# Patient Record
Sex: Male | Born: 1945
Health system: Southern US, Community
[De-identification: ages and names within clinical notes are randomized; demographics above are authoritative.]

## PROBLEM LIST (undated history)

## (undated) DIAGNOSIS — K5790 Diverticulosis of intestine, part unspecified, without perforation or abscess without bleeding: Secondary | ICD-10-CM

## (undated) DIAGNOSIS — I10 Essential (primary) hypertension: Secondary | ICD-10-CM

## (undated) DIAGNOSIS — Z9489 Other transplanted organ and tissue status: Secondary | ICD-10-CM

## (undated) DIAGNOSIS — I4510 Unspecified right bundle-branch block: Secondary | ICD-10-CM

## (undated) DIAGNOSIS — N182 Chronic kidney disease, stage 2 (mild): Secondary | ICD-10-CM

## (undated) DIAGNOSIS — T8609 Other complications of bone marrow transplant: Secondary | ICD-10-CM

## (undated) DIAGNOSIS — R768 Other specified abnormal immunological findings in serum: Secondary | ICD-10-CM

## (undated) DIAGNOSIS — E785 Hyperlipidemia, unspecified: Secondary | ICD-10-CM

## (undated) DIAGNOSIS — G729 Myopathy, unspecified: Secondary | ICD-10-CM

## (undated) DIAGNOSIS — E099 Drug or chemical induced diabetes mellitus without complications: Secondary | ICD-10-CM

## (undated) DIAGNOSIS — G252 Other specified forms of tremor: Secondary | ICD-10-CM

## (undated) DIAGNOSIS — M25552 Pain in left hip: Secondary | ICD-10-CM

## (undated) DIAGNOSIS — E669 Obesity, unspecified: Secondary | ICD-10-CM

## (undated) DIAGNOSIS — D89811 Chronic graft-versus-host disease: Secondary | ICD-10-CM

## (undated) DIAGNOSIS — K449 Diaphragmatic hernia without obstruction or gangrene: Secondary | ICD-10-CM

## (undated) DIAGNOSIS — C9110 Chronic lymphocytic leukemia of B-cell type not having achieved remission: Secondary | ICD-10-CM

## (undated) DIAGNOSIS — D61818 Other pancytopenia: Secondary | ICD-10-CM

## (undated) DIAGNOSIS — R197 Diarrhea, unspecified: Secondary | ICD-10-CM

## (undated) DIAGNOSIS — R894 Abnormal immunological findings in specimens from other organs, systems and tissues: Secondary | ICD-10-CM

## (undated) DIAGNOSIS — R21 Rash and other nonspecific skin eruption: Secondary | ICD-10-CM

## (undated) DIAGNOSIS — T380X5A Adverse effect of glucocorticoids and synthetic analogues, initial encounter: Secondary | ICD-10-CM

## (undated) DIAGNOSIS — I4891 Unspecified atrial fibrillation: Secondary | ICD-10-CM

## (undated) HISTORY — DX: Chronic lymphocytic leukemia of B-cell type not having achieved remission: C91.10

## (undated) HISTORY — PX: TONSILLECTOMY AND ADENOIDECTOMY: SUR1326

## (undated) HISTORY — DX: Essential (primary) hypertension: I10

## (undated) HISTORY — DX: Hyperlipidemia, unspecified: E78.5

## (undated) HISTORY — DX: Other pancytopenia: D61.818

## (undated) HISTORY — DX: Unspecified atrial fibrillation: I48.91

## (undated) HISTORY — DX: Diaphragmatic hernia without obstruction or gangrene: K44.9

## (undated) HISTORY — DX: Abnormal immunological findings in specimens from other organs, systems and tissues: R89.4

## (undated) HISTORY — DX: Other specified forms of tremor: G25.2

## (undated) HISTORY — DX: Adverse effect of glucocorticoids and synthetic analogues, initial encounter: T38.0X5A

## (undated) HISTORY — DX: Pain in left hip: M25.552

## (undated) HISTORY — DX: Other specified abnormal immunological findings in serum: R76.8

## (undated) HISTORY — PX: BONE MARROW TRANSPLANT: SHX200

## (undated) HISTORY — DX: Drug or chemical induced diabetes mellitus without complications: E09.9

## (undated) HISTORY — DX: Hypomagnesemia: E83.42

## (undated) HISTORY — DX: Chronic kidney disease, stage 2 (mild): N18.2

## (undated) HISTORY — DX: Myopathy, unspecified: G72.9

## (undated) HISTORY — DX: Diverticulosis of intestine, part unspecified, without perforation or abscess without bleeding: K57.90

## (undated) HISTORY — DX: Unspecified right bundle-branch block: I45.10

## (undated) HISTORY — DX: Other transplanted organ and tissue status: Z94.89

## (undated) HISTORY — DX: Obesity, unspecified: E66.9

---

## 2006-10-29 ENCOUNTER — Ambulatory Visit: Payer: Self-pay | Admitting: Oncology

## 2006-11-27 ENCOUNTER — Encounter: Payer: Self-pay | Admitting: Oncology

## 2006-11-27 ENCOUNTER — Other Ambulatory Visit: Admission: RE | Admit: 2006-11-27 | Discharge: 2006-11-27 | Payer: Self-pay | Admitting: Oncology

## 2006-11-27 LAB — CBC WITH DIFFERENTIAL/PLATELET
EOS%: 1.6 % (ref 0.0–7.0)
LYMPH%: 12.8 % — ABNORMAL LOW (ref 14.0–48.0)
MCH: 30.1 pg (ref 28.0–33.4)
MCHC: 35 g/dL (ref 32.0–35.9)
MCV: 86.1 fL (ref 81.6–98.0)
MONO%: 12.7 % (ref 0.0–13.0)
Platelets: 117 10*3/uL — ABNORMAL LOW (ref 145–400)
RBC: 4.57 10*6/uL (ref 4.20–5.71)
RDW: 11.8 % (ref 11.2–14.6)

## 2006-11-27 LAB — MORPHOLOGY: PLT EST: DECREASED

## 2006-12-02 LAB — COMPREHENSIVE METABOLIC PANEL
AST: 16 U/L (ref 0–37)
Alkaline Phosphatase: 69 U/L (ref 39–117)
BUN: 16 mg/dL (ref 6–23)
Creatinine, Ser: 0.78 mg/dL (ref 0.40–1.50)

## 2006-12-02 LAB — LACTATE DEHYDROGENASE: LDH: 167 U/L (ref 94–250)

## 2006-12-02 LAB — BETA 2 MICROGLOBULIN, SERUM: Beta-2 Microglobulin: 1.9 mg/L — ABNORMAL HIGH (ref 1.01–1.73)

## 2006-12-02 LAB — IMMUNOFIXATION ELECTROPHORESIS
IgA: 44 mg/dL — ABNORMAL LOW (ref 68–378)
IgM, Serum: 11 mg/dL — ABNORMAL LOW (ref 60–263)

## 2007-03-26 ENCOUNTER — Ambulatory Visit: Payer: Self-pay | Admitting: Oncology

## 2007-03-31 LAB — CBC WITH DIFFERENTIAL/PLATELET
Eosinophils Absolute: 0.1 10*3/uL (ref 0.0–0.5)
MONO#: 0.5 10*3/uL (ref 0.1–0.9)
MONO%: 10 % (ref 0.0–13.0)
NEUT#: 3.4 10*3/uL (ref 1.5–6.5)
RBC: 4.48 10*6/uL (ref 4.20–5.71)
RDW: 11.4 % (ref 11.2–14.6)
WBC: 4.7 10*3/uL (ref 4.0–10.0)
lymph#: 0.8 10*3/uL — ABNORMAL LOW (ref 0.9–3.3)

## 2007-11-14 ENCOUNTER — Ambulatory Visit: Payer: Self-pay | Admitting: Oncology

## 2007-11-17 LAB — CBC WITH DIFFERENTIAL/PLATELET
Eosinophils Absolute: 0.1 10*3/uL (ref 0.0–0.5)
HCT: 39.6 % (ref 38.7–49.9)
LYMPH%: 18.4 % (ref 14.0–48.0)
MCV: 85.7 fL (ref 81.6–98.0)
MONO#: 0.5 10*3/uL (ref 0.1–0.9)
MONO%: 9.3 % (ref 0.0–13.0)
NEUT#: 3.8 10*3/uL (ref 1.5–6.5)
NEUT%: 70.5 % (ref 40.0–75.0)
Platelets: 135 10*3/uL — ABNORMAL LOW (ref 145–400)
WBC: 5.4 10*3/uL (ref 4.0–10.0)

## 2007-11-23 LAB — LACTATE DEHYDROGENASE: LDH: 117 U/L (ref 94–250)

## 2007-11-23 LAB — COMPREHENSIVE METABOLIC PANEL
BUN: 19 mg/dL (ref 6–23)
CO2: 23 mEq/L (ref 19–32)
Creatinine, Ser: 0.89 mg/dL (ref 0.40–1.50)
Glucose, Bld: 99 mg/dL (ref 70–99)
Total Bilirubin: 0.7 mg/dL (ref 0.3–1.2)
Total Protein: 6.4 g/dL (ref 6.0–8.3)

## 2007-11-23 LAB — IMMUNOFIXATION ELECTROPHORESIS
IgA: 53 mg/dL — ABNORMAL LOW (ref 68–378)
IgG (Immunoglobin G), Serum: 584 mg/dL — ABNORMAL LOW (ref 694–1618)
IgM, Serum: 79 mg/dL (ref 60–263)
Total Protein, Serum Electrophoresis: 6.4 g/dL (ref 6.0–8.3)

## 2008-05-05 ENCOUNTER — Ambulatory Visit: Payer: Self-pay | Admitting: Oncology

## 2008-11-13 ENCOUNTER — Ambulatory Visit: Payer: Self-pay | Admitting: Oncology

## 2008-11-15 LAB — CBC WITH DIFFERENTIAL/PLATELET
BASO%: 0.4 % (ref 0.0–2.0)
Eosinophils Absolute: 0.1 10*3/uL (ref 0.0–0.5)
MCHC: 34.8 g/dL (ref 32.0–35.9)
MONO#: 0.5 10*3/uL (ref 0.1–0.9)
NEUT#: 3.4 10*3/uL (ref 1.5–6.5)
Platelets: 117 10*3/uL — ABNORMAL LOW (ref 145–400)
RBC: 4.57 10*6/uL (ref 4.20–5.71)
RDW: 13.3 % (ref 11.2–14.6)
WBC: 6.2 10*3/uL (ref 4.0–10.0)
lymph#: 2.2 10*3/uL (ref 0.9–3.3)

## 2008-11-15 LAB — MORPHOLOGY: PLT EST: DECREASED

## 2008-11-17 LAB — URIC ACID: Uric Acid, Serum: 6.2 mg/dL (ref 4.0–7.8)

## 2008-11-17 LAB — COMPREHENSIVE METABOLIC PANEL
ALT: 16 U/L (ref 0–53)
BUN: 20 mg/dL (ref 6–23)
CO2: 25 mEq/L (ref 19–32)
Creatinine, Ser: 0.95 mg/dL (ref 0.40–1.50)
Glucose, Bld: 106 mg/dL — ABNORMAL HIGH (ref 70–99)
Total Bilirubin: 0.6 mg/dL (ref 0.3–1.2)

## 2008-11-17 LAB — LACTATE DEHYDROGENASE: LDH: 119 U/L (ref 94–250)

## 2008-11-17 LAB — IGG, IGA, IGM
IgA: 36 mg/dL — ABNORMAL LOW (ref 68–378)
IgG (Immunoglobin G), Serum: 530 mg/dL — ABNORMAL LOW (ref 694–1618)
IgM, Serum: 97 mg/dL (ref 60–263)

## 2009-09-18 ENCOUNTER — Ambulatory Visit: Payer: Self-pay

## 2009-09-18 ENCOUNTER — Encounter: Payer: Self-pay | Admitting: Cardiology

## 2009-09-18 ENCOUNTER — Encounter (INDEPENDENT_AMBULATORY_CARE_PROVIDER_SITE_OTHER): Payer: Self-pay | Admitting: Internal Medicine

## 2009-10-15 ENCOUNTER — Ambulatory Visit: Payer: Self-pay | Admitting: Oncology

## 2009-10-17 LAB — CBC WITH DIFFERENTIAL/PLATELET
EOS%: 0 % (ref 0.0–7.0)
Eosinophils Absolute: 0 10*3/uL (ref 0.0–0.5)
MCH: 31.6 pg (ref 27.2–33.4)
MCV: 96.5 fL (ref 79.3–98.0)
MONO%: 2.2 % (ref 0.0–14.0)
NEUT#: 4.7 10*3/uL (ref 1.5–6.5)
RBC: 4.02 10*6/uL — ABNORMAL LOW (ref 4.20–5.82)
RDW: 15.9 % — ABNORMAL HIGH (ref 11.0–14.6)

## 2009-10-17 LAB — MORPHOLOGY

## 2009-10-18 LAB — COMPREHENSIVE METABOLIC PANEL
ALT: 17 U/L (ref 0–53)
AST: 15 U/L (ref 0–37)
Alkaline Phosphatase: 66 U/L (ref 39–117)
Calcium: 8.8 mg/dL (ref 8.4–10.5)
Chloride: 106 mEq/L (ref 96–112)
Creatinine, Ser: 0.98 mg/dL (ref 0.40–1.50)

## 2009-10-18 LAB — IGG, IGA, IGM
IgA: 28 mg/dL — ABNORMAL LOW (ref 68–378)
IgG (Immunoglobin G), Serum: 383 mg/dL — ABNORMAL LOW (ref 694–1618)
IgM, Serum: 26 mg/dL — ABNORMAL LOW (ref 60–263)

## 2009-11-01 LAB — CBC WITH DIFFERENTIAL/PLATELET
Basophils Absolute: 0.3 10*3/uL — ABNORMAL HIGH (ref 0.0–0.1)
Eosinophils Absolute: 0.1 10*3/uL (ref 0.0–0.5)
HCT: 40.1 % (ref 38.4–49.9)
HGB: 13.2 g/dL (ref 13.0–17.1)
MCH: 31.1 pg (ref 27.2–33.4)
NEUT#: 3.5 10*3/uL (ref 1.5–6.5)
NEUT%: 2.8 % — ABNORMAL LOW (ref 39.0–75.0)
RDW: 14.8 % — ABNORMAL HIGH (ref 11.0–14.6)
lymph#: 116.4 10*3/uL — ABNORMAL HIGH (ref 0.9–3.3)

## 2009-11-08 LAB — CBC WITH DIFFERENTIAL/PLATELET
Basophils Absolute: 0 10*3/uL (ref 0.0–0.1)
EOS%: 0.7 % (ref 0.0–7.0)
HCT: 39.5 % (ref 38.4–49.9)
HGB: 13.4 g/dL (ref 13.0–17.1)
LYMPH%: 72.1 % — ABNORMAL HIGH (ref 14.0–49.0)
MCH: 31.1 pg (ref 27.2–33.4)
MCV: 91.6 fL (ref 79.3–98.0)
MONO%: 4 % (ref 0.0–14.0)
NEUT%: 23.1 % — ABNORMAL LOW (ref 39.0–75.0)
Platelets: 102 10*3/uL — ABNORMAL LOW (ref 140–400)
lymph#: 9.8 10*3/uL — ABNORMAL HIGH (ref 0.9–3.3)

## 2009-11-08 LAB — COMPREHENSIVE METABOLIC PANEL
Albumin: 4.2 g/dL (ref 3.5–5.2)
Alkaline Phosphatase: 79 U/L (ref 39–117)
BUN: 19 mg/dL (ref 6–23)
Creatinine, Ser: 0.87 mg/dL (ref 0.40–1.50)
Glucose, Bld: 83 mg/dL (ref 70–99)
Potassium: 3.9 mEq/L (ref 3.5–5.3)

## 2009-11-20 ENCOUNTER — Ambulatory Visit: Payer: Self-pay | Admitting: Oncology

## 2009-11-22 LAB — CBC WITH DIFFERENTIAL/PLATELET
BASO%: 0.1 % (ref 0.0–2.0)
EOS%: 0.6 % (ref 0.0–7.0)
LYMPH%: 65.1 % — ABNORMAL HIGH (ref 14.0–49.0)
MCH: 31.4 pg (ref 27.2–33.4)
MCHC: 34.4 g/dL (ref 32.0–36.0)
MONO#: 0.6 10*3/uL (ref 0.1–0.9)
Platelets: 104 10*3/uL — ABNORMAL LOW (ref 140–400)
RBC: 4.37 10*6/uL (ref 4.20–5.82)
WBC: 11 10*3/uL — ABNORMAL HIGH (ref 4.0–10.3)
nRBC: 0 % (ref 0–0)

## 2009-11-29 LAB — CBC WITH DIFFERENTIAL/PLATELET
BASO%: 0.2 % (ref 0.0–2.0)
Basophils Absolute: 0 10*3/uL (ref 0.0–0.1)
EOS%: 0.9 % (ref 0.0–7.0)
HGB: 13.3 g/dL (ref 13.0–17.1)
MCH: 31.7 pg (ref 27.2–33.4)
MCHC: 34.1 g/dL (ref 32.0–36.0)
MCV: 93.1 fL (ref 79.3–98.0)
MONO%: 8.3 % (ref 0.0–14.0)
RBC: 4.19 10*6/uL — ABNORMAL LOW (ref 4.20–5.82)
RDW: 14 % (ref 11.0–14.6)
lymph#: 5.5 10*3/uL — ABNORMAL HIGH (ref 0.9–3.3)

## 2009-11-29 LAB — TECHNOLOGIST REVIEW

## 2009-12-06 LAB — CBC WITH DIFFERENTIAL/PLATELET
BASO%: 0.3 % (ref 0.0–2.0)
Eosinophils Absolute: 0.1 10*3/uL (ref 0.0–0.5)
LYMPH%: 52.7 % — ABNORMAL HIGH (ref 14.0–49.0)
MCHC: 34.4 g/dL (ref 32.0–36.0)
MONO#: 0.5 10*3/uL (ref 0.1–0.9)
NEUT#: 3.2 10*3/uL (ref 1.5–6.5)
RBC: 4.28 10*6/uL (ref 4.20–5.82)
RDW: 14.1 % (ref 11.0–14.6)
WBC: 7.9 10*3/uL (ref 4.0–10.3)
lymph#: 4.2 10*3/uL — ABNORMAL HIGH (ref 0.9–3.3)
nRBC: 0 % (ref 0–0)

## 2009-12-18 ENCOUNTER — Ambulatory Visit: Payer: Self-pay | Admitting: Oncology

## 2009-12-20 LAB — CBC WITH DIFFERENTIAL/PLATELET
BASO%: 0.2 % (ref 0.0–2.0)
Basophils Absolute: 0 10*3/uL (ref 0.0–0.1)
EOS%: 0.9 % (ref 0.0–7.0)
HCT: 39.3 % (ref 38.4–49.9)
HGB: 13.3 g/dL (ref 13.0–17.1)
MCH: 31.1 pg (ref 27.2–33.4)
MONO#: 0.6 10*3/uL (ref 0.1–0.9)
NEUT%: 41.4 % (ref 39.0–75.0)
RDW: 13.5 % (ref 11.0–14.6)
WBC: 6.5 10*3/uL (ref 4.0–10.3)
lymph#: 3.1 10*3/uL (ref 0.9–3.3)

## 2009-12-28 LAB — MORPHOLOGY
PLT EST: DECREASED
RBC Comments: NORMAL

## 2009-12-28 LAB — COMPREHENSIVE METABOLIC PANEL
ALT: 15 U/L (ref 0–53)
AST: 14 U/L (ref 0–37)
Albumin: 4.2 g/dL (ref 3.5–5.2)
Alkaline Phosphatase: 57 U/L (ref 39–117)
BUN: 19 mg/dL (ref 6–23)
CO2: 19 mEq/L (ref 19–32)
Calcium: 8.9 mg/dL (ref 8.4–10.5)
Chloride: 107 mEq/L (ref 96–112)
Creatinine, Ser: 0.76 mg/dL (ref 0.40–1.50)
Glucose, Bld: 94 mg/dL (ref 70–99)
Potassium: 3.9 mEq/L (ref 3.5–5.3)
Sodium: 142 mEq/L (ref 135–145)
Total Bilirubin: 0.7 mg/dL (ref 0.3–1.2)
Total Protein: 5.8 g/dL — ABNORMAL LOW (ref 6.0–8.3)

## 2009-12-28 LAB — CBC WITH DIFFERENTIAL/PLATELET
BASO%: 0.3 % (ref 0.0–2.0)
Basophils Absolute: 0 10*3/uL (ref 0.0–0.1)
EOS%: 1.1 % (ref 0.0–7.0)
Eosinophils Absolute: 0.1 10*3/uL (ref 0.0–0.5)
HCT: 39.6 % (ref 38.4–49.9)
HGB: 13.6 g/dL (ref 13.0–17.1)
LYMPH%: 48.3 % (ref 14.0–49.0)
MCH: 31.3 pg (ref 27.2–33.4)
MCHC: 34.3 g/dL (ref 32.0–36.0)
MCV: 91 fL (ref 79.3–98.0)
MONO#: 0.8 10*3/uL (ref 0.1–0.9)
MONO%: 12.5 % (ref 0.0–14.0)
NEUT#: 2.4 10*3/uL (ref 1.5–6.5)
NEUT%: 37.8 % — ABNORMAL LOW (ref 39.0–75.0)
Platelets: 100 10*3/uL — ABNORMAL LOW (ref 140–400)
RBC: 4.35 10*6/uL (ref 4.20–5.82)
RDW: 13 % (ref 11.0–14.6)
WBC: 6.3 10*3/uL (ref 4.0–10.3)
lymph#: 3.1 10*3/uL (ref 0.9–3.3)

## 2009-12-28 LAB — URIC ACID: Uric Acid, Serum: 6.7 mg/dL (ref 4.0–7.8)

## 2009-12-28 LAB — LACTATE DEHYDROGENASE: LDH: 126 U/L (ref 94–250)

## 2009-12-28 LAB — CHCC SMEAR

## 2010-01-04 LAB — CBC WITH DIFFERENTIAL/PLATELET
BASO%: 0.3 % (ref 0.0–2.0)
Basophils Absolute: 0 10*3/uL (ref 0.0–0.1)
EOS%: 1.2 % (ref 0.0–7.0)
Eosinophils Absolute: 0.1 10*3/uL (ref 0.0–0.5)
HCT: 39.3 % (ref 38.4–49.9)
HGB: 13.9 g/dL (ref 13.0–17.1)
LYMPH%: 41.2 % (ref 14.0–49.0)
MCH: 32.7 pg (ref 27.2–33.4)
MCHC: 35.4 g/dL (ref 32.0–36.0)
MCV: 92.5 fL (ref 79.3–98.0)
MONO#: 0.7 10*3/uL (ref 0.1–0.9)
MONO%: 11.5 % (ref 0.0–14.0)
NEUT#: 3 10*3/uL (ref 1.5–6.5)
NEUT%: 45.8 % (ref 39.0–75.0)
Platelets: 103 10*3/uL — ABNORMAL LOW (ref 140–400)
RBC: 4.25 10*6/uL (ref 4.20–5.82)
RDW: 13.1 % (ref 11.0–14.6)
WBC: 6.5 10*3/uL (ref 4.0–10.3)
lymph#: 2.7 10*3/uL (ref 0.9–3.3)

## 2010-02-07 ENCOUNTER — Ambulatory Visit: Payer: Self-pay | Admitting: Oncology

## 2010-02-11 LAB — CBC WITH DIFFERENTIAL/PLATELET
BASO%: 0.1 % (ref 0.0–2.0)
EOS%: 0.8 % (ref 0.0–7.0)
HCT: 38.5 % (ref 38.4–49.9)
MCH: 31.6 pg (ref 27.2–33.4)
MCHC: 34.4 g/dL (ref 32.0–36.0)
MONO#: 0.7 10*3/uL (ref 0.1–0.9)
NEUT%: 43.1 % (ref 39.0–75.0)
RBC: 4.19 10*6/uL — ABNORMAL LOW (ref 4.20–5.82)
RDW: 13.3 % (ref 11.0–14.6)
WBC: 5.6 10*3/uL (ref 4.0–10.3)
lymph#: 2.5 10*3/uL (ref 0.9–3.3)

## 2010-02-11 LAB — MORPHOLOGY: RBC Comments: NORMAL

## 2010-02-13 LAB — COMPREHENSIVE METABOLIC PANEL
Albumin: 4.4 g/dL (ref 3.5–5.2)
Alkaline Phosphatase: 55 U/L (ref 39–117)
BUN: 21 mg/dL (ref 6–23)
Glucose, Bld: 90 mg/dL (ref 70–99)
Total Bilirubin: 0.6 mg/dL (ref 0.3–1.2)

## 2010-02-13 LAB — IMMUNOFIXATION ELECTROPHORESIS
IgA: 32 mg/dL — ABNORMAL LOW (ref 68–378)
IgM, Serum: 17 mg/dL — ABNORMAL LOW (ref 60–263)
Total Protein, Serum Electrophoresis: 5.9 g/dL — ABNORMAL LOW (ref 6.0–8.3)

## 2010-02-13 LAB — LACTATE DEHYDROGENASE: LDH: 104 U/L (ref 94–250)

## 2010-02-13 LAB — URIC ACID: Uric Acid, Serum: 6 mg/dL (ref 4.0–7.8)

## 2010-05-09 ENCOUNTER — Ambulatory Visit: Payer: Self-pay | Admitting: Oncology

## 2010-05-10 LAB — COMPREHENSIVE METABOLIC PANEL
ALT: 22 U/L (ref 0–53)
AST: 24 U/L (ref 0–37)
Alkaline Phosphatase: 59 U/L (ref 39–117)
BUN: 16 mg/dL (ref 6–23)
Calcium: 8.9 mg/dL (ref 8.4–10.5)
Chloride: 110 mEq/L (ref 96–112)
Creatinine, Ser: 0.84 mg/dL (ref 0.40–1.50)

## 2010-05-10 LAB — CBC WITH DIFFERENTIAL/PLATELET
BASO%: 0.1 % (ref 0.0–2.0)
Eosinophils Absolute: 0.1 10*3/uL (ref 0.0–0.5)
MCHC: 34.6 g/dL (ref 32.0–36.0)
MONO#: 0.6 10*3/uL (ref 0.1–0.9)
MONO%: 2.9 % (ref 0.0–14.0)
NEUT#: 3.2 10*3/uL (ref 1.5–6.5)
RBC: 4.63 10*6/uL (ref 4.20–5.82)
RDW: 14.2 % (ref 11.0–14.6)
WBC: 20.4 10*3/uL — ABNORMAL HIGH (ref 4.0–10.3)
nRBC: 0 % (ref 0–0)

## 2010-05-10 LAB — TECHNOLOGIST REVIEW

## 2010-05-14 LAB — IMMUNOFIXATION ELECTROPHORESIS
IgA: 245 mg/dL (ref 68–378)
IgG (Immunoglobin G), Serum: 881 mg/dL (ref 694–1618)
IgM, Serum: 65 mg/dL (ref 60–263)
Total Protein, Serum Electrophoresis: 6 g/dL (ref 6.0–8.3)

## 2010-06-27 ENCOUNTER — Ambulatory Visit: Payer: Self-pay | Admitting: Oncology

## 2010-07-01 LAB — COMPREHENSIVE METABOLIC PANEL
Albumin: 4.5 g/dL (ref 3.5–5.2)
Alkaline Phosphatase: 58 U/L (ref 39–117)
BUN: 23 mg/dL (ref 6–23)
CO2: 22 mEq/L (ref 19–32)
Glucose, Bld: 137 mg/dL — ABNORMAL HIGH (ref 70–99)
Potassium: 3.8 mEq/L (ref 3.5–5.3)
Sodium: 141 mEq/L (ref 135–145)
Total Bilirubin: 0.7 mg/dL (ref 0.3–1.2)
Total Protein: 6.3 g/dL (ref 6.0–8.3)

## 2010-07-01 LAB — CBC WITH DIFFERENTIAL/PLATELET
Basophils Absolute: 0.1 10*3/uL (ref 0.0–0.1)
EOS%: 0.2 % (ref 0.0–7.0)
Eosinophils Absolute: 0.1 10*3/uL (ref 0.0–0.5)
HCT: 40.5 % (ref 38.4–49.9)
HGB: 13.7 g/dL (ref 13.0–17.1)
MCH: 30.9 pg (ref 27.2–33.4)
MCV: 91.4 fL (ref 79.3–98.0)
MONO%: 1.7 % (ref 0.0–14.0)
NEUT#: 3.8 10*3/uL (ref 1.5–6.5)
NEUT%: 6.2 % — ABNORMAL LOW (ref 39.0–75.0)

## 2010-07-01 LAB — LACTATE DEHYDROGENASE: LDH: 107 U/L (ref 94–250)

## 2010-07-01 LAB — MORPHOLOGY

## 2010-07-01 LAB — URIC ACID: Uric Acid, Serum: 5.8 mg/dL (ref 4.0–7.8)

## 2010-07-11 LAB — CBC WITH DIFFERENTIAL/PLATELET
BASO%: 0.1 % (ref 0.0–2.0)
EOS%: 0.2 % (ref 0.0–7.0)
Eosinophils Absolute: 0.1 10*3/uL (ref 0.0–0.5)
MCH: 30.8 pg (ref 27.2–33.4)
MCHC: 33.1 g/dL (ref 32.0–36.0)
MCV: 93.2 fL (ref 79.3–98.0)
MONO%: 1.7 % (ref 0.0–14.0)
NEUT#: 4.1 10*3/uL (ref 1.5–6.5)
RBC: 4.38 10*6/uL (ref 4.20–5.82)
RDW: 14.9 % — ABNORMAL HIGH (ref 11.0–14.6)

## 2010-07-11 LAB — TECHNOLOGIST REVIEW

## 2010-07-18 LAB — CBC WITH DIFFERENTIAL/PLATELET
BASO%: 0.1 % (ref 0.0–2.0)
EOS%: 0.3 % (ref 0.0–7.0)
MCHC: 34 g/dL (ref 32.0–36.0)
MONO#: 0.7 10*3/uL (ref 0.1–0.9)
RBC: 4.34 10*6/uL (ref 4.20–5.82)
RDW: 14.7 % — ABNORMAL HIGH (ref 11.0–14.6)
WBC: 47 10*3/uL — ABNORMAL HIGH (ref 4.0–10.3)
lymph#: 42.8 10*3/uL — ABNORMAL HIGH (ref 0.9–3.3)
nRBC: 0 % (ref 0–0)

## 2010-07-23 LAB — CBC WITH DIFFERENTIAL/PLATELET
Basophils Absolute: 0 10*3/uL (ref 0.0–0.1)
EOS%: 0.5 % (ref 0.0–7.0)
LYMPH%: 85.4 % — ABNORMAL HIGH (ref 14.0–49.0)
MCH: 32.1 pg (ref 27.2–33.4)
MCV: 92.5 fL (ref 79.3–98.0)
MONO%: 0.9 % (ref 0.0–14.0)
Platelets: 110 10*3/uL — ABNORMAL LOW (ref 140–400)
RBC: 4.21 10*6/uL (ref 4.20–5.82)
RDW: 15.7 % — ABNORMAL HIGH (ref 11.0–14.6)

## 2010-07-25 LAB — CBC WITH DIFFERENTIAL/PLATELET
BASO%: 0.1 % (ref 0.0–2.0)
EOS%: 0.4 % (ref 0.0–7.0)
HCT: 40.6 % (ref 38.4–49.9)
LYMPH%: 88.3 % — ABNORMAL HIGH (ref 14.0–49.0)
MCH: 31.6 pg (ref 27.2–33.4)
MCHC: 34.2 g/dL (ref 32.0–36.0)
NEUT%: 9 % — ABNORMAL LOW (ref 39.0–75.0)
Platelets: 92 10*3/uL — ABNORMAL LOW (ref 140–400)

## 2010-07-29 ENCOUNTER — Ambulatory Visit: Payer: Self-pay | Admitting: Oncology

## 2010-07-31 LAB — COMPREHENSIVE METABOLIC PANEL
ALT: 19 U/L (ref 0–53)
AST: 16 U/L (ref 0–37)
Calcium: 9.3 mg/dL (ref 8.4–10.5)
Chloride: 107 mEq/L (ref 96–112)
Creatinine, Ser: 0.94 mg/dL (ref 0.40–1.50)
Potassium: 3.9 mEq/L (ref 3.5–5.3)

## 2010-07-31 LAB — CBC WITH DIFFERENTIAL/PLATELET
BASO%: 0.4 % (ref 0.0–2.0)
EOS%: 0.6 % (ref 0.0–7.0)
MCH: 32.1 pg (ref 27.2–33.4)
MCHC: 34.4 g/dL (ref 32.0–36.0)
RDW: 15.2 % — ABNORMAL HIGH (ref 11.0–14.6)
lymph#: 12.1 10*3/uL — ABNORMAL HIGH (ref 0.9–3.3)

## 2010-08-08 LAB — CBC WITH DIFFERENTIAL/PLATELET
BASO%: 0.1 % (ref 0.0–2.0)
EOS%: 0.5 % (ref 0.0–7.0)
HCT: 39.7 % (ref 38.4–49.9)
LYMPH%: 79.2 % — ABNORMAL HIGH (ref 14.0–49.0)
MCH: 31.5 pg (ref 27.2–33.4)
MCHC: 34 g/dL (ref 32.0–36.0)
MCV: 92.5 fL (ref 79.3–98.0)
MONO%: 4.6 % (ref 0.0–14.0)
NEUT%: 15.6 % — ABNORMAL LOW (ref 39.0–75.0)
lymph#: 7.4 10*3/uL — ABNORMAL HIGH (ref 0.9–3.3)

## 2010-08-15 LAB — CBC WITH DIFFERENTIAL/PLATELET
BASO%: 0.1 % (ref 0.0–2.0)
EOS%: 0.4 % (ref 0.0–7.0)
HGB: 13.7 g/dL (ref 13.0–17.1)
MCH: 31.9 pg (ref 27.2–33.4)
MCHC: 34.6 g/dL (ref 32.0–36.0)
MONO%: 2.9 % (ref 0.0–14.0)
RBC: 4.3 10*6/uL (ref 4.20–5.82)
RDW: 14.9 % — ABNORMAL HIGH (ref 11.0–14.6)
lymph#: 8 10*3/uL — ABNORMAL HIGH (ref 0.9–3.3)
nRBC: 0 % (ref 0–0)

## 2010-08-22 LAB — CBC WITH DIFFERENTIAL/PLATELET
Basophils Absolute: 0 10*3/uL (ref 0.0–0.1)
Eosinophils Absolute: 0.1 10*3/uL (ref 0.0–0.5)
HCT: 39.4 % (ref 38.4–49.9)
HGB: 13.5 g/dL (ref 13.0–17.1)
MCV: 93.1 fL (ref 79.3–98.0)
MONO%: 5.4 % (ref 0.0–14.0)
NEUT#: 1.5 10*3/uL (ref 1.5–6.5)
NEUT%: 18.2 % — ABNORMAL LOW (ref 39.0–75.0)
RDW: 14.9 % — ABNORMAL HIGH (ref 11.0–14.6)
lymph#: 6.2 10*3/uL — ABNORMAL HIGH (ref 0.9–3.3)

## 2010-08-28 ENCOUNTER — Ambulatory Visit: Payer: Self-pay | Admitting: Oncology

## 2010-08-29 LAB — CBC WITH DIFFERENTIAL/PLATELET
BASO%: 0.3 % (ref 0.0–2.0)
EOS%: 0.7 % (ref 0.0–7.0)
HCT: 38.9 % (ref 38.4–49.9)
LYMPH%: 76 % — ABNORMAL HIGH (ref 14.0–49.0)
MCH: 32.2 pg (ref 27.2–33.4)
MCHC: 34.4 g/dL (ref 32.0–36.0)
MCV: 93.5 fL (ref 79.3–98.0)
MONO%: 5.8 % (ref 0.0–14.0)
NEUT%: 17.2 % — ABNORMAL LOW (ref 39.0–75.0)
Platelets: 80 10*3/uL — ABNORMAL LOW (ref 140–400)

## 2010-09-05 LAB — CBC WITH DIFFERENTIAL/PLATELET
Basophils Absolute: 0 10*3/uL (ref 0.0–0.1)
EOS%: 0.8 % (ref 0.0–7.0)
HGB: 12.8 g/dL — ABNORMAL LOW (ref 13.0–17.1)
MCH: 32.4 pg (ref 27.2–33.4)
MCV: 94.7 fL (ref 79.3–98.0)
MONO%: 7.1 % (ref 0.0–14.0)
RBC: 3.96 10*6/uL — ABNORMAL LOW (ref 4.20–5.82)
RDW: 15.2 % — ABNORMAL HIGH (ref 11.0–14.6)

## 2010-09-05 LAB — COMPREHENSIVE METABOLIC PANEL
AST: 17 U/L (ref 0–37)
Albumin: 4.2 g/dL (ref 3.5–5.2)
Alkaline Phosphatase: 54 U/L (ref 39–117)
BUN: 19 mg/dL (ref 6–23)
Creatinine, Ser: 0.87 mg/dL (ref 0.40–1.50)
Potassium: 3.9 mEq/L (ref 3.5–5.3)

## 2010-09-19 LAB — CBC WITH DIFFERENTIAL/PLATELET
BASO%: 0.1 % (ref 0.0–2.0)
EOS%: 1 % (ref 0.0–7.0)
HCT: 36.5 % — ABNORMAL LOW (ref 38.4–49.9)
LYMPH%: 64.1 % — ABNORMAL HIGH (ref 14.0–49.0)
MCH: 32.6 pg (ref 27.2–33.4)
MCHC: 35.3 g/dL (ref 32.0–36.0)
MCV: 92.2 fL (ref 79.3–98.0)
NEUT%: 23.8 % — ABNORMAL LOW (ref 39.0–75.0)
Platelets: 69 10*3/uL — ABNORMAL LOW (ref 140–400)

## 2010-09-19 LAB — MORPHOLOGY

## 2010-10-08 ENCOUNTER — Ambulatory Visit: Payer: Self-pay | Admitting: Oncology

## 2010-10-10 LAB — CBC WITH DIFFERENTIAL/PLATELET
BASO%: 0.5 % (ref 0.0–2.0)
Basophils Absolute: 0 10*3/uL (ref 0.0–0.1)
EOS%: 1.2 % (ref 0.0–7.0)
Eosinophils Absolute: 0.1 10*3/uL (ref 0.0–0.5)
HCT: 36.8 % — ABNORMAL LOW (ref 38.4–49.9)
HGB: 12.8 g/dL — ABNORMAL LOW (ref 13.0–17.1)
LYMPH%: 53.8 % — ABNORMAL HIGH (ref 14.0–49.0)
MCH: 33.2 pg (ref 27.2–33.4)
MCHC: 34.7 g/dL (ref 32.0–36.0)
MCV: 95.7 fL (ref 79.3–98.0)
MONO#: 0.5 10*3/uL (ref 0.1–0.9)
MONO%: 9.4 % (ref 0.0–14.0)
NEUT#: 1.9 10*3/uL (ref 1.5–6.5)
NEUT%: 35.1 % — ABNORMAL LOW (ref 39.0–75.0)
Platelets: 89 10*3/uL — ABNORMAL LOW (ref 140–400)
RBC: 3.85 10*6/uL — ABNORMAL LOW (ref 4.20–5.82)
RDW: 14.1 % (ref 11.0–14.6)
WBC: 5.5 10*3/uL (ref 4.0–10.3)
lymph#: 2.9 10*3/uL (ref 0.9–3.3)

## 2010-10-29 LAB — CBC WITH DIFFERENTIAL/PLATELET
BASO%: 0.3 % (ref 0.0–2.0)
EOS%: 1 % (ref 0.0–7.0)
LYMPH%: 53.3 % — ABNORMAL HIGH (ref 14.0–49.0)
MCH: 33.3 pg (ref 27.2–33.4)
MCHC: 34.8 g/dL (ref 32.0–36.0)
MCV: 95.6 fL (ref 79.3–98.0)
MONO#: 0.7 10*3/uL (ref 0.1–0.9)
MONO%: 10.5 % (ref 0.0–14.0)
NEUT%: 34.9 % — ABNORMAL LOW (ref 39.0–75.0)
Platelets: 91 10*3/uL — ABNORMAL LOW (ref 140–400)
RBC: 3.9 10*6/uL — ABNORMAL LOW (ref 4.20–5.82)
WBC: 6.3 10*3/uL (ref 4.0–10.3)

## 2010-11-26 ENCOUNTER — Ambulatory Visit: Payer: Self-pay | Admitting: Oncology

## 2010-11-28 LAB — CBC WITH DIFFERENTIAL/PLATELET
Basophils Absolute: 0 10*3/uL (ref 0.0–0.1)
EOS%: 1.2 % (ref 0.0–7.0)
HGB: 13.3 g/dL (ref 13.0–17.1)
LYMPH%: 57.9 % — ABNORMAL HIGH (ref 14.0–49.0)
MCH: 31.7 pg (ref 27.2–33.4)
MCV: 90.2 fL (ref 79.3–98.0)
MONO%: 8.8 % (ref 0.0–14.0)
Platelets: 82 10*3/uL — ABNORMAL LOW (ref 140–400)
RDW: 12.7 % (ref 11.0–14.6)

## 2010-12-26 ENCOUNTER — Ambulatory Visit (HOSPITAL_BASED_OUTPATIENT_CLINIC_OR_DEPARTMENT_OTHER): Payer: BC Managed Care – PPO | Admitting: Oncology

## 2010-12-26 LAB — CBC WITH DIFFERENTIAL/PLATELET
BASO%: 0.3 % (ref 0.0–2.0)
Basophils Absolute: 0 10*3/uL (ref 0.0–0.1)
EOS%: 0.8 % (ref 0.0–7.0)
Eosinophils Absolute: 0.1 10*3/uL (ref 0.0–0.5)
HCT: 39.7 % (ref 38.4–49.9)
HGB: 14 g/dL (ref 13.0–17.1)
LYMPH%: 54.6 % — ABNORMAL HIGH (ref 14.0–49.0)
MCH: 31.6 pg (ref 27.2–33.4)
MCHC: 35.3 g/dL (ref 32.0–36.0)
MCV: 89.6 fL (ref 79.3–98.0)
MONO#: 0.7 10*3/uL (ref 0.1–0.9)
MONO%: 8.9 % (ref 0.0–14.0)
NEUT#: 2.8 10*3/uL (ref 1.5–6.5)
NEUT%: 35.4 % — ABNORMAL LOW (ref 39.0–75.0)
Platelets: 85 10*3/uL — ABNORMAL LOW (ref 140–400)
RBC: 4.43 10*6/uL (ref 4.20–5.82)
RDW: 12.8 % (ref 11.0–14.6)
WBC: 7.8 10*3/uL (ref 4.0–10.3)
lymph#: 4.3 10*3/uL — ABNORMAL HIGH (ref 0.9–3.3)
nRBC: 0 % (ref 0–0)

## 2010-12-26 LAB — TECHNOLOGIST REVIEW

## 2011-01-23 ENCOUNTER — Encounter: Payer: BC Managed Care – PPO | Admitting: Oncology

## 2011-01-23 DIAGNOSIS — C9110 Chronic lymphocytic leukemia of B-cell type not having achieved remission: Secondary | ICD-10-CM

## 2011-01-23 DIAGNOSIS — C8599 Non-Hodgkin lymphoma, unspecified, extranodal and solid organ sites: Secondary | ICD-10-CM

## 2011-01-23 LAB — CBC WITH DIFFERENTIAL/PLATELET
Eosinophils Absolute: 0.1 10*3/uL (ref 0.0–0.5)
HCT: 39.4 % (ref 38.4–49.9)
LYMPH%: 51 % — ABNORMAL HIGH (ref 14.0–49.0)
MCV: 92.5 fL (ref 79.3–98.0)
MONO#: 0.6 10*3/uL (ref 0.1–0.9)
NEUT#: 4.7 10*3/uL (ref 1.5–6.5)
NEUT%: 42.9 % (ref 39.0–75.0)
Platelets: 110 10*3/uL — ABNORMAL LOW (ref 140–400)
WBC: 10.9 10*3/uL — ABNORMAL HIGH (ref 4.0–10.3)

## 2011-02-20 ENCOUNTER — Encounter (HOSPITAL_BASED_OUTPATIENT_CLINIC_OR_DEPARTMENT_OTHER): Payer: BC Managed Care – PPO | Admitting: Oncology

## 2011-02-20 ENCOUNTER — Other Ambulatory Visit: Payer: Self-pay | Admitting: Oncology

## 2011-02-20 DIAGNOSIS — Z5112 Encounter for antineoplastic immunotherapy: Secondary | ICD-10-CM

## 2011-02-20 DIAGNOSIS — C9110 Chronic lymphocytic leukemia of B-cell type not having achieved remission: Secondary | ICD-10-CM

## 2011-02-20 DIAGNOSIS — C8599 Non-Hodgkin lymphoma, unspecified, extranodal and solid organ sites: Secondary | ICD-10-CM

## 2011-02-20 LAB — CBC WITH DIFFERENTIAL/PLATELET
BASO%: 0.3 % (ref 0.0–2.0)
EOS%: 0.7 % (ref 0.0–7.0)
MCH: 31.5 pg (ref 27.2–33.4)
MCHC: 35 g/dL (ref 32.0–36.0)
MONO#: 0.8 10*3/uL (ref 0.1–0.9)
RBC: 4.28 10*6/uL (ref 4.20–5.82)
RDW: 14.1 % (ref 11.0–14.6)
WBC: 10.4 10*3/uL — ABNORMAL HIGH (ref 4.0–10.3)
lymph#: 5.9 10*3/uL — ABNORMAL HIGH (ref 0.9–3.3)
nRBC: 0 % (ref 0–0)

## 2011-03-19 ENCOUNTER — Encounter (HOSPITAL_BASED_OUTPATIENT_CLINIC_OR_DEPARTMENT_OTHER): Payer: BC Managed Care – PPO | Admitting: Oncology

## 2011-03-19 ENCOUNTER — Other Ambulatory Visit: Payer: Self-pay | Admitting: Oncology

## 2011-03-19 DIAGNOSIS — C9110 Chronic lymphocytic leukemia of B-cell type not having achieved remission: Secondary | ICD-10-CM

## 2011-03-19 DIAGNOSIS — C8599 Non-Hodgkin lymphoma, unspecified, extranodal and solid organ sites: Secondary | ICD-10-CM

## 2011-03-19 LAB — CBC WITH DIFFERENTIAL/PLATELET
BASO%: 0.1 % (ref 0.0–2.0)
Eosinophils Absolute: 0.1 10*3/uL (ref 0.0–0.5)
LYMPH%: 59.9 % — ABNORMAL HIGH (ref 14.0–49.0)
MCHC: 35.3 g/dL (ref 32.0–36.0)
MONO#: 0.5 10*3/uL (ref 0.1–0.9)
MONO%: 6.4 % (ref 0.0–14.0)
NEUT#: 2.5 10*3/uL (ref 1.5–6.5)
Platelets: 100 10*3/uL — ABNORMAL LOW (ref 140–400)
RBC: 4.22 10*6/uL (ref 4.20–5.82)
RDW: 13.7 % (ref 11.0–14.6)
WBC: 7.7 10*3/uL (ref 4.0–10.3)

## 2011-04-17 ENCOUNTER — Other Ambulatory Visit: Payer: Self-pay | Admitting: Oncology

## 2011-04-17 ENCOUNTER — Encounter (HOSPITAL_BASED_OUTPATIENT_CLINIC_OR_DEPARTMENT_OTHER): Payer: BC Managed Care – PPO | Admitting: Oncology

## 2011-04-17 DIAGNOSIS — C8599 Non-Hodgkin lymphoma, unspecified, extranodal and solid organ sites: Secondary | ICD-10-CM

## 2011-04-17 DIAGNOSIS — C9110 Chronic lymphocytic leukemia of B-cell type not having achieved remission: Secondary | ICD-10-CM

## 2011-04-17 DIAGNOSIS — Z5112 Encounter for antineoplastic immunotherapy: Secondary | ICD-10-CM

## 2011-04-17 LAB — CBC WITH DIFFERENTIAL/PLATELET
BASO%: 0.2 % (ref 0.0–2.0)
Eosinophils Absolute: 0.1 10*3/uL (ref 0.0–0.5)
HCT: 40 % (ref 38.4–49.9)
MCHC: 34.5 g/dL (ref 32.0–36.0)
MONO#: 0.6 10*3/uL (ref 0.1–0.9)
NEUT#: 2.7 10*3/uL (ref 1.5–6.5)
NEUT%: 21.3 % — ABNORMAL LOW (ref 39.0–75.0)
RBC: 4.42 10*6/uL (ref 4.20–5.82)
WBC: 12.4 10*3/uL — ABNORMAL HIGH (ref 4.0–10.3)
lymph#: 9.1 10*3/uL — ABNORMAL HIGH (ref 0.9–3.3)

## 2011-05-22 ENCOUNTER — Other Ambulatory Visit: Payer: Self-pay | Admitting: Oncology

## 2011-05-22 ENCOUNTER — Encounter (HOSPITAL_BASED_OUTPATIENT_CLINIC_OR_DEPARTMENT_OTHER): Payer: BC Managed Care – PPO | Admitting: Oncology

## 2011-05-22 DIAGNOSIS — Z5112 Encounter for antineoplastic immunotherapy: Secondary | ICD-10-CM

## 2011-05-22 DIAGNOSIS — C8599 Non-Hodgkin lymphoma, unspecified, extranodal and solid organ sites: Secondary | ICD-10-CM

## 2011-05-22 DIAGNOSIS — C9110 Chronic lymphocytic leukemia of B-cell type not having achieved remission: Secondary | ICD-10-CM

## 2011-05-22 LAB — CHCC SMEAR

## 2011-05-22 LAB — CBC WITH DIFFERENTIAL/PLATELET
BASO%: 0.2 % (ref 0.0–2.0)
EOS%: 0.5 % (ref 0.0–7.0)
MCH: 32.7 pg (ref 27.2–33.4)
MCV: 95.3 fL (ref 79.3–98.0)
MONO%: 5.4 % (ref 0.0–14.0)
RBC: 4.05 10*6/uL — ABNORMAL LOW (ref 4.20–5.82)
RDW: 15.8 % — ABNORMAL HIGH (ref 11.0–14.6)

## 2011-05-22 LAB — MORPHOLOGY: PLT EST: DECREASED

## 2011-06-12 ENCOUNTER — Encounter (HOSPITAL_BASED_OUTPATIENT_CLINIC_OR_DEPARTMENT_OTHER): Payer: BC Managed Care – PPO | Admitting: Oncology

## 2011-06-12 ENCOUNTER — Other Ambulatory Visit: Payer: Self-pay | Admitting: Oncology

## 2011-06-12 DIAGNOSIS — Z5112 Encounter for antineoplastic immunotherapy: Secondary | ICD-10-CM

## 2011-06-12 DIAGNOSIS — C8599 Non-Hodgkin lymphoma, unspecified, extranodal and solid organ sites: Secondary | ICD-10-CM

## 2011-06-12 DIAGNOSIS — C9110 Chronic lymphocytic leukemia of B-cell type not having achieved remission: Secondary | ICD-10-CM

## 2011-06-12 DIAGNOSIS — Z5111 Encounter for antineoplastic chemotherapy: Secondary | ICD-10-CM

## 2011-06-12 LAB — CBC WITH DIFFERENTIAL/PLATELET
BASO%: 0.3 % (ref 0.0–2.0)
Basophils Absolute: 0 10*3/uL (ref 0.0–0.1)
EOS%: 1 % (ref 0.0–7.0)
HCT: 37.9 % — ABNORMAL LOW (ref 38.4–49.9)
HGB: 13.3 g/dL (ref 13.0–17.1)
LYMPH%: 75.4 % — ABNORMAL HIGH (ref 14.0–49.0)
MCH: 33.2 pg (ref 27.2–33.4)
MCHC: 35.3 g/dL (ref 32.0–36.0)
NEUT%: 20 % — ABNORMAL LOW (ref 39.0–75.0)
Platelets: 91 10*3/uL — ABNORMAL LOW (ref 140–400)

## 2011-06-12 LAB — MORPHOLOGY: PLT EST: DECREASED

## 2011-06-13 LAB — COMPREHENSIVE METABOLIC PANEL
ALT: 15 U/L (ref 0–53)
Albumin: 4.5 g/dL (ref 3.5–5.2)
Alkaline Phosphatase: 56 U/L (ref 39–117)
CO2: 25 mEq/L (ref 19–32)
Glucose, Bld: 87 mg/dL (ref 70–99)
Potassium: 4 mEq/L (ref 3.5–5.3)
Sodium: 140 mEq/L (ref 135–145)
Total Bilirubin: 0.7 mg/dL (ref 0.3–1.2)
Total Protein: 5.8 g/dL — ABNORMAL LOW (ref 6.0–8.3)

## 2011-06-13 LAB — IGG, IGA, IGM: IgM, Serum: 6 mg/dL — ABNORMAL LOW (ref 41–251)

## 2011-07-22 ENCOUNTER — Other Ambulatory Visit: Payer: Self-pay | Admitting: Oncology

## 2011-07-22 ENCOUNTER — Encounter (HOSPITAL_BASED_OUTPATIENT_CLINIC_OR_DEPARTMENT_OTHER): Payer: BC Managed Care – PPO | Admitting: Oncology

## 2011-07-22 DIAGNOSIS — C9110 Chronic lymphocytic leukemia of B-cell type not having achieved remission: Secondary | ICD-10-CM

## 2011-07-22 DIAGNOSIS — C8599 Non-Hodgkin lymphoma, unspecified, extranodal and solid organ sites: Secondary | ICD-10-CM

## 2011-07-22 DIAGNOSIS — Z5112 Encounter for antineoplastic immunotherapy: Secondary | ICD-10-CM

## 2011-07-22 LAB — CBC WITH DIFFERENTIAL/PLATELET
Basophils Absolute: 0 10*3/uL (ref 0.0–0.1)
EOS%: 0.6 % (ref 0.0–7.0)
Eosinophils Absolute: 0.1 10*3/uL (ref 0.0–0.5)
HCT: 38.8 % (ref 38.4–49.9)
HGB: 13.7 g/dL (ref 13.0–17.1)
MCH: 33.9 pg — ABNORMAL HIGH (ref 27.2–33.4)
NEUT#: 4 10*3/uL (ref 1.5–6.5)
NEUT%: 23.9 % — ABNORMAL LOW (ref 39.0–75.0)
RDW: 16 % — ABNORMAL HIGH (ref 11.0–14.6)
lymph#: 12 10*3/uL — ABNORMAL HIGH (ref 0.9–3.3)

## 2011-07-22 LAB — MORPHOLOGY
PLT EST: DECREASED
White Cell Comments: 1

## 2011-08-19 ENCOUNTER — Other Ambulatory Visit: Payer: Self-pay | Admitting: Oncology

## 2011-08-19 ENCOUNTER — Encounter (HOSPITAL_BASED_OUTPATIENT_CLINIC_OR_DEPARTMENT_OTHER): Payer: BC Managed Care – PPO | Admitting: Oncology

## 2011-08-19 DIAGNOSIS — Z5112 Encounter for antineoplastic immunotherapy: Secondary | ICD-10-CM

## 2011-08-19 DIAGNOSIS — C9110 Chronic lymphocytic leukemia of B-cell type not having achieved remission: Secondary | ICD-10-CM

## 2011-08-19 DIAGNOSIS — C8599 Non-Hodgkin lymphoma, unspecified, extranodal and solid organ sites: Secondary | ICD-10-CM

## 2011-08-19 LAB — CBC WITH DIFFERENTIAL/PLATELET
Eosinophils Absolute: 0.1 10*3/uL (ref 0.0–0.5)
LYMPH%: 84.2 % — ABNORMAL HIGH (ref 14.0–49.0)
MCH: 32.3 pg (ref 27.2–33.4)
MCV: 94.3 fL (ref 79.3–98.0)
MONO%: 2.8 % (ref 0.0–14.0)
NEUT#: 3.4 10*3/uL (ref 1.5–6.5)
Platelets: 96 10*3/uL — ABNORMAL LOW (ref 140–400)
RBC: 4.36 10*6/uL (ref 4.20–5.82)
nRBC: 0 % (ref 0–0)

## 2011-08-19 LAB — MORPHOLOGY: PLT EST: DECREASED

## 2011-08-20 LAB — COMPREHENSIVE METABOLIC PANEL
AST: 21 U/L (ref 0–37)
Albumin: 4.5 g/dL (ref 3.5–5.2)
Alkaline Phosphatase: 57 U/L (ref 39–117)
Potassium: 4.2 mEq/L (ref 3.5–5.3)
Sodium: 141 mEq/L (ref 135–145)
Total Bilirubin: 0.8 mg/dL (ref 0.3–1.2)
Total Protein: 6.1 g/dL (ref 6.0–8.3)

## 2011-08-20 LAB — LACTATE DEHYDROGENASE: LDH: 147 U/L (ref 94–250)

## 2011-08-20 LAB — IGG, IGA, IGM: IgA: 21 mg/dL — ABNORMAL LOW (ref 68–379)

## 2011-09-18 ENCOUNTER — Other Ambulatory Visit: Payer: Self-pay | Admitting: Oncology

## 2011-09-18 ENCOUNTER — Encounter (HOSPITAL_BASED_OUTPATIENT_CLINIC_OR_DEPARTMENT_OTHER): Payer: BC Managed Care – PPO | Admitting: Oncology

## 2011-09-18 DIAGNOSIS — Z5111 Encounter for antineoplastic chemotherapy: Secondary | ICD-10-CM

## 2011-09-18 DIAGNOSIS — C9110 Chronic lymphocytic leukemia of B-cell type not having achieved remission: Secondary | ICD-10-CM

## 2011-09-18 DIAGNOSIS — Z5112 Encounter for antineoplastic immunotherapy: Secondary | ICD-10-CM

## 2011-09-18 DIAGNOSIS — C8599 Non-Hodgkin lymphoma, unspecified, extranodal and solid organ sites: Secondary | ICD-10-CM

## 2011-09-18 LAB — CBC WITH DIFFERENTIAL/PLATELET
Basophils Absolute: 0 10*3/uL (ref 0.0–0.1)
EOS%: 0.8 % (ref 0.0–7.0)
HCT: 37.3 % — ABNORMAL LOW (ref 38.4–49.9)
HGB: 13.1 g/dL (ref 13.0–17.1)
MCH: 32.3 pg (ref 27.2–33.4)
MCHC: 35.1 g/dL (ref 32.0–36.0)
MCV: 92.1 fL (ref 79.3–98.0)
MONO%: 4.1 % (ref 0.0–14.0)
NEUT%: 16 % — ABNORMAL LOW (ref 39.0–75.0)

## 2011-09-18 LAB — MORPHOLOGY: PLT EST: DECREASED

## 2011-10-14 ENCOUNTER — Encounter (HOSPITAL_BASED_OUTPATIENT_CLINIC_OR_DEPARTMENT_OTHER): Payer: BC Managed Care – PPO | Admitting: Oncology

## 2011-10-14 ENCOUNTER — Other Ambulatory Visit: Payer: Self-pay | Admitting: Oncology

## 2011-10-14 DIAGNOSIS — C9110 Chronic lymphocytic leukemia of B-cell type not having achieved remission: Secondary | ICD-10-CM

## 2011-10-14 DIAGNOSIS — Z23 Encounter for immunization: Secondary | ICD-10-CM

## 2011-10-14 DIAGNOSIS — Z5112 Encounter for antineoplastic immunotherapy: Secondary | ICD-10-CM

## 2011-10-14 DIAGNOSIS — C8599 Non-Hodgkin lymphoma, unspecified, extranodal and solid organ sites: Secondary | ICD-10-CM

## 2011-10-14 LAB — CBC WITH DIFFERENTIAL/PLATELET
BASO%: 0.1 % (ref 0.0–2.0)
Basophils Absolute: 0 10e3/uL (ref 0.0–0.1)
EOS%: 0.4 % (ref 0.0–7.0)
Eosinophils Absolute: 0.1 10e3/uL (ref 0.0–0.5)
HCT: 39.6 % (ref 38.4–49.9)
HGB: 13.7 g/dL (ref 13.0–17.1)
LYMPH%: 85.6 % — ABNORMAL HIGH (ref 14.0–49.0)
MCH: 32.7 pg (ref 27.2–33.4)
MCHC: 34.6 g/dL (ref 32.0–36.0)
MCV: 94.5 fL (ref 79.3–98.0)
MONO#: 0.6 10e3/uL (ref 0.1–0.9)
MONO%: 2 % (ref 0.0–14.0)
NEUT#: 3.5 10e3/uL (ref 1.5–6.5)
NEUT%: 11.9 % — ABNORMAL LOW (ref 39.0–75.0)
Platelets: 101 10e3/uL — ABNORMAL LOW (ref 140–400)
RBC: 4.19 10e6/uL — ABNORMAL LOW (ref 4.20–5.82)
RDW: 15.2 % — ABNORMAL HIGH (ref 11.0–14.6)
WBC: 29.3 10e3/uL — ABNORMAL HIGH (ref 4.0–10.3)
lymph#: 25.1 10e3/uL — ABNORMAL HIGH (ref 0.9–3.3)

## 2011-10-14 LAB — MORPHOLOGY: PLT EST: DECREASED

## 2011-10-14 LAB — CHCC SMEAR

## 2011-11-04 ENCOUNTER — Other Ambulatory Visit: Payer: Self-pay | Admitting: *Deleted

## 2011-11-04 ENCOUNTER — Telehealth: Payer: Self-pay | Admitting: *Deleted

## 2011-11-04 DIAGNOSIS — Z949 Transplanted organ and tissue status, unspecified: Secondary | ICD-10-CM

## 2011-11-05 ENCOUNTER — Other Ambulatory Visit: Payer: Self-pay | Admitting: *Deleted

## 2011-11-05 NOTE — Telephone Encounter (Signed)
Appt scheduled per [facility] request.

## 2011-11-06 ENCOUNTER — Ambulatory Visit (HOSPITAL_BASED_OUTPATIENT_CLINIC_OR_DEPARTMENT_OTHER): Payer: BC Managed Care – PPO

## 2011-11-06 ENCOUNTER — Other Ambulatory Visit: Payer: Self-pay | Admitting: Oncology

## 2011-11-06 DIAGNOSIS — C9110 Chronic lymphocytic leukemia of B-cell type not having achieved remission: Secondary | ICD-10-CM

## 2011-11-06 DIAGNOSIS — C8599 Non-Hodgkin lymphoma, unspecified, extranodal and solid organ sites: Secondary | ICD-10-CM

## 2011-11-06 DIAGNOSIS — C9421 Acute megakaryoblastic leukemia, in remission: Secondary | ICD-10-CM

## 2011-11-06 LAB — CBC WITH DIFFERENTIAL/PLATELET
Basophils Absolute: 0 10*3/uL (ref 0.0–0.1)
Eosinophils Absolute: 0.1 10*3/uL (ref 0.0–0.5)
HCT: 38.2 % — ABNORMAL LOW (ref 38.4–49.9)
HGB: 13.3 g/dL (ref 13.0–17.1)
MCH: 33.8 pg — ABNORMAL HIGH (ref 27.2–33.4)
MCV: 96.9 fL (ref 79.3–98.0)
MONO%: 3.9 % (ref 0.0–14.0)
NEUT#: 2.6 10*3/uL (ref 1.5–6.5)
NEUT%: 17.1 % — ABNORMAL LOW (ref 39.0–75.0)
RDW: 16.5 % — ABNORMAL HIGH (ref 11.0–14.6)
lymph#: 11.9 10*3/uL — ABNORMAL HIGH (ref 0.9–3.3)

## 2011-11-06 LAB — MORPHOLOGY

## 2011-11-11 ENCOUNTER — Other Ambulatory Visit: Payer: BC Managed Care – PPO | Admitting: Lab

## 2011-12-09 ENCOUNTER — Other Ambulatory Visit: Payer: Self-pay | Admitting: Oncology

## 2011-12-09 ENCOUNTER — Telehealth: Payer: Self-pay | Admitting: Oncology

## 2011-12-09 ENCOUNTER — Other Ambulatory Visit (HOSPITAL_BASED_OUTPATIENT_CLINIC_OR_DEPARTMENT_OTHER): Payer: BC Managed Care – PPO | Admitting: Lab

## 2011-12-09 ENCOUNTER — Ambulatory Visit (HOSPITAL_BASED_OUTPATIENT_CLINIC_OR_DEPARTMENT_OTHER): Payer: BC Managed Care – PPO | Admitting: Oncology

## 2011-12-09 VITALS — BP 128/73 | HR 61 | Temp 98.6°F | Ht 67.5 in | Wt 280.5 lb

## 2011-12-09 DIAGNOSIS — C9110 Chronic lymphocytic leukemia of B-cell type not having achieved remission: Secondary | ICD-10-CM

## 2011-12-09 DIAGNOSIS — Z09 Encounter for follow-up examination after completed treatment for conditions other than malignant neoplasm: Secondary | ICD-10-CM

## 2011-12-09 DIAGNOSIS — C8599 Non-Hodgkin lymphoma, unspecified, extranodal and solid organ sites: Secondary | ICD-10-CM

## 2011-12-09 DIAGNOSIS — Z5112 Encounter for antineoplastic immunotherapy: Secondary | ICD-10-CM

## 2011-12-09 LAB — CBC WITH DIFFERENTIAL/PLATELET
BASO%: 0.6 % (ref 0.0–2.0)
EOS%: 0.3 % (ref 0.0–7.0)
MCH: 33.7 pg — ABNORMAL HIGH (ref 27.2–33.4)
MCHC: 34.3 g/dL (ref 32.0–36.0)
MONO#: 1 10*3/uL — ABNORMAL HIGH (ref 0.1–0.9)
NEUT%: 11 % — ABNORMAL LOW (ref 39.0–75.0)
RBC: 4.02 10*6/uL — ABNORMAL LOW (ref 4.20–5.82)
RDW: 16.9 % — ABNORMAL HIGH (ref 11.0–14.6)
WBC: 35.5 10*3/uL — ABNORMAL HIGH (ref 4.0–10.3)
lymph#: 30.3 10*3/uL — ABNORMAL HIGH (ref 0.9–3.3)

## 2011-12-09 LAB — CHCC SMEAR

## 2011-12-09 LAB — MORPHOLOGY

## 2011-12-09 NOTE — Telephone Encounter (Signed)
[facility] pt appt for jan-feb2013

## 2011-12-09 NOTE — Telephone Encounter (Signed)
called pts home s/w wife and asked that he pick up scheduled for jan-feb2013 on tomorrow

## 2011-12-09 NOTE — Progress Notes (Signed)
ID: Timothy [name]   Interval History:   Teary returns today with his wife [name] for followup of his chronic lymphoid leukemia. He has been evaluated at the [facility] (the [facility]) and they have identified 3 matched unrelated donors, who are being further tested. His brother actually was not a Microbiologist. Romario is "ready to go" as soon as possible give him to go ahead. He is hoping he can get started in January or February for insurance reasons.  ROS:  He is having no "B." symptoms, specifically no weight loss, unexplained fatigue, drenching sweats, fevers, rash, bleeding, or other symptoms suggestive of worsening or transforming CLL. He has noted a bump in his left neck which he wanted me to feel. He bruises easily. Otherwise a detailed review of systems was noncontributory.   Medications: I have reviewed the patient's current medications.  Current Outpatient Prescriptions  Medication Sig Dispense Refill  . amLODipine (NORVASC) 5 MG tablet Take 5 mg by mouth daily.        Marland Kitchen atorvastatin (LIPITOR) 10 MG tablet Take 10 mg by mouth daily.        . bisoprolol-hydrochlorothiazide (ZIAC) 10-6.25 MG per tablet       . sertraline (ZOLOFT) 50 MG tablet       . trandolapril (MAVIK) 4 MG tablet Take 4 mg by mouth daily.           Objective:  Filed Vitals:   65/18/12 1016  BP: 128/73  Pulse: 61  Temp: 98.6 F (37 C)   Body mass index is 43.28 kg/(m^2).  Physical Exam:   Sclerae unicteric  Oropharynx clear  There is a 1-1/2 cm soft movable lymph node in the left anterior of next triangle. I do not palpate any other axillary inguinal cervical or supraclavicular adenopathy.  Lungs clear -- no rales or rhonchi  Heart regular rate and rhythm  Abdomen benign  MSK no focal spinal tenderness, no peripheral edema  Neuro nonfocal    Lab Results:  CMP    Chemistry      Component Value Date/Time   NA 141 08/19/2011 0856   NA 141 08/19/2011 0856   K 4.2 08/19/2011 0856   K 4.2 08/19/2011 0856   CL 106 08/19/2011 0856   CL 106 08/19/2011 0856   CO2 21 08/19/2011 0856   CO2 21 08/19/2011 0856   BUN 18 08/19/2011 0856   BUN 18 08/19/2011 0856   CREATININE 0.91 08/19/2011 0856   CREATININE 0.91 08/19/2011 0856      Component Value Date/Time   CALCIUM 8.9 08/19/2011 0856   CALCIUM 8.9 08/19/2011 0856   ALKPHOS 57 08/19/2011 0856   ALKPHOS 57 08/19/2011 0856   AST 21 08/19/2011 0856   AST 21 08/19/2011 0856   ALT 16 08/19/2011 0856   ALT 16 08/19/2011 0856   BILITOT 0.8 08/19/2011 0856   BILITOT 0.8 08/19/2011 0856       CBC Lab Results  Component Value Date   WBC 35.5* 12/09/2011   HGB 13.6 12/09/2011   HCT 39.5 12/09/2011   MCV 98.2* 12/09/2011   PLT 105* 12/09/2011   NEUTROABS 3.9 12/09/2011    Studies/Results:  No new results found.  Assessment: 65 year old [place] man with a history of chronic lymphoid leukemia/well-differentiated lymphocytic lymphoma initially diagnosed in 2000, not requiring treatment until 2006, when he received fludarabine/cyclophosphamide/rituximab x5, completed May of 2007. She has received a rituximab x8 starting in October of 2010, and then [employer]  and ofatumumab July of 2011, completed September of 2011, since which time he has been on maintenance ofatumumab every 2 months.   Plan: I have strongly encouraged him to exercise on a regular basis. Currently he is walking 3 times a week. He she really do that at least 5 times a week and at least one hour time. He is also working on diet. He tells me he is ready to go to [facility] whenever "they give me the call". For insurance reasons he would prefer this would be January or February. I will contact the [place] group to let him know that he is ready to go as soon as they are.  Otherwise she will return here in one month for labs and in 2 months for his next cycle of ofatumumab. If he does go to [place] he will let us know. I gave him my email today to  facilitate communication.  Raynaldo Falco C 12/09/2011

## 2011-12-10 ENCOUNTER — Ambulatory Visit (HOSPITAL_BASED_OUTPATIENT_CLINIC_OR_DEPARTMENT_OTHER): Payer: BC Managed Care – PPO

## 2011-12-10 VITALS — BP 139/62 | HR 67 | Temp 97.9°F

## 2011-12-10 DIAGNOSIS — C8599 Non-Hodgkin lymphoma, unspecified, extranodal and solid organ sites: Secondary | ICD-10-CM

## 2011-12-10 DIAGNOSIS — C9110 Chronic lymphocytic leukemia of B-cell type not having achieved remission: Secondary | ICD-10-CM

## 2011-12-10 DIAGNOSIS — Z5112 Encounter for antineoplastic immunotherapy: Secondary | ICD-10-CM

## 2011-12-10 MED ORDER — DIPHENHYDRAMINE HCL 50 MG/ML IJ SOLN
50.0000 mg | Freq: Once | INTRAMUSCULAR | Status: AC | PRN
  Administered 2011-12-10: 50 mg via INTRAVENOUS

## 2011-12-10 MED ORDER — FAMOTIDINE IN NACL 20-0.9 MG/50ML-% IV SOLN
20.0000 mg | Freq: Once | INTRAVENOUS | Status: AC
  Administered 2011-12-10: 20 mg via INTRAVENOUS

## 2011-12-10 MED ORDER — SODIUM CHLORIDE 0.9 % IV SOLN
2000.0000 mg | Freq: Once | INTRAVENOUS | Status: AC
  Administered 2011-12-10: 2000 mg via INTRAVENOUS
  Filled 2011-12-10: qty 100

## 2011-12-10 MED ORDER — ACETAMINOPHEN 500 MG PO TABS
1000.0000 mg | ORAL_TABLET | Freq: Once | ORAL | Status: AC
  Administered 2011-12-10: 1000 mg via ORAL

## 2011-12-10 MED ORDER — LORAZEPAM 2 MG/ML IJ SOLN
0.5000 mg | Freq: Once | INTRAMUSCULAR | Status: AC
  Administered 2011-12-10: 0.5 mg via INTRAVENOUS

## 2011-12-10 MED ORDER — METHYLPREDNISOLONE SODIUM SUCC 125 MG IJ SOLR
125.0000 mg | Freq: Once | INTRAMUSCULAR | Status: AC
  Administered 2011-12-10: 125 mg via INTRAVENOUS

## 2011-12-10 MED ORDER — SODIUM CHLORIDE 0.9 % IV SOLN
Freq: Once | INTRAVENOUS | Status: AC
  Administered 2011-12-10: 09:00:00 via INTRAVENOUS

## 2011-12-10 NOTE — Progress Notes (Signed)
At 0945, Arzerra infusion increased to flow at 25 mls per hour for 12.5 mls.

## 2011-12-10 NOTE — Progress Notes (Signed)
At 1330, VSS,denies distress, Infusion  Increased to flow at 400 mls per hour for 200 mls.

## 2011-12-10 NOTE — Progress Notes (Signed)
At 1115, pt c/o slight itching at mouth area, pink rash noted around mouth, slight itching noted on arms.  Infusion staooed at once, NS line hung at 200 mls per hour. Dr. Darnelle Catalan  Informed. Orders given, Diphenhydramine 25 mg given IV per orders.

## 2011-12-10 NOTE — Progress Notes (Signed)
At 1200, VSS,denies distress.  No rash or itching noted.  Arzerra  Started at 100 mls per hour for 50 mls. Pt instructed to inform nurse at once if having, itching, difficulty breathing, chest pain, or sudden chills.

## 2011-12-10 NOTE — Progress Notes (Signed)
At 1045, VSS, denies distress,  arzerra increased ro flow at 100 mls per hour for 50 mls.

## 2011-12-10 NOTE — Progress Notes (Signed)
Denies distress, infusing without complications. Skin is warm, dry, no itching noted.

## 2011-12-10 NOTE — Progress Notes (Signed)
VSS, denies distress, infusion increased to flow at 50 ml per hour for 25 mls.

## 2011-12-10 NOTE — Progress Notes (Signed)
States decrease in  Itching, mouth area is lighter is colouring.  VSS, no difficulty breathing stated or noted.

## 2011-12-10 NOTE — Progress Notes (Signed)
Arzerra started at 12 ml per hour for 6 mls.Pt instructed to  Inform nurse at once if having difficulty breathing,  Rashes,  Chest pain, or sudden chills.

## 2011-12-10 NOTE — Patient Instructions (Signed)
12/10/11 1548-Pt discharged ambulatory with next appointment confirmed.  Pt aware to call with any questions or concerns.

## 2011-12-10 NOTE — Progress Notes (Signed)
At 1230, VSS,denies distress infusion increased to flow at 200 ml per hour for 100 mls. Denies itching or rashes

## 2011-12-10 NOTE — Progress Notes (Signed)
At 1130, no itching stated, mouth area is flesh toned, no difficulty breathing or chest pain noted.

## 2011-12-10 NOTE — Progress Notes (Signed)
At 1400, VSS,denies distress, infsuing at 350 mls per hour for  One hour,  Denies itching, no redness or rash noted.

## 2011-12-10 NOTE — Progress Notes (Signed)
At 1300, VSS, denies distress. Infusion increased to flow at 300 mls per hour for 150 mls. Denies itching, or distress.

## 2012-01-05 ENCOUNTER — Other Ambulatory Visit: Payer: Self-pay | Admitting: Oncology

## 2012-01-05 NOTE — Clinical Documentation Improvement (Signed)
The pt. Sees Fortino Sic at [facility]; Rudell Cobb is the coordinator [phone]

## 2012-01-08 ENCOUNTER — Other Ambulatory Visit (HOSPITAL_BASED_OUTPATIENT_CLINIC_OR_DEPARTMENT_OTHER): Payer: BC Managed Care – PPO | Admitting: Lab

## 2012-01-08 DIAGNOSIS — Z5112 Encounter for antineoplastic immunotherapy: Secondary | ICD-10-CM

## 2012-01-08 DIAGNOSIS — C9110 Chronic lymphocytic leukemia of B-cell type not having achieved remission: Secondary | ICD-10-CM

## 2012-01-08 DIAGNOSIS — C8599 Non-Hodgkin lymphoma, unspecified, extranodal and solid organ sites: Secondary | ICD-10-CM

## 2012-01-08 LAB — MORPHOLOGY: PLT EST: DECREASED

## 2012-01-08 LAB — CHCC SMEAR

## 2012-01-08 LAB — CBC WITH DIFFERENTIAL/PLATELET
Basophils Absolute: 0.1 10*3/uL (ref 0.0–0.1)
EOS%: 0.4 % (ref 0.0–7.0)
MCH: 33.7 pg — ABNORMAL HIGH (ref 27.2–33.4)
MCHC: 34.2 g/dL (ref 32.0–36.0)
MCV: 98.6 fL — ABNORMAL HIGH (ref 79.3–98.0)
MONO%: 3.9 % (ref 0.0–14.0)
RBC: 3.95 10*6/uL — ABNORMAL LOW (ref 4.20–5.82)
RDW: 17.4 % — ABNORMAL HIGH (ref 11.0–14.6)

## 2012-02-09 ENCOUNTER — Ambulatory Visit: Payer: BC Managed Care – PPO | Admitting: Physician Assistant

## 2012-02-09 ENCOUNTER — Other Ambulatory Visit: Payer: BC Managed Care – PPO | Admitting: Lab

## 2012-02-10 ENCOUNTER — Ambulatory Visit: Payer: BC Managed Care – PPO

## 2012-02-12 ENCOUNTER — Other Ambulatory Visit: Payer: BC Managed Care – PPO | Admitting: Lab

## 2012-02-12 ENCOUNTER — Ambulatory Visit: Payer: BC Managed Care – PPO | Admitting: Physician Assistant

## 2012-06-04 ENCOUNTER — Telehealth: Payer: Self-pay | Admitting: Medical Oncology

## 2012-06-04 NOTE — Telephone Encounter (Addendum)
Received call from patient " I am going to need to cancel my upcoming appointments with Dr. Darnelle Catalan, I am still in [place] and have had some set backs after my bone marrow transplant."  Spoke with patients wife and she stated " Timothy [name] has had some setback after his transplant with fatigue and nausea and the doctors here want to monitor him a little longer.  We just wanted to cancel our upcoming appointment." Instructed wife to call office to reschedule appointment when they knew a more definitive discharge date.  Spouse expressed understanding.  Appointments canceled in computer.

## 2012-06-10 ENCOUNTER — Ambulatory Visit: Payer: BC Managed Care – PPO | Admitting: Oncology

## 2012-06-10 ENCOUNTER — Other Ambulatory Visit: Payer: BC Managed Care – PPO | Admitting: Lab

## 2012-07-01 ENCOUNTER — Telehealth: Payer: Self-pay

## 2012-07-01 NOTE — Telephone Encounter (Signed)
Received message from Wynona Canes, NP at [facility].  She would like to speak with Dr. Darnelle Catalan, as pt is planned to be discharged next week, and she wants to speak with him to coordinate pt's care and other specifics.  Her number is [phone], and pager is [phone].  Note given to MD for review.

## 2012-07-03 ENCOUNTER — Other Ambulatory Visit: Payer: Self-pay | Admitting: Oncology

## 2012-07-03 NOTE — Progress Notes (Signed)
I discussed series situation with Wynona Canes, a nurse practitioner for the transplant team at "[facility]", we are care he is constantly on day #131. She tells me that he did well for the first 8 days, then had CM of the reactivation and some graft-versus-host disease. Repeat endoscopy more recently showed mild graft versus host and then he had a second reactivation of CMV. He is currently on a half a milligram per kilogram steroid taper and continues on ganciclovir, which however has caused and his white cell count to go down. They're hesitant to use foster net because of reduced kidney function.  They are hoping to release series of the week of July 15, and if so we will need to do her close followup here. They will call me on the 15th to update me.

## 2012-07-05 ENCOUNTER — Other Ambulatory Visit: Payer: Self-pay | Admitting: *Deleted

## 2012-07-05 ENCOUNTER — Other Ambulatory Visit: Payer: Self-pay | Admitting: Oncology

## 2012-07-05 ENCOUNTER — Telehealth: Payer: Self-pay | Admitting: *Deleted

## 2012-07-05 DIAGNOSIS — C9110 Chronic lymphocytic leukemia of B-cell type not having achieved remission: Secondary | ICD-10-CM

## 2012-07-05 NOTE — Progress Notes (Signed)
Call received from [facility] with [facility].  Pt will be leaving in am for return to [facility] by 11am-  Pt will need to resume GCSF upon arrival. Dennie Bible states pt is taking the dose daily until counts recover.  CMV by PCR is quantitative and will be drawn today- and Dennie Bible will call results regarding if pt needs to continue Gamcyclovir.  Contact information for Dennie Bible is phone number- [phone].

## 2012-07-06 ENCOUNTER — Other Ambulatory Visit: Payer: Self-pay | Admitting: Oncology

## 2012-07-06 ENCOUNTER — Telehealth: Payer: Self-pay | Admitting: Oncology

## 2012-07-06 ENCOUNTER — Telehealth: Payer: Self-pay | Admitting: *Deleted

## 2012-07-06 ENCOUNTER — Other Ambulatory Visit (HOSPITAL_BASED_OUTPATIENT_CLINIC_OR_DEPARTMENT_OTHER): Payer: BC Managed Care – PPO | Admitting: Lab

## 2012-07-06 ENCOUNTER — Ambulatory Visit (HOSPITAL_BASED_OUTPATIENT_CLINIC_OR_DEPARTMENT_OTHER): Payer: BC Managed Care – PPO

## 2012-07-06 VITALS — BP 148/79 | HR 86 | Temp 97.6°F

## 2012-07-06 DIAGNOSIS — C9110 Chronic lymphocytic leukemia of B-cell type not having achieved remission: Secondary | ICD-10-CM

## 2012-07-06 LAB — CBC WITH DIFFERENTIAL/PLATELET
Basophils Absolute: 0 10*3/uL (ref 0.0–0.1)
Eosinophils Absolute: 0 10*3/uL (ref 0.0–0.5)
HGB: 9.8 g/dL — ABNORMAL LOW (ref 13.0–17.1)
MCV: 91.8 fL (ref 79.3–98.0)
MONO%: 23 % — ABNORMAL HIGH (ref 0.0–14.0)
NEUT#: 2.8 10*3/uL (ref 1.5–6.5)
RDW: 15.7 % — ABNORMAL HIGH (ref 11.0–14.6)
lymph#: 0.4 10*3/uL — ABNORMAL LOW (ref 0.9–3.3)

## 2012-07-06 LAB — TECHNOLOGIST REVIEW

## 2012-07-06 MED ORDER — FILGRASTIM 480 MCG/0.8ML IJ SOLN
480.0000 ug | Freq: Once | INTRAMUSCULAR | Status: AC
  Administered 2012-07-06: 480 ug via SUBCUTANEOUS
  Filled 2012-07-06: qty 0.8

## 2012-07-06 NOTE — Telephone Encounter (Signed)
S/w the pt and he is aware to pick up his July 2013 appt calendar and the appt to see dr [place] in aug which was his 1st available appt

## 2012-07-06 NOTE — Telephone Encounter (Signed)
Pt returned today from [place] post autologous BMTX.  Note contact for coordination of care per [facility]  [facility]) is :  Merchant navy officer Phone [phone] Fax - [phone]

## 2012-07-06 NOTE — Telephone Encounter (Signed)
per nurse notes she has all ready gave patient

## 2012-07-07 ENCOUNTER — Ambulatory Visit: Payer: BC Managed Care – PPO

## 2012-07-07 ENCOUNTER — Ambulatory Visit (HOSPITAL_BASED_OUTPATIENT_CLINIC_OR_DEPARTMENT_OTHER): Payer: BC Managed Care – PPO

## 2012-07-07 ENCOUNTER — Other Ambulatory Visit (HOSPITAL_BASED_OUTPATIENT_CLINIC_OR_DEPARTMENT_OTHER): Payer: BC Managed Care – PPO | Admitting: Lab

## 2012-07-07 VITALS — BP 138/78 | HR 88 | Temp 97.6°F

## 2012-07-07 DIAGNOSIS — C9110 Chronic lymphocytic leukemia of B-cell type not having achieved remission: Secondary | ICD-10-CM

## 2012-07-07 LAB — MORPHOLOGY

## 2012-07-07 LAB — CBC WITH DIFFERENTIAL/PLATELET
Basophils Absolute: 0 10*3/uL (ref 0.0–0.1)
Eosinophils Absolute: 0 10*3/uL (ref 0.0–0.5)
HCT: 28.4 % — ABNORMAL LOW (ref 38.4–49.9)
HGB: 9.5 g/dL — ABNORMAL LOW (ref 13.0–17.1)
MONO#: 0.4 10*3/uL (ref 0.1–0.9)
NEUT#: 5 10*3/uL (ref 1.5–6.5)
NEUT%: 86.7 % — ABNORMAL HIGH (ref 39.0–75.0)
WBC: 5.7 10*3/uL (ref 4.0–10.3)
lymph#: 0.3 10*3/uL — ABNORMAL LOW (ref 0.9–3.3)

## 2012-07-07 MED ORDER — FILGRASTIM 480 MCG/0.8ML IJ SOLN
480.0000 ug | Freq: Once | INTRAMUSCULAR | Status: AC
  Administered 2012-07-07: 480 ug via SUBCUTANEOUS

## 2012-07-07 NOTE — Telephone Encounter (Signed)
No entry 

## 2012-07-08 ENCOUNTER — Ambulatory Visit (HOSPITAL_BASED_OUTPATIENT_CLINIC_OR_DEPARTMENT_OTHER): Payer: BC Managed Care – PPO | Admitting: Oncology

## 2012-07-08 ENCOUNTER — Ambulatory Visit: Payer: BC Managed Care – PPO

## 2012-07-08 ENCOUNTER — Ambulatory Visit: Payer: BC Managed Care – PPO | Admitting: Oncology

## 2012-07-08 ENCOUNTER — Other Ambulatory Visit: Payer: Self-pay | Admitting: *Deleted

## 2012-07-08 ENCOUNTER — Other Ambulatory Visit (HOSPITAL_BASED_OUTPATIENT_CLINIC_OR_DEPARTMENT_OTHER): Payer: BC Managed Care – PPO

## 2012-07-08 VITALS — BP 156/84 | HR 80 | Temp 97.8°F | Ht 67.5 in | Wt 213.4 lb

## 2012-07-08 DIAGNOSIS — N182 Chronic kidney disease, stage 2 (mild): Secondary | ICD-10-CM

## 2012-07-08 DIAGNOSIS — I4510 Unspecified right bundle-branch block: Secondary | ICD-10-CM

## 2012-07-08 DIAGNOSIS — G729 Myopathy, unspecified: Secondary | ICD-10-CM

## 2012-07-08 DIAGNOSIS — E785 Hyperlipidemia, unspecified: Secondary | ICD-10-CM

## 2012-07-08 DIAGNOSIS — I10 Essential (primary) hypertension: Secondary | ICD-10-CM

## 2012-07-08 DIAGNOSIS — D89811 Chronic graft-versus-host disease: Secondary | ICD-10-CM

## 2012-07-08 DIAGNOSIS — C9110 Chronic lymphocytic leukemia of B-cell type not having achieved remission: Secondary | ICD-10-CM

## 2012-07-08 DIAGNOSIS — E099 Drug or chemical induced diabetes mellitus without complications: Secondary | ICD-10-CM

## 2012-07-08 DIAGNOSIS — R894 Abnormal immunological findings in specimens from other organs, systems and tissues: Secondary | ICD-10-CM

## 2012-07-08 DIAGNOSIS — E669 Obesity, unspecified: Secondary | ICD-10-CM

## 2012-07-08 DIAGNOSIS — G722 Myopathy due to other toxic agents: Secondary | ICD-10-CM

## 2012-07-08 DIAGNOSIS — K5790 Diverticulosis of intestine, part unspecified, without perforation or abscess without bleeding: Secondary | ICD-10-CM

## 2012-07-08 DIAGNOSIS — K449 Diaphragmatic hernia without obstruction or gangrene: Secondary | ICD-10-CM

## 2012-07-08 DIAGNOSIS — I4891 Unspecified atrial fibrillation: Secondary | ICD-10-CM

## 2012-07-08 DIAGNOSIS — D61818 Other pancytopenia: Secondary | ICD-10-CM

## 2012-07-08 DIAGNOSIS — G252 Other specified forms of tremor: Secondary | ICD-10-CM

## 2012-07-08 DIAGNOSIS — Z9489 Other transplanted organ and tissue status: Secondary | ICD-10-CM

## 2012-07-08 DIAGNOSIS — R768 Other specified abnormal immunological findings in serum: Secondary | ICD-10-CM

## 2012-07-08 LAB — MORPHOLOGY

## 2012-07-08 LAB — CBC WITH DIFFERENTIAL/PLATELET
BASO%: 0.7 % (ref 0.0–2.0)
EOS%: 0.3 % (ref 0.0–7.0)
HCT: 30.9 % — ABNORMAL LOW (ref 38.4–49.9)
LYMPH%: 4.1 % — ABNORMAL LOW (ref 14.0–49.0)
MCH: 31.8 pg (ref 27.2–33.4)
MCHC: 33.1 g/dL (ref 32.0–36.0)
MCV: 96 fL (ref 79.3–98.0)
MONO%: 8 % (ref 0.0–14.0)
NEUT%: 86.9 % — ABNORMAL HIGH (ref 39.0–75.0)
Platelets: 76 10*3/uL — ABNORMAL LOW (ref 140–400)

## 2012-07-08 MED ORDER — BECLOMETHASONE DIPROPIONATE POWD: Status: DC

## 2012-07-08 MED ORDER — IMMUNE GLOBULIN (HUMAN) 10 GM/200ML IV SOLN: 400.0000 mg/kg | Freq: Once | INTRAVENOUS | Status: DC

## 2012-07-09 ENCOUNTER — Ambulatory Visit: Payer: BC Managed Care – PPO

## 2012-07-09 ENCOUNTER — Other Ambulatory Visit: Payer: BC Managed Care – PPO | Admitting: Lab

## 2012-07-09 ENCOUNTER — Other Ambulatory Visit: Payer: Self-pay | Admitting: *Deleted

## 2012-07-09 ENCOUNTER — Telehealth: Payer: Self-pay | Admitting: *Deleted

## 2012-07-09 ENCOUNTER — Ambulatory Visit (HOSPITAL_BASED_OUTPATIENT_CLINIC_OR_DEPARTMENT_OTHER): Payer: BC Managed Care – PPO

## 2012-07-09 VITALS — BP 143/80 | HR 78 | Temp 98.4°F

## 2012-07-09 DIAGNOSIS — C9110 Chronic lymphocytic leukemia of B-cell type not having achieved remission: Secondary | ICD-10-CM

## 2012-07-09 MED ORDER — HEPARIN SOD (PORK) LOCK FLUSH 100 UNIT/ML IV SOLN
250.0000 [IU] | Freq: Once | INTRAVENOUS | Status: DC | PRN
  Filled 2012-07-09: qty 5

## 2012-07-09 MED ORDER — DIPHENHYDRAMINE HCL 25 MG PO TABS
25.0000 mg | ORAL_TABLET | Freq: Once | ORAL | Status: AC
  Administered 2012-07-09: 25 mg via ORAL
  Filled 2012-07-09: qty 1

## 2012-07-09 MED ORDER — SODIUM CHLORIDE 0.9 % IV SOLN
Freq: Once | INTRAVENOUS | Status: AC
  Administered 2012-07-09: 11:00:00 via INTRAVENOUS

## 2012-07-09 MED ORDER — ACETAMINOPHEN 325 MG PO TABS
650.0000 mg | ORAL_TABLET | Freq: Four times a day (QID) | ORAL | Status: DC | PRN
  Administered 2012-07-09: 650 mg via ORAL

## 2012-07-09 MED ORDER — SODIUM CHLORIDE 0.9 % IJ SOLN
3.0000 mL | Freq: Once | INTRAMUSCULAR | Status: DC | PRN
  Filled 2012-07-09: qty 10

## 2012-07-09 MED ORDER — IMMUNE GLOBULIN (HUMAN) 20 GM/200ML IV SOLN
40.0000 g | Freq: Once | INTRAVENOUS | Status: AC
  Administered 2012-07-09: 40 g via INTRAVENOUS
  Filled 2012-07-09: qty 400

## 2012-07-09 MED ORDER — LORAZEPAM 2 MG/ML IJ SOLN
0.5000 mg | Freq: Once | INTRAMUSCULAR | Status: AC
  Administered 2012-07-09: 0.5 mg via INTRAVENOUS

## 2012-07-09 NOTE — Telephone Encounter (Signed)
Per pt request[facility] [phone]. To inquire about beclomethasone mixed with corn oil

## 2012-07-09 NOTE — Patient Instructions (Addendum)
[facility] Discharge Instructions for Patients Receiving Chemotherapy  Today you received the following chemotherapy agents IVIG  To help prevent nausea and vomiting after your treatment, we encourage you to take your nausea medication and take it as often as prescribed    If you develop nausea and vomiting that is not controlled by your nausea medication, call the clinic. If it is after clinic hours your family physician or the after hours number for the clinic or go to the Emergency Department.   BELOW ARE SYMPTOMS THAT SHOULD BE REPORTED IMMEDIATELY:  *FEVER GREATER THAN 100.5 F  *CHILLS WITH OR WITHOUT FEVER  NAUSEA AND VOMITING THAT IS NOT CONTROLLED WITH YOUR NAUSEA MEDICATION  *UNUSUAL SHORTNESS OF BREATH  *UNUSUAL BRUISING OR BLEEDING  TENDERNESS IN MOUTH AND THROAT WITH OR WITHOUT PRESENCE OF ULCERS  *URINARY PROBLEMS  *BOWEL PROBLEMS  UNUSUAL RASH Items with * indicate a potential emergency and should be followed up as soon as possible.  One of the nurses will contact you 24 hours after your treatment. Please let the nurse know about any problems that you may have experienced. Feel free to call the clinic you have any questions or concerns. The clinic phone number is [phone].   I have been informed and understand all the instructions given to me. I know to contact the clinic, my physician, or go to the Emergency Department if any problems should occur. I do not have any questions at this time, but understand that I may call the clinic during office hours   should I have any questions or need assistance in obtaining follow up care.    __________________________________________  _____________  __________ Signature of Patient or Authorized Representative            Date                   Time    __________________________________________ Nurse's Signature    

## 2012-07-09 NOTE — Progress Notes (Signed)
maxed pt out at 179ml/hr, tolerating well.  dmr

## 2012-07-10 ENCOUNTER — Ambulatory Visit: Payer: BC Managed Care – PPO

## 2012-07-12 ENCOUNTER — Telehealth: Payer: Self-pay | Admitting: Oncology

## 2012-07-12 ENCOUNTER — Ambulatory Visit: Payer: BC Managed Care – PPO

## 2012-07-12 ENCOUNTER — Encounter: Payer: Self-pay | Admitting: Oncology

## 2012-07-12 ENCOUNTER — Other Ambulatory Visit: Payer: BC Managed Care – PPO | Admitting: Lab

## 2012-07-12 DIAGNOSIS — K5790 Diverticulosis of intestine, part unspecified, without perforation or abscess without bleeding: Secondary | ICD-10-CM | POA: Insufficient documentation

## 2012-07-12 DIAGNOSIS — K449 Diaphragmatic hernia without obstruction or gangrene: Secondary | ICD-10-CM | POA: Insufficient documentation

## 2012-07-12 DIAGNOSIS — E669 Obesity, unspecified: Secondary | ICD-10-CM | POA: Insufficient documentation

## 2012-07-12 DIAGNOSIS — D89811 Chronic graft-versus-host disease: Secondary | ICD-10-CM | POA: Insufficient documentation

## 2012-07-12 DIAGNOSIS — N182 Chronic kidney disease, stage 2 (mild): Secondary | ICD-10-CM | POA: Insufficient documentation

## 2012-07-12 DIAGNOSIS — I4510 Unspecified right bundle-branch block: Secondary | ICD-10-CM | POA: Insufficient documentation

## 2012-07-12 DIAGNOSIS — R894 Abnormal immunological findings in specimens from other organs, systems and tissues: Secondary | ICD-10-CM | POA: Insufficient documentation

## 2012-07-12 DIAGNOSIS — R768 Other specified abnormal immunological findings in serum: Secondary | ICD-10-CM | POA: Insufficient documentation

## 2012-07-12 DIAGNOSIS — D61818 Other pancytopenia: Secondary | ICD-10-CM | POA: Insufficient documentation

## 2012-07-12 DIAGNOSIS — G252 Other specified forms of tremor: Secondary | ICD-10-CM | POA: Insufficient documentation

## 2012-07-12 DIAGNOSIS — I4891 Unspecified atrial fibrillation: Secondary | ICD-10-CM | POA: Insufficient documentation

## 2012-07-12 DIAGNOSIS — Z9489 Other transplanted organ and tissue status: Secondary | ICD-10-CM

## 2012-07-12 DIAGNOSIS — G729 Myopathy, unspecified: Secondary | ICD-10-CM | POA: Insufficient documentation

## 2012-07-12 DIAGNOSIS — E785 Hyperlipidemia, unspecified: Secondary | ICD-10-CM | POA: Insufficient documentation

## 2012-07-12 DIAGNOSIS — I10 Essential (primary) hypertension: Secondary | ICD-10-CM | POA: Insufficient documentation

## 2012-07-12 HISTORY — DX: Other transplanted organ and tissue status: Z94.89

## 2012-07-12 NOTE — Progress Notes (Signed)
ID: Timothy [name]   DOB: 1946-01-02  MR#: [id number]  WUJ#:[id number]  HISTORY OF PRESENT ILLNESS: We have very complete records from Dr. Sydnee Levans in [place], and in summary:  The patient was initially diagnosed in August 2000, with a white cell count of 23,600, but normal hemoglobin and platelets, and no significant symptomatology. Over the next several years his white cell count drifted up, and he eventually developed some symptoms of night sweats in particular, leading to treatment with fludarabine, Cytoxan and rituxan for five cycles given between December 2006 and May 2007.  We have CT scans from June 2006, November 2006 and April 2007, and comparing the November 2006 and April 2007 scans, there was near complete response. He had subsequent therapy in [place] as detailed below, but with decreased response, leading to allogeneic stem-cell transplant at the [facility] 02/24/2012.  INTERVAL HISTORY: Timothy [name] returns with his wife Timothy [name] for followup, upon his return from [place]. It was "a rough ride" over there, with multiple post transplant problems including a brief episode of atrial fibrillation, CMV serial conversion x2, significant steroid myopathy, and acute graft-versus-host disease involving chiefly the skin and gut. 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 He feels very fatigued, and walking up steps or down is hard. He does "okay" on a flat surface and is able to walk up to 40 minutes at a time, with a couple of brief stops. He has occasional problems with nausea, controlled with Ativan, and or occasional diarrhea. This is not constant. The stools are generally soft, will form, and occur once or twice a day. He swallows without pain although he has some mouth irritation, which he controls with rinses.  What bothers her most is a tremor in his hands and the difficulty walking. He bruises easily, has a hoarse voice, and occasionally notes bilateral ankle swelling. A detailed review of systems was otherwise noncontributory  PAST MEDICAL HISTORY: Past Medical History  Diagnosis Date  . 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 Diverticular disease   . Hyperlipidemia   . Obesity   . Hypertension   . Hiatal hernia   . CMV (cytomegalovirus) antibody positive     pre-transplant, with seroconversion x2 pst-transplant  . Right bundle branch block     pre-transplant  . CKD (chronic kidney disease) stage 2, GFR 60-89 ml/min   . Pancytopenia   . Steroid-induced diabetes   . Atrial fibrillation     post-transplant  . Myopathy   . Fine tremor     likely secondary to tacrolimus    PAST SURGICAL HISTORY: Past Surgical History  Procedure Date  . Tonsillectomy and adenoidectomy     FAMILY HISTORY No family history on file. The patient's father died from complications of chronic lymphocytic leukemia at the age of 39.  It had been diagnosed seven years before when he was 70.  The patient is enrolled in a familial chronic lymphocytic leukemia study out of the [facility].  The patient's mother is 70, alive, unfortunately suffering with dementia, and he has a brother, 66, who is otherwise in fair health.   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  His wife of >  40 years, Timothy [name], is a homemaker.  Their daughter, Timothy [name], lives in [place].  She also is a Futures trader.  The patient has an 56 year old grandson and an 72-year-old granddaughter, and that is really the main reason is moved to this area.  He is a International aid/development worker.     ADVANCED DIRECTIVES:  HEALTH MAINTENANCE: History  Substance Use Topics  . Smoking status: Not on file  .  Smokeless tobacco: Not on file  . Alcohol Use: Not on file     Colonoscopy:  PAP:  Bone density:  Lipid panel:  No Known Allergies  Current Outpatient Prescriptions  Medication Sig Dispense Refill  . acyclovir (ZOVIRAX) 800 MG tablet       . Alum & Mag Hydroxide-Simeth (MAGIC MOUTHWASH W/LIDOCAINE) SOLN Take 5 mLs by mouth 4 (four) times daily as needed.      Marland Kitchen amLODipine (NORVASC) 5 MG tablet Take 5 mg by mouth daily.        Marland Kitchen atorvastatin (LIPITOR) 10 MG tablet Take 10 mg by mouth daily.        . bisoprolol-hydrochlorothiazide (ZIAC) 10-6.25 MG per tablet       . budesonide (ENTOCORT EC) 3 MG 24 hr capsule       . fluconazole (DIFLUCAN) 200 MG tablet       . labetalol (NORMODYNE) 200 MG tablet       . levofloxacin (LEVAQUIN) 750 MG tablet       . lisinopril (PRINIVIL,ZESTRIL) 10 MG tablet       . LORazepam (ATIVAN) 0.5 MG tablet       . mycophenolate (CELLCEPT) 250 MG capsule       . predniSONE (DELTASONE) 20 MG tablet       . PRILOSEC OTC 20 MG tablet       . prochlorperazine (COMPAZINE) 10 MG tablet       . sertraline (ZOLOFT) 50 MG tablet       . SF 1.1 % GEL dental gel       . sulfamethoxazole-trimethoprim (BACTRIM DS) 800-160 MG per tablet       . tacrolimus (PROGRAF) 1 MG capsule       . trandolapril (MAVIK) 4 MG tablet Take 4 mg by mouth daily.        . ursodiol (ACTIGALL) 300 MG capsule       . Beclomethasone Dipropionate POWD In corn oil emulsion to equal 1mg  per ml  On tapered dose Take with food  100 g  0    OBJECTIVE: Middle-aged white male who appears fatigued Filed Vitals:   07/08/12 1104  BP: 156/84  Pulse: 80  Temp: 97.8 F (36.6 C)     Body mass index is 32.93 kg/(m^2).    ECOG FS: 2 Sclerae unicteric Oropharynx clear No cervical or supraclavicular adenopathy Lungs no rales or rhonchi, good excursion bilaterally Heart regular rate and rhythm, no murmur appreciated Abd soft, nontender, positive bowel sounds, no organomegaly MSK no focal spinal  tenderness, minimal bilateral ankle edema Neuro: nonfocal, appropriate affect, alert and oriented x3 Skin: No obvious rash  LAB RESULTS: Lab Results  Component Value Date   WBC 10.5* 07/08/2012   NEUTROABS 9.1* 07/08/2012   HGB 10.2* 07/08/2012   HCT 30.9* 07/08/2012   MCV 96.0 07/08/2012   PLT 76* 07/08/2012      Chemistry      Component Value Date/Time   NA 141 08/19/2011 0856   NA 141 08/19/2011 0856   K 4.2 08/19/2011 0856  K 4.2 08/19/2011 0856   CL 106 08/19/2011 0856   CL 106 08/19/2011 0856   CO2 21 08/19/2011 0856   CO2 21 08/19/2011 0856   BUN 18 08/19/2011 0856   BUN 18 08/19/2011 0856   CREATININE 0.91 08/19/2011 0856   CREATININE 0.91 08/19/2011 0856      Component Value Date/Time   CALCIUM 8.9 08/19/2011 0856   CALCIUM 8.9 08/19/2011 0856   ALKPHOS 57 08/19/2011 0856   ALKPHOS 57 08/19/2011 0856   AST 21 08/19/2011 0856   AST 21 08/19/2011 0856   ALT 16 08/19/2011 0856   ALT 16 08/19/2011 0856   BILITOT 0.8 08/19/2011 0856   BILITOT 0.8 08/19/2011 0856       No results found for this basename: LABCA2    No components found with this basename: LABCA125    No results found for this basename: INR:1;PROTIME:1 in the last 168 hours  Urinalysis No results found for this basename: colorurine,  appearanceur,  labspec,  phurine,  glucoseu,  hgbur,  bilirubinur,  ketonesur,  proteinur,  urobilinogen,  nitrite,  leukocytesur    STUDIES: Review of outside studies obtained in [place] included a CT of the head and sinuses with and without contrast 05/24/2012 showing normal brain and clear paranasal sinuses CT of the neck on 05/21/2012 showed no cervical lymphadenopathy or mass with unremarkable submandibular and parotid glands. The thyroid was normal. CT of the chest on the same day showed significant decrease in the large lymph nodes previously noted, with a left internal mammary lymph node measuring 9 mm and a left axillary lymph node at 10 mm. Largest pretracheal lymph node  measured 10 mm. Small hypoattenuating liver lesions were unchanged as compared to prior and his spleen size was stable as well. Bone density may 20 02/11/2012 showed normal T score is brain MRI 03/07/2012 was normal  ASSESSMENT: 66 y.o. 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  His treatment history is as follows  (1) fludarabine/cyclophosphamide/rituximab x5 completed May 2007.  (2) rituximab for 8 doses October 2010, with partial response  (3) Leustatin and ofatumumab weekly x8 July to September 2011 followed by maintenance ofatumumab maintenance ofatumumab every 2 months, with initial response but rising counts September 2012  (4) status-post unrelated donor stem-cell transplant 02/24/2012 at [facility]     (a) conditioning regimen consisted of fludarabine + TBI at 200 cGy, followed by rituximab x27     (b) CMV reactivation x2 (patient CMV positive, donor negative), s/p ganciclovir     (c) GVHD: involving gut and skin, treated with steroids, tacrolimus and MMF     (d) atrial fibrillation: resolved on amiodarone     (e) steroid-induced myopathy: improving   PLAN: ongoing issues include  (1) chronic GVHD prophylaxis: on steroid taper, MMF (CellCept), tacrolimus (Prograf), ursodiol and budesonide--- check tacrolimus levels weekly until stable, then broaden to monthly  (2) CMV reactivation: PCR weekly through October, then reassess  (3) hypogammaglobulinemia: received IgG at 400 mg/kg 07/09/2012; follow levels monthly  (4) immunocompromise: continue prophylaxis with zovirax, diflucan, levofloxacin and Bactrim  (5)  CGVHD assessment weekly initially, then at broadening intervals, with CBC, CMET, maggnesium weekly, IgG and fasting lipids monthly    Avory Rahimi C     07/12/2012

## 2012-07-12 NOTE — Telephone Encounter (Signed)
S/w the pt's wife and they are aware of the July,aug appts and to pick up a schedule

## 2012-07-13 ENCOUNTER — Other Ambulatory Visit: Payer: BC Managed Care – PPO | Admitting: Lab

## 2012-07-13 ENCOUNTER — Telehealth: Payer: Self-pay | Admitting: Emergency Medicine

## 2012-07-13 ENCOUNTER — Ambulatory Visit: Payer: BC Managed Care – PPO

## 2012-07-13 DIAGNOSIS — C9110 Chronic lymphocytic leukemia of B-cell type not having achieved remission: Secondary | ICD-10-CM

## 2012-07-13 MED ORDER — LISINOPRIL 10 MG PO TABS: 10.0000 mg | ORAL_TABLET | Freq: Every day | ORAL | Status: DC

## 2012-07-13 MED ORDER — URSODIOL 300 MG PO CAPS: 600.0000 mg | ORAL_CAPSULE | Freq: Two times a day (BID) | ORAL | Status: DC

## 2012-07-13 NOTE — Telephone Encounter (Signed)
Messaged received from patient with questions regarding hydration fluids and several med refills. Returned call and spoke with patient's wife. Informed her per Dr Darnelle Catalan that he can stop the IV hydration now that he is no longer on Gancyclovir. Per spouse his oral intake is about 2 liters per day. Instructed Mrs Couper to follow up with physicians in [place] to confirm that he can stop the IV fluids. Will also call in refills for patient for his Ursodiol and Lisinopril. Patient aware to return here tomorrow for labs.

## 2012-07-14 ENCOUNTER — Other Ambulatory Visit: Payer: BC Managed Care – PPO | Admitting: Lab

## 2012-07-14 ENCOUNTER — Other Ambulatory Visit (HOSPITAL_BASED_OUTPATIENT_CLINIC_OR_DEPARTMENT_OTHER): Payer: BC Managed Care – PPO | Admitting: Lab

## 2012-07-14 ENCOUNTER — Ambulatory Visit: Payer: BC Managed Care – PPO

## 2012-07-14 ENCOUNTER — Telehealth: Payer: Self-pay | Admitting: Oncology

## 2012-07-14 DIAGNOSIS — C9110 Chronic lymphocytic leukemia of B-cell type not having achieved remission: Secondary | ICD-10-CM

## 2012-07-14 DIAGNOSIS — Z9489 Other transplanted organ and tissue status: Secondary | ICD-10-CM

## 2012-07-14 LAB — CBC WITH DIFFERENTIAL/PLATELET
Basophils Absolute: 0 10*3/uL (ref 0.0–0.1)
HCT: 30.8 % — ABNORMAL LOW (ref 38.4–49.9)
HGB: 10.4 g/dL — ABNORMAL LOW (ref 13.0–17.1)
LYMPH%: 4.7 % — ABNORMAL LOW (ref 14.0–49.0)
MCH: 32.3 pg (ref 27.2–33.4)
MCHC: 33.7 g/dL (ref 32.0–36.0)
MONO#: 0.7 10*3/uL (ref 0.1–0.9)
NEUT%: 87.1 % — ABNORMAL HIGH (ref 39.0–75.0)
Platelets: 77 10*3/uL — ABNORMAL LOW (ref 140–400)
WBC: 9 10*3/uL (ref 4.0–10.3)
lymph#: 0.4 10*3/uL — ABNORMAL LOW (ref 0.9–3.3)

## 2012-07-14 NOTE — Telephone Encounter (Signed)
gve the pt his July,aug 2013 appt calendar

## 2012-07-15 ENCOUNTER — Ambulatory Visit: Payer: BC Managed Care – PPO

## 2012-07-15 ENCOUNTER — Telehealth: Payer: Self-pay | Admitting: Oncology

## 2012-07-15 ENCOUNTER — Other Ambulatory Visit: Payer: BC Managed Care – PPO | Admitting: Lab

## 2012-07-15 NOTE — Telephone Encounter (Signed)
Per GM due to a 10:30 am meeting on 8/2 pt's appt moved from 10 am to 9:30 am. S/w pt he is aware.

## 2012-07-16 ENCOUNTER — Encounter: Payer: Self-pay | Admitting: *Deleted

## 2012-07-16 ENCOUNTER — Encounter: Payer: Self-pay | Admitting: Physician Assistant

## 2012-07-16 ENCOUNTER — Other Ambulatory Visit: Payer: BC Managed Care – PPO | Admitting: Lab

## 2012-07-16 ENCOUNTER — Ambulatory Visit (HOSPITAL_BASED_OUTPATIENT_CLINIC_OR_DEPARTMENT_OTHER): Payer: BC Managed Care – PPO | Admitting: Physician Assistant

## 2012-07-16 ENCOUNTER — Ambulatory Visit (HOSPITAL_BASED_OUTPATIENT_CLINIC_OR_DEPARTMENT_OTHER): Payer: BC Managed Care – PPO

## 2012-07-16 ENCOUNTER — Ambulatory Visit: Payer: BC Managed Care – PPO

## 2012-07-16 ENCOUNTER — Telehealth: Payer: Self-pay | Admitting: Oncology

## 2012-07-16 VITALS — BP 120/73 | HR 89 | Temp 98.3°F | Ht 67.5 in | Wt 206.7 lb

## 2012-07-16 DIAGNOSIS — C9110 Chronic lymphocytic leukemia of B-cell type not having achieved remission: Secondary | ICD-10-CM

## 2012-07-16 DIAGNOSIS — Z9489 Other transplanted organ and tissue status: Secondary | ICD-10-CM

## 2012-07-16 DIAGNOSIS — D89811 Chronic graft-versus-host disease: Secondary | ICD-10-CM

## 2012-07-16 MED ORDER — DEXTROSE 5 % IV SOLN
1000.0000 mg | Freq: Once | INTRAVENOUS | Status: AC
  Administered 2012-07-16: 1000 mg via INTRAVENOUS
  Filled 2012-07-16: qty 2

## 2012-07-16 NOTE — Patient Instructions (Signed)
Hypomagnesemia Magnesium is a common ion (mineral) in the body which is needed for metabolism. It is about how the body handles food and other chemical reactions necessary for life. Only about 2% of the magnesium in our body is found in the blood. When this is low, it is called hypomagnesemia. The blood will measure only a tiny amount of the magnesium in our body. When it is low in our blood, it does not mean that the whole body supply is low. The normal serum concentration ranges from 1.8-2.5 mEq/L. When the level gets to be less than 1.0 mEq/L, a number of problems begin to happen.  CAUSES   Receiving intravenous fluids without magnesium replacement.   Loss of magnesium from the bowel by naso-gastric suction.   Loss of magnesium from nausea and vomiting or severe diarrhea. Any of the inflammatory bowel conditions can cause this.   Abuse of alcohol often leads to low serum magnesium.   An inherited form of magnesium loss happens when the kidneys lose magnesium. This is called familial or primary hypomagnesemia.   Some medications such as diuretics also cause the loss of magnesium.  SYMPTOMS  These following problems are worse if the changes in magnesium levels come on suddenly.  Tremor.   Confusion.   Muscle weakness.   Over-sensitive to sights and sounds.   Sensitive reflexes.   Depression.   Muscular fibrillations.   Over-reactivity of the nerves.   Irritability.   Psychosis.   Spasms of the hand muscles.   Tetany (where the muscles go into uncontrollable spasms).  DIAGNOSIS  This condition can be diagnosed by blood tests. TREATMENT   In emergency, magnesium can be given intravenously (by vein).   If the condition is less worrisome, it can be corrected by diet. High levels of magnesium are found in green leafy vegetables, peas, beans and nuts among other things. It can also be given through medications by mouth.   If it is being caused by medications, changes can  be made.   If alcohol is a problem, help is available if there are difficulties giving it up.  Document Released: 09/03/2005 Document Revised: 11/27/2011 Document Reviewed: 07/28/2008 Exit[employer] Patient Information 2012 [employer]. 

## 2012-07-16 NOTE — Progress Notes (Signed)
Due to need to access hickman for IV therap xray dated 04/05/2012 per [facility] at  [place] in [facility] states : "An intravenous catheter extends to the right atrium ".

## 2012-07-16 NOTE — Telephone Encounter (Signed)
Pt to pick up a revised aug 2013 appt calendar

## 2012-07-16 NOTE — Progress Notes (Signed)
ID: Timothy [name]   DOB: 06-29-1946  MR#: [id number]  WGN#:[id number]  HISTORY OF PRESENT ILLNESS: We have very complete records from Dr. Sydnee Levans in [place], and in summary:  The patient was initially diagnosed in August 2000, with a white cell count of 23,600, but normal hemoglobin and platelets, and no significant symptomatology. Over the next several years his white cell count drifted up, and he eventually developed some symptoms of night sweats in particular, leading to treatment with fludarabine, Cytoxan and rituxan for five cycles given between December 2006 and May 2007.  We have CT scans from June 2006, November 2006 and April 2007, and comparing the November 2006 and April 2007 scans, there was near complete response. He had subsequent therapy in [place] as detailed below, but with decreased response, leading to allogeneic stem-cell transplant at the [facility] 02/24/2012.  INTERVAL HISTORY: Timothy [name] returns with his wife [name] for followup, status post allogenic stem cell transplant in March. He is seeing her on weekly basis, along with weekly labs.  Interval history is unremarkable, and Timothy [name] is still fatigued, but is feeling more like himself. He is seeing gradual improvement on a weekly basis. He has no new complaints.   REVIEW OF SYSTEMS: He denies fever, bleeding, or rash. Of course he does have alopecia and some mild nail dyscrasia which has not changed. His mouth is sensitive, but he has no current ulcerations. This is not affecting his ability to eat or drink. His voice is still a little hoarse.  He has occasional nausea, but no emesis. He denies diarrhea, but occasionally has some loosely formed stools. No blood in the stool. No abdominal pain. He gets short of breath with exertion, but is able to walk on a flat surface without problems. He is using "trecking poles" to assist with ambulation. His hands still tremble which he finds bothersome.    A detailed  review of systems is otherwise stable and noncontributory. Please also see the Chronic Graft Versus Host Disease Assessment and Scoring Form  separately scanned into [place]'s medical records.     PAST MEDICAL HISTORY: Past Medical History  Diagnosis Date  . 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 Diverticular disease   . Hyperlipidemia   . Obesity   . Hypertension   . Hiatal hernia   . CMV (cytomegalovirus) antibody positive     pre-transplant, with seroconversion x2 pst-transplant  . Right bundle branch block     pre-transplant  . CKD (chronic kidney disease) stage 2, GFR 60-89 ml/min   . Pancytopenia   . Steroid-induced diabetes   . Atrial fibrillation     post-transplant  . Myopathy   . Fine tremor     likely secondary to tacrolimus    PAST SURGICAL HISTORY: Past Surgical History  Procedure Date  . Tonsillectomy and adenoidectomy     FAMILY HISTORY History reviewed. No pertinent family history. The patient's father died from complications of chronic lymphocytic leukemia at the age of 29.  It had been diagnosed seven years before when he was 80.  The patient is enrolled in a familial chronic lymphocytic leukemia study out of the [employer].  The patient's mother is 52, alive, unfortunately suffering with dementia, and he has a brother, 14, who is otherwise in fair health.   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  His wife of >40 years, [name], is a homemaker.  Their daughter, Timothy [name], lives in [place].  She also is a Futures trader.  The patient has an 3 year old grandson and an 39-year-old granddaughter, and that is really the main reason is moved to this area.  He is a International aid/development worker.     ADVANCED DIRECTIVES:  HEALTH MAINTENANCE: History  Substance Use Topics  . Smoking status: Never Smoker   .  Smokeless tobacco: Never Used  . Alcohol Use:      Colonoscopy:  PAP:  Bone density:  Lipid panel:  No Known Allergies  Current Outpatient Prescriptions  Medication Sig Dispense Refill  . acyclovir (ZOVIRAX) 800 MG tablet       . Alum & Mag Hydroxide-Simeth (MAGIC MOUTHWASH W/LIDOCAINE) SOLN Take 5 mLs by mouth 4 (four) times daily as needed.      Marland Kitchen amLODipine (NORVASC) 5 MG tablet Take 5 mg by mouth daily.        Marland Kitchen atorvastatin (LIPITOR) 10 MG tablet Take 10 mg by mouth daily.        . Beclomethasone Dipropionate POWD In corn oil emulsion to equal 1mg  per ml  On tapered dose Take with food  100 g  0  . bisoprolol-hydrochlorothiazide (ZIAC) 10-6.25 MG per tablet       . budesonide (ENTOCORT EC) 3 MG 24 hr capsule       . fluconazole (DIFLUCAN) 200 MG tablet       . labetalol (NORMODYNE) 200 MG tablet       . levofloxacin (LEVAQUIN) 750 MG tablet       . lisinopril (PRINIVIL,ZESTRIL) 10 MG tablet Take 1 tablet (10 mg total) by mouth daily.  90 tablet  11  . LORazepam (ATIVAN) 0.5 MG tablet       . mycophenolate (CELLCEPT) 250 MG capsule       . predniSONE (DELTASONE) 20 MG tablet       . PRILOSEC OTC 20 MG tablet       . prochlorperazine (COMPAZINE) 10 MG tablet       . sertraline (ZOLOFT) 50 MG tablet       . SF 1.1 % GEL dental gel       . sulfamethoxazole-trimethoprim (BACTRIM DS) 800-160 MG per tablet       . tacrolimus (PROGRAF) 1 MG capsule       . trandolapril (MAVIK) 4 MG tablet Take 4 mg by mouth daily.        . ursodiol (ACTIGALL) 300 MG capsule Take 2 capsules (600 mg total) by mouth 2 (two) times daily.  360 capsule  12   Current Facility-Administered Medications  Medication Dose Route Frequency Provider Last Rate Last Dose  . magnesium sulfate 1,000 mg in dextrose 5 % 50 mL IVPB  1,000 mg Intravenous Once Catalina Gravel, PA 52 mL/hr at 07/16/12 1204 1,000 mg at 07/16/12 1204    OBJECTIVE: Middle-aged white male who appears fatigued Filed Vitals:   07/16/12 1016    BP: 120/73  Pulse: 89  Temp: 98.3 F (36.8 C)     Body mass index is 31.90 kg/(m^2).    ECOG FS: 2 Sclerae anicteric, conjunctiva pink. Oropharynx benign.  No cervical or supraclavicular adenopathy Lungs  clear to auscultation bilaterally with no rales or rhonchi, good excursion bilaterally Heart regular rate and rhythm, no murmur appreciated Abd soft, nontender, positive bowel sounds, no organomegaly MSK no focal spinal tenderness, minimal bilateral ankle edema, nonpitting and equal bilaterally. No  significant limits in ROM. Neuro: nonfocal, appropriate affect, alert and oriented x3 Skin: Benign with no obvious rash  LAB RESULTS: Lab Results  Component Value Date   WBC 9.0 07/14/2012   NEUTROABS 7.9* 07/14/2012   HGB 10.4* 07/14/2012   HCT 30.8* 07/14/2012   MCV 95.7 07/14/2012   PLT 77* 07/14/2012      Chemistry      Component Value Date/Time   NA 139 07/14/2012 1036   K 4.1 07/14/2012 1036   CL 106 07/14/2012 1036   CO2 22 07/14/2012 1036   BUN 31* 07/14/2012 1036   CREATININE 1.32 07/14/2012 1036      Component Value Date/Time   CALCIUM 8.8 07/14/2012 1036   ALKPHOS 52 07/14/2012 1036   AST 30 07/14/2012 1036   ALT 22 07/14/2012 1036   BILITOT 0.3 07/14/2012 1036     Magnesium was low at 1.3 on 07/14/2012.  Lipid Panel     Component Value Date/Time   CHOL 209* 07/14/2012 1036   TRIG 606* 07/14/2012 1036   HDL 27* 07/14/2012 1036   CHOLHDL 7.7 07/14/2012 1036   VLDL NOT CALC 07/14/2012 1036   LDLCALC ** 07/14/2012 1036   Tacrolimus Level  Was normal at 8.7 on 07/14/2012  Also on 07/14/2012:  IgG = 743;   IgA = 7;   IgM = <5  STUDIES: Review of outside studies obtained in [place] included a CT of the head and sinuses with and without contrast 05/24/2012 showing normal brain and clear paranasal sinuses CT of the neck on 05/21/2012 showed no cervical lymphadenopathy or mass with unremarkable submandibular and parotid glands. The thyroid was normal. CT of the chest on the same day  showed significant decrease in the large lymph nodes previously noted, with a left internal mammary lymph node measuring 9 mm and a left axillary lymph node at 10 mm. Largest pretracheal lymph node measured 10 mm. Small hypoattenuating liver lesions were unchanged as compared to prior and his spleen size was stable as well. Bone density may 20 02/11/2012 showed normal T score is brain MRI 03/07/2012 was normal  ASSESSMENT: 66 y.o. [place] man with a history of well-differentiated lymphocytic lymphoma/ chronic lymphoid leukemia initially diagnosed in 2000, not requiring intervention until 2006; with multiple chromosomal abnormalities including 17 (not at the site of p53 mutation).  His treatment history is as follows  (1) fludarabine/cyclophosphamide/rituximab x5 completed May 2007.  (2) rituximab for 8 doses October 2010, with partial response  (3) Leustatin and ofatumumab weekly x8 July to September 2011 followed by maintenance ofatumumab maintenance ofatumumab every 2 months, with initial response but rising counts September 2012  (4) status-post unrelated donor stem-cell transplant 02/24/2012 at the [facility]     (a) conditioning regimen consisted of fludarabine + TBI at 200 cGy, followed by rituximab x27     (b) CMV reactivation x2 (patient CMV positive, donor negative), s/p ganciclovir     (c) GVHD: involving gut and skin, treated with steroids, tacrolimus and MMF     (d) atrial fibrillation: resolved on amiodarone     (e) steroid-induced myopathy: improving   PLAN: ongoing issues include  (1) chronic GVHD prophylaxis: on steroid taper, MMF (CellCept), tacrolimus (Prograf), ursodiol and budesonide--- check tacrolimus levels weekly until stable, then broaden to monthly  (2) CMV reactivation: PCR monthly through October, then reassess  (3) hypogammaglobulinemia: received IgG at 400 mg/kg 07/09/2012; follow levels monthly  (4) immunocompromise: continue prophylaxis with zovirax,  diflucan, levofloxacin and Bactrim  (5)  CGVHD  assessment weekly initially, then at broadening intervals, with CBC, CMET, maggnesium weekly, IgG, CMV PCR,  and fasting lipids monthly.  Estevon was also found to have a low magnesium this past week at 1.3. He'll receive 1 g of magnesium IV today. Ed[place] is scheduled to return for repeat labs next week on July 31, and will see Dr. Darnelle Catalan on Friday, August 2. They'll call meanwhile with any changes or problems.     Derenda Giddings    07/16/2012

## 2012-07-16 NOTE — Progress Notes (Signed)
Timothy [name] flushed with 250units heparin and 10cc Normal Saline in distal, and 250units heparin and 10ml normal saline in proximal lumens of DL Timothy [name] Catheter.  Positive blood return.  New caps also placed.  SLJ

## 2012-07-17 ENCOUNTER — Other Ambulatory Visit: Payer: Self-pay | Admitting: Oncology

## 2012-07-17 ENCOUNTER — Ambulatory Visit: Payer: BC Managed Care – PPO

## 2012-07-17 DIAGNOSIS — C9110 Chronic lymphocytic leukemia of B-cell type not having achieved remission: Secondary | ICD-10-CM

## 2012-07-17 DIAGNOSIS — IMO0002 Reserved for concepts with insufficient information to code with codable children: Secondary | ICD-10-CM

## 2012-07-19 ENCOUNTER — Telehealth: Payer: Self-pay | Admitting: Oncology

## 2012-07-19 ENCOUNTER — Other Ambulatory Visit: Payer: BC Managed Care – PPO | Admitting: Lab

## 2012-07-19 ENCOUNTER — Ambulatory Visit: Payer: BC Managed Care – PPO

## 2012-07-19 LAB — COMPREHENSIVE METABOLIC PANEL
BUN: 31 mg/dL — ABNORMAL HIGH (ref 6–23)
CO2: 22 mEq/L (ref 19–32)
Calcium: 8.8 mg/dL (ref 8.4–10.5)
Chloride: 106 mEq/L (ref 96–112)
Creatinine, Ser: 1.32 mg/dL (ref 0.50–1.35)
Total Bilirubin: 0.3 mg/dL (ref 0.3–1.2)

## 2012-07-19 LAB — TACROLIMUS LEVEL: Tacrolimus Lvl: 8.7 ng/mL (ref 5.0–20.0)

## 2012-07-19 LAB — LIPID PANEL
Cholesterol: 209 mg/dL — ABNORMAL HIGH (ref 0–200)
HDL: 27 mg/dL — ABNORMAL LOW (ref >39)
Total CHOL/HDL Ratio: 7.7 Ratio
Triglycerides: 606 mg/dL — ABNORMAL HIGH (ref <150)

## 2012-07-19 LAB — IGG, IGA, IGM: IgG (Immunoglobin G), Serum: 743 mg/dL (ref 650–1600)

## 2012-07-19 LAB — MAGNESIUM: Magnesium: 1.3 mg/dL — ABNORMAL LOW (ref 1.5–2.5)

## 2012-07-19 NOTE — Telephone Encounter (Signed)
Per GM moved 8/16 appt to 8/14 w/AB @ 3:45 pm. Per AB she cannot see pt @ 3:45 pm but can see him @ 2:45 pm (break). S/w pt today re new d/t for 8/14 and pt also made aware that he is to keep flush appt for 8/16.

## 2012-07-20 ENCOUNTER — Encounter (HOSPITAL_COMMUNITY): Payer: Self-pay | Admitting: Dentistry

## 2012-07-20 ENCOUNTER — Other Ambulatory Visit: Payer: BC Managed Care – PPO | Admitting: Lab

## 2012-07-20 ENCOUNTER — Ambulatory Visit (HOSPITAL_COMMUNITY): Payer: BC Managed Care – PPO | Admitting: Dentistry

## 2012-07-20 ENCOUNTER — Ambulatory Visit: Payer: BC Managed Care – PPO

## 2012-07-20 VITALS — BP 128/78 | HR 82

## 2012-07-20 DIAGNOSIS — C9110 Chronic lymphocytic leukemia of B-cell type not having achieved remission: Secondary | ICD-10-CM

## 2012-07-20 DIAGNOSIS — K036 Deposits [accretions] on teeth: Secondary | ICD-10-CM

## 2012-07-20 DIAGNOSIS — Z9481 Bone marrow transplant status: Secondary | ICD-10-CM

## 2012-07-20 DIAGNOSIS — K121 Other forms of stomatitis: Secondary | ICD-10-CM

## 2012-07-20 DIAGNOSIS — K0530 Chronic periodontitis, unspecified: Secondary | ICD-10-CM

## 2012-07-20 LAB — CMV (CYTOMEGALOVIRUS) DNA ULTRAQUANT, PCR

## 2012-07-20 NOTE — Progress Notes (Signed)
DENTAL CONSULTATION  Date of Consultation:  07/20/2012 Patient Name:   Timothy [name] Date of Birth:   Apr 01, 1946 Medical Record Number: [id number]  VITALS: BP 128/78  Pulse 82   CHIEF COMPLAINT: Patient referred for dental consultation after bone marrow transplantation.  HPI: Patient with history of chronic lymphoid leukemia since 2000.  Patient has had multiple cycles of chemotherapy since that time. Patient currently is status post allogenic stem cell transplantation on 02/24/2012 at the [facility] in [place], [place].  Patient is now seen by Dr. Jeanette Caprice in Medical Oncology.  Patient was referred by Dr. Darnelle Catalan reevaluation of oral condition and possible graft-versus-host disease. Patient was seen for a dental consultation prior to the stem cell transplant in [place]. Patient saw Dr. Ripley Fraise at that time. Patient was last seen by Dr. Elenore Paddy 06/30/2012 and was prescribed the dexamethasone elixir for possible graft-versus-host disease involving the right soft palate tissues.  Patient now presents for evaluation by Dental Medicine to determine if he will able to seen for his overall comprehensive dental needs.  Patient currently denies any acute toothache, swellings or abscesses.  Patient does note that he has an area of ulceration involving the right soft palate. Patient indicates that this has been present since early July of 2013 and was previously evaluated in [place], [place].  Patient previously had been seen by Dr. Italy Merrill and Dr. Reva Bores for dental care in [place], [place].  Patient usually sees the dentist on every 6 month basis. Patient indicates that he alternates between the general practitioner and the periodontist.    Patient Active Problem List  Diagnosis  . Leukemia, chronic lymphoid  . Transplant recipient  . 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 Diverticular disease  . Hyperlipidemia  . Obesity  .  Hypertension  . Hiatal hernia  . CMV (cytomegalovirus) antibody positive  . Right bundle branch block  . CKD (chronic kidney disease) stage 2, GFR 60-89 ml/min  . Pancytopenia  . Steroid-induced diabetes  . Atrial fibrillation  . Myopathy  . Fine tremor   PMH: Past Medical History  Diagnosis Date  . 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 Diverticular disease   . Hyperlipidemia   . Obesity   . Hypertension   . Hiatal hernia   . CMV (cytomegalovirus) antibody positive     pre-transplant, with seroconversion x2 pst-transplant  . Right bundle branch block     pre-transplant  . CKD (chronic kidney disease) stage 2, GFR 60-89 ml/min   . Pancytopenia   . Steroid-induced diabetes   . Atrial fibrillation     post-transplant  . Myopathy   . Fine tremor     likely secondary to tacrolimus    PSH: Past Surgical History  Procedure Date  . Tonsillectomy and adenoidectomy     ALLERGIES: No Known Allergies  MEDICATIONS: Current Outpatient Prescriptions  Medication Sig Dispense Refill  . acyclovir (ZOVIRAX) 800 MG tablet       . Alum & Mag Hydroxide-Simeth (MAGIC MOUTHWASH W/LIDOCAINE) SOLN Take 5 mLs by mouth 4 (four) times daily as needed.      Marland Kitchen amLODipine (NORVASC) 5 MG tablet Take 5 mg by mouth daily.        . Beclomethasone Dipropionate POWD In corn oil emulsion to equal 1mg  per ml  On tapered dose Take with food  100 g  0  . budesonide (ENTOCORT EC) 3 MG 24 hr capsule       .  Calcium Citrate (CITRACAL PO) Take 1,200 mg by mouth 2 (two) times daily.      . cholecalciferol (VITAMIN D) 1000 UNITS tablet Take 1,000 Units by mouth daily.      Marland Kitchen dexamethasone 0.5 MG/5ML elixir Take by mouth as directed. Rinse with 5-10 mL for 4 to 5 minutes 4-6 times daily. Use as a swish and spit rinse.      . fluconazole (DIFLUCAN) 200 MG tablet       . labetalol (NORMODYNE) 200 MG tablet       . levofloxacin (LEVAQUIN) 750 MG tablet       . lisinopril  (PRINIVIL,ZESTRIL) 10 MG tablet Take 1 tablet (10 mg total) by mouth daily.  90 tablet  11  . LORazepam (ATIVAN) 0.5 MG tablet       . Multiple Vitamin (MULTIVITAMIN) tablet Take 1 tablet by mouth daily.      Marland Kitchen omega-3 acid ethyl esters (LOVAZA) 1 G capsule Take 1 g by mouth 2 (two) times daily.      . predniSONE (DELTASONE) 20 MG tablet       . PRILOSEC OTC 20 MG tablet       . sertraline (ZOLOFT) 50 MG tablet       . SF 1.1 % GEL dental gel       . sulfamethoxazole-trimethoprim (BACTRIM DS) 800-160 MG per tablet       . tacrolimus (PROGRAF) 1 MG capsule       . ursodiol (ACTIGALL) 300 MG capsule Take 2 capsules (600 mg total) by mouth 2 (two) times daily.  360 capsule  12  . atorvastatin (LIPITOR) 10 MG tablet Take 10 mg by mouth daily.        . bisoprolol-hydrochlorothiazide (ZIAC) 10-6.25 MG per tablet       . mycophenolate (CELLCEPT) 250 MG capsule       . prochlorperazine (COMPAZINE) 10 MG tablet       . trandolapril (MAVIK) 4 MG tablet Take 4 mg by mouth daily.          LABS: Lab Results  Component Value Date   WBC 9.0 07/14/2012   HGB 10.4* 07/14/2012   HCT 30.8* 07/14/2012   MCV 95.7 07/14/2012   PLT 77* 07/14/2012      Component Value Date/Time   NA 139 07/14/2012 1036   K 4.1 07/14/2012 1036   CL 106 07/14/2012 1036   CO2 22 07/14/2012 1036   GLUCOSE 113* 07/14/2012 1036   BUN 31* 07/14/2012 1036   CREATININE 1.32 07/14/2012 1036   CALCIUM 8.8 07/14/2012 1036   No results found for this basename: INR, PROTIME   No results found for this basename: PTT    SOCIAL HISTORY: History   Social History  . Marital Status: Single    Spouse Name: N/A    Number of Children: N/A  . Years of Education: N/A   Occupational History  . Not on file.   Social History Main Topics  . Smoking status: Never Smoker   . Smokeless tobacco: Never Used  . Alcohol Use:   . Drug Use: No  . Sexually Active:    Other Topics Concern  . 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 Patient currently to his part-time at [employer]. The patient is married to Elsie, and has been married for 40 years. 1 daughter [name] who lives in [place].  FAMILY HISTORY: The patient's father died from complications of chronic lymphocytic leukemia at 50. It had been diagnosed 7 years before when he was 70. Patient is enrolled in the familial chronic lymphocytic  leukemia study of the [employer]. The patient's mother is 49, alive, and unfortunately suffering from dementia. The patient has a brother who is 38 and is otherwise healthy.   DENTAL EXAMINATION:  GENERAL: Patient is a well-developed, well-nourished male in no acute distress. INTRAORAL EXAM: The patient has no evidence of abscess formation. The patient has a 5 mm x 5 mm ulceration involving the right soft palate. Patient has bilateral, multilobular mandibular lingual tori. DENTITION:  The patient clinically appears to be missing tooth numbers 1, 3, 5, 13, 16, 17, and 32. Most missing teeth have been replaced with crown and bridge therapy. PERIODONTAL: Patient with a history of chronic periodontitis.  Patient has minimal plaque accumulations noted.  There is no obvious tooth mobility noted. Periodontal charting was not performed today per bone marrow transplant protocol. DENTAL CARIES/SUBOPTIMAL RESTORATIONS: There are no obvious dental caries noted. Incisal chip of tooth #23 was noted. Patient ideally needs a full series of dental radiographs to rule out other incipient dental caries. ENDODONTIC: Patient currently denies acute pulpitis symptoms. CROWN AND BRIDGE: Patient has multiple crown and bridge restorations. Fractured porcelain on the pontic of tooth #13 was noted but appears to be functional. PROSTHODONTIC: No history of partial dentures. OCCLUSION: The occlusion appears to be stable at this time.Marland Kitchen  RADIOGRAPHIC INTERPRETATION: No radiographs were obtained today.  Dental radiographs were obtained in [place], [place].   ASSESSMENTS: 1. Ulceration involving the soft tissue of the right soft palate. This measures 5 mm x 5 mm. Patient currently is using dexamethasone elixir in a swish and spit manner of 4-5 times daily per directions of previous dentist in [place], [place]. 2. Chronic periodontitis 3. Minimal plaque accumulations 4. Fractured porcelain on the pontic of tooth #13. 5. Need for dental expertise in treating patients with bone marrow transplantation. Most likely will be at [facility] in [place]. I will assist patient and Dr. Darnelle Catalan in getting patient referred to [facility] for comprehensive dental care.   PLAN/RECOMMENDATIONS: 1. I discussed the need for referral to a dentist with expertise in treating patients that have undergone bone marrow transplantation.  I will attempt to contact [facility] and assist in referral as indicated.  2. Discussion of findings with medical team as indicated.  Charlynne Pander, DDS

## 2012-07-21 ENCOUNTER — Ambulatory Visit: Payer: BC Managed Care – PPO

## 2012-07-21 ENCOUNTER — Other Ambulatory Visit (HOSPITAL_BASED_OUTPATIENT_CLINIC_OR_DEPARTMENT_OTHER): Payer: BC Managed Care – PPO

## 2012-07-21 ENCOUNTER — Other Ambulatory Visit: Payer: BC Managed Care – PPO | Admitting: Lab

## 2012-07-21 DIAGNOSIS — D89811 Chronic graft-versus-host disease: Secondary | ICD-10-CM

## 2012-07-21 DIAGNOSIS — Z9489 Other transplanted organ and tissue status: Secondary | ICD-10-CM

## 2012-07-21 DIAGNOSIS — C9110 Chronic lymphocytic leukemia of B-cell type not having achieved remission: Secondary | ICD-10-CM

## 2012-07-21 LAB — CBC WITH DIFFERENTIAL/PLATELET
Eosinophils Absolute: 0 10*3/uL (ref 0.0–0.5)
LYMPH%: 2.7 % — ABNORMAL LOW (ref 14.0–49.0)
MCV: 97.1 fL (ref 79.3–98.0)
MONO%: 7.1 % (ref 0.0–14.0)
NEUT#: 9.7 10*3/uL — ABNORMAL HIGH (ref 1.5–6.5)
Platelets: 94 10*3/uL — ABNORMAL LOW (ref 140–400)
RBC: 3.03 10*6/uL — ABNORMAL LOW (ref 4.20–5.82)

## 2012-07-22 ENCOUNTER — Other Ambulatory Visit: Payer: Self-pay | Admitting: *Deleted

## 2012-07-22 ENCOUNTER — Ambulatory Visit: Payer: BC Managed Care – PPO

## 2012-07-23 ENCOUNTER — Other Ambulatory Visit: Payer: Self-pay | Admitting: Emergency Medicine

## 2012-07-23 ENCOUNTER — Ambulatory Visit: Payer: BC Managed Care – PPO

## 2012-07-23 ENCOUNTER — Other Ambulatory Visit: Payer: Self-pay | Admitting: Oncology

## 2012-07-23 ENCOUNTER — Ambulatory Visit (HOSPITAL_BASED_OUTPATIENT_CLINIC_OR_DEPARTMENT_OTHER): Payer: BC Managed Care – PPO | Admitting: Oncology

## 2012-07-23 ENCOUNTER — Telehealth: Payer: Self-pay | Admitting: Emergency Medicine

## 2012-07-23 ENCOUNTER — Encounter: Payer: Self-pay | Admitting: Medical Oncology

## 2012-07-23 ENCOUNTER — Ambulatory Visit (HOSPITAL_BASED_OUTPATIENT_CLINIC_OR_DEPARTMENT_OTHER): Payer: BC Managed Care – PPO

## 2012-07-23 ENCOUNTER — Telehealth: Payer: Self-pay | Admitting: Oncology

## 2012-07-23 VITALS — BP 124/77 | HR 82 | Temp 97.7°F | Resp 20 | Ht 67.5 in | Wt 205.2 lb

## 2012-07-23 DIAGNOSIS — C9110 Chronic lymphocytic leukemia of B-cell type not having achieved remission: Secondary | ICD-10-CM

## 2012-07-23 DIAGNOSIS — Z9489 Other transplanted organ and tissue status: Secondary | ICD-10-CM

## 2012-07-23 DIAGNOSIS — D89811 Chronic graft-versus-host disease: Secondary | ICD-10-CM

## 2012-07-23 DIAGNOSIS — R768 Other specified abnormal immunological findings in serum: Secondary | ICD-10-CM

## 2012-07-23 DIAGNOSIS — R894 Abnormal immunological findings in specimens from other organs, systems and tissues: Secondary | ICD-10-CM

## 2012-07-23 DIAGNOSIS — D801 Nonfamilial hypogammaglobulinemia: Secondary | ICD-10-CM

## 2012-07-23 LAB — COMPREHENSIVE METABOLIC PANEL
Alkaline Phosphatase: 49 U/L (ref 39–117)
BUN: 44 mg/dL — ABNORMAL HIGH (ref 6–23)
Glucose, Bld: 137 mg/dL — ABNORMAL HIGH (ref 70–99)
Sodium: 138 mEq/L (ref 135–145)
Total Bilirubin: 0.3 mg/dL (ref 0.3–1.2)

## 2012-07-23 LAB — CMV (CYTOMEGALOVIRUS) DNA ULTRAQUANT, PCR

## 2012-07-23 LAB — TACROLIMUS LEVEL: Tacrolimus Lvl: 3.9 ng/mL — ABNORMAL LOW (ref 5.0–20.0)

## 2012-07-23 MED ORDER — BECLOMETHASONE DIPROPIONATE POWD: Status: DC

## 2012-07-23 MED ORDER — LABETALOL HCL 200 MG PO TABS: 200.0000 mg | ORAL_TABLET | Freq: Two times a day (BID) | ORAL | Status: DC

## 2012-07-23 MED ORDER — BUDESONIDE 3 MG PO CP24: 3.0000 mg | ORAL_CAPSULE | ORAL | Status: DC

## 2012-07-23 MED ORDER — LEVOFLOXACIN 750 MG PO TABS: 750.0000 mg | ORAL_TABLET | Freq: Every day | ORAL | Status: DC

## 2012-07-23 MED ORDER — FLUCONAZOLE 200 MG PO TABS: 200.0000 mg | ORAL_TABLET | Freq: Every day | ORAL | Status: DC

## 2012-07-23 MED ORDER — LORAZEPAM 0.5 MG PO TABS: 0.5000 mg | ORAL_TABLET | Freq: Four times a day (QID) | ORAL | Status: DC | PRN

## 2012-07-23 MED ORDER — MAGNESIUM SULFATE 50 % IJ SOLN
1000.0000 mg | Freq: Once | INTRAVENOUS | Status: DC
  Administered 2012-07-23: 1000 mg via INTRAVENOUS
  Filled 2012-07-23: qty 2

## 2012-07-23 MED ORDER — SODIUM CHLORIDE 0.9 % IV SOLN
INTRAVENOUS | Status: AC
  Administered 2012-07-23: 17:00:00 via INTRAVENOUS

## 2012-07-23 MED ORDER — TACROLIMUS 1 MG PO CAPS: 5.0000 mg | ORAL_CAPSULE | Freq: Two times a day (BID) | ORAL | Status: DC

## 2012-07-23 NOTE — Telephone Encounter (Signed)
Added infusion appts only for today and the next 3 fridays. Pt aware and will get schedule when he comes back today.

## 2012-07-23 NOTE — Progress Notes (Signed)
ID: [name]   DOB: [birth year]  MR#: [id number]  ION#:[id number]  HISTORY OF PRESENT ILLNESS: We have very complete records from Dr. Sydnee Levans in [place], and in summary:  The patient was initially diagnosed in August 2000, with a white cell count of 23,600, but normal hemoglobin and platelets, and no significant symptomatology. Over the next several years his white cell count drifted up, and he eventually developed some symptoms of night sweats in particular, leading to treatment with fludarabine, Cytoxan and rituxan for five cycles given between December 2006 and May 2007.  We have CT scans from June 2006, November 2006 and April 2007, and comparing the November 2006 and April 2007 scans, there was near complete response. He had subsequent therapy in [place] as detailed below, but with decreased response, leading to allogeneic stem-cell transplant at the [facility] 02/24/2012.  INTERVAL HISTORY: Donal returns with his wife [name] for followup, status post allogenic stem cell transplant in March 2013. We are seeing him weekly, with labwork every Wednesday, and of course he knows to call for any intervening problems that may develop.  REVIEW OF SYSTEMS: Overall he is doing remarkably well. He is moderately fatigued, but continues to work at increasing his functional status. He has difficulty walking up stairs. He feels a little dizzy sometimes. The biggest problem he is having is his mouth, which doesn't feel right. There is no pain, no odynophagia, no difficulty swallowing, but it "doesn't feel right" despite using steroid washes 5 times a day. There has been no fever, no rash, no bleeding, bowel movements are normal (2 normal once yesterday), and bladder function is likewise normal. There has been no cough, phlegm production, pleurisy, or hemoptysis. He continues to have a fine tremor. A detailed review of systems today was otherwise noncontributory.  PAST MEDICAL  HISTORY: Past Medical History  Diagnosis Date  . 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 Diverticular disease   . Hyperlipidemia   . Obesity   . Hypertension   . Hiatal hernia   . CMV (cytomegalovirus) antibody positive     pre-transplant, with seroconversion x2 pst-transplant  . Right bundle branch block     pre-transplant  . CKD (chronic kidney disease) stage 2, GFR 60-89 ml/min   . Pancytopenia   . Steroid-induced diabetes   . Atrial fibrillation     post-transplant  . Myopathy   . Fine tremor     likely secondary to tacrolimus    PAST SURGICAL HISTORY: Past Surgical History  Procedure Date  . Tonsillectomy and adenoidectomy     FAMILY HISTORY Family History  Problem Relation Age of Onset  . Cancer Father    The patient's father died from complications of chronic lymphocytic leukemia at the age of 73.  It had been diagnosed seven years before when he was 3.  The patient is enrolled in a familial chronic lymphocytic leukemia study out of the [facility].  The patient's mother is 11, alive, unfortunately suffering with dementia, and he has a brother, 46, who is otherwise in fair health.   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  His wife of >40 years, [name], is a homemaker.  Their daughter, [name], lives in [place].  She also is a Futures trader.  The patient has an 4 year old grandson and an 58-year-old granddaughter, and that is really  the main reason is moved to this area.  He is a International aid/development worker.     ADVANCED DIRECTIVES:  HEALTH MAINTENANCE: History  Substance Use Topics  . Smoking status: Never Smoker   . Smokeless tobacco: Never Used  . Alcohol Use:      Colonoscopy:  PAP:  Bone density:  Lipid panel:  No Known Allergies  Current Outpatient Prescriptions  Medication Sig Dispense  Refill  . acyclovir (ZOVIRAX) 800 MG tablet       . Alum & Mag Hydroxide-Simeth (MAGIC MOUTHWASH W/LIDOCAINE) SOLN Take 5 mLs by mouth 4 (four) times daily as needed.      Marland Kitchen amLODipine (NORVASC) 5 MG tablet Take 5 mg by mouth daily.        Marland Kitchen atorvastatin (LIPITOR) 10 MG tablet Take 10 mg by mouth daily.        . Beclomethasone Dipropionate POWD In corn oil emulsion to equal 1mg  per ml  On tapered dose Take with food  200 g  2  . bisoprolol-hydrochlorothiazide (ZIAC) 10-6.25 MG per tablet       . budesonide (ENTOCORT EC) 3 MG 24 hr capsule Take 1 capsule (3 mg total) by mouth every morning.  60 capsule  12  . Calcium Citrate (CITRACAL PO) Take 1,200 mg by mouth 2 (two) times daily.      . cholecalciferol (VITAMIN D) 1000 UNITS tablet Take 1,000 Units by mouth daily.      Marland Kitchen dexamethasone 0.5 MG/5ML elixir Take by mouth as directed. Rinse with 5-10 mL for 4 to 5 minutes 4-6 times daily. Use as a swish and spit rinse.      . fluconazole (DIFLUCAN) 200 MG tablet Take 1 tablet (200 mg total) by mouth daily.  30 tablet  6  . labetalol (NORMODYNE) 200 MG tablet Take 1 tablet (200 mg total) by mouth 2 (two) times daily.  60 tablet  6  . levofloxacin (LEVAQUIN) 750 MG tablet Take 1 tablet (750 mg total) by mouth daily.  30 tablet  12  . lisinopril (PRINIVIL,ZESTRIL) 10 MG tablet Take 1 tablet (10 mg total) by mouth daily.  90 tablet  11  . LORazepam (ATIVAN) 0.5 MG tablet Take 1 tablet (0.5 mg total) by mouth every 6 (six) hours as needed for anxiety.  120 tablet  2  . Multiple Vitamin (MULTIVITAMIN) tablet Take 1 tablet by mouth daily.      . mycophenolate (CELLCEPT) 250 MG capsule       . omega-3 acid ethyl esters (LOVAZA) 1 G capsule Take 1 g by mouth 2 (two) times daily.      . predniSONE (DELTASONE) 20 MG tablet       . PRILOSEC OTC 20 MG tablet       . prochlorperazine (COMPAZINE) 10 MG tablet       . sertraline (ZOLOFT) 50 MG tablet       . SF 1.1 % GEL dental gel       .  sulfamethoxazole-trimethoprim (BACTRIM DS) 800-160 MG per tablet       . tacrolimus (PROGRAF) 1 MG capsule Take 5 capsules (5 mg total) by mouth 2 (two) times daily.  60 capsule  3  . trandolapril (MAVIK) 4 MG tablet Take 4 mg by mouth daily.        . ursodiol (ACTIGALL) 300 MG capsule Take 2 capsules (600 mg total) by mouth 2 (two) times daily.  360 capsule  12  . DISCONTD: Beclomethasone Dipropionate POWD In corn oil  emulsion to equal 1mg  per ml  On tapered dose Take with food  100 g  0  . DISCONTD: labetalol (NORMODYNE) 200 MG tablet        Current Facility-Administered Medications  Medication Dose Route Frequency Provider Last Rate Last Dose  . magnesium sulfate 1,000 mg in dextrose 5 % 50 mL IVPB  1,000 mg Intravenous Once Lowella Dell, MD        OBJECTIVE: Middle-aged white male in no acute distress Filed Vitals:   07/23/12 0952  BP: 124/77  Pulse: 82  Temp: 97.7 F (36.5 C)  Resp: 20     Body mass index is 31.66 kg/(m^2).    ECOG FS: 2 Sclerae anicteric.Marland Kitchen Oropharynx shows no thrush, no ulcers, no lesions, no erythema.  No cervical or supraclavicular adenopathy Lungs  clear to auscultation bilaterally, good excursion bilaterally Heart regular rate and rhythm, no murmur appreciated Abd soft, nontender, positive bowel sounds, no organomegaly MSK no focal spinal tenderness, minimal bilateral ankle edema,  No significant limits in ROM. Neuro: nonfocal, appropriate affect, alert and oriented x3; strength is 5 over 5 upper limbs, 5 minus over 5 lower limbs, including dorsiflexion. Skin: Benign with no rash  LAB RESULTS: Lab Results  Component Value Date   WBC 10.8* 07/21/2012   NEUTROABS 9.7* 07/21/2012   HGB 10.0* 07/21/2012   HCT 29.4* 07/21/2012   MCV 97.1 07/21/2012   PLT 94* 07/21/2012      Chemistry      Component Value Date/Time   NA 138 07/21/2012 1020   K 4.4 07/21/2012 1020   CL 106 07/21/2012 1020   CO2 19 07/21/2012 1020   BUN 44* 07/21/2012 1020   CREATININE  1.63* 07/21/2012 1020      Component Value Date/Time   CALCIUM 8.9 07/21/2012 1020   ALKPHOS 49 07/21/2012 1020   AST 22 07/21/2012 1020   ALT 17 07/21/2012 1020   BILITOT 0.3 07/21/2012 1020       Lipid Panel     Component Value Date/Time   CHOL 209* 07/14/2012 1036   TRIG 606* 07/14/2012 1036   HDL 27* 07/14/2012 1036   CHOLHDL 7.7 07/14/2012 1036   VLDL NOT CALC 07/14/2012 1036   LDLCALC ** 07/14/2012 1036   Tacrolimus Level  Was 3.9 on July 31 (trough level).  Magnesium was 1.1 on July 31 CMV DNA on 07/14/2012:  <200  On 07/14/2012:  IgG = 743;   IgA = 7;   IgM = <5  STUDIES: Review of outside studies obtained in [place] included a CT of the head and sinuses with and without contrast 05/24/2012 showing normal brain and clear paranasal sinuses CT of the neck on 05/21/2012 showed no cervical lymphadenopathy or mass with unremarkable submandibular and parotid glands. The thyroid was normal. CT of the chest on the same day showed significant decrease in the large lymph nodes previously noted, with a left internal mammary lymph node measuring 9 mm and a left axillary lymph node at 10 mm. Largest pretracheal lymph node measured 10 mm. Small hypoattenuating liver lesions were unchanged as compared to prior and his spleen size was stable as well. Bone density may 20 02/11/2012 showed normal T score is brain MRI 03/07/2012 was normal  No new results found.   ASSESSMENT: 66 y.o. [place] man with a history of well-differentiated lymphocytic lymphoma/ chronic lymphoid leukemia initially diagnosed in 2000, not requiring intervention until 2006; with multiple chromosomal abnormalities including 17 (not at the site of p53 mutation).  His treatment history is as follows  (1) fludarabine/cyclophosphamide/rituximab x5 completed May 2007.  (2) rituximab for 8 doses October 2010, with partial response  (3) Leustatin and ofatumumab weekly x8 July to September 2011 followed by maintenance ofatumumab  maintenance ofatumumab every 2 months, with initial response but rising counts September 2012  (4) status-post unrelated donor stem-cell transplant 02/24/2012 at the [facility]     (a) conditioning regimen consisted of fludarabine + TBI at 200 cGy, followed by rituximab x27     (b) CMV reactivation x2 (patient CMV positive, donor negative), s/p ganciclovir treatment     (c) GVHD: involving gut and skin, treated with steroids, tacrolimus and MMF     (d) atrial fibrillation: resolved on amiodarone     (e) steroid-induced myopathy: improving     (f) hypomagnesemia     (g) hypogammaglobulinemia: s/p IVIG 07/09/2012     (h) elevated triglycerides (606 on 07/14/2012)   PLAN: ongoing issues include  (1) chronic GVHD prophylaxis: on steroid taper, MMF (CellCept), tacrolimus (Prograf), ursodiol and budesonide--- check tacrolimus levels weekly until stable, then broaden to monthly  (2) CMV reactivation: PCR monthly through October, then reassess  (3) hypogammaglobulinemia: received IgG at 400 mg/kg 07/09/2012; follow levels monthly  (4) immunocompromise: continue prophylaxis with zovirax, diflucan, levofloxacin and Bactrim  (5)  CGVHD assessment weekly initially, then at broadening intervals, with CBC, CMET, maggnesium weekly, IgG, CMV PCR,  and fasting lipids monthly.  Today we renewed all  his medications and provided  his wife [name] with all the material for her to continue to care for his Hickman catheter at home. He will receive intravenous magnesium today. We are referring him to Dr. Reva Bores for further evaluation of his oral graft-versus-host disease, which is grade 1 at this point. Otherwise we completed the chronic graft-versus-host disease assessment form, which is separately scanned. He will return to see [name] August 9, with repeat labs 2 days before. He knows to call for any other problems that may develop before that visit.   Isabellamarie Randa C    07/23/2012

## 2012-07-23 NOTE — Progress Notes (Signed)
I spoke to wife who was in the infusion room with pt . She said that she spoke to Dr Darnelle Catalan this am and told him Roni had some 1 mg tablets but he needed more 0.5 mg tablets, #60  so he could take 1.5 mg po bid as he was instructed per Dr Darnelle Catalan. I called this into the pharmacy .

## 2012-07-23 NOTE — Progress Notes (Signed)
Communicated with desk nurse concerning PO Magnesium tablet.

## 2012-07-23 NOTE — Telephone Encounter (Signed)
Per patient's request, called [facility] for refill of Beclomethasone Dipropionate.

## 2012-07-23 NOTE — Telephone Encounter (Signed)
What is proper dose and amount fo tacrolimus. Changed from 1 mg to 0.5 mg but still says "5 mg bid , and only #60-"call [facility] [phone]

## 2012-07-23 NOTE — Patient Instructions (Signed)
[facility] Discharge Instructions for Patients Receiving Chemotherapy  Today you received the following Magnesium Sulfate and Normal Saline. If you develop nausea and vomiting that is not controlled by your nausea medication, call the clinic. If it is after clinic hours your family physician or the after hours number for the clinic or go to the Emergency Department.   BELOW ARE SYMPTOMS THAT SHOULD BE REPORTED IMMEDIATELY:  *FEVER GREATER THAN 100.5 F  *CHILLS WITH OR WITHOUT FEVER  NAUSEA AND VOMITING THAT IS NOT CONTROLLED WITH YOUR NAUSEA MEDICATION  *UNUSUAL SHORTNESS OF BREATH  *UNUSUAL BRUISING OR BLEEDING  TENDERNESS IN MOUTH AND THROAT WITH OR WITHOUT PRESENCE OF ULCERS  *URINARY PROBLEMS  *BOWEL PROBLEMS  UNUSUAL RASH Items with * indicate a potential emergency and should be followed up as soon as possible.  One of the nurses will contact you 24 hours after your treatment. Please let the nurse know about any problems that you may have experienced. Feel free to call the clinic you have any questions or concerns. The clinic phone number is [phone].   I have been informed and understand all the instructions given to me. I know to contact the clinic, my physician, or go to the Emergency Department if any problems should occur. I do not have any questions at this time, but understand that I may call the clinic during office hours   should I have any questions or need assistance in obtaining follow up care.    __________________________________________  _____________  __________ Signature of Patient or Authorized Representative            Date                   Time    __________________________________________ Nurse's Signature    

## 2012-07-23 NOTE — Telephone Encounter (Signed)
Long term follow up at [facility] number is [phone]; fax number is [phone].  Dr Remus Blake fax number is [phone]

## 2012-07-26 ENCOUNTER — Telehealth: Payer: Self-pay | Admitting: Oncology

## 2012-07-26 ENCOUNTER — Encounter: Payer: Self-pay | Admitting: Physician Assistant

## 2012-07-26 ENCOUNTER — Ambulatory Visit (HOSPITAL_BASED_OUTPATIENT_CLINIC_OR_DEPARTMENT_OTHER): Payer: BC Managed Care – PPO

## 2012-07-26 ENCOUNTER — Ambulatory Visit: Payer: BC Managed Care – PPO

## 2012-07-26 ENCOUNTER — Ambulatory Visit (HOSPITAL_BASED_OUTPATIENT_CLINIC_OR_DEPARTMENT_OTHER): Payer: BC Managed Care – PPO | Admitting: Physician Assistant

## 2012-07-26 ENCOUNTER — Encounter: Payer: Self-pay | Admitting: *Deleted

## 2012-07-26 VITALS — BP 109/64 | HR 87 | Temp 98.1°F | Resp 20 | Ht 67.5 in | Wt 208.1 lb

## 2012-07-26 DIAGNOSIS — R5381 Other malaise: Secondary | ICD-10-CM

## 2012-07-26 DIAGNOSIS — R42 Dizziness and giddiness: Secondary | ICD-10-CM

## 2012-07-26 DIAGNOSIS — Z9489 Other transplanted organ and tissue status: Secondary | ICD-10-CM

## 2012-07-26 DIAGNOSIS — C9110 Chronic lymphocytic leukemia of B-cell type not having achieved remission: Secondary | ICD-10-CM

## 2012-07-26 DIAGNOSIS — D89811 Chronic graft-versus-host disease: Secondary | ICD-10-CM

## 2012-07-26 DIAGNOSIS — R5383 Other fatigue: Secondary | ICD-10-CM

## 2012-07-26 DIAGNOSIS — D89813 Graft-versus-host disease, unspecified: Secondary | ICD-10-CM

## 2012-07-26 LAB — CBC WITH DIFFERENTIAL/PLATELET
EOS%: 0.3 % (ref 0.0–7.0)
MCH: 32.8 pg (ref 27.2–33.4)
MCV: 96.1 fL (ref 79.3–98.0)
MONO%: 8.4 % (ref 0.0–14.0)
NEUT#: 9 10*3/uL — ABNORMAL HIGH (ref 1.5–6.5)
RBC: 3.08 10*6/uL — ABNORMAL LOW (ref 4.20–5.82)
RDW: 17.6 % — ABNORMAL HIGH (ref 11.0–14.6)

## 2012-07-26 LAB — COMPREHENSIVE METABOLIC PANEL
AST: 26 U/L (ref 0–37)
Albumin: 3.3 g/dL — ABNORMAL LOW (ref 3.5–5.2)
Alkaline Phosphatase: 66 U/L (ref 39–117)
Potassium: 4.1 mEq/L (ref 3.5–5.3)
Sodium: 135 mEq/L (ref 135–145)
Total Protein: 5.6 g/dL — ABNORMAL LOW (ref 6.0–8.3)

## 2012-07-26 MED ORDER — MAGNESIUM SULFATE 50 % IJ SOLN
Freq: Once | INTRAMUSCULAR | Status: AC
  Administered 2012-07-26: 13:00:00 via INTRAVENOUS
  Filled 2012-07-26: qty 1000

## 2012-07-26 NOTE — Progress Notes (Signed)
ID: Timothy [name]   DOB: August 31, 1946  MR#: [id number]  WUJ#:[id number]  HISTORY OF PRESENT ILLNESS: We have very complete records from Dr. Sydnee Levans in [place], and in summary:  The patient was initially diagnosed in August 2000, with a white cell count of 23,600, but normal hemoglobin and platelets, and no significant symptomatology. Over the next several years his white cell count drifted up, and he eventually developed some symptoms of night sweats in particular, leading to treatment with fludarabine, Cytoxan and rituxan for five cycles given between December 2006 and May 2007.  We have CT scans from June 2006, November 2006 and April 2007, and comparing the November 2006 and April 2007 scans, there was near complete response. He had subsequent therapy in [place] as detailed below, but with decreased response, leading to allogeneic stem-cell transplant at the [facility] 02/24/2012.  INTERVAL HISTORY: Timothy [name] returns with his wife [name] for followup in between scheduled followup visits. Recall that he is status post allogenic stem cell transplant in [place] in March 2013, and we continue to see him weekly. He has dropped by the office today with some complaints of weakness, fatigue, and some mild dizziness. He fell over the weekend, and although he denies any injuries, he is being very cautious with ambulation. He denies any pain. He's had no abnormal bleeding, and denies any fevers or chills.  REVIEW OF SYSTEMS: Tear he continues to have some mild mucositis.  This is probably causing a decrease in his intake, especially with fluids. He denies any significant nausea and is having regular bowel movements. No dysuria or hematuria. He denies any cough phlegm production or shortness of breath. No hemoptysis. No chest pain or pressure. No peripheral swelling. He continues to have a tremor in the upper extremities which is stable.  A detailed review of systems is otherwise stable and  noncontributory.   PAST MEDICAL HISTORY: Past Medical History  Diagnosis Date  . 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 Diverticular disease   . Hyperlipidemia   . Obesity   . Hypertension   . Hiatal hernia   . CMV (cytomegalovirus) antibody positive     pre-transplant, with seroconversion x2 pst-transplant  . Right bundle branch block     pre-transplant  . CKD (chronic kidney disease) stage 2, GFR 60-89 ml/min   . Pancytopenia   . Steroid-induced diabetes   . Atrial fibrillation     post-transplant  . Myopathy   . Fine tremor     likely secondary to tacrolimus    PAST SURGICAL HISTORY: Past Surgical History  Procedure Date  . Tonsillectomy and adenoidectomy     FAMILY HISTORY Family History  Problem Relation Age of Onset  . Cancer Father    The patient's father died from complications of chronic lymphocytic leukemia at the age of 77.  It had been diagnosed seven years before when he was 21.  The patient is enrolled in a familial chronic lymphocytic leukemia study out of the [facility].  The patient's mother is 69, alive, unfortunately suffering with dementia, and he has a brother, 62, who is otherwise in fair health.   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  His wife of >40 years, [name], is a homemaker.  Their daughter, [name], lives in [place].  She also is a Futures trader.  The patient has  an 5 year old grandson and an 44-year-old granddaughter, and that is really the main reason is moved to this area.  He is a International aid/development worker.     ADVANCED DIRECTIVES:  HEALTH MAINTENANCE: History  Substance Use Topics  . Smoking status: Never Smoker   . Smokeless tobacco: Never Used  . Alcohol Use:      Colonoscopy:  PAP:  Bone density:  Lipid panel:  No Known Allergies  Current Outpatient  Prescriptions  Medication Sig Dispense Refill  . acyclovir (ZOVIRAX) 800 MG tablet       . Alum & Mag Hydroxide-Simeth (MAGIC MOUTHWASH W/LIDOCAINE) SOLN Take 5 mLs by mouth 4 (four) times daily as needed.      Marland Kitchen amLODipine (NORVASC) 5 MG tablet Take 5 mg by mouth daily.        Marland Kitchen atorvastatin (LIPITOR) 10 MG tablet Take 10 mg by mouth daily.        . Beclomethasone Dipropionate POWD In corn oil emulsion to equal 1mg  per ml  On tapered dose Take with food  200 g  2  . bisoprolol-hydrochlorothiazide (ZIAC) 10-6.25 MG per tablet       . budesonide (ENTOCORT EC) 3 MG 24 hr capsule Take 1 capsule (3 mg total) by mouth every morning.  60 capsule  12  . Calcium Citrate (CITRACAL PO) Take 1,200 mg by mouth 2 (two) times daily.      . cholecalciferol (VITAMIN D) 1000 UNITS tablet Take 1,000 Units by mouth daily.      Marland Kitchen dexamethasone 0.5 MG/5ML elixir Take by mouth as directed. Rinse with 5-10 mL for 4 to 5 minutes 4-6 times daily. Use as a swish and spit rinse.      . fluconazole (DIFLUCAN) 200 MG tablet Take 1 tablet (200 mg total) by mouth daily.  30 tablet  6  . labetalol (NORMODYNE) 200 MG tablet Take 1 tablet (200 mg total) by mouth 2 (two) times daily.  60 tablet  6  . levofloxacin (LEVAQUIN) 750 MG tablet Take 1 tablet (750 mg total) by mouth daily.  30 tablet  12  . lisinopril (PRINIVIL,ZESTRIL) 10 MG tablet Take 1 tablet (10 mg total) by mouth daily.  90 tablet  11  . LORazepam (ATIVAN) 0.5 MG tablet Take 1 tablet (0.5 mg total) by mouth every 6 (six) hours as needed for anxiety.  120 tablet  2  . Multiple Vitamin (MULTIVITAMIN) tablet Take 1 tablet by mouth daily.      . mycophenolate (CELLCEPT) 250 MG capsule       . omega-3 acid ethyl esters (LOVAZA) 1 G capsule Take 1 g by mouth 2 (two) times daily.      . predniSONE (DELTASONE) 20 MG tablet       . PRILOSEC OTC 20 MG tablet       . prochlorperazine (COMPAZINE) 10 MG tablet       . sertraline (ZOLOFT) 50 MG tablet       . SF 1.1 % GEL  dental gel       . sulfamethoxazole-trimethoprim (BACTRIM DS) 800-160 MG per tablet       . tacrolimus (PROGRAF) 1 MG capsule Take 5 capsules (5 mg total) by mouth 2 (two) times daily.  60 capsule  3  . trandolapril (MAVIK) 4 MG tablet Take 4 mg by mouth daily.        . ursodiol (ACTIGALL) 300 MG capsule Take 2 capsules (600 mg total) by mouth 2 (two) times daily.  360 capsule  12   Current Facility-Administered Medications  Medication Dose Route Frequency Provider Last Rate Last Dose  . sodium chloride 0.9 % 1,000 mL with magnesium sulfate 1 g infusion   Intravenous Once Catalina Gravel, PA        OBJECTIVE: Middle-aged white male who appears weak but in in no acute distress Filed Vitals:   07/26/12 1213  BP: 109/64  Pulse: 87  Temp: 98.1 F (36.7 C)  Resp: 20     Body mass index is 32.11 kg/(m^2).    ECOG FS: 2 Sclerae anicteric.Marland Kitchen Oropharynx shows no thrush, no ulcers, no lesions, no erythema.  No cervical or supraclavicular adenopathy Lungs  clear to auscultation bilaterally, good excursion bilaterally Heart regular rate and rhythm, no murmur appreciated Abd soft, nontender, positive bowel sounds And 8 while it will and will MSK no focal spinal tenderness, minimal bilateral ankle edema,  No significant limits in ROM. Neuro: nonfocal, appropriate affect, alert and oriented x3; Skin: Benign with no rash  LAB RESULTS: Lab Results  Component Value Date   WBC 11.1* 07/26/2012   NEUTROABS 9.0* 07/26/2012   HGB 10.1* 07/26/2012   HCT 29.6* 07/26/2012   MCV 96.1 07/26/2012   PLT 113* 07/26/2012      Chemistry      Component Value Date/Time   NA 135 07/26/2012 1231   K 4.1 07/26/2012 1231   CL 103 07/26/2012 1231   CO2 21 07/26/2012 1231   BUN 40* 07/26/2012 1231   CREATININE 1.21 07/26/2012 1231      Component Value Date/Time   CALCIUM 8.9 07/26/2012 1231   ALKPHOS 66 07/26/2012 1231   AST 26 07/26/2012 1231   ALT 21 07/26/2012 1231   BILITOT 0.2* 07/26/2012 1231     A magnesium level was drawn today,  with results pending.  Lipid Panel     Component Value Date/Time   CHOL 209* 07/14/2012 1036   TRIG 606* 07/14/2012 1036   HDL 27* 07/14/2012 1036   CHOLHDL 7.7 07/14/2012 1036   VLDL NOT CALC 07/14/2012 1036   LDLCALC ** 07/14/2012 1036   Tacrolimus Level  Was 3.9 on July 31 (trough level).  Magnesium was 1.1 on July 31 CMV DNA on 07/14/2012:  <200  On 07/14/2012:  IgG = 743;   IgA = 7;   IgM = <5  STUDIES: Review of outside studies obtained in [place] included a CT of the head and sinuses with and without contrast 05/24/2012 showing normal brain and clear paranasal sinuses CT of the neck on 05/21/2012 showed no cervical lymphadenopathy or mass with unremarkable submandibular and parotid glands. The thyroid was normal. CT of the chest on the same day showed significant decrease in the large lymph nodes previously noted, with a left internal mammary lymph node measuring 9 mm and a left axillary lymph node at 10 mm. Largest pretracheal lymph node measured 10 mm. Small hypoattenuating liver lesions were unchanged as compared to prior and his spleen size was stable as well. Bone density may 20 02/11/2012 showed normal T score is brain MRI 03/07/2012 was normal  No new results found.   ASSESSMENT: 66 y.o. [place] man with a history of well-differentiated lymphocytic lymphoma/ chronic lymphoid leukemia initially diagnosed in 2000, not requiring intervention until 2006; with multiple chromosomal abnormalities including 17 (not at the site of p53 mutation).  His treatment history is as follows  (1) fludarabine/cyclophosphamide/rituximab x5 completed May 2007.  (2) rituximab for 8 doses October 2010, with partial response  (3)  Leustatin and ofatumumab weekly x8 July to September 2011 followed by maintenance ofatumumab maintenance ofatumumab every 2 months, with initial response but rising counts September 2012  (4) status-post unrelated donor stem-cell transplant 02/24/2012 at the [facility]      (a) conditioning regimen consisted of fludarabine + TBI at 200 cGy, followed by rituximab x27     (b) CMV reactivation x2 (patient CMV positive, donor negative), s/p ganciclovir treatment     (c) GVHD: involving gut and skin, treated with steroids, tacrolimus and MMF     (d) atrial fibrillation: resolved on amiodarone     (e) steroid-induced myopathy: improving     (f) hypomagnesemia     (g) hypogammaglobulinemia: s/p IVIG 07/09/2012     (h) elevated triglycerides (606 on 07/14/2012)   PLAN: ongoing issues include  (1) chronic GVHD prophylaxis: on steroid taper, MMF (CellCept), tacrolimus (Prograf), ursodiol and budesonide--- check tacrolimus levels weekly until stable, then broaden to monthly  (2) CMV reactivation: PCR monthly through October, then reassess  (3) hypogammaglobulinemia: received IgG at 400 mg/kg 07/09/2012; follow levels monthly  (4) immunocompromise: continue prophylaxis with zovirax, diflucan, levofloxacin and Bactrim  (5)  CGVHD assessment weekly initially, then at broadening intervals, with CBC, CMET, maggnesium weekly, IgG, CMV PCR,  and fasting lipids monthly.  This case was reviewed with Dr. Welton Flakes today in Dr. Darrall Dears absence. Martino will proceed for supportive IV fluids, along with 1 g of IV magnesium sulfate today. We will plan on doing the same on Wednesday and Friday of this week, and we'll check his labs with each visit. He is scheduled to see me again on Friday, August 9, but he and [name] both know to call with any continuing issues.   Dr. Darnelle Catalan has referred him to Dr. Reva Bores for further evaluation of his oral graft-versus-host disease, which is grade 1 at this point.  that appointment has not yet been made.   Naveen Clardy    07/26/2012

## 2012-07-26 NOTE — Progress Notes (Unsigned)
PER AB, STAT LABS HAVE BEEN DRAWN FROM PT CENTRAL LINE. FLUSHED PER PROTOCOL

## 2012-07-26 NOTE — Patient Instructions (Addendum)
[facility] Discharge Instructions for Patients Receiving Chemotherapy  Today you received the following chemotherapy agents Magnesium Sulfate and 0.9% Normal Saline  To help prevent nausea and vomiting after your treatment, we encourage you to take your nausea medication Begin taking it at 7 pm and take it as often as prescribed for the next 24 to 72 hours.   If you develop nausea and vomiting that is not controlled by your nausea medication, call the clinic. If it is after clinic hours your family physician or the after hours number for the clinic or go to the Emergency Department.   BELOW ARE SYMPTOMS THAT SHOULD BE REPORTED IMMEDIATELY:  *FEVER GREATER THAN 100.5 F  *CHILLS WITH OR WITHOUT FEVER  NAUSEA AND VOMITING THAT IS NOT CONTROLLED WITH YOUR NAUSEA MEDICATION  *UNUSUAL SHORTNESS OF BREATH  *UNUSUAL BRUISING OR BLEEDING  TENDERNESS IN MOUTH AND THROAT WITH OR WITHOUT PRESENCE OF ULCERS  *URINARY PROBLEMS  *BOWEL PROBLEMS  UNUSUAL RASH Items with * indicate a potential emergency and should be followed up as soon as possible.  One of the nurses will contact you 24 hours after your treatment. Please let the nurse know about any problems that you may have experienced. Feel free to call the clinic you have any questions or concerns. The clinic phone number is [phone].   I have been informed and understand all the instructions given to me. I know to contact the clinic, my physician, or go to the Emergency Department if any problems should occur. I do not have any questions at this time, but understand that I may call the clinic during office hours   should I have any questions or need assistance in obtaining follow up care.    __________________________________________  _____________  __________ Signature of Patient or Authorized Representative            Date                   Time    __________________________________________ Nurse's  Signature

## 2012-07-26 NOTE — Telephone Encounter (Signed)
gv pt appt schedule for August.  °

## 2012-07-27 ENCOUNTER — Other Ambulatory Visit: Payer: Self-pay | Admitting: Physician Assistant

## 2012-07-27 DIAGNOSIS — D89811 Chronic graft-versus-host disease: Secondary | ICD-10-CM

## 2012-07-27 DIAGNOSIS — Z9489 Other transplanted organ and tissue status: Secondary | ICD-10-CM

## 2012-07-27 DIAGNOSIS — C9110 Chronic lymphocytic leukemia of B-cell type not having achieved remission: Secondary | ICD-10-CM

## 2012-07-28 ENCOUNTER — Other Ambulatory Visit (HOSPITAL_BASED_OUTPATIENT_CLINIC_OR_DEPARTMENT_OTHER): Payer: BC Managed Care – PPO

## 2012-07-28 ENCOUNTER — Ambulatory Visit (HOSPITAL_BASED_OUTPATIENT_CLINIC_OR_DEPARTMENT_OTHER): Payer: BC Managed Care – PPO

## 2012-07-28 DIAGNOSIS — C9110 Chronic lymphocytic leukemia of B-cell type not having achieved remission: Secondary | ICD-10-CM

## 2012-07-28 DIAGNOSIS — D89811 Chronic graft-versus-host disease: Secondary | ICD-10-CM

## 2012-07-28 DIAGNOSIS — Z9489 Other transplanted organ and tissue status: Secondary | ICD-10-CM

## 2012-07-28 LAB — COMPREHENSIVE METABOLIC PANEL
ALT: 18 U/L (ref 0–53)
AST: 27 U/L (ref 0–37)
CO2: 20 mEq/L (ref 19–32)
Calcium: 9.2 mg/dL (ref 8.4–10.5)
Chloride: 104 mEq/L (ref 96–112)
Creatinine, Ser: 1.29 mg/dL (ref 0.50–1.35)
Sodium: 136 mEq/L (ref 135–145)
Total Bilirubin: 0.2 mg/dL — ABNORMAL LOW (ref 0.3–1.2)
Total Protein: 5.8 g/dL — ABNORMAL LOW (ref 6.0–8.3)

## 2012-07-28 LAB — CBC WITH DIFFERENTIAL/PLATELET
BASO%: 0.3 % (ref 0.0–2.0)
Eosinophils Absolute: 0 10*3/uL (ref 0.0–0.5)
MCHC: 34.6 g/dL (ref 32.0–36.0)
MONO#: 0.8 10*3/uL (ref 0.1–0.9)
NEUT#: 9.8 10*3/uL — ABNORMAL HIGH (ref 1.5–6.5)
RBC: 3.03 10*6/uL — ABNORMAL LOW (ref 4.20–5.82)
RDW: 17 % — ABNORMAL HIGH (ref 11.0–14.6)
WBC: 12.3 10*3/uL — ABNORMAL HIGH (ref 4.0–10.3)
nRBC: 0 % (ref 0–0)

## 2012-07-28 LAB — MAGNESIUM: Magnesium: 1.4 mg/dL — ABNORMAL LOW (ref 1.5–2.5)

## 2012-07-28 MED ORDER — SODIUM CHLORIDE 0.9 % IV SOLN
Freq: Once | INTRAVENOUS | Status: AC
  Administered 2012-07-28: 16:00:00 via INTRAVENOUS
  Filled 2012-07-28: qty 1000

## 2012-07-29 LAB — IGG, IGA, IGM
IgA: 6 mg/dL — ABNORMAL LOW (ref 68–379)
IgM, Serum: <5 mg/dL — ABNORMAL LOW (ref 41–251)

## 2012-07-30 ENCOUNTER — Ambulatory Visit: Payer: BC Managed Care – PPO | Admitting: Lab

## 2012-07-30 ENCOUNTER — Telehealth: Payer: Self-pay | Admitting: *Deleted

## 2012-07-30 ENCOUNTER — Ambulatory Visit (HOSPITAL_BASED_OUTPATIENT_CLINIC_OR_DEPARTMENT_OTHER): Payer: BC Managed Care – PPO

## 2012-07-30 ENCOUNTER — Telehealth: Payer: Self-pay | Admitting: Oncology

## 2012-07-30 ENCOUNTER — Encounter: Payer: Self-pay | Admitting: Physician Assistant

## 2012-07-30 ENCOUNTER — Ambulatory Visit (HOSPITAL_BASED_OUTPATIENT_CLINIC_OR_DEPARTMENT_OTHER): Payer: BC Managed Care – PPO | Admitting: Physician Assistant

## 2012-07-30 VITALS — BP 103/64 | HR 84 | Temp 98.2°F | Resp 20 | Ht 67.5 in | Wt 210.0 lb

## 2012-07-30 DIAGNOSIS — C9110 Chronic lymphocytic leukemia of B-cell type not having achieved remission: Secondary | ICD-10-CM

## 2012-07-30 DIAGNOSIS — Z9489 Other transplanted organ and tissue status: Secondary | ICD-10-CM

## 2012-07-30 DIAGNOSIS — D89811 Chronic graft-versus-host disease: Secondary | ICD-10-CM

## 2012-07-30 DIAGNOSIS — D801 Nonfamilial hypogammaglobulinemia: Secondary | ICD-10-CM

## 2012-07-30 LAB — CBC WITH DIFFERENTIAL/PLATELET
Basophils Absolute: 0 10*3/uL (ref 0.0–0.1)
EOS%: 0.3 % (ref 0.0–7.0)
HGB: 9.9 g/dL — ABNORMAL LOW (ref 13.0–17.1)
MCH: 33.1 pg (ref 27.2–33.4)
NEUT#: 8.9 10*3/uL — ABNORMAL HIGH (ref 1.5–6.5)
RDW: 18 % — ABNORMAL HIGH (ref 11.0–14.6)
WBC: 10.7 10*3/uL — ABNORMAL HIGH (ref 4.0–10.3)
lymph#: 1 10*3/uL (ref 0.9–3.3)

## 2012-07-30 LAB — MAGNESIUM: Magnesium: 1.4 mg/dL — ABNORMAL LOW (ref 1.5–2.5)

## 2012-07-30 MED ORDER — DEXAMETHASONE 0.5 MG/5ML PO ELIX: 1.0000 mg | ORAL_SOLUTION | Freq: Four times a day (QID) | ORAL | Status: DC

## 2012-07-30 MED ORDER — SODIUM CHLORIDE 0.9 % IV SOLN
INTRAVENOUS | Status: AC
  Administered 2012-07-30: 11:00:00 via INTRAVENOUS
  Filled 2012-07-30: qty 1000

## 2012-07-30 NOTE — Progress Notes (Signed)
ID: Timothy [name]   DOB: Aug 03, 1946  MR#: [id number]  WUJ#:[id number]  HISTORY OF PRESENT ILLNESS: We have very complete records from Dr. Sydnee [name] in [place], and in summary:  The patient was initially diagnosed in August 2000, with a white cell count of 23,600, but normal hemoglobin and platelets, and no significant symptomatology. Over the next several years his white cell count drifted up, and he eventually developed some symptoms of night sweats in particular, leading to treatment with fludarabine, Cytoxan and rituxan for five cycles given between December 2006 and May 2007.  We have CT scans from June 2006, November 2006 and April 2007, and comparing the November 2006 and April 2007 scans, there was near complete response. He had subsequent therapy in [place] as detailed below, but with decreased response, leading to allogeneic stem-cell transplant at the [facility] 02/24/2012.  INTERVAL HISTORY: Timothy [name] returns with his wife Timothy [name] for followup, status post allogenic stem cell transplant in March 2013. We are seeing him weekly, with labwork every Wednesday.  He has also been receiving supportive IV fluids with IV magnesium this week, Monday, Wednesday, and again today. He is feeling much better since he was seeing her on Monday, August 5. Timothy [name] agrees and thinks he has "better color" today.   REVIEW OF SYSTEMS: Overall he  continues to do remarkably well. He is moderately fatigued, but continues to work at increasing his functional status. He has difficulty walking up stairs.He denies any falls this week. He has some discomfort orally. This is slightly improved.  There is no pain, no odynophagia, no difficulty swallowing, but it "doesn't feel right".  He continues to utilize a dexamethasone elixir several times daily, which she needs a refill today.  There has been no fever, no rash, and no bleeding  Bowel movements are normal and bladder function is likewise normal. There  has been no cough, phlegm production, pleurisy, or hemoptysis.He continues to have shortness of breath with exertion which is stable.  He continues to have a fine tremor, but actually feels that this has improved slightly.  . A detailed review of systems today was otherwise noncontributory.  PAST MEDICAL HISTORY: Past Medical History  Diagnosis Date  . 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 Diverticular disease   . Hyperlipidemia   . Obesity   . Hypertension   . Hiatal hernia   . CMV (cytomegalovirus) antibody positive     pre-transplant, with seroconversion x2 pst-transplant  . Right bundle branch block     pre-transplant  . CKD (chronic kidney disease) stage 2, GFR 60-89 ml/min   . Pancytopenia   . Steroid-induced diabetes   . Atrial fibrillation     post-transplant  . Myopathy   . Fine tremor     likely secondary to tacrolimus    PAST SURGICAL HISTORY: Past Surgical History  Procedure Date  . Tonsillectomy and adenoidectomy     FAMILY HISTORY Family History  Problem Relation Age of Onset  . Cancer Father    The patient's father died from complications of chronic lymphocytic leukemia at the age of 55.  It had been diagnosed seven years before when he was 36.  The patient is enrolled in a familial chronic lymphocytic leukemia study out of the [employer] [facility] [employer].  The patient's mother is 67, alive, unfortunately suffering with dementia, and he has a brother, 20, who is otherwise in fair health.   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  His wife of >40 years, Timothy [name], is a homemaker.  Their daughter, Timothy [name], lives in [place].  She also is a Futures trader.  The patient has an 43 year old grandson and an 34-year-old granddaughter, and that is really the main reason is moved to this area.  He is a International aid/development worker.        ADVANCED DIRECTIVES:  HEALTH MAINTENANCE: History  Substance Use Topics  . Smoking status: Never Smoker   . Smokeless tobacco: Never Used  . Alcohol Use:      Colonoscopy:  PAP:  Bone density:  Lipid panel:  No Known Allergies  Current Outpatient Prescriptions  Medication Sig Dispense Refill  . acyclovir (ZOVIRAX) 800 MG tablet       . Alum & Mag Hydroxide-Simeth (MAGIC MOUTHWASH W/LIDOCAINE) SOLN Take 5 mLs by mouth 4 (four) times daily as needed.      Marland Kitchen amLODipine (NORVASC) 5 MG tablet Take 5 mg by mouth daily.        Marland Kitchen atorvastatin (LIPITOR) 10 MG tablet Take 10 mg by mouth daily.        . Beclomethasone Dipropionate POWD In corn oil emulsion to equal 1mg  per ml  On tapered dose Take with food  200 g  2  . bisoprolol-hydrochlorothiazide (ZIAC) 10-6.25 MG per tablet       . budesonide (ENTOCORT EC) 3 MG 24 hr capsule Take 1 capsule (3 mg total) by mouth every morning.  60 capsule  12  . Calcium Citrate (CITRACAL PO) Take 1,200 mg by mouth 2 (two) times daily.      . cholecalciferol (VITAMIN D) 1000 UNITS tablet Take 1,000 Units by mouth daily.      Marland Kitchen dexamethasone 0.5 MG/5ML elixir Take 10 mLs (1 mg total) by mouth 4 (four) times daily. Rinse with 5-10 mL for 4 to 5 minutes 4-6 times daily. Use as a swish and spit rinse.  480 mL  0  . fluconazole (DIFLUCAN) 200 MG tablet Take 1 tablet (200 mg total) by mouth daily.  30 tablet  6  . labetalol (NORMODYNE) 200 MG tablet Take 1 tablet (200 mg total) by mouth 2 (two) times daily.  60 tablet  6  . levofloxacin (LEVAQUIN) 750 MG tablet Take 1 tablet (750 mg total) by mouth daily.  30 tablet  12  . lisinopril (PRINIVIL,ZESTRIL) 10 MG tablet Take 1 tablet (10 mg total) by mouth daily.  90 tablet  11  . LORazepam (ATIVAN) 0.5 MG tablet Take 1 tablet (0.5 mg total) by mouth every 6 (six) hours as needed for anxiety.  120 tablet  2  . Multiple Vitamin (MULTIVITAMIN) tablet Take 1 tablet by mouth daily.      . mycophenolate (CELLCEPT)  250 MG capsule       . omega-3 acid ethyl esters (LOVAZA) 1 G capsule Take 1 g by mouth 2 (two) times daily.      . predniSONE (DELTASONE) 20 MG tablet       . PRILOSEC OTC 20 MG tablet       . prochlorperazine (COMPAZINE) 10 MG tablet       . sertraline (ZOLOFT) 50 MG tablet       . SF 1.1 % GEL dental gel       . sulfamethoxazole-trimethoprim (BACTRIM DS) 800-160 MG per tablet       . tacrolimus (PROGRAF) 1 MG capsule Take 5  capsules (5 mg total) by mouth 2 (two) times daily.  60 capsule  3  . trandolapril (MAVIK) 4 MG tablet Take 4 mg by mouth daily.        . ursodiol (ACTIGALL) 300 MG capsule Take 2 capsules (600 mg total) by mouth 2 (two) times daily.  360 capsule  12    OBJECTIVE: Middle-aged white male in no acute distress Filed Vitals:   07/30/12 0938  BP: 103/64  Pulse: 84  Temp: 98.2 F (36.8 C)  Resp: 20     Body mass index is 32.41 kg/(m^2).    ECOG FS: 2 Sclerae anicteric.Marland Kitchen Oropharynx shows no thrush, no ulcers, no lesions, no erythema.  No cervical or supraclavicular adenopathy Lungs  clear to auscultation bilaterally, good excursion bilaterally Heart regular rate and rhythm, no murmur appreciated Abd soft, nontender, positive bowel sounds, no organomegaly MSK no focal spinal tenderness, minimal nonpitting pedal edema, equal bilaterally,  No significant limits in ROM. Neuro: nonfocal, appropriate affect, alert and oriented x3; strength is 5 over 5 upper limbs, 5 minus over 5 lower limbs, including dorsiflexion. Skin: Benign with no rash  LAB RESULTS: Lab Results  Component Value Date   WBC 12.3* 07/28/2012   NEUTROABS 9.8* 07/28/2012   HGB 9.7* 07/28/2012   HCT 28.0* 07/28/2012   MCV 92.4 07/28/2012   PLT 114* 07/28/2012      Chemistry      Component Value Date/Time   NA 136 07/28/2012 1522   K 4.6 07/28/2012 1522   CL 104 07/28/2012 1522   CO2 20 07/28/2012 1522   BUN 40* 07/28/2012 1522   CREATININE 1.29 07/28/2012 1522      Component Value Date/Time   CALCIUM 9.2  07/28/2012 1522   ALKPHOS 58 07/28/2012 1522   AST 27 07/28/2012 1522   ALT 18 07/28/2012 1522   BILITOT 0.2* 07/28/2012 1522       Lipid Panel     Component Value Date/Time   CHOL 209* 07/14/2012 1036   TRIG 606* 07/14/2012 1036   HDL 27* 07/14/2012 1036   CHOLHDL 7.7 07/14/2012 1036   VLDL NOT CALC 07/14/2012 1036   LDLCALC ** 07/14/2012 1036   Tacrolimus Level  Was 9.7 on 07/28/12 (trough level).  Magnesium was 1.2 on 07/26/2012, and 1.4 on 07/28/2012. CMV DNA on 07/14/2012:  <200  On 07/28/2012:  IgG = 474;   IgA = 6;   IgM = <5  STUDIES: Review of outside studies obtained in [place] included a CT of the head and sinuses with and without contrast 05/24/2012 showing normal brain and clear paranasal sinuses CT of the neck on 05/21/2012 showed no cervical lymphadenopathy or mass with unremarkable submandibular and parotid glands. The thyroid was normal. CT of the chest on the same day showed significant decrease in the large lymph nodes previously noted, with a left internal mammary lymph node measuring 9 mm and a left axillary lymph node at 10 mm. Largest pretracheal lymph node measured 10 mm. Small hypoattenuating liver lesions were unchanged as compared to prior and his spleen size was stable as well. Bone density may 20 02/11/2012 showed normal T score is brain MRI 03/07/2012 was normal  No new results found.   ASSESSMENT: 66 y.o. [place] man with a history of well-differentiated lymphocytic lymphoma/ chronic lymphoid leukemia initially diagnosed in 2000, not requiring intervention until 2006; with multiple chromosomal abnormalities including 17 (not at the site of p53 mutation).  His treatment history is as follows  (1) fludarabine/cyclophosphamide/rituximab  x5 completed May 2007.  (2) rituximab for 8 doses October 2010, with partial response  (3) Leustatin and ofatumumab weekly x8 July to September 2011 followed by maintenance ofatumumab maintenance ofatumumab every 2 months, with  initial response but rising counts September 2012  (4) status-post unrelated donor stem-cell transplant 02/24/2012 at [facility]     (a) conditioning regimen consisted of fludarabine + TBI at 200 cGy, followed by rituximab x27     (b) CMV reactivation x2 (patient CMV positive, donor negative), s/p ganciclovir treatment     (c) GVHD: involving gut and skin, treated with steroids, tacrolimus and MMF     (d) atrial fibrillation: resolved on amiodarone     (e) steroid-induced myopathy: improving     (f) hypomagnesemia     (g) hypogammaglobulinemia: s/p IVIG 07/09/2012     (h) elevated triglycerides (606 on 07/14/2012)   PLAN: ongoing issues include  (1) chronic GVHD prophylaxis: on steroid taper, MMF (CellCept), tacrolimus (Prograf), ursodiol and budesonide--- check tacrolimus levels weekly until stable, then broaden to monthly  (2) CMV reactivation: PCR monthly through October, then reassess  (3) hypogammaglobulinemia: received IgG at 400 mg/kg 07/09/2012; follow levels monthly  (4) immunocompromise: continue prophylaxis with zovirax, diflucan, levofloxacin and Bactrim  (5)  CGVHD assessment weekly initially, then at broadening intervals, with CBC, CMET, maggnesium weekly, IgG, CMV PCR,  and fasting lipids monthly.  Andre will proceed to treatment today for supportive IV fluids along with intravenous magnesium. We are rechecking his magnesium level today, and if necessary will add another infusion for Monday, August 12. I also completed the chronic graft-versus-host disease assessment form today, and this has been separately scanned.   Otherwise, he is scheduled to return next week on August 14 for repeat labs and physical exam. We will plan on proceeding with additional IV fluids and magnesium next Friday, August 16.  Aurther Loft and [place] both voice their understanding and agreement with our plan, and as always know to call with any changes or problems.  I will mention that we have  contacted the clinic in [place] to see what particular parameters we should follow with regards to the patient's hypogammaglobulinemia,and determine at what point we should proceed with IV IgG.   Austynn Pridmore    07/30/2012

## 2012-07-30 NOTE — Patient Instructions (Addendum)
[facility] Discharge Instructions for Patients Receiving Chemotherapy  Today you received the following Magnesium Sulfate and Normal Saline. If you develop nausea and vomiting that is not controlled by your nausea medication, call the clinic. If it is after clinic hours your family physician or the after hours number for the clinic or go to the Emergency Department.   BELOW ARE SYMPTOMS THAT SHOULD BE REPORTED IMMEDIATELY:  *FEVER GREATER THAN 100.5 F  *CHILLS WITH OR WITHOUT FEVER  NAUSEA AND VOMITING THAT IS NOT CONTROLLED WITH YOUR NAUSEA MEDICATION  *UNUSUAL SHORTNESS OF BREATH  *UNUSUAL BRUISING OR BLEEDING  TENDERNESS IN MOUTH AND THROAT WITH OR WITHOUT PRESENCE OF ULCERS  *URINARY PROBLEMS  *BOWEL PROBLEMS  UNUSUAL RASH Items with * indicate a potential emergency and should be followed up as soon as possible.  One of the nurses will contact you 24 hours after your treatment. Please let the nurse know about any problems that you may have experienced. Feel free to call the clinic you have any questions or concerns. The clinic phone number is [phone].   I have been informed and understand all the instructions given to me. I know to contact the clinic, my physician, or go to the Emergency Department if any problems should occur. I do not have any questions at this time, but understand that I may call the clinic during office hours   should I have any questions or need assistance in obtaining follow up care.    __________________________________________  _____________  __________ Signature of Patient or Authorized Representative            Date                   Time    __________________________________________ Nurse's Signature

## 2012-07-30 NOTE — Telephone Encounter (Signed)
Per report received today of CMV level at 875 ( drawn on 7/31)  increased from < 200 per lab on 7/24.  This RN called above to [facility]-and left message on VM per above.

## 2012-07-30 NOTE — Telephone Encounter (Signed)
gve the pt his aug 2013 appt calendar. They are aware that the chemo appts will be added accordingly and they will get another calendar when they come in for the next appt

## 2012-08-02 ENCOUNTER — Other Ambulatory Visit: Payer: Self-pay | Admitting: Oncology

## 2012-08-03 ENCOUNTER — Telehealth: Payer: Self-pay | Admitting: *Deleted

## 2012-08-03 NOTE — Telephone Encounter (Signed)
Pt called to this RN to state he fell last night - he is notably sore today and is inquiring " can someone see me tomorrow when I come in for labs"  Pt is already scheduled for appt with AB/PA.  Called and discussed with pt with verification of appt time.  Hannah states he is able to ambulate but " is sharp pains when I move around ". He is using oxycodone he has in the home with benefit.  No other needs at this time.

## 2012-08-04 ENCOUNTER — Ambulatory Visit (HOSPITAL_BASED_OUTPATIENT_CLINIC_OR_DEPARTMENT_OTHER): Payer: BC Managed Care – PPO | Admitting: Physician Assistant

## 2012-08-04 ENCOUNTER — Ambulatory Visit (HOSPITAL_BASED_OUTPATIENT_CLINIC_OR_DEPARTMENT_OTHER): Payer: BC Managed Care – PPO

## 2012-08-04 ENCOUNTER — Encounter: Payer: Self-pay | Admitting: Physician Assistant

## 2012-08-04 ENCOUNTER — Other Ambulatory Visit (HOSPITAL_BASED_OUTPATIENT_CLINIC_OR_DEPARTMENT_OTHER): Payer: BC Managed Care – PPO | Admitting: Lab

## 2012-08-04 ENCOUNTER — Ambulatory Visit (HOSPITAL_COMMUNITY)
Admission: RE | Admit: 2012-08-04 | Discharge: 2012-08-04 | Disposition: A | Discharge status: Home / Self Care | Payer: BC Managed Care – PPO | Source: Ambulatory Visit | Attending: Physician Assistant | Admitting: Physician Assistant

## 2012-08-04 ENCOUNTER — Telehealth: Payer: Self-pay | Admitting: *Deleted

## 2012-08-04 ENCOUNTER — Other Ambulatory Visit: Payer: Self-pay | Admitting: Oncology

## 2012-08-04 ENCOUNTER — Telehealth: Payer: Self-pay | Admitting: Emergency Medicine

## 2012-08-04 VITALS — BP 88/59 | HR 76 | Temp 97.6°F | Resp 20 | Ht 67.5 in | Wt 208.6 lb

## 2012-08-04 DIAGNOSIS — C9110 Chronic lymphocytic leukemia of B-cell type not having achieved remission: Secondary | ICD-10-CM

## 2012-08-04 DIAGNOSIS — I959 Hypotension, unspecified: Secondary | ICD-10-CM

## 2012-08-04 DIAGNOSIS — R079 Chest pain, unspecified: Secondary | ICD-10-CM | POA: Insufficient documentation

## 2012-08-04 DIAGNOSIS — E860 Dehydration: Secondary | ICD-10-CM

## 2012-08-04 DIAGNOSIS — D89811 Chronic graft-versus-host disease: Secondary | ICD-10-CM

## 2012-08-04 DIAGNOSIS — Z9489 Other transplanted organ and tissue status: Secondary | ICD-10-CM

## 2012-08-04 LAB — CBC WITH DIFFERENTIAL/PLATELET
Basophils Absolute: 0 10*3/uL (ref 0.0–0.1)
Eosinophils Absolute: 0 10*3/uL (ref 0.0–0.5)
HGB: 11 g/dL — ABNORMAL LOW (ref 13.0–17.1)
MCV: 97.4 fL (ref 79.3–98.0)
MONO%: 5.2 % (ref 0.0–14.0)
NEUT#: 10 10*3/uL — ABNORMAL HIGH (ref 1.5–6.5)
RDW: 17.9 % — ABNORMAL HIGH (ref 11.0–14.6)
lymph#: 1.8 10*3/uL (ref 0.9–3.3)

## 2012-08-04 LAB — COMPREHENSIVE METABOLIC PANEL
Albumin: 3.3 g/dL — ABNORMAL LOW (ref 3.5–5.2)
BUN: 32 mg/dL — ABNORMAL HIGH (ref 6–23)
Calcium: 9 mg/dL (ref 8.4–10.5)
Chloride: 99 mEq/L (ref 96–112)
Glucose, Bld: 113 mg/dL — ABNORMAL HIGH (ref 70–99)
Potassium: 4.7 mEq/L (ref 3.5–5.3)

## 2012-08-04 LAB — MAGNESIUM: Magnesium: 1.2 mg/dL — ABNORMAL LOW (ref 1.5–2.5)

## 2012-08-04 MED ORDER — OXYCODONE-ACETAMINOPHEN 5-325 MG PO TABS
2.0000 | ORAL_TABLET | Freq: Once | ORAL | Status: AC
  Administered 2012-08-04: 2 via ORAL

## 2012-08-04 MED ORDER — SODIUM CHLORIDE 0.9 % IV SOLN
INTRAVENOUS | Status: DC
  Administered 2012-08-04: 16:00:00 via INTRAVENOUS

## 2012-08-04 MED ORDER — OXYCODONE HCL 5 MG PO TABS: ORAL_TABLET | ORAL | Status: DC

## 2012-08-04 NOTE — Progress Notes (Signed)
ID: Timothy [name]   DOB: 03/06/1946  MR#: [id number]  WUJ#:[id number]  HISTORY OF PRESENT ILLNESS: We have very complete records from Dr. Sydnee Levans in [place], and in summary:  The patient was initially diagnosed in August 2000, with a white cell count of 23,600, but normal hemoglobin and platelets, and no significant symptomatology. Over the next several years his white cell count drifted up, and he eventually developed some symptoms of night sweats in particular, leading to treatment with fludarabine, Cytoxan and rituxan for five cycles given between December 2006 and May 2007.  We have CT scans from June 2006, November 2006 and April 2007, and comparing the November 2006 and April 2007 scans, there was near complete response. He had subsequent therapy in [place] as detailed below, but with decreased response, leading to allogeneic stem-cell transplant at the [facility] 02/24/2012.  INTERVAL HISTORY: Timothy [name] returns with his wife Timothy [name] for followup, status post allogenic stem cell transplant in March 2013. We are seeing him weekly, with labwork every Wednesday.   Interim history is remarkable for Timothy [name] falling out of bed 2 nights ago. He had actually gotten out of bed and was in the process of lying back down when he fell, hitting his left mid back on the corner of the night stand. The area has a small abrasion and is tender to touch. He tells me that since that time he has had a "stabbing" pain in that left back/flank, averaging a 9 or 9-1/2 on a scale of 1-10 with movement. He had oxycodone on hand at home which Timothy [name] has been taking effectively for the pain. He requests a refill today.   REVIEW OF SYSTEMS: Overall  Timothy [name] feels more fatigued today. His functional status is decreased to do to the pain with movement. He still has one ulcer in his throat that "doesn't feel right", but this is not affecting his eating or drinking. He's had no nausea and denies change in bowel  habits. He's had no dysuria or hematuria, no change in urinary habits. No cough or phlegm production. No hemoptysis. He has shortness of breath with exertion which is not new. He denies chest pain or palpitations. No abnormal headaches, dizziness, or change in vision. Peripheral edema in his lower chest remedies is stable.   A detailed review of systems today was otherwise stable and noncontributory.  PAST MEDICAL HISTORY: Past Medical History  Diagnosis Date  . 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 Diverticular disease   . Hyperlipidemia   . Obesity   . Hypertension   . Hiatal hernia   . CMV (cytomegalovirus) antibody positive     pre-transplant, with seroconversion x2 pst-transplant  . Right bundle branch block     pre-transplant  . CKD (chronic kidney disease) stage 2, GFR 60-89 ml/min   . Pancytopenia   . Steroid-induced diabetes   . Atrial fibrillation     post-transplant  . Myopathy   . 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 Leukemia, chronic lymphoid   . 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 Tonsillectomy and adenoidectomy     FAMILY HISTORY Family History  Problem Relation Age of Onset  . Cancer Father    The patient's father died from complications of chronic lymphocytic leukemia at the age of 51.  It had been diagnosed seven years before when he was 15.  The patient  is enrolled in a familial chronic lymphocytic leukemia study out of the [facility].  The patient's mother is 68, alive, unfortunately suffering with dementia, and he has a brother, 56, who is otherwise in fair health.   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  His wife of >40 years, Timothy [name], is a homemaker.  Their daughter, [name], lives in  [place].  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  The patient has an 70 year old grandson and an 63-year-old granddaughter, and that is really the main reason is moved to this area.  He is a International aid/development worker.     ADVANCED DIRECTIVES:  HEALTH MAINTENANCE: History  Substance Use Topics  . Smoking status: Never Smoker   . Smokeless tobacco: Never Used  . Alcohol Use:      Colonoscopy:  PAP:  Bone density:  Lipid panel:  No Known Allergies  Current Outpatient Prescriptions  Medication Sig Dispense Refill  . acyclovir (ZOVIRAX) 800 MG tablet       . Alum & Mag Hydroxide-Simeth (MAGIC MOUTHWASH W/LIDOCAINE) SOLN Take 5 mLs by mouth 4 (four) times daily as needed.      Marland Kitchen atorvastatin (LIPITOR) 10 MG tablet Take 10 mg by mouth daily.        . Beclomethasone Dipropionate POWD In corn oil emulsion to equal 1mg  per ml  On tapered dose Take with food  200 g  2  . bisoprolol-hydrochlorothiazide (ZIAC) 10-6.25 MG per tablet       . Calcium Citrate (CITRACAL PO) Take 1,200 mg by mouth 2 (two) times daily.      . cholecalciferol (VITAMIN D) 1000 UNITS tablet Take 1,000 Units by mouth daily.      Marland Kitchen dexamethasone 0.5 MG/5ML elixir Take 10 mLs (1 mg total) by mouth 4 (four) times daily. Rinse with 5-10 mL for 4 to 5 minutes 4-6 times daily. Use as a swish and spit rinse.  480 mL  0  . labetalol (NORMODYNE) 200 MG tablet Take 1 tablet (200 mg total) by mouth 2 (two) times daily.  60 tablet  6  . levofloxacin (LEVAQUIN) 750 MG tablet Take 1 tablet (750 mg total) by mouth daily.  30 tablet  12  . lisinopril (PRINIVIL,ZESTRIL) 10 MG tablet Take 1 tablet (10 mg total) by mouth daily.  90 tablet  11  . LORazepam (ATIVAN) 0.5 MG tablet Take 1 tablet (0.5 mg total) by mouth every 6 (six) hours as needed for anxiety.  120 tablet  2  . Multiple Vitamin (MULTIVITAMIN) tablet Take 1 tablet by mouth daily.      . mycophenolate (CELLCEPT) 250 MG capsule       . omega-3 acid ethyl esters (LOVAZA) 1 G capsule Take 1 g by mouth 2  (two) times daily.      Marland Kitchen PRILOSEC OTC 20 MG tablet       . prochlorperazine (COMPAZINE) 10 MG tablet       . sertraline (ZOLOFT) 50 MG tablet       . SF 1.1 % GEL dental gel       . sulfamethoxazole-trimethoprim (BACTRIM DS) 800-160 MG per tablet       . tacrolimus (PROGRAF) 1 MG capsule Take 5 capsules (5 mg total) by mouth 2 (two) times daily.  60 capsule  3  . ursodiol (ACTIGALL) 300 MG capsule Take 2 capsules (600 mg total) by mouth  2 (two) times daily.  360 capsule  12  . amLODipine (NORVASC) 5 MG tablet Take 5 mg by mouth daily.        . budesonide (ENTOCORT EC) 3 MG 24 hr capsule Take 1 capsule (3 mg total) by mouth every morning.  60 capsule  12  . fluconazole (DIFLUCAN) 200 MG tablet Take 1 tablet (200 mg total) by mouth daily.  30 tablet  6  . oxyCODONE (ROXICODONE) 5 MG immediate release tablet 1-2 tab PO q 6 Hr PRN pain  30 tablet  0  . predniSONE (DELTASONE) 20 MG tablet       . trandolapril (MAVIK) 4 MG tablet Take 4 mg by mouth daily.         Current Facility-Administered Medications  Medication Dose Route Frequency Provider Last Rate Last Dose  . 0.9 %  sodium chloride infusion   Intravenous Continuous Catalina Gravel, PA 1,000 mL/hr at 08/04/12 1610    . oxyCODONE-acetaminophen (PERCOCET/ROXICET) 5-325 MG per tablet 2 tablet  2 tablet Oral Once Catalina Gravel, PA   2 tablet at 08/04/12 1610    OBJECTIVE: Middle-aged white male who appears weak and tired but in no acute distress Filed Vitals:   08/04/12 1502  BP: 88/59  Pulse: 76  Temp: 97.6 F (36.4 C)  Resp: 20     Body mass index is 32.19 kg/(m^2).     ECOG FS: 2 Filed Weights   08/04/12 1502  Weight: 208 lb 9.6 oz (94.62 kg)    Sclerae anicteric. Oropharynx shows no thrush, and only one stable lesion in the right posterior pharynx. No cervical or supraclavicular adenopathy Lungs  clear to auscultation bilaterally, good excursion bilaterally Heart regular rate and rhythm, no murmur appreciated Abd soft, nontender,  positive bowel sounds, no organomegaly MSK no focal spinal tenderness, minimal nonpitting pedal edema, equal bilaterally,  No significant limits in ROM, although movement in general is somewhat limited by patient's discomfort. Neuro: nonfocal, appropriate affect, alert and oriented x3; strength is 5 over 5 upper limbs, 5 minus over 5 lower limbs, including dorsiflexion. Skin: Benign with no rash  LAB RESULTS: Lab Results  Component Value Date   WBC 12.6* 08/04/2012   NEUTROABS 10.0* 08/04/2012   HGB 11.0* 08/04/2012   HCT 32.5* 08/04/2012   MCV 97.4 08/04/2012   PLT 103* 08/04/2012      Chemistry      Component Value Date/Time   NA 133* 08/04/2012 1445   K 4.7 08/04/2012 1445   CL 99 08/04/2012 1445   CO2 22 08/04/2012 1445   BUN 32* 08/04/2012 1445   CREATININE 1.55* 08/04/2012 1445      Component Value Date/Time   CALCIUM 9.0 08/04/2012 1445   ALKPHOS 55 08/04/2012 1445   AST 25 08/04/2012 1445   ALT 21 08/04/2012 1445   BILITOT 0.3 08/04/2012 1445       Lipid Panel     Component Value Date/Time   CHOL 209* 07/14/2012 1036   TRIG 606* 07/14/2012 1036   HDL 27* 07/14/2012 1036   CHOLHDL 7.7 07/14/2012 1036   VLDL NOT CALC 07/14/2012 1036   LDLCALC ** 07/14/2012 1036   Tacrolimus Level  Was 9.7 on 07/28/12 (trough level).  Today's result (08/04/12) is pending.  Magnesium was 1.2 on 07/26/2012, and 1.4 on 07/30/2012., and is being repeated today (08/04/2012).  CMV DNA on 07/14/2012:  <200, then was elevated at 875 on 07/21/12.  This has been redrawn today (08/04/2012) with the  results pending.  On 07/28/2012:  IgG = 474;   IgA = 6;   IgM = <5  STUDIES: Review of outside studies obtained in [place] included a CT of the head and sinuses with and without contrast 05/24/2012 showing normal brain and clear paranasal sinuses CT of the neck on 05/21/2012 showed no cervical lymphadenopathy or mass with unremarkable submandibular and parotid glands. The thyroid was normal. CT of the chest on the same  day showed significant decrease in the large lymph nodes previously noted, with a left internal mammary lymph node measuring 9 mm and a left axillary lymph node at 10 mm. Largest pretracheal lymph node measured 10 mm. Small hypoattenuating liver lesions were unchanged as compared to prior and his spleen size was stable as well. Bone density may 20 02/11/2012 showed normal T score is brain MRI 03/07/2012 was normal  No new results found.   ASSESSMENT: 66 y.o. [place] man with a history of well-differentiated lymphocytic lymphoma/ chronic lymphoid leukemia initially diagnosed in 2000, not requiring intervention until 2006; with multiple chromosomal abnormalities including 17 (not at the site of p53 mutation).  His treatment history is as follows  (1) fludarabine/cyclophosphamide/rituximab x5 completed May 2007.  (2) rituximab for 8 doses October 2010, with partial response  (3) Leustatin and ofatumumab weekly x8 July to September 2011 followed by maintenance ofatumumab maintenance ofatumumab every 2 months, with initial response but rising counts September 2012  (4) status-post unrelated donor stem-cell transplant 02/24/2012 at the [facility]     (a) conditioning regimen consisted of fludarabine + TBI at 200 cGy, followed by rituximab x27     (b) CMV reactivation x2 (patient CMV positive, donor negative), s/p ganciclovir treatment     (c) GVHD: involving gut and skin, treated with steroids, tacrolimus and MMF     (d) atrial fibrillation: resolved on amiodarone     (e) steroid-induced myopathy: improving     (f) hypomagnesemia     (g) hypogammaglobulinemia: s/p IVIG 07/09/2012     (h) elevated triglycerides (606 on 07/14/2012)   PLAN: ongoing issues include  (1) chronic GVHD prophylaxis: on steroid taper, MMF (CellCept), tacrolimus (Prograf), ursodiol and budesonide--- check tacrolimus levels weekly until stable, then broaden to monthly  (2) CMV reactivation: PCR monthly through  October, then reassess  (3) hypogammaglobulinemia: received IgG at 400 mg/kg 07/09/2012; follow levels monthly  (4) immunocompromise: continue prophylaxis with zovirax, diflucan, levofloxacin and Bactrim  (5)  CGVHD assessment weekly initially, then at broadening intervals, with CBC, CMET, maggnesium weekly, IgG, CMV PCR,  and fasting lipids monthly.  Dr. Darnelle Catalan has reviewed this case today. Per his suggestion, Timothy [name] will receive supportive IV fluids today for mild dehydration with hypotension. We will also send teary for a chest x-ray today to evaluate for rib fracture and possible pneumothorax in light of his recent fall and subsequent pain.    I have refilled his oxycodone, 5 mg, 1-2 tablets every 6 hours as needed for pain. He is taking this sparingly.  Timothy [name] will return on Friday, August 16, for labs, supportive IV fluids, and IV magnesium. If his magnesium is still low, we will likely begin giving him magnesium IV 3 times weekly: Mondays, Wednesdays, and Fridays. Timothy [name] will be seeing Dr. Darnelle Catalan for followup next week on August 23.   I also completed the chronic graft-versus-host disease assessment form today, and this has been separately scanned.   Per written orders from a [facility], we will also be requesting a bone marrow biopsy, blood draw,  and imaging studies be done first week of September, with all results forwarded to their office.   Aurther Loft and [place] both voice their understanding and agreement with our plan, and as always know to call with any changes or problems.  Timothy [name]    08/04/2012

## 2012-08-04 NOTE — Telephone Encounter (Signed)
Gave patient instructions on going over to radiology department to get his chest x-ray done after the treatment

## 2012-08-04 NOTE — Telephone Encounter (Signed)
Thayer Ohm from the [facility] called in regards to follow up on ordering patient's CT, bone marrow and labs.   Studies are in the process of being ordered.  Contact number for Thayer Ohm is [phone]  Results can be faxed to [phone]

## 2012-08-04 NOTE — Progress Notes (Signed)
We re-sent a CMV-PCR probe today but as it may not be back for several days and we had a bump in his last titer we are starting valgancyclovir at 900 mg po BID x 1 wee, then daily as per protocol. If today's repeat returns low we will hold off on this treatment.  I called the pt w esults of his CXR which show no fracture or PTX.  GM

## 2012-08-04 NOTE — Telephone Encounter (Signed)
Gave patient instruction on going over the get his x-ray done in radiology department after treatment

## 2012-08-05 ENCOUNTER — Telehealth: Payer: Self-pay | Admitting: *Deleted

## 2012-08-05 LAB — TACROLIMUS LEVEL: Tacrolimus Lvl: 9 ng/mL (ref 5.0–20.0)

## 2012-08-05 NOTE — Telephone Encounter (Signed)
Per staff message and POF I have scheduled appts.  JMW  

## 2012-08-05 NOTE — Telephone Encounter (Signed)
Emailed michelle so she can set up patient's treatment

## 2012-08-05 NOTE — Telephone Encounter (Signed)
made patient appointment for the scans on 08-25-2012 per central scheduling books spoke the [place] in the radiololgy so that contact the patient needing a bone marrow orders requeting it be done the IR

## 2012-08-06 ENCOUNTER — Ambulatory Visit (HOSPITAL_BASED_OUTPATIENT_CLINIC_OR_DEPARTMENT_OTHER): Payer: BC Managed Care – PPO

## 2012-08-06 ENCOUNTER — Ambulatory Visit: Payer: BC Managed Care – PPO | Admitting: Oncology

## 2012-08-06 ENCOUNTER — Telehealth: Payer: Self-pay | Admitting: *Deleted

## 2012-08-06 ENCOUNTER — Other Ambulatory Visit (HOSPITAL_BASED_OUTPATIENT_CLINIC_OR_DEPARTMENT_OTHER): Payer: BC Managed Care – PPO | Admitting: Lab

## 2012-08-06 VITALS — BP 124/71 | HR 75 | Temp 97.0°F | Resp 20

## 2012-08-06 DIAGNOSIS — C9110 Chronic lymphocytic leukemia of B-cell type not having achieved remission: Secondary | ICD-10-CM

## 2012-08-06 DIAGNOSIS — Z9489 Other transplanted organ and tissue status: Secondary | ICD-10-CM

## 2012-08-06 DIAGNOSIS — D89811 Chronic graft-versus-host disease: Secondary | ICD-10-CM

## 2012-08-06 LAB — CBC WITH DIFFERENTIAL/PLATELET
Basophils Absolute: 0 10*3/uL (ref 0.0–0.1)
Eosinophils Absolute: 0 10*3/uL (ref 0.0–0.5)
HCT: 29.6 % — ABNORMAL LOW (ref 38.4–49.9)
HGB: 10.1 g/dL — ABNORMAL LOW (ref 13.0–17.1)
LYMPH%: 16.6 % (ref 14.0–49.0)
MCH: 32.9 pg (ref 27.2–33.4)
MCV: 96.4 fL (ref 79.3–98.0)
MONO%: 4.9 % (ref 0.0–14.0)
NEUT#: 7.1 10*3/uL — ABNORMAL HIGH (ref 1.5–6.5)
NEUT%: 77.9 % — ABNORMAL HIGH (ref 39.0–75.0)
Platelets: 83 10*3/uL — ABNORMAL LOW (ref 140–400)

## 2012-08-06 LAB — COMPREHENSIVE METABOLIC PANEL
Albumin: 3.1 g/dL — ABNORMAL LOW (ref 3.5–5.2)
Alkaline Phosphatase: 47 U/L (ref 39–117)
BUN: 35 mg/dL — ABNORMAL HIGH (ref 6–23)
Creatinine, Ser: 1.55 mg/dL — ABNORMAL HIGH (ref 0.50–1.35)
Glucose, Bld: 82 mg/dL (ref 70–99)
Potassium: 4.4 mEq/L (ref 3.5–5.3)
Total Bilirubin: 0.3 mg/dL (ref 0.3–1.2)

## 2012-08-06 MED ORDER — SODIUM CHLORIDE 0.9 % IV SOLN
INTRAVENOUS | Status: DC
  Administered 2012-08-06: 15:00:00 via INTRAVENOUS
  Filled 2012-08-06: qty 1000

## 2012-08-06 NOTE — Telephone Encounter (Signed)
Made patient appointment for Dr.Mackler on 08-10-2012 at 11:00am

## 2012-08-06 NOTE — Patient Instructions (Addendum)
[facility] Discharge Instructions for Patients Receiving Chemotherapy  Today you received the following Magnesium Sulfate and Normal Saline  To help prevent nausea and vomiting after your treatment, we encourage you to take your nausea medication  Begin taking it at 7 pm and take it as often as prescribed for the next 24 to 72 hours.   If you develop nausea and vomiting that is not controlled by your nausea medication, call the clinic. If it is after clinic hours your family physician or the after hours number for the clinic or go to the Emergency Department.   BELOW ARE SYMPTOMS THAT SHOULD BE REPORTED IMMEDIATELY:  *FEVER GREATER THAN 100.5 F  *CHILLS WITH OR WITHOUT FEVER  NAUSEA AND VOMITING THAT IS NOT CONTROLLED WITH YOUR NAUSEA MEDICATION  *UNUSUAL SHORTNESS OF BREATH  *UNUSUAL BRUISING OR BLEEDING  TENDERNESS IN MOUTH AND THROAT WITH OR WITHOUT PRESENCE OF ULCERS  *URINARY PROBLEMS  *BOWEL PROBLEMS  UNUSUAL RASH Items with * indicate a potential emergency and should be followed up as soon as possible.  One of the nurses will contact you 24 hours after your treatment. Please let the nurse know about any problems that you may have experienced. Feel free to call the clinic you have any questions or concerns. The clinic phone number is [phone].   I have been informed and understand all the instructions given to me. I know to contact the clinic, my physician, or go to the Emergency Department if any problems should occur. I do not have any questions at this time, but understand that I may call the clinic during office hours   should I have any questions or need assistance in obtaining follow up care.    __________________________________________  _____________  __________ Signature of Patient or Authorized Representative            Date                   Time    __________________________________________ Nurse's Signature

## 2012-08-08 LAB — LIPID PANEL
Cholesterol: 209 mg/dL — ABNORMAL HIGH (ref 0–200)
Triglycerides: 480 mg/dL — ABNORMAL HIGH (ref <150)

## 2012-08-08 LAB — IGG, IGA, IGM
IgG (Immunoglobin G), Serum: 393 mg/dL — ABNORMAL LOW (ref 650–1600)
IgM, Serum: <5 mg/dL — ABNORMAL LOW (ref 41–251)

## 2012-08-08 LAB — TACROLIMUS LEVEL: Tacrolimus Lvl: 6.9 ng/mL (ref 5.0–20.0)

## 2012-08-09 ENCOUNTER — Ambulatory Visit (HOSPITAL_BASED_OUTPATIENT_CLINIC_OR_DEPARTMENT_OTHER): Payer: BC Managed Care – PPO

## 2012-08-09 ENCOUNTER — Other Ambulatory Visit: Payer: Self-pay | Admitting: Emergency Medicine

## 2012-08-09 ENCOUNTER — Other Ambulatory Visit (HOSPITAL_BASED_OUTPATIENT_CLINIC_OR_DEPARTMENT_OTHER): Payer: BC Managed Care – PPO | Admitting: Lab

## 2012-08-09 VITALS — BP 123/69 | HR 74 | Temp 98.0°F | Resp 20

## 2012-08-09 DIAGNOSIS — Z9489 Other transplanted organ and tissue status: Secondary | ICD-10-CM

## 2012-08-09 DIAGNOSIS — C9110 Chronic lymphocytic leukemia of B-cell type not having achieved remission: Secondary | ICD-10-CM

## 2012-08-09 DIAGNOSIS — D89811 Chronic graft-versus-host disease: Secondary | ICD-10-CM

## 2012-08-09 LAB — CBC WITH DIFFERENTIAL/PLATELET
Basophils Absolute: 0 10*3/uL (ref 0.0–0.1)
Eosinophils Absolute: 0 10*3/uL (ref 0.0–0.5)
HCT: 29 % — ABNORMAL LOW (ref 38.4–49.9)
LYMPH%: 20.3 % (ref 14.0–49.0)
MCV: 92.4 fL (ref 79.3–98.0)
MONO#: 0.3 10*3/uL (ref 0.1–0.9)
MONO%: 4.6 % (ref 0.0–14.0)
NEUT#: 5.4 10*3/uL (ref 1.5–6.5)
NEUT%: 74.4 % (ref 39.0–75.0)
Platelets: 77 10*3/uL — ABNORMAL LOW (ref 140–400)
RBC: 3.14 10*6/uL — ABNORMAL LOW (ref 4.20–5.82)
WBC: 7.2 10*3/uL (ref 4.0–10.3)
nRBC: 0 % (ref 0–0)

## 2012-08-09 MED ORDER — SODIUM CHLORIDE 0.9 % IV SOLN
Freq: Once | INTRAVENOUS | Status: AC
  Administered 2012-08-09: 15:00:00 via INTRAVENOUS
  Filled 2012-08-09: qty 1000

## 2012-08-09 NOTE — Patient Instructions (Addendum)
[facility] Discharge Instructions for Patients Receiving Chemotherapy  Today you received the following 1000 mg of Magnesium Sulfate and Normal Saline.If you develop nausea and vomiting that is not controlled by your nausea medication,  call the clinic. If it is after clinic hours your family physician or the after hours number for the clinic or go to the Emergency Department.   BELOW ARE SYMPTOMS THAT SHOULD BE REPORTED IMMEDIATELY:  *FEVER GREATER THAN 100.5 F  *CHILLS WITH OR WITHOUT FEVER  NAUSEA AND VOMITING THAT IS NOT CONTROLLED WITH YOUR NAUSEA MEDICATION  *UNUSUAL SHORTNESS OF BREATH  *UNUSUAL BRUISING OR BLEEDING  TENDERNESS IN MOUTH AND THROAT WITH OR WITHOUT PRESENCE OF ULCERS  *URINARY PROBLEMS  *BOWEL PROBLEMS  UNUSUAL RASH Items with * indicate a potential emergency and should be followed up as soon as possible.  One of the nurses will contact you 24 hours after your treatment. Please let the nurse know about any problems that you may have experienced. Feel free to call the clinic you have any questions or concerns. The clinic phone number is [phone].   I have been informed and understand all the instructions given to me. I know to contact the clinic, my physician, or go to the Emergency Department if any problems should occur. I do not have any questions at this time, but understand that I may call the clinic during office hours   should I have any questions or need assistance in obtaining follow up care.    __________________________________________  _____________  __________ Signature of Patient or Authorized Representative            Date                   Time    __________________________________________ Nurse's Signature

## 2012-08-10 ENCOUNTER — Other Ambulatory Visit: Payer: Self-pay | Admitting: Oncology

## 2012-08-10 ENCOUNTER — Encounter: Payer: Self-pay | Admitting: Cardiology

## 2012-08-10 ENCOUNTER — Ambulatory Visit (INDEPENDENT_AMBULATORY_CARE_PROVIDER_SITE_OTHER): Payer: BC Managed Care – PPO | Admitting: Cardiology

## 2012-08-10 ENCOUNTER — Telehealth: Payer: Self-pay | Admitting: *Deleted

## 2012-08-10 ENCOUNTER — Other Ambulatory Visit: Payer: Self-pay | Admitting: *Deleted

## 2012-08-10 VITALS — BP 100/62 | HR 75 | Ht 68.0 in | Wt 215.8 lb

## 2012-08-10 DIAGNOSIS — C9110 Chronic lymphocytic leukemia of B-cell type not having achieved remission: Secondary | ICD-10-CM

## 2012-08-10 DIAGNOSIS — C9590 Leukemia, unspecified not having achieved remission: Secondary | ICD-10-CM

## 2012-08-10 DIAGNOSIS — I10 Essential (primary) hypertension: Secondary | ICD-10-CM

## 2012-08-10 DIAGNOSIS — I4891 Unspecified atrial fibrillation: Secondary | ICD-10-CM

## 2012-08-10 DIAGNOSIS — I4510 Unspecified right bundle-branch block: Secondary | ICD-10-CM

## 2012-08-10 MED ORDER — IMMUNE GLOBULIN (HUMAN) 10 GM/200ML IV SOLN
400.0000 mg/kg | Freq: Once | INTRAVENOUS | Status: DC
  Filled 2012-08-10: qty 800

## 2012-08-10 NOTE — Telephone Encounter (Signed)
Per orders from 08-10-2012 sent michelle email to set up patient for 08-11-2012 adding on ivig on 8/21

## 2012-08-10 NOTE — Telephone Encounter (Signed)
spoke with the patient's wife and she confirmed over the phone the new time for 08-11-2012 but the same day just a different time

## 2012-08-10 NOTE — Progress Notes (Signed)
[name] Date of Birth: 1946/11/07 Medical Record #[id number]  History of Present Illness: Mr. Mentzer is seen at the request of Dr. Darnelle Catalan for evaluation of atrial fibrillation. He is a very pleasant 66 year old white male with a history of chronic lymphoid leukemia. This was initially diagnosed in 2000. He was treated with chemotherapy in 2007 and again in 2010. In March of this year he underwent a stem cell transplant in [place]. His hospital stay was complicated by high fevers. During this time he developed atrial fibrillation and was placed on amiodarone. This was subsequently discontinued. To the patient's knowledge he has had no recurrence of any arrhythmia since then. He was also significant hypertensive and his blood pressure medication was adjusted. On followup today he denies any significant cardiac complaints. He denies any palpitations, dizziness, chest pain, or shortness of breath. He did have an echocardiogram here in 2010 which was normal. He does have a chronic right bundle branch block. He apparently had an echocardiogram during his hospital stay in March but I do not have those results. He reports a stress test in the remote past which was normal. He has no history of coronary disease or congestive heart failure.  Current Outpatient Prescriptions on File Prior to Visit  Medication Sig Dispense Refill  . Alum & Mag Hydroxide-Simeth (MAGIC MOUTHWASH W/LIDOCAINE) SOLN Take 5 mLs by mouth 4 (four) times daily as needed.      Marland Kitchen amLODipine (NORVASC) 5 MG tablet Take 5 mg by mouth daily.        . Calcium Citrate (CITRACAL PO) Take 1,200 mg by mouth 2 (two) times daily.      . cholecalciferol (VITAMIN D) 1000 UNITS tablet Take 1,000 Units by mouth daily.      Marland Kitchen dexamethasone 0.5 MG/5ML elixir Take 10 mLs (1 mg total) by mouth 4 (four) times daily. Rinse with 5-10 mL for 4 to 5 minutes 4-6 times daily. Use as a swish and spit rinse.  480 mL  0  . levofloxacin (LEVAQUIN)  750 MG tablet Take 1 tablet (750 mg total) by mouth daily.  30 tablet  12  . lisinopril (PRINIVIL,ZESTRIL) 10 MG tablet Take 1 tablet (10 mg total) by mouth daily.  90 tablet  11  . LORazepam (ATIVAN) 0.5 MG tablet Take 1 tablet (0.5 mg total) by mouth every 6 (six) hours as needed for anxiety.  120 tablet  2  . Multiple Vitamin (MULTIVITAMIN) tablet Take 1 tablet by mouth daily.      Marland Kitchen omega-3 acid ethyl esters (LOVAZA) 1 G capsule Take 1 g by mouth 2 (two) times daily.      Marland Kitchen oxyCODONE (ROXICODONE) 5 MG immediate release tablet 1-2 tab PO q 6 Hr PRN pain  30 tablet  0  . PRILOSEC OTC 20 MG tablet       . sertraline (ZOLOFT) 50 MG tablet       . SF 1.1 % GEL dental gel       . sulfamethoxazole-trimethoprim (BACTRIM DS) 800-160 MG per tablet       . ursodiol (ACTIGALL) 300 MG capsule Take 2 capsules (600 mg total) by mouth 2 (two) times daily.  360 capsule  12  . DISCONTD: Beclomethasone Dipropionate POWD In corn oil emulsion to equal 1mg  per ml  On tapered dose Take with food  200 g  2  . DISCONTD: labetalol (NORMODYNE) 200 MG tablet Take 1 tablet (200 mg total) by mouth 2 (two) times daily.  60  tablet  6   No current facility-administered medications on file prior to visit.    Allergies  Allergen Reactions  . Benadryl (Diphenhydramine Hcl)     "Restless leg syndrome"    Past Medical History  Diagnosis Date  . 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 Diverticular disease   . Hyperlipidemia   . Obesity   . Hypertension   . Hiatal hernia   . CMV (cytomegalovirus) antibody positive     pre-transplant, with seroconversion x2 pst-transplant  . Right bundle branch block     pre-transplant  . CKD (chronic kidney disease) stage 2, GFR 60-89 ml/min   . Pancytopenia   . Steroid-induced diabetes   . Atrial fibrillation     post-transplant  . Myopathy   . 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 Leukemia, chronic lymphoid   . Chronic  graft-versus-host disease     Past Surgical History  Procedure Date  . Tonsillectomy and adenoidectomy   . Bone marrow transplant     History  Smoking status  . Never Smoker   Smokeless tobacco  . Never Used    History  Alcohol Use     Family History  Problem Relation Age of Onset  . Cancer Father     Review of Systems: The review of systems is positive for a small ulcer on his palate. He still has generalized weakness. All other systems were reviewed and are negative.  Physical Exam: BP 100/62  Pulse 75  Ht 5\' 8"  (1.727 m)  Wt 97.886 kg (215 lb 12.8 oz)  BMI 32.81 kg/m2 He is a pleasant white male in no acute distress. He is normocephalic, atraumatic. Pupils are equal round and reactive. Sclera are anicteric. Oropharynx is clear. Neck is supple without adenopathy, JVD, bruits, or thyromegaly. Lungs are clear. Cardiac exam reveals a regular rate and rhythm without gallop, murmur, or click. Abdomen is obese, soft, nontender. Extremities are without pitting edema. He has slight edema on the left. Pedal pulses are palpable. Skin is warm and dry. He does have a central catheter in the right subclavian area. He is alert and oriented x3. Cranial nerves II through XII are intact. LABORATORY DATA: ECG demonstrates normal sinus rhythm with a rate of 75 beats per minute. He has a right bundle branch block. T waves are inverted in leads 3 and aVF.  Assessment / Plan: 1. History of atrial fibrillation. This was in the setting of significant stressors including high fever post bone marrow transplant. His arrhythmia resolved. We will obtain a copy of his cardiac evaluation at that time including his echocardiogram to confirm that his heart is structurally okay. If so, then I think it is unlikely that he will have recurrence of atrial fibrillation and I'll be happy to see him on an as-needed basis. He does not require antiarrhythmic drug therapy or anticoagulation at this time.  2. Chronic  lymphoid leukemia status post previous chemotherapy, total body radiation, and subsequent bone marrow stem cell transplant.  3. Hypertension. Blood pressure is very well controlled today. If his blood pressure should become low less than 100 systolic I would consider reducing his antihypertensive therapy which currently includes amlodipine, trandolapril, and labetalol.  4. Chronic right bundle branch block.

## 2012-08-10 NOTE — Patient Instructions (Signed)
Continue your current medication  If your blood pressure drops below 100 systolic we may need to reduce your medication  I will see you as needed for any cardiac problems  We will request your records from [place].

## 2012-08-11 ENCOUNTER — Other Ambulatory Visit: Payer: Self-pay | Admitting: Oncology

## 2012-08-11 ENCOUNTER — Ambulatory Visit (HOSPITAL_BASED_OUTPATIENT_CLINIC_OR_DEPARTMENT_OTHER): Payer: BC Managed Care – PPO

## 2012-08-11 ENCOUNTER — Telehealth: Payer: Self-pay | Admitting: Oncology

## 2012-08-11 ENCOUNTER — Other Ambulatory Visit: Payer: Self-pay | Admitting: Emergency Medicine

## 2012-08-11 ENCOUNTER — Telehealth: Payer: Self-pay

## 2012-08-11 ENCOUNTER — Other Ambulatory Visit (HOSPITAL_BASED_OUTPATIENT_CLINIC_OR_DEPARTMENT_OTHER): Payer: BC Managed Care – PPO | Admitting: Lab

## 2012-08-11 VITALS — BP 140/65 | HR 76 | Temp 98.2°F | Resp 18

## 2012-08-11 DIAGNOSIS — C9110 Chronic lymphocytic leukemia of B-cell type not having achieved remission: Secondary | ICD-10-CM

## 2012-08-11 DIAGNOSIS — Z9489 Other transplanted organ and tissue status: Secondary | ICD-10-CM

## 2012-08-11 DIAGNOSIS — B259 Cytomegaloviral disease, unspecified: Secondary | ICD-10-CM

## 2012-08-11 DIAGNOSIS — D89811 Chronic graft-versus-host disease: Secondary | ICD-10-CM

## 2012-08-11 DIAGNOSIS — Z5111 Encounter for antineoplastic chemotherapy: Secondary | ICD-10-CM

## 2012-08-11 LAB — CBC WITH DIFFERENTIAL/PLATELET
BASO%: 0.7 % (ref 0.0–2.0)
EOS%: 0.5 % (ref 0.0–7.0)
HCT: 30.8 % — ABNORMAL LOW (ref 38.4–49.9)
MCH: 32.9 pg (ref 27.2–33.4)
MCHC: 33.8 g/dL (ref 32.0–36.0)
MONO%: 3.4 % (ref 0.0–14.0)
NEUT%: 72 % (ref 39.0–75.0)
RDW: 17.5 % — ABNORMAL HIGH (ref 11.0–14.6)
lymph#: 1.5 10*3/uL (ref 0.9–3.3)

## 2012-08-11 MED ORDER — HEPARIN SOD (PORK) LOCK FLUSH 100 UNIT/ML IV SOLN
500.0000 [IU] | Freq: Once | INTRAVENOUS | Status: AC | PRN
  Administered 2012-08-11: 250 [IU]
  Filled 2012-08-11: qty 5

## 2012-08-11 MED ORDER — ACETAMINOPHEN 325 MG PO TABS
650.0000 mg | ORAL_TABLET | Freq: Once | ORAL | Status: AC
  Administered 2012-08-11: 650 mg via ORAL

## 2012-08-11 MED ORDER — SODIUM CHLORIDE 0.9 % IJ SOLN
10.0000 mL | INTRAMUSCULAR | Status: DC | PRN
  Administered 2012-08-11: 10 mL
  Filled 2012-08-11: qty 10

## 2012-08-11 MED ORDER — IMMUNE GLOBULIN (HUMAN) 10 GM/200ML IV SOLN
400.0000 mg/kg | Freq: Once | INTRAVENOUS | Status: AC
  Administered 2012-08-11: 40 g via INTRAVENOUS
  Filled 2012-08-11: qty 800

## 2012-08-11 MED ORDER — DIPHENHYDRAMINE HCL 25 MG PO CAPS
25.0000 mg | ORAL_CAPSULE | Freq: Once | ORAL | Status: AC
  Administered 2012-08-11: 25 mg via ORAL

## 2012-08-11 MED ORDER — LORAZEPAM 2 MG/ML IJ SOLN
0.5000 mg | Freq: Once | INTRAMUSCULAR | Status: AC
  Administered 2012-08-11: 0.5 mg via INTRAVENOUS

## 2012-08-11 MED ORDER — SODIUM CHLORIDE 0.9 % IV SOLN
INTRAVENOUS | Status: AC
  Administered 2012-08-11: 10:00:00 via INTRAVENOUS
  Filled 2012-08-11: qty 1000

## 2012-08-11 MED ORDER — SODIUM CHLORIDE 0.9 % IJ SOLN
10.0000 mL | INTRAMUSCULAR | Status: AC | PRN
  Administered 2012-08-11: 10 mL via INTRAVENOUS
  Filled 2012-08-11: qty 10

## 2012-08-11 MED ORDER — HEPARIN SOD (PORK) LOCK FLUSH 100 UNIT/ML IV SOLN
500.0000 [IU] | Freq: Once | INTRAVENOUS | Status: AC
  Administered 2012-08-11: 250 [IU] via INTRAVENOUS
  Filled 2012-08-11: qty 5

## 2012-08-11 NOTE — Telephone Encounter (Signed)
S/w valerie from the lymphedema clinic and the pt is schedule for an appt for physical therapy on 08/18/2012@1 :45pm. Per the office they will call the pt with the appt

## 2012-08-11 NOTE — Telephone Encounter (Signed)
Patient called spoke to wife she gave me fax # [phone] to [facility] in [place].Records will be requested echo,stress test all cardiac records.

## 2012-08-11 NOTE — Patient Instructions (Signed)
[facility] Discharge Instructions for Patients Receiving IVIG Magnesium. To help prevent nausea and vomiting after your treatment, we encourage you to take your nausea medication as per Dr. Darnelle Catalan. If you develop nausea and vomiting that is not controlled by your nausea medication, call the clinic. If it is after clinic hours your family physician or the after hours number for the clinic or go to the Emergency Department.   BELOW ARE SYMPTOMS THAT SHOULD BE REPORTED IMMEDIATELY:  *FEVER GREATER THAN 100.5 F  *CHILLS WITH OR WITHOUT FEVER  NAUSEA AND VOMITING THAT IS NOT CONTROLLED WITH YOUR NAUSEA MEDICATION  *UNUSUAL SHORTNESS OF BREATH  *UNUSUAL BRUISING OR BLEEDING  TENDERNESS IN MOUTH AND THROAT WITH OR WITHOUT PRESENCE OF ULCERS  *URINARY PROBLEMS  *BOWEL PROBLEMS  UNUSUAL RASH Items with * indicate a potential emergency and should be followed up as soon as possible.  . Feel free to call the clinic you have any questions or concerns. The clinic phone number is [phone].   I have been informed and understand all the instructions given to me. I know to contact the clinic, my physician, or go to the Emergency Department if any problems should occur. I do not have any questions at this time, but understand that I may call the clinic during office hours   should I have any questions or need assistance in obtaining follow up care.    __________________________________________  _____________  __________ Signature of Patient or Authorized Representative            Date                   Time    __________________________________________ Nurse's Signature

## 2012-08-12 ENCOUNTER — Ambulatory Visit: Payer: BC Managed Care – PPO | Attending: Oncology | Admitting: Physical Therapy

## 2012-08-12 DIAGNOSIS — R5381 Other malaise: Secondary | ICD-10-CM | POA: Insufficient documentation

## 2012-08-12 DIAGNOSIS — IMO0001 Reserved for inherently not codable concepts without codable children: Secondary | ICD-10-CM | POA: Insufficient documentation

## 2012-08-12 DIAGNOSIS — R262 Difficulty in walking, not elsewhere classified: Secondary | ICD-10-CM | POA: Insufficient documentation

## 2012-08-12 LAB — COMPREHENSIVE METABOLIC PANEL
ALT: 17 U/L (ref 0–53)
AST: 18 U/L (ref 0–37)
Alkaline Phosphatase: 52 U/L (ref 39–117)
Calcium: 8.6 mg/dL (ref 8.4–10.5)
Chloride: 109 mEq/L (ref 96–112)
Creatinine, Ser: 1.56 mg/dL — ABNORMAL HIGH (ref 0.50–1.35)

## 2012-08-13 ENCOUNTER — Other Ambulatory Visit (HOSPITAL_BASED_OUTPATIENT_CLINIC_OR_DEPARTMENT_OTHER): Payer: BC Managed Care – PPO | Admitting: Lab

## 2012-08-13 ENCOUNTER — Other Ambulatory Visit: Payer: Self-pay | Admitting: Oncology

## 2012-08-13 ENCOUNTER — Telehealth: Payer: Self-pay | Admitting: Oncology

## 2012-08-13 ENCOUNTER — Ambulatory Visit (HOSPITAL_BASED_OUTPATIENT_CLINIC_OR_DEPARTMENT_OTHER): Payer: BC Managed Care – PPO | Admitting: Oncology

## 2012-08-13 ENCOUNTER — Ambulatory Visit (HOSPITAL_BASED_OUTPATIENT_CLINIC_OR_DEPARTMENT_OTHER): Payer: BC Managed Care – PPO

## 2012-08-13 VITALS — BP 120/71 | HR 89 | Temp 97.8°F | Resp 20 | Ht 68.0 in | Wt 213.2 lb

## 2012-08-13 DIAGNOSIS — C9110 Chronic lymphocytic leukemia of B-cell type not having achieved remission: Secondary | ICD-10-CM

## 2012-08-13 DIAGNOSIS — Z9489 Other transplanted organ and tissue status: Secondary | ICD-10-CM

## 2012-08-13 DIAGNOSIS — D89811 Chronic graft-versus-host disease: Secondary | ICD-10-CM

## 2012-08-13 DIAGNOSIS — B259 Cytomegaloviral disease, unspecified: Secondary | ICD-10-CM

## 2012-08-13 DIAGNOSIS — R894 Abnormal immunological findings in specimens from other organs, systems and tissues: Secondary | ICD-10-CM

## 2012-08-13 DIAGNOSIS — C8599 Non-Hodgkin lymphoma, unspecified, extranodal and solid organ sites: Secondary | ICD-10-CM

## 2012-08-13 DIAGNOSIS — R768 Other specified abnormal immunological findings in serum: Secondary | ICD-10-CM

## 2012-08-13 DIAGNOSIS — R7689 Other specified abnormal immunological findings in serum: Secondary | ICD-10-CM

## 2012-08-13 LAB — CBC WITH DIFFERENTIAL/PLATELET
BASO%: 0.2 % (ref 0.0–2.0)
Eosinophils Absolute: 0 10*3/uL (ref 0.0–0.5)
HCT: 28.9 % — ABNORMAL LOW (ref 38.4–49.9)
LYMPH%: 34.1 % (ref 14.0–49.0)
MCHC: 34.3 g/dL (ref 32.0–36.0)
MONO#: 0.1 10*3/uL (ref 0.1–0.9)
NEUT#: 2.9 10*3/uL (ref 1.5–6.5)
NEUT%: 62.5 % (ref 39.0–75.0)
Platelets: 82 10*3/uL — ABNORMAL LOW (ref 140–400)
RBC: 3.12 10*6/uL — ABNORMAL LOW (ref 4.20–5.82)
WBC: 4.6 10*3/uL (ref 4.0–10.3)
lymph#: 1.6 10*3/uL (ref 0.9–3.3)
nRBC: 0 % (ref 0–0)

## 2012-08-13 MED ORDER — SODIUM CHLORIDE 0.9 % IV SOLN
Freq: Once | INTRAVENOUS | Status: AC
  Administered 2012-08-13: 13:00:00 via INTRAVENOUS
  Filled 2012-08-13: qty 1000

## 2012-08-13 NOTE — Telephone Encounter (Signed)
S/w the pt and he is aware that he will be called with the bone marrow biopsy appt

## 2012-08-13 NOTE — Progress Notes (Signed)
ID: Timothy [name]   DOB: 1946-10-12  MR#: [id number]  YQM#:[id number]  HISTORY OF PRESENT ILLNESS: We have very complete records from Dr. Sydnee Levans in [place], and in summary:  The patient was initially diagnosed in August 2000, with a white cell count of 23,600, but normal hemoglobin and platelets, and no significant symptomatology. Over the next several years his white cell count drifted up, and he eventually developed some symptoms of night sweats in particular, leading to treatment with fludarabine, Cytoxan and rituxan for five cycles given between December 2006 and May 2007.  We have CT scans from June 2006, November 2006 and April 2007, and comparing the November 2006 and April 2007 scans, there was near complete response. He had subsequent therapy in [place] as detailed below, but with decreased response, leading to allogeneic stem-cell transplant at the [facility] 02/24/2012.  INTERVAL HISTORY: Timothy [name] returns with his wife Timothy [name] and his daughter Timothy [name] for followup, status post allogenic stem cell transplant for CLL in March 2013.   REVIEW OF SYSTEMS: He is doing "better" but is getting a little frustrated that he has not yet as strong as he could wish. He admits that he is not working out as regularly as she should, and he has just started with formal physical therapy. He has had no fevers no bleeding and no rash. Bowel movements are normal, once her utmost twice daily, will form, not hard, and normal in color. He has no problems with bladder emptying or dysuria, no hematuria or other urinary concerns. His mouth is not better or worse. It is slightly uncomfortable. He feels the dexamethasone rinses do help. He has developed mild nausea with the oral ganciclovir, but Compazine takes care of that without causing him any side effects he is aware of. He continues to have balance problems and continues to use hiking [place] for balance. He also continues to have pain in his  right ribs. We have evaluated this, which is clearly secondary to mild trauma, and the pain is not pleuritic, not associated with breathing, and really no different than last week, unless it slightly better. A detailed review of systems was otherwise noncontributory.  PAST MEDICAL HISTORY: Past Medical History  Diagnosis Date  . 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 Diverticular disease   . Hyperlipidemia   . Obesity   . Hypertension   . Hiatal hernia   . CMV (cytomegalovirus) antibody positive     pre-transplant, with seroconversion x2 pst-transplant  . Right bundle branch block     pre-transplant  . CKD (chronic kidney disease) stage 2, GFR 60-89 ml/min   . Pancytopenia   . Steroid-induced diabetes   . Atrial fibrillation     post-transplant  . Myopathy   . 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 Leukemia, chronic lymphoid   . 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 Tonsillectomy and adenoidectomy   . Bone marrow transplant     FAMILY HISTORY Family History  Problem Relation Age of Onset  . Cancer Father    The patient's father died from complications of chronic lymphocytic leukemia at the age of 33.  It had been diagnosed seven years before when he was 35.  The patient is enrolled in a familial chronic lymphocytic leukemia study out of the [employer] [facility] [employer].  The patient's mother is 53, alive, unfortunately suffering with dementia, and  he has a brother, 47, who is otherwise in fair health.   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  His wife of >40 years, Timothy [name], is a homemaker.  Their daughter, Timothy [name], lives in [place].  She also is a Futures trader.  The patient has an 40 year old grandson and an 41-year-old granddaughter, and  that is really the main reason is moved to this area.  He is a International aid/development worker.     ADVANCED DIRECTIVES:  HEALTH MAINTENANCE: History  Substance Use Topics  . Smoking status: Never Smoker   . Smokeless tobacco: Never Used  . Alcohol Use:      Colonoscopy:  PAP:  Bone density:  Lipid panel:  Allergies  Allergen Reactions  . Benadryl (Diphenhydramine Hcl)     "Restless leg syndrome"    Current Outpatient Prescriptions  Medication Sig Dispense Refill  . Alum & Mag Hydroxide-Simeth (MAGIC MOUTHWASH W/LIDOCAINE) SOLN Take 5 mLs by mouth 4 (four) times daily as needed.      Marland Kitchen amLODipine (NORVASC) 5 MG tablet Take 5 mg by mouth daily.        . Beclomethasone Dipropionate POWD In corn oil emulsion to equal 2mg  per ml  On tapered dose Take with food      . budesonide (ENTOCORT EC) 3 MG 24 hr capsule       . Calcium Citrate (CITRACAL PO) Take 1,200 mg by mouth 2 (two) times daily.      . cholecalciferol (VITAMIN D) 1000 UNITS tablet Take 1,000 Units by mouth daily.      Marland Kitchen dexamethasone 0.5 MG/5ML elixir Take 10 mLs (1 mg total) by mouth 4 (four) times daily. Rinse with 5-10 mL for 4 to 5 minutes 4-6 times daily. Use as a swish and spit rinse.  480 mL  0  . labetalol (NORMODYNE) 200 MG tablet Take 200 mg by mouth 4 (four) times daily.      Marland Kitchen levofloxacin (LEVAQUIN) 750 MG tablet Take 1 tablet (750 mg total) by mouth daily.  30 tablet  12  . lisinopril (PRINIVIL,ZESTRIL) 10 MG tablet Take 1 tablet (10 mg total) by mouth daily.  90 tablet  11  . LORazepam (ATIVAN) 0.5 MG tablet Take 1 tablet (0.5 mg total) by mouth every 6 (six) hours as needed for anxiety.  120 tablet  2  . Multiple Vitamin (MULTIVITAMIN) tablet Take 1 tablet by mouth daily.      . NON FORMULARY Heparin 100 units daily and saline 10 mg daily      . omega-3 acid ethyl esters (LOVAZA) 1 G capsule Take 1 g by mouth 2 (two) times daily.      Marland Kitchen oxyCODONE (ROXICODONE) 5 MG immediate release tablet 1-2 tab PO q 6 Hr PRN pain  30  tablet  0  . PRILOSEC OTC 20 MG tablet       . sertraline (ZOLOFT) 50 MG tablet       . SF 1.1 % GEL dental gel       . sulfamethoxazole-trimethoprim (BACTRIM DS) 800-160 MG per tablet       . tacrolimus (PROGRAF) 1 MG capsule Take 5 mg by mouth 2 (two) times daily. 1.5mg       . ursodiol (ACTIGALL) 300 MG capsule Take 2 capsules (600 mg total) by mouth 2 (two) times daily.  360 capsule  12  . VALCYTE 450 MG tablet  Take 450 mg by mouth 2 (two) times daily.        No current facility-administered medications for this visit.   Facility-Administered Medications Ordered in Other Visits  Medication Dose Route Frequency Provider Last Rate Last Dose  . sodium chloride 0.9 % injection 10 mL  10 mL Intracatheter PRN Lowella Dell, MD   10 mL at 08/11/12 1605  . sodium chloride 0.9 % injection 10 mL  10 mL Intravenous PRN Lowella Dell, MD   10 mL at 08/11/12 1606    OBJECTIVE: Middle-aged white male  in no acute distress Filed Vitals:   08/13/12 1038  BP: 120/71  Pulse: 89  Temp: 97.8 F (36.6 C)  Resp: 20     Body mass index is 32.42 kg/(m^2).     ECOG FS: 2 Filed Weights   08/13/12 1038  Weight: 213 lb 3.2 oz (96.707 kg)    Sclerae anicteric. Oropharynx shows no thrush, and I do not see any ulceration or other lesions. No cervical or supraclavicular adenopathy Lungs  clear to auscultation bilaterally, fair excursion bilaterally Heart regular rate and rhythm, no murmur appreciated Abd soft, nontender, positive bowel sounds, no organomegaly MSK no focal spinal tenderness, minimal nonpitting ankle edema, equal bilaterally,  No significant limits in ROM, although movement in general is somewhat limited by patient's discomfort. Neuro: nonfocal, appropriate affect, alert and oriented x3; strength is 5 minus over 5 upper limbs, 5 minus over 5 lower limbs, including dorsiflexion. Skin: no rash  LAB RESULTS: Lab Results  Component Value Date   WBC 4.6 08/13/2012   NEUTROABS 2.9  08/13/2012   HGB 9.9* 08/13/2012   HCT 28.9* 08/13/2012   MCV 92.6 08/13/2012   PLT 82* 08/13/2012      Chemistry      Component Value Date/Time   NA 138 08/11/2012 0917   K 4.5 08/11/2012 0917   CL 109 08/11/2012 0917   CO2 21 08/11/2012 0917   BUN 33* 08/11/2012 0917   CREATININE 1.56* 08/11/2012 0917      Component Value Date/Time   CALCIUM 8.6 08/11/2012 0917   ALKPHOS 52 08/11/2012 0917   AST 18 08/11/2012 0917   ALT 17 08/11/2012 0917   BILITOT 0.3 08/11/2012 0917       Lipid Panel     Component Value Date/Time   CHOL 209* 08/06/2012 1510   TRIG 480* 08/06/2012 1510   HDL 23* 08/06/2012 1510   CHOLHDL 9.1 08/06/2012 1510   VLDL NOT CALC 08/06/2012 1510   LDLCALC ** 08/06/2012 1510   Tacrolimus Level  Was 9.7 on 07/28/12 (trough level).  Both on 8/16 and 8/21 level was 6.9 Magnesium was 1.2 on 07/26/2012, and 1.4 on 07/30/2012., up to 1.5 on 8/21,   CMV DNA on 07/14/2012:  <200, then was elevated at 875 on 07/21/12, then >23K 8/14, with oral gancyclovir started 8/16; results from 8/21 pending  On 07/28/2012:  IgG = 474;   IgA = 6;   IgM = <5; received IVIG 8/16  STUDIES: Review of outside studies obtained in [place] included a CT of the head and sinuses with and without contrast 05/24/2012 showing normal brain and clear paranasal sinuses CT of the neck on 05/21/2012 showed no cervical lymphadenopathy or mass with unremarkable submandibular and parotid glands. The thyroid was normal. CT of the chest on the same day showed significant decrease in the large lymph nodes previously noted, with a left internal mammary lymph node measuring 9 mm and a  left axillary lymph node at 10 mm. Largest pretracheal lymph node measured 10 mm. Small hypoattenuating liver lesions were unchanged as compared to prior and his spleen size was stable as well. Bone density may 20 02/11/2012 showed normal T score is brain MRI 03/07/2012 was normal  Dg Chest 2 View  08/04/2012  *RADIOLOGY REPORT*  Clinical Data: Fall  2 days ago with left-sided pain  CHEST - 2 VIEW  Comparison: None.  Findings: Exam demonstrates a segment of catheter tubing overlying the right chest and abdomen which appears to represent a right IJ central venous catheter with tip in the region of the right atrium. Lungs are hypoinflated with mild bibasilar opacification left worse than right which may represent a combination of a small amount of pleural fluid with atelectasis although I could not exclude infection.  There is no evidence of pneumothorax. Cardiomediastinal silhouette is within normal.  There is no definite left rib fracture.  There is mild loss of height of several lower thoracic vertebral bodies.  There are degenerative changes of the spine.  IMPRESSION: Hypoinflated lungs with mild bibasilar opacification which may represent a small amount of pleural fluid with atelectasis, although cannot exclude infection.  Mild loss of vertebral body height of several lower thoracic vertebral bodies.  Right-sided central venous catheter with tip in the region of the right atrium.  Original Report Authenticated By: Elba Barman, M.D.     ASSESSMENT: 66 y.o. [place] man with a history of well-differentiated lymphocytic lymphoma/ chronic lymphoid leukemia initially diagnosed in 2000, not requiring intervention until 2006; with multiple chromosomal abnormalities.  His treatment history is as follows  (1) fludarabine/cyclophosphamide/rituximab x5 completed May 2007.  (2) rituximab for 8 doses October 2010, with partial response  (3) Leustatin and ofatumumab weekly x8 July to September 2011 followed by maintenance ofatumumab maintenance ofatumumab every 2 months, with initial response but rising counts September 2012  (4) status-post unrelated donor stem-cell transplant 02/24/2012 at [facility]     (a) conditioning regimen consisted of fludarabine + TBI at 200 cGy, followed by rituximab x27;      (b) CMV reactivation x2 (patient CMV  positive, donor negative), s/p ganciclovir treatment; 3d reactivation August 2013     (c) GVHD: involving gut and skin, treated with steroids, tacrolimus and MMF     (d) atrial fibrillation: resolved on amiodarone     (e) steroid-induced myopathy: improving     (f) hypomagnesemia     (g) hypogammaglobulinemia: s/p IVIG 07/09/2012     (h) elevated triglycerides (606 on 07/14/2012)   PLAN: ongoing issues include  (1) chronic GVHD prophylaxis: currently has tapered off prednisone and MMF (CellCept); continues on tacrolimus (Prograf), ursodiol and budesonide--- we are checking tacrolimus levels weekly until stable, then will broaden to monthly  (2) CMV reactivation: started on oral gancyclovir 08/16, repeat PCR pending;  (3) hypogammaglobulinemia: received IgG at 400 mg/kg 07/09/2012; continuing replacement 3 times/week with levels weekly  (4) immunocompromise: continue prophylaxis with zovirax, diflucan, levofloxacin and Bactrim  (5)  CGVHD assessment weekly initially, then at broadening intervals, with CBC, CMET, maggnesium weekly, IgG, CMV PCR,  and fasting lipids monthly.  (6) due for restaging studies including CT scand of the chest/ abdomen and pelvis and bone marrow biopsy first week in September  Overall Nael is showing slow but steady improvement. He is tapering off his immunosuppressants successfully without so far exhibiting progressive graft-versus-host disease. I have encouraged him to exercise daily and now that he is going to formal  rehabilitation I think that will be easier for him to do. We will continue to check his CM the BCR weekly for now, and he will drop his ganciclovir dose to 1000 mg twice daily starting tomorrow. He knows to call for any problems that may develop before the next visit  .MAGRINAT,GUSTAV C    08/13/2012

## 2012-08-13 NOTE — Patient Instructions (Signed)
Hypomagnesemia Magnesium is a common ion (mineral) in the body which is needed for metabolism. It is about how the body handles food and other chemical reactions necessary for life. Only about 2% of the magnesium in our body is found in the blood. When this is low, it is called hypomagnesemia. The blood will measure only a tiny amount of the magnesium in our body. When it is low in our blood, it does not mean that the whole body supply is low. The normal serum concentration ranges from 1.8-2.5 mEq/L. When the level gets to be less than 1.0 mEq/L, a number of problems begin to happen.  CAUSES   Receiving intravenous fluids without magnesium replacement.   Loss of magnesium from the bowel by naso-gastric suction.   Loss of magnesium from nausea and vomiting or severe diarrhea. Any of the inflammatory bowel conditions can cause this.   Abuse of alcohol often leads to low serum magnesium.   An inherited form of magnesium loss happens when the kidneys lose magnesium. This is called familial or primary hypomagnesemia.   Some medications such as diuretics also cause the loss of magnesium.  SYMPTOMS  These following problems are worse if the changes in magnesium levels come on suddenly.  Tremor.   Confusion.   Muscle weakness.   Over-sensitive to sights and sounds.   Sensitive reflexes.   Depression.   Muscular fibrillations.   Over-reactivity of the nerves.   Irritability.   Psychosis.   Spasms of the hand muscles.   Tetany (where the muscles go into uncontrollable spasms).  DIAGNOSIS  This condition can be diagnosed by blood tests. TREATMENT   In emergency, magnesium can be given intravenously (by vein).   If the condition is less worrisome, it can be corrected by diet. High levels of magnesium are found in green leafy vegetables, peas, beans and nuts among other things. It can also be given through medications by mouth.   If it is being caused by medications, changes can  be made.   If alcohol is a problem, help is available if there are difficulties giving it up.  Document Released: 09/03/2005 Document Revised: 11/27/2011 Document Reviewed: 07/28/2008 [facility] Patient Information 2012 [place], [place].

## 2012-08-16 ENCOUNTER — Ambulatory Visit (HOSPITAL_BASED_OUTPATIENT_CLINIC_OR_DEPARTMENT_OTHER): Payer: BC Managed Care – PPO

## 2012-08-16 ENCOUNTER — Other Ambulatory Visit: Payer: Self-pay | Admitting: Oncology

## 2012-08-16 ENCOUNTER — Other Ambulatory Visit: Payer: Self-pay | Admitting: *Deleted

## 2012-08-16 ENCOUNTER — Telehealth: Payer: Self-pay | Admitting: Medical Oncology

## 2012-08-16 ENCOUNTER — Encounter: Payer: Self-pay | Admitting: Oncology

## 2012-08-16 VITALS — BP 120/62 | HR 76 | Temp 97.4°F | Resp 16

## 2012-08-16 DIAGNOSIS — D89811 Chronic graft-versus-host disease: Secondary | ICD-10-CM

## 2012-08-16 DIAGNOSIS — C9110 Chronic lymphocytic leukemia of B-cell type not having achieved remission: Secondary | ICD-10-CM

## 2012-08-16 DIAGNOSIS — Z9489 Other transplanted organ and tissue status: Secondary | ICD-10-CM

## 2012-08-16 MED ORDER — SODIUM CHLORIDE 0.9 % IV SOLN
INTRAVENOUS | Status: DC
  Administered 2012-08-16: 13:00:00 via INTRAVENOUS
  Filled 2012-08-16: qty 1000

## 2012-08-16 MED ORDER — SODIUM CHLORIDE 0.9 % IJ SOLN
10.0000 mL | INTRAMUSCULAR | Status: DC | PRN
  Administered 2012-08-16: 10 mL via INTRAVENOUS
  Filled 2012-08-16: qty 10

## 2012-08-16 MED ORDER — HEPARIN SOD (PORK) LOCK FLUSH 100 UNIT/ML IV SOLN
500.0000 [IU] | Freq: Once | INTRAVENOUS | Status: AC
  Administered 2012-08-16: 250 [IU] via INTRAVENOUS
  Filled 2012-08-16: qty 5

## 2012-08-16 NOTE — Telephone Encounter (Signed)
Order review 

## 2012-08-16 NOTE — Patient Instructions (Signed)
Hypomagnesemia Magnesium is a common ion (mineral) in the body which is needed for metabolism. It is about how the body handles food and other chemical reactions necessary for life. Only about 2% of the magnesium in our body is found in the blood. When this is low, it is called hypomagnesemia. The blood will measure only a tiny amount of the magnesium in our body. When it is low in our blood, it does not mean that the whole body supply is low. The normal serum concentration ranges from 1.8-2.5 mEq/L. When the level gets to be less than 1.0 mEq/L, a number of problems begin to happen.  CAUSES   Receiving intravenous fluids without magnesium replacement.   Loss of magnesium from the bowel by naso-gastric suction.   Loss of magnesium from nausea and vomiting or severe diarrhea. Any of the inflammatory bowel conditions can cause this.   Abuse of alcohol often leads to low serum magnesium.   An inherited form of magnesium loss happens when the kidneys lose magnesium. This is called familial or primary hypomagnesemia.   Some medications such as diuretics also cause the loss of magnesium.  SYMPTOMS  These following problems are worse if the changes in magnesium levels come on suddenly.  Tremor.   Confusion.   Muscle weakness.   Over-sensitive to sights and sounds.   Sensitive reflexes.   Depression.   Muscular fibrillations.   Over-reactivity of the nerves.   Irritability.   Psychosis.   Spasms of the hand muscles.   Tetany (where the muscles go into uncontrollable spasms).  DIAGNOSIS  This condition can be diagnosed by blood tests. TREATMENT   In emergency, magnesium can be given intravenously (by vein).   If the condition is less worrisome, it can be corrected by diet. High levels of magnesium are found in green leafy vegetables, peas, beans and nuts among other things. It can also be given through medications by mouth.   If it is being caused by medications, changes can  be made.   If alcohol is a problem, help is available if there are difficulties giving it up.  Document Released: 09/03/2005 Document Revised: 11/27/2011 Document Reviewed: 07/28/2008 Exit[employer] Patient Information 2012 [employer]. 

## 2012-08-17 ENCOUNTER — Encounter: Payer: Self-pay | Admitting: Physical Therapy

## 2012-08-18 ENCOUNTER — Ambulatory Visit (HOSPITAL_BASED_OUTPATIENT_CLINIC_OR_DEPARTMENT_OTHER): Payer: BC Managed Care – PPO

## 2012-08-18 ENCOUNTER — Other Ambulatory Visit (HOSPITAL_BASED_OUTPATIENT_CLINIC_OR_DEPARTMENT_OTHER): Payer: BC Managed Care – PPO | Admitting: Lab

## 2012-08-18 VITALS — BP 129/85 | HR 76 | Temp 98.7°F | Resp 18

## 2012-08-18 DIAGNOSIS — D89811 Chronic graft-versus-host disease: Secondary | ICD-10-CM

## 2012-08-18 DIAGNOSIS — Z9489 Other transplanted organ and tissue status: Secondary | ICD-10-CM

## 2012-08-18 DIAGNOSIS — B259 Cytomegaloviral disease, unspecified: Secondary | ICD-10-CM

## 2012-08-18 DIAGNOSIS — C9110 Chronic lymphocytic leukemia of B-cell type not having achieved remission: Secondary | ICD-10-CM

## 2012-08-18 LAB — LIPID PANEL
LDL Cholesterol: 114 mg/dL — ABNORMAL HIGH (ref 0–99)
Total CHOL/HDL Ratio: 10 Ratio
VLDL: 76 mg/dL — ABNORMAL HIGH (ref 0–40)

## 2012-08-18 LAB — CBC WITH DIFFERENTIAL/PLATELET
BASO%: 1.1 % (ref 0.0–2.0)
HCT: 30.6 % — ABNORMAL LOW (ref 38.4–49.9)
LYMPH%: 25.3 % (ref 14.0–49.0)
MCH: 33.1 pg (ref 27.2–33.4)
MCHC: 34.6 g/dL (ref 32.0–36.0)
MCV: 95.8 fL (ref 79.3–98.0)
MONO%: 3.4 % (ref 0.0–14.0)
NEUT%: 68.5 % (ref 39.0–75.0)
Platelets: 116 10*3/uL — ABNORMAL LOW (ref 140–400)
RBC: 3.2 10*6/uL — ABNORMAL LOW (ref 4.20–5.82)

## 2012-08-18 LAB — IGG, IGA, IGM: IgG (Immunoglobin G), Serum: 663 mg/dL (ref 650–1600)

## 2012-08-18 MED ORDER — SODIUM CHLORIDE 0.9 % IV SOLN
INTRAVENOUS | Status: DC
  Administered 2012-08-18: 16:00:00 via INTRAVENOUS
  Filled 2012-08-18: qty 1000

## 2012-08-18 NOTE — Patient Instructions (Addendum)
[facility] Discharge Instructions for Patients Receiving Chemotherapy  Today you received the following Magnesium Sulfate  To help prevent nausea and vomiting after your treatment, we encourage you to take your nausea medication Begin taking it at 7 pm and take it as often as prescribed for the next 24 to 72 hours.   BELOW ARE SYMPTOMS THAT SHOULD BE REPORTED IMMEDIATELY:  *FEVER GREATER THAN 100.5 F  *CHILLS WITH OR WITHOUT FEVER  NAUSEA AND VOMITING THAT IS NOT CONTROLLED WITH YOUR NAUSEA MEDICATION  *UNUSUAL SHORTNESS OF BREATH  *UNUSUAL BRUISING OR BLEEDING  TENDERNESS IN MOUTH AND THROAT WITH OR WITHOUT PRESENCE OF ULCERS  *URINARY PROBLEMS  *BOWEL PROBLEMS  UNUSUAL RASH Items with * indicate a potential emergency and should be followed up as soon as possible.  One of the nurses will contact you 24 hours after your treatment. Please let the nurse know about any problems that you may have experienced. Feel free to call the clinic you have any questions or concerns. The clinic phone number is [phone].   I have been informed and understand all the instructions given to me. I know to contact the clinic, my physician, or go to the Emergency Department if any problems should occur. I do not have any questions at this time, but understand that I may call the clinic during office hours   should I have any questions or need assistance in obtaining follow up care.    __________________________________________  _____________  __________ Signature of Patient or Authorized Representative            Date                   Time    __________________________________________ Nurse's Signature

## 2012-08-19 ENCOUNTER — Telehealth: Payer: Self-pay | Admitting: *Deleted

## 2012-08-19 ENCOUNTER — Encounter: Payer: Self-pay | Admitting: Physical Therapy

## 2012-08-19 ENCOUNTER — Encounter: Payer: Self-pay | Admitting: Physician Assistant

## 2012-08-19 ENCOUNTER — Other Ambulatory Visit (HOSPITAL_BASED_OUTPATIENT_CLINIC_OR_DEPARTMENT_OTHER): Payer: BC Managed Care – PPO | Admitting: Lab

## 2012-08-19 ENCOUNTER — Ambulatory Visit (HOSPITAL_BASED_OUTPATIENT_CLINIC_OR_DEPARTMENT_OTHER): Payer: BC Managed Care – PPO | Admitting: Physician Assistant

## 2012-08-19 ENCOUNTER — Ambulatory Visit (HOSPITAL_BASED_OUTPATIENT_CLINIC_OR_DEPARTMENT_OTHER): Payer: BC Managed Care – PPO

## 2012-08-19 VITALS — BP 115/72 | HR 86 | Temp 98.5°F | Resp 20 | Ht 68.0 in | Wt 210.1 lb

## 2012-08-19 DIAGNOSIS — D61818 Other pancytopenia: Secondary | ICD-10-CM

## 2012-08-19 DIAGNOSIS — D89811 Chronic graft-versus-host disease: Secondary | ICD-10-CM

## 2012-08-19 DIAGNOSIS — C9110 Chronic lymphocytic leukemia of B-cell type not having achieved remission: Secondary | ICD-10-CM

## 2012-08-19 DIAGNOSIS — Z9489 Other transplanted organ and tissue status: Secondary | ICD-10-CM

## 2012-08-19 DIAGNOSIS — R894 Abnormal immunological findings in specimens from other organs, systems and tissues: Secondary | ICD-10-CM

## 2012-08-19 DIAGNOSIS — R768 Other specified abnormal immunological findings in serum: Secondary | ICD-10-CM

## 2012-08-19 DIAGNOSIS — D801 Nonfamilial hypogammaglobulinemia: Secondary | ICD-10-CM

## 2012-08-19 LAB — COMPREHENSIVE METABOLIC PANEL (CC13)
AST: 17 U/L (ref 5–34)
Albumin: 3 g/dL — ABNORMAL LOW (ref 3.5–5.0)
Alkaline Phosphatase: 90 U/L (ref 40–150)
BUN: 18 mg/dL (ref 7.0–26.0)
Chloride: 108 mEq/L — ABNORMAL HIGH (ref 98–107)
Glucose: 85 mg/dl (ref 70–99)
Potassium: 4.2 mEq/L (ref 3.5–5.1)
Sodium: 136 mEq/L (ref 136–145)
Total Protein: 5.2 g/dL — ABNORMAL LOW (ref 6.4–8.3)

## 2012-08-19 LAB — CBC WITH DIFFERENTIAL/PLATELET
BASO%: 0.6 % (ref 0.0–2.0)
Basophils Absolute: 0 10*3/uL (ref 0.0–0.1)
EOS%: 2.3 % (ref 0.0–7.0)
Eosinophils Absolute: 0 10*3/uL (ref 0.0–0.5)
HCT: 29.2 % — ABNORMAL LOW (ref 38.4–49.9)
HGB: 10.1 g/dL — ABNORMAL LOW (ref 13.0–17.1)
LYMPH%: 30.7 % (ref 14.0–49.0)
MCH: 31.9 pg (ref 27.2–33.4)
MCHC: 34.6 g/dL (ref 32.0–36.0)
MCV: 92.1 fL (ref 79.3–98.0)
MONO#: 0.1 10*3/uL (ref 0.1–0.9)
MONO%: 6.3 % (ref 0.0–14.0)
NEUT#: 1.1 10*3/uL — ABNORMAL LOW (ref 1.5–6.5)
NEUT%: 60.1 % (ref 39.0–75.0)
Platelets: 91 10*3/uL — ABNORMAL LOW (ref 140–400)
RBC: 3.17 10*6/uL — ABNORMAL LOW (ref 4.20–5.82)
RDW: 16.4 % — ABNORMAL HIGH (ref 11.0–14.6)
WBC: 1.8 10*3/uL — ABNORMAL LOW (ref 4.0–10.3)
lymph#: 0.5 10*3/uL — ABNORMAL LOW (ref 0.9–3.3)

## 2012-08-19 LAB — CORRECTED CALCIUM (CC13): Calcium, Corrected: 9.4 mg/dL (ref 8.4–10.4)

## 2012-08-19 MED ORDER — SODIUM CHLORIDE 0.9 % IV SOLN
INTRAVENOUS | Status: DC
  Administered 2012-08-19: 16:00:00 via INTRAVENOUS
  Filled 2012-08-19: qty 1000

## 2012-08-19 NOTE — Patient Instructions (Addendum)
[facility] Discharge Instructions for Patients Receiving Chemotherapy  Today you received the following Magnesium Sulfate.  To help prevent nausea and vomiting after your treatment, we encourage you to take your nausea medication as prescribed.     If you develop nausea and vomiting that is not controlled by your nausea medication, call the clinic. If it is after clinic hours your family physician or the after hours number for the clinic or go to the Emergency Department.   BELOW ARE SYMPTOMS THAT SHOULD BE REPORTED IMMEDIATELY:  *FEVER GREATER THAN 100.5 F  *CHILLS WITH OR WITHOUT FEVER  NAUSEA AND VOMITING THAT IS NOT CONTROLLED WITH YOUR NAUSEA MEDICATION  *UNUSUAL SHORTNESS OF BREATH  *UNUSUAL BRUISING OR BLEEDING  TENDERNESS IN MOUTH AND THROAT WITH OR WITHOUT PRESENCE OF ULCERS  *URINARY PROBLEMS  *BOWEL PROBLEMS  UNUSUAL RASH Items with * indicate a potential emergency and should be followed up as soon as possible.  One of the nurses will contact you 24 hours after your treatment. Please let the nurse know about any problems that you may have experienced. Feel free to call the clinic you have any questions or concerns. The clinic phone number is [phone].   I have been informed and understand all the instructions given to me. I know to contact the clinic, my physician, or go to the Emergency Department if any problems should occur. I do not have any questions at this time, but understand that I may call the clinic during office hours   should I have any questions or need assistance in obtaining follow up care.    __________________________________________  _____________  __________ Signature of Patient or Authorized Representative            Date                   Time    __________________________________________ Nurse's Signature

## 2012-08-19 NOTE — Progress Notes (Signed)
ID: [name]   DOB: 02-12-1946  MR#: [id number]  WUJ#:[id number]  HISTORY OF PRESENT ILLNESS: We have very complete records from Dr. Sydnee Levans in [place], and in summary:  The patient was initially diagnosed in August 2000, with a white cell count of 23,600, but normal hemoglobin and platelets, and no significant symptomatology. Over the next several years his white cell count drifted up, and he eventually developed some symptoms of night sweats in particular, leading to treatment with fludarabine, Cytoxan and rituxan for five cycles given between December 2006 and May 2007.  We have CT scans from June 2006, November 2006 and April 2007, and comparing the November 2006 and April 2007 scans, there was near complete response. He had subsequent therapy in [place] as detailed below, but with decreased response, leading to allogeneic stem-cell transplant at the [facility] 02/24/2012.  INTERVAL HISTORY: Rodolphe returns with his wife [name]  for followup, status post allogenic stem cell transplant for CLL in March 2013. Interval history is generally unremarkable. He continues to receive supportive IV fluids and IV magnesium in our office 3 times weekly, due for both today. He also continues on ganciclovir which she is tolerating reasonably well. We are beginning to see, however, some decrease in his counts, including his neutrophils. Fortunately, Zackrey denies any fevers, chills, or night sweats.  REVIEW OF SYSTEMS: Corydon is still weak and tired, although he is very slowly gaining some of his energy, and has been able to walk a little more this past week. He is initiating physical therapy as well which will be very helpful. He notes no skin changes and denies any signs of abnormal bleeding. He has a small mouth ulcer, he tells me, that is stable. He is noted no change in vision and no dry eye symptoms. He is having some occasional nausea and takes his antinausea medications  appropriately. Fortunately he has had no emesis and denies any change in bowel habits. Specifically, he has had neither diarrhea nor constipation. She's had no change in urinary habits. He has shortness of breath with exertion and notes decreased stamina. He continues to have some pain in his right ribs which is very slowly improving. He denies any abnormal headaches or dizziness. He's had no additional myalgias or arthralgias to speak of. He continues to have a small amount of swelling in the feet and ankles which is stable.  A detailed review of systems is otherwise stable and noncontributory.   PAST MEDICAL HISTORY: Past Medical History  Diagnosis Date  . 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 Diverticular disease   . Hyperlipidemia   . Obesity   . Hypertension   . Hiatal hernia   . CMV (cytomegalovirus) antibody positive     pre-transplant, with seroconversion x2 pst-transplant  . Right bundle branch block     pre-transplant  . CKD (chronic kidney disease) stage 2, GFR 60-89 ml/min   . Pancytopenia   . Steroid-induced diabetes   . Atrial fibrillation     post-transplant  . Myopathy   . 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 Leukemia, chronic lymphoid   . 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 Tonsillectomy and adenoidectomy   . Bone marrow transplant     FAMILY HISTORY Family History  Problem Relation Age of Onset  . Cancer Father    The patient's father died from complications  of chronic lymphocytic leukemia at the age of 51.  It had been diagnosed seven years before when he was 39.  The patient is enrolled in a familial chronic lymphocytic leukemia study out of the [facility].  The patient's mother is 64, alive, unfortunately suffering with dementia, and he has a brother, 35, who is otherwise in fair health.   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  His wife of >40 years, [name], is a homemaker.  Their daughter, [name], lives in [place].  She also is a Futures trader.  The patient has an 57 year old grandson and an 78-year-old granddaughter, and that is really the main reason is moved to this area.  He is a International aid/development worker.     ADVANCED DIRECTIVES:  HEALTH MAINTENANCE: History  Substance Use Topics  . Smoking status: Never Smoker   . Smokeless tobacco: Never Used  . Alcohol Use:      Colonoscopy:  PAP:  Bone density:  Lipid panel:  Allergies  Allergen Reactions  . Benadryl (Diphenhydramine Hcl)     "Restless leg syndrome"    Current Outpatient Prescriptions  Medication Sig Dispense Refill  . Alum & Mag Hydroxide-Simeth (MAGIC MOUTHWASH W/LIDOCAINE) SOLN Take 5 mLs by mouth 4 (four) times daily as needed.      Marland Kitchen amLODipine (NORVASC) 5 MG tablet Take 5 mg by mouth daily.        . Beclomethasone Dipropionate POWD In corn oil emulsion to equal 2mg  per ml  On tapered dose Take with food      . budesonide (ENTOCORT EC) 3 MG 24 hr capsule       . Calcium Citrate (CITRACAL PO) Take 1,200 mg by mouth 2 (two) times daily.      . cholecalciferol (VITAMIN D) 1000 UNITS tablet Take 1,000 Units by mouth daily.      Marland Kitchen dexamethasone 0.5 MG/5ML elixir Take 10 mLs (1 mg total) by mouth 4 (four) times daily. Rinse with 5-10 mL for 4 to 5 minutes 4-6 times daily. Use as a swish and spit rinse.  480 mL  0  . GANCICLOVIR PO Take 1,000 mg by mouth 2 (two) times daily.      Marland Kitchen labetalol (NORMODYNE) 200 MG tablet Take 200 mg by mouth 4 (four) times daily.      Marland Kitchen levofloxacin (LEVAQUIN) 750 MG tablet Take 1 tablet (750 mg total) by mouth daily.  30 tablet  12  . lisinopril (PRINIVIL,ZESTRIL) 10 MG tablet Take 1 tablet (10 mg total) by mouth daily.  90 tablet  11  . LORazepam (ATIVAN) 0.5 MG tablet Take 1 tablet (0.5  mg total) by mouth every 6 (six) hours as needed for anxiety.  120 tablet  2  . Multiple Vitamin (MULTIVITAMIN) tablet Take 1 tablet by mouth daily.      . NON FORMULARY Heparin 100 units daily and saline 10 mg daily      . omega-3 acid ethyl esters (LOVAZA) 1 G capsule Take 1 g by mouth 2 (two) times daily.      . ondansetron (ZOFRAN-ODT) 8 MG disintegrating tablet       . oxyCODONE (ROXICODONE) 5 MG immediate release tablet 1-2 tab PO q 6 Hr PRN pain  30 tablet  0  . PRILOSEC OTC 20 MG tablet       . sertraline (ZOLOFT) 50 MG tablet       .  SF 1.1 % GEL dental gel       . sulfamethoxazole-trimethoprim (BACTRIM DS) 800-160 MG per tablet       . tacrolimus (PROGRAF) 1 MG capsule Take 5 mg by mouth 2 (two) times daily. 1.5mg       . ursodiol (ACTIGALL) 300 MG capsule Take 2 capsules (600 mg total) by mouth 2 (two) times daily.  360 capsule  12  . VALCYTE 450 MG tablet Take 450 mg by mouth 2 (two) times daily.        No current facility-administered medications for this visit.   Facility-Administered Medications Ordered in Other Visits  Medication Dose Route Frequency Provider Last Rate Last Dose  . sodium chloride 0.9 % 1,000 mL with magnesium sulfate 1 g infusion   Intravenous Continuous Catalina Gravel, PA 500 mL/hr at 08/19/12 1537    . sodium chloride 0.9 % injection 10 mL  10 mL Intracatheter PRN Lowella Dell, MD   10 mL at 08/11/12 1605  . sodium chloride 0.9 % injection 10 mL  10 mL Intravenous PRN Lowella Dell, MD   10 mL at 08/11/12 1606  . DISCONTD: sodium chloride 0.9 % 1,000 mL with magnesium sulfate 1 g infusion   Intravenous Continuous Shuree Brossart Allegra Grana, PA        OBJECTIVE: Middle-aged white male  in no acute distress Filed Vitals:   08/19/12 1436  BP: 115/72  Pulse: 86  Temp: 98.5 F (36.9 C)  Resp: 20     Body mass index is 31.95 kg/(m^2).     ECOG FS: 1 Filed Weights   08/19/12 1436  Weight: 210 lb 1.6 oz (95.301 kg)    Sclerae anicteric. Oropharynx shows no  thrush, and no other lesions are visible No cervical or supraclavicular adenopathy Lungs  clear to auscultation bilaterally, fair excursion bilaterally Heart regular rate and rhythm, Abd soft, nontender, positive bowel sounds, no organomegaly MSK no focal spinal tenderness, minimal nonpitting ankle edema, equal bilaterally,  No significant limits in ROM, Neuro: nonfocal, appropriate affect, alert and oriented x3; strength is 5 minus over 5 upper limbs, 5 minus over 5 lower limbs, including dorsiflexion. Slight tremor in upper extremities. Skin: no rash  LAB RESULTS: Lab Results  Component Value Date   WBC 1.8* 08/19/2012   NEUTROABS 1.1* 08/19/2012   HGB 10.1* 08/19/2012   HCT 29.2* 08/19/2012   MCV 92.1 08/19/2012   PLT 91* 08/19/2012      Chemistry      Component Value Date/Time   NA 136 08/19/2012 1420   NA 138 08/11/2012 0917   K 4.2 08/19/2012 1420   K 4.5 08/11/2012 0917   CL 108* 08/19/2012 1420   CL 109 08/11/2012 0917   CO2 20* 08/19/2012 1420   CO2 21 08/11/2012 0917   BUN 18.0 08/19/2012 1420   BUN 33* 08/11/2012 0917   CREATININE 1.3 08/19/2012 1420   CREATININE 1.56* 08/11/2012 0917      Component Value Date/Time   CALCIUM 8.6 08/11/2012 0917   ALKPHOS 90 08/19/2012 1420   ALKPHOS 52 08/11/2012 0917   AST 17 08/19/2012 1420   AST 18 08/11/2012 0917   ALT 11 08/19/2012 1420   ALT 17 08/11/2012 0917   BILITOT 0.50 08/19/2012 1420   BILITOT 0.3 08/11/2012 0917       Lipid Panel     Component Value Date/Time   CHOL 211* 08/18/2012 1429   TRIG 379* 08/18/2012 1429   HDL 21* 08/18/2012 1429  CHOLHDL 10.0 08/18/2012 1429   VLDL 76* 08/18/2012 1429   LDLCALC 114* 08/18/2012 1429   Tacrolimus Level  Was 9.7 on 07/28/12 (trough level).  Both on 8/16 and 8/21 level was 6.9 Magnesium was 1.7 on 08/18/2012, up from 1.4 on 08/09/2012.   CMV DNA on 07/14/2012:  <200, then was elevated at 875 on 07/21/12, then >23K 8/14, with oral gancyclovir started 8/16; results from 8/21 pending  On  08/18/2012:  IgG = 663;   IgA < 6;   IgM  <5; received IVIG 8/16  STUDIES: Review of outside studies obtained in [place] included a CT of the head and sinuses with and without contrast 05/24/2012 showing normal brain and clear paranasal sinuses CT of the neck on 05/21/2012 showed no cervical lymphadenopathy or mass with unremarkable submandibular and parotid glands. The thyroid was normal. CT of the chest on the same day showed significant decrease in the large lymph nodes previously noted, with a left internal mammary lymph node measuring 9 mm and a left axillary lymph node at 10 mm. Largest pretracheal lymph node measured 10 mm. Small hypoattenuating liver lesions were unchanged as compared to prior and his spleen size was stable as well. Bone density may 20 02/11/2012 showed normal T score is brain MRI 03/07/2012 was normal     ASSESSMENT: 66 y.o. [place] man with a history of well-differentiated lymphocytic lymphoma/ chronic lymphoid leukemia initially diagnosed in 2000, not requiring intervention until 2006; with multiple chromosomal abnormalities.  His treatment history is as follows  (1) fludarabine/cyclophosphamide/rituximab x5 completed May 2007.  (2) rituximab for 8 doses October 2010, with partial response  (3) Leustatin and ofatumumab weekly x8 July to September 2011 followed by maintenance ofatumumab maintenance ofatumumab every 2 months, with initial response but rising counts September 2012  (4) status-post unrelated donor stem-cell transplant 02/24/2012 at the [facility]     (a) conditioning regimen consisted of fludarabine + TBI at 200 cGy, followed by rituximab x27;      (b) CMV reactivation x2 (patient CMV positive, donor negative), s/p ganciclovir treatment; 3d reactivation August 2013     (c) GVHD: involving gut and skin, treated with steroids, tacrolimus and MMF     (d) atrial fibrillation: resolved on amiodarone     (e) steroid-induced myopathy: improving     (f)  hypomagnesemia     (g) hypogammaglobulinemia: s/p IVIG 07/09/2012     (h) elevated triglycerides (606 on 07/14/2012)   PLAN: ongoing issues include  (1) chronic GVHD prophylaxis: currently has tapered off prednisone and MMF (CellCept); continues on tacrolimus (Prograf), ursodiol and budesonide--- we are checking tacrolimus levels weekly until stable, then will broaden to monthly  (2) CMV reactivation: started on oral gancyclovir 08/16, repeat PCR pending;  (3) hypogammaglobulinemia: received IgG at 663 mg/kg 08/18/2012;   (4)   Hypomagnesemia:  Continuing with IV replacement 3 times weekly  (5) immunocompromise: continue prophylaxis with zovirax, diflucan, levofloxacin and Bactrim;  Consider Neupogen if ANC < or = 1.0  (6)  CGVHD assessment weekly initially, then at broadening intervals, with CBC, CMET, maggnesium weekly, IgG, CMV PCR,  and fasting lipids monthly.  (7) due for restaging studies including CT scand of the chest/ abdomen and pelvis and bone marrow biopsy first week in September  Overall Jassiah appears to be stable, and is slowly improving.  She'll continue ongoing cycle of your 1000 mg by mouth twice a day. We will continue to have him come in 3 times weekly for supportive IV fluids with  IV magnesium replacement. We're also following his neutrophil count very closely. Per protocol, we will consider initiating Neupogen injections if and when his ANC is less than or equal to 1.0. Accordingly, he will have his CBC repeated tomorrow before the holiday weekend. He is coming in on Saturday, August 31, for IV fluids and magnesium. He'll return on Tuesday, September 3, for labs and IV infusion as well.  He is already scheduled for his restaging studies next week, and I will see him for reevaluation on Friday, September 6. Aurther Loft and [place] both voice their understanding and agreement with our plan, and of course they know to call with any changes or problems.    Zollie Scale     08/19/2012

## 2012-08-19 NOTE — Telephone Encounter (Signed)
Labs only 8/30; lab and 2 Hr infusion 9/3, 9/9, 9/13, 9/16, and 9/18; Add labs at 11:15 on 9/4 (prior to CT); Add lab and AB with infusion on 9/6; lab, AB and 2 hr infusion 9/11; lab, GM (in AM per GM) and 2 hr infusion 9/20  Sent michelle email to get the patient's treatment set up

## 2012-08-20 ENCOUNTER — Encounter: Payer: Self-pay | Admitting: *Deleted

## 2012-08-20 ENCOUNTER — Encounter (HOSPITAL_COMMUNITY): Payer: Self-pay | Admitting: Pharmacy Technician

## 2012-08-20 ENCOUNTER — Other Ambulatory Visit (HOSPITAL_BASED_OUTPATIENT_CLINIC_OR_DEPARTMENT_OTHER): Payer: BC Managed Care – PPO | Admitting: Lab

## 2012-08-20 ENCOUNTER — Telehealth: Payer: Self-pay | Admitting: *Deleted

## 2012-08-20 DIAGNOSIS — Z9489 Other transplanted organ and tissue status: Secondary | ICD-10-CM

## 2012-08-20 DIAGNOSIS — D89811 Chronic graft-versus-host disease: Secondary | ICD-10-CM

## 2012-08-20 DIAGNOSIS — C9110 Chronic lymphocytic leukemia of B-cell type not having achieved remission: Secondary | ICD-10-CM

## 2012-08-20 LAB — CBC WITH DIFFERENTIAL/PLATELET
Basophils Absolute: 0 10*3/uL (ref 0.0–0.1)
HCT: 29.7 % — ABNORMAL LOW (ref 38.4–49.9)
HGB: 10.3 g/dL — ABNORMAL LOW (ref 13.0–17.1)
MONO#: 0.2 10*3/uL (ref 0.1–0.9)
NEUT%: 50.7 % (ref 39.0–75.0)
Platelets: 100 10*3/uL — ABNORMAL LOW (ref 140–400)
WBC: 1.8 10*3/uL — ABNORMAL LOW (ref 4.0–10.3)
lymph#: 0.6 10*3/uL — ABNORMAL LOW (ref 0.9–3.3)

## 2012-08-20 LAB — LIPID PANEL
LDL Cholesterol: 142 mg/dL — ABNORMAL HIGH (ref 0–99)
Triglycerides: 301 mg/dL — ABNORMAL HIGH (ref <150)

## 2012-08-20 NOTE — Progress Notes (Unsigned)
Received call from pt requesting a prior auth # so that he may have zofran refilled. Per pt pharmacy, a prior auth # is needed for dispensing. Call left for ebony and elizabeth for assistance

## 2012-08-20 NOTE — Telephone Encounter (Signed)
Per staff message and POF I have scheduled appts.  JMW  

## 2012-08-21 ENCOUNTER — Ambulatory Visit (HOSPITAL_BASED_OUTPATIENT_CLINIC_OR_DEPARTMENT_OTHER): Payer: BC Managed Care – PPO

## 2012-08-21 ENCOUNTER — Other Ambulatory Visit: Payer: Self-pay

## 2012-08-21 VITALS — BP 135/79 | HR 81 | Temp 96.2°F | Resp 20

## 2012-08-21 DIAGNOSIS — C9110 Chronic lymphocytic leukemia of B-cell type not having achieved remission: Secondary | ICD-10-CM

## 2012-08-21 DIAGNOSIS — Z9489 Other transplanted organ and tissue status: Secondary | ICD-10-CM

## 2012-08-21 DIAGNOSIS — D89811 Chronic graft-versus-host disease: Secondary | ICD-10-CM

## 2012-08-21 MED ORDER — SODIUM CHLORIDE 0.9 % IV SOLN
INTRAVENOUS | Status: DC
  Administered 2012-08-21: 12:00:00 via INTRAVENOUS

## 2012-08-21 MED ORDER — SODIUM CHLORIDE 0.9 % IV SOLN: INTRAVENOUS | Status: DC

## 2012-08-21 MED ORDER — FILGRASTIM 480 MCG/0.8ML IJ SOLN
480.0000 ug | Freq: Once | INTRAMUSCULAR | Status: AC
  Administered 2012-08-21: 480 ug via SUBCUTANEOUS
  Filled 2012-08-21: qty 0.8

## 2012-08-21 NOTE — Progress Notes (Signed)
Spoke with Nurse at the [facility] in [place]. About Pt. Receiving Neupogen vs Neulasta.  There is no contraindication to the neulasta, however, they prefer the neupogen.  Pt. Is ok to receive neupogen 480 mcg  Today since ANC 0.9 on 08-20-12.  Wife especially concerned that the anc would be much lower by visit 08-24-12.  Reviewed information with Dr. Cyndie Chime on call.  He gave order for neupogen 480 mcg for today.  Pt. Knows to call if temp 100.5 or greater.

## 2012-08-24 ENCOUNTER — Encounter (HOSPITAL_COMMUNITY): Payer: Self-pay | Admitting: *Deleted

## 2012-08-24 ENCOUNTER — Other Ambulatory Visit: Payer: Self-pay | Admitting: *Deleted

## 2012-08-24 ENCOUNTER — Inpatient Hospital Stay (HOSPITAL_COMMUNITY)
Admission: AD | Admit: 2012-08-24 | Discharge: 2012-08-26 | DRG: 566 | Disposition: A | Discharge status: Home / Self Care | Payer: BC Managed Care – PPO | Source: Ambulatory Visit | Attending: Oncology | Admitting: Oncology

## 2012-08-24 ENCOUNTER — Other Ambulatory Visit: Payer: Self-pay | Admitting: Radiology

## 2012-08-24 ENCOUNTER — Other Ambulatory Visit (HOSPITAL_BASED_OUTPATIENT_CLINIC_OR_DEPARTMENT_OTHER): Payer: BC Managed Care – PPO

## 2012-08-24 ENCOUNTER — Encounter: Payer: Self-pay | Admitting: Oncology

## 2012-08-24 ENCOUNTER — Encounter: Payer: Self-pay | Admitting: *Deleted

## 2012-08-24 DIAGNOSIS — G252 Other specified forms of tremor: Secondary | ICD-10-CM

## 2012-08-24 DIAGNOSIS — E860 Dehydration: Principal | ICD-10-CM

## 2012-08-24 DIAGNOSIS — D61818 Other pancytopenia: Secondary | ICD-10-CM | POA: Diagnosis present

## 2012-08-24 DIAGNOSIS — G729 Myopathy, unspecified: Secondary | ICD-10-CM

## 2012-08-24 DIAGNOSIS — I129 Hypertensive chronic kidney disease with stage 1 through stage 4 chronic kidney disease, or unspecified chronic kidney disease: Secondary | ICD-10-CM | POA: Diagnosis present

## 2012-08-24 DIAGNOSIS — T380X5A Adverse effect of glucocorticoids and synthetic analogues, initial encounter: Secondary | ICD-10-CM | POA: Diagnosis present

## 2012-08-24 DIAGNOSIS — Z9489 Other transplanted organ and tissue status: Secondary | ICD-10-CM

## 2012-08-24 DIAGNOSIS — E46 Unspecified protein-calorie malnutrition: Secondary | ICD-10-CM | POA: Diagnosis present

## 2012-08-24 DIAGNOSIS — B259 Cytomegaloviral disease, unspecified: Secondary | ICD-10-CM | POA: Diagnosis present

## 2012-08-24 DIAGNOSIS — K449 Diaphragmatic hernia without obstruction or gangrene: Secondary | ICD-10-CM

## 2012-08-24 DIAGNOSIS — R768 Other specified abnormal immunological findings in serum: Secondary | ICD-10-CM

## 2012-08-24 DIAGNOSIS — Z6831 Body mass index (BMI) 31.0-31.9, adult: Secondary | ICD-10-CM

## 2012-08-24 DIAGNOSIS — G722 Myopathy due to other toxic agents: Secondary | ICD-10-CM | POA: Diagnosis present

## 2012-08-24 DIAGNOSIS — C9110 Chronic lymphocytic leukemia of B-cell type not having achieved remission: Secondary | ICD-10-CM | POA: Diagnosis present

## 2012-08-24 DIAGNOSIS — I4891 Unspecified atrial fibrillation: Secondary | ICD-10-CM | POA: Diagnosis present

## 2012-08-24 DIAGNOSIS — E785 Hyperlipidemia, unspecified: Secondary | ICD-10-CM | POA: Diagnosis present

## 2012-08-24 DIAGNOSIS — R894 Abnormal immunological findings in specimens from other organs, systems and tissues: Secondary | ICD-10-CM

## 2012-08-24 DIAGNOSIS — E139 Other specified diabetes mellitus without complications: Secondary | ICD-10-CM | POA: Diagnosis present

## 2012-08-24 DIAGNOSIS — E099 Drug or chemical induced diabetes mellitus without complications: Secondary | ICD-10-CM

## 2012-08-24 DIAGNOSIS — D89811 Chronic graft-versus-host disease: Secondary | ICD-10-CM

## 2012-08-24 DIAGNOSIS — E669 Obesity, unspecified: Secondary | ICD-10-CM | POA: Diagnosis present

## 2012-08-24 DIAGNOSIS — R112 Nausea with vomiting, unspecified: Secondary | ICD-10-CM

## 2012-08-24 DIAGNOSIS — Y849 Medical procedure, unspecified as the cause of abnormal reaction of the patient, or of later complication, without mention of misadventure at the time of the procedure: Secondary | ICD-10-CM | POA: Diagnosis present

## 2012-08-24 DIAGNOSIS — K5790 Diverticulosis of intestine, part unspecified, without perforation or abscess without bleeding: Secondary | ICD-10-CM

## 2012-08-24 DIAGNOSIS — R197 Diarrhea, unspecified: Secondary | ICD-10-CM

## 2012-08-24 DIAGNOSIS — I4510 Unspecified right bundle-branch block: Secondary | ICD-10-CM

## 2012-08-24 DIAGNOSIS — R5381 Other malaise: Secondary | ICD-10-CM | POA: Diagnosis present

## 2012-08-24 DIAGNOSIS — D899 Disorder involving the immune mechanism, unspecified: Secondary | ICD-10-CM | POA: Diagnosis present

## 2012-08-24 DIAGNOSIS — I10 Essential (primary) hypertension: Secondary | ICD-10-CM

## 2012-08-24 DIAGNOSIS — N182 Chronic kidney disease, stage 2 (mild): Secondary | ICD-10-CM

## 2012-08-24 DIAGNOSIS — T8600 Unspecified complication of bone marrow transplant: Secondary | ICD-10-CM | POA: Diagnosis present

## 2012-08-24 DIAGNOSIS — D849 Immunodeficiency, unspecified: Secondary | ICD-10-CM

## 2012-08-24 DIAGNOSIS — D709 Neutropenia, unspecified: Secondary | ICD-10-CM

## 2012-08-24 LAB — COMPREHENSIVE METABOLIC PANEL WITH GFR
ALT: 7 U/L (ref 0–53)
AST: 14 U/L (ref 0–37)
Albumin: 3 g/dL — ABNORMAL LOW (ref 3.5–5.2)
Alkaline Phosphatase: 92 U/L (ref 39–117)
BUN: 14 mg/dL (ref 6–23)
CO2: 21 meq/L (ref 19–32)
Calcium: 8.6 mg/dL (ref 8.4–10.5)
Chloride: 104 meq/L (ref 96–112)
Creatinine, Ser: 1.33 mg/dL (ref 0.50–1.35)
GFR calc Af Amer: 63 mL/min — ABNORMAL LOW
GFR calc non Af Amer: 54 mL/min — ABNORMAL LOW
Glucose, Bld: 86 mg/dL (ref 70–99)
Potassium: 3.5 meq/L (ref 3.5–5.1)
Sodium: 137 meq/L (ref 135–145)
Total Bilirubin: 0.2 mg/dL — ABNORMAL LOW (ref 0.3–1.2)
Total Protein: 5.1 g/dL — ABNORMAL LOW (ref 6.0–8.3)

## 2012-08-24 LAB — CBC
HCT: 30 % — ABNORMAL LOW (ref 39.0–52.0)
MCHC: 34 g/dL (ref 30.0–36.0)
MCV: 92.6 fL (ref 78.0–100.0)
Platelets: 148 10*3/uL — ABNORMAL LOW (ref 150–400)
RDW: 16 % — ABNORMAL HIGH (ref 11.5–15.5)
WBC: 2 10*3/uL — ABNORMAL LOW (ref 4.0–10.5)

## 2012-08-24 LAB — CBC WITH DIFFERENTIAL/PLATELET
Basophils Absolute: 0.1 10*3/uL (ref 0.0–0.1)
Eosinophils Absolute: 0 10*3/uL (ref 0.0–0.5)
HCT: 35.4 % — ABNORMAL LOW (ref 38.4–49.9)
HGB: 12.1 g/dL — ABNORMAL LOW (ref 13.0–17.1)
NEUT#: 1 10*3/uL — ABNORMAL LOW (ref 1.5–6.5)
NEUT%: 34.8 % — ABNORMAL LOW (ref 39.0–75.0)
RDW: 16.6 % — ABNORMAL HIGH (ref 11.0–14.6)
lymph#: 1.5 10*3/uL (ref 0.9–3.3)

## 2012-08-24 LAB — MAGNESIUM: Magnesium: 1.4 mg/dL — ABNORMAL LOW (ref 1.5–2.5)

## 2012-08-24 MED ORDER — SODIUM CHLORIDE 0.9 % IV SOLN
INTRAVENOUS | Status: DC
  Administered 2012-08-24 – 2012-08-25 (×4): via INTRAVENOUS
  Filled 2012-08-24 (×11): qty 1000

## 2012-08-24 MED ORDER — SULFAMETHOXAZOLE-TMP DS 800-160 MG PO TABS
1.0000 | ORAL_TABLET | ORAL | Status: DC
  Administered 2012-08-24: 1 via ORAL
  Filled 2012-08-24: qty 1

## 2012-08-24 MED ORDER — SODIUM FLUORIDE 1.1 % DT GEL: 1.0000 "application " | Freq: Every day | DENTAL | Status: DC

## 2012-08-24 MED ORDER — TACROLIMUS 1 MG PO CAPS
1.5000 mg | ORAL_CAPSULE | Freq: Two times a day (BID) | ORAL | Status: DC
  Administered 2012-08-24 – 2012-08-26 (×4): 1.5 mg via ORAL
  Filled 2012-08-24 (×5): qty 1

## 2012-08-24 MED ORDER — BIOTENE DRY MOUTH MT LIQD
15.0000 mL | Freq: Two times a day (BID) | OROMUCOSAL | Status: DC
  Administered 2012-08-25 – 2012-08-26 (×3): 15 mL via OROMUCOSAL

## 2012-08-24 MED ORDER — PROCHLORPERAZINE MALEATE 10 MG PO TABS
10.0000 mg | ORAL_TABLET | Freq: Four times a day (QID) | ORAL | Status: DC | PRN
  Administered 2012-08-25: 10 mg via ORAL
  Filled 2012-08-24: qty 1

## 2012-08-24 MED ORDER — DEXAMETHASONE 0.5 MG/5ML PO ELIX: 1.0000 mg | ORAL_SOLUTION | Freq: Every day | ORAL | Status: DC

## 2012-08-24 MED ORDER — SERTRALINE HCL 100 MG PO TABS
100.0000 mg | ORAL_TABLET | ORAL | Status: DC
  Administered 2012-08-25: 100 mg via ORAL
  Filled 2012-08-24: qty 1

## 2012-08-24 MED ORDER — VITAMIN D3 25 MCG (1000 UNIT) PO TABS
1000.0000 [IU] | ORAL_TABLET | Freq: Every day | ORAL | Status: DC
  Administered 2012-08-25 – 2012-08-26 (×2): 1000 [IU] via ORAL
  Filled 2012-08-24 (×2): qty 1

## 2012-08-24 MED ORDER — SODIUM CHLORIDE 0.9 % IV SOLN: INTRAVENOUS | Status: DC

## 2012-08-24 MED ORDER — ENOXAPARIN SODIUM 40 MG/0.4ML ~~LOC~~ SOLN
40.0000 mg | SUBCUTANEOUS | Status: DC
  Administered 2012-08-25: 40 mg via SUBCUTANEOUS
  Filled 2012-08-24 (×3): qty 0.4

## 2012-08-24 MED ORDER — LORATADINE 10 MG PO TABS
10.0000 mg | ORAL_TABLET | Freq: Every day | ORAL | Status: DC
  Administered 2012-08-26: 10 mg via ORAL
  Filled 2012-08-24 (×2): qty 1

## 2012-08-24 MED ORDER — OMEGA-3-ACID ETHYL ESTERS 1 G PO CAPS
1.0000 g | ORAL_CAPSULE | Freq: Every day | ORAL | Status: DC
  Administered 2012-08-25 – 2012-08-26 (×2): 1 g via ORAL
  Filled 2012-08-24 (×2): qty 1

## 2012-08-24 MED ORDER — FILGRASTIM 480 MCG/0.8ML IJ SOLN
480.0000 ug | Freq: Once | INTRAMUSCULAR | Status: DC
  Filled 2012-08-24: qty 0.8

## 2012-08-24 MED ORDER — MAGNESIUM SULFATE IN D5W 10-5 MG/ML-% IV SOLN
1.0000 g | Freq: Once | INTRAVENOUS | Status: AC
  Administered 2012-08-24: 1 g via INTRAVENOUS
  Filled 2012-08-24: qty 100

## 2012-08-24 MED ORDER — FISH OIL 300 MG PO CAPS: 600.0000 mg | ORAL_CAPSULE | Freq: Every day | ORAL | Status: DC

## 2012-08-24 MED ORDER — MAGNESIUM SULFATE 50 % IJ SOLN: 1.0000 g | Freq: Once | INTRAVENOUS | Status: DC

## 2012-08-24 MED ORDER — MAGIC MOUTHWASH W/LIDOCAINE
5.0000 mL | Freq: Four times a day (QID) | ORAL | Status: DC | PRN
  Filled 2012-08-24: qty 5

## 2012-08-24 MED ORDER — VALGANCICLOVIR HCL 450 MG PO TABS
450.0000 mg | ORAL_TABLET | Freq: Two times a day (BID) | ORAL | Status: DC
  Administered 2012-08-24 – 2012-08-26 (×4): 450 mg via ORAL
  Filled 2012-08-24 (×5): qty 1

## 2012-08-24 MED ORDER — ONDANSETRON 8 MG PO TBDP
8.0000 mg | ORAL_TABLET | Freq: Two times a day (BID) | ORAL | Status: DC
  Administered 2012-08-24 – 2012-08-26 (×4): 8 mg via ORAL
  Filled 2012-08-24 (×5): qty 1

## 2012-08-24 MED ORDER — DEXAMETHASONE 1 MG/ML PO CONC
1.0000 mg | Freq: Every day | ORAL | Status: DC
  Administered 2012-08-25: 1 mg via ORAL
  Filled 2012-08-24 (×2): qty 1

## 2012-08-24 MED ORDER — CENTRUM SILVER ADULT 50+ PO TABS: 1.0000 | ORAL_TABLET | Freq: Every day | ORAL | Status: DC

## 2012-08-24 MED ORDER — LORAZEPAM 0.5 MG PO TABS
0.5000 mg | ORAL_TABLET | Freq: Four times a day (QID) | ORAL | Status: DC | PRN
  Administered 2012-08-25 (×2): 0.5 mg via ORAL
  Filled 2012-08-24 (×2): qty 1

## 2012-08-24 MED ORDER — ADULT MULTIVITAMIN W/MINERALS CH
1.0000 | ORAL_TABLET | Freq: Every day | ORAL | Status: DC
  Administered 2012-08-25 – 2012-08-26 (×2): 1 via ORAL
  Filled 2012-08-24 (×2): qty 1

## 2012-08-24 MED ORDER — OXYCODONE HCL 5 MG PO TABS
5.0000 mg | ORAL_TABLET | Freq: Four times a day (QID) | ORAL | Status: DC | PRN
  Administered 2012-08-24 – 2012-08-26 (×3): 5 mg via ORAL
  Filled 2012-08-24 (×3): qty 1

## 2012-08-24 MED ORDER — OXYCODONE HCL 5 MG PO CAPS: 5.0000 mg | ORAL_CAPSULE | Freq: Four times a day (QID) | ORAL | Status: DC | PRN

## 2012-08-24 MED ORDER — CHOLESTYRAMINE 4 G PO PACK
4.0000 g | PACK | ORAL | Status: DC
  Filled 2012-08-24 (×3): qty 1

## 2012-08-24 MED ORDER — CHLORHEXIDINE GLUCONATE 0.12 % MT SOLN
15.0000 mL | Freq: Two times a day (BID) | OROMUCOSAL | Status: DC
  Administered 2012-08-25 – 2012-08-26 (×3): 15 mL via OROMUCOSAL
  Filled 2012-08-24 (×6): qty 15

## 2012-08-24 MED ORDER — URSODIOL 300 MG PO CAPS
600.0000 mg | ORAL_CAPSULE | Freq: Two times a day (BID) | ORAL | Status: DC
  Administered 2012-08-24 – 2012-08-26 (×4): 600 mg via ORAL
  Filled 2012-08-24 (×6): qty 2

## 2012-08-24 MED ORDER — SERTRALINE HCL 50 MG PO TABS
50.0000 mg | ORAL_TABLET | ORAL | Status: DC
  Administered 2012-08-26: 50 mg via ORAL
  Filled 2012-08-24: qty 1

## 2012-08-24 MED ORDER — LEVOFLOXACIN 750 MG PO TABS
750.0000 mg | ORAL_TABLET | Freq: Every day | ORAL | Status: DC
  Administered 2012-08-24 – 2012-08-26 (×3): 750 mg via ORAL
  Filled 2012-08-24 (×3): qty 1

## 2012-08-24 NOTE — H&P (Signed)
ID: Timothy [name]   DOB: 1946/07/09  MR#: [id number]  CSN#:[id number]  PCP: Kari Baars, MD GYN:  SU:  OTHER MD:   HISTORY OF PRESENT ILLNESS: We have very complete records from Dr. Sydnee Levans in [place], and in summary: The patient was initially diagnosed in August 2000, with a white cell count of 23,600, but normal hemoglobin and platelets, and no significant symptomatology. Over the next several years his white cell count drifted up, and he eventually developed some symptoms of night sweats in particular, leading to treatment with fludarabine, Cytoxan and rituxan for five cycles given between December 2006 and May 2007. We have CT scans from June 2006, November 2006 and April 2007, and comparing the November 2006 and April 2007 scans, there was near complete response. He had subsequent therapy in [place] as detailed below, but with decreased response, leading to allogeneic stem-cell transplant at the [facility] 02/24/2012.  INTERVAL HISTORY: Timothy [name] came to the office today after 2 days with  watery diarrhea. He had an uncontrolled bowel movement in the office, requiring him to be changed to scrub's.  REVIEW OF SYSTEMS: He has had nausea and vomiting for 3 or 4 days. He has not been able to eat anything in that time because everything he 2 says makes him a one to vomit. He has had no unusual headaches, visual changes, or dizziness, although he says her balance has been poor. He denies cough, phlegm production, or pleurisy. There has been no dysuria or hematuria. He is not aware of fever. He has not had any mouth sores that he is aware of. A detailed review of systems today was otherwise noncontributory.  PAST MEDICAL HISTORY: Past Medical History  Diagnosis Date  . 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 Diverticular disease   . Hyperlipidemia   . Obesity   . Hypertension   . Hiatal hernia   . CMV (cytomegalovirus) antibody  positive     pre-transplant, with seroconversion x2 pst-transplant  . Right bundle branch block     pre-transplant  . CKD (chronic kidney disease) stage 2, GFR 60-89 ml/min   . Pancytopenia   . Steroid-induced diabetes   . Atrial fibrillation     post-transplant  . Myopathy   . 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 Leukemia, chronic lymphoid   . 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 Tonsillectomy and adenoidectomy   . Bone marrow transplant     FAMILY HISTORY Family History  Problem Relation Age of Onset  . Cancer Father   The patient's father died from complications of chronic lymphocytic leukemia at the age of 41. It had been diagnosed seven years before when he was 57. The patient is enrolled in a familial chronic lymphocytic leukemia study out of the [employer]. The patient's mother is 30, alive, unfortunately suffering with dementia, and he has a brother, 35, who is otherwise in fair health.   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 His wife of >40 years, [name], is a homemaker. Their daughter, [name], lives in [place]. She also is a Futures trader. The patient has an 74 year old grandson and an 38-year-old granddaughter, and that is really the main reason is moved to this area. He is a International aid/development worker.  ADVANCED DIRECTIVES:  HEALTH MAINTENANCE: History  Substance Use Topics  . Smoking status: Never Smoker   . Smokeless tobacco: Never Used  . Alcohol Use:      Colonoscopy:  PAP:  Bone density:  Lipid panel:  Allergies  Allergen Reactions  . Benadryl (Diphenhydramine Hcl)     "Restless leg syndrome"    Current Facility-Administered Medications  Medication Dose Route Frequency Provider Last Rate Last Dose  . 0.9 %  sodium chloride infusion    Intravenous Continuous Brayton El, PA       Facility-Administered Medications Ordered in Other Encounters  Medication Dose Route Frequency Provider Last Rate Last Dose  . filgrastim (NEUPOGEN) injection 480 mcg  480 mcg Subcutaneous Once Lowella Dell, MD      . sodium chloride 0.9 % injection 10 mL  10 mL Intracatheter PRN Lowella Dell, MD   10 mL at 08/11/12 1605  . sodium chloride 0.9 % injection 10 mL  10 mL Intravenous PRN Lowella Dell, MD   10 mL at 08/11/12 1606    OBJECTIVE: Filed Vitals:   08/24/12 1750  BP: 143/60  Pulse: 70  Temp: 98.2 F (36.8 C)  Resp: 20       ECOG FS: 2  Sclerae unicteric Oropharynx clear No cervical or supraclavicular adenopathy Lungs no rales or rhonchi Heart regular rate and rhythm Abd benign, specifically no masses, +BS, NT to palpation MSK no focal spinal tenderness, no peripheral edema Neuro: nonfocal  LAB RESULTS: Lab Results  Component Value Date   WBC 2.9* 08/24/2012   NEUTROABS 1.0* 08/24/2012   HGB 12.1* 08/24/2012   HCT 35.4* 08/24/2012   MCV 95.4 08/24/2012   PLT 219 08/24/2012    @LASTCHEMISTRY @  No results found for this basename: LABCA2    No components found with this basename: LABCA125    No results found for this basename: INR:1;PROTIME:1 in the last 168 hours  Urinalysis No results found for this basename: colorurine, appearanceur, labspec, phurine, glucoseu, hgbur, bilirubinur, ketonesur, proteinur, urobilinogen, nitrite, leukocytesur    STUDIES: Dg Chest 2 View  08/04/2012  *RADIOLOGY REPORT*  Clinical Data: Fall 2 days ago with left-sided pain  CHEST - 2 VIEW  Comparison: None.  Findings: Exam demonstrates a segment of catheter tubing overlying the right chest and abdomen which appears to represent a right IJ central venous catheter with tip in the region of the right atrium. Lungs are hypoinflated with mild bibasilar opacification left worse than right which may represent a combination of a small  amount of pleural fluid with atelectasis although I could not exclude infection.  There is no evidence of pneumothorax. Cardiomediastinal silhouette is within normal.  There is no definite left rib fracture.  There is mild loss of height of several lower thoracic vertebral bodies.  There are degenerative changes of the spine.  IMPRESSION: Hypoinflated lungs with mild bibasilar opacification which may represent a small amount of pleural fluid with atelectasis, although cannot exclude infection.  Mild loss of vertebral body height of several lower thoracic vertebral bodies.  Right-sided central venous catheter with tip in the region of the right atrium.  Original Report Authenticated By: Elba Barman, M.D.    ASSESSMENT: 66 y.o. s/p allogeneic transplant for chronic lymphoid leukemia, admitted with dehydration secondary to diarrhea, nausea and vomiting, in the setting of CMV reactivation; his treatment history being as follows:  (1) fludarabine/cyclophosphamide/rituximab x5 completed May 2007.  (2) rituximab for 8 doses October 2010, with partial response  (  3) Leustatin and ofatumumab weekly x8 July to September 2011 followed by maintenance ofatumumab maintenance ofatumumab every 2 months, with initial response but rising counts September 2012  (4) status-post unrelated donor stem-cell transplant 02/24/2012 at the [facility]  (a) conditioning regimen consisted of fludarabine + TBI at 200 cGy, followed by rituximab x27;  (b) CMV reactivation x2 (patient CMV positive, donor negative), s/p ganciclovir treatment; 3d reactivation August 2013  (c) GVHD: involving gut and skin, treated with steroids, tacrolimus and MMF  (d) atrial fibrillation: resolved on amiodarone  (e) steroid-induced myopathy: improving  (f) hypomagnesemia  (g) hypogammaglobulinemia: s/p IVIG 07/09/2012  (h) elevated triglycerides (606 on 07/14/2012)   PLAN: Carries diarrhea, nausea and vomiting, may all be related to his ganciclovir  treatment for CMV reactivation. Alternatively this could be due to graft-versus-host disease. It could also be unrelated to the above, and possibly due to C. difficile colitis or a similar cause. The plan is to hydrate and evaluate further, while continuing his treatment, which should include treatment of his neutropenia with G-CSF, treatment of the CMV reactivation with ganciclovir as above, and of course of treatment of the nausea, vomiting, and dehydration. I am hopeful we will see a marked improvement within the next 48 hours as well as have some answers. The patient is a full code at this point.   MAGRINAT,GUSTAV C    08/24/2012

## 2012-08-24 NOTE — Progress Notes (Signed)
RECEIVED A FAX FROM [facility] CONCERNING A PRIOR AUTHORIZATION FOR ONDANSETRON ODT. THIS REQUEST WAS PLACED IN THE MANAGED CARE BIN.

## 2012-08-24 NOTE — Progress Notes (Signed)
Called [facility] @ [phone], per rep ondansetron ODT 8mg  12 tabs does not require precert.  Patient picked up 12 08/20/12.

## 2012-08-25 ENCOUNTER — Other Ambulatory Visit (HOSPITAL_COMMUNITY): Payer: Self-pay

## 2012-08-25 ENCOUNTER — Other Ambulatory Visit: Payer: Self-pay | Admitting: Lab

## 2012-08-25 ENCOUNTER — Ambulatory Visit: Payer: Self-pay

## 2012-08-25 DIAGNOSIS — B259 Cytomegaloviral disease, unspecified: Secondary | ICD-10-CM

## 2012-08-25 DIAGNOSIS — R110 Nausea: Secondary | ICD-10-CM

## 2012-08-25 LAB — COMPREHENSIVE METABOLIC PANEL
ALT: 7 U/L (ref 0–53)
AST: 15 U/L (ref 0–37)
Albumin: 2.5 g/dL — ABNORMAL LOW (ref 3.5–5.2)
Alkaline Phosphatase: 84 U/L (ref 39–117)
Calcium: 8.1 mg/dL — ABNORMAL LOW (ref 8.4–10.5)
Glucose, Bld: 77 mg/dL (ref 70–99)
Potassium: 3.5 mEq/L (ref 3.5–5.1)
Sodium: 138 mEq/L (ref 135–145)
Total Protein: 4.4 g/dL — ABNORMAL LOW (ref 6.0–8.3)

## 2012-08-25 LAB — CBC
Hemoglobin: 9.6 g/dL — ABNORMAL LOW (ref 13.0–17.0)
MCH: 31.6 pg (ref 26.0–34.0)
MCHC: 34 g/dL (ref 30.0–36.0)
Platelets: 160 10*3/uL (ref 150–400)

## 2012-08-25 LAB — IGG, IGA, IGM
IgA: <6 mg/dL — ABNORMAL LOW (ref 68–379)
IgM, Serum: <5 mg/dL — ABNORMAL LOW (ref 41–251)

## 2012-08-25 MED ORDER — FILGRASTIM 480 MCG/1.6ML IJ SOLN: 480.0000 ug | Freq: Once | INTRAVENOUS | Status: DC

## 2012-08-25 MED ORDER — PROCHLORPERAZINE MALEATE 5 MG PO TABS
5.0000 mg | ORAL_TABLET | Freq: Four times a day (QID) | ORAL | Status: DC
  Administered 2012-08-25 – 2012-08-26 (×4): 5 mg via ORAL
  Filled 2012-08-25 (×7): qty 1

## 2012-08-25 MED ORDER — FILGRASTIM 480 MCG/1.6ML IJ SOLN
480.0000 ug | Freq: Every day | INTRAMUSCULAR | Status: DC
  Administered 2012-08-25: 480 ug via SUBCUTANEOUS
  Filled 2012-08-25 (×2): qty 1.6

## 2012-08-25 NOTE — Progress Notes (Signed)
INITIAL ADULT NUTRITION ASSESSMENT Date: 08/25/2012   Time: 12:29 PM Reason for Assessment: Nutrition risk   INTERVENTION: Encouraged pt to select bland foods on regular menu and gradually increase intake. Will monitor.   Pt meets criteria for severe PCM of acute on chronic illness AEB <50% estimated energy intake for the past week with 23.6% weight loss in the past 7 months per pt report. Pt also c/o decreased muscle strength and states he has been getting physical therapy PTA to help him with this.   ASSESSMENT: Male 66 y.o.  Dx: Dehydration  Food/Nutrition Related Hx: Pt with chronic lymphoid leukemia s/p allogenic transplant and various chemotherapy treatments. Pt reports poor intake for the past week r/t nausea/vomiting/diarrhea. Pt states at home he was eating chicken noodle soup, eggs, fruit, and toast. Pt reports he was trying to stay hydrated but it wasn't working. Pt reports having dry heaves this morning, however no vomiting, and no diarrhea since admission. Pt reports not being on any nutritional supplements PTA and states that other than this past week he was eating a regular diet, 3 meals/day. Pt reports weighing 275 pounds in February of this year but contributes his weight loss to be r/t being hospitalized for over a month during which time he was on TNA. Pt reports his lowest weight since then was 198 pounds, however pt up to 210 pounds. Pt states when he was getting CA treatment in [place] he was meeting with an RD, however denies seeing an RD in [place].   Hx: Past Medical History  Diagnosis Date  . 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 Diverticular disease   . Hyperlipidemia   . Obesity   . Hypertension   . Hiatal hernia   . CMV (cytomegalovirus) antibody positive     pre-transplant, with seroconversion x2 pst-transplant  . Right bundle branch block     pre-transplant  . CKD (chronic kidney disease) stage 2, GFR 60-89 ml/min     . Pancytopenia   . Steroid-induced diabetes   . Atrial fibrillation     post-transplant  . Myopathy   . 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 Leukemia, chronic lymphoid   . Chronic graft-versus-host disease    Related Meds:  Scheduled Meds:   . antiseptic oral rinse  15 mL Mouth Rinse q12n4p  . chlorhexidine  15 mL Mouth Rinse BID  . cholecalciferol  1,000 Units Oral Daily  . dexamethasone  1 mg Oral Daily  . enoxaparin (LOVENOX) injection  40 mg Subcutaneous Q24H  . filgrastim (NEUPOGEN)  SQ  480 mcg Subcutaneous q1800  . levofloxacin  750 mg Oral Daily  . loratadine  10 mg Oral Daily  . magnesium sulfate 1 - 4 g bolus IVPB  1 g Intravenous Once  . multivitamin with minerals  1 tablet Oral Daily  . omega-3 acid ethyl esters  1 g Oral Daily  . ondansetron  8 mg Oral BID  . prochlorperazine  5 mg Oral QID  . sertraline  100 mg Oral QODAY  . sertraline  50 mg Oral QODAY  . sulfamethoxazole-trimethoprim  1 tablet Oral Custom  . tacrolimus  1.5 mg Oral BID  . ursodiol  600 mg Oral BID  . valGANciclovir  450 mg Oral BID  . DISCONTD: CENTRUM SILVER ADULT 50+  1 tablet Oral Daily  . DISCONTD: cholestyramine  4 g Oral Custom  . DISCONTD: dexamethasone  1 mg Oral Daily  .  DISCONTD: filgrastim (NEUPOGEN) IV  480 mcg Intravenous ONCE-1800  . DISCONTD: Fish Oil  600 mg Oral Daily  . DISCONTD: magnesium sulfate 1 g IVPB  1 g Intravenous Once  . DISCONTD: sodium fluoride  1 application dental QHS   Continuous Infusions:   . 0.9 % sodium chloride with kcl 125 mL/hr at 08/25/12 0502  . DISCONTD: sodium chloride     PRN Meds:.LORazepam, magic mouthwash w/lidocaine, oxyCODONE, DISCONTD: oxycodone, DISCONTD: prochlorperazine  Ht: 5\' 8"  (172.7 cm)  Wt: 210 lb (95.255 kg)  Ideal Wt: 154 lb % Ideal Wt: 136  Usual Wt: 275 lb in February 2013 per pt report % Usual Wt: 76  Body mass index is 31.93 kg/(m^2). Class I obesity   Labs:  CMP     Component Value Date/Time    NA 138 08/25/2012 0600   NA 136 08/19/2012 1420   K 3.5 08/25/2012 0600   K 4.2 08/19/2012 1420   CL 107 08/25/2012 0600   CL 108* 08/19/2012 1420   CO2 20 08/25/2012 0600   CO2 20* 08/19/2012 1420   GLUCOSE 77 08/25/2012 0600   GLUCOSE 85 08/19/2012 1420   BUN 12 08/25/2012 0600   BUN 18.0 08/19/2012 1420   CREATININE 1.20 08/25/2012 0600   CREATININE 1.3 08/19/2012 1420   CALCIUM 8.1* 08/25/2012 0600   PROT 4.4* 08/25/2012 0600   PROT 5.2* 08/19/2012 1420   ALBUMIN 2.5* 08/25/2012 0600   ALBUMIN 3.0* 08/19/2012 1420   AST 15 08/25/2012 0600   AST 17 08/19/2012 1420   ALT 7 08/25/2012 0600   ALT 11 08/19/2012 1420   ALKPHOS 84 08/25/2012 0600   ALKPHOS 90 08/19/2012 1420   BILITOT 0.2* 08/25/2012 0600   BILITOT 0.50 08/19/2012 1420   GFRNONAA 61* 08/25/2012 0600   GFRAA 71* 08/25/2012 0600    Intake/Output Summary (Last 24 hours) at 08/25/12 1235 Last data filed at 08/25/12 0825  Gross per 24 hour  Intake      0 ml  Output    650 ml  Net   -650 ml   Last BM - 9/3   Diet Order: General   IVF:    0.9 % sodium chloride with kcl Last Rate: 125 mL/hr at 08/25/12 0502  DISCONTD: sodium chloride     Estimated Nutritional Needs:   Kcal:1750-2100 Protein:85-105g Fluid:1.7-2.1L  NUTRITION DIAGNOSIS: -Inadequate oral intake (NI-2.1).  Status: Ongoing  RELATED TO: nausea/dry heaves  AS EVIDENCE BY: pt statement  MONITORING/EVALUATION(Goals): 1. Resolution of nausea/dry heaves 2. Pt to consume >75% of meals.   EDUCATION NEEDS: -Education needs addressed - provided education and handout on high calorie/protein nutrition therapy and gave pt [facility] RD contact information.    Dietitian #: [phone]  DOCUMENTATION CODES Per approved criteria  -Severe malnutrition in the context of acute illness or injury -Obesity Unspecified    Marshall Cork 08/25/2012, 12:29 PM

## 2012-08-25 NOTE — Progress Notes (Signed)
Timothy [name]   DOB:1946-02-14   JY#:[id number]   YQM#:[id number]  Subjective: Teary feels better this morning. He pretty much stating that all night however. He used a urinal. He has had no bowel movements at 3 PM on September 4. On the other hand he had some dry heaves this morning around 6. He had breakfast, clear liquids, without event. He feels his mouth is better. Otherwise a detailed review of systems today was noncontributory  Objective: Middle-aged white man examined in bed. Filed Vitals:   08/25/12 0601  BP: 129/78  Pulse: 89  Temp: 98.2 F (36.8 C)  Resp: 18    Body mass index is 31.93 kg/(m^2).  Intake/Output Summary (Last 24 hours) at 08/25/12 0917 Last data filed at 08/25/12 0825  Gross per 24 hour  Intake      0 ml  Output    650 ml  Net   -650 ml     Sclerae unicteric  Oropharynx clear  No peripheral adenopathy  Lungs clear -- no rales or rhonchi  Heart regular rate and rhythm  Abdomen benign  MSK no focal spinal tenderness, no peripheral edema  Neuro nonfocal  Skin: No rash or suspicious finding  CBG (last 3)  No results found for this basename: GLUCAP:3 in the last 72 hours   Labs:  Lab Results  Component Value Date   WBC 1.8* 08/25/2012   HGB 9.6* 08/25/2012   HCT 28.2* 08/25/2012   MCV 92.8 08/25/2012   PLT 160 08/25/2012   NEUTROABS 1.0* 08/24/2012    Urine Studies No results found for this basename: UACOL:2,UAPR:2,USPG:2,UPH:2,UTP:2,UGL:2,UKET:2,UBIL:2,UHGB:2,UNIT:2,UROB:2,ULEU:2,UEPI:2,UWBC:2,URBC:2,UBAC:2,CAST:2,CRYS:2,UCOM:2,BILUA:2 in the last 72 hours  Basic Metabolic Panel:  Lab 08/25/12 5284 08/24/12 1830 08/19/12 1420 08/18/12 1429  NA 138 137 136 --  K 3.5 3.5 -- --  CL 107 104 108* --  CO2 20 21 20* --  GLUCOSE 77 86 85 --  BUN 12 14 18.0 --  CREATININE 1.20 1.33 1.3 --  CALCIUM 8.1* 8.6 -- --  MG -- 1.4* -- 1.7  PHOS -- -- -- --   GFR Estimated Creatinine Clearance: 67.8 ml/min (by C-G formula based on Cr of 1.2). Liver Function  Tests:  Lab 08/25/12 0600 08/24/12 1830 08/19/12 1420  AST 15 14 17   ALT 7 7 11   ALKPHOS 84 92 90  BILITOT 0.2* 0.2* 0.50  PROT 4.4* 5.1* 5.2*  ALBUMIN 2.5* 3.0* 3.0*   No results found for this basename: LIPASE:5,AMYLASE:5 in the last 168 hours No results found for this basename: AMMONIA:5 in the last 168 hours Coagulation profile No results found for this basename: INR:5,PROTIME:5 in the last 168 hours  CBC:  Lab 08/25/12 0600 08/24/12 1830 08/24/12 1433 08/20/12 1340 08/19/12 1420  WBC 1.8* 2.0* 2.9* 1.8* 1.8*  NEUTROABS -- -- 1.0* 0.9* 1.1*  HGB 9.6* 10.2* 12.1* 10.3* 10.1*  HCT 28.2* 30.0* 35.4* 29.7* 29.2*  MCV 92.8 92.6 95.4 91.7 92.1  PLT 160 148* 219 100* 91*   Cardiac Enzymes: No results found for this basename: CKTOTAL:5,CKMB:5,CKMBINDEX:5,TROPONINI:5 in the last 168 hours BNP: No components found with this basename: POCBNP:5 CBG: No results found for this basename: GLUCAP:5 in the last 168 hours D-Dimer No results found for this basename: DDIMER:2 in the last 72 hours Hgb A1c No results found for this basename: HGBA1C:2 in the last 72 hours Lipid Profile No results found for this basename: CHOL:2,HDL:2,LDLCALC:2,TRIG:2,CHOLHDL:2,LDLDIRECT:2 in the last 72 hours Thyroid function studies No results found for this basename: TSH,T4TOTAL,FREET3,T3FREE,THYROIDAB in the last 72  hours Anemia work up No results found for this basename: VITAMINB12:2,FOLATE:2,FERRITIN:2,TIBC:2,IRON:2,RETICCTPCT:2 in the last 72 hours Microbiology No results found for this or any previous visit (from the past 240 hour(s)).    Studies:  No results found.  Assessment: 66 y.o. [place] man s/p allogeneic transplant for chronic lymphoid leukemia, admitted with dehydration secondary to diarrhea, nausea and vomiting, in the setting of CMV reactivation; his treatment history being as follows:  (1) fludarabine/cyclophosphamide/rituximab x5 completed May 2007.  (2) rituximab for 8 doses  October 2010, with partial response  (3) Leustatin and ofatumumab weekly x8 July to September 2011 followed by maintenance ofatumumab maintenance ofatumumab every 2 months, with initial response but rising counts September 2012  (4) status-post unrelated donor stem-cell transplant 02/24/2012 [facility]  (a) conditioning regimen consisted of fludarabine + TBI at 200 cGy, followed by rituximab x27;  (b) CMV reactivation x2 (patient CMV positive, donor negative), s/p ganciclovir treatment; 3d reactivation August 2013  (c) GVHD: involving gut and skin, treated with steroids, tacrolimus and MMF  (d) atrial fibrillation: resolved on amiodarone  (e) steroid-induced myopathy: improving  (f) hypomagnesemia  (g) hypogammaglobulinemia: s/p IVIG 07/09/2012  (h) elevated triglycerides (606 on 07/14/2012)    Plan: I am starting G-CSF today as per protocol. We have been unable to get any stool for PCR, and if he does not produce of stool in the next 24 hours we will discontinue that order. I am holding his blood pressure medications given that he was dehydrated. They may need to be resumed tomorrow. I am advancing his diet. I have encouraged him to get out of bed. If he is able to eat and drink well of and ambulate some, I will let him go tomorrow.    Cassia Fein C 08/25/2012

## 2012-08-25 NOTE — Care Management Note (Unsigned)
    Page 1 of 1   08/25/2012     12:32:15 PM   CARE MANAGEMENT NOTE 08/25/2012  Patient:  Timothy [name], Timothy [name]   Account Number:  [id number]  Date Initiated:  08/25/2012  Documentation initiated by:  Lanier Clam  Subjective/Objective Assessment:   ADMITTED W/N/V/D.AV:[id number] LYNPHOID LEUKEMIA.S/P CELL TRANSPLANT-[facility].     Action/Plan:   FROM HOME W/SPOUSE   Anticipated DC Date:  08/30/2012   Anticipated DC Plan:  HOME/SELF CARE      DC Planning Services  CM consult      Choice offered to / List presented to:             Status of service:  In process, will continue to follow Medicare Important Message given?   (If response is "NO", the following Medicare IM given date fields will be blank) Date Medicare IM given:   Date Additional Medicare IM given:    Discharge Disposition:    Per UR Regulation:  Reviewed for med. necessity/level of care/duration of stay  If discussed at Long Length of Stay Meetings, dates discussed:    Comments:  08/25/12 [facility] RN,BSN NCM [phone]

## 2012-08-26 ENCOUNTER — Ambulatory Visit (HOSPITAL_COMMUNITY): Admission: RE | Admit: 2012-08-26 | Payer: BC Managed Care – PPO | Source: Ambulatory Visit | Admitting: Oncology

## 2012-08-26 ENCOUNTER — Inpatient Hospital Stay (HOSPITAL_COMMUNITY): Admission: RE | Admit: 2012-08-26 | Payer: Self-pay | Source: Ambulatory Visit

## 2012-08-26 DIAGNOSIS — T865 Complications of stem cell transplant: Secondary | ICD-10-CM

## 2012-08-26 DIAGNOSIS — R5381 Other malaise: Secondary | ICD-10-CM

## 2012-08-26 LAB — COMPREHENSIVE METABOLIC PANEL
AST: 18 U/L (ref 0–37)
Albumin: 2.5 g/dL — ABNORMAL LOW (ref 3.5–5.2)
Chloride: 107 mEq/L (ref 96–112)
Creatinine, Ser: 1.03 mg/dL (ref 0.50–1.35)
Total Bilirubin: 0.2 mg/dL — ABNORMAL LOW (ref 0.3–1.2)

## 2012-08-26 LAB — CBC WITH DIFFERENTIAL/PLATELET
Basophils Absolute: 0 10*3/uL (ref 0.0–0.1)
Basophils Relative: 1 % (ref 0–1)
HCT: 28.7 % — ABNORMAL LOW (ref 39.0–52.0)
MCHC: 33.8 g/dL (ref 30.0–36.0)
Monocytes Absolute: 0.5 10*3/uL (ref 0.1–1.0)
Neutro Abs: 1.8 10*3/uL (ref 1.7–7.7)
Neutrophils Relative %: 57 % (ref 43–77)
Platelets: 157 10*3/uL (ref 150–400)
RDW: 15.8 % — ABNORMAL HIGH (ref 11.5–15.5)

## 2012-08-26 LAB — PROTIME-INR
INR: 1.17 (ref 0.00–1.49)
Prothrombin Time: 15.1 seconds (ref 11.6–15.2)

## 2012-08-26 LAB — APTT: aPTT: 40 seconds — ABNORMAL HIGH (ref 24–37)

## 2012-08-26 MED ORDER — BIOTENE DRY MOUTH MT LIQD: 15.0000 mL | Freq: Two times a day (BID) | OROMUCOSAL | Status: DC

## 2012-08-26 MED ORDER — LISINOPRIL 10 MG PO TABS
10.0000 mg | ORAL_TABLET | Freq: Every day | ORAL | Status: DC
  Filled 2012-08-26: qty 1

## 2012-08-26 MED ORDER — MAGIC MOUTHWASH W/LIDOCAINE: 5.0000 mL | Freq: Four times a day (QID) | ORAL | Status: DC | PRN

## 2012-08-26 MED ORDER — CHLORHEXIDINE GLUCONATE 0.12 % MT SOLN: 15.0000 mL | Freq: Two times a day (BID) | OROMUCOSAL | Status: DC

## 2012-08-26 NOTE — Progress Notes (Signed)
Discharge summary sent to payer through [facility]  

## 2012-08-26 NOTE — Discharge Summary (Signed)
Physician Discharge Summary  Patient ID: Timothy [name] MRN: [id number] DOB/AGE: Oct 16, 1946 66 y.o.  Admit date: 08/24/2012 Discharge date: 08/26/2012  Primary Care Physician:  Kari Baars, MD   Discharge Diagnoses:  Dehydration, uncontrolled nausea and vomiting, diarrhea, immunocompromised host, pancytopenia, hypomagnesemia, severe deconditioning, malnutrition  Present on Admission:  **None**  Discharge Medications:  Medication List  As of 08/26/2012  1:54 PM   STOP taking these medications         amLODipine 5 MG tablet      labetalol 200 MG tablet      NON FORMULARY      PRIVIGEN 40 GM/400ML Soln         TAKE these medications         antiseptic oral rinse Liqd   15 mLs by Mouth Rinse route 2 times daily at 12 noon and 4 pm.      Beclomethasone Dipropionate Powd   Take 2 mg by mouth 2 (two) times daily. In corn oil emulsion to equal 2mg  per ml   On tapered dose  Take with food      CENTRUM SILVER ADULT 50+ Tabs   Take 1 tablet by mouth daily.      chlorhexidine 0.12 % solution   Commonly known as: PERIDEX   Use as directed 15 mLs in the mouth or throat 2 (two) times daily.      cholecalciferol 1000 UNITS tablet   Commonly known as: VITAMIN D   Take 1,000 Units by mouth daily.      CITRACAL PO   Take 1,200 mg by mouth 2 (two) times daily.      dexamethasone 0.5 MG/5ML elixir   Take 1 mg by mouth daily. Rinse mouth 4-6 times daily and spit out      dextrose 5 % SOLN 50 mL with magnesium sulfate 50 % SOLN 1 g   Inject 1 g into the vein once. Gets this at [facility]      Fish Oil 300 MG Caps   Take 600 mg by mouth daily.      levofloxacin 750 MG tablet   Commonly known as: LEVAQUIN   Take 750 mg by mouth daily.      lisinopril 10 MG tablet   Commonly known as: PRINIVIL,ZESTRIL   Take 10 mg by mouth at bedtime.      loratadine 10 MG tablet   Commonly known as: CLARITIN   Take 10 mg by mouth daily.      LORazepam 0.5 MG tablet   Commonly  known as: ATIVAN   Take 0.5 mg by mouth every 6 (six) hours as needed. anxiety      magic mouthwash w/lidocaine Soln   Take 5 mLs by mouth 4 (four) times daily as needed (as needed for mouth discomfort).      magic mouthwash w/lidocaine Soln   Take 5 mLs by mouth 4 (four) times daily as needed. Mouth sores      NEUPOGEN 480 MCG/1.6ML injection   Generic drug: filgrastim   Inject 480 mcg into the skin once.      omeprazole 20 MG capsule   Commonly known as: PRILOSEC   Take 20 mg by mouth daily.      ondansetron 8 MG disintegrating tablet   Commonly known as: ZOFRAN-ODT   Take 8 mg by mouth 2 (two) times daily.      oxycodone 5 MG capsule   Commonly known as: OXY-IR   Take 5 mg by mouth every  6 (six) hours as needed. Pain      prochlorperazine 10 MG tablet   Commonly known as: COMPAZINE   Take 10 mg by mouth every 6 (six) hours as needed. Nausea      sertraline 50 MG tablet   Commonly known as: ZOLOFT   Take 50-100 mg by mouth daily. Takes 50 mg one day and 100 mg the next day      sulfamethoxazole-trimethoprim 800-160 MG per tablet   Commonly known as: BACTRIM DS   Take 1 tablet by mouth 2 (two) times daily.      tacrolimus 1 MG capsule   Commonly known as: PROGRAF   Take 1.5 mg by mouth 2 (two) times daily. Takes with a 0.5 mg capsule      tacrolimus 0.5 MG capsule   Commonly known as: PROGRAF   Take 1.5 mg by mouth 2 (two) times daily. Takes with 1 mg capsule      ursodiol 300 MG capsule   Commonly known as: ACTIGALL   Take 600 mg by mouth 2 (two) times daily.      valGANciclovir 450 MG tablet   Commonly known as: VALCYTE   Take 450 mg by mouth 2 (two) times daily.             Disposition and Follow-up: seebus in office 08/27/2012  Significant Diagnostic Studies:  Dg Chest 2 View  08/04/2012  *RADIOLOGY REPORT*  Clinical Data: Fall 2 days ago with left-sided pain  CHEST - 2 VIEW  Comparison: None.  Findings: Exam demonstrates a segment of catheter  tubing overlying the right chest and abdomen which appears to represent a right IJ central venous catheter with tip in the region of the right atrium. Lungs are hypoinflated with mild bibasilar opacification left worse than right which may represent a combination of a small amount of pleural fluid with atelectasis although I could not exclude infection.  There is no evidence of pneumothorax. Cardiomediastinal silhouette is within normal.  There is no definite left rib fracture.  There is mild loss of height of several lower thoracic vertebral bodies.  There are degenerative changes of the spine.  IMPRESSION: Hypoinflated lungs with mild bibasilar opacification which may represent a small amount of pleural fluid with atelectasis, although cannot exclude infection.  Mild loss of vertebral body height of several lower thoracic vertebral bodies.  Right-sided central venous catheter with tip in the region of the right atrium.  Original Report Authenticated By: Elba Barman, M.D.    Discharge Laboratory Values: Basic Metabolic Panel:  Lab 08/26/12 1610 08/25/12 0600 08/24/12 1830 08/19/12 1420  NA 136 138 137 136  K 3.7 3.5 -- --  CL 107 107 104 108*  CO2 21 20 21  20*  GLUCOSE 73 77 86 85  BUN 8 12 14  18.0  CREATININE 1.03 1.20 1.33 1.3  CALCIUM 8.2* 8.1* 8.6 --  MG 1.3* -- 1.4* --  PHOS -- -- -- --   GFR Estimated Creatinine Clearance: 79 ml/min (by C-G formula based on Cr of 1.03). Liver Function Tests:  Lab 08/26/12 0540 08/25/12 0600 08/24/12 1830 08/19/12 1420  AST 18 15 14 17   ALT 7 7 7 11   ALKPHOS 84 84 92 90  BILITOT 0.2* 0.2* 0.2* 0.50  PROT 4.4* 4.4* 5.1* 5.2*  ALBUMIN 2.5* 2.5* 3.0* 3.0*   No results found for this basename: LIPASE:5,AMYLASE:5 in the last 168 hours No results found for this basename: AMMONIA:5 in the last 168 hours Coagulation  profile  Lab 08/26/12 0540  INR 1.17  PROTIME --    CBC:  Lab 08/26/12 0540 08/25/12 0600 08/24/12 1830 08/24/12 1433 08/20/12  1340  WBC 3.1* 1.8* 2.0* 2.9* 1.8*  NEUTROABS 1.8 -- -- 1.0* 0.9*  HGB 9.7* 9.6* 10.2* 12.1* 10.3*  HCT 28.7* 28.2* 30.0* 35.4* 29.7*  MCV 93.2 92.8 92.6 95.4 91.7  PLT 157 160 148* 219 100*   Cardiac Enzymes: No results found for this basename: CKTOTAL:5,CKMB:5,CKMBINDEX:5,TROPONINI:5 in the last 168 hours BNP: No components found with this basename: POCBNP:5 CBG: No results found for this basename: GLUCAP:5 in the last 168 hours D-Dimer No results found for this basename: DDIMER:2 in the last 72 hours Hgb A1c No results found for this basename: HGBA1C:2 in the last 72 hours Lipid Profile No results found for this basename: CHOL:2,HDL:2,LDLCALC:2,TRIG:2,CHOLHDL:2,LDLDIRECT:2 in the last 72 hours Thyroid function studies No results found for this basename: TSH,T4TOTAL,FREET3,T3FREE,THYROIDAB in the last 72 hours Anemia work up No results found for this basename: VITAMINB12:2,FOLATE:2,FERRITIN:2,TIBC:2,IRON:2,RETICCTPCT:2 in the last 72 hours Microbiology No results found for this or any previous visit (from the past 240 hour(s)).   Brief H and P: For complete details please refer to admission H and P, but in brief, patient was admitted with dehydration, nausea and vomiting, and diarrhea.  Physical Exam at Discharge: BP 142/76  Pulse 82  Temp 97.8 F (36.6 C) (Oral)  Resp 20  Ht 5\' 8"  (1.727 m)  Wt 210 lb (95.255 kg)  BMI 31.93 kg/m2  SpO2 97% RUE:AVWU improved, able to ambulate some in halls; taking po's w/o nausea/vomiting Cardiovascular:RRR, no murmur appreciated Respiratory:no crackles or wheezes Gastrointestinal:soft, nontender, +BS Extremities:no edema    Hospital Course:  Active Problems:  Diarrhea  Dehydration  Nausea and vomiting  Immunocompromised The patient was hydrated aggressively and started on GI contact precautions. However he did not have any further BMs after admission, so PCR for c difficile never drawn. With hydration his functional status  improved and his creatinine clearance normalized. He received GCSF with a good initial WBC response. Compazine was added to control the nausea and at the time of discharge patient is able to take limited pos well. He remains sevrely deconditioned and we have discussed an exercise program for him. 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 A bone marrow biopsy scheduled for 9/5 had to be rescheduled for next week.   Diet:  regular  Activity:  As tolerated, OOB as much as possible  Condition at Discharge:   Much improved  Signed: Dr. Ruthann Cancer [phone]  08/26/2012, 1:54 PM

## 2012-08-27 ENCOUNTER — Ambulatory Visit (HOSPITAL_BASED_OUTPATIENT_CLINIC_OR_DEPARTMENT_OTHER): Payer: BC Managed Care – PPO | Admitting: Physician Assistant

## 2012-08-27 ENCOUNTER — Encounter: Payer: Self-pay | Admitting: Physician Assistant

## 2012-08-27 ENCOUNTER — Ambulatory Visit (HOSPITAL_BASED_OUTPATIENT_CLINIC_OR_DEPARTMENT_OTHER): Payer: BC Managed Care – PPO

## 2012-08-27 ENCOUNTER — Other Ambulatory Visit (HOSPITAL_BASED_OUTPATIENT_CLINIC_OR_DEPARTMENT_OTHER): Payer: BC Managed Care – PPO

## 2012-08-27 ENCOUNTER — Telehealth: Payer: Self-pay | Admitting: *Deleted

## 2012-08-27 ENCOUNTER — Other Ambulatory Visit: Payer: Self-pay | Admitting: Physician Assistant

## 2012-08-27 ENCOUNTER — Ambulatory Visit: Payer: BC Managed Care – PPO

## 2012-08-27 VITALS — BP 148/69 | HR 105 | Temp 98.3°F | Resp 20 | Ht 68.0 in | Wt 208.0 lb

## 2012-08-27 DIAGNOSIS — C9110 Chronic lymphocytic leukemia of B-cell type not having achieved remission: Secondary | ICD-10-CM

## 2012-08-27 DIAGNOSIS — E860 Dehydration: Secondary | ICD-10-CM

## 2012-08-27 DIAGNOSIS — R768 Other specified abnormal immunological findings in serum: Secondary | ICD-10-CM

## 2012-08-27 DIAGNOSIS — D89811 Chronic graft-versus-host disease: Secondary | ICD-10-CM

## 2012-08-27 DIAGNOSIS — R894 Abnormal immunological findings in specimens from other organs, systems and tissues: Secondary | ICD-10-CM

## 2012-08-27 DIAGNOSIS — Z9489 Other transplanted organ and tissue status: Secondary | ICD-10-CM

## 2012-08-27 DIAGNOSIS — D801 Nonfamilial hypogammaglobulinemia: Secondary | ICD-10-CM

## 2012-08-27 DIAGNOSIS — B259 Cytomegaloviral disease, unspecified: Secondary | ICD-10-CM

## 2012-08-27 DIAGNOSIS — R7689 Other specified abnormal immunological findings in serum: Secondary | ICD-10-CM

## 2012-08-27 LAB — CBC WITH DIFFERENTIAL/PLATELET
BASO%: 0.9 % (ref 0.0–2.0)
EOS%: 0.9 % (ref 0.0–7.0)
HCT: 36.8 % — ABNORMAL LOW (ref 38.4–49.9)
MCHC: 33.7 g/dL (ref 32.0–36.0)
MONO#: 1.3 10*3/uL — ABNORMAL HIGH (ref 0.1–0.9)
MONO%: 29.7 % — ABNORMAL HIGH (ref 0.0–14.0)
NEUT#: 1.6 10*3/uL (ref 1.5–6.5)
Platelets: 191 10*3/uL (ref 140–400)
WBC: 4.3 10*3/uL (ref 4.0–10.3)

## 2012-08-27 LAB — COMPREHENSIVE METABOLIC PANEL (CC13)
ALT: 9 U/L (ref 0–55)
AST: 26 U/L (ref 5–34)
Albumin: 3.1 g/dL — ABNORMAL LOW (ref 3.5–5.0)
Alkaline Phosphatase: 119 U/L (ref 40–150)
Glucose: 74 mg/dl (ref 70–99)
Potassium: 3.6 mEq/L (ref 3.5–5.1)
Sodium: 136 mEq/L (ref 136–145)
Total Bilirubin: 0.5 mg/dL (ref 0.20–1.20)
Total Protein: 5.3 g/dL — ABNORMAL LOW (ref 6.4–8.3)

## 2012-08-27 LAB — MAGNESIUM (CC13): Magnesium: 1.5 mg/dl (ref 1.5–2.5)

## 2012-08-27 MED ORDER — FILGRASTIM 480 MCG/0.8ML IJ SOLN
480.0000 ug | Freq: Once | INTRAMUSCULAR | Status: AC
  Administered 2012-08-27: 480 ug via SUBCUTANEOUS
  Filled 2012-08-27: qty 0.8

## 2012-08-27 MED ORDER — SODIUM CHLORIDE 0.9 % IV SOLN
Freq: Once | INTRAVENOUS | Status: AC
  Administered 2012-08-27: 14:00:00 via INTRAVENOUS
  Filled 2012-08-27: qty 1000

## 2012-08-27 MED ORDER — MAGNESIUM SULFATE 50 % IJ SOLN: 1.0000 g | Freq: Once | INTRAVENOUS | Status: DC

## 2012-08-27 MED ORDER — SODIUM CHLORIDE 0.9 % IV SOLN: Freq: Once | INTRAVENOUS | Status: DC

## 2012-08-27 NOTE — Patient Instructions (Addendum)
[facility] Discharge Instructions for Patients Receiving Chemotherapy  Today you received the following Magnesium Sulfate and Neupogen  To help prevent nausea and vomiting after your treatment, we encourage you to take your nausea medication Begin taking it at 7 pm and take it as often as prescribed for the next 24 to 72 hours.   If you develop nausea and vomiting that is not controlled by your nausea medication, call the clinic. If it is after clinic hours your family physician or the after hours number for the clinic or go to the Emergency Department.   BELOW ARE SYMPTOMS THAT SHOULD BE REPORTED IMMEDIATELY:  *FEVER GREATER THAN 100.5 F  *CHILLS WITH OR WITHOUT FEVER  NAUSEA AND VOMITING THAT IS NOT CONTROLLED WITH YOUR NAUSEA MEDICATION  *UNUSUAL SHORTNESS OF BREATH  *UNUSUAL BRUISING OR BLEEDING  TENDERNESS IN MOUTH AND THROAT WITH OR WITHOUT PRESENCE OF ULCERS  *URINARY PROBLEMS  *BOWEL PROBLEMS  UNUSUAL RASH Items with * indicate a potential emergency and should be followed up as soon as possible.  One of the nurses will contact you 24 hours after your treatment. Please let the nurse know about any problems that you may have experienced. Feel free to call the clinic you have any questions or concerns. The clinic phone number is [phone].   I have been informed and understand all the instructions given to me. I know to contact the clinic, my physician, or go to the Emergency Department if any problems should occur. I do not have any questions at this time, but understand that I may call the clinic during office hours   should I have any questions or need assistance in obtaining follow up care.    __________________________________________  _____________  __________ Signature of Patient or Authorized Representative            Date                   Time    __________________________________________ Nurse's Signature

## 2012-08-27 NOTE — Progress Notes (Signed)
Informed pt per Zollie Scale, PA, that CMV results from last week went down from over 3,000 on 08/11/12 to 1,071 on 08/18/12, and it was sent again today, to be resulted next week.  He verbalizes understanding.

## 2012-08-27 NOTE — Addendum Note (Signed)
Addended by: Neita Goodnight on: 08/27/2012 01:21 PM   Modules accepted: Orders

## 2012-08-27 NOTE — Progress Notes (Signed)
ID: Timothy [name]   DOB: 12/13/1946  MR#: [id number]  LKG#:[id number]  HISTORY OF PRESENT ILLNESS: We have very complete records from Dr. Sydnee [name] in [place], and in summary:  The patient was initially diagnosed in August 2000, with a white cell count of 23,600, but normal hemoglobin and platelets, and no significant symptomatology. Over the next several years his white cell count drifted up, and he eventually developed some symptoms of night sweats in particular, leading to treatment with fludarabine, Cytoxan and rituxan for five cycles given between December 2006 and May 2007.  We have CT scans from June 2006, November 2006 and April 2007, and comparing the November 2006 and April 2007 scans, there was near complete response. He had subsequent therapy in [place] as detailed below, but with decreased response, leading to allogeneic stem-cell transplant at the [facility] 02/24/2012.  INTERVAL HISTORY: Timothy [name] returns with his wife Timothy [name]  for followup, status post allogenic stem cell transplant for CLL in March 2013. Interval history is notable for recent hospitalization from 08/24/2012 2 08/26/2012 for dehydration, uncontrolled nausea and vomiting, diarrhea, pancytopenia, and severe deconditioning. Timothy [name] returns today for reassessment, repeat labs, and is also scheduled for supportive IV fluids with IV magnesium supplementation. He is also due for his next Neupogen injection with an ANC. of 1.6 today.  Over half of our 50 minute appointment today was spent counseling Timothy [name] regarding his treatment plan, reviewing his medications, and coordinating care.  REVIEW OF SYSTEMS: Tell is still very weak and tired.  He is trying to start a daily walking program to regain his stamina. He continues on ganciclovir which "makes him feel bad" and causes nausea. He had one brief episode of emesis this morning. His diarrhea, however, has resolved. He has not had a bowel movement since his  discharge from the hospital yesterday. He denies any abdominal pain, and has been passing gas. He is eating very little, but trying to keep himself hydrated. He admits that this is very difficult and that he is probably not taking in enough fluids. Fortunately, he has no mouth ulcers or oral sensitivity of current time. He is noted no change in his urinary habits, and notes that his urine is "light yellow". He continues to have shortness of breath with exertion, but denies chest pain or palpitations. No abnormal headaches or dizziness.  He has some occasional lower back pain which is mild. The pain in his ribs has almost resolved. He denies any additional myalgias, arthralgias, or bony pain. He has only mild swelling in his feet and ankles which is stable.   A detailed review of systems is otherwise stable and noncontributory.   PAST MEDICAL HISTORY: Past Medical History  Diagnosis Date  . 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 Diverticular disease   . Hyperlipidemia   . Obesity   . Hypertension   . Hiatal hernia   . CMV (cytomegalovirus) antibody positive     pre-transplant, with seroconversion x2 pst-transplant  . Right bundle branch block     pre-transplant  . CKD (chronic kidney disease) stage 2, GFR 60-89 ml/min   . Pancytopenia   . Steroid-induced diabetes   . Atrial fibrillation     post-transplant  . Myopathy   . 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 Leukemia, chronic lymphoid   . 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 Tonsillectomy and  adenoidectomy   . Bone marrow transplant     FAMILY HISTORY Family History  Problem Relation Age of Onset  . Cancer Father    The patient's father died from complications of chronic lymphocytic leukemia at the age of 24.  It had been diagnosed seven years before when he was 80.  The patient is enrolled in a familial chronic  lymphocytic leukemia study out of the [facility].  The patient's mother is 41, alive, unfortunately suffering with dementia, and he has a brother, 74, who is otherwise in fair health.   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  His wife of >40 years, Timothy [name], is a homemaker.  Their daughter, Timothy [name], lives in [place].  She also is a Futures trader.  The patient has an 37 year old grandson and an 93-year-old granddaughter, and that is really the main reason is moved to this area.  He is a International aid/development worker.     ADVANCED DIRECTIVES:  HEALTH MAINTENANCE: History  Substance Use Topics  . Smoking status: Never Smoker   . Smokeless tobacco: Never Used  . Alcohol Use: No     Colonoscopy:  PAP:  Bone density:  Lipid panel:  Allergies  Allergen Reactions  . Benadryl (Diphenhydramine Hcl)     "Restless leg syndrome"    Current Outpatient Prescriptions  Medication Sig Dispense Refill  . Alum & Mag Hydroxide-Simeth (MAGIC MOUTHWASH W/LIDOCAINE) SOLN Take 5 mLs by mouth 4 (four) times daily as needed. Mouth sores      . Alum & Mag Hydroxide-Simeth (MAGIC MOUTHWASH W/LIDOCAINE) SOLN Take 5 mLs by mouth 4 (four) times daily as needed (as needed for mouth discomfort).  100 mL  6  . antiseptic oral rinse (BIOTENE) LIQD 15 mLs by Mouth Rinse route 2 times daily at 12 noon and 4 pm.  237 mL  6  . Beclomethasone Dipropionate POWD Take 2 mg by mouth 2 (two) times daily. In corn oil emulsion to equal 2mg  per ml  On tapered dose Take with food      . Calcium Citrate (CITRACAL PO) Take 1,200 mg by mouth 2 (two) times daily.      . chlorhexidine (PERIDEX) 0.12 % solution Use as directed 15 mLs in the mouth or throat 2 (two) times daily.  120 mL  6  . cholecalciferol (VITAMIN D) 1000 UNITS tablet Take 1,000 Units by mouth daily.      Marland Kitchen dexamethasone 0.5 MG/5ML elixir Take 1  mg by mouth daily. Rinse mouth 4-6 times daily and spit out      . dextrose 5 % SOLN 50 mL with magnesium sulfate 50 % SOLN 1 g Inject 1 g into the vein once. Gets [facility]      . filgrastim (NEUPOGEN) 480 MCG/1.6ML injection Inject 480 mcg into the skin once.      Marland Kitchen levofloxacin (LEVAQUIN) 750 MG tablet Take 750 mg by mouth daily.      Marland Kitchen lisinopril (PRINIVIL,ZESTRIL) 10 MG tablet Take 10 mg by mouth at bedtime.      Marland Kitchen loratadine (CLARITIN) 10 MG tablet Take 10 mg by mouth daily.      Marland Kitchen LORazepam (ATIVAN) 0.5 MG tablet Take 0.5 mg by mouth every 6 (six) hours as needed. anxiety      . Multiple Vitamins-Minerals (CENTRUM SILVER ADULT 50+) TABS Take 1 tablet by mouth daily.      Marland Kitchen  Omega-3 Fatty Acids (FISH OIL) 300 MG CAPS Take 600 mg by mouth daily.      Marland Kitchen omeprazole (PRILOSEC) 20 MG capsule Take 20 mg by mouth daily.      . ondansetron (ZOFRAN-ODT) 8 MG disintegrating tablet Take 8 mg by mouth 2 (two) times daily.       Marland Kitchen oxycodone (OXY-IR) 5 MG capsule Take 5 mg by mouth every 6 (six) hours as needed. Pain      . prochlorperazine (COMPAZINE) 10 MG tablet Take 10 mg by mouth every 6 (six) hours as needed. Nausea      . sertraline (ZOLOFT) 50 MG tablet Take 50-100 mg by mouth daily. Takes 50 mg one day and 100 mg the next day      . sulfamethoxazole-trimethoprim (BACTRIM DS) 800-160 MG per tablet Take 1 tablet by mouth 2 (two) times daily.       . tacrolimus (PROGRAF) 0.5 MG capsule Take 1.5 mg by mouth 2 (two) times daily. Takes with 1 mg capsule      . tacrolimus (PROGRAF) 1 MG capsule Take 1.5 mg by mouth 2 (two) times daily. Takes with a 0.5 mg capsule      . ursodiol (ACTIGALL) 300 MG capsule Take 600 mg by mouth 2 (two) times daily.      . valGANciclovir (VALCYTE) 450 MG tablet Take 450 mg by mouth 2 (two) times daily.       No current facility-administered medications for this visit.   Facility-Administered Medications Ordered in Other Visits  Medication Dose Route Frequency Provider  Last Rate Last Dose  . filgrastim (NEUPOGEN) injection 480 mcg  480 mcg Subcutaneous Once Lowella Dell, MD      . sodium chloride 0.9 % injection 10 mL  10 mL Intracatheter PRN Lowella Dell, MD   10 mL at 08/11/12 1605  . sodium chloride 0.9 % injection 10 mL  10 mL Intravenous PRN Lowella Dell, MD   10 mL at 08/11/12 1606  . DISCONTD: antiseptic oral rinse (BIOTENE) solution 15 mL  15 mL Mouth Rinse q12n4p Lowella Dell, MD   15 mL at 08/26/12 1230  . DISCONTD: chlorhexidine (PERIDEX) 0.12 % solution 15 mL  15 mL Mouth Rinse BID Lowella Dell, MD   15 mL at 08/26/12 0830  . DISCONTD: cholecalciferol (VITAMIN D) tablet 1,000 Units  1,000 Units Oral Daily Lowella Dell, MD   1,000 Units at 08/26/12 1200  . DISCONTD: dexamethasone (DECADRON) 1 MG/ML solution 1 mg  1 mg Oral Daily Lowella Dell, MD   1 mg at 08/25/12 0939  . DISCONTD: filgrastim (NEUPOGEN) injection 480 mcg  480 mcg Subcutaneous q1800 Lowella Dell, MD   480 mcg at 08/25/12 1836  . DISCONTD: levofloxacin (LEVAQUIN) tablet 750 mg  750 mg Oral Daily Lowella Dell, MD   750 mg at 08/26/12 1200  . DISCONTD: lisinopril (PRINIVIL,ZESTRIL) tablet 10 mg  10 mg Oral Daily Lowella Dell, MD      . DISCONTD: loratadine (CLARITIN) tablet 10 mg  10 mg Oral Daily Lowella Dell, MD   10 mg at 08/26/12 1200  . DISCONTD: LORazepam (ATIVAN) tablet 0.5 mg  0.5 mg Oral Q6H PRN Lowella Dell, MD   0.5 mg at 08/25/12 1439  . DISCONTD: magic mouthwash w/lidocaine  5 mL Oral QID PRN Lowella Dell, MD      . DISCONTD: multivitamin with minerals tablet 1 tablet  1 tablet Oral Daily  Lowella Dell, MD   1 tablet at 08/26/12 1200  . DISCONTD: omega-3 acid ethyl esters (LOVAZA) capsule 1 g  1 g Oral Daily Lowella Dell, MD   1 g at 08/26/12 1200  . DISCONTD: ondansetron (ZOFRAN-ODT) disintegrating tablet 8 mg  8 mg Oral BID Lowella Dell, MD   8 mg at 08/26/12 1200  . DISCONTD: oxyCODONE (Oxy  IR/ROXICODONE) immediate release tablet 5 mg  5 mg Oral Q6H PRN Lowella Dell, MD   5 mg at 08/26/12 0537  . DISCONTD: prochlorperazine (COMPAZINE) tablet 5 mg  5 mg Oral QID Lowella Dell, MD   5 mg at 08/26/12 1200  . DISCONTD: sertraline (ZOLOFT) tablet 100 mg  100 mg Oral QODAY Lowella Dell, MD   100 mg at 08/25/12 0939  . DISCONTD: sertraline (ZOLOFT) tablet 50 mg  50 mg Oral QODAY Lowella Dell, MD   50 mg at 08/26/12 1200  . DISCONTD: sodium chloride 0.9 % 1,000 mL with potassium chloride 10 mEq infusion   Intravenous Continuous Lowella Dell, MD 125 mL/hr at 08/25/12 2326    . DISCONTD: sulfamethoxazole-trimethoprim (BACTRIM DS) 800-160 MG per tablet 1 tablet  1 tablet Oral Custom Lowella Dell, MD   1 tablet at 08/24/12 2152  . DISCONTD: tacrolimus (PROGRAF) capsule 1.5 mg  1.5 mg Oral BID Lowella Dell, MD   1.5 mg at 08/26/12 1200  . DISCONTD: ursodiol (ACTIGALL) capsule 600 mg  600 mg Oral BID Lowella Dell, MD   600 mg at 08/26/12 1200  . DISCONTD: valGANciclovir (VALCYTE) 450 MG tablet TABS 450 mg  450 mg Oral BID Lowella Dell, MD   450 mg at 08/26/12 1200    OBJECTIVE: Middle-aged white male who appears tired but in no acute distress Filed Vitals:   08/27/12 1122  BP: 148/69  Pulse: 105  Temp: 98.3 F (36.8 C)  Resp: 20     Body mass index is 31.63 kg/(m^2).     ECOG FS: 2 Filed Weights   08/27/12 1122  Weight: 208 lb (94.348 kg)   Sclerae unicteric  Oropharynx clear No cervical or supraclavicular adenopathy  Lungs clear to auscultation,  no rales or rhonchi  Heart regular rate and rhythm  Abd benign, specifically +BS, nontender to palpation  MSK  no peripheral edema  Neuro: nonfocal, alert and oriented x 3    LAB RESULTS: Lab Results  Component Value Date   WBC 4.3 08/27/2012   NEUTROABS 1.6 08/27/2012   HGB 12.4* 08/27/2012   HCT 36.8* 08/27/2012   MCV 92.0 08/27/2012   PLT 191 08/27/2012      Chemistry      Component Value  Date/Time   NA 136 08/26/2012 0540   NA 136 08/19/2012 1420   K 3.7 08/26/2012 0540   K 4.2 08/19/2012 1420   CL 107 08/26/2012 0540   CL 108* 08/19/2012 1420   CO2 21 08/26/2012 0540   CO2 20* 08/19/2012 1420   BUN 8 08/26/2012 0540   BUN 18.0 08/19/2012 1420   CREATININE 1.03 08/26/2012 0540   CREATININE 1.3 08/19/2012 1420      Component Value Date/Time   CALCIUM 8.2* 08/26/2012 0540   ALKPHOS 84 08/26/2012 0540   ALKPHOS 90 08/19/2012 1420   AST 18 08/26/2012 0540   AST 17 08/19/2012 1420   ALT 7 08/26/2012 0540   ALT 11 08/19/2012 1420   BILITOT 0.2* 08/26/2012 0540   BILITOT 0.50  08/19/2012 1420       Lipid Panel     Component Value Date/Time   CHOL 225* 08/20/2012 1341   TRIG 301* 08/20/2012 1341   HDL 23* 08/20/2012 1341   CHOLHDL 9.8 08/20/2012 1341   VLDL 60* 08/20/2012 1341   LDLCALC 142* 08/20/2012 1341   Tacrolimus Level  Was 9.7 on 07/28/12 (trough level).  Both on 8/16 and 8/21 level was 6.9  Magnesium was 1.3 on 08/26/2012.  CMV DNA on 07/14/2012:  <200, then was elevated at 875 on 07/21/12, then >23K 8/14, with oral gancyclovir started 8/16; results from 8/21 showed a decrease to 3528.  On 08/24/2012:  IgG = 752;   IgA < 6;   IgM  <5; received IVIG 8/16  STUDIES: Review of outside studies obtained in [place] included a CT of the head and sinuses with and without contrast 05/24/2012 showing normal brain and clear paranasal sinuses CT of the neck on 05/21/2012 showed no cervical lymphadenopathy or mass with unremarkable submandibular and parotid glands. The thyroid was normal. CT of the chest on the same day showed significant decrease in the large lymph nodes previously noted, with a left internal mammary lymph node measuring 9 mm and a left axillary lymph node at 10 mm. Largest pretracheal lymph node measured 10 mm. Small hypoattenuating liver lesions were unchanged as compared to prior and his spleen size was stable as well. Bone density may 20 02/11/2012 showed normal T score is brain MRI  03/07/2012 was normal     ASSESSMENT: 66 y.o. [place] man with a history of well-differentiated lymphocytic lymphoma/ chronic lymphoid leukemia initially diagnosed in 2000, not requiring intervention until 2006; with multiple chromosomal abnormalities.  His treatment history is as follows:  (1) fludarabine/cyclophosphamide/rituximab x5 completed May 2007.  (2) rituximab for 8 doses October 2010, with partial response  (3) Leustatin and ofatumumab weekly x8 July to September 2011 followed by maintenance ofatumumab maintenance ofatumumab every 2 months, with initial response but rising counts September 2012  (4) status-post unrelated donor stem-cell transplant 02/24/2012 at [facility]  (a) conditioning regimen consisted of fludarabine + TBI at 200 cGy, followed by rituximab x27;  (b) CMV reactivation x2 (patient CMV positive, donor negative), s/p ganciclovir treatment; 3d reactivation August 2013  (c) GVHD: involving gut and skin, treated with steroids, tacrolimus and MMF  (d) atrial fibrillation: resolved on amiodarone  (e) steroid-induced myopathy: improving  (f) hypomagnesemia  (g) hypogammaglobulinemia: s/p IVIG 07/09/2012  (h) elevated triglycerides (606 on 07/14/2012)   PLAN: ongoing issues include  (1) chronic GVHD prophylaxis: currently has tapered off prednisone and MMF (CellCept); continues on tacrolimus (Prograf), ursodiol and budesonide--- we are checking tacrolimus levels weekly until stable, then will broaden to monthly  (2) CMV reactivation: started on oral gancyclovir 08/16, repeat PCR weekly;  (3) hypogammaglobulinemia: received IgG at 663 mg/kg 08/18/2012;   (4)   Hypomagnesemia:  Continuing with IV replacement 3 times weekly  (5) immunocompromise: continue prophylaxis with zovirax, diflucan, levofloxacin and Bactrim;  Receives Neupogen, 480 mcg daily,  if ANC < or = 1.0, continuing until ANC > 5.  (6)  CGVHD assessment weekly initially, then at broadening  intervals, with CBC, CMET, magnesium weekly, IgG, CMV PCR,  and fasting lipids monthly.  (7) due for restaging studies including CT of the chest/ abdomen and pelvis and bone marrow biopsy, delayed due to hospitalization on 9/4 and 9/5 and being rescheduled for next week.  (8) Dehydration:  Receives supportive IV fluids every Monday, Wednesday, Friday  and Saturday  This case was reviewed with Dr. Darnelle Catalan today who also spoke extensively with the patient and his wife. We will continue with our current plan. We will proceed with daily Neupogen today and tomorrow, and we'll recheck the CBC on Monday, September 9. We will stop the Neupogen, as noted above, once ANC is greater than 5.  We are rechecking CMV PCR today. Merik is anxious to stop taking the ganciclovir. He feels is making him nauseous, and Dr. Darnelle Catalan have suggested Compazine, 5 mg 3 times daily with meals. We are calling in the Compazine, and are also refilling his Levaquin, ganciclovir, and tacrolimus.  I will see [place] for followup next week on 09/01/2012. They know to call meanwhile if any changes or problems.   Zollie Scale    08/27/2012

## 2012-08-27 NOTE — Telephone Encounter (Signed)
Return to lab today before infusion; Reschedule CT's to 09/02/12 (Missed appt on 9/4 due to hospitalization); 1 hr IV fluids on 9/14  Ct scans 09-02-2012 arrival 7:15 am

## 2012-08-27 NOTE — Addendum Note (Signed)
Addended by: Catalina Gravel on: 08/27/2012 01:20 PM   Modules accepted: Orders

## 2012-08-28 ENCOUNTER — Ambulatory Visit (HOSPITAL_BASED_OUTPATIENT_CLINIC_OR_DEPARTMENT_OTHER): Payer: BC Managed Care – PPO

## 2012-08-28 VITALS — BP 144/86 | HR 97 | Temp 97.3°F | Resp 18

## 2012-08-28 DIAGNOSIS — E860 Dehydration: Secondary | ICD-10-CM

## 2012-08-28 DIAGNOSIS — D709 Neutropenia, unspecified: Secondary | ICD-10-CM

## 2012-08-28 LAB — IGG, IGA, IGM: IgG (Immunoglobin G), Serum: 655 mg/dL (ref 650–1600)

## 2012-08-28 MED ORDER — ALTEPLASE 2 MG IJ SOLR
2.0000 mg | Freq: Once | INTRAMUSCULAR | Status: DC | PRN
  Filled 2012-08-28: qty 2

## 2012-08-28 MED ORDER — SODIUM CHLORIDE 0.9 % IV SOLN
INTRAVENOUS | Status: AC
  Administered 2012-08-28: 1000 mL via INTRAVENOUS

## 2012-08-28 MED ORDER — FILGRASTIM 480 MCG/0.8ML IJ SOLN
480.0000 ug | Freq: Once | INTRAMUSCULAR | Status: AC
  Administered 2012-08-28: 480 ug via SUBCUTANEOUS

## 2012-08-28 MED ORDER — SODIUM CHLORIDE 0.9 % IV SOLN: INTRAVENOUS | Status: AC

## 2012-08-30 ENCOUNTER — Other Ambulatory Visit: Payer: Self-pay | Admitting: Radiology

## 2012-08-30 ENCOUNTER — Other Ambulatory Visit: Payer: Self-pay | Admitting: Physician Assistant

## 2012-08-30 ENCOUNTER — Other Ambulatory Visit (HOSPITAL_BASED_OUTPATIENT_CLINIC_OR_DEPARTMENT_OTHER): Payer: BC Managed Care – PPO

## 2012-08-30 ENCOUNTER — Other Ambulatory Visit: Payer: Self-pay | Admitting: *Deleted

## 2012-08-30 ENCOUNTER — Ambulatory Visit (HOSPITAL_BASED_OUTPATIENT_CLINIC_OR_DEPARTMENT_OTHER): Payer: BC Managed Care – PPO

## 2012-08-30 VITALS — BP 152/89 | HR 97 | Temp 98.2°F | Resp 20

## 2012-08-30 DIAGNOSIS — C9110 Chronic lymphocytic leukemia of B-cell type not having achieved remission: Secondary | ICD-10-CM

## 2012-08-30 DIAGNOSIS — Z9489 Other transplanted organ and tissue status: Secondary | ICD-10-CM

## 2012-08-30 DIAGNOSIS — C8599 Non-Hodgkin lymphoma, unspecified, extranodal and solid organ sites: Secondary | ICD-10-CM

## 2012-08-30 DIAGNOSIS — E860 Dehydration: Secondary | ICD-10-CM

## 2012-08-30 DIAGNOSIS — D89811 Chronic graft-versus-host disease: Secondary | ICD-10-CM

## 2012-08-30 LAB — CK: Total CK: 26 U/L (ref 7–232)

## 2012-08-30 LAB — CBC WITH DIFFERENTIAL/PLATELET
BASO%: 0.5 % (ref 0.0–2.0)
EOS%: 0.2 % (ref 0.0–7.0)
MCH: 31.6 pg (ref 27.2–33.4)
MCHC: 34.9 g/dL (ref 32.0–36.0)
RBC: 3.96 10*6/uL — ABNORMAL LOW (ref 4.20–5.82)
RDW: 16 % — ABNORMAL HIGH (ref 11.0–14.6)
lymph#: 2.3 10*3/uL (ref 0.9–3.3)

## 2012-08-30 MED ORDER — SODIUM CHLORIDE 0.9 % IV SOLN
Freq: Once | INTRAVENOUS | Status: AC
  Administered 2012-08-30: 17:00:00 via INTRAVENOUS
  Filled 2012-08-30: qty 100

## 2012-08-30 MED ORDER — SODIUM CHLORIDE 0.9 % IV SOLN
INTRAVENOUS | Status: DC
  Filled 2012-08-30: qty 1000

## 2012-08-30 MED ORDER — SODIUM CHLORIDE 0.9 % IV SOLN
1000.0000 mL | Freq: Once | INTRAVENOUS | Status: AC
  Administered 2012-08-30: 1000 mL via INTRAVENOUS

## 2012-08-30 NOTE — Patient Instructions (Addendum)
[facility] Discharge Instructions for Patients Receiving IV FLUIDS with magnesium   To help prevent nausea and vomiting after your treatment, we encourage you to take your nausea medication as directed  If you develop nausea and vomiting that is not controlled by your nausea medication, call the clinic. If it is after clinic hours your family physician or the after hours number for the clinic or go to the Emergency Department.   BELOW ARE SYMPTOMS THAT SHOULD BE REPORTED IMMEDIATELY:  *FEVER GREATER THAN 100.5 F  *CHILLS WITH OR WITHOUT FEVER  NAUSEA AND VOMITING THAT IS NOT CONTROLLED WITH YOUR NAUSEA MEDICATION  *UNUSUAL SHORTNESS OF BREATH  *UNUSUAL BRUISING OR BLEEDING  TENDERNESS IN MOUTH AND THROAT WITH OR WITHOUT PRESENCE OF ULCERS  *URINARY PROBLEMS  *BOWEL PROBLEMS  UNUSUAL RASH Items with * indicate a potential emergency and should be followed up as soon as possible.  One of the nurses will contact you 24 hours after your treatment. Please let the nurse know about any problems that you may have experienced. Feel free to call the clinic you have any questions or concerns. The clinic phone number is [phone].   I have been informed and understand all the instructions given to me. I know to contact the clinic, my physician, or go to the Emergency Department if any problems should occur. I do not have any questions at this time, but understand that I may call the clinic during office hours   should I have any questions or need assistance in obtaining follow up care.    __________________________________________  _____________  __________ Signature of Patient or Authorized Representative            Date                   Time    __________________________________________ Nurse's Signature

## 2012-08-31 ENCOUNTER — Other Ambulatory Visit: Payer: Self-pay | Admitting: *Deleted

## 2012-08-31 ENCOUNTER — Other Ambulatory Visit: Payer: Self-pay | Admitting: Radiology

## 2012-09-01 ENCOUNTER — Other Ambulatory Visit: Payer: Self-pay | Admitting: *Deleted

## 2012-09-01 ENCOUNTER — Encounter: Payer: Self-pay | Admitting: Physician Assistant

## 2012-09-01 ENCOUNTER — Other Ambulatory Visit (HOSPITAL_BASED_OUTPATIENT_CLINIC_OR_DEPARTMENT_OTHER): Payer: BC Managed Care – PPO

## 2012-09-01 ENCOUNTER — Ambulatory Visit (HOSPITAL_BASED_OUTPATIENT_CLINIC_OR_DEPARTMENT_OTHER): Payer: BC Managed Care – PPO | Admitting: Physician Assistant

## 2012-09-01 ENCOUNTER — Ambulatory Visit (HOSPITAL_COMMUNITY)
Admit: 2012-09-01 | Discharge: 2012-09-01 | Disposition: A | Discharge status: Home / Self Care | Payer: BC Managed Care – PPO | Attending: Oncology | Admitting: Oncology

## 2012-09-01 ENCOUNTER — Encounter (HOSPITAL_COMMUNITY): Payer: Self-pay

## 2012-09-01 ENCOUNTER — Ambulatory Visit: Payer: Self-pay

## 2012-09-01 VITALS — BP 157/88 | HR 92 | Temp 98.3°F | Resp 20 | Ht 68.0 in | Wt 206.1 lb

## 2012-09-01 DIAGNOSIS — D696 Thrombocytopenia, unspecified: Secondary | ICD-10-CM | POA: Insufficient documentation

## 2012-09-01 DIAGNOSIS — B259 Cytomegaloviral disease, unspecified: Secondary | ICD-10-CM

## 2012-09-01 DIAGNOSIS — C9110 Chronic lymphocytic leukemia of B-cell type not having achieved remission: Secondary | ICD-10-CM | POA: Insufficient documentation

## 2012-09-01 DIAGNOSIS — E860 Dehydration: Secondary | ICD-10-CM

## 2012-09-01 DIAGNOSIS — D89811 Chronic graft-versus-host disease: Secondary | ICD-10-CM

## 2012-09-01 DIAGNOSIS — T865 Complications of stem cell transplant: Secondary | ICD-10-CM

## 2012-09-01 DIAGNOSIS — D649 Anemia, unspecified: Secondary | ICD-10-CM | POA: Insufficient documentation

## 2012-09-01 DIAGNOSIS — Z9489 Other transplanted organ and tissue status: Secondary | ICD-10-CM

## 2012-09-01 DIAGNOSIS — R894 Abnormal immunological findings in specimens from other organs, systems and tissues: Secondary | ICD-10-CM

## 2012-09-01 DIAGNOSIS — R768 Other specified abnormal immunological findings in serum: Secondary | ICD-10-CM

## 2012-09-01 DIAGNOSIS — C9421 Acute megakaryoblastic leukemia, in remission: Secondary | ICD-10-CM

## 2012-09-01 LAB — COMPREHENSIVE METABOLIC PANEL (CC13)
Albumin: 2.8 g/dL — ABNORMAL LOW (ref 3.5–5.0)
Alkaline Phosphatase: 128 U/L (ref 40–150)
BUN: 12 mg/dL (ref 7.0–26.0)
Calcium: 8 mg/dL — ABNORMAL LOW (ref 8.4–10.4)
Glucose: 92 mg/dl (ref 70–99)
Potassium: 3.4 mEq/L — ABNORMAL LOW (ref 3.5–5.1)

## 2012-09-01 LAB — CBC
HCT: 30.3 % — ABNORMAL LOW (ref 39.0–52.0)
Hemoglobin: 10.4 g/dL — ABNORMAL LOW (ref 13.0–17.0)
MCHC: 34.3 g/dL (ref 30.0–36.0)

## 2012-09-01 LAB — IGG, IGA, IGM: IgG (Immunoglobin G), Serum: 534 mg/dL — ABNORMAL LOW (ref 650–1600)

## 2012-09-01 LAB — MAGNESIUM (CC13): Magnesium: 1.9 mg/dl (ref 1.5–2.5)

## 2012-09-01 MED ORDER — MIDAZOLAM HCL 5 MG/5ML IJ SOLN
INTRAMUSCULAR | Status: AC | PRN
  Administered 2012-09-01: 2 mg via INTRAVENOUS

## 2012-09-01 MED ORDER — MIDAZOLAM HCL 2 MG/2ML IJ SOLN
INTRAMUSCULAR | Status: AC
  Filled 2012-09-01: qty 4

## 2012-09-01 MED ORDER — FENTANYL CITRATE 0.05 MG/ML IJ SOLN
INTRAMUSCULAR | Status: AC | PRN
  Administered 2012-09-01: 100 ug via INTRAVENOUS

## 2012-09-01 MED ORDER — FENTANYL CITRATE 0.05 MG/ML IJ SOLN
INTRAMUSCULAR | Status: AC
  Filled 2012-09-01: qty 4

## 2012-09-01 MED ORDER — ONDANSETRON 8 MG PO TBDP: 8.0000 mg | ORAL_TABLET | Freq: Two times a day (BID) | ORAL | Status: DC

## 2012-09-01 MED ORDER — SODIUM CHLORIDE 0.9 % IV SOLN
INTRAVENOUS | Status: DC
  Administered 2012-09-01: 09:00:00 via INTRAVENOUS

## 2012-09-01 MED ORDER — HYDROCODONE-ACETAMINOPHEN 5-325 MG PO TABS
1.0000 | ORAL_TABLET | ORAL | Status: DC | PRN
  Filled 2012-09-01: qty 2

## 2012-09-01 MED ORDER — SODIUM CHLORIDE 0.9 % IV SOLN
Freq: Once | INTRAVENOUS | Status: AC
  Administered 2012-09-01: 12:00:00 via INTRAVENOUS
  Filled 2012-09-01: qty 1000

## 2012-09-01 NOTE — Progress Notes (Signed)
ID: Timothy [name]   DOB: December 25, 1945  MR#: [id number]  WUJ#:[id number]  HISTORY OF PRESENT ILLNESS: We have very complete records from Dr. Sydnee Levans in [place], and in summary:  The patient was initially diagnosed in August 2000, with a white cell count of 23,600, but normal hemoglobin and platelets, and no significant symptomatology. Over the next several years his white cell count drifted up, and he eventually developed some symptoms of night sweats in particular, leading to treatment with fludarabine, Cytoxan and rituxan for five cycles given between December 2006 and May 2007.  We have CT scans from June 2006, November 2006 and April 2007, and comparing the November 2006 and April 2007 scans, there was near complete response. He had subsequent therapy in [place] as detailed below, but with decreased response, leading to allogeneic stem-cell transplant at the [facility] 02/24/2012.  INTERVAL HISTORY: Timothy [name] returns with his wife Timothy [name]  for followup, status post allogenic stem cell transplant for CLL in March 2013. Interval history is notable for bone marrow biopsy earlier today. He tolerated the procedure well. He was fortunate that they were able to give him supportive IV fluids and IV magnesium while he was in  Short Stay, so that will shorten his visit in our clinic today.  Timothy [name] received his last injection of Neupogen on Saturday, September 7. By Monday, September 9, his neutrophils had increased to 10.8. He has had no fevers, chills, or night sweats.   REVIEW OF SYSTEMS: Timothy [name] is still very weak and tired, although he feels a little stronger today than he did last week.Marland Kitchen  He is trying to start a daily walking program to regain his stamina, and several family members have agreed to come over and walk with him. He continues on ganciclovir which "makes him feel bad" and this is really his biggest complaint today. The ganciclovir causes nausea and decreased appetite. He  develops a dry heaves a couple of times daily. Zofran helps, and he requests a refill today. His diarrhea has resolved. He had a normal bowel movement yesterday.  He denies any abdominal pain.  Fortunately, he has no mouth ulcers or oral sensitivity of current time. He is noted no change in his urinary habits. He continues to have shortness of breath with exertion, but denies chest pain or palpitations. No abnormal headaches or dizziness.  He has some occasional lower back pain which is mild. He denies any additional myalgias, arthralgias, or bony pain. He has only mild swelling in his feet and ankles which is stable.  Timothy [name] brought to my attention a darkened area of skin on your ease lower back and hips. She tells me this was present several months ago and [place], and was evaluated at that time. It has not changed or worsened. She does want to make sure it is documented that he has not fallen or developed any new bruising.  A detailed review of systems is otherwise stable and noncontributory.   PAST MEDICAL HISTORY: Past Medical History  Diagnosis Date  . 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 Diverticular disease   . Hyperlipidemia   . Obesity   . Hypertension   . Hiatal hernia   . CMV (cytomegalovirus) antibody positive     pre-transplant, with seroconversion x2 pst-transplant  . Right bundle branch block     pre-transplant  . CKD (chronic kidney disease) stage 2, GFR 60-89 ml/min   . Pancytopenia   . Steroid-induced diabetes   .  Atrial fibrillation     post-transplant  . Myopathy   . 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 Leukemia, chronic lymphoid   . 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 Tonsillectomy and adenoidectomy   . Bone marrow transplant     FAMILY HISTORY Family History  Problem Relation Age of Onset  . Cancer Father    The patient's father died from  complications of chronic lymphocytic leukemia at the age of 35.  It had been diagnosed seven years before when he was 67.  The patient is enrolled in a familial chronic lymphocytic leukemia study out of the [facility].  The patient's mother is 40, alive, unfortunately suffering with dementia, and he has a brother, 93, who is otherwise in fair health.   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  His wife of >40 years, Timothy [name], is a homemaker.  Their daughter, Timothy [name], lives in [place].  She also is a Futures trader.  The patient has an 40 year old grandson and an 28-year-old granddaughter, and that is really the main reason is moved to this area.  He is a International aid/development worker.     ADVANCED DIRECTIVES:  HEALTH MAINTENANCE: History  Substance Use Topics  . Smoking status: Never Smoker   . Smokeless tobacco: Never Used  . Alcohol Use: No     Colonoscopy:  PAP:  Bone density:  Lipid panel:  Allergies  Allergen Reactions  . Benadryl (Diphenhydramine Hcl)     "Restless leg syndrome"    Current Outpatient Prescriptions  Medication Sig Dispense Refill  . Alum & Mag Hydroxide-Simeth (MAGIC MOUTHWASH W/LIDOCAINE) SOLN Take 5 mLs by mouth 4 (four) times daily as needed. Mouth sores      . antiseptic oral rinse (BIOTENE) LIQD 15 mLs by Mouth Rinse route 2 times daily at 12 noon and 4 pm.  237 mL  6  . Beclomethasone Dipropionate POWD Take 2 mg by mouth 2 (two) times daily. In corn oil emulsion to equal 2mg  per ml  On tapered dose Take with food      . Calcium Citrate (CITRACAL PO) Take 1,200 mg by mouth 2 (two) times daily.      . chlorhexidine (PERIDEX) 0.12 % solution Use as directed 15 mLs in the mouth or throat 2 (two) times daily.  120 mL  6  . cholecalciferol (VITAMIN D) 1000 UNITS tablet Take 1,000 Units by mouth daily.      Marland Kitchen dexamethasone 0.5 MG/5ML elixir  Take 1 mg by mouth daily. Rinse mouth 4-6 times daily and spit out      . dextrose 5 % SOLN 50 mL with magnesium sulfate 50 % SOLN 1 g Inject 1 g into the vein once. Gets this at [facility]      . filgrastim (NEUPOGEN) 480 MCG/1.6ML injection Inject 480 mcg into the skin once.      . labetalol (NORMODYNE) 200 MG tablet Take 200 mg by mouth 2 (two) times daily.      Marland Kitchen levofloxacin (LEVAQUIN) 750 MG tablet Take 750 mg by mouth daily.      Marland Kitchen lisinopril (PRINIVIL,ZESTRIL) 10 MG tablet Take 10 mg by mouth at bedtime.      Marland Kitchen LORazepam (ATIVAN) 0.5 MG tablet Take 0.5 mg by mouth every 6 (six) hours as needed.  anxiety      . Multiple Vitamins-Minerals (CENTRUM SILVER ADULT 50+) TABS Take 1 tablet by mouth daily.      . Omega-3 Fatty Acids (FISH OIL) 300 MG CAPS Take 600 mg by mouth daily.      Marland Kitchen omeprazole (PRILOSEC) 20 MG capsule Take 20 mg by mouth daily.      . ondansetron (ZOFRAN-ODT) 8 MG disintegrating tablet Take 1 tablet (8 mg total) by mouth 2 (two) times daily.  30 tablet  1  . oxycodone (OXY-IR) 5 MG capsule Take 5 mg by mouth every 6 (six) hours as needed. Pain      . prochlorperazine (COMPAZINE) 10 MG tablet Take 10 mg by mouth every 6 (six) hours as needed. Nausea      . sertraline (ZOLOFT) 50 MG tablet Take 50-100 mg by mouth daily. Takes 50 mg one day and 100 mg the next day      . sulfamethoxazole-trimethoprim (BACTRIM DS) 800-160 MG per tablet Take 1 tablet by mouth 2 (two) times daily.       . tacrolimus (PROGRAF) 0.5 MG capsule Take 1.5 mg by mouth 2 (two) times daily. Takes with 1 mg capsule      . tacrolimus (PROGRAF) 1 MG capsule Take 1.5 mg by mouth 2 (two) times daily. Takes with a 0.5 mg capsule      . ursodiol (ACTIGALL) 300 MG capsule Take 600 mg by mouth 2 (two) times daily.      . valGANciclovir (VALCYTE) 450 MG tablet Take 450 mg by mouth 2 (two) times daily.       No current facility-administered medications for this visit.   Facility-Administered Medications Ordered in Other  Visits  Medication Dose Route Frequency Provider Last Rate Last Dose  . 0.9 %  sodium chloride infusion   Intravenous Continuous D Jeananne Rama, PA 20 mL/hr at 09/01/12 0850    . fentaNYL (SUBLIMAZE) 0.05 MG/ML injection           . fentaNYL (SUBLIMAZE) injection   Intravenous PRN Casimiro Needle T. Shick, MD   100 mcg at 09/01/12 0929  . filgrastim (NEUPOGEN) injection 480 mcg  480 mcg Subcutaneous Once Lowella Dell, MD      . HYDROcodone-acetaminophen (NORCO/VICODIN) 5-325 MG per tablet 1-2 tablet  1-2 tablet Oral Q4H PRN Inocente Salles. Shick, MD      . midazolam (VERSED) 2 MG/2ML injection           . midazolam (VERSED) 5 MG/5ML injection   Intravenous PRN Michael T. Shick, MD   2 mg at 09/01/12 0928  . sodium chloride 0.9 % 1,000 mL with magnesium sulfate 1 g infusion   Intravenous Once Catalina Gravel, PA 500 mL/hr at 09/01/12 1208    . sodium chloride 0.9 % injection 10 mL  10 mL Intracatheter PRN Lowella Dell, MD   10 mL at 08/11/12 1605  . sodium chloride 0.9 % injection 10 mL  10 mL Intravenous PRN Lowella Dell, MD   10 mL at 08/11/12 1606    OBJECTIVE: Middle-aged white male who appears tired but in no acute distress Filed Vitals:   09/01/12 1435  BP: 157/88  Pulse: 92  Temp: 98.3 F (36.8 C)  Resp: 20     Body mass index is 31.34 kg/(m^2).     ECOG FS: 2 Filed Weights   09/01/12 1435  Weight: 206 lb 1.6 oz (93.486 kg)   Sclerae unicteric  Oropharynx clear No cervical or supraclavicular  adenopathy  Lungs clear to auscultation,  no rales or rhonchi  Heart regular rate and rhythm  Abd benign, specifically +BS, soft, nontender to palpation  MSK  no peripheral edema  Neuro: nonfocal, alert and oriented x 3 Skin:  There are symmetrical darkened patches of skin on teres lower back, spreading across his lower back to both the right and the left equally. This almost appears like ecchymoses, but perhaps not quite as dark.    LAB RESULTS: Lab Results  Component Value Date    WBC 6.0 09/01/2012   NEUTROABS 10.8* 08/30/2012   HGB 10.4* 09/01/2012   HCT 30.3* 09/01/2012   MCV 90.7 09/01/2012   PLT 124* 09/01/2012      Chemistry      Component Value Date/Time   NA 136 08/27/2012 1145   NA 136 08/26/2012 0540   K 3.6 08/27/2012 1145   K 3.7 08/26/2012 0540   CL 107 08/27/2012 1145   CL 107 08/26/2012 0540   CO2 16* 08/27/2012 1145   CO2 21 08/26/2012 0540   BUN 8.0 08/27/2012 1145   BUN 8 08/26/2012 0540   CREATININE 1.0 08/27/2012 1145   CREATININE 1.03 08/26/2012 0540      Component Value Date/Time   CALCIUM 8.6 08/27/2012 1145   CALCIUM 8.2* 08/26/2012 0540   ALKPHOS 119 08/27/2012 1145   ALKPHOS 84 08/26/2012 0540   AST 26 08/27/2012 1145   AST 18 08/26/2012 0540   ALT 9 08/27/2012 1145   ALT 7 08/26/2012 0540   BILITOT 0.50 08/27/2012 1145   BILITOT 0.2* 08/26/2012 0540     CBC 09/01/12:   WBC = 6.0; Hgb 10.4; Hct = 30.3; Plt = 124000  Lipid Panel     Component Value Date/Time   CHOL 225* 08/20/2012 1341   TRIG 301* 08/20/2012 1341   HDL 23* 08/20/2012 1341   CHOLHDL 9.8 08/20/2012 1341   VLDL 60* 08/20/2012 1341   LDLCALC 142* 08/20/2012 1341   Tacrolimus Level  Was 9.7 on 07/28/12 (trough level).  Both on 8/16 and 8/21 level was 6.9  Magnesium was 1.6 on 08/30/2012.  CMV DNA on 07/14/2012:  <200, then was elevated at 875 on 07/21/12, then >23K 8/14, with oral gancyclovir started 8/16; results from 8/21 showed a decrease to 3528.  On 08/27/2012:  IgG = 655;   IgA < 22;   IgM  <17; received IVIG 8/16  STUDIES: Review of outside studies obtained in [place] included a CT of the head and sinuses with and without contrast 05/24/2012 showing normal brain and clear paranasal sinuses CT of the neck on 05/21/2012 showed no cervical lymphadenopathy or mass with unremarkable submandibular and parotid glands. The thyroid was normal. CT of the chest on the same day showed significant decrease in the large lymph nodes previously noted, with a left internal mammary lymph node measuring 9 mm and a left  axillary lymph node at 10 mm. Largest pretracheal lymph node measured 10 mm. Small hypoattenuating liver lesions were unchanged as compared to prior and his spleen size was stable as well. Bone density may 20 02/11/2012 showed normal T score is brain MRI 03/07/2012 was normal     ASSESSMENT: 66 y.o. [place] man with a history of well-differentiated lymphocytic lymphoma/ chronic lymphoid leukemia initially diagnosed in 2000, not requiring intervention until 2006; with multiple chromosomal abnormalities.  His treatment history is as follows:  (1) fludarabine/cyclophosphamide/rituximab x5 completed May 2007.  (2) rituximab for 8 doses October 2010, with partial response  (  3) Leustatin and ofatumumab weekly x8 July to September 2011 followed by maintenance ofatumumab maintenance ofatumumab every 2 months, with initial response but rising counts September 2012  (4) status-post unrelated donor stem-cell transplant [facility]/24/2012 at [facility]  (a) conditioning regimen consisted of fludarabine + TBI at 200 cGy, followed by rituximab x27;  (b) CMV reactivation x2 (patient CMV positive, donor negative), s/p ganciclovir treatment; 3d reactivation August 2013  (c) GVHD: involving gut and skin, treated with steroids, tacrolimus and MMF  (d) atrial fibrillation: resolved on amiodarone  (e) steroid-induced myopathy: improving  (f) hypomagnesemia  (g) hypogammaglobulinemia: s/p IVIG 07/09/2012  (h) elevated triglycerides (606 on 07/14/2012)   PLAN: ongoing issues include  (1) chronic GVHD prophylaxis: currently has tapered off prednisone and MMF (CellCept); continues on tacrolimus (Prograf), ursodiol and budesonide--- we are checking tacrolimus levels weekly until stable, then will broaden to monthly  (2) CMV reactivation: started on oral gancyclovir 08/16, repeat PCR weekly;  (3) hypogammaglobulinemia: received IgG at 663 mg/kg 08/18/2012;   (4)   Hypomagnesemia:  Continuing with IV replacement 3  times weekly  (5) immunocompromise: continue prophylaxis with zovirax, diflucan, levofloxacin and Bactrim;  Receives Neupogen, 480 mcg daily,  if ANC < or = 1.0, continuing until ANC > 5.  (6)  CGVHD assessment weekly initially, then at broadening intervals, with CBC, CMET, magnesium weekly, IgG, CMV PCR,  and fasting lipids monthly.  (7) due for restaging studies including CT of the chest/ abdomen and pelvis and bone marrow biopsy,   (8) Dehydration:  Receives supportive IV fluids every Monday, Wednesday, Friday and Saturday  Timothy [name] does appear a little stronger today. He will continue to try increasing his activity to strengthen his legs and regain some strength and stamina. We will continue with supportive IV fluids on Mondays, Wednesdays, Fridays, and Saturdays. He will also receive IV magnesium on Mondays, Wednesdays, and Fridays. He'll return next week to followup with Dr. Darnelle Catalan on September 20. We will continue to follow his CMV weekly on Wednesdays, and Dr. Darnelle Catalan will discuss how long after a negative reading he will need to continue the ganciclovir.  We have also contacted the clinic in [place] to request a tapering schedule for the tacrolimus. They are faxing that to [name], and we will contact Timothy [name] with this information.   As always, they know to call with any changes or problems.   Timothy [name]    09/01/2012

## 2012-09-01 NOTE — H&P (Signed)
Timothy [name] is an 66 y.o. male.   Chief Complaint: CLL s/p transplant. Patient presents today for BM needle biopsy for staging evaluation.   HPI: see oncology note below :  Lowella Dell, MD Physician Signed Oncology H&P 08/24/2012 5:50 PM  Related encounter: Admission (Discharged) from 08/24/2012 in [facility]  ID: [name]   DOB: May 27, 1946  MR#: [id number]  ION#:[id number]  PCP: Kari Baars, MD GYN:   SU:   OTHER MD:   HISTORY OF PRESENT ILLNESS: We have very complete records from Dr. Sydnee Levans in [place], and in summary: The patient was initially diagnosed in August 2000, with a white cell count of 23,600, but normal hemoglobin and platelets, and no significant symptomatology. Over the next several years his white cell count drifted up, and he eventually developed some symptoms of night sweats in particular, leading to treatment with fludarabine, Cytoxan and rituxan for five cycles given between December 2006 and May 2007. We have CT scans from June 2006, November 2006 and April 2007, and comparing the November 2006 and April 2007 scans, there was near complete response. He had subsequent therapy in [place] as detailed below, but with decreased response, leading to allogeneic stem-cell transplant at the [facility] 02/24/2012.  INTERVAL HISTORY: Timothy [name] came to the office today after 2 days with  watery diarrhea. He had an uncontrolled bowel movement in the office, requiring him to be changed to scrub's.  REVIEW OF SYSTEMS: He has had nausea and vomiting for 3 or 4 days. He has not been able to eat anything in that time because everything he 2 says makes him a one to vomit. He has had no unusual headaches, visual changes, or dizziness, although he says her balance has been poor. He denies cough, phlegm production, or pleurisy. There has been no dysuria or hematuria. He is not aware of fever. He has not had any mouth  sores that he is aware of. A detailed review of systems today was otherwise noncontributory.  PAST MEDICAL HISTORY: Past Medical History   Diagnosis  Date   .  Transplant recipient  07/12/2012   .  Chronic graft-versus-host disease     .  Diverticular disease     .  Hyperlipidemia     .  Obesity     .  Hypertension     .  Hiatal hernia     .  CMV (cytomegalovirus) antibody positive         pre-transplant, with seroconversion x2 pst-transplant   .  Right bundle branch block         pre-transplant   .  CKD (chronic kidney disease) stage 2, GFR 60-89 ml/min     .  Pancytopenia     .  Steroid-induced diabetes     .  Atrial fibrillation         post-transplant   .  Myopathy     .  Fine tremor         likely secondary to tacrolimus   .  Leukemia, chronic lymphoid     .  Chronic graft-versus-host disease       PAST SURGICAL HISTORY: Past Surgical History   Procedure  Date   .  Tonsillectomy and adenoidectomy     .  Bone marrow transplant       FAMILY HISTORY Family History   Problem  Relation  Age of Onset   .  Cancer  Father  The patient's father died from complications of chronic lymphocytic leukemia at the age of 52. It had been diagnosed seven years before when he was 53. The patient is enrolled in a familial chronic lymphocytic leukemia study out of the [employer]. The patient's mother is 67, alive, unfortunately suffering with dementia, and he has a brother, 66, who is otherwise in fair health.   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 His wife of >40 years, [name], is a homemaker. Their daughter, [name], lives in [place]. She also is a Futures trader. The patient has an 91 year old grandson and an 81-year-old granddaughter, and that is really the main reason is moved to this area. He is a International aid/development worker.                             ADVANCED DIRECTIVES:  HEALTH MAINTENANCE: History   Substance Use Topics   .  Smoking status:  Never Smoker    .  Smokeless tobacco:  Never Used   .  Alcohol Use:                   Colonoscopy:             PAP:             Bone density:             Lipid panel:    Allergies   Allergen  Reactions   .  Benadryl (Diphenhydramine Hcl)         "Restless leg syndrome"       Current Facility-Administered Medications   Medication  Dose  Route  Frequency  Provider  Last Rate  Last Dose   .  0.9 %  sodium chloride infusion     Intravenous  Continuous  Brayton El, PA            Facility-Administered Medications Ordered in Other Encounters   Medication  Dose  Route  Frequency  Provider  Last Rate  Last Dose   .  filgrastim (NEUPOGEN) injection 480 mcg   480 mcg  Subcutaneous  Once  Lowella Dell, MD         .  sodium chloride 0.9 % injection 10 mL   10 mL  Intracatheter  PRN  Lowella Dell, MD     10 mL at 08/11/12 1605   .  sodium chloride 0.9 % injection 10 mL   10 mL  Intravenous  PRN  Lowella Dell, MD     10 mL at 08/11/12 1606     OBJECTIVE: Filed Vitals:     08/24/12 1750   BP:  143/60   Pulse:  70   Temp:  98.2 F (36.8 C)   Resp:  20        ECOG FS: 2  Sclerae unicteric Oropharynx clear No cervical or supraclavicular adenopathy Lungs no rales or rhonchi Heart regular rate and rhythm Abd benign, specifically no masses, +BS, NT to palpation MSK no focal spinal tenderness, no peripheral edema Neuro: nonfocal  LAB RESULTS: Lab Results   Component  Value  Date     WBC  2.9*  08/24/2012     NEUTROABS  1.0*  08/24/2012     HGB  12.1*  08/24/2012     HCT  35.4*  08/24/2012  MCV  95.4  08/24/2012     PLT  219  08/24/2012     @LASTCHEMISTRY @    No results found for this basename: LABCA2       No components found with this basename: LABCA125     No results found for this basename: INR:1;PROTIME:1 in the last 168 hours  Urinalysis No results found for  this basename: colorurine, appearanceur, labspec, phurine, glucoseu, hgbur, bilirubinur, ketonesur, proteinur, urobilinogen, nitrite, leukocytesur     STUDIES: Dg Chest 2 View  08/04/2012  *RADIOLOGY REPORT*  Clinical Data: Fall 2 days ago with left-sided pain  CHEST - 2 VIEW  Comparison: None.  Findings: Exam demonstrates a segment of catheter tubing overlying the right chest and abdomen which appears to represent a right IJ central venous catheter with tip in the region of the right atrium. Lungs are hypoinflated with mild bibasilar opacification left worse than right which may represent a combination of a small amount of pleural fluid with atelectasis although I could not exclude infection.  There is no evidence of pneumothorax. Cardiomediastinal silhouette is within normal.  There is no definite left rib fracture.  There is mild loss of height of several lower thoracic vertebral bodies.  There are degenerative changes of the spine.  IMPRESSION: Hypoinflated lungs with mild bibasilar opacification which may represent a small amount of pleural fluid with atelectasis, although cannot exclude infection.  Mild loss of vertebral body height of several lower thoracic vertebral bodies.  Right-sided central venous catheter with tip in the region of the right atrium.  Original Report Authenticated By: Elba Barman, M.D.     ASSESSMENT: 66 y.o. s/p allogeneic transplant for chronic lymphoid leukemia, admitted with dehydration secondary to diarrhea, nausea and vomiting, in the setting of CMV reactivation; his treatment history being as follows:  (1) fludarabine/cyclophosphamide/rituximab x5 completed May 2007.   (2) rituximab for 8 doses October 2010, with partial response   (3) Leustatin and ofatumumab weekly x8 July to September 2011 followed by maintenance ofatumumab maintenance ofatumumab every 2 months, with initial response but rising counts September 2012   (4) status-post unrelated donor stem-cell  transplant 02/24/2012 at the [facility]  (a) conditioning regimen consisted of fludarabine + TBI at 200 cGy, followed by rituximab x27;   (b) CMV reactivation x2 (patient CMV positive, donor negative), s/p ganciclovir treatment; 3d reactivation August 2013   (c) GVHD: involving gut and skin, treated with steroids, tacrolimus and MMF   (d) atrial fibrillation: resolved on amiodarone   (e) steroid-induced myopathy: improving   (f) hypomagnesemia   (g) hypogammaglobulinemia: s/p IVIG 07/09/2012   (h) elevated triglycerides (606 on 07/14/2012)    PLAN: Carries diarrhea, nausea and vomiting, may all be related to his ganciclovir treatment for CMV reactivation. Alternatively this could be due to graft-versus-host disease. It could also be unrelated to the above, and possibly due to C. difficile colitis or a similar cause. The plan is to hydrate and evaluate further, while continuing his treatment, which should include treatment of his neutropenia with G-CSF, treatment of the CMV reactivation with ganciclovir as above, and of course of treatment of the nausea, vomiting, and dehydration. I am hopeful we will see a marked improvement within the next 48 hours as well as have some answers. The patient is a full code at this point.   MAGRINAT,GUSTAV C    08/24/2012      09/01/12 examination :   Past Medical History  Diagnosis Date  . 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 Diverticular disease   . Hyperlipidemia   . Obesity   . Hypertension   . Hiatal hernia   . CMV (cytomegalovirus) antibody positive     pre-transplant, with seroconversion x2 pst-transplant  . Right bundle branch block     pre-transplant  . CKD (chronic kidney disease) stage 2, GFR 60-89 ml/min   . Pancytopenia   . Steroid-induced diabetes   . Atrial fibrillation     post-transplant  . Myopathy   . 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 Leukemia, chronic lymphoid   . Chronic  graft-versus-host disease     Past Surgical History  Procedure Date  . Tonsillectomy and adenoidectomy   . Bone marrow transplant     Family History  Problem Relation Age of Onset  . Cancer Father    Social History:  reports that he has never smoked. He has never used smokeless tobacco. He reports that he does not drink alcohol or use illicit drugs.  Allergies:  Allergies  Allergen Reactions  . Benadryl (Diphenhydramine Hcl)     "Restless leg syndrome"     Medication List     As of 09/01/2012  8:34 AM    ASK your doctor about these medications         antiseptic oral rinse Liqd   15 mLs by Mouth Rinse route 2 times daily at 12 noon and 4 pm.      Beclomethasone Dipropionate Powd   Take 2 mg by mouth 2 (two) times daily. In corn oil emulsion to equal 2mg  per ml   On tapered dose  Take with food      CENTRUM SILVER ADULT 50+ Tabs   Take 1 tablet by mouth daily.      chlorhexidine 0.12 % solution   Commonly known as: PERIDEX   Use as directed 15 mLs in the mouth or throat 2 (two) times daily.      cholecalciferol 1000 UNITS tablet   Commonly known as: VITAMIN D   Take 1,000 Units by mouth daily.      CITRACAL PO   Take 1,200 mg by mouth 2 (two) times daily.      dexamethasone 0.5 MG/5ML elixir   Take 1 mg by mouth daily. Rinse mouth 4-6 times daily and spit out      dextrose 5 % SOLN 50 mL with magnesium sulfate 50 % SOLN 1 g   Inject 1 g into the vein once. Gets [facility] at [facility]      Fish Oil 300 MG Caps   Take 600 mg by mouth daily.      levofloxacin 750 MG tablet   Commonly known as: LEVAQUIN   Take 750 mg by mouth daily.      lisinopril 10 MG tablet   Commonly known as: PRINIVIL,ZESTRIL   Take 10 mg by mouth at bedtime.      loratadine 10 MG tablet   Commonly known as: CLARITIN   Take 10 mg by mouth daily.      LORazepam 0.5 MG tablet   Commonly known as: ATIVAN   Take 0.5 mg by mouth every 6 (six) hours as needed. anxiety      magic mouthwash  w/lidocaine Soln   Take 5 mLs by mouth 4 (four) times daily as needed (as needed for mouth discomfort).      magic mouthwash w/lidocaine Soln   Take 5 mLs by mouth 4 (four) times daily  as needed. Mouth sores      NEUPOGEN 480 MCG/1.6ML injection   Generic drug: filgrastim   Inject 480 mcg into the skin once.      omeprazole 20 MG capsule   Commonly known as: PRILOSEC   Take 20 mg by mouth daily.      ondansetron 8 MG disintegrating tablet   Commonly known as: ZOFRAN-ODT   Take 8 mg by mouth 2 (two) times daily.      oxycodone 5 MG capsule   Commonly known as: OXY-IR   Take 5 mg by mouth every 6 (six) hours as needed. Pain      prochlorperazine 10 MG tablet   Commonly known as: COMPAZINE   Take 10 mg by mouth every 6 (six) hours as needed. Nausea      sertraline 50 MG tablet   Commonly known as: ZOLOFT   Take 50-100 mg by mouth daily. Takes 50 mg one day and 100 mg the next day      sulfamethoxazole-trimethoprim 800-160 MG per tablet   Commonly known as: BACTRIM DS   Take 1 tablet by mouth 2 (two) times daily.      tacrolimus 1 MG capsule   Commonly known as: PROGRAF   Take 1.5 mg by mouth 2 (two) times daily. Takes with a 0.5 mg capsule      tacrolimus 0.5 MG capsule   Commonly known as: PROGRAF   Take 1.5 mg by mouth 2 (two) times daily. Takes with 1 mg capsule      ursodiol 300 MG capsule   Commonly known as: ACTIGALL   Take 600 mg by mouth 2 (two) times daily.      valGANciclovir 450 MG tablet   Commonly known as: VALCYTE   Take 450 mg by mouth 2 (two) times daily.          Results for orders placed in visit on 08/30/12 (from the past 48 hour(s))  CBC WITH DIFFERENTIAL     Status: Abnormal   Collection Time   08/30/12  2:33 PM      Component Value Range Comment   WBC 15.0 (*) 4.0 - 10.3 10e3/uL    NEUT# 10.8 (*) 1.5 - 6.5 10e3/uL    HGB 12.5 (*) 13.0 - 17.1 g/dL    HCT 62.9 (*) 52.8 - 49.9 %    Platelets 161  140 - 400 10e3/uL    MCV 90.4  79.3 - 98.0  fL    MCH 31.6  27.2 - 33.4 pg    MCHC 34.9  32.0 - 36.0 g/dL    RBC 4.13 (*) 2.44 - 5.82 10e6/uL    RDW 16.0 (*) 11.0 - 14.6 %    lymph# 2.3  0.9 - 3.3 10e3/uL    MONO# 1.9 (*) 0.1 - 0.9 10e3/uL    Eosinophils Absolute 0.0  0.0 - 0.5 10e3/uL    Basophils Absolute 0.1  0.0 - 0.1 10e3/uL    NEUT% 72.0  39.0 - 75.0 %    LYMPH% 15.0  14.0 - 49.0 %    MONO% 12.3  0.0 - 14.0 %    EOS% 0.2  0.0 - 7.0 %    BASO% 0.5  0.0 - 2.0 %   MAGNESIUM (CC13)     Status: Normal   Collection Time   08/30/12  2:33 PM      Component Value Range Comment   Magnesium 1.6  1.5 - 2.5 mg/dl   CK  Status: Normal   Collection Time   08/30/12  2:33 PM      Component Value Range Comment   Total CK 26  7 - 232 U/L     Results for INDIE, NICKERSON (MRN [id number]) as of 09/01/2012 08:33  Ref. Range 08/26/2012 05:40  Prothrombin Time Latest Range: 11.6-15.2 seconds 15.1  INR Latest Range: 0.00-1.49  1.17  aPTT Latest Range: 24-37 seconds 40 (H)    Review of Systems  Constitutional: Positive for malaise/fatigue. Negative for fever, chills and weight loss.  Respiratory: Negative for cough, hemoptysis, sputum production and shortness of breath.   Cardiovascular: Positive for leg swelling. Negative for chest pain, palpitations and orthopnea.  Gastrointestinal: Positive for nausea, vomiting and diarrhea. Negative for blood in stool.  Genitourinary: Negative.   Skin: Negative.   Neurological: Positive for dizziness and tremors. Negative for seizures and loss of consciousness.  Endo/Heme/Allergies: Bruises/bleeds easily.    Blood pressure 132/79, pulse 79, temperature 98.2 F (36.8 C), temperature source Oral, resp. rate 20, SpO2 94.00%. Physical Exam   Assessment/Plan Patient presents today for BM needle core biopsy. Procedure details and potential complications including but not limited to infection, bleeding, inadequate sampling and complications with moderate sedation reviewed with the patient's apparent  understanding. Written consent obtained.   Timothy [name] D 09/01/2012, 8:30 AM

## 2012-09-01 NOTE — Progress Notes (Signed)
Spoke with Audree Bane PA in trying to coordinate his appt time and IV infusion time so that pt would not be here all day.  Received new orders to infuse 1L NS with 1 gm Magnesium over 2 hours while here in short stay.

## 2012-09-01 NOTE — Progress Notes (Signed)
Pt back to short stay. Pt A & O x4.  He is drinking and eating crackers and tolerating well.  His wife is at his bedside.  Pt has no complaints.

## 2012-09-01 NOTE — Procedures (Signed)
Success RT ILIAC BM ASP AND CORE BX NO COMP Stable Full report in PACS

## 2012-09-01 NOTE — Telephone Encounter (Signed)
This RN called to [facility] to inquire about tapering of tacrolimus. Request will be given to transplant RN and tapering schedule will be faxed to this RN.  Informed AB/PA

## 2012-09-02 ENCOUNTER — Telehealth: Payer: Self-pay | Admitting: *Deleted

## 2012-09-02 ENCOUNTER — Ambulatory Visit (HOSPITAL_COMMUNITY)
Admission: RE | Admit: 2012-09-02 | Discharge: 2012-09-02 | Disposition: A | Discharge status: Home / Self Care | Payer: BC Managed Care – PPO | Source: Ambulatory Visit | Attending: Physician Assistant | Admitting: Physician Assistant

## 2012-09-02 DIAGNOSIS — Z9489 Other transplanted organ and tissue status: Secondary | ICD-10-CM

## 2012-09-02 DIAGNOSIS — R599 Enlarged lymph nodes, unspecified: Secondary | ICD-10-CM | POA: Insufficient documentation

## 2012-09-02 DIAGNOSIS — I517 Cardiomegaly: Secondary | ICD-10-CM | POA: Insufficient documentation

## 2012-09-02 DIAGNOSIS — M899 Disorder of bone, unspecified: Secondary | ICD-10-CM | POA: Insufficient documentation

## 2012-09-02 DIAGNOSIS — M949 Disorder of cartilage, unspecified: Secondary | ICD-10-CM | POA: Insufficient documentation

## 2012-09-02 DIAGNOSIS — R161 Splenomegaly, not elsewhere classified: Secondary | ICD-10-CM | POA: Insufficient documentation

## 2012-09-02 DIAGNOSIS — K4020 Bilateral inguinal hernia, without obstruction or gangrene, not specified as recurrent: Secondary | ICD-10-CM | POA: Insufficient documentation

## 2012-09-02 DIAGNOSIS — C9110 Chronic lymphocytic leukemia of B-cell type not having achieved remission: Secondary | ICD-10-CM | POA: Insufficient documentation

## 2012-09-02 DIAGNOSIS — I2510 Atherosclerotic heart disease of native coronary artery without angina pectoris: Secondary | ICD-10-CM | POA: Insufficient documentation

## 2012-09-02 DIAGNOSIS — I319 Disease of pericardium, unspecified: Secondary | ICD-10-CM | POA: Insufficient documentation

## 2012-09-02 DIAGNOSIS — D89811 Chronic graft-versus-host disease: Secondary | ICD-10-CM

## 2012-09-02 DIAGNOSIS — J90 Pleural effusion, not elsewhere classified: Secondary | ICD-10-CM | POA: Insufficient documentation

## 2012-09-02 MED ORDER — IOHEXOL 300 MG/ML  SOLN
125.0000 mL | Freq: Once | INTRAMUSCULAR | Status: AC | PRN
  Administered 2012-09-02: 125 mL via INTRAVENOUS

## 2012-09-02 NOTE — Telephone Encounter (Signed)
Phone number for [facility] for transplant concerns:  Phone [phone] Fax [phone]  Contact name is Kem Parkinson

## 2012-09-02 NOTE — Telephone Encounter (Signed)
This RN spoke with Timothy Parkinson RN with transplant team in [place]. Additional assessment information given per her reqquest.  Timothy [name] states pt's information will be given to the attending for review and taper schedule for tacrolimus and then faxed to [name] tomorrow afternoon.  Taper will start Monday 9/16.  This RN called the above to [facility] for her information as well as to request a copy of tapering schedule.

## 2012-09-03 ENCOUNTER — Ambulatory Visit (HOSPITAL_BASED_OUTPATIENT_CLINIC_OR_DEPARTMENT_OTHER): Payer: BC Managed Care – PPO

## 2012-09-03 ENCOUNTER — Other Ambulatory Visit (HOSPITAL_BASED_OUTPATIENT_CLINIC_OR_DEPARTMENT_OTHER): Payer: BC Managed Care – PPO

## 2012-09-03 VITALS — BP 155/73 | HR 73 | Temp 98.4°F | Resp 20

## 2012-09-03 DIAGNOSIS — Z9489 Other transplanted organ and tissue status: Secondary | ICD-10-CM

## 2012-09-03 DIAGNOSIS — D89811 Chronic graft-versus-host disease: Secondary | ICD-10-CM

## 2012-09-03 DIAGNOSIS — C9110 Chronic lymphocytic leukemia of B-cell type not having achieved remission: Secondary | ICD-10-CM

## 2012-09-03 DIAGNOSIS — E860 Dehydration: Secondary | ICD-10-CM

## 2012-09-03 LAB — CBC WITH DIFFERENTIAL/PLATELET
BASO%: 0.8 % (ref 0.0–2.0)
LYMPH%: 18 % (ref 14.0–49.0)
MCH: 31.2 pg (ref 27.2–33.4)
MCHC: 34.1 g/dL (ref 32.0–36.0)
MCV: 91.6 fL (ref 79.3–98.0)
MONO%: 18.2 % — ABNORMAL HIGH (ref 0.0–14.0)
Platelets: 113 10*3/uL — ABNORMAL LOW (ref 140–400)
RBC: 3.46 10*6/uL — ABNORMAL LOW (ref 4.20–5.82)
nRBC: 0 % (ref 0–0)

## 2012-09-03 MED ORDER — SODIUM CHLORIDE 0.9 % IV SOLN
Freq: Once | INTRAVENOUS | Status: AC
  Administered 2012-09-03: 15:00:00 via INTRAVENOUS
  Filled 2012-09-03: qty 1000

## 2012-09-04 ENCOUNTER — Ambulatory Visit (HOSPITAL_BASED_OUTPATIENT_CLINIC_OR_DEPARTMENT_OTHER): Payer: BC Managed Care – PPO

## 2012-09-04 VITALS — BP 162/89 | HR 68 | Temp 98.4°F | Resp 16

## 2012-09-04 DIAGNOSIS — C9110 Chronic lymphocytic leukemia of B-cell type not having achieved remission: Secondary | ICD-10-CM

## 2012-09-04 DIAGNOSIS — E860 Dehydration: Secondary | ICD-10-CM

## 2012-09-04 MED ORDER — SODIUM CHLORIDE 0.9 % IV SOLN
INTRAVENOUS | Status: AC
  Administered 2012-09-04: 09:00:00 via INTRAVENOUS

## 2012-09-04 NOTE — Patient Instructions (Signed)
Pt refused AVS.

## 2012-09-04 NOTE — Progress Notes (Signed)
Saw in Amy Berry's progress note that pt is receiving hydration M,W,F, and S. Released ivf from supportive therapy, I believe this was released from wrong place in chart. Realized this after finished charting.

## 2012-09-06 ENCOUNTER — Encounter: Payer: Self-pay | Admitting: Oncology

## 2012-09-06 ENCOUNTER — Ambulatory Visit (HOSPITAL_BASED_OUTPATIENT_CLINIC_OR_DEPARTMENT_OTHER): Payer: BC Managed Care – PPO

## 2012-09-06 ENCOUNTER — Other Ambulatory Visit (HOSPITAL_BASED_OUTPATIENT_CLINIC_OR_DEPARTMENT_OTHER): Payer: BC Managed Care – PPO

## 2012-09-06 VITALS — BP 151/84 | HR 84 | Temp 98.5°F | Resp 20

## 2012-09-06 DIAGNOSIS — C9110 Chronic lymphocytic leukemia of B-cell type not having achieved remission: Secondary | ICD-10-CM

## 2012-09-06 DIAGNOSIS — R110 Nausea: Secondary | ICD-10-CM

## 2012-09-06 DIAGNOSIS — Z9489 Other transplanted organ and tissue status: Secondary | ICD-10-CM

## 2012-09-06 DIAGNOSIS — D89811 Chronic graft-versus-host disease: Secondary | ICD-10-CM

## 2012-09-06 DIAGNOSIS — E860 Dehydration: Secondary | ICD-10-CM

## 2012-09-06 LAB — CBC WITH DIFFERENTIAL/PLATELET
Basophils Absolute: 0 10*3/uL (ref 0.0–0.1)
EOS%: 0.4 % (ref 0.0–7.0)
LYMPH%: 26 % (ref 14.0–49.0)
MCH: 32.4 pg (ref 27.2–33.4)
MCV: 95 fL (ref 79.3–98.0)
MONO%: 13.7 % (ref 0.0–14.0)
RBC: 3.47 10*6/uL — ABNORMAL LOW (ref 4.20–5.82)
RDW: 16 % — ABNORMAL HIGH (ref 11.0–14.6)

## 2012-09-06 LAB — MAGNESIUM (CC13): Magnesium: 1.7 mg/dl (ref 1.5–2.5)

## 2012-09-06 MED ORDER — SODIUM CHLORIDE 0.9 % IV SOLN
4.0000 mg | Freq: Once | INTRAVENOUS | Status: AC
  Administered 2012-09-06: 4 mg via INTRAVENOUS
  Filled 2012-09-06: qty 2

## 2012-09-06 MED ORDER — SODIUM CHLORIDE 0.9 % IV SOLN
INTRAVENOUS | Status: AC
  Administered 2012-09-06: 15:00:00 via INTRAVENOUS
  Filled 2012-09-06: qty 1000

## 2012-09-06 NOTE — Progress Notes (Signed)
[employer], [id number], approved ondansetron 8mg  30 tabs from 08/07/12-09/06/13.

## 2012-09-08 ENCOUNTER — Ambulatory Visit (HOSPITAL_BASED_OUTPATIENT_CLINIC_OR_DEPARTMENT_OTHER): Payer: BC Managed Care – PPO

## 2012-09-08 ENCOUNTER — Other Ambulatory Visit (HOSPITAL_BASED_OUTPATIENT_CLINIC_OR_DEPARTMENT_OTHER): Payer: BC Managed Care – PPO

## 2012-09-08 VITALS — BP 146/88 | HR 92 | Temp 98.2°F | Resp 20

## 2012-09-08 DIAGNOSIS — E860 Dehydration: Secondary | ICD-10-CM

## 2012-09-08 DIAGNOSIS — D89811 Chronic graft-versus-host disease: Secondary | ICD-10-CM

## 2012-09-08 DIAGNOSIS — C9421 Acute megakaryoblastic leukemia, in remission: Secondary | ICD-10-CM

## 2012-09-08 DIAGNOSIS — C9110 Chronic lymphocytic leukemia of B-cell type not having achieved remission: Secondary | ICD-10-CM

## 2012-09-08 DIAGNOSIS — B259 Cytomegaloviral disease, unspecified: Secondary | ICD-10-CM

## 2012-09-08 DIAGNOSIS — Z9489 Other transplanted organ and tissue status: Secondary | ICD-10-CM

## 2012-09-08 LAB — LACTATE DEHYDROGENASE (CC13): LDH: 256 U/L — ABNORMAL HIGH (ref 125–220)

## 2012-09-08 LAB — CBC WITH DIFFERENTIAL/PLATELET
Eosinophils Absolute: 0 10*3/uL (ref 0.0–0.5)
HCT: 34.2 % — ABNORMAL LOW (ref 38.4–49.9)
HGB: 11.4 g/dL — ABNORMAL LOW (ref 13.0–17.1)
LYMPH%: 21.9 % (ref 14.0–49.0)
MONO#: 0.5 10*3/uL (ref 0.1–0.9)
NEUT#: 2.7 10*3/uL (ref 1.5–6.5)
NEUT%: 65.2 % (ref 39.0–75.0)
Platelets: 162 10*3/uL (ref 140–400)
WBC: 4.2 10*3/uL (ref 4.0–10.3)
lymph#: 0.9 10*3/uL (ref 0.9–3.3)

## 2012-09-08 LAB — COMPREHENSIVE METABOLIC PANEL (CC13)
CO2: 23 mEq/L (ref 22–29)
Calcium: 8.7 mg/dL (ref 8.4–10.4)
Chloride: 104 mEq/L (ref 98–107)
Creatinine: 1.1 mg/dL (ref 0.7–1.3)
Glucose: 86 mg/dl (ref 70–99)
Total Bilirubin: 0.6 mg/dL (ref 0.20–1.20)
Total Protein: 5.2 g/dL — ABNORMAL LOW (ref 6.4–8.3)

## 2012-09-08 LAB — IGG, IGA, IGM: IgM, Serum: <5 mg/dL — ABNORMAL LOW (ref 41–251)

## 2012-09-08 LAB — MAGNESIUM (CC13): Magnesium: 1.6 mg/dl (ref 1.5–2.5)

## 2012-09-08 MED ORDER — SODIUM CHLORIDE 0.9 % IV SOLN: Freq: Once | INTRAVENOUS | Status: DC

## 2012-09-08 MED ORDER — SODIUM CHLORIDE 0.9 % IV SOLN
INTRAVENOUS | Status: AC
  Administered 2012-09-08: 15:00:00 via INTRAVENOUS

## 2012-09-08 MED ORDER — ACETAMINOPHEN 500 MG PO TABS
1000.0000 mg | ORAL_TABLET | Freq: Once | ORAL | Status: DC
  Administered 2012-09-08: 1000 mg via ORAL

## 2012-09-08 MED ORDER — SODIUM CHLORIDE 0.9 % IV SOLN: 2000.0000 mg | Freq: Once | INTRAVENOUS | Status: DC

## 2012-09-08 MED ORDER — METHYLPREDNISOLONE SODIUM SUCC 125 MG IJ SOLR
125.0000 mg | Freq: Once | INTRAMUSCULAR | Status: DC
  Administered 2012-09-08: 125 mg via INTRAVENOUS

## 2012-09-08 MED ORDER — SODIUM CHLORIDE 0.9 % IV SOLN
INTRAVENOUS | Status: AC
  Administered 2012-09-08: 15:00:00 via INTRAVENOUS
  Filled 2012-09-08: qty 1000

## 2012-09-08 MED ORDER — HEPARIN SOD (PORK) LOCK FLUSH 100 UNIT/ML IV SOLN
500.0000 [IU] | Freq: Once | INTRAVENOUS | Status: DC | PRN
  Filled 2012-09-08: qty 5

## 2012-09-08 MED ORDER — SODIUM CHLORIDE 0.9 % IJ SOLN
10.0000 mL | INTRAMUSCULAR | Status: DC | PRN
  Filled 2012-09-08: qty 10

## 2012-09-08 NOTE — Patient Instructions (Signed)
Patient aware of next appointment; discharged home with no complaints. 

## 2012-09-10 ENCOUNTER — Telehealth: Payer: Self-pay | Admitting: Medical Oncology

## 2012-09-10 ENCOUNTER — Telehealth: Payer: Self-pay | Admitting: *Deleted

## 2012-09-10 ENCOUNTER — Other Ambulatory Visit: Payer: Self-pay | Admitting: Medical Oncology

## 2012-09-10 ENCOUNTER — Other Ambulatory Visit: Payer: Self-pay | Admitting: Oncology

## 2012-09-10 ENCOUNTER — Other Ambulatory Visit (HOSPITAL_BASED_OUTPATIENT_CLINIC_OR_DEPARTMENT_OTHER): Payer: BC Managed Care – PPO

## 2012-09-10 ENCOUNTER — Ambulatory Visit (HOSPITAL_BASED_OUTPATIENT_CLINIC_OR_DEPARTMENT_OTHER): Payer: BC Managed Care – PPO | Admitting: Oncology

## 2012-09-10 ENCOUNTER — Ambulatory Visit (HOSPITAL_BASED_OUTPATIENT_CLINIC_OR_DEPARTMENT_OTHER): Payer: BC Managed Care – PPO

## 2012-09-10 VITALS — BP 117/74 | HR 84 | Temp 98.5°F | Resp 20 | Ht 68.0 in | Wt 200.3 lb

## 2012-09-10 DIAGNOSIS — Z9489 Other transplanted organ and tissue status: Secondary | ICD-10-CM

## 2012-09-10 DIAGNOSIS — C9110 Chronic lymphocytic leukemia of B-cell type not having achieved remission: Secondary | ICD-10-CM

## 2012-09-10 DIAGNOSIS — R894 Abnormal immunological findings in specimens from other organs, systems and tissues: Secondary | ICD-10-CM

## 2012-09-10 DIAGNOSIS — R768 Other specified abnormal immunological findings in serum: Secondary | ICD-10-CM

## 2012-09-10 DIAGNOSIS — D89811 Chronic graft-versus-host disease: Secondary | ICD-10-CM

## 2012-09-10 DIAGNOSIS — E2749 Other adrenocortical insufficiency: Secondary | ICD-10-CM

## 2012-09-10 DIAGNOSIS — C9421 Acute megakaryoblastic leukemia, in remission: Secondary | ICD-10-CM

## 2012-09-10 DIAGNOSIS — C9590 Leukemia, unspecified not having achieved remission: Secondary | ICD-10-CM

## 2012-09-10 LAB — COMPREHENSIVE METABOLIC PANEL (CC13)
ALT: 7 U/L (ref 0–55)
AST: 20 U/L (ref 5–34)
Alkaline Phosphatase: 95 U/L (ref 40–150)
Chloride: 108 mEq/L — ABNORMAL HIGH (ref 98–107)
Creatinine: 1 mg/dL (ref 0.7–1.3)
Total Bilirubin: 0.6 mg/dL (ref 0.20–1.20)

## 2012-09-10 LAB — CBC WITH DIFFERENTIAL/PLATELET
EOS%: 0.6 % (ref 0.0–7.0)
Eosinophils Absolute: 0 10*3/uL (ref 0.0–0.5)
MCV: 93.5 fL (ref 79.3–98.0)
MONO%: 15.2 % — ABNORMAL HIGH (ref 0.0–14.0)
NEUT#: 2.1 10*3/uL (ref 1.5–6.5)
RBC: 3.4 10*6/uL — ABNORMAL LOW (ref 4.20–5.82)
RDW: 15.9 % — ABNORMAL HIGH (ref 11.0–14.6)
nRBC: 0 % (ref 0–0)

## 2012-09-10 LAB — MAGNESIUM (CC13): Magnesium: 2 mg/dl (ref 1.5–2.5)

## 2012-09-10 MED ORDER — SODIUM CHLORIDE 0.9 % IV SOLN: Freq: Once | INTRAVENOUS | Status: DC

## 2012-09-10 MED ORDER — PREDNISONE 5 MG PO TABS: 5.0000 mg | ORAL_TABLET | Freq: Two times a day (BID) | ORAL | Status: DC

## 2012-09-10 MED ORDER — SODIUM CHLORIDE 0.9 % IV SOLN
Freq: Once | INTRAVENOUS | Status: AC
  Administered 2012-09-10: 11:00:00 via INTRAVENOUS
  Filled 2012-09-10: qty 1000

## 2012-09-10 NOTE — Patient Instructions (Signed)
Hypomagnesemia Magnesium is a common ion (mineral) in the body which is needed for metabolism. It is about how the body handles food and other chemical reactions necessary for life. Only about 2% of the magnesium in our body is found in the blood. When this is low, it is called hypomagnesemia. The blood will measure only a tiny amount of the magnesium in our body. When it is low in our blood, it does not mean that the whole body supply is low. The normal serum concentration ranges from 1.8-2.5 mEq/L. When the level gets to be less than 1.0 mEq/L, a number of problems begin to happen.  CAUSES   Receiving intravenous fluids without magnesium replacement.   Loss of magnesium from the bowel by naso-gastric suction.   Loss of magnesium from nausea and vomiting or severe diarrhea. Any of the inflammatory bowel conditions can cause this.   Abuse of alcohol often leads to low serum magnesium.   An inherited form of magnesium loss happens when the kidneys lose magnesium. This is called familial or primary hypomagnesemia.   Some medications such as diuretics also cause the loss of magnesium.  SYMPTOMS  These following problems are worse if the changes in magnesium levels come on suddenly.  Tremor.   Confusion.   Muscle weakness.   Over-sensitive to sights and sounds.   Sensitive reflexes.   Depression.   Muscular fibrillations.   Over-reactivity of the nerves.   Irritability.   Psychosis.   Spasms of the hand muscles.   Tetany (where the muscles go into uncontrollable spasms).  DIAGNOSIS  This condition can be diagnosed by blood tests. TREATMENT   In emergency, magnesium can be given intravenously (by vein).   If the condition is less worrisome, it can be corrected by diet. High levels of magnesium are found in green leafy vegetables, peas, beans and nuts among other things. It can also be given through medications by mouth.   If it is being caused by medications, changes can  be made.   If alcohol is a problem, help is available if there are difficulties giving it up.  Document Released: 09/03/2005 Document Revised: 11/27/2011 Document Reviewed: 07/28/2008 Exit[employer] Patient Information 2012 [employer]. 

## 2012-09-10 NOTE — Telephone Encounter (Signed)
fluids every mon/weds/fri fro next 3 weeks (2 hours); labs every weds x6; see berry or magrinat every weds xc6   Sent michelle email to set up patient's fluids  Please print out calendar and give to the patient in the treatment room

## 2012-09-10 NOTE — Telephone Encounter (Signed)
Long Term care coordinators from [facility] called about previous conversation with Vikki Ports, RN regarding CMV labs.    Spoke with Kem Parkinson regarding matter.  Gave results from last noted CMV levels, 9/6 and 8/28.  Per Darel Hong, patient to continue weekly CMV levels until 3 negative results.  After 3 negative results, patient can have level checked every other week.  Darel Hong requesting when patient stopped taking his gancyclovir, advised her that I am not aware of the date he stopped but medication was discontinued in system today, 9/20.  Darel Hong also requested that most recent CBC with differential be faxed for records, will also fax last two resulted CMV levels to [phone].

## 2012-09-10 NOTE — Telephone Encounter (Signed)
Per staff message and POF I have scheduled aappts. JMW  

## 2012-09-11 NOTE — Progress Notes (Signed)
ID: [name]   DOB: 08-07-1946  MR#: [id number]  WUJ#:[id number]  HISTORY OF PRESENT ILLNESS: We have very complete records from Dr. Sydnee Levans in [place], and in summary:  The patient was initially diagnosed in August 2000, with a white cell count of 23,600, but normal hemoglobin and platelets, and no significant symptomatology. Over the next several years his white cell count drifted up, and he eventually developed some symptoms of night sweats in particular, leading to treatment with fludarabine, Cytoxan and rituxan for five cycles given between December 2006 and May 2007.  We have CT scans from June 2006, November 2006 and April 2007, and comparing the November 2006 and April 2007 scans, there was near complete response. He had subsequent therapy in [place] as detailed below, but with decreased response, leading to allogeneic stem-cell transplant at the [facility] 02/24/2012.  INTERVAL HISTORY: Rickardo returns with his wife [name]  for followup, status post allogenic stem cell transplant for CLL in March 2013. Interval history is notable for his having received an injection of Solu-Medrol by mistake (this was an old order I which had not been canceled). The results of this mistake though was an immediate improvement in his functional status, Abely to exercise, appetite, and general sense of well-being. A second important interval fact is that he is now CMV negative by PCR, so we can finally stop his ganciclovir medication.  REVIEW OF SYSTEMS: He remains weak, but less so as noted. He has had no nausea or vomiting. Bowel movements are normal. He is not having any mouth sores or sore throat at present. He is walking around the house, where he has a "path" and he does that about 8 times a day. For the last 2 days he has also done some squats. He bruises easily. There have been no intercurrent fevers, drenching sweats, unusual headaches, worsening dizziness, worsening tremor,  rash, bleeding, cough, phlegm production, pleurisy, or change in bladder habits. A detailed review of systems was otherwise stable.  PAST MEDICAL HISTORY: Past Medical History  Diagnosis Date  . 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 Diverticular disease   . Hyperlipidemia   . Obesity   . Hypertension   . Hiatal hernia   . CMV (cytomegalovirus) antibody positive     pre-transplant, with seroconversion x2 pst-transplant  . Right bundle branch block     pre-transplant  . CKD (chronic kidney disease) stage 2, GFR 60-89 ml/min   . Pancytopenia   . Steroid-induced diabetes   . Atrial fibrillation     post-transplant  . Myopathy   . 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 Leukemia, chronic lymphoid   . 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 Tonsillectomy and adenoidectomy   . Bone marrow transplant     FAMILY HISTORY Family History  Problem Relation Age of Onset  . Cancer Father    The patient's father died from complications of chronic lymphocytic leukemia at the age of 84.  It had been diagnosed seven years before when he was 2.  The patient is enrolled in a familial chronic lymphocytic leukemia study out of the [facility].  The patient's mother is 53, alive, unfortunately suffering with dementia, and he has a brother, 28, who is otherwise in fair health.   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  His wife of >40 years, [name], is a homemaker.  Their daughter, [name], lives in [place].  She also is a Futures trader.  The patient has an 1 year old grandson and an 17-year-old granddaughter, and that is really the main reason he moved to this area.  He is a International aid/development worker.     ADVANCED DIRECTIVES:  HEALTH MAINTENANCE: History    Substance Use Topics  . Smoking status: Never Smoker   . Smokeless tobacco: Never Used  . Alcohol Use: No     Colonoscopy:  PAP:  Bone density:  Lipid panel:  Allergies  Allergen Reactions  . Benadryl (Diphenhydramine Hcl)     "Restless leg syndrome"    Current Outpatient Prescriptions  Medication Sig Dispense Refill  . Alum & Mag Hydroxide-Simeth (MAGIC MOUTHWASH W/LIDOCAINE) SOLN Take 5 mLs by mouth 4 (four) times daily as needed. Mouth sores      . antiseptic oral rinse (BIOTENE) LIQD 15 mLs by Mouth Rinse route 2 times daily at 12 noon and 4 pm.  237 mL  6  . Calcium Citrate (CITRACAL PO) Take 1,200 mg by mouth 2 (two) times daily.      . cholecalciferol (VITAMIN D) 1000 UNITS tablet Take 1,000 Units by mouth daily.      Marland Kitchen dexamethasone 0.5 MG/5ML elixir Take 1 mg by mouth daily. Rinse mouth 4-6 times daily and spit out      . dextrose 5 % SOLN 50 mL with magnesium sulfate 50 % SOLN 1 g Inject 1 g into the vein 3 (three) times a week. Gets this at [facility]      . filgrastim (NEUPOGEN) 480 MCG/1.6ML injection Inject 480 mcg into the skin 3 (three) times a week.       . labetalol (NORMODYNE) 200 MG tablet Take 200 mg by mouth 2 (two) times daily.      Marland Kitchen levofloxacin (LEVAQUIN) 750 MG tablet Take 750 mg by mouth daily.      Marland Kitchen lisinopril (PRINIVIL,ZESTRIL) 10 MG tablet Take 10 mg by mouth at bedtime.      Marland Kitchen LORazepam (ATIVAN) 0.5 MG tablet Take 0.5 mg by mouth every 6 (six) hours as needed. anxiety      . Multiple Vitamins-Minerals (CENTRUM SILVER ADULT 50+) TABS Take 1 tablet by mouth daily.      . Omega-3 Fatty Acids (FISH OIL) 300 MG CAPS Take 600 mg by mouth daily.      Marland Kitchen omeprazole (PRILOSEC) 20 MG capsule Take 20 mg by mouth daily.      . ondansetron (ZOFRAN-ODT) 8 MG disintegrating tablet Take 1 tablet (8 mg total) by mouth 2 (two) times daily.  30 tablet  1  . oxycodone (OXY-IR) 5 MG capsule Take 5 mg by mouth every 6 (six) hours as needed. Pain      . prochlorperazine  (COMPAZINE) 10 MG tablet Take 10 mg by mouth every 6 (six) hours as needed. Nausea      . sertraline (ZOLOFT) 50 MG tablet Take 50-100 mg by mouth daily. Takes 50 mg one day and 100 mg the next day      . sulfamethoxazole-trimethoprim (BACTRIM DS) 800-160 MG per tablet Take 1 tablet by mouth 2 (two) times daily.       . tacrolimus (PROGRAF) 0.5 MG capsule Take 1 mg by mouth every morning. Takes with 1 mg capsule      . tacrolimus (PROGRAF) 1 MG capsule Take  1.5 mg by mouth at bedtime. Takes with a 0.5 mg capsule      . ursodiol (ACTIGALL) 300 MG capsule Take 600 mg by mouth 2 (two) times daily.      . predniSONE (DELTASONE) 5 MG tablet Take 1 tablet (5 mg total) by mouth 2 (two) times daily. Take 2 tablets (10 mg) with breakfast, one tablet (5 mg) with supper  90 tablet  3   No current facility-administered medications for this visit.   Facility-Administered Medications Ordered in Other Visits  Medication Dose Route Frequency Provider Last Rate Last Dose  . sodium chloride 0.9 % injection 10 mL  10 mL Intravenous PRN Lowella Dell, MD   10 mL at 08/11/12 1606    OBJECTIVE: Middle-aged white male who appears tired  Filed Vitals:   09/10/12 0940  BP: 117/74  Pulse: 84  Temp: 98.5 F (36.9 C)  Resp: 20     Body mass index is 30.46 kg/(m^2).    Filed Weights   09/10/12 0940  Weight: 200 lb 4.8 oz (90.855 kg)   ECOG FS: 2  Sclerae unicteric  Oropharynx no lesions or thrush No cervical or supraclavicular adenopathy  Lungs clear to auscultation,  no rales or rhonchi  Heart regular rate and rhythm, no murmur appreciated Abd benign, specifically +BS, soft, nontender to palpation  MSK  no peripheral edema  Neuro: nonfocal, alert and oriented x 3, more positive affect noted Skin:  No evident rash    LAB RESULTS:  CMV negative (09/08/2012)  Lab Results  Component Value Date   WBC 3.5* 09/10/2012   NEUTROABS 2.1 09/10/2012   HGB 10.6* 09/10/2012   HCT 31.8* 09/10/2012   MCV  93.5 09/10/2012   PLT 139* 09/10/2012      Chemistry      Component Value Date/Time   NA 139 09/10/2012 0929   NA 136 08/26/2012 0540   K 3.8 09/10/2012 0929   K 3.7 08/26/2012 0540   CL 108* 09/10/2012 0929   CL 107 08/26/2012 0540   CO2 21* 09/10/2012 0929   CO2 21 08/26/2012 0540   BUN 13.0 09/10/2012 0929   BUN 8 08/26/2012 0540   CREATININE 1.0 09/10/2012 0929   CREATININE 1.03 08/26/2012 0540      Component Value Date/Time   CALCIUM 9.1 09/10/2012 0929   CALCIUM 8.2* 08/26/2012 0540   ALKPHOS 95 09/10/2012 0929   ALKPHOS 84 08/26/2012 0540   AST 20 09/10/2012 0929   AST 18 08/26/2012 0540   ALT 7 09/10/2012 0929   ALT 7 08/26/2012 0540   BILITOT 0.60 09/10/2012 0929   BILITOT 0.2* 08/26/2012 0540      Lipid Panel     Component Value Date/Time   CHOL 225* 08/20/2012 1341   TRIG 301* 08/20/2012 1341   HDL 23* 08/20/2012 1341   CHOLHDL 9.8 08/20/2012 1341   VLDL 60* 08/20/2012 1341   LDLCALC 142* 08/20/2012 1341   Results for MIKIO, VERHAEGHE (MRN [id number]) as of 09/11/2012 12:51  Ref. Range 07/21/2012 10:20 07/28/2012 15:22 08/04/2012 14:45 08/06/2012 15:10 08/11/2012 09:17  Tacrolimus Lvl Latest Range: 5.0-20.0 ng/mL 3.9 (L) 9.7 9.0 6.9 6.9   Magnesium was 1.6 on 08/30/2012.   STUDIES: Ct Soft Tissue Neck W Contrast  09/02/2012  *RADIOLOGY REPORT*  Clinical Data: Restaging of leukemia.  Status post ALIF genic transplant.  CT NECK WITH CONTRAST  Technique:  Multidetector CT imaging of the neck was performed with intravenous contrast.  Contrast: OMNIPAQUE IOHEXOL 300  MG/ML  SOLN  Comparison: None.  Findings: Limited imaging of the brain is unremarkable.  No focal mucosal or submucosal lesions are present.  The vocal cords are midline and symmetric.  The thyroid is unremarkable. The soft tissues are within normal limits.  A right IJ catheter is in place.  No significant cervical adenopathy is present.  A left pleural effusion is present.  The lung apices are otherwise clear.  Bone windows demonstrate  multilevel cervical spondylosis, most significant at C3-4, worse on the right.  IMPRESSION:  1.  No significant cervical adenopathy. 2.  Left pleural effusion. 3.  Moderate cervical spondylosis at C3-4.   Original Report Authenticated By: Jamesetta Orleans. MATTERN, M.D.    Ct Chest W Contrast  09/02/2012  *RADIOLOGY REPORT*  Clinical Data:  Restaging of chronic lymphocytic leukemia.  Status post bone marrow transplant 03/13.  Bone marrow biopsy 09/01/2012.  CT CHEST, ABDOMEN AND PELVIS WITH CONTRAST  Technique: Contiguous axial images of the chest abdomen and pelvis were obtained after IV contrast administration.  Contrast: 125 ml Omnipaque-300  Comparison: Plain film chest of 08/04/2012.  CT CHEST  Findings: Lung windows demonstrate patchy bibasilar atelectasis. Concurrent ground-glass opacity at the right lung base is favored also represent atelectasis.  Soft tissue windows demonstrate no supraclavicular adenopathy.  A right-sided Port-A-Cath which terminates at the high right atrium. No axillary adenopathy.  Mild cardiomegaly.  Small pericardial effusion. No central pulmonary embolism, on this non-dedicated study.  Coronary artery atherosclerosis.  Small left and trace right pleural effusion. No mediastinal or hilar adenopathy.  Fractures of the 9th through 11th posterior left ribs.  Callus deposition more medially at the ninth through twelfth ribs is also likely related to remote trauma.  Mild T12 compression deformity without canal compromise. Borderline loss of vertebral body height at T8-T9.  IMPRESSION:  1.  Left-sided rib fractures which are nonacute and favored to be post-traumatic. 2.  Mild T12 compression deformity without significant canal compromise. 3.  No adenopathy within the chest. 4.  Small left greater than right pleural effusions with small pericardial effusion. 5.  Bibasilar atelectasis; patchy ground-glass opacity the right lung base is also favored to represent atelectasis.  Atypical  infection is felt less likely.  CT ABDOMEN AND PELVIS  Findings:  Scattered too small to characterize liver lesions which are likely cysts.  Splenomegaly, with the spleen measuring 13.2 cm cranial caudal and 14.1x 9.8 cm transverse.  Normal stomach.  The gallbladder is borderline distended, without stone or specific evidence of acute cholecystitis.  No biliary ductal dilatation.  Normal adrenal glands and left kidney.  Too small to characterize right renal lesions.  They are increased number of small retroperitoneal nodes. Not pathologic by size criteria.  Under distended sigmoid and descending colon. Scattered colonic diverticula.  Normal terminal ileum. Normal small bowel without abdominal ascites.  Bilateral fat containing inguinal hernias.  A right external iliac node measures 1.0 cm on image 114. A left external iliac node measures 1.1 cm on image 107  Normal urinary bladder and prostate.  Trace cul-de-sac fluid; image 110.  A lucent lesion within the right iliac measures 1.9 cm on image 99.  Partial fusion of the left sacroiliac joint, likely degenerative.  IMPRESSION:  1.  Mild pelvic adenopathy.  Increased number of small abdominal retroperitoneal nodes. 2.  Small volume cul-de-sac fluid, nonspecific. 3.  Splenomegaly.  This is nonspecific in the setting of lymphoma/leukemia.  Splenic involvement cannot be excluded. 4.  Right iliac wing lucent  lesion.  When correlated with yesterday's biopsy, this is likely the site of sampling.   Original Report Authenticated By: Consuello Bossier, M.D.    Ct Abdomen Pelvis W Contrast  09/02/2012  *RADIOLOGY REPORT*  Clinical Data:  Restaging of chronic lymphocytic leukemia.  Status post bone marrow transplant 03/13.  Bone marrow biopsy 09/01/2012.  CT CHEST, ABDOMEN AND PELVIS WITH CONTRAST  Technique: Contiguous axial images of the chest abdomen and pelvis were obtained after IV contrast administration.  Contrast: 125 ml Omnipaque-300  Comparison: Plain film chest of  08/04/2012.  CT CHEST  Findings: Lung windows demonstrate patchy bibasilar atelectasis. Concurrent ground-glass opacity at the right lung base is favored also represent atelectasis.  Soft tissue windows demonstrate no supraclavicular adenopathy.  A right-sided Port-A-Cath which terminates at the high right atrium. No axillary adenopathy.  Mild cardiomegaly.  Small pericardial effusion. No central pulmonary embolism, on this non-dedicated study.  Coronary artery atherosclerosis.  Small left and trace right pleural effusion. No mediastinal or hilar adenopathy.  Fractures of the 9th through 11th posterior left ribs.  Callus deposition more medially at the ninth through twelfth ribs is also likely related to remote trauma.  Mild T12 compression deformity without canal compromise. Borderline loss of vertebral body height at T8-T9.  IMPRESSION:  1.  Left-sided rib fractures which are nonacute and favored to be post-traumatic. 2.  Mild T12 compression deformity without significant canal compromise. 3.  No adenopathy within the chest. 4.  Small left greater than right pleural effusions with small pericardial effusion. 5.  Bibasilar atelectasis; patchy ground-glass opacity the right lung base is also favored to represent atelectasis.  Atypical infection is felt less likely.  CT ABDOMEN AND PELVIS  Findings:  Scattered too small to characterize liver lesions which are likely cysts.  Splenomegaly, with the spleen measuring 13.2 cm cranial caudal and 14.1x 9.8 cm transverse.  Normal stomach.  The gallbladder is borderline distended, without stone or specific evidence of acute cholecystitis.  No biliary ductal dilatation.  Normal adrenal glands and left kidney.  Too small to characterize right renal lesions.  They are increased number of small retroperitoneal nodes. Not pathologic by size criteria.  Under distended sigmoid and descending colon. Scattered colonic diverticula.  Normal terminal ileum. Normal small bowel without  abdominal ascites.  Bilateral fat containing inguinal hernias.  A right external iliac node measures 1.0 cm on image 114. A left external iliac node measures 1.1 cm on image 107  Normal urinary bladder and prostate.  Trace cul-de-sac fluid; image 110.  A lucent lesion within the right iliac measures 1.9 cm on image 99.  Partial fusion of the left sacroiliac joint, likely degenerative.  IMPRESSION:  1.  Mild pelvic adenopathy.  Increased number of small abdominal retroperitoneal nodes. 2.  Small volume cul-de-sac fluid, nonspecific. 3.  Splenomegaly.  This is nonspecific in the setting of lymphoma/leukemia.  Splenic involvement cannot be excluded. 4.  Right iliac wing lucent lesion.  When correlated with yesterday's biopsy, this is likely the site of sampling.   Original Report Authenticated By: Consuello Bossier, M.D.    Ct Biopsy  09/01/2012  *RADIOLOGY REPORT*  Clinical Data:  CLL  CT GUIDED RIGHT ILIAC BONE MARROW ASPIRATION AND CORE BIOPSY  Date:  09/01/2012 09:00:00  Radiologist:  M. Ruel Favors, M.D.  Medications:  2 mg Versed, 100 mcg Fentanyl  Guidance:  CT  Sedation time:  15 minutes  Contrast volume:  None.  Complications:  No immediate  PROCEDURE/FINDINGS:  Informed consent was obtained from the patient following explanation of the procedure, risks, benefits and alternatives. The patient understands, agrees and consents for the procedure. All questions were addressed.  A time out was performed.  The patient was positioned prone and noncontrast localization CT was performed of the pelvis to demonstrate the iliac marrow spaces.  Maximal barrier sterile technique utilized including caps, mask, sterile gowns, sterile gloves, large sterile drape, hand hygiene, and betadine prep.  Under sterile conditions and local anesthesia, an 11 gauge coaxial bone biopsy needle was advanced into the right iliac marrow space. Needle position was confirmed with CT imaging. Initially, bone marrow aspiration was performed.  Next, the 11 gauge outer cannula was utilized to obtain a right iliac bone marrow core biopsy. Needle was removed. Hemostasis was obtained with compression. The patient tolerated the procedure well. Samples were prepared with the cytotechnologist. No immediate complications.  IMPRESSION: CT guided right iliac bone marrow aspiration and core biopsy.   Original Report Authenticated By: Judie Petit. Ruel Favors, M.D.    PATHOLOGY: Patient Name: STEN, DEMATTEO Accession #: [id number] DOB: [birth year] Age: 75 Gender: M Client Name [facility] Collected Date: 09/01/2012 Received Date: 09/01/2012 Physician:Michael Ruel Favors, MD Chart #: MRN # : [id number] Physician cc: Ruthann Cancer, MD Race:W Visit #: [id number].[id number] BONE MARROW REPORT FINAL DIAGNOSIS Diagnosis Bone Marrow, Aspirate,Biopsy, and Clot, right iliac - HYPERCELLULAR BONE MARROW FOR AGE WITH TRILINEAGE HEMATOPOIESIS. - SEE COMMENT. PERIPHERAL BLOOD: - NORMOCYTIC-NORMOCHROMIC ANEMIA. - LYMPHOPENIA - THROMBOCYTOPENIA. Diagnosis Note The bone marrow is generally hypercellular for age with trilineage hematopoiesis and relative abundance of granulocytic cells likely related to the history of growth factor therapy. This is associated with subtle dyspoietic changes essentially involving all cell lines to variable extent. No morphologic increase in blastic cells is identified. I suspect that the latter changes are related to previous therapy and or bone marrow transplant but correlation with cytogenetic studies is recommended. The lymphoid component is generally inconspicuous in the marrow and mostly consists of occasional very small lymphoid aggregates mostly composed of small lymphocytes. Flow cytometric analysis failed to show any B-cell component. Hence, there is no immunophenotypic evidence of a B-cell lymphoproliferative process in this material. Clinical correlation is recommended. (BNS:eps 09/02/12) Guerry Bruin MD Pathologist,  Electronic Signature (Case signed 09/02/2012)  Patient Name: ERIQUE, KASER Accession #: [id number] DOB: [birth year] Age: 72 Gender: M Client Name [facility] Collected Date: 09/01/2012 Received Date: 09/01/2012 Physician:Michael Ruel Favors, MD Chart #: MRN # : [id number] Physician cc: Race:W Visit #: [id number].[email or web address] FLOW CYTOMETRY REPORT INTERPRETATION Interpretation Bone Marrow Flow Cytometry - PREDOMINANCE OF T-CELLS WITH NONSPECIFIC REVERSAL OF CD4:CD8 RATIO - NO ABNORMAL B-CELL POPULATION IDENTIFIED Diagnosis Comment: B-cells are essentially absent and hence there is no evidence of a B-cell lymphoproliferative process Guerry Bruin MD Pathologist, Electronic Signature (Case signed 09/02/2012)   ASSESSMENT: 66 y.o. [place] man with a history of well-differentiated lymphocytic lymphoma/ chronic lymphoid leukemia initially diagnosed in 2000, not requiring intervention until 2006; with multiple chromosomal abnormalities.  His treatment history is as follows:  (1) fludarabine/cyclophosphamide/rituximab x5 completed May 2007.  (2) rituximab for 8 doses October 2010, with partial response  (3) Leustatin and ofatumumab weekly x8 July to September 2011 followed by maintenance ofatumumab maintenance ofatumumab every 2 months, with initial response but rising counts September 2012  (4) status-post unrelated donor stem-cell transplant 02/24/2012 at the [facility]  (a) conditioning regimen consisted of fludarabine + TBI at 200 cGy, followed by rituximab x27;  (b) CMV reactivation x2 (patient  CMV positive, donor negative), s/p ganciclovir treatment; 3d reactivation August 2013, s/p gancyclovir, with negative PCR mid-September 2013 (c) GVHD: involving gut and skin, treated with steroids, tacrolimus and MMF  (d) atrial fibrillation: resolved on amiodarone  (e) steroid-induced myopathy: improving  (f) hypomagnesemia  (g) hypogammaglobulinemia: s/p IVIG 07/09/2012  (h) elevated  triglycerides (606 on 07/14/2012)  (i) adrenal insufficiency (5) restaging studies September 2013 detailed above including CT scans, flow cytometry, and bone marrow biopsy, showed no evidence of residual chronic lymphoid leukemia.  PLAN: Christion's response to the unintended Solu-Medrol dose he received strongly suggests that he is having problems recovering because of adrenal insufficiency. We discussed steroid pros and cons in detail today, but I think a slower steroid taper would be useful to him, so we are starting him on prednisone 10 mg in the morning and 5 mg in the evening and see how he does over the next week or two. We will likely go to 7-1/2 and 2-1/2 mg after 2 weeks and then perhaps to 5 and 2-1/2 after another 2 weeks.   In the meantime, we are decreasing the tacrolimus as suggested by [facility], his current dose being 1 mg in the morning and 1 point 5 in the evening. On October 16 we will go to 1.0 mg twice a day, on November 15 a half a milligram twice a day, and on December 15 and have a milligram in the morning only. This assuming of course there is no exacerbation of his graft-versus-host disease.  I went over Kendrew's medications in detail today with him and [place]. We are of course discontinue the ganciclovir. We are continuing the 3 times a week fluids with magnesium and 3 times a week Neupogen, until his neutrophil count a reach a 7500. Other ongoing issues include  (1) chronic GVHD prophylaxis: On a lower dose of tacrolimus, and now low-dose prednisone as well  (2) CMV reactivation: resolved; we will continue to monitor her CMV weekly  (3) hypogammaglobulinemia: received IgG at 663 mg/kg 08/18/2012; follow  (4)   Hypomagnesemia:  Continuing with IV replacement 3 times weekly; we will try to go to twice a week and then once a week at beginning in October  (5) immunocompromise: continue prophylaxis with zovirax, diflucan, levofloxacin and Bactrim;  Receives Neupogen, 480 mcg 3  times a week,  continuing until the ANC is 7.5, as noted above  (6)  CGVHD assessment weekly initially, then at broadening intervals,    As always, they know to call with any changes or problems.   Marland KitchenMAGRINAT,Faye Strohman C    09/11/2012

## 2012-09-13 ENCOUNTER — Other Ambulatory Visit: Payer: Self-pay | Admitting: Physician Assistant

## 2012-09-13 ENCOUNTER — Other Ambulatory Visit: Payer: Self-pay | Admitting: Lab

## 2012-09-13 ENCOUNTER — Ambulatory Visit (HOSPITAL_BASED_OUTPATIENT_CLINIC_OR_DEPARTMENT_OTHER): Payer: BC Managed Care – PPO

## 2012-09-13 ENCOUNTER — Other Ambulatory Visit: Payer: Self-pay | Admitting: *Deleted

## 2012-09-13 ENCOUNTER — Telehealth: Payer: Self-pay | Admitting: *Deleted

## 2012-09-13 VITALS — BP 155/89 | HR 97 | Temp 97.5°F | Resp 20

## 2012-09-13 DIAGNOSIS — C9590 Leukemia, unspecified not having achieved remission: Secondary | ICD-10-CM

## 2012-09-13 DIAGNOSIS — C9421 Acute megakaryoblastic leukemia, in remission: Secondary | ICD-10-CM

## 2012-09-13 DIAGNOSIS — Z9489 Other transplanted organ and tissue status: Secondary | ICD-10-CM

## 2012-09-13 DIAGNOSIS — C9110 Chronic lymphocytic leukemia of B-cell type not having achieved remission: Secondary | ICD-10-CM

## 2012-09-13 DIAGNOSIS — D89811 Chronic graft-versus-host disease: Secondary | ICD-10-CM

## 2012-09-13 DIAGNOSIS — E860 Dehydration: Secondary | ICD-10-CM

## 2012-09-13 LAB — CBC WITH DIFFERENTIAL/PLATELET
BASO%: 0.9 % (ref 0.0–2.0)
EOS%: 0.8 % (ref 0.0–7.0)
HCT: 30 % — ABNORMAL LOW (ref 38.4–49.9)
MCH: 32.6 pg (ref 27.2–33.4)
MCHC: 33.9 g/dL (ref 32.0–36.0)
MONO#: 0.4 10*3/uL (ref 0.1–0.9)
NEUT%: 59.2 % (ref 39.0–75.0)
RBC: 3.12 10*6/uL — ABNORMAL LOW (ref 4.20–5.82)
RDW: 16.9 % — ABNORMAL HIGH (ref 11.0–14.6)
WBC: 2.7 10*3/uL — ABNORMAL LOW (ref 4.0–10.3)
lymph#: 0.7 10*3/uL — ABNORMAL LOW (ref 0.9–3.3)

## 2012-09-13 LAB — MAGNESIUM (CC13): Magnesium: 1.7 mg/dl (ref 1.5–2.5)

## 2012-09-13 MED ORDER — SODIUM CHLORIDE 0.9 % IJ SOLN
10.0000 mL | INTRAMUSCULAR | Status: DC | PRN
  Administered 2012-09-13: 10 mL via INTRAVENOUS
  Filled 2012-09-13: qty 10

## 2012-09-13 MED ORDER — SODIUM CHLORIDE 0.9 % IV SOLN
INTRAVENOUS | Status: AC
  Administered 2012-09-13: 16:00:00 via INTRAVENOUS
  Filled 2012-09-13: qty 1000

## 2012-09-13 MED ORDER — HEPARIN SOD (PORK) LOCK FLUSH 100 UNIT/ML IV SOLN
500.0000 [IU] | Freq: Once | INTRAVENOUS | Status: AC
  Administered 2012-09-13: 500 [IU] via INTRAVENOUS
  Filled 2012-09-13: qty 5

## 2012-09-13 MED ORDER — SODIUM CHLORIDE 0.9 % IV SOLN
INTRAVENOUS | Status: DC
  Administered 2012-09-13: 16:00:00 via INTRAVENOUS

## 2012-09-13 NOTE — Patient Instructions (Signed)
Patient aware of next appointment; discharged home with no complaints. 

## 2012-09-13 NOTE — Telephone Encounter (Signed)
This RN left message with [name] at the [facility] regarding low reading for CMV with last check on 09/08/2012 and parimeters regarding use of Gangcyclovir per their policy.  [name] will forward inquiry to attending and return call to this RN.  Note pt is scheduled for IV magnesium on 09/15/2012.

## 2012-09-13 NOTE — Telephone Encounter (Signed)
Please add labs with all IV infusions (Mon, Wed, and Fri) through 10/01/12  Patient aware of all appointments new times same dates

## 2012-09-15 ENCOUNTER — Other Ambulatory Visit (HOSPITAL_BASED_OUTPATIENT_CLINIC_OR_DEPARTMENT_OTHER): Payer: BC Managed Care – PPO

## 2012-09-15 ENCOUNTER — Other Ambulatory Visit: Payer: Self-pay | Admitting: *Deleted

## 2012-09-15 ENCOUNTER — Ambulatory Visit (HOSPITAL_BASED_OUTPATIENT_CLINIC_OR_DEPARTMENT_OTHER): Payer: BC Managed Care – PPO

## 2012-09-15 ENCOUNTER — Ambulatory Visit (HOSPITAL_BASED_OUTPATIENT_CLINIC_OR_DEPARTMENT_OTHER): Payer: BC Managed Care – PPO | Admitting: Physician Assistant

## 2012-09-15 ENCOUNTER — Encounter: Payer: Self-pay | Admitting: Physician Assistant

## 2012-09-15 VITALS — BP 136/78 | HR 81 | Temp 98.3°F | Resp 20 | Ht 68.0 in | Wt 198.5 lb

## 2012-09-15 DIAGNOSIS — D89811 Chronic graft-versus-host disease: Secondary | ICD-10-CM

## 2012-09-15 DIAGNOSIS — C9110 Chronic lymphocytic leukemia of B-cell type not having achieved remission: Secondary | ICD-10-CM

## 2012-09-15 DIAGNOSIS — B259 Cytomegaloviral disease, unspecified: Secondary | ICD-10-CM

## 2012-09-15 DIAGNOSIS — R768 Other specified abnormal immunological findings in serum: Secondary | ICD-10-CM

## 2012-09-15 DIAGNOSIS — Z9489 Other transplanted organ and tissue status: Secondary | ICD-10-CM

## 2012-09-15 DIAGNOSIS — D849 Immunodeficiency, unspecified: Secondary | ICD-10-CM

## 2012-09-15 DIAGNOSIS — D801 Nonfamilial hypogammaglobulinemia: Secondary | ICD-10-CM

## 2012-09-15 DIAGNOSIS — E860 Dehydration: Secondary | ICD-10-CM

## 2012-09-15 DIAGNOSIS — R894 Abnormal immunological findings in specimens from other organs, systems and tissues: Secondary | ICD-10-CM

## 2012-09-15 DIAGNOSIS — C9590 Leukemia, unspecified not having achieved remission: Secondary | ICD-10-CM

## 2012-09-15 LAB — LACTATE DEHYDROGENASE (CC13): LDH: 179 U/L (ref 125–220)

## 2012-09-15 LAB — COMPREHENSIVE METABOLIC PANEL (CC13)
ALT: 10 U/L (ref 0–55)
AST: 19 U/L (ref 5–34)
CO2: 23 mEq/L (ref 22–29)
Calcium: 8.9 mg/dL (ref 8.4–10.4)
Chloride: 106 mEq/L (ref 98–107)
Creatinine: 1.1 mg/dL (ref 0.7–1.3)
Sodium: 137 mEq/L (ref 136–145)
Total Protein: 5.1 g/dL — ABNORMAL LOW (ref 6.4–8.3)

## 2012-09-15 LAB — CBC WITH DIFFERENTIAL/PLATELET
Basophils Absolute: 0 10*3/uL (ref 0.0–0.1)
EOS%: 0.3 % (ref 0.0–7.0)
HCT: 30.5 % — ABNORMAL LOW (ref 38.4–49.9)
HGB: 10.4 g/dL — ABNORMAL LOW (ref 13.0–17.1)
LYMPH%: 24.1 % (ref 14.0–49.0)
MCH: 33.3 pg (ref 27.2–33.4)
MCV: 97 fL (ref 79.3–98.0)
MONO%: 10.5 % (ref 0.0–14.0)
NEUT%: 64.2 % (ref 39.0–75.0)
Platelets: 165 10*3/uL (ref 140–400)

## 2012-09-15 LAB — MAGNESIUM (CC13): Magnesium: 2 mg/dl (ref 1.5–2.5)

## 2012-09-15 MED ORDER — SODIUM CHLORIDE 0.9 % IV SOLN
INTRAVENOUS | Status: AC
  Administered 2012-09-15: 17:00:00 via INTRAVENOUS
  Filled 2012-09-15: qty 1000

## 2012-09-15 MED ORDER — FILGRASTIM 480 MCG/0.8ML IJ SOLN
480.0000 ug | Freq: Once | INTRAMUSCULAR | Status: AC
  Administered 2012-09-15: 480 ug via SUBCUTANEOUS
  Filled 2012-09-15: qty 0.8

## 2012-09-15 MED ORDER — SODIUM CHLORIDE FLUSH 0.9 % IV SOLN: INTRAVENOUS | Status: DC

## 2012-09-15 MED ORDER — HEPARIN SOD (PORK) LOCK FLUSH 100 UNIT/ML IV SOLN: INTRAVENOUS | Status: DC

## 2012-09-15 MED ORDER — OXYCODONE HCL 5 MG PO CAPS: 5.0000 mg | ORAL_CAPSULE | Freq: Four times a day (QID) | ORAL | Status: DC | PRN

## 2012-09-15 NOTE — Progress Notes (Signed)
ID: [name]   DOB: 1946/06/04  MR#: [id number]  WUJ#:[id number]  HISTORY OF PRESENT ILLNESS: We have very complete records from Dr. Sydnee Levans in [place], and in summary:  The patient was initially diagnosed in August 2000, with a white cell count of 23,600, but normal hemoglobin and platelets, and no significant symptomatology. Over the next several years his white cell count drifted up, and he eventually developed some symptoms of night sweats in particular, leading to treatment with fludarabine, Cytoxan and rituxan for five cycles given between December 2006 and May 2007.  We have CT scans from June 2006, November 2006 and April 2007, and comparing the November 2006 and April 2007 scans, there was near complete response. He had subsequent therapy in [place] as detailed below, but with decreased response, leading to allogeneic stem-cell transplant at the [facility] 02/24/2012.  INTERVAL HISTORY: Rohith returns with his wife [name]  for followup, status post allogenic stem cell transplant for CLL in March 2013.   Since his last appointment here, he started on prednisone as prescribed by Dr. Darnelle Catalan, currently taking 10 mg in the morning and 5 in the afternoon. This does seem to help his energy, but also makes him feel a little jittery and causes some restless legs. His appetite has improved. He's had no additional problems with nausea or emesis since discontinuing the ganciclovir. He continues, however, to have some problems with constipation, and has not had a bowel movement for a few days. He is passing gas, and denies any significant abdominal pain.   Interval history is also remarkable for receiving the results of Nishawn's CMV test drawn on 09/08/12 which was in fact positive once again. We have reviewed this with the clinic in [place], and he will need to reinitiate ganciclovir accordingly.  REVIEW OF SYSTEMS: He remains weak, but less so as noted. He is exercising  regularly, walking on the days he does not come to the cancer Center. He's had no fevers, drenching night sweats, or chills.  He is not having any mouth sores or sore throat at present. He bruises easily, but denies any abnormal bleeding.  He denies unusual headaches, worsening dizziness, worsening tremor, cough, phlegm production, pleurisy, or change in bladder habits.  He has some chronic back pain for which he utilizes oxycodone appropriately and affectively, and he requests a refill on that medication today.  A detailed review of systems was otherwise stable.  PAST MEDICAL HISTORY: Past Medical History  Diagnosis Date  . 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 Diverticular disease   . Hyperlipidemia   . Obesity   . Hypertension   . Hiatal hernia   . CMV (cytomegalovirus) antibody positive     pre-transplant, with seroconversion x2 pst-transplant  . Right bundle branch block     pre-transplant  . CKD (chronic kidney disease) stage 2, GFR 60-89 ml/min   . Pancytopenia   . Steroid-induced diabetes   . Atrial fibrillation     post-transplant  . Myopathy   . 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 Leukemia, chronic lymphoid   . 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 Tonsillectomy and adenoidectomy   . Bone marrow transplant     FAMILY HISTORY Family History  Problem Relation Age of Onset  . Cancer Father    The patient's father died from complications of chronic lymphocytic leukemia at  the age of 34.  It had been diagnosed seven years before when he was 70.  The patient is enrolled in a familial chronic lymphocytic leukemia study out of the [facility].  The patient's mother is 20, alive, unfortunately suffering with dementia, and he has a brother, 74, who is otherwise in fair health.   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  His wife of >40 years, [name], is a homemaker.  Their daughter, [name], lives in [place].  She also is a Futures trader.  The patient has an 10 year old grandson and an 48-year-old granddaughter, and that is really the main reason he moved to this area.  He is a International aid/development worker.     ADVANCED DIRECTIVES:  HEALTH MAINTENANCE: History  Substance Use Topics  . Smoking status: Never Smoker   . Smokeless tobacco: Never Used  . Alcohol Use: No     Colonoscopy:  PAP:  Bone density:  Lipid panel:  Allergies  Allergen Reactions  . Benadryl (Diphenhydramine Hcl)     "Restless leg syndrome"    Current Outpatient Prescriptions  Medication Sig Dispense Refill  . chlorhexidine (PERIDEX) 0.12 % solution       . VALCYTE 450 MG tablet       . Alum & Mag Hydroxide-Simeth (MAGIC MOUTHWASH W/LIDOCAINE) SOLN Take 5 mLs by mouth 4 (four) times daily as needed. Mouth sores      . antiseptic oral rinse (BIOTENE) LIQD 15 mLs by Mouth Rinse route 2 times daily at 12 noon and 4 pm.  237 mL  6  . Calcium Citrate (CITRACAL PO) Take 1,200 mg by mouth 2 (two) times daily.      . cholecalciferol (VITAMIN D) 1000 UNITS tablet Take 1,000 Units by mouth daily.      Marland Kitchen dexamethasone 0.5 MG/5ML elixir Take 1 mg by mouth daily. Rinse mouth 4-6 times daily and spit out      . dextrose 5 % SOLN 50 mL with magnesium sulfate 50 % SOLN 1 g Inject 1 g into the vein 3 (three) times a week. Gets [facility] at [facility]      . filgrastim (NEUPOGEN) 480 MCG/1.6ML injection Inject 480 mcg into the skin 3 (three) times a week.       . labetalol (NORMODYNE) 200 MG tablet Take 200 mg by mouth 2 (two) times daily.      Marland Kitchen levofloxacin (LEVAQUIN) 750 MG tablet Take 750 mg by mouth daily.      Marland Kitchen lisinopril (PRINIVIL,ZESTRIL) 10 MG tablet Take 10 mg by mouth at bedtime.      Marland Kitchen LORazepam (ATIVAN) 0.5 MG tablet Take 0.5 mg by mouth every 6 (six)  hours as needed. anxiety      . Multiple Vitamins-Minerals (CENTRUM SILVER ADULT 50+) TABS Take 1 tablet by mouth daily.      . Omega-3 Fatty Acids (FISH OIL) 300 MG CAPS Take 600 mg by mouth daily.      Marland Kitchen omeprazole (PRILOSEC) 20 MG capsule Take 20 mg by mouth daily.      . ondansetron (ZOFRAN-ODT) 8 MG disintegrating tablet Take 1 tablet (8 mg total) by mouth 2 (two) times daily.  30 tablet  1  . oxycodone (OXY-IR) 5 MG capsule Take 1 capsule (5 mg total) by mouth every 6 (six) hours as needed. Pain  30 capsule  0  . predniSONE (DELTASONE) 5  MG tablet Take 1 tablet (5 mg total) by mouth 2 (two) times daily. Take 2 tablets (10 mg) with breakfast, one tablet (5 mg) with supper  90 tablet  3  . prochlorperazine (COMPAZINE) 10 MG tablet Take 10 mg by mouth every 6 (six) hours as needed. Nausea      . sertraline (ZOLOFT) 50 MG tablet Take 50-100 mg by mouth daily. Takes 50 mg one day and 100 mg the next day      . sulfamethoxazole-trimethoprim (BACTRIM DS) 800-160 MG per tablet Take 1 tablet by mouth 2 (two) times daily.       . tacrolimus (PROGRAF) 0.5 MG capsule Take 1 mg by mouth every morning. Takes with 1 mg capsule      . tacrolimus (PROGRAF) 1 MG capsule Take 1.5 mg by mouth at bedtime. Takes with a 0.5 mg capsule      . ursodiol (ACTIGALL) 300 MG capsule Take 600 mg by mouth 2 (two) times daily.       No current facility-administered medications for this visit.   Facility-Administered Medications Ordered in Other Visits  Medication Dose Route Frequency Provider Last Rate Last Dose  . sodium chloride 0.9 % 1,000 mL with magnesium sulfate 1 g infusion   Intravenous Continuous Katheren Jimmerson G Sair Faulcon, PA      . sodium chloride 0.9 % injection 10 mL  10 mL Intravenous PRN Lowella Dell, MD   10 mL at 08/11/12 1606    OBJECTIVE: Middle-aged white male who appears tired  Filed Vitals:   09/15/12 1545  BP: 136/78  Pulse: 81  Temp: 98.3 F (36.8 C)  Resp: 20     Body mass index is 30.18 kg/(m^2).     Filed Weights   09/15/12 1545  Weight: 198 lb 8 oz (90.039 kg)   ECOG FS: 2  Sclerae unicteric  Oropharynx no lesions or thrush No cervical or supraclavicular adenopathy  Lungs clear to auscultation,  no rales or rhonchi  Heart regular rate and rhythm, no murmur appreciated Abd benign, specifically +BS, soft, nontender to palpation  MSK  1+ pitting edema bilaterally in the lower extremities, equal bilaterally. No upper extremity edema noted Neuro: nonfocal, alert and oriented x 3,  Skin:  No evident rash    LAB RESULTS:  CMV negative (09/08/2012)  Lab Results  Component Value Date   WBC 2.8* 09/15/2012   NEUTROABS 1.8 09/15/2012   HGB 10.4* 09/15/2012   HCT 30.5* 09/15/2012   MCV 97.0 09/15/2012   PLT 165 09/15/2012      Chemistry      Component Value Date/Time   NA 137 09/15/2012 1514   NA 136 08/26/2012 0540   K 4.0 09/15/2012 1514   K 3.7 08/26/2012 0540   CL 106 09/15/2012 1514   CL 107 08/26/2012 0540   CO2 23 09/15/2012 1514   CO2 21 08/26/2012 0540   BUN 13.0 09/15/2012 1514   BUN 8 08/26/2012 0540   CREATININE 1.1 09/15/2012 1514   CREATININE 1.03 08/26/2012 0540      Component Value Date/Time   CALCIUM 8.9 09/15/2012 1514   CALCIUM 8.2* 08/26/2012 0540   ALKPHOS 80 09/15/2012 1514   ALKPHOS 84 08/26/2012 0540   AST 19 09/15/2012 1514   AST 18 08/26/2012 0540   ALT 10 09/15/2012 1514   ALT 7 08/26/2012 0540   BILITOT 0.40 09/15/2012 1514   BILITOT 0.2* 08/26/2012 0540      Lipid Panel     Component  Value Date/Time   CHOL 225* 08/20/2012 1341   TRIG 301* 08/20/2012 1341   HDL 23* 08/20/2012 1341   CHOLHDL 9.8 08/20/2012 1341   VLDL 60* 08/20/2012 1341   LDLCALC 142* 08/20/2012 1341   Results for MACDONALD, RIGOR (MRN [id number]) as of 09/11/2012 12:51  Ref. Range 07/21/2012 10:20 07/28/2012 15:22 08/04/2012 14:45 08/06/2012 15:10 08/11/2012 09:17  Tacrolimus Lvl Latest Range: 5.0-20.0 ng/mL 3.9 (L) 9.7 9.0 6.9 6.9   Magnesium was 1.7 on 09/13/2012.   STUDIES: Ct Soft Tissue Neck  W Contrast  09/02/2012  *RADIOLOGY REPORT*  Clinical Data: Restaging of leukemia.  Status post ALIF genic transplant.  CT NECK WITH CONTRAST  Technique:  Multidetector CT imaging of the neck was performed with intravenous contrast.  Contrast: OMNIPAQUE IOHEXOL 300 MG/ML  SOLN  Comparison: None.  Findings: Limited imaging of the brain is unremarkable.  No focal mucosal or submucosal lesions are present.  The vocal cords are midline and symmetric.  The thyroid is unremarkable. The soft tissues are within normal limits.  A right IJ catheter is in place.  No significant cervical adenopathy is present.  A left pleural effusion is present.  The lung apices are otherwise clear.  Bone windows demonstrate multilevel cervical spondylosis, most significant at C3-4, worse on the right.  IMPRESSION:  1.  No significant cervical adenopathy. 2.  Left pleural effusion. 3.  Moderate cervical spondylosis at C3-4.   Original Report Authenticated By: Jamesetta Orleans. MATTERN, M.D.    Ct Chest W Contrast  09/02/2012  *RADIOLOGY REPORT*  Clinical Data:  Restaging of chronic lymphocytic leukemia.  Status post bone marrow transplant 03/13.  Bone marrow biopsy 09/01/2012.  CT CHEST, ABDOMEN AND PELVIS WITH CONTRAST  Technique: Contiguous axial images of the chest abdomen and pelvis were obtained after IV contrast administration.  Contrast: 125 ml Omnipaque-300  Comparison: Plain film chest of 08/04/2012.  CT CHEST  Findings: Lung windows demonstrate patchy bibasilar atelectasis. Concurrent ground-glass opacity at the right lung base is favored also represent atelectasis.  Soft tissue windows demonstrate no supraclavicular adenopathy.  A right-sided Port-A-Cath which terminates at the high right atrium. No axillary adenopathy.  Mild cardiomegaly.  Small pericardial effusion. No central pulmonary embolism, on this non-dedicated study.  Coronary artery atherosclerosis.  Small left and trace right pleural effusion. No mediastinal or  hilar adenopathy.  Fractures of the 9th through 11th posterior left ribs.  Callus deposition more medially at the ninth through twelfth ribs is also likely related to remote trauma.  Mild T12 compression deformity without canal compromise. Borderline loss of vertebral body height at T8-T9.  IMPRESSION:  1.  Left-sided rib fractures which are nonacute and favored to be post-traumatic. 2.  Mild T12 compression deformity without significant canal compromise. 3.  No adenopathy within the chest. 4.  Small left greater than right pleural effusions with small pericardial effusion. 5.  Bibasilar atelectasis; patchy ground-glass opacity the right lung base is also favored to represent atelectasis.  Atypical infection is felt less likely.  CT ABDOMEN AND PELVIS  Findings:  Scattered too small to characterize liver lesions which are likely cysts.  Splenomegaly, with the spleen measuring 13.2 cm cranial caudal and 14.1x 9.8 cm transverse.  Normal stomach.  The gallbladder is borderline distended, without stone or specific evidence of acute cholecystitis.  No biliary ductal dilatation.  Normal adrenal glands and left kidney.  Too small to characterize right renal lesions.  They are increased number of small retroperitoneal nodes. Not pathologic by  size criteria.  Under distended sigmoid and descending colon. Scattered colonic diverticula.  Normal terminal ileum. Normal small bowel without abdominal ascites.  Bilateral fat containing inguinal hernias.  A right external iliac node measures 1.0 cm on image 114. A left external iliac node measures 1.1 cm on image 107  Normal urinary bladder and prostate.  Trace cul-de-sac fluid; image 110.  A lucent lesion within the right iliac measures 1.9 cm on image 99.  Partial fusion of the left sacroiliac joint, likely degenerative.  IMPRESSION:  1.  Mild pelvic adenopathy.  Increased number of small abdominal retroperitoneal nodes. 2.  Small volume cul-de-sac fluid, nonspecific. 3.   Splenomegaly.  This is nonspecific in the setting of lymphoma/leukemia.  Splenic involvement cannot be excluded. 4.  Right iliac wing lucent lesion.  When correlated with yesterday's biopsy, this is likely the site of sampling.   Original Report Authenticated By: Consuello Bossier, M.D.    Ct Abdomen Pelvis W Contrast  09/02/2012  *RADIOLOGY REPORT*  Clinical Data:  Restaging of chronic lymphocytic leukemia.  Status post bone marrow transplant 03/13.  Bone marrow biopsy 09/01/2012.  CT CHEST, ABDOMEN AND PELVIS WITH CONTRAST  Technique: Contiguous axial images of the chest abdomen and pelvis were obtained after IV contrast administration.  Contrast: 125 ml Omnipaque-300  Comparison: Plain film chest of 08/04/2012.  CT CHEST  Findings: Lung windows demonstrate patchy bibasilar atelectasis. Concurrent ground-glass opacity at the right lung base is favored also represent atelectasis.  Soft tissue windows demonstrate no supraclavicular adenopathy.  A right-sided Port-A-Cath which terminates at the high right atrium. No axillary adenopathy.  Mild cardiomegaly.  Small pericardial effusion. No central pulmonary embolism, on this non-dedicated study.  Coronary artery atherosclerosis.  Small left and trace right pleural effusion. No mediastinal or hilar adenopathy.  Fractures of the 9th through 11th posterior left ribs.  Callus deposition more medially at the ninth through twelfth ribs is also likely related to remote trauma.  Mild T12 compression deformity without canal compromise. Borderline loss of vertebral body height at T8-T9.  IMPRESSION:  1.  Left-sided rib fractures which are nonacute and favored to be post-traumatic. 2.  Mild T12 compression deformity without significant canal compromise. 3.  No adenopathy within the chest. 4.  Small left greater than right pleural effusions with small pericardial effusion. 5.  Bibasilar atelectasis; patchy ground-glass opacity the right lung base is also favored to represent  atelectasis.  Atypical infection is felt less likely.  CT ABDOMEN AND PELVIS  Findings:  Scattered too small to characterize liver lesions which are likely cysts.  Splenomegaly, with the spleen measuring 13.2 cm cranial caudal and 14.1x 9.8 cm transverse.  Normal stomach.  The gallbladder is borderline distended, without stone or specific evidence of acute cholecystitis.  No biliary ductal dilatation.  Normal adrenal glands and left kidney.  Too small to characterize right renal lesions.  They are increased number of small retroperitoneal nodes. Not pathologic by size criteria.  Under distended sigmoid and descending colon. Scattered colonic diverticula.  Normal terminal ileum. Normal small bowel without abdominal ascites.  Bilateral fat containing inguinal hernias.  A right external iliac node measures 1.0 cm on image 114. A left external iliac node measures 1.1 cm on image 107  Normal urinary bladder and prostate.  Trace cul-de-sac fluid; image 110.  A lucent lesion within the right iliac measures 1.9 cm on image 99.  Partial fusion of the left sacroiliac joint, likely degenerative.  IMPRESSION:  1.  Mild pelvic  adenopathy.  Increased number of small abdominal retroperitoneal nodes. 2.  Small volume cul-de-sac fluid, nonspecific. 3.  Splenomegaly.  This is nonspecific in the setting of lymphoma/leukemia.  Splenic involvement cannot be excluded. 4.  Right iliac wing lucent lesion.  When correlated with yesterday's biopsy, this is likely the site of sampling.   Original Report Authenticated By: Consuello Bossier, M.D.    Ct Biopsy  09/01/2012  *RADIOLOGY REPORT*  Clinical Data:  CLL  CT GUIDED RIGHT ILIAC BONE MARROW ASPIRATION AND CORE BIOPSY  Date:  09/01/2012 09:00:00  Radiologist:  M. Ruel Favors, M.D.  Medications:  2 mg Versed, 100 mcg Fentanyl  Guidance:  CT  Sedation time:  15 minutes  Contrast volume:  None.  Complications:  No immediate  PROCEDURE/FINDINGS:  Informed consent was obtained from the patient  following explanation of the procedure, risks, benefits and alternatives. The patient understands, agrees and consents for the procedure. All questions were addressed.  A time out was performed.  The patient was positioned prone and noncontrast localization CT was performed of the pelvis to demonstrate the iliac marrow spaces.  Maximal barrier sterile technique utilized including caps, mask, sterile gowns, sterile gloves, large sterile drape, hand hygiene, and betadine prep.  Under sterile conditions and local anesthesia, an 11 gauge coaxial bone biopsy needle was advanced into the right iliac marrow space. Needle position was confirmed with CT imaging. Initially, bone marrow aspiration was performed. Next, the 11 gauge outer cannula was utilized to obtain a right iliac bone marrow core biopsy. Needle was removed. Hemostasis was obtained with compression. The patient tolerated the procedure well. Samples were prepared with the cytotechnologist. No immediate complications.  IMPRESSION: CT guided right iliac bone marrow aspiration and core biopsy.   Original Report Authenticated By: Judie Petit. Ruel Favors, M.D.    PATHOLOGY: Patient Name: KAZDEN, MULVENNA Accession #: [id number] DOB: [birth year] Age: 43 Gender: M Client Name [facility] Collected Date: 09/01/2012 Received Date: 09/01/2012 Physician:Michael Ruel Favors, MD Chart #: MRN # : [id number] Physician cc: Ruthann Cancer, MD Race:W Visit #: [id number].[id number] BONE MARROW REPORT FINAL DIAGNOSIS Diagnosis Bone Marrow, Aspirate,Biopsy, and Clot, right iliac - HYPERCELLULAR BONE MARROW FOR AGE WITH TRILINEAGE HEMATOPOIESIS. - SEE COMMENT. PERIPHERAL BLOOD: - NORMOCYTIC-NORMOCHROMIC ANEMIA. - LYMPHOPENIA - THROMBOCYTOPENIA. Diagnosis Note The bone marrow is generally hypercellular for age with trilineage hematopoiesis and relative abundance of granulocytic cells likely related to the history of growth factor therapy. This is associated with subtle  dyspoietic changes essentially involving all cell lines to variable extent. No morphologic increase in blastic cells is identified. I suspect that the latter changes are related to previous therapy and or bone marrow transplant but correlation with cytogenetic studies is recommended. The lymphoid component is generally inconspicuous in the marrow and mostly consists of occasional very small lymphoid aggregates mostly composed of small lymphocytes. Flow cytometric analysis failed to show any B-cell component. Hence, there is no immunophenotypic evidence of a B-cell lymphoproliferative process in this material. Clinical correlation is recommended. (BNS:eps 09/02/12) Guerry Bruin MD Pathologist, Electronic Signature (Case signed 09/02/2012)  Patient Name: BABLU, PHILIP Accession #: [id number] DOB: 09-06-1946 Age: 54 Gender: M Client Name [facility] Collected Date: 09/01/2012 Received Date: 09/01/2012 Physician:Michael Ruel Favors, MD Chart #: MRN # : [id number] Physician cc: Race:W Visit #: [id number].[email or web address] FLOW CYTOMETRY REPORT INTERPRETATION Interpretation Bone Marrow Flow Cytometry - PREDOMINANCE OF T-CELLS WITH NONSPECIFIC REVERSAL OF CD4:CD8 RATIO - NO ABNORMAL B-CELL POPULATION IDENTIFIED Diagnosis Comment: B-cells are essentially absent and hence  there is no evidence of a B-cell lymphoproliferative process Guerry Bruin MD Pathologist, Electronic Signature (Case signed 09/02/2012)   ASSESSMENT: 66 y.o. [place] man with a history of well-differentiated lymphocytic lymphoma/ chronic lymphoid leukemia initially diagnosed in 2000, not requiring intervention until 2006; with multiple chromosomal abnormalities.  His treatment history is as follows:  (1) fludarabine/cyclophosphamide/rituximab x5 completed May 2007.  (2) rituximab for 8 doses October 2010, with partial response  (3) Leustatin and ofatumumab weekly x8 July to September 2011 followed by maintenance ofatumumab  maintenance ofatumumab every 2 months, with initial response but rising counts September 2012  (4) status-post unrelated donor stem-cell transplant 02/24/2012 at [facility]  (a) conditioning regimen consisted of fludarabine + TBI at 200 cGy, followed by rituximab x27;  (b) CMV reactivation x2 (patient CMV positive, donor negative), s/p ganciclovir treatment; 3d reactivation August 2013, s/p gancyclovir, with negative PCR mid-September 2013 (c) GVHD: involving gut and skin, treated with steroids, tacrolimus and MMF  (d) atrial fibrillation: resolved on amiodarone  (e) steroid-induced myopathy: improving  (f) hypomagnesemia  (g) hypogammaglobulinemia: s/p IVIG 07/09/2012  (h) elevated triglycerides (606 on 07/14/2012)  (i) adrenal insufficiency (5) restaging studies September 2013 detailed above including CT scans, flow cytometry, and bone marrow biopsy, showed no evidence of residual chronic lymphoid leukemia.  PLAN: The only changes we are making to Kamonte's plan today is the reinitiation of ganciclovir which he will take twice dailyFor 7 days, then decreased to once daily for at least another 14 days. Of course in the meanwhile, we will continue to check his CMV PCR every Wednesday.   Astyn will continue on the prednisone, 10 mg in the morning and 5 mg in the evening for the next week. At that point, we will likely begin a very slow taper.  We will likely go to 7-1/2 and 2-1/2 mg after 2 weeks and then perhaps to 5 and 2-1/2 after another 2 weeks. He will try to use some lorazepam as needed for the jittery feelings and the restless legs.   In the meantime, we are decreasing the tacrolimus as suggested by [facility], his current dose being 1 mg in the morning and 1 point 5 in the evening. On October 16 we will go to 1.0 mg twice a day, on November 15 a half a milligram twice a day, and on December 15 and have a milligram in the morning only. This assuming of course there is no exacerbation of his  graft-versus-host disease.  I went over Tres's medications in detail today with him and [place]. We are of course discontinue the ganciclovir. We are continuing the 3 times a week fluids with magnesium and 3 times a week Neupogen, until his neutrophil count a reach a 7500. Other ongoing issues include  (1) chronic GVHD prophylaxis: On a lower dose of tacrolimus, and now low-dose prednisone as well  (2) CMV reactivation: resumed ganciclovir as noted above ; we will continue to monitor her CMV weekly  (3) hypogammaglobulinemia: received IgG at 663 mg/kg 08/18/2012; follow with weekly labs   (4)   Hypomagnesemia:  Continuing with IV replacement 3 times weekly; we will try to go to twice a week and then once a week at beginning in October  (5) immunocompromise: continue prophylaxis with zovirax, diflucan, levofloxacin and Bactrim;  Receives Neupogen, 480 mcg 3 times a week,  continuing until the ANC is 7.5, as noted above  (6)  CGVHD assessment weekly initially, then at broadening intervals,    As always, they  know to call with any changes or problems.   Zollie Scale    09/15/2012

## 2012-09-15 NOTE — Telephone Encounter (Signed)
No new orders and per AB nothing changing. Moved 10/1 lb to 9/30 per relative and gv her new schedule for September thru November.

## 2012-09-15 NOTE — Patient Instructions (Addendum)
[facility] Discharge Instructions for Patients Receiving Chemotherapy  Today you received the following IV fluids with 1 gram Magnesium Sulfate and Neupogen.  To help prevent nausea and vomiting after your treatment, we encourage you to take your nausea medication as prescribed.   If you develop nausea and vomiting that is not controlled by your nausea medication, call the clinic. If it is after clinic hours your family physician or the after hours number for the clinic or go to the Emergency Department.   BELOW ARE SYMPTOMS THAT SHOULD BE REPORTED IMMEDIATELY:  *FEVER GREATER THAN 100.5 F  *CHILLS WITH OR WITHOUT FEVER  NAUSEA AND VOMITING THAT IS NOT CONTROLLED WITH YOUR NAUSEA MEDICATION  *UNUSUAL SHORTNESS OF BREATH  *UNUSUAL BRUISING OR BLEEDING  TENDERNESS IN MOUTH AND THROAT WITH OR WITHOUT PRESENCE OF ULCERS  *URINARY PROBLEMS  *BOWEL PROBLEMS  UNUSUAL RASH Items with * indicate a potential emergency and should be followed up as soon as possible.  One of the nurses will contact you 24 hours after your treatment. Please let the nurse know about any problems that you may have experienced. Feel free to call the clinic you have any questions or concerns. The clinic phone number is [phone].   I have been informed and understand all the instructions given to me. I know to contact the clinic, my physician, or go to the Emergency Department if any problems should occur. I do not have any questions at this time, but understand that I may call the clinic during office hours   should I have any questions or need assistance in obtaining follow up care.    __________________________________________  _____________  __________ Signature of Patient or Authorized Representative            Date                   Time    __________________________________________ Nurse's Signature

## 2012-09-16 ENCOUNTER — Other Ambulatory Visit: Payer: Self-pay | Admitting: Oncology

## 2012-09-17 ENCOUNTER — Other Ambulatory Visit (HOSPITAL_BASED_OUTPATIENT_CLINIC_OR_DEPARTMENT_OTHER): Payer: BC Managed Care – PPO

## 2012-09-17 ENCOUNTER — Ambulatory Visit (HOSPITAL_BASED_OUTPATIENT_CLINIC_OR_DEPARTMENT_OTHER): Payer: BC Managed Care – PPO

## 2012-09-17 VITALS — BP 136/28 | HR 82 | Temp 97.3°F

## 2012-09-17 DIAGNOSIS — C9110 Chronic lymphocytic leukemia of B-cell type not having achieved remission: Secondary | ICD-10-CM

## 2012-09-17 DIAGNOSIS — C9590 Leukemia, unspecified not having achieved remission: Secondary | ICD-10-CM

## 2012-09-17 DIAGNOSIS — D89811 Chronic graft-versus-host disease: Secondary | ICD-10-CM

## 2012-09-17 DIAGNOSIS — Z9489 Other transplanted organ and tissue status: Secondary | ICD-10-CM

## 2012-09-17 DIAGNOSIS — T865 Complications of stem cell transplant: Secondary | ICD-10-CM

## 2012-09-17 DIAGNOSIS — D709 Neutropenia, unspecified: Secondary | ICD-10-CM

## 2012-09-17 DIAGNOSIS — E860 Dehydration: Secondary | ICD-10-CM

## 2012-09-17 DIAGNOSIS — D849 Immunodeficiency, unspecified: Secondary | ICD-10-CM

## 2012-09-17 DIAGNOSIS — B259 Cytomegaloviral disease, unspecified: Secondary | ICD-10-CM

## 2012-09-17 LAB — CBC WITH DIFFERENTIAL/PLATELET
Basophils Absolute: 0.1 10*3/uL (ref 0.0–0.1)
EOS%: 0.4 % (ref 0.0–7.0)
Eosinophils Absolute: 0 10*3/uL (ref 0.0–0.5)
HCT: 33.2 % — ABNORMAL LOW (ref 38.4–49.9)
HGB: 11 g/dL — ABNORMAL LOW (ref 13.0–17.1)
MONO#: 0.5 10*3/uL (ref 0.1–0.9)
NEUT#: 6.2 10*3/uL (ref 1.5–6.5)
NEUT%: 80.2 % — ABNORMAL HIGH (ref 39.0–75.0)
RDW: 17.3 % — ABNORMAL HIGH (ref 11.0–14.6)
WBC: 7.7 10*3/uL (ref 4.0–10.3)
lymph#: 0.9 10*3/uL (ref 0.9–3.3)

## 2012-09-17 MED ORDER — SODIUM CHLORIDE 0.9 % IV SOLN
INTRAVENOUS | Status: AC
  Administered 2012-09-17: 15:00:00 via INTRAVENOUS
  Filled 2012-09-17: qty 1000

## 2012-09-17 MED ORDER — FILGRASTIM 480 MCG/0.8ML IJ SOLN
480.0000 ug | Freq: Once | INTRAMUSCULAR | Status: AC
  Administered 2012-09-17: 480 ug via SUBCUTANEOUS
  Filled 2012-09-17: qty 0.8

## 2012-09-17 NOTE — Progress Notes (Signed)
Dressing change on hickman cath rt chest, using sterile tech, with biopatch, and new caps.  Site without redness, drainage or warmth.  dmr

## 2012-09-17 NOTE — Patient Instructions (Addendum)
[facility] Discharge Instructions for Patients Receiving Chemotherapy  Today you received the following chemotherapy agents IV fluids with 1 gram Magnesium and Neupogen.  To help prevent nausea and vomiting after your treatment, we encourage you to take your nausea medication as prescribed.   If you develop nausea and vomiting that is not controlled by your nausea medication, call the clinic. If it is after clinic hours your family physician or the after hours number for the clinic or go to the Emergency Department.   BELOW ARE SYMPTOMS THAT SHOULD BE REPORTED IMMEDIATELY:  *FEVER GREATER THAN 100.5 F  *CHILLS WITH OR WITHOUT FEVER  NAUSEA AND VOMITING THAT IS NOT CONTROLLED WITH YOUR NAUSEA MEDICATION  *UNUSUAL SHORTNESS OF BREATH  *UNUSUAL BRUISING OR BLEEDING  TENDERNESS IN MOUTH AND THROAT WITH OR WITHOUT PRESENCE OF ULCERS  *URINARY PROBLEMS  *BOWEL PROBLEMS  UNUSUAL RASH Items with * indicate a potential emergency and should be followed up as soon as possible.  One of the nurses will contact you 24 hours after your treatment. Please let the nurse know about any problems that you may have experienced. Feel free to call the clinic you have any questions or concerns. The clinic phone number is [phone].   I have been informed and understand all the instructions given to me. I know to contact the clinic, my physician, or go to the Emergency Department if any problems should occur. I do not have any questions at this time, but understand that I may call the clinic during office hours   should I have any questions or need assistance in obtaining follow up care.    __________________________________________  _____________  __________ Signature of Patient or Authorized Representative            Date                   Time    __________________________________________ Nurse's Signature

## 2012-09-20 ENCOUNTER — Ambulatory Visit (HOSPITAL_BASED_OUTPATIENT_CLINIC_OR_DEPARTMENT_OTHER): Payer: BC Managed Care – PPO

## 2012-09-20 ENCOUNTER — Other Ambulatory Visit: Payer: Self-pay | Admitting: Oncology

## 2012-09-20 VITALS — BP 135/80 | HR 76 | Temp 98.6°F | Resp 20

## 2012-09-20 DIAGNOSIS — C8599 Non-Hodgkin lymphoma, unspecified, extranodal and solid organ sites: Secondary | ICD-10-CM

## 2012-09-20 DIAGNOSIS — C9110 Chronic lymphocytic leukemia of B-cell type not having achieved remission: Secondary | ICD-10-CM

## 2012-09-20 DIAGNOSIS — E291 Testicular hypofunction: Secondary | ICD-10-CM

## 2012-09-20 DIAGNOSIS — D89811 Chronic graft-versus-host disease: Secondary | ICD-10-CM

## 2012-09-20 DIAGNOSIS — Z9489 Other transplanted organ and tissue status: Secondary | ICD-10-CM

## 2012-09-20 DIAGNOSIS — C9590 Leukemia, unspecified not having achieved remission: Secondary | ICD-10-CM

## 2012-09-20 DIAGNOSIS — D849 Immunodeficiency, unspecified: Secondary | ICD-10-CM

## 2012-09-20 DIAGNOSIS — E860 Dehydration: Secondary | ICD-10-CM

## 2012-09-20 DIAGNOSIS — D709 Neutropenia, unspecified: Secondary | ICD-10-CM

## 2012-09-20 LAB — CBC WITH DIFFERENTIAL/PLATELET
Basophils Absolute: 0 10*3/uL (ref 0.0–0.1)
Eosinophils Absolute: 0 10*3/uL (ref 0.0–0.5)
HCT: 32.8 % — ABNORMAL LOW (ref 38.4–49.9)
HGB: 10.9 g/dL — ABNORMAL LOW (ref 13.0–17.1)
MCV: 95.3 fL (ref 79.3–98.0)
MONO%: 13.2 % (ref 0.0–14.0)
NEUT#: 4.1 10*3/uL (ref 1.5–6.5)
NEUT%: 67 % (ref 39.0–75.0)
Platelets: 134 10*3/uL — ABNORMAL LOW (ref 140–400)
RDW: 16.8 % — ABNORMAL HIGH (ref 11.0–14.6)

## 2012-09-20 LAB — MAGNESIUM (CC13): Magnesium: 1.9 mg/dl (ref 1.5–2.5)

## 2012-09-20 MED ORDER — HEPARIN SOD (PORK) LOCK FLUSH 100 UNIT/ML IV SOLN
250.0000 [IU] | Freq: Once | INTRAVENOUS | Status: AC
  Administered 2012-09-20: 250 [IU] via INTRAVENOUS
  Filled 2012-09-20: qty 5

## 2012-09-20 MED ORDER — SODIUM CHLORIDE 0.9 % IV SOLN
Freq: Once | INTRAVENOUS | Status: AC
  Administered 2012-09-20: 16:00:00 via INTRAVENOUS
  Filled 2012-09-20: qty 1000

## 2012-09-20 MED ORDER — SODIUM CHLORIDE 0.9 % IJ SOLN
10.0000 mL | Freq: Once | INTRAMUSCULAR | Status: AC
  Administered 2012-09-20: 10 mL
  Filled 2012-09-20: qty 10

## 2012-09-20 MED ORDER — SODIUM CHLORIDE 0.9 % IJ SOLN
10.0000 mL | INTRAMUSCULAR | Status: DC | PRN
  Administered 2012-09-20: 10 mL via INTRAVENOUS
  Filled 2012-09-20: qty 10

## 2012-09-20 MED ORDER — HEPARIN SOD (PORK) LOCK FLUSH 100 UNIT/ML IV SOLN
500.0000 [IU] | Freq: Once | INTRAVENOUS | Status: AC
  Administered 2012-09-20: 500 [IU] via INTRAVENOUS
  Filled 2012-09-20: qty 5

## 2012-09-20 MED ORDER — FILGRASTIM 480 MCG/0.8ML IJ SOLN
480.0000 ug | Freq: Once | INTRAMUSCULAR | Status: AC
  Administered 2012-09-20: 480 ug via SUBCUTANEOUS
  Filled 2012-09-20: qty 0.8

## 2012-09-20 MED ORDER — SODIUM CHLORIDE 0.9 % IV SOLN
Freq: Once | INTRAVENOUS | Status: AC
  Administered 2012-09-20: 16:00:00 via INTRAVENOUS

## 2012-09-20 NOTE — Patient Instructions (Addendum)
[facility] Discharge Instructions for Patients Receiving Chemotherapy  Today you received the following chemotherapy agents :  IVF with magnesium,  Neupogen.  To help prevent nausea and vomiting after your treatment, we encourage you to take your nausea medication  As instructed by your physician, and take meds as needed for nausea.    If you develop nausea and vomiting that is not controlled by your nausea medication, call the clinic. If it is after clinic hours your family physician or the after hours number for the clinic or go to the Emergency Department.   BELOW ARE SYMPTOMS THAT SHOULD BE REPORTED IMMEDIATELY:  *FEVER GREATER THAN 100.5 F  *CHILLS WITH OR WITHOUT FEVER  NAUSEA AND VOMITING THAT IS NOT CONTROLLED WITH YOUR NAUSEA MEDICATION  *UNUSUAL SHORTNESS OF BREATH  *UNUSUAL BRUISING OR BLEEDING  TENDERNESS IN MOUTH AND THROAT WITH OR WITHOUT PRESENCE OF ULCERS  *URINARY PROBLEMS  *BOWEL PROBLEMS  UNUSUAL RASH Items with * indicate a potential emergency and should be followed up as soon as possible.  One of the nurses will contact you 24 hours after your treatment. Please let the nurse know about any problems that you may have experienced. Feel free to call the clinic you have any questions or concerns. The clinic phone number is [phone].   I have been informed and understand all the instructions given to me. I know to contact the clinic, my physician, or go to the Emergency Department if any problems should occur. I do not have any questions at this time, but understand that I may call the clinic during office hours   should I have any questions or need assistance in obtaining follow up care.    __________________________________________  _____________  __________ Signature of Patient or Authorized Representative            Date                   Time    __________________________________________ Nurse's Signature

## 2012-09-21 ENCOUNTER — Other Ambulatory Visit: Payer: Self-pay | Admitting: Lab

## 2012-09-21 ENCOUNTER — Other Ambulatory Visit: Payer: Self-pay | Admitting: Oncology

## 2012-09-22 ENCOUNTER — Ambulatory Visit (HOSPITAL_BASED_OUTPATIENT_CLINIC_OR_DEPARTMENT_OTHER): Payer: BC Managed Care – PPO

## 2012-09-22 ENCOUNTER — Telehealth: Payer: Self-pay | Admitting: *Deleted

## 2012-09-22 ENCOUNTER — Other Ambulatory Visit (HOSPITAL_BASED_OUTPATIENT_CLINIC_OR_DEPARTMENT_OTHER): Payer: BC Managed Care – PPO | Admitting: Lab

## 2012-09-22 ENCOUNTER — Encounter: Payer: Self-pay | Admitting: Physician Assistant

## 2012-09-22 ENCOUNTER — Ambulatory Visit (HOSPITAL_BASED_OUTPATIENT_CLINIC_OR_DEPARTMENT_OTHER): Payer: BC Managed Care – PPO | Admitting: Physician Assistant

## 2012-09-22 VITALS — BP 132/70 | HR 80 | Temp 98.4°F | Resp 20 | Ht 68.0 in | Wt 194.9 lb

## 2012-09-22 DIAGNOSIS — Z9489 Other transplanted organ and tissue status: Secondary | ICD-10-CM

## 2012-09-22 DIAGNOSIS — D849 Immunodeficiency, unspecified: Secondary | ICD-10-CM

## 2012-09-22 DIAGNOSIS — D89811 Chronic graft-versus-host disease: Secondary | ICD-10-CM

## 2012-09-22 DIAGNOSIS — C9110 Chronic lymphocytic leukemia of B-cell type not having achieved remission: Secondary | ICD-10-CM

## 2012-09-22 DIAGNOSIS — E860 Dehydration: Secondary | ICD-10-CM

## 2012-09-22 DIAGNOSIS — B259 Cytomegaloviral disease, unspecified: Secondary | ICD-10-CM

## 2012-09-22 DIAGNOSIS — C9590 Leukemia, unspecified not having achieved remission: Secondary | ICD-10-CM

## 2012-09-22 DIAGNOSIS — D709 Neutropenia, unspecified: Secondary | ICD-10-CM

## 2012-09-22 DIAGNOSIS — D801 Nonfamilial hypogammaglobulinemia: Secondary | ICD-10-CM

## 2012-09-22 LAB — CBC WITH DIFFERENTIAL/PLATELET
BASO%: 0.5 % (ref 0.0–2.0)
Eosinophils Absolute: 0.1 10*3/uL (ref 0.0–0.5)
LYMPH%: 20.6 % (ref 14.0–49.0)
MCHC: 33 g/dL (ref 32.0–36.0)
MONO#: 1 10*3/uL — ABNORMAL HIGH (ref 0.1–0.9)
NEUT#: 4 10*3/uL (ref 1.5–6.5)
Platelets: 105 10*3/uL — ABNORMAL LOW (ref 140–400)
RBC: 3.51 10*6/uL — ABNORMAL LOW (ref 4.20–5.82)
RDW: 16.6 % — ABNORMAL HIGH (ref 11.0–14.6)
WBC: 6.5 10*3/uL (ref 4.0–10.3)
nRBC: 0 % (ref 0–0)

## 2012-09-22 LAB — COMPREHENSIVE METABOLIC PANEL (CC13)
ALT: 8 U/L (ref 0–55)
Alkaline Phosphatase: 73 U/L (ref 40–150)
Glucose: 107 mg/dl — ABNORMAL HIGH (ref 70–99)
Sodium: 137 mEq/L (ref 136–145)
Total Bilirubin: 0.3 mg/dL (ref 0.20–1.20)
Total Protein: 4.8 g/dL — ABNORMAL LOW (ref 6.4–8.3)

## 2012-09-22 LAB — MAGNESIUM (CC13): Magnesium: 2 mg/dl (ref 1.5–2.5)

## 2012-09-22 MED ORDER — FILGRASTIM 480 MCG/0.8ML IJ SOLN
480.0000 ug | Freq: Once | INTRAMUSCULAR | Status: AC
  Administered 2012-09-22: 480 ug via SUBCUTANEOUS
  Filled 2012-09-22: qty 0.8

## 2012-09-22 MED ORDER — SULFAMETHOXAZOLE-TMP DS 800-160 MG PO TABS: 1.0000 | ORAL_TABLET | Freq: Every day | ORAL | Status: DC

## 2012-09-22 MED ORDER — ONDANSETRON 8 MG PO TBDP: 8.0000 mg | ORAL_TABLET | Freq: Two times a day (BID) | ORAL | Status: DC

## 2012-09-22 MED ORDER — SODIUM CHLORIDE 0.9 % IV SOLN
INTRAVENOUS | Status: AC
  Administered 2012-09-22: 13:00:00 via INTRAVENOUS
  Filled 2012-09-22: qty 1000

## 2012-09-22 NOTE — Telephone Encounter (Signed)
Per staff message and POF I have scheduled apapts. JMW  

## 2012-09-22 NOTE — Telephone Encounter (Signed)
ADD 2 Hr infusion after AB on 10/16; lab and 2 hr infusion on 10/14 and 10/18  Sent michelle email to set up patient treatment

## 2012-09-22 NOTE — Progress Notes (Signed)
ID: Timothy [name]   DOB: 09-12-1946  MR#: [id number]  WUJ#:[id number]  HISTORY OF PRESENT ILLNESS: We have very complete records from Dr. Sydnee Levans in [place], and in summary:  The patient was initially diagnosed in August 2000, with a white cell count of 23,600, but normal hemoglobin and platelets, and no significant symptomatology. Over the next several years his white cell count drifted up, and he eventually developed some symptoms of night sweats in particular, leading to treatment with fludarabine, Cytoxan and rituxan for five cycles given between December 2006 and May 2007.  We have CT scans from June 2006, November 2006 and April 2007, and comparing the November 2006 and April 2007 scans, there was near complete response. He had subsequent therapy in [place] as detailed below, but with decreased response, leading to allogeneic stem-cell transplant at the [facility] 02/24/2012.  INTERVAL HISTORY: Timothy [name] returns with his wife Timothy [name]  for followup, status post allogenic stem cell transplant for CLL in March 2013.   Interval history is remarkable for Timothy [name] having started back on his ganciclovir twice a day for the past 7 days. Fortunately, his  CMV DNA was negative one week ago, so he will be able to decrease that dose. It does make him nauseous, and he's had 2 episodes of emesis this past week. He finds ondansetron to be helpful. His appetite is fair.  Te and also continues on prednisone, currently taking 10 mg in the morning and 5 in the afternoon.  this has helped with his energy level, and overall he is feeling better. He is walking on a regular basis now with his family.   REVIEW OF SYSTEMS: He remains weak, but less so as noted above. He's had no fevers, drenching night sweats, or chills.  He is not having any mouth sores or sore throat at present. He bruises easily, but denies any abnormal bleeding.  He denies unusual headaches, worsening dizziness, worsening tremor,  cough, phlegm production, pleurisy, or  shortness of breath.   He has some chronic back pain for which he utilizes oxycodone appropriately and effectively. He tends to be slightly constipated, but manages to have very small bowel movements at least every other day. He may need to be a little more aggressive with stool softeners and/or laxatives.  A detailed review of systems was otherwise stable.  PAST MEDICAL HISTORY: Past Medical History  Diagnosis Date  . 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 Diverticular disease   . Hyperlipidemia   . Obesity   . Hypertension   . Hiatal hernia   . CMV (cytomegalovirus) antibody positive     pre-transplant, with seroconversion x2 pst-transplant  . Right bundle branch block     pre-transplant  . CKD (chronic kidney disease) stage 2, GFR 60-89 ml/min   . Pancytopenia   . Steroid-induced diabetes   . Atrial fibrillation     post-transplant  . Myopathy   . 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 Leukemia, chronic lymphoid   . 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 Tonsillectomy and adenoidectomy   . Bone marrow transplant     FAMILY HISTORY Family History  Problem Relation Age of Onset  . Cancer Father    The patient's father died from complications of chronic lymphocytic leukemia at the age of 67.  It had been diagnosed seven years before when he was 58.  The patient is enrolled in a familial chronic lymphocytic leukemia study out of the [facility].  The patient's mother is 68, alive, unfortunately suffering with dementia, and he has a brother, 55, who is otherwise in fair health.   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  His wife of >40 years, Timothy [name], is a homemaker.  Their daughter,  Timothy [name], lives in [place].  She also is a Futures trader.  The patient has an 84 year old grandson and an 32-year-old granddaughter, and that is really the main reason he moved to this area.  He is a International aid/development worker.     ADVANCED DIRECTIVES:  HEALTH MAINTENANCE: History  Substance Use Topics  . Smoking status: Never Smoker   . Smokeless tobacco: Never Used  . Alcohol Use: No     Colonoscopy:  PAP:  Bone density:  Lipid panel:  Allergies  Allergen Reactions  . Benadryl (Diphenhydramine Hcl)     "Restless leg syndrome"    Current Outpatient Prescriptions  Medication Sig Dispense Refill  . Alum & Mag Hydroxide-Simeth (MAGIC MOUTHWASH W/LIDOCAINE) SOLN Take 5 mLs by mouth 4 (four) times daily as needed. Mouth sores      . antiseptic oral rinse (BIOTENE) LIQD 15 mLs by Mouth Rinse route 2 times daily at 12 noon and 4 pm.  237 mL  6  . Calcium Citrate (CITRACAL PO) Take 1,200 mg by mouth 2 (two) times daily.      . chlorhexidine (PERIDEX) 0.12 % solution       . cholecalciferol (VITAMIN D) 1000 UNITS tablet Take 1,000 Units by mouth daily.      Marland Kitchen dexamethasone 0.5 MG/5ML elixir Take 1 mg by mouth daily. Rinse mouth 4-6 times daily and spit out      . dextrose 5 % SOLN 50 mL with magnesium sulfate 50 % SOLN 1 g Inject 1 g into the vein 3 (three) times a week. Gets this at [facility]      . filgrastim (NEUPOGEN) 480 MCG/1.6ML injection Inject 480 mcg into the skin 3 (three) times a week.       . heparin lock flush 100 UNIT/ML SOLN Use as directed for hickman catheter care  60 Syringe  3  . labetalol (NORMODYNE) 200 MG tablet Take 200 mg by mouth 2 (two) times daily.      Marland Kitchen levofloxacin (LEVAQUIN) 750 MG tablet Take 750 mg by mouth daily.      Marland Kitchen lisinopril (PRINIVIL,ZESTRIL) 10 MG tablet Take 10 mg by mouth at bedtime.      Marland Kitchen LORazepam (ATIVAN) 0.5 MG tablet Take 0.5 mg by mouth every 6 (six) hours as needed. anxiety      . Multiple Vitamins-Minerals (CENTRUM SILVER ADULT 50+) TABS Take 1 tablet by  mouth daily.      . Omega-3 Fatty Acids (FISH OIL) 300 MG CAPS Take 600 mg by mouth daily.      Marland Kitchen omeprazole (PRILOSEC) 20 MG capsule Take 20 mg by mouth daily.      . ondansetron (ZOFRAN-ODT) 8 MG disintegrating tablet Take 1 tablet (8 mg total) by mouth 2 (two) times daily.  30 tablet  1  . oxycodone (OXY-IR) 5 MG capsule Take 1 capsule (5 mg total) by mouth every 6 (six) hours as needed. Pain  30 capsule  0  . predniSONE (DELTASONE) 5 MG tablet Take 1 tablet (5 mg total)  by mouth 2 (two) times daily. Take 2 tablets (10 mg) with breakfast, one tablet (5 mg) with supper  90 tablet  3  . prochlorperazine (COMPAZINE) 10 MG tablet Take 10 mg by mouth every 6 (six) hours as needed. Nausea      . sertraline (ZOLOFT) 50 MG tablet Take 50-100 mg by mouth daily. Takes 50 mg one day and 100 mg the next day      . Sodium Chloride Flush 0.9 % SOLN Use as directed for hickman catheter flushes  60 Syringe  3  . sulfamethoxazole-trimethoprim (BACTRIM DS) 800-160 MG per tablet Take 1 tablet by mouth daily.  30 tablet  1  . tacrolimus (PROGRAF) 0.5 MG capsule Take 1 mg by mouth every morning. Takes with 1 mg capsule      . tacrolimus (PROGRAF) 1 MG capsule Take 1.5 mg by mouth at bedtime. Takes with a 0.5 mg capsule      . ursodiol (ACTIGALL) 300 MG capsule Take 600 mg by mouth 2 (two) times daily.      Marland Kitchen VALCYTE 450 MG tablet        No current facility-administered medications for this visit.   Facility-Administered Medications Ordered in Other Visits  Medication Dose Route Frequency Provider Last Rate Last Dose  . filgrastim (NEUPOGEN) injection 480 mcg  480 mcg Subcutaneous Once Dreyden Rohrman G Iosefa Weintraub, PA      . sodium chloride 0.9 % 1,000 mL with magnesium sulfate 1 g infusion   Intravenous Continuous Georgana Romain G Hamda Klutts, PA      . sodium chloride 0.9 % injection 10 mL  10 mL Intravenous PRN Lowella Dell, MD   10 mL at 08/11/12 1606    OBJECTIVE: Middle-aged white male who appears tired but in no acute  distress  Filed Vitals:   09/22/12 1116  BP: 132/70  Pulse: 80  Temp: 98.4 F (36.9 C)  Resp: 20     Body mass index is 29.63 kg/(m^2).    Filed Weights   09/22/12 1116  Weight: 194 lb 14.4 oz (88.406 kg)   ECOG FS: 2  Sclerae unicteric  Oropharynx clear with no lesions or thrush No cervical or supraclavicular adenopathy  Lungs clear to auscultation,  no rales or rhonchi  Heart regular rate and rhythm, no murmur appreciated Abd benign, specifically +BS, soft, nontender to palpation  MSK  1+ pitting edema bilaterally in the lower extremities, equal bilaterally. No upper extremity edema noted Neuro: nonfocal, alert and oriented x 3,  Skin:  No evident rash    LAB RESULTS:  CMV Negative   09/15/2012  Positive, <200  09/08/2012   Lab Results  Component Value Date   WBC 6.5 09/22/2012   NEUTROABS 4.0 09/22/2012   HGB 11.1* 09/22/2012   HCT 33.6* 09/22/2012   MCV 95.7 09/22/2012   PLT 105* 09/22/2012      Chemistry      Component Value Date/Time   NA 137 09/15/2012 1514   NA 136 08/26/2012 0540   K 4.0 09/15/2012 1514   K 3.7 08/26/2012 0540   CL 106 09/15/2012 1514   CL 107 08/26/2012 0540   CO2 23 09/15/2012 1514   CO2 21 08/26/2012 0540   BUN 13.0 09/15/2012 1514   BUN 8 08/26/2012 0540   CREATININE 1.1 09/15/2012 1514   CREATININE 1.03 08/26/2012 0540      Component Value Date/Time   CALCIUM 8.9 09/15/2012 1514   CALCIUM 8.2* 08/26/2012 0540   ALKPHOS 80 09/15/2012  1514   ALKPHOS 84 08/26/2012 0540   AST 19 09/15/2012 1514   AST 18 08/26/2012 0540   ALT 10 09/15/2012 1514   ALT 7 08/26/2012 0540   BILITOT 0.40 09/15/2012 1514   BILITOT 0.2* 08/26/2012 0540     IgG:  496 09/15/2012 IgA: 23 09/15/2012  IgM: 17 09/25/213  LDH: 179 09/15/2012   Lipid Panel     Component Value Date/Time   CHOL 225* 08/20/2012 1341   TRIG 301* 08/20/2012 1341   HDL 23* 08/20/2012 1341   CHOLHDL 9.8 08/20/2012 1341   VLDL 60* 08/20/2012 1341   LDLCALC 142* 08/20/2012 1341   Results for CHALMER, MACNAUGHTON (MRN [id number]) as of 09/11/2012 12:51  Ref. Range 07/21/2012 10:20 07/28/2012 15:22 08/04/2012 14:45 08/06/2012 15:10 08/11/2012 09:17  Tacrolimus Lvl Latest Range: 5.0-20.0 ng/mL 3.9 (L) 9.7 9.0 6.9 6.9   Magnesium was 1.9 on 09/20/2012.   STUDIES: Ct Soft Tissue Neck W Contrast  09/02/2012  *RADIOLOGY REPORT*  Clinical Data: Restaging of leukemia.  Status post ALIF genic transplant.  CT NECK WITH CONTRAST  Technique:  Multidetector CT imaging of the neck was performed with intravenous contrast.  Contrast: OMNIPAQUE IOHEXOL 300 MG/ML  SOLN  Comparison: None.  Findings: Limited imaging of the brain is unremarkable.  No focal mucosal or submucosal lesions are present.  The vocal cords are midline and symmetric.  The thyroid is unremarkable. The soft tissues are within normal limits.  A right IJ catheter is in place.  No significant cervical adenopathy is present.  A left pleural effusion is present.  The lung apices are otherwise clear.  Bone windows demonstrate multilevel cervical spondylosis, most significant at C3-4, worse on the right.  IMPRESSION:  1.  No significant cervical adenopathy. 2.  Left pleural effusion. 3.  Moderate cervical spondylosis at C3-4.   Original Report Authenticated By: Jamesetta Orleans. MATTERN, M.D.    Ct Chest W Contrast  09/02/2012  *RADIOLOGY REPORT*  Clinical Data:  Restaging of chronic lymphocytic leukemia.  Status post bone marrow transplant 03/13.  Bone marrow biopsy 09/01/2012.  CT CHEST, ABDOMEN AND PELVIS WITH CONTRAST  Technique: Contiguous axial images of the chest abdomen and pelvis were obtained after IV contrast administration.  Contrast: 125 ml Omnipaque-300  Comparison: Plain film chest of 08/04/2012.  CT CHEST  Findings: Lung windows demonstrate patchy bibasilar atelectasis. Concurrent ground-glass opacity at the right lung base is favored also represent atelectasis.  Soft tissue windows demonstrate no supraclavicular adenopathy.  A right-sided Port-A-Cath  which terminates at the high right atrium. No axillary adenopathy.  Mild cardiomegaly.  Small pericardial effusion. No central pulmonary embolism, on this non-dedicated study.  Coronary artery atherosclerosis.  Small left and trace right pleural effusion. No mediastinal or hilar adenopathy.  Fractures of the 9th through 11th posterior left ribs.  Callus deposition more medially at the ninth through twelfth ribs is also likely related to remote trauma.  Mild T12 compression deformity without canal compromise. Borderline loss of vertebral body height at T8-T9.  IMPRESSION:  1.  Left-sided rib fractures which are nonacute and favored to be post-traumatic. 2.  Mild T12 compression deformity without significant canal compromise. 3.  No adenopathy within the chest. 4.  Small left greater than right pleural effusions with small pericardial effusion. 5.  Bibasilar atelectasis; patchy ground-glass opacity the right lung base is also favored to represent atelectasis.  Atypical infection is felt less likely.  CT ABDOMEN AND PELVIS  Findings:  Scattered too small to  characterize liver lesions which are likely cysts.  Splenomegaly, with the spleen measuring 13.2 cm cranial caudal and 14.1x 9.8 cm transverse.  Normal stomach.  The gallbladder is borderline distended, without stone or specific evidence of acute cholecystitis.  No biliary ductal dilatation.  Normal adrenal glands and left kidney.  Too small to characterize right renal lesions.  They are increased number of small retroperitoneal nodes. Not pathologic by size criteria.  Under distended sigmoid and descending colon. Scattered colonic diverticula.  Normal terminal ileum. Normal small bowel without abdominal ascites.  Bilateral fat containing inguinal hernias.  A right external iliac node measures 1.0 cm on image 114. A left external iliac node measures 1.1 cm on image 107  Normal urinary bladder and prostate.  Trace cul-de-sac fluid; image 110.  A lucent lesion within  the right iliac measures 1.9 cm on image 99.  Partial fusion of the left sacroiliac joint, likely degenerative.  IMPRESSION:  1.  Mild pelvic adenopathy.  Increased number of small abdominal retroperitoneal nodes. 2.  Small volume cul-de-sac fluid, nonspecific. 3.  Splenomegaly.  This is nonspecific in the setting of lymphoma/leukemia.  Splenic involvement cannot be excluded. 4.  Right iliac wing lucent lesion.  When correlated with yesterday's biopsy, this is likely the site of sampling.   Original Report Authenticated By: Consuello Bossier, M.D.    Ct Abdomen Pelvis W Contrast  09/02/2012  *RADIOLOGY REPORT*  Clinical Data:  Restaging of chronic lymphocytic leukemia.  Status post bone marrow transplant 03/13.  Bone marrow biopsy 09/01/2012.  CT CHEST, ABDOMEN AND PELVIS WITH CONTRAST  Technique: Contiguous axial images of the chest abdomen and pelvis were obtained after IV contrast administration.  Contrast: 125 ml Omnipaque-300  Comparison: Plain film chest of 08/04/2012.  CT CHEST  Findings: Lung windows demonstrate patchy bibasilar atelectasis. Concurrent ground-glass opacity at the right lung base is favored also represent atelectasis.  Soft tissue windows demonstrate no supraclavicular adenopathy.  A right-sided Port-A-Cath which terminates at the high right atrium. No axillary adenopathy.  Mild cardiomegaly.  Small pericardial effusion. No central pulmonary embolism, on this non-dedicated study.  Coronary artery atherosclerosis.  Small left and trace right pleural effusion. No mediastinal or hilar adenopathy.  Fractures of the 9th through 11th posterior left ribs.  Callus deposition more medially at the ninth through twelfth ribs is also likely related to remote trauma.  Mild T12 compression deformity without canal compromise. Borderline loss of vertebral body height at T8-T9.  IMPRESSION:  1.  Left-sided rib fractures which are nonacute and favored to be post-traumatic. 2.  Mild T12 compression deformity  without significant canal compromise. 3.  No adenopathy within the chest. 4.  Small left greater than right pleural effusions with small pericardial effusion. 5.  Bibasilar atelectasis; patchy ground-glass opacity the right lung base is also favored to represent atelectasis.  Atypical infection is felt less likely.  CT ABDOMEN AND PELVIS  Findings:  Scattered too small to characterize liver lesions which are likely cysts.  Splenomegaly, with the spleen measuring 13.2 cm cranial caudal and 14.1x 9.8 cm transverse.  Normal stomach.  The gallbladder is borderline distended, without stone or specific evidence of acute cholecystitis.  No biliary ductal dilatation.  Normal adrenal glands and left kidney.  Too small to characterize right renal lesions.  They are increased number of small retroperitoneal nodes. Not pathologic by size criteria.  Under distended sigmoid and descending colon. Scattered colonic diverticula.  Normal terminal ileum. Normal small bowel without abdominal ascites.  Bilateral fat containing inguinal hernias.  A right external iliac node measures 1.0 cm on image 114. A left external iliac node measures 1.1 cm on image 107  Normal urinary bladder and prostate.  Trace cul-de-sac fluid; image 110.  A lucent lesion within the right iliac measures 1.9 cm on image 99.  Partial fusion of the left sacroiliac joint, likely degenerative.  IMPRESSION:  1.  Mild pelvic adenopathy.  Increased number of small abdominal retroperitoneal nodes. 2.  Small volume cul-de-sac fluid, nonspecific. 3.  Splenomegaly.  This is nonspecific in the setting of lymphoma/leukemia.  Splenic involvement cannot be excluded. 4.  Right iliac wing lucent lesion.  When correlated with yesterday's biopsy, this is likely the site of sampling.   Original Report Authenticated By: Consuello Bossier, M.D.    Ct Biopsy  09/01/2012  *RADIOLOGY REPORT*  Clinical Data:  CLL  CT GUIDED RIGHT ILIAC BONE MARROW ASPIRATION AND CORE BIOPSY  Date:   09/01/2012 09:00:00  Radiologist:  M. Ruel Favors, M.D.  Medications:  2 mg Versed, 100 mcg Fentanyl  Guidance:  CT  Sedation time:  15 minutes  Contrast volume:  None.  Complications:  No immediate  PROCEDURE/FINDINGS:  Informed consent was obtained from the patient following explanation of the procedure, risks, benefits and alternatives. The patient understands, agrees and consents for the procedure. All questions were addressed.  A time out was performed.  The patient was positioned prone and noncontrast localization CT was performed of the pelvis to demonstrate the iliac marrow spaces.  Maximal barrier sterile technique utilized including caps, mask, sterile gowns, sterile gloves, large sterile drape, hand hygiene, and betadine prep.  Under sterile conditions and local anesthesia, an 11 gauge coaxial bone biopsy needle was advanced into the right iliac marrow space. Needle position was confirmed with CT imaging. Initially, bone marrow aspiration was performed. Next, the 11 gauge outer cannula was utilized to obtain a right iliac bone marrow core biopsy. Needle was removed. Hemostasis was obtained with compression. The patient tolerated the procedure well. Samples were prepared with the cytotechnologist. No immediate complications.  IMPRESSION: CT guided right iliac bone marrow aspiration and core biopsy.   Original Report Authenticated By: Judie Petit. Ruel Favors, M.D.    PATHOLOGY: Patient Name: Timothy, [name] Accession #: [id number] DOB: 1946/03/20 Age: 39 Gender: M Client Name [facility] Collected Date: 09/01/2012 Received Date: 09/01/2012 Physician:Michael Ruel Favors, MD Chart #: MRN # : [id number] Physician cc: Ruthann Cancer, MD Race:W Visit #: [id number].[place] BONE MARROW REPORT FINAL DIAGNOSIS Diagnosis Bone Marrow, Aspirate,Biopsy, and Clot, right iliac - HYPERCELLULAR BONE MARROW FOR AGE WITH TRILINEAGE HEMATOPOIESIS. - SEE COMMENT. PERIPHERAL BLOOD: - NORMOCYTIC-NORMOCHROMIC  ANEMIA. - LYMPHOPENIA - THROMBOCYTOPENIA. Diagnosis Note The bone marrow is generally hypercellular for age with trilineage hematopoiesis and relative abundance of granulocytic cells likely related to the history of growth factor therapy. This is associated with subtle dyspoietic changes essentially involving all cell lines to variable extent. No morphologic increase in blastic cells is identified. I suspect that the latter changes are related to previous therapy and or bone marrow transplant but correlation with cytogenetic studies is recommended. The lymphoid component is generally inconspicuous in the marrow and mostly consists of occasional very small lymphoid aggregates mostly composed of small lymphocytes. Flow cytometric analysis failed to show any B-cell component. Hence, there is no immunophenotypic evidence of a B-cell lymphoproliferative process in this material. Clinical correlation is recommended. (BNS:eps 09/02/12) Guerry Bruin MD Pathologist, Electronic Signature (Case signed 09/02/2012)  Patient Name:  Timothy, [name] Accession #: [id number] DOB: [birth year] Age: 66 Gender: M Client Name [facility] Collected Date: 09/01/2012 Received Date: 09/01/2012 Physician:Michael Ruel Favors, MD Chart #: MRN # : [id number] Physician cc: Race:W Visit #: [id number].[place] FLOW CYTOMETRY REPORT INTERPRETATION Interpretation Bone Marrow Flow Cytometry - PREDOMINANCE OF T-CELLS WITH NONSPECIFIC REVERSAL OF CD4:CD8 RATIO - NO ABNORMAL B-CELL POPULATION IDENTIFIED Diagnosis Comment: B-cells are essentially absent and hence there is no evidence of a B-cell lymphoproliferative process Guerry Bruin MD Pathologist, Electronic Signature (Case signed 09/02/2012)   ASSESSMENT: 66 y.o. [place] man with a history of well-differentiated lymphocytic lymphoma/ chronic lymphoid leukemia initially diagnosed in 2000, not requiring intervention until 2006; with multiple chromosomal  abnormalities.  His treatment history is as follows:  (1) fludarabine/cyclophosphamide/rituximab x5 completed May 2007.  (2) rituximab for 8 doses October 2010, with partial response  (3) Leustatin and ofatumumab weekly x8 July to September 2011 followed by maintenance ofatumumab maintenance ofatumumab every 2 months, with initial response but rising counts September 2012  (4) status-post unrelated donor stem-cell transplant 02/24/2012 at [facility]  (a) conditioning regimen consisted of fludarabine + TBI at 200 cGy, followed by rituximab x27;  (b) CMV reactivation x2 (patient CMV positive, donor negative), s/p ganciclovir treatment; 3d reactivation August 2013, s/p gancyclovir, with negative PCR mid-September 2013 (c) GVHD: involving gut and skin, treated with steroids, tacrolimus and MMF  (d) atrial fibrillation: resolved on amiodarone  (e) steroid-induced myopathy: improving  (f) hypomagnesemia  (g) hypogammaglobulinemia: s/p IVIG 07/09/2012  (h) elevated triglycerides (606 on 07/14/2012)  (i) adrenal insufficiency (5) restaging studies September 2013 detailed above including CT scans, flow cytometry, and bone marrow biopsy, showed no evidence of residual chronic lymphoid leukemia.  PLAN: Lamarco will continue to receive supportive IV fluids with IV magnesium on Mondays, Wednesdays, and Fridays, at least until we discontinue his ganciclovir. We are hopeful that he will only be only ganciclovir total of 3 weeks. He has completed 7 days with twice a day dosing, and will decrease to once daily dosing now for at least 14 days. Of course in the meanwhile, we will continue to check his CMV PCR every Wednesday. He'll continue to receive Neupogen injections on Mondays, Wednesdays, and Fridays as well, until his ANC is 7.5 or above.  Abdikadir will continue on the prednisone, but we will start a very slow taper. He will continue taking 10 mg in the morning but we'll decrease his afternoon dose to 2 1/2  mg.   We will likely go to 7-1/2 and 2-1/2 mg after 2 weeks and then perhaps to 5 and 2-1/2 after another 2 weeks.   He is slowly tapering his dose of tacrolimus as suggested by [facility], his current dose being 1 mg in the morning and 1 point 5 in the evening. On October 16 we will go to 1.0 mg twice a day, on November 15 a half a milligram twice a day, and on December 15 half a milligram in the morning only. This assuming of course there is no exacerbation of his graft-versus-host disease.  Other ongoing issues include:  (1) chronic GVHD prophylaxis: On a lower dose of tacrolimus, and now low-dose prednisone as well  (2) CMV reactivation: resumed ganciclovir as noted above ; we will continue to monitor her CMV weekly  (3) hypogammaglobulinemia: received IgG at 663 mg/kg 08/18/2012; follow with weekly labs   (4)   Hypomagnesemia:  Continuing with IV replacement 3 times weekly; we will try to go to twice a week  and then once a week after stopping ganciclovir  (5) immunocompromise: continue prophylaxis with zovirax, diflucan, levofloxacin and Bactrim;  Receives Neupogen, 480 mcg 3 times a week,  continuing until the ANC is 7.5, as noted above  (6)  CGVHD assessment weekly initially, then at broadening intervals,    As always, they know to call with any changes or problems.   Zollie Scale    09/22/2012

## 2012-09-22 NOTE — Telephone Encounter (Signed)
ADD 2 Hr infusion after AB on 10/16; lab and 2 hr infusion on 10/14 and 10/18  Sent michelle email to patient treatment

## 2012-09-22 NOTE — Patient Instructions (Signed)
[facility] Discharge Instructions for Patients Receiving Chemotherapy  Today you received the following chemotherapy agents IV Fluids with Magnesium.   To help prevent nausea and vomiting after your treatment, we encourage you to take your nausea medication as prescribed.   If you develop nausea and vomiting that is not controlled by your nausea medication, call the clinic. If it is after clinic hours your family physician or the after hours number for the clinic or go to the Emergency Department.   BELOW ARE SYMPTOMS THAT SHOULD BE REPORTED IMMEDIATELY:  *FEVER GREATER THAN 100.5 F  *CHILLS WITH OR WITHOUT FEVER  NAUSEA AND VOMITING THAT IS NOT CONTROLLED WITH YOUR NAUSEA MEDICATION  *UNUSUAL SHORTNESS OF BREATH  *UNUSUAL BRUISING OR BLEEDING  TENDERNESS IN MOUTH AND THROAT WITH OR WITHOUT PRESENCE OF ULCERS  *URINARY PROBLEMS  *BOWEL PROBLEMS  UNUSUAL RASH Items with * indicate a potential emergency and should be followed up as soon as possible.  One of the nurses will contact you 24 hours after your treatment. Please let the nurse know about any problems that you may have experienced. Feel free to call the clinic you have any questions or concerns. The clinic phone number is [phone].

## 2012-09-23 LAB — IGG, IGA, IGM
IgA: <6 mg/dL — ABNORMAL LOW (ref 68–379)
IgG (Immunoglobin G), Serum: 406 mg/dL — ABNORMAL LOW (ref 650–1600)
IgM, Serum: <5 mg/dL — ABNORMAL LOW (ref 41–251)

## 2012-09-24 ENCOUNTER — Encounter: Payer: Self-pay | Admitting: Physician Assistant

## 2012-09-24 ENCOUNTER — Other Ambulatory Visit (HOSPITAL_BASED_OUTPATIENT_CLINIC_OR_DEPARTMENT_OTHER): Payer: BC Managed Care – PPO

## 2012-09-24 ENCOUNTER — Other Ambulatory Visit: Payer: Self-pay | Admitting: Physician Assistant

## 2012-09-24 ENCOUNTER — Other Ambulatory Visit: Payer: Self-pay | Admitting: *Deleted

## 2012-09-24 ENCOUNTER — Ambulatory Visit (HOSPITAL_BASED_OUTPATIENT_CLINIC_OR_DEPARTMENT_OTHER): Payer: BC Managed Care – PPO

## 2012-09-24 DIAGNOSIS — C9110 Chronic lymphocytic leukemia of B-cell type not having achieved remission: Secondary | ICD-10-CM

## 2012-09-24 DIAGNOSIS — D709 Neutropenia, unspecified: Secondary | ICD-10-CM

## 2012-09-24 DIAGNOSIS — C9590 Leukemia, unspecified not having achieved remission: Secondary | ICD-10-CM

## 2012-09-24 DIAGNOSIS — R768 Other specified abnormal immunological findings in serum: Secondary | ICD-10-CM

## 2012-09-24 DIAGNOSIS — D89811 Chronic graft-versus-host disease: Secondary | ICD-10-CM

## 2012-09-24 DIAGNOSIS — R894 Abnormal immunological findings in specimens from other organs, systems and tissues: Secondary | ICD-10-CM

## 2012-09-24 DIAGNOSIS — E860 Dehydration: Secondary | ICD-10-CM

## 2012-09-24 DIAGNOSIS — Z9489 Other transplanted organ and tissue status: Secondary | ICD-10-CM

## 2012-09-24 DIAGNOSIS — B259 Cytomegaloviral disease, unspecified: Secondary | ICD-10-CM

## 2012-09-24 DIAGNOSIS — D849 Immunodeficiency, unspecified: Secondary | ICD-10-CM

## 2012-09-24 LAB — CBC WITH DIFFERENTIAL/PLATELET
BASO%: 0.7 % (ref 0.0–2.0)
EOS%: 0.8 % (ref 0.0–7.0)
HGB: 10.8 g/dL — ABNORMAL LOW (ref 13.0–17.1)
MCH: 32.9 pg (ref 27.2–33.4)
MCHC: 33.6 g/dL (ref 32.0–36.0)
MONO#: 0.4 10*3/uL (ref 0.1–0.9)
RDW: 17.4 % — ABNORMAL HIGH (ref 11.0–14.6)
WBC: 5.4 10*3/uL (ref 4.0–10.3)
lymph#: 1.1 10*3/uL (ref 0.9–3.3)

## 2012-09-24 MED ORDER — FILGRASTIM 480 MCG/0.8ML IJ SOLN
480.0000 ug | Freq: Once | INTRAMUSCULAR | Status: AC
  Administered 2012-09-24: 480 ug via SUBCUTANEOUS
  Filled 2012-09-24: qty 0.8

## 2012-09-24 MED ORDER — SODIUM CHLORIDE 0.9 % IV SOLN
Freq: Once | INTRAVENOUS | Status: AC
  Administered 2012-09-24: 14:00:00 via INTRAVENOUS
  Filled 2012-09-24: qty 1000

## 2012-09-24 NOTE — Progress Notes (Signed)
Per instructions from [facility], patient may discontinue ursidiol at this time.  Patient was informed of this change.  Repeat labs are scheduled for next week, including a CMET with liver enzymes.  Zollie Scale, PA-C 09/24/2012

## 2012-09-27 ENCOUNTER — Other Ambulatory Visit (HOSPITAL_BASED_OUTPATIENT_CLINIC_OR_DEPARTMENT_OTHER): Payer: BC Managed Care – PPO

## 2012-09-27 ENCOUNTER — Ambulatory Visit (HOSPITAL_BASED_OUTPATIENT_CLINIC_OR_DEPARTMENT_OTHER): Payer: BC Managed Care – PPO

## 2012-09-27 VITALS — BP 141/86 | HR 73 | Temp 98.0°F

## 2012-09-27 DIAGNOSIS — C9590 Leukemia, unspecified not having achieved remission: Secondary | ICD-10-CM

## 2012-09-27 DIAGNOSIS — D89811 Chronic graft-versus-host disease: Secondary | ICD-10-CM

## 2012-09-27 DIAGNOSIS — C9110 Chronic lymphocytic leukemia of B-cell type not having achieved remission: Secondary | ICD-10-CM

## 2012-09-27 DIAGNOSIS — Z9489 Other transplanted organ and tissue status: Secondary | ICD-10-CM

## 2012-09-27 DIAGNOSIS — D709 Neutropenia, unspecified: Secondary | ICD-10-CM

## 2012-09-27 DIAGNOSIS — E860 Dehydration: Secondary | ICD-10-CM

## 2012-09-27 DIAGNOSIS — D849 Immunodeficiency, unspecified: Secondary | ICD-10-CM

## 2012-09-27 LAB — CBC WITH DIFFERENTIAL/PLATELET
BASO%: 0 % (ref 0.0–2.0)
Basophils Absolute: 0 10*3/uL (ref 0.0–0.1)
EOS%: 1.1 % (ref 0.0–7.0)
Eosinophils Absolute: 0 10*3/uL (ref 0.0–0.5)
HCT: 32.4 % — ABNORMAL LOW (ref 38.4–49.9)
HGB: 10.4 g/dL — ABNORMAL LOW (ref 13.0–17.1)
LYMPH%: 32 % (ref 14.0–49.0)
MCH: 30.5 pg (ref 27.2–33.4)
MCHC: 32.1 g/dL (ref 32.0–36.0)
MCV: 95 fL (ref 79.3–98.0)
MONO#: 0.2 10*3/uL (ref 0.1–0.9)
MONO%: 9.1 % (ref 0.0–14.0)
NEUT#: 1 10*3/uL — ABNORMAL LOW (ref 1.5–6.5)
NEUT%: 57.8 % (ref 39.0–75.0)
Platelets: 76 10*3/uL — ABNORMAL LOW (ref 140–400)
RBC: 3.41 10*6/uL — ABNORMAL LOW (ref 4.20–5.82)
RDW: 16.4 % — ABNORMAL HIGH (ref 11.0–14.6)
WBC: 1.8 10*3/uL — ABNORMAL LOW (ref 4.0–10.3)
lymph#: 0.6 10*3/uL — ABNORMAL LOW (ref 0.9–3.3)

## 2012-09-27 MED ORDER — HEPARIN SOD (PORK) LOCK FLUSH 100 UNIT/ML IV SOLN
500.0000 [IU] | Freq: Once | INTRAVENOUS | Status: AC
  Administered 2012-09-27: 250 [IU] via INTRAVENOUS
  Filled 2012-09-27: qty 5

## 2012-09-27 MED ORDER — SODIUM CHLORIDE 0.9 % IJ SOLN
10.0000 mL | INTRAMUSCULAR | Status: DC | PRN
  Administered 2012-09-27: 10 mL via INTRAVENOUS
  Filled 2012-09-27: qty 10

## 2012-09-27 MED ORDER — SODIUM CHLORIDE 0.9 % IV SOLN
Freq: Once | INTRAVENOUS | Status: AC
  Administered 2012-09-27: 16:00:00 via INTRAVENOUS
  Filled 2012-09-27: qty 1000

## 2012-09-27 MED ORDER — FILGRASTIM 480 MCG/0.8ML IJ SOLN
480.0000 ug | Freq: Once | INTRAMUSCULAR | Status: AC
  Administered 2012-09-27: 480 ug via SUBCUTANEOUS
  Filled 2012-09-27: qty 0.8

## 2012-09-27 MED ORDER — SODIUM CHLORIDE 0.9 % IV SOLN
INTRAVENOUS | Status: DC
  Administered 2012-09-27: 15:00:00 via INTRAVENOUS

## 2012-09-27 NOTE — Patient Instructions (Addendum)
Hypomagnesemia Magnesium is a common ion (mineral) in the body which is needed for metabolism. It is about how the body handles food and other chemical reactions necessary for life. Only about 2% of the magnesium in our body is found in the blood. When this is low, it is called hypomagnesemia. The blood will measure only a tiny amount of the magnesium in our body. When it is low in our blood, it does not mean that the whole body supply is low. The normal serum concentration ranges from 1.8-2.5 mEq/L. When the level gets to be less than 1.0 mEq/L, a number of problems begin to happen.  CAUSES   Receiving intravenous fluids without magnesium replacement.  Loss of magnesium from the bowel by naso-gastric suction.  Loss of magnesium from nausea and vomiting or severe diarrhea. Any of the inflammatory bowel conditions can cause this.  Abuse of alcohol often leads to low serum magnesium.  An inherited form of magnesium loss happens when the kidneys lose magnesium. This is called familial or primary hypomagnesemia.  Some medications such as diuretics also cause the loss of magnesium. SYMPTOMS  These following problems are worse if the changes in magnesium levels come on suddenly.  Tremor.  Confusion.  Muscle weakness.  Over-sensitive to sights and sounds.  Sensitive reflexes.  Depression.  Muscular fibrillations.  Over-reactivity of the nerves.  Irritability.  Psychosis.  Spasms of the hand muscles.  Tetany (where the muscles go into uncontrollable spasms). DIAGNOSIS  This condition can be diagnosed by blood tests. TREATMENT   In emergency, magnesium can be given intravenously (by vein).  If the condition is less worrisome, it can be corrected by diet. High levels of magnesium are found in green leafy vegetables, peas, beans and nuts among other things. It can also be given through medications by mouth.  If it is being caused by medications, changes can be made.  If  alcohol is a problem, help is available if there are difficulties giving it up. Document Released: 09/03/2005 Document Revised: 03/01/2012 Document Reviewed: 07/28/2008 Exit[employer] Patient Information 2013 [employer].  

## 2012-09-29 ENCOUNTER — Ambulatory Visit (HOSPITAL_BASED_OUTPATIENT_CLINIC_OR_DEPARTMENT_OTHER): Payer: BC Managed Care – PPO

## 2012-09-29 ENCOUNTER — Encounter: Payer: Self-pay | Admitting: Physician Assistant

## 2012-09-29 ENCOUNTER — Other Ambulatory Visit (HOSPITAL_BASED_OUTPATIENT_CLINIC_OR_DEPARTMENT_OTHER): Payer: BC Managed Care – PPO | Admitting: Lab

## 2012-09-29 ENCOUNTER — Telehealth: Payer: Self-pay | Admitting: Oncology

## 2012-09-29 ENCOUNTER — Telehealth: Payer: Self-pay | Admitting: *Deleted

## 2012-09-29 ENCOUNTER — Ambulatory Visit (HOSPITAL_BASED_OUTPATIENT_CLINIC_OR_DEPARTMENT_OTHER): Payer: BC Managed Care – PPO | Admitting: Physician Assistant

## 2012-09-29 VITALS — BP 153/76 | HR 81 | Temp 98.1°F | Resp 20 | Ht 68.0 in | Wt 191.4 lb

## 2012-09-29 DIAGNOSIS — D89811 Chronic graft-versus-host disease: Secondary | ICD-10-CM

## 2012-09-29 DIAGNOSIS — D801 Nonfamilial hypogammaglobulinemia: Secondary | ICD-10-CM

## 2012-09-29 DIAGNOSIS — Z9489 Other transplanted organ and tissue status: Secondary | ICD-10-CM

## 2012-09-29 DIAGNOSIS — C9110 Chronic lymphocytic leukemia of B-cell type not having achieved remission: Secondary | ICD-10-CM

## 2012-09-29 DIAGNOSIS — D849 Immunodeficiency, unspecified: Secondary | ICD-10-CM

## 2012-09-29 DIAGNOSIS — R894 Abnormal immunological findings in specimens from other organs, systems and tissues: Secondary | ICD-10-CM

## 2012-09-29 DIAGNOSIS — Z5189 Encounter for other specified aftercare: Secondary | ICD-10-CM

## 2012-09-29 DIAGNOSIS — R112 Nausea with vomiting, unspecified: Secondary | ICD-10-CM

## 2012-09-29 DIAGNOSIS — R768 Other specified abnormal immunological findings in serum: Secondary | ICD-10-CM

## 2012-09-29 DIAGNOSIS — E2749 Other adrenocortical insufficiency: Secondary | ICD-10-CM

## 2012-09-29 DIAGNOSIS — C9590 Leukemia, unspecified not having achieved remission: Secondary | ICD-10-CM

## 2012-09-29 DIAGNOSIS — E860 Dehydration: Secondary | ICD-10-CM

## 2012-09-29 DIAGNOSIS — D709 Neutropenia, unspecified: Secondary | ICD-10-CM

## 2012-09-29 LAB — CBC WITH DIFFERENTIAL/PLATELET
Basophils Absolute: 0 10*3/uL (ref 0.0–0.1)
EOS%: 1.1 % (ref 0.0–7.0)
Eosinophils Absolute: 0 10*3/uL (ref 0.0–0.5)
HGB: 10.5 g/dL — ABNORMAL LOW (ref 13.0–17.1)
LYMPH%: 29.7 % (ref 14.0–49.0)
MCH: 31 pg (ref 27.2–33.4)
MCV: 94.7 fL (ref 79.3–98.0)
MONO%: 9.9 % (ref 0.0–14.0)
NEUT#: 1.5 10*3/uL (ref 1.5–6.5)
NEUT%: 58.5 % (ref 39.0–75.0)
Platelets: 97 10*3/uL — ABNORMAL LOW (ref 140–400)

## 2012-09-29 LAB — LACTATE DEHYDROGENASE (CC13): LDH: 281 U/L — ABNORMAL HIGH (ref 125–220)

## 2012-09-29 LAB — COMPREHENSIVE METABOLIC PANEL (CC13)
BUN: 14 mg/dL (ref 7.0–26.0)
CO2: 21 mEq/L — ABNORMAL LOW (ref 22–29)
Creatinine: 0.9 mg/dL (ref 0.7–1.3)
Glucose: 85 mg/dl (ref 70–99)
Total Bilirubin: 0.4 mg/dL (ref 0.20–1.20)
Total Protein: 5 g/dL — ABNORMAL LOW (ref 6.4–8.3)

## 2012-09-29 MED ORDER — VALGANCICLOVIR HCL 450 MG PO TABS: 900.0000 mg | ORAL_TABLET | Freq: Every day | ORAL | Status: DC

## 2012-09-29 MED ORDER — ONDANSETRON 8 MG PO TBDP
8.0000 mg | ORAL_TABLET | Freq: Once | ORAL | Status: AC
  Administered 2012-09-29: 8 mg via ORAL
  Filled 2012-09-29: qty 1

## 2012-09-29 MED ORDER — SODIUM CHLORIDE 0.9 % IV SOLN
INTRAVENOUS | Status: AC
  Administered 2012-09-29: 12:00:00 via INTRAVENOUS
  Filled 2012-09-29: qty 1000

## 2012-09-29 MED ORDER — FILGRASTIM 480 MCG/0.8ML IJ SOLN
480.0000 ug | Freq: Once | INTRAMUSCULAR | Status: AC
  Administered 2012-09-29: 480 ug via SUBCUTANEOUS
  Filled 2012-09-29: qty 0.8

## 2012-09-29 MED ORDER — LEVOFLOXACIN 750 MG PO TABS: 750.0000 mg | ORAL_TABLET | Freq: Every day | ORAL | Status: DC

## 2012-09-29 NOTE — Telephone Encounter (Signed)
gve the pt his oct,nov 2013 appt calendar. Pt is aware that his ivig appt will be added this Friday. Sent michelle a staff message

## 2012-09-29 NOTE — Telephone Encounter (Signed)
Per staff message and POF I have scheduled appts.  Timothy [name]  

## 2012-09-29 NOTE — Progress Notes (Signed)
ID: Timothy [name]   DOB: [birth year]  MR#: [id number]  FAO#:[id number]  HISTORY OF PRESENT ILLNESS: We have very complete records from Dr. Sydnee Levans in [place], and in summary:  The patient was initially diagnosed in August 2000, with a white cell count of 23,600, but normal hemoglobin and platelets, and no significant symptomatology. Over the next several years his white cell count drifted up, and he eventually developed some symptoms of night sweats in particular, leading to treatment with fludarabine, Cytoxan and rituxan for five cycles given between December 2006 and May 2007.  We have CT scans from June 2006, November 2006 and April 2007, and comparing the November 2006 and April 2007 scans, there was near complete response. He had subsequent therapy in [place] as detailed below, but with decreased response, leading to allogeneic stem-cell transplant at the [facility] 02/24/2012.  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   Interval history is remarkable for Gianluca having decreased his ganciclovir to once daily for the past 7 days. Fortunately, his  CMV DNA has been negative for 2 weeks, and pending a third negative reading today, we hope to be able to discontinue the ganciclovir altogether after another week. It does make him nauseous, and he finds ondansetron to be helpful. His appetite is fair.  Kabir also continues on prednisone, currently taking 10 mg in the morning and 2.5 in the afternoon.  This continues to help with his energy level. He is still walking on a regular basis and feels steadier on his feet.  REVIEW OF SYSTEMS: He remains weak, but less so as noted above. He's had no fevers, drenching night sweats, or chills.  He is not having any mouth sores or sore throat at present. He bruises easily, but denies any abnormal bleeding.  He denies unusual headaches, worsening  dizziness, worsening tremor, cough, phlegm production, pleurisy, or  shortness of breath.   He has some chronic back pain for which he utilizes oxycodone appropriately and effectively. He tends to be slightly constipated, but manages to have very small bowel movements at least every other day.   A detailed review of systems was otherwise stable.  PAST MEDICAL HISTORY: Past Medical History  Diagnosis Date  . Transplant recipient 66/22/2013  . 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 Diverticular disease   . Hyperlipidemia   . Obesity   . Hypertension   . Hiatal hernia   . CMV (cytomegalovirus) antibody positive     pre-transplant, with seroconversion x2 pst-transplant  . Right bundle branch block     pre-transplant  . CKD (chronic kidney disease) stage 2, GFR 60-89 ml/min   . Pancytopenia   . Steroid-induced diabetes   . Atrial fibrillation     post-transplant  . Myopathy   . 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 Leukemia, chronic lymphoid   . 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 Tonsillectomy and adenoidectomy   . Bone marrow transplant     FAMILY HISTORY Family History  Problem Relation Age of Onset  . Cancer Father    The patient's father died from complications of chronic lymphocytic leukemia at the age of 98.  It had been diagnosed seven years before when he was 3.  The patient is enrolled in a familial chronic lymphocytic leukemia study out of the [address]  [facility].  The patient's mother is 28, alive, unfortunately suffering with dementia, and he has a brother, 56, who is otherwise in fair health.   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  His wife of >40 years, [name], is a homemaker.  Their daughter, [name], lives in [place].  She also is a Futures trader.   The patient has an 49 year old grandson and an 84-year-old granddaughter, and that is really the main reason he moved to this area.  He is a International aid/development worker.     ADVANCED DIRECTIVES:  HEALTH MAINTENANCE: History  Substance Use Topics  . Smoking status: Never Smoker   . Smokeless tobacco: Never Used  . Alcohol Use: No     Colonoscopy:  PAP:  Bone density:  Lipid panel:  Allergies  Allergen Reactions  . Benadryl (Diphenhydramine Hcl)     "Restless leg syndrome"    Current Outpatient Prescriptions  Medication Sig Dispense Refill  . Alum & Mag Hydroxide-Simeth (MAGIC MOUTHWASH W/LIDOCAINE) SOLN Take 5 mLs by mouth 4 (four) times daily as needed. Mouth sores      . antiseptic oral rinse (BIOTENE) LIQD 15 mLs by Mouth Rinse route 2 times daily at 12 noon and 4 pm.  237 mL  6  . Calcium Citrate (CITRACAL PO) Take 1,200 mg by mouth 2 (two) times daily.      . chlorhexidine (PERIDEX) 0.12 % solution       . cholecalciferol (VITAMIN D) 1000 UNITS tablet Take 1,000 Units by mouth daily.      Marland Kitchen dexamethasone 0.5 MG/5ML elixir Take 1 mg by mouth daily. Rinse mouth 4-6 times daily and spit out      . dextrose 5 % SOLN 50 mL with magnesium sulfate 50 % SOLN 1 g Inject 1 g into the vein 3 (three) times a week. Gets [facility] at [facility]      . filgrastim (NEUPOGEN) 480 MCG/1.6ML injection Inject 480 mcg into the skin 3 (three) times a week.       . heparin lock flush 100 UNIT/ML SOLN Use as directed for hickman catheter care  60 Syringe  3  . labetalol (NORMODYNE) 200 MG tablet Take 200 mg by mouth 2 (two) times daily.      Marland Kitchen levofloxacin (LEVAQUIN) 750 MG tablet Take 1 tablet (750 mg total) by mouth daily.  30 tablet  1  . lisinopril (PRINIVIL,ZESTRIL) 10 MG tablet Take 10 mg by mouth at bedtime.      Marland Kitchen LORazepam (ATIVAN) 0.5 MG tablet Take 0.5 mg by mouth every 6 (six) hours as needed. anxiety      . Multiple Vitamins-Minerals (CENTRUM SILVER ADULT 50+) TABS Take 1 tablet by mouth daily.      . Omega-3  Fatty Acids (FISH OIL) 300 MG CAPS Take 600 mg by mouth daily.      Marland Kitchen omeprazole (PRILOSEC) 20 MG capsule Take 20 mg by mouth daily.      . ondansetron (ZOFRAN-ODT) 8 MG disintegrating tablet Take 1 tablet (8 mg total) by mouth 2 (two) times daily.  30 tablet  1  . oxycodone (OXY-IR) 5 MG capsule Take 1 capsule (5 mg total) by mouth every 6 (six) hours as needed. Pain  30 capsule  0  . predniSONE (DELTASONE) 5 MG tablet Take 1 tablet (5 mg total) by mouth 2 (two) times daily. Take 2 tablets (10 mg)  with breakfast, one tablet (5 mg) with supper  90 tablet  3  . prochlorperazine (COMPAZINE) 10 MG tablet Take 10 mg by mouth every 6 (six) hours as needed. Nausea      . sertraline (ZOLOFT) 50 MG tablet Take 50-100 mg by mouth daily. Takes 50 mg one day and 100 mg the next day      . Sodium Chloride Flush 0.9 % SOLN Use as directed for hickman catheter flushes  60 Syringe  3  . sulfamethoxazole-trimethoprim (BACTRIM DS) 800-160 MG per tablet Take 1 tablet by mouth. Takes twice per week      . tacrolimus (PROGRAF) 0.5 MG capsule Take 1 mg by mouth every morning. Takes with 1 mg capsule      . tacrolimus (PROGRAF) 1 MG capsule Take 1.5 mg by mouth at bedtime. Takes with a 0.5 mg capsule      . valGANciclovir (VALCYTE) 450 MG tablet Take 2 tablets (900 mg total) by mouth daily.  60 tablet  0   Current Facility-Administered Medications  Medication Dose Route Frequency Provider Last Rate Last Dose  . ondansetron (ZOFRAN-ODT) disintegrating tablet 8 mg  8 mg Oral Once Catalina Gravel, PA   8 mg at 09/29/12 1245   Facility-Administered Medications Ordered in Other Visits  Medication Dose Route Frequency Provider Last Rate Last Dose  . filgrastim (NEUPOGEN) injection 480 mcg  480 mcg Subcutaneous Once Kalii Chesmore G Philippa Vessey, PA      . sodium chloride 0.9 % 1,000 mL with magnesium sulfate 1 g infusion   Intravenous Continuous Catalina Gravel, PA 500 mL/hr at 09/29/12 1224    . sodium chloride 0.9 % injection 10 mL  10 mL  Intravenous PRN Lowella Dell, MD   10 mL at 08/11/12 1606    OBJECTIVE: Middle-aged white male who appears tired but in no acute distress  Filed Vitals:   09/29/12 1047  BP: 153/76  Pulse: 81  Temp: 98.1 F (36.7 C)  Resp: 20     Body mass index is 29.10 kg/(m^2).    Filed Weights   09/29/12 1047  Weight: 191 lb 6.4 oz (86.818 kg)   ECOG FS: 2  Sclerae unicteric  Oropharynx clear with no lesions or thrush No cervical or supraclavicular adenopathy  Lungs clear to auscultation,  no rales or rhonchi  Heart regular rate and rhythm, no murmur appreciated Abd benign, specifically +BS, soft, nontender to palpation  MSK  1+ pitting edema bilaterally in the lower extremities, equal bilaterally. No upper extremity edema noted Neuro: nonfocal, alert and oriented x 3,  Skin:  No evident rash    LAB RESULTS:  CMV Negative  09/24/2012  Negative   09/15/2012  Positive, <200  09/08/2012   Lab Results  Component Value Date   WBC 2.6* 09/29/2012   NEUTROABS 1.5 09/29/2012   HGB 10.5* 09/29/2012   HCT 32.1* 09/29/2012   MCV 94.7 09/29/2012   PLT 97* 09/29/2012      Chemistry      Component Value Date/Time   NA 137 09/29/2012 1024   NA 136 08/26/2012 0540   K 3.8 09/29/2012 1024   K 3.7 08/26/2012 0540   CL 106 09/29/2012 1024   CL 107 08/26/2012 0540   CO2 21* 09/29/2012 1024   CO2 21 08/26/2012 0540   BUN 14.0 09/29/2012 1024   BUN 8 08/26/2012 0540   CREATININE 0.9 09/29/2012 1024   CREATININE 1.03 08/26/2012 0540      Component Value  Date/Time   CALCIUM 8.8 09/29/2012 1024   CALCIUM 8.2* 08/26/2012 0540   ALKPHOS 60 09/29/2012 1024   ALKPHOS 84 08/26/2012 0540   AST 20 09/29/2012 1024   AST 18 08/26/2012 0540   ALT 8 09/29/2012 1024   ALT 7 08/26/2012 0540   BILITOT 0.40 09/29/2012 1024   BILITOT 0.2* 08/26/2012 0540     IgG:  409 09/22/2012 IgA: <6 09/22/2012  IgM: <5 09/22/2012  LDH: 281 09/29/2012   Lipid Panel     Component Value Date/Time   CHOL 225* 08/20/2012 1341   TRIG  301* 08/20/2012 1341   HDL 23* 08/20/2012 1341   CHOLHDL 9.8 08/20/2012 1341   VLDL 60* 08/20/2012 1341   LDLCALC 142* 08/20/2012 1341   Results for DARTAVIOUS, HORNOR (MRN [id number]) as of 09/11/2012 12:51  Ref. Range 07/21/2012 10:20 07/28/2012 15:22 08/04/2012 14:45 08/06/2012 15:10 08/11/2012 09:17  Tacrolimus Lvl Latest Range: 5.0-20.0 ng/mL 3.9 (L) 9.7 9.0 6.9 6.9   Magnesium was 2.1 on 09/29/2012.   STUDIES:  No recent studies.   ASSESSMENT: 66 y.o. 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  His treatment history is as follows:  (1) fludarabine/cyclophosphamide/rituximab x5 completed May 2007.  (2) rituximab for 8 doses October 2010, with partial response  (3) Leustatin and ofatumumab weekly x8 July to September 2011 followed by maintenance ofatumumab maintenance ofatumumab every 2 months, with initial response but rising counts September 2012  (4) status-post unrelated donor stem-cell transplant 02/24/2012 at [facility]  (a) conditioning regimen consisted of fludarabine + TBI at 200 cGy, followed by rituximab x27;  (b) CMV reactivation x2 (patient CMV positive, donor negative), s/p ganciclovir treatment; 3d reactivation August 2013, s/p gancyclovir, with negative PCR mid-September 2013 (c) GVHD: involving gut and skin, treated with steroids, tacrolimus and MMF  (d) atrial fibrillation: resolved on amiodarone  (e) steroid-induced myopathy: improving  (f) hypomagnesemia  (g) hypogammaglobulinemia: s/p IVIG 07/09/2012  (h) elevated triglycerides (606 on 07/14/2012)  (i) adrenal insufficiency (5) restaging studies September 2013 detailed above including CT scans, flow cytometry, and bone marrow biopsy, showed no evidence of residual chronic lymphoid leukemia.  PLAN: [place] will continue to receive supportive IV fluids with IV  magnesium on Mondays, Wednesdays, and Fridays, at least until we discontinue his ganciclovir. We are hopeful that he will only be only ganciclovir total of 3 weeks. He has completed 7 days with twice a day dosing, 7 days with once a day dosing and will continue on the current dose for at least another 7 days. Of course in the meanwhile, we will continue to check his CMV PCR every Wednesday. He'll continue to receive Neupogen injections on Mondays, Wednesdays, and Fridays as well, until his ANC is 7.5 or above.  Treasure will continue on the prednisone, but we will start a very slow taper. He will continue taking 10 mg in the morning and 2 1/2 mg in the afternoon for another week.  We will likely go to 7-1/2 and 2-1/2 mg after 2 weeks and then perhaps to 5 and 2-1/2 after another 2 weeks.   He is slowly tapering his dose of tacrolimus as suggested by [facility], his current dose being 1 mg in the morning and 1 point 5 in the evening. On October 16 we will go to 1.0 mg twice a day, on November 15 a half a milligram twice a day, and on December  15 half a milligram in the morning only. This assuming of course there is no exacerbation of his graft-versus-host disease.  Other ongoing issues include:  (1) chronic GVHD prophylaxis: On a lower dose of tacrolimus, and now low-dose prednisone as well  (2) CMV reactivation: resumed ganciclovir as noted above ; we will continue to monitor her CMV weekly  (3) hypogammaglobulinemia: received IgG at 663 mg/kg 08/18/2012; being scheduled for another IVIG infusion later this week on 10/01/2012  (4)   Hypomagnesemia:  Continuing with IV replacement 3 times weekly; we will try to go to twice a week and then once a week after stopping ganciclovir  (5) immunocompromise: continue prophylaxis with zovirax, diflucan, levofloxacin and Bactrim;  Receives Neupogen, 480 mcg 3 times a week,  continuing until the ANC is 7.5, as noted above.  Levofloxacin has been refilled today.  (6)   CGVHD assessment weekly initially, then at broadening intervals,    As always, they know to call with any changes or problems.   Zollie Scale    09/29/2012

## 2012-10-01 ENCOUNTER — Ambulatory Visit: Payer: Self-pay

## 2012-10-01 ENCOUNTER — Ambulatory Visit (HOSPITAL_BASED_OUTPATIENT_CLINIC_OR_DEPARTMENT_OTHER): Payer: BC Managed Care – PPO

## 2012-10-01 ENCOUNTER — Other Ambulatory Visit (HOSPITAL_BASED_OUTPATIENT_CLINIC_OR_DEPARTMENT_OTHER): Payer: BC Managed Care – PPO | Admitting: Lab

## 2012-10-01 VITALS — BP 157/84 | HR 71 | Temp 98.0°F | Resp 20 | Wt 192.0 lb

## 2012-10-01 DIAGNOSIS — C9110 Chronic lymphocytic leukemia of B-cell type not having achieved remission: Secondary | ICD-10-CM

## 2012-10-01 DIAGNOSIS — Z9489 Other transplanted organ and tissue status: Secondary | ICD-10-CM

## 2012-10-01 DIAGNOSIS — Z5189 Encounter for other specified aftercare: Secondary | ICD-10-CM

## 2012-10-01 DIAGNOSIS — D89811 Chronic graft-versus-host disease: Secondary | ICD-10-CM

## 2012-10-01 DIAGNOSIS — D709 Neutropenia, unspecified: Secondary | ICD-10-CM

## 2012-10-01 DIAGNOSIS — E860 Dehydration: Secondary | ICD-10-CM

## 2012-10-01 DIAGNOSIS — C9590 Leukemia, unspecified not having achieved remission: Secondary | ICD-10-CM

## 2012-10-01 DIAGNOSIS — D849 Immunodeficiency, unspecified: Secondary | ICD-10-CM

## 2012-10-01 LAB — CBC WITH DIFFERENTIAL/PLATELET
BASO%: 0.8 % (ref 0.0–2.0)
Basophils Absolute: 0 10*3/uL (ref 0.0–0.1)
EOS%: 1.6 % (ref 0.0–7.0)
HGB: 10 g/dL — ABNORMAL LOW (ref 13.0–17.1)
MCH: 31.3 pg (ref 27.2–33.4)
MCHC: 32.9 g/dL (ref 32.0–36.0)
MCV: 95.3 fL (ref 79.3–98.0)
MONO%: 11.3 % (ref 0.0–14.0)
RDW: 16.2 % — ABNORMAL HIGH (ref 11.0–14.6)
lymph#: 0.8 10*3/uL — ABNORMAL LOW (ref 0.9–3.3)

## 2012-10-01 MED ORDER — SODIUM CHLORIDE 0.9 % IV SOLN
INTRAVENOUS | Status: DC
  Administered 2012-10-01: 17:00:00 via INTRAVENOUS
  Filled 2012-10-01: qty 1000

## 2012-10-01 MED ORDER — IMMUNE GLOBULIN (HUMAN) 5 GM/100ML IV SOLN
450.0000 mg/kg | Freq: Once | INTRAVENOUS | Status: AC
  Administered 2012-10-01: 40 g via INTRAVENOUS
  Filled 2012-10-01: qty 800

## 2012-10-01 MED ORDER — FILGRASTIM 480 MCG/0.8ML IJ SOLN
480.0000 ug | Freq: Once | INTRAMUSCULAR | Status: AC
  Administered 2012-10-01: 480 ug via SUBCUTANEOUS
  Filled 2012-10-01: qty 0.8

## 2012-10-01 MED ORDER — SODIUM CHLORIDE 0.9 % IV SOLN
INTRAVENOUS | Status: AC
  Filled 2012-10-01: qty 1000

## 2012-10-01 NOTE — Progress Notes (Signed)
1303 Octagam initiated at 1303 rate 0.6/ml/kg/hr = 52 ml/hr x 26 cc. Patient tolerated well. No complaints.  1333 Rate increased to 1.2 ml/kg/hr= 104 ml/hr x 52 cc. Patient tolerated well. No complaints.  1403 Rate increased to 2.4 ml/kg/hr = 209 ml/hr x 105 cc. Patient tolerated well. No complaints.  1433 Rate increased to 4.2 ml/kg/hr = 290 ml/hr x 145 cc. Patient tolerated well. No complaints.

## 2012-10-01 NOTE — Patient Instructions (Addendum)

## 2012-10-04 ENCOUNTER — Other Ambulatory Visit: Payer: Self-pay | Admitting: Oncology

## 2012-10-04 ENCOUNTER — Other Ambulatory Visit (HOSPITAL_BASED_OUTPATIENT_CLINIC_OR_DEPARTMENT_OTHER): Payer: BC Managed Care – PPO

## 2012-10-04 ENCOUNTER — Other Ambulatory Visit: Payer: Self-pay | Admitting: Physician Assistant

## 2012-10-04 ENCOUNTER — Ambulatory Visit (HOSPITAL_BASED_OUTPATIENT_CLINIC_OR_DEPARTMENT_OTHER): Payer: BC Managed Care – PPO

## 2012-10-04 VITALS — BP 138/77 | HR 78 | Temp 97.7°F | Resp 20

## 2012-10-04 DIAGNOSIS — D89811 Chronic graft-versus-host disease: Secondary | ICD-10-CM

## 2012-10-04 DIAGNOSIS — C9110 Chronic lymphocytic leukemia of B-cell type not having achieved remission: Secondary | ICD-10-CM

## 2012-10-04 DIAGNOSIS — R894 Abnormal immunological findings in specimens from other organs, systems and tissues: Secondary | ICD-10-CM

## 2012-10-04 DIAGNOSIS — Z9489 Other transplanted organ and tissue status: Secondary | ICD-10-CM

## 2012-10-04 DIAGNOSIS — D709 Neutropenia, unspecified: Secondary | ICD-10-CM

## 2012-10-04 DIAGNOSIS — E860 Dehydration: Secondary | ICD-10-CM

## 2012-10-04 DIAGNOSIS — R768 Other specified abnormal immunological findings in serum: Secondary | ICD-10-CM

## 2012-10-04 DIAGNOSIS — D849 Immunodeficiency, unspecified: Secondary | ICD-10-CM

## 2012-10-04 LAB — CBC WITH DIFFERENTIAL/PLATELET
BASO%: 1.7 % (ref 0.0–2.0)
EOS%: 1.7 % (ref 0.0–7.0)
HCT: 34.2 % — ABNORMAL LOW (ref 38.4–49.9)
LYMPH%: 43.1 % (ref 14.0–49.0)
MCH: 32.4 pg (ref 27.2–33.4)
MCHC: 32.9 g/dL (ref 32.0–36.0)
MCV: 98.4 fL — ABNORMAL HIGH (ref 79.3–98.0)
NEUT%: 35.5 % — ABNORMAL LOW (ref 39.0–75.0)
Platelets: 104 10*3/uL — ABNORMAL LOW (ref 140–400)

## 2012-10-04 MED ORDER — SODIUM CHLORIDE 0.9 % IV SOLN
INTRAVENOUS | Status: AC
  Administered 2012-10-04: 16:00:00 via INTRAVENOUS
  Filled 2012-10-04: qty 1000

## 2012-10-04 MED ORDER — FILGRASTIM 480 MCG/0.8ML IJ SOLN
480.0000 ug | Freq: Once | INTRAMUSCULAR | Status: AC
  Administered 2012-10-04: 480 ug via SUBCUTANEOUS
  Filled 2012-10-04: qty 0.8

## 2012-10-04 NOTE — Progress Notes (Signed)
Discharged to home with wife at about 72.

## 2012-10-04 NOTE — Patient Instructions (Signed)
[facility] Discharge Instructions for Patients Receiving Chemotherapy  Today you received the following chemotherapy agents no chemotherapy, IVF and neulasta.  To help prevent nausea and vomiting after your treatment, we encourage you to take your nausea medication compazine Begin taking it at anytime upon discharge and take it as often as prescribed for the next 72 hours.   If you develop nausea and vomiting that is not controlled by your nausea medication, call the clinic. If it is after clinic hours your family physician or the after hours number for the clinic or go to the Emergency Department.   BELOW ARE SYMPTOMS THAT SHOULD BE REPORTED IMMEDIATELY:  *FEVER GREATER THAN 100.5 F  *CHILLS WITH OR WITHOUT FEVER  NAUSEA AND VOMITING THAT IS NOT CONTROLLED WITH YOUR NAUSEA MEDICATION  *UNUSUAL SHORTNESS OF BREATH  *UNUSUAL BRUISING OR BLEEDING  TENDERNESS IN MOUTH AND THROAT WITH OR WITHOUT PRESENCE OF ULCERS  *URINARY PROBLEMS  *BOWEL PROBLEMS  UNUSUAL RASH Items with * indicate a potential emergency and should be followed up as soon as possible.  Please let the nurse know about any problems that you may have experienced. Feel free to call the clinic you have any questions or concerns. The clinic phone number is [phone].   I have been informed and understand all the instructions given to me. I know to contact the clinic, my physician, or go to the Emergency Department if any problems should occur. I do not have any questions at this time, but understand that I may call the clinic during office hours   should I have any questions or need assistance in obtaining follow up care.    __________________________________________  _____________  __________ Signature of Patient or Authorized Representative            Date                   Time    __________________________________________ Nurse's Signature

## 2012-10-04 NOTE — Progress Notes (Signed)
Patient here and accessed at 3:30 pm.  Awaiting fluids with magnesium as ordered by provider. Upon IV disconnect. Patient asked for labs.  AVS and lab reports printed and given to pt.  CMET and lactate dehydronase results pending.

## 2012-10-06 ENCOUNTER — Ambulatory Visit (HOSPITAL_BASED_OUTPATIENT_CLINIC_OR_DEPARTMENT_OTHER): Payer: BC Managed Care – PPO

## 2012-10-06 ENCOUNTER — Telehealth: Payer: Self-pay | Admitting: Oncology

## 2012-10-06 ENCOUNTER — Ambulatory Visit (HOSPITAL_BASED_OUTPATIENT_CLINIC_OR_DEPARTMENT_OTHER): Payer: BC Managed Care – PPO | Admitting: Physician Assistant

## 2012-10-06 ENCOUNTER — Encounter: Payer: Self-pay | Admitting: Physician Assistant

## 2012-10-06 ENCOUNTER — Telehealth: Payer: Self-pay | Admitting: *Deleted

## 2012-10-06 ENCOUNTER — Other Ambulatory Visit (HOSPITAL_BASED_OUTPATIENT_CLINIC_OR_DEPARTMENT_OTHER): Payer: BC Managed Care – PPO

## 2012-10-06 VITALS — BP 104/70 | HR 78 | Temp 98.6°F | Resp 20 | Ht 68.0 in | Wt 192.8 lb

## 2012-10-06 DIAGNOSIS — Z9489 Other transplanted organ and tissue status: Secondary | ICD-10-CM

## 2012-10-06 DIAGNOSIS — C9110 Chronic lymphocytic leukemia of B-cell type not having achieved remission: Secondary | ICD-10-CM

## 2012-10-06 DIAGNOSIS — R5381 Other malaise: Secondary | ICD-10-CM

## 2012-10-06 DIAGNOSIS — R7689 Other specified abnormal immunological findings in serum: Secondary | ICD-10-CM

## 2012-10-06 DIAGNOSIS — D61818 Other pancytopenia: Secondary | ICD-10-CM

## 2012-10-06 DIAGNOSIS — R894 Abnormal immunological findings in specimens from other organs, systems and tissues: Secondary | ICD-10-CM

## 2012-10-06 DIAGNOSIS — Z23 Encounter for immunization: Secondary | ICD-10-CM

## 2012-10-06 DIAGNOSIS — R768 Other specified abnormal immunological findings in serum: Secondary | ICD-10-CM

## 2012-10-06 DIAGNOSIS — D709 Neutropenia, unspecified: Secondary | ICD-10-CM

## 2012-10-06 DIAGNOSIS — D89811 Chronic graft-versus-host disease: Secondary | ICD-10-CM

## 2012-10-06 DIAGNOSIS — D849 Immunodeficiency, unspecified: Secondary | ICD-10-CM

## 2012-10-06 DIAGNOSIS — D801 Nonfamilial hypogammaglobulinemia: Secondary | ICD-10-CM

## 2012-10-06 DIAGNOSIS — C9421 Acute megakaryoblastic leukemia, in remission: Secondary | ICD-10-CM

## 2012-10-06 LAB — CBC WITH DIFFERENTIAL/PLATELET
Basophils Absolute: 0.1 10*3/uL (ref 0.0–0.1)
EOS%: 0.9 % (ref 0.0–7.0)
HGB: 11 g/dL — ABNORMAL LOW (ref 13.0–17.1)
MCH: 33 pg (ref 27.2–33.4)
MCV: 97.8 fL (ref 79.3–98.0)
MONO%: 10.4 % (ref 0.0–14.0)
RBC: 3.32 10*6/uL — ABNORMAL LOW (ref 4.20–5.82)
RDW: 16.8 % — ABNORMAL HIGH (ref 11.0–14.6)

## 2012-10-06 LAB — LACTATE DEHYDROGENASE (CC13): LDH: 284 U/L — ABNORMAL HIGH (ref 125–220)

## 2012-10-06 LAB — MAGNESIUM (CC13): Magnesium: 2 mg/dl (ref 1.5–2.5)

## 2012-10-06 LAB — COMPREHENSIVE METABOLIC PANEL (CC13)
ALT: 10 U/L (ref 0–55)
AST: 25 U/L (ref 5–34)
BUN: 15 mg/dL (ref 7.0–26.0)
Creatinine: 1 mg/dL (ref 0.7–1.3)
Total Bilirubin: 0.3 mg/dL (ref 0.20–1.20)

## 2012-10-06 MED ORDER — FILGRASTIM 480 MCG/0.8ML IJ SOLN
480.0000 ug | Freq: Once | INTRAMUSCULAR | Status: AC
  Administered 2012-10-06: 480 ug via SUBCUTANEOUS
  Filled 2012-10-06: qty 0.8

## 2012-10-06 MED ORDER — INFLUENZA VIRUS VACC SPLIT PF IM SUSP
0.5000 mL | Freq: Once | INTRAMUSCULAR | Status: AC
Start: 2012-10-06 — End: 2012-10-06
  Administered 2012-10-06: 0.5 mL via INTRAMUSCULAR
  Filled 2012-10-06: qty 0.5

## 2012-10-06 MED ORDER — SODIUM CHLORIDE 0.9 % IV SOLN
INTRAVENOUS | Status: AC
  Administered 2012-10-06: 16:00:00 via INTRAVENOUS
  Filled 2012-10-06: qty 1000

## 2012-10-06 NOTE — Telephone Encounter (Signed)
Per staff message and POF I have scheduled appts.  JMW  

## 2012-10-06 NOTE — Progress Notes (Signed)
ID: Timothy [name]   DOB: March 29, 1946  MR#: [id number]  QMV#:[id number]  HISTORY OF PRESENT ILLNESS: We have very complete records from Dr. Sydnee Levans in [place], and in summary:  The patient was initially diagnosed in August 2000, with a white cell count of 23,600, but normal hemoglobin and platelets, and no significant symptomatology. Over the next several years his white cell count drifted up, and he eventually developed some symptoms of night sweats in particular, leading to treatment with fludarabine, Cytoxan and rituxan for five cycles given between December 2006 and May 2007.  We have CT scans from June 2006, November 2006 and April 2007, and comparing the November 2006 and April 2007 scans, there was near complete response. He had subsequent therapy in [place] as detailed below, but with decreased response, leading to allogeneic stem-cell transplant at the [facility] 02/24/2012.  INTERVAL HISTORY: [name] returns with his wife [name]  for followup, status post allogenic stem cell transplant for CLL in March 2013.   Interval history is remarkable for [name] having completed a total of 21 days on ganciclovir, with 3 recent negative CMV readings, most recently on 09/29/2012. (Today's labs are still pending.) His nausea has resolved.   He is exercising on a regular basis, walking approximately 30 minutes daily. Overall, he is feeling much better. His energy level has improved. Rusell also continues on prednisone, currently taking 10 mg in the morning and 2.5 in the afternoon.     REVIEW OF SYSTEMS: He remains weak, but less so as noted above. He's had no fevers, drenching night sweats, or chills.  He is not having any mouth sores or sore throat at present. He bruises easily, but denies any abnormal bleeding.  He denies unusual headaches, worsening dizziness, worsening tremor, cough, phlegm production, pleurisy, or  shortness of breath.   He has some chronic back pain for which  he utilizes oxycodone appropriately and effectively. He tends to be slightly constipated, but manages to have very small bowel movements at least every other day.   A detailed review of systems was otherwise stable.  PAST MEDICAL HISTORY: Past Medical History  Diagnosis Date  . 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 Diverticular disease   . Hyperlipidemia   . Obesity   . Hypertension   . Hiatal hernia   . CMV (cytomegalovirus) antibody positive     pre-transplant, with seroconversion x2 pst-transplant  . Right bundle branch block     pre-transplant  . CKD (chronic kidney disease) stage 2, GFR 60-89 ml/min   . Pancytopenia   . Steroid-induced diabetes   . Atrial fibrillation     post-transplant  . Myopathy   . 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 Leukemia, chronic lymphoid   . 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 Tonsillectomy and adenoidectomy   . Bone marrow transplant     FAMILY HISTORY Family History  Problem Relation Age of Onset  . Cancer Father    The patient's father died from complications of chronic lymphocytic leukemia at the age of 30.  It had been diagnosed seven years before when he was 58.  The patient is enrolled in a familial chronic lymphocytic leukemia study out of the [employer] [facility] [employer].  The patient's mother is 15, alive, unfortunately suffering with dementia, and he has a brother, 59, who is otherwise in fair health.  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  His wife of >40 years, [name], is a homemaker.  Their daughter, [name], lives in [place].  She also is a Futures trader.  The patient has an 17 year old grandson and an 63-year-old granddaughter, and that is really the main reason he moved to this area.   He is a International aid/development worker.     ADVANCED DIRECTIVES:  HEALTH MAINTENANCE: History  Substance Use Topics  . Smoking status: Never Smoker   . Smokeless tobacco: Never Used  . Alcohol Use: No     Colonoscopy:  PAP:  Bone density:  Lipid panel:  Allergies  Allergen Reactions  . Benadryl (Diphenhydramine Hcl)     "Restless leg syndrome"    Current Outpatient Prescriptions  Medication Sig Dispense Refill  . Alum & Mag Hydroxide-Simeth (MAGIC MOUTHWASH W/LIDOCAINE) SOLN Take 5 mLs by mouth 4 (four) times daily as needed. Mouth sores      . antiseptic oral rinse (BIOTENE) LIQD 15 mLs by Mouth Rinse route 2 times daily at 12 noon and 4 pm.  237 mL  6  . Calcium Citrate (CITRACAL PO) Take 1,200 mg by mouth 2 (two) times daily.      . chlorhexidine (PERIDEX) 0.12 % solution       . cholecalciferol (VITAMIN D) 1000 UNITS tablet Take 1,000 Units by mouth daily.      Marland Kitchen dexamethasone 0.5 MG/5ML elixir Take 1 mg by mouth daily. Rinse mouth 4-6 times daily and spit out      . dextrose 5 % SOLN 50 mL with magnesium sulfate 50 % SOLN 1 g Inject 1 g into the vein 3 (three) times a week. Gets this at [name]      . filgrastim (NEUPOGEN) 480 MCG/1.6ML injection Inject 480 mcg into the skin 3 (three) times a week.       . heparin lock flush 100 UNIT/ML SOLN Use as directed for hickman catheter care  60 Syringe  3  . labetalol (NORMODYNE) 200 MG tablet Take 200 mg by mouth 2 (two) times daily.      Marland Kitchen levofloxacin (LEVAQUIN) 750 MG tablet Take 1 tablet (750 mg total) by mouth daily.  30 tablet  1  . lisinopril (PRINIVIL,ZESTRIL) 10 MG tablet Take 10 mg by mouth at bedtime.      Marland Kitchen LORazepam (ATIVAN) 0.5 MG tablet Take 0.5 mg by mouth every 6 (six) hours as needed. anxiety      . Multiple Vitamins-Minerals (CENTRUM SILVER ADULT 50+) TABS Take 1 tablet by mouth daily.      . Omega-3 Fatty Acids (FISH OIL) 300 MG CAPS Take 600 mg by mouth daily.      Marland Kitchen omeprazole (PRILOSEC) 20 MG capsule Take 20 mg by mouth daily.       . ondansetron (ZOFRAN-ODT) 8 MG disintegrating tablet Take 1 tablet (8 mg total) by mouth 2 (two) times daily.  30 tablet  1  . oxycodone (OXY-IR) 5 MG capsule Take 1 capsule (5 mg total) by mouth every 6 (six) hours as needed. Pain  30 capsule  0  . predniSONE (DELTASONE) 5 MG tablet Take 1 tablet (5 mg total) by mouth 2 (two) times daily. Take 2 tablets (10 mg) with breakfast, one tablet (5 mg) with supper  90 tablet  3  . prochlorperazine (COMPAZINE) 10 MG tablet Take 10 mg by mouth every 6 (  six) hours as needed. Nausea      . sertraline (ZOLOFT) 50 MG tablet Take 50-100 mg by mouth daily. Takes 50 mg one day and 100 mg the next day      . Sodium Chloride Flush 0.9 % SOLN Use as directed for hickman catheter flushes  60 Syringe  3  . sulfamethoxazole-trimethoprim (BACTRIM DS) 800-160 MG per tablet Take 1 tablet by mouth. Takes twice per week      . tacrolimus (PROGRAF) 1 MG capsule Take 1.5 mg by mouth at bedtime. Takes with a 0.5 mg capsule      . tacrolimus (PROGRAF) 0.5 MG capsule Take 1 mg by mouth every morning. Takes with 1 mg capsule      . valGANciclovir (VALCYTE) 450 MG tablet Take 900 mg by mouth daily.       Current Facility-Administered Medications  Medication Dose Route Frequency Provider Last Rate Last Dose  . influenza  inactive virus vaccine (FLUZONE/FLUARIX) injection 0.5 mL  0.5 mL Intramuscular Once Catalina Gravel, PA   0.5 mL at 10/06/12 1529   Facility-Administered Medications Ordered in Other Visits  Medication Dose Route Frequency Provider Last Rate Last Dose  . filgrastim (NEUPOGEN) injection 480 mcg  480 mcg Subcutaneous Once Tahjai Schetter G Kerston Landeck, PA      . sodium chloride 0.9 % 1,000 mL with magnesium sulfate 1 g infusion   Intravenous Continuous Catalina Gravel, PA 500 mL/hr at 10/06/12 1537    . sodium chloride 0.9 % injection 10 mL  10 mL Intravenous PRN Lowella Dell, MD   10 mL at 08/11/12 1606    OBJECTIVE: Middle-aged white male who appears tired but in no acute  distress  Filed Vitals:   10/06/12 1458  BP: 104/70  Pulse: 78  Temp: 98.6 F (37 C)  Resp: 20     Body mass index is 29.32 kg/(m^2).    Filed Weights   10/06/12 1458  Weight: 192 lb 12.8 oz (87.454 kg)   ECOG FS: 1  Sclerae unicteric  Oropharynx clear with no lesions or thrush No cervical or supraclavicular adenopathy  Lungs clear to auscultation,  no rales or rhonchi  Heart regular rate and rhythm, no murmur appreciated Abd benign, specifically +BS, soft, nontender to palpation  MSK  mild nonpitting edema bilaterally in the lower extremities, equal bilaterally. No upper extremity edema noted Neuro: nonfocal, alert and oriented x 3,  Skin:  No evident rash    LAB RESULTS:  CMV Negative  09/29/2012  Negative  09/24/2012  Negative   09/15/2012  Positive, <200  09/08/2012   Lab Results  Component Value Date   WBC 4.8 10/06/2012   NEUTROABS 3.2 10/06/2012   HGB 11.0* 10/06/2012   HCT 32.4* 10/06/2012   MCV 97.8 10/06/2012   PLT 119* 10/06/2012      Chemistry      Component Value Date/Time   NA 137 10/06/2012 1408   NA 136 08/26/2012 0540   K 3.9 10/06/2012 1408   K 3.7 08/26/2012 0540   CL 107 10/06/2012 1408   CL 107 08/26/2012 0540   CO2 24 10/06/2012 1408   CO2 21 08/26/2012 0540   BUN 15.0 10/06/2012 1408   BUN 8 08/26/2012 0540   CREATININE 1.0 10/06/2012 1408   CREATININE 1.03 08/26/2012 0540      Component Value Date/Time   CALCIUM 8.9 10/06/2012 1408   CALCIUM 8.2* 08/26/2012 0540   ALKPHOS 63 10/06/2012 1408   ALKPHOS 84 08/26/2012  0540   AST 25 10/06/2012 1408   AST 18 08/26/2012 0540   ALT 10 10/06/2012 1408   ALT 7 08/26/2012 0540   BILITOT 0.30 10/06/2012 1408   BILITOT 0.2* 08/26/2012 0540     IgG:  409 09/22/2012 IgA: <6 09/22/2012  IgM: <5 09/22/2012  LDH: 281 09/29/2012   Lipid Panel     Component Value Date/Time   CHOL 225* 08/20/2012 1341   TRIG 301* 08/20/2012 1341   HDL 23* 08/20/2012 1341   CHOLHDL 9.8 08/20/2012 1341   VLDL 60*  08/20/2012 1341   LDLCALC 142* 08/20/2012 1341   Results for MARCELLINO, FIDALGO (MRN [id number]) as of 09/11/2012 12:51  Ref. Range 07/21/2012 10:20 07/28/2012 15:22 08/04/2012 14:45 08/06/2012 15:10 08/11/2012 09:17  Tacrolimus Lvl Latest Range: 5.0-20.0 ng/mL 3.9 (L) 9.7 9.0 6.9 6.9   Magnesium was 2.2 on 10/04/2012.   STUDIES:  No recent studies.   ASSESSMENT: 66 y.o. [place] man with a history of well-differentiated lymphocytic lymphoma/ chronic lymphoid leukemia initially diagnosed in 2000, not requiring intervention until 2006; with multiple chromosomal abnormalities.  His treatment history is as follows:  (1) fludarabine/cyclophosphamide/rituximab x5 completed May 2007.  (2) rituximab for 8 doses October 2010, with partial response  (3) Leustatin and ofatumumab weekly x8 July to September 2011 followed by maintenance ofatumumab maintenance ofatumumab every 2 months, with initial response but rising counts September 2012  (4) status-post unrelated donor stem[facility]cell transplant [facility]/24/2012 [facility]  (a) conditioning regimen consisted of fludarabine + TBI at 200 cGy, followed by rituximab x27;  (b) CMV reactivation x2 (patient CMV positive, donor negative), s/p ganciclovir treatment; 3d reactivation August 2013, s/p gancyclovir, with negative PCR mid-September 2013 (c) GVHD: involving gut and skin, treated with steroids, tacrolimus and MMF  (d) atrial fibrillation: resolved on amiodarone  (e) steroid-induced myopathy: improving  (f) hypomagnesemia  (g) hypogammaglobulinemia: s/p IVIG 07/09/2012  (h) elevated triglycerides (606 on 07/14/2012)  (i) adrenal insufficiency (5) restaging studies September 2013 detailed above including CT scans, flow cytometry, and bone marrow biopsy, showed no evidence of residual chronic lymphoid leukemia.  PLAN: Yechiel looks great today. We now have 3 negative  CMV readings, and as of today, he complete a total of 21 days on ganciclovir. Today will be his  last dose according to protocol.  He will continue to receive supportive IV fluids with IV magnesium twice weekly for the next couple of weeks. We will then reassess, and hopefully be able to discontinue the IV fluids/magnesium since he has completed his ganciclovir.  Of course in the meanwhile, we will continue to check his CMV PCR every Wednesday. He'll continue to receive Neupogen injections at each appointment here until his ANC is 7.5 or above.  [place] will continue on the prednisone, but we will start a very slow taper. He will continue taking 10 mg in the morning and 2 1/2 mg in the afternoon for another week.  We will likely go to 7-1/2 and 2-1/2 mg as of 10/11/2012 and then perhaps to 5 and 2-1/2 after another 2 weeks.   He is slowly tapering his dose of tacrolimus as suggested by [facility], and as of today, will go to 1.0 mg twice a day; on November 15 a half a milligram twice a day; and on December 15 half a milligram in the morning only. This assuming of course there is no exacerbation of his graft-versus-host disease.  Other ongoing issues include:  (1) chronic GVHD prophylaxis: On a lower dose of tacrolimus,  and now low-dose prednisone as well  (2) CMV reactivation: Completed 3 weeks of ganciclovir per protocol, now with 3 negative CMV readings, most recently on 09/29/2012.   (3) hypogammaglobulinemia: received IgG at 663 mg/kg 08/18/2012; status post IVIG infusion most recently on 10/01/2012  (4)   Hypomagnesemia:  Continuing with IV replacement 3 times weekly; we will try to go to twice a week and then once a week since stopping ganciclovir  (5) immunocompromise: continue prophylaxis with zovirax, diflucan, levofloxacin and Bactrim;  Receives Neupogen, 480 mcg 3 times a week,  continuing until the ANC is 7.5, as noted above.  Levofloxacin has been refilled today.  (6)  CGVHD assessment weekly initially, then at broadening intervals,    As always, they know to call with any changes  or problems.   Zollie Scale    10/06/2012

## 2012-10-06 NOTE — Telephone Encounter (Signed)
Pt's wife to pick up a schedule today before they leave the bldg for oct.

## 2012-10-07 ENCOUNTER — Telehealth: Payer: Self-pay | Admitting: *Deleted

## 2012-10-07 LAB — IGG, IGA, IGM
IgA: <6 mg/dL — ABNORMAL LOW (ref 68–379)
IgM, Serum: <5 mg/dL — ABNORMAL LOW (ref 41–251)

## 2012-10-07 NOTE — Progress Notes (Signed)
Note for 09/20/12. Magnesiusm programmed and infused over 2 hrs.

## 2012-10-07 NOTE — Telephone Encounter (Signed)
Per staff message and POF I have scheduled appts. JWM  

## 2012-10-08 ENCOUNTER — Ambulatory Visit (HOSPITAL_BASED_OUTPATIENT_CLINIC_OR_DEPARTMENT_OTHER): Payer: BC Managed Care – PPO

## 2012-10-08 ENCOUNTER — Other Ambulatory Visit (HOSPITAL_BASED_OUTPATIENT_CLINIC_OR_DEPARTMENT_OTHER): Payer: BC Managed Care – PPO | Admitting: Lab

## 2012-10-08 VITALS — BP 134/73 | HR 73 | Temp 98.4°F

## 2012-10-08 DIAGNOSIS — C9110 Chronic lymphocytic leukemia of B-cell type not having achieved remission: Secondary | ICD-10-CM

## 2012-10-08 DIAGNOSIS — E291 Testicular hypofunction: Secondary | ICD-10-CM

## 2012-10-08 DIAGNOSIS — D709 Neutropenia, unspecified: Secondary | ICD-10-CM

## 2012-10-08 DIAGNOSIS — Z9489 Other transplanted organ and tissue status: Secondary | ICD-10-CM

## 2012-10-08 DIAGNOSIS — D89811 Chronic graft-versus-host disease: Secondary | ICD-10-CM

## 2012-10-08 DIAGNOSIS — D849 Immunodeficiency, unspecified: Secondary | ICD-10-CM

## 2012-10-08 DIAGNOSIS — E860 Dehydration: Secondary | ICD-10-CM

## 2012-10-08 LAB — COMPREHENSIVE METABOLIC PANEL (CC13)
AST: 28 U/L (ref 5–34)
Albumin: 3.2 g/dL — ABNORMAL LOW (ref 3.5–5.0)
BUN: 14 mg/dL (ref 7.0–26.0)
CO2: 23 mEq/L (ref 22–29)
Calcium: 8.8 mg/dL (ref 8.4–10.4)
Chloride: 106 mEq/L (ref 98–107)
Glucose: 115 mg/dl — ABNORMAL HIGH (ref 70–99)
Potassium: 4.4 mEq/L (ref 3.5–5.1)

## 2012-10-08 LAB — CBC WITH DIFFERENTIAL/PLATELET
BASO%: 0.3 % (ref 0.0–2.0)
EOS%: 0.6 % (ref 0.0–7.0)
HGB: 10.9 g/dL — ABNORMAL LOW (ref 13.0–17.1)
MCH: 31.4 pg (ref 27.2–33.4)
MCV: 96.3 fL (ref 79.3–98.0)
MONO%: 6.3 % (ref 0.0–14.0)
RBC: 3.47 10*6/uL — ABNORMAL LOW (ref 4.20–5.82)
RDW: 16.3 % — ABNORMAL HIGH (ref 11.0–14.6)
lymph#: 1.1 10*3/uL (ref 0.9–3.3)
nRBC: 0 % (ref 0–0)

## 2012-10-08 LAB — MAGNESIUM (CC13): Magnesium: 1.8 mg/dl (ref 1.5–2.5)

## 2012-10-08 MED ORDER — SODIUM CHLORIDE 0.9 % IV SOLN
INTRAVENOUS | Status: AC
  Administered 2012-10-08: 16:00:00 via INTRAVENOUS
  Filled 2012-10-08: qty 1000

## 2012-10-08 MED ORDER — FILGRASTIM 480 MCG/0.8ML IJ SOLN
480.0000 ug | Freq: Once | INTRAMUSCULAR | Status: AC
  Administered 2012-10-08: 480 ug via SUBCUTANEOUS
  Filled 2012-10-08: qty 0.8

## 2012-10-08 NOTE — Patient Instructions (Addendum)
Hypomagnesemia Magnesium is a common ion (mineral) in the body which is needed for metabolism. It is about how the body handles food and other chemical reactions necessary for life. Only about 2% of the magnesium in our body is found in the blood. When this is low, it is called hypomagnesemia. The blood will measure only a tiny amount of the magnesium in our body. When it is low in our blood, it does not mean that the whole body supply is low. The normal serum concentration ranges from 1.8-2.5 mEq/L. When the level gets to be less than 1.0 mEq/L, a number of problems begin to happen.  CAUSES   Receiving intravenous fluids without magnesium replacement.  Loss of magnesium from the bowel by naso-gastric suction.  Loss of magnesium from nausea and vomiting or severe diarrhea. Any of the inflammatory bowel conditions can cause this.  Abuse of alcohol often leads to low serum magnesium.  An inherited form of magnesium loss happens when the kidneys lose magnesium. This is called familial or primary hypomagnesemia.  Some medications such as diuretics also cause the loss of magnesium. SYMPTOMS  These following problems are worse if the changes in magnesium levels come on suddenly.  Tremor.  Confusion.  Muscle weakness.  Over-sensitive to sights and sounds.  Sensitive reflexes.  Depression.  Muscular fibrillations.  Over-reactivity of the nerves.  Irritability.  Psychosis.  Spasms of the hand muscles.  Tetany (where the muscles go into uncontrollable spasms). DIAGNOSIS  This condition can be diagnosed by blood tests. TREATMENT   In emergency, magnesium can be given intravenously (by vein).  If the condition is less worrisome, it can be corrected by diet. High levels of magnesium are found in green leafy vegetables, peas, beans and nuts among other things. It can also be given through medications by mouth.  If it is being caused by medications, changes can be made.  If  alcohol is a problem, help is available if there are difficulties giving it up. Document Released: 09/03/2005 Document Revised: 03/01/2012 Document Reviewed: 07/28/2008 Exit[employer] Patient Information 2013 [employer].  

## 2012-10-11 ENCOUNTER — Other Ambulatory Visit (HOSPITAL_BASED_OUTPATIENT_CLINIC_OR_DEPARTMENT_OTHER): Payer: BC Managed Care – PPO | Admitting: Lab

## 2012-10-11 ENCOUNTER — Ambulatory Visit (HOSPITAL_BASED_OUTPATIENT_CLINIC_OR_DEPARTMENT_OTHER): Payer: BC Managed Care – PPO

## 2012-10-11 VITALS — BP 141/81 | HR 87 | Temp 97.6°F | Resp 20

## 2012-10-11 DIAGNOSIS — D709 Neutropenia, unspecified: Secondary | ICD-10-CM

## 2012-10-11 DIAGNOSIS — D61818 Other pancytopenia: Secondary | ICD-10-CM

## 2012-10-11 DIAGNOSIS — D89811 Chronic graft-versus-host disease: Secondary | ICD-10-CM

## 2012-10-11 DIAGNOSIS — C9110 Chronic lymphocytic leukemia of B-cell type not having achieved remission: Secondary | ICD-10-CM

## 2012-10-11 DIAGNOSIS — D849 Immunodeficiency, unspecified: Secondary | ICD-10-CM

## 2012-10-11 DIAGNOSIS — R768 Other specified abnormal immunological findings in serum: Secondary | ICD-10-CM

## 2012-10-11 DIAGNOSIS — Z9489 Other transplanted organ and tissue status: Secondary | ICD-10-CM

## 2012-10-11 DIAGNOSIS — E860 Dehydration: Secondary | ICD-10-CM

## 2012-10-11 DIAGNOSIS — R894 Abnormal immunological findings in specimens from other organs, systems and tissues: Secondary | ICD-10-CM

## 2012-10-11 LAB — COMPREHENSIVE METABOLIC PANEL (CC13)
ALT: 13 U/L (ref 0–55)
AST: 28 U/L (ref 5–34)
CO2: 21 mEq/L — ABNORMAL LOW (ref 22–29)
Calcium: 8.5 mg/dL (ref 8.4–10.4)
Chloride: 106 mEq/L (ref 98–107)
Creatinine: 0.8 mg/dL (ref 0.7–1.3)
Potassium: 4.1 mEq/L (ref 3.5–5.1)
Sodium: 137 mEq/L (ref 136–145)
Total Protein: 5.1 g/dL — ABNORMAL LOW (ref 6.4–8.3)

## 2012-10-11 LAB — CBC WITH DIFFERENTIAL/PLATELET
Eosinophils Absolute: 0.1 10*3/uL (ref 0.0–0.5)
MCV: 96.7 fL (ref 79.3–98.0)
MONO%: 19.4 % — ABNORMAL HIGH (ref 0.0–14.0)
NEUT#: 2.1 10*3/uL (ref 1.5–6.5)
RBC: 3.6 10*6/uL — ABNORMAL LOW (ref 4.20–5.82)
RDW: 15.9 % — ABNORMAL HIGH (ref 11.0–14.6)
WBC: 3.8 10*3/uL — ABNORMAL LOW (ref 4.0–10.3)
nRBC: 0 % (ref 0–0)

## 2012-10-11 MED ORDER — SODIUM CHLORIDE 0.9 % IV SOLN
INTRAVENOUS | Status: DC
  Administered 2012-10-11: 14:00:00 via INTRAVENOUS

## 2012-10-11 MED ORDER — SODIUM CHLORIDE 0.9 % IV SOLN
INTRAVENOUS | Status: AC
  Administered 2012-10-11: 14:00:00 via INTRAVENOUS
  Filled 2012-10-11: qty 1000

## 2012-10-11 MED ORDER — FILGRASTIM 480 MCG/0.8ML IJ SOLN
480.0000 ug | Freq: Once | INTRAMUSCULAR | Status: AC
  Administered 2012-10-11: 480 ug via SUBCUTANEOUS
  Filled 2012-10-11: qty 0.8

## 2012-10-11 NOTE — Patient Instructions (Signed)
Patient aware of next appointment; discharged home with no complaints. 

## 2012-10-13 ENCOUNTER — Ambulatory Visit: Payer: Self-pay | Admitting: Physician Assistant

## 2012-10-13 ENCOUNTER — Other Ambulatory Visit: Payer: Self-pay | Admitting: Lab

## 2012-10-14 ENCOUNTER — Other Ambulatory Visit (HOSPITAL_BASED_OUTPATIENT_CLINIC_OR_DEPARTMENT_OTHER): Payer: BC Managed Care – PPO | Admitting: Lab

## 2012-10-14 ENCOUNTER — Ambulatory Visit (HOSPITAL_BASED_OUTPATIENT_CLINIC_OR_DEPARTMENT_OTHER): Payer: BC Managed Care – PPO

## 2012-10-14 ENCOUNTER — Ambulatory Visit (HOSPITAL_BASED_OUTPATIENT_CLINIC_OR_DEPARTMENT_OTHER): Payer: BC Managed Care – PPO | Admitting: Oncology

## 2012-10-14 ENCOUNTER — Telehealth: Payer: Self-pay | Admitting: *Deleted

## 2012-10-14 VITALS — BP 147/82 | HR 83 | Temp 98.4°F | Resp 20 | Ht 68.0 in | Wt 189.1 lb

## 2012-10-14 DIAGNOSIS — Z9489 Other transplanted organ and tissue status: Secondary | ICD-10-CM

## 2012-10-14 DIAGNOSIS — D849 Immunodeficiency, unspecified: Secondary | ICD-10-CM

## 2012-10-14 DIAGNOSIS — D61818 Other pancytopenia: Secondary | ICD-10-CM

## 2012-10-14 DIAGNOSIS — R894 Abnormal immunological findings in specimens from other organs, systems and tissues: Secondary | ICD-10-CM

## 2012-10-14 DIAGNOSIS — D89811 Chronic graft-versus-host disease: Secondary | ICD-10-CM

## 2012-10-14 DIAGNOSIS — C8599 Non-Hodgkin lymphoma, unspecified, extranodal and solid organ sites: Secondary | ICD-10-CM

## 2012-10-14 DIAGNOSIS — C9110 Chronic lymphocytic leukemia of B-cell type not having achieved remission: Secondary | ICD-10-CM

## 2012-10-14 DIAGNOSIS — R7689 Other specified abnormal immunological findings in serum: Secondary | ICD-10-CM

## 2012-10-14 DIAGNOSIS — D801 Nonfamilial hypogammaglobulinemia: Secondary | ICD-10-CM

## 2012-10-14 DIAGNOSIS — R768 Other specified abnormal immunological findings in serum: Secondary | ICD-10-CM

## 2012-10-14 LAB — CBC WITH DIFFERENTIAL/PLATELET
BASO%: 0.9 % (ref 0.0–2.0)
Eosinophils Absolute: 0.1 10*3/uL (ref 0.0–0.5)
LYMPH%: 13.9 % — ABNORMAL LOW (ref 14.0–49.0)
MCHC: 33.9 g/dL (ref 32.0–36.0)
MONO#: 0.6 10*3/uL (ref 0.1–0.9)
NEUT#: 4.7 10*3/uL (ref 1.5–6.5)
RBC: 3.62 10*6/uL — ABNORMAL LOW (ref 4.20–5.82)
RDW: 16.4 % — ABNORMAL HIGH (ref 11.0–14.6)
WBC: 6.3 10*3/uL (ref 4.0–10.3)
lymph#: 0.9 10*3/uL (ref 0.9–3.3)

## 2012-10-14 LAB — COMPREHENSIVE METABOLIC PANEL (CC13)
ALT: 12 U/L (ref 0–55)
AST: 29 U/L (ref 5–34)
CO2: 25 mEq/L (ref 22–29)
Calcium: 9.3 mg/dL (ref 8.4–10.4)
Chloride: 103 mEq/L (ref 98–107)
Creatinine: 0.8 mg/dL (ref 0.7–1.3)
Potassium: 3.9 mEq/L (ref 3.5–5.1)
Sodium: 137 mEq/L (ref 136–145)
Total Protein: 5.8 g/dL — ABNORMAL LOW (ref 6.4–8.3)

## 2012-10-14 LAB — MAGNESIUM (CC13): Magnesium: 2 mg/dl (ref 1.5–2.5)

## 2012-10-14 MED ORDER — SODIUM CHLORIDE 0.9 % IV SOLN
Freq: Once | INTRAVENOUS | Status: AC
  Administered 2012-10-14: 17:00:00 via INTRAVENOUS
  Filled 2012-10-14: qty 500

## 2012-10-14 NOTE — Telephone Encounter (Signed)
Cancelled 10-18-2012 and 10-20-2012  Gave patient appointment for 10-21-2012 starting at 10:15am  Sent michelle email to set up treatment for 10-21-2012

## 2012-10-14 NOTE — Progress Notes (Signed)
ID: Timothy [name]   DOB: 04-24-1946  MR#: [id number]  WUJ#:[id number]  HISTORY OF PRESENT ILLNESS: We have very complete records from Dr. Sydnee Levans in [place], and in summary:  The patient was initially diagnosed in August 2000, with a white cell count of 23,600, but normal hemoglobin and platelets, and no significant symptomatology. Over the next several years his white cell count drifted up, and he eventually developed some symptoms of night sweats in particular, leading to treatment with fludarabine, Cytoxan and rituxan for five cycles given between December 2006 and May 2007.  We have CT scans from June 2006, November 2006 and April 2007, and comparing the November 2006 and April 2007 scans, there was near complete response. He had subsequent therapy in [place] as detailed below, but with decreased response, leading to allogeneic stem-cell transplant at the [facility] 02/24/2012.  INTERVAL HISTORY: Timothy [name] returns with his wife Timothy [name]  for followup, status post allogeneic stem cell transplant in [facility] March 2013.     REVIEW OF SYSTEMS: He's had "a good week". He has more energy, and he walks 30 minutes daily, with Timothy [name]. He is walking further with fewer stops than before. He thinks she is going between a half and three quarters of a mile at this point. They do walk outside. He is having no mouth sores that he is aware of, no difficulty swallowing, and is having daily normal bowel movements (small round balls, not too hard, but formed). He's had no pain, fever, sweats, or other systemic symptoms. There have been no unusual headaches, visual changes, neck stiffness, or vomiting. He did have minimal nausea once last week. Overall a side from easy bruising his review of systems today was benign  PAST MEDICAL HISTORY: Past Medical History  Diagnosis Date  . 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 Diverticular disease   . Hyperlipidemia   .  Obesity   . Hypertension   . Hiatal hernia   . CMV (cytomegalovirus) antibody positive     pre-transplant, with seroconversion x2 pst-transplant  . Right bundle branch block     pre-transplant  . CKD (chronic kidney disease) stage 2, GFR 60-89 ml/min   . Pancytopenia   . Steroid-induced diabetes   . Atrial fibrillation     post-transplant  . Myopathy   . 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 Leukemia, chronic lymphoid   . 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 Tonsillectomy and adenoidectomy   . Bone marrow transplant     FAMILY HISTORY Family History  Problem Relation Age of Onset  . Cancer Father    The patient's father died from complications of chronic lymphocytic leukemia at the age of 62.  It had been diagnosed seven years before when he was 56.  The patient is enrolled in a familial chronic lymphocytic leukemia study out of the [facility].  The patient's mother is 52, alive, unfortunately suffering with dementia, and he has a brother, 26, who is otherwise in fair health.   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  His wife of >40 years, Timothy [name], is a homemaker.  Their daughter, Timothy [name], lives in [place].  She also is a Futures trader.  The patient has an 21 year old grandson and an 69-year-old granddaughter, and  that is really the main reason he moved to this area.  He is a International aid/development worker.     ADVANCED DIRECTIVES:  HEALTH MAINTENANCE: History  Substance Use Topics  . Smoking status: Never Smoker   . Smokeless tobacco: Never Used  . Alcohol Use: No     Colonoscopy:  PAP:  Bone density:  Lipid panel:  Allergies  Allergen Reactions  . Benadryl (Diphenhydramine Hcl)     "Restless leg syndrome"    Current Outpatient Prescriptions  Medication Sig Dispense  Refill  . acyclovir (ZOVIRAX) 400 MG tablet Take 400 mg by mouth 2 (two) times daily.      . Calcium Citrate (CITRACAL PO) Take 1,200 mg by mouth 2 (two) times daily.      . chlorhexidine (PERIDEX) 0.12 % solution       . cholecalciferol (VITAMIN D) 1000 UNITS tablet Take 1,000 Units by mouth daily.      Marland Kitchen dextrose 5 % SOLN 50 mL with magnesium sulfate 50 % SOLN 1 g Inject 1 g into the vein 3 (three) times a week. Gets this at [facility]      . filgrastim (NEUPOGEN) 480 MCG/1.6ML injection Inject 480 mcg into the skin 3 (three) times a week.       . heparin lock flush 100 UNIT/ML SOLN Use as directed for hickman catheter care  60 Syringe  3  . labetalol (NORMODYNE) 200 MG tablet Take 200 mg by mouth 2 (two) times daily.      Marland Kitchen levofloxacin (LEVAQUIN) 750 MG tablet Take 1 tablet (750 mg total) by mouth daily.  30 tablet  1  . lisinopril (PRINIVIL,ZESTRIL) 10 MG tablet Take 10 mg by mouth at bedtime.      Marland Kitchen LORazepam (ATIVAN) 0.5 MG tablet Take 0.5 mg by mouth every 6 (six) hours as needed. anxiety      . Multiple Vitamins-Minerals (CENTRUM SILVER ADULT 50+) TABS Take 1 tablet by mouth daily.      . Omega-3 Fatty Acids (FISH OIL) 300 MG CAPS Take 600 mg by mouth daily.      Marland Kitchen omeprazole (PRILOSEC) 20 MG capsule Take 20 mg by mouth daily.      . ondansetron (ZOFRAN-ODT) 8 MG disintegrating tablet Take 1 tablet (8 mg total) by mouth 2 (two) times daily.  30 tablet  1  . oxycodone (OXY-IR) 5 MG capsule Take 1 capsule (5 mg total) by mouth every 6 (six) hours as needed. Pain  30 capsule  0  . predniSONE (DELTASONE) 5 MG tablet Take 1 tablet (5 mg total) by mouth 2 (two) times daily. Take 2 tablets (10 mg) with breakfast, one tablet (5 mg) with supper  90 tablet  3  . prochlorperazine (COMPAZINE) 10 MG tablet Take 10 mg by mouth every 6 (six) hours as needed. Nausea      . sertraline (ZOLOFT) 50 MG tablet Take 50-100 mg by mouth daily. Takes 50 mg one day and 100 mg the next day      . Sodium Chloride Flush  0.9 % SOLN Use as directed for hickman catheter flushes  60 Syringe  3  . sulfamethoxazole-trimethoprim (BACTRIM DS) 800-160 MG per tablet Take 1 tablet by mouth. Takes twice per week      . Alum & Mag Hydroxide-Simeth (MAGIC MOUTHWASH W/LIDOCAINE) SOLN Take 5 mLs by mouth 4 (four) times daily as needed. Mouth sores      . antiseptic oral rinse (BIOTENE) LIQD 15 mLs by Mouth Rinse route 2 times  daily at 12 noon and 4 pm.  237 mL  6  . tacrolimus (PROGRAF) 1 MG capsule Take 1 mg by mouth 2 (two) times daily. Takes with a 0.5 mg capsule       No current facility-administered medications for this visit.   Facility-Administered Medications Ordered in Other Visits  Medication Dose Route Frequency Provider Last Rate Last Dose  . sodium chloride 0.9 % injection 10 mL  10 mL Intravenous PRN Lowella Dell, MD   10 mL at 08/11/12 1606    OBJECTIVE: Middle-aged white male who appears more alert and moves more easily than the last time I saw him  Filed Vitals:   10/14/12 1535  BP: 147/82  Pulse: 83  Temp: 98.4 F (36.9 [name])  Resp: 20     Body mass index is 28.75 kg/(m^2).    Filed Weights   10/14/12 1535  Weight: 189 lb 1.6 oz (85.775 kg)   ECOG FS: 2  Sclerae unicteric  Oropharynx clear with no lesions or thrush No cervical or supraclavicular adenopathy  Lungs clear to auscultation,  no rales or rhonchi  Heart regular rate and rhythm, no murmur appreciated Abd benign, specifically +BS, soft, nontender to palpation  MSK  minimal bilateral ankle edema No upper extremity edema noted Neuro: nonfocal, alert and oriented x 3, positive affect, focuses well Skin:  No evident rash    LAB RESULTS:  CMV    Negative  10/06/2012  Negative  09/29/2012  Negative  09/24/2012  Negative   09/15/2012  Positive, <200  09/08/2012   Lab Results  Component Value Date   WBC 6.3 10/14/2012   NEUTROABS 4.7 10/14/2012   HGB 12.0* 10/14/2012   HCT 35.3* 10/14/2012   MCV 97.5 10/14/2012   PLT 154  10/14/2012      Chemistry      Component Value Date/Time   NA 137 10/14/2012 1520   NA 136 08/26/2012 0540   K 3.9 10/14/2012 1520   K 3.7 08/26/2012 0540   CL 103 10/14/2012 1520   CL 107 08/26/2012 0540   CO2 25 10/14/2012 1520   CO2 21 08/26/2012 0540   BUN 16.0 10/14/2012 1520   BUN 8 08/26/2012 0540   CREATININE 0.8 10/14/2012 1520   CREATININE 1.03 08/26/2012 0540      Component Value Date/Time   CALCIUM 9.3 10/14/2012 1520   CALCIUM 8.2* 08/26/2012 0540   ALKPHOS 65 10/14/2012 1520   ALKPHOS 84 08/26/2012 0540   AST 29 10/14/2012 1520   AST 18 08/26/2012 0540   ALT 12 10/14/2012 1520   ALT 7 08/26/2012 0540   BILITOT 0.40 10/14/2012 1520   BILITOT 0.2* 08/26/2012 0540     IgG:  740 10/06/2012   LDH: 331 10/14/2012   Lipid Panel     Component Value Date/Time   CHOL 225* 08/20/2012 1341   TRIG 301* 08/20/2012 1341   HDL 23* 08/20/2012 1341   CHOLHDL 9.8 08/20/2012 1341   VLDL 60* 08/20/2012 1341   LDLCALC 142* 08/20/2012 1341    Magnesium was 2.0 on 10/14/2012. Results for Timothy [name], Timothy [name] (MRN [id number]) as of 10/14/2012 17:26  Ref. Range 07/21/2012 10:20 07/28/2012 15:22 08/04/2012 14:45 08/06/2012 15:10 08/11/2012 09:17  Tacrolimus Lvl Latest Range: 5.0-20.0 ng/mL 3.9 (L) 9.7 9.0 6.9 6.9    STUDIES:  No recent studies.   ASSESSMENT: 66 y.o. [place] man with a history of well-differentiated lymphocytic lymphoma/ chronic lymphoid leukemia initially diagnosed in 2000, not requiring intervention until 2006; with  multiple chromosomal abnormalities.  His treatment history is as follows:  (1) fludarabine/cyclophosphamide/rituximab x5 completed May 2007.  (2) rituximab for 8 doses October 2010, with partial response  (3) Leustatin and ofatumumab weekly x8 July to September 2011 followed by maintenance ofatumumab maintenance ofatumumab every 2 months, with initial response but rising counts September 2012  (4) status-post unrelated donor stem-cell transplant 02/24/2012 at the [facility]   (a) conditioning regimen consisted of fludarabine + TBI at 200 cGy, followed by rituximab x27;  (b) CMV reactivation x3 (patient CMV positive, donor negative), s/p ganciclovir treatment; 3d reactivation August 2013, s/p gancyclovir, with negative PCR mid-September 2013; last gancyclovir dose 10/06/2012 ([name]) GVHD: involving gut and skin, treated with steroids, tacrolimus and MMF  (d) atrial fibrillation: resolved on amiodarone  (e) steroid-induced myopathy: improving  (f) hypomagnesemia: on continuing replacement (g) hypogammaglobulinemia: s/p IVIG most recently 10/01/2012 (h) elevated triglycerides (606 on 07/14/2012)  (i) adrenal insufficiency: on prednisone taper (j) pancytopenia; resolving; neupogen now PRN neutrophil count <1800  (5) restaging studies September 2013  including CT scans, flow cytometry, and bone marrow biopsy, showed no evidence of residual chronic lymphoid leukemia.  PLAN: Timothy [name] is doing well. His current dose of prednisone is 7.5 mg in the morning, and 2.5 mg in the evening. He only started this on October 21. I would continue on this dose level for another week, then go to 5 mg in the morning and 2.5 mg in the evening, and continued on that for one month. We would then reassess.  Is tacrolimus is being tapered as per [place]'s protocol. There is no evidence of graft-versus-host reactivation. His CMV remains negative, and we will continue to check that on a weekly basis.  We have been giving him intravenous fluids with magnesium twice a week, we're going to do that weekly starting this coming week. If his magnesium level is acceptable at that point we will continue weekly for another month before considering stopping.  We will replace his immunoglobulins as it is the level drops below 500. Meanwhile he will try to increase his exercise tolerance. Timothy [name] is beginning to think about the future, and making plans. This is very favorable. They know to call for any problems that  may develop before the next visit.   Marland KitchenMAGRINAT,Timothy [name]    10/14/2012

## 2012-10-14 NOTE — Patient Instructions (Signed)
Hypomagnesemia Magnesium is a common ion (mineral) in the body which is needed for metabolism. It is about how the body handles food and other chemical reactions necessary for life. Only about 2% of the magnesium in our body is found in the blood. When this is low, it is called hypomagnesemia. The blood will measure only a tiny amount of the magnesium in our body. When it is low in our blood, it does not mean that the whole body supply is low. The normal serum concentration ranges from 1.8-2.5 mEq/L. When the level gets to be less than 1.0 mEq/L, a number of problems begin to happen.  CAUSES   Receiving intravenous fluids without magnesium replacement.  Loss of magnesium from the bowel by naso-gastric suction.  Loss of magnesium from nausea and vomiting or severe diarrhea. Any of the inflammatory bowel conditions can cause this.  Abuse of alcohol often leads to low serum magnesium.  An inherited form of magnesium loss happens when the kidneys lose magnesium. This is called familial or primary hypomagnesemia.  Some medications such as diuretics also cause the loss of magnesium. SYMPTOMS  These following problems are worse if the changes in magnesium levels come on suddenly.  Tremor.  Confusion.  Muscle weakness.  Over-sensitive to sights and sounds.  Sensitive reflexes.  Depression.  Muscular fibrillations.  Over-reactivity of the nerves.  Irritability.  Psychosis.  Spasms of the hand muscles.  Tetany (where the muscles go into uncontrollable spasms). DIAGNOSIS  This condition can be diagnosed by blood tests. TREATMENT   In emergency, magnesium can be given intravenously (by vein).  If the condition is less worrisome, it can be corrected by diet. High levels of magnesium are found in green leafy vegetables, peas, beans and nuts among other things. It can also be given through medications by mouth.  If it is being caused by medications, changes can be made.  If  alcohol is a problem, help is available if there are difficulties giving it up. Document Released: 09/03/2005 Document Revised: 03/01/2012 Document Reviewed: 07/28/2008 [employer] Patient Information 2013 [employer].  

## 2012-10-15 ENCOUNTER — Telehealth: Payer: Self-pay | Admitting: *Deleted

## 2012-10-15 ENCOUNTER — Other Ambulatory Visit: Payer: Self-pay | Admitting: *Deleted

## 2012-10-15 DIAGNOSIS — C9110 Chronic lymphocytic leukemia of B-cell type not having achieved remission: Secondary | ICD-10-CM

## 2012-10-15 MED ORDER — LISINOPRIL 10 MG PO TABS: 10.0000 mg | ORAL_TABLET | Freq: Every day | ORAL | Status: DC

## 2012-10-15 MED ORDER — ACYCLOVIR 400 MG PO TABS: 400.0000 mg | ORAL_TABLET | Freq: Two times a day (BID) | ORAL | Status: DC

## 2012-10-15 NOTE — Telephone Encounter (Signed)
Per staff message and POF I have scheduled appt.  JMW  

## 2012-10-18 ENCOUNTER — Ambulatory Visit: Payer: Self-pay

## 2012-10-18 ENCOUNTER — Other Ambulatory Visit: Payer: Self-pay

## 2012-10-19 ENCOUNTER — Other Ambulatory Visit (HOSPITAL_BASED_OUTPATIENT_CLINIC_OR_DEPARTMENT_OTHER): Payer: BC Managed Care – PPO

## 2012-10-19 DIAGNOSIS — Z9489 Other transplanted organ and tissue status: Secondary | ICD-10-CM

## 2012-10-19 DIAGNOSIS — D61818 Other pancytopenia: Secondary | ICD-10-CM

## 2012-10-19 DIAGNOSIS — R768 Other specified abnormal immunological findings in serum: Secondary | ICD-10-CM

## 2012-10-19 DIAGNOSIS — D849 Immunodeficiency, unspecified: Secondary | ICD-10-CM

## 2012-10-19 DIAGNOSIS — C9110 Chronic lymphocytic leukemia of B-cell type not having achieved remission: Secondary | ICD-10-CM

## 2012-10-19 DIAGNOSIS — R894 Abnormal immunological findings in specimens from other organs, systems and tissues: Secondary | ICD-10-CM

## 2012-10-19 DIAGNOSIS — D89811 Chronic graft-versus-host disease: Secondary | ICD-10-CM

## 2012-10-19 LAB — CBC WITH DIFFERENTIAL/PLATELET
BASO%: 0.5 % (ref 0.0–2.0)
EOS%: 0.4 % (ref 0.0–7.0)
MCHC: 34.1 g/dL (ref 32.0–36.0)
MONO#: 0.5 10*3/uL (ref 0.1–0.9)
RBC: 3.61 10*6/uL — ABNORMAL LOW (ref 4.20–5.82)
RDW: 15.9 % — ABNORMAL HIGH (ref 11.0–14.6)
WBC: 5.8 10*3/uL (ref 4.0–10.3)
lymph#: 0.8 10*3/uL — ABNORMAL LOW (ref 0.9–3.3)

## 2012-10-19 LAB — MAGNESIUM (CC13): Magnesium: 2.3 mg/dl (ref 1.5–2.5)

## 2012-10-20 ENCOUNTER — Other Ambulatory Visit: Payer: Self-pay | Admitting: Lab

## 2012-10-20 ENCOUNTER — Ambulatory Visit: Payer: Self-pay

## 2012-10-20 ENCOUNTER — Ambulatory Visit: Payer: Self-pay | Admitting: Physician Assistant

## 2012-10-21 ENCOUNTER — Ambulatory Visit (HOSPITAL_BASED_OUTPATIENT_CLINIC_OR_DEPARTMENT_OTHER): Payer: BC Managed Care – PPO

## 2012-10-21 ENCOUNTER — Other Ambulatory Visit (HOSPITAL_BASED_OUTPATIENT_CLINIC_OR_DEPARTMENT_OTHER): Payer: BC Managed Care – PPO

## 2012-10-21 ENCOUNTER — Encounter: Payer: Self-pay | Admitting: Physician Assistant

## 2012-10-21 ENCOUNTER — Telehealth: Payer: Self-pay | Admitting: Oncology

## 2012-10-21 ENCOUNTER — Ambulatory Visit (HOSPITAL_BASED_OUTPATIENT_CLINIC_OR_DEPARTMENT_OTHER): Payer: BC Managed Care – PPO | Admitting: Physician Assistant

## 2012-10-21 VITALS — BP 109/69 | HR 82 | Temp 97.9°F | Resp 20 | Ht 68.0 in | Wt 185.4 lb

## 2012-10-21 DIAGNOSIS — R894 Abnormal immunological findings in specimens from other organs, systems and tissues: Secondary | ICD-10-CM

## 2012-10-21 DIAGNOSIS — E860 Dehydration: Secondary | ICD-10-CM

## 2012-10-21 DIAGNOSIS — R768 Other specified abnormal immunological findings in serum: Secondary | ICD-10-CM

## 2012-10-21 DIAGNOSIS — Z9489 Other transplanted organ and tissue status: Secondary | ICD-10-CM

## 2012-10-21 DIAGNOSIS — D89811 Chronic graft-versus-host disease: Secondary | ICD-10-CM

## 2012-10-21 DIAGNOSIS — D801 Nonfamilial hypogammaglobulinemia: Secondary | ICD-10-CM

## 2012-10-21 DIAGNOSIS — C9110 Chronic lymphocytic leukemia of B-cell type not having achieved remission: Secondary | ICD-10-CM

## 2012-10-21 DIAGNOSIS — R42 Dizziness and giddiness: Secondary | ICD-10-CM

## 2012-10-21 DIAGNOSIS — E291 Testicular hypofunction: Secondary | ICD-10-CM

## 2012-10-21 LAB — CBC WITH DIFFERENTIAL/PLATELET
BASO%: 0.8 % (ref 0.0–2.0)
Basophils Absolute: 0 10*3/uL (ref 0.0–0.1)
HCT: 34.7 % — ABNORMAL LOW (ref 38.4–49.9)
HGB: 11.7 g/dL — ABNORMAL LOW (ref 13.0–17.1)
MCHC: 33.7 g/dL (ref 32.0–36.0)
MONO#: 0.6 10*3/uL (ref 0.1–0.9)
NEUT%: 60.4 % (ref 39.0–75.0)
RDW: 15.4 % — ABNORMAL HIGH (ref 11.0–14.6)
WBC: 3.7 10*3/uL — ABNORMAL LOW (ref 4.0–10.3)
lymph#: 0.9 10*3/uL (ref 0.9–3.3)

## 2012-10-21 LAB — COMPREHENSIVE METABOLIC PANEL (CC13)
Alkaline Phosphatase: 65 U/L (ref 40–150)
BUN: 25 mg/dL (ref 7.0–26.0)
Creatinine: 0.9 mg/dL (ref 0.7–1.3)
Glucose: 110 mg/dl — ABNORMAL HIGH (ref 70–99)
Total Bilirubin: 0.33 mg/dL (ref 0.20–1.20)

## 2012-10-21 LAB — MAGNESIUM (CC13): Magnesium: 2 mg/dl (ref 1.5–2.5)

## 2012-10-21 MED ORDER — SODIUM CHLORIDE 0.9 % IV SOLN
Freq: Once | INTRAVENOUS | Status: AC
  Administered 2012-10-21: 12:00:00 via INTRAVENOUS
  Filled 2012-10-21: qty 1000

## 2012-10-21 MED ORDER — MECLIZINE HCL 25 MG PO TABS: ORAL_TABLET | ORAL | Status: DC

## 2012-10-21 NOTE — Patient Instructions (Signed)
Hypomagnesemia Magnesium is a common ion (mineral) in the body which is needed for metabolism. It is about how the body handles food and other chemical reactions necessary for life. Only about 2% of the magnesium in our body is found in the blood. When this is low, it is called hypomagnesemia. The blood will measure only a tiny amount of the magnesium in our body. When it is low in our blood, it does not mean that the whole body supply is low. The normal serum concentration ranges from 1.8-2.5 mEq/L. When the level gets to be less than 1.0 mEq/L, a number of problems begin to happen.  CAUSES   Receiving intravenous fluids without magnesium replacement.  Loss of magnesium from the bowel by naso-gastric suction.  Loss of magnesium from nausea and vomiting or severe diarrhea. Any of the inflammatory bowel conditions can cause this.  Abuse of alcohol often leads to low serum magnesium.  An inherited form of magnesium loss happens when the kidneys lose magnesium. This is called familial or primary hypomagnesemia.  Some medications such as diuretics also cause the loss of magnesium. SYMPTOMS  These following problems are worse if the changes in magnesium levels come on suddenly.  Tremor.  Confusion.  Muscle weakness.  Over-sensitive to sights and sounds.  Sensitive reflexes.  Depression.  Muscular fibrillations.  Over-reactivity of the nerves.  Irritability.  Psychosis.  Spasms of the hand muscles.  Tetany (where the muscles go into uncontrollable spasms). DIAGNOSIS  This condition can be diagnosed by blood tests. TREATMENT   In emergency, magnesium can be given intravenously (by vein).  If the condition is less worrisome, it can be corrected by diet. High levels of magnesium are found in green leafy vegetables, peas, beans and nuts among other things. It can also be given through medications by mouth.  If it is being caused by medications, changes can be made.  If  alcohol is a problem, help is available if there are difficulties giving it up. Document Released: 09/03/2005 Document Revised: 03/01/2012 Document Reviewed: 07/28/2008 [employer] Patient Information 2013 [employer].  

## 2012-10-21 NOTE — Progress Notes (Signed)
ID: [name]   DOB: Jan 25, 1946  MR#: [id number]  WGN#:[id number]  HISTORY OF PRESENT ILLNESS: We have very complete records from Dr. Sydnee Levans in [place], and in summary:  The patient was initially diagnosed in August 2000, with a white cell count of 23,600, but normal hemoglobin and platelets, and no significant symptomatology. Over the next several years his white cell count drifted up, and he eventually developed some symptoms of night sweats in particular, leading to treatment with fludarabine, Cytoxan and rituxan for five cycles given between December 2006 and May 2007.  We have CT scans from June 2006, November 2006 and April 2007, and comparing the November 2006 and April 2007 scans, there was near complete response. He had subsequent therapy in [place] as detailed below, but with decreased response, leading to allogeneic stem-cell transplant at the [facility] 02/24/2012.  INTERVAL HISTORY: Add returns with his wife [name]  for followup, status post allogeneic stem cell transplant in [facility] March 2013.     REVIEW OF SYSTEMS: Mort is feeling well. He is exercising more. He is walking daily, and is increasing his distance. His energy level has increased. He has had no recent fevers, chills, or night sweats. She's had no unexplained weight loss. He's had no recent problems with nausea or emesis and is having regular bowel movements. No cough, increased shortness of breath, or chest pain. No abnormal headaches. He has had some vertigo over the past week, which seems to take place when he changes position or turns his head to quickly. He tells me he has had this in the past. It is not associated with any other complaints. He denies any additional pain, and has had no myalgias, arthralgias, or bony pain.  A detailed review of systems is otherwise stable and noncontributory.   PAST MEDICAL HISTORY: Past Medical History  Diagnosis Date  . Transplant recipient  07/12/2012  . 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 Diverticular disease   . Hyperlipidemia   . Obesity   . Hypertension   . Hiatal hernia   . CMV (cytomegalovirus) antibody positive     pre-transplant, with seroconversion x2 pst-transplant  . Right bundle branch block     pre-transplant  . CKD (chronic kidney disease) stage 2, GFR 60-89 ml/min   . Pancytopenia   . Steroid-induced diabetes   . Atrial fibrillation     post-transplant  . Myopathy   . 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 Leukemia, chronic lymphoid   . 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 Tonsillectomy and adenoidectomy   . Bone marrow transplant     FAMILY HISTORY Family History  Problem Relation Age of Onset  . Cancer Father    The patient's father died from complications of chronic lymphocytic leukemia at the age of 47.  It had been diagnosed seven years before when he was 69.  The patient is enrolled in a familial chronic lymphocytic leukemia study out of the [facility].  The patient's mother is 69, alive, unfortunately suffering with dementia, and he has a brother, 44, who is otherwise in fair health.   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  His wife of >40 years, [name], is a homemaker.  Their daughter, [name], lives in [place].  She also is  a homemaker.  The patient has an 24 year old grandson and an 9-year-old granddaughter, and that is really the main reason he moved to this area.  He is a International aid/development worker.     ADVANCED DIRECTIVES:  HEALTH MAINTENANCE: History  Substance Use Topics  . Smoking status: Never Smoker   . Smokeless tobacco: Never Used  . Alcohol Use: No     Colonoscopy:  PAP:  Bone density:  Lipid panel:  Allergies  Allergen Reactions  . Benadryl (Diphenhydramine  Hcl)     "Restless leg syndrome"    Current Outpatient Prescriptions  Medication Sig Dispense Refill  . acyclovir (ZOVIRAX) 400 MG tablet Take 1 tablet (400 mg total) by mouth 2 (two) times daily.  60 tablet  2  . Alum & Mag Hydroxide-Simeth (MAGIC MOUTHWASH W/LIDOCAINE) SOLN Take 5 mLs by mouth 4 (four) times daily as needed. Mouth sores      . antiseptic oral rinse (BIOTENE) LIQD 15 mLs by Mouth Rinse route 2 times daily at 12 noon and 4 pm.  237 mL  6  . Calcium Citrate (CITRACAL PO) Take 1,200 mg by mouth 2 (two) times daily.      . chlorhexidine (PERIDEX) 0.12 % solution       . cholecalciferol (VITAMIN D) 1000 UNITS tablet Take 1,000 Units by mouth daily.      Marland Kitchen dextrose 5 % SOLN 50 mL with magnesium sulfate 50 % SOLN 1 g Inject 1 g into the vein 3 (three) times a week. Gets [facility] at [facility]      . filgrastim (NEUPOGEN) 480 MCG/1.6ML injection Inject 480 mcg into the skin 3 (three) times a week.       . heparin lock flush 100 UNIT/ML SOLN Use as directed for hickman catheter care  60 Syringe  3  . labetalol (NORMODYNE) 200 MG tablet Take 200 mg by mouth 2 (two) times daily.      Marland Kitchen levofloxacin (LEVAQUIN) 750 MG tablet Take 1 tablet (750 mg total) by mouth daily.  30 tablet  1  . lisinopril (PRINIVIL,ZESTRIL) 10 MG tablet Take 1 tablet (10 mg total) by mouth at bedtime.  30 tablet  2  . LORazepam (ATIVAN) 0.5 MG tablet Take 0.5 mg by mouth every 6 (six) hours as needed. anxiety      . meclizine (ANTIVERT) 25 MG tablet 1/2 - 1 tab PO BID PRN vertigo  30 tablet  0  . Multiple Vitamins-Minerals (CENTRUM SILVER ADULT 50+) TABS Take 1 tablet by mouth daily.      . Omega-3 Fatty Acids (FISH OIL) 300 MG CAPS Take 600 mg by mouth daily.      Marland Kitchen omeprazole (PRILOSEC) 20 MG capsule Take 20 mg by mouth daily.      . ondansetron (ZOFRAN-ODT) 8 MG disintegrating tablet Take 1 tablet (8 mg total) by mouth 2 (two) times daily.  30 tablet  1  . oxycodone (OXY-IR) 5 MG capsule Take 1 capsule (5 mg total) by  mouth every 6 (six) hours as needed. Pain  30 capsule  0  . predniSONE (DELTASONE) 5 MG tablet Take 1 tablet (5 mg total) by mouth 2 (two) times daily. Take 2 tablets (10 mg) with breakfast, one tablet (5 mg) with supper  90 tablet  3  . prochlorperazine (COMPAZINE) 10 MG tablet Take 10 mg by mouth every 6 (six) hours as needed. Nausea      . sertraline (ZOLOFT) 50 MG tablet Take 50-100 mg by mouth daily. Takes 50 mg  one day and 100 mg the next day      . Sodium Chloride Flush 0.9 % SOLN Use as directed for hickman catheter flushes  60 Syringe  3  . sulfamethoxazole-trimethoprim (BACTRIM DS) 800-160 MG per tablet Take 1 tablet by mouth. Takes twice per week      . tacrolimus (PROGRAF) 1 MG capsule Take 1 mg by mouth 2 (two) times daily. Takes with a 0.5 mg capsule       No current facility-administered medications for this visit.   Facility-Administered Medications Ordered in Other Visits  Medication Dose Route Frequency Provider Last Rate Last Dose  . sodium chloride 0.9 % 1,000 mL with magnesium sulfate 1 g infusion   Intravenous Once Lowella Dell, MD      . sodium chloride 0.9 % injection 10 mL  10 mL Intravenous PRN Lowella Dell, MD   10 mL at 08/11/12 1606    OBJECTIVE: Middle-aged white male who appears alert, comfortable, and in no acute distress Filed Vitals:   10/21/12 1056  BP: 109/69  Pulse: 82  Temp: 97.9 F (36.6 C)  Resp: 20     Body mass index is 28.19 kg/(m^2).    Filed Weights   10/21/12 1056  Weight: 185 lb 6.4 oz (84.097 kg)   ECOG FS: 1  Sclerae unicteric  Oropharynx clear with no lesions or thrush No cervical or supraclavicular adenopathy  Lungs clear to auscultation,  no rales or rhonchi  Heart regular rate and rhythm, no murmur appreciated Abd benign, specifically +BS, soft, nontender to palpation  MSK  minimal bilateral ankle edema, nonpitting. No upper extremity edema noted Neuro: nonfocal, alert and oriented x 3, positive affect,  Skin:  No  evident rash    LAB RESULTS:  CMV    Negative  10/14/2012  Negative  10/06/2012  Negative  09/29/2012  Negative  09/24/2012  Negative   09/15/2012  Positive, <200  09/08/2012   Lab Results  Component Value Date   WBC 3.7* 10/21/2012   NEUTROABS 2.3 10/21/2012   HGB 11.7* 10/21/2012   HCT 34.7* 10/21/2012   MCV 94.0 10/21/2012   PLT 167 10/21/2012      Chemistry      Component Value Date/Time   NA 136 10/21/2012 1041   NA 136 08/26/2012 0540   K 4.0 10/21/2012 1041   K 3.7 08/26/2012 0540   CL 106 10/21/2012 1041   CL 107 08/26/2012 0540   CO2 23 10/21/2012 1041   CO2 21 08/26/2012 0540   BUN 25.0 10/21/2012 1041   BUN 8 08/26/2012 0540   CREATININE 0.9 10/21/2012 1041   CREATININE 1.03 08/26/2012 0540      Component Value Date/Time   CALCIUM 9.0 10/21/2012 1041   CALCIUM 8.2* 08/26/2012 0540   ALKPHOS 65 10/21/2012 1041   ALKPHOS 84 08/26/2012 0540   AST 20 10/21/2012 1041   AST 18 08/26/2012 0540   ALT 13 10/21/2012 1041   ALT 7 08/26/2012 0540   BILITOT 0.33 10/21/2012 1041   BILITOT 0.2* 08/26/2012 0540     IgG:  740 10/06/2012   LDH: 331 10/14/2012   Lipid Panel     Component Value Date/Time   CHOL 225* 08/20/2012 1341   TRIG 301* 08/20/2012 1341   HDL 23* 08/20/2012 1341   CHOLHDL 9.8 08/20/2012 1341   VLDL 60* 08/20/2012 1341   LDLCALC 142* 08/20/2012 1341    Magnesium was 2.0 on 10/14/2012. Results for JUDD, MCCUBBIN (MRN [id number])  as of 10/14/2012 17:26  Ref. Range 07/21/2012 10:20 07/28/2012 15:22 08/04/2012 14:45 08/06/2012 15:10 08/11/2012 09:17  Tacrolimus Lvl Latest Range: 5.0-20.0 ng/mL 3.9 (L) 9.7 9.0 6.9 6.9    STUDIES:  No recent studies.   ASSESSMENT: 66 y.o. [place] man with a history of well-differentiated lymphocytic lymphoma/ chronic lymphoid leukemia initially diagnosed in 2000, not requiring intervention until 2006; with multiple chromosomal abnormalities.  His treatment history is as follows:  (1) fludarabine/cyclophosphamide/rituximab x5  completed May 2007.  (2) rituximab for 8 doses October 2010, with partial response  (3) Leustatin and ofatumumab weekly x8 July to September 2011 followed by maintenance ofatumumab maintenance ofatumumab every 2 months, with initial response but rising counts September 2012  (4) status-post unrelated donor stem-cell transplant 02/24/2012 at the [facility]  (a) conditioning regimen consisted of fludarabine + TBI at 200 cGy, followed by rituximab x27;  (b) CMV reactivation x3 (patient CMV positive, donor negative), s/p ganciclovir treatment; 3d reactivation August 2013, s/p gancyclovir, with negative PCR mid-September 2013; last gancyclovir dose 10/06/2012 (c) GVHD: involving gut and skin, treated with steroids, tacrolimus and MMF  (d) atrial fibrillation: resolved on amiodarone  (e) steroid-induced myopathy: improving  (f) hypomagnesemia: on continuing replacement (g) hypogammaglobulinemia: s/p IVIG most recently 10/01/2012 (h) elevated triglycerides (606 on 07/14/2012)  (i) adrenal insufficiency: on prednisone taper (j) pancytopenia; resolving; neupogen now PRN neutrophil count <1800  (5) restaging studies September 2013  including CT scans, flow cytometry, and bone marrow biopsy, showed no evidence of residual chronic lymphoid leukemia.  PLAN: Teigen continues to do very well. We are going to decrease his prednisone slightly to 5 mg in the morning and 2.5 mg in the evening, and will continue that for one month at which time we will reassess. His tacrolimus is being tapered as per [place]'s protocol. There is no evidence of graft-versus-host reactivation. His CMV remains negative, and we will continue to check that on a weekly basis.  He will receive supportive IV fluids with IV magnesium today and once weekly. If his magnesium level is acceptable today, we will continue this regimen weekly for another month for supportive measures.   We will replace his immunoglobulins if is IgG level drops  below 500.   I have prescribed meclizine to take up to twice daily for vertigo, but I have asked to contact us if this increases, is associated with headaches, or any other complaints. A[facility] [place] and [place] both voice understanding and agreement with this plan and will call with any changes or problems.  Zollie Scale    10/21/2012

## 2012-10-21 NOTE — Telephone Encounter (Signed)
Sent michelle a  Staff message to add the appt in on 10/27/2012

## 2012-10-22 ENCOUNTER — Other Ambulatory Visit: Payer: Self-pay | Admitting: Physician Assistant

## 2012-10-22 ENCOUNTER — Telehealth: Payer: Self-pay | Admitting: Oncology

## 2012-10-22 DIAGNOSIS — D849 Immunodeficiency, unspecified: Secondary | ICD-10-CM

## 2012-10-22 DIAGNOSIS — C9110 Chronic lymphocytic leukemia of B-cell type not having achieved remission: Secondary | ICD-10-CM

## 2012-10-22 DIAGNOSIS — Z9489 Other transplanted organ and tissue status: Secondary | ICD-10-CM

## 2012-10-22 DIAGNOSIS — D89811 Chronic graft-versus-host disease: Secondary | ICD-10-CM

## 2012-10-22 LAB — IGG, IGA, IGM
IgA: <6 mg/dL — ABNORMAL LOW (ref 68–379)
IgG (Immunoglobin G), Serum: 418 mg/dL — ABNORMAL LOW (ref 650–1600)
IgM, Serum: <5 mg/dL — ABNORMAL LOW (ref 41–251)

## 2012-10-22 NOTE — Telephone Encounter (Signed)
S/w the pt and he is aware of his nov appts °

## 2012-10-25 ENCOUNTER — Ambulatory Visit (HOSPITAL_BASED_OUTPATIENT_CLINIC_OR_DEPARTMENT_OTHER): Payer: BC Managed Care – PPO

## 2012-10-25 VITALS — BP 141/80 | HR 74 | Temp 98.2°F | Resp 20

## 2012-10-25 DIAGNOSIS — D849 Immunodeficiency, unspecified: Secondary | ICD-10-CM

## 2012-10-25 DIAGNOSIS — C9110 Chronic lymphocytic leukemia of B-cell type not having achieved remission: Secondary | ICD-10-CM

## 2012-10-25 DIAGNOSIS — Z9489 Other transplanted organ and tissue status: Secondary | ICD-10-CM

## 2012-10-25 DIAGNOSIS — D801 Nonfamilial hypogammaglobulinemia: Secondary | ICD-10-CM

## 2012-10-25 DIAGNOSIS — D89811 Chronic graft-versus-host disease: Secondary | ICD-10-CM

## 2012-10-25 MED ORDER — IMMUNE GLOBULIN (HUMAN) 10 GM/100ML IV SOLN
400.0000 mg/kg | INTRAVENOUS | Status: DC | PRN
  Filled 2012-10-25: qty 350

## 2012-10-25 MED ORDER — IMMUNE GLOBULIN (HUMAN) 5 GM/100ML IV SOLN
35.0000 g | INTRAVENOUS | Status: DC
  Administered 2012-10-25: 35 g via INTRAVENOUS
  Filled 2012-10-25: qty 700

## 2012-10-25 NOTE — Patient Instructions (Addendum)
[facility] Discharge Instructions for Patients Receiving Chemotherapy  Today you received the following chemotherapy agents Octagam.  BELOW ARE SYMPTOMS THAT SHOULD BE REPORTED IMMEDIATELY:  *FEVER GREATER THAN 100.5 F  *CHILLS WITH OR WITHOUT FEVER  NAUSEA AND VOMITING THAT IS NOT CONTROLLED WITH YOUR NAUSEA MEDICATION  *UNUSUAL SHORTNESS OF BREATH  *UNUSUAL BRUISING OR BLEEDING  TENDERNESS IN MOUTH AND THROAT WITH OR WITHOUT PRESENCE OF ULCERS  *URINARY PROBLEMS  *BOWEL PROBLEMS  UNUSUAL RASH Items with * indicate a potential emergency and should be followed up as soon as possible.  Feel free to call the clinic you have any questions or concerns. The clinic phone number is [phone].

## 2012-10-27 ENCOUNTER — Ambulatory Visit (HOSPITAL_BASED_OUTPATIENT_CLINIC_OR_DEPARTMENT_OTHER): Payer: BC Managed Care – PPO | Admitting: Physician Assistant

## 2012-10-27 ENCOUNTER — Other Ambulatory Visit (HOSPITAL_BASED_OUTPATIENT_CLINIC_OR_DEPARTMENT_OTHER): Payer: BC Managed Care – PPO | Admitting: Lab

## 2012-10-27 ENCOUNTER — Telehealth: Payer: Self-pay | Admitting: *Deleted

## 2012-10-27 ENCOUNTER — Encounter: Payer: Self-pay | Admitting: Physician Assistant

## 2012-10-27 ENCOUNTER — Ambulatory Visit (HOSPITAL_BASED_OUTPATIENT_CLINIC_OR_DEPARTMENT_OTHER): Payer: BC Managed Care – PPO

## 2012-10-27 VITALS — BP 125/73 | HR 89 | Temp 97.9°F | Resp 20 | Ht 68.0 in | Wt 185.0 lb

## 2012-10-27 DIAGNOSIS — D89811 Chronic graft-versus-host disease: Secondary | ICD-10-CM

## 2012-10-27 DIAGNOSIS — R768 Other specified abnormal immunological findings in serum: Secondary | ICD-10-CM

## 2012-10-27 DIAGNOSIS — C9110 Chronic lymphocytic leukemia of B-cell type not having achieved remission: Secondary | ICD-10-CM

## 2012-10-27 DIAGNOSIS — D849 Immunodeficiency, unspecified: Secondary | ICD-10-CM

## 2012-10-27 DIAGNOSIS — R894 Abnormal immunological findings in specimens from other organs, systems and tissues: Secondary | ICD-10-CM

## 2012-10-27 DIAGNOSIS — D709 Neutropenia, unspecified: Secondary | ICD-10-CM

## 2012-10-27 DIAGNOSIS — D801 Nonfamilial hypogammaglobulinemia: Secondary | ICD-10-CM

## 2012-10-27 DIAGNOSIS — E860 Dehydration: Secondary | ICD-10-CM

## 2012-10-27 DIAGNOSIS — Z9489 Other transplanted organ and tissue status: Secondary | ICD-10-CM

## 2012-10-27 DIAGNOSIS — D61818 Other pancytopenia: Secondary | ICD-10-CM

## 2012-10-27 LAB — COMPREHENSIVE METABOLIC PANEL (CC13)
ALT: 19 U/L (ref 0–55)
AST: 24 U/L (ref 5–34)
Albumin: 3.3 g/dL — ABNORMAL LOW (ref 3.5–5.0)
CO2: 25 mEq/L (ref 22–29)
Calcium: 9.1 mg/dL (ref 8.4–10.4)
Chloride: 107 mEq/L (ref 98–107)
Creatinine: 1 mg/dL (ref 0.7–1.3)
Potassium: 4 mEq/L (ref 3.5–5.1)
Total Protein: 5.8 g/dL — ABNORMAL LOW (ref 6.4–8.3)

## 2012-10-27 LAB — CBC WITH DIFFERENTIAL/PLATELET
BASO%: 0.8 % (ref 0.0–2.0)
Eosinophils Absolute: 0 10*3/uL (ref 0.0–0.5)
HCT: 36.2 % — ABNORMAL LOW (ref 38.4–49.9)
LYMPH%: 67.5 % — ABNORMAL HIGH (ref 14.0–49.0)
MCHC: 34 g/dL (ref 32.0–36.0)
MONO#: 0.8 10*3/uL (ref 0.1–0.9)
NEUT#: 0 10*3/uL — CL (ref 1.5–6.5)
Platelets: 140 10*3/uL (ref 140–400)
RBC: 3.89 10*6/uL — ABNORMAL LOW (ref 4.20–5.82)
WBC: 2.6 10*3/uL — ABNORMAL LOW (ref 4.0–10.3)
lymph#: 1.7 10*3/uL (ref 0.9–3.3)
nRBC: 0 % (ref 0–0)

## 2012-10-27 LAB — LACTATE DEHYDROGENASE (CC13): LDH: 136 U/L (ref 125–220)

## 2012-10-27 MED ORDER — ACETAMINOPHEN 325 MG PO TABS
650.0000 mg | ORAL_TABLET | Freq: Once | ORAL | Status: AC
Start: 2012-10-27 — End: 2012-10-27
  Administered 2012-10-27: 650 mg via ORAL

## 2012-10-27 MED ORDER — SODIUM CHLORIDE 0.9 % IV SOLN
INTRAVENOUS | Status: AC
  Administered 2012-10-27: 13:00:00 via INTRAVENOUS
  Filled 2012-10-27: qty 1000

## 2012-10-27 MED ORDER — PEGFILGRASTIM INJECTION 6 MG/0.6ML
6.0000 mg | Freq: Once | SUBCUTANEOUS | Status: AC
  Administered 2012-10-27: 6 mg via SUBCUTANEOUS
  Filled 2012-10-27: qty 0.6

## 2012-10-27 NOTE — Progress Notes (Signed)
ID: Timothy [name]   DOB: [birth year]  MR#: [id number]  WUJ#:[id number]  HISTORY OF PRESENT ILLNESS: We have very complete records from Dr. Sydnee Levans in [place], and in summary:  The patient was initially diagnosed in August 2000, with a white cell count of 23,600, but normal hemoglobin and platelets, and no significant symptomatology. Over the next several years his white cell count drifted up, and he eventually developed some symptoms of night sweats in particular, leading to treatment with fludarabine, Cytoxan and rituxan for five cycles given between December 2006 and May 2007.  We have CT scans from June 2006, November 2006 and April 2007, and comparing the November 2006 and April 2007 scans, there was near complete response. He had subsequent therapy in [place] as detailed below, but with decreased response, leading to allogeneic stem-cell transplant at the [facility] 02/24/2012.  INTERVAL HISTORY: Timothy [name] returns with his wife Timothy [name]  for followup of his CLL, status post allogeneic stem cell transplant in [place] March 2013.     REVIEW OF SYSTEMS: Timothy [name] is feeling well, although he has had increased fatigue this past Timothy [name]. He continues to walk daily. He is eating a little better and denies any significant nausea or emesis. He is having regular bowel movements. Timothy [name] denies any fevers, chills, or night sweats. He is breathing well with no increased shortness of breath, cough, or phlegm production, and denies any chest pain or palpitations. He continues to have some mild vertigo, treated with meclizine. He has had no abnormal headaches.  He denies pain, and has had no myalgias, arthralgias, or bony pain.  A detailed review of systems is otherwise stable and noncontributory.   PAST MEDICAL HISTORY: Past Medical History  Diagnosis Date  . 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 Diverticular disease   . Hyperlipidemia   . Obesity   .  Hypertension   . Hiatal hernia   . CMV (cytomegalovirus) antibody positive     pre-transplant, with seroconversion x2 pst-transplant  . Right bundle branch block     pre-transplant  . CKD (chronic kidney disease) stage 2, GFR 60-89 ml/min   . Pancytopenia   . Steroid-induced diabetes   . Atrial fibrillation     post-transplant  . Myopathy   . 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 Leukemia, chronic lymphoid   . 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 Tonsillectomy and adenoidectomy   . Bone marrow transplant     FAMILY HISTORY Family History  Problem Relation Age of Onset  . Cancer Father    The patient's father died from complications of chronic lymphocytic leukemia at the age of 76.  It had been diagnosed seven years before when he was 6.  The patient is enrolled in a familial chronic lymphocytic leukemia study out of the [facility].  The patient's mother is 102, alive, unfortunately suffering with dementia, and he has a brother, 13, who is otherwise in fair health.   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  His wife of >40 years, Timothy [name], is a homemaker.  Their daughter, Timothy [name], lives in [place].  She also is a Futures trader.  The patient has an 13 year old grandson and an 48-year-old granddaughter, and that is really the main reason he moved to  this area.  He is a International aid/development worker.     ADVANCED DIRECTIVES:  HEALTH MAINTENANCE: History  Substance Use Topics  . Smoking status: Never Smoker   . Smokeless tobacco: Never Used  . Alcohol Use: No     Colonoscopy:  PAP:  Bone density:  Lipid panel:  Allergies  Allergen Reactions  . Benadryl (Diphenhydramine Hcl)     "Restless leg syndrome"    Current Outpatient Prescriptions  Medication Sig Dispense Refill  .  acyclovir (ZOVIRAX) 400 MG tablet Take 1 tablet (400 mg total) by mouth 2 (two) times daily.  60 tablet  2  . Alum & Mag Hydroxide-Simeth (MAGIC MOUTHWASH W/LIDOCAINE) SOLN Take 5 mLs by mouth 4 (four) times daily as needed. Mouth sores      . Calcium Citrate (CITRACAL PO) Take 1,200 mg by mouth 2 (two) times daily.      . chlorhexidine (PERIDEX) 0.12 % solution       . cholecalciferol (VITAMIN D) 1000 UNITS tablet Take 1,000 Units by mouth daily.      Marland Kitchen dextrose 5 % SOLN 50 mL with magnesium sulfate 50 % SOLN 1 g Inject 1 g into the vein 3 (three) times a Timothy [name]. Gets this at [facility]      . filgrastim (NEUPOGEN) 480 MCG/1.6ML injection Inject 480 mcg into the skin 3 (three) times a Timothy [name].       . heparin lock flush 100 UNIT/ML SOLN Use as directed for hickman catheter care  60 Syringe  3  . labetalol (NORMODYNE) 200 MG tablet Take 200 mg by mouth 2 (two) times daily.      Marland Kitchen levofloxacin (LEVAQUIN) 750 MG tablet Take 1 tablet (750 mg total) by mouth daily.  30 tablet  1  . lisinopril (PRINIVIL,ZESTRIL) 10 MG tablet Take 1 tablet (10 mg total) by mouth at bedtime.  30 tablet  2  . LORazepam (ATIVAN) 0.5 MG tablet Take 0.5 mg by mouth every 6 (six) hours as needed. anxiety      . meclizine (ANTIVERT) 25 MG tablet 1/2 - 1 tab PO BID PRN vertigo  30 tablet  0  . Multiple Vitamins-Minerals (CENTRUM SILVER ADULT 50+) TABS Take 1 tablet by mouth daily.      . Omega-3 Fatty Acids (FISH OIL) 300 MG CAPS Take 600 mg by mouth daily.      Marland Kitchen omeprazole (PRILOSEC) 20 MG capsule Take 20 mg by mouth daily.      . ondansetron (ZOFRAN-ODT) 8 MG disintegrating tablet Take 1 tablet (8 mg total) by mouth 2 (two) times daily.  30 tablet  1  . oxycodone (OXY-IR) 5 MG capsule Take 1 capsule (5 mg total) by mouth every 6 (six) hours as needed. Pain  30 capsule  0  . predniSONE (DELTASONE) 5 MG tablet Take 5 mg by mouth 2 (two) times daily. Take 1 tablet (5mg ) with breakfast, one-half tablet (2.5 mg) with supper      .  prochlorperazine (COMPAZINE) 10 MG tablet Take 10 mg by mouth every 6 (six) hours as needed. Nausea      . sertraline (ZOLOFT) 50 MG tablet Take 50-100 mg by mouth daily. Takes 50 mg one day and 100 mg the next day      . Sodium Chloride Flush 0.9 % SOLN Use as directed for hickman catheter flushes  60 Syringe  3  . sulfamethoxazole-trimethoprim (BACTRIM DS) 800-160 MG per tablet Take 1 tablet by mouth. Takes twice per Timothy [name]      .  tacrolimus (PROGRAF) 1 MG capsule Take 1 mg by mouth 2 (two) times daily. Takes with a 0.5 mg capsule      . antiseptic oral rinse (BIOTENE) LIQD 15 mLs by Mouth Rinse route 2 times daily at 12 noon and 4 pm.  237 mL  6   No current facility-administered medications for this visit.   Facility-Administered Medications Ordered in Other Visits  Medication Dose Route Frequency Provider Last Rate Last Dose  . [COMPLETED] acetaminophen (TYLENOL) tablet 650 mg  650 mg Oral Once Catalina Gravel, PA   650 mg at 10/27/12 1236  . [COMPLETED] pegfilgrastim (NEULASTA) injection 6 mg  6 mg Subcutaneous Once Catalina Gravel, PA   6 mg at 10/27/12 1246  . [EXPIRED] sodium chloride 0.9 % 1,000 mL with magnesium sulfate 1 g infusion   Intravenous Continuous Artelia Game G Lamya Lausch, PA      . sodium chloride 0.9 % injection 10 mL  10 mL Intravenous PRN Lowella Dell, MD   10 mL at 08/11/12 1606    OBJECTIVE: Middle-aged white male who appears alert, comfortable, and in no acute distress Filed Vitals:   10/27/12 1133  BP: 125/73  Pulse: 89  Temp: 97.9 F (36.6 C)  Resp: 20     Body mass index is 28.13 kg/(m^2).    Filed Weights   10/27/12 1133  Weight: 185 lb (83.915 kg)   ECOG FS: 1  Sclerae unicteric  Oropharynx clear with no lesions or thrush No cervical or supraclavicular adenopathy  Lungs clear to auscultation,  no rales or rhonchi  Heart regular rate and rhythm, no murmur appreciated Abd benign, specifically +BS, soft, nontender to palpation  MSK  minimal bilateral ankle edema,  nonpitting. No upper extremity edema noted Neuro: nonfocal, alert and oriented x 3, positive affect,  Skin:  No evident rash    LAB RESULTS:  CMV    Negative  10/21/2012  Negative  10/14/2012  Negative  10/06/2012  Negative  09/29/2012  Negative  09/24/2012  Negative   09/15/2012  Positive, <200  09/08/2012   Lab Results  Component Value Date   WBC 2.6* 10/27/2012   NEUTROABS 0.0* 10/27/2012   HGB 12.3* 10/27/2012   HCT 36.2* 10/27/2012   MCV 93.1 10/27/2012   PLT 140 10/27/2012      Chemistry      Component Value Date/Time   NA 138 10/27/2012 1116   NA 136 08/26/2012 0540   K 4.0 10/27/2012 1116   K 3.7 08/26/2012 0540   CL 107 10/27/2012 1116   CL 107 08/26/2012 0540   CO2 25 10/27/2012 1116   CO2 21 08/26/2012 0540   BUN 15.0 10/27/2012 1116   BUN 8 08/26/2012 0540   CREATININE 1.0 10/27/2012 1116   CREATININE 1.03 08/26/2012 0540      Component Value Date/Time   CALCIUM 9.1 10/27/2012 1116   CALCIUM 8.2* 08/26/2012 0540   ALKPHOS 67 10/27/2012 1116   ALKPHOS 84 08/26/2012 0540   AST 24 10/27/2012 1116   AST 18 08/26/2012 0540   ALT 19 10/27/2012 1116   ALT 7 08/26/2012 0540   BILITOT 0.31 10/27/2012 1116   BILITOT 0.2* 08/26/2012 0540     IgG:  418 10/21/2012  LDH: 193 10/14/2012   Lipid Panel     Component Value Date/Time   CHOL 225* 08/20/2012 1341   TRIG 301* 08/20/2012 1341   HDL 23* 08/20/2012 1341   CHOLHDL 9.8 08/20/2012 1341   VLDL 60* 08/20/2012  1341   LDLCALC 142* 08/20/2012 1341    Magnesium was 2.0 on 10/21/2012.  Results for Timothy [name], Timothy [name] (MRN [id number]) as of 10/14/2012 17:26  Ref. Range 07/21/2012 10:20 07/28/2012 15:22 08/04/2012 14:45 08/06/2012 15:10 08/11/2012 09:17  Tacrolimus Lvl Latest Range: 5.0-20.0 ng/mL 3.9 (L) 9.7 9.0 6.9 6.9    STUDIES:  No recent studies.   ASSESSMENT: 66 y.o.  man with a history of well-differentiated lymphocytic lymphoma/ chronic lymphoid leukemia initially diagnosed in 2000, not requiring intervention until 2006; with  multiple chromosomal abnormalities.  His treatment history is as follows:  (1) fludarabine/cyclophosphamide/rituximab x5 completed May 2007.  (2) rituximab for 8 doses October 2010, with partial response  (3) Leustatin and ofatumumab weekly x8 July to September 2011 followed by maintenance ofatumumab maintenance ofatumumab every 2 months, with initial response but rising counts September 2012  (4) status-post unrelated donor stem-cell transplant 02/24/2012 at the [facility]  (a) conditioning regimen consisted of fludarabine + TBI at 200 cGy, followed by rituximab x27;  (b) CMV reactivation x3 (patient CMV positive, donor negative), s/p ganciclovir treatment; 3d reactivation August 2013, s/p gancyclovir, with negative PCR mid-September 2013; last gancyclovir dose 10/06/2012 (c) GVHD: involving gut and skin, treated with steroids, tacrolimus and MMF  (d) atrial fibrillation: resolved on amiodarone  (e) steroid-induced myopathy: improving  (f) hypomagnesemia: on continuing replacement (g) hypogammaglobulinemia: s/p IVIG most recently 10/01/2012 (h) elevated triglycerides (606 on 07/14/2012)  (i) adrenal insufficiency: on prednisone taper (j) pancytopenia; neupogen now PRN neutrophil count <1800  (5) restaging studies September 2013  including CT scans, flow cytometry, and bone marrow biopsy, showed no evidence of residual chronic lymphoid leukemia.  PLAN: Timothy [name] continues to feel well, despite his afebrile neutropenia. He is already on prophylactic antibiotics, including both Bactrim and Levaquin, and we'll continue his current regimen. I have reviewed his case with Dr. Graciela Husbands today in Dr. Darnelle Catalan absence, and she has recommended initiating Neulasta injections, 6 mg every 2 weeks. He will receive his first injection today as he is scheduled for supportive IV fluids and IV magnesium. We will recheck his CBC on Friday, then again on Monday. I plan on seeing Timothy [name] on November 14 for followup.  We will  continue to check Timothy [name]'s labs weekly. He'll continue to receive weekly IV fluids with IV magnesium through the end of November for supportive measures, and is scheduled to see Dr. Darnelle Catalan in early December to review his long-term plan for followup.  As in the past, we will replace his immunoglobulins if his IgG level drops below 500. She'll stay on his current dose of prednisone (5 mg in the morning and 2.5 mg in the evening) for 3 more weeks (through the end of November) at which time we will reassess. His tacrolimus will continue to be tapered per [place]'s protocol.  Aurther Loft and [place] both voice understanding and agreement with this plan and will call with any changes or problems.  Zollie Scale    10/27/2012

## 2012-10-27 NOTE — Telephone Encounter (Signed)
[name] email to setup patient's fluids for 11-04-2012 11-17-2012 11-10-2012  Made patient appointment for lab only appointments for 11-01-2012 and 11-24-2012

## 2012-10-27 NOTE — Patient Instructions (Addendum)
[facility] Discharge Instructions for Patients Receiving Chemotherapy  Today you received the following IVF with Magnesium, Neulasta and Tylenol  To help prevent nausea and vomiting after your treatment, we encourage you to take your nausea medication as prescribed.    If you develop nausea and vomiting that is not controlled by your nausea medication, call the clinic. If it is after clinic hours your family physician or the after hours number for the clinic or go to the Emergency Department.   BELOW ARE SYMPTOMS THAT SHOULD BE REPORTED IMMEDIATELY:  *FEVER GREATER THAN 100.5 F  *CHILLS WITH OR WITHOUT FEVER  NAUSEA AND VOMITING THAT IS NOT CONTROLLED WITH YOUR NAUSEA MEDICATION  *UNUSUAL SHORTNESS OF BREATH  *UNUSUAL BRUISING OR BLEEDING  TENDERNESS IN MOUTH AND THROAT WITH OR WITHOUT PRESENCE OF ULCERS  *URINARY PROBLEMS  *BOWEL PROBLEMS  UNUSUAL RASH Items with * indicate a potential emergency and should be followed up as soon as possible.  One of the nurses will contact you 24 hours after your treatment. Please let the nurse know about any problems that you may have experienced. Feel free to call the clinic you have any questions or concerns. The clinic phone number is [phone].   I have been informed and understand all the instructions given to me. I know to contact the clinic, my physician, or go to the Emergency Department if any problems should occur. I do not have any questions at this time, but understand that I may call the clinic during office hours   should I have any questions or need assistance in obtaining follow up care.    __________________________________________  _____________  __________ Signature of Patient or Authorized Representative            Date                   Time    __________________________________________ Nurse's Signature

## 2012-10-29 ENCOUNTER — Ambulatory Visit (HOSPITAL_BASED_OUTPATIENT_CLINIC_OR_DEPARTMENT_OTHER): Payer: BC Managed Care – PPO

## 2012-10-29 ENCOUNTER — Other Ambulatory Visit (HOSPITAL_BASED_OUTPATIENT_CLINIC_OR_DEPARTMENT_OTHER): Payer: BC Managed Care – PPO

## 2012-10-29 ENCOUNTER — Other Ambulatory Visit: Payer: Self-pay | Admitting: Oncology

## 2012-10-29 DIAGNOSIS — D61818 Other pancytopenia: Secondary | ICD-10-CM

## 2012-10-29 DIAGNOSIS — C9590 Leukemia, unspecified not having achieved remission: Secondary | ICD-10-CM

## 2012-10-29 DIAGNOSIS — D849 Immunodeficiency, unspecified: Secondary | ICD-10-CM

## 2012-10-29 DIAGNOSIS — C9110 Chronic lymphocytic leukemia of B-cell type not having achieved remission: Secondary | ICD-10-CM

## 2012-10-29 DIAGNOSIS — D89811 Chronic graft-versus-host disease: Secondary | ICD-10-CM

## 2012-10-29 DIAGNOSIS — C9421 Acute megakaryoblastic leukemia, in remission: Secondary | ICD-10-CM

## 2012-10-29 LAB — CBC WITH DIFFERENTIAL/PLATELET
Basophils Absolute: 0 10*3/uL (ref 0.0–0.1)
Eosinophils Absolute: 0 10*3/uL (ref 0.0–0.5)
HCT: 36.3 % — ABNORMAL LOW (ref 38.4–49.9)
HGB: 12.5 g/dL — ABNORMAL LOW (ref 13.0–17.1)
MCV: 95.4 fL (ref 79.3–98.0)
MONO%: 38.4 % — ABNORMAL HIGH (ref 0.0–14.0)
NEUT#: 0.2 10*3/uL — CL (ref 1.5–6.5)
NEUT%: 7 % — ABNORMAL LOW (ref 39.0–75.0)
RDW: 15.2 % — ABNORMAL HIGH (ref 11.0–14.6)
lymph#: 1.7 10*3/uL (ref 0.9–3.3)

## 2012-10-29 MED ORDER — SODIUM CHLORIDE 0.9 % IJ SOLN
10.0000 mL | INTRAMUSCULAR | Status: DC | PRN
  Administered 2012-10-29: 10 mL via INTRAVENOUS
  Filled 2012-10-29: qty 10

## 2012-10-29 MED ORDER — HEPARIN SOD (PORK) LOCK FLUSH 100 UNIT/ML IV SOLN
500.0000 [IU] | Freq: Once | INTRAVENOUS | Status: AC
  Administered 2012-10-29: 250 [IU] via INTRAVENOUS
  Filled 2012-10-29: qty 5

## 2012-10-29 MED ORDER — HEPARIN SOD (PORK) LOCK FLUSH 100 UNIT/ML IV SOLN
500.0000 [IU] | Freq: Once | INTRAVENOUS | Status: AC
Start: 2012-10-29 — End: 2012-10-29
  Administered 2012-10-29: 250 [IU] via INTRAVENOUS
  Filled 2012-10-29: qty 5

## 2012-10-29 NOTE — Patient Instructions (Addendum)
Call MD for problems and keep a check on BP

## 2012-10-29 NOTE — Progress Notes (Signed)
Patient arrived to flush room today for flush and dresisng change.  Right chest Hickman site unremarkable.  BP low today and wife will keep a check on it.  Mena Pauls RN for Dr. Darnelle Catalan talked with patient and wife in lobby and instructions given regarding BP meds.

## 2012-10-30 LAB — IGG, IGA, IGM
IgA: <6 mg/dL — ABNORMAL LOW (ref 68–379)
IgM, Serum: <5 mg/dL — ABNORMAL LOW (ref 41–251)

## 2012-11-01 ENCOUNTER — Other Ambulatory Visit (HOSPITAL_BASED_OUTPATIENT_CLINIC_OR_DEPARTMENT_OTHER): Payer: BC Managed Care – PPO

## 2012-11-01 DIAGNOSIS — D61818 Other pancytopenia: Secondary | ICD-10-CM

## 2012-11-01 DIAGNOSIS — Z9489 Other transplanted organ and tissue status: Secondary | ICD-10-CM

## 2012-11-01 DIAGNOSIS — C9110 Chronic lymphocytic leukemia of B-cell type not having achieved remission: Secondary | ICD-10-CM

## 2012-11-01 DIAGNOSIS — D89811 Chronic graft-versus-host disease: Secondary | ICD-10-CM

## 2012-11-01 DIAGNOSIS — D849 Immunodeficiency, unspecified: Secondary | ICD-10-CM

## 2012-11-01 LAB — CBC WITH DIFFERENTIAL/PLATELET
Basophils Absolute: 0 10*3/uL (ref 0.0–0.1)
Eosinophils Absolute: 0.1 10*3/uL (ref 0.0–0.5)
LYMPH%: 27.8 % (ref 14.0–49.0)
MCH: 31.1 pg (ref 27.2–33.4)
MCV: 91.8 fL (ref 79.3–98.0)
MONO%: 43.6 % — ABNORMAL HIGH (ref 0.0–14.0)
NEUT#: 2.1 10*3/uL (ref 1.5–6.5)
Platelets: 162 10*3/uL (ref 140–400)
RBC: 4.37 10*6/uL (ref 4.20–5.82)

## 2012-11-02 ENCOUNTER — Telehealth: Payer: Self-pay | Admitting: *Deleted

## 2012-11-02 ENCOUNTER — Other Ambulatory Visit: Payer: Self-pay | Admitting: *Deleted

## 2012-11-02 NOTE — Telephone Encounter (Signed)
Per POF I have scheduled appts for this patient.m Books closed for 11/14, desk RN [id number].  JMW

## 2012-11-04 ENCOUNTER — Other Ambulatory Visit (HOSPITAL_BASED_OUTPATIENT_CLINIC_OR_DEPARTMENT_OTHER): Payer: BC Managed Care – PPO

## 2012-11-04 ENCOUNTER — Ambulatory Visit (HOSPITAL_BASED_OUTPATIENT_CLINIC_OR_DEPARTMENT_OTHER): Payer: BC Managed Care – PPO | Admitting: Physician Assistant

## 2012-11-04 ENCOUNTER — Encounter: Payer: Self-pay | Admitting: Physician Assistant

## 2012-11-04 ENCOUNTER — Other Ambulatory Visit: Payer: Self-pay | Admitting: *Deleted

## 2012-11-04 VITALS — BP 150/83 | HR 92 | Temp 98.2°F | Resp 20 | Ht 68.0 in | Wt 180.5 lb

## 2012-11-04 DIAGNOSIS — C9110 Chronic lymphocytic leukemia of B-cell type not having achieved remission: Secondary | ICD-10-CM

## 2012-11-04 DIAGNOSIS — D849 Immunodeficiency, unspecified: Secondary | ICD-10-CM

## 2012-11-04 DIAGNOSIS — D89811 Chronic graft-versus-host disease: Secondary | ICD-10-CM

## 2012-11-04 DIAGNOSIS — R894 Abnormal immunological findings in specimens from other organs, systems and tissues: Secondary | ICD-10-CM

## 2012-11-04 DIAGNOSIS — Z9489 Other transplanted organ and tissue status: Secondary | ICD-10-CM

## 2012-11-04 DIAGNOSIS — D61818 Other pancytopenia: Secondary | ICD-10-CM

## 2012-11-04 DIAGNOSIS — R768 Other specified abnormal immunological findings in serum: Secondary | ICD-10-CM

## 2012-11-04 DIAGNOSIS — C9421 Acute megakaryoblastic leukemia, in remission: Secondary | ICD-10-CM

## 2012-11-04 LAB — CBC WITH DIFFERENTIAL/PLATELET
Basophils Absolute: 0.1 10*3/uL (ref 0.0–0.1)
EOS%: 0.4 % (ref 0.0–7.0)
HCT: 43.6 % (ref 38.4–49.9)
HGB: 14.7 g/dL (ref 13.0–17.1)
LYMPH%: 10.6 % — ABNORMAL LOW (ref 14.0–49.0)
MCH: 31.6 pg (ref 27.2–33.4)
MCV: 93.6 fL (ref 79.3–98.0)
MONO%: 12.6 % (ref 0.0–14.0)
NEUT%: 76.1 % — ABNORMAL HIGH (ref 39.0–75.0)

## 2012-11-04 LAB — COMPREHENSIVE METABOLIC PANEL (CC13)
ALT: 21 U/L (ref 0–55)
AST: 26 U/L (ref 5–34)
BUN: 16 mg/dL (ref 7.0–26.0)
Calcium: 9.2 mg/dL (ref 8.4–10.4)
Chloride: 101 mEq/L (ref 98–107)
Creatinine: 1.2 mg/dL (ref 0.7–1.3)
Total Bilirubin: 0.32 mg/dL (ref 0.20–1.20)

## 2012-11-04 LAB — IGG, IGA, IGM
IgA: <6 mg/dL — ABNORMAL LOW (ref 68–379)
IgM, Serum: <5 mg/dL — ABNORMAL LOW (ref 41–251)

## 2012-11-04 LAB — MAGNESIUM (CC13): Magnesium: 2 mg/dl (ref 1.5–2.5)

## 2012-11-04 MED ORDER — SODIUM CHLORIDE FLUSH 0.9 % IV SOLN: INTRAVENOUS | Status: DC

## 2012-11-04 MED ORDER — HEPARIN SOD (PORK) LOCK FLUSH 100 UNIT/ML IV SOLN: INTRAVENOUS | Status: DC

## 2012-11-04 NOTE — Progress Notes (Signed)
ID: Timothy [name]   DOB: 27-Jan-1946  MR#: [id number]  HQI#:[id number]  HISTORY OF PRESENT ILLNESS: We have very complete records from Dr. Sydnee Levans in [place], and in summary:  The patient was initially diagnosed in August 2000, with a white cell count of 23,600, but normal hemoglobin and platelets, and no significant symptomatology. Over the next several years his white cell count drifted up, and he eventually developed some symptoms of night sweats in particular, leading to treatment with fludarabine, Cytoxan and rituxan for five cycles given between December 2006 and May 2007.  We have CT scans from June 2006, November 2006 and April 2007, and comparing the November 2006 and April 2007 scans, there was near complete response. He had subsequent therapy in [place] as detailed below, but with decreased response, leading to allogeneic stem-cell transplant at the [facility] 02/24/2012.  INTERVAL HISTORY: Timothy [name] returns with his wife Timothy [name]  for followup of his CLL, status post allogeneic stem cell transplant in [place] March 2013.     REVIEW OF SYSTEMS: Timothy [name] is feeling fairly well, although he has had increased fatigue this past week. He continues to walk daily. He's had a little more nausea this past week and his appetite is reduced. He has lost another 5 pounds over the past week, for total of 10 pounds over the last 4 weeks. He denies abdominal pain. He is having regular bowel movements. Timothy [name] denies any fevers, chills, or night sweats. He had no bony pain associated with Neulasta injection last week. He is breathing well with no increased shortness of breath, cough, or phlegm production, and denies any chest pain or palpitations. He has had no abnormal headaches.  He denies pain, and has had no myalgias, arthralgias, or bony pain. He has a "red spot" on his abdomen and one on the back of his right leg he wanted me to look at today. He also has a "sore" on his scalp. These are  not painful and do not itch.  A detailed review of systems is otherwise stable and noncontributory.   PAST MEDICAL HISTORY: Past Medical History  Diagnosis Date  . 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 Diverticular disease   . Hyperlipidemia   . Obesity   . Hypertension   . Hiatal hernia   . CMV (cytomegalovirus) antibody positive     pre-transplant, with seroconversion x2 pst-transplant  . Right bundle branch block     pre-transplant  . CKD (chronic kidney disease) stage 2, GFR 60-89 ml/min   . Pancytopenia   . Steroid-induced diabetes   . Atrial fibrillation     post-transplant  . Myopathy   . 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 Leukemia, chronic lymphoid   . 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 Tonsillectomy and adenoidectomy   . Bone marrow transplant     FAMILY HISTORY Family History  Problem Relation Age of Onset  . Cancer Father    The patient's father died from complications of chronic lymphocytic leukemia at the age of 70.  It had been diagnosed seven years before when he was 65.  The patient is enrolled in a familial chronic lymphocytic leukemia study out of the [facility].  The patient's mother is 38, alive, unfortunately suffering with dementia, and he has a brother, 28, who is otherwise in fair health.   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  His wife of >40 years, Timothy [name], is a homemaker.  Their daughter, Timothy [name], lives in [place].  She also is a Futures trader.  The patient has an 38 year old grandson and an 58-year-old granddaughter, and that is really the main reason he moved to this area.  He is a International aid/development worker.     ADVANCED DIRECTIVES:  HEALTH MAINTENANCE: History  Substance Use Topics  . Smoking status:  Never Smoker   . Smokeless tobacco: Never Used  . Alcohol Use: No     Colonoscopy:  PAP:  Bone density:  Lipid panel:  Allergies  Allergen Reactions  . Benadryl (Diphenhydramine Hcl)     "Restless leg syndrome"    Current Outpatient Prescriptions  Medication Sig Dispense Refill  . acyclovir (ZOVIRAX) 400 MG tablet Take 1 tablet (400 mg total) by mouth 2 (two) times daily.  60 tablet  2  . Alum & Mag Hydroxide-Simeth (MAGIC MOUTHWASH W/LIDOCAINE) SOLN Take 5 mLs by mouth 4 (four) times daily as needed. Mouth sores      . antiseptic oral rinse (BIOTENE) LIQD 15 mLs by Mouth Rinse route 2 times daily at 12 noon and 4 pm.  237 mL  6  . Calcium Citrate (CITRACAL PO) Take 1,200 mg by mouth 2 (two) times daily.      . chlorhexidine (PERIDEX) 0.12 % solution       . cholecalciferol (VITAMIN D) 1000 UNITS tablet Take 1,000 Units by mouth daily.      Marland Kitchen dextrose 5 % SOLN 50 mL with magnesium sulfate 50 % SOLN 1 g Inject 1 g into the vein 3 (three) times a week. Gets this at [facility]      . filgrastim (NEUPOGEN) 480 MCG/1.6ML injection Inject 480 mcg into the skin 3 (three) times a week.       . heparin lock flush 100 UNIT/ML SOLN Use as directed for hickman catheter care  60 Syringe  3  . labetalol (NORMODYNE) 200 MG tablet Take 200 mg by mouth 2 (two) times daily.      Marland Kitchen levofloxacin (LEVAQUIN) 750 MG tablet Take 1 tablet (750 mg total) by mouth daily.  30 tablet  1  . lisinopril (PRINIVIL,ZESTRIL) 10 MG tablet Take 1 tablet (10 mg total) by mouth at bedtime.  30 tablet  2  . LORazepam (ATIVAN) 0.5 MG tablet Take 0.5 mg by mouth every 6 (six) hours as needed. anxiety      . meclizine (ANTIVERT) 25 MG tablet 1/2 - 1 tab PO BID PRN vertigo  30 tablet  0  . Multiple Vitamins-Minerals (CENTRUM SILVER ADULT 50+) TABS Take 1 tablet by mouth daily.      . Omega-3 Fatty Acids (FISH OIL) 300 MG CAPS Take 600 mg by mouth daily.      Marland Kitchen omeprazole (PRILOSEC) 20 MG capsule Take 20 mg by mouth daily.      .  ondansetron (ZOFRAN-ODT) 8 MG disintegrating tablet Take 1 tablet (8 mg total) by mouth 2 (two) times daily.  30 tablet  1  . oxycodone (OXY-IR) 5 MG capsule Take 1 capsule (5 mg total) by mouth every 6 (six) hours as needed. Pain  30 capsule  0  . predniSONE (DELTASONE) 5 MG tablet Take 5 mg by mouth 2 (two) times daily. Take 1 tablet (5mg ) with breakfast, one-half tablet (2.5 mg) with supper      .  prochlorperazine (COMPAZINE) 10 MG tablet Take 10 mg by mouth every 6 (six) hours as needed. Nausea      . sertraline (ZOLOFT) 50 MG tablet Take 50-100 mg by mouth daily. Takes 50 mg one day and 100 mg the next day      . Sodium Chloride Flush 0.9 % SOLN Use as directed for hickman catheter flushes  60 Syringe  3  . sulfamethoxazole-trimethoprim (BACTRIM DS) 800-160 MG per tablet Take 1 tablet by mouth. Takes twice per week      . tacrolimus (PROGRAF) 0.5 MG capsule TAKE 3 CAPSULES BY MOUTH 2 TIMES A DAY  180 capsule  0  . tacrolimus (PROGRAF) 1 MG capsule Take 1 mg by mouth 2 (two) times daily. Takes with a 0.5 mg capsule       No current facility-administered medications for this visit.   Facility-Administered Medications Ordered in Other Visits  Medication Dose Route Frequency Provider Last Rate Last Dose  . sodium chloride 0.9 % injection 10 mL  10 mL Intravenous PRN Lowella Dell, MD   10 mL at 08/11/12 1606    OBJECTIVE: Middle-aged white male who appears alert, comfortable, and in no acute distress Filed Vitals:   11/04/12 1333  BP: 150/83  Pulse: 92  Temp: 98.2 F (36.8 C)  Resp: 20     Body mass index is 27.44 kg/(m^2).    Filed Weights   11/04/12 1333  Weight: 180 lb 8 oz (81.874 kg)   ECOG FS: 1  Sclerae unicteric  Oropharynx clear with no lesions or thrush No cervical or supraclavicular adenopathy  Lungs clear to auscultation,  no rales or rhonchi  Heart regular rate and rhythm, no murmur appreciated Abd benign, specifically +BS, soft, nontender to palpation  MSK   minimal bilateral ankle edema, nonpitting. No upper extremity edema noted Neuro: nonfocal, alert and oriented x 3, positive affect,  Skin:  There is a small crusty lesion on the scalp, measuring less than 1 cm at its largest point. There are 2 flat erythematous areas, both small and less than 1.5 cm in diameter. The largest is on the lower left abdomen. There is a very small area on the back of the right thigh. These are not pustular or vesicular. The skin blanches. The areas are not painful to touch.    LAB RESULTS:  CMV    Negative  10/29/2012  Negative  10/21/2012  Negative  10/14/2012  Negative  10/06/2012  Negative  09/29/2012  Negative  09/24/2012  Negative   09/15/2012  Positive, <200  09/08/2012   Lab Results  Component Value Date   WBC 20.6* 11/04/2012   NEUTROABS 15.7* 11/04/2012   HGB 14.7 11/04/2012   HCT 43.6 11/04/2012   MCV 93.6 11/04/2012   PLT 154 11/04/2012      Chemistry      Component Value Date/Time   NA 133* 11/04/2012 1317   NA 136 08/26/2012 0540   K 3.9 11/04/2012 1317   K 3.7 08/26/2012 0540   CL 101 11/04/2012 1317   CL 107 08/26/2012 0540   CO2 25 11/04/2012 1317   CO2 21 08/26/2012 0540   BUN 16.0 11/04/2012 1317   BUN 8 08/26/2012 0540   CREATININE 1.2 11/04/2012 1317   CREATININE 1.03 08/26/2012 0540      Component Value Date/Time   CALCIUM 9.2 11/04/2012 1317   CALCIUM 8.2* 08/26/2012 0540   ALKPHOS 138 11/04/2012 1317   ALKPHOS 84 08/26/2012 0540   AST  26 11/04/2012 1317   AST 18 08/26/2012 0540   ALT 21 11/04/2012 1317   ALT 7 08/26/2012 0540   BILITOT 0.32 11/04/2012 1317   BILITOT 0.2* 08/26/2012 0540     IgG:  892 10/29/2012  LDH: 123 11/01/2012   Lipid Panel     Component Value Date/Time   CHOL 225* 08/20/2012 1341   TRIG 301* 08/20/2012 1341   HDL 23* 08/20/2012 1341   CHOLHDL 9.8 08/20/2012 1341   VLDL 60* 08/20/2012 1341   LDLCALC 142* 08/20/2012 1341    Magnesium was 2.0 on 11/04/2012.  Results for ASCHER, SCHROEPFER (MRN [id number])  as of 10/14/2012 17:26  Ref. Range 07/21/2012 10:20 07/28/2012 15:22 08/04/2012 14:45 08/06/2012 15:10 08/11/2012 09:17  Tacrolimus Lvl Latest Range: 5.0-20.0 ng/mL 3.9 (L) 9.7 9.0 6.9 6.9    STUDIES:  No recent studies.   ASSESSMENT: 66 y.o. Timothy [name] man with a history of well-differentiated lymphocytic lymphoma/ chronic lymphoid leukemia initially diagnosed in 2000, not requiring intervention until 2006; with multiple chromosomal abnormalities.  His treatment history is as follows:  (1) fludarabine/cyclophosphamide/rituximab x5 completed May 2007.  (2) rituximab for 8 doses October 2010, with partial response  (3) Leustatin and ofatumumab weekly x8 July to September 2011 followed by maintenance ofatumumab maintenance ofatumumab every 2 months, with initial response but rising counts September 2012  (4) status-post unrelated donor stem-cell [facility]/24/2012 at [facility]  (a) conditioning regimen consisted of fludarabine + TBI at 200 cGy, followed by rituximab x27;  (b) CMV reactivation x3 (patient CMV positive, donor negative), s/p ganciclovir treatment; 3d reactivation August 2013, s/p gancyclovir, with negative PCR mid-September 2013; last gancyclovir dose 10/06/2012 (c) GVHD: involving gut and skin, treated with steroids, tacrolimus and MMF  (d) atrial fibrillation: resolved on amiodarone  (e) steroid-induced myopathy: improving  (f) hypomagnesemia: on continuing IV replacement (g) hypogammaglobulinemia: s/p IVIG most recently 10/01/2012 (h) elevated triglycerides (606 on 07/14/2012)  (i) adrenal insufficiency: on prednisone taper (j) pancytopenia; Neulasta now PRN neutrophil count <1800  (5) restaging studies September 2013  including CT scans, flow cytometry, and bone marrow biopsy, showed no evidence of residual chronic lymphoid leukemia.  PLAN: Uziah continues to feel well, and we will continue with our current plan. He'll return tomorrow for supportive IV fluids along with  IV magnesium. He'll continue to receive weekly IV fluids with IV magnesium through the end of November for supportive measures, and is scheduled to see Dr. Darnelle Catalan in early December to review his long-term plan for followup.  As in the past, we will replace his immunoglobulins if his IgG level drops below 500.   He'll stay on his current dose of prednisone (5 mg in the morning and 2.5 mg in the evening) for 3 more weeks (through the end of November) at which time we will reassess. His tacrolimus will continue to be tapered per [place]'s protocol.  We are actually contacting your office to confirm how to taper past November 15.  Dairon will receive Neulasta injections on an every 2 week basis as needed. His next injection would be due next week on November 20, and I will see Tayshawn on the same day for followup.  Onaje will try a little topical hydrocortisone cream on the 2 small skin lesions on the abdomen and right leg, but will let me know if these do not improve. With regards to the skin lesion on his scalp, however, I have recommended he see a dermatologist and they will call and make that appointment on  there own.  Tarrell will continue taking his antinausea medications as needed. We talked about supplementing his diet with boost, Ensure, or instant breakfast drinks. He knows to "make his calories count" if he is only able to eat small meals.  Aurther Loft and [place] both voice understanding and agreement with this plan and will call with any changes or problems.  Zollie Scale    11/04/2012

## 2012-11-05 ENCOUNTER — Ambulatory Visit (HOSPITAL_BASED_OUTPATIENT_CLINIC_OR_DEPARTMENT_OTHER): Payer: BC Managed Care – PPO

## 2012-11-05 VITALS — BP 121/74 | HR 81 | Temp 98.5°F

## 2012-11-05 DIAGNOSIS — D89811 Chronic graft-versus-host disease: Secondary | ICD-10-CM

## 2012-11-05 DIAGNOSIS — C9110 Chronic lymphocytic leukemia of B-cell type not having achieved remission: Secondary | ICD-10-CM

## 2012-11-05 DIAGNOSIS — E860 Dehydration: Secondary | ICD-10-CM

## 2012-11-05 DIAGNOSIS — Z9489 Other transplanted organ and tissue status: Secondary | ICD-10-CM

## 2012-11-05 MED ORDER — SODIUM CHLORIDE 0.9 % IJ SOLN
10.0000 mL | INTRAMUSCULAR | Status: DC | PRN
  Administered 2012-11-05: 10 mL via INTRAVENOUS
  Filled 2012-11-05: qty 10

## 2012-11-05 MED ORDER — HEPARIN SOD (PORK) LOCK FLUSH 100 UNIT/ML IV SOLN
500.0000 [IU] | Freq: Once | INTRAVENOUS | Status: AC
  Administered 2012-11-05: 250 [IU] via INTRAVENOUS
  Filled 2012-11-05: qty 5

## 2012-11-05 MED ORDER — SODIUM CHLORIDE 0.9 % IV SOLN
INTRAVENOUS | Status: DC
  Administered 2012-11-05: 20 mL via INTRAVENOUS

## 2012-11-05 MED ORDER — SODIUM CHLORIDE 0.9 % IV SOLN
INTRAVENOUS | Status: AC
  Administered 2012-11-05: 15:00:00 via INTRAVENOUS
  Filled 2012-11-05: qty 1000

## 2012-11-05 MED ORDER — HEPARIN SOD (PORK) LOCK FLUSH 100 UNIT/ML IV SOLN
500.0000 [IU] | Freq: Once | INTRAVENOUS | Status: AC
  Administered 2012-11-05: 500 [IU] via INTRAVENOUS
  Filled 2012-11-05: qty 5

## 2012-11-05 NOTE — Patient Instructions (Addendum)
[facility] Discharge Instructions for Patients Receiving Magnesium  Today you received IV magnesium  To help prevent nausea and vomiting after your treatment, we encourage you to take your nausea medication as per Dr. Darnelle Catalan.  If you develop nausea and vomiting that is not controlled by your nausea medication, call the clinic. If it is after clinic hours your family physician or the after hours number for the clinic or go to the Emergency Department.   BELOW ARE SYMPTOMS THAT SHOULD BE REPORTED IMMEDIATELY:  *FEVER GREATER THAN 100.5 F  *CHILLS WITH OR WITHOUT FEVER  NAUSEA AND VOMITING THAT IS NOT CONTROLLED WITH YOUR NAUSEA MEDICATION  *UNUSUAL SHORTNESS OF BREATH  *UNUSUAL BRUISING OR BLEEDING  TENDERNESS IN MOUTH AND THROAT WITH OR WITHOUT PRESENCE OF ULCERS  *URINARY PROBLEMS  *BOWEL PROBLEMS  UNUSUAL RASH Items with * indicate a potential emergency and should be followed up as soon as possible.  . Feel free to call the clinic you have any questions or concerns. The clinic phone number is [phone].   I have been informed and understand all the instructions given to me. I know to contact the clinic, my physician, or go to the Emergency Department if any problems should occur. I do not have any questions at this time, but understand that I may call the clinic during office hours   should I have any questions or need assistance in obtaining follow up care.    __________________________________________  _____________  __________ Signature of Patient or Authorized Representative            Date                   Time    __________________________________________ Nurse's Signature

## 2012-11-08 ENCOUNTER — Telehealth: Payer: Self-pay | Admitting: *Deleted

## 2012-11-08 NOTE — Telephone Encounter (Signed)
[place] called to this RN to state onset of diarrhea occurring since Saturday.  Per inquiry he states not incontinent just " quick onset with urgency " -  consistancy is of " mashed potatoes " and is going approximately 2 X a day.  Note pt tapered dose of tacrolimus on Friday to 0.5mg  bid ( was on 1mg  bid ).  Per MD review recommended for Sahil to resume dose at 1mg  bid and to call with bowel update tomorrow.  Discussed above with Aurther Loft who verbalized understanding.

## 2012-11-09 ENCOUNTER — Other Ambulatory Visit: Payer: Self-pay | Admitting: *Deleted

## 2012-11-09 MED ORDER — DEXAMETHASONE 0.5 MG/5ML PO ELIX: ORAL_SOLUTION | ORAL | Status: DC

## 2012-11-10 ENCOUNTER — Ambulatory Visit (HOSPITAL_BASED_OUTPATIENT_CLINIC_OR_DEPARTMENT_OTHER): Payer: BC Managed Care – PPO

## 2012-11-10 ENCOUNTER — Ambulatory Visit (HOSPITAL_BASED_OUTPATIENT_CLINIC_OR_DEPARTMENT_OTHER): Payer: BC Managed Care – PPO | Admitting: Physician Assistant

## 2012-11-10 ENCOUNTER — Other Ambulatory Visit (HOSPITAL_BASED_OUTPATIENT_CLINIC_OR_DEPARTMENT_OTHER): Payer: BC Managed Care – PPO

## 2012-11-10 ENCOUNTER — Encounter: Payer: Self-pay | Admitting: Physician Assistant

## 2012-11-10 VITALS — BP 100/65 | HR 80 | Temp 98.2°F | Resp 20 | Ht 68.0 in | Wt 179.7 lb

## 2012-11-10 DIAGNOSIS — R894 Abnormal immunological findings in specimens from other organs, systems and tissues: Secondary | ICD-10-CM

## 2012-11-10 DIAGNOSIS — Z9489 Other transplanted organ and tissue status: Secondary | ICD-10-CM

## 2012-11-10 DIAGNOSIS — C9110 Chronic lymphocytic leukemia of B-cell type not having achieved remission: Secondary | ICD-10-CM

## 2012-11-10 DIAGNOSIS — D89811 Chronic graft-versus-host disease: Secondary | ICD-10-CM

## 2012-11-10 DIAGNOSIS — R5381 Other malaise: Secondary | ICD-10-CM

## 2012-11-10 DIAGNOSIS — E860 Dehydration: Secondary | ICD-10-CM

## 2012-11-10 DIAGNOSIS — R197 Diarrhea, unspecified: Secondary | ICD-10-CM

## 2012-11-10 DIAGNOSIS — R768 Other specified abnormal immunological findings in serum: Secondary | ICD-10-CM

## 2012-11-10 DIAGNOSIS — R112 Nausea with vomiting, unspecified: Secondary | ICD-10-CM

## 2012-11-10 DIAGNOSIS — D849 Immunodeficiency, unspecified: Secondary | ICD-10-CM

## 2012-11-10 DIAGNOSIS — E291 Testicular hypofunction: Secondary | ICD-10-CM

## 2012-11-10 DIAGNOSIS — C9421 Acute megakaryoblastic leukemia, in remission: Secondary | ICD-10-CM

## 2012-11-10 DIAGNOSIS — D801 Nonfamilial hypogammaglobulinemia: Secondary | ICD-10-CM

## 2012-11-10 DIAGNOSIS — D61818 Other pancytopenia: Secondary | ICD-10-CM

## 2012-11-10 LAB — COMPREHENSIVE METABOLIC PANEL (CC13)
ALT: 39 U/L (ref 0–55)
Alkaline Phosphatase: 67 U/L (ref 40–150)
CO2: 22 mEq/L (ref 22–29)
Potassium: 4.6 mEq/L (ref 3.5–5.1)
Sodium: 131 mEq/L — ABNORMAL LOW (ref 136–145)
Total Bilirubin: 0.35 mg/dL (ref 0.20–1.20)
Total Protein: 5.3 g/dL — ABNORMAL LOW (ref 6.4–8.3)

## 2012-11-10 LAB — CBC WITH DIFFERENTIAL/PLATELET
BASO%: 0.2 % (ref 0.0–2.0)
LYMPH%: 8.7 % — ABNORMAL LOW (ref 14.0–49.0)
MCHC: 33.5 g/dL (ref 32.0–36.0)
MONO#: 0.8 10*3/uL (ref 0.1–0.9)
RBC: 4.5 10*6/uL (ref 4.20–5.82)
WBC: 18.3 10*3/uL — ABNORMAL HIGH (ref 4.0–10.3)
lymph#: 1.6 10*3/uL (ref 0.9–3.3)

## 2012-11-10 LAB — MAGNESIUM (CC13): Magnesium: 2 mg/dl (ref 1.5–2.5)

## 2012-11-10 MED ORDER — SODIUM CHLORIDE 0.9 % IV SOLN
Freq: Once | INTRAVENOUS | Status: AC
  Administered 2012-11-10: 16:00:00 via INTRAVENOUS
  Filled 2012-11-10: qty 1000

## 2012-11-10 NOTE — Progress Notes (Signed)
ID: Timothy [name]   DOB: [birth year]  MR#: [id number]  OZD#:[id number]  HISTORY OF PRESENT ILLNESS: We have very complete records from Dr. Sydnee Levans in [place], and in summary:  The patient was initially diagnosed in August 2000, with a white cell count of 23,600, but normal hemoglobin and platelets, and no significant symptomatology. Over the next several years his white cell count drifted up, and he eventually developed some symptoms of night sweats in particular, leading to treatment with fludarabine, Cytoxan and rituxan for five cycles given between December 2006 and May 2007.  We have CT scans from June 2006, November 2006 and April 2007, and comparing the November 2006 and April 2007 scans, there was near complete response. He had subsequent therapy in [place] as detailed below, but with decreased response, leading to allogeneic stem-cell transplant at the [facility] 02/24/2012.  INTERVAL HISTORY: Jayseon returns with his wife [name]  for followup of his CLL, status post allogeneic stem cell transplant in [place] March 2013.     REVIEW OF SYSTEMS: Abdelrahman is not feeling as well this week as he has the past 2 weeks. He has had more fatigue. He has only walked twice this week, when previously he was walking on a daily basis. His current prednisone dose is 5 mg in the morning, 2.5 mg in the evening, and he has been on that dose for 2 weeks now. He has no appetite. He has lost another half pound since last week. He is having some nausea, but fortunately no emesis.  Bijon decreased his tacrolimus dose last week as instructed to 0.5 mg by mouth twice a day. After only one day at the reduced dose, he began having loose bowel movements. He went back to 1 mg of the tacrolimus twice daily, and the loose bowel movements resolved. His last bowel movement was Monday, 11/08/2012.  There's been no blood or mucus in the stool. His abdomen feels sore and "sensitive" but he denies any abdominal  cramping or pain. He has had no fevers, chills, or night sweats.  He is breathing well with no increased shortness of breath, cough, or phlegm production, and denies any chest pain or palpitations. He has had no abnormal headaches.  He denies pain, and has had no myalgias, arthralgias, or bony pain. He has been using some topical hydrocortisone cream on the "red spots" on his abdomen these have improved.   A detailed review of systems is otherwise stable and noncontributory.   PAST MEDICAL HISTORY: Past Medical History  Diagnosis Date  . 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 Diverticular disease   . Hyperlipidemia   . Obesity   . Hypertension   . Hiatal hernia   . CMV (cytomegalovirus) antibody positive     pre-transplant, with seroconversion x2 pst-transplant  . Right bundle branch block     pre-transplant  . CKD (chronic kidney disease) stage 2, GFR 60-89 ml/min   . Pancytopenia   . Steroid-induced diabetes   . Atrial fibrillation     post-transplant  . Myopathy   . 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 Leukemia, chronic lymphoid   . 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 Tonsillectomy and adenoidectomy   . Bone marrow transplant     FAMILY HISTORY Family History  Problem Relation Age of Onset  . Cancer Father    The patient's father  died from complications of chronic lymphocytic leukemia at the age of 52.  It had been diagnosed seven years before when he was 3.  The patient is enrolled in a familial chronic lymphocytic leukemia study out of the [facility].  The patient's mother is 17, alive, unfortunately suffering with dementia, and he has a brother, 34, who is otherwise in fair health.   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  His wife of >40 years, [name], is a homemaker.  Their daughter, [name], lives in [place].  She also is a Futures trader.  The patient has an 41 year old grandson and an 59-year-old granddaughter, and that is really the main reason he moved to this area.  He is a International aid/development worker.     ADVANCED DIRECTIVES:  HEALTH MAINTENANCE: History  Substance Use Topics  . Smoking status: Never Smoker   . Smokeless tobacco: Never Used  . Alcohol Use: No     Colonoscopy:  PAP:  Bone density:  Lipid panel:  Allergies  Allergen Reactions  . Benadryl (Diphenhydramine Hcl)     "Restless leg syndrome"    Current Outpatient Prescriptions  Medication Sig Dispense Refill  . acyclovir (ZOVIRAX) 400 MG tablet Take 1 tablet (400 mg total) by mouth 2 (two) times daily.  60 tablet  2  . Alum & Mag Hydroxide-Simeth (MAGIC MOUTHWASH W/LIDOCAINE) SOLN Take 5 mLs by mouth 4 (four) times daily as needed. Mouth sores      . antiseptic oral rinse (BIOTENE) LIQD 15 mLs by Mouth Rinse route 2 times daily at 12 noon and 4 pm.  237 mL  6  . Calcium Citrate (CITRACAL PO) Take 1,200 mg by mouth 2 (two) times daily.      . chlorhexidine (PERIDEX) 0.12 % solution       . cholecalciferol (VITAMIN D) 1000 UNITS tablet Take 1,000 Units by mouth daily.      Marland Kitchen dexamethasone 0.5 MG/5ML elixir 5ml swish and spit for mouth sores up to 5 times a day prn  237 mL  3  . dextrose 5 % SOLN 50 mL with magnesium sulfate 50 % SOLN 1 g Inject 1 g into the vein 3 (three) times a week. Gets [facility]      . filgrastim (NEUPOGEN) 480 MCG/1.6ML injection Inject 480 mcg into the skin 3 (three) times a week.       . heparin lock flush 100 UNIT/ML SOLN Use as directed for hickman catheter care  60 Syringe  3  . labetalol (NORMODYNE) 200 MG tablet Take 200 mg by mouth 2 (two) times daily.      Marland Kitchen levofloxacin (LEVAQUIN) 750 MG tablet Take 1 tablet (750 mg total) by mouth daily.  30 tablet  1  . lisinopril (PRINIVIL,ZESTRIL) 10 MG tablet Take 1  tablet (10 mg total) by mouth at bedtime.  30 tablet  2  . LORazepam (ATIVAN) 0.5 MG tablet Take 0.5 mg by mouth every 6 (six) hours as needed. anxiety      . meclizine (ANTIVERT) 25 MG tablet 1/2 - 1 tab PO BID PRN vertigo  30 tablet  0  . Multiple Vitamins-Minerals (CENTRUM SILVER ADULT 50+) TABS Take 1 tablet by mouth daily.      . Omega-3 Fatty Acids (FISH OIL) 300 MG CAPS Take 600 mg by mouth daily.      Marland Kitchen omeprazole (PRILOSEC) 20  MG capsule Take 20 mg by mouth daily.      . ondansetron (ZOFRAN-ODT) 8 MG disintegrating tablet Take 1 tablet (8 mg total) by mouth 2 (two) times daily.  30 tablet  1  . oxycodone (OXY-IR) 5 MG capsule Take 1 capsule (5 mg total) by mouth every 6 (six) hours as needed. Pain  30 capsule  0  . predniSONE (DELTASONE) 5 MG tablet Take 5 mg by mouth 2 (two) times daily. AS OF 11/10/2012 -- 10mg  in AM and 5mg  in PM      . prochlorperazine (COMPAZINE) 10 MG tablet Take 10 mg by mouth every 6 (six) hours as needed. Nausea      . sertraline (ZOLOFT) 50 MG tablet Take 50-100 mg by mouth daily. Takes 50 mg one day and 100 mg the next day      . Sodium Chloride Flush 0.9 % SOLN Use as directed for hickman catheter flushes  60 Syringe  3  . sulfamethoxazole-trimethoprim (BACTRIM DS) 800-160 MG per tablet Take 1 tablet by mouth. Takes twice per week      . tacrolimus (PROGRAF) 1 MG capsule Take 1 mg by mouth 2 (two) times daily. Takes with a 0.5 mg capsule       No current facility-administered medications for this visit.   Facility-Administered Medications Ordered in Other Visits  Medication Dose Route Frequency Provider Last Rate Last Dose  . sodium chloride 0.9 % injection 10 mL  10 mL Intravenous PRN Lowella Dell, MD   10 mL at 08/11/12 1606    OBJECTIVE: Middle-aged white male who appears fatigued but comfortable, and in no acute distress Filed Vitals:   11/10/12 1446  BP: 100/65  Pulse: 80  Temp: 98.2 F (36.8 C)  Resp: 20     Body mass index is 27.32  kg/(m^2).    Filed Weights   11/10/12 1446  Weight: 179 lb 11.2 oz (81.511 kg)   ECOG FS: 2  Sclerae unicteric  Oropharynx clear with no lesions or thrush No cervical or supraclavicular adenopathy  Lungs clear to auscultation,  no rales or rhonchi  Heart regular rate and rhythm, no murmur appreciated Abd  +BS but hypoactive, soft, slightly tender to palpation diffusely, no guarding or rebound  MSK  minimal bilateral ankle edema, nonpitting. No upper extremity edema noted Neuro: nonfocal, alert and oriented x 3, positive affect,    LAB RESULTS:  CMV   Negative  11/04/2012   Negative  10/29/2012  Negative  10/21/2012  Negative  10/14/2012  Negative  10/06/2012  Negative  09/29/2012  Negative  09/24/2012  Negative   09/15/2012  Positive, <200  09/08/2012   Lab Results  Component Value Date   WBC 18.3* 11/10/2012   NEUTROABS 15.9* 11/10/2012   HGB 14.1 11/10/2012   HCT 42.1 11/10/2012   MCV 93.6 11/10/2012   PLT 122* 11/10/2012      Chemistry      Component Value Date/Time   NA 133* 11/04/2012 1317   NA 136 08/26/2012 0540   K 3.9 11/04/2012 1317   K 3.7 08/26/2012 0540   CL 101 11/04/2012 1317   CL 107 08/26/2012 0540   CO2 25 11/04/2012 1317   CO2 21 08/26/2012 0540   BUN 16.0 11/04/2012 1317   BUN 8 08/26/2012 0540   CREATININE 1.2 11/04/2012 1317   CREATININE 1.03 08/26/2012 0540      Component Value Date/Time   CALCIUM 9.2 11/04/2012 1317   CALCIUM 8.2* 08/26/2012 0540  ALKPHOS 138 11/04/2012 1317   ALKPHOS 84 08/26/2012 0540   AST 26 11/04/2012 1317   AST 18 08/26/2012 0540   ALT 21 11/04/2012 1317   ALT 7 08/26/2012 0540   BILITOT 0.32 11/04/2012 1317   BILITOT 0.2* 08/26/2012 0540     IgG:  816 11/04/2012  LDH: 123 11/01/2012   Lipid Panel     Component Value Date/Time   CHOL 225* 08/20/2012 1341   TRIG 301* 08/20/2012 1341   HDL 23* 08/20/2012 1341   CHOLHDL 9.8 08/20/2012 1341   VLDL 60* 08/20/2012 1341   LDLCALC 142* 08/20/2012 1341    Magnesium was  2.0 on 11/04/2012.  Results for GEORGIO, GO (MRN [id number]) as of 10/14/2012 17:26  Ref. Range 07/21/2012 10:20 07/28/2012 15:22 08/04/2012 14:45 08/06/2012 15:10 08/11/2012 09:17  Tacrolimus Lvl Latest Range: 5.0-20.0 ng/mL 3.9 (L) 9.7 9.0 6.9 6.9    STUDIES:  No recent studies.   ASSESSMENT: 66 y.o. [place] man with a history of well-differentiated lymphocytic lymphoma/ chronic lymphoid leukemia initially diagnosed in 2000, not requiring intervention until 2006; with multiple chromosomal abnormalities.  His treatment history is as follows:  (1) fludarabine/cyclophosphamide/rituximab x5 completed May 2007.  (2) rituximab for 8 doses October 2010, with partial response  (3) Leustatin and ofatumumab weekly x8 July to September 2011 followed by maintenance ofatumumab maintenance ofatumumab every 2 months, with initial response but rising counts September 2012  (4) status-post unrelated donor stem-cell transplant 02/24/2012 at [facility]  (a) conditioning regimen consisted of fludarabine + TBI at 200 cGy, followed by rituximab x27;  (b) CMV reactivation x3 (patient CMV positive, donor negative), s/p ganciclovir treatment; 3d reactivation August 2013, s/p gancyclovir, with negative PCR mid-September 2013; last gancyclovir dose 10/06/2012 (c) GVHD: involving gut and skin, treated with steroids, tacrolimus and MMF  (d) atrial fibrillation: resolved on amiodarone  (e) steroid-induced myopathy: improving  (f) hypomagnesemia: on continuing IV replacement (g) hypogammaglobulinemia: s/p IVIG most recently 10/01/2012 (h) elevated triglycerides (606 on 07/14/2012)  (i) adrenal insufficiency: on prednisone taper (j) pancytopenia; Neulasta now PRN neutrophil count <1800  (5) restaging studies September 2013  including CT scans, flow cytometry, and bone marrow biopsy, showed no evidence of residual chronic lymphoid leukemia.  PLAN: Ashtan will receive his supportive IV fluids today along with IV  magnesium. We will hold his Neulasta injection today with an ANC of 15.9.  I have reviewed this case with Dr. Darnelle Catalan. He has recommended that Aurther Loft increase his prednisone dose back up to 10 mg in the morning, 5 mg in the evening for at least the next week. Boe is on a scheduled to come in for labs and IV fluids next Wednesday, November 27. We will add a followup appointment with Dr. Darnelle Catalan to see him in the infusion room, assess his condition, and consider a prednisone taper. In the meanwhile, he will continue on his current tacrolimus dose of 1 mg twice daily, and will not yet taper. Our plan had been to discontinue Jarry's supportive IV fluids/IV magnesium after his appointment on November 27, but we will reassess his condition next week and add IV infusion appointments into December if necessary. These have not yet been scheduled.  As in the past, we will replace his immunoglobulins if his IgG level drops below 500.   Kaj will let us know if he has not had a bowel movement but tomorrow afternoon. He will continue taking his antinausea medications as needed. We talked about supplementing his diet with boost, Ensure, or  instant breakfast drinks. He knows to "make his calories count" if he is only able to eat small meals, and we will continue to follow his weight closely.    Aurther Loft and Biehle both voice understanding and agreement with this plan and will call with any changes or problems.  Zollie Scale    11/10/2012

## 2012-11-11 LAB — IGG, IGA, IGM
IgG (Immunoglobin G), Serum: 617 mg/dL — ABNORMAL LOW (ref 650–1600)
IgM, Serum: <5 mg/dL — ABNORMAL LOW (ref 41–251)

## 2012-11-12 ENCOUNTER — Ambulatory Visit (HOSPITAL_BASED_OUTPATIENT_CLINIC_OR_DEPARTMENT_OTHER): Payer: BC Managed Care – PPO

## 2012-11-12 ENCOUNTER — Telehealth: Payer: Self-pay | Admitting: *Deleted

## 2012-11-12 ENCOUNTER — Other Ambulatory Visit: Payer: Self-pay | Admitting: Emergency Medicine

## 2012-11-12 VITALS — BP 107/68 | HR 81 | Temp 98.0°F | Resp 20

## 2012-11-12 DIAGNOSIS — C9110 Chronic lymphocytic leukemia of B-cell type not having achieved remission: Secondary | ICD-10-CM

## 2012-11-12 DIAGNOSIS — R197 Diarrhea, unspecified: Secondary | ICD-10-CM

## 2012-11-12 DIAGNOSIS — E860 Dehydration: Secondary | ICD-10-CM

## 2012-11-12 MED ORDER — SODIUM CHLORIDE 0.9 % IV SOLN
Freq: Once | INTRAVENOUS | Status: AC
  Administered 2012-11-12: 15:00:00 via INTRAVENOUS
  Filled 2012-11-12: qty 1000

## 2012-11-12 MED ORDER — DIPHENOXYLATE-ATROPINE 2.5-0.025 MG PO TABS: ORAL_TABLET | ORAL | Status: DC

## 2012-11-12 NOTE — Progress Notes (Signed)
Pt in office with wife. Worked in for IV Fluids and potassium, reports 6 loose stools in the past 24 hours. Afebrile.

## 2012-11-12 NOTE — Telephone Encounter (Addendum)
Pt. Called. He was having some constipation and took a dose of miralax on Wed. Evening.   Thursday evening around 6-7pm  he started with diarrhea and had it 3 times with the last being around 4am this am.  He describes it as being "horrific"- watery, uncontrollable and unpredictable.  He denies any blood with this, but has had mild cramping.  He is not sure if he should take a dose of lomotil.  He did see Zollie Scale on Wed. And continues on his tacrolimus 1mg  BID.  Will discuss with Dr. Darnelle Catalan. Per Dr. Darnelle Catalan: Have patient come in for IVF today - one liter of NS with 20 meq of K+.  It is ok to use lomotil, if needed.  Pt. Will come in at 2:30 today.

## 2012-11-12 NOTE — Telephone Encounter (Signed)
Patient here for IVF with K+ for diarrhea.  Pt. Questions if this might be related to the GVH disease.  Yes, per Dr, Darnelle Catalan.  Pt. Has had 2 more episodes, but they were not nearly as bad as before.  He can use immodium or lomit for diarrhea.  He has used immodium in the past with good results and he would prefer to try that. He does not want to get constipated again.  Since it is the weekend, we will call some Lomotil in for him at [facility] and then he can pick it up if he needs it.  He is very appreciative of our efforts.

## 2012-11-15 ENCOUNTER — Ambulatory Visit (HOSPITAL_BASED_OUTPATIENT_CLINIC_OR_DEPARTMENT_OTHER): Payer: BC Managed Care – PPO

## 2012-11-15 ENCOUNTER — Other Ambulatory Visit: Payer: Self-pay | Admitting: Physician Assistant

## 2012-11-15 VITALS — BP 100/56 | HR 73 | Temp 98.3°F | Resp 20

## 2012-11-15 DIAGNOSIS — E860 Dehydration: Secondary | ICD-10-CM

## 2012-11-15 MED ORDER — SODIUM CHLORIDE 0.9 % IV SOLN
1000.0000 mL | Freq: Once | INTRAVENOUS | Status: AC
Start: 2012-11-15 — End: 2012-11-15
  Administered 2012-11-15: 1000 mL via INTRAVENOUS

## 2012-11-15 NOTE — Patient Instructions (Addendum)
[facility] Discharge Instructions for Patients Receiving FLUIDS  Today you received IV  FLUIDS. To help prevent nausea and vomiting after your treatment, we encourage you to take your nausea medication AS DIRECTED   If you develop nausea and vomiting that is not controlled by your nausea medication, call the clinic. If it is after clinic hours your family physician or the after hours number for the clinic or go to the Emergency Department.   BELOW ARE SYMPTOMS THAT SHOULD BE REPORTED IMMEDIATELY:  *FEVER GREATER THAN 100.5 F  *CHILLS WITH OR WITHOUT FEVER  NAUSEA AND VOMITING THAT IS NOT CONTROLLED WITH YOUR NAUSEA MEDICATION  *UNUSUAL SHORTNESS OF BREATH  *UNUSUAL BRUISING OR BLEEDING  TENDERNESS IN MOUTH AND THROAT WITH OR WITHOUT PRESENCE OF ULCERS  *URINARY PROBLEMS  *BOWEL PROBLEMS  UNUSUAL RASH Items with * indicate a potential emergency and should be followed up as soon as possible.  One of the nurses will contact you 24 hours after your treatment. Please let the nurse know about any problems that you may have experienced. Feel free to call the clinic you have any questions or concerns. The clinic phone number is [phone].   I have been informed and understand all the instructions given to me. I know to contact the clinic, my physician, or go to the Emergency Department if any problems should occur. I do not have any questions at this time, but understand that I may call the clinic during office hours   should I have any questions or need assistance in obtaining follow up care.    __________________________________________  _____________  __________ Signature of Patient or Authorized Representative            Date                   Time    __________________________________________ Nurse's Signature

## 2012-11-16 ENCOUNTER — Inpatient Hospital Stay (HOSPITAL_COMMUNITY)
Admission: AD | Admit: 2012-11-16 | Discharge: 2012-12-03 | DRG: 574 | Disposition: A | Discharge status: Home / Self Care | Payer: BC Managed Care – PPO | Source: Ambulatory Visit | Attending: Oncology | Admitting: Oncology

## 2012-11-16 ENCOUNTER — Other Ambulatory Visit: Payer: Self-pay

## 2012-11-16 ENCOUNTER — Other Ambulatory Visit: Payer: Self-pay | Admitting: Gastroenterology

## 2012-11-16 ENCOUNTER — Other Ambulatory Visit: Payer: Self-pay | Admitting: Oncology

## 2012-11-16 ENCOUNTER — Inpatient Hospital Stay (HOSPITAL_COMMUNITY): Payer: BC Managed Care – PPO

## 2012-11-16 ENCOUNTER — Encounter (HOSPITAL_COMMUNITY): Payer: Self-pay | Admitting: General Practice

## 2012-11-16 ENCOUNTER — Ambulatory Visit (HOSPITAL_BASED_OUTPATIENT_CLINIC_OR_DEPARTMENT_OTHER): Payer: BC Managed Care – PPO

## 2012-11-16 DIAGNOSIS — C9110 Chronic lymphocytic leukemia of B-cell type not having achieved remission: Secondary | ICD-10-CM

## 2012-11-16 DIAGNOSIS — I4891 Unspecified atrial fibrillation: Secondary | ICD-10-CM | POA: Diagnosis present

## 2012-11-16 DIAGNOSIS — I129 Hypertensive chronic kidney disease with stage 1 through stage 4 chronic kidney disease, or unspecified chronic kidney disease: Secondary | ICD-10-CM | POA: Diagnosis present

## 2012-11-16 DIAGNOSIS — K208 Other esophagitis without bleeding: Secondary | ICD-10-CM | POA: Diagnosis present

## 2012-11-16 DIAGNOSIS — R112 Nausea with vomiting, unspecified: Secondary | ICD-10-CM

## 2012-11-16 DIAGNOSIS — K648 Other hemorrhoids: Secondary | ICD-10-CM | POA: Diagnosis present

## 2012-11-16 DIAGNOSIS — R110 Nausea: Secondary | ICD-10-CM

## 2012-11-16 DIAGNOSIS — R5383 Other fatigue: Secondary | ICD-10-CM

## 2012-11-16 DIAGNOSIS — D61818 Other pancytopenia: Secondary | ICD-10-CM | POA: Diagnosis present

## 2012-11-16 DIAGNOSIS — E860 Dehydration: Secondary | ICD-10-CM

## 2012-11-16 DIAGNOSIS — E291 Testicular hypofunction: Secondary | ICD-10-CM | POA: Diagnosis present

## 2012-11-16 DIAGNOSIS — R894 Abnormal immunological findings in specimens from other organs, systems and tissues: Secondary | ICD-10-CM

## 2012-11-16 DIAGNOSIS — R197 Diarrhea, unspecified: Secondary | ICD-10-CM

## 2012-11-16 DIAGNOSIS — I10 Essential (primary) hypertension: Secondary | ICD-10-CM

## 2012-11-16 DIAGNOSIS — T380X5A Adverse effect of glucocorticoids and synthetic analogues, initial encounter: Secondary | ICD-10-CM | POA: Diagnosis present

## 2012-11-16 DIAGNOSIS — G722 Myopathy due to other toxic agents: Secondary | ICD-10-CM | POA: Diagnosis present

## 2012-11-16 DIAGNOSIS — I4510 Unspecified right bundle-branch block: Secondary | ICD-10-CM

## 2012-11-16 DIAGNOSIS — Z9489 Other transplanted organ and tissue status: Secondary | ICD-10-CM

## 2012-11-16 DIAGNOSIS — K5790 Diverticulosis of intestine, part unspecified, without perforation or abscess without bleeding: Secondary | ICD-10-CM

## 2012-11-16 DIAGNOSIS — R5381 Other malaise: Secondary | ICD-10-CM

## 2012-11-16 DIAGNOSIS — Z79899 Other long term (current) drug therapy: Secondary | ICD-10-CM

## 2012-11-16 DIAGNOSIS — G729 Myopathy, unspecified: Secondary | ICD-10-CM

## 2012-11-16 DIAGNOSIS — E099 Drug or chemical induced diabetes mellitus without complications: Secondary | ICD-10-CM

## 2012-11-16 DIAGNOSIS — Z6828 Body mass index (BMI) 28.0-28.9, adult: Secondary | ICD-10-CM

## 2012-11-16 DIAGNOSIS — E669 Obesity, unspecified: Secondary | ICD-10-CM

## 2012-11-16 DIAGNOSIS — IMO0002 Reserved for concepts with insufficient information to code with codable children: Secondary | ICD-10-CM

## 2012-11-16 DIAGNOSIS — R768 Other specified abnormal immunological findings in serum: Secondary | ICD-10-CM

## 2012-11-16 DIAGNOSIS — Z856 Personal history of leukemia: Secondary | ICD-10-CM

## 2012-11-16 DIAGNOSIS — T8600 Unspecified complication of bone marrow transplant: Principal | ICD-10-CM | POA: Diagnosis present

## 2012-11-16 DIAGNOSIS — K449 Diaphragmatic hernia without obstruction or gangrene: Secondary | ICD-10-CM

## 2012-11-16 DIAGNOSIS — D849 Immunodeficiency, unspecified: Secondary | ICD-10-CM

## 2012-11-16 DIAGNOSIS — D89811 Chronic graft-versus-host disease: Secondary | ICD-10-CM | POA: Diagnosis present

## 2012-11-16 DIAGNOSIS — E43 Unspecified severe protein-calorie malnutrition: Secondary | ICD-10-CM | POA: Diagnosis present

## 2012-11-16 DIAGNOSIS — E2749 Other adrenocortical insufficiency: Secondary | ICD-10-CM | POA: Diagnosis present

## 2012-11-16 DIAGNOSIS — R42 Dizziness and giddiness: Secondary | ICD-10-CM

## 2012-11-16 DIAGNOSIS — Y830 Surgical operation with transplant of whole organ as the cause of abnormal reaction of the patient, or of later complication, without mention of misadventure at the time of the procedure: Secondary | ICD-10-CM | POA: Diagnosis present

## 2012-11-16 DIAGNOSIS — E785 Hyperlipidemia, unspecified: Secondary | ICD-10-CM

## 2012-11-16 DIAGNOSIS — G252 Other specified forms of tremor: Secondary | ICD-10-CM

## 2012-11-16 DIAGNOSIS — N182 Chronic kidney disease, stage 2 (mild): Secondary | ICD-10-CM | POA: Diagnosis present

## 2012-11-16 LAB — COMPREHENSIVE METABOLIC PANEL
ALT: 30 U/L (ref 0–53)
Albumin: 2.4 g/dL — ABNORMAL LOW (ref 3.5–5.2)
Alkaline Phosphatase: 44 U/L (ref 39–117)
BUN: 23 mg/dL (ref 6–23)
Calcium: 8.1 mg/dL — ABNORMAL LOW (ref 8.4–10.5)
GFR calc Af Amer: >90 mL/min (ref >90)
Glucose, Bld: 83 mg/dL (ref 70–99)
Potassium: 3.7 mEq/L (ref 3.5–5.1)
Sodium: 132 mEq/L — ABNORMAL LOW (ref 135–145)
Total Protein: 4.4 g/dL — ABNORMAL LOW (ref 6.0–8.3)

## 2012-11-16 LAB — URINALYSIS, ROUTINE W REFLEX MICROSCOPIC
Hgb urine dipstick: NEGATIVE
Leukocytes, UA: NEGATIVE
Nitrite: NEGATIVE
Protein, ur: NEGATIVE mg/dL
Specific Gravity, Urine: 1.025 (ref 1.005–1.030)
Urobilinogen, UA: 0.2 mg/dL (ref 0.0–1.0)

## 2012-11-16 LAB — CBC WITH DIFFERENTIAL/PLATELET
Eosinophils Absolute: 0 10*3/uL (ref 0.0–0.7)
Hemoglobin: 13.3 g/dL (ref 13.0–17.0)
Lymphocytes Relative: 22 % (ref 12–46)
Lymphs Abs: 1.7 10*3/uL (ref 0.7–4.0)
MCH: 30.7 pg (ref 26.0–34.0)
Monocytes Relative: 14 % — ABNORMAL HIGH (ref 3–12)
Neutrophils Relative %: 63 % (ref 43–77)
Platelets: 130 10*3/uL — ABNORMAL LOW (ref 150–400)
RBC: 4.33 MIL/uL (ref 4.22–5.81)
WBC: 7.6 10*3/uL (ref 4.0–10.5)

## 2012-11-16 LAB — SEDIMENTATION RATE: Sed Rate: 0 mm/hr (ref 0–16)

## 2012-11-16 LAB — PROTIME-INR
INR: 1.11 (ref 0.00–1.49)
Prothrombin Time: 14.2 seconds (ref 11.6–15.2)

## 2012-11-16 LAB — APTT: aPTT: 41 seconds — ABNORMAL HIGH (ref 24–37)

## 2012-11-16 LAB — MAGNESIUM: Magnesium: 1.5 mg/dL (ref 1.5–2.5)

## 2012-11-16 MED ORDER — SULFAMETHOXAZOLE-TMP DS 800-160 MG PO TABS: 1.0000 | ORAL_TABLET | Freq: Once | ORAL | Status: DC

## 2012-11-16 MED ORDER — ACETAMINOPHEN 650 MG RE SUPP: 650.0000 mg | Freq: Four times a day (QID) | RECTAL | Status: DC | PRN

## 2012-11-16 MED ORDER — PREDNISONE 10 MG PO TABS
15.0000 mg | ORAL_TABLET | Freq: Every day | ORAL | Status: DC
  Administered 2012-11-17: 15 mg via ORAL
  Filled 2012-11-16 (×3): qty 1

## 2012-11-16 MED ORDER — SODIUM CHLORIDE 0.9 % IV SOLN: INTRAVENOUS | Status: DC

## 2012-11-16 MED ORDER — PANTOPRAZOLE SODIUM 40 MG PO TBEC
40.0000 mg | DELAYED_RELEASE_TABLET | Freq: Every day | ORAL | Status: DC
  Administered 2012-11-16 – 2012-11-17 (×2): 40 mg via ORAL
  Filled 2012-11-16 (×3): qty 1

## 2012-11-16 MED ORDER — CENTRUM SILVER ADULT 50+ PO TABS: 1.0000 | ORAL_TABLET | Freq: Every day | ORAL | Status: DC

## 2012-11-16 MED ORDER — POTASSIUM CHLORIDE IN NACL 20-0.9 MEQ/L-% IV SOLN
INTRAVENOUS | Status: DC
  Administered 2012-11-16 – 2012-11-18 (×4): via INTRAVENOUS
  Filled 2012-11-16 (×5): qty 1000

## 2012-11-16 MED ORDER — MECLIZINE HCL 25 MG PO TABS
25.0000 mg | ORAL_TABLET | Freq: Two times a day (BID) | ORAL | Status: DC | PRN
  Filled 2012-11-16: qty 1

## 2012-11-16 MED ORDER — SODIUM CHLORIDE 0.9 % IV SOLN
1000.0000 mL | Freq: Once | INTRAVENOUS | Status: AC
  Administered 2012-11-16: 1000 mL via INTRAVENOUS

## 2012-11-16 MED ORDER — MAGNESIUM SULFATE IN D5W 10-5 MG/ML-% IV SOLN
1.0000 g | Freq: Once | INTRAVENOUS | Status: AC
  Administered 2012-11-16: 1 g via INTRAVENOUS
  Filled 2012-11-16: qty 100

## 2012-11-16 MED ORDER — PROCHLORPERAZINE MALEATE 10 MG PO TABS
10.0000 mg | ORAL_TABLET | Freq: Four times a day (QID) | ORAL | Status: DC | PRN
  Administered 2012-11-22: 5 mg via ORAL

## 2012-11-16 MED ORDER — OXYCODONE HCL 5 MG PO CAPS: 5.0000 mg | ORAL_CAPSULE | Freq: Four times a day (QID) | ORAL | Status: DC | PRN

## 2012-11-16 MED ORDER — LABETALOL HCL 200 MG PO TABS
200.0000 mg | ORAL_TABLET | Freq: Two times a day (BID) | ORAL | Status: DC
  Administered 2012-11-16 – 2012-12-03 (×33): 200 mg via ORAL
  Filled 2012-11-16 (×35): qty 1

## 2012-11-16 MED ORDER — ONDANSETRON HCL 4 MG/2ML IJ SOLN: 4.0000 mg | Freq: Three times a day (TID) | INTRAMUSCULAR | Status: DC

## 2012-11-16 MED ORDER — ENOXAPARIN SODIUM 40 MG/0.4ML ~~LOC~~ SOLN
40.0000 mg | SUBCUTANEOUS | Status: DC
  Administered 2012-11-16 – 2012-11-24 (×9): 40 mg via SUBCUTANEOUS
  Filled 2012-11-16 (×10): qty 0.4

## 2012-11-16 MED ORDER — ADULT MULTIVITAMIN W/MINERALS CH
1.0000 | ORAL_TABLET | Freq: Every day | ORAL | Status: DC
  Administered 2012-11-16 – 2012-12-03 (×18): 1 via ORAL
  Filled 2012-11-16 (×18): qty 1

## 2012-11-16 MED ORDER — SERTRALINE HCL 100 MG PO TABS
100.0000 mg | ORAL_TABLET | ORAL | Status: DC
  Administered 2012-11-17 – 2012-12-03 (×9): 100 mg via ORAL
  Filled 2012-11-16 (×9): qty 1

## 2012-11-16 MED ORDER — SERTRALINE HCL 50 MG PO TABS: 50.0000 mg | ORAL_TABLET | Freq: Every day | ORAL | Status: DC

## 2012-11-16 MED ORDER — TACROLIMUS 5 MG/ML IV SOLN
Freq: Two times a day (BID) | INTRAVENOUS | Status: DC
  Administered 2012-11-16 – 2012-11-18 (×4): via INTRAVENOUS
  Filled 2012-11-16 (×7): qty 250

## 2012-11-16 MED ORDER — SERTRALINE HCL 50 MG PO TABS
50.0000 mg | ORAL_TABLET | ORAL | Status: DC
  Administered 2012-11-16 – 2012-12-02 (×9): 50 mg via ORAL
  Filled 2012-11-16 (×9): qty 1

## 2012-11-16 MED ORDER — LISINOPRIL 10 MG PO TABS
10.0000 mg | ORAL_TABLET | Freq: Every day | ORAL | Status: DC
  Administered 2012-11-16 – 2012-12-02 (×17): 10 mg via ORAL
  Filled 2012-11-16 (×18): qty 1

## 2012-11-16 MED ORDER — LEVOFLOXACIN 500 MG PO TABS
500.0000 mg | ORAL_TABLET | Freq: Every day | ORAL | Status: DC
  Administered 2012-11-16 – 2012-11-17 (×2): 500 mg via ORAL
  Filled 2012-11-16 (×3): qty 1

## 2012-11-16 MED ORDER — DIPHENOXYLATE-ATROPINE 2.5-0.025 MG PO TABS
1.0000 | ORAL_TABLET | Freq: Four times a day (QID) | ORAL | Status: DC | PRN
  Administered 2012-11-22 – 2012-11-25 (×7): 1 via ORAL
  Administered 2012-11-25: 2 via ORAL
  Administered 2012-11-25: 1 via ORAL
  Administered 2012-11-27: 2 via ORAL
  Administered 2012-11-28 – 2012-11-29 (×2): 1 via ORAL
  Administered 2012-12-01: 2 via ORAL
  Administered 2012-12-02 – 2012-12-03 (×3): 1 via ORAL
  Filled 2012-11-16 (×15): qty 1
  Filled 2012-11-16: qty 2
  Filled 2012-11-16 (×2): qty 1

## 2012-11-16 MED ORDER — LORAZEPAM 0.5 MG PO TABS: 0.5000 mg | ORAL_TABLET | Freq: Four times a day (QID) | ORAL | Status: DC | PRN

## 2012-11-16 MED ORDER — ONDANSETRON 8 MG PO TBDP
8.0000 mg | ORAL_TABLET | Freq: Two times a day (BID) | ORAL | Status: DC
  Administered 2012-11-16 – 2012-11-17 (×3): 8 mg via ORAL
  Filled 2012-11-16 (×5): qty 1

## 2012-11-16 MED ORDER — SULFAMETHOXAZOLE-TMP DS 800-160 MG PO TABS
1.0000 | ORAL_TABLET | ORAL | Status: DC
  Administered 2012-11-16: 1 via ORAL
  Filled 2012-11-16: qty 1

## 2012-11-16 MED ORDER — TACROLIMUS 5 MG/ML IV SOLN: 0.0300 mg/kg/d | INTRAVENOUS | Status: DC

## 2012-11-16 MED ORDER — ACETAMINOPHEN 325 MG PO TABS
650.0000 mg | ORAL_TABLET | Freq: Four times a day (QID) | ORAL | Status: DC | PRN
  Administered 2012-11-16 – 2012-11-18 (×2): 650 mg via ORAL
  Filled 2012-11-16 (×2): qty 2

## 2012-11-16 MED ORDER — ACYCLOVIR 400 MG PO TABS
400.0000 mg | ORAL_TABLET | Freq: Two times a day (BID) | ORAL | Status: DC
  Administered 2012-11-16 – 2012-11-17 (×3): 400 mg via ORAL
  Filled 2012-11-16 (×5): qty 1

## 2012-11-16 MED ORDER — BIOTENE DRY MOUTH MT LIQD
15.0000 mL | Freq: Two times a day (BID) | OROMUCOSAL | Status: DC
  Administered 2012-11-16 – 2012-12-03 (×34): 15 mL via OROMUCOSAL

## 2012-11-16 MED ORDER — ZOLPIDEM TARTRATE 5 MG PO TABS: 5.0000 mg | ORAL_TABLET | Freq: Every evening | ORAL | Status: DC | PRN

## 2012-11-16 MED ORDER — OXYCODONE HCL 5 MG PO TABS: 5.0000 mg | ORAL_TABLET | Freq: Four times a day (QID) | ORAL | Status: DC | PRN

## 2012-11-16 MED ORDER — PREDNISONE 10 MG PO TABS: 10.0000 mg | ORAL_TABLET | Freq: Every day | ORAL | Status: DC

## 2012-11-16 MED ORDER — CHLORHEXIDINE GLUCONATE 0.12 % MT SOLN
10.0000 mL | Freq: Two times a day (BID) | OROMUCOSAL | Status: DC | PRN
  Administered 2012-11-17: 10 mL via OROMUCOSAL
  Filled 2012-11-16: qty 15

## 2012-11-16 MED ORDER — FLUCONAZOLE 100MG IVPB
100.0000 mg | INTRAVENOUS | Status: DC
  Administered 2012-11-16 – 2012-11-24 (×10): 100 mg via INTRAVENOUS
  Filled 2012-11-16 (×9): qty 50

## 2012-11-16 NOTE — H&P (Signed)
PCP: Kari Baars, MD OTHER MD: attn Dr Collier Salina, fax [phone]; Dr Cindra Eves  INTERVAL HISTORY: Timothy [name] was seen in the office November 15, 2012  for followup of his CLL, status post allogeneic stem cell transplant in [facility] March 2013. He reported continuing diarrhea, present for several days despite antimotility agents; rash; and malaise. We contacted the [facility] and their advice was to admit the patient, change his medications to intravenous, hydrate, proceeded to upper and lower endoscopy, evaluate for norovirus and rotavirus infection as well of course as for other possible causes of his diarrhea, and to grade his likely graft-versus-host disease to help determine the optimal course of treatment. The patient is being admitted to implement this plan.  HISTORY OF PRESENT ILLNESS: We have very complete records from Dr. Sydnee Levans in [place], and in summary: The patient was initially diagnosed in August 2000, with a white cell count of 23,600, but normal hemoglobin and platelets, and no significant symptomatology. Over the next several years his white cell count drifted up, and he eventually developed some symptoms of night sweats in particular, leading to treatment with fludarabine, Cytoxan and rituxan for five cycles given between December 2006 and May 2007.  We have CT scans from June 2006, November 2006 and April 2007, and comparing the November 2006 and April 2007 scans, there was near complete response. He had subsequent therapy in [place] as detailed below, but with decreased response, leading to allogeneic stem-cell transplant at the [facility] 02/24/2012.  REVIEW OF SYSTEMS: Daundre has had more fatigue. He has only walked twice this week, when previously he was walking on a daily basis. He has no appetite. He continues to lose weight. He is having some nausea, but fortunately no emesis. He tells me he had only 2 bowel movements  yesterday, but each was "about a quart", and all liquid. He has not noted any blood or mucus in the stool. His abdomen feels sore and "sensitive" but he denies any abdominal cramping or pain. He has had no fevers, chills, or night sweats. He is breathing well with no increased shortness of breath, cough, or phlegm production, and denies any chest pain or palpitations. He has had no abnormal headaches.  He denies pain, and has had no myalgias, arthralgias, or bony pain. He has been using some topical hydrocortisone cream on the "red spots" on his abdomen and these have improved.    PAST MEDICAL HISTORY: Past Medical History   Diagnosis  Date   .  Transplant recipient  07/12/2012   .  Chronic graft-versus-host disease     .  Diverticular disease     .  Hyperlipidemia     .  Obesity     .  Hypertension     .  Hiatal hernia     .  CMV (cytomegalovirus) antibody positive         pre-transplant, with seroconversion x2 pst-transplant   .  Right bundle branch block         pre-transplant   .  CKD (chronic kidney disease) stage 2, GFR 60-89 ml/min     .  Pancytopenia     .  Steroid-induced diabetes     .  Atrial fibrillation         post-transplant   .  Myopathy     .  Fine tremor         likely secondary to tacrolimus   .  Leukemia, chronic lymphoid     .  Chronic graft-versus-host disease        PAST SURGICAL HISTORY: Past Surgical History   Procedure  Date   .  Tonsillectomy and adenoidectomy     .  Bone marrow transplant        FAMILY HISTORY Family History   Problem  Relation  Age of Onset   .  Cancer  Father      The patient's father died from complications of chronic lymphocytic leukemia at the age of 39.  It had been diagnosed seven years before when he was 49.  The patient is enrolled in a familial chronic lymphocytic leukemia study out of the [facility].  The patient's mother is 69, alive, unfortunately suffering with dementia. He has a younger brother who  is otherwise in fair health.    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  His wife of >40 years, [name], is a homemaker.  Their daughter, [name], lives in [place].  She also is a Futures trader.  The patient has an 36 year old grandson and an 71-year-old granddaughter, and that is really the main reason he moved to this area.  He is a International aid/development worker.                        ADVANCED DIRECTIVES:   HEALTH MAINTENANCE: History   Substance Use Topics   .  Smoking status:  Never Smoker    .  Smokeless tobacco:  Never Used   .  Alcohol Use:  No                  Colonoscopy:             PAP:             Bone density:             Lipid panel:    Allergies   Allergen  Reactions   .  Benadryl (Diphenhydramine Hcl)         "Restless leg syndrome"       Current Outpatient Prescriptions   Medication  Sig  Dispense  Refill   .  acyclovir (ZOVIRAX) 400 MG tablet  Take 1 tablet (400 mg total) by mouth 2 (two) times daily.   60 tablet   2   .  Alum & Mag Hydroxide-Simeth (MAGIC MOUTHWASH W/LIDOCAINE) SOLN  Take 5 mLs by mouth 4 (four) times daily as needed. Mouth sores         .  antiseptic oral rinse (BIOTENE) LIQD  15 mLs by Mouth Rinse route 2 times daily at 12 noon and 4 pm.   237 mL   6   .  Calcium Citrate (CITRACAL PO)  Take 1,200 mg by mouth 2 (two) times daily.         .  chlorhexidine (PERIDEX) 0.12 % solution           .  cholecalciferol (VITAMIN D) 1000 UNITS tablet  Take 1,000 Units by mouth daily.         Marland Kitchen  dexamethasone 0.5 MG/5ML elixir  5ml swish and spit for mouth sores up to 5 times a day prn   237 mL   3   .  dextrose 5 % SOLN 50 mL with magnesium sulfate 50 % SOLN 1 g  Inject 1 g into the vein 3 (three) times a  week. Gets this at [facility]         .  filgrastim (NEUPOGEN) 480 MCG/1.6ML injection  Inject 480 mcg into the skin 3 (three) times a week.          .   heparin lock flush 100 UNIT/ML SOLN  Use as directed for hickman catheter care   60 Syringe   3   .  labetalol (NORMODYNE) 200 MG tablet  Take 200 mg by mouth 2 (two) times daily.         Marland Kitchen  levofloxacin (LEVAQUIN) 750 MG tablet  Take 1 tablet (750 mg total) by mouth daily.   30 tablet   1   .  lisinopril (PRINIVIL,ZESTRIL) 10 MG tablet  Take 1 tablet (10 mg total) by mouth at bedtime.   30 tablet   2   .  LORazepam (ATIVAN) 0.5 MG tablet  Take 0.5 mg by mouth every 6 (six) hours as needed. anxiety         .  meclizine (ANTIVERT) 25 MG tablet  1/2 - 1 tab PO BID PRN vertigo   30 tablet   0   .  Multiple Vitamins-Minerals (CENTRUM SILVER ADULT 50+) TABS  Take 1 tablet by mouth daily.         .  Omega-3 Fatty Acids (FISH OIL) 300 MG CAPS  Take 600 mg by mouth daily.         Marland Kitchen  omeprazole (PRILOSEC) 20 MG capsule  Take 20 mg by mouth daily.         .  ondansetron (ZOFRAN-ODT) 8 MG disintegrating tablet  Take 1 tablet (8 mg total) by mouth 2 (two) times daily.   30 tablet   1   .  oxycodone (OXY-IR) 5 MG capsule  Take 1 capsule (5 mg total) by mouth every 6 (six) hours as needed. Pain   30 capsule   0   .  predniSONE (DELTASONE) 5 MG tablet  Take 5 mg by mouth 2 (two) times daily. AS OF 11/10/2012 -- 10mg  in AM and 5mg  in PM         .  prochlorperazine (COMPAZINE) 10 MG tablet  Take 10 mg by mouth every 6 (six) hours as needed. Nausea         .  sertraline (ZOLOFT) 50 MG tablet  Take 50-100 mg by mouth daily. Takes 50 mg one day and 100 mg the next day         .  Sodium Chloride Flush 0.9 % SOLN  Use as directed for hickman catheter flushes   60 Syringe   3   .  sulfamethoxazole-trimethoprim (BACTRIM DS) 800-160 MG per tablet  Take 1 tablet by mouth. Takes twice per week         .  tacrolimus (PROGRAF) 1 MG capsule  Take 1 mg by mouth 2 (two) times daily. Takes with a 0.5 mg capsule           OBJECTIVE: Middle-aged white male who appears fatigued  Filed Vitals:     11/10/12 1446   BP:  100/65     Pulse:  80   Temp:  98.2 F (36.8 C)   Resp:  20       Body mass index is 27.32 kg/(m^2).      Filed Weights     11/10/12 1446   Weight:  179 lb 11.2 oz (81.511 kg)    ECOG FS: 2   Sclerae unicteric  Oropharynx showed no obvious sores or thrush No cervical or supraclavicular adenopathy   Lungs clear to auscultation,  no rales or rhonchi   Heart regular rate and rhythm, no murmur appreciated Abd  +BS, slightly tender to palpation diffusely, no guarding or rebound   MSK  minimal bilateral ankle edema, nonpitting.  Neuro: nonfocal, alert and oriented x 3 Skin: pending  LAB RESULTS:   CMV   Negative                       11/04/2012               Negative                      10/29/2012             Negative                      10/21/2012             Negative                      10/14/2012             Negative                      10/06/2012             Negative                      09/29/2012             Negative                      09/24/2012             Negative                      09/15/2012             Positive, <200             09/08/2012          Lab Results   Component  Value  Date     WBC  18.3*  11/10/2012     NEUTROABS  15.9*  11/10/2012     HGB  14.1  11/10/2012     HCT  42.1  11/10/2012     MCV  93.6  11/10/2012     PLT  122*  11/10/2012         Chemistry        Component  Value  Date/Time     NA  133*  11/04/2012 1317     NA  136  08/26/2012 0540     K  3.9  11/04/2012 1317     K  3.7  08/26/2012 0540     CL  101  11/04/2012 1317     CL  107  08/26/2012 0540     CO2  25  11/04/2012 1317     CO2  21  08/26/2012 0540     BUN  16.0  11/04/2012 1317     BUN  8  08/26/2012 0540     CREATININE  1.2  11/04/2012 1317     CREATININE  1.03  08/26/2012 0540         Component  Value  Date/Time  CALCIUM  9.2  11/04/2012 1317     CALCIUM  8.2*  08/26/2012 0540     ALKPHOS  138  11/04/2012 1317     ALKPHOS  84  08/26/2012 0540     AST  26  11/04/2012 1317      AST  18  08/26/2012 0540     ALT  21  11/04/2012 1317     ALT  7  08/26/2012 0540     BILITOT  0.32  11/04/2012 1317     BILITOT  0.2*  08/26/2012 0540          IgG:     816      11/04/2012   LDH:   123      11/01/2012     Lipid Panel      Component  Value  Date/Time     CHOL  225*  08/20/2012 1341     TRIG  301*  08/20/2012 1341     HDL  23*  08/20/2012 1341     CHOLHDL  9.8  08/20/2012 1341     VLDL  60*  08/20/2012 1341     LDLCALC  142*  08/20/2012 1341      Magnesium was 2.0 on 11/04/2012.   Results for RACHARD, ISIDRO (MRN [id number]) as of 10/14/2012 17:26   Ref. Range  07/21/2012 10:20  07/28/2012 15:22  08/04/2012 14:45  08/06/2012 15:10  08/11/2012 09:17   Tacrolimus Lvl  Latest Range: 5.0-20.0 ng/mL  3.9 (L)  9.7  9.0  6.9  6.9    STUDIES: No results found.  ASSESSMENT: 66 y.o. [place] man with a history of well-differentiated lymphocytic lymphoma/ chronic lymphoid leukemia initially diagnosed in 2000, not requiring intervention until 2006; with multiple chromosomal abnormalities.  His treatment history is as follows:   (1) fludarabine/cyclophosphamide/rituximab x5 completed May 2007.   (2) rituximab for 8 doses October 2010, with partial response   (3) Leustatin and ofatumumab weekly x8 July to September 2011 followed by maintenance ofatumumab maintenance ofatumumab every 2 months, with initial response but rising counts September 2012   (4) status-post unrelated donor stem-cell transplant 02/24/2012 at the [facility]   (a) conditioning regimen consisted of fludarabine + TBI at 200 cGy, followed by rituximab x27;   (b) CMV reactivation x3 (patient CMV positive, donor negative), s/p ganciclovir treatment; 3d reactivation August 2013, s/p gancyclovir, with negative PCR mid-September 2013; last gancyclovir dose 10/06/2012 (c) GVHD: involving gut and skin, treated with steroids, tacrolimus and MMF   (d) atrial fibrillation: resolved on amiodarone   (e) steroid-induced  myopathy: improving   (f) hypomagnesemia: on continuing IV replacement (g) hypogammaglobulinemia: s/p IVIG most recently 10/01/2012 (h) elevated triglycerides (606 on 07/14/2012)   (i) adrenal insufficiency: on prednisone taper (j) pancytopenia; Neulasta now PRN neutrophil count <1800   (5) restaging studies September 2013  including CT scans, flow cytometry, and bone marrow biopsy, showed no evidence of residual chronic lymphoid leukemia.  (6) now with a rash and severe diarrhea suggestive of worsening graft-versus-host disease.    PLAN: The current symptoms actually started when his tacrolimus level was decreased to below 1 mg twice a day. Even though we quickly resumed the prior dose and also opt his prednisone dose slightly I., there has been no improvement, in fact his overall symptoms have worsened. The plan is for aggressive hydration, microbiologic evaluation of the stool, and upper and lower endoscopy as well as thorough oral and skin exams to best grade the patient's graft-versus-host  situation, if that is what is going on, as we expect; keeping in close communication with [facility] so they may guide [name] regarding optimal treatment for Mr. Hoffer.

## 2012-11-17 ENCOUNTER — Encounter (HOSPITAL_COMMUNITY)
Admission: AD | Disposition: A | Discharge status: Home / Self Care | Payer: Self-pay | Source: Ambulatory Visit | Attending: Oncology

## 2012-11-17 ENCOUNTER — Ambulatory Visit: Payer: Self-pay

## 2012-11-17 ENCOUNTER — Other Ambulatory Visit: Payer: Self-pay | Admitting: Lab

## 2012-11-17 ENCOUNTER — Ambulatory Visit: Payer: Self-pay | Admitting: Oncology

## 2012-11-17 ENCOUNTER — Encounter (HOSPITAL_COMMUNITY): Payer: Self-pay | Admitting: *Deleted

## 2012-11-17 DIAGNOSIS — T8600 Unspecified complication of bone marrow transplant: Principal | ICD-10-CM

## 2012-11-17 HISTORY — PX: ESOPHAGOGASTRODUODENOSCOPY: SHX5428

## 2012-11-17 HISTORY — PX: FLEXIBLE SIGMOIDOSCOPY: SHX5431

## 2012-11-17 LAB — TACROLIMUS, BLOOD: Tacrolimus Lvl: <2 ng/mL — ABNORMAL LOW (ref 5.0–20.0)

## 2012-11-17 LAB — COMPREHENSIVE METABOLIC PANEL
AST: 25 U/L (ref 0–37)
BUN: 21 mg/dL (ref 6–23)
CO2: 16 mEq/L — ABNORMAL LOW (ref 19–32)
Calcium: 7.7 mg/dL — ABNORMAL LOW (ref 8.4–10.5)
Chloride: 105 mEq/L (ref 96–112)
Creatinine, Ser: 0.91 mg/dL (ref 0.50–1.35)
GFR calc Af Amer: >90 mL/min (ref >90)
GFR calc non Af Amer: 86 mL/min — ABNORMAL LOW (ref >90)
Total Bilirubin: 0.2 mg/dL — ABNORMAL LOW (ref 0.3–1.2)

## 2012-11-17 LAB — CBC WITH DIFFERENTIAL/PLATELET
Basophils Absolute: 0 10*3/uL (ref 0.0–0.1)
Eosinophils Relative: 1 % (ref 0–5)
HCT: 33.4 % — ABNORMAL LOW (ref 39.0–52.0)
Hemoglobin: 11.7 g/dL — ABNORMAL LOW (ref 13.0–17.0)
Lymphocytes Relative: 25 % (ref 12–46)
MCHC: 35 g/dL (ref 30.0–36.0)
MCV: 86.3 fL (ref 78.0–100.0)
Monocytes Absolute: 0.9 10*3/uL (ref 0.1–1.0)
Monocytes Relative: 15 % — ABNORMAL HIGH (ref 3–12)
Neutro Abs: 3.6 10*3/uL (ref 1.7–7.7)
RDW: 15.2 % (ref 11.5–15.5)
WBC: 6.1 10*3/uL (ref 4.0–10.5)

## 2012-11-17 LAB — MAGNESIUM: Magnesium: 1.7 mg/dL (ref 1.5–2.5)

## 2012-11-17 SURGERY — SIGMOIDOSCOPY, FLEXIBLE
Anesthesia: Moderate Sedation

## 2012-11-17 MED ORDER — MIDAZOLAM HCL 10 MG/2ML IJ SOLN
INTRAMUSCULAR | Status: DC | PRN
  Administered 2012-11-17 (×2): 2 mg via INTRAVENOUS
  Administered 2012-11-17: 1 mg via INTRAVENOUS

## 2012-11-17 MED ORDER — ENSURE PUDDING PO PUDG
1.0000 | Freq: Three times a day (TID) | ORAL | Status: DC
  Filled 2012-11-17 (×4): qty 1

## 2012-11-17 MED ORDER — FENTANYL CITRATE 0.05 MG/ML IJ SOLN
INTRAMUSCULAR | Status: DC | PRN
  Administered 2012-11-17 (×2): 25 ug via INTRAVENOUS

## 2012-11-17 MED ORDER — METHYLPREDNISOLONE SODIUM SUCC 125 MG IJ SOLR
80.0000 mg | Freq: Every day | INTRAMUSCULAR | Status: DC
  Administered 2012-11-17: 80 mg via INTRAVENOUS
  Filled 2012-11-17 (×2): qty 1.28

## 2012-11-17 MED ORDER — MIDAZOLAM HCL 10 MG/2ML IJ SOLN
INTRAMUSCULAR | Status: AC
  Filled 2012-11-17: qty 2

## 2012-11-17 MED ORDER — DEXTROSE 5 % IV SOLN
160.0000 mg | INTRAVENOUS | Status: DC
  Administered 2012-11-17: 160 mg via INTRAVENOUS
  Filled 2012-11-17 (×2): qty 10

## 2012-11-17 MED ORDER — BUTAMBEN-TETRACAINE-BENZOCAINE 2-2-14 % EX AERO
INHALATION_SPRAY | CUTANEOUS | Status: DC | PRN
  Administered 2012-11-17: 2 via TOPICAL

## 2012-11-17 MED ORDER — FENTANYL CITRATE 0.05 MG/ML IJ SOLN
INTRAMUSCULAR | Status: AC
  Filled 2012-11-17: qty 2

## 2012-11-17 NOTE — Op Note (Signed)
[facility] [address] [place], [address]   ENDOSCOPY PROCEDURE REPORT  PATIENT: Timothy [name], Timothy [name]  MR#: [id number] BIRTHDATE: Nov 05, 1946 , 66  yrs. old GENDER: Male  ENDOSCOPIST: Vida Rigger, MD REFERRED AO:[id number] Magrinat, M.D.  PROCEDURE DATE:  11/17/2012 PROCEDURE:   EGD w/ biopsy ASA CLASS:   Class II INDICATIONS:rule out graft-versus-host disease in a patient with significant weight loss.  MEDICATIONS: Fentanyl 50 mcg IV and Versed 5 mg IV  TOPICAL ANESTHETIC:  DESCRIPTION OF PROCEDURE:   After the risks benefits and alternatives of the procedure were thoroughly explained, informed consent was obtained.  The [id number]  endoscope was introduced through the mouth and advanced to the third portionor or more  likely fourth of the duodenum , limited by Without limitations.   The instrument was slowly withdrawn as the mucosa was fully examined.there was no significant finding with only minimal gastritis and antritis and minimal distal esophageal reflux although possibly some very short segment Barrett and biopsy of all areas were obtained and the patient tolerated the procedure well there was no obvious immediate complications        FINDINGS:1. Minimal distal esophagitis with questionable very short segment Barrett's status post biopsy 2. Minimal gastritis antritis status post biopsy 3. Otherwise within normal limits to probably the fourth portion of the duodenum status post duodenal and proximal and mid esophageal biopsies as well  COMPLICATIONS:none  ENDOSCOPIC IMPRESSION: above   RECOMMENDATIONS:await pathologywhich were ordered ASAP continue workup with a flexible sigmoidoscopy and biopsy and further workup and plans per oncology team   REPEAT EXAM: as needed   _______________________________ Vida Rigger, MD eSigned:  Vida Rigger, MD 11/17/2012 9:03 AM    EX:BMWUXL Magrinat, MD  PATIENT NAME:  Timothy [name], Timothy [name] MR#: [id number]

## 2012-11-17 NOTE — Op Note (Signed)
[facility] [address] [place], [address]   FLEXIBLE SIGMOIDOSCOPY PROCEDURE REPORT  PATIENT: Timothy [name], Timothy [name]  MR#: [id number] BIRTHDATE: Nov 16, 1946 , 66  yrs. old GENDER: Male ENDOSCOPIST: Vida Rigger, MD REFERRED BY: Ruthann Cancer, M.D. PROCEDURE DATE:  11/17/2012 PROCEDURE:   Sigmoidoscopy with biopsy ASA CLASS:   Class II INDICATIONS:unexplained diarrhea. and rule out graft-versus-host MEDICATIONS: None (none)   no additional  DESCRIPTION OF PROCEDURE:   After the risks benefits and alternatives of the procedure were thoroughly explained, informed consent was obtained.  revealed external hemorrhoids. The [id number] ([id number])  endoscope was introduced through the anus  and advanced to the sigmoid colon , limited by No adverse events experienced. The quality of the prep was poor .it was done unprepped  The instrument was then slowly withdrawn as the mucosa was fully examined.we were only able to advance about 30 cm but no mucosal abnormalities were seenanda few random biopsies were obtainedand once back in the rectum anal rectal pull-through and retroflexion did confirm some small hemorrhoids air was suctioned scope removed patient tolerated the procedure well there was no obvious immediate complication       COLON FINDINGS: The colonic mucosa appeared normal in the sigmoid colon and rectum.and a few random biopsies were obtained Retroflexed views revealed internal hemorrhoid.    The scope was then withdrawn from the patient and the procedure terminated.  COMPLICATIONS: There were no complications.  ENDOSCOPIC IMPRESSION:internal/external hemorrhoid otherwise within normal limits to 30 however with unprepped exam lesions could have been missed and random biopsies obtained  RECOMMENDATIONS:Will send biopsies ASAP and please let us know if we can be of any further assistance   REPEAT EXAM:    as needed   _______________________________ eSigned:   Vida Rigger, MD 11/17/2012 9:10 AM   ZH:[id number] Magrinat, MD

## 2012-11-17 NOTE — Progress Notes (Signed)
Timothy [name]   DOB:11/18/1946   ZO#:[id number]   JWJ#:[id number]  Subjective:Owain tolerated the upper and lower endoscopies this AM without event; he had 2 BMs so far today, at about 11 AM and 4 PM, both liquid, both about 2-3 cups per his estimate, but he was incontinent the first time and volume may have been higher; he was npo overnight, of course, but still had essentially no lunch; he is hoping to eat some tonight; he has been in bed all day, feeling tired, but otherwise "better," likely because of hydration; denied mouth pain, sore throat (beyond the irritation expected from the EGD), and no nausea or vomiting; he remains minimally itchy; no arthralgias, stiffness or swelling he has noted; rash persists; wife [name] in room.   Objective: middle aged white man examined in bed Filed Vitals:   11/17/12 1325  BP: 126/72  Pulse: 89  Temp: 98.2 F (36.8 C)  Resp: 20    Body mass index is 27.19 kg/(m^2).   Sclerae unicteric; eyelids slightly less irritated than yesterday  Oropharynx shows thrush; I don't see ulcerations  No peripheral adenopathy  Lungs clear -- no rales or rhonchi, fair excursion bilaterally  Heart regular rate and rhythm, no murmur appreciated  Abdomen soft, NT, +BS  MSK no peripheral edema; joints flexible  Neuro nonfocal, A&O x3  Skin: patchy nonconfluent nonpalpable erythematous rash; more focal palpable acneiform rash involving Right shoulder and adjacent chest wall  CBG (last 3)  Tacrolimus level on admission < 2.0 ng/ml   Labs:  Lab Results  Component Value Date   WBC 6.1 11/17/2012   HGB 11.7* 11/17/2012   HCT 33.4* 11/17/2012   MCV 86.3 11/17/2012   PLT 120* 11/17/2012   NEUTROABS 3.6 11/17/2012    eos 1%, monos 15%, ANC 3.6, ALC 1.5  Urine Studies No results found for this basename: UACOL:2,UAPR:2,USPG:2,UPH:2,UTP:2,UGL:2,UKET:2,UBIL:2,UHGB:2,UNIT:2,UROB:2,ULEU:2,UEPI:2,UWBC:2,URBC:2,UBAC:2,CAST:2,CRYS:2,UCOM:2,BILUA:2 in the last 72 hours  Basic  Metabolic Panel:  Lab 11/17/12 6213 11/16/12 1800  NA 131* 132*  K 3.9 3.7  CL 105 105  CO2 16* 16*  GLUCOSE 81 83  BUN 21 23  CREATININE 0.91 0.98  CALCIUM 7.7* 8.1*  MG 1.7 1.5  PHOS -- 3.3   GFR Estimated Creatinine Clearance: 77.3 ml/min (by C-G formula based on Cr of 0.91). Liver Function Tests:  Lab 11/17/12 0720 11/16/12 1800  AST 25 25  ALT 24 30  ALKPHOS 38* 44  BILITOT 0.2* 0.3  PROT 3.8* 4.4*  ALBUMIN 2.1* 2.4*   No results found for this basename: LIPASE:5,AMYLASE:5 in the last 168 hours No results found for this basename: AMMONIA:5 in the last 168 hours Coagulation profile  Lab 11/16/12 1800  INR 1.11  PROTIME --    CBC:  Lab 11/17/12 0720 11/16/12 1800  WBC 6.1 7.6  NEUTROABS 3.6 4.8  HGB 11.7* 13.3  HCT 33.4* 37.5*  MCV 86.3 86.6  PLT 120* 130*   Cardiac Enzymes: No results found for this basename: CKTOTAL:5,CKMB:5,CKMBINDEX:5,TROPONINI:5 in the last 168 hours BNP: No components found with this basename: POCBNP:5 CBG: No results found for this basename: GLUCAP:5 in the last 168 hours D-Dimer No results found for this basename: DDIMER:2 in the last 72 hours Hgb A1c No results found for this basename: HGBA1C:2 in the last 72 hours Lipid Profile No results found for this basename: CHOL:2,HDL:2,LDLCALC:2,TRIG:2,CHOLHDL:2,LDLDIRECT:2 in the last 72 hours Thyroid function studies No results found for this basename: TSH,T4TOTAL,FREET3,T3FREE,THYROIDAB in the last 72 hours Anemia work up No results found for this basename: VITAMINB12:2,FOLATE:2,FERRITIN:2,TIBC:2,IRON:2,RETICCTPCT:2  in the last 72 hours Microbiology No results found for this or any previous visit (from the past 240 hour(s)).    Studies:  X-ray Chest Pa And Lateral   11/16/2012  *RADIOLOGY REPORT*  Clinical Data: Shortness of breath and weakness  CHEST - 2 VIEW  Comparison: 08/04/2012  Findings: The heart and pulmonary vascularity are within normal limits.  The lungs are clear  bilaterally.  The previously seen bibasilar changes and effusions have resolved in the interval.  A right-sided central venous catheter is again noted.  IMPRESSION: No acute abnormality noted.   Original Report Authenticated By: Alcide Clever, M.D.     Assessment: 66 y.o. [place] man with a history of well-differentiated lymphocytic lymphoma/ chronic lymphoid leukemia initially diagnosed in 2000, not requiring intervention until 2006; with multiple chromosomal abnormalities. His treatment history is as follows:   (1) fludarabine/cyclophosphamide/rituximab x5 completed May 2007.  (2) rituximab for 8 doses October 2010, with partial response  (3) Leustatin and ofatumumab weekly x8 July to September 2011 followed by maintenance ofatumumab maintenance ofatumumab every 2 months, with initial response but rising counts September 2012  (4) status-post unrelated donor stem-cell transplant [facility]/24/2012 at [facility]  (a) conditioning regimen consisted of fludarabine + TBI at 200 cGy, followed by rituximab x27;  (b) CMV reactivation x3 (patient CMV positive, donor negative), s/p ganciclovir treatment; 3d reactivation August 2013, s/p gancyclovir, with negative PCR mid-September 2013; last gancyclovir dose 10/06/2012  (c) GVHD: involving gut and skin, treated with steroids, tacrolimus and MMF  (d) atrial fibrillation: resolved on amiodarone  (e) steroid-induced myopathy: improving until current crisis (f) hypomagnesemia: resolved with discontinuation of gancyclovir (g) hypogammaglobulinemia: s/p IVIG most recently 10/01/2012; IgG 617 on11/20/2013 (h) elevated triglycerides (606 on 07/14/2012)  (i) adrenal insufficiency: on prednisone  (j) pancytopenia; off growth factors (5) restaging studies September 2013 including CT scans, flow cytometry, and bone marrow biopsy, showed no evidence of residual chronic lymphoid leukemia.  (6) now with a rash, severe diarrhea and gastric/duodenal/colonic biopsied c/w  GVHD  Plan: the low tacrolimus level on admission argues for lack of absorption of tacrolimus and likely the prednisone as well; a possible scenario is: (a) tacrolimus taper as per protocol leading to (b) mild GVHD leading to (c) decreased absorption of immunosupressants, leading to (d) worsening GVHD, etc.  I have discussed the overall situation with [place] and expect to hear from them later tonight. Likely we will temporarily intensify his steroids and continue the tacrolimus IV. I will obtain trough and peak levels. Otherwise will encourage him to be OOBTC and ambulate as tolerated. Will start ensure TID in addition to his regular diet. Will quantitate all I/O (discussed with nursing). Hope for discharge early next week.   Keaston Pile C 11/17/2012

## 2012-11-17 NOTE — Consult Note (Signed)
Reason for Consult: Rule out graft-versus-host disease Referring Physician: Oncology  Timothy [name] is an 66 y.o. male.  HPI: Patient with a long and complicated history who has had multiple procedures including both endoscopy and colonoscopy without any previous problem who had a bone marrow transplant in [place] and has had multiple complaints including diarrhea and rash and weight loss worrisome for graft-versus-host disease and we are consulted for endoscopy and flexible sigmoidoscopy to confirm or rule out. His family history is negative from a GI standpoint and he has lost about 100 pounds since March and has no upper tract symptoms and no abdominal pain and tends to have some alternating diarrhea and constipation but lately he's been having more diarrhea but no other specific component  Past Medical History  Diagnosis Date  . 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 Diverticular disease   . Hyperlipidemia   . Obesity   . Hypertension   . Hiatal hernia   . CMV (cytomegalovirus) antibody positive     pre-transplant, with seroconversion x2 pst-transplant  . Right bundle branch block     pre-transplant  . CKD (chronic kidney disease) stage 2, GFR 60-89 ml/min   . Pancytopenia   . Steroid-induced diabetes   . Atrial fibrillation     post-transplant  . Myopathy   . 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 Leukemia, chronic lymphoid   . Chronic graft-versus-host disease     Past Surgical History  Procedure Date  . Tonsillectomy and adenoidectomy   . Bone marrow transplant     Family History  Problem Relation Age of Onset  . Cancer Father     Social History:  reports that he has never smoked. He has never used smokeless tobacco. He reports that he does not drink alcohol or use illicit drugs.  Allergies:  Allergies  Allergen Reactions  . Benadryl (Diphenhydramine Hcl)     "Restless leg syndrome"    Medications: I have  reviewed the patient's current medications.  Results for orders placed during the hospital encounter of 11/16/12 (from the past 48 hour(s))  COMPREHENSIVE METABOLIC PANEL     Status: Abnormal   Collection Time   11/16/12  6:00 PM      Component Value Range Comment   Sodium 132 (*) 135 - 145 mEq/L    Potassium 3.7  3.5 - 5.1 mEq/L    Chloride 105  96 - 112 mEq/L    CO2 16 (*) 19 - 32 mEq/L    Glucose, Bld 83  70 - 99 mg/dL    BUN 23  6 - 23 mg/dL    Creatinine, Ser 1.61  0.50 - 1.35 mg/dL    Calcium 8.1 (*) 8.4 - 10.5 mg/dL    Total Protein 4.4 (*) 6.0 - 8.3 g/dL    Albumin 2.4 (*) 3.5 - 5.2 g/dL    AST 25  0 - 37 U/L    ALT 30  0 - 53 U/L    Alkaline Phosphatase 44  39 - 117 U/L    Total Bilirubin 0.3  0.3 - 1.2 mg/dL    GFR calc non Af Amer 84 (*) >90 mL/min    GFR calc Af Amer >90  >90 mL/min   MAGNESIUM     Status: Normal   Collection Time   11/16/12  6:00 PM      Component Value Range Comment   Magnesium 1.5  1.5 - 2.5 mg/dL  PHOSPHORUS     Status: Normal   Collection Time   11/16/12  6:00 PM      Component Value Range Comment   Phosphorus 3.3  2.3 - 4.6 mg/dL   CBC WITH DIFFERENTIAL     Status: Abnormal   Collection Time   11/16/12  6:00 PM      Component Value Range Comment   WBC 7.6  4.0 - 10.5 K/uL    RBC 4.33  4.22 - 5.81 MIL/uL    Hemoglobin 13.3  13.0 - 17.0 g/dL    HCT 91.4 (*) 78.2 - 52.0 %    MCV 86.6  78.0 - 100.0 fL    MCH 30.7  26.0 - 34.0 pg    MCHC 35.5  30.0 - 36.0 g/dL    RDW 95.6  21.3 - 08.6 %    Platelets 130 (*) 150 - 400 K/uL    Neutrophils Relative 63  43 - 77 %    Neutro Abs 4.8  1.7 - 7.7 K/uL    Lymphocytes Relative 22  12 - 46 %    Lymphs Abs 1.7  0.7 - 4.0 K/uL    Monocytes Relative 14 (*) 3 - 12 %    Monocytes Absolute 1.1 (*) 0.1 - 1.0 K/uL    Eosinophils Relative 0  0 - 5 %    Eosinophils Absolute 0.0  0.0 - 0.7 K/uL    Basophils Relative 0  0 - 1 %    Basophils Absolute 0.0  0.0 - 0.1 K/uL   APTT     Status: Abnormal    Collection Time   11/16/12  6:00 PM      Component Value Range Comment   aPTT 41 (*) 24 - 37 seconds   PROTIME-INR     Status: Normal   Collection Time   11/16/12  6:00 PM      Component Value Range Comment   Prothrombin Time 14.2  11.6 - 15.2 seconds    INR 1.11  0.00 - 1.49   SEDIMENTATION RATE     Status: Normal   Collection Time   11/16/12  6:00 PM      Component Value Range Comment   Sed Rate 0  0 - 16 mm/hr   LACTATE DEHYDROGENASE     Status: Normal   Collection Time   11/16/12  6:00 PM      Component Value Range Comment   LDH 159  94 - 250 U/L   URINALYSIS, ROUTINE W REFLEX MICROSCOPIC     Status: Abnormal   Collection Time   11/16/12 10:43 PM      Component Value Range Comment   Color, Urine YELLOW  YELLOW    APPearance CLEAR  CLEAR    Specific Gravity, Urine 1.025  1.005 - 1.030    pH 5.5  5.0 - 8.0    Glucose, UA NEGATIVE  NEGATIVE mg/dL    Hgb urine dipstick NEGATIVE  NEGATIVE    Bilirubin Urine SMALL (*) NEGATIVE    Ketones, ur 15 (*) NEGATIVE mg/dL    Protein, ur NEGATIVE  NEGATIVE mg/dL    Urobilinogen, UA 0.2  0.0 - 1.0 mg/dL    Nitrite NEGATIVE  NEGATIVE    Leukocytes, UA NEGATIVE  NEGATIVE MICROSCOPIC NOT DONE ON URINES WITH NEGATIVE PROTEIN, BLOOD, LEUKOCYTES, NITRITE, OR GLUCOSE <1000 mg/dL.  CBC WITH DIFFERENTIAL     Status: Abnormal   Collection Time   11/17/12  7:20 AM  Component Value Range Comment   WBC 6.1  4.0 - 10.5 K/uL    RBC 3.87 (*) 4.22 - 5.81 MIL/uL    Hemoglobin 11.7 (*) 13.0 - 17.0 g/dL    HCT 40.9 (*) 81.1 - 52.0 %    MCV 86.3  78.0 - 100.0 fL    MCH 30.2  26.0 - 34.0 pg    MCHC 35.0  30.0 - 36.0 g/dL    RDW 91.4  78.2 - 95.6 %    Platelets 120 (*) 150 - 400 K/uL    Neutrophils Relative 59  43 - 77 %    Neutro Abs 3.6  1.7 - 7.7 K/uL    Lymphocytes Relative 25  12 - 46 %    Lymphs Abs 1.5  0.7 - 4.0 K/uL    Monocytes Relative 15 (*) 3 - 12 %    Monocytes Absolute 0.9  0.1 - 1.0 K/uL    Eosinophils Relative 1  0 - 5 %     Eosinophils Absolute 0.0  0.0 - 0.7 K/uL    Basophils Relative 0  0 - 1 %    Basophils Absolute 0.0  0.0 - 0.1 K/uL   COMPREHENSIVE METABOLIC PANEL     Status: Abnormal   Collection Time   11/17/12  7:20 AM      Component Value Range Comment   Sodium 131 (*) 135 - 145 mEq/L    Potassium 3.9  3.5 - 5.1 mEq/L    Chloride 105  96 - 112 mEq/L    CO2 16 (*) 19 - 32 mEq/L    Glucose, Bld 81  70 - 99 mg/dL    BUN 21  6 - 23 mg/dL    Creatinine, Ser 2.13  0.50 - 1.35 mg/dL    Calcium 7.7 (*) 8.4 - 10.5 mg/dL    Total Protein 3.8 (*) 6.0 - 8.3 g/dL    Albumin 2.1 (*) 3.5 - 5.2 g/dL    AST 25  0 - 37 U/L    ALT 24  0 - 53 U/L    Alkaline Phosphatase 38 (*) 39 - 117 U/L    Total Bilirubin 0.2 (*) 0.3 - 1.2 mg/dL    GFR calc non Af Amer 86 (*) >90 mL/min    GFR calc Af Amer >90  >90 mL/min   MAGNESIUM     Status: Normal   Collection Time   11/17/12  7:20 AM      Component Value Range Comment   Magnesium 1.7  1.5 - 2.5 mg/dL     X-ray Chest Pa And Lateral   11/16/2012  *RADIOLOGY REPORT*  Clinical Data: Shortness of breath and weakness  CHEST - 2 VIEW  Comparison: 08/04/2012  Findings: The heart and pulmonary vascularity are within normal limits.  The lungs are clear bilaterally.  The previously seen bibasilar changes and effusions have resolved in the interval.  A right-sided central venous catheter is again noted.  IMPRESSION: No acute abnormality noted.   Original Report Authenticated By: Alcide Clever, M.D.     ROS please see oncology note for detail Blood pressure 123/69, pulse 83, temperature 99 F (37.2 C), temperature source Oral, resp. rate 16, height 5\' 8"  (1.727 m), weight 81.103 kg (178 lb 12.8 oz), SpO2 94.00%. Physical Exam vital signs stable afebrile no acute distress lungs are clear heart regular rate and rhythm abdomen is soft nontender labs and chest x-ray reviewed  Assessment/Plan: Multiple medical problems worrisome for graft-versus-host disease main  GI complaint is  diarrhea Plan: The risks benefits methods of endoscopy and flexible sigmoidoscopy were discussed with both the patient and his case was discussed with his oncologist and will proceed this a.m. with further workup and plans pending those findings and biopsy results  Tiare Rohlman E 11/17/2012, 8:11 AM

## 2012-11-18 LAB — CBC WITH DIFFERENTIAL/PLATELET
Basophils Relative: 0 % (ref 0–1)
Eosinophils Absolute: 0 10*3/uL (ref 0.0–0.7)
Hemoglobin: 11.7 g/dL — ABNORMAL LOW (ref 13.0–17.0)
Lymphs Abs: 1 10*3/uL (ref 0.7–4.0)
MCH: 30.7 pg (ref 26.0–34.0)
MCHC: 35.3 g/dL (ref 30.0–36.0)
Monocytes Relative: 2 % — ABNORMAL LOW (ref 3–12)
Neutro Abs: 4.4 10*3/uL (ref 1.7–7.7)
Neutrophils Relative %: 81 % — ABNORMAL HIGH (ref 43–77)
Platelets: 129 10*3/uL — ABNORMAL LOW (ref 150–400)
RBC: 3.81 MIL/uL — ABNORMAL LOW (ref 4.22–5.81)

## 2012-11-18 LAB — COMPREHENSIVE METABOLIC PANEL
ALT: 22 U/L (ref 0–53)
AST: 22 U/L (ref 0–37)
Albumin: 1.9 g/dL — ABNORMAL LOW (ref 3.5–5.2)
Alkaline Phosphatase: 41 U/L (ref 39–117)
Calcium: 7.5 mg/dL — ABNORMAL LOW (ref 8.4–10.5)
Potassium: 5 mEq/L (ref 3.5–5.1)
Sodium: 131 mEq/L — ABNORMAL LOW (ref 135–145)
Total Protein: 3.7 g/dL — ABNORMAL LOW (ref 6.0–8.3)

## 2012-11-18 LAB — CLOSTRIDIUM DIFFICILE BY PCR: Toxigenic C. Difficile by PCR: NEGATIVE

## 2012-11-18 LAB — MAGNESIUM: Magnesium: 1.7 mg/dL (ref 1.5–2.5)

## 2012-11-18 MED ORDER — DEXTROSE 5 % IV SOLN
400.0000 mg | Freq: Two times a day (BID) | INTRAVENOUS | Status: DC
  Administered 2012-11-18 – 2012-11-24 (×14): 400 mg via INTRAVENOUS
  Filled 2012-11-18 (×15): qty 8

## 2012-11-18 MED ORDER — ONDANSETRON 8 MG/NS 50 ML IVPB
8.0000 mg | Freq: Four times a day (QID) | INTRAVENOUS | Status: DC | PRN
  Administered 2012-11-27 – 2012-12-01 (×4): 8 mg via INTRAVENOUS
  Filled 2012-11-18 (×6): qty 8

## 2012-11-18 MED ORDER — SODIUM CHLORIDE 0.9 % IV SOLN
INTRAVENOUS | Status: DC
  Administered 2012-11-18: 100 mL/h via INTRAVENOUS
  Administered 2012-11-18 – 2012-11-20 (×5): via INTRAVENOUS
  Administered 2012-11-22: 1000 mL via INTRAVENOUS
  Administered 2012-11-22 – 2012-11-23 (×4): via INTRAVENOUS
  Administered 2012-11-24: 1000 mL via INTRAVENOUS
  Administered 2012-11-24 – 2012-11-26 (×3): via INTRAVENOUS
  Administered 2012-11-28: 1000 mL via INTRAVENOUS
  Administered 2012-12-02: 15:00:00 via INTRAVENOUS

## 2012-11-18 MED ORDER — TACROLIMUS 5 MG/ML IV SOLN
Freq: Two times a day (BID) | INTRAVENOUS | Status: DC
  Administered 2012-11-18 – 2012-11-19 (×2): via INTRAVENOUS
  Filled 2012-11-18 (×3): qty 250

## 2012-11-18 MED ORDER — LEVOFLOXACIN IN D5W 500 MG/100ML IV SOLN
500.0000 mg | INTRAVENOUS | Status: DC
  Administered 2012-11-18 – 2012-11-24 (×7): 500 mg via INTRAVENOUS
  Filled 2012-11-18 (×8): qty 100

## 2012-11-18 MED ORDER — METHYLPREDNISOLONE SODIUM SUCC 125 MG IJ SOLR
60.0000 mg | Freq: Two times a day (BID) | INTRAMUSCULAR | Status: DC
  Administered 2012-11-18 – 2012-11-24 (×14): 60 mg via INTRAVENOUS
  Filled 2012-11-18 (×16): qty 0.96

## 2012-11-18 MED ORDER — SULFAMETHOXAZOLE-TRIMETHOPRIM 400-80 MG/5ML IV SOLN
160.0000 mg | INTRAVENOUS | Status: DC
  Administered 2012-11-18 – 2012-11-22 (×2): 160 mg via INTRAVENOUS
  Filled 2012-11-18 (×2): qty 10

## 2012-11-18 NOTE — Progress Notes (Signed)
Alfredo Spong   DOB:1946-08-08   ZO#:[id number]   [id number]#:[id number]  Subjective:Murvin .was started on solumedrol at 1 mg/kg last night; he had 3 watery BMs overnight; otherwise feels "a little better;" no mouth soreness or throat pain; no joint stiffness; reading [employer]; [name] not in room   Objective: middle aged white man examined in bed Filed Vitals:   11/18/12 0500  BP: 119/58  Pulse: 77  Temp: 98 F (36.7 C)  Resp: 18    Body mass index is 27.25 kg/(m^2). I > O 0.5L   Sclerae unicteric; eyelids slightly less irritated than yesterday; face less flushed  Oropharynx now nearly clear  No peripheral adenopathy  Lungs clear -- no rales or rhonchi, fair excursion bilaterally  Heart regular rate and rhythm, no murmur appreciated  Abdomen soft, NT, +BS  MSK no peripheral edema; joints flexible  Neuro nonfocal, A&O x3  Skin: the patchy nonconfluent nonpalpable erythematous rash described yesterday is largely faded; the more focal palpable acneiform rash involving Right shoulder and adjacent chest wall is flatter and dimmer, almost not palpable  Tacrolimus level on admission < 2.0 ng/ml; trough level yesterday pending   Labs:  Lab Results  Component Value Date   WBC 5.4 11/18/2012   HGB 11.7* 11/18/2012   HCT 33.1* 11/18/2012   MCV 86.9 11/18/2012   PLT 129* 11/18/2012   NEUTROABS 4.4 11/18/2012    eos 0% today  Urine Studies No results found for this basename: UACOL:2,UAPR:2,USPG:2,UPH:2,UTP:2,UGL:2,UKET:2,UBIL:2,UHGB:2,UNIT:2,UROB:2,ULEU:2,UEPI:2,UWBC:2,URBC:2,UBAC:2,CAST:2,CRYS:2,UCOM:2,BILUA:2 in the last 72 hours  Basic Metabolic Panel:  Lab 11/18/12 6213 11/17/12 0720 11/16/12 1800  NA 131* 131* 132*  K 5.0 3.9 --  CL 105 105 105  CO2 14* 16* 16*  GLUCOSE 110* 81 83  BUN 19 21 23   CREATININE 0.92 0.91 0.98  CALCIUM 7.5* 7.7* 8.1*  MG 1.7 1.7 1.5  PHOS -- -- 3.3   GFR Estimated Creatinine Clearance: 76.4 ml/min (by C-G formula based on Cr of 0.92). Liver  Function Tests:  Lab 11/18/12 0601 11/17/12 0720 11/16/12 1800  AST 22 25 25   ALT 22 24 30   ALKPHOS 41 38* 44  BILITOT 0.2* 0.2* 0.3  PROT 3.7* 3.8* 4.4*  ALBUMIN 1.9* 2.1* 2.4*   No results found for this basename: LIPASE:5,AMYLASE:5 in the last 168 hours No results found for this basename: AMMONIA:5 in the last 168 hours Coagulation profile  Lab 11/16/12 1800  INR 1.11  PROTIME --    CBC:  Lab 11/18/12 0601 11/17/12 0720 11/16/12 1800  WBC 5.4 6.1 7.6  NEUTROABS 4.4 3.6 4.8  HGB 11.7* 11.7* 13.3  HCT 33.1* 33.4* 37.5*  MCV 86.9 86.3 86.6  PLT 129* 120* 130*   Cardiac Enzymes: No results found for this basename: CKTOTAL:5,CKMB:5,CKMBINDEX:5,TROPONINI:5 in the last 168 hours BNP: No components found with this basename: POCBNP:5 CBG: No results found for this basename: GLUCAP:5 in the last 168 hours D-Dimer No results found for this basename: DDIMER:2 in the last 72 hours Hgb A1c No results found for this basename: HGBA1C:2 in the last 72 hours Lipid Profile No results found for this basename: CHOL:2,HDL:2,LDLCALC:2,TRIG:2,CHOLHDL:2,LDLDIRECT:2 in the last 72 hours Thyroid function studies No results found for this basename: TSH,T4TOTAL,FREET3,T3FREE,THYROIDAB in the last 72 hours Anemia work up No results found for this basename: VITAMINB12:2,FOLATE:2,FERRITIN:2,TIBC:2,IRON:2,RETICCTPCT:2 in the last 72 hours Microbiology Recent Results (from the past 240 hour(s))  CLOSTRIDIUM DIFFICILE BY PCR     Status: Normal   Collection Time   11/17/12  8:27 PM  Component Value Range Status Comment   C difficile by pcr NEGATIVE  NEGATIVE Final       Studies:  X-ray Chest Pa And Lateral   11/16/2012  *RADIOLOGY REPORT*  Clinical Data: Shortness of breath and weakness  CHEST - 2 VIEW  Comparison: 08/04/2012  Findings: The heart and pulmonary vascularity are within normal limits.  The lungs are clear bilaterally.  The previously seen bibasilar changes and effusions  have resolved in the interval.  A right-sided central venous catheter is again noted.  IMPRESSION: No acute abnormality noted.   Original Report Authenticated By: Alcide Clever, M.D.     Assessment: 66 y.o. 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  His treatment history is as follows:   (1) fludarabine/cyclophosphamide/rituximab x5 completed May 2007.  (2) rituximab for 8 doses October 2010, with partial response  (3) Leustatin and ofatumumab weekly x8 July to September 2011 followed by maintenance ofatumumab maintenance ofatumumab every 2 months, with initial response but rising counts September 2012  (4) status-post unrelated donor stem-cell transplant 02/24/2012 at the [facility]  (a) conditioning regimen consisted of fludarabine + TBI at 200 cGy, followed by rituximab x27;  (b) CMV reactivation x3 (patient CMV positive, donor negative), s/p ganciclovir treatment; 3d reactivation August 2013, s/p gancyclovir, with negative PCR mid-September 2013; last gancyclovir dose 10/06/2012  (c) GVHD: involving gut and skin, treated with steroids, tacrolimus and MMF  (d) atrial fibrillation: resolved on amiodarone  (e) steroid-induced myopathy: improving until current crisis (f) hypomagnesemia: resolved with discontinuation of gancyclovir (g) hypogammaglobulinemia: s/p IVIG most recently 10/01/2012; IgG 617 on11/20/2013 (h) elevated triglycerides (606 on 07/14/2012)  (i) adrenal insufficiency: on prednisone  (j) pancytopenia; off growth factors (5) restaging studies September 2013 including CT scans, flow cytometry, and bone marrow biopsy, showed no evidence of residual chronic lymphoid leukemia.  (6) now with a rash, severe diarrhea and gastric/duodenal/colonic biopsied c/w GVHD grade  2  Plan: the low tacrolimus level on admission argues for lack of absorption of tacrolimus and likely the prednisone as well; a possible scenario is: (a) tacrolimus taper as per protocol leading to (b) mild GVHD leading to (c) decreased absorption of immunosupressants, leading to (d) worsening GVHD, etc.  I reached Dr Irven Coe in [place] last night and she suggested starting solumedrol at 1 mg/kg/d. We have also changed the tacrolimus to IV, as well as the Septra. We can expect improvement over the next 48-72 hours. Once the diarrhea resolves we will advance diet, currently at clear liquids. Patient is better this AM as noted in exam above.  I do not anticipate major problems but if they develop the long-term-care line to [facility] is [phone], and they are available 24/7 to troubleshoot problems.  MAGRINAT,GUSTAV C 11/18/2012

## 2012-11-19 ENCOUNTER — Other Ambulatory Visit: Payer: Self-pay | Admitting: Physician Assistant

## 2012-11-19 LAB — CBC WITH DIFFERENTIAL/PLATELET
Eosinophils Absolute: 0 10*3/uL (ref 0.0–0.7)
Eosinophils Relative: 0 % (ref 0–5)
HCT: 31.9 % — ABNORMAL LOW (ref 39.0–52.0)
Hemoglobin: 11.3 g/dL — ABNORMAL LOW (ref 13.0–17.0)
Lymphs Abs: 0.9 10*3/uL (ref 0.7–4.0)
MCH: 30.1 pg (ref 26.0–34.0)
MCHC: 35.4 g/dL (ref 30.0–36.0)
MCV: 85.1 fL (ref 78.0–100.0)
Monocytes Absolute: 0.2 10*3/uL (ref 0.1–1.0)
Monocytes Relative: 3 % (ref 3–12)
Neutrophils Relative %: 81 % — ABNORMAL HIGH (ref 43–77)
RBC: 3.75 MIL/uL — ABNORMAL LOW (ref 4.22–5.81)

## 2012-11-19 LAB — COMPREHENSIVE METABOLIC PANEL
ALT: 19 U/L (ref 0–53)
BUN: 18 mg/dL (ref 6–23)
CO2: 16 mEq/L — ABNORMAL LOW (ref 19–32)
Calcium: 7.9 mg/dL — ABNORMAL LOW (ref 8.4–10.5)
GFR calc Af Amer: >90 mL/min (ref >90)
GFR calc non Af Amer: 86 mL/min — ABNORMAL LOW (ref >90)
Glucose, Bld: 127 mg/dL — ABNORMAL HIGH (ref 70–99)
Sodium: 131 mEq/L — ABNORMAL LOW (ref 135–145)

## 2012-11-19 MED ORDER — PROCHLORPERAZINE EDISYLATE 5 MG/ML IJ SOLN
5.0000 mg | Freq: Three times a day (TID) | INTRAMUSCULAR | Status: DC
  Administered 2012-11-19 – 2012-11-27 (×23): 5 mg via INTRAVENOUS
  Filled 2012-11-19 (×10): qty 1
  Filled 2012-11-19: qty 2
  Filled 2012-11-19 (×3): qty 1
  Filled 2012-11-19: qty 2
  Filled 2012-11-19: qty 1
  Filled 2012-11-19: qty 2
  Filled 2012-11-19 (×3): qty 1
  Filled 2012-11-19 (×2): qty 2
  Filled 2012-11-19 (×4): qty 1
  Filled 2012-11-19: qty 2
  Filled 2012-11-19 (×3): qty 1

## 2012-11-19 MED ORDER — TACROLIMUS 5 MG/ML IV SOLN
0.0090 mg/kg/d | INTRAVENOUS | Status: DC
  Administered 2012-11-19 – 2012-11-24 (×6): 0.0312 mg/h via INTRAVENOUS
  Filled 2012-11-19 (×9): qty 1

## 2012-11-19 NOTE — Progress Notes (Signed)
Timothy [name]   DOB:Jan 11, 1946   WU#:[id number]   [id number]#:[id number]  Subjective:Timothy [name] is feeling a bit better but still has had 2 watery BMs already this AM; unable to eat, vomited with clear liquid breakfast today; itching has resolved; walked around ward x2 yesterday; denies mouth pain or soreness, stiffness; rest of ROS stable; wife not in room   Objective: 66 year old white man examined in recliner Filed Vitals:   11/19/12 0520  BP: 106/46  Pulse: 74  Temp: 97.9 F (36.6 C)  Resp: 18    Body mass index is 27.86 kg/(m^2).    Sclerae unicteric; eyelids near normal; face not flushed  Oropharynx shows continuing white flecks c/w GVHD  No peripheral adenopathy  Lungs clear -- no rales or rhonchi, fair excursion bilaterally  Heart regular rate and rhythm, no murmur appreciated  Abdomen soft, NT, +BS  MSK minimal nonpitting bilateral ankle edema; joints flexible  Neuro nonfocal, A&O x3  Skin: the patchy nonconfluent nonpalpable erythematous rash described yesterday is essentially resolved;; the more focal palpable acneiform rash involving Right shoulder and adjacent chest wall is flatter and now slightly scaly  Labs:  Lab Results  Component Value Date   WBC 5.8 11/19/2012   HGB 11.3* 11/19/2012   HCT 31.9* 11/19/2012   MCV 85.1 11/19/2012   PLT 153 11/19/2012   NEUTROABS 4.7 11/19/2012    eos 0% again  Urine Studies No results found for this basename: UACOL:2,UAPR:2,USPG:2,UPH:2,UTP:2,UGL:2,UKET:2,UBIL:2,UHGB:2,UNIT:2,UROB:2,ULEU:2,UEPI:2,UWBC:2,URBC:2,UBAC:2,CAST:2,CRYS:2,UCOM:2,BILUA:2 in the last 72 hours  Basic Metabolic Panel:  Lab 11/19/12 8469 11/18/12 0601 11/17/12 0720 11/16/12 1800  NA 131* 131* 131* 132*  K 4.2 5.0 -- --  CL 106 105 105 105  CO2 16* 14* 16* 16*  GLUCOSE 127* 110* 81 83  BUN 18 19 21 23   CREATININE 0.91 0.92 0.91 0.98  CALCIUM 7.9* 7.5* 7.7* 8.1*  MG 1.8 1.7 1.7 1.5  PHOS -- -- -- 3.3   GFR Estimated Creatinine Clearance: 83.9 ml/min (by C-G  formula based on Cr of 0.91). Liver Function Tests:  Lab 11/19/12 0530 11/18/12 0601 11/17/12 0720 11/16/12 1800  AST 16 22 25 25   ALT 19 22 24 30   ALKPHOS 40 41 38* 44  BILITOT 0.2* 0.2* 0.2* 0.3  PROT 3.8* 3.7* 3.8* 4.4*  ALBUMIN 2.1* 1.9* 2.1* 2.4*   No results found for this basename: LIPASE:5,AMYLASE:5 in the last 168 hours No results found for this basename: AMMONIA:5 in the last 168 hours Coagulation profile  Lab 11/16/12 1800  INR 1.11  PROTIME --    CBC:  Lab 11/19/12 0530 11/18/12 0601 11/17/12 0720 11/16/12 1800  WBC 5.8 5.4 6.1 7.6  NEUTROABS 4.7 4.4 3.6 4.8  HGB 11.3* 11.7* 11.7* 13.3  HCT 31.9* 33.1* 33.4* 37.5*  MCV 85.1 86.9 86.3 86.6  PLT 153 129* 120* 130*   Cardiac Enzymes: No results found for this basename: CKTOTAL:5,CKMB:5,CKMBINDEX:5,TROPONINI:5 in the last 168 hours BNP: No components found with this basename: POCBNP:5 CBG: No results found for this basename: GLUCAP:5 in the last 168 hours D-Dimer No results found for this basename: DDIMER:2 in the last 72 hours Hgb A1c No results found for this basename: HGBA1C:2 in the last 72 hours Lipid Profile No results found for this basename: CHOL:2,HDL:2,LDLCALC:2,TRIG:2,CHOLHDL:2,LDLDIRECT:2 in the last 72 hours Thyroid function studies No results found for this basename: TSH,T4TOTAL,FREET3,T3FREE,THYROIDAB in the last 72 hours Anemia work up No results found for this basename: VITAMINB12:2,FOLATE:2,FERRITIN:2,TIBC:2,IRON:2,RETICCTPCT:2 in the last 72 hours Microbiology Recent Results (from the past 240 hour(s))  CLOSTRIDIUM DIFFICILE  BY PCR     Status: Normal   Collection Time   11/17/12  8:27 PM      Component Value Range Status Comment   C difficile by pcr NEGATIVE  NEGATIVE Final       Studies:  No results found.  Assessment: 66 y.o. [place] man with a history of well-differentiated lymphocytic lymphoma/ chronic lymphoid leukemia initially diagnosed in 2000, not requiring  intervention until 2006; with multiple chromosomal abnormalities; s/p allogeneic transplant March 2013 at "the [facility]", now admitted with GVHD.  His treatment history is as follows:   (1) fludarabine/cyclophosphamide/rituximab x5 completed May 2007.  (2) rituximab for 8 doses October 2010, with partial response  (3) Leustatin and ofatumumab weekly x8 July to September 2011 followed by maintenance ofatumumab maintenance ofatumumab every 2 months, with initial response but rising counts September 2012  (4) status-post unrelated donor stem-cell transplant 02/24/2012 at [facility]  (a) conditioning regimen consisted of fludarabine + TBI at 200 cGy, followed by rituximab x27;  (b) CMV reactivation x3 (patient CMV positive, donor negative), s/p ganciclovir treatment; 3d reactivation August 2013, s/p gancyclovir, with negative PCR mid-September 2013; last gancyclovir dose 10/06/2012  (c) GVHD: involving gut and skin, treated with steroids, tacrolimus and MMF  (d) atrial fibrillation: resolved on amiodarone  (e) steroid-induced myopathy: improving until current crisis (f) hypomagnesemia: resolved with discontinuation of gancyclovir (g) hypogammaglobulinemia: s/p IVIG most recently 10/01/2012; IgG 617 on11/20/2013 (h) elevated triglycerides (606 on 07/14/2012)  (i) adrenal insufficiency: on prednisone  (j) pancytopenia; off growth factors (5) restaging studies September 2013 including CT scans, flow cytometry, and bone marrow biopsy, showed no evidence of residual chronic lymphoid leukemia.  (6) now with a skin rash, mouth changes, severe diarrhea and gastric/duodenal/colonic biopsies 11/27/2013c/w GVHD grade 2  Plan: the low tacrolimus level on admission argues for lack of absorption of oral tacrolimus and likely the prednisone as well; a possible scenario is: (a) tacrolimus taper 3 weeks ago as per protocol leading to (b) mild GVHD leading to (c) decreased absorption of immunosupressants, leading to  (d) worsening GVHD, etc.  Jowan is improving (less rash, more ambulatory) but GVHD is still uncontrolled (mouth changes,watery diarrhea, nausea and vomiting). Will continue solumedrol as ordered but switch the tacrolimus to infusional. Will request level this evening and pharmacy can help [name] adjust the infusion to achieve a therapeutic level. All meds currently IV and continuing clear liquids until he is able to take po's well. Hopefully by Monday we may be able to start switching meds to oral with a view to discharge sometime next week. Full code.  Hedy Garro C 11/19/2012

## 2012-11-19 NOTE — Progress Notes (Signed)
MEDICATION RELATED CONSULT NOTE - INITIAL   Pharmacy Consult for tacrolimus Indication: immunosuppressant therapy s/p stem cell transplant   Allergies  Allergen Reactions  . Benadryl (Diphenhydramine Hcl)     "Restless leg syndrome"    Patient Measurements: Height: 5\' 8"  (172.7 cm) Weight: 183 lb 3.2 oz (83.099 kg) IBW/kg (Calculated) : 68.4  Adjusted Body Weight:   Vital Signs: Temp: 97.9 F (36.6 C) (11/29 0520) Temp src: Oral (11/29 0520) BP: 106/46 mmHg (11/29 0520) Pulse Rate: 74  (11/29 0520) Intake/Output from previous day: 11/28 0701 - 11/29 0700 In: 2969 [P.O.:120; I.V.:2489; IV Piggyback:360] Out: 800 [Urine:800] Intake/Output from this shift: Total I/O In: 806 [P.O.:240; I.V.:566] Out: 400 [Urine:400]  Labs:  [facility] 11/19/12 0530 11/18/12 0601 11/17/12 0720 11/16/12 1800  WBC 5.8 5.4 6.1 --  HGB 11.3* 11.7* 11.7* --  HCT 31.9* 33.1* 33.4* --  PLT 153 129* 120* --  APTT -- -- -- 41*  CREATININE 0.91 0.92 0.91 --  LABCREA -- -- -- --  CREATININE 0.91 0.92 0.91 --  CREAT24HRUR -- -- -- --  MG 1.8 1.7 1.7 --  PHOS -- -- -- 3.3  ALBUMIN 2.1* 1.9* 2.1* --  PROT 3.8* 3.7* 3.8* --  ALBUMIN 2.1* 1.9* 2.1* --  AST 16 22 25  --  ALT 19 22 24  --  ALKPHOS 40 41 38* --  BILITOT 0.2* 0.2* 0.2* --  BILIDIR -- -- -- --  IBILI -- -- -- --   Estimated Creatinine Clearance: 83.9 ml/min (by C-G formula based on Cr of 0.91).   Microbiology: Recent Results (from the past 720 hour(s))  CLOSTRIDIUM DIFFICILE BY PCR     Status: Normal   Collection Time   11/17/12  8:27 PM      Component Value Range Status Comment   C difficile by pcr NEGATIVE  NEGATIVE Final     Medical History: Past Medical History  Diagnosis Date  . 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 Diverticular disease   . Hyperlipidemia   . Obesity   . Hypertension   . Hiatal hernia   . CMV (cytomegalovirus) antibody positive     pre-transplant, with  seroconversion x2 pst-transplant  . Right bundle branch block     pre-transplant  . CKD (chronic kidney disease) stage 2, GFR 60-89 ml/min   . Pancytopenia   . Steroid-induced diabetes   . Atrial fibrillation     post-transplant  . Myopathy   . 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 Leukemia, chronic lymphoid   . Chronic graft-versus-host disease     Medications:  Scheduled:    . acyclovir  400 mg Intravenous Q12H  . antiseptic oral rinse  15 mL Mouth Rinse BID  . enoxaparin (LOVENOX) injection  40 mg Subcutaneous Q24H  . fluconazole (DIFLUCAN) IV  100 mg Intravenous Q24H  . labetalol  200 mg Oral BID  . levofloxacin (LEVAQUIN) IV  500 mg Intravenous Q24H  . lisinopril  10 mg Oral QHS  . methylPREDNISolone (SOLU-MEDROL) injection  60 mg Intravenous Q12H  . multivitamin with minerals  1 tablet Oral Daily  . prochlorperazine  5 mg Intravenous TID AC  . sertraline  100 mg Oral QODAY  . sertraline  50 mg Oral QODAY  . sulfamethoxazole-trimethoprim  160 mg Intravenous Custom  . [DISCONTINUED] dextrose 5 % 250 mL with tacrolimus (PROGRAF) 0.25 mg infusion   Intravenous Q12H  . [DISCONTINUED] sulfamethoxazole-trimethoprim  160 mg Intravenous  Q24H    Assessment: 4 YOM admitted for persistent diarrhea and concern for GVHD as patient s/p allogenic stem cell transplant in March 2013 for CLL (done in [place]).  Per Oncology, tacrolimus was being weaned off when patient developed symptoms of diarrhea, tacrolimus was re-initiated at 1mg  PO BID (his previous dose) but diarrhea continues as well as now has rash and malaise.  When initially present to [facility], tacrolimus level was undetectable thus concern about tacrolimus not being absorbed. He was initiated on tacrolimus at 0.25mg  q12h IV infused over 4hrs (this was per recommendations of physician in [place]).  Several levels have been drawn but remain in process. 11/29 am, Dr. Darnelle Catalan has changed from q12h dosing to continuous  infusion.  Original order entered would have given >4mg  of IV tacrolimus daily  Dr. Twanna Hy has asked pharmacy to assist with dosing  Levels: 11/26 at time of admission: <2 on oral tacrolimus 11/27 at 18:00 (trough): pending (concern about level being drawn from line tacrolimus had been infused) 11/27 at 21:00 (peak): pending (concern about level being drawn from line tacrolimus had been infused) 11/28 at 16:36: pending (should be peripheral stick)    Goal of Therapy:  Goal tacrolimus level = 5-15 ng/ml  Plan:  1. Following d/w Dr. Darnelle Catalan, started continuous infusion to provide 0.75mg /24h (small dose increase from previous intermittent IV dosing) 2. Await results of pending levels 3. Check EKG in am to monitor QTc  [name], [name] 11/19/2012,12:20 PM

## 2012-11-19 NOTE — Progress Notes (Signed)
INITIAL ADULT NUTRITION ASSESSMENT Date: 11/19/2012   Time: 11:43 AM Reason for Assessment: Nutrition risk   INTERVENTION: Diet advancement per MD. Pt may benefit from appetite stimulant once diarrhea resolves - discussed with oncologist. Will monitor.   Pt meets criteria for severe malnutrition of chronic illness AEB <75% estimated energy intake for at least the past month with 33.4% weight loss in the past 9 months with 12.8% of that weight loss in the past 2 months.   ASSESSMENT: Male 66 y.o.  Dx: Diarrhea  Food/Nutrition Related Hx: Pt with chronic lymphoid leukemia s/p allogenic transplant and various chemotherapy treatments. Pt known to RD from previous admission in September of this year. Pt's weight down 27 pounds unintentionally since then and down 92 pounds since February of this year. Pt reports no appetite for the past several weeks however pt tries to eat 2-3 meals/day of a balanced diet and drinks 1 Ensure/day. Pt reports food no longer appeals to him. Pt admitted for several days of diarrhea. Pt reports he was having nausea and had some vomiting after clear liquid breakfast this morning, however currently his nausea is resolved. Pt with diarrhea this morning.   EGD on 11/27 showed: 1. Minimal distal esophagitis with questionable very short  segment Barrett's status post biopsy  2. Minimal gastritis antritis status post biopsy  3. Otherwise within normal limits to probably the fourth portion of  the duodenum status post duodenal and proximal and mid esophageal  biopsies as well   Sigmoidoscopy on 11/27 showed: The colonic mucosa appeared normal in the sigmoid  colon and rectum and a few random biopsies were obtained  Retroflexed views revealed internal hemorrhoid.   Hx:  Past Medical History  Diagnosis Date  . 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 Diverticular disease   . Hyperlipidemia   . Obesity   . Hypertension   . Hiatal  hernia   . CMV (cytomegalovirus) antibody positive     pre-transplant, with seroconversion x2 pst-transplant  . Right bundle branch block     pre-transplant  . CKD (chronic kidney disease) stage 2, GFR 60-89 ml/min   . Pancytopenia   . Steroid-induced diabetes   . Atrial fibrillation     post-transplant  . Myopathy   . 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 Leukemia, chronic lymphoid   . Chronic graft-versus-host disease    Related Meds:  Scheduled Meds:   . acyclovir  400 mg Intravenous Q12H  . antiseptic oral rinse  15 mL Mouth Rinse BID  . enoxaparin (LOVENOX) injection  40 mg Subcutaneous Q24H  . fluconazole (DIFLUCAN) IV  100 mg Intravenous Q24H  . labetalol  200 mg Oral BID  . levofloxacin (LEVAQUIN) IV  500 mg Intravenous Q24H  . lisinopril  10 mg Oral QHS  . methylPREDNISolone (SOLU-MEDROL) injection  60 mg Intravenous Q12H  . multivitamin with minerals  1 tablet Oral Daily  . prochlorperazine  5 mg Intravenous TID AC  . sertraline  100 mg Oral QODAY  . sertraline  50 mg Oral QODAY  . sulfamethoxazole-trimethoprim  160 mg Intravenous Custom  . [DISCONTINUED] dextrose 5 % 250 mL with tacrolimus (PROGRAF) 0.25 mg infusion   Intravenous Q12H  . [DISCONTINUED] sulfamethoxazole-trimethoprim  160 mg Intravenous Q24H   Continuous Infusions:   . sodium chloride 100 mL/hr at 11/19/12 1043  . tacrolimus (PROGRAF) IV     PRN Meds:.acetaminophen, acetaminophen, chlorhexidine, diphenoxylate-atropine, LORazepam, meclizine, ondansetron (ZOFRAN) IV,  oxyCODONE, prochlorperazine, zolpidem  Ht: 5\' 8"  (172.7 cm)  Wt: 183 lb 3.2 oz (83.099 kg)  Ideal Wt: 154 lb % Ideal Wt: 119  Usual Wt: 275 lb in February 2013 per pt report  % Usual Wt: 66  Body mass index is 27.86 kg/(m^2).   Labs: CMP     Component Value Date/Time   NA 131* 11/19/2012 0530   NA 131* 11/10/2012 1410   K 4.2 11/19/2012 0530   K 4.6 11/10/2012 1410   CL 106 11/19/2012 0530   CL 104  11/10/2012 1410   CO2 16* 11/19/2012 0530   CO2 22 11/10/2012 1410   GLUCOSE 127* 11/19/2012 0530   GLUCOSE 93 11/10/2012 1410   BUN 18 11/19/2012 0530   BUN 20.0 11/10/2012 1410   CREATININE 0.91 11/19/2012 0530   CREATININE 1.1 11/10/2012 1410   CALCIUM 7.9* 11/19/2012 0530   CALCIUM 8.9 11/10/2012 1410   PROT 3.8* 11/19/2012 0530   PROT 5.3* 11/10/2012 1410   ALBUMIN 2.1* 11/19/2012 0530   ALBUMIN 3.0* 11/10/2012 1410   AST 16 11/19/2012 0530   AST 44* 11/10/2012 1410   ALT 19 11/19/2012 0530   ALT 39 11/10/2012 1410   ALKPHOS 40 11/19/2012 0530   ALKPHOS 67 11/10/2012 1410   BILITOT 0.2* 11/19/2012 0530   BILITOT 0.35 11/10/2012 1410   GFRNONAA 86* 11/19/2012 0530   GFRAA >90 11/19/2012 0530    Intake/Output Summary (Last 24 hours) at 11/19/12 1321 Last data filed at 11/19/12 1100  Gross per 24 hour  Intake   3675 ml  Output   1000 ml  Net   2675 ml   Last BM - 11/29  Diet Order: Clear Liquid    IVF:    sodium chloride Last Rate: 100 mL/hr at 11/19/12 1043  tacrolimus (PROGRAF) IV     Estimated Nutritional Needs:   Kcal: 1750-2100 Protein: 85-105g Fluid: 1.7-2.1L  NUTRITION DIAGNOSIS: -Altered GI function (NI-1.4).  Status: Ongoing  RELATED TO: diarrhea  AS EVIDENCE BY: H&P  MONITORING/EVALUATION(Goals): 1. Resolution of diarrhea 2. Advance diet as tolerated to regular diet  EDUCATION NEEDS: -No education needs identified at this time   Dietitian #: [phone]  DOCUMENTATION CODES Per approved criteria  -Severe malnutrition in the context of chronic illness    Marshall Cork 11/19/2012, 11:43 AM

## 2012-11-20 DIAGNOSIS — D89813 Graft-versus-host disease, unspecified: Secondary | ICD-10-CM

## 2012-11-20 DIAGNOSIS — R197 Diarrhea, unspecified: Secondary | ICD-10-CM

## 2012-11-20 DIAGNOSIS — C9110 Chronic lymphocytic leukemia of B-cell type not having achieved remission: Secondary | ICD-10-CM

## 2012-11-20 DIAGNOSIS — R110 Nausea: Secondary | ICD-10-CM

## 2012-11-20 LAB — COMPREHENSIVE METABOLIC PANEL
Alkaline Phosphatase: 38 U/L — ABNORMAL LOW (ref 39–117)
BUN: 17 mg/dL (ref 6–23)
GFR calc Af Amer: >90 mL/min (ref >90)
GFR calc non Af Amer: 87 mL/min — ABNORMAL LOW (ref >90)
Glucose, Bld: 122 mg/dL — ABNORMAL HIGH (ref 70–99)
Potassium: 4 mEq/L (ref 3.5–5.1)
Total Bilirubin: 0.2 mg/dL — ABNORMAL LOW (ref 0.3–1.2)
Total Protein: 3.6 g/dL — ABNORMAL LOW (ref 6.0–8.3)

## 2012-11-20 LAB — CBC WITH DIFFERENTIAL/PLATELET
Basophils Relative: 0 % (ref 0–1)
HCT: 31.3 % — ABNORMAL LOW (ref 39.0–52.0)
Hemoglobin: 11.1 g/dL — ABNORMAL LOW (ref 13.0–17.0)
MCH: 30.4 pg (ref 26.0–34.0)
MCHC: 35.5 g/dL (ref 30.0–36.0)
Monocytes Absolute: 0.2 10*3/uL (ref 0.1–1.0)
Monocytes Relative: 4 % (ref 3–12)
Neutro Abs: 4 10*3/uL (ref 1.7–7.7)

## 2012-11-20 MED ORDER — WITCH HAZEL-GLYCERIN EX PADS
MEDICATED_PAD | CUTANEOUS | Status: DC | PRN
  Administered 2012-11-20: 12:00:00 via TOPICAL
  Filled 2012-11-20: qty 100

## 2012-11-20 NOTE — Progress Notes (Signed)
Sitz bath and tucks pads given to patient. Sitz bath set up in the bathroom and patient educated on how to use it. This RN told patient to call for any assistance needed. Angelena Form, RN

## 2012-11-20 NOTE — Progress Notes (Signed)
Please note that from the endoscopic biopsies, The findings from the duodenal, stomach, esophageal and colorectal biopsies favor graft versus host disease and in this setting the severity is up to a Marsh Grade II.  Please call if further GI input needed.

## 2012-11-20 NOTE — Progress Notes (Signed)
  11/20/2012, 10:10 AM  Hospital day: 5 Acyclovir, diflucan, bactrim   Patient seen, on call for Dr Darnelle Catalan; spoke with RN also.  Subjective: Diarrhea seems unchanged, twice during night and once already this am, watery but not large amounts "completely unpredictable'. No bleeding, but has hemorrhoid. Now tolerating clear liquids without nausea and would like to go to full liquids. Not short of breath including when up to BR. No pain. Did sleep some. Mouth not sore but a little sensitive. Per patient, skin rash better today.  Objective: Vital signs in last 24 hours: Blood pressure 126/72, pulse 74, temperature 97.9 F (36.6 C), temperature source Oral, resp. rate 18, height 5\' 8"  (1.727 m), weight 185 lb 8 oz (84.142 kg), SpO2 95.00%. IV NS at 100  Intake/Output from previous day: 11/29 0701 - 11/30 0700 In: 3248 [P.O.:780; I.V.:2468] Out: 1025 [Urine:1025] Intake/Output this shift:    Physical exam: awake, alert, NAD in bed on RA, very pleasant. PERRL, not icteric. Oral mucosa moist, minimal whitish patches on lateral tongue not thrush. Lungs without wheezes or crackles. Hickman catheter right anterior chest infusing without difficulty, no erythema or tenderness at insertion. Heart RRR now. Abdomen active bowel sounds, soft, not tender. LE no pitting edema, cords, tenderness, feet warm. Moves in bed without assistance.Minimal patchy erythematous rash nonpalpable right anterior shoulder, not really appreciable now on face.  Lab Results:  Basename 11/20/12 0630 11/19/12 0530  WBC 4.9 5.8  HGB 11.1* 11.3*  HCT 31.3* 31.9*  PLT 149* 153   BMET  Basename 11/20/12 0630 11/19/12 0530  NA 133* 131*  K 4.0 4.2  CL 108 106  CO2 15* 16*  GLUCOSE 122* 127*  BUN 17 18  CREATININE 0.90 0.91  CALCIUM 7.9* 7.9*   Tacrolimus level pending, pharmacy following C diff negative 11-17-12  Studies/Results: EKG done, QTc 486, c/w 467 on 11-26 Path from GI biopsies  noted  Assessment/Plan: 1.WDLL/ CLL post allogenetic transplant March 2013, now with GVHD. Rash and nausea much better, diarrhea continuing. Continue tacrolimus continuous infusion per pharmacy, IV solumedrol and IV NS. Follow labs daily. Advance to full liquids. Appreciate GI assistance this admission. Not stable for DC yet. 2.Hickman catheter in since March 2013 3.hemorrhoid: begin sitz baths, prn Tucks, barrier cream prn. 4. Steroid myopathy, hx CMV reactivation, last IVIG 10-01-12, adrenal insufficiency   Mandee Pluta P

## 2012-11-21 DIAGNOSIS — D89811 Chronic graft-versus-host disease: Secondary | ICD-10-CM

## 2012-11-21 LAB — COMPREHENSIVE METABOLIC PANEL
AST: 14 U/L (ref 0–37)
Albumin: 2 g/dL — ABNORMAL LOW (ref 3.5–5.2)
Alkaline Phosphatase: 39 U/L (ref 39–117)
Chloride: 107 mEq/L (ref 96–112)
Potassium: 3.5 mEq/L (ref 3.5–5.1)
Sodium: 131 mEq/L — ABNORMAL LOW (ref 135–145)
Total Bilirubin: 0.3 mg/dL (ref 0.3–1.2)

## 2012-11-21 LAB — CBC WITH DIFFERENTIAL/PLATELET
Basophils Absolute: 0 10*3/uL (ref 0.0–0.1)
HCT: 32 % — ABNORMAL LOW (ref 39.0–52.0)
Lymphocytes Relative: 13 % (ref 12–46)
Neutro Abs: 4.9 10*3/uL (ref 1.7–7.7)
Neutrophils Relative %: 83 % — ABNORMAL HIGH (ref 43–77)
Platelets: 157 10*3/uL (ref 150–400)
RDW: 15.7 % — ABNORMAL HIGH (ref 11.5–15.5)
WBC: 5.9 10*3/uL (ref 4.0–10.5)

## 2012-11-21 LAB — MAGNESIUM: Magnesium: 1.6 mg/dL (ref 1.5–2.5)

## 2012-11-21 MED ORDER — LOPERAMIDE HCL 2 MG PO CAPS
2.0000 mg | ORAL_CAPSULE | ORAL | Status: DC | PRN
  Administered 2012-11-21 – 2012-11-26 (×11): 2 mg via ORAL
  Administered 2012-11-26 – 2012-11-27 (×2): 4 mg via ORAL
  Administered 2012-11-27 – 2012-12-03 (×17): 2 mg via ORAL
  Filled 2012-11-21: qty 2
  Filled 2012-11-21 (×2): qty 1
  Filled 2012-11-21: qty 2
  Filled 2012-11-21 (×2): qty 1
  Filled 2012-11-21 (×2): qty 2
  Filled 2012-11-21 (×5): qty 1
  Filled 2012-11-21: qty 2
  Filled 2012-11-21 (×6): qty 1
  Filled 2012-11-21: qty 2
  Filled 2012-11-21: qty 1
  Filled 2012-11-21: qty 2
  Filled 2012-11-21 (×2): qty 1
  Filled 2012-11-21: qty 2
  Filled 2012-11-21: qty 1
  Filled 2012-11-21: qty 2

## 2012-11-21 NOTE — Progress Notes (Signed)
SUBJECTIVE:  Patient reports that diarrhea continues.  He is eating well.  He is afebrile. He does not report pain.    MEDICATIONS:  Scheduled:   . acyclovir  400 mg Intravenous Q12H  . antiseptic oral rinse  15 mL Mouth Rinse BID  . enoxaparin (LOVENOX) injection  40 mg Subcutaneous Q24H  . fluconazole (DIFLUCAN) IV  100 mg Intravenous Q24H  . labetalol  200 mg Oral BID  . levofloxacin (LEVAQUIN) IV  500 mg Intravenous Q24H  . lisinopril  10 mg Oral QHS  . methylPREDNISolone (SOLU-MEDROL) injection  60 mg Intravenous Q12H  . multivitamin with minerals  1 tablet Oral Daily  . prochlorperazine  5 mg Intravenous TID AC  . sertraline  100 mg Oral QODAY  . sertraline  50 mg Oral QODAY  . sulfamethoxazole-trimethoprim  160 mg Intravenous Custom   Continuous:   . sodium chloride 100 mL/hr at 11/20/12 2258  . tacrolimus (PROGRAF) IV 0.0312 mg/hr (11/20/12 1821)   ZOX:[id number], acetaminophen, chlorhexidine, diphenoxylate-atropine, LORazepam, meclizine, ondansetron (ZOFRAN) IV, oxyCODONE, prochlorperazine, witch hazel-glycerin, zolpidem   PHYSICAL EXAM  Vital signs in last 24 hours: Temp:  [97.3 F (36.3 C)-97.5 F (36.4 C)] 97.3 F (36.3 C) (12/01 0536) Pulse Rate:  [69-75] 69  (12/01 1024) Resp:  [18] 18  (12/01 0536) BP: (136-153)/(64-88) 148/75 mmHg (12/01 1024) SpO2:  [96 %-98 %] 98 % (12/01 0536)  Intake/Output from previous day: 11/30 0701 - 12/01 0700 In: 3242.4 [P.O.:1140; I.V.:2102.4] Out: 425 [Urine:425] Intake/Output this shift: Total I/O In: 240 [P.O.:240] Out: -   General appearance: alert and cooperative Sclera - ecchmosis Resp: clear to auscultation bilaterally and normal percussion bilaterally Cardio: regular rate and rhythm, S1, S2 normal, no murmur, click, rub or gallop GI: soft, non-tender; bowel sounds normal; no masses,  no organomegaly Extremities: extremities normal, atraumatic, no cyanosis or edema   LABS:  CBC    Component Value  Date/Time   WBC 5.9 11/21/2012 0450   WBC 18.3* 11/10/2012 1410   RBC 3.73* 11/21/2012 0450   RBC 4.50 11/10/2012 1410   HGB 11.3* 11/21/2012 0450   HGB 14.1 11/10/2012 1410   HCT 32.0* 11/21/2012 0450   HCT 42.1 11/10/2012 1410   PLT 157 11/21/2012 0450   PLT 122* 11/10/2012 1410   MCV 85.8 11/21/2012 0450   MCV 93.6 11/10/2012 1410   MCH 30.3 11/21/2012 0450   MCH 31.4 11/10/2012 1410   MCHC 35.3 11/21/2012 0450   MCHC 33.5 11/10/2012 1410   RDW 15.7* 11/21/2012 0450   RDW 15.7* 11/10/2012 1410   LYMPHSABS 0.8 11/21/2012 0450   LYMPHSABS 1.6 11/10/2012 1410   MONOABS 0.3 11/21/2012 0450   MONOABS 0.8 11/10/2012 1410   EOSABS 0.0 11/21/2012 0450   EOSABS 0.0 11/10/2012 1410   BASOSABS 0.0 11/21/2012 0450   BASOSABS 0.0 11/10/2012 1410     Basename 11/21/12 0450 11/20/12 0630  NA 131* 133*  K 3.5 4.0  CL 107 108  CO2 16* 15*  GLUCOSE 122* 122*  BUN 16 17  CREATININE 0.86 0.90  CALCIUM 7.6* 7.9*     ASSESSMENT and PLAN: 1. CLL s/p allogeneic transplant complicated by graft versus host disease.  Continues with steroids and tacrolimus. Pharmacy assisting with maintaining therapeutic levels 2. Diarrhea.  Seen by GI.  Endoscopy biopsies in keeping with graft versus host disease.  Imoduim has worked for the patient in the past.  Pt will begin imodium. 3.  Increase ambulation with assistance.  Arlan Organ I., MD 11/21/2012

## 2012-11-21 NOTE — Progress Notes (Signed)
Tacrolimus Monitoring  66 yom with CLL s/p allogeneic transplant in [place] March 2013 now complicated by graft vs host disease.  Patient was being tapered down on tacrolimus prior to admission to dose of 1gm po q12h.  MD from [facility] recommended to administer tacrolimus 0.25 mg IV q12h (over 4 hr infusion) on admit. This was changed on 11/29 to a dose of 0.75 mg continuous infusion over 24 hours. A steady state tacrolimus level drawn on 11/28 came back 12/1 AM = 2.9.  Note this is the level prior to the dose increase.  Currently pending is a level from 12/1 AM (steady state on current regimen).  Pharmacy will f/u this level and adjust tacrolimus to maintain therapeutic goal of 5-15 ng/mL.   Thanks  Geoffry Paradise, PharmD, BCPS Pager: [phone] 1:23 PM Pharmacy #: [id number]

## 2012-11-21 NOTE — Progress Notes (Signed)
Pt still experiencing diarrhea, has had 6 episodes. He has received immodium x2. OOB ambulating in the hall x2 with wife,tol well. He circled the unit and Palliative twice. Appetite good. Urine has been mixed with stool.

## 2012-11-22 ENCOUNTER — Encounter (HOSPITAL_COMMUNITY): Payer: Self-pay

## 2012-11-22 ENCOUNTER — Encounter (HOSPITAL_COMMUNITY): Payer: Self-pay | Admitting: Gastroenterology

## 2012-11-22 LAB — CBC WITH DIFFERENTIAL/PLATELET
Basophils Absolute: 0 10*3/uL (ref 0.0–0.1)
Eosinophils Relative: 0 % (ref 0–5)
HCT: 31.5 % — ABNORMAL LOW (ref 39.0–52.0)
Hemoglobin: 11.2 g/dL — ABNORMAL LOW (ref 13.0–17.0)
Lymphocytes Relative: 19 % (ref 12–46)
MCHC: 35.6 g/dL (ref 30.0–36.0)
MCV: 85.4 fL (ref 78.0–100.0)
Monocytes Absolute: 0.4 10*3/uL (ref 0.1–1.0)
Monocytes Relative: 8 % (ref 3–12)
RDW: 15.7 % — ABNORMAL HIGH (ref 11.5–15.5)
WBC: 4.2 10*3/uL (ref 4.0–10.5)

## 2012-11-22 LAB — COMPREHENSIVE METABOLIC PANEL
Albumin: 1.9 g/dL — ABNORMAL LOW (ref 3.5–5.2)
Alkaline Phosphatase: 39 U/L (ref 39–117)
BUN: 17 mg/dL (ref 6–23)
Chloride: 108 mEq/L (ref 96–112)
Creatinine, Ser: 0.85 mg/dL (ref 0.50–1.35)
GFR calc Af Amer: >90 mL/min (ref >90)
GFR calc non Af Amer: 89 mL/min — ABNORMAL LOW (ref >90)
Glucose, Bld: 133 mg/dL — ABNORMAL HIGH (ref 70–99)
Total Bilirubin: 0.3 mg/dL (ref 0.3–1.2)

## 2012-11-22 NOTE — Progress Notes (Addendum)
Tacrolimus Monitoring  66 yom with CLL s/p allogeneic transplant in [place] March 2013 now complicated by graft vs host disease.  Patient was being tapered down on tacrolimus prior to admission to dose of 1gm po q12h.  MD from [facility] recommended to administer tacrolimus 0.25 mg IV q12h (over 4 hr infusion) on admit. This was changed on 11/29 to a dose of 0.75 mg continuous infusion over 24 hours. A steady state tacrolimus level drawn on 11/28 came back 12/1 AM = 2.9.  Note this is the level prior to the dose increase.  Patient on fluconazole - this will increase tacrolimus level, must be adjusted if fluconazole is d/ced.   Goal Level = 5-15 ng/mL  Today tacrolimus level = 13.3 Level appears to have been drawn correctly (lab draw from peripheral stick)  Plan: 1. Recommend continuing current dose of 0.75 mg continuous infusion 2. Would draw another level either Wednesday or Thursday to confirm 3. Monitor for signs/symptoms of toxicity 4. Defer to MD about the potential of patient going back on oral tacrolimus since patient may have been having issues absorbing (FYI equivalent oral dose would be 0.75 mg BID)   Thank you,  Tammy Sours, Pharm.D. Clinical Oncology Pharmacist  Pager # [phone]  11/22/2012 3:27 PM Pharmacy #: [id number]

## 2012-11-22 NOTE — Progress Notes (Signed)
Timothy [name]   DOB:19-Nov-1946   JX#:[id number]   OZH#:[id number]  Subjective: weekend was relatively uneventful; still has 7 BMs yesterday and still watery, but much less voluminous; only one BM (2:30 AM) past 12 hours; taking immodium with some success; diet advanced to full liquids and as of this AM to regular; no nausea or vomiting; mo mouth sores or sore throat; feels rash is better; ambulated x 2 yesterday with much less fatigue; no family in room   Objective: middle aged white man examined in bed (eating breakfast) Filed Vitals:   11/22/12 0515  BP: 141/64  Pulse: 70  Temp: 98.1 F (36.7 C)  Resp: 16    Body mass index is 29.13 kg/(m^2).    Sclerae unicteric; eyelids near normal; face not flushed  Oropharynx now clear  No peripheral adenopathy  Lungs clear -- no rales or rhonchi, fair excursion bilaterally  Heart regular rate and rhythm, no murmur appreciated  Abdomen soft, NT, +BS  MSK minimal nonpitting bilateral ankle edema; joints flexible  Neuro nonfocal, A&O x3  Skin: the patchy nonconfluent nonpalpable erythematous rash previously described yesterday has resolved;; the more focal palpable acneiform rash involving Right shoulder and adjacent chest wall is now not palpable and minimally noticeable, only a few faint splotches noted   Labs:   Tacrolimus level from 11/21/2012 still pending  Absolute eos 0, abs. Monos 0.4, abs lymphs 0.8  Lab Results  Component Value Date   WBC 4.2 11/22/2012   HGB 11.2* 11/22/2012   HCT 31.5* 11/22/2012   MCV 85.4 11/22/2012   PLT 149* 11/22/2012   NEUTROABS 3.0 11/22/2012    Urine Studies No results found for this basename: UACOL:2,UAPR:2,USPG:2,UPH:2,UTP:2,UGL:2,UKET:2,UBIL:2,UHGB:2,UNIT:2,UROB:2,ULEU:2,UEPI:2,UWBC:2,URBC:2,UBAC:2,CAST:2,CRYS:2,UCOM:2,BILUA:2 in the last 72 hours  Basic Metabolic Panel:  Lab 11/22/12 6295 11/21/12 0450 11/20/12 0630 11/19/12 0530 11/18/12 0601 11/16/12 1800  NA 132* 131* 133* 131* 131* --  K 3.6  3.5 -- -- -- --  CL 108 107 108 106 105 --  CO2 17* 16* 15* 16* 14* --  GLUCOSE 133* 122* 122* 127* 110* --  BUN 17 16 17 18 19  --  CREATININE 0.85 0.86 0.90 0.91 0.92 --  CALCIUM 7.6* 7.6* 7.9* 7.9* 7.5* --  MG 1.7 1.6 1.8 1.8 1.7 --  PHOS -- -- -- -- -- 3.3   GFR Estimated Creatinine Clearance: 91.7 ml/min (by C-G formula based on Cr of 0.85). Liver Function Tests:  Lab 11/22/12 0530 11/21/12 0450 11/20/12 0630 11/19/12 0530 11/18/12 0601  AST 14 14 14 16 22   ALT 17 16 18 19 22   ALKPHOS 39 39 38* 40 41  BILITOT 0.3 0.3 0.2* 0.2* 0.2*  PROT 3.3* 3.5* 3.6* 3.8* 3.7*  ALBUMIN 1.9* 2.0* 2.1* 2.1* 1.9*   No results found for this basename: LIPASE:5,AMYLASE:5 in the last 168 hours No results found for this basename: AMMONIA:5 in the last 168 hours Coagulation profile  Lab 11/16/12 1800  INR 1.11  PROTIME --    CBC:  Lab 11/22/12 0530 11/21/12 0450 11/20/12 0630 11/19/12 0530 11/18/12 0601  WBC 4.2 5.9 4.9 5.8 5.4  NEUTROABS 3.0 4.9 4.0 4.7 4.4  HGB 11.2* 11.3* 11.1* 11.3* 11.7*  HCT 31.5* 32.0* 31.3* 31.9* 33.1*  MCV 85.4 85.8 85.8 85.1 86.9  PLT 149* 157 149* 153 129*   Cardiac Enzymes: No results found for this basename: CKTOTAL:5,CKMB:5,CKMBINDEX:5,TROPONINI:5 in the last 168 hours BNP: No components found with this basename: POCBNP:5 CBG: No results found for this basename: GLUCAP:5 in the last 168 hours D-Dimer  No results found for this basename: DDIMER:2 in the last 72 hours Hgb A1c No results found for this basename: HGBA1C:2 in the last 72 hours Lipid Profile No results found for this basename: CHOL:2,HDL:2,LDLCALC:2,TRIG:2,CHOLHDL:2,LDLDIRECT:2 in the last 72 hours Thyroid function studies No results found for this basename: TSH,T4TOTAL,FREET3,T3FREE,THYROIDAB in the last 72 hours Anemia work up No results found for this basename: VITAMINB12:2,FOLATE:2,FERRITIN:2,TIBC:2,IRON:2,RETICCTPCT:2 in the last 72 hours Microbiology Recent Results (from the past  240 hour(s))  CLOSTRIDIUM DIFFICILE BY PCR     Status: Normal   Collection Time   11/17/12  8:27 PM      Component Value Range Status Comment   C difficile by pcr NEGATIVE  NEGATIVE Final       Studies:  No results found.  Assessment: 66 y.o. [place] man with a history of well-differentiated lymphocytic lymphoma/ chronic lymphoid leukemia initially diagnosed in 2000, not requiring intervention until 2006; with multiple chromosomal abnormalities; s/p allogeneic transplant March 2013 at "the [facility]", now admitted with GVHD.  His treatment history is as follows:   (1) fludarabine/cyclophosphamide/rituximab x5 completed May 2007.  (2) rituximab for 8 doses October 2010, with partial response  (3) Leustatin and ofatumumab weekly x8 July to September 2011 followed by maintenance ofatumumab maintenance ofatumumab every 2 months, with initial response but rising counts September 2012  (4) status-post unrelated donor stem-cell transplant 02/24/2012 at the [facility]  (a) conditioning regimen consisted of fludarabine + TBI at 200 cGy, followed by rituximab x27;  (b) CMV reactivation x3 (patient CMV positive, donor negative), s/p ganciclovir treatment; 3d reactivation August 2013, s/p gancyclovir, with negative PCR mid-September 2013; last gancyclovir dose 10/06/2012  (c) GVHD: involving gut and skin, treated with steroids, tacrolimus and MMF  (d) atrial fibrillation: resolved on amiodarone  (e) steroid-induced myopathy: improving until current crisis (f) hypomagnesemia: resolved with discontinuation of gancyclovir (g) hypogammaglobulinemia: s/p IVIG most recently 10/01/2012; IgG 617 on11/20/2013 (h) elevated triglycerides (606 on 07/14/2012)  (i) adrenal insufficiency: on prednisone  (j) pancytopenia; off growth factors (5) restaging studies September 2013 including CT scans, flow cytometry, and bone marrow biopsy, showed no evidence of residual chronic lymphoid leukemia.  (6) now with a skin  rash, mouth changes, severe diarrhea and gastric/duodenal/colonic biopsies 11/17/2012 c/w GVHD grade 2  Plan: he is improving though the GI system is still not functioning normally; tacrolimus level is pending; have advanced diet and encouraged use of imodium/ lomotil,also OOBTC and to ambulate; currently all significant meds IV; if GI issues resolve will start moving meds to po with a view to discharge home later this week; full code  MAGRINAT,GUSTAV C 11/22/2012

## 2012-11-23 LAB — CBC WITH DIFFERENTIAL/PLATELET
Basophils Absolute: 0 10*3/uL (ref 0.0–0.1)
Lymphocytes Relative: 15 % (ref 12–46)
Lymphs Abs: 0.8 10*3/uL (ref 0.7–4.0)
MCV: 85.4 fL (ref 78.0–100.0)
Neutro Abs: 4.4 10*3/uL (ref 1.7–7.7)
Neutrophils Relative %: 79 % — ABNORMAL HIGH (ref 43–77)
Platelets: 126 10*3/uL — ABNORMAL LOW (ref 150–400)
RBC: 3.78 MIL/uL — ABNORMAL LOW (ref 4.22–5.81)
RDW: 15.7 % — ABNORMAL HIGH (ref 11.5–15.5)
WBC: 5.6 10*3/uL (ref 4.0–10.5)

## 2012-11-23 LAB — COMPREHENSIVE METABOLIC PANEL
Alkaline Phosphatase: 37 U/L — ABNORMAL LOW (ref 39–117)
CO2: 17 mEq/L — ABNORMAL LOW (ref 19–32)
Calcium: 7.5 mg/dL — ABNORMAL LOW (ref 8.4–10.5)
Chloride: 110 mEq/L (ref 96–112)
Creatinine, Ser: 0.82 mg/dL (ref 0.50–1.35)
GFR calc Af Amer: >90 mL/min (ref >90)
Potassium: 3.7 mEq/L (ref 3.5–5.1)
Sodium: 135 mEq/L (ref 135–145)
Total Protein: 3.2 g/dL — ABNORMAL LOW (ref 6.0–8.3)

## 2012-11-23 LAB — MAGNESIUM: Magnesium: 1.7 mg/dL (ref 1.5–2.5)

## 2012-11-23 NOTE — Progress Notes (Signed)
Pt reports better control over bowels this shift. Pt reports no incontinent episodes and has not had a BM since 2200 on 12/2, after which he received 2mg  Imodium.

## 2012-11-23 NOTE — Progress Notes (Signed)
Patient has ambulated outside of the room in the hallways twice today. He has tolerated it well. Will continue to encourage ambulation and monitor patient. Angelena Form, RN

## 2012-11-23 NOTE — Progress Notes (Signed)
Timothy [name]   DOB:18-Feb-1946   ZO#:[id number]   JWJ#:[id number]  Subjective: .BMs down to 5 yesterday, more scant; he has been soiling, but yesterday had more control; because of the incontinence he is reluctant to walk the halls; no mouth soreness, N or V; ate a regular diet yesterday; no family in room   Objective: middle aged white man examined in bed  Filed Vitals:   11/23/12 0500  BP: 130/66  Pulse: 75  Temp: 97.9 F (36.6 [name])  Resp: 18    Body mass index is 29.13 kg/(m^2).    Sclerae unicteric; face slightly more flushed this AM  Oropharynx shows minimal whitish coating on tongue  No peripheral adenopathy  Lungs clear -- no rales or rhonchi, fair excursion bilaterally  Heart regular rate and rhythm, no murmur appreciated  Abdomen soft, NT, +BS  MSK minimal nonpitting bilateral ankle edema; joints flexible  Neuro nonfocal, A&O x3  Skin: the patchy nonconfluent nonpalpable erythematous rash previously described yesterday has resolved;; the more focal palpable acneiform rash involving Right shoulder and adjacent chest wall is now not palpable but still noticeable, perhaps a little more than yesterday  Labs:  Results for Timothy [name], Timothy [name] (MRN [id number]) as of 11/23/2012 08:26  Ref. Range 08/04/2012 14:45 08/06/2012 15:10 08/11/2012 09:17 11/16/2012 18:00 11/21/2012 04:50  Tacrolimus Lvl Latest Range: 5.0-20.0 ng/mL 9.0 6.9 6.9 <2.0 (L) 13.3     Absolute eos 0, abs. Monos 0.4, abs lymphs 0.8  Lab Results  Component Value Date   WBC 5.6 11/23/2012   HGB 11.4* 11/23/2012   HCT 32.3* 11/23/2012   MCV 85.4 11/23/2012   PLT 126* 11/23/2012   NEUTROABS 4.4 11/23/2012    Urine Studies No results found for this basename: UACOL:2,UAPR:2,USPG:2,UPH:2,UTP:2,UGL:2,UKET:2,UBIL:2,UHGB:2,UNIT:2,UROB:2,ULEU:2,UEPI:2,UWBC:2,URBC:2,UBAC:2,CAST:2,CRYS:2,UCOM:2,BILUA:2 in the last 72 hours  Basic Metabolic Panel:  Lab 11/23/12 8469 11/22/12 0530 11/21/12 0450 11/20/12 0630 11/19/12 0530 11/16/12 1800   NA 135 132* 131* 133* 131* --  K 3.7 3.6 -- -- -- --  CL 110 108 107 108 106 --  CO2 17* 17* 16* 15* 16* --  GLUCOSE 131* 133* 122* 122* 127* --  BUN 18 17 16 17 18  --  CREATININE 0.82 0.85 0.86 0.90 0.91 --  CALCIUM 7.5* 7.6* 7.6* 7.9* 7.9* --  MG 1.7 1.7 1.6 1.8 1.8 --  PHOS -- -- -- -- -- 3.3   GFR Estimated Creatinine Clearance: 95 ml/min (by [name]-G formula based on Cr of 0.82). Liver Function Tests:  Lab 11/23/12 0530 11/22/12 0530 11/21/12 0450 11/20/12 0630 11/19/12 0530  AST 18 14 14 14 16   ALT 19 17 16 18 19   ALKPHOS 37* 39 39 38* 40  BILITOT 0.3 0.3 0.3 0.2* 0.2*  PROT 3.2* 3.3* 3.5* 3.6* 3.8*  ALBUMIN 1.8* 1.9* 2.0* 2.1* 2.1*   No results found for this basename: LIPASE:5,AMYLASE:5 in the last 168 hours No results found for this basename: AMMONIA:5 in the last 168 hours Coagulation profile  Lab 11/16/12 1800  INR 1.11  PROTIME --    CBC:  Lab 11/23/12 0530 11/22/12 0530 11/21/12 0450 11/20/12 0630 11/19/12 0530  WBC 5.6 4.2 5.9 4.9 5.8  NEUTROABS 4.4 3.0 4.9 4.0 4.7  HGB 11.4* 11.2* 11.3* 11.1* 11.3*  HCT 32.3* 31.5* 32.0* 31.3* 31.9*  MCV 85.4 85.4 85.8 85.8 85.1  PLT 126* 149* 157 149* 153   Cardiac Enzymes: No results found for this basename: CKTOTAL:5,CKMB:5,CKMBINDEX:5,TROPONINI:5 in the last 168 hours BNP: No components found with this basename: POCBNP:5 CBG: No results found for  this basename: GLUCAP:5 in the last 168 hours D-Dimer No results found for this basename: DDIMER:2 in the last 72 hours Hgb A1c No results found for this basename: HGBA1C:2 in the last 72 hours Lipid Profile No results found for this basename: CHOL:2,HDL:2,LDLCALC:2,TRIG:2,CHOLHDL:2,LDLDIRECT:2 in the last 72 hours Thyroid function studies No results found for this basename: TSH,T4TOTAL,FREET3,T3FREE,THYROIDAB in the last 72 hours Anemia work up No results found for this basename: VITAMINB12:2,FOLATE:2,FERRITIN:2,TIBC:2,IRON:2,RETICCTPCT:2 in the last 72  hours Microbiology Recent Results (from the past 240 hour(s))  CLOSTRIDIUM DIFFICILE BY PCR     Status: Normal   Collection Time   11/17/12  8:27 PM      Component Value Range Status Comment   [name] difficile by pcr NEGATIVE  NEGATIVE Final       Studies:  No results found.  Assessment: 66 y.o. [place] man with a history of well-differentiated lymphocytic lymphoma/ chronic lymphoid leukemia initially diagnosed in 2000, not requiring intervention until 2006; with multiple chromosomal abnormalities; s/p allogeneic transplant March 2013 at "the [facility]", now admitted with GVHD.  His treatment history is as follows:   (1) fludarabine/cyclophosphamide/rituximab x5 completed May 2007.  (2) rituximab for 8 doses October 2010, with partial response  (3) Leustatin and ofatumumab weekly x8 July to September 2011 followed by maintenance ofatumumab maintenance ofatumumab every 2 months, with initial response but rising counts September 2012  (4) status-post unrelated donor stem-cell transplant 02/24/2012 at the [facility]  (a) conditioning regimen consisted of fludarabine + TBI at 200 cGy, followed by rituximab x27;  (b) CMV reactivation x3 (patient CMV positive, donor negative), s/p ganciclovir treatment; 3d reactivation August 2013, s/p gancyclovir, with negative PCR mid-September 2013; last gancyclovir dose 10/06/2012  ([name]) GVHD: involving gut and skin, treated with steroids, tacrolimus and MMF  (d) atrial fibrillation: resolved on amiodarone  (e) steroid-induced myopathy: improving until current crisis (f) hypomagnesemia: resolved with discontinuation of gancyclovir (g) hypogammaglobulinemia: s/p IVIG most recently 10/01/2012; IgG 617 on11/20/2013 (h) elevated triglycerides (606 on 07/14/2012)  (i) adrenal insufficiency: on prednisone  (j) pancytopenia; off growth factors (5) restaging studies September 2013 including CT scans, flow cytometry, and bone marrow biopsy, showed no evidence of residual  chronic lymphoid leukemia.  (6) now with a skin rash, mouth changes, severe diarrhea and gastric/duodenal/colonic biopsies 11/17/2012 [name]/w GVHD grade 2  Plan: he is improving though the GI system is still not functioning normally; tacrolimus level is therapeutic; have advanced diet and encouraged use of imodium/ lomotil,also OOBTC and to ambulate; currently all significant meds IV; I don't feel we can switch the immunosuppresants to po yet, perhaps can start tomorrow; full code  Timothy [name] 11/23/2012

## 2012-11-23 NOTE — Progress Notes (Signed)
Magnesium infusion on 09/20/12 should have end time stating infused over 2hrs,

## 2012-11-24 ENCOUNTER — Other Ambulatory Visit: Payer: Self-pay

## 2012-11-24 LAB — COMPREHENSIVE METABOLIC PANEL
ALT: 21 U/L (ref 0–53)
AST: 18 U/L (ref 0–37)
Albumin: 1.7 g/dL — ABNORMAL LOW (ref 3.5–5.2)
Alkaline Phosphatase: 38 U/L — ABNORMAL LOW (ref 39–117)
BUN: 20 mg/dL (ref 6–23)
Chloride: 110 mEq/L (ref 96–112)
Potassium: 3.8 mEq/L (ref 3.5–5.1)
Sodium: 134 mEq/L — ABNORMAL LOW (ref 135–145)
Total Bilirubin: 0.4 mg/dL (ref 0.3–1.2)

## 2012-11-24 LAB — CBC WITH DIFFERENTIAL/PLATELET
Basophils Absolute: 0 10*3/uL (ref 0.0–0.1)
HCT: 31.6 % — ABNORMAL LOW (ref 39.0–52.0)
Hemoglobin: 11.4 g/dL — ABNORMAL LOW (ref 13.0–17.0)
Lymphocytes Relative: 17 % (ref 12–46)
Monocytes Absolute: 0.4 10*3/uL (ref 0.1–1.0)
Neutro Abs: 3 10*3/uL (ref 1.7–7.7)
RDW: 15.5 % (ref 11.5–15.5)
WBC: 4.1 10*3/uL (ref 4.0–10.5)

## 2012-11-24 LAB — MAGNESIUM: Magnesium: 1.5 mg/dL (ref 1.5–2.5)

## 2012-11-24 NOTE — Progress Notes (Signed)
Edema noted in patient's BLE upon assessment. MD paged and fluids adjusted from NS @ 132mL/hr to 78mL/hr. Will continue to monitor patient. Angelena Form, RN

## 2012-11-25 LAB — COMPREHENSIVE METABOLIC PANEL
AST: 24 U/L (ref 0–37)
Albumin: 1.9 g/dL — ABNORMAL LOW (ref 3.5–5.2)
BUN: 21 mg/dL (ref 6–23)
Calcium: 7.6 mg/dL — ABNORMAL LOW (ref 8.4–10.5)
Creatinine, Ser: 0.79 mg/dL (ref 0.50–1.35)
GFR calc non Af Amer: >90 mL/min (ref >90)

## 2012-11-25 LAB — MAGNESIUM: Magnesium: 1.6 mg/dL (ref 1.5–2.5)

## 2012-11-25 LAB — CBC WITH DIFFERENTIAL/PLATELET
Basophils Absolute: 0 10*3/uL (ref 0.0–0.1)
Eosinophils Absolute: 0 10*3/uL (ref 0.0–0.7)
Eosinophils Relative: 0 % (ref 0–5)
MCH: 30.9 pg (ref 26.0–34.0)
MCHC: 36.5 g/dL — ABNORMAL HIGH (ref 30.0–36.0)
MCV: 84.7 fL (ref 78.0–100.0)
Monocytes Absolute: 0.4 10*3/uL (ref 0.1–1.0)
Platelets: 151 10*3/uL (ref 150–400)
RDW: 15.6 % — ABNORMAL HIGH (ref 11.5–15.5)

## 2012-11-25 MED ORDER — ENSURE PUDDING PO PUDG
1.0000 | Freq: Three times a day (TID) | ORAL | Status: DC
  Administered 2012-11-25: 1 via ORAL
  Filled 2012-11-25 (×3): qty 1

## 2012-11-25 MED ORDER — LEVOFLOXACIN 500 MG PO TABS
500.0000 mg | ORAL_TABLET | Freq: Every day | ORAL | Status: DC
  Administered 2012-11-25 – 2012-12-03 (×9): 500 mg via ORAL
  Filled 2012-11-25 (×9): qty 1

## 2012-11-25 MED ORDER — ENSURE COMPLETE PO LIQD
237.0000 mL | Freq: Every day | ORAL | Status: DC
  Administered 2012-11-25 – 2012-12-01 (×5): 237 mL via ORAL

## 2012-11-25 MED ORDER — FLUCONAZOLE 100 MG PO TABS
100.0000 mg | ORAL_TABLET | Freq: Every day | ORAL | Status: DC
  Administered 2012-11-25 – 2012-12-03 (×9): 100 mg via ORAL
  Filled 2012-11-25 (×9): qty 1

## 2012-11-25 MED ORDER — SULFAMETHOXAZOLE-TMP DS 800-160 MG PO TABS
1.0000 | ORAL_TABLET | Freq: Every day | ORAL | Status: DC
  Administered 2012-11-25 – 2012-12-03 (×9): 1 via ORAL
  Filled 2012-11-25 (×9): qty 1

## 2012-11-25 MED ORDER — ACYCLOVIR 200 MG PO CAPS
400.0000 mg | ORAL_CAPSULE | Freq: Two times a day (BID) | ORAL | Status: DC
  Administered 2012-11-25 – 2012-11-27 (×5): 400 mg via ORAL
  Filled 2012-11-25 (×7): qty 2

## 2012-11-25 MED ORDER — METHYLPREDNISOLONE SODIUM SUCC 40 MG IJ SOLR
40.0000 mg | Freq: Two times a day (BID) | INTRAMUSCULAR | Status: DC
  Administered 2012-11-25 (×2): 40 mg via INTRAVENOUS
  Filled 2012-11-25 (×5): qty 1

## 2012-11-25 NOTE — Progress Notes (Signed)
Timothy Timothy [name]   DOB:August 15, 1946   NW#:[id number]   MVH#:[id number]  Subjective: had 4 BMs yesterday (counting midnight to midnight); had one this AM about 2:30 AM, messy but scant; taking imodium/lomotil irregularly and we will try to rationalize that today; eating about 1/2 of the food he orders; thinks he is drinking about 1 quart/day; very weak, but ambulated x 3 yesterday; no mouth sores, no N/V, no itching, rest of ROS stable; no family in room  Objective: middle aged white man examined in bed  Filed Vitals:   11/25/12 0530  BP: 144/77  Pulse: 75  Temp: 98.1 F (36.7 Timothy [name])  Resp: 18    Body mass index is 30.14 kg/(m^2).    Sclerae unicteric; face not flushed today  Oropharynx shows tiny scattered white spots on tongue  No peripheral adenopathy  Lungs clear -- no rales or rhonchi, good excursion bilaterally  Heart regular rate and rhythm, no murmur appreciated  Abdomen soft, NT, +BS  MSK 1+bilateral ankle edema; joints flexible  Neuro nonfocal, A&O x3  Skin: the rash is almost completely resolved  Labs:  Results for Timothy Timothy [name], Timothy Timothy [name] (MRN [id number]) as of 11/23/2012 08:26  Ref. Range 08/04/2012 14:45 08/06/2012 15:10 08/11/2012 09:17 11/16/2012 18:00 11/21/2012 04:50  Tacrolimus Lvl Latest Range: 5.0-20.0 ng/mL 9.0 6.9 6.9 <2.0 (L) 13.3     Lab Results  Component Value Date   WBC 4.3 11/25/2012   HGB 11.9* 11/25/2012   HCT 32.6* 11/25/2012   MCV 84.7 11/25/2012   PLT 151 11/25/2012   NEUTROABS 3.2 11/25/2012    Urine Studies No results found for this basename: UACOL:2,UAPR:2,USPG:2,UPH:2,UTP:2,UGL:2,UKET:2,UBIL:2,UHGB:2,UNIT:2,UROB:2,ULEU:2,UEPI:2,UWBC:2,URBC:2,UBAC:2,CAST:2,CRYS:2,UCOM:2,BILUA:2 in the last 72 hours  Basic Metabolic Panel:  Lab 11/25/12 0272 11/24/12 0624 11/23/12 0530 11/22/12 0530 11/21/12 0450  NA 133* 134* 135 132* 131*  K 3.8 3.8 -- -- --  CL 108 110 110 108 107  CO2 17* 17* 17* 17* 16*  GLUCOSE 134* 130* 131* 133* 122*  BUN 21 20 18 17 16   CREATININE 0.79  0.82 0.82 0.85 0.86  CALCIUM 7.6* 7.6* 7.5* 7.6* 7.6*  MG 1.6 1.5 1.7 1.7 1.6  PHOS -- -- -- -- --   GFR Estimated Creatinine Clearance: 98.9 ml/min (by Timothy [name]-G formula based on Cr of 0.79). Liver Function Tests:  Lab 11/25/12 0450 11/24/12 0624 11/23/12 0530 11/22/12 0530 11/21/12 0450  AST 24 18 18 14 14   ALT 28 21 19 17 16   ALKPHOS 44 38* 37* 39 39  BILITOT 0.4 0.4 0.3 0.3 0.3  PROT 3.3* 3.0* 3.2* 3.3* 3.5*  ALBUMIN 1.9* 1.7* 1.8* 1.9* 2.0*   No results found for this basename: LIPASE:5,AMYLASE:5 in the last 168 hours No results found for this basename: AMMONIA:5 in the last 168 hours Coagulation profile No results found for this basename: INR:5,PROTIME:5 in the last 168 hours  CBC:  Lab 11/25/12 0450 11/24/12 0624 11/23/12 0530 11/22/12 0530 11/21/12 0450  WBC 4.3 4.1 5.6 4.2 5.9  NEUTROABS 3.2 3.0 4.4 3.0 4.9  HGB 11.9* 11.4* 11.4* 11.2* 11.3*  HCT 32.6* 31.6* 32.3* 31.5* 32.0*  MCV 84.7 84.7 85.4 85.4 85.8  PLT 151 146* 126* 149* 157   Cardiac Enzymes: No results found for this basename: CKTOTAL:5,CKMB:5,CKMBINDEX:5,TROPONINI:5 in the last 168 hours BNP: No components found with this basename: POCBNP:5 CBG: No results found for this basename: GLUCAP:5 in the last 168 hours D-Dimer No results found for this basename: DDIMER:2 in the last 72 hours Hgb A1c No results found for this basename: HGBA1C:2 in the last  72 hours Lipid Profile No results found for this basename: CHOL:2,HDL:2,LDLCALC:2,TRIG:2,CHOLHDL:2,LDLDIRECT:2 in the last 72 hours Thyroid function studies No results found for this basename: TSH,T4TOTAL,FREET3,T3FREE,THYROIDAB in the last 72 hours Anemia work up No results found for this basename: VITAMINB12:2,FOLATE:2,FERRITIN:2,TIBC:2,IRON:2,RETICCTPCT:2 in the last 72 hours Microbiology Recent Results (from the past 240 hour(s))  CLOSTRIDIUM DIFFICILE BY PCR     Status: Normal   Collection Time   11/17/12  8:27 PM      Component Value Range Status  Comment   Timothy [name] difficile by pcr NEGATIVE  NEGATIVE Final       Studies:  No results found.  Assessment: 66 y.o. Timothy Timothy [name] man with a history of well-differentiated lymphocytic lymphoma/ chronic lymphoid leukemia initially diagnosed in 2000, not requiring intervention until 2006; with multiple chromosomal abnormalities; s/p allogeneic transplant March 2013 at "the [facility]", now admitted with GVHD.  His treatment history is as follows:   (1) fludarabine/cyclophosphamide/rituximab x5 completed May 2007.  (2) rituximab for 8 doses October 2010, with partial response  (3) Leustatin and ofatumumab weekly x8 July to September 2011 followed by maintenance ofatumumab maintenance ofatumumab every 2 months, with initial response but rising counts September 2012  (4) status-post unrelated donor stem-cell transplant 02/24/2012 at the [facility]  (a) conditioning regimen consisted of fludarabine + TBI at 200 cGy, followed by rituximab x27;  (b) CMV reactivation x3 (patient CMV positive, donor negative), s/p ganciclovir treatment; 3d reactivation August 2013, s/p gancyclovir, with negative PCR mid-September 2013; last gancyclovir dose 10/06/2012  (Timothy [name]) GVHD: involving gut and skin, treated with steroids, tacrolimus and MMF  (d) atrial fibrillation: resolved on amiodarone  (e) steroid-induced myopathy: improving until current crisis (f) hypomagnesemia: resolved with discontinuation of gancyclovir (g) hypogammaglobulinemia: s/p IVIG most recently 10/01/2012; IgG 617 on11/20/2013 (h) elevated triglycerides (606 on 07/14/2012)  (i) adrenal insufficiency: on prednisone  (j) pancytopenia; off growth factors (5) restaging studies September 2013 including CT scans, flow cytometry, and bone marrow biopsy, showed no evidence of residual chronic lymphoid leukemia.  (6) now with a skin rash, mouth changes, severe diarrhea and gastric/duodenal/colonic biopsies 11/17/2012 Timothy [name]/w GVHD grade 2  Plan: moving VERY SLOWLY in the  right direction; I don't think his GI system is up to absorbing his immunosuppressants reliably yet, but I will switch his other maintenance meds to po; will supplement diet; have rationalized use of imodium/lomotil and will discuss w staff; will recheck tacrolimus level; am going to decrease IVF and also steroids. Encouraged OOBTC and to ambulate. Hopefully by next week we will be able to d/Timothy [name] to home, even if he has to go on a tacrolimus infusion. Full code  Timothy Timothy [name],Timothy Timothy [name] 11/25/2012

## 2012-11-25 NOTE — Progress Notes (Signed)
Nutrition Follow-up  Intervention: Ensure Complete daily - chocolate. Encouraged increased meal intake as diarrhea improves. Will monitor.   Diet Order:  Regular  - Pt with 4 bowel movements yesterday and 3 so far today. Pt reports they are still loose but are in smaller in volume. Pt reports he is consuming 50% of his meals. Pt interested in getting Ensure for additional nutrition - will order. Pt c/o some tightness in his legs from lower extremity swelling. Pt's weight up 26 pounds since admission.   Meds: Scheduled Meds:   . acyclovir  400 mg Oral BID  . antiseptic oral rinse  15 mL Mouth Rinse BID  . feeding supplement  1 Container Oral TID BM  . fluconazole  100 mg Oral Daily  . labetalol  200 mg Oral BID  . levofloxacin  500 mg Oral Daily  . lisinopril  10 mg Oral QHS  . methylPREDNISolone (SOLU-MEDROL) injection  40 mg Intravenous Q12H  . multivitamin with minerals  1 tablet Oral Daily  . prochlorperazine  5 mg Intravenous TID AC  . sertraline  100 mg Oral QODAY  . sertraline  50 mg Oral QODAY  . sulfamethoxazole-trimethoprim  1 tablet Oral Daily  . [DISCONTINUED] acyclovir  400 mg Intravenous Q12H  . [DISCONTINUED] enoxaparin (LOVENOX) injection  40 mg Subcutaneous Q24H  . [DISCONTINUED] fluconazole (DIFLUCAN) IV  100 mg Intravenous Q24H  . [DISCONTINUED] levofloxacin (LEVAQUIN) IV  500 mg Intravenous Q24H  . [DISCONTINUED] methylPREDNISolone (SOLU-MEDROL) injection  60 mg Intravenous Q12H  . [DISCONTINUED] sulfamethoxazole-trimethoprim  160 mg Intravenous Custom   Continuous Infusions:   . sodium chloride 15 mL/hr (11/25/12 0836)  . tacrolimus (PROGRAF) IV 0.0312 mg/hr (11/24/12 1705)   PRN Meds:.acetaminophen, acetaminophen, chlorhexidine, diphenoxylate-atropine, loperamide, ondansetron (ZOFRAN) IV, witch hazel-glycerin, zolpidem, [DISCONTINUED] LORazepam, [DISCONTINUED] meclizine, [DISCONTINUED] oxyCODONE, [DISCONTINUED] prochlorperazine   CMP     Component Value  Date/Time   NA 133* 11/25/2012 0450   NA 131* 11/10/2012 1410   K 3.8 11/25/2012 0450   K 4.6 11/10/2012 1410   CL 108 11/25/2012 0450   CL 104 11/10/2012 1410   CO2 17* 11/25/2012 0450   CO2 22 11/10/2012 1410   GLUCOSE 134* 11/25/2012 0450   GLUCOSE 93 11/10/2012 1410   BUN 21 11/25/2012 0450   BUN 20.0 11/10/2012 1410   CREATININE 0.79 11/25/2012 0450   CREATININE 1.1 11/10/2012 1410   CALCIUM 7.6* 11/25/2012 0450   CALCIUM 8.9 11/10/2012 1410   PROT 3.3* 11/25/2012 0450   PROT 5.3* 11/10/2012 1410   ALBUMIN 1.9* 11/25/2012 0450   ALBUMIN 3.0* 11/10/2012 1410   AST 24 11/25/2012 0450   AST 44* 11/10/2012 1410   ALT 28 11/25/2012 0450   ALT 39 11/10/2012 1410   ALKPHOS 44 11/25/2012 0450   ALKPHOS 67 11/10/2012 1410   BILITOT 0.4 11/25/2012 0450   BILITOT 0.35 11/10/2012 1410   GFRNONAA >90 11/25/2012 0450   GFRAA >90 11/25/2012 0450    CBG (last 3)  No results found for this basename: GLUCAP:3 in the last 72 hours   Intake/Output Summary (Last 24 hours) at 11/25/12 1100 Last data filed at 11/25/12 0605  Gross per 24 hour  Intake 2210.48 ml  Output    775 ml  Net 1435.48 ml   Last BM - 12/5  Weight Status:   11/29 183 lb 3.2 oz 12/5 198 lb 3.2 oz  Estimated needs:   1750-2100 calories 85-105g protein  Nutrition Dx:  Altered GI function - ongoing  Goal:  1. Resolution of diarrhea - not met 2. Advance diet as tolerated to regular diet - met  New goal: Pt to consume >75% of meals/supplements.   Monitor: Weights, labs, intake, diarrhea   Levon Hedger MS, RD, [place] [phone] Pager [phone] After Hours Pager

## 2012-11-26 ENCOUNTER — Ambulatory Visit: Payer: Self-pay | Admitting: Oncology

## 2012-11-26 DIAGNOSIS — C8599 Non-Hodgkin lymphoma, unspecified, extranodal and solid organ sites: Secondary | ICD-10-CM

## 2012-11-26 LAB — COMPREHENSIVE METABOLIC PANEL
ALT: 29 U/L (ref 0–53)
Alkaline Phosphatase: 40 U/L (ref 39–117)
BUN: 18 mg/dL (ref 6–23)
CO2: 19 mEq/L (ref 19–32)
Chloride: 107 mEq/L (ref 96–112)
GFR calc Af Amer: >90 mL/min (ref >90)
GFR calc non Af Amer: >90 mL/min (ref >90)
Glucose, Bld: 131 mg/dL — ABNORMAL HIGH (ref 70–99)
Potassium: 4.1 mEq/L (ref 3.5–5.1)
Sodium: 132 mEq/L — ABNORMAL LOW (ref 135–145)
Total Bilirubin: 0.3 mg/dL (ref 0.3–1.2)
Total Protein: 3 g/dL — ABNORMAL LOW (ref 6.0–8.3)

## 2012-11-26 LAB — CBC WITH DIFFERENTIAL/PLATELET
HCT: 29.9 % — ABNORMAL LOW (ref 39.0–52.0)
Hemoglobin: 10.8 g/dL — ABNORMAL LOW (ref 13.0–17.0)
Lymphocytes Relative: 26 % (ref 12–46)
Lymphs Abs: 0.7 10*3/uL (ref 0.7–4.0)
MCHC: 36.1 g/dL — ABNORMAL HIGH (ref 30.0–36.0)
Monocytes Absolute: 0.3 10*3/uL (ref 0.1–1.0)
Monocytes Relative: 9 % (ref 3–12)
Neutro Abs: 1.9 10*3/uL (ref 1.7–7.7)
Neutrophils Relative %: 65 % (ref 43–77)
RBC: 3.55 MIL/uL — ABNORMAL LOW (ref 4.22–5.81)

## 2012-11-26 MED ORDER — METHYLPREDNISOLONE SODIUM SUCC 40 MG IJ SOLR
20.0000 mg | Freq: Two times a day (BID) | INTRAMUSCULAR | Status: DC
  Administered 2012-11-26 – 2012-11-28 (×6): 20 mg via INTRAVENOUS
  Filled 2012-11-26: qty 0.5
  Filled 2012-11-26: qty 1
  Filled 2012-11-26 (×2): qty 0.5
  Filled 2012-11-26: qty 1
  Filled 2012-11-26 (×3): qty 0.5

## 2012-11-26 MED ORDER — BUDESONIDE 3 MG PO CP24
9.0000 mg | ORAL_CAPSULE | Freq: Every morning | ORAL | Status: DC
  Administered 2012-11-26 – 2012-12-03 (×8): 9 mg via ORAL
  Filled 2012-11-26 (×8): qty 3

## 2012-11-26 NOTE — Progress Notes (Signed)
Timothy [name]   DOB:[birth year]   ZO#:[id number]   JWJ#:[id number]  Subjective: "rough day" yesterday; had 6 BMs which he describes as small, but he is going in the toilet not the [facility]; urinating more, though still concentrated (by color); not drinking as much as he thought (so we upped his IVF back to 50 cc/hr from [facility]); ambulated only once; "messed" "only once--an improvement for me"; no h/a, nausea or vomiting, cough, SOB, pruritus or pain; no family in room   Objective: middle aged white man examined in bed  Filed Vitals:   11/26/12 0533  BP: 132/59  Pulse: 70  Temp: 97.8 F (36.6 C)  Resp: 20    Body mass index is 29.44 kg/(m^2).    Sclerae unicteric; face not flushed today  Oropharynx clear this AM  No peripheral adenopathy  Lungs clear -- no rales or rhonchi, good excursion bilaterally  Heart regular rate and rhythm, no murmur appreciated  Abdomen soft, NT, +BS  MSK minimal bilateral ankle edema;joints flexible; normal skin turgor  Neuro nonfocal, A&O x3, positive affect  Skin: rash resolved  Labs:  Results for Timothy [name], Timothy [name] (MRN [id number]) as of 11/23/2012 08:26  Ref. Range 08/04/2012 14:45 08/06/2012 15:10 08/11/2012 09:17 11/16/2012 18:00 11/21/2012 04:50  Tacrolimus Lvl Latest Range: 5.0-20.0 ng/mL 9.0 6.9 6.9 <2.0 (L) 13.3   Repeat tacrolimus level pending  Lab Results  Component Value Date   WBC 2.9* 11/26/2012   HGB 10.8* 11/26/2012   HCT 29.9* 11/26/2012   MCV 84.2 11/26/2012   PLT 113* 11/26/2012   NEUTROABS 1.9 11/26/2012    Urine Studies No results found for this basename: UACOL:2,UAPR:2,USPG:2,UPH:2,UTP:2,UGL:2,UKET:2,UBIL:2,UHGB:2,UNIT:2,UROB:2,ULEU:2,UEPI:2,UWBC:2,URBC:2,UBAC:2,CAST:2,CRYS:2,UCOM:2,BILUA:2 in the last 72 hours  Basic Metabolic Panel:  Lab 11/26/12 8469 11/25/12 0450 11/24/12 0624 11/23/12 0530 11/22/12 0530  NA 132* 133* 134* 135 132*  K 4.1 3.8 -- -- --  CL 107 108 110 110 108  CO2 19 17* 17* 17* 17*  GLUCOSE 131* 134* 130* 131* 133*  BUN  18 21 20 18 17   CREATININE 0.79 0.79 0.82 0.82 0.85  CALCIUM 7.6* 7.6* 7.6* 7.5* 7.6*  MG 1.6 1.6 1.5 1.7 1.7  PHOS -- -- -- -- --   GFR Estimated Creatinine Clearance: 97.9 ml/min (by C-G formula based on Cr of 0.79). Liver Function Tests:  Lab 11/26/12 0430 11/25/12 0450 11/24/12 0624 11/23/12 0530 11/22/12 0530  AST 21 24 18 18 14   ALT 29 28 21 19 17   ALKPHOS 40 44 38* 37* 39  BILITOT 0.3 0.4 0.4 0.3 0.3  PROT 3.0* 3.3* 3.0* 3.2* 3.3*  ALBUMIN 1.8* 1.9* 1.7* 1.8* 1.9*   No results found for this basename: LIPASE:5,AMYLASE:5 in the last 168 hours No results found for this basename: AMMONIA:5 in the last 168 hours Coagulation profile No results found for this basename: INR:5,PROTIME:5 in the last 168 hours  CBC:  Lab 11/26/12 0430 11/25/12 0450 11/24/12 0624 11/23/12 0530 11/22/12 0530  WBC 2.9* 4.3 4.1 5.6 4.2  NEUTROABS 1.9 3.2 3.0 4.4 3.0  HGB 10.8* 11.9* 11.4* 11.4* 11.2*  HCT 29.9* 32.6* 31.6* 32.3* 31.5*  MCV 84.2 84.7 84.7 85.4 85.4  PLT 113* 151 146* 126* 149*   Cardiac Enzymes: No results found for this basename: CKTOTAL:5,CKMB:5,CKMBINDEX:5,TROPONINI:5 in the last 168 hours BNP: No components found with this basename: POCBNP:5 CBG: No results found for this basename: GLUCAP:5 in the last 168 hours D-Dimer No results found for this basename: DDIMER:2 in the last 72 hours Hgb A1c No results found for this basename:  HGBA1C:2 in the last 72 hours Lipid Profile No results found for this basename: CHOL:2,HDL:2,LDLCALC:2,TRIG:2,CHOLHDL:2,LDLDIRECT:2 in the last 72 hours Thyroid function studies No results found for this basename: TSH,T4TOTAL,FREET3,T3FREE,THYROIDAB in the last 72 hours Anemia work up No results found for this basename: VITAMINB12:2,FOLATE:2,FERRITIN:2,TIBC:2,IRON:2,RETICCTPCT:2 in the last 72 hours Microbiology Recent Results (from the past 240 hour(s))  CLOSTRIDIUM DIFFICILE BY PCR     Status: Normal   Collection Time   11/17/12  8:27 PM       Component Value Range Status Comment   C difficile by pcr NEGATIVE  NEGATIVE Final       Studies:  No results found.  Assessment: 66 y.o. [place] man with a history of well-differentiated lymphocytic lymphoma/ chronic lymphoid leukemia initially diagnosed in 2000, not requiring intervention until 2006; with multiple chromosomal abnormalities; s/p allogeneic transplant March 2013 at "the [facility]", now admitted with GVHD.  His treatment history is as follows:   (1) fludarabine/cyclophosphamide/rituximab x5 completed May 2007.  (2) rituximab for 8 doses October 2010, with partial response  (3) Leustatin and ofatumumab weekly x8 July to September 2011 followed by maintenance ofatumumab maintenance ofatumumab every 2 months, with initial response but rising counts September 2012  (4) status-post unrelated donor stem-cell transplant 02/24/2012 at the [facility]  (a) conditioning regimen consisted of fludarabine + TBI at 200 cGy, followed by rituximab x27;  (b) CMV reactivation x3 (patient CMV positive, donor negative), s/p ganciclovir treatment; 3d reactivation August 2013, s/p gancyclovir, with negative PCR mid-September 2013; last gancyclovir dose 10/06/2012  (c) GVHD: involving gut and skin, treated with steroids, tacrolimus and MMF  (d) atrial fibrillation: resolved on amiodarone  (e) steroid-induced myopathy: improving until current crisis (f) hypomagnesemia: resolved with discontinuation of gancyclovir (g) hypogammaglobulinemia: s/p IVIG most recently 10/01/2012; IgG 617 on11/20/2013 (h) elevated triglycerides (606 on 07/14/2012)  (i) adrenal insufficiency: on prednisone  (j) pancytopenia; off growth factors (5) restaging studies September 2013 including CT scans, flow cytometry, and bone marrow biopsy, showed no evidence of residual chronic lymphoid leukemia.  (6) now with a skin rash, mouth changes, severe diarrhea and gastric/duodenal/colonic biopsies 11/17/2012 c/w GVHD grade  2  Plan: we switched most of his meds to oral yesterday (not the immunosuppressants) and so far he seems to be tolerating that though he did have a difficult day yesterday; I don't think C diff is the problem but will repeat that test. I am going to start him on endocort and drop his IV steroid dose to 20 mg BID--would continue on that dose over the weekend and reassess 12/09 Monday. I am encouraged that his HCO3 is slightly up--will watch for a trend. Discussed diet issues and encouraged him to use the [facility] so we can quantitate the stool volume. He needs to ambulate more. Possible discharge sometime next week. Full code  Timothy [name] C 11/26/2012

## 2012-11-27 DIAGNOSIS — R21 Rash and other nonspecific skin eruption: Secondary | ICD-10-CM

## 2012-11-27 LAB — COMPREHENSIVE METABOLIC PANEL
AST: 22 U/L (ref 0–37)
CO2: 19 mEq/L (ref 19–32)
Calcium: 7.5 mg/dL — ABNORMAL LOW (ref 8.4–10.5)
Creatinine, Ser: 0.8 mg/dL (ref 0.50–1.35)
GFR calc Af Amer: >90 mL/min (ref >90)
GFR calc non Af Amer: >90 mL/min (ref >90)
Glucose, Bld: 118 mg/dL — ABNORMAL HIGH (ref 70–99)
Total Protein: 3 g/dL — ABNORMAL LOW (ref 6.0–8.3)

## 2012-11-27 LAB — CBC WITH DIFFERENTIAL/PLATELET
Basophils Relative: 0 % (ref 0–1)
Eosinophils Absolute: 0 10*3/uL (ref 0.0–0.7)
Eosinophils Relative: 0 % (ref 0–5)
Lymphs Abs: 0.9 10*3/uL (ref 0.7–4.0)
MCH: 30.5 pg (ref 26.0–34.0)
MCHC: 36 g/dL (ref 30.0–36.0)
MCV: 84.7 fL (ref 78.0–100.0)
Platelets: 112 10*3/uL — ABNORMAL LOW (ref 150–400)
RBC: 3.54 MIL/uL — ABNORMAL LOW (ref 4.22–5.81)

## 2012-11-27 LAB — MAGNESIUM: Magnesium: 1.5 mg/dL (ref 1.5–2.5)

## 2012-11-27 MED ORDER — TACROLIMUS 5 MG/ML IV SOLN
0.0090 mg/kg/d | INTRAVENOUS | Status: DC
  Administered 2012-11-27 – 2012-11-30 (×4): via INTRAVENOUS
  Filled 2012-11-27 (×7): qty 1

## 2012-11-27 MED ORDER — PROCHLORPERAZINE EDISYLATE 5 MG/ML IJ SOLN
5.0000 mg | INTRAMUSCULAR | Status: DC | PRN
  Administered 2012-11-27 – 2012-11-28 (×2): 5 mg via INTRAVENOUS
  Administered 2012-11-28: 13:00:00 via INTRAVENOUS
  Administered 2012-11-30 – 2012-12-03 (×8): 5 mg via INTRAVENOUS
  Filled 2012-11-27 (×9): qty 2

## 2012-11-27 MED ORDER — ACYCLOVIR 400 MG PO TABS
400.0000 mg | ORAL_TABLET | Freq: Two times a day (BID) | ORAL | Status: DC
  Administered 2012-11-27 – 2012-12-03 (×12): 400 mg via ORAL
  Filled 2012-11-27 (×13): qty 1

## 2012-11-27 MED ORDER — PANTOPRAZOLE SODIUM 40 MG PO TBEC
40.0000 mg | DELAYED_RELEASE_TABLET | Freq: Every day | ORAL | Status: DC
  Administered 2012-11-27 – 2012-12-03 (×7): 40 mg via ORAL
  Filled 2012-11-27 (×7): qty 1

## 2012-11-27 NOTE — Progress Notes (Signed)
Worsening edema noted to pt's BLE at shift change. Elevated pt's legs on 2 pillows. Passed this on to dayshift RN to notify MD to possibly reduce rate of IVF.

## 2012-11-27 NOTE — Progress Notes (Signed)
Timothy [name]   DOB:03-06-1946   ZO#:[id number]   JWJ#:[id number]  Subjective: He reports improvement in the diarrhea.  Objective:  Filed Vitals:   11/27/12 0611  BP: 144/84  Pulse: 79  Temp: 98 F (36.7 C)  Resp: 20    Body mass index is 29.39 kg/(m^2).      Oropharynx -no thrush or ulcers             Lungs clear bilaterally  Heart regular rate and rhythm, no murmur appreciated  Abdomen soft, NT, no hepatomegaly  Vascular: Pitting edema at the low legs and feet bilaterally   Neuro nonfocal, A&O x3, positive affect             Right upper chest Hickman catheter site without evidence of infection   Labs:  Results for Timothy [name] (MRN [id number]) as of 11/23/2012 08:26  Ref. Range 08/04/2012 14:45 08/06/2012 15:10 08/11/2012 09:17 11/16/2012 18:00 11/21/2012 04:50  Tacrolimus Lvl Latest Range: 5.0-20.0 ng/mL 9.0 6.9 6.9 <2.0 (L) 13.3    Lab Results  Component Value Date   WBC 3.2* 11/27/2012   HGB 10.8* 11/27/2012   HCT 30.0* 11/27/2012   MCV 84.7 11/27/2012   PLT 112* 11/27/2012   NEUTROABS 1.9 11/27/2012    Urine Studies No results found for this basename: UACOL:2,UAPR:2,USPG:2,UPH:2,UTP:2,UGL:2,UKET:2,UBIL:2,UHGB:2,UNIT:2,UROB:2,ULEU:2,UEPI:2,UWBC:2,URBC:2,UBAC:2,CAST:2,CRYS:2,UCOM:2,BILUA:2 in the last 72 hours  Basic Metabolic Panel:  Lab 11/27/12 8469 11/26/12 0430 11/25/12 0450 11/24/12 0624 11/23/12 0530  NA 132* 132* 133* 134* 135  K 3.8 4.1 -- -- --  CL 106 107 108 110 110  CO2 19 19 17* 17* 17*  GLUCOSE 118* 131* 134* 130* 131*  BUN 16 18 21 20 18   CREATININE 0.80 0.79 0.79 0.82 0.82  CALCIUM 7.5* 7.6* 7.6* 7.6* 7.5*  MG 1.5 1.6 1.6 1.5 1.7  PHOS -- -- -- -- --   GFR Estimated Creatinine Clearance: 97.8 ml/min (by C-G formula based on Cr of 0.8). Liver Function Tests:  Lab 11/27/12 0500 11/26/12 0430 11/25/12 0450 11/24/12 0624 11/23/12 0530  AST 22 21 24 18 18   ALT 31 29 28 21 19   ALKPHOS 43 40 44 38* 37*  BILITOT 0.4 0.3 0.4 0.4 0.3  PROT 3.0* 3.0*  3.3* 3.0* 3.2*  ALBUMIN 1.7* 1.8* 1.9* 1.7* 1.8*   Microbiology Recent Results (from the past 240 hour(s))  CLOSTRIDIUM DIFFICILE BY PCR     Status: Normal   Collection Time   11/17/12  8:27 PM      Component Value Range Status Comment   C difficile by pcr NEGATIVE  NEGATIVE Final   CLOSTRIDIUM DIFFICILE BY PCR     Status: Normal   Collection Time   11/26/12 11:25 AM      Component Value Range Status Comment   C difficile by pcr NEGATIVE  NEGATIVE Final       Studies:  No results found.  Assessment: 66 y.o. [place] man with a history of well-differentiated lymphocytic lymphoma/ chronic lymphoid leukemia initially diagnosed in 2000, not requiring intervention until 2006; with multiple chromosomal abnormalities; s/p allogeneic transplant March 2013 at "[facility]", now admitted with GVHD.  His treatment history is as follows:   (1) fludarabine/cyclophosphamide/rituximab x5 completed May 2007.  (2) rituximab for 8 doses October 2010, with partial response  (3) Leustatin and ofatumumab weekly x8 July to September 2011 followed by maintenance ofatumumab maintenance ofatumumab every 2 months, with initial response but rising counts September 2012  (4) status-post unrelated donor stem-cell transplant 02/24/2012 at the [facility]  (a)  conditioning regimen consisted of fludarabine + TBI at 200 cGy, followed by rituximab x27;  (b) CMV reactivation x3 (patient CMV positive, donor negative), s/p ganciclovir treatment; 3d reactivation August 2013, s/p gancyclovir, with negative PCR mid-September 2013; last gancyclovir dose 10/06/2012  (c) GVHD: involving gut and skin, treated with steroids, tacrolimus and MMF  (d) atrial fibrillation: resolved on amiodarone  (e) steroid-induced myopathy: improving until current crisis (f) hypomagnesemia: resolved with discontinuation of gancyclovir (g) hypogammaglobulinemia: s/p IVIG most recently 10/01/2012; IgG 617 on11/20/2013 (h) elevated triglycerides (606  on 07/14/2012)  (i) adrenal insufficiency: on prednisone  (j) pancytopenia; off growth factors (5) restaging studies September 2013 including CT scans, flow cytometry, and bone marrow biopsy, showed no evidence of residual chronic lymphoid leukemia.  (6) now with a skin rash, mouth changes, severe diarrhea and gastric/duodenal/colonic biopsies 11/17/2012 c/w GVHD grade 2  He appears stable. The repeat C. difficile testing was negative. Plan to continue treatment of the diarrhea/GVH as planned by Dr. Darnelle Catalan.  Timothy [name], Timothy [name] 11/27/2012

## 2012-11-28 DIAGNOSIS — E8809 Other disorders of plasma-protein metabolism, not elsewhere classified: Secondary | ICD-10-CM

## 2012-11-28 DIAGNOSIS — R609 Edema, unspecified: Secondary | ICD-10-CM

## 2012-11-28 LAB — COMPREHENSIVE METABOLIC PANEL
ALT: 36 U/L (ref 0–53)
Alkaline Phosphatase: 44 U/L (ref 39–117)
CO2: 20 mEq/L (ref 19–32)
Chloride: 105 mEq/L (ref 96–112)
GFR calc Af Amer: >90 mL/min (ref >90)
GFR calc non Af Amer: >90 mL/min (ref >90)
Glucose, Bld: 103 mg/dL — ABNORMAL HIGH (ref 70–99)
Potassium: 3.9 mEq/L (ref 3.5–5.1)
Sodium: 133 mEq/L — ABNORMAL LOW (ref 135–145)
Total Bilirubin: 0.4 mg/dL (ref 0.3–1.2)
Total Protein: 3.1 g/dL — ABNORMAL LOW (ref 6.0–8.3)

## 2012-11-28 LAB — CBC WITH DIFFERENTIAL/PLATELET
Hemoglobin: 10.6 g/dL — ABNORMAL LOW (ref 13.0–17.0)
Lymphocytes Relative: 31 % (ref 12–46)
Lymphs Abs: 0.9 10*3/uL (ref 0.7–4.0)
Monocytes Relative: 12 % (ref 3–12)
Neutro Abs: 1.7 10*3/uL (ref 1.7–7.7)
Neutrophils Relative %: 57 % (ref 43–77)
RBC: 3.46 MIL/uL — ABNORMAL LOW (ref 4.22–5.81)

## 2012-11-28 LAB — TACROLIMUS LEVEL: Tacrolimus (FK506) - LabCorp: 5.6 ng/mL

## 2012-11-28 NOTE — Progress Notes (Signed)
Timothy [name]   DOB:[birth year]   ZO#:[id number]   JWJ#:[id number]  Subjective: No diarrhea during the night. No nausea. No complaint this morning.  Objective:  Filed Vitals:   11/28/12 0525  BP: 136/73  Pulse: 75  Temp: 98.2 F (36.8 C)  Resp: 16    Body mass index is 29.44 kg/(m^2).      Oropharynx -no thrush or ulcers             Lungs clear bilaterally  Heart regular rate and rhythm, no murmur appreciated  Abdomen soft, NT, no hepatomegaly  Vascular: Pitting edema at the low legs and feet bilaterally              Right upper chest Hickman catheter site without evidence of infection   Labs:  Results for ATWOOD, ADCOCK (MRN [id number]) as of 11/23/2012 08:26  Ref. Range 08/04/2012 14:45 08/06/2012 15:10 08/11/2012 09:17 11/16/2012 18:00 11/21/2012 04:50  Tacrolimus Lvl Latest Range: 5.0-20.0 ng/mL 9.0 6.9 6.9 <2.0 (L) 13.3    Lab Results  Component Value Date   WBC 3.0* 11/28/2012   HGB 10.6* 11/28/2012   HCT 29.1* 11/28/2012   MCV 84.1 11/28/2012   PLT 105* 11/28/2012   NEUTROABS 1.7 11/28/2012    Urine Studies No results found for this basename: UACOL:2,UAPR:2,USPG:2,UPH:2,UTP:2,UGL:2,UKET:2,UBIL:2,UHGB:2,UNIT:2,UROB:2,ULEU:2,UEPI:2,UWBC:2,URBC:2,UBAC:2,CAST:2,CRYS:2,UCOM:2,BILUA:2 in the last 72 hours  Basic Metabolic Panel:  Lab 11/28/12 8469 11/27/12 0500 11/26/12 0430 11/25/12 0450 11/24/12 0624  NA 133* 132* 132* 133* 134*  K 3.9 3.8 -- -- --  CL 105 106 107 108 110  CO2 20 19 19  17* 17*  GLUCOSE 103* 118* 131* 134* 130*  BUN 17 16 18 21 20   CREATININE 0.80 0.80 0.79 0.79 0.82  CALCIUM 7.7* 7.5* 7.6* 7.6* 7.6*  MG 1.6 1.5 1.6 1.6 1.5  PHOS -- -- -- -- --   GFR Estimated Creatinine Clearance: 97.9 ml/min (by C-G formula based on Cr of 0.8). Liver Function Tests:  Lab 11/28/12 0610 11/27/12 0500 11/26/12 0430 11/25/12 0450 11/24/12 0624  AST 25 22 21 24 18   ALT 36 31 29 28 21   ALKPHOS 44 43 40 44 38*  BILITOT 0.4 0.4 0.3 0.4 0.4  PROT 3.1* 3.0* 3.0* 3.3* 3.0*   ALBUMIN 1.8* 1.7* 1.8* 1.9* 1.7*   Microbiology Recent Results (from the past 240 hour(s))  CLOSTRIDIUM DIFFICILE BY PCR     Status: Normal   Collection Time   11/26/12 11:25 AM      Component Value Range Status Comment   C difficile by pcr NEGATIVE  NEGATIVE Final       Studies:  No results found.  Assessment: 66 y.o. [place] man with a history of well-differentiated lymphocytic lymphoma/ chronic lymphoid leukemia initially diagnosed in 2000, not requiring intervention until 2006; with multiple chromosomal abnormalities; s/p allogeneic transplant March 2013 at "the [facility]", now admitted with GVHD.  His treatment history is as follows:   (1) fludarabine/cyclophosphamide/rituximab x5 completed May 2007.  (2) rituximab for 8 doses October 2010, with partial response  (3) Leustatin and ofatumumab weekly x8 July to September 2011 followed by maintenance ofatumumab maintenance ofatumumab every 2 months, with initial response but rising counts September 2012  (4) status-post unrelated donor stem-cell transplant 02/24/2012 at the [facility]  (a) conditioning regimen consisted of fludarabine + TBI at 200 cGy, followed by rituximab x27;  (b) CMV reactivation x3 (patient CMV positive, donor negative), s/p ganciclovir treatment; 3d reactivation August 2013, s/p gancyclovir, with negative PCR mid-September 2013; last gancyclovir dose 10/06/2012  (c)  GVHD: involving gut and skin, treated with steroids, tacrolimus and MMF  (d) atrial fibrillation: resolved on amiodarone  (e) steroid-induced myopathy: improving until current crisis (f) hypomagnesemia: resolved with discontinuation of gancyclovir (g) hypogammaglobulinemia: s/p IVIG most recently 10/01/2012; IgG 617 on11/20/2013 (h) elevated triglycerides (606 on 07/14/2012)  (i) adrenal insufficiency: on prednisone  (j) pancytopenia; off growth factors (5) restaging studies September 2013 including CT scans, flow cytometry, and bone marrow biopsy,  showed no evidence of residual chronic lymphoid leukemia.  (6) now with a skin rash, mouth changes, severe diarrhea and gastric/duodenal/colonic biopsies 11/17/2012 c/w GVHD grade 2 (7) anasarca secondary to intravenous hydration and hypoalbuminemia-the IV fluids were decreased to a KVO rate on 11/27/2012  The diarrhea has improved. No nausea. The GVH appears to be improving. Continue steroids, tacrolimus, and antibiotics as prescribed by Dr. Darnelle Catalan.   Timothy [name], Timothy [name] 11/28/2012

## 2012-11-29 ENCOUNTER — Other Ambulatory Visit: Payer: Self-pay | Admitting: Oncology

## 2012-11-29 LAB — CBC WITH DIFFERENTIAL/PLATELET
Basophils Absolute: 0 10*3/uL (ref 0.0–0.1)
Basophils Relative: 0 % (ref 0–1)
HCT: 28.8 % — ABNORMAL LOW (ref 39.0–52.0)
MCHC: 36.1 g/dL — ABNORMAL HIGH (ref 30.0–36.0)
Monocytes Absolute: 0.5 10*3/uL (ref 0.1–1.0)
Neutro Abs: 2 10*3/uL (ref 1.7–7.7)
RDW: 15.6 % — ABNORMAL HIGH (ref 11.5–15.5)

## 2012-11-29 LAB — COMPREHENSIVE METABOLIC PANEL
Albumin: 1.7 g/dL — ABNORMAL LOW (ref 3.5–5.2)
Alkaline Phosphatase: 46 U/L (ref 39–117)
BUN: 17 mg/dL (ref 6–23)
Creatinine, Ser: 0.83 mg/dL (ref 0.50–1.35)
Potassium: 4 mEq/L (ref 3.5–5.1)
Total Protein: 3.1 g/dL — ABNORMAL LOW (ref 6.0–8.3)

## 2012-11-29 LAB — TACROLIMUS, BLOOD: Tacrolimus Lvl: 7.3 ng/mL (ref 5.0–20.0)

## 2012-11-29 NOTE — Progress Notes (Signed)
Timothy [name]   DOB:11/06/1946   ZO#:[id number]   JWJ#:[id number]  Subjective: slow improvement over weekend; only had 3 BMs yesterday, one after each main meal; they are now like "baby food;" no "accidents;" drinking more, about 16 oz overnight (he is keeping a litre bottle by bedside); only ambulated twice yesterday; no h/a, nausea or vomiting, cough, SOB, pruritus or pain; he was able to turn in all his grades for this semester, which is an achievement; no family in room   Objective: middle aged white man examined in bed  Filed Vitals:   11/29/12 0618  BP: 131/74  Pulse: 73  Temp: 98.1 F (36.7 C)  Resp: 20    Body mass index is 29.44 kg/(m^2).    Sclerae unicteric; face not flushed .  Oropharynx clear, not dry  No peripheral adenopathy  Lungs no rales or rhonchi, good excursion bilaterally  Heart regular rate and rhythm, no murmur appreciated  Abdomen soft, NT, +BS  MSK 2+ bilateral ankle edema; joints flexible; normal skin turgor  Neuro nonfocal, A&O x3, positive affect  Skin: rash resolved  Labs:  Tacrolimus level drawn 12/07 <6, but issues with infusion (discussed with pharmacy); will repeat level today  Lab Results  Component Value Date   WBC 3.5* 11/29/2012   HGB 10.4* 11/29/2012   HCT 28.8* 11/29/2012   MCV 84.2 11/29/2012   PLT 96* 11/29/2012   NEUTROABS 2.0 11/29/2012    Urine Studies No results found for this basename: UACOL:2,UAPR:2,USPG:2,UPH:2,UTP:2,UGL:2,UKET:2,UBIL:2,UHGB:2,UNIT:2,UROB:2,ULEU:2,UEPI:2,UWBC:2,URBC:2,UBAC:2,CAST:2,CRYS:2,UCOM:2,BILUA:2 in the last 72 hours  Basic Metabolic Panel:  Lab 11/29/12 6213 11/28/12 0610 11/27/12 0500 11/26/12 0430 11/25/12 0450  NA 131* 133* 132* 132* 133*  K 4.0 3.9 -- -- --  CL 104 105 106 107 108  CO2 22 20 19 19  17*  GLUCOSE 108* 103* 118* 131* 134*  BUN 17 17 16 18 21   CREATININE 0.83 0.80 0.80 0.79 0.79  CALCIUM 7.6* 7.7* 7.5* 7.6* 7.6*  MG 1.5 1.6 1.5 1.6 1.6  PHOS -- -- -- -- --   GFR Estimated  Creatinine Clearance: 94.4 ml/min (by C-G formula based on Cr of 0.83). Liver Function Tests:  Lab 11/29/12 0630 11/28/12 0610 11/27/12 0500 11/26/12 0430 11/25/12 0450  AST 21 25 22 21 24   ALT 34 36 31 29 28   ALKPHOS 46 44 43 40 44  BILITOT 0.3 0.4 0.4 0.3 0.4  PROT 3.1* 3.1* 3.0* 3.0* 3.3*  ALBUMIN 1.7* 1.8* 1.7* 1.8* 1.9*   No results found for this basename: LIPASE:5,AMYLASE:5 in the last 168 hours No results found for this basename: AMMONIA:5 in the last 168 hours Coagulation profile No results found for this basename: INR:5,PROTIME:5 in the last 168 hours  CBC:  Lab 11/29/12 0630 11/28/12 0610 11/27/12 0500 11/26/12 0430 11/25/12 0450  WBC 3.5* 3.0* 3.2* 2.9* 4.3  NEUTROABS 2.0 1.7 1.9 1.9 3.2  HGB 10.4* 10.6* 10.8* 10.8* 11.9*  HCT 28.8* 29.1* 30.0* 29.9* 32.6*  MCV 84.2 84.1 84.7 84.2 84.7  PLT 96* 105* 112* 113* 151   Cardiac Enzymes: No results found for this basename: CKTOTAL:5,CKMB:5,CKMBINDEX:5,TROPONINI:5 in the last 168 hours BNP: No components found with this basename: POCBNP:5 CBG: No results found for this basename: GLUCAP:5 in the last 168 hours D-Dimer No results found for this basename: DDIMER:2 in the last 72 hours Hgb A1c No results found for this basename: HGBA1C:2 in the last 72 hours Lipid Profile No results found for this basename: CHOL:2,HDL:2,LDLCALC:2,TRIG:2,CHOLHDL:2,LDLDIRECT:2 in the last 72 hours Thyroid function studies No results found  for this basename: TSH,T4TOTAL,FREET3,T3FREE,THYROIDAB in the last 72 hours Anemia work up No results found for this basename: VITAMINB12:2,FOLATE:2,FERRITIN:2,TIBC:2,IRON:2,RETICCTPCT:2 in the last 72 hours Microbiology Recent Results (from the past 240 hour(s))  CLOSTRIDIUM DIFFICILE BY PCR     Status: Normal   Collection Time   11/26/12 11:25 AM      Component Value Range Status Comment   C difficile by pcr NEGATIVE  NEGATIVE Final    Rotovirus negative   Studies:  No results  found.  Assessment: 66 y.o. [place] man with a history of well-differentiated lymphocytic lymphoma/ chronic lymphoid leukemia initially diagnosed in 2000, not requiring intervention until 2006; with multiple chromosomal abnormalities; s/p allogeneic transplant March 2013 at "the [facility]", now admitted with GVHD.  His treatment history is as follows:   (1) fludarabine/cyclophosphamide/rituximab x5 completed May 2007.  (2) rituximab for 8 doses October 2010, with partial response  (3) Leustatin and ofatumumab weekly x8 July to September 2011 followed by maintenance ofatumumab maintenance ofatumumab every 2 months, with initial response but rising counts September 2012  (4) status-post unrelated donor stem-cell transplant 02/24/2012 at the [facility]  (a) conditioning regimen consisted of fludarabine + TBI at 200 cGy, followed by rituximab x27;  (b) CMV reactivation x3 (patient CMV positive, donor negative), s/p ganciclovir treatment; 3d reactivation August 2013, s/p gancyclovir, with negative PCR mid-September 2013; last gancyclovir dose 10/06/2012  (c) GVHD: involving gut and skin, treated with steroids, tacrolimus and MMF  (d) atrial fibrillation: resolved on amiodarone  (e) steroid-induced myopathy: improving until current crisis (f) hypomagnesemia: resolved with discontinuation of gancyclovir (g) hypogammaglobulinemia: s/p IVIG most recently 10/01/2012; IgG 617 on11/20/2013 (h) elevated triglycerides (606 on 07/14/2012)  (i) adrenal insufficiency: on prednisone  (j) pancytopenia; off growth factors (5) restaging studies September 2013 including CT scans, flow cytometry, and bone marrow biopsy, showed no evidence of residual chronic lymphoid leukemia.  (6) now with a skin rash, mouth changes, severe diarrhea and gastric/duodenal/colonic biopsies 11/17/2012 c/w GVHD grade 2  Plan: [place] continues to slowly improve and I think we will be ale to get him home this week (he was hoping today); will  d/c IV steroids, continue budesonide po; will recheck tacrolimus level today, likely switch to po tomorrow; I have encouraged him to ambulate several times a day; will add TED hose and decrease IVF support. Full code  MAGRINAT,GUSTAV C 11/29/2012

## 2012-11-30 LAB — CBC WITH DIFFERENTIAL/PLATELET
HCT: 31.5 % — ABNORMAL LOW (ref 39.0–52.0)
Hemoglobin: 11.2 g/dL — ABNORMAL LOW (ref 13.0–17.0)
Lymphocytes Relative: 38 % (ref 12–46)
Monocytes Absolute: 0.4 10*3/uL (ref 0.1–1.0)
Monocytes Relative: 15 % — ABNORMAL HIGH (ref 3–12)
Neutro Abs: 1.4 10*3/uL — ABNORMAL LOW (ref 1.7–7.7)
RBC: 3.69 MIL/uL — ABNORMAL LOW (ref 4.22–5.81)
WBC: 3 10*3/uL — ABNORMAL LOW (ref 4.0–10.5)

## 2012-11-30 LAB — COMPREHENSIVE METABOLIC PANEL
ALT: 35 U/L (ref 0–53)
Albumin: 1.7 g/dL — ABNORMAL LOW (ref 3.5–5.2)
Alkaline Phosphatase: 48 U/L (ref 39–117)
BUN: 16 mg/dL (ref 6–23)
Chloride: 103 mEq/L (ref 96–112)
GFR calc Af Amer: >90 mL/min (ref >90)
Glucose, Bld: 87 mg/dL (ref 70–99)
Potassium: 3.7 mEq/L (ref 3.5–5.1)
Total Bilirubin: 0.4 mg/dL (ref 0.3–1.2)

## 2012-11-30 NOTE — Progress Notes (Signed)
OT Note:  Provided red theraputty for hand strengthening:  Pt did not need a written HEP.  [place], OTR/L [phone] 11/30/2012

## 2012-11-30 NOTE — Progress Notes (Signed)
Timothy [name]   DOB:10-10-1946   WU#:[id number]   GNF#:[id number]  Subjective: Timothy [name] tells me he had only 2 bowel movements yesterday. One was "messy". Both were "like baby food". He had one this morning between 2 and 3 AM. He only walked twice yesterday. Otherwise he has not noted any further changes in his mouth, joints, or skin. A detailed review of systems was otherwise stable. No family in room.  Objective: 66 year old white [name] examined in bed  Filed Vitals:   11/30/12 0500  BP: 123/67  Pulse: 79  Temp: 98.8 F (37.1 C)  Resp: 18    Body mass index is 29.44 kg/(m^2).    Sclerae unicteric; face looks splotchy today; eyelids minimally irritated  Oropharynx shows minimal white flecks  No peripheral adenopathy  Lungs no rales or rhonchi, good excursion bilaterally  Heart regular rate and rhythm, no murmur appreciated  Abdomen soft, NT, +BS  MSK 2+ bilateral ankle edema; TED hose in place; joints flexible; normal skin turgor  Neuro nonfocal, A&O x3, positive affect  Skin: rash resolved  Labs:  Tacrolimus level drawn 12/09 in the lower acceptable level  Lab Results  Component Value Date   WBC 3.0* 11/30/2012   HGB 11.2* 11/30/2012   HCT 31.5* 11/30/2012   MCV 85.4 11/30/2012   PLT 90* 11/30/2012   NEUTROABS 1.4* 11/30/2012    Urine Studies No results found for this basename: UACOL:2,UAPR:2,USPG:2,UPH:2,UTP:2,UGL:2,UKET:2,UBIL:2,UHGB:2,UNIT:2,UROB:2,ULEU:2,UEPI:2,UWBC:2,URBC:2,UBAC:2,CAST:2,CRYS:2,UCOM:2,BILUA:2 in the last 72 hours  Basic Metabolic Panel:  Lab 11/30/12 8469 11/29/12 0630 11/28/12 0610 11/27/12 0500 11/26/12 0430  NA 131* 131* 133* 132* 132*  K 3.7 4.0 -- -- --  CL 103 104 105 106 107  CO2 22 22 20 19 19   GLUCOSE 87 108* 103* 118* 131*  BUN 16 17 17 16 18   CREATININE 0.84 0.83 0.80 0.80 0.79  CALCIUM 7.6* 7.6* 7.7* 7.5* 7.6*  MG 1.4* 1.5 1.6 1.5 1.6  PHOS -- -- -- -- --   GFR Estimated Creatinine Clearance: 93.2 ml/min (by C-G formula based on Cr  of 0.84). Liver Function Tests:  Lab 11/30/12 0520 11/29/12 0630 11/28/12 0610 11/27/12 0500 11/26/12 0430  AST 25 21 25 22 21   ALT 35 34 36 31 29  ALKPHOS 48 46 44 43 40  BILITOT 0.4 0.3 0.4 0.4 0.3  PROT 3.0* 3.1* 3.1* 3.0* 3.0*  ALBUMIN 1.7* 1.7* 1.8* 1.7* 1.8*   No results found for this basename: LIPASE:5,AMYLASE:5 in the last 168 hours No results found for this basename: AMMONIA:5 in the last 168 hours Coagulation profile No results found for this basename: INR:5,PROTIME:5 in the last 168 hours  CBC:  Lab 11/30/12 0520 11/29/12 0630 11/28/12 0610 11/27/12 0500 11/26/12 0430  WBC 3.0* 3.5* 3.0* 3.2* 2.9*  NEUTROABS 1.4* 2.0 1.7 1.9 1.9  HGB 11.2* 10.4* 10.6* 10.8* 10.8*  HCT 31.5* 28.8* 29.1* 30.0* 29.9*  MCV 85.4 84.2 84.1 84.7 84.2  PLT 90* 96* 105* 112* 113*   Cardiac Enzymes: No results found for this basename: CKTOTAL:5,CKMB:5,CKMBINDEX:5,TROPONINI:5 in the last 168 hours BNP: No components found with this basename: POCBNP:5 CBG: No results found for this basename: GLUCAP:5 in the last 168 hours D-Dimer No results found for this basename: DDIMER:2 in the last 72 hours Hgb A1c No results found for this basename: HGBA1C:2 in the last 72 hours Lipid Profile No results found for this basename: CHOL:2,HDL:2,LDLCALC:2,TRIG:2,CHOLHDL:2,LDLDIRECT:2 in the last 72 hours Thyroid function studies No results found for this basename: TSH,T4TOTAL,FREET3,T3FREE,THYROIDAB in the last 72 hours Anemia work  up No results found for this basename: VITAMINB12:2,FOLATE:2,FERRITIN:2,TIBC:2,IRON:2,RETICCTPCT:2 in the last 72 hours Microbiology Recent Results (from the past 240 hour(s))  CLOSTRIDIUM DIFFICILE BY PCR     Status: Normal   Collection Time   11/26/12 11:25 AM      Component Value Range Status Comment   C difficile by pcr NEGATIVE  NEGATIVE Final    Rotovirus negative   Studies:  No results found.  Assessment: 66 y.o. Timothy [name] with a history of  well-differentiated lymphocytic lymphoma/ chronic lymphoid leukemia initially diagnosed in 2000, not requiring intervention until 2006; with multiple chromosomal abnormalities; s/p allogeneic transplant March 2013 at "the [facility]", now admitted with GVHD.  His treatment history is as follows:   (1) fludarabine/cyclophosphamide/rituximab x5 completed May 2007.  (2) rituximab for 8 doses October 2010, with partial response  (3) Leustatin and ofatumumab weekly x8 July to September 2011 followed by maintenance ofatumumab maintenance ofatumumab every 2 months, with initial response but rising counts September 2012  (4) status-post unrelated donor stem-cell transplant 02/24/2012 at the [facility]  (a) conditioning regimen consisted of fludarabine + TBI at 200 cGy, followed by rituximab x27;  (b) CMV reactivation x3 (patient CMV positive, donor negative), s/p ganciclovir treatment; 3d reactivation August 2013, s/p gancyclovir, with negative PCR mid-September 2013; last gancyclovir dose 10/06/2012  (c) GVHD: involving gut and skin, treated with steroids, tacrolimus and MMF  (d) atrial fibrillation: resolved on amiodarone  (e) steroid-induced myopathy: improving until current crisis (f) hypomagnesemia: resolved with discontinuation of gancyclovir (g) hypogammaglobulinemia: s/p IVIG most recently 10/01/2012; IgG 617 on11/20/2013 (h) elevated triglycerides (606 on 07/14/2012)  (i) adrenal insufficiency: on prednisone  (j) pancytopenia; off growth factors (5) restaging studies September 2013 including CT scans, flow cytometry, and bone marrow biopsy, showed no evidence of residual chronic lymphoid leukemia.  (6) now with a skin rash, mouth changes, severe diarrhea and gastric/duodenal/colonic biopsies 11/17/2012 c/w GVHD grade 2  Plan: Timothy [name] continues to slowly improve and I think we will be ale to get him home this week. I was hoping to switch his tacrolimus to oral today, that being the last medication to be  switched, but I really do not want to go backwards, and he is still not having anything like normal bowel movements. We're going to go with the current treatment 1 more day, and reassess tomorrow. I encouraged him to walk at least 6 times today. Full code  Takoda Janowiak C 11/30/2012

## 2012-11-30 NOTE — Evaluation (Signed)
Physical Therapy Evaluation Patient Details Name: Timothy [name] MRN: [id number] DOB: 1946-09-18 Today's Date: 11/30/2012 Time: 4098-1191 PT Time Calculation (min): 15 min  PT Assessment / Plan / Recommendation Clinical Impression  pT. WAS ADMITTED 11/26 with deconditioning, , h/o CLL. allogenic stem cell transplant. Pt. has been ambulating in halls and wants to increase distance. Pt. will benefit from PT while in acute care.     PT Assessment  Patient needs continued PT services    Follow Up Recommendations  No PT follow up    Does the patient have the potential to tolerate intense rehabilitation      Barriers to Discharge        Equipment Recommendations       Recommendations for Other Services     Frequency Min 3X/week    Precautions / Restrictions Precautions Precautions: Fall   Pertinent Vitals/Pain       Mobility  Bed Mobility Bed Mobility: Supine to Sit;Sit to Supine Right Sidelying to Sit: 4: Min guard;With rails;HOB flat (for legs onto bed.) Sit to Supine: 4: Min assist Details for Bed Mobility Assistance: assistance for legs onto bed. Transfers Transfers: Sit to Stand;Stand to Sit Sit to Stand: 4: Min guard;From bed Stand to Sit: 4: Min guard;To bed Ambulation/Gait Ambulation/Gait Assistance: 4: Min guard Ambulation Distance (Feet): 400 Feet Assistive device:  (IV pole.) Ambulation/Gait Assistance Details: pt requires some ue SUPPORT TODAY. DECLINED TO TRY rw AT THIS TIME. Gait Pattern: Step-through pattern    Shoulder Instructions     Exercises     PT Diagnosis: Difficulty walking;Generalized weakness  PT Problem List: Decreased strength;Decreased activity tolerance;Decreased mobility PT Treatment Interventions: DME instruction;Gait training;Stair training;Functional mobility training;Therapeutic activities;Therapeutic exercise;Patient/family education   PT Goals Acute Rehab PT Goals PT Goal Formulation: With patient/family Time For Goal  Achievement: 12/14/12 Potential to Achieve Goals: Good Pt will go Supine/Side to Sit: Independently PT Goal: Supine/Side to Sit - Progress: Goal set today Pt will go Sit to Supine/Side: Independently PT Goal: Sit to Supine/Side - Progress: Goal set today Pt will go Sit to Stand: with modified independence PT Goal: Sit to Stand - Progress: Goal set today Pt will go Stand to Sit: with modified independence PT Goal: Stand to Sit - Progress: Goal set today Pt will Ambulate: >150 feet;with supervision;with least restrictive assistive device PT Goal: Ambulate - Progress: Goal set today Pt will Go Up / Down Stairs: 6-9 stairs;Flight;with rail(s) PT Goal: Up/Down Stairs - Progress: Goal set today  Visit Information  Last PT Received On: 11/30/12 Assistance Needed: +1    Subjective Data  Subjective: i am ready to walk. Patient Stated Goal:  to return ome. baack to my walkig.   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 Left Lower Extremity Assessment LLE ROM/Strength/Tone: Deficits;WFL for tasks assessed LLE ROM/Strength/Tone Deficits: edema present. LLE Sensation: WFL - Light Touch Trunk Assessment Trunk Assessment: Normal   Balance    End of Session PT - End of Session Activity Tolerance: Patient tolerated treatment well Patient left: in bed;with call bell/phone within reach;with family/visitor present  GP     Rada Hay 11/30/2012, 2:49 PM  [phone]

## 2012-11-30 NOTE — Evaluation (Signed)
Occupational Therapy Evaluation Patient Details Name: Timothy [name] MRN: [id number] DOB: 12-17-1946 Today's Date: 11/30/2012 Time: 4098-1191 OT Time Calculation (min): 27 min  OT Assessment / Plan / Recommendation Clinical Impression  This 66 year old man with CLL, s/p allogenic transplant is admitted with GVHD.  he presents with deconditioning and decreased hand strength which impairs adls.  He is appropriate for skilled OT to reach a supervision to mod I level with adls.      OT Assessment  Patient needs continued OT Services    Follow Up Recommendations  Home health OT    Barriers to Discharge      Equipment Recommendations   (likely none by OT)    Recommendations for Other Services    Frequency  Min 2X/week    Precautions / Restrictions Precautions Precautions: Fall Restrictions Weight Bearing Restrictions: No   Pertinent Vitals/Pain No pain    ADL  Grooming: Performed;Set up;Teeth care Where Assessed - Grooming: Unsupported sitting Lower Body Bathing: Simulated;Min guard Where Assessed - Lower Body Bathing: Supported sit to stand Lower Body Dressing: Simulated;Min guard Where Assessed - Lower Body Dressing: Supported sit to Pharmacist, hospital: Performed;Min guard Statistician Method: Sit to Barista: Materials engineer and Hygiene: Simulated;Supervision/safety Where Assessed - Engineer, mining and Hygiene: Sit to stand from 3-in-1 or toilet Transfers/Ambulation Related to ADLs: ambulated to bathroom with plan to stand at sink for teeth. Pt received a phone call and was shaky standing while talking.  Performed grooming sitting.   ADL Comments: Pt had difficulty opening toothpaste:  has slight tremor in bil UES  educated on energy conservation and gave him handout    OT Diagnosis: Generalized weakness  OT Problem List: Decreased strength;Decreased activity tolerance;Impaired balance (sitting  and/or standing);Decreased coordination OT Treatment Interventions: Self-care/ADL training;Therapeutic exercise;DME and/or AE instruction;Therapeutic activities;Patient/family education;Energy conservation;Balance training   OT Goals Acute Rehab OT Goals OT Goal Formulation: With patient Time For Goal Achievement: 12/14/12 Potential to Achieve Goals: Good ADL Goals Pt Will Perform Grooming: with supervision;Standing at sink (x 2 tasks) ADL Goal: Grooming - Progress: Goal set today Pt Will Transfer to Toilet: with supervision;Ambulation;Regular height toilet (including toileting) ADL Goal: Toilet Transfer - Progress: Goal set today Pt Will Perform Tub/Shower Transfer: Shower transfer;with supervision;Ambulation;Shower seat with back ADL Goal: Web designer - Progress: Goal set today Miscellaneous OT Goals Miscellaneous OT Goal #1: Pt will be independent with theraputty hep OT Goal: Miscellaneous Goal #1 - Progress: Goal set today Miscellaneous OT Goal #2: Pt will verbalize 3 energy conservation technicques OT Goal: Miscellaneous Goal #2 - Progress: Goal set today  Visit Information  Last OT Received On: 11/30/12 Assistance Needed: +1    Subjective Data  Subjective: I took a shower yesterday Patient Stated Goal: none stated   Prior Functioning     Home Living Lives With: Spouse Type of Home: House Home Access: Stairs to enter [employer] of Steps: 2 Entrance Stairs-Rails: Right Home Layout: Two level;Bed/bath upstairs Bathroom Shower/Tub: Health visitor: Standard Home Adaptive Equipment: Shower chair with back;Other (comment) (uses hiking poles to walk with) Prior Function Level of Independence: Independent with assistive device(s) Comments: college professor Communication Communication: No difficulties Dominant Hand: Left         Vision/Perception     Cognition  Overall Cognitive Status: Appears within functional limits for  tasks assessed/performed Arousal/Alertness: Awake/alert Orientation Level: Oriented X4 / Intact Behavior During Session: [facility] for tasks performed  Extremity/Trunk Assessment Right Upper Extremity Assessment RUE ROM/Strength/Tone: Within functional levels (bil hands 4-/5 strength) Left Upper Extremity Assessment LUE ROM/Strength/Tone: Within functional levels (4-5 hand grip; 1st-3rd digits weaker)     Mobility Bed Mobility Bed Mobility: Rolling Right;Right Sidelying to Sit Rolling Right: 5: Supervision Right Sidelying to Sit: 4: Min assist;HOB flat (without rail) Transfers Transfers: Sit to Stand Sit to Stand: 4: Min guard     Shoulder Instructions     Exercise     Balance     End of Session OT - End of Session Activity Tolerance: Patient limited by fatigue Patient left: in chair;with call bell/phone within reach  GO     Eustace Hur 11/30/2012, 9:08 AM Marica Otter, OTR/L [phone] 11/30/2012

## 2012-12-01 DIAGNOSIS — R195 Other fecal abnormalities: Secondary | ICD-10-CM

## 2012-12-01 LAB — CBC WITH DIFFERENTIAL/PLATELET
Basophils Absolute: 0 10*3/uL (ref 0.0–0.1)
Basophils Relative: 0 % (ref 0–1)
Eosinophils Relative: 0 % (ref 0–5)
Lymphocytes Relative: 26 % (ref 12–46)
MCHC: 35.9 g/dL (ref 30.0–36.0)
MCV: 84.6 fL (ref 78.0–100.0)
Platelets: 95 10*3/uL — ABNORMAL LOW (ref 150–400)
RDW: 15.9 % — ABNORMAL HIGH (ref 11.5–15.5)
WBC: 4.2 10*3/uL (ref 4.0–10.5)

## 2012-12-01 LAB — COMPREHENSIVE METABOLIC PANEL
AST: 24 U/L (ref 0–37)
Albumin: 1.6 g/dL — ABNORMAL LOW (ref 3.5–5.2)
BUN: 15 mg/dL (ref 6–23)
Calcium: 7.3 mg/dL — ABNORMAL LOW (ref 8.4–10.5)
Chloride: 102 mEq/L (ref 96–112)
Creatinine, Ser: 0.87 mg/dL (ref 0.50–1.35)
Total Bilirubin: 0.4 mg/dL (ref 0.3–1.2)
Total Protein: 3.2 g/dL — ABNORMAL LOW (ref 6.0–8.3)

## 2012-12-01 LAB — MAGNESIUM: Magnesium: 1.3 mg/dL — ABNORMAL LOW (ref 1.5–2.5)

## 2012-12-01 MED ORDER — MAGNESIUM SULFATE IN D5W 10-5 MG/ML-% IV SOLN
1.0000 g | Freq: Once | INTRAVENOUS | Status: AC
  Administered 2012-12-01: 1 g via INTRAVENOUS
  Filled 2012-12-01: qty 100

## 2012-12-01 MED ORDER — TACROLIMUS 1 MG PO CAPS
1.5000 mg | ORAL_CAPSULE | Freq: Two times a day (BID) | ORAL | Status: DC
  Administered 2012-12-01 – 2012-12-03 (×5): 1.5 mg via ORAL
  Filled 2012-12-01 (×6): qty 1

## 2012-12-01 NOTE — Progress Notes (Signed)
Timothy [name]   DOB:1946/05/21   ZO#:[id number]   JWJ#:[id number]  Subjective: Strother had 3 BMs yesterday (counting midnight to midnight); he had none from 10 AM yesterday to bedtime; then he had a "messy" one at abut 2 AM this morning. The BMs are still loose, but no longer watery. He only ambulated in halls once yesterday--that was with PT; he felt tired, but not dizzy. No mouth problems, no joint issues, no rash except on face. Rest of ROS today negative. No family in room  Objective: middle aged white man examined in bed  Filed Vitals:   12/01/12 1304  BP: 105/52  Pulse: 76  Temp: 98.3 F (36.8 C)  Resp: 16    Body mass index is 29.44 kg/(m^2).   Face splotchy--a few mildly erythematous nonpalpable areas over forehead, cheeks  Sclerae unicteric; eyelids minimally irritated  Oropharynx clear  No peripheral adenopathy  Lungs no rales or rhonchi, good excursion bilaterally  Heart regular rate and rhythm, no murmur appreciated  Abdomen soft, NT, +BS  MSK 2+ bilateral ankle edema; joints flexible;   Neuro nonfocal, A&O x3, positive affect  Skin: rash resolved; normal skin turgor  Labs:  Tacrolimus level drawn 12/09 in the lower acceptable level  Lab Results  Component Value Date   WBC 4.2 12/01/2012   HGB 12.4* 12/01/2012   HCT 34.5* 12/01/2012   MCV 84.6 12/01/2012   PLT 95* 12/01/2012   NEUTROABS 2.5 12/01/2012    Urine Studies No results found for this basename: UACOL:2,UAPR:2,USPG:2,UPH:2,UTP:2,UGL:2,UKET:2,UBIL:2,UHGB:2,UNIT:2,UROB:2,ULEU:2,UEPI:2,UWBC:2,URBC:2,UBAC:2,CAST:2,CRYS:2,UCOM:2,BILUA:2 in the last 72 hours  Basic Metabolic Panel:  Lab 12/01/12 6213 11/30/12 0520 11/29/12 0630 11/28/12 0610 11/27/12 0500  NA 131* 131* 131* 133* 132*  K 3.6 3.7 -- -- --  CL 102 103 104 105 106  CO2 21 22 22 20 19   GLUCOSE 85 87 108* 103* 118*  BUN 15 16 17 17 16   CREATININE 0.87 0.84 0.83 0.80 0.80  CALCIUM 7.3* 7.6* 7.6* 7.7* 7.5*  MG 1.3* 1.4* 1.5 1.6 1.5  PHOS -- -- --  -- --   GFR Estimated Creatinine Clearance: 90 ml/min (by C-G formula based on Cr of 0.87). Liver Function Tests:  Lab 12/01/12 0535 11/30/12 0520 11/29/12 0630 11/28/12 0610 11/27/12 0500  AST 24 25 21 25 22   ALT 30 35 34 36 31  ALKPHOS 55 48 46 44 43  BILITOT 0.4 0.4 0.3 0.4 0.4  PROT 3.2* 3.0* 3.1* 3.1* 3.0*  ALBUMIN 1.6* 1.7* 1.7* 1.8* 1.7*   No results found for this basename: LIPASE:5,AMYLASE:5 in the last 168 hours No results found for this basename: AMMONIA:5 in the last 168 hours Coagulation profile No results found for this basename: INR:5,PROTIME:5 in the last 168 hours  CBC:  Lab 12/01/12 0535 11/30/12 0520 11/29/12 0630 11/28/12 0610 11/27/12 0500  WBC 4.2 3.0* 3.5* 3.0* 3.2*  NEUTROABS 2.5 1.4* 2.0 1.7 1.9  HGB 12.4* 11.2* 10.4* 10.6* 10.8*  HCT 34.5* 31.5* 28.8* 29.1* 30.0*  MCV 84.6 85.4 84.2 84.1 84.7  PLT 95* 90* 96* 105* 112*   Cardiac Enzymes: No results found for this basename: CKTOTAL:5,CKMB:5,CKMBINDEX:5,TROPONINI:5 in the last 168 hours BNP: No components found with this basename: POCBNP:5 CBG: No results found for this basename: GLUCAP:5 in the last 168 hours D-Dimer No results found for this basename: DDIMER:2 in the last 72 hours Hgb A1c No results found for this basename: HGBA1C:2 in the last 72 hours Lipid Profile No results found for this basename: CHOL:2,HDL:2,LDLCALC:2,TRIG:2,CHOLHDL:2,LDLDIRECT:2 in the last 72 hours Thyroid  function studies No results found for this basename: TSH,T4TOTAL,FREET3,T3FREE,THYROIDAB in the last 72 hours Anemia work up No results found for this basename: VITAMINB12:2,FOLATE:2,FERRITIN:2,TIBC:2,IRON:2,RETICCTPCT:2 in the last 72 hours Microbiology Recent Results (from the past 240 hour(s))  CLOSTRIDIUM DIFFICILE BY PCR     Status: Normal   Collection Time   11/26/12 11:25 AM      Component Value Range Status Comment   C difficile by pcr NEGATIVE  NEGATIVE Final    Rotovirus negative   Studies:  No  results found.  Assessment: 66 y.o. Timothy [name] man with a history of well-differentiated lymphocytic lymphoma/ chronic lymphoid leukemia initially diagnosed in 2000, not requiring intervention until 2006; with multiple chromosomal abnormalities; s/p allogeneic transplant March 2013 at "the [facility]", now admitted with GVHD.  His treatment history is as follows:   (1) fludarabine/cyclophosphamide/rituximab x5 completed May 2007.  (2) rituximab for 8 doses October 2010, with partial response  (3) Leustatin and ofatumumab weekly x8 July to September 2011 followed by maintenance ofatumumab maintenance ofatumumab every 2 months, with initial response but rising counts September 2012  (4) status-post unrelated donor stem-cell transplant 02/24/2012 at the [facility]  (a) conditioning regimen consisted of fludarabine + TBI at 200 cGy, followed by rituximab x27;  (b) CMV reactivation x3 (patient CMV positive, donor negative), s/p ganciclovir treatment; 3d reactivation August 2013, s/p gancyclovir, with negative PCR mid-September 2013; last gancyclovir dose 10/06/2012  (c) GVHD: involving gut and skin, treated with steroids, tacrolimus and MMF  (d) atrial fibrillation: resolved on amiodarone  (e) steroid-induced myopathy: improving until current crisis (f) hypomagnesemia: resolved with discontinuation of gancyclovir (g) hypogammaglobulinemia: s/p IVIG most recently 10/01/2012; IgG 617 on11/20/2013 (h) elevated triglycerides (606 on 07/14/2012)  (i) adrenal insufficiency: on prednisone  (j) pancytopenia; off growth factors (5) restaging studies September 2013 including CT scans, flow cytometry, and bone marrow biopsy, showed no evidence of residual chronic lymphoid leukemia.  (6) now with a skin rash, mouth changes, severe diarrhea and gastric/duodenal/colonic biopsies 11/17/2012 c/w GVHD grade 2  Plan: Jung is better as compared to a week or 10 days ago, but he still not well. I think at this point we can  change the tacrolimus to by mouth, this would be the last medication that we are changing over. Since likely is not absorbing things very well I am upping his dose to 1.5 mg twice daily (his taper had taken him down to 1.0 mg twice daily, but of course he had not been absorbing it at all prior to admission). We will recheck a tacrolimus level in Friday morning. I am going to continue the budesonide for now, but if there is any progression of the rash beyond the face we will go back to prednisone. I strongly encouraged him to ambulate and discussed SNF for rehabilitation, but he really would prefer to go home and tells me his wife what is going to make me do it" better than a physical therapist. Verlon Au tentatively thinking of discharge Friday. He will need close followup as an outpatient. Otherwise I am replenishing his magnesium today.  Cono Gebhard C 12/01/2012

## 2012-12-02 DIAGNOSIS — R29810 Facial weakness: Secondary | ICD-10-CM

## 2012-12-02 DIAGNOSIS — E46 Unspecified protein-calorie malnutrition: Secondary | ICD-10-CM

## 2012-12-02 LAB — CBC WITH DIFFERENTIAL/PLATELET
Eosinophils Absolute: 0 10*3/uL (ref 0.0–0.7)
Eosinophils Relative: 0 % (ref 0–5)
HCT: 28.7 % — ABNORMAL LOW (ref 39.0–52.0)
Hemoglobin: 10.4 g/dL — ABNORMAL LOW (ref 13.0–17.0)
Lymphocytes Relative: 29 % (ref 12–46)
Lymphs Abs: 0.7 10*3/uL (ref 0.7–4.0)
MCH: 30.6 pg (ref 26.0–34.0)
MCV: 84.4 fL (ref 78.0–100.0)
Monocytes Absolute: 0.3 10*3/uL (ref 0.1–1.0)
Monocytes Relative: 12 % (ref 3–12)
RBC: 3.4 MIL/uL — ABNORMAL LOW (ref 4.22–5.81)
WBC: 2.5 10*3/uL — ABNORMAL LOW (ref 4.0–10.5)

## 2012-12-02 LAB — COMPREHENSIVE METABOLIC PANEL
ALT: 23 U/L (ref 0–53)
AST: 19 U/L (ref 0–37)
CO2: 20 mEq/L (ref 19–32)
Calcium: 7.1 mg/dL — ABNORMAL LOW (ref 8.4–10.5)
Chloride: 104 mEq/L (ref 96–112)
Creatinine, Ser: 0.89 mg/dL (ref 0.50–1.35)
GFR calc Af Amer: >90 mL/min (ref >90)
GFR calc non Af Amer: 87 mL/min — ABNORMAL LOW (ref >90)
Glucose, Bld: 84 mg/dL (ref 70–99)
Total Bilirubin: 0.3 mg/dL (ref 0.3–1.2)

## 2012-12-02 LAB — IGG, IGA, IGM
IgA: <6 mg/dL — ABNORMAL LOW (ref 68–379)
IgM, Serum: <5 mg/dL — ABNORMAL LOW (ref 41–251)

## 2012-12-02 LAB — MAGNESIUM: Magnesium: 1.6 mg/dL (ref 1.5–2.5)

## 2012-12-02 MED ORDER — VITAL 1.5 CAL PO LIQD
237.0000 mL | Freq: Two times a day (BID) | ORAL | Status: DC
  Administered 2012-12-02 – 2012-12-03 (×2): 237 mL via ORAL
  Filled 2012-12-02 (×5): qty 237

## 2012-12-02 MED ORDER — ENSURE PUDDING PO PUDG
1.0000 | Freq: Three times a day (TID) | ORAL | Status: DC
  Administered 2012-12-02 – 2012-12-03 (×3): 1 via ORAL
  Filled 2012-12-02 (×5): qty 1

## 2012-12-02 MED ORDER — PREDNISONE 10 MG PO TABS
10.0000 mg | ORAL_TABLET | Freq: Every day | ORAL | Status: DC
  Administered 2012-12-02 – 2012-12-03 (×2): 10 mg via ORAL
  Filled 2012-12-02 (×5): qty 1

## 2012-12-02 NOTE — Progress Notes (Signed)
Timothy [name]   DOB:[birth year]   ZO#:[id number]   JWJ#:[id number]  Subjective: Timothy [name] "feels better." he walked x 3 yesterday. His facial splotching has resolved (w/o change in meds); he tells me he drank 24 oz; having 3 BMs/ 24 hr, loose, not watery; no nausea or vomiting; still very weak; no other problems and eager to get home; wife in room  Objective: middle aged white [name] examined in bed  Filed Vitals:   12/02/12 0522  BP: 100/56  Pulse: 79  Temp: 98.3 F (36.8 C)  Resp: 16    Body mass index is 28.13 kg/(m^2).   Face clear; no significant facial or eyelid irritation  Sclerae unicteric; eyelids minimally irritated  Oropharynx entirely clear  No peripheral adenopathy  Lungs no rales or rhonchi, good excursion bilaterally  Heart regular rate and rhythm, no murmur appreciated  Abdomen soft, NT, +BS  MSK 2+ bilateral ankle edema; joints flexible;   Neuro nonfocal, A&O x3, positive affect  Skin: rash resolved; normal skin turgor  Labs:  Tacrolimus level drawn 12/09 in the lower acceptable level  Lab Results  Component Value Date   WBC 2.5* 12/02/2012   HGB 10.4* 12/02/2012   HCT 28.7* 12/02/2012   MCV 84.4 12/02/2012   PLT 71* 12/02/2012   NEUTROABS 1.5* 12/02/2012    Urine Studies No results found for this basename: UACOL:2,UAPR:2,USPG:2,UPH:2,UTP:2,UGL:2,UKET:2,UBIL:2,UHGB:2,UNIT:2,UROB:2,ULEU:2,UEPI:2,UWBC:2,URBC:2,UBAC:2,CAST:2,CRYS:2,UCOM:2,BILUA:2 in the last 72 hours  Basic Metabolic Panel:  Lab 12/02/12 6213 12/01/12 0535 11/30/12 0520 11/29/12 0630 11/28/12 0610  NA 132* 131* 131* 131* 133*  K 3.5 3.6 -- -- --  CL 104 102 103 104 105  CO2 20 21 22 22 20   GLUCOSE 84 85 87 108* 103*  BUN 18 15 16 17 17   CREATININE 0.89 0.87 0.84 0.83 0.80  CALCIUM 7.1* 7.3* 7.6* 7.6* 7.7*  MG 1.6 1.3* 1.4* 1.5 1.6  PHOS -- -- -- -- --   GFR Estimated Creatinine Clearance: 86.1 ml/min (by C-G formula based on Cr of 0.89). Liver Function Tests:  Lab 12/02/12 0540 12/01/12  0535 11/30/12 0520 11/29/12 0630 11/28/12 0610  AST 19 24 25 21 25   ALT 23 30 35 34 36  ALKPHOS 47 55 48 46 44  BILITOT 0.3 0.4 0.4 0.3 0.4  PROT 2.8* 3.2* 3.0* 3.1* 3.1*  ALBUMIN 1.4* 1.6* 1.7* 1.7* 1.8*   No results found for this basename: LIPASE:5,AMYLASE:5 in the last 168 hours No results found for this basename: AMMONIA:5 in the last 168 hours Coagulation profile No results found for this basename: INR:5,PROTIME:5 in the last 168 hours  CBC:  Lab 12/02/12 0540 12/01/12 0535 11/30/12 0520 11/29/12 0630 11/28/12 0610  WBC 2.5* 4.2 3.0* 3.5* 3.0*  NEUTROABS 1.5* 2.5 1.4* 2.0 1.7  HGB 10.4* 12.4* 11.2* 10.4* 10.6*  HCT 28.7* 34.5* 31.5* 28.8* 29.1*  MCV 84.4 84.6 85.4 84.2 84.1  PLT 71* 95* 90* 96* 105*   Cardiac Enzymes: No results found for this basename: CKTOTAL:5,CKMB:5,CKMBINDEX:5,TROPONINI:5 in the last 168 hours BNP: No components found with this basename: POCBNP:5 CBG: No results found for this basename: GLUCAP:5 in the last 168 hours D-Dimer No results found for this basename: DDIMER:2 in the last 72 hours Hgb A1c No results found for this basename: HGBA1C:2 in the last 72 hours Lipid Profile No results found for this basename: CHOL:2,HDL:2,LDLCALC:2,TRIG:2,CHOLHDL:2,LDLDIRECT:2 in the last 72 hours Thyroid function studies No results found for this basename: TSH,T4TOTAL,FREET3,T3FREE,THYROIDAB in the last 72 hours Anemia work up No results found for this basename: VITAMINB12:2,FOLATE:2,FERRITIN:2,TIBC:2,IRON:2,RETICCTPCT:2 in the  last 72 hours Microbiology Recent Results (from the past 240 hour(s))  CLOSTRIDIUM DIFFICILE BY PCR     Status: Normal   Collection Time   11/26/12 11:25 AM      Component Value Range Status Comment   C difficile by pcr NEGATIVE  NEGATIVE Final    Rotovirus negative   Studies:  No results found.  Assessment: 66 y.o. Timothy [name] with a history of well-differentiated lymphocytic lymphoma/ chronic lymphoid leukemia initially  diagnosed in 2000, not requiring intervention until 2006; with multiple chromosomal abnormalities; s/p allogeneic transplant March 2013 at [facility]", now admitted with GVHD.  His treatment history is as follows:   (1) fludarabine/cyclophosphamide/rituximab x5 completed May 2007.  (2) rituximab for 8 doses October 2010, with partial response  (3) Leustatin and ofatumumab weekly x8 July to September 2011 followed by maintenance ofatumumab maintenance ofatumumab every 2 months, with initial response but rising counts September 2012  (4) status-post unrelated donor stem-cell transplant 02/24/2012 at the [facility]  (a) conditioning regimen consisted of fludarabine + TBI at 200 cGy, followed by rituximab x27;  (b) CMV reactivation x3 (patient CMV positive, donor negative), s/p ganciclovir treatment; 3d reactivation August 2013, s/p gancyclovir, with negative PCR mid-September 2013; last gancyclovir dose 10/06/2012  (c) GVHD: involving gut and skin, treated with steroids, tacrolimus and MMF  (d) atrial fibrillation: resolved on amiodarone  (e) steroid-induced myopathy: improving until current crisis (f) hypomagnesemia: resolved with discontinuation of gancyclovir (g) hypogammaglobulinemia: s/p IVIG most recently 10/01/2012; IgG 617 on11/20/2013 (h) elevated triglycerides (606 on 07/14/2012)  (i) adrenal insufficiency: on prednisone  (j) pancytopenia; off growth factors (5) restaging studies September 2013 including CT scans, flow cytometry, and bone marrow biopsy, showed no evidence of residual chronic lymphoid leukemia.  (6) now with a skin rash, mouth changes, severe diarrhea and gastric/duodenal/colonic biopsies 11/17/2012 c/w GVHD grade 2 (7) severe malnutrition (8) pancytopenia  Plan: Timothy [name] is clearly turning the corner; at the same time he remains severely malnourished and his counts are dropping. All his meds are now po. I will check IgG and testosterone today and tacrolimus level tomorrow.  Will add prednisone 10 mg po Q AM to regimen. Likely home tomorrow--will need daily IVF and labs twice weekly, BSC, and visits weekly to clinic. Will discuss w. Dr Collier Salina re. Whether we should consider TNA temporarily  MAGRINAT,GUSTAV C 12/02/2012

## 2012-12-02 NOTE — Progress Notes (Signed)
Nutrition Follow-up  Intervention:  - D/C Ensure Complete. - Ensure pudding TID - Vital 1.5, 1 can orally BID - Discussed nutrition therapy for diarrhea with pt and wife - explained sources of food that can thicken stool. Also discussion high protein/calorie nutrition therapy to promote weight gain. Provided handouts of this information.  - Will continue to monitor.   Diet Order: Regular, Ensure Complete daily  - Reviewed foods that pt has been ordering which include bland, low fiber choices. Pt reports his appetite is slightly better with pt consuming at least 50% of meals, noted recently this has improved to 60-95% of meals. Pt reports diarrhea has improved, only 3 stools yesterday and pt reports only 1 stool so far today. Pt's weight trending down from 198 pounds on 11/25/12, now 185 pounds, however still up 13 pounds since admission. Weight trend not reflective of nutritional status r/t changes in fluid and RLE and LLE edema. Pt reports he has not been drinking the Ensure because he doesn't like the taste. Noted MD had written possible plans for TPN, however unsure if this is necessary r/t pt eating better with improved diarrhea. TPN has increased risk of infections. Will order elemental oral nutrition supplement, Vital 1.5, to help with diarrhea.   Meds: Scheduled Meds:   . acyclovir  400 mg Oral BID  . antiseptic oral rinse  15 mL Mouth Rinse BID  . budesonide  9 mg Oral q morning - 10a  . feeding supplement  237 mL Oral Q1400  . fluconazole  100 mg Oral Daily  . labetalol  200 mg Oral BID  . levofloxacin  500 mg Oral Daily  . lisinopril  10 mg Oral QHS  . [COMPLETED] magnesium sulfate 1 - 4 g bolus IVPB  1 g Intravenous Once  . multivitamin with minerals  1 tablet Oral Daily  . pantoprazole  40 mg Oral Daily  . sertraline  100 mg Oral QODAY  . sertraline  50 mg Oral QODAY  . sulfamethoxazole-trimethoprim  1 tablet Oral Daily  . tacrolimus  1.5 mg Oral BID  . [DISCONTINUED]  tacrolimus (PROGRAF) IV  0.009 mg/kg/day Intravenous Q24H   Continuous Infusions:   . sodium chloride 20 mL/hr at 11/29/12 1900   PRN Meds:.acetaminophen, acetaminophen, chlorhexidine, diphenoxylate-atropine, loperamide, ondansetron (ZOFRAN) IV, prochlorperazine, witch hazel-glycerin, zolpidem   CMP     Component Value Date/Time   NA 132* 12/02/2012 0540   NA 131* 11/10/2012 1410   K 3.5 12/02/2012 0540   K 4.6 11/10/2012 1410   CL 104 12/02/2012 0540   CL 104 11/10/2012 1410   CO2 20 12/02/2012 0540   CO2 22 11/10/2012 1410   GLUCOSE 84 12/02/2012 0540   GLUCOSE 93 11/10/2012 1410   BUN 18 12/02/2012 0540   BUN 20.0 11/10/2012 1410   CREATININE 0.89 12/02/2012 0540   CREATININE 1.1 11/10/2012 1410   CALCIUM 7.1* 12/02/2012 0540   CALCIUM 8.9 11/10/2012 1410   PROT 2.8* 12/02/2012 0540   PROT 5.3* 11/10/2012 1410   ALBUMIN 1.4* 12/02/2012 0540   ALBUMIN 3.0* 11/10/2012 1410   AST 19 12/02/2012 0540   AST 44* 11/10/2012 1410   ALT 23 12/02/2012 0540   ALT 39 11/10/2012 1410   ALKPHOS 47 12/02/2012 0540   ALKPHOS 67 11/10/2012 1410   BILITOT 0.3 12/02/2012 0540   BILITOT 0.35 11/10/2012 1410   GFRNONAA 87* 12/02/2012 0540   GFRAA >90 12/02/2012 0540    CBG (last 3)  No results found for this  basename: GLUCAP:3 in the last 72 hours   Intake/Output Summary (Last 24 hours) at 12/02/12 0855 Last data filed at 12/02/12 0600  Gross per 24 hour  Intake    640 ml  Output      0 ml  Net    640 ml    Estimated needs:   1750-2100 calories 85-105g protein  Nutrition Dx: Altered GI function - improved   Goal:  1. Resolution of diarrhea - not met  2. Pt to consume >75% of meals/supplements - not met consistenly  Monitor: Weights, labs, intake, diarrhea   Levon Hedger MS, RD, LDN  [phone] Pager  [phone] After Hours Pager

## 2012-12-02 NOTE — Progress Notes (Signed)
OT Cancellation Note  Patient Details Name: Lark Runk MRN: [id number] DOB: 06-Nov-1946   Cancelled Treatment:    Reason Eval/Treat Not Completed: Other (comment) (Pt refused this am.)  Safari Cinque A OTR/L [phone] 12/02/2012, 3:32 PM

## 2012-12-03 ENCOUNTER — Encounter: Payer: Self-pay | Admitting: *Deleted

## 2012-12-03 LAB — COMPREHENSIVE METABOLIC PANEL
ALT: 19 U/L (ref 0–53)
Alkaline Phosphatase: 49 U/L (ref 39–117)
BUN: 19 mg/dL (ref 6–23)
Chloride: 102 mEq/L (ref 96–112)
GFR calc Af Amer: >90 mL/min (ref >90)
Glucose, Bld: 90 mg/dL (ref 70–99)
Potassium: 3.6 mEq/L (ref 3.5–5.1)
Sodium: 131 mEq/L — ABNORMAL LOW (ref 135–145)
Total Bilirubin: 0.2 mg/dL — ABNORMAL LOW (ref 0.3–1.2)

## 2012-12-03 LAB — CBC WITH DIFFERENTIAL/PLATELET
Basophils Relative: 0 % (ref 0–1)
Eosinophils Relative: 0 % (ref 0–5)
HCT: 28.3 % — ABNORMAL LOW (ref 39.0–52.0)
Hemoglobin: 10 g/dL — ABNORMAL LOW (ref 13.0–17.0)
Lymphs Abs: 1.5 10*3/uL (ref 0.7–4.0)
MCH: 29.9 pg (ref 26.0–34.0)
MCV: 84.5 fL (ref 78.0–100.0)
Monocytes Absolute: 0.3 10*3/uL (ref 0.1–1.0)
Neutro Abs: 0.9 10*3/uL — ABNORMAL LOW (ref 1.7–7.7)
RBC: 3.35 MIL/uL — ABNORMAL LOW (ref 4.22–5.81)

## 2012-12-03 LAB — TACROLIMUS, BLOOD: Tacrolimus Lvl: >60 ng/mL — ABNORMAL HIGH (ref 5.0–20.0)

## 2012-12-03 MED ORDER — PREDNISONE 10 MG PO TABS: 5.0000 mg | ORAL_TABLET | Freq: Every day | ORAL | Status: DC

## 2012-12-03 MED ORDER — VITAL 1.5 CAL PO LIQD: 237.0000 mL | Freq: Two times a day (BID) | ORAL | Status: DC

## 2012-12-03 MED ORDER — TESTOSTERONE 5 MG/24HR TD PT24: 1.0000 | MEDICATED_PATCH | Freq: Every day | TRANSDERMAL | Status: DC

## 2012-12-03 MED ORDER — IMMUNE GLOBULIN (HUMAN) 10 GM/100ML IV SOLN: 1.0000 g/kg | INTRAVENOUS | Status: DC

## 2012-12-03 MED ORDER — DIPHENOXYLATE-ATROPINE 2.5-0.025 MG PO TABS: ORAL_TABLET | ORAL | Status: DC

## 2012-12-03 MED ORDER — LOPERAMIDE HCL 2 MG PO CAPS: 2.0000 mg | ORAL_CAPSULE | ORAL | Status: DC | PRN

## 2012-12-03 MED ORDER — TESTOSTERONE 4 MG/24HR TD PT24: 1.0000 | MEDICATED_PATCH | Freq: Every day | TRANSDERMAL | Status: DC

## 2012-12-03 MED ORDER — OMEPRAZOLE 20 MG PO CPDR: 40.0000 mg | DELAYED_RELEASE_CAPSULE | Freq: Every day | ORAL | Status: DC

## 2012-12-03 MED ORDER — IMMUNE GLOBULIN (HUMAN) 5 GM/100ML IV SOLN
1.0000 g/kg | Freq: Every day | INTRAVENOUS | Status: DC
  Filled 2012-12-03: qty 100

## 2012-12-03 MED ORDER — HEPARIN SOD (PORK) LOCK FLUSH 100 UNIT/ML IV SOLN
INTRAVENOUS | Status: AC
  Filled 2012-12-03: qty 5

## 2012-12-03 MED ORDER — FLUCONAZOLE 100 MG PO TABS: 100.0000 mg | ORAL_TABLET | Freq: Every day | ORAL | Status: DC

## 2012-12-03 MED ORDER — LISINOPRIL 10 MG PO TABS: 5.0000 mg | ORAL_TABLET | Freq: Every day | ORAL | Status: DC

## 2012-12-03 MED ORDER — TACROLIMUS 1 MG PO CAPS: ORAL_CAPSULE | ORAL | Status: DC

## 2012-12-03 MED ORDER — ACYCLOVIR 400 MG PO TABS: 400.0000 mg | ORAL_TABLET | Freq: Two times a day (BID) | ORAL | Status: DC

## 2012-12-03 MED ORDER — ASPIRIN 81 MG PO CHEW: 81.0000 mg | CHEWABLE_TABLET | Freq: Every day | ORAL | Status: DC

## 2012-12-03 MED ORDER — TESTOSTERONE 4 MG/24HR TD PT24
1.0000 | MEDICATED_PATCH | Freq: Every day | TRANSDERMAL | Status: DC
  Administered 2012-12-03: 1 via TRANSDERMAL
  Filled 2012-12-03: qty 1

## 2012-12-03 MED ORDER — ASPIRIN 81 MG PO CHEW
81.0000 mg | CHEWABLE_TABLET | Freq: Every day | ORAL | Status: DC
  Administered 2012-12-03: 81 mg via ORAL
  Filled 2012-12-03: qty 1

## 2012-12-03 MED ORDER — IMMUNE GLOBULIN (HUMAN) 5 GM/100ML IV SOLN
1.0000 g/kg | Freq: Every day | INTRAVENOUS | Status: DC
  Administered 2012-12-03: 85 g via INTRAVENOUS
  Filled 2012-12-03: qty 100

## 2012-12-03 MED ORDER — SULFAMETHOXAZOLE-TMP DS 800-160 MG PO TABS: 1.0000 | ORAL_TABLET | ORAL | Status: DC

## 2012-12-03 MED ORDER — LEVOFLOXACIN 500 MG PO TABS: 500.0000 mg | ORAL_TABLET | Freq: Every day | ORAL | Status: DC

## 2012-12-03 MED ORDER — BUDESONIDE 3 MG PO CP24: 9.0000 mg | ORAL_CAPSULE | Freq: Every morning | ORAL | Status: DC

## 2012-12-03 MED ORDER — LABETALOL HCL 200 MG PO TABS: 100.0000 mg | ORAL_TABLET | Freq: Two times a day (BID) | ORAL | Status: DC

## 2012-12-03 NOTE — Progress Notes (Signed)
RECEIVED A FAX FROM [facility] CONCERNING A PRIOR AUTHORIZATION FOR ANDRODERM. THIS REQUEST WAS GIVEN TO ELIZABETH SUTTON,RN.

## 2012-12-03 NOTE — Discharge Summary (Signed)
Physician Discharge Summary  Patient ID: Timothy [name] MRN: [id number] DOB/AGE: 66-Oct-1947 66 y.o.  Admit date: 11/16/2012 Discharge date: 12/09/2012 Primary Care Physician:  Kari Baars, MD   Discharge Diagnoses:  Chronic lymphoid leukemia status post allogenic transplantation, chronic graft-versus-host disease, poorly controlled diarrhea, severe deconditioning, severe malnutrition, rash, anemia, hypomagnesemia, low white cell count, thrombocytopenia  Discharge Medications:    Medication List     As of 12/12/2012 11:22 AM    STOP taking these medications         dexamethasone 0.5 MG/5ML elixir      dextrose 5 % SOLN 50 mL with magnesium sulfate 50 % SOLN 1 g      heparin lock flush 100 UNIT/ML Soln      levofloxacin 750 MG tablet   Commonly known as: LEVAQUIN      meclizine 25 MG tablet   Commonly known as: ANTIVERT      NEUPOGEN 480 MCG/1.6ML injection   Generic drug: filgrastim      omeprazole 20 MG capsule   Commonly known as: PRILOSEC      Sodium Chloride Flush 0.9 % Soln      tacrolimus 1 MG capsule   Commonly known as: PROGRAF      TAKE these medications         acyclovir 400 MG tablet   Commonly known as: ZOVIRAX   Take 1 tablet (400 mg total) by mouth 2 (two) times daily.      antiseptic oral rinse Liqd   15 mLs by Mouth Rinse route 2 times daily at 12 noon and 4 pm.      aspirin 81 MG chewable tablet   Chew 1 tablet (81 mg total) by mouth daily.      budesonide 3 MG 24 hr capsule   Commonly known as: ENTOCORT EC   Take 3 capsules (9 mg total) by mouth every morning.      CENTRUM SILVER ADULT 50+ Tabs   Take 1 tablet by mouth every evening.      cholecalciferol 1000 UNITS tablet   Commonly known as: VITAMIN D   Take 1,000 Units by mouth every evening.      CITRACAL PO   Take 1,200 mg by mouth 2 (two) times daily.      diphenoxylate-atropine 2.5-0.025 MG per tablet   Commonly known as: LOMOTIL   Take 1 to 2 tablets as needed  for diarrhea, maximum 6 per day      feeding supplement (VITAL 1.5 CAL) Liqd   Take 237 mLs by mouth 2 (two) times daily.      Fish Oil 300 MG Caps   Take 600 mg by mouth every morning.      loperamide 2 MG capsule   Commonly known as: IMODIUM   Take 1 capsule (2 mg total) by mouth as needed for diarrhea or loose stools (Given with each loose stool not to exceed 4 tablets daily).      prochlorperazine 10 MG tablet   Commonly known as: COMPAZINE   Take 10 mg by mouth every 6 (six) hours as needed. Nausea      sertraline 50 MG tablet   Commonly known as: ZOLOFT   Take 50-100 mg by mouth daily. Takes 50 mg one day and 100 mg the next day      testosterone 4 MG/24HR Pt24 patch   Commonly known as: ANDRODERM   Place 1 patch onto the skin daily.  Disposition and Follow-up: Transferred to the rehabilitation service  Significant Diagnostic Studies:  X-ray Chest Pa And Lateral   12/05/2012  *RADIOLOGY REPORT*  Clinical Data: Decreased breath sounds right lung.  Bone marrow transplant patient.  History of CLL.  CHEST - 2 VIEW  Comparison: 11/16/2012  Findings: Right IJ central venous catheter is unchanged.  Lungs are adequately inflated with mild opacification of the left base compatible with a worsening small left effusion.  Cannot exclude associated atelectasis or infection in the left base. Cardiomediastinal silhouette is unchanged.  There is mild loss of height of a lower thoracic vertebrae without significant change.  IMPRESSION: Worsening opacification in the left base compatible a small left effusion as cannot exclude associated atelectasis / infection in the left base.  Right-sided central venous catheter unchanged.  Stable mild compression deformity of the mid to lower thoracic spine.   Original Report Authenticated By: Elberta Fortis, M.D.    X-ray Chest Pa And Lateral   11/16/2012  *RADIOLOGY REPORT*  Clinical Data: Shortness of breath and weakness  CHEST - 2 VIEW   Comparison: 08/04/2012  Findings: The heart and pulmonary vascularity are within normal limits.  The lungs are clear bilaterally.  The previously seen bibasilar changes and effusions have resolved in the interval.  A right-sided central venous catheter is again noted.  IMPRESSION: No acute abnormality noted.   Original Report Authenticated By: Alcide Clever, M.D.     Discharge Laboratory Values: Basic Metabolic Panel:  Lab 12/12/12 1610 12/11/12 9604 12/10/12 0535 12/09/12 0545 12/08/12 0550 12/07/12 0530 12/06/12 0635 12/05/12 1507  NA 133* 134* 134* 132* 130* -- -- --  K 3.7 3.9 -- -- -- -- -- --  CL 107 107 109 109 106 -- -- --  CO2 15* 17* 18* 19 19 -- -- --  GLUCOSE 75 70 82 77 80 -- -- --  BUN 22 19 18 19 22  -- -- --  CREATININE 1.21 1.18 1.04 1.03 1.04 -- -- --  CALCIUM 7.2* 7.2* 7.3* 6.7* 7.2* -- -- --  MG -- -- -- -- -- 1.6 1.5 1.6  PHOS -- -- -- -- -- 2.7 -- --   GFR Estimated Creatinine Clearance: 64.2 ml/min (by C-G formula based on Cr of 1.21). Liver Function Tests:  Lab 12/12/12 0700 12/11/12 0712 12/10/12 0535 12/09/12 0545 12/08/12 0550  AST 18 18 16 13 12   ALT 15 14 13 11 12   ALKPHOS 65 67 68 63 62  BILITOT 0.2* 0.2* 0.2* 0.2* 0.2*  PROT 3.2* 3.1* 3.3* 3.3* 3.5*  ALBUMIN 1.3* 1.3* 1.4* 1.3* 1.4*   No results found for this basename: LIPASE:5,AMYLASE:5 in the last 168 hours No results found for this basename: AMMONIA:5 in the last 168 hours Coagulation profile No results found for this basename: INR:5,PROTIME:5 in the last 168 hours  CBC:  Lab 12/12/12 0700 12/11/12 0712 12/10/12 0535 12/09/12 1641 12/09/12 0545 12/08/12 0550  WBC 4.5 4.8 5.2 4.6 4.4 --  NEUTROABS 1.0* 1.7 2.8 -- 2.9 5.8  HGB 10.3* 10.9* 10.6* 10.8* 9.9* --  HCT 28.8* 30.2* 29.5* 30.2* 28.0* --  MCV 84.0 84.1 84.0 84.1 84.1 --  PLT 68* 70* 66* 72* 86* --   Cardiac Enzymes: No results found for this basename: CKTOTAL:5,CKMB:5,CKMBINDEX:5,TROPONINI:5 in the last 168 hours BNP: No components  found with this basename: POCBNP:5 CBG: No results found for this basename: GLUCAP:5 in the last 168 hours D-Dimer No results found for this basename: DDIMER:2 in the last 72 hours Hgb  A1c No results found for this basename: HGBA1C:2 in the last 72 hours Lipid Profile No results found for this basename: CHOL:2,HDL:2,LDLCALC:2,TRIG:2,CHOLHDL:2,LDLDIRECT:2 in the last 72 hours Thyroid function studies No results found for this basename: TSH,T4TOTAL,FREET3,T3FREE,THYROIDAB in the last 72 hours Anemia work up No results found for this basename: VITAMINB12:2,FOLATE:2,FERRITIN:2,TIBC:2,IRON:2,RETICCTPCT:2 in the last 72 hours Microbiology Recent Results (from the past 240 hour(s))  CLOSTRIDIUM DIFFICILE BY PCR     Status: Normal   Collection Time   12/06/12  2:30 AM      Component Value Range Status Comment   C difficile by pcr NEGATIVE  NEGATIVE Final      Brief H and P: For complete details please refer to admission H and P, but in brief, the patient was admitted after a brief stay at home, where it was found his wife was unable to care for him. He requires 24 hour support at this time. See hospital course for further details  Physical Exam at Discharge: BP 117/58  Pulse 81  Temp 97.8 F (36.6 C) (Oral)  Resp 18  Ht 5\' 8"  (1.727 m)  Wt 190 lb 4.1 oz (86.3 kg)  BMI 28.93 kg/m2  SpO2 95% Gen: Middle-aged white male who appears weak Cardiovascular: Regular rate and rhythm, no murmur appreciated Respiratory: No rales or rhonchi, good excursion bilaterally Gastrointestinal abdomen soft, nontender, positive bowel sounds Extremities: 1+ bilateral lower extremity edema Neuro: Nonfocal, alert and oriented x3, positive affect    Hospital Course:  Active Problems:  * No active hospital problems. *  Chronic lymphoid leukemia status post allogenic transplantation, chronic graft-versus-host disease, poorly controlled diarrhea, severe deconditioning, severe malnutrition, rash, anemia,  hypomagnesemia, low white cell count, thrombocytopenia  Diet:  As tolerated  Activity:  As tolerated; needs significant rehabilitation  Condition at Discharge:   Improved  Signed: Dr. Ruthann Cancer [phone]  12/12/2012, 11:22 AM

## 2012-12-05 ENCOUNTER — Inpatient Hospital Stay (HOSPITAL_COMMUNITY): Payer: BC Managed Care – PPO

## 2012-12-05 ENCOUNTER — Encounter (HOSPITAL_COMMUNITY): Payer: Self-pay | Admitting: Emergency Medicine

## 2012-12-05 ENCOUNTER — Other Ambulatory Visit: Payer: Self-pay | Admitting: Oncology

## 2012-12-05 ENCOUNTER — Inpatient Hospital Stay (HOSPITAL_COMMUNITY)
Admission: EM | Admit: 2012-12-05 | Discharge: 2012-12-09 | DRG: 574 | Disposition: A | Discharge status: Rehabilitation Facility | Payer: BC Managed Care – PPO | Attending: Oncology | Admitting: Oncology

## 2012-12-05 DIAGNOSIS — N182 Chronic kidney disease, stage 2 (mild): Secondary | ICD-10-CM

## 2012-12-05 DIAGNOSIS — E860 Dehydration: Secondary | ICD-10-CM

## 2012-12-05 DIAGNOSIS — C9110 Chronic lymphocytic leukemia of B-cell type not having achieved remission: Secondary | ICD-10-CM

## 2012-12-05 DIAGNOSIS — E099 Drug or chemical induced diabetes mellitus without complications: Secondary | ICD-10-CM

## 2012-12-05 DIAGNOSIS — K449 Diaphragmatic hernia without obstruction or gangrene: Secondary | ICD-10-CM

## 2012-12-05 DIAGNOSIS — I129 Hypertensive chronic kidney disease with stage 1 through stage 4 chronic kidney disease, or unspecified chronic kidney disease: Secondary | ICD-10-CM | POA: Diagnosis present

## 2012-12-05 DIAGNOSIS — R112 Nausea with vomiting, unspecified: Secondary | ICD-10-CM

## 2012-12-05 DIAGNOSIS — G729 Myopathy, unspecified: Secondary | ICD-10-CM

## 2012-12-05 DIAGNOSIS — Z6828 Body mass index (BMI) 28.0-28.9, adult: Secondary | ICD-10-CM

## 2012-12-05 DIAGNOSIS — Z7982 Long term (current) use of aspirin: Secondary | ICD-10-CM

## 2012-12-05 DIAGNOSIS — Z9221 Personal history of antineoplastic chemotherapy: Secondary | ICD-10-CM

## 2012-12-05 DIAGNOSIS — Z923 Personal history of irradiation: Secondary | ICD-10-CM

## 2012-12-05 DIAGNOSIS — D849 Immunodeficiency, unspecified: Secondary | ICD-10-CM

## 2012-12-05 DIAGNOSIS — K5790 Diverticulosis of intestine, part unspecified, without perforation or abscess without bleeding: Secondary | ICD-10-CM

## 2012-12-05 DIAGNOSIS — D61818 Other pancytopenia: Secondary | ICD-10-CM

## 2012-12-05 DIAGNOSIS — E2749 Other adrenocortical insufficiency: Secondary | ICD-10-CM | POA: Diagnosis present

## 2012-12-05 DIAGNOSIS — Z888 Allergy status to other drugs, medicaments and biological substances status: Secondary | ICD-10-CM

## 2012-12-05 DIAGNOSIS — E782 Mixed hyperlipidemia: Secondary | ICD-10-CM | POA: Diagnosis present

## 2012-12-05 DIAGNOSIS — G722 Myopathy due to other toxic agents: Secondary | ICD-10-CM | POA: Diagnosis present

## 2012-12-05 DIAGNOSIS — E785 Hyperlipidemia, unspecified: Secondary | ICD-10-CM

## 2012-12-05 DIAGNOSIS — Z9489 Other transplanted organ and tissue status: Secondary | ICD-10-CM

## 2012-12-05 DIAGNOSIS — D89811 Chronic graft-versus-host disease: Secondary | ICD-10-CM

## 2012-12-05 DIAGNOSIS — I4891 Unspecified atrial fibrillation: Secondary | ICD-10-CM

## 2012-12-05 DIAGNOSIS — Y849 Medical procedure, unspecified as the cause of abnormal reaction of the patient, or of later complication, without mention of misadventure at the time of the procedure: Secondary | ICD-10-CM | POA: Diagnosis present

## 2012-12-05 DIAGNOSIS — R42 Dizziness and giddiness: Secondary | ICD-10-CM

## 2012-12-05 DIAGNOSIS — Y92009 Unspecified place in unspecified non-institutional (private) residence as the place of occurrence of the external cause: Secondary | ICD-10-CM

## 2012-12-05 DIAGNOSIS — T8609 Other complications of bone marrow transplant: Secondary | ICD-10-CM

## 2012-12-05 DIAGNOSIS — G252 Other specified forms of tremor: Secondary | ICD-10-CM

## 2012-12-05 DIAGNOSIS — R21 Rash and other nonspecific skin eruption: Secondary | ICD-10-CM

## 2012-12-05 DIAGNOSIS — I10 Essential (primary) hypertension: Secondary | ICD-10-CM

## 2012-12-05 DIAGNOSIS — R197 Diarrhea, unspecified: Secondary | ICD-10-CM

## 2012-12-05 DIAGNOSIS — Z7901 Long term (current) use of anticoagulants: Secondary | ICD-10-CM

## 2012-12-05 DIAGNOSIS — E46 Unspecified protein-calorie malnutrition: Secondary | ICD-10-CM

## 2012-12-05 DIAGNOSIS — D801 Nonfamilial hypogammaglobulinemia: Secondary | ICD-10-CM | POA: Diagnosis present

## 2012-12-05 DIAGNOSIS — T380X5A Adverse effect of glucocorticoids and synthetic analogues, initial encounter: Secondary | ICD-10-CM | POA: Diagnosis present

## 2012-12-05 DIAGNOSIS — R894 Abnormal immunological findings in specimens from other organs, systems and tissues: Secondary | ICD-10-CM

## 2012-12-05 DIAGNOSIS — T8600 Unspecified complication of bone marrow transplant: Principal | ICD-10-CM | POA: Diagnosis present

## 2012-12-05 DIAGNOSIS — D89812 Acute on chronic graft-versus-host disease: Secondary | ICD-10-CM | POA: Diagnosis present

## 2012-12-05 DIAGNOSIS — E43 Unspecified severe protein-calorie malnutrition: Secondary | ICD-10-CM | POA: Diagnosis present

## 2012-12-05 DIAGNOSIS — IMO0002 Reserved for concepts with insufficient information to code with codable children: Secondary | ICD-10-CM

## 2012-12-05 DIAGNOSIS — C9111 Chronic lymphocytic leukemia of B-cell type in remission: Secondary | ICD-10-CM | POA: Diagnosis present

## 2012-12-05 DIAGNOSIS — E669 Obesity, unspecified: Secondary | ICD-10-CM

## 2012-12-05 DIAGNOSIS — E291 Testicular hypofunction: Secondary | ICD-10-CM | POA: Diagnosis present

## 2012-12-05 DIAGNOSIS — I4510 Unspecified right bundle-branch block: Secondary | ICD-10-CM

## 2012-12-05 DIAGNOSIS — R768 Other specified abnormal immunological findings in serum: Secondary | ICD-10-CM

## 2012-12-05 HISTORY — DX: Rash and other nonspecific skin eruption: R21

## 2012-12-05 HISTORY — DX: Chronic graft-versus-host disease: D89.811

## 2012-12-05 HISTORY — DX: Chronic lymphocytic leukemia of B-cell type not having achieved remission: C91.10

## 2012-12-05 HISTORY — DX: Other complications of bone marrow transplant: T86.09

## 2012-12-05 HISTORY — DX: Diarrhea, unspecified: R19.7

## 2012-12-05 LAB — CBC WITH DIFFERENTIAL/PLATELET
Eosinophils Relative: 0 % (ref 0–5)
HCT: 30.8 % — ABNORMAL LOW (ref 39.0–52.0)
Hemoglobin: 11.1 g/dL — ABNORMAL LOW (ref 13.0–17.0)
Lymphocytes Relative: 22 % (ref 12–46)
Lymphs Abs: 0.7 10*3/uL (ref 0.7–4.0)
MCV: 83.7 fL (ref 78.0–100.0)
Monocytes Absolute: 0.6 10*3/uL (ref 0.1–1.0)
Monocytes Relative: 21 % — ABNORMAL HIGH (ref 3–12)
RBC: 3.68 MIL/uL — ABNORMAL LOW (ref 4.22–5.81)
WBC: 3.1 10*3/uL — ABNORMAL LOW (ref 4.0–10.5)

## 2012-12-05 LAB — URINALYSIS, ROUTINE W REFLEX MICROSCOPIC
Glucose, UA: 100 mg/dL — AB
Nitrite: NEGATIVE
Specific Gravity, Urine: 1.027 (ref 1.005–1.030)
pH: 5.5 (ref 5.0–8.0)

## 2012-12-05 LAB — COMPREHENSIVE METABOLIC PANEL
CO2: 20 mEq/L (ref 19–32)
Calcium: 7.3 mg/dL — ABNORMAL LOW (ref 8.4–10.5)
Chloride: 102 mEq/L (ref 96–112)
Creatinine, Ser: 0.87 mg/dL (ref 0.50–1.35)
GFR calc Af Amer: >90 mL/min (ref >90)
GFR calc non Af Amer: 88 mL/min — ABNORMAL LOW (ref >90)
Glucose, Bld: 82 mg/dL (ref 70–99)
Total Bilirubin: 0.3 mg/dL (ref 0.3–1.2)

## 2012-12-05 LAB — URINE MICROSCOPIC-ADD ON

## 2012-12-05 MED ORDER — BIOTENE DRY MOUTH MT LIQD
15.0000 mL | Freq: Two times a day (BID) | OROMUCOSAL | Status: DC
  Administered 2012-12-07 – 2012-12-08 (×3): 15 mL via OROMUCOSAL

## 2012-12-05 MED ORDER — TACROLIMUS 1 MG PO CAPS
1.5000 mg | ORAL_CAPSULE | Freq: Two times a day (BID) | ORAL | Status: DC
  Administered 2012-12-05 – 2012-12-09 (×8): 1.5 mg via ORAL
  Filled 2012-12-05 (×9): qty 1

## 2012-12-05 MED ORDER — SERTRALINE HCL 50 MG PO TABS
50.0000 mg | ORAL_TABLET | ORAL | Status: DC
  Administered 2012-12-06 – 2012-12-08 (×2): 50 mg via ORAL
  Filled 2012-12-05 (×2): qty 1

## 2012-12-05 MED ORDER — LACTATED RINGERS IV BOLUS (SEPSIS)
1000.0000 mL | Freq: Once | INTRAVENOUS | Status: AC
  Administered 2012-12-05: 1000 mL via INTRAVENOUS

## 2012-12-05 MED ORDER — DIPHENOXYLATE-ATROPINE 2.5-0.025 MG PO TABS
1.0000 | ORAL_TABLET | ORAL | Status: DC | PRN
  Administered 2012-12-06 – 2012-12-09 (×10): 1 via ORAL
  Filled 2012-12-05 (×11): qty 1

## 2012-12-05 MED ORDER — MECLIZINE HCL 25 MG PO TABS
25.0000 mg | ORAL_TABLET | Freq: Three times a day (TID) | ORAL | Status: DC | PRN
  Filled 2012-12-05: qty 1

## 2012-12-05 MED ORDER — METHYLPREDNISOLONE SODIUM SUCC 40 MG IJ SOLR
40.0000 mg | Freq: Two times a day (BID) | INTRAMUSCULAR | Status: DC
  Administered 2012-12-05 – 2012-12-07 (×4): 40 mg via INTRAVENOUS
  Filled 2012-12-05 (×5): qty 1

## 2012-12-05 MED ORDER — ACYCLOVIR 400 MG PO TABS
400.0000 mg | ORAL_TABLET | Freq: Two times a day (BID) | ORAL | Status: DC
  Administered 2012-12-05 – 2012-12-09 (×8): 400 mg via ORAL
  Filled 2012-12-05 (×9): qty 1

## 2012-12-05 MED ORDER — FLUCONAZOLE 100 MG PO TABS
100.0000 mg | ORAL_TABLET | Freq: Every morning | ORAL | Status: DC
  Administered 2012-12-06 – 2012-12-09 (×4): 100 mg via ORAL
  Filled 2012-12-05 (×4): qty 1

## 2012-12-05 MED ORDER — TESTOSTERONE 4 MG/24HR TD PT24: 1.0000 | MEDICATED_PATCH | Freq: Every day | TRANSDERMAL | Status: DC

## 2012-12-05 MED ORDER — TRAZODONE 25 MG HALF TABLET
25.0000 mg | ORAL_TABLET | Freq: Every evening | ORAL | Status: DC | PRN
  Filled 2012-12-05: qty 1

## 2012-12-05 MED ORDER — SULFAMETHOXAZOLE-TMP DS 800-160 MG PO TABS
1.0000 | ORAL_TABLET | ORAL | Status: DC
  Administered 2012-12-06 – 2012-12-09 (×2): 1 via ORAL
  Filled 2012-12-05 (×3): qty 1

## 2012-12-05 MED ORDER — SERTRALINE HCL 50 MG PO TABS: 50.0000 mg | ORAL_TABLET | Freq: Every day | ORAL | Status: DC

## 2012-12-05 MED ORDER — OXYCODONE HCL 5 MG PO TABS: 5.0000 mg | ORAL_TABLET | ORAL | Status: DC | PRN

## 2012-12-05 MED ORDER — ENOXAPARIN SODIUM 40 MG/0.4ML ~~LOC~~ SOLN
40.0000 mg | SUBCUTANEOUS | Status: DC
  Administered 2012-12-05 – 2012-12-08 (×4): 40 mg via SUBCUTANEOUS
  Filled 2012-12-05 (×5): qty 0.4

## 2012-12-05 MED ORDER — PANTOPRAZOLE SODIUM 40 MG PO TBEC
40.0000 mg | DELAYED_RELEASE_TABLET | Freq: Every day | ORAL | Status: DC
  Administered 2012-12-06 – 2012-12-09 (×4): 40 mg via ORAL
  Filled 2012-12-05 (×4): qty 1

## 2012-12-05 MED ORDER — ONDANSETRON 8 MG PO TBDP
8.0000 mg | ORAL_TABLET | Freq: Three times a day (TID) | ORAL | Status: DC | PRN
  Administered 2012-12-06: 8 mg via ORAL
  Filled 2012-12-05: qty 1

## 2012-12-05 MED ORDER — TESTOSTERONE 4 MG/24HR TD PT24
1.0000 | MEDICATED_PATCH | Freq: Every day | TRANSDERMAL | Status: DC
  Administered 2012-12-06 – 2012-12-08 (×3): 1 via TRANSDERMAL
  Filled 2012-12-05 (×6): qty 1

## 2012-12-05 MED ORDER — PROCHLORPERAZINE MALEATE 10 MG PO TABS
10.0000 mg | ORAL_TABLET | Freq: Four times a day (QID) | ORAL | Status: DC | PRN
  Administered 2012-12-05 – 2012-12-07 (×2): 10 mg via ORAL
  Filled 2012-12-05 (×3): qty 1

## 2012-12-05 MED ORDER — SERTRALINE HCL 100 MG PO TABS
100.0000 mg | ORAL_TABLET | ORAL | Status: DC
  Administered 2012-12-05 – 2012-12-09 (×3): 100 mg via ORAL
  Filled 2012-12-05 (×3): qty 1

## 2012-12-05 MED ORDER — ACETAMINOPHEN 650 MG RE SUPP: 650.0000 mg | Freq: Four times a day (QID) | RECTAL | Status: DC | PRN

## 2012-12-05 MED ORDER — ACETAMINOPHEN 325 MG PO TABS: 650.0000 mg | ORAL_TABLET | Freq: Four times a day (QID) | ORAL | Status: DC | PRN

## 2012-12-05 MED ORDER — SODIUM CHLORIDE 0.9 % IV SOLN
INTRAVENOUS | Status: DC
  Administered 2012-12-05 – 2012-12-09 (×6): via INTRAVENOUS
  Filled 2012-12-05 (×13): qty 1000

## 2012-12-05 NOTE — ED Notes (Signed)
Pt c/o diarrhea and generalized weakness x 3 weeks, pt states he was discharged from hospital on Friday and thought diarrhea was getting better but has had 5 BMs today.

## 2012-12-05 NOTE — ED Provider Notes (Signed)
History     CSN: [id number]  Arrival date & time 12/05/12  1318   First MD Initiated Contact with Patient 12/05/12 1413      Chief Complaint  Patient presents with  . Diarrhea    (Consider location/radiation/quality/duration/timing/severity/associated sxs/prior treatment) HPI Plez Belton is a 66 y.o. 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 Patient was discharged in the hospital for chronic diarrhea and took to get a hold of the graft-versus-host disease he went home on Friday. Patient deteriorated at home and is unable to walk unassisted, he is unable to get up out of his bed when he has diarrhea which is just as frequent, diarrhea is severe and is not controlled with Lomotil or Imodium AD. Patient denies any fevers or chills, chest pain, shortness of breath, abdominal pain, blood in the diarrhea,   3 weeks h/o diarrhea, was hospitalized for it, no appetite, 5 stools today, was in hosptial until Friday -   Chronic GVHD  Patient's oncologist is Dr. Darnelle Catalan  CLL BMT, March 7 in [place]  Plan: [place] is clearly turning the corner; at the same time he remains severely malnourished and his counts are dropping. All his meds are now po. I will check IgG and testosterone today and tacrolimus level tomorrow. Will add prednisone 10 mg po Q AM to regimen. Likely home tomorrow--will need daily IVF and labs twice weekly, BSC, and visits weekly to clinic. Will discuss w. Dr Collier Salina re. Whether we should consider TNA temporarily   Past Medical History  Diagnosis Date  . 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 Diverticular disease   . Hyperlipidemia   . Obesity   . Hypertension   . Hiatal hernia   . CMV (cytomegalovirus) antibody positive     pre-transplant, with seroconversion x2 pst-transplant  . Right bundle branch block      pre-transplant  . CKD (chronic kidney disease) stage 2, GFR 60-89 ml/min   . Pancytopenia   . Steroid-induced diabetes   . Atrial fibrillation     post-transplant  . Myopathy   . 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 Leukemia, chronic lymphoid   . Chronic graft-versus-host disease     Past Surgical History  Procedure Date  . Tonsillectomy and adenoidectomy   . Bone marrow transplant   . Flexible sigmoidoscopy 11/17/2012    Procedure: FLEXIBLE SIGMOIDOSCOPY;  Surgeon: Petra Kuba, MD;  Location: [facility];  Service: Endoscopy;  Laterality: N/A;  Dr Ewing Schlein states will be admitted to [facility]oom 1339 11/16/12  . Esophagogastroduodenoscopy 11/17/2012    Procedure: ESOPHAGOGASTRODUODENOSCOPY (EGD);  Surgeon: Petra Kuba, MD;  Location: [facility];  Service: Endoscopy;  Laterality: N/A;    Family History  Problem Relation Age of Onset  . Cancer Father     History  Substance Use Topics  . Smoking status: Never Smoker   . Smokeless tobacco: Never Used  . Alcohol Use: No      Review of Systems At least 10pt or greater review of systems completed and are negative except where specified in the HPI.  Allergies  Benadryl  Home Medications   Current Outpatient Rx  Name  Route  Sig  Dispense  Refill  . ACYCLOVIR 400 MG PO TABS   Oral   Take 1 tablet (400 mg total) by mouth 2 (two) times  daily.   60 tablet   2   . BIOTENE DRY MOUTH MT LIQD   Mouth Rinse   15 mLs by Mouth Rinse route 2 times daily at 12 noon and 4 pm.   237 mL   6   . ASPIRIN 81 MG PO CHEW   Oral   Chew 1 tablet (81 mg total) by mouth daily.   30 tablet   6   . BUDESONIDE ER 3 MG PO CP24   Oral   Take 3 capsules (9 mg total) by mouth every morning.   90 capsule   6   . CITRACAL PO   Oral   Take 1,200 mg by mouth 2 (two) times daily.         Marland Kitchen VITAMIN D 1000 UNITS PO TABS   Oral   Take 1,000 Units by mouth every evening.          Marland Kitchen DIPHENOXYLATE-ATROPINE 2.5-0.025 MG PO  TABS      Take 1 to 2 tablets as needed for diarrhea, maximum 6 per day   60 tablet   3   . FLUCONAZOLE 100 MG PO TABS   Oral   Take 100 mg by mouth every morning.         Marland Kitchen LABETALOL HCL 200 MG PO TABS   Oral   Take 100 mg by mouth 2 (two) times daily.          Marland Kitchen LEVOFLOXACIN 500 MG PO TABS   Oral   Take 500 mg by mouth every morning.         Marland Kitchen LISINOPRIL 10 MG PO TABS   Oral   Take 5 mg by mouth at bedtime.         Marland Kitchen LOPERAMIDE HCL 2 MG PO CAPS   Oral   Take 1 capsule (2 mg total) by mouth as needed for diarrhea or loose stools (Given with each loose stool not to exceed 4 tablets daily).   30 capsule   6   . CENTRUM SILVER ADULT 50+ PO TABS   Oral   Take 1 tablet by mouth every evening.          Marland Kitchen VITAL 1.5 CAL PO LIQD   Oral   Take 237 mLs by mouth 2 (two) times daily.   60 Can   6   . FISH OIL 300 MG PO CAPS   Oral   Take 600 mg by mouth every morning.          Marland Kitchen OMEPRAZOLE 20 MG PO CPDR   Oral   Take 40 mg by mouth every morning.         Marland Kitchen ONDANSETRON 8 MG PO TBDP   Oral   Take 8 mg by mouth 2 (two) times daily as needed. For nausea         . PREDNISONE 10 MG PO TABS   Oral   Take 5 mg by mouth daily with breakfast.         . PROCHLORPERAZINE MALEATE 10 MG PO TABS   Oral   Take 10 mg by mouth every 6 (six) hours as needed. Nausea         . SULFAMETHOXAZOLE-TMP DS 800-160 MG PO TABS   Oral   Take 1 tablet by mouth 2 (two) times a week. Mondays and Tuesdays, twice daily         . TACROLIMUS 0.5 MG PO CAPS   Oral   Take 1.5 mg by  mouth 2 (two) times daily.         . TESTOSTERONE 4 MG/24HR TD PT24   Transdermal   Place 1 patch onto the skin daily.   30 patch   3   . HEPARIN LOCK FLUSH 100 UNIT/ML IV SOLN   Intravenous   Inject 100 Units into the vein daily.         Marland Kitchen LEVOFLOXACIN 750 MG PO TABS   Oral   Take 750 mg by mouth daily.         Marland Kitchen MECLIZINE HCL 25 MG PO TABS   Oral   Take 25 mg by mouth Twice  daily as needed. For dizziness         . NORMAL SALINE FLUSH 0.9 % IV SOLN   Intravenous   Inject 1 Units into the vein daily. Through chest         . SERTRALINE HCL 50 MG PO TABS   Oral   Take 50-100 mg by mouth daily. Takes 50 mg one day and 100 mg the next day           BP 113/69  Pulse 91  Temp 98.6 F (37 C) (Oral)  Resp 18  SpO2 97%  Physical Exam  Nursing notes reviewed.  Electronic medical record reviewed. VITAL SIGNS:   Filed Vitals:   12/05/12 1322  BP: 113/69  Pulse: 91  Temp: 98.6 F (37 C)  TempSrc: Oral  Resp: 18  SpO2: 97%   CONSTITUTIONAL: Awake, oriented, appears ill and malnourished HENT: Atraumatic, normocephalic, oral mucosa pink and moist, airway patent. Nares patent without drainage. External ears normal. EYES: Conjunctiva clear, EOMI, PERRLA NECK: Trachea midline, non-tender, supple CARDIOVASCULAR: Normal heart rate, Normal rhythm,no rubs, S4 PULMONARY/CHEST: Clear to auscultation, no rhonchi, wheezes, or rales. Symmetrical breath sounds. Non-tender. ABDOMINAL: Non-distended, soft, non-tender - no rebound or guarding.  BS normal. NEUROLOGIC: Non-focal, moving all four extremities, no gross sensory or motor deficits. EXTREMITIES: No clubbing, cyanosis, or edema SKIN: Warm, Dry, No erythema. Scaly mildly erythematous tonsillar plaque like rash on face  ED Course  Procedures (including critical care time)  Labs Reviewed  CBC WITH DIFFERENTIAL - Abnormal; Notable for the following:    WBC 3.1 (*)     RBC 3.68 (*)     Hemoglobin 11.1 (*)     HCT 30.8 (*)     RDW 15.7 (*)     Platelets 112 (*)     Monocytes Relative 21 (*)     All other components within normal limits  COMPREHENSIVE METABOLIC PANEL - Abnormal; Notable for the following:    Sodium 129 (*)     Calcium 7.3 (*)     Total Protein 4.2 (*)     Albumin 1.3 (*)     GFR calc non Af Amer 88 (*)     All other components within normal limits  MAGNESIUM   No results  found.   1. Diarrhea   2. Dehydration   3. Transplant recipient   4. Chronic graft-versus-host disease   5. Malnutrition       MDM  Dheeraj Hail is a 66 y.o. male presenting with similar symptoms to when he was hospitalized with before, severe chronic diarrhea and inability to tolerate foods because they pass urine so rapidly. Patient's also questioning whether not his medication is actually being absorbed. Lomotil and Imodium right ear have not slowed his diarrhea down.   Patient's hematology counts actually look improved, sodium is a  bit low consistent with diarrhea as well as slightly low calcium at 7.3, albumin is 1.3 suggesting malnutrition.  Patient has a failure to thrive clinical impression, as well as failure of outpatient therapy.  Patient receiving some fluids now in the emergency department, vital signs are stable.  Discussed with Eunice Blase, she contacted Dr. Cyndie Chime was in the hospital this time and will stop and see the patient-admit the patient for further care.         Jones Skene, MD 12/06/12 [phone]

## 2012-12-05 NOTE — H&P (Signed)
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 initially diagnosed with chronic lymphocytic leukemia/well-differentiated lymphocytic lymphoma back in August of 2000. Initial early-stage not requiring any treatment until progressive elevation of his white count and development of constitutional symptoms in December of 2006. Initial chemotherapy with fludarabine, Cytoxan, Rituxan for 5 cycles between December 06 and May 07. Subsequent progression and treatment with sequential Rituxan,then will Leustatin plus ofatumumab, through September 2012. He subsequently underwent a matched unrelated donor bone marrow transplant at the [facility] [place] in [place] 02/24/2012. Conditioning regimen pretransplant with fludarabine plus total body radiation 200 cGy followed by Rituxan. Post transplant complicated by a reactivation CMV status without clinical infection treated with ganciclovir, acute renal failure requiring temporary dialysis, and transient atrial fibrillation previously treated with amiodarone and hypomagnesemia felt related to the ganciclovir. He is hypogammaglobulinemic and has been receiving intermittent monthly intravenous immunoglobulin.  Marland KitchenHe has developed chronic graft-versus-host disease affecting the skin and GI tract. He has been on immunosuppressive drugs including steroids, tacrolimus, and MMF . He has had recurrent bouts of severe diarrhea. He was just admitted here on November 26. He underwent upper endoscopy and colonoscopy. He had active gastritis and mild distal esophagitis. Random biopsies from both the colon and the stomach were consistent with grade 2 graft-versus-host disease. His immunosuppressive drugs were adjusted. He was sent home on prednisone 10 mg  daily and tacrolimus 1.5 mg twice a day.  His diarrhea subsided down to about 2 bowel movements daily until last night at 2 in the morning when he again began to have profuse diarrhea which has persisted all day and has been refractory to both Imodium and Lomotil. In view of his immunocompromised status and likely flareup of graft-versus-host disease, he is admitted at this time for parenteral fluids, antidiarrheals, and supervision of his immunosuppressive drugs.  Of interest is the fact that his father died of complications of chronic lymphocytic leukemia. Both he and his dad were studied for possible genetic link to their common disease. He has a brother age 51 and a daughter age 70 who are healthy.   Past Medical History  Diagnosis Date  . 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 Diverticular disease   . Hyperlipidemia   . Obesity   . Hypertension   . Hiatal hernia   . CMV (cytomegalovirus) antibody positive     pre-transplant, with seroconversion x2 pst-transplant  . Right bundle branch block     pre-transplant  . CKD (chronic kidney disease) stage 2, GFR 60-89 ml/min   . Pancytopenia   . Steroid-induced diabetes   . Atrial fibrillation     post-transplant  . Myopathy   . 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 Leukemia, chronic lymphoid   . 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 Diarrhea in adult patient 12/05/2012    Due to active GVHD  . CLL (chronic lymphocytic leukemia) 12/05/2012    Dx 07/1999; started Rx 12/06  AlloBMT 3/13  . Rash of face 12/05/2012    Due to GVHD   Current Facility-Administered Medications on File Prior to Encounter  Medication Dose Route Frequency Provider Last Rate Last Dose  . sodium chloride 0.9 % injection 10 mL  10 mL Intravenous PRN Timothy Dell, MD   10 mL at 08/11/12 1606   Current Outpatient Prescriptions on File Prior to  Encounter  Medication Sig Dispense Refill  . acyclovir (ZOVIRAX) 400 MG tablet Take 1 tablet (400 mg total) by mouth 2 (two) times daily.  60 tablet  2  . antiseptic oral rinse (BIOTENE) LIQD 15 mLs by Mouth Rinse route 2 times daily at 12 noon and 4 pm.  237 mL  6  . aspirin 81 MG chewable tablet Chew 1 tablet (81 mg total) by mouth daily.  30 tablet  6  . budesonide (ENTOCORT EC) 3 MG 24 hr capsule Take 3 capsules (9 mg total) by mouth every morning.  90 capsule  6  . Calcium Citrate (CITRACAL PO) Take 1,200 mg by mouth 2 (two) times daily.      . cholecalciferol (VITAMIN D) 1000 UNITS tablet Take 1,000 Units by mouth every evening.       . diphenoxylate-atropine (LOMOTIL) 2.5-0.025 MG per tablet Take 1 to 2 tablets as needed for diarrhea, maximum 6 per day  60 tablet  3  . labetalol (NORMODYNE) 200 MG tablet Take 100 mg by mouth 2 (two) times daily.       Marland Kitchen loperamide (IMODIUM) 2 MG capsule Take 1 capsule (2 mg total) by mouth as needed for diarrhea or loose stools (Given with each loose stool not to exceed 4 tablets daily).  30 capsule  6  . Multiple Vitamins-Minerals (CENTRUM SILVER ADULT 50+) TABS Take 1 tablet by mouth every evening.       . Nutritional Supplements (FEEDING SUPPLEMENT, VITAL 1.5 CAL,) LIQD Take 237 mLs by mouth 2 (two) times daily.  60 Can  6  . Omega-3 Fatty Acids (FISH OIL) 300 MG CAPS Take 600 mg by mouth every morning.       . prochlorperazine (COMPAZINE) 10 MG tablet Take 10 mg by mouth every 6 (six) hours as needed. Nausea      . sertraline (ZOLOFT) 50 MG tablet Take 50-100 mg by mouth daily. Takes 50 mg one day and 100 mg the next day      . testosterone (ANDRODERM) 4 MG/24HR PT24 patch Place 1 patch onto the skin daily.  30 patch  3   Allergies  Allergen Reactions  . Benadryl (Diphenhydramine Hcl)     "Restless leg syndrome"   Family History  Problem Relation Age of Onset  . Cancer Father    History   Social History  . Marital Status: Married    Spouse  Name: N/A    Number of Children: 1  . Years of Education: N/A   Occupational History  . PROFESSOR [employer]   Social History Main Topics  . Smoking status: Never Smoker   . Smokeless tobacco: Never Used  . Alcohol Use: No  . Drug Use: No  . Sexually Active:    Other Topics Concern  . 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 Patient currently to his part-time at [employer]. The patient is married to Los Ybanez, and has been married for 40 years. 1 daughter [name] who lives in [place].    ROS: Eyes: no double vision or blurred vision but he believes one of his eyes is weaker than the other he has had from some trouble focusing. Throat: Mild scratchy throat Neck: No stiff neck Resp:  No cough or dyspnea Cardio: No  chest pain or palpitations  GI: no nausea or vomiting, no abdominal pain or cramps. Diarrhea as noted above Extremities: Increasing leg edema over the last 2-3 weeks Lymph nodes: No swollen glands Neurologic:  No headache or change in vision; previous history of steroid myopathy Skin: . Frequent ecchymoses. Generalized rash due to GVHD has improved but persistent rash on his face Genitourinary: He denies urinary urgency, frequency, or hematuria   Vitals: Filed Vitals:   12/05/12 1322  BP: 113/69  Pulse: 91  Temp: 98.6 F (37 C)  Resp: 18   Wt Readings from Last 3 Encounters:  12/03/12 190 lb 4.1 oz (86.3 kg)  12/03/12 190 lb 4.1 oz (86.3 kg)  11/10/12 179 lb 11.2 oz (81.511 kg)     PHYSICAL EXAM: General appearance: Chronically ill-appearing Caucasian man Head: Normal normal Eyes: Normal Throat: No erythema or exudate Neck: Full range of motion Lymph Nodes: No adenopathy Resp: Decreased breath sounds one half the way up over the right hemithorax but resonant to percussion Breasts:  Cardio: Regular rhythm no murmur GI: Abdomen is soft, nontender, no mass, no  organomegaly Extremities: 2+ edema to the knees, no calf tenderness Vascular: Vascular catheter right subclavian position Neurologic: Mental status intact, cranial nerves grossly normal, PERRLA, motor strength 5 over 5, reflexes 1+ symmetric Skin: Skin is pale, thin, multiple ecchymoses most prominent dorsum left hand, erythematous patchy rash on his entire face from known GVHD   Labs:   [facility] 12/05/12 1507 12/03/12 0615  WBC 3.1* 2.7*  HGB 11.1* 10.0*  HCT 30.8* 28.3*  PLT 112* 88*    Basename 12/05/12 1507 12/03/12 0615  NA 129* 131*  K 3.6 3.6  CL 102 102  CO2 20 20  GLUCOSE 82 90  BUN 16 19  CREATININE 0.87 0.94  CALCIUM 7.3* 7.1*    Images Studies/Results:  No results found.   Problem List: Principal Problem:  *Chronic GVHD complicating bone marrow transplantation Active Problems:  Diarrhea in adult patient  CLL (chronic lymphocytic leukemia)  Rash of face   Assessment/Plan: Hydration and electrolyte  replacement I'm going to give him a short course of parenteral Solu-Medrol 40 mg every 12 hours. Continue his tacrolimus at current dose 1.5 mg twice daily and check a level. A profuse diarrhea persists, we may need to give the tacrolimus intravenously. This had to be done during the recent admission in November. He had recent endoscopy studies and I don't think that these need to be repeated. We establish that his diarrhea is likely due to graft versus host disease and not infectious. Continue his prophylactic antivirals, antifungals, and PCP prophylaxis. Currently not neutropenic so I will hold his Levaquin    Timothy [name],Timothy [name] M 12/05/2012, 6:10 PM

## 2012-12-06 DIAGNOSIS — D89813 Graft-versus-host disease, unspecified: Secondary | ICD-10-CM

## 2012-12-06 DIAGNOSIS — T8600 Unspecified complication of bone marrow transplant: Principal | ICD-10-CM

## 2012-12-06 DIAGNOSIS — D709 Neutropenia, unspecified: Secondary | ICD-10-CM

## 2012-12-06 LAB — CBC WITH DIFFERENTIAL/PLATELET
Basophils Relative: 0 % (ref 0–1)
Eosinophils Relative: 0 % (ref 0–5)
Hemoglobin: 10 g/dL — ABNORMAL LOW (ref 13.0–17.0)
Lymphs Abs: 0.6 10*3/uL — ABNORMAL LOW (ref 0.7–4.0)
MCH: 29.7 pg (ref 26.0–34.0)
MCV: 84.6 fL (ref 78.0–100.0)
Monocytes Absolute: 0.1 10*3/uL (ref 0.1–1.0)
Monocytes Relative: 5 % (ref 3–12)
Platelets: 97 10*3/uL — ABNORMAL LOW (ref 150–400)
RBC: 3.37 MIL/uL — ABNORMAL LOW (ref 4.22–5.81)
WBC: 1.6 10*3/uL — ABNORMAL LOW (ref 4.0–10.5)

## 2012-12-06 LAB — COMPREHENSIVE METABOLIC PANEL
AST: 16 U/L (ref 0–37)
Albumin: 1.2 g/dL — ABNORMAL LOW (ref 3.5–5.2)
BUN: 17 mg/dL (ref 6–23)
Calcium: 7.1 mg/dL — ABNORMAL LOW (ref 8.4–10.5)
Creatinine, Ser: 0.87 mg/dL (ref 0.50–1.35)
Total Bilirubin: 0.3 mg/dL (ref 0.3–1.2)
Total Protein: 3.8 g/dL — ABNORMAL LOW (ref 6.0–8.3)

## 2012-12-06 LAB — MAGNESIUM: Magnesium: 1.5 mg/dL (ref 1.5–2.5)

## 2012-12-06 MED ORDER — VITAL 1.5 CAL PO LIQD
237.0000 mL | Freq: Two times a day (BID) | ORAL | Status: DC
  Administered 2012-12-06 – 2012-12-09 (×5): 237 mL via ORAL
  Filled 2012-12-06 (×8): qty 237

## 2012-12-06 MED ORDER — ENSURE PUDDING PO PUDG
1.0000 | Freq: Three times a day (TID) | ORAL | Status: DC
  Administered 2012-12-06 – 2012-12-07 (×2): 1 via ORAL
  Filled 2012-12-06 (×4): qty 1

## 2012-12-06 MED ORDER — BUDESONIDE 3 MG PO CP24
9.0000 mg | ORAL_CAPSULE | Freq: Every day | ORAL | Status: DC
  Administered 2012-12-06 – 2012-12-09 (×4): 9 mg via ORAL
  Filled 2012-12-06 (×4): qty 3

## 2012-12-06 MED ORDER — LEVOFLOXACIN 500 MG PO TABS
500.0000 mg | ORAL_TABLET | Freq: Every day | ORAL | Status: DC
  Administered 2012-12-06 – 2012-12-09 (×4): 500 mg via ORAL
  Filled 2012-12-06 (×5): qty 1

## 2012-12-06 MED ORDER — FILGRASTIM 480 MCG/1.6ML IJ SOLN
480.0000 ug | Freq: Every day | INTRAMUSCULAR | Status: DC
  Administered 2012-12-06: 480 ug via SUBCUTANEOUS
  Filled 2012-12-06 (×2): qty 1.6

## 2012-12-06 NOTE — Progress Notes (Signed)
Chaplain visited with patient and his wife at bedside...both patient and his wife expressed appreciation for the visit. Visit cut short when nurse came to work with patient.  Will follow up Thursday if patient is still here.

## 2012-12-06 NOTE — Progress Notes (Signed)
Timothy [name]   DOB:[birth year]   ZO#:[id number]   JWJ#:[id number]  Subjective: Dennard went home Friday 12/03/2012 and found he could not climb the steps to the 2d floor. They set him up in the sofa downstairs. However, he continued to have diarrhea, some of which is more formed, and stool incontinence. On Sunday 12/05/2012 he had multiple "squirt" BMs and they felt it would not be possible for him to stay at home--his wife was overwhelmed (not just from the incontinence but the 2 AM BMs and the need for 24 hr care). He will need intensive Physical Therapy, likely for several weeks, before he can get home. This was discussed and he agrees; wife not in room  Objective: middle aged white man examined in bed  Filed Vitals:   12/06/12 0427  BP: 126/72  Pulse: 83  Temp: 98.4 F (36.9 C)  Resp: 24    Body mass index is 28.16 kg/(m^2).   Face clear; no significant facial or eyelid irritation  Sclerae unicteric; eyelids minimally irritated  Oropharynx entirely clear  No peripheral adenopathy  Lungs no rales or rhonchi, good excursion bilaterally  Heart regular rate and rhythm, no murmur appreciated  Abdomen soft, NT, +BS  MSK 2+ bilateral ankle edema; joints flexible;   Neuro nonfocal, A&O x3, positive affect  Skin: rash resolved; normal skin turgor  Labs:  Tacrolimus level pending  Lab Results  Component Value Date   WBC 1.6* 12/06/2012   HGB 10.0* 12/06/2012   HCT 28.5* 12/06/2012   MCV 84.6 12/06/2012   PLT 97* 12/06/2012   NEUTROABS 0.9* 12/06/2012    Urine Studies No results found for this basename: UACOL:2,UAPR:2,USPG:2,UPH:2,UTP:2,UGL:2,UKET:2,UBIL:2,UHGB:2,UNIT:2,UROB:2,ULEU:2,UEPI:2,UWBC:2,URBC:2,UBAC:2,CAST:2,CRYS:2,UCOM:2,BILUA:2 in the last 72 hours  Basic Metabolic Panel:  Lab 12/06/12 6213 12/05/12 1507 12/03/12 0615 12/02/12 0540 12/01/12 0535  NA 126* 129* 131* 132* 131*  K 4.2 3.6 -- -- --  CL 100 102 102 104 102  CO2 18* 20 20 20 21   GLUCOSE 99 82 90 84 85  BUN  17 16 19 18 15   CREATININE 0.87 0.87 0.94 0.89 0.87  CALCIUM 7.1* 7.3* 7.1* 7.1* 7.3*  MG 1.5 1.6 1.6 1.6 1.3*  PHOS -- -- -- -- --   GFR Estimated Creatinine Clearance: 88.1 ml/min (by C-G formula based on Cr of 0.87). Liver Function Tests:  Lab 12/06/12 0635 12/05/12 1507 12/03/12 0615 12/02/12 0540 12/01/12 0535  AST 16 16 15 19 24   ALT 14 16 19 23 30   ALKPHOS 52 56 49 47 55  BILITOT 0.3 0.3 0.2* 0.3 0.4  PROT 3.8* 4.2* 2.8* 2.8* 3.2*  ALBUMIN 1.2* 1.3* 1.3* 1.4* 1.6*   No results found for this basename: LIPASE:5,AMYLASE:5 in the last 168 hours No results found for this basename: AMMONIA:5 in the last 168 hours Coagulation profile No results found for this basename: INR:5,PROTIME:5 in the last 168 hours  CBC:  Lab 12/06/12 0635 12/05/12 1507 12/03/12 0615 12/02/12 0540 12/01/12 0535  WBC 1.6* 3.1* 2.7* 2.5* 4.2  NEUTROABS 0.9* 1.8 0.9* 1.5* 2.5  HGB 10.0* 11.1* 10.0* 10.4* 12.4*  HCT 28.5* 30.8* 28.3* 28.7* 34.5*  MCV 84.6 83.7 84.5 84.4 84.6  PLT 97* 112* 88* 71* 95*   Cardiac Enzymes: No results found for this basename: CKTOTAL:5,CKMB:5,CKMBINDEX:5,TROPONINI:5 in the last 168 hours BNP: No components found with this basename: POCBNP:5 CBG: No results found for this basename: GLUCAP:5 in the last 168 hours D-Dimer No results found for this basename: DDIMER:2 in the last 72 hours Hgb A1c No results  found for this basename: HGBA1C:2 in the last 72 hours Lipid Profile No results found for this basename: CHOL:2,HDL:2,LDLCALC:2,TRIG:2,CHOLHDL:2,LDLDIRECT:2 in the last 72 hours Thyroid function studies No results found for this basename: TSH,T4TOTAL,FREET3,T3FREE,THYROIDAB in the last 72 hours Anemia work up No results found for this basename: VITAMINB12:2,FOLATE:2,FERRITIN:2,TIBC:2,IRON:2,RETICCTPCT:2 in the last 72 hours Microbiology Recent Results (from the past 240 hour(s))  CLOSTRIDIUM DIFFICILE BY PCR     Status: Normal   Collection Time   11/26/12 11:25 AM       Component Value Range Status Comment   C difficile by pcr NEGATIVE  NEGATIVE Final    Rotovirus negative   Studies:  X-ray Chest Pa And Lateral   12/05/2012  *RADIOLOGY REPORT*  Clinical Data: Decreased breath sounds right lung.  Bone marrow transplant patient.  History of CLL.  CHEST - 2 VIEW  Comparison: 11/16/2012  Findings: Right IJ central venous catheter is unchanged.  Lungs are adequately inflated with mild opacification of the left base compatible with a worsening small left effusion.  Cannot exclude associated atelectasis or infection in the left base. Cardiomediastinal silhouette is unchanged.  There is mild loss of height of a lower thoracic vertebrae without significant change.  IMPRESSION: Worsening opacification in the left base compatible a small left effusion as cannot exclude associated atelectasis / infection in the left base.  Right-sided central venous catheter unchanged.  Stable mild compression deformity of the mid to lower thoracic spine.   Original Report Authenticated By: Elberta Fortis, M.D.     Assessment: 66 y.o. [place] man with a history of well-differentiated lymphocytic lymphoma/ chronic lymphoid leukemia initially diagnosed in 2000, not requiring intervention until 2006; with multiple chromosomal abnormalities; s/p allogeneic transplant March 2013 at "[facility]", now admitted with GVHD.  His treatment history is as follows:   (1) fludarabine/cyclophosphamide/rituximab x5 completed May 2007.  (2) rituximab for 8 doses October 2010, with partial response  (3) Leustatin and ofatumumab weekly x8 July to September 2011 followed by maintenance ofatumumab maintenance ofatumumab every 2 months, with initial response but rising counts September 2012  (4) status-post unrelated donor stem-cell transplant 02/24/2012 at the [facility]  (a) conditioning regimen consisted of fludarabine + TBI at 200 cGy, followed by rituximab x27;  (b) CMV reactivation x3 (patient CMV  positive, donor negative), s/p ganciclovir treatment; 3d reactivation August 2013, s/p gancyclovir, with negative PCR mid-September 2013; last gancyclovir dose 10/06/2012  (c) GVHD: involving gut and skin, treated with steroids, tacrolimus and MMF  (d) atrial fibrillation: resolved on amiodarone  (e) steroid-induced myopathy: improving until current crisis (f) hypomagnesemia: resolved with discontinuation of gancyclovir (g) hypogammaglobulinemia: s/p IVIG most recently 10/01/2012; IgG 617 on11/20/2013 (h) elevated triglycerides (606 on 07/14/2012)  (i) adrenal insufficiency: on prednisone  (j) pancytopenia; off growth factors (5) restaging studies September 2013 including CT scans, flow cytometry, and bone marrow biopsy, showed no evidence of residual chronic lymphoid leukemia.  (6) now with a skin rash, mouth changes, severe diarrhea and gastric/duodenal/colonic biopsies 11/17/2012 c/w GVHD grade 2 (7) severe malnutrition (8) pancytopenia  Plan: Pierson' s graft-versus-host disease is still active, and for some reason it worsened at home. It seems to now be getting back to where it was before his discharged 12/03/2012. However she is becoming neutropenic again, and his platelets are under 100,000. I do not think it going to be able for him to return home on a permanent basis until he is more ambulatory. I am hopeful adding the testosterone patch and giving the steroids as budesonide  will help in this regard. However he will need to go to a rehabilitation facility for I would anticipate several weeks. This was discussed with him today and with a Child psychotherapist. Anticipated date of discharge will be a week from now. In the meantime I will repeat a tacrolimus level, rule out C. difficile, and try to taper down his steroids to the level at the time of discharge. We will also add back for Neupogen.  Geniva Lohnes C 12/06/2012

## 2012-12-06 NOTE — Progress Notes (Signed)
INITIAL NUTRITION ASSESSMENT  Pt meets criteria for severe MALNUTRITION in the context of chronic illness as evidenced by <75% estimated energy intake in the past 2 months with 32.7% weight loss in the past 10 months.  DOCUMENTATION CODES Per approved criteria  -Severe malnutrition in the context of chronic illness   INTERVENTION: - Ensure pudding TID - Vital 1.5, 1 can orally BID - Encouraged continued excellent intake - Will monitor   NUTRITION DIAGNOSIS:  Altered GI function related to diarrhea as evidenced by pt statement.   Goal: 1. Resolution of diarrhea 2. Pt to consume >90% of meals/supplements  Monitor:  Weights, labs, intake, diarrhea  Reason for Assessment: Nutrition risk   66 y.o. male  Admitting Dx: Chronic GVHD complicating bone marrow transplantation  ASSESSMENT: Pt known to RD from previous admission last month. Pt with chronic lymphoid leukemia s/p allogenic transplant and various chemotherapy treatments. Pt's weight down 90 pounds since February of this year, probably more as pt with moderate pitting RLE and LLE edema. Pt had poor appetite prior to last admission however was eating 2-3 meals/day and was eating at least 50% of meals prior to discharge on 12/03/12. Pt reports eating minimally since discharge on Friday. He reports having 3 loose, "pudding-like" stools on Saturday, 6 on Sunday, and 2 so far today. Pt reports eating better today than during previous admission, 65% of lunch. Pt was started on Ensure pudding and cans of Vital 1.5 during previous admission which pt enjoyed and requested for this admission - will order.    Height: Ht Readings from Last 1 Encounters:  12/05/12 5\' 8"  (1.727 m)    Weight: Wt Readings from Last 1 Encounters:  12/05/12 185 lb 3 oz (84 kg)    Ideal Body Weight: 154 lb  % Ideal Body Weight: 120    Usual Body Weight: 275 lb in February 2013 per pt report  % Usual Body Weight: 67  BMI:  Body mass index is 28.16  kg/(m^2).  Estimated Nutritional Needs: Kcal: 1750-2100  Protein: 85-105g  Fluid: 1.7-2.1L  Skin: Moderate pitting RLE and LLE edema  Diet Order: General  EDUCATION NEEDS: -No education needs identified at this time   Intake/Output Summary (Last 24 hours) at 12/06/12 1647 Last data filed at 12/06/12 1300  Gross per 24 hour  Intake    720 ml  Output    151 ml  Net    569 ml    Last BM: 12/16 per pt   Labs:   Lab 12/06/12 0635 12/05/12 1507 12/03/12 0615  NA 126* 129* 131*  K 4.2 3.6 3.6  CL 100 102 102  CO2 18* 20 20  BUN 17 16 19   CREATININE 0.87 0.87 0.94  CALCIUM 7.1* 7.3* 7.1*  MG 1.5 1.6 1.6  PHOS -- -- --  GLUCOSE 99 82 90    CBG (last 3)  No results found for this basename: GLUCAP:3 in the last 72 hours  Scheduled Meds:   . acyclovir  400 mg Oral BID  . antiseptic oral rinse  15 mL Mouth Rinse q12n4p  . budesonide  9 mg Oral Daily  . enoxaparin (LOVENOX) injection  40 mg Subcutaneous Q24H  . feeding supplement  1 Container Oral TID BM  . feeding supplement (VITAL 1.5 CAL)  237 mL Oral BID  . filgrastim (NEUPOGEN)  SQ  480 mcg Subcutaneous q1800  . fluconazole  100 mg Oral q morning - 10a  . levofloxacin  500 mg Oral Daily  .  methylPREDNISolone (SOLU-MEDROL) injection  40 mg Intravenous Q12H  . pantoprazole  40 mg Oral Daily  . sertraline  100 mg Oral QODAY  . sertraline  50 mg Oral QODAY  . sulfamethoxazole-trimethoprim  1 tablet Oral 2 times weekly  . tacrolimus  1.5 mg Oral BID  . testosterone  1 patch Transdermal Daily    Continuous Infusions:   . 0.9 % sodium chloride with kcl 100 mL/hr at 12/06/12 9147    Past Medical History  Diagnosis Date  . 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 Diverticular disease   . Hyperlipidemia   . Obesity   . Hypertension   . Hiatal hernia   . CMV (cytomegalovirus) antibody positive     pre-transplant, with seroconversion x2 pst-transplant  . Right bundle branch  block     pre-transplant  . CKD (chronic kidney disease) stage 2, GFR 60-89 ml/min   . Pancytopenia   . Steroid-induced diabetes   . Atrial fibrillation     post-transplant  . Myopathy   . 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 Leukemia, chronic lymphoid   . 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 Diarrhea in adult patient 12/05/2012    Due to active GVHD  . CLL (chronic lymphocytic leukemia) 12/05/2012    Dx 07/1999; started Rx 12/06  AlloBMT 3/13  . Rash of face 12/05/2012    Due to GVHD    Past Surgical History  Procedure Date  . Tonsillectomy and adenoidectomy   . Bone marrow transplant   . Flexible sigmoidoscopy 11/17/2012    Procedure: FLEXIBLE SIGMOIDOSCOPY;  Surgeon: Petra Kuba, MD;  Location: [facility];  Service: Endoscopy;  Laterality: N/A;  Dr Ewing Schlein states will be admitted to rooom [id number] 11/16/12  . Esophagogastroduodenoscopy 11/17/2012    Procedure: ESOPHAGOGASTRODUODENOSCOPY (EGD);  Surgeon: Petra Kuba, MD;  Location: [facility];  Service: Endoscopy;  Laterality: N/A;      Levon Hedger MS, RD, LDN [phone] Pager [phone] After Hours Pager

## 2012-12-07 ENCOUNTER — Encounter: Payer: Self-pay | Admitting: *Deleted

## 2012-12-07 LAB — COMPREHENSIVE METABOLIC PANEL
AST: 15 U/L (ref 0–37)
Albumin: 1.3 g/dL — ABNORMAL LOW (ref 3.5–5.2)
Chloride: 103 mEq/L (ref 96–112)
Creatinine, Ser: 1.01 mg/dL (ref 0.50–1.35)
Total Bilirubin: 0.3 mg/dL (ref 0.3–1.2)
Total Protein: 3.7 g/dL — ABNORMAL LOW (ref 6.0–8.3)

## 2012-12-07 LAB — CBC WITH DIFFERENTIAL/PLATELET
Basophils Absolute: 0 10*3/uL (ref 0.0–0.1)
Basophils Relative: 0 % (ref 0–1)
Eosinophils Absolute: 0 10*3/uL (ref 0.0–0.7)
MCH: 30.1 pg (ref 26.0–34.0)
MCHC: 35.8 g/dL (ref 30.0–36.0)
Monocytes Absolute: 0.6 10*3/uL (ref 0.1–1.0)
Neutro Abs: 9 10*3/uL — ABNORMAL HIGH (ref 1.7–7.7)
Neutrophils Relative %: 86 % — ABNORMAL HIGH (ref 43–77)
RDW: 16 % — ABNORMAL HIGH (ref 11.5–15.5)

## 2012-12-07 LAB — PHOSPHORUS: Phosphorus: 2.7 mg/dL (ref 2.3–4.6)

## 2012-12-07 LAB — MAGNESIUM: Magnesium: 1.6 mg/dL (ref 1.5–2.5)

## 2012-12-07 LAB — TACROLIMUS, BLOOD: Tacrolimus Lvl: 45.5 ng/mL — ABNORMAL HIGH (ref 5.0–20.0)

## 2012-12-07 MED ORDER — VITAL 1.0 CAL PO LIQD: 237.0000 mL | Freq: Three times a day (TID) | ORAL | Status: DC

## 2012-12-07 MED ORDER — PREDNISONE 10 MG PO TABS
10.0000 mg | ORAL_TABLET | Freq: Every day | ORAL | Status: DC
  Administered 2012-12-08 – 2012-12-09 (×2): 10 mg via ORAL
  Filled 2012-12-07 (×4): qty 1

## 2012-12-07 MED ORDER — LOPERAMIDE HCL 2 MG PO CAPS
2.0000 mg | ORAL_CAPSULE | Freq: Two times a day (BID) | ORAL | Status: DC | PRN
  Filled 2012-12-07 (×2): qty 1

## 2012-12-07 NOTE — Consult Note (Signed)
Physical Medicine and Rehabilitation Consult Reason for Consult: deconditioning due to  [facility] involving GI tract and skin Referring Physician: Dr. Darnelle Catalan   HPI: Timothy [name] is a 66 y.o. male with CLL  s/p allogenic stem cell transplant, reactivation of CMV who has had  ongoing problems with diarrhea and weight loss. Recent work up consistent with graft v/s host disease as well as active gastritis and mild distal esophagitis. He was readmitted on 12/05/12 with recurrent diarrhea. He treated with hydration and short course of solu-medrol. OT evaluation done today and CIR recommended due to deconditioning.    Review of Systems  Gastrointestinal: Positive for diarrhea.  Musculoskeletal: Positive for back pain.   Past Medical History  Diagnosis Date  . 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 Diverticular disease   . Hyperlipidemia   . Obesity   . Hypertension   . Hiatal hernia   . CMV (cytomegalovirus) antibody positive     pre-transplant, with seroconversion x2 pst-transplant  . Right bundle branch block     pre-transplant  . CKD (chronic kidney disease) stage 2, GFR 60-89 ml/min   . Pancytopenia   . Steroid-induced diabetes   . Atrial fibrillation     post-transplant  . Myopathy   . 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 Leukemia, chronic lymphoid   . 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 Diarrhea in adult patient 12/05/2012    Due to active GVHD  . CLL (chronic lymphocytic leukemia) 12/05/2012    Dx 07/1999; started Rx 12/06  AlloBMT 3/13  . Rash of face 12/05/2012    Due to GVHD   Past Surgical History  Procedure Date  . Tonsillectomy and adenoidectomy   . Bone marrow transplant   . Flexible sigmoidoscopy 11/17/2012    Procedure: FLEXIBLE SIGMOIDOSCOPY;  Surgeon: Petra Kuba, MD;  Location: [facility] ENDOSCOPY;  Service: Endoscopy;  Laterality: N/A;  Dr Ewing Schlein states  will be admitted to rooom 1339 11/16/12  . Esophagogastroduodenoscopy 11/17/2012    Procedure: ESOPHAGOGASTRODUODENOSCOPY (EGD);  Surgeon: Petra Kuba, MD;  Location: [facility];  Service: Endoscopy;  Laterality: N/A;   Family History  Problem Relation Age of Onset  . Cancer Father    Social History:  reports that he has never smoked. He has never used smokeless tobacco. He reports that he does not drink alcohol or use illicit drugs.   Allergies  Allergen Reactions  . Benadryl (Diphenhydramine Hcl)     "Restless leg syndrome"   Medications Prior to Admission  Medication Sig Dispense Refill  . acyclovir (ZOVIRAX) 400 MG tablet Take 1 tablet (400 mg total) by mouth 2 (two) times daily.  60 tablet  2  . antiseptic oral rinse (BIOTENE) LIQD 15 mLs by Mouth Rinse route 2 times daily at 12 noon and 4 pm.  237 mL  6  . aspirin 81 MG chewable tablet Chew 1 tablet (81 mg total) by mouth daily.  30 tablet  6  . budesonide (ENTOCORT EC) 3 MG 24 hr capsule Take 3 capsules (9 mg total) by mouth every morning.  90 capsule  6  . Calcium Citrate (CITRACAL PO) Take 1,200 mg by mouth 2 (two) times daily.      . cholecalciferol (VITAMIN D) 1000 UNITS tablet Take 1,000 Units by mouth every evening.       . diphenoxylate-atropine (LOMOTIL) 2.5-0.025 MG per tablet Take 1 to 2  tablets as needed for diarrhea, maximum 6 per day  60 tablet  3  . fluconazole (DIFLUCAN) 100 MG tablet Take 100 mg by mouth every morning.      . Heparin Lock Flush (HEPARIN FLUSH, PORCINE,) 100 UNIT/ML injection Inject 3 mLs into the vein every evening. Through chest      . labetalol (NORMODYNE) 200 MG tablet Take 100 mg by mouth 2 (two) times daily.       Marland Kitchen lisinopril (PRINIVIL,ZESTRIL) 10 MG tablet Take 5 mg by mouth at bedtime.      Marland Kitchen loperamide (IMODIUM) 2 MG capsule Take 1 capsule (2 mg total) by mouth as needed for diarrhea or loose stools (Given with each loose stool not to exceed 4 tablets daily).  30 capsule  6  .  meclizine (ANTIVERT) 25 MG tablet Take 25 mg by mouth Twice daily as needed. For dizziness      . Multiple Vitamins-Minerals (CENTRUM SILVER ADULT 50+) TABS Take 1 tablet by mouth every evening.       Marland Kitchen NORMAL SALINE FLUSH 0.9 % injection Inject 7 mLs into the vein every evening. Through chest      . Nutritional Supplements (FEEDING SUPPLEMENT, VITAL 1.5 CAL,) LIQD Take 237 mLs by mouth 2 (two) times daily.  60 Can  6  . Omega-3 Fatty Acids (FISH OIL) 300 MG CAPS Take 600 mg by mouth every morning.       Marland Kitchen omeprazole (PRILOSEC) 20 MG capsule Take 40 mg by mouth every morning.      . ondansetron (ZOFRAN-ODT) 8 MG disintegrating tablet Take 8 mg by mouth 2 (two) times daily as needed. For nausea      . predniSONE (DELTASONE) 10 MG tablet Take 5 mg by mouth daily with breakfast.      . prochlorperazine (COMPAZINE) 10 MG tablet Take 10 mg by mouth every 6 (six) hours as needed. Nausea      . sertraline (ZOLOFT) 50 MG tablet Take 50-100 mg by mouth daily. Takes 50 mg one day and 100 mg the next day      . sulfamethoxazole-trimethoprim (BACTRIM DS) 800-160 MG per tablet Take 1 tablet by mouth 2 (two) times a week. Mondays and Tuesdays, twice daily      . tacrolimus (PROGRAF) 0.5 MG capsule Take 1.5 mg by mouth 2 (two) times daily.      Marland Kitchen testosterone (ANDRODERM) 4 MG/24HR PT24 patch Place 1 patch onto the skin daily.  30 patch  3    Home: Home Living Lives With: Spouse Available Help at Discharge: Family Type of Home: House Home Access: Stairs to enter Secretary/administrator of Steps: 2 Entrance Stairs-Rails: Right Home Layout: Two level;Bed/bath upstairs Alternate Level Stairs-Number of Steps: 10 Alternate Level Stairs-Rails: Right;Left Bathroom Shower/Tub: Health visitor: Standard Home Adaptive Equipment: Paediatric nurse with back  Functional History: Prior Function Able to Take Stairs?: Yes Driving: Yes Vocation: Full time employment Comments: Administrator, arts Functional  Status:  Mobility: Bed Mobility Bed Mobility: Supine to Sit Supine to Sit: 4: Min assist;With rails;HOB elevated Sit to Supine: 4: Min assist Transfers Sit to Stand: 4: Min guard;From bed;From chair/3-in-1 Stand to Sit: 4: Min guard Ambulation/Gait Ambulation/Gait Assistance: 4: Min assist Ambulation Distance (Feet): 300 Feet (100, 200) Assistive device: Rolling walker Ambulation/Gait Assistance Details: pt encouraged to step up in to RW and keep posture erect Gait Pattern: Step-through pattern General Gait Details: pt with LE edema but able to to ambulate with RW Stairs: No Wheelchair  Mobility Wheelchair Mobility: No  ADL: ADL Grooming: Set up;Wash/dry hands;Wash/dry face;Performed Where Assessed - Grooming: Unsupported sitting Upper Body Bathing: Performed;Set up Where Assessed - Upper Body Bathing: Unsupported sitting Lower Body Bathing: Performed;Minimal assistance Where Assessed - Lower Body Bathing: Supported sit to stand Upper Body Dressing: Performed;Minimal assistance Where Assessed - Upper Body Dressing: Unsupported sitting Lower Body Dressing: Performed;Maximal assistance Where Assessed - Lower Body Dressing: Unsupported sit to stand Toilet Transfer: Performed;Min guard Toilet Transfer Method: Stand pivot Acupuncturist: Bedside commode Transfers/Ambulation Related to ADLs: spt only ADL Comments: Pt has swelling in bil feet/legs.  Difficulty lifting them and donning socks  Cognition: Cognition Arousal/Alertness: Awake/alert Orientation Level: Oriented X4 Cognition Overall Cognitive Status: Appears within functional limits for tasks assessed/performed Arousal/Alertness: Awake/alert Orientation Level: Oriented X4 / Intact Behavior During Session: [facility] for tasks performed  Blood pressure 140/80, pulse 77, temperature 97.8 F (36.6 C), temperature source Oral, resp. rate 20, height 5\' 8"  (1.727 m), weight 84 kg (185 lb 3 oz), SpO2 94.00%. Physical  Exam  Constitutional: He is oriented to person, place, and time. He appears well-developed.  HENT:  Head: Normocephalic and atraumatic.  Right Ear: External ear normal.  Left Ear: External ear normal.  Eyes: Conjunctivae normal and EOM are normal. Pupils are equal, round, and reactive to light.  Neck: Normal range of motion.  Cardiovascular: Normal rate and regular rhythm.   Pulmonary/Chest: Effort normal and breath sounds normal.  Abdominal: Soft. He exhibits no distension.  Musculoskeletal: He exhibits edema.       3+ pitting edema bilateral LE, trace UE. Low back tender with proximal LE movement  Neurological: He is alert and oriented to person, place, and time.       Strength 4/5 UE. LE 2/5 proximally (pain is a factor). ADF and APF are 3/5. No sensory findings. DTR's are 1+.  Psychiatric: He has a normal mood and affect. His behavior is normal. Judgment and thought content normal.    Results for orders placed during the hospital encounter of 12/05/12 (from the past 24 hour(s))  CBC WITH DIFFERENTIAL     Status: Abnormal   Collection Time   12/08/12  5:50 AM      Component Value Range   WBC 7.3  4.0 - 10.5 K/uL   RBC 3.24 (*) 4.22 - 5.81 MIL/uL   Hemoglobin 9.8 (*) 13.0 - 17.0 g/dL   HCT 32.9 (*) 51.8 - 84.1 %   MCV 84.6  78.0 - 100.0 fL   MCH 30.2  26.0 - 34.0 pg   MCHC 35.8  30.0 - 36.0 g/dL   RDW 66.0 (*) 63.0 - 16.0 %   Platelets 104 (*) 150 - 400 K/uL   Neutrophils Relative 79 (*) 43 - 77 %   Lymphocytes Relative 14  12 - 46 %   Monocytes Relative 7  3 - 12 %   Eosinophils Relative 0  0 - 5 %   Basophils Relative 0  0 - 1 %   Neutro Abs 5.8  1.7 - 7.7 K/uL   Lymphs Abs 1.0  0.7 - 4.0 K/uL   Monocytes Absolute 0.5  0.1 - 1.0 K/uL   Eosinophils Absolute 0.0  0.0 - 0.7 K/uL   Basophils Absolute 0.0  0.0 - 0.1 K/uL   WBC Morphology INCREASED BANDS (>20% BANDS)    COMPREHENSIVE METABOLIC PANEL     Status: Abnormal   Collection Time   12/08/12  5:50 AM      Component  Value Range   Sodium 130 (*) 135 - 145 mEq/L   Potassium 4.5  3.5 - 5.1 mEq/L   Chloride 106  96 - 112 mEq/L   CO2 19  19 - 32 mEq/L   Glucose, Bld 80  70 - 99 mg/dL   BUN 22  6 - 23 mg/dL   Creatinine, Ser 1.19  0.50 - 1.35 mg/dL   Calcium 7.2 (*) 8.4 - 10.5 mg/dL   Total Protein 3.5 (*) 6.0 - 8.3 g/dL   Albumin 1.4 (*) 3.5 - 5.2 g/dL   AST 12  0 - 37 U/L   ALT 12  0 - 53 U/L   Alkaline Phosphatase 62  39 - 117 U/L   Total Bilirubin 0.2 (*) 0.3 - 1.2 mg/dL   GFR calc non Af Amer 73 (*) >90 mL/min   GFR calc Af Amer 84 (*) >90 mL/min   No results found.  Assessment/Plan: Diagnosis: Graft vs Host Disease, CLL recent diarrhea and FTT with subsequent deconditioning  1. Does the need for close, 24 hr/day medical supervision in concert with the patient's rehab needs make it unreasonable for this patient to be served in a less intensive setting? Yes 2. Co-Morbidities requiring supervision/potential complications: obesity, malnutrition, edema, cmv 3. Due to bladder management, bowel management, safety, skin/wound care, disease management, medication administration, pain management and patient education, does the patient require 24 hr/day rehab nursing? Yes 4. Does the patient require coordinated care of a physician, rehab nurse, PT (1-2 hrs/day, 5 days/week) and OT (1-2 hrs/day, 5 days/week) to address physical and functional deficits in the context of the above medical diagnosis(es)? Yes Addressing deficits in the following areas: balance, endurance, locomotion, strength, transferring, bowel/bladder control, bathing, dressing, feeding, grooming and toileting 5. Can the patient actively participate in an intensive therapy program of at least 3 hrs of therapy per day at least 5 days per week? Yes 6. The potential for patient to make measurable gains while on inpatient rehab is good 7. Anticipated functional outcomes upon discharge from inpatient rehab are supervision with PT, supervision to  minimal assist with OT, n/a with SLP. 8. Estimated rehab length of stay to reach the above functional goals is: 7-10 days  9. Does the patient have adequate social supports to accommodate these discharge functional goals? Yes 10. Anticipated D/C setting: Home 11. Anticipated post D/C treatments: HH therapy 12. Overall Rehab/Functional Prognosis: good  RECOMMENDATIONS: This patient's condition is appropriate for continued rehabilitative care in the following setting: CIR Patient has agreed to participate in recommended program. Yes Note that insurance prior authorization may be required for reimbursement for recommended care.  Comment:Pt has difficulties with transfers, self-care, stamina. Would benefit from the inpatient rehab setting to improve strength, safety, nutrition, and to manage ongoing medical issues. Rehab RN to follow up.   Ivory Broad, MD     12/08/2012

## 2012-12-07 NOTE — Progress Notes (Signed)
Rehab Admissions Coordinator Note:  Patient was screened by Trish Mage for appropriateness for an Inpatient Acute Rehab Consult.  At this time, I am deferring the screen.  A formal inpatient rehab consult has been ordered and is pending completion.  Trish Mage 12/07/2012, 4:11 PM  I can be reached at [phone].

## 2012-12-07 NOTE — Progress Notes (Signed)
Timothy [name]   DOB:November 24, 1946   ZO#:[id number]   JWJ#:[id number]  Subjective: Timothy [name] has had 2 BMs so far today, partly formed. He feels the testosterone is starting to help; he worked hard w [facility] today and is very encouraged about the possibility of 2-3 weeks of intense rehab; wife in room  Objective: middle aged white man examined in bed  Filed Vitals:   12/07/12 1505  BP: 145/82  Pulse: 90  Temp: 98 F (36.7 C)  Resp: 16    Body mass index is 28.16 kg/(m^2).   Face clear; no rash  Sclerae unicteric; eyelids minimally irritated  Oropharynx entirely clear  No peripheral adenopathy  Lungs no rales or rhonchi, good excursion bilaterally  Heart regular rate and rhythm, no murmur appreciated  Abdomen soft, NT, +BS  MSK 1+ bilateral ankle edema; joints flexible;   Neuro nonfocal, A&O x3, positive affect  Skin: rash resolved; normal skin turgor  Labs:  Tacrolimus level pending  Lab Results  Component Value Date   WBC 10.5 12/07/2012   HGB 10.0* 12/07/2012   HCT 27.9* 12/07/2012   MCV 84.0 12/07/2012   PLT 118* 12/07/2012   NEUTROABS 9.0* 12/07/2012    Urine Studies No results found for this basename: UACOL:2,UAPR:2,USPG:2,UPH:2,UTP:2,UGL:2,UKET:2,UBIL:2,UHGB:2,UNIT:2,UROB:2,ULEU:2,UEPI:2,UWBC:2,URBC:2,UBAC:2,CAST:2,CRYS:2,UCOM:2,BILUA:2 in the last 72 hours  Basic Metabolic Panel:  Lab 12/07/12 6213 12/06/12 0635 12/05/12 1507 12/03/12 0615 12/02/12 0540  NA 130* 126* 129* 131* 132*  K 4.6 4.2 -- -- --  CL 103 100 102 102 104  CO2 18* 18* 20 20 20   GLUCOSE 112* 99 82 90 84  BUN 23 17 16 19 18   CREATININE 1.01 0.87 0.87 0.94 0.89  CALCIUM 7.1* 7.1* 7.3* 7.1* 7.1*  MG 1.6 1.5 1.6 1.6 1.6  PHOS 2.7 -- -- -- --   GFR Estimated Creatinine Clearance: 75.9 ml/min (by C-G formula based on Cr of 1.01). Liver Function Tests:  Lab 12/07/12 0530 12/06/12 0635 12/05/12 1507 12/03/12 0615 12/02/12 0540  AST 15 16 16 15 19   ALT 13 14 16 19 23   ALKPHOS 57 52 56 49 47  BILITOT  0.3 0.3 0.3 0.2* 0.3  PROT 3.7* 3.8* 4.2* 2.8* 2.8*  ALBUMIN 1.3* 1.2* 1.3* 1.3* 1.4*   No results found for this basename: LIPASE:5,AMYLASE:5 in the last 168 hours No results found for this basename: AMMONIA:5 in the last 168 hours Coagulation profile No results found for this basename: INR:5,PROTIME:5 in the last 168 hours  CBC:  Lab 12/07/12 0530 12/06/12 0635 12/05/12 1507 12/03/12 0615 12/02/12 0540  WBC 10.5 1.6* 3.1* 2.7* 2.5*  NEUTROABS 9.0* 0.9* 1.8 0.9* 1.5*  HGB 10.0* 10.0* 11.1* 10.0* 10.4*  HCT 27.9* 28.5* 30.8* 28.3* 28.7*  MCV 84.0 84.6 83.7 84.5 84.4  PLT 118* 97* 112* 88* 71*   Cardiac Enzymes: No results found for this basename: CKTOTAL:5,CKMB:5,CKMBINDEX:5,TROPONINI:5 in the last 168 hours BNP: No components found with this basename: POCBNP:5 CBG: No results found for this basename: GLUCAP:5 in the last 168 hours D-Dimer No results found for this basename: DDIMER:2 in the last 72 hours Hgb A1c No results found for this basename: HGBA1C:2 in the last 72 hours Lipid Profile No results found for this basename: CHOL:2,HDL:2,LDLCALC:2,TRIG:2,CHOLHDL:2,LDLDIRECT:2 in the last 72 hours Thyroid function studies No results found for this basename: TSH,T4TOTAL,FREET3,T3FREE,THYROIDAB in the last 72 hours Anemia work up No results found for this basename: VITAMINB12:2,FOLATE:2,FERRITIN:2,TIBC:2,IRON:2,RETICCTPCT:2 in the last 72 hours Microbiology Recent Results (from the past 240 hour(s))  CLOSTRIDIUM DIFFICILE BY PCR  Status: Normal   Collection Time   12/06/12  2:30 AM      Component Value Range Status Comment   C difficile by pcr NEGATIVE  NEGATIVE Final    Rotovirus negative   Studies:  X-ray Chest Pa And Lateral   12/05/2012  *RADIOLOGY REPORT*  Clinical Data: Decreased breath sounds right lung.  Bone marrow transplant patient.  History of CLL.  CHEST - 2 VIEW  Comparison: 11/16/2012  Findings: Right IJ central venous catheter is unchanged.  Lungs are  adequately inflated with mild opacification of the left base compatible with a worsening small left effusion.  Cannot exclude associated atelectasis or infection in the left base. Cardiomediastinal silhouette is unchanged.  There is mild loss of height of a lower thoracic vertebrae without significant change.  IMPRESSION: Worsening opacification in the left base compatible a small left effusion as cannot exclude associated atelectasis / infection in the left base.  Right-sided central venous catheter unchanged.  Stable mild compression deformity of the mid to lower thoracic spine.   Original Report Authenticated By: Elberta Fortis, M.D.     Assessment: 66 y.o. [place] man with a history of well-differentiated lymphocytic lymphoma/ chronic lymphoid leukemia initially diagnosed in 2000, not requiring intervention until 2006; with multiple chromosomal abnormalities; s/p allogeneic transplant March 2013 at [facility]", now admitted with GVHD.  His treatment history is as follows:   (1) fludarabine/cyclophosphamide/rituximab x5 completed May 2007.  (2) rituximab for 8 doses October 2010, with partial response  (3) Leustatin and ofatumumab weekly x8 July to September 2011 followed by maintenance ofatumumab maintenance ofatumumab every 2 months, with initial response but rising counts September 2012  (4) status-post unrelated donor stem-cell transplant 02/24/2012 at [facility]  (a) conditioning regimen consisted of fludarabine + TBI at 200 cGy, followed by rituximab x27;  (b) CMV reactivation x3 (patient CMV positive, donor negative), s/p ganciclovir treatment; 3d reactivation August 2013, s/p gancyclovir, with negative PCR mid-September 2013; last gancyclovir dose 10/06/2012  (c) GVHD: involving gut and skin, treated with steroids, tacrolimus and MMF  (d) atrial fibrillation: resolved on amiodarone  (e) steroid-induced myopathy: improving until current crisis (f) hypomagnesemia: resolved with  discontinuation of gancyclovir (g) hypogammaglobulinemia: s/p IVIG most recently 10/01/2012; IgG 617 on11/20/2013 (h) elevated triglycerides (606 on 07/14/2012)  (i) adrenal insufficiency: on prednisone  (j) pancytopenia; off growth factors (5) restaging studies September 2013 including CT scans, flow cytometry, and bone marrow biopsy, showed no evidence of residual chronic lymphoid leukemia.  (6) now with a skin rash, mouth changes, severe diarrhea and gastric/duodenal/colonic biopsies 11/17/2012 c/w GVHD grade 2 (7) severe malnutrition (8) pancytopenia  Plan: Aurther Loft' s  diarrhe is C-diff negative. It is due to his graft-vs-host disease, which is now grade 1. I am changing all his meds to po. If he does get transferred to Rehab I will see him there every other day and review labs meds etc daily--that was reassuring to him. Remains full code  Jody Aguinaga C 12/07/2012

## 2012-12-07 NOTE — Evaluation (Signed)
Occupational Therapy Evaluation Patient Details Name: Timothy [name] MRN: [id number] DOB: [birth year] Today's Date: 12/07/2012 Time: 0827-0903 OT Time Calculation (min): 36 min  OT Assessment / Plan / Recommendation Clinical Impression  This 66 year old man was readmitted from home with diarrhea and weakness.  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  Pt is appropriate for skilled OT to increase strength and activity tolerance to reach a supervision level with adls.    OT Assessment  Patient needs continued OT Services    Follow Up Recommendations  CIR    Barriers to Discharge      Equipment Recommendations  3 in 1 bedside comode    Recommendations for Other Services Rehab consult  Frequency  Min 2X/week    Precautions / Restrictions Precautions Precautions: Fall Restrictions Weight Bearing Restrictions: No   Pertinent Vitals/Pain No c/o pain    ADL  Grooming: Set up;Wash/dry hands;Wash/dry face;Performed Where Assessed - Grooming: Unsupported sitting Upper Body Bathing: Performed;Set up Where Assessed - Upper Body Bathing: Unsupported sitting Lower Body Bathing: Performed;Minimal assistance Where Assessed - Lower Body Bathing: Supported sit to stand Upper Body Dressing: Performed;Minimal assistance Where Assessed - Upper Body Dressing: Unsupported sitting Lower Body Dressing: Performed;Maximal assistance Where Assessed - Lower Body Dressing: Unsupported sit to stand Toilet Transfer: Performed;Min guard Toilet Transfer Method: Surveyor, minerals: Materials engineer and Hygiene: Performed;Min guard Where Assessed - Engineer, mining and Hygiene: Sit to stand from 3-in-1 or toilet Transfers/Ambulation Related to ADLs: spt only ADL Comments: Pt has swelling in bil feet/legs.  Difficulty lifting them and donning socks    OT Diagnosis:  Generalized weakness  OT Problem List: Decreased strength;Decreased activity tolerance;Impaired balance (sitting and/or standing);Decreased coordination;Decreased knowledge of use of DME or AE;Increased edema OT Treatment Interventions: Self-care/ADL training;Therapeutic exercise;DME and/or AE instruction;Therapeutic activities;Patient/family education;Energy conservation;Balance training   OT Goals Acute Rehab OT Goals OT Goal Formulation: With patient Time For Goal Achievement: 12/21/12 Potential to Achieve Goals: Good ADL Goals Pt Will Perform Grooming: with supervision;Standing at sink ADL Goal: Grooming - Progress: Goal set today Pt Will Perform Lower Body Bathing: with supervision;Sit to stand from bed;with adaptive equipment ADL Goal: Lower Body Bathing - Progress: Goal set today Pt Will Perform Lower Body Dressing: with supervision;Sit to stand from chair;with adaptive equipment ADL Goal: Lower Body Dressing - Progress: Goal set today Pt Will Transfer to Toilet: with supervision;Ambulation;Regular height toilet ADL Goal: Toilet Transfer - Progress: Goal set today Pt Will Perform Tub/Shower Transfer: Shower transfer;with supervision;Ambulation;Shower seat with back ADL Goal: Web designer - Progress: Goal set today Miscellaneous OT Goals Miscellaneous OT Goal #1: Pt will be able to open all adl containers with set up OT Goal: Miscellaneous Goal #1 - Progress: Goal set today  Visit Information  Last OT Received On: 12/07/12    Subjective Data  Subjective: I've been having fun with the putty Patient Stated Goal: Rehab facility   Prior Functioning     Home Living Lives With: Spouse Available Help at Discharge: Family Type of Home: House Home Access: Stairs to enter Secretary/administrator of Steps: 2 Entrance Stairs-Rails: Right Home Layout: Two level;Bed/bath upstairs Alternate Level Stairs-Number of Steps: 10 Alternate Level Stairs-Rails: Right;Left Bathroom  Shower/Tub: Health visitor: Standard Home Adaptive Equipment: Shower chair with back Prior Function Level of Independence: Independent with assistive device(s) Able to Take Stairs?: Yes Driving: Yes 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 Communication Communication: No difficulties         Vision/Perception     Cognition  Overall Cognitive Status: Appears within functional limits for tasks assessed/performed Arousal/Alertness: Awake/alert Orientation Level: Oriented X4 / Intact Behavior During Session: [facility] for tasks performed    Extremity/Trunk Assessment Right Upper Extremity Assessment RUE ROM/Strength/Tone: Within functional levels (bil tremor; hand grip 4-/5 bil) RUE Coordination:  (pt reports using red theraputty given) Left Upper Extremity Assessment LUE ROM/Strength/Tone: Within functional levels     Mobility Bed Mobility Bed Mobility: Supine to Sit Supine to Sit: 4: Min assist;With rails;HOB elevated Sit to Supine: 4: Min assist Details for Bed Mobility Assistance: assistance for legs onto bed. Transfers Sit to Stand: 4: Min guard;From bed;From chair/3-in-1     Shoulder Instructions     Exercise     Balance     End of Session OT - End of Session Activity Tolerance: Patient limited by fatigue Patient left: in bed;with call bell/phone within reach  GO     [facility] 12/07/2012, 9:13 AM Marica Otter, OTR/L [phone] 12/07/2012

## 2012-12-07 NOTE — Progress Notes (Signed)
RECEIVED A FAX FROM [facility] CONCERNING A PRIOR AUTHORIZATION FOR ANDRODERM PATCH, THIS REQUEST WAS GIVEN TO ELIZABETH SUTTON IN MANAGED CARE.

## 2012-12-07 NOTE — Evaluation (Signed)
Physical Therapy Evaluation Patient Details Name: Timothy [name] MRN: [id number] DOB: 1946/04/30 Today's Date: 12/07/2012 Time: 1191-4782 PT Time Calculation (min): 30 min  PT Assessment / Plan / Recommendation Clinical Impression  Pt readmitted with diarrhea and inability to manage steps at home.  Pt will benefit from continued PT for LE strengthening and optimal functional independence    PT Assessment  Patient needs continued PT services    Follow Up Recommendations  CIR    Does the patient have the potential to tolerate intense rehabilitation    yes  Barriers to Discharge   bedroom on second floor    Equipment Recommendations  Rolling walker with 5" wheels    Recommendations for Other Services     Frequency Min 3X/week    Precautions / Restrictions     Pertinent Vitals/Pain No c/o pain      Mobility  Bed Mobility Bed Mobility: Supine to Sit Supine to Sit: 4: Min assist;With rails;HOB elevated Sit to Supine: 4: Min assist Details for Bed Mobility Assistance: assistance for legs onto bed. Transfers Sit to Stand: 4: Min guard;From bed;From chair/3-in-1 Stand to Sit: 4: Min guard Details for Transfer Assistance: verbal cues to push up with arms from bed Ambulation/Gait Ambulation/Gait Assistance: 4: Min assist Ambulation Distance (Feet): 300 Feet (100, 200) Assistive device: Rolling walker Ambulation/Gait Assistance Details: pt encouraged to step up in to RW and keep posture erect Gait Pattern: Step-through pattern General Gait Details: pt with LE edema but able to to ambulate with RW Stairs: No Wheelchair Mobility Wheelchair Mobility: No    Shoulder Instructions     Exercises General Exercises - Lower Extremity Ankle Circles/Pumps: AAROM;Both;5 reps;Supine Quad Sets: AROM;Both;5 reps;Supine Gluteal Sets: AROM;Both;5 reps;Standing Short Arc Quad: AROM;Both;10 reps;Supine Other Exercises Other Exercises: deep breathing Other Exercises: abd sets   PT  Diagnosis: Difficulty walking;Generalized weakness  PT Problem List: Decreased strength;Decreased activity tolerance;Decreased mobility PT Treatment Interventions: DME instruction;Gait training;Stair training;Functional mobility training;Therapeutic activities;Therapeutic exercise;Patient/family education   PT Goals Acute Rehab PT Goals PT Goal Formulation: With patient Time For Goal Achievement: 12/21/12 Potential to Achieve Goals: Good Pt will go Supine/Side to Sit: Independently PT Goal: Supine/Side to Sit - Progress: Goal set today Pt will go Sit to Supine/Side: Independently PT Goal: Sit to Supine/Side - Progress: Goal set today Pt will go Sit to Stand: with modified independence PT Goal: Sit to Stand - Progress: Goal set today Pt will go Stand to Sit: with modified independence PT Goal: Stand to Sit - Progress: Goal set today Pt will Ambulate: >150 feet;with modified independence;with least restrictive assistive device PT Goal: Ambulate - Progress: Goal set today Pt will Go Up / Down Stairs: 6-9 stairs;with rail(s);with supervision PT Goal: Up/Down Stairs - Progress: Goal set today  Visit Information  Last PT Received On: 12/07/12 Assistance Needed: +1    Subjective Data  Subjective: I want a rehab place that can manage all my problems Patient Stated Goal: to talk to his drs in [place] and also dr. Darnelle Catalan re: rehab centers at [facility] and [facility] re rehab for transplant pts   Prior Functioning  Home Living Lives With: Spouse Available Help at Discharge: Family Type of Home: House Home Access: Stairs to enter Secretary/administrator of Steps: 2 Entrance Stairs-Rails: Right Home Layout: Two level;Bed/bath upstairs Alternate Level Stairs-Number of Steps: 10 Alternate Level Stairs-Rails: Right;Left Bathroom Shower/Tub: Health visitor: Standard Home Adaptive Equipment: Shower chair with back Prior Function Level of Independence:  Independent with  assistive device(s) Able to Take Stairs?: Yes Driving: Yes Vocation: Full time employment Communication Communication: No difficulties    Cognition  Overall Cognitive Status: Appears within functional limits for tasks assessed/performed Arousal/Alertness: Awake/alert Orientation Level: Oriented X4 / Intact Behavior During Session: [facility] for tasks performed    Extremity/Trunk Assessment Right Upper Extremity Assessment RUE ROM/Strength/Tone: Within functional levels (bil tremor; hand grip 4-/5 bil) RUE Coordination:  (pt reports using red theraputty given) Left Upper Extremity Assessment LUE ROM/Strength/Tone: Within functional levels Right Lower Extremity Assessment RLE ROM/Strength/Tone: Deficits RLE ROM/Strength/Tone Deficits: pt with pitting edema in lower legs.  Diffuse muscle atophy Left Lower Extremity Assessment LLE ROM/Strength/Tone: Deficits LLE ROM/Strength/Tone Deficits: pitting edema in lower leg.  diffuse muscle atophy Trunk Assessment Trunk Assessment: Other exceptions Trunk Exceptions: diffuse muscle atrophy   Balance Balance Balance Assessed: Yes Static Sitting Balance Static Sitting - Balance Support: No upper extremity supported;Feet supported Static Sitting - Comment/# of Minutes: > 3 minutes,  Pt encouraged to have trunk extension Static Standing Balance Static Standing - Balance Support: Bilateral upper extremity supported Static Standing - Level of Assistance: 5: Stand by assistance Static Standing - Comment/# of Minutes: pt uses RW for support  End of Session PT - End of Session Equipment Utilized During Treatment: Gait belt Activity Tolerance: Patient tolerated treatment well Patient left: with call bell/phone within reach;in chair;Other (comment) (legs elevated)  GP     Bayard Hugger. [place], [place] [phone] 12/07/2012, 3:40 PM

## 2012-12-08 DIAGNOSIS — C9112 Chronic lymphocytic leukemia of B-cell type in relapse: Secondary | ICD-10-CM

## 2012-12-08 DIAGNOSIS — R5381 Other malaise: Secondary | ICD-10-CM

## 2012-12-08 LAB — CBC WITH DIFFERENTIAL/PLATELET
Basophils Relative: 0 % (ref 0–1)
Eosinophils Relative: 0 % (ref 0–5)
HCT: 27.4 % — ABNORMAL LOW (ref 39.0–52.0)
Hemoglobin: 9.8 g/dL — ABNORMAL LOW (ref 13.0–17.0)
Lymphocytes Relative: 14 % (ref 12–46)
MCHC: 35.8 g/dL (ref 30.0–36.0)
Monocytes Relative: 7 % (ref 3–12)
Neutro Abs: 5.8 10*3/uL (ref 1.7–7.7)
Neutrophils Relative %: 79 % — ABNORMAL HIGH (ref 43–77)
RBC: 3.24 MIL/uL — ABNORMAL LOW (ref 4.22–5.81)
WBC: 7.3 10*3/uL (ref 4.0–10.5)

## 2012-12-08 LAB — COMPREHENSIVE METABOLIC PANEL
ALT: 12 U/L (ref 0–53)
Alkaline Phosphatase: 62 U/L (ref 39–117)
BUN: 22 mg/dL (ref 6–23)
CO2: 19 mEq/L (ref 19–32)
Chloride: 106 mEq/L (ref 96–112)
GFR calc Af Amer: 84 mL/min — ABNORMAL LOW (ref >90)
Glucose, Bld: 80 mg/dL (ref 70–99)
Potassium: 4.5 mEq/L (ref 3.5–5.1)
Sodium: 130 mEq/L — ABNORMAL LOW (ref 135–145)
Total Bilirubin: 0.2 mg/dL — ABNORMAL LOW (ref 0.3–1.2)
Total Protein: 3.5 g/dL — ABNORMAL LOW (ref 6.0–8.3)

## 2012-12-08 LAB — TACROLIMUS, BLOOD: Tacrolimus Lvl: 7.5 ng/mL (ref 5.0–20.0)

## 2012-12-08 MED ORDER — SERTRALINE HCL 100 MG PO TABS: 100.0000 mg | ORAL_TABLET | ORAL | Status: DC

## 2012-12-08 MED ORDER — VITAL 1.5 CAL PO LIQD: 237.0000 mL | Freq: Two times a day (BID) | ORAL | Status: DC

## 2012-12-08 MED ORDER — ACETAMINOPHEN 325 MG PO TABS: 650.0000 mg | ORAL_TABLET | Freq: Four times a day (QID) | ORAL | Status: DC | PRN

## 2012-12-08 MED ORDER — ENOXAPARIN SODIUM 40 MG/0.4ML ~~LOC~~ SOLN: 40.0000 mg | SUBCUTANEOUS | Status: DC

## 2012-12-08 MED ORDER — PANTOPRAZOLE SODIUM 40 MG PO TBEC: 40.0000 mg | DELAYED_RELEASE_TABLET | Freq: Every day | ORAL | Status: DC

## 2012-12-08 NOTE — Progress Notes (Signed)
Met with pt at bedside to discuss CIR. Pt is very deconditioned and would benefit from inpatient rehab prior to returning home. He is eager to come to rehab. Pt has a supportive wife who can provide 24/7 assist as needed after d/c. Pt's insurance has approved his admission to CIR. Await notification from Dr Darnelle Catalan that pt is ready for d/c to CIR today.  For questions, please call [phone].

## 2012-12-08 NOTE — Progress Notes (Signed)
Dr. Arlice Colt in to see patient. Still w/loose stool. Unable to prepare for transfer to CIR due to scheduling difficulties. Plan for transfer in AM. [place] w/CIR notified by M. Pearson,CM.[place]ley Barefoot

## 2012-12-08 NOTE — Progress Notes (Signed)
Pt was approved for CIR and plan to transfer today.

## 2012-12-08 NOTE — Discharge Summary (Signed)
Physician Discharge Summary  Patient ID: Timothy [name] MRN: [id number] DOB/AGE: 66-Dec-1947 66 y.o.  Admit date: 12/05/2012 Discharge date: 12/08/2012  Primary Care Physician:  Kari Baars, MD   Discharge Diagnoses:  Malnutrition, deconditioning, rash, diarrhea due to GVHD, hypogonadism, adrenal insufficiency, pancytopenia (moderate), history CLL, likeley cured  Present on Admission:  . Chronic GVHD complicating bone marrow transplantation  Discharge Medications:    Medication List     As of 12/08/2012  4:04 PM    STOP taking these medications         levofloxacin 500 MG tablet   Commonly known as: LEVAQUIN      omeprazole 20 MG capsule   Commonly known as: PRILOSEC   Replaced by: pantoprazole 40 MG tablet      TAKE these medications         acetaminophen 325 MG tablet   Commonly known as: TYLENOL   Take 2 tablets (650 mg total) by mouth every 6 (six) hours as needed (or Fever >/= 101).      acyclovir 400 MG tablet   Commonly known as: ZOVIRAX   Take 1 tablet (400 mg total) by mouth 2 (two) times daily.      antiseptic oral rinse Liqd   15 mLs by Mouth Rinse route 2 times daily at 12 noon and 4 pm.      aspirin 81 MG chewable tablet   Chew 1 tablet (81 mg total) by mouth daily.      budesonide 3 MG 24 hr capsule   Commonly known as: ENTOCORT EC   Take 3 capsules (9 mg total) by mouth every morning.      CENTRUM SILVER ADULT 50+ Tabs   Take 1 tablet by mouth every evening.      cholecalciferol 1000 UNITS tablet   Commonly known as: VITAMIN D   Take 1,000 Units by mouth every evening.      CITRACAL PO   Take 1,200 mg by mouth 2 (two) times daily.      diphenoxylate-atropine 2.5-0.025 MG per tablet   Commonly known as: LOMOTIL   Take 1 to 2 tablets as needed for diarrhea, maximum 6 per day      enoxaparin 40 MG/0.4ML injection   Commonly known as: LOVENOX   Inject 0.4 mLs (40 mg total) into the skin daily.      feeding supplement (VITAL  1.5 CAL) Liqd   Take 237 mLs by mouth 2 (two) times daily.      feeding supplement (VITAL 1.5 CAL) Liqd   Take 237 mLs by mouth 2 (two) times daily.      Fish Oil 300 MG Caps   Take 600 mg by mouth every morning.      fluconazole 100 MG tablet   Commonly known as: DIFLUCAN   Take 100 mg by mouth every morning.      heparin flush (porcine) 100 UNIT/ML injection   Inject 3 mLs into the vein every evening. Through chest      labetalol 200 MG tablet   Commonly known as: NORMODYNE   Take 100 mg by mouth 2 (two) times daily.      lisinopril 10 MG tablet   Commonly known as: PRINIVIL,ZESTRIL   Take 5 mg by mouth at bedtime.      loperamide 2 MG capsule   Commonly known as: IMODIUM   Take 1 capsule (2 mg total) by mouth as needed for diarrhea or loose stools (Given with each loose stool not  to exceed 4 tablets daily).      meclizine 25 MG tablet   Commonly known as: ANTIVERT   Take 25 mg by mouth Twice daily as needed. For dizziness      pantoprazole 40 MG tablet   Commonly known as: PROTONIX   Take 1 tablet (40 mg total) by mouth daily.      predniSONE 10 MG tablet   Commonly known as: DELTASONE   Take 5 mg by mouth daily with breakfast.      prochlorperazine 10 MG tablet   Commonly known as: COMPAZINE   Take 10 mg by mouth every 6 (six) hours as needed. Nausea      sertraline 50 MG tablet   Commonly known as: ZOLOFT   Take 50-100 mg by mouth daily. Takes 50 mg one day and 100 mg the next day      sertraline 100 MG tablet   Commonly known as: ZOLOFT   Take 1 tablet (100 mg total) by mouth every other day.      sodium chloride 0.9 % injection   Inject 7 mLs into the vein every evening. Through chest      sulfamethoxazole-trimethoprim 800-160 MG per tablet   Commonly known as: BACTRIM DS   Take 1 tablet by mouth 2 (two) times a week. Mondays and Tuesdays, twice daily      tacrolimus 0.5 MG capsule   Commonly known as: PROGRAF   Take 1.5 mg by mouth 2 (two) times  daily.      testosterone 4 MG/24HR Pt24 patch   Commonly known as: ANDRODERM   Place 1 patch onto the skin daily.      ASK your doctor about these medications         ondansetron 8 MG disintegrating tablet   Commonly known as: ZOFRAN-ODT   Take 8 mg by mouth 2 (two) times daily as needed. For nausea         Disposition and Follow-up: to [facility]; I will follow QOD  Significant Diagnostic Studies:  X-ray Chest Pa And Lateral   12/05/2012  *RADIOLOGY REPORT*  Clinical Data: Decreased breath sounds right lung.  Bone marrow transplant patient.  History of CLL.  CHEST - 2 VIEW  Comparison: 11/16/2012  Findings: Right IJ central venous catheter is unchanged.  Lungs are adequately inflated with mild opacification of the left base compatible with a worsening small left effusion.  Cannot exclude associated atelectasis or infection in the left base. Cardiomediastinal silhouette is unchanged.  There is mild loss of height of a lower thoracic vertebrae without significant change.  IMPRESSION: Worsening opacification in the left base compatible a small left effusion as cannot exclude associated atelectasis / infection in the left base.  Right-sided central venous catheter unchanged.  Stable mild compression deformity of the mid to lower thoracic spine.   Original Report Authenticated By: Elberta Fortis, M.D.    X-ray Chest Pa And Lateral   11/16/2012  *RADIOLOGY REPORT*  Clinical Data: Shortness of breath and weakness  CHEST - 2 VIEW  Comparison: 08/04/2012  Findings: The heart and pulmonary vascularity are within normal limits.  The lungs are clear bilaterally.  The previously seen bibasilar changes and effusions have resolved in the interval.  A right-sided central venous catheter is again noted.  IMPRESSION: No acute abnormality noted.   Original Report Authenticated By: Alcide Clever, M.D.     Discharge Laboratory Values: Basic Metabolic Panel:  Lab 12/08/12 1610 12/07/12 0530 12/06/12 9604 12/05/12  1507  12/03/12 0615 12/02/12 0540  NA 130* 130* 126* 129* 131* --  K 4.5 4.6 -- -- -- --  CL 106 103 100 102 102 --  CO2 19 18* 18* 20 20 --  GLUCOSE 80 112* 99 82 90 --  BUN 22 23 17 16 19  --  CREATININE 1.04 1.01 0.87 0.87 0.94 --  CALCIUM 7.2* 7.1* 7.1* 7.3* 7.1* --  MG -- 1.6 1.5 1.6 1.6 1.6  PHOS -- 2.7 -- -- -- --   GFR Estimated Creatinine Clearance: 73.7 ml/min (by C-G formula based on Cr of 1.04). Liver Function Tests:  Lab 12/08/12 0550 12/07/12 0530 12/06/12 0635 12/05/12 1507 12/03/12 0615  AST 12 15 16 16 15   ALT 12 13 14 16 19   ALKPHOS 62 57 52 56 49  BILITOT 0.2* 0.3 0.3 0.3 0.2*  PROT 3.5* 3.7* 3.8* 4.2* 2.8*  ALBUMIN 1.4* 1.3* 1.2* 1.3* 1.3*   No results found for this basename: LIPASE:5,AMYLASE:5 in the last 168 hours No results found for this basename: AMMONIA:5 in the last 168 hours Coagulation profile No results found for this basename: INR:5,PROTIME:5 in the last 168 hours  CBC:  Lab 12/08/12 0550 12/07/12 0530 12/06/12 0635 12/05/12 1507 12/03/12 0615  WBC 7.3 10.5 1.6* 3.1* 2.7*  NEUTROABS 5.8 9.0* 0.9* 1.8 0.9*  HGB 9.8* 10.0* 10.0* 11.1* 10.0*  HCT 27.4* 27.9* 28.5* 30.8* 28.3*  MCV 84.6 84.0 84.6 83.7 84.5  PLT 104* 118* 97* 112* 88*   Cardiac Enzymes: No results found for this basename: CKTOTAL:5,CKMB:5,CKMBINDEX:5,TROPONINI:5 in the last 168 hours BNP: No components found with this basename: POCBNP:5 CBG: No results found for this basename: GLUCAP:5 in the last 168 hours D-Dimer No results found for this basename: DDIMER:2 in the last 72 hours Hgb A1c No results found for this basename: HGBA1C:2 in the last 72 hours Lipid Profile No results found for this basename: CHOL:2,HDL:2,LDLCALC:2,TRIG:2,CHOLHDL:2,LDLDIRECT:2 in the last 72 hours Thyroid function studies No results found for this basename: TSH,T4TOTAL,FREET3,T3FREE,THYROIDAB in the last 72 hours Anemia work up No results found for this basename:  VITAMINB12:2,FOLATE:2,FERRITIN:2,TIBC:2,IRON:2,RETICCTPCT:2 in the last 72 hours Microbiology Recent Results (from the past 240 hour(s))  CLOSTRIDIUM DIFFICILE BY PCR     Status: Normal   Collection Time   12/06/12  2:30 AM      Component Value Range Status Comment   C difficile by pcr NEGATIVE  NEGATIVE Final      Brief H and P: For complete details please refer to admission H and P, but in brief, patient was readmitted after 72 hours at home, when it became clear his wife could not adequately manage his needs, with exacerbation of his GVHD  Physical Exam at Discharge: BP 143/89  Pulse 84  Temp 97.8 F (36.6 C) (Oral)  Resp 20  Ht 5\' 8"  (1.727 m)  Wt 185 lb 3 oz (84 kg)  BMI 28.16 kg/m2  SpO2 96% Gen: middle aged white male examined in bed Cardiovascular:RRR, no murmur appreciated Respiratory: no rales or rhonchi Gastrointestinal: soft, NT, +BS Extremities:1+ bilateral ankle edema Neuro: nonfocal, A&O x3, positive affect    Hospital Course:  Principal Problem:  *Chronic GVHD complicating bone marrow transplantation Active Problems:  Diarrhea in adult patient  CLL (chronic lymphocytic leukemia)  Rash of face  66 y.o. [place] man with a history of well-differentiated lymphocytic lymphoma/ chronic lymphoid leukemia initially diagnosed in 2000, not requiring intervention until 2006; with multiple chromosomal abnormalities; s/p allogeneic transplant March 2013 at [facility]", now admitted with  GVHD. His treatment history is as follows:  (1) fludarabine/cyclophosphamide/rituximab x5 completed May 2007.  (2) rituximab for 8 doses October 2010, with partial response  (3) Leustatin and ofatumumab weekly x8 July to September 2011 followed by maintenance ofatumumab maintenance ofatumumab every 2 months, with initial response but rising counts September 2012  (4) status-post unrelated donor stem-cell transplant 02/24/2012 at the [facility]  (a) conditioning regimen consisted of  fludarabine + TBI at 200 cGy, followed by rituximab x27;  (b) CMV reactivation x3 (patient CMV positive, donor negative), s/p ganciclovir treatment; 3d reactivation August 2013, s/p gancyclovir, with negative PCR mid-September 2013; last gancyclovir dose 10/06/2012  (c) GVHD: involving gut and skin, treated with steroids, tacrolimus and MMF initially, MMF eventually d/c'd and tacrolimus tapered  (d) atrial fibrillation: resolved on amiodarone  (e) steroid-induced myopathy: improving until current crisis  (f) hypomagnesemia: resolved with discontinuation of gancyclovir  (g) hypogammaglobulinemia: s/p IVIG most recently 12/03/2012  (h) elevated triglycerides (606 on 07/14/2012)  (i) adrenal insufficiency: on prednisone  (j) pancytopenia; off growth factors  (5) restaging studies September 2013 including CT scans, flow cytometry, and bone marrow biopsy, showed no evidence of residual chronic lymphoid leukemia.  (6) now with a skin rash, mouth changes, severe diarrhea and gastric/duodenal/colonic biopsies 11/17/2012 c/w GVHD grade 2, now down to grade 1 (BMs better formed, 2-3/24h, no rash, no chronic skin changes)  (7) severe malnutrition  (8) pancytopenia--stable  (9) testosterone deficiency--being supplemented  (10) deconditioning: REHAB planned   Diet:  regular  Activity:  As tolerated  Condition at Discharge:   improved  Signed: Dr. Ruthann Cancer [phone]  12/08/2012, 4:04 PM

## 2012-12-08 NOTE — Progress Notes (Signed)
Timothy [name]   DOB:02/20/1946   NW#:[id number]   MVH#:[id number]  Subjective: One BM past 12 hrs, partly formed; no rash; no mouth problems; getting OOB more; very hopeful of transfer to [facility]  Objective: middle aged white man . Filed Vitals:   12/08/12 0615  BP: 140/80  Pulse: 77  Temp: 97.8 F (36.6 C)  Resp: 20    Body mass index is 28.16 kg/(m^2).   Face clear; no rash  Sclerae unicteric.  Oropharynx clear  No peripheral adenopathy  Lungs no rales or rhonchi, good excursion bilaterally  Heart regular rate and rhythm, no murmur appreciated  Abdomen soft, NT, +BS  MSK 1+ bilateral ankle edema; joints flexible;   Neuro nonfocal, A&O x3, positive affect  Skin: rash resolved; normal skin turgor  Labs:  Tacrolimus level pending  Lab Results  Component Value Date   WBC 7.3 12/08/2012   HGB 9.8* 12/08/2012   HCT 27.4* 12/08/2012   MCV 84.6 12/08/2012   PLT 104* 12/08/2012   NEUTROABS 5.8 12/08/2012    Urine Studies No results found for this basename: UACOL:2,UAPR:2,USPG:2,UPH:2,UTP:2,UGL:2,UKET:2,UBIL:2,UHGB:2,UNIT:2,UROB:2,ULEU:2,UEPI:2,UWBC:2,URBC:2,UBAC:2,CAST:2,CRYS:2,UCOM:2,BILUA:2 in the last 72 hours  Basic Metabolic Panel:  Lab 12/08/12 8413 12/07/12 0530 12/06/12 0635 12/05/12 1507 12/03/12 0615 12/02/12 0540  NA 130* 130* 126* 129* 131* --  K 4.5 4.6 -- -- -- --  CL 106 103 100 102 102 --  CO2 19 18* 18* 20 20 --  GLUCOSE 80 112* 99 82 90 --  BUN 22 23 17 16 19  --  CREATININE 1.04 1.01 0.87 0.87 0.94 --  CALCIUM 7.2* 7.1* 7.1* 7.3* 7.1* --  MG -- 1.6 1.5 1.6 1.6 1.6  PHOS -- 2.7 -- -- -- --   GFR Estimated Creatinine Clearance: 73.7 ml/min (by C-G formula based on Cr of 1.04). Liver Function Tests:  Lab 12/08/12 0550 12/07/12 0530 12/06/12 0635 12/05/12 1507 12/03/12 0615  AST 12 15 16 16 15   ALT 12 13 14 16 19   ALKPHOS 62 57 52 56 49  BILITOT 0.2* 0.3 0.3 0.3 0.2*  PROT 3.5* 3.7* 3.8* 4.2* 2.8*  ALBUMIN 1.4* 1.3* 1.2* 1.3* 1.3*   No results  found for this basename: LIPASE:5,AMYLASE:5 in the last 168 hours No results found for this basename: AMMONIA:5 in the last 168 hours Coagulation profile No results found for this basename: INR:5,PROTIME:5 in the last 168 hours  CBC:  Lab 12/08/12 0550 12/07/12 0530 12/06/12 0635 12/05/12 1507 12/03/12 0615  WBC 7.3 10.5 1.6* 3.1* 2.7*  NEUTROABS 5.8 9.0* 0.9* 1.8 0.9*  HGB 9.8* 10.0* 10.0* 11.1* 10.0*  HCT 27.4* 27.9* 28.5* 30.8* 28.3*  MCV 84.6 84.0 84.6 83.7 84.5  PLT 104* 118* 97* 112* 88*   Cardiac Enzymes: No results found for this basename: CKTOTAL:5,CKMB:5,CKMBINDEX:5,TROPONINI:5 in the last 168 hours BNP: No components found with this basename: POCBNP:5 CBG: No results found for this basename: GLUCAP:5 in the last 168 hours D-Dimer No results found for this basename: DDIMER:2 in the last 72 hours Hgb A1c No results found for this basename: HGBA1C:2 in the last 72 hours Lipid Profile No results found for this basename: CHOL:2,HDL:2,LDLCALC:2,TRIG:2,CHOLHDL:2,LDLDIRECT:2 in the last 72 hours Thyroid function studies No results found for this basename: TSH,T4TOTAL,FREET3,T3FREE,THYROIDAB in the last 72 hours Anemia work up No results found for this basename: VITAMINB12:2,FOLATE:2,FERRITIN:2,TIBC:2,IRON:2,RETICCTPCT:2 in the last 72 hours Microbiology Recent Results (from the past 240 hour(s))  CLOSTRIDIUM DIFFICILE BY PCR     Status: Normal   Collection Time   12/06/12  2:30 AM  Component Value Range Status Comment   C difficile by pcr NEGATIVE  NEGATIVE Final    Rotovirus negative   Studies:  No results found.  Assessment: 66 y.o. [place] man with a history of well-differentiated lymphocytic lymphoma/ chronic lymphoid leukemia initially diagnosed in 2000, not requiring intervention until 2006; with multiple chromosomal abnormalities; s/p allogeneic transplant March 2013 at "[facility]", now admitted with GVHD.  His treatment history is as follows:   (1)  fludarabine/cyclophosphamide/rituximab x5 completed May 2007.  (2) rituximab for 8 doses October 2010, with partial response  (3) Leustatin and ofatumumab weekly x8 July to September 2011 followed by maintenance ofatumumab maintenance ofatumumab every 2 months, with initial response but rising counts September 2012  (4) status-post unrelated donor stem-cell transplant 02/24/2012 [facility]  (a) conditioning regimen consisted of fludarabine + TBI at 200 cGy, followed by rituximab x27;  (b) CMV reactivation x3 (patient CMV positive, donor negative), s/p ganciclovir treatment; 3d reactivation August 2013, s/p gancyclovir, with negative PCR mid-September 2013; last gancyclovir dose 10/06/2012  (c) GVHD: involving gut and skin, treated with steroids, tacrolimus and MMF initially, MMF eventually d/c'd and tacrolimus tapered (d) atrial fibrillation: resolved on amiodarone  (e) steroid-induced myopathy: improving until current crisis (f) hypomagnesemia: resolved with discontinuation of gancyclovir (g) hypogammaglobulinemia: s/p IVIG most recently 12/03/2012 (h) elevated triglycerides (606 on 07/14/2012)  (i) adrenal insufficiency: on prednisone  (j) pancytopenia; off growth factors (5) restaging studies September 2013 including CT scans, flow cytometry, and bone marrow biopsy, showed no evidence of residual chronic lymphoid leukemia.  (6) now with a skin rash, mouth changes, severe diarrhea and gastric/duodenal/colonic biopsies 11/17/2012 c/w GVHD grade 2, now down to grade 1 (BMs better formed, 2-3/24h, no rash, no chronic skin changes) (7) severe malnutrition (8) pancytopenia--stable (9) testosterone deficiency--being supplemented (10) deconditioning: REHAB planned  Plan: Timothy [name] is improving. I think with 2-3 weeks of intensive rehab he will be able to return home; if he is transferred to Rehab I will continue to follow on a QOD basis (will review chart daily). If not cleared for rehab here will  consider d/c to outside rehab facility  Lyra Alaimo C 12/08/2012

## 2012-12-08 NOTE — Progress Notes (Signed)
Admitting patient to CIR today. Dr Darnelle Catalan reports pt is medically stable for d/c to CIR. Pt's RN and CM aware. For questions call [phone].

## 2012-12-08 NOTE — Progress Notes (Signed)
Physical Therapy Treatment Patient Details Name: Timothy [name] MRN: [id number] DOB: [birth year] Today's Date: 12/08/2012 Time: 1478-2956 PT Time Calculation (min): 17 min  PT Assessment / Plan / Recommendation Comments on Treatment Session  pt continues with LE weakness, edema and decrease in functional status. He will benefit from continued PT at [facility] with varied strenthening activites to increase his independence. Next treatment to focus on proximal with one legged stand, etc.  Practice step ups with 3 inch step    Follow Up Recommendations  CIR     Does the patient have the potential to tolerate intense rehabilitation     Barriers to Discharge        Equipment Recommendations  Rolling walker with 5" wheels    Recommendations for Other Services    Frequency Min 3X/week   Plan Discharge plan remains appropriate;Frequency remains appropriate    Precautions / Restrictions Precautions Precautions: Fall Restrictions Weight Bearing Restrictions: No   Pertinent Vitals/Pain Pt c/o back pain     Mobility  Bed Mobility Bed Mobility: Supine to Sit Supine to Sit: 4: Min assist;With rails;HOB elevated Transfers Sit to Stand: 4: Min guard;From bed;From chair/3-in-1 Stand to Sit: 4: Min guard Details for Transfer Assistance: verbal cues to push up with arms from bed Ambulation/Gait Ambulation/Gait Assistance: 4: Min assist Ambulation Distance (Feet): 5 Feet Assistive device: 2 person hand held assist Ambulation/Gait Assistance Details: Pt assisted to get to a 5 inch step placed in his room to assist with quad strengthening. Gait Pattern: Step-through pattern General Gait Details: pt needed more assist after practicing step.  Pt dyspneic with exertion Stairs: Yes Stairs Assistance: 1: +2 Total assist Stairs Assistance Details (indicate cue type and reason): +2 hand hold assist to practice up and down one step with 3 repetitions.  Pt had difficulty raising body weight up onto step   and was dyspneic with the exertion.  Stair Management Technique: Forwards Number of Stairs: 3  Wheelchair Mobility Wheelchair Mobility: No    Exercises Other Exercises Other Exercises: discussed eccentric quad activation with slow contolled stand to sit   PT Diagnosis:    PT Problem List:   PT Treatment Interventions:     PT Goals Acute Rehab PT Goals PT Goal Formulation: With patient Time For Goal Achievement: 12/21/12 Potential to Achieve Goals: Good Pt will go Supine/Side to Sit: Independently PT Goal: Supine/Side to Sit - Progress: Progressing toward goal Pt will go Sit to Supine/Side: Independently Pt will go Sit to Stand: with modified independence PT Goal: Sit to Stand - Progress: Progressing toward goal Pt will go Stand to Sit: with modified independence PT Goal: Stand to Sit - Progress: Progressing toward goal Pt will Ambulate: >150 feet;with modified independence;with least restrictive assistive device Pt will Go Up / Down Stairs: 6-9 stairs;with rail(s);with supervision PT Goal: Up/Down Stairs - Progress: Progressing toward goal  Visit Information  Last PT Received On: 12/08/12 Assistance Needed: +1    Subjective Data  Subjective:  "My back hurts from laying in bed so long" Patient Stated Goal: to go to CIR   Cognition  Overall Cognitive Status: Appears within functional limits for tasks assessed/performed Arousal/Alertness: Awake/alert Orientation Level: Oriented X4 / Intact Behavior During Session: [facility] for tasks performed    Balance     End of Session PT - End of Session Activity Tolerance: Other (comment) (pt needed to use the [facility] ) Patient left: with call bell/phone within reach;Other (comment) (on [facility]) Nurse Communication: Other (comment) (pt will need assist  shortly)   GP     Bayard Hugger. [place], [place] [phone] 12/08/2012, 11:06 AM

## 2012-12-08 NOTE — PMR Pre-admission (Signed)
PMR Admission Coordinator Pre-Admission Assessment  Patient: Timothy [name] is an 66 y.o., male MRN: [id number] DOB: [birth year] Height: 5\' 8"  (172.7 cm) Weight: 84 kg (185 lb 3 oz)             Insurance Information  PPO: yes     PRIMARY: [place] BCBS      Policy#:[id number]      Subscriber: yes CM Name: Marylu Lund      Phone#: [phone]     Fax#: [phone] Pre-Cert#: [id number]      Employer: [employer]  Benefits:  Phone #: [phone]     Name: Alric Quan. Date: 06/21/12     Deduct: $350      Out of Pocket Max: [phone] (met)   Life Max: none  Need authorization for all: CIR: $233 copay per admission, 80%/20%      SNF: 80%/20% Outpatient: 100 visits/sepatarate PT,OT,ST     Co-Pay: $52/none Home Health: no limits 80/20% DME: 80/20% Providers: in network SECONDARY: Medicare      Policy#: [id number]      Subscriber: self  Emergency Contact Information Contact Information    Name Relation Home Work Mobile   [place] Spouse [phone]  [phone]   Smith,Michelle Daughter [phone]  [phone]     Current Medical History  Patient Admitting Diagnosis: Graft vs Host Disease, CLL recent diarrhea and FTT with subsequent deconditioning   History of Present Illness:66 y.o. male with CLL s/p allogenic stem cell transplant, reactivation of CMV who has had ongoing problems with diarrhea and weight loss. Recent work up consistent with graft v/s host disease as well as active gastritis and mild distal esophagitis. He was readmitted on 12/05/12 with recurrent diarrhea. He treated with hydration and short course of solu-medrol.     Past Medical History  Past Medical History  Diagnosis Date  . 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 Diverticular disease   . Hyperlipidemia   . Obesity   . Hypertension   . Hiatal hernia   . CMV (cytomegalovirus) antibody positive     pre-transplant, with seroconversion x2 pst-transplant  . Right bundle branch block      pre-transplant  . CKD (chronic kidney disease) stage 2, GFR 60-89 ml/min   . Pancytopenia   . Steroid-induced diabetes   . Atrial fibrillation     post-transplant  . Myopathy   . 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 Leukemia, chronic lymphoid   . 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 Diarrhea in adult patient 12/05/2012    Due to active GVHD  . CLL (chronic lymphocytic leukemia) 12/05/2012    Dx 07/1999; started Rx 12/06  AlloBMT 3/13  . Rash of face 12/05/2012    Due to GVHD   Family History  family history includes Cancer in his father.  Prior Rehab/Hospitalizations: 12 days in CIR in [place] in Apr,'13 after bone marrow transplant  Current Medications  Current facility-administered medications:acetaminophen (TYLENOL) suppository 650 mg, 650 mg, Rectal, Q6H PRN, Levert Feinstein, MD;  acetaminophen (TYLENOL) tablet 650 mg, 650 mg, Oral, Q6H PRN, Levert Feinstein, MD;  acyclovir (ZOVIRAX) tablet 400 mg, 400 mg, Oral, BID, Levert Feinstein, MD, 400 mg at 12/08/12 1115;  antiseptic oral rinse (BIOTENE) solution 15 mL, 15 mL, Mouth Rinse, q12n4p, Levert Feinstein, MD, 15 mL at 12/07/12 1625 budesonide (ENTOCORT EC) 24 hr capsule 9 mg, 9 mg, Oral, Daily, Lowella Dell,  MD, 9 mg at 12/08/12 1115;  diphenoxylate-atropine (LOMOTIL) 2.5-0.025 MG per tablet 1 tablet, 1 tablet, Oral, Q2H PRN, Levert Feinstein, MD, 1 tablet at 12/08/12 0857;  enoxaparin (LOVENOX) injection 40 mg, 40 mg, Subcutaneous, Q24H, Levert Feinstein, MD, 40 mg at 12/07/12 2257 feeding supplement (VITAL 1.5 CAL) liquid 237 mL, 237 mL, Oral, BID, Lavena Bullion, RD, 237 mL at 12/08/12 1114;  fluconazole (DIFLUCAN) tablet 100 mg, 100 mg, Oral, q morning - 10a, Levert Feinstein, MD, 100 mg at 12/08/12 1115;  levofloxacin (LEVAQUIN) tablet 500 mg, 500 mg, Oral, Daily, Lowella Dell, MD, 500 mg at 12/08/12 1115;  loperamide  (IMODIUM) capsule 2 mg, 2 mg, Oral, BID PRN, Lowella Dell, MD meclizine (ANTIVERT) tablet 25 mg, 25 mg, Oral, TID PRN, Levert Feinstein, MD;  ondansetron (ZOFRAN-ODT) disintegrating tablet 8 mg, 8 mg, Oral, Q8H PRN, Levert Feinstein, MD, 8 mg at 12/06/12 1205;  pantoprazole (PROTONIX) EC tablet 40 mg, 40 mg, Oral, Daily, Levert Feinstein, MD, 40 mg at 12/08/12 1115;  predniSONE (DELTASONE) tablet 10 mg, 10 mg, Oral, Q breakfast, Lowella Dell, MD, 10 mg at 12/08/12 0848 prochlorperazine (COMPAZINE) tablet 10 mg, 10 mg, Oral, Q6H PRN, Levert Feinstein, MD, 10 mg at 12/07/12 1235;  sertraline (ZOLOFT) tablet 100 mg, 100 mg, Oral, QODAY, John-Adam Bonk, MD, 100 mg at 12/07/12 1135;  sertraline (ZOLOFT) tablet 50 mg, 50 mg, Oral, QODAY, John-Adam Bonk, MD, 50 mg at 12/08/12 1115 sodium chloride 0.9 % 1,000 mL with potassium chloride 30 mEq infusion, , Intravenous, Continuous, Lowella Dell, MD, Last Rate: 50 mL/hr at 12/08/12 1610;  sulfamethoxazole-trimethoprim (BACTRIM DS) 800-160 MG per tablet 1 tablet, 1 tablet, Oral, 2 times weekly, Levert Feinstein, MD, 1 tablet at 12/06/12 1022;  tacrolimus (PROGRAF) capsule 1.5 mg, 1.5 mg, Oral, BID, Levert Feinstein, MD, 1.5 mg at 12/08/12 1115 testosterone (ANDRODERM) 4 MG/24HR patch 1 patch, 1 patch, Transdermal, Daily, Lowella Dell, MD, 1 patch at 12/07/12 1628 Facility-Administered Medications Ordered in Other Encounters: sodium chloride 0.9 % injection 10 mL, 10 mL, Intravenous, PRN, Lowella Dell, MD, 10 mL at 08/11/12 1606  Patients Current Diet: General  Precautions / Restrictions Precautions Precautions: Fall Restrictions Weight Bearing Restrictions: No   Prior Activity Level   Home Assistive Devices / Equipment Home Assistive Devices/Equipment: Environmental consultant (specify type) Home Adaptive Equipment: Shower chair with back  Prior Functional Level Prior Function Level of Independence: Independent with assistive  device(s) Able to Take Stairs?: Yes Driving: Yes Vocation: Full time employment Comments: Administrator, arts  Current Functional Level Cognition  Arousal/Alertness: Awake/alert Overall Cognitive Status: Appears within functional limits for tasks assessed/performed Orientation Level: Oriented X4    Extremity Assessment (includes Sensation/Coordination)  RUE ROM/Strength/Tone: Within functional levels (bil tremor; hand grip 4-/5 bil) RUE Coordination:  (pt reports using red theraputty given)  RLE ROM/Strength/Tone: Deficits RLE ROM/Strength/Tone Deficits: pt with pitting edema in lower legs.  Diffuse muscle atophy    ADLs  Grooming: Set up;Wash/dry hands;Wash/dry face;Performed Where Assessed - Grooming: Unsupported sitting Upper Body Bathing: Performed;Set up Where Assessed - Upper Body Bathing: Unsupported sitting Lower Body Bathing: Performed;Minimal assistance Where Assessed - Lower Body Bathing: Supported sit to stand Upper Body Dressing: Performed;Minimal assistance Where Assessed - Upper Body Dressing: Unsupported sitting Lower Body Dressing: Performed;Maximal assistance Where Assessed - Lower Body Dressing: Unsupported sit to stand Toilet Transfer: Performed;Min guard Toilet Transfer Method: Surveyor, minerals: Materials engineer  and Hygiene: Performed;Min guard Where Assessed - Toileting Clothing Manipulation and Hygiene: Sit to stand from 3-in-1 or toilet Transfers/Ambulation Related to ADLs: spt only ADL Comments: Pt has swelling in bil feet/legs.  Difficulty lifting them and donning socks    Mobility  Bed Mobility: Supine to Sit Supine to Sit: 4: Min assist;With rails;HOB elevated Sit to Supine: 4: Min assist    Transfers  Sit to Stand: 4: Min guard;From bed;From chair/3-in-1 Stand to Sit: 4: Min guard    Ambulation / Gait / Stairs / Psychologist, prison and probation services  Ambulation/Gait Ambulation/Gait Assistance: 4: Min  Environmental consultant (Feet): 5 Feet Assistive device: 2 person hand held assist Ambulation/Gait Assistance Details: Pt assisted to get to a 5 inch step placed in his room to assist with quad strengthening. Gait Pattern: Step-through pattern General Gait Details: pt needed more assist after practicing step.  Pt dyspneic with exertion Stairs: Yes Stairs Assistance: 1: +2 Total assist Stairs Assistance Details (indicate cue type and reason): +2 hand hold assist to practice up and down one step with 3 repetitions.  Pt had difficulty raising body weight up onto step  and was dyspneic with the exertion.  Stair Management Technique: Forwards Number of Stairs: 3  Wheelchair Mobility Wheelchair Mobility: No    Posture / Games developer Sitting - Balance Support: No upper extremity supported;Feet supported Static Sitting - Comment/# of Minutes: > 3 minutes,  Pt encouraged to have trunk extension Static Standing Balance Static Standing - Balance Support: Bilateral upper extremity supported Static Standing - Level of Assistance: 5: Stand by assistance Static Standing - Comment/# of Minutes: pt uses RW for support    Special needs/care consideration BiPAP/CPAP  -N/A CPM -N/A Continuous Drip IV:  yes Dialysis - -N/A        Days -N/A Life Vest -N/A Oxygen -N/A Special Be -N/Ad -N/A Trach Size -N/A Wound Vac (area) -N/A     Skin: L elbow abrasions                               Bowel mgmt: having diarrhea, incont at times Bladder mgmt:-WNL Diabetic mgmt   N/A   Previous Home Environment Living Arrangements: Spouse/significant other Lives With: Spouse Available Help at Discharge: Family Type of Home: House Home Layout: Two level;Bed/bath upstairs Alternate Level Stairs-Rails: Right;Left Alternate Level Stairs-Number of Steps: 10 Home Access: Stairs to enter Entrance Stairs-Rails: Right Entrance Stairs-Number of Steps: 2 Bathroom Shower/Tub: Architectural technologist: Standard Home Care Services: No  Discharge Living Setting Plans for Discharge Living Setting: Patient's home Type of Home at Discharge: House Discharge Home Layout: Two level Alternate Level Stairs-Rails: Can reach both Alternate Level Stairs-Number of Steps: 10-14 Discharge Home Access: Stairs to enter Entrance Stairs-Rails: Left Entrance Stairs-Number of Steps: 3 Discharge Bathroom Shower/Tub: Walk-in shower Discharge Bathroom Toilet: Standard Do you have any problems obtaining your medications?: No  Social/Family/Support Systems Patient Roles: Spouse;Parent, Daughter lives in [place] Information: [phone] Anticipated Caregiver: Wife Anticipated Caregiver's Contact Information: Chayden Garrelts [phone] Ability/Limitations of Caregiver: min-mod A Caregiver Availability: 24/7 Discharge Plan Discussed with Primary Caregiver: Yes Is Caregiver In Agreement with Plan?: Yes Does Caregiver/Family have Issues with Lodging/Transportation while Pt is in Rehab?: No  Goals/Additional Needs Patient/Family Goal for Rehab: PT: S, OT min A-S Expected length of stay: 7-10 days Cultural Considerations: none Dietary Needs: regular Equipment Needs: TBD  Decrease burden of Care through IP rehab  admission: N/A  Possible need for SNF placement upon discharge: No  Patient Condition: This patient's condition remains as documented in the Consult dated 12/08/12, in which the Rehabilitation Physician determined and documented that the patient's condition is appropriate for intensive rehabilitative care in an inpatient rehabilitation facility.  Preadmission Screen Completed By:  Meryl Dare, 12/08/2012 12:38 PM ______________________________________________________________________   Discussed status with Dr. Wynn Banker on 12/09/12 at 8:20 AM and received telephone approval for admission today.  Admission Coordinator:  Meryl Dare, time  8:20 AM Dorna Bloom 12/09/12

## 2012-12-08 NOTE — Progress Notes (Signed)
Received call from pt's CM reporting that Dr Darnelle Catalan has decided to keep pt overnight on acute and D/C to CIR in AM.  For questions: [phone]

## 2012-12-09 ENCOUNTER — Encounter (HOSPITAL_COMMUNITY): Payer: Self-pay | Admitting: *Deleted

## 2012-12-09 ENCOUNTER — Inpatient Hospital Stay (HOSPITAL_COMMUNITY)
Admission: RE | Admit: 2012-12-09 | Discharge: 2012-12-29 | DRG: 462 | Disposition: A | Discharge status: Home Health | Payer: BC Managed Care – PPO | Source: Ambulatory Visit | Attending: Physical Medicine & Rehabilitation | Admitting: Physical Medicine & Rehabilitation

## 2012-12-09 DIAGNOSIS — C9110 Chronic lymphocytic leukemia of B-cell type not having achieved remission: Secondary | ICD-10-CM | POA: Diagnosis present

## 2012-12-09 DIAGNOSIS — K449 Diaphragmatic hernia without obstruction or gangrene: Secondary | ICD-10-CM

## 2012-12-09 DIAGNOSIS — I129 Hypertensive chronic kidney disease with stage 1 through stage 4 chronic kidney disease, or unspecified chronic kidney disease: Secondary | ICD-10-CM | POA: Diagnosis present

## 2012-12-09 DIAGNOSIS — T8600 Unspecified complication of bone marrow transplant: Secondary | ICD-10-CM | POA: Diagnosis present

## 2012-12-09 DIAGNOSIS — R42 Dizziness and giddiness: Secondary | ICD-10-CM

## 2012-12-09 DIAGNOSIS — T380X5A Adverse effect of glucocorticoids and synthetic analogues, initial encounter: Secondary | ICD-10-CM | POA: Diagnosis present

## 2012-12-09 DIAGNOSIS — R5381 Other malaise: Secondary | ICD-10-CM

## 2012-12-09 DIAGNOSIS — R894 Abnormal immunological findings in specimens from other organs, systems and tissues: Secondary | ICD-10-CM | POA: Diagnosis present

## 2012-12-09 DIAGNOSIS — I4891 Unspecified atrial fibrillation: Secondary | ICD-10-CM | POA: Diagnosis present

## 2012-12-09 DIAGNOSIS — M549 Dorsalgia, unspecified: Secondary | ICD-10-CM

## 2012-12-09 DIAGNOSIS — M810 Age-related osteoporosis without current pathological fracture: Secondary | ICD-10-CM | POA: Diagnosis present

## 2012-12-09 DIAGNOSIS — Y834 Other reconstructive surgery as the cause of abnormal reaction of the patient, or of later complication, without mention of misadventure at the time of the procedure: Secondary | ICD-10-CM | POA: Diagnosis present

## 2012-12-09 DIAGNOSIS — E669 Obesity, unspecified: Secondary | ICD-10-CM | POA: Diagnosis present

## 2012-12-09 DIAGNOSIS — D849 Immunodeficiency, unspecified: Secondary | ICD-10-CM

## 2012-12-09 DIAGNOSIS — R768 Other specified abnormal immunological findings in serum: Secondary | ICD-10-CM | POA: Diagnosis present

## 2012-12-09 DIAGNOSIS — N182 Chronic kidney disease, stage 2 (mild): Secondary | ICD-10-CM | POA: Diagnosis present

## 2012-12-09 DIAGNOSIS — G252 Other specified forms of tremor: Secondary | ICD-10-CM

## 2012-12-09 DIAGNOSIS — E139 Other specified diabetes mellitus without complications: Secondary | ICD-10-CM | POA: Diagnosis present

## 2012-12-09 DIAGNOSIS — G8929 Other chronic pain: Secondary | ICD-10-CM

## 2012-12-09 DIAGNOSIS — R197 Diarrhea, unspecified: Secondary | ICD-10-CM

## 2012-12-09 DIAGNOSIS — B259 Cytomegaloviral disease, unspecified: Secondary | ICD-10-CM | POA: Diagnosis present

## 2012-12-09 DIAGNOSIS — E860 Dehydration: Secondary | ICD-10-CM | POA: Diagnosis present

## 2012-12-09 DIAGNOSIS — K5730 Diverticulosis of large intestine without perforation or abscess without bleeding: Secondary | ICD-10-CM | POA: Diagnosis present

## 2012-12-09 DIAGNOSIS — E785 Hyperlipidemia, unspecified: Secondary | ICD-10-CM | POA: Diagnosis present

## 2012-12-09 DIAGNOSIS — T8609 Other complications of bone marrow transplant: Secondary | ICD-10-CM | POA: Diagnosis present

## 2012-12-09 DIAGNOSIS — R21 Rash and other nonspecific skin eruption: Secondary | ICD-10-CM

## 2012-12-09 DIAGNOSIS — R112 Nausea with vomiting, unspecified: Secondary | ICD-10-CM

## 2012-12-09 DIAGNOSIS — E46 Unspecified protein-calorie malnutrition: Secondary | ICD-10-CM

## 2012-12-09 DIAGNOSIS — G729 Myopathy, unspecified: Secondary | ICD-10-CM

## 2012-12-09 DIAGNOSIS — D89811 Chronic graft-versus-host disease: Secondary | ICD-10-CM | POA: Diagnosis present

## 2012-12-09 DIAGNOSIS — K5790 Diverticulosis of intestine, part unspecified, without perforation or abscess without bleeding: Secondary | ICD-10-CM

## 2012-12-09 DIAGNOSIS — R630 Anorexia: Secondary | ICD-10-CM | POA: Diagnosis present

## 2012-12-09 DIAGNOSIS — Z5189 Encounter for other specified aftercare: Principal | ICD-10-CM

## 2012-12-09 DIAGNOSIS — D61818 Other pancytopenia: Secondary | ICD-10-CM

## 2012-12-09 DIAGNOSIS — E43 Unspecified severe protein-calorie malnutrition: Secondary | ICD-10-CM | POA: Diagnosis present

## 2012-12-09 DIAGNOSIS — Z9489 Other transplanted organ and tissue status: Secondary | ICD-10-CM

## 2012-12-09 DIAGNOSIS — I10 Essential (primary) hypertension: Secondary | ICD-10-CM

## 2012-12-09 LAB — CBC WITH DIFFERENTIAL/PLATELET
Basophils Absolute: 0 10*3/uL (ref 0.0–0.1)
Basophils Relative: 1 % (ref 0–1)
Eosinophils Absolute: 0 10*3/uL (ref 0.0–0.7)
Hemoglobin: 9.9 g/dL — ABNORMAL LOW (ref 13.0–17.0)
MCH: 29.7 pg (ref 26.0–34.0)
MCHC: 35.4 g/dL (ref 30.0–36.0)
Monocytes Absolute: 0.3 10*3/uL (ref 0.1–1.0)
Monocytes Relative: 8 % (ref 3–12)
Neutro Abs: 2.9 10*3/uL (ref 1.7–7.7)
Neutrophils Relative %: 65 % (ref 43–77)
RDW: 16.3 % — ABNORMAL HIGH (ref 11.5–15.5)

## 2012-12-09 LAB — CBC
Hemoglobin: 10.8 g/dL — ABNORMAL LOW (ref 13.0–17.0)
RBC: 3.59 MIL/uL — ABNORMAL LOW (ref 4.22–5.81)
WBC: 4.6 10*3/uL (ref 4.0–10.5)

## 2012-12-09 LAB — COMPREHENSIVE METABOLIC PANEL
AST: 13 U/L (ref 0–37)
Albumin: 1.3 g/dL — ABNORMAL LOW (ref 3.5–5.2)
BUN: 19 mg/dL (ref 6–23)
Creatinine, Ser: 1.03 mg/dL (ref 0.50–1.35)
Potassium: 4.8 mEq/L (ref 3.5–5.1)
Total Protein: 3.3 g/dL — ABNORMAL LOW (ref 6.0–8.3)

## 2012-12-09 MED ORDER — ALUM & MAG HYDROXIDE-SIMETH 200-200-20 MG/5ML PO SUSP: 30.0000 mL | ORAL | Status: DC | PRN

## 2012-12-09 MED ORDER — GUAIFENESIN-DM 100-10 MG/5ML PO SYRP: 5.0000 mL | ORAL_SOLUTION | Freq: Four times a day (QID) | ORAL | Status: DC | PRN

## 2012-12-09 MED ORDER — ACYCLOVIR 400 MG PO TABS
400.0000 mg | ORAL_TABLET | Freq: Two times a day (BID) | ORAL | Status: DC
  Filled 2012-12-09 (×2): qty 1

## 2012-12-09 MED ORDER — TACROLIMUS 1 MG PO CAPS
1.5000 mg | ORAL_CAPSULE | Freq: Two times a day (BID) | ORAL | Status: DC
  Administered 2012-12-09 – 2012-12-15 (×12): 1.5 mg via ORAL
  Filled 2012-12-09 (×15): qty 1

## 2012-12-09 MED ORDER — VITAL 1.5 CAL PO LIQD
237.0000 mL | Freq: Two times a day (BID) | ORAL | Status: DC
  Filled 2012-12-09 (×2): qty 237

## 2012-12-09 MED ORDER — BUDESONIDE 3 MG PO CP24
9.0000 mg | ORAL_CAPSULE | Freq: Every day | ORAL | Status: DC
  Administered 2012-12-10 – 2012-12-29 (×20): 9 mg via ORAL
  Filled 2012-12-09 (×24): qty 3

## 2012-12-09 MED ORDER — PROCHLORPERAZINE MALEATE 10 MG PO TABS
10.0000 mg | ORAL_TABLET | Freq: Four times a day (QID) | ORAL | Status: DC | PRN
  Administered 2012-12-10: 10 mg via ORAL
  Filled 2012-12-09 (×5): qty 1

## 2012-12-09 MED ORDER — PANTOPRAZOLE SODIUM 40 MG PO TBEC
40.0000 mg | DELAYED_RELEASE_TABLET | Freq: Every day | ORAL | Status: DC
  Administered 2012-12-10 – 2012-12-29 (×20): 40 mg via ORAL
  Filled 2012-12-09 (×22): qty 1

## 2012-12-09 MED ORDER — MECLIZINE HCL 25 MG PO TABS
25.0000 mg | ORAL_TABLET | Freq: Three times a day (TID) | ORAL | Status: DC | PRN
  Filled 2012-12-09: qty 1

## 2012-12-09 MED ORDER — ONDANSETRON 8 MG PO TBDP
8.0000 mg | ORAL_TABLET | Freq: Three times a day (TID) | ORAL | Status: DC | PRN
  Administered 2012-12-12 – 2012-12-16 (×2): 8 mg via ORAL
  Filled 2012-12-09 (×5): qty 1

## 2012-12-09 MED ORDER — LABETALOL HCL 200 MG PO TABS
200.0000 mg | ORAL_TABLET | Freq: Two times a day (BID) | ORAL | Status: DC
  Administered 2012-12-09: 200 mg via ORAL
  Filled 2012-12-09 (×2): qty 1

## 2012-12-09 MED ORDER — NAPROXEN 500 MG PO TABS: ORAL_TABLET | ORAL | Status: DC

## 2012-12-09 MED ORDER — SERTRALINE HCL 100 MG PO TABS
100.0000 mg | ORAL_TABLET | ORAL | Status: DC
  Administered 2012-12-11 – 2012-12-29 (×10): 100 mg via ORAL
  Filled 2012-12-09 (×10): qty 1

## 2012-12-09 MED ORDER — SERTRALINE HCL 100 MG PO TABS: 100.0000 mg | ORAL_TABLET | ORAL | Status: DC

## 2012-12-09 MED ORDER — ACETAMINOPHEN 325 MG PO TABS: 325.0000 mg | ORAL_TABLET | ORAL | Status: DC | PRN

## 2012-12-09 MED ORDER — TESTOSTERONE 4 MG/24HR TD PT24
1.0000 | MEDICATED_PATCH | Freq: Every day | TRANSDERMAL | Status: DC
  Administered 2012-12-09 – 2012-12-28 (×20): 1 via TRANSDERMAL
  Filled 2012-12-09 (×21): qty 1

## 2012-12-09 MED ORDER — BIOTENE DRY MOUTH MT LIQD
15.0000 mL | Freq: Two times a day (BID) | OROMUCOSAL | Status: DC
  Administered 2012-12-11 – 2012-12-28 (×29): 15 mL via OROMUCOSAL

## 2012-12-09 MED ORDER — ACYCLOVIR 200 MG PO CAPS
400.0000 mg | ORAL_CAPSULE | Freq: Two times a day (BID) | ORAL | Status: DC
  Administered 2012-12-09 – 2012-12-25 (×32): 400 mg via ORAL
  Filled 2012-12-09 (×35): qty 2

## 2012-12-09 MED ORDER — LABETALOL HCL 100 MG PO TABS: 200.0000 mg | ORAL_TABLET | Freq: Two times a day (BID) | ORAL | Status: DC

## 2012-12-09 MED ORDER — ENOXAPARIN SODIUM 40 MG/0.4ML ~~LOC~~ SOLN
40.0000 mg | SUBCUTANEOUS | Status: DC
  Administered 2012-12-09 – 2012-12-28 (×20): 40 mg via SUBCUTANEOUS
  Filled 2012-12-09 (×21): qty 0.4

## 2012-12-09 MED ORDER — VITAL 1.5 CAL PO LIQD
237.0000 mL | Freq: Three times a day (TID) | ORAL | Status: DC
  Administered 2012-12-09 – 2012-12-29 (×68): 237 mL via ORAL
  Filled 2012-12-09 (×90): qty 237

## 2012-12-09 MED ORDER — NAPROXEN 250 MG PO TABS: ORAL_TABLET | ORAL | Status: DC

## 2012-12-09 MED ORDER — SULFAMETHOXAZOLE-TMP DS 800-160 MG PO TABS
1.0000 | ORAL_TABLET | ORAL | Status: DC
  Administered 2012-12-13 – 2012-12-27 (×5): 1 via ORAL
  Filled 2012-12-09 (×7): qty 1

## 2012-12-09 MED ORDER — SERTRALINE HCL 50 MG PO TABS
50.0000 mg | ORAL_TABLET | ORAL | Status: DC
  Administered 2012-12-10 – 2012-12-28 (×10): 50 mg via ORAL
  Filled 2012-12-09 (×10): qty 1

## 2012-12-09 MED ORDER — LABETALOL HCL 200 MG PO TABS: 200.0000 mg | ORAL_TABLET | Freq: Two times a day (BID) | ORAL | Status: DC

## 2012-12-09 MED ORDER — LOPERAMIDE HCL 2 MG PO CAPS
2.0000 mg | ORAL_CAPSULE | Freq: Two times a day (BID) | ORAL | Status: DC | PRN
  Administered 2012-12-11 – 2012-12-15 (×4): 2 mg via ORAL
  Filled 2012-12-09 (×7): qty 1

## 2012-12-09 MED ORDER — LABETALOL HCL 200 MG PO TABS
200.0000 mg | ORAL_TABLET | Freq: Two times a day (BID) | ORAL | Status: DC
  Administered 2012-12-09 – 2012-12-29 (×40): 200 mg via ORAL
  Filled 2012-12-09 (×43): qty 1

## 2012-12-09 MED ORDER — FLUCONAZOLE 100 MG PO TABS
100.0000 mg | ORAL_TABLET | Freq: Every morning | ORAL | Status: DC
  Administered 2012-12-10 – 2012-12-29 (×20): 100 mg via ORAL
  Filled 2012-12-09 (×20): qty 1

## 2012-12-09 MED ORDER — PREDNISONE 5 MG PO TABS
5.0000 mg | ORAL_TABLET | Freq: Every day | ORAL | Status: DC
  Administered 2012-12-10 – 2012-12-18 (×9): 5 mg via ORAL
  Filled 2012-12-09 (×10): qty 1

## 2012-12-09 MED ORDER — DIPHENOXYLATE-ATROPINE 2.5-0.025 MG PO TABS
1.0000 | ORAL_TABLET | ORAL | Status: DC | PRN
  Administered 2012-12-10 – 2012-12-22 (×25): 1 via ORAL
  Filled 2012-12-09 (×26): qty 1

## 2012-12-09 NOTE — H&P (Signed)
Physical Medicine and Rehabilitation Admission H&P  Timothy [name] is an 66 y.o. male.   HPI: Timothy [name] is a 67 y.o. male with CLL s/p allogenic stem cell transplant, reactivation of CMV who has had ongoing problems with diarrhea and weight loss. Recent work up consistent with graft v/s host disease as well as active gastritis and mild distal esophagitis. He was readmitted on 12/05/12 with recurrent diarrhea. He treated with hydration and short course of solu-medrol. OT evaluation done today and CIR recommended due to deconditioning.      Review of Systems  Constitutional: Positive for malaise/fatigue.  Gastrointestinal: Positive for nausea and diarrhea.  Musculoskeletal: Positive for back pain.       With movement  Neurological: Positive for weakness.  Endo/Heme/Allergies: Bruises/bleeds easily.  All other systems reviewed and are negative.   Past Medical History  Diagnosis Date  . 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 Diverticular disease   . Hyperlipidemia   . Obesity   . Hypertension   . Hiatal hernia   . CMV (cytomegalovirus) antibody positive     pre-transplant, with seroconversion x2 pst-transplant  . Right bundle branch block     pre-transplant  . CKD (chronic kidney disease) stage 2, GFR 60-89 ml/min   . Pancytopenia   . Steroid-induced diabetes   . Atrial fibrillation     post-transplant  . Myopathy   . 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 Leukemia, chronic lymphoid   . 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 Diarrhea in adult patient 12/05/2012    Due to active GVHD  . CLL (chronic lymphocytic leukemia) 12/05/2012    Dx 07/1999; started Rx 12/06  AlloBMT 3/13  . Rash of face 12/05/2012    Due to GVHD   Past Surgical History  Procedure Date  . Tonsillectomy and adenoidectomy   . Bone marrow transplant   . Flexible sigmoidoscopy 11/17/2012     Procedure: FLEXIBLE SIGMOIDOSCOPY;  Surgeon: Petra Kuba, MD;  Location: [facility];  Service: Endoscopy;  Laterality: N/A;  Dr Ewing Schlein states will be admitted to rooom 1339 11/16/12  . Esophagogastroduodenoscopy 11/17/2012    Procedure: ESOPHAGOGASTRODUODENOSCOPY (EGD);  Surgeon: Petra Kuba, MD;  Location: [facility];  Service: Endoscopy;  Laterality: N/A;   Family History  Problem Relation Age of Onset  . Cancer Father    Social History:  reports that he has never smoked. He has never used smokeless tobacco. He reports that he does not drink alcohol or use illicit drugs. Allergies:  Allergies  Allergen Reactions  . Benadryl (Diphenhydramine Hcl)     "Restless leg syndrome"   Medications Prior to Admission  Medication Sig Dispense Refill  . acetaminophen (TYLENOL) 325 MG tablet Take 2 tablets (650 mg total) by mouth every 6 (six) hours as needed (or Fever >/= 101).  60 tablet  12  . acyclovir (ZOVIRAX) 400 MG tablet Take 1 tablet (400 mg total) by mouth 2 (two) times daily.  60 tablet  2  . antiseptic oral rinse (BIOTENE) LIQD 15 mLs by Mouth Rinse route 2 times daily at 12 noon and 4 pm.  237 mL  6  . aspirin 81 MG chewable tablet Chew 1 tablet (81 mg total) by mouth daily.  30 tablet  6  . budesonide (ENTOCORT EC) 3 MG 24 hr capsule Take 3 capsules (9 mg total) by mouth every morning.  90  capsule  6  . Calcium Citrate (CITRACAL PO) Take 1,200 mg by mouth 2 (two) times daily.      . cholecalciferol (VITAMIN D) 1000 UNITS tablet Take 1,000 Units by mouth every evening.       . diphenoxylate-atropine (LOMOTIL) 2.5-0.025 MG per tablet Take 1 to 2 tablets as needed for diarrhea, maximum 6 per day  60 tablet  3  . enoxaparin (LOVENOX) 40 MG/0.4ML injection Inject 0.4 mLs (40 mg total) into the skin daily.  40 mL  30  . fluconazole (DIFLUCAN) 100 MG tablet Take 100 mg by mouth every morning.      . Heparin Lock Flush (HEPARIN FLUSH, PORCINE,) 100 UNIT/ML injection Inject 3 mLs into the  vein every evening. Through chest      . labetalol (NORMODYNE) 100 MG tablet Take 2 tablets (200 mg total) by mouth 2 (two) times daily.  60 tablet  12  . labetalol (NORMODYNE) 200 MG tablet Take 100 mg by mouth 2 (two) times daily.       Marland Kitchen labetalol (NORMODYNE) 200 MG tablet Take 1 tablet (200 mg total) by mouth 2 (two) times daily.  60 tablet  12  . lisinopril (PRINIVIL,ZESTRIL) 10 MG tablet Take 5 mg by mouth at bedtime.      Marland Kitchen loperamide (IMODIUM) 2 MG capsule Take 1 capsule (2 mg total) by mouth as needed for diarrhea or loose stools (Given with each loose stool not to exceed 4 tablets daily).  30 capsule  6  . meclizine (ANTIVERT) 25 MG tablet Take 25 mg by mouth Twice daily as needed. For dizziness      . Multiple Vitamins-Minerals (CENTRUM SILVER ADULT 50+) TABS Take 1 tablet by mouth every evening.       . naproxen (NAPROSYN) 500 MG tablet Once a day with breakfast, then Q6h PRN with food  30 tablet  12  . Nutritional Supplements (FEEDING SUPPLEMENT, VITAL 1.5 CAL,) LIQD Take 237 mLs by mouth 2 (two) times daily.  60 Can  6  . Nutritional Supplements (FEEDING SUPPLEMENT, VITAL 1.5 CAL,) LIQD Take 237 mLs by mouth 2 (two) times daily.  60 Can  12  . Omega-3 Fatty Acids (FISH OIL) 300 MG CAPS Take 600 mg by mouth every morning.       . ondansetron (ZOFRAN-ODT) 8 MG disintegrating tablet Take 8 mg by mouth 2 (two) times daily as needed. For nausea      . pantoprazole (PROTONIX) 40 MG tablet Take 1 tablet (40 mg total) by mouth daily.  30 tablet  12  . predniSONE (DELTASONE) 10 MG tablet Take 5 mg by mouth daily with breakfast.      . prochlorperazine (COMPAZINE) 10 MG tablet Take 10 mg by mouth every 6 (six) hours as needed. Nausea      . sertraline (ZOLOFT) 100 MG tablet Take 1 tablet (100 mg total) by mouth every other day.  15 tablet  12  . sertraline (ZOLOFT) 50 MG tablet Take 50-100 mg by mouth daily. Takes 50 mg one day and 100 mg the next day      . sulfamethoxazole-trimethoprim  (BACTRIM DS) 800-160 MG per tablet Take 1 tablet by mouth 2 (two) times a week. Mondays and Tuesdays, twice daily      . tacrolimus (PROGRAF) 0.5 MG capsule Take 1.5 mg by mouth 2 (two) times daily.      Marland Kitchen testosterone (ANDRODERM) 4 MG/24HR PT24 patch Place 1 patch onto the skin daily.  30 patch  3    Home:     Functional History:    Functional Status:  Mobility:          ADL:    Cognition: Cognition Orientation Level: Oriented X4     Blood pressure 126/85, pulse 69, temperature 98.2 F (36.8 C), temperature source Oral, resp. rate 20, height 5\' 7"  (1.702 m), weight 84.3 kg (185 lb 13.6 oz), SpO2 94.00%. Physical Exam  Nursing note and vitals reviewed. Constitutional: He is oriented to person, place, and time. He appears well-developed.       Chronically ill appearing  HENT:  Head: Normocephalic and atraumatic.  Right Ear: External ear normal.  Left Ear: External ear normal.  Nose: Nose normal.  Mouth/Throat: Oropharynx is clear and moist.  Eyes: Conjunctivae normal and EOM are normal. Pupils are equal, round, and reactive to light.  Neck: Normal range of motion. Neck supple.  Cardiovascular: Normal rate, regular rhythm, normal heart sounds and intact distal pulses.   Respiratory: Effort normal and breath sounds normal. No respiratory distress. He has no wheezes. He has no rales. He exhibits no tenderness.  GI: Soft. Bowel sounds are normal. He exhibits no distension. There is tenderness. There is no rebound and no guarding.  Genitourinary: Penis normal.  Musculoskeletal: Normal range of motion.       2+ edema bilateral pretibial 3+ bilateral feet  Neurological: He is alert and oriented to person, place, and time. He has normal reflexes. He displays no tremor. He exhibits normal muscle tone. Gait abnormal.       Motor strength is 4/5 in bilateral deltoid, biceps, triceps, grip 3 minus/5 in bilateral hip flexors knee extensors 4/5 bilateral ankle dorsiflexor plantar  flexor some pain in addition with proximal leg movements  Skin: Skin is warm and dry.  Psychiatric: He has a normal mood and affect. His behavior is normal. Judgment and thought content normal.    Results for orders placed during the hospital encounter of 12/05/12 (from the past 48 hour(s))  CBC WITH DIFFERENTIAL     Status: Abnormal   Collection Time   12/08/12  5:50 AM      Component Value Range Comment   WBC 7.3  4.0 - 10.5 K/uL    RBC 3.24 (*) 4.22 - 5.81 MIL/uL    Hemoglobin 9.8 (*) 13.0 - 17.0 g/dL    HCT 95.6 (*) 21.3 - 52.0 %    MCV 84.6  78.0 - 100.0 fL    MCH 30.2  26.0 - 34.0 pg    MCHC 35.8  30.0 - 36.0 g/dL    RDW 08.6 (*) 57.8 - 15.5 %    Platelets 104 (*) 150 - 400 K/uL CONSISTENT WITH PREVIOUS RESULT   Neutrophils Relative 79 (*) 43 - 77 %    Lymphocytes Relative 14  12 - 46 %    Monocytes Relative 7  3 - 12 %    Eosinophils Relative 0  0 - 5 %    Basophils Relative 0  0 - 1 %    Neutro Abs 5.8  1.7 - 7.7 K/uL    Lymphs Abs 1.0  0.7 - 4.0 K/uL    Monocytes Absolute 0.5  0.1 - 1.0 K/uL    Eosinophils Absolute 0.0  0.0 - 0.7 K/uL    Basophils Absolute 0.0  0.0 - 0.1 K/uL    WBC Morphology INCREASED BANDS (>20% BANDS)   TOXIC GRANULATION  COMPREHENSIVE METABOLIC PANEL     Status: Abnormal  Collection Time   12/08/12  5:50 AM      Component Value Range Comment   Sodium 130 (*) 135 - 145 mEq/L    Potassium 4.5  3.5 - 5.1 mEq/L    Chloride 106  96 - 112 mEq/L    CO2 19  19 - 32 mEq/L    Glucose, Bld 80  70 - 99 mg/dL    BUN 22  6 - 23 mg/dL    Creatinine, Ser 1.61  0.50 - 1.35 mg/dL    Calcium 7.2 (*) 8.4 - 10.5 mg/dL    Total Protein 3.5 (*) 6.0 - 8.3 g/dL    Albumin 1.4 (*) 3.5 - 5.2 g/dL    AST 12  0 - 37 U/L    ALT 12  0 - 53 U/L    Alkaline Phosphatase 62  39 - 117 U/L    Total Bilirubin 0.2 (*) 0.3 - 1.2 mg/dL    GFR calc non Af Amer 73 (*) >90 mL/min    GFR calc Af Amer 84 (*) >90 mL/min   TACROLIMUS, BLOOD     Status: Normal   Collection Time    12/08/12  9:50 AM      Component Value Range Comment   Tacrolimus Lvl 7.5  5.0 - 20.0 ng/mL   CBC WITH DIFFERENTIAL     Status: Abnormal   Collection Time   12/09/12  5:45 AM      Component Value Range Comment   WBC 4.4  4.0 - 10.5 K/uL    RBC 3.33 (*) 4.22 - 5.81 MIL/uL    Hemoglobin 9.9 (*) 13.0 - 17.0 g/dL    HCT 09.6 (*) 04.5 - 52.0 %    MCV 84.1  78.0 - 100.0 fL    MCH 29.7  26.0 - 34.0 pg    MCHC 35.4  30.0 - 36.0 g/dL    RDW 40.9 (*) 81.1 - 15.5 %    Platelets 86 (*) 150 - 400 K/uL CONSISTENT WITH PREVIOUS RESULT   Neutrophils Relative 65  43 - 77 %    Neutro Abs 2.9  1.7 - 7.7 K/uL    Lymphocytes Relative 27  12 - 46 %    Lymphs Abs 1.2  0.7 - 4.0 K/uL    Monocytes Relative 8  3 - 12 %    Monocytes Absolute 0.3  0.1 - 1.0 K/uL    Eosinophils Relative 0  0 - 5 %    Eosinophils Absolute 0.0  0.0 - 0.7 K/uL    Basophils Relative 1  0 - 1 %    Basophils Absolute 0.0  0.0 - 0.1 K/uL   COMPREHENSIVE METABOLIC PANEL     Status: Abnormal   Collection Time   12/09/12  5:45 AM      Component Value Range Comment   Sodium 132 (*) 135 - 145 mEq/L    Potassium 4.8  3.5 - 5.1 mEq/L    Chloride 109  96 - 112 mEq/L    CO2 19  19 - 32 mEq/L    Glucose, Bld 77  70 - 99 mg/dL    BUN 19  6 - 23 mg/dL    Creatinine, Ser 9.14  0.50 - 1.35 mg/dL    Calcium 6.7 (*) 8.4 - 10.5 mg/dL    Total Protein 3.3 (*) 6.0 - 8.3 g/dL    Albumin 1.3 (*) 3.5 - 5.2 g/dL    AST 13  0 - 37 U/L  ALT 11  0 - 53 U/L    Alkaline Phosphatase 63  39 - 117 U/L    Total Bilirubin 0.2 (*) 0.3 - 1.2 mg/dL    GFR calc non Af Amer 74 (*) >90 mL/min    GFR calc Af Amer 85 (*) >90 mL/min    No results found.  Assessment/Plan: 1. Functional deficits secondary  to Deconditioning, graft versus host reaction, CLL status post bone marrow transplant. 2. Patient admitted to receive collaborative, interdisciplinary care between the psychiatrist, rehab nursing staff, and therapy team. 3. Patient's level of medical  complexity and substantial therapy needs in context of that medical necessity cannot be provided at a lesser intensity of care. 4. Patient has experienced substantial functional loss form his/her baseline. Upon functional assessment at the time of the preadmission screening, patient was Min assist with a walker ambulating 300 feet, min to supervision upper body ADLs and Max assist lower body ADLs.  Upon functional evaluation today, patient was Same as above.  Judging by the patient's diagnosis, physical exam, and functional history, the patient has the potential for functional progress OR the patient as displayed the ability to make functional progress which will result in measurable gains while on inpatient rehab.  These gains will be of substantial and practical use upon discharge in facilitating mobility and self-care at the household level. 5. Psychiatrist will provide 24 hour management of medical needs as well as oversight of the therapy plan/treatment and provide guidance as appropriate regarding the interaction of the two. 6. 24 hour rehab nursing will assist in the management of Bowel program, bladder program, patient education, safety, pain management, skin integrity nursing interventions and help integrate therapy concepts, techniques,education, etc. 7. PT will assess and treat for:  .Pre-gait training, gait training, endurance, equipment, safety, strengthening, range of motion  Goals are Supervision to modified independent mobility. 8. OT will assess and treat for  ADLs, cognitive perceptual skills, safety, equipment, endurance, strengthening, range of motion.  Goals are: Modified independent upper body ADLs and supervision min assist lower body ADLs.  9.  10. Case Management and Social Worker will assess and treat for psychological issues and discharge planning. 11. Team conference will be held weekly to establish goals to assess progress and todetermine barriers to discharge. 12.  Patient will  receive at least 3 hours of therapy per day at least 5 days per week. 13. ELOS and Prognosis: 7 days prognosis for functional recovery is good    PT: ROM, Strengthening, Bed Modility, Transfers, Pre Gait Training, Investment banker, operational,  Equipment  OT: ROM, Strengthening, ADL's, Cognitive/Perceptual Training, Splinting, Equipment  Rehab RN: Skin Care, Wound Care, Bowel & Bladder Training  Case Management: Assess Home Environment, Assist with Discharge Planning, Arrange for Appropriate Follow up Care  SW: Assess Family/Social Support, Counsel patient & Family regarding Disability issues, Assist Discharge Planning    Medical Problem List and Plan: 1. DVT Prophylaxis/Anticoagulation: Pharmaceutical: Lovenox 2. Pain Management: Acetaminophen 3. Mood: Zoloft ego support  Patient Active Problem List  Diagnosis  . Leukemia, chronic lymphoid-Heme/Onc f/u  . Transplant recipient Heme/Onc f/u  . Chronic graft-versus-host disease Heme/Onc f/u  . Diverticular disease-monitor for Lower GI bleed        . Hypertension monitor with activity     .   .   . CKD (chronic kidney disease) stage 2, GFR 60-89 ml/min-monitor BMET  . Pancytopenia monitor counts  . Steroid-induced diabetes-check CBGs  . Atrial fibrillationControl rate  . Myopathy- may have more  weakness than expected with deconditioning  . Fine tremor medication related  .   Marland Kitchen Dehydration improved f/u BMET  . Nausea and vomiting-monitor I/O supplement IVF if needed  . Immunocompromised-monitor fever  . Chronic GVHD complicating bone marrow transplantationHeme/Onc f/u  . Diarrhea in adult patient-monitor fluids, antidiarrheals  . CLL (chronic lymphocytic leukemia)Heme/Onc f/u     . Physical deconditioning initiate rehab    Erick Colace 12/09/2012, 4:16 PM

## 2012-12-09 NOTE — Progress Notes (Signed)
Patient information reviewed and entered into eRehab system by Avonlea Sima, RN, CRRN, PPS Coordinator.  Information including medical coding and functional independence measure will be reviewed and updated through discharge.     Per nursing patient was given "Data Collection Information Summary for Patients in Inpatient Rehabilitation Facilities with attached "Privacy Act Statement-Health Care Records" upon admission.  

## 2012-12-09 NOTE — Progress Notes (Signed)
Pt was d/c'ed to CIR at 1315 via [facility], Room 1449. Receiving RN, Milly Jakob received report. Pt dressed and stable.

## 2012-12-09 NOTE — Plan of Care (Addendum)
Overall Plan of Care [facility]) Patient Details Name: Timothy [name] MRN: [id number] DOB: 1946-12-10  Diagnosis:  Deconditioning relate to CLL and recent bone marrow transplant  Primary Diagnosis:    Physical deconditioning Co-morbidities: GVHD, edema, malnutriion, CKD,cmv, myopathy  Functional Problem List  Patient demonstrates impairments in the following areas: Balance, Bladder, Bowel, Edema, Endurance, Medication Management, Motor, Nutrition, Pain, Safety, Skin Integrity and Vision  Basic ADL's: grooming, bathing, dressing and toileting   Transfers:  bed mobility, bed to chair, toilet, tub/shower and car Locomotion:  ambulation, wheelchair mobility and stairs  Additional Impairments:  Leisure Awareness  Anticipated Outcomes Item Anticipated Outcome  Eating/Swallowing    Basic self-care  Modified Independent-Supervision  Tolieting   Modified independent   Bowel/Bladder    Remain continent of bowel/bladder  Transfers  Shower: supervision. Toilet: Mod I; Mod I basic transfers, supervision car  Locomotion  Supervision with RW  Communication    Cognition    Pain  3 or less on scale 0-10  Safety/Judgment    Other     Therapy Plan: PT Intensity: Minimum of 1-2 x/day ,45 to 90 minutes PT Frequency: Total of 15 hours over 7 days;5 out of 7 days PT Duration Estimated Length of Stay: 2-2.5 weeks PT Treatment/Interventions: Ambulation/gait training;Balance/vestibular training;Discharge planning;Community reintegration;DME/adaptive equipment instruction;Functional mobility training;Neuromuscular re-education;Patient/family education;Skin care/wound management;Stair training;Therapeutic Activities;Therapeutic Exercise;UE/LE Strength taining/ROM;UE/LE Coordination activities;Wheelchair propulsion/positioning OT Intensity: Minimum of 1-2 x/day, 45 to 90 minutes OT Frequency: 5 out of 7 days OT Duration/Estimated Length of Stay: 2.5-3 weeks OT Treatment/Interventions:  Balance/vestibular training;Community reintegration;DME/adaptive equipment instruction;Discharge planning;Functional mobility training;Self Care/advanced ADL retraining;Therapeutic Exercise;Therapeutic Activities;Patient/family education;UE/LE Strength taining/ROM        Team Interventions: Item RN PT OT SLP SW TR Other  Self Care/Advanced ADL Retraining   x      Neuromuscular Re-Education  x       Therapeutic Activities  x x   x   UE/LE Strength Training/ROM  x x   x   UE/LE Coordination Activities  x x   x   Visual/Perceptual Remediation/Compensation         DME/Adaptive Equipment Instruction  x x   x   Therapeutic Exercise  x x   x   Balance/Vestibular Training  x x   x   Patient/Family Education x x x   x   Cognitive Remediation/Compensation         Functional Mobility Training  x x   x   Ambulation/Gait Training  x       Stair Training  x       Wheelchair Propulsion/Positioning  x       Functional Tourist information centre manager Reintegration      x   Dysphagia/Aspiration Landscape architect Facilitation         Bladder Management x        Bowel Management x        Disease Management/Prevention         Pain Management x        Medication Management x        Skin Care/Wound Management x x       Splinting/Orthotics         Discharge Planning  x x   x   Psychosocial Support      x  Team Discharge Planning: Destination:  Home Projected Follow-up:  PT, OT and Home Health Projected Equipment Needs:  Wheelchair Patient/family involved in discharge planning:  Yes  MD ELOS: 2 to 2.5 weeks Medical Rehab Prognosis:  Excellent Assessment: Pt admitted for CIR therapies. The team will be addressing Lower extremity strength, range of motion, stamina, balance, functional mobility, safety, adaptive techniques and equipment, ADL's, skin care, pain mgt, bowel and bladder. Goals are mod I to supervision. Course may be affected by  multiple co-morbidities associated with his disease.

## 2012-12-09 NOTE — Progress Notes (Signed)
Patient admit to rehab at 1335 by ambulance.  Admission package provided with explanation.  Patient A&O x 4,  oriented to unit.  Fall prevention/safety explained, side rails up x3, call bell within reach.

## 2012-12-10 ENCOUNTER — Inpatient Hospital Stay (HOSPITAL_COMMUNITY): Payer: BC Managed Care – PPO | Admitting: Physical Therapy

## 2012-12-10 ENCOUNTER — Inpatient Hospital Stay (HOSPITAL_COMMUNITY): Payer: BC Managed Care – PPO

## 2012-12-10 ENCOUNTER — Encounter: Payer: Self-pay | Admitting: Oncology

## 2012-12-10 DIAGNOSIS — R5381 Other malaise: Secondary | ICD-10-CM

## 2012-12-10 LAB — COMPREHENSIVE METABOLIC PANEL
ALT: 13 U/L (ref 0–53)
CO2: 18 mEq/L — ABNORMAL LOW (ref 19–32)
Calcium: 7.3 mg/dL — ABNORMAL LOW (ref 8.4–10.5)
Chloride: 109 mEq/L (ref 96–112)
Creatinine, Ser: 1.04 mg/dL (ref 0.50–1.35)
GFR calc Af Amer: 84 mL/min — ABNORMAL LOW (ref >90)
GFR calc non Af Amer: 73 mL/min — ABNORMAL LOW (ref >90)
Glucose, Bld: 82 mg/dL (ref 70–99)
Total Bilirubin: 0.2 mg/dL — ABNORMAL LOW (ref 0.3–1.2)

## 2012-12-10 LAB — CBC WITH DIFFERENTIAL/PLATELET
Eosinophils Relative: 0 % (ref 0–5)
HCT: 29.5 % — ABNORMAL LOW (ref 39.0–52.0)
Hemoglobin: 10.6 g/dL — ABNORMAL LOW (ref 13.0–17.0)
Lymphocytes Relative: 38 % (ref 12–46)
Lymphs Abs: 2 10*3/uL (ref 0.7–4.0)
MCV: 84 fL (ref 78.0–100.0)
Monocytes Absolute: 0.4 10*3/uL (ref 0.1–1.0)
RBC: 3.51 MIL/uL — ABNORMAL LOW (ref 4.22–5.81)
WBC: 5.2 10*3/uL (ref 4.0–10.5)

## 2012-12-10 MED ORDER — SACCHAROMYCES BOULARDII 250 MG PO CAPS
250.0000 mg | ORAL_CAPSULE | Freq: Two times a day (BID) | ORAL | Status: DC
  Administered 2012-12-10 – 2012-12-15 (×12): 250 mg via ORAL
  Filled 2012-12-10 (×15): qty 1

## 2012-12-10 NOTE — Evaluation (Signed)
Physical Therapy Assessment and Plan  Patient Details  Name: Timothy [name] MRN: [id number] Date of Birth: 09-13-1946  PT Diagnosis: Difficulty walking, Edema and Muscle weakness Rehab Potential: Good ELOS: 2-2.5 weeks   Today's Date: 12/10/2012 Time: 4098-1191 Time Calculation (min): 65 min  Problem List:  Patient Active Problem List  Diagnosis  . Leukemia, chronic lymphoid  . Transplant recipient  . 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 Diverticular disease  . Hyperlipidemia  . Obesity  . Hypertension  . Hiatal hernia  . CMV (cytomegalovirus) antibody positive  . Right bundle branch block  . CKD (chronic kidney disease) stage 2, GFR 60-89 ml/min  . Pancytopenia  . Steroid-induced diabetes  . Atrial fibrillation  . Myopathy  . Fine tremor  . Diarrhea  . Dehydration  . Nausea and vomiting  . Immunocompromised  . Chronic GVHD complicating bone marrow transplantation  . Diarrhea in adult patient  . CLL (chronic lymphocytic leukemia)  . Rash of face  . Physical deconditioning    Past Medical History:  Past Medical History  Diagnosis Date  . 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 Diverticular disease   . Hyperlipidemia   . Obesity   . Hypertension   . Hiatal hernia   . CMV (cytomegalovirus) antibody positive     pre-transplant, with seroconversion x2 pst-transplant  . Right bundle branch block     pre-transplant  . CKD (chronic kidney disease) stage 2, GFR 60-89 ml/min   . Pancytopenia   . Steroid-induced diabetes   . Atrial fibrillation     post-transplant  . Myopathy   . 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 Leukemia, chronic lymphoid   . 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 Diarrhea in adult patient 12/05/2012    Due to active GVHD  . CLL (chronic lymphocytic leukemia) 12/05/2012    Dx 07/1999; started Rx 12/06  AlloBMT 3/13  . Rash of  face 12/05/2012    Due to GVHD   Past Surgical History:  Past Surgical History  Procedure Date  . Tonsillectomy and adenoidectomy   . Bone marrow transplant   . Flexible sigmoidoscopy 11/17/2012    Procedure: FLEXIBLE SIGMOIDOSCOPY;  Surgeon: Petra Kuba, MD;  Location: [facility];  Service: Endoscopy;  Laterality: N/A;  Dr Ewing Schlein states will be admitted to rooom 1339 11/16/12  . Esophagogastroduodenoscopy 11/17/2012    Procedure: ESOPHAGOGASTRODUODENOSCOPY (EGD);  Surgeon: Petra Kuba, MD;  Location: [facility];  Service: Endoscopy;  Laterality: N/A;    Assessment & Plan Clinical Impression: Patient is a 66 y.o. male with CLL s/p allogenic stem cell transplant, reactivation of CMV who has had ongoing problems with diarrhea and weight loss. Recent work up consistent with graft v/s host disease as well as active gastritis and mild distal esophagitis. He was readmitted on 12/05/12 with recurrent diarrhea. He treated with hydration and short course of solu-medrol.  Patient transferred to CIR on 12/09/2012 .   Patient currently requires to total A to maintain standing/balance when fatigued with mobility secondary to muscle weakness, decreased cardiorespiratoy endurance and decreased standing balance, decreased postural control and decreased balance strategies.  Prior to hospitalization, patient was mod I with 4ww when weak/fatigued with mobility and lived with Spouse in a House home.  Home access is 3Stairs to enter with bilat rails and 14 stairs with one rail to access second level bed/bath.  Patient will benefit from  skilled PT intervention to maximize safe functional mobility, minimize fall risk and decrease caregiver burden for planned discharge home with 24 hour supervision.  Anticipate patient will benefit from follow up HH at discharge.  PT - End of Session Activity Tolerance: Decreased this session Endurance Deficit: Yes Endurance Deficit Description: Chronic Leukemia, frequent  diarrhea, deconditioned from multiple hospitalizations, graft. vs. host disease PT Assessment Rehab Potential: Good Barriers to Discharge: Inaccessible home environment;Other (comment) Barriers to Discharge Comments: bed/bath on second level, wife only able to provide supervision-min A PT Plan PT Intensity: Minimum of 1-2 x/day ,45 to 90 minutes PT Frequency: Total of 15 hours over 7 days;5 out of 7 days PT Duration Estimated Length of Stay: 2-2.5 weeks PT Treatment/Interventions: Ambulation/gait training;Balance/vestibular training;Discharge planning;Community reintegration;DME/adaptive equipment instruction;Functional mobility training;Neuromuscular re-education;Patient/family education;Skin care/wound management;Stair training;Therapeutic Activities;Therapeutic Exercise;UE/LE Strength taining/ROM;UE/LE Coordination activities;Wheelchair propulsion/positioning PT Recommendation Follow Up Recommendations: Home health PT Equipment Recommended: Wheelchair (measurements);Wheelchair cushion (measurements)  Attempted KHT for edema management but unable to don on LE; Ace wraps donned but patient reporting numbness and tingling in R foot; removed Ace wrap and re-wrapped loosely.  R foot blanched and cold.  At end of session patient placed in recliner with heels floating and LE elevated for edema management, prevention of pressure ulcer with ace wraps removed secondary to reports of tingling again.  RN and PA notified of tingling and discussed with PA use of PREVALON boot in bed for pressure relief of bilat heels. PA to examine LE.  Patient may require therapy spread out 15 hours over 7 days if unable to tolerate full therapy schedule.  Will continue to assess.  PT Evaluation Precautions/Restrictions Precautions Precautions: Fall Precaution Comments: AFIB, chronic leukemia, immuno compromised, graft vs. host disease; LE edema, ace wrap and elevate LE General [email or web address] ([id number]) Vital  Signs Therapy Vitals Pulse Rate: 72  BP: 100/71 mmHg Patient Position, if appropriate: Sitting Oxygen Therapy SpO2: 97 % O2 Device: None (Room air) Pain Pain Assessment Pain Assessment: No/denies pain Home Living/Prior Functioning Home Living Lives With: Spouse Available Help at Discharge: Family;Available 24 hours/day Type of Home: House Home Access: Stairs to enter [employer] of Steps: 3 Entrance Stairs-Rails: Can reach both Home Layout: Two level;1/2 bath on main level;Bed/bath upstairs Alternate Level Stairs-Number of Steps: 14 Alternate Level Stairs-Rails: Left Home Adaptive Equipment: Walker - four wheeled;Bedside commode/3-in-1 Additional Comments: Reports after last hospitalization patient unable to perform flight of stairs and had to remain on main level using couch to sleep and 1/2 bath Prior Function Level of Independence: Independent with gait;Independent with transfers;Requires assistive device for independence (use of RW when weak) Able to Take Stairs?: Yes Driving: No Vocation: Part time employment 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 [employer]  Vision/Perception  Vision - History Baseline Vision: No visual deficits Clinical biochemist: Within Functional Limits Praxis Praxis: Intact  Cognition Overall Cognitive Status: Appears within functional limits for tasks assessed Sensation Sensation Light Touch: Appears Intact (reports tingling from edema and pressure from ACE wraps) Stereognosis: Not tested Hot/Cold: Not tested Proprioception: Appears Intact Coordination Gross Motor Movements are Fluid and Coordinated: No Fine Motor Movements are Fluid and Coordinated: Yes Coordination and Movement Description: mild tremor, coordination limited by weakness Motor  Motor Motor - Skilled Clinical Observations: LE weakness from significant deconditioning, poor functional endurance  Mobility Bed Mobility Bed Mobility:  Supine to Sit Supine to Sit: 3: Mod assist;HOB elevated Transfers Sit to Stand: 5: Supervision Stand to Sit: 5: Supervision Stand Pivot Transfers: 4: Min assist;With  armrests;3: Mod assist Stand Pivot Transfer Details (indicate cue type and reason): Patient performed sit <> stand from recliner, w/c and toilet multiple times with min-mod A as patient fatigued with intermittent verbal cues to push with UE from surface sitting on Locomotion  Ambulation Ambulation/Gait Assistance: 4: Min assist;1: +1 Total assist Ambulation Distance (Feet): 10 Feet Assistive device: Rolling walker Ambulation/Gait Assistance Details: Patient able to perform gait with RW x 10' to toilet with min A with verbal cues for safety/sequencing in small, narrow spaces; returning to recliner patient LE gave out twice with total A to recover and to safely sit in recliner.   Gait Gait Pattern: Trunk flexed Stairs / Additional Locomotion Stairs: No Corporate treasurer: Yes Wheelchair Assistance: 5: Investment banker, operational: Both upper extremities Wheelchair Parts Management: Needs assistance Distance: 50 with verbal cues for UE propulsion sequence for changes in direction; patient fatigued very quickly  Trunk/Postural Assessment  Cervical Assessment Cervical Assessment: Within Functional Limits Thoracic Assessment Thoracic Assessment: Within Functional Limits Lumbar Assessment Lumbar Assessment: Within Functional Limits Postural Control Postural Control: Deficits on evaluation (decreased ankle and hip strategies to maintain balance)  Balance Static Standing Balance Static Standing - Balance Support: Bilateral upper extremity supported;During functional activity Static Standing - Level of Assistance: 5: Stand by assistance;4: Min assist Static Standing - Comment/# of Minutes: 1-2 minutes at a time; patient unable release RW to assist with hygiene or donning of clean brief after  incontinent BM Dynamic Standing Balance Dynamic Standing - Balance Support: Bilateral upper extremity supported;During functional activity Dynamic Standing - Level of Assistance: 4: Min assist;1: +1 Total assist Dynamic Standing - Comments: As patient fatigues he can require total A to maintain standing secondary to LE giving out Extremity Assessment  RUE Assessment RUE Assessment: Exceptions to [facility] RUE Strength Right Shoulder Flexion: 3+/5 Right Shoulder Horizontal ABduction: 3+/5 Right Shoulder Horizontal ADduction: 3+/5 Right Elbow Flexion: 3+/5 Right Elbow Extension: 3+/5 Gross Grasp: Functional LUE Assessment LUE Assessment: Exceptions to [facility] LUE Strength Left Shoulder Flexion: 3+/5 Left Shoulder Horizontal ABduction: 3+/5 Left Shoulder Horizontal ADduction: 3+/5 Left Elbow Flexion: 3+/5 Left Elbow Extension: 3+/5 Gross Grasp: Functional RLE Assessment RLE Assessment: Exceptions to [facility] RLE Strength RLE Overall Strength: Deficits RLE Overall Strength Comments: 2/5 hip flexion, 3/5 knee flexion, extension and ankle DF; significant pitting edema noted, toes blanched, cold LLE Assessment LLE Assessment: Exceptions to [facility] LLE Strength LLE Overall Strength: Deficits LLE Overall Strength Comments: 2/5 hip flexion, 4/5 knee flexion, extension, ankle DF but poor muscular endurance with transfers, WB  See FIM for current functional status Refer to Care Plan for Long Term Goals  Recommendations for other services: None  Discharge Criteria: Patient will be discharged from PT if patient refuses treatment 3 consecutive times without medical reason, if treatment goals not met, if there is a change in medical status, if patient makes no progress towards goals or if patient is discharged from hospital.  The above assessment, treatment plan, treatment alternatives and goals were discussed and mutually agreed upon: by patient  Edman Circle Faucette 12/10/2012, 1:13 PM

## 2012-12-10 NOTE — Progress Notes (Signed)
INITIAL NUTRITION ASSESSMENT  DOCUMENTATION CODES Per approved criteria  -Severe malnutrition in the context of chronic illness   INTERVENTION: Continue Regular diet as tolerated and provide patient preferences. Continue Vital.  Encouraged pt drink 1 can 4 times dialy.  1 can provides:   356 kcal, 16 gm protein   NUTRITION DIAGNOSIS: Altered GI function related to multiple causes as evidenced by N/V/D.   Goal: Tolerate regular diet with intake of meals and supplements to prevent further weight los.  Monitor:  PO intake, diet tolerance, labs, weight trend  Reason for Assessment:  Nutrition Risk 66 y.o. male  Admitting Dx: Physical deconditioning, chronic lymphoid leukemia s/p allogenic transplant and chemo  ASSESSMENT:   Saw patient at lunch today.  Pt is ordering very little.  Broth and jello at lunch.  N/V last night.  Still with diarrhea when he eats although states that he does better when he drinks the Vital 1.5.  States that he likes this and has been drinking about 2 cans per day.  Discussed with patient need to increase intake of Vital 1.5 if unable to tolerate meal intake.  Pt receptive.    Pt meets criteria for severe malnutrition of chronic illness AEB <75% estimated energy intake for at least the past month with 33% weight loss in the past 9 months with 12% of that weight loss int he past 2 months.  Height: Ht Readings from Last 1 Encounters:  12/09/12 5\' 7"  (1.702 m)    Weight: Wt Readings from Last 1 Encounters:  12/09/12 185 lb 13.6 oz (84.3 kg)    Ideal Body Weight: 67 kg  % Ideal Body Weight: 126  Wt Readings from Last 10 Encounters:  12/09/12 185 lb 13.6 oz (84.3 kg)  12/05/12 185 lb 3 oz (84 kg)  12/03/12 190 lb 4.1 oz (86.3 kg)  12/03/12 190 lb 4.1 oz (86.3 kg)  11/10/12 179 lb 11.2 oz (81.511 kg)  11/04/12 180 lb 8 oz (81.874 kg)  10/27/12 185 lb (83.915 kg)  10/21/12 185 lb 6.4 oz (84.097 kg)  10/14/12 189 lb 1.6 oz (85.775 kg)  10/06/12 192  lb 12.8 oz (87.454 kg)    Usual Body Weight: 275# in February 2013 per pt report  % Usual Body Weight: 67  BMI:  Body mass index is 29.11 kg/(m^2).  Estimated Nutritional Needs: Kcal: 2200-2300 Protein: 100-105 gm Fluid: 2.2-2.3 L daily  Skin: wam and dry  Diet Order: General  EDUCATION NEEDS: -No education needs identified at this time   Intake/Output Summary (Last 24 hours) at 12/10/12 1135 Last data filed at 12/10/12 1035  Gross per 24 hour  Intake    360 ml  Output      0 ml  Net    360 ml    Labs:   Lab 12/10/12 0535 12/09/12 0545 12/08/12 0550 12/07/12 0530 12/06/12 0635 12/05/12 1507  NA 134* 132* 130* -- -- --  K 4.3 4.8 4.5 -- -- --  CL 109 109 106 -- -- --  CO2 18* 19 19 -- -- --  BUN 18 19 22  -- -- --  CREATININE 1.04 1.03 1.04 -- -- --  CALCIUM 7.3* 6.7* 7.2* -- -- --  MG -- -- -- 1.6 1.5 1.6  PHOS -- -- -- 2.7 -- --  GLUCOSE 82 77 80 -- -- --    CBG (last 3)  No results found for this basename: GLUCAP:3 in the last 72 hours  Scheduled Meds:   . acyclovir  400 mg Oral BID  . antiseptic oral rinse  15 mL Mouth Rinse q12n4p  . budesonide  9 mg Oral Daily  . enoxaparin (LOVENOX) injection  40 mg Subcutaneous Q24H  . feeding supplement (VITAL 1.5 CAL)  237 mL Oral TID WC & HS  . fluconazole  100 mg Oral q morning - 10a  . labetalol  200 mg Oral BID  . pantoprazole  40 mg Oral Daily  . predniSONE  5 mg Oral Q breakfast  . saccharomyces boulardii  250 mg Oral BID  . sertraline  100 mg Oral QODAY  . sertraline  50 mg Oral QODAY  . sulfamethoxazole-trimethoprim  1 tablet Oral 2 times weekly  . tacrolimus  1.5 mg Oral BID  . testosterone  1 patch Transdermal Daily    Continuous Infusions:   Past Medical History  Diagnosis Date  . 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 Diverticular disease   . Hyperlipidemia   . Obesity   . Hypertension   . Hiatal hernia   . CMV (cytomegalovirus) antibody positive      pre-transplant, with seroconversion x2 pst-transplant  . Right bundle branch block     pre-transplant  . CKD (chronic kidney disease) stage 2, GFR 60-89 ml/min   . Pancytopenia   . Steroid-induced diabetes   . Atrial fibrillation     post-transplant  . Myopathy   . 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 Leukemia, chronic lymphoid   . 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 Diarrhea in adult patient 12/05/2012    Due to active GVHD  . CLL (chronic lymphocytic leukemia) 12/05/2012    Dx 07/1999; started Rx 12/06  AlloBMT 3/13  . Rash of face 12/05/2012    Due to GVHD    Past Surgical History  Procedure Date  . Tonsillectomy and adenoidectomy   . Bone marrow transplant   . Flexible sigmoidoscopy 11/17/2012    Procedure: FLEXIBLE SIGMOIDOSCOPY;  Surgeon: Petra Kuba, MD;  Location: [facility];  Service: Endoscopy;  Laterality: N/A;  Dr Ewing Schlein states will be admitted to rooom 1339 11/16/12  . Esophagogastroduodenoscopy 11/17/2012    Procedure: ESOPHAGOGASTRODUODENOSCOPY (EGD);  Surgeon: Petra Kuba, MD;  Location: [facility];  Service: Endoscopy;  Laterality: N/A;    Oran Rein, RD, LDN Clinical Inpatient Dietitian Pager:  [phone] Weekend and after hours pager:  [phone]

## 2012-12-10 NOTE — Evaluation (Addendum)
Occupational Therapy Assessment and Plan  Patient Details  Name: Timothy [name] MRN: [id number] Date of Birth: June 27, 1946  OT Diagnosis: muscle weakness (generalized) Rehab Potential: Rehab Potential: Excellent ELOS: 2.5-3 weeks  Today's Date: 12/10/2012 Time: 7829-5621 Time Calculation (min): 70 min  Problem List:  Patient Active Problem List  Diagnosis  . Leukemia, chronic lymphoid  . Transplant recipient  . 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 Diverticular disease  . Hyperlipidemia  . Obesity  . Hypertension  . Hiatal hernia  . CMV (cytomegalovirus) antibody positive  . Right bundle branch block  . CKD (chronic kidney disease) stage 2, GFR 60-89 ml/min  . Pancytopenia  . Steroid-induced diabetes  . Atrial fibrillation  . Myopathy  . Fine tremor  . Diarrhea  . Dehydration  . Nausea and vomiting  . Immunocompromised  . Chronic GVHD complicating bone marrow transplantation  . Diarrhea in adult patient  . CLL (chronic lymphocytic leukemia)  . Rash of face  . Physical deconditioning    Past Medical History:  Past Medical History  Diagnosis Date  . 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 Diverticular disease   . Hyperlipidemia   . Obesity   . Hypertension   . Hiatal hernia   . CMV (cytomegalovirus) antibody positive     pre-transplant, with seroconversion x2 pst-transplant  . Right bundle branch block     pre-transplant  . CKD (chronic kidney disease) stage 2, GFR 60-89 ml/min   . Pancytopenia   . Steroid-induced diabetes   . Atrial fibrillation     post-transplant  . Myopathy   . 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 Leukemia, chronic lymphoid   . 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 Diarrhea in adult patient 12/05/2012    Due to active GVHD  . CLL (chronic lymphocytic leukemia) 12/05/2012    Dx 07/1999; started Rx 12/06  AlloBMT 3/13  .  Rash of face 12/05/2012    Due to GVHD   Past Surgical History:  Past Surgical History  Procedure Date  . Tonsillectomy and adenoidectomy   . Bone marrow transplant   . Flexible sigmoidoscopy 11/17/2012    Procedure: FLEXIBLE SIGMOIDOSCOPY;  Surgeon: Petra Kuba, MD;  Location: [facility];  Service: Endoscopy;  Laterality: N/A;  Dr Ewing Schlein states will be admitted to rooom 1339 11/16/12  . Esophagogastroduodenoscopy 11/17/2012    Procedure: ESOPHAGOGASTRODUODENOSCOPY (EGD);  Surgeon: Petra Kuba, MD;  Location: [facility];  Service: Endoscopy;  Laterality: N/A;    Assessment & Plan Clinical Impression: Patient is a 66 y.o. year old male with recent admission to the hospital with CLL s/p allogenic stem cell transplant, reactivation of CMV who has had ongoing problems with diarrhea and weight loss. Recent work up consistent with graft v/s host disease as well as active gastritis and mild distal esophagitis. He was readmitted on 12/05/12 with recurrent diarrhea. He treated with hydration and short course of solu-medrol. Patient transferred to CIR on 12/09/2012 .    Patient currently requires Supervision for grooming/UB dressing; Mod Assist for bathing; Max Assist for toileting; Total Assist for LB dressing. secondary to muscle weakness.  Prior to hospitalization, patient could complete BADL with Modified Indepedence.  Patient will benefit from skilled intervention to increase independence with basic self-care skills prior to discharge home with wife.  Anticipate patient will require no supervision and follow up home health.  OT - End of Session  Endurance Deficit: Yes OT Assessment Rehab Potential: Excellent Barriers to Discharge: None OT Plan OT Intensity: Minimum of 1-2 x/day, 45 to 90 minutes OT Frequency: 5 out of 7 days OT Duration/Estimated Length of Stay: 2.5-3 weeks OT Treatment/Interventions: Balance/vestibular training;Community reintegration;DME/adaptive equipment  instruction;Discharge planning;Functional mobility training;Self Care/advanced ADL retraining;Therapeutic Exercise;Therapeutic Activities;Patient/family education;UE/LE Strength taining/ROM OT Recommendation Follow Up Recommendations: Home health OT Equipment Recommended: None recommended by OT  OT Evaluation Precautions/Restrictions  Precautions Precautions: Fall Precaution Comments: AFIB, chronic leukemia, immuno compromised, graft vs. host Pain Pain Assessment Pain Assessment: No/denies pain (pain only with movement) Home Living/Prior Functioning Home Living Lives With: Spouse Available Help at Discharge: Family;Available 24 hours/day Type of Home: House Home Access: Stairs to enter [employer] of Steps: 2 Entrance Stairs-Rails: Right;Left;Can reach both Home Layout: Two level;Bed/bath upstairs;1/2 bath on main level;Able to live on main level with bedroom/bathroom Alternate Level Stairs-Number of Steps: 10 Alternate Level Stairs-Rails: Left Bathroom Shower/Tub: Walk-in shower;Door [employer] Toilet: Standard Bathroom Accessibility: Yes How Accessible: Accessible via walker Home Adaptive Equipment: Dan Humphreys - four wheeled;Shower chair with back;Hand-held shower hose;Bedside commode/3-in-1 Additional Comments: walking sticks Prior Function Level of Independence: Independent with basic ADLs;Independent with transfers Able to Take Stairs?: Yes Driving: No Vocation: Full time employment 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 [employer] Vision/Perception  Vision - History Baseline Vision: No visual deficits Patient Visual Report: No change from baseline  Cognition Overall Cognitive Status: Appears within functional limits for tasks assessed Arousal/Alertness: Awake/alert Orientation Level: Oriented X4 Sensation Sensation Light Touch: Appears Intact Stereognosis: Appears Intact Hot/Cold: Appears Intact Proprioception: Appears  Intact Coordination Gross Motor Movements are Fluid and Coordinated: Yes Fine Motor Movements are Fluid and Coordinated: Yes Mobility  Bed Mobility Bed Mobility: Supine to Sit Supine to Sit: 3: Mod assist;HOB elevated Transfers Sit to Stand: 5: Supervision Stand to Sit: 5: Supervision  Extremity/Trunk Assessment RUE Assessment RUE Assessment: Exceptions to  [facility] RUE Strength Right Shoulder Flexion: 3+/5 Right Shoulder Horizontal ABduction: 3+/5 Right Shoulder Horizontal ADduction: 3+/5 Right Elbow Flexion: 3+/5 Right Elbow Extension: 3+/5 Gross Grasp: Functional LUE Assessment LUE Assessment: Exceptions to [facility] LUE Strength Left Shoulder Flexion: 3+/5 Left Shoulder Horizontal ABduction: 3+/5 Left Shoulder Horizontal ADduction: 3+/5 Left Elbow Flexion: 3+/5 Left Elbow Extension: 3+/5 Gross Grasp: Functional  See FIM for current functional status Refer to Care Plan for Long Term Goals  Recommendations for other services: None  Discharge Criteria: Patient will be discharged from OT if patient refuses treatment 3 consecutive times without medical reason, if treatment goals not met, if there is a change in medical status, if patient makes no progress towards goals or if patient is discharged from hospital.  The above assessment, treatment plan, treatment alternatives and goals were discussed and mutually agreed upon: by patient   OT eval completed this date. Patient presents with decreased ADL performance, functional transfer, bowel management, UB weakness. Patient normally used walking sticks when walking in the house and 4 wheeled walker when out in the community. Patient unable to hold BM and frequently has loose stools. Patient will benefit from AE for lower body bathing and dressing.   Limmie Patricia, OTR/L 12/10/2012, 10:44 AM

## 2012-12-10 NOTE — Progress Notes (Signed)
Social Work  Social Work Assessment and Plan  Patient Details  Name: Timothy [name] MRN: [id number] Date of Birth: 1946-03-29  Today's Date: 12/10/2012  Problem List:  Patient Active Problem List  Diagnosis  . Leukemia, chronic lymphoid  . Transplant recipient  . 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 Diverticular disease  . Hyperlipidemia  . Obesity  . Hypertension  . Hiatal hernia  . CMV (cytomegalovirus) antibody positive  . Right bundle branch block  . CKD (chronic kidney disease) stage 2, GFR 60-89 ml/min  . Pancytopenia  . Steroid-induced diabetes  . Atrial fibrillation  . Myopathy  . Fine tremor  . Diarrhea  . Dehydration  . Nausea and vomiting  . Immunocompromised  . Chronic GVHD complicating bone marrow transplantation  . Diarrhea in adult patient  . CLL (chronic lymphocytic leukemia)  . Rash of face  . Physical deconditioning   Past Medical History:  Past Medical History  Diagnosis Date  . 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 Diverticular disease   . Hyperlipidemia   . Obesity   . Hypertension   . Hiatal hernia   . CMV (cytomegalovirus) antibody positive     pre-transplant, with seroconversion x2 pst-transplant  . Right bundle branch block     pre-transplant  . CKD (chronic kidney disease) stage 2, GFR 60-89 ml/min   . Pancytopenia   . Steroid-induced diabetes   . Atrial fibrillation     post-transplant  . Myopathy   . 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 Leukemia, chronic lymphoid   . 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 Diarrhea in adult patient 12/05/2012    Due to active GVHD  . CLL (chronic lymphocytic leukemia) 12/05/2012    Dx 07/1999; started Rx 12/06  AlloBMT 3/13  . Rash of face 12/05/2012    Due to GVHD   Past Surgical History:  Past Surgical History  Procedure Date  . Tonsillectomy and adenoidectomy   . Bone  marrow transplant   . Flexible sigmoidoscopy 11/17/2012    Procedure: FLEXIBLE SIGMOIDOSCOPY;  Surgeon: Petra Kuba, MD;  Location: [facility];  Service: Endoscopy;  Laterality: N/A;  Dr Ewing Schlein states will be admitted to rooom 1339 11/16/12  . Esophagogastroduodenoscopy 11/17/2012    Procedure: ESOPHAGOGASTRODUODENOSCOPY (EGD);  Surgeon: Petra Kuba, MD;  Location: [facility];  Service: Endoscopy;  Laterality: N/A;   Social History:  reports that he has never smoked. He has never used smokeless tobacco. He reports that he does not drink alcohol or use illicit drugs.  Family / Support Systems Marital Status: Married How Long?: 47 years Patient Roles: Spouse;Parent Spouse/Significant Other: wife, Jayvier Burgher @ [phone] or [phone] Children: daughter (only child), Beverely Pace @ [phone] or ([phone] Anticipated Caregiver: Wife Ability/Limitations of Caregiver: min-mod A - pt does note that wife has been having episodes of afib lately and is to see cardiologist today (12/20) Caregiver Availability: 24/7 Family Dynamics: Pt notes wife, daughter and son-in-law are all very supportive - no strained dynamics noted  Social History Preferred language: English Religion: Unknown Cultural Background: NA Education: PhD Read: Yes Write: Yes Employment Status: Employed 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  Feels he is "doing pretty  well" emotionally in coping with his h/o CLL and current medical issues.  Denies any significant emotional distress, however, will monitor. Recent Psychosocial Issues: Bone Marrow transplant in March 2013 ([place]) with return home to [place] in July.  Notes his medical issues have "been stressful on my wife" and his worries about her own health. Pyschiatric History: Notes he was placed on Zoloft in 2000 when he was diagnosed with CLL.  Admits he "struggled with the thought that I was going to die for about a year but I moved through that.  I stay on the medicine", however, never received any formal counseling. Substance Abuse History: None  Patient / Family Perceptions, Expectations & Goals Pt/Family understanding of illness & functional limitations: Pt and wife with very good understanding of his complicated medical history and of current issues and functional limitations. Premorbid pt/family roles/activities: Pt continues to work full-time despite health issues and be the primary wage earner.  Wife and pt share responsibilities for care of the home. Anticipated changes in roles/activities/participation: Little change anticipated as pt was physically limited overall with strength/ endurance for a few weeks PTA and wife and daughter were providing needed support. Pt/family expectations/goals: "I was to build up by stamina...need to be able to go up a flight of stairs  [employer]: None Premorbid Home Care/DME Agencies: None Transportation available at discharge: yes Resource referrals recommended: Support group (specify) (as desired via [employer] programs)  Discharge Planning Living Arrangements: Spouse/significant other Support Systems: Spouse/significant other;Children;Friends/neighbors Type of Residence: Private residence Insurance Resources: Administrator (specify) Chief Executive Officer (primary)) Financial Resources: Employment Financial Screen  Referred: No Living Expenses: Own Money Management: Patient Do you have any problems obtaining your medications?: No Home Management: shared with pt and wife Patient/Family Preliminary Plans: Pt plans to d/c home with wife providing majority of assist needs - intermittent assist of daughter Social Work Anticipated Follow Up Needs: HH/OP;Support Group Expected length of stay: 7-10 days  Clinical Impression Very pleasant, oriented and motivated gentleman here due to overall deconditioning.  No significant emotional distress reported or noted, however, will monitor.  Anticipate relatively short LOS>  Anjoli Diemer 12/10/2012, 10:35 AM

## 2012-12-10 NOTE — Progress Notes (Signed)
Subjective/Complaints: Some nausea and vomiting last night. Diarrhea seems to be decreasing  Objective: Vital Signs: Blood pressure 138/85, pulse 75, temperature 98 F (36.7 C), temperature source Oral, resp. rate 19, height 5\' 7"  (1.702 m), weight 84.3 kg (185 lb 13.6 oz), SpO2 94.00%. No results found.  Basename 12/10/12 0535 12/09/12 1641  WBC 5.2 4.6  HGB 10.6* 10.8*  HCT 29.5* 30.2*  PLT 66* 72*    Basename 12/10/12 0535 12/09/12 0545  NA 134* 132*  K 4.3 4.8  CL 109 109  GLUCOSE 82 77  BUN 18 19  CREATININE 1.04 1.03  CALCIUM 7.3* 6.7*   CBG (last 3)  No results found for this basename: GLUCAP:3 in the last 72 hours  Wt Readings from Last 3 Encounters:  12/09/12 84.3 kg (185 lb 13.6 oz)  12/05/12 84 kg (185 lb 3 oz)  12/03/12 86.3 kg (190 lb 4.1 oz)    Physical Exam:  Constitutional: He is oriented to person, place, and time. He appears well-developed. pale HENT:  Head: Normocephalic and atraumatic.  Right Ear: External ear normal.  Left Ear: External ear normal.  Nose: Nose normal.  Mouth/Throat: Oropharynx is clear and moist.  Eyes: Conjunctivae normal and EOM are normal. Pupils are equal, round, and reactive to light.  Neck: Normal range of motion. Neck supple.  Cardiovascular: Normal rate, regular rhythm, normal heart sounds and intact distal pulses.  Respiratory: Effort normal and breath sounds normal. No respiratory distress. He has no wheezes. He has no rales. He exhibits no tenderness.  GI: Soft. Bowel sounds are scarce. He exhibits no distension. There is tenderness. There is no rebound and no guarding.  Genitourinary: Penis normal.  Musculoskeletal: Normal range of motion.  2+ edema bilateral pretibial 3+ bilateral feet  Neurological: He is alert and oriented to person, place, and time. He has normal reflexes. He displays no tremor. He exhibits normal muscle tone. Gait abnormal.  Motor strength is 4/5 in bilateral deltoid, biceps, triceps, grip 3  minus/5 in bilateral hip flexors knee extensors 4/5 bilateral ankle dorsiflexor plantar flexor some pain in addition with proximal leg movements Skin: Skin is warm and dry.  Psychiatric: He has a normal mood and affect. His behavior is normal. Judgment and thought content normal.    Assessment/Plan: 1. Functional deficits secondary to deconditioning related CLL recent bone marrow transplant and multiple medical conditions which require 3+ hours per day of interdisciplinary therapy in a comprehensive inpatient rehab setting. Physiatrist is providing close team supervision and 24 hour management of active medical problems listed below. Physiatrist and rehab team continue to assess barriers to discharge/monitor patient progress toward functional and medical goals. FIM:                   Comprehension Comprehension Mode: Auditory Comprehension: 5-Understands complex 90% of the time/Cues < 10% of the time  Expression Expression Mode: Verbal Expression: 5-Expresses complex 90% of the time/cues < 10% of the time  Social Interaction Social Interaction: 5-Interacts appropriately 90% of the time - Needs monitoring or encouragement for participation or interaction.  Problem Solving Problem Solving: 5-Solves complex 90% of the time/cues < 10% of the time  Memory Memory: 5-Recognizes or recalls 90% of the time/requires cueing < 10% of the time Medical Problem List and Plan:  1. DVT Prophylaxis/Anticoagulation: Pharmaceutical: Lovenox  2. Pain Management: Acetaminophen  3. Mood: Zoloft ego support 4. CLL/bone marrow transplant/: mgt per oncology 5. GVHD:  Per heme onc, immunosuppresants/antifungals 6. HTN 7. CKD:  regular BMET's, monitor I's and O's, weights. 8. Afib: rate controlled at present. Follow for activity tolerance 9. Steroid induced DM: follow CBG's, SSI as needed 10. Pancytopenia: serial CBC's. Close monitoring of clinical status, no active infection signs at present.  Counts appear relatively stable today 11. GI: diarrhea, N/V  -maintain fluids and electrolytes  -antidiarrheals, probiotic  -antiemetics, compazine and zofran 12. Mood: zoloft      LOS (Days) 1 A FACE TO FACE EVALUATION WAS PERFORMED  SWARTZ,ZACHARY T 12/10/2012 7:48 AM

## 2012-12-10 NOTE — Progress Notes (Signed)
Occupational Therapy Note  Patient Details  Name: Timothy [name] MRN: [id number] Date of Birth: 03-Jun-1946 Today's Date: 12/10/2012  Time: 1478-2956 Pt denies pain Individual Therapy Pt resting in bed upon arrival.  Focus of session on functional amb with RW in room and practicing walk-in shower transfer with RW.  Two methods demonstrated and patient opted to side step over ledge into shower.  Pt performed task with steady assist.  Pt returned to EOB to engage in BUE therex with theraband.  Pt returned to bed and requested laptop.  Call bell at side.   Lavone Neri [facility] 12/10/2012, 3:03 PM

## 2012-12-10 NOTE — Progress Notes (Signed)
Sheena Simonis   DOB:[birth year]   ZO#:[id number]   JWJ#:[id number]  Subjective: Is using BSC right after each meal, which is working well in terms of managing the BMs--no accidents today; feels very encouraged by all the work he did today; worried about wife [name] who is seeing cardiology today  Objective: middle aged white man examined in bed Filed Vitals:   12/10/12 1256  BP: 100/71  Pulse: 72  Temp:   Resp:     Body mass index is 29.11 kg/(m^2).   Face clear; no rash  Sclerae unicteric.  Oropharynx clear, not dry  No peripheral adenopathy  Lungs no rales or rhonchi, auscultated supine  Heart regular rate and rhythm, no murmur appreciated  Abdomen soft, NT, +BS  MSK 1+ bilateral LE edema; joints flexible;   Neuro nonfocal, A&O x3, positive affect; tremor due to tacrolimus  Skin: no rash; normal turgor  Labs:  Tacrolimus level 12/08/2012 = 7.5  Lab Results  Component Value Date   WBC 5.2 12/10/2012   HGB 10.6* 12/10/2012   HCT 29.5* 12/10/2012   MCV 84.0 12/10/2012   PLT 66* 12/10/2012   NEUTROABS 2.8 12/10/2012    Urine Studies No results found for this basename: UACOL:2,UAPR:2,USPG:2,UPH:2,UTP:2,UGL:2,UKET:2,UBIL:2,UHGB:2,UNIT:2,UROB:2,ULEU:2,UEPI:2,UWBC:2,URBC:2,UBAC:2,CAST:2,CRYS:2,UCOM:2,BILUA:2 in the last 72 hours  Basic Metabolic Panel:  Lab 12/10/12 6213 12/09/12 0545 12/08/12 0550 12/07/12 0530 12/06/12 0635 12/05/12 1507  NA 134* 132* 130* 130* 126* --  K 4.3 4.8 -- -- -- --  CL 109 109 106 103 100 --  CO2 18* 19 19 18* 18* --  GLUCOSE 82 77 80 112* 99 --  BUN 18 19 22 23 17  --  CREATININE 1.04 1.03 1.04 1.01 0.87 --  CALCIUM 7.3* 6.7* 7.2* 7.1* 7.1* --  MG -- -- -- 1.6 1.5 1.6  PHOS -- -- -- 2.7 -- --   GFR Estimated Creatinine Clearance: 72.5 ml/min (by C-G formula based on Cr of 1.04). Liver Function Tests:  Lab 12/10/12 0535 12/09/12 0545 12/08/12 0550 12/07/12 0530 12/06/12 0635  AST 16 13 12 15 16   ALT 13 11 12 13 14   ALKPHOS 68 63 62 57 52   BILITOT 0.2* 0.2* 0.2* 0.3 0.3  PROT 3.3* 3.3* 3.5* 3.7* 3.8*  ALBUMIN 1.4* 1.3* 1.4* 1.3* 1.2*   No results found for this basename: LIPASE:5,AMYLASE:5 in the last 168 hours No results found for this basename: AMMONIA:5 in the last 168 hours Coagulation profile No results found for this basename: INR:5,PROTIME:5 in the last 168 hours  CBC:  Lab 12/10/12 0535 12/09/12 1641 12/09/12 0545 12/08/12 0550 12/07/12 0530 12/06/12 0635  WBC 5.2 4.6 4.4 7.3 10.5 --  NEUTROABS 2.8 -- 2.9 5.8 9.0* 0.9*  HGB 10.6* 10.8* 9.9* 9.8* 10.0* --  HCT 29.5* 30.2* 28.0* 27.4* 27.9* --  MCV 84.0 84.1 84.1 84.6 84.0 --  PLT 66* 72* 86* 104* 118* --   Cardiac Enzymes: No results found for this basename: CKTOTAL:5,CKMB:5,CKMBINDEX:5,TROPONINI:5 in the last 168 hours BNP: No components found with this basename: POCBNP:5 CBG: No results found for this basename: GLUCAP:5 in the last 168 hours D-Dimer No results found for this basename: DDIMER:2 in the last 72 hours Hgb A1c No results found for this basename: HGBA1C:2 in the last 72 hours Lipid Profile No results found for this basename: CHOL:2,HDL:2,LDLCALC:2,TRIG:2,CHOLHDL:2,LDLDIRECT:2 in the last 72 hours Thyroid function studies No results found for this basename: TSH,T4TOTAL,FREET3,T3FREE,THYROIDAB in the last 72 hours Anemia work up No results found for this basename: VITAMINB12:2,FOLATE:2,FERRITIN:2,TIBC:2,IRON:2,RETICCTPCT:2 in the last 72 hours Microbiology  Recent Results (from the past 240 hour(s))  CLOSTRIDIUM DIFFICILE BY PCR     Status: Normal   Collection Time   12/06/12  2:30 AM      Component Value Range Status Comment   C difficile by pcr NEGATIVE  NEGATIVE Final    Rotovirus negative   Studies:  No results found.  Assessment: 66 y.o. [place] man with a history of well-differentiated lymphocytic lymphoma/ chronic lymphoid leukemia initially diagnosed in 2000, not requiring intervention until 2006; with multiple chromosomal  abnormalities; s/p allogeneic transplant March 2013 at "[facility]", now admitted with GVHD.  His treatment history is as follows:   (1) fludarabine/cyclophosphamide/rituximab x5 completed May 2007.  (2) rituximab for 8 doses October 2010, with partial response  (3) Leustatin and ofatumumab weekly x8 July to September 2011 followed by maintenance ofatumumab maintenance ofatumumab every 2 months, with initial response but rising counts September 2012  (4) status-post unrelated donor stem-cell transplant 02/24/2012 at the [facility]  (a) conditioning regimen consisted of fludarabine + TBI at 200 cGy, followed by rituximab x27;  (b) CMV reactivation x3 (patient CMV positive, donor negative), s/p ganciclovir treatment; 3d reactivation August 2013, s/p gancyclovir, with negative PCR mid-September 2013; last gancyclovir dose 10/06/2012  (c) GVHD: involving gut and skin, treated with steroids, tacrolimus and MMF initially, MMF eventually d/c'd and tacrolimus tapered (d) atrial fibrillation: resolved on amiodarone  (e) steroid-induced myopathy: improving until current crisis (f) hypomagnesemia: resolved with discontinuation of gancyclovir (g) hypogammaglobulinemia: s/p IVIG most recently 12/03/2012 (h) elevated triglycerides (606 on 07/14/2012)  (i) adrenal insufficiency: on prednisone  (j) pancytopenia; off growth factors (5) restaging studies September 2013 including CT scans, flow cytometry, and bone marrow biopsy, showed no evidence of residual chronic lymphoid leukemia.  (6) now with a skin rash, mouth changes, severe diarrhea and gastric/duodenal/colonic biopsies 11/17/2012 c/w GVHD grade 2, now down to grade 1 (BMs better formed, 2-3/24h, no rash, no chronic skin changes) (7) severe malnutrition (8) pancytopenia--stable (9) testosterone deficiency--being supplemented (10) deconditioning: REHAB  Plan: history and exam today very reassuring--I think his GVHD is currently well-controlled; he feels  he is already benefiting from the exercise in the Rehab unit and his affect is very positive; will recheck tacrolimus level Monday, will continue other meds until Jan at which time we will try to d/c prednisone and start a budesonide taper  MAGRINAT,GUSTAV C 12/10/2012

## 2012-12-10 NOTE — Progress Notes (Signed)
Physical Therapy Session Note  Patient Details  Name: Timothy [name] MRN: [id number] Date of Birth: 10/14/1946  Today's Date: 12/10/2012 Time: 4782-9562 Time Calculation (min): 45 min  Short Term Goals: Week 1:  PT Short Term Goal 1 (Week 1): Patient will consistently perform bed mobility and bed <> w/c transfers with min A, RW with no episodes of LE giving out PT Short Term Goal 2 (Week 1): Patient will perform w/c mobility in controlled environment for endurance x 150' with supervision and one rest break PT Short Term Goal 3 (Week 1): Patient will perform gait in controlled environment x 50' with LRAD and min A PT Short Term Goal 4 (Week 1): Patient will perform up and down 3-5 stairs with two rails and mod A  Skilled Therapeutic Interventions/Progress Updates:  Patient reports feeling stronger this afternoon.  Ace wraps in place on bilateral LEs, no c/o of numbness or tingling in feet.  Patient performed supine/sit transfer using elevated HOB and bedrails with min A.  Sit/stand transfer from EOB to RW with min A for steadying and initial standing balance.  Patient ambulated in controlled environment 72' with RW and min A x4 bouts with seated rest break after each bout (followed closely with WC).  LE strengthening:  Step-ups onto 4" step with RW for UE support: 5 reps left LE, 5 reps right LE with seated rest break between sets.  After extended break, second set of step-ups 10 reps left LE, 10 reps right LE with seated rest break between sets.  Emphasis to patient to gradually decrease weight bearing through UEs, with WC behind patient for additional safety.  Patient transferred from [facility] to bed with stand pivot transfer and min A.  Mod A to transfer sit/supine; mod A with HOB lowered and bedrails to scoot upward in bed.  Patient positioned with bilateral LEs elevated at end of session.  Pain: Pain Assessment Pain Assessment: No/denies pain   See FIM for current functional  status  Therapy/Group: Individual Therapy  Rexene Agent 12/10/2012, 1:58 PM

## 2012-12-11 ENCOUNTER — Inpatient Hospital Stay (HOSPITAL_COMMUNITY): Payer: BC Managed Care – PPO | Admitting: Physical Therapy

## 2012-12-11 ENCOUNTER — Inpatient Hospital Stay (HOSPITAL_COMMUNITY): Payer: BC Managed Care – PPO | Admitting: Occupational Therapy

## 2012-12-11 LAB — COMPREHENSIVE METABOLIC PANEL
ALT: 14 U/L (ref 0–53)
AST: 18 U/L (ref 0–37)
Albumin: 1.3 g/dL — ABNORMAL LOW (ref 3.5–5.2)
Alkaline Phosphatase: 67 U/L (ref 39–117)
BUN: 19 mg/dL (ref 6–23)
Chloride: 107 mEq/L (ref 96–112)
Potassium: 3.9 mEq/L (ref 3.5–5.1)
Sodium: 134 mEq/L — ABNORMAL LOW (ref 135–145)
Total Bilirubin: 0.2 mg/dL — ABNORMAL LOW (ref 0.3–1.2)
Total Protein: 3.1 g/dL — ABNORMAL LOW (ref 6.0–8.3)

## 2012-12-11 LAB — CBC WITH DIFFERENTIAL/PLATELET
Basophils Relative: 0 % (ref 0–1)
Eosinophils Absolute: 0 10*3/uL (ref 0.0–0.7)
Hemoglobin: 10.9 g/dL — ABNORMAL LOW (ref 13.0–17.0)
MCHC: 36.1 g/dL — ABNORMAL HIGH (ref 30.0–36.0)
Monocytes Relative: 9 % (ref 3–12)
Neutro Abs: 1.7 10*3/uL (ref 1.7–7.7)
Neutrophils Relative %: 36 % — ABNORMAL LOW (ref 43–77)
Platelets: 70 10*3/uL — ABNORMAL LOW (ref 150–400)
RBC: 3.59 MIL/uL — ABNORMAL LOW (ref 4.22–5.81)

## 2012-12-11 NOTE — Progress Notes (Signed)
Physical Therapy Note  Patient Details  Name: Mate Alegria MRN: [id number] Date of Birth: 29-Jun-1946 Today's Date: 12/11/2012  1100-1145 (45 minutes) individual Pain: pt reports pain low back with transitional movements/premedicated Focus of treatment: bilateral LE strengthening to improve functional mobility;gait training/endurance Treatment: Pt in bed requesting to stay in room secondary to bowel issues; bilateral LE strengthening in supine X 20 - ankle pumps, heel slides, hip abduction , SAQs ; supine to sit SBa with bedrail; sit to stand SBA; gait 120 feet RW X 2 min to SBA.   1430-1510 (40 minutes) individual Pain: no reported pain Focus of treatment: Therapeutic exercise focused on activity tolerance; gait training/endurance. Treatment: gait to/ from room to gym 120 feet RW SBA; Nustep Level 3 X 12 minutes (perceived exertion = hard); sit to supine (bed) mod assist bilateral LEs.   Tremell Reimers,JIM 12/11/2012, 7:31 AM

## 2012-12-11 NOTE — Progress Notes (Signed)
Occupational Therapy Session Note  Patient Details  Name: Timothy [name] MRN: [id number] Date of Birth: Jul 03, 1946  Today's Date: 12/11/2012 Time: 0907-1000 and 1330-1400 Time Calculation (min): 53 min and 30 min   Skilled Therapeutic Interventions/Progress Updates:    Visit 1: Pt seen for BADL retraining of bathing at shower level and dressing with a focus on functional mobilty and activity tolerance. Pt was able to ambulate in and out of bathroom well and stand to adjust clothing over hips.  He continues to have difficulty with reaching to his feet.  Will need training with AE.  Visit 2:Pt seen for AE training with reacher and sock aid. LE edema was greatly reduced, so TED hose applied versus ace wrap. Nurse informed.  Pt then stood at sink for 15 minutes to complete shaving with distant supervision.  Therapy Documentation Precautions:  Precautions Precautions: Fall Precaution Comments: AFIB, chronic leukemia, immuno compromised, graft vs. host disease; LE edema, ace wrap and elevate LE Restrictions Weight Bearing Restrictions: No   Pain: Pain Assessment Pain Assessment: No/denies pain Pain Score: 0-No pain ADL:  See FIM for current functional status  Therapy/Group: Individual Therapy  Kasara Schomer 12/11/2012, 12:03 PM

## 2012-12-11 NOTE — Progress Notes (Signed)
Subjective/Complaints: Still with diarrhea, but subjectively improving No other complaints  Objective: Vital Signs: Blood pressure 109/69, pulse 72, temperature 98.3 F (36.8 C), temperature source Oral, resp. rate 18, height 5\' 7"  (1.702 m), weight 185 lb 13.6 oz (84.3 kg), SpO2 95.00%.   Basename 12/11/12 0712 12/10/12 0535  NA 134* 134*  K 3.9 4.3  CL 107 109  GLUCOSE 70 82  BUN 19 18  CREATININE 1.18 1.04  CALCIUM 7.2* 7.3*   CBG (last 3)  No results found for this basename: GLUCAP:3 in the last 72 hours  Wt Readings from Last 3 Encounters:  12/09/12 185 lb 13.6 oz (84.3 kg)  12/05/12 185 lb 3 oz (84 kg)  12/03/12 190 lb 4.1 oz (86.3 kg)    Physical Exam:   chronically ii appearing male in no acute distress. HEENT exam atraumatic, normocephalic, neck supple without jugular venous distention. Chest clear to auscultation cardiac exam S1-S2 are regular. Affect normal    Assessment/Plan: 1. Functional deficits secondary to deconditioning related CLL recent bone marrow transplant and multiple medical conditions Medical Problem List and Plan:  1. DVT Prophylaxis/Anticoagulation: Pharmaceutical: Lovenox  2. Pain Management: Acetaminophen  3. Mood: Zoloft ego support- affect seems great 4. CLL/bone marrow transplant/: mgt per oncology- reviewed dr. Darrall Dears note 5. GVHD:  Per heme onc, immunosuppresants/antifungals 6. HTN BP Readings from Last 3 Encounters:  12/11/12 109/69  12/09/12 158/91  12/03/12 117/58   7. CKD: regular BMET's, monitor I's and O's, weights. 8. Afib: rate controlled at present. Follow for activity tolerance 9. Steroid induced DM: follow CBG's, SSI as needed 10. Pancytopenia: serial CBC's. Close monitoring of clinical status, no active infection signs at present. Counts appear relatively stable - reviewed labs 12/11/2012  11. GI: diarrhea, N/V- subjectively improving  -maintain fluids and electrolytes  -antidiarrheals, probiotic  -antiemetics,  compazine and zofran 12. Mood: zoloft      LOS (Days) 2 A FACE TO FACE EVALUATION WAS PERFORMED  [facility] HENRY 12/11/2012 8:46 AM

## 2012-12-12 ENCOUNTER — Other Ambulatory Visit: Payer: Self-pay | Admitting: Oncology

## 2012-12-12 ENCOUNTER — Inpatient Hospital Stay (HOSPITAL_COMMUNITY): Payer: Self-pay | Admitting: Occupational Therapy

## 2012-12-12 ENCOUNTER — Encounter (HOSPITAL_COMMUNITY): Payer: Self-pay | Admitting: Occupational Therapy

## 2012-12-12 ENCOUNTER — Inpatient Hospital Stay (HOSPITAL_COMMUNITY): Payer: BC Managed Care – PPO | Admitting: Physical Therapy

## 2012-12-12 LAB — COMPREHENSIVE METABOLIC PANEL
ALT: 15 U/L (ref 0–53)
AST: 18 U/L (ref 0–37)
Albumin: 1.3 g/dL — ABNORMAL LOW (ref 3.5–5.2)
Alkaline Phosphatase: 65 U/L (ref 39–117)
Calcium: 7.2 mg/dL — ABNORMAL LOW (ref 8.4–10.5)
GFR calc Af Amer: 70 mL/min — ABNORMAL LOW (ref >90)
Potassium: 3.7 mEq/L (ref 3.5–5.1)
Sodium: 133 mEq/L — ABNORMAL LOW (ref 135–145)
Total Protein: 3.2 g/dL — ABNORMAL LOW (ref 6.0–8.3)

## 2012-12-12 LAB — CBC WITH DIFFERENTIAL/PLATELET
Basophils Absolute: 0 10*3/uL (ref 0.0–0.1)
Basophils Relative: 0 % (ref 0–1)
Eosinophils Absolute: 0 10*3/uL (ref 0.0–0.7)
Eosinophils Relative: 0 % (ref 0–5)
Lymphs Abs: 3 10*3/uL (ref 0.7–4.0)
MCH: 30 pg (ref 26.0–34.0)
MCV: 84 fL (ref 78.0–100.0)
Neutrophils Relative %: 23 % — ABNORMAL LOW (ref 43–77)
Platelets: 68 10*3/uL — ABNORMAL LOW (ref 150–400)
RBC: 3.43 MIL/uL — ABNORMAL LOW (ref 4.22–5.81)
RDW: 16.5 % — ABNORMAL HIGH (ref 11.5–15.5)

## 2012-12-12 NOTE — Progress Notes (Signed)
Physical Therapy Note  Patient Details  Name: Fallon Howerter MRN: [id number] Date of Birth: 1946-10-24 Today's Date: 12/12/2012  1400-1540 (100 minutes) individual Pain: no initial pain /pt reports low back pain with transitional movements Focus of treatment: gait training /endurance; therapeutic exercises focused on bilateral LE strengthening; bed mobility training (mat) Treatment: Gait- pt ambulates 120 feet X 2 from room to gym and back with RW SBA; sit to supine (mat) SBA with difficulty bilateral LEs; supine to side to sit (mat) SBA with difficulty UEs; rolling (mat) SBA X 4; bilateral LE AROM/strengthening in supine X 15- ankle pumps, heel slides, hip abduction, AA SLRs with quad lag, SAQs, sit to stand X 2 (quad strengthening- pt unable to perform greater than 2 reps secondary to fatigue); up/down 2 steps 2 X 2 reps min assist; Nustep Level 3 X 10 minutes LEs only.    Derenda Giddings,JIM 12/12/2012, 7:41 AM

## 2012-12-12 NOTE — Progress Notes (Signed)
Called [employer] and spoke to Jennette and his Andoderm 4 mg/ 24 hr Patches have been approved  From 11/10/12 to 12/09/13 Case ID [id number]. Called [facility].

## 2012-12-12 NOTE — Progress Notes (Signed)
Physical Therapy Note  Patient Details  Name: Timothy [name] MRN: [id number] Date of Birth: 1946/07/21 Today's Date: 12/12/2012  15:00.  Patient unable to complete all scheduled therapy this afternoon due to severe fatigue.  Patient missed 30 minutes of OT.     Norton Pastel 12/12/2012, 3:21 PM

## 2012-12-12 NOTE — Progress Notes (Signed)
Subjective/Complaints: Still with diarrhea, feel sstronger No other complaints  Objective: Vital Signs: Blood pressure 105/69, pulse 78, temperature 97.5 F (36.4 C), temperature source Oral, resp. rate 17, height 5\' 7"  (1.702 m), weight 182 lb 8.7 oz (82.8 kg), SpO2 94.00%.   Basename 12/12/12 0700 12/11/12 0712  NA 133* 134*  K 3.7 3.9  CL 107 107  GLUCOSE 75 70  BUN 22 19  CREATININE 1.21 1.18  CALCIUM 7.2* 7.2*   CBG (last 3)  No results found for this basename: GLUCAP:3 in the last 72 hours  Wt Readings from Last 3 Encounters:  12/12/12 182 lb 8.7 oz (82.8 kg)  12/05/12 185 lb 3 oz (84 kg)  12/03/12 190 lb 4.1 oz (86.3 kg)    Physical Exam:   chronically ii appearing male in no acute distress. HEENT exam atraumatic, normocephalic, neck supple without jugular venous distention. Chest clear to auscultation cardiac exam S1-S2 are regular. Affect normal    Assessment/Plan: 1. Functional deficits secondary to deconditioning related CLL recent bone marrow transplant and multiple medical conditions  Medical Problem List and Plan:  1. DVT Prophylaxis/Anticoagulation: Pharmaceutical: Lovenox  2. Pain Management: Acetaminophen  3. Mood: Zoloft ego support- affect seems great 4. CLL/bone marrow transplant/: mgt per oncology- reviewed dr. Darrall Dears note 5. GVHD:  Per heme onc, immunosuppresants/antifungals 6. HTN BP Readings from Last 3 Encounters:  12/12/12 105/69  12/09/12 158/91  12/03/12 117/58   7. CKD: regular BMET's, monitor I's and O's, weights. 8. Afib: rate controlled at present. Follow for activity tolerance 9. Steroid induced DM: follow CBG's, SSI as needed 10. Pancytopenia: serial CBC's. Close monitoring of clinical status, no active infection signs at present. Counts appear relatively stable - reviewed labs 12/12/2012  11. GI: diarrhea, N/V- multifactorial-- likely primary problem is GVHD 12. Mood: zoloft      LOS (Days) 3 A FACE TO FACE EVALUATION  WAS PERFORMED  [facility] 12/12/2012 8:39 AM

## 2012-12-12 NOTE — Progress Notes (Signed)
Multi loose stools. PRN lomotil given at 2039, 0108,  And 0311. Imodium given at 0042 per patient's request. Timothy [name]

## 2012-12-12 NOTE — Progress Notes (Signed)
Occupational Therapy Note  Patient Details  Name: Timothy [name] MRN: [id number] Date of Birth: June 06, 1946 Today's Date: 12/12/2012  Time In:  09:00 Time Out:  10:00.  Individual session, no pain.  ADL retraining at sink level (pt declined shower stating he was too fatigued- pt very fragile medically) with emphasis on bed mobility, bed to wheelchair transfers, sit to stand, static and dynamic standing balance, increasing activity tolerance, safety, energy conservation, use of AE in bathing and dressing (pt with significant swelling in LE's making it difficult to bend over).  Patient able to recall how to use sock donner and was able to use long handled reacher to don underwear with min vc's.  Patient states he has lost over 100 pounds so his wife is buying him clothes and bringing them this afternoon to the hospital (pt did not have any pants to don this morning). Patient very fatigued at end of session - assisted patient back to bed. Phone, call light and urinal within reach.   Norton Pastel 12/12/2012, 11:54 AM

## 2012-12-13 ENCOUNTER — Inpatient Hospital Stay (HOSPITAL_COMMUNITY): Payer: BC Managed Care – PPO | Admitting: Occupational Therapy

## 2012-12-13 ENCOUNTER — Inpatient Hospital Stay (HOSPITAL_COMMUNITY): Payer: BC Managed Care – PPO

## 2012-12-13 ENCOUNTER — Inpatient Hospital Stay (HOSPITAL_COMMUNITY): Payer: BC Managed Care – PPO | Admitting: Physical Therapy

## 2012-12-13 DIAGNOSIS — G8929 Other chronic pain: Secondary | ICD-10-CM

## 2012-12-13 DIAGNOSIS — D89811 Chronic graft-versus-host disease: Secondary | ICD-10-CM

## 2012-12-13 DIAGNOSIS — M549 Dorsalgia, unspecified: Secondary | ICD-10-CM

## 2012-12-13 DIAGNOSIS — R5381 Other malaise: Secondary | ICD-10-CM

## 2012-12-13 LAB — COMPREHENSIVE METABOLIC PANEL
Albumin: 1.4 g/dL — ABNORMAL LOW (ref 3.5–5.2)
BUN: 24 mg/dL — ABNORMAL HIGH (ref 6–23)
CO2: 16 mEq/L — ABNORMAL LOW (ref 19–32)
Calcium: 7.3 mg/dL — ABNORMAL LOW (ref 8.4–10.5)
Chloride: 107 mEq/L (ref 96–112)
Creatinine, Ser: 1.11 mg/dL (ref 0.50–1.35)
GFR calc non Af Amer: 67 mL/min — ABNORMAL LOW (ref >90)
Total Bilirubin: 0.2 mg/dL — ABNORMAL LOW (ref 0.3–1.2)

## 2012-12-13 LAB — CBC WITH DIFFERENTIAL/PLATELET
Basophils Relative: 0 % (ref 0–1)
Eosinophils Relative: 0 % (ref 0–5)
HCT: 31.6 % — ABNORMAL LOW (ref 39.0–52.0)
Hemoglobin: 11.2 g/dL — ABNORMAL LOW (ref 13.0–17.0)
MCH: 29.8 pg (ref 26.0–34.0)
MCHC: 35.4 g/dL (ref 30.0–36.0)
MCV: 84 fL (ref 78.0–100.0)
Monocytes Absolute: 0.4 10*3/uL (ref 0.1–1.0)
Monocytes Relative: 9 % (ref 3–12)
Neutro Abs: 1.7 10*3/uL (ref 1.7–7.7)
RDW: 16.7 % — ABNORMAL HIGH (ref 11.5–15.5)

## 2012-12-13 LAB — MAGNESIUM: Magnesium: 1.5 mg/dL (ref 1.5–2.5)

## 2012-12-13 LAB — PHOSPHORUS: Phosphorus: 2.9 mg/dL (ref 2.3–4.6)

## 2012-12-13 MED ORDER — HYDROCODONE-ACETAMINOPHEN 10-325 MG PO TABS
1.0000 | ORAL_TABLET | Freq: Every day | ORAL | Status: DC
  Administered 2012-12-14 – 2012-12-29 (×15): 1 via ORAL
  Filled 2012-12-13 (×15): qty 1

## 2012-12-13 MED ORDER — HYDROCODONE-ACETAMINOPHEN 5-325 MG PO TABS
1.0000 | ORAL_TABLET | ORAL | Status: DC | PRN
  Administered 2012-12-18 – 2012-12-28 (×2): 1 via ORAL
  Filled 2012-12-13 (×2): qty 1

## 2012-12-13 MED ORDER — PROCHLORPERAZINE MALEATE 5 MG PO TABS
5.0000 mg | ORAL_TABLET | Freq: Three times a day (TID) | ORAL | Status: DC
  Administered 2012-12-14 – 2012-12-29 (×47): 5 mg via ORAL
  Filled 2012-12-13 (×50): qty 1

## 2012-12-13 NOTE — Progress Notes (Signed)
Physical Therapy Note  Patient Details  Name: Timothy [name] MRN: [id number] Date of Birth: September 15, 1946 Today's Date: 12/13/2012  1000-1030 (30 minutes) individual (pt missed 30 minutes secondary to nausea) Pain: no reported pain Focus of treatment: Bilateral LE AROM/strengthening Treatment: Pt in bed upon arrival. States he is nauseated (nursing aware) and requests to stay in bed but agreed to bedside exercises; bilateral LE strengthening exercises X 20 - ankle pumps, hip flexion , hip abductions, SAQs. .   1530-1600 (30 minutes) individual Pain: no reported pain/ no c/o nausea in PM Focus of treatment : bed mobility training; bilateral LE strengthening focused on up/down steps Treatment: supine to sit (mat or bed) without bedrails min assist trunk; sit to supine (mat ) SBA with difficulty LEs; sit to supine (bed) mod assist bilateral LEs; up /down 2 steps X 2 with one rail sideways min assist first trial/ mod assist second trial; up/down 4 inch step alternating LEs with RW support 2 X 10.   [name],JIM 12/13/2012, 7:46 AM

## 2012-12-13 NOTE — Progress Notes (Signed)
Timothy [name]   DOB:1945-12-30   AV#:[id number]   NWG#:[id number]  Subjective: Timothy [name] "worked hard" today. He feels he is making progress. He continues to have loose BMs shortly after meals--he is sitting on the [facility] after eating so there are no "accidents". That is working for him. The BMs are normal in color, slightly more formed, and scant. He has had some episodes of nausea, and some retching. He had not had fever, rash, bleeding, or mouth problems. He has pain when he gets in and out fo bed, not when he lies, sits or walks--once he gets to his new position he's fine. A detailed reiew of systems was otherwise stable   Objective: middle aged white male examined in bed Filed Vitals:   12/13/12 1703  BP: 110/72  Pulse: 73  Temp:   Resp:     Body mass index is 28.59 kg/(m^2).  Intake/Output Summary (Last 24 hours) at 12/13/12 1715 Last data filed at 12/13/12 1400  Gross per 24 hour  Intake    580 ml  Output      0 ml  Net    580 ml     Sclerae unicteric  Oropharynx entirely clear  No peripheral adenopathy  Lungs clear -- no rales or rhonchi  Heart regular rate and rhythm, no murmur appreciated  Abdomen soft, NT, +BS, no masses palpated  MSK no focal spinal tenderness, bilateral LE edema as beforeedema  Neuro nonfocal, A&O x3, positive affect   CBG (last 3)  No results found for this basename: GLUCAP:3 in the last 72 hours   Labs:  Lab Results  Component Value Date   WBC 4.9 12/13/2012   HGB 11.2* 12/13/2012   HCT 31.6* 12/13/2012   MCV 84.0 12/13/2012   PLT 72* 12/13/2012   NEUTROABS 1.7 12/13/2012    @LASTCHEMISTRY @  Urine Studies No results found for this basename: UACOL:2,UAPR:2,USPG:2,UPH:2,UTP:2,UGL:2,UKET:2,UBIL:2,UHGB:2,UNIT:2,UROB:2,ULEU:2,UEPI:2,UWBC:2,URBC:2,UBAC:2,CAST:2,CRYS:2,UCOM:2,BILUA:2 in the last 72 hours  Basic Metabolic Panel:  Lab 12/13/12 5784 12/13/12 0612 12/12/12 0700 12/11/12 0712 12/10/12 0535 12/09/12 0545 12/07/12 0530  NA -- 133* 133*  134* 134* 132* --  K -- 3.7 3.7 -- -- -- --  CL -- 107 107 107 109 109 --  CO2 -- 16* 15* 17* 18* 19 --  GLUCOSE -- 91 75 70 82 77 --  BUN -- 24* 22 19 18 19  --  CREATININE -- 1.11 1.21 1.18 1.04 1.03 --  CALCIUM -- 7.3* 7.2* 7.2* 7.3* 6.7* --  MG 1.5 -- -- -- -- -- 1.6  PHOS 2.9 -- -- -- -- -- 2.7   GFR Estimated Creatinine Clearance: 67.4 ml/min (by C-G formula based on Cr of 1.11). Liver Function Tests:  Lab 12/13/12 0612 12/12/12 0700 12/11/12 0712 12/10/12 0535 12/09/12 0545  AST 20 18 18 16 13   ALT 16 15 14 13 11   ALKPHOS 77 65 67 68 63  BILITOT 0.2* 0.2* 0.2* 0.2* 0.2*  PROT 3.2* 3.2* 3.1* 3.3* 3.3*  ALBUMIN 1.4* 1.3* 1.3* 1.4* 1.3*   No results found for this basename: LIPASE:5,AMYLASE:5 in the last 168 hours No results found for this basename: AMMONIA:5 in the last 168 hours Coagulation profile No results found for this basename: INR:5,PROTIME:5 in the last 168 hours  CBC:  Lab 12/13/12 0612 12/12/12 0700 12/11/12 0712 12/10/12 0535 12/09/12 1641 12/09/12 0545  WBC 4.9 4.5 4.8 5.2 4.6 --  NEUTROABS 1.7 1.0* 1.7 2.8 -- 2.9  HGB 11.2* 10.3* 10.9* 10.6* 10.8* --  HCT 31.6* 28.8* 30.2* 29.5* 30.2* --  MCV 84.0 84.0 84.1 84.0 84.1 --  PLT 72* 68* 70* 66* 72* --   Cardiac Enzymes: No results found for this basename: CKTOTAL:5,CKMB:5,CKMBINDEX:5,TROPONINI:5 in the last 168 hours BNP: No components found with this basename: POCBNP:5 CBG: No results found for this basename: GLUCAP:5 in the last 168 hours D-Dimer No results found for this basename: DDIMER:2 in the last 72 hours Hgb A1c No results found for this basename: HGBA1C:2 in the last 72 hours Lipid Profile No results found for this basename: CHOL:2,HDL:2,LDLCALC:2,TRIG:2,CHOLHDL:2,LDLDIRECT:2 in the last 72 hours Thyroid function studies No results found for this basename: TSH,T4TOTAL,FREET3,T3FREE,THYROIDAB in the last 72 hours Anemia work up No results found for this basename:  VITAMINB12:2,FOLATE:2,FERRITIN:2,TIBC:2,IRON:2,RETICCTPCT:2 in the last 72 hours Microbiology Recent Results (from the past 240 hour(s))  CLOSTRIDIUM DIFFICILE BY PCR     Status: Normal   Collection Time   12/06/12  2:30 AM      Component Value Range Status Comment   C difficile by pcr NEGATIVE  NEGATIVE Final       Studies:  No results found.  Assessment: 66 y.o. [place] man with a history of well-differentiated lymphocytic lymphoma/ chronic lymphoid leukemia initially diagnosed in 2000, not requiring intervention until 2006; with multiple chromosomal abnormalities; s/p allogeneic transplant March 2013 at "the [facility]", now admitted with GVHD. His treatment history is as follows:  (1) fludarabine/cyclophosphamide/rituximab x5 completed May 2007.  (2) rituximab for 8 doses October 2010, with partial response  (3) Leustatin and ofatumumab weekly x8 July to September 2011 followed by maintenance ofatumumab maintenance ofatumumab every 2 months, with initial response but rising counts September 2012  (4) status-post unrelated donor stem-cell transplant 02/24/2012 at the [facility] in [place] (a) conditioning regimen consisted of fludarabine + TBI at 200 cGy, followed by rituximab x27;  (b) CMV reactivation x3 (patient CMV positive, donor negative), s/p ganciclovir treatment; 3d reactivation August 2013, s/p gancyclovir, with negative PCR mid-September 2013; last gancyclovir dose 10/06/2012  (c) GVHD: involving gut and skin, treated with steroids, tacrolimus and MMF initially, MMF eventually d/c'd and tacrolimus tapered  (d) atrial fibrillation: resolved on brief amiodarone regimen (e) steroid-induced myopathy  (f) hypomagnesemia: resolved with discontinuation of gancyclovir  (g) hypogammaglobulinemia: s/p IVIG most recently 12/03/2012  (h) elevated triglycerides (606 on 07/14/2012)  (i) adrenal insufficiency: on prednisone and budesonide (j) pancytopenia; off growth factors currently (5)  restaging studies September 2013 including CT scans, flow cytometry, and bone marrow biopsy, showed no evidence of residual chronic lymphoid leukemia.  (6) recurrent GVHD (skin rash, mouth changes, severe diarrhea and gastric/duodenal/colonic biopsies 11/17/2012 c/w GVHD grade 2) now down to grade 1 (BMs better formed, 2-3/24h, no rash, no chronic skin changes, no mouth changes)  (7) severe malnutrition -- on supplements (8) testosterone deficiency--being supplemented  (9) deconditioning: REHAB (10) mild dehydration: encouraged increased po fluids; may need IVF support intermittently (11) low back pain with movement: will obtain plain LSS films for possible compression fracture; agree with vicodin, will write for one w breakfast to "get him OOB" in the AM (12) nausea: avoid reglan, doubt zofran would help; will write for low-dose compazine TIDac     Plan:see 6-12 above. Once patient gets maximal benefit from Rehab I have encouraged him to plan a return to [facility] for further re-evaluation. Full code   Lowella Dell 12/13/2012

## 2012-12-13 NOTE — Progress Notes (Signed)
Patient ID: Timothy [name], male   DOB: 1946/10/11, 66 y.o.   MRN: [id number] Subjective/Complaints: Nausea but no vomiting.  Improved with compazine. Diarrhea seems to be decreasing  Objective: Vital Signs: Blood pressure 119/57, pulse 76, temperature 97.5 F (36.4 C), temperature source Oral, resp. rate 18, height 5\' 7"  (1.702 m), weight 82.8 kg (182 lb 8.7 oz), SpO2 94.00%. No results found.  Basename 12/13/12 0612 12/12/12 0700  WBC 4.9 4.5  HGB 11.2* 10.3*  HCT 31.6* 28.8*  PLT 72* 68*    Basename 12/13/12 0612 12/12/12 0700  NA 133* 133*  K 3.7 3.7  CL 107 107  GLUCOSE 91 75  BUN 24* 22  CREATININE 1.11 1.21  CALCIUM 7.3* 7.2*   CBG (last 3)  No results found for this basename: GLUCAP:3 in the last 72 hours  Wt Readings from Last 3 Encounters:  12/12/12 82.8 kg (182 lb 8.7 oz)  12/05/12 84 kg (185 lb 3 oz)  12/03/12 86.3 kg (190 lb 4.1 oz)    Physical Exam:  Constitutional: He is oriented to person, place, and time. He appears well-developed. pale HENT:  Head: Normocephalic and atraumatic.  Right Ear: External ear normal.  Left Ear: External ear normal.  Nose: Nose normal.  Mouth/Throat: Oropharynx is clear and moist.  Eyes: Conjunctivae normal and EOM are normal. Pupils are equal, round, and reactive to light.  Neck: Normal range of motion. Neck supple.  Cardiovascular: Normal rate, regular rhythm, normal heart sounds and intact distal pulses.  Respiratory: Effort normal and breath sounds normal. No respiratory distress. He has no wheezes. He has no rales. He exhibits no tenderness.  GI: Soft. Bowel sounds are scarce. He exhibits no distension. There is tenderness. There is no rebound and no guarding.  Genitourinary: Penis normal.  Musculoskeletal: Normal range of motion.  2+ edema bilateral pretibial 3+ bilateral feet  Neurological: He is alert and oriented to person, place, and time. He has normal reflexes. He displays no tremor. He exhibits normal muscle  tone. Gait abnormal.  Motor strength is 4/5 in bilateral deltoid, biceps, triceps, grip 3 minus/5 in bilateral hip flexors knee extensors 4/5 bilateral ankle dorsiflexor plantar flexor some pain in addition with proximal leg movements Skin: Skin is warm and dry.  Psychiatric: He has a normal mood and affect. His behavior is normal. Judgment and thought content normal.    Assessment/Plan: 1. Functional deficits secondary to deconditioning related CLL recent bone marrow transplant and multiple medical conditions which require 3+ hours per day of interdisciplinary therapy in a comprehensive inpatient rehab setting. Physiatrist is providing close team supervision and 24 hour management of active medical problems listed below. Physiatrist and rehab team continue to assess barriers to discharge/monitor patient progress toward functional and medical goals. FIM: FIM - Bathing Bathing Steps Patient Completed: Chest;Right Arm;Left Arm;Abdomen;Front perineal area;Buttocks;Right upper leg;Left upper leg Bathing: 4: Min-Patient completes 8-9 36f 10 parts or 75+ percent  FIM - Upper Body Dressing/Undressing Upper body dressing/undressing steps patient completed: Thread/unthread right sleeve of pullover shirt/dresss;Thread/unthread left sleeve of pullover shirt/dress;Put head through opening of pull over shirt/dress;Pull shirt over trunk Upper body dressing/undressing: 5: Set-up assist to: Obtain clothing/put away FIM - Lower Body Dressing/Undressing Lower body dressing/undressing steps patient completed: Thread/unthread right underwear leg;Thread/unthread left underwear leg;Pull underwear up/down;Don/Doff right sock;Don/Doff left sock (pt w/o pants but did don underwear with reacher) Lower body dressing/undressing: 3: Mod-Patient completed 50-74% of tasks  FIM - Toileting Toileting steps completed by patient: Adjust clothing prior to toileting;Performs  perineal hygiene;Adjust clothing after  toileting Toileting: 5: Supervision: Safety issues/verbal cues  FIM - Diplomatic Services operational officer Devices: Bedside commode Toilet Transfers: 0-Activity did not occur  FIM - Banker Devices: Bed rails;Walker Bed/Chair Transfer: 5: Chair or W/C > Bed: Supervision (verbal cues/safety issues);5: Bed > Chair or W/C: Supervision (verbal cues/safety issues)  FIM - Locomotion: Wheelchair Distance: 50 with verbal cues for UE propulsion sequence for changes in direction; patient fatigued very quickly Locomotion: Wheelchair: 2: Travels 50 - 149 ft with supervision, cueing or coaxing FIM - Locomotion: Ambulation Locomotion: Ambulation Assistive Devices: Designer, industrial/product Ambulation/Gait Assistance: 4: Min assist Locomotion: Ambulation: 2: Travels 50 - 149 ft with minimal assistance (Pt.>75%)  Comprehension Comprehension Mode: Auditory Comprehension: 7-Follows complex conversation/direction: With no assist  Expression Expression Mode: Verbal Expression: 7-Expresses complex ideas: With no assist  Social Interaction Social Interaction: 7-Interacts appropriately with others - No medications needed.  Problem Solving Problem Solving: 7-Solves complex problems: Recognizes & self-corrects  Memory Memory: 7-Complete Independence: No helper Medical Problem List and Plan:  1. DVT Prophylaxis/Anticoagulation: Pharmaceutical: Lovenox  2. Pain Management: Acetaminophen  3. Mood: Zoloft ego support 4. CLL/bone marrow transplant/: mgt per oncology 5. GVHD:  Per heme onc, immunosuppresants/antifungals 6. HTN 7. CKD: regular BMET's, monitor I's and O's, weights. 8. Afib: rate controlled at present. Follow for activity tolerance 9. Steroid induced DM: follow CBG's, SSI as needed 10. Pancytopenia: serial CBC's. Close monitoring of clinical status, no active infection signs at present. Counts appear relatively stable today 11. GI: diarrhea, N/V,  multiple stools for C diff neg   -maintain fluids and electrolytes- BMET ok  -antidiarrheals, probiotic  -antiemetics, compazine and zofran 12. Mood: zoloft      LOS (Days) 4 A FACE TO FACE EVALUATION WAS PERFORMED  Erick Colace 12/13/2012 10:47 AM

## 2012-12-13 NOTE — Progress Notes (Signed)
Occupational Therapy Session Note  Patient Details  Name: Timothy [name] MRN: [id number] Date of Birth: 12/10/1946  Today's Date: 12/13/2012  Session 1 Time: 0800-0855 Time Calculation (min): 55 min  OT Short Term Goals Week 1:  OT Short Term Goal 1 (Week 1): Pt will perform toilet transfers on standard height toilet with supervision OT Short Term Goal 2 (Week 1): Pt will perform toileting with supervision OT Short Term Goal 3 (Week 1): Pt will perform LB dressing with supervision OT Short Term Goal 4 (Week 1): Pt will perform walk-in shower transfer with supervisoin  Skilled Therapeutic Interventions/Progress Updates:    Pt in bed resting upon arrival but agreeable to participating in bathing and dressing tasks w/c level at sink.  Pt requested to use toilet before bathing/dressing.  Pt amb with RW to [facility] in bathroom.  Pt amb with RW to sink for bathing and dressing tasks.  When patient stood at sink he had an incontinent bowel incident.  After completing bowel movement when patient was standing he c/o light headedness and nausea.  Pt assisted back to bed and RN attended to patient.  Focus on activity tolerance, functional amb with RW, safety awareness, and standing balance.  Therapy Documentation Precautions:  Precautions Precautions: Fall Precaution Comments: AFIB, chronic leukemia, immuno compromised, graft vs. host disease; LE edema, ace wrap and elevate LE Restrictions Weight Bearing Restrictions: No Pain:  Pt denies pain  See FIM for current functional status  Therapy/Group: Individual Therapy  Session 2 Time: 1330-1415 Pt denies pain Individual Therapy Pt in bed resting upon arrival.  Pt performed supine->sit EOB with supervision using bed rails.  Pt requested to use BSC in bathroom.  Pt amb with RW from room to ADL apartment to engage in home mgmt tasks and then transitioned to gym to practice sit<>stand from various heights without use of arm rests.  Pt exhibited  increased difficulty with lower heights approaching height of toilet at home.  Pt does not have grab bars at home.  Pt amb from gym to room without rest break.  At end of session pt stated that initially he did not want to do any therapy at all this afternoon but now he is glad he participated because he was able to do more than he anticipated.  Pt stated he is discouraged because there are a number of tasks he is not able to accomplish but he is also encouraged because he is able to accomplish more tasks than when he came to rehab.   Lavone Neri [facility] 12/13/2012, 3:20 PM

## 2012-12-13 NOTE — Progress Notes (Signed)
Occupational Therapy Session Note  Patient Details  Name: Timothy [name] MRN: [id number] Date of Birth: [birth year]  Today's Date: 12/13/2012 Time: 6213-0865 Time Calculation (min): 30 min  Short Term Goals:  STG=LTG  Skilled Therapeutic Interventions/Progress Updates:  Patient resting in bed upon arrival with pan next to bed due to nausea.  Patient agreed to minimal activity therefore engaged in bed mobility, sit EOB, sit><stand, standing tolerance & endurance.  Patient's BLEs ace wraps removed and re-applied to include handout placed above bed to instruct staff on technique for wrapping to reduce edema in BLEs.  1 set of 10 of 3 BUE theraband exercises while seated EOB.  Patient requested back to bed with BLEs elevated and boots reapplied.  Therapy Documentation Precautions:  Precautions Precautions: Fall Precaution Comments: AFIB, chronic leukemia, immuno compromised, graft vs. host disease; LE edema, ace wrap and elevate LE Restrictions Weight Bearing Restrictions: No Pain: Reports lower back pain with bed mobility, not rated, adapted technique to reduce pain.  Therapy/Group: Individual Therapy  Jaelee Laughter 12/13/2012, 12:32 PM

## 2012-12-13 NOTE — Patient Care Conference (Signed)
Inpatient RehabilitationTeam Conference and Plan of Care Update Date: 12/13/2012   Time: 2:35 PM    Patient Name: Timothy [name]      Medical Record Number: [id number]  Date of Birth: 1946-08-20 Sex: Male         Room/Bed: 4149/4149-01 Payor Info: Payor: BLUE CROSS BLUE SHIELD  Plan: [facility] HEALTH PPO  Product Type: *No Product type*     Admitting Diagnosis: DECONDITIONING MULTI MED  Admit Date/Time:  12/09/2012  1:43 PM Admission Comments: No comment available   Primary Diagnosis:  Physical deconditioning Principal Problem: Physical deconditioning  Patient Active Problem List   Diagnosis Date Noted  . Physical deconditioning 12/09/2012  . 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 Diarrhea in adult patient 12/05/2012  . CLL (chronic lymphocytic leukemia) 12/05/2012  . Rash of face 12/05/2012  . Diarrhea 08/24/2012  . Dehydration 08/24/2012  . Nausea and vomiting 08/24/2012  . Immunocompromised 08/24/2012  . 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 Diverticular disease   . Hyperlipidemia   . Obesity   . Hypertension   . Hiatal hernia   . CMV (cytomegalovirus) antibody positive   . Right bundle branch block   . CKD (chronic kidney disease) stage 2, GFR 60-89 ml/min   . Pancytopenia   . Steroid-induced diabetes   . Atrial fibrillation   . Myopathy   . Fine tremor   . Leukemia, chronic lymphoid 12/09/2011    Expected Discharge Date: Expected Discharge Date: 12/29/12  Team Members Present: Physician leading conference: Dr. Claudette Laws Social Worker Present: Amada Jupiter, LCSW Nurse Present: Daryll Brod, RN PT Present: [name], PT OT Present: Mackie Pai, OT;Patricia Mat Carne, OT     Current Status/Progress Goal Weekly Team Focus  Medical   vomiting improved, still with diarrhea  improve continence of bowel  Improve diarrhea   Bowel/Bladder   patient with chronic diarrhea;  had 2 loose stools  this AM; on lomotil and immodium prn for diarrhea; using BSC with help  stay continent of bowel/bladder  monitor and  assist as needed   Swallow/Nutrition/ Hydration             ADL's   supervision UB bathing and dressing; mod A LB bathing and dressing; steady A transfers  supervision/mod I overall  activity tolerance; fucntional amb for home mgmt tasks   Mobility   bed mobility supervision; transfers SBA RW; gait 120 feet RW SBA (performance variable depending on medical status; 2-3 steps min assist with one rail  modified independent transfers except car SBA; gait supervision 75 feet ; steps 12 min assist 1 rail  activity tolerance; steps for access to second floor   Communication             Safety/Cognition/ Behavioral Observations            Pain   patient denied pain at this time  remain free of pain  monitor   Skin   scattered bruising from sticks, redness to buttock d/t diarrhea-barrier cream in use  encourage patient  to turn/reposition  monitor    Rehab Goals Patient on target to meet rehab goals: Yes *See Interdisciplinary Assessment and Plan and progress notes for long and short-term goals  Barriers to Discharge: very poor endurance, diarrhea    Possible Resolutions to Barriers:  ask for palliative consult for symptom management    Discharge Planning/Teaching Needs:  home with wife who can provide assistance  Team Discussion:  Very fragile and weak.  During best sessions, pt can function close supervision level, however, legs can quickly buckle.  Concern over emotional adjustment and overall disease management.    Revisions to Treatment Plan:  Will request neuropsych consult as well as Palliative Care consult.   Continued Need for Acute Rehabilitation Level of Care: The patient requires daily medical management by a physician with specialized training in physical medicine and rehabilitation for the following conditions: Daily direction of a multidisciplinary  physical rehabilitation program to ensure safe treatment while eliciting the highest outcome that is of practical value to the patient.: Yes Daily medical management of patient stability for increased activity during participation in an intensive rehabilitation regime.: Yes Daily analysis of laboratory values and/or radiology reports with any subsequent need for medication adjustment of medical intervention for : Post surgical problems;Other  Kalyb Pemble 12/13/2012, 4:08 PM

## 2012-12-13 NOTE — Progress Notes (Signed)
Brief note, full consult to follow  66 yr old male with h/o  chronic lymphoid leukemia, had bone marrow transplant in [place] and developed GVHD causing nausea, vomiting and diarrhea. Patient also complains of back pain 8/10 in intensity.    Filed Vitals:   12/13/12 1703  BP: 110/72  Pulse: 73  Temp:   Resp:     Chest: Clear Bilaterally Heart : S1S2 RRR Abdomen: Soft, nontender Ext : No edema Neuro: Alert, oriented x 3  A/P Vomiting, diarrhea Back pain  Patient has chronic diarrhea due to graft-versus-host disease, would consider getting GI consult to help with diarrhea. Discussed with Dr. Arlice Colt at bedside.  back pain :I will start the patient on Vicodin 5/ 500 one tablet by mouth every 4 hours when necessary. The side effect of Vicodin his constipation which may actually help the patient's diarrhea.    Meredeth Ide [facility]ist/Palliative Medicine Team [phone]

## 2012-12-13 NOTE — Progress Notes (Signed)
At 0020 lomotil given per patient's request. At 0332 PRN imodium given for loose/liquid stool. Continues with BLE edema. teds removed at HS per patient's request. Prevalon boots in place and legs elevated up on pillows. Alfredo Martinez A

## 2012-12-14 ENCOUNTER — Inpatient Hospital Stay (HOSPITAL_COMMUNITY): Payer: BC Managed Care – PPO | Admitting: Physical Therapy

## 2012-12-14 ENCOUNTER — Inpatient Hospital Stay (HOSPITAL_COMMUNITY): Payer: BC Managed Care – PPO

## 2012-12-14 ENCOUNTER — Inpatient Hospital Stay (HOSPITAL_COMMUNITY): Payer: BC Managed Care – PPO | Admitting: Occupational Therapy

## 2012-12-14 ENCOUNTER — Inpatient Hospital Stay (HOSPITAL_COMMUNITY): Payer: Self-pay | Admitting: Physical Therapy

## 2012-12-14 DIAGNOSIS — M549 Dorsalgia, unspecified: Secondary | ICD-10-CM

## 2012-12-14 DIAGNOSIS — G8929 Other chronic pain: Secondary | ICD-10-CM

## 2012-12-14 LAB — CBC WITH DIFFERENTIAL/PLATELET
Basophils Absolute: 0 10*3/uL (ref 0.0–0.1)
Basophils Relative: 0 % (ref 0–1)
Eosinophils Absolute: 0 10*3/uL (ref 0.0–0.7)
MCH: 29.6 pg (ref 26.0–34.0)
MCHC: 35.4 g/dL (ref 30.0–36.0)
Neutrophils Relative %: 26 % — ABNORMAL LOW (ref 43–77)
Platelets: 69 10*3/uL — ABNORMAL LOW (ref 150–400)

## 2012-12-14 LAB — COMPREHENSIVE METABOLIC PANEL
ALT: 17 U/L (ref 0–53)
AST: 23 U/L (ref 0–37)
Albumin: 1.4 g/dL — ABNORMAL LOW (ref 3.5–5.2)
Alkaline Phosphatase: 86 U/L (ref 39–117)
Potassium: 4.3 mEq/L (ref 3.5–5.1)
Sodium: 135 mEq/L (ref 135–145)
Total Protein: 3.4 g/dL — ABNORMAL LOW (ref 6.0–8.3)

## 2012-12-14 MED ORDER — DRONABINOL 2.5 MG PO CAPS
2.5000 mg | ORAL_CAPSULE | Freq: Two times a day (BID) | ORAL | Status: DC
  Administered 2012-12-14 – 2012-12-29 (×31): 2.5 mg via ORAL
  Filled 2012-12-14 (×32): qty 1

## 2012-12-14 MED ORDER — BENEPROTEIN PO POWD
1.0000 | Freq: Three times a day (TID) | ORAL | Status: DC
  Administered 2012-12-14 – 2012-12-29 (×30): 6 g via ORAL
  Filled 2012-12-14: qty 227

## 2012-12-14 MED ORDER — SODIUM CHLORIDE 0.45 % IV SOLN
INTRAVENOUS | Status: DC
  Administered 2012-12-14 – 2012-12-27 (×14): via INTRAVENOUS

## 2012-12-14 NOTE — Progress Notes (Signed)
Physical Therapy Session Note  Patient Details  Name: Timothy [name] MRN: [id number] Date of Birth: [birth year]  Today's Date: 12/14/2012 Time: 1135-1204 Time Calculation (min): 29 min  Short Term Goals: Week 1:  PT Short Term Goal 1 (Week 1): Patient will consistently perform bed mobility and bed <> w/c transfers with min A, RW with no episodes of LE giving out PT Short Term Goal 2 (Week 1): Patient will perform w/c mobility in controlled environment for endurance x 150' with supervision and one rest break PT Short Term Goal 3 (Week 1): Patient will perform gait in controlled environment x 50' with LRAD and min A PT Short Term Goal 4 (Week 1): Patient will perform up and down 3-5 stairs with two rails and mod A  Skilled Therapeutic Interventions/Progress Updates:    This session focused on bed mobility requiring mod assist to get to sitting today (pt reports not feeling well nauseated and very fatigued) to help pt pull trunk to upright with arms.  Sit to supine later was also mod assist due to needing help with both legs back into the bed.  Standing up to RW min guard assist due to weak legs and often needing extra tries to get there from lower surfaces like his WC.  Verbal cues for safe hand placement.  Stand pivot transfers with RW from bed to [facility] supervision.  Gait 200' with RW supervision with WC to follow to try to encourage increased gait distance.  Walked over both carpeted and tiled surfaces.  Verbal cues for upright posture and to stay closer to RW as he fatigued.  Stairs 3, 4" steps x 2 reps with left railing sideways min assist to support trunk while stepping up/down.  Despite initial reluctance to work with PT due to nausea, pt did very well.    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 Miasia Crabtree, PT, DPT [phone]   12/14/2012, 12:30 PM

## 2012-12-14 NOTE — Progress Notes (Signed)
Occupational Therapy Session Note  Patient Details  Name: Timothy [name] MRN: [id number] Date of Birth: 1946/05/05  Today's Date: 12/14/2012 Time: 0800-0900 Time Calculation (min): 60 min  Short Term Goals: Week 1:  OT Short Term Goal 1 (Week 1): Pt will perform toilet transfers on standard height toilet with supervision OT Short Term Goal 2 (Week 1): Pt will perform toileting with supervision OT Short Term Goal 3 (Week 1): Pt will perform LB dressing with supervision OT Short Term Goal 4 (Week 1): Pt will perform walk-in shower transfer with supervisoin  Skilled Therapeutic Interventions/Progress Updates:    Pt in bed upon arrival but agreeable to engaging in bathing and dressing tasks w/c level at sink.  Pt initially amb with RW to [facility] in bathroom in case he needed to have a bowel movement.  Pt amb with RW to select clothing before sitting down in w/c at sink.  After completing grooming tasks pt stated he only wanted to change clothing and not bathe this morning.  Pt stated he was very fatigued and did not sleep well previous night secondary to having to get up a few times for bowel movements.  After donning clothing pt stated he needed to have a bowel movement and amb with RW to bathroom.  At completion of session pt stated needed to lay back in bed because he was exhausted.  After sitting EOB pt c/o nausea.  RN notified.  Assisted pt with sit>supine and donned Prevalon boots. Ace wrapping not applied to BLE - RN notified.  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

## 2012-12-14 NOTE — Progress Notes (Signed)
Patient ID: Timothy [name], male   DOB: 1946/01/14, 66 y.o.   MRN: [id number] Subjective/Complaints: Nausea but no vomiting.  Improved with compazine.Now scheduled, Diarrhea stable Poor appetite Review of Systems  Constitutional: Positive for weight loss and malaise/fatigue.  Gastrointestinal: Positive for nausea and diarrhea. Negative for abdominal pain.  Neurological: Positive for weakness.  All other systems reviewed and are negative.    Objective: Vital Signs: Blood pressure 148/88, pulse 67, temperature 98.4 F (36.9 C), temperature source Oral, resp. rate 17, height 5\' 7"  (1.702 m), weight 82.8 kg (182 lb 8.7 oz), SpO2 96.00%. No results found.  Basename 12/14/12 0422 12/13/12 0612  WBC 5.1 4.9  HGB 10.3* 11.2*  HCT 29.1* 31.6*  PLT 69* 72*    Basename 12/14/12 0422 12/13/12 0612  NA 135 133*  K 4.3 3.7  CL 108 107  GLUCOSE 91 91  BUN 27* 24*  CREATININE 1.20 1.11  CALCIUM 7.4* 7.3*   CBG (last 3)  No results found for this basename: GLUCAP:3 in the last 72 hours  Wt Readings from Last 3 Encounters:  12/12/12 82.8 kg (182 lb 8.7 oz)  12/05/12 84 kg (185 lb 3 oz)  12/03/12 86.3 kg (190 lb 4.1 oz)    Physical Exam:  Constitutional: He is oriented to person, place, and time. He appears well-developed. pale HENT:  Head: Normocephalic and atraumatic.  Right Ear: External ear normal.  Left Ear: External ear normal.  Nose: Nose normal.  Mouth/Throat: Oropharynx is clear and moist.  Eyes: Conjunctivae normal and EOM are normal. Pupils are equal, round, and reactive to light.  Neck: Normal range of motion. Neck supple.  Cardiovascular: Normal rate, regular rhythm, normal heart sounds and intact distal pulses.  Respiratory: Effort normal and breath sounds normal. No respiratory distress. He has no wheezes. He has no rales. He exhibits no tenderness.  GI: Soft. Bowel sounds are scarce. He exhibits no distension. There is tenderness. There is no rebound and no guarding.   Genitourinary: Penis normal.  Musculoskeletal: Normal range of motion.  2+ edema bilateral pretibial 3+ bilateral feet  Neurological: He is alert and oriented to person, place, and time. He has normal reflexes. He displays no tremor. He exhibits normal muscle tone. Gait abnormal.  Motor strength is 4/5 in bilateral deltoid, biceps, triceps, grip 4 minus/5 in bilateral hip flexors knee extensors 4/5 bilateral ankle dorsiflexor plantar flexor some pain in addition with proximal leg movements Skin: Skin is warm and dry.  Psychiatric: He has a normal mood and affect. His behavior is normal. Judgment and thought content normal.    Assessment/Plan: 1. Functional deficits secondary to deconditioning related CLL recent bone marrow transplant and multiple medical conditions which require 3+ hours per day of interdisciplinary therapy in a comprehensive inpatient rehab setting. Physiatrist is providing close team supervision and 24 hour management of active medical problems listed below. Physiatrist and rehab team continue to assess barriers to discharge/monitor patient progress toward functional and medical goals. FIM: FIM - Bathing Bathing Steps Patient Completed: Chest;Right Arm;Left Arm;Abdomen;Front perineal area;Buttocks;Right upper leg;Left upper leg Bathing: 0: Activity did not occur  FIM - Upper Body Dressing/Undressing Upper body dressing/undressing steps patient completed: Thread/unthread right sleeve of pullover shirt/dresss;Put head through opening of pull over shirt/dress;Thread/unthread left sleeve of pullover shirt/dress;Pull shirt over trunk Upper body dressing/undressing: 5: Set-up assist to: Obtain clothing/put away FIM - Lower Body Dressing/Undressing Lower body dressing/undressing steps patient completed: Thread/unthread right underwear leg;Thread/unthread left underwear leg;Pull underwear up/down;Pull pants up/down;Thread/unthread left pants  leg;Thread/unthread right pants  leg;Don/Doff left sock;Don/Doff right sock Lower body dressing/undressing: 4: Steadying Assist  FIM - Toileting Toileting steps completed by patient: Adjust clothing prior to toileting;Performs perineal hygiene;Adjust clothing after toileting Toileting: 5: Supervision: Safety issues/verbal cues  FIM - Archivist Transfers Assistive Devices: Bedside commode;Walker Toilet Transfers: 5-To toilet/BSC: Supervision (verbal cues/safety issues);5-From toilet/BSC: Supervision (verbal cues/safety issues)  FIM - Bed/Chair Transfer Bed/Chair Transfer Assistive Devices: Bed rails;Walker Bed/Chair Transfer: 5: Sit > Supine: Supervision (verbal cues/safety issues);4: Bed > Chair or W/C: Min A (steadying Pt. > 75%)  FIM - Locomotion: Wheelchair Distance: 50 with verbal cues for UE propulsion sequence for changes in direction; patient fatigued very quickly Locomotion: Wheelchair: 2: Travels 50 - 149 ft with supervision, cueing or coaxing FIM - Locomotion: Ambulation Locomotion: Ambulation Assistive Devices: Designer, industrial/product Ambulation/Gait Assistance: 4: Min assist Locomotion: Ambulation: 2: Travels 50 - 149 ft with minimal assistance (Pt.>75%)  Comprehension Comprehension Mode: Auditory Comprehension: 7-Follows complex conversation/direction: With no assist  Expression Expression Mode: Verbal Expression: 7-Expresses complex ideas: With no assist  Social Interaction Social Interaction: 7-Interacts appropriately with others - No medications needed.  Problem Solving Problem Solving: 7-Solves complex problems: Recognizes & self-corrects  Memory Memory: 7-Complete Independence: No helper Medical Problem List and Plan:  1. DVT Prophylaxis/Anticoagulation: Pharmaceutical: Lovenox  2. Pain Management: Acetaminophen  3. Mood: Zoloft ego support 4. CLL/bone marrow transplant/: mgt per oncology 5. GVHD:  Per heme onc, immunosuppresants/antifungals 6. HTN 7. CKD: regular BMET's,  monitor I's and O's, weights. 8. Afib: rate controlled at present. Follow for activity tolerance 9. Steroid induced DM: follow CBG's, SSI as needed 10. Pancytopenia: serial CBC's. Close monitoring of clinical status, no active infection signs at present. Counts appear relatively stable today 11. GI: diarrhea, N/V, multiple stools for C diff neg   -maintain fluids and electrolytes- BUN creeping up, supplement noctural IVF, monitor for increased edema Alb is low  -antidiarrheals, probiotic, has been evaluated by GI during this hospitalization already for diarrhea  -antiemetics, compazine 12. Mood: zoloft  12.  Anorexia add marinol    LOS (Days) 5 A FACE TO FACE EVALUATION WAS PERFORMED  Claudette Laws E 12/14/2012 10:11 AM

## 2012-12-14 NOTE — Progress Notes (Signed)
Occupational Therapy Session Note  Patient Details  Name: Timothy [name] MRN: [id number] Date of Birth: 04/19/1946  Today's Date: 12/14/2012 Time: 1300-1330 Time Calculation (min): 30 min  Short Term Goals: Week 1:  OT Short Term Goal 1 (Week 1): Pt will perform toilet transfers on standard height toilet with supervision OT Short Term Goal 2 (Week 1): Pt will perform toileting with supervision OT Short Term Goal 3 (Week 1): Pt will perform LB dressing with supervision OT Short Term Goal 4 (Week 1): Pt will perform walk-in shower transfer with supervisoin  Skilled Therapeutic Interventions/Progress Updates:  Patient resting in bed upon arrival and originally declined OT this PM reporting too fatigued.  After encouragement, patient agreed to perform bed mobility, ambulate with RW to bathroom, toilet, ambulate to sink then to w/c to sit up and visit with friend.  Patient overall supervision with all above tasks with focus on adaptive techniques, activity tolerance, balance and walker safety.  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  See FIM for current functional status  Therapy/Group: Individual Therapy  Benino Korinek 12/14/2012, 4:17 PM

## 2012-12-14 NOTE — Progress Notes (Signed)
Physical Therapy Session Note  Patient Details  Name: Trung Wenzl MRN: [id number] Date of Birth: 09-29-1946  Today's Date: 12/14/2012 Time: 0930-1028 Time Calculation (min): 58 min (20 minutes lost due to nausea)  Skilled Therapeutic Interventions/Progress Updates:  Patient performed supine/sit transfers using bedrails with HOB elevated with supervision.  Sit/stand transfer from EOB to RW with min A.  Patient ambulated in controlled environment with RW and min A 150' with one standing rest break (following with WC for safety) x2 bouts with extended seated rest break between bouts.    Step-ups onto 6" step with RW for bilateral UE support x5 reps bilaterally with seated rest break between left and right legs.  After extended rest break patient performed 2 sets 10 reps step-ups onto 4" step with RW for bilateral UE support x10 reps bilaterally with seated rest break between left and right legs and between sets.  Close supervision for stand pivot transfer from [facility] to EOB; mod A to elevate bilateral LEs during sit/supine transfer.  Patient mod I to scoot to head of bed with bedrails and HOB lowered at end of session.   Pain: Pain Assessment Pain Assessment: Denies pain  See FIM for current functional status  Therapy/Group: Individual Therapy  Rexene Agent 12/14/2012, 12:09 PM

## 2012-12-14 NOTE — Progress Notes (Signed)
Patient was seen yesterday with Dr. Joyce Gross, no need for palliative to be involved at this time. We'll sign off consult  [name] again if needed

## 2012-12-14 NOTE — Progress Notes (Signed)
COURTESY VISIT:  I will be out of town until Jan 2d. If there are issues please consult my partners, or, you may contact the [facility] in [place] directly at  [phone]   [facility] MD

## 2012-12-14 NOTE — Progress Notes (Signed)
Social Work Patient ID: Timothy [name], male   DOB: [birth year], 66 y.o.   MRN: [id number]  Have reviewed team conference with patient and wife - both aware and agreeable with targeted d/c for 12/29/12 and for supervision to mod i goals.  Both also aware of discussion about Palliative Care Consult - Mackie Pai, OT met with both of them yesterday to discuss this.  Wife notes today that, while both are aware of option for this service, she does not think they will pursue it at this time.  She also notes that they are likely going to return to [facility] in [place] fairly soon after this hospital d/c.   Team will continue to address endurance and strength as well as provide emotional support as needed.  Marilynn Ekstein, LCSW

## 2012-12-14 NOTE — Progress Notes (Signed)
Physical Therapy Note  Patient Details  Name: Timothy [name] MRN: [id number] Date of Birth: 1946-10-26 Today's Date: 12/14/2012  Patient taken down for x-ray at beginning of scheduled PT session; upon return patient declined therapy due to extreme fatigue.  Patient missed 45 minutes skilled PT therapy.  Newman Pies A 12/14/2012, 2:15 PM

## 2012-12-15 MED ORDER — TACROLIMUS 0.5 MG PO CAPS
1.5000 mg | ORAL_CAPSULE | Freq: Two times a day (BID) | ORAL | Status: DC
  Administered 2012-12-15 – 2012-12-29 (×28): 1.5 mg via ORAL
  Filled 2012-12-15 (×7): qty 3

## 2012-12-15 NOTE — Progress Notes (Signed)
Patient ID: Timothy [name], male   DOB: 1946/10/09, 66 y.o.   MRN: [id number] Subjective/Complaints: Nausea but no vomiting.  Improved with compazine.Now scheduled, Diarrhea stable Poor appetite Review of Systems  Constitutional: Positive for weight loss and malaise/fatigue.  Gastrointestinal: Positive for nausea and diarrhea. Negative for abdominal pain.  Neurological: Positive for weakness.  All other systems reviewed and are negative.    Objective: Vital Signs: Blood pressure 107/73, pulse 75, temperature 98.3 F (36.8 C), temperature source Oral, resp. rate 19, height 5\' 7"  (1.702 m), weight 82.5 kg (181 lb 14.1 oz), SpO2 97.00%. Dg Lumbar Spine 2-3 Views  12/14/2012  *RADIOLOGY REPORT*  Clinical Data:   66 year old male low back pain.  No known injury.  LUMBAR SPINE - 2-3 VIEW  Comparison: CT abdomen and pelvis 09/02/2012.  Findings: Normal lumbar segmentation.  Moderate T12 compression fracture is stable to slightly increased since September.  There is a new mild to moderate L3 compression deformity since that time, primarily involving the superior endplate.  Other lumbar levels appear stable and intact.  Grossly intact sacrum.  There are other thoracic compression fractures, not well evaluated on this study.  IMPRESSION: 1.  Mild to moderate L3 compression fracture is new since September, favor acute/subacute in this setting. If specific therapy such as vertebroplasty is desired, MRI or whole body nuclear medicine bone scan would best confirm acuity. 2.  T12 compression fracture stable to slightly increased since September.  There are other lower thoracic compression fractures not well evaluated on these images.   Original Report Authenticated By: Erskine Speed, M.D.     Basename 12/14/12 0422 12/13/12 0612  WBC 5.1 4.9  HGB 10.3* 11.2*  HCT 29.1* 31.6*  PLT 69* 72*    Basename 12/14/12 0422 12/13/12 0612  NA 135 133*  K 4.3 3.7  CL 108 107  GLUCOSE 91 91  BUN 27* 24*  CREATININE  1.20 1.11  CALCIUM 7.4* 7.3*   CBG (last 3)  No results found for this basename: GLUCAP:3 in the last 72 hours  Wt Readings from Last 3 Encounters:  12/15/12 82.5 kg (181 lb 14.1 oz)  12/05/12 84 kg (185 lb 3 oz)  12/03/12 86.3 kg (190 lb 4.1 oz)    Physical Exam:  Constitutional: He is oriented to person, place, and time. He appears well-developed. pale HENT:  Head: Normocephalic and atraumatic.  Right Ear: External ear normal.  Left Ear: External ear normal.  Nose: Nose normal.  Mouth/Throat: Oropharynx is clear and moist.  Eyes: Conjunctivae normal and EOM are normal. Pupils are equal, round, and reactive to light.  Neck: Normal range of motion. Neck supple.  Cardiovascular: Normal rate, regular rhythm, normal heart sounds and intact distal pulses.  Respiratory: Effort normal and breath sounds normal. No respiratory distress. He has no wheezes. He has no rales. He exhibits no tenderness.  GI: Soft. Bowel sounds are scarce. He exhibits no distension. There is tenderness. There is no rebound and no guarding.  Genitourinary: Penis normal.  Musculoskeletal: Normal range of motion.  2+ edema bilateral pretibial 3+ bilateral feet  Neurological: He is alert and oriented to person, place, and time. He has normal reflexes. He displays no tremor. He exhibits normal muscle tone. Gait abnormal.  Motor strength is 4/5 in bilateral deltoid, biceps, triceps, grip 4 minus/5 in bilateral hip flexors knee extensors 4/5 bilateral ankle dorsiflexor plantar flexor some pain in addition with proximal leg movements Skin: Skin is warm and dry.  Psychiatric: He has  a normal mood and affect. His behavior is normal. Judgment and thought content normal.    Assessment/Plan: 1. Functional deficits secondary to deconditioning related CLL recent bone marrow transplant and multiple medical conditions which require 3+ hours per day of interdisciplinary therapy in a comprehensive inpatient rehab  setting. Physiatrist is providing close team supervision and 24 hour management of active medical problems listed below. Physiatrist and rehab team continue to assess barriers to discharge/monitor patient progress toward functional and medical goals. FIM: FIM - Bathing Bathing Steps Patient Completed: Chest;Right Arm;Left Arm;Abdomen;Front perineal area;Buttocks;Right upper leg;Left upper leg Bathing: 0: Activity did not occur  FIM - Upper Body Dressing/Undressing Upper body dressing/undressing steps patient completed: Thread/unthread right sleeve of pullover shirt/dresss;Put head through opening of pull over shirt/dress;Thread/unthread left sleeve of pullover shirt/dress;Pull shirt over trunk Upper body dressing/undressing: 5: Set-up assist to: Obtain clothing/put away FIM - Lower Body Dressing/Undressing Lower body dressing/undressing steps patient completed: Thread/unthread right underwear leg;Thread/unthread left underwear leg;Pull underwear up/down;Pull pants up/down;Thread/unthread left pants leg;Thread/unthread right pants leg;Don/Doff left sock;Don/Doff right sock Lower body dressing/undressing: 4: Steadying Assist  FIM - Toileting Toileting steps completed by patient: Adjust clothing prior to toileting;Performs perineal hygiene;Adjust clothing after toileting Toileting: 5: Supervision: Safety issues/verbal cues  FIM - Archivist Transfers Assistive Devices: Bedside commode;Walker (located in bathroom) Toilet Transfers: 5-To toilet/BSC: Supervision (verbal cues/safety issues);5-From toilet/BSC: Supervision (verbal cues/safety issues)  FIM - Bed/Chair Transfer Bed/Chair Transfer Assistive Devices: Bed rails;HOB elevated;Arm rests;Walker Bed/Chair Transfer: 3: Sit > Supine: Mod A (lifting assist/Pt. 50-74%/lift 2 legs);3: Supine > Sit: Mod A (lifting assist/Pt. 50-74%/lift 2 legs;4: Chair or W/C > Bed: Min A (steadying Pt. > 75%);4: Bed > Chair or W/C: Min A (steadying Pt.  > 75%)  FIM - Locomotion: Wheelchair Distance: 150 Locomotion: Wheelchair: 5: Travels 150 ft or more: maneuvers on rugs and over door sills with supervision, cueing or coaxing FIM - Locomotion: Ambulation Locomotion: Ambulation Assistive Devices: Designer, industrial/product Ambulation/Gait Assistance: 5: Supervision Locomotion: Ambulation: 5: Travels 150 ft or more with supervision/safety issues  Comprehension Comprehension Mode: Auditory Comprehension: 7-Follows complex conversation/direction: With no assist  Expression Expression Mode: Verbal Expression: 7-Expresses complex ideas: With no assist  Social Interaction Social Interaction: 7-Interacts appropriately with others - No medications needed.  Problem Solving Problem Solving: 7-Solves complex problems: Recognizes & self-corrects  Memory Memory: 7-Complete Independence: No helper Medical Problem List and Plan:  1. DVT Prophylaxis/Anticoagulation: Pharmaceutical: Lovenox  2. Pain Management: Acetaminophen  3. Mood: Zoloft ego support 4. CLL/bone marrow transplant/: mgt per oncology 5. GVHD:  Per heme onc, immunosuppresants/antifungals 6. HTN 7. CKD: regular BMET's, monitor I's and O's, weights. 8. Afib: rate controlled at present. Follow for activity tolerance 9. Steroid induced DM: follow CBG's, SSI as needed 10. Pancytopenia: serial CBC's. Close monitoring of clinical status, no active infection signs at present. Counts appear relatively stable today 11. GI: diarrhea, N/V, multiple stools for C diff neg   -maintain fluids and electrolytes- BUN creeping up, supplement noctural IVF, monitor for increased edema Alb is low  -antidiarrheals, probiotic, has been evaluated by GI during this hospitalization already for diarrhea  -antiemetics, compazine 12. Mood: zoloft  12.  Anorexia add marinol, monitor intake 13.  Osteoporosis steroid induced,  L3 fracture new since Sept, pt comfortable when sedentary, LBP with movement.  No need for  vertebroplasty.  Offered LSO but pt doesn't feel he needs it.  Hydrocodone is helpful   LOS (Days) 6 A FACE TO FACE EVALUATION WAS PERFORMED  Erick Colace 12/15/2012 7:54 AM

## 2012-12-16 ENCOUNTER — Inpatient Hospital Stay (HOSPITAL_COMMUNITY): Payer: BC Managed Care – PPO | Admitting: Physical Therapy

## 2012-12-16 ENCOUNTER — Inpatient Hospital Stay (HOSPITAL_COMMUNITY): Payer: Self-pay | Admitting: Occupational Therapy

## 2012-12-16 ENCOUNTER — Inpatient Hospital Stay (HOSPITAL_COMMUNITY): Payer: BC Managed Care – PPO | Admitting: *Deleted

## 2012-12-16 ENCOUNTER — Inpatient Hospital Stay (HOSPITAL_COMMUNITY): Payer: BC Managed Care – PPO | Admitting: Occupational Therapy

## 2012-12-16 LAB — TACROLIMUS LEVEL: Tacrolimus (FK506) - LabCorp: 6.1 ng/mL

## 2012-12-16 MED ORDER — SODIUM CHLORIDE 0.9 % IJ SOLN
10.0000 mL | Freq: Two times a day (BID) | INTRAMUSCULAR | Status: DC
  Administered 2012-12-25 – 2012-12-27 (×3): 10 mL

## 2012-12-16 MED ORDER — SACCHAROMYCES BOULARDII 250 MG PO CAPS
500.0000 mg | ORAL_CAPSULE | Freq: Two times a day (BID) | ORAL | Status: DC
  Administered 2012-12-16 – 2012-12-29 (×26): 500 mg via ORAL
  Filled 2012-12-16 (×29): qty 2

## 2012-12-16 MED ORDER — SODIUM CHLORIDE 0.9 % IJ SOLN
10.0000 mL | INTRAMUSCULAR | Status: DC | PRN
  Administered 2012-12-16 – 2012-12-22 (×12): 10 mL
  Administered 2012-12-23: 20 mL
  Administered 2012-12-24 – 2012-12-28 (×6): 10 mL
  Administered 2012-12-29: 20 mL

## 2012-12-16 NOTE — Progress Notes (Signed)
Patient ID: Timothy [name], male   DOB: 1946-02-21, 66 y.o.   MRN: [id number] Subjective/Complaints:   Diarrhea persistent Poor appetite still Review of Systems  Constitutional: Positive for weight loss and malaise/fatigue.  Gastrointestinal: Positive for nausea and diarrhea. Negative for abdominal pain.  Neurological: Positive for weakness.  All other systems reviewed and are negative.    Objective: Vital Signs: Blood pressure 109/74, pulse 76, temperature 98.5 F (36.9 C), temperature source Oral, resp. rate 19, height 5\' 7"  (1.702 m), weight 83.8 kg (184 lb 11.9 oz), SpO2 95.00%. Dg Lumbar Spine 2-3 Views  12/14/2012  *RADIOLOGY REPORT*  Clinical Data:   66 year old male low back pain.  No known injury.  LUMBAR SPINE - 2-3 VIEW  Comparison: CT abdomen and pelvis 09/02/2012.  Findings: Normal lumbar segmentation.  Moderate T12 compression fracture is stable to slightly increased since September.  There is a new mild to moderate L3 compression deformity since that time, primarily involving the superior endplate.  Other lumbar levels appear stable and intact.  Grossly intact sacrum.  There are other thoracic compression fractures, not well evaluated on this study.  IMPRESSION: 1.  Mild to moderate L3 compression fracture is new since September, favor acute/subacute in this setting. If specific therapy such as vertebroplasty is desired, MRI or whole body nuclear medicine bone scan would best confirm acuity. 2.  T12 compression fracture stable to slightly increased since September.  There are other lower thoracic compression fractures not well evaluated on these images.   Original Report Authenticated By: Erskine Speed, M.D.     Basename 12/14/12 0422  WBC 5.1  HGB 10.3*  HCT 29.1*  PLT 69*    Basename 12/14/12 0422  NA 135  K 4.3  CL 108  GLUCOSE 91  BUN 27*  CREATININE 1.20  CALCIUM 7.4*   CBG (last 3)  No results found for this basename: GLUCAP:3 in the last 72 hours  Wt Readings  from Last 3 Encounters:  12/16/12 83.8 kg (184 lb 11.9 oz)  12/05/12 84 kg (185 lb 3 oz)  12/03/12 86.3 kg (190 lb 4.1 oz)    Physical Exam:  Constitutional: He is oriented to person, place, and time. He appears well-developed. pale HENT:  Head: Normocephalic and atraumatic.  Right Ear: External ear normal.  Left Ear: External ear normal.  Nose: Nose normal.  Mouth/Throat: Oropharynx is clear and moist.  Eyes: Conjunctivae normal and EOM are normal. Pupils are equal, round, and reactive to light.  Neck: Normal range of motion. Neck supple.  Cardiovascular: Normal rate, regular rhythm, normal heart sounds and intact distal pulses.  Respiratory: Effort normal and breath sounds normal. No respiratory distress. He has no wheezes. He has no rales. He exhibits no tenderness.  GI: Soft. Bowel sounds are scarce. He exhibits no distension. There is tenderness. There is no rebound and no guarding.  Genitourinary: Penis normal.  Musculoskeletal: Normal range of motion.  2+ edema bilateral pretibial 3+ bilateral feet  Neurological: He is alert and oriented to person, place, and time. He has normal reflexes. He displays no tremor. He exhibits normal muscle tone. Gait abnormal.  Motor strength is 4/5 in bilateral deltoid, biceps, triceps, grip 4 minus/5 in bilateral hip flexors knee extensors 4/5 bilateral ankle dorsiflexor plantar flexor some pain in addition with proximal leg movements Skin: Skin is warm and dry.  Psychiatric: He has a normal mood and affect. His behavior is normal. Judgment and thought content normal.    Assessment/Plan: 1. Functional deficits  secondary to deconditioning related CLL recent bone marrow transplant and multiple medical conditions which require 3+ hours per day of interdisciplinary therapy in a comprehensive inpatient rehab setting. Physiatrist is providing close team supervision and 24 hour management of active medical problems listed below. Physiatrist and rehab  team continue to assess barriers to discharge/monitor patient progress toward functional and medical goals. FIM: FIM - Bathing Bathing Steps Patient Completed: Chest;Right Arm;Left Arm;Abdomen;Front perineal area;Buttocks;Right upper leg;Left upper leg Bathing: 0: Activity did not occur  FIM - Upper Body Dressing/Undressing Upper body dressing/undressing steps patient completed: Thread/unthread right sleeve of pullover shirt/dresss;Put head through opening of pull over shirt/dress;Thread/unthread left sleeve of pullover shirt/dress;Pull shirt over trunk Upper body dressing/undressing: 5: Set-up assist to: Obtain clothing/put away FIM - Lower Body Dressing/Undressing Lower body dressing/undressing steps patient completed: Thread/unthread right underwear leg;Thread/unthread left underwear leg;Pull underwear up/down;Pull pants up/down;Thread/unthread left pants leg;Thread/unthread right pants leg;Don/Doff left sock;Don/Doff right sock Lower body dressing/undressing: 4: Steadying Assist  FIM - Toileting Toileting steps completed by patient: Adjust clothing prior to toileting;Performs perineal hygiene;Adjust clothing after toileting Toileting: 5: Supervision: Safety issues/verbal cues  FIM - Diplomatic Services operational officer Devices: Bedside commode Toilet Transfers: 6-More than reasonable amt of time  FIM - Banker Devices: Bed rails;HOB elevated;Arm rests;Walker Bed/Chair Transfer: 3: Sit > Supine: Mod A (lifting assist/Pt. 50-74%/lift 2 legs);3: Supine > Sit: Mod A (lifting assist/Pt. 50-74%/lift 2 legs;4: Chair or W/C > Bed: Min A (steadying Pt. > 75%);4: Bed > Chair or W/C: Min A (steadying Pt. > 75%)  FIM - Locomotion: Wheelchair Distance: 150 Locomotion: Wheelchair: 5: Travels 150 ft or more: maneuvers on rugs and over door sills with supervision, cueing or coaxing FIM - Locomotion: Ambulation Locomotion: Ambulation Assistive Devices:  Designer, industrial/product Ambulation/Gait Assistance: 5: Supervision Locomotion: Ambulation: 5: Travels 150 ft or more with supervision/safety issues  Comprehension Comprehension Mode: Auditory Comprehension: 7-Follows complex conversation/direction: With no assist  Expression Expression Mode: Verbal Expression: 7-Expresses complex ideas: With no assist  Social Interaction Social Interaction: 7-Interacts appropriately with others - No medications needed.  Problem Solving Problem Solving: 7-Solves complex problems: Recognizes & self-corrects  Memory Memory: 7-Complete Independence: No helper Medical Problem List and Plan:  1. DVT Prophylaxis/Anticoagulation: Pharmaceutical: Lovenox  2. Pain Management: Acetaminophen  3. Mood: Zoloft ego support 4. CLL/bone marrow transplant/: mgt per oncology 5. GVHD:  Per heme onc, immunosuppresants/antifungals 6. HTN 7. CKD: regular BMET's, monitor I's and O's, weights. 8. Afib: rate controlled at present. Follow for activity tolerance 9. Steroid induced DM: follow CBG's, SSI as needed 10. Pancytopenia: serial CBC's. Close monitoring of clinical status, no active infection signs at present. Counts appear relatively stable today 11. GI: diarrhea, N/V, multiple stools for C diff neg   -maintain fluids and electrolytes- BUN creeping up, supplement noctural IVF, monitor for increased edema Alb is low  -antidiarrheals, probiotic, has been evaluated by GI during this hospitalization already for diarrhea  -antiemetics, compazine 12. Mood: zoloft   -recheck lytes tomorrow 12.  Anorexia added marinol, monitor intake 13.  Osteoporosis steroid induced,  L3 fracture new since Sept, pt comfortable when sedentary, LBP with movement.  No need for vertebroplasty.  Offered LSO but pt doesn't feel he needs it.  Hydrocodone is helpful   LOS (Days) 7 A FACE TO FACE EVALUATION WAS PERFORMED  SWARTZ,ZACHARY T 12/16/2012 8:19 AM

## 2012-12-16 NOTE — Progress Notes (Signed)
Physical Therapy Note  Patient Details  Name: Timothy [name] MRN: [id number] Date of Birth: October 11, 1946 Today's Date: 12/16/2012  4098-1191 (40 minutes) individual Pain: pt reports momentary 8.10 pain low back with transitional movements/ premedicated Focus of treatment: therapeutic exercises focused on bilateral LE strengthening/ endurance; gait training/endurance Treatment: gait to/from room >< gym RW 120 feet SBA; Nustep Level 3 X 10 minutes LEs only (perceived exertion =  11 light - increase intensity next attempt       ); up/down 6 inch step alternating LEs  With RW support 2 X 10 RT LE , 4 X 5 on left.   Shadman Tozzi,JIM 12/16/2012, 7:28 AM

## 2012-12-16 NOTE — Progress Notes (Signed)
Occupational Therapy Session Note  Patient Details  Name: Timothy [name] MRN: [id number] Date of Birth: 05/14/1946  Today's Date: 12/16/2012 Time: 4098-1191 Time Calculation (min): 40 min  Short Term Goals: Week 1:  OT Short Term Goal 1 (Week 1): Pt will perform toilet transfers on standard height toilet with supervision OT Short Term Goal 2 (Week 1): Pt will perform toileting with supervision OT Short Term Goal 3 (Week 1): Pt will perform LB dressing with supervision OT Short Term Goal 4 (Week 1): Pt will perform walk-in shower transfer with supervisoin  Skilled Therapeutic Interventions/Progress Updates:  Patient found supine in bed with NT present and assisting with peri care secondary to incontinent BM. Therapist helped NT with peri care then applied ace wraps to bilateral LEs for edema management. Patient then engaged in bed mobility to sit edge of bed with min assist for supine -> sit using bed rails. Patient then ambulated ~5 feet with rolling walker -> w/c and sat at sink for grooming tasks. Focused skilled intervention on bed mobility, overall activity tolerance/endurance, functional mobility around room, ADL retraining, and sit/stands. Patient with complaints of being fatigued during session. Before patient was able to complete bathing & dressing tasks he requested to use restroom, assisted patient to bathroom at end of session and patient with diarrhea, left patient seated on BSC in bathroom and patient stated he would press call bell when finished.   Precautions:  Precautions Precautions: Fall Precaution Comments: AFIB, chronic leukemia, immuno compromised, graft vs. host disease; LE edema, ace wrap and elevate LE Restrictions Weight Bearing Restrictions: No  See FIM for current functional status  Therapy/Group: Individual Therapy  Tiphany Fayson 12/16/2012, 9:03 AM

## 2012-12-16 NOTE — Progress Notes (Signed)
Occupational Therapy Session Note  Patient Details  Name: Timothy [name] MRN: [id number] Date of Birth: [birth year]  Today's Date: 12/16/2012 Time: 1430-1500 Time Calculation (min): 30 min  Short Term Goals: Week 1:  OT Short Term Goal 1 (Week 1): Pt will perform toilet transfers on standard height toilet with supervision OT Short Term Goal 2 (Week 1): Pt will perform toileting with supervision OT Short Term Goal 3 (Week 1): Pt will perform LB dressing with supervision OT Short Term Goal 4 (Week 1): Pt will perform walk-in shower transfer with supervisoin Week 2:     Skilled Therapeutic Interventions/Progress Updates:    1:1 focus on bed mobility without bed rail with tactile cues for hips, short distance functional ambulation with RW to bathroom, performed toileting, standing with mod A (wash bottom afterwards don clean brief), ambulate to recliner in room with steady A with cues for RW safety. Perform UB exercises with orange theraband to address activity tolerance, muscle strength and endurance to improve his ADL performance and independence   Therapy Documentation Precautions:  Precautions Precautions: Fall Precaution Comments: AFIB, chronic leukemia, immuno compromised, graft vs. host disease; LE edema, ace wrap and elevate LE  Restrictions Weight Bearing Restrictions: No Pain:  minor bask discomfort- provided repositioning from bed to chair  See FIM for current functional status  Therapy/Group: Individual Therapy  Roney Mans [facility] 12/16/2012, 3:53 PM

## 2012-12-16 NOTE — Progress Notes (Signed)
Physical Therapy Session Note  Patient Details  Name: Chisum Habenicht MRN: [id number] Date of Birth: 03/20/1946  Today's Date: 12/16/2012 Time: 1030-1110 Time Calculation (min): 40 min  Short Term Goals: Week 1:  PT Short Term Goal 1 (Week 1): Patient will consistently perform bed mobility and bed <> w/c transfers with min A, RW with no episodes of LE giving out PT Short Term Goal 2 (Week 1): Patient will perform w/c mobility in controlled environment for endurance x 150' with supervision and one rest break PT Short Term Goal 3 (Week 1): Patient will perform gait in controlled environment x 50' with LRAD and min A PT Short Term Goal 4 (Week 1): Patient will perform up and down 3-5 stairs with two rails and mod A  Skilled Therapeutic Interventions/Progress Updates:    Patient received from PTA supine in bed at end of their session. Patient reports extreme fatigue secondary to several bouts of diarrhea this morning. Patient reports exhaustion, but is agreeable to LE ther ex supine in bed. Patient performed ankle pumps 2x25, heel slides (2x10), hip abd (2x10), quad sets (2x10 with 3" hold), bridges (2x10), hip adduction with pillow squeeze (2x10 with 3" hold).  Patient very fatigued towards end of session with difficulty maintaining eyes open. Patient left with all needs within reach and bed alarm on.  Therapy Documentation Precautions:  Precautions Precautions: Fall Precaution Comments: AFIB, chronic leukemia, immuno compromised, graft vs. host disease; LE edema, ace wrap and elevate LE  Restrictions Weight Bearing Restrictions: No Pain: Pain Assessment Pain Assessment: No/denies pain Pain Score: 0-No pain  See FIM for current functional status  Therapy/Group: Individual Therapy  Chipper Herb. Ravonda Brecheen, PT, DPT  12/16/2012, 11:10 AM

## 2012-12-16 NOTE — Progress Notes (Signed)
Physical Therapy Session Note  Patient Details  Name: Timothy [name] MRN: [id number] Date of Birth: 05/24/1946  Today's Date: 12/16/2012 Time: 1000-1030 Time Calculation (min): 30 min  Short Term Goals: Week 1:  PT Short Term Goal 1 (Week 1): Patient will consistently perform bed mobility and bed <> w/c transfers with min A, RW with no episodes of LE giving out PT Short Term Goal 2 (Week 1): Patient will perform w/c mobility in controlled environment for endurance x 150' with supervision and one rest break PT Short Term Goal 3 (Week 1): Patient will perform gait in controlled environment x 50' with LRAD and min A PT Short Term Goal 4 (Week 1): Patient will perform up and down 3-5 stairs with two rails and mod A  Skilled Therapeutic Interventions/Progress Updates: Patient reported "not having a good morning" due to bowel incontinence and declined leaving room for therapy.  Patient performed supine/sit transfer with supervision and extended time using bedrails and HOB elevated.  Sit/stand transfer from EOB to RW with min A and assistance to don sweatpants in standing at RW.  Session focused on LE strengthening through functional activity:  Stepping up/down on 6" step leading bilaterally.  Patient performed 5 reps stepping up/down on right LE on 6" step using RW for UE support, extended seated rest break, followed by 5 reps stepping up/down on left LE on 6" step using RW for UE support.  Again after extended seated rest break in [facility], second set of step-ups performed 5x each for each LE with rest break between sets.  At conclusion patient returned to bed with mod A for sit/supine transfer.  Discussed with patient possibility of needing to have a bed on main floor of home when he returns home due to difficulty with steps and the number of steps in the home to get to 2nd floor bedroom.  Patient verbalized understanding and agreement.    Therapy Documentation Precautions:  Precautions Precautions:  Fall Precaution Comments: AFIB, chronic leukemia, immuno compromised, graft vs. host disease; LE edema, ace wrap and elevate LE  Restrictions Weight Bearing Restrictions: No General:   Vital Signs:   Pain: Pain Assessment Pain Assessment: No/denies pain Pain Score: 0-No pain  See FIM for current functional status  Therapy/Group: Individual Therapy  Rexene Agent 12/16/2012, 12:20 PM

## 2012-12-17 ENCOUNTER — Inpatient Hospital Stay (HOSPITAL_COMMUNITY): Payer: BC Managed Care – PPO

## 2012-12-17 ENCOUNTER — Inpatient Hospital Stay (HOSPITAL_COMMUNITY): Payer: BC Managed Care – PPO | Admitting: Physical Therapy

## 2012-12-17 DIAGNOSIS — R5381 Other malaise: Secondary | ICD-10-CM

## 2012-12-17 MED ORDER — PSYLLIUM 95 % PO PACK
1.0000 | PACK | Freq: Every day | ORAL | Status: DC
  Administered 2012-12-18 – 2012-12-29 (×9): 1 via ORAL
  Filled 2012-12-17 (×15): qty 1

## 2012-12-17 NOTE — Progress Notes (Signed)
Occupational Therapy Session Note  Patient Details  Name: Timothy [name] MRN: [id number] Date of Birth: [birth year]  Today's Date: 12/17/2012 Time: 4098-1191 Time Calculation (min): 28 min  Short Term Goals: Week 2:   STG=LTG secondary to ELOS  Skilled Therapeutic Interventions/Progress Updates:  Pt in bed with wife at bedside upon arrival.  Pt agreeable to participating in therapeutic activities in gym.  Pt amb with RW to gym with rest stop in ADL apartment.  Pt engaged in dynamic standing activities including reaching for objects outside BOS and above level of head.  Pt also retrieved items from floor using reacher while standing with RW.  Pt transitioned to completing activity bouncing soccer ball against bounceback while standing.  Pt required rest breaks throughout session but did not exhibit any LOB.    Therapy Documentation Precautions:  Precautions Precautions: Fall Precaution Comments: AFIB, chronic leukemia, immuno compromised, graft vs. host disease; LE edema, ace wrap and elevate LE  Restrictions Weight Bearing Restrictions: No  See FIM for current functional status  Therapy/Group: Individual Therapy  Rich Brave 12/17/2012, 3:06 PM

## 2012-12-17 NOTE — Progress Notes (Signed)
Physical Therapy Session Note  Patient Details  Name: Timothy [name] MRN: [id number] Date of Birth: January 10, 1946  Today's Date: 12/17/2012 Time: 1100-1155 Time Calculation (min): 55 min  Short Term Goals: Week 1:  PT Short Term Goal 1 (Week 1): Patient will consistently perform bed mobility and bed <> w/c transfers with min A, RW with no episodes of LE giving out PT Short Term Goal 2 (Week 1): Patient will perform w/c mobility in controlled environment for endurance x 150' with supervision and one rest break PT Short Term Goal 3 (Week 1): Patient will perform gait in controlled environment x 50' with LRAD and min A PT Short Term Goal 4 (Week 1): Patient will perform up and down 3-5 stairs with two rails and mod A  Skilled Therapeutic Interventions/Progress Updates:  Patient states he is "not having a good morning" due to bowel incontinence and declined leaving room for therapy. Patient performed supine/sit transfer with supervision and extended time using bedrails and HOB elevated. Sit/stand transfer from EOB to RW with min A and assistance to don sweatpants in standing at RW. Session focused on LE strengthening through functional activity: Stepping up/down on 6" step alternating legs 5 reps times 2 sets with extended seated rest break between sets.  After initial exercise patient was amenable to working outside of room.  Ambulation with RW in controlled environment for increased endurance and LE strengthening 120' with min A to manage IV pole x 2 bouts with seated rest break between bouts.  At conclusion of session, patient returned to bed with mod A for sit/supine transfer, scooting up in bed modified I with HOB lowered, using bedrails for assistance.   Pain: Pain Assessment Pain Assessment: No/denies pain  See FIM for current functional status  Therapy/Group: Individual Therapy  Rexene Agent 12/17/2012, 12:21 PM

## 2012-12-17 NOTE — Progress Notes (Signed)
Physical Therapy Weekly Progress Note  Patient Details  Name: Timothy [name] MRN: [id number] Date of Birth: 1946/04/16  Today's Date: 12/17/2012 Time: 7829-5621 Time Calculation (min): 35 min Session focused on activities for improving overall endurance, strengthening, and functional transfers/mobility including Nustep on level 3 x 10 minutes (Borg rating of 13), sit to stands from various surfaces with min A, gait with RW up to 120' with close S/steady A, and simulated car transfer. Pt unable to complete car transfer due to limitations of fake car and increased swelling in LE's but pt feels that his car has more leg room and it would not be an issue. Discussed maybe needing to have wife assist with his LE's and pt in agreement. Pt unable to complete session due to fatigue (missed final 10 minutes), returned to bed with mod A for sit to supine (LE management) and propped bilateral LE's onto pillows to assist with swelling.    Patient has met 4 of 4 short term goals.  Pt continuing to make steady progress with mobility at an overall min A level using RW. Pt continues to be limited by endurance, pain, and bowel issues (often due to causing increased fatigue).  Patient continues to demonstrate the following deficits: decreased balance, decreased strength, decreased endurance, and therefore will continue to benefit from skilled PT intervention to enhance overall performance with activity tolerance and balance.  Patient progressing toward long term goals..  Continue plan of care.  PT Short Term Goals Week 1:  PT Short Term Goal 1 (Week 1): Patient will consistently perform bed mobility and bed <> w/c transfers with min A, RW with no episodes of LE giving out PT Short Term Goal 1 - Progress (Week 1): Met PT Short Term Goal 2 (Week 1): Patient will perform w/c mobility in controlled environment for endurance x 150' with supervision and one rest break PT Short Term Goal 2 - Progress (Week 1): Met PT  Short Term Goal 3 (Week 1): Patient will perform gait in controlled environment x 50' with LRAD and min A PT Short Term Goal 3 - Progress (Week 1): Met PT Short Term Goal 4 (Week 1): Patient will perform up and down 3-5 stairs with two rails and mod A PT Short Term Goal 4 - Progress (Week 1): Met Week 2:  PT Short Term Goal 1 (Week 2): = LTGs  Skilled Therapeutic Interventions/Progress Updates:  Ambulation/gait training;Balance/vestibular training;Disease management/prevention;Discharge planning;Community reintegration;DME/adaptive equipment instruction;Functional mobility training;Psychosocial support;Patient/family education;Pain management;Neuromuscular re-education;Skin care/wound management;Splinting/orthotics;Stair training;Therapeutic Activities;UE/LE Coordination activities;UE/LE Strength taining/ROM;Therapeutic Exercise;Wheelchair propulsion/positioning   Therapy Documentation Precautions:  Precautions Precautions: Fall Precaution Comments: AFIB, chronic leukemia, immuno compromised, graft vs. host disease; LE edema, ace wrap and elevate LE  Restrictions Weight Bearing Restrictions: No General: Amount of Missed PT Time (min): 10 Minutes Missed Time Reason: Patient fatigue Locomotion : Ambulation Ambulation/Gait Assistance: 4: Min guard   See FIM for current functional status  Therapy/Group: Individual Therapy  [name] [facility] 12/17/2012, 3:45 PM

## 2012-12-17 NOTE — Progress Notes (Signed)
Occupational Therapy Weekly Progress Note  Patient Details  Name: Timothy [name] MRN: [id number] Date of Birth: 16-Mar-1946  Today's Date: 12/17/2012  Patient has met 3 of 4 short term goals.  Pt has made slow but steady progress since admission.  Pt continues to require assistance with donning/doffing socks primarily because of of BLE edema.  Pt inconsistently is able to cross legs to doff socks.  Pt performs toilet transfers and simulated shower transfers with supervision.  Pt fatigues quickly and requires multiple rest breaks.  Pt is cooperative but is unable to participate consistently secondary to continued diarrhea and fatigue.  Patient continues to demonstrate the following deficits: decreased overall activity tolerance/endurance, decreased independence with ADLs/IADLs, decreased independence with functional mobility, increased pain, increased swelling/edema. Therefore, patient will continue to benefit from skilled OT intervention to enhance overall performance with BADL, iADL and Reduce care partner burden.  Patient progressing toward long term goals..  Continue plan of care.  OT Short Term Goals Week 1:  OT Short Term Goal 1 (Week 1): Pt will perform toilet transfers on standard height toilet with supervision OT Short Term Goal 1 - Progress (Week 1): Met OT Short Term Goal 2 (Week 1): Pt will perform toileting with supervision OT Short Term Goal 2 - Progress (Week 1): Met OT Short Term Goal 3 (Week 1): Pt will perform LB dressing with supervision OT Short Term Goal 3 - Progress (Week 1): Progressing toward goal OT Short Term Goal 4 (Week 1): Pt will perform walk-in shower transfer with supervisoin OT Short Term Goal 4 - Progress (Week 1): Met  Skilled Therapeutic Interventions/Progress Updates:  Balance/vestibular training;Community reintegration;Discharge planning;DME/adaptive equipment instruction;Functional mobility training;Pain management;Patient/family education;Psychosocial  support;Self Care/advanced ADL retraining;Skin care/wound managment;Splinting/orthotics;Therapeutic Activities;Therapeutic Exercise;UE/LE Strength taining/ROM;UE/LE Coordination activities;Wheelchair propulsion/positioning   Precautions:  Precautions Precautions: Fall Precaution Comments: AFIB, chronic leukemia, immuno compromised, graft vs. host disease; LE edema, ace wrap and elevate LE  Restrictions Weight Bearing Restrictions: No  Pain: Pain Assessment Pain Assessment: No/denies pain  See FIM for current functional status  Therapy/Group: Individual Therapy  Timothy [name] 12/17/2012, 2:42 PM

## 2012-12-17 NOTE — Progress Notes (Signed)
Patient ID: Timothy [name], male   DOB: July 26, 1946, 66 y.o.   MRN: [id number] Subjective/Complaints:   Diarrhea better yesterday Poor appetite still Review of Systems  Constitutional: Positive for weight loss and malaise/fatigue.  Gastrointestinal: Positive for nausea and diarrhea. Negative for abdominal pain.  Neurological: Positive for weakness.  All other systems reviewed and are negative.    Objective: Vital Signs: Blood pressure 116/75, pulse 94, temperature 97.8 F (36.6 C), temperature source Oral, resp. rate 16, height 5\' 7"  (1.702 m), weight 85.1 kg (187 lb 9.8 oz), SpO2 95.00%. No results found. No results found for this basename: WBC:2,HGB:2,HCT:2,PLT:2 in the last 72 hours No results found for this basename: NA:2,K:2,CL:2,CO:2,GLUCOSE:2,BUN:2,CREATININE:2,CALCIUM:2 in the last 72 hours CBG (last 3)  No results found for this basename: GLUCAP:3 in the last 72 hours  Wt Readings from Last 3 Encounters:  12/17/12 85.1 kg (187 lb 9.8 oz)  12/05/12 84 kg (185 lb 3 oz)  12/03/12 86.3 kg (190 lb 4.1 oz)    Physical Exam:  Constitutional: He is oriented to person, place, and time. He appears well-developed. pale HENT:  Head: Normocephalic and atraumatic.  Right Ear: External ear normal.  Left Ear: External ear normal.  Nose: Nose normal.  Mouth/Throat: Oropharynx is clear and moist.  Eyes: Conjunctivae normal and EOM are normal. Pupils are equal, round, and reactive to light.  Neck: Normal range of motion. Neck supple.  Cardiovascular: Normal rate, regular rhythm, normal heart sounds and intact distal pulses.  Respiratory: Effort normal and breath sounds normal. No respiratory distress. He has no wheezes. He has no rales. He exhibits no tenderness.  GI: Soft. Bowel sounds are scarce. He exhibits no distension. There is tenderness. There is no rebound and no guarding.  Genitourinary: Penis normal.  Musculoskeletal: Normal range of motion.  2+ edema bilateral pretibial 3+  bilateral feet (unchanged) Neurological: He is alert and oriented to person, place, and time. He has normal reflexes. He displays no tremor. He exhibits normal muscle tone. Gait abnormal.  Motor strength is 4/5 in bilateral deltoid, biceps, triceps, grip 4 minus/5 in bilateral hip flexors knee extensors 4/5 bilateral ankle dorsiflexor plantar flexor some pain in addition with proximal leg movements Skin: Skin is warm and dry.  Psychiatric: He has a normal mood and affect. His behavior is normal. Judgment and thought content normal.    Assessment/Plan: 1. Functional deficits secondary to deconditioning related CLL recent bone marrow transplant and multiple medical conditions which require 3+ hours per day of interdisciplinary therapy in a comprehensive inpatient rehab setting. Physiatrist is providing close team supervision and 24 hour management of active medical problems listed below. Physiatrist and rehab team continue to assess barriers to discharge/monitor patient progress toward functional and medical goals. FIM: FIM - Bathing Bathing Steps Patient Completed: Chest;Right Arm;Left Arm;Abdomen;Front perineal area;Buttocks;Right upper leg;Left upper leg Bathing: 0: Activity did not occur  FIM - Upper Body Dressing/Undressing Upper body dressing/undressing steps patient completed: Thread/unthread right sleeve of pullover shirt/dresss;Put head through opening of pull over shirt/dress;Thread/unthread left sleeve of pullover shirt/dress;Pull shirt over trunk Upper body dressing/undressing: 0: Activity did not occur FIM - Lower Body Dressing/Undressing Lower body dressing/undressing steps patient completed: Thread/unthread right underwear leg;Thread/unthread left underwear leg;Pull underwear up/down;Pull pants up/down;Thread/unthread left pants leg;Thread/unthread right pants leg;Don/Doff left sock;Don/Doff right sock Lower body dressing/undressing: 0: Activity did not occur  FIM -  Toileting Toileting steps completed by patient: Adjust clothing prior to toileting Toileting: 1: Total-Patient completed zero steps, helper did all 3  FIM -  Diplomatic Services operational officer Devices: Bedside commode;Walker;Grab bars Toilet Transfers: 5-To toilet/BSC: Supervision (verbal cues/safety issues)  FIM - Banker Devices: Bed rails;HOB elevated;Arm rests;Walker Bed/Chair Transfer: 0: Activity did not occur (bed ther ex performed secondary to pt fatigue)  FIM - Locomotion: Wheelchair Distance: 150 Locomotion: Wheelchair: 0: Activity did not occur (bed ther ex performed secondary to pt fatigue) FIM - Locomotion: Ambulation Locomotion: Ambulation Assistive Devices: Designer, industrial/product Ambulation/Gait Assistance: Not tested (comment) Locomotion: Ambulation: 0: Activity did not occur (bed ther ex performed secondary to pt fatigue)  Comprehension Comprehension Mode: Auditory Comprehension: 7-Follows complex conversation/direction: With no assist  Expression Expression Mode: Verbal Expression: 7-Expresses complex ideas: With no assist  Social Interaction Social Interaction: 7-Interacts appropriately with others - No medications needed.  Problem Solving Problem Solving: 7-Solves complex problems: Recognizes & self-corrects  Memory Memory: 7-Complete Independence: No helper Medical Problem List and Plan:  1. DVT Prophylaxis/Anticoagulation: Pharmaceutical: Lovenox  2. Pain Management: Acetaminophen  3. Mood: Zoloft ego support 4. CLL/bone marrow transplant/: mgt per oncology 5. GVHD:  Per heme onc, immunosuppresants/antifungals 6. HTN: normodyne with control at present 7. CKD: regular BMET's, monitor I's and O's, weights. 8. Afib: rate controlled at present. Follow for activity tolerance 9. Steroid induced DM: follow CBG's, SSI as needed 10. Pancytopenia: serial CBC's. Close monitoring of clinical status, no active infection  signs at present. Counts appear relatively stable today 11. GI: diarrhea, N/V, multiple stools for C diff neg   -maintain fluids and electrolytes- BUN creeping up, supplement noctural IVF, monitor for increased edema Alb is low  -antidiarrheals, probiotic, has been evaluated by GI during this hospitalization already for diarrhea  -antiemetics, compazine 12. Mood: zoloft   -recheck lytes today 12.  Anorexia added marinol, monitor intake 13.  Osteoporosis steroid induced,  L3 fracture new since Sept, pt comfortable when sedentary, LBP with movement.  No need for vertebroplasty.  Offered LSO but pt doesn't feel he needs it.  Hydrocodone is helpful   LOS (Days) 8 A FACE TO FACE EVALUATION WAS PERFORMED  Anaston Koehn T 12/17/2012 7:55 AM

## 2012-12-17 NOTE — Progress Notes (Addendum)
NUTRITION FOLLOW UP  Intervention:   Continue to encourage patient to increase intake.  Encouraged intake of 1 can Vital 1.5 with each meal and at HS  Nutrition Dx:   Altered GI function related to multiple causes as evidenced by N/V/D.    Goal:   Tolerate regular diet with intake of meals and supplements to prevent further weight loss  Monitor:   PO intake, diet tolerance, labs, weight trend  Assessment:  Physical deconditioning, chronic lymphoid leukemia s/p allogenic transplant and chemo   Pt with decreased appetite.  Still unable to eat much and is only drinking 2 cans of the Vital 1.5 daily.  Marinol has been started to increase appetite.  Patient eating small amounts lunch today. Wife present and asking questions about his meds and a possible transfer to hospital in [place].  Concerns for worsening Host vs Graft earlier in admit.  Pt states continued diarrhea.  Weight is increased 85.1 kg today (84.3 kg on admit) but concern that this is from pitting edema in lower extremities.  Intake remains inadequate requiring nocturnal IVF.  Height: Ht Readings from Last 1 Encounters:  12/09/12 5\' 7"  (1.702 m)    Weight Status:   Wt Readings from Last 1 Encounters:  12/17/12 187 lb 9.8 oz (85.1 kg)    Re-estimated needs:  Kcal: 2200-2300 Protein: 100-115 gm Fluid: 2.2-2.3 L daily  Skin: warm and dry  Diet Order: General   Intake/Output Summary (Last 24 hours) at 12/17/12 1329 Last data filed at 12/17/12 0800  Gross per 24 hour  Intake    240 ml  Output      0 ml  Net    240 ml    Last BM: daily   Labs:   Lab 12/14/12 0422 12/13/12 1625 12/13/12 0612 12/12/12 0700  NA 135 -- 133* 133*  K 4.3 -- 3.7 3.7  CL 108 -- 107 107  CO2 20 -- 16* 15*  BUN 27* -- 24* 22  CREATININE 1.20 -- 1.11 1.21  CALCIUM 7.4* -- 7.3* 7.2*  MG -- 1.5 -- --  PHOS -- 2.9 -- --  GLUCOSE 91 -- 91 75    CBG (last 3)  No results found for this basename: GLUCAP:3 in the last 72  hours  Scheduled Meds:   . acyclovir  400 mg Oral BID  . antiseptic oral rinse  15 mL Mouth Rinse q12n4p  . budesonide  9 mg Oral Daily  . dronabinol  2.5 mg Oral BID AC  . enoxaparin (LOVENOX) injection  40 mg Subcutaneous Q24H  . feeding supplement (VITAL 1.5 CAL)  237 mL Oral TID WC & HS  . fluconazole  100 mg Oral q morning - 10a  . HYDROcodone-acetaminophen  1 tablet Oral Q breakfast  . labetalol  200 mg Oral BID  . pantoprazole  40 mg Oral Daily  . predniSONE  5 mg Oral Q breakfast  . prochlorperazine  5 mg Oral TID AC  . protein supplement  1 scoop Oral TID WC  . psyllium  1 packet Oral Daily  . saccharomyces boulardii  500 mg Oral BID  . sertraline  100 mg Oral QODAY  . sertraline  50 mg Oral QODAY  . sodium chloride  10-40 mL Intracatheter Q12H  . sulfamethoxazole-trimethoprim  1 tablet Oral 2 times weekly  . tacrolimus  1.5 mg Oral BID  . testosterone  1 patch Transdermal Daily    Continuous Infusions:   . sodium chloride 75 mL/hr at  12/16/12 2307    Oran Rein, RD, LDN Clinical Inpatient Dietitian Pager:  [phone] Weekend and after hours pager:  [phone]

## 2012-12-17 NOTE — Progress Notes (Signed)
Occupational Therapy Session Note  Patient Details  Name: Timothy [name] MRN: [id number] Date of Birth: July 01, 1946  Today's Date: 12/17/2012 Time: 1000-1055 Time Calculation (min): 55 min  Short Term Goals: Week 1:  OT Short Term Goal 1 (Week 1): Pt will perform toilet transfers on standard height toilet with supervision OT Short Term Goal 2 (Week 1): Pt will perform toileting with supervision OT Short Term Goal 3 (Week 1): Pt will perform LB dressing with supervision OT Short Term Goal 4 (Week 1): Pt will perform walk-in shower transfer with supervisoin  Skilled Therapeutic Interventions/Progress Updates:    Pt on BSC upon arrival.  Upon completion pt requested to rest for a "short time" because he wasn't feeling too well this morning.  After rest I wrapped BLE with ACE bandages for edema.  Pt amb with RW to ADL apartment for dynamic standing activities and functional amb with RW to include reaching for objects at various heights.  Pt required multiple rest breaks throughout session.  Pt states that he experiences increased pain in lower back with transitional movements.  Pt also states he does not have any pain when standing, sitting, or laying down.  Focus on activity tolerance, functional ambulation with RW, and dynamic standing balance.  Therapy Documentation Precautions:  Precautions Precautions: Fall Precaution Comments: AFIB, chronic leukemia, immuno compromised, graft vs. host disease; LE edema, ace wrap and elevate LE  Restrictions Weight Bearing Restrictions: No   Pain: Pain Assessment Pain Assessment: 0-10 Pain Score:   8 Pain Type: Acute pain Pain Location: Back Pain Orientation: Lower;Medial Pain Descriptors: Aching Pain Onset: With Activity  See FIM for current functional status  Therapy/Group: Individual Therapy  Rich Brave 12/17/2012, 10:03 AM

## 2012-12-18 ENCOUNTER — Inpatient Hospital Stay (HOSPITAL_COMMUNITY): Payer: BC Managed Care – PPO

## 2012-12-18 ENCOUNTER — Encounter (HOSPITAL_COMMUNITY): Payer: Self-pay

## 2012-12-18 ENCOUNTER — Inpatient Hospital Stay (HOSPITAL_COMMUNITY): Payer: Self-pay | Admitting: Occupational Therapy

## 2012-12-18 LAB — BASIC METABOLIC PANEL
CO2: 18 mEq/L — ABNORMAL LOW (ref 19–32)
Calcium: 7.1 mg/dL — ABNORMAL LOW (ref 8.4–10.5)
Creatinine, Ser: 0.88 mg/dL (ref 0.50–1.35)
Glucose, Bld: 84 mg/dL (ref 70–99)
Sodium: 131 mEq/L — ABNORMAL LOW (ref 135–145)

## 2012-12-18 MED ORDER — LOPERAMIDE HCL 2 MG PO CAPS
4.0000 mg | ORAL_CAPSULE | Freq: Two times a day (BID) | ORAL | Status: DC
  Administered 2012-12-19 – 2012-12-29 (×22): 4 mg via ORAL
  Filled 2012-12-18 (×24): qty 2

## 2012-12-18 MED ORDER — PREDNISONE 20 MG PO TABS
30.0000 mg | ORAL_TABLET | Freq: Every day | ORAL | Status: DC
  Administered 2012-12-19 – 2012-12-26 (×8): 30 mg via ORAL
  Filled 2012-12-18 (×11): qty 1

## 2012-12-18 MED ORDER — CALCIUM CARBONATE ANTACID 500 MG PO CHEW
1.0000 | CHEWABLE_TABLET | Freq: Three times a day (TID) | ORAL | Status: AC
  Administered 2012-12-18 – 2012-12-20 (×6): 200 mg via ORAL
  Filled 2012-12-18 (×6): qty 1

## 2012-12-18 MED ORDER — SODIUM CHLORIDE 0.9 % IV SOLN
60.0000 mg | Freq: Once | INTRAVENOUS | Status: AC
  Administered 2012-12-18: 60 mg via INTRAVENOUS
  Filled 2012-12-18: qty 20

## 2012-12-18 MED ORDER — LOPERAMIDE HCL 2 MG PO CAPS
2.0000 mg | ORAL_CAPSULE | Freq: Four times a day (QID) | ORAL | Status: DC
  Administered 2012-12-18 (×3): 2 mg via ORAL
  Filled 2012-12-18 (×5): qty 1

## 2012-12-18 NOTE — Progress Notes (Signed)
Physical Therapy Session Note  Patient Details  Name: Timothy [name] MRN: [id number] Date of Birth: 1946-12-07  Today's Date: 12/18/2012 Time: 1300-1330 (30 min)  Short Term Goals: Week 2:  PT Short Term Goal 1 (Week 2): = LTGs  Skilled Therapeutic Interventions/Progress Updates:    Pt still with some nausea but reports he is feeling a little better and would like to try therapy. Supine to sit with S with HOB elevated and use of rails, therapist applied ACE wraps to bilateral LE's for edema control. Gait for endurance and strengthening with S to gym. Nustep for generalized endurance and strengthening on level 4 x 10 minutes. S transfer back to bed end of session with mod A to return to supine. LE's elevated on pillows end of session for edema control  Therapy Documentation Precautions:  Precautions Precautions: Fall Precaution Comments: AFIB, chronic leukemia, immuno compromised, graft vs. host disease; LE edema, ace wrap and elevate LE  Restrictions Weight Bearing Restrictions: No   Pain:  Chronic back pain - premedicated.   See FIM for current functional status  Therapy/Group: Individual Therapy  [name] [facility] 12/18/2012, 2:01 PM

## 2012-12-18 NOTE — Progress Notes (Signed)
Patient ID: Timothy [name], male   DOB: 1946-02-16, 66 y.o.   MRN: [id number] Subjective/Complaints:   Diarrhea increased again yesterday into today.  Poor intake yesterday Review of Systems  Constitutional: Positive for weight loss and malaise/fatigue.  Gastrointestinal: Positive for nausea and diarrhea. Negative for abdominal pain.  Neurological: Positive for weakness.  All other systems reviewed and are negative.    Objective: Vital Signs: Blood pressure 143/87, pulse 78, temperature 98.5 F (36.9 C), temperature source Oral, resp. rate 18, height 5\' 7"  (1.702 m), weight 82.2 kg (181 lb 3.5 oz), SpO2 96.00%. No results found. No results found for this basename: WBC:2,HGB:2,HCT:2,PLT:2 in the last 72 hours  Basename 12/18/12 0448  NA 131*  K 3.8  CL 106  GLUCOSE 84  BUN 21  CREATININE 0.88  CALCIUM 7.1*   CBG (last 3)  No results found for this basename: GLUCAP:3 in the last 72 hours  Wt Readings from Last 3 Encounters:  12/18/12 82.2 kg (181 lb 3.5 oz)  12/05/12 84 kg (185 lb 3 oz)  12/03/12 86.3 kg (190 lb 4.1 oz)    Physical Exam:  Constitutional: He is oriented to person, place, and time. He appears well-developed. pale HENT:  Head: Normocephalic and atraumatic.  Right Ear: External ear normal.  Left Ear: External ear normal.  Nose: Nose normal.  Mouth/Throat: Oropharynx is clear and moist.  Eyes: Conjunctivae normal and EOM are normal. Pupils are equal, round, and reactive to light.  Neck: Normal range of motion. Neck supple.  Cardiovascular: Normal rate, regular rhythm, normal heart sounds and intact distal pulses.  Respiratory: Effort normal and breath sounds normal. No respiratory distress. He has no wheezes. He has no rales. He exhibits no tenderness.  GI: Soft. Bowel sounds are scarce. He exhibits no distension. There is tenderness. There is no rebound and no guarding.  Genitourinary: Penis normal.  Musculoskeletal: Normal range of motion.  1+ edema  bilateral pretibial 2+ bilateral feet   Neurological: He is alert and oriented to person, place, and time. He has normal reflexes. He displays no tremor. He exhibits normal muscle tone. Gait abnormal.  Motor strength is 4/5 in bilateral deltoid, biceps, triceps, grip 4 minus/5 in bilateral hip flexors knee extensors 4/5 bilateral ankle dorsiflexor plantar flexor some pain in addition with proximal leg movements Skin: Skin is warm and dry.  Psychiatric: He has a normal mood and affect. His behavior is normal. Judgment and thought content normal.    Assessment/Plan: 1. Functional deficits secondary to deconditioning related CLL recent bone marrow transplant and multiple medical conditions which require 3+ hours per day of interdisciplinary therapy in a comprehensive inpatient rehab setting. Physiatrist is providing close team supervision and 24 hour management of active medical problems listed below. Physiatrist and rehab team continue to assess barriers to discharge/monitor patient progress toward functional and medical goals. FIM: FIM - Bathing Bathing Steps Patient Completed: Chest;Right Arm;Left Arm;Abdomen;Front perineal area;Buttocks;Right upper leg;Left upper leg Bathing: 0: Activity did not occur  FIM - Upper Body Dressing/Undressing Upper body dressing/undressing steps patient completed: Thread/unthread right sleeve of pullover shirt/dresss;Put head through opening of pull over shirt/dress;Thread/unthread left sleeve of pullover shirt/dress;Pull shirt over trunk Upper body dressing/undressing: 0: Activity did not occur FIM - Lower Body Dressing/Undressing Lower body dressing/undressing steps patient completed: Thread/unthread right underwear leg;Thread/unthread left underwear leg;Pull underwear up/down;Pull pants up/down;Thread/unthread left pants leg;Thread/unthread right pants leg;Don/Doff left sock;Don/Doff right sock Lower body dressing/undressing: 0: Activity did not occur  FIM -  Toileting Toileting steps  completed by patient: Adjust clothing prior to toileting;Performs perineal hygiene;Adjust clothing after toileting Toileting: 5: Supervision: Safety issues/verbal cues  FIM - Diplomatic Services operational officer Devices: Grab bars;Walker Toilet Transfers: 5-To toilet/BSC: Supervision (verbal cues/safety issues)  FIM - Banker Devices: HOB elevated;Bed rails;Walker Bed/Chair Transfer: 3: Sit > Supine: Mod A (lifting assist/Pt. 50-74%/lift 2 legs);4: Chair or W/C > Bed: Min A (steadying Pt. > 75%)  FIM - Locomotion: Wheelchair Distance: 150 Locomotion: Wheelchair: 0: Activity did not occur FIM - Locomotion: Ambulation Locomotion: Ambulation Assistive Devices: Designer, industrial/product Ambulation/Gait Assistance: 4: Min guard Locomotion: Ambulation: 2: Travels 50 - 149 ft with minimal assistance (Pt.>75%)  Comprehension Comprehension Mode: Auditory Comprehension: 7-Follows complex conversation/direction: With no assist  Expression Expression Mode: Verbal Expression: 7-Expresses complex ideas: With no assist  Social Interaction Social Interaction: 7-Interacts appropriately with others - No medications needed.  Problem Solving Problem Solving: 7-Solves complex problems: Recognizes & self-corrects  Memory Memory: 7-Complete Independence: No helper Medical Problem List and Plan:  1. DVT Prophylaxis/Anticoagulation: Pharmaceutical: Lovenox  2. Pain Management: Acetaminophen  3. Mood: Zoloft ego support 4. CLL/bone marrow transplant/: mgt per oncology 5. GVHD:  Per heme onc, immunosuppresants/antifungals 6. HTN: normodyne with control at present 7. CKD: regular BMET's, monitor I's and O's, weights. 8. Afib: rate controlled at present. Follow for activity tolerance 9. Steroid induced DM: follow CBG's, SSI as needed 10. Pancytopenia: serial CBC's. Close monitoring of clinical status, no active infection signs at  present. Counts appear relatively stable today 11. GI: diarrhea, N/V, multiple stools for C diff neg   -maintain fluids and electrolytes-    -will schedule immodium, probiotic, has been evaluated by GI during this hospitalization already for diarrhea  -antiemetics, compazine 12. Mood: zoloft   -recheck of BMET showed improvement but with increased diarrhea yesterday i'm hesitant to stop ivf. 12.  Anorexia added marinol, monitor intake 13.  Osteoporosis steroid induced,  L3 fracture new since Sept, pt comfortable when sedentary, LBP with movement.  No need for vertebroplasty.  Offered LSO but pt doesn'[name] feel he needs it.  Hydrocodone is helpful   LOS (Days) 9 A FACE TO FACE EVALUATION WAS PERFORMED  Timothy [name] 12/18/2012 8:15 AM

## 2012-12-18 NOTE — Progress Notes (Signed)
Occupational Therapy Note  Patient Details  Name: Damont Balles MRN: [id number] Date of Birth: December 28, 1945 Today's Date: 12/18/2012  Pt missed 60 mins skilled OT services.  Pt in bed asleep and did not fully arouse when awakened.  Pt extremely fatigued and c/o n/v.  Rn aware.   Lavone Neri [facility] 12/18/2012, 11:12 AM

## 2012-12-18 NOTE — Progress Notes (Signed)
Timothy [name]   DOB:[birth year]   ZO#:[id number]   JWJ#:[id number]  Subjective: c/o more frequent diarrhea (4 times so far today counting from midnight); still slightly formed; feels he is making progress in rehab; has been hecking w insurance and [place] and is thinking of flying to the [facility] after discharge from rehab here; pain as before; nausea well controlled   Objective: middle aged white male examined in bed Filed Vitals:   12/18/12 1422  BP: 94/63  Pulse: 84  Temp: 98.4 F (36.9 C)  Resp: 19    Body mass index is 28.38 kg/(m^2).  Intake/Output Summary (Last 24 hours) at 12/18/12 1640 Last data filed at 12/18/12 1241  Gross per 24 hour  Intake     20 ml  Output      0 ml  Net     20 ml     Sclerae unicteric; lids not irritated  Oropharynx no thrush, no sores, no sign of GVHD  No peripheral adenopathy  Lungs clear -- no rales or rhonchi  Heart regular rate and rhythm  Abdomen soft, NT, +BS  MSK no focal spinal tenderness, 1+ bilateral LE edema  Neuro nonfocal  Skin: no rash  CBG (last 3)  No results found for this basename: GLUCAP:3 in the last 72 hours   Labs:  Lab Results  Component Value Date   WBC 5.1 12/14/2012   HGB 10.3* 12/14/2012   HCT 29.1* 12/14/2012   MCV 83.6 12/14/2012   PLT 69* 12/14/2012   NEUTROABS 1.3* 12/14/2012    @LASTCHEMISTRY @  Urine Studies No results found for this basename: UACOL:2,UAPR:2,USPG:2,UPH:2,UTP:2,UGL:2,UKET:2,UBIL:2,UHGB:2,UNIT:2,UROB:2,ULEU:2,UEPI:2,UWBC:2,URBC:2,UBAC:2,CAST:2,CRYS:2,UCOM:2,BILUA:2 in the last 72 hours  Basic Metabolic Panel:  Lab 12/18/12 6213 12/14/12 0422 12/13/12 1625 12/13/12 0612 12/12/12 0700  NA 131* 135 -- 133* 133*  K 3.8 4.3 -- -- --  CL 106 108 -- 107 107  CO2 18* 20 -- 16* 15*  GLUCOSE 84 91 -- 91 75  BUN 21 27* -- 24* 22  CREATININE 0.88 1.20 -- 1.11 1.21  CALCIUM 7.1* 7.4* -- 7.3* 7.2*  MG -- -- 1.5 -- --  PHOS -- -- 2.9 -- --   GFR Estimated Creatinine  Clearance: 84.7 ml/min (by C-G formula based on Cr of 0.88). Liver Function Tests:  Lab 12/14/12 0422 12/13/12 0612 12/12/12 0700  AST 23 20 18   ALT 17 16 15   ALKPHOS 86 77 65  BILITOT 0.2* 0.2* 0.2*  PROT 3.4* 3.2* 3.2*  ALBUMIN 1.4* 1.4* 1.3*   No results found for this basename: LIPASE:5,AMYLASE:5 in the last 168 hours No results found for this basename: AMMONIA:5 in the last 168 hours Coagulation profile No results found for this basename: INR:5,PROTIME:5 in the last 168 hours  CBC:  Lab 12/14/12 0422 12/13/12 0612 12/12/12 0700  WBC 5.1 4.9 4.5  NEUTROABS 1.3* 1.7 1.0*  HGB 10.3* 11.2* 10.3*  HCT 29.1* 31.6* 28.8*  MCV 83.6 84.0 84.0  PLT 69* 72* 68*   Cardiac Enzymes: No results found for this basename: CKTOTAL:5,CKMB:5,CKMBINDEX:5,TROPONINI:5 in the last 168 hours BNP: No components found with this basename: POCBNP:5 CBG: No results found for this basename: GLUCAP:5 in the last 168 hours D-Dimer No results found for this basename: DDIMER:2 in the last 72 hours Hgb A1c No results found for this basename: HGBA1C:2 in the last 72 hours Lipid Profile No results found for this basename: CHOL:2,HDL:2,LDLCALC:2,TRIG:2,CHOLHDL:2,LDLDIRECT:2 in the last 72 hours Thyroid function studies No results found for this basename: TSH,T4TOTAL,FREET3,T3FREE,THYROIDAB in the last 72  hours Anemia work up No results found for this basename: VITAMINB12:2,FOLATE:2,FERRITIN:2,TIBC:2,IRON:2,RETICCTPCT:2 in the last 72 hours Microbiology No results found for this or any previous visit (from the past 240 hour(s)).    Studies:  No results found.  Assessment: 66 y.o. [place] man with a history of well-differentiated lymphocytic lymphoma/ chronic lymphoid leukemia initially diagnosed in 2000, not requiring intervention until 2006; with multiple chromosomal abnormalities; s/p allogeneic transplant March 2013 at "the [facility]", now admitted with GVHD. His treatment history is as follows:    (1) fludarabine/cyclophosphamide/rituximab x5 completed May 2007.  (2) rituximab for 8 doses October 2010, with partial response  (3) Leustatin and ofatumumab weekly x8 July to September 2011 followed by maintenance ofatumumab maintenance ofatumumab every 2 months, with initial response but rising counts September 2012  (4) status-post unrelated donor stem-cell transplant 02/24/2012 at the [facility] in [place]  (a) conditioning regimen consisted of fludarabine + TBI at 200 cGy, followed by rituximab x27;  (b) CMV reactivation x3 (patient CMV positive, donor negative), s/p ganciclovir treatment; 3d reactivation August 2013, s/p gancyclovir, with negative PCR mid-September 2013; last gancyclovir dose 10/06/2012  (c) GVHD: involving gut and skin, treated with steroids, tacrolimus and MMF initially, MMF eventually d/c'd and tacrolimus tapered  (d) atrial fibrillation: resolved on brief amiodarone regimen  (e) steroid-induced myopathy  (f) hypomagnesemia: resolved with discontinuation of gancyclovir  (g) hypogammaglobulinemia: s/p IVIG most recently 12/03/2012  (h) elevated triglycerides (606 on 07/14/2012)  (i) adrenal insufficiency: on prednisone and budesonide  (j) pancytopenia; off growth factors currently  (5) restaging studies September 2013 including CT scans, flow cytometry, and bone marrow biopsy, showed no evidence of residual chronic lymphoid leukemia.  (6) recurrent GVHD (skin rash, mouth changes, severe diarrhea and gastric/duodenal/colonic biopsies 11/17/2012 c/w GVHD grade 2) now chief sign is diarrhea; will repeat c diff just in case but this is going to be persistent low-grade GVHD; discussion with [place] suggested increasing AM prednisone dose (7) severe malnutrition -- on supplements  (8) testosterone deficiency--being supplemented  (9) deconditioning: REHAB  (10) mild dehydration: encouraged increased po fluids; may need IVF support intermittently  (11) compression fractures:  progressive, doubtless rleated to steroids; will give single dose of pamidronate (12) nausea: improved onlow-dose compazine TIDac, marinol  Plan: benefitting from Rehab; will write for pamidronate as discussed above and increase AM prednisone dose; will write for imodium w breakfast and lunch, then lomotil PRN. Overall plan is to receive Rehab here as planned then return to [facility] for reassessment  If there are additional issues please consult my partners, or, you may contact the [facility] in [place] directly at [phone]     [facility] 12/18/2012

## 2012-12-18 NOTE — Progress Notes (Signed)
Occupational Therapy Session Note  Patient Details  Name: Timothy [name] MRN: [id number] Date of Birth: 27-May-1946  Today's Date: 12/18/2012 Time: 7829-5621 Time Calculation (min): 20 min  Skilled Therapeutic Interventions/Progress Updates:    1:1 pt in bed upon arrival. Focus on bed mobility to come to EOB, functional ambulation with RW to bathroom, standing balance with occasional steadying A 2ndary to losing balance posteriorly with clothing management and hygiene. Pt agreed to work on activity tolerance, balance and functional mobility but after ambulation down to RN station pt reported he was just too fatigued/tired to continue. Returned to bed. Wife present for session.  Therapy Documentation Precautions:  Precautions Precautions: Fall Precaution Comments: AFIB, chronic leukemia, immuno compromised, graft vs. host disease; LE edema, ace wrap and elevate LE  Restrictions Weight Bearing Restrictions: No General: General Amount of Missed OT Time (min): 25 Minutes    Pain:  c/o back pain - unrated but requested meds - RN made aware  See FIM for current functional status  Therapy/Group: Individual Therapy  Roney Mans [facility] 12/18/2012, 2:16 PM

## 2012-12-18 NOTE — Progress Notes (Signed)
Physical Therapy Session Note  Patient Details  Name: Timothy [name] MRN: [id number] Date of Birth: [birth year]  Today's Date: 12/18/2012 Time: 4098-1191 Time Calculation (min): 21 min  Short Term Goals: Week 2:  PT Short Term Goal 1 (Week 2): = LTGs  Skilled Therapeutic Interventions/Progress Updates:  Session scheduled for 8am, but pt requested time to finish breakfast; when therapist returned pt coming out of bathroom with RN and receiving medication, deferred session per pt request. Attempted to make up time later, and pt with complaints of nausea (RN notified) and preferred to only work on supine therex at this time. Pt difficult time staying awake throughout exercises but did as much as able until unable to maintain alertness long enough to continue to participate. Able to complete ankle pumps, heel slides, hip abduction x 12-15 reps each. Repositioned and LE's elevated for edema control - RN notified of pt status.   Therapy Documentation Precautions:  Precautions Precautions: Fall Precaution Comments: AFIB, chronic leukemia, immuno compromised, graft vs. host disease; LE edema, ace wrap and elevate LE  Restrictions Weight Bearing Restrictions: No  Pain: Reports chronic pain in low back - no intervention required.  See FIM for current functional status  Therapy/Group: Individual Therapy  [name] [facility] 12/18/2012, 9:20 AM

## 2012-12-19 ENCOUNTER — Inpatient Hospital Stay (HOSPITAL_COMMUNITY): Payer: BC Managed Care – PPO | Admitting: Occupational Therapy

## 2012-12-19 NOTE — Progress Notes (Signed)
4 stools in past 24 hours. C diff spec sent at 2032. PRN lomotil given at 2046 and at 0038. BLE pitting edema. Removed Ace wraps at HS, constricting, may need to re-wrap after lunch . Prevalon boots applied bilaterally. 1/2 NS at 75cc/hr, infusing via right central line. Ambulating to bathroom with walker with steadying assistance. Decreased endurance. Without complaint of pain.Timothy [name]

## 2012-12-19 NOTE — Progress Notes (Signed)
Occupational Therapy Session Note  Patient Details  Name: Durward Matranga MRN: [id number] Date of Birth: 1946-01-16  Today's Date: 12/19/2012 Time:  - 11:08-12:20    Total minutes=72    Skilled Therapeutic Interventions/Progress Updates:   ADL in w/c at sink with focus on endurance as well as follows:  patient required extra time to process and complete all tasks and often exhibited STmemory and/attention concerns as throughout the tasks he either sat with a blank stare or asked what he was supposed to do next.    As well, patient nauseated during session and required moments to rest.     Therapy Documentation Precautions:  Precautions Precautions: Fall Precaution Comments: AFIB, chronic leukemia, immuno compromised, graft vs. host disease; LE edema, ace wrap and elevate LE  Restrictions Weight Bearing Restrictions: No  Pain:no c/o   See FIM for current functional status  Therapy/Group: Individual Therapy  Bud Face [facility] 12/19/2012, 4:36 PM

## 2012-12-19 NOTE — Progress Notes (Signed)
Patient ID: Timothy [name], male   DOB: 10/27/1946, 66 y.o.   MRN: [id number] Subjective/Complaints:   Diarrhea a little less. Ate a bit more. Likes supplemental shakes Poor intake yesterday Review of Systems  Constitutional: Positive for weight loss and malaise/fatigue.  Gastrointestinal: Positive for nausea and diarrhea. Negative for abdominal pain.  Neurological: Positive for weakness.  All other systems reviewed and are negative.    Objective: Vital Signs: Blood pressure 112/71, pulse 83, temperature 98.7 F (37.1 C), temperature source Oral, resp. rate 18, height 5\' 7"  (1.702 m), weight 89.5 kg (197 lb 5 oz), SpO2 94.00%. No results found. No results found for this basename: WBC:2,HGB:2,HCT:2,PLT:2 in the last 72 hours  Basename 12/18/12 0448  NA 131*  K 3.8  CL 106  GLUCOSE 84  BUN 21  CREATININE 0.88  CALCIUM 7.1*   CBG (last 3)  No results found for this basename: GLUCAP:3 in the last 72 hours  Wt Readings from Last 3 Encounters:  12/19/12 89.5 kg (197 lb 5 oz)  12/05/12 84 kg (185 lb 3 oz)  12/03/12 86.3 kg (190 lb 4.1 oz)    Physical Exam:  Constitutional: He is oriented to person, place, and time. He appears well-developed. pale HENT:  Head: Normocephalic and atraumatic.  Right Ear: External ear normal.  Left Ear: External ear normal.  Nose: Nose normal.  Mouth/Throat: Oropharynx is clear and moist.  Eyes: Conjunctivae normal and EOM are normal. Pupils are equal, round, and reactive to light.  Neck: Normal range of motion. Neck supple.  Cardiovascular: Normal rate, regular rhythm, normal heart sounds and intact distal pulses.  Respiratory: Effort normal and breath sounds normal. No respiratory distress. He has no wheezes. He has no rales. He exhibits no tenderness.  GI: Soft. Bowel sounds are scarce. He exhibits no distension. There is tenderness. There is no rebound and no guarding.  Genitourinary: Penis normal.  Musculoskeletal: Normal range of motion.    1+ edema bilateral pretibial 2+ bilateral feet , left more than right Neurological: He is alert and oriented to person, place, and time. He has normal reflexes. He displays no tremor. He exhibits normal muscle tone. Gait abnormal.  Motor strength is 4/5 in bilateral deltoid, biceps, triceps, grip 4 minus/5 in bilateral hip flexors knee extensors 4/5 bilateral ankle dorsiflexor plantar flexor some pain in addition with proximal leg movements Skin: Skin is warm and dry.  Psychiatric: He has a normal mood and affect. His behavior is normal. Judgment and thought content normal.    Assessment/Plan: 1. Functional deficits secondary to deconditioning related CLL recent bone marrow transplant and multiple medical conditions which require 3+ hours per day of interdisciplinary therapy in a comprehensive inpatient rehab setting. Physiatrist is providing close team supervision and 24 hour management of active medical problems listed below. Physiatrist and rehab team continue to assess barriers to discharge/monitor patient progress toward functional and medical goals. FIM: FIM - Bathing Bathing Steps Patient Completed: Chest;Right Arm;Left Arm;Abdomen;Front perineal area;Buttocks;Right upper leg;Left upper leg Bathing: 0: Activity did not occur  FIM - Upper Body Dressing/Undressing Upper body dressing/undressing steps patient completed: Thread/unthread right sleeve of pullover shirt/dresss;Put head through opening of pull over shirt/dress;Thread/unthread left sleeve of pullover shirt/dress;Pull shirt over trunk Upper body dressing/undressing: 0: Activity did not occur FIM - Lower Body Dressing/Undressing Lower body dressing/undressing steps patient completed: Thread/unthread right underwear leg;Thread/unthread left underwear leg;Pull underwear up/down;Pull pants up/down;Thread/unthread left pants leg;Thread/unthread right pants leg;Don/Doff left sock;Don/Doff right sock Lower body dressing/undressing: 0:  Activity did  not occur  FIM - Toileting Toileting steps completed by patient: Adjust clothing prior to toileting;Performs perineal hygiene;Adjust clothing after toileting Toileting: 5: Supervision: Safety issues/verbal cues  FIM - Diplomatic Services operational officer Devices: Bedside commode;Walker Toilet Transfers: 5-To toilet/BSC: Supervision (verbal cues/safety issues)  FIM - Banker Devices: Therapist, occupational: 5: Supine > Sit: Supervision (verbal cues/safety issues);5: Sit > Supine: Supervision (verbal cues/safety issues);5: Bed > Chair or W/C: Supervision (verbal cues/safety issues)  FIM - Locomotion: Wheelchair Distance: 150 Locomotion: Wheelchair: 0: Activity did not occur FIM - Locomotion: Ambulation Locomotion: Ambulation Assistive Devices: Designer, industrial/product Ambulation/Gait Assistance: 5: Supervision Locomotion: Ambulation: 2: Travels 50 - 149 ft with supervision/safety issues  Comprehension Comprehension Mode: Auditory Comprehension: 7-Follows complex conversation/direction: With no assist  Expression Expression Mode: Verbal Expression: 7-Expresses complex ideas: With no assist  Social Interaction Social Interaction: 7-Interacts appropriately with others - No medications needed.  Problem Solving Problem Solving: 7-Solves complex problems: Recognizes & self-corrects  Memory Memory: 7-Complete Independence: No helper Medical Problem List and Plan:  1. DVT Prophylaxis/Anticoagulation: Pharmaceutical: Lovenox  2. Pain Management: Acetaminophen  3. Mood: Zoloft ego support 4. CLL/bone marrow transplant/: mgt per oncology 5. GVHD:  Per heme onc, immunosuppresants/antifungals 6. HTN: normodyne with control at present 7. CKD: regular BMET's, monitor I's and O's, weights. 8. Afib: rate controlled at present. Follow for activity tolerance 9. Steroid induced DM: follow CBG's, SSI as needed 10. Pancytopenia: serial  CBC's. Close monitoring of clinical status, no active infection signs at present. Counts appear relatively stable today 11. GI: diarrhea, N/V, multiple stools for C diff neg   -maintain fluids and electrolytes-  Encouraged supplements  -will schedule immodium, probiotic, has been evaluated by GI during this hospitalization already for diarrhea  -antiemetics, compazine 12. Mood: zoloft   -continue IVF  -recheck tomorrow 12.  Anorexia added marinol, monitor intake, really has't improved much 13.  Osteoporosis steroid induced,  L3 fracture new since Sept, pt comfortable when sedentary, LBP with movement.  No need for vertebroplasty.  Offered LSO but pt doesn't feel he needs it.  Hydrocodone is helpful   LOS (Days) 10 A FACE TO FACE EVALUATION WAS PERFORMED  SWARTZ,ZACHARY T 12/19/2012 8:39 AM

## 2012-12-20 ENCOUNTER — Inpatient Hospital Stay (HOSPITAL_COMMUNITY): Payer: BC Managed Care – PPO

## 2012-12-20 ENCOUNTER — Inpatient Hospital Stay (HOSPITAL_COMMUNITY): Payer: BC Managed Care – PPO | Admitting: Physical Therapy

## 2012-12-20 DIAGNOSIS — R5381 Other malaise: Secondary | ICD-10-CM

## 2012-12-20 LAB — CBC WITH DIFFERENTIAL/PLATELET
Basophils Relative: 0 % (ref 0–1)
HCT: 25.5 % — ABNORMAL LOW (ref 39.0–52.0)
Hemoglobin: 9.2 g/dL — ABNORMAL LOW (ref 13.0–17.0)
MCHC: 36.1 g/dL — ABNORMAL HIGH (ref 30.0–36.0)
Monocytes Absolute: 0.5 10*3/uL (ref 0.1–1.0)
Monocytes Relative: 8 % (ref 3–12)
Neutro Abs: 1.7 10*3/uL (ref 1.7–7.7)

## 2012-12-20 LAB — COMPREHENSIVE METABOLIC PANEL
BUN: 27 mg/dL — ABNORMAL HIGH (ref 6–23)
CO2: 19 mEq/L (ref 19–32)
Chloride: 108 mEq/L (ref 96–112)
Creatinine, Ser: 0.9 mg/dL (ref 0.50–1.35)
GFR calc Af Amer: >90 mL/min (ref >90)
GFR calc non Af Amer: 87 mL/min — ABNORMAL LOW (ref >90)
Glucose, Bld: 93 mg/dL (ref 70–99)
Total Bilirubin: 0.2 mg/dL — ABNORMAL LOW (ref 0.3–1.2)

## 2012-12-20 LAB — PREALBUMIN: Prealbumin: 15.1 mg/dL — ABNORMAL LOW (ref 17.0–34.0)

## 2012-12-20 MED ORDER — MICONAZOLE NITRATE 2 % EX CREA
TOPICAL_CREAM | Freq: Two times a day (BID) | CUTANEOUS | Status: DC
  Filled 2012-12-20: qty 14

## 2012-12-20 MED ORDER — MICONAZOLE NITRATE 2 % EX CREA
TOPICAL_CREAM | Freq: Two times a day (BID) | CUTANEOUS | Status: DC
  Administered 2012-12-20 – 2012-12-29 (×18): via TOPICAL
  Filled 2012-12-20 (×2): qty 14

## 2012-12-20 NOTE — Progress Notes (Signed)
Occupational Therapy Session Note  Patient Details  Name: Timothy [name] MRN: [id number] Date of Birth: 11/11/1946  Today's Date: 12/20/2012 Time: 0900-1000 Time Calculation (min): 60 min  Short Term Goals: Week 2:   STG=LTG secondary to ELOS  Skilled Therapeutic Interventions/Progress Updates:    Pt in bed upon arrival but agreeable to participating in bathing and dressing tasks w/c level at sink. Pt stated he experienced less bowel issues previous night and slept well.  Pt requested to use toilet for bowel movement ("just to be safe") and amb to BR with RW.  Pt completed all bathing and dressing tasks with sit<>stand requiring assistance only with bathing feet and donning socks secondary to time limitations.  Pt requires extra time to complete all tasks requiring multiple rest breaks thorughout session.  Therapy Documentation Precautions:  Precautions Precautions: Fall Precaution Comments: AFIB, chronic leukemia, immuno compromised, graft vs. host disease; LE edema, ace wrap and elevate LE  Restrictions Weight Bearing Restrictions: No   Pain: Pain Assessment Pain Assessment: No/denies pain  See FIM for current functional status  Therapy/Group: Individual Therapy  Rich Brave 12/20/2012, 10:05 AM

## 2012-12-20 NOTE — Progress Notes (Signed)
Patient ID: Timothy [name], male   DOB: 1946/10/28, 66 y.o.   MRN: [id number] Subjective/Complaints:   Formed stool today. Feeling a little better.  Poor intake of meals but drinks shakes Review of Systems   All other systems reviewed and are negative.    Objective: Vital Signs: Blood pressure 108/68, pulse 79, temperature 98.3 F (36.8 C), temperature source Oral, resp. rate 18, height 5\' 7"  (1.702 m), weight 90 kg (198 lb 6.6 oz), SpO2 94.00%. No results found.  Basename 12/20/12 0500  WBC 5.7  HGB 9.2*  HCT 25.5*  PLT 111*    Basename 12/20/12 0500 12/18/12 0448  NA 134* 131*  K 3.7 3.8  CL 108 106  GLUCOSE 93 84  BUN 27* 21  CREATININE 0.90 0.88  CALCIUM 7.1* 7.1*   CBG (last 3)  No results found for this basename: GLUCAP:3 in the last 72 hours  Wt Readings from Last 3 Encounters:  12/20/12 90 kg (198 lb 6.6 oz)  12/05/12 84 kg (185 lb 3 oz)  12/03/12 86.3 kg (190 lb 4.1 oz)    Physical Exam:  Constitutional: He is oriented to person, place, and time. He appears well-developed. pale HENT:  Head: Normocephalic and atraumatic.  Right Ear: External ear normal.  Left Ear: External ear normal.  Nose: Nose normal.  Mouth/Throat: Oropharynx is clear and moist.  Eyes: Conjunctivae normal and EOM are normal. Pupils are equal, round, and reactive to light.  Neck: Normal range of motion. Neck supple.  Cardiovascular: Normal rate, regular rhythm, normal heart sounds and intact distal pulses.  Respiratory: Effort normal and breath sounds normal. No respiratory distress. He has no wheezes. He has no rales. He exhibits no tenderness.  GI: Soft. Bowel sounds are scarce. He exhibits no distension. There is tenderness. There is no rebound and no guarding.  Genitourinary: Penis normal.  Musculoskeletal: Normal range of motion.  1+ edema bilateral pretibial 2+ bilateral feet , left more than right Neurological: He is alert and oriented to person, place, and time. He has normal  reflexes. He displays no tremor. He exhibits normal muscle tone. Gait abnormal.  Motor strength is 4/5 in bilateral deltoid, biceps, triceps, grip 4 minus/5 in bilateral hip flexors knee extensors 4/5 bilateral ankle dorsiflexor plantar flexor some pain in addition with proximal leg movements Skin: Skin is warm and dry.  Psychiatric: He has a normal mood and affect. His behavior is normal. Judgment and thought content normal.    Assessment/Plan: 1. Functional deficits secondary to deconditioning related CLL recent bone marrow transplant and multiple medical conditions which require 3+ hours per day of interdisciplinary therapy in a comprehensive inpatient rehab setting. Physiatrist is providing close team supervision and 24 hour management of active medical problems listed below. Physiatrist and rehab team continue to assess barriers to discharge/monitor patient progress toward functional and medical goals. FIM: FIM - Bathing Bathing Steps Patient Completed: Chest;Right Arm;Left Arm;Abdomen;Front perineal area;Right upper leg;Left upper leg Bathing: 3: Mod-Patient completes 5-7 66f 10 parts or 50-74%  FIM - Upper Body Dressing/Undressing Upper body dressing/undressing steps patient completed: Thread/unthread right sleeve of pullover shirt/dresss;Thread/unthread left sleeve of pullover shirt/dress;Put head through opening of pull over shirt/dress;Pull shirt over trunk Upper body dressing/undressing: 5: Supervision: Safety issues/verbal cues FIM - Lower Body Dressing/Undressing Lower body dressing/undressing steps patient completed: Thread/unthread right underwear leg;Thread/unthread left underwear leg;Pull underwear up/down;Pull pants up/down;Thread/unthread left pants leg;Thread/unthread right pants leg;Don/Doff left sock;Don/Doff right sock Lower body dressing/undressing: 1: Total-Patient completed less than 25% of tasks  FIM - Toileting Toileting steps completed by patient: Adjust clothing  prior to toileting;Performs perineal hygiene;Adjust clothing after toileting Toileting: 1: Total-Patient completed zero steps, helper did all 3  FIM - Diplomatic Services operational officer Devices: Building control surveyor Transfers: 4-To toilet/BSC: Min A (steadying Pt. > 75%);4-From toilet/BSC: Min A (steadying Pt. > 75%)  FIM - Bed/Chair Transfer Bed/Chair Transfer Assistive Devices: Therapist, occupational: 4: Supine > Sit: Min A (steadying Pt. > 75%/lift 1 leg);3: Bed > Chair or W/C: Mod A (lift or lower assist)  FIM - Locomotion: Wheelchair Distance: 150 Locomotion: Wheelchair: 0: Activity did not occur FIM - Locomotion: Ambulation Locomotion: Ambulation Assistive Devices: Designer, industrial/product Ambulation/Gait Assistance: 5: Supervision Locomotion: Ambulation: 2: Travels 50 - 149 ft with supervision/safety issues  Comprehension Comprehension Mode: Auditory Comprehension: 4-Understands basic 75 - 89% of the time/requires cueing 10 - 24% of the time  Expression Expression Mode: Verbal Expression: 5-Expresses basic 90% of the time/requires cueing < 10% of the time.  Social Interaction Social Interaction: 4-Interacts appropriately 75 - 89% of the time - Needs redirection for appropriate language or to initiate interaction.  Problem Solving Problem Solving: 4-Solves basic 75 - 89% of the time/requires cueing 10 - 24% of the time  Memory Memory: 3-Recognizes or recalls 50 - 74% of the time/requires cueing 25 - 49% of the time Medical Problem List and Plan:  1. DVT Prophylaxis/Anticoagulation: Pharmaceutical: Lovenox  2. Pain Management: Acetaminophen  3. Mood: Zoloft ego support 4. CLL/bone marrow transplant/: mgt per oncology 5. GVHD:  Per heme onc, immunosuppresants/antifungals 6. HTN: normodyne with control at present 7. CKD: regular BMET's, monitor I's and O's, weights. 8. Afib: rate controlled at present. Follow for activity tolerance 9. Steroid induced DM:  follow CBG's, SSI as needed 10. Pancytopenia: serial CBC's. Close monitoring of clinical status, no active infection signs at present. Counts appear relatively stable today 11. GI: diarrhea, N/V,. Had formed bm today. Loose stools yesterday  -maintain fluids and electrolytes-  Encouraged supplements  -will schedule immodium, probiotic, has been evaluated by GI during this hospitalization already for diarrhea  -antiemetics, compazine 12. Mood: zoloft   -continue IVF given BUN  -labs are generally  stable today 12.  Anorexia added marinol, monitor intake, really has't improved much 13.  Osteoporosis steroid induced,  L3 fracture new since Sept, pt comfortable when sedentary, LBP with movement.  No need for vertebroplasty.     Hydrocodone is helpful   LOS (Days) 11 A FACE TO FACE EVALUATION WAS PERFORMED  Bethani Brugger T 12/20/2012 9:12 AM

## 2012-12-20 NOTE — Progress Notes (Signed)
Physical Therapy Note  Patient Details  Name: Timothy [name] MRN: [id number] Date of Birth: 1946-09-19 Today's Date: 12/20/2012  1015-1100  (45 minutes) individual Pain: pt reports low back pain during transitional movements/ premedicated Focus of treatment: Therapeutic exercises focused on bilateral LE strengthening/ activity tolerance Treatment: supine to sit using bedrail SBA with increased time; transfers SBA RW ; wc mobility for general activity tolerance 120 feet SBA with increased time; sit to supine (mat) min to SBA with difficulty bilateral LEs; supine to side to sit (mat) SBA with difficulty trunk (pt reports increased back pain during this movement); bilateral LE strengthening X 15 in supine -heel slides, hip abduction, ankle pumps, alternate stepping to 4 inch step with RW support X 10; Nustep Level 3 X 10 minutes for activity tolerance.   1300-1330 (30 minutes) individual Pain : no reported pain Focus of treatment: step training/enduranceTreatment: up /down 6.5 inch steps with one or bilateral rails min assist for safety; X 4 steps with seated rest X 4.   1500-1545 (45 minutes) individual Pain : no complaint of pain Focus of treatment: Standing tolerance; gait training Treatment: Standing tolerance using Wii bowling . Pt able to stand X 3 for maximum of  10 minutes in one session without support of AD; gait 120 feet RW X 1 SBA .    Cathaleen Korol,JIM 12/20/2012, 7:28 AM

## 2012-12-20 NOTE — Progress Notes (Signed)
Patient with only one stool since midnight, PRN lomotil given at that time. Requested and was given lomotil at 0646 this, no BM at that time. Reports best night in relation to no BM's.  Removed ace wraps at bedtime, may need to re-wrap several times during the day due to swelling. Wearing prevalon boots bilaterally and elevated BLE's up on pillows. !/2 NS at 75cc/hr via central line, 7p-7a.. Without complaint of pain. Timothy [name] A

## 2012-12-21 ENCOUNTER — Inpatient Hospital Stay (HOSPITAL_COMMUNITY): Payer: BC Managed Care – PPO

## 2012-12-21 ENCOUNTER — Inpatient Hospital Stay (HOSPITAL_COMMUNITY): Payer: BC Managed Care – PPO | Admitting: Physical Therapy

## 2012-12-21 ENCOUNTER — Inpatient Hospital Stay (HOSPITAL_COMMUNITY): Payer: BC Managed Care – PPO | Admitting: *Deleted

## 2012-12-21 NOTE — Progress Notes (Signed)
Social Work Patient ID: Timothy [name], male   DOB: February 25, 1946, 66 y.o.   MRN: [id number]  Have met with patient and wife to review team conference.  Both aware that team continues to recommend LOS through 12/29/12 with supervision goals overall.  Both feel pt continues to build strength, however, wife and patient feel he still needs ongoing CIR therapy.  They are also aware that I was contacted today by [facility] CM who is beginning to questions team's projected ELS.  I have sent current information to her and will await her decision.  Will keep pt and wife informed of outcome.  Both feel very strongly that pt needs to stay his recommended time.    Karalina Tift, LCSW

## 2012-12-21 NOTE — Progress Notes (Signed)
NT reported to this writer that the patient left hand bleeding from having a skin tear while patient is in the bathroom.  Immediately cleansed the site and applied gauze and tegaderm. Denies any pain. Marissa Nestle , NP notified.Will continue to monitor.

## 2012-12-21 NOTE — Patient Care Conference (Signed)
Inpatient RehabilitationTeam Conference and Plan of Care Update Date: 12/20/2012   Time: 3:30 PM    Patient Name: Timothy [name]      Medical Record Number: [id number]  Date of Birth: 07-Jul-1946 Sex: Male         Room/Bed: 4149/4149-01 Payor Info: Payor: BLUE CROSS BLUE SHIELD  Plan: [facility] HE[facility] PPO  Product Type: *No Product type*     Admitting Diagnosis: DECONDITIONING MULTI MED  Admit Date/Time:  12/09/2012  1:43 PM Admission Comments: No comment available   Primary Diagnosis:  Physical deconditioning Principal Problem: Physical deconditioning  Patient Active Problem List   Diagnosis Date Noted  . Chronic back pain 12/14/2012  . Physical deconditioning 12/09/2012  . 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 Diarrhea in adult patient 12/05/2012  . CLL (chronic lymphocytic leukemia) 12/05/2012  . Rash of face 12/05/2012  . Diarrhea 08/24/2012  . Dehydration 08/24/2012  . Nausea and vomiting 08/24/2012  . Immunocompromised 08/24/2012  . 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 Diverticular disease   . Hyperlipidemia   . Obesity   . Hypertension   . Hiatal hernia   . CMV (cytomegalovirus) antibody positive   . Right bundle branch block   . CKD (chronic kidney disease) stage 2, GFR 60-89 ml/min   . Pancytopenia   . Steroid-induced diabetes   . Atrial fibrillation   . Myopathy   . Fine tremor   . Leukemia, chronic lymphoid 12/09/2011    Expected Discharge Date: Expected Discharge Date: 12/29/12  Team Members Present: Physician leading conference: Dr. Faith Rogue Social Worker Present: Amada Jupiter, LCSW Nurse Present: Other (comment) Kennon Portela, RN) PT Present: Edman Circle, PT OT Present: Bretta Bang, Marye Round, OT;Ardis Rowan, COTA     Current Status/Progress Goal Weekly Team Focus  Medical   persistent malnutrition, diarrhea  increase nutritional status, balance volume status  bulking  stools, increasing nutritional intake   Bowel/Bladder   Pt has diarrhea with stool starting to form. Pt on lomotil and immodium for diarrhea. Pt uses toliet with assistance  remain continent of bowel and bladder.  monitor stools and assist as needed.    Swallow/Nutrition/ Hydration             ADL's   min A LB bathing and dressing; steady A/supervision for transfers; fatigues quickly   supervision/mod I overall   activity tolerance; fucntional amb for home mgmt tasks    Mobility   generally supervision, but can be up to mod assist when fatigued with sit to stand transfers.  Stairs min guard assist 10 2 rails, gait >150' with RW supervision.    modified independent transfers except car SBA; gait supervision 75 feet ; steps 12 min assist 1 rail  leg strengthening, endurance training, stair training, transfer training, furniture transfers, gait with RW.     Communication             Safety/Cognition/ Behavioral Observations            Pain   no complaints of pain         Skin   scattered bruising on extermities, slight reddness to sacrum with turning inplace, right central cath inplace  no further skin breakdown and remain free of infection  monitor and assess skin q shift. pressure relief frequently    Rehab Goals Patient on target to meet rehab goals: Yes *See Interdisciplinary Assessment and Plan and progress notes for long and short-term goals  Barriers to Discharge: reduced stamina and nutritional status,     Possible Resolutions to Barriers:  further assistance at discharge be it family, aide, etc    Discharge Planning/Teaching Needs:  Home with wife to provide any needed assistance      Team Discussion:  Making slow progress - continues very deconditioned.  Revisions to Treatment Plan:  None   Continued Need for Acute Rehabilitation Level of Care: The patient requires daily medical management by a physician with specialized training in physical medicine and  rehabilitation for the following conditions: Daily direction of a multidisciplinary physical rehabilitation program to ensure safe treatment while eliciting the highest outcome that is of practical value to the patient.: Yes Daily medical management of patient stability for increased activity during participation in an intensive rehabilitation regime.: Yes Daily analysis of laboratory values and/or radiology reports with any subsequent need for medication adjustment of medical intervention for : Neurological problems;Other (nutritional, oncological)  Timothy [name] 12/21/2012, 4:03 PM

## 2012-12-21 NOTE — Progress Notes (Signed)
Occupational Therapy Session Note  Patient Details  Name: Timothy [name] MRN: [id number] Date of Birth: 1946-01-10  Today's Date: 12/21/2012  Session 1 Time: 1100-1155 Time Calculation (min): 55 min  Short Term Goals: Week 2:   STG=LTG secondary to ELOS  Skilled Therapeutic Interventions/Progress Updates:    Pt seated in recliner upon arrival; agreeable to bathing and dressing w/c level at sink.  Pt stated he took showers about every four days at home and currently he does not want to try until endurance is better.  Pt stated he slept good last night and is encouraged that his bowels seem to be better controlled.  Pt required rest breaks throughout session.  Pt would sit at sink occasionally and ask "what was I doing."  Pt attributes these periods of lapses in concentration to decreased stamina.  Focus on activity tolerance, dynamic standing balance, and safety awareness.  Therapy Documentation Precautions:  Precautions Precautions: Fall Precaution Comments: AFIB, chronic leukemia, immuno compromised, graft vs. host disease; LE edema, ace wrap and elevate LE  Restrictions Weight Bearing Restrictions: No   Pain: Pain Assessment Pain Assessment: No/denies pain Pain Score: 0-No pain  See FIM for current functional status  Therapy/Group: Individual Therapy  Session 2 ![phone] Pt denies pain Individual Therapy Pt resting in recliner.  Pt amb with RW to ADL apartment for standing tasks at kitchen counter before resting and ambulating to gym with RW.  Pt engaged in standing balance tasks including tossing ball to rehab tech.  Pt did not display any LOB during activity but did comment that task was exhausting.  After rest pt amb with RW from gym to room without rest break.  Pt encouraged by afternoon therapy.  Discussed with patient the importance of having a schedule at home that allowed for rest breaks and periods of activity throughout day.  Lavone Neri [facility] 12/21/2012,  12:00 PM

## 2012-12-21 NOTE — Progress Notes (Signed)
Physical Therapy Session Note  Patient Details  Name: Timothy [name] MRN: [id number] Date of Birth: September 27, 1946  Today's Date: 12/21/2012 Time: 7829-5621 Time Calculation (min): 28 min  Short Term Goals: Week 2:  PT Short Term Goal 1 (Week 2): = LTGs  Skilled Therapeutic Interventions/Progress Updates:    Treatment focused on gait training on unit > 300', seated rest break and then >250' with S with RW for general endurance and strengthening. HR = 84-85 bpm with activity and O2 97-98% with "a little winded but not much" as subjective report in terms of exertion. Pt S with all transfers, returned back to bed end of session with LE's elevated for edema control.   Therapy Documentation Precautions:  Precautions Precautions: Fall Precaution Comments: AFIB, chronic leukemia, immuno compromised, graft vs. host disease; LE edema, ace wrap and elevate LE  Restrictions Weight Bearing Restrictions: No   Pain: Pain Assessment Pain Assessment: No/denies pain    See FIM for current functional status  Therapy/Group: Individual Therapy  [name] [facility] 12/21/2012, 2:43 PM

## 2012-12-21 NOTE — Progress Notes (Signed)
Physical Therapy Session Note  Patient Details  Name: Lathon Adan MRN: [id number] Date of Birth: [birth year]  Today's Date: 12/21/2012 Time: 0830-0900 Time Calculation (min): 30 min  Short Term Goals: Week 2:  PT Short Term Goal 1 (Week 2): = LTGs  Skilled Therapeutic Interventions/Progress Updates:  Patient transferred supine to sit with supervision with [facility] elevated and use of bedrails; therapist applied ACE wraps to bilateral LE's for edema control. Ambulation for endurance and strengthening with RW and supervision 75'.  Patient ascended/descended 6.5" step with 2 handrails with step-to gait pattern leading with right LE.  Seated rest break, then ascended/descended 6.5" step with 1 handrail to simulate entry to home side-stepping leading with right LE.  Patient propelled WC 75' back to room.  Supervision for transfer back to bed at end of session with mod A to transfer sit to supine. Bilateral LE's elevated on pillows end of session for edema management.    Therapy Documentation Precautions:  Precautions Precautions: Fall Precaution Comments: AFIB, chronic leukemia, immuno compromised, graft vs. host disease; LE edema, ace wrap and elevate LE  Restrictions Weight Bearing Restrictions: No   Pain: Pain Assessment Pain Assessment: No/denies pain Pain Score: 0-No pain   Locomotion : Ambulation Ambulation/Gait Assistance: 5: Supervision   See FIM for current functional status  Therapy/Group: Individual Therapy  Rexene Agent 12/21/2012, 12:09 PM

## 2012-12-21 NOTE — Progress Notes (Signed)
Physical Therapy Session Note  Patient Details  Name: Timothy [name] MRN: [id number] Date of Birth: 1946-12-01  Today's Date: 12/21/2012 Time: 1003-1049 Time Calculation (min): 46 min  Short Term Goals: Week 2:  PT Short Term Goal 1 (Week 2): = LTGs  Skilled Therapeutic Interventions/Progress Updates:   This session focused on gait to gym from room with supervision >150' shuffling gait pattern with cues to stay closer to RW and for upright posture.  Bed mobility supervision to get to sitting using bedrail and extra time needed.  Transfers to and from [facility] supervision with RW, sit to stand transfers in ADL apartment from different height surfaces min up to mod assist with fatigue, lower chair height and no armrests.  Stairs up and down 5, 4-6" height stairs with 2 rails min assist.  WC mobility 250' down to 4N up/down 3% graded ramp with supervision, cues needed for backing up and turning around.  Exercises: seated heel raises/toe raises, LAQs with 5" holds, marches, chair push-ups x 10 each bil.  Toileting on 3-in-1 in bath room min assist for dynamic balance while pulling up pants with no hands on RW.    Therapy Documentation Precautions:  Precautions Precautions: Fall Precaution Comments: AFIB, chronic leukemia, immuno compromised, graft vs. host disease; LE edema, ace wrap and elevate LE  Restrictions Weight Bearing Restrictions: No   Pain: Pain Assessment Pain Assessment: No/denies pain Pain Score: 0-No pain   Locomotion : Ambulation Ambulation/Gait Assistance: 5: Supervision Wheelchair Mobility Distance: 250   See FIM for current functional status  Therapy/Group: Individual Therapy  Lurena Joiner B. Ebonie Westerlund, PT, DPT [phone]   12/21/2012, 12:59 PM

## 2012-12-21 NOTE — Progress Notes (Signed)
Patient ID: Timothy [name], male   DOB: [birth year], 66 y.o.   MRN: [id number] Subjective/Complaints:  no major complaints. Continued GI and nutritional issues  Review of Systems   All other systems reviewed and are negative.    Objective: Vital Signs: Blood pressure 111/68, pulse 83, temperature 98.5 F (36.9 C), temperature source Oral, resp. rate 18, height 5\' 7"  (1.702 m), weight 85.276 kg (188 lb), SpO2 94.00%. No results found.  Basename 12/20/12 0500  WBC 5.7  HGB 9.2*  HCT 25.5*  PLT 111*    Basename 12/20/12 0500  NA 134*  K 3.7  CL 108  GLUCOSE 93  BUN 27*  CREATININE 0.90  CALCIUM 7.1*   CBG (last 3)  No results found for this basename: GLUCAP:3 in the last 72 hours  Wt Readings from Last 3 Encounters:  12/21/12 85.276 kg (188 lb)  12/05/12 84 kg (185 lb 3 oz)  12/03/12 86.3 kg (190 lb 4.1 oz)    Physical Exam:  Constitutional: He is oriented to person, place, and time. He appears well-developed. pale HENT:  Head: Normocephalic and atraumatic.  Right Ear: External ear normal.  Left Ear: External ear normal.  Nose: Nose normal.  Mouth/Throat: Oropharynx is clear and moist.  Eyes: Conjunctivae normal and EOM are normal. Pupils are equal, round, and reactive to light.  Neck: Normal range of motion. Neck supple.  Cardiovascular: Normal rate, regular rhythm, normal heart sounds and intact distal pulses.  Respiratory: Effort normal and breath sounds normal. No respiratory distress. He has no wheezes. He has no rales. He exhibits no tenderness.  GI: Soft. Bowel sounds are scarce. He exhibits no distension. There is tenderness. There is no rebound and no guarding.  Genitourinary: Penis normal.  Musculoskeletal: Normal range of motion.  1+ edema bilateral pretibial 2+ bilateral feet , left more than right Neurological: He is alert and oriented to person, place, and time. He has normal reflexes. He displays no tremor. He exhibits normal muscle tone. Gait  abnormal.  Motor strength is 4/5 in bilateral deltoid, biceps, triceps, grip 4 minus/5 in bilateral hip flexors knee extensors 4/5 bilateral ankle dorsiflexor plantar flexor some pain in addition with proximal leg movements Skin: Skin is warm and dry.  Psychiatric: He has a normal mood and affect. His behavior is normal. Judgment and thought content normal.    Assessment/Plan: 1. Functional deficits secondary to deconditioning related CLL recent bone marrow transplant and multiple medical conditions which require 3+ hours per day of interdisciplinary therapy in a comprehensive inpatient rehab setting. Physiatrist is providing close team supervision and 24 hour management of active medical problems listed below. Physiatrist and rehab team continue to assess barriers to discharge/monitor patient progress toward functional and medical goals. FIM: FIM - Bathing Bathing Steps Patient Completed: Chest;Right Arm;Left Arm;Abdomen;Front perineal area;Buttocks;Right upper leg;Left upper leg Bathing: 4: Min-Patient completes 8-9 50f 10 parts or 75+ percent  FIM - Upper Body Dressing/Undressing Upper body dressing/undressing steps patient completed: Thread/unthread right sleeve of pullover shirt/dresss;Thread/unthread left sleeve of pullover shirt/dress;Put head through opening of pull over shirt/dress;Pull shirt over trunk Upper body dressing/undressing: 5: Set-up assist to: Obtain clothing/put away FIM - Lower Body Dressing/Undressing Lower body dressing/undressing steps patient completed: Thread/unthread right pants leg;Thread/unthread left pants leg;Pull pants up/down Lower body dressing/undressing: 3: Mod-Patient completed 50-74% of tasks  FIM - Toileting Toileting steps completed by patient: Adjust clothing prior to toileting;Performs perineal hygiene;Adjust clothing after toileting Toileting: 5: Supervision: Safety issues/verbal cues  FIM - Archivist  Transfers Assistive Devices:  Grab bars;Walker Toilet Transfers: 6-To toilet/ BSC  FIM - Banker Devices: Therapist, occupational: 5: Supine > Sit: Supervision (verbal cues/safety issues);5: Bed > Chair or W/C: Supervision (verbal cues/safety issues)  FIM - Locomotion: Wheelchair Distance: 150 Locomotion: Wheelchair: 0: Activity did not occur FIM - Locomotion: Ambulation Locomotion: Ambulation Assistive Devices: Designer, industrial/product Ambulation/Gait Assistance: 5: Supervision Locomotion: Ambulation: 2: Travels 50 - 149 ft with supervision/safety issues  Comprehension Comprehension Mode: Auditory Comprehension: 5-Follows basic conversation/direction: With no assist  Expression Expression Mode: Verbal Expression: 5-Expresses basic needs/ideas: With no assist  Social Interaction Social Interaction: 5-Interacts appropriately 90% of the time - Needs monitoring or encouragement for participation or interaction.  Problem Solving Problem Solving: 4-Solves basic 75 - 89% of the time/requires cueing 10 - 24% of the time  Memory Memory: 4-Recognizes or recalls 75 - 89% of the time/requires cueing 10 - 24% of the time Medical Problem List and Plan:  1. DVT Prophylaxis/Anticoagulation: Pharmaceutical: Lovenox  2. Pain Management: Acetaminophen  3. Mood: Zoloft ego support 4. CLL/bone marrow transplant/: mgt per oncology 5. GVHD:  Per heme onc, immunosuppresants/antifungals 6. HTN: normodyne with control at present 7. CKD: regular BMET's, monitor I's and O's, weights. 8. Afib: rate controlled at present. Follow for activity tolerance 9. Steroid induced DM: follow CBG's, SSI as needed 10. Pancytopenia: serial CBC's. Close monitoring of clinical status, no active infection signs at present. Counts appear relatively stable today 11. GI: diarrhea, N/V,. Stools still generally loose but showing decreased frequency and some increase in form  -maintain fluids and electrolytes-   Encouraged supplements  -  scheduled immodium, probiotic, has been evaluated by GI during this hospitalization already for diarrhea  -antiemetics, compazine 12. Mood: zoloft   -continue IVF given BUN  -labs are generally stable 12.  Anorexia added marinol, monitor intake, really has't improved much 13.  Osteoporosis steroid induced,  L3 fracture new since Sept, pt comfortable when sedentary, LBP with movement.  No need for vertebroplasty.     Hydrocodone is helpful   LOS (Days) 12 A FACE TO FACE EVALUATION WAS PERFORMED  SWARTZ,ZACHARY T 12/21/2012 8:55 AM

## 2012-12-21 NOTE — Evaluation (Signed)
Recreational Therapy Assessment and Plan  Patient Details  Name: Timothy [name] MRN: [id number] Date of Birth: [birth year] Today's Date: 12/21/2012  Rehab Potential: Good ELOS: 2 weeks   Assessment Clinical Impression:Problem List:  Patient Active Problem List   Diagnosis   .  Leukemia, chronic lymphoid   .  Transplant recipient   .  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  Diverticular disease   .  Hyperlipidemia   .  Obesity   .  Hypertension   .  Hiatal hernia   .  CMV (cytomegalovirus) antibody positive   .  Right bundle branch block   .  CKD (chronic kidney disease) stage 2, GFR 60-89 ml/min   .  Pancytopenia   .  Steroid-induced diabetes   .  Atrial fibrillation   .  Myopathy   .  Fine tremor   .  Diarrhea   .  Dehydration   .  Nausea and vomiting   .  Immunocompromised   .  Chronic GVHD complicating bone marrow transplantation   .  Diarrhea in adult patient   .  CLL (chronic lymphocytic leukemia)   .  Rash of face   .  Physical deconditioning    Past Medical History:  Past Medical History   Diagnosis  Date   .  Transplant recipient  07/12/2012   .  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  Diverticular disease    .  Hyperlipidemia    .  Obesity    .  Hypertension    .  Hiatal hernia    .  CMV (cytomegalovirus) antibody positive      pre-transplant, with seroconversion x2 pst-transplant   .  Right bundle branch block      pre-transplant   .  CKD (chronic kidney disease) stage 2, GFR 60-89 ml/min    .  Pancytopenia    .  Steroid-induced diabetes    .  Atrial fibrillation      post-transplant   .  Myopathy    .  Fine tremor      likely secondary to tacrolimus   .  Leukemia, chronic lymphoid    .  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  Chronic GVHD complicating bone marrow transplantation  12/05/2012   .  Diarrhea in adult patient  12/05/2012     Due to active GVHD   .  CLL (chronic lymphocytic leukemia)  12/05/2012     Dx 07/1999; started Rx 12/06  AlloBMT 3/13   .  Rash of face  12/05/2012     Due to GVHD    Past Surgical History:  Past Surgical History   Procedure  Date   .  Tonsillectomy and adenoidectomy    .  Bone marrow transplant    .  Flexible sigmoidoscopy  11/17/2012     Procedure: FLEXIBLE SIGMOIDOSCOPY; Surgeon: Petra Kuba, MD; Location: [facility]; Service: Endoscopy; Laterality: N/A; Dr Ewing Schlein states will be admitted to rooom 1339 11/16/12   .  Esophagogastroduodenoscopy  11/17/2012     Procedure: ESOPHAGOGASTRODUODENOSCOPY (EGD); Surgeon: Petra Kuba, MD; Location: [facility]; Service: Endoscopy; Laterality: N/A;    Assessment & Plan  Clinical Impression: Patient is a 66 y.o. male with CLL s/p allogenic stem cell transplant, reactivation of CMV who has had ongoing problems with diarrhea and weight loss. Recent work up consistent with graft v/s host disease as well as active gastritis and mild distal esophagitis. He was readmitted on 12/05/12 with recurrent  diarrhea. He treated with hydration and short course of solu-medrol. Patient transferred to CIR on 12/09/2012 .    Pt presents with decreased activity tolerance, decreased functional mobility, decreased balance limiting pt's independence with leisure/community pursuits.  Leisure History/Participation Premorbid leisure interest/current participation: Crafts - Woodworking;Crafts - Other (Comment);Community - Journalist, newspaper - Engineer, civil (consulting) (pottery) Other Leisure Interests: Reading Leisure Participation Style: Alone Psychosocial / Spiritual Patient agreeable to Pet Therapy: Yes Social interaction - Mood/Behavior: Cooperative Film/video editor for Education?: Yes Recreational Therapy Orientation Orientation -Reviewed with patient: Available activity resources Strengths/Weaknesses Patient Strengths/Abilities: Willingness to participate Patient weaknesses: Physical limitations  Plan Rec Therapy Plan Is patient appropriate for  Therapeutic Recreation?: Yes Rehab Potential: Good Treatment times per week: Min 1 time per week >20 minutes Estimated Length of Stay: 2 weeks TR Treatment/Interventions: Adaptive equipment instruction;1:1 session;Balance/vestibular training;Community reintegration;Functional mobility training;Patient/family education;Recreation/leisure participation;Therapeutic activities;Therapeutic exercise  Recommendations for other services: None  Discharge Criteria: Patient will be discharged from TR if patient refuses treatment 3 consecutive times without medical reason.  If treatment goals not met, if there is a change in medical status, if patient makes no progress towards goals or if patient is discharged from hospital.  The above assessment, treatment plan, treatment alternatives and goals were discussed and mutually agreed upon: by patient  Timothy [name] 12/21/2012, 4:25 PM

## 2012-12-22 ENCOUNTER — Inpatient Hospital Stay (HOSPITAL_COMMUNITY): Payer: BC Managed Care – PPO

## 2012-12-22 DIAGNOSIS — R5381 Other malaise: Secondary | ICD-10-CM

## 2012-12-22 NOTE — Progress Notes (Signed)
Occupational Therapy Session Note  Patient Details  Name: Damari Suastegui MRN: [id number] Date of Birth: 1946/01/04  Today's Date: 12/22/2012 Time: 1500-1530 Time Calculation (min): 30 min   Skilled Therapeutic Interventions/Progress Updates:    Wife visiting upon therapy arrival. Pt agreeable to participate in tx session. Patient participated in toilet transfer and toileting at supervision level. Patient participated in Bil UE strengthening activity on Nu Step. See exercise below. Patient propelled self in w/c from room to gym and back to increase UB strength and endurance. Patient requested to return to bed at end of session. PRAFO boots donned and call light placed within reach.  Therapy Documentation Precautions:  Precautions Precautions: Fall Precaution Comments: AFIB, chronic leukemia, immuno compromised, graft vs. host disease; LE edema, ace wrap and elevate LE  Restrictions Weight Bearing Restrictions: No Pain: Pain Assessment Pain Assessment: No/denies pain Exercises: Cardiovascular Exercises NuStep: Bil UE; 11'; workload 5  See FIM for current functional status  Therapy/Group: Individual Therapy  Limmie Patricia, OTR/L 12/22/2012, 3:52 PM

## 2012-12-22 NOTE — Progress Notes (Signed)
Patient ID: Timothy [name], male   DOB: 30-Aug-1946, 67 y.o.   MRN: [id number] Subjective/Complaints: Feeling better. Stools a little more formed. Nausea decreased  Review of Systems   All other systems reviewed and are negative.    Objective: Vital Signs: Blood pressure 149/73, pulse 67, temperature 97.8 F (36.6 C), temperature source Oral, resp. rate 19, height 5\' 7"  (1.702 m), weight 87.635 kg (193 lb 3.2 oz), SpO2 95.00%. No results found.  Basename 12/20/12 0500  WBC 5.7  HGB 9.2*  HCT 25.5*  PLT 111*    Basename 12/20/12 0500  NA 134*  K 3.7  CL 108  GLUCOSE 93  BUN 27*  CREATININE 0.90  CALCIUM 7.1*   CBG (last 3)  No results found for this basename: GLUCAP:3 in the last 72 hours  Wt Readings from Last 3 Encounters:  12/22/12 87.635 kg (193 lb 3.2 oz)  12/05/12 84 kg (185 lb 3 oz)  12/03/12 86.3 kg (190 lb 4.1 oz)    Physical Exam:  Constitutional: He is oriented to person, place, and time. He appears well-developed. pale HENT:  Head: Normocephalic and atraumatic.  Right Ear: External ear normal.  Left Ear: External ear normal.  Nose: Nose normal.  Mouth/Throat: Oropharynx is clear and moist.  Eyes: Conjunctivae normal and EOM are normal. Pupils are equal, round, and reactive to light.  Neck: Normal range of motion. Neck supple.  Cardiovascular: Normal rate, regular rhythm, normal heart sounds and intact distal pulses.  Respiratory: Effort normal and breath sounds normal. No respiratory distress. He has no wheezes. He has no rales. He exhibits no tenderness.  GI: Soft. Bowel sounds are scarce. He exhibits no distension. There is tenderness. There is no rebound and no guarding.  Genitourinary: Penis normal.  Musculoskeletal: Normal range of motion.  1+ edema bilateral pretibial 2+ bilateral feet , left more than right Neurological: He is alert and oriented to person, place, and time. He has normal reflexes. He displays no tremor. He exhibits normal muscle  tone. Gait abnormal.  Motor strength is 4/5 in bilateral deltoid, biceps, triceps, grip 4 minus/5 in bilateral hip flexors knee extensors 4/5 bilateral ankle dorsiflexor plantar flexor some pain in addition with proximal leg movements Skin: Skin is warm and dry.  Psychiatric: He has a normal mood and affect. His behavior is normal. Judgment and thought content normal.    Assessment/Plan: 1. Functional deficits secondary to deconditioning related CLL recent bone marrow transplant and multiple medical conditions which require 3+ hours per day of interdisciplinary therapy in a comprehensive inpatient rehab setting. Physiatrist is providing close team supervision and 24 hour management of active medical problems listed below. Physiatrist and rehab team continue to assess barriers to discharge/monitor patient progress toward functional and medical goals. FIM: FIM - Bathing Bathing Steps Patient Completed: Chest;Right Arm;Left Arm;Abdomen;Front perineal area;Buttocks;Right upper leg;Left upper leg Bathing: 4: Min-Patient completes 8-9 57f 10 parts or 75+ percent  FIM - Upper Body Dressing/Undressing Upper body dressing/undressing steps patient completed: Thread/unthread right sleeve of pullover shirt/dresss;Thread/unthread left sleeve of pullover shirt/dress;Put head through opening of pull over shirt/dress;Pull shirt over trunk Upper body dressing/undressing: 5: Set-up assist to: Obtain clothing/put away FIM - Lower Body Dressing/Undressing Lower body dressing/undressing steps patient completed: Thread/unthread right pants leg;Thread/unthread left pants leg;Pull pants up/down Lower body dressing/undressing: 3: Mod-Patient completed 50-74% of tasks  FIM - Toileting Toileting steps completed by patient: Adjust clothing prior to toileting;Performs perineal hygiene;Adjust clothing after toileting Toileting Assistive Devices: Grab bar or rail for  support Toileting: 5: Supervision: Safety issues/verbal  cues  FIM - Diplomatic Services operational officer Devices: Grab bars;Walker Toilet Transfers: 6-To toilet/ BSC  FIM - Banker Devices: Therapist, occupational: 3: Sit > Supine: Mod A (lifting assist/Pt. 50-74%/lift 2 legs);5: Chair or W/C > Bed: Supervision (verbal cues/safety issues)  FIM - Locomotion: Wheelchair Distance: 250 Locomotion: Wheelchair: 5: Travels 150 ft or more: maneuvers on rugs and over door sills with supervision, cueing or coaxing FIM - Locomotion: Ambulation Locomotion: Ambulation Assistive Devices: Designer, industrial/product Ambulation/Gait Assistance: 5: Supervision Locomotion: Ambulation: 5: Travels 150 ft or more with supervision/safety issues  Comprehension Comprehension Mode: Auditory Comprehension: 5-Follows basic conversation/direction: With no assist  Expression Expression Mode: Verbal Expression: 5-Expresses basic needs/ideas: With no assist  Social Interaction Social Interaction: 5-Interacts appropriately 90% of the time - Needs monitoring or encouragement for participation or interaction.  Problem Solving Problem Solving: 4-Solves basic 75 - 89% of the time/requires cueing 10 - 24% of the time  Memory Memory: 4-Recognizes or recalls 75 - 89% of the time/requires cueing 10 - 24% of the time Medical Problem List and Plan:  1. DVT Prophylaxis/Anticoagulation: Pharmaceutical: Lovenox  2. Pain Management: Acetaminophen  3. Mood: Zoloft ego support 4. CLL/bone marrow transplant/: mgt per oncology 5. GVHD:  Per heme onc, immunosuppresants/antifungals 6. HTN: normodyne with control at present 7. CKD: regular BMET's, monitor I's and O's, weights. 8. Afib: rate controlled at present. Follow for activity tolerance 9. Steroid induced DM: follow CBG's, SSI as needed 10. Pancytopenia: serial CBC's. Close monitoring of clinical status, no active infection signs at present. Counts appear relatively stable today 11.  GI: diarrhea, N/V,. Stools still generally loose but showing decreased frequency and some increase in form  -maintain fluids and electrolytes-  Encouraged supplements  -  scheduled immodium, probiotic, has been evaluated by GI during this hospitalization already for diarrhea  -antiemetics, compazine 12. Mood: zoloft   -continue IVF given BUN  -labs are generally stable  -stools have improved 12.  Anorexia added marinol, monitor intake, really has't improved much 13.  Osteoporosis steroid induced,  L3 fracture new since Sept, pt comfortable when sedentary, LBP with movement.  No need for vertebroplasty.     Hydrocodone is helpful   LOS (Days) 13 A FACE TO FACE EVALUATION WAS PERFORMED  Jamelyn Bovard T 12/22/2012 9:48 AM

## 2012-12-23 ENCOUNTER — Inpatient Hospital Stay (HOSPITAL_COMMUNITY): Payer: BC Managed Care – PPO

## 2012-12-23 DIAGNOSIS — R197 Diarrhea, unspecified: Secondary | ICD-10-CM

## 2012-12-23 DIAGNOSIS — D89813 Graft-versus-host disease, unspecified: Secondary | ICD-10-CM

## 2012-12-23 DIAGNOSIS — T8600 Unspecified complication of bone marrow transplant: Secondary | ICD-10-CM

## 2012-12-23 LAB — TACROLIMUS LEVEL: Tacrolimus (FK506) - LabCorp: 10 ng/mL

## 2012-12-23 NOTE — Progress Notes (Signed)
Recreational Therapy Session Note  Patient Details  Name: Timothy [name] MRN: [id number] Date of Birth: January 28, 1946 Today's Date: 12/23/2012 Time:  3086-5784 Pain: no c/o Skilled Therapeutic Interventions/Progress Updates: Pt ambulated with RW from room to gym with supervision. Session focused on activity tolerance & balance.  Pt stood with RW to kick a ball alternating feet with supervision.  Pt stood with RW alternating between kicking a ball with alternate feet and tossing/catching a ball with contact guard assist.  Pt stood without UE support to kick a ball with alternate feet with contact guard assist.  Pt ambulated with RW while kicking a ball with alternate feet with contact guard assist.  Therapy/Group: Co-Treatment  Breeonna Mone 12/23/2012, 12:00 PM

## 2012-12-23 NOTE — Progress Notes (Signed)
Physical Therapy Session Note  Patient Details  Name: Timothy [name] MRN: [id number] Date of Birth: 10/22/1946  Today's Date: 12/23/2012 Time: 4098-1191 Time Calculation (min): 43 min  Skilled Therapeutic Interventions/Progress Updates:    Treatment session focused on dynamic standing balance and overall activity tolerance. Gait to/from therapy gym with S using RW, no standing rest breaks needed. Dynamic standing balance activity to kick ball alternating LE's with S using RW and throw and catch with bilateral UE's with S. When attempted to kick ball without UE support, required steady A. Gait with RW and kicking the ball with S. Seated therex for LAQ, marches, and heel/toe raises x 10 reps x 2 sets each.   Therapy Documentation Precautions:  Precautions Precautions: Fall Precaution Comments: AFIB, chronic leukemia, immuno compromised, graft vs. host disease; LE edema, ace wrap and elevate LE  Restrictions Weight Bearing Restrictions: No   Pain: Pain Assessment Pain Assessment: No/denies pain Locomotion : Ambulation Ambulation/Gait Assistance: 5: Supervision   See FIM for current functional status  Therapy/Group: Individual Therapy and Co-Treatment with Recreational therapy  [name] [facility] 12/23/2012, 11:32 AM

## 2012-12-23 NOTE — Progress Notes (Signed)
Occupational Therapy Session Note  Patient Details  Name: Timothy [name] MRN: [id number] Date of Birth: 02-05-1946  Today's Date: 12/23/2012 Time: 0800-0900 Time Calculation (min): 60 min   Skilled Therapeutic Interventions/Progress Updates:    Pt in bed upon arrival and stated he was ready to "get things going."  Pt amb with RW to sink for dressing tasks only this morning.  Pt doffed and donned pullover shirt while standing at sink - no LOB observed.  Pt completed grooming tasks seated at sink.  Pt required use of AE threading pants this morning secondary to BLE edema.  After dressing Ace wraps applied to BLE.  Pt amb with RW to gym to engaged in standing activities without BUE support.  Pt required only one rest break during activity of approx 10 mins.  Pt amb with RW from gym to room and left in recliner with BLE elevated.  Pt reports he felt more energetic this morning.  Pt required less rest breaks throughout session this morning.  Therapy Documentation Precautions:  Precautions Precautions: Fall Precaution Comments: AFIB, chronic leukemia, immuno compromised, graft vs. host disease; LE edema, ace wrap and elevate LE  Restrictions Weight Bearing Restrictions: No   Pain: Pain Assessment Pain Assessment: No/denies pain See FIM for current functional status  Therapy/Group: Individual Therapy  Rich Brave 12/23/2012, 9:29 AM

## 2012-12-23 NOTE — Progress Notes (Signed)
Patient ID: Timothy [name], male   DOB: April 05, 1946, 67 y.o.   MRN: [id number] Subjective/Complaints: Feeling stronger. Appetite better. Stools more formed.  Review of Systems   All other systems reviewed and are negative.    Objective: Vital Signs: Blood pressure 144/81, pulse 65, temperature 97.6 F (36.4 C), temperature source Oral, resp. rate 17, height 5\' 7"  (1.702 m), weight 89 kg (196 lb 3.4 oz), SpO2 95.00%. No results found. No results found for this basename: WBC:2,HGB:2,HCT:2,PLT:2 in the last 72 hours No results found for this basename: NA:2,K:2,CL:2,CO:2,GLUCOSE:2,BUN:2,CREATININE:2,CALCIUM:2 in the last 72 hours CBG (last 3)  No results found for this basename: GLUCAP:3 in the last 72 hours  Wt Readings from Last 3 Encounters:  12/23/12 89 kg (196 lb 3.4 oz)  12/05/12 84 kg (185 lb 3 oz)  12/03/12 86.3 kg (190 lb 4.1 oz)    Physical Exam:  Constitutional: He is oriented to person, place, and time. He appears well-developed. pale HENT:  Head: Normocephalic and atraumatic.  Right Ear: External ear normal.  Left Ear: External ear normal.  Nose: Nose normal.  Mouth/Throat: Oropharynx is clear and moist.  Eyes: Conjunctivae normal and EOM are normal. Pupils are equal, round, and reactive to light.  Neck: Normal range of motion. Neck supple.  Cardiovascular: Normal rate, regular rhythm, normal heart sounds and intact distal pulses.  Respiratory: Effort normal and breath sounds normal. No respiratory distress. He has no wheezes. He has no rales. He exhibits no tenderness.  GI: Soft. Bowel sounds are scarce. He exhibits no distension. There is tenderness. There is no rebound and no guarding.  Genitourinary: Penis normal.  Musculoskeletal: Normal range of motion.  1+ edema bilateral pretibial 2+ bilateral feet , left more than right Neurological: He is alert and oriented to person, place, and time. He has normal reflexes. He displays no tremor. He exhibits normal muscle  tone. Gait abnormal.  Motor strength is 4/5 in bilateral deltoid, biceps, triceps, grip 4 minus/5 in bilateral hip flexors knee extensors 4/5 bilateral ankle dorsiflexor plantar flexor some pain in addition with proximal leg movements Skin: Skin is warm and dry.  Psychiatric: He has a normal mood and affect. His behavior is normal. Judgment and thought content normal.    Assessment/Plan: 1. Functional deficits secondary to deconditioning related CLL recent bone marrow transplant and multiple medical conditions which require 3+ hours per day of interdisciplinary therapy in a comprehensive inpatient rehab setting. Physiatrist is providing close team supervision and 24 hour management of active medical problems listed below. Physiatrist and rehab team continue to assess barriers to discharge/monitor patient progress toward functional and medical goals. FIM: FIM - Bathing Bathing Steps Patient Completed: Chest;Right Arm;Left Arm;Abdomen;Front perineal area;Buttocks;Right upper leg;Left upper leg Bathing: 4: Min-Patient completes 8-9 22f 10 parts or 75+ percent  FIM - Upper Body Dressing/Undressing Upper body dressing/undressing steps patient completed: Thread/unthread right sleeve of pullover shirt/dresss;Thread/unthread left sleeve of pullover shirt/dress;Put head through opening of pull over shirt/dress;Pull shirt over trunk Upper body dressing/undressing: 5: Set-up assist to: Obtain clothing/put away FIM - Lower Body Dressing/Undressing Lower body dressing/undressing steps patient completed: Thread/unthread right pants leg;Thread/unthread left pants leg;Pull pants up/down Lower body dressing/undressing: 3: Mod-Patient completed 50-74% of tasks  FIM - Toileting Toileting steps completed by patient: Adjust clothing prior to toileting;Performs perineal hygiene;Adjust clothing after toileting Toileting Assistive Devices: Grab bar or rail for support Toileting: 5: Supervision: Safety issues/verbal  cues  FIM - Diplomatic Services operational officer Devices: Elevated toilet seat;Walker Toilet Transfers: 5-To  toilet/BSC: Supervision (verbal cues/safety issues);5-From toilet/BSC: Supervision (verbal cues/safety issues)  FIM - Banker Devices: Walker;Bed rails;Arm rests;HOB elevated Bed/Chair Transfer: 5: Supine > Sit: Supervision (verbal cues/safety issues);3: Sit > Supine: Mod A (lifting assist/Pt. 50-74%/lift 2 legs);5: Chair or W/C > Bed: Supervision (verbal cues/safety issues)  FIM - Locomotion: Wheelchair Distance: 250 Locomotion: Wheelchair: 5: Travels 150 ft or more: maneuvers on rugs and over door sills with supervision, cueing or coaxing FIM - Locomotion: Ambulation Locomotion: Ambulation Assistive Devices: Designer, industrial/product Ambulation/Gait Assistance: 5: Supervision Locomotion: Ambulation: 5: Travels 150 ft or more with supervision/safety issues  Comprehension Comprehension Mode: Auditory Comprehension: 5-Follows basic conversation/direction: With no assist  Expression Expression Mode: Verbal Expression: 5-Expresses basic needs/ideas: With no assist  Social Interaction Social Interaction: 5-Interacts appropriately 90% of the time - Needs monitoring or encouragement for participation or interaction.  Problem Solving Problem Solving: 4-Solves basic 75 - 89% of the time/requires cueing 10 - 24% of the time  Memory Memory: 4-Recognizes or recalls 75 - 89% of the time/requires cueing 10 - 24% of the time Medical Problem List and Plan:  1. DVT Prophylaxis/Anticoagulation: Pharmaceutical: Lovenox  2. Pain Management: Acetaminophen  3. Mood: Zoloft ego support 4. CLL/bone marrow transplant/: mgt per oncology  -outpt follow up 5. GVHD:  Per heme onc, immunosuppresants/antifungals 6. HTN: normodyne with control at present 7. CKD: regular BMET's, monitor I's and O's, weights. 8. Afib: rate controlled at present. Follow for  activity tolerance 9. Steroid induced DM: follow CBG's, SSI as needed 10. Pancytopenia: serial CBC's. Close monitoring of clinical status, no active infection signs at present. Counts appear relatively stable today  -mgt per oncology regs 11. GI: diarrhea, N/V,. Stools still generally loose but showing decreased frequency and some increase in form  -maintain fluids and electrolytes-  Encouraged supplements  -  scheduled immodium, probiotic, has been evaluated by GI during this hospitalization already for diarrhea  -antiemetics, compazine 12. Mood: zoloft   -continue IVF given BUN  -labs are generally stable  -stools have improved 12.  Anorexia added marinol, monitor intake  -intake showing signs of improvement 13.  Osteoporosis steroid induced,  L3 fracture new since Sept, pt comfortable when sedentary, LBP with movement.  No need for vertebroplasty.     Hydrocodone is helpful   LOS (Days) 14 A FACE TO FACE EVALUATION WAS PERFORMED  Kellee Sittner T 12/23/2012 9:02 AM

## 2012-12-23 NOTE — Progress Notes (Signed)
Timothy [name]   DOB:1946/08/12   ZO#:[id number]   JWJ#:[id number]  Subjective: no BMs overnight; had 4 yesterday; still not perfectly formed; he sits on [facility] briefly after meals and otherwise pays attention "to the little warning I get" and has been able to avoid "messes"; eating better; feels he is making good progress in rehab; no fever, no rash, no moth problems; wife Timothy [name] saw cardiologist re palpitations and has follow-up JAN 14; not in room   Objective: middle aged white male examined in bed Filed Vitals:   12/23/12 0500  BP: 144/81  Pulse: 65  Temp: 97.6 F (36.4 C)  Resp: 17    Body mass index is 30.73 kg/(m^2).  Intake/Output Summary (Last 24 hours) at 12/23/12 0727 Last data filed at 12/22/12 1818  Gross per 24 hour  Intake    360 ml  Output      0 ml  Net    360 ml     Sclerae unicteric, eyelids normal  Oropharynx clear: no thrush, sores or evidence of GVHD  No peripheral adenopathy  Lungs clear -- no rales or rhonchi  Heart regular rate and rhythm, no murmur appreciated  Abdomen soft, NT, +BS  MSK no focal spinal tenderness, normal joint flexibility, bilateral LE edema as before  Skin: normal turgor  Neuro nonfocal  Scheduled Meds:   . acyclovir  400 mg Oral BID  . antiseptic oral rinse  15 mL Mouth Rinse q12n4p  . budesonide  9 mg Oral Daily  . dronabinol  2.5 mg Oral BID AC  . enoxaparin (LOVENOX) injection  40 mg Subcutaneous Q24H  . feeding supplement (VITAL 1.5 CAL)  237 mL Oral TID WC & HS  . fluconazole  100 mg Oral q morning - 10a  . HYDROcodone-acetaminophen  1 tablet Oral Q breakfast  . labetalol  200 mg Oral BID  . loperamide  4 mg Oral BID WC  . miconazole   Topical BID  . pantoprazole  40 mg Oral Daily  . predniSONE  30 mg Oral Q breakfast  . prochlorperazine  5 mg Oral TID AC  . protein supplement  1 scoop Oral TID WC  . psyllium  1 packet Oral Daily  . saccharomyces boulardii  500 mg Oral BID  . sertraline  100 mg Oral QODAY  . sertraline   50 mg Oral QODAY  . sodium chloride  10-40 mL Intracatheter Q12H  . sulfamethoxazole-trimethoprim  1 tablet Oral 2 times weekly  . tacrolimus  1.5 mg Oral BID  . testosterone  1 patch Transdermal Daily   Continuous Infusions:   . sodium chloride 75 mL/hr at 12/22/12 1855   PRN Meds:.alum & mag hydroxide-simeth, diphenoxylate-atropine, guaiFENesin-dextromethorphan, HYDROcodone-acetaminophen, meclizine, ondansetron, prochlorperazine, sodium chloride    CBG (last 3)  No results found for this basename: GLUCAP:3 in the last 72 hours   Labs:  Lab Results  Component Value Date   WBC 5.7 12/20/2012   HGB 9.2* 12/20/2012   HCT 25.5* 12/20/2012   MCV 84.2 12/20/2012   PLT 111* 12/20/2012   NEUTROABS 1.7 12/20/2012   Results for Timothy [name] (MRN [id number]) as of 12/23/2012 07:35  Ref. Range 12/20/2012 05:00  Sodium Latest Range: 136-145 mEq/L 134 (L)  Potassium Latest Range: 3.5-5.1 mEq/L 3.7  Chloride Latest Range: 96-112 mEq/L 108  CO2 Latest Range: 19-32 mEq/L 19  BUN Latest Range: 7.0-26.0 mg/dL 27 (H)  Creatinine Latest Range: 0.50-1.35 mg/dL 8.46  Calcium Latest Range: 8.4-10.5 mg/dL 7.1 (L)  GFR calc  non Af Amer Latest Range: >90 mL/min 87 (L)  GFR calc Af Amer Latest Range: >90 mL/min >90  Glucose Latest Range: 70-99 mg/dl 93  Alkaline Phosphatase Latest Range: 39-117 U/L 89  Albumin Latest Range: 3.5-5.2 g/dL 1.2 (L)  AST Latest Range: 0-37 U/L 17  ALT Latest Range: 0-53 U/L 13  Total Protein Latest Range: 6.4-8.3 g/dL 3.0 (L)  Total Bilirubin Latest Range: 0.3-1.2 mg/dL 0.2 (L)  Prealbumin Latest Range: 17.0-34.0 mg/dL 16.1 (L)  WBC Latest Range: 4.0-10.5 K/uL 5.7  RBC Latest Range: 4.22-5.81 MIL/uL 3.03 (L)  Hemoglobin Latest Range: 13.0-17.0 g/dL 9.2 (L)  HCT Latest Range: 39.0-52.0 % 25.5 (L)  MCV Latest Range: 78.0-100.0 fL 84.2  MCH Latest Range: 26.0-34.0 pg 30.4  MCHC Latest Range: 30.0-36.0 g/dL 09.6 (H)  RDW Latest Range: 11.5-15.5 % 17.6 (H)  Platelets  Latest Range: 150-400 K/uL 111 (L)  Neutrophils Relative Latest Range: 43-77 % 29 (L)  Lymphocytes Relative Latest Range: 12-46 % 63 (H)  Monocytes Relative Latest Range: 3-12 % 8  Eosinophils Relative Latest Range: 0-5 % 0  Basophils Relative Latest Range: 0-1 % 0  NEUT# Latest Range: 1.7-7.7 K/uL 1.7  Lymphocytes Absolute Latest Range: 0.7-4.0 K/uL 3.6  Monocytes Absolute Latest Range: 0.1-1.0 K/uL 0.5  Eosinophils Absolute Latest Range: 0.0-0.5 10e3/uL 0.0  Basophils Absolute Latest Range: 0.0-0.1 K/uL 0.0    Results for Timothy [name] (MRN [id number]) as of 12/23/2012 07:35  Ref. Range 11/18/2012 16:36 11/26/2012 04:30 12/13/2012 16:25  Tacrolimus (FK506) - [facility] No range found 2.9 5.6 6.1 ng/mL    Urine Studies No results found for this basename: UACOL:2,UAPR:2,USPG:2,UPH:2,UTP:2,UGL:2,UKET:2,UBIL:2,UHGB:2,UNIT:2,UROB:2,ULEU:2,UEPI:2,UWBC:2,URBC:2,UBAC:2,CAST:2,CRYS:2,UCOM:2,BILUA:2 in the last 72 hours  Basic Metabolic Panel:  Lab 12/20/12 9147 12/18/12 0448  NA 134* 131*  K 3.7 3.8  CL 108 106  CO2 19 18*  GLUCOSE 93 84  BUN 27* 21  CREATININE 0.90 0.88  CALCIUM 7.1* 7.1*  MG -- --  PHOS -- --   GFR Estimated Creatinine Clearance: 86 ml/min (by C-G formula based on Cr of 0.9). Liver Function Tests:  Lab 12/20/12 0500  AST 17  ALT 13  ALKPHOS 89  BILITOT 0.2*  PROT 3.0*  ALBUMIN 1.2*   No results found for this basename: LIPASE:5,AMYLASE:5 in the last 168 hours No results found for this basename: AMMONIA:5 in the last 168 hours Coagulation profile No results found for this basename: INR:5,PROTIME:5 in the last 168 hours  CBC:  Lab 12/20/12 0500  WBC 5.7  NEUTROABS 1.7  HGB 9.2*  HCT 25.5*  MCV 84.2  PLT 111*   Cardiac Enzymes: No results found for this basename: CKTOTAL:5,CKMB:5,CKMBINDEX:5,TROPONINI:5 in the last 168 hours BNP: No components found with this basename: POCBNP:5 CBG: No results found for this basename: GLUCAP:5 in the last 168  hours D-Dimer No results found for this basename: DDIMER:2 in the last 72 hours Hgb A1c No results found for this basename: HGBA1C:2 in the last 72 hours Lipid Profile No results found for this basename: CHOL:2,HDL:2,LDLCALC:2,TRIG:2,CHOLHDL:2,LDLDIRECT:2 in the last 72 hours Thyroid function studies No results found for this basename: TSH,T4TOTAL,FREET3,T3FREE,THYROIDAB in the last 72 hours Anemia work up No results found for this basename: VITAMINB12:2,FOLATE:2,FERRITIN:2,TIBC:2,IRON:2,RETICCTPCT:2 in the last 72 hours Microbiology Recent Results (from the past 240 hour(s))  CLOSTRIDIUM DIFFICILE BY PCR     Status: Normal   Collection Time   12/18/12  8:31 PM      Component Value Range Status Comment   C difficile by pcr NEGATIVE  NEGATIVE Final  Studies:  No results found.  REHAB STATUS: 1. Functional deficits secondary to deconditioning related CLL recent bone marrow transplant and multiple medical conditions which require 3+ hours per day of interdisciplinary therapy in a comprehensive inpatient rehab setting.  Physiatrist is providing close team supervision and 24 hour management of active medical problems listed below.  Physiatrist and rehab team continue to assess barriers to discharge/monitor patient progress toward functional and medical goals.  FIM:  FIM - Bathing  Bathing Steps Patient Completed: Chest;Right Arm;Left Arm;Abdomen;Front perineal area;Buttocks;Right upper leg;Left upper leg  Bathing: 4: Min-Patient completes 8-9 62f 10 parts or 75+ percent  FIM - Upper Body Dressing/Undressing  Upper body dressing/undressing steps patient completed: Thread/unthread right sleeve of pullover shirt/dresss;Thread/unthread left sleeve of pullover shirt/dress;Put head through opening of pull over shirt/dress;Pull shirt over trunk  Upper body dressing/undressing: 5: Set-up assist to: Obtain clothing/put away  FIM - Lower Body Dressing/Undressing  Lower body  dressing/undressing steps patient completed: Thread/unthread right pants leg;Thread/unthread left pants leg;Pull pants up/down  Lower body dressing/undressing: 3: Mod-Patient completed 50-74% of tasks  FIM - Toileting  Toileting steps completed by patient: Adjust clothing prior to toileting;Performs perineal hygiene;Adjust clothing after toileting  Toileting Assistive Devices: Grab bar or rail for support  Toileting: 5: Supervision: Safety issues/verbal cues  FIM - Therapist, nutritional Devices: Grab bars;Walker  Toilet Transfers: 6-To toilet/ BSC  FIM - Biochemist, clinical Devices: TEFL teacher: 3: Sit > Supine: Mod A (lifting assist/Pt. 50-74%/lift 2 legs);5: Chair or W/C > Bed: Supervision (verbal cues/safety issues)  FIM - Locomotion: Wheelchair  Distance: 250  Locomotion: Wheelchair: 5: Travels 150 ft or more: maneuvers on rugs and over door sills with supervision, cueing or coaxing  FIM - Locomotion: Ambulation  Locomotion: Ambulation Assistive Devices: Designer, industrial/product  Ambulation/Gait Assistance: 5: Supervision  Locomotion: Ambulation: 5: Travels 150 ft or more with supervision/safety issues  Comprehension  Comprehension Mode: Auditory  Comprehension: 5-Follows basic conversation/direction: With no assist  Expression  Expression Mode: Verbal  Expression: 5-Expresses basic needs/ideas: With no assist  Social Interaction  Social Interaction: 5-Interacts appropriately 90% of the time - Needs monitoring or encouragement for participation or interaction.  Problem Solving  Problem Solving: 4-Solves basic 75 - 89% of the time/requires cueing 10 - 24% of the time  Memory  Memory: 4-Recognizes or recalls 75 - 89% of the time/requires cueing 10 - 24% of the time  Assessment: 67 y.o. [place] man with a history of well-differentiated lymphocytic lymphoma/ chronic lymphoid leukemia initially diagnosed in 2000, not  requiring intervention until 2006; with multiple chromosomal abnormalities; s/p allogeneic transplant March 2013 at "the [facility]", now admitted with GVHD. His treatment history is as follows:  (1) fludarabine/cyclophosphamide/rituximab x5 completed May 2007.  (2) rituximab for 8 doses October 2010, with partial response  (3) Leustatin and ofatumumab weekly x8 July to September 2011 followed by maintenance ofatumumab maintenance ofatumumab every 2 months, with initial response but rising counts September 2012  (4) status-post unrelated donor stem-cell transplant 02/24/2012 at the [facility] in [place]  (a) conditioning regimen consisted of fludarabine + TBI at 200 cGy, followed by rituximab x27;  (b) CMV reactivation x3 (patient CMV positive, donor negative), s/p ganciclovir treatment; 3d reactivation August 2013, s/p gancyclovir, with negative PCR mid-September 2013; last gancyclovir dose 10/06/2012  (c) GVHD: involving gut and skin, treated with steroids, tacrolimus and MMF initially, MMF eventually d/c'd and tacrolimus tapered to current dose (d) atrial fibrillation: resolved  on brief amiodarone regimen  (e) steroid-induced myopathy  (f) hypomagnesemia: resolved with discontinuation of gancyclovir  (g) hypogammaglobulinemia: s/p IVIG most recently 12/03/2012  (h) elevated triglycerides (606 on 07/14/2012)  (i) adrenal insufficiency: on prednisone and budesonide  (j) pancytopenia; off growth factors currently  (5) restaging studies September 2013 including CT scans, flow cytometry, and bone marrow biopsy, showed no evidence of residual chronic lymphoid leukemia.  (6) recurrent GVHD (skin rash, mouth changes, severe diarrhea and gastric/duodenal/colonic biopsies 11/17/2012 c/w GVHD grade 2) now only remaining sign is mild but bothersome diarrhea; c diff negative x3; continuing current regimen (7) severe malnutrition -- on vital in addition to regular diet (8) testosterone deficiency--on patch (9)  deconditioning: ongoing REHAB  (10) mild dehydration: encouraged increased po fluids; may need IVF support intermittently  (11) steroid-induced osteoporosis with compression fractures: received pamidronate 12/18/2012 (12) nausea: improved on current meds  Plan: benefitting from Rehab; target discharge date according to pt is Jan 8; he would like to return to the [facility] Jan 20 (instead of March 3d as scheduled)-- I will discuss with their staff. Otherwise no changes made. Will write for Monday labs  If there are additional issues you may contact the [facility] in [place] directly at [phone]     Lowella Dell MD 12/23/2012

## 2012-12-23 NOTE — Progress Notes (Signed)
Occupational Therapy Session Note  Patient Details  Name: Timothy [name] MRN: [id number] Date of Birth: [birth year]  Today's Date: 12/23/2012 Time: 0930-1000 Time Calculation (min): 30 min   Skilled Therapeutic Interventions/Progress Updates:    Pt in recliner upon therapy arrival. Agreeable to participate in tx session. Patient walked from room to gym and back with walker at Supervision level. Patient participated in UB strengthening exercises to increase overall strength and endurance. See exercises below. Pt took several rest breaks when needed.   Therapy Documentation Precautions:  Precautions Precautions: Fall Precaution Comments: AFIB, chronic leukemia, immuno compromised, graft vs. host disease; LE edema, ace wrap and elevate LE  Restrictions Weight Bearing Restrictions: No Pain: Pain Assessment Pain Assessment: No/denies pain Exercises: General Exercises - Upper Extremity Shoulder Flexion: Strengthening;Both;20 reps;Seated;Bar weights/barbell Bar Weights/Barbell (Shoulder Flexion): 4 lbs Shoulder Horizontal ABduction: Strengthening;Both;20 reps;Seated;Bar weights/barbell Bar Weights/Barbell (Shoulder Horizontal Abduction): 4 lbs Shoulder Horizontal ADduction: Strengthening;Both;20 reps;Seated;Bar weights/barbell Bar Weights/Barbell (Shoulder Horizontal Adduction): 4 lbs Elbow Flexion: Strengthening;Both;20 reps;Seated;Bar weights/barbell Bar Weights/Barbell (Elbow Flexion): 4 lbs Elbow Extension: Strengthening;Both;20 reps;Seated;Bar weights/barbell Bar Weights/Barbell (Elbow Extension): 4 lbs Other Exercises Other Exercises: 4lb weighted bar; circles; forward/backwards; 20 reps; 1 set; Bil UE  See FIM for current functional status  Therapy/Group: Individual Therapy  Limmie Patricia, OTR/L 12/23/2012, 11:14 AM

## 2012-12-23 NOTE — Progress Notes (Signed)
Physical Therapy Session Note  Patient Details  Name: Timothy [name] MRN: [id number] Date of Birth: [birth year]  Today's Date: 12/23/2012 Time: 1400-1455 Time Calculation (min): 55 min  Short Term Goals: Week 2:  PT Short Term Goal 1 (Week 2): = LTGs  Skilled Therapeutic Interventions/Progress Updates:    Session focused on activities for endurance and strengthening to prepare for home environment including gait, transfers, and stair training. Pt with gait on unit with S; introduced rollator at end of session as pt reports that what he is using at home. Discussed with pt and wife recommend to use it the next few days to make sure he is comfortable with it, as he has been using RW entire time on rehab and both agreed. Stair training with 1 rail on L (when ascending), min to heavy min A ascending stairs, S descending stairs. Discussed safe positioning of wife when assisting pt, but she did not return demonstrate yet as pt stated he was too fatigued and would rather wait til they both were fresh to practice. Also discussed home set up (plan to have hospital bed downstairs) and discussed the amount of energy it would take pt to go up the 14 steps for a shower, and will need to judge safety of that based on energy - both verbalized understanding. Returned to bed end of session to rest in R sidelying.   Therapy Documentation Precautions:  Precautions Precautions: Fall Precaution Comments: AFIB, chronic leukemia, immuno compromised, graft vs. host disease; LE edema, ace wrap and elevate LE  Restrictions Weight Bearing Restrictions: No  Pain:  denies pain.  See FIM for current functional status  Therapy/Group: Individual Therapy  [name] [facility] 12/23/2012, 4:28 PM

## 2012-12-24 ENCOUNTER — Inpatient Hospital Stay (HOSPITAL_COMMUNITY): Payer: BC Managed Care – PPO

## 2012-12-24 ENCOUNTER — Inpatient Hospital Stay (HOSPITAL_COMMUNITY): Payer: Self-pay | Admitting: Physical Therapy

## 2012-12-24 ENCOUNTER — Inpatient Hospital Stay (HOSPITAL_COMMUNITY): Payer: BC Managed Care – PPO | Admitting: Physical Therapy

## 2012-12-24 DIAGNOSIS — R5381 Other malaise: Secondary | ICD-10-CM

## 2012-12-24 NOTE — Progress Notes (Addendum)
Physical Therapy Note  Patient Details  Name: Timothy [name] MRN: [id number] Date of Birth: 06/26/1946 Today's Date: 12/24/2012  8: 15 - 8:35 20 minutes Individual session Patient denies pain.  Patient in bed eating breakfast upon entering room. Therapist allowed patient to finish. Wrapped LE's with ace wraps. Patient reported that he needed to urinate. Patient supine to sit with bed rails, head of bed raised and supervision. Patient ambulated to bathroom with rollator and supervision. Patient had been incontinent of bowels and needed assistance to remove adult brief. Patient able to adjust pants up and down as well as peri hygiene. Patient ambulated back to bed with rollator. Sit to supine with supervision and bed rails.   [name] M 12/24/2012, 9:31 AM

## 2012-12-24 NOTE — Progress Notes (Addendum)
Physical Therapy Session Note  Patient Details  Name: Timothy [name] MRN: [id number] Date of Birth: 29-Oct-1946  Today's Date: 12/24/2012 Time: 1118-1200 Time Calculation (min): 42 min  Short Term Goals: Week 2:  PT Short Term Goal 1 (Week 2): = LTGs  Skilled Therapeutic Interventions/Progress Updates:    Bed mobility supervision (slow speed to get bil legs over EOB) using bed rails and HOB elevated 20 degrees.  Gait with rollator to simulate what he will be using at home 150' x 1 with supervision.  Cues for safe use of rollator (locking and unlocking breaks).  Stairs up and down 2 6" steps x 2 min assist with left rail sideways.  Seated rest break needed between sets of steps.  Longer distance gait to work on endurance and leg strength x 300' with rollator over both tiled and carpeted surfaces.  Car transfer with min assist to get right leg into car and to get to standing from car due to fatigue from session already.  WC mobility x 80' bil arms mod I.  Seated exercises: heel/toe raises, LAQs with 5 sec holds, marches, hip adduction with pillow.  Sit to supine transition mod assist of bil legs into the bed, pt using bed rail. supine: ankle pumps, quad sets, heel slides all bil x 10.    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 Stanley Helmuth, PT, DPT [phone]   12/24/2012, 12:25 PM

## 2012-12-24 NOTE — Progress Notes (Signed)
NUTRITION FOLLOW UP  Intervention:   Continue 1 can Vital 1.5 with each meal and at HS to maximize oral intake.  Nutrition Dx:   Altered GI function related to multiple causes as evidenced by N/V/D, ongoing.  Goal:   Tolerate regular diet with intake of meals and supplements to prevent further weight loss, progressing.  Monitor:   PO intake, diet tolerance, labs, weight trend  Assessment:   Physical deconditioning, chronic lymphoid leukemia s/p allogenic transplant and chemo.  Pt with an increase in appetite today.  Ate 100% of breakfast this morning.  Is receiving Marinol to increase appetite.  On previous days has been consuming 20-75% of meals, mostly <50%.  Stools have improved somewhat but remain loose.  Intake remains inadequate requiring nocturnal IVF.  Height: Ht Readings from Last 1 Encounters:  12/09/12 5\' 7"  (1.702 m)    Weight Status:   Wt Readings from Last 1 Encounters:  12/24/12 202 lb 2.6 oz (91.7 kg)  Weight up from 187 lb on 12/27 due to edema/fluid overload.  Re-estimated needs:  Kcal: 2200-2300 Protein: 100-115 gm Fluid: 2.2-2.3 L daily  Skin: skin tear on left hand; clean and dry  Diet Order: General   Intake/Output Summary (Last 24 hours) at 12/24/12 1407 Last data filed at 12/24/12 0800  Gross per 24 hour  Intake    600 ml  Output      0 ml  Net    600 ml    Last BM: daily   Labs:   Lab 12/20/12 0500 12/18/12 0448  NA 134* 131*  K 3.7 3.8  CL 108 106  CO2 19 18*  BUN 27* 21  CREATININE 0.90 0.88  CALCIUM 7.1* 7.1*  MG -- --  PHOS -- --  GLUCOSE 93 84    CBG (last 3)  No results found for this basename: GLUCAP:3 in the last 72 hours  Scheduled Meds:    . acyclovir  400 mg Oral BID  . antiseptic oral rinse  15 mL Mouth Rinse q12n4p  . budesonide  9 mg Oral Daily  . dronabinol  2.5 mg Oral BID AC  . enoxaparin (LOVENOX) injection  40 mg Subcutaneous Q24H  . feeding supplement (VITAL 1.5 CAL)  237 mL Oral TID WC & HS  .  fluconazole  100 mg Oral q morning - 10a  . HYDROcodone-acetaminophen  1 tablet Oral Q breakfast  . labetalol  200 mg Oral BID  . loperamide  4 mg Oral BID WC  . miconazole   Topical BID  . pantoprazole  40 mg Oral Daily  . predniSONE  30 mg Oral Q breakfast  . prochlorperazine  5 mg Oral TID AC  . protein supplement  1 scoop Oral TID WC  . psyllium  1 packet Oral Daily  . saccharomyces boulardii  500 mg Oral BID  . sertraline  100 mg Oral QODAY  . sertraline  50 mg Oral QODAY  . sodium chloride  10-40 mL Intracatheter Q12H  . sulfamethoxazole-trimethoprim  1 tablet Oral 2 times weekly  . tacrolimus  1.5 mg Oral BID  . testosterone  1 patch Transdermal Daily    Continuous Infusions:    . sodium chloride 75 mL/hr at 12/23/12 1825    Joaquin Courts, RD, LDN, CNSC Pager# [phone] After Hours Pager# [phone]

## 2012-12-24 NOTE — Progress Notes (Signed)
Patient ID: Timothy [name], male   DOB: 1946/08/20, 68 y.o.   MRN: [id number] Subjective/Complaints: Feeling stronger. Appetite continues to improve. Had a good day with therapy yesterday.  Review of Systems   All other systems reviewed and are negative.    Objective: Vital Signs: Blood pressure 134/69, pulse 78, temperature 98 F (36.7 C), temperature source Oral, resp. rate 18, height 5\' 7"  (1.702 m), weight 91.7 kg (202 lb 2.6 oz), SpO2 96.00%. No results found. No results found for this basename: WBC:2,HGB:2,HCT:2,PLT:2 in the last 72 hours No results found for this basename: NA:2,K:2,CL:2,CO:2,GLUCOSE:2,BUN:2,CREATININE:2,CALCIUM:2 in the last 72 hours CBG (last 3)  No results found for this basename: GLUCAP:3 in the last 72 hours  Wt Readings from Last 3 Encounters:  12/24/12 91.7 kg (202 lb 2.6 oz)  12/05/12 84 kg (185 lb 3 oz)  12/03/12 86.3 kg (190 lb 4.1 oz)    Physical Exam:  Constitutional: He is oriented to person, place, and time. He appears well-developed. pale HENT:  Head: Normocephalic and atraumatic.  Right Ear: External ear normal.  Left Ear: External ear normal.  Nose: Nose normal.  Mouth/Throat: Oropharynx is clear and moist.  Eyes: Conjunctivae normal and EOM are normal. Pupils are equal, round, and reactive to light.  Neck: Normal range of motion. Neck supple.  Cardiovascular: Normal rate, regular rhythm, normal heart sounds and intact distal pulses.  Respiratory: Effort normal and breath sounds normal. No respiratory distress. He has no wheezes. He has no rales. He exhibits no tenderness.  GI: Soft. Bowel sounds are scarce. He exhibits no distension. There is tenderness. There is no rebound and no guarding.  Genitourinary: Penis normal.  Musculoskeletal: Normal range of motion.  1+ edema bilateral pretibial 1++ bilateral feet , left more than right Neurological: He is alert and oriented to person, place, and time. He has normal reflexes. He displays no  tremor. He exhibits normal muscle tone. Gait abnormal.  Motor strength is 4/5 in bilateral deltoid, biceps, triceps, grip 4 minus/5 in bilateral hip flexors knee extensors 4/5 bilateral ankle dorsiflexor plantar flexor some pain in addition with proximal leg movements Skin: Skin is warm and dry. Color better. Psychiatric: He has a normal mood and affect. His behavior is normal. Judgment and thought content normal. More up beat as a whole   Assessment/Plan: 1. Functional deficits secondary to deconditioning related CLL recent bone marrow transplant and multiple medical conditions which require 3+ hours per day of interdisciplinary therapy in a comprehensive inpatient rehab setting. Physiatrist is providing close team supervision and 24 hour management of active medical problems listed below. Physiatrist and rehab team continue to assess barriers to discharge/monitor patient progress toward functional and medical goals. FIM: FIM - Bathing Bathing Steps Patient Completed: Chest;Right Arm;Left Arm;Abdomen;Front perineal area;Buttocks;Right upper leg;Left upper leg Bathing: 0: Activity did not occur  FIM - Upper Body Dressing/Undressing Upper body dressing/undressing steps patient completed: Thread/unthread left sleeve of pullover shirt/dress;Thread/unthread right sleeve of pullover shirt/dresss;Put head through opening of pull over shirt/dress;Pull shirt over trunk Upper body dressing/undressing: 5: Set-up assist to: Obtain clothing/put away FIM - Lower Body Dressing/Undressing Lower body dressing/undressing steps patient completed: Thread/unthread right pants leg;Thread/unthread left pants leg;Pull pants up/down Lower body dressing/undressing: 3: Mod-Patient completed 50-74% of tasks  FIM - Toileting Toileting steps completed by patient: Adjust clothing prior to toileting;Performs perineal hygiene;Adjust clothing after toileting Toileting Assistive Devices: Grab bar or rail for  support Toileting: 5: Supervision: Safety issues/verbal cues  FIM - Diplomatic Services operational officer  Devices: Elevated toilet seat;Walker Toilet Transfers: 5-To toilet/BSC: Supervision (verbal cues/safety issues);5-From toilet/BSC: Supervision (verbal cues/safety issues)  FIM - Banker Devices: Therapist, occupational: 5: Supine > Sit: Supervision (verbal cues/safety issues);5: Bed > Chair or W/C: Supervision (verbal cues/safety issues);5: Chair or W/C > Bed: Supervision (verbal cues/safety issues);4: Sit > Supine: Min A (steadying pt. > 75%/lift 1 leg)  FIM - Locomotion: Wheelchair Distance: 250 Locomotion: Wheelchair: 0: Activity did not occur (gait to/from therapies) FIM - Locomotion: Ambulation Locomotion: Ambulation Assistive Devices: Designer, industrial/product Ambulation/Gait Assistance: 5: Supervision Locomotion: Ambulation: 5: Travels 150 ft or more with supervision/safety issues  Comprehension Comprehension Mode: Auditory Comprehension: 5-Follows basic conversation/direction: With no assist  Expression Expression Mode: Verbal Expression: 5-Expresses basic needs/ideas: With no assist  Social Interaction Social Interaction: 5-Interacts appropriately 90% of the time - Needs monitoring or encouragement for participation or interaction.  Problem Solving Problem Solving: 4-Solves basic 75 - 89% of the time/requires cueing 10 - 24% of the time  Memory Memory: 4-Recognizes or recalls 75 - 89% of the time/requires cueing 10 - 24% of the time Medical Problem List and Plan:  1. DVT Prophylaxis/Anticoagulation: Pharmaceutical: Lovenox  2. Pain Management: Acetaminophen  3. Mood: Zoloft ego support 4. CLL/bone marrow transplant/: mgt per oncology  -outpt follow up 5. GVHD:  Per heme onc, immunosuppresants/antifungals 6. HTN: normodyne with control at present 7. CKD: regular BMET's, monitor I's and O's, weights. 8. Afib: rate  controlled at present. Follow for activity tolerance 9. Steroid induced DM: follow CBG's, SSI as needed 10. Pancytopenia: serial CBC's. Close monitoring of clinical status, no active infection signs at present. Counts appear relatively stable today  -mgt per oncology regs 11. GI: diarrhea, N/V,. Stools still generally loose but showing decreased frequency and some increase in form  -maintain fluids and electrolytes-  Encouraged supplements  -  scheduled immodium, probiotic, has been evaluated by GI during this hospitalization already for diarrhea  -antiemetics, compazine 12. Mood: zoloft   -continue IVF given BUN  -labs are generally stable  -stools have improved somewhat but remain loose 12.  Anorexia added marinol, monitor intake  -intake showing signs of improvement (ate 100% of breakfast) 13.  Osteoporosis steroid induced,  L3 fracture new since Sept, pt comfortable when sedentary, LBP with movement.  No need for vertebroplasty.     Hydrocodone is helpful   LOS (Days) 15 A FACE TO FACE EVALUATION WAS PERFORMED  SWARTZ,ZACHARY T 12/24/2012 9:20 AM

## 2012-12-24 NOTE — Progress Notes (Signed)
Occupational Therapy Session Note  Patient Details  Name: Timothy [name] MRN: [id number] Date of Birth: [birth year]  Today's Date: 12/24/2012 Time: 10:18-11:19 Time Calculation (min): 42 min   Skilled Therapeutic Interventions/Progress Updates:    Performed bathing and dressing session this am.  Pt needed min assist for sit to stand from EOB, wheelchair, and bedside toilet.  Pt able to perform bathing on shower seat with min assist.  Did not attempt washing his lower legs or feet but did utilize the reacher to dry them off with a towel.  With increased time he was able to cross his LEs over the opposite knee for donning his gripper socks to walk out of the bathroom.  Used reacher to assist with donning second LE in undergarment protector and pants leg.  Therapist performed ace wrapping to the LEs while sitting in the wheelchair..  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

## 2012-12-24 NOTE — Progress Notes (Signed)
Social Work Patient ID: Timothy [name], male   DOB: May 09, 1946, 67 y.o.   MRN: [id number]   Received confirmation from [facility]) yesterday of pt's coverage through 12/28/12 with d/c on 12/29/12.  Auth # [id number].  Pt and wife informed.  Sham Alviar, LCSW

## 2012-12-24 NOTE — Progress Notes (Signed)
Physical Therapy Session Note  Patient Details  Name: Timothy [name] MRN: [id number] Date of Birth: 03/19/1946  Today's Date: 12/24/2012 Time: 1300-1355 Time Calculation (min): 55 min  Short Term Goals: Week 2:  PT Short Term Goal 1 (Week 2): = LTGs    Skilled Therapeutic Interventions/Progress Updates:  Patient discussed personal goals of being able to ascend 8" step to enter home and to be able to elevate bilateral LE's into bed independently.  Session focused on LE strengthening, with emphasis on hip flexors.  Min A for bed mobility to assist with right LE supine/sit using bedrails with head of bed elevated.  Ambulation with rollator walker >150' with supervision and cuing to stay closer to walker.  Patient demonstrated safe technique in locking hand brakes before transferring. NuStep at level 3 for two bouts of 6 minutes each with 3 minute rest between bouts.  Seated hip flexion bilaterally 10 reps x 2 sets with rest break between sets.  Patient demonstrated ability to elevate knees 2" throughout exercise while in sitting position.  Marching in place at parallel bars 10 reps bilaterally x 2 sets with seated rest break between sets.  Patient able to elevate knees 6-7" inconsistently during therex.  Patient evidently fatigued quickly during therex with quick recovery on right LE with brief rest.  Able to elevate right foot 7" x 4 trials; unable to elevate left foot more than 6" from floor.  Ambulation with rollator back to room, mod A to elevate bilateral LEs into bed.  Rewrapped ace wraps, elevated legs on pillows, patient comfortable with bed alarm on.    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

## 2012-12-25 ENCOUNTER — Inpatient Hospital Stay (HOSPITAL_COMMUNITY): Payer: BC Managed Care – PPO | Admitting: Physical Therapy

## 2012-12-25 DIAGNOSIS — C9110 Chronic lymphocytic leukemia of B-cell type not having achieved remission: Secondary | ICD-10-CM

## 2012-12-25 MED ORDER — ACYCLOVIR 400 MG PO TABS
400.0000 mg | ORAL_TABLET | Freq: Two times a day (BID) | ORAL | Status: DC
  Administered 2012-12-25 – 2012-12-29 (×8): 400 mg via ORAL
  Filled 2012-12-25 (×9): qty 1

## 2012-12-25 NOTE — Progress Notes (Signed)
Patient ID: Timothy [name], male   DOB: 1946/10/26, 67 y.o.   MRN: [id number] Subjective/Complaints:  No new complaints  Feeling stronger. Appetite continues to improve. Had a good day with therapy yesterday.  Review of Systems   All other systems reviewed and are negative.    Objective: Vital Signs: Blood pressure 136/86, pulse 73, temperature 97.5 F (36.4 C), temperature source Oral, resp. rate 19, height 5\' 7"  (1.702 m), weight 197 lb 15.6 oz (89.8 kg), SpO2 96.00%. No results found. No results found for this basename: WBC:2,HGB:2,HCT:2,PLT:2 in the last 72 hours No results found for this basename: NA:2,K:2,CL:2,CO:2,GLUCOSE:2,BUN:2,CREATININE:2,CALCIUM:2 in the last 72 hours CBG (last 3)  No results found for this basename: GLUCAP:3 in the last 72 hours  Wt Readings from Last 3 Encounters:  12/25/12 197 lb 15.6 oz (89.8 kg)  12/05/12 185 lb 3 oz (84 kg)  12/03/12 190 lb 4.1 oz (86.3 kg)    Physical Exam:  Constitutional: He is oriented to person, place, and time. He appears well-developed. Pale. NAD HENT: moist Head: Normocephalic and atraumatic.  Right Ear: External ear normal.  Left Ear: External ear normal.  Nose: Nose normal.  Mouth/Throat: Oropharynx is clear and moist.  Eyes: Conjunctivae normal and EOM are normal. Pupils are equal, round, and reactive to light.  Neck: Normal range of motion. Neck supple.  Cardiovascular: Normal rate, regular rhythm, normal heart sounds and intact distal pulses.  Respiratory: Effort normal and breath sounds normal. No respiratory distress. He has no wheezes. He has no rales. He exhibits no tenderness.  GI: Soft. Bowel sounds are scarce. He exhibits no distension. There is tenderness. There is no rebound and no guarding.  Genitourinary: Penis normal.  Musculoskeletal: Normal range of motion.  1+ edema bilateral pretibial 1++ bilateral feet , left more than right Neurological: He is alert and oriented to person, place, and time. He  has normal reflexes. He displays no tremor. He exhibits normal muscle tone. Gait abnormal.  Motor strength is 4/5 in bilateral deltoid, biceps, triceps, grip 4 minus/5 in bilateral hip flexors knee extensors 4/5 bilateral ankle dorsiflexor plantar flexor some pain in addition with proximal leg movements Skin: Skin is warm and dry. Color better. Psychiatric: He has a normal mood and affect. His behavior is normal. Judgment and thought content normal. More up beat as a whole   Assessment/Plan: 1. Functional deficits secondary to deconditioning related CLL recent bone marrow transplant and multiple medical conditions which require 3+ hours per day of interdisciplinary therapy in a comprehensive inpatient rehab setting. Physiatrist is providing close team supervision and 24 hour management of active medical problems listed below. Physiatrist and rehab team continue to assess barriers to discharge/monitor patient progress toward functional and medical goals. FIM: FIM - Bathing Bathing Steps Patient Completed: Chest;Right Arm;Left Arm;Abdomen;Buttocks;Front perineal area;Right upper leg;Left upper leg Bathing: 4: Min-Patient completes 8-9 13f 10 parts or 75+ percent  FIM - Upper Body Dressing/Undressing Upper body dressing/undressing steps patient completed: Thread/unthread right sleeve of pullover shirt/dresss;Thread/unthread left sleeve of pullover shirt/dress;Put head through opening of pull over shirt/dress;Pull shirt over trunk Upper body dressing/undressing: 5: Set-up assist to: Obtain clothing/put away FIM - Lower Body Dressing/Undressing Lower body dressing/undressing steps patient completed: Thread/unthread right underwear leg;Thread/unthread left underwear leg;Thread/unthread right pants leg;Thread/unthread left pants leg;Don/Doff right sock;Pull underwear up/down;Don/Doff left sock;Pull pants up/down Lower body dressing/undressing: 4: Steadying Assist  FIM - Toileting Toileting steps  completed by patient: Adjust clothing prior to toileting;Performs perineal hygiene;Adjust clothing after toileting Toileting Assistive Devices:  Grab bar or rail for support Toileting: 4: Steadying assist  FIM - Diplomatic Services operational officer Devices: Bedside commode;Grab bars Toilet Transfers: 4-To toilet/BSC: Min A (steadying Pt. > 75%);4-From toilet/BSC: Min A (steadying Pt. > 75%)  FIM - Banker Devices: Walker;Arm rests;Bed rails;HOB elevated Bed/Chair Transfer: 5: Supine > Sit: Supervision (verbal cues/safety issues);4: Bed > Chair or W/C: Min A (steadying Pt. > 75%)  FIM - Locomotion: Wheelchair Distance: 100 Locomotion: Wheelchair: 5: Travels 50 - 149 ft, turns around, maneuvers to table, bed or toilet, negotiates 3% grade: modified independent FIM - Locomotion: Ambulation Locomotion: Ambulation Assistive Devices: Designer, industrial/product Ambulation/Gait Assistance: 5: Supervision Locomotion: Ambulation: 5: Travels 150 ft or more with supervision/safety issues  Comprehension Comprehension Mode: Auditory Comprehension: 5-Understands complex 90% of the time/Cues < 10% of the time  Expression Expression Mode: Verbal Expression: 5-Expresses basic needs/ideas: With no assist  Social Interaction Social Interaction: 5-Interacts appropriately 90% of the time - Needs monitoring or encouragement for participation or interaction.  Problem Solving Problem Solving: 4-Solves basic 75 - 89% of the time/requires cueing 10 - 24% of the time  Memory Memory: 4-Recognizes or recalls 75 - 89% of the time/requires cueing 10 - 24% of the time Medical Problem List and Plan:  1. DVT Prophylaxis/Anticoagulation: Pharmaceutical: Lovenox  2. Pain Management: Acetaminophen  3. Mood: Zoloft ego support - helping 4. CLL/bone marrow transplant/: mgt per oncology  -outpt follow up 5. GVHD:  Per heme onc, immunosuppresants/antifungals 6. HTN: normodyne  with control at present 7. CKD: regular BMET's, monitor I's and O's, weights. 8. Afib: rate controlled at present. Follow for activity tolerance. Cont Rx 9. Steroid induced DM: follow CBG's, SSI as needed 10. Pancytopenia: serial CBC's. Close monitoring of clinical status, no active infection signs at present. Counts appear relatively stable today  -mgt per oncology regs 11. GI: diarrhea, N/V,. Stools still generally loose but showing decreased frequency and some increase in form  -maintain fluids and electrolytes-  Encouraged supplements  -  scheduled immodium, probiotic, has been evaluated by GI during this hospitalization already for diarrhea  -antiemetics, compazine 12. Mood: zoloft   -continue IVF given BUN  -labs are generally stable  -stools have improved somewhat but remain loose 12.  Anorexia added marinol, monitor intake  -intake showing signs of improvement (ate 100% of breakfast) 13.  Osteoporosis steroid induced,  L3 fracture new since Sept, pt comfortable when sedentary, LBP with movement.  No need for vertebroplasty.     Hydrocodone is helpful   LOS (Days) 16 A FACE TO FACE EVALUATION WAS PERFORMED  Sonda Primes 12/25/2012 9:49 AM

## 2012-12-25 NOTE — Progress Notes (Signed)
Physical Therapy Note  Patient Details  Name: Timothy [name] MRN: [id number] Date of Birth: 17-Aug-1946 Today's Date: 12/25/2012  1300-1355 (55 minutes) group Pain: no reported pain Pt participated in PT group session focused on gait training, safety, endurance. Pt ambulates with rollator walker 240 feet X 1 SBA; up /down 2 (4 inch steps ) X 2; 6 inch steps X 2 with one rail min assist on 6 inch steps (with difficulty).   Alicen Donalson,JIM 12/25/2012, 7:41 AM

## 2012-12-26 ENCOUNTER — Inpatient Hospital Stay (HOSPITAL_COMMUNITY): Payer: BC Managed Care – PPO | Admitting: *Deleted

## 2012-12-26 NOTE — Progress Notes (Signed)
Patient ID: Timothy [name], male   DOB: 26-May-1946, 67 y.o.   MRN: [id number] Subjective/Complaints:  No new complaints. He  Had a BM  Feeling stronger. Appetite continues to improve. Had a good day with therapy yesterday.  Review of Systems   All other systems reviewed and are negative.    Objective: Vital Signs: Blood pressure 112/68, pulse 70, temperature 98.5 F (36.9 C), temperature source Oral, resp. rate 17, height 5\' 7"  (1.702 m), weight 194 lb 14.2 oz (88.4 kg), SpO2 94.00%. No results found. No results found for this basename: WBC:2,HGB:2,HCT:2,PLT:2 in the last 72 hours No results found for this basename: NA:2,K:2,CL:2,CO:2,GLUCOSE:2,BUN:2,CREATININE:2,CALCIUM:2 in the last 72 hours CBG (last 3)  No results found for this basename: GLUCAP:3 in the last 72 hours  Wt Readings from Last 3 Encounters:  12/26/12 194 lb 14.2 oz (88.4 kg)  12/05/12 185 lb 3 oz (84 kg)  12/03/12 190 lb 4.1 oz (86.3 kg)    Physical Exam:  Constitutional: He is oriented to person, place, and time. He appears well-developed. Pale. NAD HENT: moist Head: Normocephalic and atraumatic.  Right Ear: External ear normal.  Left Ear: External ear normal.  Nose: Nose normal.  Mouth/Throat: Oropharynx is clear and moist.  Eyes: Conjunctivae normal and EOM are normal. Pupils are equal, round, and reactive to light.  Neck: Normal range of motion. Neck supple.  Cardiovascular: Normal rate, regular rhythm, normal heart sounds and intact distal pulses.  Respiratory: Effort normal and breath sounds normal. No respiratory distress. He has no wheezes. He has no rales. He exhibits no tenderness.  GI: Soft. Bowel sounds are scarce. He exhibits no distension. There is tenderness. There is no rebound and no guarding.  Genitourinary: Penis normal.  Musculoskeletal: Normal range of motion.  1+ edema bilateral pretibial 1++ bilateral feet , left more than right Neurological: He is alert and oriented to person, place,  and time. He has normal reflexes. He displays no tremor. He exhibits normal muscle tone. Gait abnormal.  Motor strength is 4/5 in bilateral deltoid, biceps, triceps, grip 4 minus/5 in bilateral hip flexors knee extensors 4/5 bilateral ankle dorsiflexor plantar flexor some pain in addition with proximal leg movements Skin: Skin is warm and dry. Color better. Psychiatric: He has a normal mood and affect. His behavior is normal. Judgment and thought content normal. More up beat as a whole   Assessment/Plan: 1. Functional deficits secondary to deconditioning related CLL recent bone marrow transplant and multiple medical conditions which require 3+ hours per day of interdisciplinary therapy in a comprehensive inpatient rehab setting. Physiatrist is providing close team supervision and 24 hour management of active medical problems listed below. Physiatrist and rehab team continue to assess barriers to discharge/monitor patient progress toward functional and medical goals. FIM: FIM - Bathing Bathing Steps Patient Completed: Chest;Right Arm;Left Arm;Abdomen;Buttocks;Front perineal area;Right upper leg;Left upper leg Bathing:  (refused bath)  FIM - Upper Body Dressing/Undressing Upper body dressing/undressing steps patient completed: Thread/unthread right sleeve of pullover shirt/dresss;Thread/unthread left sleeve of pullover shirt/dress;Put head through opening of pull over shirt/dress;Pull shirt over trunk Upper body dressing/undressing: 5: Set-up assist to: Obtain clothing/put away FIM - Lower Body Dressing/Undressing Lower body dressing/undressing steps patient completed: Thread/unthread right underwear leg;Thread/unthread left underwear leg;Thread/unthread right pants leg;Thread/unthread left pants leg;Don/Doff right sock;Pull underwear up/down;Don/Doff left sock;Pull pants up/down Lower body dressing/undressing: 4: Steadying Assist  FIM - Toileting Toileting steps completed by patient: Adjust  clothing prior to toileting;Performs perineal hygiene;Adjust clothing after toileting Toileting Assistive Devices: Grab bar  or rail for support Toileting: 4: Steadying assist  FIM - Diplomatic Services operational officer Devices: Grab bars Toilet Transfers: 6-To toilet/ BSC;5-From toilet/BSC: Supervision (verbal cues/safety issues)  FIM - Banker Devices: Environmental consultant;Arm rests;Bed rails;HOB elevated Bed/Chair Transfer: 5: Sit > Supine: Supervision (verbal cues/safety issues);5: Supine > Sit: Supervision (verbal cues/safety issues)  FIM - Locomotion: Wheelchair Distance: 100 Locomotion: Wheelchair: 5: Travels 50 - 149 ft, turns around, maneuvers to table, bed or toilet, negotiates 3% grade: modified independent FIM - Locomotion: Ambulation Locomotion: Ambulation Assistive Devices: Designer, industrial/product Ambulation/Gait Assistance: 5: Supervision Locomotion: Ambulation: 5: Travels 150 ft or more with supervision/safety issues  Comprehension Comprehension Mode: Auditory Comprehension: 5-Understands complex 90% of the time/Cues < 10% of the time  Expression Expression Mode: Verbal Expression: 5-Expresses basic needs/ideas: With no assist  Social Interaction Social Interaction: 5-Interacts appropriately 90% of the time - Needs monitoring or encouragement for participation or interaction.  Problem Solving Problem Solving: 4-Solves basic 75 - 89% of the time/requires cueing 10 - 24% of the time  Memory Memory: 4-Recognizes or recalls 75 - 89% of the time/requires cueing 10 - 24% of the time Medical Problem List and Plan:  1. DVT Prophylaxis/Anticoagulation: Pharmaceutical: Lovenox  2. Pain Management: Acetaminophen  3. Mood: Zoloft ego support - helping 4. CLL/bone marrow transplant/: mgt per oncology  -outpt follow up 5. GVHD:  Per heme onc, immunosuppresants/antifungals 6. HTN: normodyne with control at present 7. CKD: regular BMET's, monitor  I's and O's, weights. 8. Afib: rate controlled at present. Follow for activity tolerance. Cont Rx 9. Steroid induced DM: follow CBG's, SSI as needed 10. Pancytopenia: serial CBC's. Close monitoring of clinical status, no active infection signs at present. Counts appear relatively stable today  -mgt per oncology regs 11. GI: diarrhea, N/V - resolving 12.  Anorexia added marinol, monitor intake  -intake showing signs of improvement (ate 100% of breakfast) 13.  Osteoporosis steroid induced,  L3 fracture new since Sept, pt comfortable when sedentary, LBP with movement.  No need for vertebroplasty.     Hydrocodone is helpful   LOS (Days) 17 A FACE TO FACE EVALUATION WAS PERFORMED  Sonda Primes 12/26/2012 9:19 AM

## 2012-12-26 NOTE — Progress Notes (Signed)
Occupational Therapy Session Note  Patient Details  Name: Timothy [name] MRN: [id number] Date of Birth: [birth year]  Today's Date: 12/26/2012 Time:  -   0815-0900  (45 min)    Short Term Goals: Week 1:  OT Short Term Goal 1 (Week 1): Pt will perform toilet transfers on standard height toilet with supervision OT Short Term Goal 1 - Progress (Week 1): Met OT Short Term Goal 2 (Week 1): Pt will perform toileting with supervision OT Short Term Goal 2 - Progress (Week 1): Met OT Short Term Goal 3 (Week 1): Pt will perform LB dressing with supervision OT Short Term Goal 3 - Progress (Week 1): Progressing toward goal OT Short Term Goal 4 (Week 1): Pt will perform walk-in shower transfer with supervisoin OT Short Term Goal 4 - Progress (Week 1): Met Week 2:     Skilled Therapeutic Interventions/Progress Updates:    Addressed functional balance, mobility, endurance, activity tolerance, energy conservation, transfers.  Pt. Lying in bed finishing breakfast.  Used rails and HOB up to get to EOB.  Ambulated to sink after OT provided set up and stood 90 % of session to shave, bathe, and dress.  Ambulated with Rolator walker to bathroom and transferred to 3n1 with supervision.  Pt. Ambulated back to sink to complete dressing.  He was mildly dyspneic at end of session.  Pt. Did not use AE for any LB dressing and did not wash feet.  Pt. Transferred to recliner and feet elevated.  Nursing was called to wrap legs for edema control.     Therapy Documentation Precautions:  Precautions Precautions: Fall Precaution Comments: AFIB, chronic leukemia, immuno compromised, graft vs. host disease; LE edema, ace wrap and elevate LE  Restrictions Weight Bearing Restrictions: No      Pain:  3/10 low back pain  See FIM for current functional status  Therapy/Group: Individual Therapy  Humberto Seals 12/26/2012, 9:29 AM

## 2012-12-27 ENCOUNTER — Inpatient Hospital Stay (HOSPITAL_COMMUNITY): Payer: BC Managed Care – PPO

## 2012-12-27 ENCOUNTER — Inpatient Hospital Stay (HOSPITAL_COMMUNITY): Payer: BC Managed Care – PPO | Admitting: Physical Therapy

## 2012-12-27 ENCOUNTER — Inpatient Hospital Stay (HOSPITAL_COMMUNITY): Payer: Self-pay | Admitting: Physical Therapy

## 2012-12-27 DIAGNOSIS — R5381 Other malaise: Secondary | ICD-10-CM

## 2012-12-27 LAB — CBC WITH DIFFERENTIAL/PLATELET
Basophils Relative: 0 % (ref 0–1)
Eosinophils Absolute: 0 10*3/uL (ref 0.0–0.7)
Hemoglobin: 8.8 g/dL — ABNORMAL LOW (ref 13.0–17.0)
MCH: 30.2 pg (ref 26.0–34.0)
MCHC: 34.9 g/dL (ref 30.0–36.0)
Monocytes Absolute: 0.3 10*3/uL (ref 0.1–1.0)
Neutrophils Relative %: 24 % — ABNORMAL LOW (ref 43–77)
RDW: 18.9 % — ABNORMAL HIGH (ref 11.5–15.5)

## 2012-12-27 LAB — COMPREHENSIVE METABOLIC PANEL
BUN: 19 mg/dL (ref 6–23)
Calcium: 6.9 mg/dL — ABNORMAL LOW (ref 8.4–10.5)
GFR calc Af Amer: >90 mL/min (ref >90)
Glucose, Bld: 96 mg/dL (ref 70–99)
Sodium: 136 mEq/L (ref 135–145)
Total Protein: 3.1 g/dL — ABNORMAL LOW (ref 6.0–8.3)

## 2012-12-27 LAB — PHOSPHORUS: Phosphorus: 2 mg/dL — ABNORMAL LOW (ref 2.3–4.6)

## 2012-12-27 LAB — IGG, IGA, IGM: IgG (Immunoglobin G), Serum: 251 mg/dL — ABNORMAL LOW (ref 650–1600)

## 2012-12-27 MED ORDER — PREDNISONE 20 MG PO TABS
20.0000 mg | ORAL_TABLET | Freq: Every day | ORAL | Status: DC
  Administered 2012-12-27 – 2012-12-29 (×3): 20 mg via ORAL
  Filled 2012-12-27 (×4): qty 1

## 2012-12-27 NOTE — Progress Notes (Signed)
Timothy [name]   DOB:03-14-1946   ZO#:[id number]   JWJ#:[id number]  Subjective: had a partially formed BM this AM, was able to get to the BR; feels he is making good progress with PT; does not think he will need a hospital bed--he already has a w/c, BSC and walker. Looking forward to finally getting home of course with continuing home PT  Objective: middle aged white male examined in bed Filed Vitals:   12/27/12 0415  BP: 146/86  Pulse: 73  Temp: 98.2 F (36.8 C)  Resp: 18    Body mass index is 29.83 kg/(m^2).  Intake/Output Summary (Last 24 hours) at 12/27/12 0839 Last data filed at 12/27/12 0415  Gross per 24 hour  Intake    600 ml  Output    650 ml  Net    -50 ml     Sclerae unicteric, eyelids normal  Oropharynx clear, slightly dry  No peripheral adenopathy  Lungs no rales or rhonchi  Heart regular rate and rhythm, no murmur appreciated  Abdomen soft, NT, +BS  MSK normal joint flexibility, bilateral LE edema as before  Skin: normal turgor  Neuro nonfocal, A&O x3, positive affect  Scheduled Meds:    . acyclovir  400 mg Oral BID  . antiseptic oral rinse  15 mL Mouth Rinse q12n4p  . budesonide  9 mg Oral Daily  . dronabinol  2.5 mg Oral BID AC  . enoxaparin (LOVENOX) injection  40 mg Subcutaneous Q24H  . feeding supplement (VITAL 1.5 CAL)  237 mL Oral TID WC & HS  . fluconazole  100 mg Oral q morning - 10a  . HYDROcodone-acetaminophen  1 tablet Oral Q breakfast  . labetalol  200 mg Oral BID  . loperamide  4 mg Oral BID WC  . miconazole   Topical BID  . pantoprazole  40 mg Oral Daily  . predniSONE  30 mg Oral Q breakfast  . prochlorperazine  5 mg Oral TID AC  . protein supplement  1 scoop Oral TID WC  . psyllium  1 packet Oral Daily  . saccharomyces boulardii  500 mg Oral BID  . sertraline  100 mg Oral QODAY  . sertraline  50 mg Oral QODAY  . sodium chloride  10-40 mL Intracatheter Q12H  . sulfamethoxazole-trimethoprim  1 tablet Oral 2 times weekly  . tacrolimus   1.5 mg Oral BID  . testosterone  1 patch Transdermal Daily   Continuous Infusions:    . sodium chloride 75 mL/hr at 12/26/12 1933   PRN Meds:.alum & mag hydroxide-simeth, diphenoxylate-atropine, guaiFENesin-dextromethorphan, HYDROcodone-acetaminophen, meclizine, ondansetron, prochlorperazine, sodium chloride    CBG (last 3)  No results found for this basename: GLUCAP:3 in the last 72 hours   Labs:  Lab Results  Component Value Date   WBC 2.7* 12/27/2012   HGB 8.8* 12/27/2012   HCT 25.2* 12/27/2012   MCV 86.6 12/27/2012   PLT 183 12/27/2012   NEUTROABS 0.6* 12/27/2012   Results for Timothy [name] (MRN [id number]) as of 12/23/2012 07:35  Ref. Range 11/18/2012 16:36 11/26/2012 04:30 12/13/2012 16:25  Tacrolimus (FK506) - [facility] No range found 2.9 5.6 6.1 ng/mL    Urine Studies No results found for this basename: UACOL:2,UAPR:2,USPG:2,UPH:2,UTP:2,UGL:2,UKET:2,UBIL:2,UHGB:2,UNIT:2,UROB:2,ULEU:2,UEPI:2,UWBC:2,URBC:2,UBAC:2,CAST:2,CRYS:2,UCOM:2,BILUA:2 in the last 72 hours  Basic Metabolic Panel:  Lab 12/27/12 8469  NA 136  K 3.9  CL 108  CO2 22  GLUCOSE 96  BUN 19  CREATININE 0.63  CALCIUM 6.9*  MG 1.8  PHOS 2.0*   GFR Estimated Creatinine Clearance: 95.3  ml/min (by C-G formula based on Cr of 0.63). Liver Function Tests:  Lab 12/27/12 0440  AST 18  ALT 18  ALKPHOS 91  BILITOT 0.2*  PROT 3.1*  ALBUMIN 1.4*   No results found for this basename: LIPASE:5,AMYLASE:5 in the last 168 hours No results found for this basename: AMMONIA:5 in the last 168 hours Coagulation profile No results found for this basename: INR:5,PROTIME:5 in the last 168 hours  CBC:  Lab 12/27/12 0440  WBC 2.7*  NEUTROABS 0.6*  HGB 8.8*  HCT 25.2*  MCV 86.6  PLT 183   Cardiac Enzymes: No results found for this basename: CKTOTAL:5,CKMB:5,CKMBINDEX:5,TROPONINI:5 in the last 168 hours BNP: No components found with this basename: POCBNP:5 CBG: No results found for this basename: GLUCAP:5 in the  last 168 hours D-Dimer No results found for this basename: DDIMER:2 in the last 72 hours Hgb A1c No results found for this basename: HGBA1C:2 in the last 72 hours Lipid Profile No results found for this basename: CHOL:2,HDL:2,LDLCALC:2,TRIG:2,CHOLHDL:2,LDLDIRECT:2 in the last 72 hours Thyroid function studies No results found for this basename: TSH,T4TOTAL,FREET3,T3FREE,THYROIDAB in the last 72 hours Anemia work up No results found for this basename: VITAMINB12:2,FOLATE:2,FERRITIN:2,TIBC:2,IRON:2,RETICCTPCT:2 in the last 72 hours Microbiology Recent Results (from the past 240 hour(s))  CLOSTRIDIUM DIFFICILE BY PCR     Status: Normal   Collection Time   12/18/12  8:31 PM      Component Value Range Status Comment   C difficile by pcr NEGATIVE  NEGATIVE Final       Studies:  No results found.  REHAB STATUS: 1. Functional deficits secondary to deconditioning related CLL recent bone marrow transplant and multiple medical conditions which require 3+ hours per day of interdisciplinary therapy in a comprehensive inpatient rehab setting.  Physiatrist is providing close team supervision and 24 hour management of active medical problems listed below.  Physiatrist and rehab team continue to assess barriers to discharge/monitor patient progress toward functional and medical goals.  FIM:  FIM - Bathing  Bathing Steps Patient Completed: Chest;Right Arm;Left Arm;Abdomen;Front perineal area;Buttocks;Right upper leg;Left upper leg  Bathing: 4: Min-Patient completes 8-9 46f 10 parts or 75+ percent  FIM - Upper Body Dressing/Undressing  Upper body dressing/undressing steps patient completed: Thread/unthread right sleeve of pullover shirt/dresss;Thread/unthread left sleeve of pullover shirt/dress;Put head through opening of pull over shirt/dress;Pull shirt over trunk  Upper body dressing/undressing: 5: Set-up assist to: Obtain clothing/put away  FIM - Lower Body Dressing/Undressing  Lower body  dressing/undressing steps patient completed: Thread/unthread right pants leg;Thread/unthread left pants leg;Pull pants up/down  Lower body dressing/undressing: 3: Mod-Patient completed 50-74% of tasks  FIM - Toileting  Toileting steps completed by patient: Adjust clothing prior to toileting;Performs perineal hygiene;Adjust clothing after toileting  Toileting Assistive Devices: Grab bar or rail for support  Toileting: 5: Supervision: Safety issues/verbal cues  FIM - Therapist, nutritional Devices: Grab bars;Walker  Toilet Transfers: 6-To toilet/ BSC  FIM - Biochemist, clinical Devices: TEFL teacher: 3: Sit > Supine: Mod A (lifting assist/Pt. 50-74%/lift 2 legs);5: Chair or W/C > Bed: Supervision (verbal cues/safety issues)  FIM - Locomotion: Wheelchair  Distance: 250  Locomotion: Wheelchair: 5: Travels 150 ft or more: maneuvers on rugs and over door sills with supervision, cueing or coaxing  FIM - Locomotion: Ambulation  Locomotion: Ambulation Assistive Devices: Designer, industrial/product  Ambulation/Gait Assistance: 5: Supervision  Locomotion: Ambulation: 5: Travels 150 ft or more with supervision/safety issues  Comprehension  Comprehension Mode: Auditory  Comprehension: 5-Follows basic conversation/direction: With no assist  Expression  Expression Mode: Verbal  Expression: 5-Expresses basic needs/ideas: With no assist  Social Interaction  Social Interaction: 5-Interacts appropriately 90% of the time - Needs monitoring or encouragement for participation or interaction.  Problem Solving  Problem Solving: 4-Solves basic 75 - 89% of the time/requires cueing 10 - 24% of the time  Memory  Memory: 4-Recognizes or recalls 75 - 89% of the time/requires cueing 10 - 24% of the time  Assessment: 67 y.o. [place] man with a history of well-differentiated lymphocytic lymphoma/ chronic lymphoid leukemia initially diagnosed in 2000, not  requiring intervention until 2006; with multiple chromosomal abnormalities; s/p allogeneic transplant March 2013 at "the [facility]", now admitted with GVHD. His treatment history is as follows:  (1) fludarabine/cyclophosphamide/rituximab x5 completed May 2007.  (2) rituximab for 8 doses October 2010, with partial response  (3) Leustatin and ofatumumab weekly x8 July to September 2011 followed by maintenance ofatumumab maintenance ofatumumab every 2 months, with initial response but rising counts September 2012  (4) status-post unrelated donor stem-cell transplant 02/24/2012 at the [facility] in [place]  (a) conditioning regimen consisted of fludarabine + TBI at 200 cGy, followed by rituximab x27;  (b) CMV reactivation x3 (patient CMV positive, donor negative), s/p ganciclovir treatment; 3d reactivation August 2013, s/p gancyclovir, with negative PCR mid-September 2013; last gancyclovir dose 10/06/2012  (c) GVHD: involving gut and skin, treated with steroids, tacrolimus and MMF initially, MMF eventually d/c'd and tacrolimus tapered to current dose (d) atrial fibrillation: resolved on brief amiodarone regimen  (e) steroid-induced myopathy  (f) hypomagnesemia: resolved with discontinuation of gancyclovir  (g) hypogammaglobulinemia: s/p IVIG most recently 12/03/2012  (h) elevated triglycerides (606 on 07/14/2012)  (i) adrenal insufficiency: on prednisone and budesonide  (j) pancytopenia; off growth factors currently  (5) restaging studies September 2013 including CT scans, flow cytometry, and bone marrow biopsy, showed no evidence of residual chronic lymphoid leukemia.  (6) recurrent GVHD (skin rash, mouth changes, severe diarrhea and gastric/duodenal/colonic biopsies 11/17/2012 c/w GVHD grade 2) now only remaining sign is mild but bothersome diarrhea; c diff negative x3; continuing current regimen (7) severe malnutrition -- on vital in addition to regular diet (8) testosterone deficiency--on patch (9)  deconditioning: ongoing REHAB  (10) mild dehydration: encouraged increased po fluids; may need IVF support intermittently  (11) steroid-induced osteoporosis with compression fractures: received pamidronate 12/18/2012 (12) nausea: improved on current meds  Plan: I have discussed the patient's situation with the MDs in [place]; they feel his acute/on/chronic GVHD is much improved (as per our reports) and there is no urgency for him to be seen there. They suggest continuing current meds, tapering prednisone as tolerated, and working on nutrition and PT. Discussed with patient. Will drop prednisone to 20 mg/ AM. Tacrolimus and IgG levels pending. He may need epo and or GCSF depending on repeat labs in AM If there are additional issues you may contact the [facility] in [place] directly at [phone]     [facility] C MD 12/27/2012

## 2012-12-27 NOTE — Progress Notes (Signed)
Occupational Therapy Session Note  Patient Details  Name: Timothy [name] MRN: [id number] Date of Birth: Jan 10, 1946  Today's Date: 12/27/2012  Session 1 Time: 0800-0900 Time Calculation (min): 60 min  Skilled Therapeutic Interventions/Progress Updates:    Pt in bed finishing breakfast upon arrival but agreeable to engaging in bathing and dressing tasks at sink.  Pt declined shower this morning but agreed to shower tomorrow morning.  Pt requested to use BSC prior to bathing and dressing tasks.  Pt completed approx 50% of bathing and dressing tasks while standing with no LOB noted.  Pt required assistance with donning socks secondary ACE wraps on bilateral feet and legs.  Pt transitioned to functional amb with Rollator for home mgmt tasks in day room.  Pt walk from room to day room with no rest breaks.  Focus on activity tolerance, dynamic standing balance, and safety awareness.  Therapy Documentation Precautions:  Precautions Precautions: Fall Precaution Comments: AFIB, chronic leukemia, immuno compromised, graft vs. host disease; LE edema, ace wrap and elevate LE  Restrictions Weight Bearing Restrictions: No   Pain: Pain Assessment Pain Assessment: No/denies pain  See FIM for current functional status  Therapy/Group: Individual Therapy  Session 2 Time: 1300-1345 Pt c/o low back pain 7/10 at end of sessin; repositioned and KPad applied to affected area. Individual Therapy  Pt amb with Rollator to gym to engage in dynamic standing balance tasks including activities on Wii Balance Board.  Pt completed activities with BUE support and supervision and steady A when completing activities without Rollator.  Pt transitioned to reaching tasks with focus on weight shift onto LLE laterally and forward while reaching.  Lavone Neri [facility] 12/27/2012, 9:05 AM

## 2012-12-27 NOTE — Progress Notes (Signed)
Physical Therapy Note  Patient Details  Name: Conley Delisle MRN: [id number] Date of Birth: 1946/02/14 Today's Date: 12/27/2012  1000-1055 (55 minutes) individual Pain : pt reports unrated back pain /premedicated Focus of treatment: gait training/endurance/steps ; therapeutic exercises for LE strengthening; therapeutic activities focused on standing tolerance Treatment: Attempted flight of steps (12) , pt able to perform on 3 before LE fatigue; up/down 4 inch step with rollator support  Alternating LEs  1 X 10  , 1 X 5 ; standing tolerance - pt performed Wii bowling in standing 2 X 5 minutes with seated rest break; gait - to/from room to gym  120 feet rollator SBA.    1130 Pt missed 30 minutes of PT session -refused secondary to fatigue    Merik Mignano,JIM 12/27/2012, 7:28 AM

## 2012-12-27 NOTE — Progress Notes (Signed)
Recreational Therapy Session Note  Patient Details  Name: Timothy [name] MRN: [id number] Date of Birth: Sep 23, 1946 Today's Date: 12/27/2012 Time:  10-030 Pain: c/o back pain, unrated, premedicated  Skilled Therapeutic Interventions/Progress Updates: PT ambulated with rollator from room to stairwell where pt attempted a flight (12) stairs.  Pt able to ascend 3 before having to descend due to LE weakness/fatigue.  After seated rest break, pt ambulated to the gym and stood to play wii bowling without UE support 5 min x2.  Session shortened due to bowel issues.  Therapy/Group: Co-Treatment Adisyn Ruscitti 12/27/2012, 9:47 AM

## 2012-12-27 NOTE — Progress Notes (Signed)
Patient ID: Timothy [name], male   DOB: 1945/12/24, 67 y.o.   MRN: [id number] Subjective/Complaints: Continues to be encouraged by progress. Eating more. Stools more formed.  Review of Systems   All other systems reviewed and are negative.    Objective: Vital Signs: Blood pressure 146/86, pulse 73, temperature 98.2 F (36.8 C), temperature source Oral, resp. rate 18, height 5\' 7"  (1.702 m), weight 86.4 kg (190 lb 7.6 oz), SpO2 94.00%. No results found.  Basename 12/27/12 0440  WBC 2.7*  HGB 8.8*  HCT 25.2*  PLT 183    Basename 12/27/12 0440  NA 136  K 3.9  CL 108  GLUCOSE 96  BUN 19  CREATININE 0.63  CALCIUM 6.9*   CBG (last 3)  No results found for this basename: GLUCAP:3 in the last 72 hours  Wt Readings from Last 3 Encounters:  12/27/12 86.4 kg (190 lb 7.6 oz)  12/05/12 84 kg (185 lb 3 oz)  12/03/12 86.3 kg (190 lb 4.1 oz)    Physical Exam:  Constitutional: He is oriented to person, place, and time. He appears well-developed. pale HENT:  Head: Normocephalic and atraumatic.  Right Ear: External ear normal.  Left Ear: External ear normal.  Nose: Nose normal.  Mouth/Throat: Oropharynx is clear and moist.  Eyes: Conjunctivae normal and EOM are normal. Pupils are equal, round, and reactive to light.  Neck: Normal range of motion. Neck supple.  Cardiovascular: Normal rate, regular rhythm, normal heart sounds and intact distal pulses.  Respiratory: Effort normal and breath sounds normal. No respiratory distress. He has no wheezes. He has no rales. He exhibits no tenderness.  GI: Soft. Bowel sounds are scarce. He exhibits no distension. There is tenderness. There is no rebound and no guarding.  Genitourinary: Penis normal.  Musculoskeletal: Normal range of motion.  1+ edema bilateral pretibial 1++ bilateral feet , left more than right Neurological: He is alert and oriented to person, place, and time. He has normal reflexes. He displays no tremor. He exhibits normal  muscle tone. Gait abnormal.  Motor strength is 4/5 in bilateral deltoid, biceps, triceps, grip 4 minus/5 in bilateral hip flexors knee extensors 4/5 bilateral ankle dorsiflexor plantar flexor some pain in addition with proximal leg movements Skin: Skin is warm and dry. Color better. Psychiatric: He has a normal mood and affect. His behavior is normal. Judgment and thought content normal. More up beat as a whole   Assessment/Plan: 1. Functional deficits secondary to deconditioning related CLL recent bone marrow transplant and multiple medical conditions which require 3+ hours per day of interdisciplinary therapy in a comprehensive inpatient rehab setting. Physiatrist is providing close team supervision and 24 hour management of active medical problems listed below. Physiatrist and rehab team continue to assess barriers to discharge/monitor patient progress toward functional and medical goals. FIM: FIM - Bathing Bathing Steps Patient Completed: Chest;Right Arm;Left Arm;Abdomen;Front perineal area;Buttocks;Right upper leg;Left upper leg;Left lower leg (including foot);Right lower leg (including foot) Bathing: 5: Supervision: Safety issues/verbal cues  FIM - Upper Body Dressing/Undressing Upper body dressing/undressing steps patient completed: Thread/unthread right sleeve of pullover shirt/dresss;Thread/unthread left sleeve of pullover shirt/dress;Put head through opening of pull over shirt/dress;Pull shirt over trunk Upper body dressing/undressing: 7: Complete Independence: No helper FIM - Lower Body Dressing/Undressing Lower body dressing/undressing steps patient completed: Thread/unthread right pants leg;Thread/unthread left pants leg;Pull underwear up/down;Don/Doff right sock;Don/Doff left sock Lower body dressing/undressing: 5: Set-up assist to: Obtain clothing  FIM - Toileting Toileting steps completed by patient: Adjust clothing prior to toileting;Performs  perineal hygiene;Adjust clothing  after toileting Toileting Assistive Devices: Grab bar or rail for support Toileting: 5: Supervision: Safety issues/verbal cues  FIM - Diplomatic Services operational officer Devices: Elevated toilet seat Toilet Transfers: 5-To toilet/BSC: Supervision (verbal cues/safety issues);5-From toilet/BSC: Supervision (verbal cues/safety issues)  FIM - Press photographer Assistive Devices: Bed rails;HOB elevated Bed/Chair Transfer: 5: Supine > Sit: Supervision (verbal cues/safety issues);5: Bed > Chair or W/C: Supervision (verbal cues/safety issues)  FIM - Locomotion: Wheelchair Distance: 100 Locomotion: Wheelchair: 5: Travels 50 - 149 ft, turns around, maneuvers to table, bed or toilet, negotiates 3% grade: modified independent FIM - Locomotion: Ambulation Locomotion: Ambulation Assistive Devices: Designer, industrial/product Ambulation/Gait Assistance: 5: Supervision Locomotion: Ambulation: 7: Travels 150 ft or more independently  Comprehension Comprehension Mode: Auditory Comprehension: 5-Understands complex 90% of the time/Cues < 10% of the time  Expression Expression Mode: Verbal Expression: 5-Expresses basic needs/ideas: With no assist  Social Interaction Social Interaction: 5-Interacts appropriately 90% of the time - Needs monitoring or encouragement for participation or interaction.  Problem Solving Problem Solving: 4-Solves basic 75 - 89% of the time/requires cueing 10 - 24% of the time  Memory Memory: 4-Recognizes or recalls 75 - 89% of the time/requires cueing 10 - 24% of the time Medical Problem List and Plan:  1. DVT Prophylaxis/Anticoagulation: Pharmaceutical: Lovenox  2. Pain Management: Acetaminophen  3. Mood: Zoloft ego support 4. CLL/bone marrow transplant/: mgt per oncology  -outpt follow up 5. GVHD:  Per heme onc, immunosuppresants/antifungals 6. HTN: normodyne with control at present 7. CKD: regular BMET's, monitor I's and O's, weights. 8. Afib: rate  controlled at present. Follow for activity tolerance 9. Steroid induced DM: follow CBG's, SSI as needed 10. Pancytopenia: serial CBC's. Close monitoring of clinical status, no active infection signs at present. Counts appear relatively stable today  -mgt per oncology regs 11. GI: diarrhea, N/V,. Stools still generally loose but showing decreased frequency and some increase in form  -maintain fluids and electrolytes-  Encouraged supplements  -  scheduled immodium, probiotic, has been evaluated by GI during this hospitalization already for diarrhea  -antiemetics, compazine 12. Mood: zoloft   -continue IVF given BUN  -labs are generally stable  -stools have improved somewhat  12.  Anorexia added marinol, monitor intake  -intake showing signs of improvement overall  -his energy and appearance are both improving 13.  Osteoporosis steroid induced,  L3 fracture new since Sept, pt comfortable when sedentary, LBP with movement.  No need for vertebroplasty.     Hydrocodone is helpful   LOS (Days) 18 A FACE TO FACE EVALUATION WAS PERFORMED  Jakalyn Kratky T 12/27/2012 9:13 AM

## 2012-12-28 ENCOUNTER — Inpatient Hospital Stay (HOSPITAL_COMMUNITY): Payer: BC Managed Care – PPO

## 2012-12-28 ENCOUNTER — Ambulatory Visit (HOSPITAL_COMMUNITY): Payer: Self-pay | Admitting: Physical Therapy

## 2012-12-28 ENCOUNTER — Inpatient Hospital Stay (HOSPITAL_COMMUNITY): Payer: Self-pay | Admitting: Physical Therapy

## 2012-12-28 LAB — CBC WITH DIFFERENTIAL/PLATELET
Basophils Absolute: 0 10*3/uL (ref 0.0–0.1)
Eosinophils Absolute: 0 10*3/uL (ref 0.0–0.7)
HCT: 28.1 % — ABNORMAL LOW (ref 39.0–52.0)
Lymphs Abs: 3.2 10*3/uL (ref 0.7–4.0)
MCHC: 34.9 g/dL (ref 30.0–36.0)
MCV: 86.5 fL (ref 78.0–100.0)
Neutro Abs: 0.6 10*3/uL — ABNORMAL LOW (ref 1.7–7.7)
RDW: 19.3 % — ABNORMAL HIGH (ref 11.5–15.5)

## 2012-12-28 LAB — TACROLIMUS LEVEL: Tacrolimus (FK506) - LabCorp: 21.2 ng/mL

## 2012-12-28 NOTE — Progress Notes (Signed)
Occupational Therapy Session Note  Patient Details  Name: Timothy [name] MRN: [id number] Date of Birth: 02-04-1946  Today's Date: 12/28/2012  Session 1 Time: 0800-0915 Time Calculation (min): 75 min  Short Term Goals: Week 2:   STG=LTG  Skilled Therapeutic Interventions/Progress Updates:    Pt engaged in bathing at shower level and dressing with sit<>stand from w/c.  Pt used Rollator to ambulate through room to gather clothing and supplies before transferring to walk-in shower.  Pt used AE (long handle sponge and reacher) appropriately to assist with tasks.  Pt able to cross legs to don socks after ACE wraps applied to BLE.  Pt transitioned to functional amb with Rollator for home mgmt tasks.  Continued education with patient regarding energy conservations, safety at home, and DME needs at home.  Pt owns BSC and shower seat.  Therapy Documentation Precautions:  Precautions Precautions: Fall Precaution Comments: AFIB, chronic leukemia, immuno compromised, graft vs. host disease; LE edema, ace wrap and elevate LE  Restrictions Weight Bearing Restrictions: No   Pain: Pain Assessment Pain Score:   5 Pain Type: Chronic pain Pain Location: Back Pain Orientation: Lower Pain Intervention(s): Repositioned;Ambulation/increased activity (pt reports he thinks he has had pain medication this morning)  See FIM for current functional status  Therapy/Group: Individual Therapy  Session 2 Time: 1400-1430 Individual Therapy  Pt amb with Rollator to gym to engage in dynamic standing activities with weighted ball (.5 kg and 1kg) against mini trampoline at varying angle to facilitate trunk rotation, weight shifts, and increased activity tolerance.  Pt transitioned to PNF excercises seated on mat.  Pt amb with Rollator back to room and returned to recliner with call bell within reach.  Lavone Neri [facility] 12/28/2012, 10:45 AM

## 2012-12-28 NOTE — Progress Notes (Signed)
Occupational Therapy Discharge Summary  Patient Details  Name: Timothy [name] MRN: [id number] Date of Birth: 08/25/1946  Today's Date: 12/28/2012  Patient has met 8 of 8 long term goals due to improved activity tolerance, improved balance, postural control, ability to compensate for deficits, improved attention, improved awareness and improved coordination.  Pt made steady progress with bathing, dressing, toilet transfers and toileting, and shower transfers.  Pt continues to exhibit periods of fatigue which limits patients endurance and requires patient to incorporate multiple rest breaks into routines.  Pt is mod I for all BADLs using AE appropriately for LB bathing and dressing tasks.  Patient to discharge at overall Modified Independent level.  Recommendation: No additional occupational therapy recommended at this time.   Equipment: No equipment provided Pt owns BSC and shower seat.  Reasons for discharge: treatment goals met and discharge from hospital  Patient/family agrees with progress made and goals achieved: Yes  Precautions/Restrictions  Precautions Precautions: Fall Precaution Comments: AFIB, chronic leukemia, immuno compromised, graft vs. host disease; LE edema, ace wrap and elevate LE   Pain Pain Assessment Pain Score:   5 Pain Type: Chronic pain Pain Location: Back Pain Orientation: Lower Pain Intervention(s): Repositioned;Ambulation/increased activity (pt reports he thinks he has had pain medication this morning)  ADL ADL Equipment Provided: Reacher;Sock aid;Long-handled shoe horn;Long-handled sponge Eating: Independent Where Assessed-Eating: Chair Grooming: Independent Where Assessed-Grooming: Sitting at sink Upper Body Bathing: Modified independent Where Assessed-Upper Body Bathing: Shower Lower Body Bathing: Modified independent Where Assessed-Lower Body Bathing: Shower Upper Body Dressing: Independent Where Assessed-Upper Body Dressing: Wheelchair Lower  Body Dressing: Modified independent Where Assessed-Lower Body Dressing: Standing at sink;Sitting at sink;Wheelchair Toileting: Modified independent Where Assessed-Toileting: Hotel manager: Modified independent Statistician Method: Proofreader: Bedside commode;Grab bars Film/video editor: Modified independent Film/video editor Method: Designer, industrial/product: Information systems manager with back;Grab bars  Vision/Perception  Vision - History Baseline Vision: Wears glasses all the time Patient Visual Report: No change from baseline Vision - Assessment Eye Alignment: Within Functional Limits Vision Assessment: Vision not tested Perception Perception: Within Functional Limits Praxis Praxis: Intact   Cognition Overall Cognitive Status: Appears within functional limits for tasks assessed Arousal/Alertness: Awake/alert Orientation Level: Oriented X4  Sensation Sensation Light Touch: Appears Intact Stereognosis: Not tested Hot/Cold: Appears Intact Proprioception: Appears Intact Coordination Fine Motor Movements are Fluid and Coordinated: Yes  Trunk/Postural Assessment  Cervical Assessment Cervical Assessment: Within Functional Limits Thoracic Assessment Thoracic Assessment: Within Functional Limits Lumbar Assessment Lumbar Assessment: Within Functional Limits Postural Control Postural Control: Within Functional Limits   Extremity/Trunk Assessment RUE Assessment RUE Assessment: Within Functional Limits LUE Assessment LUE Assessment: Within Functional Limits  See FIM for current functional status  Rich Brave 12/28/2012, 10:44 AM

## 2012-12-28 NOTE — Progress Notes (Signed)
Patient ID: Timothy [name], male   DOB: Sep 12, 1946, 67 y.o.   MRN: [id number] Subjective/Complaints: Improved stamina and appetite. Stool pattern/consistency fluctuating a bit  Review of Systems   All other systems reviewed and are negative.    Objective: Vital Signs: Blood pressure 128/77, pulse 79, temperature 98.2 F (36.8 C), temperature source Oral, resp. rate 18, height 5\' 7"  (1.702 m), weight 97.1 kg (214 lb 1.1 oz), SpO2 93.00%. No results found.  Basename 12/28/12 0700 12/27/12 0440  WBC 4.2 2.7*  HGB 9.8* 8.8*  HCT 28.1* 25.2*  PLT 194 183    Basename 12/27/12 0440  NA 136  K 3.9  CL 108  GLUCOSE 96  BUN 19  CREATININE 0.63  CALCIUM 6.9*   CBG (last 3)  No results found for this basename: GLUCAP:3 in the last 72 hours  Wt Readings from Last 3 Encounters:  12/28/12 97.1 kg (214 lb 1.1 oz)  12/05/12 84 kg (185 lb 3 oz)  12/03/12 86.3 kg (190 lb 4.1 oz)    Physical Exam:  Constitutional: He is oriented to person, place, and time. He appears well-developed. pale HENT:  Head: Normocephalic and atraumatic.  Right Ear: External ear normal.  Left Ear: External ear normal.  Nose: Nose normal.  Mouth/Throat: Oropharynx is clear and moist.  Eyes: Conjunctivae normal and EOM are normal. Pupils are equal, round, and reactive to light.  Neck: Normal range of motion. Neck supple.  Cardiovascular: Normal rate, regular rhythm, normal heart sounds and intact distal pulses.  Respiratory: Effort normal and breath sounds normal. No respiratory distress. He has no wheezes. He has no rales. He exhibits no tenderness.  GI: Soft. Bowel sounds are scarce. He exhibits no distension. There is tenderness. There is no rebound and no guarding.  Genitourinary: Penis normal.  Musculoskeletal: Normal range of motion.  1+ edema bilateral pretibial 1++ bilateral feet , left more than right Neurological: He is alert and oriented to person, place, and time. He has normal reflexes. He  displays no tremor. He exhibits normal muscle tone. Gait abnormal.  Motor strength is 4/5 in bilateral deltoid, biceps, triceps, grip 4 minus/5 in bilateral hip flexors knee extensors 4/5 bilateral ankle dorsiflexor plantar flexor some pain in addition with proximal leg movements Skin: Skin is warm and dry. Color better. Psychiatric: He has a normal mood and affect. His behavior is normal. Judgment and thought content normal. More up beat as a whole   Assessment/Plan: 1. Functional deficits secondary to deconditioning related CLL recent bone marrow transplant and multiple medical conditions which require 3+ hours per day of interdisciplinary therapy in a comprehensive inpatient rehab setting. Physiatrist is providing close team supervision and 24 hour management of active medical problems listed below. Physiatrist and rehab team continue to assess barriers to discharge/monitor patient progress toward functional and medical goals. FIM: FIM - Bathing Bathing Steps Patient Completed: Chest;Right Arm;Left Arm;Abdomen;Front perineal area;Buttocks;Right upper leg;Left upper leg;Left lower leg (including foot);Right lower leg (including foot) Bathing: 5: Supervision: Safety issues/verbal cues  FIM - Upper Body Dressing/Undressing Upper body dressing/undressing steps patient completed: Thread/unthread right sleeve of pullover shirt/dresss;Thread/unthread left sleeve of pullover shirt/dress;Put head through opening of pull over shirt/dress;Pull shirt over trunk Upper body dressing/undressing: 7: Complete Independence: No helper FIM - Lower Body Dressing/Undressing Lower body dressing/undressing steps patient completed: Thread/unthread right pants leg;Thread/unthread left pants leg;Pull underwear up/down;Don/Doff right sock;Don/Doff left sock Lower body dressing/undressing: 5: Set-up assist to: Obtain clothing  FIM - Toileting Toileting steps completed by patient: Adjust  clothing prior to  toileting;Performs perineal hygiene;Adjust clothing after toileting Toileting Assistive Devices: Grab bar or rail for support Toileting: 5: Supervision: Safety issues/verbal cues  FIM - Diplomatic Services operational officer Devices: Elevated toilet seat Toilet Transfers: 5-To toilet/BSC: Supervision (verbal cues/safety issues);5-From toilet/BSC: Supervision (verbal cues/safety issues)  FIM - Press photographer Assistive Devices: Bed rails;HOB elevated Bed/Chair Transfer: 5: Supine > Sit: Supervision (verbal cues/safety issues);5: Bed > Chair or W/C: Supervision (verbal cues/safety issues)  FIM - Locomotion: Wheelchair Distance: 100 Locomotion: Wheelchair: 5: Travels 50 - 149 ft, turns around, maneuvers to table, bed or toilet, negotiates 3% grade: modified independent FIM - Locomotion: Ambulation Locomotion: Ambulation Assistive Devices: Designer, industrial/product Ambulation/Gait Assistance: 5: Supervision Locomotion: Ambulation: 7: Travels 150 ft or more independently  Comprehension Comprehension Mode: Auditory Comprehension: 5-Understands complex 90% of the time/Cues < 10% of the time  Expression Expression Mode: Verbal Expression: 6-Expresses complex ideas: With extra time/assistive device  Social Interaction Social Interaction: 6-Interacts appropriately with others with medication or extra time (anti-anxiety, antidepressant).  Problem Solving Problem Solving: 4-Solves basic 75 - 89% of the time/requires cueing 10 - 24% of the time  Memory Memory: 4-Recognizes or recalls 75 - 89% of the time/requires cueing 10 - 24% of the time Medical Problem List and Plan:  1. DVT Prophylaxis/Anticoagulation: Pharmaceutical: Lovenox  2. Pain Management: Acetaminophen  3. Mood: Zoloft ego support 4. CLL/bone marrow transplant/: mgt per oncology  -outpt follow up 5. GVHD:  Per heme onc, immunosuppresants/antifungals 6. HTN: normodyne with control at present 7. CKD:  regular BMET's, monitor I's and O's, weights. 8. Afib: rate controlled at present. Follow for activity tolerance 9. Steroid induced DM: follow CBG's, SSI as needed 10. Pancytopenia: serial CBC's. Close monitoring of clinical status, no active infection signs at present. Counts are stable to improved  -mgt per oncology recs 11. GI: diarrhea, N/V,. Stools still generally loose but showing decreased frequency and some increase in form  -maintain fluids and electrolytes-  Encouraged supplements  -  scheduled immodium, probiotic, has been evaluated by GI during this hospitalization already for diarrhea  -antiemetics, compazine 12. Mood: zoloft   -continue IVF given BUN  -labs are generally stable  -stools have improved somewhat but this will be a work in progress once discharged 12.  Anorexia added marinol, monitor intake  -intake showing signs of improvement overall  -his energy and appearance are both improving 13.  Osteoporosis steroid induced,  L3 fracture new since Sept, pt comfortable when sedentary, LBP with movement.  No need for vertebroplasty.     Hydrocodone is helpful   LOS (Days) 19 A FACE TO FACE EVALUATION WAS PERFORMED  SWARTZ,ZACHARY T 12/28/2012 9:26 AM

## 2012-12-28 NOTE — Progress Notes (Signed)
Physical Therapy Discharge Summary  Patient Details  Name: Timothy [name] MRN: [id number] Date of Birth: 30-May-1946  Today's Date: 12/28/2012 Time: 1478-2956 Time Calculation (min): 29 min  Patient has met 8 of 8 long term goals due to improved activity tolerance, improved balance, increased strength and decreased pain.  Patient to discharge at an ambulatory level Min Assist (stairs only, all others modI).   Patient's care partner unavailable to provide the necessary physical (pt reports she can provide min assist needed on stairs to enter home) assistance at discharge.  Reasons goals not met: NA  Recommendation:  Patient will benefit from ongoing skilled PT services in home health setting to continue to advance safe functional mobility, address ongoing impairments in strength, activity tolerance, balance, mobility, and minimize fall risk.  Equipment: No equipment provided  Reasons for discharge: treatment goals met and discharge from hospital  Patient/family agrees with progress made and goals achieved: Yes  PT Discharge Precautions/Restrictions Precautions Precautions: Fall Precaution Comments: AFIB, chronic leukemia, immuno compromised, graft vs. host disease; LE edema, ace wrap and elevate LE  Vital Signs Therapy Vitals Temp: 98 F (36.7 C) Temp src: Oral Pulse Rate: 81  Resp: 20  BP: 119/71 mmHg Patient Position, if appropriate: Sitting Oxygen Therapy SpO2: 93 % O2 Device: None (Room air) Pain Pain Assessment Pain Assessment: 0-10 Pain Score: 0-No pain Vision/Perception     Cognition Overall Cognitive Status: Appears within functional limits for tasks assessed Arousal/Alertness: Awake/alert Orientation Level: Oriented X4 Sensation Sensation Light Touch: Appears Intact Stereognosis: Not tested Hot/Cold: Appears Intact Proprioception: Appears Intact Motor     Mobility Bed Mobility Supine to Sit: 6: Modified independent (Device/Increase time);HOB flat Sit  to Supine: 6: Modified independent (Device/Increase time);HOB flat Transfers Sit to Stand: 6: Modified independent (Device/Increase time);With upper extremity assist;From bed;From chair/3-in-1;With armrests Stand to Sit: 6: Modified independent (Device/Increase time);With upper extremity assist;To bed;To chair/3-in-1;With armrests Stand Pivot Transfers: 6: Modified independent (Device/Increase time);With armrests Stand Pivot Transfer Details (indicate cue type and reason): using rollator Locomotion  Ambulation Ambulation/Gait Assistance: 6: Modified independent (Device/Increase time) Ambulation Distance (Feet): 400 Feet Assistive device: 4-wheeled walker Ambulation/Gait Assistance Details: slow gait speed, one seated rest break needed (<3 min) shuffling steps Gait Gait Pattern: Step-through pattern;Shuffle;Trunk flexed Stairs / Additional Locomotion Stairs: Yes Stairs Assistance: 4: Min assist Stairs Assistance Details (indicate cue type and reason): min assist to boost trunk up over weak legs Stair Management Technique: One rail Left;Sideways Number of Stairs: 10  Height of Stairs:  (4-6") Wheelchair Mobility Wheelchair Mobility: Yes Wheelchair Assistance: 6: Modified independent (Device/Increase time) Occupational hygienist: Both upper extremities Wheelchair Parts Management: Needs assistance Distance: 150  Trunk/Postural Assessment     Balance Static Sitting Balance Static Sitting - Balance Support: Right upper extremity supported;Left upper extremity supported;Feet supported;No upper extremity supported Static Sitting - Comment/# of Minutes: independent Dynamic Sitting Balance Dynamic Sitting - Balance Support: Right upper extremity supported;Left upper extremity supported;Feet supported Dynamic Sitting - Level of Assistance: 6: Modified independent (Device/Increase time) Dynamic Sitting Balance - Compensations: reaching outside of BOS with at least one upper extremity  supported.   Dynamic Standing Balance Dynamic Standing - Balance Support: Right upper extremity supported;Left upper extremity supported;Bilateral upper extremity supported Dynamic Standing - Level of Assistance: 6: Modified independent (Device/Increase time) Dynamic Standing - Comments: During functional tasks in ADL kitchen getting things into and out of fridge and into and out of dishwasher/cabinents.  Always with at least one hand supported on either his walker or the counter.  Extremity Assessment      RLE Strength RLE Overall Strength: Deficits RLE Overall Strength Comments: ankle, knee 4/5, hip flexion 3+/5 LLE Strength LLE Overall Strength: Deficits LLE Overall Strength Comments: ankle DF 4/5, PF 5/5, knee ext 4+/5, knee flexion 5/5, hip flexion 4/5   Skilled Interventions: Session #1: This session focused on gait with rollator to and from gym >150' over tiled and carpeted surfaces mod I, stairs with left rail x 5 steps 4-6" in height min assist to boost up the 6" steps.  Bed mobility in the ADL apartment mod I extra time and effort needed for him to get his own legs into the bed, but he was successful with multiple attempts.  Transfers from various furniture heights mod I with reliance on arms to boost up from lower surfaces.  Functional activities in the ADL kitchen working on getting things in and out of the fridge and in and out of cabinets while using his RW mod I with one hand supported on either cabinets or rollator (locked) at all times.  WC mobility >150' over tiled surfaces and carpeted home-like surfaces with mod I.    Session #2: This session focused on practicing the stairs again with L rail min assist x 10 steps sideways 4-6" in height.  Gait with rollator >400' with mod I with one seated rest break < .  Functional task working on standing tolerance and balance without assistive device of removing ornaments from Christmas tree at various heights and then turning and  placing them in a box at waist height without using his RW.  Mod I for this task (occational resting of hands on furniture or RW for support).    See FIM for current functional status  Lurena Joiner B. Monicia Tse, PT, DPT [phone]   12/28/2012, 5:03 PM

## 2012-12-28 NOTE — Patient Care Conference (Signed)
Inpatient RehabilitationTeam Conference and Plan of Care Update Date: 12/28/2012   Time: 2:25 PM    Patient Name: Timothy [name]      Medical Record Number: [id number]  Date of Birth: [birth year] Sex: Male         Room/Bed: 4149/4149-01 Payor Info: Payor: BLUE CROSS BLUE SHIELD  Plan: [facility] HE[facility] PPO  Product Type: *No Product type*     Admitting Diagnosis: DECONDITIONING MULTI MED  Admit Date/Time:  12/09/2012  1:43 PM Admission Comments: No comment available   Primary Diagnosis:  Physical deconditioning Principal Problem: Physical deconditioning  Patient Active Problem List   Diagnosis Date Noted  . Chronic back pain 12/14/2012  . Physical deconditioning 12/09/2012  . 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 Diarrhea in adult patient 12/05/2012  . CLL (chronic lymphocytic leukemia) 12/05/2012  . Rash of face 12/05/2012  . Diarrhea 08/24/2012  . Dehydration 08/24/2012  . Nausea and vomiting 08/24/2012  . Immunocompromised 08/24/2012  . 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 Diverticular disease   . Hyperlipidemia   . Obesity   . Hypertension   . Hiatal hernia   . CMV (cytomegalovirus) antibody positive   . Right bundle branch block   . CKD (chronic kidney disease) stage 2, GFR 60-89 ml/min   . Pancytopenia   . Steroid-induced diabetes   . Atrial fibrillation   . Myopathy   . Fine tremor   . Leukemia, chronic lymphoid 12/09/2011    Expected Discharge Date: Expected Discharge Date: 12/29/12  Team Members Present: Physician leading conference: Dr. Faith Rogue Social Worker Present: Amada Jupiter, LCSW Nurse Present: Other (comment) Kennon Portela, RN) PT Present: Other (comment) Corinna Capra., PT) OT Present: Edwin Cap, Loistine Chance, OT Other (Discipline and Name): Tora Duck, PPS Coordinator     Current Status/Progress Goal Weekly Team Focus  Medical   improved nutrition and bowel function,    continue to maximize nutrition and stool consistency  increase stamina, finalize dc planning   Bowel/Bladder   pt has diarrhea with stool flucuating from liquid and soft. Pt on lomotil and immodium. Pt uses toliet with assistance  remain continent of bowel and bladder. Resolve some of the diarrhea issue  monitor stool and continue on medication as scheduled   Swallow/Nutrition/ Hydration             ADL's   supervison overall  mod I overall, supervisoin shower transfers  family ed; activity tolerance; safety awareness   Mobility   Gait 150 feet RW SBA; up/down 3 or less steps (variable) min assist with one rail before fatigue; bed mobility without rail supervision x1, mod assit x1 (this is also variable depending on fatigue level).  No equipment needed, HHPT f/u recommended.    3 steps entry min assist with one rail/ pt reports he has bed downstairs now   UE/LE strengthening/endurance, bed mobility, stairs   Communication             Safety/Cognition/ Behavioral Observations            Pain   no complaints of pain         Skin   right picc inplace. Scattered bruising with skin tear to left arm and left hand, dressing cdi. BLE edema with ACE wraps inplace  no futher skin breakdown  assess skin qshift, pressure relief frequently    Rehab Goals Patient on target to meet rehab goals: Yes *See Interdisciplinary Assessment and Plan and  progress notes for long and short-term goals  Barriers to Discharge: bowel incontinence (intermittent), stool consistency    Possible Resolutions to Barriers:  undergarment, timed emptying    Discharge Planning/Teaching Needs:  home with wife to provide needed assistance      Team Discussion:  Continuing to make improvements with function and bowel management.  Team feels he has met targeted goals and ready for d/c.  No concerns expressed.  Revisions to Treatment Plan:  None - ready for d/c   Continued Need for Acute Rehabilitation Level of Care:  The patient requires daily medical management by a physician with specialized training in physical medicine and rehabilitation for the following conditions: Daily analysis of laboratory values and/or radiology reports with any subsequent need for medication adjustment of medical intervention for : Pulmonary problems;Neurological problems;Other (oncology)  Timothy [name] 12/28/2012, 3:49 PM

## 2012-12-28 NOTE — Progress Notes (Signed)
Social Work Patient ID: Timothy [name], male   DOB: [birth year], 67 y.o.   MRN: [id number]  Met with patient this afternoon to review team conference and d/c arrangements in place for tomorrow.  Pt pleased with progress over this past week and feels he is ready for d/c.  Aware that I have made arrangements for [facility] and PT via [facility].  No DME needs (pt already has needed equipment).  Explained that his primary MD office (Dr. Clelia Croft) has informed me that they will contact pt directly to set up a follow up appointment.  No other needs identified.  Yomara Toothman, LCSW

## 2012-12-28 NOTE — Progress Notes (Addendum)
Recreational Therapy Session Note  Patient Details  Name: Timothy [name] MRN: [id number] Date of Birth: 07-Dec-1946 Today's Date: 12/28/2012 Time:  1330-1400  Pain: no c/o Skilled Therapeutic Interventions/Progress Updates: Session focused on activity and standing tolerance.  Pt stood and ambulated short distances with and without rollator walker to remove decorations off of Christmas tree and package them up for storage with Mod I.  Pt ambulated from the dayroom back to his room with rollator with Mod I,  1 seated rest break.   Therapy/Group: Co-Treatment Karlita Lichtman 12/28/2012, 5:03 PM

## 2012-12-29 ENCOUNTER — Other Ambulatory Visit: Payer: Self-pay | Admitting: Oncology

## 2012-12-29 ENCOUNTER — Telehealth: Payer: Self-pay | Admitting: Oncology

## 2012-12-29 DIAGNOSIS — C9110 Chronic lymphocytic leukemia of B-cell type not having achieved remission: Secondary | ICD-10-CM

## 2012-12-29 DIAGNOSIS — R5381 Other malaise: Secondary | ICD-10-CM

## 2012-12-29 MED ORDER — HEPARIN SOD (PORK) LOCK FLUSH 100 UNIT/ML IV SOLN
250.0000 [IU] | INTRAVENOUS | Status: AC | PRN
  Administered 2012-12-29: 500 [IU]

## 2012-12-29 MED ORDER — HYDROCODONE-ACETAMINOPHEN 10-325 MG PO TABS: 1.0000 | ORAL_TABLET | Freq: Every day | ORAL | Status: DC

## 2012-12-29 MED ORDER — DRONABINOL 2.5 MG PO CAPS: 2.5000 mg | ORAL_CAPSULE | Freq: Two times a day (BID) | ORAL | Status: DC

## 2012-12-29 MED ORDER — PSYLLIUM 95 % PO PACK: 1.0000 | PACK | Freq: Every day | ORAL | Status: DC

## 2012-12-29 MED ORDER — PREDNISONE 20 MG PO TABS: 20.0000 mg | ORAL_TABLET | Freq: Every day | ORAL | Status: DC

## 2012-12-29 MED ORDER — BENEPROTEIN PO POWD: 1.0000 | Freq: Three times a day (TID) | ORAL | Status: DC

## 2012-12-29 MED ORDER — MICONAZOLE NITRATE 2 % EX CREA: TOPICAL_CREAM | Freq: Two times a day (BID) | CUTANEOUS | Status: DC

## 2012-12-29 MED ORDER — SACCHAROMYCES BOULARDII 250 MG PO CAPS: 500.0000 mg | ORAL_CAPSULE | Freq: Two times a day (BID) | ORAL | Status: DC

## 2012-12-29 NOTE — Telephone Encounter (Signed)
S/W THE PT'S WIFE AND SHE IS AWARE OF THE APPTS ON 01/04/2013

## 2012-12-29 NOTE — Progress Notes (Signed)
Patient ID: Timothy [name], male   DOB: 1946/05/26, 66 y.o.   MRN: [id number] Subjective/Complaints: Excited to go home. No new complaints  Review of Systems   All other systems reviewed and are negative.    Objective: Vital Signs: Blood pressure 124/81, pulse 70, temperature 98.2 F (36.8 C), temperature source Oral, resp. rate 17, height 5\' 7"  (1.702 m), weight 87.7 kg (193 lb 5.5 oz), SpO2 95.00%. No results found.  Basename 12/28/12 0700 12/27/12 0440  WBC 4.2 2.7*  HGB 9.8* 8.8*  HCT 28.1* 25.2*  PLT 194 183    Basename 12/27/12 0440  NA 136  K 3.9  CL 108  GLUCOSE 96  BUN 19  CREATININE 0.63  CALCIUM 6.9*   CBG (last 3)  No results found for this basename: GLUCAP:3 in the last 72 hours  Wt Readings from Last 3 Encounters:  12/29/12 87.7 kg (193 lb 5.5 oz)  12/05/12 84 kg (185 lb 3 oz)  12/03/12 86.3 kg (190 lb 4.1 oz)    Physical Exam:  Constitutional: He is oriented to person, place, and time. He appears well-developed. pale HENT:  Head: Normocephalic and atraumatic.  Right Ear: External ear normal.  Left Ear: External ear normal.  Nose: Nose normal.  Mouth/Throat: Oropharynx is clear and moist.  Eyes: Conjunctivae normal and EOM are normal. Pupils are equal, round, and reactive to light.  Neck: Normal range of motion. Neck supple.  Cardiovascular: Normal rate, regular rhythm, normal heart sounds and intact distal pulses.  Respiratory: Effort normal and breath sounds normal. No respiratory distress. He has no wheezes. He has no rales. He exhibits no tenderness.  GI: Soft. Bowel sounds are scarce. He exhibits no distension. There is tenderness. There is no rebound and no guarding.  Genitourinary: Penis normal.  Musculoskeletal: Normal range of motion.  1+ edema bilateral pretibial 1++ bilateral feet , left more than right Neurological: He is alert and oriented to person, place, and time. He has normal reflexes. He displays no tremor. He exhibits normal  muscle tone. Gait abnormal.  Motor strength is 4/5 in bilateral deltoid, biceps, triceps, grip 4 minus/5 in bilateral hip flexors knee extensors 4/5 bilateral ankle dorsiflexor plantar flexor some pain in addition with proximal leg movements Skin: Skin is warm and dry. Color better. Psychiatric: He has a normal mood and affect. His behavior is normal. Judgment and thought content normal. More up beat as a whole   Assessment/Plan: 1. Functional deficits secondary to deconditioning related CLL recent bone marrow transplant and multiple medical conditions which require 3+ hours per day of interdisciplinary therapy in a comprehensive inpatient rehab setting. Physiatrist is providing close team supervision and 24 hour management of active medical problems listed below. Physiatrist and rehab team continue to assess barriers to discharge/monitor patient progress toward functional and medical goals.  Home today FIM: FIM - Bathing Bathing Steps Patient Completed: Chest;Right Arm;Left Arm;Abdomen;Front perineal area;Buttocks;Right upper leg;Left upper leg;Left lower leg (including foot);Right lower leg (including foot) Bathing: 6: More than reasonable amount of time  FIM - Upper Body Dressing/Undressing Upper body dressing/undressing steps patient completed: Thread/unthread right sleeve of pullover shirt/dresss;Thread/unthread left sleeve of pullover shirt/dress;Put head through opening of pull over shirt/dress;Pull shirt over trunk Upper body dressing/undressing: 7: Complete Independence: No helper FIM - Lower Body Dressing/Undressing Lower body dressing/undressing steps patient completed: Thread/unthread right underwear leg;Thread/unthread left underwear leg;Pull underwear up/down;Pull pants up/down;Thread/unthread left pants leg;Thread/unthread right pants leg Lower body dressing/undressing: 6: More than reasonable amount of time  FIM -  Toileting Toileting steps completed by patient: Adjust  clothing prior to toileting;Performs perineal hygiene;Adjust clothing after toileting Toileting Assistive Devices: Grab bar or rail for support Toileting: 6: Assistive device: No helper  FIM - Diplomatic Services operational officer Devices: Bedside commode;Walker;Grab bars Toilet Transfers: 6-Assistive device: No helper  FIM - Banker Devices: Environmental consultant;Arm rests Bed/Chair Transfer: 6: Supine > Sit: No assist;6: Sit > Supine: No assist;6: Bed > Chair or W/C: No assist;6: Chair or W/C > Bed: No assist  FIM - Locomotion: Wheelchair Distance: 150 Locomotion: Wheelchair: 6: Travels 150 ft or more, turns around, maneuvers to table, bed or toilet, negotiates 3% grade: maneuvers on rugs and over door sills independently FIM - Locomotion: Ambulation Locomotion: Ambulation Assistive Devices: Designer, industrial/product Ambulation/Gait Assistance: 6: Modified independent (Device/Increase time) Locomotion: Ambulation: 6: Travels 150 ft or more with assistive device/no helper  Comprehension Comprehension Mode: Auditory Comprehension: 5-Understands complex 90% of the time/Cues < 10% of the time  Expression Expression Mode: Verbal Expression: 6-Expresses complex ideas: With extra time/assistive device  Social Interaction Social Interaction: 6-Interacts appropriately with others with medication or extra time (anti-anxiety, antidepressant).  Problem Solving Problem Solving: 4-Solves basic 75 - 89% of the time/requires cueing 10 - 24% of the time  Memory Memory: 4-Recognizes or recalls 75 - 89% of the time/requires cueing 10 - 24% of the time Medical Problem List and Plan:  1. DVT Prophylaxis/Anticoagulation: Pharmaceutical: Lovenox  2. Pain Management: Acetaminophen  3. Mood: Zoloft ego support 4. CLL/bone marrow transplant/: mgt per oncology  -outpt follow up 5. GVHD:  Per heme onc, immunosuppresants/antifungals 6. HTN: normodyne with control at present 7.  CKD: regular BMET's, monitor I's and O's, weights. 8. Afib: rate controlled at present. Follow for activity tolerance 9. Steroid induced DM: follow CBG's, SSI as needed 10. Pancytopenia: serial CBC's. Close monitoring of clinical status, no active infection signs at present. Counts are stable to improved  -mgt per oncology recs 11. GI: diarrhea, N/V,. Stools still generally loose but showing decreased frequency and some increase in form  -maintain fluids and electrolytes-  Encouraged supplements and ample fluids at home  -  scheduled immodium, probiotic, has been evaluated by GI during this hospitalization already for diarrhea  -antiemetics, compazine  -labs are generally stable  -stools have improved somewhat but this will be a work in progress once discharged 12.  Anorexia added marinol, monitor intake  -intake improved  -his energy and appearance are both improving 13.  Osteoporosis steroid induced,  L3 fracture new since Sept, pt comfortable when sedentary, LBP with movement.  No need for vertebroplasty.     Hydrocodone is helpful   LOS (Days) 20 A FACE TO FACE EVALUATION WAS PERFORMED  SWARTZ,ZACHARY T 12/29/2012 10:45 AM

## 2012-12-29 NOTE — Progress Notes (Signed)
Pt discharged at 1450 with wife to home. Discharge instructions given by Marissa Nestle, PA with no further questions at this time.

## 2012-12-29 NOTE — Progress Notes (Signed)
Timothy [name]   DOB:February 25, 1946   ZO#:[id number]   JWJ#:[id number]  Subjective: generally feeling better, still having 3-4 semi-formed stools daily but few if any "messes." Very encouraged about d/c today  Objective: middle aged white male examined in bed Filed Vitals:   12/28/12 2037  BP: 134/71  Pulse: 78  Temp:   Resp:     Body mass index is 30.28 kg/(m^2).  Intake/Output Summary (Last 24 hours) at 12/29/12 0629 Last data filed at 12/28/12 1800  Gross per 24 hour  Intake    720 ml  Output      0 ml  Net    720 ml     Sclerae unicteric, eyelids normal  Oropharynx clear, slightly dry  No peripheral adenopathy  Lungs no rales or rhonchi  Heart regular rate and rhythm, no murmur appreciated  Abdomen soft, NT, +BS  MSK normal joint flexibility, bilateral LE edema as before  Skin: normal turgor  Neuro nonfocal, A&O x3, positive affect  Scheduled Meds:    . acyclovir  400 mg Oral BID  . antiseptic oral rinse  15 mL Mouth Rinse q12n4p  . budesonide  9 mg Oral Daily  . dronabinol  2.5 mg Oral BID AC  . enoxaparin (LOVENOX) injection  40 mg Subcutaneous Q24H  . feeding supplement (VITAL 1.5 CAL)  237 mL Oral TID WC & HS  . fluconazole  100 mg Oral q morning - 10a  . HYDROcodone-acetaminophen  1 tablet Oral Q breakfast  . labetalol  200 mg Oral BID  . loperamide  4 mg Oral BID WC  . miconazole   Topical BID  . pantoprazole  40 mg Oral Daily  . predniSONE  20 mg Oral Q breakfast  . prochlorperazine  5 mg Oral TID AC  . protein supplement  1 scoop Oral TID WC  . psyllium  1 packet Oral Daily  . saccharomyces boulardii  500 mg Oral BID  . sertraline  100 mg Oral QODAY  . sertraline  50 mg Oral QODAY  . sodium chloride  10-40 mL Intracatheter Q12H  . sulfamethoxazole-trimethoprim  1 tablet Oral 2 times weekly  . tacrolimus  1.5 mg Oral BID  . testosterone  1 patch Transdermal Daily   Continuous Infusions:    . sodium chloride 75 mL/hr at 12/27/12 1843   PRN Meds:.alum &  mag hydroxide-simeth, diphenoxylate-atropine, guaiFENesin-dextromethorphan, HYDROcodone-acetaminophen, meclizine, ondansetron, prochlorperazine, sodium chloride    CBG (last 3)  No results found for this basename: GLUCAP:3 in the last 72 hours   Labs:  Lab Results  Component Value Date   WBC 4.2 12/28/2012   HGB 9.8* 12/28/2012   HCT 28.1* 12/28/2012   MCV 86.5 12/28/2012   PLT 194 12/28/2012   NEUTROABS 0.6* 12/28/2012   Results for Timothy, [name] (MRN [id number]) as of 12/23/2012 07:35  Ref. Range 11/18/2012 16:36 11/26/2012 04:30 12/13/2012 16:25  Tacrolimus (FK506) - [facility] No range found 2.9 5.6 6.1 ng/mL    Urine Studies No results found for this basename: UACOL:2,UAPR:2,USPG:2,UPH:2,UTP:2,UGL:2,UKET:2,UBIL:2,UHGB:2,UNIT:2,UROB:2,ULEU:2,UEPI:2,UWBC:2,URBC:2,UBAC:2,CAST:2,CRYS:2,UCOM:2,BILUA:2 in the last 72 hours  Basic Metabolic Panel:  Lab 12/27/12 8469  NA 136  K 3.9  CL 108  CO2 22  GLUCOSE 96  BUN 19  CREATININE 0.63  CALCIUM 6.9*  MG 1.8  PHOS 2.0*   GFR Estimated Creatinine Clearance: 96 ml/min (by C-G formula based on Cr of 0.63). Liver Function Tests:  Lab 12/27/12 0440  AST 18  ALT 18  ALKPHOS 91  BILITOT 0.2*  PROT 3.1*  ALBUMIN 1.4*   No results found for this basename: LIPASE:5,AMYLASE:5 in the last 168 hours No results found for this basename: AMMONIA:5 in the last 168 hours Coagulation profile No results found for this basename: INR:5,PROTIME:5 in the last 168 hours  CBC:  Lab 12/28/12 0700 12/27/12 0440  WBC 4.2 2.7*  NEUTROABS 0.6* 0.6*  HGB 9.8* 8.8*  HCT 28.1* 25.2*  MCV 86.5 86.6  PLT 194 183   Cardiac Enzymes: No results found for this basename: CKTOTAL:5,CKMB:5,CKMBINDEX:5,TROPONINI:5 in the last 168 hours BNP: No components found with this basename: POCBNP:5 CBG: No results found for this basename: GLUCAP:5 in the last 168 hours D-Dimer No results found for this basename: DDIMER:2 in the last 72 hours Hgb A1c No results found  for this basename: HGBA1C:2 in the last 72 hours Lipid Profile No results found for this basename: CHOL:2,HDL:2,LDLCALC:2,TRIG:2,CHOLHDL:2,LDLDIRECT:2 in the last 72 hours Thyroid function studies No results found for this basename: TSH,T4TOTAL,FREET3,T3FREE,THYROIDAB in the last 72 hours Anemia work up No results found for this basename: VITAMINB12:2,FOLATE:2,FERRITIN:2,TIBC:2,IRON:2,RETICCTPCT:2 in the last 72 hours Microbiology No results found for this or any previous visit (from the past 240 hour(s)).    Studies:  No results found.  REHAB STATUS: 1. Functional deficits secondary to deconditioning related CLL recent bone marrow transplant and multiple medical conditions which require 3+ hours per day of interdisciplinary therapy in a comprehensive inpatient rehab setting.  Physiatrist is providing close team supervision and 24 hour management of active medical problems listed below.  Physiatrist and rehab team continue to assess barriers to discharge/monitor patient progress toward functional and medical goals.  FIM:  FIM - Bathing  Bathing Steps Patient Completed: Chest;Right Arm;Left Arm;Abdomen;Front perineal area;Buttocks;Right upper leg;Left upper leg  Bathing: 4: Min-Patient completes 8-9 66f 10 parts or 75+ percent  FIM - Upper Body Dressing/Undressing  Upper body dressing/undressing steps patient completed: Thread/unthread right sleeve of pullover shirt/dresss;Thread/unthread left sleeve of pullover shirt/dress;Put head through opening of pull over shirt/dress;Pull shirt over trunk  Upper body dressing/undressing: 5: Set-up assist to: Obtain clothing/put away  FIM - Lower Body Dressing/Undressing  Lower body dressing/undressing steps patient completed: Thread/unthread right pants leg;Thread/unthread left pants leg;Pull pants up/down  Lower body dressing/undressing: 3: Mod-Patient completed 50-74% of tasks  FIM - Toileting  Toileting steps completed by patient: Adjust clothing  prior to toileting;Performs perineal hygiene;Adjust clothing after toileting  Toileting Assistive Devices: Grab bar or rail for support  Toileting: 5: Supervision: Safety issues/verbal cues  FIM - Therapist, nutritional Devices: Grab bars;Walker  Toilet Transfers: 6-To toilet/ BSC  FIM - Biochemist, clinical Devices: TEFL teacher: 3: Sit > Supine: Mod A (lifting assist/Pt. 50-74%/lift 2 legs);5: Chair or W/C > Bed: Supervision (verbal cues/safety issues)  FIM - Locomotion: Wheelchair  Distance: 250  Locomotion: Wheelchair: 5: Travels 150 ft or more: maneuvers on rugs and over door sills with supervision, cueing or coaxing  FIM - Locomotion: Ambulation  Locomotion: Ambulation Assistive Devices: Designer, industrial/product  Ambulation/Gait Assistance: 5: Supervision  Locomotion: Ambulation: 5: Travels 150 ft or more with supervision/safety issues  Comprehension  Comprehension Mode: Auditory  Comprehension: 5-Follows basic conversation/direction: With no assist  Expression  Expression Mode: Verbal  Expression: 5-Expresses basic needs/ideas: With no assist  Social Interaction  Social Interaction: 5-Interacts appropriately 90% of the time - Needs monitoring or encouragement for participation or interaction.  Problem Solving  Problem Solving: 4-Solves basic 75 - 89% of the time/requires cueing 10 - 24%  of the time  Memory  Memory: 4-Recognizes or recalls 75 - 89% of the time/requires cueing 10 - 24% of the time  Assessment: 67 y.o. [place] man with a history of well-differentiated lymphocytic lymphoma/ chronic lymphoid leukemia initially diagnosed in 2000, not requiring intervention until 2006; with multiple chromosomal abnormalities; s/p allogeneic transplant March 2013 at "[facility]", now admitted with GVHD. His treatment history is as follows:  (1) fludarabine/cyclophosphamide/rituximab x5 completed May 2007.  (2) rituximab for 8  doses October 2010, with partial response  (3) Leustatin and ofatumumab weekly x8 July to September 2011 followed by maintenance ofatumumab maintenance ofatumumab every 2 months, with initial response but rising counts September 2012  (4) status-post unrelated donor stem-cell transplant 02/24/2012 at the [facility] in [place]  (a) conditioning regimen consisted of fludarabine + TBI at 200 cGy, followed by rituximab x27;  (b) CMV reactivation x3 (patient CMV positive, donor negative), s/p ganciclovir treatment; 3d reactivation August 2013, s/p gancyclovir, with negative PCR mid-September 2013; last gancyclovir dose 10/06/2012  (c) GVHD: involving gut and skin, treated with steroids, tacrolimus and MMF initially, MMF eventually d/c'd and tacrolimus tapered to current dose (d) atrial fibrillation: resolved on brief amiodarone regimen  (e) steroid-induced myopathy  (f) hypomagnesemia: resolved with discontinuation of gancyclovir  (g) hypogammaglobulinemia: s/p IVIG most recently 12/03/2012  (h) elevated triglycerides (606 on 07/14/2012)  (i) adrenal insufficiency: on prednisone and budesonide  (j) pancytopenia; off growth factors currently  (5) restaging studies September 2013 including CT scans, flow cytometry, and bone marrow biopsy, showed no evidence of residual chronic lymphoid leukemia.  (6) recurrent GVHD (skin rash, mouth changes, severe diarrhea and gastric/duodenal/colonic biopsies 11/17/2012 c/w GVHD grade 2) now only remaining sign is mild but bothersome diarrhea; c diff negative x3; continuing current regimen (7) severe malnutrition -- on vital in addition to regular diet (8) testosterone deficiency--on patch (9) deconditioning: ongoing REHAB  (10) mild dehydration: encouraged increased po fluids; may need IVF support intermittently  (11) steroid-induced osteoporosis with compression fractures: received pamidronate 12/18/2012 (12) nausea: improved on current meds  Plan:.Blythe is ready  for d/c today. He remains very motivated to continue PT at home. 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 I have made him an appt in our office for jan 14 at 1:45 PM, and will see him weekly thereafter. He should continue on all his current meds at discharge.  Greatly appreciate Rehab's help to this complex patient!Marland Kitchen.If there are additional issues you may contact the [facility] in [place] directly at [phone]     Lowella Dell MD 12/29/2012

## 2012-12-29 NOTE — Progress Notes (Signed)
Recreational Therapy Discharge Summary Patient Details  Name: Timothy [name] MRN: [id number] Date of Birth: 07/21/1946 Today's Date: 12/29/2012  Long term goals set: 1  Long term goals met: 1  Comments on progress toward goals: Pt has made excellent progress and is ready for discharge home at Mod I level, needing extra time and use of rollator walker for safe mobility/completion of task.  Pt independently recognizes need for rest. Reasons for discharge: discharge from hospital Patient/family agrees with progress made and goals achieved: Yes  [name] 12/29/2012, 8:22 AM

## 2012-12-29 NOTE — Progress Notes (Signed)
Social Work  Discharge Note  The overall goal for the admission was met for:   Discharge location: Yes - home with wife  Length of Stay: Yes - 20 days  Discharge activity level: Yes - supervision to mod i overall  Home/community participation: Yes  Services provided included: MD, RD, PT, OT, RN, TR, Pharmacy, Neuropsych and SW  Financial Services: Medicare and Private Insurance: [employer] (primary)  Follow-up services arranged: Home Health: RN, PT via [facility] and Patient/Family has no preference for HH/DME agencies  Comments (or additional information):  Patient/Family verbalized understanding of follow-up arrangements: Yes  Individual responsible for coordination of the follow-up plan: patient  Confirmed correct DME delivered: NA  Bryahna Lesko

## 2013-01-04 ENCOUNTER — Ambulatory Visit (HOSPITAL_BASED_OUTPATIENT_CLINIC_OR_DEPARTMENT_OTHER): Payer: BC Managed Care – PPO | Admitting: Physician Assistant

## 2013-01-04 ENCOUNTER — Other Ambulatory Visit (HOSPITAL_BASED_OUTPATIENT_CLINIC_OR_DEPARTMENT_OTHER): Payer: BC Managed Care – PPO | Admitting: Lab

## 2013-01-04 ENCOUNTER — Encounter: Payer: Self-pay | Admitting: Physician Assistant

## 2013-01-04 ENCOUNTER — Other Ambulatory Visit: Payer: Self-pay | Admitting: *Deleted

## 2013-01-04 ENCOUNTER — Ambulatory Visit (HOSPITAL_BASED_OUTPATIENT_CLINIC_OR_DEPARTMENT_OTHER): Payer: BC Managed Care – PPO

## 2013-01-04 VITALS — BP 111/68 | HR 80 | Temp 98.4°F | Resp 20 | Ht 67.0 in | Wt 190.5 lb

## 2013-01-04 DIAGNOSIS — K449 Diaphragmatic hernia without obstruction or gangrene: Secondary | ICD-10-CM

## 2013-01-04 DIAGNOSIS — T8609 Other complications of bone marrow transplant: Secondary | ICD-10-CM

## 2013-01-04 DIAGNOSIS — D89811 Chronic graft-versus-host disease: Secondary | ICD-10-CM

## 2013-01-04 DIAGNOSIS — C9110 Chronic lymphocytic leukemia of B-cell type not having achieved remission: Secondary | ICD-10-CM

## 2013-01-04 DIAGNOSIS — I10 Essential (primary) hypertension: Secondary | ICD-10-CM

## 2013-01-04 DIAGNOSIS — R197 Diarrhea, unspecified: Secondary | ICD-10-CM

## 2013-01-04 DIAGNOSIS — Z9489 Other transplanted organ and tissue status: Secondary | ICD-10-CM

## 2013-01-04 DIAGNOSIS — E860 Dehydration: Secondary | ICD-10-CM

## 2013-01-04 DIAGNOSIS — E43 Unspecified severe protein-calorie malnutrition: Secondary | ICD-10-CM

## 2013-01-04 LAB — CBC WITH DIFFERENTIAL/PLATELET
Eosinophils Absolute: 0 10*3/uL (ref 0.0–0.5)
LYMPH%: 21.4 % (ref 14.0–49.0)
MCHC: 33.7 g/dL (ref 32.0–36.0)
MCV: 88.3 fL (ref 79.3–98.0)
MONO%: 4.1 % (ref 0.0–14.0)
NEUT#: 4.9 10*3/uL (ref 1.5–6.5)
Platelets: 193 10*3/uL (ref 140–400)
RBC: 3.5 10*6/uL — ABNORMAL LOW (ref 4.20–5.82)
nRBC: 0 % (ref 0–0)

## 2013-01-04 LAB — COMPREHENSIVE METABOLIC PANEL (CC13)
ALT: 19 U/L (ref 0–55)
AST: 20 U/L (ref 5–34)
Albumin: 1.7 g/dL — ABNORMAL LOW (ref 3.5–5.0)
Alkaline Phosphatase: 132 U/L (ref 40–150)
BUN: 21 mg/dL (ref 7.0–26.0)
CO2: 22 mEq/L (ref 22–29)
Calcium: 7.5 mg/dL — ABNORMAL LOW (ref 8.4–10.4)
Chloride: 106 mEq/L (ref 98–107)
Creatinine: 0.8 mg/dL (ref 0.7–1.3)
Glucose: 127 mg/dl — ABNORMAL HIGH (ref 70–99)
Potassium: 4.1 mEq/L (ref 3.5–5.1)
Sodium: 135 mEq/L — ABNORMAL LOW (ref 136–145)
Total Bilirubin: 0.27 mg/dL (ref 0.20–1.20)
Total Protein: 3.9 g/dL — ABNORMAL LOW (ref 6.4–8.3)

## 2013-01-04 LAB — MAGNESIUM (CC13): Magnesium: 1.6 mg/dl (ref 1.5–2.5)

## 2013-01-04 LAB — LACTATE DEHYDROGENASE (CC13): LDH: 232 U/L (ref 125–245)

## 2013-01-04 MED ORDER — FLUCONAZOLE 100 MG PO TABS: 100.0000 mg | ORAL_TABLET | Freq: Every morning | ORAL | Status: DC

## 2013-01-04 MED ORDER — SODIUM CHLORIDE 0.9 % IV SOLN
1000.0000 mL | INTRAVENOUS | Status: DC
  Administered 2013-01-04: 16:00:00 via INTRAVENOUS

## 2013-01-04 MED ORDER — DIPHENOXYLATE-ATROPINE 2.5-0.025 MG PO TABS: 1.0000 | ORAL_TABLET | Freq: Four times a day (QID) | ORAL | Status: DC | PRN

## 2013-01-04 MED ORDER — BUDESONIDE 3 MG PO CP24: 9.0000 mg | ORAL_CAPSULE | Freq: Every morning | ORAL | Status: DC

## 2013-01-04 NOTE — Progress Notes (Signed)
1555 Per NP, changed dressing to Left forearm with abrasion. Cleaned abrasion with normal saline, new vaseline gauze with 4x4 gauze placed on top with stretch net placed over dressing. Pt advised to remove vaseline gauze after twenty-four hours, so as to not dry over wound. Pt expressed verbal understanding.

## 2013-01-04 NOTE — Progress Notes (Signed)
ID: Timothy [name]   DOB: Mar 13, 1946  MR#: [id number]  CSN#:[id number]  HISTORY OF PRESENT ILLNESS: We have very complete records from Timothy. Sydnee [name] in [place], and in summary:  The patient was initially diagnosed in August 2000, with a white cell count of 23,600, but normal hemoglobin and platelets, and no significant symptomatology. Over the next several years his white cell count drifted up, and he eventually developed some symptoms of night sweats in particular, leading to treatment with fludarabine, Cytoxan and rituxan for five cycles given between December 2006 and May 2007.  We have CT scans from June 2006, November 2006 and April 2007, and comparing the November 2006 and April 2007 scans, there was near complete response. He had subsequent therapy in [place] as detailed below, but with decreased response, leading to allogeneic stem-cell transplant at the [facility] 02/24/2012.  INTERVAL HISTORY: Timothy [name] returns with his wife Timothy [name]  for followup of his CLL, status post allogeneic stem cell transplant in [place] March 2013.   Interval history is remarkable for recent hospitalization followed by a rehabilitation, discharged 12/29/2012. Tearing continues to receive physical therapy at home. He is gradually feeling stronger, but the improvement is low. He continues to have loose stools, 3-5 times daily. These are no longer watery, but are more "mushy". He takes Lomotil as needed. He still has some anorexia although this has improved slightly with the addition of Marinol. Regardless, he has lost another 3 pounds over the past week since his discharge.     REVIEW OF SYSTEMS: Timothy [name] continues to have fatigue. He denies any fevers or chills. He has no new rashes, and the current skin changes on his face are improving. He's had no additional oral ulcerations or oral sensitivity, and has no problems swallowing. He denies signs of abnormal bleeding. He has had no recent nausea and no  emesis, and in fact has needed no antinausea medications since his discharge. He denies any cough, phlegm production, or increased shortness of breath and has had no chest pain. He denies any abnormal headaches. He has mild pain which is treated very effectively with half to one tablet of hydrocodone/APAP each morning. He's having chronic swelling in his lower extremities, which improved slightly with elevation.  A detailed review of systems is otherwise stable and noncontributory.   PAST MEDICAL HISTORY: Past Medical History  Diagnosis Date  . 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 Diverticular disease   . Hyperlipidemia   . Obesity   . Hypertension   . Hiatal hernia   . CMV (cytomegalovirus) antibody positive     pre-transplant, with seroconversion x2 pst-transplant  . Right bundle branch block     pre-transplant  . CKD (chronic kidney disease) stage 2, GFR 60-89 ml/min   . Pancytopenia   . Steroid-induced diabetes   . Atrial fibrillation     post-transplant  . Myopathy   . 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 Leukemia, chronic lymphoid   . 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 Diarrhea in adult patient 12/05/2012    Due to active GVHD  . CLL (chronic lymphocytic leukemia) 12/05/2012    Dx 07/1999; started Rx 12/06  AlloBMT 3/13  . Rash of face 12/05/2012    Due to GVHD    PAST SURGICAL HISTORY: Past Surgical History  Procedure Date  . Tonsillectomy and adenoidectomy   . Bone marrow transplant   .  Flexible sigmoidoscopy 11/17/2012    Procedure: FLEXIBLE SIGMOIDOSCOPY;  Surgeon: Timothy Kuba, MD;  Location: [facility];  Service: Endoscopy;  Laterality: N/A;  Timothy [facility] [name] 11/16/12  . Esophagogastroduodenoscopy 11/17/2012    Procedure: ESOPHAGOGASTRODUODENOSCOPY (EGD);  Surgeon: Timothy Kuba, MD;  Location: [name];   Service: Endoscopy;  Laterality: N/A;    FAMILY HISTORY Family History  Problem Relation Age of Onset  . Cancer Father    The patient's father died from complications of chronic lymphocytic leukemia at the age of 43.  It had been diagnosed seven years before when he was 30.  The patient is enrolled in a familial chronic lymphocytic leukemia study out of the [name].  The patient's mother is 27, alive, unfortunately suffering with dementia, and he has a brother, 75, who is otherwise in fair health.   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  His wife of >40 years, Timothy [name], is a homemaker.  Their daughter, Timothy [name], lives in [place].  She also is a Futures trader.  The patient has an 64 year old grandson and an 15-year-old granddaughter, and that is really the main reason he moved to this area.  He is a International aid/development worker.     ADVANCED DIRECTIVES:  HEALTH MAINTENANCE: History  Substance Use Topics  . Smoking status: Never Smoker   . Smokeless tobacco: Never Used  . Alcohol Use: No     Colonoscopy:  PAP:  Bone density:  Lipid panel:  Allergies  Allergen Reactions  . Benadryl (Diphenhydramine Hcl)     "Restless leg syndrome"    Current Outpatient Prescriptions  Medication Sig Dispense Refill  . acyclovir (ZOVIRAX) 400 MG tablet Take 1 tablet (400 mg total) by mouth 2 (two) times daily.  60 tablet  2  . antiseptic oral rinse (BIOTENE) LIQD 15 mLs by Mouth Rinse route 2 times daily at 12 noon and 4 pm.  237 mL  6  . budesonide (ENTOCORT EC) 3 MG 24 hr capsule Take 3 capsules (9 mg total) by mouth every morning.  90 capsule  6  . Calcium Citrate (CITRACAL PO) Take 1,200 mg by mouth 2 (two) times daily.      . cholecalciferol (VITAMIN D) 1000 UNITS tablet Take 1,000 Units by mouth every evening.       . diphenoxylate-atropine (LOMOTIL) 2.5-0.025 MG per tablet  Take 1 tablet by mouth 4 (four) times daily as needed for diarrhea or loose stools.  30 tablet  1  . dronabinol (MARINOL) 2.5 MG capsule Take 1 capsule (2.5 mg total) by mouth 2 (two) times daily before lunch and supper.  60 capsule  0  . fluconazole (DIFLUCAN) 100 MG tablet Take 1 tablet (100 mg total) by mouth every morning.  30 tablet  0  . HYDROcodone-acetaminophen (NORCO) 10-325 MG per tablet Take 1 tablet by mouth daily with breakfast.  30 tablet  0  . labetalol (NORMODYNE) 200 MG tablet Take 1 tablet (200 mg total) by mouth 2 (two) times daily.  60 tablet  12  . Multiple Vitamins-Minerals (CENTRUM SILVER ADULT 50+) TABS Take 1 tablet by mouth every evening.       . Nutritional Supplements (FEEDING SUPPLEMENT, VITAL 1.5 CAL,) LIQD Take 237 mLs by mouth 2 (two) times daily.  60 Can  6  . omeprazole (PRILOSEC) 40 MG capsule Take 40 mg by  mouth daily.      . predniSONE (DELTASONE) 20 MG tablet Take 1 tablet (20 mg total) by mouth daily with breakfast.  30 tablet  1  . protein supplement (RESOURCE BENEPROTEIN) POWD Take 6 g by mouth 3 (three) times daily with meals.      . psyllium (HYDROCIL/METAMUCIL) 95 % PACK Take 1 packet by mouth daily.  56 each    . saccharomyces boulardii (FLORASTOR) 250 MG capsule Take 2 capsules (500 mg total) by mouth 2 (two) times daily. Probiotic  120 capsule  1  . sertraline (ZOLOFT) 50 MG tablet Take 50-100 mg by mouth daily. Takes 50 mg one day and 100 mg the next day      . sulfamethoxazole-trimethoprim (BACTRIM DS) 800-160 MG per tablet Take 1 tablet by mouth 2 (two) times a week. Mondays and Tuesdays, twice daily      . tacrolimus (PROGRAF) 0.5 MG capsule Take 1.5 mg by mouth 2 (two) times daily.      Marland Kitchen testosterone (ANDRODERM) 4 MG/24HR PT24 patch Place 1 patch onto the skin daily.  30 patch  3  . meclizine (ANTIVERT) 25 MG tablet Take 25 mg by mouth Twice daily as needed. For dizziness      . miconazole (MICOTIN) 2 % cream Apply topically 2 (two) times daily.   30 g  1  . ondansetron (ZOFRAN-ODT) 8 MG disintegrating tablet Take 8 mg by mouth 2 (two) times daily as needed. For nausea      . prochlorperazine (COMPAZINE) 10 MG tablet Take 10 mg by mouth every 6 (six) hours as needed. Nausea       No current facility-administered medications for this visit.   Facility-Administered Medications Ordered in Other Visits  Medication Dose Route Frequency Provider Last Rate Last Dose  . 0.9 %  sodium chloride infusion  1,000 mL Intravenous Continuous Catalina Gravel, PA 500 mL/hr at 01/04/13 1540    . sodium chloride 0.9 % injection 10 mL  10 mL Intravenous PRN Lowella Dell, MD   10 mL at 08/11/12 1606    OBJECTIVE: Middle-aged white male who appears fatigued but comfortable, and in no acute distress Filed Vitals:   01/04/13 1453  BP: 111/68  Pulse: 80  Temp: 98.4 F (36.9 C)  Resp: 20     Body mass index is 29.84 kg/(m^2).    Filed Weights   01/04/13 1453  Weight: 190 lb 8 oz (86.41 kg)   ECOG FS: 2  Sclerae unicteric  Oropharynx clear with no lesions or thrush No cervical or supraclavicular adenopathy  Lungs clear to auscultation,  no rales or rhonchi  Heart regular rate and rhythm, no murmur appreciated Abd  +BS but hypoactive, soft, slightly tender to palpation diffusely, no guarding or rebound  MSK   2+ bilateral ankle edema, equal bilaterally. No upper extremity edema noted Neuro: nonfocal, alert and oriented x 3, positive affect.   LAB RESULTS:  Today's labs are pending, 01/04/2013.  CMV   Negative  11/04/2012   Negative  10/29/2012  Negative  10/21/2012  Negative  10/14/2012  Negative  10/06/2012  Negative  09/29/2012  Negative  09/24/2012  Negative   09/15/2012  Positive, <200  09/08/2012   Lab Results  Component Value Date   WBC 6.6 01/04/2013   NEUTROABS 4.9 01/04/2013   HGB 10.4* 01/04/2013   HCT 30.9* 01/04/2013   MCV 88.3 01/04/2013   PLT 193 01/04/2013      Chemistry  Component Value Date/Time   NA 136  12/27/2012 0440   NA 131* 11/10/2012 1410   K 3.9 12/27/2012 0440   K 4.6 11/10/2012 1410   CL 108 12/27/2012 0440   CL 104 11/10/2012 1410   CO2 22 12/27/2012 0440   CO2 22 11/10/2012 1410   BUN 19 12/27/2012 0440   BUN 20.0 11/10/2012 1410   CREATININE 0.63 12/27/2012 0440   CREATININE 1.1 11/10/2012 1410      Component Value Date/Time   CALCIUM 6.9* 12/27/2012 0440   CALCIUM 8.9 11/10/2012 1410   ALKPHOS 91 12/27/2012 0440   ALKPHOS 67 11/10/2012 1410   AST 18 12/27/2012 0440   AST 44* 11/10/2012 1410   ALT 18 12/27/2012 0440   ALT 39 11/10/2012 1410   BILITOT 0.2* 12/27/2012 0440   BILITOT 0.35 11/10/2012 1410     IgG:  816 11/04/2012  LDH: 123 11/01/2012   Lipid Panel     Component Value Date/Time   CHOL 225* 08/20/2012 1341   TRIG 301* 08/20/2012 1341   HDL 23* 08/20/2012 1341   CHOLHDL 9.8 08/20/2012 1341   VLDL 60* 08/20/2012 1341   LDLCALC 142* 08/20/2012 1341    Magnesium was 1.8 on 12/27/2012.  Phosphorus was 2.0 on 12/27/2012.  Results for KASEN, ADDUCI (MRN [id number]) as of 10/14/2012 17:26  Ref. Range 07/21/2012 10:20 07/28/2012 15:22 08/04/2012 14:45 08/06/2012 15:10 08/11/2012 09:17  Tacrolimus Lvl Latest Range: 5.0-20.0 ng/mL 3.9 (L) 9.7 9.0 6.9 6.9  12/27/2012 21.2  STUDIES:  No recent studies.   ASSESSMENT: 67 y.o. [place] man with a history of well-differentiated lymphocytic lymphoma/ chronic lymphoid leukemia initially diagnosed in 2000, not requiring intervention until 2006; with multiple chromosomal abnormalities.  His treatment history is as follows:  (1) fludarabine/cyclophosphamide/rituximab x5 completed May 2007.   (2) rituximab for 8 doses October 2010, with partial response   (3) [name] and ofatumumab weekly x8 July to September 2011 followed by maintenance ofatumumab maintenance ofatumumab every 2 months, with initial response but rising counts September 2012   (4) status-post unrelated donor stem-cell transplant 02/24/2012 at the [facility]  (a)  conditioning regimen consisted of fludarabine + TBI at 200 cGy, followed by rituximab x27;  (b) CMV reactivation x3 (patient CMV positive, donor negative), s/p ganciclovir treatment; 3d reactivation August 2013, s/p gancyclovir, with negative PCR mid-September 2013; last gancyclovir dose 10/06/2012 (c) Chronic GVHD: involving gut and skin, treated with steroids, tacrolimus and MMF.  MMF was eventually d/c'd and tacrolimus currently at a dose of 1.5mg  BID (d) atrial fibrillation: resolved on brief amiodarone regimen (e) steroid-induced myopathy: improving  (f) hypomagnesemia: resolved after d/c gancyclovir (g) hypogammaglobulinemia: s/p IVIG most recently 12/03/2012 (h) elevated triglycerides (606 on 07/14/2012)  (i) adrenal insufficiency: on prednisone and budesonide (j) pancytopenia, improved  (5) restaging studies September 2013  including CT scans, flow cytometry, and bone marrow biopsy, showed no evidence of residual chronic lymphoid leukemia.  (6) recurrent GVHD (skin rash, mouth changes, severe diarrhea and gastric/duodenal/colonic biopsies 11/17/2012 c/w GVHD grade 2) now only remaining sign is mild but bothersome diarrhea; c diff negative x3; continuing current regimen   (7) severe malnutrition -- on VITAL supplement in addition to regular diet; on Marinol for anorexia  (8) testosterone deficiency--on patch   (9) deconditioning: ongoing REHAB   (10) mild dehydration: encouraged increased po fluids; may need IVF support intermittently   (11) steroid-induced osteoporosis with compression fractures: received pamidronate 12/18/2012   (12) nausea: improved on current meds   PLAN: This case  was reviewed with Timothy. Darnelle Catalan who also spoke with Timothy [name] and Timothy [name] today.  Timothy [name] will receive supportive IV fluids today.  We will plan on seeing him for labs, follow up and supportive fluids on a weekly basis for the next several weeks.  If he finds he is unable to keep himself well hydrated, we  can also consider IV fluids at home through home health.    Timothy [name] will continue on all of his current medications, including the prednisone at 20 mg daily. He'll take Lomotil as needed.  He'll also continue to receive physical therapy at home which is proving to be very beneficial.  Timothy [name] is scheduled to be seen in [place] on 02/08/13.  Timothy [name] and Timothy [name] both voice understanding and agreement with this plan and will call with any changes or problems.  Timothy [name]    01/04/2013

## 2013-01-04 NOTE — Patient Instructions (Addendum)
Dehydration, Adult Dehydration is when you lose more fluids from the body than you take in. Vital organs like the kidneys, brain, and heart cannot function without a proper amount of fluids and salt. Any loss of fluids from the body can cause dehydration.  CAUSES   Vomiting.  Diarrhea.  Excessive sweating.  Excessive urine output.  Fever. SYMPTOMS  Mild dehydration  Thirst.  Dry lips.  Slightly dry mouth. Moderate dehydration  Very dry mouth.  Sunken eyes.  Skin does not bounce back quickly when lightly pinched and released.  Dark urine and decreased urine production.  Decreased tear production.  Headache. Severe dehydration  Very dry mouth.  Extreme thirst.  Rapid, weak pulse (more than 100 beats per minute at rest).  Cold hands and feet.  Not able to sweat in spite of heat and temperature.  Rapid breathing.  Blue lips.  Confusion and lethargy.  Difficulty being awakened.  Minimal urine production.  No tears. DIAGNOSIS  Your caregiver will diagnose dehydration based on your symptoms and your exam. Blood and urine tests will help confirm the diagnosis. The diagnostic evaluation should also identify the cause of dehydration. TREATMENT  Treatment of mild or moderate dehydration can often be done at home by increasing the amount of fluids that you drink. It is best to drink small amounts of fluid more often. Drinking too much at one time can make vomiting worse. Refer to the home care instructions below. Severe dehydration needs to be treated at the hospital where you will probably be given intravenous (IV) fluids that contain water and electrolytes. HOME CARE INSTRUCTIONS   Ask your caregiver about specific rehydration instructions.  Drink enough fluids to keep your urine clear or pale yellow.  Drink small amounts frequently if you have nausea and vomiting.  Eat as you normally do.  Avoid:  Foods or drinks high in sugar.  Carbonated  drinks.  Juice.  Extremely hot or cold fluids.  Drinks with caffeine.  Fatty, greasy foods.  Alcohol.  Tobacco.  Overeating.  Gelatin desserts.  Wash your hands well to avoid spreading bacteria and viruses.  Only take over-the-counter or prescription medicines for pain, discomfort, or fever as directed by your caregiver.  Ask your caregiver if you should continue all prescribed and over-the-counter medicines.  Keep all follow-up appointments with your caregiver. SEEK MEDICAL CARE IF:  You have abdominal pain and it increases or stays in one area (localizes).  You have a rash, stiff neck, or severe headache.  You are irritable, sleepy, or difficult to awaken.  You are weak, dizzy, or extremely thirsty. SEEK IMMEDIATE MEDICAL CARE IF:   You are unable to keep fluids down or you get worse despite treatment.  You have frequent episodes of vomiting or diarrhea.  You have blood or green matter (bile) in your vomit.  You have blood in your stool or your stool looks black and tarry.  You have not urinated in 6 to 8 hours, or you have only urinated a small amount of very dark urine.  You have a fever.  You faint. MAKE SURE YOU:   Understand these instructions.  Will watch your condition.  Will get help right away if you are not doing well or get worse. Document Released: 12/08/2005 Document Revised: 03/01/2012 Document Reviewed: 07/28/2011 Exit[employer] Patient Information 2013 [employer].  

## 2013-01-05 ENCOUNTER — Telehealth: Payer: Self-pay | Admitting: *Deleted

## 2013-01-05 ENCOUNTER — Other Ambulatory Visit: Payer: Self-pay | Admitting: *Deleted

## 2013-01-05 LAB — PHOSPHORUS: Phosphorus: 3.2 mg/dL (ref 2.3–4.6)

## 2013-01-05 LAB — TACROLIMUS LEVEL: Tacrolimus Lvl: 8.1 ng/mL (ref 5.0–20.0)

## 2013-01-05 LAB — IGG, IGA, IGM: IgA: <6 mg/dL — ABNORMAL LOW (ref 68–379)

## 2013-01-05 NOTE — Telephone Encounter (Signed)
Per staff message and POF I have scheduled appts.  JMW  

## 2013-01-05 NOTE — Telephone Encounter (Signed)
lab, AB and 2 hr infusion for IV fluids 1/21, 1/28, and 2/5; lab, GM and 2 hr infusion 2/11  Sent michelle email to set up patient treatment

## 2013-01-06 ENCOUNTER — Other Ambulatory Visit: Payer: Self-pay | Admitting: Physician Assistant

## 2013-01-06 ENCOUNTER — Telehealth: Payer: Self-pay | Admitting: Oncology

## 2013-01-06 ENCOUNTER — Other Ambulatory Visit: Payer: Self-pay | Admitting: *Deleted

## 2013-01-06 ENCOUNTER — Ambulatory Visit (HOSPITAL_BASED_OUTPATIENT_CLINIC_OR_DEPARTMENT_OTHER): Payer: BC Managed Care – PPO

## 2013-01-06 VITALS — BP 129/74 | HR 71 | Temp 98.5°F

## 2013-01-06 DIAGNOSIS — R197 Diarrhea, unspecified: Secondary | ICD-10-CM

## 2013-01-06 DIAGNOSIS — E860 Dehydration: Secondary | ICD-10-CM

## 2013-01-06 DIAGNOSIS — D89811 Chronic graft-versus-host disease: Secondary | ICD-10-CM

## 2013-01-06 DIAGNOSIS — C9110 Chronic lymphocytic leukemia of B-cell type not having achieved remission: Secondary | ICD-10-CM

## 2013-01-06 MED ORDER — HEPARIN SOD (PORK) LOCK FLUSH 100 UNIT/ML IV SOLN
500.0000 [IU] | Freq: Once | INTRAVENOUS | Status: AC
  Administered 2013-01-06: 250 [IU] via INTRAVENOUS
  Filled 2013-01-06: qty 5

## 2013-01-06 MED ORDER — SODIUM CHLORIDE 0.9 % IV SOLN
1000.0000 mL | INTRAVENOUS | Status: DC
  Administered 2013-01-06: 1000 mL via INTRAVENOUS

## 2013-01-06 MED ORDER — SODIUM CHLORIDE 0.9 % IJ SOLN
10.0000 mL | INTRAMUSCULAR | Status: DC | PRN
  Administered 2013-01-06: 10 mL via INTRAVENOUS
  Filled 2013-01-06: qty 10

## 2013-01-06 NOTE — Telephone Encounter (Signed)
Faxed pt medical records to [facility]

## 2013-01-06 NOTE — Patient Instructions (Signed)
Dehydration, Adult Dehydration is when you lose more fluids from the body than you take in. Vital organs like the kidneys, brain, and heart cannot function without a proper amount of fluids and salt. Any loss of fluids from the body can cause dehydration.  CAUSES   Vomiting.  Diarrhea.  Excessive sweating.  Excessive urine output.  Fever. SYMPTOMS  Mild dehydration  Thirst.  Dry lips.  Slightly dry mouth. Moderate dehydration  Very dry mouth.  Sunken eyes.  Skin does not bounce back quickly when lightly pinched and released.  Dark urine and decreased urine production.  Decreased tear production.  Headache. Severe dehydration  Very dry mouth.  Extreme thirst.  Rapid, weak pulse (more than 100 beats per minute at rest).  Cold hands and feet.  Not able to sweat in spite of heat and temperature.  Rapid breathing.  Blue lips.  Confusion and lethargy.  Difficulty being awakened.  Minimal urine production.  No tears. DIAGNOSIS  Your caregiver will diagnose dehydration based on your symptoms and your exam. Blood and urine tests will help confirm the diagnosis. The diagnostic evaluation should also identify the cause of dehydration. TREATMENT  Treatment of mild or moderate dehydration can often be done at home by increasing the amount of fluids that you drink. It is best to drink small amounts of fluid more often. Drinking too much at one time can make vomiting worse. Refer to the home care instructions below. Severe dehydration needs to be treated at the hospital where you will probably be given intravenous (IV) fluids that contain water and electrolytes. HOME CARE INSTRUCTIONS   Ask your caregiver about specific rehydration instructions.  Drink enough fluids to keep your urine clear or pale yellow.  Drink small amounts frequently if you have nausea and vomiting.  Eat as you normally do.  Avoid:  Foods or drinks high in sugar.  Carbonated  drinks.  Juice.  Extremely hot or cold fluids.  Drinks with caffeine.  Fatty, greasy foods.  Alcohol.  Tobacco.  Overeating.  Gelatin desserts.  Wash your hands well to avoid spreading bacteria and viruses.  Only take over-the-counter or prescription medicines for pain, discomfort, or fever as directed by your caregiver.  Ask your caregiver if you should continue all prescribed and over-the-counter medicines.  Keep all follow-up appointments with your caregiver. SEEK MEDICAL CARE IF:  You have abdominal pain and it increases or stays in one area (localizes).  You have a rash, stiff neck, or severe headache.  You are irritable, sleepy, or difficult to awaken.  You are weak, dizzy, or extremely thirsty. SEEK IMMEDIATE MEDICAL CARE IF:   You are unable to keep fluids down or you get worse despite treatment.  You have frequent episodes of vomiting or diarrhea.  You have blood or green matter (bile) in your vomit.  You have blood in your stool or your stool looks black and tarry.  You have not urinated in 6 to 8 hours, or you have only urinated a small amount of very dark urine.  You have a fever.  You faint. MAKE SURE YOU:   Understand these instructions.  Will watch your condition.  Will get help right away if you are not doing well or get worse. Document Released: 12/08/2005 Document Revised: 03/01/2012 Document Reviewed: 07/28/2011 Exit[employer] Patient Information 2013 [employer].  

## 2013-01-06 NOTE — Discharge Summary (Signed)
NAMEQUINTEL, [name] NO.:  [id number]  MEDICAL RECORD NO.:  [id number]  LOCATION:  4149                         FACILITY:  [facility]  PHYSICIAN:  Ranelle Oyster, M.D.DATE OF BIRTH:  1946/08/06  DATE OF ADMISSION:  12/09/2012 DATE OF DISCHARGE:  12/29/2012                              DISCHARGE SUMMARY   DISCHARGE DIAGNOSES: 1. Deconditioning due to chronic lymphocytic leukemia with multiple     medical issues. 2. Anorexia. 3. 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 4. History of steroid-induced diabetes mellitus. 5. Atrial fibrillation. 6. Chronic kidney disease. 7. Pancytopenia. 8. Osteoporosis.  HISTORY OF PRESENT ILLNESS:  [name] is a 67 year old male with history of CLL, status post allogeneic stem cell transplant, reactivation of CMV, who has had ongoing problems with diarrhea and weight loss.  Recent workup was consistent with graft-versus-host disease as well as active gastritis and mild distal esophagitis.  He was admitted to [facility] on December 05, 2012, with recurrent diarrhea, was treated with hydration and short course of Solu-Medrol.  Therapy evaluation was done and CIR was recommended.  The patient admitted to Rehab on December 19, for progressive therapies.  PAST MEDICAL HISTORY:  Obesity, hypertension, hiatal hernia, CMV antibody positive, right bundle-branch block, chronic kidney disease stage II, pancytopenia, steroid-induced diabetes, AFib, myopathy, fine tremors, CLL, chronic graft-versus-host disease.  FUNCTIONAL HISTORY:  The patient was independent, but has been sedentary for the past few weeks due to multiple ongoing medical issues.  FUNCTIONAL STATUS:  The patient was min assist for bed mobility, min assist transfers, min assist ambulating 300 feet with rolling walker, noted to have lower extremity edema with decreased posture.  He required min assist for upper body bathing and dressing tasks, min assist  for lower body bathing, max assist for lower body dressing.  LABORATORY DATA:  Recent labs; check of lytes from January 6 revealed sodium 136, potassium 3.9, chloride 108, CO2 of 22, BUN 19, creatinine 0.63, glucose 96.  CBC of January 7 revealed hemoglobin 9.8, hematocrit 28.1, white count 4.2, platelets 194.  HOSPITAL COURSE:  [name] was admitted to Rehab on December 09, 2012, for inpatient therapies to consist of PT, OT at least 3 hours 5 days a week.  Past admission, physiatrist, rehab, RN, and therapy team have worked together to provide customized collaborative interdisciplinary care.  Rehab RN has worked with patient on skin care monitoring as well as bowel and bladder issues.  The patient was noted to have poor p.o. intake impart due to his concerns of diarrhea which was worse past meals.  He was started on  with IV fluids due to worsening of renal insufficiency.  Probiotics as well as antidiarrheals were added to help patient with his symptoms.  He was also started on Marinol and  This has helped his anorexia.  Routine labs have been done during this stay.  Renal status had improved by time of discharge.  CBC shows platelets, white count, as well as H and H to be stable.  Tacrolimus level of January 6 is at 21.2.  His fasting blood sugars have ranged from 90s to 120s range.  He has had some low back  pain due to chronic L3 fracture and p.r.n. hydrocodone has been effective for pain management.  During the patient's stay in rehab, weekly team conferences were held to monitor the patient's progress, set goals, as well as discuss barriers to discharge.  The patient had shown improvement in balance, strength as well as decrease in pain.  He was modified independent for transfers and mobility.  He required min assist to navigate stairs.  OT has worked with the patient on self-care tasks.  He did require assistance to don Ace wraps on lower extremities.  He was using  Adaptic equipment appropriately for lower body care.  He made steady progress with bathing, dressing, toileting tasks.  He was modified independent for all BADLs.  He does require multiple rest breaks to complete his ADL tasks. Further followup home health therapies to continue past discharge.  On December 29, 2012, the patient is discharged to home in improved condition.  DISCHARGE MEDICATIONS: 1. Marinol 2.5 mg p.o. b.i.d. 2. Norco 10-325 one p.o. q.a.m. 3. Micatin to inguinal areas b.i.d. 4. Protein supplements t.i.d. with meals. 5. Metamucil 1 package in 8 ounces p.o. per day. 6. Florastor 500 mg p.o. b.i.d. 7. Prednisone 20 mg p.o. per day. 8. Zovirax 400 mg p.o. b.i.d. 9. Centrum Silver 1 p.o. per day. 10.Vitamin D 1000 units per day. 11.Citracal 1200 mg p.o. b.i.d. 12.Vital 1.5 mL 1 can p.o. b.i.d. 13.Normodyne 200 mg p.o. b.i.d. 14.Meclizine 25 mg b.i.d. p.r.n. dizziness. 15.Zofran 8 mg p.o. b.i.d. p.r.n. nausea. 16.Prograf 1.5 mg p.o. b.i.d. 17.Septra DS 1 p.o. b.i.d. on Mondays and Tuesdays. 18.Zoloft 50 mg alternating with 100 mg every other day. 19.Compazine 10 mg p.o. q.6 h. p.r.n. nausea. 20.Androgen patch 4 mcg q.24 h. change daily.  DIET:  Regular as tolerated.  ACTIVITY LEVEL:  Needs intermittent supervision.  SPECIAL INSTRUCTIONS:  Walk with walker.  No alcohol.  [facility] to provide PT and RN.  Routine [facility] care.  Do not use lisinopril or Naprosyn.  FOLLOWUP:  Call Dr. Darrall Dears office for followup appointment.  Dr. Alver Fisher office to call you for post hospital appointment.  Follow up with Dr. Riley Kill as needed.     Delle Reining, P.A.   ______________________________ Ranelle Oyster, M.D.    PL/MEDQ  D:  01/06/2013  T:  01/06/2013  Job:  [id number]  cc:   Lowella Dell, M.D. Kari Baars, M.D.

## 2013-01-06 NOTE — Progress Notes (Signed)
This RN called pt per MD review of lab with need for IVIG.  Spoke with wife,Paula, who verbalized understanding appointment will be scheduled and they will be contacted.  Message left on VM of chemo scheduler.  Electronic schedule request sent to scheduling.

## 2013-01-06 NOTE — Progress Notes (Signed)
Discharge summary [phone]

## 2013-01-07 ENCOUNTER — Ambulatory Visit (HOSPITAL_BASED_OUTPATIENT_CLINIC_OR_DEPARTMENT_OTHER): Payer: BC Managed Care – PPO

## 2013-01-07 ENCOUNTER — Other Ambulatory Visit: Payer: Self-pay | Admitting: Oncology

## 2013-01-07 VITALS — BP 129/79 | HR 75 | Temp 98.3°F | Resp 20

## 2013-01-07 DIAGNOSIS — E860 Dehydration: Secondary | ICD-10-CM

## 2013-01-07 DIAGNOSIS — Z9489 Other transplanted organ and tissue status: Secondary | ICD-10-CM

## 2013-01-07 DIAGNOSIS — D849 Immunodeficiency, unspecified: Secondary | ICD-10-CM

## 2013-01-07 DIAGNOSIS — C9110 Chronic lymphocytic leukemia of B-cell type not having achieved remission: Secondary | ICD-10-CM

## 2013-01-07 DIAGNOSIS — D89811 Chronic graft-versus-host disease: Secondary | ICD-10-CM

## 2013-01-07 MED ORDER — IMMUNE GLOBULIN (HUMAN) 5 GM/100ML IV SOLN
35.0000 g | Freq: Once | INTRAVENOUS | Status: AC
  Administered 2013-01-07: 35 g via INTRAVENOUS
  Filled 2013-01-07: qty 350

## 2013-01-07 MED ORDER — SODIUM CHLORIDE 0.9 % IV SOLN
INTRAVENOUS | Status: AC
  Administered 2013-01-07: 14:00:00 via INTRAVENOUS
  Filled 2013-01-07: qty 1000

## 2013-01-07 NOTE — Patient Instructions (Addendum)
[facility] Discharge Instructions for Patients   Today you received the followingagents octogam  To help prevent nausea and vomiting after your treatment, we encourage you to take your nausea medication  and take it as often as prescribed   If you develop nausea and vomiting that is not controlled by your nausea medication, call the clinic. If it is after clinic hours your family physician or the after hours number for the clinic or go to the Emergency Department.   BELOW ARE SYMPTOMS THAT SHOULD BE REPORTED IMMEDIATELY:  *FEVER GREATER THAN 100.5 F  *CHILLS WITH OR WITHOUT FEVER  NAUSEA AND VOMITING THAT IS NOT CONTROLLED WITH YOUR NAUSEA MEDICATION  *UNUSUAL SHORTNESS OF BREATH  *UNUSUAL BRUISING OR BLEEDING  TENDERNESS IN MOUTH AND THROAT WITH OR WITHOUT PRESENCE OF ULCERS  *URINARY PROBLEMS  *BOWEL PROBLEMS  UNUSUAL RASH Items with * indicate a potential emergency and should be followed up as soon as possible.  One of the nurses will contact you 24 hours after your treatment. Please let the nurse know about any problems that you may have experienced. Feel free to call the clinic you have any questions or concerns. The clinic phone number is [phone].   I have been informed and understand all the instructions given to me. I know to contact the clinic, my physician, or go to the Emergency Department if any problems should occur. I do not have any questions at this time, but understand that I may call the clinic during office hours   should I have any questions or need assistance in obtaining follow up care.    __________________________________________  _____________  __________ Signature of Patient or Authorized Representative            Date                   Time    __________________________________________ Nurse's Signature

## 2013-01-10 ENCOUNTER — Ambulatory Visit (HOSPITAL_BASED_OUTPATIENT_CLINIC_OR_DEPARTMENT_OTHER): Payer: BC Managed Care – PPO

## 2013-01-10 ENCOUNTER — Other Ambulatory Visit: Payer: Self-pay | Admitting: Oncology

## 2013-01-10 VITALS — BP 131/78 | HR 78 | Temp 97.2°F | Resp 20

## 2013-01-10 DIAGNOSIS — E860 Dehydration: Secondary | ICD-10-CM

## 2013-01-10 DIAGNOSIS — C9110 Chronic lymphocytic leukemia of B-cell type not having achieved remission: Secondary | ICD-10-CM

## 2013-01-10 DIAGNOSIS — R197 Diarrhea, unspecified: Secondary | ICD-10-CM

## 2013-01-10 DIAGNOSIS — D89811 Chronic graft-versus-host disease: Secondary | ICD-10-CM

## 2013-01-10 MED ORDER — SODIUM CHLORIDE 0.9 % IV SOLN: Freq: Once | INTRAVENOUS | Status: DC

## 2013-01-10 MED ORDER — HEPARIN SOD (PORK) LOCK FLUSH 100 UNIT/ML IV SOLN
250.0000 [IU] | Freq: Once | INTRAVENOUS | Status: AC
  Administered 2013-01-10: 250 [IU] via INTRAVENOUS
  Filled 2013-01-10: qty 5

## 2013-01-10 MED ORDER — SODIUM CHLORIDE 0.9 % IV SOLN
INTRAVENOUS | Status: DC
  Administered 2013-01-10: 11:00:00 via INTRAVENOUS

## 2013-01-10 MED ORDER — SODIUM CHLORIDE 0.9 % IV SOLN: INTRAVENOUS | Status: DC

## 2013-01-10 NOTE — Patient Instructions (Signed)
Dehydration, Adult Dehydration is when you lose more fluids from the body than you take in. Vital organs like the kidneys, brain, and heart cannot function without a proper amount of fluids and salt. Any loss of fluids from the body can cause dehydration.  CAUSES   Vomiting.  Diarrhea.  Excessive sweating.  Excessive urine output.  Fever. SYMPTOMS  Mild dehydration  Thirst.  Dry lips.  Slightly dry mouth. Moderate dehydration  Very dry mouth.  Sunken eyes.  Skin does not bounce back quickly when lightly pinched and released.  Dark urine and decreased urine production.  Decreased tear production.  Headache. Severe dehydration  Very dry mouth.  Extreme thirst.  Rapid, weak pulse (more than 100 beats per minute at rest).  Cold hands and feet.  Not able to sweat in spite of heat and temperature.  Rapid breathing.  Blue lips.  Confusion and lethargy.  Difficulty being awakened.  Minimal urine production.  No tears. DIAGNOSIS  Your caregiver will diagnose dehydration based on your symptoms and your exam. Blood and urine tests will help confirm the diagnosis. The diagnostic evaluation should also identify the cause of dehydration. TREATMENT  Treatment of mild or moderate dehydration can often be done at home by increasing the amount of fluids that you drink. It is best to drink small amounts of fluid more often. Drinking too much at one time can make vomiting worse. Refer to the home care instructions below. Severe dehydration needs to be treated at the hospital where you will probably be given intravenous (IV) fluids that contain water and electrolytes. HOME CARE INSTRUCTIONS   Ask your caregiver about specific rehydration instructions.  Drink enough fluids to keep your urine clear or pale yellow.  Drink small amounts frequently if you have nausea and vomiting.  Eat as you normally do.  Avoid:  Foods or drinks high in sugar.  Carbonated  drinks.  Juice.  Extremely hot or cold fluids.  Drinks with caffeine.  Fatty, greasy foods.  Alcohol.  Tobacco.  Overeating.  Gelatin desserts.  Wash your hands well to avoid spreading bacteria and viruses.  Only take over-the-counter or prescription medicines for pain, discomfort, or fever as directed by your caregiver.  Ask your caregiver if you should continue all prescribed and over-the-counter medicines.  Keep all follow-up appointments with your caregiver. SEEK MEDICAL CARE IF:  You have abdominal pain and it increases or stays in one area (localizes).  You have a rash, stiff neck, or severe headache.  You are irritable, sleepy, or difficult to awaken.  You are weak, dizzy, or extremely thirsty. SEEK IMMEDIATE MEDICAL CARE IF:   You are unable to keep fluids down or you get worse despite treatment.  You have frequent episodes of vomiting or diarrhea.  You have blood or green matter (bile) in your vomit.  You have blood in your stool or your stool looks black and tarry.  You have not urinated in 6 to 8 hours, or you have only urinated a small amount of very dark urine.  You have a fever.  You faint. MAKE SURE YOU:   Understand these instructions.  Will watch your condition.  Will get help right away if you are not doing well or get worse. Document Released: 12/08/2005 Document Revised: 03/01/2012 Document Reviewed: 07/28/2011 Exit[employer] Patient Information 2013 [employer].  

## 2013-01-11 ENCOUNTER — Telehealth: Payer: Self-pay | Admitting: Oncology

## 2013-01-11 ENCOUNTER — Ambulatory Visit (HOSPITAL_BASED_OUTPATIENT_CLINIC_OR_DEPARTMENT_OTHER): Payer: BC Managed Care – PPO

## 2013-01-11 ENCOUNTER — Telehealth: Payer: Self-pay | Admitting: *Deleted

## 2013-01-11 ENCOUNTER — Other Ambulatory Visit: Payer: Self-pay | Admitting: Oncology

## 2013-01-11 ENCOUNTER — Other Ambulatory Visit (HOSPITAL_BASED_OUTPATIENT_CLINIC_OR_DEPARTMENT_OTHER): Payer: BC Managed Care – PPO | Admitting: Lab

## 2013-01-11 ENCOUNTER — Ambulatory Visit (HOSPITAL_BASED_OUTPATIENT_CLINIC_OR_DEPARTMENT_OTHER): Payer: BC Managed Care – PPO | Admitting: Physician Assistant

## 2013-01-11 ENCOUNTER — Encounter: Payer: Self-pay | Admitting: Physician Assistant

## 2013-01-11 VITALS — BP 129/80 | HR 74 | Temp 97.6°F | Resp 18

## 2013-01-11 VITALS — BP 106/69 | HR 83 | Temp 98.1°F | Resp 20 | Ht 67.0 in | Wt 191.0 lb

## 2013-01-11 DIAGNOSIS — Z9489 Other transplanted organ and tissue status: Secondary | ICD-10-CM

## 2013-01-11 DIAGNOSIS — E860 Dehydration: Secondary | ICD-10-CM

## 2013-01-11 DIAGNOSIS — I10 Essential (primary) hypertension: Secondary | ICD-10-CM

## 2013-01-11 DIAGNOSIS — T8609 Other complications of bone marrow transplant: Secondary | ICD-10-CM

## 2013-01-11 DIAGNOSIS — C9110 Chronic lymphocytic leukemia of B-cell type not having achieved remission: Secondary | ICD-10-CM

## 2013-01-11 DIAGNOSIS — D89811 Chronic graft-versus-host disease: Secondary | ICD-10-CM

## 2013-01-11 DIAGNOSIS — M7989 Other specified soft tissue disorders: Secondary | ICD-10-CM

## 2013-01-11 DIAGNOSIS — R197 Diarrhea, unspecified: Secondary | ICD-10-CM

## 2013-01-11 DIAGNOSIS — K449 Diaphragmatic hernia without obstruction or gangrene: Secondary | ICD-10-CM

## 2013-01-11 LAB — CBC WITH DIFFERENTIAL/PLATELET
Basophils Absolute: 0 10*3/uL (ref 0.0–0.1)
EOS%: 0.2 % (ref 0.0–7.0)
Eosinophils Absolute: 0 10*3/uL (ref 0.0–0.5)
HGB: 9.8 g/dL — ABNORMAL LOW (ref 13.0–17.1)
LYMPH%: 43.5 % (ref 14.0–49.0)
MCH: 29.6 pg (ref 27.2–33.4)
MCV: 89.1 fL (ref 79.3–98.0)
MONO%: 8.5 % (ref 0.0–14.0)
NEUT#: 2.9 10*3/uL (ref 1.5–6.5)
Platelets: 178 10*3/uL (ref 140–400)
RDW: 19 % — ABNORMAL HIGH (ref 11.0–14.6)

## 2013-01-11 LAB — MAGNESIUM (CC13): Magnesium: 1.7 mg/dl (ref 1.5–2.5)

## 2013-01-11 LAB — COMPREHENSIVE METABOLIC PANEL (CC13)
Albumin: 1.9 g/dL — ABNORMAL LOW (ref 3.5–5.0)
BUN: 21 mg/dL (ref 7.0–26.0)
CO2: 21 mEq/L — ABNORMAL LOW (ref 22–29)
Calcium: 7.7 mg/dL — ABNORMAL LOW (ref 8.4–10.4)
Chloride: 108 mEq/L — ABNORMAL HIGH (ref 98–107)
Glucose: 136 mg/dl — ABNORMAL HIGH (ref 70–99)
Potassium: 4.6 mEq/L (ref 3.5–5.1)
Sodium: 134 mEq/L — ABNORMAL LOW (ref 136–145)
Total Protein: 4.3 g/dL — ABNORMAL LOW (ref 6.4–8.3)

## 2013-01-11 MED ORDER — CHOLESTYRAMINE 4 G PO PACK: 1.0000 | PACK | Freq: Two times a day (BID) | ORAL | Status: DC

## 2013-01-11 MED ORDER — SODIUM CHLORIDE 0.9 % IV SOLN
Freq: Once | INTRAVENOUS | Status: AC
  Administered 2013-01-11: 14:00:00 via INTRAVENOUS

## 2013-01-11 MED ORDER — BUDESONIDE 3 MG PO CP24: 9.0000 mg | ORAL_CAPSULE | Freq: Every morning | ORAL | Status: DC

## 2013-01-11 NOTE — Telephone Encounter (Signed)
Per staff phone call and POF I have schedueld appts.  JMW  

## 2013-01-11 NOTE — Telephone Encounter (Signed)
appts made and printed for pt aom °

## 2013-01-11 NOTE — Patient Instructions (Addendum)
Dehydration, Adult Dehydration means your body does not have as much fluid as it needs. Your kidneys, brain, and heart will not work properly without the right amount of fluids and salt.  HOME CARE  Ask your doctor how to replace body fluid losses (rehydrate).  Drink enough fluids to keep your pee (urine) clear or pale yellow.  Drink small amounts of fluids often if you feel sick to your stomach (nauseous) or throw up (vomit).  Eat like you normally do.  Avoid:  Foods or drinks high in sugar.  Bubbly (carbonated) drinks.  Juice.  Very hot or cold fluids.  Drinks with caffeine.  Fatty, greasy foods.  Alcohol.  Tobacco.  Eating too much.  Gelatin desserts.  Wash your hands to avoid spreading germs (bacteria, viruses).  Only take medicine as told by your doctor.  Keep all doctor visits as told. GET HELP RIGHT AWAY IF:   You cannot drink something without throwing up.  You get worse even with treatment.  Your vomit has blood in it or looks greenish.  Your poop (stool) has blood in it or looks black and tarry.  You have not peed in 6 to 8 hours.  You pee a small amount of very dark pee.  You have a fever.  You pass out (faint).  You have belly (abdominal) pain that gets worse or stays in one spot (localizes).  You have a rash, stiff neck, or bad headache.  You get easily annoyed, sleepy, or are hard to wake up.  You feel weak, dizzy, or very thirsty. MAKE SURE YOU:   Understand these instructions.  Will watch your condition.  Will get help right away if you are not doing well or get worse. Document Released: 10/04/2009 Document Revised: 03/01/2012 Document Reviewed: 07/28/2011 [employer] Patient Information 2013 [employer].  

## 2013-01-11 NOTE — Progress Notes (Signed)
ID: Timothy [name]   DOB: 1946/01/17  MR#: [id number]  WUJ#:[id number]  HISTORY OF PRESENT ILLNESS: We have very complete records from Dr. Sydnee Levans in [place], and in summary:  The patient was initially diagnosed in August 2000, with a white cell count of 23,600, but normal hemoglobin and platelets, and no significant symptomatology. Over the next several years his white cell count drifted up, and he eventually developed some symptoms of night sweats in particular, leading to treatment with fludarabine, Cytoxan and rituxan for five cycles given between December 2006 and May 2007.  We have CT scans from June 2006, November 2006 and April 2007, and comparing the November 2006 and April 2007 scans, there was near complete response. He had subsequent therapy in [place] as detailed below, but with decreased response, leading to allogeneic stem-cell transplant at the [facility] 02/24/2012.  INTERVAL HISTORY: Timothy [name] returns with his wife [name]  for followup of his CLL, status post allogeneic stem cell transplant in [place] March 2013.   Timothy [name] has been receiving regular supportive IV fluids since his visit one week ago on January 14. He feels that this has helped significantly, and he is feeling "much better" today.  His energy level has improved. He still finds it difficult to drink 64 ounces of fluid daily. He continues to have loose stools, several times daily, although he notes that there is more "substance" to the stool. He has been taking Lomotil with little relief.   His appetite has improved slightly, and he has gained approximately half a pound since last week.   Timothy [name] is also status post IVIG on 01/07/2013 for an IgG of 203.     REVIEW OF SYSTEMS: Timothy [name]  denies any fevers or chills. He has no new rashes, and the current skin changes on his face continue to improve. He's had no additional oral ulcerations or oral sensitivity, and has no problems swallowing. He denies  signs of abnormal bleeding. He has had no recent nausea and no emesis. He denies any cough, phlegm production, or increased shortness of breath and has had no chest pain. He denies any abnormal headaches. He has mild pain which is treated very effectively with half to one tablet of hydrocodone/APAP each morning. He's having chronic swelling in his lower extremities, which improves only slightly with elevation.  A detailed review of systems is otherwise stable and noncontributory.   PAST MEDICAL HISTORY: Past Medical History  Diagnosis Date  . 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 Diverticular disease   . Hyperlipidemia   . Obesity   . Hypertension   . Hiatal hernia   . CMV (cytomegalovirus) antibody positive     pre-transplant, with seroconversion x2 pst-transplant  . Right bundle branch block     pre-transplant  . CKD (chronic kidney disease) stage 2, GFR 60-89 ml/min   . Pancytopenia   . Steroid-induced diabetes   . Atrial fibrillation     post-transplant  . Myopathy   . 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 Leukemia, chronic lymphoid   . 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 Diarrhea in adult patient 12/05/2012    Due to active GVHD  . CLL (chronic lymphocytic leukemia) 12/05/2012    Dx 07/1999; started Rx 12/06  AlloBMT 3/13  . Rash of face 12/05/2012    Due to GVHD    PAST SURGICAL HISTORY: Past Surgical History  Procedure Date  . Tonsillectomy and adenoidectomy   . Bone marrow transplant   . Flexible sigmoidoscopy 11/17/2012    Procedure: FLEXIBLE SIGMOIDOSCOPY;  Surgeon: Petra Kuba, MD;  Location: [facility];  Service: Endoscopy;  Laterality: N/A;  Dr Ewing Schlein states will be admitted to rooom 1339 11/16/12  . Esophagogastroduodenoscopy 11/17/2012    Procedure: ESOPHAGOGASTRODUODENOSCOPY (EGD);  Surgeon: Petra Kuba, MD;  Location: [facility];   Service: Endoscopy;  Laterality: N/A;    FAMILY HISTORY Family History  Problem Relation Age of Onset  . Cancer Father    The patient's father died from complications of chronic lymphocytic leukemia at the age of 31.  It had been diagnosed seven years before when he was 67.  The patient is enrolled in a familial chronic lymphocytic leukemia study out of the [facility].  The patient's mother is 37, alive, unfortunately suffering with dementia, and he has a brother, 74, who is otherwise in fair health.   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  His wife of >40 years, [name], is a homemaker.  Their daughter, [name], lives in [place].  She also is a Futures trader.  The patient has an 58 year old grandson and an 59-year-old granddaughter, and that is really the main reason he moved to this area.  He is a International aid/development worker.     ADVANCED DIRECTIVES:  HEALTH MAINTENANCE: History  Substance Use Topics  . Smoking status: Never Smoker   . Smokeless tobacco: Never Used  . Alcohol Use: No     Colonoscopy:  PAP:  Bone density:  Lipid panel:  Allergies  Allergen Reactions  . Benadryl (Diphenhydramine Hcl)     "Restless leg syndrome"    Current Outpatient Prescriptions  Medication Sig Dispense Refill  . levofloxacin (LEVAQUIN) 500 MG tablet       . lisinopril (PRINIVIL,ZESTRIL) 10 MG tablet       . pantoprazole (PROTONIX) 40 MG tablet       . acyclovir (ZOVIRAX) 400 MG tablet Take 1 tablet (400 mg total) by mouth 2 (two) times daily.  60 tablet  2  . antiseptic oral rinse (BIOTENE) LIQD 15 mLs by Mouth Rinse route 2 times daily at 12 noon and 4 pm.  237 mL  6  . budesonide (ENTOCORT EC) 3 MG 24 hr capsule Take 3 capsules (9 mg total) by mouth every morning.  90 capsule  6  . Calcium Citrate (CITRACAL PO) Take 1,200 mg by mouth 2 (two) times daily.      .  cholecalciferol (VITAMIN D) 1000 UNITS tablet Take 1,000 Units by mouth every evening.       . cholestyramine (QUESTRAN) 4 G packet Take 1 packet by mouth 2 (two) times daily with a meal.  60 each  1  . diphenoxylate-atropine (LOMOTIL) 2.5-0.025 MG per tablet Take 1 tablet by mouth 4 (four) times daily as needed for diarrhea or loose stools.  30 tablet  1  . dronabinol (MARINOL) 2.5 MG capsule Take 1 capsule (2.5 mg total) by mouth 2 (two) times daily before lunch and supper.  60 capsule  0  . fluconazole (DIFLUCAN) 100 MG tablet Take 1 tablet (100 mg total) by mouth every morning.  30 tablet  0  . HYDROcodone-acetaminophen (NORCO) 10-325 MG per tablet Take 1 tablet by mouth daily with breakfast.  30 tablet  0  . labetalol (NORMODYNE)  200 MG tablet Take 1 tablet (200 mg total) by mouth 2 (two) times daily.  60 tablet  12  . meclizine (ANTIVERT) 25 MG tablet Take 25 mg by mouth Twice daily as needed. For dizziness      . miconazole (MICOTIN) 2 % cream Apply topically 2 (two) times daily.  30 g  1  . Multiple Vitamins-Minerals (CENTRUM SILVER ADULT 50+) TABS Take 1 tablet by mouth every evening.       . Nutritional Supplements (FEEDING SUPPLEMENT, VITAL 1.5 CAL,) LIQD Take 237 mLs by mouth 2 (two) times daily.  60 Can  6  . omeprazole (PRILOSEC) 40 MG capsule Take 40 mg by mouth daily.      . ondansetron (ZOFRAN-ODT) 8 MG disintegrating tablet Take 8 mg by mouth 2 (two) times daily as needed. For nausea      . predniSONE (DELTASONE) 20 MG tablet Take 1 tablet (20 mg total) by mouth daily with breakfast.  30 tablet  1  . prochlorperazine (COMPAZINE) 10 MG tablet Take 10 mg by mouth every 6 (six) hours as needed. Nausea      . protein supplement (RESOURCE BENEPROTEIN) POWD Take 6 g by mouth 3 (three) times daily with meals.      . psyllium (HYDROCIL/METAMUCIL) 95 % PACK Take 1 packet by mouth daily.  56 each    . saccharomyces boulardii (FLORASTOR) 250 MG capsule Take 2 capsules (500 mg total) by mouth  2 (two) times daily. Probiotic  120 capsule  1  . sertraline (ZOLOFT) 50 MG tablet Take 50-100 mg by mouth daily. Takes 50 mg one day and 100 mg the next day      . Sodium Chloride Flush (NORMAL SALINE FLUSH) 0.9 % SOLN       . sulfamethoxazole-trimethoprim (BACTRIM DS) 800-160 MG per tablet Take 1 tablet by mouth 2 (two) times a week. Mondays and Tuesdays, twice daily      . tacrolimus (PROGRAF) 0.5 MG capsule Take 1.5 mg by mouth 2 (two) times daily.      Marland Kitchen testosterone (ANDRODERM) 4 MG/24HR PT24 patch Place 1 patch onto the skin daily.  30 patch  3   No current facility-administered medications for this visit.   Facility-Administered Medications Ordered in Other Visits  Medication Dose Route Frequency Provider Last Rate Last Dose  . sodium chloride 0.9 % injection 10 mL  10 mL Intravenous PRN Lowella Dell, MD   10 mL at 08/11/12 1606    OBJECTIVE: Middle-aged white male who appears fatigued but comfortable, and in no acute distress Filed Vitals:   01/11/13 1115  BP: 106/69  Pulse: 83  Temp: 98.1 F (36.7 C)  Resp: 20     Body mass index is 29.91 kg/(m^2).    Filed Weights   01/11/13 1115  Weight: 191 lb (86.637 kg)   ECOG FS: 2  Sclerae unicteric  Oropharynx clear with no lesions or thrush No cervical or supraclavicular adenopathy  Lungs clear to auscultation,  no rales or rhonchi  Heart regular rate and rhythm, no murmur appreciated Abd  +BS , soft, nontender to palpation  MSK   2+ bilateral ankle edema, equal bilaterally. No upper extremity edema noted Neuro: nonfocal, well oriented   LAB RESULTS:  Today's labs are pending, 01/11/2013.  CMV   Negative  11/04/2012   Negative  10/29/2012  Negative  10/21/2012  Negative  10/14/2012  Negative  10/06/2012  Negative  09/29/2012  Negative  09/24/2012  Negative  09/15/2012  Positive, <200  09/08/2012   Lab Results  Component Value Date   WBC 6.0 01/11/2013   NEUTROABS 2.9 01/11/2013   HGB 9.8* 01/11/2013    HCT 29.5* 01/11/2013   MCV 89.1 01/11/2013   PLT 178 01/11/2013      Chemistry      Component Value Date/Time   NA 135* 01/04/2013 1437   NA 136 12/27/2012 0440   K 4.1 01/04/2013 1437   K 3.9 12/27/2012 0440   CL 106 01/04/2013 1437   CL 108 12/27/2012 0440   CO2 22 01/04/2013 1437   CO2 22 12/27/2012 0440   BUN 21.0 01/04/2013 1437   BUN 19 12/27/2012 0440   CREATININE 0.8 01/04/2013 1437   CREATININE 0.63 12/27/2012 0440      Component Value Date/Time   CALCIUM 7.5* 01/04/2013 1437   CALCIUM 6.9* 12/27/2012 0440   ALKPHOS 132 01/04/2013 1437   ALKPHOS 91 12/27/2012 0440   AST 20 01/04/2013 1437   AST 18 12/27/2012 0440   ALT 19 01/04/2013 1437   ALT 18 12/27/2012 0440   BILITOT 0.27 01/04/2013 1437   BILITOT 0.2* 12/27/2012 0440     IgG:  203 01/04/2013  LDH: 232 01/04/2013   Lipid Panel     Component Value Date/Time   CHOL 225* 08/20/2012 1341   TRIG 301* 08/20/2012 1341   HDL 23* 08/20/2012 1341   CHOLHDL 9.8 08/20/2012 1341   VLDL 60* 08/20/2012 1341   LDLCALC 142* 08/20/2012 1341    Magnesium was 1.6 on 01/04/2013.  Phosphorus was 3.2 on 01/04/2013.  Results for Timothy, [name] (MRN [id number]) as of 10/14/2012 17:26  Ref. Range 07/21/2012 10:20 07/28/2012 15:22 08/04/2012 14:45 08/06/2012 15:10 08/11/2012 09:17  Tacrolimus Lvl Latest Range: 5.0-20.0 ng/mL 3.9 (L) 9.7 9.0 6.9 6.9  01/04/2013 8.1  STUDIES:  No recent studies.   ASSESSMENT: 67 y.o. [place] man with a history of well-differentiated lymphocytic lymphoma/ chronic lymphoid leukemia initially diagnosed in 2000, not requiring intervention until 2006; with multiple chromosomal abnormalities.  His treatment history is as follows:  (1) fludarabine/cyclophosphamide/rituximab x5 completed May 2007.   (2) rituximab for 8 doses October 2010, with partial response   (3) Leustatin and ofatumumab weekly x8 July to September 2011 followed by maintenance ofatumumab maintenance ofatumumab every 2 months, with initial response but rising  counts September 2012   (4) status-post unrelated donor stem-cell transplant 02/24/2012 at the [facility]  (a) conditioning regimen consisted of fludarabine + TBI at 200 cGy, followed by rituximab x27;  (b) CMV reactivation x3 (patient CMV positive, donor negative), s/p ganciclovir treatment; 3d reactivation August 2013, s/p gancyclovir, with negative PCR mid-September 2013; last gancyclovir dose 10/06/2012 (c) Chronic GVHD: involving gut and skin, treated with steroids, tacrolimus and MMF.  MMF was eventually d/c'd and tacrolimus currently at a dose of 1.5mg  BID (d) atrial fibrillation: resolved on brief amiodarone regimen (e) steroid-induced myopathy: improving  (f) hypomagnesemia: resolved after d/c gancyclovir (g) hypogammaglobulinemia: s/p IVIG most recently 01/07/2013. (h) elevated triglycerides (606 on 07/14/2012)  (i) adrenal insufficiency: on prednisone and budesonide (j) pancytopenia, improved  (5) restaging studies September 2013  including CT scans, flow cytometry, and bone marrow biopsy, showed no evidence of residual chronic lymphoid leukemia.  (6) recurrent GVHD (skin rash, mouth changes, severe diarrhea and gastric/duodenal/colonic biopsies 11/17/2012 c/w GVHD grade 2) now only remaining sign is mild but bothersome diarrhea; c diff negative x3; continuing current regimen   (7) severe malnutrition -- on VITAL supplement in addition to  regular diet; on Marinol for anorexia  (8) testosterone deficiency--on patch   (9) deconditioning: ongoing REHAB   (10) mild dehydration: encouraged increased po fluids; may need IVF support intermittently   (11) steroid-induced osteoporosis with compression fractures: received pamidronate 12/18/2012   (12) nausea: improved on current meds   PLAN: This case was reviewed with Dr. Darnelle Catalan.    Timothy [name] will receive supportive IV fluids today, Thursday, and Saturday. I will see him again next week on January 28 for repeat labs, physical exam, and  supportive IV fluids..  We will plan on seeing him for labs, follow up and supportive fluids on a weekly basis for the next several weeks.  If he finds he is unable to keep himself well hydrated, we can also consider IV fluids at home through home health.    Timothy [name] will continue on all of his current medications, including the prednisone at 20 mg daily. We're going to try Questran, however, in place of Lomotil, taken twice daily with meals as needed for diarrhea. He'll also continue to receive physical therapy at home which is proving to be very beneficial.  Timothy [name] is scheduled to be seen in [place] on 02/08/13.  Aurther Loft and [place] both voice understanding and agreement with this plan and will call with any changes or problems.  Zollie Scale    01/11/2013

## 2013-01-13 ENCOUNTER — Ambulatory Visit (HOSPITAL_BASED_OUTPATIENT_CLINIC_OR_DEPARTMENT_OTHER): Payer: BC Managed Care – PPO

## 2013-01-13 VITALS — BP 149/91 | HR 78 | Temp 98.0°F

## 2013-01-13 DIAGNOSIS — D89811 Chronic graft-versus-host disease: Secondary | ICD-10-CM

## 2013-01-13 DIAGNOSIS — E860 Dehydration: Secondary | ICD-10-CM

## 2013-01-13 DIAGNOSIS — C9110 Chronic lymphocytic leukemia of B-cell type not having achieved remission: Secondary | ICD-10-CM

## 2013-01-13 DIAGNOSIS — R197 Diarrhea, unspecified: Secondary | ICD-10-CM

## 2013-01-13 MED ORDER — HEPARIN SOD (PORK) LOCK FLUSH 100 UNIT/ML IV SOLN
500.0000 [IU] | Freq: Once | INTRAVENOUS | Status: AC
  Administered 2013-01-13: 250 [IU] via INTRAVENOUS
  Filled 2013-01-13: qty 5

## 2013-01-13 MED ORDER — SODIUM CHLORIDE 0.9 % IV SOLN
Freq: Once | INTRAVENOUS | Status: AC
  Administered 2013-01-13: 10:00:00 via INTRAVENOUS

## 2013-01-13 MED ORDER — SODIUM CHLORIDE 0.9 % IJ SOLN
10.0000 mL | INTRAMUSCULAR | Status: DC | PRN
  Administered 2013-01-13: 10 mL via INTRAVENOUS
  Filled 2013-01-13: qty 10

## 2013-01-13 NOTE — Patient Instructions (Addendum)
Call [facility] at [phone] if you have further questions or call 911 and go to the nearest emergency facility when you would require immediate attention.    Dehydration, Adult Dehydration is when you lose more fluids from the body than you take in. Vital organs like the kidneys, brain, and heart cannot function without a proper amount of fluids and salt. Any loss of fluids from the body can cause dehydration.  CAUSES   Vomiting.  Diarrhea.  Excessive sweating.  Excessive urine output.  Fever. SYMPTOMS  Mild dehydration  Thirst.  Dry lips.  Slightly dry mouth. Moderate dehydration  Very dry mouth.  Sunken eyes.  Skin does not bounce back quickly when lightly pinched and released.  Dark urine and decreased urine production.  Decreased tear production.  Headache. Severe dehydration  Very dry mouth.  Extreme thirst.  Rapid, weak pulse (more than 100 beats per minute at rest).  Cold hands and feet.  Not able to sweat in spite of heat and temperature.  Rapid breathing.  Blue lips.  Confusion and lethargy.  Difficulty being awakened.  Minimal urine production.  No tears. DIAGNOSIS  Your caregiver will diagnose dehydration based on your symptoms and your exam. Blood and urine tests will help confirm the diagnosis. The diagnostic evaluation should also identify the cause of dehydration. TREATMENT  Treatment of mild or moderate dehydration can often be done at home by increasing the amount of fluids that you drink. It is best to drink small amounts of fluid more often. Drinking too much at one time can make vomiting worse. Refer to the home care instructions below. Severe dehydration needs to be treated at the hospital where you will probably be given intravenous (IV) fluids that contain water and electrolytes. HOME CARE INSTRUCTIONS   Ask your caregiver about specific rehydration instructions.  Drink enough fluids to keep your urine clear or pale  yellow.  Drink small amounts frequently if you have nausea and vomiting.  Eat as you normally do.  Avoid:  Foods or drinks high in sugar.  Carbonated drinks.  Juice.  Extremely hot or cold fluids.  Drinks with caffeine.  Fatty, greasy foods.  Alcohol.  Tobacco.  Overeating.  Gelatin desserts.  Wash your hands well to avoid spreading bacteria and viruses.  Only take over-the-counter or prescription medicines for pain, discomfort, or fever as directed by your caregiver.  Ask your caregiver if you should continue all prescribed and over-the-counter medicines.  Keep all follow-up appointments with your caregiver. SEEK MEDICAL CARE IF:  You have abdominal pain and it increases or stays in one area (localizes).  You have a rash, stiff neck, or severe headache.  You are irritable, sleepy, or difficult to awaken.  You are weak, dizzy, or extremely thirsty. SEEK IMMEDIATE MEDICAL CARE IF:   You are unable to keep fluids down or you get worse despite treatment.  You have frequent episodes of vomiting or diarrhea.  You have blood or green matter (bile) in your vomit.  You have blood in your stool or your stool looks black and tarry.  You have not urinated in 6 to 8 hours, or you have only urinated a small amount of very dark urine.  You have a fever.  You faint. MAKE SURE YOU:   Understand these instructions.  Will watch your condition.  Will get help right away if you are not doing well or get worse. Document Released: 12/08/2005 Document Revised: 03/01/2012 Document Reviewed: 07/28/2011 [facility] Patient Information 2013 [place], [place].

## 2013-01-15 ENCOUNTER — Ambulatory Visit (HOSPITAL_BASED_OUTPATIENT_CLINIC_OR_DEPARTMENT_OTHER): Payer: BC Managed Care – PPO

## 2013-01-15 VITALS — BP 144/96 | HR 85 | Temp 97.9°F

## 2013-01-15 DIAGNOSIS — D89811 Chronic graft-versus-host disease: Secondary | ICD-10-CM

## 2013-01-15 DIAGNOSIS — R197 Diarrhea, unspecified: Secondary | ICD-10-CM

## 2013-01-15 DIAGNOSIS — C9110 Chronic lymphocytic leukemia of B-cell type not having achieved remission: Secondary | ICD-10-CM

## 2013-01-15 DIAGNOSIS — E860 Dehydration: Secondary | ICD-10-CM

## 2013-01-15 MED ORDER — SODIUM CHLORIDE 0.9 % IV SOLN
INTRAVENOUS | Status: DC
  Administered 2013-01-15: 09:00:00 via INTRAVENOUS

## 2013-01-15 NOTE — Patient Instructions (Addendum)
Dehydration, Adult Dehydration is when you lose more fluids from the body than you take in. Vital organs like the kidneys, brain, and heart cannot function without a proper amount of fluids and salt. Any loss of fluids from the body can cause dehydration.  CAUSES   Vomiting.  Diarrhea.  Excessive sweating.  Excessive urine output.  Fever. SYMPTOMS  Mild dehydration  Thirst.  Dry lips.  Slightly dry mouth. Moderate dehydration  Very dry mouth.  Sunken eyes.  Skin does not bounce back quickly when lightly pinched and released.  Dark urine and decreased urine production.  Decreased tear production.  Headache. Severe dehydration  Very dry mouth.  Extreme thirst.  Rapid, weak pulse (more than 100 beats per minute at rest).  Cold hands and feet.  Not able to sweat in spite of heat and temperature.  Rapid breathing.  Blue lips.  Confusion and lethargy.  Difficulty being awakened.  Minimal urine production.  No tears. DIAGNOSIS  Your caregiver will diagnose dehydration based on your symptoms and your exam. Blood and urine tests will help confirm the diagnosis. The diagnostic evaluation should also identify the cause of dehydration. TREATMENT  Treatment of mild or moderate dehydration can often be done at home by increasing the amount of fluids that you drink. It is best to drink small amounts of fluid more often. Drinking too much at one time can make vomiting worse. Refer to the home care instructions below. Severe dehydration needs to be treated at the hospital where you will probably be given intravenous (IV) fluids that contain water and electrolytes. HOME CARE INSTRUCTIONS   Ask your caregiver about specific rehydration instructions.  Drink enough fluids to keep your urine clear or pale yellow.  Drink small amounts frequently if you have nausea and vomiting.  Eat as you normally do.  Avoid:  Foods or drinks high in sugar.  Carbonated  drinks.  Juice.  Extremely hot or cold fluids.  Drinks with caffeine.  Fatty, greasy foods.  Alcohol.  Tobacco.  Overeating.  Gelatin desserts.  Wash your hands well to avoid spreading bacteria and viruses.  Only take over-the-counter or prescription medicines for pain, discomfort, or fever as directed by your caregiver.  Ask your caregiver if you should continue all prescribed and over-the-counter medicines.  Keep all follow-up appointments with your caregiver. SEEK MEDICAL CARE IF:  You have abdominal pain and it increases or stays in one area (localizes).  You have a rash, stiff neck, or severe headache.  You are irritable, sleepy, or difficult to awaken.  You are weak, dizzy, or extremely thirsty. SEEK IMMEDIATE MEDICAL CARE IF:   You are unable to keep fluids down or you get worse despite treatment.  You have frequent episodes of vomiting or diarrhea.  You have blood or green matter (bile) in your vomit.  You have blood in your stool or your stool looks black and tarry.  You have not urinated in 6 to 8 hours, or you have only urinated a small amount of very dark urine.  You have a fever.  You faint. MAKE SURE YOU:   Understand these instructions.  Will watch your condition.  Will get help right away if you are not doing well or get worse. Document Released: 12/08/2005 Document Revised: 03/01/2012 Document Reviewed: 07/28/2011 Exit[employer] Patient Information 2013 [employer].  

## 2013-01-18 ENCOUNTER — Other Ambulatory Visit: Payer: Self-pay | Admitting: Physical Medicine & Rehabilitation

## 2013-01-18 ENCOUNTER — Telehealth: Payer: Self-pay | Admitting: Oncology

## 2013-01-18 ENCOUNTER — Ambulatory Visit (HOSPITAL_BASED_OUTPATIENT_CLINIC_OR_DEPARTMENT_OTHER): Payer: BC Managed Care – PPO

## 2013-01-18 ENCOUNTER — Other Ambulatory Visit (HOSPITAL_BASED_OUTPATIENT_CLINIC_OR_DEPARTMENT_OTHER): Payer: BC Managed Care – PPO | Admitting: Lab

## 2013-01-18 ENCOUNTER — Other Ambulatory Visit: Payer: Self-pay | Admitting: Oncology

## 2013-01-18 ENCOUNTER — Telehealth: Payer: Self-pay | Admitting: *Deleted

## 2013-01-18 ENCOUNTER — Encounter: Payer: Self-pay | Admitting: Physician Assistant

## 2013-01-18 ENCOUNTER — Ambulatory Visit (HOSPITAL_BASED_OUTPATIENT_CLINIC_OR_DEPARTMENT_OTHER): Payer: BC Managed Care – PPO | Admitting: Physician Assistant

## 2013-01-18 VITALS — BP 123/74 | HR 54 | Temp 98.3°F | Resp 20 | Ht 67.0 in | Wt 183.0 lb

## 2013-01-18 DIAGNOSIS — C9110 Chronic lymphocytic leukemia of B-cell type not having achieved remission: Secondary | ICD-10-CM

## 2013-01-18 DIAGNOSIS — D89811 Chronic graft-versus-host disease: Secondary | ICD-10-CM

## 2013-01-18 DIAGNOSIS — T8600 Unspecified complication of bone marrow transplant: Secondary | ICD-10-CM

## 2013-01-18 DIAGNOSIS — E860 Dehydration: Secondary | ICD-10-CM

## 2013-01-18 DIAGNOSIS — R197 Diarrhea, unspecified: Secondary | ICD-10-CM

## 2013-01-18 DIAGNOSIS — Z9489 Other transplanted organ and tissue status: Secondary | ICD-10-CM

## 2013-01-18 LAB — CBC WITH DIFFERENTIAL/PLATELET
EOS%: 0.3 % (ref 0.0–7.0)
MCH: 31.3 pg (ref 27.2–33.4)
MCV: 91 fL (ref 79.3–98.0)
MONO%: 8.4 % (ref 0.0–14.0)
NEUT#: 3.2 10*3/uL (ref 1.5–6.5)
RBC: 3.1 10*6/uL — ABNORMAL LOW (ref 4.20–5.82)
RDW: 18.7 % — ABNORMAL HIGH (ref 11.0–14.6)
lymph#: 1.9 10*3/uL (ref 0.9–3.3)

## 2013-01-18 LAB — COMPREHENSIVE METABOLIC PANEL (CC13)
ALT: 15 U/L (ref 0–55)
AST: 17 U/L (ref 5–34)
Albumin: 1.9 g/dL — ABNORMAL LOW (ref 3.5–5.0)
Alkaline Phosphatase: 79 U/L (ref 40–150)
Chloride: 106 mEq/L (ref 98–107)
Potassium: 3.8 mEq/L (ref 3.5–5.1)
Sodium: 136 mEq/L (ref 136–145)
Total Protein: 4.4 g/dL — ABNORMAL LOW (ref 6.4–8.3)

## 2013-01-18 MED ORDER — SODIUM CHLORIDE 0.9 % IV SOLN
INTRAVENOUS | Status: DC
  Administered 2013-01-18: 13:00:00 via INTRAVENOUS

## 2013-01-18 NOTE — Progress Notes (Signed)
ID: Timothy [name]   DOB: September 03, 1946  MR#: [id number]  WUJ#:[id number]  HISTORY OF PRESENT ILLNESS: We have very complete records from Dr. Sydnee Levans in [place], and in summary:  The patient was initially diagnosed in August 2000, with a white cell count of 23,600, but normal hemoglobin and platelets, and no significant symptomatology. Over the next several years his white cell count drifted up, and he eventually developed some symptoms of night sweats in particular, leading to treatment with fludarabine, Cytoxan and rituxan for five cycles given between December 2006 and May 2007.  We have CT scans from June 2006, November 2006 and April 2007, and comparing the November 2006 and April 2007 scans, there was near complete response. He had subsequent therapy in [place] as detailed below, but with decreased response, leading to allogeneic stem-cell transplant at the [facility] 02/24/2012.  INTERVAL HISTORY: Calven returns with his wife [name]  for followup of his CLL, status post allogeneic stem cell transplant in [place] March 2013.   Aarav has been receiving regular supportive IV fluids for the past 2 weeks, and feels like his energy level continues to improve. Although he has lost 8-9 pounds over the past week, Kasten tells me he is eating and drinking a little better at home. He continues on Marinol which is helping his appetite, and of course is on the prednisone at 20 mg daily. His diarrhea continues to improve. He averages 3-4 stools daily, one of which might be watery, but the others are more formed with the use of Questran twice daily. He denies any blood or mucus in the stool. He's had no abdominal pain. He also denies any fevers, chills, or night sweats.     REVIEW OF SYSTEMS: Hektor   has no new rashes, and the current skin changes on his face continue to improve. He's had no additional oral ulcerations or oral sensitivity, and has no problems swallowing. He denies signs  of abnormal bleeding. He has had no recent nausea and no emesis. He denies any cough, phlegm production, or increased shortness of breath and has had no chest pain. He continues to feel weak and fatigued. He denies any abnormal headaches. He has mild pain which is treated very effectively with half to one tablet of hydrocodone/APAP each morning. He's having chronic swelling in his lower extremities, which improves only slightly with elevation.  A detailed review of systems is otherwise stable and noncontributory.   PAST MEDICAL HISTORY: Past Medical History  Diagnosis Date  . 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 Diverticular disease   . Hyperlipidemia   . Obesity   . Hypertension   . Hiatal hernia   . CMV (cytomegalovirus) antibody positive     pre-transplant, with seroconversion x2 pst-transplant  . Right bundle branch block     pre-transplant  . CKD (chronic kidney disease) stage 2, GFR 60-89 ml/min   . Pancytopenia   . Steroid-induced diabetes   . Atrial fibrillation     post-transplant  . Myopathy   . 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 Leukemia, chronic lymphoid   . 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 Diarrhea in adult patient 12/05/2012    Due to active GVHD  . CLL (chronic lymphocytic leukemia) 12/05/2012    Dx 07/1999; started Rx 12/06  AlloBMT 3/13  . Rash of face 12/05/2012    Due to GVHD  PAST SURGICAL HISTORY: Past Surgical History  Procedure Date  . Tonsillectomy and adenoidectomy   . Bone marrow transplant   . Flexible sigmoidoscopy 11/17/2012    Procedure: FLEXIBLE SIGMOIDOSCOPY;  Surgeon: Petra Kuba, MD;  Location: [facility];  Service: Endoscopy;  Laterality: N/A;  Dr Ewing Schlein states will be admitted to rooom 1339 11/16/12  . Esophagogastroduodenoscopy 11/17/2012    Procedure: ESOPHAGOGASTRODUODENOSCOPY (EGD);  Surgeon: Petra Kuba,  MD;  Location: [facility];  Service: Endoscopy;  Laterality: N/A;    FAMILY HISTORY Family History  Problem Relation Age of Onset  . Cancer Father    The patient's father died from complications of chronic lymphocytic leukemia at the age of 40.  It had been diagnosed seven years before when he was 65.  The patient is enrolled in a familial chronic lymphocytic leukemia study out of the [facility].  The patient's mother is 68, alive, unfortunately suffering with dementia, and he has a brother, 27, who is otherwise in fair health.   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  His wife of >40 years, [name], is a homemaker.  Their daughter, [name], lives in [place].  She also is a Futures trader.  The patient has an 51 year old grandson and an 66-year-old granddaughter, and that is really the main reason he moved to this area.  He is a International aid/development worker.     ADVANCED DIRECTIVES:  HEALTH MAINTENANCE: History  Substance Use Topics  . Smoking status: Never Smoker   . Smokeless tobacco: Never Used  . Alcohol Use: No     Colonoscopy:  PAP:  Bone density:  Lipid panel:  Allergies  Allergen Reactions  . Benadryl (Diphenhydramine Hcl)     "Restless leg syndrome"    Current Outpatient Prescriptions  Medication Sig Dispense Refill  . acyclovir (ZOVIRAX) 400 MG tablet Take 1 tablet (400 mg total) by mouth 2 (two) times daily.  60 tablet  2  . antiseptic oral rinse (BIOTENE) LIQD 15 mLs by Mouth Rinse route 2 times daily at 12 noon and 4 pm.  237 mL  6  . budesonide (ENTOCORT EC) 3 MG 24 hr capsule Take 3 capsules (9 mg total) by mouth every morning.  90 capsule  6  . Calcium Citrate (CITRACAL PO) Take 1,200 mg by mouth 2 (two) times daily.      . cholecalciferol (VITAMIN D) 1000 UNITS tablet Take 1,000 Units by mouth every evening.       . cholestyramine (QUESTRAN)  4 G packet Take 1 packet by mouth 2 (two) times daily with a meal.  60 each  1  . diphenoxylate-atropine (LOMOTIL) 2.5-0.025 MG per tablet Take 1 tablet by mouth 4 (four) times daily as needed for diarrhea or loose stools.  30 tablet  1  . dronabinol (MARINOL) 2.5 MG capsule Take 1 capsule (2.5 mg total) by mouth 2 (two) times daily before lunch and supper.  60 capsule  0  . fluconazole (DIFLUCAN) 100 MG tablet Take 1 tablet (100 mg total) by mouth every morning.  30 tablet  0  . HYDROcodone-acetaminophen (NORCO) 10-325 MG per tablet Take 1 tablet by mouth daily with breakfast.  30 tablet  0  . labetalol (NORMODYNE) 200 MG tablet Take 1 tablet (200 mg total) by mouth 2 (two) times daily.  60 tablet  12  . lisinopril (PRINIVIL,ZESTRIL) 10 MG tablet       .  meclizine (ANTIVERT) 25 MG tablet Take 25 mg by mouth Twice daily as needed. For dizziness      . Multiple Vitamins-Minerals (CENTRUM SILVER ADULT 50+) TABS Take 1 tablet by mouth every evening.       . Nutritional Supplements (FEEDING SUPPLEMENT, VITAL 1.5 CAL,) LIQD Take 237 mLs by mouth 2 (two) times daily.  60 Can  6  . omeprazole (PRILOSEC) 40 MG capsule Take 40 mg by mouth daily.      . ondansetron (ZOFRAN-ODT) 8 MG disintegrating tablet Take 8 mg by mouth 2 (two) times daily as needed. For nausea      . predniSONE (DELTASONE) 20 MG tablet Take 1 tablet (20 mg total) by mouth daily with breakfast.  30 tablet  1  . prochlorperazine (COMPAZINE) 10 MG tablet Take 10 mg by mouth every 6 (six) hours as needed. Nausea      . protein supplement (RESOURCE BENEPROTEIN) POWD Take 6 g by mouth 3 (three) times daily with meals.      . psyllium (HYDROCIL/METAMUCIL) 95 % PACK Take 1 packet by mouth daily.  56 each    . saccharomyces boulardii (FLORASTOR) 250 MG capsule Take 2 capsules (500 mg total) by mouth 2 (two) times daily. Probiotic  120 capsule  1  . sertraline (ZOLOFT) 50 MG tablet Take 50-100 mg by mouth daily. Takes 50 mg one day and 100 mg the  next day      . Sodium Chloride Flush (NORMAL SALINE FLUSH) 0.9 % SOLN       . sulfamethoxazole-trimethoprim (BACTRIM DS) 800-160 MG per tablet Take 1 tablet by mouth 2 (two) times a week. Mondays and Tuesdays, twice daily      . tacrolimus (PROGRAF) 0.5 MG capsule Take 1.5 mg by mouth 2 (two) times daily.      Marland Kitchen testosterone (ANDRODERM) 4 MG/24HR PT24 patch Place 1 patch onto the skin daily.  30 patch  3   No current facility-administered medications for this visit.   Facility-Administered Medications Ordered in Other Visits  Medication Dose Route Frequency Provider Last Rate Last Dose  . 0.9 %  sodium chloride infusion   Intravenous Continuous Catalina Gravel, PA 500 mL/hr at 01/18/13 1244    . sodium chloride 0.9 % injection 10 mL  10 mL Intravenous PRN Lowella Dell, MD   10 mL at 08/11/12 1606    OBJECTIVE: Middle-aged white male who appears fatigued but comfortable, and in no acute distress Filed Vitals:   01/18/13 1136  BP: 123/74  Pulse: 54  Temp: 98.3 F (36.8 C)  Resp: 20     Body mass index is 28.66 kg/(m^2).    Filed Weights   01/18/13 1136  Weight: 183 lb (83.008 kg)   ECOG FS: 2  Sclerae unicteric  Oropharynx clear with no lesions or thrush No cervical or supraclavicular adenopathy  Lungs clear to auscultation,  no rales or rhonchi  Heart regular rate and rhythm, no murmur appreciated Abd  +BS , soft, nontender to palpation  MSK   2+ bilateral ankle edema, equal bilaterally. No upper extremity edema noted Neuro: nonfocal, well oriented   LAB RESULTS:  Today's labs are pending, 01/18/2013.  CMV   Negative  11/04/2012   Negative  10/29/2012  Negative  10/21/2012  Negative  10/14/2012  Negative  10/06/2012  Negative  09/29/2012  Negative  09/24/2012  Negative   09/15/2012  Positive, <200  09/08/2012   Lab Results  Component Value Date   WBC  5.7 01/18/2013   NEUTROABS 3.2 01/18/2013   HGB 9.7* 01/18/2013   HCT 28.2* 01/18/2013   MCV 91.0 01/18/2013    PLT 169 01/18/2013      Chemistry      Component Value Date/Time   NA 136 01/18/2013 1125   NA 136 12/27/2012 0440   K 3.8 01/18/2013 1125   K 3.9 12/27/2012 0440   CL 106 01/18/2013 1125   CL 108 12/27/2012 0440   CO2 24 01/18/2013 1125   CO2 22 12/27/2012 0440   BUN 19.3 01/18/2013 1125   BUN 19 12/27/2012 0440   CREATININE 0.7 01/18/2013 1125   CREATININE 0.63 12/27/2012 0440      Component Value Date/Time   CALCIUM 7.8* 01/18/2013 1125   CALCIUM 6.9* 12/27/2012 0440   ALKPHOS 79 01/18/2013 1125   ALKPHOS 91 12/27/2012 0440   AST 17 01/18/2013 1125   AST 18 12/27/2012 0440   ALT 15 01/18/2013 1125   ALT 18 12/27/2012 0440   BILITOT 0.20 01/18/2013 1125   BILITOT 0.2* 12/27/2012 0440     IgG:  549 01/11/2013  LDH: 212 01/18/2013   Lipid Panel     Component Value Date/Time   CHOL 225* 08/20/2012 1341   TRIG 301* 08/20/2012 1341   HDL 23* 08/20/2012 1341   CHOLHDL 9.8 08/20/2012 1341   VLDL 60* 08/20/2012 1341   LDLCALC 142* 08/20/2012 1341    Magnesium was 1.7 on 01/11/2013.  Phosphorus was 3.2 on 01/04/2013.  Results for KACPER, CARTLIDGE (MRN [id number]) as of 10/14/2012 17:26  Ref. Range 07/21/2012 10:20 07/28/2012 15:22 08/04/2012 14:45 08/06/2012 15:10 08/11/2012 09:17  Tacrolimus Lvl Latest Range: 5.0-20.0 ng/mL 3.9 (L) 9.7 9.0 6.9 6.9  01/04/2013 8.1  STUDIES:  No recent studies.   ASSESSMENT: 67 y.o. [place] man with a history of well-differentiated lymphocytic lymphoma/ chronic lymphoid leukemia initially diagnosed in 2000, not requiring intervention until 2006; with multiple chromosomal abnormalities.  His treatment history is as follows:  (1) fludarabine/cyclophosphamide/rituximab x5 completed May 2007.   (2) rituximab for 8 doses October 2010, with partial response   (3) Leustatin and ofatumumab weekly x8 July to September 2011 followed by maintenance ofatumumab maintenance ofatumumab every 2 months, with initial response but rising counts September 2012   (4) status-post  unrelated donor stem-cell transplant 02/24/2012 at [facility]  (a) conditioning regimen consisted of fludarabine + TBI at 200 cGy, followed by rituximab x27;  (b) CMV reactivation x3 (patient CMV positive, donor negative), s/p ganciclovir treatment; 3d reactivation August 2013, s/p gancyclovir, with negative PCR mid-September 2013; last gancyclovir dose 10/06/2012 (c) Chronic GVHD: involving gut and skin, treated with steroids, tacrolimus and MMF.  MMF was eventually d/c'd and tacrolimus currently at a dose of 1.5mg  BID (d) atrial fibrillation: resolved on brief amiodarone regimen (e) steroid-induced myopathy: improving  (f) hypomagnesemia: resolved after d/c gancyclovir (g) hypogammaglobulinemia: s/p IVIG most recently 01/07/2013. (h) elevated triglycerides (606 on 07/14/2012)  (i) adrenal insufficiency: on prednisone and budesonide (j) pancytopenia, improved  (5) restaging studies September 2013  including CT scans, flow cytometry, and bone marrow biopsy, showed no evidence of residual chronic lymphoid leukemia.  (6) recurrent GVHD (skin rash, mouth changes, severe diarrhea and gastric/duodenal/colonic biopsies 11/17/2012 c/w GVHD grade 2) now only remaining sign is mild but bothersome diarrhea; c diff negative x3; continuing current regimen   (7) severe malnutrition -- on VITAL supplement in addition to regular diet; on Marinol for anorexia  (8) testosterone deficiency--on patch   (9) deconditioning: ongoing REHAB   (  10) mild dehydration: encouraged increased po fluids; may need IVF support intermittently   (11) steroid-induced osteoporosis with compression fractures: received pamidronate 12/18/2012   (12) nausea: improved on current meds  (13)  Diarrhea: improved on Questran PO BID   PLAN: This case was reviewed with Dr. Darnelle Catalan.    Kermitt will receive supportive IV fluids today, Thursday, and Saturday. I will see him again next week on 01/26/2013 for repeat labs, physical exam,  and supportive IV fluids..  We will plan on seeing him for labs, follow up and supportive fluids on a weekly basis for the next several weeks.  If he finds he is unable to keep himself well hydrated, we can also consider IV fluids at home through home health.  Thus far, however, he seems to be doing a better job hydrating on his own.  Lean will continue on all of his current medications, including the prednisone at 20 mg daily.  He is still quite weak and deconditioned, so he'll also continue to receive physical therapy.  Per his request we are placing a referral to [facility] for outpatient therapy.  Chaitanya is scheduled to be seen in [place] on 02/08/13.  [facility] and [place] both voice understanding and agreement with this plan and will call with any changes or problems.  Zollie Scale    01/18/2013

## 2013-01-18 NOTE — Telephone Encounter (Signed)
Sent michelle a staff message to add the pt in for his iv fluid appts.

## 2013-01-18 NOTE — Telephone Encounter (Signed)
Per staff message and POF I have scheduled appts.  JMW  

## 2013-01-19 ENCOUNTER — Telehealth: Payer: Self-pay | Admitting: Oncology

## 2013-01-19 NOTE — Telephone Encounter (Signed)
S/W THE PT'S WIFE AND SHE IS AWARE OF THE IV FLUID APPTS ON 01/20/2013 AND 01/22/2013 ALONG WITH THE PHYSICAL THERAPY APPT AT [facility] AND [facility] ON 01/25/2013. GVE THIS INFORMATION TO MED RECORDS FOR THEM TO FAX OVER THE NOTES,INSURANCE CARD DEMO TO THAT OFFICE AT [phone].

## 2013-01-20 ENCOUNTER — Ambulatory Visit (HOSPITAL_BASED_OUTPATIENT_CLINIC_OR_DEPARTMENT_OTHER): Payer: BC Managed Care – PPO

## 2013-01-20 VITALS — BP 125/81 | HR 86 | Temp 99.0°F

## 2013-01-20 DIAGNOSIS — R197 Diarrhea, unspecified: Secondary | ICD-10-CM

## 2013-01-20 DIAGNOSIS — E860 Dehydration: Secondary | ICD-10-CM

## 2013-01-20 DIAGNOSIS — D89811 Chronic graft-versus-host disease: Secondary | ICD-10-CM

## 2013-01-20 DIAGNOSIS — C9110 Chronic lymphocytic leukemia of B-cell type not having achieved remission: Secondary | ICD-10-CM

## 2013-01-20 MED ORDER — SODIUM CHLORIDE 0.9 % IV SOLN
500.0000 mL | Freq: Once | INTRAVENOUS | Status: AC
  Administered 2013-01-20: 500 mL via INTRAVENOUS

## 2013-01-20 NOTE — Patient Instructions (Signed)
Dehydration, Adult Dehydration is when you lose more fluids from the body than you take in. Vital organs like the kidneys, brain, and heart cannot function without a proper amount of fluids and salt. Any loss of fluids from the body can cause dehydration.  CAUSES   Vomiting.  Diarrhea.  Excessive sweating.  Excessive urine output.  Fever. SYMPTOMS  Mild dehydration  Thirst.  Dry lips.  Slightly dry mouth. Moderate dehydration  Very dry mouth.  Sunken eyes.  Skin does not bounce back quickly when lightly pinched and released.  Dark urine and decreased urine production.  Decreased tear production.  Headache. Severe dehydration  Very dry mouth.  Extreme thirst.  Rapid, weak pulse (more than 100 beats per minute at rest).  Cold hands and feet.  Not able to sweat in spite of heat and temperature.  Rapid breathing.  Blue lips.  Confusion and lethargy.  Difficulty being awakened.  Minimal urine production.  No tears. DIAGNOSIS  Your caregiver will diagnose dehydration based on your symptoms and your exam. Blood and urine tests will help confirm the diagnosis. The diagnostic evaluation should also identify the cause of dehydration. TREATMENT  Treatment of mild or moderate dehydration can often be done at home by increasing the amount of fluids that you drink. It is best to drink small amounts of fluid more often. Drinking too much at one time can make vomiting worse. Refer to the home care instructions below. Severe dehydration needs to be treated at the hospital where you will probably be given intravenous (IV) fluids that contain water and electrolytes. HOME CARE INSTRUCTIONS   Ask your caregiver about specific rehydration instructions.  Drink enough fluids to keep your urine clear or pale yellow.  Drink small amounts frequently if you have nausea and vomiting.  Eat as you normally do.  Avoid:  Foods or drinks high in sugar.  Carbonated  drinks.  Juice.  Extremely hot or cold fluids.  Drinks with caffeine.  Fatty, greasy foods.  Alcohol.  Tobacco.  Overeating.  Gelatin desserts.  Wash your hands well to avoid spreading bacteria and viruses.  Only take over-the-counter or prescription medicines for pain, discomfort, or fever as directed by your caregiver.  Ask your caregiver if you should continue all prescribed and over-the-counter medicines.  Keep all follow-up appointments with your caregiver. SEEK MEDICAL CARE IF:  You have abdominal pain and it increases or stays in one area (localizes).  You have a rash, stiff neck, or severe headache.  You are irritable, sleepy, or difficult to awaken.  You are weak, dizzy, or extremely thirsty. SEEK IMMEDIATE MEDICAL CARE IF:   You are unable to keep fluids down or you get worse despite treatment.  You have frequent episodes of vomiting or diarrhea.  You have blood or green matter (bile) in your vomit.  You have blood in your stool or your stool looks black and tarry.  You have not urinated in 6 to 8 hours, or you have only urinated a small amount of very dark urine.  You have a fever.  You faint. MAKE SURE YOU:   Understand these instructions.  Will watch your condition.  Will get help right away if you are not doing well or get worse. Document Released: 12/08/2005 Document Revised: 03/01/2012 Document Reviewed: 07/28/2011 Exit[employer] Patient Information 2013 [employer].  

## 2013-01-21 ENCOUNTER — Other Ambulatory Visit: Payer: Self-pay | Admitting: *Deleted

## 2013-01-22 ENCOUNTER — Ambulatory Visit (HOSPITAL_BASED_OUTPATIENT_CLINIC_OR_DEPARTMENT_OTHER): Payer: BC Managed Care – PPO

## 2013-01-22 VITALS — BP 120/74 | HR 72 | Temp 98.2°F

## 2013-01-22 DIAGNOSIS — E860 Dehydration: Secondary | ICD-10-CM

## 2013-01-22 DIAGNOSIS — C9110 Chronic lymphocytic leukemia of B-cell type not having achieved remission: Secondary | ICD-10-CM

## 2013-01-22 DIAGNOSIS — D89811 Chronic graft-versus-host disease: Secondary | ICD-10-CM

## 2013-01-22 DIAGNOSIS — R197 Diarrhea, unspecified: Secondary | ICD-10-CM

## 2013-01-22 MED ORDER — SODIUM CHLORIDE 0.9 % IV SOLN
INTRAVENOUS | Status: DC
  Administered 2013-01-22: 09:00:00 via INTRAVENOUS

## 2013-01-26 ENCOUNTER — Ambulatory Visit (HOSPITAL_BASED_OUTPATIENT_CLINIC_OR_DEPARTMENT_OTHER): Payer: BC Managed Care – PPO | Admitting: Physician Assistant

## 2013-01-26 ENCOUNTER — Encounter: Payer: Self-pay | Admitting: Physician Assistant

## 2013-01-26 ENCOUNTER — Telehealth: Payer: Self-pay | Admitting: *Deleted

## 2013-01-26 ENCOUNTER — Telehealth: Payer: Self-pay | Admitting: Oncology

## 2013-01-26 ENCOUNTER — Other Ambulatory Visit: Payer: Self-pay | Admitting: Physician Assistant

## 2013-01-26 ENCOUNTER — Other Ambulatory Visit (HOSPITAL_BASED_OUTPATIENT_CLINIC_OR_DEPARTMENT_OTHER): Payer: BC Managed Care – PPO | Admitting: Lab

## 2013-01-26 ENCOUNTER — Ambulatory Visit (HOSPITAL_BASED_OUTPATIENT_CLINIC_OR_DEPARTMENT_OTHER): Payer: BC Managed Care – PPO

## 2013-01-26 VITALS — BP 154/91 | HR 99 | Temp 98.4°F | Resp 20 | Ht 67.0 in | Wt 175.3 lb

## 2013-01-26 DIAGNOSIS — C9110 Chronic lymphocytic leukemia of B-cell type not having achieved remission: Secondary | ICD-10-CM

## 2013-01-26 DIAGNOSIS — E860 Dehydration: Secondary | ICD-10-CM

## 2013-01-26 DIAGNOSIS — D89811 Chronic graft-versus-host disease: Secondary | ICD-10-CM

## 2013-01-26 DIAGNOSIS — L290 Pruritus ani: Secondary | ICD-10-CM

## 2013-01-26 DIAGNOSIS — R197 Diarrhea, unspecified: Secondary | ICD-10-CM

## 2013-01-26 DIAGNOSIS — Z9489 Other transplanted organ and tissue status: Secondary | ICD-10-CM

## 2013-01-26 DIAGNOSIS — C8599 Non-Hodgkin lymphoma, unspecified, extranodal and solid organ sites: Secondary | ICD-10-CM

## 2013-01-26 HISTORY — DX: Hypomagnesemia: E83.42

## 2013-01-26 LAB — CBC WITH DIFFERENTIAL/PLATELET
BASO%: 0 % (ref 0.0–2.0)
Eosinophils Absolute: 0 10*3/uL (ref 0.0–0.5)
LYMPH%: 32.3 % (ref 14.0–49.0)
MCHC: 32.5 g/dL (ref 32.0–36.0)
MCV: 92.3 fL (ref 79.3–98.0)
MONO#: 0.5 10*3/uL (ref 0.1–0.9)
MONO%: 9.1 % (ref 0.0–14.0)
NEUT#: 3.2 10*3/uL (ref 1.5–6.5)
RBC: 3.37 10*6/uL — ABNORMAL LOW (ref 4.20–5.82)
RDW: 17.3 % — ABNORMAL HIGH (ref 11.0–14.6)
WBC: 5.4 10*3/uL (ref 4.0–10.3)
nRBC: 0 % (ref 0–0)

## 2013-01-26 LAB — COMPREHENSIVE METABOLIC PANEL (CC13)
ALT: 19 U/L (ref 0–55)
Albumin: 2.2 g/dL — ABNORMAL LOW (ref 3.5–5.0)
Alkaline Phosphatase: 82 U/L (ref 40–150)
CO2: 20 mEq/L — ABNORMAL LOW (ref 22–29)
Glucose: 101 mg/dl — ABNORMAL HIGH (ref 70–99)
Potassium: 4.1 mEq/L (ref 3.5–5.1)
Sodium: 136 mEq/L (ref 136–145)
Total Bilirubin: 0.32 mg/dL (ref 0.20–1.20)
Total Protein: 4.6 g/dL — ABNORMAL LOW (ref 6.4–8.3)

## 2013-01-26 LAB — LACTATE DEHYDROGENASE (CC13): LDH: 230 U/L (ref 125–245)

## 2013-01-26 MED ORDER — SODIUM CHLORIDE 0.9 % IJ SOLN
10.0000 mL | INTRAMUSCULAR | Status: DC | PRN
  Administered 2013-01-26: 10 mL via INTRAVENOUS
  Filled 2013-01-26: qty 10

## 2013-01-26 MED ORDER — SODIUM CHLORIDE 0.9 % IV SOLN
Freq: Once | INTRAVENOUS | Status: AC
  Administered 2013-01-26: 15:00:00 via INTRAVENOUS
  Filled 2013-01-26: qty 250

## 2013-01-26 MED ORDER — HEPARIN SOD (PORK) LOCK FLUSH 100 UNIT/ML IV SOLN
500.0000 [IU] | Freq: Once | INTRAVENOUS | Status: DC
  Filled 2013-01-26: qty 5

## 2013-01-26 MED ORDER — HEPARIN SOD (PORK) LOCK FLUSH 100 UNIT/ML IV SOLN
250.0000 [IU] | Freq: Once | INTRAVENOUS | Status: AC
  Administered 2013-01-26: 250 [IU] via INTRAVENOUS
  Filled 2013-01-26: qty 5

## 2013-01-26 MED ORDER — DIPHENOXYLATE-ATROPINE 2.5-0.025 MG PO TABS
2.0000 | ORAL_TABLET | Freq: Once | ORAL | Status: AC
  Administered 2013-01-26: 2 via ORAL

## 2013-01-26 MED ORDER — LIDOCAINE-HYDROCORTISONE ACE 3-0.5 % RE CREA: 1.0000 | TOPICAL_CREAM | Freq: Two times a day (BID) | RECTAL | Status: DC

## 2013-01-26 MED ORDER — SODIUM CHLORIDE 0.9 % IV SOLN
INTRAVENOUS | Status: DC
  Administered 2013-01-26: 13:00:00 via INTRAVENOUS

## 2013-01-26 NOTE — Progress Notes (Signed)
ID: Timothy [name]   DOB: Feb 08, 1946  MR#: [id number]  NWG#:[id number]  HISTORY OF PRESENT ILLNESS: We have very complete records from Dr. Sydnee Levans in [place], and in summary:  The patient was initially diagnosed in August 2000, with a white cell count of 23,600, but normal hemoglobin and platelets, and no significant symptomatology. Over the next several years his white cell count drifted up, and he eventually developed some symptoms of night sweats in particular, leading to treatment with fludarabine, Cytoxan and rituxan for five cycles given between December 2006 and May 2007.  We have CT scans from June 2006, November 2006 and April 2007, and comparing the November 2006 and April 2007 scans, there was near complete response. He had subsequent therapy in [place] as detailed below, but with decreased response, leading to allogeneic stem-cell transplant at the [facility] 02/24/2012.  INTERVAL HISTORY: Timothy [name] returns with his wife Timothy [name]  for followup of his CLL, status post allogeneic stem cell transplant in [place] March 2013.   Emran has been receiving regular supportive IV fluids for the past few weeks, and feels like his energy level continues to improve. He continues to receive physical therapy through  [facility] which he finds helpful.   [name]'s biggest complaint continues to be diarrhea which can occur as many as 8-10 times daily. This often means a couple larger stools, followed by very small stools. They tend to be very watery and loose. He has been using Questran twice daily, and has also added Lomotil.   He denies any blood or mucus in the stool.  He has developed perianal irritation for which he has been using a prescription cream called "Boston Butt Cream"  which is apparently a combination of lidocaine and hydrocortisone. He's had no abdominal pain. He also denies any fevers, chills, or night sweats.  His appetite has improved and he feels like he is eating  and drinking well despite the fact that he has lost several more pounds this week. He attributes this not only to the diarrhea, but also the fact that edema in his legs has improved significantly. His feet and ankles are still swollen, but the legs themselves have improved.     REVIEW OF SYSTEMS: Add  has no new rashes, and the current skin changes on his face continue to improve. He's had no additional oral ulcerations or oral sensitivity, and has no problems swallowing. He denies signs of abnormal bleeding. He has had no recent nausea and no emesis. He denies any cough, phlegm production, or increased shortness of breath and has had no chest pain. He continues to feel weak and fatigued. He denies any abnormal headaches. He has mild pain which is treated very effectively with an occasional dose of hydrocodone/APAP.   A detailed review of systems is otherwise stable and noncontributory.   PAST MEDICAL HISTORY: Past Medical History  Diagnosis Date  . 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 Diverticular disease   . Hyperlipidemia   . Obesity   . Hypertension   . Hiatal hernia   . CMV (cytomegalovirus) antibody positive     pre-transplant, with seroconversion x2 pst-transplant  . Right bundle branch block     pre-transplant  . CKD (chronic kidney disease) stage 2, GFR 60-89 ml/min   . Pancytopenia   . Steroid-induced diabetes   . Atrial fibrillation     post-transplant  . Myopathy   . Fine tremor     likely secondary to  tacrolimus  . Leukemia, chronic lymphoid   . 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 Diarrhea in adult patient 12/05/2012    Due to active GVHD  . CLL (chronic lymphocytic leukemia) 12/05/2012    Dx 07/1999; started Rx 12/06  AlloBMT 3/13  . Rash of face 12/05/2012    Due to GVHD    PAST SURGICAL HISTORY: Past Surgical History  Procedure Date  . Tonsillectomy and  adenoidectomy   . Bone marrow transplant   . Flexible sigmoidoscopy 11/17/2012    Procedure: FLEXIBLE SIGMOIDOSCOPY;  Surgeon: Petra Kuba, MD;  Location: [facility];  Service: Endoscopy;  Laterality: N/A;  Dr Ewing Schlein states will be admitted to rooom 1339 11/16/12  . Esophagogastroduodenoscopy 11/17/2012    Procedure: ESOPHAGOGASTRODUODENOSCOPY (EGD);  Surgeon: Petra Kuba, MD;  Location: [facility];  Service: Endoscopy;  Laterality: N/A;    FAMILY HISTORY Family History  Problem Relation Age of Onset  . Cancer Father    The patient's father died from complications of chronic lymphocytic leukemia at the age of 43.  It had been diagnosed seven years before when he was 49.  The patient is enrolled in a familial chronic lymphocytic leukemia study out of the [facility].  The patient's mother is 59, alive, unfortunately suffering with dementia, and he has a brother, 16, who is otherwise in fair health.   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  His wife of >40 years, Timothy [name], is a homemaker.  Their daughter, Timothy [name], lives in [place].  She also is a Futures trader.  The patient has an 67 year old grandson and an 67-year-old granddaughter, and that is really the main reason he moved to this area.  He is a International aid/development worker.     ADVANCED DIRECTIVES:  HEALTH MAINTENANCE: History  Substance Use Topics  . Smoking status: Never Smoker   . Smokeless tobacco: Never Used  . Alcohol Use: No     Colonoscopy:  PAP:  Bone density:  Lipid panel:  Allergies  Allergen Reactions  . Benadryl (Diphenhydramine Hcl)     "Restless leg syndrome"    Current Outpatient Prescriptions  Medication Sig Dispense Refill  . acyclovir (ZOVIRAX) 400 MG tablet Take 1 tablet (400 mg total) by mouth 2 (two) times daily.  60 tablet  2  . antiseptic oral rinse (BIOTENE) LIQD 15 mLs by  Mouth Rinse route 2 times daily at 12 noon and 4 pm.  237 mL  6  . budesonide (ENTOCORT EC) 3 MG 24 hr capsule Take 3 capsules (9 mg total) by mouth every morning.  90 capsule  6  . Calcium Citrate (CITRACAL PO) Take 1,200 mg by mouth 2 (two) times daily.      . cholecalciferol (VITAMIN D) 1000 UNITS tablet Take 1,000 Units by mouth every evening.       . cholestyramine (QUESTRAN) 4 G packet Take 1 packet by mouth 2 (two) times daily with a meal.  60 each  1  . diphenoxylate-atropine (LOMOTIL) 2.5-0.025 MG per tablet Take 1 tablet by mouth 4 (four) times daily as needed for diarrhea or loose stools.  30 tablet  1  . dronabinol (MARINOL) 2.5 MG capsule Take 1 capsule (2.5 mg total) by mouth 2 (two) times daily before lunch and supper.  60 capsule  0  . fluconazole (DIFLUCAN) 100 MG tablet  Take 1 tablet (100 mg total) by mouth every morning.  30 tablet  0  . HYDROcodone-acetaminophen (NORCO) 10-325 MG per tablet Take 1 tablet by mouth daily with breakfast.  30 tablet  0  . labetalol (NORMODYNE) 200 MG tablet Take 1 tablet (200 mg total) by mouth 2 (two) times daily.  60 tablet  12  . lidocaine-hydrocortisone (ANAMANTEL HC) 3-0.5 % CREA Place 1 Applicatorful rectally 2 (two) times daily.  85 g  1  . Multiple Vitamins-Minerals (CENTRUM SILVER ADULT 50+) TABS Take 1 tablet by mouth every evening.       . Nutritional Supplements (FEEDING SUPPLEMENT, VITAL 1.5 CAL,) LIQD Take 237 mLs by mouth 2 (two) times daily.  60 Can  6  . omeprazole (PRILOSEC) 40 MG capsule Take 40 mg by mouth daily.      . predniSONE (DELTASONE) 20 MG tablet Take 1 tablet (20 mg total) by mouth daily with breakfast.  30 tablet  1  . prochlorperazine (COMPAZINE) 10 MG tablet Take 10 mg by mouth every 6 (six) hours as needed. Nausea      . protein supplement (RESOURCE BENEPROTEIN) POWD Take 6 g by mouth 3 (three) times daily with meals.      . saccharomyces boulardii (FLORASTOR) 250 MG capsule Take 2 capsules (500 mg total) by mouth 2  (two) times daily. Probiotic  120 capsule  1  . sertraline (ZOLOFT) 50 MG tablet Take 50-100 mg by mouth daily. Takes 50 mg one day and 100 mg the next day      . sulfamethoxazole-trimethoprim (BACTRIM DS) 800-160 MG per tablet Take 1 tablet by mouth 2 (two) times a week. Mondays and Tuesdays, twice daily      . tacrolimus (PROGRAF) 0.5 MG capsule Take 1.5 mg by mouth 2 (two) times daily.      Marland Kitchen testosterone (ANDRODERM) 4 MG/24HR PT24 patch Place 1 patch onto the skin daily.  30 patch  3  . levofloxacin (LEVAQUIN) 500 MG tablet       . lisinopril (PRINIVIL,ZESTRIL) 10 MG tablet       . meclizine (ANTIVERT) 25 MG tablet Take 25 mg by mouth Twice daily as needed. For dizziness      . ondansetron (ZOFRAN-ODT) 8 MG disintegrating tablet Take 8 mg by mouth 2 (two) times daily as needed. For nausea      . pantoprazole (PROTONIX) 40 MG tablet       . psyllium (HYDROCIL/METAMUCIL) 95 % PACK Take 1 packet by mouth daily.  56 each    . Sodium Chloride Flush (NORMAL SALINE FLUSH) 0.9 % SOLN        Current Facility-Administered Medications  Medication Dose Route Frequency Provider Last Rate Last Dose  . diphenoxylate-atropine (LOMOTIL) 2.5-0.025 MG per tablet 2 tablet  2 tablet Oral Once Hubert Derstine Allegra Grana, PA       Facility-Administered Medications Ordered in Other Visits  Medication Dose Route Frequency Provider Last Rate Last Dose  . sodium chloride 0.9 % injection 10 mL  10 mL Intravenous PRN Lowella Dell, MD   10 mL at 08/11/12 1606    OBJECTIVE: Middle-aged white male who appears fatigued but comfortable, and in no acute distress Filed Vitals:   01/26/13 1134  BP: 154/91  Pulse: 99  Temp: 98.4 F (36.9 C)  Resp: 20     Body mass index is 27.46 kg/(m^2).    Filed Weights   01/26/13 1134  Weight: 175 lb 4.8 oz (79.516 kg)   ECOG  FS: 2  Sclerae unicteric  Oropharynx clear with no lesions or thrush No cervical or supraclavicular adenopathy  Lungs clear to auscultation,  no rales or  rhonchi  Heart regular rate and rhythm Abd  +BS , soft, nontender to palpation  MSK   2+ bilateral ankle edema, equal bilaterally. No upper extremity edema noted Neuro: nonfocal, well oriented Good skin turgor Very mild residual erythema noted on the face, but no actual skin rash.   LAB RESULTS:  Today's labs are pending, 01/26/2013.  CMV    Negative  11/04/2012   Negative  10/29/2012  Negative  10/21/2012  Negative  10/14/2012  Negative  10/06/2012  Negative  09/29/2012  Negative  09/24/2012  Negative   09/15/2012  Positive, <200  09/08/2012   Lab Results  Component Value Date   WBC 5.4 01/26/2013   NEUTROABS 3.2 01/26/2013   HGB 10.1* 01/26/2013   HCT 31.1* 01/26/2013   MCV 92.3 01/26/2013   PLT 191 01/26/2013      Chemistry      Component Value Date/Time   NA 136 01/18/2013 1125   NA 136 12/27/2012 0440   K 3.8 01/18/2013 1125   K 3.9 12/27/2012 0440   CL 106 01/18/2013 1125   CL 108 12/27/2012 0440   CO2 24 01/18/2013 1125   CO2 22 12/27/2012 0440   BUN 19.3 01/18/2013 1125   BUN 19 12/27/2012 0440   CREATININE 0.7 01/18/2013 1125   CREATININE 0.63 12/27/2012 0440      Component Value Date/Time   CALCIUM 7.8* 01/18/2013 1125   CALCIUM 6.9* 12/27/2012 0440   ALKPHOS 79 01/18/2013 1125   ALKPHOS 91 12/27/2012 0440   AST 17 01/18/2013 1125   AST 18 12/27/2012 0440   ALT 15 01/18/2013 1125   ALT 18 12/27/2012 0440   BILITOT 0.20 01/18/2013 1125   BILITOT 0.2* 12/27/2012 0440     IgG:  549 01/11/2013  LDH: 212 01/18/2013   Lipid Panel     Component Value Date/Time   CHOL 225* 08/20/2012 1341   TRIG 301* 08/20/2012 1341   HDL 23* 08/20/2012 1341   CHOLHDL 9.8 08/20/2012 1341   VLDL 60* 08/20/2012 1341   LDLCALC 142* 08/20/2012 1341    Magnesium was 1.7 on 01/11/2013.  Phosphorus was 3.2 on 01/04/2013.  Results for HEIDI, LEMAY (MRN [id number]) as of 10/14/2012 17:26  Ref. Range 07/21/2012 10:20 07/28/2012 15:22 08/04/2012 14:45 08/06/2012 15:10 08/11/2012 09:17  Tacrolimus Lvl Latest Range:  5.0-20.0 ng/mL 3.9 (L) 9.7 9.0 6.9 6.9  01/04/2013 8.1  STUDIES:  No recent studies.   ASSESSMENT: 67 y.o. [place] man with a history of well-differentiated lymphocytic lymphoma/ chronic lymphoid leukemia initially diagnosed in 2000, not requiring intervention until 2006; with multiple chromosomal abnormalities.  His treatment history is as follows:  (1) fludarabine/cyclophosphamide/rituximab x5 completed May 2007.   (2) rituximab for 8 doses October 2010, with partial response   (3) Leustatin and ofatumumab weekly x8 July to September 2011 followed by maintenance ofatumumab maintenance ofatumumab every 2 months, with initial response but rising counts September 2012   (4) status-post unrelated donor stem-cell transplant 02/24/2012 at [facility]  (a) conditioning regimen consisted of fludarabine + TBI at 200 cGy, followed by rituximab x27;  (b) CMV reactivation x3 (patient CMV positive, donor negative), s/p ganciclovir treatment; 3d reactivation August 2013, s/p gancyclovir, with negative PCR mid-September 2013; last gancyclovir dose 10/06/2012 (c) Chronic GVHD: involving gut and skin, treated with steroids, tacrolimus and MMF.  MMF was  eventually d/c'd and tacrolimus currently at a dose of 1.5mg  BID (d) atrial fibrillation: resolved on brief amiodarone regimen (e) steroid-induced myopathy: improving  (f) hypomagnesemia: resolved after d/c gancyclovir (g) hypogammaglobulinemia: s/p IVIG most recently 01/07/2013. (h) elevated triglycerides (606 on 07/14/2012)  (i) adrenal insufficiency: on prednisone and budesonide (j) pancytopenia, improved  (5) restaging studies September 2013  including CT scans, flow cytometry, and bone marrow biopsy, showed no evidence of residual chronic lymphoid leukemia.  (6) recurrent GVHD (skin rash, mouth changes, severe diarrhea and gastric/duodenal/colonic biopsies 11/17/2012 c/w GVHD grade 2) now only remaining sign is bothersome diarrhea; c diff  negative x3; continuing current regimen   (7) severe malnutrition -- on VITAL supplement in addition to regular diet; on Marinol for anorexia  (8) testosterone deficiency--on patch   (9) deconditioning: ongoing REHAB   (10) mild dehydration: encouraged increased po fluids; receives IVF support intermittently   (11) steroid-induced osteoporosis with compression fractures: received pamidronate 12/18/2012   (12) nausea: improved on current meds  (13)  Diarrhea:  on Questran PO BID and Lomotil   PLAN: This case was reviewed with Dr. Darnelle Catalan.  Daleon will receive supportive IV fluids today and again on Saturday. I encouraged him to continue drinking plenty of fluids at home as well.  He will continue on Questran and Lomotil as needed to control diarrhea, but will let us know if he is unable to keep it in check.  I have also given him a new prescription for hydrocortisone/lidocaine rectal cream for the perianal irritation associated with the diarrhea.  Huriel will return next week to see Dr. Darnelle Catalan on February 11.  He is scheduled to be seen in [place] on 02/08/13, and we'll see Dr. Darnelle Catalan again when he returns in February 25 to review his plan.  Aurther Loft and Roselle both voice understanding and agreement with this plan and will call with any changes or problems.  Zollie Scale    01/26/2013

## 2013-01-26 NOTE — Telephone Encounter (Signed)
Per staff message and POF I have scheduled appts.  JMW  

## 2013-01-26 NOTE — Progress Notes (Signed)
Timothy [name]'s lab results showed a magnesium level of 1.0 today. This has been reviewed with Timothy [name] as well as our pharmacist, and we will give him IV magnesium today and tomorrow.  Timothy [name] has already received his 1000 mL of normal saline today. We'll give him an additional 250 mL with 2 g of magnesium infused over 2 hours today.  He will return tomorrow and will receive 500 mL of normal saline with 4 g of magnesium infused over 4 hours.  He will receive normal saline only on Saturday, 01/29/2013, and when he returns to see Timothy [name] next week on February 11, we'll plan on rechecking his magnesium. He is scheduled for both IV fluids and 2 g of magnesium on February 11.  Timothy Scale, PA-C 01/26/2013

## 2013-01-26 NOTE — Patient Instructions (Addendum)
Dehydration, Adult Dehydration is when you lose more fluids from the body than you take in. Vital organs like the kidneys, brain, and heart cannot function without a proper amount of fluids and salt. Any loss of fluids from the body can cause dehydration.  CAUSES   Vomiting.  Diarrhea.  Excessive sweating.  Excessive urine output.  Fever. SYMPTOMS  Mild dehydration  Thirst.  Dry lips.  Slightly dry mouth. Moderate dehydration  Very dry mouth.  Sunken eyes.  Skin does not bounce back quickly when lightly pinched and released.  Dark urine and decreased urine production.  Decreased tear production.  Headache. Severe dehydration  Very dry mouth.  Extreme thirst.  Rapid, weak pulse (more than 100 beats per minute at rest).  Cold hands and feet.  Not able to sweat in spite of heat and temperature.  Rapid breathing.  Blue lips.  Confusion and lethargy.  Difficulty being awakened.  Minimal urine production.  No tears. DIAGNOSIS  Your caregiver will diagnose dehydration based on your symptoms and your exam. Blood and urine tests will help confirm the diagnosis. The diagnostic evaluation should also identify the cause of dehydration. TREATMENT  Treatment of mild or moderate dehydration can often be done at home by increasing the amount of fluids that you drink. It is best to drink small amounts of fluid more often. Drinking too much at one time can make vomiting worse. Refer to the home care instructions below. Severe dehydration needs to be treated at the hospital where you will probably be given intravenous (IV) fluids that contain water and electrolytes. HOME CARE INSTRUCTIONS   Ask your caregiver about specific rehydration instructions.  Drink enough fluids to keep your urine clear or pale yellow.  Drink small amounts frequently if you have nausea and vomiting.  Eat as you normally do.  Avoid:  Foods or drinks high in sugar.  Carbonated  drinks.  Juice.  Extremely hot or cold fluids.  Drinks with caffeine.  Fatty, greasy foods.  Alcohol.  Tobacco.  Overeating.  Gelatin desserts.  Wash your hands well to avoid spreading bacteria and viruses.  Only take over-the-counter or prescription medicines for pain, discomfort, or fever as directed by your caregiver.  Ask your caregiver if you should continue all prescribed and over-the-counter medicines.  Keep all follow-up appointments with your caregiver. SEEK MEDICAL CARE IF:  You have abdominal pain and it increases or stays in one area (localizes).  You have a rash, stiff neck, or severe headache.  You are irritable, sleepy, or difficult to awaken.  You are weak, dizzy, or extremely thirsty. SEEK IMMEDIATE MEDICAL CARE IF:   You are unable to keep fluids down or you get worse despite treatment.  You have frequent episodes of vomiting or diarrhea.  You have blood or green matter (bile) in your vomit.  You have blood in your stool or your stool looks black and tarry.  You have not urinated in 6 to 8 hours, or you have only urinated a small amount of very dark urine.  You have a fever.  You faint. MAKE SURE YOU:   Understand these instructions.  Will watch your condition.  Will get help right away if you are not doing well or get worse. Document Released: 12/08/2005 Document Revised: 03/01/2012 Document Reviewed: 07/28/2011 [facility] Patient Information 2013 [place], [place].  Also received Magnesium IV over 2 hours.

## 2013-01-27 ENCOUNTER — Ambulatory Visit (HOSPITAL_BASED_OUTPATIENT_CLINIC_OR_DEPARTMENT_OTHER): Payer: BC Managed Care – PPO

## 2013-01-27 LAB — TACROLIMUS LEVEL: Tacrolimus Lvl: 7.8 ng/mL (ref 5.0–20.0)

## 2013-01-27 MED ORDER — SODIUM CHLORIDE 0.9 % IV SOLN
Freq: Once | INTRAVENOUS | Status: AC
  Administered 2013-01-27: 12:00:00 via INTRAVENOUS
  Filled 2013-01-27: qty 500

## 2013-01-27 MED ORDER — SODIUM CHLORIDE 0.9 % IJ SOLN
10.0000 mL | INTRAMUSCULAR | Status: DC | PRN
  Administered 2013-01-27: 10 mL via INTRAVENOUS
  Filled 2013-01-27: qty 10

## 2013-01-27 MED ORDER — HEPARIN SOD (PORK) LOCK FLUSH 100 UNIT/ML IV SOLN
500.0000 [IU] | Freq: Once | INTRAVENOUS | Status: AC
  Administered 2013-01-27: 500 [IU] via INTRAVENOUS
  Filled 2013-01-27: qty 5

## 2013-01-29 ENCOUNTER — Ambulatory Visit (HOSPITAL_BASED_OUTPATIENT_CLINIC_OR_DEPARTMENT_OTHER): Payer: BC Managed Care – PPO

## 2013-01-29 VITALS — BP 140/72 | HR 76 | Temp 97.1°F

## 2013-01-29 DIAGNOSIS — D89811 Chronic graft-versus-host disease: Secondary | ICD-10-CM

## 2013-01-29 DIAGNOSIS — E860 Dehydration: Secondary | ICD-10-CM

## 2013-01-29 DIAGNOSIS — C9110 Chronic lymphocytic leukemia of B-cell type not having achieved remission: Secondary | ICD-10-CM

## 2013-01-29 DIAGNOSIS — R197 Diarrhea, unspecified: Secondary | ICD-10-CM

## 2013-01-29 MED ORDER — HEPARIN SOD (PORK) LOCK FLUSH 100 UNIT/ML IV SOLN
500.0000 [IU] | Freq: Once | INTRAVENOUS | Status: AC
  Administered 2013-01-29: 250 [IU] via INTRAVENOUS
  Filled 2013-01-29: qty 5

## 2013-01-29 MED ORDER — SODIUM CHLORIDE 0.9 % IJ SOLN
10.0000 mL | INTRAMUSCULAR | Status: DC | PRN
  Administered 2013-01-29: 10 mL via INTRAVENOUS
  Filled 2013-01-29: qty 10

## 2013-01-29 MED ORDER — SODIUM CHLORIDE 0.9 % IV SOLN
INTRAVENOUS | Status: DC
  Administered 2013-01-29: 09:00:00 via INTRAVENOUS

## 2013-01-29 NOTE — Patient Instructions (Addendum)
Dehydration, Adult Dehydration is when you lose more fluids from the body than you take in. Vital organs like the kidneys, brain, and heart cannot function without a proper amount of fluids and salt. Any loss of fluids from the body can cause dehydration.  CAUSES   Vomiting.  Diarrhea.  Excessive sweating.  Excessive urine output.  Fever. SYMPTOMS  Mild dehydration  Thirst.  Dry lips.  Slightly dry mouth. Moderate dehydration  Very dry mouth.  Sunken eyes.  Skin does not bounce back quickly when lightly pinched and released.  Dark urine and decreased urine production.  Decreased tear production.  Headache. Severe dehydration  Very dry mouth.  Extreme thirst.  Rapid, weak pulse (more than 100 beats per minute at rest).  Cold hands and feet.  Not able to sweat in spite of heat and temperature.  Rapid breathing.  Blue lips.  Confusion and lethargy.  Difficulty being awakened.  Minimal urine production.  No tears. DIAGNOSIS  Your caregiver will diagnose dehydration based on your symptoms and your exam. Blood and urine tests will help confirm the diagnosis. The diagnostic evaluation should also identify the cause of dehydration. TREATMENT  Treatment of mild or moderate dehydration can often be done at home by increasing the amount of fluids that you drink. It is best to drink small amounts of fluid more often. Drinking too much at one time can make vomiting worse. Refer to the home care instructions below. Severe dehydration needs to be treated at the hospital where you will probably be given intravenous (IV) fluids that contain water and electrolytes. HOME CARE INSTRUCTIONS   Ask your caregiver about specific rehydration instructions.  Drink enough fluids to keep your urine clear or pale yellow.  Drink small amounts frequently if you have nausea and vomiting.  Eat as you normally do.  Avoid:  Foods or drinks high in sugar.  Carbonated  drinks.  Juice.  Extremely hot or cold fluids.  Drinks with caffeine.  Fatty, greasy foods.  Alcohol.  Tobacco.  Overeating.  Gelatin desserts.  Wash your hands well to avoid spreading bacteria and viruses.  Only take over-the-counter or prescription medicines for pain, discomfort, or fever as directed by your caregiver.  Ask your caregiver if you should continue all prescribed and over-the-counter medicines.  Keep all follow-up appointments with your caregiver. SEEK MEDICAL CARE IF:  You have abdominal pain and it increases or stays in one area (localizes).  You have a rash, stiff neck, or severe headache.  You are irritable, sleepy, or difficult to awaken.  You are weak, dizzy, or extremely thirsty. SEEK IMMEDIATE MEDICAL CARE IF:   You are unable to keep fluids down or you get worse despite treatment.  You have frequent episodes of vomiting or diarrhea.  You have blood or green matter (bile) in your vomit.  You have blood in your stool or your stool looks black and tarry.  You have not urinated in 6 to 8 hours, or you have only urinated a small amount of very dark urine.  You have a fever.  You faint. MAKE SURE YOU:   Understand these instructions.  Will watch your condition.  Will get help right away if you are not doing well or get worse. Document Released: 12/08/2005 Document Revised: 03/01/2012 Document Reviewed: 07/28/2011 Exit[employer] Patient Information 2013 [employer].  

## 2013-02-01 ENCOUNTER — Other Ambulatory Visit (HOSPITAL_BASED_OUTPATIENT_CLINIC_OR_DEPARTMENT_OTHER): Payer: BC Managed Care – PPO | Admitting: Lab

## 2013-02-01 ENCOUNTER — Telehealth: Payer: Self-pay | Admitting: Oncology

## 2013-02-01 ENCOUNTER — Ambulatory Visit (HOSPITAL_BASED_OUTPATIENT_CLINIC_OR_DEPARTMENT_OTHER): Payer: BC Managed Care – PPO | Admitting: Oncology

## 2013-02-01 ENCOUNTER — Ambulatory Visit (HOSPITAL_BASED_OUTPATIENT_CLINIC_OR_DEPARTMENT_OTHER): Payer: BC Managed Care – PPO

## 2013-02-01 VITALS — BP 135/81 | HR 87 | Temp 98.3°F | Resp 20 | Ht 67.0 in | Wt 179.8 lb

## 2013-02-01 DIAGNOSIS — C9110 Chronic lymphocytic leukemia of B-cell type not having achieved remission: Secondary | ICD-10-CM

## 2013-02-01 DIAGNOSIS — D89811 Chronic graft-versus-host disease: Secondary | ICD-10-CM

## 2013-02-01 DIAGNOSIS — D801 Nonfamilial hypogammaglobulinemia: Secondary | ICD-10-CM

## 2013-02-01 DIAGNOSIS — R197 Diarrhea, unspecified: Secondary | ICD-10-CM

## 2013-02-01 DIAGNOSIS — Z9489 Other transplanted organ and tissue status: Secondary | ICD-10-CM

## 2013-02-01 DIAGNOSIS — T8609 Other complications of bone marrow transplant: Secondary | ICD-10-CM

## 2013-02-01 DIAGNOSIS — E860 Dehydration: Secondary | ICD-10-CM

## 2013-02-01 LAB — LACTATE DEHYDROGENASE (CC13): LDH: 190 U/L (ref 125–245)

## 2013-02-01 LAB — COMPREHENSIVE METABOLIC PANEL (CC13)
Albumin: 2.3 g/dL — ABNORMAL LOW (ref 3.5–5.0)
Alkaline Phosphatase: 78 U/L (ref 40–150)
CO2: 24 mEq/L (ref 22–29)
Calcium: 8.5 mg/dL (ref 8.4–10.4)
Chloride: 109 mEq/L — ABNORMAL HIGH (ref 98–107)
Glucose: 142 mg/dl — ABNORMAL HIGH (ref 70–99)
Potassium: 4.5 mEq/L (ref 3.5–5.1)
Sodium: 139 mEq/L (ref 136–145)
Total Protein: 4.7 g/dL — ABNORMAL LOW (ref 6.4–8.3)

## 2013-02-01 LAB — CBC WITH DIFFERENTIAL/PLATELET
Basophils Absolute: 0 10*3/uL (ref 0.0–0.1)
Eosinophils Absolute: 0 10*3/uL (ref 0.0–0.5)
HGB: 9.1 g/dL — ABNORMAL LOW (ref 13.0–17.1)
MONO#: 0.5 10*3/uL (ref 0.1–0.9)
NEUT#: 3.3 10*3/uL (ref 1.5–6.5)
RDW: 16.5 % — ABNORMAL HIGH (ref 11.0–14.6)
lymph#: 1.6 10*3/uL (ref 0.9–3.3)

## 2013-02-01 LAB — MAGNESIUM (CC13): Magnesium: 1.6 mg/dl (ref 1.5–2.5)

## 2013-02-01 MED ORDER — SODIUM CHLORIDE 0.9 % IV SOLN
INTRAVENOUS | Status: DC
  Administered 2013-02-01: 14:00:00 via INTRAVENOUS
  Filled 2013-02-01: qty 250

## 2013-02-01 MED ORDER — SODIUM CHLORIDE 0.9 % IV SOLN
Freq: Once | INTRAVENOUS | Status: DC
  Filled 2013-02-01: qty 250

## 2013-02-01 MED ORDER — SODIUM CHLORIDE 0.9 % IV SOLN
Freq: Once | INTRAVENOUS | Status: AC
  Administered 2013-02-01: 16:00:00 via INTRAVENOUS

## 2013-02-01 NOTE — Progress Notes (Signed)
ID: Timothy [name]   DOB: 1946-09-02  MR#: [id number]  WUJ#:[id number]  NFA:OZHY,Q DOUGLAS, MD SU: OTHER MD:  HISTORY OF PRESENT ILLNESS: We have very complete records from Dr. Sydnee Levans in [place], and in summary:  The patient was initially diagnosed in August 2000, with a white cell count of 23,600, but normal hemoglobin and platelets, and no significant symptomatology. Over the next several years his white cell count drifted up, and he eventually developed some symptoms of night sweats in particular, leading to treatment with fludarabine, Cytoxan and rituxan for five cycles given between December 2006 and May 2007.  We have CT scans from June 2006, November 2006 and April 2007, and comparing the November 2006 and April 2007 scans, there was near complete response. He had subsequent therapy in [place] as detailed below, but with decreased response, leading to allogeneic stem-cell transplant at the [facility] 02/24/2012.  INTERVAL HISTORY: Aramis returns with his wife [name]  for followup of his CLL, status post allogeneic stem cell transplant in [place] March 2013.     REVIEW OF SYSTEMS: Timothy [name] continues to "turn the corner" from his graft-versus-host problems. He still has 3 bowel movements daily, but they are now well formed although still soft. He is using Questran twice daily and 2 Lomotil 3 times a day at specific times (not related to the bowel movements themselves). He is not having accidents, but he has "very little time" sometimes it to get to the bathroom and this concerns him with the upcoming trip to [place]. His feet continue to swell but he hates to use the compression stockings. He does promise to wear them out while in flight. He continues to have back pain where he is known to have compression fracture. He takes one half of a hydrocodone pill in the morning. He has had no fevers, no rash, no bleeding, no itching, no problems in his mouth, and is continuing to  exercise. He walks at least 15 minutes daily and then he does his physical therapy exercises. A detailed review of systems today was otherwise noncontributory  PAST MEDICAL HISTORY: Past Medical History  Diagnosis Date  . 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 Diverticular disease   . Hyperlipidemia   . Obesity   . Hypertension   . Hiatal hernia   . CMV (cytomegalovirus) antibody positive     pre-transplant, with seroconversion x2 pst-transplant  . Right bundle branch block     pre-transplant  . CKD (chronic kidney disease) stage 2, GFR 60-89 ml/min   . Pancytopenia   . Steroid-induced diabetes   . Atrial fibrillation     post-transplant  . Myopathy   . 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 Leukemia, chronic lymphoid   . 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 Diarrhea in adult patient 12/05/2012    Due to active GVHD  . CLL (chronic lymphocytic leukemia) 12/05/2012    Dx 07/1999; started Rx 12/06  AlloBMT 3/13  . Rash of face 12/05/2012    Due to GVHD  . Hypomagnesemia 01/26/2013    PAST SURGICAL HISTORY: Past Surgical History  Procedure Laterality Date  . Tonsillectomy and adenoidectomy    . Bone marrow transplant    . Flexible sigmoidoscopy  11/17/2012    Procedure: FLEXIBLE SIGMOIDOSCOPY;  Surgeon: Petra Kuba, MD;  Location: [facility];  Service: Endoscopy;  Laterality: N/A;  Dr Ewing Schlein  states will be admitted to rooom 1339 11/16/12  . Esophagogastroduodenoscopy  11/17/2012    Procedure: ESOPHAGOGASTRODUODENOSCOPY (EGD);  Surgeon: Petra Kuba, MD;  Location: [facility];  Service: Endoscopy;  Laterality: N/A;    FAMILY HISTORY Family History  Problem Relation Age of Onset  . Cancer Father    The patient's father died from complications of chronic lymphocytic leukemia at the age of 81.  It had been diagnosed seven years before when he was 43.  The  patient is enrolled in a familial chronic lymphocytic leukemia study out of the [facility].  The patient's mother is 51, alive, unfortunately suffering with dementia, and he has a brother, 29, who is otherwise in fair health.   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  His wife of >40 years, [name], is a homemaker.  Their daughter, [name], lives in [place].  She also is a Futures trader.  The patient has an 2 year old grandson and an 79-year-old granddaughter, and that is really the main reason he moved to this area.  He is a International aid/development worker.     ADVANCED DIRECTIVES:  HEALTH MAINTENANCE: History  Substance Use Topics  . Smoking status: Never Smoker   . Smokeless tobacco: Never Used  . Alcohol Use: No     Colonoscopy:  PSA:  Bone density:  Lipid panel:  Allergies  Allergen Reactions  . Benadryl (Diphenhydramine Hcl)     "Restless leg syndrome"    Current Outpatient Prescriptions  Medication Sig Dispense Refill  . acyclovir (ZOVIRAX) 400 MG tablet Take 1 tablet (400 mg total) by mouth 2 (two) times daily.  60 tablet  2  . antiseptic oral rinse (BIOTENE) LIQD 15 mLs by Mouth Rinse route 2 times daily at 12 noon and 4 pm.  237 mL  6  . budesonide (ENTOCORT EC) 3 MG 24 hr capsule Take 3 capsules (9 mg total) by mouth every morning.  90 capsule  6  . Calcium Citrate (CITRACAL PO) Take 1,200 mg by mouth 2 (two) times daily.      . cholecalciferol (VITAMIN D) 1000 UNITS tablet Take 1,000 Units by mouth every evening.       . cholestyramine (QUESTRAN) 4 G packet Take 1 packet by mouth 2 (two) times daily with a meal.  60 each  1  . diphenoxylate-atropine (LOMOTIL) 2.5-0.025 MG per tablet Take 1 tablet by mouth 4 (four) times daily as needed for diarrhea or loose stools.  30 tablet  1  . dronabinol (MARINOL) 2.5 MG capsule Take 1 capsule (2.5 mg total) by mouth 2  (two) times daily before lunch and supper.  60 capsule  0  . fluconazole (DIFLUCAN) 100 MG tablet Take 1 tablet (100 mg total) by mouth every morning.  30 tablet  0  . HYDROcodone-acetaminophen (NORCO) 10-325 MG per tablet Take 1 tablet by mouth daily with breakfast.  30 tablet  0  . labetalol (NORMODYNE) 200 MG tablet Take 1 tablet (200 mg total) by mouth 2 (two) times daily.  60 tablet  12  . levofloxacin (LEVAQUIN) 500 MG tablet       . lidocaine-hydrocortisone (ANAMANTEL HC) 3-0.5 % CREA Place 1 Applicatorful rectally 2 (two) times daily.  85 g  1  . lisinopril (PRINIVIL,ZESTRIL) 10 MG tablet       . meclizine (ANTIVERT) 25 MG tablet Take 25 mg by mouth Twice daily  as needed. For dizziness      . Multiple Vitamins-Minerals (CENTRUM SILVER ADULT 50+) TABS Take 1 tablet by mouth every evening.       . Nutritional Supplements (FEEDING SUPPLEMENT, VITAL 1.5 CAL,) LIQD Take 237 mLs by mouth 2 (two) times daily.  60 Can  6  . omeprazole (PRILOSEC) 40 MG capsule Take 40 mg by mouth daily.      . ondansetron (ZOFRAN-ODT) 8 MG disintegrating tablet Take 8 mg by mouth 2 (two) times daily as needed. For nausea      . pantoprazole (PROTONIX) 40 MG tablet       . predniSONE (DELTASONE) 20 MG tablet Take 1 tablet (20 mg total) by mouth daily with breakfast.  30 tablet  1  . prochlorperazine (COMPAZINE) 10 MG tablet Take 10 mg by mouth every 6 (six) hours as needed. Nausea      . protein supplement (RESOURCE BENEPROTEIN) POWD Take 6 g by mouth 3 (three) times daily with meals.      . psyllium (HYDROCIL/METAMUCIL) 95 % PACK Take 1 packet by mouth daily.  56 each    . saccharomyces boulardii (FLORASTOR) 250 MG capsule Take 2 capsules (500 mg total) by mouth 2 (two) times daily. Probiotic  120 capsule  1  . sertraline (ZOLOFT) 50 MG tablet Take 50-100 mg by mouth daily. Takes 50 mg one day and 100 mg the next day      . Sodium Chloride Flush (NORMAL SALINE FLUSH) 0.9 % SOLN       .  sulfamethoxazole-trimethoprim (BACTRIM DS) 800-160 MG per tablet Take 1 tablet by mouth 2 (two) times a week. Mondays and Tuesdays, twice daily      . tacrolimus (PROGRAF) 0.5 MG capsule Take 1.5 mg by mouth 2 (two) times daily.      Marland Kitchen testosterone (ANDRODERM) 4 MG/24HR PT24 patch Place 1 patch onto the skin daily.  30 patch  3   No current facility-administered medications for this visit.   Facility-Administered Medications Ordered in Other Visits  Medication Dose Route Frequency Provider Last Rate Last Dose  . sodium chloride 0.9 % injection 10 mL  10 mL Intravenous PRN Lowella Dell, MD   10 mL at 08/11/12 1606    OBJECTIVE: Middle-aged white male in no acute distress Filed Vitals:   02/01/13 1212  BP: 135/81  Pulse: 87  Temp: 98.3 F (36.8 C)  Resp: 20     Body mass index is 28.15 kg/(m^2).    Filed Weights   02/01/13 1212  Weight: 179 lb 12.8 oz (81.557 kg)   ECOG FS: 1  Sclerae unicteric  Oropharynx clear and moist No cervical or supraclavicular adenopathy  Lungs clear to auscultation, fair excursion Heart regular rate and rhythm Abd  Obese, +BS , soft, nontender MSK   2+ bilateral ankle edema. No erythema. No upper extremity edema noted Neuro: nonfocal, well oriented, positive affect Skin: normal turgor, no rash  LAB RESULTS:      Chemistry      Component Value Date/Time   NA 139 02/01/2013 1206   NA 136 12/27/2012 0440   K 4.5 02/01/2013 1206   K 3.9 12/27/2012 0440   CL 109* 02/01/2013 1206   CL 108 12/27/2012 0440   CO2 24 02/01/2013 1206   CO2 22 12/27/2012 0440   BUN 29.3* 02/01/2013 1206   BUN 19 12/27/2012 0440   CREATININE 0.8 02/01/2013 1206   CREATININE 0.63 12/27/2012 0440      Component  Value Date/Time   CALCIUM 8.5 02/01/2013 1206   CALCIUM 6.9* 12/27/2012 0440   ALKPHOS 78 02/01/2013 1206   ALKPHOS 91 12/27/2012 0440   AST 15 02/01/2013 1206   AST 18 12/27/2012 0440   ALT 19 02/01/2013 1206   ALT 18 12/27/2012 0440   BILITOT 0.23 02/01/2013 1206   BILITOT  0.2* 12/27/2012 0440     IgG:  549 01/11/2013  LDH: 212 01/18/2013  Results for BAYLOR, CORTEZ (MRN [id number]) as of 02/01/2013 13:06  Ref. Range 12/06/2012 09:55 12/07/2012 05:30 12/08/2012 09:50 01/04/2013 14:37 01/26/2013 11:17  Tacrolimus Lvl Latest Range: 5.0-20.0 ng/mL 5.1 45.5 (H) 7.5 8.1 7.8    Lipid Panel     Component Value Date/Time   CHOL 225* 08/20/2012 1341   TRIG 301* 08/20/2012 1341   HDL 23* 08/20/2012 1341   CHOLHDL 9.8 08/20/2012 1341   VLDL 60* 08/20/2012 1341   LDLCALC 142* 08/20/2012 1341    Magnesium was 1.0 on 01/11/2013, 1.6 on 2/11  Phosphorus was 3.2 on 01/04/2013.  Results for DSHAUN, REPPUCCI (MRN [id number]) as of 10/14/2012 17:26  Ref. Range 07/21/2012 10:20 07/28/2012 15:22 08/04/2012 14:45 08/06/2012 15:10 08/11/2012 09:17  Tacrolimus Lvl Latest Range: 5.0-20.0 ng/mL 3.9 (L) 9.7 9.0 6.9 6.9    STUDIES:  No results found.  ASSESSMENT: 67 y.o. [place] man with a history of well-differentiated lymphocytic lymphoma/ chronic lymphoid leukemia initially diagnosed in 2000, not requiring intervention until 2006; with multiple chromosomal abnormalities.  His treatment history is as follows:  (1) fludarabine/cyclophosphamide/rituximab x5 completed May 2007.   (2) rituximab for 8 doses October 2010, with partial response   (3) Leustatin and ofatumumab weekly x8 July to September 2011 followed by maintenance ofatumumab maintenance ofatumumab every 2 months, with initial response but rising counts September 2012   (4) status-post unrelated donor stem[facility]cell transplant 02/24/2012 [facility]  (a) conditioning regimen consisted of fludarabine + TBI at 200 cGy, followed by rituximab x27;  (b) CMV reactivation x3 (patient CMV positive, donor negative), s/p ganciclovir treatment; 3d reactivation August 2013, s/p gancyclovir, with negative PCR mid-September 2013; last gancyclovir dose 10/06/2012 (c) Chronic GVHD: involving gut and skin, treated with steroids, tacrolimus  and MMF.  MMF was eventually d/c'd and tacrolimus currently at a dose of 1.5mg  BID (d) atrial fibrillation: resolved on brief amiodarone regimen (e) steroid-induced myopathy: improving  (f) hypomagnesemia: improved after d/c gancyclovir (g) hypogammaglobulinemia: s/p IVIG most recently 01/07/2013. (h) history of elevated triglycerides (606 on 07/14/2012)  (i) adrenal insufficiency: on prednisone and budesonide (j) pancytopenia, improved  (5) restaging studies September 2013  including CT scans, flow cytometry, and bone marrow biopsy, showed no evidence of residual chronic lymphoid leukemia.  (6) recurrent GVHD (skin rash, mouth changes, severe diarrhea and gastric/duodenal/colonic biopsies 11/17/2012 c/w GVHD grade 2) now only remaining sign is bothersome diarrhea; c diff negative x3; continuing current regimen   (7) severe malnutrition -- on VITAL supplement in addition to regular diet; on Marinol for anorexia  (8) testosterone deficiency--on patch   (9) deconditioning: ongoing REHAB   (10) mild dehydration: encouraged increased po fluids; receives IVF support intermittently   (11) steroid-induced osteoporosis with compression fractures: received pamidronate 12/18/2012   (12) nausea: improved on current meds  (13) diarrhea:  on Questran PO BID and Lomotil 2 po TID   PLAN: [place] is working, walking more and doing his Rehab. Aside from the fatigue, his main problem continues to be the diarrhea, but it is better. She will receive magnesium today and again  on February 15, just before his long trip to [place]. He was planning on not eating at all that day so he wouldn't have to be worried about bowel movements, but there is enough time in between flights for him to be able to eat something light and of course he needs to continue to hydrate himself progressively. He understands the importance of wearing the compression stockings during the flight. He is a ready scheduled to return here on  February 25, and we will resume followup at that time. He is very hopeful that the results of the restaging studies in [place] will be favorable.  Marland KitchenMAGRINAT,GUSTAV C    02/01/2013

## 2013-02-01 NOTE — Telephone Encounter (Signed)
gv pt appt schedule for February. °

## 2013-02-05 ENCOUNTER — Ambulatory Visit (HOSPITAL_BASED_OUTPATIENT_CLINIC_OR_DEPARTMENT_OTHER): Payer: BC Managed Care – PPO

## 2013-02-05 MED ORDER — SODIUM CHLORIDE 0.9 % IV SOLN
INTRAVENOUS | Status: DC
  Administered 2013-02-05: 09:00:00 via INTRAVENOUS

## 2013-02-05 MED ORDER — HEPARIN SOD (PORK) LOCK FLUSH 100 UNIT/ML IV SOLN
500.0000 [IU] | Freq: Once | INTRAVENOUS | Status: AC
  Administered 2013-02-05: 250 [IU] via INTRAVENOUS
  Filled 2013-02-05: qty 5

## 2013-02-05 MED ORDER — SODIUM CHLORIDE 0.9 % IJ SOLN
10.0000 mL | INTRAMUSCULAR | Status: DC | PRN
Start: 2013-02-05 — End: 2013-02-05
  Administered 2013-02-05: 10 mL via INTRAVENOUS
  Filled 2013-02-05: qty 10

## 2013-02-05 MED ORDER — SODIUM CHLORIDE 0.9 % IJ SOLN
10.0000 mL | INTRAMUSCULAR | Status: DC | PRN
  Administered 2013-02-05: 10 mL via INTRAVENOUS
  Filled 2013-02-05: qty 10

## 2013-02-05 NOTE — Patient Instructions (Signed)
Hypomagnesemia  You received Magnesium 2gm IV over 2 hours today  Magnesium is a common ion (mineral) in the body which is needed for metabolism. It is about how the body handles food and other chemical reactions necessary for life. Only about 2% of the magnesium in our body is found in the blood. When this is low, it is called hypomagnesemia. The blood will measure only a tiny amount of the magnesium in our body. When it is low in our blood, it does not mean that the whole body supply is low. The normal serum concentration ranges from 1.8-2.5 mEq/L. When the level gets to be less than 1.0 mEq/L, a number of problems begin to happen.  CAUSES   Receiving intravenous fluids without magnesium replacement.  Loss of magnesium from the bowel by naso-gastric suction.  Loss of magnesium from nausea and vomiting or severe diarrhea. Any of the inflammatory bowel conditions can cause this.  Abuse of alcohol often leads to low serum magnesium.  An inherited form of magnesium loss happens when the kidneys lose magnesium. This is called familial or primary hypomagnesemia.  Some medications such as diuretics also cause the loss of magnesium. SYMPTOMS  These following problems are worse if the changes in magnesium levels come on suddenly.  Tremor.  Confusion.  Muscle weakness.  Over-sensitive to sights and sounds.  Sensitive reflexes.  Depression.  Muscular fibrillations.  Over-reactivity of the nerves.  Irritability.  Psychosis.  Spasms of the hand muscles.  Tetany (where the muscles go into uncontrollable spasms). DIAGNOSIS  This condition can be diagnosed by blood tests. TREATMENT   In emergency, magnesium can be given intravenously (by vein).  If the condition is less worrisome, it can be corrected by diet. High levels of magnesium are found in green leafy vegetables, peas, beans and nuts among other things. It can also be given through medications by mouth.  If it is being  caused by medications, changes can be made.  If alcohol is a problem, help is available if there are difficulties giving it up. Document Released: 09/03/2005 Document Revised: 03/01/2012 Document Reviewed: 07/28/2008 [facility] Patient Information 2013 [place], [place].

## 2013-02-07 ENCOUNTER — Telehealth: Payer: Self-pay | Admitting: Oncology

## 2013-02-07 NOTE — Telephone Encounter (Signed)
Changed the time of 2/25 appt due to GM on call. s/w wife and per wife appts for 2/25 cx'd due to pt in [place] receiving further tx after his transplant. Per wife she will call us when pt returns. Message to desk nurse. Wife then called back and stated Brenda would keep appts on 2/25. appts reinstated and wife given new time for 2/25 @ 11am.

## 2013-02-08 LAB — PULMONARY FUNCTION TEST

## 2013-02-15 ENCOUNTER — Ambulatory Visit: Payer: Self-pay | Admitting: Oncology

## 2013-02-15 ENCOUNTER — Ambulatory Visit (HOSPITAL_BASED_OUTPATIENT_CLINIC_OR_DEPARTMENT_OTHER): Payer: BC Managed Care – PPO

## 2013-02-15 ENCOUNTER — Ambulatory Visit: Payer: Self-pay

## 2013-02-15 ENCOUNTER — Other Ambulatory Visit (HOSPITAL_BASED_OUTPATIENT_CLINIC_OR_DEPARTMENT_OTHER): Payer: BC Managed Care – PPO | Admitting: Lab

## 2013-02-15 ENCOUNTER — Telehealth: Payer: Self-pay | Admitting: Oncology

## 2013-02-15 ENCOUNTER — Other Ambulatory Visit: Payer: Self-pay | Admitting: Lab

## 2013-02-15 ENCOUNTER — Ambulatory Visit (HOSPITAL_BASED_OUTPATIENT_CLINIC_OR_DEPARTMENT_OTHER): Payer: BC Managed Care – PPO | Admitting: Oncology

## 2013-02-15 VITALS — BP 130/78 | HR 74 | Temp 98.3°F | Resp 20 | Ht 67.0 in | Wt 166.2 lb

## 2013-02-15 DIAGNOSIS — K449 Diaphragmatic hernia without obstruction or gangrene: Secondary | ICD-10-CM

## 2013-02-15 DIAGNOSIS — R768 Other specified abnormal immunological findings in serum: Secondary | ICD-10-CM

## 2013-02-15 DIAGNOSIS — D89811 Chronic graft-versus-host disease: Secondary | ICD-10-CM

## 2013-02-15 DIAGNOSIS — D849 Immunodeficiency, unspecified: Secondary | ICD-10-CM

## 2013-02-15 DIAGNOSIS — C9110 Chronic lymphocytic leukemia of B-cell type not having achieved remission: Secondary | ICD-10-CM

## 2013-02-15 DIAGNOSIS — R112 Nausea with vomiting, unspecified: Secondary | ICD-10-CM

## 2013-02-15 DIAGNOSIS — R21 Rash and other nonspecific skin eruption: Secondary | ICD-10-CM

## 2013-02-15 DIAGNOSIS — D801 Nonfamilial hypogammaglobulinemia: Secondary | ICD-10-CM

## 2013-02-15 DIAGNOSIS — D61818 Other pancytopenia: Secondary | ICD-10-CM

## 2013-02-15 DIAGNOSIS — E860 Dehydration: Secondary | ICD-10-CM

## 2013-02-15 DIAGNOSIS — Z9489 Other transplanted organ and tissue status: Secondary | ICD-10-CM

## 2013-02-15 DIAGNOSIS — I4891 Unspecified atrial fibrillation: Secondary | ICD-10-CM

## 2013-02-15 LAB — CBC WITH DIFFERENTIAL/PLATELET
Basophils Absolute: 0 10*3/uL (ref 0.0–0.1)
Eosinophils Absolute: 0 10*3/uL (ref 0.0–0.5)
HCT: 31.6 % — ABNORMAL LOW (ref 38.4–49.9)
HGB: 10.1 g/dL — ABNORMAL LOW (ref 13.0–17.1)
MCV: 95.2 fL (ref 79.3–98.0)
NEUT#: 3.6 10*3/uL (ref 1.5–6.5)
NEUT%: 67.1 % (ref 39.0–75.0)
RDW: 14.7 % — ABNORMAL HIGH (ref 11.0–14.6)
lymph#: 1.5 10*3/uL (ref 0.9–3.3)

## 2013-02-15 LAB — COMPREHENSIVE METABOLIC PANEL (CC13)
ALT: 23 U/L (ref 0–55)
CO2: 23 mEq/L (ref 22–29)
Calcium: 8.6 mg/dL (ref 8.4–10.4)
Chloride: 107 mEq/L (ref 98–107)
Glucose: 186 mg/dl — ABNORMAL HIGH (ref 70–99)
Sodium: 139 mEq/L (ref 136–145)
Total Bilirubin: 0.3 mg/dL (ref 0.20–1.20)
Total Protein: 5.2 g/dL — ABNORMAL LOW (ref 6.4–8.3)

## 2013-02-15 LAB — LACTATE DEHYDROGENASE (CC13): LDH: 234 U/L (ref 125–245)

## 2013-02-15 MED ORDER — SULFAMETHOXAZOLE-TMP DS 800-160 MG PO TABS: 1.0000 | ORAL_TABLET | ORAL | Status: DC

## 2013-02-15 MED ORDER — ACYCLOVIR 400 MG PO TABS: 800.0000 mg | ORAL_TABLET | Freq: Two times a day (BID) | ORAL | Status: DC

## 2013-02-15 MED ORDER — SERTRALINE HCL 50 MG PO TABS: ORAL_TABLET | ORAL | Status: DC

## 2013-02-15 MED ORDER — PREDNISONE 20 MG PO TABS: 20.0000 mg | ORAL_TABLET | Freq: Every day | ORAL | Status: DC

## 2013-02-15 MED ORDER — FLUCONAZOLE 100 MG PO TABS: 100.0000 mg | ORAL_TABLET | Freq: Every morning | ORAL | Status: DC

## 2013-02-15 MED ORDER — SODIUM CHLORIDE 0.9 % IV SOLN: INTRAVENOUS | Status: DC

## 2013-02-15 MED ORDER — SODIUM CHLORIDE 0.9 % IV SOLN
Freq: Once | INTRAVENOUS | Status: AC
  Administered 2013-02-15: 13:00:00 via INTRAVENOUS
  Filled 2013-02-15: qty 500

## 2013-02-15 MED ORDER — HYDROCODONE-ACETAMINOPHEN 10-325 MG PO TABS: 1.0000 | ORAL_TABLET | Freq: Every day | ORAL | Status: DC

## 2013-02-15 NOTE — Telephone Encounter (Signed)
gv wife appt schedule for February thru March. Per AB appt w/her for wk of 3/13 was scheduled for 3/10 and appt for wk of 3/20 was scheduled for 3/19 according to availability.

## 2013-02-15 NOTE — Progress Notes (Signed)
ID: Timothy [name]   DOB: [birth year]  MR#: [id number]  WUJ#:[id number]  NFA:OZHY,Q DOUGLAS, MD SU: OTHER MD: Donzetta Starch  HISTORY OF PRESENT ILLNESS: We have very complete records from Dr. Sydnee Levans in [place], and in summary:  The patient was initially diagnosed in August 2000, with a white cell count of 23,600, but normal hemoglobin and platelets, and no significant symptomatology. Over the next several years his white cell count drifted up, and he eventually developed some symptoms of night sweats in particular, leading to treatment with fludarabine, Cytoxan and rituxan for five cycles given between December 2006 and May 2007.  We have CT scans from June 2006, November 2006 and April 2007, and comparing the November 2006 and April 2007 scans, there was near complete response. He had subsequent therapy in [place] as detailed below, but with decreased response, leading to allogeneic stem-cell transplant at the [facility] 02/24/2012.  INTERVAL HISTORY: Timothy [name] returns with his wife Timothy [name]  for followup of his CLL, status post allogeneic stem cell transplant in [place] March 2013. Since his last visit here, he traveled to [facility] (February 18 through the 22nd". He did very well on the trip itself, with no "accidents" either way. He was restaged there with a bone marrow aspirate which she tells me showed no evidence of recurrent leukemia, all cells being donor cells. He had some scans which showed an enlarged lymph node near the liver hilum, which will need followup. A repeat scan in 3 months has been suggested. He will return to [place] in 6 months.    REVIEW OF SYSTEMS: He tells me his bowel movements are still soft but now formed. He was chewing on sugarless gum which contains sorbitol and so I advised him to stay away from that. He is still using the cholestyramine with good success. He is walking more around the house, and walking up stairs with some difficulty. He still doing  his rehabilitation exercises regularly. Outside of the house he still uses his ski poles. There has been no fever, no nausea or vomiting, no unusual headaches, and no rash. He describes himself is moderately fatigued. He has some low back pain. Otherwise a detailed review of systems today was noncontributory  PAST MEDICAL HISTORY: Past Medical History  Diagnosis Date  . 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 Diverticular disease   . Hyperlipidemia   . Obesity   . Hypertension   . Hiatal hernia   . CMV (cytomegalovirus) antibody positive     pre-transplant, with seroconversion x2 pst-transplant  . Right bundle branch block     pre-transplant  . CKD (chronic kidney disease) stage 2, GFR 60-89 ml/min   . Pancytopenia   . Steroid-induced diabetes   . Atrial fibrillation     post-transplant  . Myopathy   . 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 Leukemia, chronic lymphoid   . 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 Diarrhea in adult patient 12/05/2012    Due to active GVHD  . CLL (chronic lymphocytic leukemia) 12/05/2012    Dx 07/1999; started Rx 12/06  AlloBMT 3/13  . Rash of face 12/05/2012    Due to GVHD  . Hypomagnesemia 01/26/2013    PAST SURGICAL HISTORY: Past Surgical History  Procedure Laterality Date  . Tonsillectomy and adenoidectomy    . Bone marrow transplant    . Flexible sigmoidoscopy  11/17/2012  Procedure: FLEXIBLE SIGMOIDOSCOPY;  Surgeon: Petra Kuba, MD;  Location: [facility];  Service: Endoscopy;  Laterality: N/A;  Dr Ewing Schlein states will be admitted to rooom 1339 11/16/12  . Esophagogastroduodenoscopy  11/17/2012    Procedure: ESOPHAGOGASTRODUODENOSCOPY (EGD);  Surgeon: Petra Kuba, MD;  Location: [facility];  Service: Endoscopy;  Laterality: N/A;    FAMILY HISTORY Family History  Problem Relation Age of Onset  . Cancer Father    The  patient's father died from complications of chronic lymphocytic leukemia at the age of 69.  It had been diagnosed seven years before when he was 69.  The patient is enrolled in a familial chronic lymphocytic leukemia study out of the [employer] [facility] [employer].  The patient's mother is 35, alive, unfortunately suffering with dementia, and he has a brother, 67, who is otherwise in fair health.   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  His wife of >40 years, Timothy [name], is a homemaker.  Their daughter, Timothy [name], lives in [place].  She also is a Futures trader.  The patient has an 47 year old grandson and an 17-year-old granddaughter, and that is really the main reason he moved to this area.  He is a International aid/development worker.     ADVANCED DIRECTIVES:  HEALTH MAINTENANCE: History  Substance Use Topics  . Smoking status: Never Smoker   . Smokeless tobacco: Never Used  . Alcohol Use: No     Colonoscopy:  PSA:  Bone density:  Lipid panel:  Allergies  Allergen Reactions  . Benadryl (Diphenhydramine Hcl)     "Restless leg syndrome"    Current Outpatient Prescriptions  Medication Sig Dispense Refill  . acyclovir (ZOVIRAX) 400 MG tablet Take 2 tablets (800 mg total) by mouth 2 (two) times daily.  60 tablet  12  . antiseptic oral rinse (BIOTENE) LIQD 15 mLs by Mouth Rinse route 2 times daily at 12 noon and 4 pm.  237 mL  6  . budesonide (ENTOCORT EC) 3 MG 24 hr capsule Take 3 capsules (9 mg total) by mouth every morning.  90 capsule  6  . Calcium Citrate (CITRACAL PO) Take 1,200 mg by mouth 2 (two) times daily.      . cholecalciferol (VITAMIN D) 1000 UNITS tablet Take 1,000 Units by mouth every evening.       . cholestyramine (QUESTRAN) 4 G packet Take 1 packet by mouth 2 (two) times daily with a meal.  60 each  1  . diphenoxylate-atropine (LOMOTIL) 2.5-0.025 MG per tablet Take 1 tablet by mouth  4 (four) times daily as needed for diarrhea or loose stools.  30 tablet  1  . dronabinol (MARINOL) 2.5 MG capsule Take 1 capsule (2.5 mg total) by mouth 2 (two) times daily before lunch and supper.  60 capsule  0  . fluconazole (DIFLUCAN) 100 MG tablet Take 1 tablet (100 mg total) by mouth every morning.  30 tablet  12  . HYDROcodone-acetaminophen (NORCO) 10-325 MG per tablet Take 1 tablet by mouth daily with breakfast.  30 tablet  0  . labetalol (NORMODYNE) 200 MG tablet Take 1 tablet (200 mg total) by mouth 2 (two) times daily.  60 tablet  12  . levofloxacin (LEVAQUIN) 500 MG tablet       . lidocaine-hydrocortisone (ANAMANTEL HC) 3-0.5 % CREA Place 1 Applicatorful rectally 2 (two) times daily.  85 g  1  . lisinopril (PRINIVIL,ZESTRIL)  10 MG tablet       . meclizine (ANTIVERT) 25 MG tablet Take 25 mg by mouth Twice daily as needed. For dizziness      . Multiple Vitamins-Minerals (CENTRUM SILVER ADULT 50+) TABS Take 1 tablet by mouth every evening.       . Nutritional Supplements (FEEDING SUPPLEMENT, VITAL 1.5 CAL,) LIQD Take 237 mLs by mouth 2 (two) times daily.  60 Can  6  . omeprazole (PRILOSEC) 40 MG capsule Take 40 mg by mouth daily.      . ondansetron (ZOFRAN-ODT) 8 MG disintegrating tablet Take 8 mg by mouth 2 (two) times daily as needed. For nausea      . pantoprazole (PROTONIX) 40 MG tablet       . predniSONE (DELTASONE) 20 MG tablet Take 1 tablet (20 mg total) by mouth daily with breakfast.  60 tablet  12  . prochlorperazine (COMPAZINE) 10 MG tablet Take 10 mg by mouth every 6 (six) hours as needed. Nausea      . protein supplement (RESOURCE BENEPROTEIN) POWD Take 6 g by mouth 3 (three) times daily with meals.      . psyllium (HYDROCIL/METAMUCIL) 95 % PACK Take 1 packet by mouth daily.  56 each    . saccharomyces boulardii (FLORASTOR) 250 MG capsule Take 2 capsules (500 mg total) by mouth 2 (two) times daily. Probiotic  120 capsule  1  . sertraline (ZOLOFT) 50 MG tablet Takes 50 mg one  day and 100 mg the next day  90 tablet  3  . Sodium Chloride Flush (NORMAL SALINE FLUSH) 0.9 % SOLN       . sulfamethoxazole-trimethoprim (BACTRIM DS) 800-160 MG per tablet Take 1 tablet by mouth 2 (two) times a week. Mondays and Tuesdays, twice daily  30 tablet  12  . tacrolimus (PROGRAF) 0.5 MG capsule Take 1.5 mg by mouth 2 (two) times daily.      Marland Kitchen testosterone (ANDRODERM) 4 MG/24HR PT24 patch Place 1 patch onto the skin daily.  30 patch  3   No current facility-administered medications for this visit.   Facility-Administered Medications Ordered in Other Visits  Medication Dose Route Frequency Provider Last Rate Last Dose  . sodium chloride 0.9 % 500 mL with magnesium sulfate 1 g infusion   Intravenous Once Lowella Dell, MD      . sodium chloride 0.9 % injection 10 mL  10 mL Intravenous PRN Lowella Dell, MD   10 mL at 08/11/12 1606    OBJECTIVE: Middle-aged white male in no acute distress Filed Vitals:   02/15/13 1142  BP: 130/78  Pulse: 74  Temp: 98.3 F (36.8 C)  Resp: 20     Body mass index is 26.02 kg/(m^2).    Filed Weights   02/15/13 1142  Weight: 166 lb 3.2 oz (75.388 kg)   ECOG FS: 1  Sclerae unicteric  Oropharynx clear and moist No cervical or supraclavicular adenopathy  Lungs clear to auscultation, fair excursion Heart regular rate and rhythm, no murmur appreciated Abd  obese, +BS , soft, nontender, no masses palpated MSK   1+ bilateral ankle edema. No upper extremity edema noted Neuro: nonfocal, well oriented, positive affect Skin: normal turgor, no rash; there is a scalp lesion which looks like a 1 x 0.5 cm squamous cell carcinoma.  LAB RESULTS:      Chemistry      Component Value Date/Time   NA 139 02/01/2013 1206   NA 136 12/27/2012 0440  K 4.5 02/01/2013 1206   K 3.9 12/27/2012 0440   CL 109* 02/01/2013 1206   CL 108 12/27/2012 0440   CO2 24 02/01/2013 1206   CO2 22 12/27/2012 0440   BUN 29.3* 02/01/2013 1206   BUN 19 12/27/2012 0440    CREATININE 0.8 02/01/2013 1206   CREATININE 0.63 12/27/2012 0440      Component Value Date/Time   CALCIUM 8.5 02/01/2013 1206   CALCIUM 6.9* 12/27/2012 0440   ALKPHOS 78 02/01/2013 1206   ALKPHOS 91 12/27/2012 0440   AST 15 02/01/2013 1206   AST 18 12/27/2012 0440   ALT 19 02/01/2013 1206   ALT 18 12/27/2012 0440   BILITOT 0.23 02/01/2013 1206   BILITOT 0.2* 12/27/2012 0440      STUDIES:  No results found.   ASSESSMENT: 67 y.o. [place] man with a history of well-differentiated lymphocytic lymphoma/ chronic lymphoid leukemia initially diagnosed in 2000, not requiring intervention until 2006; with multiple chromosomal abnormalities.  His treatment history is as follows:  (1) fludarabine/cyclophosphamide/rituximab x5 completed May 2007.   (2) rituximab for 8 doses October 2010, with partial response   (3) Leustatin and ofatumumab weekly x8 July to September 2011 followed by maintenance ofatumumab maintenance ofatumumab every 2 months, with initial response but rising counts September 2012   (4) status-post unrelated donor stem-cell transplant 02/24/2012 at the [facility]  (a) conditioning regimen consisted of fludarabine + TBI at 200 cGy, followed by rituximab x27;  (b) CMV reactivation x3 (patient CMV positive, donor negative), s/p ganciclovir treatment; 3d reactivation August 2013, s/p gancyclovir, with negative PCR mid-September 2013; last gancyclovir dose 10/06/2012 (c) Chronic GVHD: involving gut and skin, treated with steroids, tacrolimus and MMF.  MMF was eventually d/c'd and tacrolimus currently at a dose of 1.5mg  BID (d) atrial fibrillation: resolved on brief amiodarone regimen (e) steroid-induced myopathy: improving  (f) hypomagnesemia: improved after d/c gancyclovir (g) hypogammaglobulinemia: s/p IVIG most recently 01/07/2013. (h) history of elevated triglycerides (606 on 07/14/2012)  (i) adrenal insufficiency: on prednisone and budesonide (j) pancytopenia, improved  (5) restaging  studies September 2013  including CT scans, flow cytometry, and bone marrow biopsy, showed no evidence of residual chronic lymphoid leukemia.  (6) recurrent GVHD (skin rash, mouth changes, severe diarrhea and gastric/duodenal/colonic biopsies 11/17/2012 c/w GVHD grade 2) now only remaining sign is bothersome diarrhea; c diff negative x3; continuing current regimen   (7) severe malnutrition -- on VITAL supplement in addition to regular diet; on Marinol for anorexia  (8) testosterone deficiency--on patch   (9) deconditioning: ongoing REHAB   (10) mild dehydration: encouraged increased po fluids; receives IVF support w magnesium every Mon, Th and Sat  (11) steroid-induced osteoporosis with compression fractures: received pamidronate 12/18/2012   (12) nausea: improved on current meds  PLAN: Jarred has lost quite a bit of weight, and axilla looks good at present, although I would not want him to lose much more. We talked about nutrition issues. I also encouraged him to increase his exercise rgimen as tolerated. We refilled what medications had expired, and updated his medication list.  He she will need a repeat CT of the abdomen and pelvis in about 3 months, and a colonoscopy sometime this spring. He will be back in [place] in 6 months. We will make further adjustments once we get the final report from his visit there, but at this point it is very gratifying that he is first of all alive and second with no evidence of disease activity. He is aware of  symptoms of worsening graft-versus-host and will call us if they develop, otherwise we will continue to monitor his basic labs on a weekly basis, and his IgG and tacrolimus levels on a monthly basis. Marland KitchenMAGRINAT,Katlin Bortner C    02/15/2013

## 2013-02-15 NOTE — Patient Instructions (Addendum)
[facility] Discharge Instructions   .   BELOW ARE SYMPTOMS THAT SHOULD BE REPORTED IMMEDIATELY:  *FEVER GREATER THAN 100.5 F  *CHILLS WITH OR WITHOUT FEVER  NAUSEA AND VOMITING THAT IS NOT CONTROLLED WITH YOUR NAUSEA MEDICATION  *UNUSUAL SHORTNESS OF BREATH  *UNUSUAL BRUISING OR BLEEDING  TENDERNESS IN MOUTH AND THROAT WITH OR WITHOUT PRESENCE OF ULCERS  *URINARY PROBLEMS  *BOWEL PROBLEMS  UNUSUAL RASH Items with * indicate a potential emergency and should be followed up as soon as possible.  One of the nurses will contact you 24 hours after your treatment. Please let the nurse know about any problems that you may have experienced. Feel free to call the clinic you have any questions or concerns. The clinic phone number is [phone].   I have been informed and understand all the instructions given to me. I know to contact the clinic, my physician, or go to the Emergency Department if any problems should occur. I do not have any questions at this time, but understand that I may call the clinic during office hours   should I have any questions or need assistance in obtaining follow up care.    __________________________________________  _____________  __________ Signature of Patient or Authorized Representative            Date                   Time    __________________________________________ Nurse's Signature

## 2013-02-17 ENCOUNTER — Other Ambulatory Visit: Payer: Self-pay | Admitting: Dermatology

## 2013-02-17 ENCOUNTER — Ambulatory Visit (HOSPITAL_BASED_OUTPATIENT_CLINIC_OR_DEPARTMENT_OTHER): Payer: BC Managed Care – PPO

## 2013-02-17 ENCOUNTER — Other Ambulatory Visit: Payer: Self-pay | Admitting: *Deleted

## 2013-02-17 MED ORDER — SODIUM CHLORIDE 0.9 % IV SOLN
Freq: Once | INTRAVENOUS | Status: AC
  Administered 2013-02-17: 13:00:00 via INTRAVENOUS
  Filled 2013-02-17: qty 500

## 2013-02-17 NOTE — Progress Notes (Signed)
1225-Per Dr. Darnelle Catalan, OK to give IVF's with Magnesium as ordered today and Saturday.  Will draw Magnesium level next Tuesday when patient returns to [facility] per Dr. Darnelle Catalan.

## 2013-02-17 NOTE — Patient Instructions (Addendum)
[facility] Discharge Instructions   BELOW ARE SYMPTOMS THAT SHOULD BE REPORTED IMMEDIATELY:  *FEVER GREATER THAN 100.5 F  *CHILLS WITH OR WITHOUT FEVER  NAUSEA AND VOMITING THAT IS NOT CONTROLLED WITH YOUR NAUSEA MEDICATION  *UNUSUAL SHORTNESS OF BREATH  *UNUSUAL BRUISING OR BLEEDING  TENDERNESS IN MOUTH AND THROAT WITH OR WITHOUT PRESENCE OF ULCERS  *URINARY PROBLEMS  *BOWEL PROBLEMS  UNUSUAL RASH Items with * indicate a potential emergency and should be followed up as soon as possible.  Feel free to call the clinic you have any questions or concerns. The clinic phone number is [phone].   I have been informed and understand all the instructions given to me. I know to contact the clinic, my physician, or go to the Emergency Department if any problems should occur. I do not have any questions at this time, but understand that I may call the clinic during office hours   should I have any questions or need assistance in obtaining follow up care.    __________________________________________  _____________  __________ Signature of Patient or Authorized Representative            Date                   Time    __________________________________________ Nurse's Signature

## 2013-02-18 ENCOUNTER — Other Ambulatory Visit: Payer: Self-pay | Admitting: Medical Oncology

## 2013-02-19 ENCOUNTER — Ambulatory Visit (HOSPITAL_BASED_OUTPATIENT_CLINIC_OR_DEPARTMENT_OTHER): Payer: BC Managed Care – PPO

## 2013-02-19 MED ORDER — SODIUM CHLORIDE 0.9 % IV SOLN
Freq: Once | INTRAVENOUS | Status: AC
  Administered 2013-02-19: 09:00:00 via INTRAVENOUS

## 2013-02-19 NOTE — Patient Instructions (Signed)
Dehydration, Adult Dehydration is when you lose more fluids from the body than you take in. Vital organs like the kidneys, brain, and heart cannot function without a proper amount of fluids and salt. Any loss of fluids from the body can cause dehydration.  CAUSES   Vomiting.  Diarrhea.  Excessive sweating.  Excessive urine output.  Fever. SYMPTOMS  Mild dehydration  Thirst.  Dry lips.  Slightly dry mouth. Moderate dehydration  Very dry mouth.  Sunken eyes.  Skin does not bounce back quickly when lightly pinched and released.  Dark urine and decreased urine production.  Decreased tear production.  Headache. Severe dehydration  Very dry mouth.  Extreme thirst.  Rapid, weak pulse (more than 100 beats per minute at rest).  Cold hands and feet.  Not able to sweat in spite of heat and temperature.  Rapid breathing.  Blue lips.  Confusion and lethargy.  Difficulty being awakened.  Minimal urine production.  No tears. DIAGNOSIS  Your caregiver will diagnose dehydration based on your symptoms and your exam. Blood and urine tests will help confirm the diagnosis. The diagnostic evaluation should also identify the cause of dehydration. TREATMENT  Treatment of mild or moderate dehydration can often be done at home by increasing the amount of fluids that you drink. It is best to drink small amounts of fluid more often. Drinking too much at one time can make vomiting worse. Refer to the home care instructions below. Severe dehydration needs to be treated at the hospital where you will probably be given intravenous (IV) fluids that contain water and electrolytes. HOME CARE INSTRUCTIONS   Ask your caregiver about specific rehydration instructions.  Drink enough fluids to keep your urine clear or pale yellow.  Drink small amounts frequently if you have nausea and vomiting.  Eat as you normally do.  Avoid:  Foods or drinks high in sugar.  Carbonated  drinks.  Juice.  Extremely hot or cold fluids.  Drinks with caffeine.  Fatty, greasy foods.  Alcohol.  Tobacco.  Overeating.  Gelatin desserts.  Wash your hands well to avoid spreading bacteria and viruses.  Only take over-the-counter or prescription medicines for pain, discomfort, or fever as directed by your caregiver.  Ask your caregiver if you should continue all prescribed and over-the-counter medicines.  Keep all follow-up appointments with your caregiver. SEEK MEDICAL CARE IF:  You have abdominal pain and it increases or stays in one area (localizes).  You have a rash, stiff neck, or severe headache.  You are irritable, sleepy, or difficult to awaken.  You are weak, dizzy, or extremely thirsty. SEEK IMMEDIATE MEDICAL CARE IF:   You are unable to keep fluids down or you get worse despite treatment.  You have frequent episodes of vomiting or diarrhea.  You have blood or green matter (bile) in your vomit.  You have blood in your stool or your stool looks black and tarry.  You have not urinated in 6 to 8 hours, or you have only urinated a small amount of very dark urine.  You have a fever.  You faint. MAKE SURE YOU:   Understand these instructions.  Will watch your condition.  Will get help right away if you are not doing well or get worse. Document Released: 12/08/2005 Document Revised: 03/01/2012 Document Reviewed: 07/28/2011 Exit[employer] Patient Information 2013 [employer].  

## 2013-02-21 ENCOUNTER — Ambulatory Visit (HOSPITAL_BASED_OUTPATIENT_CLINIC_OR_DEPARTMENT_OTHER): Payer: BC Managed Care – PPO

## 2013-02-21 ENCOUNTER — Other Ambulatory Visit: Payer: Self-pay | Admitting: *Deleted

## 2013-02-21 ENCOUNTER — Other Ambulatory Visit: Payer: Self-pay | Admitting: Lab

## 2013-02-21 ENCOUNTER — Other Ambulatory Visit (HOSPITAL_BASED_OUTPATIENT_CLINIC_OR_DEPARTMENT_OTHER): Payer: BC Managed Care – PPO | Admitting: Oncology

## 2013-02-21 VITALS — BP 143/81 | HR 72 | Temp 98.5°F | Resp 20

## 2013-02-21 DIAGNOSIS — C9210 Chronic myeloid leukemia, BCR/ABL-positive, not having achieved remission: Secondary | ICD-10-CM

## 2013-02-21 DIAGNOSIS — C9110 Chronic lymphocytic leukemia of B-cell type not having achieved remission: Secondary | ICD-10-CM

## 2013-02-21 LAB — MAGNESIUM (CC13): Magnesium: 1.9 mg/dl (ref 1.5–2.5)

## 2013-02-21 MED ORDER — HEPARIN SOD (PORK) LOCK FLUSH 100 UNIT/ML IV SOLN
500.0000 [IU] | Freq: Once | INTRAVENOUS | Status: AC
  Administered 2013-02-21: 500 [IU] via INTRAVENOUS
  Filled 2013-02-21: qty 5

## 2013-02-21 MED ORDER — SODIUM CHLORIDE 0.9 % IJ SOLN
10.0000 mL | INTRAMUSCULAR | Status: DC | PRN
  Administered 2013-02-21: 10 mL via INTRAVENOUS
  Filled 2013-02-21: qty 10

## 2013-02-21 MED ORDER — SODIUM CHLORIDE 0.9 % IV SOLN
INTRAVENOUS | Status: DC
  Administered 2013-02-21: 11:00:00 via INTRAVENOUS
  Filled 2013-02-21: qty 1000

## 2013-02-21 MED ORDER — SODIUM CHLORIDE 0.9 % IV SOLN
INTRAVENOUS | Status: DC
  Administered 2013-02-21: 11:00:00 via INTRAVENOUS

## 2013-02-23 ENCOUNTER — Other Ambulatory Visit: Payer: Self-pay | Admitting: Physician Assistant

## 2013-02-23 ENCOUNTER — Ambulatory Visit: Payer: Self-pay

## 2013-02-23 DIAGNOSIS — C9110 Chronic lymphocytic leukemia of B-cell type not having achieved remission: Secondary | ICD-10-CM

## 2013-02-24 ENCOUNTER — Ambulatory Visit (HOSPITAL_BASED_OUTPATIENT_CLINIC_OR_DEPARTMENT_OTHER): Payer: BC Managed Care – PPO

## 2013-02-24 ENCOUNTER — Other Ambulatory Visit (HOSPITAL_BASED_OUTPATIENT_CLINIC_OR_DEPARTMENT_OTHER): Payer: BC Managed Care – PPO

## 2013-02-24 ENCOUNTER — Ambulatory Visit (HOSPITAL_BASED_OUTPATIENT_CLINIC_OR_DEPARTMENT_OTHER): Payer: BC Managed Care – PPO | Admitting: Physician Assistant

## 2013-02-24 ENCOUNTER — Other Ambulatory Visit: Payer: Self-pay | Admitting: *Deleted

## 2013-02-24 ENCOUNTER — Encounter: Payer: Self-pay | Admitting: Physician Assistant

## 2013-02-24 VITALS — BP 135/86 | HR 80 | Temp 98.0°F | Resp 20 | Ht 67.0 in | Wt 168.2 lb

## 2013-02-24 DIAGNOSIS — C9110 Chronic lymphocytic leukemia of B-cell type not having achieved remission: Secondary | ICD-10-CM

## 2013-02-24 DIAGNOSIS — R21 Rash and other nonspecific skin eruption: Secondary | ICD-10-CM

## 2013-02-24 DIAGNOSIS — R112 Nausea with vomiting, unspecified: Secondary | ICD-10-CM

## 2013-02-24 DIAGNOSIS — D61818 Other pancytopenia: Secondary | ICD-10-CM

## 2013-02-24 DIAGNOSIS — I4891 Unspecified atrial fibrillation: Secondary | ICD-10-CM

## 2013-02-24 DIAGNOSIS — R894 Abnormal immunological findings in specimens from other organs, systems and tissues: Secondary | ICD-10-CM

## 2013-02-24 DIAGNOSIS — D801 Nonfamilial hypogammaglobulinemia: Secondary | ICD-10-CM

## 2013-02-24 DIAGNOSIS — Z9489 Other transplanted organ and tissue status: Secondary | ICD-10-CM

## 2013-02-24 DIAGNOSIS — R768 Other specified abnormal immunological findings in serum: Secondary | ICD-10-CM

## 2013-02-24 DIAGNOSIS — K449 Diaphragmatic hernia without obstruction or gangrene: Secondary | ICD-10-CM

## 2013-02-24 DIAGNOSIS — D89811 Chronic graft-versus-host disease: Secondary | ICD-10-CM

## 2013-02-24 DIAGNOSIS — E860 Dehydration: Secondary | ICD-10-CM

## 2013-02-24 LAB — LACTATE DEHYDROGENASE (CC13): LDH: 262 U/L — ABNORMAL HIGH (ref 125–245)

## 2013-02-24 LAB — COMPREHENSIVE METABOLIC PANEL (CC13)
Alkaline Phosphatase: 113 U/L (ref 40–150)
BUN: 49.6 mg/dL — ABNORMAL HIGH (ref 7.0–26.0)
CO2: 17 mEq/L — ABNORMAL LOW (ref 22–29)
Creatinine: 0.9 mg/dL (ref 0.7–1.3)
Glucose: 184 mg/dl — ABNORMAL HIGH (ref 70–99)
Total Bilirubin: 0.26 mg/dL (ref 0.20–1.20)
Total Protein: 5.1 g/dL — ABNORMAL LOW (ref 6.4–8.3)

## 2013-02-24 LAB — CBC WITH DIFFERENTIAL/PLATELET
Basophils Absolute: 0 10*3/uL (ref 0.0–0.1)
EOS%: 0.2 % (ref 0.0–7.0)
HCT: 33.3 % — ABNORMAL LOW (ref 38.4–49.9)
HGB: 10.7 g/dL — ABNORMAL LOW (ref 13.0–17.1)
LYMPH%: 32.9 % (ref 14.0–49.0)
MCH: 30.4 pg (ref 27.2–33.4)
MONO#: 0.6 10*3/uL (ref 0.1–0.9)
NEUT%: 56.6 % (ref 39.0–75.0)
Platelets: 180 10*3/uL (ref 140–400)
lymph#: 1.9 10*3/uL (ref 0.9–3.3)

## 2013-02-24 LAB — MAGNESIUM (CC13): Magnesium: 2.1 mg/dl (ref 1.5–2.5)

## 2013-02-24 MED ORDER — SULFAMETHOXAZOLE-TMP DS 800-160 MG PO TABS: 1.0000 | ORAL_TABLET | Freq: Every day | ORAL | Status: DC

## 2013-02-24 MED ORDER — HEPARIN SOD (PORK) LOCK FLUSH 100 UNIT/ML IV SOLN
500.0000 [IU] | Freq: Once | INTRAVENOUS | Status: AC
  Administered 2013-02-24: 500 [IU] via INTRAVENOUS
  Filled 2013-02-24: qty 5

## 2013-02-24 MED ORDER — SODIUM CHLORIDE 0.9 % IV SOLN
Freq: Once | INTRAVENOUS | Status: AC
  Administered 2013-02-24: 11:00:00 via INTRAVENOUS
  Filled 2013-02-24: qty 500

## 2013-02-24 MED ORDER — SODIUM CHLORIDE 0.9 % IJ SOLN
10.0000 mL | INTRAMUSCULAR | Status: DC | PRN
  Administered 2013-02-24: 10 mL via INTRAVENOUS
  Filled 2013-02-24: qty 10

## 2013-02-24 NOTE — Patient Instructions (Addendum)
Hypomagnesemia Magnesium is a common ion (mineral) in the body which is needed for metabolism. It is about how the body handles food and other chemical reactions necessary for life. Only about 2% of the magnesium in our body is found in the blood. When this is low, it is called hypomagnesemia. The blood will measure only a tiny amount of the magnesium in our body. When it is low in our blood, it does not mean that the whole body supply is low. The normal serum concentration ranges from 1.8-2.5 mEq/L. When the level gets to be less than 1.0 mEq/L, a number of problems begin to happen.  CAUSES   Receiving intravenous fluids without magnesium replacement.  Loss of magnesium from the bowel by naso-gastric suction.  Loss of magnesium from nausea and vomiting or severe diarrhea. Any of the inflammatory bowel conditions can cause this.  Abuse of alcohol often leads to low serum magnesium.  An inherited form of magnesium loss happens when the kidneys lose magnesium. This is called familial or primary hypomagnesemia.  Some medications such as diuretics also cause the loss of magnesium. SYMPTOMS  These following problems are worse if the changes in magnesium levels come on suddenly.  Tremor.  Confusion.  Muscle weakness.  Over-sensitive to sights and sounds.  Sensitive reflexes.  Depression.  Muscular fibrillations.  Over-reactivity of the nerves.  Irritability.  Psychosis.  Spasms of the hand muscles.  Tetany (where the muscles go into uncontrollable spasms). DIAGNOSIS  This condition can be diagnosed by blood tests. TREATMENT   In emergency, magnesium can be given intravenously (by vein).  If the condition is less worrisome, it can be corrected by diet. High levels of magnesium are found in green leafy vegetables, peas, beans and nuts among other things. It can also be given through medications by mouth.  If it is being caused by medications, changes can be made.  If  alcohol is a problem, help is available if there are difficulties giving it up. Document Released: 09/03/2005 Document Revised: 03/01/2012 Document Reviewed: 07/28/2008 Exit[employer] Patient Information 2013 [employer].  

## 2013-02-24 NOTE — Progress Notes (Signed)
ID: Timothy [name]   DOB: 09/16/1946  MR#: [id number]  WUJ#:[id number]  NFA:OZHY,Q DOUGLAS, MD SU: OTHER MD: Donzetta Starch  HISTORY OF PRESENT ILLNESS: We have very complete records from Dr. Sydnee Levans in [place], and in summary:  The patient was initially diagnosed in August 2000, with a white cell count of 23,600, but normal hemoglobin and platelets, and no significant symptomatology. Over the next several years his white cell count drifted up, and he eventually developed some symptoms of night sweats in particular, leading to treatment with fludarabine, Cytoxan and rituxan for five cycles given between December 2006 and May 2007.  We have CT scans from June 2006, November 2006 and April 2007, and comparing the November 2006 and April 2007 scans, there was near complete response. He had subsequent therapy in [place] as detailed below, but with decreased response, leading to allogeneic stem-cell transplant at the [facility] 02/24/2012.  INTERVAL HISTORY: Timothy [name] returns with his wife [name]  for followup of his CLL, status post allogeneic stem cell transplant in [place] March 2013.   Overall, Timothy [name] is feeling much better. He is feeling stronger. His appetite has improved. His diarrhea has been easier to control. Typically, he has 2 bowel movements in the morning, the first of which is normal, and the second is loose. He then has one normal bowel movement again in the evening. He continues on both Lomotil and Questran effectively. He's had no blood or mucus in the stool. He denies any abdominal pain.    REVIEW OF SYSTEMS: Timothy [name] denies any fevers, chills, or night sweats. She's noticed no skin changes. He recently had an area on his scalp biopsied which showed basal cell carcinoma with Dr. Yetta Barre. He denies any signs of abnormal bleeding. She's had no nausea or emesis. He denies any cough or phlegm production and has shortness of breath only with exertion. He's had no abnormal  headaches, dizziness, or change in vision. His energy level is fair. He continues to have some lower back pain, but has had no new or unusual myalgias, arthralgias, bony pain, or peripheral swelling.  A detailed review of systems is otherwise stable and noncontributory.   PAST MEDICAL HISTORY: Past Medical History  Diagnosis Date  . 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 Diverticular disease   . Hyperlipidemia   . Obesity   . Hypertension   . Hiatal hernia   . CMV (cytomegalovirus) antibody positive     pre-transplant, with seroconversion x2 pst-transplant  . Right bundle branch block     pre-transplant  . CKD (chronic kidney disease) stage 2, GFR 60-89 ml/min   . Pancytopenia   . Steroid-induced diabetes   . Atrial fibrillation     post-transplant  . Myopathy   . 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 Leukemia, chronic lymphoid   . 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 Diarrhea in adult patient 12/05/2012    Due to active GVHD  . CLL (chronic lymphocytic leukemia) 12/05/2012    Dx 07/1999; started Rx 12/06  AlloBMT 3/13  . Rash of face 12/05/2012    Due to GVHD  . Hypomagnesemia 01/26/2013    PAST SURGICAL HISTORY: Past Surgical History  Procedure Laterality Date  . Tonsillectomy and adenoidectomy    . Bone marrow transplant    . Flexible sigmoidoscopy  11/17/2012    Procedure: FLEXIBLE SIGMOIDOSCOPY;  Surgeon: Petra Kuba,  MD;  Location: [facility];  Service: Endoscopy;  Laterality: N/A;  Dr Ewing Schlein states will be admitted to [facility]oom 1339 11/16/12  . Esophagogastroduodenoscopy  11/17/2012    Procedure: ESOPHAGOGASTRODUODENOSCOPY (EGD);  Surgeon: Petra Kuba, MD;  Location: [facility];  Service: Endoscopy;  Laterality: N/A;    FAMILY HISTORY Family History  Problem Relation Age of Onset  . Cancer Father    The patient's father died from complications  of chronic lymphocytic leukemia at the age of 69.  It had been diagnosed seven years before when he was 25.  The patient is enrolled in a familial chronic lymphocytic leukemia study out of the [facility].  The patient's mother is 4, alive, unfortunately suffering with dementia, and he has a brother, 38, who is otherwise in fair health.   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  His wife of >40 years, [name], is a homemaker.  Their daughter, [name], lives in [place].  She also is a Futures trader.  The patient has an 56 year old grandson and an 46-year-old granddaughter, and that is really the main reason he moved to this area.  He is a International aid/development worker.     ADVANCED DIRECTIVES:  HEALTH MAINTENANCE: History  Substance Use Topics  . Smoking status: Never Smoker   . Smokeless tobacco: Never Used  . Alcohol Use: No     Colonoscopy:  PSA:  Bone density:  Lipid panel:  Allergies  Allergen Reactions  . Benadryl (Diphenhydramine Hcl)     "Restless leg syndrome"    Current Outpatient Prescriptions  Medication Sig Dispense Refill  . acyclovir (ZOVIRAX) 400 MG tablet Take 2 tablets (800 mg total) by mouth 2 (two) times daily.  60 tablet  12  . budesonide (ENTOCORT EC) 3 MG 24 hr capsule Take 3 capsules (9 mg total) by mouth every morning.  90 capsule  6  . Calcium Citrate (CITRACAL PO) Take 1,200 mg by mouth 2 (two) times daily.      . cholecalciferol (VITAMIN D) 1000 UNITS tablet Take 1,000 Units by mouth every evening.       . cholestyramine (QUESTRAN) 4 G packet Take 1 packet by mouth 2 (two) times daily with a meal.  60 each  1  . diphenoxylate-atropine (LOMOTIL) 2.5-0.025 MG per tablet Take 1 tablet by mouth 4 (four) times daily as needed for diarrhea or loose stools.  30 tablet  1  . dronabinol (MARINOL) 2.5 MG capsule Take 1 capsule (2.5 mg total) by mouth 2  (two) times daily before lunch and supper.  60 capsule  0  . fluconazole (DIFLUCAN) 100 MG tablet Take 1 tablet (100 mg total) by mouth every morning.  30 tablet  12  . HYDROcodone-acetaminophen (NORCO) 10-325 MG per tablet Take 1 tablet by mouth daily with breakfast.  30 tablet  0  . labetalol (NORMODYNE) 200 MG tablet Take 1 tablet (200 mg total) by mouth 2 (two) times daily.  60 tablet  12  . levofloxacin (LEVAQUIN) 500 MG tablet Take 500 mg by mouth daily.      . Multiple Vitamins-Minerals (CENTRUM SILVER ADULT 50+) TABS Take 1 tablet by mouth every evening.       . Nutritional Supplements (FEEDING SUPPLEMENT, VITAL 1.5 CAL,) LIQD Take 237 mLs by mouth 2 (two) times daily.  60 Can  6  . omeprazole (PRILOSEC) 40 MG capsule Take 40 mg  by mouth daily.      . predniSONE (DELTASONE) 20 MG tablet Take 1 tablet (20 mg total) by mouth daily with breakfast.  60 tablet  12  . protein supplement (RESOURCE BENEPROTEIN) POWD Take 6 g by mouth 3 (three) times daily with meals.      . sertraline (ZOLOFT) 50 MG tablet Takes 50 mg one day and 100 mg the next day  90 tablet  3  . sulfamethoxazole-trimethoprim (BACTRIM DS) 800-160 MG per tablet Take 1 tablet by mouth daily.  30 tablet  11  . tacrolimus (PROGRAF) 0.5 MG capsule Take 1.5 mg by mouth 2 (two) times daily.      Marland Kitchen testosterone (ANDRODERM) 4 MG/24HR PT24 patch Place 1 patch onto the skin daily.  30 patch  3  . antiseptic oral rinse (BIOTENE) LIQD 15 mLs by Mouth Rinse route 2 times daily at 12 noon and 4 pm.  237 mL  6  . Sodium Chloride Flush (NORMAL SALINE FLUSH) 0.9 % SOLN        No current facility-administered medications for this visit.   Facility-Administered Medications Ordered in Other Visits  Medication Dose Route Frequency Timothy [name] Last Rate Last Dose  . sodium chloride 0.9 % injection 10 mL  10 mL Intravenous PRN Lowella Dell, MD   10 mL at 08/11/12 1606  . sodium chloride 0.9 % injection 10 mL  10 mL Intravenous PRN Lowella Dell, MD   10 mL at 02/24/13 1232    OBJECTIVE: Middle-aged white male in no acute distress Filed Vitals:   02/24/13 0956  BP: 135/86  Pulse: 80  Temp: 98 F (36.7 C)  Resp: 20     Body mass index is 26.34 kg/(m^2).    Filed Weights   02/24/13 0956  Weight: 168 lb 3.2 oz (76.295 kg)   ECOG FS: 1  Sclerae unicteric  Oropharynx clear and moist, no ulcerations or lesions noted No cervical or supraclavicular adenopathy  Lungs clear to auscultation, fair excursion Heart regular rate and rhythm Abdomen  +BS , soft, nontender, with tightening of the abdominal wall, there is what appears to be an umbilical hernia just inferior to the umbilicus. This area is not painful. MSK   no focal spinal tenderness to gentle palpation No peripheral edema Neuro: nonfocal, well oriented, positive affect Skin: normal turgor, no rash;    LAB RESULTS:      Chemistry      Component Value Date/Time   NA 137 02/24/2013 0939   NA 136 12/27/2012 0440   K 5.1 Repeated and Verified 02/24/2013 0939   K 3.9 12/27/2012 0440   CL 111* 02/24/2013 0939   CL 108 12/27/2012 0440   CO2 17 Repeated and Verified* 02/24/2013 0939   CO2 22 12/27/2012 0440   BUN 49.6* 02/24/2013 0939   BUN 19 12/27/2012 0440   CREATININE 0.9 02/24/2013 0939   CREATININE 0.63 12/27/2012 0440      Component Value Date/Time   CALCIUM 8.5 02/24/2013 0939   CALCIUM 6.9* 12/27/2012 0440   ALKPHOS 113 02/24/2013 0939   ALKPHOS 91 12/27/2012 0440   AST 30 02/24/2013 0939   AST 18 12/27/2012 0440   ALT 63* 02/24/2013 0939   ALT 18 12/27/2012 0440   BILITOT 0.26 02/24/2013 0939   BILITOT 0.2* 12/27/2012 0440      STUDIES:  No results found.   ASSESSMENT: 67 y.o. Timothy [name] man with a history of well-differentiated lymphocytic lymphoma/ chronic lymphoid leukemia initially diagnosed in  2000, not requiring intervention until 2006; with multiple chromosomal abnormalities.  His treatment history is as follows:  (1) fludarabine/cyclophosphamide/rituximab x5  completed May 2007.   (2) rituximab for 8 doses October 2010, with partial response   (3) Leustatin and ofatumumab weekly x8 July to September 2011 followed by maintenance ofatumumab maintenance ofatumumab every 2 months, with initial response but rising counts September 2012   (4) status-post unrelated donor stem-cell transplant 02/24/2012 at the [facility]  (a) conditioning regimen consisted of fludarabine + TBI at 200 cGy, followed by rituximab x27;  (b) CMV reactivation x3 (patient CMV positive, donor negative), s/p ganciclovir treatment; 3d reactivation August 2013, s/p gancyclovir, with negative PCR mid-September 2013; last gancyclovir dose 10/06/2012 (c) Chronic GVHD: involving gut and skin, treated with steroids, tacrolimus and MMF.  MMF was eventually d/c'd and tacrolimus currently at a dose of 1.5mg  BID (d) atrial fibrillation: resolved on brief amiodarone regimen (e) steroid-induced myopathy: improving  (f) hypomagnesemia: improved after d/c gancyclovir (g) hypogammaglobulinemia: s/p IVIG most recently 01/07/2013. (h) history of elevated triglycerides (606 on 07/14/2012)  (i) adrenal insufficiency: on prednisone and budesonide (j) pancytopenia, improved  (5) restaging studies September 2013  including CT scans, flow cytometry, and bone marrow biopsy, showed no evidence of residual chronic lymphoid leukemia.  (6) recurrent GVHD (skin rash, mouth changes, severe diarrhea and gastric/duodenal/colonic biopsies 11/17/2012 c/w GVHD grade 2) now only remaining sign is bothersome diarrhea; c diff negative x3; continuing current regimen   (7) severe malnutrition -- on VITAL supplement in addition to regular diet; on Marinol for anorexia  (8) testosterone deficiency--on patch   (9) deconditioning: ongoing REHAB   (10) mild dehydration: encouraged increased po fluids; receives IVF support w magnesium every Mon, Th and Sat  (11) steroid-induced osteoporosis with compression fractures:  received pamidronate 12/18/2012   (12) nausea: improved on current meds  PLAN: Overall, Jazion is doing quite well and will continue on his current regimen. We will continue to give him IV fluids along with IV magnesium 3 times weekly, and I will see him again next week on March 10 for repeat labs and followup exam. We will monitor his basic labs on a weekly basis, tacrolimus every 3 weeks, and his IgG on a monthly basis.   I am also refilling Omarr's  Bactrim which he is taking one time daily.   He will need a repeat CT of the abdomen and pelvis in about 3 months, and a colonoscopy sometime this spring. He will be back in [place] in 6 months. We will make further adjustments once we get the final report from his visit there. In the meanwhile, he noticed called any changes or problems.    Zollie Scale    02/24/2013

## 2013-02-25 ENCOUNTER — Other Ambulatory Visit: Payer: Self-pay | Admitting: Physician Assistant

## 2013-02-25 ENCOUNTER — Encounter: Payer: Self-pay | Admitting: Physician Assistant

## 2013-02-25 DIAGNOSIS — D809 Immunodeficiency with predominantly antibody defects, unspecified: Secondary | ICD-10-CM

## 2013-02-25 NOTE — Progress Notes (Signed)
Timothy [name]'s labs on 02/24/13 showed a low IgG of 124.  He is already scheduled for labs, follow up visit and IV fluids with magnesium on Monday 02/28/13.  There were no available openings for IVIG on that day.  He has been  scheduled for IVIG (Octagam) on Tuesday, 03/01/13, at 8:00.  I called the patient and informed his wife Timothy [name] of this appointment.  This IVIG order/dosing was reviewed with our pharmacy as well.  Zollie Scale, PA-C 02/25/2013

## 2013-02-26 ENCOUNTER — Ambulatory Visit (HOSPITAL_BASED_OUTPATIENT_CLINIC_OR_DEPARTMENT_OTHER): Payer: BC Managed Care – PPO

## 2013-02-26 MED ORDER — SODIUM CHLORIDE 0.9 % IV SOLN
Freq: Once | INTRAVENOUS | Status: AC
  Administered 2013-02-26: 11:00:00 via INTRAVENOUS
  Filled 2013-02-26: qty 500

## 2013-02-27 LAB — IGG, IGA, IGM: IgG (Immunoglobin G), Serum: 124 mg/dL — ABNORMAL LOW (ref 650–1600)

## 2013-02-28 ENCOUNTER — Ambulatory Visit (HOSPITAL_BASED_OUTPATIENT_CLINIC_OR_DEPARTMENT_OTHER): Payer: BC Managed Care – PPO | Admitting: Physician Assistant

## 2013-02-28 ENCOUNTER — Encounter: Payer: Self-pay | Admitting: Physician Assistant

## 2013-02-28 ENCOUNTER — Other Ambulatory Visit (HOSPITAL_BASED_OUTPATIENT_CLINIC_OR_DEPARTMENT_OTHER): Payer: BC Managed Care – PPO

## 2013-02-28 ENCOUNTER — Ambulatory Visit (HOSPITAL_BASED_OUTPATIENT_CLINIC_OR_DEPARTMENT_OTHER): Payer: BC Managed Care – PPO

## 2013-02-28 VITALS — BP 160/90 | HR 70 | Temp 98.2°F | Resp 20 | Ht 67.0 in | Wt 165.9 lb

## 2013-02-28 DIAGNOSIS — D89811 Chronic graft-versus-host disease: Secondary | ICD-10-CM

## 2013-02-28 DIAGNOSIS — R768 Other specified abnormal immunological findings in serum: Secondary | ICD-10-CM

## 2013-02-28 DIAGNOSIS — D801 Nonfamilial hypogammaglobulinemia: Secondary | ICD-10-CM

## 2013-02-28 DIAGNOSIS — D61818 Other pancytopenia: Secondary | ICD-10-CM

## 2013-02-28 DIAGNOSIS — K449 Diaphragmatic hernia without obstruction or gangrene: Secondary | ICD-10-CM

## 2013-02-28 DIAGNOSIS — C9110 Chronic lymphocytic leukemia of B-cell type not having achieved remission: Secondary | ICD-10-CM

## 2013-02-28 DIAGNOSIS — E860 Dehydration: Secondary | ICD-10-CM

## 2013-02-28 DIAGNOSIS — R197 Diarrhea, unspecified: Secondary | ICD-10-CM

## 2013-02-28 DIAGNOSIS — D809 Immunodeficiency with predominantly antibody defects, unspecified: Secondary | ICD-10-CM

## 2013-02-28 DIAGNOSIS — I10 Essential (primary) hypertension: Secondary | ICD-10-CM

## 2013-02-28 DIAGNOSIS — R112 Nausea with vomiting, unspecified: Secondary | ICD-10-CM

## 2013-02-28 DIAGNOSIS — I4891 Unspecified atrial fibrillation: Secondary | ICD-10-CM

## 2013-02-28 DIAGNOSIS — R21 Rash and other nonspecific skin eruption: Secondary | ICD-10-CM

## 2013-02-28 DIAGNOSIS — Z9489 Other transplanted organ and tissue status: Secondary | ICD-10-CM

## 2013-02-28 LAB — CBC WITH DIFFERENTIAL/PLATELET
BASO%: 0.1 % (ref 0.0–2.0)
EOS%: 0 % (ref 0.0–7.0)
MCH: 30.6 pg (ref 27.2–33.4)
MCHC: 32.7 g/dL (ref 32.0–36.0)
MONO#: 0.5 10*3/uL (ref 0.1–0.9)
NEUT%: 77.3 % — ABNORMAL HIGH (ref 39.0–75.0)
RBC: 3.66 10*6/uL — ABNORMAL LOW (ref 4.20–5.82)
RDW: 14.4 % (ref 11.0–14.6)
WBC: 8.2 10*3/uL (ref 4.0–10.3)
lymph#: 1.4 10*3/uL (ref 0.9–3.3)
nRBC: 0 % (ref 0–0)

## 2013-02-28 LAB — COMPREHENSIVE METABOLIC PANEL (CC13)
ALT: 48 U/L (ref 0–55)
AST: 23 U/L (ref 5–34)
Albumin: 3 g/dL — ABNORMAL LOW (ref 3.5–5.0)
Alkaline Phosphatase: 119 U/L (ref 40–150)
BUN: 53.6 mg/dL — ABNORMAL HIGH (ref 7.0–26.0)
Chloride: 110 mEq/L — ABNORMAL HIGH (ref 98–107)
Potassium: 5.5 mEq/L — ABNORMAL HIGH (ref 3.5–5.1)

## 2013-02-28 MED ORDER — DIPHENOXYLATE-ATROPINE 2.5-0.025 MG PO TABS: 1.0000 | ORAL_TABLET | Freq: Four times a day (QID) | ORAL | Status: DC | PRN

## 2013-02-28 MED ORDER — DRONABINOL 2.5 MG PO CAPS: 2.5000 mg | ORAL_CAPSULE | Freq: Two times a day (BID) | ORAL | Status: DC

## 2013-02-28 MED ORDER — SODIUM CHLORIDE 0.9 % IV SOLN
Freq: Once | INTRAVENOUS | Status: AC
  Administered 2013-02-28: 16:00:00 via INTRAVENOUS
  Filled 2013-02-28: qty 500

## 2013-02-28 NOTE — Progress Notes (Signed)
ID: [name]   DOB: Mar 29, 1946  MR#: [id number]  WUJ#:[id number]  NFA:OZHY,Q DOUGLAS, MD SU: OTHER MD: Donzetta Starch  HISTORY OF PRESENT ILLNESS: We have very complete records from Dr. Sydnee Levans in [place], and in summary:  The patient was initially diagnosed in August 2000, with a white cell count of 23,600, but normal hemoglobin and platelets, and no significant symptomatology. Over the next several years his white cell count drifted up, and he eventually developed some symptoms of night sweats in particular, leading to treatment with fludarabine, Cytoxan and rituxan for five cycles given between December 2006 and May 2007.  We have CT scans from June 2006, November 2006 and April 2007, and comparing the November 2006 and April 2007 scans, there was near complete response. He had subsequent therapy in [place] as detailed below, but with decreased response, leading to allogeneic stem-cell transplant at the [facility] 02/24/2012.  INTERVAL HISTORY: Yunior returns with his wife [name]  for followup of his CLL, status post allogeneic stem cell transplant in [place] March 2013.   Rylyn is feeling a little more tired this week. His energy level is definitely decreased. His appetite is fair, and his weight has remained stable. He continues to have diarrhea, typically averaging 3-4 bowel movements daily, with the one that is particularly loose. He is noted no blood or mucus in the stool. He has no abdominal pain. He's had no fevers or chills.  He continues on both Lomotil and Questran effectively.   REVIEW OF SYSTEMS: Daleon denies any night sweats. He's noticed no skin changes. He recently had an area on his scalp biopsied which showed basal cell carcinoma with Dr. Yetta Barre, and he is scheduled for reexcision of the area on March 25. He denies any signs of abnormal bleeding. He's had no nausea or emesis. He denies any cough or phlegm production and has shortness of breath only with  exertion. He's had no abnormal headaches, dizziness, or change in vision. He continues to have some lower back pain, but has had no new or unusual myalgias, arthralgias, bony pain, or significant peripheral swelling.  He has noticed that his blood pressure has been higher than usual. He is following this at home. It was also elevated today at 160/90 in the office. He was previously on lisinopril which he took affectively.  A detailed review of systems is otherwise stable and noncontributory.   PAST MEDICAL HISTORY: Past Medical History  Diagnosis Date  . 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 Diverticular disease   . Hyperlipidemia   . Obesity   . Hypertension   . Hiatal hernia   . CMV (cytomegalovirus) antibody positive     pre-transplant, with seroconversion x2 pst-transplant  . Right bundle branch block     pre-transplant  . CKD (chronic kidney disease) stage 2, GFR 60-89 ml/min   . Pancytopenia   . Steroid-induced diabetes   . Atrial fibrillation     post-transplant  . Myopathy   . 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 Leukemia, chronic lymphoid   . 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 Diarrhea in adult patient 12/05/2012    Due to active GVHD  . CLL (chronic lymphocytic leukemia) 12/05/2012    Dx 07/1999; started Rx 12/06  AlloBMT 3/13  . Rash of face 12/05/2012    Due to GVHD  . Hypomagnesemia 01/26/2013  PAST SURGICAL HISTORY: Past Surgical History  Procedure Laterality Date  . Tonsillectomy and adenoidectomy    . Bone marrow transplant    . Flexible sigmoidoscopy  11/17/2012    Procedure: FLEXIBLE SIGMOIDOSCOPY;  Surgeon: Petra Kuba, MD;  Location: [facility];  Service: Endoscopy;  Laterality: N/A;  Dr Ewing Schlein states will be admitted to rooom 1339 11/16/12  . Esophagogastroduodenoscopy  11/17/2012    Procedure: ESOPHAGOGASTRODUODENOSCOPY (EGD);   Surgeon: Petra Kuba, MD;  Location: [facility];  Service: Endoscopy;  Laterality: N/A;    FAMILY HISTORY Family History  Problem Relation Age of Onset  . Cancer Father    The patient's father died from complications of chronic lymphocytic leukemia at the age of 63.  It had been diagnosed seven years before when he was 97.  The patient is enrolled in a familial chronic lymphocytic leukemia study out of the [facility].  The patient's mother is 53, alive, unfortunately suffering with dementia, and he has a brother, 67, who is otherwise in fair health.   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  His wife of >40 years, [name], is a homemaker.  Their daughter, [name], lives in [place].  She also is a Futures trader.  The patient has an 26 year old grandson and an 36-year-old granddaughter, and that is really the main reason he moved to this area.  He is a International aid/development worker.     ADVANCED DIRECTIVES:  HEALTH MAINTENANCE: History  Substance Use Topics  . Smoking status: Never Smoker   . Smokeless tobacco: Never Used  . Alcohol Use: No     Colonoscopy:  PSA:  Bone density:  Lipid panel:  Allergies  Allergen Reactions  . Benadryl (Diphenhydramine Hcl)     "Restless leg syndrome"    Current Outpatient Prescriptions  Medication Sig Dispense Refill  . acyclovir (ZOVIRAX) 400 MG tablet Take 2 tablets (800 mg total) by mouth 2 (two) times daily.  60 tablet  12  . antiseptic oral rinse (BIOTENE) LIQD 15 mLs by Mouth Rinse route 2 times daily at 12 noon and 4 pm.  237 mL  6  . budesonide (ENTOCORT EC) 3 MG 24 hr capsule Take 3 capsules (9 mg total) by mouth every morning.  90 capsule  6  . Calcium Citrate (CITRACAL PO) Take 1,200 mg by mouth 2 (two) times daily.      . cholecalciferol (VITAMIN D) 1000 UNITS tablet Take 1,000 Units by mouth every evening.       .  cholestyramine (QUESTRAN) 4 G packet Take 1 packet by mouth 2 (two) times daily with a meal.  60 each  1  . diphenoxylate-atropine (LOMOTIL) 2.5-0.025 MG per tablet Take 1 tablet by mouth 4 (four) times daily as needed for diarrhea or loose stools.  60 tablet  1  . dronabinol (MARINOL) 2.5 MG capsule Take 1 capsule (2.5 mg total) by mouth 2 (two) times daily before lunch and supper.  60 capsule  0  . fluconazole (DIFLUCAN) 100 MG tablet Take 1 tablet (100 mg total) by mouth every morning.  30 tablet  12  . HYDROcodone-acetaminophen (NORCO) 10-325 MG per tablet Take 1 tablet by mouth daily with breakfast.  30 tablet  0  . labetalol (NORMODYNE) 200 MG tablet Take 1 tablet (200 mg total) by mouth 2 (two) times daily.  60 tablet  12  . Lidocaine-Hydrocortisone Ace  3-0.5 % KIT       . lisinopril (PRINIVIL,ZESTRIL) 10 MG tablet Take 10 mg by mouth daily.      . Multiple Vitamins-Minerals (CENTRUM SILVER ADULT 50+) TABS Take 1 tablet by mouth every evening.       . Nutritional Supplements (FEEDING SUPPLEMENT, VITAL 1.5 CAL,) LIQD Take 237 mLs by mouth 2 (two) times daily.  60 Can  6  . omeprazole (PRILOSEC) 40 MG capsule Take 40 mg by mouth daily.      . predniSONE (DELTASONE) 20 MG tablet Take 1 tablet (20 mg total) by mouth daily with breakfast.  60 tablet  12  . protein supplement (RESOURCE BENEPROTEIN) POWD Take 6 g by mouth 3 (three) times daily with meals.      . sertraline (ZOLOFT) 50 MG tablet Takes 50 mg one day and 100 mg the next day  90 tablet  3  . Sodium Chloride Flush (NORMAL SALINE FLUSH) 0.9 % SOLN       . sulfamethoxazole-trimethoprim (BACTRIM DS) 800-160 MG per tablet Take 1 tablet by mouth daily.  30 tablet  11  . tacrolimus (PROGRAF) 0.5 MG capsule Take 1.5 mg by mouth 2 (two) times daily.      Marland Kitchen testosterone (ANDRODERM) 4 MG/24HR PT24 patch Place 1 patch onto the skin daily.  30 patch  3   No current facility-administered medications for this visit.   Facility-Administered  Medications Ordered in Other Visits  Medication Dose Route Frequency Provider Last Rate Last Dose  . sodium chloride 0.9 % injection 10 mL  10 mL Intravenous PRN Lowella Dell, MD   10 mL at 08/11/12 1606    OBJECTIVE: Middle-aged white male in no acute distress Filed Vitals:   02/28/13 1402  BP: 160/90  Pulse: 70  Temp: 98.2 F (36.8 C)  Resp: 20     Body mass index is 25.98 kg/(m^2).    Filed Weights   02/28/13 1402  Weight: 165 lb 14.4 oz (75.252 kg)   ECOG FS: 1  Sclerae unicteric  Oropharynx clear and moist, no ulcerations or lesions noted No cervical or supraclavicular adenopathy  Lungs clear to auscultation, fair excursion Heart regular rate and rhythm Abdomen  +BS , soft, nontender MSK   no focal spinal tenderness to gentle palpation Nonpitting pedal edema bilaterally, equal bilaterally, with no upper extremity edema noted Neuro: nonfocal, well oriented, positive affect Skin: normal turgor, no rash;    LAB RESULTS:      Chemistry      Component Value Date/Time   NA 137 02/24/2013 0939   NA 136 12/27/2012 0440   K 5.1 Repeated and Verified 02/24/2013 0939   K 3.9 12/27/2012 0440   CL 111* 02/24/2013 0939   CL 108 12/27/2012 0440   CO2 17 Repeated and Verified* 02/24/2013 0939   CO2 22 12/27/2012 0440   BUN 49.6* 02/24/2013 0939   BUN 19 12/27/2012 0440   CREATININE 0.9 02/24/2013 0939   CREATININE 0.63 12/27/2012 0440      Component Value Date/Time   CALCIUM 8.5 02/24/2013 0939   CALCIUM 6.9* 12/27/2012 0440   ALKPHOS 113 02/24/2013 0939   ALKPHOS 91 12/27/2012 0440   AST 30 02/24/2013 0939   AST 18 12/27/2012 0440   ALT 63* 02/24/2013 0939   ALT 18 12/27/2012 0440   BILITOT 0.26 02/24/2013 0939   BILITOT 0.2* 12/27/2012 0440      STUDIES:  No results found.   ASSESSMENT: 67 y.o. [place] man with  a history of well-differentiated lymphocytic lymphoma/ chronic lymphoid leukemia initially diagnosed in 2000, not requiring intervention until 2006; with multiple chromosomal  abnormalities.  His treatment history is as follows:  (1) fludarabine/cyclophosphamide/rituximab x5 completed May 2007.   (2) rituximab for 8 doses October 2010, with partial response   (3) Leustatin and ofatumumab weekly x8 July to September 2011 followed by maintenance ofatumumab maintenance ofatumumab every 2 months, with initial response but rising counts September 2012   (4) status-post unrelated donor stem-cell transplant [facility]/24/2012 at [facility]  (a) conditioning regimen consisted of fludarabine + TBI at 200 cGy, followed by rituximab x27;  (b) CMV reactivation x3 (patient CMV positive, donor negative), s/p ganciclovir treatment; 3d reactivation August 2013, s/p gancyclovir, with negative PCR mid-September 2013; last gancyclovir dose 10/06/2012 (c) Chronic GVHD: involving gut and skin, treated with steroids, tacrolimus and MMF.  MMF was eventually d/c'd and tacrolimus currently at a dose of 1.5mg  BID (d) atrial fibrillation: resolved on brief amiodarone regimen (e) steroid-induced myopathy: improving  (f) hypomagnesemia: improved after d/c gancyclovir (g) hypogammaglobulinemia: s/p IVIG most recently 01/07/2013. (h) history of elevated triglycerides (606 on 07/14/2012)  (i) adrenal insufficiency: on prednisone and budesonide (j) pancytopenia, improved  (5) restaging studies September 2013  including CT scans, flow cytometry, and bone marrow biopsy, showed no evidence of residual chronic lymphoid leukemia.  (6) recurrent GVHD (skin rash, mouth changes, severe diarrhea and gastric/duodenal/colonic biopsies 11/17/2012 c/w GVHD grade 2) now only remaining sign is bothersome diarrhea; c diff negative x3; continuing current regimen   (7) severe malnutrition -- on VITAL supplement in addition to regular diet; on Marinol for anorexia  (8) testosterone deficiency--on patch   (9) deconditioning: ongoing REHAB   (10) mild dehydration: encouraged increased po fluids; receives IVF support  w magnesium every Mon, Th and Sat  (11) steroid-induced osteoporosis with compression fractures: received pamidronate 12/18/2012   (12) nausea: improved on current meds  PLAN: Overall, Abbie is doing quite well and will continue on his current regimen. We will continue to give him IV fluids along with IV magnesium 3 times weekly, and I will see him again next week on March 19 for repeat labs and followup exam. We will follow his magnesium level closely. When drawn most recently it was up to 2.1, and we may be able to drop back to twice weekly.  He is also scheduled for IVIG tomorrow, March 11. We will monitor his basic labs on a weekly basis, tacrolimus every 3 weeks, and his IgG on a monthly basis.   I am also refilling Keontae's Lomotil and Marinol. He's starting back on his lisinopril and we'll continue to follow his blood pressure closely.  He will need a repeat CT of the abdomen and pelvis in about 3 months, and a colonoscopy sometime this spring. He will be back in [place] in 6 months. We will make further adjustments once we get the final report from his visit there. In the meanwhile, he knows to call with any changes or problems.   Zollie Scale    02/28/2013

## 2013-03-01 ENCOUNTER — Ambulatory Visit (HOSPITAL_BASED_OUTPATIENT_CLINIC_OR_DEPARTMENT_OTHER): Payer: BC Managed Care – PPO

## 2013-03-01 VITALS — BP 158/88 | HR 74 | Temp 98.4°F | Resp 16

## 2013-03-01 DIAGNOSIS — D809 Immunodeficiency with predominantly antibody defects, unspecified: Secondary | ICD-10-CM

## 2013-03-01 DIAGNOSIS — D801 Nonfamilial hypogammaglobulinemia: Secondary | ICD-10-CM

## 2013-03-01 DIAGNOSIS — C9110 Chronic lymphocytic leukemia of B-cell type not having achieved remission: Secondary | ICD-10-CM

## 2013-03-01 MED ORDER — IMMUNE GLOBULIN (HUMAN) 10 GM/100ML IV SOLN
400.0000 mg/kg | Freq: Once | INTRAVENOUS | Status: AC
  Administered 2013-03-01: 30 g via INTRAVENOUS
  Filled 2013-03-01: qty 300

## 2013-03-01 MED ORDER — IMMUNE GLOBULIN (HUMAN) 5 GM/100ML IV SOLN: 0.4000 g/kg | Freq: Once | INTRAVENOUS | Status: DC

## 2013-03-01 NOTE — Patient Instructions (Signed)

## 2013-03-02 ENCOUNTER — Other Ambulatory Visit: Payer: Self-pay | Admitting: Oncology

## 2013-03-03 ENCOUNTER — Other Ambulatory Visit: Payer: Self-pay | Admitting: Oncology

## 2013-03-03 ENCOUNTER — Ambulatory Visit (HOSPITAL_BASED_OUTPATIENT_CLINIC_OR_DEPARTMENT_OTHER): Payer: BC Managed Care – PPO

## 2013-03-03 ENCOUNTER — Other Ambulatory Visit: Payer: Self-pay | Admitting: *Deleted

## 2013-03-03 DIAGNOSIS — C9110 Chronic lymphocytic leukemia of B-cell type not having achieved remission: Secondary | ICD-10-CM

## 2013-03-03 DIAGNOSIS — E099 Drug or chemical induced diabetes mellitus without complications: Secondary | ICD-10-CM

## 2013-03-03 MED ORDER — SODIUM CHLORIDE 0.9 % IV SOLN
Freq: Once | INTRAVENOUS | Status: AC
  Administered 2013-03-03: 11:00:00 via INTRAVENOUS
  Filled 2013-03-03: qty 500

## 2013-03-03 NOTE — Patient Instructions (Addendum)
Magnesium This is a blood test which measures the amount of magnesium in your blood. Most of the magnesium in your body exists in your cells. However, the magnesium in your blood is important for many processes. It is important for your nerves to be able to conduct electrical energy. This is important in heart patients with higher heartbeats. When your heart beats and then gets ready to beat again, it repolarizes. If the magnesium levels are low or high, it can affect the accuracy of the repolarization process. The magnesium levels are also monitored during pregnancy. This is done to determine if the expectant mother may have preeclampsia or toxemia. Magnesium is often used for treatment of these problems. PREPARATION FOR TEST No preparation or fasting is needed. A blood sample may be taken by inserting a needle into a vein in the arm.  NORMAL FINDINGS  Adult: 1.3 to 2.1 mEq/L or 0.65 to 1.05 mmol/L (SI units)  Child: 1.4 to 1.7 mEq/L  Newborn: 1.4 to 2 mEq/L Possible critical values: less than 0.5 mEq/L or greater than 3 mEq/L Ranges for normal findings may vary among different laboratories and hospitals. You should always check with your caregiver after having lab work or other tests done to discuss the meaning of your test results and whether your values are considered within normal limits. MEANING OF TEST  Your caregiver will go over the test results with you. Your caregiver will discuss the importance and meaning of your results. He or she will also discuss treatment options and additional tests, if needed. OBTAINING THE TEST RESULTS It is your responsibility to obtain your test results. Ask the lab or department performing the test when and how you will get your results. Document Released: 01/10/2005 Document Revised: 03/01/2012 Document Reviewed: 11/18/2008 [facility] Patient Information 2013 [place], [place].

## 2013-03-03 NOTE — Progress Notes (Signed)
Per MD review of pt's lab done today - recommended referral for diabetes probably induced by steroids secondary to post transplant medication.  This RN placed a referral per EPIC to oupt nutrition and diabetes clinic as well as electronic POF for scheduling purposes.  This RN called to above clinic at [phone], obtained VM- message left per referral and need for appointment ASAP with this RN's name and phone number for contact.  This note will be given to MD -

## 2013-03-04 ENCOUNTER — Other Ambulatory Visit: Payer: Self-pay | Admitting: *Deleted

## 2013-03-04 DIAGNOSIS — C9110 Chronic lymphocytic leukemia of B-cell type not having achieved remission: Secondary | ICD-10-CM

## 2013-03-04 DIAGNOSIS — E099 Drug or chemical induced diabetes mellitus without complications: Secondary | ICD-10-CM

## 2013-03-04 DIAGNOSIS — R197 Diarrhea, unspecified: Secondary | ICD-10-CM

## 2013-03-04 NOTE — Progress Notes (Signed)
This RN obtained an appointment for endocrinology support with Dr Romero Belling at [facility] for 03/08/2013 at 345 pt to be there at 330.  Pt called to this RN to state onset of incresed watery stools- Per inquiry Mahmoud states he had 3 watery loose stools this am.  He is using [place] and lomotil and able to hydrate well at present. Casmir states he feels change in bowels is related to decrease in prednisone ( from 50 to 45mg  ).  Per review with MD- recommendation given as  1. Stool for C-Diff 2. Continue use of anti diarrheals. 3. Resume prednisone to 50mg .  This RN also discussed with pt ( and later his wife ) appt per above with endocrinologist.  C-Diff kit with instructions given to wife. Treatment room nurse scheduled tomorrow per pt coming in for IVF notified.

## 2013-03-05 ENCOUNTER — Ambulatory Visit (HOSPITAL_BASED_OUTPATIENT_CLINIC_OR_DEPARTMENT_OTHER): Payer: BC Managed Care – PPO

## 2013-03-05 MED ORDER — HEPARIN SOD (PORK) LOCK FLUSH 100 UNIT/ML IV SOLN
500.0000 [IU] | Freq: Once | INTRAVENOUS | Status: AC
  Administered 2013-03-05: 250 [IU] via INTRAVENOUS
  Filled 2013-03-05: qty 5

## 2013-03-05 MED ORDER — SODIUM CHLORIDE 0.9 % IV SOLN
Freq: Once | INTRAVENOUS | Status: AC
  Administered 2013-03-05: 10:00:00 via INTRAVENOUS

## 2013-03-05 MED ORDER — SODIUM CHLORIDE 0.9 % IJ SOLN
10.0000 mL | INTRAMUSCULAR | Status: DC | PRN
  Administered 2013-03-05: 10 mL via INTRAVENOUS
  Filled 2013-03-05: qty 10

## 2013-03-05 NOTE — Patient Instructions (Signed)
Hypomagnesemia Magnesium is a common ion (mineral) in the body which is needed for metabolism. It is about how the body handles food and other chemical reactions necessary for life. Only about 2% of the magnesium in our body is found in the blood. When this is low, it is called hypomagnesemia. The blood will measure only a tiny amount of the magnesium in our body. When it is low in our blood, it does not mean that the whole body supply is low. The normal serum concentration ranges from 1.8-2.5 mEq/L. When the level gets to be less than 1.0 mEq/L, a number of problems begin to happen.  CAUSES   Receiving intravenous fluids without magnesium replacement.  Loss of magnesium from the bowel by naso-gastric suction.  Loss of magnesium from nausea and vomiting or severe diarrhea. Any of the inflammatory bowel conditions can cause this.  Abuse of alcohol often leads to low serum magnesium.  An inherited form of magnesium loss happens when the kidneys lose magnesium. This is called familial or primary hypomagnesemia.  Some medications such as diuretics also cause the loss of magnesium. SYMPTOMS  These following problems are worse if the changes in magnesium levels come on suddenly.  Tremor.  Confusion.  Muscle weakness.  Over-sensitive to sights and sounds.  Sensitive reflexes.  Depression.  Muscular fibrillations.  Over-reactivity of the nerves.  Irritability.  Psychosis.  Spasms of the hand muscles.  Tetany (where the muscles go into uncontrollable spasms). DIAGNOSIS  This condition can be diagnosed by blood tests. TREATMENT   In emergency, magnesium can be given intravenously (by vein).  If the condition is less worrisome, it can be corrected by diet. High levels of magnesium are found in green leafy vegetables, peas, beans and nuts among other things. It can also be given through medications by mouth.  If it is being caused by medications, changes can be made.  If  alcohol is a problem, help is available if there are difficulties giving it up. Document Released: 09/03/2005 Document Revised: 03/01/2012 Document Reviewed: 07/28/2008 [facility] Patient Information 2013 [place], [place].

## 2013-03-07 ENCOUNTER — Telehealth: Payer: Self-pay | Admitting: Oncology

## 2013-03-07 ENCOUNTER — Ambulatory Visit (HOSPITAL_BASED_OUTPATIENT_CLINIC_OR_DEPARTMENT_OTHER): Payer: BC Managed Care – PPO

## 2013-03-07 ENCOUNTER — Other Ambulatory Visit: Payer: Self-pay | Admitting: Physician Assistant

## 2013-03-07 ENCOUNTER — Other Ambulatory Visit: Payer: Self-pay | Admitting: *Deleted

## 2013-03-07 DIAGNOSIS — R634 Abnormal weight loss: Secondary | ICD-10-CM

## 2013-03-07 DIAGNOSIS — D89811 Chronic graft-versus-host disease: Secondary | ICD-10-CM

## 2013-03-07 DIAGNOSIS — Z9489 Other transplanted organ and tissue status: Secondary | ICD-10-CM

## 2013-03-07 DIAGNOSIS — C9110 Chronic lymphocytic leukemia of B-cell type not having achieved remission: Secondary | ICD-10-CM

## 2013-03-07 DIAGNOSIS — E099 Drug or chemical induced diabetes mellitus without complications: Secondary | ICD-10-CM

## 2013-03-07 MED ORDER — HEPARIN SOD (PORK) LOCK FLUSH 100 UNIT/ML IV SOLN
500.0000 [IU] | Freq: Once | INTRAVENOUS | Status: DC
  Filled 2013-03-07: qty 5

## 2013-03-07 MED ORDER — BECLOMETHASONE DIPROPIONATE POWD: Status: DC

## 2013-03-07 MED ORDER — SODIUM CHLORIDE 0.9 % IJ SOLN
10.0000 mL | INTRAMUSCULAR | Status: DC | PRN
  Administered 2013-03-07: 10 mL via INTRAVENOUS
  Filled 2013-03-07: qty 10

## 2013-03-07 MED ORDER — SODIUM CHLORIDE 0.9 % IV SOLN
Freq: Once | INTRAVENOUS | Status: AC
  Administered 2013-03-07: 12:00:00 via INTRAVENOUS
  Filled 2013-03-07: qty 500

## 2013-03-07 MED ORDER — HEPARIN SOD (PORK) LOCK FLUSH 100 UNIT/ML IV SOLN
250.0000 [IU] | Freq: Once | INTRAVENOUS | Status: AC
  Administered 2013-03-07: 14:00:00 via INTRAVENOUS
  Filled 2013-03-07: qty 5

## 2013-03-07 MED ORDER — HEPARIN SOD (PORK) LOCK FLUSH 100 UNIT/ML IV SOLN
250.0000 [IU] | Freq: Once | INTRAVENOUS | Status: AC
  Administered 2013-03-07: 250 [IU] via INTRAVENOUS
  Filled 2013-03-07: qty 5

## 2013-03-07 NOTE — Progress Notes (Signed)
I spoke with the patient briefly today while he was here getting IV fluids. He was recently diagnosed with steroid-induced diabetes, and notes that he is lost an additional 6 pounds over the past week. He tells me he is "confused about what he should be and what he shouldn't" secondary to diabetes, so often he is "just not eating anything".  We discussed coordinating an appointment with one of our nutritionists with one of his many IV fluid appointments, and Jaque is very much in favor of this plan. I have put the order in, along with a POF to schedule the appointment.  He is also scheduled to meet with Dr. Everardo All tomorrow to discuss his diabetes, and I will be seeing Aurther Loft later this week for additional followup and assessment.  Zollie Scale, PA-C 03/17/

## 2013-03-07 NOTE — Telephone Encounter (Signed)
Added appt w/nutritionist to 3/19 tx appt while pt in inf. S/w AB and per AB pt can see BN here appt does not have to be scheduled @ nutrition and diabetes clinic (per 3/13 pof).

## 2013-03-07 NOTE — Patient Instructions (Addendum)
Magnesium This is a blood test which measures the amount of magnesium in your blood. Most of the magnesium in your body exists in your cells. However, the magnesium in your blood is important for many processes. It is important for your nerves to be able to conduct electrical energy. This is important in heart patients with higher heartbeats. When your heart beats and then gets ready to beat again, it repolarizes. If the magnesium levels are low or high, it can affect the accuracy of the repolarization process. The magnesium levels are also monitored during pregnancy. This is done to determine if the expectant mother may have preeclampsia or toxemia. Magnesium is often used for treatment of these problems. PREPARATION FOR TEST No preparation or fasting is needed. A blood sample may be taken by inserting a needle into a vein in the arm.  NORMAL FINDINGS  Adult: 1.3 to 2.1 mEq/L or 0.65 to 1.05 mmol/L (SI units)  Child: 1.4 to 1.7 mEq/L  Newborn: 1.4 to 2 mEq/L Possible critical values: less than 0.5 mEq/L or greater than 3 mEq/L Ranges for normal findings may vary among different laboratories and hospitals. You should always check with your caregiver after having lab work or other tests done to discuss the meaning of your test results and whether your values are considered within normal limits. MEANING OF TEST  Your caregiver will go over the test results with you. Your caregiver will discuss the importance and meaning of your results. He or she will also discuss treatment options and additional tests, if needed. OBTAINING THE TEST RESULTS It is your responsibility to obtain your test results. Ask the lab or department performing the test when and how you will get your results. Document Released: 01/10/2005 Document Revised: 03/01/2012 Document Reviewed: 11/18/2008 [facility] [place] [facility] Patient Information 2013 [place], [place].

## 2013-03-08 ENCOUNTER — Encounter: Payer: Self-pay | Admitting: Endocrinology

## 2013-03-08 ENCOUNTER — Ambulatory Visit: Payer: BC Managed Care – PPO | Admitting: Lab

## 2013-03-08 ENCOUNTER — Ambulatory Visit (INDEPENDENT_AMBULATORY_CARE_PROVIDER_SITE_OTHER): Payer: BC Managed Care – PPO | Admitting: Endocrinology

## 2013-03-08 DIAGNOSIS — C9110 Chronic lymphocytic leukemia of B-cell type not having achieved remission: Secondary | ICD-10-CM

## 2013-03-08 DIAGNOSIS — R197 Diarrhea, unspecified: Secondary | ICD-10-CM

## 2013-03-08 LAB — CLOSTRIDIUM DIFFICILE BY PCR: Toxigenic C. Difficile by PCR: POSITIVE — CR

## 2013-03-08 MED ORDER — NATEGLINIDE 120 MG PO TABS: 120.0000 mg | ORAL_TABLET | Freq: Three times a day (TID) | ORAL | Status: DC

## 2013-03-08 NOTE — Patient Instructions (Addendum)
good diet and exercise habits significanly improve the control of your diabetes.  please let me know if you wish to be referred to a dietician.  high blood sugar is very risky to your health.  you should see an eye doctor every year.  You are at higher than average risk for pneumonia and hepatitis-B.  You should be vaccinated against both.   controlling your blood pressure and cholesterol drastically reduces the damage diabetes does to your body.  this also applies to quitting smoking.  please discuss these with your doctor.  you should take an aspirin every day, unless you have been advised by a doctor not to. check your blood sugar once a day.  vary the time of day when you check, between before the 3 meals, and at bedtime.  also check if you have symptoms of your blood sugar being too high or too low.  please keep a record of the readings and bring it to your next appointment here.  please call us sooner if your blood sugar goes below 70, or if you have a lot of readings over 200.  i have sent a prescription to your pharmacy, to add "nateglinide."  Call if the blood sugar stays high, so we can add "[place]." Please come back for a follow-up appointment in 3 months.

## 2013-03-08 NOTE — Progress Notes (Signed)
Subjective:    Patient ID: Timothy [name], male    DOB: Feb 28, 1946, 67 y.o.   MRN: [id number]  HPI Pt has bone-marrow transplant 1 year ago in [place], [place].  He has had elevated glucose since then.  He has never been on medication for the glucose.   He has few mos of intermittent slight bloating of the abdomen, and assoc diarrhea.  This has been attributed to GVH. He has not recently checked cbg at home. Past Medical History  Diagnosis Date  . 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 Diverticular disease   . Hyperlipidemia   . Obesity   . Hypertension   . Hiatal hernia   . CMV (cytomegalovirus) antibody positive     pre-transplant, with seroconversion x2 pst-transplant  . Right bundle branch block     pre-transplant  . CKD (chronic kidney disease) stage 2, GFR 60-89 ml/min   . Pancytopenia   . Steroid-induced diabetes   . Atrial fibrillation     post-transplant  . Myopathy   . 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 Leukemia, chronic lymphoid   . 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 Diarrhea in adult patient 12/05/2012    Due to active GVHD  . CLL (chronic lymphocytic leukemia) 12/05/2012    Dx 07/1999; started Rx 12/06  AlloBMT 3/13  . Rash of face 12/05/2012    Due to GVHD  . Hypomagnesemia 01/26/2013    Past Surgical History  Procedure Laterality Date  . Tonsillectomy and adenoidectomy    . Bone marrow transplant    . Flexible sigmoidoscopy  11/17/2012    Procedure: FLEXIBLE SIGMOIDOSCOPY;  Surgeon: Petra Kuba, MD;  Location: [facility];  Service: Endoscopy;  Laterality: N/A;  Dr Ewing Schlein states will be admitted to rooom 1339 11/16/12  . Esophagogastroduodenoscopy  11/17/2012    Procedure: ESOPHAGOGASTRODUODENOSCOPY (EGD);  Surgeon: Petra Kuba, MD;  Location: [facility];  Service: Endoscopy;  Laterality: N/A;    History   Social History  .  Marital Status: Married    Spouse Name: N/A    Number of Children: 1  . Years of Education: N/A   Occupational History  . PROFESSOR [employer]   Social History Main Topics  . Smoking status: Never Smoker   . Smokeless tobacco: Never Used  . Alcohol Use: No  . Drug Use: No  . Sexually Active:    Other Topics Concern  . 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 Patient currently to his part-time at [employer]. The patient is married to [place], and has been married for 40 years. 1 daughter [name] who lives in [place].    Current Outpatient Prescriptions on File Prior to Visit  Medication Sig Dispense Refill  . acyclovir (ZOVIRAX) 400 MG tablet Take 2 tablets (800 mg total) by mouth 2 (two) times daily.  60 tablet  12  . antiseptic oral rinse (BIOTENE) LIQD 15 mLs by Mouth Rinse route 2 times daily at 12 noon and 4 pm.  237 mL  6  . Beclomethasone Dipropionate POWD 2mg  beclomethasone in corn oil ( 2ml ) four times a day with food. Compounded at [facility] - [phone]  1 Bottle  0  . budesonide (ENTOCORT EC) 3 MG 24 hr capsule Take 3 capsules (9 mg total)  by mouth every morning.  90 capsule  6  . Calcium Citrate (CITRACAL PO) Take 1,200 mg by mouth 2 (two) times daily.      . cholecalciferol (VITAMIN D) 1000 UNITS tablet Take 1,000 Units by mouth every evening.       . cholestyramine (QUESTRAN) 4 G packet Take 1 packet by mouth 2 (two) times daily with a meal.  60 each  1  . diphenoxylate-atropine (LOMOTIL) 2.5-0.025 MG per tablet Take 1 tablet by mouth 4 (four) times daily as needed for diarrhea or loose stools.  60 tablet  1  . dronabinol (MARINOL) 2.5 MG capsule Take 1 capsule (2.5 mg total) by mouth 2 (two) times daily before lunch and supper.  60 capsule  0  . fluconazole (DIFLUCAN) 100 MG tablet Take 1 tablet (100 mg total) by mouth every morning.  30 tablet  12  . HYDROcodone-acetaminophen  (NORCO) 10-325 MG per tablet Take 1 tablet by mouth daily with breakfast.  30 tablet  0  . labetalol (NORMODYNE) 200 MG tablet Take 1 tablet (200 mg total) by mouth 2 (two) times daily.  60 tablet  12  . Lidocaine-Hydrocortisone Ace 3-0.5 % KIT       . lisinopril (PRINIVIL,ZESTRIL) 10 MG tablet Take 10 mg by mouth daily.      . Multiple Vitamins-Minerals (CENTRUM SILVER ADULT 50+) TABS Take 1 tablet by mouth every evening.       . Nutritional Supplements (FEEDING SUPPLEMENT, VITAL 1.5 CAL,) LIQD Take 237 mLs by mouth 2 (two) times daily.  60 Can  6  . omeprazole (PRILOSEC) 40 MG capsule Take 40 mg by mouth daily.      . protein supplement (RESOURCE BENEPROTEIN) POWD Take 6 g by mouth 3 (three) times daily with meals.      . sertraline (ZOLOFT) 50 MG tablet Takes 50 mg one day and 100 mg the next day  90 tablet  3  . Sodium Chloride Flush (NORMAL SALINE FLUSH) 0.9 % SOLN       . sulfamethoxazole-trimethoprim (BACTRIM DS) 800-160 MG per tablet Take 1 tablet by mouth daily.  30 tablet  11  . tacrolimus (PROGRAF) 0.5 MG capsule Take 1.5 mg by mouth 2 (two) times daily.      Marland Kitchen testosterone (ANDRODERM) 4 MG/24HR PT24 patch Place 1 patch onto the skin daily.  30 patch  3   Current Facility-Administered Medications on File Prior to Visit  Medication Dose Route Frequency Provider Last Rate Last Dose  . sodium chloride 0.9 % injection 10 mL  10 mL Intravenous PRN Lowella Dell, MD   10 mL at 08/11/12 1606    Allergies  Allergen Reactions  . Benadryl (Diphenhydramine Hcl)     "Restless leg syndrome"    Family History  Problem Relation Age of Onset  . Cancer Father   DM: none  BP 130/70  Pulse 75  Wt 160 lb (72.576 kg)  BMI 25.05 kg/m2  SpO2 96%  Review of Systems denies headache, chest pain, sob, n/v, urinary frequency, cramps, excessive diaphoresis, memory loss, hypoglycemia, and rhinorrhea. He has lost weight.  He has slight blurry vision and easy bruising.  Depression is  well-controlled.    Objective:   Physical Exam VS: see vs page GEN: no distress HEAD: head: no deformity eyes: no periorbital swelling, no proptosis external nose and ears are normal mouth: no lesion seen NECK: supple, thyroid is not enlarged CHEST WALL: no deformity.  hickman catheter is present LUNGS:  clear to auscultation CV: reg rate and rhythm, no murmur ABD: abdomen is soft, nontender.  no hepatosplenomegaly.  not distended.  no hernia MUSCULOSKELETAL: muscle bulk and strength are grossly normal.  no obvious joint swelling.  gait is normal and steady EXTEMITIES: no deformity.  no ulcer on the feet.  feet are of normal color and temp.  1+ bilat leg edema, and bilat varicosities. PULSES: dorsalis pedis intact bilat.  no carotid bruit NEURO:  cn 2-12 grossly intact.   readily moves all 4's.  sensation is intact to touch on the feet SKIN:  Normal texture and temperature.  No rash or suspicious lesion is visible.  Ecchymoses at the hands. NODES:  None palpable at the neck PSYCH: alert, oriented x3.  Does not appear anxious nor depressed.     Assessment & Plan:  DM, due to steroids, in a susceptible person. Diarrhea, apparently due to GVH.  This is a relative contraindication to metformin, acarbose, and welchol (she is already on [place]). Edema, which is a relative contraindication to pioglitizone. immunocompromised state, for which she takes diflucan. Intermittent renal insufficiency, which is a relative contraindication to glimepiride. Hyperkalemia, which is a relative contraindication to invokana. Hypocalcemia, which may be due to hypoproteinemia.

## 2013-03-09 ENCOUNTER — Encounter: Payer: Self-pay | Admitting: Physician Assistant

## 2013-03-09 ENCOUNTER — Other Ambulatory Visit (HOSPITAL_BASED_OUTPATIENT_CLINIC_OR_DEPARTMENT_OTHER): Payer: BC Managed Care – PPO | Admitting: Lab

## 2013-03-09 ENCOUNTER — Ambulatory Visit (HOSPITAL_BASED_OUTPATIENT_CLINIC_OR_DEPARTMENT_OTHER): Payer: BC Managed Care – PPO

## 2013-03-09 ENCOUNTER — Other Ambulatory Visit: Payer: Self-pay | Admitting: Oncology

## 2013-03-09 ENCOUNTER — Ambulatory Visit: Payer: BC Managed Care – PPO | Admitting: Nutrition

## 2013-03-09 ENCOUNTER — Ambulatory Visit (HOSPITAL_BASED_OUTPATIENT_CLINIC_OR_DEPARTMENT_OTHER): Payer: BC Managed Care – PPO | Admitting: Physician Assistant

## 2013-03-09 ENCOUNTER — Telehealth: Payer: Self-pay | Admitting: *Deleted

## 2013-03-09 VITALS — BP 119/77 | HR 78 | Temp 98.0°F | Resp 20 | Ht 67.0 in | Wt 155.6 lb

## 2013-03-09 DIAGNOSIS — D61818 Other pancytopenia: Secondary | ICD-10-CM

## 2013-03-09 DIAGNOSIS — R112 Nausea with vomiting, unspecified: Secondary | ICD-10-CM

## 2013-03-09 DIAGNOSIS — C9110 Chronic lymphocytic leukemia of B-cell type not having achieved remission: Secondary | ICD-10-CM

## 2013-03-09 DIAGNOSIS — K449 Diaphragmatic hernia without obstruction or gangrene: Secondary | ICD-10-CM

## 2013-03-09 DIAGNOSIS — D801 Nonfamilial hypogammaglobulinemia: Secondary | ICD-10-CM

## 2013-03-09 DIAGNOSIS — R768 Other specified abnormal immunological findings in serum: Secondary | ICD-10-CM

## 2013-03-09 DIAGNOSIS — I4891 Unspecified atrial fibrillation: Secondary | ICD-10-CM

## 2013-03-09 DIAGNOSIS — R894 Abnormal immunological findings in specimens from other organs, systems and tissues: Secondary | ICD-10-CM

## 2013-03-09 DIAGNOSIS — A498 Other bacterial infections of unspecified site: Secondary | ICD-10-CM

## 2013-03-09 DIAGNOSIS — D89811 Chronic graft-versus-host disease: Secondary | ICD-10-CM

## 2013-03-09 DIAGNOSIS — A0472 Enterocolitis due to Clostridium difficile, not specified as recurrent: Secondary | ICD-10-CM

## 2013-03-09 DIAGNOSIS — E099 Drug or chemical induced diabetes mellitus without complications: Secondary | ICD-10-CM

## 2013-03-09 DIAGNOSIS — R21 Rash and other nonspecific skin eruption: Secondary | ICD-10-CM

## 2013-03-09 DIAGNOSIS — Z9489 Other transplanted organ and tissue status: Secondary | ICD-10-CM

## 2013-03-09 LAB — COMPREHENSIVE METABOLIC PANEL (CC13)
ALT: 45 U/L (ref 0–55)
AST: 18 U/L (ref 5–34)
Albumin: 2.6 g/dL — ABNORMAL LOW (ref 3.5–5.0)
Alkaline Phosphatase: 116 U/L (ref 40–150)
Potassium: 5.5 mEq/L — ABNORMAL HIGH (ref 3.5–5.1)
Sodium: 133 mEq/L — ABNORMAL LOW (ref 136–145)
Total Bilirubin: 0.28 mg/dL (ref 0.20–1.20)
Total Protein: 5.1 g/dL — ABNORMAL LOW (ref 6.4–8.3)

## 2013-03-09 LAB — CBC WITH DIFFERENTIAL/PLATELET
BASO%: 0 % (ref 0.0–2.0)
EOS%: 0.2 % (ref 0.0–7.0)
MCH: 30.7 pg (ref 27.2–33.4)
MCV: 92.3 fL (ref 79.3–98.0)
MONO%: 10.7 % (ref 0.0–14.0)
RBC: 4.04 10*6/uL — ABNORMAL LOW (ref 4.20–5.82)
RDW: 15.1 % — ABNORMAL HIGH (ref 11.0–14.6)
lymph#: 1.1 10*3/uL (ref 0.9–3.3)
nRBC: 0 % (ref 0–0)

## 2013-03-09 LAB — LACTATE DEHYDROGENASE (CC13): LDH: 183 U/L (ref 125–245)

## 2013-03-09 MED ORDER — METRONIDAZOLE 500 MG PO TABS: 500.0000 mg | ORAL_TABLET | Freq: Three times a day (TID) | ORAL | Status: DC

## 2013-03-09 MED ORDER — HEPARIN SOD (PORK) LOCK FLUSH 100 UNIT/ML IV SOLN
500.0000 [IU] | INTRAVENOUS | Status: DC | PRN
  Filled 2013-03-09: qty 5

## 2013-03-09 MED ORDER — SODIUM CHLORIDE 0.9 % IJ SOLN
10.0000 mL | INTRAMUSCULAR | Status: DC | PRN
  Filled 2013-03-09: qty 10

## 2013-03-09 MED ORDER — SODIUM CHLORIDE 0.9 % IV SOLN: Freq: Once | INTRAVENOUS | Status: DC

## 2013-03-09 MED ORDER — HEPARIN SOD (PORK) LOCK FLUSH 100 UNIT/ML IV SOLN
250.0000 [IU] | INTRAVENOUS | Status: DC | PRN
  Filled 2013-03-09: qty 5

## 2013-03-09 MED ORDER — SODIUM CHLORIDE 0.9 % IV SOLN
Freq: Once | INTRAVENOUS | Status: AC
  Administered 2013-03-09: 11:00:00 via INTRAVENOUS
  Filled 2013-03-09: qty 500

## 2013-03-09 NOTE — Patient Instructions (Signed)
Hypomagnesemia Magnesium is a common ion (mineral) in the body which is needed for metabolism. It is about how the body handles food and other chemical reactions necessary for life. Only about 2% of the magnesium in our body is found in the blood. When this is low, it is called hypomagnesemia. The blood will measure only a tiny amount of the magnesium in our body. When it is low in our blood, it does not mean that the whole body supply is low. The normal serum concentration ranges from 1.8-2.5 mEq/L. When the level gets to be less than 1.0 mEq/L, a number of problems begin to happen.  CAUSES   Receiving intravenous fluids without magnesium replacement.  Loss of magnesium from the bowel by naso-gastric suction.  Loss of magnesium from nausea and vomiting or severe diarrhea. Any of the inflammatory bowel conditions can cause this.  Abuse of alcohol often leads to low serum magnesium.  An inherited form of magnesium loss happens when the kidneys lose magnesium. This is called familial or primary hypomagnesemia.  Some medications such as diuretics also cause the loss of magnesium. SYMPTOMS  These following problems are worse if the changes in magnesium levels come on suddenly.  Tremor.  Confusion.  Muscle weakness.  Over-sensitive to sights and sounds.  Sensitive reflexes.  Depression.  Muscular fibrillations.  Over-reactivity of the nerves.  Irritability.  Psychosis.  Spasms of the hand muscles.  Tetany (where the muscles go into uncontrollable spasms). DIAGNOSIS  This condition can be diagnosed by blood tests. TREATMENT   In emergency, magnesium can be given intravenously (by vein).  If the condition is less worrisome, it can be corrected by diet. High levels of magnesium are found in green leafy vegetables, peas, beans and nuts among other things. It can also be given through medications by mouth.  If it is being caused by medications, changes can be made.  If  alcohol is a problem, help is available if there are difficulties giving it up. Document Released: 09/03/2005 Document Revised: 03/01/2012 Document Reviewed: 07/28/2008 [employer] Patient Information 2013 [employer].  

## 2013-03-09 NOTE — Telephone Encounter (Signed)
appts made and printed 

## 2013-03-09 NOTE — Progress Notes (Signed)
This is a 67 year old male diagnosed with CLL in August of 2000 status post bone marrow transplant in July of 2013 in [place], [place].  Past medical history includes graft-versus-host disease, diverticular disease, hyperlipidemia, obesity, hypertension, hiatal hernia, chronic kidney disease stage II, and steroid-induced diabetes.  Medications include vitamin D, Citracal, Questran, Lomotil, Marinol, Flagyl, multivitamin, prednisone, and zinc.  Labs include: Positive C. difficile colitis  Height: 67 inches. Weight: 155.6 pounds. Usual body weight: 275 pounds February 2013. BMI: 24.36. (within normal limits)  Patient requesting information on diabetic diet secondary to recently diagnosed steroid-induced diabetes. Patient is struggling with diarrhea as a result of his C. difficile colitis. He is struggling to know what he can eat. Patient has lost 43% of his original body weight over the past 13 months. Patient reports appetite improved now that he is on Marinol.  Nutrition diagnosis: Food and nutrition related knowledge deficit related to new diagnosis of steroid-induced diabetes as evidenced by glucose ranging between 142-206.  Patient meets criteria for severe malnutrition in the context of chronic illness secondary to greater than 20% weight loss in 12 months and less than 75% of estimated energy requirements for greater than one month.  Intervention: Patient and wife were educated on carbohydrate-controlled diet. I've emphasized the importance of patient consuming controlled number of carbohydrates at each meal and snack. He is to monitor portion control. He was educated on strategies for eating with diarrhea resulting from C. difficile colitis. I've reviewed a sample menu with him. I provided fact sheets for him to take with him and answered his questions. Teach back method used.  Monitoring, evaluation, goals: Patient will tolerate a carbohydrate-controlled diet adequate in calories and  protein to promote weight maintenance and adequate glycemic control.  Next visit: Patient will contact me with questions or concerns

## 2013-03-09 NOTE — Progress Notes (Signed)
1130-Pt in private room on contact isolation for C-diff.

## 2013-03-09 NOTE — Progress Notes (Signed)
ID: Timothy [name]   DOB: 04/30/1946  MR#: [id number]  WUJ#:[id number]  NFA:OZHY,Q DOUGLAS, MD SU: OTHER MD: Donzetta Starch  HISTORY OF PRESENT ILLNESS: We have very complete records from Dr. Sydnee Levans in [place], and in summary:  The patient was initially diagnosed in August 2000, with a white cell count of 23,600, but normal hemoglobin and platelets, and no significant symptomatology. Over the next several years his white cell count drifted up, and he eventually developed some symptoms of night sweats in particular, leading to treatment with fludarabine, Cytoxan and rituxan for five cycles given between December 2006 and May 2007.  We have CT scans from June 2006, November 2006 and April 2007, and comparing the November 2006 and April 2007 scans, there was near complete response. He had subsequent therapy in [place] as detailed below, but with decreased response, leading to allogeneic stem-cell transplant at the [facility] 02/24/2012.  INTERVAL HISTORY: Timothy [name] returns with his wife Timothy [name]  for followup of his CLL, status post allogeneic stem cell transplant in [place] March 2013.   Timothy [name] continues to feel weak and tired. His diarrhea worsened, and he was subsequently tested for C. difficile yesterday which was positive. He has been started on Flagyl, 500 mg by mouth 3 times a day which she will take for 10 days. He tells me he has had approximately 6 watery bowel movements since midnight. He is trying to keep himself well hydrated, and is also receiving IV fluids through our office. There's been no blood or mucus in the stool. He denies any evidence of blood in the stool. He's had no fevers or chills. He denies any significant abdominal pain.    REVIEW OF SYSTEMS: Timothy [name] denies any night sweats. He's noticed no skin changes. He recently had an area on his scalp biopsied which showed basal cell carcinoma with Dr. Yetta Barre, and he is scheduled for reexcision of the area on March 25.   (His platelets were a little the low side today, so we will plan on rechecking there's next week prior to the procedure.) He denies any signs of abnormal bleeding. He's had no nausea or emesis. He denies any cough or phlegm production and has shortness of breath only with exertion. He's had no abnormal headaches, dizziness, or change in vision. He continues to have some lower back pain, but has had no new or unusual myalgias, arthralgias, bony pain, or significant peripheral swelling.    A detailed review of systems is otherwise stable and noncontributory.   PAST MEDICAL HISTORY: Past Medical History  Diagnosis Date  . 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 Diverticular disease   . Hyperlipidemia   . Obesity   . Hypertension   . Hiatal hernia   . CMV (cytomegalovirus) antibody positive     pre-transplant, with seroconversion x2 pst-transplant  . Right bundle branch block     pre-transplant  . CKD (chronic kidney disease) stage 2, GFR 60-89 ml/min   . Pancytopenia   . Steroid-induced diabetes   . Atrial fibrillation     post-transplant  . Myopathy   . 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 Leukemia, chronic lymphoid   . 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 Diarrhea in adult patient 12/05/2012    Due to active GVHD  . CLL (chronic lymphocytic leukemia) 12/05/2012    Dx 07/1999; started Rx 12/06  AlloBMT 3/13  .  Rash of face 12/05/2012    Due to GVHD  . Hypomagnesemia 01/26/2013    PAST SURGICAL HISTORY: Past Surgical History  Procedure Laterality Date  . Tonsillectomy and adenoidectomy    . Bone marrow transplant    . Flexible sigmoidoscopy  11/17/2012    Procedure: FLEXIBLE SIGMOIDOSCOPY;  Surgeon: Petra Kuba, MD;  Location: [facility];  Service: Endoscopy;  Laterality: N/A;  Dr Ewing Schlein states will be admitted to rooom 1339 11/16/12  .  Esophagogastroduodenoscopy  11/17/2012    Procedure: ESOPHAGOGASTRODUODENOSCOPY (EGD);  Surgeon: Petra Kuba, MD;  Location: [facility];  Service: Endoscopy;  Laterality: N/A;    FAMILY HISTORY Family History  Problem Relation Age of Onset  . Cancer Father    The patient's father died from complications of chronic lymphocytic leukemia at the age of 30.  It had been diagnosed seven years before when he was 44.  The patient is enrolled in a familial chronic lymphocytic leukemia study out of the [facility].  The patient's mother is 48, alive, unfortunately suffering with dementia, and he has a brother, 72, who is otherwise in fair health.   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  His wife of >40 years, Timothy [name], is a homemaker.  Their daughter, Timothy [name], lives in [place].  She also is a Futures trader.  The patient has an 19 year old grandson and an 66-year-old granddaughter, and that is really the main reason he moved to this area.  He is a International aid/development worker.     ADVANCED DIRECTIVES:  HEALTH MAINTENANCE: History  Substance Use Topics  . Smoking status: Never Smoker   . Smokeless tobacco: Never Used  . Alcohol Use: No     Colonoscopy:  PSA:  Bone density:  Lipid panel:  Allergies  Allergen Reactions  . Benadryl (Diphenhydramine Hcl)     "Restless leg syndrome"    Current Outpatient Prescriptions  Medication Sig Dispense Refill  . acyclovir (ZOVIRAX) 400 MG tablet Take 2 tablets (800 mg total) by mouth 2 (two) times daily.  60 tablet  12  . antiseptic oral rinse (BIOTENE) LIQD 15 mLs by Mouth Rinse route 2 times daily at 12 noon and 4 pm.  237 mL  6  . Beclomethasone Dipropionate POWD 2mg  beclomethasone in corn oil ( 2ml ) four times a day with food. Compounded at [facility] - [phone]  1 Bottle  0  . budesonide (ENTOCORT EC) 3 MG 24 hr  capsule Take 9 mg by mouth 3 (three) times daily.      . Calcium Citrate (CITRACAL PO) Take 1,200 mg by mouth 2 (two) times daily.      . cholecalciferol (VITAMIN D) 1000 UNITS tablet Take 1,000 Units by mouth every evening.       . cholestyramine (QUESTRAN) 4 G packet Take 1 packet by mouth 2 (two) times daily with a meal.  60 each  1  . diphenoxylate-atropine (LOMOTIL) 2.5-0.025 MG per tablet Take 1 tablet by mouth 4 (four) times daily as needed for diarrhea or loose stools.  60 tablet  1  . dronabinol (MARINOL) 2.5 MG capsule Take 1 capsule (2.5 mg total) by mouth 2 (two) times daily before lunch and supper.  60 capsule  0  . fluconazole (DIFLUCAN) 100 MG tablet Take 1 tablet (100 mg total) by mouth every morning.  30 tablet  12  . HYDROcodone-acetaminophen (  NORCO) 10-325 MG per tablet Take 1 tablet by mouth daily with breakfast.  30 tablet  0  . labetalol (NORMODYNE) 200 MG tablet Take 1 tablet (200 mg total) by mouth 2 (two) times daily.  60 tablet  12  . Lidocaine-Hydrocortisone Ace 3-0.5 % KIT       . lisinopril (PRINIVIL,ZESTRIL) 10 MG tablet Take 10 mg by mouth daily.      . metroNIDAZOLE (FLAGYL) 500 MG tablet Take 1 tablet (500 mg total) by mouth 3 (three) times daily.  90 tablet  2  . Multiple Vitamins-Minerals (CENTRUM SILVER ADULT 50+) TABS Take 1 tablet by mouth every evening.       . nateglinide (STARLIX) 120 MG tablet Take 1 tablet (120 mg total) by mouth 3 (three) times daily before meals.  90 tablet  11  . Nutritional Supplements (FEEDING SUPPLEMENT, VITAL 1.5 CAL,) LIQD Take 237 mLs by mouth 2 (two) times daily.  60 Can  6  . omeprazole (PRILOSEC) 40 MG capsule Take 40 mg by mouth daily.      . predniSONE (DELTASONE) 20 MG tablet Take 50 mg by mouth daily with breakfast.      . protein supplement (RESOURCE BENEPROTEIN) POWD Take 6 g by mouth 3 (three) times daily with meals.      . sertraline (ZOLOFT) 50 MG tablet Takes 50 mg one day and 100 mg the next day  90 tablet  3  .  Sodium Chloride Flush (NORMAL SALINE FLUSH) 0.9 % SOLN       . sulfamethoxazole-trimethoprim (BACTRIM DS) 800-160 MG per tablet Take 1 tablet by mouth daily.  30 tablet  11  . tacrolimus (PROGRAF) 0.5 MG capsule Take 1.5 mg by mouth 2 (two) times daily.      Marland Kitchen testosterone (ANDRODERM) 4 MG/24HR PT24 patch Place 1 patch onto the skin daily.  30 patch  3  . zinc gluconate 50 MG tablet Take 50 mg by mouth daily.       No current facility-administered medications for this visit.   Facility-Administered Medications Ordered in Other Visits  Medication Dose Route Frequency Provider Last Rate Last Dose  . 0.9 %  sodium chloride infusion   Intravenous Once Lowella Dell, MD      . heparin lock flush 100 unit/mL  250 Units Intracatheter PRN Lowella Dell, MD      . heparin lock flush 100 unit/mL  500 Units Intracatheter PRN Lowella Dell, MD      . sodium chloride 0.9 % injection 10 mL  10 mL Intravenous PRN Lowella Dell, MD   10 mL at 08/11/12 1606  . sodium chloride 0.9 % injection 10 mL  10 mL Intracatheter PRN Lowella Dell, MD      . sodium chloride 0.9 % injection 10 mL  10 mL Intracatheter PRN Lowella Dell, MD      . sodium chloride 0.9 % injection 10 mL  10 mL Intracatheter PRN Lowella Dell, MD        OBJECTIVE: Middle-aged white male who appears weak and tired but is in no acute distress Filed Vitals:   03/09/13 1018  BP: 119/77  Pulse: 78  Temp: 98 F (36.7 C)  Resp: 20     Body mass index is 24.36 kg/(m^2).    Filed Weights   03/09/13 1018  Weight: 155 lb 9.6 oz (70.58 kg)   ECOG FS: 2  Full physical exam was deferred today   LAB  RESULTS:      Chemistry      Component Value Date/Time   NA 133* 02/28/2013 1343   NA 136 12/27/2012 0440   K 5.5* 02/28/2013 1343   K 3.9 12/27/2012 0440   CL 110* 02/28/2013 1343   CL 108 12/27/2012 0440   CO2 15 Repeated and Verified* 02/28/2013 1343   CO2 22 12/27/2012 0440   BUN 53.6* 02/28/2013 1343   BUN 19  12/27/2012 0440   CREATININE 1.0 02/28/2013 1343   CREATININE 0.63 12/27/2012 0440      Component Value Date/Time   CALCIUM 8.3* 02/28/2013 1343   CALCIUM 6.9* 12/27/2012 0440   ALKPHOS 119 02/28/2013 1343   ALKPHOS 91 12/27/2012 0440   AST 23 02/28/2013 1343   AST 18 12/27/2012 0440   ALT 48 02/28/2013 1343   ALT 18 12/27/2012 0440   BILITOT 0.32 02/28/2013 1343   BILITOT 0.2* 12/27/2012 0440      STUDIES:  No results found.   ASSESSMENT: 67 y.o. [place] man with a history of well-differentiated lymphocytic lymphoma/ chronic lymphoid leukemia initially diagnosed in 2000, not requiring intervention until 2006; with multiple chromosomal abnormalities.  His treatment history is as follows:  (1) fludarabine/cyclophosphamide/rituximab x5 completed May 2007.   (2) rituximab for 8 doses October 2010, with partial response   (3) Leustatin and ofatumumab weekly x8 July to September 2011 followed by maintenance ofatumumab maintenance ofatumumab every 2 months, with initial response but rising counts September 2012   (4) status-post unrelated donor stem-cell transplant 02/24/2012 at the [facility]  (a) conditioning regimen consisted of fludarabine + TBI at 200 cGy, followed by rituximab x27;  (b) CMV reactivation x3 (patient CMV positive, donor negative), s/p ganciclovir treatment; 3d reactivation August 2013, s/p gancyclovir, with negative PCR mid-September 2013; last gancyclovir dose 10/06/2012 (c) Chronic GVHD: involving gut and skin, treated with steroids, tacrolimus and MMF.  MMF was eventually d/c'd and tacrolimus currently at a dose of 1.5mg  BID (d) atrial fibrillation: resolved on brief amiodarone regimen (e) steroid-induced myopathy: improving  (f) hypomagnesemia: improved after d/c gancyclovir (g) hypogammaglobulinemia: s/p IVIG most recently 01/07/2013. (h) history of elevated triglycerides (606 on 07/14/2012)  (i) adrenal insufficiency: on prednisone and budesonide (j) pancytopenia,  improved  (5) restaging studies September 2013  including CT scans, flow cytometry, and bone marrow biopsy, showed no evidence of residual chronic lymphoid leukemia.  (6) recurrent GVHD (skin rash, mouth changes, severe diarrhea and gastric/duodenal/colonic biopsies 11/17/2012 c/w GVHD grade 2) now only remaining sign is bothersome diarrhea; c diff negative x3; continuing current regimen   (7) severe malnutrition -- on VITAL supplement in addition to regular diet; on Marinol for anorexia  (8) testosterone deficiency--on patch   (9) deconditioning: ongoing REHAB   (10) mild dehydration: encouraged increased po fluids; receives IVF support w magnesium every Mon, Th and Sat  (11) steroid-induced osteoporosis with compression fractures: received pamidronate 12/18/2012   (12) nausea: improved on current meds  (13)  Positive c.diff, 03/08/2013, on Flagyl 500 mg TID x 10 days  PLAN: Nichael will continue to receive IV fluids with magnesium 3 times weekly. I am awaiting his magnesium level today, and if it is still near the higher end of normal, we will likely drop the magnesium dose back to twice weekly.  We will monitor his basic labs on a weekly basis, tacrolimus every 3 weeks, and his IgG on a monthly basis.   He will continue on his Flagyl as directed, 3 times daily x10 days. I  also encouraged him to continue the Questran, but to avoid the Lomotil or Imodium until this clears.  I also cautioned his wife Timothy [name] about avoiding exposure to the C. Difficile, and should she develop diarrhea, she knows to contact her primary care physician immediately.   He is also scheduled to meet with our nutritionist today to discuss a diabetic diet. He'll continue to be followed by Dr. Everardo All as well, and is planning to initiate Januvia today or tomorrow as directed.  Timothy [name]  will need a repeat CT of the abdomen and pelvis in about 3 months, and a colonoscopy sometime this spring. He will be back in [place] in 6  months.  In the meanwhile, he knows to call with any changes or problems.   Zollie Scale    03/09/2013

## 2013-03-09 NOTE — Progress Notes (Signed)
Late entry:   Pt iv fluids were given over two hours.   dmr

## 2013-03-10 ENCOUNTER — Other Ambulatory Visit: Payer: Self-pay | Admitting: Physician Assistant

## 2013-03-10 ENCOUNTER — Ambulatory Visit: Payer: Self-pay | Admitting: *Deleted

## 2013-03-11 ENCOUNTER — Other Ambulatory Visit: Payer: Self-pay | Admitting: Physician Assistant

## 2013-03-12 ENCOUNTER — Ambulatory Visit: Payer: BC Managed Care – PPO

## 2013-03-12 VITALS — BP 148/89 | HR 69 | Temp 97.2°F

## 2013-03-12 DIAGNOSIS — R112 Nausea with vomiting, unspecified: Secondary | ICD-10-CM

## 2013-03-12 DIAGNOSIS — E860 Dehydration: Secondary | ICD-10-CM

## 2013-03-12 DIAGNOSIS — D89811 Chronic graft-versus-host disease: Secondary | ICD-10-CM

## 2013-03-12 MED ORDER — SODIUM CHLORIDE 0.9 % IV SOLN
INTRAVENOUS | Status: DC
  Administered 2013-03-12: 09:00:00 via INTRAVENOUS

## 2013-03-12 NOTE — Patient Instructions (Signed)
Dehydration, Adult Dehydration is when you lose more fluids from the body than you take in. Vital organs like the kidneys, brain, and heart cannot function without a proper amount of fluids and salt. Any loss of fluids from the body can cause dehydration.  CAUSES   Vomiting.  Diarrhea.  Excessive sweating.  Excessive urine output.  Fever. SYMPTOMS  Mild dehydration  Thirst.  Dry lips.  Slightly dry mouth. Moderate dehydration  Very dry mouth.  Sunken eyes.  Skin does not bounce back quickly when lightly pinched and released.  Dark urine and decreased urine production.  Decreased tear production.  Headache. Severe dehydration  Very dry mouth.  Extreme thirst.  Rapid, weak pulse (more than 100 beats per minute at rest).  Cold hands and feet.  Not able to sweat in spite of heat and temperature.  Rapid breathing.  Blue lips.  Confusion and lethargy.  Difficulty being awakened.  Minimal urine production.  No tears. DIAGNOSIS  Your caregiver will diagnose dehydration based on your symptoms and your exam. Blood and urine tests will help confirm the diagnosis. The diagnostic evaluation should also identify the cause of dehydration. TREATMENT  Treatment of mild or moderate dehydration can often be done at home by increasing the amount of fluids that you drink. It is best to drink small amounts of fluid more often. Drinking too much at one time can make vomiting worse. Refer to the home care instructions below. Severe dehydration needs to be treated at the hospital where you will probably be given intravenous (IV) fluids that contain water and electrolytes. HOME CARE INSTRUCTIONS   Ask your caregiver about specific rehydration instructions.  Drink enough fluids to keep your urine clear or pale yellow.  Drink small amounts frequently if you have nausea and vomiting.  Eat as you normally do.  Avoid:  Foods or drinks high in sugar.  Carbonated  drinks.  Juice.  Extremely hot or cold fluids.  Drinks with caffeine.  Fatty, greasy foods.  Alcohol.  Tobacco.  Overeating.  Gelatin desserts.  Wash your hands well to avoid spreading bacteria and viruses.  Only take over-the-counter or prescription medicines for pain, discomfort, or fever as directed by your caregiver.  Ask your caregiver if you should continue all prescribed and over-the-counter medicines.  Keep all follow-up appointments with your caregiver. SEEK MEDICAL CARE IF:  You have abdominal pain and it increases or stays in one area (localizes).  You have a rash, stiff neck, or severe headache.  You are irritable, sleepy, or difficult to awaken.  You are weak, dizzy, or extremely thirsty. SEEK IMMEDIATE MEDICAL CARE IF:   You are unable to keep fluids down or you get worse despite treatment.  You have frequent episodes of vomiting or diarrhea.  You have blood or green matter (bile) in your vomit.  You have blood in your stool or your stool looks black and tarry.  You have not urinated in 6 to 8 hours, or you have only urinated a small amount of very dark urine.  You have a fever.  You faint. MAKE SURE YOU:   Understand these instructions.  Will watch your condition.  Will get help right away if you are not doing well or get worse. Document Released: 12/08/2005 Document Revised: 03/01/2012 Document Reviewed: 07/28/2011 Exit[employer] Patient Information 2013 [employer].  

## 2013-03-14 ENCOUNTER — Other Ambulatory Visit: Payer: Self-pay | Admitting: Lab

## 2013-03-14 ENCOUNTER — Ambulatory Visit (HOSPITAL_BASED_OUTPATIENT_CLINIC_OR_DEPARTMENT_OTHER): Payer: BC Managed Care – PPO

## 2013-03-14 ENCOUNTER — Other Ambulatory Visit (HOSPITAL_BASED_OUTPATIENT_CLINIC_OR_DEPARTMENT_OTHER): Payer: BC Managed Care – PPO

## 2013-03-14 ENCOUNTER — Ambulatory Visit: Payer: Self-pay | Admitting: Oncology

## 2013-03-14 DIAGNOSIS — R768 Other specified abnormal immunological findings in serum: Secondary | ICD-10-CM

## 2013-03-14 DIAGNOSIS — C9110 Chronic lymphocytic leukemia of B-cell type not having achieved remission: Secondary | ICD-10-CM

## 2013-03-14 DIAGNOSIS — D89811 Chronic graft-versus-host disease: Secondary | ICD-10-CM

## 2013-03-14 DIAGNOSIS — I4891 Unspecified atrial fibrillation: Secondary | ICD-10-CM

## 2013-03-14 DIAGNOSIS — R894 Abnormal immunological findings in specimens from other organs, systems and tissues: Secondary | ICD-10-CM

## 2013-03-14 DIAGNOSIS — D61818 Other pancytopenia: Secondary | ICD-10-CM

## 2013-03-14 DIAGNOSIS — K449 Diaphragmatic hernia without obstruction or gangrene: Secondary | ICD-10-CM

## 2013-03-14 DIAGNOSIS — R21 Rash and other nonspecific skin eruption: Secondary | ICD-10-CM

## 2013-03-14 DIAGNOSIS — R112 Nausea with vomiting, unspecified: Secondary | ICD-10-CM

## 2013-03-14 LAB — COMPREHENSIVE METABOLIC PANEL (CC13)
ALT: 39 U/L (ref 0–55)
Alkaline Phosphatase: 102 U/L (ref 40–150)
CO2: 10 mEq/L — ABNORMAL LOW (ref 22–29)
Sodium: 132 mEq/L — ABNORMAL LOW (ref 136–145)
Total Bilirubin: 0.22 mg/dL (ref 0.20–1.20)
Total Protein: 4.8 g/dL — ABNORMAL LOW (ref 6.4–8.3)

## 2013-03-14 LAB — CBC WITH DIFFERENTIAL/PLATELET
Basophils Absolute: 0 10*3/uL (ref 0.0–0.1)
EOS%: 0 % (ref 0.0–7.0)
HGB: 12.8 g/dL — ABNORMAL LOW (ref 13.0–17.1)
MCH: 30.5 pg (ref 27.2–33.4)
MONO#: 0.4 10*3/uL (ref 0.1–0.9)
NEUT#: 5 10*3/uL (ref 1.5–6.5)
RDW: 14.8 % — ABNORMAL HIGH (ref 11.0–14.6)
WBC: 6.6 10*3/uL (ref 4.0–10.3)
lymph#: 1.2 10*3/uL (ref 0.9–3.3)

## 2013-03-14 MED ORDER — SODIUM CHLORIDE 0.9 % IJ SOLN
10.0000 mL | INTRAMUSCULAR | Status: DC | PRN
  Filled 2013-03-14: qty 10

## 2013-03-14 MED ORDER — HEPARIN SOD (PORK) LOCK FLUSH 100 UNIT/ML IV SOLN
500.0000 [IU] | Freq: Once | INTRAVENOUS | Status: DC
  Filled 2013-03-14: qty 5

## 2013-03-14 MED ORDER — SODIUM CHLORIDE 0.9 % IV SOLN
Freq: Once | INTRAVENOUS | Status: AC
  Administered 2013-03-14: 12:00:00 via INTRAVENOUS
  Filled 2013-03-14: qty 500

## 2013-03-14 MED ORDER — SODIUM CHLORIDE 0.9 % IV SOLN
Freq: Once | INTRAVENOUS | Status: AC
  Administered 2013-03-14: 11:00:00 via INTRAVENOUS

## 2013-03-14 NOTE — Patient Instructions (Addendum)
Hypomagnesemia Magnesium is a common ion (mineral) in the body which is needed for metabolism. It is about how the body handles food and other chemical reactions necessary for life. Only about 2% of the magnesium in our body is found in the blood. When this is low, it is called hypomagnesemia. The blood will measure only a tiny amount of the magnesium in our body. When it is low in our blood, it does not mean that the whole body supply is low. The normal serum concentration ranges from 1.8-2.5 mEq/L. When the level gets to be less than 1.0 mEq/L, a number of problems begin to happen.  CAUSES   Receiving intravenous fluids without magnesium replacement.  Loss of magnesium from the bowel by naso-gastric suction.  Loss of magnesium from nausea and vomiting or severe diarrhea. Any of the inflammatory bowel conditions can cause this.  Abuse of alcohol often leads to low serum magnesium.  An inherited form of magnesium loss happens when the kidneys lose magnesium. This is called familial or primary hypomagnesemia.  Some medications such as diuretics also cause the loss of magnesium. SYMPTOMS  These following problems are worse if the changes in magnesium levels come on suddenly.  Tremor.  Confusion.  Muscle weakness.  Over-sensitive to sights and sounds.  Sensitive reflexes.  Depression.  Muscular fibrillations.  Over-reactivity of the nerves.  Irritability.  Psychosis.  Spasms of the hand muscles.  Tetany (where the muscles go into uncontrollable spasms). DIAGNOSIS  This condition can be diagnosed by blood tests. TREATMENT   In emergency, magnesium can be given intravenously (by vein).  If the condition is less worrisome, it can be corrected by diet. High levels of magnesium are found in green leafy vegetables, peas, beans and nuts among other things. It can also be given through medications by mouth.  If it is being caused by medications, changes can be made.  If  alcohol is a problem, help is available if there are difficulties giving it up. Document Released: 09/03/2005 Document Revised: 03/01/2012 Document Reviewed: 07/28/2008 [facility] Patient Information 2013 [place], [place].   Discharge Instructions for Patients  Today you received the following: IV fluids with magnesium   BELOW ARE SYMPTOMS THAT SHOULD BE REPORTED IMMEDIATELY:  *FEVER GREATER THAN 100.5 F  *CHILLS WITH OR WITHOUT FEVER  NAUSEA AND VOMITING THAT IS NOT CONTROLLED WITH YOUR NAUSEA MEDICATION  *UNUSUAL SHORTNESS OF BREATH  *UNUSUAL BRUISING OR BLEEDING  TENDERNESS IN MOUTH AND THROAT WITH OR WITHOUT PRESENCE OF ULCERS  *URINARY PROBLEMS  *BOWEL PROBLEMS  UNUSUAL RASH Items with * indicate a potential emergency and should be followed up as soon as possible.   Feel free to call the clinic you have any questions or concerns. The clinic phone number is [phone].

## 2013-03-14 NOTE — Progress Notes (Signed)
Pt instructed that plt count ok for re-excision procedure tomorrow, per Dr. Darnelle Catalan.

## 2013-03-15 ENCOUNTER — Telehealth: Payer: Self-pay | Admitting: *Deleted

## 2013-03-15 LAB — TACROLIMUS LEVEL: Tacrolimus Lvl: 9.8 ng/mL (ref 5.0–20.0)

## 2013-03-15 LAB — IGG, IGA, IGM: IgG (Immunoglobin G), Serum: 308 mg/dL — ABNORMAL LOW (ref 650–1600)

## 2013-03-15 NOTE — Telephone Encounter (Signed)
Called and spoke with patient. Per Dr. Darnelle Catalan I explained to the patient that he would like to admit patient for his uncontrolled steroid induced diabetes.  Bed control was called and instructed him to come to admitting in the am.  Patient verbalized understanding.

## 2013-03-16 ENCOUNTER — Encounter (HOSPITAL_COMMUNITY): Payer: Self-pay

## 2013-03-16 ENCOUNTER — Telehealth: Payer: Self-pay | Admitting: *Deleted

## 2013-03-16 ENCOUNTER — Inpatient Hospital Stay (HOSPITAL_COMMUNITY)
Admission: AD | Admit: 2013-03-16 | Discharge: 2013-03-18 | DRG: 566 | Disposition: A | Discharge status: Home / Self Care | Payer: BC Managed Care – PPO | Source: Ambulatory Visit | Attending: Oncology | Admitting: Oncology

## 2013-03-16 DIAGNOSIS — Z9489 Other transplanted organ and tissue status: Secondary | ICD-10-CM

## 2013-03-16 DIAGNOSIS — N182 Chronic kidney disease, stage 2 (mild): Secondary | ICD-10-CM

## 2013-03-16 DIAGNOSIS — Z856 Personal history of leukemia: Secondary | ICD-10-CM

## 2013-03-16 DIAGNOSIS — R197 Diarrhea, unspecified: Secondary | ICD-10-CM

## 2013-03-16 DIAGNOSIS — Z9481 Bone marrow transplant status: Secondary | ICD-10-CM

## 2013-03-16 DIAGNOSIS — K449 Diaphragmatic hernia without obstruction or gangrene: Secondary | ICD-10-CM

## 2013-03-16 DIAGNOSIS — D89811 Chronic graft-versus-host disease: Secondary | ICD-10-CM

## 2013-03-16 DIAGNOSIS — K5790 Diverticulosis of intestine, part unspecified, without perforation or abscess without bleeding: Secondary | ICD-10-CM

## 2013-03-16 DIAGNOSIS — R7689 Other specified abnormal immunological findings in serum: Secondary | ICD-10-CM

## 2013-03-16 DIAGNOSIS — M8970 Major osseous defect, unspecified site: Secondary | ICD-10-CM | POA: Diagnosis present

## 2013-03-16 DIAGNOSIS — D61818 Other pancytopenia: Secondary | ICD-10-CM

## 2013-03-16 DIAGNOSIS — IMO0002 Reserved for concepts with insufficient information to code with codable children: Secondary | ICD-10-CM

## 2013-03-16 DIAGNOSIS — I10 Essential (primary) hypertension: Secondary | ICD-10-CM

## 2013-03-16 DIAGNOSIS — E099 Drug or chemical induced diabetes mellitus without complications: Secondary | ICD-10-CM

## 2013-03-16 DIAGNOSIS — E669 Obesity, unspecified: Secondary | ICD-10-CM

## 2013-03-16 DIAGNOSIS — E43 Unspecified severe protein-calorie malnutrition: Secondary | ICD-10-CM | POA: Diagnosis present

## 2013-03-16 DIAGNOSIS — E785 Hyperlipidemia, unspecified: Secondary | ICD-10-CM

## 2013-03-16 DIAGNOSIS — E860 Dehydration: Secondary | ICD-10-CM | POA: Diagnosis present

## 2013-03-16 DIAGNOSIS — M818 Other osteoporosis without current pathological fracture: Secondary | ICD-10-CM | POA: Diagnosis present

## 2013-03-16 DIAGNOSIS — E1365 Other specified diabetes mellitus with hyperglycemia: Secondary | ICD-10-CM

## 2013-03-16 DIAGNOSIS — I4510 Unspecified right bundle-branch block: Secondary | ICD-10-CM

## 2013-03-16 DIAGNOSIS — T380X5A Adverse effect of glucocorticoids and synthetic analogues, initial encounter: Secondary | ICD-10-CM | POA: Diagnosis present

## 2013-03-16 DIAGNOSIS — C9110 Chronic lymphocytic leukemia of B-cell type not having achieved remission: Secondary | ICD-10-CM

## 2013-03-16 DIAGNOSIS — D801 Nonfamilial hypogammaglobulinemia: Secondary | ICD-10-CM | POA: Diagnosis present

## 2013-03-16 DIAGNOSIS — R768 Other specified abnormal immunological findings in serum: Secondary | ICD-10-CM

## 2013-03-16 DIAGNOSIS — A0472 Enterocolitis due to Clostridium difficile, not specified as recurrent: Secondary | ICD-10-CM | POA: Diagnosis present

## 2013-03-16 DIAGNOSIS — E138 Other specified diabetes mellitus with unspecified complications: Principal | ICD-10-CM | POA: Diagnosis present

## 2013-03-16 DIAGNOSIS — G722 Myopathy due to other toxic agents: Secondary | ICD-10-CM | POA: Diagnosis present

## 2013-03-16 DIAGNOSIS — Z87311 Personal history of (healed) other pathological fracture: Secondary | ICD-10-CM

## 2013-03-16 DIAGNOSIS — R894 Abnormal immunological findings in specimens from other organs, systems and tissues: Secondary | ICD-10-CM

## 2013-03-16 DIAGNOSIS — Z79899 Other long term (current) drug therapy: Secondary | ICD-10-CM

## 2013-03-16 DIAGNOSIS — I129 Hypertensive chronic kidney disease with stage 1 through stage 4 chronic kidney disease, or unspecified chronic kidney disease: Secondary | ICD-10-CM | POA: Diagnosis present

## 2013-03-16 LAB — CBC WITH DIFFERENTIAL/PLATELET
Eosinophils Absolute: 0 10*3/uL (ref 0.0–0.7)
Eosinophils Relative: 0 % (ref 0–5)
Hemoglobin: 11.7 g/dL — ABNORMAL LOW (ref 13.0–17.0)
Lymphocytes Relative: 10 % — ABNORMAL LOW (ref 12–46)
Lymphs Abs: 0.6 10*3/uL — ABNORMAL LOW (ref 0.7–4.0)
MCH: 30.6 pg (ref 26.0–34.0)
MCV: 91.4 fL (ref 78.0–100.0)
Monocytes Relative: 2 % — ABNORMAL LOW (ref 3–12)
RBC: 3.82 MIL/uL — ABNORMAL LOW (ref 4.22–5.81)
WBC: 5.9 10*3/uL (ref 4.0–10.5)

## 2013-03-16 LAB — URINALYSIS, ROUTINE W REFLEX MICROSCOPIC
Bilirubin Urine: NEGATIVE
Glucose, UA: >1000 mg/dL — AB
Hgb urine dipstick: NEGATIVE
Specific Gravity, Urine: 1.025 (ref 1.005–1.030)
Urobilinogen, UA: 0.2 mg/dL (ref 0.0–1.0)

## 2013-03-16 LAB — URINE MICROSCOPIC-ADD ON

## 2013-03-16 LAB — COMPREHENSIVE METABOLIC PANEL
ALT: 33 U/L (ref 0–53)
AST: 14 U/L (ref 0–37)
Albumin: 2.5 g/dL — ABNORMAL LOW (ref 3.5–5.2)
Alkaline Phosphatase: 90 U/L (ref 39–117)
Potassium: 5.6 mEq/L — ABNORMAL HIGH (ref 3.5–5.1)
Sodium: 130 mEq/L — ABNORMAL LOW (ref 135–145)
Total Protein: 4.4 g/dL — ABNORMAL LOW (ref 6.0–8.3)

## 2013-03-16 LAB — GLUCOSE, CAPILLARY
Glucose-Capillary: 326 mg/dL — ABNORMAL HIGH (ref 70–99)
Glucose-Capillary: 351 mg/dL — ABNORMAL HIGH (ref 70–99)
Glucose-Capillary: 339 mg/dL — ABNORMAL HIGH (ref 70–99)

## 2013-03-16 LAB — TSH: TSH: 0.535 u[IU]/mL (ref 0.350–4.500)

## 2013-03-16 LAB — RETICULOCYTES
RBC.: 3.82 MIL/uL — ABNORMAL LOW (ref 4.22–5.81)
Retic Ct Pct: 0.8 % (ref 0.4–3.1)

## 2013-03-16 LAB — PROTIME-INR: INR: 1.1 (ref 0.00–1.49)

## 2013-03-16 MED ORDER — PREDNISONE 50 MG PO TABS
50.0000 mg | ORAL_TABLET | Freq: Every day | ORAL | Status: DC
  Administered 2013-03-17 – 2013-03-18 (×2): 50 mg via ORAL
  Filled 2013-03-16 (×3): qty 1

## 2013-03-16 MED ORDER — BENEPROTEIN PO POWD
1.0000 | Freq: Three times a day (TID) | ORAL | Status: DC
  Filled 2013-03-16: qty 227

## 2013-03-16 MED ORDER — VITAMIN D3 25 MCG (1000 UNIT) PO TABS
1000.0000 [IU] | ORAL_TABLET | Freq: Every evening | ORAL | Status: DC
  Administered 2013-03-16 – 2013-03-17 (×2): 1000 [IU] via ORAL
  Filled 2013-03-16 (×3): qty 1

## 2013-03-16 MED ORDER — SODIUM CHLORIDE 0.9 % IV SOLN
INTRAVENOUS | Status: DC
  Administered 2013-03-16 – 2013-03-18 (×5): via INTRAVENOUS

## 2013-03-16 MED ORDER — ENOXAPARIN SODIUM 40 MG/0.4ML ~~LOC~~ SOLN
40.0000 mg | SUBCUTANEOUS | Status: DC
  Administered 2013-03-16 – 2013-03-18 (×3): 40 mg via SUBCUTANEOUS
  Filled 2013-03-16 (×3): qty 0.4

## 2013-03-16 MED ORDER — METRONIDAZOLE 500 MG PO TABS
500.0000 mg | ORAL_TABLET | Freq: Three times a day (TID) | ORAL | Status: DC
  Administered 2013-03-16 – 2013-03-18 (×6): 500 mg via ORAL
  Filled 2013-03-16 (×9): qty 1

## 2013-03-16 MED ORDER — SERTRALINE HCL 100 MG PO TABS
100.0000 mg | ORAL_TABLET | Freq: Every day | ORAL | Status: DC
  Administered 2013-03-17 – 2013-03-18 (×2): 100 mg via ORAL
  Filled 2013-03-16 (×2): qty 1

## 2013-03-16 MED ORDER — NATEGLINIDE 120 MG PO TABS
120.0000 mg | ORAL_TABLET | Freq: Three times a day (TID) | ORAL | Status: DC
  Administered 2013-03-17 – 2013-03-18 (×5): 120 mg via ORAL
  Filled 2013-03-16 (×9): qty 1

## 2013-03-16 MED ORDER — RESOURCE INSTANT PROTEIN PO PWD PACKET
6.0000 g | Freq: Three times a day (TID) | ORAL | Status: DC
  Administered 2013-03-16 – 2013-03-18 (×6): 6 g via ORAL
  Filled 2013-03-16 (×8): qty 6

## 2013-03-16 MED ORDER — BIOTENE DRY MOUTH MT LIQD
15.0000 mL | Freq: Two times a day (BID) | OROMUCOSAL | Status: DC
  Administered 2013-03-16 – 2013-03-18 (×4): 15 mL via OROMUCOSAL

## 2013-03-16 MED ORDER — HYDROCODONE-ACETAMINOPHEN 10-325 MG PO TABS: 0.5000 | ORAL_TABLET | Freq: Every day | ORAL | Status: DC | PRN

## 2013-03-16 MED ORDER — MUPIROCIN CALCIUM 2 % EX CREA
TOPICAL_CREAM | Freq: Once | CUTANEOUS | Status: AC
  Administered 2013-03-17: 11:00:00 via TOPICAL
  Filled 2013-03-16: qty 15

## 2013-03-16 MED ORDER — DRONABINOL 2.5 MG PO CAPS
2.5000 mg | ORAL_CAPSULE | Freq: Two times a day (BID) | ORAL | Status: DC
  Administered 2013-03-16 – 2013-03-18 (×3): 2.5 mg via ORAL
  Filled 2013-03-16 (×3): qty 1

## 2013-03-16 MED ORDER — SULFAMETHOXAZOLE-TMP DS 800-160 MG PO TABS
1.0000 | ORAL_TABLET | Freq: Every day | ORAL | Status: DC
  Administered 2013-03-17 – 2013-03-18 (×2): 1 via ORAL
  Filled 2013-03-16 (×2): qty 1

## 2013-03-16 MED ORDER — ACYCLOVIR 800 MG PO TABS
800.0000 mg | ORAL_TABLET | Freq: Two times a day (BID) | ORAL | Status: DC
  Administered 2013-03-16 – 2013-03-18 (×4): 800 mg via ORAL
  Filled 2013-03-16 (×5): qty 1

## 2013-03-16 MED ORDER — DIPHENOXYLATE-ATROPINE 2.5-0.025 MG PO TABS: 1.0000 | ORAL_TABLET | Freq: Four times a day (QID) | ORAL | Status: DC | PRN

## 2013-03-16 MED ORDER — INSULIN ASPART 100 UNIT/ML ~~LOC~~ SOLN
0.0000 [IU] | Freq: Three times a day (TID) | SUBCUTANEOUS | Status: DC
  Administered 2013-03-16: 15 [IU] via SUBCUTANEOUS
  Administered 2013-03-17: 5 [IU] via SUBCUTANEOUS
  Administered 2013-03-17: 8 [IU] via SUBCUTANEOUS
  Administered 2013-03-17: 11 [IU] via SUBCUTANEOUS
  Administered 2013-03-18 (×2): 5 [IU] via SUBCUTANEOUS

## 2013-03-16 MED ORDER — TACROLIMUS 1 MG PO CAPS
1.5000 mg | ORAL_CAPSULE | Freq: Two times a day (BID) | ORAL | Status: DC
  Administered 2013-03-16 – 2013-03-18 (×4): 1.5 mg via ORAL
  Filled 2013-03-16 (×5): qty 1

## 2013-03-16 MED ORDER — LISINOPRIL 10 MG PO TABS
10.0000 mg | ORAL_TABLET | ORAL | Status: DC
  Filled 2013-03-16: qty 1

## 2013-03-16 MED ORDER — CHOLESTYRAMINE 4 G PO PACK
1.0000 | PACK | Freq: Two times a day (BID) | ORAL | Status: DC
  Administered 2013-03-16 – 2013-03-18 (×4): 1 via ORAL
  Filled 2013-03-16 (×6): qty 1

## 2013-03-16 MED ORDER — POTASSIUM CHLORIDE IN NACL 40-0.9 MEQ/L-% IV SOLN
INTRAVENOUS | Status: DC
  Administered 2013-03-16: 15:00:00 via INTRAVENOUS
  Filled 2013-03-16 (×2): qty 1000

## 2013-03-16 MED ORDER — ADULT MULTIVITAMIN W/MINERALS CH
1.0000 | ORAL_TABLET | Freq: Every day | ORAL | Status: DC
  Administered 2013-03-17 – 2013-03-18 (×2): 1 via ORAL
  Filled 2013-03-16 (×2): qty 1

## 2013-03-16 MED ORDER — PANTOPRAZOLE SODIUM 40 MG PO TBEC: 40.0000 mg | DELAYED_RELEASE_TABLET | Freq: Every day | ORAL | Status: DC

## 2013-03-16 MED ORDER — FLUCONAZOLE 100 MG PO TABS
100.0000 mg | ORAL_TABLET | Freq: Every morning | ORAL | Status: DC
  Administered 2013-03-17 – 2013-03-18 (×2): 100 mg via ORAL
  Filled 2013-03-16 (×2): qty 1

## 2013-03-16 MED ORDER — ZINC GLUCONATE 50 MG PO TABS
50.0000 mg | ORAL_TABLET | Freq: Every day | ORAL | Status: DC
  Administered 2013-03-18: 50 mg via ORAL
  Filled 2013-03-16 (×4): qty 1

## 2013-03-16 MED ORDER — VITAL 1.5 CAL PO LIQD
237.0000 mL | Freq: Two times a day (BID) | ORAL | Status: DC
  Administered 2013-03-16 – 2013-03-18 (×4): 237 mL via ORAL
  Filled 2013-03-16 (×6): qty 237

## 2013-03-16 MED ORDER — TESTOSTERONE 4 MG/24HR TD PT24
1.0000 | MEDICATED_PATCH | Freq: Every day | TRANSDERMAL | Status: DC
  Administered 2013-03-17 – 2013-03-18 (×2): 1 via TRANSDERMAL

## 2013-03-16 MED ORDER — CENTRUM SILVER ADULT 50+ PO TABS: 1.0000 | ORAL_TABLET | Freq: Every evening | ORAL | Status: DC

## 2013-03-16 MED ORDER — BUDESONIDE 3 MG PO CP24
9.0000 mg | ORAL_CAPSULE | Freq: Three times a day (TID) | ORAL | Status: DC
  Administered 2013-03-16: 9 mg via ORAL
  Filled 2013-03-16 (×2): qty 3

## 2013-03-16 MED ORDER — LABETALOL HCL 200 MG PO TABS
200.0000 mg | ORAL_TABLET | Freq: Two times a day (BID) | ORAL | Status: DC
  Administered 2013-03-17 – 2013-03-18 (×3): 200 mg via ORAL
  Filled 2013-03-16 (×4): qty 1

## 2013-03-16 MED ORDER — LABETALOL HCL 200 MG PO TABS
200.0000 mg | ORAL_TABLET | Freq: Once | ORAL | Status: AC
  Administered 2013-03-16: 200 mg via ORAL
  Filled 2013-03-16: qty 1

## 2013-03-16 MED ORDER — BUDESONIDE 3 MG PO CP24
3.0000 mg | ORAL_CAPSULE | Freq: Three times a day (TID) | ORAL | Status: DC
  Administered 2013-03-17 – 2013-03-18 (×4): 3 mg via ORAL
  Filled 2013-03-16 (×10): qty 1

## 2013-03-16 MED ORDER — INSULIN ASPART 100 UNIT/ML ~~LOC~~ SOLN
0.0000 [IU] | Freq: Three times a day (TID) | SUBCUTANEOUS | Status: DC
  Administered 2013-03-16: 11 [IU] via SUBCUTANEOUS

## 2013-03-16 NOTE — Telephone Encounter (Signed)
Per staff phone call and POF I have schedueld appts.  JMW  

## 2013-03-16 NOTE — Progress Notes (Signed)
ADDENDUM TO ADMISSION NOTE:  Sclerae unicteric Oropharynx clear, slightly dry No cervical or supraclavicular adenopathy Lungs no rales or rhonchi Heart regular rate and rhythm Abd soft, NT, +BS MSK no focal spinal tenderness, 1+ bilateral ankle edema Neuro: nonfocal, well-oriented, positive affect Skin: face flushed, no rash elsewhere   CBC    Component Value Date/Time   WBC 5.9 03/16/2013 1400   WBC 6.6 03/14/2013 1008   RBC 3.82* 03/16/2013 1400   RBC 4.20 03/14/2013 1008   HGB 11.7* 03/16/2013 1400   HGB 12.8* 03/14/2013 1008   HCT 34.9* 03/16/2013 1400   HCT 38.5 03/14/2013 1008   PLT 106* 03/16/2013 1400   PLT 106* 03/14/2013 1008   MCV 91.4 03/16/2013 1400   MCV 91.7 03/14/2013 1008   MCH 30.6 03/16/2013 1400   MCH 30.5 03/14/2013 1008   MCHC 33.5 03/16/2013 1400   MCHC 33.2 03/14/2013 1008   RDW 14.9 03/16/2013 1400   RDW 14.8* 03/14/2013 1008   LYMPHSABS 0.6* 03/16/2013 1400   LYMPHSABS 1.2 03/14/2013 1008   MONOABS 0.1 03/16/2013 1400   MONOABS 0.4 03/14/2013 1008   EOSABS 0.0 03/16/2013 1400   EOSABS 0.0 03/14/2013 1008   BASOSABS 0.0 03/16/2013 1400   BASOSABS 0.0 03/14/2013 1008    BMET    Component Value Date/Time   NA 130* 03/16/2013 1400   NA 132 Repeated and Verified* 03/14/2013 1008   K 5.6* 03/16/2013 1400   K 5.6* 03/14/2013 1008   CL 107 03/16/2013 1400   CL 114 Repeated and Verified* 03/14/2013 1008   CO2 12* 03/16/2013 1400   CO2 10 Repeated and Verified* 03/14/2013 1008   GLUCOSE 397* 03/16/2013 1400   GLUCOSE 362* 03/14/2013 1008   BUN 58* 03/16/2013 1400   BUN 56.1* 03/14/2013 1008   CREATININE 1.00 03/16/2013 1400   CREATININE 1.2 03/14/2013 1008   CALCIUM 8.5 03/16/2013 1400   CALCIUM 8.3* 03/14/2013 1008   GFRNONAA 76* 03/16/2013 1400   GFRAA 89* 03/16/2013 1400   ROS: he has had 2 BMs so far today, both accidents; sugars have remained high at home; he continues to lose weight; no fever, no bleeding, no rash he is aware of. Denies unusual H/As, N/V, dizzyness, or  visual changes; no cough or phlegm; still very weak overall. Rest of ROS stable. Wife not in room

## 2013-03-16 NOTE — Care Management (Signed)
CARE MANAGEMENT NOTE 03/16/2013  Patient:  NAZIAH, WECKERLY   Account Number:  [id number]  Date Initiated:  03/16/2013  Documentation initiated by:  Lendon George  Subjective/Objective Assessment:   67 yo male admitted with C-diff and DM. PTA pt independent.ZOX:WRUE,A DOUGLAS, MD  OTHER MD: Donzetta Starch     Action/Plan:   Home when stable   Anticipated DC Date:     Anticipated DC Plan:  HOME/SELF CARE  In-house referral  NA      DC Planning Services  CM consult      Choice offered to / List presented to:             Status of service:  In process, will continue to follow Medicare Important Message given?   (If response is "NO", the following Medicare IM given date fields will be blank) Date Medicare IM given:   Date Additional Medicare IM given:    Discharge Disposition:    Per UR Regulation:  Reviewed for med. necessity/level of care/duration of stay  If discussed at Long Length of Stay Meetings, dates discussed:    Comments:  03/16/13 1400 [name] Demasi,RN,BSN [phone]

## 2013-03-16 NOTE — H&P (Signed)
Timothy [name] DOB: Apr 04, 1946 MR#: [id number]#:[id number]  NFA:OZHY,Q DOUGLAS, MD  SU:  OTHER MD: Donzetta Starch   HISTORY OF PRESENT ILLNESS:  We have very complete records from Dr. Sydnee Levans in [place], and in summary: The patient was initially diagnosed in August 2000, with a white cell count of 23,600, but normal hemoglobin and platelets, and no significant symptomatology. Over the next several years his white cell count drifted up, and he eventually developed some symptoms of night sweats in particular, leading to treatment with fludarabine, Cytoxan and rituxan for five cycles given between December 2006 and May 2007. We have CT scans from June 2006, November 2006 and April 2007, and comparing the November 2006 and April 2007 scans, there was near complete response. He had subsequent therapy in [place] as detailed below, but with decreased response, leading to allogeneic stem[facility]cell transplant at the [facility] 02/24/2012.   INTERVAL HISTORY:  Mr. Lobue has developed steroid induced diabetes. We have been unable to control this as outpatient. In addition he has c difficile diarrhea which further compromises his electrolytes. We are admitting this severely immunocompromised patient for correction of his uncontrolled diabetes, treatment of his c.difficile diarrhea, and management of his graft vs host disease.   REVIEW OF SYSTEMS:  [name] and his wife have worked hard to learn how to check and treat his diabetes. They have been faithful in working on the diarrhea issue. Hedenies any night sweats. He's noticed no skin changes. He recently had an area on his scalp biopsied which showed basal cell carcinoma with Dr. Yetta Barre.He denies any signs of abnormal bleeding. He's had no nausea or emesis. He denies any cough or phlegm production and has shortness of breath only with exertion. He's had no abnormal headaches, dizziness, or change in vision. He continues to have some lower  back pain, but has had no new or unusual myalgias, arthralgias, bony pain, or significant peripheral swelling.  Marland Kitchen  PAST MEDICAL HISTORY:  Past Medical History   Diagnosis  Date   .  Transplant recipient  07/12/1966   .  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  Diverticular disease    .  Hyperlipidemia    .  Obesity    .  Hypertension    .  Hiatal hernia    .  CMV (cytomegalovirus) antibody positive      pre-transplant, with seroconversion x2 pst-transplant   .  Right bundle branch block      pre-transplant   .  CKD (chronic kidney disease) stage 2, GFR 60-89 ml/min    .  Pancytopenia    .  Steroid-induced diabetes    .  Atrial fibrillation      post-transplant   .  Myopathy    .  Fine tremor      likely secondary to tacrolimus   .  Leukemia, chronic lymphoid    .  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  Chronic GVHD complicating bone marrow transplantation  12/05/2012   .  Diarrhea in adult patient  12/05/2012     Due to active GVHD   .  CLL (chronic lymphocytic leukemia)  12/05/2012     Dx 07/1999; started Rx 12/06 AlloBMT 3/13   .  Rash of face  12/05/2012     Due to GVHD   .  Hypomagnesemia  01/26/2013      Past Surgical History   Procedure  Laterality  Date   .  Tonsillectomy and adenoidectomy     .  Bone marrow transplant     .  Flexible sigmoidoscopy   11/17/2012     Procedure: FLEXIBLE SIGMOIDOSCOPY; Surgeon: Petra Kuba, MD; Location: [facility]; Service: Endoscopy; Laterality: N/A; Dr Ewing Schlein states will be admitted to rooom 1339 11/16/12   .  Esophagogastroduodenoscopy   11/17/2012     Procedure: ESOPHAGOGASTRODUODENOSCOPY (EGD); Surgeon: Petra Kuba, MD; Location: [facility]; Service: Endoscopy; Laterality: N/A;    FAMILY HISTORY  Family History   Problem  Relation  Age of Onset   .  Cancer  Father    The patient's father died from complications of chronic lymphocytic leukemia at the age of 67. It had been diagnosed seven years before when he was 63. The  patient is enrolled in a familial chronic lymphocytic leukemia study out of the [employer]. The patient's mother is 38, alive, unfortunately suffering with dementia, and he has a brother, 51, who is otherwise in fair health.   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 His wife of >40 years, [name], is a homemaker. Their daughter, [name], lives in [place]. She also is a Futures trader. The patient has an 38 year old grandson and an 60-year-old granddaughter, and that is really the main reason he moved to this area. He is a International aid/development worker.   ADVANCED DIRECTIVES: not in place  HEALTH MAINTENANCE:  History   Substance Use Topics   .  Smoking status:  Never Smoker   .  Smokeless tobacco:  Never Used   .  Alcohol Use:  No     Allergies   Allergen  Reactions   .  Benadryl (Diphenhydramine Hcl)      "Restless leg syndrome"    Current Outpatient Prescriptions   Medication  Sig  Dispense  Refill   .  acyclovir (ZOVIRAX) 400 MG tablet  Take 2 tablets (800 mg total) by mouth 2 (two) times daily.  60 tablet  12   .  antiseptic oral rinse (BIOTENE) LIQD  15 mLs by Mouth Rinse route 2 times daily at 12 noon and 4 pm.  237 mL  6   .  Beclomethasone Dipropionate POWD  2mg  beclomethasone in corn oil ( 2ml ) four times a day with food.  Compounded at [facility] - [phone]  1 Bottle  0   .  budesonide (ENTOCORT EC) 3 MG 24 hr capsule  Take 9 mg by mouth 3 (three) times daily.     .  Calcium Citrate (CITRACAL PO)  Take 1,200 mg by mouth 2 (two) times daily.     .  cholecalciferol (VITAMIN D) 1000 UNITS tablet  Take 1,000 Units by mouth every evening.     .  cholestyramine (QUESTRAN) 4 G packet  Take 1 packet by mouth 2 (two) times daily with a meal.  60 each  1   .  diphenoxylate-atropine (LOMOTIL) 2.5-0.025 MG per tablet  Take 1 tablet by mouth 4 (four) times daily  as needed for diarrhea or loose stools.  60 tablet  1   .  dronabinol (MARINOL) 2.5 MG capsule  Take 1 capsule (2.5 mg total) by mouth 2 (two) times daily before lunch and supper.  60 capsule  0   .  fluconazole (DIFLUCAN) 100 MG tablet  Take 1 tablet (100 mg total) by mouth every morning.  30 tablet  12   .  HYDROcodone-acetaminophen (NORCO)  10-325 MG per tablet  Take 1 tablet by mouth daily with breakfast.  30 tablet  0   .  labetalol (NORMODYNE) 200 MG tablet  Take 1 tablet (200 mg total) by mouth 2 (two) times daily.  60 tablet  12   .  Lidocaine-Hydrocortisone Ace 3-0.5 % KIT      .  lisinopril (PRINIVIL,ZESTRIL) 10 MG tablet  Take 10 mg by mouth daily.     .  metroNIDAZOLE (FLAGYL) 500 MG tablet  Take 1 tablet (500 mg total) by mouth 3 (three) times daily.  90 tablet  2   .  Multiple Vitamins-Minerals (CENTRUM SILVER ADULT 50+) TABS  Take 1 tablet by mouth every evening.     .  nateglinide (STARLIX) 120 MG tablet  Take 1 tablet (120 mg total) by mouth 3 (three) times daily before meals.  90 tablet  11   .  Nutritional Supplements (FEEDING SUPPLEMENT, VITAL 1.5 CAL,) LIQD  Take 237 mLs by mouth 2 (two) times daily.  60 Can  6   .  omeprazole (PRILOSEC) 40 MG capsule  Take 40 mg by mouth daily.     .  predniSONE (DELTASONE) 20 MG tablet  Take 50 mg by mouth daily with breakfast.     .  protein supplement (RESOURCE BENEPROTEIN) POWD  Take 6 g by mouth 3 (three) times daily with meals.     .  sertraline (ZOLOFT) 50 MG tablet  Takes 50 mg one day and 100 mg the next day  90 tablet  3   .  Sodium Chloride Flush (NORMAL SALINE FLUSH) 0.9 % SOLN      .  sulfamethoxazole-trimethoprim (BACTRIM DS) 800-160 MG per tablet  Take 1 tablet by mouth daily.  30 tablet  11   .  tacrolimus (PROGRAF) 0.5 MG capsule  Take 1.5 mg by mouth 2 (two) times daily.     Marland Kitchen  testosterone (ANDRODERM) 4 MG/24HR PT24 patch  Place 1 patch onto the skin daily.  30 patch  3   .  zinc gluconate 50 MG tablet  Take 50 mg by mouth  daily.      No current facility-administered medications for this visit.    Facility-Administered Medications Ordered in Other Visits   Medication  Dose  Route  Frequency  Provider  Last Rate  Last Dose   .  0.9 % sodium chloride infusion   Intravenous  Once  Lowella Dell, MD     .  heparin lock flush 100 unit/mL  250 Units  Intracatheter  PRN  Lowella Dell, MD     .  heparin lock flush 100 unit/mL  500 Units  Intracatheter  PRN  Lowella Dell, MD     .  sodium chloride 0.9 % injection 10 mL  10 mL  Intravenous  PRN  Lowella Dell, MD   10 mL at 08/11/12 1606   .  sodium chloride 0.9 % injection 10 mL  10 mL  Intracatheter  PRN  Lowella Dell, MD     .  sodium chloride 0.9 % injection 10 mL  10 mL  Intracatheter  PRN  Lowella Dell, MD     .  sodium chloride 0.9 % injection 10 mL  10 mL  Intracatheter  PRN  Lowella Dell, MD      OBJECTIVE:   Full physical exam today is pending  LAB RESULTS:   STUDIES   ASSESSMENT: 68 y.o. 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  His treatment history is as follows:  (1) fludarabine/cyclophosphamide/rituximab x5 completed May 2007.  (2) rituximab for 8 doses October 2010, with partial response  (3) Leustatin and ofatumumab weekly x8 July to September 2011 followed by maintenance ofatumumab maintenance ofatumumab every 2 months, with initial response but rising counts September 2012  (4) status-post unrelated donor stem-cell transplant 02/24/2012 at [facility]  (a) conditioning regimen consisted of fludarabine + TBI at 200 cGy, followed by rituximab x27;  (b) CMV reactivation x3 (patient  CMV positive, donor negative), s/p ganciclovir treatment; 3d reactivation August 2013, s/p gancyclovir, with negative PCR mid-September 2013; last gancyclovir dose 10/06/2012  (c) Chronic GVHD: involving gut and skin, treated with steroids, tacrolimus and MMF. MMF was eventually d/c'd and tacrolimus currently at a dose of 1.5mg  BID  (d) atrial fibrillation: resolved on brief amiodarone regimen  (e) steroid-induced myopathy: improving  (f) hypomagnesemia: improved after d/c gancyclovir  (g) hypogammaglobulinemia: s/p IVIG most recently 01/07/2013.  (h) history of elevated triglycerides (606 on 07/14/2012)  (i) adrenal insufficiency: on prednisone and budesonide  (j) pancytopenia, improved  (5) restaging studies September 2013 including CT scans, flow cytometry, and bone marrow biopsy, showed no evidence of residual chronic lymphoid leukemia.  (6) recurrent GVHD (skin rash, mouth changes, severe diarrhea and gastric/duodenal/colonic biopsies 11/17/2012 c/w GVHD grade 2) now only remaining sign is bothersome diarrhea; c diff negative x3; continuing current regimen  (7) severe malnutrition -- on VITAL supplement in addition to regular diet; on Marinol for anorexia  (8) testosterone deficiency--on patch  (9) deconditioning: ongoing REHAB  (10) mild dehydration: encouraged increased po fluids; receives IVF support w magnesium every Mon, Th and Sat  (11) steroid-induced osteoporosis with compression fractures: received pamidronate 12/18/2012  (12) nausea: improved on current meds  (13) Positive c.diff, 03/08/2013, on Flagyl 500 mg TID x 10 days   PLAN:  The plan is to admit the patient for correction of his DM, management of his c difficile, and adjustment of his graft vs host meds. He may need endoscopic biopsy for full evaluation. Full code.

## 2013-03-17 ENCOUNTER — Encounter: Payer: Self-pay | Admitting: Certified Registered Nurse Anesthetist

## 2013-03-17 ENCOUNTER — Ambulatory Visit: Payer: Self-pay

## 2013-03-17 ENCOUNTER — Other Ambulatory Visit: Payer: Self-pay | Admitting: Lab

## 2013-03-17 ENCOUNTER — Ambulatory Visit: Payer: Self-pay | Admitting: Oncology

## 2013-03-17 LAB — GLUCOSE, CAPILLARY
Glucose-Capillary: 308 mg/dL — ABNORMAL HIGH (ref 70–99)
Glucose-Capillary: 260 mg/dL — ABNORMAL HIGH (ref 70–99)

## 2013-03-17 LAB — HEMOGLOBIN A1C: Hgb A1c MFr Bld: 7 % — ABNORMAL HIGH (ref <5.7)

## 2013-03-17 MED ORDER — INSULIN ASPART PROT & ASPART (70-30 MIX) 100 UNIT/ML ~~LOC~~ SUSP
6.0000 [IU] | Freq: Every day | SUBCUTANEOUS | Status: DC
  Administered 2013-03-17: 6 [IU] via SUBCUTANEOUS
  Filled 2013-03-17: qty 10

## 2013-03-17 MED ORDER — INSULIN ASPART PROT & ASPART (70-30 MIX) 100 UNIT/ML ~~LOC~~ SUSP
10.0000 [IU] | Freq: Every day | SUBCUTANEOUS | Status: DC
  Administered 2013-03-17: 10 [IU] via SUBCUTANEOUS
  Filled 2013-03-17 (×2): qty 10

## 2013-03-17 NOTE — Evaluation (Signed)
Physical Therapy Evaluation Patient Details Name: Timothy [name] MRN: [id number] DOB: 1946/03/17 Today's Date: 03/17/2013 Time: 7846-9629 PT Time Calculation (min): 22 min  PT Assessment / Plan / Recommendation Clinical Impression  Pt. is 67 y/o male admitted 03/16/13 with uncontrolled steroid induced diabetes and c diff.. Pt. has a h/o lymphoma. Pt. uses 2 poles for ambulation PTA when outside. Pt. will benefit from PT while in acute care to improve endurance and balance responses. Pt. states He does not feel need for HHPT at this time.    PT Assessment  Patient needs continued PT services    Follow Up Recommendations  No PT follow up               Equipment Recommendations  None recommended by PT         Frequency Min 3X/week    Precautions / Restrictions Precautions Precautions: Fall Restrictions Weight Bearing Restrictions: No          Mobility  Bed Mobility Bed Mobility: Supine to Sit Rolling Right: 7: Independent Right Sidelying to Sit: 6: Modified independent (Device/Increase time) Supine to Sit: 6: Modified independent (Device/Increase time);HOB flat;With rails Details for Bed Mobility Assistance: pt did use rail to sit up, extra time to get into sitting. pt able to get legs onto bed but appeared with some effort. Transfers Transfers: Sit to Stand;Stand to Sit Sit to Stand: With upper extremity assist;6: Modified independent (Device/Increase time);From bed;From elevated surface Stand to Sit: 6: Modified independent (Device/Increase time);To elevated surface;To bed Ambulation/Gait Ambulation/Gait Assistance: 4: Min guard Ambulation Distance (Feet): 300 Feet Assistive device: Other (Comment) (Pt. used 2 wallking poles w/ rubber tips.) Ambulation/Gait Assistance Details: occassional mild balance loss, tended to veer to R. Gait Pattern: Step-through pattern;Trunk flexed;Decreased stride length General Gait Details:  a 4 pt gait    Exercises     PT Diagnosis:  Generalized weakness  PT Problem List: Decreased activity tolerance;Decreased balance;Decreased mobility;Decreased knowledge of use of DME;Decreased knowledge of precautions PT Treatment Interventions: Gait training;Functional mobility training;Therapeutic activities;Therapeutic exercise;Balance training;Patient/family education   PT Goals Acute Rehab PT Goals PT Goal Formulation: With patient Time For Goal Achievement: 03/31/13 Potential to Achieve Goals: Good Pt will Ambulate: >150 feet;with modified independence;with least restrictive assistive device PT Goal: Ambulate - Progress: Goal set today  Visit Information  Last PT Received On: 03/17/13 Assistance Needed: +1    Subjective Data  Subjective: i HAVE BEEN GETTING UP WITH THE iv POLE. Patient Stated Goal: to go home.   Prior Functioning  Home Living Lives With: Spouse Available Help at Discharge: Available 24 hours/day Type of Home: House Home Access: Stairs to enter [employer] of Steps: 3 Entrance Stairs-Rails: Can reach both Home Layout: Two level;1/2 bath on main level;Able to live on main level with bedroom/bathroom Alternate Level Stairs-Number of Steps: 13 Alternate Level Stairs-Rails: Left;Right Bathroom Shower/Tub: Health visitor: Standard Home Adaptive Equipment: Bedside commode/3-in-1;Shower chair with back;Walker - four wheeled;Wheelchair - manual Additional Comments: walking sticks uses them only when out but not in the house Prior Function Level of Independence: Independent Able to Take Stairs?: No Driving: No Vocation: Retired Musician: No difficulties Dominant Hand: Right    Cognition  Cognition Overall Cognitive Status: Appears within functional limits for tasks assessed/performed Arousal/Alertness: Awake/alert Orientation Level: Appears intact for tasks assessed Behavior During Session: [facility] for tasks performed    Extremity/Trunk Assessment Right  Upper Extremity Assessment RUE ROM/Strength/Tone: Within functional levels RUE Sensation: WFL - Light Touch RUE Coordination: WFL -  gross/fine motor (slight tremor noted secondary to steroids per pt) Left Upper Extremity Assessment LUE ROM/Strength/Tone: Within functional levels LUE Sensation: WFL - Light Touch LUE Coordination: WFL - gross/fine motor (slight tremor noted secondary to steroids per pt) Right Lower Extremity Assessment RLE ROM/Strength/Tone: [facility] for tasks assessed Left Lower Extremity Assessment LLE ROM/Strength/Tone: [facility] for tasks assessed Trunk Assessment Trunk Assessment: Normal   Balance Balance Balance Assessed: Yes Static Standing Balance Static Standing - Balance Support: No upper extremity supported Static Standing - Level of Assistance: 5: Stand by assistance Static Standing - Comment/# of Minutes: noted trunk to sway without UE support. Dynamic Standing Balance Dynamic Standing - Balance Support: Right upper extremity supported;Left upper extremity supported Dynamic Standing - Level of Assistance: 6: Modified independent (Device/Increase time)  End of Session PT - End of Session Activity Tolerance: Patient tolerated treatment well Patient left: in bed;with call bell/phone within reach  GP     Rada Hay 03/17/2013, 10:44 AM  Blanchard Kelch PT [phone]

## 2013-03-17 NOTE — Progress Notes (Signed)
Inpatient Diabetes Program Recommendations  AACE/ADA: New Consensus Statement on Inpatient Glycemic Control (2013)  Target Ranges:  Prepandial:   less than 140 mg/dL      Peak postprandial:   less than 180 mg/dL (1-2 hours)      Critically ill patients:  140 - 180 mg/dL   Reason for Visit: Note history.  Patient's A1C =7.0% indicating fairly good control in the past 2-3 months.  He states CBG's have been increased with steroids.  Patient started on Novolog 70/30 regimen.  Taught patient and wife how to use insulin pen which will be more convenient.  They were able to demonstrate and teach back use.  Patient will need Rx. For Novolog 70/30 Flexpen and insulin pen needles 5 mm/31 gauge.  Discussed hypoglycemia treatment and normal CBG's.  Patient was able to verbalize proper treatment of hypoglycemia.  Requested consult with dietician.  He is drinking Vital supplements twice a day and protein shakes.  He has meter but cannot find strips.  Left meter rx. In shadow chart for MD to sign.  Recommend that they take to pharmacy to find out which meter/strips is covered best by his insurance.  Will follow-up with patient on 03/18/13.

## 2013-03-17 NOTE — Progress Notes (Signed)
Late entry for 03/05/2013 No end time noted to Magnesium on 03/05/2013.  Start time at 0934hr, pt. Was flushed and discharged at 1041hr.  Magnesiusm infused over 1hr. HL

## 2013-03-17 NOTE — Evaluation (Signed)
Occupational Therapy Evaluation Patient Details Name: Timothy [name] MRN: [id number] DOB: 1946-07-14 Today's Date: 03/17/2013 Time: 3086-5784 OT Time Calculation (min): 22 min  OT Assessment / Plan / Recommendation Clinical Impression  Pt is a 67 yr old male with a history of well-differentiated lymphocytic lymphoma/ chronic lymphoid leukemia initially diagnosed in 2000.  Now admitted with steroid induced DM and c-diff.  Pt overall presents at a supervision/ modified independent level with simulated selfcare tasks and functional transfers.  Feel pt is at his functional baseline for selfcare tasks at this time and does not warrant any further OT needs.  Pt is in agreement with this and understands use OF DME (3:1 and shower seat)  if he  chooses at home.  Wife can provide 24 hour supervision as well.    OT Assessment  Patient needs continued OT Services;Patient does not need any further OT services    Follow Up Recommendations  No OT follow up       Equipment Recommendations  None recommended by OT          Precautions / Restrictions Precautions Precautions: Fall Restrictions Weight Bearing Restrictions: No   Pertinent Vitals/Pain No report of pain and vitals stable    ADL  Eating/Feeding: Simulated;Independent Where Assessed - Eating/Feeding: Edge of bed Grooming: Simulated;Modified independent Where Assessed - Grooming: Unsupported standing Upper Body Bathing: Simulated;Supervision/safety Where Assessed - Upper Body Bathing: Supported sitting Lower Body Bathing: Simulated;Set up Where Assessed - Lower Body Bathing: Supported standing Upper Body Dressing: Simulated;Set up Where Assessed - Upper Body Dressing: Unsupported sitting Lower Body Dressing: Performed;Modified independent Where Assessed - Lower Body Dressing: Unsupported sit to stand Toilet Transfer: Simulated;Modified independent Toilet Transfer Method: Other (comment) (ambulate with IV pole for slight  support) Toilet Transfer Equipment: Regular height toilet;Grab bars Toileting - Clothing Manipulation and Hygiene: Simulated;Modified independent Where Assessed - Toileting Clothing Manipulation and Hygiene: Sit to stand from 3-in-1 or toilet Tub/Shower Transfer: Performed;Modified independent Tub/Shower Transfer Method: Science writer: Walk in Scientist, research (physical sciences) Used: Other (comment) (walking sticks) Transfers/Ambulation Related to ADLs: Pt overall supervision level for mobility using his walking sticks for support. ADL Comments: Pt has 3:1 at home but was not using it prior to admission.  He reports having vanity beside of the sink to help him stand.  He is also aware of shower seats as well but pefers to balance himself using the walls instead.  Feel he is at functional baseline for OT at this time and he also agrees.  No further OT needs.      Visit Information  Last OT Received On: 03/17/13 Assistance Needed: +1    Subjective Data  Subjective: I just need to stay active and I will be OK. Patient Stated Goal: Pt wants to keep moving and keep his mobility.   Prior Functioning     Home Living Lives With: Spouse Available Help at Discharge: Available 24 hours/day Type of Home: House Home Access: Stairs to enter [employer] of Steps: 3 Entrance Stairs-Rails: Can reach both Home Layout: Two level;1/2 bath on main level;Able to live on main level with bedroom/bathroom Alternate Level Stairs-Number of Steps: 13 Alternate Level Stairs-Rails: Left;Right Bathroom Shower/Tub: Health visitor: Standard Home Adaptive Equipment: Bedside commode/3-in-1;Shower chair with back;Walker - four wheeled;Wheelchair - manual Additional Comments: walking sticks uses them only when out but not in the house Prior Function Level of Independence: Independent Able to Take Stairs?: No Driving: No Vocation: Retired Musician: No  difficulties Dominant Hand:  Right         Vision/Perception Vision - History Baseline Vision: Wears glasses only for reading Patient Visual Report: No change from baseline Vision - Assessment Eye Alignment: Within Functional Limits Vision Assessment: Vision not tested Perception Perception: Within Functional Limits Praxis Praxis: Intact   Cognition  Cognition Overall Cognitive Status: Appears within functional limits for tasks assessed/performed Arousal/Alertness: Awake/alert Orientation Level: Appears intact for tasks assessed Behavior During Session: [facility] for tasks performed    Extremity/Trunk Assessment Right Upper Extremity Assessment RUE ROM/Strength/Tone: Within functional levels RUE Sensation: WFL - Light Touch RUE Coordination: WFL - gross/fine motor (slight tremor noted secondary to steroids per pt) Left Upper Extremity Assessment LUE ROM/Strength/Tone: Within functional levels LUE Sensation: WFL - Light Touch LUE Coordination: WFL - gross/fine motor (slight tremor noted secondary to steroids per pt) Right Lower Extremity Assessment RLE ROM/Strength/Tone: [facility] for tasks assessed Left Lower Extremity Assessment LLE ROM/Strength/Tone: [facility] for tasks assessed Trunk Assessment Trunk Assessment: Normal     Mobility Bed Mobility Bed Mobility: Supine to Sit Rolling Right: 7: Independent Right Sidelying to Sit: 6: Modified independent (Device/Increase time) Supine to Sit: 6: Modified independent (Device/Increase time);HOB flat;With rails Details for Bed Mobility Assistance: pt did use rail to sit up, extra time to get into sitting. pt able to get legs onto bed but appeared with some effort. Transfers Transfers: Sit to Stand;Stand to Sit Sit to Stand: With upper extremity assist;6: Modified independent (Device/Increase time);From bed;From elevated surface Stand to Sit: 6: Modified independent (Device/Increase time);To elevated surface;To bed     Exercise      Balance Balance Balance Assessed: Yes Static Standing Balance Static Standing - Balance Support: No upper extremity supported Static Standing - Level of Assistance: 5: Stand by assistance Static Standing - Comment/# of Minutes: noted trunk to sway without UE support. Dynamic Standing Balance Dynamic Standing - Balance Support: Right upper extremity supported;Left upper extremity supported Dynamic Standing - Level of Assistance: 6: Modified independent (Device/Increase time)   End of Session OT - End of Session Activity Tolerance: Patient tolerated treatment well Patient left: in bed;with call bell/phone within reach Nurse Communication: Mobility status     Raydan Schlabach OTR/L Pager number [phone] 03/17/2013, 10:43 AM

## 2013-03-17 NOTE — Progress Notes (Signed)
Timothy [name]   DOB:Nov 28, 1946   ZO#:[id number]   JWJ#:[id number]  Subjective: Arye looks better today; had 3 BMs yesterday, on this AM at about 2:30 AM, was able to get to the toilet even after having to unplug the IVF, etc. No mouth soreness or discomfort, no pain on swallowing, no fever he is aware of. Spoke with wife [name] over phone.   Objective: middle aged white man examined sitting up at bedside Filed Vitals:   03/17/13 0452  BP: 150/82  Pulse: 79  Temp: 98.1 F (36.7 C)  Resp: 18    Body mass index is 24.21 kg/(m^2). No intake or output data in the 24 hours ending 03/17/13 0838   Sclerae unicteric  Oropharynx clear  No facial flushing today, no rash  No peripheral adenopathy  Lungs clear -- no rales or rhonchi  Heart regular rate and rhythm  Abdomen benign  MSK no focal spinal tenderness, minimal bilateral ankle edema  Neuro nonfocal  Skin: improved turgor, no rash  CBG (last 3)   Recent Labs  03/16/13 1654 03/16/13 2140 03/17/13 0806  GLUCAP 351* 339* 249*     Labs:  Lab Results  Component Value Date   WBC 5.9 03/16/2013   HGB 11.7* 03/16/2013   HCT 34.9* 03/16/2013   MCV 91.4 03/16/2013   PLT 106* 03/16/2013   NEUTROABS 5.3 03/16/2013    @LASTCHEMISTRY @  Urine Studies No results found for this basename: UACOL, UAPR, USPG, UPH, UTP, UGL, UKET, UBIL, UHGB, UNIT, UROB, ULEU, UEPI, UWBC, URBC, UBAC, CAST, CRYS, UCOM, BILUA,  in the last 72 hours  Basic Metabolic Panel:  Recent Labs Lab 03/14/13 1008 03/16/13 1400  NA 132 Repeated and Verified* 130*  K 5.6* 5.6*  CL 114 Repeated and Verified* 107  CO2 10 Repeated and Verified* 12*  GLUCOSE 362* 397*  BUN 56.1* 58*  CREATININE 1.2 1.00  CALCIUM 8.3* 8.5  MG 2.2 1.9  PHOS  --  2.8   GFR Estimated Creatinine Clearance: 67.9 ml/min (by C-G formula based on Cr of 1). Liver Function Tests:  Recent Labs Lab 03/14/13 1008 03/16/13 1400  AST 15 14  ALT 39 33  ALKPHOS 102 90  BILITOT 0.22 0.2*   PROT 4.8* 4.4*  ALBUMIN 2.6* 2.5*   No results found for this basename: LIPASE, AMYLASE,  in the last 168 hours No results found for this basename: AMMONIA,  in the last 168 hours Coagulation profile  Recent Labs Lab 03/16/13 1400  INR 1.10    CBC:  Recent Labs Lab 03/14/13 1008 03/16/13 1400  WBC 6.6 5.9  NEUTROABS 5.0 5.3  HGB 12.8* 11.7*  HCT 38.5 34.9*  MCV 91.7 91.4  PLT 106* 106*   Cardiac Enzymes: No results found for this basename: CKTOTAL, CKMB, CKMBINDEX, TROPONINI,  in the last 168 hours BNP: No components found with this basename: POCBNP,  CBG:  Recent Labs Lab 03/16/13 1303 03/16/13 1654 03/16/13 2140 03/17/13 0806  GLUCAP 326* 351* 339* 249*   D-Dimer No results found for this basename: DDIMER,  in the last 72 hours Hgb A1c  Recent Labs  03/16/13 1400  HGBA1C 7.0*   Lipid Profile No results found for this basename: CHOL, HDL, LDLCALC, TRIG, CHOLHDL, LDLDIRECT,  in the last 72 hours Thyroid function studies  Recent Labs  03/16/13 1400  TSH 0.535   Anemia work up  Recent Labs  03/16/13 1400  RETICCTPCT 0.8   Microbiology Recent Results (from the past 240 hour(s))  CLOSTRIDIUM DIFFICILE  BY PCR     Status: Abnormal   Collection Time    03/08/13  3:26 PM      Result Value Range Status   C difficile by pcr Positive (*) Negative Final   Comment:  This assay detects the presence of Clostridium difficile DNA codingfor toxin B (tcdB) by real-time polymerase chain reaction (PCR)amplification.This test was developed and its performance characteristics have beendetermined by [employer].      Performance characteristics referto the analytical performance of the test. This test has not beencleared or approved by the [name] Food and Drug Administration. The FDAhas determined that such clearance or approval is not necessary. Thislaboratory is      certified under the Clinical Laboratory ImprovementAmendments of 1988 as qualified to  perform high complexity clinicallaboratory testing.  TECHNOLOGIST REVIEW     Status: None   Collection Time    03/14/13 10:08 AM      Result Value Range Status   Technologist Review few helmets   Final      Studies:  No results found.  Assessment: 67 y.o. [place] man with a history of well-differentiated lymphocytic lymphoma/ chronic lymphoid leukemia initially diagnosed in 2000, not requiring intervention until 2006; with multiple chromosomal abnormalities; now with uncontrolled diabetes due to steroids (and possibly pancreatic endocrine failure), as well as c difficile colitis, with poorly controlled graft vs host disease.  His treatment history is as follows:  (1) fludarabine/cyclophosphamide/rituximab x5 completed May 2007.  (2) rituximab for 8 doses October 2010, with partial response  (3) Leustatin and ofatumumab weekly x8 July to September 2011 followed by maintenance ofatumumab maintenance ofatumumab every 2 months, with initial response but rising counts September 2012  (4) status-post unrelated donor stem-cell transplant 02/24/2012 at [facility]  (a) conditioning regimen consisted of fludarabine + TBI at 200 cGy, followed by rituximab x27;  (b) CMV reactivation x3 (patient CMV positive, donor negative), s/p ganciclovir treatment; 3d reactivation August 2013, s/p gancyclovir, with negative PCR mid-September 2013; last gancyclovir dose 10/06/2012  (c) Chronic GVHD: involving gut and skin, treated with steroids, tacrolimus and MMF. MMF was eventually d/c'd and tacrolimus currently at a dose of 1.5mg  BID  (d) atrial fibrillation: resolved on brief amiodarone regimen  (e) steroid-induced myopathy: improving  (f) hypomagnesemia: improved after d/c gancyclovir  (g) hypogammaglobulinemia: s/p IVIG most recently 01/07/2013.  (h) history of elevated triglycerides (606 on 07/14/2012)  (i) adrenal insufficiency: on prednisone and budesonide  (j) pancytopenia, improved  (5) restaging  studies September 2013 including CT scans, flow cytometry, and bone marrow biopsy, showed no evidence of residual chronic lymphoid leukemia.  (6) recurrent GVHD (skin rash, mouth changes, severe diarrhea and gastric/duodenal/colonic biopsies 11/17/2012 c/w GVHD grade 2) now only remaining sign is bothersome diarrhea; c diff negative x3; continuing current regimen  (7) severe malnutrition -- on VITAL supplement in addition to regular diet; on Marinol for anorexia  (8) testosterone deficiency--on patch  (9) deconditioning: ongoing REHAB  (10) mild dehydration: encouraged increased po fluids; receives IVF support w magnesium every Mon, Th and Sat  (11) steroid-induced osteoporosis with compression fractures: received pamidronate 12/18/2012  (12) nausea: improved on current meds  (13) Positive c.diff, 03/08/2013, on Flagyl 500 mg TID x 10 days (14) steroid-induced diabetes  PLAN:  We are going to continue the Starlix/nateglitidine and add 70/30 w breakfast and supper. Will get DM consult as patient needs a different glucometer (can't find strips for the one they got in [place]). RN is continuing monitoring and teaching.  As fas as C Diff, there has been some slowdown of the diarrhea, but we will continue the metronidazole beyond the initial 10 days. Anticipate d/c in AM.    Clemons Salvucci C 03/17/2013

## 2013-03-17 NOTE — Progress Notes (Signed)
Late entry for DOS 03/03/13.  Magnesium infusion start time 1035h, no end time noted in [facility].  Pt. Discharged at 1144hr.  Infusion should be charged for 1hr. HL

## 2013-03-18 ENCOUNTER — Telehealth: Payer: Self-pay | Admitting: *Deleted

## 2013-03-18 ENCOUNTER — Other Ambulatory Visit: Payer: Self-pay | Admitting: Oncology

## 2013-03-18 ENCOUNTER — Telehealth: Payer: Self-pay | Admitting: Oncology

## 2013-03-18 LAB — CBC WITH DIFFERENTIAL/PLATELET
Basophils Relative: 0 % (ref 0–1)
Eosinophils Absolute: 0 10*3/uL (ref 0.0–0.7)
HCT: 28.8 % — ABNORMAL LOW (ref 39.0–52.0)
Hemoglobin: 9.9 g/dL — ABNORMAL LOW (ref 13.0–17.0)
Lymphs Abs: 1.4 10*3/uL (ref 0.7–4.0)
MCH: 31.2 pg (ref 26.0–34.0)
MCHC: 34.4 g/dL (ref 30.0–36.0)
MCV: 90.9 fL (ref 78.0–100.0)
Monocytes Absolute: 0.3 10*3/uL (ref 0.1–1.0)
Monocytes Relative: 5 % (ref 3–12)
Neutrophils Relative %: 69 % (ref 43–77)
RBC: 3.17 MIL/uL — ABNORMAL LOW (ref 4.22–5.81)

## 2013-03-18 LAB — MAGNESIUM: Magnesium: 1.9 mg/dL (ref 1.5–2.5)

## 2013-03-18 LAB — COMPREHENSIVE METABOLIC PANEL
Alkaline Phosphatase: 73 U/L (ref 39–117)
BUN: 44 mg/dL — ABNORMAL HIGH (ref 6–23)
Creatinine, Ser: 0.91 mg/dL (ref 0.50–1.35)
GFR calc Af Amer: >90 mL/min (ref >90)
Glucose, Bld: 241 mg/dL — ABNORMAL HIGH (ref 70–99)
Potassium: 5.2 mEq/L — ABNORMAL HIGH (ref 3.5–5.1)
Total Bilirubin: 0.2 mg/dL — ABNORMAL LOW (ref 0.3–1.2)
Total Protein: 4 g/dL — ABNORMAL LOW (ref 6.0–8.3)

## 2013-03-18 MED ORDER — INSULIN ASPART PROT & ASPART (70-30 MIX) 100 UNIT/ML ~~LOC~~ SUSP: 7.0000 [IU] | Freq: Every day | SUBCUTANEOUS | Status: DC

## 2013-03-18 MED ORDER — HYDROCODONE-ACETAMINOPHEN 10-325 MG PO TABS: 0.5000 | ORAL_TABLET | Freq: Every day | ORAL | Status: DC | PRN

## 2013-03-18 MED ORDER — INSULIN ASPART PROT & ASPART (70-30 MIX) 100 UNIT/ML ~~LOC~~ SUSP: 14.0000 [IU] | Freq: Every day | SUBCUTANEOUS | Status: DC

## 2013-03-18 MED ORDER — VITAL 1.5 CAL PO LIQD: 237.0000 mL | Freq: Two times a day (BID) | ORAL | Status: DC

## 2013-03-18 NOTE — Progress Notes (Signed)
Pt asessment unchanged from this AM; pt medically stable for DC per MD orders.  Reviewed DC instructions and meds with pt and pt wife.

## 2013-03-18 NOTE — Telephone Encounter (Signed)
Wife stopped by for schedule and was given new schedule for March and April. appts added for next wk per 3/28 pof.

## 2013-03-18 NOTE — Progress Notes (Signed)
Inpatient Diabetes Program Recommendations  AACE/ADA: New Consensus Statement on Inpatient Glycemic Control (2013)  Target Ranges:  Prepandial:   less than 140 mg/dL      Peak postprandial:   less than 180 mg/dL (1-2 hours)      Critically ill patients:  140 - 180 mg/dL   Reason for Visit: Note patient to be discharged today.  Called Dr. Darnelle Catalan to ensure the Rx. Is for Novolog 70/30 flexpen.  His RN Deanna Artis states that she will call [facility] outpatient pharmacy to switch Rx. To flexpen and for insulin pen needles.  Spoke to patient and he states he feels comfortable with discharge plan.  Gave him Flexpen handout with oral instructions on how to use pen and with coupon for insulin pen.  Re-emphasized importance of taking insulin with meals and checking CBG's.  He is interested in meeting with diabetes educator after discharge.  Will place referral per protocol.  MD left Rx. For new glucose meter also.

## 2013-03-18 NOTE — Care Management Note (Signed)
    Page 1 of 1   03/18/2013     1:24:07 PM   CARE MANAGEMENT NOTE 03/18/2013  Patient:  QUATAVIOUS, ROSSA   Account Number:  [id number]  Date Initiated:  03/16/2013  Documentation initiated by:  DAVIS,TYMEEKA  Subjective/Objective Assessment:   68 yo male admitted with C-diff and DM. PTA pt independent.AOZ:HYQM,V Riley Lam, MD  OTHER MD: Donzetta Starch     Action/Plan:   Home when stable   Anticipated DC Date:  03/18/2013   Anticipated DC Plan:  HOME/SELF CARE  In-house referral  NA      DC Planning Services  CM consult      Choice offered to / List presented to:             Status of service:  Completed, signed off Medicare Important Message given?   (If response is "NO", the following Medicare IM given date fields will be blank) Date Medicare IM given:   Date Additional Medicare IM given:    Discharge Disposition:  HOME/SELF CARE  Per UR Regulation:  Reviewed for med. necessity/level of care/duration of stay  If discussed at Long Length of Stay Meetings, dates discussed:    Comments:  03/18/13 Rachna Schonberger RN,BSN NCM [phone] NO PT/OT.NO D/C NEEDS OR ORDERS.  03/16/13 1400 Tymeeka Davis,RN,BSN [phone]

## 2013-03-19 ENCOUNTER — Ambulatory Visit: Payer: Self-pay

## 2013-03-21 ENCOUNTER — Other Ambulatory Visit: Payer: Self-pay | Admitting: Oncology

## 2013-03-21 ENCOUNTER — Ambulatory Visit (HOSPITAL_BASED_OUTPATIENT_CLINIC_OR_DEPARTMENT_OTHER): Payer: BC Managed Care – PPO

## 2013-03-21 ENCOUNTER — Telehealth: Payer: Self-pay | Admitting: *Deleted

## 2013-03-21 ENCOUNTER — Other Ambulatory Visit (HOSPITAL_BASED_OUTPATIENT_CLINIC_OR_DEPARTMENT_OTHER): Payer: BC Managed Care – PPO

## 2013-03-21 DIAGNOSIS — R894 Abnormal immunological findings in specimens from other organs, systems and tissues: Secondary | ICD-10-CM

## 2013-03-21 DIAGNOSIS — D61818 Other pancytopenia: Secondary | ICD-10-CM

## 2013-03-21 DIAGNOSIS — D89811 Chronic graft-versus-host disease: Secondary | ICD-10-CM

## 2013-03-21 DIAGNOSIS — C9110 Chronic lymphocytic leukemia of B-cell type not having achieved remission: Secondary | ICD-10-CM

## 2013-03-21 DIAGNOSIS — I4891 Unspecified atrial fibrillation: Secondary | ICD-10-CM

## 2013-03-21 DIAGNOSIS — R21 Rash and other nonspecific skin eruption: Secondary | ICD-10-CM

## 2013-03-21 DIAGNOSIS — K449 Diaphragmatic hernia without obstruction or gangrene: Secondary | ICD-10-CM

## 2013-03-21 DIAGNOSIS — R768 Other specified abnormal immunological findings in serum: Secondary | ICD-10-CM

## 2013-03-21 DIAGNOSIS — R112 Nausea with vomiting, unspecified: Secondary | ICD-10-CM

## 2013-03-21 LAB — CBC WITH DIFFERENTIAL/PLATELET
BASO%: 0.1 % (ref 0.0–2.0)
Basophils Absolute: 0 10*3/uL (ref 0.0–0.1)
EOS%: 0 % (ref 0.0–7.0)
HCT: 33.6 % — ABNORMAL LOW (ref 38.4–49.9)
HGB: 11.2 g/dL — ABNORMAL LOW (ref 13.0–17.1)
LYMPH%: 11.1 % — ABNORMAL LOW (ref 14.0–49.0)
MCH: 30.7 pg (ref 27.2–33.4)
MCHC: 33.2 g/dL (ref 32.0–36.0)
MONO#: 0.3 10*3/uL (ref 0.1–0.9)
NEUT%: 85.5 % — ABNORMAL HIGH (ref 39.0–75.0)
Platelets: 126 10*3/uL — ABNORMAL LOW (ref 140–400)

## 2013-03-21 LAB — COMPREHENSIVE METABOLIC PANEL (CC13)
Albumin: 2.5 g/dL — ABNORMAL LOW (ref 3.5–5.0)
Alkaline Phosphatase: 82 U/L (ref 40–150)
BUN: 49.3 mg/dL — ABNORMAL HIGH (ref 7.0–26.0)
CO2: 13 mEq/L — ABNORMAL LOW (ref 22–29)
Glucose: 253 mg/dl — ABNORMAL HIGH (ref 70–99)
Potassium: 5.2 mEq/L — ABNORMAL HIGH (ref 3.5–5.1)
Total Protein: 4.5 g/dL — ABNORMAL LOW (ref 6.4–8.3)

## 2013-03-21 LAB — MAGNESIUM (CC13): Magnesium: 1.9 mg/dl (ref 1.5–2.5)

## 2013-03-21 LAB — LACTATE DEHYDROGENASE (CC13): LDH: 175 U/L (ref 125–245)

## 2013-03-21 MED ORDER — SODIUM CHLORIDE 0.9 % IV SOLN
Freq: Once | INTRAVENOUS | Status: AC
  Administered 2013-03-21: 14:00:00 via INTRAVENOUS
  Filled 2013-03-21: qty 500

## 2013-03-21 NOTE — Progress Notes (Signed)
Pt's BP 163/96, informed Dr Darnelle Catalan, no further orders will reassess on 03/23/13 with Amy [name]. SLJ

## 2013-03-21 NOTE — Patient Instructions (Signed)
Hypomagnesemia Magnesium is a common ion (mineral) in the body which is needed for metabolism. It is about how the body handles food and other chemical reactions necessary for life. Only about 2% of the magnesium in our body is found in the blood. When this is low, it is called hypomagnesemia. The blood will measure only a tiny amount of the magnesium in our body. When it is low in our blood, it does not mean that the whole body supply is low. The normal serum concentration ranges from 1.8-2.5 mEq/L. When the level gets to be less than 1.0 mEq/L, a number of problems begin to happen.  CAUSES   Receiving intravenous fluids without magnesium replacement.  Loss of magnesium from the bowel by naso-gastric suction.  Loss of magnesium from nausea and vomiting or severe diarrhea. Any of the inflammatory bowel conditions can cause this.  Abuse of alcohol often leads to low serum magnesium.  An inherited form of magnesium loss happens when the kidneys lose magnesium. This is called familial or primary hypomagnesemia.  Some medications such as diuretics also cause the loss of magnesium. SYMPTOMS  These following problems are worse if the changes in magnesium levels come on suddenly.  Tremor.  Confusion.  Muscle weakness.  Over-sensitive to sights and sounds.  Sensitive reflexes.  Depression.  Muscular fibrillations.  Over-reactivity of the nerves.  Irritability.  Psychosis.  Spasms of the hand muscles.  Tetany (where the muscles go into uncontrollable spasms). DIAGNOSIS  This condition can be diagnosed by blood tests. TREATMENT   In emergency, magnesium can be given intravenously (by vein).  If the condition is less worrisome, it can be corrected by diet. High levels of magnesium are found in green leafy vegetables, peas, beans and nuts among other things. It can also be given through medications by mouth.  If it is being caused by medications, changes can be made.  If  alcohol is a problem, help is available if there are difficulties giving it up. Document Released: 09/03/2005 Document Revised: 03/01/2012 Document Reviewed: 07/28/2008 [employer] Patient Information 2013 [employer].  

## 2013-03-21 NOTE — Telephone Encounter (Signed)
Patient here for IVF.  This Saturday's appointment was cancelled and he is to come in Mon/Wed/Fri of next week for IVF.  Patient and wife concerned about blood sugar.  Report cbg = 338 about 4:00 pm on 03-18-2013.  CBG = 216 at 6:30 pm and = 162 at 8:00 am this morning.  His wife expressed concern over this "big drop".  Also asked if the dosing is correct for 14 units novulog 70/30 with breakfast and 7 units with supper is correct and how to use the glucose tablets that came with the meter.  She has an insulin pen and administered 7 units at 6:30 pm and the 14 units this am.   Verified with [facility] pharmacist the onset and peak of this insulin and Pharmacist suggested I check patient's blood sugar now.  Patient has eaten breakfast and takes prednisone 20mg  with breakfast.  Unable to check blood sugar in treatment room on the weekend (lab closed).  Wife has a new glucometer with her and plans to carry this with them at all times. Discussed with them the NPH (long acting 12 hr.) and the Novolog (fast acting 30 min) insulins in the combination insulin he receives.  Also discussed eating proper foods and to tape the diet instructions to the refrigerator.  Emphasized a bedtime snack and to drink milk for a steady glucose level through the night.  Reports having a good appetite.  Advised not to worry as the normal glucose level = 60 to 100 and as long as he is eating and taking the steroids he'll be fine.  Reviewed normal glucose levels and to only use glucose tablets if he is less than 60 with hypoglycemic symptoms.  Wife was able to tell me what the hospital diabetic facilitator taught her about symptoms of confusion drowsiness and sweating if blood sugar gets too low.  Says "this has been a crash course and has a lot of handouts to read over."  New appointment calendar given.  Both thanked me for helping them and he wasn't supposed to be here today.

## 2013-03-23 ENCOUNTER — Telehealth: Payer: Self-pay | Admitting: Oncology

## 2013-03-23 ENCOUNTER — Ambulatory Visit (HOSPITAL_BASED_OUTPATIENT_CLINIC_OR_DEPARTMENT_OTHER): Payer: BC Managed Care – PPO

## 2013-03-23 ENCOUNTER — Encounter: Payer: Self-pay | Admitting: Adult Health

## 2013-03-23 ENCOUNTER — Ambulatory Visit (HOSPITAL_BASED_OUTPATIENT_CLINIC_OR_DEPARTMENT_OTHER): Payer: BC Managed Care – PPO | Admitting: Adult Health

## 2013-03-23 ENCOUNTER — Other Ambulatory Visit (HOSPITAL_BASED_OUTPATIENT_CLINIC_OR_DEPARTMENT_OTHER): Payer: BC Managed Care – PPO

## 2013-03-23 VITALS — BP 119/71 | HR 82 | Temp 98.2°F | Resp 20 | Ht 67.0 in | Wt 162.3 lb

## 2013-03-23 DIAGNOSIS — R112 Nausea with vomiting, unspecified: Secondary | ICD-10-CM

## 2013-03-23 DIAGNOSIS — R768 Other specified abnormal immunological findings in serum: Secondary | ICD-10-CM

## 2013-03-23 DIAGNOSIS — E099 Drug or chemical induced diabetes mellitus without complications: Secondary | ICD-10-CM

## 2013-03-23 DIAGNOSIS — R197 Diarrhea, unspecified: Secondary | ICD-10-CM

## 2013-03-23 DIAGNOSIS — C9110 Chronic lymphocytic leukemia of B-cell type not having achieved remission: Secondary | ICD-10-CM

## 2013-03-23 DIAGNOSIS — D89813 Graft-versus-host disease, unspecified: Secondary | ICD-10-CM

## 2013-03-23 DIAGNOSIS — T380X5A Adverse effect of glucocorticoids and synthetic analogues, initial encounter: Secondary | ICD-10-CM

## 2013-03-23 DIAGNOSIS — I4891 Unspecified atrial fibrillation: Secondary | ICD-10-CM

## 2013-03-23 DIAGNOSIS — Z9489 Other transplanted organ and tissue status: Secondary | ICD-10-CM

## 2013-03-23 DIAGNOSIS — R894 Abnormal immunological findings in specimens from other organs, systems and tissues: Secondary | ICD-10-CM

## 2013-03-23 DIAGNOSIS — K449 Diaphragmatic hernia without obstruction or gangrene: Secondary | ICD-10-CM

## 2013-03-23 DIAGNOSIS — A0472 Enterocolitis due to Clostridium difficile, not specified as recurrent: Secondary | ICD-10-CM

## 2013-03-23 DIAGNOSIS — D61818 Other pancytopenia: Secondary | ICD-10-CM

## 2013-03-23 DIAGNOSIS — C9200 Acute myeloblastic leukemia, not having achieved remission: Secondary | ICD-10-CM

## 2013-03-23 DIAGNOSIS — D89811 Chronic graft-versus-host disease: Secondary | ICD-10-CM

## 2013-03-23 DIAGNOSIS — R21 Rash and other nonspecific skin eruption: Secondary | ICD-10-CM

## 2013-03-23 LAB — COMPREHENSIVE METABOLIC PANEL (CC13)
ALT: 33 U/L (ref 0–55)
AST: 17 U/L (ref 5–34)
Albumin: 2.6 g/dL — ABNORMAL LOW (ref 3.5–5.0)
Alkaline Phosphatase: 87 U/L (ref 40–150)
Calcium: 8.4 mg/dL (ref 8.4–10.4)
Chloride: 113 mEq/L — ABNORMAL HIGH (ref 98–107)
Potassium: 5.6 mEq/L — ABNORMAL HIGH (ref 3.5–5.1)
Sodium: 134 mEq/L — ABNORMAL LOW (ref 136–145)

## 2013-03-23 LAB — CBC WITH DIFFERENTIAL/PLATELET
BASO%: 0 % (ref 0.0–2.0)
Basophils Absolute: 0 10*3/uL (ref 0.0–0.1)
EOS%: 0.2 % (ref 0.0–7.0)
HGB: 10.8 g/dL — ABNORMAL LOW (ref 13.0–17.1)
MCH: 30.3 pg (ref 27.2–33.4)
MCHC: 33.2 g/dL (ref 32.0–36.0)
MCV: 91.3 fL (ref 79.3–98.0)
MONO%: 5.4 % (ref 0.0–14.0)
RBC: 3.56 10*6/uL — ABNORMAL LOW (ref 4.20–5.82)
RDW: 15 % — ABNORMAL HIGH (ref 11.0–14.6)
lymph#: 1.9 10*3/uL (ref 0.9–3.3)

## 2013-03-23 LAB — LACTATE DEHYDROGENASE (CC13): LDH: 173 U/L (ref 125–245)

## 2013-03-23 MED ORDER — PREDNISONE 20 MG PO TABS: 40.0000 mg | ORAL_TABLET | Freq: Every day | ORAL | Status: DC

## 2013-03-23 MED ORDER — PREDNISONE 10 MG PO TABS: 10.0000 mg | ORAL_TABLET | Freq: Every day | ORAL | Status: DC

## 2013-03-23 MED ORDER — MAGNESIUM SULFATE 50 % IJ SOLN
INTRAMUSCULAR | Status: DC
  Administered 2013-03-23: 12:00:00 via INTRAVENOUS
  Filled 2013-03-23: qty 500

## 2013-03-23 MED ORDER — SODIUM CHLORIDE 0.9 % IJ SOLN
10.0000 mL | INTRAMUSCULAR | Status: DC | PRN
  Administered 2013-03-23: 10 mL via INTRAVENOUS
  Filled 2013-03-23: qty 10

## 2013-03-23 MED ORDER — METRONIDAZOLE 500 MG PO TABS: 500.0000 mg | ORAL_TABLET | Freq: Three times a day (TID) | ORAL | Status: DC

## 2013-03-23 MED ORDER — HEPARIN SOD (PORK) LOCK FLUSH 100 UNIT/ML IV SOLN
500.0000 [IU] | Freq: Once | INTRAVENOUS | Status: AC
  Administered 2013-03-23: 500 [IU] via INTRAVENOUS
  Filled 2013-03-23: qty 5

## 2013-03-23 NOTE — Progress Notes (Signed)
ID: Timothy [name]   DOB: 25-Apr-1946  MR#: [id number]  WUJ#:[id number]  NFA:OZHY,Q DOUGLAS, MD SU: OTHER MD: Donzetta Starch  HISTORY OF PRESENT ILLNESS: We have very complete records from Dr. Sydnee Levans in [place], and in summary:  The patient was initially diagnosed in August 2000, with a white cell count of 23,600, but normal hemoglobin and platelets, and no significant symptomatology. Over the next several years his white cell count drifted up, and he eventually developed some symptoms of night sweats in particular, leading to treatment with fludarabine, Cytoxan and rituxan for five cycles given between December 2006 and May 2007.  We have CT scans from June 2006, November 2006 and April 2007, and comparing the November 2006 and April 2007 scans, there was near complete response. He had subsequent therapy in [place] as detailed below, but with decreased response, leading to allogeneic stem-cell transplant at the [facility] 02/24/2012.  INTERVAL HISTORY: Duward returns with his wife Timothy [name]  for followup of his CLL, status post allogeneic stem cell transplant in [place] March 2013.   Aloysius continues to feel weak and tired.  His diarrhea has improved. He had 3 bowel movements yesterday, which is better than 7-8 per day.  He is taking the Questran and Flagyl as directed.  He denies weight loss, night sweats, fevers, chills, or any other concerns.  He is requesting a Flagyl and a Prednisone refill today.    PAST MEDICAL HISTORY: Past Medical History  Diagnosis Date  . 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 Diverticular disease   . Hyperlipidemia   . Obesity   . Hypertension   . Hiatal hernia   . CMV (cytomegalovirus) antibody positive     pre-transplant, with seroconversion x2 pst-transplant  . Right bundle branch block     pre-transplant  . CKD (chronic kidney disease) stage 2, GFR 60-89 ml/min   . Pancytopenia   . Steroid-induced  diabetes   . Atrial fibrillation     post-transplant  . Myopathy   . 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 Leukemia, chronic lymphoid   . 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 Diarrhea in adult patient 12/05/2012    Due to active GVHD  . CLL (chronic lymphocytic leukemia) 12/05/2012    Dx 07/1999; started Rx 12/06  AlloBMT 3/13  . Rash of face 12/05/2012    Due to GVHD  . Hypomagnesemia 01/26/2013    PAST SURGICAL HISTORY: Past Surgical History  Procedure Laterality Date  . Tonsillectomy and adenoidectomy    . Bone marrow transplant    . Flexible sigmoidoscopy  11/17/2012    Procedure: FLEXIBLE SIGMOIDOSCOPY;  Surgeon: Petra Kuba, MD;  Location: [facility];  Service: Endoscopy;  Laterality: N/A;  Dr Ewing Schlein states will be admitted to rooom 1339 11/16/12  . Esophagogastroduodenoscopy  11/17/2012    Procedure: ESOPHAGOGASTRODUODENOSCOPY (EGD);  Surgeon: Petra Kuba, MD;  Location: [facility];  Service: Endoscopy;  Laterality: N/A;    FAMILY HISTORY Family History  Problem Relation Age of Onset  . Cancer Father    The patient's father died from complications of chronic lymphocytic leukemia at the age of 65.  It had been diagnosed seven years before when he was 39.  The patient is enrolled in a familial chronic lymphocytic leukemia study out of the [facility].  The patient's mother is 48, alive, unfortunately suffering with dementia, and  he has a brother, 59, who is otherwise in fair health.   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  His wife of >40 years, Timothy [name], is a homemaker.  Their daughter, Timothy [name], lives in [place].  She also is a Futures trader.  The patient has an 68 year old grandson and an 64-year-old granddaughter, and that is really the main reason he  moved to this area.  He is a International aid/development worker.     ADVANCED DIRECTIVES:  HEALTH MAINTENANCE: History  Substance Use Topics  . Smoking status: Never Smoker   . Smokeless tobacco: Never Used  . Alcohol Use: No     Colonoscopy:  PSA:  Bone density:  Lipid panel:  Allergies  Allergen Reactions  . Benadryl (Diphenhydramine Hcl)     "Restless leg syndrome"    Current Outpatient Prescriptions  Medication Sig Dispense Refill  . acyclovir (ZOVIRAX) 400 MG tablet Take 2 tablets (800 mg total) by mouth 2 (two) times daily.  60 tablet  12  . antiseptic oral rinse (BIOTENE) LIQD 15 mLs by Mouth Rinse route 2 times daily at 12 noon and 4 pm.  237 mL  6  . B-D UF III MINI PEN NEEDLES 31G X 5 MM MISC       . Beclomethasone Dipropionate POWD 2mg  beclomethasone in corn oil ( 2ml ) four times a day with food. Compounded at [facility] - [phone]  1 Bottle  0  . Blood Glucose Monitoring Suppl (ONE TOUCH ULTRA 2) W/DEVICE KIT       . budesonide (ENTOCORT EC) 3 MG 24 hr capsule Take 9 mg by mouth 3 (three) times daily.      . Calcium Citrate (CITRACAL PO) Take 1,200 mg by mouth 2 (two) times daily.      . cholecalciferol (VITAMIN D) 1000 UNITS tablet Take 1,000 Units by mouth every evening.       . cholestyramine (QUESTRAN) 4 G packet Take 1 packet by mouth 2 (two) times daily with a meal.  60 each  1  . dronabinol (MARINOL) 2.5 MG capsule Take 1 capsule (2.5 mg total) by mouth 2 (two) times daily before lunch and supper.  60 capsule  0  . fluconazole (DIFLUCAN) 100 MG tablet Take 1 tablet (100 mg total) by mouth every morning.  30 tablet  12  . HYDROcodone-acetaminophen (NORCO) 10-325 MG per tablet Take 1 tablet by mouth daily as needed for pain (may take 1/2 to 1 tablet in the am as needed for pain).      Marland Kitchen HYDROcodone-acetaminophen (NORCO) 10-325 MG per tablet Take 0.5 tablets by mouth daily as needed.  30 tablet  0  . insulin aspart protamine-insulin aspart (NOVOLOG 70/30) (70-30) 100 UNIT/ML  injection Inject 14 Units into the skin daily with breakfast.  10 mL  12  . insulin aspart protamine-insulin aspart (NOVOLOG 70/30) (70-30) 100 UNIT/ML injection Inject 7 Units into the skin daily with supper.  10 mL  12  . labetalol (NORMODYNE) 200 MG tablet Take 1 tablet (200 mg total) by mouth 2 (two) times daily.  60 tablet  12  . Lidocaine-Hydrocortisone Ace 3-0.5 % KIT       . Multiple Vitamins-Minerals (CENTRUM SILVER ADULT 50+) TABS Take 1 tablet by mouth every evening.       . nateglinide (STARLIX) 120 MG tablet Take 1 tablet (120 mg total) by mouth 3 (  three) times daily before meals.  90 tablet  11  . Nutritional Supplements (FEEDING SUPPLEMENT, VITAL 1.5 CAL,) LIQD Take 237 mLs by mouth 2 (two) times daily.  24 Can  12  . omeprazole (PRILOSEC) 20 MG capsule Take 20 mg by mouth daily.      . ONE TOUCH ULTRA TEST test strip       . ONETOUCH DELICA LANCETS 33G MISC       . predniSONE (DELTASONE) 20 MG tablet Take 50 mg by mouth daily with breakfast.      . sertraline (ZOLOFT) 50 MG tablet Takes 50 mg one day and 100 mg the next day  90 tablet  3  . Sodium Chloride Flush (NORMAL SALINE FLUSH) 0.9 % SOLN       . sulfamethoxazole-trimethoprim (BACTRIM DS) 800-160 MG per tablet Take 1 tablet by mouth daily.  30 tablet  11  . tacrolimus (PROGRAF) 0.5 MG capsule Take 1.5 mg by mouth 2 (two) times daily.      Marland Kitchen testosterone (ANDRODERM) 4 MG/24HR PT24 patch Place 1 patch onto the skin daily.  30 patch  3  . zinc gluconate 50 MG tablet Take 50 mg by mouth daily.      . metroNIDAZOLE (FLAGYL) 500 MG tablet Take 1 tablet (500 mg total) by mouth 3 (three) times daily.  21 tablet  2  . predniSONE (DELTASONE) 10 MG tablet Take 1 tablet (10 mg total) by mouth daily.  30 tablet  2  . predniSONE (DELTASONE) 20 MG tablet Take 2 tablets (40 mg total) by mouth daily.  60 tablet  2   Current Facility-Administered Medications  Medication Dose Route Frequency Provider Last Rate Last Dose  . sodium chloride  0.9 % 500 mL with magnesium sulfate 1 g infusion   Intravenous Continuous Augustin Schooling, NP       Facility-Administered Medications Ordered in Other Visits  Medication Dose Route Frequency Provider Last Rate Last Dose  . 0.9 %  sodium chloride infusion   Intravenous Continuous Amy G Berry, PA-C 500 mL/hr at 03/12/13 0900    . sodium chloride 0.9 % injection 10 mL  10 mL Intravenous PRN Lowella Dell, MD   10 mL at 08/11/12 1606    OBJECTIVE: Middle-aged white male who appears weak and tired but is in no acute distress Filed Vitals:   03/23/13 1044  BP: 119/71  Pulse: 82  Temp: 98.2 F (36.8 C)  Resp: 20     Body mass index is 25.41 kg/(m^2).    Filed Weights   03/23/13 1044  Weight: 162 lb 4.8 oz (73.619 kg)   ECOG FS: 2  General: Patient is a well appearing male in no acute distress HEENT: PERRLA, sclerae anicteric no conjunctival pallor, MMM Neck: supple, no palpable adenopathy Lungs: clear to auscultation bilaterally, no wheezes, rhonchi, or rales Cardiovascular: regular rate rhythm, S1, S2, no murmurs, rubs or gallops Abdomen: Soft, non-tender, non-distended, normoactive bowel sounds, no HSM Extremities: warm and well perfused, no clubbing, cyanosis, or edema Skin: No rashes or lesions Neuro: Non-focal    LAB RESULTS:      Chemistry      Component Value Date/Time   NA 134* 03/23/2013 1021   NA 135 03/18/2013 0615   K 5.6* 03/23/2013 1021   K 5.2* 03/18/2013 0615   CL 113* 03/23/2013 1021   CL 113* 03/18/2013 0615   CO2 15* 03/23/2013 1021   CO2 13* 03/18/2013 0615   BUN 46.0* 03/23/2013 1021  BUN 44* 03/18/2013 0615   CREATININE 1.1 03/23/2013 1021   CREATININE 0.91 03/18/2013 0615      Component Value Date/Time   CALCIUM 8.4 03/23/2013 1021   CALCIUM 7.9* 03/18/2013 0615   ALKPHOS 87 03/23/2013 1021   ALKPHOS 73 03/18/2013 0615   AST 17 03/23/2013 1021   AST 11 03/18/2013 0615   ALT 33 03/23/2013 1021   ALT 24 03/18/2013 0615   BILITOT 0.22 03/23/2013 1021   BILITOT  0.2* 03/18/2013 0615      STUDIES:  No results found.   ASSESSMENT: 67 y.o. [place] man with a history of well-differentiated lymphocytic lymphoma/ chronic lymphoid leukemia initially diagnosed in 2000, not requiring intervention until 2006; with multiple chromosomal abnormalities.  His treatment history is as follows:  (1) fludarabine/cyclophosphamide/rituximab x5 completed May 2007.   (2) rituximab for 8 doses October 2010, with partial response   (3) Leustatin and ofatumumab weekly x8 July to September 2011 followed by maintenance ofatumumab maintenance ofatumumab every 2 months, with initial response but rising counts September 2012   (4) status-post unrelated donor stem-cell transplant [facility]/24/2012 at [facility]  (a) conditioning regimen consisted of fludarabine + TBI at 200 cGy, followed by rituximab x27;  (b) CMV reactivation x3 (patient CMV positive, donor negative), s/p ganciclovir treatment; 3d reactivation August 2013, s/p gancyclovir, with negative PCR mid-September 2013; last gancyclovir dose 10/06/2012 (c) Chronic GVHD: involving gut and skin, treated with steroids, tacrolimus and MMF.  MMF was eventually d/c'd and tacrolimus currently at a dose of 1.5mg  BID (d) atrial fibrillation: resolved on brief amiodarone regimen (e) steroid-induced myopathy: improving  (f) hypomagnesemia: improved after d/c gancyclovir (g) hypogammaglobulinemia: s/p IVIG most recently 01/07/2013. (h) history of elevated triglycerides (606 on 07/14/2012)  (i) adrenal insufficiency: on prednisone and budesonide (j) pancytopenia, improved  (5) restaging studies September 2013  including CT scans, flow cytometry, and bone marrow biopsy, showed no evidence of residual chronic lymphoid leukemia.  (6) recurrent GVHD (skin rash, mouth changes, severe diarrhea and gastric/duodenal/colonic biopsies 11/17/2012 c/w GVHD grade 2) now only remaining sign is bothersome diarrhea; c diff negative x3; continuing  current regimen   (7) severe malnutrition -- on VITAL supplement in addition to regular diet; on Marinol for anorexia  (8) testosterone deficiency--on patch   (9) deconditioning: ongoing REHAB   (10) mild dehydration: encouraged increased po fluids; receives IVF support w magnesium every Mon, Th and Sat  (11) steroid-induced osteoporosis with compression fractures: received pamidronate 12/18/2012   (12) nausea: improved on current meds  (13)  Positive c.diff, 03/08/2013, on Flagyl 500 mg TID x 10 days  PLAN: Banjamin will continue to receive IV fluids with magnesium 3 times weekly. I have ordered his IV fluids with Magnesium today.    His diarrhea is starting to improve on the Flagyl.  I have given him another week of this.  Should it start to worsen, PO Vanc could be considered.    I refilled his Prednisone for his GVHD today, he will stay on 50mg  daily for the time being.   He will follow up in one week with Zollie Scale PA-C or Dr. Darnelle Catalan.    Haris  will need a repeat CT of the abdomen and pelvis in about 3 months, and a colonoscopy sometime this spring. He will be back in [place] in 6 months.  In the meanwhile, he knows to call with any changes or problems.  I spent 25 minutes counseling the patient face to face.  The total time spent  in the appointment was 30 minutes.  Cherie Ouch Lyn Hollingshead, NP Medical Oncology [facility] Phone: [phone]    .Augustin Schooling    03/23/2013

## 2013-03-23 NOTE — Telephone Encounter (Signed)
Error

## 2013-03-23 NOTE — Patient Instructions (Signed)
Continue Flagyl and Questran.  Proceed with IV fluids and magnesium today. We will schedule your appointments for follow up next week.  Please call us if you have any questions or concerns.

## 2013-03-23 NOTE — Patient Instructions (Addendum)
Dehydration, Adult Dehydration is when you lose more fluids from the body than you take in. Vital organs like the kidneys, brain, and heart cannot function without a proper amount of fluids and salt. Any loss of fluids from the body can cause dehydration.  CAUSES   Vomiting.  Diarrhea.  Excessive sweating.  Excessive urine output.  Fever. SYMPTOMS  Mild dehydration  Thirst.  Dry lips.  Slightly dry mouth. Moderate dehydration  Very dry mouth.  Sunken eyes.  Skin does not bounce back quickly when lightly pinched and released.  Dark urine and decreased urine production.  Decreased tear production.  Headache. Severe dehydration  Very dry mouth.  Extreme thirst.  Rapid, weak pulse (more than 100 beats per minute at rest).  Cold hands and feet.  Not able to sweat in spite of heat and temperature.  Rapid breathing.  Blue lips.  Confusion and lethargy.  Difficulty being awakened.  Minimal urine production.  No tears. DIAGNOSIS  Your caregiver will diagnose dehydration based on your symptoms and your exam. Blood and urine tests will help confirm the diagnosis. The diagnostic evaluation should also identify the cause of dehydration. TREATMENT  Treatment of mild or moderate dehydration can often be done at home by increasing the amount of fluids that you drink. It is best to drink small amounts of fluid more often. Drinking too much at one time can make vomiting worse. Refer to the home care instructions below. Severe dehydration needs to be treated at the hospital where you will probably be given intravenous (IV) fluids that contain water and electrolytes. HOME CARE INSTRUCTIONS   Ask your caregiver about specific rehydration instructions.  Drink enough fluids to keep your urine clear or pale yellow.  Drink small amounts frequently if you have nausea and vomiting.  Eat as you normally do.  Avoid:  Foods or drinks high in sugar.  Carbonated  drinks.  Juice.  Extremely hot or cold fluids.  Drinks with caffeine.  Fatty, greasy foods.  Alcohol.  Tobacco.  Overeating.  Gelatin desserts.  Wash your hands well to avoid spreading bacteria and viruses.  Only take over-the-counter or prescription medicines for pain, discomfort, or fever as directed by your caregiver.  Ask your caregiver if you should continue all prescribed and over-the-counter medicines.  Keep all follow-up appointments with your caregiver. SEEK MEDICAL CARE IF:  You have abdominal pain and it increases or stays in one area (localizes).  You have a rash, stiff neck, or severe headache.  You are irritable, sleepy, or difficult to awaken.  You are weak, dizzy, or extremely thirsty. SEEK IMMEDIATE MEDICAL CARE IF:   You are unable to keep fluids down or you get worse despite treatment.  You have frequent episodes of vomiting or diarrhea.  You have blood or green matter (bile) in your vomit.  You have blood in your stool or your stool looks black and tarry.  You have not urinated in 6 to 8 hours, or you have only urinated a small amount of very dark urine.  You have a fever.  You faint. MAKE SURE YOU:   Understand these instructions.  Will watch your condition.  Will get help right away if you are not doing well or get worse. Document Released: 12/08/2005 Document Revised: 03/01/2012 Document Reviewed: 07/28/2011 Exit[employer] Patient Information 2013 [employer].  

## 2013-03-23 NOTE — Telephone Encounter (Signed)
gv pt wife appt schedule for April.

## 2013-03-24 ENCOUNTER — Other Ambulatory Visit: Payer: Self-pay | Admitting: Physician Assistant

## 2013-03-24 DIAGNOSIS — D89811 Chronic graft-versus-host disease: Secondary | ICD-10-CM

## 2013-03-24 DIAGNOSIS — Z9489 Other transplanted organ and tissue status: Secondary | ICD-10-CM

## 2013-03-24 DIAGNOSIS — C9110 Chronic lymphocytic leukemia of B-cell type not having achieved remission: Secondary | ICD-10-CM

## 2013-03-25 ENCOUNTER — Other Ambulatory Visit (HOSPITAL_BASED_OUTPATIENT_CLINIC_OR_DEPARTMENT_OTHER): Payer: BC Managed Care – PPO

## 2013-03-25 ENCOUNTER — Ambulatory Visit (HOSPITAL_BASED_OUTPATIENT_CLINIC_OR_DEPARTMENT_OTHER): Payer: BC Managed Care – PPO

## 2013-03-25 ENCOUNTER — Telehealth: Payer: Self-pay | Admitting: Endocrinology

## 2013-03-25 DIAGNOSIS — T8609 Other complications of bone marrow transplant: Secondary | ICD-10-CM

## 2013-03-25 DIAGNOSIS — Z452 Encounter for adjustment and management of vascular access device: Secondary | ICD-10-CM

## 2013-03-25 DIAGNOSIS — C9110 Chronic lymphocytic leukemia of B-cell type not having achieved remission: Secondary | ICD-10-CM

## 2013-03-25 DIAGNOSIS — E860 Dehydration: Secondary | ICD-10-CM

## 2013-03-25 DIAGNOSIS — R112 Nausea with vomiting, unspecified: Secondary | ICD-10-CM

## 2013-03-25 DIAGNOSIS — D89811 Chronic graft-versus-host disease: Secondary | ICD-10-CM

## 2013-03-25 DIAGNOSIS — C8599 Non-Hodgkin lymphoma, unspecified, extranodal and solid organ sites: Secondary | ICD-10-CM

## 2013-03-25 LAB — COMPREHENSIVE METABOLIC PANEL (CC13)
AST: 18 U/L (ref 5–34)
Alkaline Phosphatase: 88 U/L (ref 40–150)
BUN: 43.1 mg/dL — ABNORMAL HIGH (ref 7.0–26.0)
Glucose: 145 mg/dl — ABNORMAL HIGH (ref 70–99)
Potassium: 5.6 mEq/L — ABNORMAL HIGH (ref 3.5–5.1)
Sodium: 132 mEq/L — ABNORMAL LOW (ref 136–145)
Total Bilirubin: 0.27 mg/dL (ref 0.20–1.20)

## 2013-03-25 LAB — CBC WITH DIFFERENTIAL/PLATELET
BASO%: 0 % (ref 0.0–2.0)
Basophils Absolute: 0 10*3/uL (ref 0.0–0.1)
EOS%: 0 % (ref 0.0–7.0)
HGB: 10.4 g/dL — ABNORMAL LOW (ref 13.0–17.1)
MCH: 30.1 pg (ref 27.2–33.4)
MCHC: 32.9 g/dL (ref 32.0–36.0)
MCV: 91.6 fL (ref 79.3–98.0)
MONO%: 5.8 % (ref 0.0–14.0)
NEUT%: 75.2 % — ABNORMAL HIGH (ref 39.0–75.0)
RDW: 15.4 % — ABNORMAL HIGH (ref 11.0–14.6)
lymph#: 1 10*3/uL (ref 0.9–3.3)

## 2013-03-25 MED ORDER — SODIUM CHLORIDE 0.9 % IJ SOLN
10.0000 mL | INTRAMUSCULAR | Status: DC | PRN
  Administered 2013-03-25: 10 mL via INTRAVENOUS
  Filled 2013-03-25: qty 10

## 2013-03-25 MED ORDER — HEPARIN SOD (PORK) LOCK FLUSH 100 UNIT/ML IV SOLN
500.0000 [IU] | Freq: Once | INTRAVENOUS | Status: AC
  Administered 2013-03-25: 250 [IU] via INTRAVENOUS
  Filled 2013-03-25: qty 5

## 2013-03-25 MED ORDER — SODIUM CHLORIDE 0.9 % IV SOLN
INTRAVENOUS | Status: DC
  Administered 2013-03-25: 12:00:00 via INTRAVENOUS

## 2013-03-25 MED ORDER — SODIUM CHLORIDE 0.9 % IV SOLN
Freq: Once | INTRAVENOUS | Status: AC
  Administered 2013-03-25: 12:00:00 via INTRAVENOUS
  Filled 2013-03-25: qty 500

## 2013-03-25 NOTE — Telephone Encounter (Signed)
Per Voicemail left by patient - pt would like to speak w/ nurse about conflicting info regarding meds. Please call [phone] or [phone] / Sherri S.

## 2013-03-25 NOTE — Patient Instructions (Addendum)
Hypomagnesemia Magnesium is a common ion (mineral) in the body which is needed for metabolism. It is about how the body handles food and other chemical reactions necessary for life. Only about 2% of the magnesium in our body is found in the blood. When this is low, it is called hypomagnesemia. The blood will measure only a tiny amount of the magnesium in our body. When it is low in our blood, it does not mean that the whole body supply is low. The normal serum concentration ranges from 1.8-2.5 mEq/L. When the level gets to be less than 1.0 mEq/L, a number of problems begin to happen.  CAUSES   Receiving intravenous fluids without magnesium replacement.  Loss of magnesium from the bowel by naso-gastric suction.  Loss of magnesium from nausea and vomiting or severe diarrhea. Any of the inflammatory bowel conditions can cause this.  Abuse of alcohol often leads to low serum magnesium.  An inherited form of magnesium loss happens when the kidneys lose magnesium. This is called familial or primary hypomagnesemia.  Some medications such as diuretics also cause the loss of magnesium. SYMPTOMS  These following problems are worse if the changes in magnesium levels come on suddenly.  Tremor.  Confusion.  Muscle weakness.  Over-sensitive to sights and sounds.  Sensitive reflexes.  Depression.  Muscular fibrillations.  Over-reactivity of the nerves.  Irritability.  Psychosis.  Spasms of the hand muscles.  Tetany (where the muscles go into uncontrollable spasms). DIAGNOSIS  This condition can be diagnosed by blood tests. TREATMENT   In emergency, magnesium can be given intravenously (by vein).  If the condition is less worrisome, it can be corrected by diet. High levels of magnesium are found in green leafy vegetables, peas, beans and nuts among other things. It can also be given through medications by mouth.  If it is being caused by medications, changes can be made.  If  alcohol is a problem, help is available if there are difficulties giving it up. Document Released: 09/03/2005 Document Revised: 03/01/2012 Document Reviewed: 07/28/2008 Exit[employer] Patient Information 2013 [employer].  

## 2013-03-25 NOTE — Telephone Encounter (Signed)
Pt's wife states pt was in hospital cbs was over 300, pt's wife states they are very concerned about pt taking Starlix and being placed on Novolog  70/30, 14 units qam and 7 units qpm, will these drugs interact, please advise.

## 2013-03-28 ENCOUNTER — Other Ambulatory Visit (HOSPITAL_BASED_OUTPATIENT_CLINIC_OR_DEPARTMENT_OTHER): Payer: BC Managed Care – PPO

## 2013-03-28 ENCOUNTER — Telehealth: Payer: Self-pay | Admitting: *Deleted

## 2013-03-28 ENCOUNTER — Ambulatory Visit (HOSPITAL_BASED_OUTPATIENT_CLINIC_OR_DEPARTMENT_OTHER): Payer: BC Managed Care – PPO | Admitting: Oncology

## 2013-03-28 ENCOUNTER — Ambulatory Visit: Payer: Medicare Other | Admitting: Lab

## 2013-03-28 ENCOUNTER — Ambulatory Visit (HOSPITAL_BASED_OUTPATIENT_CLINIC_OR_DEPARTMENT_OTHER): Payer: BC Managed Care – PPO

## 2013-03-28 VITALS — BP 130/75 | HR 80 | Temp 98.4°F | Resp 20 | Ht 67.0 in | Wt 159.2 lb

## 2013-03-28 DIAGNOSIS — E875 Hyperkalemia: Secondary | ICD-10-CM

## 2013-03-28 DIAGNOSIS — E119 Type 2 diabetes mellitus without complications: Secondary | ICD-10-CM

## 2013-03-28 DIAGNOSIS — Z9489 Other transplanted organ and tissue status: Secondary | ICD-10-CM

## 2013-03-28 DIAGNOSIS — D89811 Chronic graft-versus-host disease: Secondary | ICD-10-CM

## 2013-03-28 DIAGNOSIS — C9110 Chronic lymphocytic leukemia of B-cell type not having achieved remission: Secondary | ICD-10-CM

## 2013-03-28 DIAGNOSIS — A0472 Enterocolitis due to Clostridium difficile, not specified as recurrent: Secondary | ICD-10-CM

## 2013-03-28 DIAGNOSIS — E860 Dehydration: Secondary | ICD-10-CM

## 2013-03-28 LAB — CBC WITH DIFFERENTIAL/PLATELET
Basophils Absolute: 0 10*3/uL (ref 0.0–0.1)
Eosinophils Absolute: 0 10*3/uL (ref 0.0–0.5)
HCT: 33.9 % — ABNORMAL LOW (ref 38.4–49.9)
LYMPH%: 22.4 % (ref 14.0–49.0)
MCV: 91.9 fL (ref 79.3–98.0)
MONO#: 0.4 10*3/uL (ref 0.1–0.9)
MONO%: 6.5 % (ref 0.0–14.0)
NEUT#: 4.6 10*3/uL (ref 1.5–6.5)
NEUT%: 71.1 % (ref 39.0–75.0)
Platelets: 96 10*3/uL — ABNORMAL LOW (ref 140–400)
WBC: 6.4 10*3/uL (ref 4.0–10.3)

## 2013-03-28 LAB — COMPREHENSIVE METABOLIC PANEL (CC13)
AST: 17 U/L (ref 5–34)
Albumin: 2.7 g/dL — ABNORMAL LOW (ref 3.5–5.0)
Alkaline Phosphatase: 98 U/L (ref 40–150)
Glucose: 203 mg/dl — ABNORMAL HIGH (ref 70–99)
Potassium: 5.5 mEq/L — ABNORMAL HIGH (ref 3.5–5.1)
Sodium: 135 mEq/L — ABNORMAL LOW (ref 136–145)
Total Bilirubin: 0.32 mg/dL (ref 0.20–1.20)
Total Protein: 4.7 g/dL — ABNORMAL LOW (ref 6.4–8.3)

## 2013-03-28 MED ORDER — SODIUM CHLORIDE 0.9 % IV SOLN
Freq: Once | INTRAVENOUS | Status: AC
  Administered 2013-03-28: 13:00:00 via INTRAVENOUS
  Filled 2013-03-28: qty 500

## 2013-03-28 NOTE — Progress Notes (Signed)
ID: Timothy [name]   DOB: [birth year]  MR#: [id number]  WUJ#:[id number]  NFA:Timothy DOUGLAS, MD SU: OTHER MD: Timothy [name]  HISTORY OF PRESENT ILLNESS: We have very complete records from Timothy [name] in [place], and in summary:  The patient was initially diagnosed in August 2000, with a white cell count of 23,600, but normal hemoglobin and platelets, and no significant symptomatology. Over the next several years his white cell count drifted up, and he eventually developed some symptoms of night sweats in particular, leading to treatment with fludarabine, Cytoxan and rituxan for five cycles given between December 2006 and May 2007.  We have CT scans from June 2006, November 2006 and April 2007, and comparing the November 2006 and April 2007 scans, there was near complete response. He had subsequent therapy in [place] as detailed below, but with decreased response, leading to allogeneic stem-cell transplant at the [facility] 02/24/2012.  INTERVAL HISTORY: Timothy [name] returns with his wife Timothy [name]  for followup of his CLL, status post allogeneic stem cell transplant in [place] March 2013. Since his last visit here he was admitted with what turned out to be C. difficile colitis and poorly controlled blood sugars. He has been now on Flagyl for 17 days, and in addition to his Starlix is taking a insulin (70/30) before breakfast and before supper.    REVIEW OF SYSTEMS: Timothy [name] brought me his blood sugars for the last week, which is still higher than we would like (in the 225 01/25/2024 range. His stools are "back to baseline" meaning 3-4 a day, pdding consistency; he still occasionally doesn't quite make it" masses". He is having more of a balance problem because of not quite knowing where his feet are. He continues to use his hiking poles. They did walk up to 30 minutes this weekend. "We did not go very far". He has some blurred vision and we're going to refer him to [facility] for  further evaluation. He is fatigued him a had the squamous cell cancer in his scalp removed a very successfully, but has had no fever, rash, nausea, vomiting, cough, phlegm production, or pleurisy. He has normal bladder function. There have been no other skin changes.  PAST MEDICAL HISTORY: Past Medical History  Diagnosis Date  . 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 Diverticular disease   . Hyperlipidemia   . Obesity   . Hypertension   . Hiatal hernia   . CMV (cytomegalovirus) antibody positive     pre-transplant, with seroconversion x2 pst-transplant  . Right bundle branch block     pre-transplant  . CKD (chronic kidney disease) stage 2, GFR 60-89 ml/min   . Pancytopenia   . Steroid-induced diabetes   . Atrial fibrillation     post-transplant  . Myopathy   . 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 Leukemia, chronic lymphoid   . 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 Diarrhea in adult patient 12/05/2012    Due to active GVHD  . CLL (chronic lymphocytic leukemia) 12/05/2012    Dx 07/1999; started Rx 12/06  AlloBMT 3/13  . Rash of face 12/05/2012    Due to GVHD  . Hypomagnesemia 01/26/2013    PAST SURGICAL HISTORY: Past Surgical History  Procedure Laterality Date  . Tonsillectomy and adenoidectomy    . Bone marrow transplant    . Flexible sigmoidoscopy  11/17/2012    Procedure: FLEXIBLE SIGMOIDOSCOPY;  Surgeon: Timothy Kuba, MD;  Location: [facility];  Service: Endoscopy;  Laterality: N/A;  Dr Timothy [name] states will be admitted to rooom 1339 11/16/12  . Esophagogastroduodenoscopy  11/17/2012    Procedure: ESOPHAGOGASTRODUODENOSCOPY (EGD);  Surgeon: Timothy Kuba, MD;  Location: [facility];  Service: Endoscopy;  Laterality: N/A;    FAMILY HISTORY Family History  Problem Relation Age of Onset  . Cancer Father    The patient's father died from complications of  chronic lymphocytic leukemia at the age of 67.  It had been diagnosed seven years before when he was 53.  The patient is enrolled in a familial chronic lymphocytic leukemia study out of the [facility].  The patient's mother is 69, alive, unfortunately suffering with dementia, and he has a brother, 75, who is otherwise in fair health.   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  His wife of >40 years, Timothy [name], is a homemaker.  Their daughter, Timothy [name], lives in [place].  She also is a Futures trader.  The patient has an 58 year old grandson and an 64-year-old granddaughter, and that is really the main reason he moved to this area.  He is a International aid/development worker.     ADVANCED DIRECTIVES: In place  HEALTH MAINTENANCE: History  Substance Use Topics  . Smoking status: Never Smoker   . Smokeless tobacco: Never Used  . Alcohol Use: No     Colonoscopy:  PSA:  Bone density:  Lipid panel:  Allergies  Allergen Reactions  . Benadryl (Diphenhydramine Hcl)     "Restless leg syndrome"    Current Outpatient Prescriptions  Medication Sig Dispense Refill  . acyclovir (ZOVIRAX) 400 MG tablet Take 2 tablets (800 mg total) by mouth 2 (two) times daily.  60 tablet  12  . antiseptic oral rinse (BIOTENE) LIQD 15 mLs by Mouth Rinse route 2 times daily at 12 noon and 4 pm.  237 mL  6  . B-D UF III MINI PEN NEEDLES 31G X 5 MM MISC       . Beclomethasone Dipropionate POWD 2mg  beclomethasone in corn oil ( 2ml ) four times a day with food. Compounded at [facility] - [phone]  1 Bottle  0  . Blood Glucose Monitoring Suppl (ONE TOUCH ULTRA 2) W/DEVICE KIT       . budesonide (ENTOCORT EC) 3 MG 24 hr capsule Take 9 mg by mouth 3 (three) times daily.      . Calcium Citrate (CITRACAL PO) Take 1,200 mg by mouth 2 (two) times daily.      . cholecalciferol (VITAMIN D) 1000 UNITS tablet Take  1,000 Units by mouth every evening.       . cholestyramine (QUESTRAN) 4 G packet Take 1 packet by mouth 2 (two) times daily with a meal.  60 each  1  . dronabinol (MARINOL) 2.5 MG capsule Take 1 capsule (2.5 mg total) by mouth 2 (two) times daily before lunch and supper.  60 capsule  0  . fluconazole (DIFLUCAN) 100 MG tablet Take 1 tablet (100 mg total) by mouth every morning.  30 tablet  12  . HYDROcodone-acetaminophen (NORCO) 10-325 MG per tablet Take 1 tablet by mouth daily as needed for pain (may take 1/2 to 1 tablet in the am as needed for pain).      Marland Kitchen HYDROcodone-acetaminophen (NORCO) 10-325 MG per tablet Take 0.5 tablets by mouth daily  as needed.  30 tablet  0  . insulin aspart protamine-insulin aspart (NOVOLOG 70/30) (70-30) 100 UNIT/ML injection Inject 14 Units into the skin daily with breakfast.  10 mL  12  . insulin aspart protamine-insulin aspart (NOVOLOG 70/30) (70-30) 100 UNIT/ML injection Inject 7 Units into the skin daily with supper.  10 mL  12  . labetalol (NORMODYNE) 200 MG tablet Take 1 tablet (200 mg total) by mouth 2 (two) times daily.  60 tablet  12  . Lidocaine-Hydrocortisone Ace 3-0.5 % KIT       . metroNIDAZOLE (FLAGYL) 500 MG tablet Take 1 tablet (500 mg total) by mouth 3 (three) times daily.  21 tablet  2  . Multiple Vitamins-Minerals (CENTRUM SILVER ADULT 50+) TABS Take 1 tablet by mouth every evening.       . nateglinide (STARLIX) 120 MG tablet Take 1 tablet (120 mg total) by mouth 3 (three) times daily before meals.  90 tablet  11  . Nutritional Supplements (FEEDING SUPPLEMENT, VITAL 1.5 CAL,) LIQD Take 237 mLs by mouth 2 (two) times daily.  24 Can  12  . omeprazole (PRILOSEC) 20 MG capsule Take 20 mg by mouth daily.      . ONE TOUCH ULTRA TEST test strip       . ONETOUCH DELICA LANCETS 33G MISC       . predniSONE (DELTASONE) 10 MG tablet Take 1 tablet (10 mg total) by mouth daily.  30 tablet  2  . predniSONE (DELTASONE) 20 MG tablet Take 50 mg by mouth daily with  breakfast.      . predniSONE (DELTASONE) 20 MG tablet Take 2 tablets (40 mg total) by mouth daily.  60 tablet  2  . sertraline (ZOLOFT) 50 MG tablet Takes 50 mg one day and 100 mg the next day  90 tablet  3  . Sodium Chloride Flush (NORMAL SALINE FLUSH) 0.9 % SOLN       . sulfamethoxazole-trimethoprim (BACTRIM DS) 800-160 MG per tablet Take 1 tablet by mouth daily.  30 tablet  11  . tacrolimus (PROGRAF) 0.5 MG capsule Take 1.5 mg by mouth 2 (two) times daily.      Marland Kitchen testosterone (ANDRODERM) 4 MG/24HR PT24 patch Place 1 patch onto the skin daily.  30 patch  3  . zinc gluconate 50 MG tablet Take 50 mg by mouth daily.       No current facility-administered medications for this visit.   Facility-Administered Medications Ordered in Other Visits  Medication Dose Route Frequency Provider Last Rate Last Dose  . 0.9 %  sodium chloride infusion   Intravenous Continuous Amy G Berry, PA-C 500 mL/hr at 03/12/13 0900    . sodium chloride 0.9 % injection 10 mL  10 mL Intravenous PRN Lowella Dell, MD   10 mL at 08/11/12 1606    OBJECTIVE: Middle-aged white male in no acute distress Filed Vitals:   03/28/13 1210  BP: 130/75  Pulse: 80  Temp: 98.4 F (36.9 C)  Resp: 20     Body mass index is 24.93 kg/(m^2).    Filed Weights   03/28/13 1210  Weight: 159 lb 3.2 oz (72.213 kg)   ECOG FS: 2  Face shows no rash Sclerae unicteric Oropharynx clear No cervical or supraclavicular adenopathy Lungs no rales or rhonchi Heart regular rate and rhythm Abd soft, nontender, positive bowel sounds MSK no focal spinal tenderness, minimal bilateral lower extremity  edema Neuro: well oriented, positive affect Skin: No rash. Decreased turgor  LAB RESULTS:      Chemistry      Component Value Date/Time   NA 132* 03/25/2013 1107   NA 135 03/18/2013 0615   K 5.6* 03/25/2013 1107   K 5.2* 03/18/2013 0615   CL 110* 03/25/2013 1107   CL 113* 03/18/2013 0615   CO2 17* 03/25/2013 1107   CO2 13* 03/18/2013  0615   BUN 43.1* 03/25/2013 1107   BUN 44* 03/18/2013 0615   CREATININE 1.0 03/25/2013 1107   CREATININE 0.91 03/18/2013 0615      Component Value Date/Time   CALCIUM 8.3* 03/25/2013 1107   CALCIUM 7.9* 03/18/2013 0615   ALKPHOS 88 03/25/2013 1107   ALKPHOS 73 03/18/2013 0615   AST 18 03/25/2013 1107   AST 11 03/18/2013 0615   ALT 38 03/25/2013 1107   ALT 24 03/18/2013 0615   BILITOT 0.27 03/25/2013 1107   BILITOT 0.2* 03/18/2013 0615      STUDIES:  No results found.    ASSESSMENT: 67 y.o. [place] man with a history of well-differentiated lymphocytic lymphoma/ chronic lymphoid leukemia initially diagnosed in 2000, not requiring intervention until 2006; with multiple chromosomal abnormalities.  His treatment history is as follows:  (1) fludarabine/cyclophosphamide/rituximab x5 completed May 2007.   (2) rituximab for 8 doses October 2010, with partial response   (3) Leustatin and ofatumumab weekly x8 July to September 2011 followed by maintenance ofatumumab maintenance ofatumumab every 2 months, with initial response but rising counts September 2012   (4) status-post unrelated donor stem-cell transplant 02/24/2012 at [facility]  (a) conditioning regimen consisted of fludarabine + TBI at 200 cGy, followed by rituximab x27;  (b) CMV reactivation x3 (patient CMV positive, donor negative), s/p ganciclovir treatment; 3d reactivation August 2013, s/p gancyclovir, with negative PCR mid-September 2013; last gancyclovir dose 10/06/2012 (c) Chronic GVHD: involving gut and skin, treated with steroids, tacrolimus and MMF.  MMF was eventually d/c'd and tacrolimus currently at a dose of 1.5mg  BID (d) atrial fibrillation: resolved on brief amiodarone regimen (e) steroid-induced myopathy: improving  (f) hypomagnesemia: improved after d/c gancyclovir (g) hypogammaglobulinemia: s/p IVIG most recently 01/07/2013. (h) history of elevated triglycerides (606 on 07/14/2012)  (i) adrenal insufficiency: on  prednisone and budesonide (j) pancytopenia, improved  (5) restaging studies September 2013  including CT scans, flow cytometry, and bone marrow biopsy, showed no evidence of residual chronic lymphoid leukemia.  (6) recurrent GVHD (skin rash, mouth changes, severe diarrhea and gastric/duodenal/colonic biopsies 11/17/2012 c/w GVHD grade 2) now only remaining sign is bothersome diarrhea; c diff negative x3; continuing current regimen   (7) severe malnutrition -- on VITAL supplement in addition to regular diet; on Marinol for anorexia  (8) testosterone deficiency--on patch   (9) deconditioning: ongoing REHAB   (10) mild dehydration: encouraged increased po fluids; receives IVF support w magnesium every Mon, Th and Sat  (11) steroid-induced osteoporosis with compression fractures: received pamidronate 12/18/2012   (12) nausea: improved on current meds  (13)  Positive c.diff, 03/08/2013, on Flagyl 500 mg TID x 20 days; repeat C. difficile pending  (14) hyperkalemia  (15) steroid induced hyperglycemia, on sterlix and 70/30 insulin  PLAN: He is still not hydrating himself as much as we would like, and we're going to continue IV fluids here every Monday Wednesday Friday at least for the next month. He is hyperkalemia persists, possibly secondary to all does her own deficiency. His stools are normalizing and we will repeat a C. difficile today. I anticipate going off the Flagyl after another 3  days. His sugars are better but not where we want them. I am increasing the 70/30-16 mg before breakfast and 8 before supper, and likely we will go up of by a similar amount next week, until he has no blood sugars of about 200 (ideally above 150). Otherwise he knows to call for any problems that may develop before the next visit, which will be in one week.  Marland KitchenMAGRINAT,GUSTAV C    03/28/2013

## 2013-03-28 NOTE — Patient Instructions (Addendum)
Dehydration, Adult Dehydration is when you lose more fluids from the body than you take in. Vital organs like the kidneys, brain, and heart cannot function without a proper amount of fluids and salt. Any loss of fluids from the body can cause dehydration.  CAUSES   Vomiting.  Diarrhea.  Excessive sweating.  Excessive urine output.  Fever. SYMPTOMS  Mild dehydration  Thirst.  Dry lips.  Slightly dry mouth. Moderate dehydration  Very dry mouth.  Sunken eyes.  Skin does not bounce back quickly when lightly pinched and released.  Dark urine and decreased urine production.  Decreased tear production.  Headache. Severe dehydration  Very dry mouth.  Extreme thirst.  Rapid, weak pulse (more than 100 beats per minute at rest).  Cold hands and feet.  Not able to sweat in spite of heat and temperature.  Rapid breathing.  Blue lips.  Confusion and lethargy.  Difficulty being awakened.  Minimal urine production.  No tears. DIAGNOSIS  Your caregiver will diagnose dehydration based on your symptoms and your exam. Blood and urine tests will help confirm the diagnosis. The diagnostic evaluation should also identify the cause of dehydration. TREATMENT  Treatment of mild or moderate dehydration can often be done at home by increasing the amount of fluids that you drink. It is best to drink small amounts of fluid more often. Drinking too much at one time can make vomiting worse. Refer to the home care instructions below. Severe dehydration needs to be treated at the hospital where you will probably be given intravenous (IV) fluids that contain water and electrolytes. HOME CARE INSTRUCTIONS   Ask your caregiver about specific rehydration instructions.  Drink enough fluids to keep your urine clear or pale yellow.  Drink small amounts frequently if you have nausea and vomiting.  Eat as you normally do.  Avoid:  Foods or drinks high in sugar.  Carbonated  drinks.  Juice.  Extremely hot or cold fluids.  Drinks with caffeine.  Fatty, greasy foods.  Alcohol.  Tobacco.  Overeating.  Gelatin desserts.  Wash your hands well to avoid spreading bacteria and viruses.  Only take over-the-counter or prescription medicines for pain, discomfort, or fever as directed by your caregiver.  Ask your caregiver if you should continue all prescribed and over-the-counter medicines.  Keep all follow-up appointments with your caregiver. SEEK MEDICAL CARE IF:  You have abdominal pain and it increases or stays in one area (localizes).  You have a rash, stiff neck, or severe headache.  You are irritable, sleepy, or difficult to awaken.  You are weak, dizzy, or extremely thirsty. SEEK IMMEDIATE MEDICAL CARE IF:   You are unable to keep fluids down or you get worse despite treatment.  You have frequent episodes of vomiting or diarrhea.  You have blood or green matter (bile) in your vomit.  You have blood in your stool or your stool looks black and tarry.  You have not urinated in 6 to 8 hours, or you have only urinated a small amount of very dark urine.  You have a fever.  You faint. MAKE SURE YOU:   Understand these instructions.  Will watch your condition.  Will get help right away if you are not doing well or get worse. Document Released: 12/08/2005 Document Revised: 03/01/2012 Document Reviewed: 07/28/2011 Exit[employer] Patient Information 2013 [employer].  

## 2013-03-28 NOTE — Telephone Encounter (Signed)
appts made and printed 

## 2013-03-29 ENCOUNTER — Ambulatory Visit: Payer: BC Managed Care – PPO | Admitting: Lab

## 2013-03-29 DIAGNOSIS — A0472 Enterocolitis due to Clostridium difficile, not specified as recurrent: Secondary | ICD-10-CM

## 2013-03-29 LAB — CLOSTRIDIUM DIFFICILE BY PCR: Toxigenic C. Difficile by PCR: POSITIVE — CR

## 2013-03-29 NOTE — Discharge Summary (Signed)
Physician Discharge Summary  Patient ID: Timothy [name] MRN: [id number] DOB/AGE: 05-03-1946 67 y.o.  Admit date: 03/16/2013 Discharge date: 03/18/2013 Primary Care Physician:  Kari Baars, MD   Discharge Diagnoses:  Uncontrolled diabetes; c difficile colitis  Present on Admission:  **None**  Discharge Medications:    Medication List    STOP taking these medications       Beclomethasone Dipropionate Powd     lisinopril 10 MG tablet  Commonly known as:  PRINIVIL,ZESTRIL     metroNIDAZOLE 500 MG tablet  Commonly known as:  FLAGYL      TAKE these medications       acyclovir 400 MG tablet  Commonly known as:  ZOVIRAX  Take 2 tablets (800 mg total) by mouth 2 (two) times daily.     antiseptic oral rinse Liqd  15 mLs by Mouth Rinse route 2 times daily at 12 noon and 4 pm.     budesonide 3 MG 24 hr capsule  Commonly known as:  ENTOCORT EC  Take 9 mg by mouth 3 (three) times daily.     CENTRUM SILVER ADULT 50+ Tabs  Take 1 tablet by mouth every evening.     cholecalciferol 1000 UNITS tablet  Commonly known as:  VITAMIN D  Take 1,000 Units by mouth every evening.     cholestyramine 4 G packet  Commonly known as:  QUESTRAN  Take 1 packet by mouth 2 (two) times daily with a meal.     CITRACAL PO  Take 1,200 mg by mouth 2 (two) times daily.     dronabinol 2.5 MG capsule  Commonly known as:  MARINOL  Take 1 capsule (2.5 mg total) by mouth 2 (two) times daily before lunch and supper.     feeding supplement (VITAL 1.5 CAL) Liqd  Take 237 mLs by mouth 2 (two) times daily.     fluconazole 100 MG tablet  Commonly known as:  DIFLUCAN  Take 1 tablet (100 mg total) by mouth every morning.     HYDROcodone-acetaminophen 10-325 MG per tablet  Commonly known as:  NORCO  Take 0.5 tablets by mouth daily as needed.     HYDROcodone-acetaminophen 10-325 MG per tablet  Commonly known as:  NORCO  Take 1 tablet by mouth daily as needed for pain (may take 1/2 to 1  tablet in the am as needed for pain).     insulin aspart protamine-insulin aspart (70-30) 100 UNIT/ML injection  Commonly known as:  NOVOLOG 70/30  Inject 7 Units into the skin daily with supper.     insulin aspart protamine-insulin aspart (70-30) 100 UNIT/ML injection  Commonly known as:  NOVOLOG 70/30  Inject 14 Units into the skin daily with breakfast.     labetalol 200 MG tablet  Commonly known as:  NORMODYNE  Take 1 tablet (200 mg total) by mouth 2 (two) times daily.     Lidocaine-Hydrocortisone Ace 3-0.5 % Kit     nateglinide 120 MG tablet  Commonly known as:  STARLIX  Take 1 tablet (120 mg total) by mouth 3 (three) times daily before meals.     Normal Saline Flush 0.9 % Soln     omeprazole 20 MG capsule  Commonly known as:  PRILOSEC  Take 20 mg by mouth daily.     predniSONE 20 MG tablet  Commonly known as:  DELTASONE  Take 50 mg by mouth daily with breakfast.     sertraline 50 MG tablet  Commonly known as:  ZOLOFT  Takes 50 mg one day and 100 mg the next day     sulfamethoxazole-trimethoprim 800-160 MG per tablet  Commonly known as:  BACTRIM DS  Take 1 tablet by mouth daily.     tacrolimus 0.5 MG capsule  Commonly known as:  PROGRAF  Take 1.5 mg by mouth 2 (two) times daily.     testosterone 4 MG/24HR Pt24 patch  Commonly known as:  ANDRODERM  Place 1 patch onto the skin daily.     zinc gluconate 50 MG tablet  Take 50 mg by mouth daily.         Disposition and Follow-up: call office for appointment  Significant Diagnostic Studies:  No results found.  Discharge Laboratory Values: Basic Metabolic Panel:  Recent Labs Lab 03/23/13 1021 03/25/13 1107 03/28/13 1153  NA 134* 132* 135*  K 5.6* 5.6* 5.5*  CL 113* 110* 111*  CO2 15* 17* 17*  GLUCOSE 276* 145* 203*  BUN 46.0* 43.1* 42.6*  CREATININE 1.1 1.0 1.0  CALCIUM 8.4 8.3* 8.5  MG  --  1.9 1.9   GFR Estimated Creatinine Clearance: 67.9 ml/min (by C-G formula based on Cr of 1). Liver  Function Tests:  Recent Labs Lab 03/23/13 1021 03/25/13 1107 03/28/13 1153  AST 17 18 17   ALT 33 38 34  ALKPHOS 87 88 98  BILITOT 0.22 0.27 0.32  PROT 4.5* 4.5* 4.7*  ALBUMIN 2.6* 2.5* 2.7*   No results found for this basename: LIPASE, AMYLASE,  in the last 168 hours No results found for this basename: AMMONIA,  in the last 168 hours Coagulation profile No results found for this basename: INR, PROTIME,  in the last 168 hours  CBC:  Recent Labs Lab 03/23/13 1020 03/25/13 1105 03/28/13 1152  WBC 6.5 5.4 6.4  NEUTROABS 4.3 4.0 4.6  HGB 10.8* 10.4* 11.2*  HCT 32.5* 31.6* 33.9*  MCV 91.3 91.6 91.9  PLT 124* 112* 96*   Cardiac Enzymes: No results found for this basename: CKTOTAL, CKMB, CKMBINDEX, TROPONINI,  in the last 168 hours BNP: No components found with this basename: POCBNP,  CBG: No results found for this basename: GLUCAP,  in the last 168 hours D-Dimer No results found for this basename: DDIMER,  in the last 72 hours Hgb A1c No results found for this basename: HGBA1C,  in the last 72 hours Lipid Profile No results found for this basename: CHOL, HDL, LDLCALC, TRIG, CHOLHDL, LDLDIRECT,  in the last 72 hours Thyroid function studies No results found for this basename: TSH, T4TOTAL, FREET3, T3FREE, THYROIDAB,  in the last 72 hours Anemia work up No results found for this basename: VITAMINB12, FOLATE, FERRITIN, TIBC, IRON, RETICCTPCT,  in the last 72 hours Microbiology No results found for this or any previous visit (from the past 240 hour(s)).   Brief H and P: For complete details please refer to admission H and P, but in brief, patient's c difficile diarrhea and steroid induced diabetes were poorly controlled at home. See hospital course for full details  Physical Exam at Discharge: BP 146/88  Pulse 87  Temp(Src) 98.4 F (36.9 C) (Oral)  Resp 19  Ht 5\' 7"  (1.702 m)  Wt 154 lb 9.6 oz (70.126 kg)  BMI 24.21 kg/m2  SpO2 98% Gen: middle aged white male  examined in bed Cardiovascular: RRR Respiratory: no rales, rhonchi or wheezes Gastrointestinal:soft, NT, +BS Extremities:1+ bilateral LE edema    Hospital Course:  Active Problems:   * No active hospital problems. [name] man  with a history of well-differentiated lymphocytic lymphoma/ chronic lymphoid leukemia initially diagnosed in 2000, not requiring intervention until 2006; with multiple chromosomal abnormalities; now admitted with uncontrolled diabetes due to steroids (and possibly pancreatic endocrine failure), as well as c difficile colitis, with poorly controlled graft vs host disease.  His treatment history is as follows:  (1) fludarabine/cyclophosphamide/rituximab x5 completed May 2007.  (2) rituximab for 8 doses October 2010, with partial response  (3) Leustatin and ofatumumab weekly x8 July to September 2011 followed by maintenance ofatumumab maintenance ofatumumab every 2 months, with initial response but rising counts September 2012  (4) status-post unrelated donor stem-cell transplant [facility]/24/2012 at [facility]  (a) conditioning regimen consisted of fludarabine + TBI at 200 cGy, followed by rituximab x27;  (b) CMV reactivation x3 (patient CMV positive, donor negative), s/p ganciclovir treatment; 3d reactivation August 2013, s/p gancyclovir, with negative PCR mid-September 2013; last gancyclovir dose 10/06/2012  (c) Chronic GVHD: involving gut and skin, treated with steroids, tacrolimus and MMF. MMF was eventually d/c'd and tacrolimus currently at a dose of 1.5mg  BID  (d) atrial fibrillation: resolved on brief amiodarone regimen  (e) steroid-induced myopathy: improving  (f) hypomagnesemia: improved after d/c gancyclovir  (g) hypogammaglobulinemia: s/p IVIG most recently 01/07/2013.  (h) history of elevated triglycerides (606 on 07/14/2012)  (i) adrenal insufficiency: on prednisone and budesonide  (j) pancytopenia, improved  (5) restaging studies September 2013 including  CT scans, flow cytometry, and bone marrow biopsy, showed no evidence of residual chronic lymphoid leukemia.  (6) recurrent GVHD (skin rash, mouth changes, severe diarrhea and gastric/duodenal/colonic biopsies 11/17/2012 c/w GVHD grade 2) now only remaining sign is bothersome diarrhea; c diff negative x3; continuing current regimen  (7) severe malnutrition -- on VITAL supplement in addition to regular diet; on Marinol for anorexia  (8) testosterone deficiency--on patch  (9) deconditioning: ongoing REHAB  (10) mild dehydration: encouraged increased po fluids; receives IVF support w magnesium every Mon, Th and Sat  (11) steroid-induced osteoporosis with compression fractures: received pamidronate 12/18/2012  (12) nausea: improved on current meds  (13) Positive c.diff, 03/08/2013, on Flagyl 500 mg TID x 10 days  (14) steroid-induced diabetes: we are continui8ng the starlix but added 70/30 insulin before breakfast and supper; we have obtained a new glucometer for patient and he has been extensively instructed in diabetes testing and management; by thetime of discharge his CBGs were better controlled and we will continue to follow closely as outpatient. His diarrhea continues but is improved and he is able to hydrate himself as an outpatient at this time.      Diet:  regular  Activity:  As tolerated  Condition at Discharge:   improved  Signed: Dr. Ruthann Cancer [phone]  03/29/2013, 9:05 AM

## 2013-03-30 ENCOUNTER — Other Ambulatory Visit: Payer: Self-pay | Admitting: Lab

## 2013-03-30 ENCOUNTER — Ambulatory Visit (HOSPITAL_BASED_OUTPATIENT_CLINIC_OR_DEPARTMENT_OTHER): Payer: BC Managed Care – PPO

## 2013-03-30 MED ORDER — HEPARIN SOD (PORK) LOCK FLUSH 100 UNIT/ML IV SOLN
500.0000 [IU] | Freq: Once | INTRAVENOUS | Status: AC
  Administered 2013-03-30: 500 [IU] via INTRAVENOUS
  Filled 2013-03-30: qty 5

## 2013-03-30 MED ORDER — SODIUM CHLORIDE 0.9 % IJ SOLN
10.0000 mL | INTRAMUSCULAR | Status: DC | PRN
  Administered 2013-03-30: 10 mL via INTRAVENOUS
  Filled 2013-03-30: qty 10

## 2013-03-30 MED ORDER — SODIUM CHLORIDE 0.9 % IV SOLN
Freq: Once | INTRAVENOUS | Status: AC
  Administered 2013-03-30: 13:00:00 via INTRAVENOUS
  Filled 2013-03-30: qty 500

## 2013-03-30 NOTE — Patient Instructions (Addendum)
Hypomagnesemia Magnesium is a common ion (mineral) in the body which is needed for metabolism. It is about how the body handles food and other chemical reactions necessary for life. Only about 2% of the magnesium in our body is found in the blood. When this is low, it is called hypomagnesemia. The blood will measure only a tiny amount of the magnesium in our body. When it is low in our blood, it does not mean that the whole body supply is low. The normal serum concentration ranges from 1.8-2.5 mEq/L. When the level gets to be less than 1.0 mEq/L, a number of problems begin to happen.  CAUSES   Receiving intravenous fluids without magnesium replacement.  Loss of magnesium from the bowel by naso-gastric suction.  Loss of magnesium from nausea and vomiting or severe diarrhea. Any of the inflammatory bowel conditions can cause this.  Abuse of alcohol often leads to low serum magnesium.  An inherited form of magnesium loss happens when the kidneys lose magnesium. This is called familial or primary hypomagnesemia.  Some medications such as diuretics also cause the loss of magnesium. SYMPTOMS  These following problems are worse if the changes in magnesium levels come on suddenly.  Tremor.  Confusion.  Muscle weakness.  Over-sensitive to sights and sounds.  Sensitive reflexes.  Depression.  Muscular fibrillations.  Over-reactivity of the nerves.  Irritability.  Psychosis.  Spasms of the hand muscles.  Tetany (where the muscles go into uncontrollable spasms). DIAGNOSIS  This condition can be diagnosed by blood tests. TREATMENT   In emergency, magnesium can be given intravenously (by vein).  If the condition is less worrisome, it can be corrected by diet. High levels of magnesium are found in green leafy vegetables, peas, beans and nuts among other things. It can also be given through medications by mouth.  If it is being caused by medications, changes can be made.  If  alcohol is a problem, help is available if there are difficulties giving it up. Document Released: 09/03/2005 Document Revised: 03/01/2012 Document Reviewed: 07/28/2008 [facility] Patient Information 2013 [place], [place].  Dehydration, Adult Dehydration means your body does not have as much fluid as it needs. Your kidneys, brain, and heart will not work properly without the right amount of fluids and salt.  HOME CARE  Ask your doctor how to replace body fluid losses (rehydrate).  Drink enough fluids to keep your pee (urine) clear or pale yellow.  Drink small amounts of fluids often if you feel sick to your stomach (nauseous) or throw up (vomit).  Eat like you normally do.  Avoid:  Foods or drinks high in sugar.  Bubbly (carbonated) drinks.  Juice.  Very hot or cold fluids.  Drinks with caffeine.  Fatty, greasy foods.  Alcohol.  Tobacco.  Eating too much.  Gelatin desserts.  Wash your hands to avoid spreading germs (bacteria, viruses).  Only take medicine as told by your doctor.  Keep all doctor visits as told. GET HELP RIGHT AWAY IF:   You cannot drink something without throwing up.  You get worse even with treatment.  Your vomit has blood in it or looks greenish.  Your poop (stool) has blood in it or looks black and tarry.  You have not peed in 6 to 8 hours.  You pee a small amount of very dark pee.  You have a fever.  You pass out (faint).  You have belly (abdominal) pain that gets worse or stays in one spot (localizes).  You have  a rash, stiff neck, or bad headache.  You get easily annoyed, sleepy, or are hard to wake up.  You feel weak, dizzy, or very thirsty. MAKE SURE YOU:   Understand these instructions.  Will watch your condition.  Will get help right away if you are not doing well or get worse. Document Released: 10/04/2009 Document Revised: 03/01/2012 Document Reviewed: 07/28/2011 [facility] Patient Information 2013 [place],  [place].

## 2013-04-01 ENCOUNTER — Other Ambulatory Visit: Payer: Self-pay | Admitting: Lab

## 2013-04-01 ENCOUNTER — Other Ambulatory Visit: Payer: Self-pay | Admitting: *Deleted

## 2013-04-01 ENCOUNTER — Ambulatory Visit (HOSPITAL_BASED_OUTPATIENT_CLINIC_OR_DEPARTMENT_OTHER): Payer: BC Managed Care – PPO

## 2013-04-01 MED ORDER — SODIUM CHLORIDE 0.9 % IV SOLN
Freq: Once | INTRAVENOUS | Status: AC
  Administered 2013-04-01: 13:00:00 via INTRAVENOUS
  Filled 2013-04-01: qty 500

## 2013-04-01 NOTE — Patient Instructions (Addendum)
Hypomagnesemia Magnesium is a common ion (mineral) in the body which is needed for metabolism. It is about how the body handles food and other chemical reactions necessary for life. Only about 2% of the magnesium in our body is found in the blood. When this is low, it is called hypomagnesemia. The blood will measure only a tiny amount of the magnesium in our body. When it is low in our blood, it does not mean that the whole body supply is low. The normal serum concentration ranges from 1.8-2.5 mEq/L. When the level gets to be less than 1.0 mEq/L, a number of problems begin to happen.  CAUSES   Receiving intravenous fluids without magnesium replacement.  Loss of magnesium from the bowel by naso-gastric suction.  Loss of magnesium from nausea and vomiting or severe diarrhea. Any of the inflammatory bowel conditions can cause this.  Abuse of alcohol often leads to low serum magnesium.  An inherited form of magnesium loss happens when the kidneys lose magnesium. This is called familial or primary hypomagnesemia.  Some medications such as diuretics also cause the loss of magnesium. SYMPTOMS  These following problems are worse if the changes in magnesium levels come on suddenly.  Tremor.  Confusion.  Muscle weakness.  Over-sensitive to sights and sounds.  Sensitive reflexes.  Depression.  Muscular fibrillations.  Over-reactivity of the nerves.  Irritability.  Psychosis.  Spasms of the hand muscles.  Tetany (where the muscles go into uncontrollable spasms). DIAGNOSIS  This condition can be diagnosed by blood tests. TREATMENT   In emergency, magnesium can be given intravenously (by vein).  If the condition is less worrisome, it can be corrected by diet. High levels of magnesium are found in green leafy vegetables, peas, beans and nuts among other things. It can also be given through medications by mouth.  If it is being caused by medications, changes can be made.  If  alcohol is a problem, help is available if there are difficulties giving it up. Document Released: 09/03/2005 Document Revised: 03/01/2012 Document Reviewed: 07/28/2008 Exit[employer] Patient Information 2013 [employer].  

## 2013-04-04 ENCOUNTER — Other Ambulatory Visit (HOSPITAL_BASED_OUTPATIENT_CLINIC_OR_DEPARTMENT_OTHER): Payer: BC Managed Care – PPO

## 2013-04-04 ENCOUNTER — Telehealth: Payer: Self-pay | Admitting: *Deleted

## 2013-04-04 ENCOUNTER — Encounter: Payer: Self-pay | Admitting: Physician Assistant

## 2013-04-04 ENCOUNTER — Telehealth: Payer: Self-pay | Admitting: Oncology

## 2013-04-04 ENCOUNTER — Ambulatory Visit (HOSPITAL_BASED_OUTPATIENT_CLINIC_OR_DEPARTMENT_OTHER): Payer: BC Managed Care – PPO | Admitting: Physician Assistant

## 2013-04-04 ENCOUNTER — Ambulatory Visit (HOSPITAL_BASED_OUTPATIENT_CLINIC_OR_DEPARTMENT_OTHER): Payer: BC Managed Care – PPO

## 2013-04-04 DIAGNOSIS — R21 Rash and other nonspecific skin eruption: Secondary | ICD-10-CM

## 2013-04-04 DIAGNOSIS — E860 Dehydration: Secondary | ICD-10-CM

## 2013-04-04 DIAGNOSIS — D61818 Other pancytopenia: Secondary | ICD-10-CM

## 2013-04-04 DIAGNOSIS — C9110 Chronic lymphocytic leukemia of B-cell type not having achieved remission: Secondary | ICD-10-CM

## 2013-04-04 DIAGNOSIS — R197 Diarrhea, unspecified: Secondary | ICD-10-CM

## 2013-04-04 DIAGNOSIS — I4891 Unspecified atrial fibrillation: Secondary | ICD-10-CM

## 2013-04-04 DIAGNOSIS — E139 Other specified diabetes mellitus without complications: Secondary | ICD-10-CM

## 2013-04-04 DIAGNOSIS — D89811 Chronic graft-versus-host disease: Secondary | ICD-10-CM

## 2013-04-04 DIAGNOSIS — R894 Abnormal immunological findings in specimens from other organs, systems and tissues: Secondary | ICD-10-CM

## 2013-04-04 DIAGNOSIS — R768 Other specified abnormal immunological findings in serum: Secondary | ICD-10-CM

## 2013-04-04 DIAGNOSIS — K449 Diaphragmatic hernia without obstruction or gangrene: Secondary | ICD-10-CM

## 2013-04-04 DIAGNOSIS — R112 Nausea with vomiting, unspecified: Secondary | ICD-10-CM

## 2013-04-04 LAB — CBC WITH DIFFERENTIAL/PLATELET
BASO%: 0.1 % (ref 0.0–2.0)
EOS%: 0.1 % (ref 0.0–7.0)
MCH: 30.9 pg (ref 27.2–33.4)
MCHC: 33.1 g/dL (ref 32.0–36.0)
MCV: 93.3 fL (ref 79.3–98.0)
MONO%: 5.3 % (ref 0.0–14.0)
NEUT#: 5 10*3/uL (ref 1.5–6.5)
RBC: 3.43 10*6/uL — ABNORMAL LOW (ref 4.20–5.82)
RDW: 16.7 % — ABNORMAL HIGH (ref 11.0–14.6)
nRBC: 0 % (ref 0–0)

## 2013-04-04 LAB — COMPREHENSIVE METABOLIC PANEL (CC13)
ALT: 33 U/L (ref 0–55)
Alkaline Phosphatase: 95 U/L (ref 40–150)
Creatinine: 1 mg/dL (ref 0.7–1.3)
Sodium: 134 mEq/L — ABNORMAL LOW (ref 136–145)
Total Bilirubin: 0.27 mg/dL (ref 0.20–1.20)
Total Protein: 4.6 g/dL — ABNORMAL LOW (ref 6.4–8.3)

## 2013-04-04 MED ORDER — SODIUM CHLORIDE 0.9 % IJ SOLN
10.0000 mL | INTRAMUSCULAR | Status: DC | PRN
  Administered 2013-04-04: 10 mL via INTRAVENOUS
  Filled 2013-04-04: qty 10

## 2013-04-04 MED ORDER — HEPARIN SOD (PORK) LOCK FLUSH 100 UNIT/ML IV SOLN
500.0000 [IU] | Freq: Once | INTRAVENOUS | Status: AC
  Administered 2013-04-04: 500 [IU] via INTRAVENOUS
  Filled 2013-04-04: qty 5

## 2013-04-04 MED ORDER — SODIUM CHLORIDE 0.9 % IV SOLN
Freq: Once | INTRAVENOUS | Status: AC
  Administered 2013-04-04: 10:00:00 via INTRAVENOUS
  Filled 2013-04-04: qty 500

## 2013-04-04 NOTE — Telephone Encounter (Signed)
, °

## 2013-04-04 NOTE — Progress Notes (Signed)
ID: Timothy [name]   DOB: 20-Jul-1946  MR#: [id number]  WUJ#:[id number]  NFA:OZHY,Q DOUGLAS, MD SU: OTHER MD: Donzetta Starch  HISTORY OF PRESENT ILLNESS: We have very complete records from Dr. Sydnee Levans in [place], and in summary:  The patient was initially diagnosed in August 2000, with a white cell count of 23,600, but normal hemoglobin and platelets, and no significant symptomatology. Over the next several years his white cell count drifted up, and he eventually developed some symptoms of night sweats in particular, leading to treatment with fludarabine, Cytoxan and rituxan for five cycles given between December 2006 and May 2007.  We have CT scans from June 2006, November 2006 and April 2007, and comparing the November 2006 and April 2007 scans, there was near complete response. He had subsequent therapy in [place] as detailed below, but with decreased response, leading to allogeneic stem-cell transplant at the [facility] 02/24/2012.  INTERVAL HISTORY: Timothy [name] returns with his wife [name]  for followup of his CLL, status post allogeneic stem cell transplant in [place] March 2013. He is seen in the infusion room today for evaluation while receiving supportive IV fluids.  He has completed over 2 weeks of therapy of Flagyl for C. difficile colitis.  Dr. Darnelle Catalan reviewed Timothy [name]'s case with Dr. Maurice March in Infectious Disease.  Of course the difficult question to answer is whether or not his excessive diarrhea was due only to C. difficile, or perhaps associated with his graft versus host disease.  Per Dr. Bonnetta Barry suggestion, we discontinued the metronidazole last week on 03/31/2013. Timothy [name] is diarrhea has actually improved slightly. He is averaging 3-4 loose "pudding-like" bowel movements daily. On average, he has too loose bowel movements in the morning, then 2 late at night but "does okay" during the day.  He is also keeping track of his blood sugars which are better controlled overall,  but are still elevated, often over 300 before lunch and dinner. He is currently on insulin at 16 units in the morning and 8 units before supper.    REVIEW OF SYSTEMS: Timothy [name] has had no fevers or chills. He has noted no new rashes or skin changes. His appetite is fair. She's having no significant problems with nausea or emesis. He denies any new cough. He has shortness of breath with exertion. He's had no chest pain or palpitations. He denies any abnormal headaches or dizziness. He continues to have some blurred vision for which she will be seeing Dr.  Charlotte Sanes for evaluation. He currently denies any unusual myalgias, arthralgias, bony pain, or excessive swelling.  A detailed review of systems is otherwise stable and noncontributory.  PAST MEDICAL HISTORY: Past Medical History  Diagnosis Date  . 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 Diverticular disease   . Hyperlipidemia   . Obesity   . Hypertension   . Hiatal hernia   . CMV (cytomegalovirus) antibody positive     pre-transplant, with seroconversion x2 pst-transplant  . Right bundle branch block     pre-transplant  . CKD (chronic kidney disease) stage 2, GFR 60-89 ml/min   . Pancytopenia   . Steroid-induced diabetes   . Atrial fibrillation     post-transplant  . Myopathy   . 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 Leukemia, chronic lymphoid   . 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 Diarrhea in adult patient 12/05/2012    Due to active GVHD  .  CLL (chronic lymphocytic leukemia) 12/05/2012    Dx 07/1999; started Rx 12/06  AlloBMT 3/13  . Rash of face 12/05/2012    Due to GVHD  . Hypomagnesemia 01/26/2013    PAST SURGICAL HISTORY: Past Surgical History  Procedure Laterality Date  . Tonsillectomy and adenoidectomy    . Bone marrow transplant    . Flexible sigmoidoscopy  11/17/2012    Procedure: FLEXIBLE SIGMOIDOSCOPY;   Surgeon: Petra Kuba, MD;  Location: [facility] ENDOSCOPY;  Service: Endoscopy;  Laterality: N/A;  Dr Ewing Schlein states will be admitted to rooom 1339 11/16/12  . Esophagogastroduodenoscopy  11/17/2012    Procedure: ESOPHAGOGASTRODUODENOSCOPY (EGD);  Surgeon: Petra Kuba, MD;  Location: [facility];  Service: Endoscopy;  Laterality: N/A;    FAMILY HISTORY Family History  Problem Relation Age of Onset  . Cancer Father    The patient's father died from complications of chronic lymphocytic leukemia at the age of 33.  It had been diagnosed seven years before when he was 53.  The patient is enrolled in a familial chronic lymphocytic leukemia study out of the [employer] [facility] [employer].  The patient's mother is 61, alive, unfortunately suffering with dementia, and he has a brother, 61, who is otherwise in fair health.   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  His wife of >40 years, [name], is a homemaker.  Their daughter, [name], lives in [place].  She also is a Futures trader.  The patient has an 38 year old grandson and an 28-year-old granddaughter, and that is really the main reason he moved to this area.  He is a International aid/development worker.     ADVANCED DIRECTIVES: In place  HEALTH MAINTENANCE: History  Substance Use Topics  . Smoking status: Never Smoker   . Smokeless tobacco: Never Used  . Alcohol Use: No     Colonoscopy:  PSA:  Bone density:  Lipid panel:  Allergies  Allergen Reactions  . Benadryl (Diphenhydramine Hcl)     "Restless leg syndrome"    Current Outpatient Prescriptions  Medication Sig Dispense Refill  . acyclovir (ZOVIRAX) 400 MG tablet Take 2 tablets (800 mg total) by mouth 2 (two) times daily.  60 tablet  12  . antiseptic oral rinse (BIOTENE) LIQD 15 mLs by Mouth Rinse route 2 times daily at 12 noon and 4 pm.  237 mL  6  . B-D UF III MINI PEN NEEDLES 31G X 5 MM MISC        . Blood Glucose Monitoring Suppl (ONE TOUCH ULTRA 2) W/DEVICE KIT       . budesonide (ENTOCORT EC) 3 MG 24 hr capsule Take 9 mg by mouth 3 (three) times daily.      . Calcium Citrate (CITRACAL PO) Take 1,200 mg by mouth 2 (two) times daily.      . cholecalciferol (VITAMIN D) 1000 UNITS tablet Take 1,000 Units by mouth every evening.       . cholestyramine (QUESTRAN) 4 G packet Take 1 packet by mouth 2 (two) times daily with a meal.  60 each  1  . diphenoxylate-atropine (LOMOTIL) 2.5-0.025 MG per tablet       . dronabinol (MARINOL) 2.5 MG capsule Take 1 capsule (2.5 mg total) by mouth 2 (two) times daily before lunch and supper.  60 capsule  0  . fluconazole (DIFLUCAN) 100 MG tablet Take 1 tablet (100 mg total) by mouth every morning.  30 tablet  12  . HYDROcodone-acetaminophen (NORCO) 10-325 MG per tablet Take 1 tablet by mouth daily as needed for pain (may take 1/2 to 1 tablet in the am as needed for pain).      Marland Kitchen HYDROcodone-acetaminophen (NORCO) 10-325 MG per tablet Take 0.5 tablets by mouth daily as needed.  30 tablet  0  . insulin aspart protamine-insulin aspart (NOVOLOG 70/30) (70-30) 100 UNIT/ML injection Inject 18 Units into the skin daily with breakfast.      . insulin aspart protamine-insulin aspart (NOVOLOG 70/30) (70-30) 100 UNIT/ML injection Inject 9 Units into the skin daily with supper.      . labetalol (NORMODYNE) 200 MG tablet Take 1 tablet (200 mg total) by mouth 2 (two) times daily.  60 tablet  12  . Lidocaine-Hydrocortisone Ace 3-0.5 % KIT       . metroNIDAZOLE (FLAGYL) 500 MG tablet Take 1 tablet (500 mg total) by mouth 3 (three) times daily.  21 tablet  2  . Multiple Vitamins-Minerals (CENTRUM SILVER ADULT 50+) TABS Take 1 tablet by mouth every evening.       . mupirocin ointment (BACTROBAN) 2 %       . nateglinide (STARLIX) 120 MG tablet Take 1 tablet (120 mg total) by mouth 3 (three) times daily before meals.  90 tablet  11  . Nutritional Supplements (FEEDING  SUPPLEMENT, VITAL 1.5 CAL,) LIQD Take 237 mLs by mouth 2 (two) times daily.  24 Can  12  . omeprazole (PRILOSEC) 20 MG capsule Take 20 mg by mouth daily.      . ONE TOUCH ULTRA TEST test strip       . ONETOUCH DELICA LANCETS 33G MISC       . predniSONE (DELTASONE) 10 MG tablet Take 1 tablet (10 mg total) by mouth daily.  30 tablet  2  . predniSONE (DELTASONE) 20 MG tablet Take 50 mg by mouth daily with breakfast.      . predniSONE (DELTASONE) 20 MG tablet Take 2 tablets (40 mg total) by mouth daily.  60 tablet  2  . sertraline (ZOLOFT) 50 MG tablet Takes 50 mg one day and 100 mg the next day  90 tablet  3  . Sodium Chloride Flush (NORMAL SALINE FLUSH) 0.9 % SOLN       . sulfamethoxazole-trimethoprim (BACTRIM DS) 800-160 MG per tablet Take 1 tablet by mouth daily.  30 tablet  11  . tacrolimus (PROGRAF) 0.5 MG capsule Take 1.5 mg by mouth 2 (two) times daily.      Marland Kitchen testosterone (ANDRODERM) 4 MG/24HR PT24 patch Place 1 patch onto the skin daily.  30 patch  3  . zinc gluconate 50 MG tablet Take 50 mg by mouth daily.       No current facility-administered medications for this visit.   Facility-Administered Medications Ordered in Other Visits  Medication Dose Route Frequency Provider Last Rate Last Dose  . 0.9 %  sodium chloride infusion   Intravenous Continuous Misha Antonini G Yeny Schmoll, PA-C 500 mL/hr at 03/12/13 0900    . sodium chloride 0.9 % injection 10 mL  10 mL Intravenous PRN Lowella Dell, MD   10 mL at 08/11/12 1606    OBJECTIVE: Middle-aged white male in no acute distress, examined in the infusion room while receiving IV fluids. There were no vitals filed for this visit.   There is no weight on file to calculate BMI.   There were no vitals filed for this visit. ECOG FS: 2  Vitals are recorded separately in the chart and have been reviewed.  Face shows no rash Sclerae unicteric Oropharynx clear Lungs no rales or rhonchi Heart regular rate and rhythm Abd soft, nontender, positive bowel  sounds Minimal bilateral lower extremity  edema Neuro: well oriented, positive affect    LAB RESULTS:  CBC    Component Value Date/Time   WBC 7.0 04/04/2013 0852   WBC 5.4 03/18/2013 0615   RBC 3.43* 04/04/2013 0852   RBC 3.17* 03/18/2013 0615   HGB 10.6* 04/04/2013 0852   HGB 9.9* 03/18/2013 0615   HCT 32.0* 04/04/2013 0852   HCT 28.8* 03/18/2013 0615   PLT 114* 04/04/2013 0852   PLT 92* 03/18/2013 0615   MCV 93.3 04/04/2013 0852   MCV 90.9 03/18/2013 0615   MCH 30.9 04/04/2013 0852   MCH 31.2 03/18/2013 0615   MCHC 33.1 04/04/2013 0852   MCHC 34.4 03/18/2013 0615   RDW 16.7* 04/04/2013 0852   RDW 14.7 03/18/2013 0615   LYMPHSABS 1.7 04/04/2013 0852   LYMPHSABS 1.4 03/18/2013 0615   MONOABS 0.4 04/04/2013 0852   MONOABS 0.3 03/18/2013 0615   EOSABS 0.0 04/04/2013 0852   EOSABS 0.0 03/18/2013 0615   BASOSABS 0.0 04/04/2013 0852   BASOSABS 0.0 03/18/2013 0615        Chemistry      Component Value Date/Time   NA 134* 04/04/2013 0854   NA 135 03/18/2013 0615   K 4.4 04/04/2013 0854   K 5.2* 03/18/2013 0615   CL 109* 04/04/2013 0854   CL 113* 03/18/2013 0615   CO2 18* 04/04/2013 0854   CO2 13* 03/18/2013 0615   BUN 44.5* 04/04/2013 0854   BUN 44* 03/18/2013 0615   CREATININE 1.0 04/04/2013 0854   CREATININE 0.91 03/18/2013 0615      Component Value Date/Time   CALCIUM 8.5 04/04/2013 0854   CALCIUM 7.9* 03/18/2013 0615   ALKPHOS 95 04/04/2013 0854   ALKPHOS 73 03/18/2013 0615   AST 15 04/04/2013 0854   AST 11 03/18/2013 0615   ALT 33 04/04/2013 0854   ALT 24 03/18/2013 0615   BILITOT 0.27 04/04/2013 0854   BILITOT 0.2* 03/18/2013 0615      STUDIES:  No results found.    ASSESSMENT: 67 y.o. Timothy [name] man with a history of well-differentiated lymphocytic lymphoma/ chronic lymphoid leukemia initially diagnosed in 2000, not requiring intervention until 2006; with multiple chromosomal abnormalities.  His treatment history is as follows:  (1) fludarabine/cyclophosphamide/rituximab x5 completed  May 2007.   (2) rituximab for 8 doses October 2010, with partial response   (3) Leustatin and ofatumumab weekly x8 July to September 2011 followed by maintenance ofatumumab maintenance ofatumumab every 2 months, with initial response but rising counts September 2012   (4) status-post unrelated donor stem-cell transplant 02/24/2012 at [facility]  (a) conditioning regimen consisted of fludarabine + TBI at 200 cGy, followed by rituximab x27;  (b) CMV reactivation x3 (patient CMV positive, donor negative), s/p ganciclovir treatment; 3d reactivation August 2013, s/p gancyclovir, with negative PCR mid-September 2013; last gancyclovir dose 10/06/2012 (c) Chronic GVHD: involving gut and skin, treated with steroids, tacrolimus and MMF.  MMF was eventually d/c'd and tacrolimus currently at a dose of 1.5mg  BID (d) atrial fibrillation: resolved on brief amiodarone regimen (e) steroid-induced myopathy: improving  (f) hypomagnesemia: improved after d/c gancyclovir (g) hypogammaglobulinemia: s/p IVIG most recently 01/07/2013. (h) history of elevated triglycerides (606 on 07/14/2012)  (i) adrenal insufficiency: on prednisone and budesonide (j) pancytopenia, improved  (5)  restaging studies September 2013  including CT scans, flow cytometry, and bone marrow biopsy, showed no evidence of residual chronic lymphoid leukemia.  (6) recurrent GVHD (skin rash, mouth changes, severe diarrhea and gastric/duodenal/colonic biopsies 11/17/2012 c/w GVHD grade 2) now only remaining sign is bothersome diarrhea; c diff negative x3; continuing current regimen   (7) severe malnutrition -- on VITAL supplement in addition to regular diet; on Marinol for anorexia  (8) testosterone deficiency--on patch   (9) deconditioning: ongoing REHAB   (10) mild dehydration: encouraged increased po fluids; receives IVF support w magnesium every Mon, Th and Sat  (11) steroid-induced osteoporosis with compression fractures: received  pamidronate 12/18/2012   (12) nausea: improved on current meds  (13)  Positive c.diff, 03/08/2013, on Flagyl 500 mg TID x 20 days; repeat C. difficile pending  (14) hyperkalemia  (15) steroid induced hyperglycemia, on sterlix and 70/30 insulin  PLAN: Timothy [name] will continue receiving IV fluids 3 times weekly since she is still finding a very difficult to keep himself well hydrated. Of course we'll continue to follow his labs very closely. He'll continue to check his glucose at home. In the meanwhile, Dr. Darnelle Catalan has suggested increasing his insulin to 18 units in the morning and 9 units before supper.  He will also keep track of his bowel movements, and let us know if they increase significantly. If they do, our next step (Per ID receommendation) would likely be to treat with oral vanco in place of metronidazole.  For financial reasons, Ansh would like to see [name] a little less frequently. Although we will continue to have him come in 3 times weekly for labs and IV fluids, we will only see him for her visits every other week, but he and [name] both know to call if he has any changes or problems.   Zollie Scale    04/04/2013

## 2013-04-04 NOTE — Patient Instructions (Addendum)
Dehydration, Adult Dehydration means your body does not have as much fluid as it needs. Your kidneys, brain, and heart will not work properly without the right amount of fluids and salt.  HOME CARE  Ask your doctor how to replace body fluid losses (rehydrate).  Drink enough fluids to keep your pee (urine) clear or pale yellow.  Drink small amounts of fluids often if you feel sick to your stomach (nauseous) or throw up (vomit).  Eat like you normally do.  Avoid:  Foods or drinks high in sugar.  Bubbly (carbonated) drinks.  Juice.  Very hot or cold fluids.  Drinks with caffeine.  Fatty, greasy foods.  Alcohol.  Tobacco.  Eating too much.  Gelatin desserts.  Wash your hands to avoid spreading germs (bacteria, viruses).  Only take medicine as told by your doctor.  Keep all doctor visits as told. GET HELP RIGHT AWAY IF:   You cannot drink something without throwing up.  You get worse even with treatment.  Your vomit has blood in it or looks greenish.  Your poop (stool) has blood in it or looks black and tarry.  You have not peed in 6 to 8 hours.  You pee a small amount of very dark pee.  You have a fever.  You pass out (faint).  You have belly (abdominal) pain that gets worse or stays in one spot (localizes).  You have a rash, stiff neck, or bad headache.  You get easily annoyed, sleepy, or are hard to wake up.  You feel weak, dizzy, or very thirsty. MAKE SURE YOU:   Understand these instructions.  Will watch your condition.  Will get help right away if you are not doing well or get worse. Document Released: 10/04/2009 Document Revised: 03/01/2012 Document Reviewed: 07/28/2011 [employer] Patient Information 2013 [employer].  

## 2013-04-04 NOTE — Telephone Encounter (Signed)
Per scheduler I have adjusted 4/30 appts.  JMW

## 2013-04-05 ENCOUNTER — Other Ambulatory Visit: Payer: Self-pay | Admitting: *Deleted

## 2013-04-05 ENCOUNTER — Other Ambulatory Visit: Payer: Self-pay | Admitting: Oncology

## 2013-04-05 MED ORDER — VANCOMYCIN HCL 125 MG PO CAPS: 125.0000 mg | ORAL_CAPSULE | Freq: Four times a day (QID) | ORAL | Status: DC

## 2013-04-05 NOTE — Telephone Encounter (Signed)
Pt called to this RN stating onset of increased diarrhea since MN- ( increase of 4 stools ). Onset of BM is more urgent with 1 episode of incontinence. Noted lighter in color and " different smell ".  Pt is able to hydrate with a least 1 liter of fluid ( " probably more but not 2 liters"). Romin is not taking any anti-diarrhea medication.  Per review with MD recommended: Start oral vancomycin Resume use of anti diarrhea medications Verify pt will aggressively hydrate today or can come in for IVF.  This RN called prescription to [facility] outpt phx and spoke with Arlys John- he will have medication made per inpt phx and available for pick up late today or tomorrow.  This RN called and discussed all the above with Aurther Loft. Including inquiring need for IVF today. Terri declined IVF and will push fluids at home. He has both [place] and lomotil in the home and will use. He is scheduled for visit tomorrow- no other needs at this time.

## 2013-04-06 ENCOUNTER — Ambulatory Visit (HOSPITAL_BASED_OUTPATIENT_CLINIC_OR_DEPARTMENT_OTHER): Payer: BC Managed Care – PPO

## 2013-04-06 VITALS — BP 147/81 | HR 70 | Temp 98.6°F | Resp 18

## 2013-04-06 DIAGNOSIS — C9110 Chronic lymphocytic leukemia of B-cell type not having achieved remission: Secondary | ICD-10-CM

## 2013-04-06 MED ORDER — SODIUM CHLORIDE 0.9 % IV SOLN
INTRAVENOUS | Status: DC
  Administered 2013-04-06: 15:00:00 via INTRAVENOUS

## 2013-04-06 MED ORDER — SODIUM CHLORIDE 0.9 % IV SOLN
Freq: Once | INTRAVENOUS | Status: AC
  Administered 2013-04-06: 16:00:00 via INTRAVENOUS
  Filled 2013-04-06: qty 500

## 2013-04-06 NOTE — Patient Instructions (Addendum)
Patient aware of next appointment; discharged home with no complaint.

## 2013-04-08 ENCOUNTER — Ambulatory Visit (HOSPITAL_BASED_OUTPATIENT_CLINIC_OR_DEPARTMENT_OTHER): Payer: BC Managed Care – PPO

## 2013-04-08 ENCOUNTER — Telehealth: Payer: Self-pay | Admitting: *Deleted

## 2013-04-08 MED ORDER — SODIUM CHLORIDE 0.9 % IV SOLN
Freq: Once | INTRAVENOUS | Status: AC
  Administered 2013-04-08: 15:00:00 via INTRAVENOUS
  Filled 2013-04-08: qty 500

## 2013-04-08 NOTE — Telephone Encounter (Signed)
I  Have moved appt on 4/25 to later in the morning sue to a meeting.  JMW

## 2013-04-08 NOTE — Patient Instructions (Addendum)
Hypomagnesemia Magnesium is a common ion (mineral) in the body which is needed for metabolism. It is about how the body handles food and other chemical reactions necessary for life. Only about 2% of the magnesium in our body is found in the blood. When this is low, it is called hypomagnesemia. The blood will measure only a tiny amount of the magnesium in our body. When it is low in our blood, it does not mean that the whole body supply is low. The normal serum concentration ranges from 1.8-2.5 mEq/L. When the level gets to be less than 1.0 mEq/L, a number of problems begin to happen.  CAUSES   Receiving intravenous fluids without magnesium replacement.  Loss of magnesium from the bowel by naso-gastric suction.  Loss of magnesium from nausea and vomiting or severe diarrhea. Any of the inflammatory bowel conditions can cause this.  Abuse of alcohol often leads to low serum magnesium.  An inherited form of magnesium loss happens when the kidneys lose magnesium. This is called familial or primary hypomagnesemia.  Some medications such as diuretics also cause the loss of magnesium. SYMPTOMS  These following problems are worse if the changes in magnesium levels come on suddenly.  Tremor.  Confusion.  Muscle weakness.  Over-sensitive to sights and sounds.  Sensitive reflexes.  Depression.  Muscular fibrillations.  Over-reactivity of the nerves.  Irritability.  Psychosis.  Spasms of the hand muscles.  Tetany (where the muscles go into uncontrollable spasms). DIAGNOSIS  This condition can be diagnosed by blood tests. TREATMENT   In emergency, magnesium can be given intravenously (by vein).  If the condition is less worrisome, it can be corrected by diet. High levels of magnesium are found in green leafy vegetables, peas, beans and nuts among other things. It can also be given through medications by mouth.  If it is being caused by medications, changes can be made.  If  alcohol is a problem, help is available if there are difficulties giving it up. Document Released: 09/03/2005 Document Revised: 03/01/2012 Document Reviewed: 07/28/2008 [facility] Patient Information 2013 [place], [place].

## 2013-04-11 ENCOUNTER — Other Ambulatory Visit: Payer: Self-pay | Admitting: *Deleted

## 2013-04-11 ENCOUNTER — Ambulatory Visit: Payer: Self-pay | Admitting: Family

## 2013-04-11 ENCOUNTER — Other Ambulatory Visit: Payer: Self-pay | Admitting: Oncology

## 2013-04-11 ENCOUNTER — Ambulatory Visit (HOSPITAL_BASED_OUTPATIENT_CLINIC_OR_DEPARTMENT_OTHER): Payer: BC Managed Care – PPO

## 2013-04-11 ENCOUNTER — Other Ambulatory Visit (HOSPITAL_BASED_OUTPATIENT_CLINIC_OR_DEPARTMENT_OTHER): Payer: BC Managed Care – PPO

## 2013-04-11 ENCOUNTER — Other Ambulatory Visit: Payer: Self-pay | Admitting: Lab

## 2013-04-11 ENCOUNTER — Encounter: Payer: Self-pay | Admitting: Oncology

## 2013-04-11 VITALS — BP 118/76 | HR 75 | Temp 97.5°F

## 2013-04-11 DIAGNOSIS — C9110 Chronic lymphocytic leukemia of B-cell type not having achieved remission: Secondary | ICD-10-CM

## 2013-04-11 DIAGNOSIS — R768 Other specified abnormal immunological findings in serum: Secondary | ICD-10-CM

## 2013-04-11 DIAGNOSIS — D809 Immunodeficiency with predominantly antibody defects, unspecified: Secondary | ICD-10-CM

## 2013-04-11 DIAGNOSIS — D89811 Chronic graft-versus-host disease: Secondary | ICD-10-CM

## 2013-04-11 DIAGNOSIS — R894 Abnormal immunological findings in specimens from other organs, systems and tissues: Secondary | ICD-10-CM

## 2013-04-11 DIAGNOSIS — Z9489 Other transplanted organ and tissue status: Secondary | ICD-10-CM

## 2013-04-11 DIAGNOSIS — R5381 Other malaise: Secondary | ICD-10-CM

## 2013-04-11 LAB — COMPREHENSIVE METABOLIC PANEL (CC13)
ALT: 36 U/L (ref 0–55)
Albumin: 2.6 g/dL — ABNORMAL LOW (ref 3.5–5.0)
Alkaline Phosphatase: 83 U/L (ref 40–150)
CO2: 18 mEq/L — ABNORMAL LOW (ref 22–29)
Potassium: 5.7 mEq/L — ABNORMAL HIGH (ref 3.5–5.1)
Sodium: 134 mEq/L — ABNORMAL LOW (ref 136–145)
Total Bilirubin: 0.3 mg/dL (ref 0.20–1.20)
Total Protein: 4.8 g/dL — ABNORMAL LOW (ref 6.4–8.3)

## 2013-04-11 LAB — CBC WITH DIFFERENTIAL/PLATELET
Basophils Absolute: 0 10*3/uL (ref 0.0–0.1)
EOS%: 0 % (ref 0.0–7.0)
Eosinophils Absolute: 0 10*3/uL (ref 0.0–0.5)
HCT: 32.7 % — ABNORMAL LOW (ref 38.4–49.9)
HGB: 10.9 g/dL — ABNORMAL LOW (ref 13.0–17.1)
MCH: 31.3 pg (ref 27.2–33.4)
MCV: 94 fL (ref 79.3–98.0)
MONO%: 7.3 % (ref 0.0–14.0)
NEUT#: 2.3 10*3/uL (ref 1.5–6.5)
NEUT%: 54.1 % (ref 39.0–75.0)
lymph#: 1.7 10*3/uL (ref 0.9–3.3)

## 2013-04-11 MED ORDER — SODIUM CHLORIDE 0.9 % IV SOLN
1.0000 g | Freq: Once | INTRAVENOUS | Status: AC
  Administered 2013-04-11: 1 g via INTRAVENOUS
  Filled 2013-04-11: qty 2

## 2013-04-11 MED ORDER — SODIUM CHLORIDE 0.9 % IV SOLN: INTRAVENOUS | Status: DC

## 2013-04-11 NOTE — Patient Instructions (Addendum)
[facility] Discharge Instructions for Patients Receiving Chemotherapy  Today you received the following IVF's with 1 gram Magnesium.  BELOW ARE SYMPTOMS THAT SHOULD BE REPORTED IMMEDIATELY:  *FEVER GREATER THAN 100.5 F  *CHILLS WITH OR WITHOUT FEVER  NAUSEA AND VOMITING THAT IS NOT CONTROLLED WITH YOUR NAUSEA MEDICATION  *UNUSUAL SHORTNESS OF BREATH  *UNUSUAL BRUISING OR BLEEDING  TENDERNESS IN MOUTH AND THROAT WITH OR WITHOUT PRESENCE OF ULCERS  *URINARY PROBLEMS  *BOWEL PROBLEMS  UNUSUAL RASH Items with * indicate a potential emergency and should be followed up as soon as possible.  Feel free to call the clinic you have any questions or concerns. The clinic phone number is [phone].   I have been informed and understand all the instructions given to me. I know to contact the clinic, my physician, or go to the Emergency Department if any problems should occur. I do not have any questions at this time, but understand that I may call the clinic during office hours   should I have any questions or need assistance in obtaining follow up care.    __________________________________________  _____________  __________ Signature of Patient or Authorized Representative            Date                   Time    __________________________________________ Nurse's Signature

## 2013-04-11 NOTE — Progress Notes (Signed)
Called daily for an update. [name] tells me he sugars are now much better, were 137 this morning, and there are usually in the 172 200 range of before supper. The diarrhea was very difficult last night, with a lot of volume and a lot of staying up, but now has slowed down. He is taking the vancomycin 4 times a day and tolerating that well. I didn't make any changes in his treatment. He will continue to receive his fluids here 3 times a week and we will continue to check lab work once a week. Next is it will be April 30.

## 2013-04-12 ENCOUNTER — Other Ambulatory Visit: Payer: Self-pay | Admitting: *Deleted

## 2013-04-12 DIAGNOSIS — C9110 Chronic lymphocytic leukemia of B-cell type not having achieved remission: Secondary | ICD-10-CM

## 2013-04-12 DIAGNOSIS — D89811 Chronic graft-versus-host disease: Secondary | ICD-10-CM

## 2013-04-12 MED ORDER — DRONABINOL 2.5 MG PO CAPS: 2.5000 mg | ORAL_CAPSULE | Freq: Two times a day (BID) | ORAL | Status: DC

## 2013-04-12 MED ORDER — VANCOMYCIN HCL 125 MG PO CAPS: 125.0000 mg | ORAL_CAPSULE | Freq: Four times a day (QID) | ORAL | Status: DC

## 2013-04-13 ENCOUNTER — Other Ambulatory Visit: Payer: Self-pay | Admitting: *Deleted

## 2013-04-13 ENCOUNTER — Ambulatory Visit (HOSPITAL_BASED_OUTPATIENT_CLINIC_OR_DEPARTMENT_OTHER): Payer: BC Managed Care – PPO

## 2013-04-13 ENCOUNTER — Other Ambulatory Visit (HOSPITAL_BASED_OUTPATIENT_CLINIC_OR_DEPARTMENT_OTHER): Payer: BC Managed Care – PPO | Admitting: Lab

## 2013-04-13 VITALS — BP 143/89 | HR 73 | Temp 98.0°F | Resp 18

## 2013-04-13 DIAGNOSIS — A0472 Enterocolitis due to Clostridium difficile, not specified as recurrent: Secondary | ICD-10-CM

## 2013-04-13 DIAGNOSIS — E860 Dehydration: Secondary | ICD-10-CM

## 2013-04-13 DIAGNOSIS — C9110 Chronic lymphocytic leukemia of B-cell type not having achieved remission: Secondary | ICD-10-CM

## 2013-04-13 DIAGNOSIS — Z9489 Other transplanted organ and tissue status: Secondary | ICD-10-CM

## 2013-04-13 DIAGNOSIS — R768 Other specified abnormal immunological findings in serum: Secondary | ICD-10-CM

## 2013-04-13 DIAGNOSIS — R894 Abnormal immunological findings in specimens from other organs, systems and tissues: Secondary | ICD-10-CM

## 2013-04-13 DIAGNOSIS — E1110 Type 2 diabetes mellitus with ketoacidosis without coma: Secondary | ICD-10-CM

## 2013-04-13 DIAGNOSIS — D89811 Chronic graft-versus-host disease: Secondary | ICD-10-CM

## 2013-04-13 LAB — BASIC METABOLIC PANEL (CC13)
CO2: 18 mEq/L — ABNORMAL LOW (ref 22–29)
Calcium: 8.8 mg/dL (ref 8.4–10.4)
Creatinine: 1 mg/dL (ref 0.7–1.3)
Glucose: 119 mg/dl — ABNORMAL HIGH (ref 70–99)

## 2013-04-13 MED ORDER — ONETOUCH DELICA LANCETS 33G MISC: 33.0000 g | Status: DC

## 2013-04-13 MED ORDER — HEPARIN SOD (PORK) LOCK FLUSH 100 UNIT/ML IV SOLN
500.0000 [IU] | Freq: Once | INTRAVENOUS | Status: AC
  Administered 2013-04-13: 250 [IU] via INTRAVENOUS
  Filled 2013-04-13: qty 5

## 2013-04-13 MED ORDER — SODIUM CHLORIDE 0.9 % IJ SOLN
10.0000 mL | INTRAMUSCULAR | Status: DC | PRN
  Administered 2013-04-13: 10 mL via INTRAVENOUS
  Filled 2013-04-13: qty 10

## 2013-04-13 MED ORDER — GLUCOSE BLOOD VI STRP: ORAL_STRIP | Status: DC

## 2013-04-13 MED ORDER — SODIUM CHLORIDE 0.9 % IV SOLN
1000.0000 mL | INTRAVENOUS | Status: DC
  Administered 2013-04-13: 10:00:00 via INTRAVENOUS

## 2013-04-13 NOTE — Patient Instructions (Addendum)
Dehydration, Adult Dehydration means your body does not have as much fluid as it needs. Your kidneys, brain, and heart will not work properly without the right amount of fluids and salt.  HOME CARE  Ask your doctor how to replace body fluid losses (rehydrate).  Drink enough fluids to keep your pee (urine) clear or pale yellow.  Drink small amounts of fluids often if you feel sick to your stomach (nauseous) or throw up (vomit).  Eat like you normally do.  Avoid:  Foods or drinks high in sugar.  Bubbly (carbonated) drinks.  Juice.  Very hot or cold fluids.  Drinks with caffeine.  Fatty, greasy foods.  Alcohol.  Tobacco.  Eating too much.  Gelatin desserts.  Wash your hands to avoid spreading germs (bacteria, viruses).  Only take medicine as told by your doctor.  Keep all doctor visits as told. GET HELP RIGHT AWAY IF:   You cannot drink something without throwing up.  You get worse even with treatment.  Your vomit has blood in it or looks greenish.  Your poop (stool) has blood in it or looks black and tarry.  You have not peed in 6 to 8 hours.  You pee a small amount of very dark pee.  You have a fever.  You pass out (faint).  You have belly (abdominal) pain that gets worse or stays in one spot (localizes).  You have a rash, stiff neck, or bad headache.  You get easily annoyed, sleepy, or are hard to wake up.  You feel weak, dizzy, or very thirsty. MAKE SURE YOU:   Understand these instructions.  Will watch your condition.  Will get help right away if you are not doing well or get worse. Document Released: 10/04/2009 Document Revised: 03/01/2012 Document Reviewed: 07/28/2011 [employer] Patient Information 2013 [employer].  

## 2013-04-15 ENCOUNTER — Ambulatory Visit (HOSPITAL_BASED_OUTPATIENT_CLINIC_OR_DEPARTMENT_OTHER): Payer: BC Managed Care – PPO

## 2013-04-15 VITALS — BP 146/85 | HR 81 | Temp 97.2°F | Resp 17

## 2013-04-15 DIAGNOSIS — D89811 Chronic graft-versus-host disease: Secondary | ICD-10-CM

## 2013-04-15 DIAGNOSIS — R894 Abnormal immunological findings in specimens from other organs, systems and tissues: Secondary | ICD-10-CM

## 2013-04-15 DIAGNOSIS — C9110 Chronic lymphocytic leukemia of B-cell type not having achieved remission: Secondary | ICD-10-CM

## 2013-04-15 DIAGNOSIS — D809 Immunodeficiency with predominantly antibody defects, unspecified: Secondary | ICD-10-CM

## 2013-04-15 DIAGNOSIS — Z9489 Other transplanted organ and tissue status: Secondary | ICD-10-CM

## 2013-04-15 DIAGNOSIS — R5381 Other malaise: Secondary | ICD-10-CM

## 2013-04-15 DIAGNOSIS — R768 Other specified abnormal immunological findings in serum: Secondary | ICD-10-CM

## 2013-04-15 MED ORDER — SODIUM CHLORIDE 0.9 % IJ SOLN
10.0000 mL | Freq: Once | INTRAMUSCULAR | Status: AC
  Administered 2013-04-15: 10 mL
  Filled 2013-04-15: qty 10

## 2013-04-15 MED ORDER — SODIUM CHLORIDE 0.9 % IV SOLN
Freq: Once | INTRAVENOUS | Status: AC
  Administered 2013-04-15: 10:00:00 via INTRAVENOUS

## 2013-04-15 MED ORDER — SODIUM CHLORIDE 0.9 % IV SOLN
Freq: Once | INTRAVENOUS | Status: AC
  Administered 2013-04-15: 11:00:00 via INTRAVENOUS
  Filled 2013-04-15: qty 500

## 2013-04-15 MED ORDER — HEPARIN SOD (PORK) LOCK FLUSH 100 UNIT/ML IV SOLN
250.0000 [IU] | Freq: Once | INTRAVENOUS | Status: AC
  Administered 2013-04-15: 250 [IU] via INTRAVENOUS
  Filled 2013-04-15: qty 5

## 2013-04-15 MED ORDER — SODIUM CHLORIDE 0.9 % IV SOLN
1.0000 g | Freq: Once | INTRAVENOUS | Status: DC
  Filled 2013-04-15: qty 2

## 2013-04-15 NOTE — Patient Instructions (Addendum)
Dehydration, Adult Dehydration is when you lose more fluids from the body than you take in. Vital organs like the kidneys, brain, and heart cannot function without a proper amount of fluids and salt. Any loss of fluids from the body can cause dehydration.  CAUSES   Vomiting.  Diarrhea.  Excessive sweating.  Excessive urine output.  Fever. SYMPTOMS  Mild dehydration  Thirst.  Dry lips.  Slightly dry mouth. Moderate dehydration  Very dry mouth.  Sunken eyes.  Skin does not bounce back quickly when lightly pinched and released.  Dark urine and decreased urine production.  Decreased tear production.  Headache. Severe dehydration  Very dry mouth.  Extreme thirst.  Rapid, weak pulse (more than 100 beats per minute at rest).  Cold hands and feet.  Not able to sweat in spite of heat and temperature.  Rapid breathing.  Blue lips.  Confusion and lethargy.  Difficulty being awakened.  Minimal urine production.  No tears. DIAGNOSIS  Your caregiver will diagnose dehydration based on your symptoms and your exam. Blood and urine tests will help confirm the diagnosis. The diagnostic evaluation should also identify the cause of dehydration. TREATMENT  Treatment of mild or moderate dehydration can often be done at home by increasing the amount of fluids that you drink. It is best to drink small amounts of fluid more often. Drinking too much at one time can make vomiting worse. Refer to the home care instructions below. Severe dehydration needs to be treated at the hospital where you will probably be given intravenous (IV) fluids that contain water and electrolytes. HOME CARE INSTRUCTIONS   Ask your caregiver about specific rehydration instructions.  Drink enough fluids to keep your urine clear or pale yellow.  Drink small amounts frequently if you have nausea and vomiting.  Eat as you normally do.  Avoid:  Foods or drinks high in sugar.  Carbonated  drinks.  Juice.  Extremely hot or cold fluids.  Drinks with caffeine.  Fatty, greasy foods.  Alcohol.  Tobacco.  Overeating.  Gelatin desserts.  Wash your hands well to avoid spreading bacteria and viruses.  Only take over-the-counter or prescription medicines for pain, discomfort, or fever as directed by your caregiver.  Ask your caregiver if you should continue all prescribed and over-the-counter medicines.  Keep all follow-up appointments with your caregiver. SEEK MEDICAL CARE IF:  You have abdominal pain and it increases or stays in one area (localizes).  You have a rash, stiff neck, or severe headache.  You are irritable, sleepy, or difficult to awaken.  You are weak, dizzy, or extremely thirsty. SEEK IMMEDIATE MEDICAL CARE IF:   You are unable to keep fluids down or you get worse despite treatment.  You have frequent episodes of vomiting or diarrhea.  You have blood or green matter (bile) in your vomit.  You have blood in your stool or your stool looks black and tarry.  You have not urinated in 6 to 8 hours, or you have only urinated a small amount of very dark urine.  You have a fever.  You faint. MAKE SURE YOU:   Understand these instructions.  Will watch your condition.  Will get help right away if you are not doing well or get worse. Document Released: 12/08/2005 Document Revised: 03/01/2012 Document Reviewed: 07/28/2011 Exit[employer] Patient Information 2013 [employer].  

## 2013-04-18 ENCOUNTER — Other Ambulatory Visit (HOSPITAL_BASED_OUTPATIENT_CLINIC_OR_DEPARTMENT_OTHER): Payer: BC Managed Care – PPO | Admitting: Lab

## 2013-04-18 ENCOUNTER — Other Ambulatory Visit: Payer: Self-pay | Admitting: Lab

## 2013-04-18 ENCOUNTER — Ambulatory Visit: Payer: Self-pay | Admitting: Family

## 2013-04-18 ENCOUNTER — Other Ambulatory Visit: Payer: Self-pay | Admitting: Oncology

## 2013-04-18 ENCOUNTER — Ambulatory Visit (HOSPITAL_BASED_OUTPATIENT_CLINIC_OR_DEPARTMENT_OTHER): Payer: BC Managed Care – PPO

## 2013-04-18 VITALS — BP 178/91 | HR 68 | Temp 98.4°F | Resp 17

## 2013-04-18 DIAGNOSIS — C9110 Chronic lymphocytic leukemia of B-cell type not having achieved remission: Secondary | ICD-10-CM

## 2013-04-18 DIAGNOSIS — R5381 Other malaise: Secondary | ICD-10-CM

## 2013-04-18 DIAGNOSIS — R112 Nausea with vomiting, unspecified: Secondary | ICD-10-CM

## 2013-04-18 DIAGNOSIS — D89811 Chronic graft-versus-host disease: Secondary | ICD-10-CM

## 2013-04-18 DIAGNOSIS — R768 Other specified abnormal immunological findings in serum: Secondary | ICD-10-CM

## 2013-04-18 DIAGNOSIS — D809 Immunodeficiency with predominantly antibody defects, unspecified: Secondary | ICD-10-CM

## 2013-04-18 DIAGNOSIS — Z9489 Other transplanted organ and tissue status: Secondary | ICD-10-CM

## 2013-04-18 DIAGNOSIS — R894 Abnormal immunological findings in specimens from other organs, systems and tissues: Secondary | ICD-10-CM

## 2013-04-18 DIAGNOSIS — K449 Diaphragmatic hernia without obstruction or gangrene: Secondary | ICD-10-CM

## 2013-04-18 DIAGNOSIS — R21 Rash and other nonspecific skin eruption: Secondary | ICD-10-CM

## 2013-04-18 DIAGNOSIS — D61818 Other pancytopenia: Secondary | ICD-10-CM

## 2013-04-18 DIAGNOSIS — I4891 Unspecified atrial fibrillation: Secondary | ICD-10-CM

## 2013-04-18 LAB — CBC WITH DIFFERENTIAL/PLATELET
BASO%: 0.1 % (ref 0.0–2.0)
EOS%: 0.3 % (ref 0.0–7.0)
MCH: 31.3 pg (ref 27.2–33.4)
MCHC: 33 g/dL (ref 32.0–36.0)
MCV: 94.7 fL (ref 79.3–98.0)
MONO%: 5.5 % (ref 0.0–14.0)
RDW: 17 % — ABNORMAL HIGH (ref 11.0–14.6)
lymph#: 2.2 10*3/uL (ref 0.9–3.3)

## 2013-04-18 LAB — COMPREHENSIVE METABOLIC PANEL
Albumin: 2.8 g/dL — ABNORMAL LOW (ref 3.5–5.2)
Alkaline Phosphatase: 75 U/L (ref 39–117)
CO2: 21 mEq/L (ref 19–32)
Glucose, Bld: 77 mg/dL (ref 70–99)
Potassium: 4.9 mEq/L (ref 3.5–5.3)
Sodium: 134 mEq/L — ABNORMAL LOW (ref 135–145)
Total Protein: 4.8 g/dL — ABNORMAL LOW (ref 6.0–8.3)

## 2013-04-18 LAB — IGG, IGA, IGM
IgA: <7 mg/dL — ABNORMAL LOW (ref 68–379)
IgG (Immunoglobin G), Serum: 99 mg/dL — ABNORMAL LOW (ref 650–1600)

## 2013-04-18 MED ORDER — SODIUM CHLORIDE 0.9 % IJ SOLN
10.0000 mL | Freq: Once | INTRAMUSCULAR | Status: AC
  Administered 2013-04-18: 10 mL
  Filled 2013-04-18: qty 10

## 2013-04-18 MED ORDER — SODIUM CHLORIDE 0.9 % IV SOLN
INTRAVENOUS | Status: DC
  Administered 2013-04-18: 12:00:00 via INTRAVENOUS

## 2013-04-18 MED ORDER — HEPARIN SOD (PORK) LOCK FLUSH 100 UNIT/ML IV SOLN
250.0000 [IU] | Freq: Once | INTRAVENOUS | Status: AC
  Administered 2013-04-18: 250 [IU] via INTRAVENOUS
  Filled 2013-04-18: qty 5

## 2013-04-18 MED ORDER — SODIUM CHLORIDE 0.9 % IV SOLN
Freq: Once | INTRAVENOUS | Status: AC
  Administered 2013-04-18: 14:00:00 via INTRAVENOUS
  Filled 2013-04-18: qty 500

## 2013-04-18 MED ORDER — MAGNESIUM SULFATE 50 % IJ SOLN: 500.0000 mg | Freq: Once | INTRAVENOUS | Status: DC

## 2013-04-18 NOTE — Patient Instructions (Signed)
Dehydration, Adult Dehydration is when you lose more fluids from the body than you take in. Vital organs like the kidneys, brain, and heart cannot function without a proper amount of fluids and salt. Any loss of fluids from the body can cause dehydration.  CAUSES   Vomiting.  Diarrhea.  Excessive sweating.  Excessive urine output.  Fever. SYMPTOMS  Mild dehydration  Thirst.  Dry lips.  Slightly dry mouth. Moderate dehydration  Very dry mouth.  Sunken eyes.  Skin does not bounce back quickly when lightly pinched and released.  Dark urine and decreased urine production.  Decreased tear production.  Headache. Severe dehydration  Very dry mouth.  Extreme thirst.  Rapid, weak pulse (more than 100 beats per minute at rest).  Cold hands and feet.  Not able to sweat in spite of heat and temperature.  Rapid breathing.  Blue lips.  Confusion and lethargy.  Difficulty being awakened.  Minimal urine production.  No tears. DIAGNOSIS  Your caregiver will diagnose dehydration based on your symptoms and your exam. Blood and urine tests will help confirm the diagnosis. The diagnostic evaluation should also identify the cause of dehydration. TREATMENT  Treatment of mild or moderate dehydration can often be done at home by increasing the amount of fluids that you drink. It is best to drink small amounts of fluid more often. Drinking too much at one time can make vomiting worse. Refer to the home care instructions below. Severe dehydration needs to be treated at the hospital where you will probably be given intravenous (IV) fluids that contain water and electrolytes. HOME CARE INSTRUCTIONS   Ask your caregiver about specific rehydration instructions.  Drink enough fluids to keep your urine clear or pale yellow.  Drink small amounts frequently if you have nausea and vomiting.  Eat as you normally do.  Avoid:  Foods or drinks high in sugar.  Carbonated  drinks.  Juice.  Extremely hot or cold fluids.  Drinks with caffeine.  Fatty, greasy foods.  Alcohol.  Tobacco.  Overeating.  Gelatin desserts.  Wash your hands well to avoid spreading bacteria and viruses.  Only take over-the-counter or prescription medicines for pain, discomfort, or fever as directed by your caregiver.  Ask your caregiver if you should continue all prescribed and over-the-counter medicines.  Keep all follow-up appointments with your caregiver. SEEK MEDICAL CARE IF:  You have abdominal pain and it increases or stays in one area (localizes).  You have a rash, stiff neck, or severe headache.  You are irritable, sleepy, or difficult to awaken.  You are weak, dizzy, or extremely thirsty. SEEK IMMEDIATE MEDICAL CARE IF:   You are unable to keep fluids down or you get worse despite treatment.  You have frequent episodes of vomiting or diarrhea.  You have blood or green matter (bile) in your vomit.  You have blood in your stool or your stool looks black and tarry.  You have not urinated in 6 to 8 hours, or you have only urinated a small amount of very dark urine.  You have a fever.  You faint. MAKE SURE YOU:   Understand these instructions.  Will watch your condition.  Will get help right away if you are not doing well or get worse. Document Released: 12/08/2005 Document Revised: 03/01/2012 Document Reviewed: 07/28/2011 Exit[employer] Patient Information 2013 [employer].  

## 2013-04-19 ENCOUNTER — Telehealth: Payer: Self-pay | Admitting: *Deleted

## 2013-04-19 ENCOUNTER — Other Ambulatory Visit: Payer: Self-pay | Admitting: Oncology

## 2013-04-19 DIAGNOSIS — Z946 Bone transplant status: Secondary | ICD-10-CM

## 2013-04-19 MED ORDER — IMMUNE GLOBULIN (HUMAN) 5 GM/100ML IV SOLN: 1.0000 g/kg | Freq: Once | INTRAVENOUS | Status: DC

## 2013-04-19 NOTE — Progress Notes (Signed)
IgG < 100-- have scheduled for IVIG 04/30.2014

## 2013-04-19 NOTE — Telephone Encounter (Signed)
Pt is already scheduled for fluids 04/20/13@10  am. MB is just going to extend those hours to 6...td

## 2013-04-20 ENCOUNTER — Telehealth: Payer: Self-pay | Admitting: *Deleted

## 2013-04-20 ENCOUNTER — Encounter: Payer: Self-pay | Admitting: Physician Assistant

## 2013-04-20 ENCOUNTER — Ambulatory Visit (HOSPITAL_BASED_OUTPATIENT_CLINIC_OR_DEPARTMENT_OTHER): Payer: BC Managed Care – PPO

## 2013-04-20 ENCOUNTER — Ambulatory Visit (HOSPITAL_BASED_OUTPATIENT_CLINIC_OR_DEPARTMENT_OTHER): Payer: BC Managed Care – PPO | Admitting: Physician Assistant

## 2013-04-20 ENCOUNTER — Other Ambulatory Visit: Payer: Self-pay | Admitting: Emergency Medicine

## 2013-04-20 ENCOUNTER — Other Ambulatory Visit (HOSPITAL_BASED_OUTPATIENT_CLINIC_OR_DEPARTMENT_OTHER): Payer: BC Managed Care – PPO

## 2013-04-20 VITALS — BP 150/79 | HR 76 | Temp 98.4°F | Resp 20 | Ht 67.0 in | Wt 167.0 lb

## 2013-04-20 DIAGNOSIS — D89811 Chronic graft-versus-host disease: Secondary | ICD-10-CM

## 2013-04-20 DIAGNOSIS — E291 Testicular hypofunction: Secondary | ICD-10-CM

## 2013-04-20 DIAGNOSIS — C9110 Chronic lymphocytic leukemia of B-cell type not having achieved remission: Secondary | ICD-10-CM

## 2013-04-20 DIAGNOSIS — Z9489 Other transplanted organ and tissue status: Secondary | ICD-10-CM

## 2013-04-20 DIAGNOSIS — E099 Drug or chemical induced diabetes mellitus without complications: Secondary | ICD-10-CM

## 2013-04-20 DIAGNOSIS — R5381 Other malaise: Secondary | ICD-10-CM

## 2013-04-20 DIAGNOSIS — D89813 Graft-versus-host disease, unspecified: Secondary | ICD-10-CM

## 2013-04-20 DIAGNOSIS — I10 Essential (primary) hypertension: Secondary | ICD-10-CM

## 2013-04-20 DIAGNOSIS — D849 Immunodeficiency, unspecified: Secondary | ICD-10-CM

## 2013-04-20 DIAGNOSIS — H9193 Unspecified hearing loss, bilateral: Secondary | ICD-10-CM

## 2013-04-20 DIAGNOSIS — E860 Dehydration: Secondary | ICD-10-CM

## 2013-04-20 DIAGNOSIS — D801 Nonfamilial hypogammaglobulinemia: Secondary | ICD-10-CM

## 2013-04-20 DIAGNOSIS — R197 Diarrhea, unspecified: Secondary | ICD-10-CM

## 2013-04-20 LAB — COMPREHENSIVE METABOLIC PANEL (CC13)
AST: 25 U/L (ref 5–34)
Alkaline Phosphatase: 88 U/L (ref 40–150)
BUN: 37 mg/dL — ABNORMAL HIGH (ref 7.0–26.0)
Glucose: 152 mg/dl — ABNORMAL HIGH (ref 70–99)
Sodium: 136 mEq/L (ref 136–145)
Total Bilirubin: 0.38 mg/dL (ref 0.20–1.20)

## 2013-04-20 LAB — CBC WITH DIFFERENTIAL/PLATELET
BASO%: 0 % (ref 0.0–2.0)
EOS%: 0 % (ref 0.0–7.0)
LYMPH%: 31.1 % (ref 14.0–49.0)
MCHC: 32.9 g/dL (ref 32.0–36.0)
MONO#: 0.4 10*3/uL (ref 0.1–0.9)
MONO%: 6.2 % (ref 0.0–14.0)
Platelets: 105 10*3/uL — ABNORMAL LOW (ref 140–400)
RBC: 3.34 10*6/uL — ABNORMAL LOW (ref 4.20–5.82)
WBC: 6 10*3/uL (ref 4.0–10.3)

## 2013-04-20 MED ORDER — SODIUM CHLORIDE 0.9 % IV SOLN
INTRAVENOUS | Status: DC
  Administered 2013-04-20: 11:00:00 via INTRAVENOUS

## 2013-04-20 MED ORDER — PREDNISONE 20 MG PO TABS: 40.0000 mg | ORAL_TABLET | Freq: Every day | ORAL | Status: DC

## 2013-04-20 MED ORDER — DILTIAZEM HCL ER COATED BEADS 180 MG PO TB24: 180.0000 mg | ORAL_TABLET | Freq: Every day | ORAL | Status: DC

## 2013-04-20 MED ORDER — CHOLESTYRAMINE 4 G PO PACK: 1.0000 | PACK | Freq: Two times a day (BID) | ORAL | Status: DC

## 2013-04-20 MED ORDER — IMMUNE GLOBULIN (HUMAN) 5 GM/100ML IV SOLN
30.0000 g | Freq: Once | INTRAVENOUS | Status: AC
  Administered 2013-04-20: 30 g via INTRAVENOUS
  Filled 2013-04-20: qty 600

## 2013-04-20 MED ORDER — HYDROCODONE-ACETAMINOPHEN 10-325 MG PO TABS: 1.0000 | ORAL_TABLET | Freq: Every day | ORAL | Status: DC | PRN

## 2013-04-20 MED ORDER — SODIUM CHLORIDE 0.9 % IV SOLN
Freq: Once | INTRAVENOUS | Status: AC
  Administered 2013-04-20: 12:00:00 via INTRAVENOUS
  Filled 2013-04-20: qty 500

## 2013-04-20 NOTE — Telephone Encounter (Signed)
appts made and printed. Also gv appt for Dr. Lazarus Salines. Pt is aware that his tx for 04/27/13 will follow after seeing Amy...td

## 2013-04-20 NOTE — Progress Notes (Signed)
ID: Timothy [name]   DOB: 1946/07/29  MR#: [id number]  WUJ#:[id number]  NFA:Timothy DOUGLAS, MD SU: OTHER MD: Timothy [name]  HISTORY OF PRESENT ILLNESS: We have very complete records from Dr. Sydnee Levans in [place], and in summary:  The patient was initially diagnosed in August 2000, with a white cell count of 23,600, but normal hemoglobin and platelets, and no significant symptomatology. Over the next several years his white cell count drifted up, and he eventually developed some symptoms of night sweats in particular, leading to treatment with fludarabine, Cytoxan and rituxan for five cycles given between December 2006 and May 2007.  We have CT scans from June 2006, November 2006 and April 2007, and comparing the November 2006 and April 2007 scans, there was near complete response. He had subsequent therapy in [place] as detailed below, but with decreased response, leading to allogeneic stem-cell transplant at the [facility] 02/24/2012.  INTERVAL HISTORY: Timothy [name] returns with his wife Timothy [name]  for followup of his CLL, status post allogeneic stem cell transplant in [place] in March 2013.   Timothy [name] continues on oral drink oh in addition to Timothy [name], and his diarrhea is improving. His stools are smaller, less frequent, and more formed. They're averaging 5-6 times daily which is an improvement, and he is able to make it to the restroom a little easier than before. He denies any nausea or emesis. She's had no abdominal pain. He denies any fevers or chills.  Timothy [name] continues to follow his blood sugars closely, and most of the time they are below 200. On occasion they have been higher, but this seems to be directly related to something he has eaten. His blood pressures have been higher, the highest being 173/114 this past week. He continues on labetalol, twice daily. He was taken off ACE inhibitors due to hyperkalemia.    REVIEW OF SYSTEMS: Otherwise, Timothy [name] is feeling well. His energy  level has improved. He is continuing to exercise as much as possible. He has noted no new rashes or skin changes. His appetite has improved, and he is actually been able to gain a few pounds.  He denies any new cough. He has shortness of breath with exertion. He's had no chest pain or palpitations. He denies any abnormal headaches or dizziness.He does the like he can't hear as well as he could previously, and has some mild blurred vision. He currently denies any unusual myalgias, arthralgias, bony pain, or excessive swelling.  A detailed review of systems is otherwise stable and noncontributory.  PAST MEDICAL HISTORY: Past Medical History  Diagnosis Date  . 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 Diverticular disease   . Hyperlipidemia   . Obesity   . Hypertension   . Hiatal hernia   . CMV (cytomegalovirus) antibody positive     pre-transplant, with seroconversion x2 pst-transplant  . Right bundle branch block     pre-transplant  . CKD (chronic kidney disease) stage 2, GFR 60-89 ml/min   . Pancytopenia   . Steroid-induced diabetes   . Atrial fibrillation     post-transplant  . Myopathy   . 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 Leukemia, chronic lymphoid   . 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 Diarrhea in adult patient 12/05/2012    Due to active GVHD  . CLL (chronic lymphocytic leukemia) 12/05/2012    Dx 07/1999; started Rx 12/06  AlloBMT 3/13  .  Rash of face 12/05/2012    Due to GVHD  . Hypomagnesemia 01/26/2013    PAST SURGICAL HISTORY: Past Surgical History  Procedure Laterality Date  . Tonsillectomy and adenoidectomy    . Bone marrow transplant    . Flexible sigmoidoscopy  11/17/2012    Procedure: FLEXIBLE SIGMOIDOSCOPY;  Surgeon: Timothy Kuba, MD;  Location: [facility] END[facility];  Service: Endoscopy;  Laterality: N/A;  Dr Ewing Schlein states will be admitted to rooom 1339  11/16/12  . Esophagogastroduodenoscopy  11/17/2012    Procedure: ESOPHAGOGASTRODUODENOSCOPY (EGD);  Surgeon: Timothy Kuba, MD;  Location: [facility];  Service: Endoscopy;  Laterality: N/A;    FAMILY HISTORY Family History  Problem Relation Age of Onset  . Cancer Father    The patient's father died from complications of chronic lymphocytic leukemia at the age of 35.  It had been diagnosed seven years before when he was 51.  The patient is enrolled in a familial chronic lymphocytic leukemia study out of the [facility].  The patient's mother is 20, alive, unfortunately suffering with dementia, and he has a brother, 51, who is otherwise in fair health.   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  His wife of >40 years, Timothy [name], is a homemaker.  Their daughter, Timothy [name], lives in [place].  She also is a Futures trader.  The patient has an 62 year old grandson and an 13-year-old granddaughter, and that is really the main reason he moved to this area.  He is a International aid/development worker.     ADVANCED DIRECTIVES: In place  HEALTH MAINTENANCE: History  Substance Use Topics  . Smoking status: Never Smoker   . Smokeless tobacco: Never Used  . Alcohol Use: No     Colonoscopy:  PSA:  Bone density:  Lipid panel:  Allergies  Allergen Reactions  . Benadryl (Diphenhydramine Hcl)     "Restless leg syndrome"    Current Outpatient Prescriptions  Medication Sig Dispense Refill  . acyclovir (ZOVIRAX) 400 MG tablet Take 2 tablets (800 mg total) by mouth 2 (two) times daily.  60 tablet  12  . antiseptic oral rinse (BIOTENE) LIQD 15 mLs by Mouth Rinse route 2 times daily at 12 noon and 4 pm.  237 mL  6  . B-D UF III MINI PEN NEEDLES 31G X 5 MM MISC       . Blood Glucose Monitoring Suppl (ONE TOUCH ULTRA 2) W/DEVICE KIT       . budesonide (ENTOCORT EC) 3 MG 24 hr capsule Take 9 mg by  mouth 3 (three) times daily.      . Calcium Citrate (CITRACAL PO) Take 1,200 mg by mouth 2 (two) times daily.      . cholecalciferol (VITAMIN D) 1000 UNITS tablet Take 1,000 Units by mouth every evening.       . cholestyramine (QUESTRAN) 4 G packet Take 1 packet by mouth 2 (two) times daily with a meal.  60 each  1  . diphenoxylate-atropine (LOMOTIL) 2.5-0.025 MG per tablet       . dronabinol (MARINOL) 2.5 MG capsule Take 1 capsule (2.5 mg total) by mouth 2 (two) times daily before lunch and supper.  60 capsule  0  . fluconazole (DIFLUCAN) 100 MG tablet Take 1 tablet (100 mg total) by mouth every morning.  30 tablet  12  . glucose blood (ONE TOUCH ULTRA TEST) test strip As directed  150 each  6  . HYDROcodone-acetaminophen (NORCO) 10-325 MG per tablet Take 1 tablet by mouth daily as needed for pain (may take 1/2 to 1 tablet in the am as needed for pain).  30 tablet  0  . insulin aspart protamine-insulin aspart (NOVOLOG 70/30) (70-30) 100 UNIT/ML injection Inject 18 Units into the skin daily with breakfast.      . insulin aspart protamine-insulin aspart (NOVOLOG 70/30) (70-30) 100 UNIT/ML injection Inject 9 Units into the skin daily with supper.      . labetalol (NORMODYNE) 200 MG tablet Take 1 tablet (200 mg total) by mouth 2 (two) times daily.  60 tablet  12  . Lidocaine-Hydrocortisone Ace 3-0.5 % KIT       . Multiple Vitamins-Minerals (CENTRUM SILVER ADULT 50+) TABS Take 1 tablet by mouth every evening.       . mupirocin ointment (BACTROBAN) 2 %       . nateglinide (STARLIX) 120 MG tablet Take 1 tablet (120 mg total) by mouth 3 (three) times daily before meals.  90 tablet  11  . Nutritional Supplements (FEEDING SUPPLEMENT, VITAL 1.5 CAL,) LIQD Take 237 mLs by mouth 2 (two) times daily.  24 Can  12  . omeprazole (PRILOSEC) 20 MG capsule Take 20 mg by mouth daily.      Letta Pate DELICA LANCETS 33G MISC 33 g by Does not apply route as directed.  200 each  6  . predniSONE (DELTASONE) 10 MG tablet  Take 1 tablet (10 mg total) by mouth daily.  30 tablet  2  . predniSONE (DELTASONE) 20 MG tablet Take 2 tablets (40 mg total) by mouth daily.  60 tablet  2  . sertraline (ZOLOFT) 50 MG tablet Takes 50 mg one day and 100 mg the next day  90 tablet  3  . Sodium Chloride Flush (NORMAL SALINE FLUSH) 0.9 % SOLN       . sulfamethoxazole-trimethoprim (BACTRIM DS) 800-160 MG per tablet Take 1 tablet by mouth daily.  30 tablet  11  . tacrolimus (PROGRAF) 0.5 MG capsule Take 1.5 mg by mouth 2 (two) times daily.      Marland Kitchen testosterone (ANDRODERM) 4 MG/24HR PT24 patch Place 1 patch onto the skin daily.  30 patch  3  . vancomycin (VANCOCIN) 125 MG capsule Take 1 capsule (125 mg total) by mouth 4 (four) times daily.  52 capsule  3  . zinc gluconate 50 MG tablet Take 50 mg by mouth daily.      Marland Kitchen diltiazem (CARDIZEM LA) 180 MG 24 hr tablet Take 1 tablet (180 mg total) by mouth daily.  30 tablet  3   No current facility-administered medications for this visit.   Facility-Administered Medications Ordered in Other Visits  Medication Dose Route Frequency Provider Last Rate Last Dose  . 0.9 %  sodium chloride infusion   Intravenous Continuous Jakaleb Payer G Trent Gabler, PA-C 500 mL/hr at 03/12/13 0900    . 0.9 %  sodium chloride infusion   Intravenous Continuous Lowella Dell, MD 20 mL/hr at 04/20/13 1057    . [START ON 04/21/2013] Immune Globulin 5% (OCTAGAM) SOLN 30 g  30 g Intravenous Once Lowella Dell, MD      . sodium chloride 0.9 % injection 10 mL  10 mL Intravenous PRN Lowella Dell, MD   10 mL at 08/11/12 1606    OBJECTIVE: Middle-aged white male in no acute distress. Filed Vitals:   04/20/13 0935  BP: 150/79  Pulse: 76  Temp: 98.4 F (36.9 C)  Resp: 20     Body mass index is 26.15 kg/(m^2).   Filed Weights   04/20/13 0935  Weight: 167 lb (75.751 kg)   ECOG FS: 2  Face shows no rash Sclerae unicteric Oropharynx clear Lungs no rales or rhonchi Heart regular rate and rhythm Abdomen  soft,  nontender, positive bowel sounds Minimal bilateral lower extremity  edema Neuro: nonfocal, well oriented, positive affect    LAB RESULTS:  CBC    Component Value Date/Time   WBC 6.0 04/20/2013 0904   WBC 5.4 03/18/2013 0615   RBC 3.34* 04/20/2013 0904   RBC 3.17* 03/18/2013 0615   HGB 10.4* 04/20/2013 0904   HGB 9.9* 03/18/2013 0615   HCT 31.6* 04/20/2013 0904   HCT 28.8* 03/18/2013 0615   PLT 105* 04/20/2013 0904   PLT 92* 03/18/2013 0615   MCV 94.6 04/20/2013 0904   MCV 90.9 03/18/2013 0615   MCH 31.1 04/20/2013 0904   MCH 31.2 03/18/2013 0615   MCHC 32.9 04/20/2013 0904   MCHC 34.4 03/18/2013 0615   RDW 17.2* 04/20/2013 0904   RDW 14.7 03/18/2013 0615   LYMPHSABS 1.9 04/20/2013 0904   LYMPHSABS 1.4 03/18/2013 0615   MONOABS 0.4 04/20/2013 0904   MONOABS 0.3 03/18/2013 0615   EOSABS 0.0 04/20/2013 0904   EOSABS 0.0 03/18/2013 0615   BASOSABS 0.0 04/20/2013 0904   BASOSABS 0.0 03/18/2013 0615        Chemistry      Component Value Date/Time   NA 136 04/20/2013 0904   NA 134* 04/18/2013 1218   K 4.4 04/20/2013 0904   K 4.9 04/18/2013 1218   CL 111* 04/20/2013 0904   CL 106 04/18/2013 1218   CO2 19* 04/20/2013 0904   CO2 21 04/18/2013 1218   BUN 37.0* 04/20/2013 0904   BUN 44* 04/18/2013 1218   CREATININE 1.1 04/20/2013 0904   CREATININE 0.95 04/18/2013 1218      Component Value Date/Time   CALCIUM 8.7 04/20/2013 0904   CALCIUM 9.0 04/18/2013 1218   ALKPHOS 88 04/20/2013 0904   ALKPHOS 75 04/18/2013 1218   AST 25 04/20/2013 0904   AST 24 04/18/2013 1218   ALT 36 04/20/2013 0904   ALT 33 04/18/2013 1218   BILITOT 0.38 04/20/2013 0904   BILITOT 0.2* 04/18/2013 1218     Magnesium  1.9  04/20/2013    1.8  04/18/2013    2.2  04/13/2013  IgG    99  04/18/2013    308  03/14/2013    124  02/24/2013   STUDIES:  No results found.    ASSESSMENT: 67 y.o. [place] man with a history of well-differentiated lymphocytic lymphoma/ chronic lymphoid leukemia initially diagnosed in 2000, not  requiring intervention until 2006; with multiple chromosomal abnormalities.  His treatment history is as follows:  (1) fludarabine/cyclophosphamide/rituximab x5 completed May 2007.   (2) rituximab for 8 doses October 2010, with partial response   (3) Leustatin and ofatumumab weekly x8 July to September 2011 followed by maintenance ofatumumab maintenance ofatumumab every 2 months, with initial response but rising counts September 2012   (4) status-post unrelated donor stem-cell transplant 02/24/2012 at [facility]  (a) conditioning regimen consisted of fludarabine + TBI at 200 cGy, followed by rituximab x27;  (b) CMV reactivation x3 (patient CMV positive, donor negative), s/p ganciclovir treatment; 3d reactivation August 2013, s/p gancyclovir, with negative PCR mid-September 2013; last gancyclovir dose 10/06/2012 (c) Chronic GVHD: involving gut and skin, treated  with steroids, tacrolimus and MMF.  MMF was eventually d/c'd and tacrolimus currently at a dose of 1.5mg  BID (d) atrial fibrillation: resolved on brief amiodarone regimen (e) steroid-induced myopathy: improving  (f) hypomagnesemia: improved after d/c gancyclovir (g) hypogammaglobulinemia: s/p IVIG most recently 01/07/2013. (h) history of elevated triglycerides (606 on 07/14/2012)  (i) adrenal insufficiency: on prednisone and budesonide (j) pancytopenia, improved  (5) restaging studies September 2013  including CT scans, flow cytometry, and bone marrow biopsy, showed no evidence of residual chronic lymphoid leukemia.  (6) recurrent GVHD (skin rash, mouth changes, severe diarrhea and gastric/duodenal/colonic biopsies 11/17/2012 c/w GVHD grade 2) now only remaining sign is bothersome diarrhea; c diff negative x3; continuing current regimen   (7) severe malnutrition -- on VITAL supplement in addition to regular diet; on Marinol for anorexia  (8) testosterone deficiency--on patch   (9) deconditioning: ongoing REHAB   (10) mild  dehydration: encouraged increased po fluids; receives IVF support w magnesium three times weekly  (11) steroid-induced osteoporosis with compression fractures: received pamidronate 12/18/2012   (12) nausea: improved on current meds  (13)  Positive c.diff, 03/08/2013, on Flagyl 500 mg TID x 20 days, then on oral vanco with Questran, showing improvement  (14) hyperkalemia, improving  (15)  Hypertension, on labetalol and adding cardizem  (16) steroid induced hyperglycemia, on sterlix and 70/30 insulin  PLAN: Catarino will continue receiving IV fluids 3 times weekly and will also receive IVIG today as planned. He'll continue on oral bank and Questran for diarrhea, and will let us know if his bowel movements increase in frequency. We are adding Cardizem, 180 mg daily, for hypertension, and he'll continue to follow his blood pressures in addition to his glucose levels. We will try to slowly decrease his prednisone, and he will go down to 40 mg instead of 50 mg daily over the next week. I have refilled his oxycodone/APAP which he takes on average of once daily for pain.  [name] would like to have further evaluation for decrease in hearing, and accordingly I am referring him to Dr. Lazarus Salines for further evaluation. We also suggested he try making an appointment with [facility] for basic eye exams and evaluation for glaucoma.  For financial reasons, Sandip would like to see [name] a little less frequently. Although we will continue to have him come in 3 times weekly for labs and IV fluids, we will only see him for visits every other week.  Since we are making so many changes this week, I would like to see Shafin back next week for brief followup on May 7.  He will have labs drawn Monday prior to that appointment on Wednesday.  Of course, he and Timothy [name] both know to call if he has any changes or problems.   Zollie Scale    04/20/2013

## 2013-04-20 NOTE — Patient Instructions (Addendum)

## 2013-04-22 ENCOUNTER — Ambulatory Visit (HOSPITAL_BASED_OUTPATIENT_CLINIC_OR_DEPARTMENT_OTHER): Payer: BC Managed Care – PPO

## 2013-04-22 MED ORDER — HEPARIN SOD (PORK) LOCK FLUSH 100 UNIT/ML IV SOLN
500.0000 [IU] | Freq: Once | INTRAVENOUS | Status: DC
  Filled 2013-04-22: qty 5

## 2013-04-22 MED ORDER — SODIUM CHLORIDE 0.9 % IJ SOLN
10.0000 mL | INTRAMUSCULAR | Status: DC | PRN
  Filled 2013-04-22: qty 10

## 2013-04-22 MED ORDER — SODIUM CHLORIDE 0.9 % IV SOLN
Freq: Once | INTRAVENOUS | Status: AC
  Administered 2013-04-22: 10:00:00 via INTRAVENOUS
  Filled 2013-04-22: qty 500

## 2013-04-22 NOTE — Patient Instructions (Addendum)
   Magnesium This is a blood test which measures the amount of magnesium in your blood. Most of the magnesium in your body exists in your cells. However, the magnesium in your blood is important for many processes. It is important for your nerves to be able to conduct electrical energy. This is important in heart patients with higher heartbeats. When your heart beats and then gets ready to beat again, it repolarizes. If the magnesium levels are low or high, it can affect the accuracy of the repolarization process. The magnesium levels are also monitored during pregnancy. This is done to determine if the expectant mother may have preeclampsia or toxemia. Magnesium is often used for treatment of these problems. PREPARATION FOR TEST No preparation or fasting is needed. A blood sample may be taken by inserting a needle into a vein in the arm.  NORMAL FINDINGS  Adult: 1.3 to 2.1 mEq/L or 0.65 to 1.05 mmol/L (SI units)  Child: 1.4 to 1.7 mEq/L  Newborn: 1.4 to 2 mEq/L Possible critical values: less than 0.5 mEq/L or greater than 3 mEq/L Ranges for normal findings may vary among different laboratories and hospitals. You should always check with your caregiver after having lab work or other tests done to discuss the meaning of your test results and whether your values are considered within normal limits. MEANING OF TEST  Your caregiver will go over the test results with you. Your caregiver will discuss the importance and meaning of your results. He or she will also discuss treatment options and additional tests, if needed. OBTAINING THE TEST RESULTS It is your responsibility to obtain your test results. Ask the lab or department performing the test when and how you will get your results. Document Released: 01/10/2005 Document Revised: 03/01/2012 Document Reviewed: 11/18/2008 [facility] Patient Information 2013 [place], [place].

## 2013-04-25 ENCOUNTER — Ambulatory Visit: Payer: Self-pay | Admitting: Family

## 2013-04-25 ENCOUNTER — Other Ambulatory Visit: Payer: Self-pay | Admitting: Oncology

## 2013-04-25 ENCOUNTER — Ambulatory Visit (HOSPITAL_BASED_OUTPATIENT_CLINIC_OR_DEPARTMENT_OTHER): Payer: BC Managed Care – PPO

## 2013-04-25 ENCOUNTER — Other Ambulatory Visit (HOSPITAL_BASED_OUTPATIENT_CLINIC_OR_DEPARTMENT_OTHER): Payer: BC Managed Care – PPO

## 2013-04-25 ENCOUNTER — Other Ambulatory Visit: Payer: Self-pay | Admitting: Lab

## 2013-04-25 DIAGNOSIS — C9110 Chronic lymphocytic leukemia of B-cell type not having achieved remission: Secondary | ICD-10-CM

## 2013-04-25 DIAGNOSIS — D89811 Chronic graft-versus-host disease: Secondary | ICD-10-CM

## 2013-04-25 LAB — CBC WITH DIFFERENTIAL/PLATELET
BASO%: 0.1 % (ref 0.0–2.0)
EOS%: 0 % (ref 0.0–7.0)
HCT: 30.7 % — ABNORMAL LOW (ref 38.4–49.9)
LYMPH%: 16.4 % (ref 14.0–49.0)
MCH: 31.3 pg (ref 27.2–33.4)
MCHC: 32.9 g/dL (ref 32.0–36.0)
MONO#: 0.3 10*3/uL (ref 0.1–0.9)
NEUT%: 79.4 % — ABNORMAL HIGH (ref 39.0–75.0)
RBC: 3.23 10*6/uL — ABNORMAL LOW (ref 4.20–5.82)
WBC: 7.5 10*3/uL (ref 4.0–10.3)
lymph#: 1.2 10*3/uL (ref 0.9–3.3)
nRBC: 0 % (ref 0–0)

## 2013-04-25 LAB — COMPREHENSIVE METABOLIC PANEL (CC13)
ALT: 34 U/L (ref 0–55)
AST: 31 U/L (ref 5–34)
Alkaline Phosphatase: 85 U/L (ref 40–150)
BUN: 48.9 mg/dL — ABNORMAL HIGH (ref 7.0–26.0)
Calcium: 8.7 mg/dL (ref 8.4–10.4)
Creatinine: 1.1 mg/dL (ref 0.7–1.3)
Total Bilirubin: 0.46 mg/dL (ref 0.20–1.20)

## 2013-04-25 MED ORDER — SODIUM CHLORIDE 0.9 % IV SOLN
INTRAVENOUS | Status: AC
  Administered 2013-04-25: 13:00:00 via INTRAVENOUS

## 2013-04-25 MED ORDER — SODIUM CHLORIDE 0.9 % IJ SOLN
10.0000 mL | INTRAMUSCULAR | Status: DC | PRN
  Administered 2013-04-25: 10 mL via INTRAVENOUS
  Filled 2013-04-25: qty 10

## 2013-04-25 MED ORDER — SODIUM CHLORIDE 0.9 % IV SOLN
Freq: Once | INTRAVENOUS | Status: AC
  Administered 2013-04-25: 14:00:00 via INTRAVENOUS
  Filled 2013-04-25: qty 500

## 2013-04-25 MED ORDER — HEPARIN SOD (PORK) LOCK FLUSH 100 UNIT/ML IV SOLN
500.0000 [IU] | Freq: Once | INTRAVENOUS | Status: AC
  Administered 2013-04-25: 250 [IU] via INTRAVENOUS
  Filled 2013-04-25: qty 5

## 2013-04-25 NOTE — Progress Notes (Signed)
Patient wanted RN to look at his left eye today in the infusion room. Patient woke up with blood shot eye on Sunday. Patient denies pain, blurred vision, itching and scratching. Patient has not done anything to the eye..like washing it out, eye drops, etc. Will call MD to inform him.

## 2013-04-25 NOTE — Patient Instructions (Addendum)
Hypomagnesemia Magnesium is a common ion (mineral) in the body which is needed for metabolism. It is about how the body handles food and other chemical reactions necessary for life. Only about 2% of the magnesium in our body is found in the blood. When this is low, it is called hypomagnesemia. The blood will measure only a tiny amount of the magnesium in our body. When it is low in our blood, it does not mean that the whole body supply is low. The normal serum concentration ranges from 1.8-2.5 mEq/L. When the level gets to be less than 1.0 mEq/L, a number of problems begin to happen.  CAUSES   Receiving intravenous fluids without magnesium replacement.  Loss of magnesium from the bowel by naso-gastric suction.  Loss of magnesium from nausea and vomiting or severe diarrhea. Any of the inflammatory bowel conditions can cause this.  Abuse of alcohol often leads to low serum magnesium.  An inherited form of magnesium loss happens when the kidneys lose magnesium. This is called familial or primary hypomagnesemia.  Some medications such as diuretics also cause the loss of magnesium. SYMPTOMS  These following problems are worse if the changes in magnesium levels come on suddenly.  Tremor.  Confusion.  Muscle weakness.  Over-sensitive to sights and sounds.  Sensitive reflexes.  Depression.  Muscular fibrillations.  Over-reactivity of the nerves.  Irritability.  Psychosis.  Spasms of the hand muscles.  Tetany (where the muscles go into uncontrollable spasms). DIAGNOSIS  This condition can be diagnosed by blood tests. TREATMENT   In emergency, magnesium can be given intravenously (by vein).  If the condition is less worrisome, it can be corrected by diet. High levels of magnesium are found in green leafy vegetables, peas, beans and nuts among other things. It can also be given through medications by mouth.  If it is being caused by medications, changes can be made.  If  alcohol is a problem, help is available if there are difficulties giving it up. Document Released: 09/03/2005 Document Revised: 03/01/2012 Document Reviewed: 07/28/2008 Exit[employer] Patient Information 2013 [employer].  

## 2013-04-26 LAB — TACROLIMUS LEVEL: Tacrolimus Lvl: 12.8 ng/mL (ref 5.0–20.0)

## 2013-04-27 ENCOUNTER — Encounter: Payer: Self-pay | Admitting: Physician Assistant

## 2013-04-27 ENCOUNTER — Ambulatory Visit: Payer: Self-pay

## 2013-04-27 ENCOUNTER — Other Ambulatory Visit (HOSPITAL_BASED_OUTPATIENT_CLINIC_OR_DEPARTMENT_OTHER): Payer: BC Managed Care – PPO | Admitting: Lab

## 2013-04-27 ENCOUNTER — Telehealth: Payer: Self-pay | Admitting: Oncology

## 2013-04-27 ENCOUNTER — Ambulatory Visit (HOSPITAL_BASED_OUTPATIENT_CLINIC_OR_DEPARTMENT_OTHER): Payer: BC Managed Care – PPO | Admitting: Physician Assistant

## 2013-04-27 ENCOUNTER — Ambulatory Visit (HOSPITAL_BASED_OUTPATIENT_CLINIC_OR_DEPARTMENT_OTHER): Payer: BC Managed Care – PPO

## 2013-04-27 VITALS — BP 174/94 | HR 83 | Temp 98.4°F | Resp 20 | Ht 67.0 in | Wt 173.4 lb

## 2013-04-27 DIAGNOSIS — C9110 Chronic lymphocytic leukemia of B-cell type not having achieved remission: Secondary | ICD-10-CM

## 2013-04-27 DIAGNOSIS — D809 Immunodeficiency with predominantly antibody defects, unspecified: Secondary | ICD-10-CM

## 2013-04-27 DIAGNOSIS — E139 Other specified diabetes mellitus without complications: Secondary | ICD-10-CM

## 2013-04-27 DIAGNOSIS — E099 Drug or chemical induced diabetes mellitus without complications: Secondary | ICD-10-CM

## 2013-04-27 DIAGNOSIS — R259 Unspecified abnormal involuntary movements: Secondary | ICD-10-CM

## 2013-04-27 DIAGNOSIS — N182 Chronic kidney disease, stage 2 (mild): Secondary | ICD-10-CM

## 2013-04-27 DIAGNOSIS — E860 Dehydration: Secondary | ICD-10-CM

## 2013-04-27 DIAGNOSIS — R197 Diarrhea, unspecified: Secondary | ICD-10-CM

## 2013-04-27 DIAGNOSIS — I10 Essential (primary) hypertension: Secondary | ICD-10-CM

## 2013-04-27 DIAGNOSIS — D89811 Chronic graft-versus-host disease: Secondary | ICD-10-CM

## 2013-04-27 DIAGNOSIS — G252 Other specified forms of tremor: Secondary | ICD-10-CM

## 2013-04-27 DIAGNOSIS — T380X5A Adverse effect of glucocorticoids and synthetic analogues, initial encounter: Secondary | ICD-10-CM

## 2013-04-27 DIAGNOSIS — Z9489 Other transplanted organ and tissue status: Secondary | ICD-10-CM

## 2013-04-27 LAB — CBC WITH DIFFERENTIAL/PLATELET
Basophils Absolute: 0 10*3/uL (ref 0.0–0.1)
Eosinophils Absolute: 0 10*3/uL (ref 0.0–0.5)
HCT: 31.7 % — ABNORMAL LOW (ref 38.4–49.9)
HGB: 10.4 g/dL — ABNORMAL LOW (ref 13.0–17.1)
MCH: 31.5 pg (ref 27.2–33.4)
MCV: 96.1 fL (ref 79.3–98.0)
MONO%: 3.3 % (ref 0.0–14.0)
NEUT#: 4.7 10*3/uL (ref 1.5–6.5)
NEUT%: 78.7 % — ABNORMAL HIGH (ref 39.0–75.0)
Platelets: 101 10*3/uL — ABNORMAL LOW (ref 140–400)
RDW: 17.3 % — ABNORMAL HIGH (ref 11.0–14.6)

## 2013-04-27 LAB — COMPREHENSIVE METABOLIC PANEL (CC13)
AST: 34 U/L (ref 5–34)
Alkaline Phosphatase: 88 U/L (ref 40–150)
BUN: 53.3 mg/dL — ABNORMAL HIGH (ref 7.0–26.0)
Creatinine: 1.2 mg/dL (ref 0.7–1.3)
Total Bilirubin: 0.42 mg/dL (ref 0.20–1.20)

## 2013-04-27 MED ORDER — SODIUM CHLORIDE 0.9 % IV SOLN
Freq: Once | INTRAVENOUS | Status: AC
  Administered 2013-04-27: 17:00:00 via INTRAVENOUS
  Filled 2013-04-27: qty 500

## 2013-04-27 NOTE — Telephone Encounter (Signed)
, °

## 2013-04-27 NOTE — Progress Notes (Signed)
Dressing change on hickman cath. Sterile procedure used.  Slight redness noted at insertion site, no drainage noted.  Redness along barrier when dressing tape, moved new dressing over so not to include red area,   Stitch holding line broken, line measured 13/8" from insertion to stitch.  Caps changed. Pt tolerated well, advised to have dr or pa look at site when pt returns on fri. Pt verbalized understanding.   Dmr

## 2013-04-27 NOTE — Patient Instructions (Addendum)
Hypomagnesemia Magnesium is a common ion (mineral) in the body which is needed for metabolism. It is about how the body handles food and other chemical reactions necessary for life. Only about 2% of the magnesium in our body is found in the blood. When this is low, it is called hypomagnesemia. The blood will measure only a tiny amount of the magnesium in our body. When it is low in our blood, it does not mean that the whole body supply is low. The normal serum concentration ranges from 1.8-2.5 mEq/L. When the level gets to be less than 1.0 mEq/L, a number of problems begin to happen.  CAUSES   Receiving intravenous fluids without magnesium replacement.  Loss of magnesium from the bowel by naso-gastric suction.  Loss of magnesium from nausea and vomiting or severe diarrhea. Any of the inflammatory bowel conditions can cause this.  Abuse of alcohol often leads to low serum magnesium.  An inherited form of magnesium loss happens when the kidneys lose magnesium. This is called familial or primary hypomagnesemia.  Some medications such as diuretics also cause the loss of magnesium. SYMPTOMS  These following problems are worse if the changes in magnesium levels come on suddenly.  Tremor.  Confusion.  Muscle weakness.  Over-sensitive to sights and sounds.  Sensitive reflexes.  Depression.  Muscular fibrillations.  Over-reactivity of the nerves.  Irritability.  Psychosis.  Spasms of the hand muscles.  Tetany (where the muscles go into uncontrollable spasms). DIAGNOSIS  This condition can be diagnosed by blood tests. TREATMENT   In emergency, magnesium can be given intravenously (by vein).  If the condition is less worrisome, it can be corrected by diet. High levels of magnesium are found in green leafy vegetables, peas, beans and nuts among other things. It can also be given through medications by mouth.  If it is being caused by medications, changes can be made.  If  alcohol is a problem, help is available if there are difficulties giving it up. Document Released: 09/03/2005 Document Revised: 03/01/2012 Document Reviewed: 07/28/2008 Exit[employer] Patient Information 2013 [employer].  

## 2013-04-27 NOTE — Progress Notes (Signed)
ID: Timothy [name]   DOB: 06-11-1946  MR#: [id number]  WUJ#:[id number]  NFA:OZHY,Q DOUGLAS, MD SU: OTHER MD: Donzetta Starch  HISTORY OF PRESENT ILLNESS: We have very complete records from Dr. Sydnee Levans in [place], and in summary:  The patient was initially diagnosed in August 2000, with a white cell count of 23,600, but normal hemoglobin and platelets, and no significant symptomatology. Over the next several years his white cell count drifted up, and he eventually developed some symptoms of night sweats in particular, leading to treatment with fludarabine, Cytoxan and rituxan for five cycles given between December 2006 and May 2007.  We have CT scans from June 2006, November 2006 and April 2007, and comparing the November 2006 and April 2007 scans, there was near complete response. He had subsequent therapy in [place] as detailed below, but with decreased response, leading to allogeneic stem-cell transplant at the [facility] 02/24/2012.  INTERVAL HISTORY: Timothy [name] returns with his wife Timothy [name]  for followup of his CLL, status post allogeneic stem cell transplant in [place] in March 2013.   Timothy [name] continues on oral vancomycin in addition to Naselle, and his diarrhea continues to improve. The stools are more formed less frequent, and he had only 3 bowel movements yesterday (previously 5-6 times daily). He denies any nausea or emesis, abdominal pain, fevers or chills and has noted no blood or mucous in the stools.  Timothy [name] continues to follow his blood sugars closely, and most of the time they are below 200. On occasion they have been higher, but this seems to be directly related to something he has eaten. His blood pressures remain elevated despite beginning on Carvedilol last week.  He continues on labetalol, twice daily.   Otherwise, he has had some problems sleeping. His appetite has increased.  He continues to have some tremors, and these increased slightly when reducing the  prednisone from 50-40 mg daily. They have "compromised" with a dose of 45 mg daily which we will continue for now.    REVIEW OF SYSTEMS: Otherwise, Timothy [name] is feeling well. He has noted no new rashes or skin changes.  He has noted no mouth ulcers or oral sensitivity. He denies any new cough. He has shortness of breath with exertion, but this is stable. He's had no chest pain or palpitations. He denies any abnormal headaches or dizziness. He currently denies any unusual myalgias, arthralgias, bony pain, or excessive swelling.  A detailed review of systems is otherwise stable and noncontributory.  PAST MEDICAL HISTORY: Past Medical History  Diagnosis Date  . 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 Diverticular disease   . Hyperlipidemia   . Obesity   . Hypertension   . Hiatal hernia   . CMV (cytomegalovirus) antibody positive     pre-transplant, with seroconversion x2 pst-transplant  . Right bundle branch block     pre-transplant  . CKD (chronic kidney disease) stage 2, GFR 60-89 ml/min   . Pancytopenia   . Steroid-induced diabetes   . Atrial fibrillation     post-transplant  . Myopathy   . 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 Leukemia, chronic lymphoid   . 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 Diarrhea in adult patient 12/05/2012    Due to active GVHD  . CLL (chronic lymphocytic leukemia) 12/05/2012    Dx 07/1999; started Rx 12/06  AlloBMT 3/13  . Rash of face 12/05/2012  Due to GVHD  . Hypomagnesemia 01/26/2013    PAST SURGICAL HISTORY: Past Surgical History  Procedure Laterality Date  . Tonsillectomy and adenoidectomy    . Bone marrow transplant    . Flexible sigmoidoscopy  11/17/2012    Procedure: FLEXIBLE SIGMOIDOSCOPY;  Surgeon: Petra Kuba, MD;  Location: [facility];  Service: Endoscopy;  Laterality: N/A;  Dr Ewing Schlein states will be admitted to rooom 1339  11/16/12  . Esophagogastroduodenoscopy  11/17/2012    Procedure: ESOPHAGOGASTRODUODENOSCOPY (EGD);  Surgeon: Petra Kuba, MD;  Location: [facility];  Service: Endoscopy;  Laterality: N/A;    FAMILY HISTORY Family History  Problem Relation Age of Onset  . Cancer Father    The patient's father died from complications of chronic lymphocytic leukemia at the age of 26.  It had been diagnosed seven years before when he was 51.  The patient is enrolled in a familial chronic lymphocytic leukemia study out of the [facility].  The patient's mother is 10, alive, unfortunately suffering with dementia, and he has a brother, 40, who is otherwise in fair health.   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  His wife of >40 years, Timothy [name], is a homemaker.  Their daughter, Timothy [name], lives in [place].  She also is a Futures trader.  The patient has an 6 year old grandson and an 68-year-old granddaughter, and that is really the main reason he moved to this area.  He is a International aid/development worker.     ADVANCED DIRECTIVES: In place  HEALTH MAINTENANCE: History  Substance Use Topics  . Smoking status: Never Smoker   . Smokeless tobacco: Never Used  . Alcohol Use: No     Colonoscopy:  PSA:  Bone density:  Lipid panel:  Allergies  Allergen Reactions  . Benadryl (Diphenhydramine Hcl)     "Restless leg syndrome"    Current Outpatient Prescriptions  Medication Sig Dispense Refill  . acyclovir (ZOVIRAX) 400 MG tablet Take 2 tablets (800 mg total) by mouth 2 (two) times daily.  60 tablet  12  . antiseptic oral rinse (BIOTENE) LIQD 15 mLs by Mouth Rinse route 2 times daily at 12 noon and 4 pm.  237 mL  6  . B-D UF III MINI PEN NEEDLES 31G X 5 MM MISC       . Blood Glucose Monitoring Suppl (ONE TOUCH ULTRA 2) W/DEVICE KIT       . budesonide (ENTOCORT EC) 3 MG 24 hr capsule Take 9 mg by  mouth 3 (three) times daily.      . Calcium Citrate (CITRACAL PO) Take 1,200 mg by mouth 2 (two) times daily.      . cholecalciferol (VITAMIN D) 1000 UNITS tablet Take 1,000 Units by mouth every evening.       . cholestyramine (QUESTRAN) 4 G packet Take 1 packet by mouth 2 (two) times daily with a meal.  60 each  1  . diltiazem (CARDIZEM LA) 180 MG 24 hr tablet Take 1 tablet (180 mg total) by mouth daily.  30 tablet  3  . diphenoxylate-atropine (LOMOTIL) 2.5-0.025 MG per tablet       . dronabinol (MARINOL) 2.5 MG capsule Take 1 capsule (2.5 mg total) by mouth 2 (two) times daily before lunch and supper.  60 capsule  0  . fluconazole (DIFLUCAN) 100 MG tablet Take 1 tablet (100 mg total) by mouth every morning.  30 tablet  12  . glucose blood (ONE TOUCH ULTRA TEST) test strip As directed  150 each  6  . HYDROcodone-acetaminophen (NORCO) 10-325 MG per tablet Take 1 tablet by mouth daily as needed for pain (may take 1/2 to 1 tablet in the am as needed for pain).  30 tablet  0  . insulin aspart protamine-insulin aspart (NOVOLOG 70/30) (70-30) 100 UNIT/ML injection Inject 18 Units into the skin daily with breakfast.      . insulin aspart protamine-insulin aspart (NOVOLOG 70/30) (70-30) 100 UNIT/ML injection Inject 9 Units into the skin daily with supper.      . labetalol (NORMODYNE) 200 MG tablet Take 1 tablet (200 mg total) by mouth 2 (two) times daily.  60 tablet  12  . Lidocaine-Hydrocortisone Ace 3-0.5 % KIT       . Multiple Vitamins-Minerals (CENTRUM SILVER ADULT 50+) TABS Take 1 tablet by mouth every evening.       . mupirocin ointment (BACTROBAN) 2 %       . nateglinide (STARLIX) 120 MG tablet Take 1 tablet (120 mg total) by mouth 3 (three) times daily before meals.  90 tablet  11  . Nutritional Supplements (FEEDING SUPPLEMENT, VITAL 1.5 CAL,) LIQD Take 237 mLs by mouth 2 (two) times daily.  24 Can  12  . omeprazole (PRILOSEC) 20 MG capsule Take 20 mg by mouth daily.      Letta Pate DELICA  LANCETS 33G MISC 33 g by Does not apply route as directed.  200 each  6  . predniSONE (DELTASONE) 10 MG tablet Take 1 tablet (10 mg total) by mouth daily.  30 tablet  2  . predniSONE (DELTASONE) 20 MG tablet Take 2 tablets (40 mg total) by mouth daily.  60 tablet  2  . sertraline (ZOLOFT) 50 MG tablet Takes 50 mg one day and 100 mg the next day  90 tablet  3  . Sodium Chloride Flush (NORMAL SALINE FLUSH) 0.9 % SOLN       . sulfamethoxazole-trimethoprim (BACTRIM DS) 800-160 MG per tablet Take 1 tablet by mouth daily.  30 tablet  11  . tacrolimus (PROGRAF) 0.5 MG capsule Take 1.5 mg by mouth 2 (two) times daily.      Marland Kitchen testosterone (ANDRODERM) 4 MG/24HR PT24 patch Place 1 patch onto the skin daily.  30 patch  3  . vancomycin (VANCOCIN) 125 MG capsule Take 1 capsule (125 mg total) by mouth 4 (four) times daily.  52 capsule  3  . zinc gluconate 50 MG tablet Take 50 mg by mouth daily.       No current facility-administered medications for this visit.   Facility-Administered Medications Ordered in Other Visits  Medication Dose Route Frequency Provider Last Rate Last Dose  . 0.9 %  sodium chloride infusion   Intravenous Continuous Kaytelynn Scripter G Nikoletta Varma, PA-C 500 mL/hr at 03/12/13 0900    . sodium chloride 0.9 % injection 10 mL  10 mL Intravenous PRN Lowella Dell, MD   10 mL at 08/11/12 1606    OBJECTIVE: Middle-aged white male in no acute distress. Filed Vitals:   04/27/13 1530  BP: 174/94  Pulse: 83  Temp: 98.4 F (36.9 C)  Resp: 20     Body mass index is 27.15 kg/(m^2).   Filed Weights   04/27/13 1530  Weight: 173 lb 6.4 oz (78.654 kg)   ECOG FS: 2  Face shows no rash Sclerae unicteric Left conjunctiva is erythematous, consistent with conjunctival hemmorhage  Lungs no rales or rhonchi Heart regular rate and rhythm Abdomen  soft, nontender, positive bowel sounds Minimal bilateral lower extremity edema, nonpitting Neuro: nonfocal, well oriented, positive affect Multiple ecchymoses noted  on the hands and upper extremities    LAB RESULTS:  CBC    Component Value Date/Time   WBC 6.0 04/27/2013 1515   WBC 5.4 03/18/2013 0615   RBC 3.30* 04/27/2013 1515   RBC 3.17* 03/18/2013 0615   HGB 10.4* 04/27/2013 1515   HGB 9.9* 03/18/2013 0615   HCT 31.7* 04/27/2013 1515   HCT 28.8* 03/18/2013 0615   PLT 101* 04/27/2013 1515   PLT 92* 03/18/2013 0615   MCV 96.1 04/27/2013 1515   MCV 90.9 03/18/2013 0615   MCH 31.5 04/27/2013 1515   MCH 31.2 03/18/2013 0615   MCHC 32.8 04/27/2013 1515   MCHC 34.4 03/18/2013 0615   RDW 17.3* 04/27/2013 1515   RDW 14.7 03/18/2013 0615   LYMPHSABS 1.1 04/27/2013 1515   LYMPHSABS 1.4 03/18/2013 0615   MONOABS 0.2 04/27/2013 1515   MONOABS 0.3 03/18/2013 0615   EOSABS 0.0 04/27/2013 1515   EOSABS 0.0 03/18/2013 0615   BASOSABS 0.0 04/27/2013 1515   BASOSABS 0.0 03/18/2013 0615        Chemistry      Component Value Date/Time   NA 136 04/25/2013 1220   NA 134* 04/18/2013 1218   K 5.2* 04/25/2013 1220   K 4.9 04/18/2013 1218   CL 109* 04/25/2013 1220   CL 106 04/18/2013 1218   CO2 18* 04/25/2013 1220   CO2 21 04/18/2013 1218   BUN 48.9* 04/25/2013 1220   BUN 44* 04/18/2013 1218   CREATININE 1.1 04/25/2013 1220   CREATININE 0.95 04/18/2013 1218      Component Value Date/Time   CALCIUM 8.7 04/25/2013 1220   CALCIUM 9.0 04/18/2013 1218   ALKPHOS 85 04/25/2013 1220   ALKPHOS 75 04/18/2013 1218   AST 31 04/25/2013 1220   AST 24 04/18/2013 1218   ALT 34 04/25/2013 1220   ALT 33 04/18/2013 1218   BILITOT 0.46 04/25/2013 1220   BILITOT 0.2* 04/18/2013 1218     Magnesium  1.9  04/20/2013    1.8  04/18/2013    2.2  04/13/2013  IgG    99  04/18/2013    308  03/14/2013    124  02/24/2013   STUDIES:  No results found.    ASSESSMENT: 67 y.o. [place] man with a history of well-differentiated lymphocytic lymphoma/ chronic lymphoid leukemia initially diagnosed in 2000, not requiring intervention until 2006; with multiple chromosomal abnormalities.  His treatment history is as  follows:  (1) fludarabine/cyclophosphamide/rituximab x5 completed May 2007.   (2) rituximab for 8 doses October 2010, with partial response   (3) Leustatin and ofatumumab weekly x8 July to September 2011 followed by maintenance ofatumumab maintenance ofatumumab every 2 months, with initial response but rising counts September 2012   (4) status-post unrelated donor stem-cell transplant 02/24/2012 at [facility]  (a) conditioning regimen consisted of fludarabine + TBI at 200 cGy, followed by rituximab x27;  (b) CMV reactivation x3 (patient CMV positive, donor negative), s/p ganciclovir treatment; 3d reactivation August 2013, s/p gancyclovir, with negative PCR mid-September 2013; last gancyclovir dose 10/06/2012 (c) Chronic GVHD: involving gut and skin, treated with steroids, tacrolimus and MMF.  MMF was eventually d/c'd and tacrolimus currently at a dose of 1.5mg  BID (d) atrial fibrillation: resolved on brief amiodarone regimen (e) steroid-induced myopathy: improving  (f) hypomagnesemia: improved after d/c  gancyclovir (g) hypogammaglobulinemia: s/p IVIG most recently 01/07/2013. (h) history of elevated triglycerides (606 on 07/14/2012)  (i) adrenal insufficiency: on prednisone and budesonide (j) pancytopenia, improved  (5) restaging studies September 2013  including CT scans, flow cytometry, and bone marrow biopsy, showed no evidence of residual chronic lymphoid leukemia.  (6) recurrent GVHD (skin rash, mouth changes, severe diarrhea and gastric/duodenal/colonic biopsies 11/17/2012 c/w GVHD grade 2) now only remaining sign is bothersome diarrhea; c diff negative x3; continuing current regimen   (7) severe malnutrition -- on VITAL supplement in addition to regular diet; on Marinol for anorexia  (8) testosterone deficiency--on patch   (9) deconditioning: ongoing REHAB   (10) mild dehydration: encouraged increased po fluids; receives IVF support w magnesium three times weekly  (11)  steroid-induced osteoporosis with compression fractures: received pamidronate 12/18/2012   (12) nausea: improved on current meds  (13)  Positive c.diff, 03/08/2013, on Flagyl 500 mg TID x 20 days, then on oral vanco with Questran, showing improvement  (14) hyperkalemia, improving  (15)  Hypertension, on labetalol and cardizem  (16) steroid induced hyperglycemia, on sterlix and 70/30 insulin  PLAN: Aksel will continue receiving IV fluids with magnesium 3 times weekly as before.  He'll continue on oral vanc and Questran for diarrhea.  He will continue to monitor his blood pressure and will continue on the current dose of Cardizem at 180 mg daily.  If by next Wednesday, his pressures are still elevated, we will consider increasing this to 240 mg daily. He will continue on his currentl dose of Prednisone at 45 mg daily, and we will continue to follow his glucose very closely.  He would like to try stopping the Marinol which is fine now that his appetite has returned to normal.   For financial reasons, Braelyn would like to see [name] a little less frequently, so I will plan on seeing him every other week for physical exam and follow up.  Of course he will continue to come in 3 times weekly for labs and IV fluids, and we will be more than glad to see hime sooner if necessary.  Of course, he and Timothy [name] both know to call if he has any changes or problems.   Zollie Scale    04/27/2013

## 2013-04-28 ENCOUNTER — Telehealth: Payer: Self-pay | Admitting: *Deleted

## 2013-04-29 ENCOUNTER — Other Ambulatory Visit: Payer: Self-pay | Admitting: *Deleted

## 2013-04-29 ENCOUNTER — Other Ambulatory Visit: Payer: Self-pay | Admitting: Oncology

## 2013-04-29 ENCOUNTER — Ambulatory Visit (HOSPITAL_BASED_OUTPATIENT_CLINIC_OR_DEPARTMENT_OTHER): Payer: BC Managed Care – PPO

## 2013-04-29 DIAGNOSIS — C9110 Chronic lymphocytic leukemia of B-cell type not having achieved remission: Secondary | ICD-10-CM

## 2013-04-29 MED ORDER — SODIUM CHLORIDE 0.9 % IJ SOLN
10.0000 mL | INTRAMUSCULAR | Status: DC | PRN
  Administered 2013-04-29: 10 mL via INTRAVENOUS
  Filled 2013-04-29: qty 10

## 2013-04-29 MED ORDER — SODIUM CHLORIDE 0.9 % IV SOLN
Freq: Once | INTRAVENOUS | Status: AC
  Administered 2013-04-29: 09:00:00 via INTRAVENOUS
  Filled 2013-04-29: qty 500

## 2013-04-29 MED ORDER — HYDROCHLOROTHIAZIDE 25 MG PO TABS: 25.0000 mg | ORAL_TABLET | Freq: Every day | ORAL | Status: DC

## 2013-04-29 MED ORDER — HEPARIN SOD (PORK) LOCK FLUSH 100 UNIT/ML IV SOLN
500.0000 [IU] | Freq: Once | INTRAVENOUS | Status: AC
  Administered 2013-04-29: 250 [IU] via INTRAVENOUS
  Filled 2013-04-29: qty 5

## 2013-04-29 NOTE — Telephone Encounter (Signed)
This RN spoke with pt per MD recommendation for HCTZ due to increasing K+.  Pt verbalized understanding .  Prescription escribed to pharmacy.

## 2013-04-29 NOTE — Patient Instructions (Addendum)
Hypomagnesemia Magnesium is a common ion (mineral) in the body which is needed for metabolism. It is about how the body handles food and other chemical reactions necessary for life. Only about 2% of the magnesium in our body is found in the blood. When this is low, it is called hypomagnesemia. The blood will measure only a tiny amount of the magnesium in our body. When it is low in our blood, it does not mean that the whole body supply is low. The normal serum concentration ranges from 1.8-2.5 mEq/L. When the level gets to be less than 1.0 mEq/L, a number of problems begin to happen.  CAUSES   Receiving intravenous fluids without magnesium replacement.  Loss of magnesium from the bowel by naso-gastric suction.  Loss of magnesium from nausea and vomiting or severe diarrhea. Any of the inflammatory bowel conditions can cause this.  Abuse of alcohol often leads to low serum magnesium.  An inherited form of magnesium loss happens when the kidneys lose magnesium. This is called familial or primary hypomagnesemia.  Some medications such as diuretics also cause the loss of magnesium. SYMPTOMS  These following problems are worse if the changes in magnesium levels come on suddenly.  Tremor.  Confusion.  Muscle weakness.  Over-sensitive to sights and sounds.  Sensitive reflexes.  Depression.  Muscular fibrillations.  Over-reactivity of the nerves.  Irritability.  Psychosis.  Spasms of the hand muscles.  Tetany (where the muscles go into uncontrollable spasms). DIAGNOSIS  This condition can be diagnosed by blood tests. TREATMENT   In emergency, magnesium can be given intravenously (by vein).  If the condition is less worrisome, it can be corrected by diet. High levels of magnesium are found in green leafy vegetables, peas, beans and nuts among other things. It can also be given through medications by mouth.  If it is being caused by medications, changes can be made.  If  alcohol is a problem, help is available if there are difficulties giving it up. Document Released: 09/03/2005 Document Revised: 03/01/2012 Document Reviewed: 07/28/2008 Exit[employer] Patient Information 2013 [employer].  

## 2013-05-02 ENCOUNTER — Ambulatory Visit: Payer: Self-pay | Admitting: Family

## 2013-05-02 ENCOUNTER — Other Ambulatory Visit: Payer: Self-pay | Admitting: Lab

## 2013-05-02 ENCOUNTER — Ambulatory Visit (HOSPITAL_BASED_OUTPATIENT_CLINIC_OR_DEPARTMENT_OTHER): Payer: BC Managed Care – PPO

## 2013-05-02 ENCOUNTER — Other Ambulatory Visit: Payer: Self-pay | Admitting: Oncology

## 2013-05-02 MED ORDER — SODIUM CHLORIDE 0.9 % IJ SOLN
10.0000 mL | INTRAMUSCULAR | Status: DC | PRN
  Administered 2013-05-02: 10 mL via INTRAVENOUS
  Filled 2013-05-02: qty 10

## 2013-05-02 MED ORDER — HEPARIN SOD (PORK) LOCK FLUSH 100 UNIT/ML IV SOLN
500.0000 [IU] | Freq: Once | INTRAVENOUS | Status: AC
  Administered 2013-05-02: 500 [IU] via INTRAVENOUS
  Filled 2013-05-02: qty 5

## 2013-05-02 MED ORDER — SODIUM CHLORIDE 0.9 % IV SOLN
Freq: Once | INTRAVENOUS | Status: AC
  Administered 2013-05-02: 12:00:00 via INTRAVENOUS
  Filled 2013-05-02: qty 500

## 2013-05-02 NOTE — Progress Notes (Signed)
1.5 inches from insertion point to stitch.  Small area of redness above insertion point.  Wife states not any worse.  No drainage.  Pt reports no temps, pain, heat, chills.

## 2013-05-04 ENCOUNTER — Other Ambulatory Visit: Payer: Self-pay | Admitting: *Deleted

## 2013-05-04 ENCOUNTER — Ambulatory Visit (HOSPITAL_BASED_OUTPATIENT_CLINIC_OR_DEPARTMENT_OTHER): Payer: BC Managed Care – PPO

## 2013-05-04 DIAGNOSIS — C9110 Chronic lymphocytic leukemia of B-cell type not having achieved remission: Secondary | ICD-10-CM

## 2013-05-04 MED ORDER — SODIUM CHLORIDE 0.9 % IJ SOLN
10.0000 mL | INTRAMUSCULAR | Status: DC | PRN
  Administered 2013-05-04: 10 mL via INTRAVENOUS
  Filled 2013-05-04: qty 10

## 2013-05-04 MED ORDER — SODIUM CHLORIDE 0.9 % IV SOLN
Freq: Once | INTRAVENOUS | Status: AC
  Administered 2013-05-04: 14:00:00 via INTRAVENOUS
  Filled 2013-05-04: qty 500

## 2013-05-04 MED ORDER — INSULIN ASPART PROT & ASPART (70-30 MIX) 100 UNIT/ML ~~LOC~~ SUSP: 9.0000 [IU] | Freq: Every day | SUBCUTANEOUS | Status: DC

## 2013-05-04 MED ORDER — OMEPRAZOLE 20 MG PO CPDR: 20.0000 mg | DELAYED_RELEASE_CAPSULE | Freq: Every day | ORAL | Status: DC

## 2013-05-04 MED ORDER — INSULIN ASPART PROT & ASPART (70-30 MIX) 100 UNIT/ML ~~LOC~~ SUSP: 18.0000 [IU] | Freq: Every day | SUBCUTANEOUS | Status: DC

## 2013-05-04 MED ORDER — HEPARIN SOD (PORK) LOCK FLUSH 100 UNIT/ML IV SOLN
250.0000 [IU] | Freq: Once | INTRAVENOUS | Status: AC
  Administered 2013-05-04: 15:00:00
  Filled 2013-05-04: qty 5

## 2013-05-04 NOTE — Patient Instructions (Signed)
Hypomagnesemia Magnesium is a common ion (mineral) in the body which is needed for metabolism. It is about how the body handles food and other chemical reactions necessary for life. Only about 2% of the magnesium in our body is found in the blood. When this is low, it is called hypomagnesemia. The blood will measure only a tiny amount of the magnesium in our body. When it is low in our blood, it does not mean that the whole body supply is low. The normal serum concentration ranges from 1.8-2.5 mEq/L. When the level gets to be less than 1.0 mEq/L, a number of problems begin to happen.  CAUSES   Receiving intravenous fluids without magnesium replacement.  Loss of magnesium from the bowel by naso-gastric suction.  Loss of magnesium from nausea and vomiting or severe diarrhea. Any of the inflammatory bowel conditions can cause this.  Abuse of alcohol often leads to low serum magnesium.  An inherited form of magnesium loss happens when the kidneys lose magnesium. This is called familial or primary hypomagnesemia.  Some medications such as diuretics also cause the loss of magnesium. SYMPTOMS  These following problems are worse if the changes in magnesium levels come on suddenly.  Tremor.  Confusion.  Muscle weakness.  Over-sensitive to sights and sounds.  Sensitive reflexes.  Depression.  Muscular fibrillations.  Over-reactivity of the nerves.  Irritability.  Psychosis.  Spasms of the hand muscles.  Tetany (where the muscles go into uncontrollable spasms). DIAGNOSIS  This condition can be diagnosed by blood tests. TREATMENT   In emergency, magnesium can be given intravenously (by vein).  If the condition is less worrisome, it can be corrected by diet. High levels of magnesium are found in green leafy vegetables, peas, beans and nuts among other things. It can also be given through medications by mouth.  If it is being caused by medications, changes can be made.  If  alcohol is a problem, help is available if there are difficulties giving it up. Document Released: 09/03/2005 Document Revised: 03/01/2012 Document Reviewed: 07/28/2008 [employer] Patient Information 2013 [employer].  

## 2013-05-04 NOTE — Progress Notes (Signed)
Verbal confirmation received from Laurell, RN with long term follow up with the [facility] in [place], [place] stating that all patients that have Hickman lines placed at their facility do have a cuff in place.  She was unable to locate original radiology report to fax here.  Deana, RN changed dressing to pt's Hickman today and last week, and states the site and tubing looks the same as it did last week.  Pt reports no pain or discomfort at site or in neck, no swelling noted.  Pt and wife know to call office if any problems develop.

## 2013-05-06 ENCOUNTER — Other Ambulatory Visit: Payer: Self-pay | Admitting: *Deleted

## 2013-05-06 ENCOUNTER — Ambulatory Visit (HOSPITAL_BASED_OUTPATIENT_CLINIC_OR_DEPARTMENT_OTHER): Payer: BC Managed Care – PPO

## 2013-05-06 ENCOUNTER — Ambulatory Visit (HOSPITAL_BASED_OUTPATIENT_CLINIC_OR_DEPARTMENT_OTHER): Payer: BC Managed Care – PPO | Admitting: Lab

## 2013-05-06 DIAGNOSIS — K449 Diaphragmatic hernia without obstruction or gangrene: Secondary | ICD-10-CM

## 2013-05-06 DIAGNOSIS — C9110 Chronic lymphocytic leukemia of B-cell type not having achieved remission: Secondary | ICD-10-CM

## 2013-05-06 DIAGNOSIS — R21 Rash and other nonspecific skin eruption: Secondary | ICD-10-CM

## 2013-05-06 DIAGNOSIS — D89811 Chronic graft-versus-host disease: Secondary | ICD-10-CM

## 2013-05-06 DIAGNOSIS — I4891 Unspecified atrial fibrillation: Secondary | ICD-10-CM

## 2013-05-06 DIAGNOSIS — D61818 Other pancytopenia: Secondary | ICD-10-CM

## 2013-05-06 DIAGNOSIS — R112 Nausea with vomiting, unspecified: Secondary | ICD-10-CM

## 2013-05-06 DIAGNOSIS — I10 Essential (primary) hypertension: Secondary | ICD-10-CM

## 2013-05-06 DIAGNOSIS — A0472 Enterocolitis due to Clostridium difficile, not specified as recurrent: Secondary | ICD-10-CM

## 2013-05-06 DIAGNOSIS — R894 Abnormal immunological findings in specimens from other organs, systems and tissues: Secondary | ICD-10-CM

## 2013-05-06 DIAGNOSIS — R768 Other specified abnormal immunological findings in serum: Secondary | ICD-10-CM

## 2013-05-06 LAB — CBC WITH DIFFERENTIAL/PLATELET
Basophils Absolute: 0 10*3/uL (ref 0.0–0.1)
EOS%: 0.1 % (ref 0.0–7.0)
Eosinophils Absolute: 0 10*3/uL (ref 0.0–0.5)
HCT: 28 % — ABNORMAL LOW (ref 38.4–49.9)
HGB: 9.5 g/dL — ABNORMAL LOW (ref 13.0–17.1)
MCH: 32.7 pg (ref 27.2–33.4)
NEUT#: 3.2 10*3/uL (ref 1.5–6.5)
NEUT%: 63.5 % (ref 39.0–75.0)
lymph#: 1.5 10*3/uL (ref 0.9–3.3)

## 2013-05-06 LAB — COMPREHENSIVE METABOLIC PANEL (CC13)
ALT: 30 U/L (ref 0–55)
AST: 27 U/L (ref 5–34)
Calcium: 8.4 mg/dL (ref 8.4–10.4)
Chloride: 104 mEq/L (ref 98–107)
Creatinine: 0.9 mg/dL (ref 0.7–1.3)
Total Bilirubin: 0.36 mg/dL (ref 0.20–1.20)

## 2013-05-06 LAB — TECHNOLOGIST REVIEW

## 2013-05-06 MED ORDER — SODIUM CHLORIDE 0.9 % IV SOLN
Freq: Once | INTRAVENOUS | Status: AC
  Administered 2013-05-06: 12:00:00 via INTRAVENOUS
  Filled 2013-05-06: qty 500

## 2013-05-06 MED ORDER — DILTIAZEM HCL ER COATED BEADS 240 MG PO CP24: 240.0000 mg | ORAL_CAPSULE | Freq: Every day | ORAL | Status: DC

## 2013-05-06 MED ORDER — HEPARIN SOD (PORK) LOCK FLUSH 100 UNIT/ML IV SOLN
500.0000 [IU] | INTRAVENOUS | Status: AC | PRN
  Administered 2013-05-06: 250 [IU]
  Filled 2013-05-06: qty 5

## 2013-05-06 MED ORDER — SODIUM CHLORIDE 0.9 % IJ SOLN
10.0000 mL | INTRAMUSCULAR | Status: AC | PRN
  Administered 2013-05-06: 10 mL
  Filled 2013-05-06: qty 10

## 2013-05-06 MED ORDER — SODIUM CHLORIDE 0.9 % IV SOLN
Freq: Once | INTRAVENOUS | Status: AC
  Administered 2013-05-06: 12:00:00 via INTRAVENOUS

## 2013-05-06 MED ORDER — HEPARIN SOD (PORK) LOCK FLUSH 100 UNIT/ML IV SOLN
250.0000 [IU] | INTRAVENOUS | Status: AC | PRN
  Administered 2013-05-06: 250 [IU]
  Filled 2013-05-06: qty 5

## 2013-05-06 MED ORDER — SODIUM CHLORIDE 0.9 % IJ SOLN
10.0000 mL | INTRAMUSCULAR | Status: DC | PRN
  Filled 2013-05-06: qty 10

## 2013-05-06 NOTE — Progress Notes (Signed)
1200-Pt.'s BP-168/84 today in infusion room.  Pt concerned regarding recent increase in BP. Jacqulyn Ducking RN notified and Cardizem will be increased to 240 mg. Daily per A. [name] NP.  Pt and pt.'s wife notified and will pick up new prescription after infusion room visit today.  Patient with no questions at this time.

## 2013-05-06 NOTE — Patient Instructions (Signed)
Hypomagnesemia Magnesium is a common ion (mineral) in the body which is needed for metabolism. It is about how the body handles food and other chemical reactions necessary for life. Only about 2% of the magnesium in our body is found in the blood. When this is low, it is called hypomagnesemia. The blood will measure only a tiny amount of the magnesium in our body. When it is low in our blood, it does not mean that the whole body supply is low. The normal serum concentration ranges from 1.8-2.5 mEq/L. When the level gets to be less than 1.0 mEq/L, a number of problems begin to happen.  CAUSES   Receiving intravenous fluids without magnesium replacement.  Loss of magnesium from the bowel by naso-gastric suction.  Loss of magnesium from nausea and vomiting or severe diarrhea. Any of the inflammatory bowel conditions can cause this.  Abuse of alcohol often leads to low serum magnesium.  An inherited form of magnesium loss happens when the kidneys lose magnesium. This is called familial or primary hypomagnesemia.  Some medications such as diuretics also cause the loss of magnesium. SYMPTOMS  These following problems are worse if the changes in magnesium levels come on suddenly.  Tremor.  Confusion.  Muscle weakness.  Over-sensitive to sights and sounds.  Sensitive reflexes.  Depression.  Muscular fibrillations.  Over-reactivity of the nerves.  Irritability.  Psychosis.  Spasms of the hand muscles.  Tetany (where the muscles go into uncontrollable spasms). DIAGNOSIS  This condition can be diagnosed by blood tests. TREATMENT   In emergency, magnesium can be given intravenously (by vein).  If the condition is less worrisome, it can be corrected by diet. High levels of magnesium are found in green leafy vegetables, peas, beans and nuts among other things. It can also be given through medications by mouth.  If it is being caused by medications, changes can be made.  If  alcohol is a problem, help is available if there are difficulties giving it up. Document Released: 09/03/2005 Document Revised: 03/01/2012 Document Reviewed: 07/28/2008 [employer] Patient Information 2013 [employer].  

## 2013-05-07 LAB — IGG, IGA, IGM: IgM, Serum: <5 mg/dL — ABNORMAL LOW (ref 41–251)

## 2013-05-09 ENCOUNTER — Ambulatory Visit (HOSPITAL_COMMUNITY)
Admission: RE | Admit: 2013-05-09 | Discharge: 2013-05-09 | Disposition: A | Discharge status: Home / Self Care | Payer: BC Managed Care – PPO | Source: Ambulatory Visit | Attending: Physician Assistant | Admitting: Physician Assistant

## 2013-05-09 ENCOUNTER — Other Ambulatory Visit: Payer: Self-pay

## 2013-05-09 ENCOUNTER — Ambulatory Visit (HOSPITAL_BASED_OUTPATIENT_CLINIC_OR_DEPARTMENT_OTHER): Payer: BC Managed Care – PPO

## 2013-05-09 ENCOUNTER — Other Ambulatory Visit: Payer: Self-pay | Admitting: Physician Assistant

## 2013-05-09 ENCOUNTER — Encounter: Payer: Self-pay | Admitting: Physician Assistant

## 2013-05-09 ENCOUNTER — Telehealth: Payer: Self-pay | Admitting: Medical Oncology

## 2013-05-09 DIAGNOSIS — Z9489 Other transplanted organ and tissue status: Secondary | ICD-10-CM

## 2013-05-09 DIAGNOSIS — S22009A Unspecified fracture of unspecified thoracic vertebra, initial encounter for closed fracture: Secondary | ICD-10-CM | POA: Insufficient documentation

## 2013-05-09 DIAGNOSIS — C9110 Chronic lymphocytic leukemia of B-cell type not having achieved remission: Secondary | ICD-10-CM

## 2013-05-09 DIAGNOSIS — M545 Low back pain, unspecified: Secondary | ICD-10-CM

## 2013-05-09 DIAGNOSIS — E785 Hyperlipidemia, unspecified: Secondary | ICD-10-CM

## 2013-05-09 DIAGNOSIS — S32009A Unspecified fracture of unspecified lumbar vertebra, initial encounter for closed fracture: Secondary | ICD-10-CM | POA: Insufficient documentation

## 2013-05-09 DIAGNOSIS — N189 Chronic kidney disease, unspecified: Secondary | ICD-10-CM | POA: Insufficient documentation

## 2013-05-09 DIAGNOSIS — I129 Hypertensive chronic kidney disease with stage 1 through stage 4 chronic kidney disease, or unspecified chronic kidney disease: Secondary | ICD-10-CM | POA: Insufficient documentation

## 2013-05-09 DIAGNOSIS — M79609 Pain in unspecified limb: Secondary | ICD-10-CM | POA: Insufficient documentation

## 2013-05-09 DIAGNOSIS — I10 Essential (primary) hypertension: Secondary | ICD-10-CM

## 2013-05-09 DIAGNOSIS — D809 Immunodeficiency with predominantly antibody defects, unspecified: Secondary | ICD-10-CM

## 2013-05-09 DIAGNOSIS — M47817 Spondylosis without myelopathy or radiculopathy, lumbosacral region: Secondary | ICD-10-CM | POA: Insufficient documentation

## 2013-05-09 DIAGNOSIS — IMO0002 Reserved for concepts with insufficient information to code with codable children: Secondary | ICD-10-CM | POA: Insufficient documentation

## 2013-05-09 DIAGNOSIS — R29898 Other symptoms and signs involving the musculoskeletal system: Secondary | ICD-10-CM | POA: Insufficient documentation

## 2013-05-09 DIAGNOSIS — D89811 Chronic graft-versus-host disease: Secondary | ICD-10-CM

## 2013-05-09 DIAGNOSIS — Z9481 Bone marrow transplant status: Secondary | ICD-10-CM | POA: Insufficient documentation

## 2013-05-09 DIAGNOSIS — X58XXXA Exposure to other specified factors, initial encounter: Secondary | ICD-10-CM | POA: Insufficient documentation

## 2013-05-09 DIAGNOSIS — C9590 Leukemia, unspecified not having achieved remission: Secondary | ICD-10-CM | POA: Insufficient documentation

## 2013-05-09 DIAGNOSIS — M7989 Other specified soft tissue disorders: Secondary | ICD-10-CM | POA: Insufficient documentation

## 2013-05-09 MED ORDER — SODIUM CHLORIDE 0.9 % IJ SOLN
10.0000 mL | INTRAMUSCULAR | Status: DC | PRN
  Administered 2013-05-09: 10 mL via INTRAVENOUS
  Filled 2013-05-09: qty 10

## 2013-05-09 MED ORDER — FUROSEMIDE 20 MG PO TABS: 20.0000 mg | ORAL_TABLET | ORAL | Status: DC

## 2013-05-09 MED ORDER — HEPARIN SOD (PORK) LOCK FLUSH 100 UNIT/ML IV SOLN
500.0000 [IU] | Freq: Once | INTRAVENOUS | Status: AC
  Administered 2013-05-09: 250 [IU] via INTRAVENOUS
  Filled 2013-05-09: qty 5

## 2013-05-09 MED ORDER — OXYCODONE-ACETAMINOPHEN 5-325 MG PO TABS
2.0000 | ORAL_TABLET | Freq: Once | ORAL | Status: AC
  Administered 2013-05-09: 2 via ORAL

## 2013-05-09 MED ORDER — SODIUM CHLORIDE 0.9 % IV SOLN
Freq: Once | INTRAVENOUS | Status: AC
  Administered 2013-05-09: 15:00:00 via INTRAVENOUS
  Filled 2013-05-09: qty 500

## 2013-05-09 MED ORDER — GADOBENATE DIMEGLUMINE 529 MG/ML IV SOLN
15.0000 mL | Freq: Once | INTRAVENOUS | Status: AC | PRN
  Administered 2013-05-09: 15 mL via INTRAVENOUS

## 2013-05-09 NOTE — Progress Notes (Signed)
I spoke with teary today while in the infusion room receiving his IV fluids. He has had increased back pain. While this is a chronic problem, he feels like it has worsened over the last several days. It is affecting his lower back. When he first stands up he describes this as a shooting pain, but then becomes a dog take with walking or with sitting. He is taken 1 tablet of hydrocodone/APAP today with some relief for approximately one hour. This pain, thus far, does not radiate down into the lower extremities.  He is also continue to have edema in both of the lower extremities. It is equal bilaterally. His blood pressure remains elevated. Last night at home, the diastolic was up to 106. He was originally on Cardizem 180 mg, and stated this dose for 2 weeks. This was increased to 240 mg this past Friday, May 16. He's also on hydrochlorothiazide once a day, and labetalol, 200 mg twice daily. Of course we do have to be very careful with his anti-hypertensive medications due to his history of hyperkalemia.  This has all been reviewed with Dr. Darnelle Catalan. He has suggested discontinuing the hydrochlorothiazide and beginning Lasix at 20 mg in the morning. Hopefully this will help with the edema, hypertension, as well as the hyperkalemia.  We're also going to obtain an MRI of the lumbosacral spine for further evaluation of the back pain.  I will mention that Timothy [name] is being scheduled for IVIG later this week, and he is also scheduled to see me again on Wednesday, 05/11/2013, for followup. We will reassess all these issues at that time. Per his request, we will also obtain a lipid panel on 05/11/2013 since Timothy [name] has not been seeing his primary care physician since the transplant.  Timothy Scale, PA-C 05/09/2013

## 2013-05-09 NOTE — Telephone Encounter (Signed)
Pt instructed to stop HCTZ and go to [facility] outpt to pick up rx for lasix and start tomorrow. He has appt for MRI tonight at [facility]

## 2013-05-09 NOTE — Patient Instructions (Addendum)
Hypomagnesemia Magnesium is a common ion (mineral) in the body which is needed for metabolism. It is about how the body handles food and other chemical reactions necessary for life. Only about 2% of the magnesium in our body is found in the blood. When this is low, it is called hypomagnesemia. The blood will measure only a tiny amount of the magnesium in our body. When it is low in our blood, it does not mean that the whole body supply is low. The normal serum concentration ranges from 1.8-2.5 mEq/L. When the level gets to be less than 1.0 mEq/L, a number of problems begin to happen.  CAUSES   Receiving intravenous fluids without magnesium replacement.  Loss of magnesium from the bowel by naso-gastric suction.  Loss of magnesium from nausea and vomiting or severe diarrhea. Any of the inflammatory bowel conditions can cause this.  Abuse of alcohol often leads to low serum magnesium.  An inherited form of magnesium loss happens when the kidneys lose magnesium. This is called familial or primary hypomagnesemia.  Some medications such as diuretics also cause the loss of magnesium. SYMPTOMS  These following problems are worse if the changes in magnesium levels come on suddenly.  Tremor.  Confusion.  Muscle weakness.  Over-sensitive to sights and sounds.  Sensitive reflexes.  Depression.  Muscular fibrillations.  Over-reactivity of the nerves.  Irritability.  Psychosis.  Spasms of the hand muscles.  Tetany (where the muscles go into uncontrollable spasms). DIAGNOSIS  This condition can be diagnosed by blood tests. TREATMENT   In emergency, magnesium can be given intravenously (by vein).  If the condition is less worrisome, it can be corrected by diet. High levels of magnesium are found in green leafy vegetables, peas, beans and nuts among other things. It can also be given through medications by mouth.  If it is being caused by medications, changes can be made.  If  alcohol is a problem, help is available if there are difficulties giving it up. Document Released: 09/03/2005 Document Revised: 03/01/2012 Document Reviewed: 07/28/2008 Exit[employer] Patient Information 2013 [employer].  

## 2013-05-10 ENCOUNTER — Telehealth: Payer: Self-pay | Admitting: *Deleted

## 2013-05-10 ENCOUNTER — Telehealth: Payer: Self-pay | Admitting: Oncology

## 2013-05-10 NOTE — Telephone Encounter (Signed)
, °

## 2013-05-10 NOTE — Telephone Encounter (Signed)
Per staff message and POF I have scheduled appts.  JMW  

## 2013-05-11 ENCOUNTER — Encounter: Payer: Self-pay | Admitting: Physician Assistant

## 2013-05-11 ENCOUNTER — Ambulatory Visit: Payer: Self-pay

## 2013-05-11 ENCOUNTER — Ambulatory Visit (HOSPITAL_BASED_OUTPATIENT_CLINIC_OR_DEPARTMENT_OTHER): Payer: BC Managed Care – PPO | Admitting: Physician Assistant

## 2013-05-11 ENCOUNTER — Other Ambulatory Visit: Payer: Self-pay | Admitting: Oncology

## 2013-05-11 ENCOUNTER — Other Ambulatory Visit (HOSPITAL_BASED_OUTPATIENT_CLINIC_OR_DEPARTMENT_OTHER): Payer: BC Managed Care – PPO

## 2013-05-11 ENCOUNTER — Telehealth: Payer: Self-pay | Admitting: *Deleted

## 2013-05-11 ENCOUNTER — Ambulatory Visit (HOSPITAL_BASED_OUTPATIENT_CLINIC_OR_DEPARTMENT_OTHER): Payer: BC Managed Care – PPO

## 2013-05-11 VITALS — BP 146/77 | HR 80 | Temp 98.0°F | Resp 20 | Ht 67.0 in | Wt 182.3 lb

## 2013-05-11 DIAGNOSIS — Z9489 Other transplanted organ and tissue status: Secondary | ICD-10-CM

## 2013-05-11 DIAGNOSIS — D89811 Chronic graft-versus-host disease: Secondary | ICD-10-CM

## 2013-05-11 DIAGNOSIS — C9110 Chronic lymphocytic leukemia of B-cell type not having achieved remission: Secondary | ICD-10-CM

## 2013-05-11 DIAGNOSIS — I10 Essential (primary) hypertension: Secondary | ICD-10-CM

## 2013-05-11 DIAGNOSIS — R768 Other specified abnormal immunological findings in serum: Secondary | ICD-10-CM

## 2013-05-11 DIAGNOSIS — M549 Dorsalgia, unspecified: Secondary | ICD-10-CM

## 2013-05-11 DIAGNOSIS — R197 Diarrhea, unspecified: Secondary | ICD-10-CM

## 2013-05-11 DIAGNOSIS — E860 Dehydration: Secondary | ICD-10-CM

## 2013-05-11 DIAGNOSIS — E139 Other specified diabetes mellitus without complications: Secondary | ICD-10-CM

## 2013-05-11 DIAGNOSIS — E1110 Type 2 diabetes mellitus with ketoacidosis without coma: Secondary | ICD-10-CM

## 2013-05-11 DIAGNOSIS — E785 Hyperlipidemia, unspecified: Secondary | ICD-10-CM

## 2013-05-11 DIAGNOSIS — R894 Abnormal immunological findings in specimens from other organs, systems and tissues: Secondary | ICD-10-CM

## 2013-05-11 DIAGNOSIS — G8929 Other chronic pain: Secondary | ICD-10-CM

## 2013-05-11 DIAGNOSIS — E099 Drug or chemical induced diabetes mellitus without complications: Secondary | ICD-10-CM

## 2013-05-11 DIAGNOSIS — D809 Immunodeficiency with predominantly antibody defects, unspecified: Secondary | ICD-10-CM

## 2013-05-11 DIAGNOSIS — D801 Nonfamilial hypogammaglobulinemia: Secondary | ICD-10-CM

## 2013-05-11 DIAGNOSIS — IMO0002 Reserved for concepts with insufficient information to code with codable children: Secondary | ICD-10-CM

## 2013-05-11 DIAGNOSIS — D61818 Other pancytopenia: Secondary | ICD-10-CM

## 2013-05-11 DIAGNOSIS — M818 Other osteoporosis without current pathological fracture: Secondary | ICD-10-CM

## 2013-05-11 LAB — LIPID PANEL
Cholesterol: 230 mg/dL — ABNORMAL HIGH (ref 0–200)
Total CHOL/HDL Ratio: 6.4 Ratio
Triglycerides: 556 mg/dL — ABNORMAL HIGH (ref <150)

## 2013-05-11 LAB — COMPREHENSIVE METABOLIC PANEL (CC13)
AST: 30 U/L (ref 5–34)
Albumin: 3 g/dL — ABNORMAL LOW (ref 3.5–5.0)
BUN: 45.3 mg/dL — ABNORMAL HIGH (ref 7.0–26.0)
Calcium: 9 mg/dL (ref 8.4–10.4)
Chloride: 100 mEq/L (ref 98–107)
Creatinine: 1.2 mg/dL (ref 0.7–1.3)
Glucose: 147 mg/dl — ABNORMAL HIGH (ref 70–99)

## 2013-05-11 LAB — CBC WITH DIFFERENTIAL/PLATELET
BASO%: 0 % (ref 0.0–2.0)
Eosinophils Absolute: 0 10*3/uL (ref 0.0–0.5)
MCHC: 32.6 g/dL (ref 32.0–36.0)
MONO#: 0.5 10*3/uL (ref 0.1–0.9)
MONO%: 7 % (ref 0.0–14.0)
NEUT#: 4.9 10*3/uL (ref 1.5–6.5)
RBC: 3.35 10*6/uL — ABNORMAL LOW (ref 4.20–5.82)
RDW: 16.3 % — ABNORMAL HIGH (ref 11.0–14.6)
WBC: 7 10*3/uL (ref 4.0–10.3)

## 2013-05-11 LAB — TECHNOLOGIST REVIEW

## 2013-05-11 MED ORDER — SODIUM CHLORIDE 0.9 % IJ SOLN
10.0000 mL | INTRAMUSCULAR | Status: DC | PRN
  Filled 2013-05-11: qty 10

## 2013-05-11 MED ORDER — INSULIN ASPART PROT & ASPART (70-30 MIX) 100 UNIT/ML PEN: 18.0000 [IU] | PEN_INJECTOR | Freq: Once | SUBCUTANEOUS | Status: DC

## 2013-05-11 MED ORDER — SODIUM CHLORIDE 0.9 % IJ SOLN
10.0000 mL | INTRAMUSCULAR | Status: DC | PRN
  Administered 2013-05-11 (×2): 10 mL via INTRAVENOUS
  Filled 2013-05-11: qty 10

## 2013-05-11 MED ORDER — SODIUM CHLORIDE 0.9 % IV SOLN
Freq: Once | INTRAVENOUS | Status: AC
  Administered 2013-05-11: 13:00:00 via INTRAVENOUS
  Filled 2013-05-11: qty 500

## 2013-05-11 MED ORDER — SULFAMETHOXAZOLE-TMP DS 800-160 MG PO TABS: 1.0000 | ORAL_TABLET | Freq: Every day | ORAL | Status: DC

## 2013-05-11 MED ORDER — IMMUNE GLOBULIN (HUMAN) 20 GM/200ML IV SOLN
1.0000 g/kg | Freq: Once | INTRAVENOUS | Status: DC
  Filled 2013-05-11: qty 800

## 2013-05-11 MED ORDER — HEPARIN SOD (PORK) LOCK FLUSH 100 UNIT/ML IV SOLN
500.0000 [IU] | Freq: Once | INTRAVENOUS | Status: AC
  Administered 2013-05-11: 250 [IU] via INTRAVENOUS
  Filled 2013-05-11: qty 5

## 2013-05-11 MED ORDER — INSULIN ASPART PROT & ASPART (70-30 MIX) 100 UNIT/ML ~~LOC~~ SUSP
18.0000 [IU] | Freq: Once | SUBCUTANEOUS | Status: AC
  Administered 2013-05-11: 18 [IU] via SUBCUTANEOUS
  Filled 2013-05-11: qty 0.18

## 2013-05-11 NOTE — Progress Notes (Signed)
ID: Timothy [name]   DOB: 09-07-1946  MR#: [id number]  WUJ#:[id number]  NFA:OZHY,Q DOUGLAS, MD SU: OTHER MD: Donzetta Starch, Romero Belling  HISTORY OF PRESENT ILLNESS: We have very complete records from Dr. Sydnee Levans in [place], and in summary:  The patient was initially diagnosed in August 2000, with a white cell count of 23,600, but normal hemoglobin and platelets, and no significant symptomatology. Over the next several years his white cell count drifted up, and he eventually developed some symptoms of night sweats in particular, leading to treatment with fludarabine, Cytoxan and rituxan for five cycles given between December 2006 and May 2007.  We have CT scans from June 2006, November 2006 and April 2007, and comparing the November 2006 and April 2007 scans, there was near complete response. He had subsequent therapy in [place] as detailed below, but with decreased response, leading to allogeneic stem-cell transplant at the [facility] 02/24/2012.  INTERVAL HISTORY: Timothy [name] returns with his wife Timothy [name]  for followup of his CLL, status post allogeneic stem cell transplant in [place] in March 2013.   Timothy [name] continues on oral vancomycin in addition to [place], and his diarrhea continues to improve. In fact she was excited to tell me that he did not have a bowel movement at all yesterday! The stools continue to be more formed and less frequent.  He denies any nausea or emesis, abdominal pain, fevers or chills and has noted no blood or mucous in the stools.  Timothy [name] continues to follow his blood sugars closely, and most of the time they are below 200. As instructed on Monday, he discontinued hydrochlorothiazide and started Lasix at 20 mg daily. He also continues on labetalol and Cardizem daily, and his blood pressures have also been much better.   Please biggest concern continues to be back pain which has worsened over the last week or so. An MRI of the lumbosacral spine did in fact  confirm multiple compression fractures in the spine. He is utilizing his pain medication appropriately and affectively.  REVIEW OF SYSTEMS: Rakan's energy level is fair. He is noted no rashes, but does have a reddened area on the top of his right foot he wanted me to look at today. She continues to have quite a bit of swelling in the lower extremities, and this is equal bilaterally. He is eating and drinking well. He has noted no mouth ulcers or oral sensitivity. He denies any new cough. He has shortness of breath with exertion, but this is stable. He's had no chest pain or palpitations. He denies any abnormal headaches or dizziness. He currently denies any unusual myalgias, arthralgias, or bony pain.  A detailed review of systems is otherwise stable and noncontributory.  PAST MEDICAL HISTORY: Past Medical History  Diagnosis Date  . 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 Diverticular disease   . Hyperlipidemia   . Obesity   . Hypertension   . Hiatal hernia   . CMV (cytomegalovirus) antibody positive     pre-transplant, with seroconversion x2 pst-transplant  . Right bundle branch block     pre-transplant  . CKD (chronic kidney disease) stage 2, GFR 60-89 ml/min   . Pancytopenia   . Steroid-induced diabetes   . Atrial fibrillation     post-transplant  . Myopathy   . 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 Leukemia, chronic lymphoid   . 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 Chronic GVHD complicating bone marrow transplantation  12/05/2012  . Diarrhea in adult patient 12/05/2012    Due to active GVHD  . CLL (chronic lymphocytic leukemia) 12/05/2012    Dx 07/1999; started Rx 12/06  AlloBMT 3/13  . Rash of face 12/05/2012    Due to GVHD  . Hypomagnesemia 01/26/2013    PAST SURGICAL HISTORY: Past Surgical History  Procedure Laterality Date  . Tonsillectomy and adenoidectomy    . Bone marrow transplant    . Flexible sigmoidoscopy   11/17/2012    Procedure: FLEXIBLE SIGMOIDOSCOPY;  Surgeon: Petra Kuba, MD;  Location: [facility];  Service: Endoscopy;  Laterality: N/A;  Dr Ewing Schlein states will be admitted to rooom 1339 11/16/12  . Esophagogastroduodenoscopy  11/17/2012    Procedure: ESOPHAGOGASTRODUODENOSCOPY (EGD);  Surgeon: Petra Kuba, MD;  Location: [facility];  Service: Endoscopy;  Laterality: N/A;    FAMILY HISTORY Family History  Problem Relation Age of Onset  . Cancer Father    The patient's father died from complications of chronic lymphocytic leukemia at the age of 29.  It had been diagnosed seven years before when he was 31.  The patient is enrolled in a familial chronic lymphocytic leukemia study out of the [employer] [facility].  The patient's mother is 38, alive, unfortunately suffering with dementia, and he has a brother, 49, who is otherwise in fair health.   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  His wife of >40 years, Timothy [name], is a homemaker.  Their daughter, [name], lives in [place].  She also is a Futures trader.  The patient has an 37 year old grandson and an 92-year-old granddaughter, and that is really the main reason he moved to this area.  He is a International aid/development worker.     ADVANCED DIRECTIVES: In place  HEALTH MAINTENANCE: History  Substance Use Topics  . Smoking status: Never Smoker   . Smokeless tobacco: Never Used  . Alcohol Use: No     Colonoscopy:  PSA:  Bone density:  Lipid panel:  Allergies  Allergen Reactions  . Benadryl (Diphenhydramine Hcl)     "Restless leg syndrome"    Current Outpatient Prescriptions  Medication Sig Dispense Refill  . acyclovir (ZOVIRAX) 400 MG tablet Take 2 tablets (800 mg total) by mouth 2 (two) times daily.  60 tablet  12  . antiseptic oral rinse (BIOTENE) LIQD 15 mLs by Mouth Rinse route 2 times daily at 12 noon and 4 pm.  237 mL   6  . B-D UF III MINI PEN NEEDLES 31G X 5 MM MISC       . Blood Glucose Monitoring Suppl (ONE TOUCH ULTRA 2) W/DEVICE KIT       . budesonide (ENTOCORT EC) 3 MG 24 hr capsule Take 9 mg by mouth 3 (three) times daily.      . Calcium Citrate (CITRACAL PO) Take 1,200 mg by mouth 2 (two) times daily.      . cholecalciferol (VITAMIN D) 1000 UNITS tablet Take 1,000 Units by mouth every evening.       . cholestyramine (QUESTRAN) 4 G packet Take 1 packet by mouth 2 (two) times daily with a meal.  60 each  1  . diltiazem (CARDIZEM CD) 240 MG 24 hr capsule Take 1 capsule (240 mg total) by mouth daily.  30 capsule  3  . fluconazole (DIFLUCAN) 100 MG tablet Take 1 tablet (100 mg total) by mouth every morning.  30 tablet  12  . furosemide (LASIX) 20 MG tablet Take 1 tablet (20 mg total) by mouth every morning.  30 tablet  2  . glucose blood (ONE TOUCH ULTRA TEST) test strip As directed  150 each  6  . HYDROcodone-acetaminophen (NORCO) 10-325 MG per tablet Take 1 tablet by mouth daily as needed for pain (may take 1/2 to 1 tablet in the am as needed for pain).  30 tablet  0  . insulin aspart protamine- aspart (NOVOLOG 70/30) (70-30) 100 UNIT/ML injection Inject 0.18 mLs (18 Units total) into the skin daily with breakfast.  15 mL  0  . insulin aspart protamine- aspart (NOVOLOG 70/30) (70-30) 100 UNIT/ML injection Inject 0.09 mLs (9 Units total) into the skin daily with supper.  15 mL  0  . labetalol (NORMODYNE) 200 MG tablet Take 1 tablet (200 mg total) by mouth 2 (two) times daily.  60 tablet  12  . Lidocaine-Hydrocortisone Ace 3-0.5 % KIT       . Multiple Vitamins-Minerals (CENTRUM SILVER ADULT 50+) TABS Take 1 tablet by mouth every evening.       . mupirocin ointment (BACTROBAN) 2 %       . nateglinide (STARLIX) 120 MG tablet Take 1 tablet (120 mg total) by mouth 3 (three) times daily before meals.  90 tablet  11  . Nutritional Supplements (FEEDING SUPPLEMENT, VITAL 1.5 CAL,) LIQD Take 237 mLs by mouth 2 (two)  times daily.  24 Can  12  . omeprazole (PRILOSEC) 20 MG capsule Take 1 capsule (20 mg total) by mouth daily.  90 capsule  4  . ONETOUCH DELICA LANCETS 33G MISC 33 g by Does not apply route as directed.  200 each  6  . predniSONE (DELTASONE) 20 MG tablet Take 2 tablets (40 mg total) by mouth daily.  60 tablet  2  . sertraline (ZOLOFT) 50 MG tablet Takes 50 mg one day and 100 mg the next day  90 tablet  3  . Sodium Chloride Flush (NORMAL SALINE FLUSH) 0.9 % SOLN       . sulfamethoxazole-trimethoprim (BACTRIM DS) 800-160 MG per tablet Take 1 tablet by mouth daily.  30 tablet  11  . tacrolimus (PROGRAF) 0.5 MG capsule Take 1.5 mg by mouth 2 (two) times daily.      Marland Kitchen testosterone (ANDRODERM) 4 MG/24HR PT24 patch Place 1 patch onto the skin daily.  30 patch  3  . vancomycin (VANCOCIN) 125 MG capsule Take 1 capsule (125 mg total) by mouth 4 (four) times daily.  52 capsule  3  . zinc gluconate 50 MG tablet Take 50 mg by mouth daily.       No current facility-administered medications for this visit.   Facility-Administered Medications Ordered in Other Visits  Medication Dose Route Frequency Provider Last Rate Last Dose  . 0.9 %  sodium chloride infusion   Intravenous Continuous [place] Every G Cimberly Stoffel, PA-C 500 mL/hr at 03/12/13 0900    . Immune Globulin  10% (PRIVIGEN) SOLN 80 g  1 g/kg Intravenous Once Lakevia Perris G Zakira Ressel, PA-C      . sodium chloride 0.9 % injection 10 mL  10 mL Intravenous PRN Lowella Dell, MD   10 mL at 08/11/12 1606  . sodium chloride 0.9 % injection 10 mL  10 mL Intravenous PRN Lowella Dell, MD      . sodium chloride 0.9 % injection 10 mL  10 mL Intravenous PRN Lowella Dell, MD  10 mL at 05/11/13 1454    OBJECTIVE: Middle-aged white male in no acute distress. Filed Vitals:   05/11/13 1111  BP: 146/77  Pulse: 80  Temp: 98 F (36.7 C)  Resp: 20     Body mass index is 28.55 kg/(m^2).   Filed Weights   05/11/13 1111  Weight: 182 lb 4.8 oz (82.691 kg)   ECOG FS: 2  Face  shows no rash Sclerae unicteric Left conjunctiva is less erythematous, consistent with resolving conjunctival hemmorhage Lungs no rales or rhonchi, no wheezes Heart regular rate and rhythm Abdomen  soft, nontender, positive bowel sounds Bilateral lower extremity edema, 2+ and equal bilaterally  There is a small erythematous area, less than 1.5 cm, on the top of the right foot and extending into the second toe. There is no skin breakdown. There's no tenderness or pain to touch, and no evidence of infection. Neuro: nonfocal, well oriented, positive affect Multiple ecchymoses noted on the hands and upper extremities    LAB RESULTS:  CBC    Component Value Date/Time   WBC 7.0 05/11/2013 1046   WBC 5.4 03/18/2013 0615   RBC 3.35* 05/11/2013 1046   RBC 3.17* 03/18/2013 0615   HGB 10.5* 05/11/2013 1046   HGB 9.9* 03/18/2013 0615   HCT 32.2* 05/11/2013 1046   HCT 28.8* 03/18/2013 0615   PLT 100* 05/11/2013 1046   PLT 92* 03/18/2013 0615   MCV 96.1 05/11/2013 1046   MCV 90.9 03/18/2013 0615   MCH 31.3 05/11/2013 1046   MCH 31.2 03/18/2013 0615   MCHC 32.6 05/11/2013 1046   MCHC 34.4 03/18/2013 0615   RDW 16.3* 05/11/2013 1046   RDW 14.7 03/18/2013 0615   LYMPHSABS 1.6 05/11/2013 1046   LYMPHSABS 1.4 03/18/2013 0615   MONOABS 0.5 05/11/2013 1046   MONOABS 0.3 03/18/2013 0615   EOSABS 0.0 05/11/2013 1046   EOSABS 0.0 03/18/2013 0615   BASOSABS 0.0 05/11/2013 1046   BASOSABS 0.0 03/18/2013 0615        Chemistry      Component Value Date/Time   NA 133* 05/06/2013 1219   NA 134* 04/18/2013 1218   K 4.8 05/06/2013 1219   K 4.9 04/18/2013 1218   CL 104 05/06/2013 1219   CL 106 04/18/2013 1218   CO2 23 05/06/2013 1219   CO2 21 04/18/2013 1218   BUN 43.6* 05/06/2013 1219   BUN 44* 04/18/2013 1218   CREATININE 0.9 05/06/2013 1219   CREATININE 0.95 04/18/2013 1218      Component Value Date/Time   CALCIUM 8.4 05/06/2013 1219   CALCIUM 9.0 04/18/2013 1218   ALKPHOS 74 05/06/2013 1219   ALKPHOS 75 04/18/2013 1218    AST 27 05/06/2013 1219   AST 24 04/18/2013 1218   ALT 30 05/06/2013 1219   ALT 33 04/18/2013 1218   BILITOT 0.36 05/06/2013 1219   BILITOT 0.2* 04/18/2013 1218     Magnesium  2.2  05/06/2013    1.9  04/20/2013    1.8  04/18/2013    2.2  04/13/2013  IgG   286  05/06/2013     99  04/18/2013    308  03/14/2013    124  02/24/2013   STUDIES:  Mr Lumbar Spine W Wo Contrast  05/10/2013   *RADIOLOGY REPORT*  Clinical Data: Low back pain.  Leukemia with bone marrow transplant  MRI LUMBAR SPINE WITHOUT AND WITH CONTRAST  Technique:  Multiplanar and multiecho pulse sequences of the lumbar spine were obtained without and  with intravenous contrast.  Contrast: 15mL MULTIHANCE GADOBENATE DIMEGLUMINE 529 MG/ML IV SOLN  Comparison: Lumbar radiographs 12/14/2012  Findings: Moderate compression fracture of T12 which was present previously.  However there is mild edema and enhancement in the superior plate suggesting a superimposed mild acute component.  Moderate compression fracture of L3.  This was present previously but has progressed.  There is mild bone marrow edema and enhancement involving the superior endplate indicating a superimposed acute fracture.  Mild compression fracture of L4 with bone marrow edema and enhancement in the superior endplate indicating a recent fracture.  Mild to moderate fracture of the superior plate of L5 with mild enhancement of the superior plate indicating healing fracture.  These fractures appear to be benign without associated mass or bone marrow infiltration.  Conus medullaris is normal and terminates at L1-2.  T12-L1:  Disc degeneration with central disc and osteophyte causing mild spinal stenosis  L1-2:  Mild disc and mild facet degeneration  L2-3:  Disc bulging and moderate facet hypertrophy causing mild to moderate spinal stenosis  L3-4:  Mild disc bulging.  Moderate facet hypertrophy with mild spinal stenosis  L4-5:  Moderate spinal stenosis secondary to spondylosis and  facet hypertrophy.  There is foraminal narrowing bilaterally  L5 S1:  Mild spondylosis and facet degeneration.  IMPRESSION: Compression fractures involving T12, L2, L3, L4, and L5.  These show mild superior endplate edema enhancement suggesting acute to subacute fractures with healing.  Multilevel degenerative change throughout the lumbar spine.  The spinal stenosis is most severe at L4-5 where there is moderate stenosis of the canal.   Original Report Authenticated By: Janeece Riggers, M.D.      ASSESSMENT: 68 y.o. [place] man with a history of well-differentiated lymphocytic lymphoma/ chronic lymphoid leukemia initially diagnosed in 2000, not requiring intervention until 2006; with multiple chromosomal abnormalities.  His treatment history is as follows:  (1) fludarabine/cyclophosphamide/rituximab x5 completed May 2007.   (2) rituximab for 8 doses October 2010, with partial response   (3) Leustatin and ofatumumab weekly x8 July to September 2011 followed by maintenance ofatumumab maintenance ofatumumab every 2 months, with initial response but rising counts September 2012   (4) status-post unrelated donor stem-cell transplant 02/24/2012 [facility]  (a) conditioning regimen consisted of fludarabine + TBI at 200 cGy, followed by rituximab x27;  (b) CMV reactivation x3 (patient CMV positive, donor negative), s/p ganciclovir treatment; 3d reactivation August 2013, s/p gancyclovir, with negative PCR mid-September 2013; last gancyclovir dose 10/06/2012 (c) Chronic GVHD: involving gut and skin, treated with steroids, tacrolimus and MMF.  MMF was eventually d/c'd and tacrolimus currently at a dose of 1.5mg  BID (d) atrial fibrillation: resolved on brief amiodarone regimen (e) steroid-induced myopathy: improving  (f) hypomagnesemia: improved after d/c gancyclovir (g) hypogammaglobulinemia: s/p IVIG most recently 01/07/2013. (h) history of elevated triglycerides (606 on 07/14/2012)  (i) adrenal  insufficiency: on prednisone and budesonide (j) pancytopenia, improved  (5) restaging studies September 2013  including CT scans, flow cytometry, and bone marrow biopsy, showed no evidence of residual chronic lymphoid leukemia.  (6) recurrent GVHD (skin rash, mouth changes, severe diarrhea and gastric/duodenal/colonic biopsies 11/17/2012 c/w GVHD grade 2) now only remaining sign is bothersome diarrhea; c diff negative x3; continuing current regimen   (7) severe malnutrition -- on VITAL supplement in addition to regular diet; on Marinol for anorexia  (8) testosterone deficiency--on patch   (9) deconditioning: ongoing REHAB   (10) mild dehydration: encouraged increased po fluids; receives IVF support w magnesium three  times weekly  (11) steroid-induced osteoporosis with compression fractures: received pamidronate 12/18/2012   (12) nausea: improved on current meds  (13)  Positive c.diff, 03/08/2013, on Flagyl 500 mg TID x 20 days, then on oral vanco with Questran, showing improvement  (14) hyperkalemia, improving  (15)  Hypertension, on labetalol, cardizem, and furosemide  (16) steroid induced hyperglycemia, on sterlix and 70/30 insulin  PLAN: Dannon will receive IV fluids, magnesium, and IVIG today as scheduled. I have reviewed his case with Dr. Darnelle Catalan, and we both agree that it would be reasonable to cut back to twice weekly with IV fluids and magnesium beginning next week. Accordingly, he will receive fluids on Friday, May 23rd. He will then return next week on May 28th and May 30. I will see him in 2 weeks for followup on June 5.  Garv will continue on  all of his current oral medications. I am refilling his Bactrim DS, and he'll continue to utilize both Questran and oral  vanc  As before for history of C. difficile. With regards to his hypertension, he will continue on Cardizem 240 mg daily, labetalol twice daily, and Lasix at 20 mg daily. His current dose of prednisone is down to  40 mg which he will continue for the next 2 weeks.   With regard to his back pain, we are referring him to Dr. Kerby Nora for consideration of kyphoplasty. Dr. Darnelle Catalan have suggested beginning zoledronic acid, the plan being to treat every 6 months. Alontae does need to see a dentist first, and will having get that scheduled right away.  He'll continue to follow with Dr. Everardo All with regards to his diabetes. We are little concerned about the rare area on the top of his right foot which she'll continue to follow very closely. Dr. Darnelle Catalan has also suggested that Timothy [name] utilize compression stockings to decrease the edema in the lower extremities.   Timothy [name] voices understanding and agreement with this plan, and  he and Timothy [name] both know to call if he has any changes or problems.   Zollie Scale    05/11/2013

## 2013-05-11 NOTE — Patient Instructions (Addendum)
Patient to call MD for problems or concerns 

## 2013-05-11 NOTE — Telephone Encounter (Signed)
appts made and printed . Pt is aware that fliuds and tx will be added later by MW. i emailed MW to add fluids and tx. i also lm @ dr. Corliss Skains office with my name and phone number along with the pty information, and now waiting on someone to return my call....td

## 2013-05-11 NOTE — Progress Notes (Signed)
Novolog 18 units checked with Faith Rogue, RN.

## 2013-05-12 ENCOUNTER — Telehealth: Payer: Self-pay | Admitting: *Deleted

## 2013-05-12 ENCOUNTER — Other Ambulatory Visit: Payer: Self-pay | Admitting: Oncology

## 2013-05-12 ENCOUNTER — Other Ambulatory Visit: Payer: Self-pay | Admitting: *Deleted

## 2013-05-12 ENCOUNTER — Telehealth: Payer: Self-pay | Admitting: Oncology

## 2013-05-12 ENCOUNTER — Ambulatory Visit (HOSPITAL_BASED_OUTPATIENT_CLINIC_OR_DEPARTMENT_OTHER): Payer: BC Managed Care – PPO

## 2013-05-12 DIAGNOSIS — D89811 Chronic graft-versus-host disease: Secondary | ICD-10-CM

## 2013-05-12 DIAGNOSIS — D801 Nonfamilial hypogammaglobulinemia: Secondary | ICD-10-CM

## 2013-05-12 MED ORDER — SODIUM CHLORIDE 0.9 % IJ SOLN
10.0000 mL | INTRAMUSCULAR | Status: DC | PRN
  Administered 2013-05-12: 10 mL via INTRAVENOUS
  Filled 2013-05-12: qty 10

## 2013-05-12 MED ORDER — HEPARIN SOD (PORK) LOCK FLUSH 100 UNIT/ML IV SOLN
500.0000 [IU] | Freq: Once | INTRAVENOUS | Status: AC
  Administered 2013-05-12: 500 [IU] via INTRAVENOUS
  Filled 2013-05-12: qty 5

## 2013-05-12 MED ORDER — HYDROCODONE-ACETAMINOPHEN 10-325 MG PO TABS: ORAL_TABLET | ORAL | Status: DC

## 2013-05-12 MED ORDER — IMMUNE GLOBULIN (HUMAN) 20 GM/200ML IV SOLN
1.0000 g/kg | Freq: Once | INTRAVENOUS | Status: AC
  Administered 2013-05-12: 80 g via INTRAVENOUS
  Filled 2013-05-12: qty 800

## 2013-05-12 MED ORDER — IMMUNE GLOBULIN (HUMAN) 5 GM/100ML IV SOLN: 400.0000 mg/kg | Freq: Once | INTRAVENOUS | Status: DC

## 2013-05-12 NOTE — Patient Instructions (Addendum)

## 2013-05-12 NOTE — Telephone Encounter (Signed)
Lm informing the pt that 5/28 appt time had changed. gv the correct appt time for 5/28 @2pm ...td

## 2013-05-12 NOTE — Telephone Encounter (Signed)
Per staff message and POF I have scheduled appts. Appts for 5/28 and 6/18 not scheduled, need new dates/times.   JMW

## 2013-05-12 NOTE — Telephone Encounter (Signed)
Pt wife came by to pickup a printout she requested that the pt have labs completed on 6/5 and 6/18 as well. appts was made and printed...td

## 2013-05-12 NOTE — Telephone Encounter (Signed)
Faxed pt medical records to Dr. Deveshwar °

## 2013-05-13 ENCOUNTER — Ambulatory Visit (HOSPITAL_BASED_OUTPATIENT_CLINIC_OR_DEPARTMENT_OTHER): Payer: BC Managed Care – PPO

## 2013-05-13 MED ORDER — HEPARIN SOD (PORK) LOCK FLUSH 100 UNIT/ML IV SOLN
250.0000 [IU] | Freq: Once | INTRAVENOUS | Status: AC
  Administered 2013-05-13: 250 [IU] via INTRAVENOUS
  Filled 2013-05-13: qty 5

## 2013-05-13 MED ORDER — SODIUM CHLORIDE 0.9 % IJ SOLN
10.0000 mL | Freq: Once | INTRAMUSCULAR | Status: AC
  Administered 2013-05-13: 10 mL
  Filled 2013-05-13: qty 10

## 2013-05-13 MED ORDER — SODIUM CHLORIDE 0.9 % IV SOLN
Freq: Once | INTRAVENOUS | Status: AC
  Administered 2013-05-13: 11:00:00 via INTRAVENOUS
  Filled 2013-05-13: qty 500

## 2013-05-13 MED ORDER — SODIUM CHLORIDE 0.9 % IV SOLN
Freq: Once | INTRAVENOUS | Status: AC
  Administered 2013-05-13: 10:00:00 via INTRAVENOUS

## 2013-05-13 NOTE — Patient Instructions (Addendum)
Dehydration, Adult Dehydration is when you lose more fluids from the body than you take in. Vital organs like the kidneys, brain, and heart cannot function without a proper amount of fluids and salt. Any loss of fluids from the body can cause dehydration.  CAUSES   Vomiting.  Diarrhea.  Excessive sweating.  Excessive urine output.  Fever. SYMPTOMS  Mild dehydration  Thirst.  Dry lips.  Slightly dry mouth. Moderate dehydration  Very dry mouth.  Sunken eyes.  Skin does not bounce back quickly when lightly pinched and released.  Dark urine and decreased urine production.  Decreased tear production.  Headache. Severe dehydration  Very dry mouth.  Extreme thirst.  Rapid, weak pulse (more than 100 beats per minute at rest).  Cold hands and feet.  Not able to sweat in spite of heat and temperature.  Rapid breathing.  Blue lips.  Confusion and lethargy.  Difficulty being awakened.  Minimal urine production.  No tears. DIAGNOSIS  Your caregiver will diagnose dehydration based on your symptoms and your exam. Blood and urine tests will help confirm the diagnosis. The diagnostic evaluation should also identify the cause of dehydration. TREATMENT  Treatment of mild or moderate dehydration can often be done at home by increasing the amount of fluids that you drink. It is best to drink small amounts of fluid more often. Drinking too much at one time can make vomiting worse. Refer to the home care instructions below. Severe dehydration needs to be treated at the hospital where you will probably be given intravenous (IV) fluids that contain water and electrolytes. HOME CARE INSTRUCTIONS   Ask your caregiver about specific rehydration instructions.  Drink enough fluids to keep your urine clear or pale yellow.  Drink small amounts frequently if you have nausea and vomiting.  Eat as you normally do.  Avoid:  Foods or drinks high in sugar.  Carbonated  drinks.  Juice.  Extremely hot or cold fluids.  Drinks with caffeine.  Fatty, greasy foods.  Alcohol.  Tobacco.  Overeating.  Gelatin desserts.  Wash your hands well to avoid spreading bacteria and viruses.  Only take over-the-counter or prescription medicines for pain, discomfort, or fever as directed by your caregiver.  Ask your caregiver if you should continue all prescribed and over-the-counter medicines.  Keep all follow-up appointments with your caregiver. SEEK MEDICAL CARE IF:  You have abdominal pain and it increases or stays in one area (localizes).  You have a rash, stiff neck, or severe headache.  You are irritable, sleepy, or difficult to awaken.  You are weak, dizzy, or extremely thirsty. SEEK IMMEDIATE MEDICAL CARE IF:   You are unable to keep fluids down or you get worse despite treatment.  You have frequent episodes of vomiting or diarrhea.  You have blood or green matter (bile) in your vomit.  You have blood in your stool or your stool looks black and tarry.  You have not urinated in 6 to 8 hours, or you have only urinated a small amount of very dark urine.  You have a fever.  You faint. MAKE SURE YOU:   Understand these instructions.  Will watch your condition.  Will get help right away if you are not doing well or get worse. Document Released: 12/08/2005 Document Revised: 03/01/2012 Document Reviewed: 07/28/2011 Exit[employer] Patient Information 2014 [employer].  

## 2013-05-18 ENCOUNTER — Other Ambulatory Visit (HOSPITAL_BASED_OUTPATIENT_CLINIC_OR_DEPARTMENT_OTHER): Payer: BC Managed Care – PPO | Admitting: Lab

## 2013-05-18 ENCOUNTER — Other Ambulatory Visit: Payer: Self-pay | Admitting: *Deleted

## 2013-05-18 ENCOUNTER — Ambulatory Visit (HOSPITAL_BASED_OUTPATIENT_CLINIC_OR_DEPARTMENT_OTHER): Payer: BC Managed Care – PPO

## 2013-05-18 ENCOUNTER — Other Ambulatory Visit: Payer: Self-pay | Admitting: Lab

## 2013-05-18 ENCOUNTER — Other Ambulatory Visit: Payer: Self-pay | Admitting: Physician Assistant

## 2013-05-18 DIAGNOSIS — C9110 Chronic lymphocytic leukemia of B-cell type not having achieved remission: Secondary | ICD-10-CM

## 2013-05-18 DIAGNOSIS — IMO0002 Reserved for concepts with insufficient information to code with codable children: Secondary | ICD-10-CM

## 2013-05-18 LAB — COMPREHENSIVE METABOLIC PANEL (CC13)
AST: 40 U/L — ABNORMAL HIGH (ref 5–34)
Alkaline Phosphatase: 105 U/L (ref 40–150)
BUN: 48.1 mg/dL — ABNORMAL HIGH (ref 7.0–26.0)
CO2: 23 mEq/L (ref 22–29)
Calcium: 8.8 mg/dL (ref 8.4–10.4)
Chloride: 100 mEq/L (ref 98–107)
Creatinine: 1.3 mg/dL (ref 0.7–1.3)
Potassium: 5.1 mEq/L (ref 3.5–5.1)
Sodium: 130 mEq/L — ABNORMAL LOW (ref 136–145)

## 2013-05-18 LAB — CBC WITH DIFFERENTIAL/PLATELET
BASO%: 0.3 % (ref 0.0–2.0)
Eosinophils Absolute: 0 10*3/uL (ref 0.0–0.5)
MCHC: 33.7 g/dL (ref 32.0–36.0)
MONO#: 0.2 10*3/uL (ref 0.1–0.9)
NEUT#: 4.5 10*3/uL (ref 1.5–6.5)
Platelets: 78 10*3/uL — ABNORMAL LOW (ref 140–400)
RBC: 3.02 10*6/uL — ABNORMAL LOW (ref 4.20–5.82)
RDW: 15.8 % — ABNORMAL HIGH (ref 11.0–14.6)
WBC: 5.7 10*3/uL (ref 4.0–10.3)
lymph#: 1 10*3/uL (ref 0.9–3.3)

## 2013-05-18 MED ORDER — SODIUM CHLORIDE 0.9 % IV SOLN
Freq: Once | INTRAVENOUS | Status: AC
  Administered 2013-05-18: 15:00:00 via INTRAVENOUS
  Filled 2013-05-18: qty 500

## 2013-05-18 MED ORDER — HEPARIN SOD (PORK) LOCK FLUSH 100 UNIT/ML IV SOLN
500.0000 [IU] | Freq: Once | INTRAVENOUS | Status: AC
  Administered 2013-05-18: 250 [IU] via INTRAVENOUS
  Filled 2013-05-18: qty 5

## 2013-05-18 MED ORDER — SODIUM CHLORIDE 0.9 % IJ SOLN
10.0000 mL | INTRAMUSCULAR | Status: DC | PRN
  Administered 2013-05-18: 10 mL via INTRAVENOUS
  Filled 2013-05-18: qty 10

## 2013-05-18 MED ORDER — OXYCODONE HCL 10 MG PO TB12: 10.0000 mg | ORAL_TABLET | Freq: Two times a day (BID) | ORAL | Status: DC

## 2013-05-18 MED ORDER — HYDROCODONE-ACETAMINOPHEN 10-325 MG PO TABS: ORAL_TABLET | ORAL | Status: DC

## 2013-05-18 NOTE — Patient Instructions (Addendum)
Hypomagnesemia Magnesium is a common ion (mineral) in the body which is needed for metabolism. It is about how the body handles food and other chemical reactions necessary for life. Only about 2% of the magnesium in our body is found in the blood. When this is low, it is called hypomagnesemia. The blood will measure only a tiny amount of the magnesium in our body. When it is low in our blood, it does not mean that the whole body supply is low. The normal serum concentration ranges from 1.8-2.5 mEq/L. When the level gets to be less than 1.0 mEq/L, a number of problems begin to happen.  CAUSES   Receiving intravenous fluids without magnesium replacement.  Loss of magnesium from the bowel by naso-gastric suction.  Loss of magnesium from nausea and vomiting or severe diarrhea. Any of the inflammatory bowel conditions can cause this.  Abuse of alcohol often leads to low serum magnesium.  An inherited form of magnesium loss happens when the kidneys lose magnesium. This is called familial or primary hypomagnesemia.  Some medications such as diuretics also cause the loss of magnesium. SYMPTOMS  These following problems are worse if the changes in magnesium levels come on suddenly.  Tremor.  Confusion.  Muscle weakness.  Over-sensitive to sights and sounds.  Sensitive reflexes.  Depression.  Muscular fibrillations.  Over-reactivity of the nerves.  Irritability.  Psychosis.  Spasms of the hand muscles.  Tetany (where the muscles go into uncontrollable spasms). DIAGNOSIS  This condition can be diagnosed by blood tests. TREATMENT   In emergency, magnesium can be given intravenously (by vein).  If the condition is less worrisome, it can be corrected by diet. High levels of magnesium are found in green leafy vegetables, peas, beans and nuts among other things. It can also be given through medications by mouth.  If it is being caused by medications, changes can be made.  If  alcohol is a problem, help is available if there are difficulties giving it up. Document Released: 09/03/2005 Document Revised: 03/01/2012 Document Reviewed: 07/28/2008 ExitCare Patient Information 2014 [employer].        

## 2013-05-20 ENCOUNTER — Other Ambulatory Visit: Payer: Self-pay | Admitting: *Deleted

## 2013-05-20 ENCOUNTER — Ambulatory Visit (HOSPITAL_BASED_OUTPATIENT_CLINIC_OR_DEPARTMENT_OTHER): Payer: BC Managed Care – PPO

## 2013-05-20 DIAGNOSIS — G8929 Other chronic pain: Secondary | ICD-10-CM

## 2013-05-20 DIAGNOSIS — C9110 Chronic lymphocytic leukemia of B-cell type not having achieved remission: Secondary | ICD-10-CM

## 2013-05-20 MED ORDER — HEPARIN SOD (PORK) LOCK FLUSH 100 UNIT/ML IV SOLN
500.0000 [IU] | Freq: Once | INTRAVENOUS | Status: AC
  Administered 2013-05-20: 500 [IU] via INTRAVENOUS
  Filled 2013-05-20: qty 5

## 2013-05-20 MED ORDER — SODIUM CHLORIDE 0.9 % IV SOLN
Freq: Once | INTRAVENOUS | Status: AC
  Administered 2013-05-20: 15:00:00 via INTRAVENOUS
  Filled 2013-05-20: qty 500

## 2013-05-20 NOTE — Patient Instructions (Addendum)
Hypomagnesemia Magnesium is a common ion (mineral) in the body which is needed for metabolism. It is about how the body handles food and other chemical reactions necessary for life. Only about 2% of the magnesium in our body is found in the blood. When this is low, it is called hypomagnesemia. The blood will measure only a tiny amount of the magnesium in our body. When it is low in our blood, it does not mean that the whole body supply is low. The normal serum concentration ranges from 1.8-2.5 mEq/L. When the level gets to be less than 1.0 mEq/L, a number of problems begin to happen.  CAUSES   Receiving intravenous fluids without magnesium replacement.  Loss of magnesium from the bowel by naso-gastric suction.  Loss of magnesium from nausea and vomiting or severe diarrhea. Any of the inflammatory bowel conditions can cause this.  Abuse of alcohol often leads to low serum magnesium.  An inherited form of magnesium loss happens when the kidneys lose magnesium. This is called familial or primary hypomagnesemia.  Some medications such as diuretics also cause the loss of magnesium. SYMPTOMS  These following problems are worse if the changes in magnesium levels come on suddenly.  Tremor.  Confusion.  Muscle weakness.  Over-sensitive to sights and sounds.  Sensitive reflexes.  Depression.  Muscular fibrillations.  Over-reactivity of the nerves.  Irritability.  Psychosis.  Spasms of the hand muscles.  Tetany (where the muscles go into uncontrollable spasms). DIAGNOSIS  This condition can be diagnosed by blood tests. TREATMENT   In emergency, magnesium can be given intravenously (by vein).  If the condition is less worrisome, it can be corrected by diet. High levels of magnesium are found in green leafy vegetables, peas, beans and nuts among other things. It can also be given through medications by mouth.  If it is being caused by medications, changes can be made.  If  alcohol is a problem, help is available if there are difficulties giving it up. Document Released: 09/03/2005 Document Revised: 03/01/2012 Document Reviewed: 07/28/2008 ExitCare Patient Information 2014 [employer].        

## 2013-05-23 ENCOUNTER — Other Ambulatory Visit: Payer: Self-pay | Admitting: *Deleted

## 2013-05-23 ENCOUNTER — Ambulatory Visit (HOSPITAL_BASED_OUTPATIENT_CLINIC_OR_DEPARTMENT_OTHER): Payer: BC Managed Care – PPO

## 2013-05-23 ENCOUNTER — Other Ambulatory Visit (HOSPITAL_BASED_OUTPATIENT_CLINIC_OR_DEPARTMENT_OTHER): Payer: BC Managed Care – PPO

## 2013-05-23 DIAGNOSIS — D89811 Chronic graft-versus-host disease: Secondary | ICD-10-CM

## 2013-05-23 DIAGNOSIS — C9110 Chronic lymphocytic leukemia of B-cell type not having achieved remission: Secondary | ICD-10-CM

## 2013-05-23 LAB — CBC WITH DIFFERENTIAL/PLATELET
Basophils Absolute: 0 10*3/uL (ref 0.0–0.1)
Eosinophils Absolute: 0 10*3/uL (ref 0.0–0.5)
HGB: 10.1 g/dL — ABNORMAL LOW (ref 13.0–17.1)
MCV: 96.8 fL (ref 79.3–98.0)
MONO%: 6.3 % (ref 0.0–14.0)
NEUT#: 4.2 10*3/uL (ref 1.5–6.5)
Platelets: 62 10*3/uL — ABNORMAL LOW (ref 140–400)
RDW: 15.4 % — ABNORMAL HIGH (ref 11.0–14.6)

## 2013-05-23 LAB — COMPREHENSIVE METABOLIC PANEL (CC13)
Albumin: 3 g/dL — ABNORMAL LOW (ref 3.5–5.0)
CO2: 23 mEq/L (ref 22–29)
Calcium: 9 mg/dL (ref 8.4–10.4)
Chloride: 101 mEq/L (ref 98–107)
Glucose: 54 mg/dl — ABNORMAL LOW (ref 70–99)
Sodium: 131 mEq/L — ABNORMAL LOW (ref 136–145)
Total Bilirubin: 0.43 mg/dL (ref 0.20–1.20)
Total Protein: 5.8 g/dL — ABNORMAL LOW (ref 6.4–8.3)

## 2013-05-23 LAB — MAGNESIUM (CC13): Magnesium: 1.9 mg/dl (ref 1.5–2.5)

## 2013-05-23 MED ORDER — HEPARIN SOD (PORK) LOCK FLUSH 100 UNIT/ML IV SOLN
500.0000 [IU] | Freq: Once | INTRAVENOUS | Status: AC
  Administered 2013-05-23: 500 [IU] via INTRAVENOUS
  Filled 2013-05-23: qty 5

## 2013-05-23 MED ORDER — SODIUM CHLORIDE 0.9 % IV SOLN
INTRAVENOUS | Status: DC
  Administered 2013-05-23: 11:00:00 via INTRAVENOUS

## 2013-05-23 MED ORDER — SODIUM CHLORIDE 0.9 % IJ SOLN
10.0000 mL | INTRAMUSCULAR | Status: DC | PRN
  Administered 2013-05-23: 10 mL via INTRAVENOUS
  Filled 2013-05-23: qty 10

## 2013-05-23 MED ORDER — SODIUM CHLORIDE 0.9 % IV SOLN
Freq: Once | INTRAVENOUS | Status: AC
  Administered 2013-05-23: 11:00:00 via INTRAVENOUS
  Filled 2013-05-23: qty 500

## 2013-05-23 NOTE — Patient Instructions (Signed)
Patient aware of next appointment; discharged home with no complaints. 

## 2013-05-25 ENCOUNTER — Other Ambulatory Visit: Payer: Self-pay | Admitting: Physician Assistant

## 2013-05-26 ENCOUNTER — Other Ambulatory Visit (HOSPITAL_BASED_OUTPATIENT_CLINIC_OR_DEPARTMENT_OTHER): Payer: BC Managed Care – PPO | Admitting: Lab

## 2013-05-26 ENCOUNTER — Encounter: Payer: Self-pay | Admitting: Physician Assistant

## 2013-05-26 ENCOUNTER — Telehealth: Payer: Self-pay | Admitting: Oncology

## 2013-05-26 ENCOUNTER — Ambulatory Visit (HOSPITAL_BASED_OUTPATIENT_CLINIC_OR_DEPARTMENT_OTHER): Payer: BC Managed Care – PPO | Admitting: Physician Assistant

## 2013-05-26 ENCOUNTER — Telehealth: Payer: Self-pay | Admitting: *Deleted

## 2013-05-26 VITALS — BP 145/80 | HR 71 | Temp 98.3°F | Resp 20 | Ht 67.0 in | Wt 182.1 lb

## 2013-05-26 DIAGNOSIS — T380X5A Adverse effect of glucocorticoids and synthetic analogues, initial encounter: Secondary | ICD-10-CM

## 2013-05-26 DIAGNOSIS — G252 Other specified forms of tremor: Secondary | ICD-10-CM

## 2013-05-26 DIAGNOSIS — R197 Diarrhea, unspecified: Secondary | ICD-10-CM

## 2013-05-26 DIAGNOSIS — I10 Essential (primary) hypertension: Secondary | ICD-10-CM

## 2013-05-26 DIAGNOSIS — C9110 Chronic lymphocytic leukemia of B-cell type not having achieved remission: Secondary | ICD-10-CM

## 2013-05-26 DIAGNOSIS — Z9489 Other transplanted organ and tissue status: Secondary | ICD-10-CM

## 2013-05-26 DIAGNOSIS — E099 Drug or chemical induced diabetes mellitus without complications: Secondary | ICD-10-CM

## 2013-05-26 DIAGNOSIS — E860 Dehydration: Secondary | ICD-10-CM

## 2013-05-26 DIAGNOSIS — D89811 Chronic graft-versus-host disease: Secondary | ICD-10-CM

## 2013-05-26 DIAGNOSIS — D801 Nonfamilial hypogammaglobulinemia: Secondary | ICD-10-CM

## 2013-05-26 LAB — COMPREHENSIVE METABOLIC PANEL (CC13)
AST: 55 U/L — ABNORMAL HIGH (ref 5–34)
Albumin: 3 g/dL — ABNORMAL LOW (ref 3.5–5.0)
BUN: 35.8 mg/dL — ABNORMAL HIGH (ref 7.0–26.0)
CO2: 26 mEq/L (ref 22–29)
Calcium: 8.9 mg/dL (ref 8.4–10.4)
Chloride: 100 mEq/L (ref 98–107)
Creatinine: 1 mg/dL (ref 0.7–1.3)
Potassium: 4.7 mEq/L (ref 3.5–5.1)

## 2013-05-26 LAB — CBC WITH DIFFERENTIAL/PLATELET
Basophils Absolute: 0 10*3/uL (ref 0.0–0.1)
EOS%: 0.2 % (ref 0.0–7.0)
Eosinophils Absolute: 0 10*3/uL (ref 0.0–0.5)
HCT: 32.2 % — ABNORMAL LOW (ref 38.4–49.9)
HGB: 10.5 g/dL — ABNORMAL LOW (ref 13.0–17.1)
MCH: 31.6 pg (ref 27.2–33.4)
MONO#: 0.3 10*3/uL (ref 0.1–0.9)
NEUT#: 4.5 10*3/uL (ref 1.5–6.5)
NEUT%: 70.9 % (ref 39.0–75.0)
RDW: 15.2 % — ABNORMAL HIGH (ref 11.0–14.6)
WBC: 6.4 10*3/uL (ref 4.0–10.3)
lymph#: 1.5 10*3/uL (ref 0.9–3.3)

## 2013-05-26 LAB — MAGNESIUM (CC13): Magnesium: 1.8 mg/dl (ref 1.5–2.5)

## 2013-05-26 NOTE — Progress Notes (Signed)
ID: Timothy [name]   DOB: [birth year]  MR#: [id number]  WUJ#:[id number]  NFA:OZHY,Q DOUGLAS, MD SU: OTHER MD: Donzetta Starch, Romero Belling  HISTORY OF PRESENT ILLNESS: We have very complete records from Dr. Sydnee Levans in [place], and in summary:  The patient was initially diagnosed in August 2000, with a white cell count of 23,600, but normal hemoglobin and platelets, and no significant symptomatology. Over the next several years his white cell count drifted up, and he eventually developed some symptoms of night sweats in particular, leading to treatment with fludarabine, Cytoxan and rituxan for five cycles given between December 2006 and May 2007.  We have CT scans from June 2006, November 2006 and April 2007, and comparing the November 2006 and April 2007 scans, there was near complete response. He had subsequent therapy in [place] as detailed below, but with decreased response, leading to allogeneic stem-cell transplant at the [facility] 02/24/2012.  INTERVAL HISTORY: Timothy [name] returns with his wife Timothy [name]  for followup of his CLL, status post allogeneic stem cell transplant in [place] in March 2013.   Timothy [name] continues on oral vancomycin in addition to Questran, and his diarrhea has resolved.  Yesterday he had 2 normal bowel movements, 1 in the morning, one in the evening, and both well formed.   He denies any nausea or emesis, abdominal pain, fevers or chills and has noted no blood or mucous in the stools.  Timothy [name] continues to follow his blood sugars closely, and they have been much better controlled. His blood pressure has remained elevated on and off at home, although was normal here in our office today. He continues on furosemide, labetalol and Cardizem daily as previously prescribed.  He continues to have back pain, and for some reason we have had a difficult time getting him in to see Dr. Titus Dubin for consideration of kyphoplasty. Hopefully this will be scheduled for him very  soon. In the meanwhile he is utilizing OxyContin, 10 mg, but not on a regular basis. Occasionally, he also takes a dose of hydrocodone/APAP, but never more than 2 tablets daily. His pain is stable and has not worsened. He denies any radiating pain, and has had no numbness in the lower extremities. He does, however, continue to have quite a bit of swelling in the lower extremities.  Tramaine met with Dr. Charlotte Sanes with regards to his vision changes, and is scheduled for cataract removal on [phone] and [phone].  REVIEW OF SYSTEMS: Timothy [name]'s energy level is improving. He is noted no rashes or skin changes. He previously had a reddened area on the top of his right foot, and this has resolved.He is eating and drinking well. His weight is stable. He has noted no mouth ulcers or oral sensitivity. He denies any new cough. He has shortness of breath with exertion, but this is stable. He's had no chest pain or palpitations. He denies any abnormal headaches or dizziness. He currently denies any unusual myalgias, arthralgias, or bony pain.  A detailed review of systems is otherwise stable and noncontributory.  PAST MEDICAL HISTORY: Past Medical History  Diagnosis Date  . 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 Diverticular disease   . Hyperlipidemia   . Obesity   . Hypertension   . Hiatal hernia   . CMV (cytomegalovirus) antibody positive     pre-transplant, with seroconversion x2 pst-transplant  . Right bundle branch block     pre-transplant  . CKD (chronic kidney disease) stage 2, GFR 60-89 ml/min   .  Pancytopenia   . Steroid-induced diabetes   . Atrial fibrillation     post-transplant  . Myopathy   . 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 Leukemia, chronic lymphoid   . 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 Diarrhea in adult patient 12/05/2012    Due to active GVHD  . CLL (chronic lymphocytic  leukemia) 12/05/2012    Dx 07/1999; started Rx 12/06  AlloBMT 3/13  . Rash of face 12/05/2012    Due to GVHD  . Hypomagnesemia 01/26/2013    PAST SURGICAL HISTORY: Past Surgical History  Procedure Laterality Date  . Tonsillectomy and adenoidectomy    . Bone marrow transplant    . Flexible sigmoidoscopy  11/17/2012    Procedure: FLEXIBLE SIGMOIDOSCOPY;  Surgeon: Petra Kuba, MD;  Location: [facility];  Service: Endoscopy;  Laterality: N/A;  Dr Ewing Schlein states will be admitted to rooom 1339 11/16/12  . Esophagogastroduodenoscopy  11/17/2012    Procedure: ESOPHAGOGASTRODUODENOSCOPY (EGD);  Surgeon: Petra Kuba, MD;  Location: [facility];  Service: Endoscopy;  Laterality: N/A;    FAMILY HISTORY Family History  Problem Relation Age of Onset  . Cancer Father    The patient's father died from complications of chronic lymphocytic leukemia at the age of 42.  It had been diagnosed seven years before when he was 54.  The patient is enrolled in a familial chronic lymphocytic leukemia study out of the [facility].  The patient's mother is 23, alive, unfortunately suffering with dementia, and he has a brother, 67, who is otherwise in fair health.   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  His wife of >40 years, Timothy [name], is a homemaker.  Their daughter, Timothy [name], lives in [place].  She also is a Futures trader.  The patient has an 76 year old grandson and an 72-year-old granddaughter, and that is really the main reason he moved to this area.  He is a International aid/development worker.     ADVANCED DIRECTIVES: In place  HEALTH MAINTENANCE: History  Substance Use Topics  . Smoking status: Never Smoker   . Smokeless tobacco: Never Used  . Alcohol Use: No     Colonoscopy:  PSA:  Bone density:  Lipid panel:  Allergies  Allergen Reactions  . Benadryl (Diphenhydramine Hcl)      "Restless leg syndrome"    Current Outpatient Prescriptions  Medication Sig Dispense Refill  . acyclovir (ZOVIRAX) 400 MG tablet Take 2 tablets (800 mg total) by mouth 2 (two) times daily.  60 tablet  12  . antiseptic oral rinse (BIOTENE) LIQD 15 mLs by Mouth Rinse route 2 times daily at 12 noon and 4 pm.  237 mL  6  . B-D UF III MINI PEN NEEDLES 31G X 5 MM MISC       . Blood Glucose Monitoring Suppl (ONE TOUCH ULTRA 2) W/DEVICE KIT       . budesonide (ENTOCORT EC) 3 MG 24 hr capsule Take 9 mg by mouth 3 (three) times daily.      . Calcium Citrate (CITRACAL PO) Take 1,200 mg by mouth 2 (two) times daily.      . cholecalciferol (VITAMIN D) 1000 UNITS tablet Take 1,000 Units by mouth every evening.       . cholestyramine (QUESTRAN) 4 G packet Take 1 packet by mouth 2 (  two) times daily with a meal.  60 each  1  . diltiazem (CARDIZEM CD) 240 MG 24 hr capsule Take 1 capsule (240 mg total) by mouth daily.  30 capsule  3  . fluconazole (DIFLUCAN) 100 MG tablet Take 1 tablet (100 mg total) by mouth every morning.  30 tablet  12  . furosemide (LASIX) 20 MG tablet Take 1 tablet (20 mg total) by mouth every morning.  30 tablet  2  . glucose blood (ONE TOUCH ULTRA TEST) test strip As directed  150 each  6  . Heparin Lock Flush (HEPARIN FLUSH, PORCINE,) 100 UNIT/ML injection       . HYDROcodone-acetaminophen (NORCO) 10-325 MG per tablet 1-2 po q 6 hours prn pain  60 tablet  1  . insulin aspart protamine- aspart (NOVOLOG 70/30) (70-30) 100 UNIT/ML injection Inject 0.18 mLs (18 Units total) into the skin daily with breakfast.  15 mL  0  . insulin aspart protamine- aspart (NOVOLOG 70/30) (70-30) 100 UNIT/ML injection Inject 0.09 mLs (9 Units total) into the skin daily with supper.  15 mL  0  . labetalol (NORMODYNE) 200 MG tablet Take 1 tablet (200 mg total) by mouth 2 (two) times daily.  60 tablet  12  . Lidocaine-Hydrocortisone Ace 3-0.5 % KIT       . Multiple Vitamins-Minerals (CENTRUM SILVER ADULT 50+)  TABS Take 1 tablet by mouth every evening.       . mupirocin ointment (BACTROBAN) 2 %       . nateglinide (STARLIX) 120 MG tablet Take 1 tablet (120 mg total) by mouth 3 (three) times daily before meals.  90 tablet  11  . Nutritional Supplements (FEEDING SUPPLEMENT, VITAL 1.5 CAL,) LIQD Take 237 mLs by mouth 2 (two) times daily.  24 Can  12  . omeprazole (PRILOSEC) 20 MG capsule Take 1 capsule (20 mg total) by mouth daily.  90 capsule  4  . ONETOUCH DELICA LANCETS 33G MISC 33 g by Does not apply route as directed.  200 each  6  . oxyCODONE (OXYCONTIN) 10 MG 12 hr tablet Take 1 tablet (10 mg total) by mouth every 12 (twelve) hours.  60 tablet  0  . predniSONE (DELTASONE) 20 MG tablet Take 2 tablets (40 mg total) by mouth daily.  60 tablet  2  . sertraline (ZOLOFT) 50 MG tablet Takes 50 mg one day and 100 mg the next day  90 tablet  3  . Sodium Chloride Flush (NORMAL SALINE FLUSH) 0.9 % SOLN       . sulfamethoxazole-trimethoprim (BACTRIM DS) 800-160 MG per tablet Take 1 tablet by mouth daily.  30 tablet  11  . tacrolimus (PROGRAF) 0.5 MG capsule Take 1.5 mg by mouth 2 (two) times daily.      Marland Kitchen testosterone (ANDRODERM) 4 MG/24HR PT24 patch Place 1 patch onto the skin daily.  30 patch  3  . vancomycin (VANCOCIN) 125 MG capsule Take 1 capsule (125 mg total) by mouth 4 (four) times daily.  52 capsule  3  . zinc gluconate 50 MG tablet Take 50 mg by mouth daily.       No current facility-administered medications for this visit.   Facility-Administered Medications Ordered in Other Visits  Medication Dose Route Frequency Provider Last Rate Last Dose  . 0.9 %  sodium chloride infusion   Intravenous Continuous Courtenay Creger G Kayliee Atienza, PA-C 500 mL/hr at 03/12/13 0900    . sodium chloride 0.9 % injection 10 mL  10  mL Intravenous PRN Lowella Dell, MD   10 mL at 08/11/12 1606    OBJECTIVE: Middle-aged white male in no acute distress. Filed Vitals:   05/26/13 1152  BP: 145/80  Pulse: 71  Temp: 98.3 F (36.8 C)   Resp: 20     Body mass index is 28.51 kg/(m^2).   Filed Weights   05/26/13 1152  Weight: 182 lb 1.6 oz (82.6 kg)   ECOG FS: 2  Face shows no rash Sclerae unicteric Lungs no rales or rhonchi, no wheezes Heart regular rate and rhythm Abdomen  soft, nontender, positive bowel sounds Bilateral lower extremity edema, 2+ and equal bilaterally  The right foot was a examined once again, and the skin on the top of the foot is clear, with no current lesions or skin changes. Neuro: nonfocal, well oriented, positive affect Multiple ecchymoses noted on the hands and upper extremities    LAB RESULTS:  CBC    Component Value Date/Time   WBC 6.4 05/26/2013 1139   WBC 5.4 03/18/2013 0615   RBC 3.32* 05/26/2013 1139   RBC 3.17* 03/18/2013 0615   HGB 10.5* 05/26/2013 1139   HGB 9.9* 03/18/2013 0615   HCT 32.2* 05/26/2013 1139   HCT 28.8* 03/18/2013 0615   PLT 62* 05/26/2013 1139   PLT 92* 03/18/2013 0615   MCV 97.0 05/26/2013 1139   MCV 90.9 03/18/2013 0615   MCH 31.6 05/26/2013 1139   MCH 31.2 03/18/2013 0615   MCHC 32.6 05/26/2013 1139   MCHC 34.4 03/18/2013 0615   RDW 15.2* 05/26/2013 1139   RDW 14.7 03/18/2013 0615   LYMPHSABS 1.5 05/26/2013 1139   LYMPHSABS 1.4 03/18/2013 0615   MONOABS 0.3 05/26/2013 1139   MONOABS 0.3 03/18/2013 0615   EOSABS 0.0 05/26/2013 1139   EOSABS 0.0 03/18/2013 0615   BASOSABS 0.0 05/26/2013 1139   BASOSABS 0.0 03/18/2013 0615        Chemistry      Component Value Date/Time   NA 132* 05/26/2013 1139   NA 134* 04/18/2013 1218   K 4.7 05/26/2013 1139   K 4.9 04/18/2013 1218   CL 100 05/26/2013 1139   CL 106 04/18/2013 1218   CO2 26 05/26/2013 1139   CO2 21 04/18/2013 1218   BUN 35.8* 05/26/2013 1139   BUN 44* 04/18/2013 1218   CREATININE 1.0 05/26/2013 1139   CREATININE 0.95 04/18/2013 1218      Component Value Date/Time   CALCIUM 8.9 05/26/2013 1139   CALCIUM 9.0 04/18/2013 1218   ALKPHOS 119 05/26/2013 1139   ALKPHOS 75 04/18/2013 1218   AST 55* 05/26/2013 1139   AST 24 04/18/2013 1218   ALT  38 05/26/2013 1139   ALT 33 04/18/2013 1218   BILITOT 0.46 05/26/2013 1139   BILITOT 0.2* 04/18/2013 1218     Magnesium  1.8  05/26/2013    1.9  05/23/2013    2.2  05/06/2013    1.9  04/20/2013    1.8  04/18/2013    2.2  04/13/2013  IgG   286  05/06/2013     99  04/18/2013    308  03/14/2013    124  02/24/2013   STUDIES:  Mr Lumbar Spine W Wo Contrast  05/10/2013   *RADIOLOGY REPORT*  Clinical Data: Low back pain.  Leukemia with bone marrow transplant  MRI LUMBAR SPINE WITHOUT AND WITH CONTRAST  Technique:  Multiplanar and multiecho pulse sequences of the lumbar spine were obtained without and with intravenous contrast.  Contrast: 15mL MULTIHANCE GADOBENATE DIMEGLUMINE 529 MG/ML IV SOLN  Comparison: Lumbar radiographs 12/14/2012  Findings: Moderate compression fracture of T12 which was present previously.  However there is mild edema and enhancement in the superior plate suggesting a superimposed mild acute component.  Moderate compression fracture of L3.  This was present previously but has progressed.  There is mild bone marrow edema and enhancement involving the superior endplate indicating a superimposed acute fracture.  Mild compression fracture of L4 with bone marrow edema and enhancement in the superior endplate indicating a recent fracture.  Mild to moderate fracture of the superior plate of L5 with mild enhancement of the superior plate indicating healing fracture.  These fractures appear to be benign without associated mass or bone marrow infiltration.  Conus medullaris is normal and terminates at L1-2.  T12-L1:  Disc degeneration with central disc and osteophyte causing mild spinal stenosis  L1-2:  Mild disc and mild facet degeneration  L2-3:  Disc bulging and moderate facet hypertrophy causing mild to moderate spinal stenosis  L3-4:  Mild disc bulging.  Moderate facet hypertrophy with mild spinal stenosis  L4-5:  Moderate spinal stenosis secondary to spondylosis and facet hypertrophy.   There is foraminal narrowing bilaterally  L5 S1:  Mild spondylosis and facet degeneration.  IMPRESSION: Compression fractures involving T12, L2, L3, L4, and L5.  These show mild superior endplate edema enhancement suggesting acute to subacute fractures with healing.  Multilevel degenerative change throughout the lumbar spine.  The spinal stenosis is most severe at L4-5 where there is moderate stenosis of the canal.   Original Report Authenticated By: Janeece Riggers, M.D.      ASSESSMENT: 67 y.o. [place] man with a history of well-differentiated lymphocytic lymphoma/ chronic lymphoid leukemia initially diagnosed in 2000, not requiring intervention until 2006; with multiple chromosomal abnormalities.  His treatment history is as follows:  (1) fludarabine/cyclophosphamide/rituximab x5 completed May 2007.   (2) rituximab for 8 doses October 2010, with partial response   (3) Leustatin and ofatumumab weekly x8 July to September 2011 followed by maintenance ofatumumab maintenance ofatumumab every 2 months, with initial response but rising counts September 2012   (4) status-post unrelated donor stem-cell transplant 02/24/2012 at [facility]  (a) conditioning regimen consisted of fludarabine + TBI at 200 cGy, followed by rituximab x27;  (b) CMV reactivation x3 (patient CMV positive, donor negative), s/p ganciclovir treatment; 3d reactivation August 2013, s/p gancyclovir, with negative PCR mid-September 2013; last gancyclovir dose 10/06/2012 (c) Chronic GVHD: involving gut and skin, treated with steroids, tacrolimus and MMF.  MMF was eventually d/c'd and tacrolimus currently at a dose of 1.5mg  BID (d) atrial fibrillation: resolved on brief amiodarone regimen (e) steroid-induced myopathy: improving  (f) hypomagnesemia: improved after d/c gancyclovir (g) hypogammaglobulinemia: s/p IVIG most recently 01/07/2013. (h) history of elevated triglycerides (606 on 07/14/2012)  (i) adrenal insufficiency: on  prednisone and budesonide (j) pancytopenia, improved  (5) restaging studies September 2013  including CT scans, flow cytometry, and bone marrow biopsy, showed no evidence of residual chronic lymphoid leukemia.  (6) recurrent GVHD (skin rash, mouth changes, severe diarrhea and gastric/duodenal/colonic biopsies 11/17/2012 c/w GVHD grade 2) now only remaining sign is bothersome diarrhea; c diff negative x3; continuing current regimen   (7) severe malnutrition -- on VITAL supplement in addition to regular diet; on Marinol for anorexia  (8) testosterone deficiency--on patch   (9) deconditioning: ongoing REHAB   (10) mild dehydration: encouraged increased po fluids; receives IVF support w magnesium three times weekly  (11)  steroid-induced osteoporosis with compression fractures: received pamidronate 12/18/2012   (12) nausea: improved on current meds  (13)  Positive c.diff, 03/08/2013, on Flagyl 500 mg TID x 20 days, then on oral vanco with Questran, showing improvement  (14) hyperkalemia, improving  (15)  Hypertension, on labetalol, cardizem, and furosemide  (16) steroid induced hyperglycemia, on sterlix and 70/30 insulin  PLAN: Saunders will receive IV fluids and magnesium tomorrow, 05/27/13, and we will continue with our current regimen of fluids and magnesium twice weekly, with office visits every other week.  He will continue to follow his glucose and BP very closely at home.  His blood pressure is elevated throughout the day, but seems to go down after he takes his hypertensive medications. For now, he will continue on Cardizem 240 mg daily, labetalol twice daily, and Lasix at 20 mg daily. His current dose of prednisone is down to 30 mg currently, and we'll decrease further to 25 mg as of Saturday, 05/28/2013.  With regard to his back pain, we are still trying to get Timothy [name] scheduled  with referring Dr. Kerby Nora for consideration of kyphoplasty.   He in the process of scheduling a  dental evaluation, and once that has been completed,  Dr. Darnelle Catalan has suggested beginning zoledronic acid, the plan being to treat every 6 months.   Timothy [name] voices understanding and agreement with this plan, and he and Timothy [name] both know to call if he has any changes or problems.   Zollie Scale    05/26/2013

## 2013-05-26 NOTE — Telephone Encounter (Signed)
Per staff message and POF I have scheduled appts.  JMW  

## 2013-05-27 ENCOUNTER — Ambulatory Visit (HOSPITAL_BASED_OUTPATIENT_CLINIC_OR_DEPARTMENT_OTHER): Payer: BC Managed Care – PPO

## 2013-05-27 ENCOUNTER — Telehealth (HOSPITAL_COMMUNITY): Payer: Self-pay | Admitting: Interventional Radiology

## 2013-05-27 LAB — TACROLIMUS LEVEL: Tacrolimus Lvl: 10 ng/mL (ref 5.0–20.0)

## 2013-05-27 MED ORDER — SODIUM CHLORIDE 0.9 % IV SOLN
Freq: Once | INTRAVENOUS | Status: AC
  Administered 2013-05-27: 15:00:00 via INTRAVENOUS
  Filled 2013-05-27: qty 500

## 2013-05-27 MED ORDER — HEPARIN SOD (PORK) LOCK FLUSH 100 UNIT/ML IV SOLN
500.0000 [IU] | Freq: Once | INTRAVENOUS | Status: AC
  Administered 2013-05-27: 250 [IU] via INTRAVENOUS
  Filled 2013-05-27: qty 5

## 2013-05-27 MED ORDER — SODIUM CHLORIDE 0.9 % IJ SOLN
10.0000 mL | INTRAMUSCULAR | Status: DC | PRN
  Administered 2013-05-27: 10 mL via INTRAVENOUS
  Filled 2013-05-27: qty 10

## 2013-05-27 NOTE — Patient Instructions (Signed)
Dehydration, Adult Dehydration is when you lose more fluids from the body than you take in. Vital organs like the kidneys, brain, and heart cannot function without a proper amount of fluids and salt. Any loss of fluids from the body can cause dehydration.  CAUSES   Vomiting.  Diarrhea.  Excessive sweating.  Excessive urine output.  Fever. SYMPTOMS  Mild dehydration  Thirst.  Dry lips.  Slightly dry mouth. Moderate dehydration  Very dry mouth.  Sunken eyes.  Skin does not bounce back quickly when lightly pinched and released.  Dark urine and decreased urine production.  Decreased tear production.  Headache. Severe dehydration  Very dry mouth.  Extreme thirst.  Rapid, weak pulse (more than 100 beats per minute at rest).  Cold hands and feet.  Not able to sweat in spite of heat and temperature.  Rapid breathing.  Blue lips.  Confusion and lethargy.  Difficulty being awakened.  Minimal urine production.  No tears. DIAGNOSIS  Your caregiver will diagnose dehydration based on your symptoms and your exam. Blood and urine tests will help confirm the diagnosis. The diagnostic evaluation should also identify the cause of dehydration. TREATMENT  Treatment of mild or moderate dehydration can often be done at home by increasing the amount of fluids that you drink. It is best to drink small amounts of fluid more often. Drinking too much at one time can make vomiting worse. Refer to the home care instructions below. Severe dehydration needs to be treated at the hospital where you will probably be given intravenous (IV) fluids that contain water and electrolytes. HOME CARE INSTRUCTIONS   Ask your caregiver about specific rehydration instructions.  Drink enough fluids to keep your urine clear or pale yellow.  Drink small amounts frequently if you have nausea and vomiting.  Eat as you normally do.  Avoid:  Foods or drinks high in sugar.  Carbonated  drinks.  Juice.  Extremely hot or cold fluids.  Drinks with caffeine.  Fatty, greasy foods.  Alcohol.  Tobacco.  Overeating.  Gelatin desserts.  Wash your hands well to avoid spreading bacteria and viruses.  Only take over-the-counter or prescription medicines for pain, discomfort, or fever as directed by your caregiver.  Ask your caregiver if you should continue all prescribed and over-the-counter medicines.  Keep all follow-up appointments with your caregiver. SEEK MEDICAL CARE IF:  You have abdominal pain and it increases or stays in one area (localizes).  You have a rash, stiff neck, or severe headache.  You are irritable, sleepy, or difficult to awaken.  You are weak, dizzy, or extremely thirsty. SEEK IMMEDIATE MEDICAL CARE IF:   You are unable to keep fluids down or you get worse despite treatment.  You have frequent episodes of vomiting or diarrhea.  You have blood or green matter (bile) in your vomit.  You have blood in your stool or your stool looks black and tarry.  You have not urinated in 6 to 8 hours, or you have only urinated a small amount of very dark urine.  You have a fever.  You faint. MAKE SURE YOU:   Understand these instructions.  Will watch your condition.  Will get help right away if you are not doing well or get worse. Document Released: 12/08/2005 Document Revised: 03/01/2012 Document Reviewed: 07/28/2011 Exit[employer] Patient Information 2014 [employer].  

## 2013-05-30 ENCOUNTER — Ambulatory Visit (HOSPITAL_BASED_OUTPATIENT_CLINIC_OR_DEPARTMENT_OTHER): Payer: BC Managed Care – PPO

## 2013-05-30 ENCOUNTER — Other Ambulatory Visit (HOSPITAL_BASED_OUTPATIENT_CLINIC_OR_DEPARTMENT_OTHER): Payer: BC Managed Care – PPO

## 2013-05-30 DIAGNOSIS — C9110 Chronic lymphocytic leukemia of B-cell type not having achieved remission: Secondary | ICD-10-CM

## 2013-05-30 DIAGNOSIS — E099 Drug or chemical induced diabetes mellitus without complications: Secondary | ICD-10-CM

## 2013-05-30 DIAGNOSIS — C9590 Leukemia, unspecified not having achieved remission: Secondary | ICD-10-CM

## 2013-05-30 LAB — CBC WITH DIFFERENTIAL/PLATELET
Eosinophils Absolute: 0 10*3/uL (ref 0.0–0.5)
HCT: 31.3 % — ABNORMAL LOW (ref 38.4–49.9)
LYMPH%: 30.7 % (ref 14.0–49.0)
MONO#: 0.4 10*3/uL (ref 0.1–0.9)
NEUT#: 4 10*3/uL (ref 1.5–6.5)
NEUT%: 63.5 % (ref 39.0–75.0)
Platelets: 67 10*3/uL — ABNORMAL LOW (ref 140–400)
WBC: 6.3 10*3/uL (ref 4.0–10.3)

## 2013-05-30 LAB — COMPREHENSIVE METABOLIC PANEL (CC13)
AST: 47 U/L — ABNORMAL HIGH (ref 5–34)
Albumin: 3 g/dL — ABNORMAL LOW (ref 3.5–5.0)
Alkaline Phosphatase: 116 U/L (ref 40–150)
CO2: 24 mEq/L (ref 22–29)
Chloride: 102 mEq/L (ref 98–107)
Total Protein: 5.5 g/dL — ABNORMAL LOW (ref 6.4–8.3)

## 2013-05-30 MED ORDER — SODIUM CHLORIDE 0.9 % IJ SOLN
10.0000 mL | INTRAMUSCULAR | Status: DC | PRN
  Administered 2013-05-30: 10 mL via INTRAVENOUS
  Filled 2013-05-30: qty 10

## 2013-05-30 MED ORDER — HEPARIN SOD (PORK) LOCK FLUSH 100 UNIT/ML IV SOLN
500.0000 [IU] | Freq: Once | INTRAVENOUS | Status: AC
  Administered 2013-05-30: 500 [IU] via INTRAVENOUS
  Filled 2013-05-30: qty 5

## 2013-05-30 MED ORDER — SODIUM CHLORIDE 0.9 % IV SOLN
INTRAVENOUS | Status: DC
  Administered 2013-05-30: 11:00:00 via INTRAVENOUS

## 2013-05-30 MED ORDER — MAGNESIUM SULFATE 50 % IJ SOLN
Freq: Once | INTRAMUSCULAR | Status: AC
  Administered 2013-05-30: 11:00:00 via INTRAVENOUS
  Filled 2013-05-30: qty 500

## 2013-05-30 NOTE — Patient Instructions (Addendum)
Dehydration, Adult  Dehydration means your body does not have as much fluid as it needs. Your kidneys, brain, and heart will not work properly without the right amount of fluids and salt.   HOME CARE   Ask your doctor how to replace body fluid losses (rehydrate).   Drink enough fluids to keep your pee (urine) clear or pale yellow.   Drink small amounts of fluids often if you feel sick to your stomach (nauseous) or throw up (vomit).   Eat like you normally do.   Avoid:   Foods or drinks high in sugar.   Bubbly (carbonated) drinks.   Juice.   Very hot or cold fluids.   Drinks with caffeine.   Fatty, greasy foods.   Alcohol.   Tobacco.   Eating too much.   Gelatin desserts.   Wash your hands to avoid spreading germs (bacteria, viruses).   Only take medicine as told by your doctor.   Keep all doctor visits as told.  GET HELP RIGHT AWAY IF:    You cannot drink something without throwing up.   You get worse even with treatment.   Your vomit has blood in it or looks greenish.   Your poop (stool) has blood in it or looks black and tarry.   You have not peed in 6 to 8 hours.   You pee a small amount of very dark pee.   You have a fever.   You pass out (faint).   You have belly (abdominal) pain that gets worse or stays in one spot (localizes).   You have a rash, stiff neck, or bad headache.   You get easily annoyed, sleepy, or are hard to wake up.   You feel weak, dizzy, or very thirsty.  MAKE SURE YOU:    Understand these instructions.   Will watch your condition.   Will get help right away if you are not doing well or get worse.  Document Released: 10/04/2009 Document Revised: 03/01/2012 Document Reviewed: 07/28/2011  ExitCare Patient Information 2014 [employer].

## 2013-05-31 LAB — IGG, IGA, IGM: IgM, Serum: 12 mg/dL — ABNORMAL LOW (ref 41–251)

## 2013-06-01 ENCOUNTER — Other Ambulatory Visit: Payer: Self-pay | Admitting: Physician Assistant

## 2013-06-01 ENCOUNTER — Other Ambulatory Visit: Payer: Self-pay | Admitting: Oncology

## 2013-06-01 ENCOUNTER — Ambulatory Visit (HOSPITAL_BASED_OUTPATIENT_CLINIC_OR_DEPARTMENT_OTHER): Payer: BC Managed Care – PPO

## 2013-06-01 ENCOUNTER — Telehealth: Payer: Self-pay | Admitting: *Deleted

## 2013-06-01 ENCOUNTER — Ambulatory Visit (HOSPITAL_COMMUNITY)
Admission: RE | Admit: 2013-06-01 | Discharge: 2013-06-01 | Disposition: A | Discharge status: Home / Self Care | Payer: BC Managed Care – PPO | Source: Ambulatory Visit | Attending: Oncology | Admitting: Oncology

## 2013-06-01 DIAGNOSIS — G8929 Other chronic pain: Secondary | ICD-10-CM

## 2013-06-01 DIAGNOSIS — C9110 Chronic lymphocytic leukemia of B-cell type not having achieved remission: Secondary | ICD-10-CM

## 2013-06-01 MED ORDER — SODIUM CHLORIDE 0.9 % IV SOLN
INTRAVENOUS | Status: DC
  Administered 2013-06-01: 14:00:00 via INTRAVENOUS

## 2013-06-01 MED ORDER — MAGNESIUM SULFATE 50 % IJ SOLN
Freq: Once | INTRAMUSCULAR | Status: AC
  Administered 2013-06-01: 14:00:00 via INTRAVENOUS
  Filled 2013-06-01: qty 500

## 2013-06-01 NOTE — Telephone Encounter (Signed)
Per staff message I have moved appt from 6/26 to 6/25.  JMW

## 2013-06-01 NOTE — Patient Instructions (Signed)
Patient aware of next appointment; discharged home with no complaints. 

## 2013-06-02 ENCOUNTER — Telehealth: Payer: Self-pay | Admitting: Oncology

## 2013-06-02 ENCOUNTER — Telehealth: Payer: Self-pay | Admitting: *Deleted

## 2013-06-02 ENCOUNTER — Ambulatory Visit: Payer: Self-pay

## 2013-06-02 ENCOUNTER — Other Ambulatory Visit: Payer: Self-pay | Admitting: Oncology

## 2013-06-02 NOTE — Telephone Encounter (Signed)
This RN received call from pt's wife- Timothy [name]- stating Timothy [name] is needing records and most recent labs per IR consult for kyphoplasty procedure. Timothy [name] also stated recommendation per [facility] for IgG to be greater then 500 for surgical procedures.  This RN obtained most recent lab values on 05/30/2013 with IgG of 706.  Dr Ferd Glassing with IR note not available at present.  Note pertaining to above with lab results faxed per number given by [facility] as [phone] attention Dr Si Gaul. She also left direct physician line phone number of [phone].  Faxed available records.

## 2013-06-06 ENCOUNTER — Ambulatory Visit (HOSPITAL_BASED_OUTPATIENT_CLINIC_OR_DEPARTMENT_OTHER): Payer: BC Managed Care – PPO

## 2013-06-06 ENCOUNTER — Other Ambulatory Visit (HOSPITAL_BASED_OUTPATIENT_CLINIC_OR_DEPARTMENT_OTHER): Payer: BC Managed Care – PPO | Admitting: Lab

## 2013-06-06 ENCOUNTER — Other Ambulatory Visit: Payer: Self-pay | Admitting: Physician Assistant

## 2013-06-06 ENCOUNTER — Other Ambulatory Visit: Payer: Self-pay | Admitting: *Deleted

## 2013-06-06 DIAGNOSIS — C9110 Chronic lymphocytic leukemia of B-cell type not having achieved remission: Secondary | ICD-10-CM

## 2013-06-06 DIAGNOSIS — T380X5A Adverse effect of glucocorticoids and synthetic analogues, initial encounter: Secondary | ICD-10-CM

## 2013-06-06 DIAGNOSIS — E139 Other specified diabetes mellitus without complications: Secondary | ICD-10-CM

## 2013-06-06 DIAGNOSIS — E099 Drug or chemical induced diabetes mellitus without complications: Secondary | ICD-10-CM

## 2013-06-06 LAB — CBC WITH DIFFERENTIAL/PLATELET
BASO%: 0 % (ref 0.0–2.0)
HCT: 34.5 % — ABNORMAL LOW (ref 38.4–49.9)
MCHC: 33 g/dL (ref 32.0–36.0)
MONO#: 0.5 10*3/uL (ref 0.1–0.9)
NEUT#: 3.6 10*3/uL (ref 1.5–6.5)
RBC: 3.53 10*6/uL — ABNORMAL LOW (ref 4.20–5.82)
WBC: 6.1 10*3/uL (ref 4.0–10.3)
lymph#: 1.9 10*3/uL (ref 0.9–3.3)

## 2013-06-06 LAB — COMPREHENSIVE METABOLIC PANEL (CC13)
ALT: 33 U/L (ref 0–55)
Albumin: 3.2 g/dL — ABNORMAL LOW (ref 3.5–5.0)
CO2: 22 mEq/L (ref 22–29)
Calcium: 9.3 mg/dL (ref 8.4–10.4)
Chloride: 102 mEq/L (ref 98–107)
Sodium: 133 mEq/L — ABNORMAL LOW (ref 136–145)
Total Protein: 5.8 g/dL — ABNORMAL LOW (ref 6.4–8.3)

## 2013-06-06 LAB — MAGNESIUM (CC13): Magnesium: 1.9 mg/dl (ref 1.5–2.5)

## 2013-06-06 MED ORDER — SODIUM CHLORIDE 0.9 % IJ SOLN
10.0000 mL | INTRAMUSCULAR | Status: DC | PRN
  Administered 2013-06-06: 10 mL via INTRAVENOUS
  Filled 2013-06-06: qty 10

## 2013-06-06 MED ORDER — HEPARIN SOD (PORK) LOCK FLUSH 100 UNIT/ML IV SOLN
500.0000 [IU] | Freq: Once | INTRAVENOUS | Status: AC
  Administered 2013-06-06: 250 [IU] via INTRAVENOUS
  Filled 2013-06-06: qty 5

## 2013-06-06 MED ORDER — SODIUM CHLORIDE 0.9 % IV SOLN
Freq: Once | INTRAVENOUS | Status: AC
  Administered 2013-06-06: 12:00:00 via INTRAVENOUS
  Filled 2013-06-06: qty 500

## 2013-06-06 NOTE — Patient Instructions (Addendum)
Today you received magnesium through your IV.

## 2013-06-08 ENCOUNTER — Other Ambulatory Visit: Payer: Self-pay | Admitting: Oncology

## 2013-06-08 ENCOUNTER — Telehealth: Payer: Self-pay | Admitting: Oncology

## 2013-06-08 ENCOUNTER — Ambulatory Visit (HOSPITAL_BASED_OUTPATIENT_CLINIC_OR_DEPARTMENT_OTHER): Payer: BC Managed Care – PPO | Admitting: Oncology

## 2013-06-08 ENCOUNTER — Telehealth: Payer: Self-pay | Admitting: *Deleted

## 2013-06-08 ENCOUNTER — Other Ambulatory Visit (HOSPITAL_BASED_OUTPATIENT_CLINIC_OR_DEPARTMENT_OTHER): Payer: BC Managed Care – PPO | Admitting: Lab

## 2013-06-08 ENCOUNTER — Ambulatory Visit (HOSPITAL_BASED_OUTPATIENT_CLINIC_OR_DEPARTMENT_OTHER): Payer: BC Managed Care – PPO

## 2013-06-08 VITALS — BP 161/80 | HR 73 | Temp 97.6°F | Resp 20 | Ht 67.0 in | Wt 182.0 lb

## 2013-06-08 DIAGNOSIS — C9110 Chronic lymphocytic leukemia of B-cell type not having achieved remission: Secondary | ICD-10-CM

## 2013-06-08 DIAGNOSIS — D89811 Chronic graft-versus-host disease: Secondary | ICD-10-CM

## 2013-06-08 DIAGNOSIS — E099 Drug or chemical induced diabetes mellitus without complications: Secondary | ICD-10-CM

## 2013-06-08 DIAGNOSIS — Z9489 Other transplanted organ and tissue status: Secondary | ICD-10-CM

## 2013-06-08 LAB — COMPREHENSIVE METABOLIC PANEL (CC13)
ALT: 33 U/L (ref 0–55)
Alkaline Phosphatase: 107 U/L (ref 40–150)
Potassium: 4.9 mEq/L (ref 3.5–5.1)
Sodium: 132 mEq/L — ABNORMAL LOW (ref 136–145)
Total Bilirubin: 0.36 mg/dL (ref 0.20–1.20)
Total Protein: 5.6 g/dL — ABNORMAL LOW (ref 6.4–8.3)

## 2013-06-08 LAB — CBC WITH DIFFERENTIAL/PLATELET
BASO%: 0.3 % (ref 0.0–2.0)
LYMPH%: 19.8 % (ref 14.0–49.0)
MCHC: 34.4 g/dL (ref 32.0–36.0)
MCV: 97 fL (ref 79.3–98.0)
MONO#: 0.4 10*3/uL (ref 0.1–0.9)
MONO%: 5.6 % (ref 0.0–14.0)
Platelets: 75 10*3/uL — ABNORMAL LOW (ref 140–400)
RBC: 3.37 10*6/uL — ABNORMAL LOW (ref 4.20–5.82)
RDW: 15.7 % — ABNORMAL HIGH (ref 11.0–14.6)
WBC: 6.5 10*3/uL (ref 4.0–10.3)

## 2013-06-08 MED ORDER — SODIUM CHLORIDE 0.9 % IV SOLN
Freq: Once | INTRAVENOUS | Status: AC
  Administered 2013-06-08: 14:00:00 via INTRAVENOUS
  Filled 2013-06-08: qty 500

## 2013-06-08 NOTE — Progress Notes (Signed)
ID: Timothy [name]   DOB: Jul 04, 1946  MR#: [id number]  WUJ#:[id number]  NFA:OZHY,Q DOUGLAS, MD SU: OTHER MD: Donzetta Starch, Romero Belling  HISTORY OF PRESENT ILLNESS: We have very complete records from Dr. Sydnee Levans in [place], and in summary:  The patient was initially diagnosed in August 2000, with a white cell count of 23,600, but normal hemoglobin and platelets, and no significant symptomatology. Over the next several years his white cell count drifted up, and he eventually developed some symptoms of night sweats in particular, leading to treatment with fludarabine, Cytoxan and rituxan for five cycles given between December 2006 and May 2007.  We have CT scans from June 2006, November 2006 and April 2007, and comparing the November 2006 and April 2007 scans, there was near complete response. He had subsequent therapy in [place] as detailed below, but with decreased response, leading to allogeneic stem-cell transplant at the [facility] 02/24/2012.  INTERVAL HISTORY: Timothy [name] returns with his wife Timothy [name]  for followup of his CLL, status post allogeneic stem cell transplant in [place] in March 2013.   REVIEW OF SYSTEMS: Timothy [name]'s weight has stabilized. He is now having 3 normal bowel movements daily. He has no skin rash or skin roughness to report. He is hydrating himself well. The biggest problem he is having is the back pain from his collapsed vertebrae, and he is planning to undergo kyphoplasty for this. He is having trouble getting that approval by his insurance, for some reason. There have been no unusual headaches, visual changes, thrush, sore throat, cough, phlegm production, pleurisy, shortness of breath, fever, bleeding, or bladder issues. He continues to be tremulous. He is walking perhaps 30 minutes daily. A detailed review of systems today was otherwise stable.  PAST MEDICAL HISTORY: Past Medical History  Diagnosis Date  . 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 Diverticular disease   . Hyperlipidemia   . Obesity   . Hypertension   . Hiatal hernia   . CMV (cytomegalovirus) antibody positive     pre-transplant, with seroconversion x2 pst-transplant  . Right bundle branch block     pre-transplant  . CKD (chronic kidney disease) stage 2, GFR 60-89 ml/min   . Pancytopenia   . Steroid-induced diabetes   . Atrial fibrillation     post-transplant  . Myopathy   . 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 Leukemia, chronic lymphoid   . 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 Diarrhea in adult patient 12/05/2012    Due to active GVHD  . CLL (chronic lymphocytic leukemia) 12/05/2012    Dx 07/1999; started Rx 12/06  AlloBMT 3/13  . Rash of face 12/05/2012    Due to GVHD  . Hypomagnesemia 01/26/2013    PAST SURGICAL HISTORY: Past Surgical History  Procedure Laterality Date  . Tonsillectomy and adenoidectomy    . Bone marrow transplant    . Flexible sigmoidoscopy  11/17/2012    Procedure: FLEXIBLE SIGMOIDOSCOPY;  Surgeon: Petra Kuba, MD;  Location: [facility] ENDOSCOPY;  Service: Endoscopy;  Laterality: N/A;  Dr Ewing Schlein states will be admitted to rooom 1339 11/16/12  . Esophagogastroduodenoscopy  11/17/2012    Procedure: ESOPHAGOGASTRODUODENOSCOPY (EGD);  Surgeon: Petra Kuba, MD;  Location: [facility];  Service: Endoscopy;  Laterality: N/A;    FAMILY HISTORY Family History  Problem Relation Age of Onset  . Cancer Father    The patient's father died  from complications of chronic lymphocytic leukemia at the age of 52.  It had been diagnosed seven years before when he was 18.  The patient is enrolled in a familial chronic lymphocytic leukemia study out of the [facility].  The patient's mother is 69, alive, unfortunately suffering with dementia, and he has a brother, 42, who is otherwise in fair health.   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  His wife of >40 years, Timothy [name], is a homemaker.  Their daughter, [name], lives in [place].  She also is a Futures trader.  The patient has an 59 year old grandson and an 68-year-old granddaughter, and that is really the main reason he moved to this area.  He is a International aid/development worker.     ADVANCED DIRECTIVES: In place  HEALTH MAINTENANCE: History  Substance Use Topics  . Smoking status: Never Smoker   . Smokeless tobacco: Never Used  . Alcohol Use: No     Colonoscopy:  PSA:  Bone density:  Lipid panel:  Allergies  Allergen Reactions  . Benadryl (Diphenhydramine Hcl)     "Restless leg syndrome"    Current Outpatient Prescriptions  Medication Sig Dispense Refill  . acyclovir (ZOVIRAX) 400 MG tablet Take 2 tablets (800 mg total) by mouth 2 (two) times daily.  60 tablet  12  . ANDRODERM 4 MG/24HR PT24 patch APPLY 1 PATCH TO SKIN, CHANGE PATCH DAILY. REMOVE OLD PATCH, FOLD & DISCARD AWAY FROM PETS & CHILDREN  30 patch  3  . antiseptic oral rinse (BIOTENE) LIQD 15 mLs by Mouth Rinse route 2 times daily at 12 noon and 4 pm.  237 mL  6  . B-D UF III MINI PEN NEEDLES 31G X 5 MM MISC       . Blood Glucose Monitoring Suppl (ONE TOUCH ULTRA 2) W/DEVICE KIT       . budesonide (ENTOCORT EC) 3 MG 24 hr capsule Take 9 mg by mouth 3 (three) times daily.      . Calcium Citrate (CITRACAL PO) Take 1,200 mg by mouth 2 (two) times daily.      . cholecalciferol (VITAMIN D) 1000 UNITS tablet Take 1,000 Units by mouth every evening.       . cholestyramine (QUESTRAN) 4 G packet Take 1 packet by mouth 2 (two) times daily with a meal.  60 each  1  . diltiazem (CARDIZEM CD) 240 MG 24 hr capsule Take 1 capsule (240 mg total) by mouth daily.  30 capsule  3  . fluconazole (DIFLUCAN) 100 MG tablet Take 1 tablet (100 mg total) by mouth every morning.  30 tablet  12  . furosemide (LASIX) 20 MG  tablet Take 1 tablet (20 mg total) by mouth every morning.  30 tablet  2  . glucose blood (ONE TOUCH ULTRA TEST) test strip As directed  150 each  6  . Heparin Lock Flush (HEPARIN FLUSH, PORCINE,) 100 UNIT/ML injection       . HYDROcodone-acetaminophen (NORCO) 10-325 MG per tablet 1-2 po q 6 hours prn pain  60 tablet  1  . insulin aspart protamine- aspart (NOVOLOG 70/30) (70-30) 100 UNIT/ML injection Inject 0.18 mLs (18 Units total) into the skin daily with breakfast.  15 mL  0  . insulin aspart protamine- aspart (NOVOLOG 70/30) (70-30) 100 UNIT/ML injection Inject 0.09 mLs (9 Units total) into the skin daily with supper.  15  mL  0  . labetalol (NORMODYNE) 200 MG tablet Take 1 tablet (200 mg total) by mouth 2 (two) times daily.  60 tablet  12  . Lidocaine-Hydrocortisone Ace 3-0.5 % KIT       . Multiple Vitamins-Minerals (CENTRUM SILVER ADULT 50+) TABS Take 1 tablet by mouth every evening.       . mupirocin ointment (BACTROBAN) 2 %       . nateglinide (STARLIX) 120 MG tablet Take 1 tablet (120 mg total) by mouth 3 (three) times daily before meals.  90 tablet  11  . Nutritional Supplements (FEEDING SUPPLEMENT, VITAL 1.5 CAL,) LIQD Take 237 mLs by mouth 2 (two) times daily.  24 Can  12  . omeprazole (PRILOSEC) 20 MG capsule Take 1 capsule (20 mg total) by mouth daily.  90 capsule  4  . ONETOUCH DELICA LANCETS 33G MISC 33 g by Does not apply route as directed.  200 each  6  . oxyCODONE (OXYCONTIN) 10 MG 12 hr tablet Take 1 tablet (10 mg total) by mouth every 12 (twelve) hours.  60 tablet  0  . predniSONE (DELTASONE) 20 MG tablet Take 2 tablets (40 mg total) by mouth daily.  60 tablet  2  . sertraline (ZOLOFT) 50 MG tablet Takes 50 mg one day and 100 mg the next day  90 tablet  3  . Sodium Chloride Flush (NORMAL SALINE FLUSH) 0.9 % SOLN       . sulfamethoxazole-trimethoprim (BACTRIM DS) 800-160 MG per tablet Take 1 tablet by mouth daily.  30 tablet  11  . tacrolimus (PROGRAF) 0.5 MG capsule Take 1.5 mg  by mouth 2 (two) times daily.      . vancomycin (VANCOCIN) 125 MG capsule Take 1 capsule (125 mg total) by mouth 4 (four) times daily.  52 capsule  3  . zinc gluconate 50 MG tablet Take 50 mg by mouth daily.       No current facility-administered medications for this visit.   Facility-Administered Medications Ordered in Other Visits  Medication Dose Route Frequency Provider Last Rate Last Dose  . 0.9 %  sodium chloride infusion   Intravenous Continuous Amy G Berry, PA-C 500 mL/hr at 03/12/13 0900    . sodium chloride 0.9 % injection 10 mL  10 mL Intravenous PRN Lowella Dell, MD   10 mL at 08/11/12 1606    OBJECTIVE: Middle-aged white male in no acute distress. Filed Vitals:   06/08/13 1453  BP: 161/80  Pulse: 73  Temp: 97.6 F (36.4 C)  Resp: 20     Body mass index is 28.5 kg/(m^2).   Filed Weights   06/08/13 1453  Weight: 182 lb (82.555 kg)   ECOG FS: 2  Face shows no rash, skin is smooth and soft Sclerae unicteric Lungs no rales or rhonchi, no wheezes Heart regular rate and rhythm Abdomen  soft, nontender, positive bowel sounds Bilateral lower extremity edema, 2+ and equal bilaterally  Neuro: nonfocal, well oriented, positive affect Few ecchymoses noted on the hands and upper extremities, unchanged    LAB RESULTS:  CBC    Component Value Date/Time   WBC 6.5 06/08/2013 1313   WBC 5.4 03/18/2013 0615   RBC 3.37* 06/08/2013 1313   RBC 3.17* 03/18/2013 0615   HGB 11.2* 06/08/2013 1313   HGB 9.9* 03/18/2013 0615   HCT 32.6* 06/08/2013 1313   HCT 28.8* 03/18/2013 0615   PLT 75* 06/08/2013 1313   PLT 92* 03/18/2013 0615   MCV  97.0 06/08/2013 1313   MCV 90.9 03/18/2013 0615   MCH 33.4 06/08/2013 1313   MCH 31.2 03/18/2013 0615   MCHC 34.4 06/08/2013 1313   MCHC 34.4 03/18/2013 0615   RDW 15.7* 06/08/2013 1313   RDW 14.7 03/18/2013 0615   LYMPHSABS 1.3 06/08/2013 1313   LYMPHSABS 1.4 03/18/2013 0615   MONOABS 0.4 06/08/2013 1313   MONOABS 0.3 03/18/2013 0615   EOSABS 0.0  06/08/2013 1313   EOSABS 0.0 03/18/2013 0615   BASOSABS 0.0 06/08/2013 1313   BASOSABS 0.0 03/18/2013 0615        Chemistry      Component Value Date/Time   NA 132* 06/08/2013 1313   NA 134* 04/18/2013 1218   K 4.9 06/08/2013 1313   K 4.9 04/18/2013 1218   CL 102 06/08/2013 1313   CL 106 04/18/2013 1218   CO2 21* 06/08/2013 1313   CO2 21 04/18/2013 1218   BUN 46.1* 06/08/2013 1313   BUN 44* 04/18/2013 1218   CREATININE 1.1 06/08/2013 1313   CREATININE 0.95 04/18/2013 1218      Component Value Date/Time   CALCIUM 9.2 06/08/2013 1313   CALCIUM 9.0 04/18/2013 1218   ALKPHOS 107 06/08/2013 1313   ALKPHOS 75 04/18/2013 1218   AST 40* 06/08/2013 1313   AST 24 04/18/2013 1218   ALT 33 06/08/2013 1313   ALT 33 04/18/2013 1218   BILITOT 0.36 06/08/2013 1313   BILITOT 0.2* 04/18/2013 1218     Magnesium  1.8  05/26/2013    1.9  05/23/2013    2.2  05/06/2013    1.9  04/20/2013    1.8  04/18/2013    2.2  04/13/2013  IgG   286  05/06/2013     99  04/18/2013    308  03/14/2013    124  02/24/2013   STUDIES:  Mr Lumbar Spine W Wo Contrast  05/10/2013   *RADIOLOGY REPORT*  Clinical Data: Low back pain.  Leukemia with bone marrow transplant  MRI LUMBAR SPINE WITHOUT AND WITH CONTRAST  Technique:  Multiplanar and multiecho pulse sequences of the lumbar spine were obtained without and with intravenous contrast.  Contrast: 15mL MULTIHANCE GADOBENATE DIMEGLUMINE 529 MG/ML IV SOLN  Comparison: Lumbar radiographs 12/14/2012  Findings: Moderate compression fracture of T12 which was present previously.  However there is mild edema and enhancement in the superior plate suggesting a superimposed mild acute component.  Moderate compression fracture of L3.  This was present previously but has progressed.  There is mild bone marrow edema and enhancement involving the superior endplate indicating a superimposed acute fracture.  Mild compression fracture of L4 with bone marrow edema and enhancement in the superior endplate  indicating a recent fracture.  Mild to moderate fracture of the superior plate of L5 with mild enhancement of the superior plate indicating healing fracture.  These fractures appear to be benign without associated mass or bone marrow infiltration.  Conus medullaris is normal and terminates at L1-2.  T12-L1:  Disc degeneration with central disc and osteophyte causing mild spinal stenosis  L1-2:  Mild disc and mild facet degeneration  L2-3:  Disc bulging and moderate facet hypertrophy causing mild to moderate spinal stenosis  L3-4:  Mild disc bulging.  Moderate facet hypertrophy with mild spinal stenosis  L4-5:  Moderate spinal stenosis secondary to spondylosis and facet hypertrophy.  There is foraminal narrowing bilaterally  L5 S1:  Mild spondylosis and facet degeneration.  IMPRESSION: Compression fractures involving T12, L2, L3, L4, and  L5.  These show mild superior endplate edema enhancement suggesting acute to subacute fractures with healing.  Multilevel degenerative change throughout the lumbar spine.  The spinal stenosis is most severe at L4-5 where there is moderate stenosis of the canal.   Original Report Authenticated By: Janeece Riggers, M.D.      ASSESSMENT: 67 y.o. [place] man with a history of well-differentiated lymphocytic lymphoma/ chronic lymphoid leukemia initially diagnosed in 2000, not requiring intervention until 2006; with multiple chromosomal abnormalities.  His treatment history is as follows:  (1) fludarabine/cyclophosphamide/rituximab x5 completed May 2007.   (2) rituximab for 8 doses October 2010, with partial response   (3) Leustatin and ofatumumab weekly x8 July to September 2011 followed by maintenance ofatumumab maintenance ofatumumab every 2 months, with initial response but rising counts September 2012   (4) status-post unrelated donor stem-cell transplant 02/24/2012 at the [facility]  (a) conditioning regimen consisted of fludarabine + TBI at 200 cGy, followed by rituximab  x27;  (b) CMV reactivation x3 (patient CMV positive, donor negative), s/p ganciclovir treatment; 3d reactivation August 2013, s/p gancyclovir, with negative PCR mid-September 2013; last gancyclovir dose 10/06/2012 (c) Chronic GVHD: involving gut and skin, treated with steroids, tacrolimus and MMF.  MMF was eventually d/c'd and tacrolimus currently at a dose of 1.5mg  BID (d) atrial fibrillation: resolved on brief amiodarone regimen (e) steroid-induced myopathy: improving  (f) hypomagnesemia: improved after d/c gancyclovir (g) hypogammaglobulinemia: s/p IVIG most recently 01/07/2013. (h) history of elevated triglycerides (606 on 07/14/2012)  (i) adrenal insufficiency: on prednisone and budesonide (j) pancytopenia, improved  (5) restaging studies September 2013  including CT scans, flow cytometry, and bone marrow biopsy, showed no evidence of residual chronic lymphoid leukemia.  (6) recurrent GVHD (skin rash, mouth changes, severe diarrhea and gastric/duodenal/colonic biopsies 11/17/2012 c/w GVHD grade 2) now only remaining sign is bothersome diarrhea; c diff negative x3; continuing current regimen   (7) severe malnutrition -- on VITAL supplement in addition to regular diet; on Marinol for anorexia  (8) testosterone deficiency--on patch   (9) deconditioning: ongoing REHAB   (10) mild dehydration: encouraged increased po fluids; receives IVF support w magnesium three times weekly  (11) steroid-induced osteoporosis with compression fractures: received pamidronate 12/18/2012   (12) nausea: improved on current meds  (13)  Positive c.diff, 03/08/2013, on Flagyl 500 mg TID x 20 days, then on oral vanco with Questran, showing improvement  (14) hyperkalemia, improving  (15)  Hypertension, on labetalol, cardizem, and furosemide  (16) steroid induced hyperglycemia, on sterlix and 70/30 insulin  PLAN: Dorsel is looking the best that I have seen him in many months. His diarrhea has normalized so  now he is having 3 normal bowel movements daily, and he knows when they're coming. There are no more accidents. His skin also looks the best it has a. I think we can continue to taper the prednisone, and he is going down to 15 mg a day today. I would be reluctant to go any lower for at least 3 weeks, possibly for, and then we will go to 12.5 mg daily. Eventually perhaps we can get off the prednisone altogether but it will probably take 3-4 months to get there.  Unfortunately his insurance will not pay for the transportation to his trip to [place] and the whole thing would be many thousands of dollars. He is negotiating with [facility] so he can see them again in January or February, when his insurance will pay all costs. If he does achieve that then I will  give him IVIG, since his total protein is low and that has been tentatively set up for June 25.  He is having trouble getting insurance to approve his kyphoplasty. If we can assist him with that we will be glad to. His blood sugar is much better controlled now thanks to fDr. Ellison's help. II think we can back off on the biweekly IV fluids and do them once a week. I am not going to go off the oral vancomycin's, since a very benign and I would paid for him to have C. difficile a third time. He is going to return to see me again in approximately one month. He knows to call for any problems that may develop before that.   Marland KitchenMAGRINAT,Adilyn Humes C    06/08/2013

## 2013-06-08 NOTE — Patient Instructions (Signed)
Follow up with Dr. Darnelle Catalan today for further instructions.

## 2013-06-08 NOTE — Telephone Encounter (Signed)
Per scheduler I have changed appt on 6/25 from ivf to ivig

## 2013-06-09 ENCOUNTER — Other Ambulatory Visit: Payer: Self-pay | Admitting: Radiology

## 2013-06-09 ENCOUNTER — Telehealth: Payer: Self-pay | Admitting: Oncology

## 2013-06-09 ENCOUNTER — Other Ambulatory Visit (HOSPITAL_COMMUNITY): Payer: Self-pay | Admitting: Interventional Radiology

## 2013-06-09 ENCOUNTER — Encounter (HOSPITAL_COMMUNITY): Payer: Self-pay | Admitting: Pharmacy Technician

## 2013-06-09 DIAGNOSIS — M549 Dorsalgia, unspecified: Secondary | ICD-10-CM

## 2013-06-09 DIAGNOSIS — IMO0002 Reserved for concepts with insufficient information to code with codable children: Secondary | ICD-10-CM

## 2013-06-09 LAB — TACROLIMUS LEVEL: Tacrolimus Lvl: 9.6 ng/mL (ref 5.0–20.0)

## 2013-06-09 LAB — IGG, IGA, IGM
IgA: <6 mg/dL — ABNORMAL LOW (ref 68–379)
IgG (Immunoglobin G), Serum: 609 mg/dL — ABNORMAL LOW (ref 650–1600)

## 2013-06-09 NOTE — Progress Notes (Signed)
06/09/2013 No end time noted to Magnesium sulfate. CHart reviewed and end time of infusion reflects to be near 1450hr with saline line flushes at 1453hr. HL

## 2013-06-10 ENCOUNTER — Ambulatory Visit (INDEPENDENT_AMBULATORY_CARE_PROVIDER_SITE_OTHER): Payer: BC Managed Care – PPO | Admitting: Endocrinology

## 2013-06-10 ENCOUNTER — Other Ambulatory Visit: Payer: Self-pay | Admitting: Oncology

## 2013-06-10 ENCOUNTER — Encounter: Payer: Self-pay | Admitting: Endocrinology

## 2013-06-10 VITALS — BP 126/70 | HR 78 | Ht 67.0 in | Wt 181.0 lb

## 2013-06-10 DIAGNOSIS — E1029 Type 1 diabetes mellitus with other diabetic kidney complication: Secondary | ICD-10-CM

## 2013-06-10 NOTE — Patient Instructions (Addendum)
check your blood sugar once a day.  vary the time of day when you check, between before the 3 meals, and at bedtime.  also check if you have symptoms of your blood sugar being too high or too low.  please keep a record of the readings and bring it to your next appointment here.  please call us sooner if your blood sugar goes below 70, or if you have a lot of readings over 200.  Please stop taking the "nateglinide" pill. Please continue the same insulin for now.  However, your insulin requirement will decrease as your insulin decreases.   Please come back for a follow-up appointment in 1 month.

## 2013-06-10 NOTE — Progress Notes (Signed)
Subjective:    Patient ID: Timothy [name], male    DOB: 14-Jan-1946, 67 y.o.   MRN: [id number]  HPI Pt returns for f/u of insulin-requiring DM (dx'ed 2014; he has mild if any neuropathy of the lower extremities; he has associated renal insufficiency; pt had bone-marrow transplant in 2013 in [place], [place]; he was started on insulin in early 2014; he brings a record of his cbg's which i have reviewed today.  He is on tapering prednisone.  he brings a record of his cbg's which i have reviewed today.  It varies from 78-115.  There is no trend throughout the day.  Past Medical History  Diagnosis Date  . 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 Diverticular disease   . Hyperlipidemia   . Obesity   . Hypertension   . Hiatal hernia   . CMV (cytomegalovirus) antibody positive     pre-transplant, with seroconversion x2 pst-transplant  . Right bundle branch block     pre-transplant  . CKD (chronic kidney disease) stage 2, GFR 60-89 ml/min   . Pancytopenia   . Steroid-induced diabetes   . Atrial fibrillation     post-transplant  . Myopathy   . 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 Leukemia, chronic lymphoid   . 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 Diarrhea in adult patient 12/05/2012    Due to active GVHD  . CLL (chronic lymphocytic leukemia) 12/05/2012    Dx 07/1999; started Rx 12/06  AlloBMT 3/13  . Rash of face 12/05/2012    Due to GVHD  . Hypomagnesemia 01/26/2013    Past Surgical History  Procedure Laterality Date  . Tonsillectomy and adenoidectomy    . Bone marrow transplant    . Flexible sigmoidoscopy  11/17/2012    Procedure: FLEXIBLE SIGMOIDOSCOPY;  Surgeon: Petra Kuba, MD;  Location: [facility];  Service: Endoscopy;  Laterality: N/A;  Dr Ewing Schlein states will be admitted to [facility] 1339 11/16/12  . Esophagogastroduodenoscopy  11/17/2012    Procedure:  ESOPHAGOGASTRODUODENOSCOPY (EGD);  Surgeon: Petra Kuba, MD;  Location: [facility];  Service: Endoscopy;  Laterality: N/A;    History   Social History  . Marital Status: Married    Spouse Name: N/A    Number of Children: 1  . Years of Education: N/A   Occupational History  . PROFESSOR [employer]   Social History Main Topics  . Smoking status: Never Smoker   . Smokeless tobacco: Never Used  . Alcohol Use: No  . Drug Use: No  . Sexually Active: No   Other Topics Concern  . 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 Patient currently to his part-time at [employer]. The patient is married to Timothy [name], and has been married for 40 years. 1 daughter Timothy [name] who lives in [place].    Current Outpatient Prescriptions on File Prior to Visit  Medication Sig Dispense Refill  . acyclovir (ZOVIRAX) 400 MG tablet Take 2 tablets (800 mg total) by mouth 2 (two) times daily.  60 tablet  12  . B-D UF III MINI PEN NEEDLES 31G X 5 MM MISC       . Blood Glucose Monitoring Suppl (ONE TOUCH ULTRA 2) W/DEVICE KIT       . budesonide (ENTOCORT EC) 3 MG 24 hr capsule Take 3  mg by mouth 3 (three) times daily.       . Calcium Citrate (CITRACAL PO) Take 600 mg by mouth 4 (four) times daily - after meals and at bedtime.       . cholecalciferol (VITAMIN D) 1000 UNITS tablet Take 1,000 Units by mouth every evening.       . cholestyramine (QUESTRAN) 4 G packet Take 1 packet by mouth 2 (two) times daily with a meal.  60 each  1  . diltiazem (CARDIZEM CD) 240 MG 24 hr capsule Take 1 capsule (240 mg total) by mouth daily.  30 capsule  3  . fluconazole (DIFLUCAN) 100 MG tablet Take 1 tablet (100 mg total) by mouth every morning.  30 tablet  12  . furosemide (LASIX) 20 MG tablet Take 1 tablet (20 mg total) by mouth every morning.  30 tablet  2  . glucose blood (ONE TOUCH ULTRA TEST) test strip As directed  150 each  6  . Heparin Lock Flush  (HEPARIN FLUSH, PORCINE,) 100 UNIT/ML injection 300 Units by Intracatheter route as needed (to flush Hickman port).       . labetalol (NORMODYNE) 200 MG tablet Take 1 tablet (200 mg total) by mouth 2 (two) times daily.  60 tablet  12  . Lidocaine-Hydrocortisone Ace 3-0.5 % KIT Apply 1 application topically as needed (for pain).       . Multiple Vitamins-Minerals (CENTRUM SILVER ADULT 50+) TABS Take 1 tablet by mouth every evening.       Marland Kitchen omeprazole (PRILOSEC) 20 MG capsule Take 1 capsule (20 mg total) by mouth daily.  90 capsule  4  . ONETOUCH DELICA LANCETS 33G MISC 33 g by Does not apply route as directed.  200 each  6  . oxyCODONE (OXYCONTIN) 10 MG 12 hr tablet Take 1 tablet (10 mg total) by mouth every 12 (twelve) hours.  60 tablet  0  . Sodium Chloride Flush (NORMAL SALINE FLUSH) 0.9 % SOLN 10 mLs by Intracatheter route as needed (to flush Hickman).       Marland Kitchen sulfamethoxazole-trimethoprim (BACTRIM DS) 800-160 MG per tablet Take 1 tablet by mouth daily.  30 tablet  11  . tacrolimus (PROGRAF) 0.5 MG capsule Take 1.5 mg by mouth 2 (two) times daily.       Current Facility-Administered Medications on File Prior to Visit  Medication Dose Route Frequency Provider Last Rate Last Dose  . 0.9 %  sodium chloride infusion   Intravenous Continuous Amy G Berry, PA-C 500 mL/hr at 03/12/13 0900    . sodium chloride 0.9 % injection 10 mL  10 mL Intravenous PRN Lowella Dell, MD   10 mL at 08/11/12 1606    Allergies  Allergen Reactions  . Benadryl (Diphenhydramine Hcl)     "Restless leg syndrome"    Family History  Problem Relation Age of Onset  . Cancer Father     BP 126/70  Pulse 78  Ht 5\' 7"  (1.702 m)  Wt 181 lb (82.101 kg)  BMI 28.34 kg/m2  SpO2 97%    Review of Systems denies hypoglycemia and weight change    Objective:   Physical Exam VITAL SIGNS:  See vs page GENERAL: no distress  Lab Results  Component Value Date   HGBA1C 7.6* 03/21/2013      Assessment & Plan:  DM:  slightly overcontrolled.  This insulin regimen was chosen from multiple options, for its simplicity.  The benefits of glycemic control must be weighed against the  risks of hypoglycemia.   GVH, on prednisone taper.  The tapering will decrease insulin requirement Edema, uncertain etiology

## 2013-06-13 ENCOUNTER — Ambulatory Visit: Payer: Self-pay

## 2013-06-13 ENCOUNTER — Other Ambulatory Visit: Payer: Self-pay | Admitting: Lab

## 2013-06-14 ENCOUNTER — Encounter (HOSPITAL_COMMUNITY): Payer: Self-pay

## 2013-06-14 ENCOUNTER — Other Ambulatory Visit (HOSPITAL_COMMUNITY): Payer: Self-pay | Admitting: Interventional Radiology

## 2013-06-14 ENCOUNTER — Ambulatory Visit (HOSPITAL_COMMUNITY)
Admission: RE | Admit: 2013-06-14 | Discharge: 2013-06-14 | Disposition: A | Discharge status: Home / Self Care | Payer: BC Managed Care – PPO | Source: Ambulatory Visit | Attending: Interventional Radiology | Admitting: Interventional Radiology

## 2013-06-14 ENCOUNTER — Other Ambulatory Visit: Payer: Self-pay | Admitting: *Deleted

## 2013-06-14 DIAGNOSIS — N189 Chronic kidney disease, unspecified: Secondary | ICD-10-CM | POA: Insufficient documentation

## 2013-06-14 DIAGNOSIS — I129 Hypertensive chronic kidney disease with stage 1 through stage 4 chronic kidney disease, or unspecified chronic kidney disease: Secondary | ICD-10-CM | POA: Insufficient documentation

## 2013-06-14 DIAGNOSIS — C9110 Chronic lymphocytic leukemia of B-cell type not having achieved remission: Secondary | ICD-10-CM | POA: Insufficient documentation

## 2013-06-14 DIAGNOSIS — Z01812 Encounter for preprocedural laboratory examination: Secondary | ICD-10-CM | POA: Insufficient documentation

## 2013-06-14 DIAGNOSIS — I4510 Unspecified right bundle-branch block: Secondary | ICD-10-CM | POA: Insufficient documentation

## 2013-06-14 DIAGNOSIS — X58XXXA Exposure to other specified factors, initial encounter: Secondary | ICD-10-CM | POA: Insufficient documentation

## 2013-06-14 DIAGNOSIS — M549 Dorsalgia, unspecified: Secondary | ICD-10-CM

## 2013-06-14 DIAGNOSIS — IMO0002 Reserved for concepts with insufficient information to code with codable children: Secondary | ICD-10-CM

## 2013-06-14 DIAGNOSIS — S22009A Unspecified fracture of unspecified thoracic vertebra, initial encounter for closed fracture: Secondary | ICD-10-CM | POA: Insufficient documentation

## 2013-06-14 DIAGNOSIS — S32009A Unspecified fracture of unspecified lumbar vertebra, initial encounter for closed fracture: Secondary | ICD-10-CM | POA: Insufficient documentation

## 2013-06-14 DIAGNOSIS — E785 Hyperlipidemia, unspecified: Secondary | ICD-10-CM | POA: Insufficient documentation

## 2013-06-14 LAB — CBC
MCH: 32.6 pg (ref 26.0–34.0)
MCHC: 33.8 g/dL (ref 30.0–36.0)
MCV: 96.4 fL (ref 78.0–100.0)
Platelets: 83 10*3/uL — ABNORMAL LOW (ref 150–400)
RDW: 14.7 % (ref 11.5–15.5)

## 2013-06-14 LAB — BASIC METABOLIC PANEL
BUN: 38 mg/dL — ABNORMAL HIGH (ref 6–23)
CO2: 24 mEq/L (ref 19–32)
Calcium: 9.1 mg/dL (ref 8.4–10.5)
Creatinine, Ser: 0.97 mg/dL (ref 0.50–1.35)
GFR calc non Af Amer: 84 mL/min — ABNORMAL LOW (ref >90)
Glucose, Bld: 113 mg/dL — ABNORMAL HIGH (ref 70–99)
Sodium: 131 mEq/L — ABNORMAL LOW (ref 135–145)

## 2013-06-14 LAB — PROTIME-INR: Prothrombin Time: 12.5 seconds (ref 11.6–15.2)

## 2013-06-14 MED ORDER — IOHEXOL 300 MG/ML  SOLN
50.0000 mL | Freq: Once | INTRAMUSCULAR | Status: AC | PRN
  Administered 2013-06-14: 1 mL via INTRAVENOUS

## 2013-06-14 MED ORDER — CEFAZOLIN SODIUM-DEXTROSE 2-3 GM-% IV SOLR: 2.0000 g | Freq: Once | INTRAVENOUS | Status: AC

## 2013-06-14 MED ORDER — SODIUM CHLORIDE 0.9 % IV SOLN: Freq: Once | INTRAVENOUS | Status: DC

## 2013-06-14 MED ORDER — CEFAZOLIN SODIUM-DEXTROSE 2-3 GM-% IV SOLR
INTRAVENOUS | Status: AC
  Administered 2013-06-14: 2 g via INTRAVENOUS
  Filled 2013-06-14: qty 50

## 2013-06-14 MED ORDER — MIDAZOLAM HCL 2 MG/2ML IJ SOLN
INTRAMUSCULAR | Status: AC
  Filled 2013-06-14: qty 6

## 2013-06-14 MED ORDER — SODIUM CHLORIDE 0.9 % IV SOLN: INTRAVENOUS | Status: AC

## 2013-06-14 MED ORDER — FENTANYL CITRATE 0.05 MG/ML IJ SOLN
INTRAMUSCULAR | Status: DC | PRN
  Administered 2013-06-14 (×2): 25 ug via INTRAVENOUS

## 2013-06-14 MED ORDER — MIDAZOLAM HCL 2 MG/2ML IJ SOLN
INTRAMUSCULAR | Status: DC | PRN
  Administered 2013-06-14 (×2): 1 mg via INTRAVENOUS

## 2013-06-14 MED ORDER — HYDROMORPHONE HCL PF 1 MG/ML IJ SOLN
INTRAMUSCULAR | Status: AC
  Filled 2013-06-14: qty 3

## 2013-06-14 MED ORDER — TOBRAMYCIN SULFATE 1.2 G IJ SOLR
INTRAMUSCULAR | Status: AC
  Filled 2013-06-14: qty 1.2

## 2013-06-14 MED ORDER — FENTANYL CITRATE 0.05 MG/ML IJ SOLN
INTRAMUSCULAR | Status: AC
  Filled 2013-06-14: qty 4

## 2013-06-14 NOTE — Procedures (Signed)
S/P L3 and L4 KP with biopsy.

## 2013-06-14 NOTE — H&P (Signed)
Timothy [name] is an 67 y.o. male.   Chief Complaint: Low back pain x 1-2 months Hx Chronic lymphocytic Leukemia Falls at home - many fxs: T12; L2; L3; L4; L5 Pain mostly low back Scheduled for Lumbar 3/4/5 Kyphoplasty/vertebroplasty HPI: CLL; Divertic; HLD; HTN; CMV; R BBB; A fib; CKD  Past Medical History  Diagnosis Date  . 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 Diverticular disease   . Hyperlipidemia   . Obesity   . Hypertension   . Hiatal hernia   . CMV (cytomegalovirus) antibody positive     pre-transplant, with seroconversion x2 pst-transplant  . Right bundle branch block     pre-transplant  . CKD (chronic kidney disease) stage 2, GFR 60-89 ml/min   . Pancytopenia   . Steroid-induced diabetes   . Atrial fibrillation     post-transplant  . Myopathy   . 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 Leukemia, chronic lymphoid   . 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 Diarrhea in adult patient 12/05/2012    Due to active GVHD  . CLL (chronic lymphocytic leukemia) 12/05/2012    Dx 07/1999; started Rx 12/06  AlloBMT 3/13  . Rash of face 12/05/2012    Due to GVHD  . Hypomagnesemia 01/26/2013    Past Surgical History  Procedure Laterality Date  . Tonsillectomy and adenoidectomy    . Bone marrow transplant    . Flexible sigmoidoscopy  11/17/2012    Procedure: FLEXIBLE SIGMOIDOSCOPY;  Surgeon: Petra Kuba, MD;  Location: [facility];  Service: Endoscopy;  Laterality: N/A;  Dr Ewing Schlein states will be admitted to [facility] 1339 11/16/12  . Esophagogastroduodenoscopy  11/17/2012    Procedure: ESOPHAGOGASTRODUODENOSCOPY (EGD);  Surgeon: Petra Kuba, MD;  Location: [facility];  Service: Endoscopy;  Laterality: N/A;    Family History  Problem Relation Age of Onset  . Cancer Father    Social History:  reports that he has never smoked. He has never used smokeless tobacco. He  reports that he does not drink alcohol or use illicit drugs.  Allergies:  Allergies  Allergen Reactions  . Benadryl (Diphenhydramine Hcl)     "Restless leg syndrome"     (Not in a hospital admission)  No results found for this or any previous visit (from the past 48 hour(s)). No results found.  Review of Systems  Constitutional: Negative for fever.  Respiratory: Negative for cough.   Cardiovascular: Negative for chest pain.  Gastrointestinal: Negative for nausea, vomiting and abdominal pain.  Neurological: Positive for weakness.    There were no vitals taken for this visit. Physical Exam  Constitutional: He is oriented to person, place, and time. He appears well-nourished.  Cardiovascular: Normal rate, regular rhythm and normal heart sounds.   No murmur heard. Respiratory: Effort normal and breath sounds normal. He has no wheezes.  GI: Soft. Bowel sounds are normal. There is no tenderness.  Musculoskeletal: Normal range of motion. He exhibits tenderness.  Uses walker Low back pain  Neurological: He is alert and oriented to person, place, and time. No cranial nerve deficit.  Skin: Skin is warm and dry.  Psychiatric: He has a normal mood and affect. His behavior is normal. Judgment and thought content normal.     Assessment/Plan LBP Fx T12; L2/3/4/5 Pain is low back Scheduled for L3/4/5 KP Pt and wife aware of procedure benefits and risks and agreeable to proceed Consent  signed and in chart  Vinnie Gombert A 06/14/2013, 7:58 AM

## 2013-06-15 ENCOUNTER — Other Ambulatory Visit (HOSPITAL_BASED_OUTPATIENT_CLINIC_OR_DEPARTMENT_OTHER): Payer: BC Managed Care – PPO

## 2013-06-15 ENCOUNTER — Ambulatory Visit (HOSPITAL_BASED_OUTPATIENT_CLINIC_OR_DEPARTMENT_OTHER): Payer: BC Managed Care – PPO

## 2013-06-15 ENCOUNTER — Other Ambulatory Visit: Payer: Self-pay | Admitting: Oncology

## 2013-06-15 ENCOUNTER — Other Ambulatory Visit: Payer: Self-pay | Admitting: Lab

## 2013-06-15 DIAGNOSIS — E099 Drug or chemical induced diabetes mellitus without complications: Secondary | ICD-10-CM

## 2013-06-15 DIAGNOSIS — E860 Dehydration: Secondary | ICD-10-CM

## 2013-06-15 DIAGNOSIS — C9110 Chronic lymphocytic leukemia of B-cell type not having achieved remission: Secondary | ICD-10-CM

## 2013-06-15 LAB — CBC WITH DIFFERENTIAL/PLATELET
Eosinophils Absolute: 0 10*3/uL (ref 0.0–0.5)
MONO#: 0.5 10*3/uL (ref 0.1–0.9)
NEUT#: 4.8 10*3/uL (ref 1.5–6.5)
RBC: 3.53 10*6/uL — ABNORMAL LOW (ref 4.20–5.82)
RDW: 15 % — ABNORMAL HIGH (ref 11.0–14.6)
WBC: 6.8 10*3/uL (ref 4.0–10.3)
lymph#: 1.5 10*3/uL (ref 0.9–3.3)

## 2013-06-15 LAB — COMPREHENSIVE METABOLIC PANEL (CC13)
ALT: 23 U/L (ref 0–55)
Albumin: 3.2 g/dL — ABNORMAL LOW (ref 3.5–5.0)
CO2: 25 mEq/L (ref 22–29)
Glucose: 122 mg/dl — ABNORMAL HIGH (ref 70–99)
Potassium: 4.9 mEq/L (ref 3.5–5.1)
Sodium: 134 mEq/L — ABNORMAL LOW (ref 136–145)
Total Protein: 5.5 g/dL — ABNORMAL LOW (ref 6.4–8.3)

## 2013-06-15 LAB — IGG, IGA, IGM
IgA: <7 mg/dL — ABNORMAL LOW (ref 68–379)
IgG (Immunoglobin G), Serum: 480 mg/dL — ABNORMAL LOW (ref 650–1600)
IgM, Serum: <4 mg/dL — ABNORMAL LOW (ref 41–251)

## 2013-06-15 MED ORDER — SODIUM CHLORIDE 0.9 % IJ SOLN
10.0000 mL | INTRAMUSCULAR | Status: DC | PRN
  Administered 2013-06-15: 10 mL via INTRAVENOUS
  Filled 2013-06-15: qty 10

## 2013-06-15 MED ORDER — HEPARIN SOD (PORK) LOCK FLUSH 100 UNIT/ML IV SOLN
500.0000 [IU] | Freq: Once | INTRAVENOUS | Status: AC
  Administered 2013-06-15: 250 [IU] via INTRAVENOUS
  Filled 2013-06-15: qty 5

## 2013-06-15 MED ORDER — SODIUM CHLORIDE 0.9 % IV SOLN
Freq: Once | INTRAVENOUS | Status: AC
  Administered 2013-06-15: 11:00:00 via INTRAVENOUS
  Filled 2013-06-15: qty 500

## 2013-06-15 NOTE — Patient Instructions (Addendum)
Hypomagnesemia Magnesium is a common ion (mineral) in the body which is needed for metabolism. It is about how the body handles food and other chemical reactions necessary for life. Only about 2% of the magnesium in our body is found in the blood. When this is low, it is called hypomagnesemia. The blood will measure only a tiny amount of the magnesium in our body. When it is low in our blood, it does not mean that the whole body supply is low. The normal serum concentration ranges from 1.8-2.5 mEq/L. When the level gets to be less than 1.0 mEq/L, a number of problems begin to happen.  CAUSES   Receiving intravenous fluids without magnesium replacement.  Loss of magnesium from the bowel by naso-gastric suction.  Loss of magnesium from nausea and vomiting or severe diarrhea. Any of the inflammatory bowel conditions can cause this.  Abuse of alcohol often leads to low serum magnesium.  An inherited form of magnesium loss happens when the kidneys lose magnesium. This is called familial or primary hypomagnesemia.  Some medications such as diuretics also cause the loss of magnesium. SYMPTOMS  These following problems are worse if the changes in magnesium levels come on suddenly.  Tremor.  Confusion.  Muscle weakness.  Over-sensitive to sights and sounds.  Sensitive reflexes.  Depression.  Muscular fibrillations.  Over-reactivity of the nerves.  Irritability.  Psychosis.  Spasms of the hand muscles.  Tetany (where the muscles go into uncontrollable spasms). DIAGNOSIS  This condition can be diagnosed by blood tests. TREATMENT   In emergency, magnesium can be given intravenously (by vein).  If the condition is less worrisome, it can be corrected by diet. High levels of magnesium are found in green leafy vegetables, peas, beans and nuts among other things. It can also be given through medications by mouth.  If it is being caused by medications, changes can be made.  If  alcohol is a problem, help is available if there are difficulties giving it up. Document Released: 09/03/2005 Document Revised: 03/01/2012 Document Reviewed: 07/28/2008 ExitCare Patient Information 2014 [employer].        

## 2013-06-16 ENCOUNTER — Other Ambulatory Visit: Payer: Self-pay | Admitting: Lab

## 2013-06-16 ENCOUNTER — Ambulatory Visit: Payer: Self-pay

## 2013-06-20 ENCOUNTER — Other Ambulatory Visit: Payer: Self-pay | Admitting: Lab

## 2013-06-20 ENCOUNTER — Ambulatory Visit: Payer: Self-pay

## 2013-06-22 ENCOUNTER — Other Ambulatory Visit: Payer: Self-pay | Admitting: *Deleted

## 2013-06-22 ENCOUNTER — Telehealth: Payer: Self-pay | Admitting: *Deleted

## 2013-06-22 ENCOUNTER — Other Ambulatory Visit: Payer: Self-pay | Admitting: Oncology

## 2013-06-22 DIAGNOSIS — D89811 Chronic graft-versus-host disease: Secondary | ICD-10-CM

## 2013-06-22 DIAGNOSIS — Z9489 Other transplanted organ and tissue status: Secondary | ICD-10-CM

## 2013-06-22 DIAGNOSIS — C9110 Chronic lymphocytic leukemia of B-cell type not having achieved remission: Secondary | ICD-10-CM

## 2013-06-22 MED ORDER — INSULIN ASPART PROT & ASPART (70-30 MIX) 100 UNIT/ML ~~LOC~~ SUSP: 18.0000 [IU] | SUBCUTANEOUS | Status: DC

## 2013-06-22 MED ORDER — INSULIN ASPART 100 UNIT/ML FLEXPEN: PEN_INJECTOR | SUBCUTANEOUS | Status: DC

## 2013-06-22 NOTE — Telephone Encounter (Signed)
Patient requested refill for novulog 70/30 flex pen.  Refill authorized as flex pen at this time.

## 2013-06-23 ENCOUNTER — Ambulatory Visit (HOSPITAL_BASED_OUTPATIENT_CLINIC_OR_DEPARTMENT_OTHER): Payer: BC Managed Care – PPO | Admitting: Physician Assistant

## 2013-06-23 ENCOUNTER — Ambulatory Visit (HOSPITAL_BASED_OUTPATIENT_CLINIC_OR_DEPARTMENT_OTHER): Payer: BC Managed Care – PPO

## 2013-06-23 ENCOUNTER — Telehealth: Payer: Self-pay | Admitting: *Deleted

## 2013-06-23 ENCOUNTER — Encounter: Payer: Self-pay | Admitting: Physician Assistant

## 2013-06-23 ENCOUNTER — Other Ambulatory Visit (HOSPITAL_BASED_OUTPATIENT_CLINIC_OR_DEPARTMENT_OTHER): Payer: BC Managed Care – PPO | Admitting: Lab

## 2013-06-23 ENCOUNTER — Telehealth: Payer: Self-pay | Admitting: Oncology

## 2013-06-23 ENCOUNTER — Other Ambulatory Visit: Payer: Self-pay | Admitting: Oncology

## 2013-06-23 VITALS — BP 138/74 | HR 68 | Temp 98.6°F | Resp 20 | Ht 67.0 in | Wt 174.2 lb

## 2013-06-23 DIAGNOSIS — E099 Drug or chemical induced diabetes mellitus without complications: Secondary | ICD-10-CM

## 2013-06-23 DIAGNOSIS — C9110 Chronic lymphocytic leukemia of B-cell type not having achieved remission: Secondary | ICD-10-CM

## 2013-06-23 DIAGNOSIS — R259 Unspecified abnormal involuntary movements: Secondary | ICD-10-CM

## 2013-06-23 DIAGNOSIS — I10 Essential (primary) hypertension: Secondary | ICD-10-CM

## 2013-06-23 DIAGNOSIS — Z9489 Other transplanted organ and tissue status: Secondary | ICD-10-CM

## 2013-06-23 DIAGNOSIS — R768 Other specified abnormal immunological findings in serum: Secondary | ICD-10-CM

## 2013-06-23 DIAGNOSIS — A0471 Enterocolitis due to Clostridium difficile, recurrent: Secondary | ICD-10-CM

## 2013-06-23 DIAGNOSIS — D809 Immunodeficiency with predominantly antibody defects, unspecified: Secondary | ICD-10-CM

## 2013-06-23 DIAGNOSIS — R894 Abnormal immunological findings in specimens from other organs, systems and tissues: Secondary | ICD-10-CM

## 2013-06-23 DIAGNOSIS — G252 Other specified forms of tremor: Secondary | ICD-10-CM

## 2013-06-23 DIAGNOSIS — T380X5A Adverse effect of glucocorticoids and synthetic analogues, initial encounter: Secondary | ICD-10-CM

## 2013-06-23 DIAGNOSIS — D849 Immunodeficiency, unspecified: Secondary | ICD-10-CM

## 2013-06-23 DIAGNOSIS — E139 Other specified diabetes mellitus without complications: Secondary | ICD-10-CM

## 2013-06-23 DIAGNOSIS — D89811 Chronic graft-versus-host disease: Secondary | ICD-10-CM

## 2013-06-23 DIAGNOSIS — A0472 Enterocolitis due to Clostridium difficile, not specified as recurrent: Secondary | ICD-10-CM

## 2013-06-23 DIAGNOSIS — E860 Dehydration: Secondary | ICD-10-CM

## 2013-06-23 LAB — CBC WITH DIFFERENTIAL/PLATELET
BASO%: 0.2 % (ref 0.0–2.0)
Basophils Absolute: 0 10*3/uL (ref 0.0–0.1)
HCT: 35.8 % — ABNORMAL LOW (ref 38.4–49.9)
HGB: 11.9 g/dL — ABNORMAL LOW (ref 13.0–17.1)
LYMPH%: 33.4 % (ref 14.0–49.0)
MCH: 32 pg (ref 27.2–33.4)
MCHC: 33.2 g/dL (ref 32.0–36.0)
MONO#: 0.6 10*3/uL (ref 0.1–0.9)
NEUT%: 55.1 % (ref 39.0–75.0)
Platelets: 117 10*3/uL — ABNORMAL LOW (ref 140–400)
WBC: 5.5 10*3/uL (ref 4.0–10.3)

## 2013-06-23 LAB — COMPREHENSIVE METABOLIC PANEL (CC13)
ALT: 28 U/L (ref 0–55)
BUN: 36.1 mg/dL — ABNORMAL HIGH (ref 7.0–26.0)
CO2: 26 mEq/L (ref 22–29)
Calcium: 9.3 mg/dL (ref 8.4–10.4)
Chloride: 103 mEq/L (ref 98–109)
Creatinine: 0.8 mg/dL (ref 0.7–1.3)
Total Bilirubin: 0.28 mg/dL (ref 0.20–1.20)

## 2013-06-23 MED ORDER — SODIUM CHLORIDE 0.9 % IV SOLN
Freq: Once | INTRAVENOUS | Status: AC
  Administered 2013-06-23: 13:00:00 via INTRAVENOUS
  Filled 2013-06-23: qty 500

## 2013-06-23 MED ORDER — VANCOMYCIN 50 MG/ML ORAL SOLUTION: 125.0000 mg | Freq: Four times a day (QID) | ORAL | Status: DC

## 2013-06-23 NOTE — Patient Instructions (Signed)
Hypomagnesemia Magnesium is a common ion (mineral) in the body which is needed for metabolism. It is about how the body handles food and other chemical reactions necessary for life. Only about 2% of the magnesium in our body is found in the blood. When this is low, it is called hypomagnesemia. The blood will measure only a tiny amount of the magnesium in our body. When it is low in our blood, it does not mean that the whole body supply is low. The normal serum concentration ranges from 1.8-2.5 mEq/L. When the level gets to be less than 1.0 mEq/L, a number of problems begin to happen.  CAUSES   Receiving intravenous fluids without magnesium replacement.  Loss of magnesium from the bowel by naso-gastric suction.  Loss of magnesium from nausea and vomiting or severe diarrhea. Any of the inflammatory bowel conditions can cause this.  Abuse of alcohol often leads to low serum magnesium.  An inherited form of magnesium loss happens when the kidneys lose magnesium. This is called familial or primary hypomagnesemia.  Some medications such as diuretics also cause the loss of magnesium. SYMPTOMS  These following problems are worse if the changes in magnesium levels come on suddenly.  Tremor.  Confusion.  Muscle weakness.  Over-sensitive to sights and sounds.  Sensitive reflexes.  Depression.  Muscular fibrillations.  Over-reactivity of the nerves.  Irritability.  Psychosis.  Spasms of the hand muscles.  Tetany (where the muscles go into uncontrollable spasms). DIAGNOSIS  This condition can be diagnosed by blood tests. TREATMENT   In emergency, magnesium can be given intravenously (by vein).  If the condition is less worrisome, it can be corrected by diet. High levels of magnesium are found in green leafy vegetables, peas, beans and nuts among other things. It can also be given through medications by mouth.  If it is being caused by medications, changes can be made.  If  alcohol is a problem, help is available if there are difficulties giving it up. Document Released: 09/03/2005 Document Revised: 03/01/2012 Document Reviewed: 07/28/2008 [employer] Patient Information 2014 [employer].        

## 2013-06-23 NOTE — Telephone Encounter (Signed)
, °

## 2013-06-23 NOTE — Progress Notes (Signed)
ID: Timothy [name]   DOB: Sep 16, 1946  Timothy#: [id number]  WUJ#:[id number]  NFA:OZHY,Q DOUGLAS, MD SU: OTHER MD: Donzetta Starch, Romero Belling  HISTORY OF PRESENT ILLNESS: We have very complete records from Dr. Sydnee Levans in [place], and in summary:  The patient was initially diagnosed in August 2000, with a white cell count of 23,600, but normal hemoglobin and platelets, and no significant symptomatology. Over the next several years his white cell count drifted up, and he eventually developed some symptoms of night sweats in particular, leading to treatment with fludarabine, Cytoxan and rituxan for five cycles given between December 2006 and May 2007.  We have CT scans from June 2006, November 2006 and April 2007, and comparing the November 2006 and April 2007 scans, there was near complete response. He had subsequent therapy in [place] as detailed below, but with decreased response, leading to allogeneic stem-cell transplant at the [facility] 02/24/2012.  INTERVAL HISTORY: Timothy [name] returns with his wife Timothy [name]  for followup of his CLL, status post allogeneic stem cell transplant in [place] in March 2013. Timothy [name] is feeling well and looks great!  Since his last appointment here, Timothy [name] underwent kyphoplasty on 06/14/2013. He has had good pain relief although it has not been complete. His pain was originally a 7 or 8 on a scale of 1-10, and this has decreased to a 4. He occasionally takes pain medication, but no more than 3 tablets daily, and this has been very sufficient.    REVIEW OF SYSTEMS: Timothy [name] has had no fevers or chills. He continues to bruise easily, but has had no bleeding. He is eating and drinking well, and has had no nausea. He's had no additional problems with diarrhea, and continues on vancomycin with good tolerance. He denies any cough, phlegm production, shortness of breath, chest pain, or palpitations. He continues to be tremulous, but this is stable. He has not been  doing physical therapy and feels a little weaker as a result, but he does continue to walk daily. Currently, he denies any pain, specifically no unusual myalgias, arthralgias, or bony pain. He continues to have chronic swelling in both lower extremities.  A detailed review of systems is otherwise stable and noncontributory.   PAST MEDICAL HISTORY: Past Medical History  Diagnosis Date  . 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 Diverticular disease   . Hyperlipidemia   . Obesity   . Hypertension   . Hiatal hernia   . CMV (cytomegalovirus) antibody positive     pre-transplant, with seroconversion x2 pst-transplant  . Right bundle branch block     pre-transplant  . CKD (chronic kidney disease) stage 2, GFR 60-89 ml/min   . Pancytopenia   . Steroid-induced diabetes   . Atrial fibrillation     post-transplant  . Myopathy   . 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 Leukemia, chronic lymphoid   . 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 Diarrhea in adult patient 12/05/2012    Due to active GVHD  . CLL (chronic lymphocytic leukemia) 12/05/2012    Dx 07/1999; started Rx 12/06  AlloBMT 3/13  . Rash of face 12/05/2012    Due to GVHD  . Hypomagnesemia 01/26/2013    PAST SURGICAL HISTORY: Past Surgical History  Procedure Laterality Date  . Tonsillectomy and adenoidectomy    . Bone marrow transplant    . Flexible sigmoidoscopy  11/17/2012  Procedure: FLEXIBLE SIGMOIDOSCOPY;  Surgeon: Petra Kuba, MD;  Location: [facility];  Service: Endoscopy;  Laterality: N/A;  Dr Ewing Schlein states will be admitted to rooom 1339 11/16/12  . Esophagogastroduodenoscopy  11/17/2012    Procedure: ESOPHAGOGASTRODUODENOSCOPY (EGD);  Surgeon: Petra Kuba, MD;  Location: [facility];  Service: Endoscopy;  Laterality: N/A;    FAMILY HISTORY Family History  Problem Relation Age of Onset  . Cancer  Father    The patient's father died from complications of chronic lymphocytic leukemia at the age of 60.  It had been diagnosed seven years before when he was 48.  The patient is enrolled in a familial chronic lymphocytic leukemia study out of the [facility].  The patient's mother is 21, alive, unfortunately suffering with dementia, and he has a brother, 39, who is otherwise in fair health.   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  His wife of >40 years, Timothy [name], is a homemaker.  Their daughter, Timothy [name], lives in [place].  She also is a Futures trader.  The patient has an 51 year old grandson and an 35-year-old granddaughter, and that is really the main reason he moved to this area.  He is a International aid/development worker.     ADVANCED DIRECTIVES: In place  HEALTH MAINTENANCE: History  Substance Use Topics  . Smoking status: Never Smoker   . Smokeless tobacco: Never Used  . Alcohol Use: No     Colonoscopy:  PSA:  Bone density:  Lipid panel:  Allergies  Allergen Reactions  . Benadryl (Diphenhydramine Hcl)     "Restless leg syndrome"    Current Outpatient Prescriptions  Medication Sig Dispense Refill  . acyclovir (ZOVIRAX) 400 MG tablet Take 2 tablets (800 mg total) by mouth 2 (two) times daily.  60 tablet  12  . ANDRODERM 4 MG/24HR PT24 patch APPLY 1 PATCH TO SKIN, CHANGE PATCH DAILY. REMOVE OLD PATCH, FOLD & DISCARD AWAY FROM PETS & CHILDREN  30 patch  3  . B-D UF III MINI PEN NEEDLES 31G X 5 MM MISC       . Blood Glucose Monitoring Suppl (ONE TOUCH ULTRA 2) W/DEVICE KIT       . budesonide (ENTOCORT EC) 3 MG 24 hr capsule Take 3 mg by mouth 3 (three) times daily.       . Calcium Citrate (CITRACAL PO) Take 600 mg by mouth 4 (four) times daily - after meals and at bedtime.       . cholecalciferol (VITAMIN D) 1000 UNITS tablet Take 1,000 Units by mouth every evening.        . cholestyramine (QUESTRAN) 4 G packet Take 1 packet by mouth 2 (two) times daily with a meal.  60 each  1  . diltiazem (CARDIZEM CD) 240 MG 24 hr capsule Take 1 capsule (240 mg total) by mouth daily.  30 capsule  3  . DUREZOL 0.05 % EMUL       . ferrous sulfate 325 (65 FE) MG tablet Take 325 mg by mouth daily with breakfast.      . fluconazole (DIFLUCAN) 100 MG tablet Take 1 tablet (100 mg total) by mouth every morning.  30 tablet  12  . furosemide (LASIX) 20 MG tablet Take 1 tablet (20 mg total) by mouth every morning.  30 tablet  2  . glucose blood (ONE TOUCH ULTRA TEST) test strip As directed  150  each  6  . Heparin Lock Flush (HEPARIN FLUSH, PORCINE,) 100 UNIT/ML injection 300 Units by Intracatheter route as needed (to flush Hickman port).       Marland Kitchen HYDROcodone-acetaminophen (NORCO) 10-325 MG per tablet Take 1-2 tablets by mouth every 6 (six) hours as needed for pain (breakthrough pain).      Marland Kitchen labetalol (NORMODYNE) 200 MG tablet Take 1 tablet (200 mg total) by mouth 2 (two) times daily.  60 tablet  12  . Lidocaine-Hydrocortisone Ace 3-0.5 % KIT Apply 1 application topically as needed (for pain).       . moxifloxacin (VIGAMOX) 0.5 % ophthalmic solution Place 1 drop into the right eye 4 (four) times daily.      . Multiple Vitamins-Minerals (CENTRUM SILVER ADULT 50+) TABS Take 1 tablet by mouth every evening.       Marland Kitchen NOVOLOG MIX 70/30 FLEXPEN (70-30) 100 UNIT/ML SUPN INJECT 18 UNITS EVERY MORNING AND 9 UNITS WITH SUPPER  15 mL  1  . NOVOLOG MIX 70/30 FLEXPEN (70-30) 100 UNIT/ML SUPN INJECT 18 UNITS UNDER THE SKIN AND 9 UNITS IN THE EVENING AS DIRECTED  15 mL  1  . omeprazole (PRILOSEC) 20 MG capsule Take 1 capsule (20 mg total) by mouth daily.  90 capsule  4  . ONETOUCH DELICA LANCETS 33G MISC 33 g by Does not apply route as directed.  200 each  6  . oxyCODONE (OXYCONTIN) 10 MG 12 hr tablet Take 1 tablet (10 mg total) by mouth every 12 (twelve) hours.  60 tablet  0  . predniSONE (DELTASONE) 5  MG tablet Take 5-15 mg by mouth See admin instructions. 15mg  for 10 days; then 10mg  for 10 days, then 5mg  for 20 days      . PROTEIN PO Take 6 g by mouth 3 (three) times daily.      . sertraline (ZOLOFT) 50 MG tablet Take 50-100 mg by mouth daily. Alternates 50mg  and 100mg  daily      . Sodium Chloride Flush (NORMAL SALINE FLUSH) 0.9 % SOLN 10 mLs by Intracatheter route as needed (to flush Hickman).       Marland Kitchen sulfamethoxazole-trimethoprim (BACTRIM DS) 800-160 MG per tablet Take 1 tablet by mouth daily.  30 tablet  11  . tacrolimus (PROGRAF) 0.5 MG capsule Take 1.5 mg by mouth 2 (two) times daily.      . vancomycin (VANCOCIN) 50 mg/mL oral solution Take 2.5 mLs (125 mg total) by mouth every 6 (six) hours.  300 mL  1  . zinc sulfate 220 MG capsule Take 220 mg by mouth daily.      . [DISCONTINUED] insulin aspart (NOVOLOG FLEXPEN) 100 UNIT/ML SOPN FlexPen 18units sq qam, 9units sq qpm, or as directed  15 mL  1   No current facility-administered medications for this visit.   Facility-Administered Medications Ordered in Other Visits  Medication Dose Route Frequency Provider Last Rate Last Dose  . 0.9 %  sodium chloride infusion   Intravenous Continuous Gray Doering G Rodriques Badie, PA-C 500 mL/hr at 03/12/13 0900    . sodium chloride 0.9 % injection 10 mL  10 mL Intravenous PRN Lowella Dell, MD   10 mL at 08/11/12 1606    OBJECTIVE: Middle-aged white male in no acute distress. Filed Vitals:   06/23/13 1139  BP: 138/74  Pulse: 68  Temp: 98.6 F (37 C)  Resp: 20     Body mass index is 27.28 kg/(m^2).   Filed Weights   06/23/13 1139  Weight:  174 lb 3.2 oz (79.017 kg)   ECOG FS: 2  Face shows no rash, skin is smooth and soft Sclerae unicteric Oropharynx is benign Lungs clear to auscultation with good excursion, no rales or rhonchi, no wheezes Heart regular rate and rhythm Abdomen  soft, nontender, positive bowel sounds Bilateral lower extremity edema, 2+ and equal bilaterally  Neuro: nonfocal, well  oriented, positive affect Few ecchymoses noted on the hands and upper extremities, unchanged    LAB RESULTS:  CBC    Component Value Date/Time   WBC 5.5 06/23/2013 1108   WBC 5.6 06/14/2013 0800   RBC 3.72* 06/23/2013 1108   RBC 3.59* 06/14/2013 0800   HGB 11.9* 06/23/2013 1108   HGB 11.7* 06/14/2013 0800   HCT 35.8* 06/23/2013 1108   HCT 34.6* 06/14/2013 0800   PLT 117* 06/23/2013 1108   PLT 83* 06/14/2013 0800   MCV 96.2 06/23/2013 1108   MCV 96.4 06/14/2013 0800   MCH 32.0 06/23/2013 1108   MCH 32.6 06/14/2013 0800   MCHC 33.2 06/23/2013 1108   MCHC 33.8 06/14/2013 0800   RDW 14.5 06/23/2013 1108   RDW 14.7 06/14/2013 0800   LYMPHSABS 1.8 06/23/2013 1108   LYMPHSABS 1.4 03/18/2013 0615   MONOABS 0.6 06/23/2013 1108   MONOABS 0.3 03/18/2013 0615   EOSABS 0.0 06/23/2013 1108   EOSABS 0.0 03/18/2013 0615   BASOSABS 0.0 06/23/2013 1108   BASOSABS 0.0 03/18/2013 0615        Chemistry      Component Value Date/Time   NA 134* 06/23/2013 1109   NA 131* 06/14/2013 0800   K 4.4 06/23/2013 1109   K 4.7 06/14/2013 0800   CL 101 06/15/2013 1034   CL 97 06/14/2013 0800   CO2 26 06/23/2013 1109   CO2 24 06/14/2013 0800   BUN 36.1* 06/23/2013 1109   BUN 38* 06/14/2013 0800   CREATININE 0.8 06/23/2013 1109   CREATININE 0.97 06/14/2013 0800      Component Value Date/Time   CALCIUM 9.3 06/23/2013 1109   CALCIUM 9.1 06/14/2013 0800   ALKPHOS 85 06/23/2013 1109   ALKPHOS 75 04/18/2013 1218   AST 33 06/23/2013 1109   AST 24 04/18/2013 1218   ALT 28 06/23/2013 1109   ALT 33 04/18/2013 1218   BILITOT 0.28 06/23/2013 1109   BILITOT 0.2* 04/18/2013 1218     Magnesium  1.9  06/06/2013    1.8  05/26/2013    1.9  05/23/2013    2.2  05/06/2013    1.9  04/20/2013    1.8  04/18/2013    2.2  04/13/2013  IgG   480  06/15/2013    609  06/08/2013    703  06/09/2013    286  05/06/2013     99  04/18/2013    308  03/14/2013    124  02/24/2013   STUDIES:    Ir Radiologist Eval & Mgmt  06/06/2013   *RADIOLOGY REPORT*  Today's date:   06/01/2013  RADIOLOGIST EVALUATION AND MANAGEMENT/OUTPATIENT CONSULTATION LEVEL [id number]  Chief Complaint:  Low back pain.  History of Present Illness:  Timothy. [name] is a 67 year old white male who has been referred to the Neuro Interventional Radiology Service by Dr. Marikay Alar Magrinat for further evaluation and treatment of symptomatic thoraco- lumbar compression fractures.  The patient presents today with his wife for further discussion.  According to the patient he has been having low back pain for approximately one month's duration.  He states the pain  is primarily in the mid to lower back region and currently averages about a 6/10 on the pain scale.  He is taking hydrocodone and oxycodone with mild to moderate relief of the pain.  The patient states the pain is exacerbated with movement or prolonged sitting.  He says the pain is relieved somewhat when lying down or tilting forward from a seated position.  The patient states that he has fallen several times over the past 6 months but not enough to elicit a significant amount of back pain afterwards.  The patient denies any radiation of pain to the lower extremities.  He denies any associated paresthesias of the lower extremities.  He states he is having normal bowel movements  and able to urinate without difficulty.  He uses a walker to assist with ambulation.  The patient does have a history of osteoporosis as well as CLL and is status post allogeneic stem cell transplant in [place] in March of 2013. Secondary to reported history of low back pain the patient underwent MRI of the lumbar spine at [facility] on 05/09/2013.  This revealed compression fractures involving T12 ?L2, L3, L4 and L5.  There was mild superior endplate edema enhancement suggesting acute to subacute fractures with healing.  There was multilevel degenerative changes throughout the lumbar spine.  There was spinal stenosis most severe at L4-5 with a moderate stenosis of the canal.  Of note,   the patient is scheduled to undergo cataract removal on June 12th and again on June 19th.  Past Medical History:  CLL, chronic graft versus host disease, diverticular disease, cataracts, hyperlipidemia, obesity, hypertension, hiatal hernia, CMV antibody positive, right bundle branch block, chronic kidney disease stage II, pancytopenia, steroid induced diabetes, past history of atrial fibrillation, fine tremor, hypomagnesemia, thrombocytopenia.  Past Surgical History:  Tonsillectomy, adenoidectomy, allogeneic stem cell transplant [place], March 2013.  Family History:  The patient's father died from complications of CLL at age 17.  The patient's mother is 38 with a history of dementia.  The patient has a brother 51 in fair health.  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 Lives in [place]. 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  The patient is married.  They have one daughter, Timothy [name],  who lives in [place].  The patient denies alcohol or tobacco use.  Allergies:  Benadryl.  Medications:  Zovirax, Biotin, Entocort EC, Citracal and vitamin D, Questran, Cardizem CD, Diflucan, Lasix, Norco, NovoLog 70/30, Normodyne, multivitamin, mupirocin ointment, Starlix, Prilosec, OxyContin, Deltasone, Zoloft, Bactrim DS, Prograf, Androderm, vancomycin capsules, zinc.  Review of Systems:  The patient denies recent fevers, chills, headaches, chest pain, shortness of breath, coughing, abdominal pain, nausea, vomiting, diarrhea, unusual bleeding.  Positives include mid to low back pain, swelling of the lower extremities, some blurring of vision secondary to cataracts.  Physical Exam:  The patient is awake, alert and oriented x3.  Chest is clear to auscultation bilaterally.  Heart with regular rate and rhythm.  Abdomen soft, slightly protuberant, positive bowel sounds, nontender.  There is bilateral lower extremity edema 2-3+. Strength and  sensory function is intact in all four extremities. The patient has mild to moderate paravertebral tenderness in the mid to lower lumbar spine region.  IMPRESSION: 1. Given the patient's pain is mostly in the lower lumbar region, vertebral body augmentation as will be performed at L3,L4 and  at L5.  The procedure, risks, benefits, and alternatives were reviewed  with the patient the patient's wife  There are in agreement to proceed witht vertebral body augmentation  in the form of kyphoplasty, for pain relief.  They both leave with clear understanding and agreement with the above mentioned plan.   Original Report Authenticated By: Julieanne Cotton, M.D.    Timothy Lumbar Spine W Wo Contrast  05/10/2013   *RADIOLOGY REPORT*  Clinical Data: Low back pain.  Leukemia with bone marrow transplant  MRI LUMBAR SPINE WITHOUT AND WITH CONTRAST  Technique:  Multiplanar and multiecho pulse sequences of the lumbar spine were obtained without and with intravenous contrast.  Contrast: 15mL MULTIHANCE GADOBENATE DIMEGLUMINE 529 MG/ML IV SOLN  Comparison: Lumbar radiographs 12/14/2012  Findings: Moderate compression fracture of T12 which was present previously.  However there is mild edema and enhancement in the superior plate suggesting a superimposed mild acute component.  Moderate compression fracture of L3.  This was present previously but has progressed.  There is mild bone marrow edema and enhancement involving the superior endplate indicating a superimposed acute fracture.  Mild compression fracture of L4 with bone marrow edema and enhancement in the superior endplate indicating a recent fracture.  Mild to moderate fracture of the superior plate of L5 with mild enhancement of the superior plate indicating healing fracture.  These fractures appear to be benign without associated mass or bone marrow infiltration.  Conus medullaris is normal and terminates at L1-2.  T12-L1:  Disc degeneration with central disc and osteophyte  causing mild spinal stenosis  L1-2:  Mild disc and mild facet degeneration  L2-3:  Disc bulging and moderate facet hypertrophy causing mild to moderate spinal stenosis  L3-4:  Mild disc bulging.  Moderate facet hypertrophy with mild spinal stenosis  L4-5:  Moderate spinal stenosis secondary to spondylosis and facet hypertrophy.  There is foraminal narrowing bilaterally  L5 S1:  Mild spondylosis and facet degeneration.  IMPRESSION: Compression fractures involving T12, L2, L3, L4, and L5.  These show mild superior endplate edema enhancement suggesting acute to subacute fractures with healing.  Multilevel degenerative change throughout the lumbar spine.  The spinal stenosis is most severe at L4-5 where there is moderate stenosis of the canal.   Original Report Authenticated By: Janeece Riggers, M.D.      ASSESSMENT: 67 y.o. [place] man with a history of well-differentiated lymphocytic lymphoma/ chronic lymphoid leukemia initially diagnosed in 2000, not requiring intervention until 2006; with multiple chromosomal abnormalities.  His treatment history is as follows:  (1) fludarabine/cyclophosphamide/rituximab x5 completed May 2007.   (2) rituximab for 8 doses October 2010, with partial response   (3) Leustatin and ofatumumab weekly x8 July to September 2011 followed by maintenance ofatumumab maintenance ofatumumab every 2 months, with initial response but rising counts September 2012   (4) status-post unrelated donor stem-cell transplant [facility]/24/2012 [facility]  (a) conditioning regimen consisted of fludarabine + TBI at 200 cGy, followed by rituximab x27;  (b) CMV reactivation x3 (patient CMV positive, donor negative), s/p ganciclovir treatment; 3d reactivation August 2013, s/p gancyclovir, with negative PCR mid-September 2013; last gancyclovir dose 10/06/2012 (c) Chronic GVHD: involving gut and skin, treated with steroids, tacrolimus and MMF.  MMF was eventually d/c'd and tacrolimus currently at a  dose of 1.5mg  BID (d) atrial fibrillation: resolved on brief amiodarone regimen (e) steroid-induced myopathy: improving  (f) hypomagnesemia: improved after d/c gancyclovir (g) hypogammaglobulinemia: s/p IVIG most recently 01/07/2013. (h) history of elevated triglycerides (606 on 07/14/2012)  (i) adrenal insufficiency: on prednisone and budesonide (j) pancytopenia, improved  (  5) restaging studies September 2013  including CT scans, flow cytometry, and bone marrow biopsy, showed no evidence of residual chronic lymphoid leukemia.  (6) recurrent GVHD (skin rash, mouth changes, severe diarrhea and gastric/duodenal/colonic biopsies 11/17/2012 c/w GVHD grade 2) now only remaining sign is bothersome diarrhea; c diff negative x3; continuing current regimen   (7) severe malnutrition -- on VITAL supplement in addition to regular diet; on Marinol for anorexia  (8) testosterone deficiency--on patch   (9) deconditioning: ongoing REHAB   (10) mild dehydration: encouraged increased po fluids; receives IVF support w magnesium three times weekly  (11) steroid-induced osteoporosis with compression fractures: received pamidronate 12/18/2012   (12) nausea: improved on current meds  (13)  Positive c.diff, 03/08/2013, on Flagyl 500 mg TID x 20 days, then on oral vanco with Questran, showing improvement  (14) hyperkalemia, improving  (15)  Hypertension, on labetalol, cardizem, and furosemide  (16) steroid induced hyperglycemia, on sterlix and 70/30 insulin  PLAN: Cohl will continue with his current regimen. Specifically, he'll receive IV fluids with magnesium here once weekly, and we will see him for physical exam every 2 weeks. This has been scheduled for him through the end of July, and we will make changes as necessary.  He'll continue a slow taper of prednisone, and will continue on the 15 mg dose until he sees Dr. Darnelle Catalan on July 16. At that time, we will likely decrease his dose to 12.5 mg daily.  We will continue to taper very slowly until we get him off the prednisone altogether, but this will possibly take a few months. He will also continue to follow with Dr. Everardo All with regards to his diabetes, and we expect that this will continue to improve as we decrease his prednisone dose. We have recently refilled his NovoLog pen, but Dr. Everardo All is also in the process of making some changes to his diabetic medications to prevent hypoglycemia.  He'll continue on the vancomycin as previously discussed with Dr. Darnelle Catalan, and I will refill this for him today.   All this was reviewed in detail with Timothy [name] and Timothy [name] today, both of whom voice understanding and agreement with our plan. They will call with any changes prior to his next appointment here.    Zollie Scale    06/23/2013

## 2013-06-23 NOTE — Telephone Encounter (Signed)
Per staff phone call and POF I have schedueld appts.  JMW  

## 2013-06-27 ENCOUNTER — Other Ambulatory Visit: Payer: Self-pay | Admitting: Lab

## 2013-06-27 ENCOUNTER — Ambulatory Visit: Payer: Self-pay

## 2013-06-30 ENCOUNTER — Ambulatory Visit (HOSPITAL_BASED_OUTPATIENT_CLINIC_OR_DEPARTMENT_OTHER): Payer: BC Managed Care – PPO

## 2013-06-30 ENCOUNTER — Other Ambulatory Visit (HOSPITAL_BASED_OUTPATIENT_CLINIC_OR_DEPARTMENT_OTHER): Payer: BC Managed Care – PPO | Admitting: Lab

## 2013-06-30 DIAGNOSIS — C9110 Chronic lymphocytic leukemia of B-cell type not having achieved remission: Secondary | ICD-10-CM

## 2013-06-30 DIAGNOSIS — D809 Immunodeficiency with predominantly antibody defects, unspecified: Secondary | ICD-10-CM

## 2013-06-30 DIAGNOSIS — D849 Immunodeficiency, unspecified: Secondary | ICD-10-CM

## 2013-06-30 LAB — CBC WITH DIFFERENTIAL/PLATELET
BASO%: 0.3 % (ref 0.0–2.0)
Basophils Absolute: 0 10*3/uL (ref 0.0–0.1)
Eosinophils Absolute: 0 10*3/uL (ref 0.0–0.5)
HCT: 34.6 % — ABNORMAL LOW (ref 38.4–49.9)
HGB: 11.7 g/dL — ABNORMAL LOW (ref 13.0–17.1)
LYMPH%: 20.5 % (ref 14.0–49.0)
MCHC: 33.8 g/dL (ref 32.0–36.0)
MONO#: 0.4 10*3/uL (ref 0.1–0.9)
NEUT#: 5.3 10*3/uL (ref 1.5–6.5)
NEUT%: 73.7 % (ref 39.0–75.0)
Platelets: 125 10*3/uL — ABNORMAL LOW (ref 140–400)
WBC: 7.2 10*3/uL (ref 4.0–10.3)
lymph#: 1.5 10*3/uL (ref 0.9–3.3)

## 2013-06-30 LAB — COMPREHENSIVE METABOLIC PANEL (CC13)
ALT: 26 U/L (ref 0–55)
AST: 28 U/L (ref 5–34)
CO2: 22 mEq/L (ref 22–29)
Calcium: 8.7 mg/dL (ref 8.4–10.4)
Chloride: 107 mEq/L (ref 98–109)
Creatinine: 0.8 mg/dL (ref 0.7–1.3)
Sodium: 137 mEq/L (ref 136–145)
Total Protein: 5.3 g/dL — ABNORMAL LOW (ref 6.4–8.3)

## 2013-06-30 LAB — IGG, IGA, IGM
IgA: <7 mg/dL — ABNORMAL LOW (ref 68–379)
IgM, Serum: 5 mg/dL — ABNORMAL LOW (ref 41–251)

## 2013-06-30 LAB — MAGNESIUM (CC13): Magnesium: 1.6 mg/dl (ref 1.5–2.5)

## 2013-06-30 MED ORDER — SODIUM CHLORIDE 0.9 % IJ SOLN
10.0000 mL | INTRAMUSCULAR | Status: AC
  Administered 2013-06-30: 10 mL via INTRAVENOUS
  Filled 2013-06-30: qty 10

## 2013-06-30 MED ORDER — HEPARIN SOD (PORK) LOCK FLUSH 100 UNIT/ML IV SOLN
250.0000 [IU] | Freq: Once | INTRAVENOUS | Status: AC
  Administered 2013-06-30: 250 [IU] via INTRAVENOUS
  Filled 2013-06-30: qty 5

## 2013-06-30 MED ORDER — SODIUM CHLORIDE 0.9 % IV SOLN
Freq: Once | INTRAVENOUS | Status: AC
  Administered 2013-06-30: 14:00:00 via INTRAVENOUS
  Filled 2013-06-30: qty 500

## 2013-06-30 NOTE — Patient Instructions (Signed)
Magnesium This is a blood test which measures the amount of magnesium in your blood. Most of the magnesium in your body exists in your cells. However, the magnesium in your blood is important for many processes. It is important for your nerves to be able to conduct electrical energy. This is important in heart patients with higher heartbeats. When your heart beats and then gets ready to beat again, it repolarizes. If the magnesium levels are low or high, it can affect the accuracy of the repolarization process. The magnesium levels are also monitored during pregnancy. This is done to determine if the expectant mother may have preeclampsia or toxemia. Magnesium is often used for treatment of these problems. PREPARATION FOR TEST No preparation or fasting is needed. A blood sample may be taken by inserting a needle into a vein in the arm.  NORMAL FINDINGS  Adult: 1.3 to 2.1 mEq/L or 0.65 to 1.05 mmol/L (SI units)  Child: 1.4 to 1.7 mEq/L  Newborn: 1.4 to 2 mEq/L Possible critical values: less than 0.5 mEq/L or greater than 3 mEq/L Ranges for normal findings may vary among different laboratories and hospitals. You should always check with your caregiver after having lab work or other tests done to discuss the meaning of your test results and whether your values are considered within normal limits. MEANING OF TEST  Your caregiver will go over the test results with you. Your caregiver will discuss the importance and meaning of your results. He or she will also discuss treatment options and additional tests, if needed. OBTAINING THE TEST RESULTS It is your responsibility to obtain your test results. Ask the lab or department performing the test when and how you will get your results. Document Released: 01/10/2005 Document Revised: 03/01/2012 Document Reviewed: 11/18/2008 [facility] Patient Information 2014 [place], [place].

## 2013-07-05 ENCOUNTER — Other Ambulatory Visit: Payer: Self-pay | Admitting: Physician Assistant

## 2013-07-06 ENCOUNTER — Telehealth: Payer: Self-pay | Admitting: *Deleted

## 2013-07-06 ENCOUNTER — Ambulatory Visit (HOSPITAL_BASED_OUTPATIENT_CLINIC_OR_DEPARTMENT_OTHER): Payer: BC Managed Care – PPO

## 2013-07-06 ENCOUNTER — Other Ambulatory Visit (HOSPITAL_BASED_OUTPATIENT_CLINIC_OR_DEPARTMENT_OTHER): Payer: BC Managed Care – PPO | Admitting: Lab

## 2013-07-06 ENCOUNTER — Ambulatory Visit (HOSPITAL_BASED_OUTPATIENT_CLINIC_OR_DEPARTMENT_OTHER): Payer: BC Managed Care – PPO | Admitting: Oncology

## 2013-07-06 VITALS — BP 138/74 | HR 68 | Temp 98.5°F | Resp 18 | Ht 67.0 in | Wt 172.5 lb

## 2013-07-06 DIAGNOSIS — Z9489 Other transplanted organ and tissue status: Secondary | ICD-10-CM

## 2013-07-06 DIAGNOSIS — D89811 Chronic graft-versus-host disease: Secondary | ICD-10-CM

## 2013-07-06 DIAGNOSIS — C9110 Chronic lymphocytic leukemia of B-cell type not having achieved remission: Secondary | ICD-10-CM

## 2013-07-06 LAB — CBC WITH DIFFERENTIAL/PLATELET
Basophils Absolute: 0 10*3/uL (ref 0.0–0.1)
HCT: 34.4 % — ABNORMAL LOW (ref 38.4–49.9)
HGB: 11.7 g/dL — ABNORMAL LOW (ref 13.0–17.1)
LYMPH%: 19.8 % (ref 14.0–49.0)
MCH: 32.7 pg (ref 27.2–33.4)
MONO#: 0.3 10*3/uL (ref 0.1–0.9)
NEUT%: 75.4 % — ABNORMAL HIGH (ref 39.0–75.0)
Platelets: 147 10*3/uL (ref 140–400)
WBC: 8 10*3/uL (ref 4.0–10.3)
lymph#: 1.6 10*3/uL (ref 0.9–3.3)

## 2013-07-06 LAB — COMPREHENSIVE METABOLIC PANEL (CC13)
AST: 31 U/L (ref 5–34)
Alkaline Phosphatase: 76 U/L (ref 40–150)
BUN: 35.8 mg/dL — ABNORMAL HIGH (ref 7.0–26.0)
Calcium: 9.2 mg/dL (ref 8.4–10.4)
Chloride: 101 mEq/L (ref 98–109)
Creatinine: 1 mg/dL (ref 0.7–1.3)
Total Bilirubin: 0.3 mg/dL (ref 0.20–1.20)

## 2013-07-06 MED ORDER — SODIUM CHLORIDE 0.9 % IJ SOLN
10.0000 mL | Freq: Once | INTRAMUSCULAR | Status: AC
  Administered 2013-07-06: 10 mL
  Filled 2013-07-06: qty 10

## 2013-07-06 MED ORDER — HEPARIN SOD (PORK) LOCK FLUSH 100 UNIT/ML IV SOLN
250.0000 [IU] | Freq: Once | INTRAVENOUS | Status: AC
  Administered 2013-07-06: 250 [IU] via INTRAVENOUS
  Filled 2013-07-06: qty 5

## 2013-07-06 MED ORDER — SODIUM CHLORIDE 0.9 % IV SOLN
Freq: Once | INTRAVENOUS | Status: AC
  Administered 2013-07-06: 15:00:00 via INTRAVENOUS
  Filled 2013-07-06: qty 500

## 2013-07-06 NOTE — Addendum Note (Signed)
Addended by: Laroy Apple E on: 07/06/2013 05:16 PM   Modules accepted: Orders

## 2013-07-06 NOTE — Telephone Encounter (Signed)
appts maed and printed . Pt is aware that MW will add the tx"s and ivf. i emailed MW to add the tx"s...td

## 2013-07-06 NOTE — Telephone Encounter (Signed)
Per staff phone call and POF I have schedueld appts.  JMW  

## 2013-07-06 NOTE — Progress Notes (Signed)
ID: Timothy [name]   DOB: Mar 12, 1946  MR#: [id number]  MVH#:[id number]  WUX:LKGM,W DOUGLAS, MD SU: OTHER MD: Timothy [name], Timothy [name]  HISTORY OF PRESENT ILLNESS: We have very complete records from Dr. Sydnee [name] in [place], and in summary:  The patient was initially diagnosed in August 2000, with a white cell count of 23,600, but normal hemoglobin and platelets, and no significant symptomatology. Over the next several years his white cell count drifted up, and he eventually developed some symptoms of night sweats in particular, leading to treatment with fludarabine, Cytoxan and rituxan for five cycles given between December 2006 and May 2007.  We have CT scans from June 2006, November 2006 and April 2007, and comparing the November 2006 and April 2007 scans, there was near complete response. He had subsequent therapy in [place] as detailed below, but with decreased response, leading to allogeneic stem-cell transplant at the [facility] 02/24/2012.  INTERVAL HISTORY: Timothy [name] returns with his wife Timothy [name]  for followup of his CLL, status post allogeneic stem cell transplant in [place] in March 2013. The interval history is generally stable  REVIEW OF SYSTEMS: Timothy [name] is walking about 30 minutes today, going to large stores and walking around the periphery ("where it school". He had his kyphoplasty x3 late June in his back pain improved significantly. That has helped is walking. He has undergone bilateral cataract surgery and is no longer requiring glasses. Of course he does need glasses to repeat. He has occasional palpitations, bruises easily, has a scraped in the left lateral forearm which is taking a long time to heal, and he continues to have loose bowel movements, although no longer diarrhea. He now knows when the bowel movements are coming and also when they are stopping. There have been no accidents. He denies problems with balance or dizziness, and there have been no falls. He  sugars are much better controlled at present. He continues to have a resting tremor. A detailed review of systems is otherwise noncontributory  PAST MEDICAL HISTORY: Past Medical History  Diagnosis Date  . 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 Diverticular disease   . Hyperlipidemia   . Obesity   . Hypertension   . Hiatal hernia   . CMV (cytomegalovirus) antibody positive     pre-transplant, with seroconversion x2 pst-transplant  . Right bundle branch block     pre-transplant  . CKD (chronic kidney disease) stage 2, GFR 60-89 ml/min   . Pancytopenia   . Steroid-induced diabetes   . Atrial fibrillation     post-transplant  . Myopathy   . 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 Leukemia, chronic lymphoid   . 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 Diarrhea in adult patient 12/05/2012    Due to active GVHD  . CLL (chronic lymphocytic leukemia) 12/05/2012    Dx 07/1999; started Rx 12/06  AlloBMT 3/13  . Rash of face 12/05/2012    Due to GVHD  . Hypomagnesemia 01/26/2013    PAST SURGICAL HISTORY: Past Surgical History  Procedure Laterality Date  . Tonsillectomy and adenoidectomy    . Bone marrow transplant    . Flexible sigmoidoscopy  11/17/2012    Procedure: FLEXIBLE SIGMOIDOSCOPY;  Surgeon: Timothy Kuba, MD;  Location: [facility];  Service: Endoscopy;  Laterality: N/A;  Dr Timothy [name] states will be admitted to rooom 1339 11/16/12  . Esophagogastroduodenoscopy  11/17/2012  Procedure: ESOPHAGOGASTRODUODENOSCOPY (EGD);  Surgeon: Timothy Kuba, MD;  Location: [facility];  Service: Endoscopy;  Laterality: N/A;    FAMILY HISTORY Family History  Problem Relation Age of Onset  . Cancer Father    The patient's father died from complications of chronic lymphocytic leukemia at the age of 80.  It had been diagnosed seven years before when he was 39.  The patient is enrolled  in a familial chronic lymphocytic leukemia study out of the [employer] [facility] [employer].  The patient's mother is 44, alive, unfortunately suffering with dementia, and he has a brother, 20, who is otherwise in fair health.   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  His wife of >40 years, Timothy [name], is a homemaker.  Their daughter, Timothy [name], lives in [place].  She also is a Futures trader.  The patient has an 17 year old grandson and an 63-year-old granddaughter, and that is really the main reason he moved to this area.  He is a International aid/development worker.     ADVANCED DIRECTIVES: In place  HEALTH MAINTENANCE: History  Substance Use Topics  . Smoking status: Never Smoker   . Smokeless tobacco: Never Used  . Alcohol Use: No     Colonoscopy:  PSA:  Bone density:  Lipid panel:  Allergies  Allergen Reactions  . Benadryl (Diphenhydramine Hcl)     "Restless leg syndrome"    Current Outpatient Prescriptions  Medication Sig Dispense Refill  . acyclovir (ZOVIRAX) 400 MG tablet Take 2 tablets (800 mg total) by mouth 2 (two) times daily.  60 tablet  12  . ANDRODERM 4 MG/24HR PT24 patch APPLY 1 PATCH TO SKIN, CHANGE PATCH DAILY. REMOVE OLD PATCH, FOLD & DISCARD AWAY FROM PETS & CHILDREN  30 patch  3  . B-D UF III MINI PEN NEEDLES 31G X 5 MM MISC       . Blood Glucose Monitoring Suppl (ONE TOUCH ULTRA 2) W/DEVICE KIT       . budesonide (ENTOCORT EC) 3 MG 24 hr capsule Take 3 mg by mouth 3 (three) times daily.       . Calcium Citrate (CITRACAL PO) Take 600 mg by mouth 4 (four) times daily - after meals and at bedtime.       . cholecalciferol (VITAMIN D) 1000 UNITS tablet Take 1,000 Units by mouth every evening.       . cholestyramine (QUESTRAN) 4 G packet Take 1 packet by mouth 2 (two) times daily with a meal.  60 each  1  . diltiazem (CARDIZEM CD) 240 MG 24 hr capsule Take 1 capsule (240 mg total)  by mouth daily.  30 capsule  3  . DUREZOL 0.05 % EMUL       . ferrous sulfate 325 (65 FE) MG tablet Take 325 mg by mouth daily with breakfast.      . fluconazole (DIFLUCAN) 100 MG tablet Take 1 tablet (100 mg total) by mouth every morning.  30 tablet  12  . furosemide (LASIX) 20 MG tablet Take 1 tablet (20 mg total) by mouth every morning.  30 tablet  2  . glucose blood (ONE TOUCH ULTRA TEST) test strip As directed  150 each  6  . Heparin Lock Flush (HEPARIN FLUSH, PORCINE,) 100 UNIT/ML injection 300 Units by Intracatheter route as needed (to flush Hickman port).       Marland Kitchen HYDROcodone-acetaminophen (NORCO) 10-325 MG per tablet  Take 1-2 tablets by mouth every 6 (six) hours as needed for pain (breakthrough pain).      Marland Kitchen labetalol (NORMODYNE) 200 MG tablet Take 1 tablet (200 mg total) by mouth 2 (two) times daily.  60 tablet  12  . Lidocaine-Hydrocortisone Ace 3-0.5 % KIT Apply 1 application topically as needed (for pain).       . moxifloxacin (VIGAMOX) 0.5 % ophthalmic solution Place 1 drop into the right eye 4 (four) times daily.      . Multiple Vitamins-Minerals (CENTRUM SILVER ADULT 50+) TABS Take 1 tablet by mouth every evening.       Marland Kitchen NOVOLOG MIX 70/30 FLEXPEN (70-30) 100 UNIT/ML SUPN INJECT 18 UNITS EVERY MORNING AND 9 UNITS WITH SUPPER  15 mL  1  . NOVOLOG MIX 70/30 FLEXPEN (70-30) 100 UNIT/ML SUPN INJECT 18 UNITS UNDER THE SKIN AND 9 UNITS IN THE EVENING AS DIRECTED  15 mL  1  . omeprazole (PRILOSEC) 20 MG capsule Take 1 capsule (20 mg total) by mouth daily.  90 capsule  4  . ONETOUCH DELICA LANCETS 33G MISC 33 g by Does not apply route as directed.  200 each  6  . oxyCODONE (OXYCONTIN) 10 MG 12 hr tablet Take 1 tablet (10 mg total) by mouth every 12 (twelve) hours.  60 tablet  0  . predniSONE (DELTASONE) 5 MG tablet Take 5-15 mg by mouth See admin instructions. 15mg  for 10 days; then 10mg  for 10 days, then 5mg  for 20 days      . PROTEIN PO Take 6 g by mouth 3 (three) times daily.      .  sertraline (ZOLOFT) 50 MG tablet Take 50-100 mg by mouth daily. Alternates 50mg  and 100mg  daily      . Sodium Chloride Flush (NORMAL SALINE FLUSH) 0.9 % SOLN 10 mLs by Intracatheter route as needed (to flush Hickman).       Marland Kitchen sulfamethoxazole-trimethoprim (BACTRIM DS) 800-160 MG per tablet Take 1 tablet by mouth daily.  30 tablet  11  . tacrolimus (PROGRAF) 0.5 MG capsule Take 1.5 mg by mouth 2 (two) times daily.      . vancomycin (VANCOCIN) 50 mg/mL oral solution Take 2.5 mLs (125 mg total) by mouth every 6 (six) hours.  300 mL  1  . zinc sulfate 220 MG capsule Take 220 mg by mouth daily.      . [DISCONTINUED] insulin aspart (NOVOLOG FLEXPEN) 100 UNIT/ML SOPN FlexPen 18units sq qam, 9units sq qpm, or as directed  15 mL  1   No current facility-administered medications for this visit.   Facility-Administered Medications Ordered in Other Visits  Medication Dose Route Frequency Provider Last Rate Last Dose  . 0.9 %  sodium chloride infusion   Intravenous Continuous Amy G Berry, PA-C 500 mL/hr at 03/12/13 0900    . sodium chloride 0.9 % injection 10 mL  10 mL Intravenous PRN Lowella Dell, MD   10 mL at 08/11/12 1606    OBJECTIVE: Middle-aged white male with noticeable fat redistribution secondary to steroids Filed Vitals:   07/06/13 1300  BP: 138/74  Pulse: 68  Temp: 98.5 F (36.9 C)  Resp: 18     Body mass index is 27.01 kg/(m^2).   Filed Weights   07/06/13 1300  Weight: 172 lb 8 oz (78.245 kg)   ECOG FS: 2  Face shows no rash, skin is smooth and soft Sclerae unicteric Oropharynx is clear, not dry Lungs clear to auscultation with good excursion  bilaterally, no rales or rhonchi, no wheezes Heart regular rate and rhythm, no murmur appreciated Abdomen  soft, nontender, positive bowel sounds Bilateral lower extremity edema, 2+ and symmetrical Neuro: nonfocal, well oriented, positive affect Few ecchymoses noted on the hands and upper extremities, unchanged    LAB  RESULTS:  CBC    Component Value Date/Time   WBC 8.0 07/06/2013 1250   WBC 5.6 06/14/2013 0800   RBC 3.59* 07/06/2013 1250   RBC 3.59* 06/14/2013 0800   HGB 11.7* 07/06/2013 1250   HGB 11.7* 06/14/2013 0800   HCT 34.4* 07/06/2013 1250   HCT 34.6* 06/14/2013 0800   PLT 147 07/06/2013 1250   PLT 83* 06/14/2013 0800   MCV 95.7 07/06/2013 1250   MCV 96.4 06/14/2013 0800   MCH 32.7 07/06/2013 1250   MCH 32.6 06/14/2013 0800   MCHC 34.2 07/06/2013 1250   MCHC 33.8 06/14/2013 0800   RDW 14.7* 07/06/2013 1250   RDW 14.7 06/14/2013 0800   LYMPHSABS 1.6 07/06/2013 1250   LYMPHSABS 1.4 03/18/2013 0615   MONOABS 0.3 07/06/2013 1250   MONOABS 0.3 03/18/2013 0615   EOSABS 0.0 07/06/2013 1250   EOSABS 0.0 03/18/2013 0615   BASOSABS 0.0 07/06/2013 1250   BASOSABS 0.0 03/18/2013 0615        Chemistry      Component Value Date/Time   NA 137 06/30/2013 1309   NA 131* 06/14/2013 0800   K 4.7 06/30/2013 1309   K 4.7 06/14/2013 0800   CL 101 06/15/2013 1034   CL 97 06/14/2013 0800   CO2 22 06/30/2013 1309   CO2 24 06/14/2013 0800   BUN 34.3* 06/30/2013 1309   BUN 38* 06/14/2013 0800   CREATININE 0.8 06/30/2013 1309   CREATININE 0.97 06/14/2013 0800      Component Value Date/Time   CALCIUM 8.7 06/30/2013 1309   CALCIUM 9.1 06/14/2013 0800   ALKPHOS 77 06/30/2013 1309   ALKPHOS 75 04/18/2013 1218   AST 28 06/30/2013 1309   AST 24 04/18/2013 1218   ALT 26 06/30/2013 1309   ALT 33 04/18/2013 1218   BILITOT 0.21 06/30/2013 1309   BILITOT 0.2* 04/18/2013 1218     Magnesium  1.9  06/06/2013    1.8  05/26/2013    1.9  05/23/2013    2.2  05/06/2013    1.9  04/20/2013    1.8  04/18/2013    2.2  04/13/2013  Hoping areas in the morning and in there is controversy about two thirds with the morning and in a "and 510.5 in a month Results for ESPN, ZEMAN (MRN [id number]) as of 07/06/2013 13:11  Ref. Range 05/06/2013 12:19 05/30/2013 10:49 06/08/2013 13:13 06/15/2013 10:34 06/30/2013 13:09  IgG (Immunoglobin G), Serum Latest Range:  915 777 2681 mg/dL 782 (L) 956 213 (L) 086 (L) 341 (L)    STUDIES:    Ir Radiologist Eval & Mgmt  06/06/2013   *RADIOLOGY REPORT*  Today's date:  06/01/2013  RADIOLOGIST EVALUATION AND MANAGEMENT/OUTPATIENT CONSULTATION LEVEL [id number]  Chief Complaint:  Low back pain.  History of Present Illness:  Mr. Lance is a 67 year old white male who has been referred to the Neuro Interventional Radiology Service by Dr. Marikay Alar Vincy Feliz for further evaluation and treatment of symptomatic thoraco- lumbar compression fractures.  The patient presents today with his wife for further discussion.  According to the patient he has been having low back pain for approximately one month's duration.  He states the pain is primarily in the mid to lower back  region and currently averages about a 6/10 on the pain scale.  He is taking hydrocodone and oxycodone with mild to moderate relief of the pain.  The patient states the pain is exacerbated with movement or prolonged sitting.  He says the pain is relieved somewhat when lying down or tilting forward from a seated position.  The patient states that he has fallen several times over the past 6 months but not enough to elicit a significant amount of back pain afterwards.  The patient denies any radiation of pain to the lower extremities.  He denies any associated paresthesias of the lower extremities.  He states he is having normal bowel movements  and able to urinate without difficulty.  He uses a walker to assist with ambulation.  The patient does have a history of osteoporosis as well as CLL and is status post allogeneic stem cell transplant in [place] in March of 2013. Secondary to reported history of low back pain the patient underwent MRI of the lumbar spine at [facility] on 05/09/2013.  This revealed compression fractures involving T12 ?L2, L3, L4 and L5.  There was mild superior endplate edema enhancement suggesting acute to subacute fractures with healing.  There was multilevel  degenerative changes throughout the lumbar spine.  There was spinal stenosis most severe at L4-5 with a moderate stenosis of the canal.  Of note,  the patient is scheduled to undergo cataract removal on June 12th and again on June 19th.  Past Medical History:  CLL, chronic graft versus host disease, diverticular disease, cataracts, hyperlipidemia, obesity, hypertension, hiatal hernia, CMV antibody positive, right bundle branch block, chronic kidney disease stage II, pancytopenia, steroid induced diabetes, past history of atrial fibrillation, fine tremor, hypomagnesemia, thrombocytopenia.  Past Surgical History:  Tonsillectomy, adenoidectomy, allogeneic stem cell transplant [place], March 2013.  Family History:  The patient's father died from complications of CLL at age 27.  The patient's mother is 67 with a history of dementia.  The patient has a brother 58 in fair health.  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 Lives in [place]. 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  The patient is married.  They have one daughter, Timothy [name],  who lives in [place].  The patient denies alcohol or tobacco use.  Allergies:  Benadryl.  Medications:  Zovirax, Biotin, Entocort EC, Citracal and vitamin D, Questran, Cardizem CD, Diflucan, Lasix, Norco, NovoLog 70/30, Normodyne, multivitamin, mupirocin ointment, Starlix, Prilosec, OxyContin, Deltasone, Zoloft, Bactrim DS, Prograf, Androderm, vancomycin capsules, zinc.  Review of Systems:  The patient denies recent fevers, chills, headaches, chest pain, shortness of breath, coughing, abdominal pain, nausea, vomiting, diarrhea, unusual bleeding.  Positives include mid to low back pain, swelling of the lower extremities, some blurring of vision secondary to cataracts.  Physical Exam:  The patient is awake, alert and oriented x3.  Chest is clear to auscultation bilaterally.  Heart with regular rate  and rhythm.  Abdomen soft, slightly protuberant, positive bowel sounds, nontender.  There is bilateral lower extremity edema 2-3+. Strength and sensory function is intact in all four extremities. The patient has mild to moderate paravertebral tenderness in the mid to lower lumbar spine region.  IMPRESSION: 1. Given the patient's pain is mostly in the lower lumbar region, vertebral body augmentation as will be performed at L3,L4 and  at L5.  The procedure, risks, benefits, and alternatives were reviewed with the patient the patient's wife  There  are in agreement to proceed witht vertebral body augmentation  in the form of kyphoplasty, for pain relief.  They both leave with clear understanding and agreement with the above mentioned plan.   Original Report Authenticated By: Julieanne Cotton, M.D.    ASSESSMENT: 67 y.o. [place] man with a history of well-differentiated lymphocytic lymphoma/ chronic lymphoid leukemia initially diagnosed in 2000, not requiring intervention until 2006; with multiple chromosomal abnormalities.  His treatment history is as follows:  (1) fludarabine/cyclophosphamide/rituximab x5 completed May 2007.   (2) rituximab for 8 doses October 2010, with partial response   (3) Leustatin and ofatumumab weekly x8 July to September 2011 followed by maintenance ofatumumab maintenance ofatumumab every 2 months, with initial response but rising counts September 2012   (4) status-post unrelated donor stem-cell transplant 02/24/2012 at [facility]  (a) conditioning regimen consisted of fludarabine + TBI at 200 cGy, followed by rituximab x27;  (b) CMV reactivation x3 (patient CMV positive, donor negative), s/p ganciclovir treatment; 3d reactivation August 2013, s/p gancyclovir, with negative PCR mid-September 2013; last gancyclovir dose 10/06/2012 (c) Chronic GVHD: involving gut and skin, treated with steroids, tacrolimus and MMF.  MMF was eventually d/c'd and tacrolimus currently at a dose  of 1.5mg  BID (d) atrial fibrillation: resolved on brief amiodarone regimen (e) steroid-induced myopathy: improving  (f) hypomagnesemia: improved after d/c gancyclovir (g) hypogammaglobulinemia: s/p IVIG most recently 01/07/2013. (h) history of elevated triglycerides (606 on 07/14/2012)  (i) adrenal insufficiency: on prednisone and budesonide (j) pancytopenia, improved  (5) restaging studies September 2013  including CT scans, flow cytometry, and bone marrow biopsy, showed no evidence of residual chronic lymphoid leukemia.  (6) recurrent GVHD (skin rash, mouth changes, severe diarrhea and gastric/duodenal/colonic biopsies 11/17/2012 c/w GVHD grade 2) now only remaining sign is bothersome diarrhea; c diff negative x3; continuing current regimen   (7) severe malnutrition -- on VITAL supplement in addition to regular diet; on Marinol for anorexia  (8) testosterone deficiency--on patch   (9) deconditioning: ongoing REHAB   (10) mild dehydration: encouraged increased po fluids; receives IVF support w magnesium three times weekly  (11) steroid-induced osteoporosis with compression fractures: received pamidronate 12/18/2012   (12) nausea: improved on current meds  (13)  Positive c.diff, 03/08/2013, on Flagyl 500 mg TID x 20 days, then on oral vanco with Questran, showing improvement  (14) hyperkalemia, improving  (15)  Hypertension, on labetalol, cardizem, and furosemide  (16) steroid induced hyperglycemia, on sterlix and 70/30 insulin  PLAN: Trammell is generally doing well, making very slow but steady progress. I feel comfortable stopping his vancomycin at this point. We're also going to drop his prednisone to 10 mg in the morning and 2.5 mg in the evening. A month from now I think if all remains stable we will go to 10 mg in the morning only and probably stay there at least 6 weeks before considering further changes.  Giving how much bone loss he has had I think he would benefit from  zoledronic acid. I have written for him to receive a dose of on July 31. He will receive this every 6 months for the next 2 years and then perhaps yearly for another 3 years. He is also due for IVIG replacement at this point and that will be done July 24.  We talked about the fact that there is a group from [place] that has joined the loading of group in [place] and doing collagen a transplant. It is another possible resource for him. He is very happy  with [place] low and he will be going back to [place] in February. Barring any other problems that remains the plan.  We will continue to provide him with fluids and magnesium on a once a week basis, with visits every 2 weeks.  Marland KitchenMAGRINAT,Mehar Kirkwood C    07/06/2013

## 2013-07-06 NOTE — Patient Instructions (Addendum)
Dehydration, Adult  Dehydration means your body does not have as much fluid as it needs. Your kidneys, brain, and heart will not work properly without the right amount of fluids and salt.   HOME CARE   Ask your doctor how to replace body fluid losses (rehydrate).   Drink enough fluids to keep your pee (urine) clear or pale yellow.   Drink small amounts of fluids often if you feel sick to your stomach (nauseous) or throw up (vomit).   Eat like you normally do.   Avoid:   Foods or drinks high in sugar.   Bubbly (carbonated) drinks.   Juice.   Very hot or cold fluids.   Drinks with caffeine.   Fatty, greasy foods.   Alcohol.   Tobacco.   Eating too much.   Gelatin desserts.   Wash your hands to avoid spreading germs (bacteria, viruses).   Only take medicine as told by your doctor.   Keep all doctor visits as told.  GET HELP RIGHT AWAY IF:    You cannot drink something without throwing up.   You get worse even with treatment.   Your vomit has blood in it or looks greenish.   Your poop (stool) has blood in it or looks black and tarry.   You have not peed in 6 to 8 hours.   You pee a small amount of very dark pee.   You have a fever.   You pass out (faint).   You have belly (abdominal) pain that gets worse or stays in one spot (localizes).   You have a rash, stiff neck, or bad headache.   You get easily annoyed, sleepy, or are hard to wake up.   You feel weak, dizzy, or very thirsty.  MAKE SURE YOU:    Understand these instructions.   Will watch your condition.   Will get help right away if you are not doing well or get worse.  Document Released: 10/04/2009 Document Revised: 03/01/2012 Document Reviewed: 07/28/2011  ExitCare Patient Information 2014 [employer].

## 2013-07-07 ENCOUNTER — Other Ambulatory Visit: Payer: Self-pay | Admitting: Physician Assistant

## 2013-07-07 LAB — IGG, IGA, IGM: IgM, Serum: 5 mg/dL — ABNORMAL LOW (ref 41–251)

## 2013-07-08 ENCOUNTER — Telehealth: Payer: Self-pay | Admitting: *Deleted

## 2013-07-08 NOTE — Telephone Encounter (Signed)
sw pt gv an lab appt for 07/14/13 @ 8:30 before his tx. Pt is aware.Marland Kitchentd

## 2013-07-13 ENCOUNTER — Other Ambulatory Visit: Payer: Self-pay | Admitting: Physician Assistant

## 2013-07-14 ENCOUNTER — Ambulatory Visit: Payer: Self-pay

## 2013-07-14 ENCOUNTER — Other Ambulatory Visit (HOSPITAL_BASED_OUTPATIENT_CLINIC_OR_DEPARTMENT_OTHER): Payer: BC Managed Care – PPO

## 2013-07-14 ENCOUNTER — Ambulatory Visit (HOSPITAL_BASED_OUTPATIENT_CLINIC_OR_DEPARTMENT_OTHER): Payer: BC Managed Care – PPO

## 2013-07-14 DIAGNOSIS — C9110 Chronic lymphocytic leukemia of B-cell type not having achieved remission: Secondary | ICD-10-CM

## 2013-07-14 DIAGNOSIS — D801 Nonfamilial hypogammaglobulinemia: Secondary | ICD-10-CM

## 2013-07-14 DIAGNOSIS — E291 Testicular hypofunction: Secondary | ICD-10-CM

## 2013-07-14 LAB — CBC WITH DIFFERENTIAL/PLATELET
Basophils Absolute: 0 10*3/uL (ref 0.0–0.1)
Eosinophils Absolute: 0 10*3/uL (ref 0.0–0.5)
HGB: 11.5 g/dL — ABNORMAL LOW (ref 13.0–17.1)
MCV: 94.8 fL (ref 79.3–98.0)
MONO%: 8 % (ref 0.0–14.0)
NEUT#: 5 10*3/uL (ref 1.5–6.5)
RDW: 14.4 % (ref 11.0–14.6)

## 2013-07-14 LAB — COMPREHENSIVE METABOLIC PANEL (CC13)
Albumin: 3.2 g/dL — ABNORMAL LOW (ref 3.5–5.0)
CO2: 23 mEq/L (ref 22–29)
Glucose: 106 mg/dl (ref 70–140)
Sodium: 136 mEq/L (ref 136–145)
Total Bilirubin: 0.29 mg/dL (ref 0.20–1.20)
Total Protein: 5.5 g/dL — ABNORMAL LOW (ref 6.4–8.3)

## 2013-07-14 MED ORDER — SODIUM CHLORIDE 0.9 % IV SOLN
Freq: Once | INTRAVENOUS | Status: AC
  Administered 2013-07-14: 10:00:00 via INTRAVENOUS
  Filled 2013-07-14: qty 500

## 2013-07-14 MED ORDER — HEPARIN SOD (PORK) LOCK FLUSH 100 UNIT/ML IV SOLN
500.0000 [IU] | Freq: Once | INTRAVENOUS | Status: AC
  Administered 2013-07-14: 250 [IU] via INTRAVENOUS
  Filled 2013-07-14: qty 5

## 2013-07-14 MED ORDER — SODIUM CHLORIDE 0.9 % IJ SOLN
10.0000 mL | INTRAMUSCULAR | Status: DC | PRN
  Administered 2013-07-14: 10 mL via INTRAVENOUS
  Filled 2013-07-14: qty 10

## 2013-07-14 MED ORDER — IMMUNE GLOBULIN (HUMAN) 20 GM/200ML IV SOLN
80.0000 g | Freq: Once | INTRAVENOUS | Status: AC
  Administered 2013-07-14: 80 g via INTRAVENOUS
  Filled 2013-07-14: qty 800

## 2013-07-14 NOTE — Patient Instructions (Addendum)
Hypomagnesemia Magnesium is a common ion (mineral) in the body which is needed for metabolism. It is about how the body handles food and other chemical reactions necessary for life. Only about 2% of the magnesium in our body is found in the blood. When this is low, it is called hypomagnesemia. The blood will measure only a tiny amount of the magnesium in our body. When it is low in our blood, it does not mean that the whole body supply is low. The normal serum concentration ranges from 1.8-2.5 mEq/L. When the level gets to be less than 1.0 mEq/L, a number of problems begin to happen.  CAUSES   Receiving intravenous fluids without magnesium replacement.  Loss of magnesium from the bowel by naso-gastric suction.  Loss of magnesium from nausea and vomiting or severe diarrhea. Any of the inflammatory bowel conditions can cause this.  Abuse of alcohol often leads to low serum magnesium.  An inherited form of magnesium loss happens when the kidneys lose magnesium. This is called familial or primary hypomagnesemia.  Some medications such as diuretics also cause the loss of magnesium. SYMPTOMS  These following problems are worse if the changes in magnesium levels come on suddenly.  Tremor.  Confusion.  Muscle weakness.  Over-sensitive to sights and sounds.  Sensitive reflexes.  Depression.  Muscular fibrillations.  Over-reactivity of the nerves.  Irritability.  Psychosis.  Spasms of the hand muscles.  Tetany (where the muscles go into uncontrollable spasms). DIAGNOSIS  This condition can be diagnosed by blood tests. TREATMENT   In emergency, magnesium can be given intravenously (by vein).  If the condition is less worrisome, it can be corrected by diet. High levels of magnesium are found in green leafy vegetables, peas, beans and nuts among other things. It can also be given through medications by mouth.  If it is being caused by medications, changes can be made.  If  alcohol is a problem, help is available if there are difficulties giving it up. Document Released: 09/03/2005 Document Revised: 03/01/2012 Document Reviewed: 07/28/2008 [facility] Patient Information 2014 [place], [place].   Immune Globulin Injection What is this medicine? IMMUNE GLOBULIN (im MUNE GLOB yoo lin) helps to prevent or reduce the severity of certain infections in patients who are at risk. This medicine is collected from the pooled blood of many donors. It is used to treat immune system problems, thrombocytopenia, and Kawasaki syndrome. This medicine may be used for other purposes; ask your health care provider or pharmacist if you have questions. What should I tell my health care provider before I take this medicine? They need to know if you have any of these conditions: - diabetes - extremely low or no immune antibodies in the blood - heart disease - history of blood clots - hyperprolinemia - infection in the blood, sepsis - kidney disease - taking medicine that may change kidney function - ask your health care provider about your medicine - an unusual or allergic reaction to human immune globulin, albumin, maltose, sucrose, polysorbate 80, other medicines, foods, dyes, or preservatives - pregnant or trying to get pregnant - breast-feeding How should I use this medicine? This medicine is for injection into a muscle or infusion into a vein or skin. It is usually given by a health care professional in a hospital or clinic setting. In rare cases, some brands of this medicine might be given at home. You will be taught how to give this medicine. Use exactly as directed. Take your medicine at regular  intervals. Do not take your medicine more often than directed. Talk to your pediatrician regarding the use of this medicine in children. Special care may be needed. Overdosage: If you think you have taken too much of this medicine contact a poison control center or emergency room  at once. NOTE: This medicine is only for you. Do not share this medicine with others. What if I miss a dose? It is important not to miss your dose. Call your doctor or health care professional if you are unable to keep an appointment. If you give yourself the medicine and you miss a dose, take it as soon as you can. If it is almost time for your next dose, take only that dose. Do not take double or extra doses. What may interact with this medicine? -aspirin and aspirin-like medicines -cisplatin -cyclosporine -medicines for infection like acyclovir, adefovir, amphotericin B, bacitracin, cidofovir, foscarnet, ganciclovir, gentamicin, pentamidine, vancomycin -NSAIDS, medicines for pain and inflammation, like ibuprofen or naproxen -pamidronate -vaccines -zoledronic acid This list may not describe all possible interactions. Give your health care provider a list of all the medicines, herbs, non-prescription drugs, or dietary supplements you use. Also tell them if you smoke, drink alcohol, or use illegal drugs. Some items may interact with your medicine. What should I watch for while using this medicine? Your condition will be monitored carefully while you are receiving this medicine. This medicine is made from pooled blood donations of many different people. It may be possible to pass an infection in this medicine. However, the donors are screened for infections and all products are tested for HIV and hepatitis. The medicine is treated to kill most or all bacteria and viruses. Talk to your doctor about the risks and benefits of this medicine. Do not have vaccinations for at least 14 days before, or until at least 3 months after receiving this medicine. What side effects may I notice from receiving this medicine? Side effects that you should report to your doctor or health care professional as soon as possible: -allergic reactions like skin rash, itching or hives, swelling of the face, lips, or  tongue -breathing problems -chest pain or tightness -fever, chills -headache with nausea, vomiting -neck pain or difficulty moving neck -pain when moving eyes -pain, swelling, warmth in the leg -problems with balance, talking, walking -sudden weight gain -swelling of the ankles, feet, hands -trouble passing urine or change in the amount of urine Side effects that usually do not require medical attention (report to your doctor or health care professional if they continue or are bothersome): -dizzy, drowsy -flushing -increased sweating -leg cramps -muscle aches and pains -pain at site where injected This list may not describe all possible side effects. Call your doctor for medical advice about side effects. You may report side effects to FDA at 1-800-FDA-1088. Where should I keep my medicine? Keep out of the reach of children. This drug is usually given in a hospital or clinic and will not be stored at home. In rare cases, some brands of this medicine may be given at home. If you are using this medicine at home, you will be instructed on how to store this medicine. Throw away any unused medicine after the expiration date on the label. NOTE: This sheet is a summary. It may not cover all possible information. If you have questions about this medicine, talk to your doctor, pharmacist, or health care provider.  2012, Elsevier/Gold Standard. (02/28/2009 11:44:49 AM)

## 2013-07-18 ENCOUNTER — Other Ambulatory Visit (HOSPITAL_COMMUNITY): Payer: Self-pay | Admitting: Interventional Radiology

## 2013-07-18 DIAGNOSIS — M549 Dorsalgia, unspecified: Secondary | ICD-10-CM

## 2013-07-18 DIAGNOSIS — IMO0002 Reserved for concepts with insufficient information to code with codable children: Secondary | ICD-10-CM

## 2013-07-19 ENCOUNTER — Ambulatory Visit (HOSPITAL_COMMUNITY)
Admission: RE | Admit: 2013-07-19 | Discharge: 2013-07-19 | Disposition: A | Discharge status: Home / Self Care | Payer: BC Managed Care – PPO | Source: Ambulatory Visit | Attending: Interventional Radiology | Admitting: Interventional Radiology

## 2013-07-19 ENCOUNTER — Telehealth: Payer: Self-pay | Admitting: *Deleted

## 2013-07-19 DIAGNOSIS — IMO0002 Reserved for concepts with insufficient information to code with codable children: Secondary | ICD-10-CM

## 2013-07-19 DIAGNOSIS — M549 Dorsalgia, unspecified: Secondary | ICD-10-CM

## 2013-07-19 NOTE — Telephone Encounter (Signed)
Patient calling to cancel Zometa infusion for 7/30. He has been to his dentist and there is high possibility that he may have a few cavities and need dental work. Appt for 7/30 cancelled, Timothy [name] [name] will be seeing patient on 7/31 and future plans on Zometa will be discussed then.

## 2013-07-20 ENCOUNTER — Telehealth: Payer: Self-pay | Admitting: *Deleted

## 2013-07-20 ENCOUNTER — Ambulatory Visit: Payer: Self-pay

## 2013-07-20 ENCOUNTER — Encounter: Payer: Self-pay | Admitting: *Deleted

## 2013-07-20 ENCOUNTER — Other Ambulatory Visit: Payer: Self-pay | Admitting: *Deleted

## 2013-07-20 DIAGNOSIS — A0471 Enterocolitis due to Clostridium difficile, recurrent: Secondary | ICD-10-CM

## 2013-07-20 MED ORDER — VANCOMYCIN 50 MG/ML ORAL SOLUTION: 125.0000 mg | Freq: Four times a day (QID) | ORAL | Status: DC

## 2013-07-20 NOTE — Telephone Encounter (Signed)
Patient wife calling to report that patient has been off cdiff treatment x 1 week, diarrhea has returned, 4 loose stools since midnight. Per Dr Darnelle Catalan, restart Vancomycin elixir qid. Bobby Rumpf at [facility] pharmacy, patient still has refill available. Patient and wife aware.

## 2013-07-21 ENCOUNTER — Other Ambulatory Visit (HOSPITAL_BASED_OUTPATIENT_CLINIC_OR_DEPARTMENT_OTHER): Payer: BC Managed Care – PPO | Admitting: Lab

## 2013-07-21 ENCOUNTER — Ambulatory Visit (HOSPITAL_BASED_OUTPATIENT_CLINIC_OR_DEPARTMENT_OTHER): Payer: BC Managed Care – PPO | Admitting: Physician Assistant

## 2013-07-21 ENCOUNTER — Telehealth: Payer: Self-pay | Admitting: *Deleted

## 2013-07-21 ENCOUNTER — Telehealth: Payer: Self-pay | Admitting: Oncology

## 2013-07-21 ENCOUNTER — Ambulatory Visit (HOSPITAL_BASED_OUTPATIENT_CLINIC_OR_DEPARTMENT_OTHER): Payer: BC Managed Care – PPO

## 2013-07-21 ENCOUNTER — Encounter: Payer: Self-pay | Admitting: Physician Assistant

## 2013-07-21 VITALS — BP 134/77 | HR 78 | Temp 98.3°F | Resp 20 | Ht 67.0 in | Wt 168.1 lb

## 2013-07-21 DIAGNOSIS — D809 Immunodeficiency with predominantly antibody defects, unspecified: Secondary | ICD-10-CM

## 2013-07-21 DIAGNOSIS — C9110 Chronic lymphocytic leukemia of B-cell type not having achieved remission: Secondary | ICD-10-CM

## 2013-07-21 DIAGNOSIS — T8600 Unspecified complication of bone marrow transplant: Secondary | ICD-10-CM

## 2013-07-21 DIAGNOSIS — IMO0002 Reserved for concepts with insufficient information to code with codable children: Secondary | ICD-10-CM

## 2013-07-21 DIAGNOSIS — G252 Other specified forms of tremor: Secondary | ICD-10-CM

## 2013-07-21 DIAGNOSIS — I10 Essential (primary) hypertension: Secondary | ICD-10-CM

## 2013-07-21 DIAGNOSIS — E860 Dehydration: Secondary | ICD-10-CM

## 2013-07-21 DIAGNOSIS — R894 Abnormal immunological findings in specimens from other organs, systems and tissues: Secondary | ICD-10-CM

## 2013-07-21 DIAGNOSIS — R768 Other specified abnormal immunological findings in serum: Secondary | ICD-10-CM

## 2013-07-21 DIAGNOSIS — D89811 Chronic graft-versus-host disease: Secondary | ICD-10-CM

## 2013-07-21 DIAGNOSIS — Z9489 Other transplanted organ and tissue status: Secondary | ICD-10-CM

## 2013-07-21 DIAGNOSIS — T8609 Other complications of bone marrow transplant: Secondary | ICD-10-CM

## 2013-07-21 DIAGNOSIS — M549 Dorsalgia, unspecified: Secondary | ICD-10-CM

## 2013-07-21 DIAGNOSIS — G8929 Other chronic pain: Secondary | ICD-10-CM

## 2013-07-21 DIAGNOSIS — R197 Diarrhea, unspecified: Secondary | ICD-10-CM

## 2013-07-21 DIAGNOSIS — E099 Drug or chemical induced diabetes mellitus without complications: Secondary | ICD-10-CM

## 2013-07-21 DIAGNOSIS — T380X5A Adverse effect of glucocorticoids and synthetic analogues, initial encounter: Secondary | ICD-10-CM

## 2013-07-21 LAB — CBC WITH DIFFERENTIAL/PLATELET
Eosinophils Absolute: 0 10*3/uL (ref 0.0–0.5)
MONO#: 0.3 10*3/uL (ref 0.1–0.9)
NEUT#: 3.4 10*3/uL (ref 1.5–6.5)
Platelets: 136 10*3/uL — ABNORMAL LOW (ref 140–400)
RBC: 3.78 10*6/uL — ABNORMAL LOW (ref 4.20–5.82)
RDW: 14.4 % (ref 11.0–14.6)
WBC: 5.6 10*3/uL (ref 4.0–10.3)
lymph#: 1.9 10*3/uL (ref 0.9–3.3)

## 2013-07-21 LAB — COMPREHENSIVE METABOLIC PANEL (CC13)
AST: 32 U/L (ref 5–34)
Albumin: 3.1 g/dL — ABNORMAL LOW (ref 3.5–5.0)
Alkaline Phosphatase: 70 U/L (ref 40–150)
BUN: 36.3 mg/dL — ABNORMAL HIGH (ref 7.0–26.0)
Potassium: 4.7 mEq/L (ref 3.5–5.1)
Sodium: 135 mEq/L — ABNORMAL LOW (ref 136–145)

## 2013-07-21 MED ORDER — HEPARIN SOD (PORK) LOCK FLUSH 100 UNIT/ML IV SOLN
500.0000 [IU] | Freq: Once | INTRAVENOUS | Status: AC
  Administered 2013-07-21: 250 [IU] via INTRAVENOUS
  Filled 2013-07-21: qty 5

## 2013-07-21 MED ORDER — SODIUM CHLORIDE 0.9 % IJ SOLN
10.0000 mL | INTRAMUSCULAR | Status: DC | PRN
  Administered 2013-07-21: 10 mL via INTRAVENOUS
  Filled 2013-07-21: qty 10

## 2013-07-21 MED ORDER — OXYCODONE HCL 10 MG PO TB12: 10.0000 mg | ORAL_TABLET | Freq: Two times a day (BID) | ORAL | Status: DC

## 2013-07-21 MED ORDER — SODIUM CHLORIDE 0.9 % IV SOLN
Freq: Once | INTRAVENOUS | Status: AC
  Administered 2013-07-21: 13:00:00 via INTRAVENOUS
  Filled 2013-07-21: qty 500

## 2013-07-21 NOTE — Patient Instructions (Addendum)
Hypomagnesemia Magnesium is a common ion (mineral) in the body which is needed for metabolism. It is about how the body handles food and other chemical reactions necessary for life. Only about 2% of the magnesium in our body is found in the blood. When this is low, it is called hypomagnesemia. The blood will measure only a tiny amount of the magnesium in our body. When it is low in our blood, it does not mean that the whole body supply is low. The normal serum concentration ranges from 1.8-2.5 mEq/L. When the level gets to be less than 1.0 mEq/L, a number of problems begin to happen.  CAUSES   Receiving intravenous fluids without magnesium replacement.  Loss of magnesium from the bowel by naso-gastric suction.  Loss of magnesium from nausea and vomiting or severe diarrhea. Any of the inflammatory bowel conditions can cause this.  Abuse of alcohol often leads to low serum magnesium.  An inherited form of magnesium loss happens when the kidneys lose magnesium. This is called familial or primary hypomagnesemia.  Some medications such as diuretics also cause the loss of magnesium. SYMPTOMS  These following problems are worse if the changes in magnesium levels come on suddenly.  Tremor.  Confusion.  Muscle weakness.  Over-sensitive to sights and sounds.  Sensitive reflexes.  Depression.  Muscular fibrillations.  Over-reactivity of the nerves.  Irritability.  Psychosis.  Spasms of the hand muscles.  Tetany (where the muscles go into uncontrollable spasms). DIAGNOSIS  This condition can be diagnosed by blood tests. TREATMENT   In emergency, magnesium can be given intravenously (by vein).  If the condition is less worrisome, it can be corrected by diet. High levels of magnesium are found in green leafy vegetables, peas, beans and nuts among other things. It can also be given through medications by mouth.  If it is being caused by medications, changes can be made.  If  alcohol is a problem, help is available if there are difficulties giving it up. Document Released: 09/03/2005 Document Revised: 03/01/2012 Document Reviewed: 07/28/2008 ExitCare Patient Information 2014 [employer].        

## 2013-07-21 NOTE — Telephone Encounter (Signed)
Per staff message and POF I have scheduled appts.  JMW  

## 2013-07-21 NOTE — Progress Notes (Signed)
ID: Timothy [name]   DOB: 11-02-1946  MR#: [id number]  MVH#:[id number]  WUX:LKGM,W DOUGLAS, MD SU: OTHER MD: Donzetta Starch, Romero Belling  HISTORY OF PRESENT ILLNESS: We have very complete records from Dr. Sydnee Levans in [place], and in summary:  The patient was initially diagnosed in August 2000, with a white cell count of 23,600, but normal hemoglobin and platelets, and no significant symptomatology. Over the next several years his white cell count drifted up, and he eventually developed some symptoms of night sweats in particular, leading to treatment with fludarabine, Cytoxan and rituxan for five cycles given between December 2006 and May 2007.  We have CT scans from June 2006, November 2006 and April 2007, and comparing the November 2006 and April 2007 scans, there was near complete response. He had subsequent therapy in [place] as detailed below, but with decreased response, leading to allogeneic stem-cell transplant at the [facility] 02/24/2012.  INTERVAL HISTORY: Timothy [name] returns with his wife Timothy [name]  for followup of his CLL, status post allogeneic stem cell transplant in [place] in March 2013.   Timothy [name] is really quite stable, and is gradually recovering history. He continues to walk on a regular basis, and they were able to go out with friends last night for the first time in 2 years. He continues to have chronic back pain, but this has improved significantly status post kyphoplasty. At its worst, the pain is now a 4 or 5 on a [name] of 1-10. He does have some pain both to the right and left of the lumbar sacral spine which appears muscular in origin. He has no radiating pain, and no numbness, tingling, or significant weakness in the lower extremities.  I will mention that Timothy [name]  vancomycin was discontinued 2 weeks ago. He began having diarrhea, however, yesterday and was directed to restart the vancomycin elixir 4 times a day per Dr. Darnelle Catalan. He also utilizes Questran as  needed. He's had no blood or mucus in the stool. He's had only mild abdominal cramping. He denies any fevers or chills.  REVIEW OF SYSTEMS: Timothy [name] has had no night sweats. He continues to bruise very easily but has had no abnormal bleeding. He denies any new rashes or skin changes. His appetite is "too good" and has had no nausea or emesis. He's had no cough, phlegm production, or chest pain. Shortness of breath with exertion is improving. He has occasional palpitations but no chest pain. He denies abnormal headaches or dizziness.  He continues to have a resting tremor which is stable.   A detailed review of systems is otherwise stable and noncontributory.  PAST MEDICAL HISTORY: Past Medical History  Diagnosis Date  . 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 Diverticular disease   . Hyperlipidemia   . Obesity   . Hypertension   . Hiatal hernia   . CMV (cytomegalovirus) antibody positive     pre-transplant, with seroconversion x2 pst-transplant  . Right bundle branch block     pre-transplant  . CKD (chronic kidney disease) stage 2, GFR 60-89 ml/min   . Pancytopenia   . Steroid-induced diabetes   . Atrial fibrillation     post-transplant  . Myopathy   . 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 Leukemia, chronic lymphoid   . 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 Diarrhea in adult patient 12/05/2012    Due to active GVHD  .  CLL (chronic lymphocytic leukemia) 12/05/2012    Dx 07/1999; started Rx 12/06  AlloBMT 3/13  . Rash of face 12/05/2012    Due to GVHD  . Hypomagnesemia 01/26/2013    PAST SURGICAL HISTORY: Past Surgical History  Procedure Laterality Date  . Tonsillectomy and adenoidectomy    . Bone marrow transplant    . Flexible sigmoidoscopy  11/17/2012    Procedure: FLEXIBLE SIGMOIDOSCOPY;  Surgeon: Petra Kuba, MD;  Location: [facility];  Service: Endoscopy;   Laterality: N/A;  Dr Ewing Schlein states will be admitted to rooom 1339 11/16/12  . Esophagogastroduodenoscopy  11/17/2012    Procedure: ESOPHAGOGASTRODUODENOSCOPY (EGD);  Surgeon: Petra Kuba, MD;  Location: [facility];  Service: Endoscopy;  Laterality: N/A;    FAMILY HISTORY Family History  Problem Relation Age of Onset  . Cancer Father    The patient's father died from complications of chronic lymphocytic leukemia at the age of 71.  It had been diagnosed seven years before when he was 23.  The patient is enrolled in a familial chronic lymphocytic leukemia study out of the [facility].  The patient's mother is 87, alive, unfortunately suffering with dementia, and he has a brother, 47, who is otherwise in fair health.   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  His wife of >40 years, Timothy [name], is a homemaker.  Their daughter, Timothy [name], lives in [place].  She also is a Futures trader.  The patient has an 18 year old grandson and an 91-year-old granddaughter, and that is really the main reason he moved to this area.  He is a International aid/development worker.     ADVANCED DIRECTIVES: In place  HEALTH MAINTENANCE: History  Substance Use Topics  . Smoking status: Never Smoker   . Smokeless tobacco: Never Used  . Alcohol Use: No     Colonoscopy:  PSA:  Bone density:  Lipid panel:  Allergies  Allergen Reactions  . Benadryl (Diphenhydramine Hcl)     "Restless leg syndrome"    Current Outpatient Prescriptions  Medication Sig Dispense Refill  . acyclovir (ZOVIRAX) 400 MG tablet Take 2 tablets (800 mg total) by mouth 2 (two) times daily.  60 tablet  12  . ANDRODERM 4 MG/24HR PT24 patch APPLY 1 PATCH TO SKIN, CHANGE PATCH DAILY. REMOVE OLD PATCH, FOLD & DISCARD AWAY FROM PETS & CHILDREN  30 patch  3  . B-D UF III MINI PEN NEEDLES 31G X 5 MM MISC       . Blood Glucose Monitoring Suppl  (ONE TOUCH ULTRA 2) W/DEVICE KIT       . budesonide (ENTOCORT EC) 3 MG 24 hr capsule Take 3 mg by mouth 3 (three) times daily.       . Calcium Citrate (CITRACAL PO) Take 600 mg by mouth 4 (four) times daily - after meals and at bedtime.       . cholecalciferol (VITAMIN D) 1000 UNITS tablet Take 1,000 Units by mouth every evening.       . cholestyramine (QUESTRAN) 4 G packet TAKE 1 PACKET BY MOUTH 2 TIMES DAILY WITH A MEAL.  60 each  1  . diltiazem (CARDIZEM CD) 240 MG 24 hr capsule Take 1 capsule (240 mg total) by mouth daily.  30 capsule  3  . ferrous sulfate 325 (65 FE) MG tablet Take 325 mg by mouth daily with breakfast.      .  fluconazole (DIFLUCAN) 100 MG tablet Take 1 tablet (100 mg total) by mouth every morning.  30 tablet  12  . furosemide (LASIX) 20 MG tablet Take 1 tablet (20 mg total) by mouth every morning.  30 tablet  2  . Heparin Lock Flush (HEPARIN FLUSH, PORCINE,) 100 UNIT/ML injection 300 Units by Intracatheter route as needed (to flush Hickman port).       Marland Kitchen HYDROcodone-acetaminophen (NORCO) 10-325 MG per tablet Take 1-2 tablets by mouth every 6 (six) hours as needed for pain (breakthrough pain).      Marland Kitchen labetalol (NORMODYNE) 200 MG tablet Take 1 tablet (200 mg total) by mouth 2 (two) times daily.  60 tablet  12  . Lidocaine-Hydrocortisone Ace 3-0.5 % KIT Apply 1 application topically as needed (for pain).       Marland Kitchen lisinopril (PRINIVIL,ZESTRIL) 10 MG tablet Take 10 mg by mouth daily.      . Multiple Vitamins-Minerals (CENTRUM SILVER ADULT 50+) TABS Take 1 tablet by mouth every evening.       Marland Kitchen NOVOLOG MIX 70/30 FLEXPEN (70-30) 100 UNIT/ML SUPN INJECT 18 UNITS EVERY MORNING AND 9 UNITS WITH SUPPER  15 mL  1  . omeprazole (PRILOSEC) 20 MG capsule Take 1 capsule (20 mg total) by mouth daily.  90 capsule  4  . ONETOUCH DELICA LANCETS 33G MISC 33 g by Does not apply route as directed.  200 each  6  . oxyCODONE (OXYCONTIN) 10 MG 12 hr tablet Take 1 tablet (10 mg total) by mouth every 12  (twelve) hours.  60 tablet  0  . predniSONE (DELTASONE) 5 MG tablet Take 5-15 mg by mouth See admin instructions. 15mg  for 10 days; then 10mg  for 10 days, then 5mg  for 20 days      . PROTEIN PO Take 6 g by mouth 3 (three) times daily.      . sertraline (ZOLOFT) 50 MG tablet Take 50-100 mg by mouth daily. Alternates 50mg  and 100mg  daily      . Sodium Chloride Flush (NORMAL SALINE FLUSH) 0.9 % SOLN 10 mLs by Intracatheter route as needed (to flush Hickman).       Marland Kitchen sulfamethoxazole-trimethoprim (BACTRIM DS) 800-160 MG per tablet Take 1 tablet by mouth daily.  30 tablet  11  . tacrolimus (PROGRAF) 0.5 MG capsule Take 1.5 mg by mouth 2 (two) times daily.      . vancomycin (VANCOCIN) 50 mg/mL oral solution Take 2.5 mLs (125 mg total) by mouth every 6 (six) hours.  140 mL  1  . zinc sulfate 220 MG capsule Take 220 mg by mouth daily.      . [DISCONTINUED] insulin aspart (NOVOLOG FLEXPEN) 100 UNIT/ML SOPN FlexPen 18units sq qam, 9units sq qpm, or as directed  15 mL  1   No current facility-administered medications for this visit.   Facility-Administered Medications Ordered in Other Visits  Medication Dose Route Frequency Provider Last Rate Last Dose  . 0.9 %  sodium chloride infusion   Intravenous Continuous Delila Kuklinski G Samir Ishaq, PA-C 500 mL/hr at 03/12/13 0900    . sodium chloride 0.9 % injection 10 mL  10 mL Intravenous PRN Lowella Dell, MD   10 mL at 08/11/12 1606    OBJECTIVE: Middle-aged white male who appears comfortable and is in no acute distress  Filed Vitals:   07/21/13 1121  BP: 134/77  Pulse: 78  Temp: 98.3 F (36.8 C)  Resp: 20     Body mass index is 26.32 kg/(m^2).  Filed Weights   07/21/13 1121  Weight: 168 lb 1.6 oz (76.25 kg)  ECOG FS: 1  Face shows no rash, skin is smooth and soft Sclerae unicteric Oropharynx is clear, no ulcerations noted Lungs clear to auscultation with good excursion bilaterally, no rales or rhonchi, no wheezes Heart regular rate and rhythm, no murmur  appreciated Abdomen  soft, nontender, positive bowel sounds Bilateral lower extremity edema, 2+ and symmetrical Neuro: nonfocal, well oriented, positive affect Scattered ecchymoses noted on the hands and upper extremities, unchanged MSK: No focal spinal tenderness to palpation. The areas of pain in the lower back are actually relieved with pressure.    LAB RESULTS:  CBC    Component Value Date/Time   WBC 5.6 07/21/2013 1108   WBC 5.6 06/14/2013 0800   RBC 3.78* 07/21/2013 1108   RBC 3.59* 06/14/2013 0800   RBC 3.82* 03/16/2013 1400   HGB 11.7* 07/21/2013 1108   HGB 11.7* 06/14/2013 0800   HCT 35.5* 07/21/2013 1108   HCT 34.6* 06/14/2013 0800   PLT 136* 07/21/2013 1108   PLT 83* 06/14/2013 0800   MCV 93.9 07/21/2013 1108   MCV 96.4 06/14/2013 0800   MCH 31.0 07/21/2013 1108   MCH 32.6 06/14/2013 0800   MCHC 33.0 07/21/2013 1108   MCHC 33.8 06/14/2013 0800   RDW 14.4 07/21/2013 1108   RDW 14.7 06/14/2013 0800   LYMPHSABS 1.9 07/21/2013 1108   LYMPHSABS 1.4 03/18/2013 0615   MONOABS 0.3 07/21/2013 1108   MONOABS 0.3 03/18/2013 0615   EOSABS 0.0 07/21/2013 1108   EOSABS 0.0 03/18/2013 0615   BASOSABS 0.0 07/21/2013 1108   BASOSABS 0.0 03/18/2013 0615        Chemistry      Component Value Date/Time   NA 136 07/14/2013 0859   NA 131* 06/14/2013 0800   K 4.3 07/14/2013 0859   K 4.7 06/14/2013 0800   CL 101 06/15/2013 1034   CL 97 06/14/2013 0800   CO2 23 07/14/2013 0859   CO2 24 06/14/2013 0800   BUN 34.5* 07/14/2013 0859   BUN 38* 06/14/2013 0800   CREATININE 0.9 07/14/2013 0859   CREATININE 0.97 06/14/2013 0800      Component Value Date/Time   CALCIUM 9.0 07/14/2013 0859   CALCIUM 9.1 06/14/2013 0800   ALKPHOS 79 07/14/2013 0859   ALKPHOS 75 04/18/2013 1218   AST 30 07/14/2013 0859   AST 24 04/18/2013 1218   ALT 30 07/14/2013 0859   ALT 33 04/18/2013 1218   BILITOT 0.29 07/14/2013 0859   BILITOT 0.2* 04/18/2013 1218      Magnesium  Pending 07/21/2013    1.7  07/06/2013    1.6  06/30/2013    1.9  06/06/2013    1.8  05/26/2013    1.9  05/23/2013    2.2  05/06/2013    1.9  04/20/2013    1.8  04/18/2013    2.2  04/13/2013    STUDIES:  No results found.   ASSESSMENT: 67 y.o. [place] man with a history of well-differentiated lymphocytic lymphoma/ chronic lymphoid leukemia initially diagnosed in 2000, not requiring intervention until 2006; with multiple chromosomal abnormalities.  His treatment history is as follows:  (1) fludarabine/cyclophosphamide/rituximab x5 completed May 2007.   (2) rituximab for 8 doses October 2010, with partial response   (3) Leustatin and ofatumumab weekly x8 July to September 2011 followed by maintenance ofatumumab maintenance ofatumumab every 2 months, with initial response but rising counts September 2012   (4) status-post  unrelated donor stem-cell transplant 02/24/2012 at the [facility]  (a) conditioning regimen consisted of fludarabine + TBI at 200 cGy, followed by rituximab x27;  (b) CMV reactivation x3 (patient CMV positive, donor negative), s/p ganciclovir treatment; 3d reactivation August 2013, s/p gancyclovir, with negative PCR mid-September 2013; last gancyclovir dose 10/06/2012 (c) Chronic GVHD: involving gut and skin, treated with steroids, tacrolimus and MMF.  MMF was eventually d/c'd and tacrolimus currently at a dose of 1.5mg  BID (d) atrial fibrillation: resolved on brief amiodarone regimen (e) steroid-induced myopathy: improving  (f) hypomagnesemia: improved after d/c gancyclovir (g) hypogammaglobulinemia: s/p IVIG most recently 01/07/2013. (h) history of elevated triglycerides (606 on 07/14/2012)  (i) adrenal insufficiency: on prednisone and budesonide (j) pancytopenia, improved  (5) restaging studies September 2013  including CT scans, flow cytometry, and bone marrow biopsy, showed no evidence of residual chronic lymphoid leukemia.  (6) recurrent  GVHD (skin rash, mouth changes, severe diarrhea and gastric/duodenal/colonic biopsies 11/17/2012 c/w GVHD grade 2) now only remaining sign is bothersome diarrhea; c diff negative x3; continuing current regimen   (7) severe malnutrition -- on VITAL supplement in addition to regular diet; on Marinol for anorexia  (8) testosterone deficiency--on patch   (9) deconditioning: ongoing REHAB   (10) mild dehydration: encouraged increased po fluids; receives IVF support w magnesium three times weekly  (11) steroid-induced osteoporosis with compression fractures: received pamidronate 12/18/2012   (12) nausea: improved on current meds  (13)  Positive c.diff, 03/08/2013, on Flagyl 500 mg TID x 20 days, then on oral vanco with Questran, showing improvement  (14) hyperkalemia, improving  (15)  Hypertension, on labetalol, cardizem, and furosemide  (16) steroid induced hyperglycemia, on sterlix and 70/30 insulin  PLAN: Shulem continues to feel well, and his strength continues to improve. As noted above, he will continue on the vancomycin, and continues Questran powder as needed for diarrhea. She will let us know, however, if this has not improved by Monday.  He'll continue on his current dose of prednisone, 10 mg in the morning and 2.5 mg in the evening. We will continue on this dose for approximately 4 weeks, then will likely drop the evening dose and take only 10 mg in the morning. We'll continue a very slow taper, staying at each dose for 4-6 weeks before making changes.   I have refilled Hudsen's OxyContin today which he takes for chronic back pain. He will receive IV fluids with magnesium today, and we'll continue to receive weekly infusions, with followup visits every other week.  We will hold zoledronic acid at this time pending additional dental evaluation. Our plan is to eventually initiate zoledronic acid, to be given every 6 months for 2 years, then annually for 3 additional years.  Of course  we'll continue to follow his immunoglobulins, and will repeat those labs next week. He will receive IVIG when needed.  All this was reviewed in detail today with Timothy [name] and Timothy [name], both of whom voice understanding and agreement. They will call with any changes or problems.   Timothy [name]    07/21/2013

## 2013-07-22 NOTE — Progress Notes (Signed)
This encounter was created in error - please disregard.

## 2013-07-27 ENCOUNTER — Other Ambulatory Visit: Payer: Self-pay

## 2013-07-28 ENCOUNTER — Other Ambulatory Visit (HOSPITAL_BASED_OUTPATIENT_CLINIC_OR_DEPARTMENT_OTHER): Payer: BC Managed Care – PPO

## 2013-07-28 ENCOUNTER — Ambulatory Visit (HOSPITAL_BASED_OUTPATIENT_CLINIC_OR_DEPARTMENT_OTHER): Payer: BC Managed Care – PPO

## 2013-07-28 DIAGNOSIS — C9110 Chronic lymphocytic leukemia of B-cell type not having achieved remission: Secondary | ICD-10-CM

## 2013-07-28 DIAGNOSIS — D89811 Chronic graft-versus-host disease: Secondary | ICD-10-CM

## 2013-07-28 DIAGNOSIS — D809 Immunodeficiency with predominantly antibody defects, unspecified: Secondary | ICD-10-CM

## 2013-07-28 LAB — COMPREHENSIVE METABOLIC PANEL (CC13)
AST: 39 U/L — ABNORMAL HIGH (ref 5–34)
BUN: 41.8 mg/dL — ABNORMAL HIGH (ref 7.0–26.0)
Calcium: 9.5 mg/dL (ref 8.4–10.4)
Chloride: 102 mEq/L (ref 98–109)
Creatinine: 1.2 mg/dL (ref 0.7–1.3)

## 2013-07-28 LAB — CBC WITH DIFFERENTIAL/PLATELET
BASO%: 0.1 % (ref 0.0–2.0)
EOS%: 0.1 % (ref 0.0–7.0)
MCH: 31.3 pg (ref 27.2–33.4)
MCHC: 33.4 g/dL (ref 32.0–36.0)
NEUT%: 72.2 % (ref 39.0–75.0)
RBC: 3.71 10*6/uL — ABNORMAL LOW (ref 4.20–5.82)
RDW: 14.4 % (ref 11.0–14.6)
lymph#: 2 10*3/uL (ref 0.9–3.3)
nRBC: 0 % (ref 0–0)

## 2013-07-28 MED ORDER — SODIUM CHLORIDE 0.9 % IV SOLN
Freq: Once | INTRAVENOUS | Status: AC
  Administered 2013-07-28: 12:00:00 via INTRAVENOUS

## 2013-07-28 MED ORDER — HEPARIN SOD (PORK) LOCK FLUSH 100 UNIT/ML IV SOLN
250.0000 [IU] | Freq: Once | INTRAVENOUS | Status: AC
  Administered 2013-07-28: 250 [IU] via INTRAVENOUS
  Filled 2013-07-28: qty 5

## 2013-07-28 MED ORDER — SODIUM CHLORIDE 0.9 % IV SOLN
Freq: Once | INTRAVENOUS | Status: AC
  Administered 2013-07-28: 12:00:00 via INTRAVENOUS
  Filled 2013-07-28: qty 500

## 2013-07-28 MED ORDER — SODIUM CHLORIDE 0.9 % IJ SOLN
10.0000 mL | Freq: Once | INTRAMUSCULAR | Status: AC
  Administered 2013-07-28: 10 mL
  Filled 2013-07-28: qty 10

## 2013-07-28 NOTE — Patient Instructions (Addendum)
Hypomagnesemia Magnesium is a common ion (mineral) in the body which is needed for metabolism. It is about how the body handles food and other chemical reactions necessary for life. Only about 2% of the magnesium in our body is found in the blood. When this is low, it is called hypomagnesemia. The blood will measure only a tiny amount of the magnesium in our body. When it is low in our blood, it does not mean that the whole body supply is low. The normal serum concentration ranges from 1.8-2.5 mEq/L. When the level gets to be less than 1.0 mEq/L, a number of problems begin to happen.  CAUSES   Receiving intravenous fluids without magnesium replacement.  Loss of magnesium from the bowel by naso-gastric suction.  Loss of magnesium from nausea and vomiting or severe diarrhea. Any of the inflammatory bowel conditions can cause this.  Abuse of alcohol often leads to low serum magnesium.  An inherited form of magnesium loss happens when the kidneys lose magnesium. This is called familial or primary hypomagnesemia.  Some medications such as diuretics also cause the loss of magnesium. SYMPTOMS  These following problems are worse if the changes in magnesium levels come on suddenly.  Tremor.  Confusion.  Muscle weakness.  Over-sensitive to sights and sounds.  Sensitive reflexes.  Depression.  Muscular fibrillations.  Over-reactivity of the nerves.  Irritability.  Psychosis.  Spasms of the hand muscles.  Tetany (where the muscles go into uncontrollable spasms). DIAGNOSIS  This condition can be diagnosed by blood tests. TREATMENT   In emergency, magnesium can be given intravenously (by vein).  If the condition is less worrisome, it can be corrected by diet. High levels of magnesium are found in green leafy vegetables, peas, beans and nuts among other things. It can also be given through medications by mouth.  If it is being caused by medications, changes can be made.  If  alcohol is a problem, help is available if there are difficulties giving it up. Document Released: 09/03/2005 Document Revised: 03/01/2012 Document Reviewed: 07/28/2008 ExitCare Patient Information 2014 [employer].        

## 2013-07-29 LAB — IGG, IGA, IGM
IgA: <6 mg/dL — ABNORMAL LOW (ref 68–379)
IgG (Immunoglobin G), Serum: 825 mg/dL (ref 650–1600)

## 2013-08-01 ENCOUNTER — Other Ambulatory Visit: Payer: Self-pay | Admitting: Adult Health

## 2013-08-01 ENCOUNTER — Other Ambulatory Visit: Payer: Self-pay | Admitting: *Deleted

## 2013-08-02 ENCOUNTER — Other Ambulatory Visit: Payer: Self-pay | Admitting: *Deleted

## 2013-08-02 ENCOUNTER — Other Ambulatory Visit: Payer: Self-pay | Admitting: Physician Assistant

## 2013-08-02 DIAGNOSIS — C9110 Chronic lymphocytic leukemia of B-cell type not having achieved remission: Secondary | ICD-10-CM

## 2013-08-02 DIAGNOSIS — D89811 Chronic graft-versus-host disease: Secondary | ICD-10-CM

## 2013-08-02 MED ORDER — PREDNISONE 10 MG PO TABS: 10.0000 mg | ORAL_TABLET | Freq: Every day | ORAL | Status: DC

## 2013-08-03 ENCOUNTER — Other Ambulatory Visit: Payer: Self-pay | Admitting: Physician Assistant

## 2013-08-03 DIAGNOSIS — D809 Immunodeficiency with predominantly antibody defects, unspecified: Secondary | ICD-10-CM

## 2013-08-03 DIAGNOSIS — C9110 Chronic lymphocytic leukemia of B-cell type not having achieved remission: Secondary | ICD-10-CM

## 2013-08-03 DIAGNOSIS — Z9489 Other transplanted organ and tissue status: Secondary | ICD-10-CM

## 2013-08-03 DIAGNOSIS — D89811 Chronic graft-versus-host disease: Secondary | ICD-10-CM

## 2013-08-04 ENCOUNTER — Encounter: Payer: Self-pay | Admitting: Physician Assistant

## 2013-08-04 ENCOUNTER — Other Ambulatory Visit (HOSPITAL_BASED_OUTPATIENT_CLINIC_OR_DEPARTMENT_OTHER): Payer: BC Managed Care – PPO | Admitting: Lab

## 2013-08-04 ENCOUNTER — Ambulatory Visit (HOSPITAL_BASED_OUTPATIENT_CLINIC_OR_DEPARTMENT_OTHER): Payer: BC Managed Care – PPO

## 2013-08-04 ENCOUNTER — Other Ambulatory Visit: Payer: Self-pay | Admitting: *Deleted

## 2013-08-04 ENCOUNTER — Telehealth: Payer: Self-pay | Admitting: *Deleted

## 2013-08-04 ENCOUNTER — Ambulatory Visit (HOSPITAL_BASED_OUTPATIENT_CLINIC_OR_DEPARTMENT_OTHER): Payer: BC Managed Care – PPO | Admitting: Physician Assistant

## 2013-08-04 VITALS — BP 157/82 | HR 73 | Temp 98.3°F | Resp 20 | Ht 67.0 in | Wt 171.2 lb

## 2013-08-04 DIAGNOSIS — R197 Diarrhea, unspecified: Secondary | ICD-10-CM

## 2013-08-04 DIAGNOSIS — M549 Dorsalgia, unspecified: Secondary | ICD-10-CM

## 2013-08-04 DIAGNOSIS — D89811 Chronic graft-versus-host disease: Secondary | ICD-10-CM

## 2013-08-04 DIAGNOSIS — E860 Dehydration: Secondary | ICD-10-CM

## 2013-08-04 DIAGNOSIS — D809 Immunodeficiency with predominantly antibody defects, unspecified: Secondary | ICD-10-CM

## 2013-08-04 DIAGNOSIS — I10 Essential (primary) hypertension: Secondary | ICD-10-CM

## 2013-08-04 DIAGNOSIS — C9110 Chronic lymphocytic leukemia of B-cell type not having achieved remission: Secondary | ICD-10-CM

## 2013-08-04 DIAGNOSIS — Z9489 Other transplanted organ and tissue status: Secondary | ICD-10-CM

## 2013-08-04 DIAGNOSIS — E875 Hyperkalemia: Secondary | ICD-10-CM

## 2013-08-04 DIAGNOSIS — E099 Drug or chemical induced diabetes mellitus without complications: Secondary | ICD-10-CM

## 2013-08-04 DIAGNOSIS — G8929 Other chronic pain: Secondary | ICD-10-CM

## 2013-08-04 DIAGNOSIS — E139 Other specified diabetes mellitus without complications: Secondary | ICD-10-CM

## 2013-08-04 DIAGNOSIS — R5381 Other malaise: Secondary | ICD-10-CM

## 2013-08-04 LAB — CBC WITH DIFFERENTIAL/PLATELET
BASO%: 0.1 % (ref 0.0–2.0)
EOS%: 0.1 % (ref 0.0–7.0)
HCT: 34.9 % — ABNORMAL LOW (ref 38.4–49.9)
LYMPH%: 35 % (ref 14.0–49.0)
MCH: 31.3 pg (ref 27.2–33.4)
MCHC: 33.2 g/dL (ref 32.0–36.0)
MCV: 94.1 fL (ref 79.3–98.0)
MONO%: 7.4 % (ref 0.0–14.0)
NEUT%: 57.4 % (ref 39.0–75.0)
Platelets: 117 10*3/uL — ABNORMAL LOW (ref 140–400)
RBC: 3.71 10*6/uL — ABNORMAL LOW (ref 4.20–5.82)
WBC: 6.9 10*3/uL (ref 4.0–10.3)
nRBC: 0 % (ref 0–0)

## 2013-08-04 LAB — COMPREHENSIVE METABOLIC PANEL (CC13)
ALT: 40 U/L (ref 0–55)
AST: 42 U/L — ABNORMAL HIGH (ref 5–34)
BUN: 39.2 mg/dL — ABNORMAL HIGH (ref 7.0–26.0)
Calcium: 9 mg/dL (ref 8.4–10.4)
Creatinine: 1 mg/dL (ref 0.7–1.3)
Total Bilirubin: 0.27 mg/dL (ref 0.20–1.20)

## 2013-08-04 LAB — MAGNESIUM (CC13): Magnesium: 1.7 mg/dl (ref 1.5–2.5)

## 2013-08-04 MED ORDER — SODIUM CHLORIDE 0.9 % IV SOLN
Freq: Once | INTRAVENOUS | Status: AC
  Administered 2013-08-04: 13:00:00 via INTRAVENOUS
  Filled 2013-08-04: qty 500

## 2013-08-04 MED ORDER — HYDROCODONE-ACETAMINOPHEN 10-325 MG PO TABS: 1.0000 | ORAL_TABLET | Freq: Four times a day (QID) | ORAL | Status: DC | PRN

## 2013-08-04 MED ORDER — TACROLIMUS 0.5 MG PO CAPS: 1.5000 mg | ORAL_CAPSULE | Freq: Two times a day (BID) | ORAL | Status: DC

## 2013-08-04 MED ORDER — FUROSEMIDE 20 MG PO TABS: 20.0000 mg | ORAL_TABLET | ORAL | Status: DC

## 2013-08-04 NOTE — Progress Notes (Signed)
ID: Timothy [name]   DOB: 09/23/1946  MR#: [id number]  NWG#:[id number]  VHQ:IONG,E DOUGLAS, MD SU: OTHER MD: Donzetta Starch, Romero Belling  HISTORY OF PRESENT ILLNESS: We have very complete records from Dr. Sydnee Levans in [place], and in summary:  The patient was initially diagnosed in August 2000, with a white cell count of 23,600, but normal hemoglobin and platelets, and no significant symptomatology. Over the next several years his white cell count drifted up, and he eventually developed some symptoms of night sweats in particular, leading to treatment with fludarabine, Cytoxan and rituxan for five cycles given between December 2006 and May 2007.  We have CT scans from June 2006, November 2006 and April 2007, and comparing the November 2006 and April 2007 scans, there was near complete response. He had subsequent therapy in [place] as detailed below, but with decreased response, leading to allogeneic stem-cell transplant at the [facility] 02/24/2012.  INTERVAL HISTORY: Timothy [name] returns with his wife Timothy [name]  for followup of his CLL, status post allogeneic stem cell transplant in [place] in March 2013.   Timothy [name] continues to appear stable, and is gradually "returning to normal". 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 He is walking more regularly, but still feels "weak in his back". His pain has improved since the kyphoplasty, but still remains. He takes occasional doses of hydrocodone/APAP in addition to his OxyContin which controls the pain very well. He's had no numbness, tingling, or weakness in the lower extremities. He denies any additional pain elsewhere.  REVIEW OF SYSTEMS: Timothy [name] has had no fevers, chills, or night sweats. He continues to bruise very easily but has had no abnormal bleeding. He denies any new rashes or skin changes. His appetite is good and has had no nausea or emesis. His  diarrhea has improved significantly, although his stools are still slightly loose. He continues on vancomycin as before , and has Questran to use as needed.  He's had no cough, phlegm production, or chest pain. His shortness of breath with exertion is improving. He has occasional palpitations but no chest pain. He denies abnormal headaches or dizziness.  He continues to have a resting tremor which is stable.   A detailed review of systems is otherwise stable and noncontributory.  PAST MEDICAL HISTORY: Past Medical History  Diagnosis Date  . 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 Diverticular disease   . Hyperlipidemia   . Obesity   . Hypertension   . Hiatal hernia   . CMV (cytomegalovirus) antibody positive     pre-transplant, with seroconversion x2 pst-transplant  . Right bundle branch block     pre-transplant  . CKD (chronic kidney disease) stage 2, GFR 60-89 ml/min   . Pancytopenia   . Steroid-induced diabetes   . Atrial fibrillation     post-transplant  . Myopathy   . 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 Leukemia, chronic lymphoid   . 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 Diarrhea in adult patient 12/05/2012    Due to active GVHD  . CLL (chronic lymphocytic leukemia) 12/05/2012    Dx 07/1999; started Rx 12/06  AlloBMT 3/13  . Rash of face 12/05/2012    Due to GVHD  . Hypomagnesemia 01/26/2013    PAST SURGICAL HISTORY: Past Surgical History  Procedure Laterality Date  .  Tonsillectomy and adenoidectomy    . Bone marrow transplant    . Flexible sigmoidoscopy  11/17/2012    Procedure: FLEXIBLE SIGMOIDOSCOPY;  Surgeon: Petra Kuba, MD;  Location: [facility];  Service: Endoscopy;  Laterality: N/A;  Dr Ewing Schlein states will be admitted to rooom 1339 11/16/12  . Esophagogastroduodenoscopy  11/17/2012    Procedure: ESOPHAGOGASTRODUODENOSCOPY (EGD);  Surgeon: Petra Kuba, MD;  Location: [facility];  Service: Endoscopy;  Laterality: N/A;    FAMILY HISTORY Family History  Problem Relation Age of Onset  . Cancer Father    The patient's father died from complications of chronic lymphocytic leukemia at the age of 35.  It had been diagnosed seven years before when he was 100.  The patient is enrolled in a familial chronic lymphocytic leukemia study out of the [facility].  The patient's mother is 47, alive, unfortunately suffering with dementia, and he has a brother, 28, who is otherwise in fair health.   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  His wife of >40 years, Timothy [name], is a homemaker.  Their daughter, [name], lives in [place].  She also is a Futures trader.  The patient has an 74 year old grandson and an 21-year-old granddaughter, and that is really the main reason he moved to this area.  He is a International aid/development worker.     ADVANCED DIRECTIVES: In place  HEALTH MAINTENANCE: History  Substance Use Topics  . Smoking status: Never Smoker   . Smokeless tobacco: Never Used  . Alcohol Use: No     Colonoscopy:  PSA:  Bone density:  Lipid panel:  Allergies  Allergen Reactions  . Benadryl [Diphenhydramine Hcl]     "Restless leg syndrome"    Current Outpatient Prescriptions  Medication Sig Dispense Refill  . acyclovir (ZOVIRAX) 400 MG tablet Take 2 tablets (800 mg total) by mouth 2 (two) times daily.  60 tablet  12  . ANDRODERM 4 MG/24HR PT24 patch APPLY 1 PATCH TO SKIN, CHANGE PATCH DAILY. REMOVE OLD PATCH, FOLD & DISCARD AWAY FROM PETS & CHILDREN  30 patch  3  . B-D UF III MINI PEN NEEDLES 31G X 5 MM MISC       . Blood Glucose Monitoring Suppl (ONE TOUCH ULTRA 2) W/DEVICE KIT       . budesonide (ENTOCORT EC) 3 MG 24 hr capsule Take 3 mg by mouth 3 (three) times daily.       . Calcium Citrate (CITRACAL PO) Take 600 mg by  mouth 4 (four) times daily - after meals and at bedtime.       . cholecalciferol (VITAMIN D) 1000 UNITS tablet Take 1,000 Units by mouth every evening.       . cholestyramine (QUESTRAN) 4 G packet TAKE 1 PACKET BY MOUTH 2 TIMES DAILY WITH A MEAL.  60 each  1  . diltiazem (CARDIZEM CD) 240 MG 24 hr capsule Take 1 capsule (240 mg total) by mouth daily.  30 capsule  3  . ferrous sulfate 325 (65 FE) MG tablet Take 325 mg by mouth daily with breakfast.      . fluconazole (DIFLUCAN) 100 MG tablet Take 1 tablet (100 mg total) by mouth every morning.  30 tablet  12  . furosemide (LASIX) 20 MG tablet Take 1 tablet (20 mg total) by mouth every morning.  30 tablet  2  . Heparin Lock Flush (  HEPARIN FLUSH, PORCINE,) 100 UNIT/ML injection 300 Units by Intracatheter route as needed (to flush Hickman port).       Marland Kitchen HYDROcodone-acetaminophen (NORCO) 10-325 MG per tablet Take 1-2 tablets by mouth every 6 (six) hours as needed for pain (breakthrough pain).  60 tablet  0  . labetalol (NORMODYNE) 200 MG tablet Take 1 tablet (200 mg total) by mouth 2 (two) times daily.  60 tablet  12  . Lidocaine-Hydrocortisone Ace 3-0.5 % KIT Apply 1 application topically as needed (for pain).       Marland Kitchen lisinopril (PRINIVIL,ZESTRIL) 10 MG tablet Take 10 mg by mouth daily.      . Multiple Vitamins-Minerals (CENTRUM SILVER ADULT 50+) TABS Take 1 tablet by mouth every evening.       Marland Kitchen NOVOLOG MIX 70/30 FLEXPEN (70-30) 100 UNIT/ML SUPN INJECT 18 UNITS EVERY MORNING AND 9 UNITS WITH SUPPER  15 mL  1  . omeprazole (PRILOSEC) 20 MG capsule Take 1 capsule (20 mg total) by mouth daily.  90 capsule  4  . ONETOUCH DELICA LANCETS 33G MISC 33 g by Does not apply route as directed.  200 each  6  . oxyCODONE (OXYCONTIN) 10 MG 12 hr tablet Take 1 tablet (10 mg total) by mouth every 12 (twelve) hours.  60 tablet  0  . predniSONE (DELTASONE) 10 MG tablet Take 1 tablet (10 mg total) by mouth daily.  30 tablet  2  . predniSONE (DELTASONE) 5 MG tablet Take  5-15 mg by mouth See admin instructions. 15mg  for 10 days; then 10mg  for 10 days, then 5mg  for 20 days      . PROTEIN PO Take 6 g by mouth 3 (three) times daily.      . sertraline (ZOLOFT) 50 MG tablet Take 50-100 mg by mouth daily. Alternates 50mg  and 100mg  daily      . Sodium Chloride Flush (NORMAL SALINE FLUSH) 0.9 % SOLN 10 mLs by Intracatheter route as needed (to flush Hickman).       Marland Kitchen sulfamethoxazole-trimethoprim (BACTRIM DS) 800-160 MG per tablet Take 1 tablet by mouth daily.  30 tablet  11  . tacrolimus (PROGRAF) 0.5 MG capsule Take 3 capsules (1.5 mg total) by mouth 2 (two) times daily.  180 capsule  2  . vancomycin (VANCOCIN) 50 mg/mL oral solution Take 2.5 mLs (125 mg total) by mouth every 6 (six) hours.  140 mL  1  . zinc sulfate 220 MG capsule Take 220 mg by mouth daily.      . [DISCONTINUED] insulin aspart (NOVOLOG FLEXPEN) 100 UNIT/ML SOPN FlexPen 18units sq qam, 9units sq qpm, or as directed  15 mL  1   No current facility-administered medications for this visit.   Facility-Administered Medications Ordered in Other Visits  Medication Dose Route Frequency Provider Last Rate Last Dose  . 0.9 %  sodium chloride infusion   Intravenous Continuous Whit Bruni G Alvah Lagrow, PA-C 500 mL/hr at 03/12/13 0900    . sodium chloride 0.9 % injection 10 mL  10 mL Intravenous PRN Lowella Dell, MD   10 mL at 08/11/12 1606    OBJECTIVE: Middle-aged white male who appears comfortable and is in no acute distress  Filed Vitals:   08/04/13 1145  BP: 157/82  Pulse: 73  Temp: 98.3 F (36.8 C)  Resp: 20     Body mass index is 26.81 kg/(m^2).   Filed Weights   08/04/13 1145  Weight: 171 lb 3.2 oz (77.656 kg)  ECOG FS: 1  Face shows no rash, skin is smooth and soft Sclerae unicteric Oropharynx is clear, no ulcerations noted Lungs clear to auscultation with good excursion bilaterally, no rales or rhonchi, no wheezes Heart regular rate and rhythm, no murmur appreciated Abdomen  soft, nontender to  palpation, positive bowel sounds Bilateral lower extremity edema, improved,  1+ and symmetrical Neuro: nonfocal, well oriented, positive affect Scattered ecchymoses noted on the hands and upper extremities, unchanged MSK: No focal spinal tenderness to palpation.     LAB RESULTS:  CBC    Component Value Date/Time   WBC 6.9 08/04/2013 1135   WBC 5.6 06/14/2013 0800   RBC 3.71* 08/04/2013 1135   RBC 3.59* 06/14/2013 0800   RBC 3.82* 03/16/2013 1400   HGB 11.6* 08/04/2013 1135   HGB 11.7* 06/14/2013 0800   HCT 34.9* 08/04/2013 1135   HCT 34.6* 06/14/2013 0800   PLT 117* 08/04/2013 1135   PLT 83* 06/14/2013 0800   MCV 94.1 08/04/2013 1135   MCV 96.4 06/14/2013 0800   MCH 31.3 08/04/2013 1135   MCH 32.6 06/14/2013 0800   MCHC 33.2 08/04/2013 1135   MCHC 33.8 06/14/2013 0800   RDW 14.6 08/04/2013 1135   RDW 14.7 06/14/2013 0800   LYMPHSABS 2.4 08/04/2013 1135   LYMPHSABS 1.4 03/18/2013 0615   MONOABS 0.5 08/04/2013 1135   MONOABS 0.3 03/18/2013 0615   EOSABS 0.0 08/04/2013 1135   EOSABS 0.0 03/18/2013 0615   BASOSABS 0.0 08/04/2013 1135   BASOSABS 0.0 03/18/2013 0615        Chemistry      Component Value Date/Time   NA 133* 08/04/2013 1135   NA 131* 06/14/2013 0800   K 5.0 08/04/2013 1135   K 4.7 06/14/2013 0800   CL 101 06/15/2013 1034   CL 97 06/14/2013 0800   CO2 22 08/04/2013 1135   CO2 24 06/14/2013 0800   BUN 39.2* 08/04/2013 1135   BUN 38* 06/14/2013 0800   CREATININE 1.0 08/04/2013 1135   CREATININE 0.97 06/14/2013 0800      Component Value Date/Time   CALCIUM 9.0 08/04/2013 1135   CALCIUM 9.1 06/14/2013 0800   ALKPHOS 76 08/04/2013 1135   ALKPHOS 75 04/18/2013 1218   AST 42* 08/04/2013 1135   AST 24 04/18/2013 1218   ALT 40 08/04/2013 1135   ALT 33 04/18/2013 1218   BILITOT 0.27 08/04/2013 1135   BILITOT 0.2* 04/18/2013 1218      Magnesium  1.7  08/04/2013    1.5  07/21/2013    1.7  07/06/2013    1.6  06/30/2013    1.9  06/06/2013    1.8  05/26/2013    1.9  05/23/2013    2.2  05/06/2013    1.9  04/20/2013    1.8  04/18/2013    2.2  04/13/2013    STUDIES:  No results found.   ASSESSMENT: 67 y.o. [place] man with a history of well-differentiated lymphocytic lymphoma/ chronic lymphoid leukemia initially diagnosed in 2000, not requiring intervention until 2006; with multiple chromosomal abnormalities.  His treatment history is as follows:  (1) fludarabine/cyclophosphamide/rituximab x5 completed May 2007.   (2) rituximab for 8 doses October 2010, with partial response   (3) Leustatin and ofatumumab weekly x8 July to September 2011 followed by maintenance ofatumumab maintenance ofatumumab every 2 months, with initial response but rising counts September 2012   (4) status-post unrelated donor stem-cell transplant 02/24/2012 at the [facility]  (a) conditioning regimen consisted of fludarabine + TBI at 200 cGy,  followed by rituximab x27;  (b) CMV reactivation x3 (patient CMV positive, donor negative), s/p ganciclovir treatment; 3d reactivation August 2013, s/p gancyclovir, with negative PCR mid-September 2013; last gancyclovir dose 10/06/2012 (c) Chronic GVHD: involving gut and skin, treated with steroids, tacrolimus and MMF.  MMF was eventually d/c'd and tacrolimus currently at a dose of 1.5mg  BID (d) atrial fibrillation: resolved on brief amiodarone regimen (e) steroid-induced myopathy: improving  (f) hypomagnesemia: improved after d/c gancyclovir (g) hypogammaglobulinemia: s/p IVIG most recently 01/07/2013. (h) history of elevated triglycerides (606 on 07/14/2012)  (i) adrenal insufficiency: on prednisone and budesonide (j) pancytopenia, improved  (5) restaging studies September 2013  including CT scans, flow cytometry, and bone marrow biopsy, showed no evidence of residual chronic lymphoid  leukemia.  (6) recurrent GVHD (skin rash, mouth changes, severe diarrhea and gastric/duodenal/colonic biopsies 11/17/2012 c/w GVHD grade 2) now only remaining sign is bothersome diarrhea; c diff negative x3; continuing current regimen   (7) severe malnutrition -- on VITAL supplement in addition to regular diet; on Marinol for anorexia  (8) testosterone deficiency--on patch   (9) deconditioning: ongoing REHAB   (10) mild dehydration: encouraged increased po fluids; receives IVF support w magnesium three times weekly  (11) steroid-induced osteoporosis with compression fractures: received pamidronate 12/18/2012   (12) nausea: improved on current meds  (13)  Positive c.diff, 03/08/2013, on Flagyl 500 mg TID x 20 days, then on oral vanco with Questran, showing improvement  (14) hyperkalemia, improving  (15)  Hypertension, on labetalol, cardizem, and furosemide  (16) steroid induced hyperglycemia, on sterlix and 70/30 insulin  PLAN: Kellen continues to show improvement physically.  He is still physically deconditioned, however, I think a neither consult with physical therapy at this point would be a great idea. Previously, he was so very weak that he was somewhat limited in his ability to exercise. At this point, he can likely do more, and get further with therapy. He would like a referral accordingly to  [facility], who he has used in the past.  Otherwise, I am making no change to his current regimen. She'll continue on all of his current medications, and I am refilling his hydrocodone/APAP, furosemide, and tacrolimus. He will continue to receive IV fluids with magnesium on a weekly basis, and we will see him every other week for physical exam. There has also been some discussion on whether or not to initiate zoledronic acid, and currently this is pending some dental procedures. We will continue to evaluate this plan.  He'll continue on his current dose of prednisone, 10 mg in the  morning and 2.5 mg in the evening. We will continue on this dose for approximately 2 more weeks, then will likely drop the evening dose and take only 10 mg in the morning. We'll continue a very slow taper, staying at each dose for 4-6 weeks before making changes.   [facility] has requested a followup bone marrow biopsy, some additional blood work, and CT scans of the chest, abdomen, pelvis, and neck. This is routine followup now that Stone is coming up on his 1.5 year anniversary of his transplant next month. The bone marrow biopsy has been scheduled for 08/15/2013, and hopefully they will be able to schedule the CT scans on August 27, just before I see him again on August 28..   All this was reviewed in detail today with Aurther Loft and [place], both of whom voice understanding and agreement. They will call with any changes or problems.   Marland Kitchen  Jasaiah Karwowski    08/04/2013

## 2013-08-04 NOTE — Telephone Encounter (Signed)
appts made and printed. PT scheduled for 08/12/13 @ 9:30am...td

## 2013-08-04 NOTE — Patient Instructions (Addendum)
Hypomagnesemia Magnesium is a common ion (mineral) in the body which is needed for metabolism. It is about how the body handles food and other chemical reactions necessary for life. Only about 2% of the magnesium in our body is found in the blood. When this is low, it is called hypomagnesemia. The blood will measure only a tiny amount of the magnesium in our body. When it is low in our blood, it does not mean that the whole body supply is low. The normal serum concentration ranges from 1.8-2.5 mEq/L. When the level gets to be less than 1.0 mEq/L, a number of problems begin to happen.  CAUSES   Receiving intravenous fluids without magnesium replacement.  Loss of magnesium from the bowel by naso-gastric suction.  Loss of magnesium from nausea and vomiting or severe diarrhea. Any of the inflammatory bowel conditions can cause this.  Abuse of alcohol often leads to low serum magnesium.  An inherited form of magnesium loss happens when the kidneys lose magnesium. This is called familial or primary hypomagnesemia.  Some medications such as diuretics also cause the loss of magnesium. SYMPTOMS  These following problems are worse if the changes in magnesium levels come on suddenly.  Tremor.  Confusion.  Muscle weakness.  Over-sensitive to sights and sounds.  Sensitive reflexes.  Depression.  Muscular fibrillations.  Over-reactivity of the nerves.  Irritability.  Psychosis.  Spasms of the hand muscles.  Tetany (where the muscles go into uncontrollable spasms). DIAGNOSIS  This condition can be diagnosed by blood tests. TREATMENT   In emergency, magnesium can be given intravenously (by vein).  If the condition is less worrisome, it can be corrected by diet. High levels of magnesium are found in green leafy vegetables, peas, beans and nuts among other things. It can also be given through medications by mouth.  If it is being caused by medications, changes can be made.  If  alcohol is a problem, help is available if there are difficulties giving it up. Document Released: 09/03/2005 Document Revised: 03/01/2012 Document Reviewed: 07/28/2008 ExitCare Patient Information 2014 [employer].        

## 2013-08-08 ENCOUNTER — Encounter: Payer: Self-pay | Admitting: Physician Assistant

## 2013-08-08 ENCOUNTER — Encounter (HOSPITAL_COMMUNITY): Payer: Self-pay | Admitting: Pharmacy Technician

## 2013-08-08 NOTE — Progress Notes (Signed)
I have reviewed Timothy [name]'s case with Dr. Darnelle Catalan. At this time, we are holding off on zoledronic acid pending upcoming dental procedures. Once these have been completed, Dr. Darnelle Catalan would like to redo receive zoledronic acid. Clinically, he has a diagnosis of osteoporosis secondary to his multiple compression fractures, despite an unremarkable bone density test back in February.  Zollie Scale, PA-C 08/08/2013

## 2013-08-11 ENCOUNTER — Other Ambulatory Visit (HOSPITAL_BASED_OUTPATIENT_CLINIC_OR_DEPARTMENT_OTHER): Payer: BC Managed Care – PPO | Admitting: Lab

## 2013-08-11 ENCOUNTER — Ambulatory Visit (HOSPITAL_BASED_OUTPATIENT_CLINIC_OR_DEPARTMENT_OTHER): Payer: BC Managed Care – PPO

## 2013-08-11 ENCOUNTER — Other Ambulatory Visit: Payer: Self-pay | Admitting: Radiology

## 2013-08-11 DIAGNOSIS — D809 Immunodeficiency with predominantly antibody defects, unspecified: Secondary | ICD-10-CM

## 2013-08-11 DIAGNOSIS — C9110 Chronic lymphocytic leukemia of B-cell type not having achieved remission: Secondary | ICD-10-CM

## 2013-08-11 DIAGNOSIS — D89811 Chronic graft-versus-host disease: Secondary | ICD-10-CM

## 2013-08-11 DIAGNOSIS — Z9489 Other transplanted organ and tissue status: Secondary | ICD-10-CM

## 2013-08-11 LAB — CBC WITH DIFFERENTIAL/PLATELET
BASO%: 0.1 % (ref 0.0–2.0)
Basophils Absolute: 0 10*3/uL (ref 0.0–0.1)
EOS%: 0.4 % (ref 0.0–7.0)
MCH: 31.5 pg (ref 27.2–33.4)
MCHC: 33.2 g/dL (ref 32.0–36.0)
MCV: 94.7 fL (ref 79.3–98.0)
MONO%: 6.6 % (ref 0.0–14.0)
RBC: 3.78 10*6/uL — ABNORMAL LOW (ref 4.20–5.82)
RDW: 14.9 % — ABNORMAL HIGH (ref 11.0–14.6)
lymph#: 2 10*3/uL (ref 0.9–3.3)
nRBC: 0 % (ref 0–0)

## 2013-08-11 LAB — COMPREHENSIVE METABOLIC PANEL (CC13)
Albumin: 3.3 g/dL — ABNORMAL LOW (ref 3.5–5.0)
Alkaline Phosphatase: 75 U/L (ref 40–150)
BUN: 41.3 mg/dL — ABNORMAL HIGH (ref 7.0–26.0)
Creatinine: 0.9 mg/dL (ref 0.7–1.3)
Glucose: 100 mg/dl (ref 70–140)
Potassium: 5.4 mEq/L — ABNORMAL HIGH (ref 3.5–5.1)

## 2013-08-11 LAB — IGG, IGA, IGM
IgA: <7 mg/dL — ABNORMAL LOW (ref 68–379)
IgG (Immunoglobin G), Serum: 608 mg/dL — ABNORMAL LOW (ref 650–1600)

## 2013-08-11 LAB — LACTATE DEHYDROGENASE (CC13): LDH: 257 U/L — ABNORMAL HIGH (ref 125–245)

## 2013-08-11 MED ORDER — SODIUM CHLORIDE 0.9 % IJ SOLN
10.0000 mL | Freq: Once | INTRAMUSCULAR | Status: DC
  Filled 2013-08-11: qty 10

## 2013-08-11 MED ORDER — SODIUM CHLORIDE 0.9 % IV SOLN
Freq: Once | INTRAVENOUS | Status: AC
  Administered 2013-08-11: 12:00:00 via INTRAVENOUS
  Filled 2013-08-11: qty 500

## 2013-08-11 MED ORDER — SODIUM CHLORIDE 0.9 % IV SOLN
Freq: Once | INTRAVENOUS | Status: AC
  Administered 2013-08-11: 12:00:00 via INTRAVENOUS

## 2013-08-11 MED ORDER — HEPARIN SOD (PORK) LOCK FLUSH 100 UNIT/ML IV SOLN
500.0000 [IU] | Freq: Once | INTRAVENOUS | Status: AC
  Administered 2013-08-11: 250 [IU] via INTRAVENOUS
  Filled 2013-08-11: qty 5

## 2013-08-11 NOTE — Patient Instructions (Addendum)
Magnesium This is a blood test which measures the amount of magnesium in your blood. Most of the magnesium in your body exists in your cells. However, the magnesium in your blood is important for many processes. It is important for your nerves to be able to conduct electrical energy. This is important in heart patients with higher heartbeats. When your heart beats and then gets ready to beat again, it repolarizes. If the magnesium levels are low or high, it can affect the accuracy of the repolarization process. The magnesium levels are also monitored during pregnancy. This is done to determine if the expectant mother may have preeclampsia or toxemia. Magnesium is often used for treatment of these problems. PREPARATION FOR TEST No preparation or fasting is needed. A blood sample may be taken by inserting a needle into a vein in the arm.  NORMAL FINDINGS  Adult: 1.3 to 2.1 mEq/L or 0.65 to 1.05 mmol/L (SI units)  Child: 1.4 to 1.7 mEq/L  Newborn: 1.4 to 2 mEq/L Possible critical values: less than 0.5 mEq/L or greater than 3 mEq/L Ranges for normal findings may vary among different laboratories and hospitals. You should always check with your caregiver after having lab work or other tests done to discuss the meaning of your test results and whether your values are considered within normal limits. MEANING OF TEST  Your caregiver will go over the test results with you. Your caregiver will discuss the importance and meaning of your results. He or she will also discuss treatment options and additional tests, if needed. OBTAINING THE TEST RESULTS It is your responsibility to obtain your test results. Ask the lab or department performing the test when and how you will get your results. Document Released: 01/10/2005 Document Revised: 03/01/2012 Document Reviewed: 11/18/2008 [employer] Patient Information 2014 [employer].  

## 2013-08-15 ENCOUNTER — Ambulatory Visit (HOSPITAL_COMMUNITY)
Admission: RE | Admit: 2013-08-15 | Discharge: 2013-08-15 | Disposition: A | Discharge status: Home / Self Care | Payer: BC Managed Care – PPO | Source: Ambulatory Visit | Attending: Physician Assistant | Admitting: Physician Assistant

## 2013-08-15 ENCOUNTER — Ambulatory Visit (HOSPITAL_COMMUNITY): Admission: RE | Admit: 2013-08-15 | Payer: BC Managed Care – PPO | Source: Ambulatory Visit

## 2013-08-15 ENCOUNTER — Other Ambulatory Visit (HOSPITAL_COMMUNITY): Payer: Self-pay | Admitting: Family Medicine

## 2013-08-15 DIAGNOSIS — D809 Immunodeficiency with predominantly antibody defects, unspecified: Secondary | ICD-10-CM

## 2013-08-15 DIAGNOSIS — C9110 Chronic lymphocytic leukemia of B-cell type not having achieved remission: Secondary | ICD-10-CM

## 2013-08-15 DIAGNOSIS — Z9489 Other transplanted organ and tissue status: Secondary | ICD-10-CM

## 2013-08-15 DIAGNOSIS — D89811 Chronic graft-versus-host disease: Secondary | ICD-10-CM

## 2013-08-15 MED ORDER — SODIUM CHLORIDE 0.9 % IV SOLN: INTRAVENOUS | Status: DC

## 2013-08-15 NOTE — Progress Notes (Signed)
Patient ID: Timothy [name], male   DOB: 11-Nov-1946, 67 y.o.   MRN: [id number] Pt presented today for CT guided bone marrow biopsy. However, pt had breakfast this am and therefore will be rescheduled for 8/28 secondary to need for IV conscious sedation. Pt/wife given prep instructions for bx.

## 2013-08-16 ENCOUNTER — Other Ambulatory Visit: Payer: Self-pay | Admitting: Radiology

## 2013-08-17 ENCOUNTER — Ambulatory Visit (HOSPITAL_COMMUNITY)
Admission: RE | Admit: 2013-08-17 | Discharge: 2013-08-17 | Disposition: A | Discharge status: Home / Self Care | Payer: BC Managed Care – PPO | Source: Ambulatory Visit | Attending: Physician Assistant | Admitting: Physician Assistant

## 2013-08-17 ENCOUNTER — Encounter (HOSPITAL_COMMUNITY): Payer: Self-pay

## 2013-08-17 DIAGNOSIS — K439 Ventral hernia without obstruction or gangrene: Secondary | ICD-10-CM | POA: Insufficient documentation

## 2013-08-17 DIAGNOSIS — K828 Other specified diseases of gallbladder: Secondary | ICD-10-CM | POA: Insufficient documentation

## 2013-08-17 DIAGNOSIS — I700 Atherosclerosis of aorta: Secondary | ICD-10-CM | POA: Insufficient documentation

## 2013-08-17 DIAGNOSIS — K8020 Calculus of gallbladder without cholecystitis without obstruction: Secondary | ICD-10-CM | POA: Insufficient documentation

## 2013-08-17 DIAGNOSIS — M8448XA Pathological fracture, other site, initial encounter for fracture: Secondary | ICD-10-CM | POA: Insufficient documentation

## 2013-08-17 DIAGNOSIS — K838 Other specified diseases of biliary tract: Secondary | ICD-10-CM | POA: Insufficient documentation

## 2013-08-17 DIAGNOSIS — K5730 Diverticulosis of large intestine without perforation or abscess without bleeding: Secondary | ICD-10-CM | POA: Insufficient documentation

## 2013-08-17 DIAGNOSIS — M899 Disorder of bone, unspecified: Secondary | ICD-10-CM | POA: Insufficient documentation

## 2013-08-17 DIAGNOSIS — C9110 Chronic lymphocytic leukemia of B-cell type not having achieved remission: Secondary | ICD-10-CM | POA: Insufficient documentation

## 2013-08-17 DIAGNOSIS — I771 Stricture of artery: Secondary | ICD-10-CM | POA: Insufficient documentation

## 2013-08-17 DIAGNOSIS — Z9481 Bone marrow transplant status: Secondary | ICD-10-CM | POA: Insufficient documentation

## 2013-08-17 DIAGNOSIS — I2510 Atherosclerotic heart disease of native coronary artery without angina pectoris: Secondary | ICD-10-CM | POA: Insufficient documentation

## 2013-08-17 MED ORDER — IOHEXOL 300 MG/ML  SOLN
125.0000 mL | Freq: Once | INTRAMUSCULAR | Status: AC | PRN
  Administered 2013-08-17: 125 mL via INTRAVENOUS

## 2013-08-18 ENCOUNTER — Ambulatory Visit (HOSPITAL_COMMUNITY)
Admission: RE | Admit: 2013-08-18 | Discharge: 2013-08-18 | Disposition: A | Discharge status: Home / Self Care | Payer: BC Managed Care – PPO | Source: Ambulatory Visit | Attending: Physician Assistant | Admitting: Physician Assistant

## 2013-08-18 ENCOUNTER — Ambulatory Visit (HOSPITAL_BASED_OUTPATIENT_CLINIC_OR_DEPARTMENT_OTHER): Payer: BC Managed Care – PPO

## 2013-08-18 ENCOUNTER — Ambulatory Visit (HOSPITAL_BASED_OUTPATIENT_CLINIC_OR_DEPARTMENT_OTHER): Payer: BC Managed Care – PPO | Admitting: Physician Assistant

## 2013-08-18 ENCOUNTER — Telehealth: Payer: Self-pay | Admitting: *Deleted

## 2013-08-18 ENCOUNTER — Other Ambulatory Visit (HOSPITAL_BASED_OUTPATIENT_CLINIC_OR_DEPARTMENT_OTHER): Payer: BC Managed Care – PPO | Admitting: Lab

## 2013-08-18 ENCOUNTER — Encounter (HOSPITAL_COMMUNITY): Payer: Self-pay

## 2013-08-18 ENCOUNTER — Encounter: Payer: Self-pay | Admitting: Physician Assistant

## 2013-08-18 VITALS — BP 115/69 | HR 76 | Temp 98.2°F | Resp 20 | Ht 67.0 in | Wt 179.3 lb

## 2013-08-18 VITALS — BP 135/69 | HR 75 | Temp 97.7°F | Resp 18

## 2013-08-18 DIAGNOSIS — R21 Rash and other nonspecific skin eruption: Secondary | ICD-10-CM | POA: Insufficient documentation

## 2013-08-18 DIAGNOSIS — M7989 Other specified soft tissue disorders: Secondary | ICD-10-CM

## 2013-08-18 DIAGNOSIS — K839 Disease of biliary tract, unspecified: Secondary | ICD-10-CM

## 2013-08-18 DIAGNOSIS — Z9489 Other transplanted organ and tissue status: Secondary | ICD-10-CM

## 2013-08-18 DIAGNOSIS — A0472 Enterocolitis due to Clostridium difficile, not specified as recurrent: Secondary | ICD-10-CM

## 2013-08-18 DIAGNOSIS — M549 Dorsalgia, unspecified: Secondary | ICD-10-CM

## 2013-08-18 DIAGNOSIS — Z794 Long term (current) use of insulin: Secondary | ICD-10-CM | POA: Insufficient documentation

## 2013-08-18 DIAGNOSIS — D809 Immunodeficiency with predominantly antibody defects, unspecified: Secondary | ICD-10-CM

## 2013-08-18 DIAGNOSIS — I10 Essential (primary) hypertension: Secondary | ICD-10-CM | POA: Insufficient documentation

## 2013-08-18 DIAGNOSIS — E139 Other specified diabetes mellitus without complications: Secondary | ICD-10-CM | POA: Insufficient documentation

## 2013-08-18 DIAGNOSIS — R768 Other specified abnormal immunological findings in serum: Secondary | ICD-10-CM

## 2013-08-18 DIAGNOSIS — R29898 Other symptoms and signs involving the musculoskeletal system: Secondary | ICD-10-CM | POA: Insufficient documentation

## 2013-08-18 DIAGNOSIS — K8070 Calculus of gallbladder and bile duct without cholecystitis without obstruction: Secondary | ICD-10-CM

## 2013-08-18 DIAGNOSIS — R197 Diarrhea, unspecified: Secondary | ICD-10-CM | POA: Insufficient documentation

## 2013-08-18 DIAGNOSIS — D89811 Chronic graft-versus-host disease: Secondary | ICD-10-CM | POA: Insufficient documentation

## 2013-08-18 DIAGNOSIS — R894 Abnormal immunological findings in specimens from other organs, systems and tissues: Secondary | ICD-10-CM

## 2013-08-18 DIAGNOSIS — E099 Drug or chemical induced diabetes mellitus without complications: Secondary | ICD-10-CM

## 2013-08-18 DIAGNOSIS — C9110 Chronic lymphocytic leukemia of B-cell type not having achieved remission: Secondary | ICD-10-CM

## 2013-08-18 DIAGNOSIS — G8929 Other chronic pain: Secondary | ICD-10-CM

## 2013-08-18 DIAGNOSIS — D696 Thrombocytopenia, unspecified: Secondary | ICD-10-CM | POA: Insufficient documentation

## 2013-08-18 DIAGNOSIS — Z79899 Other long term (current) drug therapy: Secondary | ICD-10-CM | POA: Insufficient documentation

## 2013-08-18 DIAGNOSIS — Y832 Surgical operation with anastomosis, bypass or graft as the cause of abnormal reaction of the patient, or of later complication, without mention of misadventure at the time of the procedure: Secondary | ICD-10-CM | POA: Insufficient documentation

## 2013-08-18 DIAGNOSIS — R0602 Shortness of breath: Secondary | ICD-10-CM

## 2013-08-18 DIAGNOSIS — D801 Nonfamilial hypogammaglobulinemia: Secondary | ICD-10-CM

## 2013-08-18 DIAGNOSIS — D649 Anemia, unspecified: Secondary | ICD-10-CM | POA: Insufficient documentation

## 2013-08-18 DIAGNOSIS — T8600 Unspecified complication of bone marrow transplant: Secondary | ICD-10-CM | POA: Insufficient documentation

## 2013-08-18 DIAGNOSIS — R5381 Other malaise: Secondary | ICD-10-CM

## 2013-08-18 LAB — COMPREHENSIVE METABOLIC PANEL (CC13)
ALT: 32 U/L (ref 0–55)
AST: 35 U/L — ABNORMAL HIGH (ref 5–34)
Albumin: 3.1 g/dL — ABNORMAL LOW (ref 3.5–5.0)
Alkaline Phosphatase: 72 U/L (ref 40–150)
Calcium: 8.5 mg/dL (ref 8.4–10.4)
Chloride: 102 mEq/L (ref 98–109)
Potassium: 4.6 mEq/L (ref 3.5–5.1)

## 2013-08-18 LAB — CBC
MCH: 31.1 pg (ref 26.0–34.0)
MCV: 93.9 fL (ref 78.0–100.0)
Platelets: 112 10*3/uL — ABNORMAL LOW (ref 150–400)
RDW: 14.6 % (ref 11.5–15.5)

## 2013-08-18 LAB — CBC WITH DIFFERENTIAL/PLATELET
Basophils Absolute: 0 10*3/uL (ref 0.0–0.1)
Eosinophils Absolute: 0 10*3/uL (ref 0.0–0.5)
HGB: 11.3 g/dL — ABNORMAL LOW (ref 13.0–17.1)
MONO#: 0.2 10*3/uL (ref 0.1–0.9)
NEUT#: 5.3 10*3/uL (ref 1.5–6.5)
RDW: 14.8 % — ABNORMAL HIGH (ref 11.0–14.6)
WBC: 7.4 10*3/uL (ref 4.0–10.3)
lymph#: 1.9 10*3/uL (ref 0.9–3.3)
nRBC: 0 % (ref 0–0)

## 2013-08-18 LAB — BONE MARROW EXAM

## 2013-08-18 MED ORDER — HEPARIN SOD (PORK) LOCK FLUSH 100 UNIT/ML IV SOLN
INTRAVENOUS | Status: AC
  Filled 2013-08-18: qty 5

## 2013-08-18 MED ORDER — SODIUM CHLORIDE 0.9 % IV SOLN
Freq: Once | INTRAVENOUS | Status: AC
  Administered 2013-08-18: 13:00:00 via INTRAVENOUS
  Filled 2013-08-18: qty 500

## 2013-08-18 MED ORDER — SODIUM CHLORIDE 0.9 % IV SOLN
INTRAVENOUS | Status: DC
  Administered 2013-08-18: 08:00:00 via INTRAVENOUS

## 2013-08-18 MED ORDER — BUDESONIDE 3 MG PO CP24: 3.0000 mg | ORAL_CAPSULE | Freq: Three times a day (TID) | ORAL | Status: DC

## 2013-08-18 MED ORDER — FENTANYL CITRATE 0.05 MG/ML IJ SOLN
INTRAMUSCULAR | Status: AC
  Filled 2013-08-18: qty 6

## 2013-08-18 MED ORDER — HEPARIN SOD (PORK) LOCK FLUSH 100 UNIT/ML IV SOLN
500.0000 [IU] | Freq: Once | INTRAVENOUS | Status: AC
  Administered 2013-08-18: 250 [IU] via INTRAVENOUS
  Filled 2013-08-18: qty 5

## 2013-08-18 MED ORDER — HYDROCODONE-ACETAMINOPHEN 5-325 MG PO TABS
1.0000 | ORAL_TABLET | ORAL | Status: DC | PRN
  Filled 2013-08-18: qty 2

## 2013-08-18 MED ORDER — SODIUM CHLORIDE 0.9 % IV SOLN
Freq: Once | INTRAVENOUS | Status: AC
  Administered 2013-08-18: 13:00:00 via INTRAVENOUS

## 2013-08-18 MED ORDER — FENTANYL CITRATE 0.05 MG/ML IJ SOLN
INTRAMUSCULAR | Status: AC | PRN
  Administered 2013-08-18: 50 ug via INTRAVENOUS

## 2013-08-18 MED ORDER — BUDESONIDE 3 MG PO CP24: 6.0000 mg | ORAL_CAPSULE | Freq: Three times a day (TID) | ORAL | Status: DC

## 2013-08-18 MED ORDER — HEPARIN SOD (PORK) LOCK FLUSH 100 UNIT/ML IV SOLN
250.0000 [IU] | INTRAVENOUS | Status: AC | PRN
  Administered 2013-08-18: 10:00:00

## 2013-08-18 MED ORDER — MIDAZOLAM HCL 2 MG/2ML IJ SOLN
INTRAMUSCULAR | Status: AC
  Filled 2013-08-18: qty 6

## 2013-08-18 MED ORDER — HEPARIN SOD (PORK) LOCK FLUSH 100 UNIT/ML IV SOLN
500.0000 [IU] | Freq: Once | INTRAVENOUS | Status: DC
Start: 2013-08-18 — End: 2013-08-19
  Filled 2013-08-18: qty 5

## 2013-08-18 MED ORDER — MIDAZOLAM HCL 2 MG/2ML IJ SOLN
INTRAMUSCULAR | Status: AC | PRN
  Administered 2013-08-18: 1 mg via INTRAVENOUS

## 2013-08-18 MED ORDER — SODIUM CHLORIDE 0.9 % IJ SOLN
10.0000 mL | INTRAMUSCULAR | Status: DC | PRN
  Administered 2013-08-18: 10 mL via INTRAVENOUS
  Filled 2013-08-18: qty 10

## 2013-08-18 NOTE — Telephone Encounter (Signed)
appts made and printed. Pt is aware that cs will call for his [name]. Pt is aware that his tx will be added. i emailed MW to add the tx...td

## 2013-08-18 NOTE — Procedures (Signed)
CT guided bone marrow biopsy.  No immediate complication.   

## 2013-08-18 NOTE — Telephone Encounter (Signed)
Per staff message and POF I have scheduled appts.  JMW  

## 2013-08-18 NOTE — H&P (Signed)
Chief Complaint: "I am here for a follow-up bone marrow biopsy after my transplant." Referring Physician: Dr. [name] HPI: Timothy [name] is an 67 y.o. male with PMHx of CLL s/p allogeneic stem-cell transplant at the [facility] 02/24/2012. Patient also with history of GVHD and CKD. He states he is here today for follow-up bone marrow biopsy. He denies any chest pain or shortness of breath. He denies any blood in his stool or urine. He denies any recent illness, fever or chills. His only complaint is bilateral leg weakness that has been chronic in nature.   Past Medical History:  Past Medical History  Diagnosis Date  . 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 Diverticular disease   . Hyperlipidemia   . Obesity   . Hypertension   . Hiatal hernia   . CMV (cytomegalovirus) antibody positive     pre-transplant, with seroconversion x2 pst-transplant  . Right bundle branch block     pre-transplant  . CKD (chronic kidney disease) stage 2, GFR 60-89 ml/min   . Pancytopenia   . Steroid-induced diabetes   . Atrial fibrillation     post-transplant  . Myopathy   . 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 Leukemia, chronic lymphoid   . 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 Diarrhea in adult patient 12/05/2012    Due to active GVHD  . CLL (chronic lymphocytic leukemia) 12/05/2012    Dx 07/1999; started Rx 12/06  AlloBMT 3/13  . Rash of face 12/05/2012    Due to GVHD  . Hypomagnesemia 01/26/2013    Past Surgical History:  Past Surgical History  Procedure Laterality Date  . Tonsillectomy and adenoidectomy    . Bone marrow transplant    . Flexible sigmoidoscopy  11/17/2012    Procedure: FLEXIBLE SIGMOIDOSCOPY;  Surgeon: Petra Kuba, MD;  Location: [facility];  Service: Endoscopy;  Laterality: N/A;  Dr Ewing Schlein states will be admitted to rooom 1339 11/16/12  .  Esophagogastroduodenoscopy  11/17/2012    Procedure: ESOPHAGOGASTRODUODENOSCOPY (EGD);  Surgeon: Petra Kuba, MD;  Location: [facility];  Service: Endoscopy;  Laterality: N/A;    Family History:  Family History  Problem Relation Age of Onset  . Cancer Father     Social History:  reports that he has never smoked. He has never used smokeless tobacco. He reports that he does not drink alcohol or use illicit drugs.  Allergies:  Allergies  Allergen Reactions  . Benadryl [Diphenhydramine Hcl]     "Restless leg syndrome"      Medication List    ASK your doctor about these medications       acyclovir 400 MG tablet  Commonly known as:  ZOVIRAX  Take 800 mg by mouth 2 (two) times daily.     ANDRODERM 4 MG/24HR Pt24 patch  Generic drug:  testosterone  Place 1 patch onto the skin daily.     budesonide 3 MG 24 hr capsule  Commonly known as:  ENTOCORT EC  Take 6 mg by mouth 3 (three) times daily.     calcium citrate-vitamin D 315-200 MG-UNIT per tablet  Commonly known as:  CITRACAL+D  Take 1 tablet by mouth 2 (two) times daily.     cholecalciferol 1000 UNITS tablet  Commonly known as:  VITAMIN D  Take 1,000 Units by mouth every evening.     cholestyramine 4 G packet  Commonly known  as:  QUESTRAN  Take 1 packet by mouth 3 (three) times daily with meals.     diltiazem 240 MG 24 hr capsule  Commonly known as:  CARDIZEM CD  Take 240 mg by mouth daily.     ferrous sulfate 325 (65 FE) MG tablet  Take 325 mg by mouth daily with breakfast.     fluconazole 100 MG tablet  Commonly known as:  DIFLUCAN  Take 100 mg by mouth daily.     furosemide 20 MG tablet  Commonly known as:  LASIX  Take 20 mg by mouth daily.     heparin flush (porcine) 100 UNIT/ML injection  300 Units by Intracatheter route as needed (to flush Hickman port).     HYDROcodone-acetaminophen 10-325 MG per tablet  Commonly known as:  NORCO  Take 1-2 tablets by mouth every 6 (six) hours as needed for pain  (breakthrough pain).     labetalol 200 MG tablet  Commonly known as:  NORMODYNE  Take 200 mg by mouth 2 (two) times daily.     Lidocaine-Hydrocortisone Ace 3-0.5 % Kit  Apply 1 application topically as needed (for pain).     lisinopril 10 MG tablet  Commonly known as:  PRINIVIL,ZESTRIL  Take 10 mg by mouth every evening.     multivitamin with minerals Tabs tablet  Take 1 tablet by mouth daily.     Normal Saline Flush 0.9 % Soln  10 mLs by Intracatheter route as needed (to flush Hickman).     NOVOLOG MIX 70/30 FLEXPEN (70-30) 100 UNIT/ML Supn  Generic drug:  Insulin Aspart Prot & Aspart  Inject 9-18 Units into the skin 2 (two) times daily. Take 18 units before breakfast and 9 units before dinner     omeprazole 20 MG capsule  Commonly known as:  PRILOSEC  Take 20 mg by mouth daily.     oxyCODONE 10 MG 12 hr tablet  Commonly known as:  OXYCONTIN  Take 1 tablet (10 mg total) by mouth every 12 (twelve) hours.     predniSONE 10 MG tablet  Commonly known as:  DELTASONE  Take 10 mg by mouth daily. Take with one-half tablet of a 5mg  to make 2.5 mg     predniSONE 5 MG tablet  Commonly known as:  DELTASONE  Take 2.5 mg by mouth daily. Take with a 10 mg tablet to make 12.5mg      PROTEIN PO  Take 6 g by mouth 3 (three) times daily.     sertraline 50 MG tablet  Commonly known as:  ZOLOFT  Take 50-100 mg by mouth as directed. Am     sulfamethoxazole-trimethoprim 800-160 MG per tablet  Commonly known as:  BACTRIM DS  Take 1 tablet by mouth daily.     tacrolimus 0.5 MG capsule  Commonly known as:  PROGRAF  Take 1.5 mg by mouth 2 (two) times daily.     vancomycin 50 mg/mL oral solution  Commonly known as:  VANCOCIN  Take 2.5 mLs (125 mg total) by mouth every 6 (six) hours.     zinc sulfate 220 MG capsule  Take 220 mg by mouth daily.        Please HPI for pertinent positives, otherwise complete 10 system ROS negative.  Physical Exam: BP 131/78  Pulse 65  Temp(Src)  97.9 F (36.6 C) (Oral)  Resp 20  SpO2 98% There is no weight on file to calculate BMI.   General Appearance:  Alert, cooperative, no distress, appears stated  age  Head:  Normocephalic, without obvious abnormality, atraumatic  Lungs:   Clear to auscultation bilaterally, no w/r/r, respirations unlabored without use of accessory muscles.  Chest Wall:  Port in place left anterior chest wall. No tenderness or deformity  Heart:  Regular rate and rhythm, S1, S2 normal, no murmur, rub or gallop.  Abdomen:   Soft, non-tender, non distended.  Extremities: Extremities normal, atraumatic, no cyanosis or edema  Pulses: 1+ and symmetric  Neurologic: Normal affect, no gross deficits.   Results for orders placed during the hospital encounter of 08/18/13 (from the past 48 hour(s))  APTT     Status: None   Collection Time    08/18/13  7:25 AM      Result Value Range   aPTT 27  24 - 37 seconds  CBC     Status: Abnormal   Collection Time    08/18/13  7:25 AM      Result Value Range   WBC 7.3  4.0 - 10.5 K/uL   RBC 3.60 (*) 4.22 - 5.81 MIL/uL   Hemoglobin 11.2 (*) 13.0 - 17.0 g/dL   HCT 45.4 (*) 09.8 - 11.9 %   MCV 93.9  78.0 - 100.0 fL   MCH 31.1  26.0 - 34.0 pg   MCHC 33.1  30.0 - 36.0 g/dL   RDW 14.7  82.9 - 56.2 %   Platelets 112 (*) 150 - 400 K/uL   Comment: SPECIMEN CHECKED FOR CLOTS     REPEATED TO VERIFY     PLATELET COUNT CONFIRMED BY SMEAR  PROTIME-INR     Status: None   Collection Time    08/18/13  7:25 AM      Result Value Range   Prothrombin Time 12.2  11.6 - 15.2 seconds   INR 0.92  0.00 - 1.49   Ct Soft Tissue Neck W Contrast  08/17/2013   *RADIOLOGY REPORT*  Clinical Data:  Chronic lymphocytic leukemia.  Status post bone marrow transplant.  CT NECK, CHEST, ABDOMEN AND PELVIS WITH CONTRAST  Technique:  Multidetector CT imaging of the neck, chest, abdomen and pelvis was performed using the standard protocol following the bolus administration of intravenous contrast.  Contrast:  OMNIPAQUE IOHEXOL 300 MG/ML  SOLN  Comparison:  09/02/2012.  CT NECK  Findings:  There are small scattered normal sized neck nodes bilaterally.  No adenopathy or mass.  The visualized portion of the brain appears normal.  Dense atherosclerotic calcifications are noted.  The skull base is unremarkable.  The paranasal sinuses and mastoid air cells are clear.  The tongue base and floor the mouth are unremarkable.  The epiglottis and periglottic fat planes are maintained.  Mass effect in the region of the right piriform sinus is due to a tortuous right common carotid artery.  The parotid and submandibular glands appear normal.  The thyroid gland is normal.  No supraclavicular adenopathy.  The bony structures are intact.  IMPRESSION: Unremarkable CT examination of the neck.  No adenopathy. There is marked tortuosity and mild ectasia of the right common carotid artery which does have mass effect on the right posterior nasopharynx.  CT CHEST  Findings: The chest wall demonstrates small scattered axillary and supraclavicular lymph nodes but no mass or adenopathy.  The thyroid gland is normal.  The bony thorax is grossly intact.  Stable numerous thoracic and lumbar compression deformities and probable changes of osseous lymphoma.  The heart is normal in size.  No pericardial effusion.  No mediastinal  or hilar mass or lymphadenopathy.  The esophagus is grossly normal.  The aorta is normal in caliber.  Mild atherosclerotic changes.  No dissection.  Scattered coronary artery calcifications are noted.  Examination of the lung parenchyma demonstrates no acute pulmonary findings.  No worrisome pulmonary lesions or metastatic pulmonary disease.  No pleural effusion.  The tracheobronchial tree is unremarkable.  IMPRESSION: No significant findings in the chest.  No adenopathy or pulmonary metastatic disease.  CT ABDOMEN AND PELVIS  Findings: The liver is unremarkable.  No focal hepatic lesions or intrahepatic biliary  dilatation.  The gallbladder is mildly distended and there appears to be mild wall thickening.  A small left hepatic lobe cyst is stable.  The spleen has decreased in size since the prior study.  It measures 10 x 10 x 11 cm.  The adrenal glands and kidneys are unremarkable.  The stomach is not well distended with contrast but no gross abnormalities are seen.  The duodenum, small bowel and colon are unremarkable and stable.  No inflammatory changes or mass lesions. Advanced diverticulosis of the sigmoid colon is again demonstrated. There is moderate to large amount of stool throughout the colon suggesting constipation.  No mesenteric or retroperitoneal mass or adenopathy.  Small scattered lymph nodes are noted.  The aorta demonstrates mild to moderate atheroma sclerotic calcifications and tortuosity.  No aneurysm or dissection.  The major branch vessels are patent.  There is a new anterior abdominal wall hernia near the umbilicus containing fat.  Stable lumbar compression fractures and vertebral augmentation is. No new lesions.  The bony pelvis is intact. Stable right iliac bone lesion.  IMPRESSION:  1.  Distended gallbladder with gallbladder wall thickening, gallstones and new common bile duct dilatation.  Recommend correlation with liver function studies.  Abdominal ultrasound may be helpful for further evaluation and to assess for acute cholecystitis. 2.  No abdominal/pelvic lymphadenopathy and no inguinal lymphadenopathy. 3.  Moderate to large amount of stool throughout the colon suggesting constipation. 4.  New anterior abdominal wall hernia near the umbilicus containing fat. 5.  Interval decrease in size of the spleen. 6.  Stable vertebral body fractures and vertebral augmentation as and stable right iliac bone lesion.   Original Report Authenticated By: Rudie Meyer, M.D.   Ct Chest W Contrast  08/17/2013   *RADIOLOGY REPORT*  Clinical Data:  Chronic lymphocytic leukemia.  Status post bone marrow  transplant.  CT NECK, CHEST, ABDOMEN AND PELVIS WITH CONTRAST  Technique:  Multidetector CT imaging of the neck, chest, abdomen and pelvis was performed using the standard protocol following the bolus administration of intravenous contrast.  Contrast: OMNIPAQUE IOHEXOL 300 MG/ML  SOLN  Comparison:  09/02/2012.  CT NECK  Findings:  There are small scattered normal sized neck nodes bilaterally.  No adenopathy or mass.  The visualized portion of the brain appears normal.  Dense atherosclerotic calcifications are noted.  The skull base is unremarkable.  The paranasal sinuses and mastoid air cells are clear.  The tongue base and floor the mouth are unremarkable.  The epiglottis and periglottic fat planes are maintained.  Mass effect in the region of the right piriform sinus is due to a tortuous right common carotid artery.  The parotid and submandibular glands appear normal.  The thyroid gland is normal.  No supraclavicular adenopathy.  The bony structures are intact.  IMPRESSION: Unremarkable CT examination of the neck.  No adenopathy. There is marked tortuosity and mild ectasia of the right common carotid artery  which does have mass effect on the right posterior nasopharynx.  CT CHEST  Findings: The chest wall demonstrates small scattered axillary and supraclavicular lymph nodes but no mass or adenopathy.  The thyroid gland is normal.  The bony thorax is grossly intact.  Stable numerous thoracic and lumbar compression deformities and probable changes of osseous lymphoma.  The heart is normal in size.  No pericardial effusion.  No mediastinal or hilar mass or lymphadenopathy.  The esophagus is grossly normal.  The aorta is normal in caliber.  Mild atherosclerotic changes.  No dissection.  Scattered coronary artery calcifications are noted.  Examination of the lung parenchyma demonstrates no acute pulmonary findings.  No worrisome pulmonary lesions or metastatic pulmonary disease.  No pleural effusion.  The  tracheobronchial tree is unremarkable.  IMPRESSION: No significant findings in the chest.  No adenopathy or pulmonary metastatic disease.  CT ABDOMEN AND PELVIS  Findings: The liver is unremarkable.  No focal hepatic lesions or intrahepatic biliary dilatation.  The gallbladder is mildly distended and there appears to be mild wall thickening.  A small left hepatic lobe cyst is stable.  The spleen has decreased in size since the prior study.  It measures 10 x 10 x 11 cm.  The adrenal glands and kidneys are unremarkable.  The stomach is not well distended with contrast but no gross abnormalities are seen.  The duodenum, small bowel and colon are unremarkable and stable.  No inflammatory changes or mass lesions. Advanced diverticulosis of the sigmoid colon is again demonstrated. There is moderate to large amount of stool throughout the colon suggesting constipation.  No mesenteric or retroperitoneal mass or adenopathy.  Small scattered lymph nodes are noted.  The aorta demonstrates mild to moderate atheroma sclerotic calcifications and tortuosity.  No aneurysm or dissection.  The major branch vessels are patent.  There is a new anterior abdominal wall hernia near the umbilicus containing fat.  Stable lumbar compression fractures and vertebral augmentation is. No new lesions.  The bony pelvis is intact. Stable right iliac bone lesion.  IMPRESSION:  1.  Distended gallbladder with gallbladder wall thickening, gallstones and new common bile duct dilatation.  Recommend correlation with liver function studies.  Abdominal ultrasound may be helpful for further evaluation and to assess for acute cholecystitis. 2.  No abdominal/pelvic lymphadenopathy and no inguinal lymphadenopathy. 3.  Moderate to large amount of stool throughout the colon suggesting constipation. 4.  New anterior abdominal wall hernia near the umbilicus containing fat. 5.  Interval decrease in size of the spleen. 6.  Stable vertebral body fractures and  vertebral augmentation as and stable right iliac bone lesion.   Original Report Authenticated By: Rudie Meyer, M.D.   Ct Abdomen Pelvis W Contrast  08/17/2013   *RADIOLOGY REPORT*  Clinical Data:  Chronic lymphocytic leukemia.  Status post bone marrow transplant.  CT NECK, CHEST, ABDOMEN AND PELVIS WITH CONTRAST  Technique:  Multidetector CT imaging of the neck, chest, abdomen and pelvis was performed using the standard protocol following the bolus administration of intravenous contrast.  Contrast: OMNIPAQUE IOHEXOL 300 MG/ML  SOLN  Comparison:  09/02/2012.  CT NECK  Findings:  There are small scattered normal sized neck nodes bilaterally.  No adenopathy or mass.  The visualized portion of the brain appears normal.  Dense atherosclerotic calcifications are noted.  The skull base is unremarkable.  The paranasal sinuses and mastoid air cells are clear.  The tongue base and floor the mouth are unremarkable.  The epiglottis and  periglottic fat planes are maintained.  Mass effect in the region of the right piriform sinus is due to a tortuous right common carotid artery.  The parotid and submandibular glands appear normal.  The thyroid gland is normal.  No supraclavicular adenopathy.  The bony structures are intact.  IMPRESSION: Unremarkable CT examination of the neck.  No adenopathy. There is marked tortuosity and mild ectasia of the right common carotid artery which does have mass effect on the right posterior nasopharynx.  CT CHEST  Findings: The chest wall demonstrates small scattered axillary and supraclavicular lymph nodes but no mass or adenopathy.  The thyroid gland is normal.  The bony thorax is grossly intact.  Stable numerous thoracic and lumbar compression deformities and probable changes of osseous lymphoma.  The heart is normal in size.  No pericardial effusion.  No mediastinal or hilar mass or lymphadenopathy.  The esophagus is grossly normal.  The aorta is normal in caliber.  Mild atherosclerotic  changes.  No dissection.  Scattered coronary artery calcifications are noted.  Examination of the lung parenchyma demonstrates no acute pulmonary findings.  No worrisome pulmonary lesions or metastatic pulmonary disease.  No pleural effusion.  The tracheobronchial tree is unremarkable.  IMPRESSION: No significant findings in the chest.  No adenopathy or pulmonary metastatic disease.  CT ABDOMEN AND PELVIS  Findings: The liver is unremarkable.  No focal hepatic lesions or intrahepatic biliary dilatation.  The gallbladder is mildly distended and there appears to be mild wall thickening.  A small left hepatic lobe cyst is stable.  The spleen has decreased in size since the prior study.  It measures 10 x 10 x 11 cm.  The adrenal glands and kidneys are unremarkable.  The stomach is not well distended with contrast but no gross abnormalities are seen.  The duodenum, small bowel and colon are unremarkable and stable.  No inflammatory changes or mass lesions. Advanced diverticulosis of the sigmoid colon is again demonstrated. There is moderate to large amount of stool throughout the colon suggesting constipation.  No mesenteric or retroperitoneal mass or adenopathy.  Small scattered lymph nodes are noted.  The aorta demonstrates mild to moderate atheroma sclerotic calcifications and tortuosity.  No aneurysm or dissection.  The major branch vessels are patent.  There is a new anterior abdominal wall hernia near the umbilicus containing fat.  Stable lumbar compression fractures and vertebral augmentation is. No new lesions.  The bony pelvis is intact. Stable right iliac bone lesion.  IMPRESSION:  1.  Distended gallbladder with gallbladder wall thickening, gallstones and new common bile duct dilatation.  Recommend correlation with liver function studies.  Abdominal ultrasound may be helpful for further evaluation and to assess for acute cholecystitis. 2.  No abdominal/pelvic lymphadenopathy and no inguinal lymphadenopathy. 3.   Moderate to large amount of stool throughout the colon suggesting constipation. 4.  New anterior abdominal wall hernia near the umbilicus containing fat. 5.  Interval decrease in size of the spleen. 6.  Stable vertebral body fractures and vertebral augmentation as and stable right iliac bone lesion.   Original Report Authenticated By: Rudie Meyer, M.D.    Assessment/Plan CLL s/p allogeneic stem cell transplant 02/2012. History of GVHD Patient scheduled today for follow-up CT guided bone marrow biopsy. Labs reviewed, patient NPO. Risks and Benefits discussed with the patient. All of the patient's questions were answered, patient is agreeable to proceed. Consent signed and in chart.    Pattricia Boss D PA-C 08/18/2013, 8:04 AM

## 2013-08-18 NOTE — Patient Instructions (Addendum)
Hypomagnesemia Magnesium is a common ion (mineral) in the body which is needed for metabolism. It is about how the body handles food and other chemical reactions necessary for life. Only about 2% of the magnesium in our body is found in the blood. When this is low, it is called hypomagnesemia. The blood will measure only a tiny amount of the magnesium in our body. When it is low in our blood, it does not mean that the whole body supply is low. The normal serum concentration ranges from 1.8-2.5 mEq/L. When the level gets to be less than 1.0 mEq/L, a number of problems begin to happen.  CAUSES   Receiving intravenous fluids without magnesium replacement.  Loss of magnesium from the bowel by naso-gastric suction.  Loss of magnesium from nausea and vomiting or severe diarrhea. Any of the inflammatory bowel conditions can cause this.  Abuse of alcohol often leads to low serum magnesium.  An inherited form of magnesium loss happens when the kidneys lose magnesium. This is called familial or primary hypomagnesemia.  Some medications such as diuretics also cause the loss of magnesium. SYMPTOMS  These following problems are worse if the changes in magnesium levels come on suddenly.  Tremor.  Confusion.  Muscle weakness.  Over-sensitive to sights and sounds.  Sensitive reflexes.  Depression.  Muscular fibrillations.  Over-reactivity of the nerves.  Irritability.  Psychosis.  Spasms of the hand muscles.  Tetany (where the muscles go into uncontrollable spasms). DIAGNOSIS  This condition can be diagnosed by blood tests. TREATMENT   In emergency, magnesium can be given intravenously (by vein).  If the condition is less worrisome, it can be corrected by diet. High levels of magnesium are found in green leafy vegetables, peas, beans and nuts among other things. It can also be given through medications by mouth.  If it is being caused by medications, changes can be made.  If  alcohol is a problem, help is available if there are difficulties giving it up. Document Released: 09/03/2005 Document Revised: 03/01/2012 Document Reviewed: 07/28/2008 ExitCare Patient Information 2014 [employer].        

## 2013-08-18 NOTE — Progress Notes (Signed)
ID: Timothy [name]   DOB: 11-26-1946  MR#: [id number]  NWG#:[id number]  VHQ:IONG,E DOUGLAS, MD SU: OTHER MD: Donzetta Starch, Romero Belling  HISTORY OF PRESENT ILLNESS: We have very complete records from Dr. Sydnee Levans in [place], and in summary:  The patient was initially diagnosed in August 2000, with a white cell count of 23,600, but normal hemoglobin and platelets, and no significant symptomatology. Over the next several years his white cell count drifted up, and he eventually developed some symptoms of night sweats in particular, leading to treatment with fludarabine, Cytoxan and rituxan for five cycles given between December 2006 and May 2007.  We have CT scans from June 2006, November 2006 and April 2007, and comparing the November 2006 and April 2007 scans, there was near complete response. He had subsequent therapy in [place] as detailed below, but with decreased response, leading to allogeneic stem-cell transplant at the [facility] 02/24/2012.  INTERVAL HISTORY: Timothy [name] returns with his wife Timothy [name]  for followup of his CLL, status post allogeneic stem cell transplant in [place] in March 2013.   Timothy [name] is stable, and continues to receive IV fluids here on weekly basis. He has started physical therapy to rebuild his physical strength and he does find this helpful. He continues to have some back pain despite his recent kyphoplasty, but notes that the pain has improved since the procedure. His pain is well-controlled with one dose of OxyContin daily, and an average of 2 tablets of hydrocodone/APAP daily.  Interval history is remarkable for Doctor having had restaging CTs and a repeat bone marrow biopsy this week. The CT results are all described below, and we are awaiting the results of the bone marrow biopsy which was obtained this morning.  REVIEW OF SYSTEMS: Timothy [name] has had no fevers, chills, or night sweats. He continues to bruise very easily but has had no abnormal bleeding.  He denies any new rashes or skin changes. His appetite is good and has had no nausea or emesis. He is having regular bowel movements, formed, and often large. He denies any additional problems with diarrhea, and continues on vancomycin as before , and has Questran to use as needed.  He's had no cough, phlegm production, or chest pain. His shortness of breath with exertion is improving. He has occasional palpitations but no chest pain. He denies abnormal headaches or dizziness.  He continues to have a resting tremor which is stable. He denies any numbness or tingling in the extremities. He has chronic swelling in the lower extremities which he feels  is improving.   A detailed review of systems is otherwise stable and noncontributory.  PAST MEDICAL HISTORY: Past Medical History  Diagnosis Date  . 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 Diverticular disease   . Hyperlipidemia   . Obesity   . Hypertension   . Hiatal hernia   . CMV (cytomegalovirus) antibody positive     pre-transplant, with seroconversion x2 pst-transplant  . Right bundle branch block     pre-transplant  . CKD (chronic kidney disease) stage 2, GFR 60-89 ml/min   . Pancytopenia   . Steroid-induced diabetes   . Atrial fibrillation     post-transplant  . Myopathy   . 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 Leukemia, chronic lymphoid   . 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 Diarrhea in adult patient 12/05/2012    Due to  active GVHD  . CLL (chronic lymphocytic leukemia) 12/05/2012    Dx 07/1999; started Rx 12/06  AlloBMT 3/13  . Rash of face 12/05/2012    Due to GVHD  . Hypomagnesemia 01/26/2013    PAST SURGICAL HISTORY: Past Surgical History  Procedure Laterality Date  . Tonsillectomy and adenoidectomy    . Bone marrow transplant    . Flexible sigmoidoscopy  11/17/2012    Procedure: FLEXIBLE SIGMOIDOSCOPY;   Surgeon: Petra Kuba, MD;  Location: [facility];  Service: Endoscopy;  Laterality: N/A;  Dr Ewing Schlein states will be admitted to rooom 1339 11/16/12  . Esophagogastroduodenoscopy  11/17/2012    Procedure: ESOPHAGOGASTRODUODENOSCOPY (EGD);  Surgeon: Petra Kuba, MD;  Location: [facility];  Service: Endoscopy;  Laterality: N/A;    FAMILY HISTORY Family History  Problem Relation Age of Onset  . Cancer Father    The patient's father died from complications of chronic lymphocytic leukemia at the age of 77.  It had been diagnosed seven years before when he was 51.  The patient is enrolled in a familial chronic lymphocytic leukemia study out of the [employer].  The patient's mother is 43, alive, unfortunately suffering with dementia, and he has a brother, 66, who is otherwise in fair health.   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  His wife of >40 years, Timothy [name], is a homemaker.  Their daughter, Timothy [name], lives in [place].  She also is a Futures trader.  The patient has an 40 year old grandson and an 60-year-old granddaughter, and that is really the main reason he moved to this area.  He is a International aid/development worker.     ADVANCED DIRECTIVES: In place  HEALTH MAINTENANCE: History  Substance Use Topics  . Smoking status: Never Smoker   . Smokeless tobacco: Never Used  . Alcohol Use: No     Colonoscopy:  PSA:  Bone density:  Lipid panel:  Allergies  Allergen Reactions  . Benadryl [Diphenhydramine Hcl]     "Restless leg syndrome"    Current Outpatient Prescriptions  Medication Sig Dispense Refill  . acyclovir (ZOVIRAX) 400 MG tablet Take 800 mg by mouth 2 (two) times daily.      . budesonide (ENTOCORT EC) 3 MG 24 hr capsule Take 1 capsule (3 mg total) by mouth 3 (three) times daily.  90 capsule  5  . calcium citrate-vitamin D (CITRACAL+D) 315-200 MG-UNIT per tablet  Take 1 tablet by mouth 2 (two) times daily.      . cholecalciferol (VITAMIN D) 1000 UNITS tablet Take 1,000 Units by mouth every evening.       . cholestyramine (QUESTRAN) 4 G packet Take 1 packet by mouth 3 (three) times daily with meals.      Marland Kitchen diltiazem (CARDIZEM CD) 240 MG 24 hr capsule Take 240 mg by mouth daily.      . ferrous sulfate 325 (65 FE) MG tablet Take 325 mg by mouth daily with breakfast.      . fluconazole (DIFLUCAN) 100 MG tablet Take 100 mg by mouth daily.      . furosemide (LASIX) 20 MG tablet Take 20 mg by mouth daily.      . Heparin Lock Flush (HEPARIN FLUSH, PORCINE,) 100 UNIT/ML injection 300 Units by Intracatheter route as needed (to flush Hickman port).       Marland Kitchen HYDROcodone-acetaminophen (NORCO) 10-325 MG per tablet Take 1-2 tablets by  mouth every 6 (six) hours as needed for pain (breakthrough pain).  60 tablet  0  . Insulin Aspart Prot & Aspart (NOVOLOG MIX 70/30 FLEXPEN) (70-30) 100 UNIT/ML SUPN Inject 9-18 Units into the skin 2 (two) times daily. Take 18 units before breakfast and 9 units before dinner      . labetalol (NORMODYNE) 200 MG tablet Take 200 mg by mouth 2 (two) times daily.      . Lidocaine-Hydrocortisone Ace 3-0.5 % KIT Apply 1 application topically as needed (for pain).       Marland Kitchen lisinopril (PRINIVIL,ZESTRIL) 10 MG tablet Take 10 mg by mouth every evening.      . Multiple Vitamin (MULTIVITAMIN WITH MINERALS) TABS tablet Take 1 tablet by mouth daily.      Marland Kitchen omeprazole (PRILOSEC) 20 MG capsule Take 20 mg by mouth daily.      Marland Kitchen oxyCODONE (OXYCONTIN) 10 MG 12 hr tablet Take 1 tablet (10 mg total) by mouth every 12 (twelve) hours.  60 tablet  0  . predniSONE (DELTASONE) 10 MG tablet Take 10 mg by mouth daily. Take with one-half tablet of a 5mg  to make 2.5 mg      . predniSONE (DELTASONE) 5 MG tablet Take 2.5 mg by mouth daily. Take with a 10 mg tablet to make 12.5mg       . PROTEIN PO Take 6 g by mouth 3 (three) times daily.      . sertraline (ZOLOFT) 50 MG tablet  Take 50-100 mg by mouth as directed. Am      . Sodium Chloride Flush (NORMAL SALINE FLUSH) 0.9 % SOLN 10 mLs by Intracatheter route as needed (to flush Hickman).       Marland Kitchen sulfamethoxazole-trimethoprim (BACTRIM DS) 800-160 MG per tablet Take 1 tablet by mouth daily.      . tacrolimus (PROGRAF) 0.5 MG capsule Take 1.5 mg by mouth 2 (two) times daily.      Marland Kitchen testosterone (ANDRODERM) 4 MG/24HR PT24 patch Place 1 patch onto the skin daily.      . vancomycin (VANCOCIN) 50 mg/mL oral solution Take 2.5 mLs (125 mg total) by mouth every 6 (six) hours.  140 mL  1  . zinc sulfate 220 MG capsule Take 220 mg by mouth daily.      . [DISCONTINUED] insulin aspart (NOVOLOG FLEXPEN) 100 UNIT/ML SOPN FlexPen 18units sq qam, 9units sq qpm, or as directed  15 mL  1   No current facility-administered medications for this visit.   Facility-Administered Medications Ordered in Other Visits  Medication Dose Route Frequency Provider Last Rate Last Dose  . 0.9 %  sodium chloride infusion   Intravenous Continuous Arney Mayabb G Bridgit Eynon, PA-C 500 mL/hr at 03/12/13 0900    . 0.9 %  sodium chloride infusion   Intravenous Continuous D Jeananne Rama, PA-C 20 mL/hr at 08/18/13 0731    . fentaNYL (SUBLIMAZE) 0.05 MG/ML injection           . heparin lock flush 100 UNIT/ML injection           . heparin lock flush 100 unit/mL  500 Units Intravenous Once Abundio Miu, MD      . HYDROcodone-acetaminophen (NORCO/VICODIN) 5-325 MG per tablet 1-2 tablet  1-2 tablet Oral Q4H PRN Abundio Miu, MD      . midazolam (VERSED) 2 MG/2ML injection           . sodium chloride 0.9 % injection 10 mL  10 mL Intravenous PRN Lowella Dell,  MD   10 mL at 08/11/12 1606  . sodium chloride 0.9 % injection 10 mL  10 mL Intravenous PRN Catalina Gravel, PA-C   10 mL at 08/18/13 1444    OBJECTIVE: Middle-aged white male who appears comfortable and is in no acute distress  Filed Vitals:   08/18/13 1104  BP: 115/69  Pulse: 76  Temp: 98.2 F (36.8 C)  Resp: 20      Body mass index is 28.08 kg/(m^2).   Filed Weights   08/18/13 1104  Weight: 179 lb 4.8 oz (81.33 kg)  ECOG FS: 1  Face shows no rash Sclerae unicteric Oropharynx is clear, no ulcerations noted Lungs clear to auscultation with good excursion bilaterally, no rales or rhonchi, no wheezes Heart regular rate and rhythm, no murmur appreciated Abdomen  soft, nontender to palpation, positive bowel sounds. With flexing of the abdominal muscles, there is a palpable umbilical hernia. There is no pain to palpation. Bilateral lower extremity edema, stable,  1+ and symmetrical Neuro: nonfocal, well oriented, positive affect MSK: No focal spinal tenderness to palpation.     LAB RESULTS:  CBC    Component Value Date/Time   WBC 7.4 08/18/2013 1025   WBC 7.3 08/18/2013 0725   RBC 3.62* 08/18/2013 1025   RBC 3.60* 08/18/2013 0725   RBC 3.82* 03/16/2013 1400   HGB 11.3* 08/18/2013 1025   HGB 11.2* 08/18/2013 0725   HCT 34.3* 08/18/2013 1025   HCT 33.8* 08/18/2013 0725   PLT 110* 08/18/2013 1025   PLT 112* 08/18/2013 0725   MCV 94.8 08/18/2013 1025   MCV 93.9 08/18/2013 0725   MCH 31.2 08/18/2013 1025   MCH 31.1 08/18/2013 0725   MCHC 32.9 08/18/2013 1025   MCHC 33.1 08/18/2013 0725   RDW 14.8* 08/18/2013 1025   RDW 14.6 08/18/2013 0725   LYMPHSABS 1.9 08/18/2013 1025   LYMPHSABS 1.4 03/18/2013 0615   MONOABS 0.2 08/18/2013 1025   MONOABS 0.3 03/18/2013 0615   EOSABS 0.0 08/18/2013 1025   EOSABS 0.0 03/18/2013 0615   BASOSABS 0.0 08/18/2013 1025   BASOSABS 0.0 03/18/2013 0615        Chemistry      Component Value Date/Time   NA 132* 08/18/2013 1029   NA 131* 06/14/2013 0800   K 4.6 08/18/2013 1029   K 4.7 06/14/2013 0800   CL 101 06/15/2013 1034   CL 97 06/14/2013 0800   CO2 22 08/18/2013 1029   CO2 24 06/14/2013 0800   BUN 34.1* 08/18/2013 1029   BUN 38* 06/14/2013 0800   CREATININE 0.8 08/18/2013 1029   CREATININE 0.97 06/14/2013 0800      Component Value Date/Time   CALCIUM 8.5 08/18/2013 1029    CALCIUM 9.1 06/14/2013 0800   ALKPHOS 72 08/18/2013 1029   ALKPHOS 75 04/18/2013 1218   AST 35* 08/18/2013 1029   AST 24 04/18/2013 1218   ALT 32 08/18/2013 1029   ALT 33 04/18/2013 1218   BILITOT 0.27 08/18/2013 1029   BILITOT 0.2* 04/18/2013 1218     Magnesium  1.6  08/11/2013    1.7  08/04/2013    1.5  07/21/2013    1.7  07/06/2013    1.6  06/30/2013    1.9  06/06/2013    1.8  05/26/2013    1.9  05/23/2013    2.2  05/06/2013    1.9  04/20/2013    1.8  04/18/2013    2.2  04/13/2013    STUDIES:  Ct Abdomen Pelvis W Contrast  08/17/2013   *RADIOLOGY REPORT*  Clinical Data:  Chronic lymphocytic leukemia.  Status post bone marrow transplant.  CT NECK, CHEST, ABDOMEN AND PELVIS WITH CONTRAST  Technique:  Multidetector CT imaging of the neck, chest, abdomen and pelvis was performed using the standard protocol following the bolus administration of intravenous contrast.  Contrast: OMNIPAQUE IOHEXOL 300 MG/ML  SOLN  Comparison:  09/02/2012.  CT NECK  Findings:  There are small scattered normal sized neck nodes bilaterally.  No adenopathy or mass.  The visualized portion of the brain appears normal.  Dense atherosclerotic calcifications are noted.  The skull base is unremarkable.  The paranasal sinuses and mastoid air cells are clear.  The tongue base and floor the mouth are unremarkable.  The epiglottis and periglottic fat planes are maintained.  Mass effect in the region of the right piriform sinus is due to a tortuous right common carotid artery.  The parotid and submandibular glands appear normal.  The thyroid gland is normal.  No supraclavicular adenopathy.  The bony structures are intact.  IMPRESSION: Unremarkable CT examination of the neck.  No adenopathy. There is marked tortuosity and mild ectasia of the right common carotid artery which does have mass effect on the right posterior nasopharynx.  CT CHEST  Findings: The chest wall demonstrates small scattered axillary and supraclavicular  lymph nodes but no mass or adenopathy.  The thyroid gland is normal.  The bony thorax is grossly intact.  Stable numerous thoracic and lumbar compression deformities and probable changes of osseous lymphoma.  The heart is normal in size.  No pericardial effusion.  No mediastinal or hilar mass or lymphadenopathy.  The esophagus is grossly normal.  The aorta is normal in caliber.  Mild atherosclerotic changes.  No dissection.  Scattered coronary artery calcifications are noted.  Examination of the lung parenchyma demonstrates no acute pulmonary findings.  No worrisome pulmonary lesions or metastatic pulmonary disease.  No pleural effusion.  The tracheobronchial tree is unremarkable.  IMPRESSION: No significant findings in the chest.  No adenopathy or pulmonary metastatic disease.  CT ABDOMEN AND PELVIS  Findings: The liver is unremarkable.  No focal hepatic lesions or intrahepatic biliary dilatation.  The gallbladder is mildly distended and there appears to be mild wall thickening.  A small left hepatic lobe cyst is stable.  The spleen has decreased in size since the prior study.  It measures 10 x 10 x 11 cm.  The adrenal glands and kidneys are unremarkable.  The stomach is not well distended with contrast but no gross abnormalities are seen.  The duodenum, small bowel and colon are unremarkable and stable.  No inflammatory changes or mass lesions. Advanced diverticulosis of the sigmoid colon is again demonstrated. There is moderate to large amount of stool throughout the colon suggesting constipation.  No mesenteric or retroperitoneal mass or adenopathy.  Small scattered lymph nodes are noted.  The aorta demonstrates mild to moderate atheroma sclerotic calcifications and tortuosity.  No aneurysm or dissection.  The major branch vessels are patent.  There is a new anterior abdominal wall hernia near the umbilicus containing fat.  Stable lumbar compression fractures and vertebral augmentation is. No new lesions.  The  bony pelvis is intact. Stable right iliac bone lesion.  IMPRESSION:  1.  Distended gallbladder with gallbladder wall thickening, gallstones and new common bile duct dilatation.  Recommend correlation with liver function studies.  Abdominal ultrasound may be helpful for further evaluation and to assess  for acute cholecystitis. 2.  No abdominal/pelvic lymphadenopathy and no inguinal lymphadenopathy. 3.  Moderate to large amount of stool throughout the colon suggesting constipation. 4.  New anterior abdominal wall hernia near the umbilicus containing fat. 5.  Interval decrease in size of the spleen. 6.  Stable vertebral body fractures and vertebral augmentation as and stable right iliac bone lesion.   Original Report Authenticated By: Rudie Meyer, M.D.   Ct Biopsy  08/18/2013   *RADIOLOGY REPORT*  Clinical history: 68 year old history of CLL.   PROCEDURE(S): CT GUIDED BONE MARROW ASPIRATES AND BIOPSY  Physician: Rachelle Hora. Henn, MD   Medications: Versed 2 mg, Fentanyl 150 mcg.A radiology nurse monitored the patient for moderate sedation.   Procedure: The procedure was explained to the patient. The risks and benefits of the procedure were discussed and the patient's questions were addressed.  Informed consent was obtained from the patient. The patient was placed prone on CT scan. Images of the pelvis were obtained. The right side of back was prepped and draped in sterile fashion. The skin and right posterior iliac bone were anesthetized with 1% lidocaine.   11 gauge bone needle was directed into the right iliac bone with CT guidance.  Two aspirates and one core biopsy obtained.   Findings: Again noted is a lucent area/lesion involving the inferior aspect of the right iliac bone.  Needle position was confirmed within the posterior right iliac bone.  Complications: None   Impression: CT guided bone marrow aspirates and core biopsy.   Original Report Authenticated By: Richarda Overlie, M.D.     ASSESSMENT: 68 y.o. [place]  man with a history of well-differentiated lymphocytic lymphoma/ chronic lymphoid leukemia initially diagnosed in 2000, not requiring intervention until 2006; with multiple chromosomal abnormalities.  His treatment history is as follows:  (1) fludarabine/cyclophosphamide/rituximab x5 completed May 2007.   (2) rituximab for 8 doses October 2010, with partial response   (3) Leustatin and ofatumumab weekly x8 July to September 2011 followed by maintenance ofatumumab maintenance ofatumumab every 2 months, with initial response but rising counts September 2012   (4) status-post unrelated donor stem-cell transplant 02/24/2012 at the [facility]  (a) conditioning regimen consisted of fludarabine + TBI at 200 cGy, followed by rituximab x27;  (b) CMV reactivation x3 (patient CMV positive, donor negative), s/p ganciclovir treatment; 3d reactivation August 2013, s/p gancyclovir, with negative PCR mid-September 2013; last gancyclovir dose 10/06/2012 (c) Chronic GVHD: involving gut and skin, treated with steroids, tacrolimus and MMF.  MMF was eventually d/c'd and tacrolimus currently at a dose of 1.5mg  BID (d) atrial fibrillation: resolved on brief amiodarone regimen (e) steroid-induced myopathy: improving  (f) hypomagnesemia: improved after d/c gancyclovir (g) hypogammaglobulinemia: s/p IVIG most recently 01/07/2013. (h) history of elevated triglycerides (606 on 07/14/2012)  (i) adrenal insufficiency: on prednisone and budesonide (j) pancytopenia, improved  (5) restaging studies September 2013  including CT scans, flow cytometry, and bone marrow biopsy, showed no evidence of residual chronic lymphoid leukemia.  (6) recurrent GVHD (skin rash, mouth changes, severe diarrhea and gastric/duodenal/colonic biopsies 11/17/2012 c/w GVHD grade 2) now only remaining sign is bothersome diarrhea; c diff negative x3; continuing current regimen   (7) severe malnutrition -- on VITAL supplement in addition to regular diet;  on Marinol for anorexia  (8) testosterone deficiency--on patch   (9) deconditioning: ongoing REHAB   (10) mild dehydration: encouraged increased po fluids; receives IVF support w magnesium three times weekly  (11) steroid-induced osteoporosis with compression fractures: received pamidronate 12/18/2012   (12)  nausea: improved on current meds  (13)  Positive c.diff, 03/08/2013, on Flagyl 500 mg TID x 20 days, then on oral vanco with Questran, showing improvement  (14) hyperkalemia, improving  (15)  Hypertension, on labetalol, cardizem, and furosemide  (16) steroid induced hyperglycemia, on sterlix and 70/30 insulin  PLAN: Timothy [name] will receive his IV fluids with IV magnesium today as planned. I am also refilling his budesonide as well, 3 mg 3 times daily. He is ready to decrease his dose of prednisone, from 10 mg in the morning and 2.5 mg in the evening. He will drop the evening dose, and will take only 10 mg in the morning. We'll continue to taper this very slowly, and he will likely stay at this dose for at least 4 weeks.  As noted above, Timothy [name]'s CT scans looked good with the exception of a distended gallbladder with wall thickening, gallstones, and new common bile duct dilatation.an abdominal ultrasound was recommended, and this will be ordered accordingly to assess for acute cholecystitis, although at this time, tear he is asymptomatic. We have noticed, however, some slight increases in his ALT over the past several weeks, and we will continue to follow this closely.  We are still awaiting the pathology and cytology results of Timothy [name]'s bone marrow biopsy today. Once we have all these results, but will be forwarded to his oncologists in [place].  We are making no changes to Timothy [name]'s current regimen, and he will continue to receive IV fluids with magnesium on a weekly basis. We will see him approximately every other week for physical exam, but of course they know to call in the meanwhile any  changes or problems.    Zollie Scale    08/18/2013

## 2013-08-23 ENCOUNTER — Ambulatory Visit (HOSPITAL_COMMUNITY)
Admission: RE | Admit: 2013-08-23 | Discharge: 2013-08-23 | Disposition: A | Discharge status: Home / Self Care | Payer: BC Managed Care – PPO | Source: Ambulatory Visit | Attending: Physician Assistant | Admitting: Physician Assistant

## 2013-08-23 ENCOUNTER — Other Ambulatory Visit: Payer: Self-pay | Admitting: Physician Assistant

## 2013-08-23 DIAGNOSIS — K7689 Other specified diseases of liver: Secondary | ICD-10-CM | POA: Insufficient documentation

## 2013-08-23 DIAGNOSIS — K839 Disease of biliary tract, unspecified: Secondary | ICD-10-CM

## 2013-08-23 DIAGNOSIS — K8020 Calculus of gallbladder without cholecystitis without obstruction: Secondary | ICD-10-CM | POA: Insufficient documentation

## 2013-08-23 DIAGNOSIS — K828 Other specified diseases of gallbladder: Secondary | ICD-10-CM | POA: Insufficient documentation

## 2013-08-23 DIAGNOSIS — R9389 Abnormal findings on diagnostic imaging of other specified body structures: Secondary | ICD-10-CM | POA: Insufficient documentation

## 2013-08-25 ENCOUNTER — Other Ambulatory Visit (HOSPITAL_BASED_OUTPATIENT_CLINIC_OR_DEPARTMENT_OTHER): Payer: BC Managed Care – PPO | Admitting: Lab

## 2013-08-25 ENCOUNTER — Ambulatory Visit (HOSPITAL_BASED_OUTPATIENT_CLINIC_OR_DEPARTMENT_OTHER): Payer: BC Managed Care – PPO

## 2013-08-25 ENCOUNTER — Encounter: Payer: Self-pay | Admitting: Oncology

## 2013-08-25 ENCOUNTER — Other Ambulatory Visit: Payer: Self-pay | Admitting: Oncology

## 2013-08-25 DIAGNOSIS — C9110 Chronic lymphocytic leukemia of B-cell type not having achieved remission: Secondary | ICD-10-CM

## 2013-08-25 DIAGNOSIS — D809 Immunodeficiency with predominantly antibody defects, unspecified: Secondary | ICD-10-CM

## 2013-08-25 LAB — COMPREHENSIVE METABOLIC PANEL (CC13)
ALT: 41 U/L (ref 0–55)
BUN: 33.9 mg/dL — ABNORMAL HIGH (ref 7.0–26.0)
CO2: 26 mEq/L (ref 22–29)
Calcium: 8.8 mg/dL (ref 8.4–10.4)
Chloride: 103 mEq/L (ref 98–109)
Creatinine: 0.9 mg/dL (ref 0.7–1.3)
Total Bilirubin: 0.3 mg/dL (ref 0.20–1.20)

## 2013-08-25 LAB — CBC WITH DIFFERENTIAL/PLATELET
BASO%: 0.3 % (ref 0.0–2.0)
Basophils Absolute: 0 10*3/uL (ref 0.0–0.1)
EOS%: 0.2 % (ref 0.0–7.0)
HCT: 33.8 % — ABNORMAL LOW (ref 38.4–49.9)
MCH: 31.5 pg (ref 27.2–33.4)
MCHC: 33.4 g/dL (ref 32.0–36.0)
MCV: 94.2 fL (ref 79.3–98.0)
MONO%: 10 % (ref 0.0–14.0)
NEUT%: 51.3 % (ref 39.0–75.0)
RDW: 14.9 % — ABNORMAL HIGH (ref 11.0–14.6)
lymph#: 2.3 10*3/uL (ref 0.9–3.3)

## 2013-08-25 LAB — IGG, IGA, IGM: IgM, Serum: <4 mg/dL — ABNORMAL LOW (ref 41–251)

## 2013-08-25 LAB — MAGNESIUM (CC13): Magnesium: 1.7 mg/dl (ref 1.5–2.5)

## 2013-08-25 MED ORDER — SODIUM CHLORIDE 0.9 % IV SOLN
Freq: Once | INTRAVENOUS | Status: AC
  Administered 2013-08-25: 12:00:00 via INTRAVENOUS
  Filled 2013-08-25: qty 500

## 2013-08-25 MED ORDER — SODIUM CHLORIDE 0.9 % IV SOLN
INTRAVENOUS | Status: DC
  Administered 2013-08-25: 12:00:00 via INTRAVENOUS

## 2013-08-25 NOTE — Progress Notes (Signed)
Faxed clinical to [facility] [phone] ref #[id number] to renew his IVIG [id number]. I spoke to [place] [phone].

## 2013-08-25 NOTE — Progress Notes (Signed)
Pt. Arrived with wife for Magnesium infusion. Feeling well as stated. " My humor is better".  Pt. Requested to speak to Dr. Darnelle Catalan about scan and BMBX biopsy results today. Message left for MD.  HL

## 2013-08-25 NOTE — Patient Instructions (Addendum)
[facility] Discharge Instructions for Patients Receiving Magnesium infusion  Today you received the following Magnesium infusion.  To help prevent nausea and vomiting after your treatment, we encourage you to take your nausea medication per Dr. Darnelle Catalan. If you develop nausea and vomiting that is not controlled by your nausea medication, call the clinic.   BELOW ARE SYMPTOMS THAT SHOULD BE REPORTED IMMEDIATELY:  *FEVER GREATER THAN 100.5 F  *CHILLS WITH OR WITHOUT FEVER  NAUSEA AND VOMITING THAT IS NOT CONTROLLED WITH YOUR NAUSEA MEDICATION  *UNUSUAL SHORTNESS OF BREATH  *UNUSUAL BRUISING OR BLEEDING  TENDERNESS IN MOUTH AND THROAT WITH OR WITHOUT PRESENCE OF ULCERS  *URINARY PROBLEMS  *BOWEL PROBLEMS  UNUSUAL RASH Items with * indicate a potential emergency and should be followed up as soon as possible.  Feel free to call the clinic you have any questions or concerns. The clinic phone number is [phone].

## 2013-08-26 ENCOUNTER — Other Ambulatory Visit: Payer: Self-pay | Admitting: *Deleted

## 2013-08-26 LAB — CHROMOSOME ANALYSIS, BONE MARROW

## 2013-09-01 ENCOUNTER — Other Ambulatory Visit (HOSPITAL_BASED_OUTPATIENT_CLINIC_OR_DEPARTMENT_OTHER): Payer: BC Managed Care – PPO | Admitting: Lab

## 2013-09-01 ENCOUNTER — Telehealth: Payer: Self-pay | Admitting: *Deleted

## 2013-09-01 ENCOUNTER — Encounter: Payer: Self-pay | Admitting: Physician Assistant

## 2013-09-01 ENCOUNTER — Ambulatory Visit (HOSPITAL_BASED_OUTPATIENT_CLINIC_OR_DEPARTMENT_OTHER): Payer: BC Managed Care – PPO

## 2013-09-01 ENCOUNTER — Telehealth: Payer: Self-pay | Admitting: Oncology

## 2013-09-01 ENCOUNTER — Ambulatory Visit (HOSPITAL_BASED_OUTPATIENT_CLINIC_OR_DEPARTMENT_OTHER): Payer: BC Managed Care – PPO | Admitting: Physician Assistant

## 2013-09-01 VITALS — BP 127/75 | HR 79 | Temp 98.3°F | Resp 18 | Ht 67.0 in | Wt 179.3 lb

## 2013-09-01 DIAGNOSIS — M549 Dorsalgia, unspecified: Secondary | ICD-10-CM

## 2013-09-01 DIAGNOSIS — D809 Immunodeficiency with predominantly antibody defects, unspecified: Secondary | ICD-10-CM

## 2013-09-01 DIAGNOSIS — R894 Abnormal immunological findings in specimens from other organs, systems and tissues: Secondary | ICD-10-CM

## 2013-09-01 DIAGNOSIS — E099 Drug or chemical induced diabetes mellitus without complications: Secondary | ICD-10-CM

## 2013-09-01 DIAGNOSIS — D849 Immunodeficiency, unspecified: Secondary | ICD-10-CM

## 2013-09-01 DIAGNOSIS — A0471 Enterocolitis due to Clostridium difficile, recurrent: Secondary | ICD-10-CM

## 2013-09-01 DIAGNOSIS — G8929 Other chronic pain: Secondary | ICD-10-CM

## 2013-09-01 DIAGNOSIS — E139 Other specified diabetes mellitus without complications: Secondary | ICD-10-CM

## 2013-09-01 DIAGNOSIS — Z9489 Other transplanted organ and tissue status: Secondary | ICD-10-CM

## 2013-09-01 DIAGNOSIS — C9110 Chronic lymphocytic leukemia of B-cell type not having achieved remission: Secondary | ICD-10-CM

## 2013-09-01 DIAGNOSIS — R768 Other specified abnormal immunological findings in serum: Secondary | ICD-10-CM

## 2013-09-01 DIAGNOSIS — T380X5A Adverse effect of glucocorticoids and synthetic analogues, initial encounter: Secondary | ICD-10-CM

## 2013-09-01 DIAGNOSIS — A0472 Enterocolitis due to Clostridium difficile, not specified as recurrent: Secondary | ICD-10-CM

## 2013-09-01 DIAGNOSIS — D89811 Chronic graft-versus-host disease: Secondary | ICD-10-CM

## 2013-09-01 LAB — CBC WITH DIFFERENTIAL/PLATELET
Basophils Absolute: 0 10*3/uL (ref 0.0–0.1)
EOS%: 0.9 % (ref 0.0–7.0)
Eosinophils Absolute: 0 10*3/uL (ref 0.0–0.5)
HCT: 35.7 % — ABNORMAL LOW (ref 38.4–49.9)
HGB: 11.8 g/dL — ABNORMAL LOW (ref 13.0–17.1)
MCH: 31.5 pg (ref 27.2–33.4)
MONO#: 0.7 10*3/uL (ref 0.1–0.9)
NEUT#: 1.9 10*3/uL (ref 1.5–6.5)
NEUT%: 42.1 % (ref 39.0–75.0)
RDW: 15.1 % — ABNORMAL HIGH (ref 11.0–14.6)
WBC: 4.5 10*3/uL (ref 4.0–10.3)
lymph#: 1.9 10*3/uL (ref 0.9–3.3)

## 2013-09-01 LAB — COMPREHENSIVE METABOLIC PANEL (CC13)
Albumin: 3.3 g/dL — ABNORMAL LOW (ref 3.5–5.0)
Alkaline Phosphatase: 77 U/L (ref 40–150)
BUN: 38 mg/dL — ABNORMAL HIGH (ref 7.0–26.0)
Calcium: 9.1 mg/dL (ref 8.4–10.4)
Chloride: 104 mEq/L (ref 98–109)
Glucose: 94 mg/dl (ref 70–140)
Potassium: 4.7 mEq/L (ref 3.5–5.1)
Sodium: 137 mEq/L (ref 136–145)
Total Protein: 5.8 g/dL — ABNORMAL LOW (ref 6.4–8.3)

## 2013-09-01 MED ORDER — SODIUM CHLORIDE 0.9 % IV SOLN
Freq: Once | INTRAVENOUS | Status: AC
  Administered 2013-09-01: 13:00:00 via INTRAVENOUS
  Filled 2013-09-01: qty 500

## 2013-09-01 MED ORDER — HYDROCODONE-ACETAMINOPHEN 10-325 MG PO TABS: 1.0000 | ORAL_TABLET | Freq: Four times a day (QID) | ORAL | Status: DC | PRN

## 2013-09-01 MED ORDER — HEPARIN SOD (PORK) LOCK FLUSH 100 UNIT/ML IV SOLN
500.0000 [IU] | Freq: Once | INTRAVENOUS | Status: AC
  Administered 2013-09-01: 250 [IU] via INTRAVENOUS
  Filled 2013-09-01: qty 5

## 2013-09-01 MED ORDER — VANCOMYCIN 50 MG/ML ORAL SOLUTION: 125.0000 mg | Freq: Four times a day (QID) | ORAL | Status: DC

## 2013-09-01 MED ORDER — SODIUM CHLORIDE 0.9 % IJ SOLN
10.0000 mL | INTRAMUSCULAR | Status: DC | PRN
  Administered 2013-09-01: 10 mL via INTRAVENOUS
  Filled 2013-09-01: qty 10

## 2013-09-01 NOTE — Progress Notes (Signed)
ID: Timothy [name] [name]   DOB: 12/11/1946  MR#: [id number]  WUJ#:[id number]  NFA:OZHY,Q DOUGLAS, MD SU: OTHER MD: Donzetta Starch, Romero Belling  HISTORY OF PRESENT ILLNESS: We have very complete records from Dr. Sydnee [name] in [place], and in summary:  The patient was initially diagnosed in August 2000, with a white cell count of 23,600, but normal hemoglobin and platelets, and no significant symptomatology. Over the next several years his white cell count drifted up, and he eventually developed some symptoms of night sweats in particular, leading to treatment with fludarabine, Cytoxan and rituxan for five cycles given between December 2006 and May 2007.  We have CT scans from June 2006, November 2006 and April 2007, and comparing the November 2006 and April 2007 scans, there was near complete response. He had subsequent therapy in [place] as detailed below, but with decreased response, leading to allogeneic stem-cell transplant at the [facility] 02/24/2012.  INTERVAL HISTORY: Timothy [name] [name] returns with his wife Timothy [name] [name]  for followup of his CLL, status post allogeneic stem cell transplant in [place] in March 2013.   Timothy [name]'s only new complaint today are signs of apparent seasonal allergies. His eyes have been itchy, his nose is runny, and his voice is hoarse. He's had no signs of fever, no chills, no cough, no increased shortness of breath. This has been going on for only a couple days. He has not been taking an antihistamine, but has Claritin on hand at home.   Otherwise, Timothy [name] [name]  is stable, and continues to receive IV fluids with magnesium here on weekly basis. He  continues to receive  physical therapy twice weekly  to rebuild his physical strength and he does find this helpful. his back pain continues to improve, and today is a 2 on a scale of 1-10. He continues with OxyContin daily, and an average of 2 tablets of hydrocodone/APAP daily with good pain control.   REVIEW OF SYSTEMS: Timothy [name] has  had no chills, or night sweats. He continues to bruise very easily, especially on the hands,  but has had no abnormal bleeding. He denies any new rashes or skin changes.  He has had no oral ulcerations or oral sensitivity.  His appetite is  still good and he  has had no nausea or emesis. He is having regular bowel movements, soft and formed, approximately 3 times daily  He denies any additional problems with diarrhea, and continues on vancomycin as before. He has not needed Questran.   He's had no chest pain, Pressure, or palpitations.  His shortness of breath with exertion is improving.  He denies abnormal headaches or dizziness.  He continues to have a resting tremor which is stable. He denies any numbness or tingling in the extremities. He has chronic swelling in the lower extremities which he feels  is stable/improving.   A detailed review of systems is otherwise stable and noncontributory.  PAST MEDICAL HISTORY: Past Medical History  Diagnosis Date  . 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 Diverticular disease   . Hyperlipidemia   . Obesity   . Hypertension   . Hiatal hernia   . CMV (cytomegalovirus) antibody positive     pre-transplant, with seroconversion x2 pst-transplant  . Right bundle branch block     pre-transplant  . CKD (chronic kidney disease) stage 2, GFR 60-89 ml/min   . Pancytopenia   . Steroid-induced diabetes   . Atrial fibrillation     post-transplant  . Myopathy   .  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 Leukemia, chronic lymphoid   . 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 Diarrhea in adult patient 12/05/2012    Due to active GVHD  . CLL (chronic lymphocytic leukemia) 12/05/2012    Dx 07/1999; started Rx 12/06  AlloBMT 3/13  . Rash of face 12/05/2012    Due to GVHD  . Hypomagnesemia 01/26/2013    PAST SURGICAL HISTORY: Past Surgical History   Procedure Laterality Date  . Tonsillectomy and adenoidectomy    . Bone marrow transplant    . Flexible sigmoidoscopy  11/17/2012    Procedure: FLEXIBLE SIGMOIDOSCOPY;  Surgeon: Petra Kuba, MD;  Location: [facility];  Service: Endoscopy;  Laterality: N/A;  Dr Ewing Schlein states will be admitted to rooom 1339 11/16/12  . Esophagogastroduodenoscopy  11/17/2012    Procedure: ESOPHAGOGASTRODUODENOSCOPY (EGD);  Surgeon: Petra Kuba, MD;  Location: [facility];  Service: Endoscopy;  Laterality: N/A;    FAMILY HISTORY Family History  Problem Relation Age of Onset  . Cancer Father    The patient's father died from complications of chronic lymphocytic leukemia at the age of 86.  It had been diagnosed seven years before when he was 30.  The patient is enrolled in a familial chronic lymphocytic leukemia study out of the [facility].  The patient's mother is 36, alive, unfortunately suffering with dementia, and he has a brother, 61, who is otherwise in fair health.   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  His wife of >40 years, Timothy [name] [name], is a homemaker.  Their daughter, Timothy [name] [name], lives in [place].  She also is a Futures trader.  The patient has an 76 year old grandson and an 13-year-old granddaughter, and that is really the main reason he moved to this area.  He is a International aid/development worker.     ADVANCED DIRECTIVES: In place  HEALTH MAINTENANCE: History  Substance Use Topics  . Smoking status: Never Smoker   . Smokeless tobacco: Never Used  . Alcohol Use: No     Colonoscopy:  PSA:  Bone density:  Lipid panel:  Allergies  Allergen Reactions  . Benadryl [Diphenhydramine Hcl]     "Restless leg syndrome"    Current Outpatient Prescriptions  Medication Sig Dispense Refill  . acyclovir (ZOVIRAX) 400 MG tablet Take 800 mg by mouth 2 (two) times daily.      . budesonide  (ENTOCORT EC) 3 MG 24 hr capsule Take 1 capsule (3 mg total) by mouth 3 (three) times daily.  90 capsule  5  . calcium citrate-vitamin D (CITRACAL+D) 315-200 MG-UNIT per tablet Take 1 tablet by mouth 2 (two) times daily.      . cholecalciferol (VITAMIN D) 1000 UNITS tablet Take 1,000 Units by mouth every evening.       . cholestyramine (QUESTRAN) 4 G packet Take 1 packet by mouth 3 (three) times daily with meals.      Marland Kitchen diltiazem (CARDIZEM CD) 240 MG 24 hr capsule Take 240 mg by mouth daily.      . ferrous sulfate 325 (65 FE) MG tablet Take 325 mg by mouth daily with breakfast.      . fluconazole (DIFLUCAN) 100 MG tablet Take 100 mg by mouth daily.      . furosemide (LASIX) 20 MG tablet Take 20 mg by mouth daily.      Marland Kitchen  Heparin Lock Flush (HEPARIN FLUSH, PORCINE,) 100 UNIT/ML injection 300 Units by Intracatheter route as needed (to flush Hickman port).       Marland Kitchen HYDROcodone-acetaminophen (NORCO) 10-325 MG per tablet Take 1-2 tablets by mouth every 6 (six) hours as needed for pain (breakthrough pain).  60 tablet  0  . Insulin Aspart Prot & Aspart (NOVOLOG MIX 70/30 FLEXPEN) (70-30) 100 UNIT/ML SUPN Inject 9-18 Units into the skin 2 (two) times daily. Take 18 units before breakfast and 9 units before dinner      . labetalol (NORMODYNE) 200 MG tablet Take 200 mg by mouth 2 (two) times daily.      . Lidocaine-Hydrocortisone Ace 3-0.5 % KIT Apply 1 application topically as needed (for pain).       Marland Kitchen lisinopril (PRINIVIL,ZESTRIL) 10 MG tablet Take 10 mg by mouth every evening.      . Multiple Vitamin (MULTIVITAMIN WITH MINERALS) TABS tablet Take 1 tablet by mouth daily.      Marland Kitchen omeprazole (PRILOSEC) 20 MG capsule Take 20 mg by mouth daily.      Marland Kitchen oxyCODONE (OXYCONTIN) 10 MG 12 hr tablet Take 1 tablet (10 mg total) by mouth every 12 (twelve) hours.  60 tablet  0  . predniSONE (DELTASONE) 10 MG tablet Take 10 mg by mouth daily. Take with one-half tablet of a 5mg  to make 2.5 mg      . predniSONE (DELTASONE) 5  MG tablet Take 2.5 mg by mouth daily. Take with a 10 mg tablet to make 12.5mg       . PROTEIN PO Take 6 g by mouth 3 (three) times daily.      . sertraline (ZOLOFT) 50 MG tablet Take 50-100 mg by mouth as directed. Am      . Sodium Chloride Flush (NORMAL SALINE FLUSH) 0.9 % SOLN 10 mLs by Intracatheter route as needed (to flush Hickman).       Marland Kitchen sulfamethoxazole-trimethoprim (BACTRIM DS) 800-160 MG per tablet Take 1 tablet by mouth daily.      . tacrolimus (PROGRAF) 0.5 MG capsule Take 1.5 mg by mouth 2 (two) times daily.      Marland Kitchen testosterone (ANDRODERM) 4 MG/24HR PT24 patch Place 1 patch onto the skin daily.      . vancomycin (VANCOCIN) 50 mg/mL oral solution Take 2.5 mLs (125 mg total) by mouth every 6 (six) hours.  140 mL  1  . zinc sulfate 220 MG capsule Take 220 mg by mouth daily.      . [DISCONTINUED] insulin aspart (NOVOLOG FLEXPEN) 100 UNIT/ML SOPN FlexPen 18units sq qam, 9units sq qpm, or as directed  15 mL  1   No current facility-administered medications for this visit.   Facility-Administered Medications Ordered in Other Visits  Medication Dose Route Frequency Provider Last Rate Last Dose  . 0.9 %  sodium chloride infusion   Intravenous Continuous Aime Meloche G Kloi Brodman, PA-C 500 mL/hr at 03/12/13 0900    . sodium chloride 0.9 % injection 10 mL  10 mL Intravenous PRN Lowella Dell, MD   10 mL at 08/11/12 1606  . sodium chloride 0.9 % injection 10 mL  10 mL Intravenous PRN Lowella Dell, MD   10 mL at 09/01/13 1354    OBJECTIVE: Middle-aged white male who appears comfortable and is in no acute distress  Filed Vitals:   09/01/13 1141  BP: 127/75  Pulse: 79  Temp: 98.3 F (36.8 C)  Resp: 18     Body mass index is  28.08 kg/(m^2).   Filed Weights   09/01/13 1141  Weight: 179 lb 4.8 oz (81.33 kg)  ECOG FS: 1  HEENT:  Face shows no rash.  Sclerae unicteric.  Evidence of small conjunctiva hemorrhage in the left eye.  Oropharynx is clear, no ulcerations noted LUNGS:   clear to  auscultation with good excursion bilaterally, no rales or rhonchi, no wheezes HEART: regular rate and rhythm, no murmur appreciated ABDOMEN:  soft, nontender to palpation, positive bowel sounds.  EXTREMITIES:  Bilateral lower extremity edema, stable,  1+ and symmetrical NEURO:  nonfocal, well oriented, positive affect MSK: No focal spinal tenderness to palpation.  SKIN:  Scattered ecchymoses on the upper extremities, including the hands    LAB RESULTS:  CBC    Component Value Date/Time   WBC 4.5 09/01/2013 1132   WBC 7.3 08/18/2013 0725   RBC 3.75* 09/01/2013 1132   RBC 3.60* 08/18/2013 0725   RBC 3.82* 03/16/2013 1400   HGB 11.8* 09/01/2013 1132   HGB 11.2* 08/18/2013 0725   HCT 35.7* 09/01/2013 1132   HCT 33.8* 08/18/2013 0725   PLT 147 09/01/2013 1132   PLT 112* 08/18/2013 0725   MCV 95.2 09/01/2013 1132   MCV 93.9 08/18/2013 0725   MCH 31.5 09/01/2013 1132   MCH 31.1 08/18/2013 0725   MCHC 33.1 09/01/2013 1132   MCHC 33.1 08/18/2013 0725   RDW 15.1* 09/01/2013 1132   RDW 14.6 08/18/2013 0725   LYMPHSABS 1.9 09/01/2013 1132   LYMPHSABS 1.4 03/18/2013 0615   MONOABS 0.7 09/01/2013 1132   MONOABS 0.3 03/18/2013 0615   EOSABS 0.0 09/01/2013 1132   EOSABS 0.0 03/18/2013 0615   BASOSABS 0.0 09/01/2013 1132   BASOSABS 0.0 03/18/2013 0615        Chemistry      Component Value Date/Time   NA 137 09/01/2013 1132   NA 131* 06/14/2013 0800   K 4.7 09/01/2013 1132   K 4.7 06/14/2013 0800   CL 101 06/15/2013 1034   CL 97 06/14/2013 0800   CO2 25 09/01/2013 1132   CO2 24 06/14/2013 0800   BUN 38.0* 09/01/2013 1132   BUN 38* 06/14/2013 0800   CREATININE 1.0 09/01/2013 1132   CREATININE 0.97 06/14/2013 0800      Component Value Date/Time   CALCIUM 9.1 09/01/2013 1132   CALCIUM 9.1 06/14/2013 0800   ALKPHOS 77 09/01/2013 1132   ALKPHOS 75 04/18/2013 1218   AST 33 09/01/2013 1132   AST 24 04/18/2013 1218   ALT 40 09/01/2013 1132   ALT 33 04/18/2013 1218   BILITOT 0.29 09/01/2013 1132   BILITOT 0.2*  04/18/2013 1218     Magnesium  1.7  08/25/2013    1.6  08/11/2013    1.7  08/04/2013    1.5  07/21/2013    1.7  07/06/2013    1.6  06/30/2013    1.9  06/06/2013    1.8  05/26/2013    1.9  05/23/2013    2.2  05/06/2013    1.9  04/20/2013    1.8  04/18/2013    2.2  04/13/2013  IgG   464  08/25/2013 IgA   <7  08/25/2013 IgM   <4  08/25/2013     STUDIES:   Ct Abdomen Pelvis W Contrast  08/17/2013   *RADIOLOGY REPORT*  Clinical Data:  Chronic lymphocytic leukemia.  Status post bone marrow transplant.  CT NECK, CHEST, ABDOMEN AND PELVIS WITH CONTRAST  Technique:  Multidetector CT imaging of  the neck, chest, abdomen and pelvis was performed using the standard protocol following the bolus administration of intravenous contrast.  Contrast: OMNIPAQUE IOHEXOL 300 MG/ML  SOLN  Comparison:  09/02/2012.  CT NECK  Findings:  There are small scattered normal sized neck nodes bilaterally.  No adenopathy or mass.  The visualized portion of the brain appears normal.  Dense atherosclerotic calcifications are noted.  The skull base is unremarkable.  The paranasal sinuses and mastoid air cells are clear.  The tongue base and floor the mouth are unremarkable.  The epiglottis and periglottic fat planes are maintained.  Mass effect in the region of the right piriform sinus is due to a tortuous right common carotid artery.  The parotid and submandibular glands appear normal.  The thyroid gland is normal.  No supraclavicular adenopathy.  The bony structures are intact.  IMPRESSION: Unremarkable CT examination of the neck.  No adenopathy. There is marked tortuosity and mild ectasia of the right common carotid artery which does have mass effect on the right posterior nasopharynx.  CT CHEST  Findings: The chest wall demonstrates small scattered axillary and supraclavicular lymph nodes but no mass or adenopathy.  The thyroid gland is normal.  The bony thorax is grossly intact.  Stable numerous thoracic and lumbar  compression deformities and probable changes of osseous lymphoma.  The heart is normal in size.  No pericardial effusion.  No mediastinal or hilar mass or lymphadenopathy.  The esophagus is grossly normal.  The aorta is normal in caliber.  Mild atherosclerotic changes.  No dissection.  Scattered coronary artery calcifications are noted.  Examination of the lung parenchyma demonstrates no acute pulmonary findings.  No worrisome pulmonary lesions or metastatic pulmonary disease.  No pleural effusion.  The tracheobronchial tree is unremarkable.  IMPRESSION: No significant findings in the chest.  No adenopathy or pulmonary metastatic disease.  CT ABDOMEN AND PELVIS  Findings: The liver is unremarkable.  No focal hepatic lesions or intrahepatic biliary dilatation.  The gallbladder is mildly distended and there appears to be mild wall thickening.  A small left hepatic lobe cyst is stable.  The spleen has decreased in size since the prior study.  It measures 10 x 10 x 11 cm.  The adrenal glands and kidneys are unremarkable.  The stomach is not well distended with contrast but no gross abnormalities are seen.  The duodenum, small bowel and colon are unremarkable and stable.  No inflammatory changes or mass lesions. Advanced diverticulosis of the sigmoid colon is again demonstrated. There is moderate to large amount of stool throughout the colon suggesting constipation.  No mesenteric or retroperitoneal mass or adenopathy.  Small scattered lymph nodes are noted.  The aorta demonstrates mild to moderate atheroma sclerotic calcifications and tortuosity.  No aneurysm or dissection.  The major branch vessels are patent.  There is a new anterior abdominal wall hernia near the umbilicus containing fat.  Stable lumbar compression fractures and vertebral augmentation is. No new lesions.  The bony pelvis is intact. Stable right iliac bone lesion.  IMPRESSION:  1.  Distended gallbladder with gallbladder wall thickening, gallstones and  new common bile duct dilatation.  Recommend correlation with liver function studies.  Abdominal ultrasound may be helpful for further evaluation and to assess for acute cholecystitis. 2.  No abdominal/pelvic lymphadenopathy and no inguinal lymphadenopathy. 3.  Moderate to large amount of stool throughout the colon suggesting constipation. 4.  New anterior abdominal wall hernia near the umbilicus containing fat. 5.  Interval  decrease in size of the spleen. 6.  Stable vertebral body fractures and vertebral augmentation as and stable right iliac bone lesion.   Original Report Authenticated By: Rudie Meyer, M.D.   Ct Biopsy  08/18/2013   *RADIOLOGY REPORT*  Clinical history: 67 year old history of CLL.   PROCEDURE(S): CT GUIDED BONE MARROW ASPIRATES AND BIOPSY  Physician: Rachelle Hora. Henn, MD   Medications: Versed 2 mg, Fentanyl 150 mcg.A radiology nurse monitored the patient for moderate sedation.   Procedure: The procedure was explained to the patient. The risks and benefits of the procedure were discussed and the patient's questions were addressed.  Informed consent was obtained from the patient. The patient was placed prone on CT scan. Images of the pelvis were obtained. The right side of back was prepped and draped in sterile fashion. The skin and right posterior iliac bone were anesthetized with 1% lidocaine.   11 gauge bone needle was directed into the right iliac bone with CT guidance.  Two aspirates and one core biopsy obtained.   Findings: Again noted is a lucent area/lesion involving the inferior aspect of the right iliac bone.  Needle position was confirmed within the posterior right iliac bone.  Complications: None   Impression: CT guided bone marrow aspirates and core biopsy.   Original Report Authenticated By: Richarda Overlie, M.D.     ASSESSMENT: 67 y.o.  man with a history of well-differentiated lymphocytic lymphoma/ chronic lymphoid leukemia initially diagnosed in 2000, not requiring  intervention until 2006; with multiple chromosomal abnormalities.  His treatment history is as follows:  (1) fludarabine/cyclophosphamide/rituximab x5 completed May 2007.   (2) rituximab for 8 doses October 2010, with partial response   (3) Leustatin and ofatumumab weekly x8 July to September 2011 followed by maintenance ofatumumab maintenance ofatumumab every 2 months, with initial response but rising counts September 2012   (4) status-post unrelated donor stem-cell transplant [facility]/2012 [facility]  (a) conditioning regimen consisted of fludarabine + TBI at 200 cGy, followed by rituximab x27;  (b) CMV reactivation x3 (patient CMV positive, donor negative), s/p ganciclovir treatment; 3d reactivation August 2013, s/p gancyclovir, with negative PCR mid-September 2013; last gancyclovir dose 10/06/2012 (c) Chronic GVHD: involving gut and skin, treated with steroids, tacrolimus and MMF.  MMF was eventually d/c'd and tacrolimus currently at a dose of 1.5mg  BID (d) atrial fibrillation: resolved on brief amiodarone regimen (e) steroid-induced myopathy: improving  (f) hypomagnesemia: improved after d/c gancyclovir (g) hypogammaglobulinemia: s/p IVIG most recently 01/07/2013. (h) history of elevated triglycerides (606 on 07/14/2012)  (i) adrenal insufficiency: on prednisone and budesonide (j) pancytopenia, improved  (5) restaging studies September 2013  including CT scans, flow cytometry, and bone marrow biopsy, showed no evidence of residual chronic lymphoid leukemia.  (6) recurrent GVHD (skin rash, mouth changes, severe diarrhea and gastric/duodenal/colonic biopsies 11/17/2012 c/w GVHD grade 2) now only remaining sign is bothersome diarrhea; c diff negative x3; continuing current regimen   (7) severe malnutrition -- on VITAL supplement in addition to regular diet; on Marinol for anorexia  (8) testosterone deficiency--on patch   (9) deconditioning: ongoing REHAB   (10) mild dehydration:  encouraged increased po fluids; receives IVF support w magnesium three times weekly  (11) steroid-induced osteoporosis with compression fractures: received pamidronate 12/18/2012   (12) nausea: improved on current meds  (13)  Positive c.diff, 03/08/2013, on Flagyl 500 mg TID x 20 days, then on oral vanco with Questran, showing improvement  (14) hyperkalemia, improving  (15)  Hypertension, on labetalol, cardizem, and furosemide  (  16) steroid induced hyperglycemia, on sterlix and 70/30 insulin  PLAN: Izekiel will receive his IV fluids with IV magnesium today as planned, and we will continue with this regimen on a weekly basis. He is scheduled for her fluids next week on September 18, and I will see him in 2 weeks for repeat physical exam and review of labs. We'll continue to follow his IgG closely since it is beginning to show evidence of decrease. He will receive IVIG when needed.   He'll continue on his current dose of prednisone, 10 mg in the morning only. We'll continue to taper the slowly, he will continue on this dose for at least 2 more weeks at which time we will reassess.   I am refilling Brach's vancomycin on which he will continue. I will also refilling his hydrocodone/APAP which he takes appropriately for chronic back pain. Hopefully, with continued physical therapy, this back pain will continue to improve. I also suggested that he try Claritin for apparent allergy symptoms, but they certainly know to call with any worsening symptoms (fever, chills, productive cough, or shortness of breath).  We reviewed [place]'s plan together, and we will continue to see him approximately every other week for physical exam, but of course they know to call in the meanwhile any changes or problems.   Zollie Scale    09/01/2013

## 2013-09-01 NOTE — Telephone Encounter (Signed)
Per staff message and POF I have scheduled appts. I tired to schedule appt for 10/2, but MD visit to late in the afternoon to have three hour treatment. Scheduler advised for new appt JMW

## 2013-09-01 NOTE — Telephone Encounter (Signed)
, °

## 2013-09-01 NOTE — Patient Instructions (Addendum)
Hypomagnesemia Magnesium is a common ion (mineral) in the body which is needed for metabolism. It is about how the body handles food and other chemical reactions necessary for life. Only about 2% of the magnesium in our body is found in the blood. When this is low, it is called hypomagnesemia. The blood will measure only a tiny amount of the magnesium in our body. When it is low in our blood, it does not mean that the whole body supply is low. The normal serum concentration ranges from 1.8-2.5 mEq/L. When the level gets to be less than 1.0 mEq/L, a number of problems begin to happen.  CAUSES   Receiving intravenous fluids without magnesium replacement.  Loss of magnesium from the bowel by naso-gastric suction.  Loss of magnesium from nausea and vomiting or severe diarrhea. Any of the inflammatory bowel conditions can cause this.  Abuse of alcohol often leads to low serum magnesium.  An inherited form of magnesium loss happens when the kidneys lose magnesium. This is called familial or primary hypomagnesemia.  Some medications such as diuretics also cause the loss of magnesium. SYMPTOMS  These following problems are worse if the changes in magnesium levels come on suddenly.  Tremor.  Confusion.  Muscle weakness.  Over-sensitive to sights and sounds.  Sensitive reflexes.  Depression.  Muscular fibrillations.  Over-reactivity of the nerves.  Irritability.  Psychosis.  Spasms of the hand muscles.  Tetany (where the muscles go into uncontrollable spasms). DIAGNOSIS  This condition can be diagnosed by blood tests. TREATMENT   In emergency, magnesium can be given intravenously (by vein).  If the condition is less worrisome, it can be corrected by diet. High levels of magnesium are found in green leafy vegetables, peas, beans and nuts among other things. It can also be given through medications by mouth.  If it is being caused by medications, changes can be made.  If  alcohol is a problem, help is available if there are difficulties giving it up. Document Released: 09/03/2005 Document Revised: 03/01/2012 Document Reviewed: 07/28/2008 ExitCare Patient Information 2014 [employer].        

## 2013-09-02 ENCOUNTER — Other Ambulatory Visit: Payer: Self-pay | Admitting: Oncology

## 2013-09-06 ENCOUNTER — Other Ambulatory Visit: Payer: Self-pay | Admitting: *Deleted

## 2013-09-07 ENCOUNTER — Telehealth: Payer: Self-pay | Admitting: Oncology

## 2013-09-08 ENCOUNTER — Other Ambulatory Visit: Payer: Self-pay | Admitting: *Deleted

## 2013-09-08 ENCOUNTER — Ambulatory Visit (HOSPITAL_BASED_OUTPATIENT_CLINIC_OR_DEPARTMENT_OTHER): Payer: BC Managed Care – PPO

## 2013-09-08 ENCOUNTER — Other Ambulatory Visit (HOSPITAL_BASED_OUTPATIENT_CLINIC_OR_DEPARTMENT_OTHER): Payer: BC Managed Care – PPO | Admitting: Lab

## 2013-09-08 DIAGNOSIS — D809 Immunodeficiency with predominantly antibody defects, unspecified: Secondary | ICD-10-CM

## 2013-09-08 DIAGNOSIS — C9110 Chronic lymphocytic leukemia of B-cell type not having achieved remission: Secondary | ICD-10-CM

## 2013-09-08 LAB — CBC WITH DIFFERENTIAL/PLATELET
EOS%: 0.5 % (ref 0.0–7.0)
Eosinophils Absolute: 0 10*3/uL (ref 0.0–0.5)
HGB: 12 g/dL — ABNORMAL LOW (ref 13.0–17.1)
MCH: 31.7 pg (ref 27.2–33.4)
MCV: 94.7 fL (ref 79.3–98.0)
MONO%: 17 % — ABNORMAL HIGH (ref 0.0–14.0)
NEUT#: 2 10*3/uL (ref 1.5–6.5)
RBC: 3.79 10*6/uL — ABNORMAL LOW (ref 4.20–5.82)
RDW: 14.6 % (ref 11.0–14.6)
lymph#: 2.5 10*3/uL (ref 0.9–3.3)
nRBC: 0 % (ref 0–0)

## 2013-09-08 LAB — COMPREHENSIVE METABOLIC PANEL (CC13)
Albumin: 3.2 g/dL — ABNORMAL LOW (ref 3.5–5.0)
Alkaline Phosphatase: 75 U/L (ref 40–150)
BUN: 41.6 mg/dL — ABNORMAL HIGH (ref 7.0–26.0)
Calcium: 9 mg/dL (ref 8.4–10.4)
Chloride: 104 mEq/L (ref 98–109)
Creatinine: 1 mg/dL (ref 0.7–1.3)
Glucose: 106 mg/dl (ref 70–140)
Potassium: 4.9 mEq/L (ref 3.5–5.1)

## 2013-09-08 LAB — IGG, IGA, IGM: IgG (Immunoglobin G), Serum: 335 mg/dL — ABNORMAL LOW (ref 650–1600)

## 2013-09-08 MED ORDER — SODIUM CHLORIDE 0.9 % IJ SOLN
10.0000 mL | INTRAMUSCULAR | Status: AC | PRN
  Administered 2013-09-08: 10 mL
  Filled 2013-09-08: qty 10

## 2013-09-08 MED ORDER — HEPARIN SOD (PORK) LOCK FLUSH 100 UNIT/ML IV SOLN
250.0000 [IU] | INTRAVENOUS | Status: AC | PRN
  Administered 2013-09-08: 250 [IU]
  Filled 2013-09-08: qty 5

## 2013-09-08 MED ORDER — SODIUM CHLORIDE 0.9 % IJ SOLN
10.0000 mL | INTRAMUSCULAR | Status: DC | PRN
  Filled 2013-09-08: qty 10

## 2013-09-08 MED ORDER — SODIUM CHLORIDE 0.9 % IV SOLN
Freq: Once | INTRAVENOUS | Status: AC
  Administered 2013-09-08: 12:00:00 via INTRAVENOUS
  Filled 2013-09-08: qty 500

## 2013-09-08 MED ORDER — SODIUM CHLORIDE 0.9 % IV SOLN
INTRAVENOUS | Status: DC
  Administered 2013-09-08: 12:00:00 via INTRAVENOUS

## 2013-09-08 MED ORDER — HEPARIN SOD (PORK) LOCK FLUSH 100 UNIT/ML IV SOLN
500.0000 [IU] | INTRAVENOUS | Status: DC | PRN
  Filled 2013-09-08: qty 5

## 2013-09-08 NOTE — Patient Instructions (Addendum)
Hypomagnesemia Magnesium is a common ion (mineral) in the body which is needed for metabolism. It is about how the body handles food and other chemical reactions necessary for life. Only about 2% of the magnesium in our body is found in the blood. When this is low, it is called hypomagnesemia. The blood will measure only a tiny amount of the magnesium in our body. When it is low in our blood, it does not mean that the whole body supply is low. The normal serum concentration ranges from 1.8-2.5 mEq/L. When the level gets to be less than 1.0 mEq/L, a number of problems begin to happen.  CAUSES   Receiving intravenous fluids without magnesium replacement.  Loss of magnesium from the bowel by naso-gastric suction.  Loss of magnesium from nausea and vomiting or severe diarrhea. Any of the inflammatory bowel conditions can cause this.  Abuse of alcohol often leads to low serum magnesium.  An inherited form of magnesium loss happens when the kidneys lose magnesium. This is called familial or primary hypomagnesemia.  Some medications such as diuretics also cause the loss of magnesium. SYMPTOMS  These following problems are worse if the changes in magnesium levels come on suddenly.  Tremor.  Confusion.  Muscle weakness.  Over-sensitive to sights and sounds.  Sensitive reflexes.  Depression.  Muscular fibrillations.  Over-reactivity of the nerves.  Irritability.  Psychosis.  Spasms of the hand muscles.  Tetany (where the muscles go into uncontrollable spasms). DIAGNOSIS  This condition can be diagnosed by blood tests. TREATMENT   In emergency, magnesium can be given intravenously (by vein).  If the condition is less worrisome, it can be corrected by diet. High levels of magnesium are found in green leafy vegetables, peas, beans and nuts among other things. It can also be given through medications by mouth.  If it is being caused by medications, changes can be made.  If  alcohol is a problem, help is available if there are difficulties giving it up. Document Released: 09/03/2005 Document Revised: 03/01/2012 Document Reviewed: 07/28/2008 ExitCare Patient Information 2014 [employer].        

## 2013-09-09 ENCOUNTER — Telehealth: Payer: Self-pay | Admitting: *Deleted

## 2013-09-09 ENCOUNTER — Other Ambulatory Visit: Payer: Self-pay | Admitting: Physician Assistant

## 2013-09-09 NOTE — Telephone Encounter (Signed)
Rec'd call from [place] with [facility]). Requesting ordered tests, CT, labs, BMBX and office notes. Faxed to [place] @ [phone] Phone [phone]

## 2013-09-13 ENCOUNTER — Telehealth: Payer: Self-pay | Admitting: Oncology

## 2013-09-13 ENCOUNTER — Telehealth: Payer: Self-pay | Admitting: *Deleted

## 2013-09-13 NOTE — Telephone Encounter (Signed)
S/w the pt's wife and she is aware of the ivig appt on 09/16/2013@10 :15am.

## 2013-09-13 NOTE — Telephone Encounter (Signed)
Per staff message and POF I have scheduled appts.  JMW  

## 2013-09-15 ENCOUNTER — Ambulatory Visit (HOSPITAL_BASED_OUTPATIENT_CLINIC_OR_DEPARTMENT_OTHER): Payer: BC Managed Care – PPO

## 2013-09-15 ENCOUNTER — Encounter: Payer: Self-pay | Admitting: Physician Assistant

## 2013-09-15 ENCOUNTER — Other Ambulatory Visit (HOSPITAL_BASED_OUTPATIENT_CLINIC_OR_DEPARTMENT_OTHER): Payer: BC Managed Care – PPO

## 2013-09-15 ENCOUNTER — Ambulatory Visit (HOSPITAL_BASED_OUTPATIENT_CLINIC_OR_DEPARTMENT_OTHER): Payer: BC Managed Care – PPO | Admitting: Physician Assistant

## 2013-09-15 ENCOUNTER — Other Ambulatory Visit (HOSPITAL_BASED_OUTPATIENT_CLINIC_OR_DEPARTMENT_OTHER): Payer: BC Managed Care – PPO | Admitting: *Deleted

## 2013-09-15 VITALS — BP 155/80 | HR 79 | Temp 98.4°F | Resp 20 | Ht 67.0 in | Wt 177.1 lb

## 2013-09-15 DIAGNOSIS — C9110 Chronic lymphocytic leukemia of B-cell type not having achieved remission: Secondary | ICD-10-CM

## 2013-09-15 DIAGNOSIS — E139 Other specified diabetes mellitus without complications: Secondary | ICD-10-CM

## 2013-09-15 DIAGNOSIS — R259 Unspecified abnormal involuntary movements: Secondary | ICD-10-CM

## 2013-09-15 DIAGNOSIS — G8929 Other chronic pain: Secondary | ICD-10-CM

## 2013-09-15 DIAGNOSIS — R894 Abnormal immunological findings in specimens from other organs, systems and tissues: Secondary | ICD-10-CM

## 2013-09-15 DIAGNOSIS — R5381 Other malaise: Secondary | ICD-10-CM

## 2013-09-15 DIAGNOSIS — R768 Other specified abnormal immunological findings in serum: Secondary | ICD-10-CM

## 2013-09-15 DIAGNOSIS — M549 Dorsalgia, unspecified: Secondary | ICD-10-CM

## 2013-09-15 DIAGNOSIS — I10 Essential (primary) hypertension: Secondary | ICD-10-CM

## 2013-09-15 DIAGNOSIS — Z9489 Other transplanted organ and tissue status: Secondary | ICD-10-CM

## 2013-09-15 DIAGNOSIS — E099 Drug or chemical induced diabetes mellitus without complications: Secondary | ICD-10-CM

## 2013-09-15 DIAGNOSIS — Z23 Encounter for immunization: Secondary | ICD-10-CM

## 2013-09-15 DIAGNOSIS — G252 Other specified forms of tremor: Secondary | ICD-10-CM

## 2013-09-15 DIAGNOSIS — D809 Immunodeficiency with predominantly antibody defects, unspecified: Secondary | ICD-10-CM

## 2013-09-15 DIAGNOSIS — T380X5A Adverse effect of glucocorticoids and synthetic analogues, initial encounter: Secondary | ICD-10-CM

## 2013-09-15 DIAGNOSIS — D849 Immunodeficiency, unspecified: Secondary | ICD-10-CM

## 2013-09-15 DIAGNOSIS — D89811 Chronic graft-versus-host disease: Secondary | ICD-10-CM

## 2013-09-15 LAB — CBC WITH DIFFERENTIAL/PLATELET
Eosinophils Absolute: 0 10*3/uL (ref 0.0–0.5)
HCT: 34.6 % — ABNORMAL LOW (ref 38.4–49.9)
HGB: 11.4 g/dL — ABNORMAL LOW (ref 13.0–17.1)
LYMPH%: 44.8 % (ref 14.0–49.0)
MONO#: 0.6 10*3/uL (ref 0.1–0.9)
NEUT#: 2.5 10*3/uL (ref 1.5–6.5)
NEUT%: 43.8 % (ref 39.0–75.0)
Platelets: 174 10*3/uL (ref 140–400)
WBC: 5.8 10*3/uL (ref 4.0–10.3)

## 2013-09-15 MED ORDER — INFLUENZA VAC SPLIT QUAD 0.5 ML IM SUSP
0.5000 mL | Freq: Once | INTRAMUSCULAR | Status: AC
  Administered 2013-09-15: 0.5 mL via INTRAMUSCULAR
  Filled 2013-09-15: qty 0.5

## 2013-09-15 MED ORDER — SODIUM CHLORIDE 0.9 % IJ SOLN
10.0000 mL | Freq: Once | INTRAMUSCULAR | Status: AC
  Administered 2013-09-15: 10 mL via INTRAVENOUS
  Filled 2013-09-15: qty 10

## 2013-09-15 MED ORDER — HEPARIN SOD (PORK) LOCK FLUSH 100 UNIT/ML IV SOLN
500.0000 [IU] | Freq: Once | INTRAVENOUS | Status: AC
  Administered 2013-09-15: 500 [IU] via INTRAVENOUS
  Filled 2013-09-15: qty 5

## 2013-09-15 MED ORDER — SODIUM CHLORIDE 0.9 % IV SOLN
Freq: Once | INTRAVENOUS | Status: AC
  Administered 2013-09-15: 13:00:00 via INTRAVENOUS
  Filled 2013-09-15: qty 500

## 2013-09-15 MED ORDER — LISINOPRIL 10 MG PO TABS: 10.0000 mg | ORAL_TABLET | Freq: Every evening | ORAL | Status: DC

## 2013-09-15 MED ORDER — ACYCLOVIR 400 MG PO TABS: 800.0000 mg | ORAL_TABLET | Freq: Two times a day (BID) | ORAL | Status: DC

## 2013-09-15 NOTE — Progress Notes (Signed)
Discharged with spouse to home, ambulatory with long ski poles in do distress.

## 2013-09-15 NOTE — Patient Instructions (Signed)
Hypomagnesemia Magnesium is a common ion (mineral) in the body which is needed for metabolism. It is about how the body handles food and other chemical reactions necessary for life. Only about 2% of the magnesium in our body is found in the blood. When this is low, it is called hypomagnesemia. The blood will measure only a tiny amount of the magnesium in our body. When it is low in our blood, it does not mean that the whole body supply is low. The normal serum concentration ranges from 1.8-2.5 mEq/L. When the level gets to be less than 1.0 mEq/L, a number of problems begin to happen.  CAUSES   Receiving intravenous fluids without magnesium replacement.  Loss of magnesium from the bowel by naso-gastric suction.  Loss of magnesium from nausea and vomiting or severe diarrhea. Any of the inflammatory bowel conditions can cause this.  Abuse of alcohol often leads to low serum magnesium.  An inherited form of magnesium loss happens when the kidneys lose magnesium. This is called familial or primary hypomagnesemia.  Some medications such as diuretics also cause the loss of magnesium. SYMPTOMS  These following problems are worse if the changes in magnesium levels come on suddenly.  Tremor.  Confusion.  Muscle weakness.  Over-sensitive to sights and sounds.  Sensitive reflexes.  Depression.  Muscular fibrillations.  Over-reactivity of the nerves.  Irritability.  Psychosis.  Spasms of the hand muscles.  Tetany (where the muscles go into uncontrollable spasms). DIAGNOSIS  This condition can be diagnosed by blood tests. TREATMENT   In emergency, magnesium can be given intravenously (by vein).  If the condition is less worrisome, it can be corrected by diet. High levels of magnesium are found in green leafy vegetables, peas, beans and nuts among other things. It can also be given through medications by mouth.  If it is being caused by medications, changes can be made.  If  alcohol is a problem, help is available if there are difficulties giving it up. Document Released: 09/03/2005 Document Revised: 03/01/2012 Document Reviewed: 07/28/2008 [employer] Patient Information 2014 [employer].        

## 2013-09-15 NOTE — Progress Notes (Signed)
ID: [name]   DOB: [birth year]  MR#: [id number]  WUJ#:[id number]  NFA:OZHY,Q DOUGLAS, MD SU: OTHER MD: Donzetta Starch, Romero Belling  CHIEF COMPLAINT:  CLL, status post allogeneic stem cell transplant   HISTORY OF PRESENT ILLNESS: We have very complete records from Dr. Sydnee Levans in [place], and in summary:  The patient was initially diagnosed in August 2000, with a white cell count of 23,600, but normal hemoglobin and platelets, and no significant symptomatology. Over the next several years his white cell count drifted up, and he eventually developed some symptoms of night sweats in particular, leading to treatment with fludarabine, Cytoxan and rituxan for five cycles given between December 2006 and May 2007.  We have CT scans from June 2006, November 2006 and April 2007, and comparing the November 2006 and April 2007 scans, there was near complete response. He had subsequent therapy in [place] as detailed below, but with decreased response, leading to allogeneic stem-cell transplant at the [facility] 02/24/2012.  INTERVAL HISTORY: Dyllon returns with his wife [name]  for followup of his CLL, status post allogeneic stem cell transplant in [place] in March 2013.   Kivon is really doing well. His labs are stable, with the exception of his IgG which has shown continuing decrease. In fact he is scheduled to receive IVIG tomorrow, September 26.  Although he continues to have some fatigue, Macklen feels a little stronger. He is still receiving physical therapy twice weekly for deconditioning. He tells me he went up the stairs at home today and it was "the easiest it has been in a very long time".   He continues to have a runny nose and some postnasal drip for which she is taking Claritin. He's had no increased cough, no phlegm production, no increased shortness of breath, no fevers or chills.   Marks's back pain is stable. As when I saw him 2 weeks ago, he describes his pain  as a 2 on a scale of 1-10. He continues with OxyContin daily, and an average of 2 tablets of hydrocodone/APAP daily with good pain control.   REVIEW OF SYSTEMS: Trever has had no chills, or night sweats. He continues to bruise very easily,   but has had no abnormal bleeding. He denies any new rashes or skin changes.  He has had no oral ulcerations or oral sensitivity.  His appetite is good. He denies any problems with nausea or emesis. He continues to have 2-3 "medium-sized" bowel movements daily and describes these as formed but soft. He continues on vancomycin but has not needed Questran. He's had no abdominal pain. He's had no change in urinary habits. He's had no chest pain, pressure, or palpitations.    He denies abnormal headaches or dizziness.  He continues to have a resting tremor which is stable. He denies any numbness or tingling in the extremities. He has chronic swelling in the lower extremities which he feels  is stable.  A detailed review of systems is otherwise stable and noncontributory.  PAST MEDICAL HISTORY: Past Medical History  Diagnosis Date  . 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 Diverticular disease   . Hyperlipidemia   . Obesity   . Hypertension   . Hiatal hernia   . CMV (cytomegalovirus) antibody positive     pre-transplant, with seroconversion x2 pst-transplant  . Right bundle branch block     pre-transplant  . CKD (chronic kidney disease) stage 2, GFR 60-89 ml/min   . Pancytopenia   .  Steroid-induced diabetes   . Atrial fibrillation     post-transplant  . Myopathy   . 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 Leukemia, chronic lymphoid   . 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 Diarrhea in adult patient 12/05/2012    Due to active GVHD  . CLL (chronic lymphocytic leukemia) 12/05/2012    Dx 07/1999; started Rx 12/06  AlloBMT 3/13  . Rash of face  12/05/2012    Due to GVHD  . Hypomagnesemia 01/26/2013    PAST SURGICAL HISTORY: Past Surgical History  Procedure Laterality Date  . Tonsillectomy and adenoidectomy    . Bone marrow transplant    . Flexible sigmoidoscopy  11/17/2012    Procedure: FLEXIBLE SIGMOIDOSCOPY;  Surgeon: Petra Kuba, MD;  Location: [facility];  Service: Endoscopy;  Laterality: N/A;  Dr Ewing Schlein states will be admitted to rooom 1339 11/16/12  . Esophagogastroduodenoscopy  11/17/2012    Procedure: ESOPHAGOGASTRODUODENOSCOPY (EGD);  Surgeon: Petra Kuba, MD;  Location: [facility];  Service: Endoscopy;  Laterality: N/A;    FAMILY HISTORY Family History  Problem Relation Age of Onset  . Cancer Father    The patient's father died from complications of chronic lymphocytic leukemia at the age of 74.  It had been diagnosed seven years before when he was 2.  The patient is enrolled in a familial chronic lymphocytic leukemia study out of the [facility].  The patient's mother is 73, alive, unfortunately suffering with dementia, and he has a brother, 51, who is otherwise in fair health.   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  His wife of >40 years, [name], is a homemaker.  Their daughter, [name], lives in [place].  She also is a Futures trader.  The patient has an 42 year old grandson and an 66-year-old granddaughter, and that is really the main reason he moved to this area.  He is a International aid/development worker.     ADVANCED DIRECTIVES: In place  HEALTH MAINTENANCE: History  Substance Use Topics  . Smoking status: Never Smoker   . Smokeless tobacco: Never Used  . Alcohol Use: No     Colonoscopy:  PSA:  Bone density:  Lipid panel:  Allergies  Allergen Reactions  . Benadryl [Diphenhydramine Hcl]     "Restless leg syndrome"    Current Outpatient Prescriptions  Medication Sig Dispense  Refill  . acyclovir (ZOVIRAX) 400 MG tablet Take 2 tablets (800 mg total) by mouth 2 (two) times daily.  120 tablet  3  . budesonide (ENTOCORT EC) 3 MG 24 hr capsule Take 1 capsule (3 mg total) by mouth 3 (three) times daily.  90 capsule  5  . calcium citrate-vitamin D (CITRACAL+D) 315-200 MG-UNIT per tablet Take 1 tablet by mouth 2 (two) times daily.      . cholecalciferol (VITAMIN D) 1000 UNITS tablet Take 1,000 Units by mouth every evening.       . cholestyramine (QUESTRAN) 4 G packet Take 1 packet by mouth 3 (three) times daily with meals.      Marland Kitchen diltiazem (CARDIZEM CD) 240 MG 24 hr capsule Take 240 mg by mouth daily.      Marland Kitchen diltiazem (CARDIZEM CD) 240 MG 24 hr capsule TAKE 1 CAPSULE BY MOUTH DAILY  30 capsule  3  . ferrous sulfate 325 (65 FE) MG  tablet Take 325 mg by mouth daily with breakfast.      . fluconazole (DIFLUCAN) 100 MG tablet Take 100 mg by mouth daily.      . furosemide (LASIX) 20 MG tablet Take 20 mg by mouth daily.      . Heparin Lock Flush (HEPARIN FLUSH, PORCINE,) 100 UNIT/ML injection 300 Units by Intracatheter route as needed (to flush Hickman port).       Marland Kitchen HYDROcodone-acetaminophen (NORCO) 10-325 MG per tablet Take 1-2 tablets by mouth every 6 (six) hours as needed for pain (breakthrough pain).  60 tablet  0  . Insulin Aspart Prot & Aspart (NOVOLOG MIX 70/30 FLEXPEN) (70-30) 100 UNIT/ML SUPN Inject 9-18 Units into the skin 2 (two) times daily. Take 18 units before breakfast and 9 units before dinner      . labetalol (NORMODYNE) 200 MG tablet Take 200 mg by mouth 2 (two) times daily.      . Lidocaine-Hydrocortisone Ace 3-0.5 % KIT Apply 1 application topically as needed (for pain).       Marland Kitchen lisinopril (PRINIVIL,ZESTRIL) 10 MG tablet Take 1 tablet (10 mg total) by mouth every evening.  30 tablet  3  . Multiple Vitamin (MULTIVITAMIN WITH MINERALS) TABS tablet Take 1 tablet by mouth daily.      Marland Kitchen omeprazole (PRILOSEC) 20 MG capsule Take 20 mg by mouth daily.      Marland Kitchen oxyCODONE  (OXYCONTIN) 10 MG 12 hr tablet Take 1 tablet (10 mg total) by mouth every 12 (twelve) hours.  60 tablet  0  . predniSONE (DELTASONE) 10 MG tablet Take 10 mg by mouth daily. Take with one-half tablet of a 5mg  to make 2.5 mg      . predniSONE (DELTASONE) 5 MG tablet Take 2.5 mg by mouth daily. Take with a 10 mg tablet to make 12.5mg       . PROTEIN PO Take 6 g by mouth 3 (three) times daily.      . sertraline (ZOLOFT) 50 MG tablet Take 50-100 mg by mouth as directed. Am      . Sodium Chloride Flush (NORMAL SALINE FLUSH) 0.9 % SOLN 10 mLs by Intracatheter route as needed (to flush Hickman).       Marland Kitchen sulfamethoxazole-trimethoprim (BACTRIM DS) 800-160 MG per tablet Take 1 tablet by mouth daily.      . tacrolimus (PROGRAF) 0.5 MG capsule Take 1.5 mg by mouth 2 (two) times daily.      Marland Kitchen testosterone (ANDRODERM) 4 MG/24HR PT24 patch Place 1 patch onto the skin daily.      . vancomycin (VANCOCIN) 50 mg/mL oral solution Take 2.5 mLs (125 mg total) by mouth every 6 (six) hours.  140 mL  1  . zinc sulfate 220 MG capsule Take 220 mg by mouth daily.      . [DISCONTINUED] insulin aspart (NOVOLOG FLEXPEN) 100 UNIT/ML SOPN FlexPen 18units sq qam, 9units sq qpm, or as directed  15 mL  1   No current facility-administered medications for this visit.   Facility-Administered Medications Ordered in Other Visits  Medication Dose Route Frequency Provider Last Rate Last Dose  . 0.9 %  sodium chloride infusion   Intravenous Continuous Denitra Donaghey G Saim Almanza, PA-C 500 mL/hr at 03/12/13 0900    . sodium chloride 0.9 % injection 10 mL  10 mL Intravenous PRN Lowella Dell, MD   10 mL at 08/11/12 1606    OBJECTIVE: Middle-aged white male who appears comfortable and is in no acute distress  Filed  Vitals:   09/15/13 1059  BP: 155/80  Pulse: 79  Temp: 98.4 F (36.9 C)  Resp: 20     Body mass index is 27.73 kg/(m^2).   Filed Weights   09/15/13 1059  Weight: 177 lb 1.6 oz (80.332 kg)  ECOG FS: 1  HEENT:  Face shows no rash.   Sclerae unicteric.  EEXTREMITIES:  Bilateral lower extremity edema, stable,  1+ and symmetrical NEURO:  nonfocal, well oriented, positive affect SKIN:  Scattered ecchymoses on the upper extremities, including the hands   Remainder of physical exam was deferred today.    LAB RESULTS:  CBC    Component Value Date/Time   WBC 5.8 09/15/2013 1047   WBC 7.3 08/18/2013 0725   RBC 3.68* 09/15/2013 1047   RBC 3.60* 08/18/2013 0725   RBC 3.82* 03/16/2013 1400   HGB 11.4* 09/15/2013 1047   HGB 11.2* 08/18/2013 0725   HCT 34.6* 09/15/2013 1047   HCT 33.8* 08/18/2013 0725   PLT 174 09/15/2013 1047   PLT 112* 08/18/2013 0725   MCV 94.0 09/15/2013 1047   MCV 93.9 08/18/2013 0725   MCH 31.0 09/15/2013 1047   MCH 31.1 08/18/2013 0725   MCHC 32.9 09/15/2013 1047   MCHC 33.1 08/18/2013 0725   RDW 14.4 09/15/2013 1047   RDW 14.6 08/18/2013 0725   LYMPHSABS 2.6 09/15/2013 1047   LYMPHSABS 1.4 03/18/2013 0615   MONOABS 0.6 09/15/2013 1047   MONOABS 0.3 03/18/2013 0615   EOSABS 0.0 09/15/2013 1047   EOSABS 0.0 03/18/2013 0615   BASOSABS 0.0 09/15/2013 1047   BASOSABS 0.0 03/18/2013 0615        Chemistry      Component Value Date/Time   NA 138 09/08/2013 1039   NA 131* 06/14/2013 0800   K 4.9 09/08/2013 1039   K 4.7 06/14/2013 0800   CL 101 06/15/2013 1034   CL 97 06/14/2013 0800   CO2 25 09/08/2013 1039   CO2 24 06/14/2013 0800   BUN 41.6* 09/08/2013 1039   BUN 38* 06/14/2013 0800   CREATININE 1.0 09/08/2013 1039   CREATININE 0.97 06/14/2013 0800      Component Value Date/Time   CALCIUM 9.0 09/08/2013 1039   CALCIUM 9.1 06/14/2013 0800   ALKPHOS 75 09/08/2013 1039   ALKPHOS 75 04/18/2013 1218   AST 28 09/08/2013 1039   AST 24 04/18/2013 1218   ALT 41 09/08/2013 1039   ALT 33 04/18/2013 1218   BILITOT 0.32 09/08/2013 1039   BILITOT 0.2* 04/18/2013 1218      Magnesium  1.6  09/08/2013    1.7  08/25/2013    1.6  08/11/2013    1.7  08/04/2013    1.5  07/21/2013    1.7  07/06/2013    1.6  06/30/2013    1.9  06/06/2013      IgG   335  09/08/2013    464  08/25/2013  IgA   <7  09/08/2013    <7  08/25/2013  IgM     7  09/18/214    <4  08/25/2013     STUDIES:  No recent studies.    ASSESSMENT: 67 y.o. [place] man with a history of well-differentiated lymphocytic lymphoma/ chronic lymphoid leukemia initially diagnosed in 2000, not requiring intervention until 2006; with multiple chromosomal abnormalities.  His treatment history is as follows:  (1) fludarabine/cyclophosphamide/rituximab x5 completed May 2007.   (2) rituximab for 8 doses October 2010, with partial response   (3) Leustatin and ofatumumab  weekly x8 July to September 2011 followed by maintenance ofatumumab maintenance ofatumumab every 2 months, with initial response but rising counts September 2012   (4) status-post unrelated donor stem-cell transplant 02/24/2012 at the [facility]  (a) conditioning regimen consisted of fludarabine + TBI at 200 cGy, followed by rituximab x27;  (b) CMV reactivation x3 (patient CMV positive, donor negative), s/p ganciclovir treatment; 3d reactivation August 2013, s/p gancyclovir, with negative PCR mid-September 2013; last gancyclovir dose 10/06/2012 (c) Chronic GVHD: involving gut and skin, treated with steroids, tacrolimus and MMF.  MMF was eventually d/c'd and tacrolimus currently at a dose of 1.5mg  BID (d) atrial fibrillation: resolved on brief amiodarone regimen (e) steroid-induced myopathy: improving  (f) hypomagnesemia: improved after d/c gancyclovir (g) hypogammaglobulinemia: s/p IVIG most recently 01/07/2013. (h) history of elevated triglycerides (606 on 07/14/2012)  (i) adrenal insufficiency: on prednisone and budesonide (j) pancytopenia, improved  (5) restaging studies September 2013  including CT scans, flow cytometry, and  bone marrow biopsy, showed no evidence of residual chronic lymphoid leukemia.  (6) recurrent GVHD (skin rash, mouth changes, severe diarrhea and gastric/duodenal/colonic biopsies 11/17/2012 c/w GVHD grade 2) now only remaining sign is bothersome diarrhea; c diff negative x3; continuing current regimen   (7) severe malnutrition -- on VITAL supplement in addition to regular diet; on Marinol for anorexia  (8) testosterone deficiency--on patch   (9) deconditioning: ongoing REHAB   (10) mild dehydration: encouraged increased po fluids; receives IVF support w magnesium three times weekly  (11) steroid-induced osteoporosis with compression fractures: received pamidronate 12/18/2012. Status post kyphoplasty at L3-4 in June 2014. Still with some chronic back pain controlled with OxyContin and hydrocodone/APAP.  (12) nausea: improved on current meds  (13)  Positive c.diff, 03/08/2013, on Flagyl 500 mg TID x 20 days, then on oral vanco with Questran, showing improvement  (14) hyperkalemia, resolved  (15)  Hypertension, on labetalol, cardizem, lisinopril, and furosemide  (16) steroid induced hyperglycemia, on sterlix and 70/30 insulin   PLAN: Our entire 20 minute appointment today was spent counseling Aurther Loft regarding his current treatment regimen, reviewing his lab results, and coordinating care.  I have reviewedTerry's case, including his recent labs, with Dr. Darnelle Catalan today. Overall, Orvill appears to be very stable. He will receive his IV fluids with IV magnesium today as planned, and we will continue with this regimen on a weekly basis. He would like to have his flu shot today, and Dr. Darnelle Catalan feels comfortable with this. Shadman is scheduled to return tomorrow for IVIG for immunoglobulin deficiency.   He is ready to decrease his prednisone slightly, and beginning this week will be alternating 10 mg and 5 mg each morning. We will continue taper slowly, and he will continue on this dose for 2-3  weeks at which time we will reassess and likely decrease further.  He'll continue to follow his blood pressure readings at home. I am refilling his lisinopril today, and he is also scheduled to meet with Dr. Clelia Croft in the next couple of weeks who is also following his hypertension.  I am making no changes in Li's regimen today, including his home medications. Laszlo will continue on Claritin daily for what appears to be some seasonal allergies. Certainly, he will let us know if that worsens or he begins to have any fevers or chills.   He'll continue on his current pain regimen, OxyContin with an occasional hydrocodone/APAP as needed. Daysen continues on acyclovir which I am also refilling today. He'll also continue on vancomycin as before.  Lovie will see Dr. Darnelle Catalan next week on October 2 for followup. We will likely continue with his current regimen, and will continue seeing her approximately every other week for physical exam. Of course in the meanwhile, Willem and [name] know to call with any changes or problems.   Zollie Scale PA-C     09/15/2013

## 2013-09-16 ENCOUNTER — Other Ambulatory Visit: Payer: Self-pay | Admitting: Oncology

## 2013-09-16 ENCOUNTER — Ambulatory Visit: Payer: Self-pay

## 2013-09-16 ENCOUNTER — Other Ambulatory Visit: Payer: Self-pay | Admitting: *Deleted

## 2013-09-16 DIAGNOSIS — R197 Diarrhea, unspecified: Secondary | ICD-10-CM

## 2013-09-20 ENCOUNTER — Other Ambulatory Visit: Payer: Self-pay | Admitting: *Deleted

## 2013-09-21 ENCOUNTER — Telehealth: Payer: Self-pay | Admitting: *Deleted

## 2013-09-21 ENCOUNTER — Other Ambulatory Visit: Payer: Self-pay | Admitting: Oncology

## 2013-09-21 DIAGNOSIS — E1029 Type 1 diabetes mellitus with other diabetic kidney complication: Secondary | ICD-10-CM

## 2013-09-21 NOTE — Telephone Encounter (Signed)
Per desk okn to add the patient to the APP schedule for 10/7. Appt made and patient notified.

## 2013-09-21 NOTE — Telephone Encounter (Signed)
Per desk RN I have moved appts from tomorrow to Friday. Patietn aware

## 2013-09-22 ENCOUNTER — Other Ambulatory Visit: Payer: Self-pay | Admitting: Lab

## 2013-09-22 ENCOUNTER — Ambulatory Visit: Payer: Self-pay

## 2013-09-22 ENCOUNTER — Ambulatory Visit: Payer: Self-pay | Admitting: Oncology

## 2013-09-23 ENCOUNTER — Ambulatory Visit (HOSPITAL_BASED_OUTPATIENT_CLINIC_OR_DEPARTMENT_OTHER): Payer: BC Managed Care – PPO | Admitting: Oncology

## 2013-09-23 ENCOUNTER — Telehealth: Payer: Self-pay | Admitting: *Deleted

## 2013-09-23 ENCOUNTER — Ambulatory Visit (HOSPITAL_BASED_OUTPATIENT_CLINIC_OR_DEPARTMENT_OTHER): Payer: BC Managed Care – PPO

## 2013-09-23 ENCOUNTER — Other Ambulatory Visit: Payer: Self-pay | Admitting: *Deleted

## 2013-09-23 ENCOUNTER — Ambulatory Visit (HOSPITAL_BASED_OUTPATIENT_CLINIC_OR_DEPARTMENT_OTHER): Payer: BC Managed Care – PPO | Admitting: Lab

## 2013-09-23 VITALS — BP 136/71 | HR 73 | Temp 98.2°F | Resp 18 | Wt 177.0 lb

## 2013-09-23 DIAGNOSIS — D801 Nonfamilial hypogammaglobulinemia: Secondary | ICD-10-CM

## 2013-09-23 DIAGNOSIS — C9110 Chronic lymphocytic leukemia of B-cell type not having achieved remission: Secondary | ICD-10-CM

## 2013-09-23 DIAGNOSIS — E860 Dehydration: Secondary | ICD-10-CM

## 2013-09-23 DIAGNOSIS — D89811 Chronic graft-versus-host disease: Secondary | ICD-10-CM

## 2013-09-23 DIAGNOSIS — I4891 Unspecified atrial fibrillation: Secondary | ICD-10-CM

## 2013-09-23 DIAGNOSIS — G9589 Other specified diseases of spinal cord: Secondary | ICD-10-CM

## 2013-09-23 DIAGNOSIS — E2749 Other adrenocortical insufficiency: Secondary | ICD-10-CM

## 2013-09-23 DIAGNOSIS — E139 Other specified diabetes mellitus without complications: Secondary | ICD-10-CM

## 2013-09-23 DIAGNOSIS — E43 Unspecified severe protein-calorie malnutrition: Secondary | ICD-10-CM

## 2013-09-23 LAB — CBC WITH DIFFERENTIAL/PLATELET
BASO%: 0.8 % (ref 0.0–2.0)
Basophils Absolute: 0 10*3/uL (ref 0.0–0.1)
EOS%: 0.4 % (ref 0.0–7.0)
HCT: 34.5 % — ABNORMAL LOW (ref 38.4–49.9)
LYMPH%: 55.7 % — ABNORMAL HIGH (ref 14.0–49.0)
MCH: 31.7 pg (ref 27.2–33.4)
MCHC: 33.5 g/dL (ref 32.0–36.0)
MONO#: 0.7 10*3/uL (ref 0.1–0.9)
MONO%: 14.9 % — ABNORMAL HIGH (ref 0.0–14.0)
NEUT%: 28.2 % — ABNORMAL LOW (ref 39.0–75.0)
Platelets: 194 10*3/uL (ref 140–400)

## 2013-09-23 LAB — COMPREHENSIVE METABOLIC PANEL (CC13)
Albumin: 3.4 g/dL — ABNORMAL LOW (ref 3.5–5.0)
Alkaline Phosphatase: 73 U/L (ref 40–150)
BUN: 39.2 mg/dL — ABNORMAL HIGH (ref 7.0–26.0)
Glucose: 107 mg/dl (ref 70–140)
Potassium: 4.4 mEq/L (ref 3.5–5.1)
Sodium: 139 mEq/L (ref 136–145)
Total Bilirubin: 0.31 mg/dL (ref 0.20–1.20)
Total Protein: 5.4 g/dL — ABNORMAL LOW (ref 6.4–8.3)

## 2013-09-23 MED ORDER — OXYCODONE HCL ER 10 MG PO T12A: 10.0000 mg | EXTENDED_RELEASE_TABLET | Freq: Two times a day (BID) | ORAL | Status: DC

## 2013-09-23 MED ORDER — SODIUM CHLORIDE 0.9 % IV SOLN
Freq: Once | INTRAVENOUS | Status: AC
  Administered 2013-09-23: 11:00:00 via INTRAVENOUS
  Filled 2013-09-23: qty 500

## 2013-09-23 MED ORDER — SODIUM CHLORIDE 0.9 % IJ SOLN
10.0000 mL | INTRAMUSCULAR | Status: DC | PRN
  Administered 2013-09-23: 10 mL via INTRAVENOUS
  Filled 2013-09-23: qty 10

## 2013-09-23 MED ORDER — HEPARIN SOD (PORK) LOCK FLUSH 100 UNIT/ML IV SOLN
500.0000 [IU] | Freq: Once | INTRAVENOUS | Status: AC
  Administered 2013-09-23: 250 [IU] via INTRAVENOUS
  Filled 2013-09-23: qty 5

## 2013-09-23 MED ORDER — INSULIN ASPART PROT & ASPART (70-30 MIX) 100 UNIT/ML PEN: 9.0000 [IU] | PEN_INJECTOR | Freq: Two times a day (BID) | SUBCUTANEOUS | Status: DC

## 2013-09-23 MED ORDER — SODIUM CHLORIDE 0.9 % IV SOLN
INTRAVENOUS | Status: DC
  Administered 2013-09-23: 10:00:00 via INTRAVENOUS

## 2013-09-23 MED ORDER — IMMUNE GLOBULIN (HUMAN) 10 GM/100ML IV SOLN
1.0000 g/kg | Freq: Once | INTRAVENOUS | Status: AC
  Administered 2013-09-23: 80 g via INTRAVENOUS
  Filled 2013-09-23: qty 800

## 2013-09-23 NOTE — Telephone Encounter (Signed)
appts made and printed. Pt is aware that i emailed MW to add the tx. td

## 2013-09-23 NOTE — Progress Notes (Signed)
ID: [name]   DOB: [birth year]  MR#: [id number]  ONG#:[id number]  GMW:NUUV,O DOUGLAS, MD SU: OTHER MD: Donzetta Starch, Romero Belling, Lorina Rabon  CHIEF COMPLAINT:  CLL, status post allogeneic stem cell transplant   HISTORY OF PRESENT ILLNESS: We have very complete records from Dr. Sydnee Levans in [place], and in summary:  The patient was initially diagnosed in August 2000, with a white cell count of 23,600, but normal hemoglobin and platelets, and no significant symptomatology. Over the next several years his white cell count drifted up, and he eventually developed some symptoms of night sweats in particular, leading to treatment with fludarabine, Cytoxan and rituxan for five cycles given between December 2006 and May 2007.  We have CT scans from June 2006, November 2006 and April 2007, and comparing the November 2006 and April 2007 scans, there was near complete response. He had subsequent therapy in [place] as detailed below, but with decreased response, leading to allogeneic stem-cell transplant at the [facility] 02/24/2012.  INTERVAL HISTORY: Jonatan returns with his wife [name]  for followup of his CLL. I evaluated him in the treatment area while he was receiving his IVIG.  REVIEW OF SYSTEMS: Josimar had one loose bowel movements in the last few days, but most of the time he has 2-3 "predictable, formed" bowel movements. He no longer has "accidents". He continues to take the oral vancomycin as before. She has not noted any recurrence skin rash, and his hands in particular feel a lot more pliable. There have been no mouth sores. He is going to see Dr. Margaretha Seeds I regarding his teeth, and a problem being how to take care of some cavities without removing his bridge. He is going to physical therapy at [facility] ([place]) twice a week. He saw Dr. Clelia Croft and he wondered if the patient's insulin should not be decreased now that he is prednisone dose is lower.  She also wondered if the lisinopril dose should be increased. Aside from all this, a detailed review of systems was noncontributory. He continues to work for [employer] out of his home.  PAST MEDICAL HISTORY: Past Medical History  Diagnosis Date  . 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 Diverticular disease   . Hyperlipidemia   . Obesity   . Hypertension   . Hiatal hernia   . CMV (cytomegalovirus) antibody positive     pre-transplant, with seroconversion x2 pst-transplant  . Right bundle branch block     pre-transplant  . CKD (chronic kidney disease) stage 2, GFR 60-89 ml/min   . Pancytopenia   . Steroid-induced diabetes   . Atrial fibrillation     post-transplant  . Myopathy   . 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 Leukemia, chronic lymphoid   . 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 Diarrhea in adult patient 12/05/2012    Due to active GVHD  . CLL (chronic lymphocytic leukemia) 12/05/2012    Dx 07/1999; started Rx 12/06  AlloBMT 3/13  . Rash of face 12/05/2012    Due to GVHD  . Hypomagnesemia 01/26/2013    PAST SURGICAL HISTORY: Past Surgical History  Procedure Laterality Date  . Tonsillectomy and adenoidectomy    . Bone marrow transplant    . Flexible sigmoidoscopy  11/17/2012    Procedure: FLEXIBLE SIGMOIDOSCOPY;  Surgeon: Petra Kuba, MD;  Location: [facility];  Service: Endoscopy;  Laterality: N/A;  Dr Ewing Schlein states will be admitted to rooom 1339 11/16/12  . Esophagogastroduodenoscopy  11/17/2012    Procedure: ESOPHAGOGASTRODUODENOSCOPY (EGD);  Surgeon: Petra Kuba, MD;  Location: [facility];  Service: Endoscopy;  Laterality: N/A;    FAMILY HISTORY Family History  Problem Relation Age of Onset  . Cancer Father    The patient's father died from complications of chronic lymphocytic leukemia at the age of 64.  It had been diagnosed seven years  before when he was 43.  The patient is enrolled in a familial chronic lymphocytic leukemia study out of the [employer] [facility] [employer].  The patient's mother is 25, alive, unfortunately suffering with dementia, and he has a brother, 67, who is otherwise in fair health.   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  His wife of >40 years, [name], is a homemaker.  Their daughter, [name], lives in [place].  She also is a Futures trader.  The patient has an 78 year old grandson and an 57-year-old granddaughter, and that is really the main reason he moved to this area.  He is a International aid/development worker.     ADVANCED DIRECTIVES: In place  HEALTH MAINTENANCE: History  Substance Use Topics  . Smoking status: Never Smoker   . Smokeless tobacco: Never Used  . Alcohol Use: No     Colonoscopy:  PSA:  Bone density:  Lipid panel:  Allergies  Allergen Reactions  . Benadryl [Diphenhydramine Hcl]     "Restless leg syndrome"    Current Outpatient Prescriptions  Medication Sig Dispense Refill  . acyclovir (ZOVIRAX) 400 MG tablet Take 2 tablets (800 mg total) by mouth 2 (two) times daily.  120 tablet  3  . budesonide (ENTOCORT EC) 3 MG 24 hr capsule Take 1 capsule (3 mg total) by mouth 3 (three) times daily.  90 capsule  5  . calcium citrate-vitamin D (CITRACAL+D) 315-200 MG-UNIT per tablet Take 1 tablet by mouth 2 (two) times daily.      . cholecalciferol (VITAMIN D) 1000 UNITS tablet Take 1,000 Units by mouth every evening.       . cholestyramine (QUESTRAN) 4 G packet Take 1 packet by mouth 3 (three) times daily with meals.      Marland Kitchen diltiazem (CARDIZEM CD) 240 MG 24 hr capsule Take 240 mg by mouth daily.      Marland Kitchen diltiazem (CARDIZEM CD) 240 MG 24 hr capsule TAKE 1 CAPSULE BY MOUTH DAILY  30 capsule  3  . ferrous sulfate 325 (65 FE) MG tablet Take 325 mg by mouth daily with breakfast.      . fluconazole  (DIFLUCAN) 100 MG tablet Take 100 mg by mouth daily.      . furosemide (LASIX) 20 MG tablet Take 20 mg by mouth daily.      . Heparin Lock Flush (HEPARIN FLUSH, PORCINE,) 100 UNIT/ML injection 300 Units by Intracatheter route as needed (to flush Hickman port).       Marland Kitchen HYDROcodone-acetaminophen (NORCO) 10-325 MG per tablet Take 1-2 tablets by mouth every 6 (six) hours as needed for pain (breakthrough pain).  60 tablet  0  . Insulin Aspart Prot & Aspart (NOVOLOG MIX 70/30 FLEXPEN) (70-30) 100 UNIT/ML SUPN Inject 9-18 Units into the skin 2 (two) times daily. Take 18 units before breakfast and 9 units before dinner  10 pen  12  . labetalol (NORMODYNE) 200 MG tablet Take 200 mg by  mouth 2 (two) times daily.      . Lidocaine-Hydrocortisone Ace 3-0.5 % KIT Apply 1 application topically as needed (for pain).       Marland Kitchen lisinopril (PRINIVIL,ZESTRIL) 10 MG tablet Take 1 tablet (10 mg total) by mouth every evening.  30 tablet  3  . Multiple Vitamin (MULTIVITAMIN WITH MINERALS) TABS tablet Take 1 tablet by mouth daily.      Marland Kitchen NOVOLOG MIX 70/30 FLEXPEN (70-30) 100 UNIT/ML SUPN INJECT 18 UNITS UNDER THE SKIN IN THE MORNING AND 9 UNITS IN THE EVENING AS DIRECTED  15 mL  1  . omeprazole (PRILOSEC) 20 MG capsule Take 20 mg by mouth daily.      . OxyCODONE (OXYCONTIN) 10 mg T12A 12 hr tablet Take 1 tablet (10 mg total) by mouth every 12 (twelve) hours.  60 tablet  0  . predniSONE (DELTASONE) 10 MG tablet Take 10 mg by mouth daily. Alternate 10 mg with 5 mg daily-per pt      . predniSONE (DELTASONE) 5 MG tablet Take 2.5 mg by mouth daily. Take with a 10 mg tablet to make 12.5mg       . PROTEIN PO Take 6 g by mouth 3 (three) times daily.      . sertraline (ZOLOFT) 50 MG tablet Take 50-100 mg by mouth as directed. Am      . Sodium Chloride Flush (NORMAL SALINE FLUSH) 0.9 % SOLN 10 mLs by Intracatheter route as needed (to flush Hickman).       Marland Kitchen sulfamethoxazole-trimethoprim (BACTRIM DS) 800-160 MG per tablet Take 1 tablet  by mouth daily.      . tacrolimus (PROGRAF) 0.5 MG capsule Take 1.5 mg by mouth 2 (two) times daily.      Marland Kitchen testosterone (ANDRODERM) 4 MG/24HR PT24 patch Place 1 patch onto the skin daily.      . vancomycin (VANCOCIN) 50 mg/mL oral solution Take 2.5 mLs (125 mg total) by mouth every 6 (six) hours.  140 mL  1  . zinc sulfate 220 MG capsule Take 220 mg by mouth daily.      . [DISCONTINUED] insulin aspart (NOVOLOG FLEXPEN) 100 UNIT/ML SOPN FlexPen 18units sq qam, 9units sq qpm, or as directed  15 mL  1   No current facility-administered medications for this visit.   Facility-Administered Medications Ordered in Other Visits  Medication Dose Route Frequency Provider Last Rate Last Dose  . 0.9 %  sodium chloride infusion   Intravenous Continuous Amy G Berry, PA-C 500 mL/hr at 03/12/13 0900    . sodium chloride 0.9 % injection 10 mL  10 mL Intravenous PRN Lowella Dell, MD   10 mL at 08/11/12 1606    OBJECTIVE: Middle-aged white male who appears comfortable and is in no acute distress  There were no vitals filed for this visit.   There is no weight on file to calculate BMI.   There were no vitals filed for this visit.ECOG FS: 1 Vitals - 1 value per visit 09/23/2013  SYSTOLIC 136  DIASTOLIC 71  Pulse 73  Temperature 98.2  Respirations 18  Weight (lb) 177.03  Height   BMI 27.72  VISIT REPORT    Sclerae unicteric; no scaliness or dryness noted Oropharynx shows no thrush or ulcerations No cervical or supraclavicular adenopathy Lungs no rales or rhonchi Heart regular rate and rhythm, no murmur appreciated Abd soft, positive bowel sounds, no masses palpated, no splenomegaly MSK no focal spinal tenderness, minimal bilateral ankle edema Neuro: nonfocal, well oriented,  pleasant affect Skin: No rash, normal turgor and flexibility. Multiple ecchymoses as previously noted  LAB RESULTS: Results for GRACEN, SOUTHWELL (MRN [id number]) as of 09/25/2013 09:39  Ref. Range 07/06/2013 12:50 07/28/2013 11:03  08/11/2013 11:12 08/25/2013 11:35 09/08/2013 10:39  IgG (Immunoglobin G), Serum Latest Range: [phone] mg/dL 409 (L) 811 914 (L) 782 (L) 335 (L)   CBC    Component Value Date/Time   WBC 4.9 09/23/2013 1010   WBC 7.3 08/18/2013 0725   RBC 3.64* 09/23/2013 1010   RBC 3.60* 08/18/2013 0725   RBC 3.82* 03/16/2013 1400   HGB 11.5* 09/23/2013 1010   HGB 11.2* 08/18/2013 0725   HCT 34.5* 09/23/2013 1010   HCT 33.8* 08/18/2013 0725   PLT 194 09/23/2013 1010   PLT 112* 08/18/2013 0725   MCV 94.6 09/23/2013 1010   MCV 93.9 08/18/2013 0725   MCH 31.7 09/23/2013 1010   MCH 31.1 08/18/2013 0725   MCHC 33.5 09/23/2013 1010   MCHC 33.1 08/18/2013 0725   RDW 14.2 09/23/2013 1010   RDW 14.6 08/18/2013 0725   LYMPHSABS 2.7 09/23/2013 1010   LYMPHSABS 1.4 03/18/2013 0615   MONOABS 0.7 09/23/2013 1010   MONOABS 0.3 03/18/2013 0615   EOSABS 0.0 09/23/2013 1010   EOSABS 0.0 03/18/2013 0615   BASOSABS 0.0 09/23/2013 1010   BASOSABS 0.0 03/18/2013 0615        Chemistry      Component Value Date/Time   NA 139 09/23/2013 1009   NA 131* 06/14/2013 0800   K 4.4 09/23/2013 1009   K 4.7 06/14/2013 0800   CL 101 06/15/2013 1034   CL 97 06/14/2013 0800   CO2 23 09/23/2013 1009   CO2 24 06/14/2013 0800   BUN 39.2* 09/23/2013 1009   BUN 38* 06/14/2013 0800   CREATININE 1.1 09/23/2013 1009   CREATININE 0.97 06/14/2013 0800      Component Value Date/Time   CALCIUM 8.9 09/23/2013 1009   CALCIUM 9.1 06/14/2013 0800   ALKPHOS 73 09/23/2013 1009   ALKPHOS 75 04/18/2013 1218   AST 20 09/23/2013 1009   AST 24 04/18/2013 1218   ALT 29 09/23/2013 1009   ALT 33 04/18/2013 1218   BILITOT 0.31 09/23/2013 1009   BILITOT 0.2* 04/18/2013 1218     Magnesium  1.6  09/08/2013    1.7  08/25/2013    1.6  08/11/2013    1.7  08/04/2013    1.5  07/21/2013    1.7  07/06/2013    1.6  06/30/2013    1.9  06/06/2013      IgG   335  09/08/2013    464  08/25/2013  IgA   <7  09/08/2013    <7  08/25/2013  IgM      7  09/18/214    <4  08/25/2013     STUDIES:  No results found.  ASSESSMENT: 67 y.o. [place] man with a history of well-differentiated lymphocytic lymphoma/ chronic lymphoid leukemia initially diagnosed in 2000, not requiring intervention until 2006; with multiple chromosomal abnormalities.  His treatment history is as follows:  (1) fludarabine/cyclophosphamide/rituximab x5 completed May 2007.   (2) rituximab for 8 doses October 2010, with partial response   (3) Leustatin and ofatumumab weekly x8 July to September 2011 followed by maintenance ofatumumab maintenance ofatumumab every 2 months, with initial response but rising counts September 2012   (4) status-post unrelated donor stem-cell transplant 02/24/2012 at the [facility]  (a) conditioning regimen consisted of fludarabine + TBI at 200 cGy,  followed by rituximab x27;  (b) CMV reactivation x3 (patient CMV positive, donor negative), s/p ganciclovir treatment; 3d reactivation August 2013, s/p gancyclovir, with negative PCR mid-September 2013; last gancyclovir dose 10/06/2012 (c) Chronic GVHD: involving gut and skin, treated with steroids, tacrolimus and MMF.  MMF was eventually d/c'd and tacrolimus currently at a dose of 1.5mg  BID (d) atrial fibrillation: resolved on brief amiodarone regimen (e) steroid-induced myopathy: improving  (f) hypomagnesemia: improved after d/c gancyclovir (g) hypogammaglobulinemia: s/p IVIG most recently 01/07/2013. (h) history of elevated triglycerides (606 on 07/14/2012)  (i) adrenal insufficiency: on prednisone and budesonide (j) pancytopenia, improved  (5) restaging studies September 2013  including CT scans, flow cytometry, and bone marrow biopsy, showed no evidence of residual chronic lymphoid leukemia.  (6) recurrent GVHD (skin rash, mouth changes, severe diarrhea and gastric/duodenal/colonic biopsies 11/17/2012 c/w GVHD grade 2) now only remaining sign is bothersome diarrhea; c diff negative x3;  continuing current regimen   (7) severe malnutrition -- on VITAL supplement in addition to regular diet; on Marinol for anorexia  (8) testosterone deficiency--on patch   (9) deconditioning: ongoing REHAB   (10) mild dehydration: encouraged increased po fluids; receives IVF support w magnesium three times weekly  (11) steroid-induced osteoporosis with compression fractures: received pamidronate 12/18/2012. Status post kyphoplasty at L3-4 in June 2014. Still with some chronic back pain controlled with OxyContin and hydrocodone/APAP.  (12) nausea: improved on current meds  (13)  Positive c.diff, 03/08/2013, on Flagyl 500 mg TID x 20 days, then on oral vanco with Questran, showing improvement  (14) hyperkalemia, resolved  (15)  Hypertension, on labetalol, cardizem, lisinopril, and furosemide  (16) steroid induced hyperglycemia, on sterlix and 70/30 insulin   PLAN: Dorene Sorrow is now on prednisone 10 mg in the morning and 5 in the evening. He will remain on this regimen another 3 weeks, and then we will drop the dose to 10 in the morning and 2 point 5 in the evening for another 3 weeks or so. As we dropped the steroids his sugars become more normal, and I think we can probably drop his insulin as suggested by Dr. Clelia Croft. We are actually going to drop a little bit more than Dr. Clelia Croft suggested, namely to 10 in the morning and 5 in the evening. He keeps very careful data on his sugars and if this is too much of a drop she will call and let us know.   I think his blood pressure will also be coming down as the prednisone is tapered. Accordingly we are not increasing his lisinopril today.  I am going to obtain a repeat C. difficile next visit. We will need 3 negative C. difficile determinations before we we consider going off the oral vancomycin.  Overall very continues to very slowly improve in his functional status. He will be going back to [place] in February. We're currently continuing weekly  magnesium supplementation and visits every 2 weeks. He knows to call for any problems that may develop before the next visit here.  Marland KitchenLowella Dell MD     09/25/2013

## 2013-09-26 ENCOUNTER — Telehealth: Payer: Self-pay | Admitting: *Deleted

## 2013-09-26 NOTE — Telephone Encounter (Signed)
Per staff message and POF I have scheduled appts.  Timothy [name]  

## 2013-09-28 ENCOUNTER — Other Ambulatory Visit: Payer: Self-pay | Admitting: Dermatology

## 2013-09-28 ENCOUNTER — Other Ambulatory Visit: Payer: Self-pay | Admitting: *Deleted

## 2013-09-28 DIAGNOSIS — C9110 Chronic lymphocytic leukemia of B-cell type not having achieved remission: Secondary | ICD-10-CM

## 2013-09-28 DIAGNOSIS — D89811 Chronic graft-versus-host disease: Secondary | ICD-10-CM

## 2013-09-29 ENCOUNTER — Other Ambulatory Visit (HOSPITAL_BASED_OUTPATIENT_CLINIC_OR_DEPARTMENT_OTHER): Payer: BC Managed Care – PPO

## 2013-09-29 ENCOUNTER — Other Ambulatory Visit: Payer: Self-pay | Admitting: *Deleted

## 2013-09-29 ENCOUNTER — Other Ambulatory Visit: Payer: Self-pay | Admitting: Lab

## 2013-09-29 ENCOUNTER — Ambulatory Visit (HOSPITAL_BASED_OUTPATIENT_CLINIC_OR_DEPARTMENT_OTHER): Payer: BC Managed Care – PPO

## 2013-09-29 VITALS — BP 135/76 | HR 69 | Temp 99.0°F | Resp 20

## 2013-09-29 DIAGNOSIS — C9110 Chronic lymphocytic leukemia of B-cell type not having achieved remission: Secondary | ICD-10-CM

## 2013-09-29 DIAGNOSIS — I10 Essential (primary) hypertension: Secondary | ICD-10-CM

## 2013-09-29 DIAGNOSIS — D809 Immunodeficiency with predominantly antibody defects, unspecified: Secondary | ICD-10-CM

## 2013-09-29 DIAGNOSIS — G8929 Other chronic pain: Secondary | ICD-10-CM

## 2013-09-29 DIAGNOSIS — D89811 Chronic graft-versus-host disease: Secondary | ICD-10-CM

## 2013-09-29 DIAGNOSIS — T8609 Other complications of bone marrow transplant: Secondary | ICD-10-CM

## 2013-09-29 LAB — CBC WITH DIFFERENTIAL/PLATELET
Basophils Absolute: 0 10*3/uL (ref 0.0–0.1)
EOS%: 0.3 % (ref 0.0–7.0)
Eosinophils Absolute: 0 10*3/uL (ref 0.0–0.5)
HCT: 37.5 % — ABNORMAL LOW (ref 38.4–49.9)
LYMPH%: 57.1 % — ABNORMAL HIGH (ref 14.0–49.0)
MCH: 30.8 pg (ref 27.2–33.4)
MCHC: 32.8 g/dL (ref 32.0–36.0)
MCV: 94 fL (ref 79.3–98.0)
MONO%: 13 % (ref 0.0–14.0)
NEUT#: 1.7 10*3/uL (ref 1.5–6.5)
NEUT%: 29.1 % — ABNORMAL LOW (ref 39.0–75.0)
RBC: 3.99 10*6/uL — ABNORMAL LOW (ref 4.20–5.82)
RDW: 14 % (ref 11.0–14.6)
nRBC: 0 % (ref 0–0)

## 2013-09-29 LAB — MAGNESIUM (CC13): Magnesium: 1.9 mg/dl (ref 1.5–2.5)

## 2013-09-29 MED ORDER — LISINOPRIL 10 MG PO TABS: 10.0000 mg | ORAL_TABLET | Freq: Every evening | ORAL | Status: DC

## 2013-09-29 MED ORDER — HEPARIN SOD (PORK) LOCK FLUSH 100 UNIT/ML IV SOLN
250.0000 [IU] | Freq: Once | INTRAVENOUS | Status: AC
  Administered 2013-09-29: 250 [IU] via INTRAVENOUS
  Filled 2013-09-29: qty 5

## 2013-09-29 MED ORDER — SODIUM CHLORIDE 0.9 % IJ SOLN
10.0000 mL | Freq: Once | INTRAMUSCULAR | Status: AC
  Administered 2013-09-29: 10 mL via INTRAVENOUS
  Filled 2013-09-29: qty 10

## 2013-09-29 MED ORDER — HYDROCODONE-ACETAMINOPHEN 10-325 MG PO TABS: 1.0000 | ORAL_TABLET | Freq: Four times a day (QID) | ORAL | Status: DC | PRN

## 2013-09-29 MED ORDER — SODIUM CHLORIDE 0.9 % IV SOLN
Freq: Once | INTRAVENOUS | Status: AC
  Administered 2013-09-29: 10:00:00 via INTRAVENOUS
  Filled 2013-09-29: qty 500

## 2013-09-29 NOTE — Patient Instructions (Signed)
Magnesium This is a blood test which measures the amount of magnesium in your blood. Most of the magnesium in your body exists in your cells. However, the magnesium in your blood is important for many processes. It is important for your nerves to be able to conduct electrical energy. This is important in heart patients with higher heartbeats. When your heart beats and then gets ready to beat again, it repolarizes. If the magnesium levels are low or high, it can affect the accuracy of the repolarization process. The magnesium levels are also monitored during pregnancy. This is done to determine if the expectant mother may have preeclampsia or toxemia. Magnesium is often used for treatment of these problems. PREPARATION FOR TEST No preparation or fasting is needed. A blood sample may be taken by inserting a needle into a vein in the arm.  NORMAL FINDINGS  Adult: 1.3 to 2.1 mEq/L or 0.65 to 1.05 mmol/L (SI units)  Child: 1.4 to 1.7 mEq/L  Newborn: 1.4 to 2 mEq/L Possible critical values: less than 0.5 mEq/L or greater than 3 mEq/L Ranges for normal findings may vary among different laboratories and hospitals. You should always check with your caregiver after having lab work or other tests done to discuss the meaning of your test results and whether your values are considered within normal limits. MEANING OF TEST  Your caregiver will go over the test results with you. Your caregiver will discuss the importance and meaning of your results. He or she will also discuss treatment options and additional tests, if needed. OBTAINING THE TEST RESULTS It is your responsibility to obtain your test results. Ask the lab or department performing the test when and how you will get your results. Document Released: 01/10/2005 Document Revised: 03/01/2012 Document Reviewed: 11/18/2008 [facility] [place] [facility] Patient Information 2014 [place], [place].

## 2013-09-30 ENCOUNTER — Other Ambulatory Visit: Payer: Self-pay | Admitting: Oncology

## 2013-09-30 DIAGNOSIS — C9110 Chronic lymphocytic leukemia of B-cell type not having achieved remission: Secondary | ICD-10-CM

## 2013-10-03 ENCOUNTER — Other Ambulatory Visit: Payer: Self-pay | Admitting: *Deleted

## 2013-10-03 ENCOUNTER — Telehealth: Payer: Self-pay | Admitting: *Deleted

## 2013-10-04 ENCOUNTER — Other Ambulatory Visit: Payer: Self-pay | Admitting: *Deleted

## 2013-10-04 DIAGNOSIS — A0471 Enterocolitis due to Clostridium difficile, recurrent: Secondary | ICD-10-CM

## 2013-10-04 MED ORDER — VANCOMYCIN 50 MG/ML ORAL SOLUTION: 125.0000 mg | Freq: Four times a day (QID) | ORAL | Status: DC

## 2013-10-07 ENCOUNTER — Telehealth: Payer: Self-pay | Admitting: *Deleted

## 2013-10-07 ENCOUNTER — Ambulatory Visit (HOSPITAL_BASED_OUTPATIENT_CLINIC_OR_DEPARTMENT_OTHER): Payer: BC Managed Care – PPO | Admitting: Physician Assistant

## 2013-10-07 ENCOUNTER — Other Ambulatory Visit: Payer: Self-pay | Admitting: Lab

## 2013-10-07 ENCOUNTER — Ambulatory Visit (HOSPITAL_BASED_OUTPATIENT_CLINIC_OR_DEPARTMENT_OTHER): Payer: BC Managed Care – PPO

## 2013-10-07 ENCOUNTER — Other Ambulatory Visit (HOSPITAL_BASED_OUTPATIENT_CLINIC_OR_DEPARTMENT_OTHER): Payer: BC Managed Care – PPO | Admitting: Lab

## 2013-10-07 ENCOUNTER — Telehealth: Payer: Self-pay | Admitting: Oncology

## 2013-10-07 ENCOUNTER — Encounter: Payer: Self-pay | Admitting: Physician Assistant

## 2013-10-07 VITALS — BP 128/80 | HR 73 | Temp 98.1°F | Resp 20 | Ht 67.0 in | Wt 175.8 lb

## 2013-10-07 DIAGNOSIS — D809 Immunodeficiency with predominantly antibody defects, unspecified: Secondary | ICD-10-CM

## 2013-10-07 DIAGNOSIS — C9110 Chronic lymphocytic leukemia of B-cell type not having achieved remission: Secondary | ICD-10-CM

## 2013-10-07 DIAGNOSIS — D61818 Other pancytopenia: Secondary | ICD-10-CM

## 2013-10-07 DIAGNOSIS — D89811 Chronic graft-versus-host disease: Secondary | ICD-10-CM

## 2013-10-07 DIAGNOSIS — R197 Diarrhea, unspecified: Secondary | ICD-10-CM

## 2013-10-07 DIAGNOSIS — Z9489 Other transplanted organ and tissue status: Secondary | ICD-10-CM

## 2013-10-07 DIAGNOSIS — E860 Dehydration: Secondary | ICD-10-CM

## 2013-10-07 DIAGNOSIS — I10 Essential (primary) hypertension: Secondary | ICD-10-CM

## 2013-10-07 DIAGNOSIS — D801 Nonfamilial hypogammaglobulinemia: Secondary | ICD-10-CM

## 2013-10-07 DIAGNOSIS — I4891 Unspecified atrial fibrillation: Secondary | ICD-10-CM

## 2013-10-07 DIAGNOSIS — T8609 Other complications of bone marrow transplant: Secondary | ICD-10-CM

## 2013-10-07 DIAGNOSIS — E099 Drug or chemical induced diabetes mellitus without complications: Secondary | ICD-10-CM

## 2013-10-07 DIAGNOSIS — G8929 Other chronic pain: Secondary | ICD-10-CM

## 2013-10-07 LAB — CBC WITH DIFFERENTIAL/PLATELET
BASO%: 0.1 % (ref 0.0–2.0)
Eosinophils Absolute: 0 10*3/uL (ref 0.0–0.5)
LYMPH%: 34.6 % (ref 14.0–49.0)
MCHC: 33.4 g/dL (ref 32.0–36.0)
MCV: 92.7 fL (ref 79.3–98.0)
MONO%: 7.4 % (ref 0.0–14.0)
NEUT#: 4.6 10*3/uL (ref 1.5–6.5)
RBC: 3.97 10*6/uL — ABNORMAL LOW (ref 4.20–5.82)
RDW: 13.9 % (ref 11.0–14.6)
WBC: 8 10*3/uL (ref 4.0–10.3)
lymph#: 2.8 10*3/uL (ref 0.9–3.3)

## 2013-10-07 LAB — COMPREHENSIVE METABOLIC PANEL (CC13)
ALT: 31 U/L (ref 0–55)
AST: 50 U/L — ABNORMAL HIGH (ref 5–34)
Albumin: 3.4 g/dL — ABNORMAL LOW (ref 3.5–5.0)
Alkaline Phosphatase: 73 U/L (ref 40–150)
Glucose: 144 mg/dl — ABNORMAL HIGH (ref 70–140)
Potassium: 4.6 mEq/L (ref 3.5–5.1)
Sodium: 133 mEq/L — ABNORMAL LOW (ref 136–145)
Total Protein: 6.3 g/dL — ABNORMAL LOW (ref 6.4–8.3)

## 2013-10-07 LAB — MAGNESIUM (CC13): Magnesium: 1.6 mg/dl (ref 1.5–2.5)

## 2013-10-07 MED ORDER — SODIUM CHLORIDE 0.9 % IV SOLN
Freq: Once | INTRAVENOUS | Status: AC
  Administered 2013-10-07: 15:00:00 via INTRAVENOUS
  Filled 2013-10-07: qty 500

## 2013-10-07 MED ORDER — HEPARIN SOD (PORK) LOCK FLUSH 100 UNIT/ML IV SOLN
250.0000 [IU] | Freq: Once | INTRAVENOUS | Status: AC
  Administered 2013-10-07: 250 [IU] via INTRAVENOUS
  Filled 2013-10-07: qty 5

## 2013-10-07 MED ORDER — SODIUM CHLORIDE 0.9 % IJ SOLN
10.0000 mL | Freq: Once | INTRAMUSCULAR | Status: AC
  Administered 2013-10-07: 10 mL via INTRAVENOUS
  Filled 2013-10-07: qty 10

## 2013-10-07 NOTE — Patient Instructions (Signed)
Hypomagnesemia Magnesium is a common ion (mineral) in the body which is needed for metabolism. It is about how the body handles food and other chemical reactions necessary for life. Only about 2% of the magnesium in our body is found in the blood. When this is low, it is called hypomagnesemia. The blood will measure only a tiny amount of the magnesium in our body. When it is low in our blood, it does not mean that the whole body supply is low. The normal serum concentration ranges from 1.8-2.5 mEq/L. When the level gets to be less than 1.0 mEq/L, a number of problems begin to happen.  CAUSES   Receiving intravenous fluids without magnesium replacement.  Loss of magnesium from the bowel by naso-gastric suction.  Loss of magnesium from nausea and vomiting or severe diarrhea. Any of the inflammatory bowel conditions can cause this.  Abuse of alcohol often leads to low serum magnesium.  An inherited form of magnesium loss happens when the kidneys lose magnesium. This is called familial or primary hypomagnesemia.  Some medications such as diuretics also cause the loss of magnesium. SYMPTOMS  These following problems are worse if the changes in magnesium levels come on suddenly.  Tremor.  Confusion.  Muscle weakness.  Over-sensitive to sights and sounds.  Sensitive reflexes.  Depression.  Muscular fibrillations.  Over-reactivity of the nerves.  Irritability.  Psychosis.  Spasms of the hand muscles.  Tetany (where the muscles go into uncontrollable spasms). DIAGNOSIS  This condition can be diagnosed by blood tests. TREATMENT   In emergency, magnesium can be given intravenously (by vein).  If the condition is less worrisome, it can be corrected by diet. High levels of magnesium are found in green leafy vegetables, peas, beans and nuts among other things. It can also be given through medications by mouth.  If it is being caused by medications, changes can be made.  If  alcohol is a problem, help is available if there are difficulties giving it up. Document Released: 09/03/2005 Document Revised: 03/01/2012 Document Reviewed: 07/28/2008 [employer] Patient Information 2014 [employer].        

## 2013-10-07 NOTE — Progress Notes (Signed)
ID: Timothy [name]   DOB: Oct 19, 1946  MR#: [id number]  NFA#:[id number]  ION:GEXB,M DOUGLAS, MD SU: OTHER MD: Karlyn Agee, Romero Belling, Lorina Rabon  CHIEF COMPLAINT:  CLL, status post allogeneic stem cell transplant   HISTORY OF PRESENT ILLNESS: We have very complete records from Dr. Sydnee [name] in [place], and in summary:  The patient was initially diagnosed in August 2000, with a white cell count of 23,600, but normal hemoglobin and platelets, and no significant symptomatology. Over the next several years his white cell count drifted up, and he eventually developed some symptoms of night sweats in particular, leading to treatment with fludarabine, Cytoxan and rituxan for five cycles given between December 2006 and May 2007.  We have CT scans from June 2006, November 2006 and April 2007, and comparing the November 2006 and April 2007 scans, there was near complete response. He had subsequent therapy in [place] as detailed below, but with decreased response, leading to allogeneic stem-cell transplant at the [facility] 02/24/2012.  INTERVAL HISTORY: Timothy [name] returns with his wife Timothy [name]  for followup of his CLL.  He continues to receive weekly IV fluids with IV magnesium, and is here for his infusion today.  Interval history is generally unremarkable, and Timothy [name] tells me he is feeling "better and better". He has had a few loose bowel movements this past week, but more times than not they are formed and soft. He averages 2-3 bowel movements daily. He continues on oral vancomycin as before. He's had no blood or mucus in the stool.   He also denies any abdominal pain.  His energy level is improving overall. He continues with some physical therapy, and is walking easily now, even without his cane for support.   I will mention that to resolve Timothy [name] recently and had a small squamous cell carcinoma removed from his scalp. They also produce several areas that were thought to be  precancerous.  REVIEW OF SYSTEMS: Timothy [name]  has had no fevers or chills. He denies any skin changes or rashes. He still bruises easily but has had no abnormal bleeding. His appetite is good. His blood sugars are better controlled. He's had no nausea or emesis. He denies any cough, shortness of breath, chest pain, or palpitations. He's had no abnormal headaches or dizziness. He continues to have some chronic back pain for which he takes his pain medications appropriately and affectively. He denies any new or unusual myalgias, arthralgias, or bony pain, and has had no peripheral neuropathy. The swelling in his feet and ankles is minimal.  A detailed review of systems is otherwise stable and noncontributory.   PAST MEDICAL HISTORY: Past Medical History  Diagnosis Date  . 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 Diverticular disease   . Hyperlipidemia   . Obesity   . Hypertension   . Hiatal hernia   . CMV (cytomegalovirus) antibody positive     pre-transplant, with seroconversion x2 pst-transplant  . Right bundle branch block     pre-transplant  . CKD (chronic kidney disease) stage 2, GFR 60-89 ml/min   . Pancytopenia   . Steroid-induced diabetes   . Atrial fibrillation     post-transplant  . Myopathy   . 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 Leukemia, chronic lymphoid   . 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 Diarrhea in adult patient 12/05/2012    Due to active GVHD  .  CLL (chronic lymphocytic leukemia) 12/05/2012    Dx 07/1999; started Rx 12/06  AlloBMT 3/13  . Rash of face 12/05/2012    Due to GVHD  . Hypomagnesemia 01/26/2013    PAST SURGICAL HISTORY: Past Surgical History  Procedure Laterality Date  . Tonsillectomy and adenoidectomy    . Bone marrow transplant    . Flexible sigmoidoscopy  11/17/2012    Procedure: FLEXIBLE SIGMOIDOSCOPY;  Surgeon: Petra Kuba, MD;   Location: [facility] ENDOSCOPY;  Service: Endoscopy;  Laterality: N/A;  Dr Ewing Schlein states will be admitted to rooom 1339 11/16/12  . Esophagogastroduodenoscopy  11/17/2012    Procedure: ESOPHAGOGASTRODUODENOSCOPY (EGD);  Surgeon: Petra Kuba, MD;  Location: [facility];  Service: Endoscopy;  Laterality: N/A;    FAMILY HISTORY Family History  Problem Relation Age of Onset  . Cancer Father    The patient's father died from complications of chronic lymphocytic leukemia at the age of 36.  It had been diagnosed seven years before when he was 39.  The patient is enrolled in a familial chronic lymphocytic leukemia study out of the [facility].  The patient's mother is 26, alive, unfortunately suffering with dementia, and he has a brother, 38, who is otherwise in fair health.   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  His wife of >40 years, Timothy [name], is a homemaker.  Their daughter, Timothy [name], lives in [place].  She also is a Futures trader.  The patient has an 61 year old grandson and an 27-year-old granddaughter, and that is really the main reason he moved to this area.  He is a International aid/development worker.     ADVANCED DIRECTIVES: In place  HEALTH MAINTENANCE: (Updated 10/07/2013) History  Substance Use Topics  . Smoking status: Never Smoker   . Smokeless tobacco: Never Used  . Alcohol Use: No     Colonoscopy: Nov 2013, Dr. Ewing Schlein  PSA:  Bone density:    Lipid panel: May 2014, elevated    Allergies  Allergen Reactions  . Benadryl [Diphenhydramine Hcl]     "Restless leg syndrome"    Current Outpatient Prescriptions  Medication Sig Dispense Refill  . acyclovir (ZOVIRAX) 400 MG tablet Take 2 tablets (800 mg total) by mouth 2 (two) times daily.  120 tablet  3  . budesonide (ENTOCORT EC) 3 MG 24 hr capsule Take 1 capsule (3 mg total) by mouth 3 (three) times daily.  90 capsule  5   . calcium citrate-vitamin D (CITRACAL+D) 315-200 MG-UNIT per tablet Take 1 tablet by mouth 2 (two) times daily.      . cholecalciferol (VITAMIN D) 1000 UNITS tablet Take 1,000 Units by mouth every evening.       . cholestyramine (QUESTRAN) 4 G packet Take 1 packet by mouth 3 (three) times daily with meals.      Marland Kitchen diltiazem (CARDIZEM CD) 240 MG 24 hr capsule Take 240 mg by mouth daily.      Marland Kitchen diltiazem (CARDIZEM CD) 240 MG 24 hr capsule TAKE 1 CAPSULE BY MOUTH DAILY  30 capsule  3  . ferrous sulfate 325 (65 FE) MG tablet Take 325 mg by mouth daily with breakfast.      . fluconazole (DIFLUCAN) 100 MG tablet Take 100 mg by mouth daily.      . furosemide (LASIX) 20 MG tablet Take 20 mg by mouth daily.      . Heparin Lock Flush (  HEPARIN FLUSH, PORCINE,) 100 UNIT/ML injection 300 Units by Intracatheter route as needed (to flush Hickman port).       Marland Kitchen HYDROcodone-acetaminophen (NORCO) 10-325 MG per tablet Take 1-2 tablets by mouth every 6 (six) hours as needed for pain (breakthrough pain).  60 tablet  0  . Insulin Aspart Prot & Aspart (NOVOLOG MIX 70/30 FLEXPEN) (70-30) 100 UNIT/ML SUPN Inject 9-18 Units into the skin 2 (two) times daily. Take 18 units before breakfast and 9 units before dinner  10 pen  12  . labetalol (NORMODYNE) 200 MG tablet Take 200 mg by mouth 2 (two) times daily.      . Lidocaine-Hydrocortisone Ace 3-0.5 % KIT Apply 1 application topically as needed (for pain).       Marland Kitchen lisinopril (PRINIVIL,ZESTRIL) 10 MG tablet Take 1 tablet (10 mg total) by mouth every evening.  30 tablet  3  . Multiple Vitamin (MULTIVITAMIN WITH MINERALS) TABS tablet Take 1 tablet by mouth daily.      Marland Kitchen NOVOLOG MIX 70/30 FLEXPEN (70-30) 100 UNIT/ML SUPN INJECT 18 UNITS UNDER THE SKIN IN THE MORNING AND 9 UNITS IN THE EVENING AS DIRECTED  15 mL  1  . omeprazole (PRILOSEC) 20 MG capsule Take 20 mg by mouth daily.      . OxyCODONE (OXYCONTIN) 10 mg T12A 12 hr tablet Take 1 tablet (10 mg total) by mouth every 12  (twelve) hours.  60 tablet  0  . predniSONE (DELTASONE) 10 MG tablet Take 10 mg by mouth daily. Alternate 10 mg with 5 mg daily-per pt      . predniSONE (DELTASONE) 5 MG tablet Take 2.5 mg by mouth daily. Take with a 10 mg tablet to make 12.5mg       . PROTEIN PO Take 6 g by mouth 3 (three) times daily.      . sertraline (ZOLOFT) 50 MG tablet ALTERNATE TAKING 1 TABLET DAILY AND 2 TABLETS DAILY  90 tablet  3  . Sodium Chloride Flush (NORMAL SALINE FLUSH) 0.9 % SOLN 10 mLs by Intracatheter route as needed (to flush Hickman).       Marland Kitchen sulfamethoxazole-trimethoprim (BACTRIM DS) 800-160 MG per tablet Take 1 tablet by mouth daily.      . tacrolimus (PROGRAF) 0.5 MG capsule Take 1.5 mg by mouth 2 (two) times daily.      Marland Kitchen testosterone (ANDRODERM) 4 MG/24HR PT24 patch Place 1 patch onto the skin daily.      . vancomycin (VANCOCIN) 50 mg/mL oral solution Take 2.5 mLs (125 mg total) by mouth every 6 (six) hours.  140 mL  0  . zinc sulfate 220 MG capsule Take 220 mg by mouth daily.      . [DISCONTINUED] insulin aspart (NOVOLOG FLEXPEN) 100 UNIT/ML SOPN FlexPen 18units sq qam, 9units sq qpm, or as directed  15 mL  1   No current facility-administered medications for this visit.   Facility-Administered Medications Ordered in Other Visits  Medication Dose Route Frequency Provider Last Rate Last Dose  . 0.9 %  sodium chloride infusion   Intravenous Continuous Jarold Macomber G Justinn Welter, PA-C 500 mL/hr at 03/12/13 0900    . sodium chloride 0.9 % injection 10 mL  10 mL Intravenous PRN Lowella Dell, MD   10 mL at 08/11/12 1606  . sodium chloride 0.9 % injection 10 mL  10 mL Intravenous Once Lowella Dell, MD        OBJECTIVE: Middle-aged white male who appears comfortable and is in no acute  distress   Filed Vitals:   10/07/13 1410  BP: 128/80  Pulse: 73  Temp: 98.1 F (36.7 C)  Resp: 20     Body mass index is 27.53 kg/(m^2).   Filed Weights   10/07/13 1410  Weight: 175 lb 12.8 oz (79.742 kg)  ECOG FS:  1 Remainder of physical exam was deferred today     LAB RESULTS:  CBC    Component Value Date/Time   WBC 8.0 10/07/2013 1439   WBC 7.3 08/18/2013 0725   RBC 3.97* 10/07/2013 1439   RBC 3.60* 08/18/2013 0725   RBC 3.82* 03/16/2013 1400   HGB 12.3* 10/07/2013 1439   HGB 11.2* 08/18/2013 0725   HCT 36.8* 10/07/2013 1439   HCT 33.8* 08/18/2013 0725   PLT 128* 10/07/2013 1439   PLT 112* 08/18/2013 0725   MCV 92.7 10/07/2013 1439   MCV 93.9 08/18/2013 0725   MCH 31.0 10/07/2013 1439   MCH 31.1 08/18/2013 0725   MCHC 33.4 10/07/2013 1439   MCHC 33.1 08/18/2013 0725   RDW 13.9 10/07/2013 1439   RDW 14.6 08/18/2013 0725   LYMPHSABS 2.8 10/07/2013 1439   LYMPHSABS 1.4 03/18/2013 0615   MONOABS 0.6 10/07/2013 1439   MONOABS 0.3 03/18/2013 0615   EOSABS 0.0 10/07/2013 1439   EOSABS 0.0 03/18/2013 0615   BASOSABS 0.0 10/07/2013 1439   BASOSABS 0.0 03/18/2013 0615        Chemistry      Component Value Date/Time   NA 133* 10/07/2013 1439   NA 131* 06/14/2013 0800   K 4.6 10/07/2013 1439   K 4.7 06/14/2013 0800   CL 101 06/15/2013 1034   CL 97 06/14/2013 0800   CO2 18* 10/07/2013 1439   CO2 24 06/14/2013 0800   BUN 35.9* 10/07/2013 1439   BUN 38* 06/14/2013 0800   CREATININE 1.1 10/07/2013 1439   CREATININE 0.97 06/14/2013 0800      Component Value Date/Time   CALCIUM 9.0 10/07/2013 1439   CALCIUM 9.1 06/14/2013 0800   ALKPHOS 73 10/07/2013 1439   ALKPHOS 75 04/18/2013 1218   AST 50* 10/07/2013 1439   AST 24 04/18/2013 1218   ALT 31 10/07/2013 1439   ALT 33 04/18/2013 1218   BILITOT 0.27 10/07/2013 1439   BILITOT 0.2* 04/18/2013 1218     Magnesium  1.6  10/07/2013    1.6  09/08/2013    1.7  08/25/2013    1.6  08/11/2013    1.7  08/04/2013      IgG   1040  09/29/2013     335  09/08/2013     464  08/25/2013  IgA   <6  09/29/2013    <7  09/08/2013    <7  08/25/2013  IgM   12  09/29/2013      7  09/18/214    <4  08/25/2013     STUDIES:  No results  found.    ASSESSMENT: 67 y.o. [place] man with a history of well-differentiated lymphocytic lymphoma/ chronic lymphoid leukemia initially diagnosed in 2000, not requiring intervention until 2006; with multiple chromosomal abnormalities.  His treatment history is as follows:  (1) fludarabine/cyclophosphamide/rituximab x5 completed May 2007.   (2) rituximab for 8 doses October 2010, with partial response   (3) Leustatin and ofatumumab weekly x8 July to September 2011 followed by maintenance ofatumumab maintenance ofatumumab every 2 months, with initial response but rising counts September 2012   (4) status-post unrelated donor stem-cell transplant 02/24/2012 at [facility]  (  a) conditioning regimen consisted of fludarabine + TBI at 200 cGy, followed by rituximab x27;  (b) CMV reactivation x3 (patient CMV positive, donor negative), s/p ganciclovir treatment; 3d reactivation August 2013, s/p gancyclovir, with negative PCR mid-September 2013; last gancyclovir dose 10/06/2012 (c) Chronic GVHD: involving gut and skin, treated with steroids, tacrolimus and MMF.  MMF was eventually d/c'd and tacrolimus currently at a dose of 1.5mg  BID (d) atrial fibrillation: resolved on brief amiodarone regimen (e) steroid-induced myopathy: improving  (f) hypomagnesemia: improved after d/c gancyclovir (g) hypogammaglobulinemia: s/p IVIG most recently 01/07/2013. (h) history of elevated triglycerides (606 on 07/14/2012)  (i) adrenal insufficiency: on prednisone and budesonide (j) pancytopenia, improved  (5) restaging studies September 2013  including CT scans, flow cytometry, and bone marrow biopsy, showed no evidence of residual chronic lymphoid leukemia.  (6) recurrent GVHD (skin rash, mouth changes, severe diarrhea and gastric/duodenal/colonic biopsies 11/17/2012 c/w GVHD grade 2) now only remaining sign is bothersome diarrhea; c diff negative x3; continuing current regimen   (7) severe malnutrition -- on  VITAL supplement in addition to regular diet; on Marinol for anorexia  (8) testosterone deficiency--on patch   (9) deconditioning: ongoing REHAB   (10) mild dehydration: encouraged increased po fluids; receives IVF support w magnesium three times weekly  (11) steroid-induced osteoporosis with compression fractures: received pamidronate 12/18/2012. Status post kyphoplasty at L3-4 in June 2014. Still with some chronic back pain controlled with OxyContin and hydrocodone/APAP.  (12) nausea: improved on current meds  (13)  Positive c.diff, 03/08/2013, on Flagyl 500 mg TID x 20 days, then on oral vanco with Questran, showing improvement  (14) hyperkalemia, resolved  (15)  Hypertension, on labetalol, cardizem, lisinopril, and furosemide  (16) steroid induced hyperglycemia, on sterlix and 70/30 insulin   PLAN: Our entire 20 minute appointment today was spent in addressing Sherard's concerns and questions today, discussing his current medications, and coordinating care.   Arther will receive his IV fluids and IV magnesium today as scheduled, and we'll continue to receive this infusion on a weekly basis. We will see him for physical exam and followup every other week, and he has been scheduled through the end of November.   I am making no changes to his current regimen. He'll continue on his current dose of prednisone, 10 mg in the morning and 5 in the evening until I see him again in 2 weeks. On 10/20/2013, we will likely decrease to 10 mg in the morning and 2.5 mg in the evening. We expect that as we decrease his prednisone dose, we will see not only his blood sugars, but also his blood pressure continue to improve.   Terr is able to drive short distances as long as he feels comfortable, but I did advise him against doing so if he is taking the OxyContin and he voices understanding. In fact Ephriam and Alexandria both voice their understanding and agreement with our plan today. As always, they both know to  call with any changes or problems.  He will be going back to [place] in February.  Daleon Willinger PA-C     10/07/2013

## 2013-10-07 NOTE — Telephone Encounter (Signed)
Made correction to schedule per pt request...td

## 2013-10-07 NOTE — Telephone Encounter (Signed)
Per staff message and POF I have scheduled appts.  JMW  

## 2013-10-07 NOTE — Telephone Encounter (Signed)
, °

## 2013-10-08 LAB — IGG, IGA, IGM
IgA: <6 mg/dL — ABNORMAL LOW (ref 68–379)
IgM, Serum: 12 mg/dL — ABNORMAL LOW (ref 41–251)

## 2013-10-13 ENCOUNTER — Other Ambulatory Visit: Payer: Self-pay | Admitting: Lab

## 2013-10-13 ENCOUNTER — Ambulatory Visit (HOSPITAL_BASED_OUTPATIENT_CLINIC_OR_DEPARTMENT_OTHER): Payer: BC Managed Care – PPO

## 2013-10-13 ENCOUNTER — Other Ambulatory Visit (HOSPITAL_BASED_OUTPATIENT_CLINIC_OR_DEPARTMENT_OTHER): Payer: BC Managed Care – PPO

## 2013-10-13 DIAGNOSIS — C9110 Chronic lymphocytic leukemia of B-cell type not having achieved remission: Secondary | ICD-10-CM

## 2013-10-13 DIAGNOSIS — D89811 Chronic graft-versus-host disease: Secondary | ICD-10-CM

## 2013-10-13 DIAGNOSIS — Z9489 Other transplanted organ and tissue status: Secondary | ICD-10-CM

## 2013-10-13 DIAGNOSIS — D809 Immunodeficiency with predominantly antibody defects, unspecified: Secondary | ICD-10-CM

## 2013-10-13 LAB — CBC WITH DIFFERENTIAL/PLATELET
BASO%: 0.2 % (ref 0.0–2.0)
EOS%: 0.2 % (ref 0.0–7.0)
Eosinophils Absolute: 0 10*3/uL (ref 0.0–0.5)
HCT: 36.5 % — ABNORMAL LOW (ref 38.4–49.9)
LYMPH%: 43.7 % (ref 14.0–49.0)
MCH: 31.1 pg (ref 27.2–33.4)
MCHC: 33.4 g/dL (ref 32.0–36.0)
MCV: 93.1 fL (ref 79.3–98.0)
MONO#: 0.7 10*3/uL (ref 0.1–0.9)
MONO%: 9 % (ref 0.0–14.0)
NEUT#: 3.8 10*3/uL (ref 1.5–6.5)
NEUT%: 46.9 % (ref 39.0–75.0)
RBC: 3.92 10*6/uL — ABNORMAL LOW (ref 4.20–5.82)
RDW: 14.1 % (ref 11.0–14.6)

## 2013-10-13 LAB — MAGNESIUM (CC13): Magnesium: 1.6 mg/dl (ref 1.5–2.5)

## 2013-10-13 LAB — COMPREHENSIVE METABOLIC PANEL (CC13)
ALT: 27 U/L (ref 0–55)
Albumin: 3.4 g/dL — ABNORMAL LOW (ref 3.5–5.0)
Alkaline Phosphatase: 71 U/L (ref 40–150)
CO2: 23 mEq/L (ref 22–29)
Creatinine: 1.2 mg/dL (ref 0.7–1.3)
Glucose: 120 mg/dl (ref 70–140)
Potassium: 4.3 mEq/L (ref 3.5–5.1)
Sodium: 134 mEq/L — ABNORMAL LOW (ref 136–145)
Total Protein: 6.4 g/dL (ref 6.4–8.3)

## 2013-10-13 MED ORDER — SODIUM CHLORIDE 0.9 % IJ SOLN
10.0000 mL | INTRAMUSCULAR | Status: DC | PRN
  Administered 2013-10-13 (×2): 10 mL via INTRAVENOUS
  Filled 2013-10-13: qty 10

## 2013-10-13 MED ORDER — SODIUM CHLORIDE 0.9 % IJ SOLN
10.0000 mL | INTRAMUSCULAR | Status: DC | PRN
  Filled 2013-10-13: qty 10

## 2013-10-13 MED ORDER — SODIUM CHLORIDE 0.9 % IV SOLN
Freq: Once | INTRAVENOUS | Status: AC
  Administered 2013-10-13: 10:00:00 via INTRAVENOUS
  Filled 2013-10-13: qty 500

## 2013-10-13 MED ORDER — HEPARIN SOD (PORK) LOCK FLUSH 100 UNIT/ML IV SOLN
500.0000 [IU] | Freq: Once | INTRAVENOUS | Status: AC
  Administered 2013-10-13: 500 [IU] via INTRAVENOUS
  Filled 2013-10-13: qty 5

## 2013-10-13 MED ORDER — SODIUM CHLORIDE 0.9 % IV SOLN
INTRAVENOUS | Status: DC
  Administered 2013-10-13: 09:00:00 via INTRAVENOUS

## 2013-10-13 NOTE — Patient Instructions (Signed)
Hypomagnesemia Magnesium is a common ion (mineral) in the body which is needed for metabolism. It is about how the body handles food and other chemical reactions necessary for life. Only about 2% of the magnesium in our body is found in the blood. When this is low, it is called hypomagnesemia. The blood will measure only a tiny amount of the magnesium in our body. When it is low in our blood, it does not mean that the whole body supply is low. The normal serum concentration ranges from 1.8-2.5 mEq/L. When the level gets to be less than 1.0 mEq/L, a number of problems begin to happen.  CAUSES   Receiving intravenous fluids without magnesium replacement.  Loss of magnesium from the bowel by naso-gastric suction.  Loss of magnesium from nausea and vomiting or severe diarrhea. Any of the inflammatory bowel conditions can cause this.  Abuse of alcohol often leads to low serum magnesium.  An inherited form of magnesium loss happens when the kidneys lose magnesium. This is called familial or primary hypomagnesemia.  Some medications such as diuretics also cause the loss of magnesium. SYMPTOMS  These following problems are worse if the changes in magnesium levels come on suddenly.  Tremor.  Confusion.  Muscle weakness.  Over-sensitive to sights and sounds.  Sensitive reflexes.  Depression.  Muscular fibrillations.  Over-reactivity of the nerves.  Irritability.  Psychosis.  Spasms of the hand muscles.  Tetany (where the muscles go into uncontrollable spasms). DIAGNOSIS  This condition can be diagnosed by blood tests. TREATMENT   In emergency, magnesium can be given intravenously (by vein).  If the condition is less worrisome, it can be corrected by diet. High levels of magnesium are found in green leafy vegetables, peas, beans and nuts among other things. It can also be given through medications by mouth.  If it is being caused by medications, changes can be made.  If  alcohol is a problem, help is available if there are difficulties giving it up. Document Released: 09/03/2005 Document Revised: 03/01/2012 Document Reviewed: 07/28/2008 [employer] Patient Information 2014 [employer].        

## 2013-10-14 ENCOUNTER — Ambulatory Visit: Payer: Self-pay

## 2013-10-14 ENCOUNTER — Other Ambulatory Visit: Payer: Self-pay | Admitting: Lab

## 2013-10-14 LAB — IGG, IGA, IGM
IgA: <6 mg/dL — ABNORMAL LOW (ref 68–379)
IgG (Immunoglobin G), Serum: 749 mg/dL (ref 650–1600)
IgM, Serum: 8 mg/dL — ABNORMAL LOW (ref 41–251)

## 2013-10-14 LAB — TACROLIMUS LEVEL: Tacrolimus Lvl: 7.3 ng/mL (ref 5.0–20.0)

## 2013-10-20 ENCOUNTER — Ambulatory Visit (HOSPITAL_BASED_OUTPATIENT_CLINIC_OR_DEPARTMENT_OTHER): Payer: BC Managed Care – PPO | Admitting: Physician Assistant

## 2013-10-20 ENCOUNTER — Other Ambulatory Visit (HOSPITAL_BASED_OUTPATIENT_CLINIC_OR_DEPARTMENT_OTHER): Payer: BC Managed Care – PPO | Admitting: Lab

## 2013-10-20 ENCOUNTER — Telehealth: Payer: Self-pay | Admitting: *Deleted

## 2013-10-20 ENCOUNTER — Other Ambulatory Visit: Payer: Self-pay | Admitting: *Deleted

## 2013-10-20 ENCOUNTER — Ambulatory Visit (HOSPITAL_BASED_OUTPATIENT_CLINIC_OR_DEPARTMENT_OTHER): Payer: BC Managed Care – PPO

## 2013-10-20 ENCOUNTER — Encounter: Payer: Self-pay | Admitting: Physician Assistant

## 2013-10-20 ENCOUNTER — Telehealth: Payer: Self-pay | Admitting: Oncology

## 2013-10-20 VITALS — BP 133/79 | HR 71 | Temp 98.0°F | Resp 18 | Ht 67.0 in | Wt 175.7 lb

## 2013-10-20 DIAGNOSIS — R197 Diarrhea, unspecified: Secondary | ICD-10-CM

## 2013-10-20 DIAGNOSIS — C9110 Chronic lymphocytic leukemia of B-cell type not having achieved remission: Secondary | ICD-10-CM

## 2013-10-20 DIAGNOSIS — M549 Dorsalgia, unspecified: Secondary | ICD-10-CM

## 2013-10-20 DIAGNOSIS — D89811 Chronic graft-versus-host disease: Secondary | ICD-10-CM

## 2013-10-20 DIAGNOSIS — Z9489 Other transplanted organ and tissue status: Secondary | ICD-10-CM

## 2013-10-20 DIAGNOSIS — G8929 Other chronic pain: Secondary | ICD-10-CM

## 2013-10-20 DIAGNOSIS — I10 Essential (primary) hypertension: Secondary | ICD-10-CM

## 2013-10-20 DIAGNOSIS — D649 Anemia, unspecified: Secondary | ICD-10-CM

## 2013-10-20 DIAGNOSIS — E785 Hyperlipidemia, unspecified: Secondary | ICD-10-CM

## 2013-10-20 DIAGNOSIS — I4891 Unspecified atrial fibrillation: Secondary | ICD-10-CM

## 2013-10-20 DIAGNOSIS — G252 Other specified forms of tremor: Secondary | ICD-10-CM

## 2013-10-20 DIAGNOSIS — A0471 Enterocolitis due to Clostridium difficile, recurrent: Secondary | ICD-10-CM

## 2013-10-20 DIAGNOSIS — D809 Immunodeficiency with predominantly antibody defects, unspecified: Secondary | ICD-10-CM

## 2013-10-20 DIAGNOSIS — R5381 Other malaise: Secondary | ICD-10-CM

## 2013-10-20 DIAGNOSIS — E099 Drug or chemical induced diabetes mellitus without complications: Secondary | ICD-10-CM

## 2013-10-20 DIAGNOSIS — D801 Nonfamilial hypogammaglobulinemia: Secondary | ICD-10-CM

## 2013-10-20 LAB — IGG, IGA, IGM
IgA: <7 mg/dL — ABNORMAL LOW (ref 68–379)
IgM, Serum: 8 mg/dL — ABNORMAL LOW (ref 41–251)

## 2013-10-20 LAB — COMPREHENSIVE METABOLIC PANEL (CC13)
ALT: 33 U/L (ref 0–55)
AST: 30 U/L (ref 5–34)
Albumin: 3.5 g/dL (ref 3.5–5.0)
Alkaline Phosphatase: 70 U/L (ref 40–150)
Anion Gap: 10 mEq/L (ref 3–11)
CO2: 21 mEq/L — ABNORMAL LOW (ref 22–29)
Calcium: 9.3 mg/dL (ref 8.4–10.4)
Glucose: 111 mg/dl (ref 70–140)
Potassium: 4.7 mEq/L (ref 3.5–5.1)
Sodium: 136 mEq/L (ref 136–145)
Total Bilirubin: 0.27 mg/dL (ref 0.20–1.20)
Total Protein: 6.3 g/dL — ABNORMAL LOW (ref 6.4–8.3)

## 2013-10-20 LAB — CBC WITH DIFFERENTIAL/PLATELET
BASO%: 0.3 % (ref 0.0–2.0)
Basophils Absolute: 0 10*3/uL (ref 0.0–0.1)
Eosinophils Absolute: 0 10*3/uL (ref 0.0–0.5)
HGB: 12.4 g/dL — ABNORMAL LOW (ref 13.0–17.1)
NEUT#: 3.6 10*3/uL (ref 1.5–6.5)
Platelets: 161 10*3/uL (ref 140–400)
RDW: 14.3 % (ref 11.0–14.6)
lymph#: 3 10*3/uL (ref 0.9–3.3)

## 2013-10-20 LAB — MAGNESIUM (CC13): Magnesium: 1.7 mg/dl (ref 1.5–2.5)

## 2013-10-20 MED ORDER — HEPARIN SOD (PORK) LOCK FLUSH 100 UNIT/ML IV SOLN
250.0000 [IU] | INTRAVENOUS | Status: AC | PRN
  Administered 2013-10-20: 250 [IU]
  Filled 2013-10-20: qty 5

## 2013-10-20 MED ORDER — SODIUM CHLORIDE 0.9 % IJ SOLN
10.0000 mL | INTRAMUSCULAR | Status: AC | PRN
  Administered 2013-10-20: 10 mL
  Filled 2013-10-20: qty 10

## 2013-10-20 MED ORDER — SODIUM CHLORIDE 0.9 % IJ SOLN
10.0000 mL | INTRAMUSCULAR | Status: DC | PRN
  Filled 2013-10-20: qty 10

## 2013-10-20 MED ORDER — SODIUM CHLORIDE 0.9 % IV SOLN
Freq: Once | INTRAVENOUS | Status: AC
  Administered 2013-10-20: 11:00:00 via INTRAVENOUS
  Filled 2013-10-20: qty 500

## 2013-10-20 NOTE — Patient Instructions (Signed)
Hypomagnesemia Magnesium is a common ion (mineral) in the body which is needed for metabolism. It is about how the body handles food and other chemical reactions necessary for life. Only about 2% of the magnesium in our body is found in the blood. When this is low, it is called hypomagnesemia. The blood will measure only a tiny amount of the magnesium in our body. When it is low in our blood, it does not mean that the whole body supply is low. The normal serum concentration ranges from 1.8-2.5 mEq/L. When the level gets to be less than 1.0 mEq/L, a number of problems begin to happen.  CAUSES   Receiving intravenous fluids without magnesium replacement.  Loss of magnesium from the bowel by naso-gastric suction.  Loss of magnesium from nausea and vomiting or severe diarrhea. Any of the inflammatory bowel conditions can cause this.  Abuse of alcohol often leads to low serum magnesium.  An inherited form of magnesium loss happens when the kidneys lose magnesium. This is called familial or primary hypomagnesemia.  Some medications such as diuretics also cause the loss of magnesium. SYMPTOMS  These following problems are worse if the changes in magnesium levels come on suddenly.  Tremor.  Confusion.  Muscle weakness.  Over-sensitive to sights and sounds.  Sensitive reflexes.  Depression.  Muscular fibrillations.  Over-reactivity of the nerves.  Irritability.  Psychosis.  Spasms of the hand muscles.  Tetany (where the muscles go into uncontrollable spasms). DIAGNOSIS  This condition can be diagnosed by blood tests. TREATMENT   In emergency, magnesium can be given intravenously (by vein).  If the condition is less worrisome, it can be corrected by diet. High levels of magnesium are found in green leafy vegetables, peas, beans and nuts among other things. It can also be given through medications by mouth.  If it is being caused by medications, changes can be made.  If  alcohol is a problem, help is available if there are difficulties giving it up. Document Released: 09/03/2005 Document Revised: 03/01/2012 Document Reviewed: 07/28/2008 [employer] Patient Information 2014 [employer].        

## 2013-10-20 NOTE — Telephone Encounter (Signed)
Per staff message and POF I have scheduled appts.  JMW  

## 2013-10-20 NOTE — Progress Notes (Signed)
Chaplain made initial visit. Patient stated he "didn't need anything today."

## 2013-10-20 NOTE — Telephone Encounter (Signed)
This RN called to oupt phx [facility] and gave refill for oral vancomycin.  Per pharmacist- Judie Grieve- vancomycin capsules are now generic with cost just a little more then the liquid.

## 2013-10-20 NOTE — Progress Notes (Signed)
ID: [name]   DOB: Oct 25, 1946  MR#: [id number]  NWG#:[id number]  VHQ:IONG,E DOUGLAS, MD SU: OTHER MD: Karlyn Agee, Romero Belling, Lorina Rabon  CHIEF COMPLAINT:  CLL, status post allogeneic stem cell transplant   HISTORY OF PRESENT ILLNESS: We have very complete records from Dr. Sydnee Levans in [place], and in summary:  The patient was initially diagnosed in August 2000, with a white cell count of 23,600, but normal hemoglobin and platelets, and no significant symptomatology. Over the next several years his white cell count drifted up, and he eventually developed some symptoms of night sweats in particular, leading to treatment with fludarabine, Cytoxan and rituxan for five cycles given between December 2006 and May 2007.  We have CT scans from June 2006, November 2006 and April 2007, and comparing the November 2006 and April 2007 scans, there was near complete response. He had subsequent therapy in [place] as detailed below, but with decreased response, leading to allogeneic stem-cell transplant at the [facility] 02/24/2012.  INTERVAL HISTORY: Malek returns with his wife [name]  for followup of his CLL.  He continues to receive weekly IV fluids with IV magnesium, and is here for his infusion today.  Cray continues to do very well, and his strength is improving.  He continues to receive physical therapy which she has found very helpful. He is eating and drinking well. His blood sugars are better controlled. We are ready to discuss decreasing his prednisone dose, which will likely improve his blood sugars further, and possibly even his blood pressure as well.   Estill continues to have occasional loose stools, but these are well-controlled. He continues on oral vancomycin as before. He's had no fevers, chills, night sweats, abdominal pain, or blood or mucus in the stool.  REVIEW OF SYSTEMS: Jaivyn denies any skin changes or rashes. He still bruises easily but has had no  abnormal bleeding. His appetite is good. He's had no nausea or emesis. He denies any cough, shortness of breath, chest pain, or palpitations. He's had no abnormal headaches or dizziness. He continues to have some chronic back pain for which he takes his pain medications appropriately and affectively. The pain is well-controlled and stable. He denies any new or unusual myalgias, arthralgias, or bony pain, and has had no peripheral neuropathy. The swelling in his feet and ankles is minimal, almost completely resolved.  A detailed review of systems is otherwise stable and noncontributory.   PAST MEDICAL HISTORY: Past Medical History  Diagnosis Date  . 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 Diverticular disease   . Hyperlipidemia   . Obesity   . Hypertension   . Hiatal hernia   . CMV (cytomegalovirus) antibody positive     pre-transplant, with seroconversion x2 pst-transplant  . Right bundle branch block     pre-transplant  . CKD (chronic kidney disease) stage 2, GFR 60-89 ml/min   . Pancytopenia   . Steroid-induced diabetes   . Atrial fibrillation     post-transplant  . Myopathy   . 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 Leukemia, chronic lymphoid   . 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 Diarrhea in adult patient 12/05/2012    Due to active GVHD  . CLL (chronic lymphocytic leukemia) 12/05/2012    Dx 07/1999; started Rx 12/06  AlloBMT 3/13  . Rash of face 12/05/2012    Due to GVHD  .  Hypomagnesemia 01/26/2013    PAST SURGICAL HISTORY: Past Surgical History  Procedure Laterality Date  . Tonsillectomy and adenoidectomy    . Bone marrow transplant    . Flexible sigmoidoscopy  11/17/2012    Procedure: FLEXIBLE SIGMOIDOSCOPY;  Surgeon: Petra Kuba, MD;  Location: [facility] ENDOSCOPY;  Service: Endoscopy;  Laterality: N/A;  Dr Ewing Schlein states will be admitted to rooom 1339 11/16/12   . Esophagogastroduodenoscopy  11/17/2012    Procedure: ESOPHAGOGASTRODUODENOSCOPY (EGD);  Surgeon: Petra Kuba, MD;  Location: [facility];  Service: Endoscopy;  Laterality: N/A;    FAMILY HISTORY Family History  Problem Relation Age of Onset  . Cancer Father    The patient's father died from complications of chronic lymphocytic leukemia at the age of 81.  It had been diagnosed seven years before when he was 68.  The patient is enrolled in a familial chronic lymphocytic leukemia study out of the [facility].  The patient's mother is 46, alive, unfortunately suffering with dementia, and he has a brother, 10, who is otherwise in fair health.   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  His wife of >40 years, [name], is a homemaker.  Their daughter, [name], lives in [place].  She also is a Futures trader.  The patient has an 55 year old grandson and an 79-year-old granddaughter, and that is really the main reason he moved to this area.  He is a International aid/development worker.     ADVANCED DIRECTIVES: In place  HEALTH MAINTENANCE: (Updated 10/07/2013) History  Substance Use Topics  . Smoking status: Never Smoker   . Smokeless tobacco: Never Used  . Alcohol Use: No     Colonoscopy: Nov 2013, Dr. Ewing Schlein  PSA:  Bone density:    Lipid panel: May 2014, elevated    Allergies  Allergen Reactions  . Benadryl [Diphenhydramine Hcl]     "Restless leg syndrome"    Current Outpatient Prescriptions  Medication Sig Dispense Refill  . acyclovir (ZOVIRAX) 400 MG tablet Take 2 tablets (800 mg total) by mouth 2 (two) times daily.  120 tablet  3  . budesonide (ENTOCORT EC) 3 MG 24 hr capsule Take 1 capsule (3 mg total) by mouth 3 (three) times daily.  90 capsule  5  . calcium citrate-vitamin D (CITRACAL+D) 315-200 MG-UNIT per tablet Take 1 tablet by mouth 2 (two) times daily.      .  cholecalciferol (VITAMIN D) 1000 UNITS tablet Take 1,000 Units by mouth every evening.       . diltiazem (CARDIZEM CD) 240 MG 24 hr capsule Take 240 mg by mouth daily.      Marland Kitchen diltiazem (CARDIZEM CD) 240 MG 24 hr capsule TAKE 1 CAPSULE BY MOUTH DAILY  30 capsule  3  . ferrous sulfate 325 (65 FE) MG tablet Take 325 mg by mouth daily with breakfast.      . fluconazole (DIFLUCAN) 100 MG tablet Take 100 mg by mouth daily.      . furosemide (LASIX) 20 MG tablet Take 20 mg by mouth daily.      . Heparin Lock Flush (HEPARIN FLUSH, PORCINE,) 100 UNIT/ML injection 300 Units by Intracatheter route as needed (to flush Hickman port).       Marland Kitchen HYDROcodone-acetaminophen (NORCO) 10-325 MG per tablet Take 1-2 tablets by mouth every 6 (six) hours as needed for pain (breakthrough pain).  60 tablet  0  .  Insulin Aspart Prot & Aspart (NOVOLOG MIX 70/30 FLEXPEN) (70-30) 100 UNIT/ML SUPN Inject 9-18 Units into the skin 2 (two) times daily. Take 18 units before breakfast and 9 units before dinner  10 pen  12  . labetalol (NORMODYNE) 200 MG tablet Take 200 mg by mouth 2 (two) times daily.      . Lidocaine-Hydrocortisone Ace 3-0.5 % KIT Apply 1 application topically as needed (for pain).       Marland Kitchen lisinopril (PRINIVIL,ZESTRIL) 10 MG tablet Take 1 tablet (10 mg total) by mouth every evening.  30 tablet  3  . Multiple Vitamin (MULTIVITAMIN WITH MINERALS) TABS tablet Take 1 tablet by mouth daily.      Marland Kitchen NOVOLOG MIX 70/30 FLEXPEN (70-30) 100 UNIT/ML SUPN INJECT 18 UNITS UNDER THE SKIN IN THE MORNING AND 9 UNITS IN THE EVENING AS DIRECTED  15 mL  1  . omeprazole (PRILOSEC) 20 MG capsule Take 20 mg by mouth daily.      . OxyCODONE (OXYCONTIN) 10 mg T12A 12 hr tablet Take 1 tablet (10 mg total) by mouth every 12 (twelve) hours.  60 tablet  0  . predniSONE (DELTASONE) 10 MG tablet Take 10 mg by mouth daily. Alternate 10 mg with 5 mg daily-per pt      . PROTEIN PO Take 6 g by mouth 3 (three) times daily.      . sertraline (ZOLOFT) 50  MG tablet ALTERNATE TAKING 1 TABLET DAILY AND 2 TABLETS DAILY  90 tablet  3  . Sodium Chloride Flush (NORMAL SALINE FLUSH) 0.9 % SOLN 10 mLs by Intracatheter route as needed (to flush Hickman).       Marland Kitchen sulfamethoxazole-trimethoprim (BACTRIM DS) 800-160 MG per tablet Take 1 tablet by mouth daily.      . tacrolimus (PROGRAF) 0.5 MG capsule Take 1.5 mg by mouth 2 (two) times daily.      Marland Kitchen testosterone (ANDRODERM) 4 MG/24HR PT24 patch Place 1 patch onto the skin daily.      . vancomycin (VANCOCIN) 50 mg/mL oral solution Take 2.5 mLs (125 mg total) by mouth every 6 (six) hours.  140 mL  0  . zinc sulfate 220 MG capsule Take 220 mg by mouth daily.      . cholestyramine (QUESTRAN) 4 G packet Take 1 packet by mouth 3 (three) times daily with meals.      . [DISCONTINUED] insulin aspart (NOVOLOG FLEXPEN) 100 UNIT/ML SOPN FlexPen 18units sq qam, 9units sq qpm, or as directed  15 mL  1   No current facility-administered medications for this visit.   Facility-Administered Medications Ordered in Other Visits  Medication Dose Route Frequency Provider Last Rate Last Dose  . 0.9 %  sodium chloride infusion   Intravenous Continuous Moe Brier G Veida Spira, PA-C 500 mL/hr at 03/12/13 0900    . sodium chloride 0.9 % injection 10 mL  10 mL Intravenous PRN Lowella Dell, MD   10 mL at 08/11/12 1606  . sodium chloride 0.9 % injection 10 mL  10 mL Intracatheter PRN Lowella Dell, MD      . sodium chloride 0.9 % injection 10 mL  10 mL Intracatheter PRN Lowella Dell, MD      . sodium chloride 0.9 % injection 10 mL  10 mL Intracatheter PRN Lowella Dell, MD        OBJECTIVE: Middle-aged white male who appears comfortable and is in no acute distress   Filed Vitals:   10/20/13 0927  BP: 133/79  Pulse: 71  Temp: 98 F (36.7 C)  Resp: 18     Body mass index is 27.51 kg/(m^2).   Filed Weights   10/20/13 0927  Weight: 175 lb 11.2 oz (79.697 kg)  ECOG FS: 1 Remainder of physical exam was deferred today      LAB RESULTS:  CBC    Component Value Date/Time   WBC 7.3 10/20/2013 0918   WBC 7.3 08/18/2013 0725   RBC 3.97* 10/20/2013 0918   RBC 3.60* 08/18/2013 0725   RBC 3.82* 03/16/2013 1400   HGB 12.4* 10/20/2013 0918   HGB 11.2* 08/18/2013 0725   HCT 37.1* 10/20/2013 0918   HCT 33.8* 08/18/2013 0725   PLT 161 10/20/2013 0918   PLT 112* 08/18/2013 0725   MCV 93.5 10/20/2013 0918   MCV 93.9 08/18/2013 0725   MCH 31.2 10/20/2013 0918   MCH 31.1 08/18/2013 0725   MCHC 33.4 10/20/2013 0918   MCHC 33.1 08/18/2013 0725   RDW 14.3 10/20/2013 0918   RDW 14.6 08/18/2013 0725   LYMPHSABS 3.0 10/20/2013 0918   LYMPHSABS 1.4 03/18/2013 0615   MONOABS 0.7 10/20/2013 0918   MONOABS 0.3 03/18/2013 0615   EOSABS 0.0 10/20/2013 0918   EOSABS 0.0 03/18/2013 0615   BASOSABS 0.0 10/20/2013 0918   BASOSABS 0.0 03/18/2013 0615        Chemistry      Component Value Date/Time   NA 136 10/20/2013 0917   NA 131* 06/14/2013 0800   K 4.7 10/20/2013 0917   K 4.7 06/14/2013 0800   CL 101 06/15/2013 1034   CL 97 06/14/2013 0800   CO2 21* 10/20/2013 0917   CO2 24 06/14/2013 0800   BUN 36.8* 10/20/2013 0917   BUN 38* 06/14/2013 0800   CREATININE 1.1 10/20/2013 0917   CREATININE 0.97 06/14/2013 0800      Component Value Date/Time   CALCIUM 9.3 10/20/2013 0917   CALCIUM 9.1 06/14/2013 0800   ALKPHOS 70 10/20/2013 0917   ALKPHOS 75 04/18/2013 1218   AST 30 10/20/2013 0917   AST 24 04/18/2013 1218   ALT 33 10/20/2013 0917   ALT 33 04/18/2013 1218   BILITOT 0.27 10/20/2013 0917   BILITOT 0.2* 04/18/2013 1218     Magnesium  1.7  10/20/2013    1.6  10/13/2013    1.6  10/07/2013    1.6  09/08/2013    1.7  08/25/2013    1.6  08/11/2013    1.7  08/04/2013      IgG    Pending 10/20/2013    749  10/13/2013     879  10/07/2013    1040  09/29/2013     335  09/08/2013      464  08/25/2013  IgA   pending 10/20/2013    <6  10/13/2013    <6  10/07/2013    <6  09/29/2013    <7  09/08/2013    <7  08/25/2013  IgM   Pending 10/20/2013     8  10/13/2013    12  10/07/2013    12  09/29/2013      7  09/18/214    <4  08/25/2013     STUDIES:  No results found.    ASSESSMENT: 67 y.o. [place] man with a history of well-differentiated lymphocytic lymphoma/ chronic lymphoid leukemia initially diagnosed in 2000, not requiring intervention until 2006; with multiple chromosomal abnormalities.  His treatment history is as follows:  (1) fludarabine/cyclophosphamide/rituximab x5 completed May 2007.   (  2) rituximab for 8 doses October 2010, with partial response   (3) Leustatin and ofatumumab weekly x8 July to September 2011 followed by maintenance ofatumumab maintenance ofatumumab every 2 months, with initial response but rising counts September 2012   (4) status-post unrelated donor stem-cell transplant 02/24/2012 at the [facility]  (a) conditioning regimen consisted of fludarabine + TBI at 200 cGy, followed by rituximab x27;  (b) CMV reactivation x3 (patient CMV positive, donor negative), s/p ganciclovir treatment; 3d reactivation August 2013, s/p gancyclovir, with negative PCR mid-September 2013; last gancyclovir dose 10/06/2012 (c) Chronic GVHD: involving gut and skin, treated with steroids, tacrolimus and MMF.  MMF was eventually d/c'd and tacrolimus currently at a dose of 1.5mg  BID (d) atrial fibrillation: resolved on brief amiodarone regimen (e) steroid-induced myopathy: improving  (f) hypomagnesemia: improved after d/c gancyclovir (g) hypogammaglobulinemia: s/p IVIG most recently 01/07/2013. (h) history of elevated triglycerides (606 on 07/14/2012)  (i) adrenal insufficiency: on prednisone and budesonide (j) pancytopenia, improved  (5) restaging studies September 2013  including CT scans, flow cytometry, and bone marrow biopsy, showed no evidence of  residual chronic lymphoid leukemia.  (6) recurrent GVHD (skin rash, mouth changes, severe diarrhea and gastric/duodenal/colonic biopsies 11/17/2012 c/w GVHD grade 2) now only remaining sign is bothersome diarrhea; c diff negative x3; continuing current regimen   (7) severe malnutrition -- on VITAL supplement in addition to regular diet; on Marinol for anorexia  (8) testosterone deficiency--on patch   (9) deconditioning: ongoing REHAB   (10) mild dehydration: encouraged increased po fluids; receives IVF support w magnesium three times weekly  (11) steroid-induced osteoporosis with compression fractures: received pamidronate 12/18/2012. Status post kyphoplasty at L3-4 in June 2014. Still with some chronic back pain controlled with OxyContin and hydrocodone/APAP.  (12) nausea: improved on current meds  (13)  Positive c.diff, 03/08/2013, on Flagyl 500 mg TID x 20 days, then on oral vanco with Questran, showing improvement  (14) hyperkalemia, resolved  (15)  Hypertension, on labetalol, cardizem, lisinopril, and furosemide  (16) steroid induced hyperglycemia, on sterlix and 70/30 insulin   PLAN:  Our entire 20 minute appointment today was spent in addressing Dontario's concerns and questions today, discussing his current medications, and coordinating care.   Tycen will receive his IV fluids and IV magnesium today as scheduled.  These will continue on a weekly basis, and her scheduled out through the beginning of January in order to coordinate his appointments throughout the holiday season. We'll see him approximately every other week for physical exam and followup as before. I will see Aryn again in 2 weeks on November 13, and we are also rechecking his lipid panel prior to that visit due to his history of hyperlipidemia.  We are making no changes to Ulas's current regimen, with the exception of decreasing his prednisone slightly, from 10 mg in the morning and 5 mg at night, to 10 mg in the  morning and 2.5 mg at night. He'll continue on this dose for the next 2-4 weeks before we decrease any further.  Romolo did ask a very interesting questions today. He was originally cold in [place] that he should not garden due to a certain bacteria in the soil. Halbert's hobby is pottery, and he misses being able to work in the clay. He wonders whether or not the same bacteria would be present in pottery clay, and is so, as he still a high risk for infection. He plans to contact his physicians in [place] to ask this question, he also wonders  if perhaps we could refer him to an infectious disease doctor for a consultation due to his immunocompromise status.  Aurther Loft and [place] both voice their understanding and agreement with our plan today. As always, they both know to call with any changes or problems.  He will be going back to [place] in February.  Peirce Deveney PA-C     10/20/2013

## 2013-10-21 ENCOUNTER — Other Ambulatory Visit: Payer: Self-pay | Admitting: Lab

## 2013-10-21 ENCOUNTER — Other Ambulatory Visit: Payer: Self-pay | Admitting: Physician Assistant

## 2013-10-21 ENCOUNTER — Ambulatory Visit: Payer: Self-pay

## 2013-10-21 DIAGNOSIS — C9110 Chronic lymphocytic leukemia of B-cell type not having achieved remission: Secondary | ICD-10-CM

## 2013-10-27 ENCOUNTER — Other Ambulatory Visit: Payer: Self-pay | Admitting: Physician Assistant

## 2013-10-27 ENCOUNTER — Other Ambulatory Visit: Payer: Self-pay | Admitting: Lab

## 2013-10-27 ENCOUNTER — Other Ambulatory Visit: Payer: Self-pay | Admitting: Oncology

## 2013-10-27 ENCOUNTER — Other Ambulatory Visit (HOSPITAL_BASED_OUTPATIENT_CLINIC_OR_DEPARTMENT_OTHER): Payer: BC Managed Care – PPO | Admitting: Lab

## 2013-10-27 ENCOUNTER — Ambulatory Visit (HOSPITAL_BASED_OUTPATIENT_CLINIC_OR_DEPARTMENT_OTHER): Payer: BC Managed Care – PPO

## 2013-10-27 DIAGNOSIS — C9110 Chronic lymphocytic leukemia of B-cell type not having achieved remission: Secondary | ICD-10-CM

## 2013-10-27 DIAGNOSIS — R894 Abnormal immunological findings in specimens from other organs, systems and tissues: Secondary | ICD-10-CM

## 2013-10-27 DIAGNOSIS — A0472 Enterocolitis due to Clostridium difficile, not specified as recurrent: Secondary | ICD-10-CM

## 2013-10-27 DIAGNOSIS — D89811 Chronic graft-versus-host disease: Secondary | ICD-10-CM

## 2013-10-27 DIAGNOSIS — E785 Hyperlipidemia, unspecified: Secondary | ICD-10-CM

## 2013-10-27 DIAGNOSIS — Z9489 Other transplanted organ and tissue status: Secondary | ICD-10-CM

## 2013-10-27 DIAGNOSIS — D849 Immunodeficiency, unspecified: Secondary | ICD-10-CM

## 2013-10-27 DIAGNOSIS — D809 Immunodeficiency with predominantly antibody defects, unspecified: Secondary | ICD-10-CM

## 2013-10-27 DIAGNOSIS — R768 Other specified abnormal immunological findings in serum: Secondary | ICD-10-CM

## 2013-10-27 LAB — CBC WITH DIFFERENTIAL/PLATELET
Basophils Absolute: 0 10*3/uL (ref 0.0–0.1)
Eosinophils Absolute: 0 10*3/uL (ref 0.0–0.5)
HCT: 36.8 % — ABNORMAL LOW (ref 38.4–49.9)
HGB: 12.2 g/dL — ABNORMAL LOW (ref 13.0–17.1)
LYMPH%: 38.1 % (ref 14.0–49.0)
MCV: 92.9 fL (ref 79.3–98.0)
MONO#: 0.7 10*3/uL (ref 0.1–0.9)
MONO%: 8.7 % (ref 0.0–14.0)
NEUT#: 4.2 10*3/uL (ref 1.5–6.5)
NEUT%: 52.9 % (ref 39.0–75.0)
Platelets: 148 10*3/uL (ref 140–400)
RBC: 3.96 10*6/uL — ABNORMAL LOW (ref 4.20–5.82)
RDW: 14.3 % (ref 11.0–14.6)
lymph#: 3.1 10*3/uL (ref 0.9–3.3)

## 2013-10-27 LAB — COMPREHENSIVE METABOLIC PANEL (CC13)
Alkaline Phosphatase: 89 U/L (ref 40–150)
CO2: 19 mEq/L — ABNORMAL LOW (ref 22–29)
Calcium: 9.4 mg/dL (ref 8.4–10.4)
Creatinine: 1.1 mg/dL (ref 0.7–1.3)
Glucose: 129 mg/dl (ref 70–140)
Sodium: 140 mEq/L (ref 136–145)
Total Bilirubin: 0.27 mg/dL (ref 0.20–1.20)
Total Protein: 5.9 g/dL — ABNORMAL LOW (ref 6.4–8.3)

## 2013-10-27 LAB — LIPID PANEL
Cholesterol: 181 mg/dL (ref 0–200)
HDL: 38 mg/dL — ABNORMAL LOW (ref >39)
LDL Cholesterol: 110 mg/dL — ABNORMAL HIGH (ref 0–99)
Total CHOL/HDL Ratio: 4.8 Ratio
VLDL: 33 mg/dL (ref 0–40)

## 2013-10-27 LAB — MAGNESIUM (CC13): Magnesium: 1.6 mg/dl (ref 1.5–2.5)

## 2013-10-27 MED ORDER — SODIUM CHLORIDE 0.9 % IV SOLN
Freq: Once | INTRAVENOUS | Status: AC
  Administered 2013-10-27: 10:00:00 via INTRAVENOUS
  Filled 2013-10-27: qty 500

## 2013-10-27 NOTE — Patient Instructions (Signed)
Hypomagnesemia Magnesium is a common ion (mineral) in the body which is needed for metabolism. It is about how the body handles food and other chemical reactions necessary for life. Only about 2% of the magnesium in our body is found in the blood. When this is low, it is called hypomagnesemia. The blood will measure only a tiny amount of the magnesium in our body. When it is low in our blood, it does not mean that the whole body supply is low. The normal serum concentration ranges from 1.8-2.5 mEq/L. When the level gets to be less than 1.0 mEq/L, a number of problems begin to happen.  CAUSES   Receiving intravenous fluids without magnesium replacement.  Loss of magnesium from the bowel by naso-gastric suction.  Loss of magnesium from nausea and vomiting or severe diarrhea. Any of the inflammatory bowel conditions can cause this.  Abuse of alcohol often leads to low serum magnesium.  An inherited form of magnesium loss happens when the kidneys lose magnesium. This is called familial or primary hypomagnesemia.  Some medications such as diuretics also cause the loss of magnesium. SYMPTOMS  These following problems are worse if the changes in magnesium levels come on suddenly.  Tremor.  Confusion.  Muscle weakness.  Over-sensitive to sights and sounds.  Sensitive reflexes.  Depression.  Muscular fibrillations.  Over-reactivity of the nerves.  Irritability.  Psychosis.  Spasms of the hand muscles.  Tetany (where the muscles go into uncontrollable spasms). DIAGNOSIS  This condition can be diagnosed by blood tests. TREATMENT   In emergency, magnesium can be given intravenously (by vein).  If the condition is less worrisome, it can be corrected by diet. High levels of magnesium are found in green leafy vegetables, peas, beans and nuts among other things. It can also be given through medications by mouth.  If it is being caused by medications, changes can be made.  If  alcohol is a problem, help is available if there are difficulties giving it up. Document Released: 09/03/2005 Document Revised: 03/01/2012 Document Reviewed: 07/28/2008 [employer] Patient Information 2014 [employer].        

## 2013-10-28 ENCOUNTER — Telehealth: Payer: Self-pay | Admitting: Oncology

## 2013-10-28 NOTE — Telephone Encounter (Signed)
, °

## 2013-11-03 ENCOUNTER — Ambulatory Visit (HOSPITAL_BASED_OUTPATIENT_CLINIC_OR_DEPARTMENT_OTHER): Payer: BC Managed Care – PPO

## 2013-11-03 ENCOUNTER — Ambulatory Visit (HOSPITAL_BASED_OUTPATIENT_CLINIC_OR_DEPARTMENT_OTHER): Payer: BC Managed Care – PPO | Admitting: Physician Assistant

## 2013-11-03 ENCOUNTER — Encounter: Payer: Self-pay | Admitting: Physician Assistant

## 2013-11-03 ENCOUNTER — Other Ambulatory Visit (HOSPITAL_BASED_OUTPATIENT_CLINIC_OR_DEPARTMENT_OTHER): Payer: BC Managed Care – PPO

## 2013-11-03 VITALS — BP 115/67 | HR 67 | Temp 98.2°F | Resp 18 | Ht 67.0 in | Wt 182.7 lb

## 2013-11-03 DIAGNOSIS — D849 Immunodeficiency, unspecified: Secondary | ICD-10-CM

## 2013-11-03 DIAGNOSIS — I10 Essential (primary) hypertension: Secondary | ICD-10-CM

## 2013-11-03 DIAGNOSIS — Z9489 Other transplanted organ and tissue status: Secondary | ICD-10-CM

## 2013-11-03 DIAGNOSIS — D809 Immunodeficiency with predominantly antibody defects, unspecified: Secondary | ICD-10-CM

## 2013-11-03 DIAGNOSIS — E860 Dehydration: Secondary | ICD-10-CM

## 2013-11-03 DIAGNOSIS — N390 Urinary tract infection, site not specified: Secondary | ICD-10-CM

## 2013-11-03 DIAGNOSIS — R768 Other specified abnormal immunological findings in serum: Secondary | ICD-10-CM

## 2013-11-03 DIAGNOSIS — E291 Testicular hypofunction: Secondary | ICD-10-CM

## 2013-11-03 DIAGNOSIS — E875 Hyperkalemia: Secondary | ICD-10-CM

## 2013-11-03 DIAGNOSIS — E785 Hyperlipidemia, unspecified: Secondary | ICD-10-CM

## 2013-11-03 DIAGNOSIS — R894 Abnormal immunological findings in specimens from other organs, systems and tissues: Secondary | ICD-10-CM

## 2013-11-03 DIAGNOSIS — C9110 Chronic lymphocytic leukemia of B-cell type not having achieved remission: Secondary | ICD-10-CM

## 2013-11-03 DIAGNOSIS — M810 Age-related osteoporosis without current pathological fracture: Secondary | ICD-10-CM

## 2013-11-03 DIAGNOSIS — E2749 Other adrenocortical insufficiency: Secondary | ICD-10-CM

## 2013-11-03 DIAGNOSIS — R799 Abnormal finding of blood chemistry, unspecified: Secondary | ICD-10-CM

## 2013-11-03 DIAGNOSIS — E099 Drug or chemical induced diabetes mellitus without complications: Secondary | ICD-10-CM

## 2013-11-03 DIAGNOSIS — D89811 Chronic graft-versus-host disease: Secondary | ICD-10-CM

## 2013-11-03 DIAGNOSIS — D649 Anemia, unspecified: Secondary | ICD-10-CM

## 2013-11-03 DIAGNOSIS — G8929 Other chronic pain: Secondary | ICD-10-CM

## 2013-11-03 DIAGNOSIS — E43 Unspecified severe protein-calorie malnutrition: Secondary | ICD-10-CM

## 2013-11-03 LAB — CBC WITH DIFFERENTIAL/PLATELET
Basophils Absolute: 0 10*3/uL (ref 0.0–0.1)
Eosinophils Absolute: 0 10*3/uL (ref 0.0–0.5)
HCT: 37 % — ABNORMAL LOW (ref 38.4–49.9)
HGB: 12 g/dL — ABNORMAL LOW (ref 13.0–17.1)
MCH: 30.5 pg (ref 27.2–33.4)
MCV: 94.1 fL (ref 79.3–98.0)
MONO#: 0.5 10*3/uL (ref 0.1–0.9)
MONO%: 4.7 % (ref 0.0–14.0)
NEUT#: 7.4 10*3/uL — ABNORMAL HIGH (ref 1.5–6.5)
NEUT%: 72.6 % (ref 39.0–75.0)
WBC: 10.2 10*3/uL (ref 4.0–10.3)
lymph#: 2.3 10*3/uL (ref 0.9–3.3)

## 2013-11-03 LAB — URINALYSIS, MICROSCOPIC - CHCC
Bacteria, UA: NEGATIVE
Bilirubin (Urine): NEGATIVE
Blood: NEGATIVE
Glucose: NEGATIVE mg/dL
Ketones: NEGATIVE mg/dL
Leukocyte Esterase: NEGATIVE
Nitrite: NEGATIVE
Protein: NEGATIVE mg/dL
RBC / HPF: NEGATIVE (ref 0–2)
Specific Gravity, Urine: 1.015 (ref 1.003–1.035)
Urobilinogen, UR: 0.2 mg/dL (ref 0.2–1)
WBC, UA: NEGATIVE (ref 0–2)
pH: 6 (ref 4.6–8.0)

## 2013-11-03 LAB — COMPREHENSIVE METABOLIC PANEL (CC13)
ALT: 44 U/L (ref 0–55)
AST: 31 U/L (ref 5–34)
Albumin: 3.8 g/dL (ref 3.5–5.0)
Alkaline Phosphatase: 98 U/L (ref 40–150)
BUN: 44.8 mg/dL — ABNORMAL HIGH (ref 7.0–26.0)
CO2: 20 mEq/L — ABNORMAL LOW (ref 22–29)
Calcium: 9.4 mg/dL (ref 8.4–10.4)
Chloride: 108 mEq/L (ref 98–109)
Potassium: 5.2 mEq/L — ABNORMAL HIGH (ref 3.5–5.1)
Sodium: 138 mEq/L (ref 136–145)

## 2013-11-03 MED ORDER — SODIUM CHLORIDE 0.9 % IV SOLN
Freq: Once | INTRAVENOUS | Status: AC
  Administered 2013-11-03: 15:00:00 via INTRAVENOUS
  Filled 2013-11-03: qty 500

## 2013-11-03 NOTE — Progress Notes (Signed)
ID: [name]   DOB: 1946/10/03  MR#: [id number]  VHQ#:[id number]  UXL:KGMW,N DOUGLAS, MD SU: OTHER MD: Karlyn Agee, Romero Belling, Lorina Rabon  CHIEF COMPLAINT:  CLL, status post allogeneic stem cell transplant   HISTORY OF PRESENT ILLNESS: We have very complete records from Dr. Sydnee Levans in [place], and in summary:  The patient was initially diagnosed in August 2000, with a white cell count of 23,600, but normal hemoglobin and platelets, and no significant symptomatology. Over the next several years his white cell count drifted up, and he eventually developed some symptoms of night sweats in particular, leading to treatment with fludarabine, Cytoxan and rituxan for five cycles given between December 2006 and May 2007.  We have CT scans from June 2006, November 2006 and April 2007, and comparing the November 2006 and April 2007 scans, there was near complete response. He had subsequent therapy in [place] as detailed below, but with decreased response, leading to allogeneic stem-cell transplant at the [facility] 02/24/2012.  INTERVAL HISTORY: Dashun returns with his wife [name]  for followup of his CLL.  He continues to receive weekly IV fluids with IV magnesium, and is here for his infusion today.  Esau continues to do very well, and is looking stronger and healthier every week. He continues to receive physical therapy and his strength is improving. His endurance is improving and he is having an easier time walking.   Samarion continues on oral for a history of recurrent C. difficile. His loose stools have basically resolved, and he is having 2-3 soft bowel movements daily. He's had no blood or mucus in the stool. He denies any abdominal pain, and has had no fevers or chills.   He continues to have some elevated blood pressure readings at home, and his blood sugar varies.  He is due for followup with Dr. Everardo All in the next few weeks. He continues on prednisone,  currently 10 mg in the morning and 2.5 mg in the afternoon.     REVIEW OF SYSTEMS: Cledis denies  night sweats. He's had no unexplained weight loss, and his energy level is improving. He denies any skin changes or rashes. He still bruises easily but has had no abnormal bleeding. His appetite is good. He's had no nausea or emesis. He denies any cough, shortness of breath, chest pain, or palpitations. He's had no abnormal headaches, change in vision  or dizziness. His back pain has improved, and he denies any new or unusual myalgias, arthralgias, or bony pain. She's had no peripheral swelling and no signs of peripheral neuropathy.  A detailed review of systems is otherwise stable and noncontributory.   PAST MEDICAL HISTORY: Past Medical History  Diagnosis Date  . 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 Diverticular disease   . Hyperlipidemia   . Obesity   . Hypertension   . Hiatal hernia   . CMV (cytomegalovirus) antibody positive     pre-transplant, with seroconversion x2 pst-transplant  . Right bundle branch block     pre-transplant  . CKD (chronic kidney disease) stage 2, GFR 60-89 ml/min   . Pancytopenia   . Steroid-induced diabetes   . Atrial fibrillation     post-transplant  . Myopathy   . 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 Leukemia, chronic lymphoid   . 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 Diarrhea in adult patient 12/05/2012  Due to active GVHD  . CLL (chronic lymphocytic leukemia) 12/05/2012    Dx 07/1999; started Rx 12/06  AlloBMT 3/13  . Rash of face 12/05/2012    Due to GVHD  . Hypomagnesemia 01/26/2013    PAST SURGICAL HISTORY: Past Surgical History  Procedure Laterality Date  . Tonsillectomy and adenoidectomy    . Bone marrow transplant    . Flexible sigmoidoscopy  11/17/2012    Procedure: FLEXIBLE SIGMOIDOSCOPY;  Surgeon: Petra Kuba, MD;   Location: [facility];  Service: Endoscopy;  Laterality: N/A;  Dr Ewing Schlein states will be admitted to rooom 1339 11/16/12  . Esophagogastroduodenoscopy  11/17/2012    Procedure: ESOPHAGOGASTRODUODENOSCOPY (EGD);  Surgeon: Petra Kuba, MD;  Location: [facility];  Service: Endoscopy;  Laterality: N/A;    FAMILY HISTORY Family History  Problem Relation Age of Onset  . Cancer Father    The patient's father died from complications of chronic lymphocytic leukemia at the age of 70.  It had been diagnosed seven years before when he was 29.  The patient is enrolled in a familial chronic lymphocytic leukemia study out of the [employer] [facility] [employer].  The patient's mother is 64, alive, unfortunately suffering with dementia, and he has a brother, 5, who is otherwise in fair health.   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  His wife of >40 years, [name], is a homemaker.  Their daughter, [name], lives in [place].  She also is a Futures trader.  The patient has an 36 year old grandson and an 27-year-old granddaughter, and that is really the main reason he moved to this area.  He is a International aid/development worker.     ADVANCED DIRECTIVES: In place  HEALTH MAINTENANCE: (Updated 11/03/2013) History  Substance Use Topics  . Smoking status: Never Smoker   . Smokeless tobacco: Never Used  . Alcohol Use: No     Colonoscopy: Nov 2013, Dr. Ewing Schlein  PSA:  Bone density:    Lipid panel: May 2014, elevated    Allergies  Allergen Reactions  . Benadryl [Diphenhydramine Hcl]     "Restless leg syndrome"    Current Outpatient Prescriptions  Medication Sig Dispense Refill  . acyclovir (ZOVIRAX) 400 MG tablet Take 2 tablets (800 mg total) by mouth 2 (two) times daily.  120 tablet  3  . budesonide (ENTOCORT EC) 3 MG 24 hr capsule Take 1 capsule (3 mg total) by mouth 3 (three) times daily.  90 capsule  5  .  calcium citrate-vitamin D (CITRACAL+D) 315-200 MG-UNIT per tablet Take 1 tablet by mouth 2 (two) times daily.      . cholecalciferol (VITAMIN D) 1000 UNITS tablet Take 1,000 Units by mouth every evening.       . cholestyramine (QUESTRAN) 4 G packet Take 1 packet by mouth 3 (three) times daily with meals.      Marland Kitchen diltiazem (CARDIZEM CD) 240 MG 24 hr capsule Take 240 mg by mouth daily.      Marland Kitchen diltiazem (CARDIZEM CD) 240 MG 24 hr capsule TAKE 1 CAPSULE BY MOUTH DAILY  30 capsule  3  . ferrous sulfate 325 (65 FE) MG tablet Take 325 mg by mouth daily with breakfast.      . fluconazole (DIFLUCAN) 100 MG tablet Take 100 mg by mouth daily.      . furosemide (LASIX) 20 MG tablet TAKE 1 TABLET BY MOUTH EVERY MORNING  30  tablet  2  . Heparin Lock Flush (HEPARIN FLUSH, PORCINE,) 100 UNIT/ML injection 300 Units by Intracatheter route as needed (to flush Hickman port).       Marland Kitchen HYDROcodone-acetaminophen (NORCO) 10-325 MG per tablet Take 1-2 tablets by mouth every 6 (six) hours as needed for pain (breakthrough pain).  60 tablet  0  . Insulin Aspart Prot & Aspart (NOVOLOG MIX 70/30 FLEXPEN) (70-30) 100 UNIT/ML SUPN Inject 9-18 Units into the skin 2 (two) times daily. Take 18 units before breakfast and 9 units before dinner  10 pen  12  . labetalol (NORMODYNE) 200 MG tablet Take 200 mg by mouth 2 (two) times daily.      . Lidocaine-Hydrocortisone Ace 3-0.5 % KIT Apply 1 application topically as needed (for pain).       Marland Kitchen lisinopril (PRINIVIL,ZESTRIL) 10 MG tablet Take 1 tablet (10 mg total) by mouth every evening.  30 tablet  3  . Multiple Vitamin (MULTIVITAMIN WITH MINERALS) TABS tablet Take 1 tablet by mouth daily.      Marland Kitchen NOVOLOG MIX 70/30 FLEXPEN (70-30) 100 UNIT/ML SUPN INJECT 18 UNITS UNDER THE SKIN IN THE MORNING AND 9 UNITS IN THE EVENING AS DIRECTED  15 mL  1  . omeprazole (PRILOSEC) 20 MG capsule Take 20 mg by mouth daily.      . OxyCODONE (OXYCONTIN) 10 mg T12A 12 hr tablet Take 1 tablet (10 mg total) by  mouth every 12 (twelve) hours.  60 tablet  0  . predniSONE (DELTASONE) 10 MG tablet Take 10 mg by mouth daily. Alternate 10 mg with 5 mg daily-per pt      . PROTEIN PO Take 6 g by mouth 3 (three) times daily.      . sertraline (ZOLOFT) 50 MG tablet ALTERNATE TAKING 1 TABLET DAILY AND 2 TABLETS DAILY  90 tablet  3  . Sodium Chloride Flush (NORMAL SALINE FLUSH) 0.9 % SOLN 10 mLs by Intracatheter route as needed (to flush Hickman).       Marland Kitchen sulfamethoxazole-trimethoprim (BACTRIM DS) 800-160 MG per tablet Take 1 tablet by mouth daily.      . tacrolimus (PROGRAF) 0.5 MG capsule Take 1.5 mg by mouth 2 (two) times daily.      . tacrolimus (PROGRAF) 0.5 MG capsule TAKE 3 CAPSULES BY MOUTH TWO TIMES DAILY  180 capsule  2  . testosterone (ANDRODERM) 4 MG/24HR PT24 patch Place 1 patch onto the skin daily.      . vancomycin (VANCOCIN) 50 mg/mL oral solution Take 2.5 mLs (125 mg total) by mouth every 6 (six) hours.  140 mL  0  . zinc sulfate 220 MG capsule Take 220 mg by mouth daily.      . [DISCONTINUED] insulin aspart (NOVOLOG FLEXPEN) 100 UNIT/ML SOPN FlexPen 18units sq qam, 9units sq qpm, or as directed  15 mL  1   No current facility-administered medications for this visit.   Facility-Administered Medications Ordered in Other Visits  Medication Dose Route Frequency Provider Last Rate Last Dose  . 0.9 %  sodium chloride infusion   Intravenous Continuous Leverne Amrhein G Anayia Eugene, PA-C 500 mL/hr at 03/12/13 0900    . sodium chloride 0.9 % injection 10 mL  10 mL Intravenous PRN Lowella Dell, MD   10 mL at 08/11/12 1606    OBJECTIVE: Middle-aged white male who appears comfortable and is in no acute distress   Filed Vitals:   11/03/13 1402  BP: 115/67  Pulse: 67  Temp: 98.2 F (36.8  C)  Resp: 18     Body mass index is 28.61 kg/(m^2).  ECOG: 1 Filed Weights   11/03/13 1402  Weight: 182 lb 11.2 oz (82.872 kg)   Physical Exam: HEENT:  Sclerae anicteric.  Oropharynx clear. No ulcerations noted. NODES:  No  cervical, supraclavicular, or axillary lymphadenopathy palpated.  LUNGS:  Clear to auscultation bilaterally.  No wheezes or rhonchi. Good excursion bilaterally. HEART:  Regular rate and rhythm. No murmur  ABDOMEN:  Soft, nontender.  Positive bowel sounds. He does have a ventral hernia, nonpainful. MSK:  No focal spinal tenderness to palpation. Good range of motion in the extremities. EXTREMITIES:  No peripheral edema.   NEURO:  Nonfocal. Well oriented.  Positive affect.    LAB RESULTS:  CBC    Component Value Date/Time   WBC 10.2 11/03/2013 1347   WBC 7.3 08/18/2013 0725   RBC 3.93* 11/03/2013 1347   RBC 3.60* 08/18/2013 0725   RBC 3.82* 03/16/2013 1400   HGB 12.0* 11/03/2013 1347   HGB 11.2* 08/18/2013 0725   HCT 37.0* 11/03/2013 1347   HCT 33.8* 08/18/2013 0725   PLT 151 11/03/2013 1347   PLT 112* 08/18/2013 0725   MCV 94.1 11/03/2013 1347   MCV 93.9 08/18/2013 0725   MCH 30.5 11/03/2013 1347   MCH 31.1 08/18/2013 0725   MCHC 32.4 11/03/2013 1347   MCHC 33.1 08/18/2013 0725   RDW 14.5 11/03/2013 1347   RDW 14.6 08/18/2013 0725   LYMPHSABS 2.3 11/03/2013 1347   LYMPHSABS 1.4 03/18/2013 0615   MONOABS 0.5 11/03/2013 1347   MONOABS 0.3 03/18/2013 0615   EOSABS 0.0 11/03/2013 1347   EOSABS 0.0 03/18/2013 0615   BASOSABS 0.0 11/03/2013 1347   BASOSABS 0.0 03/18/2013 0615        Chemistry      Component Value Date/Time   NA 138 11/03/2013 1348   NA 131* 06/14/2013 0800   K 5.2* 11/03/2013 1348   K 4.7 06/14/2013 0800   CL 101 06/15/2013 1034   CL 97 06/14/2013 0800   CO2 20* 11/03/2013 1348   CO2 24 06/14/2013 0800   BUN 44.8* 11/03/2013 1348   BUN 38* 06/14/2013 0800   CREATININE 1.2 11/03/2013 1348   CREATININE 0.97 06/14/2013 0800      Component Value Date/Time   CALCIUM 9.4 11/03/2013 1348   CALCIUM 9.1 06/14/2013 0800   ALKPHOS 98 11/03/2013 1348   ALKPHOS 75 04/18/2013 1218   AST 31 11/03/2013 1348   AST 24 04/18/2013 1218   ALT 44 11/03/2013 1348   ALT 33 04/18/2013  1218   BILITOT 0.22 11/03/2013 1348   BILITOT 0.2* 04/18/2013 1218     Magnesium  1.8  11/03/2013    1.6  10/27/2013    1.7  10/20/2013    1.6  10/13/2013    1.6  10/07/2013    1.6  09/08/2013    1.7  08/25/2013    1.6  08/11/2013    1.7  08/04/2013      IgG   Pending 11/03/2013     668  10/20/2013    749  10/13/2013     879  10/07/2013    1040  09/29/2013     335  09/08/2013     464  08/25/2013  IgA   pending 11/03/2013    <7  10/20/2013    <6  10/13/2013    <6  10/07/2013    <6  09/29/2013    <7  09/08/2013    <  7  08/25/2013  IgM   Pending 11/03/2013     8  10/20/2013     8  10/13/2013    12  10/07/2013    12  09/29/2013      7  09/18/214    <4  08/25/2013    Lipid Panel     Component Value Date/Time   CHOL 181 10/27/2013 0905   TRIG 163* 10/27/2013 0905   HDL 38* 10/27/2013 0905   CHOLHDL 4.8 10/27/2013 0905   VLDL 33 10/27/2013 0905   LDLCALC 110* 10/27/2013 0905      STUDIES:  No results found.    ASSESSMENT: 67 y.o. [place] man with a history of well-differentiated lymphocytic lymphoma/ chronic lymphoid leukemia initially diagnosed in 2000, not requiring intervention until 2006; with multiple chromosomal abnormalities.  His treatment history is as follows:  (1) fludarabine/cyclophosphamide/rituximab x5 completed May 2007.   (2) rituximab for 8 doses October 2010, with partial response   (3) Leustatin and ofatumumab weekly x8 July to September 2011 followed by maintenance ofatumumab maintenance ofatumumab every 2 months, with initial response but rising counts September 2012   (4) status-post unrelated donor stem[facility]cell [facility]/24/2012 [facility]  (a) conditioning regimen consisted of fludarabine + TBI at 200 cGy, followed by rituximab x27;  (b) CMV reactivation x3 (patient CMV positive, donor negative), s/p ganciclovir treatment; 3d reactivation August 2013, s/p gancyclovir, with negative PCR mid-September 2013; last gancyclovir dose  10/06/2012 (c) Chronic GVHD: involving gut and skin, treated with steroids, tacrolimus and MMF.  MMF was eventually d/c'd and tacrolimus currently at a dose of 1.5mg  BID (d) atrial fibrillation: resolved on brief amiodarone regimen (e) steroid-induced myopathy: improving  (f) hypomagnesemia: improved after d/c gancyclovir (g) hypogammaglobulinemia: s/p IVIG most recently 01/07/2013. (h) history of elevated triglycerides (606 on 07/14/2012)  (i) adrenal insufficiency: on prednisone and budesonide (j) pancytopenia, improved  (5) restaging studies September 2013  including CT scans, flow cytometry, and bone marrow biopsy, showed no evidence of residual chronic lymphoid leukemia.  (6) recurrent GVHD (skin rash, mouth changes, severe diarrhea and gastric/duodenal/colonic biopsies 11/17/2012 c/w GVHD grade 2) now only remaining sign is bothersome diarrhea; c diff negative x3; continuing current regimen   (7) severe malnutrition -- on VITAL supplement in addition to regular diet; on Marinol for anorexia  (8) testosterone deficiency--on patch   (9) deconditioning: ongoing REHAB   (10) mild dehydration: encouraged increased po fluids; receives IVF support w magnesium three times weekly  (11) steroid-induced osteoporosis with compression fractures: received pamidronate 12/18/2012. Status post kyphoplasty at L3-4 in June 2014. Still with some chronic back pain controlled with OxyContin and hydrocodone/APAP.  (12) nausea: improved on current meds  (13)  Positive c.diff, 03/08/2013, on Flagyl 500 mg TID x 20 days, then on oral vanco with Questran, showing improvement  (14) hyperkalemia, resolved  (15)  Hypertension, on labetalol, cardizem, lisinopril, and furosemide  (16) steroid induced hyperglycemia, on sterlix and 70/30 insulin   PLAN: This case was reviewed with Dr. Darnelle Catalan, who has also reviewed Schon's lab reports today.   his neutrophil count is slightly elevated today at 7.4 with a  total white blood cell count on the upper end of normal at 10.2. He is asymptomatic of infection, but we are going to check a urinalysis and culture today to rule out a urinary tract infection. We'll plan on repeating his CBC when he returns in 1 week and we'll follow these labs closely. In the meanwhile, Manny knows to call us  with any fevers, chills, or "suspicious" symptoms.  We will also repeat his metabolic panel next week, following the mild hyperkalemia of 5.2 today. There's been no definite trend in BUN, but this will also be followed. Fortunately, his creatinine remains normal at 1.2 today.  Hines is ready to decrease his prednisone slightly. Over the next 7 days he will alternate, 1 day taking 10 mg and 2.5 mg, the next taking 10 mg only. After this week he will decrease again to only 10 mg in the morning. He'll continue followup of his blood pressure and his blood sugar readings. We are referring him back to see Dr. Everardo All for routine followup in approximately 6 weeks.  I will also mention that Mayford had requested a referral for an infectious disease consult, and he is scheduled to see Dr. Johny Sax next week on November 17.  Kou will continue to have labs, IV fluids, and IV magnesium here on a weekly basis. We will basically see him every 2 weeks, and he is scheduled to see Dr. Darnelle Catalan again on November 26 for followup.  We're making no changes to his current regimen other than the decrease in prednisone.  Aurther Loft and [place] both voice their understanding and agreement with our plan today. As always, they both know to call with any changes or problems.  He will be going back to [place] in February.  Maylene Crocker PA-C     11/03/2013

## 2013-11-03 NOTE — Telephone Encounter (Signed)
appts made and printed. Pt made me aware that he's an established pt of Dr. Everardo All and will call for an appt...td

## 2013-11-03 NOTE — Patient Instructions (Signed)
Hypomagnesemia Magnesium is a common ion (mineral) in the body which is needed for metabolism. It is about how the body handles food and other chemical reactions necessary for life. Only about 2% of the magnesium in our body is found in the blood. When this is low, it is called hypomagnesemia. The blood will measure only a tiny amount of the magnesium in our body. When it is low in our blood, it does not mean that the whole body supply is low. The normal serum concentration ranges from 1.8-2.5 mEq/L. When the level gets to be less than 1.0 mEq/L, a number of problems begin to happen.  CAUSES   Receiving intravenous fluids without magnesium replacement.  Loss of magnesium from the bowel by naso-gastric suction.  Loss of magnesium from nausea and vomiting or severe diarrhea. Any of the inflammatory bowel conditions can cause this.  Abuse of alcohol often leads to low serum magnesium.  An inherited form of magnesium loss happens when the kidneys lose magnesium. This is called familial or primary hypomagnesemia.  Some medications such as diuretics also cause the loss of magnesium. SYMPTOMS  These following problems are worse if the changes in magnesium levels come on suddenly.  Tremor.  Confusion.  Muscle weakness.  Over-sensitive to sights and sounds.  Sensitive reflexes.  Depression.  Muscular fibrillations.  Over-reactivity of the nerves.  Irritability.  Psychosis.  Spasms of the hand muscles.  Tetany (where the muscles go into uncontrollable spasms). DIAGNOSIS  This condition can be diagnosed by blood tests. TREATMENT   In emergency, magnesium can be given intravenously (by vein).  If the condition is less worrisome, it can be corrected by diet. High levels of magnesium are found in green leafy vegetables, peas, beans and nuts among other things. It can also be given through medications by mouth.  If it is being caused by medications, changes can be made.  If  alcohol is a problem, help is available if there are difficulties giving it up. Document Released: 09/03/2005 Document Revised: 03/01/2012 Document Reviewed: 07/28/2008 [employer] Patient Information 2014 [employer].        

## 2013-11-04 LAB — IGG, IGA, IGM
IgA: <6 mg/dL — ABNORMAL LOW (ref 68–379)
IgG (Immunoglobin G), Serum: 538 mg/dL — ABNORMAL LOW (ref 650–1600)
IgM, Serum: 5 mg/dL — ABNORMAL LOW (ref 41–251)

## 2013-11-07 ENCOUNTER — Encounter: Payer: Self-pay | Admitting: Infectious Diseases

## 2013-11-07 ENCOUNTER — Ambulatory Visit (INDEPENDENT_AMBULATORY_CARE_PROVIDER_SITE_OTHER): Payer: BC Managed Care – PPO | Admitting: Infectious Diseases

## 2013-11-07 VITALS — BP 149/77 | HR 75 | Temp 98.0°F | Ht 67.0 in | Wt 189.0 lb

## 2013-11-07 DIAGNOSIS — R894 Abnormal immunological findings in specimens from other organs, systems and tissues: Secondary | ICD-10-CM

## 2013-11-07 DIAGNOSIS — R768 Other specified abnormal immunological findings in serum: Secondary | ICD-10-CM

## 2013-11-07 DIAGNOSIS — C9110 Chronic lymphocytic leukemia of B-cell type not having achieved remission: Secondary | ICD-10-CM

## 2013-11-07 DIAGNOSIS — R7689 Other specified abnormal immunological findings in serum: Secondary | ICD-10-CM

## 2013-11-07 DIAGNOSIS — R197 Diarrhea, unspecified: Secondary | ICD-10-CM

## 2013-11-07 DIAGNOSIS — E860 Dehydration: Secondary | ICD-10-CM

## 2013-11-07 NOTE — Assessment & Plan Note (Signed)
We discussed his findings on CT. He has some symptoms and CT would suggest that he has constipation which has led to overflow of loose stool around his hardened stool. Again, I suggested increasing his fluid intake for this. As well he could followup with gastroenterology regarding this (he has seen them before).

## 2013-11-07 NOTE — Assessment & Plan Note (Addendum)
And I comment his white count spoke at length about his questions regarding his possible infectious complications. 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 With regards to his history of CMV, we spoke that the upcoming period well his steroids are tapered he'll be critical to finding out whether or not his CMV is still active. Regards to continuing on his opportunistic infection prophylaxis, he has passed one year for his transplant. He has some concerns regarding his white blood cell count one of the 10.5. He had an increase percentage of neutrophils with this and his lymphocytes were normal. I reassured he and his wife that his white count was still within the normal range and that isn't as I cannot hadn't increased was reassuring regarding the possibility of relapse of his CLL.  To see him back on an as needed basis. I explained this to he and his wife.

## 2013-11-07 NOTE — Assessment & Plan Note (Signed)
Suggested he could increase his oral fluid intake. His increasing BUN relative to history and suggest that he may be dehydrated. He comments under diarrhea

## 2013-11-07 NOTE — Progress Notes (Signed)
  Subjective:    Patient ID: Timothy [name], male    DOB: January 05, 1946, 67 y.o.   MRN: [id number]  HPI 67 yo M with CLL diagnosed in August 2000, with a white cell count of 23,600, and eventually required treatment with fludarabine, Cytoxan and rituxan for five cycles given between December 2006 and May 2007. He underwent further tx in [place], then allogeneic stem-cell transplant at the [facility] 02/24/2012. His course was further complicated by CMV reactivations (last treated October 2013), chronic GVHD, and C diff (treated 02-2013).  Here today with questions about his GVHD. Also has questions about doing pottery and possibility that he could get aspergillus from this.  Is taking bactrim, fluconazole and acyclovir for prophylaxis.   With regards to his GVHD, his previous manifestations have been diarrhea. This is overlayed by his hx of CMV and C diff. Now having 2-3 BM/day. No incontinence. Stool is formed but soft. Only problem with his BM is that he goes tid and one of these is at 2-4am. He also is very constipated.  Had CT 07-2013: 1. Distended gallbladder with gallbladder wall thickening,  gallstones and new common bile duct dilatation. Recommend  correlation with liver function studies. Abdominal ultrasound may  be helpful for further evaluation and to assess for acute  cholecystitis.  2. No abdominal/pelvic lymphadenopathy and no inguinal  lymphadenopathy.  3. Moderate to large amount of stool throughout the colon  suggesting constipation.  4. New anterior abdominal wall hernia near the umbilicus  containing fat.  5. Interval decrease in size of the spleen.  6. Stable vertebral body fractures and vertebral augmentation as  and stable right iliac bone lesion.   Review of Systems  Constitutional: Negative for fever, chills, appetite change and unexpected weight change.  Gastrointestinal: Positive for constipation. Negative for diarrhea.       Objective:   Physical Exam  Constitutional: He appears well-developed and well-nourished.  HENT:  Mouth/Throat: No oropharyngeal exudate.  Eyes: EOM are normal. Pupils are equal, round, and reactive to light.  Neck: Neck supple.  Cardiovascular: Normal rate, regular rhythm and normal heart sounds.   Pulmonary/Chest: Effort normal and breath sounds normal.  Abdominal: Soft. Bowel sounds are normal. He exhibits no distension. There is no tenderness.  Lymphadenopathy:    He has no cervical adenopathy.    He has no axillary adenopathy.          Assessment & Plan:

## 2013-11-07 NOTE — Assessment & Plan Note (Signed)
We spoke about his previous CMV. He does not have signs or symptoms to suggest that this is currently active. His blood cell count, hemoglobin and hematocrit are all normal. His liver function tests have been normal. He has had some transient elevations however they have been less than 100.

## 2013-11-10 ENCOUNTER — Ambulatory Visit (HOSPITAL_BASED_OUTPATIENT_CLINIC_OR_DEPARTMENT_OTHER): Payer: BC Managed Care – PPO

## 2013-11-10 ENCOUNTER — Other Ambulatory Visit (HOSPITAL_BASED_OUTPATIENT_CLINIC_OR_DEPARTMENT_OTHER): Payer: BC Managed Care – PPO

## 2013-11-10 DIAGNOSIS — Z9489 Other transplanted organ and tissue status: Secondary | ICD-10-CM

## 2013-11-10 DIAGNOSIS — C9110 Chronic lymphocytic leukemia of B-cell type not having achieved remission: Secondary | ICD-10-CM

## 2013-11-10 DIAGNOSIS — D849 Immunodeficiency, unspecified: Secondary | ICD-10-CM

## 2013-11-10 DIAGNOSIS — D89811 Chronic graft-versus-host disease: Secondary | ICD-10-CM

## 2013-11-10 LAB — CBC WITH DIFFERENTIAL/PLATELET
BASO%: 0.5 % (ref 0.0–2.0)
Basophils Absolute: 0 10*3/uL (ref 0.0–0.1)
EOS%: 0.4 % (ref 0.0–7.0)
HGB: 12.4 g/dL — ABNORMAL LOW (ref 13.0–17.1)
MCH: 30.8 pg (ref 27.2–33.4)
MCHC: 32.1 g/dL (ref 32.0–36.0)
MCV: 95.7 fL (ref 79.3–98.0)
MONO%: 10.1 % (ref 0.0–14.0)
NEUT%: 50.2 % (ref 39.0–75.0)
RDW: 14.6 % (ref 11.0–14.6)

## 2013-11-10 LAB — COMPREHENSIVE METABOLIC PANEL (CC13)
ALT: 38 U/L (ref 0–55)
Albumin: 3.6 g/dL (ref 3.5–5.0)
Anion Gap: 10 mEq/L (ref 3–11)
CO2: 19 mEq/L — ABNORMAL LOW (ref 22–29)
Calcium: 9.2 mg/dL (ref 8.4–10.4)
Chloride: 108 mEq/L (ref 98–109)
Glucose: 103 mg/dl (ref 70–140)
Potassium: 4.8 mEq/L (ref 3.5–5.1)
Sodium: 137 mEq/L (ref 136–145)
Total Bilirubin: 0.28 mg/dL (ref 0.20–1.20)
Total Protein: 6 g/dL — ABNORMAL LOW (ref 6.4–8.3)

## 2013-11-10 LAB — MAGNESIUM (CC13): Magnesium: 1.8 mg/dl (ref 1.5–2.5)

## 2013-11-10 MED ORDER — SODIUM CHLORIDE 0.9 % IV SOLN
Freq: Once | INTRAVENOUS | Status: AC
  Administered 2013-11-10: 14:00:00 via INTRAVENOUS
  Filled 2013-11-10: qty 500

## 2013-11-11 ENCOUNTER — Other Ambulatory Visit: Payer: Self-pay | Admitting: *Deleted

## 2013-11-11 MED ORDER — OXYCODONE HCL ER 10 MG PO T12A: 10.0000 mg | EXTENDED_RELEASE_TABLET | Freq: Two times a day (BID) | ORAL | Status: DC

## 2013-11-16 ENCOUNTER — Ambulatory Visit (HOSPITAL_BASED_OUTPATIENT_CLINIC_OR_DEPARTMENT_OTHER): Payer: BC Managed Care – PPO | Admitting: Oncology

## 2013-11-16 ENCOUNTER — Ambulatory Visit (HOSPITAL_BASED_OUTPATIENT_CLINIC_OR_DEPARTMENT_OTHER): Payer: Medicare Other

## 2013-11-16 VITALS — BP 140/76 | HR 71 | Temp 98.0°F | Resp 19 | Ht 67.0 in | Wt 184.2 lb

## 2013-11-16 DIAGNOSIS — E291 Testicular hypofunction: Secondary | ICD-10-CM

## 2013-11-16 DIAGNOSIS — D89811 Chronic graft-versus-host disease: Secondary | ICD-10-CM

## 2013-11-16 DIAGNOSIS — R630 Anorexia: Secondary | ICD-10-CM

## 2013-11-16 DIAGNOSIS — E860 Dehydration: Secondary | ICD-10-CM

## 2013-11-16 DIAGNOSIS — E2749 Other adrenocortical insufficiency: Secondary | ICD-10-CM

## 2013-11-16 DIAGNOSIS — D801 Nonfamilial hypogammaglobulinemia: Secondary | ICD-10-CM

## 2013-11-16 DIAGNOSIS — C9110 Chronic lymphocytic leukemia of B-cell type not having achieved remission: Secondary | ICD-10-CM

## 2013-11-16 DIAGNOSIS — M549 Dorsalgia, unspecified: Secondary | ICD-10-CM

## 2013-11-16 DIAGNOSIS — Z9489 Other transplanted organ and tissue status: Secondary | ICD-10-CM

## 2013-11-16 DIAGNOSIS — E46 Unspecified protein-calorie malnutrition: Secondary | ICD-10-CM

## 2013-11-16 DIAGNOSIS — D849 Immunodeficiency, unspecified: Secondary | ICD-10-CM

## 2013-11-16 LAB — CBC WITH DIFFERENTIAL/PLATELET
BASO%: 0.2 % (ref 0.0–2.0)
Basophils Absolute: 0 10*3/uL (ref 0.0–0.1)
EOS%: 0.5 % (ref 0.0–7.0)
Eosinophils Absolute: 0 10*3/uL (ref 0.0–0.5)
HGB: 12.9 g/dL — ABNORMAL LOW (ref 13.0–17.1)
LYMPH%: 30.3 % (ref 14.0–49.0)
MCH: 31.2 pg (ref 27.2–33.4)
MCHC: 32.8 g/dL (ref 32.0–36.0)
MCV: 94.9 fL (ref 79.3–98.0)
MONO%: 7.7 % (ref 0.0–14.0)
Platelets: 155 10*3/uL (ref 140–400)
RBC: 4.14 10*6/uL — ABNORMAL LOW (ref 4.20–5.82)
RDW: 14.8 % — ABNORMAL HIGH (ref 11.0–14.6)

## 2013-11-16 LAB — COMPREHENSIVE METABOLIC PANEL (CC13)
ALT: 47 U/L (ref 0–55)
AST: 32 U/L (ref 5–34)
Albumin: 3.6 g/dL (ref 3.5–5.0)
Alkaline Phosphatase: 86 U/L (ref 40–150)
Anion Gap: 11 mEq/L (ref 3–11)
BUN: 50.9 mg/dL — ABNORMAL HIGH (ref 7.0–26.0)
Creatinine: 1.2 mg/dL (ref 0.7–1.3)
Potassium: 5.7 mEq/L — ABNORMAL HIGH (ref 3.5–5.1)
Total Protein: 6.2 g/dL — ABNORMAL LOW (ref 6.4–8.3)

## 2013-11-16 MED ORDER — HEPARIN SOD (PORK) LOCK FLUSH 100 UNIT/ML IV SOLN
250.0000 [IU] | Freq: Once | INTRAVENOUS | Status: AC
  Administered 2013-11-16: 250 [IU] via INTRAVENOUS
  Filled 2013-11-16: qty 5

## 2013-11-16 MED ORDER — SODIUM CHLORIDE 0.9 % IV SOLN
Freq: Once | INTRAVENOUS | Status: AC
  Administered 2013-11-16: 16:00:00 via INTRAVENOUS
  Filled 2013-11-16: qty 500

## 2013-11-16 MED ORDER — SODIUM CHLORIDE 0.9 % IJ SOLN
10.0000 mL | Freq: Once | INTRAMUSCULAR | Status: AC
  Administered 2013-11-16: 10 mL via INTRAVENOUS
  Filled 2013-11-16: qty 10

## 2013-11-16 NOTE — Progress Notes (Signed)
ID: Timothy [name]   DOB: [birth year]  MR#: [id number]  NWG#:[id number]  VHQ:IONG,E DOUGLAS, MD SU: OTHER MD: Karlyn Agee, Romero Belling, Lorina Rabon, [name]  CHIEF COMPLAINT:  CLL, status post allogeneic stem cell transplant   HISTORY OF PRESENT ILLNESS: We have very complete records from Dr. Sydnee Levans in [place], and in summary:  The patient was initially diagnosed in August 2000, with a white cell count of 23,600, but normal hemoglobin and platelets, and no significant symptomatology. Over the next several years his white cell count drifted up, and he eventually developed some symptoms of night sweats in particular, leading to treatment with fludarabine, Cytoxan and rituxan for five cycles given between December 2006 and May 2007.  We have CT scans from June 2006, November 2006 and April 2007, and comparing the November 2006 and April 2007 scans, there was near complete response. He had subsequent therapy in [place] as detailed below, but with decreased response, leading to allogeneic stem-cell transplant at the [facility] 02/24/2012.  INTERVAL HISTORY: Timothy [name] returns with his wife Timothy [name]  for followup of his CLL. The interval history is generally stable. He continues to make improvements in rehabilitation. Bowel movements are 2-4 times daily, and soft but formed. His blood pressure was a little bit increased and Dr. Clelia Croft has increased his lisinopril dose. Most recent hemoglobin A1c was 5.2. There have been no skin changes or mouth sores.  REVIEW OF SYSTEMS: Chistopher has had no fevers, drenching sweats, or rash. He sleeps moderately well. His psoriasis is well controlled. His back pain is controlled on current medications. He saw Dr. Ninetta Lights recently and was advised to increased his liquids. He is enjoying his physical therapy and feels like he is making regular if slow progress. 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 A detailed review of systems today was otherwise benign  PAST MEDICAL HISTORY: Past Medical History  Diagnosis Date  . 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 Diverticular disease   . Hyperlipidemia   . Obesity   . Hypertension   . Hiatal hernia   . CMV (cytomegalovirus) antibody positive     pre-transplant, with seroconversion x2 pst-transplant  . Right bundle branch block     pre-transplant  . CKD (chronic kidney disease) stage 2, GFR 60-89 ml/min   . Pancytopenia   . Steroid-induced diabetes   . Atrial fibrillation     post-transplant  . Myopathy   . 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 Leukemia, chronic lymphoid   . 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 Diarrhea in adult patient 12/05/2012    Due to active GVHD  . CLL (chronic lymphocytic leukemia) 12/05/2012    Dx 07/1999; started Rx 12/06  AlloBMT 3/13  . Rash of face 12/05/2012    Due to GVHD  . Hypomagnesemia 01/26/2013    PAST SURGICAL HISTORY: Past Surgical History  Procedure Laterality Date  . Tonsillectomy and adenoidectomy    . Bone marrow transplant    . Flexible sigmoidoscopy  11/17/2012    Procedure: FLEXIBLE SIGMOIDOSCOPY;  Surgeon: Petra Kuba, MD;  Location: [facility];  Service: Endoscopy;  Laterality: N/A;  Dr Ewing Schlein states will be admitted to rooom 1339 11/16/12  . Esophagogastroduodenoscopy  11/17/2012    Procedure: ESOPHAGOGASTRODUODENOSCOPY (EGD);  Surgeon: Petra Kuba, MD;  Location: [facility];  Service: Endoscopy;  Laterality: N/A;    FAMILY HISTORY Family History  Problem Relation Age of Onset  . Cancer Father    The patient's father died from complications of chronic lymphocytic leukemia at the age of 67.  It had been diagnosed seven years before when he was 67.  The patient is enrolled in a familial chronic lymphocytic leukemia study out of the  [employer].  The patient's mother is 38, alive, unfortunately suffering with dementia, and he has a brother, 41, who is otherwise in fair health.   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  His wife of >40 years, Timothy [name], is a homemaker.  Their daughter, Timothy [name], lives in [place].  She also is a Futures trader.  The patient has an 46 year old grandson and an 79-year-old granddaughter, and that is really the main reason he moved to this area.  He is a International aid/development worker.     ADVANCED DIRECTIVES: In place  HEALTH MAINTENANCE: (Updated 11/03/2013) History  Substance Use Topics  . Smoking status: Never Smoker   . Smokeless tobacco: Never Used  . Alcohol Use: No     Colonoscopy: Nov 2013, Dr. Ewing Schlein  PSA:  Bone density:    Lipid panel: May 2014, elevated    Allergies  Allergen Reactions  . Benadryl [Diphenhydramine Hcl]     "Restless leg syndrome"    Current Outpatient Prescriptions  Medication Sig Dispense Refill  . acyclovir (ZOVIRAX) 400 MG tablet Take 2 tablets (800 mg total) by mouth 2 (two) times daily.  120 tablet  3  . budesonide (ENTOCORT EC) 3 MG 24 hr capsule Take 1 capsule (3 mg total) by mouth 3 (three) times daily.  90 capsule  5  . calcium citrate-vitamin D (CITRACAL+D) 315-200 MG-UNIT per tablet Take 1 tablet by mouth 2 (two) times daily.      . cholecalciferol (VITAMIN D) 1000 UNITS tablet Take 1,000 Units by mouth every evening.       . cholestyramine (QUESTRAN) 4 G packet Take 1 packet by mouth 3 (three) times daily with meals.      Marland Kitchen diltiazem (CARDIZEM CD) 240 MG 24 hr capsule TAKE 1 CAPSULE BY MOUTH DAILY  30 capsule  3  . ferrous sulfate 325 (65 FE) MG tablet Take 325 mg by mouth daily with breakfast.      . fluconazole (DIFLUCAN) 100 MG tablet Take 100 mg by mouth daily.      . furosemide (LASIX) 20 MG tablet TAKE 1 TABLET BY MOUTH EVERY  MORNING  30 tablet  2  . Heparin Lock Flush (HEPARIN FLUSH, PORCINE,) 100 UNIT/ML injection 300 Units by Intracatheter route as needed (to flush Hickman port).       Marland Kitchen HYDROcodone-acetaminophen (NORCO) 10-325 MG per tablet Take 1-2 tablets by mouth every 6 (six) hours as needed for pain (breakthrough pain).  60 tablet  0  . Insulin Aspart Prot & Aspart (NOVOLOG MIX 70/30 FLEXPEN) (70-30) 100 UNIT/ML SUPN Inject 9-18 Units into the skin 2 (two) times daily. Take 18 units before breakfast and 9 units before dinner  10 pen  12  . labetalol (NORMODYNE) 200 MG tablet Take 200 mg by mouth 2 (two) times daily.      . Lidocaine-Hydrocortisone Ace 3-0.5 % KIT Apply 1 application topically as needed (for pain).       Marland Kitchen lisinopril (PRINIVIL,ZESTRIL)  10 MG tablet Take 1 tablet (10 mg total) by mouth every evening.  30 tablet  3  . Multiple Vitamin (MULTIVITAMIN WITH MINERALS) TABS tablet Take 1 tablet by mouth daily.      Marland Kitchen omeprazole (PRILOSEC) 20 MG capsule Take 20 mg by mouth daily.      . ONE TOUCH ULTRA TEST test strip       . ONETOUCH DELICA LANCETS 33G MISC       . OxyCODONE (OXYCONTIN) 10 mg T12A 12 hr tablet Take 1 tablet (10 mg total) by mouth every 12 (twelve) hours.  60 tablet  0  . predniSONE (DELTASONE) 10 MG tablet Take 10 mg by mouth daily. Alternate 10 mg with 2.5 mg daily-per pt      . PREVIDENT 5000 PLUS 1.1 % CREA dental cream       . PROTEIN PO Take 6 g by mouth 3 (three) times daily.      . sertraline (ZOLOFT) 50 MG tablet ALTERNATE TAKING 1 TABLET DAILY AND 2 TABLETS DAILY  90 tablet  3  . Sodium Chloride Flush (NORMAL SALINE FLUSH) 0.9 % SOLN 10 mLs by Intracatheter route as needed (to flush Hickman).       Marland Kitchen sulfamethoxazole-trimethoprim (BACTRIM DS) 800-160 MG per tablet Take 1 tablet by mouth daily.      . tacrolimus (PROGRAF) 0.5 MG capsule TAKE 3 CAPSULES BY MOUTH TWO TIMES DAILY  180 capsule  2  . vancomycin (VANCOCIN) 50 mg/mL oral solution Take 2.5 mLs (125 mg total) by mouth  every 6 (six) hours.  140 mL  0  . zinc sulfate 220 MG capsule Take 220 mg by mouth daily.      . [DISCONTINUED] insulin aspart (NOVOLOG FLEXPEN) 100 UNIT/ML SOPN FlexPen 18units sq qam, 9units sq qpm, or as directed  15 mL  1   No current facility-administered medications for this visit.   Facility-Administered Medications Ordered in Other Visits  Medication Dose Route Frequency Provider Last Rate Last Dose  . 0.9 %  sodium chloride infusion   Intravenous Continuous Amy G Berry, PA-C 500 mL/hr at 03/12/13 0900    . sodium chloride 0.9 % injection 10 mL  10 mL Intravenous PRN Lowella Dell, MD   10 mL at 08/11/12 1606    OBJECTIVE: Middle-aged white male who appears stated age 67 Vitals:   11/16/13 1438  BP: 140/76  Pulse: 71  Temp: 98 F (36.7 C)  Resp: 19   Sclerae unicteric, pupils equal and reactive Oropharynx clear and moist-- no thrush or other lesions No cervical or supraclavicular adenopathy Lungs no rales or rhonchi Heart regular rate and rhythm Abd soft, nontender, positive bowel sounds MSK no focal spinal tenderness Neuro: nonfocal, well oriented, positive affect; mild tremor secondary to tacrolimus Skin: Skin appears normal throughout, with no rash, dryness, scalenus, or other findings    LAB RESULTS:  CBC    Component Value Date/Time   WBC 8.5 11/16/2013 1131   WBC 7.3 08/18/2013 0725   RBC 4.14* 11/16/2013 1131   RBC 3.60* 08/18/2013 0725   RBC 3.82* 03/16/2013 1400   HGB 12.9* 11/16/2013 1131   HGB 11.2* 08/18/2013 0725   HCT 39.3 11/16/2013 1131   HCT 33.8* 08/18/2013 0725   PLT 155 11/16/2013 1131   PLT 112* 08/18/2013 0725   MCV 94.9 11/16/2013 1131   MCV 93.9 08/18/2013 0725   MCH 31.2 11/16/2013 1131   MCH 31.1 08/18/2013 0725   MCHC 32.8 11/16/2013 1131  MCHC 33.1 08/18/2013 0725   RDW 14.8* 11/16/2013 1131   RDW 14.6 08/18/2013 0725   LYMPHSABS 2.6 11/16/2013 1131   LYMPHSABS 1.4 03/18/2013 0615   MONOABS 0.7 11/16/2013 1131   MONOABS 0.3  03/18/2013 0615   EOSABS 0.0 11/16/2013 1131   EOSABS 0.0 03/18/2013 0615   BASOSABS 0.0 11/16/2013 1131   BASOSABS 0.0 03/18/2013 0615        Chemistry      Component Value Date/Time   NA 137 11/10/2013 1341   NA 131* 06/14/2013 0800   K 4.8 11/10/2013 1341   K 4.7 06/14/2013 0800   CL 101 06/15/2013 1034   CL 97 06/14/2013 0800   CO2 19* 11/10/2013 1341   CO2 24 06/14/2013 0800   BUN 45.1* 11/10/2013 1341   BUN 38* 06/14/2013 0800   CREATININE 1.0 11/10/2013 1341   CREATININE 0.97 06/14/2013 0800      Component Value Date/Time   CALCIUM 9.2 11/10/2013 1341   CALCIUM 9.1 06/14/2013 0800   ALKPHOS 85 11/10/2013 1341   ALKPHOS 75 04/18/2013 1218   AST 29 11/10/2013 1341   AST 24 04/18/2013 1218   ALT 38 11/10/2013 1341   ALT 33 04/18/2013 1218   BILITOT 0.28 11/10/2013 1341   BILITOT 0.2* 04/18/2013 1218        Lipid Panel     Component Value Date/Time   CHOL 181 10/27/2013 0905   TRIG 163* 10/27/2013 0905   HDL 38* 10/27/2013 0905   CHOLHDL 4.8 10/27/2013 0905   VLDL 33 10/27/2013 0905   LDLCALC 110* 10/27/2013 0905   Results for ELOHIM, BRUNE (MRN [id number]) as of 11/17/2013 09:36  Ref. Range 10/07/2013 13:55 10/13/2013 08:43 10/20/2013 09:17 11/03/2013 13:48 11/16/2013 11:32  IgG (Immunoglobin G), Serum Latest Range: [phone] mg/dL [phone] (L) 956 (L)    Results for JAVONE, YBANEZ (MRN [id number]) as of 11/17/2013 09:36  Ref. Range 10/20/2013 09:17 10/27/2013 09:05 11/03/2013 13:48 11/10/2013 13:41 11/16/2013 11:32  Magnesium Latest Range: 1.5-2.5 mg/dl 1.7 1.6 1.8 1.8 1.9   Tacrolimus Lvl ( 5.0 - 20.0 ng/mL ):  7.3    9.6    10.0    8.5    12.8     STUDIES:  No results found.   ASSESSMENT: 67 y.o. [place] man with a history of well-differentiated lymphocytic lymphoma/ chronic lymphoid leukemia initially diagnosed in 2000, not requiring intervention until 2006; with multiple chromosomal abnormalities.  His treatment history is as follows:  (1)  fludarabine/cyclophosphamide/rituximab x5 completed May 2007.   (2) rituximab for 8 doses October 2010, with partial response   (3) Leustatin and ofatumumab weekly x8 July to September 2011 followed by maintenance ofatumumab maintenance ofatumumab every 2 months, with initial response but rising counts September 2012   (4) status-post unrelated donor stem-cell transplant 02/24/2012 at [facility]  (a) conditioning regimen consisted of fludarabine + TBI at 200 cGy, followed by rituximab x27;  (b) CMV reactivation x3 (patient CMV positive, donor negative), s/p ganciclovir treatment; 3d reactivation August 2013, s/p gancyclovir, with negative PCR mid-September 2013; last gancyclovir dose 10/06/2012 (c) Chronic GVHD: involving gut and skin, treated with steroids, tacrolimus and MMF.  MMF was eventually d/c'd and tacrolimus currently at a dose of 1.5mg  BID (d) atrial fibrillation: resolved on brief amiodarone regimen (e) steroid-induced myopathy: improving  (f) hypomagnesemia: improved after d/c gancyclovir (g) hypogammaglobulinemia: s/p IVIG most recently 01/07/2013. (h) history of elevated triglycerides (606 on 07/14/2012)  (i) adrenal insufficiency: on prednisone and budesonide (j)  pancytopenia,resolved  (5) restaging studies September 2013  including CT scans, flow cytometry, and bone marrow biopsy, showed no evidence of residual chronic lymphoid leukemia.  (6) recurrent GVHD (skin rash, mouth changes, severe diarrhea and gastric/duodenal/colonic biopsies 11/17/2012 c/w GVHD grade 2) : now at most grade 1 GI with no significant skin or mouth changes  (7)  malnutrition -- on VITAL supplement in addition to regular diet; on Marinol for anorexia  (8) testosterone deficiency--on patch   (9) deconditioning: ongoing REHAB   (10) mild dehydration: encouraged increased po fluids; receives IVF support w magnesium weekly  (11) steroid-induced osteoporosis with compression fractures: received  pamidronate 12/18/2012. Status post kyphoplasty at L3-4 in June 2014. Waiting on completion of dental work to start denosumab  (12) chronic back pain controlled with OxyContin and hydrocodone/APAP.  (12) nausea: improved on current meds  (13)  Positive c.diff, 03/08/2013, on Flagyl 500 mg TID x 20 days, then on oral vanco with Questran, showing improvement; still positive when last repeated April 2014  (14) persistently increased BUN  (15)  Hypertension, on labetalol, cardizem, lisinopril, and furosemide  (16) steroid induced hyperglycemia, on sterlix and 70/30 insulin, hemoglobin A1c 5.2 November of 2014  (17) hypertension: Lisinopril increased by Dr. Clelia Croft November 2014  (18) hypogammaglobulinemia-- requiring intermittent supplementation  (19) squamous cell CA in situ removed from left parietal scalp October 2014   PLAN: Qu[place]z is looking remarkably well. I am going to set him up for IVIG at the next visit, which should help carry him through the winter. I will also repeat a C. difficile. (I would want 3 separate C. difficile negative determinations before going off the oral vancomycin).   He has been on prednisone 10 mg daily for one week. We will continue for an additional 3 weeks before dropping to 5 mg in the morning and 2 in half in the evening. I am not making any other changes in his treatment or followup, which include weekly intravenous fluids with magnesium and visits here every other week. We will continue to monitor his tacrolimus level and IVIG on a monthly basis.  He has a return appointment in [place] in March. He knows to call for any problems that may develop in the interim. Lowella Dell, MD      11/16/2013

## 2013-11-17 LAB — IGG, IGA, IGM
IgG (Immunoglobin G), Serum: 398 mg/dL — ABNORMAL LOW (ref 650–1600)
IgM, Serum: 5 mg/dL — ABNORMAL LOW (ref 41–251)

## 2013-11-18 ENCOUNTER — Telehealth: Payer: Self-pay | Admitting: Oncology

## 2013-11-18 NOTE — Telephone Encounter (Signed)
extended duration of 12/11 inf appt due to pof sent 11/27 to add IVIG. pt already on schedule for another 3hr inf time extended to 8hrs. s/w pt he is aware and will get new schedule when he comes in 12/4. appt for 12/4 confirmed

## 2013-11-24 ENCOUNTER — Other Ambulatory Visit (HOSPITAL_BASED_OUTPATIENT_CLINIC_OR_DEPARTMENT_OTHER): Payer: BC Managed Care – PPO

## 2013-11-24 ENCOUNTER — Other Ambulatory Visit: Payer: Self-pay | Admitting: *Deleted

## 2013-11-24 ENCOUNTER — Ambulatory Visit (HOSPITAL_COMMUNITY)
Admission: RE | Admit: 2013-11-24 | Discharge: 2013-11-24 | Disposition: A | Discharge status: Home / Self Care | Payer: BC Managed Care – PPO | Source: Ambulatory Visit | Attending: Oncology | Admitting: Oncology

## 2013-11-24 ENCOUNTER — Ambulatory Visit (HOSPITAL_BASED_OUTPATIENT_CLINIC_OR_DEPARTMENT_OTHER): Payer: BC Managed Care – PPO

## 2013-11-24 DIAGNOSIS — D89811 Chronic graft-versus-host disease: Secondary | ICD-10-CM

## 2013-11-24 DIAGNOSIS — G8929 Other chronic pain: Secondary | ICD-10-CM

## 2013-11-24 DIAGNOSIS — C9110 Chronic lymphocytic leukemia of B-cell type not having achieved remission: Secondary | ICD-10-CM

## 2013-11-24 DIAGNOSIS — Z856 Personal history of leukemia: Secondary | ICD-10-CM | POA: Insufficient documentation

## 2013-11-24 DIAGNOSIS — T8609 Other complications of bone marrow transplant: Secondary | ICD-10-CM

## 2013-11-24 DIAGNOSIS — M25559 Pain in unspecified hip: Secondary | ICD-10-CM | POA: Insufficient documentation

## 2013-11-24 DIAGNOSIS — Z9489 Other transplanted organ and tissue status: Secondary | ICD-10-CM

## 2013-11-24 DIAGNOSIS — IMO0002 Reserved for concepts with insufficient information to code with codable children: Secondary | ICD-10-CM | POA: Insufficient documentation

## 2013-11-24 LAB — COMPREHENSIVE METABOLIC PANEL (CC13)
ALT: 53 U/L (ref 0–55)
Anion Gap: 12 mEq/L — ABNORMAL HIGH (ref 3–11)
BUN: 47.8 mg/dL — ABNORMAL HIGH (ref 7.0–26.0)
CO2: 21 mEq/L — ABNORMAL LOW (ref 22–29)
Calcium: 9.1 mg/dL (ref 8.4–10.4)
Chloride: 107 mEq/L (ref 98–109)
Creatinine: 1.2 mg/dL (ref 0.7–1.3)
Glucose: 176 mg/dl — ABNORMAL HIGH (ref 70–140)

## 2013-11-24 LAB — CBC WITH DIFFERENTIAL/PLATELET
Basophils Absolute: 0 10*3/uL (ref 0.0–0.1)
Eosinophils Absolute: 0 10*3/uL (ref 0.0–0.5)
HCT: 36.9 % — ABNORMAL LOW (ref 38.4–49.9)
HGB: 12 g/dL — ABNORMAL LOW (ref 13.0–17.1)
LYMPH%: 28.8 % (ref 14.0–49.0)
MCV: 94.4 fL (ref 79.3–98.0)
MONO#: 0.4 10*3/uL (ref 0.1–0.9)
NEUT#: 6.1 10*3/uL (ref 1.5–6.5)
NEUT%: 67 % (ref 39.0–75.0)
Platelets: 179 10*3/uL (ref 140–400)
RBC: 3.91 10*6/uL — ABNORMAL LOW (ref 4.20–5.82)
WBC: 9 10*3/uL (ref 4.0–10.3)

## 2013-11-24 MED ORDER — HEPARIN SOD (PORK) LOCK FLUSH 100 UNIT/ML IV SOLN
500.0000 [IU] | Freq: Once | INTRAVENOUS | Status: AC
  Administered 2013-11-24: 250 [IU] via INTRAVENOUS
  Filled 2013-11-24: qty 5

## 2013-11-24 MED ORDER — SODIUM CHLORIDE 0.9 % IJ SOLN
10.0000 mL | INTRAMUSCULAR | Status: DC | PRN
  Administered 2013-11-24: 10 mL via INTRAVENOUS
  Filled 2013-11-24: qty 10

## 2013-11-24 MED ORDER — HYDROCODONE-ACETAMINOPHEN 10-325 MG PO TABS: 1.0000 | ORAL_TABLET | Freq: Four times a day (QID) | ORAL | Status: DC | PRN

## 2013-11-24 MED ORDER — SODIUM CHLORIDE 0.9 % IV SOLN
Freq: Once | INTRAVENOUS | Status: AC
  Administered 2013-11-24: 14:00:00 via INTRAVENOUS
  Filled 2013-11-24: qty 500

## 2013-11-24 NOTE — Patient Instructions (Signed)
Hypomagnesemia Magnesium is a common ion (mineral) in the body which is needed for metabolism. It is about how the body handles food and other chemical reactions necessary for life. Only about 2% of the magnesium in our body is found in the blood. When this is low, it is called hypomagnesemia. The blood will measure only a tiny amount of the magnesium in our body. When it is low in our blood, it does not mean that the whole body supply is low. The normal serum concentration ranges from 1.8-2.5 mEq/L. When the level gets to be less than 1.0 mEq/L, a number of problems begin to happen.  CAUSES   Receiving intravenous fluids without magnesium replacement.  Loss of magnesium from the bowel by naso-gastric suction.  Loss of magnesium from nausea and vomiting or severe diarrhea. Any of the inflammatory bowel conditions can cause this.  Abuse of alcohol often leads to low serum magnesium.  An inherited form of magnesium loss happens when the kidneys lose magnesium. This is called familial or primary hypomagnesemia.  Some medications such as diuretics also cause the loss of magnesium. SYMPTOMS  These following problems are worse if the changes in magnesium levels come on suddenly.  Tremor.  Confusion.  Muscle weakness.  Over-sensitive to sights and sounds.  Sensitive reflexes.  Depression.  Muscular fibrillations.  Over-reactivity of the nerves.  Irritability.  Psychosis.  Spasms of the hand muscles.  Tetany (where the muscles go into uncontrollable spasms). DIAGNOSIS  This condition can be diagnosed by blood tests. TREATMENT   In emergency, magnesium can be given intravenously (by vein).  If the condition is less worrisome, it can be corrected by diet. High levels of magnesium are found in green leafy vegetables, peas, beans and nuts among other things. It can also be given through medications by mouth.  If it is being caused by medications, changes can be made.  If  alcohol is a problem, help is available if there are difficulties giving it up. Document Released: 09/03/2005 Document Revised: 03/01/2012 Document Reviewed: 07/28/2008 [employer] Patient Information 2014 [employer].        

## 2013-11-25 LAB — TACROLIMUS LEVEL: Tacrolimus Lvl: 13.2 ng/mL (ref 5.0–20.0)

## 2013-11-26 ENCOUNTER — Other Ambulatory Visit: Payer: Self-pay | Admitting: Oncology

## 2013-11-27 IMAGING — CT CT ABD-PELV W/ CM
3 of 9 series · 5 of 33 positions shown, 6 images · IV contrast (OMNIPAQUE)
Comparison: 09/02/2012.

CT NECK

CLINICAL DATA: Chronic lymphocytic leukemia.  Status post bone
marrow transplant.

CT NECK, CHEST, ABDOMEN AND PELVIS WITH CONTRAST
TECHNIQUE: Multidetector CT imaging of the neck, chest, abdomen
and pelvis was performed using the standard protocol following the
bolus administration of intravenous contrast.
Contrast: 125mL OMNIPAQUE IOHEXOL 300 MG/ML  SOLN

[Series 2: cap st · axial · 0.85mm/px · z∈[-398,-198]mm · 2 of 121 slices shown, 3 images]
[im 41/121  soft-tissue]
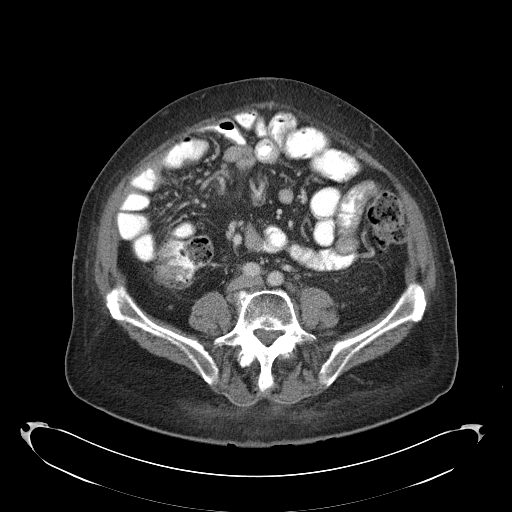
[im 41/121  bone]
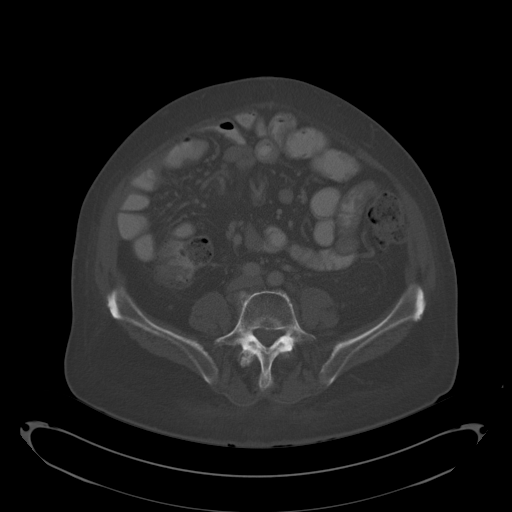
[im 81/121  soft-tissue]
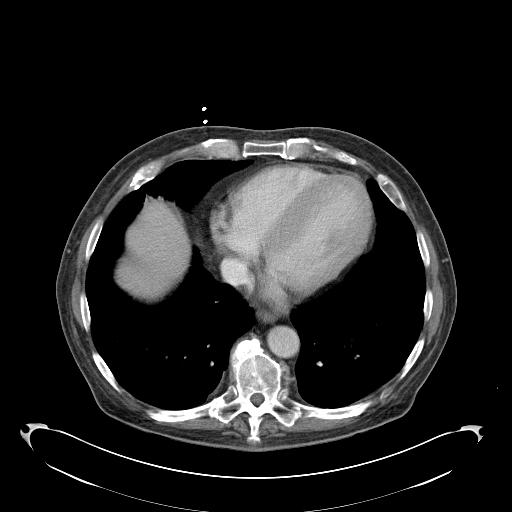

[Series 3: neck · axial · 0.47mm/px · z∈[+4,+78]mm · 2 of 111 slices shown]
[im 37/111  soft-tissue]
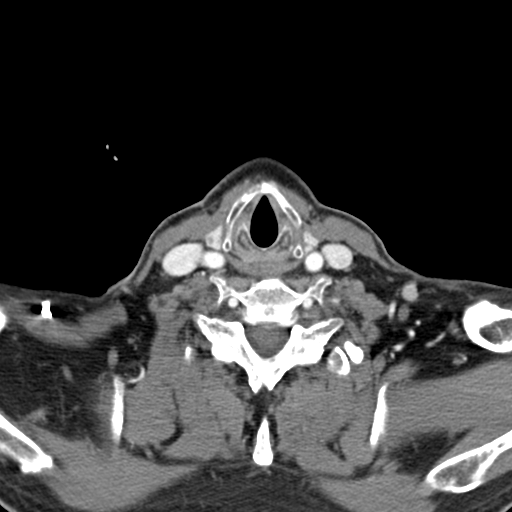
[im 74/111  soft-tissue]
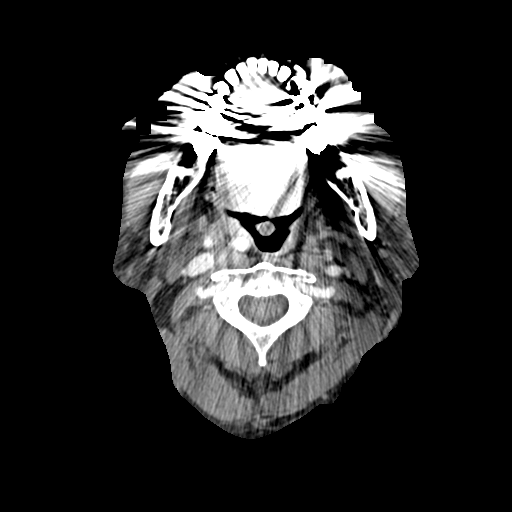

[Series 606: <mpr thick range(4)> · sagittal · 1.18mm/px · 1 of 116 slices shown]
[im 58/116  bone]
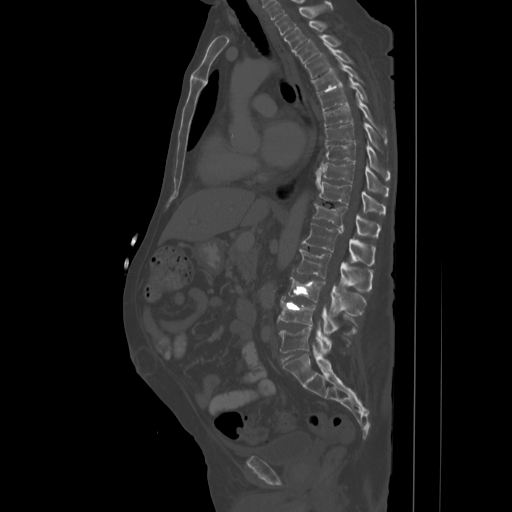

[5 of 33 positions shown; findings below may reference images not displayed]

FINDINGS: There are small scattered normal sized neck nodes
bilaterally.  No adenopathy or mass.  The visualized portion of the
brain appears normal.  Dense atherosclerotic calcifications are
noted.  The skull base is unremarkable.  The paranasal sinuses and
mastoid air cells are clear.

The tongue base and floor the mouth are unremarkable.  The
epiglottis and periglottic fat planes are maintained.  Mass effect
in the region of the right piriform sinus is due to a tortuous
right common carotid artery.

The parotid and submandibular glands appear normal.  The thyroid
gland is normal.  No supraclavicular adenopathy.  The bony
structures are intact.
IMPRESSION: Unremarkable CT examination of the neck.  No adenopathy.
There is marked tortuosity and mild ectasia of the right common
carotid artery which does have mass effect on the right posterior
nasopharynx.

CT CHEST
FINDINGS: The chest wall demonstrates small scattered axillary and
supraclavicular lymph nodes but no mass or adenopathy.  The thyroid
gland is normal.  The bony thorax is grossly intact.  Stable
numerous thoracic and lumbar compression deformities and probable
changes of osseous lymphoma.

The heart is normal in size.  No pericardial effusion.  No
mediastinal or hilar mass or lymphadenopathy.  The esophagus is
grossly normal.  The aorta is normal in caliber.  Mild
atherosclerotic changes.  No dissection.  Scattered coronary artery
calcifications are noted.

Examination of the lung parenchyma demonstrates no acute pulmonary
findings.  No worrisome pulmonary lesions or metastatic pulmonary
disease.  No pleural effusion.  The tracheobronchial tree is
unremarkable.
IMPRESSION: No significant findings in the chest.  No adenopathy or pulmonary
metastatic disease.

CT ABDOMEN AND PELVIS
FINDINGS: The liver is unremarkable.  No focal hepatic lesions or
intrahepatic biliary dilatation.  The gallbladder is mildly
distended and there appears to be mild wall thickening.  A small
left hepatic lobe cyst is stable.  The spleen has decreased in size
since the prior study.  It measures 10 x 10 x 11 cm.  The adrenal
glands and kidneys are unremarkable.

The stomach is not well distended with contrast but no gross
abnormalities are seen.  The duodenum, small bowel and colon are
unremarkable and stable.  No inflammatory changes or mass lesions.
Advanced diverticulosis of the sigmoid colon is again demonstrated.
There is moderate to large amount of stool throughout the colon
suggesting constipation.

No mesenteric or retroperitoneal mass or adenopathy.  Small
scattered lymph nodes are noted.  The aorta demonstrates mild to
moderate atheroma sclerotic calcifications and tortuosity.  No
aneurysm or dissection.  The major branch vessels are patent.

There is a new anterior abdominal wall hernia near the umbilicus
containing fat.

Stable lumbar compression fractures and vertebral augmentation is.
No new lesions.  The bony pelvis is intact. Stable right iliac bone
lesion.
IMPRESSION: 1.  Distended gallbladder with gallbladder wall thickening,
gallstones and new common bile duct dilatation.  Recommend
correlation with liver function studies.  Abdominal ultrasound may
be helpful for further evaluation and to assess for acute
cholecystitis.
2.  No abdominal/pelvic lymphadenopathy and no inguinal
lymphadenopathy.
3.  Moderate to large amount of stool throughout the colon
suggesting constipation.
4.  New anterior abdominal wall hernia near the umbilicus
containing fat.
5.  Interval decrease in size of the spleen.
6.  Stable vertebral body fractures and vertebral augmentation as
and stable right iliac bone lesion.

## 2013-11-28 ENCOUNTER — Telehealth: Payer: Self-pay | Admitting: *Deleted

## 2013-11-28 IMAGING — CT CT BIOPSY
2 series · 4 of 8 positions shown, 7 images · non-contrast
Comparison: none

CLINICAL HISTORY: 67-year-old history of CLL.

[Series 3: add scan 5.0 b70f · axial · 0.74mm/px · z∈[+20,+26]mm · 2 of 4 slices shown, 5 images (1 of 2)]
[im 2/4  soft-tissue]
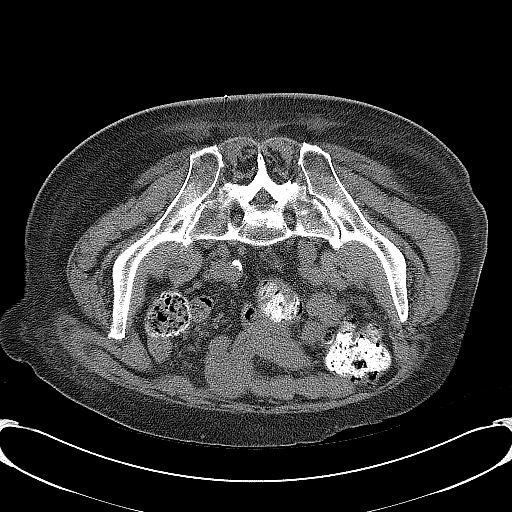
[im 2/4  lung]
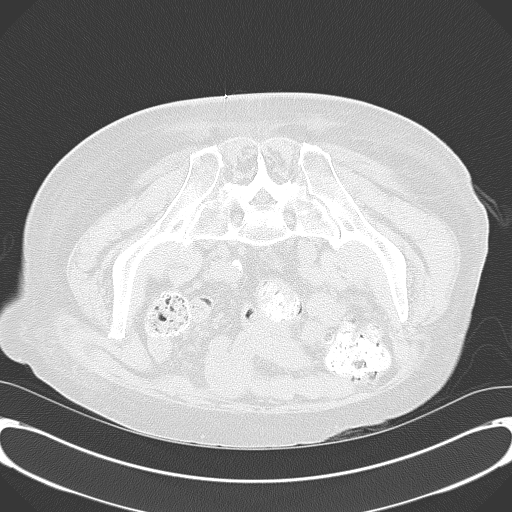
[im 2/4  bone]
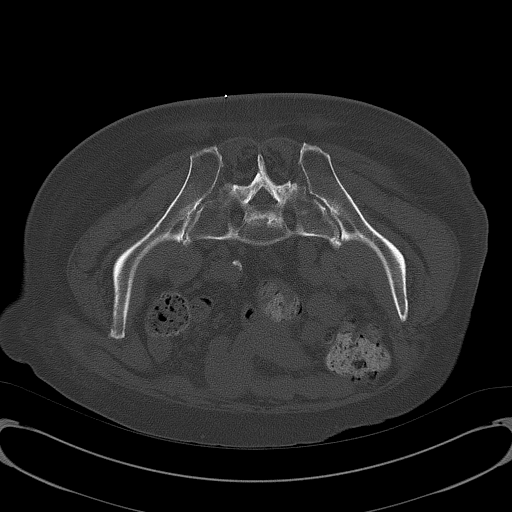
[im 3/4  soft-tissue]
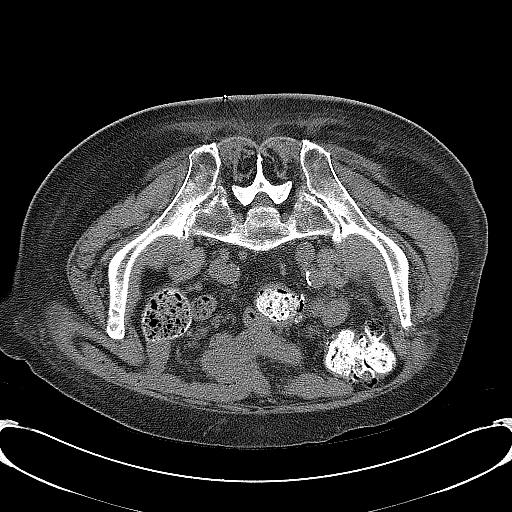
[im 3/4  lung]
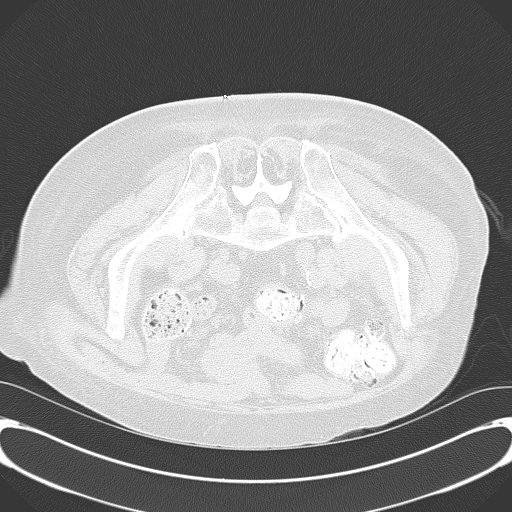

[Series 4: add scan 5.0 b70f · axial · 0.74mm/px · z∈[+20,+26]mm · 2 of 4 slices shown (2 of 2)]
[im 2/4  soft-tissue]
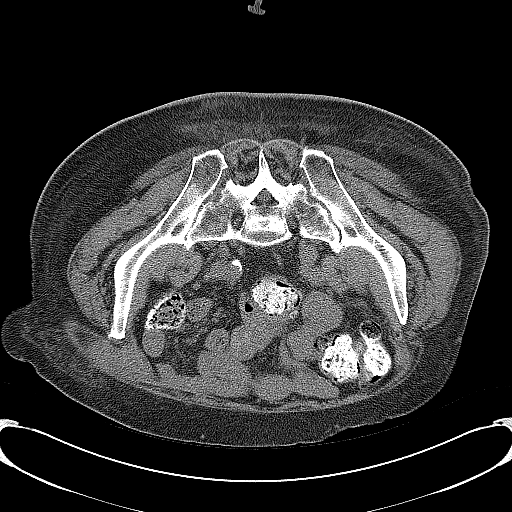
[im 3/4  soft-tissue]
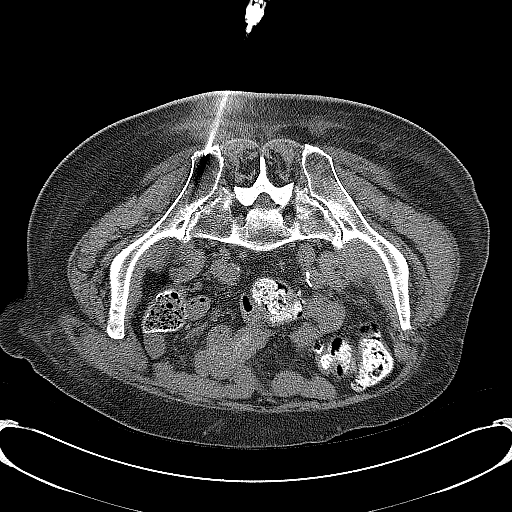

[4 of 8 positions shown; findings below may reference images not displayed]

PROCEDURE(S): CT GUIDED BONE MARROW ASPIRATES AND BIOPSY

 Medications: Versed 2 mg, Fentanyl 150 mcg.A radiology nurse
monitored the patient for moderate sedation.

 Procedure: The procedure was explained to the patient. The risks
and benefits of the procedure were discussed and the patient's
questions were addressed.  Informed consent was obtained from the
patient. The patient was placed prone on CT scan. Images of the
pelvis were obtained. The right side of back was prepped and draped
in sterile fashion. The skin and right posterior iliac bone were
anesthetized with 1% lidocaine.   11 gauge bone needle was directed
into the right iliac bone with CT guidance.  Two aspirates and one
core biopsy obtained.
FINDINGS: Again noted is a lucent area/lesion involving the
inferior aspect of the right iliac bone.  Needle position was
confirmed within the posterior right iliac bone.

Complications: None
IMPRESSION: CT guided bone marrow aspirates and core biopsy.

## 2013-11-28 NOTE — Telephone Encounter (Signed)
Called patient to let him know that his appt has been made to see Dr. Despina Hick or Dr. Charlann Boxer on Dec.24, 2014 at 8:30a or next earliest cancellation. Pt verbalized understanding.

## 2013-12-01 ENCOUNTER — Other Ambulatory Visit (HOSPITAL_BASED_OUTPATIENT_CLINIC_OR_DEPARTMENT_OTHER): Payer: BC Managed Care – PPO

## 2013-12-01 ENCOUNTER — Encounter: Payer: Self-pay | Admitting: Physician Assistant

## 2013-12-01 ENCOUNTER — Telehealth: Payer: Self-pay | Admitting: *Deleted

## 2013-12-01 ENCOUNTER — Other Ambulatory Visit: Payer: Self-pay | Admitting: *Deleted

## 2013-12-01 ENCOUNTER — Telehealth: Payer: Self-pay | Admitting: Oncology

## 2013-12-01 ENCOUNTER — Ambulatory Visit (HOSPITAL_BASED_OUTPATIENT_CLINIC_OR_DEPARTMENT_OTHER): Payer: BC Managed Care – PPO | Admitting: Physician Assistant

## 2013-12-01 ENCOUNTER — Ambulatory Visit (HOSPITAL_BASED_OUTPATIENT_CLINIC_OR_DEPARTMENT_OTHER): Payer: BC Managed Care – PPO

## 2013-12-01 VITALS — BP 100/62 | HR 81 | Temp 98.2°F | Resp 18 | Ht 67.0 in | Wt 190.0 lb

## 2013-12-01 DIAGNOSIS — D801 Nonfamilial hypogammaglobulinemia: Secondary | ICD-10-CM

## 2013-12-01 DIAGNOSIS — C9110 Chronic lymphocytic leukemia of B-cell type not having achieved remission: Secondary | ICD-10-CM

## 2013-12-01 DIAGNOSIS — D89811 Chronic graft-versus-host disease: Secondary | ICD-10-CM

## 2013-12-01 DIAGNOSIS — D809 Immunodeficiency with predominantly antibody defects, unspecified: Secondary | ICD-10-CM

## 2013-12-01 DIAGNOSIS — E099 Drug or chemical induced diabetes mellitus without complications: Secondary | ICD-10-CM

## 2013-12-01 DIAGNOSIS — M25552 Pain in left hip: Secondary | ICD-10-CM

## 2013-12-01 DIAGNOSIS — Z9489 Other transplanted organ and tissue status: Secondary | ICD-10-CM

## 2013-12-01 DIAGNOSIS — R5381 Other malaise: Secondary | ICD-10-CM

## 2013-12-01 DIAGNOSIS — M549 Dorsalgia, unspecified: Secondary | ICD-10-CM

## 2013-12-01 DIAGNOSIS — D849 Immunodeficiency, unspecified: Secondary | ICD-10-CM

## 2013-12-01 DIAGNOSIS — E46 Unspecified protein-calorie malnutrition: Secondary | ICD-10-CM

## 2013-12-01 DIAGNOSIS — G8929 Other chronic pain: Secondary | ICD-10-CM

## 2013-12-01 DIAGNOSIS — D649 Anemia, unspecified: Secondary | ICD-10-CM

## 2013-12-01 HISTORY — DX: Pain in left hip: M25.552

## 2013-12-01 LAB — MAGNESIUM (CC13): Magnesium: 1.7 mg/dl (ref 1.5–2.5)

## 2013-12-01 LAB — CBC WITH DIFFERENTIAL/PLATELET
BASO%: 0.2 % (ref 0.0–2.0)
Eosinophils Absolute: 0.1 10*3/uL (ref 0.0–0.5)
LYMPH%: 31.1 % (ref 14.0–49.0)
MCHC: 32.6 g/dL (ref 32.0–36.0)
MCV: 94 fL (ref 79.3–98.0)
MONO#: 0.9 10*3/uL (ref 0.1–0.9)
MONO%: 9 % (ref 0.0–14.0)
NEUT#: 5.6 10*3/uL (ref 1.5–6.5)
Platelets: 197 10*3/uL (ref 140–400)
RBC: 4.15 10*6/uL — ABNORMAL LOW (ref 4.20–5.82)
RDW: 14.7 % — ABNORMAL HIGH (ref 11.0–14.6)
WBC: 9.4 10*3/uL (ref 4.0–10.3)
lymph#: 2.9 10*3/uL (ref 0.9–3.3)

## 2013-12-01 LAB — COMPREHENSIVE METABOLIC PANEL (CC13)
ALT: 57 U/L — ABNORMAL HIGH (ref 0–55)
AST: 35 U/L — ABNORMAL HIGH (ref 5–34)
Alkaline Phosphatase: 168 U/L — ABNORMAL HIGH (ref 40–150)
BUN: 43.2 mg/dL — ABNORMAL HIGH (ref 7.0–26.0)
Glucose: 164 mg/dl — ABNORMAL HIGH (ref 70–140)
Potassium: 5.3 mEq/L — ABNORMAL HIGH (ref 3.5–5.1)
Total Bilirubin: 0.29 mg/dL (ref 0.20–1.20)
Total Protein: 6 g/dL — ABNORMAL LOW (ref 6.4–8.3)

## 2013-12-01 MED ORDER — IMMUNE GLOBULIN (HUMAN) 20 GM/200ML IV SOLN
80.0000 g | INTRAVENOUS | Status: DC
  Administered 2013-12-01: 80 g via INTRAVENOUS
  Filled 2013-12-01: qty 800

## 2013-12-01 MED ORDER — SODIUM CHLORIDE 0.9 % IV SOLN
Freq: Once | INTRAVENOUS | Status: AC
  Administered 2013-12-01: 11:00:00 via INTRAVENOUS
  Filled 2013-12-01: qty 500

## 2013-12-01 MED ORDER — SODIUM CHLORIDE 0.9 % IV SOLN: Freq: Once | INTRAVENOUS | Status: DC

## 2013-12-01 NOTE — Telephone Encounter (Signed)
Per staff message and POF I have scheduled appts.  JMW  

## 2013-12-01 NOTE — Progress Notes (Signed)
ID: Timothy [name]   DOB: 67-31-1947  MR#: [id number]  WUJ#:[id number]  NFA:OZHY,Q DOUGLAS, MD SU: OTHER MD: Karlyn Agee, Romero Belling, Lorina Rabon, Jefffrey Nira Conn  CHIEF COMPLAINT:  CLL, status post allogeneic stem cell transplant   HISTORY OF PRESENT ILLNESS: We have very complete records from Dr. Sydnee Levans in [name], and in summary:  The patient was initially diagnosed in August 2000, with a white cell count of 23,600, but normal hemoglobin and platelets, and no significant symptomatology. Over the next several years his white cell count drifted up, and he eventually developed some symptoms of night sweats in particular, leading to treatment with fludarabine, Cytoxan and rituxan for five cycles given between December 2006 and May 2007.  We have CT scans from June 2006, November 2006 and April 2007, and comparing the November 2006 and April 2007 scans, there was near complete response. He had subsequent therapy in [place] as detailed below, but with decreased response, leading to allogeneic stem-cell transplant at the [facility] 02/24/2012.  INTERVAL HISTORY: Timothy [name] returns with his wife Timothy [name]  for followup of his CLL.  He continues to receive IV fluids with IV magnesium on a weekly basis and is due for his next infusion today. He also receives infusions of IVIG as needed, and this is scheduled for today as well.   Overall, interval history is unremarkable. Timothy [name] has had some increased pain in the left hip which he attributes to "working too hard" at some recent physical therapy sessions. A left hip x-ray was obtained on 11/24/2013 showing only degenerative disease. He has an appointment to see Dr. Doneen Poisson for further assessment next Monday, 12/05/2013.  I will also mention that he recently met with Dr. Ninetta Lights for an infectious disease consult and was very impressed by his visit. 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   REVIEW OF SYSTEMS: Jamerson has had no fevers, chills, drenching sweats, or rashes. He still bruises very easily, but has had no signs of abnormal bleeding. He typically sleeps well, and overall has less fatigue and is feeling stronger. He has noticed some slight hearing loss. He denies any abnormal headaches or dizziness and has had no change in vision. His appetite is great and he denies nausea. He still has some soft stools, but no watery diarrhea. Thess average 2-4 times daily. He has chronic lower back pain, and as noted above recent pain in the left hip. These are both well controlled currently with his pain medication, although he finds he is been taking a little more frequently than he did in the past. He's had no peripheral swelling. He still has a slight tremor. He denies any neuropathy.  A detailed review of systems is otherwise stable and noncontributory.   PAST MEDICAL HISTORY: Past Medical History  Diagnosis Date  . 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 Diverticular disease   . Hyperlipidemia   . Obesity   . Hypertension   . Hiatal hernia   . CMV (cytomegalovirus) antibody positive     pre-transplant, with seroconversion x2 pst-transplant  . Right bundle branch block     pre-transplant  . CKD (chronic kidney disease) stage 2, GFR 60-89 ml/min   . Pancytopenia   . Steroid-induced diabetes   . Atrial fibrillation     post-transplant  . Myopathy   .  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 Leukemia, chronic lymphoid   . 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 Diarrhea in adult patient 12/05/2012    Due to active GVHD  . CLL (chronic lymphocytic leukemia) 12/05/2012    Dx 07/1999; started Rx 67/06  AlloBMT 3/13  . Rash of face 12/05/2012    Due to  GVHD  . Hypomagnesemia 01/26/2013  . Left hip pain 12/01/2013    PAST SURGICAL HISTORY: Past Surgical History  Procedure Laterality Date  . Tonsillectomy and adenoidectomy    . Bone marrow transplant    . Flexible sigmoidoscopy  11/17/2012    Procedure: FLEXIBLE SIGMOIDOSCOPY;  Surgeon: Petra Kuba, MD;  Location: [facility];  Service: Endoscopy;  Laterality: N/A;  Dr Ewing Schlein states will be admitted to rooom 1339 11/16/12  . Esophagogastroduodenoscopy  11/17/2012    Procedure: ESOPHAGOGASTRODUODENOSCOPY (EGD);  Surgeon: Petra Kuba, MD;  Location: [facility];  Service: Endoscopy;  Laterality: N/A;    FAMILY HISTORY Family History  Problem Relation Age of Onset  . Cancer Father    The patient's father died from complications of chronic lymphocytic leukemia at the age of 1.  It had been diagnosed seven years before when he was 27.  The patient is enrolled in a familial chronic lymphocytic leukemia study out of the [employer] [facility] [employer].  The patient's mother is 49, alive, unfortunately suffering with dementia, and he has a brother, 8, who is otherwise in fair health.   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  His wife of >40 years, Timothy [name], is a homemaker.  Their daughter, Timothy [name], lives in [place].  She also is a Futures trader.  The patient has an 66 year old grandson and an 13-year-old granddaughter, and that is really the main reason he moved to this area.  He is a International aid/development worker.     ADVANCED DIRECTIVES: In place  HEALTH MAINTENANCE: (Updated 11/03/2013) History  Substance Use Topics  . Smoking status: Never Smoker   . Smokeless tobacco: Never Used  . Alcohol Use: No     Colonoscopy: Nov 2013, Dr. Ewing Schlein  PSA:  Bone density:    Lipid panel: May 2014, elevated    Allergies  Allergen Reactions  . Benadryl [Diphenhydramine Hcl]     "Restless leg  syndrome"    Current Outpatient Prescriptions  Medication Sig Dispense Refill  . acyclovir (ZOVIRAX) 400 MG tablet Take 2 tablets (800 mg total) by mouth 2 (two) times daily.  120 tablet  3  . B-D UF III MINI PEN NEEDLES 31G X 5 MM MISC       . budesonide (ENTOCORT EC) 3 MG 24 hr capsule Take 1 capsule (3 mg total) by mouth 3 (three) times daily.  90 capsule  5  . calcium citrate-vitamin D (CITRACAL+D) 315-200 MG-UNIT per tablet Take 1 tablet by mouth 2 (two) times daily.      . cholecalciferol (VITAMIN D) 1000 UNITS tablet Take 1,000 Units by mouth every evening.       . diltiazem (CARDIZEM CD) 240 MG 24 hr capsule TAKE 1 CAPSULE BY MOUTH DAILY  30 capsule  3  . ferrous sulfate 325 (65 FE) MG tablet Take 325 mg by mouth daily with breakfast.      . fluconazole (DIFLUCAN) 100 MG tablet Take 100 mg  by mouth daily.      . furosemide (LASIX) 20 MG tablet TAKE 1 TABLET BY MOUTH EVERY MORNING  30 tablet  2  . Heparin Lock Flush (HEPARIN FLUSH, PORCINE,) 100 UNIT/ML injection 300 Units by Intracatheter route as needed (to flush Hickman port).       Marland Kitchen HYDROcodone-acetaminophen (NORCO) 10-325 MG per tablet Take 1-2 tablets by mouth every 6 (six) hours as needed.  120 tablet  0  . Insulin Aspart Prot & Aspart (NOVOLOG MIX 70/30 FLEXPEN) (70-30) 100 UNIT/ML SUPN Inject 9-18 Units into the skin 2 (two) times daily. Take 18 units before breakfast and 9 units before dinner  10 pen  12  . labetalol (NORMODYNE) 200 MG tablet Take 200 mg by mouth 2 (two) times daily.      . Lidocaine-Hydrocortisone Ace 3-0.5 % KIT Apply 1 application topically as needed (for pain).       Marland Kitchen lisinopril (PRINIVIL,ZESTRIL) 10 MG tablet Take 1 tablet (10 mg total) by mouth every evening.  30 tablet  3  . Multiple Vitamin (MULTIVITAMIN WITH MINERALS) TABS tablet Take 1 tablet by mouth daily.      Marland Kitchen omeprazole (PRILOSEC) 20 MG capsule Take 20 mg by mouth daily.      . ONE TOUCH ULTRA TEST test strip       . ONETOUCH DELICA LANCETS  33G MISC       . OxyCODONE (OXYCONTIN) 10 mg T12A 12 hr tablet Take 1 tablet (10 mg total) by mouth every 12 (twelve) hours.  60 tablet  0  . predniSONE (DELTASONE) 10 MG tablet Take 10 mg by mouth daily. Alternate 10 mg with 2.5 mg daily-per pt      . PREVIDENT 5000 PLUS 1.1 % CREA dental cream       . sertraline (ZOLOFT) 50 MG tablet ALTERNATE TAKING 1 TABLET DAILY AND 2 TABLETS DAILY  90 tablet  3  . Sodium Chloride Flush (NORMAL SALINE FLUSH) 0.9 % SOLN 10 mLs by Intracatheter route as needed (to flush Hickman).       Marland Kitchen sulfamethoxazole-trimethoprim (BACTRIM DS) 800-160 MG per tablet Take 1 tablet by mouth daily.      . tacrolimus (PROGRAF) 0.5 MG capsule TAKE 3 CAPSULES BY MOUTH TWO TIMES DAILY  180 capsule  2  . vancomycin (VANCOCIN) 50 mg/mL oral solution Take 2.5 mLs (125 mg total) by mouth every 6 (six) hours.  140 mL  0  . zinc sulfate 220 MG capsule Take 220 mg by mouth daily.      . cholestyramine (QUESTRAN) 4 G packet Take 1 packet by mouth 3 (three) times daily with meals.      Marland Kitchen PROTEIN PO Take 6 g by mouth 3 (three) times daily.      . [DISCONTINUED] insulin aspart (NOVOLOG FLEXPEN) 100 UNIT/ML SOPN FlexPen 18units sq qam, 9units sq qpm, or as directed  15 mL  1   No current facility-administered medications for this visit.   Facility-Administered Medications Ordered in Other Visits  Medication Dose Route Frequency Provider Last Rate Last Dose  . 0.9 %  sodium chloride infusion   Intravenous Continuous Kiani Wurtzel G Weronika Birch, PA-C 500 mL/hr at 03/12/13 0900    . 0.9 %  sodium chloride infusion   Intravenous Once Lowella Dell, MD      . Immune Globulin  10% (PRIVIGEN) SOLN 80 g  80 g Intravenous Q24H Lowella Dell, MD 26 mL/hr at 12/01/13 1205 80 g at 12/01/13 1205  .  sodium chloride 0.9 % injection 10 mL  10 mL Intravenous PRN Lowella Dell, MD   10 mL at 08/11/12 1606    OBJECTIVE: Middle-aged white male who appears stated age and is in no acute distress Filed Vitals:    12/01/13 0916  BP: 100/62  Pulse: 81  Temp: 98.2 F (36.8 C)  Resp: 18  Body mass index is 29.75 kg/(m^2).  ECOG:  1 Filed Weights   12/01/13 0916  Weight: 190 lb (86.183 kg)   Physical Exam: HEENT:  Sclerae anicteric.  Oropharynx clear and moist. LUNGS:  Clear to auscultation bilaterally with good excursion.  No wheezes or rhonchi HEART:  Regular rate and rhythm. No murmur  ABDOMEN:  Soft, nontender.  Positive bowel sounds.  MSK:  No joint swelling. Good range of motion in both the upper and lower extremities.  EXTREMITIES:  Nonpitting pedal edema, equal bilaterally. No upper extremity edema is noted.   SKIN:  Scattered ecchymoses noted on the upper extremities, but improved compared to previous exams. No obvious rashes or skin changes otherwise. NEURO:  Nonfocal. Well oriented.  Appropriate affect.     LAB RESULTS:  CBC    Component Value Date/Time   WBC 9.4 12/01/2013 0858   WBC 7.3 08/18/2013 0725   RBC 4.15* 12/01/2013 0858   RBC 3.60* 08/18/2013 0725   RBC 3.82* 03/16/2013 1400   HGB 12.7* 12/01/2013 0858   HGB 11.2* 08/18/2013 0725   HCT 39.0 12/01/2013 0858   HCT 33.8* 08/18/2013 0725   PLT 197 12/01/2013 0858   PLT 112* 08/18/2013 0725   MCV 94.0 12/01/2013 0858   MCV 93.9 08/18/2013 0725   MCH 30.6 12/01/2013 0858   MCH 31.1 08/18/2013 0725   MCHC 32.6 12/01/2013 0858   MCHC 33.1 08/18/2013 0725   RDW 14.7* 12/01/2013 0858   RDW 14.6 08/18/2013 0725   LYMPHSABS 2.9 12/01/2013 0858   LYMPHSABS 1.4 03/18/2013 0615   MONOABS 0.9 12/01/2013 0858   MONOABS 0.3 03/18/2013 0615   EOSABS 0.1 12/01/2013 0858   EOSABS 0.0 03/18/2013 0615   BASOSABS 0.0 12/01/2013 0858   BASOSABS 0.0 03/18/2013 0615        Chemistry      Component Value Date/Time   NA 138 12/01/2013 0858   NA 131* 06/14/2013 0800   K 5.3* 12/01/2013 0858   K 4.7 06/14/2013 0800   CL 101 06/15/2013 1034   CL 97 06/14/2013 0800   CO2 21* 12/01/2013 0858   CO2 24 06/14/2013 0800   BUN 43.2* 12/01/2013  0858   BUN 38* 06/14/2013 0800   CREATININE 1.1 12/01/2013 0858   CREATININE 0.97 06/14/2013 0800      Component Value Date/Time   CALCIUM 9.2 12/01/2013 0858   CALCIUM 9.1 06/14/2013 0800   ALKPHOS 168* 12/01/2013 0858   ALKPHOS 75 04/18/2013 1218   AST 35* 12/01/2013 0858   AST 24 04/18/2013 1218   ALT 57* 12/01/2013 0858   ALT 33 04/18/2013 1218   BILITOT 0.29 12/01/2013 0858   BILITOT 0.2* 04/18/2013 1218      Results for LOVELLE, LEMA (MRN [id number]) as of 11/17/2013 09:36  Ref. Range 10/07/2013 13:55 10/13/2013 08:43 10/20/2013 09:17 11/03/2013 13:48 11/16/2013 11:32  IgG (Immunoglobin G), Serum Latest Range: [phone] mg/dL [phone] 782 (L) 956 (L)    Results for BARBARA, KENG (MRN [id number]) as of 11/17/2013 09:36  Ref. Range 10/20/2013 09:17 10/27/2013 09:05 11/03/2013 13:48 11/10/2013 13:41 11/16/2013 11:32  Magnesium Latest Range: 1.5-2.5  mg/dl 1.7 1.6 1.8 1.8 1.9   Tacrolimus Lvl ( 5.0 - 20.0 ng/mL ):  7.3    9.6    10.0    8.5    12.8     STUDIES:  Dg Hip Complete Left  11/24/2013   CLINICAL DATA:  Left hip pain since physical therapy session 2 weeks ago. No known injury. History of leukemia and long-term steroid use.  EXAM: LEFT HIP - COMPLETE 2+ VIEW  COMPARISON:  None.  FINDINGS: Degenerative changes are seen in both hips in the lower lumbar spine. No evidence for acute fracture.  IMPRESSION: No evidence for acute  abnormality.   Electronically Signed   By: Rosalie Gums M.D.   On: 11/24/2013 16:46     ASSESSMENT: 67 y.o. [place] man with a history of well-differentiated lymphocytic lymphoma/ chronic lymphoid leukemia initially diagnosed in 2000, not requiring intervention until 2006; with multiple chromosomal abnormalities.  His treatment history is as follows:  (1) fludarabine/cyclophosphamide/rituximab x5 completed May 2007.   (2) rituximab for 8 doses October 2010, with partial response   (3) Leustatin and ofatumumab weekly x8 July to September  2011 followed by maintenance ofatumumab maintenance ofatumumab every 2 months, with initial response but rising counts September 2012   (4) status-post unrelated donor stem-cell transplant 02/24/2012 at [facility]  (a) conditioning regimen consisted of fludarabine + TBI at 200 cGy, followed by rituximab x27;  (b) CMV reactivation x3 (patient CMV positive, donor negative), s/p ganciclovir treatment; 3d reactivation August 2013, s/p gancyclovir, with negative PCR mid-September 2013; last gancyclovir dose 10/06/2012 (c) Chronic GVHD: involving gut and skin, treated with steroids, tacrolimus and MMF.  MMF was eventually d/c'd and tacrolimus currently at a dose of 1.5mg  BID (d) atrial fibrillation: resolved on brief amiodarone regimen (e) steroid-induced myopathy: improving  (f) hypomagnesemia: improved after d/c gancyclovir (g) hypogammaglobulinemia: s/p IVIG most recently 01/07/2013. (h) history of elevated triglycerides (606 on 07/14/2012)  (i) adrenal insufficiency: on prednisone and budesonide (j) pancytopenia,resolved  (5) restaging studies September 2013  including CT scans, flow cytometry, and bone marrow biopsy, showed no evidence of residual chronic lymphoid leukemia.  (6) recurrent GVHD (skin rash, mouth changes, severe diarrhea and gastric/duodenal/colonic biopsies 11/17/2012 c/w GVHD grade 2) : now at most grade 1 GI with no significant skin or mouth changes  (7)  malnutrition -- on VITAL supplement in addition to regular diet; on Marinol for anorexia  (8) testosterone deficiency--on patch   (9) deconditioning: ongoing REHAB   (10) mild dehydration: encouraged increased po fluids; receives IVF support w magnesium weekly  (11) steroid-induced osteoporosis with compression fractures: received pamidronate 12/18/2012. Status post kyphoplasty at L3-4 in June 2014. Waiting on completion of dental work to start denosumab  (12) chronic back pain controlled with OxyContin and  hydrocodone/APAP.  (12) nausea: improved on current meds  (13)  Positive c.diff, 03/08/2013, on Flagyl 500 mg TID x 20 days, then on oral vanco with Questran, showing improvement; still positive when last repeated April 2014  (14) persistently increased BUN  (15)  Hypertension, on labetalol, cardizem, lisinopril, and furosemide  (16) steroid induced hyperglycemia, on sterlix and 70/30 insulin, hemoglobin A1c 5.2 November of 2014  (17) hypertension: Lisinopril increased by Dr. Clelia Croft November 2014  (18) hypogammaglobulinemia-- requiring intermittent supplementation  (19) squamous cell CA in situ removed from left parietal scalp October 2014   PLAN: Tillman continues to do extremely well, and we are making no changes in his current regimen. He will receive magnesium with IV  fluids today and continue on a weekly basis. He'll also receive IVIG today, and we will continue to follow this level on a monthly basis.  Stepfon typically sees [name] for follow up visits every other week.  However, due to the upcoming holidays, Mikolaj will actually return to see Dr. Darnelle Catalan next week when he comes in for fluids on December 18. He will then have labs and fluids/magnesium alone on both December 26 and January 2. I will catch up with him again for followup and physical exam on January 8. All these appointments have been scheduled for him accordingly.  Aurther Loft and [place] both voice understanding and agreement with the above plan, and they know to call with any changes or problems prior to his next appointment.  He has a return appointment in [place] in March.    Adren Dollins, PA-C      12/01/2013

## 2013-12-02 LAB — IGG, IGA, IGM
IgA: <6 mg/dL — ABNORMAL LOW (ref 68–379)
IgG (Immunoglobin G), Serum: 310 mg/dL — ABNORMAL LOW (ref 650–1600)
IgM, Serum: 6 mg/dL — ABNORMAL LOW (ref 41–251)

## 2013-12-05 ENCOUNTER — Ambulatory Visit (INDEPENDENT_AMBULATORY_CARE_PROVIDER_SITE_OTHER): Payer: Medicare Other | Admitting: Endocrinology

## 2013-12-05 ENCOUNTER — Encounter: Payer: Self-pay | Admitting: Endocrinology

## 2013-12-05 VITALS — BP 132/84 | HR 61 | Temp 98.3°F | Ht 67.0 in | Wt 186.0 lb

## 2013-12-05 DIAGNOSIS — T380X5A Adverse effect of glucocorticoids and synthetic analogues, initial encounter: Secondary | ICD-10-CM

## 2013-12-05 DIAGNOSIS — E099 Drug or chemical induced diabetes mellitus without complications: Secondary | ICD-10-CM | POA: Insufficient documentation

## 2013-12-05 DIAGNOSIS — E139 Other specified diabetes mellitus without complications: Secondary | ICD-10-CM

## 2013-12-05 LAB — HEMOGLOBIN A1C: Hgb A1c MFr Bld: 6.1 % (ref 4.6–6.5)

## 2013-12-05 LAB — MICROALBUMIN / CREATININE URINE RATIO: Creatinine,U: 68.9 mg/dL

## 2013-12-05 NOTE — Progress Notes (Signed)
Subjective:    Patient ID: Timothy [name], male    DOB: 10-23-1946, 67 y.o.   MRN: [id number]  HPI Pt returns for f/u of insulin-requiring DM (dx'ed 2014, on a blood test while on steroids; he has mild if any neuropathy of the lower extremities; he has associated renal insufficiency; pt had bone-marrow transplant in 2013; he was started on insulin in early 2014); no cbg record, but states cbg's vary from 106-200's.  It is in general higher as the day goes on.  He has been able to taper prednisone down to 10 mg daily.   Past Medical History  Diagnosis Date  . 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 Diverticular disease   . Hyperlipidemia   . Obesity   . Hypertension   . Hiatal hernia   . CMV (cytomegalovirus) antibody positive     pre-transplant, with seroconversion x2 pst-transplant  . Right bundle branch block     pre-transplant  . CKD (chronic kidney disease) stage 2, GFR 60-89 ml/min   . Pancytopenia   . Steroid-induced diabetes   . Atrial fibrillation     post-transplant  . Myopathy   . 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 Leukemia, chronic lymphoid   . 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 Diarrhea in adult patient 12/05/2012    Due to active GVHD  . CLL (chronic lymphocytic leukemia) 12/05/2012    Dx 07/1999; started Rx 12/06  AlloBMT 3/13  . Rash of face 12/05/2012    Due to GVHD  . Hypomagnesemia 01/26/2013  . Left hip pain 12/01/2013    Past Surgical History  Procedure Laterality Date  . Tonsillectomy and adenoidectomy    . Bone marrow transplant    . Flexible sigmoidoscopy  11/17/2012    Procedure: FLEXIBLE SIGMOIDOSCOPY;  Surgeon: Petra Kuba, MD;  Location: [facility];  Service: Endoscopy;  Laterality: N/A;  Dr Ewing Schlein states will be admitted to [facility] 1339 11/16/12  . Esophagogastroduodenoscopy  11/17/2012    Procedure: ESOPHAGOGASTRODUODENOSCOPY  (EGD);  Surgeon: Petra Kuba, MD;  Location: [facility];  Service: Endoscopy;  Laterality: N/A;    History   Social History  . Marital Status: Married    Spouse Name: N/A    Number of Children: 1  . Years of Education: N/A   Occupational History  . PROFESSOR [employer]   Social History Main Topics  . Smoking status: Never Smoker   . Smokeless tobacco: Never Used  . Alcohol Use: No  . Drug Use: No  . Sexual Activity: Not on file   Other Topics Concern  . 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 Patient currently to his part-time at [employer]. The patient is married to [place], and has been married for 40 years. 1 daughter [name] who lives in [place].    Current Outpatient Prescriptions on File Prior to Visit  Medication Sig Dispense Refill  . acyclovir (ZOVIRAX) 400 MG tablet Take 2 tablets (800 mg total) by mouth 2 (two) times daily.  120 tablet  3  . B-D UF III MINI PEN NEEDLES 31G X 5 MM MISC       . budesonide (ENTOCORT EC) 3 MG 24 hr capsule Take 1 capsule (3 mg total) by mouth 3 (three) times daily.  90 capsule  5  . calcium citrate-vitamin D (CITRACAL+D) 315-200 MG-UNIT per tablet Take 1 tablet by mouth 2 (two) times daily.      . cholecalciferol (VITAMIN D) 1000 UNITS tablet Take 1,000 Units by mouth every evening.       . cholestyramine (QUESTRAN) 4 G packet Take 1 packet by mouth 3 (three) times daily with meals.      Marland Kitchen diltiazem (CARDIZEM CD) 240 MG 24 hr capsule TAKE 1 CAPSULE BY MOUTH DAILY  30 capsule  3  . ferrous sulfate 325 (65 FE) MG tablet Take 325 mg by mouth daily with breakfast.      . fluconazole (DIFLUCAN) 100 MG tablet Take 100 mg by mouth daily.      . furosemide (LASIX) 20 MG tablet TAKE 1 TABLET BY MOUTH EVERY MORNING  30 tablet  2  . Heparin Lock Flush (HEPARIN FLUSH, PORCINE,) 100 UNIT/ML injection 300 Units by Intracatheter route as needed (to flush Hickman port).        Marland Kitchen HYDROcodone-acetaminophen (NORCO) 10-325 MG per tablet Take 1-2 tablets by mouth every 6 (six) hours as needed.  120 tablet  0  . labetalol (NORMODYNE) 200 MG tablet Take 200 mg by mouth 2 (two) times daily.      . Lidocaine-Hydrocortisone Ace 3-0.5 % KIT Apply 1 application topically as needed (for pain).       . Multiple Vitamin (MULTIVITAMIN WITH MINERALS) TABS tablet Take 1 tablet by mouth daily.      Marland Kitchen omeprazole (PRILOSEC) 20 MG capsule Take 20 mg by mouth daily.      . ONE TOUCH ULTRA TEST test strip       . ONETOUCH DELICA LANCETS 33G MISC       . OxyCODONE (OXYCONTIN) 10 mg T12A 12 hr tablet Take 1 tablet (10 mg total) by mouth every 12 (twelve) hours.  60 tablet  0  . predniSONE (DELTASONE) 10 MG tablet Take 10 mg by mouth daily. Alternate 10 mg with 2.5 mg daily-per pt      . PREVIDENT 5000 PLUS 1.1 % CREA dental cream       . PROTEIN PO Take 6 g by mouth 3 (three) times daily.      . sertraline (ZOLOFT) 50 MG tablet ALTERNATE TAKING 1 TABLET DAILY AND 2 TABLETS DAILY  90 tablet  3  . Sodium Chloride Flush (NORMAL SALINE FLUSH) 0.9 % SOLN 10 mLs by Intracatheter route as needed (to flush Hickman).       Marland Kitchen sulfamethoxazole-trimethoprim (BACTRIM DS) 800-160 MG per tablet Take 1 tablet by mouth daily.      . tacrolimus (PROGRAF) 0.5 MG capsule TAKE 3 CAPSULES BY MOUTH TWO TIMES DAILY  180 capsule  2  . vancomycin (VANCOCIN) 50 mg/mL oral solution Take 2.5 mLs (125 mg total) by mouth every 6 (six) hours.  140 mL  0  . zinc sulfate 220 MG capsule Take 220 mg by mouth daily.      . [DISCONTINUED] insulin aspart (NOVOLOG FLEXPEN) 100 UNIT/ML SOPN FlexPen 18units sq qam, 9units sq qpm, or as directed  15 mL  1   Current Facility-Administered Medications on File Prior to Visit  Medication Dose Route Frequency Provider Last Rate Last Dose  . 0.9 %  sodium chloride infusion   Intravenous Continuous Amy G Berry, PA-C 500 mL/hr at 03/12/13 0900    . sodium chloride 0.9 % injection 10 mL  10  mL Intravenous PRN Lowella Dell, MD   10 mL  at 08/11/12 1606    Allergies  Allergen Reactions  . Benadryl [Diphenhydramine Hcl]     "Restless leg syndrome"    Family History  Problem Relation Age of Onset  . Cancer Father    BP 132/84  Pulse 61  Temp(Src) 98.3 F (36.8 C) (Oral)  Ht 5\' 7"  (1.702 m)  Wt 186 lb (84.369 kg)  BMI 29.12 kg/m2  SpO2 93%  Review of Systems denies hypoglycemia.  He has gained a few lbs.      Objective:   Physical Exam VITAL SIGNS:  See vs page GENERAL: no distress.  Lab Results  Component Value Date   HGBA1C 6.1 12/05/2013      Assessment & Plan:  DM: overcontrolled, for this regimen, which does match insulin to his changing needs throughout the day.  This insulin regimen was chosen from multiple options, for its simplicity.  The benefits of glycemic control must be weighed against the risks of hypoglycemia.   GVH, on prednisone taper.  The tapering will further decrease insulin requirement. Weight gain: this can complicate the rx of DM.

## 2013-12-05 NOTE — Patient Instructions (Signed)
blood tests are being requested for you today.  We'll contact you with results.  Please come back for a follow-up appointment in 3 months.   check your blood sugar twice a day.  vary the time of day when you check, between before the 3 meals, and at bedtime.  also check if you have symptoms of your blood sugar being too high or too low.  please keep a record of the readings and bring it to your next appointment here.  You can write it on any piece of paper.  please call us sooner if your blood sugar goes below 70, or if you have a lot of readings over 200.    

## 2013-12-08 ENCOUNTER — Other Ambulatory Visit: Payer: Self-pay | Admitting: Oncology

## 2013-12-08 ENCOUNTER — Ambulatory Visit (HOSPITAL_BASED_OUTPATIENT_CLINIC_OR_DEPARTMENT_OTHER): Payer: BC Managed Care – PPO | Admitting: Oncology

## 2013-12-08 ENCOUNTER — Ambulatory Visit: Payer: Medicare Other

## 2013-12-08 ENCOUNTER — Other Ambulatory Visit (HOSPITAL_BASED_OUTPATIENT_CLINIC_OR_DEPARTMENT_OTHER): Payer: BC Managed Care – PPO

## 2013-12-08 ENCOUNTER — Ambulatory Visit (HOSPITAL_BASED_OUTPATIENT_CLINIC_OR_DEPARTMENT_OTHER): Payer: BC Managed Care – PPO

## 2013-12-08 VITALS — BP 155/87 | HR 76 | Temp 98.1°F | Resp 18 | Ht 67.0 in | Wt 186.6 lb

## 2013-12-08 DIAGNOSIS — C9110 Chronic lymphocytic leukemia of B-cell type not having achieved remission: Secondary | ICD-10-CM

## 2013-12-08 DIAGNOSIS — T380X5A Adverse effect of glucocorticoids and synthetic analogues, initial encounter: Secondary | ICD-10-CM

## 2013-12-08 DIAGNOSIS — M818 Other osteoporosis without current pathological fracture: Secondary | ICD-10-CM

## 2013-12-08 DIAGNOSIS — Z9489 Other transplanted organ and tissue status: Secondary | ICD-10-CM

## 2013-12-08 DIAGNOSIS — A0471 Enterocolitis due to Clostridium difficile, recurrent: Secondary | ICD-10-CM

## 2013-12-08 DIAGNOSIS — E139 Other specified diabetes mellitus without complications: Secondary | ICD-10-CM

## 2013-12-08 DIAGNOSIS — D849 Immunodeficiency, unspecified: Secondary | ICD-10-CM

## 2013-12-08 DIAGNOSIS — D89811 Chronic graft-versus-host disease: Secondary | ICD-10-CM

## 2013-12-08 DIAGNOSIS — E099 Drug or chemical induced diabetes mellitus without complications: Secondary | ICD-10-CM

## 2013-12-08 DIAGNOSIS — T8600 Unspecified complication of bone marrow transplant: Secondary | ICD-10-CM

## 2013-12-08 DIAGNOSIS — M549 Dorsalgia, unspecified: Secondary | ICD-10-CM

## 2013-12-08 DIAGNOSIS — A0472 Enterocolitis due to Clostridium difficile, not specified as recurrent: Secondary | ICD-10-CM

## 2013-12-08 DIAGNOSIS — G8929 Other chronic pain: Secondary | ICD-10-CM

## 2013-12-08 LAB — COMPREHENSIVE METABOLIC PANEL (CC13)
AST: 35 U/L — ABNORMAL HIGH (ref 5–34)
Albumin: 3.5 g/dL (ref 3.5–5.0)
Alkaline Phosphatase: 171 U/L — ABNORMAL HIGH (ref 40–150)
Anion Gap: 9 mEq/L (ref 3–11)
BUN: 54.1 mg/dL — ABNORMAL HIGH (ref 7.0–26.0)
CO2: 20 mEq/L — ABNORMAL LOW (ref 22–29)
Calcium: 9.3 mg/dL (ref 8.4–10.4)
Chloride: 110 mEq/L — ABNORMAL HIGH (ref 98–109)
Creatinine: 1.2 mg/dL (ref 0.7–1.3)
Glucose: 174 mg/dl — ABNORMAL HIGH (ref 70–140)
Potassium: 5.4 mEq/L — ABNORMAL HIGH (ref 3.5–5.1)
Total Bilirubin: 0.27 mg/dL (ref 0.20–1.20)

## 2013-12-08 LAB — CBC WITH DIFFERENTIAL/PLATELET
Basophils Absolute: 0 10*3/uL (ref 0.0–0.1)
Eosinophils Absolute: 0 10*3/uL (ref 0.0–0.5)
HGB: 12.2 g/dL — ABNORMAL LOW (ref 13.0–17.1)
MCHC: 32.7 g/dL (ref 32.0–36.0)
MCV: 92.6 fL (ref 79.3–98.0)
MONO%: 6.9 % (ref 0.0–14.0)
NEUT#: 5.3 10*3/uL (ref 1.5–6.5)
NEUT%: 58.6 % (ref 39.0–75.0)
RDW: 14.8 % — ABNORMAL HIGH (ref 11.0–14.6)
WBC: 9.1 10*3/uL (ref 4.0–10.3)

## 2013-12-08 LAB — MAGNESIUM (CC13): Magnesium: 1.8 mg/dl (ref 1.5–2.5)

## 2013-12-08 LAB — CLOSTRIDIUM DIFFICILE BY PCR: Toxigenic C. Difficile by PCR: NEGATIVE

## 2013-12-08 MED ORDER — SODIUM CHLORIDE 0.9 % IV SOLN
Freq: Once | INTRAVENOUS | Status: AC
  Administered 2013-12-08: 11:00:00 via INTRAVENOUS
  Filled 2013-12-08: qty 500

## 2013-12-08 MED ORDER — HYDROCODONE-ACETAMINOPHEN 10-325 MG PO TABS: 1.0000 | ORAL_TABLET | Freq: Four times a day (QID) | ORAL | Status: DC | PRN

## 2013-12-08 MED ORDER — DENOSUMAB 120 MG/1.7ML ~~LOC~~ SOLN
120.0000 mg | Freq: Once | SUBCUTANEOUS | Status: AC
Start: 2013-12-08 — End: 2013-12-08
  Administered 2013-12-08: 120 mg via SUBCUTANEOUS
  Filled 2013-12-08: qty 1.7

## 2013-12-08 MED ORDER — VANCOMYCIN 50 MG/ML ORAL SOLUTION: 125.0000 mg | Freq: Four times a day (QID) | ORAL | Status: DC

## 2013-12-08 MED ORDER — OXYCODONE HCL ER 10 MG PO T12A: 10.0000 mg | EXTENDED_RELEASE_TABLET | Freq: Two times a day (BID) | ORAL | Status: DC

## 2013-12-08 NOTE — Telephone Encounter (Signed)
appts made and printed...td 

## 2013-12-08 NOTE — Patient Instructions (Addendum)
Hypomagnesemia Magnesium is a common ion (mineral) in the body which is needed for metabolism. It is about how the body handles food and other chemical reactions necessary for life. Only about 2% of the magnesium in our body is found in the blood. When this is low, it is called hypomagnesemia. The blood will measure only a tiny amount of the magnesium in our body. When it is low in our blood, it does not mean that the whole body supply is low. The normal serum concentration ranges from 1.8-2.5 mEq/L. When the level gets to be less than 1.0 mEq/L, a number of problems begin to happen.  CAUSES   Receiving intravenous fluids without magnesium replacement.  Loss of magnesium from the bowel by naso-gastric suction.  Loss of magnesium from nausea and vomiting or severe diarrhea. Any of the inflammatory bowel conditions can cause this.  Abuse of alcohol often leads to low serum magnesium.  An inherited form of magnesium loss happens when the kidneys lose magnesium. This is called familial or primary hypomagnesemia.  Some medications such as diuretics also cause the loss of magnesium. SYMPTOMS  These following problems are worse if the changes in magnesium levels come on suddenly.  Tremor.  Confusion.  Muscle weakness.  Over-sensitive to sights and sounds.  Sensitive reflexes.  Depression.  Muscular fibrillations.  Over-reactivity of the nerves.  Irritability.  Psychosis.  Spasms of the hand muscles.  Tetany (where the muscles go into uncontrollable spasms). DIAGNOSIS  This condition can be diagnosed by blood tests. TREATMENT   In emergency, magnesium can be given intravenously (by vein).  If the condition is less worrisome, it can be corrected by diet. High levels of magnesium are found in green leafy vegetables, peas, beans and nuts among other things. It can also be given through medications by mouth.  If it is being caused by medications, changes can be made.  If  alcohol is a problem, help is available if there are difficulties giving it up. Document Released: 09/03/2005 Document Revised: 03/01/2012 Document Reviewed: 07/28/2008 [facility] Patient Information 2014 [place], [place]. Denosumab injection What is this medicine? DENOSUMAB (den oh sue mab) slows bone breakdown. Prolia is used to treat osteoporosis in women after menopause and in men. Rivka Barbara is used to prevent bone fractures and other bone problems caused by cancer bone metastases. Rivka Barbara is also used to treat giant cell tumor of the bone. This medicine may be used for other purposes; ask your health care provider or pharmacist if you have questions. COMMON BRAND NAME(S): Prolia, XGEVA What should I tell my health care provider before I take this medicine? They need to know if you have any of these conditions: -dental disease -eczema -infection or history of infections -kidney disease or on dialysis -low blood calcium or vitamin D -malabsorption syndrome -scheduled to have surgery or tooth extraction -taking medicine that contains denosumab -thyroid or parathyroid disease -an unusual reaction to denosumab, other medicines, foods, dyes, or preservatives -pregnant or trying to get pregnant -breast-feeding How should I use this medicine? This medicine is for injection under the skin. It is given by a health care professional in a hospital or clinic setting. If you are getting Prolia, a special MedGuide will be given to you by the pharmacist with each prescription and refill. Be sure to read this information carefully each time. For Prolia, talk to your pediatrician regarding the use of this medicine in children. Special care may be needed. For Rivka Barbara, talk to your pediatrician regarding the  use of this medicine in children. While this drug may be prescribed for children as young as 13 years for selected conditions, precautions do apply. Overdosage: If you think you've taken too much of this  medicine contact a poison control center or emergency room at once. Overdosage: If you think you have taken too much of this medicine contact a poison control center or emergency room at once. NOTE: This medicine is only for you. Do not share this medicine with others. What if I miss a dose? It is important not to miss your dose. Call your doctor or health care professional if you are unable to keep an appointment. What may interact with this medicine? Do not take this medicine with any of the following medications: -other medicines containing denosumab This medicine may also interact with the following medications: -medicines that suppress the immune system -medicines that treat cancer -steroid medicines like prednisone or cortisone This list may not describe all possible interactions. Give your health care provider a list of all the medicines, herbs, non-prescription drugs, or dietary supplements you use. Also tell them if you smoke, drink alcohol, or use illegal drugs. Some items may interact with your medicine. What should I watch for while using this medicine? Visit your doctor or health care professional for regular checks on your progress. Your doctor or health care professional may order blood tests and other tests to see how you are doing. Call your doctor or health care professional if you get a cold or other infection while receiving this medicine. Do not treat yourself. This medicine may decrease your body's ability to fight infection. You should make sure you get enough calcium and vitamin D while you are taking this medicine, unless your doctor tells you not to. Discuss the foods you eat and the vitamins you take with your health care professional. See your dentist regularly. Brush and floss your teeth as directed. Before you have any dental work done, tell your dentist you are receiving this medicine. Do not become pregnant while taking this medicine or for 5 months after stopping  it. Women should inform their doctor if they wish to become pregnant or think they might be pregnant. There is a potential for serious side effects to an unborn child. Talk to your health care professional or pharmacist for more information. What side effects may I notice from receiving this medicine? Side effects that you should report to your doctor or health care professional as soon as possible: -allergic reactions like skin rash, itching or hives, swelling of the face, lips, or tongue -breathing problems -chest pain -fast, irregular heartbeat -feeling faint or lightheaded, falls -fever, chills, or any other sign of infection -muscle spasms, tightening, or twitches -numbness or tingling -skin blisters or bumps, or is dry, peels, or red -slow healing or unexplained pain in the mouth or jaw -unusual bleeding or bruising Side effects that usually do not require medical attention (Report these to your doctor or health care professional if they continue or are bothersome.): -muscle pain -stomach upset, gas This list may not describe all possible side effects. Call your doctor for medical advice about side effects. You may report side effects to FDA at 1-800-FDA-1088. Where should I keep my medicine? This medicine is only given in a clinic, doctor's office, or other health care setting and will not be stored at home. NOTE: This sheet is a summary. It may not cover all possible information. If you have questions about this medicine, talk to your  doctor, pharmacist, or health care provider.  2014, [employer]. (2012-06-07 12:37:47)

## 2013-12-08 NOTE — Progress Notes (Signed)
ID: Timothy [name]   DOB: 16-Apr-1946  MR#: [id number]  WUJ#:[id number]  NFA:OZHY,Q DOUGLAS, MD SU: OTHER MD: Karlyn Agee, Romero Belling, Lorina Rabon, Jefffrey Nira Conn  CHIEF COMPLAINT:  CLL, status post allogeneic stem cell transplant   HISTORY OF PRESENT ILLNESS: We have very complete records from Dr. Sydnee Levans in [place], and in summary:  The patient was initially diagnosed in August 2000, with a white cell count of 23,600, but normal hemoglobin and platelets, and no significant symptomatology. Over the next several years his white cell count drifted up, and he eventually developed some symptoms of night sweats in particular, leading to treatment with fludarabine, Cytoxan and rituxan for five cycles given between December 2006 and May 2007.  We have CT scans from June 2006, November 2006 and April 2007, and comparing the November 2006 and April 2007 scans, there was near complete response. He had subsequent therapy in [place] as detailed below, but with decreased response, leading to allogeneic stem-cell transplant at the [facility] 02/24/2012.  INTERVAL HISTORY: Timothy [name] returns with his wife Timothy [name]  for followup of his CLL.  He continues to receive IV fluids with IV magnesium on a weekly basis and is due for his next infusion today. He received IVIG last week, for a baseline IgG level of 311.   REVIEW OF SYSTEMS: Dozier had diarrhea for one 24-hour episode. This was the same day that we went down on prednisone to 10 mg daily. However the diarrhea has not recurred. He received his IVIG last week without event. He developed left hip pain, saw Dr. Delynn Flavin, had a cortisone shot which helped, and has been told to take some Aleve for that. He tells me his teeth are now "fine, so we can start denosumab today. Because of the hip problem he stopped going to physical therapy, but is now ready to resume. He bruises easily. He has not had any overt  bleeding. A detailed review of systems today was otherwise stable   PAST MEDICAL HISTORY: Past Medical History  Diagnosis Date  . 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 Diverticular disease   . Hyperlipidemia   . Obesity   . Hypertension   . Hiatal hernia   . CMV (cytomegalovirus) antibody positive     pre-transplant, with seroconversion x2 pst-transplant  . Right bundle branch block     pre-transplant  . CKD (chronic kidney disease) stage 2, GFR 60-89 ml/min   . Pancytopenia   . Steroid-induced diabetes   . Atrial fibrillation     post-transplant  . Myopathy   . 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 Leukemia, chronic lymphoid   . 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 Diarrhea in adult patient 12/05/2012    Due to active GVHD  . CLL (chronic lymphocytic leukemia) 12/05/2012    Dx 07/1999; started Rx 12/06  AlloBMT 3/13  . Rash of face 12/05/2012    Due to GVHD  . Hypomagnesemia 01/26/2013  . Left hip pain 12/01/2013    PAST SURGICAL HISTORY: Past Surgical History  Procedure Laterality Date  . Tonsillectomy and adenoidectomy    . Bone marrow transplant    . Flexible sigmoidoscopy  11/17/2012    Procedure: FLEXIBLE SIGMOIDOSCOPY;  Surgeon: Petra Kuba, MD;  Location: [facility];  Service: Endoscopy;  Laterality: N/A;  Dr Ewing Schlein states will be admitted to rooom 1339 11/16/12  .  Esophagogastroduodenoscopy  11/17/2012    Procedure: ESOPHAGOGASTRODUODENOSCOPY (EGD);  Surgeon: Petra Kuba, MD;  Location: [facility];  Service: Endoscopy;  Laterality: N/A;    FAMILY HISTORY Family History  Problem Relation Age of Onset  . Cancer Father    The patient's father died from complications of chronic lymphocytic leukemia at the age of 34.  It had been diagnosed seven years before when he was 6.  The patient is enrolled in a familial chronic lymphocytic leukemia  study out of the [employer].  The patient's mother is 53, alive, unfortunately suffering with dementia, and he has a brother, 86, who is otherwise in fair health.   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  His wife of >40 years, Timothy [name], is a homemaker.  Their daughter, Timothy [name], lives in [place].  She also is a Futures trader.  The patient has an 88 year old grandson and an 29-year-old granddaughter, and that is really the main reason he moved to this area.  He is a International aid/development worker.     ADVANCED DIRECTIVES: In place  HEALTH MAINTENANCE: (Updated 11/03/2013) History  Substance Use Topics  . Smoking status: Never Smoker   . Smokeless tobacco: Never Used  . Alcohol Use: No     Colonoscopy: Nov 2013, Dr. Ewing Schlein  PSA:  Bone density:    Lipid panel: May 2014, elevated    Allergies  Allergen Reactions  . Benadryl [Diphenhydramine Hcl]     "Restless leg syndrome"    Current Outpatient Prescriptions  Medication Sig Dispense Refill  . acyclovir (ZOVIRAX) 400 MG tablet Take 2 tablets (800 mg total) by mouth 2 (two) times daily.  120 tablet  3  . B-D UF III MINI PEN NEEDLES 31G X 5 MM MISC       . budesonide (ENTOCORT EC) 3 MG 24 hr capsule Take 1 capsule (3 mg total) by mouth 3 (three) times daily.  90 capsule  5  . calcium citrate-vitamin D (CITRACAL+D) 315-200 MG-UNIT per tablet Take 1 tablet by mouth 2 (two) times daily.      . cholecalciferol (VITAMIN D) 1000 UNITS tablet Take 1,000 Units by mouth every evening.       . cholestyramine (QUESTRAN) 4 G packet Take 1 packet by mouth 3 (three) times daily with meals.      Marland Kitchen diltiazem (CARDIZEM CD) 240 MG 24 hr capsule TAKE 1 CAPSULE BY MOUTH DAILY  30 capsule  3  . ferrous sulfate 325 (65 FE) MG tablet Take 325 mg by mouth daily with breakfast.      . fluconazole (DIFLUCAN) 100 MG tablet Take 100 mg by mouth daily.       . furosemide (LASIX) 20 MG tablet TAKE 1 TABLET BY MOUTH EVERY MORNING  30 tablet  2  . Heparin Lock Flush (HEPARIN FLUSH, PORCINE,) 100 UNIT/ML injection 300 Units by Intracatheter route as needed (to flush Hickman port).       Marland Kitchen HYDROcodone-acetaminophen (NORCO) 10-325 MG per tablet Take 1-2 tablets by mouth every 6 (six) hours as needed.  120 tablet  0  . Insulin Aspart Prot & Aspart (70-30) 100 UNIT/ML SUPN Inject 10 Units into the skin daily with breakfast.       . labetalol (NORMODYNE) 200 MG tablet Take 200 mg by mouth 2 (two) times daily.      . Lidocaine-Hydrocortisone Ace 3-0.5 % KIT Apply 1  application topically as needed (for pain).       Marland Kitchen lisinopril (PRINIVIL,ZESTRIL) 10 MG tablet Take 40 mg by mouth every evening.      . Multiple Vitamin (MULTIVITAMIN WITH MINERALS) TABS tablet Take 1 tablet by mouth daily.      Marland Kitchen omeprazole (PRILOSEC) 20 MG capsule Take 20 mg by mouth daily.      . ONE TOUCH ULTRA TEST test strip       . ONETOUCH DELICA LANCETS 33G MISC       . OxyCODONE (OXYCONTIN) 10 mg T12A 12 hr tablet Take 1 tablet (10 mg total) by mouth every 12 (twelve) hours.  60 tablet  0  . predniSONE (DELTASONE) 10 MG tablet Take 10 mg by mouth daily. Alternate 10 mg with 2.5 mg daily-per pt      . PREVIDENT 5000 PLUS 1.1 % CREA dental cream       . PROTEIN PO Take 6 g by mouth 3 (three) times daily.      . sertraline (ZOLOFT) 50 MG tablet ALTERNATE TAKING 1 TABLET DAILY AND 2 TABLETS DAILY  90 tablet  3  . Sodium Chloride Flush (NORMAL SALINE FLUSH) 0.9 % SOLN 10 mLs by Intracatheter route as needed (to flush Hickman).       Marland Kitchen sulfamethoxazole-trimethoprim (BACTRIM DS) 800-160 MG per tablet Take 1 tablet by mouth daily.      . tacrolimus (PROGRAF) 0.5 MG capsule TAKE 3 CAPSULES BY MOUTH TWO TIMES DAILY  180 capsule  2  . vancomycin (VANCOCIN) 50 mg/mL oral solution Take 2.5 mLs (125 mg total) by mouth every 6 (six) hours.  140 mL  0  . zinc sulfate 220 MG capsule Take 220 mg by  mouth daily.      . [DISCONTINUED] insulin aspart (NOVOLOG FLEXPEN) 100 UNIT/ML SOPN FlexPen 18units sq qam, 9units sq qpm, or as directed  15 mL  1   No current facility-administered medications for this visit.   Facility-Administered Medications Ordered in Other Visits  Medication Dose Route Frequency Provider Last Rate Last Dose  . 0.9 %  sodium chloride infusion   Intravenous Continuous Amy G Berry, PA-C 500 mL/hr at 03/12/13 0900    . sodium chloride 0.9 % injection 10 mL  10 mL Intravenous PRN Lowella Dell, MD   10 mL at 08/11/12 1606    OBJECTIVE: Middle-aged white male in no acute distress Filed Vitals:   12/08/13 0859  BP: 155/87  Pulse: 76  Temp: 98.1 F (36.7 C)  Resp: 18  Body mass index is 29.22 kg/(m^2).  ECOG:  1 Filed Weights   12/08/13 0859  Weight: 186 lb 9.6 oz (84.641 kg)   Physical Exam: HEENT:  Sclerae anicteric.  Pupils equal round and reactive to light. Dentition is fair LUNGS:  Clear to auscultation bilaterally with good excursion.  No wheezes or rhonchi HEART:  Regular rate and rhythm. No murmur  ABDOMEN:  Soft, nontender.  Positive bowel sounds.  MSK:  No joint swelling. Good range of motion in both the upper and lower extremities. Cannot stand from a low sitting position without pushing off chair EXTREMITIES:  Nonpitting pedal edema, equal bilaterally. No upper extremity edema is noted.   SKIN:  Scattered ecchymoses noted on the upper extremities, stable;  NEURO:  Nonfocal. Well oriented.  Appropriate affect.   LAB RESULTS: (latest)  IgG  311  Mb++  1.7  Tacrolimus 6.1  CBC    Component Value Date/Time   WBC 9.1  12/08/2013 0848   WBC 7.3 08/18/2013 0725   RBC 4.03* 12/08/2013 0848   RBC 3.60* 08/18/2013 0725   RBC 3.82* 03/16/2013 1400   HGB 12.2* 12/08/2013 0848   HGB 11.2* 08/18/2013 0725   HCT 37.3* 12/08/2013 0848   HCT 33.8* 08/18/2013 0725   PLT 162 12/08/2013 0848   PLT 112* 08/18/2013 0725   MCV 92.6 12/08/2013 0848   MCV  93.9 08/18/2013 0725   MCH 30.3 12/08/2013 0848   MCH 31.1 08/18/2013 0725   MCHC 32.7 12/08/2013 0848   MCHC 33.1 08/18/2013 0725   RDW 14.8* 12/08/2013 0848   RDW 14.6 08/18/2013 0725   LYMPHSABS 3.1 12/08/2013 0848   LYMPHSABS 1.4 03/18/2013 0615   MONOABS 0.6 12/08/2013 0848   MONOABS 0.3 03/18/2013 0615   EOSABS 0.0 12/08/2013 0848   EOSABS 0.0 03/18/2013 0615   BASOSABS 0.0 12/08/2013 0848   BASOSABS 0.0 03/18/2013 0615        Chemistry      Component Value Date/Time   NA 138 12/01/2013 0858   NA 131* 06/14/2013 0800   K 5.3* 12/01/2013 0858   K 4.7 06/14/2013 0800   CL 101 06/15/2013 1034   CL 97 06/14/2013 0800   CO2 21* 12/01/2013 0858   CO2 24 06/14/2013 0800   BUN 43.2* 12/01/2013 0858   BUN 38* 06/14/2013 0800   CREATININE 1.1 12/01/2013 0858   CREATININE 0.97 06/14/2013 0800      Component Value Date/Time   CALCIUM 9.2 12/01/2013 0858   CALCIUM 9.1 06/14/2013 0800   ALKPHOS 168* 12/01/2013 0858   ALKPHOS 75 04/18/2013 1218   AST 35* 12/01/2013 0858   AST 24 04/18/2013 1218   ALT 57* 12/01/2013 0858   ALT 33 04/18/2013 1218   BILITOT 0.29 12/01/2013 0858   BILITOT 0.2* 04/18/2013 1218      STUDIES:  Dg Hip Complete Left  11/24/2013   CLINICAL DATA:  Left hip pain since physical therapy session 2 weeks ago. No known injury. History of leukemia and long-term steroid use.  EXAM: LEFT HIP - COMPLETE 2+ VIEW  COMPARISON:  None.  FINDINGS: Degenerative changes are seen in both hips in the lower lumbar spine. No evidence for acute fracture.  IMPRESSION: No evidence for acute  abnormality.   Electronically Signed   By: Rosalie Gums M.D.   On: 11/24/2013 16:46     ASSESSMENT: 67 y.o. Gross man with a history of well-differentiated lymphocytic lymphoma/ chronic lymphoid leukemia initially diagnosed in 2000, not requiring intervention until 2006; with multiple chromosomal abnormalities.  His treatment history is as follows:  (1) fludarabine/cyclophosphamide/rituximab x5  completed May 2007.   (2) rituximab for 8 doses October 2010, with partial response   (3) Leustatin and ofatumumab weekly x8 July to September 2011 followed by maintenance ofatumumab maintenance ofatumumab every 2 months, with initial response but rising counts September 2012   (4) status-post unrelated donor stem-cell transplant [facility]/24/2012 [facility]  (a) conditioning regimen consisted of fludarabine + TBI at 200 cGy, followed by rituximab x27;  (b) CMV reactivation x3 (patient CMV positive, donor negative), s/p ganciclovir treatment; 3d reactivation August 2013, s/p gancyclovir, with negative PCR mid-September 2013; last gancyclovir dose 10/06/2012 (c) Chronic GVHD: involving gut and skin, treated with steroids, tacrolimus and MMF.  MMF was eventually d/c'd and tacrolimus currently at a dose of 1.5mg  BID (d) atrial fibrillation: resolved on brief amiodarone regimen (e) steroid-induced myopathy: improving  (f) hypomagnesemia: improved after d/c gancyclovir (g) hypogammaglobulinemia:  s/p IVIG most recently 01/07/2013. (h) history of elevated triglycerides (606 on 07/14/2012)  (i) adrenal insufficiency: on prednisone and budesonide (j) pancytopenia,resolved  (5) restaging studies September 2013  including CT scans, flow cytometry, and bone marrow biopsy, showed no evidence of residual chronic lymphoid leukemia.  (6) recurrent GVHD (skin rash, mouth changes, severe diarrhea and gastric/duodenal/colonic biopsies 11/17/2012 c/w GVHD grade 2) : now at most grade 1 GI with no significant skin or mouth changes  (7)  malnutrition -- on VITAL supplement in addition to regular diet; on Marinol for anorexia  (8) testosterone deficiency--on patch   (9) deconditioning: ongoing REHAB   (10) mild dehydration: encouraged increased po fluids; receives IVF support w magnesium weekly  (11) steroid-induced osteoporosis with compression fractures: received pamidronate 12/18/2012. Status post  kyphoplasty at L3-4 in June 2014. Denosumab started 12/08/2013, to be repeated every 3 months.  (12) chronic back pain controlled with OxyContin and hydrocodone/APAP.  (12) nausea: improved on current meds  (13)  Positive c.diff, 03/08/2013, on Flagyl 500 mg TID x 20 days, then on oral vanco with Questran, showing improvement; still positive when last repeated April 2014  (14) persistently increased BUN  (15)  Hypertension, on labetalol, cardizem, lisinopril, and furosemide;Lisinopril increased by Dr. Clelia Croft November 2014   (16) steroid induced hyperglycemia, on sterlix and 70/30 insulin, hemoglobin A1c 5.2 November of 2014  (17) hypogammaglobulinemia-- requiring intermittent supplementation  (18) squamous cell CA in situ removed from left parietal scalp October 2014   PLAN: Ubaldo is doing remarkably well overall, and we are continuing the very slow prednisone taper. He is currently on 10 mg every morning and will remain on that at least to mid January. Then we will go to 5 mg in the morning and 2 in half in the evening. Of course he continues on Entocort.  We have not had a C. difficile since March. We're going to try to get a sample today. He continues on oral vancomycin.  I showed him to try to get up from a sitting position without pushing off and he is going to work on that over the next couple of months until he is able to do that more easily. He also understands the possible toxicities, side effects and complications of denosumab. He will receive a dose today and will consider getting another 13 months from now.  He has a return appointment in [place] in March. He knows to call for any problems that may develop before the next visit here.    Lowella Dell, MD      12/08/2013

## 2013-12-16 ENCOUNTER — Ambulatory Visit (HOSPITAL_BASED_OUTPATIENT_CLINIC_OR_DEPARTMENT_OTHER): Payer: BC Managed Care – PPO

## 2013-12-16 ENCOUNTER — Other Ambulatory Visit (HOSPITAL_BASED_OUTPATIENT_CLINIC_OR_DEPARTMENT_OTHER): Payer: BC Managed Care – PPO

## 2013-12-16 ENCOUNTER — Other Ambulatory Visit: Payer: Self-pay | Admitting: *Deleted

## 2013-12-16 DIAGNOSIS — D849 Immunodeficiency, unspecified: Secondary | ICD-10-CM

## 2013-12-16 DIAGNOSIS — D89811 Chronic graft-versus-host disease: Secondary | ICD-10-CM

## 2013-12-16 DIAGNOSIS — Z9489 Other transplanted organ and tissue status: Secondary | ICD-10-CM

## 2013-12-16 DIAGNOSIS — T8609 Other complications of bone marrow transplant: Secondary | ICD-10-CM

## 2013-12-16 DIAGNOSIS — C9110 Chronic lymphocytic leukemia of B-cell type not having achieved remission: Secondary | ICD-10-CM

## 2013-12-16 LAB — COMPREHENSIVE METABOLIC PANEL (CC13)
ALT: 63 U/L — ABNORMAL HIGH (ref 0–55)
AST: 43 U/L — ABNORMAL HIGH (ref 5–34)
Alkaline Phosphatase: 131 U/L (ref 40–150)
Anion Gap: 10 mEq/L (ref 3–11)
BUN: 47 mg/dL — ABNORMAL HIGH (ref 7.0–26.0)
Creatinine: 1 mg/dL (ref 0.7–1.3)
Total Bilirubin: 0.27 mg/dL (ref 0.20–1.20)

## 2013-12-16 LAB — CBC WITH DIFFERENTIAL/PLATELET
BASO%: 0.1 % (ref 0.0–2.0)
EOS%: 0.2 % (ref 0.0–7.0)
MCHC: 32.9 g/dL (ref 32.0–36.0)
MONO#: 0.7 10*3/uL (ref 0.1–0.9)
RBC: 4.09 10*6/uL — ABNORMAL LOW (ref 4.20–5.82)
RDW: 15.6 % — ABNORMAL HIGH (ref 11.0–14.6)
WBC: 9.5 10*3/uL (ref 4.0–10.3)
lymph#: 3.2 10*3/uL (ref 0.9–3.3)
nRBC: 0 % (ref 0–0)

## 2013-12-16 MED ORDER — HEPARIN SOD (PORK) LOCK FLUSH 100 UNIT/ML IV SOLN
500.0000 [IU] | Freq: Once | INTRAVENOUS | Status: AC
  Administered 2013-12-16: 500 [IU] via INTRAVENOUS
  Filled 2013-12-16: qty 5

## 2013-12-16 MED ORDER — SODIUM CHLORIDE 0.9 % IV SOLN
Freq: Once | INTRAVENOUS | Status: AC
  Administered 2013-12-16: 12:00:00 via INTRAVENOUS
  Filled 2013-12-16: qty 500

## 2013-12-16 MED ORDER — SODIUM CHLORIDE 0.9 % IJ SOLN
10.0000 mL | INTRAMUSCULAR | Status: DC | PRN
  Administered 2013-12-16: 10 mL via INTRAVENOUS
  Filled 2013-12-16: qty 10

## 2013-12-16 NOTE — Patient Instructions (Signed)
Hypomagnesemia Magnesium is a common ion (mineral) in the body which is needed for metabolism. It is about how the body handles food and other chemical reactions necessary for life. Only about 2% of the magnesium in our body is found in the blood. When this is low, it is called hypomagnesemia. The blood will measure only a tiny amount of the magnesium in our body. When it is low in our blood, it does not mean that the whole body supply is low. The normal serum concentration ranges from 1.8-2.5 mEq/L. When the level gets to be less than 1.0 mEq/L, a number of problems begin to happen.  CAUSES   Receiving intravenous fluids without magnesium replacement.  Loss of magnesium from the bowel by naso-gastric suction.  Loss of magnesium from nausea and vomiting or severe diarrhea. Any of the inflammatory bowel conditions can cause this.  Abuse of alcohol often leads to low serum magnesium.  An inherited form of magnesium loss happens when the kidneys lose magnesium. This is called familial or primary hypomagnesemia.  Some medications such as diuretics also cause the loss of magnesium. SYMPTOMS  These following problems are worse if the changes in magnesium levels come on suddenly.  Tremor.  Confusion.  Muscle weakness.  Over-sensitive to sights and sounds.  Sensitive reflexes.  Depression.  Muscular fibrillations.  Over-reactivity of the nerves.  Irritability.  Psychosis.  Spasms of the hand muscles.  Tetany (where the muscles go into uncontrollable spasms). DIAGNOSIS  This condition can be diagnosed by blood tests. TREATMENT   In emergency, magnesium can be given intravenously (by vein).  If the condition is less worrisome, it can be corrected by diet. High levels of magnesium are found in green leafy vegetables, peas, beans and nuts among other things. It can also be given through medications by mouth.  If it is being caused by medications, changes can be made.  If  alcohol is a problem, help is available if there are difficulties giving it up. Document Released: 09/03/2005 Document Revised: 03/01/2012 Document Reviewed: 07/28/2008 [employer] Patient Information 2014 [employer].        

## 2013-12-17 LAB — IGG, IGA, IGM
IgA: <6 mg/dL — ABNORMAL LOW (ref 68–379)
IgG (Immunoglobin G), Serum: 789 mg/dL (ref 650–1600)
IgM, Serum: 6 mg/dL — ABNORMAL LOW (ref 41–251)

## 2013-12-23 ENCOUNTER — Ambulatory Visit (HOSPITAL_BASED_OUTPATIENT_CLINIC_OR_DEPARTMENT_OTHER): Payer: BC Managed Care – PPO

## 2013-12-23 ENCOUNTER — Other Ambulatory Visit (HOSPITAL_BASED_OUTPATIENT_CLINIC_OR_DEPARTMENT_OTHER): Payer: BC Managed Care – PPO

## 2013-12-23 DIAGNOSIS — T8609 Other complications of bone marrow transplant: Secondary | ICD-10-CM

## 2013-12-23 DIAGNOSIS — C9110 Chronic lymphocytic leukemia of B-cell type not having achieved remission: Secondary | ICD-10-CM

## 2013-12-23 DIAGNOSIS — Z9489 Other transplanted organ and tissue status: Secondary | ICD-10-CM

## 2013-12-23 DIAGNOSIS — D89811 Chronic graft-versus-host disease: Secondary | ICD-10-CM

## 2013-12-23 LAB — CBC WITH DIFFERENTIAL/PLATELET
BASO%: 0.4 % (ref 0.0–2.0)
Basophils Absolute: 0 10*3/uL (ref 0.0–0.1)
EOS%: 0.2 % (ref 0.0–7.0)
Eosinophils Absolute: 0 10*3/uL (ref 0.0–0.5)
HEMATOCRIT: 39.1 % (ref 38.4–49.9)
HGB: 12.8 g/dL — ABNORMAL LOW (ref 13.0–17.1)
LYMPH%: 34.6 % (ref 14.0–49.0)
MCH: 30.9 pg (ref 27.2–33.4)
MCHC: 32.7 g/dL (ref 32.0–36.0)
MCV: 94.4 fL (ref 79.3–98.0)
MONO#: 0.7 10*3/uL (ref 0.1–0.9)
MONO%: 8.3 % (ref 0.0–14.0)
NEUT#: 4.9 10*3/uL (ref 1.5–6.5)
NEUT%: 56.5 % (ref 39.0–75.0)
Platelets: 138 10*3/uL — ABNORMAL LOW (ref 140–400)
RBC: 4.15 10*6/uL — ABNORMAL LOW (ref 4.20–5.82)
RDW: 15.4 % — ABNORMAL HIGH (ref 11.0–14.6)
WBC: 8.7 10*3/uL (ref 4.0–10.3)
lymph#: 3 10*3/uL (ref 0.9–3.3)

## 2013-12-23 LAB — COMPREHENSIVE METABOLIC PANEL (CC13)
ALT: 68 U/L — ABNORMAL HIGH (ref 0–55)
AST: 42 U/L — ABNORMAL HIGH (ref 5–34)
Albumin: 3.5 g/dL (ref 3.5–5.0)
Alkaline Phosphatase: 124 U/L (ref 40–150)
Anion Gap: 11 mEq/L (ref 3–11)
BUN: 43.5 mg/dL — AB (ref 7.0–26.0)
CALCIUM: 8.9 mg/dL (ref 8.4–10.4)
CHLORIDE: 107 meq/L (ref 98–109)
CO2: 18 meq/L — AB (ref 22–29)
Creatinine: 1.1 mg/dL (ref 0.7–1.3)
GLUCOSE: 153 mg/dL — AB (ref 70–140)
POTASSIUM: 5.6 meq/L — AB (ref 3.5–5.1)
SODIUM: 136 meq/L (ref 136–145)
TOTAL PROTEIN: 6.3 g/dL — AB (ref 6.4–8.3)
Total Bilirubin: 0.26 mg/dL (ref 0.20–1.20)

## 2013-12-23 LAB — MAGNESIUM (CC13): MAGNESIUM: 2 mg/dL (ref 1.5–2.5)

## 2013-12-23 MED ORDER — SODIUM CHLORIDE 0.9 % IV SOLN
Freq: Once | INTRAVENOUS | Status: AC
  Administered 2013-12-23: 11:00:00 via INTRAVENOUS
  Filled 2013-12-23: qty 500

## 2013-12-23 NOTE — Patient Instructions (Signed)
Dehydration, Adult Dehydration is when you lose more fluids from the body than you take in. Vital organs like the kidneys, brain, and heart cannot function without a proper amount of fluids and salt. Any loss of fluids from the body can cause dehydration.  CAUSES   Vomiting.  Diarrhea.  Excessive sweating.  Excessive urine output.  Fever. SYMPTOMS  Mild dehydration  Thirst.  Dry lips.  Slightly dry mouth. Moderate dehydration  Very dry mouth.  Sunken eyes.  Skin does not bounce back quickly when lightly pinched and released.  Dark urine and decreased urine production.  Decreased tear production.  Headache. Severe dehydration  Very dry mouth.  Extreme thirst.  Rapid, weak pulse (more than 100 beats per minute at rest).  Cold hands and feet.  Not able to sweat in spite of heat and temperature.  Rapid breathing.  Blue lips.  Confusion and lethargy.  Difficulty being awakened.  Minimal urine production.  No tears. DIAGNOSIS  Your caregiver will diagnose dehydration based on your symptoms and your exam. Blood and urine tests will help confirm the diagnosis. The diagnostic evaluation should also identify the cause of dehydration. TREATMENT  Treatment of mild or moderate dehydration can often be done at home by increasing the amount of fluids that you drink. It is best to drink small amounts of fluid more often. Drinking too much at one time can make vomiting worse. Refer to the home care instructions below. Severe dehydration needs to be treated at the hospital where you will probably be given intravenous (IV) fluids that contain water and electrolytes. HOME CARE INSTRUCTIONS   Ask your caregiver about specific rehydration instructions.  Drink enough fluids to keep your urine clear or pale yellow.  Drink small amounts frequently if you have nausea and vomiting.  Eat as you normally do.  Avoid:  Foods or drinks high in sugar.  Carbonated  drinks.  Juice.  Extremely hot or cold fluids.  Drinks with caffeine.  Fatty, greasy foods.  Alcohol.  Tobacco.  Overeating.  Gelatin desserts.  Wash your hands well to avoid spreading bacteria and viruses.  Only take over-the-counter or prescription medicines for pain, discomfort, or fever as directed by your caregiver.  Ask your caregiver if you should continue all prescribed and over-the-counter medicines.  Keep all follow-up appointments with your caregiver. SEEK MEDICAL CARE IF:  You have abdominal pain and it increases or stays in one area (localizes).  You have a rash, stiff neck, or severe headache.  You are irritable, sleepy, or difficult to awaken.  You are weak, dizzy, or extremely thirsty. SEEK IMMEDIATE MEDICAL CARE IF:   You are unable to keep fluids down or you get worse despite treatment.  You have frequent episodes of vomiting or diarrhea.  You have blood or green matter (bile) in your vomit.  You have blood in your stool or your stool looks black and tarry.  You have not urinated in 6 to 8 hours, or you have only urinated a small amount of very dark urine.  You have a fever.  You faint. MAKE SURE YOU:   Understand these instructions.  Will watch your condition.  Will get help right away if you are not doing well or get worse. Document Released: 12/08/2005 Document Revised: 03/01/2012 Document Reviewed: 07/28/2011 Exit[employer] Patient Information 2014 [employer].  

## 2013-12-29 ENCOUNTER — Encounter: Payer: Self-pay | Admitting: Physician Assistant

## 2013-12-29 ENCOUNTER — Telehealth: Payer: Self-pay | Admitting: *Deleted

## 2013-12-29 ENCOUNTER — Other Ambulatory Visit (HOSPITAL_BASED_OUTPATIENT_CLINIC_OR_DEPARTMENT_OTHER): Payer: BC Managed Care – PPO

## 2013-12-29 ENCOUNTER — Ambulatory Visit (HOSPITAL_BASED_OUTPATIENT_CLINIC_OR_DEPARTMENT_OTHER): Payer: BC Managed Care – PPO | Admitting: Physician Assistant

## 2013-12-29 ENCOUNTER — Ambulatory Visit (HOSPITAL_BASED_OUTPATIENT_CLINIC_OR_DEPARTMENT_OTHER): Payer: BC Managed Care – PPO

## 2013-12-29 ENCOUNTER — Other Ambulatory Visit (HOSPITAL_COMMUNITY): Payer: Self-pay | Admitting: Oncology

## 2013-12-29 VITALS — BP 148/73 | HR 77 | Temp 97.9°F | Resp 18 | Ht 67.0 in | Wt 188.7 lb

## 2013-12-29 DIAGNOSIS — D801 Nonfamilial hypogammaglobulinemia: Secondary | ICD-10-CM

## 2013-12-29 DIAGNOSIS — E2749 Other adrenocortical insufficiency: Secondary | ICD-10-CM

## 2013-12-29 DIAGNOSIS — C9110 Chronic lymphocytic leukemia of B-cell type not having achieved remission: Secondary | ICD-10-CM

## 2013-12-29 DIAGNOSIS — D899 Disorder involving the immune mechanism, unspecified: Secondary | ICD-10-CM

## 2013-12-29 DIAGNOSIS — D89811 Chronic graft-versus-host disease: Secondary | ICD-10-CM

## 2013-12-29 DIAGNOSIS — D849 Immunodeficiency, unspecified: Secondary | ICD-10-CM

## 2013-12-29 DIAGNOSIS — C8599 Non-Hodgkin lymphoma, unspecified, extranodal and solid organ sites: Secondary | ICD-10-CM

## 2013-12-29 DIAGNOSIS — R5381 Other malaise: Secondary | ICD-10-CM

## 2013-12-29 DIAGNOSIS — E099 Drug or chemical induced diabetes mellitus without complications: Secondary | ICD-10-CM

## 2013-12-29 DIAGNOSIS — E860 Dehydration: Secondary | ICD-10-CM

## 2013-12-29 DIAGNOSIS — T8609 Other complications of bone marrow transplant: Secondary | ICD-10-CM

## 2013-12-29 DIAGNOSIS — I10 Essential (primary) hypertension: Secondary | ICD-10-CM

## 2013-12-29 DIAGNOSIS — Z9489 Other transplanted organ and tissue status: Secondary | ICD-10-CM

## 2013-12-29 DIAGNOSIS — T380X5A Adverse effect of glucocorticoids and synthetic analogues, initial encounter: Secondary | ICD-10-CM

## 2013-12-29 DIAGNOSIS — M810 Age-related osteoporosis without current pathological fracture: Secondary | ICD-10-CM

## 2013-12-29 DIAGNOSIS — D809 Immunodeficiency with predominantly antibody defects, unspecified: Secondary | ICD-10-CM

## 2013-12-29 LAB — CBC WITH DIFFERENTIAL/PLATELET
BASO%: 0.1 % (ref 0.0–2.0)
Basophils Absolute: 0 10*3/uL (ref 0.0–0.1)
EOS%: 0.1 % (ref 0.0–7.0)
Eosinophils Absolute: 0 10*3/uL (ref 0.0–0.5)
HEMATOCRIT: 39.7 % (ref 38.4–49.9)
HGB: 13 g/dL (ref 13.0–17.1)
LYMPH#: 3 10*3/uL (ref 0.9–3.3)
LYMPH%: 41.2 % (ref 14.0–49.0)
MCH: 30.5 pg (ref 27.2–33.4)
MCHC: 32.7 g/dL (ref 32.0–36.0)
MCV: 93.2 fL (ref 79.3–98.0)
MONO#: 0.7 10*3/uL (ref 0.1–0.9)
MONO%: 9.4 % (ref 0.0–14.0)
NEUT#: 3.6 10*3/uL (ref 1.5–6.5)
NEUT%: 49.2 % (ref 39.0–75.0)
Platelets: 149 10*3/uL (ref 140–400)
RBC: 4.26 10*6/uL (ref 4.20–5.82)
RDW: 15.4 % — ABNORMAL HIGH (ref 11.0–14.6)
WBC: 7.3 10*3/uL (ref 4.0–10.3)
nRBC: 0 % (ref 0–0)

## 2013-12-29 LAB — COMPREHENSIVE METABOLIC PANEL (CC13)
ALT: 51 U/L (ref 0–55)
ANION GAP: 10 meq/L (ref 3–11)
AST: 33 U/L (ref 5–34)
Albumin: 3.7 g/dL (ref 3.5–5.0)
Alkaline Phosphatase: 181 U/L — ABNORMAL HIGH (ref 40–150)
BILIRUBIN TOTAL: 0.29 mg/dL (ref 0.20–1.20)
BUN: 52.5 mg/dL — ABNORMAL HIGH (ref 7.0–26.0)
CALCIUM: 9.4 mg/dL (ref 8.4–10.4)
CO2: 20 meq/L — AB (ref 22–29)
CREATININE: 1.2 mg/dL (ref 0.7–1.3)
Chloride: 107 mEq/L (ref 98–109)
Glucose: 141 mg/dl — ABNORMAL HIGH (ref 70–140)
Potassium: 5.8 mEq/L — ABNORMAL HIGH (ref 3.5–5.1)
Sodium: 137 mEq/L (ref 136–145)
Total Protein: 6.6 g/dL (ref 6.4–8.3)

## 2013-12-29 LAB — MAGNESIUM (CC13): Magnesium: 2 mg/dl (ref 1.5–2.5)

## 2013-12-29 MED ORDER — SODIUM CHLORIDE 0.9 % IJ SOLN
10.0000 mL | INTRAMUSCULAR | Status: DC | PRN
  Administered 2013-12-29: 10 mL via INTRAVENOUS
  Filled 2013-12-29: qty 10

## 2013-12-29 MED ORDER — SODIUM CHLORIDE 0.9 % IV SOLN
Freq: Once | INTRAVENOUS | Status: AC
  Administered 2013-12-29: 12:00:00 via INTRAVENOUS
  Filled 2013-12-29: qty 500

## 2013-12-29 MED ORDER — SODIUM CHLORIDE 0.9 % IV SOLN
Freq: Once | INTRAVENOUS | Status: AC
  Administered 2013-12-29: 11:00:00 via INTRAVENOUS

## 2013-12-29 MED ORDER — LABETALOL HCL 200 MG PO TABS: 200.0000 mg | ORAL_TABLET | Freq: Two times a day (BID) | ORAL | Status: DC

## 2013-12-29 MED ORDER — DENOSUMAB 120 MG/1.7ML ~~LOC~~ SOLN: 120.0000 mg | Freq: Once | SUBCUTANEOUS | Status: DC

## 2013-12-29 MED ORDER — HEPARIN SOD (PORK) LOCK FLUSH 100 UNIT/ML IV SOLN
250.0000 [IU] | Freq: Once | INTRAVENOUS | Status: AC
  Administered 2013-12-29: 250 [IU] via INTRAVENOUS
  Filled 2013-12-29: qty 5

## 2013-12-29 NOTE — Telephone Encounter (Signed)
appts made and printed. Pt is aware that tx will be added. i emailed MW to add the tx's...td

## 2013-12-29 NOTE — Patient Instructions (Signed)
Hypomagnesemia Magnesium is a common ion (mineral) in the body which is needed for metabolism. It is about how the body handles food and other chemical reactions necessary for life. Only about 2% of the magnesium in our body is found in the blood. When this is low, it is called hypomagnesemia. The blood will measure only a tiny amount of the magnesium in our body. When it is low in our blood, it does not mean that the whole body supply is low. The normal serum concentration ranges from 1.8-2.5 mEq/L. When the level gets to be less than 1.0 mEq/L, a number of problems begin to happen.  CAUSES   Receiving intravenous fluids without magnesium replacement.  Loss of magnesium from the bowel by naso-gastric suction.  Loss of magnesium from nausea and vomiting or severe diarrhea. Any of the inflammatory bowel conditions can cause this.  Abuse of alcohol often leads to low serum magnesium.  An inherited form of magnesium loss happens when the kidneys lose magnesium. This is called familial or primary hypomagnesemia.  Some medications such as diuretics also cause the loss of magnesium. SYMPTOMS  These following problems are worse if the changes in magnesium levels come on suddenly.  Tremor.  Confusion.  Muscle weakness.  Over-sensitive to sights and sounds.  Sensitive reflexes.  Depression.  Muscular fibrillations.  Over-reactivity of the nerves.  Irritability.  Psychosis.  Spasms of the hand muscles.  Tetany (where the muscles go into uncontrollable spasms). DIAGNOSIS  This condition can be diagnosed by blood tests. TREATMENT   In emergency, magnesium can be given intravenously (by vein).  If the condition is less worrisome, it can be corrected by diet. High levels of magnesium are found in green leafy vegetables, peas, beans and nuts among other things. It can also be given through medications by mouth.  If it is being caused by medications, changes can be made.  If  alcohol is a problem, help is available if there are difficulties giving it up. Document Released: 09/03/2005 Document Revised: 03/01/2012 Document Reviewed: 07/28/2008 [employer] Patient Information 2014 [employer].        

## 2013-12-29 NOTE — Progress Notes (Signed)
ID: Timothy [name]   DOB: 09/01/1946  MR#: [id number]  HBZ#:[id number]  BOF:BPZW,C DOUGLAS, MD SU: OTHER MD: Timothy [name]; Timothy Shin, MD; Timothy [name], Timothy Hatcher,MD; Timothy Rosenthal, MD; [facility] in [place], [place]:  Timothy Duval, RN [phone])  CHIEF COMPLAINT:  CLL, status post allogeneic stem cell transplant   HISTORY OF PRESENT ILLNESS: We have very complete records from Dr. Racheal [name] in [place], and in summary:  The patient was initially diagnosed in August 2000, with a white cell count of 23,600, but normal hemoglobin and platelets, and no significant symptomatology. Over the next several years his white cell count drifted up, and he eventually developed some symptoms of night sweats in particular, leading to treatment with fludarabine, Cytoxan and rituxan for five cycles given between December 2006 and May 2007.  We have CT scans from June 2006, November 2006 and April 2007, and comparing the November 2006 and April 2007 scans, there was near complete response. He had subsequent therapy in [place] as detailed below, but with decreased response, leading to allogeneic stem-cell transplant at the [facility] 02/24/2012.  INTERVAL HISTORY: Timothy [name] returns with his wife Timothy [name]  for followup of his CLL.  He continues to receive IV fluids with IV magnesium on a weekly basis and is due for his next infusion today. He receives IVIG as needed, last given 12/01/2013 for an IgG level of 310.  He also recently began receiving denosumab for osteoporosis, with the first injection given on 12/08/2013, the plan being to give on an annual basis from this point forward.  Timothy [name] is feeling well today.  His biggest setback has been a recent problem with sciatica. This began in the left hip, and a cortisone injection took care of the pain quickly. He is now had 2 injections in the right hip with minimal relief. This is limiting some of his  exercise, and is making him unable to participate currently in physical therapy.  He is taking pain medication as needed with good relief. He has OxyContin and hydrocodone/APAP as prescribed through our office, and Dr. Rush [name] has also prescribed oxycodone. He tells me he is taking no more than 4-6 tablets of pain medication all total on a daily basis.    Otherwise, he continues to follow his blood sugars and his blood pressure daily at home. His blood pressure varies, but he tells me at home it tends to average around 165/88 and was 148/73 here today. Dr. Loanne Timothy [name] is following his diabetes with a hemoglobin A1c, and recently reduced his dose of insulin. I will also mention that he is currently on a dose of 10 mg of prednisone daily, and is ready to take his next step in a slow taper.  REVIEW OF SYSTEMS: Timothy [name] denies any fevers or chills. He has had no rashes or skin changes. He denies any signs of abnormal bruising or bleeding. His appetite is good. She's had no nausea or emesis. His bowels have returned to normal, with no additional diarrhea. He has had one negative C. difficile test on 12/08/2013. He denies any increased cough, increased shortness of breath, chest pain, or palpitations. He's had no abnormal headaches or dizziness. He does continue to have a tremor which is stable. He's had no peripheral swelling. He also denies any jaw pain, dental problems, or recent dental procedures, and has had no oral ulcerations or problems swallowing.   A detailed review of systems today was otherwise stable   PAST MEDICAL HISTORY: Past  Medical History  Diagnosis Date  . 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 Diverticular disease   . Hyperlipidemia   . Obesity   . Hypertension   . Hiatal hernia   . CMV (cytomegalovirus) antibody positive     pre-transplant, with seroconversion x2 pst-transplant  . Right bundle branch block     pre-transplant  . CKD (chronic kidney  disease) stage 2, GFR 60-89 ml/min   . Pancytopenia   . Steroid-induced diabetes   . Atrial fibrillation     post-transplant  . Myopathy   . 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 Leukemia, chronic lymphoid   . 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 Diarrhea in adult patient 12/05/2012    Due to active GVHD  . CLL (chronic lymphocytic leukemia) 12/05/2012    Dx 07/1999; started Rx 12/06  AlloBMT 3/13  . Rash of face 12/05/2012    Due to GVHD  . Hypomagnesemia 01/26/2013  . Left hip pain 12/01/2013    PAST SURGICAL HISTORY: Past Surgical History  Procedure Laterality Date  . Tonsillectomy and adenoidectomy    . Bone marrow transplant    . Flexible sigmoidoscopy  11/17/2012    Procedure: FLEXIBLE SIGMOIDOSCOPY;  Surgeon: Timothy Columbia, MD;  Location: [facility] ENDOSCOPY;  Service: Endoscopy;  Laterality: N/A;  Dr Timothy [name] states will be admitted to rooom 1339 11/16/12  . Esophagogastroduodenoscopy  11/17/2012    Procedure: ESOPHAGOGASTRODUODENOSCOPY (EGD);  Surgeon: Timothy Columbia, MD;  Location: [facility];  Service: Endoscopy;  Laterality: N/A;    FAMILY HISTORY Family History  Problem Relation Age of Onset  . Cancer Father    The patient's father died from complications of chronic lymphocytic leukemia at the age of 52.  It had been diagnosed seven years before when he was 27.  The patient is enrolled in a familial chronic lymphocytic leukemia study out of the [employer].  The patient's mother is 37, alive, unfortunately suffering with dementia, and he has a brother, 49, who is otherwise in fair health.   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  His wife of >40 years, Timothy [name], is a homemaker.  Their daughter, Timothy [name], lives in [place].  She also is a Agricultural engineer.  The patient  has an 42 year old grandson and an 46-year-old granddaughter, and that is really the main reason he moved to this area.  He is a Tourist information centre manager.     ADVANCED DIRECTIVES: In place  HEALTH MAINTENANCE: (Updated January 2015) History  Substance Use Topics  . Smoking status: Never Smoker   . Smokeless tobacco: Never Used  . Alcohol Use: No     Colonoscopy: Nov 2013, Dr. Watt [name]  PSA: Not on file  Bone density:  Feb 2014  Lipid panel: Nov  2014, elevated    Allergies  Allergen Reactions  . Benadryl [Diphenhydramine Hcl]     "Restless leg syndrome"    Current Outpatient Prescriptions  Medication Sig Dispense Refill  . acyclovir (ZOVIRAX) 400 MG tablet Take 2 tablets (800 mg total) by mouth 2 (two) times daily.  120 tablet  3  . B-D UF III MINI PEN NEEDLES 31G X 5 MM MISC       . budesonide (ENTOCORT EC) 3 MG 24 hr capsule Take 1 capsule (3 mg total)  by mouth 3 (three) times daily.  90 capsule  5  . calcium citrate-vitamin D (CITRACAL+D) 315-200 MG-UNIT per tablet Take 1 tablet by mouth 2 (two) times daily.      . cholecalciferol (VITAMIN D) 1000 UNITS tablet Take 1,000 Units by mouth every evening.       . diltiazem (CARDIZEM CD) 240 MG 24 hr capsule TAKE 1 CAPSULE BY MOUTH DAILY  30 capsule  3  . ferrous sulfate 325 (65 FE) MG tablet Take 325 mg by mouth daily with breakfast.      . fluconazole (DIFLUCAN) 100 MG tablet Take 100 mg by mouth daily.      . furosemide (LASIX) 20 MG tablet TAKE 1 TABLET BY MOUTH EVERY MORNING  30 tablet  2  . HYDROcodone-acetaminophen (NORCO) 10-325 MG per tablet Take 1-2 tablets by mouth every 6 (six) hours as needed.  120 tablet  0  . Insulin Aspart Prot & Aspart (70-30) 100 UNIT/ML SUPN Inject 10 Units into the skin daily with breakfast.       . labetalol (NORMODYNE) 200 MG tablet Take 1 tablet (200 mg total) by mouth 2 (two) times daily.  60 tablet  2  . lisinopril (PRINIVIL,ZESTRIL) 10 MG tablet Take 40 mg by mouth every evening.      . Multiple Vitamin  (MULTIVITAMIN WITH MINERALS) TABS tablet Take 1 tablet by mouth daily.      Marland Kitchen omeprazole (PRILOSEC) 20 MG capsule Take 20 mg by mouth daily.      . ONE TOUCH ULTRA TEST test strip       . ONETOUCH DELICA LANCETS 16X MISC       . OxyCODONE (OXYCONTIN) 10 mg T12A 12 hr tablet Take 1 tablet (10 mg total) by mouth every 12 (twelve) hours.  60 tablet  0  . predniSONE (DELTASONE) 10 MG tablet Take 10 mg by mouth daily. Alternate 10 mg with 2.5 mg daily-per pt      . PREVIDENT 5000 PLUS 1.1 % CREA dental cream       . sertraline (ZOLOFT) 50 MG tablet ALTERNATE TAKING 1 TABLET DAILY AND 2 TABLETS DAILY  90 tablet  3  . Sodium Chloride Flush (NORMAL SALINE FLUSH) 0.9 % SOLN 10 mLs by Intracatheter route as needed (to flush Hickman).       Marland Kitchen sulfamethoxazole-trimethoprim (BACTRIM DS) 800-160 MG per tablet Take 1 tablet by mouth daily.      . tacrolimus (PROGRAF) 0.5 MG capsule TAKE 3 CAPSULES BY MOUTH TWO TIMES DAILY  180 capsule  2  . vancomycin (VANCOCIN) 50 mg/mL oral solution Take 2.5 mLs (125 mg total) by mouth every 6 (six) hours.  140 mL  0  . zinc sulfate 220 MG capsule Take 220 mg by mouth daily.      . cholestyramine (QUESTRAN) 4 G packet Take 1 packet by mouth 3 (three) times daily with meals.      . Heparin Lock Flush (HEPARIN FLUSH, PORCINE,) 100 UNIT/ML injection 300 Units by Intracatheter route as needed (to flush Hickman port).       . Lidocaine-Hydrocortisone Ace 3-0.5 % KIT Apply 1 application topically as needed (for pain).       Marland Kitchen oxyCODONE-acetaminophen (PERCOCET/ROXICET) 5-325 MG per tablet       . PROTEIN PO Take 6 g by mouth 3 (three) times daily.      . [DISCONTINUED] insulin aspart (NOVOLOG FLEXPEN) 100 UNIT/ML SOPN FlexPen 18units sq qam, 9units sq qpm, or as directed  15 mL  1   No current facility-administered medications for this visit.   Facility-Administered Medications Ordered in Other Visits  Medication Dose Route Frequency Provider Last Rate Last Dose  . 0.9 %  sodium  chloride infusion   Intravenous Continuous Marquies Wanat G Chiann Goffredo, PA-C 500 mL/hr at 03/12/13 0900    . sodium chloride 0.9 % injection 10 mL  10 mL Intravenous PRN Chauncey Cruel, MD   10 mL at 08/11/12 1606  . sodium chloride 0.9 % injection 10 mL  10 mL Intravenous PRN Chauncey Cruel, MD   10 mL at 12/29/13 1348  . sodium chloride 0.9 % injection 10 mL  10 mL Intravenous PRN Chauncey Cruel, MD   10 mL at 12/29/13 1347    OBJECTIVE: Middle-aged white male in no acute distress Filed Vitals:   12/29/13 0921  BP: 148/73  Pulse: 77  Temp: 97.9 F (36.6 C)  Resp: 18  Body mass index is 29.55 kg/(m^2).  ECOG:  1 Filed Weights   12/29/13 0921  Weight: 188 lb 11.2 oz (85.594 kg)   Physical Exam: HEENT:  Sclerae anicteric.  Dentition is fair. Oropharynx is benign and moist. LUNGS:  Clear to auscultation bilaterally with good excursion.  No wheezes or rhonchi HEART:  Regular rate and rhythm. No murmur  ABDOMEN:  Soft, nontender to palpation.  Positive bowel sounds.  MSK:  No joint swelling. Good range of motion in both the upper and lower extremities.  EXTREMITIES:  No peripheral edema SKIN:  Scattered ecchymoses noted on the upper extremities, stable; no rashes or skin lesions noted today. NEURO:  Nonfocal. Well oriented.  Appropriate affect.   LAB RESULTS: (latest)  IgG   789  12/16/2013  Mag  2.0  12/16/2013   Tacrolimus   13.2 11/24/2013  CBC    Component Value Date/Time   WBC 7.3 12/29/2013 0854   WBC 7.3 08/18/2013 0725   RBC 4.26 12/29/2013 0854   RBC 3.60* 08/18/2013 0725   RBC 3.82* 03/16/2013 1400   HGB 13.0 12/29/2013 0854   HGB 11.2* 08/18/2013 0725   HCT 39.7 12/29/2013 0854   HCT 33.8* 08/18/2013 0725   PLT 149 12/29/2013 0854   PLT 112* 08/18/2013 0725   MCV 93.2 12/29/2013 0854   MCV 93.9 08/18/2013 0725   MCH 30.5 12/29/2013 0854   MCH 31.1 08/18/2013 0725   MCHC 32.7 12/29/2013 0854   MCHC 33.1 08/18/2013 0725   RDW 15.4* 12/29/2013 0854   RDW 14.6 08/18/2013 0725   LYMPHSABS  3.0 12/29/2013 0854   LYMPHSABS 1.4 03/18/2013 0615   MONOABS 0.7 12/29/2013 0854   MONOABS 0.3 03/18/2013 0615   EOSABS 0.0 12/29/2013 0854   EOSABS 0.0 03/18/2013 0615   BASOSABS 0.0 12/29/2013 0854   BASOSABS 0.0 03/18/2013 0615        Chemistry      Component Value Date/Time   NA 137 12/29/2013 0855   NA 131* 06/14/2013 0800   K 5.8* 12/29/2013 0855   K 4.7 06/14/2013 0800   CL 101 06/15/2013 1034   CL 97 06/14/2013 0800   CO2 20* 12/29/2013 0855   CO2 24 06/14/2013 0800   BUN 52.5* 12/29/2013 0855   BUN 38* 06/14/2013 0800   CREATININE 1.2 12/29/2013 0855   CREATININE 0.97 06/14/2013 0800      Component Value Date/Time   CALCIUM 9.4 12/29/2013 0855   CALCIUM 9.1 06/14/2013 0800   ALKPHOS 181* 12/29/2013 0855   ALKPHOS 75 04/18/2013  1218   AST 33 12/29/2013 0855   AST 24 04/18/2013 1218   ALT 51 12/29/2013 0855   ALT 33 04/18/2013 1218   BILITOT 0.29 12/29/2013 0855   BILITOT 0.2* 04/18/2013 1218      STUDIES:  Dg Hip Complete Left  11/24/2013   CLINICAL DATA:  Left hip pain since physical therapy session 2 weeks ago. No known injury. History of leukemia and long-term steroid use.  EXAM: LEFT HIP - COMPLETE 2+ VIEW  COMPARISON:  None.  FINDINGS: Degenerative changes are seen in both hips in the lower lumbar spine. No evidence for acute fracture.  IMPRESSION: No evidence for acute  abnormality.   Electronically Signed   By: Shon Hale M.D.   On: 11/24/2013 16:46     ASSESSMENT: 68 y.o. [place] man with a history of well-differentiated lymphocytic lymphoma/ chronic lymphoid leukemia initially diagnosed in 2000, not requiring intervention until 2006; with multiple chromosomal abnormalities.  His treatment history is as follows:  (1) fludarabine/cyclophosphamide/rituximab x5 completed May 2007.   (2) rituximab for 8 doses October 2010, with partial response   (3) Leustatin and ofatumumab weekly x8 July to September 2011 followed by maintenance ofatumumab maintenance ofatumumab every 2 months, with  initial response but rising counts September 2012   (4) status-post unrelated donor stem-cell transplant 02/24/2012 at the [facility]  (a) conditioning regimen consisted of fludarabine + TBI at 200 cGy, followed by rituximab x27;  (b) CMV reactivation x3 (patient CMV positive, donor negative), s/p ganciclovir treatment; 3d reactivation August 2013, s/p gancyclovir, with negative PCR mid-September 2013; last gancyclovir dose 10/06/2012 (c) Chronic GVHD: involving gut and skin, treated with steroids, tacrolimus and MMF.  MMF was eventually d/c'd and tacrolimus currently at a dose of 1.62m BID (d) atrial fibrillation: resolved on brief amiodarone regimen (e) steroid-induced myopathy: improving  (f) hypomagnesemia: improved after d/c gancyclovir (g) hypogammaglobulinemia: s/p IVIG most recently 01/07/2013. (h) history of elevated triglycerides (606 on 07/14/2012)  (i) adrenal insufficiency: on prednisone and budesonide (j) pancytopenia,resolved  (5) restaging studies September 2013  including CT scans, flow cytometry, and bone marrow biopsy, showed no evidence of residual chronic lymphoid leukemia.  (6) recurrent GVHD (skin rash, mouth changes, severe diarrhea and gastric/duodenal/colonic biopsies 11/17/2012 c/w GVHD grade 2) : now at most grade 1 GI with no significant skin or mouth changes  (7)  malnutrition -- on VITAL supplement in addition to regular diet; on Marinol for anorexia  (8) testosterone deficiency--on patch   (9) deconditioning: ongoing REHAB   (10) mild dehydration: encouraged increased po fluids; receives IVF support w magnesium weekly  (11) steroid-induced osteoporosis with compression fractures: received pamidronate 12/18/2012. Status post kyphoplasty at L3-4 in June 2014. Denosumab started 12/08/2013, to be repeated every 3 months.  (12) chronic back pain and hip pain controlled with OxyContin and hydrocodone/APAP.  (12) nausea: controlled on current meds  (13)   Positive c.diff, 03/08/2013, on Flagyl 500 mg TID x 20 days, then on oral vanco with Questran, showing improvement; positive when repeated April 2014; Negative x 1 on 11/24/2013  (14) persistently increased BUN  (15)  Hypertension, on labetalol, cardizem, lisinopril, and furosemide;Lisinopril increased by Dr. SBrigitte PulseNovember 2014   (16) steroid induced hyperglycemia, on sterlix and 70/30 insulin, hemoglobin A1c 5.2 November of 2014;  Followed by Dr. ELoanne Timothy [name] (17) hypogammaglobulinemia-- requiring intermittent supplementation  (18) squamous cell CA in situ removed from left parietal scalp October 2014   PLAN: TErminiocontinues to do well, and I'm making no change  in his current regimen today, other than decreasing his prednisone slightly. He is ready to decrease to a 5 mg dose in the morning, with 2.5 mg in the evening. Will remain on that dose for at least 1 month, then consider reducing to 5 mg only in the morning.   He'll receive IV fluids and magnesium on a weekly basis as before, and we'll see him approximately every other week for followup visit. He has a return appointment in [place] on March 9, and we'll see Dr. Jana Hakim soon thereafter to review their recommendations.  Coralyn Mark and Chapin both voice understanding and agreement with our plan today. He knows to call for any problems that may develop before the next visit here.    Donnia Poplaski, PA-C      12/29/2013

## 2013-12-29 NOTE — Telephone Encounter (Signed)
Per staff message and POF I have scheduled appts.  JMW  

## 2013-12-30 LAB — IGG, IGA, IGM
IgG (Immunoglobin G), Serum: 583 mg/dL — ABNORMAL LOW (ref 650–1600)
IgM, Serum: 5 mg/dL — ABNORMAL LOW (ref 41–251)

## 2014-01-04 ENCOUNTER — Other Ambulatory Visit: Payer: Self-pay | Admitting: Oncology

## 2014-01-05 ENCOUNTER — Ambulatory Visit (HOSPITAL_BASED_OUTPATIENT_CLINIC_OR_DEPARTMENT_OTHER): Payer: BC Managed Care – PPO

## 2014-01-05 ENCOUNTER — Other Ambulatory Visit (HOSPITAL_BASED_OUTPATIENT_CLINIC_OR_DEPARTMENT_OTHER): Payer: BC Managed Care – PPO

## 2014-01-05 DIAGNOSIS — T8609 Other complications of bone marrow transplant: Secondary | ICD-10-CM

## 2014-01-05 DIAGNOSIS — C9110 Chronic lymphocytic leukemia of B-cell type not having achieved remission: Secondary | ICD-10-CM

## 2014-01-05 DIAGNOSIS — D89811 Chronic graft-versus-host disease: Secondary | ICD-10-CM

## 2014-01-05 DIAGNOSIS — Z9489 Other transplanted organ and tissue status: Secondary | ICD-10-CM

## 2014-01-05 LAB — CBC WITH DIFFERENTIAL/PLATELET
BASO%: 0.4 % (ref 0.0–2.0)
Basophils Absolute: 0 10*3/uL (ref 0.0–0.1)
EOS%: 0.3 % (ref 0.0–7.0)
Eosinophils Absolute: 0 10*3/uL (ref 0.0–0.5)
HCT: 37.1 % — ABNORMAL LOW (ref 38.4–49.9)
HGB: 12.4 g/dL — ABNORMAL LOW (ref 13.0–17.1)
LYMPH#: 3.4 10*3/uL — AB (ref 0.9–3.3)
LYMPH%: 35.2 % (ref 14.0–49.0)
MCH: 31.4 pg (ref 27.2–33.4)
MCHC: 33.3 g/dL (ref 32.0–36.0)
MCV: 94.2 fL (ref 79.3–98.0)
MONO#: 0.7 10*3/uL (ref 0.1–0.9)
MONO%: 7.4 % (ref 0.0–14.0)
NEUT#: 5.4 10*3/uL (ref 1.5–6.5)
NEUT%: 56.7 % (ref 39.0–75.0)
Platelets: 171 10*3/uL (ref 140–400)
RBC: 3.94 10*6/uL — ABNORMAL LOW (ref 4.20–5.82)
RDW: 15.6 % — AB (ref 11.0–14.6)
WBC: 9.6 10*3/uL (ref 4.0–10.3)

## 2014-01-05 LAB — COMPREHENSIVE METABOLIC PANEL (CC13)
ALT: 55 U/L (ref 0–55)
AST: 33 U/L (ref 5–34)
Albumin: 3.5 g/dL (ref 3.5–5.0)
Alkaline Phosphatase: 192 U/L — ABNORMAL HIGH (ref 40–150)
Anion Gap: 9 mEq/L (ref 3–11)
BUN: 46.5 mg/dL — ABNORMAL HIGH (ref 7.0–26.0)
CALCIUM: 8.6 mg/dL (ref 8.4–10.4)
CHLORIDE: 112 meq/L — AB (ref 98–109)
CO2: 18 mEq/L — ABNORMAL LOW (ref 22–29)
CREATININE: 1.1 mg/dL (ref 0.7–1.3)
Glucose: 167 mg/dl — ABNORMAL HIGH (ref 70–140)
Potassium: 5.5 mEq/L — ABNORMAL HIGH (ref 3.5–5.1)
Sodium: 138 mEq/L (ref 136–145)
Total Bilirubin: 0.31 mg/dL (ref 0.20–1.20)
Total Protein: 6.1 g/dL — ABNORMAL LOW (ref 6.4–8.3)

## 2014-01-05 LAB — MAGNESIUM (CC13): Magnesium: 2.1 mg/dl (ref 1.5–2.5)

## 2014-01-05 MED ORDER — HEPARIN SOD (PORK) LOCK FLUSH 100 UNIT/ML IV SOLN
500.0000 [IU] | Freq: Once | INTRAVENOUS | Status: AC
  Administered 2014-01-05: 250 [IU] via INTRAVENOUS
  Filled 2014-01-05: qty 5

## 2014-01-05 MED ORDER — SODIUM CHLORIDE 0.9 % IJ SOLN
10.0000 mL | INTRAMUSCULAR | Status: DC | PRN
  Administered 2014-01-05: 10 mL via INTRAVENOUS
  Filled 2014-01-05: qty 10

## 2014-01-05 MED ORDER — SODIUM CHLORIDE 0.9 % IV SOLN
Freq: Once | INTRAVENOUS | Status: AC
  Administered 2014-01-05: 11:00:00 via INTRAVENOUS
  Filled 2014-01-05: qty 500

## 2014-01-05 NOTE — Patient Instructions (Signed)
Hypomagnesemia Magnesium is a common ion (mineral) in the body which is needed for metabolism. It is about how the body handles food and other chemical reactions necessary for life. Only about 2% of the magnesium in our body is found in the blood. When this is low, it is called hypomagnesemia. The blood will measure only a tiny amount of the magnesium in our body. When it is low in our blood, it does not mean that the whole body supply is low. The normal serum concentration ranges from 1.8-2.5 mEq/L. When the level gets to be less than 1.0 mEq/L, a number of problems begin to happen.  CAUSES   Receiving intravenous fluids without magnesium replacement.  Loss of magnesium from the bowel by naso-gastric suction.  Loss of magnesium from nausea and vomiting or severe diarrhea. Any of the inflammatory bowel conditions can cause this.  Abuse of alcohol often leads to low serum magnesium.  An inherited form of magnesium loss happens when the kidneys lose magnesium. This is called familial or primary hypomagnesemia.  Some medications such as diuretics also cause the loss of magnesium. SYMPTOMS  These following problems are worse if the changes in magnesium levels come on suddenly.  Tremor.  Confusion.  Muscle weakness.  Over-sensitive to sights and sounds.  Sensitive reflexes.  Depression.  Muscular fibrillations.  Over-reactivity of the nerves.  Irritability.  Psychosis.  Spasms of the hand muscles.  Tetany (where the muscles go into uncontrollable spasms). DIAGNOSIS  This condition can be diagnosed by blood tests. TREATMENT   In emergency, magnesium can be given intravenously (by vein).  If the condition is less worrisome, it can be corrected by diet. High levels of magnesium are found in green leafy vegetables, peas, beans and nuts among other things. It can also be given through medications by mouth.  If it is being caused by medications, changes can be made.  If  alcohol is a problem, help is available if there are difficulties giving it up. Document Released: 09/03/2005 Document Revised: 03/01/2012 Document Reviewed: 07/28/2008 [employer] Patient Information 2014 [employer].        

## 2014-01-06 LAB — TACROLIMUS LEVEL: TACROLIMUS LVL: 6.8 ng/mL (ref 5.0–20.0)

## 2014-01-11 ENCOUNTER — Other Ambulatory Visit: Payer: Self-pay | Admitting: Physician Assistant

## 2014-01-12 ENCOUNTER — Encounter: Payer: Self-pay | Admitting: Physician Assistant

## 2014-01-12 ENCOUNTER — Ambulatory Visit (HOSPITAL_BASED_OUTPATIENT_CLINIC_OR_DEPARTMENT_OTHER): Payer: BC Managed Care – PPO

## 2014-01-12 ENCOUNTER — Other Ambulatory Visit (HOSPITAL_BASED_OUTPATIENT_CLINIC_OR_DEPARTMENT_OTHER): Payer: BC Managed Care – PPO

## 2014-01-12 ENCOUNTER — Ambulatory Visit (HOSPITAL_BASED_OUTPATIENT_CLINIC_OR_DEPARTMENT_OTHER): Payer: BC Managed Care – PPO | Admitting: Physician Assistant

## 2014-01-12 DIAGNOSIS — D899 Disorder involving the immune mechanism, unspecified: Secondary | ICD-10-CM

## 2014-01-12 DIAGNOSIS — E099 Drug or chemical induced diabetes mellitus without complications: Secondary | ICD-10-CM

## 2014-01-12 DIAGNOSIS — D649 Anemia, unspecified: Secondary | ICD-10-CM

## 2014-01-12 DIAGNOSIS — T8609 Other complications of bone marrow transplant: Secondary | ICD-10-CM

## 2014-01-12 DIAGNOSIS — Z9489 Other transplanted organ and tissue status: Secondary | ICD-10-CM

## 2014-01-12 DIAGNOSIS — I10 Essential (primary) hypertension: Secondary | ICD-10-CM

## 2014-01-12 DIAGNOSIS — D849 Immunodeficiency, unspecified: Secondary | ICD-10-CM

## 2014-01-12 DIAGNOSIS — N182 Chronic kidney disease, stage 2 (mild): Secondary | ICD-10-CM

## 2014-01-12 DIAGNOSIS — IMO0001 Reserved for inherently not codable concepts without codable children: Secondary | ICD-10-CM

## 2014-01-12 DIAGNOSIS — E119 Type 2 diabetes mellitus without complications: Secondary | ICD-10-CM

## 2014-01-12 DIAGNOSIS — T380X5A Adverse effect of glucocorticoids and synthetic analogues, initial encounter: Secondary | ICD-10-CM

## 2014-01-12 DIAGNOSIS — D89811 Chronic graft-versus-host disease: Secondary | ICD-10-CM

## 2014-01-12 DIAGNOSIS — D801 Nonfamilial hypogammaglobulinemia: Secondary | ICD-10-CM

## 2014-01-12 DIAGNOSIS — R894 Abnormal immunological findings in specimens from other organs, systems and tissues: Secondary | ICD-10-CM

## 2014-01-12 DIAGNOSIS — E860 Dehydration: Secondary | ICD-10-CM

## 2014-01-12 DIAGNOSIS — R768 Other specified abnormal immunological findings in serum: Secondary | ICD-10-CM

## 2014-01-12 DIAGNOSIS — M25551 Pain in right hip: Secondary | ICD-10-CM

## 2014-01-12 DIAGNOSIS — R259 Unspecified abnormal involuntary movements: Secondary | ICD-10-CM

## 2014-01-12 DIAGNOSIS — C9110 Chronic lymphocytic leukemia of B-cell type not having achieved remission: Secondary | ICD-10-CM

## 2014-01-12 DIAGNOSIS — E785 Hyperlipidemia, unspecified: Secondary | ICD-10-CM

## 2014-01-12 LAB — CBC WITH DIFFERENTIAL/PLATELET
BASO%: 0.1 % (ref 0.0–2.0)
BASOS ABS: 0 10*3/uL (ref 0.0–0.1)
EOS ABS: 0 10*3/uL (ref 0.0–0.5)
EOS%: 0.3 % (ref 0.0–7.0)
HCT: 37.5 % — ABNORMAL LOW (ref 38.4–49.9)
HEMOGLOBIN: 12.4 g/dL — AB (ref 13.0–17.1)
LYMPH%: 26.4 % (ref 14.0–49.0)
MCH: 30.9 pg (ref 27.2–33.4)
MCHC: 33.1 g/dL (ref 32.0–36.0)
MCV: 93.5 fL (ref 79.3–98.0)
MONO#: 0.7 10*3/uL (ref 0.1–0.9)
MONO%: 6.9 % (ref 0.0–14.0)
NEUT%: 66.3 % (ref 39.0–75.0)
NEUTROS ABS: 7.1 10*3/uL — AB (ref 1.5–6.5)
Platelets: 148 10*3/uL (ref 140–400)
RBC: 4.01 10*6/uL — AB (ref 4.20–5.82)
RDW: 15.6 % — ABNORMAL HIGH (ref 11.0–14.6)
WBC: 10.7 10*3/uL — ABNORMAL HIGH (ref 4.0–10.3)
lymph#: 2.8 10*3/uL (ref 0.9–3.3)
nRBC: 0 % (ref 0–0)

## 2014-01-12 LAB — COMPREHENSIVE METABOLIC PANEL (CC13)
ALBUMIN: 3.6 g/dL (ref 3.5–5.0)
ALT: 40 U/L (ref 0–55)
ANION GAP: 8 meq/L (ref 3–11)
AST: 32 U/L (ref 5–34)
Alkaline Phosphatase: 173 U/L — ABNORMAL HIGH (ref 40–150)
BILIRUBIN TOTAL: 0.29 mg/dL (ref 0.20–1.20)
BUN: 38.1 mg/dL — ABNORMAL HIGH (ref 7.0–26.0)
CALCIUM: 9 mg/dL (ref 8.4–10.4)
CHLORIDE: 109 meq/L (ref 98–109)
CO2: 21 mEq/L — ABNORMAL LOW (ref 22–29)
Creatinine: 0.9 mg/dL (ref 0.7–1.3)
GLUCOSE: 136 mg/dL (ref 70–140)
POTASSIUM: 5.4 meq/L — AB (ref 3.5–5.1)
Sodium: 138 mEq/L (ref 136–145)
Total Protein: 6.1 g/dL — ABNORMAL LOW (ref 6.4–8.3)

## 2014-01-12 LAB — MAGNESIUM (CC13): MAGNESIUM: 1.7 mg/dL (ref 1.5–2.5)

## 2014-01-12 MED ORDER — ACYCLOVIR 400 MG PO TABS: 800.0000 mg | ORAL_TABLET | Freq: Two times a day (BID) | ORAL | Status: DC

## 2014-01-12 MED ORDER — HEPARIN SOD (PORK) LOCK FLUSH 100 UNIT/ML IV SOLN
500.0000 [IU] | Freq: Once | INTRAVENOUS | Status: AC
  Administered 2014-01-12: 250 [IU] via INTRAVENOUS
  Filled 2014-01-12: qty 5

## 2014-01-12 MED ORDER — SODIUM CHLORIDE 0.9 % IV SOLN
Freq: Once | INTRAVENOUS | Status: AC
  Administered 2014-01-12: 14:00:00 via INTRAVENOUS
  Filled 2014-01-12: qty 500

## 2014-01-12 MED ORDER — SODIUM CHLORIDE 0.9 % IJ SOLN
10.0000 mL | INTRAMUSCULAR | Status: DC | PRN
  Administered 2014-01-12: 10 mL via INTRAVENOUS
  Filled 2014-01-12: qty 10

## 2014-01-12 NOTE — Progress Notes (Signed)
ID: [name]   DOB: [birth year]  MR#: [id number]  BVQ#:[id number]  CMK:LKJZ,P DOUGLAS, MD SU: OTHER MD: Myrtice Lauth; Romero Belling, MD; Lorina Rabon, [name],MD; Doneen Poisson, MD; [facility] in [place], [place]:  Janett Billow, RN (FAX #[phone])  CHIEF COMPLAINT:  CLL, status post allogeneic stem cell transplant   HISTORY OF PRESENT ILLNESS: We have very complete records from Dr. Sydnee Levans in [place], and in summary:  The patient was initially diagnosed in August 2000, with a white cell count of 23,600, but normal hemoglobin and platelets, and no significant symptomatology. Over the next several years his white cell count drifted up, and he eventually developed some symptoms of night sweats in particular, leading to treatment with fludarabine, Cytoxan and rituxan for five cycles given between December 2006 and May 2007.  We have CT scans from June 2006, November 2006 and April 2007, and comparing the November 2006 and April 2007 scans, there was near complete response. He had subsequent therapy in [place] as detailed below, but with decreased response, leading to allogeneic stem-cell transplant at the [facility] 02/24/2012.  INTERVAL HISTORY: Joshuah was seen in the infusion room today while receiving his IV infusion of magnesium for followup of his CLL. He is accompanied by his wife [name].  He continues to receive IV fluids with IV magnesium on a weekly basis. He receives IVIG as needed, last given 12/01/2013 for an IgG level of 310. (I will mention that his physician in [place] has requested that we give no additional IgG between now and his next appointment there in early March.)   Marcelle is feeling well today with no new complaints. He continues to have some pain in the right hip associated with sciatica. A recent cortisone injection gave him minimal relief, and he is now using topical voltaren cream with some relief. This  pain has decreased his mobility, and he has taken a break from physical therapy. Subsequently, he has begun having a little more swelling in the feet and ankles. He is trying to keep him propped up, and is drinking plenty of water.  Dr. Everardo All continues to follow his diabetes and recently reduced his dose of insulin. I will also mention that he is currently on a dose of 7.5 mg of prednisone daily, and 5 mg in the morning and 2.5 mg in the evening.  We did note that Cloy's white cell count and ANC are both a little elevated today at 10.7 and 7.1 respectively. He denies any known illnesses, has felt well this week, and denies any fevers or chills.  REVIEW OF SYSTEMS: Lamoyne  has had no rashes or skin changes. Although he does bruise easily, he denies any signs of abnormal bruising or bleeding. His appetite is good. He's had no nausea or emesis. He denies any diarrhea, although he tells me his stools have been a little softer this week. He averages 3 bowel movements daily, and denies any blood or mucus in the stool. He has had one negative C. difficile test on 12/08/2013. He's had some sinus congestion this week. He denies any increased cough, increased shortness of breath, chest pain, or palpitations. He's had no abnormal headaches or dizziness. He does continue to have a tremor which is stable.  He also denies any jaw pain, dental problems, or recent dental procedures, and has had no oral ulcerations or problems swallowing.   A detailed review of systems today was otherwise stable   PAST MEDICAL HISTORY: Past  Medical History  Diagnosis Date  . 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 Diverticular disease   . Hyperlipidemia   . Obesity   . Hypertension   . Hiatal hernia   . CMV (cytomegalovirus) antibody positive     pre-transplant, with seroconversion x2 pst-transplant  . Right bundle branch block     pre-transplant  . CKD (chronic kidney disease) stage 2, GFR  60-89 ml/min   . Pancytopenia   . Steroid-induced diabetes   . Atrial fibrillation     post-transplant  . Myopathy   . 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 Leukemia, chronic lymphoid   . 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 Diarrhea in adult patient 12/05/2012    Due to active GVHD  . CLL (chronic lymphocytic leukemia) 12/05/2012    Dx 07/1999; started Rx 12/06  AlloBMT 3/13  . Rash of face 12/05/2012    Due to GVHD  . Hypomagnesemia 01/26/2013  . Left hip pain 12/01/2013    PAST SURGICAL HISTORY: Past Surgical History  Procedure Laterality Date  . Tonsillectomy and adenoidectomy    . Bone marrow transplant    . Flexible sigmoidoscopy  11/17/2012    Procedure: FLEXIBLE SIGMOIDOSCOPY;  Surgeon: Jeryl Columbia, MD;  Location: [facility];  Service: Endoscopy;  Laterality: N/A;  Dr Watt Climes states will be admitted to rooom 1339 11/16/12  . Esophagogastroduodenoscopy  11/17/2012    Procedure: ESOPHAGOGASTRODUODENOSCOPY (EGD);  Surgeon: Jeryl Columbia, MD;  Location: [facility];  Service: Endoscopy;  Laterality: N/A;    FAMILY HISTORY Family History  Problem Relation Age of Onset  . Cancer Father    The patient's father died from complications of chronic lymphocytic leukemia at the age of 21.  It had been diagnosed seven years before when he was 58.  The patient is enrolled in a familial chronic lymphocytic leukemia study out of the [employer].  The patient's mother is 66, alive, unfortunately suffering with dementia, and he has a brother, 4, who is otherwise in fair health.   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  His wife of >40 years, [name], is a homemaker.  Their daughter, [name], lives in [place].  She also is a Agricultural engineer.  The patient has an 36 year old  grandson and an 37-year-old granddaughter, and that is really the main reason he moved to this area.  He is a Tourist information centre manager.     ADVANCED DIRECTIVES: In place  HEALTH MAINTENANCE: (Updated January 2015) History  Substance Use Topics  . Smoking status: Never Smoker   . Smokeless tobacco: Never Used  . Alcohol Use: No     Colonoscopy: Nov 2013, Dr. Watt Climes  PSA: Not on file  Bone density:  Feb 2014  Lipid panel: Nov  2014, elevated    Allergies  Allergen Reactions  . Benadryl [Diphenhydramine Hcl]     "Restless leg syndrome"    Current Outpatient Prescriptions  Medication Sig Dispense Refill  . acyclovir (ZOVIRAX) 400 MG tablet Take 2 tablets (800 mg total) by mouth 2 (two) times daily.  120 tablet  3  . B-D UF III MINI PEN NEEDLES 31G X 5 MM MISC       . budesonide (ENTOCORT EC) 3 MG 24 hr capsule Take 1 capsule (3 mg total)  by mouth 3 (three) times daily.  90 capsule  5  . calcium citrate-vitamin D (CITRACAL+D) 315-200 MG-UNIT per tablet Take 1 tablet by mouth 2 (two) times daily.      . cholecalciferol (VITAMIN D) 1000 UNITS tablet Take 1,000 Units by mouth every evening.       . cholestyramine (QUESTRAN) 4 G packet Take 1 packet by mouth 3 (three) times daily with meals.      Marland Kitchen diltiazem (CARDIZEM CD) 240 MG 24 hr capsule TAKE 1 CAPSULE BY MOUTH DAILY  30 capsule  3  . ferrous sulfate 325 (65 FE) MG tablet Take 325 mg by mouth daily with breakfast.      . fluconazole (DIFLUCAN) 100 MG tablet Take 100 mg by mouth daily.      . furosemide (LASIX) 20 MG tablet TAKE 1 TABLET BY MOUTH EVERY MORNING  30 tablet  2  . Heparin Lock Flush (HEPARIN FLUSH, PORCINE,) 100 UNIT/ML injection 300 Units by Intracatheter route as needed (to flush Hickman port).       Marland Kitchen HYDROcodone-acetaminophen (NORCO) 10-325 MG per tablet Take 1-2 tablets by mouth every 6 (six) hours as needed.  120 tablet  0  . Insulin Aspart Prot & Aspart (70-30) 100 UNIT/ML SUPN Inject 10 Units into the skin daily with breakfast.        . labetalol (NORMODYNE) 200 MG tablet Take 1 tablet (200 mg total) by mouth 2 (two) times daily.  60 tablet  2  . Lidocaine-Hydrocortisone Ace 3-0.5 % KIT Apply 1 application topically as needed (for pain).       Marland Kitchen lisinopril (PRINIVIL,ZESTRIL) 10 MG tablet Take 40 mg by mouth every evening.      . Multiple Vitamin (MULTIVITAMIN WITH MINERALS) TABS tablet Take 1 tablet by mouth daily.      Marland Kitchen omeprazole (PRILOSEC) 20 MG capsule Take 20 mg by mouth daily.      . ONE TOUCH ULTRA TEST test strip       . ONETOUCH DELICA LANCETS 33G MISC       . OxyCODONE (OXYCONTIN) 10 mg T12A 12 hr tablet Take 1 tablet (10 mg total) by mouth every 12 (twelve) hours.  60 tablet  0  . oxyCODONE-acetaminophen (PERCOCET/ROXICET) 5-325 MG per tablet       . predniSONE (DELTASONE) 10 MG tablet Take 5 mg by mouth 2 (two) times daily. 5 mg in AM and 2.5 mg in PM, as of 12/29/2013      . PREVIDENT 5000 PLUS 1.1 % CREA dental cream       . PROTEIN PO Take 6 g by mouth 3 (three) times daily.      . sertraline (ZOLOFT) 50 MG tablet ALTERNATE TAKING 1 TABLET DAILY AND 2 TABLETS DAILY  90 tablet  3  . Sodium Chloride Flush (NORMAL SALINE FLUSH) 0.9 % SOLN 10 mLs by Intracatheter route as needed (to flush Hickman).       Marland Kitchen sulfamethoxazole-trimethoprim (BACTRIM DS) 800-160 MG per tablet Take 1 tablet by mouth daily.      . tacrolimus (PROGRAF) 0.5 MG capsule TAKE 3 CAPSULES BY MOUTH TWO TIMES DAILY  180 capsule  2  . vancomycin (VANCOCIN) 50 mg/mL oral solution Take 2.5 mLs (125 mg total) by mouth every 6 (six) hours.  140 mL  0  . zinc sulfate 220 MG capsule Take 220 mg by mouth daily.      . [DISCONTINUED] insulin aspart (NOVOLOG FLEXPEN) 100 UNIT/ML SOPN FlexPen 18units sq qam,  9units sq qpm, or as directed  15 mL  1   No current facility-administered medications for this visit.   Facility-Administered Medications Ordered in Other Visits  Medication Dose Route Frequency Provider Last Rate Last Dose  . 0.9 %  sodium  chloride infusion   Intravenous Continuous Yailene Badia G Jaspreet Bodner, PA-C 500 mL/hr at 03/12/13 0900    . sodium chloride 0.9 % injection 10 mL  10 mL Intravenous PRN Chauncey Cruel, MD   10 mL at 08/11/12 1606  . sodium chloride 0.9 % injection 10 mL  10 mL Intravenous PRN Chauncey Cruel, MD   10 mL at 01/12/14 1530    OBJECTIVE: Middle-aged white male seen in the infusion room while receiving IV fluids. He appears comfortable and is in no acute distress. Vital Signs: BP: 155/74, right arm sitting Pulse: 76 Respirations: 20 Temp: 98.3 Weight: 188 pounds, 11.2 ounces (as of 12/29/2013) Height: 5 feet 7 inches (as of 12/29/2013) BMI: 29.6 (as of 12/29/2013)  Remainder of physical exam was deferred today.     LAB RESULTS: (latest)  IgG   789  12/16/2013  Mag   1.7  01/12/2014  Tacrolimus   6.8 01/05/2014  CBC    Component Value Date/Time   WBC 10.7* 01/12/2014 1348   WBC 7.3 08/18/2013 0725   RBC 4.01* 01/12/2014 1348   RBC 3.60* 08/18/2013 0725   RBC 3.82* 03/16/2013 1400   HGB 12.4* 01/12/2014 1348   HGB 11.2* 08/18/2013 0725   HCT 37.5* 01/12/2014 1348   HCT 33.8* 08/18/2013 0725   PLT 148 01/12/2014 1348   PLT 112* 08/18/2013 0725   MCV 93.5 01/12/2014 1348   MCV 93.9 08/18/2013 0725   MCH 30.9 01/12/2014 1348   MCH 31.1 08/18/2013 0725   MCHC 33.1 01/12/2014 1348   MCHC 33.1 08/18/2013 0725   RDW 15.6* 01/12/2014 1348   RDW 14.6 08/18/2013 0725   LYMPHSABS 2.8 01/12/2014 1348   LYMPHSABS 1.4 03/18/2013 0615   MONOABS 0.7 01/12/2014 1348   MONOABS 0.3 03/18/2013 0615   EOSABS 0.0 01/12/2014 1348   EOSABS 0.0 03/18/2013 0615   BASOSABS 0.0 01/12/2014 1348   BASOSABS 0.0 03/18/2013 0615        Chemistry      Component Value Date/Time   NA 138 01/12/2014 1348   NA 131* 06/14/2013 0800   K 5.4* 01/12/2014 1348   K 4.7 06/14/2013 0800   CL 101 06/15/2013 1034   CL 97 06/14/2013 0800   CO2 21* 01/12/2014 1348   CO2 24 06/14/2013 0800   BUN 38.1* 01/12/2014 1348   BUN 38* 06/14/2013 0800    CREATININE 0.9 01/12/2014 1348   CREATININE 0.97 06/14/2013 0800      Component Value Date/Time   CALCIUM 9.0 01/12/2014 1348   CALCIUM 9.1 06/14/2013 0800   ALKPHOS 173* 01/12/2014 1348   ALKPHOS 75 04/18/2013 1218   AST 32 01/12/2014 1348   AST 24 04/18/2013 1218   ALT 40 01/12/2014 1348   ALT 33 04/18/2013 1218   BILITOT 0.29 01/12/2014 1348   BILITOT 0.2* 04/18/2013 1218      STUDIES:  Dg Hip Complete Left  11/24/2013   CLINICAL DATA:  Left hip pain since physical therapy session 2 weeks ago. No known injury. History of leukemia and long-term steroid use.  EXAM: LEFT HIP - COMPLETE 2+ VIEW  COMPARISON:  None.  FINDINGS: Degenerative changes are seen in both hips in the lower lumbar spine. No evidence for  acute fracture.  IMPRESSION: No evidence for acute  abnormality.   Electronically Signed   By: Shon Hale M.D.   On: 11/24/2013 16:46     ASSESSMENT: 68 y.o. [place] man with a history of well-differentiated lymphocytic lymphoma/ chronic lymphoid leukemia initially diagnosed in 2000, not requiring intervention until 2006; with multiple chromosomal abnormalities.  His treatment history is as follows:  (1) fludarabine/cyclophosphamide/rituximab x5 completed May 2007.   (2) rituximab for 8 doses October 2010, with partial response   (3) Leustatin and ofatumumab weekly x8 July to September 2011 followed by maintenance ofatumumab maintenance ofatumumab every 2 months, with initial response but rising counts September 2012   (4) status-post unrelated donor stem-cell transplant 02/24/2012 at [facility]  (a) conditioning regimen consisted of fludarabine + TBI at 200 cGy, followed by rituximab x27;  (b) CMV reactivation x3 (patient CMV positive, donor negative), s/p ganciclovir treatment; 3d reactivation August 2013, s/p gancyclovir, with negative PCR mid-September 2013; last gancyclovir dose 10/06/2012 (c) Chronic GVHD: involving gut and skin, treated with steroids, tacrolimus and MMF.  MMF  was eventually d/c'd and tacrolimus currently at a dose of 1.$RemoveB'5mg'[id number]$  BID (d) atrial fibrillation: resolved on brief amiodarone regimen (e) steroid-induced myopathy: improving  (f) hypomagnesemia: improved after d/c gancyclovir (g) hypogammaglobulinemia: s/p IVIG most recently 01/07/2013. (h) history of elevated triglycerides (606 on 07/14/2012)  (i) adrenal insufficiency: on prednisone and budesonide (j) pancytopenia,resolved  (5) restaging studies September 2013  including CT scans, flow cytometry, and bone marrow biopsy, showed no evidence of residual chronic lymphoid leukemia.  (6) recurrent GVHD (skin rash, mouth changes, severe diarrhea and gastric/duodenal/colonic biopsies 11/17/2012 c/w GVHD grade 2) : now at most grade 1 GI with no significant skin or mouth changes  (7)  malnutrition -- on VITAL supplement in addition to regular diet; on Marinol for anorexia  (8) testosterone deficiency--on patch   (9) deconditioning: ongoing REHAB   (10) mild dehydration: encouraged increased po fluids; receives IVF support w magnesium weekly  (11) steroid-induced osteoporosis with compression fractures: received pamidronate 12/18/2012. Status post kyphoplasty at L3-4 in June 2014. Denosumab started 12/08/2013, to be repeated every 3 months.  (12) chronic back pain and hip pain controlled with OxyContin and hydrocodone/APAP.  (12) nausea: controlled on current meds  (13)  Positive c.diff, 03/08/2013, on Flagyl 500 mg TID x 20 days, then on oral vanco with Questran, showing improvement; positive when repeated April 2014; Negative x 1 on 11/24/2013  (14) persistently increased BUN  (15)  Hypertension, on labetalol, cardizem, lisinopril, and furosemide;Lisinopril increased by Dr. Brigitte Pulse November 2014   (16) steroid induced hyperglycemia, on sterlix and 70/30 insulin, hemoglobin A1c 5.2 November of 2014;  Followed by Dr. Loanne Drilling  (85) hypogammaglobulinemia-- requiring intermittent  supplementation  (18) squamous cell CA in situ removed from left parietal scalp October 2014   PLAN: Our entire 15 minute appointment today was spent answering the patient's questions, discussing his treatment plan, and coordinating care.   Badr  appears to be doing well with regards to his CLL. I'm making no changes in his current medications. I am refilling his acyclovir per his request. He'll continue to receive IV fluids and IV magnesium on a weekly basis, and we'll see him approximately every other week for followup visit. We will repeat his CBC next week and reassess his elevation in white count and ANC.    He'll continue on his current dose of prednisone, 5 mg in the morning and 2.5 mg in the evening, for the next  couple of weeks until he returns to see Dr. Jana Hakim on February 12. At that point, we will likely be reducing his dose to 5 mg only in the morning.   He does have a history of hyperlipidemia, and would like to recheck his fasting cholesterol levels. We will add these to his labs next week. We would also like a total of 3 negative c diffs before discontinuing his vancomycin. If he has a loose stool this coming week, he will bring a sample for testing. These orders have been entered.  Alter has a return appointment in [place] on March 9, and will see Dr. Jana Hakim soon thereafter to review their recommendations.  As noted above, his physician in  has requested that we give no additional IgG between now and March.  Coralyn Mark and Ethete both voice understanding and agreement with our plan today. He knows to call for any problems that may develop before the next visit here.    Micah Flesher, PA-C      01/12/2014

## 2014-01-12 NOTE — Patient Instructions (Signed)
Hypomagnesemia Magnesium is a common ion (mineral) in the body which is needed for metabolism. It is about how the body handles food and other chemical reactions necessary for life. Only about 2% of the magnesium in our body is found in the blood. When this is low, it is called hypomagnesemia. The blood will measure only a tiny amount of the magnesium in our body. When it is low in our blood, it does not mean that the whole body supply is low. The normal serum concentration ranges from 1.8-2.5 mEq/L. When the level gets to be less than 1.0 mEq/L, a number of problems begin to happen.  CAUSES   Receiving intravenous fluids without magnesium replacement.  Loss of magnesium from the bowel by naso-gastric suction.  Loss of magnesium from nausea and vomiting or severe diarrhea. Any of the inflammatory bowel conditions can cause this.  Abuse of alcohol often leads to low serum magnesium.  An inherited form of magnesium loss happens when the kidneys lose magnesium. This is called familial or primary hypomagnesemia.  Some medications such as diuretics also cause the loss of magnesium. SYMPTOMS  These following problems are worse if the changes in magnesium levels come on suddenly.  Tremor.  Confusion.  Muscle weakness.  Over-sensitive to sights and sounds.  Sensitive reflexes.  Depression.  Muscular fibrillations.  Over-reactivity of the nerves.  Irritability.  Psychosis.  Spasms of the hand muscles.  Tetany (where the muscles go into uncontrollable spasms). DIAGNOSIS  This condition can be diagnosed by blood tests. TREATMENT   In emergency, magnesium can be given intravenously (by vein).  If the condition is less worrisome, it can be corrected by diet. High levels of magnesium are found in green leafy vegetables, peas, beans and nuts among other things. It can also be given through medications by mouth.  If it is being caused by medications, changes can be made.  If  alcohol is a problem, help is available if there are difficulties giving it up. Document Released: 09/03/2005 Document Revised: 03/01/2012 Document Reviewed: 07/28/2008 [employer] Patient Information 2014 [employer].        

## 2014-01-13 ENCOUNTER — Other Ambulatory Visit: Payer: Self-pay

## 2014-01-13 ENCOUNTER — Other Ambulatory Visit: Payer: Self-pay | Admitting: *Deleted

## 2014-01-13 DIAGNOSIS — T380X5A Adverse effect of glucocorticoids and synthetic analogues, initial encounter: Secondary | ICD-10-CM

## 2014-01-13 DIAGNOSIS — C9110 Chronic lymphocytic leukemia of B-cell type not having achieved remission: Secondary | ICD-10-CM

## 2014-01-13 DIAGNOSIS — E099 Drug or chemical induced diabetes mellitus without complications: Secondary | ICD-10-CM

## 2014-01-13 DIAGNOSIS — E669 Obesity, unspecified: Secondary | ICD-10-CM

## 2014-01-13 LAB — IGG, IGA, IGM
IGM, SERUM: 5 mg/dL — AB (ref 41–251)
IgA: <6 mg/dL — ABNORMAL LOW (ref 68–379)
IgG (Immunoglobin G), Serum: 411 mg/dL — ABNORMAL LOW (ref 650–1600)

## 2014-01-17 ENCOUNTER — Other Ambulatory Visit: Payer: Self-pay

## 2014-01-17 MED ORDER — ONETOUCH DELICA LANCETS 33G MISC: Status: DC

## 2014-01-18 ENCOUNTER — Ambulatory Visit: Payer: BC Managed Care – PPO

## 2014-01-18 DIAGNOSIS — IMO0001 Reserved for inherently not codable concepts without codable children: Secondary | ICD-10-CM

## 2014-01-18 LAB — CLOSTRIDIUM DIFFICILE BY PCR: CDIFFPCR: NEGATIVE

## 2014-01-19 ENCOUNTER — Telehealth: Payer: Self-pay | Admitting: *Deleted

## 2014-01-19 ENCOUNTER — Ambulatory Visit (HOSPITAL_BASED_OUTPATIENT_CLINIC_OR_DEPARTMENT_OTHER): Payer: BC Managed Care – PPO

## 2014-01-19 ENCOUNTER — Other Ambulatory Visit: Payer: Self-pay | Admitting: *Deleted

## 2014-01-19 ENCOUNTER — Other Ambulatory Visit (HOSPITAL_BASED_OUTPATIENT_CLINIC_OR_DEPARTMENT_OTHER): Payer: BC Managed Care – PPO

## 2014-01-19 DIAGNOSIS — E669 Obesity, unspecified: Secondary | ICD-10-CM

## 2014-01-19 DIAGNOSIS — R5381 Other malaise: Secondary | ICD-10-CM

## 2014-01-19 DIAGNOSIS — T8609 Other complications of bone marrow transplant: Secondary | ICD-10-CM

## 2014-01-19 DIAGNOSIS — C9110 Chronic lymphocytic leukemia of B-cell type not having achieved remission: Secondary | ICD-10-CM

## 2014-01-19 DIAGNOSIS — E099 Drug or chemical induced diabetes mellitus without complications: Secondary | ICD-10-CM

## 2014-01-19 DIAGNOSIS — D801 Nonfamilial hypogammaglobulinemia: Secondary | ICD-10-CM

## 2014-01-19 DIAGNOSIS — G729 Myopathy, unspecified: Secondary | ICD-10-CM

## 2014-01-19 DIAGNOSIS — D89811 Chronic graft-versus-host disease: Secondary | ICD-10-CM

## 2014-01-19 DIAGNOSIS — E785 Hyperlipidemia, unspecified: Secondary | ICD-10-CM

## 2014-01-19 DIAGNOSIS — T380X5A Adverse effect of glucocorticoids and synthetic analogues, initial encounter: Secondary | ICD-10-CM

## 2014-01-19 DIAGNOSIS — Z9489 Other transplanted organ and tissue status: Secondary | ICD-10-CM

## 2014-01-19 LAB — COMPREHENSIVE METABOLIC PANEL (CC13)
ALT: 42 U/L (ref 0–55)
ANION GAP: 9 meq/L (ref 3–11)
AST: 32 U/L (ref 5–34)
Albumin: 3.6 g/dL (ref 3.5–5.0)
Alkaline Phosphatase: 176 U/L — ABNORMAL HIGH (ref 40–150)
BUN: 37 mg/dL — AB (ref 7.0–26.0)
CALCIUM: 8.6 mg/dL (ref 8.4–10.4)
CHLORIDE: 109 meq/L (ref 98–109)
CO2: 19 meq/L — AB (ref 22–29)
CREATININE: 1 mg/dL (ref 0.7–1.3)
Glucose: 186 mg/dl — ABNORMAL HIGH (ref 70–140)
Potassium: 5 mEq/L (ref 3.5–5.1)
Sodium: 138 mEq/L (ref 136–145)
Total Bilirubin: 0.26 mg/dL (ref 0.20–1.20)
Total Protein: 5.9 g/dL — ABNORMAL LOW (ref 6.4–8.3)

## 2014-01-19 LAB — CBC WITH DIFFERENTIAL/PLATELET
BASO%: 0.1 % (ref 0.0–2.0)
Basophils Absolute: 0 10*3/uL (ref 0.0–0.1)
EOS%: 0.4 % (ref 0.0–7.0)
Eosinophils Absolute: 0 10*3/uL (ref 0.0–0.5)
HEMATOCRIT: 36.7 % — AB (ref 38.4–49.9)
HEMOGLOBIN: 11.9 g/dL — AB (ref 13.0–17.1)
LYMPH%: 29.5 % (ref 14.0–49.0)
MCH: 30.5 pg (ref 27.2–33.4)
MCHC: 32.4 g/dL (ref 32.0–36.0)
MCV: 94.1 fL (ref 79.3–98.0)
MONO#: 0.5 10*3/uL (ref 0.1–0.9)
MONO%: 6 % (ref 0.0–14.0)
NEUT#: 5.1 10*3/uL (ref 1.5–6.5)
NEUT%: 64 % (ref 39.0–75.0)
Platelets: 156 10*3/uL (ref 140–400)
RBC: 3.9 10*6/uL — ABNORMAL LOW (ref 4.20–5.82)
RDW: 16 % — ABNORMAL HIGH (ref 11.0–14.6)
WBC: 8 10*3/uL (ref 4.0–10.3)
lymph#: 2.4 10*3/uL (ref 0.9–3.3)

## 2014-01-19 LAB — LIPID PANEL
CHOLESTEROL: 227 mg/dL — AB (ref 0–200)
HDL: 41 mg/dL (ref >39)
Total CHOL/HDL Ratio: 5.5 Ratio
Triglycerides: 428 mg/dL — ABNORMAL HIGH (ref <150)

## 2014-01-19 LAB — HEMOGLOBIN A1C
Hgb A1c MFr Bld: 6.6 % — ABNORMAL HIGH (ref <5.7)
MEAN PLASMA GLUCOSE: 143 mg/dL — AB (ref <117)

## 2014-01-19 LAB — MAGNESIUM (CC13): Magnesium: 1.8 mg/dl (ref 1.5–2.5)

## 2014-01-19 MED ORDER — HEPARIN SOD (PORK) LOCK FLUSH 100 UNIT/ML IV SOLN
500.0000 [IU] | Freq: Once | INTRAVENOUS | Status: AC
  Administered 2014-01-19: 250 [IU] via INTRAVENOUS
  Filled 2014-01-19: qty 5

## 2014-01-19 MED ORDER — SODIUM CHLORIDE 0.9 % IJ SOLN
10.0000 mL | INTRAMUSCULAR | Status: DC | PRN
  Administered 2014-01-19: 10 mL via INTRAVENOUS
  Filled 2014-01-19: qty 10

## 2014-01-19 MED ORDER — SODIUM CHLORIDE 0.9 % IV SOLN
Freq: Once | INTRAVENOUS | Status: AC
  Administered 2014-01-19: 11:00:00 via INTRAVENOUS
  Filled 2014-01-19: qty 500

## 2014-01-19 NOTE — Patient Instructions (Signed)
Hypomagnesemia Magnesium is a common ion (mineral) in the body which is needed for metabolism. It is about how the body handles food and other chemical reactions necessary for life. Only about 2% of the magnesium in our body is found in the blood. When this is low, it is called hypomagnesemia. The blood will measure only a tiny amount of the magnesium in our body. When it is low in our blood, it does not mean that the whole body supply is low. The normal serum concentration ranges from 1.8-2.5 mEq/L. When the level gets to be less than 1.0 mEq/L, a number of problems begin to happen.  CAUSES   Receiving intravenous fluids without magnesium replacement.  Loss of magnesium from the bowel by naso-gastric suction.  Loss of magnesium from nausea and vomiting or severe diarrhea. Any of the inflammatory bowel conditions can cause this.  Abuse of alcohol often leads to low serum magnesium.  An inherited form of magnesium loss happens when the kidneys lose magnesium. This is called familial or primary hypomagnesemia.  Some medications such as diuretics also cause the loss of magnesium. SYMPTOMS  These following problems are worse if the changes in magnesium levels come on suddenly.  Tremor.  Confusion.  Muscle weakness.  Over-sensitive to sights and sounds.  Sensitive reflexes.  Depression.  Muscular fibrillations.  Over-reactivity of the nerves.  Irritability.  Psychosis.  Spasms of the hand muscles.  Tetany (where the muscles go into uncontrollable spasms). DIAGNOSIS  This condition can be diagnosed by blood tests. TREATMENT   In emergency, magnesium can be given intravenously (by vein).  If the condition is less worrisome, it can be corrected by diet. High levels of magnesium are found in green leafy vegetables, peas, beans and nuts among other things. It can also be given through medications by mouth.  If it is being caused by medications, changes can be made.  If  alcohol is a problem, help is available if there are difficulties giving it up. Document Released: 09/03/2005 Document Revised: 03/01/2012 Document Reviewed: 07/28/2008 [employer] Patient Information 2014 [employer].        

## 2014-01-19 NOTE — Progress Notes (Signed)
Orders faxed to [facility] to start Friday. Orders faxed to [phone].

## 2014-01-19 NOTE — Telephone Encounter (Signed)
Per PA-C request. Called pt to inform him that results were neg for C-diff. I told him he has 2 negatives so far. To keep taking antibiotic as scheduled. I told pt we will need another stool sample in 1 mos. Pt verbalized understanding. No further concerns. Message to be forwarded to [employer], PA-C.

## 2014-01-23 ENCOUNTER — Other Ambulatory Visit: Payer: Self-pay

## 2014-01-23 MED ORDER — GLUCOSE BLOOD VI STRP: ORAL_STRIP | Status: DC

## 2014-01-23 MED ORDER — ONETOUCH DELICA LANCETS 33G MISC: Status: DC

## 2014-01-24 ENCOUNTER — Other Ambulatory Visit: Payer: Self-pay | Admitting: *Deleted

## 2014-01-24 ENCOUNTER — Telehealth: Payer: Self-pay | Admitting: *Deleted

## 2014-01-24 MED ORDER — VANCOMYCIN HCL 250 MG PO CAPS: 250.0000 mg | ORAL_CAPSULE | Freq: Four times a day (QID) | ORAL | Status: DC

## 2014-01-24 MED ORDER — VANCOMYCIN HCL 125 MG PO CAPS: 125.0000 mg | ORAL_CAPSULE | Freq: Four times a day (QID) | ORAL | Status: DC

## 2014-01-24 NOTE — Telephone Encounter (Signed)
Called pt to inform him of lipid panel results and have mailed him a copy of these results. Pt verbalized understanding. No further concerns. Message to be forwarded to [employer], PA-C.

## 2014-01-24 NOTE — Telephone Encounter (Signed)
Per call from [place] at [facility] states vancomycin elixir is on back order. Vancomycin capsules can be substituted at same dose as well as same expense.  Per MD new order given for vancomycin capsules with dispense of 1 month supply vs 2 weeks ( 2 weeks given per shelf life of compounded elixir).

## 2014-01-25 ENCOUNTER — Other Ambulatory Visit: Payer: Self-pay | Admitting: Oncology

## 2014-01-25 ENCOUNTER — Other Ambulatory Visit: Payer: Self-pay | Admitting: Physician Assistant

## 2014-01-26 ENCOUNTER — Other Ambulatory Visit (HOSPITAL_BASED_OUTPATIENT_CLINIC_OR_DEPARTMENT_OTHER): Payer: BC Managed Care – PPO

## 2014-01-26 ENCOUNTER — Ambulatory Visit (HOSPITAL_BASED_OUTPATIENT_CLINIC_OR_DEPARTMENT_OTHER): Payer: BC Managed Care – PPO

## 2014-01-26 DIAGNOSIS — T8609 Other complications of bone marrow transplant: Secondary | ICD-10-CM

## 2014-01-26 DIAGNOSIS — D89811 Chronic graft-versus-host disease: Secondary | ICD-10-CM

## 2014-01-26 DIAGNOSIS — D899 Disorder involving the immune mechanism, unspecified: Secondary | ICD-10-CM

## 2014-01-26 DIAGNOSIS — D849 Immunodeficiency, unspecified: Secondary | ICD-10-CM

## 2014-01-26 DIAGNOSIS — C9110 Chronic lymphocytic leukemia of B-cell type not having achieved remission: Secondary | ICD-10-CM

## 2014-01-26 DIAGNOSIS — Z9489 Other transplanted organ and tissue status: Secondary | ICD-10-CM

## 2014-01-26 LAB — COMPREHENSIVE METABOLIC PANEL (CC13)
ALBUMIN: 3.8 g/dL (ref 3.5–5.0)
ALT: 35 U/L (ref 0–55)
ANION GAP: 9 meq/L (ref 3–11)
AST: 22 U/L (ref 5–34)
Alkaline Phosphatase: 153 U/L — ABNORMAL HIGH (ref 40–150)
BUN: 44.9 mg/dL — ABNORMAL HIGH (ref 7.0–26.0)
CALCIUM: 9 mg/dL (ref 8.4–10.4)
CHLORIDE: 110 meq/L — AB (ref 98–109)
CO2: 20 meq/L — AB (ref 22–29)
CREATININE: 1 mg/dL (ref 0.7–1.3)
Glucose: 112 mg/dl (ref 70–140)
POTASSIUM: 5.2 meq/L — AB (ref 3.5–5.1)
SODIUM: 138 meq/L (ref 136–145)
TOTAL PROTEIN: 6.2 g/dL — AB (ref 6.4–8.3)
Total Bilirubin: 0.33 mg/dL (ref 0.20–1.20)

## 2014-01-26 LAB — CBC WITH DIFFERENTIAL/PLATELET
BASO%: 0.6 % (ref 0.0–2.0)
BASOS ABS: 0 10*3/uL (ref 0.0–0.1)
EOS%: 1.2 % (ref 0.0–7.0)
Eosinophils Absolute: 0 10*3/uL (ref 0.0–0.5)
HEMATOCRIT: 35.9 % — AB (ref 38.4–49.9)
HGB: 11.8 g/dL — ABNORMAL LOW (ref 13.0–17.1)
LYMPH%: 71.1 % — AB (ref 14.0–49.0)
MCH: 30.7 pg (ref 27.2–33.4)
MCHC: 32.9 g/dL (ref 32.0–36.0)
MCV: 93.5 fL (ref 79.3–98.0)
MONO#: 0.6 10*3/uL (ref 0.1–0.9)
MONO%: 19.5 % — ABNORMAL HIGH (ref 0.0–14.0)
NEUT#: 0.3 10*3/uL — CL (ref 1.5–6.5)
NEUT%: 7.6 % — AB (ref 39.0–75.0)
PLATELETS: 190 10*3/uL (ref 140–400)
RBC: 3.84 10*6/uL — ABNORMAL LOW (ref 4.20–5.82)
RDW: 15.7 % — ABNORMAL HIGH (ref 11.0–14.6)
WBC: 3.3 10*3/uL — AB (ref 4.0–10.3)
lymph#: 2.3 10*3/uL (ref 0.9–3.3)
nRBC: 0 % (ref 0–0)

## 2014-01-26 LAB — MAGNESIUM (CC13): Magnesium: 2 mg/dl (ref 1.5–2.5)

## 2014-01-26 MED ORDER — SODIUM CHLORIDE 0.9 % IJ SOLN
10.0000 mL | INTRAMUSCULAR | Status: DC | PRN
  Administered 2014-01-26: 10 mL via INTRAVENOUS
  Filled 2014-01-26: qty 10

## 2014-01-26 MED ORDER — HEPARIN SOD (PORK) LOCK FLUSH 100 UNIT/ML IV SOLN
500.0000 [IU] | Freq: Once | INTRAVENOUS | Status: AC
  Administered 2014-01-26: 250 [IU] via INTRAVENOUS
  Filled 2014-01-26: qty 5

## 2014-01-26 MED ORDER — SODIUM CHLORIDE 0.9 % IV SOLN
Freq: Once | INTRAVENOUS | Status: AC
  Administered 2014-01-26: 11:00:00 via INTRAVENOUS
  Filled 2014-01-26: qty 500

## 2014-01-26 NOTE — Patient Instructions (Signed)
Hypomagnesemia Magnesium is a common ion (mineral) in the body which is needed for metabolism. It is about how the body handles food and other chemical reactions necessary for life. Only about 2% of the magnesium in our body is found in the blood. When this is low, it is called hypomagnesemia. The blood will measure only a tiny amount of the magnesium in our body. When it is low in our blood, it does not mean that the whole body supply is low. The normal serum concentration ranges from 1.8-2.5 mEq/L. When the level gets to be less than 1.0 mEq/L, a number of problems begin to happen.  CAUSES   Receiving intravenous fluids without magnesium replacement.  Loss of magnesium from the bowel by naso-gastric suction.  Loss of magnesium from nausea and vomiting or severe diarrhea. Any of the inflammatory bowel conditions can cause this.  Abuse of alcohol often leads to low serum magnesium.  An inherited form of magnesium loss happens when the kidneys lose magnesium. This is called familial or primary hypomagnesemia.  Some medications such as diuretics also cause the loss of magnesium. SYMPTOMS  These following problems are worse if the changes in magnesium levels come on suddenly.  Tremor.  Confusion.  Muscle weakness.  Over-sensitive to sights and sounds.  Sensitive reflexes.  Depression.  Muscular fibrillations.  Over-reactivity of the nerves.  Irritability.  Psychosis.  Spasms of the hand muscles.  Tetany (where the muscles go into uncontrollable spasms). DIAGNOSIS  This condition can be diagnosed by blood tests. TREATMENT   In emergency, magnesium can be given intravenously (by vein).  If the condition is less worrisome, it can be corrected by diet. High levels of magnesium are found in green leafy vegetables, peas, beans and nuts among other things. It can also be given through medications by mouth.  If it is being caused by medications, changes can be made.  If  alcohol is a problem, help is available if there are difficulties giving it up. Document Released: 09/03/2005 Document Revised: 03/01/2012 Document Reviewed: 07/28/2008 [employer] Patient Information 2014 [employer].        

## 2014-01-27 ENCOUNTER — Other Ambulatory Visit: Payer: Self-pay | Admitting: *Deleted

## 2014-01-27 ENCOUNTER — Other Ambulatory Visit (HOSPITAL_BASED_OUTPATIENT_CLINIC_OR_DEPARTMENT_OTHER): Payer: BC Managed Care – PPO

## 2014-01-27 ENCOUNTER — Ambulatory Visit (HOSPITAL_BASED_OUTPATIENT_CLINIC_OR_DEPARTMENT_OTHER): Payer: BC Managed Care – PPO

## 2014-01-27 ENCOUNTER — Other Ambulatory Visit: Payer: Self-pay | Admitting: Oncology

## 2014-01-27 ENCOUNTER — Telehealth: Payer: Self-pay | Admitting: *Deleted

## 2014-01-27 ENCOUNTER — Ambulatory Visit (HOSPITAL_BASED_OUTPATIENT_CLINIC_OR_DEPARTMENT_OTHER): Payer: BC Managed Care – PPO | Admitting: Oncology

## 2014-01-27 VITALS — BP 117/71 | HR 76 | Temp 98.2°F | Resp 18 | Ht 67.0 in | Wt 191.9 lb

## 2014-01-27 DIAGNOSIS — D044 Carcinoma in situ of skin of scalp and neck: Secondary | ICD-10-CM

## 2014-01-27 DIAGNOSIS — G8929 Other chronic pain: Secondary | ICD-10-CM

## 2014-01-27 DIAGNOSIS — Z9489 Other transplanted organ and tissue status: Secondary | ICD-10-CM

## 2014-01-27 DIAGNOSIS — D849 Immunodeficiency, unspecified: Secondary | ICD-10-CM

## 2014-01-27 DIAGNOSIS — C9110 Chronic lymphocytic leukemia of B-cell type not having achieved remission: Secondary | ICD-10-CM

## 2014-01-27 DIAGNOSIS — R7989 Other specified abnormal findings of blood chemistry: Secondary | ICD-10-CM

## 2014-01-27 DIAGNOSIS — E46 Unspecified protein-calorie malnutrition: Secondary | ICD-10-CM

## 2014-01-27 DIAGNOSIS — I10 Essential (primary) hypertension: Secondary | ICD-10-CM

## 2014-01-27 DIAGNOSIS — T380X5A Adverse effect of glucocorticoids and synthetic analogues, initial encounter: Secondary | ICD-10-CM

## 2014-01-27 DIAGNOSIS — E291 Testicular hypofunction: Secondary | ICD-10-CM

## 2014-01-27 DIAGNOSIS — D801 Nonfamilial hypogammaglobulinemia: Secondary | ICD-10-CM

## 2014-01-27 DIAGNOSIS — T8609 Other complications of bone marrow transplant: Secondary | ICD-10-CM

## 2014-01-27 DIAGNOSIS — E2749 Other adrenocortical insufficiency: Secondary | ICD-10-CM

## 2014-01-27 DIAGNOSIS — D89811 Chronic graft-versus-host disease: Secondary | ICD-10-CM

## 2014-01-27 DIAGNOSIS — E099 Drug or chemical induced diabetes mellitus without complications: Secondary | ICD-10-CM

## 2014-01-27 DIAGNOSIS — R197 Diarrhea, unspecified: Secondary | ICD-10-CM

## 2014-01-27 DIAGNOSIS — M549 Dorsalgia, unspecified: Secondary | ICD-10-CM

## 2014-01-27 DIAGNOSIS — E860 Dehydration: Secondary | ICD-10-CM

## 2014-01-27 DIAGNOSIS — D899 Disorder involving the immune mechanism, unspecified: Secondary | ICD-10-CM

## 2014-01-27 DIAGNOSIS — M818 Other osteoporosis without current pathological fracture: Secondary | ICD-10-CM

## 2014-01-27 DIAGNOSIS — R110 Nausea: Secondary | ICD-10-CM

## 2014-01-27 LAB — CBC WITH DIFFERENTIAL/PLATELET
BASO%: 0.5 % (ref 0.0–2.0)
Basophils Absolute: 0 10*3/uL (ref 0.0–0.1)
EOS%: 1.8 % (ref 0.0–7.0)
Eosinophils Absolute: 0.1 10*3/uL (ref 0.0–0.5)
HCT: 37.7 % — ABNORMAL LOW (ref 38.4–49.9)
HGB: 12.1 g/dL — ABNORMAL LOW (ref 13.0–17.1)
LYMPH%: 71.8 % — ABNORMAL HIGH (ref 14.0–49.0)
MCH: 30.4 pg (ref 27.2–33.4)
MCHC: 32.1 g/dL (ref 32.0–36.0)
MCV: 94.7 fL (ref 79.3–98.0)
MONO#: 0.7 10*3/uL (ref 0.1–0.9)
MONO%: 18.7 % — AB (ref 0.0–14.0)
NEUT#: 0.3 10*3/uL — CL (ref 1.5–6.5)
NEUT%: 7.2 % — ABNORMAL LOW (ref 39.0–75.0)
NRBC: 0 % (ref 0–0)
Platelets: 179 10*3/uL (ref 140–400)
RBC: 3.98 10*6/uL — AB (ref 4.20–5.82)
RDW: 16 % — AB (ref 11.0–14.6)
WBC: 3.8 10*3/uL — ABNORMAL LOW (ref 4.0–10.3)
lymph#: 2.7 10*3/uL (ref 0.9–3.3)

## 2014-01-27 LAB — CLOSTRIDIUM DIFFICILE BY PCR: Toxigenic C. Difficile by PCR: NEGATIVE

## 2014-01-27 LAB — IGG, IGA, IGM
IGM, SERUM: 7 mg/dL — AB (ref 41–251)
IgA: 7 mg/dL — ABNORMAL LOW (ref 68–379)
IgG (Immunoglobin G), Serum: 375 mg/dL — ABNORMAL LOW (ref 650–1600)

## 2014-01-27 MED ORDER — SODIUM CHLORIDE 0.9 % IJ SOLN
10.0000 mL | INTRAMUSCULAR | Status: AC | PRN
  Administered 2014-01-27: 10 mL
  Filled 2014-01-27: qty 10

## 2014-01-27 MED ORDER — HEPARIN SOD (PORK) LOCK FLUSH 100 UNIT/ML IV SOLN
250.0000 [IU] | INTRAVENOUS | Status: AC | PRN
  Administered 2014-01-27: 250 [IU]
  Filled 2014-01-27: qty 5

## 2014-01-27 MED ORDER — SODIUM CHLORIDE 0.9 % IV SOLN
Freq: Once | INTRAVENOUS | Status: AC
  Administered 2014-01-27: 14:00:00 via INTRAVENOUS
  Filled 2014-01-27: qty 500

## 2014-01-27 NOTE — Progress Notes (Signed)
ID: Timothy [name]   DOB: 01-27-1946  MR#: [id number]  NKN#:[id number]  FXT:KWIO,X DOUGLAS, MD SU: OTHER MD: [name]; [name], MD; [name], [name],MD; [name], MD; [facility] in [place], [place]:  [name], RN [phone])  CHIEF COMPLAINT:  CLL, status post allogeneic stem cell transplant   HISTORY OF PRESENT ILLNESS: We have very complete records from Dr. Racheal Patches in [place], and in summary:  The patient was initially diagnosed in August 2000, with a white cell count of 23,600, but normal hemoglobin and platelets, and no significant symptomatology. Over the next several years his white cell count drifted up, and he eventually developed some symptoms of night sweats in particular, leading to treatment with fludarabine, Cytoxan and rituxan for five cycles given between December 2006 and May 2007.  We have CT scans from June 2006, November 2006 and April 2007, and comparing the November 2006 and April 2007 scans, there was near complete response. He had subsequent therapy in [place] as detailed below, but with decreased response, leading to allogeneic stem-cell transplant at the [facility] 02/24/2012.  INTERVAL HISTORY: Kleber returns today for an unscheduled visit accompanied by his wife [name]. They called today to tell us that last night. She had a large watery bowel movements. He then had a small less watery, more for one this morning. He admits that he is missed some vancomycin doses recently. His last to C. difficile determinations were negative however.--In addition, he was found to have a low white count at his last visit here, namely an ANC of 0.3. There was no increase in the lymphocyte count. He is here to discuss those issues.  REVIEW OF SYSTEMS: Suhaan "feels well", despite these new developments. In particular his hips are not hurting anymore. This could be because his prednisone now is at a  lower dose. He is also receiving physical therapy. He's had mild sinus symptoms and a slight sore throat, but no cough, phlegm production, shortness of breath, pleurisy, or fever. He is not aware of any rash. His mouth is fine. He does not feel weak. He does continue to have insomnia, muscle aches which are very intermittent, easy bruising, and of course he is sugars, which are closely followed by Dr. Bryson Ha. A detailed review of systems today was otherwise noncontributorel   PAST MEDICAL HISTORY: Past Medical History  Diagnosis Date  . 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 Diverticular disease   . Hyperlipidemia   . Obesity   . Hypertension   . Hiatal hernia   . CMV (cytomegalovirus) antibody positive     pre-transplant, with seroconversion x2 pst-transplant  . Right bundle branch block     pre-transplant  . CKD (chronic kidney disease) stage 2, GFR 60-89 ml/min   . Pancytopenia   . Steroid-induced diabetes   . Atrial fibrillation     post-transplant  . Myopathy   . 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 Leukemia, chronic lymphoid   . 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 Diarrhea in adult patient 12/05/2012    Due to active GVHD  . CLL (chronic lymphocytic leukemia) 12/05/2012    Dx 07/1999; started Rx 12/06  AlloBMT 3/13  . Rash of face 12/05/2012    Due to GVHD  . Hypomagnesemia 01/26/2013  . Left hip pain 12/01/2013    PAST SURGICAL HISTORY: Past Surgical History  Procedure Laterality Date  . Tonsillectomy and adenoidectomy    . Bone marrow transplant    . Flexible sigmoidoscopy  11/17/2012    Procedure: FLEXIBLE SIGMOIDOSCOPY;  Surgeon: Jeryl Columbia, MD;  Location: [facility];  Service: Endoscopy;  Laterality: N/A;  Dr Watt Climes states will be admitted to rooom 1339 11/16/12  . Esophagogastroduodenoscopy  11/17/2012    Procedure: ESOPHAGOGASTRODUODENOSCOPY  (EGD);  Surgeon: Jeryl Columbia, MD;  Location: [facility];  Service: Endoscopy;  Laterality: N/A;    FAMILY HISTORY Family History  Problem Relation Age of Onset  . Cancer Father    The patient's father died from complications of chronic lymphocytic leukemia at the age of 48.  It had been diagnosed seven years before when he was 61.  The patient is enrolled in a familial chronic lymphocytic leukemia study out of the [employer].  The patient's mother is 11, alive, unfortunately suffering with dementia, and he has a brother, 17, who is otherwise in fair health.   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  His wife of >40 years, [name], is a homemaker.  Their daughter, [name], lives in [place].  She also is a Agricultural engineer.  The patient has an 104 year old grandson and an 75-year-old granddaughter, and that is really the main reason he moved to this area.  He is a Tourist information centre manager.     ADVANCED DIRECTIVES: In place  HEALTH MAINTENANCE: (Updated January 2015) History  Substance Use Topics  . Smoking status: Never Smoker   . Smokeless tobacco: Never Used  . Alcohol Use: No     Colonoscopy: Nov 2013, Dr. Watt Climes  PSA: Not on file  Bone density:  Feb 2014  Lipid panel: Nov  2014, elevated    Allergies  Allergen Reactions  . Benadryl [Diphenhydramine Hcl]     "Restless leg syndrome"    Current Outpatient Prescriptions  Medication Sig Dispense Refill  . acyclovir (ZOVIRAX) 400 MG tablet Take 2 tablets (800 mg total) by mouth 2 (two) times daily.  120 tablet  3  . B-D UF III MINI PEN NEEDLES 31G X 5 MM MISC       . budesonide (ENTOCORT EC) 3 MG 24 hr capsule Take 1 capsule (3 mg total) by mouth 3 (three) times daily.  90 capsule  5  . calcium citrate-vitamin D (CITRACAL+D) 315-200 MG-UNIT per tablet Take 1 tablet by mouth 2 (two) times daily.      .  cholecalciferol (VITAMIN D) 1000 UNITS tablet Take 1,000 Units by mouth every evening.       . cholestyramine (QUESTRAN) 4 G packet Take 1 packet by mouth 3 (three) times daily with meals.      Marland Kitchen diltiazem (CARDIZEM CD) 240 MG 24 hr capsule TAKE 1 CAPSULE BY MOUTH DAILY  30 capsule  3  . ferrous sulfate 325 (65 FE) MG tablet Take 325 mg by mouth daily with breakfast.      . fluconazole (DIFLUCAN) 100 MG tablet Take 100 mg by mouth daily.      . furosemide (LASIX) 20 MG tablet TAKE 1 TABLET BY MOUTH EVERY MORNING  30 tablet  2  . glucose blood (ONE TOUCH ULTRA TEST) test strip Test before meals and at bedtime.  300 each  0  . Heparin Lock Flush (HEPARIN FLUSH, PORCINE,) 100 UNIT/ML injection 300 Units by Intracatheter route as needed (to flush  Hickman port).       Marland Kitchen HYDROcodone-acetaminophen (NORCO) 10-325 MG per tablet Take 1-2 tablets by mouth every 6 (six) hours as needed.  120 tablet  0  . Insulin Aspart Prot & Aspart (70-30) 100 UNIT/ML SUPN Inject 10 Units into the skin daily with breakfast.       . labetalol (NORMODYNE) 200 MG tablet Take 1 tablet (200 mg total) by mouth 2 (two) times daily.  60 tablet  2  . Lidocaine-Hydrocortisone Ace 3-0.5 % KIT Apply 1 application topically as needed (for pain).       Marland Kitchen lisinopril (PRINIVIL,ZESTRIL) 10 MG tablet Take 40 mg by mouth every evening.      . Multiple Vitamin (MULTIVITAMIN WITH MINERALS) TABS tablet Take 1 tablet by mouth daily.      Marland Kitchen omeprazole (PRILOSEC) 20 MG capsule Take 20 mg by mouth daily.      Glory Rosebush DELICA LANCETS 62I MISC Test before meals and at bedtime.  300 each  0  . OxyCODONE (OXYCONTIN) 10 mg T12A 12 hr tablet Take 1 tablet (10 mg total) by mouth every 12 (twelve) hours.  60 tablet  0  . oxyCODONE-acetaminophen (PERCOCET/ROXICET) 5-325 MG per tablet       . predniSONE (DELTASONE) 10 MG tablet Take 5 mg by mouth 2 (two) times daily. 5 mg in AM and 2.5 mg in PM, as of 12/29/2013      . PREVIDENT 5000 PLUS 1.1 % CREA dental  cream       . PROTEIN PO Take 6 g by mouth 3 (three) times daily.      . sertraline (ZOLOFT) 50 MG tablet ALTERNATE TAKING 1 TABLET DAILY AND 2 TABLETS DAILY  90 tablet  3  . Sodium Chloride Flush (NORMAL SALINE FLUSH) 0.9 % SOLN 10 mLs by Intracatheter route as needed (to flush Hickman).       Marland Kitchen sulfamethoxazole-trimethoprim (BACTRIM DS) 800-160 MG per tablet Take 1 tablet by mouth daily.      . tacrolimus (PROGRAF) 0.5 MG capsule TAKE 3 CAPSULES BY MOUTH TWO TIMES DAILY  180 capsule  2  . vancomycin (VANCOCIN) 125 MG capsule Take 1 capsule (125 mg total) by mouth 4 (four) times daily.  120 capsule  6  . zinc sulfate 220 MG capsule Take 220 mg by mouth daily.      . [DISCONTINUED] insulin aspart (NOVOLOG FLEXPEN) 100 UNIT/ML SOPN FlexPen 18units sq qam, 9units sq qpm, or as directed  15 mL  1   No current facility-administered medications for this visit.   Facility-Administered Medications Ordered in Other Visits  Medication Dose Route Frequency Provider Last Rate Last Dose  . 0.9 %  sodium chloride infusion   Intravenous Continuous Amy G Berry, PA-C 500 mL/hr at 03/12/13 0900    . sodium chloride 0.9 % injection 10 mL  10 mL Intravenous PRN Chauncey Cruel, MD   10 mL at 08/11/12 1606    OBJECTIVE: Middle-aged white male  in no acute distress. Filed Vitals:   01/27/14 1230  BP: 117/71  Pulse: 76  Temp: 98.2 F (36.8 C)  Resp: 18   Sclerae unicteric, pupils equal and reactive Oropharynx clear and moist-- no lesions No cervical or supraclavicular adenopathy Lungs no rales or rhonchi, good excursion bilaterally Heart regular rate and rhythm Abd soft, nontender, positive bowel sounds MSK no focal spinal tenderness, no upper extremity lymphedema Neuro: nonfocal, well oriented, appropriate affect Skin: No evidence of rash    RESULTS: (latest)  IgG   789  12/16/2013  Mag   1.7  01/12/2014  Tacrolimus   6.8 01/05/2014  CBC    Component Value Date/Time   WBC 3.8* 01/27/2014  1217   WBC 7.3 08/18/2013 0725   RBC 3.98* 01/27/2014 1217   RBC 3.60* 08/18/2013 0725   RBC 3.82* 03/16/2013 1400   HGB 12.1* 01/27/2014 1217   HGB 11.2* 08/18/2013 0725   HCT 37.7* 01/27/2014 1217   HCT 33.8* 08/18/2013 0725   PLT 179 01/27/2014 1217   PLT 112* 08/18/2013 0725   MCV 94.7 01/27/2014 1217   MCV 93.9 08/18/2013 0725   MCH 30.4 01/27/2014 1217   MCH 31.1 08/18/2013 0725   MCHC 32.1 01/27/2014 1217   MCHC 33.1 08/18/2013 0725   RDW 16.0* 01/27/2014 1217   RDW 14.6 08/18/2013 0725   LYMPHSABS 2.7 01/27/2014 1217   LYMPHSABS 1.4 03/18/2013 0615   MONOABS 0.7 01/27/2014 1217   MONOABS 0.3 03/18/2013 0615   EOSABS 0.1 01/27/2014 1217   EOSABS 0.0 03/18/2013 0615   BASOSABS 0.0 01/27/2014 1217   BASOSABS 0.0 03/18/2013 0615        Chemistry      Component Value Date/Time   NA 138 01/26/2014 1042   NA 131* 06/14/2013 0800   K 5.2* 01/26/2014 1042   K 4.7 06/14/2013 0800   CL 101 06/15/2013 1034   CL 97 06/14/2013 0800   CO2 20* 01/26/2014 1042   CO2 24 06/14/2013 0800   BUN 44.9* 01/26/2014 1042   BUN 38* 06/14/2013 0800   CREATININE 1.0 01/26/2014 1042   CREATININE 0.97 06/14/2013 0800      Component Value Date/Time   CALCIUM 9.0 01/26/2014 1042   CALCIUM 9.1 06/14/2013 0800   ALKPHOS 153* 01/26/2014 1042   ALKPHOS 75 04/18/2013 1218   AST 22 01/26/2014 1042   AST 24 04/18/2013 1218   ALT 35 01/26/2014 1042   ALT 33 04/18/2013 1218   BILITOT 0.33 01/26/2014 1042   BILITOT 0.2* 04/18/2013 1218      STUDIES:  No results found.   ASSESSMENT: 68 y.o. [place] man with a history of well-differentiated lymphocytic lymphoma/ chronic lymphoid leukemia initially diagnosed in 2000, not requiring intervention until 2006; with multiple chromosomal abnormalities.  His treatment history is as follows:  (1) fludarabine/cyclophosphamide/rituximab x5 completed May 2007.   (2) rituximab for 8 doses October 2010, with partial response   (3) Leustatin and ofatumumab weekly x8 July to September 2011 followed by maintenance  ofatumumab maintenance ofatumumab every 2 months, with initial response but rising counts September 2012   (4) status-post unrelated donor stem-cell transplant 02/24/2012 at the [facility]  (a) conditioning regimen consisted of fludarabine + TBI at 200 cGy, followed by rituximab x27;  (b) CMV reactivation x3 (patient CMV positive, donor negative), s/p ganciclovir treatment; 3d reactivation August 2013, s/p gancyclovir, with negative PCR mid-September 2013; last gancyclovir dose 10/06/2012 (c) Chronic GVHD: involving gut and skin, treated with steroids, tacrolimus and MMF.  MMF was eventually d/c'd and tacrolimus currently at a dose of 1.$RemoveB'5mg'[id number]$  BID (d) atrial fibrillation: resolved on brief amiodarone regimen (e) steroid-induced myopathy: improving  (f) hypomagnesemia: improved after d/c gancyclovir (g) hypogammaglobulinemia: s/p IVIG most recently 01/07/2013. (h) history of elevated triglycerides (606 on 07/14/2012)  (i) adrenal insufficiency: on prednisone and budesonide (j) pancytopenia,resolved  (5) restaging studies September 2013  including CT scans, flow cytometry, and bone marrow biopsy, showed no evidence of residual chronic lymphoid leukemia.  (6) recurrent GVHD (skin rash, mouth  changes, severe diarrhea and gastric/duodenal/colonic biopsies 11/17/2012 c/w GVHD grade 2) : now at most grade 1 GI with no significant skin or mouth changes  (7)  malnutrition -- on VITAL supplement in addition to regular diet; on Marinol for anorexia  (8) testosterone deficiency--on patch   (9) deconditioning: ongoing REHAB   (10) mild dehydration: encouraged increased po fluids; receives IVF support w magnesium weekly  (11) steroid-induced osteoporosis with compression fractures: received pamidronate 12/18/2012. Status post kyphoplasty at L3-4 in June 2014. Denosumab started 12/08/2013, to be repeated every 3 months.  (12) chronic back pain and hip pain controlled with OxyContin and  hydrocodone/APAP.  (12) nausea: controlled on current meds  (13)  Positive c.diff, 03/08/2013, on Flagyl 500 mg TID x 20 days, then on oral vanco with Questran, showing improvement; positive when repeated April 2014; Negative x 1 on 11/24/2013  (14) persistently increased BUN  (15)  Hypertension, on labetalol, cardizem, lisinopril, and furosemide;Lisinopril increased by Dr. Brigitte Pulse November 2014   (16) steroid induced hyperglycemia, on sterlix and 70/30 insulin, hemoglobin A1c 5.2 November of 2014;  Followed by Dr. Loanne Drilling  (54) hypogammaglobulinemia-- requiring intermittent supplementation  (18) squamous cell CA in situ removed from left parietal scalp October 2014   PLAN: I am hopeful that carries diarrhea is not due to the C. difficile again. We did have to negative determinations in November and December. We are repeating that test today. He is certainly continuing the vancomycin and urged him not to miss any doses.  Where I don't think that would explain why his white cell count has dropped. I think it to a more likely explanation survey there are worsening graft-versus-host disease, or a viral illness. His hemoglobin is if anything better than before, and his platelet count remains in the normal range. We have been decreasing his oral prednisone, but he has been on the current dose, 5 mg in the morning and 2 point 5 in the evening, for the past 3 weeks, with no evidence of breakthrough. His tacrolimus level was on the low side when most recently checked, but still not out of range.  What we're going to do today is hydrate him and I am setting him up for hydration tomorrow as well. If the diarrhea persists we may consider admission and it is convenient although unpleasant that I am on-call this weekend. If the diarrhea resolves over the next 28 hours then he really has an appointment to see me on February 12 and we will repeat his counts at that time. Otherwise we will consider upping his  prednisone dose.  Charlee has a good understanding of this plan. He is in agreement with that. He knows to call for any other problems that may develop before his next visit here.   Chauncey Cruel, MD      01/27/2014

## 2014-01-27 NOTE — Patient Instructions (Signed)
Hypomagnesemia Magnesium is a common ion (mineral) in the body which is needed for metabolism. It is about how the body handles food and other chemical reactions necessary for life. Only about 2% of the magnesium in our body is found in the blood. When this is low, it is called hypomagnesemia. The blood will measure only a tiny amount of the magnesium in our body. When it is low in our blood, it does not mean that the whole body supply is low. The normal serum concentration ranges from 1.8-2.5 mEq/L. When the level gets to be less than 1.0 mEq/L, a number of problems begin to happen.  CAUSES   Receiving intravenous fluids without magnesium replacement.  Loss of magnesium from the bowel by naso-gastric suction.  Loss of magnesium from nausea and vomiting or severe diarrhea. Any of the inflammatory bowel conditions can cause this.  Abuse of alcohol often leads to low serum magnesium.  An inherited form of magnesium loss happens when the kidneys lose magnesium. This is called familial or primary hypomagnesemia.  Some medications such as diuretics also cause the loss of magnesium. SYMPTOMS  These following problems are worse if the changes in magnesium levels come on suddenly.  Tremor.  Confusion.  Muscle weakness.  Over-sensitive to sights and sounds.  Sensitive reflexes.  Depression.  Muscular fibrillations.  Over-reactivity of the nerves.  Irritability.  Psychosis.  Spasms of the hand muscles.  Tetany (where the muscles go into uncontrollable spasms). DIAGNOSIS  This condition can be diagnosed by blood tests. TREATMENT   In emergency, magnesium can be given intravenously (by vein).  If the condition is less worrisome, it can be corrected by diet. High levels of magnesium are found in green leafy vegetables, peas, beans and nuts among other things. It can also be given through medications by mouth.  If it is being caused by medications, changes can be made.  If  alcohol is a problem, help is available if there are difficulties giving it up. Document Released: 09/03/2005 Document Revised: 03/01/2012 Document Reviewed: 07/28/2008 [employer] Patient Information 2014 [employer].        

## 2014-01-27 NOTE — Telephone Encounter (Signed)
Message left by pt this am stating onset during the night of " terrible bout of diarrhea and then this am have increased sinus congestion and a sore throat ".  Pt left return call number of [phone].  This note will be given to MD directly due to noted neutropenic lab values per 01/26/2014.

## 2014-01-28 ENCOUNTER — Ambulatory Visit (HOSPITAL_BASED_OUTPATIENT_CLINIC_OR_DEPARTMENT_OTHER): Payer: BC Managed Care – PPO

## 2014-01-28 MED ORDER — SODIUM CHLORIDE 0.9 % IJ SOLN
10.0000 mL | INTRAMUSCULAR | Status: DC | PRN
  Administered 2014-01-28: 10 mL via INTRAVENOUS
  Filled 2014-01-28: qty 10

## 2014-01-28 MED ORDER — MAGNESIUM SULFATE 50 % IJ SOLN
Freq: Once | INTRAMUSCULAR | Status: AC
  Administered 2014-01-28: 09:00:00 via INTRAVENOUS

## 2014-01-28 MED ORDER — HEPARIN SOD (PORK) LOCK FLUSH 100 UNIT/ML IV SOLN
500.0000 [IU] | Freq: Once | INTRAVENOUS | Status: AC
  Administered 2014-01-28: 500 [IU] via INTRAVENOUS
  Filled 2014-01-28: qty 5

## 2014-01-30 ENCOUNTER — Other Ambulatory Visit: Payer: Self-pay | Admitting: Physician Assistant

## 2014-02-02 ENCOUNTER — Ambulatory Visit (HOSPITAL_BASED_OUTPATIENT_CLINIC_OR_DEPARTMENT_OTHER): Payer: BC Managed Care – PPO

## 2014-02-02 ENCOUNTER — Ambulatory Visit (HOSPITAL_BASED_OUTPATIENT_CLINIC_OR_DEPARTMENT_OTHER): Payer: BC Managed Care – PPO | Admitting: Oncology

## 2014-02-02 ENCOUNTER — Other Ambulatory Visit (HOSPITAL_BASED_OUTPATIENT_CLINIC_OR_DEPARTMENT_OTHER): Payer: BC Managed Care – PPO

## 2014-02-02 ENCOUNTER — Telehealth: Payer: Self-pay | Admitting: *Deleted

## 2014-02-02 ENCOUNTER — Telehealth: Payer: Self-pay | Admitting: Physician Assistant

## 2014-02-02 VITALS — BP 139/79 | HR 78 | Temp 97.7°F | Resp 18 | Ht 67.0 in | Wt 195.5 lb

## 2014-02-02 DIAGNOSIS — C9110 Chronic lymphocytic leukemia of B-cell type not having achieved remission: Secondary | ICD-10-CM

## 2014-02-02 DIAGNOSIS — T8609 Other complications of bone marrow transplant: Secondary | ICD-10-CM

## 2014-02-02 DIAGNOSIS — M549 Dorsalgia, unspecified: Secondary | ICD-10-CM

## 2014-02-02 DIAGNOSIS — Z9489 Other transplanted organ and tissue status: Secondary | ICD-10-CM

## 2014-02-02 DIAGNOSIS — I4891 Unspecified atrial fibrillation: Secondary | ICD-10-CM

## 2014-02-02 DIAGNOSIS — T380X5A Adverse effect of glucocorticoids and synthetic analogues, initial encounter: Secondary | ICD-10-CM

## 2014-02-02 DIAGNOSIS — G8929 Other chronic pain: Secondary | ICD-10-CM

## 2014-02-02 DIAGNOSIS — D89811 Chronic graft-versus-host disease: Secondary | ICD-10-CM

## 2014-02-02 DIAGNOSIS — D899 Disorder involving the immune mechanism, unspecified: Secondary | ICD-10-CM

## 2014-02-02 DIAGNOSIS — E860 Dehydration: Secondary | ICD-10-CM

## 2014-02-02 DIAGNOSIS — D801 Nonfamilial hypogammaglobulinemia: Secondary | ICD-10-CM

## 2014-02-02 DIAGNOSIS — R110 Nausea: Secondary | ICD-10-CM

## 2014-02-02 DIAGNOSIS — E099 Drug or chemical induced diabetes mellitus without complications: Secondary | ICD-10-CM

## 2014-02-02 DIAGNOSIS — D849 Immunodeficiency, unspecified: Secondary | ICD-10-CM

## 2014-02-02 DIAGNOSIS — M25559 Pain in unspecified hip: Secondary | ICD-10-CM

## 2014-02-02 LAB — CBC WITH DIFFERENTIAL/PLATELET
BASO%: 0.3 % (ref 0.0–2.0)
BASOS ABS: 0 10*3/uL (ref 0.0–0.1)
EOS%: 0.5 % (ref 0.0–7.0)
Eosinophils Absolute: 0 10*3/uL (ref 0.0–0.5)
HEMATOCRIT: 37.2 % — AB (ref 38.4–49.9)
HGB: 12.2 g/dL — ABNORMAL LOW (ref 13.0–17.1)
LYMPH%: 32.3 % (ref 14.0–49.0)
MCH: 30.9 pg (ref 27.2–33.4)
MCHC: 32.8 g/dL (ref 32.0–36.0)
MCV: 94.2 fL (ref 79.3–98.0)
MONO#: 0.8 10*3/uL (ref 0.1–0.9)
MONO%: 10.4 % (ref 0.0–14.0)
NEUT#: 4.4 10*3/uL (ref 1.5–6.5)
NEUT%: 56.5 % (ref 39.0–75.0)
PLATELETS: 187 10*3/uL (ref 140–400)
RBC: 3.95 10*6/uL — ABNORMAL LOW (ref 4.20–5.82)
RDW: 15.8 % — ABNORMAL HIGH (ref 11.0–14.6)
WBC: 7.8 10*3/uL (ref 4.0–10.3)
lymph#: 2.5 10*3/uL (ref 0.9–3.3)
nRBC: 0 % (ref 0–0)

## 2014-02-02 LAB — COMPREHENSIVE METABOLIC PANEL (CC13)
ALBUMIN: 3.8 g/dL (ref 3.5–5.0)
ALT: 40 U/L (ref 0–55)
ANION GAP: 9 meq/L (ref 3–11)
AST: 26 U/L (ref 5–34)
Alkaline Phosphatase: 136 U/L (ref 40–150)
BUN: 46.4 mg/dL — ABNORMAL HIGH (ref 7.0–26.0)
CO2: 20 mEq/L — ABNORMAL LOW (ref 22–29)
Calcium: 9 mg/dL (ref 8.4–10.4)
Chloride: 110 mEq/L — ABNORMAL HIGH (ref 98–109)
Creatinine: 1 mg/dL (ref 0.7–1.3)
GLUCOSE: 131 mg/dL (ref 70–140)
POTASSIUM: 5.1 meq/L (ref 3.5–5.1)
SODIUM: 139 meq/L (ref 136–145)
TOTAL PROTEIN: 6 g/dL — AB (ref 6.4–8.3)
Total Bilirubin: 0.29 mg/dL (ref 0.20–1.20)

## 2014-02-02 LAB — MAGNESIUM (CC13): Magnesium: 1.8 mg/dl (ref 1.5–2.5)

## 2014-02-02 MED ORDER — SODIUM CHLORIDE 0.9 % IV SOLN
Freq: Once | INTRAVENOUS | Status: AC
  Administered 2014-02-02: 13:00:00 via INTRAVENOUS
  Filled 2014-02-02: qty 500

## 2014-02-02 MED ORDER — TACROLIMUS 0.5 MG PO CAPS
ORAL_CAPSULE | ORAL | Status: DC
Start: 2014-02-02 — End: 2014-04-13

## 2014-02-02 NOTE — Telephone Encounter (Signed)
Per staff message and POF I have scheduled appts.  JMW  

## 2014-02-02 NOTE — Patient Instructions (Signed)
[place]        BELOW ARE SYMPTOMS THAT SHOULD BE REPORTED IMMEDIATELY:  *FEVER GREATER THAN 100.5 F  *CHILLS WITH OR WITHOUT FEVER  NAUSEA AND VOMITING THAT IS NOT CONTROLLED WITH YOUR NAUSEA MEDICATION  *UNUSUAL SHORTNESS OF BREATH  *UNUSUAL BRUISING OR BLEEDING  TENDERNESS IN MOUTH AND THROAT WITH OR WITHOUT PRESENCE OF ULCERS  *URINARY PROBLEMS  *BOWEL PROBLEMS  UNUSUAL RASH Items with * indicate a potential emergency and should be followed up as soon as possible.  Feel free to call the clinic you have any questions or concerns. The clinic phone number is [phone].

## 2014-02-02 NOTE — Telephone Encounter (Signed)
, °

## 2014-02-02 NOTE — Progress Notes (Signed)
ID: Timothy [name]   DOB: 1946/10/11  MR#: [id number]  IRW#:[id number]  PYP:PJKD,T DOUGLAS, MD SU: OTHER MD: [name]; [name], MD; [name], [name],MD; [name], MD; [facility] in [place], [place]:  [name], RN [phone])  CHIEF COMPLAINT:  CLL, status post allogeneic stem cell transplant   HISTORY OF PRESENT ILLNESS: We have very complete records from Dr. Racheal Patches in [place], and in summary:  The patient was initially diagnosed in August 2000, with a white cell count of 23,600, but normal hemoglobin and platelets, and no significant symptomatology. Over the next several years his white cell count drifted up, and he eventually developed some symptoms of night sweats in particular, leading to treatment with fludarabine, Cytoxan and rituxan for five cycles given between December 2006 and May 2007.  We have CT scans from June 2006, November 2006 and April 2007, and comparing the November 2006 and April 2007 scans, there was near complete response. He had subsequent therapy in [place] as detailed below, but with decreased response, leading to allogeneic stem-cell transplant at the [facility] 02/24/2012.  INTERVAL HISTORY: Timothy [name] returns today accompanied by his wife [name] for followup of his chronic lymphoid leukemia status post allogenic transplant. At the last visit he presented with neutropenia and severe diarrhea. We checked for C. difficile, which was again negative. We hydrated him for 2 days. The diarrhea resolved. Repeat CBC today shows his neutropenia also has resolved.  REVIEW OF SYSTEMS: Timothy [name] continues to slowly improve clinically. He is doing physical therapy twice a week and is "making progress" there. He has not had any fevers, bleeding, or rash. There have been no mouth issues. He bruises easily, but his skin looks better than before, he tells me. He has occasional back pain, and of course  he had significant osteoporotic compression fractures due to all the steroids he has received. He has some tremor from the tacrolimus (which incidentally his pharmacy gives him a hard time in dispense setting, despite which she has not missed any doses). His bowel function is back to his normal, which is about 3 bowel movements, lose but formed, daily, with no accidents. A detailed review of systems today was otherwise stable   PAST MEDICAL HISTORY: Past Medical History  Diagnosis Date  . 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 Diverticular disease   . Hyperlipidemia   . Obesity   . Hypertension   . Hiatal hernia   . CMV (cytomegalovirus) antibody positive     pre-transplant, with seroconversion x2 pst-transplant  . Right bundle branch block     pre-transplant  . CKD (chronic kidney disease) stage 2, GFR 60-89 ml/min   . Pancytopenia   . Steroid-induced diabetes   . Atrial fibrillation     post-transplant  . Myopathy   . 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 Leukemia, chronic lymphoid   . 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 Diarrhea in adult patient 12/05/2012    Due to active GVHD  . CLL (chronic lymphocytic leukemia) 12/05/2012    Dx 07/1999; started Rx 12/06  AlloBMT 3/13  . Rash of face 12/05/2012    Due to GVHD  . Hypomagnesemia 01/26/2013  . Left hip pain 12/01/2013    PAST SURGICAL HISTORY: Past Surgical History  Procedure Laterality Date  . Tonsillectomy and adenoidectomy    . Bone marrow transplant    .  Flexible sigmoidoscopy  11/17/2012    Procedure: FLEXIBLE SIGMOIDOSCOPY;  Surgeon: Jeryl Columbia, MD;  Location: [facility];  Service: Endoscopy;  Laterality: N/A;  Dr Watt Climes states will be admitted to rooom 1339 11/16/12  . Esophagogastroduodenoscopy  11/17/2012    Procedure: ESOPHAGOGASTRODUODENOSCOPY (EGD);  Surgeon: Jeryl Columbia, MD;  Location: [facility];  Service: Endoscopy;  Laterality: N/A;    FAMILY HISTORY Family History  Problem Relation Age of Onset  . Cancer Father    The patient's father died from complications of chronic lymphocytic leukemia at the age of 73.  It had been diagnosed seven years before when he was 39.  The patient is enrolled in a familial chronic lymphocytic leukemia study out of the [employer].  The patient's mother is 52, alive, unfortunately suffering with dementia, and he has a brother, 86, who is otherwise in fair health.   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  His wife of >40 years, [name], is a homemaker.  Their daughter, [name], lives in [place].  She also is a Agricultural engineer.  The patient has an 75 year old grandson and an 27-year-old granddaughter, and that is really the main reason he moved to this area.  He is a Tourist information centre manager.     ADVANCED DIRECTIVES: In place  HEALTH MAINTENANCE: (Updated January 2015) History  Substance Use Topics  . Smoking status: Never Smoker   . Smokeless tobacco: Never Used  . Alcohol Use: No     Colonoscopy: Nov 2013, Dr. Watt Climes  PSA: Not on file  Bone density:  Feb 2014  Lipid panel: Nov  2014, elevated    Allergies  Allergen Reactions  . Benadryl [Diphenhydramine Hcl]     "Restless leg syndrome"    Current Outpatient Prescriptions  Medication Sig Dispense Refill  . acyclovir (ZOVIRAX) 400 MG tablet Take 2 tablets (800 mg total) by mouth 2 (two) times daily.  120 tablet  3  . B-D UF III MINI PEN NEEDLES 31G X 5 MM MISC       . budesonide (ENTOCORT EC) 3 MG 24 hr capsule Take 1 capsule (3 mg total) by mouth 3 (three) times daily.  90 capsule  5  . calcium citrate-vitamin D (CITRACAL+D) 315-200 MG-UNIT per tablet Take 1 tablet by mouth 2 (two) times daily.      . cholecalciferol (VITAMIN D) 1000 UNITS tablet Take 1,000  Units by mouth every evening.       . cholestyramine (QUESTRAN) 4 G packet Take 1 packet by mouth 3 (three) times daily with meals.      Marland Kitchen diltiazem (CARDIZEM CD) 240 MG 24 hr capsule TAKE 1 CAPSULE BY MOUTH DAILY  30 capsule  3  . ferrous sulfate 325 (65 FE) MG tablet Take 325 mg by mouth daily with breakfast.      . fluconazole (DIFLUCAN) 100 MG tablet Take 100 mg by mouth daily.      . furosemide (LASIX) 20 MG tablet TAKE 1 TABLET BY MOUTH EVERY MORNING  30 tablet  2  . glucose blood (ONE TOUCH ULTRA TEST) test strip Test before meals and at bedtime.  300 each  0  . Heparin Lock Flush (HEPARIN FLUSH, PORCINE,) 100 UNIT/ML injection 300 Units by Intracatheter route as needed (to flush Hickman port).       Marland Kitchen HYDROcodone-acetaminophen (NORCO) 10-325 MG per tablet Take 1-2 tablets by  mouth every 6 (six) hours as needed.  120 tablet  0  . Insulin Aspart Prot & Aspart (70-30) 100 UNIT/ML SUPN Inject 10 Units into the skin daily with breakfast.       . labetalol (NORMODYNE) 200 MG tablet Take 1 tablet (200 mg total) by mouth 2 (two) times daily.  60 tablet  2  . Lidocaine-Hydrocortisone Ace 3-0.5 % KIT Apply 1 application topically as needed (for pain).       Marland Kitchen lisinopril (PRINIVIL,ZESTRIL) 10 MG tablet Take 40 mg by mouth every evening.      . Multiple Vitamin (MULTIVITAMIN WITH MINERALS) TABS tablet Take 1 tablet by mouth daily.      Marland Kitchen omeprazole (PRILOSEC) 20 MG capsule Take 20 mg by mouth daily.      Glory Rosebush DELICA LANCETS 05L MISC Test before meals and at bedtime.  300 each  0  . OxyCODONE (OXYCONTIN) 10 mg T12A 12 hr tablet Take 1 tablet (10 mg total) by mouth every 12 (twelve) hours.  60 tablet  0  . oxyCODONE-acetaminophen (PERCOCET/ROXICET) 5-325 MG per tablet       . predniSONE (DELTASONE) 10 MG tablet Take 5 mg by mouth 2 (two) times daily. 5 mg in AM and 2.5 mg in PM, as of 12/29/2013      . PREVIDENT 5000 PLUS 1.1 % CREA dental cream       . PROTEIN PO Take 6 g by mouth 3 (three)  times daily.      . sertraline (ZOLOFT) 50 MG tablet ALTERNATE TAKING 1 TABLET DAILY AND 2 TABLETS DAILY  90 tablet  3  . Sodium Chloride Flush (NORMAL SALINE FLUSH) 0.9 % SOLN 10 mLs by Intracatheter route as needed (to flush Hickman).       Marland Kitchen sulfamethoxazole-trimethoprim (BACTRIM DS) 800-160 MG per tablet Take 1 tablet by mouth daily.      . tacrolimus (PROGRAF) 0.5 MG capsule TAKE 3 CAPSULES BY MOUTH TWO TIMES DAILY  180 capsule  2  . vancomycin (VANCOCIN) 125 MG capsule Take 1 capsule (125 mg total) by mouth 4 (four) times daily.  120 capsule  6  . zinc sulfate 220 MG capsule Take 220 mg by mouth daily.      . [DISCONTINUED] insulin aspart (NOVOLOG FLEXPEN) 100 UNIT/ML SOPN FlexPen 18units sq qam, 9units sq qpm, or as directed  15 mL  1   No current facility-administered medications for this visit.   Facility-Administered Medications Ordered in Other Visits  Medication Dose Route Frequency Provider Last Rate Last Dose  . 0.9 %  sodium chloride infusion   Intravenous Continuous Amy G Berry, PA-C 500 mL/hr at 03/12/13 0900    . sodium chloride 0.9 % injection 10 mL  10 mL Intravenous PRN Chauncey Cruel, MD   10 mL at 08/11/12 1606    OBJECTIVE: Middle-aged white male who appears stated age 32 Vitals:   02/02/14 1154  BP: 139/79  Pulse: 78  Temp: 97.7 F (36.5 C)  Resp: 18   Sclerae unicteric, pupils equal and round Oropharynx clear and moist-- no lesions or thrush No cervical or supraclavicular adenopathy Lungs no rales or rhonchi, good excursion bilaterally Heart regular rate and rhythm, no murmur appreciated Abd soft, nontender, positive bowel sounds MSK no focal spinal tenderness, no upper extremity lymphedema Neuro: nonfocal, well oriented, positive affect Skin: Atrophic, with multiple ecchymosis, but not driver scaly, with no rash    RESULTS: (latest) Results for Timothy, [name] (MRN [id number]) as of  02/02/2014 14:19  Ref. Range 12/01/2013 08:59 12/16/2013 11:46  12/29/2013 08:55 01/12/2014 13:49 01/26/2014 10:43  IgG (Immunoglobin G), Serum Latest Range: [phone] mg/dL 310 (L) 789 583 (L) 411 (L) 375 (L)   Tacrolimus Lvl 5.0 - 20.0 ng/mL  (most recent above) 6.8    13.2    7.3    9.6    10.0    8.5    12.8     CBC    Component Value Date/Time   WBC 7.8 02/02/2014 1138   WBC 7.3 08/18/2013 0725   RBC 3.95* 02/02/2014 1138   RBC 3.60* 08/18/2013 0725   RBC 3.82* 03/16/2013 1400   HGB 12.2* 02/02/2014 1138   HGB 11.2* 08/18/2013 0725   HCT 37.2* 02/02/2014 1138   HCT 33.8* 08/18/2013 0725   PLT 187 02/02/2014 1138   PLT 112* 08/18/2013 0725   MCV 94.2 02/02/2014 1138   MCV 93.9 08/18/2013 0725   MCH 30.9 02/02/2014 1138   MCH 31.1 08/18/2013 0725   MCHC 32.8 02/02/2014 1138   MCHC 33.1 08/18/2013 0725   RDW 15.8* 02/02/2014 1138   RDW 14.6 08/18/2013 0725   LYMPHSABS 2.5 02/02/2014 1138   LYMPHSABS 1.4 03/18/2013 0615   MONOABS 0.8 02/02/2014 1138   MONOABS 0.3 03/18/2013 0615   EOSABS 0.0 02/02/2014 1138   EOSABS 0.0 03/18/2013 0615   BASOSABS 0.0 02/02/2014 1138   BASOSABS 0.0 03/18/2013 0615        Chemistry      Component Value Date/Time   NA 138 01/26/2014 1042   NA 131* 06/14/2013 0800   K 5.2* 01/26/2014 1042   K 4.7 06/14/2013 0800   CL 101 06/15/2013 1034   CL 97 06/14/2013 0800   CO2 20* 01/26/2014 1042   CO2 24 06/14/2013 0800   BUN 44.9* 01/26/2014 1042   BUN 38* 06/14/2013 0800   CREATININE 1.0 01/26/2014 1042   CREATININE 0.97 06/14/2013 0800      Component Value Date/Time   CALCIUM 9.0 01/26/2014 1042   CALCIUM 9.1 06/14/2013 0800   ALKPHOS 153* 01/26/2014 1042   ALKPHOS 75 04/18/2013 1218   AST 22 01/26/2014 1042   AST 24 04/18/2013 1218   ALT 35 01/26/2014 1042   ALT 33 04/18/2013 1218   BILITOT 0.33 01/26/2014 1042   BILITOT 0.2* 04/18/2013 1218      STUDIES:  No results found.  ASSESSMENT: 68 y.o. [place] man with a history of well-differentiated lymphocytic lymphoma/ chronic lymphoid leukemia initially diagnosed in 2000, not  requiring intervention until 2006; with multiple chromosomal abnormalities.  His treatment history is as follows:  (1) fludarabine/cyclophosphamide/rituximab x5 completed May 2007.   (2) rituximab for 8 doses October 2010, with partial response   (3) Leustatin and ofatumumab weekly x8 July to September 2011 followed by maintenance ofatumumab maintenance ofatumumab every 2 months, with initial response but rising counts September 2012   (4) status-post unrelated donor stem-cell transplant 02/24/2012 at [facility]  (a) conditioning regimen consisted of fludarabine + TBI at 200 cGy, followed by rituximab x27;  (b) CMV reactivation x3 (patient CMV positive, donor negative), s/p ganciclovir treatment; 3d reactivation August 2013, s/p gancyclovir, with negative PCR mid-September 2013; last gancyclovir dose 10/06/2012 (c) Chronic GVHD: involving gut and skin, treated with steroids, tacrolimus and MMF.  MMF was eventually d/c'd and tacrolimus currently at a dose of 1.76m BID (d) atrial fibrillation: resolved on brief amiodarone regimen (e) steroid-induced myopathy: improving  (f) hypomagnesemia: improved after d/c gancyclovir (g) hypogammaglobulinemia: s/p  IVIG most recently 01/07/2013. (h) history of elevated triglycerides (606 on 07/14/2012)  (i) adrenal insufficiency: on prednisone and budesonide (j) pancytopenia,resolved (k) brief episode of neutropenia (ANC 300) February 2015, accompanied by diarrhea; resolved   (5) restaging studies September 2013  including CT scans, flow cytometry, and bone marrow biopsy, showed no evidence of residual chronic lymphoid leukemia.  (6) recurrent GVHD (skin rash, mouth changes, severe diarrhea and gastric/duodenal/colonic biopsies 11/17/2012 c/w GVHD grade 2) : now at most grade 1 GI with no significant skin or mouth changes  (7)  malnutrition -- on VITAL supplement in addition to regular diet; on Marinol for anorexia  (8) testosterone deficiency--on patch    (9) deconditioning: ongoing REHAB   (10) mild dehydration: encouraged increased po fluids; receives IVF support w magnesium weekly  (11) steroid-induced osteoporosis with compression fractures: received pamidronate 12/18/2012. Status post kyphoplasty at L3-4 in June 2014. Denosumab started 12/08/2013, to be repeated every 3 months.  (12) chronic back pain and hip pain controlled with OxyContin and hydrocodone/APAP.  (12) nausea: controlled on current meds  (13)  Positive c.diff, 03/08/2013, on Flagyl 500 mg TID x 20 days, then on oral vanco with Questran, showing improvement; positive when repeated April 2014; Negative x 3   Results for Timothy, [name] (MRN [id number]) as of 02/02/2014 14:19  Ref. Range 03/08/2013 15:26 03/29/2013 13:45 12/08/2013 11:52 01/18/2014 14:08 01/27/2014 13:31  C difficile by pcr Latest Range: Negative  Positive (AA) Positive (AA) Negative Negative Negative   (14) persistently increased BUN  (15)  Hypertension, on labetalol, cardizem, lisinopril, and furosemide;Lisinopril increased by Dr. Brigitte Pulse November 2014   (16) steroid induced hyperglycemia, on sterlix and 70/30 insulin, hemoglobin A1c 5.2 November of 2014;  Followed by Dr. Loanne Drilling  (56) hypogammaglobulinemia-- requiring intermittent supplementation  (18) squamous cell CA in situ removed from left parietal scalp October 2014   PLAN: [name]'s diarrhea and neutropenia both have resolved, indicating this was a transient viral illness. We did not increase his prednisone, which remains at 5/2.5 daily. He looks about as good as I remember seeing him, and is doing much more for himself than before.  We're going to continue the weekly fluids and magnesium. He will see [name] again in March 5. He will then be going to [facility], where he will have a repeat bone marrow biopsy, and other tests. He will see [name] again on March 19, after he returns from that trip. Likely we will set him up for IVIG shortly  thereafter.  Incidentally, he brought me a type the narrative of a cream he had. I am separately scanning that.   Timothy [name] knows to call for any problems that may develop before his next visit here.  Chauncey Cruel, MD      02/02/2014

## 2014-02-08 ENCOUNTER — Other Ambulatory Visit: Payer: Self-pay | Admitting: Physician Assistant

## 2014-02-08 ENCOUNTER — Telehealth: Payer: Self-pay | Admitting: Physician Assistant

## 2014-02-08 DIAGNOSIS — C9110 Chronic lymphocytic leukemia of B-cell type not having achieved remission: Secondary | ICD-10-CM

## 2014-02-08 DIAGNOSIS — Z9489 Other transplanted organ and tissue status: Secondary | ICD-10-CM

## 2014-02-08 NOTE — Telephone Encounter (Signed)
, °

## 2014-02-09 ENCOUNTER — Other Ambulatory Visit (HOSPITAL_COMMUNITY)
Admission: RE | Admit: 2014-02-09 | Discharge: 2014-02-09 | Disposition: A | Discharge status: Home / Self Care | Payer: BC Managed Care – PPO | Source: Ambulatory Visit | Attending: Oncology | Admitting: Oncology

## 2014-02-09 ENCOUNTER — Other Ambulatory Visit (HOSPITAL_BASED_OUTPATIENT_CLINIC_OR_DEPARTMENT_OTHER): Payer: BC Managed Care – PPO

## 2014-02-09 ENCOUNTER — Ambulatory Visit (HOSPITAL_BASED_OUTPATIENT_CLINIC_OR_DEPARTMENT_OTHER): Payer: BC Managed Care – PPO

## 2014-02-09 DIAGNOSIS — C9110 Chronic lymphocytic leukemia of B-cell type not having achieved remission: Secondary | ICD-10-CM | POA: Diagnosis not present

## 2014-02-09 DIAGNOSIS — Z9489 Other transplanted organ and tissue status: Secondary | ICD-10-CM

## 2014-02-09 DIAGNOSIS — D89811 Chronic graft-versus-host disease: Secondary | ICD-10-CM

## 2014-02-09 DIAGNOSIS — D899 Disorder involving the immune mechanism, unspecified: Secondary | ICD-10-CM

## 2014-02-09 DIAGNOSIS — D849 Immunodeficiency, unspecified: Secondary | ICD-10-CM

## 2014-02-09 DIAGNOSIS — T8609 Other complications of bone marrow transplant: Secondary | ICD-10-CM

## 2014-02-09 LAB — CBC WITH DIFFERENTIAL/PLATELET
BASO%: 0.2 % (ref 0.0–2.0)
Basophils Absolute: 0 10*3/uL (ref 0.0–0.1)
EOS ABS: 0 10*3/uL (ref 0.0–0.5)
EOS%: 0.4 % (ref 0.0–7.0)
HCT: 36.4 % — ABNORMAL LOW (ref 38.4–49.9)
HGB: 11.9 g/dL — ABNORMAL LOW (ref 13.0–17.1)
LYMPH%: 30.4 % (ref 14.0–49.0)
MCH: 30.6 pg (ref 27.2–33.4)
MCHC: 32.7 g/dL (ref 32.0–36.0)
MCV: 93.6 fL (ref 79.3–98.0)
MONO#: 0.7 10*3/uL (ref 0.1–0.9)
MONO%: 7.5 % (ref 0.0–14.0)
NEUT%: 61.5 % (ref 39.0–75.0)
NEUTROS ABS: 5.9 10*3/uL (ref 1.5–6.5)
PLATELETS: 151 10*3/uL (ref 140–400)
RBC: 3.89 10*6/uL — ABNORMAL LOW (ref 4.20–5.82)
RDW: 15.8 % — ABNORMAL HIGH (ref 11.0–14.6)
WBC: 9.6 10*3/uL (ref 4.0–10.3)
lymph#: 2.9 10*3/uL (ref 0.9–3.3)
nRBC: 0 % (ref 0–0)

## 2014-02-09 LAB — COMPREHENSIVE METABOLIC PANEL (CC13)
ALBUMIN: 3.6 g/dL (ref 3.5–5.0)
ALT: 29 U/L (ref 0–55)
ANION GAP: 7 meq/L (ref 3–11)
AST: 20 U/L (ref 5–34)
Alkaline Phosphatase: 88 U/L (ref 40–150)
BUN: 38.9 mg/dL — ABNORMAL HIGH (ref 7.0–26.0)
CALCIUM: 9 mg/dL (ref 8.4–10.4)
CHLORIDE: 115 meq/L — AB (ref 98–109)
CO2: 19 mEq/L — ABNORMAL LOW (ref 22–29)
Creatinine: 0.9 mg/dL (ref 0.7–1.3)
Glucose: 86 mg/dl (ref 70–140)
Potassium: 4.6 mEq/L (ref 3.5–5.1)
Sodium: 141 mEq/L (ref 136–145)
Total Bilirubin: 0.34 mg/dL (ref 0.20–1.20)
Total Protein: 5.7 g/dL — ABNORMAL LOW (ref 6.4–8.3)

## 2014-02-09 LAB — MAGNESIUM (CC13): Magnesium: 1.9 mg/dl (ref 1.5–2.5)

## 2014-02-09 LAB — LACTATE DEHYDROGENASE (CC13): LDH: 191 U/L (ref 125–245)

## 2014-02-09 MED ORDER — SODIUM CHLORIDE 0.9 % IV SOLN
Freq: Once | INTRAVENOUS | Status: AC
  Administered 2014-02-09: 12:00:00 via INTRAVENOUS
  Filled 2014-02-09: qty 500

## 2014-02-09 MED ORDER — SODIUM CHLORIDE 0.9 % IJ SOLN
10.0000 mL | INTRAMUSCULAR | Status: DC | PRN
  Administered 2014-02-09 (×2): 10 mL via INTRAVENOUS
  Filled 2014-02-09: qty 10

## 2014-02-09 MED ORDER — SODIUM CHLORIDE 0.9 % IJ SOLN
10.0000 mL | INTRAMUSCULAR | Status: DC | PRN
  Filled 2014-02-09: qty 10

## 2014-02-09 MED ORDER — HEPARIN SOD (PORK) LOCK FLUSH 100 UNIT/ML IV SOLN
250.0000 [IU] | Freq: Once | INTRAVENOUS | Status: AC
  Administered 2014-02-09: 250 [IU] via INTRAVENOUS
  Filled 2014-02-09: qty 5

## 2014-02-10 LAB — IGG, IGA, IGM
IgA: <6 mg/dL — ABNORMAL LOW (ref 68–379)
IgG (Immunoglobin G), Serum: 320 mg/dL — ABNORMAL LOW (ref 650–1600)
IgM, Serum: <5 mg/dL — ABNORMAL LOW (ref 41–251)

## 2014-02-13 ENCOUNTER — Other Ambulatory Visit: Payer: Self-pay | Admitting: Physician Assistant

## 2014-02-14 ENCOUNTER — Other Ambulatory Visit: Payer: Self-pay | Admitting: Physician Assistant

## 2014-02-14 DIAGNOSIS — C9110 Chronic lymphocytic leukemia of B-cell type not having achieved remission: Secondary | ICD-10-CM

## 2014-02-14 DIAGNOSIS — D89811 Chronic graft-versus-host disease: Secondary | ICD-10-CM

## 2014-02-14 DIAGNOSIS — Z9489 Other transplanted organ and tissue status: Secondary | ICD-10-CM

## 2014-02-15 LAB — FLOW CYTOMETRY

## 2014-02-16 ENCOUNTER — Ambulatory Visit (HOSPITAL_BASED_OUTPATIENT_CLINIC_OR_DEPARTMENT_OTHER): Payer: BC Managed Care – PPO

## 2014-02-16 ENCOUNTER — Other Ambulatory Visit: Payer: Self-pay | Admitting: *Deleted

## 2014-02-16 ENCOUNTER — Other Ambulatory Visit (HOSPITAL_BASED_OUTPATIENT_CLINIC_OR_DEPARTMENT_OTHER): Payer: BC Managed Care – PPO

## 2014-02-16 ENCOUNTER — Telehealth: Payer: Self-pay | Admitting: *Deleted

## 2014-02-16 DIAGNOSIS — C9110 Chronic lymphocytic leukemia of B-cell type not having achieved remission: Secondary | ICD-10-CM

## 2014-02-16 DIAGNOSIS — Z9489 Other transplanted organ and tissue status: Secondary | ICD-10-CM

## 2014-02-16 DIAGNOSIS — D89811 Chronic graft-versus-host disease: Secondary | ICD-10-CM

## 2014-02-16 DIAGNOSIS — T8609 Other complications of bone marrow transplant: Secondary | ICD-10-CM

## 2014-02-16 LAB — CBC WITH DIFFERENTIAL/PLATELET
BASO%: 0.2 % (ref 0.0–2.0)
Basophils Absolute: 0 10*3/uL (ref 0.0–0.1)
EOS%: 0.5 % (ref 0.0–7.0)
Eosinophils Absolute: 0 10*3/uL (ref 0.0–0.5)
HEMATOCRIT: 36.2 % — AB (ref 38.4–49.9)
HGB: 11.8 g/dL — ABNORMAL LOW (ref 13.0–17.1)
LYMPH%: 36.4 % (ref 14.0–49.0)
MCH: 30.6 pg (ref 27.2–33.4)
MCHC: 32.6 g/dL (ref 32.0–36.0)
MCV: 94 fL (ref 79.3–98.0)
MONO#: 0.8 10*3/uL (ref 0.1–0.9)
MONO%: 9.2 % (ref 0.0–14.0)
NEUT#: 4.4 10*3/uL (ref 1.5–6.5)
NEUT%: 53.7 % (ref 39.0–75.0)
Platelets: 162 10*3/uL (ref 140–400)
RBC: 3.85 10*6/uL — ABNORMAL LOW (ref 4.20–5.82)
RDW: 15.9 % — ABNORMAL HIGH (ref 11.0–14.6)
WBC: 8.1 10*3/uL (ref 4.0–10.3)
lymph#: 3 10*3/uL (ref 0.9–3.3)
nRBC: 0 % (ref 0–0)

## 2014-02-16 LAB — COMPREHENSIVE METABOLIC PANEL (CC13)
ALT: 33 U/L (ref 0–55)
ANION GAP: 9 meq/L (ref 3–11)
AST: 22 U/L (ref 5–34)
Albumin: 3.7 g/dL (ref 3.5–5.0)
Alkaline Phosphatase: 88 U/L (ref 40–150)
BILIRUBIN TOTAL: 0.31 mg/dL (ref 0.20–1.20)
BUN: 43.2 mg/dL — AB (ref 7.0–26.0)
CO2: 16 meq/L — AB (ref 22–29)
CREATININE: 1 mg/dL (ref 0.7–1.3)
Calcium: 8.8 mg/dL (ref 8.4–10.4)
Chloride: 115 mEq/L — ABNORMAL HIGH (ref 98–109)
GLUCOSE: 108 mg/dL (ref 70–140)
Potassium: 4.8 mEq/L (ref 3.5–5.1)
Sodium: 140 mEq/L (ref 136–145)
Total Protein: 5.7 g/dL — ABNORMAL LOW (ref 6.4–8.3)

## 2014-02-16 LAB — MAGNESIUM (CC13): Magnesium: 1.9 mg/dl (ref 1.5–2.5)

## 2014-02-16 MED ORDER — SODIUM CHLORIDE 0.9 % IJ SOLN
3.0000 mL | Freq: Once | INTRAMUSCULAR | Status: AC
  Administered 2014-02-16: 3 mL via INTRAVENOUS
  Filled 2014-02-16: qty 10

## 2014-02-16 MED ORDER — SODIUM CHLORIDE 0.9 % IV SOLN
Freq: Once | INTRAVENOUS | Status: AC
  Administered 2014-02-16: 11:00:00 via INTRAVENOUS
  Filled 2014-02-16: qty 500

## 2014-02-16 MED ORDER — HEPARIN SOD (PORK) LOCK FLUSH 100 UNIT/ML IV SOLN
250.0000 [IU] | Freq: Once | INTRAVENOUS | Status: AC
  Administered 2014-02-16: 250 [IU] via INTRAVENOUS
  Filled 2014-02-16: qty 5

## 2014-02-16 MED ORDER — PREDNISONE 10 MG PO TABS: ORAL_TABLET | ORAL | Status: DC

## 2014-02-16 NOTE — Patient Instructions (Signed)
Dehydration, Adult Dehydration is when you lose more fluids from the body than you take in. Vital organs like the kidneys, brain, and heart cannot function without a proper amount of fluids and salt. Any loss of fluids from the body can cause dehydration.  CAUSES   Vomiting.  Diarrhea.  Excessive sweating.  Excessive urine output.  Fever. SYMPTOMS  Mild dehydration  Thirst.  Dry lips.  Slightly dry mouth. Moderate dehydration  Very dry mouth.  Sunken eyes.  Skin does not bounce back quickly when lightly pinched and released.  Dark urine and decreased urine production.  Decreased tear production.  Headache. Severe dehydration  Very dry mouth.  Extreme thirst.  Rapid, weak pulse (more than 100 beats per minute at rest).  Cold hands and feet.  Not able to sweat in spite of heat and temperature.  Rapid breathing.  Blue lips.  Confusion and lethargy.  Difficulty being awakened.  Minimal urine production.  No tears. DIAGNOSIS  Your caregiver will diagnose dehydration based on your symptoms and your exam. Blood and urine tests will help confirm the diagnosis. The diagnostic evaluation should also identify the cause of dehydration. TREATMENT  Treatment of mild or moderate dehydration can often be done at home by increasing the amount of fluids that you drink. It is best to drink small amounts of fluid more often. Drinking too much at one time can make vomiting worse. Refer to the home care instructions below. Severe dehydration needs to be treated at the hospital where you will probably be given intravenous (IV) fluids that contain water and electrolytes. HOME CARE INSTRUCTIONS   Ask your caregiver about specific rehydration instructions.  Drink enough fluids to keep your urine clear or pale yellow.  Drink small amounts frequently if you have nausea and vomiting.  Eat as you normally do.  Avoid:  Foods or drinks high in sugar.  Carbonated  drinks.  Juice.  Extremely hot or cold fluids.  Drinks with caffeine.  Fatty, greasy foods.  Alcohol.  Tobacco.  Overeating.  Gelatin desserts.  Wash your hands well to avoid spreading bacteria and viruses.  Only take over-the-counter or prescription medicines for pain, discomfort, or fever as directed by your caregiver.  Ask your caregiver if you should continue all prescribed and over-the-counter medicines.  Keep all follow-up appointments with your caregiver. SEEK MEDICAL CARE IF:  You have abdominal pain and it increases or stays in one area (localizes).  You have a rash, stiff neck, or severe headache.  You are irritable, sleepy, or difficult to awaken.  You are weak, dizzy, or extremely thirsty. SEEK IMMEDIATE MEDICAL CARE IF:   You are unable to keep fluids down or you get worse despite treatment.  You have frequent episodes of vomiting or diarrhea.  You have blood or green matter (bile) in your vomit.  You have blood in your stool or your stool looks black and tarry.  You have not urinated in 6 to 8 hours, or you have only urinated a small amount of very dark urine.  You have a fever.  You faint. MAKE SURE YOU:   Understand these instructions.  Will watch your condition.  Will get help right away if you are not doing well or get worse. Document Released: 12/08/2005 Document Revised: 03/01/2012 Document Reviewed: 07/28/2011 Exit[employer] Patient Information 2014 [employer].  

## 2014-02-16 NOTE — Telephone Encounter (Signed)
This RN spoke with Altha Harm at [facility] per pt's upcoming visit for 2 year evaluation.  Altha Harm states per review - BMBX not required per their criteria. Christine wanted to verify Dr Jannifer Rodney did not have any concerns that should be addressed by obtaining a BMBX.  This RN was able to directly ask MD who stated " no" - this information was given to Allison.  Of note Altha Harm states pt inquired with her about having his Hickman catheter removed and she wanted to verify if appropriate due to current IV magnesium replacement therapy.  This RN informed Altha Harm that pt may have catheter removed per his preference and iv's can be started peripheally.  This RN then spoke with pt per above including hickman removal and pt's understanding if continued IV magnesium he would have to be stuck peripheally- and if that became an issue we could then discuss other IV access.  Pt verbalized understanding.

## 2014-02-17 LAB — TACROLIMUS LEVEL: Tacrolimus Lvl: 7.3 ng/mL (ref 5.0–20.0)

## 2014-02-20 ENCOUNTER — Telehealth: Payer: Self-pay | Admitting: Oncology

## 2014-02-20 ENCOUNTER — Ambulatory Visit: Payer: Self-pay | Admitting: Endocrinology

## 2014-02-20 NOTE — Telephone Encounter (Signed)
FAXED PT [place]

## 2014-02-21 ENCOUNTER — Encounter (HOSPITAL_COMMUNITY): Payer: Self-pay

## 2014-02-21 ENCOUNTER — Other Ambulatory Visit: Payer: Self-pay

## 2014-02-21 ENCOUNTER — Ambulatory Visit (HOSPITAL_COMMUNITY)
Admission: RE | Admit: 2014-02-21 | Discharge: 2014-02-21 | Disposition: A | Discharge status: Home / Self Care | Payer: BC Managed Care – PPO | Source: Ambulatory Visit | Attending: Physician Assistant | Admitting: Physician Assistant

## 2014-02-21 DIAGNOSIS — K429 Umbilical hernia without obstruction or gangrene: Secondary | ICD-10-CM | POA: Insufficient documentation

## 2014-02-21 DIAGNOSIS — J320 Chronic maxillary sinusitis: Secondary | ICD-10-CM | POA: Insufficient documentation

## 2014-02-21 DIAGNOSIS — M899 Disorder of bone, unspecified: Secondary | ICD-10-CM | POA: Insufficient documentation

## 2014-02-21 DIAGNOSIS — C9110 Chronic lymphocytic leukemia of B-cell type not having achieved remission: Secondary | ICD-10-CM

## 2014-02-21 DIAGNOSIS — Z9481 Bone marrow transplant status: Secondary | ICD-10-CM | POA: Insufficient documentation

## 2014-02-21 DIAGNOSIS — M8448XA Pathological fracture, other site, initial encounter for fracture: Secondary | ICD-10-CM | POA: Insufficient documentation

## 2014-02-21 DIAGNOSIS — Z9489 Other transplanted organ and tissue status: Secondary | ICD-10-CM

## 2014-02-21 DIAGNOSIS — M949 Disorder of cartilage, unspecified: Secondary | ICD-10-CM

## 2014-02-21 MED ORDER — IOHEXOL 300 MG/ML  SOLN
125.0000 mL | Freq: Once | INTRAMUSCULAR | Status: AC | PRN
  Administered 2014-02-21: 125 mL via INTRAVENOUS

## 2014-02-21 MED ORDER — FLUCONAZOLE 100 MG PO TABS
100.0000 mg | ORAL_TABLET | Freq: Every day | ORAL | Status: DC
Start: 2014-02-21 — End: 2014-09-21

## 2014-02-23 ENCOUNTER — Encounter: Payer: Self-pay | Admitting: Physician Assistant

## 2014-02-23 ENCOUNTER — Ambulatory Visit (HOSPITAL_BASED_OUTPATIENT_CLINIC_OR_DEPARTMENT_OTHER): Payer: BC Managed Care – PPO

## 2014-02-23 ENCOUNTER — Telehealth: Payer: Self-pay | Admitting: *Deleted

## 2014-02-23 ENCOUNTER — Other Ambulatory Visit (HOSPITAL_BASED_OUTPATIENT_CLINIC_OR_DEPARTMENT_OTHER): Payer: BC Managed Care – PPO

## 2014-02-23 ENCOUNTER — Ambulatory Visit (HOSPITAL_BASED_OUTPATIENT_CLINIC_OR_DEPARTMENT_OTHER): Payer: BC Managed Care – PPO | Admitting: Physician Assistant

## 2014-02-23 VITALS — BP 139/85 | HR 60 | Temp 98.0°F | Resp 18 | Ht 67.0 in | Wt 199.5 lb

## 2014-02-23 DIAGNOSIS — T8609 Other complications of bone marrow transplant: Secondary | ICD-10-CM

## 2014-02-23 DIAGNOSIS — M549 Dorsalgia, unspecified: Secondary | ICD-10-CM

## 2014-02-23 DIAGNOSIS — E860 Dehydration: Secondary | ICD-10-CM

## 2014-02-23 DIAGNOSIS — M25551 Pain in right hip: Secondary | ICD-10-CM

## 2014-02-23 DIAGNOSIS — D809 Immunodeficiency with predominantly antibody defects, unspecified: Secondary | ICD-10-CM

## 2014-02-23 DIAGNOSIS — D849 Immunodeficiency, unspecified: Secondary | ICD-10-CM

## 2014-02-23 DIAGNOSIS — D899 Disorder involving the immune mechanism, unspecified: Secondary | ICD-10-CM

## 2014-02-23 DIAGNOSIS — Z9489 Other transplanted organ and tissue status: Secondary | ICD-10-CM

## 2014-02-23 DIAGNOSIS — D89811 Chronic graft-versus-host disease: Secondary | ICD-10-CM

## 2014-02-23 DIAGNOSIS — E099 Drug or chemical induced diabetes mellitus without complications: Secondary | ICD-10-CM

## 2014-02-23 DIAGNOSIS — T380X5A Adverse effect of glucocorticoids and synthetic analogues, initial encounter: Secondary | ICD-10-CM

## 2014-02-23 DIAGNOSIS — M25552 Pain in left hip: Secondary | ICD-10-CM

## 2014-02-23 DIAGNOSIS — I10 Essential (primary) hypertension: Secondary | ICD-10-CM

## 2014-02-23 DIAGNOSIS — D649 Anemia, unspecified: Secondary | ICD-10-CM

## 2014-02-23 DIAGNOSIS — R894 Abnormal immunological findings in specimens from other organs, systems and tissues: Secondary | ICD-10-CM

## 2014-02-23 DIAGNOSIS — G252 Other specified forms of tremor: Secondary | ICD-10-CM

## 2014-02-23 DIAGNOSIS — C9110 Chronic lymphocytic leukemia of B-cell type not having achieved remission: Secondary | ICD-10-CM

## 2014-02-23 DIAGNOSIS — G8929 Other chronic pain: Secondary | ICD-10-CM

## 2014-02-23 DIAGNOSIS — R768 Other specified abnormal immunological findings in serum: Secondary | ICD-10-CM

## 2014-02-23 LAB — CBC WITH DIFFERENTIAL/PLATELET
BASO%: 0.7 % (ref 0.0–2.0)
Basophils Absolute: 0.1 10*3/uL (ref 0.0–0.1)
EOS%: 0.4 % (ref 0.0–7.0)
Eosinophils Absolute: 0 10*3/uL (ref 0.0–0.5)
HCT: 36.4 % — ABNORMAL LOW (ref 38.4–49.9)
HGB: 11.8 g/dL — ABNORMAL LOW (ref 13.0–17.1)
LYMPH#: 2.3 10*3/uL (ref 0.9–3.3)
LYMPH%: 33.5 % (ref 14.0–49.0)
MCH: 30.7 pg (ref 27.2–33.4)
MCHC: 32.4 g/dL (ref 32.0–36.0)
MCV: 94.8 fL (ref 79.3–98.0)
MONO#: 0.7 10*3/uL (ref 0.1–0.9)
MONO%: 9.5 % (ref 0.0–14.0)
NEUT#: 3.8 10*3/uL (ref 1.5–6.5)
NEUT%: 55.9 % (ref 39.0–75.0)
Platelets: 176 10*3/uL (ref 140–400)
RBC: 3.84 10*6/uL — AB (ref 4.20–5.82)
RDW: 16 % — ABNORMAL HIGH (ref 11.0–14.6)
WBC: 6.8 10*3/uL (ref 4.0–10.3)

## 2014-02-23 LAB — COMPREHENSIVE METABOLIC PANEL (CC13)
ALT: 35 U/L (ref 0–55)
AST: 24 U/L (ref 5–34)
Albumin: 3.9 g/dL (ref 3.5–5.0)
Alkaline Phosphatase: 81 U/L (ref 40–150)
Anion Gap: 8 mEq/L (ref 3–11)
BILIRUBIN TOTAL: 0.27 mg/dL (ref 0.20–1.20)
BUN: 49.3 mg/dL — ABNORMAL HIGH (ref 7.0–26.0)
CALCIUM: 9.1 mg/dL (ref 8.4–10.4)
CHLORIDE: 116 meq/L — AB (ref 98–109)
CO2: 18 mEq/L — ABNORMAL LOW (ref 22–29)
Creatinine: 1.2 mg/dL (ref 0.7–1.3)
Glucose: 146 mg/dl — ABNORMAL HIGH (ref 70–140)
Potassium: 5.2 mEq/L — ABNORMAL HIGH (ref 3.5–5.1)
SODIUM: 143 meq/L (ref 136–145)
Total Protein: 5.9 g/dL — ABNORMAL LOW (ref 6.4–8.3)

## 2014-02-23 LAB — MAGNESIUM (CC13): Magnesium: 2.1 mg/dl (ref 1.5–2.5)

## 2014-02-23 MED ORDER — SODIUM CHLORIDE 0.9 % IV SOLN
Freq: Once | INTRAVENOUS | Status: AC
  Administered 2014-02-23: 11:00:00 via INTRAVENOUS
  Filled 2014-02-23: qty 500

## 2014-02-23 NOTE — Patient Instructions (Signed)
Hypomagnesemia Magnesium is a common ion (mineral) in the body which is needed for metabolism. It is about how the body handles food and other chemical reactions necessary for life. Only about 2% of the magnesium in our body is found in the blood. When this is low, it is called hypomagnesemia. The blood will measure only a tiny amount of the magnesium in our body. When it is low in our blood, it does not mean that the whole body supply is low. The normal serum concentration ranges from 1.8-2.5 mEq/L. When the level gets to be less than 1.0 mEq/L, a number of problems begin to happen.  CAUSES   Receiving intravenous fluids without magnesium replacement.  Loss of magnesium from the bowel by naso-gastric suction.  Loss of magnesium from nausea and vomiting or severe diarrhea. Any of the inflammatory bowel conditions can cause this.  Abuse of alcohol often leads to low serum magnesium.  An inherited form of magnesium loss happens when the kidneys lose magnesium. This is called familial or primary hypomagnesemia.  Some medications such as diuretics also cause the loss of magnesium. SYMPTOMS  These following problems are worse if the changes in magnesium levels come on suddenly.  Tremor.  Confusion.  Muscle weakness.  Over-sensitive to sights and sounds.  Sensitive reflexes.  Depression.  Muscular fibrillations.  Over-reactivity of the nerves.  Irritability.  Psychosis.  Spasms of the hand muscles.  Tetany (where the muscles go into uncontrollable spasms). DIAGNOSIS  This condition can be diagnosed by blood tests. TREATMENT   In emergency, magnesium can be given intravenously (by vein).  If the condition is less worrisome, it can be corrected by diet. High levels of magnesium are found in green leafy vegetables, peas, beans and nuts among other things. It can also be given through medications by mouth.  If it is being caused by medications, changes can be made.  If  alcohol is a problem, help is available if there are difficulties giving it up. Document Released: 09/03/2005 Document Revised: 03/01/2012 Document Reviewed: 07/28/2008 [employer] Patient Information 2014 [employer].        

## 2014-02-23 NOTE — Telephone Encounter (Signed)
Per staff message and POF I have scheduled appts.  JMW  

## 2014-02-23 NOTE — Telephone Encounter (Signed)
appts made and printed. Pt is aware that tx will be added. i emailed MW to add the tx...td 

## 2014-02-23 NOTE — Progress Notes (Addendum)
ID: Timothy [name]   DOB: 1946-11-30  MR#: [id number]  XMI#:[id number]  MGN:OIBB, Timothy Saxon, MD SU: OTHER MD: Timothy [name]; Timothy Shin, MD; Timothy [name], Timothy Hatcher,MD; Timothy Rosenthal, MD; [facility] in [place], [place]:  Timothy Duval, RN [phone])  CHIEF COMPLAINT:  CLL, status post allogeneic stem cell transplant   HISTORY OF PRESENT ILLNESS: We have very complete records from Dr. Racheal Patches in [place], and in summary:  Timothy [name] was initially diagnosed in August 2000, with a white cell count of 23,600, but normal hemoglobin and platelets, and no significant symptomatology. Over Timothy next several years his white cell count drifted up, and he eventually developed some symptoms of night sweats in particular, leading to treatment with fludarabine, Cytoxan and rituxan for five cycles given between December 2006 and May 2007.  We have CT scans from June 2006, November 2006 and April 2007, and comparing Timothy November 2006 and April 2007 scans, there was near complete response. He had subsequent therapy in [place] as detailed below, but with decreased response, leading to allogeneic stem-cell transplant at Timothy [facility] 02/24/2012.  Subsequent history is as detailed below.  INTERVAL HISTORY: Timothy [name] returns today accompanied by his wife Timothy [name] for followup of his chronic lymphoid leukemia status post allogenic transplant. Timothy [name] is feeling very well today, and in fact has very few complaints. He is getting ready to leave next week for his routine followup visit in [place].  His white counts have normalized. His bowels have normalized as well, and he currently denies any problems with diarrhea. I will also note that a see to facilitate toxin obtained 01/27/2014 was negative. This was his third negative test, Timothy first in December, Timothy second in January, and Timothy third in February.   Timothy office in  has requested some updated  studies on [place], and subsequently we recently obtain CTs of Timothy chest, abdomen, pelvis, and neck. All these were generally unremarkable they did show new fractures of Timothy anterior second and third ribs bilaterally, and new insufficiency fractures of both sacral alae.  Otherwise, he has some healing fractures, prominent stool, a known umbilical hernia, chronic sinusitis, stable upper normal splenic size, and a stable low-density right iliac bone lesion which was nonspecific. All of this is detailed below. Fortunately there was no evidence of adenopathy observed.  Timothy [name] does have some hip pain and back pain, both of which are chronic, but these have not worsened. He denies any known injuries and has had no recent falls. We do know that he has a history of steroid-induced osteoporosis, and he received his first Q. three-month injection of denosumab in December.  REVIEW OF SYSTEMS: Timothy [name] otherwise denies any fevers or chills. He denies any rashes or skin changes, although he does bruise easily. He has had no abnormal bleeding. His energy level is continuing to improve slowly. His appetite is good, and he denies any nausea or recent change in bowel or bladder habits. He continues to be followed by his primary care physician for diabetes. He's had no increased cough, increased shortness of breath, chest pain, palpitations or increased peripheral swelling. Timothy [name] also denies any abnormal headaches and has had no dizziness or change in vision. He denies any new pain other than Timothy back pain noted above.  A detailed review of systems is otherwise stable and noncontributory.    PAST MEDICAL HISTORY: Past Medical History  Diagnosis Date  . 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 Diverticular  disease   . Hyperlipidemia   . Obesity   . Hypertension   . Hiatal hernia   . CMV (cytomegalovirus) antibody positive     pre-transplant, with seroconversion x2 pst-transplant  . Right  bundle branch block     pre-transplant  . CKD (chronic kidney disease) stage 2, GFR 60-89 ml/min   . Pancytopenia   . Steroid-induced diabetes   . Atrial fibrillation     post-transplant  . Myopathy   . 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 Leukemia, chronic lymphoid   . 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 Diarrhea in adult [name] 12/05/2012    Due to active GVHD  . CLL (chronic lymphocytic leukemia) 12/05/2012    Dx 07/1999; started Rx 12/06  AlloBMT 3/13  . Rash of face 12/05/2012    Due to GVHD  . Hypomagnesemia 01/26/2013  . Left hip pain 12/01/2013    PAST SURGICAL HISTORY: Past Surgical History  Procedure Laterality Date  . Tonsillectomy and adenoidectomy    . Bone marrow transplant    . Flexible sigmoidoscopy  11/17/2012    Procedure: FLEXIBLE SIGMOIDOSCOPY;  Surgeon: Jeryl Columbia, MD;  Location: [facility] END[facility];  Service: Endoscopy;  Laterality: N/A;  Dr Watt Climes states will be admitted to rooom 1339 11/16/12  . Esophagogastroduodenoscopy  11/17/2012    Procedure: ESOPHAGOGASTRODUODENOSCOPY (EGD);  Surgeon: Jeryl Columbia, MD;  Location: [facility];  Service: Endoscopy;  Laterality: N/A;    FAMILY HISTORY Family History  Problem Relation Age of Onset  . Cancer Father    Timothy [name]'s father died from complications of chronic lymphocytic leukemia at Timothy age of 25.  It had been diagnosed seven years before when he was 78.  Timothy [name] is enrolled in a familial chronic lymphocytic leukemia study out of Timothy [employer].  Timothy [name]'s mother is 66, alive, unfortunately suffering with dementia, and he has a brother, 6, who is otherwise in fair health.   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  His wife of >40 years, Timothy [name], is a homemaker.   Their daughter, [name], lives in [place].  She also is a Agricultural engineer.  Timothy [name] has an 77 year old grandson and an 9-year-old granddaughter, and that is really Timothy main reason he moved to this area.  He is a Tourist information centre manager.     ADVANCED DIRECTIVES: In place  HEALTH MAINTENANCE: (Updated 02/23/2014) History  Substance Use Topics  . Smoking status: Never Smoker   . Smokeless tobacco: Never Used  . Alcohol Use: No     Colonoscopy: Nov 2013, Dr. Watt Climes  PSA: Not on file  Bone density:  Feb 2014;  [name] also has known insufficiency and pathologic fractures in addition to his long-standing history of steroid use.  Lipid panel: Jan 2015, elevated    Allergies  Allergen Reactions  . Benadryl [Diphenhydramine Hcl]     "Restless leg syndrome"    Current Outpatient Prescriptions  Medication Sig Dispense Refill  . acyclovir (ZOVIRAX) 400 MG tablet Take 2 tablets (800 mg total) by mouth 2 (two) times daily.  120 tablet  3  . B-D UF III MINI PEN NEEDLES 31G X 5 MM MISC       . budesonide (ENTOCORT EC) 3 MG 24 hr capsule TAKE 1 CAPSULE BY MOUTH 3  TIMES DAILY  90 capsule  1  . calcium citrate-vitamin D (CITRACAL+D) 315-200 MG-UNIT per tablet Take 1 tablet by mouth 2 (two) times daily.      . cholecalciferol (VITAMIN D) 1000 UNITS tablet Take 1,000 Units by mouth every evening.       . cholestyramine (QUESTRAN) 4 G packet Take 1 packet by mouth 3 (three) times daily with meals.      Marland Kitchen diltiazem (CARDIZEM CD) 240 MG 24 hr capsule TAKE 1 CAPSULE BY MOUTH DAILY  30 capsule  3  . ferrous sulfate 325 (65 FE) MG tablet Take 325 mg by mouth daily with breakfast.      . fluconazole (DIFLUCAN) 100 MG tablet Take 1 tablet (100 mg total) by mouth daily.  30 tablet  6  . furosemide (LASIX) 20 MG tablet TAKE 1 TABLET BY MOUTH EVERY MORNING  30 tablet  2  . glucose blood (ONE TOUCH ULTRA TEST) test strip Test before meals and at bedtime.  300 each  0  . Heparin Lock Flush (HEPARIN FLUSH, PORCINE,) 100  UNIT/ML injection 300 Units by Intracatheter route as needed (to flush [place]man port).       Marland Kitchen HYDROcodone-acetaminophen (NORCO) 10-325 MG per tablet Take 1-2 tablets by mouth every 6 (six) hours as needed.  120 tablet  0  . Insulin Aspart Prot & Aspart (70-30) 100 UNIT/ML SUPN Inject 10 Units into Timothy skin daily with breakfast.       . labetalol (NORMODYNE) 200 MG tablet Take 200 mg by mouth. Take 2 tablets by mouth 2 times daily      . Lidocaine-Hydrocortisone Ace 3-0.5 % KIT Apply 1 application topically as needed (for pain).       Marland Kitchen lisinopril (PRINIVIL,ZESTRIL) 10 MG tablet Take 40 mg by mouth every evening.      . Multiple Vitamin (MULTIVITAMIN WITH MINERALS) TABS tablet Take 1 tablet by mouth daily.      Marland Kitchen omeprazole (PRILOSEC) 20 MG capsule Take 20 mg by mouth daily.      Glory Rosebush DELICA LANCETS 08M MISC Test before meals and at bedtime.  300 each  0  . OxyCODONE (OXYCONTIN) 10 mg T12A 12 hr tablet Take 1 tablet (10 mg total) by mouth every 12 (twelve) hours.  60 tablet  0  . predniSONE (DELTASONE) 10 MG tablet Take 2 tablets by mouth every morning and 1 tablet at noon daily  270 tablet  0  . PREVIDENT 5000 PLUS 1.1 % CREA dental cream       . sertraline (ZOLOFT) 50 MG tablet ALTERNATE TAKING 1 TABLET DAILY AND 2 TABLETS DAILY  90 tablet  3  . Sodium Chloride Flush (NORMAL SALINE FLUSH) 0.9 % SOLN 10 mLs by Intracatheter route as needed (to flush Hickman).       Marland Kitchen sulfamethoxazole-trimethoprim (BACTRIM DS) 800-160 MG per tablet Take 1 tablet by mouth daily.      . tacrolimus (PROGRAF) 0.5 MG capsule TAKE 3 CAPSULES BY MOUTH TWO TIMES DAILY  180 capsule  2  . vancomycin (VANCOCIN) 125 MG capsule Take 1 capsule (125 mg total) by mouth 4 (four) times daily.  120 capsule  6  . VOLTAREN 1 % GEL       . zinc sulfate 220 MG capsule Take 220 mg by mouth daily.      Marland Kitchen oxyCODONE-acetaminophen (PERCOCET/ROXICET) 5-325 MG per tablet       . PROTEIN PO Take 6 g by mouth 3 (three) times daily.      .  [  DISCONTINUED] insulin aspart (NOVOLOG FLEXPEN) 100 UNIT/ML SOPN FlexPen 18units sq qam, 9units sq qpm, or as directed  15 mL  1   No current facility-administered medications for this visit.   Facility-Administered Medications Ordered in Other Visits  Medication Dose Route Frequency Provider Last Rate Last Dose  . 0.9 %  sodium chloride infusion   Intravenous Continuous Joslynne Klatt G Tannia Contino, PA-C 500 mL/hr at 03/12/13 0900    . sodium chloride 0.9 % injection 10 mL  10 mL Intravenous PRN Chauncey Cruel, MD   10 mL at 08/11/12 1606    OBJECTIVE: Middle-aged white male who appears stated age and is in no acute distress Filed Vitals:   02/23/14 1008  BP: 139/85  Pulse: 60  Temp: 98 F (36.7 C)  Resp: 18  Body mass index is 31.24 kg/(m^2).  ECOG: 1 Filed Weights   02/23/14 1008  Weight: 199 lb 8 oz (90.493 kg)    Remainder of physical exam was deferred today.   RESULTS:    CBC    Component Value Date/Time   WBC 6.8 02/23/2014 0949   WBC 7.3 08/18/2013 0725   RBC 3.84* 02/23/2014 0949   RBC 3.60* 08/18/2013 0725   RBC 3.82* 03/16/2013 1400   HGB 11.8* 02/23/2014 0949   HGB 11.2* 08/18/2013 0725   HCT 36.4* 02/23/2014 0949   HCT 33.8* 08/18/2013 0725   PLT 176 02/23/2014 0949   PLT 112* 08/18/2013 0725   MCV 94.8 02/23/2014 0949   MCV 93.9 08/18/2013 0725   MCH 30.7 02/23/2014 0949   MCH 31.1 08/18/2013 0725   MCHC 32.4 02/23/2014 0949   MCHC 33.1 08/18/2013 0725   RDW 16.0* 02/23/2014 0949   RDW 14.6 08/18/2013 0725   LYMPHSABS 2.3 02/23/2014 0949   LYMPHSABS 1.4 03/18/2013 0615   MONOABS 0.7 02/23/2014 0949   MONOABS 0.3 03/18/2013 0615   EOSABS 0.0 02/23/2014 0949   EOSABS 0.0 03/18/2013 0615   BASOSABS 0.1 02/23/2014 0949   BASOSABS 0.0 03/18/2013 0615        Chemistry      Component Value Date/Time   NA 143 02/23/2014 0949   NA 131* 06/14/2013 0800   K 5.2* 02/23/2014 0949   K 4.7 06/14/2013 0800   CL 101 06/15/2013 1034   CL 97 06/14/2013 0800   CO2 18* 02/23/2014 0949   CO2 24 06/14/2013 0800    BUN 49.3* 02/23/2014 0949   BUN 38* 06/14/2013 0800   CREATININE 1.2 02/23/2014 0949   CREATININE 0.97 06/14/2013 0800      Component Value Date/Time   CALCIUM 9.1 02/23/2014 0949   CALCIUM 9.1 06/14/2013 0800   ALKPHOS 81 02/23/2014 0949   ALKPHOS 75 04/18/2013 1218   AST 24 02/23/2014 0949   AST 24 04/18/2013 1218   ALT 35 02/23/2014 0949   ALT 33 04/18/2013 1218   BILITOT 0.27 02/23/2014 0949   BILITOT 0.2* 04/18/2013 1218     Magnesium  2.1  02/23/2014    1.9  02/16/2014    1.9  02/09/2014  Tacrolimus Level 7.3  02/16/2014    6.8  01/05/2014    13.2  11/24/2013  IgG   320  02/09/2014    375  01/26/2014    411  01/12/2014  LDH   191  02/09/2014    257  08/11/2013    182  04/04/2013  STUDIES:  Ct Soft Tissue Neck/Chest/Abdomen/Pelvis W Contrast 02/21/2014   CLINICAL DATA:  Chronic lymphocytic leukemia. Prior bone marrow  transplant.  EXAM: CT NECK WITH CONTRAST; CT ABDOMEN AND PELVIS WITH CONTRAST; CT CHEST WITH CONTRAST  TECHNIQUE: Multidetector CT imaging of Timothy neck was performed with intravenous contrast.; Multidetector CT imaging of Timothy abdomen and pelvis was performed following Timothy standard protocol during bolus administration of intravenous contrast.; Multidetector CT imaging of Timothy chest was performed following Timothy standard protocol during bolus administration of intravenous contrast.  CONTRAST:  144mL OMNIPAQUE IOHEXOL 300 MG/ML  SOLN  COMPARISON:  US ABDOMEN COMPLETE dated 08/23/2013; CT CHEST W/CM dated 08/17/2013; MR L SPINE WO/W CM dated 05/09/2013  FINDINGS: CT NECK FINDINGS  There is atherosclerotic calcification of Timothy cavernous carotid arteries bilaterally. Air-fluid level in Timothy left maxillary sinus compatible with acute superimposed on chronic sinusitis. Tortuous right common carotid artery causing some extrinsic mass effect on Timothy posterior right pharynx (DO NOT BIOPSY Timothy RIGHT POSTERIOR PHARYNX).  Tongue base partially obscured by streak artifact from Timothy [name]'s dental prostheses,  but no mass lesion is observed in this vicinity. Parapharyngeal space normal and symmetric. No pathologic adenopathy observed in neck.  CT CHEST FINDINGS  Timothy new no pathologic thoracic adenopathy. Minimal scarring in both lower lobes.  New anterior fractures of Timothy second and third ribs bilaterally. Healing fractures of Timothy anterior fourth ribs bilaterally. Old fracture of Timothy right anterior third rib. Healing fractures of Timothy left posterior eleventh and tenth ribs.  CT ABDOMEN AND PELVIS FINDINGS  Stable compression fractures at L5, L4, L3, T12.  Stable splenic size. Small benign hypodense lesion in segment 2 of Timothy liver. Mild gallbladder wall thickening. Pancreas, adrenal glands, and kidneys unremarkable.  Prominent stool throughout Timothy colon favors constipation. Stable umbilical hernia containing adipose tissue. No pathologic upper abdominal adenopathy is observed. No pathologic pelvic adenopathy is observed. Urinary bladder unremarkable. New fractures of both sacral ala are sclerotic and associated with Timothy anterior sacral irregularity, favoring insufficiency fractures.  Low-density right iliac bone lesion and, nonspecific.  IMPRESSION: 1. New fractures of Timothy anterior second and third ribs bilaterally. New insufficiency fractures of both sacral alae. 2. Healing fractures of Timothy anterior fourth ribs and left posterior eleventh and tenth ribs. Old lower thoracic and lumbar compression fractures. 3.  Prominent stool throughout Timothy colon favors constipation. 4. Umbilical hernia contains adipose tissue. 5. Acute on chronic left maxillary sinusitis. 6. No recurrent adenopathy observed. 7. Stable upper normal splenic size. 8. Stable low-density right iliac bone lesion, nonspecific.   Electronically Signed   By: Sherryl Barters M.D.   On: 02/21/2014 14:54     ASSESSMENT: 68 y.o. Timothy [name] man with a history of well-differentiated lymphocytic lymphoma/ chronic lymphoid leukemia initially diagnosed in 2000, not  requiring intervention until 2006; with multiple chromosomal abnormalities.  His treatment history is as follows:  (1) fludarabine/cyclophosphamide/rituximab x5 completed May 2007.   (2) rituximab for 8 doses October 2010, with partial response   (3) Leustatin and ofatumumab weekly x8 July to September 2011 followed by maintenance ofatumumab maintenance ofatumumab every 2 months, with initial response but rising counts September 2012   (4) status-post unrelated donor stem-cell transplant [facility]/24/2012 at [facility]  (a) conditioning regimen consisted of fludarabine + TBI at 200 cGy, followed by rituximab x27;  (b) CMV reactivation x3 ([name] CMV positive, donor negative), s/p ganciclovir treatment; 3d reactivation August 2013, s/p gancyclovir, with negative PCR mid-September 2013; last gancyclovir dose 10/06/2012 (c) Chronic GVHD: involving gut and skin, treated with steroids, tacrolimus and MMF.  MMF was eventually d/c'd and tacrolimus currently at a dose  of 1.'5mg'$  BID (d) atrial fibrillation: resolved on brief amiodarone regimen (e) steroid-induced myopathy: improving  (f) hypomagnesemia: improved after d/c gancyclovir (g) hypogammaglobulinemia: s/p IVIG most recently 01/07/2013. (h) history of elevated triglycerides (606 on 07/14/2012)  (i) adrenal insufficiency: on prednisone and budesonide (j) pancytopenia,resolved (k) brief episode of neutropenia (ANC 300) February 2015, accompanied by diarrhea; resolved   (5) restaging studies September 2013  including CT scans, flow cytometry, and bone marrow biopsy, showed no evidence of residual chronic lymphoid leukemia.  (6) recurrent GVHD (skin rash, mouth changes, severe diarrhea and gastric/duodenal/colonic biopsies 11/17/2012 c/w GVHD grade 2) : now at most grade 1 GI with no significant skin or mouth changes  (7)  malnutrition -- on VITAL supplement in addition to regular diet; on Marinol for anorexia  (8) testosterone deficiency--on patch    (9) deconditioning: ongoing REHAB   (10) mild dehydration: encouraged increased po fluids; receives IVF support w magnesium weekly  (11) steroid-induced osteoporosis with compression fractures: received pamidronate 12/18/2012. Status post kyphoplasty at L3-4 in June 2014. Also with evidence of rib fractures and insufficiency fractures bilaterally of Timothy sacral  alae, noted by CT in March 2015. --   Denosumab started 12/08/2013, to be repeated every 3 months.  (12) chronic back pain and hip pain controlled with OxyContin and hydrocodone/APAP.  (12) nausea: controlled on current meds  (13)  Positive c.diff, 03/08/2013, on Flagyl 500 mg TID x 20 days, then on oral vanco with Questran, showing improvement; positive when repeated April 2014; Negative x 3   Results for HARTWELL, VANDIVER (MRN [id number]) as of 02/02/2014 14:19  Ref. Range 03/08/2013 15:26 03/29/2013 13:45 12/08/2013 11:52 01/18/2014 14:08 01/27/2014 13:31  C difficile by pcr Latest Range: Negative  Positive (AA) Positive (AA) Negative Negative Negative   (14) persistently increased BUN  (15)  Hypertension, on labetalol, cardizem, lisinopril, and furosemide;Lisinopril increased by Dr. Brigitte Pulse November 2014   (16) steroid induced hyperglycemia, on sterlix and 70/30 insulin, hemoglobin A1c 5.2 November of 2014;  Followed by Dr. Loanne Drilling and Dr. Brigitte Pulse  (38) hypogammaglobulinemia-- requiring intermittent supplementation  (18) squamous cell CA in situ removed from left parietal scalp October 2014   PLAN: Our entire 25 minute appointment today was spent reviewing Gleason's recent scans as well as his recent labs, addressing his questions, reviewing his treatment plan, and coordinating care. This case was also reviewed with Dr. Jana Hakim today.   Our plan is for Rohn to receive his IV fluids and IV magnesium today as scheduled. He'll then be seen next week at  [facility] in [place] as planned. He'll receive IV fluids and potassium  here again on 03/09/2014, and will see Dr. Jana Hakim for followup on March 25.  When he returns on March 25, we will plan on proceeding with IVIG, and he will also be due for his next q. 3 month injection of denosumab.  Otherwise, Furqan's magnesium level has remained very stable, and he is getting only 1 g of magnesium IV weekly. He will discuss with Timothy physicians in [place] whether or not oral magnesium might be an option. Of course Timothy concern there is that it might cause increased diarrhea. Timothy other option would be to stretch out his appointments here, checking labs and giving him IV fluids with IV magnesium every 2 weeks rather than weekly beginning in April.    He will review this plan further with Dr. Jana Hakim when he returns in late March. All of Timothy above was reviewed in detail with  both Coralyn Mark and [place]. They both voice understanding and agreement with Timothy above plan, and as always know to call with any changes or problems.   Arvis Zwahlen, PA-C      02/23/2014   ADDENDUM: Vyncent is at a second anniversary from his transplant. He looks Timothy best that he has looked in Timothy last 2 years. A major concern we still have is Timothy problem of osteoporosis secondary to Timothy amount of steroids he received. We continue to try to drop Timothy prednisone, but obviously we have to go very slowly. I am hopeful we will get some advice on further management of this area after his trip to [place]"--he will see me shortly after he returns to implement any changes suggested.  I personally saw this [name] and performed a substantive portion of this encounter with Timothy listed APP documented above.   Chauncey Cruel, MD

## 2014-02-28 ENCOUNTER — Telehealth: Payer: Self-pay | Admitting: Oncology

## 2014-02-28 NOTE — Telephone Encounter (Signed)
FAXED PT [facility]

## 2014-03-09 ENCOUNTER — Other Ambulatory Visit: Payer: Self-pay | Admitting: Physician Assistant

## 2014-03-09 ENCOUNTER — Ambulatory Visit: Payer: Self-pay | Admitting: Physician Assistant

## 2014-03-09 ENCOUNTER — Ambulatory Visit (HOSPITAL_BASED_OUTPATIENT_CLINIC_OR_DEPARTMENT_OTHER): Payer: BC Managed Care – PPO

## 2014-03-09 ENCOUNTER — Ambulatory Visit: Payer: Self-pay

## 2014-03-09 ENCOUNTER — Other Ambulatory Visit: Payer: Self-pay

## 2014-03-09 ENCOUNTER — Other Ambulatory Visit (HOSPITAL_BASED_OUTPATIENT_CLINIC_OR_DEPARTMENT_OTHER): Payer: BC Managed Care – PPO

## 2014-03-09 DIAGNOSIS — Z9489 Other transplanted organ and tissue status: Secondary | ICD-10-CM

## 2014-03-09 DIAGNOSIS — C9110 Chronic lymphocytic leukemia of B-cell type not having achieved remission: Secondary | ICD-10-CM

## 2014-03-09 DIAGNOSIS — M25551 Pain in right hip: Secondary | ICD-10-CM

## 2014-03-09 DIAGNOSIS — T8609 Other complications of bone marrow transplant: Secondary | ICD-10-CM

## 2014-03-09 DIAGNOSIS — M25552 Pain in left hip: Secondary | ICD-10-CM

## 2014-03-09 DIAGNOSIS — G8929 Other chronic pain: Secondary | ICD-10-CM

## 2014-03-09 DIAGNOSIS — D89811 Chronic graft-versus-host disease: Secondary | ICD-10-CM

## 2014-03-09 DIAGNOSIS — M549 Dorsalgia, unspecified: Secondary | ICD-10-CM

## 2014-03-09 LAB — MAGNESIUM (CC13): Magnesium: 1.6 mg/dL (ref 1.5–2.5)

## 2014-03-09 MED ORDER — SODIUM CHLORIDE 0.9 % IV SOLN
Freq: Once | INTRAVENOUS | Status: AC
  Administered 2014-03-09: 12:00:00 via INTRAVENOUS
  Filled 2014-03-09: qty 500

## 2014-03-09 MED ORDER — SODIUM CHLORIDE 0.9 % IV SOLN
Freq: Once | INTRAVENOUS | Status: AC
  Administered 2014-03-09: 12:00:00 via INTRAVENOUS

## 2014-03-09 MED ORDER — HYDROCODONE-ACETAMINOPHEN 10-325 MG PO TABS: 1.0000 | ORAL_TABLET | Freq: Four times a day (QID) | ORAL | Status: DC | PRN

## 2014-03-09 NOTE — Patient Instructions (Signed)
Hypomagnesemia Magnesium is a common ion (mineral) in the body which is needed for metabolism. It is about how the body handles food and other chemical reactions necessary for life. Only about 2% of the magnesium in our body is found in the blood. When this is low, it is called hypomagnesemia. The blood will measure only a tiny amount of the magnesium in our body. When it is low in our blood, it does not mean that the whole body supply is low. The normal serum concentration ranges from 1.8-2.5 mEq/L. When the level gets to be less than 1.0 mEq/L, a number of problems begin to happen.  CAUSES   Receiving intravenous fluids without magnesium replacement.  Loss of magnesium from the bowel by naso-gastric suction.  Loss of magnesium from nausea and vomiting or severe diarrhea. Any of the inflammatory bowel conditions can cause this.  Abuse of alcohol often leads to low serum magnesium.  An inherited form of magnesium loss happens when the kidneys lose magnesium. This is called familial or primary hypomagnesemia.  Some medications such as diuretics also cause the loss of magnesium. SYMPTOMS  These following problems are worse if the changes in magnesium levels come on suddenly.  Tremor.  Confusion.  Muscle weakness.  Over-sensitive to sights and sounds.  Sensitive reflexes.  Depression.  Muscular fibrillations.  Over-reactivity of the nerves.  Irritability.  Psychosis.  Spasms of the hand muscles.  Tetany (where the muscles go into uncontrollable spasms). DIAGNOSIS  This condition can be diagnosed by blood tests. TREATMENT   In emergency, magnesium can be given intravenously (by vein).  If the condition is less worrisome, it can be corrected by diet. High levels of magnesium are found in green leafy vegetables, peas, beans and nuts among other things. It can also be given through medications by mouth.  If it is being caused by medications, changes can be made.  If  alcohol is a problem, help is available if there are difficulties giving it up. Document Released: 09/03/2005 Document Revised: 03/01/2012 Document Reviewed: 07/28/2008 [employer] Patient Information 2014 [employer].        

## 2014-03-14 ENCOUNTER — Other Ambulatory Visit: Payer: Self-pay | Admitting: *Deleted

## 2014-03-14 DIAGNOSIS — T8609 Other complications of bone marrow transplant: Secondary | ICD-10-CM

## 2014-03-14 DIAGNOSIS — C9110 Chronic lymphocytic leukemia of B-cell type not having achieved remission: Secondary | ICD-10-CM

## 2014-03-14 DIAGNOSIS — D89811 Chronic graft-versus-host disease: Secondary | ICD-10-CM

## 2014-03-15 ENCOUNTER — Telehealth: Payer: Self-pay | Admitting: Oncology

## 2014-03-15 ENCOUNTER — Ambulatory Visit (HOSPITAL_BASED_OUTPATIENT_CLINIC_OR_DEPARTMENT_OTHER): Payer: BC Managed Care – PPO | Admitting: Oncology

## 2014-03-15 ENCOUNTER — Ambulatory Visit (HOSPITAL_BASED_OUTPATIENT_CLINIC_OR_DEPARTMENT_OTHER): Payer: BC Managed Care – PPO

## 2014-03-15 ENCOUNTER — Other Ambulatory Visit (HOSPITAL_BASED_OUTPATIENT_CLINIC_OR_DEPARTMENT_OTHER): Payer: BC Managed Care – PPO

## 2014-03-15 ENCOUNTER — Ambulatory Visit: Payer: Self-pay

## 2014-03-15 VITALS — BP 123/56 | HR 72 | Temp 98.9°F | Resp 18 | Ht 67.0 in | Wt 198.8 lb

## 2014-03-15 DIAGNOSIS — N182 Chronic kidney disease, stage 2 (mild): Secondary | ICD-10-CM

## 2014-03-15 DIAGNOSIS — D899 Disorder involving the immune mechanism, unspecified: Secondary | ICD-10-CM

## 2014-03-15 DIAGNOSIS — N039 Chronic nephritic syndrome with unspecified morphologic changes: Secondary | ICD-10-CM

## 2014-03-15 DIAGNOSIS — M818 Other osteoporosis without current pathological fracture: Secondary | ICD-10-CM

## 2014-03-15 DIAGNOSIS — K5790 Diverticulosis of intestine, part unspecified, without perforation or abscess without bleeding: Secondary | ICD-10-CM

## 2014-03-15 DIAGNOSIS — D839 Common variable immunodeficiency, unspecified: Secondary | ICD-10-CM

## 2014-03-15 DIAGNOSIS — D89811 Chronic graft-versus-host disease: Secondary | ICD-10-CM

## 2014-03-15 DIAGNOSIS — T8609 Other complications of bone marrow transplant: Secondary | ICD-10-CM

## 2014-03-15 DIAGNOSIS — C9111 Chronic lymphocytic leukemia of B-cell type in remission: Secondary | ICD-10-CM

## 2014-03-15 DIAGNOSIS — G252 Other specified forms of tremor: Secondary | ICD-10-CM

## 2014-03-15 DIAGNOSIS — D649 Anemia, unspecified: Secondary | ICD-10-CM

## 2014-03-15 DIAGNOSIS — M810 Age-related osteoporosis without current pathological fracture: Secondary | ICD-10-CM | POA: Insufficient documentation

## 2014-03-15 DIAGNOSIS — C9110 Chronic lymphocytic leukemia of B-cell type not having achieved remission: Secondary | ICD-10-CM

## 2014-03-15 DIAGNOSIS — T380X5A Adverse effect of glucocorticoids and synthetic analogues, initial encounter: Secondary | ICD-10-CM

## 2014-03-15 DIAGNOSIS — E139 Other specified diabetes mellitus without complications: Secondary | ICD-10-CM

## 2014-03-15 DIAGNOSIS — K449 Diaphragmatic hernia without obstruction or gangrene: Secondary | ICD-10-CM

## 2014-03-15 DIAGNOSIS — E099 Drug or chemical induced diabetes mellitus without complications: Secondary | ICD-10-CM

## 2014-03-15 DIAGNOSIS — E860 Dehydration: Secondary | ICD-10-CM

## 2014-03-15 DIAGNOSIS — D849 Immunodeficiency, unspecified: Secondary | ICD-10-CM

## 2014-03-15 DIAGNOSIS — G8929 Other chronic pain: Secondary | ICD-10-CM

## 2014-03-15 DIAGNOSIS — Z9489 Other transplanted organ and tissue status: Secondary | ICD-10-CM

## 2014-03-15 DIAGNOSIS — D631 Anemia in chronic kidney disease: Secondary | ICD-10-CM

## 2014-03-15 DIAGNOSIS — M549 Dorsalgia, unspecified: Principal | ICD-10-CM

## 2014-03-15 LAB — COMPREHENSIVE METABOLIC PANEL (CC13)
ALBUMIN: 3.6 g/dL (ref 3.5–5.0)
ALT: 32 U/L (ref 0–55)
AST: 18 U/L (ref 5–34)
Alkaline Phosphatase: 78 U/L (ref 40–150)
Anion Gap: 9 mEq/L (ref 3–11)
BUN: 51.9 mg/dL — AB (ref 7.0–26.0)
CHLORIDE: 114 meq/L — AB (ref 98–109)
CO2: 13 mEq/L — ABNORMAL LOW (ref 22–29)
Calcium: 8.6 mg/dL (ref 8.4–10.4)
Creatinine: 1.4 mg/dL — ABNORMAL HIGH (ref 0.7–1.3)
GLUCOSE: 302 mg/dL — AB (ref 70–140)
Potassium: 5.4 mEq/L — ABNORMAL HIGH (ref 3.5–5.1)
Sodium: 136 mEq/L (ref 136–145)
Total Bilirubin: 0.21 mg/dL (ref 0.20–1.20)
Total Protein: 5.6 g/dL — ABNORMAL LOW (ref 6.4–8.3)

## 2014-03-15 LAB — CBC WITH DIFFERENTIAL/PLATELET
BASO%: 0.2 % (ref 0.0–2.0)
Basophils Absolute: 0 10*3/uL (ref 0.0–0.1)
EOS%: 0.4 % (ref 0.0–7.0)
Eosinophils Absolute: 0 10*3/uL (ref 0.0–0.5)
HEMATOCRIT: 38.9 % (ref 38.4–49.9)
HGB: 13 g/dL (ref 13.0–17.1)
LYMPH%: 32.4 % (ref 14.0–49.0)
MCH: 31.1 pg (ref 27.2–33.4)
MCHC: 33.4 g/dL (ref 32.0–36.0)
MCV: 93.1 fL (ref 79.3–98.0)
MONO#: 0.7 10*3/uL (ref 0.1–0.9)
MONO%: 7.8 % (ref 0.0–14.0)
NEUT%: 59.2 % (ref 39.0–75.0)
NEUTROS ABS: 4.9 10*3/uL (ref 1.5–6.5)
PLATELETS: 185 10*3/uL (ref 140–400)
RBC: 4.18 10*6/uL — ABNORMAL LOW (ref 4.20–5.82)
RDW: 15.4 % — ABNORMAL HIGH (ref 11.0–14.6)
WBC: 8.3 10*3/uL (ref 4.0–10.3)
lymph#: 2.7 10*3/uL (ref 0.9–3.3)
nRBC: 0 % (ref 0–0)

## 2014-03-15 LAB — MAGNESIUM (CC13): Magnesium: 2.2 mg/dl (ref 1.5–2.5)

## 2014-03-15 MED ORDER — IMMUNE GLOBULIN (HUMAN) 10 GM/100ML IV SOLN
1.0000 g/kg | Freq: Once | INTRAVENOUS | Status: AC
  Administered 2014-03-15: 90 g via INTRAVENOUS
  Filled 2014-03-15: qty 900

## 2014-03-15 MED ORDER — SODIUM CHLORIDE 0.9 % IV SOLN
Freq: Once | INTRAVENOUS | Status: AC
  Administered 2014-03-15: 12:00:00 via INTRAVENOUS

## 2014-03-15 MED ORDER — IMMUNE GLOBULIN (HUMAN) 10 GM/100ML IV SOLN: 1.0000 g/kg | Freq: Once | INTRAVENOUS | Status: DC

## 2014-03-15 MED ORDER — SODIUM CHLORIDE 0.9 % IV SOLN
Freq: Once | INTRAVENOUS | Status: AC
  Administered 2014-03-15: 13:00:00 via INTRAVENOUS
  Filled 2014-03-15: qty 500

## 2014-03-15 MED ORDER — DENOSUMAB 120 MG/1.7ML ~~LOC~~ SOLN
120.0000 mg | Freq: Once | SUBCUTANEOUS | Status: DC
  Filled 2014-03-15: qty 1.7

## 2014-03-15 NOTE — Telephone Encounter (Signed)
gv pt appt schedule for april thru june °

## 2014-03-15 NOTE — Patient Instructions (Signed)

## 2014-03-16 ENCOUNTER — Other Ambulatory Visit: Payer: Self-pay | Admitting: Oncology

## 2014-03-18 NOTE — Progress Notes (Signed)
ID: Timothy [name]   DOB: Apr 22, 1946  MR#: [id number]  CWC#:[id number]  VOH:YWVP, [name], MD SU: OTHER MD: [name]; [name], MD; [name], [name],MD; [name], MD; [facility] in [place], [place]:  [name], RN [phone])  CHIEF COMPLAINT:  CLL, status post allogeneic stem cell transplant   HISTORY OF PRESENT ILLNESS: We have very complete records from Dr. Racheal Patches in [place], and in summary:  The patient was initially diagnosed in August 2000, with a white cell count of 23,600, but normal hemoglobin and platelets, and no significant symptomatology. Over the next several years his white cell count drifted up, and he eventually developed some symptoms of night sweats in particular, leading to treatment with fludarabine, Cytoxan and rituxan for five cycles given between December 2006 and May 2007.  We have CT scans from June 2006, November 2006 and April 2007, and comparing the November 2006 and April 2007 scans, there was near complete response. He had subsequent therapy in [place] as detailed below, but with decreased response, leading to allogeneic stem-cell transplant at the [facility] 02/24/2012.  Subsequent history is as detailed below.  INTERVAL HISTORY: Cabot returns today accompanied by his wife Timothy [name] for followup of his chronic lymphoid leukemia status post allogenic transplant. Since his last visit here he was reevaluated in [place]. I have not yet received their formal report, blood perTerry's statement they found no evidence of active chronic lymphoid leukemia and also well-controlled graft-versus-host disease.--Date suggested we start tapering off the tacrolimus, but as soon as Hagan dropped the dose by 1 mg his diarrhea became worse. He immediately returned to the earlier doses, and things are better, although not back to normal.  REVIEW OF SYSTEMS: Eoghan is having significant problems  from osteoporosis, which is doubtless steroid-induced. Not only does he have multiple compression and rib fractures, he is losing height is well. Aside from this he denies fevers, rash, bleeding, drenching sweats, or unusual fatigue. He continues to progress slowly through physical therapy. His bowel movements currently are still softer than usual and a bit more numerous, (4-6 versus 3-4 day) but still somewhat formed. There has been no blood in his stool. There have been no abdominal cramps. He has no nausea or vomiting. A detailed review of systems today was otherwise stable   PAST MEDICAL HISTORY: Past Medical History  Diagnosis Date  . 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 Diverticular disease   . Hyperlipidemia   . Obesity   . Hypertension   . Hiatal hernia   . CMV (cytomegalovirus) antibody positive     pre-transplant, with seroconversion x2 pst-transplant  . Right bundle branch block     pre-transplant  . CKD (chronic kidney disease) stage 2, GFR 60-89 ml/min   . Pancytopenia   . Steroid-induced diabetes   . Atrial fibrillation     post-transplant  . Myopathy   . 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 Leukemia, chronic lymphoid   . 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 Diarrhea in adult patient 12/05/2012    Due to active GVHD  . CLL (chronic lymphocytic leukemia) 12/05/2012    Dx 07/1999; started Rx 12/06  AlloBMT 3/13  . Rash of face 12/05/2012    Due to GVHD  . Hypomagnesemia 01/26/2013  . Left hip pain 12/01/2013    PAST SURGICAL HISTORY: Past Surgical History  Procedure  Laterality Date  . Tonsillectomy and adenoidectomy    . Bone marrow transplant    . Flexible sigmoidoscopy  11/17/2012    Procedure: FLEXIBLE SIGMOIDOSCOPY;  Surgeon: Jeryl Columbia, MD;  Location: [facility] ENDOSCOPY;  Service: Endoscopy;  Laterality: N/A;  Dr Watt Climes states will be admitted to  [facility] 11/16/12  . Esophagogastroduodenoscopy  11/17/2012    Procedure: ESOPHAGOGASTRODUODENOSCOPY (EGD);  Surgeon: Jeryl Columbia, MD;  Location: [facility];  Service: Endoscopy;  Laterality: N/A;    FAMILY HISTORY Family History  Problem Relation Age of Onset  . Cancer Father    The patient's father died from complications of chronic lymphocytic leukemia at the age of 61.  It had been diagnosed seven years before when he was 63.  The patient is enrolled in a familial chronic lymphocytic leukemia study out of the [employer].  The patient's mother is 54, alive, unfortunately suffering with dementia, and he has a brother, 40, who is otherwise in fair health.   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  His wife of >40 years, Timothy [name], is a homemaker.  Their daughter, Timothy [name], lives in [place].  She also is a Agricultural engineer.  The patient has an 68 year old grandson and an 58-year-old granddaughter, and that is really the main reason he moved to this area.  He is a Tourist information centre manager.     ADVANCED DIRECTIVES: In place  HEALTH MAINTENANCE: (Updated 02/23/2014) History  Substance Use Topics  . Smoking status: Never Smoker   . Smokeless tobacco: Never Used  . Alcohol Use: No     Colonoscopy: Nov 2013, Dr. Watt Climes  PSA: Not on file  Bone density:  Feb 2014;  Patient also has known insufficiency and pathologic fractures in addition to his long-standing history of steroid use.  Lipid panel: Jan 2015, elevated    Allergies  Allergen Reactions  . Benadryl [Diphenhydramine Hcl]     "Restless leg syndrome"    Current Outpatient Prescriptions  Medication Sig Dispense Refill  . acyclovir (ZOVIRAX) 400 MG tablet Take 2 tablets (800 mg total) by mouth 2 (two) times daily.  120 tablet  3  . B-D UF III MINI PEN NEEDLES 31G X 5 MM MISC       .  budesonide (ENTOCORT EC) 3 MG 24 hr capsule TAKE 1 CAPSULE BY MOUTH 3 TIMES DAILY  90 capsule  1  . calcium citrate-vitamin D (CITRACAL+D) 315-200 MG-UNIT per tablet Take 1 tablet by mouth 2 (two) times daily.      . cholecalciferol (VITAMIN D) 1000 UNITS tablet Take 1,000 Units by mouth every evening.       . cholestyramine (QUESTRAN) 4 G packet Take 1 packet by mouth 3 (three) times daily with meals.      Marland Kitchen diltiazem (CARDIZEM CD) 240 MG 24 hr capsule TAKE 1 CAPSULE BY MOUTH DAILY  30 capsule  3  . ferrous sulfate 325 (65 FE) MG tablet Take 325 mg by mouth daily with breakfast.      . fluconazole (DIFLUCAN) 100 MG tablet Take 1 tablet (100 mg total) by mouth daily.  30 tablet  6  . furosemide (LASIX) 20 MG tablet TAKE 1 TABLET BY MOUTH EVERY MORNING  30 tablet  2  . glucose blood (ONE TOUCH ULTRA TEST) test strip Test before meals and at bedtime.  300 each  0  .  Heparin Lock Flush (HEPARIN FLUSH, PORCINE,) 100 UNIT/ML injection 300 Units by Intracatheter route as needed (to flush Hickman port).       Marland Kitchen HYDROcodone-acetaminophen (NORCO) 10-325 MG per tablet Take 1-2 tablets by mouth every 6 (six) hours as needed for moderate pain.  120 tablet  0  . Insulin Aspart Prot & Aspart (70-30) 100 UNIT/ML SUPN Inject 10 Units into the skin daily with breakfast.       . labetalol (NORMODYNE) 200 MG tablet Take 200 mg by mouth. Take 2 tablets by mouth 2 times daily      . Lidocaine-Hydrocortisone Ace 3-0.5 % KIT Apply 1 application topically as needed (for pain).       Marland Kitchen lisinopril (PRINIVIL,ZESTRIL) 10 MG tablet Take 40 mg by mouth every evening.      . Multiple Vitamin (MULTIVITAMIN WITH MINERALS) TABS tablet Take 1 tablet by mouth daily.      Marland Kitchen omeprazole (PRILOSEC) 20 MG capsule Take 20 mg by mouth daily.      Glory Rosebush DELICA LANCETS 18A MISC Test before meals and at bedtime.  300 each  0  . OxyCODONE (OXYCONTIN) 10 mg T12A 12 hr tablet Take 1 tablet (10 mg total) by mouth every 12 (twelve) hours.  60  tablet  0  . oxyCODONE-acetaminophen (PERCOCET/ROXICET) 5-325 MG per tablet       . predniSONE (DELTASONE) 10 MG tablet Take 2 tablets by mouth every morning and 1 tablet at noon daily  270 tablet  0  . PREVIDENT 5000 PLUS 1.1 % CREA dental cream       . PROTEIN PO Take 6 g by mouth 3 (three) times daily.      . sertraline (ZOLOFT) 50 MG tablet ALTERNATE TAKING 1 TABLET DAILY AND 2 TABLETS DAILY  90 tablet  3  . Sodium Chloride Flush (NORMAL SALINE FLUSH) 0.9 % SOLN 10 mLs by Intracatheter route as needed (to flush Hickman).       Marland Kitchen sulfamethoxazole-trimethoprim (BACTRIM DS) 800-160 MG per tablet Take 1 tablet by mouth daily.      . tacrolimus (PROGRAF) 0.5 MG capsule TAKE 3 CAPSULES BY MOUTH TWO TIMES DAILY  180 capsule  2  . vancomycin (VANCOCIN) 125 MG capsule Take 1 capsule (125 mg total) by mouth 4 (four) times daily.  120 capsule  6  . VOLTAREN 1 % GEL       . zinc sulfate 220 MG capsule Take 220 mg by mouth daily.      . [DISCONTINUED] insulin aspart (NOVOLOG FLEXPEN) 100 UNIT/ML SOPN FlexPen 18units sq qam, 9units sq qpm, or as directed  15 mL  1   No current facility-administered medications for this visit.   Facility-Administered Medications Ordered in Other Visits  Medication Dose Route Frequency Provider Last Rate Last Dose  . 0.9 %  sodium chloride infusion   Intravenous Continuous Amy G Berry, PA-C 500 mL/hr at 03/12/13 0900    . sodium chloride 0.9 % injection 10 mL  10 mL Intravenous PRN Chauncey Cruel, MD   10 mL at 08/11/12 1606    OBJECTIVE: Middle-aged white male who appears stated age  58 Vitals:   03/15/14 1345  BP: 123/56  Pulse: 72  Temp: 98.9 F (37.2 C)  Resp: 18  Body mass index is 31.13 kg/(m^2).  ECOG: 1 Filed Weights   03/15/14 1017  Weight: 198 lb 12.8 oz (90.175 kg)    Sclerae unicteric, pupils equal and reactive Oropharynx clear and moist-- no  thrush or other lesions noted No cervical or supraclavicular adenopathy Lungs no rales or rhonchi,  good excursion bilaterally Heart regular rate and rhythm Abd soft, nontender, positive bowel sounds MSK progressive kyphosis but no focal spinal tenderness, mild rib tenderness to palpation Neuro: nonfocal, well oriented, appropriate affect Skin: No rash, no unusual skin dryness, good turgor, no evidence of active graft-versus-host disease:   RESULTS:  Results for TIRAN, SAUSEDA (MRN [id number]) as of 03/18/2014 19:35  Ref. Range 02/09/2014 11:26 02/16/2014 10:38 02/23/2014 09:49 03/09/2014 11:24 03/15/2014 09:55  Magnesium Latest Range: 1.5-2.5 mg/dl 1.9 1.9 2.1 1.6 2.2   Results for ROHAN, JUENGER (MRN [id number]) as of 03/18/2014 19:35  Ref. Range 03/23/2013 10:21 03/28/2013 11:53 04/04/2013 08:55 08/11/2013 11:12 02/09/2014 11:26  LDH Latest Range: 125-245 U/L 173 184 182 257 (H) 191  Results for SHAMEEK, NYQUIST (MRN [id number]) as of 03/18/2014 19:35  Ref. Range 12/16/2013 11:46 12/29/2013 08:55 01/12/2014 13:49 01/26/2014 10:43 02/09/2014 11:26  IgG (Immunoglobin G), Serum Latest Range: [phone] [phone] mg/dL 789 583 (L) 411 (L) 375 (L) 320 (L)  Results for JAQUALIN, SERPA (MRN [id number]) as of 03/18/2014 19:35  Ref. Range 11/26/2012 04:30 12/13/2012 16:25 12/20/2012 05:00 12/27/2012 04:40 03/16/2013 14:10  Tacrolimus (FK506) - [facility] No range found 5.6 6.1 ng/mL 10.0 21.2 6.1   CBC    Component Value Date/Time   WBC 8.3 03/15/2014 0954   WBC 7.3 08/18/2013 0725   RBC 4.18* 03/15/2014 0954   RBC 3.60* 08/18/2013 0725   RBC 3.82* 03/16/2013 1400   HGB 13.0 03/15/2014 0954   HGB 11.2* 08/18/2013 0725   HCT 38.9 03/15/2014 0954   HCT 33.8* 08/18/2013 0725   PLT 185 03/15/2014 0954   PLT 112* 08/18/2013 0725   MCV 93.1 03/15/2014 0954   MCV 93.9 08/18/2013 0725   MCH 31.1 03/15/2014 0954   MCH 31.1 08/18/2013 0725   MCHC 33.4 03/15/2014 0954   MCHC 33.1 08/18/2013 0725   RDW 15.4* 03/15/2014 0954   RDW 14.6 08/18/2013 0725   LYMPHSABS 2.7 03/15/2014 0954   LYMPHSABS 1.4 03/18/2013 0615   MONOABS 0.7 03/15/2014 0954   MONOABS  0.3 03/18/2013 0615   EOSABS 0.0 03/15/2014 0954   EOSABS 0.0 03/18/2013 0615   BASOSABS 0.0 03/15/2014 0954   BASOSABS 0.0 03/18/2013 0615        Chemistry      Component Value Date/Time   NA 136 03/15/2014 0955   NA 131* 06/14/2013 0800   K 5.4* 03/15/2014 0955   K 4.7 06/14/2013 0800   CL 101 06/15/2013 1034   CL 97 06/14/2013 0800   CO2 13* 03/15/2014 0955   CO2 24 06/14/2013 0800   BUN 51.9* 03/15/2014 0955   BUN 38* 06/14/2013 0800   CREATININE 1.4* 03/15/2014 0955   CREATININE 0.97 06/14/2013 0800      Component Value Date/Time   CALCIUM 8.6 03/15/2014 0955   CALCIUM 9.1 06/14/2013 0800   ALKPHOS 78 03/15/2014 0955   ALKPHOS 75 04/18/2013 1218   AST 18 03/15/2014 0955   AST 24 04/18/2013 1218   ALT 32 03/15/2014 0955   ALT 33 04/18/2013 1218   BILITOT 0.21 03/15/2014 0955   BILITOT 0.2* 04/18/2013 1218     Magnesium  2.1  02/23/2014    1.9  02/16/2014    1.9  02/09/2014  Tacrolimus Level 7.3  02/16/2014    6.8  01/05/2014    13.2  11/24/2013  IgG   320  02/09/2014    375  01/26/2014  411  01/12/2014  LDH   191  02/09/2014    257  08/11/2013    182  04/04/2013  STUDIES: Ct Soft Tissue Neck W Contrast  02/21/2014   CLINICAL DATA:  Chronic lymphocytic leukemia. Prior bone marrow transplant.  EXAM: CT NECK WITH CONTRAST; CT ABDOMEN AND PELVIS WITH CONTRAST; CT CHEST WITH CONTRAST  TECHNIQUE: Multidetector CT imaging of the neck was performed with intravenous contrast.; Multidetector CT imaging of the abdomen and pelvis was performed following the standard protocol during bolus administration of intravenous contrast.; Multidetector CT imaging of the chest was performed following the standard protocol during bolus administration of intravenous contrast.  CONTRAST:  136mL OMNIPAQUE IOHEXOL 300 MG/ML  SOLN  COMPARISON:  US ABDOMEN COMPLETE dated 08/23/2013; CT CHEST W/CM dated 08/17/2013; MR L SPINE WO/W CM dated 05/09/2013  FINDINGS: CT NECK FINDINGS  There is atherosclerotic calcification  of the cavernous carotid arteries bilaterally. Air-fluid level in the left maxillary sinus compatible with acute superimposed on chronic sinusitis. Tortuous right common carotid artery causing some extrinsic mass effect on the posterior right pharynx (DO NOT BIOPSY THE RIGHT POSTERIOR PHARYNX).  Tongue base partially obscured by streak artifact from the patient's dental prostheses, but no mass lesion is observed in this vicinity. Parapharyngeal space normal and symmetric. No pathologic adenopathy observed in neck.  CT CHEST FINDINGS  The new no pathologic thoracic adenopathy. Minimal scarring in both lower lobes.  New anterior fractures of the second and third ribs bilaterally. Healing fractures of the anterior fourth ribs bilaterally. Old fracture of the right anterior third rib. Healing fractures of the left posterior eleventh and tenth ribs.  CT ABDOMEN AND PELVIS FINDINGS  Stable compression fractures at L5, L4, L3, T12.  Stable splenic size. Small benign hypodense lesion in segment 2 of the liver. Mild gallbladder wall thickening. Pancreas, adrenal glands, and kidneys unremarkable.  Prominent stool throughout the colon favors constipation. Stable umbilical hernia containing adipose tissue. No pathologic upper abdominal adenopathy is observed. No pathologic pelvic adenopathy is observed. Urinary bladder unremarkable. New fractures of both sacral ala are sclerotic and associated with the anterior sacral irregularity, favoring insufficiency fractures.  Low-density right iliac bone lesion and, nonspecific.  IMPRESSION: 1. New fractures of the anterior second and third ribs bilaterally. New insufficiency fractures of both sacral alae. 2. Healing fractures of the anterior fourth ribs and left posterior eleventh and tenth ribs. Old lower thoracic and lumbar compression fractures. 3.  Prominent stool throughout the colon favors constipation. 4. Umbilical hernia contains adipose tissue. 5. Acute on chronic left  maxillary sinusitis. 6. No recurrent adenopathy observed. 7. Stable upper normal splenic size. 8. Stable low-density right iliac bone lesion, nonspecific.   Electronically Signed   By: Sherryl Barters M.D.   On: 02/21/2014 14:54   Ct Chest W Contrast  02/21/2014   CLINICAL DATA:  Chronic lymphocytic leukemia. Prior bone marrow transplant.  EXAM: CT NECK WITH CONTRAST; CT ABDOMEN AND PELVIS WITH CONTRAST; CT CHEST WITH CONTRAST  TECHNIQUE: Multidetector CT imaging of the neck was performed with intravenous contrast.; Multidetector CT imaging of the abdomen and pelvis was performed following the standard protocol during bolus administration of intravenous contrast.; Multidetector CT imaging of the chest was performed following the standard protocol during bolus administration of intravenous contrast.  CONTRAST:  147mL OMNIPAQUE IOHEXOL 300 MG/ML  SOLN  COMPARISON:  US ABDOMEN COMPLETE dated 08/23/2013; CT CHEST W/CM dated 08/17/2013; MR L SPINE WO/W CM dated 05/09/2013  FINDINGS: CT NECK FINDINGS  There  is atherosclerotic calcification of the cavernous carotid arteries bilaterally. Air-fluid level in the left maxillary sinus compatible with acute superimposed on chronic sinusitis. Tortuous right common carotid artery causing some extrinsic mass effect on the posterior right pharynx (DO NOT BIOPSY THE RIGHT POSTERIOR PHARYNX).  Tongue base partially obscured by streak artifact from the patient's dental prostheses, but no mass lesion is observed in this vicinity. Parapharyngeal space normal and symmetric. No pathologic adenopathy observed in neck.  CT CHEST FINDINGS  The new no pathologic thoracic adenopathy. Minimal scarring in both lower lobes.  New anterior fractures of the second and third ribs bilaterally. Healing fractures of the anterior fourth ribs bilaterally. Old fracture of the right anterior third rib. Healing fractures of the left posterior eleventh and tenth ribs.  CT ABDOMEN AND PELVIS FINDINGS  Stable  compression fractures at L5, L4, L3, T12.  Stable splenic size. Small benign hypodense lesion in segment 2 of the liver. Mild gallbladder wall thickening. Pancreas, adrenal glands, and kidneys unremarkable.  Prominent stool throughout the colon favors constipation. Stable umbilical hernia containing adipose tissue. No pathologic upper abdominal adenopathy is observed. No pathologic pelvic adenopathy is observed. Urinary bladder unremarkable. New fractures of both sacral ala are sclerotic and associated with the anterior sacral irregularity, favoring insufficiency fractures.  Low-density right iliac bone lesion and, nonspecific.  IMPRESSION: 1. New fractures of the anterior second and third ribs bilaterally. New insufficiency fractures of both sacral alae. 2. Healing fractures of the anterior fourth ribs and left posterior eleventh and tenth ribs. Old lower thoracic and lumbar compression fractures. 3.  Prominent stool throughout the colon favors constipation. 4. Umbilical hernia contains adipose tissue. 5. Acute on chronic left maxillary sinusitis. 6. No recurrent adenopathy observed. 7. Stable upper normal splenic size. 8. Stable low-density right iliac bone lesion, nonspecific.   Electronically Signed   By: Sherryl Barters M.D.   On: 02/21/2014 14:54   Ct Abdomen Pelvis W Contrast  02/21/2014   CLINICAL DATA:  Chronic lymphocytic leukemia. Prior bone marrow transplant.  EXAM: CT NECK WITH CONTRAST; CT ABDOMEN AND PELVIS WITH CONTRAST; CT CHEST WITH CONTRAST  TECHNIQUE: Multidetector CT imaging of the neck was performed with intravenous contrast.; Multidetector CT imaging of the abdomen and pelvis was performed following the standard protocol during bolus administration of intravenous contrast.; Multidetector CT imaging of the chest was performed following the standard protocol during bolus administration of intravenous contrast.  CONTRAST:  159mL OMNIPAQUE IOHEXOL 300 MG/ML  SOLN  COMPARISON:  US ABDOMEN  COMPLETE dated 08/23/2013; CT CHEST W/CM dated 08/17/2013; MR L SPINE WO/W CM dated 05/09/2013  FINDINGS: CT NECK FINDINGS  There is atherosclerotic calcification of the cavernous carotid arteries bilaterally. Air-fluid level in the left maxillary sinus compatible with acute superimposed on chronic sinusitis. Tortuous right common carotid artery causing some extrinsic mass effect on the posterior right pharynx (DO NOT BIOPSY THE RIGHT POSTERIOR PHARYNX).  Tongue base partially obscured by streak artifact from the patient's dental prostheses, but no mass lesion is observed in this vicinity. Parapharyngeal space normal and symmetric. No pathologic adenopathy observed in neck.  CT CHEST FINDINGS  The new no pathologic thoracic adenopathy. Minimal scarring in both lower lobes.  New anterior fractures of the second and third ribs bilaterally. Healing fractures of the anterior fourth ribs bilaterally. Old fracture of the right anterior third rib. Healing fractures of the left posterior eleventh and tenth ribs.  CT ABDOMEN AND PELVIS FINDINGS  Stable compression fractures at L5, L4, L3, T12.  Stable splenic size. Small benign hypodense lesion in segment 2 of the liver. Mild gallbladder wall thickening. Pancreas, adrenal glands, and kidneys unremarkable.  Prominent stool throughout the colon favors constipation. Stable umbilical hernia containing adipose tissue. No pathologic upper abdominal adenopathy is observed. No pathologic pelvic adenopathy is observed. Urinary bladder unremarkable. New fractures of both sacral ala are sclerotic and associated with the anterior sacral irregularity, favoring insufficiency fractures.  Low-density right iliac bone lesion and, nonspecific.  IMPRESSION: 1. New fractures of the anterior second and third ribs bilaterally. New insufficiency fractures of both sacral alae. 2. Healing fractures of the anterior fourth ribs and left posterior eleventh and tenth ribs. Old lower thoracic and lumbar  compression fractures. 3.  Prominent stool throughout the colon favors constipation. 4. Umbilical hernia contains adipose tissue. 5. Acute on chronic left maxillary sinusitis. 6. No recurrent adenopathy observed. 7. Stable upper normal splenic size. 8. Stable low-density right iliac bone lesion, nonspecific.   Electronically Signed   By: Sherryl Barters M.D.   On: 02/21/2014 14:54   nonspecific.   Electronically Signed   By: Sherryl Barters M.D.   On: 02/21/2014 14:54     ASSESSMENT: 68 y.o. [place] man with a history of well-differentiated lymphocytic lymphoma/ chronic lymphoid leukemia initially diagnosed in 2000, not requiring intervention until 2006; with multiple chromosomal abnormalities.  His treatment history is as follows:  (1) fludarabine/cyclophosphamide/rituximab x5 completed May 2007.   (2) rituximab for 8 doses October 2010, with partial response   (3) Leustatin and ofatumumab weekly x8 July to September 2011 followed by maintenance ofatumumab  every 2 months, with initial response but rising counts September 2012   (4) status-post unrelated donor stem-cell transplant 02/24/2012 [facility]  (a) conditioning regimen consisted of fludarabine + TBI at 200 cGy, followed by rituximab x27;  (b) CMV reactivation x3 (patient CMV positive, donor negative), s/p ganciclovir treatment; 3d reactivation August 2013, s/p gancyclovir, with negative PCR mid-September 2013; last gancyclovir dose 10/06/2012 (c) Chronic GVHD: involving gut and skin, treated with steroids, tacrolimus and MMF.  MMF was eventually d/c'd and tacrolimus currently at a dose of 1.$RemoveB'5mg'[id number]$  BID (d) atrial fibrillation: resolved on brief amiodarone regimen (e) steroid-induced myopathy: improving  (f) hypomagnesemia: improved after d/c gancyclovir, needs continuing support (g) hypogammaglobulinemia: requiring IVIG most recently 03/15/2014. (h) history of elevated triglycerides (606 on 07/14/2012)  (i) adrenal  insufficiency: on prednisone and budesonide (j) pancytopenia,resolved (k) brief episode of neutropenia (ANC 300) February 2015, accompanied by diarrhea; resolved   (5) restaging studies September 2013  including CT scans, flow cytometry, and bone marrow biopsy, showed no evidence of residual chronic lymphoid leukemia.  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 Status post kyphoplasty at L3-4 in June 2014. Also with evidence of rib fractures and insufficiency fractures bilaterally of the sacral  alae, noted by CT in March 2015. --   Denosumab started 12/08/2013, initially every 3 months, but with symptomatic progression of his osteoporosis will give monthly x6 then switch to prolia Q6 months for maintenance  (12) chronic  back pain and hip pain controlled with OxyContin and hydrocodone/APAP.  (12) nausea: well controlled on current meds  (13)  Positive c.diff, 03/08/2013, on Flagyl 500 mg TID x 20 days, then on oral vanco with Questran, showing improvement; positive when repeated April 2014; Negative x 3   Results for JONDAVID, SCHREIER (MRN [id number]) as of 02/02/2014 14:19  Ref. Range 03/08/2013 15:26 03/29/2013 13:45 12/08/2013 11:52 01/18/2014 14:08 01/27/2014 13:31  C difficile by pcr Latest Range: Negative  Positive (AA) Positive (AA) Negative Negative Negative   (14) persistently increased BUN  (15)  Hypertension, on labetalol, cardizem, lisinopril, and furosemide;Lisinopril  increased by Dr. Brigitte Pulse November 2014   (16) steroid induced hyperglycemia, on sterlix and 70/30 insulin, hemoglobin A1c 5.2 November of 2014;  Followed by Dr. Loanne Drilling and Dr. Brigitte Pulse  (17) hypogammaglobulinemia-- requiring intermittent supplementation  (18) squamous cell CA in situ removed from left parietal scalp October 2014   PLAN: We spent approximately 45 minutes today going over carries situation. I am delighted that tear he is doing so well as far as his chronic lymphoid leukemia is concerned. It is gratifying that 2 years out from his transplant there is no evidence of recurrent disease. He still suffers from multiple complications of this treatment, the most acute of which currently is steroid-induced osteoporosis. We really cannot stop his steroids at this point. 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 Accordingly I am going to start denosumab on a monthly basis for the next 6 months. After that we should be able to start Prolia for maintenance.  Of course at [place] is continuing his rehabilitation exercises. We are going to continue the magnesium supplementation. 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 We will consider dropping the tacrolimus more slowly after normalization of symptoms. He is set up for IVIG 03/16/2015. He will receive weekly magnesium infusions and will see [name] on an every two-week basis. He knows to call for any problems that may develop before her next visit here.  Chauncey Cruel, MD Patient seen 03/15/2014 Dictated   03/18/2014

## 2014-03-21 ENCOUNTER — Telehealth: Payer: Self-pay | Admitting: *Deleted

## 2014-03-21 MED ORDER — AZITHROMYCIN 250 MG PO TABS: ORAL_TABLET | ORAL | Status: DC

## 2014-03-21 NOTE — Addendum Note (Signed)
Addended by: Lannette Donath E on: 03/21/2014 02:10 PM   Modules accepted: Orders

## 2014-03-21 NOTE — Telephone Encounter (Signed)
Patient calling in complaining of post nasal drainage, has had some yellow nasal drainage off and on for a few days. Negative for fever or chills. Sinuses feel "stopped up" and has been sneezing. Unable to take majority of OTC due to intolerance of Benadryl in seasonal meds. Has been taking his Claritan daily. Wonders if he should be started on Antibiotics. Will discuss with Dr Jana Hakim and contact patient.

## 2014-03-22 ENCOUNTER — Other Ambulatory Visit: Payer: Self-pay | Admitting: Physician Assistant

## 2014-03-22 ENCOUNTER — Other Ambulatory Visit: Payer: Self-pay

## 2014-03-22 DIAGNOSIS — D809 Immunodeficiency with predominantly antibody defects, unspecified: Secondary | ICD-10-CM

## 2014-03-22 DIAGNOSIS — C9110 Chronic lymphocytic leukemia of B-cell type not having achieved remission: Secondary | ICD-10-CM

## 2014-03-23 ENCOUNTER — Ambulatory Visit (HOSPITAL_BASED_OUTPATIENT_CLINIC_OR_DEPARTMENT_OTHER): Payer: BC Managed Care – PPO

## 2014-03-23 ENCOUNTER — Other Ambulatory Visit (HOSPITAL_BASED_OUTPATIENT_CLINIC_OR_DEPARTMENT_OTHER): Payer: BC Managed Care – PPO

## 2014-03-23 DIAGNOSIS — D809 Immunodeficiency with predominantly antibody defects, unspecified: Secondary | ICD-10-CM

## 2014-03-23 DIAGNOSIS — C9110 Chronic lymphocytic leukemia of B-cell type not having achieved remission: Secondary | ICD-10-CM

## 2014-03-23 DIAGNOSIS — C9111 Chronic lymphocytic leukemia of B-cell type in remission: Secondary | ICD-10-CM

## 2014-03-23 LAB — CBC WITH DIFFERENTIAL/PLATELET
BASO%: 0.5 % (ref 0.0–2.0)
BASOS ABS: 0 10*3/uL (ref 0.0–0.1)
EOS%: 0.6 % (ref 0.0–7.0)
Eosinophils Absolute: 0 10*3/uL (ref 0.0–0.5)
HCT: 38.1 % — ABNORMAL LOW (ref 38.4–49.9)
HEMOGLOBIN: 12.4 g/dL — AB (ref 13.0–17.1)
LYMPH%: 36 % (ref 14.0–49.0)
MCH: 30.8 pg (ref 27.2–33.4)
MCHC: 32.6 g/dL (ref 32.0–36.0)
MCV: 94.3 fL (ref 79.3–98.0)
MONO#: 0.6 10*3/uL (ref 0.1–0.9)
MONO%: 9.5 % (ref 0.0–14.0)
NEUT#: 3.6 10*3/uL (ref 1.5–6.5)
NEUT%: 53.4 % (ref 39.0–75.0)
Platelets: 180 10*3/uL (ref 140–400)
RBC: 4.04 10*6/uL — AB (ref 4.20–5.82)
RDW: 15.7 % — AB (ref 11.0–14.6)
WBC: 6.8 10*3/uL (ref 4.0–10.3)
lymph#: 2.4 10*3/uL (ref 0.9–3.3)

## 2014-03-23 LAB — COMPREHENSIVE METABOLIC PANEL (CC13)
ALK PHOS: 68 U/L (ref 40–150)
ALT: 34 U/L (ref 0–55)
AST: 28 U/L (ref 5–34)
Albumin: 3.6 g/dL (ref 3.5–5.0)
Anion Gap: 7 mEq/L (ref 3–11)
BUN: 40.8 mg/dL — ABNORMAL HIGH (ref 7.0–26.0)
CO2: 17 mEq/L — ABNORMAL LOW (ref 22–29)
Calcium: 9.1 mg/dL (ref 8.4–10.4)
Chloride: 113 mEq/L — ABNORMAL HIGH (ref 98–109)
Creatinine: 1.2 mg/dL (ref 0.7–1.3)
GLUCOSE: 155 mg/dL — AB (ref 70–140)
Potassium: 5.2 mEq/L — ABNORMAL HIGH (ref 3.5–5.1)
SODIUM: 137 meq/L (ref 136–145)
TOTAL PROTEIN: 6.5 g/dL (ref 6.4–8.3)
Total Bilirubin: 0.26 mg/dL (ref 0.20–1.20)

## 2014-03-23 LAB — MAGNESIUM (CC13): Magnesium: 1.7 mg/dl (ref 1.5–2.5)

## 2014-03-23 MED ORDER — SODIUM CHLORIDE 0.9 % IV SOLN
Freq: Once | INTRAVENOUS | Status: AC
  Administered 2014-03-23: 14:00:00 via INTRAVENOUS
  Filled 2014-03-23: qty 500

## 2014-03-24 LAB — IGG, IGA, IGM
IGG (IMMUNOGLOBIN G), SERUM: 964 mg/dL (ref 650–1600)
IgM, Serum: <5 mg/dL — ABNORMAL LOW (ref 41–251)

## 2014-03-29 ENCOUNTER — Other Ambulatory Visit: Payer: Self-pay | Admitting: *Deleted

## 2014-03-29 ENCOUNTER — Other Ambulatory Visit: Payer: Self-pay

## 2014-03-29 DIAGNOSIS — C9110 Chronic lymphocytic leukemia of B-cell type not having achieved remission: Secondary | ICD-10-CM

## 2014-03-29 NOTE — Progress Notes (Signed)
Patient requesting PAC placement.

## 2014-03-30 ENCOUNTER — Other Ambulatory Visit (HOSPITAL_BASED_OUTPATIENT_CLINIC_OR_DEPARTMENT_OTHER): Payer: BC Managed Care – PPO

## 2014-03-30 ENCOUNTER — Encounter (HOSPITAL_COMMUNITY): Payer: Self-pay | Admitting: Pharmacy Technician

## 2014-03-30 ENCOUNTER — Ambulatory Visit (HOSPITAL_BASED_OUTPATIENT_CLINIC_OR_DEPARTMENT_OTHER): Payer: BC Managed Care – PPO

## 2014-03-30 ENCOUNTER — Other Ambulatory Visit: Payer: Self-pay | Admitting: *Deleted

## 2014-03-30 DIAGNOSIS — M818 Other osteoporosis without current pathological fracture: Secondary | ICD-10-CM

## 2014-03-30 DIAGNOSIS — C9111 Chronic lymphocytic leukemia of B-cell type in remission: Secondary | ICD-10-CM

## 2014-03-30 DIAGNOSIS — C9110 Chronic lymphocytic leukemia of B-cell type not having achieved remission: Secondary | ICD-10-CM

## 2014-03-30 LAB — CBC WITH DIFFERENTIAL/PLATELET
BASO%: 0.4 % (ref 0.0–2.0)
Basophils Absolute: 0 10*3/uL (ref 0.0–0.1)
EOS ABS: 0.1 10*3/uL (ref 0.0–0.5)
EOS%: 0.7 % (ref 0.0–7.0)
HEMATOCRIT: 39.2 % (ref 38.4–49.9)
HGB: 12.8 g/dL — ABNORMAL LOW (ref 13.0–17.1)
LYMPH%: 34.2 % (ref 14.0–49.0)
MCH: 30.4 pg (ref 27.2–33.4)
MCHC: 32.6 g/dL (ref 32.0–36.0)
MCV: 93.3 fL (ref 79.3–98.0)
MONO#: 0.7 10*3/uL (ref 0.1–0.9)
MONO%: 9.6 % (ref 0.0–14.0)
NEUT%: 55.1 % (ref 39.0–75.0)
NEUTROS ABS: 4.2 10*3/uL (ref 1.5–6.5)
Platelets: 146 10*3/uL (ref 140–400)
RBC: 4.2 10*6/uL (ref 4.20–5.82)
RDW: 15.7 % — ABNORMAL HIGH (ref 11.0–14.6)
WBC: 7.6 10*3/uL (ref 4.0–10.3)
lymph#: 2.6 10*3/uL (ref 0.9–3.3)

## 2014-03-30 LAB — COMPREHENSIVE METABOLIC PANEL (CC13)
ALBUMIN: 3.7 g/dL (ref 3.5–5.0)
ALT: 34 U/L (ref 0–55)
ANION GAP: 7 meq/L (ref 3–11)
AST: 30 U/L (ref 5–34)
Alkaline Phosphatase: 68 U/L (ref 40–150)
BUN: 41.6 mg/dL — AB (ref 7.0–26.0)
CALCIUM: 9.6 mg/dL (ref 8.4–10.4)
CHLORIDE: 112 meq/L — AB (ref 98–109)
CO2: 19 meq/L — AB (ref 22–29)
CREATININE: 1.1 mg/dL (ref 0.7–1.3)
Glucose: 95 mg/dl (ref 70–140)
Potassium: 5.4 mEq/L — ABNORMAL HIGH (ref 3.5–5.1)
Sodium: 138 mEq/L (ref 136–145)
Total Bilirubin: 0.25 mg/dL (ref 0.20–1.20)
Total Protein: 6.5 g/dL (ref 6.4–8.3)

## 2014-03-30 LAB — MAGNESIUM (CC13): Magnesium: 1.8 mg/dl (ref 1.5–2.5)

## 2014-03-30 MED ORDER — SODIUM CHLORIDE 0.9 % IV SOLN
Freq: Once | INTRAVENOUS | Status: AC
  Administered 2014-03-30: 14:00:00 via INTRAVENOUS
  Filled 2014-03-30: qty 500

## 2014-03-30 MED ORDER — DENOSUMAB 120 MG/1.7ML ~~LOC~~ SOLN
120.0000 mg | Freq: Once | SUBCUTANEOUS | Status: DC
  Filled 2014-03-30: qty 1.7

## 2014-03-30 MED ORDER — DENOSUMAB 60 MG/ML ~~LOC~~ SOLN
60.0000 mg | Freq: Once | SUBCUTANEOUS | Status: AC
  Administered 2014-03-30: 60 mg via SUBCUTANEOUS
  Filled 2014-03-30: qty 1

## 2014-03-31 ENCOUNTER — Telehealth: Payer: Self-pay | Admitting: *Deleted

## 2014-03-31 ENCOUNTER — Encounter (HOSPITAL_COMMUNITY): Payer: Self-pay | Admitting: Pharmacy Technician

## 2014-03-31 ENCOUNTER — Other Ambulatory Visit: Payer: Self-pay | Admitting: Radiology

## 2014-03-31 NOTE — Telephone Encounter (Signed)
Patient called and moved his appts on 4/16 to later in the day

## 2014-04-03 ENCOUNTER — Inpatient Hospital Stay (HOSPITAL_COMMUNITY): Admission: RE | Admit: 2014-04-03 | Payer: Self-pay | Source: Ambulatory Visit

## 2014-04-03 ENCOUNTER — Ambulatory Visit (HOSPITAL_COMMUNITY): Admission: RE | Admit: 2014-04-03 | Payer: BC Managed Care – PPO | Source: Ambulatory Visit

## 2014-04-03 ENCOUNTER — Other Ambulatory Visit: Payer: Self-pay | Admitting: Radiology

## 2014-04-06 ENCOUNTER — Ambulatory Visit (HOSPITAL_BASED_OUTPATIENT_CLINIC_OR_DEPARTMENT_OTHER): Payer: BC Managed Care – PPO

## 2014-04-06 ENCOUNTER — Ambulatory Visit (HOSPITAL_COMMUNITY)
Admission: RE | Admit: 2014-04-06 | Discharge: 2014-04-06 | Disposition: A | Discharge status: Home / Self Care | Payer: BC Managed Care – PPO | Source: Ambulatory Visit | Attending: Oncology | Admitting: Oncology

## 2014-04-06 ENCOUNTER — Other Ambulatory Visit: Payer: Self-pay | Admitting: Oncology

## 2014-04-06 ENCOUNTER — Encounter (HOSPITAL_COMMUNITY): Payer: Self-pay

## 2014-04-06 ENCOUNTER — Other Ambulatory Visit (HOSPITAL_BASED_OUTPATIENT_CLINIC_OR_DEPARTMENT_OTHER): Payer: BC Managed Care – PPO

## 2014-04-06 ENCOUNTER — Other Ambulatory Visit: Payer: Self-pay | Admitting: Physician Assistant

## 2014-04-06 DIAGNOSIS — C9110 Chronic lymphocytic leukemia of B-cell type not having achieved remission: Secondary | ICD-10-CM

## 2014-04-06 DIAGNOSIS — Z79899 Other long term (current) drug therapy: Secondary | ICD-10-CM | POA: Insufficient documentation

## 2014-04-06 DIAGNOSIS — Z95828 Presence of other vascular implants and grafts: Secondary | ICD-10-CM

## 2014-04-06 DIAGNOSIS — Z794 Long term (current) use of insulin: Secondary | ICD-10-CM | POA: Insufficient documentation

## 2014-04-06 DIAGNOSIS — Z9484 Stem cells transplant status: Secondary | ICD-10-CM | POA: Insufficient documentation

## 2014-04-06 DIAGNOSIS — I129 Hypertensive chronic kidney disease with stage 1 through stage 4 chronic kidney disease, or unspecified chronic kidney disease: Secondary | ICD-10-CM | POA: Insufficient documentation

## 2014-04-06 DIAGNOSIS — N182 Chronic kidney disease, stage 2 (mild): Secondary | ICD-10-CM | POA: Insufficient documentation

## 2014-04-06 DIAGNOSIS — E139 Other specified diabetes mellitus without complications: Secondary | ICD-10-CM | POA: Insufficient documentation

## 2014-04-06 DIAGNOSIS — E669 Obesity, unspecified: Secondary | ICD-10-CM | POA: Insufficient documentation

## 2014-04-06 DIAGNOSIS — T380X5A Adverse effect of glucocorticoids and synthetic analogues, initial encounter: Secondary | ICD-10-CM | POA: Insufficient documentation

## 2014-04-06 DIAGNOSIS — E785 Hyperlipidemia, unspecified: Secondary | ICD-10-CM | POA: Insufficient documentation

## 2014-04-06 LAB — CBC WITH DIFFERENTIAL/PLATELET
BASO%: 0.2 % (ref 0.0–2.0)
Basophils Absolute: 0 10*3/uL (ref 0.0–0.1)
EOS%: 0.6 % (ref 0.0–7.0)
Eosinophils Absolute: 0 10*3/uL (ref 0.0–0.5)
HCT: 39.2 % (ref 38.4–49.9)
HGB: 12.6 g/dL — ABNORMAL LOW (ref 13.0–17.1)
LYMPH#: 2.8 10*3/uL (ref 0.9–3.3)
LYMPH%: 43.7 % (ref 14.0–49.0)
MCH: 30.4 pg (ref 27.2–33.4)
MCHC: 32.1 g/dL (ref 32.0–36.0)
MCV: 94.5 fL (ref 79.3–98.0)
MONO#: 0.4 10*3/uL (ref 0.1–0.9)
MONO%: 6.3 % (ref 0.0–14.0)
NEUT%: 49.2 % (ref 39.0–75.0)
NEUTROS ABS: 3.1 10*3/uL (ref 1.5–6.5)
PLATELETS: 132 10*3/uL — AB (ref 140–400)
RBC: 4.15 10*6/uL — AB (ref 4.20–5.82)
RDW: 15.7 % — AB (ref 11.0–14.6)
WBC: 6.3 10*3/uL (ref 4.0–10.3)

## 2014-04-06 LAB — COMPREHENSIVE METABOLIC PANEL (CC13)
ALBUMIN: 3.4 g/dL — AB (ref 3.5–5.0)
ALT: 27 U/L (ref 0–55)
ANION GAP: 8 meq/L (ref 3–11)
AST: 21 U/L (ref 5–34)
Alkaline Phosphatase: 55 U/L (ref 40–150)
BUN: 46.8 mg/dL — ABNORMAL HIGH (ref 7.0–26.0)
CO2: 17 mEq/L — ABNORMAL LOW (ref 22–29)
Calcium: 8.1 mg/dL — ABNORMAL LOW (ref 8.4–10.4)
Chloride: 119 mEq/L — ABNORMAL HIGH (ref 98–109)
Creatinine: 1.2 mg/dL (ref 0.7–1.3)
Glucose: 165 mg/dl — ABNORMAL HIGH (ref 70–140)
POTASSIUM: 4.2 meq/L (ref 3.5–5.1)
Sodium: 144 mEq/L (ref 136–145)
TOTAL PROTEIN: 5.9 g/dL — AB (ref 6.4–8.3)

## 2014-04-06 LAB — MAGNESIUM (CC13): Magnesium: 2 mg/dl (ref 1.5–2.5)

## 2014-04-06 LAB — CBC
HCT: 38.5 % — ABNORMAL LOW (ref 39.0–52.0)
HEMOGLOBIN: 12.2 g/dL — AB (ref 13.0–17.0)
MCH: 30 pg (ref 26.0–34.0)
MCHC: 31.7 g/dL (ref 30.0–36.0)
MCV: 94.8 fL (ref 78.0–100.0)
PLATELETS: 144 10*3/uL — AB (ref 150–400)
RBC: 4.06 MIL/uL — AB (ref 4.22–5.81)
RDW: 15.7 % — ABNORMAL HIGH (ref 11.5–15.5)
WBC: 7.1 10*3/uL (ref 4.0–10.5)

## 2014-04-06 LAB — GLUCOSE, CAPILLARY: GLUCOSE-CAPILLARY: 112 mg/dL — AB (ref 70–99)

## 2014-04-06 LAB — APTT: APTT: 29 s (ref 24–37)

## 2014-04-06 LAB — PROTIME-INR
INR: 1 (ref 0.00–1.49)
PROTHROMBIN TIME: 13 s (ref 11.6–15.2)

## 2014-04-06 MED ORDER — FENTANYL CITRATE 0.05 MG/ML IJ SOLN
INTRAMUSCULAR | Status: AC
  Filled 2014-04-06: qty 6

## 2014-04-06 MED ORDER — FENTANYL CITRATE 0.05 MG/ML IJ SOLN
INTRAMUSCULAR | Status: AC | PRN
  Administered 2014-04-06 (×3): 25 ug via INTRAVENOUS

## 2014-04-06 MED ORDER — CEFAZOLIN SODIUM-DEXTROSE 2-3 GM-% IV SOLR
INTRAVENOUS | Status: AC
  Administered 2014-04-06: 2 g via INTRAVENOUS
  Filled 2014-04-06: qty 50

## 2014-04-06 MED ORDER — SODIUM CHLORIDE 0.9 % IV SOLN
Freq: Once | INTRAVENOUS | Status: AC
Start: 2014-04-06 — End: 2014-04-06
  Administered 2014-04-06: 16:00:00 via INTRAVENOUS

## 2014-04-06 MED ORDER — HEPARIN SOD (PORK) LOCK FLUSH 100 UNIT/ML IV SOLN
INTRAVENOUS | Status: AC
  Filled 2014-04-06: qty 5

## 2014-04-06 MED ORDER — MIDAZOLAM HCL 2 MG/2ML IJ SOLN
INTRAMUSCULAR | Status: AC
  Filled 2014-04-06: qty 6

## 2014-04-06 MED ORDER — SODIUM CHLORIDE 0.9 % IJ SOLN
10.0000 mL | Freq: Once | INTRAMUSCULAR | Status: AC
  Administered 2014-04-06: 10 mL
  Filled 2014-04-06: qty 10

## 2014-04-06 MED ORDER — MIDAZOLAM HCL 2 MG/2ML IJ SOLN
INTRAMUSCULAR | Status: AC | PRN
  Administered 2014-04-06 (×3): 1 mg via INTRAVENOUS

## 2014-04-06 MED ORDER — LIDOCAINE-EPINEPHRINE (PF) 2 %-1:200000 IJ SOLN
INTRAMUSCULAR | Status: AC
  Filled 2014-04-06: qty 20

## 2014-04-06 MED ORDER — LIDOCAINE HCL 1 % IJ SOLN
INTRAMUSCULAR | Status: AC
  Filled 2014-04-06: qty 20

## 2014-04-06 MED ORDER — SODIUM CHLORIDE 0.9 % IV SOLN
Freq: Once | INTRAVENOUS | Status: AC
  Administered 2014-04-06: 16:00:00 via INTRAVENOUS
  Filled 2014-04-06: qty 500

## 2014-04-06 MED ORDER — CEFAZOLIN SODIUM-DEXTROSE 2-3 GM-% IV SOLR
2.0000 g | Freq: Once | INTRAVENOUS | Status: AC
  Administered 2014-04-06: 2 g via INTRAVENOUS

## 2014-04-06 MED ORDER — LIDOCAINE-PRILOCAINE 2.5-2.5 % EX CREA: 1.0000 "application " | TOPICAL_CREAM | CUTANEOUS | Status: DC | PRN

## 2014-04-06 MED ORDER — HEPARIN SOD (PORK) LOCK FLUSH 100 UNIT/ML IV SOLN
500.0000 [IU] | Freq: Once | INTRAVENOUS | Status: AC
  Administered 2014-04-06: 500 [IU]
  Filled 2014-04-06: qty 5

## 2014-04-06 MED ORDER — SODIUM CHLORIDE 0.9 % IV SOLN
INTRAVENOUS | Status: DC
  Administered 2014-04-06: 20 mL/h via INTRAVENOUS

## 2014-04-06 MED ORDER — HEPARIN SOD (PORK) LOCK FLUSH 100 UNIT/ML IV SOLN
INTRAVENOUS | Status: DC | PRN
  Administered 2014-04-06: 500 [IU]

## 2014-04-06 NOTE — Patient Instructions (Signed)
Dehydration, Adult Dehydration is when you lose more fluids from the body than you take in. Vital organs like the kidneys, brain, and heart cannot function without a proper amount of fluids and salt. Any loss of fluids from the body can cause dehydration.  CAUSES   Vomiting.  Diarrhea.  Excessive sweating.  Excessive urine output.  Fever. SYMPTOMS  Mild dehydration  Thirst.  Dry lips.  Slightly dry mouth. Moderate dehydration  Very dry mouth.  Sunken eyes.  Skin does not bounce back quickly when lightly pinched and released.  Dark urine and decreased urine production.  Decreased tear production.  Headache. Severe dehydration  Very dry mouth.  Extreme thirst.  Rapid, weak pulse (more than 100 beats per minute at rest).  Cold hands and feet.  Not able to sweat in spite of heat and temperature.  Rapid breathing.  Blue lips.  Confusion and lethargy.  Difficulty being awakened.  Minimal urine production.  No tears. DIAGNOSIS  Your caregiver will diagnose dehydration based on your symptoms and your exam. Blood and urine tests will help confirm the diagnosis. The diagnostic evaluation should also identify the cause of dehydration. TREATMENT  Treatment of mild or moderate dehydration can often be done at home by increasing the amount of fluids that you drink. It is best to drink small amounts of fluid more often. Drinking too much at one time can make vomiting worse. Refer to the home care instructions below. Severe dehydration needs to be treated at the hospital where you will probably be given intravenous (IV) fluids that contain water and electrolytes. HOME CARE INSTRUCTIONS   Ask your caregiver about specific rehydration instructions.  Drink enough fluids to keep your urine clear or pale yellow.  Drink small amounts frequently if you have nausea and vomiting.  Eat as you normally do.  Avoid:  Foods or drinks high in sugar.  Carbonated  drinks.  Juice.  Extremely hot or cold fluids.  Drinks with caffeine.  Fatty, greasy foods.  Alcohol.  Tobacco.  Overeating.  Gelatin desserts.  Wash your hands well to avoid spreading bacteria and viruses.  Only take over-the-counter or prescription medicines for pain, discomfort, or fever as directed by your caregiver.  Ask your caregiver if you should continue all prescribed and over-the-counter medicines.  Keep all follow-up appointments with your caregiver. SEEK MEDICAL CARE IF:  You have abdominal pain and it increases or stays in one area (localizes).  You have a rash, stiff neck, or severe headache.  You are irritable, sleepy, or difficult to awaken.  You are weak, dizzy, or extremely thirsty. SEEK IMMEDIATE MEDICAL CARE IF:   You are unable to keep fluids down or you get worse despite treatment.  You have frequent episodes of vomiting or diarrhea.  You have blood or green matter (bile) in your vomit.  You have blood in your stool or your stool looks black and tarry.  You have not urinated in 6 to 8 hours, or you have only urinated a small amount of very dark urine.  You have a fever.  You faint. MAKE SURE YOU:   Understand these instructions.  Will watch your condition.  Will get help right away if you are not doing well or get worse. Document Released: 12/08/2005 Document Revised: 03/01/2012 Document Reviewed: 07/28/2011 Exit[employer] Patient Information 2014 [employer].  

## 2014-04-06 NOTE — Procedures (Signed)
Interventional Radiology Procedure Note  Procedure: Placement of a right IJ approach single lumen PowerPort.  Tip is positioned at the superior cavoatrial junction and catheter is ready for immediate use.  Complications: No immediate Recommendations:  - Ok to shower tomorrow - Do not submerge for 7 days - Routine line care   Signed,  Diezel Mazur K. Sarabi Sockwell, MD [facility]  Radiology   

## 2014-04-06 NOTE — Discharge Instructions (Signed)
Moderate Sedation, Adult °Moderate sedation is given to help you relax or even sleep through a procedure. You may remain sleepy, be clumsy, or have poor balance for several hours following this procedure. Arrange for a responsible adult, family member, or friend to take you home. A responsible adult should stay with you for at least 24 hours or until the medicines have worn off. °· Do not participate in any activities where you could become injured for the next 24 hours, or until you feel normal again. Do not: °· Drive. °· Swim. °· Ride a bicycle. °· Operate heavy machinery. °· Cook. °· Use power tools. °· Climb ladders. °· Work at heights. °· Do not make important decisions or sign legal documents until you are improved. °· Vomiting may occur if you eat too soon. When you can drink without vomiting, try water, juice, or soup. Try solid foods if you feel little or no nausea. °· Only take over-the-counter or prescription medications for pain, discomfort, or fever as directed by your caregiver.If pain medications have been prescribed for you, ask your caregiver how soon it is safe to take them. °· Make sure you and your family fully understands everything about the medication given to you. Make sure you understand what side effects may occur. °· You should not drink alcohol, take sleeping pills, or medications that cause drowsiness for at least 24 hours. °· If you smoke, do not smoke alone. °· If you are feeling better, you may resume normal activities 24 hours after receiving sedation. °· Keep all appointments as scheduled. Follow all instructions. °· Ask questions if you do not understand. °SEEK MEDICAL CARE IF:  °· Your skin is pale or bluish in color. °· You continue to feel sick to your stomach (nauseous) or throw up (vomit). °· Your pain is getting worse and not helped by medication. °· You have bleeding or swelling. °· You are still sleepy or feeling clumsy after 24 hours. °SEEK IMMEDIATE MEDICAL CARE IF:   °· You develop a rash. °· You have difficulty breathing. °· You develop any type of allergic problem. °· You have a fever. °Document Released: 09/02/2001 Document Revised: 03/01/2012 Document Reviewed: 08/15/2013 °ExitCare® Patient Information ©2014 [employer]. °Implanted Port Home Guide °An implanted port is a type of central line that is placed under the skin. Central lines are used to provide IV access when treatment or nutrition needs to be given through a person's veins. Implanted ports are used for long-term IV access. An implanted port may be placed because:  °· You need IV medicine that would be irritating to the small veins in your hands or arms.   °· You need long-term IV medicines, such as antibiotics.   °· You need IV nutrition for a long period.   °· You need frequent blood draws for lab tests.   °· You need dialysis.   °Implanted ports are usually placed in the chest area, but they can also be placed in the upper arm, the abdomen, or the leg. An implanted port has two main parts:  °· Reservoir. The reservoir is round and will appear as a small, raised area under your skin. The reservoir is the part where a needle is inserted to give medicines or draw blood.   °· Catheter. The catheter is a thin, flexible tube that extends from the reservoir. The catheter is placed into a large vein. Medicine that is inserted into the reservoir goes into the catheter and then into the vein.   °HOW WILL I CARE FOR MY   INCISION SITE? °Do not get the incision site wet. Bathe or shower as directed by your health care provider.  °HOW IS MY PORT ACCESSED? °Special steps must be taken to access the port:  °· Before the port is accessed, a numbing cream can be placed on the skin. This helps numb the skin over the port site.   °· Your health care provider uses a sterile technique to access the port. °· Your health care provider must put on a mask and sterile gloves. °· The skin over your port is cleaned carefully with an  antiseptic and allowed to dry. °· The port is gently pinched between sterile gloves, and a needle is inserted into the port. °· Only "non-coring" port needles should be used to access the port. Once the port is accessed, a blood return should be checked. This helps ensure that the port is in the vein and is not clogged.   °· If your port needs to remain accessed for a constant infusion, a clear (transparent) bandage will be placed over the needle site. The bandage and needle will need to be changed every week, or as directed by your health care provider.   °· Keep the bandage covering the needle clean and dry. Do not get it wet. Follow your health care provider's instructions on how to take a shower or bath while the port is accessed.   °· If your port does not need to stay accessed, no bandage is needed over the port.   °WHAT IS FLUSHING? °Flushing helps keep the port from getting clogged. Follow your health care provider's instructions on how and when to flush the port. Ports are usually flushed with saline solution or a medicine called heparin. The need for flushing will depend on how the port is used.  °· If the port is used for intermittent medicines or blood draws, the port will need to be flushed:   °· After medicines have been given.   °· After blood has been drawn.   °· As part of routine maintenance.   °· If a constant infusion is running, the port may not need to be flushed.   °HOW LONG WILL MY PORT STAY IMPLANTED? °The port can stay in for as long as your health care provider thinks it is needed. When it is time for the port to come out, surgery will be done to remove it. The procedure is similar to the one performed when the port was put in.  °WHEN SHOULD I SEEK IMMEDIATE MEDICAL CARE? °When you have an implanted port, you should seek immediate medical care if:  °· You notice a bad smell coming from the incision site.   °· You have swelling, redness, or drainage at the incision site.   °· You have more  swelling or pain at the port site or the surrounding area.   °· You have a fever that is not controlled with medicine. °Document Released: 12/08/2005 Document Revised: 09/28/2013 Document Reviewed: 08/15/2013 °ExitCare® Patient Information ©2014 [employer]. ° °

## 2014-04-06 NOTE — H&P (Signed)
Chief Complaint: "I'm here for a port" Referring Physician:Magrinat HPI: Timothy [name] is an 68 y.o. male with hx of chronic lymphocytic leukemia. He has had prior stem cell transplant in 2013. He continues to need weekly magnesium infusions and occasional IV immunoglobulin as well. He previously had a Hickman catheter that lasted 2 years but was removed about a month ago. He has difficult peripheral IV access and is now scheduled for portacath placement. PMHx, meds reviewed.  Past Medical History:  Past Medical History  Diagnosis Date  . 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 Diverticular disease   . Hyperlipidemia   . Obesity   . Hypertension   . Hiatal hernia   . CMV (cytomegalovirus) antibody positive     pre-transplant, with seroconversion x2 pst-transplant  . Right bundle branch block     pre-transplant  . CKD (chronic kidney disease) stage 2, GFR 60-89 ml/min   . Pancytopenia   . Steroid-induced diabetes   . Atrial fibrillation     post-transplant  . Myopathy   . 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 Leukemia, chronic lymphoid   . 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 Diarrhea in adult patient 12/05/2012    Due to active GVHD  . CLL (chronic lymphocytic leukemia) 12/05/2012    Dx 07/1999; started Rx 12/06  AlloBMT 3/13  . Rash of face 12/05/2012    Due to GVHD  . Hypomagnesemia 01/26/2013  . Left hip pain 12/01/2013    Past Surgical History:  Past Surgical History  Procedure Laterality Date  . Tonsillectomy and adenoidectomy    . Bone marrow transplant    . Flexible sigmoidoscopy  11/17/2012    Procedure: FLEXIBLE SIGMOIDOSCOPY;  Surgeon: Jeryl Columbia, MD;  Location: [facility];  Service: Endoscopy;  Laterality: N/A;  Dr Watt Climes states will be admitted to rooom [id number] 11/16/12  . Esophagogastroduodenoscopy  11/17/2012    Procedure:  ESOPHAGOGASTRODUODENOSCOPY (EGD);  Surgeon: Jeryl Columbia, MD;  Location: [facility];  Service: Endoscopy;  Laterality: N/A;    Family History:  Family History  Problem Relation Age of Onset  . Cancer Father     Social History:  reports that he has never smoked. He has never used smokeless tobacco. He reports that he does not drink alcohol or use illicit drugs.  Allergies:  Allergies  Allergen Reactions  . Benadryl [Diphenhydramine Hcl]     "Restless leg syndrome"    Medications:   Medication List    ASK your doctor about these medications       acyclovir 400 MG tablet  Commonly known as:  ZOVIRAX  Take 2 tablets (800 mg total) by mouth 2 (two) times daily.     B-D UF III MINI PEN NEEDLES 31G X 5 MM Misc  Generic drug:  Insulin Pen Needle     budesonide 3 MG 24 hr capsule  Commonly known as:  ENTOCORT EC  TAKE 1 CAPSULE BY MOUTH 3 TIMES DAILY     cholecalciferol 1000 UNITS tablet  Commonly known as:  VITAMIN D  Take 1,000 Units by mouth every evening.     diltiazem 240 MG 24 hr capsule  Commonly known as:  DILACOR XR  Take 240 mg by mouth every evening.     ferrous sulfate 325 (65 FE) MG tablet  Take 325 mg by mouth daily with breakfast.     fluconazole 100  MG tablet  Commonly known as:  DIFLUCAN  Take 1 tablet (100 mg total) by mouth daily.     furosemide 20 MG tablet  Commonly known as:  LASIX  TAKE 1 TABLET BY MOUTH EVERY MORNING     glucose blood test strip  Commonly known as:  ONE TOUCH ULTRA TEST  Test before meals and at bedtime.     HYDROcodone-acetaminophen 10-325 MG per tablet  Commonly known as:  NORCO  Take 1-2 tablets by mouth every 6 (six) hours as needed for moderate pain.     Insulin Aspart Prot & Aspart (70-30) 100 UNIT/ML Pen  Commonly known as:  NOVOLOG 70/30 MIX  Inject 10 Units into the skin daily with breakfast.     labetalol 200 MG tablet  Commonly known as:  NORMODYNE  Take 400 mg by mouth 2 (two) times daily.      Lidocaine-Hydrocortisone Ace 3-0.5 % Kit  Apply 1 application topically as needed (for pain).     lisinopril 10 MG tablet  Commonly known as:  PRINIVIL,ZESTRIL  Take 10 mg by mouth every evening.     loratadine 10 MG tablet  Commonly known as:  CLARITIN  Take 10 mg by mouth daily.     multivitamin with minerals Tabs tablet  Take 1 tablet by mouth daily.     omeprazole 20 MG capsule  Commonly known as:  PRILOSEC  Take 20 mg by mouth daily.     ONETOUCH DELICA LANCETS 62V Misc  Test before meals and at bedtime.     predniSONE 5 MG tablet  Commonly known as:  DELTASONE  Take 2.5-5 mg by mouth 2 (two) times daily. Takes $RemoveBefo'5mg'BeWQeISRAJj$  in the morning and 2.$RemoveBefor'5mg'vOuyVlbEVOBY$  in the evening.     sertraline 50 MG tablet  Commonly known as:  ZOLOFT  ALTERNATE TAKING 1 TABLET DAILY AND 2 TABLETS DAILY     sulfamethoxazole-trimethoprim 800-160 MG per tablet  Commonly known as:  BACTRIM DS  Take 1 tablet by mouth daily.     tacrolimus 0.5 MG capsule  Commonly known as:  PROGRAF  TAKE 3 CAPSULES BY MOUTH TWO TIMES DAILY     vancomycin 125 MG capsule  Commonly known as:  VANCOCIN  Take 1 capsule (125 mg total) by mouth 4 (four) times daily.     VOLTAREN 1 % Gel  Generic drug:  diclofenac sodium  Apply 2 g topically 2 (two) times daily. Applied to back     zinc sulfate 220 MG capsule  Take 220 mg by mouth daily.        Please HPI for pertinent positives, otherwise complete 10 system ROS negative.  Physical Exam: BP 150/82  Pulse 67  Temp(Src) 97.9 F (36.6 C) (Oral)  Resp 18  Ht 5' 6.5" (1.689 m)  Wt 195 lb (88.451 kg)  BMI 31.01 kg/m2  SpO2 96% Body mass index is 31.01 kg/(m^2).   General Appearance:  Alert, cooperative, no distress, appears stated age  Head:  Normocephalic, without obvious abnormality, atraumatic  ENT: Unremarkable  Neck: Supple, symmetrical, trachea midline. Well healed site of Hickman cath removal, though cuff is palpable.  Lungs:   Clear to auscultation bilaterally,  no w/r/r, respirations unlabored without use of accessory muscles.  Chest Wall:  No tenderness or deformity  Heart:  Regular rate and rhythm, S1, S2 normal, no murmur, rub or gallop.  Abdomen:   Soft, non-tender, non distended.  Neurologic: Normal affect, no gross deficits.   Results for orders placed  during the hospital encounter of 04/06/14 (from the past 48 hour(s))  APTT     Status: None   Collection Time    04/06/14  9:49 AM      Result Value Ref Range   aPTT 29  24 - 37 seconds  CBC     Status: Abnormal   Collection Time    04/06/14  9:49 AM      Result Value Ref Range   WBC 7.1  4.0 - 10.5 K/uL   RBC 4.06 (*) 4.22 - 5.81 MIL/uL   Hemoglobin 12.2 (*) 13.0 - 17.0 g/dL   HCT 38.5 (*) 39.0 - 52.0 %   MCV 94.8  78.0 - 100.0 fL   MCH 30.0  26.0 - 34.0 pg   MCHC 31.7  30.0 - 36.0 g/dL   RDW 15.7 (*) 11.5 - 15.5 %   Platelets 144 (*) 150 - 400 K/uL  PROTIME-INR     Status: None   Collection Time    04/06/14  9:49 AM      Result Value Ref Range   Prothrombin Time 13.0  11.6 - 15.2 seconds   INR 1.00  0.00 - 1.49      *Note: Due to a large number of results for the requested time period, some results have not been displayed. A complete set of results can be found in Results Review.   No results found.  Assessment/Plan CLL, s/p stem cell transplant For Port placement for chronic infusion needs. Discussed procedure, risks, complications, use of sedation. Labs reviewed, ok Consent signed in chart  Ascencion Dike PA-C 04/06/2014, 11:12 AM

## 2014-04-13 ENCOUNTER — Telehealth: Payer: Self-pay | Admitting: Physician Assistant

## 2014-04-13 ENCOUNTER — Other Ambulatory Visit (HOSPITAL_BASED_OUTPATIENT_CLINIC_OR_DEPARTMENT_OTHER): Payer: BC Managed Care – PPO

## 2014-04-13 ENCOUNTER — Ambulatory Visit (HOSPITAL_BASED_OUTPATIENT_CLINIC_OR_DEPARTMENT_OTHER): Payer: BC Managed Care – PPO

## 2014-04-13 ENCOUNTER — Ambulatory Visit (HOSPITAL_BASED_OUTPATIENT_CLINIC_OR_DEPARTMENT_OTHER): Payer: BC Managed Care – PPO | Admitting: Physician Assistant

## 2014-04-13 ENCOUNTER — Encounter: Payer: Self-pay | Admitting: Physician Assistant

## 2014-04-13 VITALS — BP 136/77 | HR 76 | Temp 98.3°F | Resp 18 | Ht 66.0 in | Wt 204.9 lb

## 2014-04-13 DIAGNOSIS — D809 Immunodeficiency with predominantly antibody defects, unspecified: Secondary | ICD-10-CM

## 2014-04-13 DIAGNOSIS — E099 Drug or chemical induced diabetes mellitus without complications: Secondary | ICD-10-CM

## 2014-04-13 DIAGNOSIS — I10 Essential (primary) hypertension: Secondary | ICD-10-CM

## 2014-04-13 DIAGNOSIS — D801 Nonfamilial hypogammaglobulinemia: Secondary | ICD-10-CM

## 2014-04-13 DIAGNOSIS — D89811 Chronic graft-versus-host disease: Secondary | ICD-10-CM

## 2014-04-13 DIAGNOSIS — R5381 Other malaise: Secondary | ICD-10-CM

## 2014-04-13 DIAGNOSIS — M949 Disorder of cartilage, unspecified: Secondary | ICD-10-CM

## 2014-04-13 DIAGNOSIS — D899 Disorder involving the immune mechanism, unspecified: Secondary | ICD-10-CM

## 2014-04-13 DIAGNOSIS — G252 Other specified forms of tremor: Secondary | ICD-10-CM

## 2014-04-13 DIAGNOSIS — M899 Disorder of bone, unspecified: Secondary | ICD-10-CM

## 2014-04-13 DIAGNOSIS — C9110 Chronic lymphocytic leukemia of B-cell type not having achieved remission: Secondary | ICD-10-CM

## 2014-04-13 DIAGNOSIS — T8609 Other complications of bone marrow transplant: Secondary | ICD-10-CM

## 2014-04-13 DIAGNOSIS — M810 Age-related osteoporosis without current pathological fracture: Secondary | ICD-10-CM

## 2014-04-13 DIAGNOSIS — E279 Disorder of adrenal gland, unspecified: Secondary | ICD-10-CM

## 2014-04-13 DIAGNOSIS — T380X5A Adverse effect of glucocorticoids and synthetic analogues, initial encounter: Secondary | ICD-10-CM

## 2014-04-13 DIAGNOSIS — E860 Dehydration: Secondary | ICD-10-CM

## 2014-04-13 DIAGNOSIS — Z9489 Other transplanted organ and tissue status: Secondary | ICD-10-CM

## 2014-04-13 DIAGNOSIS — K6289 Other specified diseases of anus and rectum: Secondary | ICD-10-CM

## 2014-04-13 DIAGNOSIS — D849 Immunodeficiency, unspecified: Secondary | ICD-10-CM

## 2014-04-13 LAB — COMPREHENSIVE METABOLIC PANEL (CC13)
ALK PHOS: 68 U/L (ref 40–150)
ALT: 24 U/L (ref 0–55)
AST: 24 U/L (ref 5–34)
Albumin: 3.6 g/dL (ref 3.5–5.0)
Anion Gap: 10 mEq/L (ref 3–11)
BILIRUBIN TOTAL: 0.24 mg/dL (ref 0.20–1.20)
BUN: 42.7 mg/dL — AB (ref 7.0–26.0)
CO2: 15 mEq/L — ABNORMAL LOW (ref 22–29)
Calcium: 9.4 mg/dL (ref 8.4–10.4)
Chloride: 114 mEq/L — ABNORMAL HIGH (ref 98–109)
Creatinine: 1.3 mg/dL (ref 0.7–1.3)
GLUCOSE: 191 mg/dL — AB (ref 70–140)
Potassium: 5 mEq/L (ref 3.5–5.1)
SODIUM: 139 meq/L (ref 136–145)
Total Protein: 6.1 g/dL — ABNORMAL LOW (ref 6.4–8.3)

## 2014-04-13 LAB — CBC WITH DIFFERENTIAL/PLATELET
BASO%: 0.5 % (ref 0.0–2.0)
BASOS ABS: 0 10*3/uL (ref 0.0–0.1)
EOS%: 0.6 % (ref 0.0–7.0)
Eosinophils Absolute: 0 10*3/uL (ref 0.0–0.5)
HCT: 38.5 % (ref 38.4–49.9)
HGB: 12.6 g/dL — ABNORMAL LOW (ref 13.0–17.1)
LYMPH%: 31.7 % (ref 14.0–49.0)
MCH: 30.7 pg (ref 27.2–33.4)
MCHC: 32.6 g/dL (ref 32.0–36.0)
MCV: 94.2 fL (ref 79.3–98.0)
MONO#: 0.6 10*3/uL (ref 0.1–0.9)
MONO%: 8.5 % (ref 0.0–14.0)
NEUT#: 4.3 10*3/uL (ref 1.5–6.5)
NEUT%: 58.7 % (ref 39.0–75.0)
PLATELETS: 176 10*3/uL (ref 140–400)
RBC: 4.09 10*6/uL — AB (ref 4.20–5.82)
RDW: 15.5 % — ABNORMAL HIGH (ref 11.0–14.6)
WBC: 7.3 10*3/uL (ref 4.0–10.3)
lymph#: 2.3 10*3/uL (ref 0.9–3.3)

## 2014-04-13 LAB — MAGNESIUM (CC13): Magnesium: 1.6 mg/dl (ref 1.5–2.5)

## 2014-04-13 MED ORDER — INSULIN PEN NEEDLE 31G X 5 MM MISC: Status: DC

## 2014-04-13 MED ORDER — DILTIAZEM HCL ER 240 MG PO CP24: 240.0000 mg | ORAL_CAPSULE | Freq: Every evening | ORAL | Status: DC

## 2014-04-13 MED ORDER — ACYCLOVIR 400 MG PO TABS: 800.0000 mg | ORAL_TABLET | Freq: Two times a day (BID) | ORAL | Status: DC

## 2014-04-13 MED ORDER — OMEPRAZOLE 20 MG PO CPDR: 20.0000 mg | DELAYED_RELEASE_CAPSULE | Freq: Every day | ORAL | Status: DC

## 2014-04-13 MED ORDER — SULFAMETHOXAZOLE-TMP DS 800-160 MG PO TABS: 1.0000 | ORAL_TABLET | Freq: Every day | ORAL | Status: DC

## 2014-04-13 MED ORDER — LIDOCAINE-HYDROCORTISONE ACE 3-0.5 % RE KIT: 1.0000 "application " | PACK | RECTAL | Status: DC | PRN

## 2014-04-13 MED ORDER — TACROLIMUS 0.5 MG PO CAPS: ORAL_CAPSULE | ORAL | Status: DC

## 2014-04-13 MED ORDER — PREDNISONE 5 MG PO TABS: 2.5000 mg | ORAL_TABLET | Freq: Two times a day (BID) | ORAL | Status: DC

## 2014-04-13 MED ORDER — FUROSEMIDE 20 MG PO TABS: 20.0000 mg | ORAL_TABLET | Freq: Every day | ORAL | Status: DC

## 2014-04-13 MED ORDER — SERTRALINE HCL 50 MG PO TABS: ORAL_TABLET | ORAL | Status: DC

## 2014-04-13 MED ORDER — SODIUM CHLORIDE 0.9 % IV SOLN
Freq: Once | INTRAVENOUS | Status: AC
  Administered 2014-04-13: 16:00:00 via INTRAVENOUS
  Filled 2014-04-13: qty 500

## 2014-04-13 MED ORDER — BUDESONIDE 3 MG PO CP24: 3.0000 mg | ORAL_CAPSULE | Freq: Three times a day (TID) | ORAL | Status: DC

## 2014-04-13 NOTE — Telephone Encounter (Signed)
, °

## 2014-04-13 NOTE — Progress Notes (Signed)
ID: Timothy [name]   DOB: Mar 02, 1946  MR#: [id number]  NGE#:[id number]  MWN:UUVO, Timothy Saxon, MD SU: OTHER MD: Timothy [name]; Timothy Shin, MD; Timothy [name], Timothy Hatcher,MD; Timothy Rosenthal, MD; [facility] in [place], [place]:  Timothy Duval, RN [phone])  CHIEF COMPLAINT:  CLL, status post allogeneic stem cell transplant   HISTORY OF PRESENT ILLNESS: We have very complete records from Dr. Racheal [name] in [place], and in summary:  The patient was initially diagnosed in August 2000, with a white cell count of 23,600, but normal hemoglobin and platelets, and no significant symptomatology. Over the next several years his white cell count drifted up, and he eventually developed some symptoms of night sweats in particular, leading to treatment with fludarabine, Cytoxan and rituxan for five cycles given between December 2006 and May 2007.  We have CT scans from June 2006, November 2006 and April 2007, and comparing the November 2006 and April 2007 scans, there was near complete response. He had subsequent therapy in [place] as detailed below, but with decreased response, leading to allogeneic stem-cell transplant at the [facility] 02/24/2012.  Subsequent history is as detailed below.  INTERVAL HISTORY: Timothy [name] returns today accompanied by his wife Timothy [name] for followup of his chronic lymphoid leukemia status post allogenic transplant. He continues to do well, and tells me he really has "no complaints today". He continues to receive IV fluids with IV magnesium on a weekly basis. His labs are checked weekly. He is also receiving  denosumab on a monthly basis, with his first dose given on April 9. He will be due again for his next injection on May 7.    Interval history is notable for the fact that Timothy [name] had a port placed last week on April 16. He tolerated the procedure well. He has his Emla cream in place today, and is ready for the port to be  utilized for the first time.   REVIEW OF SYSTEMS: Timothy [name] has had no problems with  fevers, chills, or night sweats. She's had no new rashes and denies any abnormal bleeding. His energy level is fair. He admits that he is not exercising as much as he would like. His pain is currently well-controlled, although he continues to have intermittent pain in his back and hips. He is eating and drinking well denies any nausea or emesis. She's had no additional change in bowel or bladder habits. He denies any increased cough, increased shortness of breath, peripheral swelling, chest pain, or palpitations. She's had no abnormal headaches or dizziness. He continues to have a slight tremor, thought to be secondary to tacrolimus.  A detailed review of systems is otherwise stable.   PAST MEDICAL HISTORY: Past Medical History  Diagnosis Date  . 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 Diverticular disease   . Hyperlipidemia   . Obesity   . Hypertension   . Hiatal hernia   . CMV (cytomegalovirus) antibody positive     pre-transplant, with seroconversion x2 pst-transplant  . Right bundle branch block     pre-transplant  . CKD (chronic kidney disease) stage 2, GFR 60-89 ml/min   . Pancytopenia   . Steroid-induced diabetes   . Atrial fibrillation     post-transplant  . Myopathy   . 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 Leukemia, chronic lymphoid   . 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 Diarrhea in adult  patient 12/05/2012    Due to active GVHD  . CLL (chronic lymphocytic leukemia) 12/05/2012    Dx 07/1999; started Rx 12/06  AlloBMT 3/13  . Rash of face 12/05/2012    Due to GVHD  . Hypomagnesemia 01/26/2013  . Left hip pain 12/01/2013    PAST SURGICAL HISTORY: Past Surgical History  Procedure Laterality Date  . Tonsillectomy and adenoidectomy    . Bone marrow transplant    . Flexible  sigmoidoscopy  11/17/2012    Procedure: FLEXIBLE SIGMOIDOSCOPY;  Surgeon: Jeryl Columbia, MD;  Location: [facility];  Service: Endoscopy;  Laterality: N/A;  Dr Watt Climes states will be admitted to rooom 1339 11/16/12  . Esophagogastroduodenoscopy  11/17/2012    Procedure: ESOPHAGOGASTRODUODENOSCOPY (EGD);  Surgeon: Jeryl Columbia, MD;  Location: [facility];  Service: Endoscopy;  Laterality: N/A;    FAMILY HISTORY Family History  Problem Relation Age of Onset  . Cancer Father    The patient's father died from complications of chronic lymphocytic leukemia at the age of 53.  It had been diagnosed seven years before when he was 34.  The patient is enrolled in a familial chronic lymphocytic leukemia study out of the [employer].  The patient's mother is 76, alive, unfortunately suffering with dementia, and he has a brother, 70, who is otherwise in fair health.   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  His wife of >40 years, Timothy [name], is a homemaker.  Their daughter, Timothy [name], lives in [place].  She also is a Agricultural engineer.  The patient has an 61 year old grandson and an 52-year-old granddaughter, and that is really the main reason he moved to this area.  He is a Tourist information centre manager.     ADVANCED DIRECTIVES: In place  HEALTH MAINTENANCE: (Updated 04/13/2014) History  Substance Use Topics  . Smoking status: Never Smoker   . Smokeless tobacco: Never Used  . Alcohol Use: No     Colonoscopy: Nov 2013, Dr. Watt Climes  PSA: Not on file  Bone density:  Feb 2014;  Patient also has known insufficiency and pathologic fractures in addition to his long-standing history of steroid use.  Lipid panel: Jan 2015, elevated    Allergies  Allergen Reactions  . Benadryl [Diphenhydramine Hcl]     "Restless leg syndrome"    Current Outpatient Prescriptions  Medication  Sig Dispense Refill  . acyclovir (ZOVIRAX) 400 MG tablet Take 2 tablets (800 mg total) by mouth 2 (two) times daily.  120 tablet  3  . budesonide (ENTOCORT EC) 3 MG 24 hr capsule Take 1 capsule (3 mg total) by mouth 3 (three) times daily.  90 capsule  6  . cholecalciferol (VITAMIN D) 1000 UNITS tablet Take 1,000 Units by mouth every evening.       . diltiazem (DILACOR XR) 240 MG 24 hr capsule Take 240 mg by mouth every evening.      . ferrous sulfate 325 (65 FE) MG tablet Take 325 mg by mouth daily with breakfast.      . fluconazole (DIFLUCAN) 100 MG tablet Take 1 tablet (100 mg total) by mouth daily.  30 tablet  6  . furosemide (LASIX) 20 MG tablet TAKE 1 TABLET BY MOUTH EVERY MORNING  30 tablet  2  . glucose blood (ONE TOUCH ULTRA TEST) test strip Test before meals and at bedtime.  300 each  0  .  HYDROcodone-acetaminophen (NORCO) 10-325 MG per tablet Take 1-2 tablets by mouth every 6 (six) hours as needed for moderate pain.  120 tablet  0  . Insulin Aspart Prot & Aspart (70-30) 100 UNIT/ML SUPN Inject 10 Units into the skin daily with breakfast.       . Insulin Pen Needle (B-D UF III MINI PEN NEEDLES) 31G X 5 MM MISC Use twice daily with insulin as directed  100 each  5  . labetalol (NORMODYNE) 200 MG tablet Take 400 mg by mouth 2 (two) times daily.       . Lidocaine-Hydrocortisone Ace 3-0.5 % KIT Apply 1 application topically as needed (for pain).       Marland Kitchen lidocaine-prilocaine (EMLA) cream Apply 1 application topically as needed. 1-2 hrs before each port access  30 g  6  . lisinopril (PRINIVIL,ZESTRIL) 10 MG tablet Take 10 mg by mouth every evening.       . loratadine (CLARITIN) 10 MG tablet Take 10 mg by mouth daily.      . Multiple Vitamin (MULTIVITAMIN WITH MINERALS) TABS tablet Take 1 tablet by mouth daily.      Marland Kitchen omeprazole (PRILOSEC) 20 MG capsule Take 20 mg by mouth daily.      Glory Rosebush DELICA LANCETS 24O MISC Test before meals and at bedtime.  300 each  0  . predniSONE (DELTASONE) 5  MG tablet Take 0.5-1 tablets (2.5-5 mg total) by mouth 2 (two) times daily. Takes $RemoveBefo'5mg'vLnTspSutyE$  in the morning and 2.$RemoveBefor'5mg'YDEJbGGshgpB$  in the evening.  45 tablet  2  . sertraline (ZOLOFT) 50 MG tablet ALTERNATE TAKING 1 TABLET DAILY AND 2 TABLETS DAILY  90 tablet  3  . sulfamethoxazole-trimethoprim (BACTRIM DS) 800-160 MG per tablet Take 1 tablet by mouth daily.      . tacrolimus (PROGRAF) 0.5 MG capsule TAKE 3 CAPSULES BY MOUTH TWO TIMES DAILY  180 capsule  2  . vancomycin (VANCOCIN) 125 MG capsule Take 1 capsule (125 mg total) by mouth 4 (four) times daily.  120 capsule  6  . VOLTAREN 1 % GEL Apply 2 g topically 2 (two) times daily. Applied to back      . zinc sulfate 220 MG capsule Take 220 mg by mouth daily.      . [DISCONTINUED] insulin aspart (NOVOLOG FLEXPEN) 100 UNIT/ML SOPN FlexPen 18units sq qam, 9units sq qpm, or as directed  15 mL  1   No current facility-administered medications for this visit.   Facility-Administered Medications Ordered in Other Visits  Medication Dose Route Frequency Provider Last Rate Last Dose  . 0.9 %  sodium chloride infusion   Intravenous Continuous Kolbey Teichert G Dyquan Minks, PA-C 500 mL/hr at 03/12/13 0900    . sodium chloride 0.9 % injection 10 mL  10 mL Intravenous PRN Chauncey Cruel, MD   10 mL at 08/11/12 1606    OBJECTIVE: Middle-aged white male who appears comfortable and is in no acute distress   Filed Vitals:   04/13/14 1505  BP: 136/77  Pulse: 76  Temp: 98.3 F (36.8 C)  Resp: 18  Body mass index is 33.09 kg/(m^2).  ECOG: 1 Filed Weights   04/13/14 1505  Weight: 204 lb 14.4 oz (92.942 kg)   Remainder of physical exam was deferred today.   LABS:  CBC    Component Value Date/Time   WBC 7.3 04/13/2014 1450   WBC 7.1 04/06/2014 0949   RBC 4.09* 04/13/2014 1450   RBC 4.06* 04/06/2014 0949   RBC 3.82* 03/16/2013  1400   HGB 12.6* 04/13/2014 1450   HGB 12.2* 04/06/2014 0949   HCT 38.5 04/13/2014 1450   HCT 38.5* 04/06/2014 0949   PLT 176 04/13/2014 1450   PLT 144* 04/06/2014  0949   MCV 94.2 04/13/2014 1450   MCV 94.8 04/06/2014 0949   MCH 30.7 04/13/2014 1450   MCH 30.0 04/06/2014 0949   MCHC 32.6 04/13/2014 1450   MCHC 31.7 04/06/2014 0949   RDW 15.5* 04/13/2014 1450   RDW 15.7* 04/06/2014 0949   LYMPHSABS 2.3 04/13/2014 1450   LYMPHSABS 1.4 03/18/2013 0615   MONOABS 0.6 04/13/2014 1450   MONOABS 0.3 03/18/2013 0615   EOSABS 0.0 04/13/2014 1450   EOSABS 0.0 03/18/2013 0615   BASOSABS 0.0 04/13/2014 1450   BASOSABS 0.0 03/18/2013 0615        Chemistry      Component Value Date/Time   NA 139 04/13/2014 1450   NA 131* 06/14/2013 0800   K 5.0 04/13/2014 1450   K 4.7 06/14/2013 0800   CL 101 06/15/2013 1034   CL 97 06/14/2013 0800   CO2 15* 04/13/2014 1450   CO2 24 06/14/2013 0800   BUN 42.7* 04/13/2014 1450   BUN 38* 06/14/2013 0800   CREATININE 1.3 04/13/2014 1450   CREATININE 0.97 06/14/2013 0800      Component Value Date/Time   CALCIUM 9.4 04/13/2014 1450   CALCIUM 9.1 06/14/2013 0800   ALKPHOS 68 04/13/2014 1450   ALKPHOS 75 04/18/2013 1218   AST 24 04/13/2014 1450   AST 24 04/18/2013 1218   ALT 24 04/13/2014 1450   ALT 33 04/18/2013 1218   BILITOT 0.24 04/13/2014 1450   BILITOT 0.2* 04/18/2013 1218     Magnesium  1.6  04/23/215  Tacrolimus Level 7.3  02/16/2014    6.8  01/05/2014    13.2  11/24/2013  IgG   Pending 04/23/215    964  03/23/2014    320  02/09/2014      LDH   191  02/09/2014    257  08/11/2013    182  04/04/2013  STUDIES:  Ir Fluoro Guide Cv Line Right  04/06/2014   CLINICAL DATA:  68 year old male with chronic lymphocytic leukemia. He requires weekly magnesium infusions an occasional intravenous immunoglobulin.  EXAM: IR RIGHT FLOURO GUIDE CV LINE; IR ULTRASOUND GUIDANCE VASC ACCESS RIGHT  Date: 04/06/2014  ANESTHESIA/SEDATION: Moderate (conscious) sedation was administered during this procedure. A total of 3 mg Versed and 75 mg Fentanyl were administered intravenously. The patient's vital signs were monitored continuously by radiology  nursing throughout the course of the procedure.  Total sedation time: 22 minutes  FLUOROSCOPY TIME:  6 seconds  TECHNIQUE: The right neck and chest was prepped with chlorhexidine, and draped in the usual sterile fashion using maximum barrier technique (cap and mask, sterile gown, sterile gloves, large sterile sheet, hand hygiene and cutaneous antiseptic). Antibiotic prophylaxis was provided with 2g Ancef administered IV one hour prior to skin incision. Local anesthesia was attained by infiltration with 1% lidocaine with epinephrine.  Ultrasound demonstrated patency of the right internal jugular vein, and this was documented with an image. Under real-time ultrasound guidance, this vein was accessed with a 21 gauge micropuncture needle and image documentation was performed. A small dermatotomy was made at the access site with an 11 scalpel. A 0.018" wire was advanced into the SVC and the access needle exchanged for a 10F micropuncture vascular sheath. The 0.018" wire was then removed and a 0.035" wire advanced  into the IVC.  An appropriate location for the subcutaneous reservoir was selected below the clavicle and an incision was made through the skin and underlying soft tissues. The subcutaneous tissues were then dissected using a combination of blunt and sharp surgical technique and a pocket was formed. A single lumen power injectable portacatheter was then tunneled through the subcutaneous tissues from the pocket to the dermatotomy and the port reservoir placed within the subcutaneous pocket.  The venous access site was then serially dilated and a peel away vascular sheath placed over the wire. The wire was removed and the port catheter advanced into position under fluoroscopic guidance. The catheter tip is positioned in the upper right atrium. This was documented with a spot image. The portacatheter was then tested and found to flush and aspirate well. The port was flushed with saline followed by 100 units/mL  heparinized saline.  The pocket was then closed in two layers using first subdermal inverted interrupted absorbable sutures followed by a running subcuticular suture. The epidermis was then sealed with Dermabond. The dermatotomy at the venous access site was also closed with a single inverted subdermal suture and the epidermis sealed with Dermabond.  COMPLICATIONS: None.  The patient tolerated the procedure well.  IMPRESSION: Successful placement of a right IJ approach Power Port with ultrasound and fluoroscopic guidance. The catheter is ready for use.  Signed,  Criselda Peaches, MD  [facility]  [facility]   Electronically Signed   By: Jacqulynn Cadet M.D.   On: 04/06/2014 17:38     ASSESSMENT: 68 y.o. Timothy [name] man with a history of well-differentiated lymphocytic lymphoma/ chronic lymphoid leukemia initially diagnosed in 2000, not requiring intervention until 2006; with multiple chromosomal abnormalities.  His treatment history is as follows:  (1) fludarabine/cyclophosphamide/rituximab x5 completed May 2007.   (2) rituximab for 8 doses October 2010, with partial response   (3) Leustatin and ofatumumab weekly x8 July to September 2011 followed by maintenance ofatumumab  every 2 months, with initial response but rising counts September 2012   (4) status-post unrelated donor stem-cell transplant 02/24/2012 at the [facility]  (a) conditioning regimen consisted of fludarabine + TBI at 200 cGy, followed by rituximab x27;  (b) CMV reactivation x3 (patient CMV positive, donor negative), s/p ganciclovir treatment; 3d reactivation August 2013, s/p gancyclovir, with negative PCR mid-September 2013; last gancyclovir dose 10/06/2012 (c) Chronic GVHD: involving gut and skin, treated with steroids, tacrolimus and MMF.  MMF was eventually d/c'd and tacrolimus currently at a dose of 1.$RemoveB'5mg'MJvrZNLz$  BID (d) atrial fibrillation: resolved on brief amiodarone regimen (e)  steroid-induced myopathy: improving  (f) hypomagnesemia: improved after d/c gancyclovir, needs continuing support (g) hypogammaglobulinemia: requiring IVIG most recently 03/15/2014. (h) history of elevated triglycerides (606 on 07/14/2012)  (i) adrenal insufficiency: on prednisone and budesonide (j) pancytopenia,resolved (k) brief episode of neutropenia (ANC 300) February 2015, accompanied by diarrhea; resolved   (5) restaging studies September 2013  including CT scans, flow cytometry, and bone marrow biopsy, showed no evidence of residual chronic lymphoid leukemia.  (6) recurrent GVHD (skin rash, mouth changes, severe diarrhea and gastric/duodenal/colonic biopsies 11/17/2012 c/w GVHD grade 2) : now at most grade 1 GI with no significant skin or mouth changes  (7)  malnutrition -- on VITAL supplement in addition to regular diet; on Marinol for anorexia  (8) testosterone deficiency--on patch   (9) deconditioning: ongoing REHAB   (10) mild dehydration: encouraged increased po fluids; receives IVF support w magnesium weekly  (11) severe steroid-induced  osteoporosis with compression fractures: received pamidronate 12/18/2012. Status post kyphoplasty at L3-4 in June 2014. Also with evidence of rib fractures and insufficiency fractures bilaterally of the sacral  alae, noted by CT in March 2015. --   Denosumab started 12/08/2013, initially every 3 months, but with symptomatic progression of his osteoporosis will give monthly x6 then switch to prolia Q6 months for maintenance  (12) chronic back pain and hip pain controlled with OxyContin and hydrocodone/APAP.  (12) nausea: well controlled on current meds  (13)  Positive c.diff, 03/08/2013, on Flagyl 500 mg TID x 20 days, then on oral vanco with Questran, showing improvement; positive when repeated April 2014; Negative x 3   Results for AAREN, ATALLAH (MRN [id number]) as of 02/02/2014 14:19  Ref. Range 03/08/2013 15:26 03/29/2013 13:45 12/08/2013  11:52 01/18/2014 14:08 01/27/2014 13:31  C difficile by pcr Latest Range: Negative  Positive (AA) Positive (AA) Negative Negative Negative   (14) persistently increased BUN  (15)  Hypertension, on labetalol, cardizem, lisinopril, and furosemide;Lisinopril increased by Dr. Brigitte Pulse November 2014   (16) steroid induced hyperglycemia, on sterlix and 70/30 insulin, hemoglobin A1c 5.2 November of 2014;  Followed by Dr. Loanne Drilling and Dr. Brigitte Pulse  (17) hypogammaglobulinemia-- requiring intermittent supplementation  (18) squamous cell CA in situ removed from left parietal scalp October 2014   PLAN: Our entire 15 minute appointment today was spent reviewing  Merrit's recent labs, reviewing his medications, reviewing our upcoming treatment plan, and coordinating care.    He'll continue to return on a weekly basis for IV fluids with IV magnesium, and we will follow his labs closely. We will plan on seeing him approximately every 2 weeks for followup. For his osteopenia, Dr. Jana Hakim plans to treat with denosumab monthly for 6 months, from April through August.  After that we should be able to start Prolia for maintenance.  For now he will continue on his current dose of prednisone, 5 mg in the morning and 2.5 mg in the evening. We will continue to try tapering very slowly.   The necessary medications have been refilled today. We reviewed Ivor's plan in detail, and both he and Timothy [name] voice their understanding and agreement. Of course they know to call with any changes or problems. His next followup appointment will be with Dr. Jana Hakim in 2 weeks, May 7.  Theotis Burrow, PA-C 04/13/2014

## 2014-04-17 ENCOUNTER — Telehealth: Payer: Self-pay | Admitting: *Deleted

## 2014-04-17 NOTE — Telephone Encounter (Signed)
I have adjusted appts 

## 2014-04-18 ENCOUNTER — Other Ambulatory Visit: Payer: Self-pay | Admitting: Dermatology

## 2014-04-18 LAB — IGG, IGA, IGM
IGG (IMMUNOGLOBIN G), SERUM: 471 mg/dL — AB (ref 650–1600)
IgA: 6 mg/dL — ABNORMAL LOW (ref 68–379)
IgM, Serum: 18 mg/dL — ABNORMAL LOW (ref 41–251)

## 2014-04-20 ENCOUNTER — Ambulatory Visit (HOSPITAL_BASED_OUTPATIENT_CLINIC_OR_DEPARTMENT_OTHER): Payer: BC Managed Care – PPO

## 2014-04-20 ENCOUNTER — Other Ambulatory Visit (HOSPITAL_BASED_OUTPATIENT_CLINIC_OR_DEPARTMENT_OTHER): Payer: BC Managed Care – PPO

## 2014-04-20 DIAGNOSIS — T8609 Other complications of bone marrow transplant: Secondary | ICD-10-CM

## 2014-04-20 DIAGNOSIS — C9110 Chronic lymphocytic leukemia of B-cell type not having achieved remission: Secondary | ICD-10-CM

## 2014-04-20 DIAGNOSIS — Z9489 Other transplanted organ and tissue status: Secondary | ICD-10-CM

## 2014-04-20 DIAGNOSIS — D89811 Chronic graft-versus-host disease: Secondary | ICD-10-CM

## 2014-04-20 LAB — COMPREHENSIVE METABOLIC PANEL (CC13)
ALT: 32 U/L (ref 0–55)
AST: 27 U/L (ref 5–34)
Albumin: 3.7 g/dL (ref 3.5–5.0)
Alkaline Phosphatase: 68 U/L (ref 40–150)
Anion Gap: 9 mEq/L (ref 3–11)
BUN: 42.4 mg/dL — ABNORMAL HIGH (ref 7.0–26.0)
CALCIUM: 9.2 mg/dL (ref 8.4–10.4)
CHLORIDE: 115 meq/L — AB (ref 98–109)
CO2: 17 meq/L — AB (ref 22–29)
CREATININE: 1.1 mg/dL (ref 0.7–1.3)
Glucose: 121 mg/dl (ref 70–140)
Potassium: 4.8 mEq/L (ref 3.5–5.1)
SODIUM: 141 meq/L (ref 136–145)
Total Bilirubin: 0.26 mg/dL (ref 0.20–1.20)
Total Protein: 5.9 g/dL — ABNORMAL LOW (ref 6.4–8.3)

## 2014-04-20 LAB — CBC WITH DIFFERENTIAL/PLATELET
BASO%: 0.4 % (ref 0.0–2.0)
BASOS ABS: 0 10*3/uL (ref 0.0–0.1)
EOS ABS: 0 10*3/uL (ref 0.0–0.5)
EOS%: 0.4 % (ref 0.0–7.0)
HEMATOCRIT: 38.4 % (ref 38.4–49.9)
HEMOGLOBIN: 12.6 g/dL — AB (ref 13.0–17.1)
LYMPH%: 36.3 % (ref 14.0–49.0)
MCH: 30.6 pg (ref 27.2–33.4)
MCHC: 32.7 g/dL (ref 32.0–36.0)
MCV: 93.4 fL (ref 79.3–98.0)
MONO#: 0.8 10*3/uL (ref 0.1–0.9)
MONO%: 9.5 % (ref 0.0–14.0)
NEUT#: 4.6 10*3/uL (ref 1.5–6.5)
NEUT%: 53.4 % (ref 39.0–75.0)
Platelets: 182 10*3/uL (ref 140–400)
RBC: 4.11 10*6/uL — ABNORMAL LOW (ref 4.20–5.82)
RDW: 15.3 % — AB (ref 11.0–14.6)
WBC: 8.6 10*3/uL (ref 4.0–10.3)
lymph#: 3.1 10*3/uL (ref 0.9–3.3)

## 2014-04-20 LAB — LACTATE DEHYDROGENASE (CC13): LDH: 178 U/L (ref 125–245)

## 2014-04-20 LAB — MAGNESIUM (CC13): MAGNESIUM: 1.8 mg/dL (ref 1.5–2.5)

## 2014-04-20 MED ORDER — HEPARIN SOD (PORK) LOCK FLUSH 100 UNIT/ML IV SOLN
500.0000 [IU] | Freq: Once | INTRAVENOUS | Status: AC
  Administered 2014-04-20: 500 [IU] via INTRAVENOUS
  Filled 2014-04-20: qty 5

## 2014-04-20 MED ORDER — SODIUM CHLORIDE 0.9 % IV SOLN
Freq: Once | INTRAVENOUS | Status: AC
  Administered 2014-04-20: 14:00:00 via INTRAVENOUS
  Filled 2014-04-20: qty 500

## 2014-04-20 MED ORDER — SODIUM CHLORIDE 0.9 % IJ SOLN
10.0000 mL | INTRAMUSCULAR | Status: DC | PRN
  Administered 2014-04-20: 10 mL via INTRAVENOUS
  Filled 2014-04-20: qty 10

## 2014-04-21 LAB — TACROLIMUS LEVEL: Tacrolimus Lvl: 6.6 ng/mL (ref 5.0–20.0)

## 2014-04-27 ENCOUNTER — Ambulatory Visit (HOSPITAL_BASED_OUTPATIENT_CLINIC_OR_DEPARTMENT_OTHER): Payer: BC Managed Care – PPO

## 2014-04-27 ENCOUNTER — Other Ambulatory Visit: Payer: Self-pay

## 2014-04-27 ENCOUNTER — Other Ambulatory Visit (HOSPITAL_BASED_OUTPATIENT_CLINIC_OR_DEPARTMENT_OTHER): Payer: BC Managed Care – PPO

## 2014-04-27 ENCOUNTER — Ambulatory Visit (HOSPITAL_BASED_OUTPATIENT_CLINIC_OR_DEPARTMENT_OTHER): Payer: BC Managed Care – PPO | Admitting: Oncology

## 2014-04-27 VITALS — BP 155/79 | HR 76 | Temp 98.1°F | Resp 20 | Ht 66.0 in | Wt 212.2 lb

## 2014-04-27 DIAGNOSIS — E860 Dehydration: Secondary | ICD-10-CM

## 2014-04-27 DIAGNOSIS — T8609 Other complications of bone marrow transplant: Secondary | ICD-10-CM

## 2014-04-27 DIAGNOSIS — Z9489 Other transplanted organ and tissue status: Secondary | ICD-10-CM

## 2014-04-27 DIAGNOSIS — C9110 Chronic lymphocytic leukemia of B-cell type not having achieved remission: Secondary | ICD-10-CM

## 2014-04-27 DIAGNOSIS — T380X5A Adverse effect of glucocorticoids and synthetic analogues, initial encounter: Secondary | ICD-10-CM

## 2014-04-27 DIAGNOSIS — M810 Age-related osteoporosis without current pathological fracture: Secondary | ICD-10-CM

## 2014-04-27 DIAGNOSIS — G8929 Other chronic pain: Secondary | ICD-10-CM

## 2014-04-27 DIAGNOSIS — D849 Immunodeficiency, unspecified: Secondary | ICD-10-CM

## 2014-04-27 DIAGNOSIS — D89811 Chronic graft-versus-host disease: Secondary | ICD-10-CM

## 2014-04-27 DIAGNOSIS — M549 Dorsalgia, unspecified: Principal | ICD-10-CM

## 2014-04-27 DIAGNOSIS — E099 Drug or chemical induced diabetes mellitus without complications: Secondary | ICD-10-CM

## 2014-04-27 DIAGNOSIS — D899 Disorder involving the immune mechanism, unspecified: Secondary | ICD-10-CM

## 2014-04-27 LAB — COMPREHENSIVE METABOLIC PANEL (CC13)
ALT: 32 U/L (ref 0–55)
AST: 26 U/L (ref 5–34)
Albumin: 3.7 g/dL (ref 3.5–5.0)
Alkaline Phosphatase: 67 U/L (ref 40–150)
Anion Gap: 9 mEq/L (ref 3–11)
BILIRUBIN TOTAL: 0.27 mg/dL (ref 0.20–1.20)
BUN: 43.8 mg/dL — AB (ref 7.0–26.0)
CHLORIDE: 112 meq/L — AB (ref 98–109)
CO2: 19 mEq/L — ABNORMAL LOW (ref 22–29)
CREATININE: 1.1 mg/dL (ref 0.7–1.3)
Calcium: 9.2 mg/dL (ref 8.4–10.4)
Glucose: 88 mg/dl (ref 70–140)
Potassium: 5.2 mEq/L — ABNORMAL HIGH (ref 3.5–5.1)
Sodium: 140 mEq/L (ref 136–145)
Total Protein: 6.1 g/dL — ABNORMAL LOW (ref 6.4–8.3)

## 2014-04-27 LAB — CBC WITH DIFFERENTIAL/PLATELET
BASO%: 0.4 % (ref 0.0–2.0)
BASOS ABS: 0 10*3/uL (ref 0.0–0.1)
EOS%: 0.6 % (ref 0.0–7.0)
Eosinophils Absolute: 0 10*3/uL (ref 0.0–0.5)
HCT: 38.5 % (ref 38.4–49.9)
HEMOGLOBIN: 12.6 g/dL — AB (ref 13.0–17.1)
LYMPH%: 38.9 % (ref 14.0–49.0)
MCH: 30.7 pg (ref 27.2–33.4)
MCHC: 32.7 g/dL (ref 32.0–36.0)
MCV: 94 fL (ref 79.3–98.0)
MONO#: 0.6 10*3/uL (ref 0.1–0.9)
MONO%: 8.7 % (ref 0.0–14.0)
NEUT#: 3.8 10*3/uL (ref 1.5–6.5)
NEUT%: 51.4 % (ref 39.0–75.0)
PLATELETS: 196 10*3/uL (ref 140–400)
RBC: 4.1 10*6/uL — ABNORMAL LOW (ref 4.20–5.82)
RDW: 15.6 % — ABNORMAL HIGH (ref 11.0–14.6)
WBC: 7.3 10*3/uL (ref 4.0–10.3)
lymph#: 2.8 10*3/uL (ref 0.9–3.3)

## 2014-04-27 LAB — MAGNESIUM (CC13): Magnesium: 1.9 mg/dl (ref 1.5–2.5)

## 2014-04-27 MED ORDER — CHOLESTYRAMINE 4 G PO PACK: 4.0000 g | PACK | Freq: Every day | ORAL | Status: DC

## 2014-04-27 MED ORDER — SODIUM CHLORIDE 0.9 % IJ SOLN
10.0000 mL | INTRAMUSCULAR | Status: DC | PRN
  Administered 2014-04-27: 10 mL via INTRAVENOUS
  Filled 2014-04-27: qty 10

## 2014-04-27 MED ORDER — SODIUM CHLORIDE 0.9 % IV SOLN
INTRAVENOUS | Status: DC
  Administered 2014-04-27: 15:00:00 via INTRAVENOUS

## 2014-04-27 MED ORDER — HEPARIN SOD (PORK) LOCK FLUSH 100 UNIT/ML IV SOLN
500.0000 [IU] | Freq: Once | INTRAVENOUS | Status: AC
  Administered 2014-04-27: 500 [IU] via INTRAVENOUS
  Filled 2014-04-27: qty 5

## 2014-04-27 MED ORDER — SODIUM CHLORIDE 0.9 % IV SOLN
Freq: Once | INTRAVENOUS | Status: AC
  Administered 2014-04-27: 16:00:00 via INTRAVENOUS
  Filled 2014-04-27: qty 500

## 2014-04-27 NOTE — Patient Instructions (Signed)
Hypomagnesemia Magnesium is a common ion (mineral) in the body which is needed for metabolism. It is about how the body handles food and other chemical reactions necessary for life. Only about 2% of the magnesium in our body is found in the blood. When this is low, it is called hypomagnesemia. The blood will measure only a tiny amount of the magnesium in our body. When it is low in our blood, it does not mean that the whole body supply is low. The normal serum concentration ranges from 1.8-2.5 mEq/L. When the level gets to be less than 1.0 mEq/L, a number of problems begin to happen.  CAUSES   Receiving intravenous fluids without magnesium replacement.  Loss of magnesium from the bowel by naso-gastric suction.  Loss of magnesium from nausea and vomiting or severe diarrhea. Any of the inflammatory bowel conditions can cause this.  Abuse of alcohol often leads to low serum magnesium.  An inherited form of magnesium loss happens when the kidneys lose magnesium. This is called familial or primary hypomagnesemia.  Some medications such as diuretics also cause the loss of magnesium. SYMPTOMS  These following problems are worse if the changes in magnesium levels come on suddenly.  Tremor.  Confusion.  Muscle weakness.  Over-sensitive to sights and sounds.  Sensitive reflexes.  Depression.  Muscular fibrillations.  Over-reactivity of the nerves.  Irritability.  Psychosis.  Spasms of the hand muscles.  Tetany (where the muscles go into uncontrollable spasms). DIAGNOSIS  This condition can be diagnosed by blood tests. TREATMENT   In emergency, magnesium can be given intravenously (by vein).  If the condition is less worrisome, it can be corrected by diet. High levels of magnesium are found in green leafy vegetables, peas, beans and nuts among other things. It can also be given through medications by mouth.  If it is being caused by medications, changes can be made.  If  alcohol is a problem, help is available if there are difficulties giving it up. Document Released: 09/03/2005 Document Revised: 03/01/2012 Document Reviewed: 07/28/2008 [employer] Patient Information 2014 [employer].        

## 2014-04-27 NOTE — Addendum Note (Signed)
Addended by: Laureen Abrahams on: 04/27/2014 05:55 PM   Modules accepted: Orders, Medications

## 2014-04-27 NOTE — Progress Notes (Signed)
ID: Timothy [name]   DOB: October 19, 1946  MR#: [id number]  CLX#:[id number]  SSZ:LVWN, Chrissie Noa, MD SU: OTHER MD: Myrtice Lauth; Romero Belling, MD; Lorina Rabon, [name],MD; Doneen Poisson, MD; [facility] in [place], [place]:  Janett Billow, RN (FAX #[phone])  CHIEF COMPLAINT:  CLL, status post allogeneic stem cell transplant, GVHD   HISTORY OF CLL: We have very complete records from Dr. Sydnee Levans in [place], and in summary:  The patient was initially diagnosed in August 2000, with a white cell count of 23,600, but normal hemoglobin and platelets, and no significant symptomatology. Over the next several years his white cell count drifted up, and he eventually developed some symptoms of night sweats in particular, leading to treatment with fludarabine, Cytoxan and rituxan for five cycles given between December 2006 and May 2007.  We have CT scans from June 2006, November 2006 and April 2007, and comparing the November 2006 and April 2007 scans, there was near complete response. He had subsequent therapy in [place] as detailed below, but with decreased response, leading to allogeneic stem-cell transplant at the [facility] 02/24/2012.  Subsequent history is as detailed below.  INTERVAL HISTORY: Timothy [name] returns today accompanied by his wife [name] for followup of his chronic lymphoid leukemia status post allogenic transplant. 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 He continues to receive weekly intravenous fluids with magnesium and his visits are now once a month  REVIEW OF SYSTEMS: Timothy [name] tells me since his last visit here he's been found by Dr. Karlyn Agee to have another scalp squamous cell tumor which will be removed next week. His diarrhea continues to be about 4 bowel movements daily, the first a little more voluminous, the other ones scant, older is soft, but  predictable. There have been no mouth sores no fever, no cough, shortness of breath, pleurisy, or phlegm production. He can climb a flight of stairs without stopping, although he has to pull himself a long because his quads are still not back strong. He cannot rise from a chair without pushing himself off. He sugars are well-controlled usually in the 160/165 range maximum. There has been no intercurrent fever, no rash, and no bleeding. His back pain is improved. A detailed review of systems today was otherwise noncontributory  PAST MEDICAL HISTORY: Past Medical History  Diagnosis Date  . 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 Diverticular disease   . Hyperlipidemia   . Obesity   . Hypertension   . Hiatal hernia   . CMV (cytomegalovirus) antibody positive     pre-transplant, with seroconversion x2 pst-transplant  . Right bundle branch block     pre-transplant  . CKD (chronic kidney disease) stage 2, GFR 60-89 ml/min   . Pancytopenia   . Steroid-induced diabetes   . Atrial fibrillation     post-transplant  . Myopathy   . 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 Leukemia, chronic lymphoid   . 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 Diarrhea in adult patient 12/05/2012    Due to active GVHD  . CLL (chronic lymphocytic leukemia) 12/05/2012    Dx 07/1999; started Rx 12/06  AlloBMT 3/13  . Rash of face 12/05/2012    Due to GVHD  . Hypomagnesemia 01/26/2013  . Left hip pain 12/01/2013    PAST SURGICAL HISTORY:  Past Surgical History  Procedure Laterality Date  . Tonsillectomy and adenoidectomy    . Bone marrow transplant    . Flexible sigmoidoscopy  11/17/2012    Procedure: FLEXIBLE SIGMOIDOSCOPY;  Surgeon: Jeryl Columbia, MD;  Location: [facility] ENDOSCOPY;  Service: Endoscopy;  Laterality: N/A;  Dr Watt Climes states will be admitted to rooom 1339 11/16/12  . Esophagogastroduodenoscopy   11/17/2012    Procedure: ESOPHAGOGASTRODUODENOSCOPY (EGD);  Surgeon: Jeryl Columbia, MD;  Location: [facility];  Service: Endoscopy;  Laterality: N/A;    FAMILY HISTORY Family History  Problem Relation Age of Onset  . Cancer Father    The patient's father died from complications of chronic lymphocytic leukemia at the age of 65.  It had been diagnosed seven years before when he was 27.  The patient is enrolled in a familial chronic lymphocytic leukemia study out of the [employer].  The patient's mother is 26, alive, unfortunately suffering with dementia, and he has a brother, 64, who is otherwise in fair health.   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  His wife of >40 years, [name], is a homemaker.  Their daughter, [name], lives in [place].  She also is a Agricultural engineer.  The patient has an 46 year old grandson and an 36-year-old granddaughter, and that is really the main reason he moved to this area.  He is a Tourist information centre manager.     ADVANCED DIRECTIVES: In place  HEALTH MAINTENANCE: (Updated 04/13/2014) History  Substance Use Topics  . Smoking status: Never Smoker   . Smokeless tobacco: Never Used  . Alcohol Use: No     Colonoscopy: Nov 2013, Dr. Watt Climes  PSA: Not on file  Bone density:  Feb 2014;  Patient also has known insufficiency and pathologic fractures in addition to his long-standing history of steroid use.  Lipid panel: Jan 2015, elevated    Allergies  Allergen Reactions  . Benadryl [Diphenhydramine Hcl]     "Restless leg syndrome"    Current Outpatient Prescriptions  Medication Sig Dispense Refill  . acyclovir (ZOVIRAX) 400 MG tablet Take 2 tablets (800 mg total) by mouth 2 (two) times daily.  120 tablet  3  . budesonide (ENTOCORT EC) 3 MG 24 hr capsule Take 1 capsule (3 mg total) by mouth 3 (three) times daily.  90  capsule  6  . cholecalciferol (VITAMIN D) 1000 UNITS tablet Take 1,000 Units by mouth every evening.       . diltiazem (DILACOR XR) 240 MG 24 hr capsule Take 1 capsule (240 mg total) by mouth every evening.  30 capsule  5  . ferrous sulfate 325 (65 FE) MG tablet Take 325 mg by mouth daily with breakfast.      . fluconazole (DIFLUCAN) 100 MG tablet Take 1 tablet (100 mg total) by mouth daily.  30 tablet  6  . furosemide (LASIX) 20 MG tablet Take 1 tablet (20 mg total) by mouth daily.  30 tablet  5  . glucose blood (ONE TOUCH ULTRA TEST) test strip Test before meals and at bedtime.  300 each  0  . HYDROcodone-acetaminophen (NORCO) 10-325 MG per tablet Take 1-2 tablets by mouth every 6 (six) hours as needed for moderate pain.  120 tablet  0  . Insulin Aspart Prot & Aspart (70-30) 100 UNIT/ML SUPN Inject 10 Units into the skin daily with breakfast.       .  Insulin Pen Needle (B-D UF III MINI PEN NEEDLES) 31G X 5 MM MISC Use twice daily with insulin as directed  100 each  5  . labetalol (NORMODYNE) 200 MG tablet Take 400 mg by mouth 2 (two) times daily.       . Lidocaine-Hydrocortisone Ace 3-0.5 % KIT Apply 1 application topically as needed (for pain).  1 each  3  . lidocaine-prilocaine (EMLA) cream Apply 1 application topically as needed. 1-2 hrs before each port access  30 g  6  . lisinopril (PRINIVIL,ZESTRIL) 10 MG tablet Take 10 mg by mouth every evening.       . loratadine (CLARITIN) 10 MG tablet Take 10 mg by mouth daily.      . Multiple Vitamin (MULTIVITAMIN WITH MINERALS) TABS tablet Take 1 tablet by mouth daily.      Marland Kitchen omeprazole (PRILOSEC) 20 MG capsule Take 1 capsule (20 mg total) by mouth daily.  30 capsule  5  . ONETOUCH DELICA LANCETS 32D MISC Test before meals and at bedtime.  300 each  0  . predniSONE (DELTASONE) 5 MG tablet Take 0.5-1 tablets (2.5-5 mg total) by mouth 2 (two) times daily. Takes $RemoveBefo'5mg'EDJzHlSBLfV$  in the morning and 2.$RemoveBefor'5mg'CqcDqWgjuLms$  in the evening.  45 tablet  2  . sertraline (ZOLOFT) 50 MG  tablet ALTERNATE TAKING 1 TABLET DAILY AND 2 TABLETS DAILY  90 tablet  5  . sulfamethoxazole-trimethoprim (BACTRIM DS) 800-160 MG per tablet Take 1 tablet by mouth daily.  30 tablet  5  . tacrolimus (PROGRAF) 0.5 MG capsule TAKE 3 CAPSULES BY MOUTH TWO TIMES DAILY  180 capsule  3  . vancomycin (VANCOCIN) 125 MG capsule Take 1 capsule (125 mg total) by mouth 4 (four) times daily.  120 capsule  6  . VOLTAREN 1 % GEL Apply 2 g topically 2 (two) times daily. Applied to back      . zinc sulfate 220 MG capsule Take 220 mg by mouth daily.      . [DISCONTINUED] insulin aspart (NOVOLOG FLEXPEN) 100 UNIT/ML SOPN FlexPen 18units sq qam, 9units sq qpm, or as directed  15 mL  1   No current facility-administered medications for this visit.   Facility-Administered Medications Ordered in Other Visits  Medication Dose Route Frequency Provider Last Rate Last Dose  . 0.9 %  sodium chloride infusion   Intravenous Continuous Amy G Berry, PA-C 500 mL/hr at 03/12/13 0900    . sodium chloride 0.9 % injection 10 mL  10 mL Intravenous PRN Chauncey Cruel, MD   10 mL at 08/11/12 1606    OBJECTIVE: Middle-aged white man in no acute distress   Filed Vitals:   04/27/14 1337  BP: 155/79  Pulse: 76  Temp: 98.1 F (36.7 C)  Resp: 20  Body mass index is 34.27 kg/(m^2).  ECOG: 1 Filed Weights   04/27/14 1337  Weight: 212 lb 3.2 oz (96.253 kg)   Sclerae unicteric, pupils equal and reactive Oropharynx clear and moist-- no thrush or other lesions No cervical or supraclavicular adenopathy Lungs no rales or rhonchi, good excursion bilaterally Heart regular rate and rhythm, no murmur appreciated Abd soft, nontender, positive bowel sounds, no masses palpated MSK no focal spinal tenderness, bilateral ankle edema, grade 1-2, chronic, unchanged Neuro: nonfocal, well oriented, positive affect Skin: Bruising, but no rash or evidence of active graft-versus-host diisease     LABS:  CBC    Component Value Date/Time    WBC 7.3 04/27/2014 1324   WBC 7.1 04/06/2014  0949   RBC 4.10* 04/27/2014 1324   RBC 4.06* 04/06/2014 0949   RBC 3.82* 03/16/2013 1400   HGB 12.6* 04/27/2014 1324   HGB 12.2* 04/06/2014 0949   HCT 38.5 04/27/2014 1324   HCT 38.5* 04/06/2014 0949   PLT 196 04/27/2014 1324   PLT 144* 04/06/2014 0949   MCV 94.0 04/27/2014 1324   MCV 94.8 04/06/2014 0949   MCH 30.7 04/27/2014 1324   MCH 30.0 04/06/2014 0949   MCHC 32.7 04/27/2014 1324   MCHC 31.7 04/06/2014 0949   RDW 15.6* 04/27/2014 1324   RDW 15.7* 04/06/2014 0949   LYMPHSABS 2.8 04/27/2014 1324   LYMPHSABS 1.4 03/18/2013 0615   MONOABS 0.6 04/27/2014 1324   MONOABS 0.3 03/18/2013 0615   EOSABS 0.0 04/27/2014 1324   EOSABS 0.0 03/18/2013 0615   BASOSABS 0.0 04/27/2014 1324   BASOSABS 0.0 03/18/2013 0615        Chemistry      Component Value Date/Time   NA 140 04/27/2014 1325   NA 131* 06/14/2013 0800   K 5.2* 04/27/2014 1325   K 4.7 06/14/2013 0800   CL 101 06/15/2013 1034   CL 97 06/14/2013 0800   CO2 19* 04/27/2014 1325   CO2 24 06/14/2013 0800   BUN 43.8* 04/27/2014 1325   BUN 38* 06/14/2013 0800   CREATININE 1.1 04/27/2014 1325   CREATININE 0.97 06/14/2013 0800      Component Value Date/Time   CALCIUM 9.2 04/27/2014 1325   CALCIUM 9.1 06/14/2013 0800   ALKPHOS 67 04/27/2014 1325   ALKPHOS 75 04/18/2013 1218   AST 26 04/27/2014 1325   AST 24 04/18/2013 1218   ALT 32 04/27/2014 1325   ALT 33 04/18/2013 1218   BILITOT 0.27 04/27/2014 1325   BILITOT 0.2* 04/18/2013 1218    Results for INGRAM, ONNEN (MRN [id number]) as of 04/27/2014 14:14  Ref. Range 01/12/2014 13:49 01/26/2014 10:43 02/09/2014 11:26 03/23/2014 12:22 04/13/2014 14:50  IgG (Immunoglobin G), Serum Latest Range: [phone] mg/dL 411 (L) 375 (L) 320 (L) 964 471 (L)    Results for MATEUSZ, NEILAN (MRN [id number]) as of 04/27/2014 14:14  Ref. Range 11/26/2012 04:30 12/13/2012 16:25 12/20/2012 05:00 12/27/2012 04:40 03/16/2013 14:10  Tacrolimus ([id number]) - [facility] No range found 5.6 6.1 ng/mL 10.0 21.2 6.1   STUDIES: Ir Fluoro Guide  Cv Line Right  04/06/2014   CLINICAL DATA:  68 year old male with chronic lymphocytic leukemia. He requires weekly magnesium infusions an occasional intravenous immunoglobulin.  EXAM: IR RIGHT FLOURO GUIDE CV LINE; IR ULTRASOUND GUIDANCE VASC ACCESS RIGHT  Date: 04/06/2014  ANESTHESIA/SEDATION: Moderate (conscious) sedation was administered during this procedure. A total of 3 mg Versed and 75 mg Fentanyl were administered intravenously. The patient's vital signs were monitored continuously by radiology nursing throughout the course of the procedure.  Total sedation time: 22 minutes  FLUOROSCOPY TIME:  6 seconds  TECHNIQUE: The right neck and chest was prepped with chlorhexidine, and draped in the usual sterile fashion using maximum barrier technique (cap and mask, sterile gown, sterile gloves, large sterile sheet, hand hygiene and cutaneous antiseptic). Antibiotic prophylaxis was provided with 2g Ancef administered IV one hour prior to skin incision. Local anesthesia was attained by infiltration with 1% lidocaine with epinephrine.  Ultrasound demonstrated patency of the right internal jugular vein, and this was documented with an image. Under real-time ultrasound guidance, this vein was accessed with a 21 gauge micropuncture needle and image documentation was performed. A small dermatotomy was made at the access site with  an 11 scalpel. A 0.018" wire was advanced into the SVC and the access needle exchanged for a 6F micropuncture vascular sheath. The 0.018" wire was then removed and a 0.035" wire advanced into the IVC.  An appropriate location for the subcutaneous reservoir was selected below the clavicle and an incision was made through the skin and underlying soft tissues. The subcutaneous tissues were then dissected using a combination of blunt and sharp surgical technique and a pocket was formed. A single lumen power injectable portacatheter was then tunneled through the subcutaneous tissues from the pocket to  the dermatotomy and the port reservoir placed within the subcutaneous pocket.  The venous access site was then serially dilated and a peel away vascular sheath placed over the wire. The wire was removed and the port catheter advanced into position under fluoroscopic guidance. The catheter tip is positioned in the upper right atrium. This was documented with a spot image. The portacatheter was then tested and found to flush and aspirate well. The port was flushed with saline followed by 100 units/mL heparinized saline.  The pocket was then closed in two layers using first subdermal inverted interrupted absorbable sutures followed by a running subcuticular suture. The epidermis was then sealed with Dermabond. The dermatotomy at the venous access site was also closed with a single inverted subdermal suture and the epidermis sealed with Dermabond.  COMPLICATIONS: None.  The patient tolerated the procedure well.  IMPRESSION: Successful placement of a right IJ approach Power Port with ultrasound and fluoroscopic guidance. The catheter is ready for use.  Signed,  Criselda Peaches, MD  [facility]  [facility]   Electronically Signed   By: Jacqulynn Cadet M.D.   On: 04/06/2014 17:38   Ir US Guide Vasc Access Right  04/06/2014   CLINICAL DATA:  68 year old male with chronic lymphocytic leukemia. He requires weekly magnesium infusions an occasional intravenous immunoglobulin.  EXAM: IR RIGHT FLOURO GUIDE CV LINE; IR ULTRASOUND GUIDANCE VASC ACCESS RIGHT  Date: 04/06/2014  ANESTHESIA/SEDATION: Moderate (conscious) sedation was administered during this procedure. A total of 3 mg Versed and 75 mg Fentanyl were administered intravenously. The patient's vital signs were monitored continuously by radiology nursing throughout the course of the procedure.  Total sedation time: 22 minutes  FLUOROSCOPY TIME:  6 seconds  TECHNIQUE: The right neck and chest was prepped with  chlorhexidine, and draped in the usual sterile fashion using maximum barrier technique (cap and mask, sterile gown, sterile gloves, large sterile sheet, hand hygiene and cutaneous antiseptic). Antibiotic prophylaxis was provided with 2g Ancef administered IV one hour prior to skin incision. Local anesthesia was attained by infiltration with 1% lidocaine with epinephrine.  Ultrasound demonstrated patency of the right internal jugular vein, and this was documented with an image. Under real-time ultrasound guidance, this vein was accessed with a 21 gauge micropuncture needle and image documentation was performed. A small dermatotomy was made at the access site with an 11 scalpel. A 0.018" wire was advanced into the SVC and the access needle exchanged for a 6F micropuncture vascular sheath. The 0.018" wire was then removed and a 0.035" wire advanced into the IVC.  An appropriate location for the subcutaneous reservoir was selected below the clavicle and an incision was made through the skin and underlying soft tissues. The subcutaneous tissues were then dissected using a combination of blunt and sharp surgical technique and a pocket was formed. A single lumen power injectable portacatheter was then tunneled through the subcutaneous  tissues from the pocket to the dermatotomy and the port reservoir placed within the subcutaneous pocket.  The venous access site was then serially dilated and a peel away vascular sheath placed over the wire. The wire was removed and the port catheter advanced into position under fluoroscopic guidance. The catheter tip is positioned in the upper right atrium. This was documented with a spot image. The portacatheter was then tested and found to flush and aspirate well. The port was flushed with saline followed by 100 units/mL heparinized saline.  The pocket was then closed in two layers using first subdermal inverted interrupted absorbable sutures followed by a running subcuticular suture. The  epidermis was then sealed with Dermabond. The dermatotomy at the venous access site was also closed with a single inverted subdermal suture and the epidermis sealed with Dermabond.  COMPLICATIONS: None.  The patient tolerated the procedure well.  IMPRESSION: Successful placement of a right IJ approach Power Port with ultrasound and fluoroscopic guidance. The catheter is ready for use.  Signed,  Criselda Peaches, MD  [facility]  [facility]   Electronically Signed   By: Jacqulynn Cadet M.D.   On: 04/06/2014 17:38   ASSESSMENT: 68 y.o. [place] man with a history of well-differentiated lymphocytic lymphoma/ chronic lymphoid leukemia initially diagnosed in 2000, not requiring intervention until 2006; with multiple chromosomal abnormalities.  His treatment history is as follows:  (1) fludarabine/cyclophosphamide/rituximab x5 completed May 2007.   (2) rituximab for 8 doses October 2010, with partial response   (3) Leustatin and ofatumumab weekly x8 July to September 2011 followed by maintenance ofatumumab  every 2 months, with initial response but rising counts September 2012   (4) status-post unrelated donor stem-cell transplant 02/24/2012 [facility]  (a) conditioning regimen consisted of fludarabine + TBI at 200 cGy, followed by rituximab x27;  (b) CMV reactivation x3 (patient CMV positive, donor negative), s/p ganciclovir treatment; 3d reactivation August 2013, s/p gancyclovir, with negative PCR mid-September 2013; last gancyclovir dose 10/06/2012 (c) Chronic GVHD: involving gut and skin, treated with steroids, tacrolimus and MMF.  MMF was eventually d/c'd and tacrolimus currently at a dose of 1.$RemoveB'5mg'WjIQEbOf$  BID (d) atrial fibrillation: resolved on brief amiodarone regimen (e) steroid-induced myopathy: improving  (f) hypomagnesemia: improved after d/c gancyclovir, needs continuing support (g) hypogammaglobulinemia: requiring IVIG most recently  03/15/2014. (h) history of elevated triglycerides (606 on 07/14/2012)  (i) adrenal insufficiency: on prednisone and budesonide (j) pancytopenia,resolved (k) brief episode of neutropenia (ANC 300) February 2015, accompanied by diarrhea; resolved   (5) restaging studies including CT scans, flow cytometry, and bone marrow biopsy, showed no evidence of residual chronic lymphoid leukemia.  (6) recurrent GVHD (skin rash, mouth changes, severe diarrhea and gastric/duodenal/colonic biopsies 11/17/2012 c/w GVHD grade 2) : now at most grade 1 GI with no significant skin or mouth changes  (7)  malnutrition -- on VITAL supplement in addition to regular diet; on Marinol for anorexia  (8) testosterone deficiency--on patch   (9) deconditioning: Especially quad weakness  (10) mild dehydration: encouraged increased po fluids; receives IVF support w magnesium weekly  (11) severe steroid-induced osteoporosis with compression fractures: received pamidronate 12/18/2012. Status post kyphoplasty at L3-4 in June 2014. Also with evidence of rib fractures and insufficiency fractures bilaterally of the sacral  alae, noted by CT in March 2015. --   Denosumab started 12/08/2013, given as prolia Q6 months which is what has been approved by his insurance  (12) chronic back pain and hip pain controlled  with OxyContin and hydrocodone/APAP.  (12) nausea: well controlled on current meds  (13)  Positive c.diff, 03/08/2013, on Flagyl 500 mg TID x 20 days, then on oral vanco with Questran, showing improvement; positive when repeated April 2014; Negative x 3   Results for CAYDAN, MCTAVISH (MRN [id number]) as of 02/02/2014 14:19  Ref. Range 03/08/2013 15:26 03/29/2013 13:45 12/08/2013 11:52 01/18/2014 14:08 01/27/2014 13:31  C difficile by pcr Latest Range: Negative  Positive (AA) Positive (AA) Negative Negative Negative   (14) persistently increased BUN  (15)  Hypertension, on labetalol, cardizem, lisinopril, and furosemide; managed  by Dr. Brigitte Pulse  (16) steroid induced hyperglycemia, on sterlix and 70/30 insulin, followed by Dr. Loanne Drilling and Dr. Brigitte Pulse  (17) hypogammaglobulinemia-- requiring intermittent supplementation  (18) squamous cell CA in situ removed from left parietal scalp October 2014, second lesion to be removed may 2015   PLAN: We spent approximately 45 minutes today going over the material sent over from the [place] and their recommendations. We have made the following changes:  His prednisone is going to stay at 5 mg in the morning and 2 point 5 in the evening. We are dropping however very slowly the tacrolimus from 1.5 mg twice a day to 1.5 mg in the morning and 1.0 mg in the evening. We will start this next Monday so if diarrhea develops they will be able to contact us immediately. We are stopping the zinc and iron supplementation. We are not starting vitamin D 50,000 units weekly at this point but we will check a vitamin D level before the next visit. We will continue the weekly magnesium and fluid supplementation likely until the tacrolimus is completely removed. We are stopping the vancomycin. We are not proceeding with vaccinations at this point per [place]'s recommendation. They will review this again at the next visit there.  On the other hand we are adding Questran to be taken once a day.  Jhamir has a good understanding of this plan. He agrees with it. He knows a goal of treatment in his case is cure of the cancer and control of the graft-versus-host disease. He will call with any problems that may develop before his next visit here.    Chauncey Cruel, MD 04/27/2014

## 2014-05-03 ENCOUNTER — Other Ambulatory Visit: Payer: Self-pay | Admitting: Oncology

## 2014-05-04 ENCOUNTER — Other Ambulatory Visit: Payer: Self-pay | Admitting: *Deleted

## 2014-05-04 ENCOUNTER — Ambulatory Visit (HOSPITAL_BASED_OUTPATIENT_CLINIC_OR_DEPARTMENT_OTHER): Payer: Medicare Other

## 2014-05-04 ENCOUNTER — Other Ambulatory Visit (HOSPITAL_BASED_OUTPATIENT_CLINIC_OR_DEPARTMENT_OTHER): Payer: BC Managed Care – PPO

## 2014-05-04 DIAGNOSIS — D809 Immunodeficiency with predominantly antibody defects, unspecified: Secondary | ICD-10-CM

## 2014-05-04 DIAGNOSIS — C9110 Chronic lymphocytic leukemia of B-cell type not having achieved remission: Secondary | ICD-10-CM

## 2014-05-04 DIAGNOSIS — M25551 Pain in right hip: Secondary | ICD-10-CM

## 2014-05-04 DIAGNOSIS — M25552 Pain in left hip: Secondary | ICD-10-CM

## 2014-05-04 DIAGNOSIS — M549 Dorsalgia, unspecified: Secondary | ICD-10-CM

## 2014-05-04 DIAGNOSIS — Z9489 Other transplanted organ and tissue status: Secondary | ICD-10-CM

## 2014-05-04 DIAGNOSIS — G8929 Other chronic pain: Secondary | ICD-10-CM

## 2014-05-04 LAB — COMPREHENSIVE METABOLIC PANEL (CC13)
ALK PHOS: 67 U/L (ref 40–150)
ALT: 36 U/L (ref 0–55)
AST: 24 U/L (ref 5–34)
Albumin: 3.7 g/dL (ref 3.5–5.0)
Anion Gap: 12 mEq/L — ABNORMAL HIGH (ref 3–11)
BILIRUBIN TOTAL: 0.27 mg/dL (ref 0.20–1.20)
BUN: 47.3 mg/dL — ABNORMAL HIGH (ref 7.0–26.0)
CO2: 17 mEq/L — ABNORMAL LOW (ref 22–29)
CREATININE: 1.1 mg/dL (ref 0.7–1.3)
Calcium: 9.9 mg/dL (ref 8.4–10.4)
Chloride: 111 mEq/L — ABNORMAL HIGH (ref 98–109)
Glucose: 164 mg/dl — ABNORMAL HIGH (ref 70–140)
Potassium: 5.4 mEq/L — ABNORMAL HIGH (ref 3.5–5.1)
Sodium: 140 mEq/L (ref 136–145)
Total Protein: 6.1 g/dL — ABNORMAL LOW (ref 6.4–8.3)

## 2014-05-04 LAB — CBC WITH DIFFERENTIAL/PLATELET
BASO%: 0.5 % (ref 0.0–2.0)
Basophils Absolute: 0 10*3/uL (ref 0.0–0.1)
EOS%: 0.7 % (ref 0.0–7.0)
Eosinophils Absolute: 0.1 10*3/uL (ref 0.0–0.5)
HCT: 39.4 % (ref 38.4–49.9)
HEMOGLOBIN: 12.9 g/dL — AB (ref 13.0–17.1)
LYMPH%: 28.8 % (ref 14.0–49.0)
MCH: 30.8 pg (ref 27.2–33.4)
MCHC: 32.7 g/dL (ref 32.0–36.0)
MCV: 94.1 fL (ref 79.3–98.0)
MONO#: 0.8 10*3/uL (ref 0.1–0.9)
MONO%: 10.3 % (ref 0.0–14.0)
NEUT#: 4.6 10*3/uL (ref 1.5–6.5)
NEUT%: 59.7 % (ref 39.0–75.0)
Platelets: 210 10*3/uL (ref 140–400)
RBC: 4.19 10*6/uL — ABNORMAL LOW (ref 4.20–5.82)
RDW: 15 % — AB (ref 11.0–14.6)
WBC: 7.7 10*3/uL (ref 4.0–10.3)
lymph#: 2.2 10*3/uL (ref 0.9–3.3)

## 2014-05-04 LAB — MAGNESIUM (CC13): Magnesium: 1.8 mg/dl (ref 1.5–2.5)

## 2014-05-04 MED ORDER — SODIUM CHLORIDE 0.9 % IV SOLN
Freq: Once | INTRAVENOUS | Status: AC
  Administered 2014-05-04: 15:00:00 via INTRAVENOUS
  Filled 2014-05-04: qty 500

## 2014-05-04 MED ORDER — SODIUM CHLORIDE 0.9 % IV SOLN
INTRAVENOUS | Status: DC
  Administered 2014-05-04: 15:00:00 via INTRAVENOUS

## 2014-05-04 MED ORDER — HEPARIN SOD (PORK) LOCK FLUSH 100 UNIT/ML IV SOLN
500.0000 [IU] | Freq: Once | INTRAVENOUS | Status: AC
  Administered 2014-05-04: 500 [IU] via INTRAVENOUS
  Filled 2014-05-04: qty 5

## 2014-05-04 MED ORDER — HYDROCODONE-ACETAMINOPHEN 10-325 MG PO TABS: 1.0000 | ORAL_TABLET | Freq: Four times a day (QID) | ORAL | Status: DC | PRN

## 2014-05-04 MED ORDER — SODIUM CHLORIDE 0.9 % IJ SOLN
10.0000 mL | INTRAMUSCULAR | Status: DC | PRN
  Administered 2014-05-04: 10 mL via INTRAVENOUS
  Filled 2014-05-04: qty 10

## 2014-05-05 ENCOUNTER — Other Ambulatory Visit: Payer: Self-pay | Admitting: Physician Assistant

## 2014-05-05 LAB — IGG, IGA, IGM
IgA: <6 mg/dL — ABNORMAL LOW (ref 68–379)
IgG (Immunoglobin G), Serum: 355 mg/dL — ABNORMAL LOW (ref 650–1600)
IgM, Serum: <4 mg/dL — ABNORMAL LOW (ref 41–251)

## 2014-05-08 ENCOUNTER — Telehealth: Payer: Self-pay | Admitting: *Deleted

## 2014-05-08 ENCOUNTER — Telehealth: Payer: Self-pay | Admitting: Oncology

## 2014-05-08 NOTE — Telephone Encounter (Signed)
Per staff message and POF I have scheduled appts.  JMW  

## 2014-05-08 NOTE — Telephone Encounter (Signed)
s.w.l pt adn advised on May ivig added .Marland Kitchen..pt ok and aware

## 2014-05-11 ENCOUNTER — Other Ambulatory Visit: Payer: Self-pay | Admitting: Physician Assistant

## 2014-05-11 ENCOUNTER — Ambulatory Visit (HOSPITAL_BASED_OUTPATIENT_CLINIC_OR_DEPARTMENT_OTHER): Payer: BC Managed Care – PPO | Admitting: Oncology

## 2014-05-11 ENCOUNTER — Other Ambulatory Visit (HOSPITAL_BASED_OUTPATIENT_CLINIC_OR_DEPARTMENT_OTHER): Payer: BC Managed Care – PPO

## 2014-05-11 ENCOUNTER — Ambulatory Visit (HOSPITAL_BASED_OUTPATIENT_CLINIC_OR_DEPARTMENT_OTHER): Payer: BC Managed Care – PPO

## 2014-05-11 DIAGNOSIS — E099 Drug or chemical induced diabetes mellitus without complications: Secondary | ICD-10-CM

## 2014-05-11 DIAGNOSIS — Z9489 Other transplanted organ and tissue status: Secondary | ICD-10-CM

## 2014-05-11 DIAGNOSIS — D899 Disorder involving the immune mechanism, unspecified: Secondary | ICD-10-CM

## 2014-05-11 DIAGNOSIS — T380X5A Adverse effect of glucocorticoids and synthetic analogues, initial encounter: Secondary | ICD-10-CM

## 2014-05-11 DIAGNOSIS — G8929 Other chronic pain: Secondary | ICD-10-CM

## 2014-05-11 DIAGNOSIS — M549 Dorsalgia, unspecified: Secondary | ICD-10-CM

## 2014-05-11 DIAGNOSIS — C9110 Chronic lymphocytic leukemia of B-cell type not having achieved remission: Secondary | ICD-10-CM

## 2014-05-11 DIAGNOSIS — D849 Immunodeficiency, unspecified: Secondary | ICD-10-CM

## 2014-05-11 DIAGNOSIS — M810 Age-related osteoporosis without current pathological fracture: Secondary | ICD-10-CM

## 2014-05-11 DIAGNOSIS — D839 Common variable immunodeficiency, unspecified: Secondary | ICD-10-CM

## 2014-05-11 DIAGNOSIS — D89811 Chronic graft-versus-host disease: Secondary | ICD-10-CM

## 2014-05-11 DIAGNOSIS — T8609 Other complications of bone marrow transplant: Secondary | ICD-10-CM

## 2014-05-11 LAB — COMPREHENSIVE METABOLIC PANEL (CC13)
ALT: 59 U/L — ABNORMAL HIGH (ref 0–55)
AST: 24 U/L (ref 5–34)
Albumin: 3.7 g/dL (ref 3.5–5.0)
Alkaline Phosphatase: 70 U/L (ref 40–150)
Anion Gap: 9 mEq/L (ref 3–11)
BUN: 39.1 mg/dL — ABNORMAL HIGH (ref 7.0–26.0)
CALCIUM: 8.9 mg/dL (ref 8.4–10.4)
CO2: 17 mEq/L — ABNORMAL LOW (ref 22–29)
Chloride: 115 mEq/L — ABNORMAL HIGH (ref 98–109)
Creatinine: 1.1 mg/dL (ref 0.7–1.3)
GLUCOSE: 178 mg/dL — AB (ref 70–140)
POTASSIUM: 4.4 meq/L (ref 3.5–5.1)
SODIUM: 140 meq/L (ref 136–145)
Total Bilirubin: 0.25 mg/dL (ref 0.20–1.20)
Total Protein: 5.9 g/dL — ABNORMAL LOW (ref 6.4–8.3)

## 2014-05-11 LAB — CBC WITH DIFFERENTIAL/PLATELET
BASO%: 0.4 % (ref 0.0–2.0)
Basophils Absolute: 0 10*3/uL (ref 0.0–0.1)
EOS%: 0.4 % (ref 0.0–7.0)
Eosinophils Absolute: 0 10*3/uL (ref 0.0–0.5)
HCT: 39.9 % (ref 38.4–49.9)
HGB: 12.8 g/dL — ABNORMAL LOW (ref 13.0–17.1)
LYMPH%: 34.2 % (ref 14.0–49.0)
MCH: 30.3 pg (ref 27.2–33.4)
MCHC: 32 g/dL (ref 32.0–36.0)
MCV: 94.6 fL (ref 79.3–98.0)
MONO#: 0.6 10*3/uL (ref 0.1–0.9)
MONO%: 8.4 % (ref 0.0–14.0)
NEUT%: 56.6 % (ref 39.0–75.0)
NEUTROS ABS: 4.4 10*3/uL (ref 1.5–6.5)
PLATELETS: 210 10*3/uL (ref 140–400)
RBC: 4.22 10*6/uL (ref 4.20–5.82)
RDW: 15.5 % — ABNORMAL HIGH (ref 11.0–14.6)
WBC: 7.7 10*3/uL (ref 4.0–10.3)
lymph#: 2.6 10*3/uL (ref 0.9–3.3)

## 2014-05-11 LAB — MAGNESIUM (CC13): Magnesium: 1.9 mg/dl (ref 1.5–2.5)

## 2014-05-11 MED ORDER — SODIUM CHLORIDE 0.9 % IJ SOLN
10.0000 mL | INTRAMUSCULAR | Status: DC | PRN
  Administered 2014-05-11: 10 mL via INTRAVENOUS
  Filled 2014-05-11: qty 10

## 2014-05-11 MED ORDER — IMMUNE GLOBULIN (HUMAN) 2 GM/20ML IV SOLN
90.0000 g | Freq: Once | INTRAVENOUS | Status: DC
  Filled 2014-05-11: qty 900

## 2014-05-11 MED ORDER — IMMUNE GLOBULIN (HUMAN) 10 GM/100ML IV SOLN
90.0000 g | Freq: Once | INTRAVENOUS | Status: AC
  Administered 2014-05-11: 90 g via INTRAVENOUS
  Filled 2014-05-11: qty 900

## 2014-05-11 MED ORDER — HEPARIN SOD (PORK) LOCK FLUSH 100 UNIT/ML IV SOLN
500.0000 [IU] | Freq: Once | INTRAVENOUS | Status: AC
Start: 2014-05-11 — End: 2014-05-11
  Administered 2014-05-11: 500 [IU] via INTRAVENOUS
  Filled 2014-05-11: qty 5

## 2014-05-11 MED ORDER — SODIUM CHLORIDE 0.9 % IV SOLN
Freq: Once | INTRAVENOUS | Status: AC
  Administered 2014-05-11: 13:00:00 via INTRAVENOUS
  Filled 2014-05-11: qty 500

## 2014-05-11 MED ORDER — IMMUNE GLOBULIN (HUMAN) 10 GM/200ML IV SOLN: 100.0000 g | Freq: Once | INTRAVENOUS | Status: DC

## 2014-05-11 NOTE — Progress Notes (Signed)
ID: Timothy [name]   DOB: [birth year]  MR#: [id number]  WUJ#:[id number]  NFA:OZHY, [name], MD SU: OTHER MD: [name]; [name], MD; [name], [name],MD; [name], MD; [facility] in [place], [place]:  [name], RN [phone])  CHIEF COMPLAINT:  CLL, status post allogeneic stem cell transplant, GVHD TREATMENT: Immunosuppression  HISTORY OF CLL: We have very complete records from Dr. Racheal Patches in [place], and in summary:  The patient was initially diagnosed in August 2000, with a white cell count of 23,600, but normal hemoglobin and platelets, and no significant symptomatology. Over the next several years his white cell count drifted up, and he eventually developed some symptoms of night sweats in particular, leading to treatment with fludarabine, Cytoxan and rituxan for five cycles given between December 2006 and May 2007.  We have CT scans from June 2006, November 2006 and April 2007, and comparing the November 2006 and April 2007 scans, there was near complete response. He had subsequent therapy in [place] as detailed below, but with decreased response, leading to allogeneic stem-cell transplant at the [facility] 02/24/2012.  Subsequent history is as detailed below.  INTERVAL HISTORY: Timothy [name] returns today accompanied by his wife Timothy [name] for followup of his chronic lymphoid leukemia status post allogenic transplant. He is very stable, in the middle of teaching a summer course. We continue to do weekly infusions with magnesium and close lab work followup. He continues on significant immunosuppression for graft-versus-host disease  REVIEW OF SYSTEMS: At the last visit we decreased the tacrolimus by 0.5 mg a day and we also stopped the vancomycin. He had one episode of diarrhea about 48 hours ago, with a single very loose bowel movements. Otherwise has been no change in his bowel habits. He still has  significant tremors, of course. He has not noted any skin change or mouth sore. His mobility has improved, and now he is walking up stairs more easily and no longer using the walking poles when walking on a flat surface. There have been no unusual headaches, visual changes, nausea, vomiting, or dizziness. A detailed review of systems today was otherwise stable  PAST MEDICAL HISTORY: Past Medical History  Diagnosis Date  . 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 Diverticular disease   . Hyperlipidemia   . Obesity   . Hypertension   . Hiatal hernia   . CMV (cytomegalovirus) antibody positive     pre-transplant, with seroconversion x2 pst-transplant  . Right bundle branch block     pre-transplant  . CKD (chronic kidney disease) stage 2, GFR 60-89 ml/min   . Pancytopenia   . Steroid-induced diabetes   . Atrial fibrillation     post-transplant  . Myopathy   . 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 Leukemia, chronic lymphoid   . 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 Diarrhea in adult patient 12/05/2012    Due to active GVHD  . CLL (chronic lymphocytic leukemia) 12/05/2012    Dx 07/1999; started Rx 12/06  AlloBMT 3/13  . Rash of face 12/05/2012    Due to GVHD  . Hypomagnesemia 01/26/2013  . Left hip pain 12/01/2013    PAST SURGICAL HISTORY: Past Surgical History  Procedure Laterality Date  . Tonsillectomy and adenoidectomy    . Bone marrow transplant    . Flexible sigmoidoscopy  11/17/2012    Procedure: FLEXIBLE SIGMOIDOSCOPY;  Surgeon:  Jeryl Columbia, MD;  Location: [facility];  Service: Endoscopy;  Laterality: N/A;  Dr Watt Climes states will be admitted to rooom 1339 11/16/12  . Esophagogastroduodenoscopy  11/17/2012    Procedure: ESOPHAGOGASTRODUODENOSCOPY (EGD);  Surgeon: Jeryl Columbia, MD;  Location: [facility];  Service: Endoscopy;  Laterality: N/A;    FAMILY  HISTORY Family History  Problem Relation Age of Onset  . Cancer Father    The patient's father died from complications of chronic lymphocytic leukemia at the age of 68.  It had been diagnosed seven years before when he was 32.  The patient is enrolled in a familial chronic lymphocytic leukemia study out of the [employer].  The patient's mother is 9, alive, unfortunately suffering with dementia, and he has a brother, 69, who is otherwise in fair health.   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  His wife of >40 years, Timothy [name], is a homemaker.  Their daughter, Timothy [name], lives in [place].  She also is a Agricultural engineer.  The patient has an 22 year old grandson and an 70-year-old granddaughter, and that is really the main reason he moved to this area.  He is a Tourist information centre manager.     ADVANCED DIRECTIVES: In place  HEALTH MAINTENANCE: (Updated 04/13/2014) History  Substance Use Topics  . Smoking status: Never Smoker   . Smokeless tobacco: Never Used  . Alcohol Use: No     Colonoscopy: Nov 2013, Dr. Watt Climes  PSA: Not on file  Bone density:  Feb 2014;  Patient also has known insufficiency and pathologic fractures in addition to his long-standing history of steroid use.  Lipid panel: Jan 2015, elevated    Allergies  Allergen Reactions  . Benadryl [Diphenhydramine Hcl]     "Restless leg syndrome"    Current Outpatient Prescriptions  Medication Sig Dispense Refill  . acyclovir (ZOVIRAX) 400 MG tablet Take 2 tablets (800 mg total) by mouth 2 (two) times daily.  120 tablet  3  . budesonide (ENTOCORT EC) 3 MG 24 hr capsule Take 1 capsule (3 mg total) by mouth 3 (three) times daily.  90 capsule  6  . cholecalciferol (VITAMIN D) 1000 UNITS tablet Take 1,000 Units by mouth every evening.       . cholestyramine (QUESTRAN) 4 G packet Take 1 packet (4 g  total) by mouth daily.  30 each  12  . diltiazem (CARDIZEM CD) 240 MG 24 hr capsule       . diltiazem (CARDIZEM CD) 240 MG 24 hr capsule TAKE 1 CAPSULE BY MOUTH DAILY  30 capsule  3  . diltiazem (DILACOR XR) 240 MG 24 hr capsule Take 1 capsule (240 mg total) by mouth every evening.  30 capsule  5  . fluconazole (DIFLUCAN) 100 MG tablet Take 1 tablet (100 mg total) by mouth daily.  30 tablet  6  . furosemide (LASIX) 20 MG tablet Take 1 tablet (20 mg total) by mouth daily.  30 tablet  5  . glucose blood (ONE TOUCH ULTRA TEST) test strip Test before meals and at bedtime.  300 each  0  . HYDROcodone-acetaminophen (NORCO) 10-325 MG per tablet Take 1-2 tablets by mouth every 6 (six) hours as needed for moderate pain.  120 tablet  0  . Insulin Aspart Prot & Aspart (70-30) 100 UNIT/ML SUPN Inject 10 Units into the skin daily with breakfast.       .  Insulin Pen Needle (B-D UF III MINI PEN NEEDLES) 31G X 5 MM MISC Use twice daily with insulin as directed  100 each  5  . labetalol (NORMODYNE) 200 MG tablet Take 400 mg by mouth 2 (two) times daily.       . Lidocaine-Hydrocortisone Ace 3-0.5 % KIT Apply 1 application topically as needed (for pain).  1 each  3  . lidocaine-prilocaine (EMLA) cream Apply 1 application topically as needed. 1-2 hrs before each port access  30 g  6  . lisinopril (PRINIVIL,ZESTRIL) 10 MG tablet Take 10 mg by mouth every evening.       . loratadine (CLARITIN) 10 MG tablet Take 10 mg by mouth daily.      . Multiple Vitamin (MULTIVITAMIN WITH MINERALS) TABS tablet Take 1 tablet by mouth daily.      Marland Kitchen omeprazole (PRILOSEC) 20 MG capsule Take 1 capsule (20 mg total) by mouth daily.  30 capsule  5  . ONETOUCH DELICA LANCETS 37S MISC Test before meals and at bedtime.  300 each  0  . predniSONE (DELTASONE) 5 MG tablet Take 0.5-1 tablets (2.5-5 mg total) by mouth 2 (two) times daily. Takes 42m in the morning and 2.544min the evening.  45 tablet  2  . sertraline (ZOLOFT) 50 MG tablet ALTERNATE  TAKING 1 TABLET DAILY AND 2 TABLETS DAILY  90 tablet  5  . sulfamethoxazole-trimethoprim (BACTRIM DS) 800-160 MG per tablet Take 1 tablet by mouth daily.  30 tablet  5  . tacrolimus (PROGRAF) 0.5 MG capsule TAKE 3 CAPSULES BY MOUTH TWO TIMES DAILY  180 capsule  3  . VOLTAREN 1 % GEL Apply 2 g topically 2 (two) times daily. Applied to back      . [DISCONTINUED] insulin aspart (NOVOLOG FLEXPEN) 100 UNIT/ML SOPN FlexPen 18units sq qam, 9units sq qpm, or as directed  15 mL  1   No current facility-administered medications for this visit.   Facility-Administered Medications Ordered in Other Visits  Medication Dose Route Frequency Provider Last Rate Last Dose  . 0.9 %  sodium chloride infusion   Intravenous Continuous Amy G Berry, PA-C 500 mL/hr at 03/12/13 0900    . sodium chloride 0.9 % injection 10 mL  10 mL Intravenous PRN GuChauncey CruelMD   10 mL at 08/11/12 1606    OBJECTIVE: Middle-aged white man in no acute distress   There were no vitals filed for this visit.There is no weight on file to calculate BMI.  ECOG: 1 There were no vitals filed for this visit. Vitals - 1 value per visit 04/22/82/1517SYSTOLIC 16616DIASTOLIC 74  Pulse 66  Temperature 98  Respirations 20  Weight (lb)   Height   BMI   VISIT REPORT     Sclerae unicteric, EOMs intact, pupils round and reactive Oropharynx clear and moist-- no thrush or other lesions No cervical or supraclavicular adenopathy Lungs no rales or rhonchi Heart regular rate and rhythm Abd soft, nontender, positive bowel sounds, no splenomegaly MSK no focal spinal tenderness, bilateral ankle edema, grade 1, stable Neuro: nonfocal, well oriented, positive affect Skin:  but no rash or other evidence of active graft-versus-host diisease   LABS:  CBC    Component Value Date/Time   WBC 7.7 05/11/2014 1116   WBC 7.1 04/06/2014 0949   RBC 4.22 05/11/2014 1116   RBC 4.06* 04/06/2014 0949   RBC 3.82* 03/16/2013 1400   HGB 12.8* 05/11/2014 1116    HGB 12.2*  04/06/2014 0949   HCT 39.9 05/11/2014 1116   HCT 38.5* 04/06/2014 0949   PLT 210 05/11/2014 1116   PLT 144* 04/06/2014 0949   MCV 94.6 05/11/2014 1116   MCV 94.8 04/06/2014 0949   MCH 30.3 05/11/2014 1116   MCH 30.0 04/06/2014 0949   MCHC 32.0 05/11/2014 1116   MCHC 31.7 04/06/2014 0949   RDW 15.5* 05/11/2014 1116   RDW 15.7* 04/06/2014 0949   LYMPHSABS 2.6 05/11/2014 1116   LYMPHSABS 1.4 03/18/2013 0615   MONOABS 0.6 05/11/2014 1116   MONOABS 0.3 03/18/2013 0615   EOSABS 0.0 05/11/2014 1116   EOSABS 0.0 03/18/2013 0615   BASOSABS 0.0 05/11/2014 1116   BASOSABS 0.0 03/18/2013 0615        Chemistry      Component Value Date/Time   NA 140 05/11/2014 1116   NA 131* 06/14/2013 0800   K 4.4 05/11/2014 1116   K 4.7 06/14/2013 0800   CL 101 06/15/2013 1034   CL 97 06/14/2013 0800   CO2 17* 05/11/2014 1116   CO2 24 06/14/2013 0800   BUN 39.1* 05/11/2014 1116   BUN 38* 06/14/2013 0800   CREATININE 1.1 05/11/2014 1116   CREATININE 0.97 06/14/2013 0800      Component Value Date/Time   CALCIUM 8.9 05/11/2014 1116   CALCIUM 9.1 06/14/2013 0800   ALKPHOS 70 05/11/2014 1116   ALKPHOS 75 04/18/2013 1218   AST 24 05/11/2014 1116   AST 24 04/18/2013 1218   ALT 59* 05/11/2014 1116   ALT 33 04/18/2013 1218   BILITOT 0.25 05/11/2014 1116   BILITOT 0.2* 04/18/2013 1218    Results for WHALEN, TROMPETER (MRN [id number]) as of 05/13/2014 12:26  Ref. Range 01/26/2014 10:43 02/09/2014 11:26 03/23/2014 12:22 04/13/2014 14:50 05/04/2014 14:03  IgG (Immunoglobin G), Serum Latest Range: 508 481 1990 mg/dL 375 (L) 320 (L) 964 471 (L) 355 (L)  Results for EVON, LOPEZPEREZ (MRN [id number]) as of 05/13/2014 12:26  Ref. Range 11/26/2012 04:30 12/13/2012 16:25 12/20/2012 05:00 12/27/2012 04:40 03/16/2013 14:10  Tacrolimus (FK506) - [facility] No range found 5.6 6.1 ng/mL 10.0 21.2 6.1   STUDIES: No results found.  ASSESSMENT: 68 y.o.  man with a history of well-differentiated lymphocytic lymphoma/ chronic lymphoid leukemia initially  diagnosed in 2000, not requiring intervention until 2006; with multiple chromosomal abnormalities.  His treatment history is as follows:  (1) fludarabine/cyclophosphamide/rituximab x5 completed May 2007.   (2) rituximab for 8 doses October 2010, with partial response   (3) Leustatin and ofatumumab weekly x8 July to September 2011 followed by maintenance ofatumumab  every 2 months, with initial response but rising counts September 2012   (4) status-post unrelated donor stem-cell transplant 02/24/2012 at [facility]  (a) conditioning regimen consisted of fludarabine + TBI at 200 cGy, followed by rituximab x27;  (b) CMV reactivation x3 (patient CMV positive, donor negative), s/p ganciclovir treatment; 3d reactivation August 2013, s/p gancyclovir, with negative PCR mid-September 2013; last gancyclovir dose 10/06/2012 (c) Chronic GVHD: involving gut and skin, treated with steroids, tacrolimus and MMF.  MMF was eventually d/c'd and tacrolimus currently at a dose of 1.10m BID (d) atrial fibrillation: resolved on brief amiodarone regimen (e) steroid-induced myopathy: improving  (f) hypomagnesemia: improved after d/c gancyclovir, needs continuing support (g) hypogammaglobulinemia: requiring IVIG most recently 03/15/2014. (h) history of elevated triglycerides (606 on 07/14/2012)  (i) adrenal insufficiency: on prednisone and budesonide (j) pancytopenia,resolved (k) brief episode of neutropenia ([id number]) February 2015, accompanied by diarrhea; resolved   (5) restaging studies including CT  scans, flow cytometry, and bone marrow biopsy, showed no evidence of residual chronic lymphoid leukemia.  (6) recurrent GVHD (skin rash, mouth changes, severe diarrhea and gastric/duodenal/colonic biopsies 11/17/2012 c/w GVHD grade 2) : now at most grade 1 GI with no significant skin or mouth changes  (7)  malnutrition -- on VITAL supplement in addition to regular diet; on Marinol for anorexia  (8) testosterone  deficiency--on patch   (9) deconditioning: Especially quad weakness  (10) mild dehydration: encouraged increased po fluids; receives IVF support w magnesium weekly  (11) severe steroid-induced osteoporosis with compression fractures: received pamidronate 12/18/2012. Status post kyphoplasty at L3-4 in June 2014. Also with evidence of rib fractures and insufficiency fractures bilaterally of the sacral  alae, noted by CT in March 2015. --   Denosumab started 12/08/2013, given as prolia Q6 months which is what has been approved by his insurance  (12) chronic back pain and hip pain controlled with OxyContin and hydrocodone/APAP.  (12) nausea: well controlled on current meds  (13)  Positive c.diff, 03/08/2013, on Flagyl 500 mg TID x 20 days, then on oral vanco with Questran, showing improvement; positive when repeated April 2014; Negative x 3   Results for BRACH, BIRDSALL (MRN [id number]) as of 02/02/2014 14:19  Ref. Range 03/08/2013 15:26 03/29/2013 13:45 12/08/2013 11:52 01/18/2014 14:08 01/27/2014 13:31  C difficile by pcr Latest Range: Negative  Positive (AA) Positive (AA) Negative Negative Negative   (14) persistently increased BUN  (15)  Hypertension, on labetalol, cardizem, lisinopril, and furosemide; managed by Dr. Brigitte Pulse  (16) steroid induced hyperglycemia, on sterlix and 70/30 insulin, followed by Dr. Loanne Drilling and Dr. Brigitte Pulse  (17) hypogammaglobulinemia-- requiring intermittent supplementation  (18) squamous cell CA in situ removed from left parietal scalp October 2014, second lesion to be removed may 2015   PLAN: Aldred is doing well as far as his chronic lymphoid leukemia is concerned and as far as his multiple problems relating to being status post transplant. His diarrhea has not recurred although we stopped his vancomycin and also decreased he is tacrolimus and not at the last visit.  We are going to drop of the tacrolimus further at this point. He will be on 2 tablets in the morning and 2  in the evening. I do not plan to make any further changes for a month but if everything continues well at that time we will again drop one of the nighttime dose tablets. The goal is to go off the tacrolimus at some point this year. Hopefully at that point his magnesium will level out and he will not need the weekly infusions he has been receiving.  His IgG level is low and he will receive IVIG replacement today in addition to the magnesium and fluids.  Dixon has a good understanding of the overall plan. He agrees with it. He knows a goal of treatment in his case is cure. He will call with any problems that may develop before his next visit here.   Chauncey Cruel, MD 05/13/2014

## 2014-05-11 NOTE — Patient Instructions (Signed)

## 2014-05-12 ENCOUNTER — Telehealth: Payer: Self-pay | Admitting: Oncology

## 2014-05-12 NOTE — Telephone Encounter (Signed)
per pof sch appt-gave pt copy of sch °

## 2014-05-18 ENCOUNTER — Other Ambulatory Visit (HOSPITAL_BASED_OUTPATIENT_CLINIC_OR_DEPARTMENT_OTHER): Payer: BC Managed Care – PPO

## 2014-05-18 ENCOUNTER — Ambulatory Visit (HOSPITAL_BASED_OUTPATIENT_CLINIC_OR_DEPARTMENT_OTHER): Payer: BC Managed Care – PPO

## 2014-05-18 DIAGNOSIS — C9110 Chronic lymphocytic leukemia of B-cell type not having achieved remission: Secondary | ICD-10-CM

## 2014-05-18 LAB — CBC WITH DIFFERENTIAL/PLATELET
BASO%: 0.7 % (ref 0.0–2.0)
Basophils Absolute: 0.1 10*3/uL (ref 0.0–0.1)
EOS%: 0.7 % (ref 0.0–7.0)
Eosinophils Absolute: 0.1 10*3/uL (ref 0.0–0.5)
HCT: 40.1 % (ref 38.4–49.9)
HGB: 12.9 g/dL — ABNORMAL LOW (ref 13.0–17.1)
LYMPH%: 33.5 % (ref 14.0–49.0)
MCH: 30.3 pg (ref 27.2–33.4)
MCHC: 32.2 g/dL (ref 32.0–36.0)
MCV: 94 fL (ref 79.3–98.0)
MONO#: 0.6 10*3/uL (ref 0.1–0.9)
MONO%: 8.8 % (ref 0.0–14.0)
NEUT#: 4.1 10*3/uL (ref 1.5–6.5)
NEUT%: 56.3 % (ref 39.0–75.0)
PLATELETS: 173 10*3/uL (ref 140–400)
RBC: 4.26 10*6/uL (ref 4.20–5.82)
RDW: 15.3 % — ABNORMAL HIGH (ref 11.0–14.6)
WBC: 7.2 10*3/uL (ref 4.0–10.3)
lymph#: 2.4 10*3/uL (ref 0.9–3.3)

## 2014-05-18 LAB — COMPREHENSIVE METABOLIC PANEL (CC13)
ALT: 39 U/L (ref 0–55)
AST: 29 U/L (ref 5–34)
Albumin: 3.7 g/dL (ref 3.5–5.0)
Alkaline Phosphatase: 65 U/L (ref 40–150)
Anion Gap: 9 mEq/L (ref 3–11)
BILIRUBIN TOTAL: 0.29 mg/dL (ref 0.20–1.20)
BUN: 45.4 mg/dL — ABNORMAL HIGH (ref 7.0–26.0)
CHLORIDE: 112 meq/L — AB (ref 98–109)
CO2: 16 mEq/L — ABNORMAL LOW (ref 22–29)
CREATININE: 1.3 mg/dL (ref 0.7–1.3)
Calcium: 9.1 mg/dL (ref 8.4–10.4)
Glucose: 158 mg/dl — ABNORMAL HIGH (ref 70–140)
Potassium: 5.1 mEq/L (ref 3.5–5.1)
Sodium: 137 mEq/L (ref 136–145)
Total Protein: 6.8 g/dL (ref 6.4–8.3)

## 2014-05-18 LAB — MAGNESIUM (CC13): Magnesium: 1.6 mg/dl (ref 1.5–2.5)

## 2014-05-18 MED ORDER — HEPARIN SOD (PORK) LOCK FLUSH 100 UNIT/ML IV SOLN
500.0000 [IU] | Freq: Once | INTRAVENOUS | Status: AC
  Administered 2014-05-18: 500 [IU] via INTRAVENOUS
  Filled 2014-05-18: qty 5

## 2014-05-18 MED ORDER — SODIUM CHLORIDE 0.9 % IV SOLN
Freq: Once | INTRAVENOUS | Status: AC
  Administered 2014-05-18: 14:00:00 via INTRAVENOUS
  Filled 2014-05-18: qty 500

## 2014-05-18 MED ORDER — SODIUM CHLORIDE 0.9 % IJ SOLN
10.0000 mL | INTRAMUSCULAR | Status: DC | PRN
  Administered 2014-05-18: 10 mL via INTRAVENOUS
  Filled 2014-05-18: qty 10

## 2014-05-25 ENCOUNTER — Ambulatory Visit (HOSPITAL_BASED_OUTPATIENT_CLINIC_OR_DEPARTMENT_OTHER): Payer: BC Managed Care – PPO

## 2014-05-25 ENCOUNTER — Ambulatory Visit (HOSPITAL_BASED_OUTPATIENT_CLINIC_OR_DEPARTMENT_OTHER): Payer: BC Managed Care – PPO | Admitting: Physician Assistant

## 2014-05-25 ENCOUNTER — Other Ambulatory Visit (HOSPITAL_BASED_OUTPATIENT_CLINIC_OR_DEPARTMENT_OTHER): Payer: BC Managed Care – PPO

## 2014-05-25 ENCOUNTER — Encounter: Payer: Self-pay | Admitting: Physician Assistant

## 2014-05-25 ENCOUNTER — Other Ambulatory Visit: Payer: Self-pay

## 2014-05-25 VITALS — BP 163/82 | HR 73 | Temp 98.1°F | Resp 18 | Ht 66.0 in | Wt 214.7 lb

## 2014-05-25 DIAGNOSIS — D801 Nonfamilial hypogammaglobulinemia: Secondary | ICD-10-CM

## 2014-05-25 DIAGNOSIS — Z9489 Other transplanted organ and tissue status: Secondary | ICD-10-CM

## 2014-05-25 DIAGNOSIS — C9110 Chronic lymphocytic leukemia of B-cell type not having achieved remission: Secondary | ICD-10-CM

## 2014-05-25 DIAGNOSIS — D649 Anemia, unspecified: Secondary | ICD-10-CM

## 2014-05-25 DIAGNOSIS — D809 Immunodeficiency with predominantly antibody defects, unspecified: Secondary | ICD-10-CM

## 2014-05-25 DIAGNOSIS — D89811 Chronic graft-versus-host disease: Secondary | ICD-10-CM

## 2014-05-25 DIAGNOSIS — E099 Drug or chemical induced diabetes mellitus without complications: Secondary | ICD-10-CM

## 2014-05-25 DIAGNOSIS — T380X5A Adverse effect of glucocorticoids and synthetic analogues, initial encounter: Secondary | ICD-10-CM

## 2014-05-25 DIAGNOSIS — T8609 Other complications of bone marrow transplant: Secondary | ICD-10-CM

## 2014-05-25 DIAGNOSIS — M810 Age-related osteoporosis without current pathological fracture: Secondary | ICD-10-CM

## 2014-05-25 DIAGNOSIS — E278 Other specified disorders of adrenal gland: Secondary | ICD-10-CM

## 2014-05-25 DIAGNOSIS — G252 Other specified forms of tremor: Secondary | ICD-10-CM

## 2014-05-25 DIAGNOSIS — R21 Rash and other nonspecific skin eruption: Secondary | ICD-10-CM

## 2014-05-25 LAB — CBC WITH DIFFERENTIAL/PLATELET
BASO%: 0.6 % (ref 0.0–2.0)
BASOS ABS: 0 10*3/uL (ref 0.0–0.1)
EOS%: 0.6 % (ref 0.0–7.0)
Eosinophils Absolute: 0 10*3/uL (ref 0.0–0.5)
HEMATOCRIT: 40.9 % (ref 38.4–49.9)
HGB: 13.4 g/dL (ref 13.0–17.1)
LYMPH#: 2.9 10*3/uL (ref 0.9–3.3)
LYMPH%: 37.9 % (ref 14.0–49.0)
MCH: 30.3 pg (ref 27.2–33.4)
MCHC: 32.8 g/dL (ref 32.0–36.0)
MCV: 92.6 fL (ref 79.3–98.0)
MONO#: 0.6 10*3/uL (ref 0.1–0.9)
MONO%: 8.1 % (ref 0.0–14.0)
NEUT#: 4 10*3/uL (ref 1.5–6.5)
NEUT%: 52.8 % (ref 39.0–75.0)
Platelets: 164 10*3/uL (ref 140–400)
RBC: 4.41 10*6/uL (ref 4.20–5.82)
RDW: 15.6 % — ABNORMAL HIGH (ref 11.0–14.6)
WBC: 7.6 10*3/uL (ref 4.0–10.3)

## 2014-05-25 LAB — COMPREHENSIVE METABOLIC PANEL (CC13)
ALT: 52 U/L (ref 0–55)
AST: 36 U/L — ABNORMAL HIGH (ref 5–34)
Albumin: 3.6 g/dL (ref 3.5–5.0)
Alkaline Phosphatase: 68 U/L (ref 40–150)
Anion Gap: 11 mEq/L (ref 3–11)
BUN: 42.2 mg/dL — AB (ref 7.0–26.0)
CALCIUM: 9.3 mg/dL (ref 8.4–10.4)
CHLORIDE: 112 meq/L — AB (ref 98–109)
CO2: 16 meq/L — AB (ref 22–29)
CREATININE: 1.2 mg/dL (ref 0.7–1.3)
Glucose: 126 mg/dl (ref 70–140)
Potassium: 4.8 mEq/L (ref 3.5–5.1)
Sodium: 139 mEq/L (ref 136–145)
Total Bilirubin: 0.33 mg/dL (ref 0.20–1.20)
Total Protein: 6.6 g/dL (ref 6.4–8.3)

## 2014-05-25 LAB — MAGNESIUM (CC13): Magnesium: 1.8 mg/dl (ref 1.5–2.5)

## 2014-05-25 MED ORDER — SODIUM CHLORIDE 0.9 % IV SOLN
Freq: Once | INTRAVENOUS | Status: AC
  Administered 2014-05-25: 13:00:00 via INTRAVENOUS
  Filled 2014-05-25: qty 500

## 2014-05-25 MED ORDER — HEPARIN SOD (PORK) LOCK FLUSH 100 UNIT/ML IV SOLN
500.0000 [IU] | Freq: Once | INTRAVENOUS | Status: AC
  Administered 2014-05-25: 500 [IU] via INTRAVENOUS
  Filled 2014-05-25: qty 5

## 2014-05-25 MED ORDER — SODIUM CHLORIDE 0.9 % IJ SOLN
10.0000 mL | INTRAMUSCULAR | Status: DC | PRN
  Administered 2014-05-25: 10 mL via INTRAVENOUS
  Filled 2014-05-25: qty 10

## 2014-05-25 NOTE — Progress Notes (Signed)
ID: Timothy [name]   DOB: 1946-03-19  MR#: [id number]  DJS#:[id number]  YIF:OYDX, Timothy Saxon, MD SU: OTHER MD: Timothy [name]; Timothy Shin, MD; Timothy [name], Timothy Hatcher,MD; Timothy Rosenthal, MD; Timothy [name] in Timothy [name], [place]:  Timothy Duval, RN [phone])  CHIEF COMPLAINT:  CLL, status post allogeneic stem cell transplant, GVHD TREATMENT: Immunosuppression  HISTORY OF CLL: We have very complete records from Dr. Racheal [name] in [place], and in summary:  The patient was initially diagnosed in August 2000, with a white cell count of 23,600, but normal hemoglobin and platelets, and no significant symptomatology. Over the next several years his white cell count drifted up, and he eventually developed some symptoms of night sweats in particular, leading to treatment with fludarabine, Cytoxan and rituxan for five cycles given between December 2006 and May 2007.  We have CT scans from June 2006, November 2006 and April 2007, and comparing the November 2006 and April 2007 scans, there was near complete response. He had subsequent therapy in [place] as detailed below, but with decreased response, leading to allogeneic stem-cell transplant at the [facility] 02/24/2012.  Subsequent history is as detailed below.  INTERVAL HISTORY: Timothy [name] returns today accompanied by his wife Timothy [name] for followup of his chronic lymphoid leukemia status post allogenic transplant. He has been very stable, and over the last several weeks we have been gradually trying to decrease his dose of tacrolimus. Originally, Timothy [name] was on a dose of 1.5 mg by mouth twice a day.  At his visit on 04/27/2014, this was decreased to 1.5 mg in the morning, and one milligram at night. He seemed to tolerate this decrease well, and it was decreased further when he saw Dr. Jana [name] 2 weeks ago on 05/11/2014. Currently, Timothy [name] is taking to tacrolimus at a dose of 1 mg by mouth twice a  day.  Unfortunately, soon after decreasing to his current dose of tacrolimus, he began to notice an increase rash around his neck and chest area. The area is erythematous, and there are red bumps, but no lesions or vesicles. Fortunately, the rash is isolated to this area and does not affect his face, his extremities, or the rest of his trunk. He's had no mouth ulcers.  He continues off the vancomycin, and has had some increase in bowel frequency. He describes these, however, as small and soft, with the exception of one bout of diarrhea earlier this morning.  Of note, Timothy [name] continues on a low dose of prednisone, 5 mg in the morning, and 2.5 mg in the afternoon.  REVIEW OF SYSTEMS: Timothy [name] has had no fevers, chills, or night sweats. His energy level has improved. He is still ambulating without the assistance of his canes. He feels stronger and more steady on his feet. He has actually gained a little weight which he is keeping an eye on. He is eating and drinking well and denies any nausea or emesis. He's had no change in urinary habits. He denies any abnormal bruising or bleeding, although he does bruise easily. He denies any cough, phlegm production, shortness of breath, peripheral swelling, chest pain, or palpitations. He's had no abnormal headaches, dizziness, weakness, or change in vision. He does have some chronic back pain which seems to be well-controlled, and he denies any additional pain elsewhere today.  A detailed review of systems is otherwise noncontributory and stable.    PAST MEDICAL HISTORY: Past Medical History  Diagnosis Date  . 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  Diverticular disease   . Hyperlipidemia   . Obesity   . Hypertension   . Hiatal hernia   . CMV (cytomegalovirus) antibody positive     pre-transplant, with seroconversion x2 pst-transplant  . Right bundle branch block     pre-transplant  . CKD (chronic kidney disease) stage 2, GFR  60-89 ml/min   . Pancytopenia   . Steroid-induced diabetes   . Atrial fibrillation     post-transplant  . Myopathy   . 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 Leukemia, chronic lymphoid   . 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 Diarrhea in adult patient 12/05/2012    Due to active GVHD  . CLL (chronic lymphocytic leukemia) 12/05/2012    Dx 07/1999; started Rx 12/06  AlloBMT 3/13  . Rash of face 12/05/2012    Due to GVHD  . Hypomagnesemia 01/26/2013  . Left hip pain 12/01/2013    PAST SURGICAL HISTORY: Past Surgical History  Procedure Laterality Date  . Tonsillectomy and adenoidectomy    . Bone marrow transplant    . Flexible sigmoidoscopy  11/17/2012    Procedure: FLEXIBLE SIGMOIDOSCOPY;  Surgeon: Timothy Columbia, MD;  Location: [facility];  Service: Endoscopy;  Laterality: N/A;  Dr Watt Climes states will be admitted to rooom 1339 11/16/12  . Esophagogastroduodenoscopy  11/17/2012    Procedure: ESOPHAGOGASTRODUODENOSCOPY (EGD);  Surgeon: Timothy Columbia, MD;  Location: [facility];  Service: Endoscopy;  Laterality: N/A;    FAMILY HISTORY Family History  Problem Relation Age of Onset  . Cancer Father   The patient's father died from complications of chronic lymphocytic leukemia at the age of 67.  It had been diagnosed seven years before when he was 63.  The patient is enrolled in a familial chronic lymphocytic leukemia study out of the [employer].  The patient's mother is 67, alive, unfortunately suffering with dementia, and he has a brother, 3, who is otherwise in fair health.   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  His wife of >40 years, Timothy [name], is a homemaker.  Their  daughter, Timothy Lull, lives in [place].  She also is a Agricultural engineer.  The patient has an 61 year old grandson and an 40-year-old granddaughter, and that is really the main reason he moved to this area.  He is a Tourist information centre manager.     ADVANCED DIRECTIVES: In place  HEALTH MAINTENANCE: (Updated 04/13/2014) History  Substance Use Topics  . Smoking status: Never Smoker   . Smokeless tobacco: Never Used  . Alcohol Use: No     Colonoscopy: Nov 2013, Dr. Watt Climes  PSA: Not on file  Bone density:  Feb 2014;  Patient also has known insufficiency and pathologic fractures in addition to his long-standing history of steroid use.  Lipid panel: Jan 2015, elevated    Allergies  Allergen Reactions  . Benadryl [Diphenhydramine Hcl]     "Restless leg syndrome"    Current Outpatient Prescriptions  Medication Sig Dispense Refill  . acyclovir (ZOVIRAX) 400 MG tablet Take 2 tablets (800 mg total) by mouth 2 (two) times daily.  120 tablet  3  . budesonide (ENTOCORT EC) 3 MG 24 hr capsule Take 1 capsule (3 mg total) by mouth 3 (three) times daily.  90 capsule  6  . cholecalciferol (VITAMIN D) 1000 UNITS tablet Take 1,000 Units by mouth every evening.       . cholestyramine (QUESTRAN) 4 G packet Take 1 packet (4 g total) by mouth daily.  30 each  12  . diltiazem (CARDIZEM CD) 240 MG 24 hr capsule       . diltiazem (CARDIZEM CD) 240 MG 24 hr capsule TAKE 1 CAPSULE BY MOUTH DAILY  30 capsule  3  . diltiazem (DILACOR XR) 240 MG 24 hr capsule Take 1 capsule (240 mg total) by mouth every evening.  30 capsule  5  . fluconazole (DIFLUCAN) 100 MG tablet Take 1 tablet (100 mg total) by mouth daily.  30 tablet  6  . furosemide (LASIX) 20 MG tablet Take 1 tablet (20 mg total) by mouth daily.  30 tablet  5  . glucose blood (ONE TOUCH ULTRA TEST) test strip Test before meals and at bedtime.  300 each  0  . HYDROcodone-acetaminophen (NORCO) 10-325 MG per tablet Take 1-2 tablets by mouth every 6 (six) hours as needed for moderate  pain.  120 tablet  0  . Insulin Aspart Prot & Aspart (70-30) 100 UNIT/ML SUPN Inject 10 Units into the skin daily with breakfast.       . Insulin Pen Needle (B-D UF III MINI PEN NEEDLES) 31G X 5 MM MISC Use twice daily with insulin as directed  100 each  5  . labetalol (NORMODYNE) 200 MG tablet Take 400 mg by mouth 2 (two) times daily.       . Lidocaine-Hydrocortisone Ace 3-0.5 % KIT Apply 1 application topically as needed (for pain).  1 each  3  . lidocaine-prilocaine (EMLA) cream Apply 1 application topically as needed. 1-2 hrs before each port access  30 g  6  . lisinopril (PRINIVIL,ZESTRIL) 10 MG tablet Take 10 mg by mouth every evening.       . loratadine (CLARITIN) 10 MG tablet Take 10 mg by mouth daily.      . Multiple Vitamin (MULTIVITAMIN WITH MINERALS) TABS tablet Take 1 tablet by mouth daily.      Marland Kitchen omeprazole (PRILOSEC) 20 MG capsule Take 1 capsule (20 mg total) by mouth daily.  30 capsule  5  . ONETOUCH DELICA LANCETS 16X MISC Test before meals and at bedtime.  300 each  0  . predniSONE (DELTASONE) 5 MG tablet Take 0.5-1 tablets (2.5-5 mg total) by mouth 2 (two) times daily. Takes $RemoveBefo'5mg'EwuaQbDqERe$  in the morning and 2.$RemoveBefor'5mg'riiUlHYLNSuf$  in the evening.  45 tablet  2  . sertraline (ZOLOFT) 50 MG tablet ALTERNATE TAKING 1 TABLET DAILY AND 2 TABLETS DAILY  90 tablet  5  . sulfamethoxazole-trimethoprim (BACTRIM DS) 800-160 MG per tablet Take 1 tablet by mouth daily.  30 tablet  5  . tacrolimus (PROGRAF) 0.5 MG capsule TAKE 2 capsules in the morning and 2 in the evening      . VOLTAREN 1 % GEL Apply 2 g topically 2 (two) times daily. Applied to back      . [DISCONTINUED] insulin aspart (NOVOLOG FLEXPEN) 100 UNIT/ML SOPN FlexPen 18units sq qam, 9units sq qpm, or as directed  15 mL  1   No current facility-administered medications for this visit.   Facility-Administered Medications Ordered in Other Visits  Medication Dose Route Frequency Provider Last Rate Last Dose  . 0.9 %  sodium chloride infusion   Intravenous  Continuous Larenz Frasier G Laquanda Bick, PA-C 500 mL/hr at 03/12/13 0900    .  sodium chloride 0.9 % 500 mL with magnesium sulfate 1 g infusion   Intravenous Once Jamale Spangler G Rosenda Geffrard, PA-C      . sodium chloride 0.9 % injection 10 mL  10 mL Intravenous PRN Chauncey Cruel, MD   10 mL at 08/11/12 1606    OBJECTIVE: Middle-aged white man who appears comfortable and is in no acute distress   Filed Vitals:   05/25/14 1131  BP: 163/82  Pulse: 73  Temp: 98.1 F (36.7 C)  Resp: 18  Body mass index is 34.67 kg/(m^2).  ECOG: 1 Filed Weights   05/25/14 1131  Weight: 214 lb 11.2 oz (97.387 kg)   Physical Exam: HEENT:  Sclerae anicteric.  Oropharynx clear, pink, and moist. No evidence of ulcerations or mucositis. No candidiasis. Neck supple, trachea midline.  NODES:  No cervical or supraclavicular lymphadenopathy palpated.  LUNGS:  Clear to auscultation bilaterally.  No crackles, wheezes or rhonchi HEART:  Regular rate and rhythm. No murmur  ABDOMEN:  Soft, nontender.  Positive bowel sounds.  EXTREMITIES:  No peripheral edema.   SKIN:  There is a maculopapular erythematous rash around the neck and upper chest area. There no actual pustules or vesicles noted. There no additional rashes elsewhere. There are scattered ecchymoses, especially on the arms bilaterally. No petechiae noted. No pallor. Skin is warm and dry. NEURO:  Nonfocal. Well oriented.  Positive affect.   LABS:  CBC    Component Value Date/Time   WBC 7.6 05/25/2014 1117   WBC 7.1 04/06/2014 0949   RBC 4.41 05/25/2014 1117   RBC 4.06* 04/06/2014 0949   RBC 3.82* 03/16/2013 1400   HGB 13.4 05/25/2014 1117   HGB 12.2* 04/06/2014 0949   HCT 40.9 05/25/2014 1117   HCT 38.5* 04/06/2014 0949   PLT 164 05/25/2014 1117   PLT 144* 04/06/2014 0949   MCV 92.6 05/25/2014 1117   MCV 94.8 04/06/2014 0949   MCH 30.3 05/25/2014 1117   MCH 30.0 04/06/2014 0949   MCHC 32.8 05/25/2014 1117   MCHC 31.7 04/06/2014 0949   RDW 15.6* 05/25/2014 1117   RDW 15.7* 04/06/2014 0949   LYMPHSABS  2.9 05/25/2014 1117   LYMPHSABS 1.4 03/18/2013 0615   MONOABS 0.6 05/25/2014 1117   MONOABS 0.3 03/18/2013 0615   EOSABS 0.0 05/25/2014 1117   EOSABS 0.0 03/18/2013 0615   BASOSABS 0.0 05/25/2014 1117   BASOSABS 0.0 03/18/2013 0615        Chemistry      Component Value Date/Time   NA 139 05/25/2014 1117   NA 131* 06/14/2013 0800   K 4.8 05/25/2014 1117   K 4.7 06/14/2013 0800   CL 101 06/15/2013 1034   CL 97 06/14/2013 0800   CO2 16* 05/25/2014 1117   CO2 24 06/14/2013 0800   BUN 42.2* 05/25/2014 1117   BUN 38* 06/14/2013 0800   CREATININE 1.2 05/25/2014 1117   CREATININE 0.97 06/14/2013 0800      Component Value Date/Time   CALCIUM 9.3 05/25/2014 1117   CALCIUM 9.1 06/14/2013 0800   ALKPHOS 68 05/25/2014 1117   ALKPHOS 75 04/18/2013 1218   AST 36* 05/25/2014 1117   AST 24 04/18/2013 1218   ALT 52 05/25/2014 1117   ALT 33 04/18/2013 1218   BILITOT 0.33 05/25/2014 1117   BILITOT 0.2* 04/18/2013 1218    Results for Timothy [name], Timothy [name] (MRN [id number]) as of 05/13/2014 12:26  Ref. Range 01/26/2014 10:43 02/09/2014 11:26 03/23/2014 12:22 04/13/2014 14:50 05/04/2014 14:03  IgG (Immunoglobin G), Serum  Latest Range: [phone] mg/dL 375 (L) 320 (L) 964 471 (L) 355 (L)  Results for Timothy [name], Timothy [name] (MRN [id number]) as of 05/13/2014 12:26  Ref. Range 11/26/2012 04:30 12/13/2012 16:25 12/20/2012 05:00 12/27/2012 04:40 03/16/2013 14:10  Tacrolimus (FK506) - [facility] No range found 5.6 6.1 ng/mL 10.0 21.2 6.1     STUDIES: No results found.    ASSESSMENT: 68 y.o.  man with a history of well-differentiated lymphocytic lymphoma/ chronic lymphoid leukemia initially diagnosed in 2000, not requiring intervention until 2006; with multiple chromosomal abnormalities.  His treatment history is as follows:  (1) fludarabine/cyclophosphamide/rituximab x5 completed May 2007.   (2) rituximab for 8 doses October 2010, with partial response   (3) Leustatin and ofatumumab weekly x8 July to September 2011 followed by maintenance ofatumumab   every 2 months, with initial response but rising counts September 2012   (4) status-post unrelated donor stem-cell transplant 02/24/2012 at [name]  (a) conditioning regimen consisted of fludarabine + TBI at 200 cGy, followed by rituximab x27;  (b) CMV reactivation x3 (patient CMV positive, donor negative), s/p ganciclovir treatment; 3d reactivation August 2013, s/p gancyclovir, with negative PCR mid-September 2013; last gancyclovir dose 10/06/2012 (c) Chronic GVHD: involving gut and skin, treated with steroids, tacrolimus and MMF.  MMF was eventually d/c'd and tacrolimus currently at a dose of 1.$RemoveB'5mg'rGdJgKUc$  BID (d) atrial fibrillation: resolved on brief amiodarone regimen (e) steroid-induced myopathy: improving  (f) hypomagnesemia: improved after d/c gancyclovir, needs continuing support (g) hypogammaglobulinemia: requiring IVIG most recently 03/15/2014. (h) history of elevated triglycerides (606 on 07/14/2012)  (i) adrenal insufficiency: on prednisone and budesonide (j) pancytopenia,resolved (k) brief episode of neutropenia (ANC 300) February 2015, accompanied by diarrhea; resolved   (5) restaging studies including CT scans, flow cytometry, and bone marrow biopsy, showed no evidence of residual chronic lymphoid leukemia.  (6) recurrent GVHD (skin rash, mouth changes, severe diarrhea and gastric/duodenal/colonic biopsies 11/17/2012 c/w GVHD grade 2) : now at most grade 1 GI with no significant skin or mouth changes  (7)  malnutrition -- on VITAL supplement in addition to regular diet; on Marinol for anorexia  (8) testosterone deficiency--on patch   (9) deconditioning: Especially quad weakness  (10) mild dehydration: encouraged increased po fluids; receives IVF support w magnesium weekly  (11) severe steroid-induced osteoporosis with compression fractures: received pamidronate 12/18/2012. Status post kyphoplasty at L3-4 in June 2014. Also with evidence of rib fractures and insufficiency  fractures bilaterally of the sacral  alae, noted by CT in March 2015. --   Denosumab started 12/08/2013, given as prolia Q6 months which is what has been approved by his insurance  (12) chronic back pain and hip pain controlled with OxyContin and hydrocodone/APAP.  (12) nausea: well controlled on current meds  (13)  Positive c.diff, 03/08/2013, on Flagyl 500 mg TID x 20 days, then on oral vanco with Questran, showing improvement; positive when repeated April 2014; Negative x 3   Results for Timothy [name], Timothy [name] (MRN [id number]) as of 02/02/2014 14:19  Ref. Range 03/08/2013 15:26 03/29/2013 13:45 12/08/2013 11:52 01/18/2014 14:08 01/27/2014 13:31  C difficile by pcr Latest Range: Negative  Positive (AA) Positive (AA) Negative Negative Negative   (14) persistently increased BUN  (15)  Hypertension, on labetalol, cardizem, lisinopril, and furosemide; managed by Dr. Brigitte Pulse  (16) steroid induced hyperglycemia, on sterlix and 70/30 insulin, followed by Dr. Loanne Drilling and Dr. Brigitte Pulse  (17) hypogammaglobulinemia-- requiring intermittent supplementation  (18) squamous cell CA in situ removed from left parietal scalp October 2014, second lesion to be removed  may 2015   PLAN: Timothy [name] is doing well overall, but it appears as if we may have decreased his dose of tacrolimus a little too quickly for his body to adjust. Dr. Jana [name] has reviewed this case, and agrees to increase the dose once again over the next couple of weeks.   Accordingly, Timothy [name] will go back to the 1.5 mg dose by mouth twice a day for the next 7 days. He will then decrease slightly, once again to a dose of 1.5 mg in the morning, and 1 mg in the evening. He will have been on that dose for approximately 7 days when he sees Dr. Jana [name] in 2 weeks, June 17. They will reassess his rash, and reevaluate his tacrolimus dose at that time. I have also asked Timothy [name] to contact us early next week if the rash does not seem to be resolving. We may need to increase his dose  of prednisone briefly as well.   In the meanwhile, he'll continue to have weekly labs, along with weekly IV fluid and magnesium. We will continue to follow his IgG levels and he will receive IVIG replacement as needed.  The above plan was reviewed in detail with both Coralyn Mark and [place] today, and the information was also given to him in writing today. They both voice their understanding and agreement with this plan, and they know to call with any changes or problems that develop prior to his next scheduled appointment.   Timothy Burrow, PA-C 05/25/2014

## 2014-05-25 NOTE — Patient Instructions (Signed)
Dehydration, Adult  Dehydration means your body does not have as much fluid as it needs. Your kidneys, brain, and heart will not work properly without the right amount of fluids and salt.   HOME CARE   Ask your doctor how to replace body fluid losses (rehydrate).   Drink enough fluids to keep your pee (urine) clear or pale yellow.   Drink small amounts of fluids often if you feel sick to your stomach (nauseous) or throw up (vomit).   Eat like you normally do.   Avoid:   Foods or drinks high in sugar.   Bubbly (carbonated) drinks.   Juice.   Very hot or cold fluids.   Drinks with caffeine.   Fatty, greasy foods.   Alcohol.   Tobacco.   Eating too much.   Gelatin desserts.   Wash your hands to avoid spreading germs (bacteria, viruses).   Only take medicine as told by your doctor.   Keep all doctor visits as told.  GET HELP RIGHT AWAY IF:    You cannot drink something without throwing up.   You get worse even with treatment.   Your vomit has blood in it or looks greenish.   Your poop (stool) has blood in it or looks black and tarry.   You have not peed in 6 to 8 hours.   You pee a small amount of very dark pee.   You have a fever.   You pass out (faint).   You have belly (abdominal) pain that gets worse or stays in one spot (localizes).   You have a rash, stiff neck, or bad headache.   You get easily annoyed, sleepy, or are hard to wake up.   You feel weak, dizzy, or very thirsty.  MAKE SURE YOU:    Understand these instructions.   Will watch your condition.   Will get help right away if you are not doing well or get worse.  Document Released: 10/04/2009 Document Revised: 03/01/2012 Document Reviewed: 07/28/2011  [employer] Patient Information 2014 [employer]

## 2014-05-26 LAB — IGG, IGA, IGM
IgA: <6 mg/dL — ABNORMAL LOW (ref 68–379)
IgG (Immunoglobin G), Serum: 907 mg/dL (ref 650–1600)

## 2014-06-01 ENCOUNTER — Other Ambulatory Visit (HOSPITAL_BASED_OUTPATIENT_CLINIC_OR_DEPARTMENT_OTHER): Payer: BC Managed Care – PPO

## 2014-06-01 ENCOUNTER — Ambulatory Visit (HOSPITAL_BASED_OUTPATIENT_CLINIC_OR_DEPARTMENT_OTHER): Payer: BC Managed Care – PPO

## 2014-06-01 DIAGNOSIS — C9110 Chronic lymphocytic leukemia of B-cell type not having achieved remission: Secondary | ICD-10-CM

## 2014-06-01 DIAGNOSIS — T8609 Other complications of bone marrow transplant: Secondary | ICD-10-CM

## 2014-06-01 DIAGNOSIS — D89811 Chronic graft-versus-host disease: Secondary | ICD-10-CM

## 2014-06-01 DIAGNOSIS — Z9489 Other transplanted organ and tissue status: Secondary | ICD-10-CM

## 2014-06-01 DIAGNOSIS — D809 Immunodeficiency with predominantly antibody defects, unspecified: Secondary | ICD-10-CM

## 2014-06-01 LAB — CBC WITH DIFFERENTIAL/PLATELET
BASO%: 0.5 % (ref 0.0–2.0)
Basophils Absolute: 0 10*3/uL (ref 0.0–0.1)
EOS%: 0.3 % (ref 0.0–7.0)
Eosinophils Absolute: 0 10*3/uL (ref 0.0–0.5)
HCT: 40.2 % (ref 38.4–49.9)
HGB: 13.2 g/dL (ref 13.0–17.1)
LYMPH%: 36.7 % (ref 14.0–49.0)
MCH: 30.4 pg (ref 27.2–33.4)
MCHC: 32.9 g/dL (ref 32.0–36.0)
MCV: 92.5 fL (ref 79.3–98.0)
MONO#: 0.6 10*3/uL (ref 0.1–0.9)
MONO%: 8.3 % (ref 0.0–14.0)
NEUT#: 4 10*3/uL (ref 1.5–6.5)
NEUT%: 54.2 % (ref 39.0–75.0)
PLATELETS: 160 10*3/uL (ref 140–400)
RBC: 4.35 10*6/uL (ref 4.20–5.82)
RDW: 15.1 % — ABNORMAL HIGH (ref 11.0–14.6)
WBC: 7.4 10*3/uL (ref 4.0–10.3)
lymph#: 2.7 10*3/uL (ref 0.9–3.3)

## 2014-06-01 LAB — COMPREHENSIVE METABOLIC PANEL (CC13)
ALBUMIN: 3.7 g/dL (ref 3.5–5.0)
ALK PHOS: 73 U/L (ref 40–150)
ALT: 56 U/L — AB (ref 0–55)
AST: 33 U/L (ref 5–34)
Anion Gap: 7 mEq/L (ref 3–11)
BILIRUBIN TOTAL: 0.33 mg/dL (ref 0.20–1.20)
BUN: 43.4 mg/dL — ABNORMAL HIGH (ref 7.0–26.0)
CO2: 21 mEq/L — ABNORMAL LOW (ref 22–29)
Calcium: 8.7 mg/dL (ref 8.4–10.4)
Chloride: 110 mEq/L — ABNORMAL HIGH (ref 98–109)
Creatinine: 1.2 mg/dL (ref 0.7–1.3)
Glucose: 101 mg/dl (ref 70–140)
POTASSIUM: 4.8 meq/L (ref 3.5–5.1)
Sodium: 138 mEq/L (ref 136–145)
TOTAL PROTEIN: 6.5 g/dL (ref 6.4–8.3)

## 2014-06-01 LAB — MAGNESIUM (CC13): Magnesium: 1.9 mg/dl (ref 1.5–2.5)

## 2014-06-01 LAB — LACTATE DEHYDROGENASE (CC13): LDH: 212 U/L (ref 125–245)

## 2014-06-01 MED ORDER — SODIUM CHLORIDE 0.9 % IV SOLN
Freq: Once | INTRAVENOUS | Status: AC
  Administered 2014-06-01: 14:00:00 via INTRAVENOUS
  Filled 2014-06-01: qty 500

## 2014-06-01 NOTE — Patient Instructions (Signed)
Dehydration, Adult  Dehydration means your body does not have as much fluid as it needs. Your kidneys, brain, and heart will not work properly without the right amount of fluids and salt.   HOME CARE   Ask your doctor how to replace body fluid losses (rehydrate).   Drink enough fluids to keep your pee (urine) clear or pale yellow.   Drink small amounts of fluids often if you feel sick to your stomach (nauseous) or throw up (vomit).   Eat like you normally do.   Avoid:   Foods or drinks high in sugar.   Bubbly (carbonated) drinks.   Juice.   Very hot or cold fluids.   Drinks with caffeine.   Fatty, greasy foods.   Alcohol.   Tobacco.   Eating too much.   Gelatin desserts.   Wash your hands to avoid spreading germs (bacteria, viruses).   Only take medicine as told by your doctor.   Keep all doctor visits as told.  GET HELP RIGHT AWAY IF:    You cannot drink something without throwing up.   You get worse even with treatment.   Your vomit has blood in it or looks greenish.   Your poop (stool) has blood in it or looks black and tarry.   You have not peed in 6 to 8 hours.   You pee a small amount of very dark pee.   You have a fever.   You pass out (faint).   You have belly (abdominal) pain that gets worse or stays in one spot (localizes).   You have a rash, stiff neck, or bad headache.   You get easily annoyed, sleepy, or are hard to wake up.   You feel weak, dizzy, or very thirsty.  MAKE SURE YOU:    Understand these instructions.   Will watch your condition.   Will get help right away if you are not doing well or get worse.  Document Released: 10/04/2009 Document Revised: 03/01/2012 Document Reviewed: 07/28/2011  [employer] Patient Information 2014 [employer]

## 2014-06-02 LAB — IGG, IGA, IGM
IGG (IMMUNOGLOBIN G), SERUM: 769 mg/dL (ref 650–1600)
IgM, Serum: <5 mg/dL — ABNORMAL LOW (ref 41–251)

## 2014-06-02 LAB — TACROLIMUS LEVEL: Tacrolimus Lvl: 13.9 ng/mL (ref 5.0–20.0)

## 2014-06-07 ENCOUNTER — Ambulatory Visit (HOSPITAL_BASED_OUTPATIENT_CLINIC_OR_DEPARTMENT_OTHER): Payer: BC Managed Care – PPO | Admitting: Oncology

## 2014-06-07 ENCOUNTER — Other Ambulatory Visit (HOSPITAL_BASED_OUTPATIENT_CLINIC_OR_DEPARTMENT_OTHER): Payer: BC Managed Care – PPO

## 2014-06-07 VITALS — BP 148/85 | HR 69 | Temp 98.4°F | Resp 20 | Ht 66.0 in | Wt 215.2 lb

## 2014-06-07 DIAGNOSIS — C8589 Other specified types of non-Hodgkin lymphoma, extranodal and solid organ sites: Secondary | ICD-10-CM

## 2014-06-07 DIAGNOSIS — E139 Other specified diabetes mellitus without complications: Secondary | ICD-10-CM

## 2014-06-07 DIAGNOSIS — M549 Dorsalgia, unspecified: Principal | ICD-10-CM

## 2014-06-07 DIAGNOSIS — I10 Essential (primary) hypertension: Secondary | ICD-10-CM

## 2014-06-07 DIAGNOSIS — Z9489 Other transplanted organ and tissue status: Secondary | ICD-10-CM

## 2014-06-07 DIAGNOSIS — D899 Disorder involving the immune mechanism, unspecified: Secondary | ICD-10-CM

## 2014-06-07 DIAGNOSIS — C9110 Chronic lymphocytic leukemia of B-cell type not having achieved remission: Secondary | ICD-10-CM

## 2014-06-07 DIAGNOSIS — D849 Immunodeficiency, unspecified: Secondary | ICD-10-CM

## 2014-06-07 DIAGNOSIS — T380X5A Adverse effect of glucocorticoids and synthetic analogues, initial encounter: Secondary | ICD-10-CM

## 2014-06-07 DIAGNOSIS — G8929 Other chronic pain: Secondary | ICD-10-CM

## 2014-06-07 DIAGNOSIS — T8609 Other complications of bone marrow transplant: Secondary | ICD-10-CM

## 2014-06-07 DIAGNOSIS — E099 Drug or chemical induced diabetes mellitus without complications: Secondary | ICD-10-CM

## 2014-06-07 DIAGNOSIS — E860 Dehydration: Secondary | ICD-10-CM

## 2014-06-07 DIAGNOSIS — D89811 Chronic graft-versus-host disease: Secondary | ICD-10-CM

## 2014-06-07 DIAGNOSIS — M810 Age-related osteoporosis without current pathological fracture: Secondary | ICD-10-CM

## 2014-06-07 LAB — CBC WITH DIFFERENTIAL/PLATELET
BASO%: 0.2 % (ref 0.0–2.0)
Basophils Absolute: 0 10*3/uL (ref 0.0–0.1)
EOS%: 0.3 % (ref 0.0–7.0)
Eosinophils Absolute: 0 10*3/uL (ref 0.0–0.5)
HEMATOCRIT: 38.5 % (ref 38.4–49.9)
HEMOGLOBIN: 12.9 g/dL — AB (ref 13.0–17.1)
LYMPH%: 34.2 % (ref 14.0–49.0)
MCH: 30.5 pg (ref 27.2–33.4)
MCHC: 33.5 g/dL (ref 32.0–36.0)
MCV: 91 fL (ref 79.3–98.0)
MONO#: 0.3 10*3/uL (ref 0.1–0.9)
MONO%: 5.4 % (ref 0.0–14.0)
NEUT#: 3.8 10*3/uL (ref 1.5–6.5)
NEUT%: 59.9 % (ref 39.0–75.0)
PLATELETS: 140 10*3/uL (ref 140–400)
RBC: 4.23 10*6/uL (ref 4.20–5.82)
RDW: 15.1 % — ABNORMAL HIGH (ref 11.0–14.6)
WBC: 6.3 10*3/uL (ref 4.0–10.3)
lymph#: 2.2 10*3/uL (ref 0.9–3.3)
nRBC: 0 % (ref 0–0)

## 2014-06-07 LAB — MAGNESIUM (CC13): Magnesium: 1.7 mg/dl (ref 1.5–2.5)

## 2014-06-07 LAB — COMPREHENSIVE METABOLIC PANEL (CC13)
ALK PHOS: 74 U/L (ref 40–150)
ALT: 50 U/L (ref 0–55)
AST: 34 U/L (ref 5–34)
Albumin: 3.7 g/dL (ref 3.5–5.0)
Anion Gap: 12 mEq/L — ABNORMAL HIGH (ref 3–11)
BUN: 46 mg/dL — AB (ref 7.0–26.0)
CALCIUM: 9.4 mg/dL (ref 8.4–10.4)
CO2: 17 mEq/L — ABNORMAL LOW (ref 22–29)
Chloride: 110 mEq/L — ABNORMAL HIGH (ref 98–109)
Creatinine: 1.5 mg/dL — ABNORMAL HIGH (ref 0.7–1.3)
Glucose: 221 mg/dl — ABNORMAL HIGH (ref 70–140)
POTASSIUM: 5.4 meq/L — AB (ref 3.5–5.1)
Sodium: 139 mEq/L (ref 136–145)
Total Bilirubin: 0.29 mg/dL (ref 0.20–1.20)
Total Protein: 6.4 g/dL (ref 6.4–8.3)

## 2014-06-07 MED ORDER — INSULIN PEN NEEDLE 31G X 5 MM MISC: Status: DC

## 2014-06-07 MED ORDER — ONETOUCH DELICA LANCETS 33G MISC: Status: DC

## 2014-06-07 MED ORDER — GLUCOSE BLOOD VI STRP: ORAL_STRIP | Status: DC

## 2014-06-07 MED ORDER — INSULIN ASPART PROT & ASPART (70-30 MIX) 100 UNIT/ML PEN: 10.0000 [IU] | PEN_INJECTOR | Freq: Every day | SUBCUTANEOUS | Status: DC

## 2014-06-07 MED ORDER — AMOXICILLIN-POT CLAVULANATE 875-125 MG PO TABS: 1.0000 | ORAL_TABLET | Freq: Four times a day (QID) | ORAL | Status: DC | PRN

## 2014-06-07 MED ORDER — CHOLESTYRAMINE 4 G PO PACK: 4.0000 g | PACK | Freq: Every day | ORAL | Status: DC

## 2014-06-07 NOTE — Progress Notes (Signed)
ID: Timothy [name]   DOB: [birth year]  MR#: [id number]  LXB#:[id number]  CBU:LAGT, Timothy Saxon, MD SU: OTHER MD: Timothy [name]; Timothy Shin, MD; Timothy [name], Timothy Hatcher,MD; Timothy Rosenthal, MD; [facility] in [place], [place]:  Timothy Duval, RN [phone])  CHIEF COMPLAINT:  CLL, status post allogeneic stem cell transplant, GVHD TREATMENT: Immunosuppression  HISTORY OF CLL: From the original intake note:  We have very complete records from Timothy. Racheal Patches in [place], and in summary:  The patient was initially diagnosed in August 2000, with a Timothy [name] cell count of 23,600, but normal hemoglobin and platelets, and no significant symptomatology. Over the next several years his Timothy [name] cell count drifted up, and he eventually developed some symptoms of night sweats in particular, leading to treatment with fludarabine, Cytoxan and rituxan for five cycles given between December 2006 and May 2007.  We have CT scans from June 2006, November 2006 and April 2007, and comparing the November 2006 and April 2007 scans, there was near complete response. He had subsequent therapy in [place] as detailed below, but with decreased response, leading to allogeneic stem-cell transplant at the [facility] 02/24/2012.  Subsequent history is as detailed below.  INTERVAL HISTORY: Timothy [name] returns today accompanied by his wife Timothy [name] for followup of his chronic lymphoid leukemia status post allogenic transplant. About 2 weeks ago we tried to decrease his tacrolimus further and he went to 1 mg twice a day. Within 2 days he was having diarrhea and developed a rash. He went back to 1.5 twice daily and again within 2 days the diarrhea pretty much resolved and the rash started getting significantly better. About 5 days ago he tried again to go to 1 point 5 in the morning and 1.0 in the evening but immediately started having loose bowel movements and he is now back on 1.5 mg  twice a day. His other medications are as before.  REVIEW OF SYSTEMS: Timothy [name] tells me he has had no mouth sores, fever, and his rash is "better". He does have very atrophic skin in bruises very easily. He sleeps poorly because of the occasional nighttime bowel movements, but at least "I know when it's coming". He has had no bowel movements today so far and the one hand last night was formed. He tends to get days and nights mixed up sometimess and then is hard to get them back and rhythm. His diabetes is well-controlled. He needs to have his teeth taken care of. His energy is better than a month ago. This is the end of summer school and he is planning a trip to [place] to visit family. A detailed review of systems today was otherwise stable   PAST MEDICAL HISTORY: Past Medical History  Diagnosis Date  . 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 Diverticular disease   . Hyperlipidemia   . Obesity   . Hypertension   . Hiatal hernia   . CMV (cytomegalovirus) antibody positive     pre-transplant, with seroconversion x2 pst-transplant  . Right bundle branch block     pre-transplant  . CKD (chronic kidney disease) stage 2, GFR 60-89 ml/min   . Pancytopenia   . Steroid-induced diabetes   . Atrial fibrillation     post-transplant  . Myopathy   . 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 Leukemia, chronic lymphoid   . 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  Diarrhea in adult patient 12/05/2012    Due to active GVHD  . CLL (chronic lymphocytic leukemia) 12/05/2012    Dx 07/1999; started Rx 12/06  AlloBMT 3/13  . Rash of face 12/05/2012    Due to GVHD  . Hypomagnesemia 01/26/2013  . Left hip pain 12/01/2013    PAST SURGICAL HISTORY: Past Surgical History  Procedure Laterality Date  . Tonsillectomy and adenoidectomy    . Bone marrow transplant    . Flexible sigmoidoscopy  11/17/2012     Procedure: FLEXIBLE SIGMOIDOSCOPY;  Surgeon: Timothy Columbia, MD;  Location: [facility] ENDOSCOPY;  Service: Endoscopy;  Laterality: N/A;  Timothy [name] states will be admitted to rooom 1339 11/16/12  . Esophagogastroduodenoscopy  11/17/2012    Procedure: ESOPHAGOGASTRODUODENOSCOPY (EGD);  Surgeon: Timothy Columbia, MD;  Location: [facility];  Service: Endoscopy;  Laterality: N/A;    FAMILY HISTORY Family History  Problem Relation Age of Onset  . Cancer Father   The patient's father died from complications of chronic lymphocytic leukemia at the age of 42.  It had been diagnosed seven years before when he was 15.  The patient is enrolled in a familial chronic lymphocytic leukemia study out of the [employer].  The patient's mother is 26, alive, unfortunately suffering with dementia, and he has a brother, 69, who is otherwise in fair health.   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  His wife of >40 years, Timothy [name], is a homemaker.  Their daughter, Timothy [name], lives in [place].  She also is a Agricultural engineer.  The patient has an 60 year old grandson and an 35-year-old granddaughter, and that is really the main reason he moved to this area.  He is a Tourist information centre manager.     ADVANCED DIRECTIVES: In place  HEALTH MAINTENANCE: (Updated 04/13/2014) History  Substance Use Topics  . Smoking status: Never Smoker   . Smokeless tobacco: Never Used  . Alcohol Use: No     Colonoscopy: Nov 2013, Timothy. Watt [name]  PSA: Not on file  Bone density:  Feb 2014;  Patient also has known insufficiency and pathologic fractures in addition to his long-standing history of steroid use.  Lipid panel: Jan 2015, elevated    Allergies  Allergen Reactions  . Benadryl [Diphenhydramine Hcl]     "Restless leg syndrome"     Current Outpatient Prescriptions  Medication Sig Dispense Refill  . acyclovir (ZOVIRAX) 400 MG tablet Take 2 tablets (800 mg total) by mouth 2 (two) times daily.  120 tablet  3  . budesonide (ENTOCORT EC) 3 MG 24 hr capsule Take 1 capsule (3 mg total) by mouth 3 (three) times daily.  90 capsule  6  . cholecalciferol (VITAMIN D) 1000 UNITS tablet Take 1,000 Units by mouth every evening.       . cholestyramine (QUESTRAN) 4 G packet Take 1 packet (4 g total) by mouth daily.  30 each  12  . diltiazem (CARDIZEM CD) 240 MG 24 hr capsule       . diltiazem (CARDIZEM CD) 240 MG 24 hr capsule TAKE 1 CAPSULE BY MOUTH DAILY  30 capsule  3  . diltiazem (DILACOR XR) 240 MG 24 hr capsule Take 1 capsule (240 mg total) by mouth every evening.  30 capsule  5  . fluconazole (DIFLUCAN) 100 MG tablet Take  1 tablet (100 mg total) by mouth daily.  30 tablet  6  . furosemide (LASIX) 20 MG tablet Take 1 tablet (20 mg total) by mouth daily.  30 tablet  5  . glucose blood (ONE TOUCH ULTRA TEST) test strip Test before meals and at bedtime.  300 each  0  . HYDROcodone-acetaminophen (NORCO) 10-325 MG per tablet Take 1-2 tablets by mouth every 6 (six) hours as needed for moderate pain.  120 tablet  0  . Insulin Aspart Prot & Aspart (70-30) 100 UNIT/ML SUPN Inject 10 Units into the skin daily with breakfast.       . Insulin Pen Needle (B-D UF III MINI PEN NEEDLES) 31G X 5 MM MISC Use twice daily with insulin as directed  100 each  5  . labetalol (NORMODYNE) 200 MG tablet Take 400 mg by mouth 2 (two) times daily.       . Lidocaine-Hydrocortisone Ace 3-0.5 % KIT Apply 1 application topically as needed (for pain).  1 each  3  . lidocaine-prilocaine (EMLA) cream Apply 1 application topically as needed. 1-2 hrs before each port access  30 g  6  . lisinopril (PRINIVIL,ZESTRIL) 10 MG tablet Take 10 mg by mouth every evening.       . loratadine (CLARITIN) 10 MG tablet Take 10 mg by mouth daily.      . Multiple Vitamin  (MULTIVITAMIN WITH MINERALS) TABS tablet Take 1 tablet by mouth daily.      Marland Kitchen omeprazole (PRILOSEC) 20 MG capsule Take 1 capsule (20 mg total) by mouth daily.  30 capsule  5  . ONETOUCH DELICA LANCETS 73U MISC Test before meals and at bedtime.  300 each  0  . predniSONE (DELTASONE) 5 MG tablet Take 0.5-1 tablets (2.5-5 mg total) by mouth 2 (two) times daily. Takes 71m in the morning and 2.582min the evening.  45 tablet  2  . sertraline (ZOLOFT) 50 MG tablet ALTERNATE TAKING 1 TABLET DAILY AND 2 TABLETS DAILY  90 tablet  5  . sulfamethoxazole-trimethoprim (BACTRIM DS) 800-160 MG per tablet Take 1 tablet by mouth daily.  30 tablet  5  . tacrolimus (PROGRAF) 0.5 MG capsule TAKE 2 capsules in the morning and 2 in the evening      . VOLTAREN 1 % GEL Apply 2 g topically 2 (two) times daily. Applied to back      . [DISCONTINUED] insulin aspart (NOVOLOG FLEXPEN) 100 UNIT/ML SOPN FlexPen 18units sq qam, 9units sq qpm, or as directed  15 mL  1   No current facility-administered medications for this visit.   Facility-Administered Medications Ordered in Other Visits  Medication Dose Route Frequency Provider Last Rate Last Dose  . 0.9 %  sodium chloride infusion   Intravenous Continuous Amy G Berry, PA-C 500 mL/hr at 03/12/13 0900    . sodium chloride 0.9 % injection 10 mL  10 mL Intravenous PRN GuChauncey CruelMD   10 mL at 08/11/12 1606    OBJECTIVE: Middle-aged Timothy [name] man in no acute distress   Filed Vitals:   06/07/14 1630  BP: 148/85  Pulse: 69  Temp: 98.4 F (36.9 C)  Resp: 20  Body mass index is 34.75 kg/(m^2).  ECOG: 1 Filed Weights   06/07/14 1630  Weight: 215 lb 3.2 oz (97.614 kg)   Sclerae unicteric, pupils equal and reactive; face slightly plethoric Oropharynx clear and moist-- no lesions noted No cervical or supraclavicular adenopathy Lungs no rales or rhonchi Heart regular rate  and rhythm Abd soft, obese, nontender, positive bowel sounds MSK no focal spinal tenderness, no upper  extremity lymphedema; he is not yet able to stand up from a chair without pushing off Neuro: nonfocal, well oriented, positive affect Skin: He has a maculopapular confluent rash in the upper anterior right chest measuring approximately 14 x 14 cm. It is not itchy and does not blanch. Otherwise he has multiple ecchymoses, not obviously different from prior.  LABS:  CBC    Component Value Date/Time   WBC 6.3 06/07/2014 1618   WBC 7.1 04/06/2014 0949   RBC 4.23 06/07/2014 1618   RBC 4.06* 04/06/2014 0949   RBC 3.82* 03/16/2013 1400   HGB 12.9* 06/07/2014 1618   HGB 12.2* 04/06/2014 0949   HCT 38.5 06/07/2014 1618   HCT 38.5* 04/06/2014 0949   PLT 140 06/07/2014 1618   PLT 144* 04/06/2014 0949   MCV 91.0 06/07/2014 1618   MCV 94.8 04/06/2014 0949   MCH 30.5 06/07/2014 1618   MCH 30.0 04/06/2014 0949   MCHC 33.5 06/07/2014 1618   MCHC 31.7 04/06/2014 0949   RDW 15.1* 06/07/2014 1618   RDW 15.7* 04/06/2014 0949   LYMPHSABS 2.2 06/07/2014 1618   LYMPHSABS 1.4 03/18/2013 0615   MONOABS 0.3 06/07/2014 1618   MONOABS 0.3 03/18/2013 0615   EOSABS 0.0 06/07/2014 1618   EOSABS 0.0 03/18/2013 0615   BASOSABS 0.0 06/07/2014 1618   BASOSABS 0.0 03/18/2013 0615        Chemistry      Component Value Date/Time   NA 138 06/01/2014 1306   NA 131* 06/14/2013 0800   K 4.8 06/01/2014 1306   K 4.7 06/14/2013 0800   CL 101 06/15/2013 1034   CL 97 06/14/2013 0800   CO2 21* 06/01/2014 1306   CO2 24 06/14/2013 0800   BUN 43.4* 06/01/2014 1306   BUN 38* 06/14/2013 0800   CREATININE 1.2 06/01/2014 1306   CREATININE 0.97 06/14/2013 0800      Component Value Date/Time   CALCIUM 8.7 06/01/2014 1306   CALCIUM 9.1 06/14/2013 0800   ALKPHOS 73 06/01/2014 1306   ALKPHOS 75 04/18/2013 1218   AST 33 06/01/2014 1306   AST 24 04/18/2013 1218   ALT 56* 06/01/2014 1306   ALT 33 04/18/2013 1218   BILITOT 0.33 06/01/2014 1306   BILITOT 0.2* 04/18/2013 1218     Results for NAHSIR, VENEZIA (MRN [id number]) as of 06/07/2014 16:37  Ref. Range  03/23/2014 12:22 04/13/2014 14:50 05/04/2014 14:03 05/25/2014 11:17 06/01/2014 13:07  IgG (Immunoglobin G), Serum Latest Range: [phone] mg/dL [phone] (L) 355 (L) [phone]    STUDIES: No results found.  ASSESSMENT: 68 y.o. [place] man with a history of well-differentiated lymphocytic lymphoma/ chronic lymphoid leukemia initially diagnosed in 2000, not requiring intervention until 2006; with multiple chromosomal abnormalities.  His treatment history is as follows:  (1) fludarabine/cyclophosphamide/rituximab x5 completed May 2007.   (2) rituximab for 8 doses October 2010, with partial response   (3) Leustatin and ofatumumab weekly x8 July to September 2011 followed by maintenance ofatumumab  every 2 months, with initial response but rising counts September 2012   (4) status-post unrelated donor stem-cell transplant 02/24/2012 at the [facility]  (a) conditioning regimen consisted of fludarabine + TBI at 200 cGy, followed by rituximab x27;  (b) CMV reactivation x3 (patient CMV positive, donor negative), s/p ganciclovir treatment; 3d reactivation August 2013, s/p gancyclovir, with negative PCR mid-September 2013; last gancyclovir dose 10/06/2012 (c) Chronic GVHD: involving gut and  skin, treated with steroids, tacrolimus and MMF.  MMF was eventually d/c'd and tacrolimus currently at a dose of 1.30m BID (d) atrial fibrillation: resolved on brief amiodarone regimen (e) steroid-induced myopathy: improving  (f) hypomagnesemia: improved after d/c gancyclovir, needs continuing support (g) hypogammaglobulinemia: requiring IVIG most recently 03/15/2014. (h) history of elevated triglycerides (606 on 07/14/2012)  (i) adrenal insufficiency: on prednisone and budesonide (j) pancytopenia,resolved (k) brief episode of neutropenia ([id number]) February 2015, accompanied by diarrhea; resolved   (5) restaging studies February-March 2015 including CT scans, flow cytometry, and bone marrow biopsy, showed no evidence of  residual chronic lymphoid leukemia.  (6) recurrent GVHD (skin rash, mouth changes, severe diarrhea and gastric/duodenal/colonic biopsies 11/17/2012 c/w GVHD grade 2) : now grade 1 to inactive  (7)  malnutrition -- on VITAL supplement in addition to regular diet; on Marinol for anorexia  (8) testosterone deficiency--on patch   (9) deconditioning: Especially quad weakness  (10) mild dehydration: encouraged increased po fluids; receives IVF support w magnesium weekly  (11) severe steroid-induced osteoporosis with compression fractures: received pamidronate 12/18/2012. Status post kyphoplasty at L3-4 in June 2014. Also with evidence of rib fractures and insufficiency fractures bilaterally of the sacral  alae, noted by CT in March 2015. --   Denosumab started 12/08/2013, given as prolia Q6 months which is what has been approved by his insurance, most recent dose 03/30/2014  (12) chronic back pain and hip pain controlled with OxyContin and hydrocodone/APAP.  (12) nausea: well controlled on current meds  (13)  Positive c.diff, 03/08/2013, on Flagyl 500 mg TID x 20 days, then on oral vanco with Questran, showing improvement; positive when repeated April 2014; Negative x 3   Results for MDONLEY, HARLAND(MRN [id number] as of 02/02/2014 14:19  Ref. Range 03/08/2013 15:26 03/29/2013 13:45 12/08/2013 11:52 01/18/2014 14:08 01/27/2014 13:31  C difficile by pcr Latest Range: Negative  Positive (AA) Positive (AA) Negative Negative Negative   (14) persistently increased BUN  (15)  Hypertension, on labetalol, cardizem, lisinopril, and furosemide; managed by Timothy. SBrigitte Pulse (16) steroid induced hyperglycemia, on sterlix and 70/30 insulin, followed by Timothy. ELoanne Drillingand Timothy. SBrigitte Pulse (17) hypogammaglobulinemia-- requiring intermittent supplementation  (18) squamous cell CA in situ removed from left parietal scalp October 2014, second lesion to be removed may 2015   PLAN: TOttois recovering from our attempt at further  dropping his tacrolimus does. I think it would be best for him to stay on 1.5 mg twice daily until his rash is completely resolved. He skin is see me again in a month and at that time we will again try to go to 1 point 5 in the morning and 1.0 in the evening.  Otherwise we are continuing to do the weekly fluids with magnesium, and the probably every 6 months, with the next dose due in October. He needs to get his teeth taken care of and I have written him for amoxicillin 500 mg to take 4 times a day on the day of the dental cleaning. We will continue to check his IgG on a monthly level and replace when it drops below 500. An of course we will continue to monitor him for diarrhea, rash, or mouth sores which might indicate worsening of his graft-versus-host disease.  Today I refilled his Questran and his diabetes paraphernalia and medications in preparation for his CWisconsintrip.  TRayenhas a good understanding of the overall plan. He agrees with it. Heknows the goal of treatment in her case is control.  He will call with any problems that may develop before his next visit here.    Chauncey Cruel, MD 06/07/2014

## 2014-06-08 ENCOUNTER — Other Ambulatory Visit: Payer: BC Managed Care – PPO

## 2014-06-08 ENCOUNTER — Telehealth: Payer: Self-pay | Admitting: *Deleted

## 2014-06-08 ENCOUNTER — Telehealth: Payer: Self-pay | Admitting: Oncology

## 2014-06-08 ENCOUNTER — Ambulatory Visit (HOSPITAL_BASED_OUTPATIENT_CLINIC_OR_DEPARTMENT_OTHER): Payer: BC Managed Care – PPO

## 2014-06-08 MED ORDER — SODIUM CHLORIDE 0.9 % IV SOLN
Freq: Once | INTRAVENOUS | Status: AC
  Administered 2014-06-08: 14:00:00 via INTRAVENOUS

## 2014-06-08 MED ORDER — SODIUM CHLORIDE 0.9 % IV SOLN
Freq: Once | INTRAVENOUS | Status: AC
  Administered 2014-06-08: 14:00:00 via INTRAVENOUS
  Filled 2014-06-08: qty 500

## 2014-06-08 MED ORDER — HEPARIN SOD (PORK) LOCK FLUSH 100 UNIT/ML IV SOLN
500.0000 [IU] | Freq: Once | INTRAVENOUS | Status: AC
  Administered 2014-06-08: 500 [IU] via INTRAVENOUS
  Filled 2014-06-08: qty 5

## 2014-06-08 MED ORDER — SODIUM CHLORIDE 0.9 % IJ SOLN
10.0000 mL | INTRAMUSCULAR | Status: DC | PRN
  Administered 2014-06-08: 10 mL via INTRAVENOUS
  Filled 2014-06-08: qty 10

## 2014-06-08 NOTE — Telephone Encounter (Signed)
cld & spoke with pt in re to sch-pt stated came today and got updated copy of sch-adv i woulsd not the acct

## 2014-06-08 NOTE — Telephone Encounter (Signed)
Per staff message and POF I have scheduled appts. Advised scheduler of appts. Advised scheduler to move labs JMW  

## 2014-06-08 NOTE — Patient Instructions (Signed)
Hypomagnesemia Magnesium is a common ion (mineral) in the body which is needed for metabolism. It is about how the body handles food and other chemical reactions necessary for life. Only about 2% of the magnesium in our body is found in the blood. When this is low, it is called hypomagnesemia. The blood will measure only a tiny amount of the magnesium in our body. When it is low in our blood, it does not mean that the whole body supply is low. The normal serum concentration ranges from 1.8-2.5 mEq/L. When the level gets to be less than 1.0 mEq/L, a number of problems begin to happen.  CAUSES   Receiving intravenous fluids without magnesium replacement.  Loss of magnesium from the bowel by naso-gastric suction.  Loss of magnesium from nausea and vomiting or severe diarrhea. Any of the inflammatory bowel conditions can cause this.  Abuse of alcohol often leads to low serum magnesium.  An inherited form of magnesium loss happens when the kidneys lose magnesium. This is called familial or primary hypomagnesemia.  Some medications such as diuretics also cause the loss of magnesium. SYMPTOMS  These following problems are worse if the changes in magnesium levels come on suddenly.  Tremor.  Confusion.  Muscle weakness.  Over-sensitive to sights and sounds.  Sensitive reflexes.  Depression.  Muscular fibrillations.  Over-reactivity of the nerves.  Irritability.  Psychosis.  Spasms of the hand muscles.  Tetany (where the muscles go into uncontrollable spasms). DIAGNOSIS  This condition can be diagnosed by blood tests. TREATMENT   In emergency, magnesium can be given intravenously (by vein).  If the condition is less worrisome, it can be corrected by diet. High levels of magnesium are found in green leafy vegetables, peas, beans and nuts among other things. It can also be given through medications by mouth.  If it is being caused by medications, changes can be made.  If  alcohol is a problem, help is available if there are difficulties giving it up. Document Released: 09/03/2005 Document Revised: 03/01/2012 Document Reviewed: 07/28/2008 [facility] Patient Information 2015 [place], [place]. This information is not intended to replace advice given to you by your health care provider. Make sure you discuss any questions you have with your health care provider.

## 2014-06-08 NOTE — Telephone Encounter (Signed)
per pof to sch appt/sent email to MW to sch trmts-will call pt after reply

## 2014-06-09 MED ORDER — ACETAMINOPHEN 325 MG PO TABS
ORAL_TABLET | ORAL | Status: AC
  Filled 2014-06-09: qty 2

## 2014-06-09 MED ORDER — DIPHENHYDRAMINE HCL 25 MG PO CAPS
ORAL_CAPSULE | ORAL | Status: AC
  Filled 2014-06-09: qty 2

## 2014-06-13 ENCOUNTER — Other Ambulatory Visit: Payer: Self-pay | Admitting: Physician Assistant

## 2014-06-15 ENCOUNTER — Other Ambulatory Visit: Payer: Self-pay | Admitting: *Deleted

## 2014-06-15 ENCOUNTER — Ambulatory Visit (HOSPITAL_BASED_OUTPATIENT_CLINIC_OR_DEPARTMENT_OTHER): Payer: BC Managed Care – PPO

## 2014-06-15 ENCOUNTER — Other Ambulatory Visit: Payer: Self-pay

## 2014-06-15 ENCOUNTER — Other Ambulatory Visit (HOSPITAL_BASED_OUTPATIENT_CLINIC_OR_DEPARTMENT_OTHER): Payer: BC Managed Care – PPO

## 2014-06-15 VITALS — BP 131/76 | HR 72 | Resp 18

## 2014-06-15 DIAGNOSIS — C9110 Chronic lymphocytic leukemia of B-cell type not having achieved remission: Secondary | ICD-10-CM

## 2014-06-15 DIAGNOSIS — I10 Essential (primary) hypertension: Secondary | ICD-10-CM

## 2014-06-15 DIAGNOSIS — D89811 Chronic graft-versus-host disease: Secondary | ICD-10-CM

## 2014-06-15 DIAGNOSIS — T8609 Other complications of bone marrow transplant: Principal | ICD-10-CM

## 2014-06-15 LAB — CBC WITH DIFFERENTIAL/PLATELET
BASO%: 0 % (ref 0.0–2.0)
Basophils Absolute: 0 10*3/uL (ref 0.0–0.1)
EOS%: 0.6 % (ref 0.0–7.0)
Eosinophils Absolute: 0 10*3/uL (ref 0.0–0.5)
HEMATOCRIT: 38.5 % (ref 38.4–49.9)
HGB: 12.7 g/dL — ABNORMAL LOW (ref 13.0–17.1)
LYMPH%: 33.4 % (ref 14.0–49.0)
MCH: 30.3 pg (ref 27.2–33.4)
MCHC: 33 g/dL (ref 32.0–36.0)
MCV: 91.9 fL (ref 79.3–98.0)
MONO#: 0.6 10*3/uL (ref 0.1–0.9)
MONO%: 8.7 % (ref 0.0–14.0)
NEUT%: 57.3 % (ref 39.0–75.0)
NEUTROS ABS: 3.6 10*3/uL (ref 1.5–6.5)
PLATELETS: 129 10*3/uL — AB (ref 140–400)
RBC: 4.19 10*6/uL — ABNORMAL LOW (ref 4.20–5.82)
RDW: 15 % — ABNORMAL HIGH (ref 11.0–14.6)
WBC: 6.3 10*3/uL (ref 4.0–10.3)
lymph#: 2.1 10*3/uL (ref 0.9–3.3)

## 2014-06-15 LAB — COMPREHENSIVE METABOLIC PANEL (CC13)
ALT: 37 U/L (ref 0–55)
ANION GAP: 9 meq/L (ref 3–11)
AST: 27 U/L (ref 5–34)
Albumin: 3.8 g/dL (ref 3.5–5.0)
Alkaline Phosphatase: 67 U/L (ref 40–150)
BILIRUBIN TOTAL: 0.33 mg/dL (ref 0.20–1.20)
BUN: 49.9 mg/dL — ABNORMAL HIGH (ref 7.0–26.0)
CO2: 18 mEq/L — ABNORMAL LOW (ref 22–29)
Calcium: 8.9 mg/dL (ref 8.4–10.4)
Chloride: 110 mEq/L — ABNORMAL HIGH (ref 98–109)
Creatinine: 1.4 mg/dL — ABNORMAL HIGH (ref 0.7–1.3)
GLUCOSE: 161 mg/dL — AB (ref 70–140)
Potassium: 5.1 mEq/L (ref 3.5–5.1)
Sodium: 137 mEq/L (ref 136–145)
Total Protein: 6.4 g/dL (ref 6.4–8.3)

## 2014-06-15 LAB — MAGNESIUM (CC13): MAGNESIUM: 1.8 mg/dL (ref 1.5–2.5)

## 2014-06-15 MED ORDER — SODIUM CHLORIDE 0.9 % IJ SOLN
10.0000 mL | INTRAMUSCULAR | Status: DC | PRN
  Administered 2014-06-15: 10 mL via INTRAVENOUS
  Filled 2014-06-15: qty 10

## 2014-06-15 MED ORDER — SODIUM CHLORIDE 0.9 % IV SOLN
INTRAVENOUS | Status: DC
  Administered 2014-06-15: 12:00:00 via INTRAVENOUS
  Filled 2014-06-15: qty 500

## 2014-06-15 MED ORDER — HEPARIN SOD (PORK) LOCK FLUSH 100 UNIT/ML IV SOLN
500.0000 [IU] | Freq: Once | INTRAVENOUS | Status: AC
  Administered 2014-06-15: 500 [IU] via INTRAVENOUS
  Filled 2014-06-15: qty 5

## 2014-06-15 NOTE — Patient Instructions (Signed)
Magnesium This is a blood test which measures the amount of magnesium in your blood. Most of the magnesium in your body exists in your cells. However, the magnesium in your blood is important for many processes. It is important for your nerves to be able to conduct electrical energy. This is important in heart patients with higher heartbeats. When your heart beats and then gets ready to beat again, it repolarizes. If the magnesium levels are low or high, it can affect the accuracy of the repolarization process. The magnesium levels are also monitored during pregnancy. This is done to determine if the expectant mother may have preeclampsia or toxemia. Magnesium is often used for treatment of these problems. PREPARATION FOR TEST No preparation or fasting is needed. A blood sample may be taken by inserting a needle into a vein in the arm.  NORMAL FINDINGS  Adult: 1.3 to 2.1 mEq/L or 0.65 to 1.05 mmol/L (SI units)  Child: 1.4 to 1.7 mEq/L  Newborn: 1.4 to 2 mEq/L Possible critical values: less than 0.5 mEq/L or greater than 3 mEq/L Ranges for normal findings may vary among different laboratories and hospitals. You should always check with your caregiver after having lab work or other tests done to discuss the meaning of your test results and whether your values are considered within normal limits. MEANING OF TEST  Your caregiver will go over the test results with you. Your caregiver will discuss the importance and meaning of your results. He or she will also discuss treatment options and additional tests, if needed. OBTAINING THE TEST RESULTS It is your responsibility to obtain your test results. Ask the lab or department performing the test when and how you will get your results. Document Released: 01/10/2005 Document Revised: 03/01/2012 Document Reviewed: 11/18/2008 [employer] Patient Information 2015 [employer]. This information is not intended to replace advice given to you by your health care  provider. Make sure you discuss any questions you have with your health care provider.  

## 2014-06-20 ENCOUNTER — Other Ambulatory Visit: Payer: Self-pay | Admitting: *Deleted

## 2014-06-20 ENCOUNTER — Telehealth: Payer: Self-pay | Admitting: *Deleted

## 2014-06-20 NOTE — Telephone Encounter (Signed)
This RN spoke with pt per his call stating onset of diarrhea yesterday and ongoing until early am this morning.  Per discussion Timothy [name] took a vancomycin tablet at 130am with no further diarrhea. Noted vancomycin was discontinued approximately 1 month ago.  Timothy [name] also states several episodes of vomiting from aapm to 2am - now with no further episodes.  With further discussion Timothy [name] states no change in the tacrolimus dose with current dose of 1mg  bid. He has missed several questran doses.  This RN discussed with Timothy [name] better to resume the questran vs vancomycin for diarrhea at this time.  Plan per call is Timothy [name] will resume questran and monitor for recurrence of diarrhea. If needed vancomycin may need to be resumed but not until he is more compliant with questran.  Timothy [name] verbalized understanding.  He is scheduled for his weekly IV magnesium on Thursday and will ask for this RN to give general update.  If diarrhea recurs post resuming [name] will call the office.

## 2014-06-22 ENCOUNTER — Other Ambulatory Visit (HOSPITAL_BASED_OUTPATIENT_CLINIC_OR_DEPARTMENT_OTHER): Payer: BC Managed Care – PPO

## 2014-06-22 ENCOUNTER — Ambulatory Visit (HOSPITAL_BASED_OUTPATIENT_CLINIC_OR_DEPARTMENT_OTHER): Payer: BC Managed Care – PPO

## 2014-06-22 ENCOUNTER — Other Ambulatory Visit: Payer: Self-pay

## 2014-06-22 ENCOUNTER — Other Ambulatory Visit: Payer: Self-pay | Admitting: *Deleted

## 2014-06-22 VITALS — BP 155/89 | HR 69 | Temp 97.7°F | Resp 18

## 2014-06-22 DIAGNOSIS — D809 Immunodeficiency with predominantly antibody defects, unspecified: Secondary | ICD-10-CM

## 2014-06-22 DIAGNOSIS — T8609 Other complications of bone marrow transplant: Secondary | ICD-10-CM

## 2014-06-22 DIAGNOSIS — D89811 Chronic graft-versus-host disease: Secondary | ICD-10-CM

## 2014-06-22 DIAGNOSIS — C9110 Chronic lymphocytic leukemia of B-cell type not having achieved remission: Secondary | ICD-10-CM

## 2014-06-22 LAB — COMPREHENSIVE METABOLIC PANEL (CC13)
ALT: 37 U/L (ref 0–55)
ANION GAP: 10 meq/L (ref 3–11)
AST: 25 U/L (ref 5–34)
Albumin: 3.8 g/dL (ref 3.5–5.0)
Alkaline Phosphatase: 71 U/L (ref 40–150)
BUN: 51.9 mg/dL — ABNORMAL HIGH (ref 7.0–26.0)
CALCIUM: 9.5 mg/dL (ref 8.4–10.4)
CHLORIDE: 113 meq/L — AB (ref 98–109)
CO2: 15 meq/L — AB (ref 22–29)
Creatinine: 1.4 mg/dL — ABNORMAL HIGH (ref 0.7–1.3)
Glucose: 148 mg/dl — ABNORMAL HIGH (ref 70–140)
Potassium: 5.1 mEq/L (ref 3.5–5.1)
SODIUM: 138 meq/L (ref 136–145)
TOTAL PROTEIN: 6.3 g/dL — AB (ref 6.4–8.3)
Total Bilirubin: 0.29 mg/dL (ref 0.20–1.20)

## 2014-06-22 LAB — CBC WITH DIFFERENTIAL/PLATELET
BASO%: 0.5 % (ref 0.0–2.0)
BASOS ABS: 0 10*3/uL (ref 0.0–0.1)
EOS ABS: 0 10*3/uL (ref 0.0–0.5)
EOS%: 0.5 % (ref 0.0–7.0)
HCT: 39.8 % (ref 38.4–49.9)
HEMOGLOBIN: 12.9 g/dL — AB (ref 13.0–17.1)
LYMPH%: 36.6 % (ref 14.0–49.0)
MCH: 30.2 pg (ref 27.2–33.4)
MCHC: 32.4 g/dL (ref 32.0–36.0)
MCV: 93.2 fL (ref 79.3–98.0)
MONO#: 0.4 10*3/uL (ref 0.1–0.9)
MONO%: 6.6 % (ref 0.0–14.0)
NEUT%: 55.8 % (ref 39.0–75.0)
NEUTROS ABS: 3.6 10*3/uL (ref 1.5–6.5)
Platelets: 170 10*3/uL (ref 140–400)
RBC: 4.27 10*6/uL (ref 4.20–5.82)
RDW: 15.3 % — AB (ref 11.0–14.6)
WBC: 6.5 10*3/uL (ref 4.0–10.3)
lymph#: 2.4 10*3/uL (ref 0.9–3.3)

## 2014-06-22 LAB — MAGNESIUM (CC13): Magnesium: 1.9 mg/dl (ref 1.5–2.5)

## 2014-06-22 MED ORDER — SODIUM CHLORIDE 0.9 % IJ SOLN
10.0000 mL | INTRAMUSCULAR | Status: DC | PRN
  Administered 2014-06-22: 10 mL via INTRAVENOUS
  Filled 2014-06-22: qty 10

## 2014-06-22 MED ORDER — SODIUM CHLORIDE 0.9 % IV SOLN
INTRAVENOUS | Status: AC
  Administered 2014-06-22: 12:00:00 via INTRAVENOUS
  Filled 2014-06-22: qty 500

## 2014-06-22 MED ORDER — HEPARIN SOD (PORK) LOCK FLUSH 100 UNIT/ML IV SOLN
500.0000 [IU] | Freq: Once | INTRAVENOUS | Status: AC
  Administered 2014-06-22: 500 [IU] via INTRAVENOUS
  Filled 2014-06-22: qty 5

## 2014-06-22 NOTE — Patient Instructions (Signed)
Magnesium Sulfate injection °What is this medicine? °MAGNESIUM SULFATE (mag NEE zee um SUL fate) is an electrolyte injection commonly used to treat low magnesium levels in your blood and to prevent or control certain seizures. °This medicine may be used for other purposes; ask your health care provider or pharmacist if you have questions. °What should I tell my health care provider before I take this medicine? °They need to know if you have any of these conditions: °-heart disease °-history of irregular heart beat °-kidney disease °-an unusual or allergic reaction to magnesium sulfate, medicines, foods, dyes, or preservatives °-pregnant or trying to get pregnant °-breast-feeding °How should I use this medicine? °This medicine is for infusion into a vein. It is given by a health care professional in a hospital or clinic setting. °Talk to your pediatrician regarding the use of this medicine in children. While this drug may be prescribed for selected conditions, precautions do apply. °Overdosage: If you think you have taken too much of this medicine contact a poison control center or emergency room at once. °NOTE: This medicine is only for you. Do not share this medicine with others. °What if I miss a dose? °This does not apply. °What may interact with this medicine? °This medicine may interact with the following medications: °-certain medicines for anxiety or sleep °-certain medicines for seizures like phenobarbital °-digoxin °-medicines that relax muscles for surgery °-narcotic medicines for pain °This list may not describe all possible interactions. Give your health care provider a list of all the medicines, herbs, non-prescription drugs, or dietary supplements you use. Also tell them if you smoke, drink alcohol, or use illegal drugs. Some items may interact with your medicine. °What should I watch for while using this medicine? °Your condition will be monitored carefully while you are receiving this medicine. You  may need blood work done while you are receiving this medicine. °What side effects may I notice from receiving this medicine? °Side effects that you should report to your doctor or health care professional as soon as possible: °-allergic reactions like skin rash, itching or hives, swelling of the face, lips, or tongue °-facial flushing °-muscle weakness °-signs and symptoms of low blood pressure like dizziness; feeling faint or lightheaded, falls; unusually weak or tired °-signs and symptoms of a dangerous change in heartbeat or heart rhythm like chest pain; dizziness; fast or irregular heartbeat; palpitations; breathing problems °-sweating °This list may not describe all possible side effects. Call your doctor for medical advice about side effects. You may report side effects to FDA at 1-800-FDA-1088. °Where should I keep my medicine? °This drug is given in a hospital or clinic and will not be stored at home. °NOTE: This sheet is a summary. It may not cover all possible information. If you have questions about this medicine, talk to your doctor, pharmacist, or health care provider. °© 2015, Elsevier/Gold Standard. (2013-04-18 10:35:11) ° °

## 2014-06-23 LAB — IGG, IGA, IGM
IgA: <6 mg/dL — ABNORMAL LOW (ref 68–379)
IgG (Immunoglobin G), Serum: 460 mg/dL — ABNORMAL LOW (ref 650–1600)
IgM, Serum: <5 mg/dL — ABNORMAL LOW (ref 41–251)

## 2014-06-29 ENCOUNTER — Other Ambulatory Visit (HOSPITAL_BASED_OUTPATIENT_CLINIC_OR_DEPARTMENT_OTHER): Payer: BC Managed Care – PPO

## 2014-06-29 ENCOUNTER — Other Ambulatory Visit: Payer: Self-pay

## 2014-06-29 ENCOUNTER — Ambulatory Visit (HOSPITAL_BASED_OUTPATIENT_CLINIC_OR_DEPARTMENT_OTHER): Payer: BC Managed Care – PPO

## 2014-06-29 ENCOUNTER — Other Ambulatory Visit: Payer: Self-pay | Admitting: *Deleted

## 2014-06-29 VITALS — BP 154/84 | HR 76 | Temp 98.3°F | Resp 18

## 2014-06-29 DIAGNOSIS — C9110 Chronic lymphocytic leukemia of B-cell type not having achieved remission: Secondary | ICD-10-CM

## 2014-06-29 LAB — CBC WITH DIFFERENTIAL/PLATELET
BASO%: 0.5 % (ref 0.0–2.0)
BASOS ABS: 0 10*3/uL (ref 0.0–0.1)
EOS%: 0.7 % (ref 0.0–7.0)
Eosinophils Absolute: 0 10*3/uL (ref 0.0–0.5)
HCT: 39.3 % (ref 38.4–49.9)
HGB: 12.9 g/dL — ABNORMAL LOW (ref 13.0–17.1)
LYMPH%: 37.8 % (ref 14.0–49.0)
MCH: 30.8 pg (ref 27.2–33.4)
MCHC: 32.9 g/dL (ref 32.0–36.0)
MCV: 93.7 fL (ref 79.3–98.0)
MONO#: 0.5 10*3/uL (ref 0.1–0.9)
MONO%: 8.1 % (ref 0.0–14.0)
NEUT#: 3.4 10*3/uL (ref 1.5–6.5)
NEUT%: 52.9 % (ref 39.0–75.0)
Platelets: 188 10*3/uL (ref 140–400)
RBC: 4.19 10*6/uL — AB (ref 4.20–5.82)
RDW: 15.3 % — AB (ref 11.0–14.6)
WBC: 6.5 10*3/uL (ref 4.0–10.3)
lymph#: 2.5 10*3/uL (ref 0.9–3.3)

## 2014-06-29 LAB — COMPREHENSIVE METABOLIC PANEL (CC13)
ALT: 32 U/L (ref 0–55)
AST: 25 U/L (ref 5–34)
Albumin: 3.8 g/dL (ref 3.5–5.0)
Alkaline Phosphatase: 69 U/L (ref 40–150)
Anion Gap: 7 mEq/L (ref 3–11)
BUN: 46.5 mg/dL — ABNORMAL HIGH (ref 7.0–26.0)
CALCIUM: 9.2 mg/dL (ref 8.4–10.4)
CHLORIDE: 112 meq/L — AB (ref 98–109)
CO2: 20 mEq/L — ABNORMAL LOW (ref 22–29)
CREATININE: 1.3 mg/dL (ref 0.7–1.3)
Glucose: 163 mg/dl — ABNORMAL HIGH (ref 70–140)
Potassium: 5.3 mEq/L — ABNORMAL HIGH (ref 3.5–5.1)
Sodium: 138 mEq/L (ref 136–145)
Total Bilirubin: 0.28 mg/dL (ref 0.20–1.20)
Total Protein: 6.3 g/dL — ABNORMAL LOW (ref 6.4–8.3)

## 2014-06-29 LAB — MAGNESIUM (CC13): Magnesium: 1.8 mg/dl (ref 1.5–2.5)

## 2014-06-29 MED ORDER — SODIUM CHLORIDE 0.9 % IJ SOLN
10.0000 mL | INTRAMUSCULAR | Status: DC | PRN
  Administered 2014-06-29: 10 mL via INTRAVENOUS
  Filled 2014-06-29: qty 10

## 2014-06-29 MED ORDER — SODIUM CHLORIDE 0.9 % IV SOLN
INTRAVENOUS | Status: AC
  Administered 2014-06-29: 12:00:00 via INTRAVENOUS
  Filled 2014-06-29: qty 500

## 2014-06-29 MED ORDER — HEPARIN SOD (PORK) LOCK FLUSH 100 UNIT/ML IV SOLN
500.0000 [IU] | Freq: Once | INTRAVENOUS | Status: AC
  Administered 2014-06-29: 500 [IU] via INTRAVENOUS
  Filled 2014-06-29: qty 5

## 2014-06-29 NOTE — Patient Instructions (Signed)
Magnesium This is a blood test which measures the amount of magnesium in your blood. Most of the magnesium in your body exists in your cells. However, the magnesium in your blood is important for many processes. It is important for your nerves to be able to conduct electrical energy. This is important in heart patients with higher heartbeats. When your heart beats and then gets ready to beat again, it repolarizes. If the magnesium levels are low or high, it can affect the accuracy of the repolarization process. The magnesium levels are also monitored during pregnancy. This is done to determine if the expectant mother may have preeclampsia or toxemia. Magnesium is often used for treatment of these problems. PREPARATION FOR TEST No preparation or fasting is needed. A blood sample may be taken by inserting a needle into a vein in the arm.  NORMAL FINDINGS  Adult: 1.3 to 2.1 mEq/L or 0.65 to 1.05 mmol/L (SI units)  Child: 1.4 to 1.7 mEq/L  Newborn: 1.4 to 2 mEq/L Possible critical values: less than 0.5 mEq/L or greater than 3 mEq/L Ranges for normal findings may vary among different laboratories and hospitals. You should always check with your caregiver after having lab work or other tests done to discuss the meaning of your test results and whether your values are considered within normal limits. MEANING OF TEST  Your caregiver will go over the test results with you. Your caregiver will discuss the importance and meaning of your results. He or she will also discuss treatment options and additional tests, if needed. OBTAINING THE TEST RESULTS It is your responsibility to obtain your test results. Ask the lab or department performing the test when and how you will get your results. Document Released: 01/10/2005 Document Revised: 03/01/2012 Document Reviewed: 11/18/2008 [employer] Patient Information 2015 [employer]. This information is not intended to replace advice given to you by your health care  provider. Make sure you discuss any questions you have with your health care provider.  

## 2014-07-06 ENCOUNTER — Telehealth: Payer: Self-pay | Admitting: *Deleted

## 2014-07-06 ENCOUNTER — Ambulatory Visit (HOSPITAL_BASED_OUTPATIENT_CLINIC_OR_DEPARTMENT_OTHER): Payer: BC Managed Care – PPO | Admitting: Oncology

## 2014-07-06 ENCOUNTER — Other Ambulatory Visit: Payer: Self-pay | Admitting: Oncology

## 2014-07-06 ENCOUNTER — Telehealth: Payer: Self-pay | Admitting: Oncology

## 2014-07-06 ENCOUNTER — Ambulatory Visit (HOSPITAL_BASED_OUTPATIENT_CLINIC_OR_DEPARTMENT_OTHER): Payer: BC Managed Care – PPO

## 2014-07-06 ENCOUNTER — Other Ambulatory Visit (HOSPITAL_BASED_OUTPATIENT_CLINIC_OR_DEPARTMENT_OTHER): Payer: BC Managed Care – PPO

## 2014-07-06 VITALS — BP 131/64 | HR 76 | Temp 98.4°F | Resp 18 | Ht 66.0 in | Wt 213.8 lb

## 2014-07-06 DIAGNOSIS — D801 Nonfamilial hypogammaglobulinemia: Secondary | ICD-10-CM

## 2014-07-06 DIAGNOSIS — D89811 Chronic graft-versus-host disease: Secondary | ICD-10-CM

## 2014-07-06 DIAGNOSIS — C9110 Chronic lymphocytic leukemia of B-cell type not having achieved remission: Secondary | ICD-10-CM

## 2014-07-06 DIAGNOSIS — M549 Dorsalgia, unspecified: Principal | ICD-10-CM

## 2014-07-06 DIAGNOSIS — D899 Disorder involving the immune mechanism, unspecified: Secondary | ICD-10-CM

## 2014-07-06 DIAGNOSIS — T8609 Other complications of bone marrow transplant: Secondary | ICD-10-CM

## 2014-07-06 DIAGNOSIS — M810 Age-related osteoporosis without current pathological fracture: Secondary | ICD-10-CM

## 2014-07-06 DIAGNOSIS — Z9489 Other transplanted organ and tissue status: Secondary | ICD-10-CM

## 2014-07-06 DIAGNOSIS — G8929 Other chronic pain: Secondary | ICD-10-CM

## 2014-07-06 DIAGNOSIS — D849 Immunodeficiency, unspecified: Secondary | ICD-10-CM

## 2014-07-06 DIAGNOSIS — E860 Dehydration: Secondary | ICD-10-CM

## 2014-07-06 DIAGNOSIS — T380X5A Adverse effect of glucocorticoids and synthetic analogues, initial encounter: Secondary | ICD-10-CM

## 2014-07-06 DIAGNOSIS — M818 Other osteoporosis without current pathological fracture: Secondary | ICD-10-CM

## 2014-07-06 DIAGNOSIS — E099 Drug or chemical induced diabetes mellitus without complications: Secondary | ICD-10-CM

## 2014-07-06 LAB — COMPREHENSIVE METABOLIC PANEL (CC13)
ALT: 34 U/L (ref 0–55)
AST: 28 U/L (ref 5–34)
Albumin: 3.8 g/dL (ref 3.5–5.0)
Alkaline Phosphatase: 72 U/L (ref 40–150)
Anion Gap: 7 mEq/L (ref 3–11)
BILIRUBIN TOTAL: 0.34 mg/dL (ref 0.20–1.20)
BUN: 49.7 mg/dL — AB (ref 7.0–26.0)
CO2: 17 mEq/L — ABNORMAL LOW (ref 22–29)
Calcium: 9.4 mg/dL (ref 8.4–10.4)
Chloride: 111 mEq/L — ABNORMAL HIGH (ref 98–109)
Creatinine: 1.4 mg/dL — ABNORMAL HIGH (ref 0.7–1.3)
GLUCOSE: 136 mg/dL (ref 70–140)
Potassium: 5.4 mEq/L — ABNORMAL HIGH (ref 3.5–5.1)
Sodium: 136 mEq/L (ref 136–145)
Total Protein: 6.2 g/dL — ABNORMAL LOW (ref 6.4–8.3)

## 2014-07-06 LAB — CBC WITH DIFFERENTIAL/PLATELET
BASO%: 1.2 % (ref 0.0–2.0)
BASOS ABS: 0.1 10*3/uL (ref 0.0–0.1)
EOS ABS: 0.1 10*3/uL (ref 0.0–0.5)
EOS%: 1 % (ref 0.0–7.0)
HCT: 40.2 % (ref 38.4–49.9)
HEMOGLOBIN: 13.1 g/dL (ref 13.0–17.1)
LYMPH%: 34.8 % (ref 14.0–49.0)
MCH: 30.3 pg (ref 27.2–33.4)
MCHC: 32.6 g/dL (ref 32.0–36.0)
MCV: 93.2 fL (ref 79.3–98.0)
MONO#: 0.5 10*3/uL (ref 0.1–0.9)
MONO%: 8.9 % (ref 0.0–14.0)
NEUT#: 2.9 10*3/uL (ref 1.5–6.5)
NEUT%: 54.1 % (ref 39.0–75.0)
PLATELETS: 177 10*3/uL (ref 140–400)
RBC: 4.32 10*6/uL (ref 4.20–5.82)
RDW: 15.3 % — ABNORMAL HIGH (ref 11.0–14.6)
WBC: 5.4 10*3/uL (ref 4.0–10.3)
lymph#: 1.9 10*3/uL (ref 0.9–3.3)

## 2014-07-06 LAB — MAGNESIUM (CC13): MAGNESIUM: 1.7 mg/dL (ref 1.5–2.5)

## 2014-07-06 MED ORDER — SODIUM CHLORIDE 0.9 % IJ SOLN
10.0000 mL | INTRAMUSCULAR | Status: DC | PRN
  Administered 2014-07-06: 10 mL via INTRAVENOUS
  Filled 2014-07-06: qty 10

## 2014-07-06 MED ORDER — HEPARIN SOD (PORK) LOCK FLUSH 100 UNIT/ML IV SOLN
500.0000 [IU] | Freq: Once | INTRAVENOUS | Status: AC
  Administered 2014-07-06: 500 [IU] via INTRAVENOUS
  Filled 2014-07-06: qty 5

## 2014-07-06 MED ORDER — SODIUM CHLORIDE 0.9 % IV SOLN
1.0000 g | Freq: Once | INTRAVENOUS | Status: AC
  Administered 2014-07-06: 1 g via INTRAVENOUS
  Filled 2014-07-06: qty 2

## 2014-07-06 NOTE — Telephone Encounter (Signed)
Per [facility] staff phone call scheduled appts. Advised schedulers 

## 2014-07-06 NOTE — Telephone Encounter (Signed)
per pof to sch pt appts-cld MW to sch trmt-GM to approve time slot for 1:00-apprvd by GM-gave pt copy of sch

## 2014-07-06 NOTE — Progress Notes (Signed)
ID: Clovis Fredrickson   DOB: 12/22/1946  MR#: [id number]  HDQ#:[id number]  JJH:ERDE, [name], MD SU: OTHER MD: [name]; [name], MD; [name], [name],MD; [name], MD; [facility] in [place], [place]:  [name], RN [phone])  CHIEF COMPLAINT:  CLL, status post allogeneic stem cell transplant, GVHD CURRENT TREATMENT: Immunosuppression  HISTORY OF CLL: From the original intake note:  We have very complete records from Dr. Racheal Patches in [place], and in summary:  The patient was initially diagnosed in August 2000, with a white cell count of 23,600, but normal hemoglobin and platelets, and no significant symptomatology. Over the next several years his white cell count drifted up, and he eventually developed some symptoms of night sweats in particular, leading to treatment with fludarabine, Cytoxan and rituxan for five cycles given between December 2006 and May 2007.  We have CT scans from June 2006, November 2006 and April 2007, and comparing the November 2006 and April 2007 scans, there was near complete response. He had subsequent therapy in [place] as detailed below, but with decreased response, leading to allogeneic stem-cell transplant at the [facility] 02/24/2012.  Subsequent history is as detailed below.  INTERVAL HISTORY: Albert returns today accompanied by his wife [name] for followup of his chronic lymphoid leukemia status post allogenic transplant. Since her last visit here he has had 2 bouts of diarrhea, one of them accompanied by vomiting, this latter 1 associated with eating fried finish. He is now Questran twice daily but has only required Imodium about every other day. That area has since resolved.Marland Kitchen  REVIEW OF SYSTEMS: Mj tells me he is doing "pretty good" overall. He saw Dr. Brigitte Pulse for his physical and Dr. Brigitte Pulse is adjusting his sugar control and working on diet and exercise for the elevated  cholesterol and triglycerides. There he has been reading about addiction and has taken himself off Norco. He is using Voltaren cream for pain. That is working well. He has had no rash and no mouth sores. A detailed review of systems today was otherwise stable   PAST MEDICAL HISTORY: Past Medical History  Diagnosis Date  . 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 Diverticular disease   . Hyperlipidemia   . Obesity   . Hypertension   . Hiatal hernia   . CMV (cytomegalovirus) antibody positive     pre-transplant, with seroconversion x2 pst-transplant  . Right bundle branch block     pre-transplant  . CKD (chronic kidney disease) stage 2, GFR 60-89 ml/min   . Pancytopenia   . Steroid-induced diabetes   . Atrial fibrillation     post-transplant  . Myopathy   . 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 Leukemia, chronic lymphoid   . 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 Diarrhea in adult patient 12/05/2012    Due to active GVHD  . CLL (chronic lymphocytic leukemia) 12/05/2012    Dx 07/1999; started Rx 12/06  AlloBMT 3/13  . Rash of face 12/05/2012    Due to GVHD  . Hypomagnesemia 01/26/2013  . Left hip pain 12/01/2013    PAST SURGICAL HISTORY: Past Surgical History  Procedure Laterality Date  . Tonsillectomy and adenoidectomy    . Bone marrow transplant    . Flexible sigmoidoscopy  11/17/2012    Procedure: FLEXIBLE SIGMOIDOSCOPY;  Surgeon: Jeryl Columbia, MD;  Location: [facility];  Service:  Endoscopy;  Laterality: N/A;  Dr Watt Climes states will be admitted to rooom 1339 11/16/12  . Esophagogastroduodenoscopy  11/17/2012    Procedure: ESOPHAGOGASTRODUODENOSCOPY (EGD);  Surgeon: Jeryl Columbia, MD;  Location: [facility];  Service: Endoscopy;  Laterality: N/A;    FAMILY HISTORY Family History  Problem Relation Age of Onset  . Cancer Father   The patient's father died from  complications of chronic lymphocytic leukemia at the age of 89.  It had been diagnosed seven years before when he was 31.  The patient is enrolled in a familial chronic lymphocytic leukemia study out of the [employer].  The patient's mother is 47, alive, unfortunately suffering with dementia, and he has a brother, 49, who is otherwise in fair health.   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  His wife of >40 years, [name], is a homemaker.  Their daughter, [name], lives in [place].  She also is a Agricultural engineer.  The patient has an 57 year old grandson and an 69-year-old granddaughter, and that is really the main reason he moved to this area.  He is a Tourist information centre manager.     ADVANCED DIRECTIVES: In place  HEALTH MAINTENANCE: (Updated 04/13/2014) History  Substance Use Topics  . Smoking status: Never Smoker   . Smokeless tobacco: Never Used  . Alcohol Use: No     Colonoscopy: Nov 2013, Dr. Watt Climes  PSA: Not on file  Bone density:  Feb 2014;  Patient also has known insufficiency and pathologic fractures in addition to his long-standing history of steroid use.  Lipid panel: Jan 2015, elevated    Allergies  Allergen Reactions  . Benadryl [Diphenhydramine Hcl]     "Restless leg syndrome"    Current Outpatient Prescriptions  Medication Sig Dispense Refill  . acyclovir (ZOVIRAX) 400 MG tablet Take 2 tablets (800 mg total) by mouth 2 (two) times daily.  120 tablet  3  . amoxicillin-clavulanate (AUGMENTIN) 875-125 MG per tablet Take 1 tablet by mouth 4 (four) times daily as needed.  8 tablet  0  . budesonide (ENTOCORT EC) 3 MG 24 hr capsule Take 1 capsule (3 mg total) by mouth 3 (three) times daily.  90 capsule  6  . cholecalciferol (VITAMIN D) 1000 UNITS tablet Take  1,000 Units by mouth every evening.       . cholestyramine (QUESTRAN) 4 G packet Take 1 packet (4 g total) by mouth daily.  30 each  12  . diltiazem (CARDIZEM CD) 240 MG 24 hr capsule       . diltiazem (CARDIZEM CD) 240 MG 24 hr capsule TAKE 1 CAPSULE BY MOUTH DAILY  30 capsule  3  . diltiazem (DILACOR XR) 240 MG 24 hr capsule Take 1 capsule (240 mg total) by mouth every evening.  30 capsule  5  . fluconazole (DIFLUCAN) 100 MG tablet Take 1 tablet (100 mg total) by mouth daily.  30 tablet  6  . furosemide (LASIX) 20 MG tablet Take 1 tablet (20 mg total) by mouth daily.  30 tablet  5  . glucose blood (ONE TOUCH ULTRA TEST) test strip Test before meals and at bedtime.  300 each  0  . HYDROcodone-acetaminophen (NORCO) 10-325 MG per tablet Take 1-2 tablets by mouth every 6 (six) hours as needed for moderate pain.  [place]  tablet  0  . Insulin Aspart Prot & Aspart (NOVOLOG 70/30 MIX) (70-30) 100 UNIT/ML Pen Inject 10 Units into the skin daily with breakfast.  15 mL  10  . Insulin Pen Needle (B-D UF III MINI PEN NEEDLES) 31G X 5 MM MISC Use twice daily with insulin as directed  100 each  5  . labetalol (NORMODYNE) 200 MG tablet Take 400 mg by mouth 2 (two) times daily.       . Lidocaine-Hydrocortisone Ace 3-0.5 % KIT Apply 1 application topically as needed (for pain).  1 each  3  . lidocaine-prilocaine (EMLA) cream Apply 1 application topically as needed. 1-2 hrs before each port access  30 g  6  . lisinopril (PRINIVIL,ZESTRIL) 10 MG tablet Take 10 mg by mouth every evening.       . loratadine (CLARITIN) 10 MG tablet Take 10 mg by mouth daily.      . Multiple Vitamin (MULTIVITAMIN WITH MINERALS) TABS tablet Take 1 tablet by mouth daily.      Marland Kitchen omeprazole (PRILOSEC) 20 MG capsule Take 1 capsule (20 mg total) by mouth daily.  30 capsule  5  . ONETOUCH DELICA LANCETS 94T MISC Test before meals and at bedtime.  300 each  0  . predniSONE (DELTASONE) 5 MG tablet Take 0.5-1 tablets (2.5-5 mg total) by mouth 2  (two) times daily. Takes $RemoveBefo'5mg'hhjKjdnOvwe$  in the morning and 2.$RemoveBefor'5mg'cdeCtoyjlDOP$  in the evening.  45 tablet  2  . sertraline (ZOLOFT) 50 MG tablet ALTERNATE TAKING 1 TABLET DAILY AND 2 TABLETS DAILY  90 tablet  5  . sulfamethoxazole-trimethoprim (BACTRIM DS) 800-160 MG per tablet Take 1 tablet by mouth daily.  30 tablet  5  . tacrolimus (PROGRAF) 0.5 MG capsule TAKE 2 capsules in the morning and 2 in the evening      . VOLTAREN 1 % GEL Apply 2 g topically 2 (two) times daily. Applied to back      . [DISCONTINUED] insulin aspart (NOVOLOG FLEXPEN) 100 UNIT/ML SOPN FlexPen 18units sq qam, 9units sq qpm, or as directed  15 mL  1   No current facility-administered medications for this visit.   Facility-Administered Medications Ordered in Other Visits  Medication Dose Route Frequency Provider Last Rate Last Dose  . 0.9 %  sodium chloride infusion   Intravenous Continuous Amy G Berry, PA-C 500 mL/hr at 03/12/13 0900    . sodium chloride 0.9 % injection 10 mL  10 mL Intravenous PRN Chauncey Cruel, MD   10 mL at 08/11/12 1606    OBJECTIVE: Middle-aged white man who appears stated age 54 Vitals:   07/06/14 1032  BP: 131/64  Pulse: 76  Temp: 98.4 F (36.9 C)  Resp: 18  Body mass index is 34.52 kg/(m^2).  ECOG: 1 Filed Weights   07/06/14 1032  Weight: 213 lb 12.8 oz (96.979 kg)   Sclerae unicteric, pupils round and equal Oropharynx clear and moist-- teeth in good repair No cervical or supraclavicular adenopathy Lungs no rales or rhonchi Heart regular rate and rhythm Abd soft, obese, nontender, positive bowel sounds MSK no focal spinal tenderness, no upper extremity lymphedema Neuro: nonfocal, well oriented, positive affect Skin: Much bruising (though overall improved) but no rash.  LABS:  CBC    Component Value Date/Time   WBC 6.5 06/29/2014 1140   WBC 7.1 04/06/2014 0949   RBC 4.19* 06/29/2014 1140   RBC 4.06* 04/06/2014 0949   RBC 3.82* 03/16/2013 1400   HGB 12.9* 06/29/2014 1140  HGB 12.2* 04/06/2014 0949    HCT 39.3 06/29/2014 1140   HCT 38.5* 04/06/2014 0949   PLT 188 06/29/2014 1140   PLT 144* 04/06/2014 0949   MCV 93.7 06/29/2014 1140   MCV 94.8 04/06/2014 0949   MCH 30.8 06/29/2014 1140   MCH 30.0 04/06/2014 0949   MCHC 32.9 06/29/2014 1140   MCHC 31.7 04/06/2014 0949   RDW 15.3* 06/29/2014 1140   RDW 15.7* 04/06/2014 0949   LYMPHSABS 2.5 06/29/2014 1140   LYMPHSABS 1.4 03/18/2013 0615   MONOABS 0.5 06/29/2014 1140   MONOABS 0.3 03/18/2013 0615   EOSABS 0.0 06/29/2014 1140   EOSABS 0.0 03/18/2013 0615   BASOSABS 0.0 06/29/2014 1140   BASOSABS 0.0 03/18/2013 0615        Chemistry      Component Value Date/Time   NA 138 06/29/2014 1140   NA 131* 06/14/2013 0800   K 5.3* 06/29/2014 1140   K 4.7 06/14/2013 0800   CL 101 06/15/2013 1034   CL 97 06/14/2013 0800   CO2 20* 06/29/2014 1140   CO2 24 06/14/2013 0800   BUN 46.5* 06/29/2014 1140   BUN 38* 06/14/2013 0800   CREATININE 1.3 06/29/2014 1140   CREATININE 0.97 06/14/2013 0800      Component Value Date/Time   CALCIUM 9.2 06/29/2014 1140   CALCIUM 9.1 06/14/2013 0800   ALKPHOS 69 06/29/2014 1140   ALKPHOS 75 04/18/2013 1218   AST 25 06/29/2014 1140   AST 24 04/18/2013 1218   ALT 32 06/29/2014 1140   ALT 33 04/18/2013 1218   BILITOT 0.28 06/29/2014 1140   BILITOT 0.2* 04/18/2013 1218     STUDIES: No results found.  ASSESSMENT: 67 y.o. [place] man with a history of well-differentiated lymphocytic lymphoma/ chronic lymphoid leukemia initially diagnosed in 2000, not requiring intervention until 2006; with multiple chromosomal abnormalities.  His treatment history is as follows:  (1) fludarabine/cyclophosphamide/rituximab x5 completed May 2007.   (2) rituximab for 8 doses October 2010, with partial response   (3) Leustatin and ofatumumab weekly x8 July to September 2011 followed by maintenance ofatumumab  every 2 months, with initial response but rising counts September 2012   (4) status-post unrelated donor stem[facility]cell [facility]24/2012 [facility]  (a)  conditioning regimen consisted of fludarabine + TBI at 200 cGy, followed by rituximab x27;  (b) CMV reactivation x3 (patient CMV positive, donor negative), s/p ganciclovir treatment; 3d reactivation August 2013, s/p gancyclovir, with negative PCR mid-September 2013; last gancyclovir dose 10/06/2012 (c) Chronic GVHD: involving gut and skin, treated with steroids, tacrolimus and MMF.  MMF was eventually d/c'd and tacrolimus currently at a dose of 1.$RemoveB'5mg'LgDwwdMt$  BID (d) atrial fibrillation: resolved on brief amiodarone regimen (e) steroid-induced myopathy: improving  (f) hypomagnesemia: improved after d/c gancyclovir, needs continuing support (g) hypogammaglobulinemia: requiring IVIG most recently 03/15/2014. (h) history of elevated triglycerides (606 on 07/14/2012)  (i) adrenal insufficiency: on prednisone and budesonide (j) pancytopenia,resolved (k) brief episode of neutropenia ([place] 300) February 2015, accompanied by diarrhea; resolved   (5) restaging studies February-March 2015 including CT scans, flow cytometry, and bone marrow biopsy, showed no evidence of residual chronic lymphoid leukemia.  (6) recurrent GVHD (skin rash, mouth changes, severe diarrhea and gastric/duodenal/colonic biopsies 11/17/2012 c/w GVHD grade 2) : now grade 1 to inactive  (7)  malnutrition -- on VITAL supplement in addition to regular diet; on Marinol for anorexia  (8) testosterone deficiency--on patch   (9) deconditioning: Especially quad weakness  (10) mild dehydration: encouraged increased po  fluids; receives IVF support w magnesium weekly  (11) severe steroid-induced osteoporosis with compression fractures: received pamidronate 12/18/2012. Status post kyphoplasty at L3-4 in June 2014. Also with evidence of rib fractures and insufficiency fractures bilaterally of the sacral  alae, noted by CT in March 2015. --   Denosumab started 12/08/2013, given as prolia Q6 months which is what has been approved by his insurance, most  recent dose 03/30/2014  (12) chronic back pain and hip pain controlled with OxyContin and hydrocodone/APAP.  (12) nausea: well controlled on current meds  (13)  Positive c.diff, 03/08/2013, on Flagyl 500 mg TID x 20 days, then on oral vanco with Questran, showing improvement; positive when repeated April 2014; Negative x 3   Results for KAIROS, PANETTA (MRN [id number]) as of 02/02/2014 14:19  Ref. Range 03/08/2013 15:26 03/29/2013 13:45 12/08/2013 11:52 01/18/2014 14:08 01/27/2014 13:31  C difficile by pcr Latest Range: Negative  Positive (AA) Positive (AA) Negative Negative Negative   (14) persistently increased BUN  (15)  Hypertension, on labetalol, cardizem, lisinopril, and furosemide; managed by Dr. Brigitte Pulse  (16) steroid induced hyperglycemia, on sterlix and 70/30 insulin, followed by Dr. Loanne Drilling and Dr. Brigitte Pulse  (17) hypogammaglobulinemia-- requiring intermittent supplementation Results for KELTON, BULTMAN (MRN [id number]) as of 07/06/2014 10:38  Ref. Range 04/13/2014 14:50 05/04/2014 14:03 05/25/2014 11:17 06/01/2014 13:07 06/22/2014 12:01  IgG (Immunoglobin G), Serum Latest Range: [phone] mg/dL 471 (L) 355 (L) 907 769 460 (L)   (18) squamous cell CA in situ removed from left parietal scalp October 2014, second lesion to be removed may 2015   PLAN: Alireza is doing well overall and I think we are going to try again and dropped the tacrolimus. This time we are going to do 1 point 5 in the morning and 1.0 in the evening alternating with 1.5 twice a day. If he tolerates that we will proceed with a taper. So far however every time we dropped the dose he gets more diarrhea and skin problems.  His IgG has trouble 05 100 and I am arranging for him to receive IVIG next week together with his infusion and magnesium. I have encouraged him to continue to exercise 30 tells me he is walking about 20 minutes every day. This is a big step forward.  Dr. Brigitte Pulse is managing his diabetes. Coralyn Mark has a good understanding of  the overall plan. He agrees with it. Heknows the goal of treatment in his case is control. He will call with any problems that may develop before his next visit here.    Chauncey Cruel, MD 07/06/2014

## 2014-07-06 NOTE — Patient Instructions (Signed)
Hypomagnesemia Magnesium is a common ion (mineral) in the body which is needed for metabolism. It is about how the body handles food and other chemical reactions necessary for life. Only about 2% of the magnesium in our body is found in the blood. When this is low, it is called hypomagnesemia. The blood will measure only a tiny amount of the magnesium in our body. When it is low in our blood, it does not mean that the whole body supply is low. The normal serum concentration ranges from 1.8-2.5 mEq/L. When the level gets to be less than 1.0 mEq/L, a number of problems begin to happen.  CAUSES   Receiving intravenous fluids without magnesium replacement.  Loss of magnesium from the bowel by naso-gastric suction.  Loss of magnesium from nausea and vomiting or severe diarrhea. Any of the inflammatory bowel conditions can cause this.  Abuse of alcohol often leads to low serum magnesium.  An inherited form of magnesium loss happens when the kidneys lose magnesium. This is called familial or primary hypomagnesemia.  Some medications such as diuretics also cause the loss of magnesium. SYMPTOMS  These following problems are worse if the changes in magnesium levels come on suddenly.  Tremor.  Confusion.  Muscle weakness.  Over-sensitive to sights and sounds.  Sensitive reflexes.  Depression.  Muscular fibrillations.  Over-reactivity of the nerves.  Irritability.  Psychosis.  Spasms of the hand muscles.  Tetany (where the muscles go into uncontrollable spasms). DIAGNOSIS  This condition can be diagnosed by blood tests. TREATMENT   In emergency, magnesium can be given intravenously (by vein).  If the condition is less worrisome, it can be corrected by diet. High levels of magnesium are found in green leafy vegetables, peas, beans and nuts among other things. It can also be given through medications by mouth.  If it is being caused by medications, changes can be made.  If  alcohol is a problem, help is available if there are difficulties giving it up. Document Released: 09/03/2005 Document Revised: 03/01/2012 Document Reviewed: 07/28/2008 [facility] Patient Information 2015 [place], [place]. This information is not intended to replace advice given to you by your health care provider. Make sure you discuss any questions you have with your health care provider.

## 2014-07-10 ENCOUNTER — Other Ambulatory Visit: Payer: Self-pay | Admitting: Lab

## 2014-07-10 ENCOUNTER — Ambulatory Visit: Payer: Self-pay | Admitting: Oncology

## 2014-07-13 ENCOUNTER — Ambulatory Visit (HOSPITAL_BASED_OUTPATIENT_CLINIC_OR_DEPARTMENT_OTHER): Payer: BC Managed Care – PPO

## 2014-07-13 ENCOUNTER — Other Ambulatory Visit: Payer: Self-pay

## 2014-07-13 ENCOUNTER — Other Ambulatory Visit (HOSPITAL_BASED_OUTPATIENT_CLINIC_OR_DEPARTMENT_OTHER): Payer: BC Managed Care – PPO

## 2014-07-13 DIAGNOSIS — C9110 Chronic lymphocytic leukemia of B-cell type not having achieved remission: Secondary | ICD-10-CM

## 2014-07-13 DIAGNOSIS — T8609 Other complications of bone marrow transplant: Secondary | ICD-10-CM

## 2014-07-13 DIAGNOSIS — D89811 Chronic graft-versus-host disease: Secondary | ICD-10-CM

## 2014-07-13 DIAGNOSIS — Z9489 Other transplanted organ and tissue status: Secondary | ICD-10-CM

## 2014-07-13 DIAGNOSIS — D801 Nonfamilial hypogammaglobulinemia: Secondary | ICD-10-CM

## 2014-07-13 DIAGNOSIS — D809 Immunodeficiency with predominantly antibody defects, unspecified: Secondary | ICD-10-CM

## 2014-07-13 LAB — CBC WITH DIFFERENTIAL/PLATELET
BASO%: 0.9 % (ref 0.0–2.0)
Basophils Absolute: 0 10*3/uL (ref 0.0–0.1)
EOS ABS: 0 10*3/uL (ref 0.0–0.5)
EOS%: 0.8 % (ref 0.0–7.0)
HEMATOCRIT: 39.3 % (ref 38.4–49.9)
HEMOGLOBIN: 12.9 g/dL — AB (ref 13.0–17.1)
LYMPH%: 35.1 % (ref 14.0–49.0)
MCH: 30.2 pg (ref 27.2–33.4)
MCHC: 32.7 g/dL (ref 32.0–36.0)
MCV: 92.5 fL (ref 79.3–98.0)
MONO#: 0.4 10*3/uL (ref 0.1–0.9)
MONO%: 9 % (ref 0.0–14.0)
NEUT#: 2.6 10*3/uL (ref 1.5–6.5)
NEUT%: 54.2 % (ref 39.0–75.0)
Platelets: 170 10*3/uL (ref 140–400)
RBC: 4.25 10*6/uL (ref 4.20–5.82)
RDW: 15.7 % — AB (ref 11.0–14.6)
WBC: 4.9 10*3/uL (ref 4.0–10.3)
lymph#: 1.7 10*3/uL (ref 0.9–3.3)

## 2014-07-13 LAB — COMPREHENSIVE METABOLIC PANEL (CC13)
ALT: 34 U/L (ref 0–55)
AST: 29 U/L (ref 5–34)
Albumin: 3.9 g/dL (ref 3.5–5.0)
Alkaline Phosphatase: 63 U/L (ref 40–150)
Anion Gap: 6 mEq/L (ref 3–11)
BUN: 51.2 mg/dL — ABNORMAL HIGH (ref 7.0–26.0)
CALCIUM: 9.2 mg/dL (ref 8.4–10.4)
CHLORIDE: 115 meq/L — AB (ref 98–109)
CO2: 17 mEq/L — ABNORMAL LOW (ref 22–29)
CREATININE: 1.5 mg/dL — AB (ref 0.7–1.3)
Glucose: 124 mg/dl (ref 70–140)
POTASSIUM: 5.8 meq/L — AB (ref 3.5–5.1)
Sodium: 137 mEq/L (ref 136–145)
Total Bilirubin: 0.34 mg/dL (ref 0.20–1.20)
Total Protein: 6.2 g/dL — ABNORMAL LOW (ref 6.4–8.3)

## 2014-07-13 LAB — LACTATE DEHYDROGENASE (CC13): LDH: 221 U/L (ref 125–245)

## 2014-07-13 LAB — MAGNESIUM (CC13): Magnesium: 1.7 mg/dl (ref 1.5–2.5)

## 2014-07-13 MED ORDER — SODIUM CHLORIDE 0.9 % IJ SOLN
10.0000 mL | INTRAMUSCULAR | Status: DC | PRN
  Administered 2014-07-13: 10 mL via INTRAVENOUS
  Filled 2014-07-13: qty 10

## 2014-07-13 MED ORDER — IMMUNE GLOBULIN (HUMAN) 20 GM/200ML IV SOLN
100.0000 g | Freq: Once | INTRAVENOUS | Status: AC
  Administered 2014-07-13: 100 g via INTRAVENOUS
  Filled 2014-07-13: qty 1000

## 2014-07-13 MED ORDER — SODIUM CHLORIDE 0.9 % IV SOLN
Freq: Once | INTRAVENOUS | Status: AC
  Administered 2014-07-13: 11:00:00 via INTRAVENOUS

## 2014-07-13 MED ORDER — HEPARIN SOD (PORK) LOCK FLUSH 100 UNIT/ML IV SOLN
500.0000 [IU] | Freq: Once | INTRAVENOUS | Status: AC
  Administered 2014-07-13: 500 [IU] via INTRAVENOUS
  Filled 2014-07-13: qty 5

## 2014-07-13 MED ORDER — SODIUM CHLORIDE 0.9 % IV SOLN
1.0000 g | Freq: Once | INTRAVENOUS | Status: AC
  Administered 2014-07-13: 1 g via INTRAVENOUS
  Filled 2014-07-13: qty 2

## 2014-07-13 NOTE — Progress Notes (Signed)
Patient's potassium today is 5.8. Gave patient AVS and copy of lab values, along with list of potassium-enriched foods to limit. Voices understanding.

## 2014-07-13 NOTE — Patient Instructions (Signed)
Immune Globulin Injection (Octagam) What is this medicine? IMMUNE GLOBULIN (im MUNE GLOB yoo lin) helps to prevent or reduce the severity of certain infections in patients who are at risk. This medicine is collected from the pooled blood of many donors. It is used to treat immune system problems, thrombocytopenia, and Kawasaki syndrome. This medicine may be used for other purposes; ask your health care provider or pharmacist if you have questions. COMMON BRAND NAME(S): Baygam, BIVIGAM, Carimune, Carimune NF, Flebogamma, Flebogamma DIF, GamaSTAN S/D, Gamimune N, Gammagard S/D, Gammaked, Gammaplex, Gammar-P IV, Gamunex, Gamunex-C, Hizentra, Iveegam, Iveegam EN, Octagam, Panglobulin, Panglobulin NF, Polygam S/D, Privigen, Sandoglobulin, Venoglobulin-S, Vigam, Vivaglobulin What should I tell my health care provider before I take this medicine? They need to know if you have any of these conditions: - diabetes - extremely low or no immune antibodies in the blood - heart disease - history of blood clots - hyperprolinemia - infection in the blood, sepsis - kidney disease - taking medicine that may change kidney function - ask your health care provider about your medicine - an unusual or allergic reaction to human immune globulin, albumin, maltose, sucrose, polysorbate 80, other medicines, foods, dyes, or preservatives - pregnant or trying to get pregnant - breast-feeding How should I use this medicine? This medicine is for injection into a muscle or infusion into a vein or skin. It is usually given by a health care professional in a hospital or clinic setting. In rare cases, some brands of this medicine might be given at home. You will be taught how to give this medicine. Use exactly as directed. Take your medicine at regular intervals. Do not take your medicine more often than directed. Talk to your pediatrician regarding the use of this medicine in children. Special care may be  needed. Overdosage: If you think you have taken too much of this medicine contact a poison control center or emergency room at once. NOTE: This medicine is only for you. Do not share this medicine with others. What if I miss a dose? It is important not to miss your dose. Call your doctor or health care professional if you are unable to keep an appointment. If you give yourself the medicine and you miss a dose, take it as soon as you can. If it is almost time for your next dose, take only that dose. Do not take double or extra doses. What may interact with this medicine? -aspirin and aspirin-like medicines -cisplatin -cyclosporine -medicines for infection like acyclovir, adefovir, amphotericin B, bacitracin, cidofovir, foscarnet, ganciclovir, gentamicin, pentamidine, vancomycin -NSAIDS, medicines for pain and inflammation, like ibuprofen or naproxen -pamidronate -vaccines -zoledronic acid This list may not describe all possible interactions. Give your health care provider a list of all the medicines, herbs, non-prescription drugs, or dietary supplements you use. Also tell them if you smoke, drink alcohol, or use illegal drugs. Some items may interact with your medicine. What should I watch for while using this medicine? Your condition will be monitored carefully while you are receiving this medicine. This medicine is made from pooled blood donations of many different people. It may be possible to pass an infection in this medicine. However, the donors are screened for infections and all products are tested for HIV and hepatitis. The medicine is treated to kill most or all bacteria and viruses. Talk to your doctor about the risks and benefits of this medicine. Do not have vaccinations for at least 14 days before, or until at least 3 months after receiving  this medicine. What side effects may I notice from receiving this medicine? Side effects that you should report to your doctor or health care  professional as soon as possible: -allergic reactions like skin rash, itching or hives, swelling of the face, lips, or tongue -breathing problems -chest pain or tightness -fever, chills -headache with nausea, vomiting -neck pain or difficulty moving neck -pain when moving eyes -pain, swelling, warmth in the leg -problems with balance, talking, walking -sudden weight gain -swelling of the ankles, feet, hands -trouble passing urine or change in the amount of urine Side effects that usually do not require medical attention (report to your doctor or health care professional if they continue or are bothersome): -dizzy, drowsy -flushing -increased sweating -leg cramps -muscle aches and pains -pain at site where injected This list may not describe all possible side effects. Call your doctor for medical advice about side effects. You may report side effects to FDA at 1-800-FDA-1088. Where should I keep my medicine? Keep out of the reach of children. This drug is usually given in a hospital or clinic and will not be stored at home. In rare cases, some brands of this medicine may be given at home. If you are using this medicine at home, you will be instructed on how to store this medicine. Throw away any unused medicine after the expiration date on the label. NOTE: This sheet is a summary. It may not cover all possible information. If you have questions about this medicine, talk to your doctor, pharmacist, or health care provider.  2015, [employer]. (2009-02-28 11:44:49)  Hypomagnesemia Magnesium is a common ion (mineral) in the body which is needed for metabolism. It is about how the body handles food and other chemical reactions necessary for life. Only about 2% of the magnesium in our body is found in the blood. When this is low, it is called hypomagnesemia. The blood will measure only a tiny amount of the magnesium in our body. When it is low in our blood, it does not mean that  the whole body supply is low. The normal serum concentration ranges from 1.8-2.5 mEq/L. When the level gets to be less than 1.0 mEq/L, a number of problems begin to happen.  CAUSES   Receiving intravenous fluids without magnesium replacement.  Loss of magnesium from the bowel by nasogastric suction.  Loss of magnesium from nausea and vomiting or severe diarrhea. Any of the inflammatory bowel conditions can cause this.  Abuse of alcohol often leads to low serum magnesium.  An inherited form of magnesium loss happens when the kidneys lose magnesium. This is called familial or primary hypomagnesemia.  Some medications such as diuretics also cause the loss of magnesium. SYMPTOMS  These following problems are worse if the changes in magnesium levels come on suddenly.  Tremor.  Confusion.  Muscle weakness.  Oversensitive to sights and sounds.  Sensitive reflexes.  Depression.  Muscular fibrillations.  Overreactivity of the nerves.  Irritability.  Psychosis.  Spasms of the hand muscles.  Tetany (where the muscles go into uncontrollable spasms). DIAGNOSIS  This condition can be diagnosed by blood tests. TREATMENT   In an emergency, magnesium can be given intravenously (by vein).  If the condition is less worrisome, it can be corrected by diet. High levels of magnesium are found in green leafy vegetables, peas, beans, and nuts among other things. It can also be given through medications by mouth.  If it is being caused by medications, changes can be made.  If alcohol is a problem, help is available if there are difficulties giving it up. Document Released: 09/03/2005 Document Revised: 04/24/2014 Document Reviewed: 07/28/2008 [facility] Patient Information 2015 [place], [place]. This information is not intended to replace advice given to you by your health care provider. Make sure you discuss any questions you have with your health care provider.

## 2014-07-14 LAB — IGG, IGA, IGM
IGG (IMMUNOGLOBIN G), SERUM: 339 mg/dL — AB (ref 650–1600)
IgA: 7 mg/dL — ABNORMAL LOW (ref 68–379)
IgM, Serum: <5 mg/dL — ABNORMAL LOW (ref 41–251)

## 2014-07-14 LAB — TACROLIMUS LEVEL: Tacrolimus Lvl: 14.3 ng/mL (ref 5.0–20.0)

## 2014-07-20 ENCOUNTER — Other Ambulatory Visit: Payer: Self-pay | Admitting: *Deleted

## 2014-07-20 ENCOUNTER — Telehealth: Payer: Self-pay | Admitting: *Deleted

## 2014-07-20 ENCOUNTER — Ambulatory Visit (HOSPITAL_BASED_OUTPATIENT_CLINIC_OR_DEPARTMENT_OTHER): Payer: BC Managed Care – PPO

## 2014-07-20 ENCOUNTER — Other Ambulatory Visit (HOSPITAL_BASED_OUTPATIENT_CLINIC_OR_DEPARTMENT_OTHER): Payer: BC Managed Care – PPO

## 2014-07-20 ENCOUNTER — Encounter: Payer: Self-pay | Admitting: Oncology

## 2014-07-20 DIAGNOSIS — C9110 Chronic lymphocytic leukemia of B-cell type not having achieved remission: Secondary | ICD-10-CM

## 2014-07-20 LAB — COMPREHENSIVE METABOLIC PANEL (CC13)
ALK PHOS: 54 U/L (ref 40–150)
ALT: 28 U/L (ref 0–55)
ANION GAP: 7 meq/L (ref 3–11)
AST: 31 U/L (ref 5–34)
Albumin: 3.6 g/dL (ref 3.5–5.0)
BILIRUBIN TOTAL: 0.25 mg/dL (ref 0.20–1.20)
BUN: 43.9 mg/dL — AB (ref 7.0–26.0)
CO2: 15 mEq/L — ABNORMAL LOW (ref 22–29)
CREATININE: 1.3 mg/dL (ref 0.7–1.3)
Calcium: 9 mg/dL (ref 8.4–10.4)
Chloride: 114 mEq/L — ABNORMAL HIGH (ref 98–109)
GLUCOSE: 147 mg/dL — AB (ref 70–140)
Potassium: 5.1 mEq/L (ref 3.5–5.1)
SODIUM: 137 meq/L (ref 136–145)
Total Protein: 6.8 g/dL (ref 6.4–8.3)

## 2014-07-20 LAB — CBC WITH DIFFERENTIAL/PLATELET
BASO%: 1.2 % (ref 0.0–2.0)
BASOS ABS: 0.1 10*3/uL (ref 0.0–0.1)
EOS%: 0.8 % (ref 0.0–7.0)
Eosinophils Absolute: 0 10*3/uL (ref 0.0–0.5)
HCT: 36.8 % — ABNORMAL LOW (ref 38.4–49.9)
HGB: 12.1 g/dL — ABNORMAL LOW (ref 13.0–17.1)
LYMPH%: 45.7 % (ref 14.0–49.0)
MCH: 30.2 pg (ref 27.2–33.4)
MCHC: 32.9 g/dL (ref 32.0–36.0)
MCV: 91.8 fL (ref 79.3–98.0)
MONO#: 0.5 10*3/uL (ref 0.1–0.9)
MONO%: 11.8 % (ref 0.0–14.0)
NEUT#: 1.8 10*3/uL (ref 1.5–6.5)
NEUT%: 40.5 % (ref 39.0–75.0)
Platelets: 141 10*3/uL (ref 140–400)
RBC: 4.01 10*6/uL — AB (ref 4.20–5.82)
RDW: 15.5 % — ABNORMAL HIGH (ref 11.0–14.6)
WBC: 4.4 10*3/uL (ref 4.0–10.3)
lymph#: 2 10*3/uL (ref 0.9–3.3)

## 2014-07-20 LAB — MAGNESIUM (CC13): Magnesium: 1.6 mg/dl (ref 1.5–2.5)

## 2014-07-20 MED ORDER — SODIUM CHLORIDE 0.9 % IV SOLN
1.0000 g | Freq: Once | INTRAVENOUS | Status: DC
  Filled 2014-07-20: qty 2

## 2014-07-20 MED ORDER — SODIUM CHLORIDE 0.9 % IV SOLN
1.0000 g | Freq: Once | INTRAVENOUS | Status: AC
  Administered 2014-07-20: 1 g via INTRAVENOUS
  Filled 2014-07-20: qty 2

## 2014-07-20 NOTE — Patient Instructions (Signed)
Hypomagnesemia Magnesium is a common ion (mineral) in the body which is needed for metabolism. It is about how the body handles food and other chemical reactions necessary for life. Only about 2% of the magnesium in our body is found in the blood. When this is low, it is called hypomagnesemia. The blood will measure only a tiny amount of the magnesium in our body. When it is low in our blood, it does not mean that the whole body supply is low. The normal serum concentration ranges from 1.8-2.5 mEq/L. When the level gets to be less than 1.0 mEq/L, a number of problems begin to happen.  CAUSES   Receiving intravenous fluids without magnesium replacement.  Loss of magnesium from the bowel by nasogastric suction.  Loss of magnesium from nausea and vomiting or severe diarrhea. Any of the inflammatory bowel conditions can cause this.  Abuse of alcohol often leads to low serum magnesium.  An inherited form of magnesium loss happens when the kidneys lose magnesium. This is called familial or primary hypomagnesemia.  Some medications such as diuretics also cause the loss of magnesium. SYMPTOMS  These following problems are worse if the changes in magnesium levels come on suddenly.  Tremor.  Confusion.  Muscle weakness.  Oversensitive to sights and sounds.  Sensitive reflexes.  Depression.  Muscular fibrillations.  Overreactivity of the nerves.  Irritability.  Psychosis.  Spasms of the hand muscles.  Tetany (where the muscles go into uncontrollable spasms). DIAGNOSIS  This condition can be diagnosed by blood tests. TREATMENT   In an emergency, magnesium can be given intravenously (by vein).  If the condition is less worrisome, it can be corrected by diet. High levels of magnesium are found in green leafy vegetables, peas, beans, and nuts among other things. It can also be given through medications by mouth.  If it is being caused by medications, changes can be made.  If  alcohol is a problem, help is available if there are difficulties giving it up. Document Released: 09/03/2005 Document Revised: 04/24/2014 Document Reviewed: 07/28/2008 [employer] Patient Information 2015 [employer]. This information is not intended to replace advice given to you by your health care provider. Make sure you discuss any questions you have with your health care provider.  

## 2014-07-20 NOTE — Progress Notes (Signed)
[facility], [id number], approved omeprazole from 06/20/14-07/20/15

## 2014-07-20 NOTE — Telephone Encounter (Signed)
Received prior auth request for omeprazole from [facility].  Placed in [facility] mail box/[place].

## 2014-07-24 ENCOUNTER — Other Ambulatory Visit: Payer: Self-pay | Admitting: Dermatology

## 2014-07-26 ENCOUNTER — Telehealth: Payer: Self-pay | Admitting: Oncology

## 2014-07-26 NOTE — Telephone Encounter (Signed)
Pt cld to see what time appt was for 8/6-advised pt 12:30-pt understood

## 2014-07-27 ENCOUNTER — Ambulatory Visit (HOSPITAL_BASED_OUTPATIENT_CLINIC_OR_DEPARTMENT_OTHER): Payer: BC Managed Care – PPO

## 2014-07-27 ENCOUNTER — Other Ambulatory Visit: Payer: Self-pay

## 2014-07-27 ENCOUNTER — Other Ambulatory Visit (HOSPITAL_BASED_OUTPATIENT_CLINIC_OR_DEPARTMENT_OTHER): Payer: BC Managed Care – PPO

## 2014-07-27 ENCOUNTER — Ambulatory Visit (HOSPITAL_BASED_OUTPATIENT_CLINIC_OR_DEPARTMENT_OTHER): Payer: BC Managed Care – PPO | Admitting: Oncology

## 2014-07-27 ENCOUNTER — Telehealth: Payer: Self-pay | Admitting: Oncology

## 2014-07-27 ENCOUNTER — Telehealth: Payer: Self-pay | Admitting: *Deleted

## 2014-07-27 VITALS — BP 143/88 | HR 69 | Temp 98.2°F | Resp 18 | Ht 66.0 in | Wt 214.2 lb

## 2014-07-27 DIAGNOSIS — D801 Nonfamilial hypogammaglobulinemia: Secondary | ICD-10-CM

## 2014-07-27 DIAGNOSIS — C9110 Chronic lymphocytic leukemia of B-cell type not having achieved remission: Secondary | ICD-10-CM

## 2014-07-27 DIAGNOSIS — D89811 Chronic graft-versus-host disease: Secondary | ICD-10-CM

## 2014-07-27 DIAGNOSIS — D849 Immunodeficiency, unspecified: Secondary | ICD-10-CM

## 2014-07-27 DIAGNOSIS — E099 Drug or chemical induced diabetes mellitus without complications: Secondary | ICD-10-CM

## 2014-07-27 DIAGNOSIS — D899 Disorder involving the immune mechanism, unspecified: Secondary | ICD-10-CM

## 2014-07-27 DIAGNOSIS — G8929 Other chronic pain: Secondary | ICD-10-CM

## 2014-07-27 DIAGNOSIS — M810 Age-related osteoporosis without current pathological fracture: Secondary | ICD-10-CM

## 2014-07-27 DIAGNOSIS — Z9489 Other transplanted organ and tissue status: Secondary | ICD-10-CM

## 2014-07-27 DIAGNOSIS — E860 Dehydration: Secondary | ICD-10-CM

## 2014-07-27 DIAGNOSIS — T8609 Other complications of bone marrow transplant: Secondary | ICD-10-CM

## 2014-07-27 DIAGNOSIS — M549 Dorsalgia, unspecified: Secondary | ICD-10-CM

## 2014-07-27 DIAGNOSIS — T380X5A Adverse effect of glucocorticoids and synthetic analogues, initial encounter: Secondary | ICD-10-CM

## 2014-07-27 LAB — MAGNESIUM (CC13): Magnesium: 1.7 mg/dl (ref 1.5–2.5)

## 2014-07-27 LAB — CBC WITH DIFFERENTIAL/PLATELET
BASO%: 0.5 % (ref 0.0–2.0)
BASOS ABS: 0 10*3/uL (ref 0.0–0.1)
EOS%: 0.8 % (ref 0.0–7.0)
Eosinophils Absolute: 0 10*3/uL (ref 0.0–0.5)
HEMATOCRIT: 39.3 % (ref 38.4–49.9)
HGB: 12.8 g/dL — ABNORMAL LOW (ref 13.0–17.1)
LYMPH%: 40.7 % (ref 14.0–49.0)
MCH: 29.9 pg (ref 27.2–33.4)
MCHC: 32.6 g/dL (ref 32.0–36.0)
MCV: 91.6 fL (ref 79.3–98.0)
MONO#: 0.5 10*3/uL (ref 0.1–0.9)
MONO%: 9.5 % (ref 0.0–14.0)
NEUT#: 2.3 10*3/uL (ref 1.5–6.5)
NEUT%: 48.5 % (ref 39.0–75.0)
PLATELETS: 141 10*3/uL (ref 140–400)
RBC: 4.29 10*6/uL (ref 4.20–5.82)
RDW: 15.4 % — ABNORMAL HIGH (ref 11.0–14.6)
WBC: 4.8 10*3/uL (ref 4.0–10.3)
lymph#: 1.9 10*3/uL (ref 0.9–3.3)

## 2014-07-27 LAB — COMPREHENSIVE METABOLIC PANEL (CC13)
ALK PHOS: 61 U/L (ref 40–150)
ALT: 46 U/L (ref 0–55)
AST: 35 U/L — ABNORMAL HIGH (ref 5–34)
Albumin: 3.6 g/dL (ref 3.5–5.0)
Anion Gap: 8 mEq/L (ref 3–11)
BUN: 44.8 mg/dL — ABNORMAL HIGH (ref 7.0–26.0)
CALCIUM: 9.4 mg/dL (ref 8.4–10.4)
CO2: 18 mEq/L — ABNORMAL LOW (ref 22–29)
CREATININE: 1.5 mg/dL — AB (ref 0.7–1.3)
Chloride: 111 mEq/L — ABNORMAL HIGH (ref 98–109)
Glucose: 163 mg/dl — ABNORMAL HIGH (ref 70–140)
Potassium: 5.6 mEq/L — ABNORMAL HIGH (ref 3.5–5.1)
Sodium: 137 mEq/L (ref 136–145)
Total Bilirubin: 0.41 mg/dL (ref 0.20–1.20)
Total Protein: 6.6 g/dL (ref 6.4–8.3)

## 2014-07-27 MED ORDER — SODIUM CHLORIDE 0.9 % IJ SOLN
10.0000 mL | INTRAMUSCULAR | Status: DC | PRN
  Administered 2014-07-27: 10 mL via INTRAVENOUS
  Filled 2014-07-27: qty 10

## 2014-07-27 MED ORDER — HEPARIN SOD (PORK) LOCK FLUSH 100 UNIT/ML IV SOLN
500.0000 [IU] | Freq: Once | INTRAVENOUS | Status: AC | PRN
  Administered 2014-07-27: 500 [IU]
  Filled 2014-07-27: qty 5

## 2014-07-27 MED ORDER — LIDOCAINE 5 % EX PTCH: 1.0000 | MEDICATED_PATCH | CUTANEOUS | Status: DC

## 2014-07-27 MED ORDER — SODIUM CHLORIDE 0.9 % IV SOLN
1.0000 g | Freq: Once | INTRAVENOUS | Status: AC
  Administered 2014-07-27: 1 g via INTRAVENOUS
  Filled 2014-07-27: qty 2

## 2014-07-27 MED ORDER — HYDROCODONE-ACETAMINOPHEN 7.5-325 MG/15ML PO SOLN
10.0000 mL | Freq: Four times a day (QID) | ORAL | Status: DC | PRN
Start: 2014-07-27 — End: 2014-08-10

## 2014-07-27 MED ORDER — SODIUM CHLORIDE 0.9 % IV SOLN
Freq: Once | INTRAVENOUS | Status: AC
  Administered 2014-07-27: 14:00:00 via INTRAVENOUS

## 2014-07-27 NOTE — Telephone Encounter (Signed)
Per [facility] staff message scheduled appts. Advised scheduler 

## 2014-07-27 NOTE — Progress Notes (Signed)
ID: Clovis Fredrickson   DOB: 1946/08/21  MR#: [id number]  JXB#:[id number]  [id number], [name], MD SU: OTHER MD: [name]; [name], MD; [name], [name],MD; [name], MD; [facility], [place]:  [name], RN [phone])  CHIEF COMPLAINT:  CLL, status post allogeneic stem cell transplant, GVHD CURRENT TREATMENT: Immunosuppression  HISTORY OF CLL: From the original intake note:  We have very complete records from Dr. Racheal Patches in [place], and in summary:  The patient was initially diagnosed in August 2000, with a white cell count of 23,600, but normal hemoglobin and platelets, and no significant symptomatology. Over the next several years his white cell count drifted up, and he eventually developed some symptoms of night sweats in particular, leading to treatment with fludarabine, Cytoxan and rituxan for five cycles given between December 2006 and May 2007.  We have CT scans from June 2006, November 2006 and April 2007, and comparing the November 2006 and April 2007 scans, there was near complete response. He had subsequent therapy in [place] as detailed below, but with decreased response, leading to allogeneic stem[facility]cell transplant at the [facility] 02/24/2012.  Subsequent history is as detailed below.  INTERVAL HISTORY: Anthonio returns for followup of his chronic lymphoid leukemia status post allogenic transplant. After the last visit here we tried to drop the tacrolimus level minimally. He went from 1 point 5 in the morning and evening to 1.5 the morning and 1.0 in the evening. Unfortunately this resulted in a diarrhea, which lasted for about 24 hours. He promptly went back to his earlier tacrolimus level and for the past 5 days has had his usual 3 bowel movements a day, fairly formed, and no "surprises".  REVIEW OF SYSTEMS: Sal continues to exercise and his quads are getting a little bit  stronger. He is having more back pain, however. He wonders if he is developing another compression fracture, although we are treating him with probably every 6 months. He's had further work on his scalp squamous cell carcinoma and had a couple of right temporal lesions biopsied by dermatology. There has been no fever, mouth sores, or other significant changes. He was scheduled to go back to [place] in September but has decided not to go until February of next year. This means we can go ahead and give him some IV IG. Aside from all the tissues a detailed review of systems today was stable   PAST MEDICAL HISTORY: Past Medical History  Diagnosis Date  . 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 Diverticular disease   . Hyperlipidemia   . Obesity   . Hypertension   . Hiatal hernia   . CMV (cytomegalovirus) antibody positive     pre-transplant, with seroconversion x2 pst-transplant  . Right bundle branch block     pre-transplant  . CKD (chronic kidney disease) stage 2, GFR 60-89 ml/min   . Pancytopenia   . Steroid-induced diabetes   . Atrial fibrillation     post-transplant  . Myopathy   . 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 Leukemia, chronic lymphoid   . 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 Diarrhea in adult patient 12/05/2012    Due to active GVHD  . CLL (chronic lymphocytic leukemia) 12/05/2012    Dx 07/1999; started Rx 12/06  AlloBMT 3/13  . Rash of face 12/05/2012    Due to GVHD  .  Hypomagnesemia 01/26/2013  . Left hip pain 12/01/2013    PAST SURGICAL HISTORY: Past Surgical History  Procedure Laterality Date  . Tonsillectomy and adenoidectomy    . Bone marrow transplant    . Flexible sigmoidoscopy  11/17/2012    Procedure: FLEXIBLE SIGMOIDOSCOPY;  Surgeon: Jeryl Columbia, MD;  Location: [facility] ENDOSCOPY;  Service: Endoscopy;  Laterality: N/A;  Dr Watt Climes states will be  admitted to [facility] 11/16/12  . Esophagogastroduodenoscopy  11/17/2012    Procedure: ESOPHAGOGASTRODUODENOSCOPY (EGD);  Surgeon: Jeryl Columbia, MD;  Location: [facility];  Service: Endoscopy;  Laterality: N/A;    FAMILY HISTORY Family History  Problem Relation Age of Onset  . Cancer Father   The patient's father died from complications of chronic lymphocytic leukemia at the age of 6.  It had been diagnosed seven years before when he was 74.  The patient is enrolled in a familial chronic lymphocytic leukemia study out of the [employer].  The patient's mother is 65, alive, unfortunately suffering with dementia, and he has a brother, 13, who is otherwise in fair health.   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  His wife of >40 years, [name], is a homemaker.  Their daughter, [name], lives in [place].  She also is a Agricultural engineer.  The patient has an 88 year old grandson and an 53-year-old granddaughter, and that is really the main reason he moved to this area.  He is a Tourist information centre manager.     ADVANCED DIRECTIVES: In place  HEALTH MAINTENANCE: (Updated 04/13/2014) History  Substance Use Topics  . Smoking status: Never Smoker   . Smokeless tobacco: Never Used  . Alcohol Use: No     Colonoscopy: Nov 2013, Dr. Watt Climes  PSA: Not on file  Bone density:  Feb 2014;  Patient also has known insufficiency and pathologic fractures in addition to his long-standing history of steroid use.  Lipid panel: Jan 2015, elevated    Allergies  Allergen Reactions  . Benadryl [Diphenhydramine Hcl]     "Restless leg syndrome"    Current Outpatient Prescriptions  Medication Sig Dispense Refill  . acyclovir (ZOVIRAX) 400 MG tablet Take 2 tablets (800 mg total) by mouth 2 (two)  times daily.  120 tablet  3  . amoxicillin-clavulanate (AUGMENTIN) 875-125 MG per tablet Take 1 tablet by mouth 4 (four) times daily as needed.  8 tablet  0  . budesonide (ENTOCORT EC) 3 MG 24 hr capsule Take 1 capsule (3 mg total) by mouth 3 (three) times daily.  90 capsule  6  . cholecalciferol (VITAMIN D) 1000 UNITS tablet Take 1,000 Units by mouth every evening.       . cholestyramine (QUESTRAN) 4 G packet Take 1 packet (4 g total) by mouth daily.  30 each  12  . diltiazem (CARDIZEM CD) 240 MG 24 hr capsule       . diltiazem (CARDIZEM CD) 240 MG 24 hr capsule TAKE 1 CAPSULE BY MOUTH DAILY  30 capsule  3  . diltiazem (DILACOR XR) 240 MG 24 hr capsule Take 1 capsule (240 mg total) by mouth every evening.  30 capsule  5  . fluconazole (DIFLUCAN) 100 MG tablet Take 1 tablet (100 mg total) by mouth daily.  30 tablet  6  . furosemide (LASIX) 20 MG tablet  Take 1 tablet (20 mg total) by mouth daily.  30 tablet  5  . glucose blood (ONE TOUCH ULTRA TEST) test strip Test before meals and at bedtime.  300 each  0  . Insulin Aspart Prot & Aspart (NOVOLOG 70/30 MIX) (70-30) 100 UNIT/ML Pen Inject 10 Units into the skin 2 (two) times daily. 10 units with breakfast, 10 units with dinner      . Insulin Pen Needle (B-D UF III MINI PEN NEEDLES) 31G X 5 MM MISC Use twice daily with insulin as directed  100 each  5  . labetalol (NORMODYNE) 200 MG tablet Take 400 mg by mouth 2 (two) times daily.       . Lidocaine-Hydrocortisone Ace 3-0.5 % KIT Apply 1 application topically as needed (for pain).  1 each  3  . lidocaine-prilocaine (EMLA) cream Apply 1 application topically as needed. 1-2 hrs before each port access  30 g  6  . lisinopril (PRINIVIL,ZESTRIL) 10 MG tablet Take 10 mg by mouth every evening.       . loratadine (CLARITIN) 10 MG tablet Take 10 mg by mouth daily.      . Multiple Vitamin (MULTIVITAMIN WITH MINERALS) TABS tablet Take 1 tablet by mouth daily.      Marland Kitchen omeprazole (PRILOSEC) 20 MG capsule Take 1  capsule (20 mg total) by mouth daily.  30 capsule  5  . ONETOUCH DELICA LANCETS 37C MISC Test before meals and at bedtime.  300 each  0  . predniSONE (DELTASONE) 5 MG tablet Take 0.5-1 tablets (2.5-5 mg total) by mouth 2 (two) times daily. Takes 31m in the morning and 2.512min the evening.  45 tablet  2  . sertraline (ZOLOFT) 50 MG tablet ALTERNATE TAKING 1 TABLET DAILY AND 2 TABLETS DAILY  90 tablet  5  . sulfamethoxazole-trimethoprim (BACTRIM DS) 800-160 MG per tablet Take 1 tablet by mouth daily.  30 tablet  5  . tacrolimus (PROGRAF) 0.5 MG capsule TAKE 2 capsules in the morning and 2 in the evening      . vancomycin (VANCOCIN) 125 MG capsule       . VOLTAREN 1 % GEL Apply 2 g topically 2 (two) times daily. Applied to back      . [DISCONTINUED] insulin aspart (NOVOLOG FLEXPEN) 100 UNIT/ML SOPN FlexPen 18units sq qam, 9units sq qpm, or as directed  15 mL  1   No current facility-administered medications for this visit.   Facility-Administered Medications Ordered in Other Visits  Medication Dose Route Frequency Provider Last Rate Last Dose  . 0.9 %  sodium chloride infusion   Intravenous Continuous Amy G Berry, PA-C 500 mL/hr at 03/12/13 0900    . sodium chloride 0.9 % injection 10 mL  10 mL Intravenous PRN GuChauncey CruelMD   10 mL at 08/11/12 1606    OBJECTIVE: Middle-aged white man in no acute distress Filed Vitals:   07/27/14 1317  BP: 143/88  Pulse: 69  Temp: 98.2 F (36.8 C)  Resp: 18  Body mass index is 34.6 kg/(m^2).  ECOG: 1 Filed Weights   07/27/14 1317  Weight: 214 lb 4 oz (97.183 kg)   Sclerae unicteric, EOMs intact Oropharynx clear-- teeth in good repair--no thrush or other lesions No cervical or supraclavicular adenopathy Lungs no rales or rhonchi Heart regular rate and rhythm Abd soft, obese, nontender, positive bowel sounds MSK no focal spinal tenderness, no upper extremity lymphedema Neuro: nonfocal, well oriented, positive affect Skin: Multiple ecchymosis  as before him a with atrophic skin; no obvious rash  LABS:  CBC    Component Value Date/Time   WBC 4.8 07/27/2014 1257   WBC 7.1 04/06/2014 0949   RBC 4.29 07/27/2014 1257   RBC 4.06* 04/06/2014 0949   RBC 3.82* 03/16/2013 1400   HGB 12.8* 07/27/2014 1257   HGB 12.2* 04/06/2014 0949   HCT 39.3 07/27/2014 1257   HCT 38.5* 04/06/2014 0949   PLT 141 07/27/2014 1257   PLT 144* 04/06/2014 0949   MCV 91.6 07/27/2014 1257   MCV 94.8 04/06/2014 0949   MCH 29.9 07/27/2014 1257   MCH 30.0 04/06/2014 0949   MCHC 32.6 07/27/2014 1257   MCHC 31.7 04/06/2014 0949   RDW 15.4* 07/27/2014 1257   RDW 15.7* 04/06/2014 0949   LYMPHSABS 1.9 07/27/2014 1257   LYMPHSABS 1.4 03/18/2013 0615   MONOABS 0.5 07/27/2014 1257   MONOABS 0.3 03/18/2013 0615   EOSABS 0.0 07/27/2014 1257   EOSABS 0.0 03/18/2013 0615   BASOSABS 0.0 07/27/2014 1257   BASOSABS 0.0 03/18/2013 0615        Chemistry      Component Value Date/Time   NA 137 07/20/2014 1227   NA 131* 06/14/2013 0800   K 5.1 07/20/2014 1227   K 4.7 06/14/2013 0800   CL 101 06/15/2013 1034   CL 97 06/14/2013 0800   CO2 15* 07/20/2014 1227   CO2 24 06/14/2013 0800   BUN 43.9* 07/20/2014 1227   BUN 38* 06/14/2013 0800   CREATININE 1.3 07/20/2014 1227   CREATININE 0.97 06/14/2013 0800      Component Value Date/Time   CALCIUM 9.0 07/20/2014 1227   CALCIUM 9.1 06/14/2013 0800   ALKPHOS 54 07/20/2014 1227   ALKPHOS 75 04/18/2013 1218   AST 31 07/20/2014 1227   AST 24 04/18/2013 1218   ALT 28 07/20/2014 1227   ALT 33 04/18/2013 1218   BILITOT 0.25 07/20/2014 1227   BILITOT 0.2* 04/18/2013 1218     STUDIES: No results found.  ASSESSMENT: 68 y.o. [place] man with a history of well-differentiated lymphocytic lymphoma/ chronic lymphoid leukemia initially diagnosed in 2000, not requiring intervention until 2006; with multiple chromosomal abnormalities.  His treatment history is as follows:  (1) fludarabine/cyclophosphamide/rituximab x5 completed May 2007.   (2) rituximab for 8 doses October  2010, with partial response   (3) Leustatin and ofatumumab weekly x8 July to September 2011 followed by maintenance ofatumumab  every 2 months, with initial response but rising counts September 2012   (4) status-post unrelated donor stem-cell transplant 02/24/2012 at [facility]  (a) conditioning regimen consisted of fludarabine + TBI at 200 cGy, followed by rituximab x27;  (b) CMV reactivation x3 (patient CMV positive, donor negative), s/p ganciclovir treatment; 3d reactivation August 2013, s/p gancyclovir, with negative PCR mid-September 2013; last gancyclovir dose 10/06/2012 (c) Chronic GVHD: involving gut and skin, treated with steroids, tacrolimus and MMF.  MMF was eventually d/c'd and tacrolimus currently at a dose of 1.34m BID (d) atrial fibrillation: resolved on brief amiodarone regimen (e) steroid-induced myopathy: improving  (f) hypomagnesemia: improved after d/c gancyclovir, needs continuing support (g) hypogammaglobulinemia: requiring IVIG most recently 03/15/2014. (h) history of elevated triglycerides (606 on 07/14/2012)  (i) adrenal insufficiency: on prednisone and budesonide (j) pancytopenia,resolved (k) brief episode of neutropenia ([id number]) February 2015, accompanied by diarrhea; resolved   (5) restaging studies February-March 2015 including CT scans, flow cytometry, and bone marrow biopsy, showed no evidence of residual chronic lymphoid leukemia.  (6) recurrent  GVHD (skin rash, mouth changes, severe diarrhea and gastric/duodenal/colonic biopsies 11/17/2012 c/w GVHD grade 2) : now grade 1 to inactive  (7)  malnutrition -- on VITAL supplement in addition to regular diet; on Marinol for anorexia  (8) testosterone deficiency--on patch   (9) deconditioning: Especially quad weakness; continuing rehabilitation exercises  (10) mild dehydration: encouraged increased po fluids; receives IVF support w magnesium weekly  (11) severe steroid-induced osteoporosis with compression  fractures: received pamidronate 12/18/2012. Status post kyphoplasty at L3-4 in June 2014. Also with evidence of rib fractures and insufficiency fractures bilaterally of the sacral  alae, noted by CT in March 2015. --   Denosumab started 12/08/2013, given as prolia Q6 months which is what has been approved by his insurance, most recent dose 03/30/2014  (12) chronic back pain and hip pain controlled with OxyContin and hydrocodone/APAP.  (12) nausea: well controlled on current meds  (13)  Positive c.diff, 03/08/2013, on Flagyl 500 mg TID x 20 days, then on oral vanco with Questran, showing improvement; positive when repeated April 2014; Negative x 3   (14) persistently increased BUN and potassium  (15)  Hypertension, on labetalol, cardizem, lisinopril, and furosemide; managed by Dr. Brigitte Pulse  (16) steroid induced hyperglycemia, on sterlix and 70/30 insulin, followed by Dr. Loanne Drilling and Dr. Brigitte Pulse  (17) hypogammaglobulinemia-- requiring intermittent supplementation Results for GARRET, TEALE (MRN [id number]) as of 07/27/2014 13:21  Ref. Range 05/04/2014 14:03 05/25/2014 11:17 06/01/2014 13:07 06/22/2014 12:01 07/13/2014 10:39  IgG (Immunoglobin G), Serum Latest Range: [phone] mg/dL 355 (L) [phone] (L) 339 (L)   (18) squamous cell CA in situ removed from left parietal scalp October 2014, second lesion to be removed may 2015   PLAN: Ronen continues to have graft-versus-host disease, which is generally well controlled. However every time we make a minimal change in his tacrolimus level he starts getting symptoms. I think it would be wise to wait a couple of months before trying again to lower his immunosuppression. Accordingly he is continue on the same tacrolimus and steroid doses as current.  He is having more back pain. He may be developing another compression fracture. I am going to obtain an MRI of the thoracolumbar spine and if there is new fracture we can consider another kyphoplasty procedure. He is  onProlia every 6 months. Today I refilled his hydrocodone and wrote for Lidoderm patches. He will let us know if those work for him  He thinks there may be some potassium in his vitamin D supplementation product. He is going to stop that for a month so we can follow.  Dr. Brigitte Pulse is managing his diabetes.  His IVIG is now below 400 and it will be replaced next week. I am glad to see that his quads are little bit stronger and he is almost able to get up from a sitting position without pushing off. His squamous cell skin cancers are being dealt with aggressively by dermatology.  Kayleb has a good understanding of the overall plan. He agrees with it. He will call with any problems that may develop before his next visit here.    Chauncey Cruel, MD 07/27/2014

## 2014-07-27 NOTE — Telephone Encounter (Signed)
per pof to sch appt-*sent [facility] email to coordinate appts w/MD appts-will give pt updated copy of sch before leaving

## 2014-07-27 NOTE — Patient Instructions (Signed)
Hypomagnesemia Magnesium is a common ion (mineral) in the body which is needed for metabolism. It is about how the body handles food and other chemical reactions necessary for life. Only about 2% of the magnesium in our body is found in the blood. When this is low, it is called hypomagnesemia. The blood will measure only a tiny amount of the magnesium in our body. When it is low in our blood, it does not mean that the whole body supply is low. The normal serum concentration ranges from 1.8-2.5 mEq/L. When the level gets to be less than 1.0 mEq/L, a number of problems begin to happen.  CAUSES   Receiving intravenous fluids without magnesium replacement.  Loss of magnesium from the bowel by nasogastric suction.  Loss of magnesium from nausea and vomiting or severe diarrhea. Any of the inflammatory bowel conditions can cause this.  Abuse of alcohol often leads to low serum magnesium.  An inherited form of magnesium loss happens when the kidneys lose magnesium. This is called familial or primary hypomagnesemia.  Some medications such as diuretics also cause the loss of magnesium. SYMPTOMS  These following problems are worse if the changes in magnesium levels come on suddenly.  Tremor.  Confusion.  Muscle weakness.  Oversensitive to sights and sounds.  Sensitive reflexes.  Depression.  Muscular fibrillations.  Overreactivity of the nerves.  Irritability.  Psychosis.  Spasms of the hand muscles.  Tetany (where the muscles go into uncontrollable spasms). DIAGNOSIS  This condition can be diagnosed by blood tests. TREATMENT   In an emergency, magnesium can be given intravenously (by vein).  If the condition is less worrisome, it can be corrected by diet. High levels of magnesium are found in green leafy vegetables, peas, beans, and nuts among other things. It can also be given through medications by mouth.  If it is being caused by medications, changes can be made.  If  alcohol is a problem, help is available if there are difficulties giving it up. Document Released: 09/03/2005 Document Revised: 04/24/2014 Document Reviewed: 07/28/2008 [employer] Patient Information 2015 [employer]. This information is not intended to replace advice given to you by your health care provider. Make sure you discuss any questions you have with your health care provider.  

## 2014-08-03 ENCOUNTER — Other Ambulatory Visit: Payer: Self-pay

## 2014-08-03 ENCOUNTER — Other Ambulatory Visit (HOSPITAL_BASED_OUTPATIENT_CLINIC_OR_DEPARTMENT_OTHER): Payer: BC Managed Care – PPO

## 2014-08-03 ENCOUNTER — Ambulatory Visit: Payer: Self-pay

## 2014-08-03 ENCOUNTER — Ambulatory Visit (HOSPITAL_BASED_OUTPATIENT_CLINIC_OR_DEPARTMENT_OTHER): Payer: BC Managed Care – PPO

## 2014-08-03 DIAGNOSIS — D809 Immunodeficiency with predominantly antibody defects, unspecified: Secondary | ICD-10-CM

## 2014-08-03 DIAGNOSIS — C9110 Chronic lymphocytic leukemia of B-cell type not having achieved remission: Secondary | ICD-10-CM

## 2014-08-03 DIAGNOSIS — D839 Common variable immunodeficiency, unspecified: Secondary | ICD-10-CM

## 2014-08-03 LAB — CBC WITH DIFFERENTIAL/PLATELET
BASO%: 0.4 % (ref 0.0–2.0)
BASOS ABS: 0 10*3/uL (ref 0.0–0.1)
EOS ABS: 0 10*3/uL (ref 0.0–0.5)
EOS%: 0.6 % (ref 0.0–7.0)
HEMATOCRIT: 38.9 % (ref 38.4–49.9)
HEMOGLOBIN: 12.7 g/dL — AB (ref 13.0–17.1)
LYMPH%: 26.8 % (ref 14.0–49.0)
MCH: 30.3 pg (ref 27.2–33.4)
MCHC: 32.7 g/dL (ref 32.0–36.0)
MCV: 92.7 fL (ref 79.3–98.0)
MONO#: 0.5 10*3/uL (ref 0.1–0.9)
MONO%: 7 % (ref 0.0–14.0)
NEUT#: 4.3 10*3/uL (ref 1.5–6.5)
NEUT%: 65.2 % (ref 39.0–75.0)
PLATELETS: 151 10*3/uL (ref 140–400)
RBC: 4.19 10*6/uL — ABNORMAL LOW (ref 4.20–5.82)
RDW: 15.5 % — ABNORMAL HIGH (ref 11.0–14.6)
WBC: 6.6 10*3/uL (ref 4.0–10.3)
lymph#: 1.8 10*3/uL (ref 0.9–3.3)

## 2014-08-03 LAB — COMPREHENSIVE METABOLIC PANEL (CC13)
ALK PHOS: 69 U/L (ref 40–150)
ALT: 40 U/L (ref 0–55)
AST: 30 U/L (ref 5–34)
Albumin: 3.6 g/dL (ref 3.5–5.0)
Anion Gap: 8 mEq/L (ref 3–11)
BUN: 57.4 mg/dL — AB (ref 7.0–26.0)
CALCIUM: 9.1 mg/dL (ref 8.4–10.4)
CO2: 17 mEq/L — ABNORMAL LOW (ref 22–29)
CREATININE: 1.6 mg/dL — AB (ref 0.7–1.3)
Chloride: 113 mEq/L — ABNORMAL HIGH (ref 98–109)
Glucose: 177 mg/dl — ABNORMAL HIGH (ref 70–140)
Potassium: 5.1 mEq/L (ref 3.5–5.1)
Sodium: 138 mEq/L (ref 136–145)
Total Bilirubin: 0.23 mg/dL (ref 0.20–1.20)
Total Protein: 6.5 g/dL (ref 6.4–8.3)

## 2014-08-03 LAB — MAGNESIUM (CC13): Magnesium: 1.6 mg/dl (ref 1.5–2.5)

## 2014-08-03 MED ORDER — HEPARIN SOD (PORK) LOCK FLUSH 100 UNIT/ML IV SOLN
500.0000 [IU] | Freq: Once | INTRAVENOUS | Status: AC | PRN
  Administered 2014-08-03: 500 [IU]
  Filled 2014-08-03: qty 5

## 2014-08-03 MED ORDER — IMMUNE GLOBULIN (HUMAN) 5 GM/100ML IV SOLN: 1.0000 g/kg | Freq: Once | INTRAVENOUS | Status: DC

## 2014-08-03 MED ORDER — SODIUM CHLORIDE 0.9 % IV SOLN
1.0000 g | Freq: Once | INTRAVENOUS | Status: AC
  Administered 2014-08-03: 1 g via INTRAVENOUS
  Filled 2014-08-03: qty 2

## 2014-08-03 MED ORDER — SODIUM CHLORIDE 0.9 % IV SOLN
INTRAVENOUS | Status: DC
  Administered 2014-08-03: 10:00:00 via INTRAVENOUS

## 2014-08-03 MED ORDER — IMMUNE GLOBULIN (HUMAN) 20 GM/200ML IV SOLN
100.0000 g | Freq: Once | INTRAVENOUS | Status: AC
  Administered 2014-08-03: 100 g via INTRAVENOUS
  Filled 2014-08-03: qty 1000

## 2014-08-03 MED ORDER — IMMUNE GLOBULIN (HUMAN) 20 GM/200ML IV SOLN
100.0000 g | Freq: Once | INTRAVENOUS | Status: DC
  Filled 2014-08-03: qty 1000

## 2014-08-03 NOTE — Patient Instructions (Signed)
Immune Globulin Injection What is this medicine? IMMUNE GLOBULIN (im MUNE GLOB yoo lin) helps to prevent or reduce the severity of certain infections in patients who are at risk. This medicine is collected from the pooled blood of many donors. It is used to treat immune system problems, thrombocytopenia, and Kawasaki syndrome. This medicine may be used for other purposes; ask your health care provider or pharmacist if you have questions. COMMON BRAND NAME(S): Baygam, BIVIGAM, Carimune, Carimune NF, Flebogamma, Flebogamma DIF, GamaSTAN S/D, Gamimune N, Gammagard S/D, Gammaked, Gammaplex, Gammar-P IV, Gamunex, Gamunex-C, Hizentra, Iveegam, Iveegam EN, Octagam, Panglobulin, Panglobulin NF, Polygam S/D, Privigen, Sandoglobulin, Venoglobulin-S, Vigam, Vivaglobulin What should I tell my health care provider before I take this medicine? They need to know if you have any of these conditions: - diabetes - extremely low or no immune antibodies in the blood - heart disease - history of blood clots - hyperprolinemia - infection in the blood, sepsis - kidney disease - taking medicine that may change kidney function - ask your health care provider about your medicine - an unusual or allergic reaction to human immune globulin, albumin, maltose, sucrose, polysorbate 80, other medicines, foods, dyes, or preservatives - pregnant or trying to get pregnant - breast-feeding How should I use this medicine? This medicine is for injection into a muscle or infusion into a vein or skin. It is usually given by a health care professional in a hospital or clinic setting. In rare cases, some brands of this medicine might be given at home. You will be taught how to give this medicine. Use exactly as directed. Take your medicine at regular intervals. Do not take your medicine more often than directed. Talk to your pediatrician regarding the use of this medicine in children. Special care may be needed. Overdosage:  If you think you have taken too much of this medicine contact a poison control center or emergency room at once. NOTE: This medicine is only for you. Do not share this medicine with others. What if I miss a dose? It is important not to miss your dose. Call your doctor or health care professional if you are unable to keep an appointment. If you give yourself the medicine and you miss a dose, take it as soon as you can. If it is almost time for your next dose, take only that dose. Do not take double or extra doses. What may interact with this medicine? -aspirin and aspirin-like medicines -cisplatin -cyclosporine -medicines for infection like acyclovir, adefovir, amphotericin B, bacitracin, cidofovir, foscarnet, ganciclovir, gentamicin, pentamidine, vancomycin -NSAIDS, medicines for pain and inflammation, like ibuprofen or naproxen -pamidronate -vaccines -zoledronic acid This list may not describe all possible interactions. Give your health care provider a list of all the medicines, herbs, non-prescription drugs, or dietary supplements you use. Also tell them if you smoke, drink alcohol, or use illegal drugs. Some items may interact with your medicine. What should I watch for while using this medicine? Your condition will be monitored carefully while you are receiving this medicine. This medicine is made from pooled blood donations of many different people. It may be possible to pass an infection in this medicine. However, the donors are screened for infections and all products are tested for HIV and hepatitis. The medicine is treated to kill most or all bacteria and viruses. Talk to your doctor about the risks and benefits of this medicine. Do not have vaccinations for at least 14 days before, or until at least 3 months after receiving this   medicine. What side effects may I notice from receiving this medicine? Side effects that you should report to your doctor or health care professional as soon as  possible: -allergic reactions like skin rash, itching or hives, swelling of the face, lips, or tongue -breathing problems -chest pain or tightness -fever, chills -headache with nausea, vomiting -neck pain or difficulty moving neck -pain when moving eyes -pain, swelling, warmth in the leg -problems with balance, talking, walking -sudden weight gain -swelling of the ankles, feet, hands -trouble passing urine or change in the amount of urine Side effects that usually do not require medical attention (report to your doctor or health care professional if they continue or are bothersome): -dizzy, drowsy -flushing -increased sweating -leg cramps -muscle aches and pains -pain at site where injected This list may not describe all possible side effects. Call your doctor for medical advice about side effects. You may report side effects to FDA at 1-800-FDA-1088. Where should I keep my medicine? Keep out of the reach of children. This drug is usually given in a hospital or clinic and will not be stored at home. In rare cases, some brands of this medicine may be given at home. If you are using this medicine at home, you will be instructed on how to store this medicine. Throw away any unused medicine after the expiration date on the label. NOTE: This sheet is a summary. It may not cover all possible information. If you have questions about this medicine, talk to your doctor, pharmacist, or health care provider.  2015, [employer]. (2009-02-28 11:44:49)  Hypomagnesemia Magnesium is a common ion (mineral) in the body which is needed for metabolism. It is about how the body handles food and other chemical reactions necessary for life. Only about 2% of the magnesium in our body is found in the blood. When this is low, it is called hypomagnesemia. The blood will measure only a tiny amount of the magnesium in our body. When it is low in our blood, it does not mean that the whole body supply is  low. The normal serum concentration ranges from 1.8-2.5 mEq/L. When the level gets to be less than 1.0 mEq/L, a number of problems begin to happen.  CAUSES   Receiving intravenous fluids without magnesium replacement.  Loss of magnesium from the bowel by nasogastric suction.  Loss of magnesium from nausea and vomiting or severe diarrhea. Any of the inflammatory bowel conditions can cause this.  Abuse of alcohol often leads to low serum magnesium.  An inherited form of magnesium loss happens when the kidneys lose magnesium. This is called familial or primary hypomagnesemia.  Some medications such as diuretics also cause the loss of magnesium. SYMPTOMS  These following problems are worse if the changes in magnesium levels come on suddenly.  Tremor.  Confusion.  Muscle weakness.  Oversensitive to sights and sounds.  Sensitive reflexes.  Depression.  Muscular fibrillations.  Overreactivity of the nerves.  Irritability.  Psychosis.  Spasms of the hand muscles.  Tetany (where the muscles go into uncontrollable spasms). DIAGNOSIS  This condition can be diagnosed by blood tests. TREATMENT   In an emergency, magnesium can be given intravenously (by vein).  If the condition is less worrisome, it can be corrected by diet. High levels of magnesium are found in green leafy vegetables, peas, beans, and nuts among other things. It can also be given through medications by mouth.  If it is being caused by medications, changes can be made.  If  alcohol is a problem, help is available if there are difficulties giving it up. Document Released: 09/03/2005 Document Revised: 04/24/2014 Document Reviewed: 07/28/2008 [facility] Patient Information 2015 [place], [place]. This information is not intended to replace advice given to you by your health care provider. Make sure you discuss any questions you have with your health care provider.

## 2014-08-04 LAB — IGG, IGA, IGM
IgA: <7 mg/dL — ABNORMAL LOW (ref 68–379)
IgG (Immunoglobin G), Serum: 732 mg/dL (ref 650–1600)
IgM, Serum: <5 mg/dL — ABNORMAL LOW (ref 41–251)

## 2014-08-09 ENCOUNTER — Other Ambulatory Visit: Payer: Self-pay | Admitting: Oncology

## 2014-08-09 ENCOUNTER — Ambulatory Visit (HOSPITAL_COMMUNITY)
Admission: RE | Admit: 2014-08-09 | Discharge: 2014-08-09 | Disposition: A | Discharge status: Home / Self Care | Payer: BC Managed Care – PPO | Source: Ambulatory Visit | Attending: Oncology | Admitting: Oncology

## 2014-08-09 ENCOUNTER — Ambulatory Visit (HOSPITAL_COMMUNITY): Payer: BC Managed Care – PPO

## 2014-08-09 DIAGNOSIS — M47817 Spondylosis without myelopathy or radiculopathy, lumbosacral region: Secondary | ICD-10-CM | POA: Diagnosis not present

## 2014-08-09 DIAGNOSIS — C9110 Chronic lymphocytic leukemia of B-cell type not having achieved remission: Secondary | ICD-10-CM

## 2014-08-09 DIAGNOSIS — M545 Low back pain, unspecified: Secondary | ICD-10-CM | POA: Diagnosis present

## 2014-08-09 DIAGNOSIS — M8448XA Pathological fracture, other site, initial encounter for fracture: Secondary | ICD-10-CM | POA: Diagnosis not present

## 2014-08-09 MED ORDER — GADOBENATE DIMEGLUMINE 529 MG/ML IV SOLN
20.0000 mL | Freq: Once | INTRAVENOUS | Status: AC | PRN
  Administered 2014-08-09: 20 mL via INTRAVENOUS

## 2014-08-10 ENCOUNTER — Ambulatory Visit (HOSPITAL_BASED_OUTPATIENT_CLINIC_OR_DEPARTMENT_OTHER): Payer: BC Managed Care – PPO | Admitting: Nurse Practitioner

## 2014-08-10 ENCOUNTER — Other Ambulatory Visit (HOSPITAL_BASED_OUTPATIENT_CLINIC_OR_DEPARTMENT_OTHER): Payer: BC Managed Care – PPO

## 2014-08-10 ENCOUNTER — Encounter: Payer: Self-pay | Admitting: Nurse Practitioner

## 2014-08-10 ENCOUNTER — Ambulatory Visit (HOSPITAL_BASED_OUTPATIENT_CLINIC_OR_DEPARTMENT_OTHER): Payer: BC Managed Care – PPO

## 2014-08-10 ENCOUNTER — Ambulatory Visit: Payer: Self-pay

## 2014-08-10 ENCOUNTER — Other Ambulatory Visit: Payer: Self-pay

## 2014-08-10 VITALS — BP 135/78 | HR 70 | Temp 98.3°F | Resp 18 | Ht 66.0 in | Wt 210.9 lb

## 2014-08-10 DIAGNOSIS — C9110 Chronic lymphocytic leukemia of B-cell type not having achieved remission: Secondary | ICD-10-CM

## 2014-08-10 DIAGNOSIS — M549 Dorsalgia, unspecified: Secondary | ICD-10-CM

## 2014-08-10 DIAGNOSIS — G8929 Other chronic pain: Secondary | ICD-10-CM

## 2014-08-10 LAB — CBC WITH DIFFERENTIAL/PLATELET
BASO%: 0.4 % (ref 0.0–2.0)
BASOS ABS: 0 10*3/uL (ref 0.0–0.1)
EOS ABS: 0 10*3/uL (ref 0.0–0.5)
EOS%: 0.3 % (ref 0.0–7.0)
HCT: 40.3 % (ref 38.4–49.9)
HEMOGLOBIN: 13.1 g/dL (ref 13.0–17.1)
LYMPH#: 1.8 10*3/uL (ref 0.9–3.3)
LYMPH%: 35.8 % (ref 14.0–49.0)
MCH: 30.1 pg (ref 27.2–33.4)
MCHC: 32.6 g/dL (ref 32.0–36.0)
MCV: 92.3 fL (ref 79.3–98.0)
MONO#: 0.4 10*3/uL (ref 0.1–0.9)
MONO%: 7.4 % (ref 0.0–14.0)
NEUT%: 56.1 % (ref 39.0–75.0)
NEUTROS ABS: 2.9 10*3/uL (ref 1.5–6.5)
Platelets: 130 10*3/uL — ABNORMAL LOW (ref 140–400)
RBC: 4.36 10*6/uL (ref 4.20–5.82)
RDW: 15.7 % — AB (ref 11.0–14.6)
WBC: 5.1 10*3/uL (ref 4.0–10.3)

## 2014-08-10 LAB — COMPREHENSIVE METABOLIC PANEL (CC13)
ALK PHOS: 75 U/L (ref 40–150)
ALT: 43 U/L (ref 0–55)
AST: 35 U/L — ABNORMAL HIGH (ref 5–34)
Albumin: 3.6 g/dL (ref 3.5–5.0)
Anion Gap: 8 mEq/L (ref 3–11)
BUN: 47.8 mg/dL — AB (ref 7.0–26.0)
CALCIUM: 9.4 mg/dL (ref 8.4–10.4)
CO2: 16 meq/L — AB (ref 22–29)
Chloride: 114 mEq/L — ABNORMAL HIGH (ref 98–109)
Creatinine: 1.5 mg/dL — ABNORMAL HIGH (ref 0.7–1.3)
Glucose: 199 mg/dl — ABNORMAL HIGH (ref 70–140)
POTASSIUM: 5.3 meq/L — AB (ref 3.5–5.1)
SODIUM: 138 meq/L (ref 136–145)
TOTAL PROTEIN: 7.2 g/dL (ref 6.4–8.3)
Total Bilirubin: 0.25 mg/dL (ref 0.20–1.20)

## 2014-08-10 LAB — MAGNESIUM (CC13): Magnesium: 1.6 mg/dl (ref 1.5–2.5)

## 2014-08-10 MED ORDER — HEPARIN SOD (PORK) LOCK FLUSH 100 UNIT/ML IV SOLN
500.0000 [IU] | Freq: Once | INTRAVENOUS | Status: AC | PRN
  Administered 2014-08-10: 500 [IU]
  Filled 2014-08-10: qty 5

## 2014-08-10 MED ORDER — HYDROCODONE-ACETAMINOPHEN 7.5-325 MG PO TABS: 1.0000 | ORAL_TABLET | ORAL | Status: DC | PRN

## 2014-08-10 MED ORDER — SODIUM CHLORIDE 0.9 % IV SOLN
1.0000 g | Freq: Once | INTRAVENOUS | Status: AC
  Administered 2014-08-10: 1 g via INTRAVENOUS
  Filled 2014-08-10: qty 2

## 2014-08-10 MED ORDER — SODIUM CHLORIDE 0.9 % IJ SOLN
10.0000 mL | INTRAMUSCULAR | Status: DC | PRN
  Administered 2014-08-10: 10 mL via INTRAVENOUS
  Filled 2014-08-10: qty 10

## 2014-08-10 NOTE — Progress Notes (Signed)
ID: Timothy [name]   DOB: 01/21/1946  MR#: [id number]  RCB#:[id number]  OEH:OZYY, Timothy Saxon, MD SU: OTHER MD: Timothy [name]; Timothy Shin, MD; Timothy [name], Timothy Hatcher,MD; Timothy Rosenthal, MD; [facility] in [place], [place]:  Timothy Duval, RN [phone])  CHIEF COMPLAINT:  CLL, status post allogeneic stem cell transplant, GVHD CURRENT TREATMENT: Immunosuppression  HISTORY OF CLL: From the original intake note:  We have very complete records from Dr. Racheal [name] in [place], and in summary:  The patient was initially diagnosed in August 2000, with a white cell count of 23,600, but normal hemoglobin and platelets, and no significant symptomatology. Over the next several years his white cell count drifted up, and he eventually developed some symptoms of night sweats in particular, leading to treatment with fludarabine, Cytoxan and rituxan for five cycles given between December 2006 and May 2007.  We have CT scans from June 2006, November 2006 and April 2007, and comparing the November 2006 and April 2007 scans, there was near complete response. He had subsequent therapy in [place] as detailed below, but with decreased response, leading to allogeneic stem-cell transplant at the [facility] 02/24/2012.  Subsequent history is as detailed below.  INTERVAL HISTORY: Timothy [name] returns today with his wife, Timothy [name], for follow up of his chronic lymphoid leukemia status post allogenic transplant. He has remained on 1 mg of tacrolimus BID and has tolerated this well. He has about 4 bowel movements a day and maybe only 1 of those is loose. Last week he had another IVIG infusion. He denies fevers, chills, nausea, vomiting, mouth sores or any new rashes. Timothy [name] complained of intense back pain on his last visit and had a thoracolumbar MRI. No new compression fractures were noted. His pain is better, though he doesn't think the lidoderm [name] prescribed last  visit actually help much. He is sta  REVIEW OF SYSTEMS: Timothy [name]'s quads continue to strengthen. He is able to rise from the chair without assistance using his hands. he right temporal lesions biopsied by dermatology have now completely healed. No scabs or bandages present. A detailed review of symptoms was negative except as noted above.   PAST MEDICAL HISTORY: Past Medical History  Diagnosis Date  . 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 Diverticular disease   . Hyperlipidemia   . Obesity   . Hypertension   . Hiatal hernia   . CMV (cytomegalovirus) antibody positive     pre-transplant, with seroconversion x2 pst-transplant  . Right bundle branch block     pre-transplant  . CKD (chronic kidney disease) stage 2, GFR 60-89 ml/min   . Pancytopenia   . Steroid-induced diabetes   . Atrial fibrillation     post-transplant  . Myopathy   . 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 Leukemia, chronic lymphoid   . 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 Diarrhea in adult patient 12/05/2012    Due to active GVHD  . CLL (chronic lymphocytic leukemia) 12/05/2012    Dx 07/1999; started Rx 12/06  AlloBMT 3/13  . Rash of face 12/05/2012    Due to GVHD  . Hypomagnesemia 01/26/2013  . Left hip pain 12/01/2013    PAST SURGICAL HISTORY: Past Surgical History  Procedure Laterality Date  . Tonsillectomy and adenoidectomy    . Bone marrow transplant    . Flexible sigmoidoscopy  11/17/2012    Procedure: FLEXIBLE SIGMOIDOSCOPY;  Surgeon: Timothy Columbia, MD;  Location: [facility];  Service: Endoscopy;  Laterality: N/A;  Dr Watt Climes states will be admitted to rooom 1339 11/16/12  . Esophagogastroduodenoscopy  11/17/2012    Procedure: ESOPHAGOGASTRODUODENOSCOPY (EGD);  Surgeon: Timothy Columbia, MD;  Location: [facility];  Service: Endoscopy;  Laterality: N/A;    FAMILY HISTORY Family History   Problem Relation Age of Onset  . Cancer Father   The patient's father died from complications of chronic lymphocytic leukemia at the age of 73.  It had been diagnosed seven years before when he was 64.  The patient is enrolled in a familial chronic lymphocytic leukemia study out of the [employer].  The patient's mother is 41, alive, unfortunately suffering with dementia, and he has a brother, 63, who is otherwise in fair health.   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  His wife of >40 years, Timothy [name], is a homemaker.  Their daughter, Timothy [name], lives in [place].  She also is a Agricultural engineer.  The patient has an 2 year old grandson and an 26-year-old granddaughter, and that is really the main reason he moved to this area.  He is a Tourist information centre manager.     ADVANCED DIRECTIVES: In place  HEALTH MAINTENANCE: (Updated 04/13/2014) History  Substance Use Topics  . Smoking status: Never Smoker   . Smokeless tobacco: Never Used  . Alcohol Use: No     Colonoscopy: Nov 2013, Dr. Watt Climes  PSA: Not on file  Bone density:  Feb 2014;  Patient also has known insufficiency and pathologic fractures in addition to his long-standing history of steroid use.  Lipid panel: Jan 2015, elevated    Allergies  Allergen Reactions  . Benadryl [Diphenhydramine Hcl]     "Restless leg syndrome"    Current Outpatient Prescriptions  Medication Sig Dispense Refill  . acyclovir (ZOVIRAX) 400 MG tablet Take 2 tablets (800 mg total) by mouth 2 (two) times daily.  120 tablet  3  . amoxicillin-clavulanate (AUGMENTIN) 875-125 MG per tablet Take 1 tablet by mouth 4 (four) times daily as needed.  8 tablet  0  . budesonide (ENTOCORT EC) 3 MG 24 hr capsule Take 1 capsule (3 mg total) by mouth 3 (three)  times daily.  90 capsule  6  . cholecalciferol (VITAMIN D) 1000 UNITS tablet Take 1,000 Units by mouth every evening.       . cholestyramine (QUESTRAN) 4 G packet Take 1 packet (4 g total) by mouth daily.  30 each  12  . diltiazem (CARDIZEM CD) 240 MG 24 hr capsule       . diltiazem (CARDIZEM CD) 240 MG 24 hr capsule TAKE 1 CAPSULE BY MOUTH DAILY  30 capsule  3  . diltiazem (DILACOR XR) 240 MG 24 hr capsule Take 1 capsule (240 mg total) by mouth every evening.  30 capsule  5  . fluconazole (DIFLUCAN) 100 MG tablet Take 1 tablet (100 mg total) by mouth daily.  30 tablet  6  . furosemide (LASIX) 20 MG tablet Take 1 tablet (20 mg total) by mouth daily.  30 tablet  5  . glucose blood (ONE TOUCH ULTRA TEST) test strip Test before meals and at bedtime.  300 each  0  . Insulin Aspart Prot & Aspart (NOVOLOG 70/30 MIX) (70-30) 100 UNIT/ML  Pen Inject 10 Units into the skin 2 (two) times daily. 10 units with breakfast, 10 units with dinner      . Insulin Pen Needle (B-D UF III MINI PEN NEEDLES) 31G X 5 MM MISC Use twice daily with insulin as directed  100 each  5  . labetalol (NORMODYNE) 200 MG tablet Take 400 mg by mouth 2 (two) times daily.       Marland Kitchen lidocaine (LIDODERM) 5 % Place 1 patch onto the skin daily. Remove & Discard patch within 12 hours or as directed by MD  30 patch  0  . Lidocaine-Hydrocortisone Ace 3-0.5 % KIT Apply 1 application topically as needed (for pain).  1 each  3  . lidocaine-prilocaine (EMLA) cream Apply 1 application topically as needed. 1-2 hrs before each port access  30 g  6  . lisinopril (PRINIVIL,ZESTRIL) 10 MG tablet Take 10 mg by mouth every evening.       . loratadine (CLARITIN) 10 MG tablet Take 10 mg by mouth daily.      . Multiple Vitamin (MULTIVITAMIN WITH MINERALS) TABS tablet Take 1 tablet by mouth daily.      Marland Kitchen omeprazole (PRILOSEC) 20 MG capsule Take 1 capsule (20 mg total) by mouth daily.  30 capsule  5  . ONETOUCH DELICA LANCETS 88C MISC Test before meals and at  bedtime.  300 each  0  . predniSONE (DELTASONE) 5 MG tablet Take 0.5-1 tablets (2.5-5 mg total) by mouth 2 (two) times daily. Takes 42m in the morning and 2.566min the evening.  45 tablet  2  . sertraline (ZOLOFT) 50 MG tablet ALTERNATE TAKING 1 TABLET DAILY AND 2 TABLETS DAILY  90 tablet  5  . sulfamethoxazole-trimethoprim (BACTRIM DS) 800-160 MG per tablet Take 1 tablet by mouth daily.  30 tablet  5  . tacrolimus (PROGRAF) 0.5 MG capsule TAKE 2 capsules in the morning and 2 in the evening      . vancomycin (VANCOCIN) 125 MG capsule       . VOLTAREN 1 % GEL Apply 2 g topically 2 (two) times daily. Applied to back      . HYDROcodone-acetaminophen (NORCO) 7.5-325 MG per tablet Take 1 tablet by mouth every 4 (four) hours as needed for moderate pain.  120 tablet  0  . [DISCONTINUED] insulin aspart (NOVOLOG FLEXPEN) 100 UNIT/ML SOPN FlexPen 18units sq qam, 9units sq qpm, or as directed  15 mL  1   No current facility-administered medications for this visit.   Facility-Administered Medications Ordered in Other Visits  Medication Dose Route Frequency Provider Last Rate Last Dose  . 0.9 %  sodium chloride infusion   Intravenous Continuous Amy G Berry, PA-C 500 mL/hr at 03/12/13 0900    . heparin lock flush 100 unit/mL  500 Units Intracatheter Once PRN GuChauncey CruelMD      . sodium chloride 0.9 % injection 10 mL  10 mL Intravenous PRN GuChauncey CruelMD   10 mL at 08/11/12 1606  . sodium chloride 0.9 % injection 10 mL  10 mL Intravenous PRN GuChauncey CruelMD        OBJECTIVE: Middle-aged white man in no acute distress Filed Vitals:   08/10/14 1053  BP: 135/78  Pulse: 70  Temp: 98.3 F (36.8 C)  Resp: 18  Body mass index is 34.06 kg/(m^2).  ECOG: 1 Filed Weights   08/10/14 1053  Weight: 210 lb 14.4 oz (95.664 kg)   Skin: warm, dry,  ecchymotic on hands and forearms, stable. Atrophic skin. HEENT: sclerae anicteric, conjunctivae pink, oropharynx clear. No thrush or mucositis.   Lymph Nodes: No cervical or supraclavicular lymphadenopathy  Lungs: clear to auscultation bilaterally, no rales, wheezes, or rhonci  Heart: regular rate and rhythm  Abdomen: round, soft, non tender, positive bowel sounds  Musculoskeletal: No focal spinal tenderness, no peripheral edema  Neuro: non focal, well oriented, positive affect   LABS:  CBC    Component Value Date/Time   WBC 5.1 08/10/2014 1029   WBC 7.1 04/06/2014 0949   RBC 4.36 08/10/2014 1029   RBC 4.06* 04/06/2014 0949   RBC 3.82* 03/16/2013 1400   HGB 13.1 08/10/2014 1029   HGB 12.2* 04/06/2014 0949   HCT 40.3 08/10/2014 1029   HCT 38.5* 04/06/2014 0949   PLT 130* 08/10/2014 1029   PLT 144* 04/06/2014 0949   MCV 92.3 08/10/2014 1029   MCV 94.8 04/06/2014 0949   MCH 30.1 08/10/2014 1029   MCH 30.0 04/06/2014 0949   MCHC 32.6 08/10/2014 1029   MCHC 31.7 04/06/2014 0949   RDW 15.7* 08/10/2014 1029   RDW 15.7* 04/06/2014 0949   LYMPHSABS 1.8 08/10/2014 1029   LYMPHSABS 1.4 03/18/2013 0615   MONOABS 0.4 08/10/2014 1029   MONOABS 0.3 03/18/2013 0615   EOSABS 0.0 08/10/2014 1029   EOSABS 0.0 03/18/2013 0615   BASOSABS 0.0 08/10/2014 1029   BASOSABS 0.0 03/18/2013 0615       Chemistry      Component Value Date/Time   NA 138 08/10/2014 1029   NA 131* 06/14/2013 0800   K 5.3* 08/10/2014 1029   K 4.7 06/14/2013 0800   CL 101 06/15/2013 1034   CL 97 06/14/2013 0800   CO2 16* 08/10/2014 1029   CO2 24 06/14/2013 0800   BUN 47.8* 08/10/2014 1029   BUN 38* 06/14/2013 0800   CREATININE 1.5* 08/10/2014 1029   CREATININE 0.97 06/14/2013 0800      Component Value Date/Time   CALCIUM 9.4 08/10/2014 1029   CALCIUM 9.1 06/14/2013 0800   ALKPHOS 75 08/10/2014 1029   ALKPHOS 75 04/18/2013 1218   AST 35* 08/10/2014 1029   AST 24 04/18/2013 1218   ALT 43 08/10/2014 1029   ALT 33 04/18/2013 1218   BILITOT 0.25 08/10/2014 1029   BILITOT 0.2* 04/18/2013 1218     STUDIES: Mr Thoracic Spine W Wo Contrast  08/09/2014   CLINICAL DATA:  Mid to low back pain for  3 weeks. History of chronic lymphocytic leukemia.  EXAM: MRI THORACIC AND LUMBAR SPINE WITHOUT AND WITH CONTRAST  TECHNIQUE: Multiplanar and multiecho pulse sequences of the thoracic and lumbar spine were obtained without and with intravenous contrast.  CONTRAST:  5m MULTIHANCE GADOBENATE DIMEGLUMINE 529 MG/ML IV SOLN  COMPARISON:  CT chest, abdomen, and pelvis 02/21/2014  FINDINGS: MR THORACIC SPINE FINDINGS  Vertebral alignment is normal. Numerous Schmorl's nodes are again seen throughout the thoracic spine. Mild T12 compression fracture demonstrates unchanged height loss compared to 08/17/2013 with minimal marrow edema and enhancement subjacent to the superior endplate left of midline. Minimal anterior wedging of multiple other mid to lower thoracic vertebral bodies is unchanged. No acute compression fracture is identified. Vertebral bone marrow signal is mildly heterogeneous diffusely with scattered areas of fatty degenerative marrow changes, most prominently T6-7. Small central disc protrusions are present at T5-6 and T7-8 without spinal stenosis. There is a moderate central disc protrusion at T8-9 which contacts and mildly deforms the spinal cord without spinal canal  stenosis. Small to moderate central disc protrusion is also present at T12-L1 without cord contact or deformation. The thoracic spinal cord is normal in signal. Paraspinal soft tissues are unremarkable.  MR LUMBAR SPINE FINDINGS  Vertebral alignment is normal. Mild L3 and L4 compression fractures are again seen status post cement augmentation without evidence of significant progressive height loss. Mild L5 compression fracture is similar to prior CT and is without marrow edema. Minimal L2 superior endplate depression is also unchanged without marrow edema. Multilevel degenerative marrow changes are present. Multiple Schmorl's nodes are again seen. Conus medullaris is normal in signal and terminates at L2. Heterogeneous marrow signal is partially  visualized in the right sacral ala corresponding to heterogeneous sclerosis and insufficiency fracture described on prior CT.  L1-2:  Negative.  L2-3: Circumferential disc bulging, minimal posterior displacement of the L3 superior endplate, and mild facet and ligamentum flavum hypertrophy result in mild spinal stenosis without neural foraminal stenosis.  L3-4: Mild disc bulging and mild facet and ligamentum flavum hypertrophy without spinal canal or neural foraminal stenosis.  L4-5: Mild-to-moderate circumferential disc bulging and moderate facet and ligamentum flavum hypertrophy result in mild to moderate spinal stenosis and mild right greater than left neural foraminal stenosis.  L5-S1: Mild disc bulge and moderate facet and ligamentum flavum hypertrophy result in mild spinal stenosis without significant neural foraminal stenosis.  IMPRESSION: 1. Multiple mild compression fractures in the thoracic and lumbar spine without significant interval change in degree of height loss compared to prior CTs. Mild residual marrow edema is noted at T12. 2. Multiple small thoracic disc protrusions, most notable at T8-9 where the disc contacts and mildly deforms the spinal cord without stenosis. 3. Moderate lumbar spondylosis, greatest at L4-5 where there is mild-to-moderate spinal stenosis.   Electronically Signed   By: Logan Bores   On: 08/09/2014 10:34   ASSESSMENT: 68 y.o. [place] man with a history of well-differentiated lymphocytic lymphoma/ chronic lymphoid leukemia initially diagnosed in 2000, not requiring intervention until 2006; with multiple chromosomal abnormalities.  His treatment history is as follows:  (1) fludarabine/cyclophosphamide/rituximab x5 completed May 2007.   (2) rituximab for 8 doses October 2010, with partial response   (3) Leustatin and ofatumumab weekly x8 July to September 2011 followed by maintenance ofatumumab  every 2 months, with initial response but rising counts September 2012    (4) status-post unrelated donor stem-cell transplant 02/24/2012 at [facility]  (a) conditioning regimen consisted of fludarabine + TBI at 200 cGy, followed by rituximab x27;  (b) CMV reactivation x3 (patient CMV positive, donor negative), s/p ganciclovir treatment; 3d reactivation August 2013, s/p gancyclovir, with negative PCR mid-September 2013; last gancyclovir dose 10/06/2012 (c) Chronic GVHD: involving gut and skin, treated with steroids, tacrolimus and MMF.  MMF was eventually d/c'd and tacrolimus currently at a dose of 1.64m BID (d) atrial fibrillation: resolved on brief amiodarone regimen (e) steroid-induced myopathy: improving  (f) hypomagnesemia: improved after d/c gancyclovir, needs continuing support (g) hypogammaglobulinemia: requiring IVIG most recently 08/03/2014. (h) history of elevated triglycerides (606 on 07/14/2012)  (i) adrenal insufficiency: on prednisone and budesonide (j) pancytopenia,resolved (k) [id number] February 2015, accompanied by diarrhea; resolved   (5) restaging studies February-March 2015 including CT scans, flow cytometry, and bone marrow biopsy, showed no evidence of residual chronic lymphoid leukemia.  (6) recurrent GVHD (skin rash, mouth changes, severe diarrhea and gastric/duodenal/colonic biopsies 11/17/2012 c/w GVHD grade 2) : now grade 1 to inactive  (7)  malnutrition -- on VITAL  supplement in addition to regular diet; on Marinol for anorexia  (8) testosterone deficiency--on patch   (9) deconditioning: Especially quad weakness; continuing rehabilitation exercises  (10) mild dehydration: encouraged increased po fluids; receives IVF support w magnesium weekly  (11) severe steroid-induced osteoporosis with compression fractures: received pamidronate 12/18/2012. Status post kyphoplasty at L3-4 in June 2014. Also with evidence of rib fractures and insufficiency fractures bilaterally of the sacral  alae, noted by CT in  March 2015. --   Denosumab started 12/08/2013, given as prolia Q6 months which is what has been approved by his insurance, most recent dose 03/30/2014  (12) chronic back pain and hip pain controlled with OxyContin and hydrocodone/APAP.  (12) nausea: well controlled on current meds  (13)  Positive c.diff, 03/08/2013, on Flagyl 500 mg TID x 20 days, then on oral vanco with Questran, showing improvement; positive when repeated April 2014; Negative x 3   (14) persistently increased BUN and potassium  (15)  Hypertension, on labetalol, cardizem, lisinopril, and furosemide; managed by Dr. Brigitte Pulse  (16) steroid induced hyperglycemia, on sterlix and 70/30 insulin, followed by Dr. Loanne Drilling and Dr. Brigitte Pulse  (17) hypogammaglobulinemia-- requiring intermittent supplementation Results for NOE, GOYER (MRN [id number]) as of 07/27/2014 13:21  Ref. Range 05/04/2014 14:03 05/25/2014 11:17 06/01/2014 13:07 06/22/2014 12:01 07/13/2014 10:39  IgG (Immunoglobin G), Serum Latest Range: [phone] mg/dL 355 (L) [phone] (L) 339 (L)   (18) squamous cell CA in situ removed from left parietal scalp October 2014, second lesion to be removed may 2015   PLAN: Nihal's GVHD is under control. I am making no changes to his tacrolimus or steroid doses. The labs were reviewed in detail with him. He stopped taking the multivitamin, so his K+ is improving. He will have a magnesium infusion today after this office visit. His back pain has improved. I wrote him a prescription for norco in tablet form. The previous prescription was mistakenly written as a liquid. He will continue the prolia every 6 months, next due this October.  Burlie has an appointment next week to have some cavities filled and work done to his bridge. The week following he will visit with his dermatologist to have more squamous cell  lesions removed from the top of his head. Prophylatctic antibiotics have already been prescribed. Marquan will return for weekly labs labs and an  office visit in early Sept. He understands and agrees with this plan. He has been encouraged to call with any issues that might arise before his next visit here.   [name], NP 08/10/2014

## 2014-08-10 NOTE — Patient Instructions (Signed)
Hypomagnesemia Magnesium is a common ion (mineral) in the body which is needed for metabolism. It is about how the body handles food and other chemical reactions necessary for life. Only about 2% of the magnesium in our body is found in the blood. When this is low, it is called hypomagnesemia. The blood will measure only a tiny amount of the magnesium in our body. When it is low in our blood, it does not mean that the whole body supply is low. The normal serum concentration ranges from 1.8-2.5 mEq/L. When the level gets to be less than 1.0 mEq/L, a number of problems begin to happen.  CAUSES   Receiving intravenous fluids without magnesium replacement.  Loss of magnesium from the bowel by nasogastric suction.  Loss of magnesium from nausea and vomiting or severe diarrhea. Any of the inflammatory bowel conditions can cause this.  Abuse of alcohol often leads to low serum magnesium.  An inherited form of magnesium loss happens when the kidneys lose magnesium. This is called familial or primary hypomagnesemia.  Some medications such as diuretics also cause the loss of magnesium. SYMPTOMS  These following problems are worse if the changes in magnesium levels come on suddenly.  Tremor.  Confusion.  Muscle weakness.  Oversensitive to sights and sounds.  Sensitive reflexes.  Depression.  Muscular fibrillations.  Overreactivity of the nerves.  Irritability.  Psychosis.  Spasms of the hand muscles.  Tetany (where the muscles go into uncontrollable spasms). DIAGNOSIS  This condition can be diagnosed by blood tests. TREATMENT   In an emergency, magnesium can be given intravenously (by vein).  If the condition is less worrisome, it can be corrected by diet. High levels of magnesium are found in green leafy vegetables, peas, beans, and nuts among other things. It can also be given through medications by mouth.  If it is being caused by medications, changes can be made.  If  alcohol is a problem, help is available if there are difficulties giving it up. Document Released: 09/03/2005 Document Revised: 04/24/2014 Document Reviewed: 07/28/2008 [employer] Patient Information 2015 [employer]. This information is not intended to replace advice given to you by your health care provider. Make sure you discuss any questions you have with your health care provider.  

## 2014-08-17 ENCOUNTER — Other Ambulatory Visit (HOSPITAL_BASED_OUTPATIENT_CLINIC_OR_DEPARTMENT_OTHER): Payer: BC Managed Care – PPO

## 2014-08-17 ENCOUNTER — Ambulatory Visit (HOSPITAL_BASED_OUTPATIENT_CLINIC_OR_DEPARTMENT_OTHER): Payer: Medicare Other

## 2014-08-17 DIAGNOSIS — C9110 Chronic lymphocytic leukemia of B-cell type not having achieved remission: Secondary | ICD-10-CM

## 2014-08-17 LAB — CBC WITH DIFFERENTIAL/PLATELET
BASO%: 0.4 % (ref 0.0–2.0)
Basophils Absolute: 0 10*3/uL (ref 0.0–0.1)
EOS%: 0.3 % (ref 0.0–7.0)
Eosinophils Absolute: 0 10*3/uL (ref 0.0–0.5)
HEMATOCRIT: 38.8 % (ref 38.4–49.9)
HGB: 12.7 g/dL — ABNORMAL LOW (ref 13.0–17.1)
LYMPH%: 32.6 % (ref 14.0–49.0)
MCH: 30.1 pg (ref 27.2–33.4)
MCHC: 32.6 g/dL (ref 32.0–36.0)
MCV: 92.2 fL (ref 79.3–98.0)
MONO#: 0.5 10*3/uL (ref 0.1–0.9)
MONO%: 7.8 % (ref 0.0–14.0)
NEUT#: 3.6 10*3/uL (ref 1.5–6.5)
NEUT%: 58.9 % (ref 39.0–75.0)
Platelets: 115 10*3/uL — ABNORMAL LOW (ref 140–400)
RBC: 4.21 10*6/uL (ref 4.20–5.82)
RDW: 15.9 % — ABNORMAL HIGH (ref 11.0–14.6)
WBC: 6.1 10*3/uL (ref 4.0–10.3)
lymph#: 2 10*3/uL (ref 0.9–3.3)

## 2014-08-17 LAB — COMPREHENSIVE METABOLIC PANEL (CC13)
ALT: 33 U/L (ref 0–55)
AST: 28 U/L (ref 5–34)
Albumin: 3.6 g/dL (ref 3.5–5.0)
Alkaline Phosphatase: 71 U/L (ref 40–150)
Anion Gap: 6 mEq/L (ref 3–11)
BILIRUBIN TOTAL: 0.34 mg/dL (ref 0.20–1.20)
BUN: 51.2 mg/dL — ABNORMAL HIGH (ref 7.0–26.0)
CO2: 17 mEq/L — ABNORMAL LOW (ref 22–29)
CREATININE: 1.5 mg/dL — AB (ref 0.7–1.3)
Calcium: 9.5 mg/dL (ref 8.4–10.4)
Chloride: 115 mEq/L — ABNORMAL HIGH (ref 98–109)
Glucose: 155 mg/dl — ABNORMAL HIGH (ref 70–140)
Potassium: 5.8 mEq/L — ABNORMAL HIGH (ref 3.5–5.1)
SODIUM: 138 meq/L (ref 136–145)
TOTAL PROTEIN: 7 g/dL (ref 6.4–8.3)

## 2014-08-17 LAB — MAGNESIUM (CC13): Magnesium: 1.7 mg/dl (ref 1.5–2.5)

## 2014-08-17 MED ORDER — SODIUM CHLORIDE 0.9 % IJ SOLN
10.0000 mL | Freq: Once | INTRAMUSCULAR | Status: AC
  Administered 2014-08-17: 10 mL via INTRAVENOUS
  Filled 2014-08-17: qty 10

## 2014-08-17 MED ORDER — SODIUM CHLORIDE 0.9 % IV SOLN
Freq: Once | INTRAVENOUS | Status: AC
  Administered 2014-08-17: 12:00:00 via INTRAVENOUS

## 2014-08-17 MED ORDER — SODIUM CHLORIDE 0.9 % IV SOLN
1.0000 g | Freq: Once | INTRAVENOUS | Status: AC
  Administered 2014-08-17: 1 g via INTRAVENOUS
  Filled 2014-08-17: qty 2

## 2014-08-17 MED ORDER — HEPARIN SOD (PORK) LOCK FLUSH 100 UNIT/ML IV SOLN
500.0000 [IU] | Freq: Once | INTRAVENOUS | Status: AC | PRN
  Administered 2014-08-17: 500 [IU]
  Filled 2014-08-17: qty 5

## 2014-08-17 NOTE — Patient Instructions (Signed)
Magnesium Sulfate injection °What is this medicine? °MAGNESIUM SULFATE (mag NEE zee um SUL fate) is an electrolyte injection commonly used to treat low magnesium levels in your blood and to prevent or control certain seizures. °This medicine may be used for other purposes; ask your health care provider or pharmacist if you have questions. °What should I tell my health care provider before I take this medicine? °They need to know if you have any of these conditions: °-heart disease °-history of irregular heart beat °-kidney disease °-an unusual or allergic reaction to magnesium sulfate, medicines, foods, dyes, or preservatives °-pregnant or trying to get pregnant °-breast-feeding °How should I use this medicine? °This medicine is for infusion into a vein. It is given by a health care professional in a hospital or clinic setting. °Talk to your pediatrician regarding the use of this medicine in children. While this drug may be prescribed for selected conditions, precautions do apply. °Overdosage: If you think you have taken too much of this medicine contact a poison control center or emergency room at once. °NOTE: This medicine is only for you. Do not share this medicine with others. °What if I miss a dose? °This does not apply. °What may interact with this medicine? °This medicine may interact with the following medications: °-certain medicines for anxiety or sleep °-certain medicines for seizures like phenobarbital °-digoxin °-medicines that relax muscles for surgery °-narcotic medicines for pain °This list may not describe all possible interactions. Give your health care provider a list of all the medicines, herbs, non-prescription drugs, or dietary supplements you use. Also tell them if you smoke, drink alcohol, or use illegal drugs. Some items may interact with your medicine. °What should I watch for while using this medicine? °Your condition will be monitored carefully while you are receiving this medicine. You  may need blood work done while you are receiving this medicine. °What side effects may I notice from receiving this medicine? °Side effects that you should report to your doctor or health care professional as soon as possible: °-allergic reactions like skin rash, itching or hives, swelling of the face, lips, or tongue °-facial flushing °-muscle weakness °-signs and symptoms of low blood pressure like dizziness; feeling faint or lightheaded, falls; unusually weak or tired °-signs and symptoms of a dangerous change in heartbeat or heart rhythm like chest pain; dizziness; fast or irregular heartbeat; palpitations; breathing problems °-sweating °This list may not describe all possible side effects. Call your doctor for medical advice about side effects. You may report side effects to FDA at 1-800-FDA-1088. °Where should I keep my medicine? °This drug is given in a hospital or clinic and will not be stored at home. °NOTE: This sheet is a summary. It may not cover all possible information. If you have questions about this medicine, talk to your doctor, pharmacist, or health care provider. °© 2015, Elsevier/Gold Standard. (2013-04-18 10:35:11) ° °

## 2014-08-21 ENCOUNTER — Telehealth: Payer: Self-pay | Admitting: Oncology

## 2014-08-21 NOTE — Telephone Encounter (Signed)
per GM to move qppt to HF./ cld & spoke to pt to adv of new time for 9/3-adv would be seeing HF-pt understood

## 2014-08-24 ENCOUNTER — Telehealth: Payer: Self-pay | Admitting: *Deleted

## 2014-08-24 ENCOUNTER — Ambulatory Visit (HOSPITAL_BASED_OUTPATIENT_CLINIC_OR_DEPARTMENT_OTHER): Payer: BC Managed Care – PPO | Admitting: Nurse Practitioner

## 2014-08-24 ENCOUNTER — Encounter: Payer: Self-pay | Admitting: Nurse Practitioner

## 2014-08-24 ENCOUNTER — Telehealth: Payer: Self-pay | Admitting: Nurse Practitioner

## 2014-08-24 ENCOUNTER — Other Ambulatory Visit (HOSPITAL_BASED_OUTPATIENT_CLINIC_OR_DEPARTMENT_OTHER): Payer: BC Managed Care – PPO

## 2014-08-24 ENCOUNTER — Ambulatory Visit: Payer: Self-pay

## 2014-08-24 ENCOUNTER — Other Ambulatory Visit: Payer: Self-pay

## 2014-08-24 ENCOUNTER — Other Ambulatory Visit: Payer: Self-pay | Admitting: *Deleted

## 2014-08-24 ENCOUNTER — Ambulatory Visit (HOSPITAL_BASED_OUTPATIENT_CLINIC_OR_DEPARTMENT_OTHER): Payer: BC Managed Care – PPO

## 2014-08-24 ENCOUNTER — Ambulatory Visit: Payer: Self-pay | Admitting: Oncology

## 2014-08-24 VITALS — BP 129/73 | HR 70 | Temp 98.4°F | Resp 18 | Ht 66.0 in | Wt 211.4 lb

## 2014-08-24 DIAGNOSIS — D89811 Chronic graft-versus-host disease: Secondary | ICD-10-CM

## 2014-08-24 DIAGNOSIS — C9110 Chronic lymphocytic leukemia of B-cell type not having achieved remission: Secondary | ICD-10-CM

## 2014-08-24 DIAGNOSIS — T8609 Other complications of bone marrow transplant: Secondary | ICD-10-CM

## 2014-08-24 DIAGNOSIS — R7989 Other specified abnormal findings of blood chemistry: Secondary | ICD-10-CM | POA: Insufficient documentation

## 2014-08-24 DIAGNOSIS — D809 Immunodeficiency with predominantly antibody defects, unspecified: Secondary | ICD-10-CM

## 2014-08-24 DIAGNOSIS — Z23 Encounter for immunization: Secondary | ICD-10-CM

## 2014-08-24 DIAGNOSIS — E875 Hyperkalemia: Secondary | ICD-10-CM | POA: Insufficient documentation

## 2014-08-24 DIAGNOSIS — Z9489 Other transplanted organ and tissue status: Secondary | ICD-10-CM

## 2014-08-24 LAB — CBC WITH DIFFERENTIAL/PLATELET
BASO%: 0.4 % (ref 0.0–2.0)
Basophils Absolute: 0 10*3/uL (ref 0.0–0.1)
EOS ABS: 0 10*3/uL (ref 0.0–0.5)
EOS%: 0.3 % (ref 0.0–7.0)
HCT: 39.4 % (ref 38.4–49.9)
HGB: 13 g/dL (ref 13.0–17.1)
LYMPH%: 40.3 % (ref 14.0–49.0)
MCH: 30.6 pg (ref 27.2–33.4)
MCHC: 33 g/dL (ref 32.0–36.0)
MCV: 92.7 fL (ref 79.3–98.0)
MONO#: 0.4 10*3/uL (ref 0.1–0.9)
MONO%: 8.5 % (ref 0.0–14.0)
NEUT%: 50.5 % (ref 39.0–75.0)
NEUTROS ABS: 2.6 10*3/uL (ref 1.5–6.5)
Platelets: 122 10*3/uL — ABNORMAL LOW (ref 140–400)
RBC: 4.25 10*6/uL (ref 4.20–5.82)
RDW: 15.8 % — AB (ref 11.0–14.6)
WBC: 5.2 10*3/uL (ref 4.0–10.3)
lymph#: 2.1 10*3/uL (ref 0.9–3.3)

## 2014-08-24 LAB — LACTATE DEHYDROGENASE (CC13): LDH: 196 U/L (ref 125–245)

## 2014-08-24 LAB — COMPREHENSIVE METABOLIC PANEL (CC13)
ALBUMIN: 3.6 g/dL (ref 3.5–5.0)
ALT: 28 U/L (ref 0–55)
AST: 25 U/L (ref 5–34)
Alkaline Phosphatase: 71 U/L (ref 40–150)
Anion Gap: 7 mEq/L (ref 3–11)
BUN: 53.9 mg/dL — ABNORMAL HIGH (ref 7.0–26.0)
CHLORIDE: 114 meq/L — AB (ref 98–109)
CO2: 18 meq/L — AB (ref 22–29)
Calcium: 9.3 mg/dL (ref 8.4–10.4)
Creatinine: 1.6 mg/dL — ABNORMAL HIGH (ref 0.7–1.3)
GLUCOSE: 114 mg/dL (ref 70–140)
POTASSIUM: 5.8 meq/L — AB (ref 3.5–5.1)
Sodium: 139 mEq/L (ref 136–145)
TOTAL PROTEIN: 6.8 g/dL (ref 6.4–8.3)
Total Bilirubin: 0.23 mg/dL (ref 0.20–1.20)

## 2014-08-24 LAB — MAGNESIUM (CC13): Magnesium: 1.7 mg/dl (ref 1.5–2.5)

## 2014-08-24 MED ORDER — SODIUM CHLORIDE 0.9 % IV SOLN
1.0000 g | Freq: Once | INTRAVENOUS | Status: AC
  Administered 2014-08-24: 1 g via INTRAVENOUS
  Filled 2014-08-24: qty 2

## 2014-08-24 MED ORDER — HEPARIN SOD (PORK) LOCK FLUSH 100 UNIT/ML IV SOLN
500.0000 [IU] | Freq: Once | INTRAVENOUS | Status: AC
  Administered 2014-08-24: 500 [IU] via INTRAVENOUS
  Filled 2014-08-24: qty 5

## 2014-08-24 MED ORDER — INFLUENZA VAC SPLIT QUAD 0.5 ML IM SUSY
0.5000 mL | PREFILLED_SYRINGE | Freq: Once | INTRAMUSCULAR | Status: AC
  Administered 2014-08-24: 0.5 mL via INTRAMUSCULAR
  Filled 2014-08-24: qty 0.5

## 2014-08-24 MED ORDER — SODIUM CHLORIDE 0.9 % IJ SOLN
10.0000 mL | INTRAMUSCULAR | Status: DC | PRN
  Administered 2014-08-24: 10 mL via INTRAVENOUS
  Filled 2014-08-24: qty 10

## 2014-08-24 NOTE — Telephone Encounter (Signed)
per pof to sch pt appt*sent ME copy of sch-called a referral to Dr Leanord Hawking pt info-per recording left pt info & my return # and office will call back to [place]-adv pt-pt understood

## 2014-08-24 NOTE — Patient Instructions (Signed)
Dehydration, Adult Dehydration is when you lose more fluids from the body than you take in. Vital organs like the kidneys, brain, and heart cannot function without a proper amount of fluids and salt. Any loss of fluids from the body can cause dehydration.  CAUSES   Vomiting.  Diarrhea.  Excessive sweating.  Excessive urine output.  Fever. SYMPTOMS  Mild dehydration  Thirst.  Dry lips.  Slightly dry mouth. Moderate dehydration  Very dry mouth.  Sunken eyes.  Skin does not bounce back quickly when lightly pinched and released.  Dark urine and decreased urine production.  Decreased tear production.  Headache. Severe dehydration  Very dry mouth.  Extreme thirst.  Rapid, weak pulse (more than 100 beats per minute at rest).  Cold hands and feet.  Not able to sweat in spite of heat and temperature.  Rapid breathing.  Blue lips.  Confusion and lethargy.  Difficulty being awakened.  Minimal urine production.  No tears. DIAGNOSIS  Your caregiver will diagnose dehydration based on your symptoms and your exam. Blood and urine tests will help confirm the diagnosis. The diagnostic evaluation should also identify the cause of dehydration. TREATMENT  Treatment of mild or moderate dehydration can often be done at home by increasing the amount of fluids that you drink. It is best to drink small amounts of fluid more often. Drinking too much at one time can make vomiting worse. Refer to the home care instructions below. Severe dehydration needs to be treated at the hospital where you will probably be given intravenous (IV) fluids that contain water and electrolytes. HOME CARE INSTRUCTIONS   Ask your caregiver about specific rehydration instructions.  Drink enough fluids to keep your urine clear or pale yellow.  Drink small amounts frequently if you have nausea and vomiting.  Eat as you normally do.  Avoid:  Foods or drinks high in sugar.  Carbonated  drinks.  Juice.  Extremely hot or cold fluids.  Drinks with caffeine.  Fatty, greasy foods.  Alcohol.  Tobacco.  Overeating.  Gelatin desserts.  Wash your hands well to avoid spreading bacteria and viruses.  Only take over-the-counter or prescription medicines for pain, discomfort, or fever as directed by your caregiver.  Ask your caregiver if you should continue all prescribed and over-the-counter medicines.  Keep all follow-up appointments with your caregiver. SEEK MEDICAL CARE IF:  You have abdominal pain and it increases or stays in one area (localizes).  You have a rash, stiff neck, or severe headache.  You are irritable, sleepy, or difficult to awaken.  You are weak, dizzy, or extremely thirsty. SEEK IMMEDIATE MEDICAL CARE IF:   You are unable to keep fluids down or you get worse despite treatment.  You have frequent episodes of vomiting or diarrhea.  You have blood or green matter (bile) in your vomit.  You have blood in your stool or your stool looks black and tarry.  You have not urinated in 6 to 8 hours, or you have only urinated a small amount of very dark urine.  You have a fever.  You faint. MAKE SURE YOU:   Understand these instructions.  Will watch your condition.  Will get help right away if you are not doing well or get worse. Document Released: 12/08/2005 Document Revised: 03/01/2012 Document Reviewed: 07/28/2011 [employer] Patient Information 2015 [employer]. This information is not intended to replace advice given to you by your health care provider. Make sure you discuss any questions you have with your health care   provider. ° °Hypomagnesemia °Magnesium is a common ion (mineral) in the body which is needed for metabolism. It is about how the body handles food and other chemical reactions necessary for life. Only about 2% of the magnesium in our body is found in the blood. When this is low, it is called hypomagnesemia. The blood will  measure only a tiny amount of the magnesium in our body. When it is low in our blood, it does not mean that the whole body supply is low. The normal serum concentration ranges from 1.8-2.5 mEq/L. When the level gets to be less than 1.0 mEq/L, a number of problems begin to happen.  °CAUSES  °· Receiving intravenous fluids without magnesium replacement. °· Loss of magnesium from the bowel by nasogastric suction. °· Loss of magnesium from nausea and vomiting or severe diarrhea. Any of the inflammatory bowel conditions can cause this. °· Abuse of alcohol often leads to low serum magnesium. °· An inherited form of magnesium loss happens when the kidneys lose magnesium. This is called familial or primary hypomagnesemia. °· Some medications such as diuretics also cause the loss of magnesium. °SYMPTOMS  °These following problems are worse if the changes in magnesium levels come on suddenly. °· Tremor. °· Confusion. °· Muscle weakness. °· Oversensitive to sights and sounds. °· Sensitive reflexes. °· Depression. °· Muscular fibrillations. °· Overreactivity of the nerves. °· Irritability. °· Psychosis. °· Spasms of the hand muscles. °· Tetany (where the muscles go into uncontrollable spasms). °DIAGNOSIS  °This condition can be diagnosed by blood tests. °TREATMENT  °· In an emergency, magnesium can be given intravenously (by vein). °· If the condition is less worrisome, it can be corrected by diet. High levels of magnesium are found in green leafy vegetables, peas, beans, and nuts among other things. It can also be given through medications by mouth. °· If it is being caused by medications, changes can be made. °· If alcohol is a problem, help is available if there are difficulties giving it up. °Document Released: 09/03/2005 Document Revised: 04/24/2014 Document Reviewed: 07/28/2008 °[employer]® Patient Information ©2015 [employer]. This information is not intended to replace advice given to you by your health care provider.  Make sure you discuss any questions you have with your health care provider. ° °

## 2014-08-24 NOTE — Telephone Encounter (Signed)
Per staff message and POF I have scheduled appts. Advised scheduler of appts. JMW  

## 2014-08-24 NOTE — Progress Notes (Signed)
ID: Timothy [name]   DOB: 09-Jan-1946  MR#: [id number]  IEP#:[id number]  YSA:YTKZ, Timothy Saxon, MD SU: OTHER MD: Timothy [name]; Timothy Shin, MD; Timothy [name], Timothy Hatcher,MD; Timothy Rosenthal, MD; [facility] in [place], [place]:  Timothy Duval, RN [phone])  CHIEF COMPLAINT:  CLL, status post allogeneic stem cell transplant, GVHD CURRENT TREATMENT: Immunosuppression  HISTORY OF CLL: From the original intake note:  We have very complete records from Dr. Racheal Patches in [place], and in summary:  The patient was initially diagnosed in August 2000, with a white cell count of 23,600, but normal hemoglobin and platelets, and no significant symptomatology. Over the next several years his white cell count drifted up, and he eventually developed some symptoms of night sweats in particular, leading to treatment with fludarabine, Cytoxan and rituxan for five cycles given between December 2006 and May 2007.  We have CT scans from June 2006, November 2006 and April 2007, and comparing the November 2006 and April 2007 scans, there was near complete response. He had subsequent therapy in [place] as detailed below, but with decreased response, leading to allogeneic stem-cell transplant at the [facility] 02/24/2012.  Subsequent history is as detailed below.  INTERVAL HISTORY: Timothy [name] returns today with his wife, Timothy [name], for follow up of his chronic lymphoid leukemia, status post allogenic transplant in 2013. He has remained on 46m of tacrolimus BID, and is tolerating this well. He recently experienced diarrhea on Monday and Tuesday (4-5 bowel movements each), but the questran and imodium have handled that. He had no diarrhea yesterday. He had a new crown and bridge place to his teeth 2 weeks ago, and last week he had a squamous cell cancerous growth from his scalp removed that required sutures. These will be removed in 2 weeks. He still has a few  prophylactic antibiotic doses to complete. The back pain he previously complained of is "virtually non-existent." He has not needed any opioids at all for the past week or so. School has started back up and he has 3 classes this semester.  REVIEW OF SYSTEMS: TShloimadenies fevers, chills, nausea, vomiting, or rash. He is experiencing no shortness of breath, chest pain, fatigue, or cough. A detailed review of systems was otherwise negative except as noted.   PAST MEDICAL HISTORY: Past Medical History  Diagnosis Date  . 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 Diverticular disease   . Hyperlipidemia   . Obesity   . Hypertension   . Hiatal hernia   . CMV (cytomegalovirus) antibody positive     pre-transplant, with seroconversion x2 pst-transplant  . Right bundle branch block     pre-transplant  . CKD (chronic kidney disease) stage 2, GFR 60-89 ml/min   . Pancytopenia   . Steroid-induced diabetes   . Atrial fibrillation     post-transplant  . Myopathy   . 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 Leukemia, chronic lymphoid   . 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 Diarrhea in adult patient 12/05/2012    Due to active GVHD  . CLL (chronic lymphocytic leukemia) 12/05/2012    Dx 07/1999; started Rx 12/06  AlloBMT 3/13  . Rash of face 12/05/2012    Due to GVHD  . Hypomagnesemia 01/26/2013  . Left hip pain 12/01/2013    PAST SURGICAL HISTORY: Past Surgical History  Procedure Laterality Date  . Tonsillectomy and adenoidectomy    .  Bone marrow transplant    . Flexible sigmoidoscopy  11/17/2012    Procedure: FLEXIBLE SIGMOIDOSCOPY;  Surgeon: Timothy Columbia, MD;  Location: [facility] ENDOSCOPY;  Service: Endoscopy;  Laterality: N/A;  Dr Timothy [name] states will be admitted to rooom 1339 11/16/12  . Esophagogastroduodenoscopy  11/17/2012    Procedure: ESOPHAGOGASTRODUODENOSCOPY (EGD);  Surgeon: Timothy Columbia, MD;  Location: [facility];  Service: Endoscopy;  Laterality: N/A;    FAMILY HISTORY Family History  Problem Relation Age of Onset  . Cancer Father   The patient's father died from complications of chronic lymphocytic leukemia at the age of 4.  It had been diagnosed seven years before when he was 57.  The patient is enrolled in a familial chronic lymphocytic leukemia study out of the [employer].  The patient's mother is 10, alive, unfortunately suffering with dementia, and he has a brother, 64, who is otherwise in fair health.   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.  His wife of >40 years, Timothy [name], is a homemaker.  Their daughter, Timothy [name], lives in [place].  She also is a Agricultural engineer.  The patient has an 76 year old grandson and an 46-year-old granddaughter, and that is really the main reason he moved to this area.  He is a Tourist information centre manager.     ADVANCED DIRECTIVES: In place  HEALTH MAINTENANCE: (Updated 04/13/2014) History  Substance Use Topics  . Smoking status: Never Smoker   . Smokeless tobacco: Never Used  . Alcohol Use: No     Colonoscopy: Nov 2013, Dr. Watt [name]  PSA: Not on file  Bone density:  Feb 2014;  Patient also has known insufficiency and pathologic fractures in addition to his long-standing history of steroid use.  Lipid panel: Jan 2015, elevated    Allergies  Allergen Reactions  . Benadryl [Diphenhydramine Hcl]     "Restless leg syndrome"    Current Outpatient Prescriptions  Medication Sig Dispense Refill  . acyclovir (ZOVIRAX) 400 MG tablet Take 2 tablets (800 mg total) by mouth 2 (two) times daily.  120 tablet  3  . amoxicillin-clavulanate (AUGMENTIN) 875-125 MG per tablet Take 1 tablet by mouth 4 (four) times daily as  needed.  8 tablet  0  . budesonide (ENTOCORT EC) 3 MG 24 hr capsule Take 1 capsule (3 mg total) by mouth 3 (three) times daily.  90 capsule  6  . cholecalciferol (VITAMIN D) 1000 UNITS tablet Take 1,000 Units by mouth every evening.       . cholestyramine (QUESTRAN) 4 G packet Take 1 packet (4 g total) by mouth daily.  30 each  12  . diltiazem (CARDIZEM CD) 240 MG 24 hr capsule       . diltiazem (CARDIZEM CD) 240 MG 24 hr capsule TAKE 1 CAPSULE BY MOUTH DAILY  30 capsule  3  . diltiazem (DILACOR XR) 240 MG 24 hr capsule Take 1 capsule (240 mg total) by mouth every evening.  30 capsule  5  . fluconazole (DIFLUCAN) 100 MG tablet Take 1 tablet (100 mg total) by mouth daily.  30 tablet  6  . furosemide (LASIX) 20 MG tablet Take 1 tablet (20 mg total) by mouth daily.  30 tablet  5  . glucose blood (ONE TOUCH ULTRA TEST) test strip Test before meals and at bedtime.  300 each  0  . HYDROcodone-acetaminophen (NORCO) 7.5-325 MG per tablet Take 1 tablet by mouth every 4 (four) hours as needed for moderate pain.  120 tablet  0  . Insulin Aspart Prot & Aspart (NOVOLOG 70/30 MIX) (70-30) 100 UNIT/ML Pen Inject 10 Units into the skin 2 (two) times daily. 10 units with breakfast, 10 units with dinner      . Insulin Pen Needle (B-D UF III MINI PEN NEEDLES) 31G X 5 MM MISC Use twice daily with insulin as directed  100 each  5  . labetalol (NORMODYNE) 200 MG tablet Take 400 mg by mouth 2 (two) times daily.       Marland Kitchen lidocaine (LIDODERM) 5 % Place 1 patch onto the skin daily. Remove & Discard patch within 12 hours or as directed by MD  30 patch  0  . Lidocaine-Hydrocortisone Ace 3-0.5 % KIT Apply 1 application topically as needed (for pain).  1 each  3  . lidocaine-prilocaine (EMLA) cream Apply 1 application topically as needed. 1-2 hrs before each port access  30 g  6  . lisinopril (PRINIVIL,ZESTRIL) 10 MG tablet Take 10 mg by mouth every evening.       . loratadine (CLARITIN) 10 MG tablet Take 10 mg by mouth daily.       . Multiple Vitamin (MULTIVITAMIN WITH MINERALS) TABS tablet Take 1 tablet by mouth daily.      Marland Kitchen omeprazole (PRILOSEC) 20 MG capsule Take 1 capsule (20 mg total) by mouth daily.  30 capsule  5  . ONETOUCH DELICA LANCETS 46N MISC Test before meals and at bedtime.  300 each  0  . predniSONE (DELTASONE) 5 MG tablet Take 0.5-1 tablets (2.5-5 mg total) by mouth 2 (two) times daily. Takes 65m in the morning and 2.551min the evening.  45 tablet  2  . sertraline (ZOLOFT) 50 MG tablet ALTERNATE TAKING 1 TABLET DAILY AND 2 TABLETS DAILY  90 tablet  5  . sulfamethoxazole-trimethoprim (BACTRIM DS) 800-160 MG per tablet Take 1 tablet by mouth daily.  30 tablet  5  . tacrolimus (PROGRAF) 0.5 MG capsule TAKE 2 capsules in the morning and 2 in the evening      . vancomycin (VANCOCIN) 125 MG capsule       . VOLTAREN 1 % GEL Apply 2 g topically 2 (two) times daily. Applied to back      . [DISCONTINUED] insulin aspart (NOVOLOG FLEXPEN) 100 UNIT/ML SOPN FlexPen 18units sq qam, 9units sq qpm, or as directed  15 mL  1   No current facility-administered medications for this visit.   Facility-Administered Medications Ordered in Other Visits  Medication Dose Route Frequency Provider Last Rate Last Dose  . 0.9 %  sodium chloride infusion   Intravenous Continuous Amy G Berry, PA-C 500 mL/hr at 03/12/13 0900    . sodium chloride 0.9 % injection 10 mL  10 mL Intravenous PRN GuChauncey CruelMD   10 mL at 08/11/12 1606  . sodium chloride 0.9 % injection 10 mL  10 mL Intravenous PRN GuChauncey CruelMD   10 mL at 08/24/14 1612    OBJECTIVE: Middle-aged white man in no acute distress Filed Vitals:   08/24/14 1330  BP: 129/73  Pulse: 70  Temp: 98.4 F (36.9 C)  Resp: 18  Body mass index is 34.14 kg/(m^2).  ECOG: 1 Filed Weights   08/24/14 1330  Weight: 211 lb 6.4 oz (95.89 kg)   Skin: warm dry, ecchymotic  on hands and forearms, 4 inch suture line to scalp clean dry and intact HEENT: sclerae anicteric,  conjunctivae pink, oropharynx clear. No thrush or mucositis.  Lymph Nodes: No cervical or supraclavicular lymphadenopathy  Lungs: clear to auscultation bilaterally, no rales, wheezes, or rhonci  Heart: regular rate and rhythm  Abdomen: round, soft, non tender, positive bowel sounds  Musculoskeletal: No focal spinal tenderness, no peripheral edema  Neuro: non focal, well oriented, positive affect   LABS:  CBC    Component Value Date/Time   WBC 5.2 08/24/2014 1309   WBC 7.1 04/06/2014 0949   RBC 4.25 08/24/2014 1309   RBC 4.06* 04/06/2014 0949   RBC 3.82* 03/16/2013 1400   HGB 13.0 08/24/2014 1309   HGB 12.2* 04/06/2014 0949   HCT 39.4 08/24/2014 1309   HCT 38.5* 04/06/2014 0949   PLT 122* 08/24/2014 1309   PLT 144* 04/06/2014 0949   MCV 92.7 08/24/2014 1309   MCV 94.8 04/06/2014 0949   MCH 30.6 08/24/2014 1309   MCH 30.0 04/06/2014 0949   MCHC 33.0 08/24/2014 1309   MCHC 31.7 04/06/2014 0949   RDW 15.8* 08/24/2014 1309   RDW 15.7* 04/06/2014 0949   LYMPHSABS 2.1 08/24/2014 1309   LYMPHSABS 1.4 03/18/2013 0615   MONOABS 0.4 08/24/2014 1309   MONOABS 0.3 03/18/2013 0615   EOSABS 0.0 08/24/2014 1309   EOSABS 0.0 03/18/2013 0615   BASOSABS 0.0 08/24/2014 1309   BASOSABS 0.0 03/18/2013 0615       Chemistry      Component Value Date/Time   NA 139 08/24/2014 1310   NA 131* 06/14/2013 0800   K 5.8* 08/24/2014 1310   K 4.7 06/14/2013 0800   CL 101 06/15/2013 1034   CL 97 06/14/2013 0800   CO2 18* 08/24/2014 1310   CO2 24 06/14/2013 0800   BUN 53.9* 08/24/2014 1310   BUN 38* 06/14/2013 0800   CREATININE 1.6* 08/24/2014 1310   CREATININE 0.97 06/14/2013 0800      Component Value Date/Time   CALCIUM 9.3 08/24/2014 1310   CALCIUM 9.1 06/14/2013 0800   ALKPHOS 71 08/24/2014 1310   ALKPHOS 75 04/18/2013 1218   AST 25 08/24/2014 1310   AST 24 04/18/2013 1218   ALT 28 08/24/2014 1310   ALT 33 04/18/2013 1218   BILITOT 0.23 08/24/2014 1310   BILITOT 0.2* 04/18/2013 1218     STUDIES: Mr Thoracic Spine W Wo Contrast  08/09/2014    CLINICAL DATA:  Mid to low back pain for 3 weeks. History of chronic lymphocytic leukemia.  EXAM: MRI THORACIC AND LUMBAR SPINE WITHOUT AND WITH CONTRAST  TECHNIQUE: Multiplanar and multiecho pulse sequences of the thoracic and lumbar spine were obtained without and with intravenous contrast.  CONTRAST:  73m MULTIHANCE GADOBENATE DIMEGLUMINE 529 MG/ML IV SOLN  COMPARISON:  CT chest, abdomen, and pelvis 02/21/2014  FINDINGS: MR THORACIC SPINE FINDINGS  Vertebral alignment is normal. Numerous Schmorl's nodes are again seen throughout the thoracic spine. Mild T12 compression fracture demonstrates unchanged height loss compared to 08/17/2013 with minimal marrow edema and enhancement subjacent to the superior endplate left of midline. Minimal anterior wedging of multiple other mid to lower thoracic vertebral bodies is unchanged. No acute compression fracture is identified. Vertebral bone marrow signal is mildly heterogeneous diffusely with scattered areas of fatty degenerative marrow changes, most prominently T6-7. Small central disc protrusions are present at T5-6 and T7-8 without spinal stenosis. There is a moderate central disc protrusion at T8-9 which contacts and mildly deforms  the spinal cord without spinal canal stenosis. Small to moderate central disc protrusion is also present at T12-L1 without cord contact or deformation. The thoracic spinal cord is normal in signal. Paraspinal soft tissues are unremarkable.  MR LUMBAR SPINE FINDINGS  Vertebral alignment is normal. Mild L3 and L4 compression fractures are again seen status post cement augmentation without evidence of significant progressive height loss. Mild L5 compression fracture is similar to prior CT and is without marrow edema. Minimal L2 superior endplate depression is also unchanged without marrow edema. Multilevel degenerative marrow changes are present. Multiple Schmorl's nodes are again seen. Conus medullaris is normal in signal and terminates at L2.  Heterogeneous marrow signal is partially visualized in the right sacral ala corresponding to heterogeneous sclerosis and insufficiency fracture described on prior CT.  L1-2:  Negative.  L2-3: Circumferential disc bulging, minimal posterior displacement of the L3 superior endplate, and mild facet and ligamentum flavum hypertrophy result in mild spinal stenosis without neural foraminal stenosis.  L3-4: Mild disc bulging and mild facet and ligamentum flavum hypertrophy without spinal canal or neural foraminal stenosis.  L4-5: Mild-to-moderate circumferential disc bulging and moderate facet and ligamentum flavum hypertrophy result in mild to moderate spinal stenosis and mild right greater than left neural foraminal stenosis.  L5-S1: Mild disc bulge and moderate facet and ligamentum flavum hypertrophy result in mild spinal stenosis without significant neural foraminal stenosis.  IMPRESSION: 1. Multiple mild compression fractures in the thoracic and lumbar spine without significant interval change in degree of height loss compared to prior CTs. Mild residual marrow edema is noted at T12. 2. Multiple small thoracic disc protrusions, most notable at T8-9 where the disc contacts and mildly deforms the spinal cord without stenosis. 3. Moderate lumbar spondylosis, greatest at L4-5 where there is mild-to-moderate spinal stenosis.   Electronically Signed   By: Logan Bores   On: 08/09/2014 10:34   Mr Lumbar Spine W Wo Contrast  08/09/2014   CLINICAL DATA:  Mid to low back pain for 3 weeks. History of chronic lymphocytic leukemia.  EXAM: MRI THORACIC AND LUMBAR SPINE WITHOUT AND WITH CONTRAST  TECHNIQUE: Multiplanar and multiecho pulse sequences of the thoracic and lumbar spine were obtained without and with intravenous contrast.  CONTRAST:  22m MULTIHANCE GADOBENATE DIMEGLUMINE 529 MG/ML IV SOLN  COMPARISON:  CT chest, abdomen, and pelvis 02/21/2014  FINDINGS: MR THORACIC SPINE FINDINGS  Vertebral alignment is normal.  Numerous Schmorl's nodes are again seen throughout the thoracic spine. Mild T12 compression fracture demonstrates unchanged height loss compared to 08/17/2013 with minimal marrow edema and enhancement subjacent to the superior endplate left of midline. Minimal anterior wedging of multiple other mid to lower thoracic vertebral bodies is unchanged. No acute compression fracture is identified. Vertebral bone marrow signal is mildly heterogeneous diffusely with scattered areas of fatty degenerative marrow changes, most prominently T6-7. Small central disc protrusions are present at T5-6 and T7-8 without spinal stenosis. There is a moderate central disc protrusion at T8-9 which contacts and mildly deforms the spinal cord without spinal canal stenosis. Small to moderate central disc protrusion is also present at T12-L1 without cord contact or deformation. The thoracic spinal cord is normal in signal. Paraspinal soft tissues are unremarkable.  MR LUMBAR SPINE FINDINGS  Vertebral alignment is normal. Mild L3 and L4 compression fractures are again seen status post cement augmentation without evidence of significant progressive height loss. Mild L5 compression fracture is similar to prior CT and is without marrow edema. Minimal L2 superior endplate depression  is also unchanged without marrow edema. Multilevel degenerative marrow changes are present. Multiple Schmorl's nodes are again seen. Conus medullaris is normal in signal and terminates at L2. Heterogeneous marrow signal is partially visualized in the right sacral ala corresponding to heterogeneous sclerosis and insufficiency fracture described on prior CT.  L1-2:  Negative.  L2-3: Circumferential disc bulging, minimal posterior displacement of the L3 superior endplate, and mild facet and ligamentum flavum hypertrophy result in mild spinal stenosis without neural foraminal stenosis.  L3-4: Mild disc bulging and mild facet and ligamentum flavum hypertrophy without spinal  canal or neural foraminal stenosis.  L4-5: Mild-to-moderate circumferential disc bulging and moderate facet and ligamentum flavum hypertrophy result in mild to moderate spinal stenosis and mild right greater than left neural foraminal stenosis.  L5-S1: Mild disc bulge and moderate facet and ligamentum flavum hypertrophy result in mild spinal stenosis without significant neural foraminal stenosis.  IMPRESSION: 1. Multiple mild compression fractures in the thoracic and lumbar spine without significant interval change in degree of height loss compared to prior CTs. Mild residual marrow edema is noted at T12. 2. Multiple small thoracic disc protrusions, most notable at T8-9 where the disc contacts and mildly deforms the spinal cord without stenosis. 3. Moderate lumbar spondylosis, greatest at L4-5 where there is mild-to-moderate spinal stenosis.   Electronically Signed   By: Logan Bores   On: 08/09/2014 10:34   ASSESSMENT: 68 y.o. [place] man with a history of well-differentiated lymphocytic lymphoma/ chronic lymphoid leukemia initially diagnosed in 2000, not requiring intervention until 2006; with multiple chromosomal abnormalities.  His treatment history is as follows:  (1) fludarabine/cyclophosphamide/rituximab x5 completed May 2007.   (2) rituximab for 8 doses October 2010, with partial response   (3) Leustatin and ofatumumab weekly x8 July to September 2011 followed by maintenance ofatumumab  every 2 months, with initial response but rising counts September 2012   (4) status-post unrelated donor stem-cell transplant 02/24/2012 at [facility]  (a) conditioning regimen consisted of fludarabine + TBI at 200 cGy, followed by rituximab x27;  (b) CMV reactivation x3 (patient CMV positive, donor negative), s/p ganciclovir treatment; 3d reactivation August 2013, s/p gancyclovir, with negative PCR mid-September 2013; last gancyclovir dose 10/06/2012 (c) Chronic GVHD: involving gut and skin, treated with  steroids, tacrolimus and MMF.  MMF was eventually d/c'd and tacrolimus currently at a dose of 1.21m BID (d) atrial fibrillation: resolved on brief amiodarone regimen (e) steroid-induced myopathy: improving  (f) hypomagnesemia: improved after d/c gancyclovir, needs continuing support (g) hypogammaglobulinemia: requiring IVIG most recently 08/03/2014. (h) history of elevated triglycerides (606 on 07/14/2012)  (i) adrenal insufficiency: on prednisone and budesonide (j) pancytopenia,resolved (k) brief episode of neutropenia ([id number]) February 2015, accompanied by diarrhea; resolved   (5) restaging studies February-March 2015 including CT scans, flow cytometry, and bone marrow biopsy, showed no evidence of residual chronic lymphoid leukemia.  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 Status post kyphoplasty at L3-4 in June 2014. Also with evidence of rib fractures and insufficiency fractures bilaterally of the sacral  alae, noted by CT in March 2015. --  Denosumab started 12/08/2013, given as prolia Q6 months which is what has been approved by his insurance, most recent dose 03/30/2014  (12) chronic back pain and hip pain controlled with OxyContin and hydrocodone/APAP.  (12) nausea: well controlled on current meds  (13)  Positive c.diff, 03/08/2013, on Flagyl 500 mg TID x 20 days, then on oral vanco with Questran, showing improvement; positive when repeated April 2014;  Negative x 3   (14) persistently increased BUN and potassium  (15)  Hypertension, on labetalol, cardizem, lisinopril, and furosemide; managed by Dr. Brigitte Pulse  (16) steroid induced hyperglycemia, on sterlix and 70/30 insulin, followed by Dr. Loanne Drilling and Dr. Brigitte Pulse  (17) hypogammaglobulinemia-- requiring intermittent supplementation Results for QUASHAWN, JEWKES (MRN [id number]) as of 07/27/2014 13:21  Ref. Range 05/04/2014 14:03 05/25/2014 11:17 06/01/2014 13:07 06/22/2014 12:01 07/13/2014 10:39  IgG (Immunoglobin G), Serum Latest Range: [phone] mg/dL 355 (L) [phone] (L) 339 (L)   (18) squamous cell CA in situ removed from left parietal scalp October 2014, second lesion to be removed may 2015   PLAN: Corie was scared his gastrointestinal GVHD had returned this Monday, but his diarrhea has resolved at this moment. He will continue to use imodium and questran as needed. We will make no changes to his tacrolimus or steroid doses at this time.   The labs were reviewed in detail with him, and again we see an elevation in his potassium and creatinine, despite stopping the multivitamin weeks ago and daily use of lasix When his potassium is normal, his creatinine is also. There is perhaps an underlying kidney issue at play here, so Dr. Jana Hakim suggests referring him to Dr. Jamal Maes, a nephrologist.   At this time Sosaia's IgG and tacrolimus levels are not available to discuss. His magnesium level remains 1.7 and he will continue with weekly magnesium infusions.   Nalin will return weekly for labs. His next office visit will be 9/17 with Dr. Jana Hakim. He understands and agrees with this plan. He has been encouraged to call with any issues that might arise before his next visit here.  [name], NP 08/24/2014

## 2014-08-25 ENCOUNTER — Telehealth: Payer: Self-pay | Admitting: Oncology

## 2014-08-25 ENCOUNTER — Ambulatory Visit: Payer: Self-pay | Admitting: Nurse Practitioner

## 2014-08-25 LAB — IGG, IGA, IGM
IGM, SERUM: 8 mg/dL — AB (ref 41–251)
IgG (Immunoglobin G), Serum: 1080 mg/dL (ref 650–1600)

## 2014-08-25 LAB — TACROLIMUS LEVEL: Tacrolimus Lvl: 13.2 ng/mL (ref 5.0–20.0)

## 2014-08-25 NOTE — Telephone Encounter (Signed)
recvd fax from [facility] out referral form and gave to [facility] to give to HIM-printed copy of referral to send with it-kept copy

## 2014-08-26 ENCOUNTER — Telehealth: Payer: Self-pay | Admitting: Nurse Practitioner

## 2014-08-26 NOTE — Telephone Encounter (Signed)
Returned call to pt from a voicemail left on 9/4-pt stated was concerned an infusion appt was left off his sch on 10/8-adv per pof there was no infusion to be sch but I would send HF email to verify-adv pt I would call once reply

## 2014-08-29 ENCOUNTER — Telehealth: Payer: Self-pay | Admitting: Oncology

## 2014-08-29 NOTE — Telephone Encounter (Signed)
Faxed pt medical records to [facility] °

## 2014-08-31 ENCOUNTER — Other Ambulatory Visit: Payer: Self-pay | Admitting: *Deleted

## 2014-08-31 ENCOUNTER — Other Ambulatory Visit (HOSPITAL_BASED_OUTPATIENT_CLINIC_OR_DEPARTMENT_OTHER): Payer: BC Managed Care – PPO

## 2014-08-31 ENCOUNTER — Ambulatory Visit (HOSPITAL_BASED_OUTPATIENT_CLINIC_OR_DEPARTMENT_OTHER): Payer: BC Managed Care – PPO

## 2014-08-31 DIAGNOSIS — T8609 Other complications of bone marrow transplant: Principal | ICD-10-CM

## 2014-08-31 DIAGNOSIS — C9110 Chronic lymphocytic leukemia of B-cell type not having achieved remission: Secondary | ICD-10-CM

## 2014-08-31 DIAGNOSIS — D89811 Chronic graft-versus-host disease: Secondary | ICD-10-CM

## 2014-08-31 DIAGNOSIS — E291 Testicular hypofunction: Secondary | ICD-10-CM

## 2014-08-31 LAB — CBC WITH DIFFERENTIAL/PLATELET
BASO%: 0.6 % (ref 0.0–2.0)
BASOS ABS: 0 10*3/uL (ref 0.0–0.1)
EOS%: 0.6 % (ref 0.0–7.0)
Eosinophils Absolute: 0 10*3/uL (ref 0.0–0.5)
HCT: 40.1 % (ref 38.4–49.9)
HEMOGLOBIN: 12.9 g/dL — AB (ref 13.0–17.1)
LYMPH%: 37.8 % (ref 14.0–49.0)
MCH: 29.9 pg (ref 27.2–33.4)
MCHC: 32.2 g/dL (ref 32.0–36.0)
MCV: 92.9 fL (ref 79.3–98.0)
MONO#: 0.4 10*3/uL (ref 0.1–0.9)
MONO%: 7.8 % (ref 0.0–14.0)
NEUT#: 2.4 10*3/uL (ref 1.5–6.5)
NEUT%: 53.2 % (ref 39.0–75.0)
Platelets: 110 10*3/uL — ABNORMAL LOW (ref 140–400)
RBC: 4.32 10*6/uL (ref 4.20–5.82)
RDW: 15.4 % — ABNORMAL HIGH (ref 11.0–14.6)
WBC: 4.6 10*3/uL (ref 4.0–10.3)
lymph#: 1.7 10*3/uL (ref 0.9–3.3)

## 2014-08-31 LAB — COMPREHENSIVE METABOLIC PANEL (CC13)
ALT: 36 U/L (ref 0–55)
AST: 28 U/L (ref 5–34)
Albumin: 3.5 g/dL (ref 3.5–5.0)
Alkaline Phosphatase: 69 U/L (ref 40–150)
Anion Gap: 7 mEq/L (ref 3–11)
BILIRUBIN TOTAL: 0.29 mg/dL (ref 0.20–1.20)
BUN: 39.5 mg/dL — AB (ref 7.0–26.0)
CO2: 14 mEq/L — ABNORMAL LOW (ref 22–29)
CREATININE: 1.3 mg/dL (ref 0.7–1.3)
Calcium: 9.4 mg/dL (ref 8.4–10.4)
Chloride: 115 mEq/L — ABNORMAL HIGH (ref 98–109)
Glucose: 207 mg/dl — ABNORMAL HIGH (ref 70–140)
Potassium: 5.5 mEq/L — ABNORMAL HIGH (ref 3.5–5.1)
Sodium: 136 mEq/L (ref 136–145)
Total Protein: 6.4 g/dL (ref 6.4–8.3)

## 2014-08-31 LAB — MAGNESIUM (CC13): Magnesium: 1.5 mg/dl (ref 1.5–2.5)

## 2014-08-31 MED ORDER — SODIUM CHLORIDE 0.9 % IJ SOLN
10.0000 mL | Freq: Once | INTRAMUSCULAR | Status: AC
  Administered 2014-08-31: 10 mL via INTRAVENOUS
  Filled 2014-08-31: qty 10

## 2014-08-31 MED ORDER — MAGNESIUM SULFATE 50 % IJ SOLN
1.0000 g | Freq: Once | INTRAMUSCULAR | Status: AC
  Administered 2014-08-31: 1 g via INTRAVENOUS
  Filled 2014-08-31: qty 2

## 2014-08-31 MED ORDER — HEPARIN SOD (PORK) LOCK FLUSH 100 UNIT/ML IV SOLN
500.0000 [IU] | Freq: Once | INTRAVENOUS | Status: AC | PRN
  Administered 2014-08-31: 500 [IU]
  Filled 2014-08-31: qty 5

## 2014-08-31 NOTE — Patient Instructions (Signed)
Hypomagnesemia Magnesium is a common ion (mineral) in the body which is needed for metabolism. It is about how the body handles food and other chemical reactions necessary for life. Only about 2% of the magnesium in our body is found in the blood. When this is low, it is called hypomagnesemia. The blood will measure only a tiny amount of the magnesium in our body. When it is low in our blood, it does not mean that the whole body supply is low. The normal serum concentration ranges from 1.8-2.5 mEq/L. When the level gets to be less than 1.0 mEq/L, a number of problems begin to happen.  CAUSES   Receiving intravenous fluids without magnesium replacement.  Loss of magnesium from the bowel by nasogastric suction.  Loss of magnesium from nausea and vomiting or severe diarrhea. Any of the inflammatory bowel conditions can cause this.  Abuse of alcohol often leads to low serum magnesium.  An inherited form of magnesium loss happens when the kidneys lose magnesium. This is called familial or primary hypomagnesemia.  Some medications such as diuretics also cause the loss of magnesium. SYMPTOMS  These following problems are worse if the changes in magnesium levels come on suddenly.  Tremor.  Confusion.  Muscle weakness.  Oversensitive to sights and sounds.  Sensitive reflexes.  Depression.  Muscular fibrillations.  Overreactivity of the nerves.  Irritability.  Psychosis.  Spasms of the hand muscles.  Tetany (where the muscles go into uncontrollable spasms). DIAGNOSIS  This condition can be diagnosed by blood tests. TREATMENT   In an emergency, magnesium can be given intravenously (by vein).  If the condition is less worrisome, it can be corrected by diet. High levels of magnesium are found in green leafy vegetables, peas, beans, and nuts among other things. It can also be given through medications by mouth.  If it is being caused by medications, changes can be made.  If  alcohol is a problem, help is available if there are difficulties giving it up. Document Released: 09/03/2005 Document Revised: 04/24/2014 Document Reviewed: 07/28/2008 [employer] Patient Information 2015 [employer]. This information is not intended to replace advice given to you by your health care provider. Make sure you discuss any questions you have with your health care provider.  

## 2014-09-01 ENCOUNTER — Encounter: Payer: Self-pay | Admitting: *Deleted

## 2014-09-01 ENCOUNTER — Other Ambulatory Visit: Payer: Self-pay | Admitting: Oncology

## 2014-09-07 ENCOUNTER — Other Ambulatory Visit: Payer: Self-pay | Admitting: Emergency Medicine

## 2014-09-07 ENCOUNTER — Telehealth: Payer: Self-pay | Admitting: Oncology

## 2014-09-07 ENCOUNTER — Ambulatory Visit (HOSPITAL_BASED_OUTPATIENT_CLINIC_OR_DEPARTMENT_OTHER): Payer: BC Managed Care – PPO | Admitting: Oncology

## 2014-09-07 ENCOUNTER — Ambulatory Visit (HOSPITAL_BASED_OUTPATIENT_CLINIC_OR_DEPARTMENT_OTHER): Payer: BC Managed Care – PPO

## 2014-09-07 ENCOUNTER — Other Ambulatory Visit (HOSPITAL_BASED_OUTPATIENT_CLINIC_OR_DEPARTMENT_OTHER): Payer: BC Managed Care – PPO

## 2014-09-07 ENCOUNTER — Telehealth: Payer: Self-pay | Admitting: *Deleted

## 2014-09-07 VITALS — BP 125/70 | HR 78 | Temp 98.0°F | Resp 18 | Ht 66.0 in | Wt 210.5 lb

## 2014-09-07 DIAGNOSIS — D89811 Chronic graft-versus-host disease: Secondary | ICD-10-CM

## 2014-09-07 DIAGNOSIS — T8609 Other complications of bone marrow transplant: Principal | ICD-10-CM

## 2014-09-07 DIAGNOSIS — E46 Unspecified protein-calorie malnutrition: Secondary | ICD-10-CM

## 2014-09-07 DIAGNOSIS — I10 Essential (primary) hypertension: Secondary | ICD-10-CM

## 2014-09-07 DIAGNOSIS — C9110 Chronic lymphocytic leukemia of B-cell type not having achieved remission: Secondary | ICD-10-CM

## 2014-09-07 DIAGNOSIS — E860 Dehydration: Secondary | ICD-10-CM

## 2014-09-07 DIAGNOSIS — M549 Dorsalgia, unspecified: Secondary | ICD-10-CM

## 2014-09-07 LAB — COMPREHENSIVE METABOLIC PANEL
ALK PHOS: 80 U/L (ref 39–117)
ALT: 55 U/L — AB (ref 0–53)
AST: 40 U/L — ABNORMAL HIGH (ref 0–37)
Albumin: 3.4 g/dL — ABNORMAL LOW (ref 3.5–5.2)
BUN: 46 mg/dL — ABNORMAL HIGH (ref 6–23)
CO2: 15 meq/L — AB (ref 19–32)
Calcium: 9.1 mg/dL (ref 8.4–10.5)
Chloride: 110 mEq/L (ref 96–112)
Creatinine, Ser: 1.2 mg/dL (ref 0.50–1.35)
Glucose, Bld: 172 mg/dL — ABNORMAL HIGH (ref 70–99)
Potassium: 4.8 mEq/L (ref 3.5–5.3)
SODIUM: 139 meq/L (ref 135–145)
Total Bilirubin: <0.2 mg/dL — ABNORMAL LOW (ref 0.2–1.2)
Total Protein: 6.4 g/dL (ref 6.0–8.3)

## 2014-09-07 LAB — CBC WITH DIFFERENTIAL/PLATELET
BASO%: 0 % (ref 0.0–2.0)
BASOS ABS: 0 10*3/uL (ref 0.0–0.1)
EOS%: 0.2 % (ref 0.0–7.0)
Eosinophils Absolute: 0 10*3/uL (ref 0.0–0.5)
HEMATOCRIT: 38.6 % (ref 38.4–49.9)
HGB: 12.9 g/dL — ABNORMAL LOW (ref 13.0–17.1)
LYMPH%: 34.2 % (ref 14.0–49.0)
MCH: 30.6 pg (ref 27.2–33.4)
MCHC: 33.4 g/dL (ref 32.0–36.0)
MCV: 91.7 fL (ref 79.3–98.0)
MONO#: 0.3 10*3/uL (ref 0.1–0.9)
MONO%: 6.5 % (ref 0.0–14.0)
NEUT#: 2.8 10*3/uL (ref 1.5–6.5)
NEUT%: 59.1 % (ref 39.0–75.0)
Platelets: 105 10*3/uL — ABNORMAL LOW (ref 140–400)
RBC: 4.21 10*6/uL (ref 4.20–5.82)
RDW: 15.8 % — ABNORMAL HIGH (ref 11.0–14.6)
WBC: 4.8 10*3/uL (ref 4.0–10.3)
lymph#: 1.6 10*3/uL (ref 0.9–3.3)

## 2014-09-07 MED ORDER — SODIUM CHLORIDE 0.9 % IV SOLN
Freq: Once | INTRAVENOUS | Status: AC
  Administered 2014-09-07: 14:00:00 via INTRAVENOUS

## 2014-09-07 MED ORDER — HEPARIN SOD (PORK) LOCK FLUSH 100 UNIT/ML IV SOLN
500.0000 [IU] | Freq: Once | INTRAVENOUS | Status: AC | PRN
  Administered 2014-09-07: 500 [IU]
  Filled 2014-09-07: qty 5

## 2014-09-07 MED ORDER — SODIUM CHLORIDE 0.9 % IV SOLN
1.0000 g | Freq: Once | INTRAVENOUS | Status: AC
  Administered 2014-09-07: 1 g via INTRAVENOUS
  Filled 2014-09-07: qty 2

## 2014-09-07 MED ORDER — TACROLIMUS 0.5 MG PO CAPS: 1.5000 mg | ORAL_CAPSULE | Freq: Two times a day (BID) | ORAL | Status: DC

## 2014-09-07 MED ORDER — SODIUM CHLORIDE 0.9 % IJ SOLN
10.0000 mL | Freq: Once | INTRAMUSCULAR | Status: AC
  Administered 2014-09-07: 10 mL via INTRAVENOUS
  Filled 2014-09-07: qty 10

## 2014-09-07 MED ORDER — TACROLIMUS 0.5 MG PO CAPS: 0.5000 mg | ORAL_CAPSULE | Freq: Two times a day (BID) | ORAL | Status: DC

## 2014-09-07 NOTE — Patient Instructions (Signed)
Hypomagnesemia Magnesium is a common ion (mineral) in the body which is needed for metabolism. It is about how the body handles food and other chemical reactions necessary for life. Only about 2% of the magnesium in our body is found in the blood. When this is low, it is called hypomagnesemia. The blood will measure only a tiny amount of the magnesium in our body. When it is low in our blood, it does not mean that the whole body supply is low. The normal serum concentration ranges from 1.8-2.5 mEq/L. When the level gets to be less than 1.0 mEq/L, a number of problems begin to happen.  CAUSES   Receiving intravenous fluids without magnesium replacement.  Loss of magnesium from the bowel by nasogastric suction.  Loss of magnesium from nausea and vomiting or severe diarrhea. Any of the inflammatory bowel conditions can cause this.  Abuse of alcohol often leads to low serum magnesium.  An inherited form of magnesium loss happens when the kidneys lose magnesium. This is called familial or primary hypomagnesemia.  Some medications such as diuretics also cause the loss of magnesium. SYMPTOMS  These following problems are worse if the changes in magnesium levels come on suddenly.  Tremor.  Confusion.  Muscle weakness.  Oversensitive to sights and sounds.  Sensitive reflexes.  Depression.  Muscular fibrillations.  Overreactivity of the nerves.  Irritability.  Psychosis.  Spasms of the hand muscles.  Tetany (where the muscles go into uncontrollable spasms). DIAGNOSIS  This condition can be diagnosed by blood tests. TREATMENT   In an emergency, magnesium can be given intravenously (by vein).  If the condition is less worrisome, it can be corrected by diet. High levels of magnesium are found in green leafy vegetables, peas, beans, and nuts among other things. It can also be given through medications by mouth.  If it is being caused by medications, changes can be made.  If  alcohol is a problem, help is available if there are difficulties giving it up. Document Released: 09/03/2005 Document Revised: 04/24/2014 Document Reviewed: 07/28/2008 [employer] Patient Information 2015 [employer]. This information is not intended to replace advice given to you by your health care provider. Make sure you discuss any questions you have with your health care provider.  

## 2014-09-07 NOTE — Progress Notes (Signed)
ID: [name]   DOB: 29-Aug-1946  MR#: [id number]  KGM#:[id number]  UYQ:IHKV, [name], MD SU: OTHER MD: [name]; [name], MD; [name], [name],MD; [name], MD; [facility] in [place], [place]:  [name], RN [phone])  CHIEF COMPLAINT:  CLL, status post allogeneic stem cell transplant, GVHD CURRENT TREATMENT: Immunosuppression  HISTORY OF CLL: From the original intake note:  We have very complete records from Dr. Racheal Patches in [place], and in summary:  The patient was initially diagnosed in August 2000, with a white cell count of 23,600, but normal hemoglobin and platelets, and no significant symptomatology. Over the next several years his white cell count drifted up, and he eventually developed some symptoms of night sweats in particular, leading to treatment with fludarabine, Cytoxan and rituxan for five cycles given between December 2006 and May 2007.  We have CT scans from June 2006, November 2006 and April 2007, and comparing the November 2006 and April 2007 scans, there was near complete response. He had subsequent therapy in [place] as detailed below, but with decreased response, leading to allogeneic stem-cell transplant at the [facility] 02/24/2012.  Subsequent history is as detailed below.  INTERVAL HISTORY: Kerrie returns today with his wife, [name], for follow up of his chronic lymphoid leukemia. He will soon be 3 years out from his transplant, which is very favorable. We continue to supplement his magnesium on a weekly basis now and we are going back to seeing him every 3 weeks instead of every 2 weeks.  REVIEW OF SYSTEMS: Crimson woke up today with the hemorrhage in his right eye. He tells me he gets these 2 or 3 times a year. He has had quite a few sinus and allergy symptoms so I think you must have been rubbing his eye. There is no other bleeding. She has had no fever, known  drenching sweats, and no worsening fatigue or weight loss. His back pain is pretty much gone. He is having a rash around his neck but not in any other parts of his body. He has no problems with his mouth at present. He continues to occasionally have diarrhea, meaning that in a normal day he will have 2 or 3 soft but not liquid bowel movements and then for a day or 2 he may have one or 2 liquid bowel movements. He had another squamous cell carcinoma removed by Dr. Ubaldo Glassing. He just had his stitches removed. His dental work was recently improved. Aside from these issues a detailed review of systems today was otherwise stable.  PAST MEDICAL HISTORY: Past Medical History  Diagnosis Date  . 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 Diverticular disease   . Hyperlipidemia   . Obesity   . Hypertension   . Hiatal hernia   . CMV (cytomegalovirus) antibody positive     pre-transplant, with seroconversion x2 pst-transplant  . Right bundle branch block     pre-transplant  . CKD (chronic kidney disease) stage 2, GFR 60-89 ml/min   . Pancytopenia   . Steroid-induced diabetes   . Atrial fibrillation     post-transplant  . Myopathy   . 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 Leukemia, chronic lymphoid   . 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 Diarrhea in adult patient 12/05/2012    Due to active GVHD  . CLL (chronic lymphocytic leukemia) 12/05/2012  Dx 07/1999; started Rx 12/06  AlloBMT 3/13  . Rash of face 12/05/2012    Due to GVHD  . Hypomagnesemia 01/26/2013  . Left hip pain 12/01/2013    PAST SURGICAL HISTORY: Past Surgical History  Procedure Laterality Date  . Tonsillectomy and adenoidectomy    . Bone marrow transplant    . Flexible sigmoidoscopy  11/17/2012    Procedure: FLEXIBLE SIGMOIDOSCOPY;  Surgeon: Jeryl Columbia, MD;  Location: [facility];  Service: Endoscopy;  Laterality:  N/A;  Dr Watt Climes states will be admitted to rooom 1339 11/16/12  . Esophagogastroduodenoscopy  11/17/2012    Procedure: ESOPHAGOGASTRODUODENOSCOPY (EGD);  Surgeon: Jeryl Columbia, MD;  Location: [facility];  Service: Endoscopy;  Laterality: N/A;    FAMILY HISTORY Family History  Problem Relation Age of Onset  . Cancer Father   The patient's father died from complications of chronic lymphocytic leukemia at the age of 31.  It had been diagnosed seven years before when he was 40.  The patient is enrolled in a familial chronic lymphocytic leukemia study out of the [employer].  The patient's mother is 46, alive, unfortunately suffering with dementia, and he has a brother, 58, who is otherwise in fair health.   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  His wife of >40 years, [name], is a homemaker.  Their daughter, [name], lives in [place].  She also is a Agricultural engineer.  The patient has an 37 year old grandson and an 57-year-old granddaughter, and that is really the main reason he moved to this area.  He is a Tourist information centre manager.     ADVANCED DIRECTIVES: In place  HEALTH MAINTENANCE: (Updated 04/13/2014) History  Substance Use Topics  . Smoking status: Never Smoker   . Smokeless tobacco: Never Used  . Alcohol Use: No     Colonoscopy: Nov 2013, Dr. Watt Climes  PSA: Not on file  Bone density:  Feb 2014;  Patient also has known insufficiency and pathologic fractures in addition to his long-standing history of steroid use.  Lipid panel: Jan 2015, elevated    Allergies  Allergen Reactions  . Benadryl [Diphenhydramine Hcl]     "Restless leg syndrome"    Current Outpatient Prescriptions  Medication Sig Dispense Refill  . acyclovir (ZOVIRAX) 400 MG tablet Take 2 tablets  (800 mg total) by mouth 2 (two) times daily.  120 tablet  3  . amoxicillin-clavulanate (AUGMENTIN) 875-125 MG per tablet Take 1 tablet by mouth 4 (four) times daily as needed.  8 tablet  0  . budesonide (ENTOCORT EC) 3 MG 24 hr capsule Take 1 capsule (3 mg total) by mouth 3 (three) times daily.  90 capsule  6  . CARTIA XT 240 MG 24 hr capsule TAKE 1 CAPSULE BY MOUTH DAILY  30 capsule  3  . cholecalciferol (VITAMIN D) 1000 UNITS tablet Take 1,000 Units by mouth every evening.       . cholestyramine (QUESTRAN) 4 G packet Take 1 packet (4 g total) by mouth daily.  30 each  12  . diltiazem (DILACOR XR) 240 MG 24 hr capsule Take 1 capsule (240 mg total) by mouth every evening.  30 capsule  5  . fluconazole (DIFLUCAN) 100 MG tablet Take 1 tablet (100 mg total) by mouth daily.  30 tablet  6  .  furosemide (LASIX) 20 MG tablet Take 1 tablet (20 mg total) by mouth daily.  30 tablet  5  . glucose blood (ONE TOUCH ULTRA TEST) test strip Test before meals and at bedtime.  300 each  0  . HYDROcodone-acetaminophen (NORCO) 7.5-325 MG per tablet Take 1 tablet by mouth every 4 (four) hours as needed for moderate pain.  120 tablet  0  . Insulin Aspart Prot & Aspart (NOVOLOG 70/30 MIX) (70-30) 100 UNIT/ML Pen Inject 10 Units into the skin 2 (two) times daily. 10 units with breakfast, 10 units with dinner      . Insulin Pen Needle (B-D UF III MINI PEN NEEDLES) 31G X 5 MM MISC Use twice daily with insulin as directed  100 each  5  . labetalol (NORMODYNE) 200 MG tablet Take 400 mg by mouth 2 (two) times daily.       Marland Kitchen lidocaine (LIDODERM) 5 % Place 1 patch onto the skin daily. Remove & Discard patch within 12 hours or as directed by MD  30 patch  0  . Lidocaine-Hydrocortisone Ace 3-0.5 % KIT Apply 1 application topically as needed (for pain).  1 each  3  . lidocaine-prilocaine (EMLA) cream Apply 1 application topically as needed. 1-2 hrs before each port access  30 g  6  . lisinopril (PRINIVIL,ZESTRIL) 10 MG tablet Take  10 mg by mouth every evening.       . loratadine (CLARITIN) 10 MG tablet Take 10 mg by mouth daily.      . Multiple Vitamin (MULTIVITAMIN WITH MINERALS) TABS tablet Take 1 tablet by mouth daily.      Marland Kitchen omeprazole (PRILOSEC) 20 MG capsule Take 1 capsule (20 mg total) by mouth daily.  30 capsule  5  . ONETOUCH DELICA LANCETS 58N MISC Test before meals and at bedtime.  300 each  0  . predniSONE (DELTASONE) 5 MG tablet Take 0.5-1 tablets (2.5-5 mg total) by mouth 2 (two) times daily. Takes 43m in the morning and 2.584min the evening.  45 tablet  2  . sertraline (ZOLOFT) 50 MG tablet ALTERNATE TAKING 1 TABLET DAILY AND 2 TABLETS DAILY  90 tablet  5  . sulfamethoxazole-trimethoprim (BACTRIM DS) 800-160 MG per tablet Take 1 tablet by mouth daily.  30 tablet  5  . tacrolimus (PROGRAF) 0.5 MG capsule TAKE 2 capsules in the morning and 2 in the evening      . vancomycin (VANCOCIN) 125 MG capsule       . VOLTAREN 1 % GEL Apply 2 g topically 2 (two) times daily. Applied to back      . [DISCONTINUED] insulin aspart (NOVOLOG FLEXPEN) 100 UNIT/ML SOPN FlexPen 18units sq qam, 9units sq qpm, or as directed  15 mL  1   No current facility-administered medications for this visit.   Facility-Administered Medications Ordered in Other Visits  Medication Dose Route Frequency Provider Last Rate Last Dose  . 0.9 %  sodium chloride infusion   Intravenous Continuous Amy G Berry, PA-C 500 mL/hr at 03/12/13 0900    . sodium chloride 0.9 % injection 10 mL  10 mL Intravenous PRN GuChauncey CruelMD   10 mL at 08/11/12 1606    OBJECTIVE: Middle-aged white man who appears stated age Fi19itals:   09/07/14 1135  BP: 125/70  Pulse: 78  Temp: 98 F (36.7 C)  Resp: 18  Body mass index is 33.99 kg/(m^2).  ECOG: 1 Filed Weights   09/07/14 1135  Weight: 210  lb 8 oz (95.482 kg)   Skin: There is a minimally palpable rash in the upper anterior chest, in a "bib"-like distribution; the rest of the skin is entirely normal  except of course for the areas where he has sun damaged Oropharynx is clear and moist HEENT: Right conjunctival bleed; pupils round and equal; EOMs intact Lymph Nodes: No cervical or supraclavicular lymphadenopathy  Lungs: No rash or rhonchi Heart: regular rate and rhythm  Abdomen: Obese, soft, non tender, positive bowel sounds  Musculoskeletal: Kyphosis but no focal spinal tenderness; grade 1 stable bilateral ankle edema Neuro: non focal, well oriented, positive affect   LABS:  CBC    Component Value Date/Time   WBC 4.6 08/31/2014 1210   WBC 7.1 04/06/2014 0949   RBC 4.32 08/31/2014 1210   RBC 4.06* 04/06/2014 0949   RBC 3.82* 03/16/2013 1400   HGB 12.9* 08/31/2014 1210   HGB 12.2* 04/06/2014 0949   HCT 40.1 08/31/2014 1210   HCT 38.5* 04/06/2014 0949   PLT 110* 08/31/2014 1210   PLT 144* 04/06/2014 0949   MCV 92.9 08/31/2014 1210   MCV 94.8 04/06/2014 0949   MCH 29.9 08/31/2014 1210   MCH 30.0 04/06/2014 0949   MCHC 32.2 08/31/2014 1210   MCHC 31.7 04/06/2014 0949   RDW 15.4* 08/31/2014 1210   RDW 15.7* 04/06/2014 0949   LYMPHSABS 1.7 08/31/2014 1210   LYMPHSABS 1.4 03/18/2013 0615   MONOABS 0.4 08/31/2014 1210   MONOABS 0.3 03/18/2013 0615   EOSABS 0.0 08/31/2014 1210   EOSABS 0.0 03/18/2013 0615   BASOSABS 0.0 08/31/2014 1210   BASOSABS 0.0 03/18/2013 0615       Chemistry      Component Value Date/Time   NA 136 08/31/2014 1211   NA 131* 06/14/2013 0800   K 5.5* 08/31/2014 1211   K 4.7 06/14/2013 0800   CL 101 06/15/2013 1034   CL 97 06/14/2013 0800   CO2 14* 08/31/2014 1211   CO2 24 06/14/2013 0800   BUN 39.5* 08/31/2014 1211   BUN 38* 06/14/2013 0800   CREATININE 1.3 08/31/2014 1211   CREATININE 0.97 06/14/2013 0800      Component Value Date/Time   CALCIUM 9.4 08/31/2014 1211   CALCIUM 9.1 06/14/2013 0800   ALKPHOS 69 08/31/2014 1211   ALKPHOS 75 04/18/2013 1218   AST 28 08/31/2014 1211   AST 24 04/18/2013 1218   ALT 36 08/31/2014 1211   ALT 33 04/18/2013 1218   BILITOT 0.29 08/31/2014  1211   BILITOT 0.2* 04/18/2013 1218     STUDIES: Mr Thoracic Spine W Wo Contrast  08/09/2014   CLINICAL DATA:  Mid to low back pain for 3 weeks. History of chronic lymphocytic leukemia.  EXAM: MRI THORACIC AND LUMBAR SPINE WITHOUT AND WITH CONTRAST  TECHNIQUE: Multiplanar and multiecho pulse sequences of the thoracic and lumbar spine were obtained without and with intravenous contrast.  CONTRAST:  44m MULTIHANCE GADOBENATE DIMEGLUMINE 529 MG/ML IV SOLN  COMPARISON:  CT chest, abdomen, and pelvis 02/21/2014  FINDINGS: MR THORACIC SPINE FINDINGS  Vertebral alignment is normal. Numerous Schmorl's nodes are again seen throughout the thoracic spine. Mild T12 compression fracture demonstrates unchanged height loss compared to 08/17/2013 with minimal marrow edema and enhancement subjacent to the superior endplate left of midline. Minimal anterior wedging of multiple other mid to lower thoracic vertebral bodies is unchanged. No acute compression fracture is identified. Vertebral bone marrow signal is mildly heterogeneous diffusely with scattered areas of fatty degenerative marrow changes, most  prominently T6-7. Small central disc protrusions are present at T5-6 and T7-8 without spinal stenosis. There is a moderate central disc protrusion at T8-9 which contacts and mildly deforms the spinal cord without spinal canal stenosis. Small to moderate central disc protrusion is also present at T12-L1 without cord contact or deformation. The thoracic spinal cord is normal in signal. Paraspinal soft tissues are unremarkable.  MR LUMBAR SPINE FINDINGS  Vertebral alignment is normal. Mild L3 and L4 compression fractures are again seen status post cement augmentation without evidence of significant progressive height loss. Mild L5 compression fracture is similar to prior CT and is without marrow edema. Minimal L2 superior endplate depression is also unchanged without marrow edema. Multilevel degenerative marrow changes are present.  Multiple Schmorl's nodes are again seen. Conus medullaris is normal in signal and terminates at L2. Heterogeneous marrow signal is partially visualized in the right sacral ala corresponding to heterogeneous sclerosis and insufficiency fracture described on prior CT.  L1-2:  Negative.  L2-3: Circumferential disc bulging, minimal posterior displacement of the L3 superior endplate, and mild facet and ligamentum flavum hypertrophy result in mild spinal stenosis without neural foraminal stenosis.  L3-4: Mild disc bulging and mild facet and ligamentum flavum hypertrophy without spinal canal or neural foraminal stenosis.  L4-5: Mild-to-moderate circumferential disc bulging and moderate facet and ligamentum flavum hypertrophy result in mild to moderate spinal stenosis and mild right greater than left neural foraminal stenosis.  L5-S1: Mild disc bulge and moderate facet and ligamentum flavum hypertrophy result in mild spinal stenosis without significant neural foraminal stenosis.  IMPRESSION: 1. Multiple mild compression fractures in the thoracic and lumbar spine without significant interval change in degree of height loss compared to prior CTs. Mild residual marrow edema is noted at T12. 2. Multiple small thoracic disc protrusions, most notable at T8-9 where the disc contacts and mildly deforms the spinal cord without stenosis. 3. Moderate lumbar spondylosis, greatest at L4-5 where there is mild-to-moderate spinal stenosis.   Electronically Signed   By: Logan Bores   On: 08/09/2014 10:34   Mr Lumbar Spine W Wo Contrast  08/09/2014   CLINICAL DATA:  Mid to low back pain for 3 weeks. History of chronic lymphocytic leukemia.  EXAM: MRI THORACIC AND LUMBAR SPINE WITHOUT AND WITH CONTRAST  TECHNIQUE: Multiplanar and multiecho pulse sequences of the thoracic and lumbar spine were obtained without and with intravenous contrast.  CONTRAST:  97m MULTIHANCE GADOBENATE DIMEGLUMINE 529 MG/ML IV SOLN  COMPARISON:  CT chest,  abdomen, and pelvis 02/21/2014  FINDINGS: MR THORACIC SPINE FINDINGS  Vertebral alignment is normal. Numerous Schmorl's nodes are again seen throughout the thoracic spine. Mild T12 compression fracture demonstrates unchanged height loss compared to 08/17/2013 with minimal marrow edema and enhancement subjacent to the superior endplate left of midline. Minimal anterior wedging of multiple other mid to lower thoracic vertebral bodies is unchanged. No acute compression fracture is identified. Vertebral bone marrow signal is mildly heterogeneous diffusely with scattered areas of fatty degenerative marrow changes, most prominently T6-7. Small central disc protrusions are present at T5-6 and T7-8 without spinal stenosis. There is a moderate central disc protrusion at T8-9 which contacts and mildly deforms the spinal cord without spinal canal stenosis. Small to moderate central disc protrusion is also present at T12-L1 without cord contact or deformation. The thoracic spinal cord is normal in signal. Paraspinal soft tissues are unremarkable.  MR LUMBAR SPINE FINDINGS  Vertebral alignment is normal. Mild L3 and L4 compression fractures are again seen status  post cement augmentation without evidence of significant progressive height loss. Mild L5 compression fracture is similar to prior CT and is without marrow edema. Minimal L2 superior endplate depression is also unchanged without marrow edema. Multilevel degenerative marrow changes are present. Multiple Schmorl's nodes are again seen. Conus medullaris is normal in signal and terminates at L2. Heterogeneous marrow signal is partially visualized in the right sacral ala corresponding to heterogeneous sclerosis and insufficiency fracture described on prior CT.  L1-2:  Negative.  L2-3: Circumferential disc bulging, minimal posterior displacement of the L3 superior endplate, and mild facet and ligamentum flavum hypertrophy result in mild spinal stenosis without neural foraminal  stenosis.  L3-4: Mild disc bulging and mild facet and ligamentum flavum hypertrophy without spinal canal or neural foraminal stenosis.  L4-5: Mild-to-moderate circumferential disc bulging and moderate facet and ligamentum flavum hypertrophy result in mild to moderate spinal stenosis and mild right greater than left neural foraminal stenosis.  L5-S1: Mild disc bulge and moderate facet and ligamentum flavum hypertrophy result in mild spinal stenosis without significant neural foraminal stenosis.  IMPRESSION: 1. Multiple mild compression fractures in the thoracic and lumbar spine without significant interval change in degree of height loss compared to prior CTs. Mild residual marrow edema is noted at T12. 2. Multiple small thoracic disc protrusions, most notable at T8-9 where the disc contacts and mildly deforms the spinal cord without stenosis. 3. Moderate lumbar spondylosis, greatest at L4-5 where there is mild-to-moderate spinal stenosis.   Electronically Signed   By: Logan Bores   On: 08/09/2014 10:34   ASSESSMENT: 68 y.o. [place] man with a history of well-differentiated lymphocytic lymphoma/ chronic lymphoid leukemia initially diagnosed in 2000, not requiring intervention until 2006; with multiple chromosomal abnormalities.  His treatment history is as follows:  (1) fludarabine/cyclophosphamide/rituximab x5 completed May 2007.   (2) rituximab for 8 doses October 2010, with partial response   (3) Leustatin and ofatumumab weekly x8 July to September 2011 followed by maintenance ofatumumab  every 2 months, with initial response but rising counts September 2012   (4) status-post unrelated donor stem-cell transplant 02/24/2012 at the [facility]  (a) conditioning regimen consisted of fludarabine + TBI at 200 cGy, followed by rituximab x27;  (b) CMV reactivation x3 (patient CMV positive, donor negative), s/p ganciclovir treatment; 3d reactivation August 2013, s/p gancyclovir, with negative PCR  mid-September 2013; last gancyclovir dose 10/06/2012 (c) Chronic GVHD: involving gut and skin, treated with steroids, tacrolimus and MMF.  MMF was eventually d/c'd and tacrolimus currently at a dose of 1.71m BID (d) atrial fibrillation: resolved on brief amiodarone regimen (e) steroid-induced myopathy: improving  (f) hypomagnesemia: improved after d/c gancyclovir, needs continuing support (g) hypogammaglobulinemia: requiring IVIG most recently 08/03/2014. Results for MZACHAREY, JENSEN(MRN [id number] as of 09/07/2014 12:14  Ref. Range 08/03/2014 09:13 08/10/2014 10:29 08/17/2014 11:49 08/24/2014 13:10 08/31/2014 12:59  Magnesium Latest Range: 1.5-2.5 mg/dl 1.6 1.6 1.7 1.7 1.5   (h) history of elevated triglycerides (606 on 07/14/2012)  (i) adrenal insufficiency: on prednisone and budesonide (j) pancytopenia,resolved (k) brief episode of neutropenia (ANC 300) February 2015, accompanied by diarrhea; resolved   (5) restaging studies February-March 2015 including CT scans, flow cytometry, and bone marrow biopsy, showed no evidence of residual chronic lymphoid leukemia.  (6) recurrent GVHD (skin rash, mouth changes, severe diarrhea and gastric/duodenal/colonic biopsies 11/17/2012 c/w GVHD grade 2) : now grade 1 to inactive Results for MALVA, BROXSON(MRN [id number] as of 09/07/2014 12:14  Ref. Range 11/26/2012 04:30 12/13/2012 16:25 12/20/2012 05:00  12/27/2012 04:40 03/16/2013 14:10  Tacrolimus (FK506) - [facility] No range found 5.6 6.1 ng/mL 10.0 21.2 6.1   (7)  malnutrition -- on VITAL supplement in addition to regular diet; on Marinol for anorexia  (8) testosterone deficiency--on patch   (9) deconditioning: Especially quad weakness; continuing rehabilitation exercises  (10) mild dehydration: encouraged increased po fluids; receives IVF support w magnesium weekly  (11) severe steroid-induced osteoporosis with compression fractures: received pamidronate 12/18/2012. Status post kyphoplasty at L3-4 in June  2014. Also with evidence of rib fractures and insufficiency fractures bilaterally of the sacral  alae, noted by CT in March 2015. --   Denosumab started 12/08/2013, given as prolia Q6 months which is what has been approved by his insurance, most recent dose 03/30/2014  (12) chronic back pain and hip pain controlled with OxyContin and hydrocodone/APAP.  (12) nausea: well controlled on current meds  (13)  Positive c.diff, 03/08/2013, on Flagyl 500 mg TID x 20 days, then on oral vanco with Questran, showing improvement; positive when repeated April 2014; Negative x 3   (14) persistently increased BUN and potassium  (15)  Hypertension, on labetalol, cardizem, lisinopril, and furosemide; managed by Dr. Brigitte Pulse  (16) steroid induced hyperglycemia, on sterlix and 70/30 insulin, followed by Dr. Loanne Drilling and Dr. Brigitte Pulse  (17) hypogammaglobulinemia-- requiring intermittent supplementation Results for PARV, MANTHEY (MRN [id number]) as of 09/07/2014 12:14  Ref. Range 06/01/2014 13:07 06/22/2014 12:01 07/13/2014 10:39 08/03/2014 09:14 08/24/2014 13:08  IgG (Immunoglobin G), Serum Latest Range: [id number] mg/dL 769 460 (L) 339 (L) 732 1080   (18) squamous cell CA in situ removed from left parietal scalp October 2014, second lesion to be removed may 2015   PLAN: Ashdon is doing well as far as his graft-versus-host disease is concerned. I don't know exactly why he has a "bad a rash", but the rest of his skin looks absolutely fine and there is nothing in his mouth. He is having occasional loose bowel movements, but not be uncontrolled diarrhea she had when he had active graft-versus-host disease. Accordingly I am making no changes in his treatment and specifically I am not further dropping his prednisone at this point. I am refilling his tacrolimus at the same dose.  I am sure of the reason for his scleral bleed on the right is rubbing his eyes because of allergies. He is a ready and Claritin, which is the right  treatment.  MRIs of the spine showed multiple but stable compression fractures. He currently has no pain. Accordingly we are not proceeding to further kyphoplasty's. I have encouraged him to continue his exercise program and of course she will receive poorly I again in 09/21/2014.  We are going to continue magnesium replacement in 500 cc of normal saline every Thursday for the next several months. He will see [name] every 3 weeks instead of every 2 weeks. We will continue to follow his lab work. We will continue to monitor for graft-versus-host disease.  He is scheduled to return to [facility] in February of 2016.  Mayer knows to call for any problems that may develop before his next visit here.  Marland Kitchen Chauncey Cruel, MD 09/07/2014

## 2014-09-07 NOTE — Telephone Encounter (Signed)
, °

## 2014-09-07 NOTE — Telephone Encounter (Signed)
Per staff message and POF I have scheduled appts. Advised scheduler of appts.advised scheduler on conflict on 33/3  MW

## 2014-09-08 ENCOUNTER — Telehealth: Payer: Self-pay | Admitting: *Deleted

## 2014-09-08 NOTE — Telephone Encounter (Signed)
Per staff message and POF I have scheduled appts. Advised scheduler of appts. JMW  

## 2014-09-13 ENCOUNTER — Other Ambulatory Visit: Payer: Self-pay

## 2014-09-13 DIAGNOSIS — C9110 Chronic lymphocytic leukemia of B-cell type not having achieved remission: Secondary | ICD-10-CM

## 2014-09-13 DIAGNOSIS — D89811 Chronic graft-versus-host disease: Secondary | ICD-10-CM

## 2014-09-13 DIAGNOSIS — T8609 Other complications of bone marrow transplant: Secondary | ICD-10-CM

## 2014-09-13 MED ORDER — ACYCLOVIR 400 MG PO TABS: 800.0000 mg | ORAL_TABLET | Freq: Two times a day (BID) | ORAL | Status: DC

## 2014-09-14 ENCOUNTER — Other Ambulatory Visit (HOSPITAL_BASED_OUTPATIENT_CLINIC_OR_DEPARTMENT_OTHER): Payer: BC Managed Care – PPO

## 2014-09-14 ENCOUNTER — Ambulatory Visit (HOSPITAL_BASED_OUTPATIENT_CLINIC_OR_DEPARTMENT_OTHER): Payer: BC Managed Care – PPO

## 2014-09-14 DIAGNOSIS — D809 Immunodeficiency with predominantly antibody defects, unspecified: Secondary | ICD-10-CM

## 2014-09-14 DIAGNOSIS — C9110 Chronic lymphocytic leukemia of B-cell type not having achieved remission: Secondary | ICD-10-CM

## 2014-09-14 LAB — COMPREHENSIVE METABOLIC PANEL (CC13)
ALT: 38 U/L (ref 0–55)
AST: 26 U/L (ref 5–34)
Albumin: 3.6 g/dL (ref 3.5–5.0)
Alkaline Phosphatase: 79 U/L (ref 40–150)
Anion Gap: 9 mEq/L (ref 3–11)
BUN: 39.6 mg/dL — ABNORMAL HIGH (ref 7.0–26.0)
CO2: 16 mEq/L — ABNORMAL LOW (ref 22–29)
Calcium: 9.6 mg/dL (ref 8.4–10.4)
Chloride: 114 mEq/L — ABNORMAL HIGH (ref 98–109)
Creatinine: 1.4 mg/dL — ABNORMAL HIGH (ref 0.7–1.3)
Glucose: 181 mg/dl — ABNORMAL HIGH (ref 70–140)
Potassium: 4.8 mEq/L (ref 3.5–5.1)
Sodium: 139 mEq/L (ref 136–145)
Total Bilirubin: 0.33 mg/dL (ref 0.20–1.20)
Total Protein: 6.2 g/dL — ABNORMAL LOW (ref 6.4–8.3)

## 2014-09-14 LAB — CBC WITH DIFFERENTIAL/PLATELET
BASO%: 0.2 % (ref 0.0–2.0)
Basophils Absolute: 0 10*3/uL (ref 0.0–0.1)
EOS%: 0.8 % (ref 0.0–7.0)
Eosinophils Absolute: 0 10*3/uL (ref 0.0–0.5)
HCT: 39.5 % (ref 38.4–49.9)
HGB: 13.1 g/dL (ref 13.0–17.1)
LYMPH%: 29.3 % (ref 14.0–49.0)
MCH: 30.5 pg (ref 27.2–33.4)
MCHC: 33.2 g/dL (ref 32.0–36.0)
MCV: 92.1 fL (ref 79.3–98.0)
MONO#: 0.6 10*3/uL (ref 0.1–0.9)
MONO%: 10.9 % (ref 0.0–14.0)
NEUT#: 3 10*3/uL (ref 1.5–6.5)
NEUT%: 58.8 % (ref 39.0–75.0)
Platelets: 107 10*3/uL — ABNORMAL LOW (ref 140–400)
RBC: 4.29 10*6/uL (ref 4.20–5.82)
RDW: 15.3 % — ABNORMAL HIGH (ref 11.0–14.6)
WBC: 5.1 10*3/uL (ref 4.0–10.3)
lymph#: 1.5 10*3/uL (ref 0.9–3.3)

## 2014-09-14 MED ORDER — MAGNESIUM SULFATE 50 % IJ SOLN
1.0000 g | Freq: Once | INTRAMUSCULAR | Status: AC
  Administered 2014-09-14: 1 g via INTRAVENOUS
  Filled 2014-09-14: qty 2

## 2014-09-14 MED ORDER — SODIUM CHLORIDE 0.9 % IJ SOLN
10.0000 mL | Freq: Once | INTRAMUSCULAR | Status: AC
Start: 2014-09-14 — End: 2014-09-14
  Administered 2014-09-14: 10 mL via INTRAVENOUS
  Filled 2014-09-14: qty 10

## 2014-09-14 MED ORDER — HEPARIN SOD (PORK) LOCK FLUSH 100 UNIT/ML IV SOLN
500.0000 [IU] | Freq: Once | INTRAVENOUS | Status: AC
  Administered 2014-09-14: 500 [IU] via INTRAVENOUS
  Filled 2014-09-14: qty 5

## 2014-09-14 NOTE — Patient Instructions (Signed)
Hypomagnesemia Magnesium is a common ion (mineral) in the body which is needed for metabolism. It is about how the body handles food and other chemical reactions necessary for life. Only about 2% of the magnesium in our body is found in the blood. When this is low, it is called hypomagnesemia. The blood will measure only a tiny amount of the magnesium in our body. When it is low in our blood, it does not mean that the whole body supply is low. The normal serum concentration ranges from 1.8-2.5 mEq/L. When the level gets to be less than 1.0 mEq/L, a number of problems begin to happen.  CAUSES   Receiving intravenous fluids without magnesium replacement.  Loss of magnesium from the bowel by nasogastric suction.  Loss of magnesium from nausea and vomiting or severe diarrhea. Any of the inflammatory bowel conditions can cause this.  Abuse of alcohol often leads to low serum magnesium.  An inherited form of magnesium loss happens when the kidneys lose magnesium. This is called familial or primary hypomagnesemia.  Some medications such as diuretics also cause the loss of magnesium. SYMPTOMS  These following problems are worse if the changes in magnesium levels come on suddenly.  Tremor.  Confusion.  Muscle weakness.  Oversensitive to sights and sounds.  Sensitive reflexes.  Depression.  Muscular fibrillations.  Overreactivity of the nerves.  Irritability.  Psychosis.  Spasms of the hand muscles.  Tetany (where the muscles go into uncontrollable spasms). DIAGNOSIS  This condition can be diagnosed by blood tests. TREATMENT   In an emergency, magnesium can be given intravenously (by vein).  If the condition is less worrisome, it can be corrected by diet. High levels of magnesium are found in green leafy vegetables, peas, beans, and nuts among other things. It can also be given through medications by mouth.  If it is being caused by medications, changes can be made.  If  alcohol is a problem, help is available if there are difficulties giving it up. Document Released: 09/03/2005 Document Revised: 04/24/2014 Document Reviewed: 07/28/2008 [employer] Patient Information 2015 [employer]. This information is not intended to replace advice given to you by your health care provider. Make sure you discuss any questions you have with your health care provider.  

## 2014-09-15 LAB — IGG, IGA, IGM
IgA: <6 mg/dL — ABNORMAL LOW (ref 68–379)
IgG (Immunoglobin G), Serum: 587 mg/dL — ABNORMAL LOW (ref 650–1600)
IgM, Serum: <5 mg/dL — ABNORMAL LOW (ref 41–251)

## 2014-09-20 ENCOUNTER — Other Ambulatory Visit: Payer: Self-pay | Admitting: *Deleted

## 2014-09-21 ENCOUNTER — Other Ambulatory Visit (HOSPITAL_BASED_OUTPATIENT_CLINIC_OR_DEPARTMENT_OTHER): Payer: BC Managed Care – PPO

## 2014-09-21 ENCOUNTER — Other Ambulatory Visit: Payer: Self-pay | Admitting: Oncology

## 2014-09-21 ENCOUNTER — Other Ambulatory Visit: Payer: Self-pay | Admitting: *Deleted

## 2014-09-21 ENCOUNTER — Ambulatory Visit: Payer: Self-pay | Admitting: Nurse Practitioner

## 2014-09-21 ENCOUNTER — Ambulatory Visit (HOSPITAL_BASED_OUTPATIENT_CLINIC_OR_DEPARTMENT_OTHER): Payer: BC Managed Care – PPO

## 2014-09-21 DIAGNOSIS — C9110 Chronic lymphocytic leukemia of B-cell type not having achieved remission: Secondary | ICD-10-CM

## 2014-09-21 DIAGNOSIS — M810 Age-related osteoporosis without current pathological fracture: Secondary | ICD-10-CM

## 2014-09-21 LAB — CBC WITH DIFFERENTIAL/PLATELET
BASO%: 0.4 % (ref 0.0–2.0)
BASOS ABS: 0 10*3/uL (ref 0.0–0.1)
EOS%: 0.4 % (ref 0.0–7.0)
Eosinophils Absolute: 0 10*3/uL (ref 0.0–0.5)
HEMATOCRIT: 39.5 % (ref 38.4–49.9)
HEMOGLOBIN: 12.9 g/dL — AB (ref 13.0–17.1)
LYMPH#: 1.5 10*3/uL (ref 0.9–3.3)
LYMPH%: 30.6 % (ref 14.0–49.0)
MCH: 30.2 pg (ref 27.2–33.4)
MCHC: 32.5 g/dL (ref 32.0–36.0)
MCV: 92.7 fL (ref 79.3–98.0)
MONO#: 0.4 10*3/uL (ref 0.1–0.9)
MONO%: 7.2 % (ref 0.0–14.0)
NEUT#: 3 10*3/uL (ref 1.5–6.5)
NEUT%: 61.4 % (ref 39.0–75.0)
PLATELETS: 134 10*3/uL — AB (ref 140–400)
RBC: 4.26 10*6/uL (ref 4.20–5.82)
RDW: 15.3 % — ABNORMAL HIGH (ref 11.0–14.6)
WBC: 4.9 10*3/uL (ref 4.0–10.3)

## 2014-09-21 LAB — COMPREHENSIVE METABOLIC PANEL (CC13)
ALK PHOS: 82 U/L (ref 40–150)
ALT: 50 U/L (ref 0–55)
AST: 36 U/L — AB (ref 5–34)
Albumin: 3.4 g/dL — ABNORMAL LOW (ref 3.5–5.0)
Anion Gap: 7 mEq/L (ref 3–11)
BILIRUBIN TOTAL: 0.3 mg/dL (ref 0.20–1.20)
BUN: 43.3 mg/dL — ABNORMAL HIGH (ref 7.0–26.0)
CO2: 17 mEq/L — ABNORMAL LOW (ref 22–29)
CREATININE: 1.3 mg/dL (ref 0.7–1.3)
Calcium: 9.1 mg/dL (ref 8.4–10.4)
Chloride: 115 mEq/L — ABNORMAL HIGH (ref 98–109)
GLUCOSE: 169 mg/dL — AB (ref 70–140)
POTASSIUM: 4.5 meq/L (ref 3.5–5.1)
Sodium: 139 mEq/L (ref 136–145)
Total Protein: 6 g/dL — ABNORMAL LOW (ref 6.4–8.3)

## 2014-09-21 LAB — MAGNESIUM (CC13): Magnesium: 1.7 mg/dl (ref 1.5–2.5)

## 2014-09-21 MED ORDER — DENOSUMAB 60 MG/ML ~~LOC~~ SOLN
60.0000 mg | Freq: Once | SUBCUTANEOUS | Status: AC
  Administered 2014-09-21: 60 mg via SUBCUTANEOUS
  Filled 2014-09-21: qty 1

## 2014-09-21 MED ORDER — SODIUM CHLORIDE 0.9 % IJ SOLN
10.0000 mL | INTRAMUSCULAR | Status: DC | PRN
  Administered 2014-09-21: 10 mL via INTRAVENOUS
  Filled 2014-09-21: qty 10

## 2014-09-21 MED ORDER — SODIUM CHLORIDE 0.9 % IV SOLN
1.0000 g | Freq: Once | INTRAVENOUS | Status: AC
  Administered 2014-09-21: 1 g via INTRAVENOUS
  Filled 2014-09-21: qty 2

## 2014-09-21 MED ORDER — FLUCONAZOLE 100 MG PO TABS: 100.0000 mg | ORAL_TABLET | Freq: Once | ORAL | Status: DC

## 2014-09-21 MED ORDER — SODIUM CHLORIDE 0.9 % IV SOLN
INTRAVENOUS | Status: DC
  Administered 2014-09-21: 14:00:00 via INTRAVENOUS

## 2014-09-21 MED ORDER — HEPARIN SOD (PORK) LOCK FLUSH 100 UNIT/ML IV SOLN
500.0000 [IU] | Freq: Once | INTRAVENOUS | Status: AC
  Administered 2014-09-21: 500 [IU] via INTRAVENOUS
  Filled 2014-09-21: qty 5

## 2014-09-21 NOTE — Patient Instructions (Signed)
Magnesium Sulfate injection °What is this medicine? °MAGNESIUM SULFATE (mag NEE zee um SUL fate) is an electrolyte injection commonly used to treat low magnesium levels in your blood and to prevent or control certain seizures. °This medicine may be used for other purposes; ask your health care provider or pharmacist if you have questions. °What should I tell my health care provider before I take this medicine? °They need to know if you have any of these conditions: °-heart disease °-history of irregular heart beat °-kidney disease °-an unusual or allergic reaction to magnesium sulfate, medicines, foods, dyes, or preservatives °-pregnant or trying to get pregnant °-breast-feeding °How should I use this medicine? °This medicine is for infusion into a vein. It is given by a health care professional in a hospital or clinic setting. °Talk to your pediatrician regarding the use of this medicine in children. While this drug may be prescribed for selected conditions, precautions do apply. °Overdosage: If you think you have taken too much of this medicine contact a poison control center or emergency room at once. °NOTE: This medicine is only for you. Do not share this medicine with others. °What if I miss a dose? °This does not apply. °What may interact with this medicine? °This medicine may interact with the following medications: °-certain medicines for anxiety or sleep °-certain medicines for seizures like phenobarbital °-digoxin °-medicines that relax muscles for surgery °-narcotic medicines for pain °This list may not describe all possible interactions. Give your health care provider a list of all the medicines, herbs, non-prescription drugs, or dietary supplements you use. Also tell them if you smoke, drink alcohol, or use illegal drugs. Some items may interact with your medicine. °What should I watch for while using this medicine? °Your condition will be monitored carefully while you are receiving this medicine. You  may need blood work done while you are receiving this medicine. °What side effects may I notice from receiving this medicine? °Side effects that you should report to your doctor or health care professional as soon as possible: °-allergic reactions like skin rash, itching or hives, swelling of the face, lips, or tongue °-facial flushing °-muscle weakness °-signs and symptoms of low blood pressure like dizziness; feeling faint or lightheaded, falls; unusually weak or tired °-signs and symptoms of a dangerous change in heartbeat or heart rhythm like chest pain; dizziness; fast or irregular heartbeat; palpitations; breathing problems °-sweating °This list may not describe all possible side effects. Call your doctor for medical advice about side effects. You may report side effects to FDA at 1-800-FDA-1088. °Where should I keep my medicine? °This drug is given in a hospital or clinic and will not be stored at home. °NOTE: This sheet is a summary. It may not cover all possible information. If you have questions about this medicine, talk to your doctor, pharmacist, or health care provider. °© 2015, Elsevier/Gold Standard. (2013-04-18 10:35:11) ° °

## 2014-09-28 ENCOUNTER — Encounter: Payer: Self-pay | Admitting: Adult Health

## 2014-09-28 ENCOUNTER — Ambulatory Visit: Payer: Self-pay | Admitting: Nurse Practitioner

## 2014-09-28 ENCOUNTER — Other Ambulatory Visit: Payer: Self-pay

## 2014-09-28 ENCOUNTER — Other Ambulatory Visit (HOSPITAL_BASED_OUTPATIENT_CLINIC_OR_DEPARTMENT_OTHER): Payer: BC Managed Care – PPO

## 2014-09-28 ENCOUNTER — Ambulatory Visit (HOSPITAL_BASED_OUTPATIENT_CLINIC_OR_DEPARTMENT_OTHER): Payer: BC Managed Care – PPO

## 2014-09-28 ENCOUNTER — Ambulatory Visit (HOSPITAL_BASED_OUTPATIENT_CLINIC_OR_DEPARTMENT_OTHER): Payer: BC Managed Care – PPO | Admitting: Adult Health

## 2014-09-28 VITALS — BP 124/68 | HR 70 | Temp 98.5°F | Resp 20 | Ht 66.0 in | Wt 216.1 lb

## 2014-09-28 DIAGNOSIS — N189 Chronic kidney disease, unspecified: Secondary | ICD-10-CM

## 2014-09-28 DIAGNOSIS — Z9484 Stem cells transplant status: Secondary | ICD-10-CM

## 2014-09-28 DIAGNOSIS — C9110 Chronic lymphocytic leukemia of B-cell type not having achieved remission: Secondary | ICD-10-CM

## 2014-09-28 DIAGNOSIS — D89811 Chronic graft-versus-host disease: Secondary | ICD-10-CM

## 2014-09-28 DIAGNOSIS — T8609 Other complications of bone marrow transplant: Principal | ICD-10-CM

## 2014-09-28 LAB — COMPREHENSIVE METABOLIC PANEL (CC13)
ALK PHOS: 80 U/L (ref 40–150)
ALT: 47 U/L (ref 0–55)
AST: 31 U/L (ref 5–34)
Albumin: 3.4 g/dL — ABNORMAL LOW (ref 3.5–5.0)
Anion Gap: 6 mEq/L (ref 3–11)
BILIRUBIN TOTAL: 0.31 mg/dL (ref 0.20–1.20)
BUN: 39 mg/dL — ABNORMAL HIGH (ref 7.0–26.0)
CO2: 20 meq/L — AB (ref 22–29)
Calcium: 9 mg/dL (ref 8.4–10.4)
Chloride: 114 mEq/L — ABNORMAL HIGH (ref 98–109)
Creatinine: 1.3 mg/dL (ref 0.7–1.3)
Glucose: 171 mg/dl — ABNORMAL HIGH (ref 70–140)
Potassium: 5 mEq/L (ref 3.5–5.1)
Sodium: 140 mEq/L (ref 136–145)
Total Protein: 5.9 g/dL — ABNORMAL LOW (ref 6.4–8.3)

## 2014-09-28 LAB — MAGNESIUM (CC13): MAGNESIUM: 1.6 mg/dL (ref 1.5–2.5)

## 2014-09-28 LAB — CBC WITH DIFFERENTIAL/PLATELET
BASO%: 0.5 % (ref 0.0–2.0)
Basophils Absolute: 0 10*3/uL (ref 0.0–0.1)
EOS%: 0.3 % (ref 0.0–7.0)
Eosinophils Absolute: 0 10*3/uL (ref 0.0–0.5)
HEMATOCRIT: 39.2 % (ref 38.4–49.9)
HGB: 12.7 g/dL — ABNORMAL LOW (ref 13.0–17.1)
LYMPH%: 26.5 % (ref 14.0–49.0)
MCH: 30.3 pg (ref 27.2–33.4)
MCHC: 32.4 g/dL (ref 32.0–36.0)
MCV: 93.5 fL (ref 79.3–98.0)
MONO#: 0.4 10*3/uL (ref 0.1–0.9)
MONO%: 7.5 % (ref 0.0–14.0)
NEUT#: 3.7 10*3/uL (ref 1.5–6.5)
NEUT%: 65.2 % (ref 39.0–75.0)
PLATELETS: 133 10*3/uL — AB (ref 140–400)
RBC: 4.2 10*6/uL (ref 4.20–5.82)
RDW: 15.6 % — ABNORMAL HIGH (ref 11.0–14.6)
WBC: 5.7 10*3/uL (ref 4.0–10.3)
lymph#: 1.5 10*3/uL (ref 0.9–3.3)

## 2014-09-28 MED ORDER — HEPARIN SOD (PORK) LOCK FLUSH 100 UNIT/ML IV SOLN
500.0000 [IU] | Freq: Once | INTRAVENOUS | Status: AC | PRN
  Administered 2014-09-28: 500 [IU]
  Filled 2014-09-28: qty 5

## 2014-09-28 MED ORDER — MAGNESIUM SULFATE 50 % IJ SOLN
1.0000 g | Freq: Once | INTRAMUSCULAR | Status: AC
  Administered 2014-09-28: 1 g via INTRAVENOUS
  Filled 2014-09-28: qty 2

## 2014-09-28 NOTE — Progress Notes (Signed)
ID: Timothy [name]   DOB: January 28, 1946  MR#: [id number]  HYI#:[id number]  NOM:VEHM, Timothy Saxon, MD SU: OTHER MD: Timothy [name]; Timothy Shin, MD; Timothy [name], Timothy Hatcher,MD; Timothy Rosenthal, MD; [facility] in [place], [place]:  Timothy Duval, RN [phone])  CHIEF COMPLAINT:  CLL, status post allogeneic stem cell transplant, GVHD CURRENT TREATMENT: Immunosuppression  HISTORY OF CLL: From the original intake note:  We have very complete records from Dr. Racheal [name] in [place], and in summary:  The patient was initially diagnosed in August 2000, with a white cell count of 23,600, but normal hemoglobin and platelets, and no significant symptomatology. Over the next several years his white cell count drifted up, and he eventually developed some symptoms of night sweats in particular, leading to treatment with fludarabine, Cytoxan and rituxan for five cycles given between December 2006 and May 2007.  We have CT scans from June 2006, November 2006 and April 2007, and comparing the November 2006 and April 2007 scans, there was near complete response. He had subsequent therapy in [place] as detailed below, but with decreased response, leading to allogeneic stem-cell transplant at the [facility] 02/24/2012.  Subsequent history is as detailed below.  INTERVAL HISTORY: Timothy [name] returns today with his wife, Timothy [name], for follow up of his chronic lymphoid leukemia. He is about 3 years out from his matched unrelated donor allogeneic stem cell transplant.  He does still have a rash that is about similar to a necklace around his neck that has been present for some time.  He occasionally has a loose bowel movement (chronic GVHD), about once per week.  He denies fevers, chills, nausea, vomiting, weight loss, night sweats, or any further concerns.   REVIEW OF SYSTEMS: A 10 point review of systems was conducted and is otherwise negative except for what is  noted above.     PAST MEDICAL HISTORY: Past Medical History  Diagnosis Date  . 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 Diverticular disease   . Hyperlipidemia   . Obesity   . Hypertension   . Hiatal hernia   . CMV (cytomegalovirus) antibody positive     pre-transplant, with seroconversion x2 pst-transplant  . Right bundle branch block     pre-transplant  . CKD (chronic kidney disease) stage 2, GFR 60-89 ml/min   . Pancytopenia   . Steroid-induced diabetes   . Atrial fibrillation     post-transplant  . Myopathy   . 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 Leukemia, chronic lymphoid   . 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 Diarrhea in adult patient 12/05/2012    Due to active GVHD  . CLL (chronic lymphocytic leukemia) 12/05/2012    Dx 07/1999; started Rx 12/06  AlloBMT 3/13  . Rash of face 12/05/2012    Due to GVHD  . Hypomagnesemia 01/26/2013  . Left hip pain 12/01/2013    PAST SURGICAL HISTORY: Past Surgical History  Procedure Laterality Date  . Tonsillectomy and adenoidectomy    . Bone marrow transplant    . Flexible sigmoidoscopy  11/17/2012    Procedure: FLEXIBLE SIGMOIDOSCOPY;  Surgeon: Timothy Columbia, MD;  Location: [facility];  Service: Endoscopy;  Laterality: N/A;  Dr Timothy [name] states will be admitted to rooom 1339 11/16/12  . Esophagogastroduodenoscopy  11/17/2012    Procedure: ESOPHAGOGASTRODUODENOSCOPY (EGD);  Surgeon: Timothy Columbia, MD;  Location: [facility];  Service:  Endoscopy;  Laterality: N/A;    FAMILY HISTORY Family History  Problem Relation Age of Onset  . Cancer Father   The patient's father died from complications of chronic lymphocytic leukemia at the age of 100.  It had been diagnosed seven years before when he was 68.  The patient is enrolled in a familial chronic lymphocytic leukemia study out of the [name].  The  patient's mother is 65, alive, unfortunately suffering with dementia, and he has a brother, 69, who is otherwise in fair health.   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  His wife of >40 years, Timothy [name], is a homemaker.  Their daughter, Timothy [name], lives in [place].  She also is a Agricultural engineer.  The patient has an 5 year old grandson and an 70-year-old granddaughter, and that is really the main reason he moved to this area.  He is a Tourist information centre manager.     ADVANCED DIRECTIVES: In place  HEALTH MAINTENANCE: (Updated 04/13/2014) History  Substance Use Topics  . Smoking status: Never Smoker   . Smokeless tobacco: Never Used  . Alcohol Use: No     Colonoscopy: Nov 2013, Dr. Watt [name]  PSA: Not on file  Bone density:  Feb 2014;  Patient also has known insufficiency and pathologic fractures in addition to his long-standing history of steroid use.  Lipid panel: Jan 2015, elevated    Allergies  Allergen Reactions  . Benadryl [Diphenhydramine Hcl]     "Restless leg syndrome"    Current Outpatient Prescriptions  Medication Sig Dispense Refill  . acyclovir (ZOVIRAX) 400 MG tablet Take 2 tablets (800 mg total) by mouth 2 (two) times daily.  120 tablet  3  . budesonide (ENTOCORT EC) 3 MG 24 hr capsule Take 1 capsule (3 mg total) by mouth 3 (three) times daily.  90 capsule  6  . CARTIA XT 240 MG 24 hr capsule TAKE 1 CAPSULE BY MOUTH DAILY  30 capsule  3  . cholestyramine (QUESTRAN) 4 G packet Take 1 packet (4 g total) by mouth daily.  30 each  12  . diltiazem (DILACOR XR) 240 MG 24 hr capsule Take 1 capsule (240 mg total) by mouth every evening.  30 capsule  5  . fluconazole (DIFLUCAN) 100 MG tablet Take 1 tablet (100 mg total) by mouth once.  30 tablet  3  . furosemide (LASIX) 20 MG  tablet Take 1 tablet (20 mg total) by mouth daily.  30 tablet  5  . glucose blood (ONE TOUCH ULTRA TEST) test strip Test before meals and at bedtime.  300 each  0  . HYDROcodone-acetaminophen (NORCO) 7.5-325 MG per tablet Take 1 tablet by mouth every 4 (four) hours as needed for moderate pain.  120 tablet  0  . Insulin Aspart Prot & Aspart (NOVOLOG 70/30 MIX) (70-30) 100 UNIT/ML Pen Inject 10 Units into the skin 2 (two) times daily. 10 units with breakfast, 10 units with dinner      . Insulin Pen Needle (B-D UF III MINI PEN NEEDLES) 31G X 5 MM MISC Use twice daily with insulin as directed  100 each  5  . labetalol (NORMODYNE) 200 MG tablet Take 400 mg by mouth 2 (two) times daily.       Marland Kitchen lidocaine (LIDODERM) 5 % Place 1 patch onto  the skin daily. Remove & Discard patch within 12 hours or as directed by MD  30 patch  0  . Lidocaine-Hydrocortisone Ace 3-0.5 % KIT Apply 1 application topically as needed (for pain).  1 each  3  . lidocaine-prilocaine (EMLA) cream Apply 1 application topically as needed. 1-2 hrs before each port access  30 g  6  . lisinopril (PRINIVIL,ZESTRIL) 10 MG tablet Take 10 mg by mouth every evening.       . loratadine (CLARITIN) 10 MG tablet Take 10 mg by mouth daily.      Marland Kitchen omeprazole (PRILOSEC) 20 MG capsule Take 1 capsule (20 mg total) by mouth daily.  30 capsule  5  . ONETOUCH DELICA LANCETS 13K MISC Test before meals and at bedtime.  300 each  0  . predniSONE (DELTASONE) 5 MG tablet Take 0.5-1 tablets (2.5-5 mg total) by mouth 2 (two) times daily. Takes $RemoveBefo'5mg'uHznjxPcXFg$  in the morning and 2.$RemoveBefor'5mg'dAHbpzZUZyil$  in the evening.  45 tablet  2  . sertraline (ZOLOFT) 50 MG tablet ALTERNATE TAKING 1 TABLET DAILY AND 2 TABLETS DAILY  90 tablet  5  . sulfamethoxazole-trimethoprim (BACTRIM DS) 800-160 MG per tablet Take 1 tablet by mouth daily.  30 tablet  5  . tacrolimus (PROGRAF) 0.5 MG capsule Take 3 capsules (1.5 mg total) by mouth 2 (two) times daily.  180 capsule  4  . vancomycin (VANCOCIN) 125 MG capsule        . VOLTAREN 1 % GEL Apply 2 g topically 2 (two) times daily. Applied to back      . Multiple Vitamin (MULTIVITAMIN WITH MINERALS) TABS tablet Take 1 tablet by mouth daily.      . [DISCONTINUED] insulin aspart (NOVOLOG FLEXPEN) 100 UNIT/ML SOPN FlexPen 18units sq qam, 9units sq qpm, or as directed  15 mL  1   No current facility-administered medications for this visit.   Facility-Administered Medications Ordered in Other Visits  Medication Dose Route Frequency Provider Last Rate Last Dose  . 0.9 %  sodium chloride infusion   Intravenous Continuous Amy G Berry, PA-C 500 mL/hr at 03/12/13 0900    . sodium chloride 0.9 % injection 10 mL  10 mL Intravenous PRN Chauncey Cruel, MD   10 mL at 08/11/12 1606    OBJECTIVE: Middle-aged white man who appears stated age 57 Vitals:   09/28/14 1345  BP: 124/68  Pulse: 70  Temp: 98.5 F (36.9 C)  Resp: 20  Body mass index is 34.9 kg/(m^2).  ECOG: 1 Filed Weights   09/28/14 1345  Weight: 216 lb 1.6 oz (98.022 kg)   GENERAL: Patient is a well appearing male in no acute distress HEENT:  Sclerae anicteric.  Oropharynx clear and moist. No ulcerations or evidence of oropharyngeal candidiasis. Neck is supple.  NODES:  No cervical, supraclavicular, or axillary lymphadenopathy palpated.  LUNGS:  Clear to auscultation bilaterally.  No wheezes or rhonchi. HEART:  Regular rate and rhythm. No murmur appreciated. ABDOMEN:  Soft, nontender.  Positive, normoactive bowel sounds. No organomegaly palpated. MSK:  No focal spinal tenderness to palpation. Full range of motion bilaterally in the upper extremities. EXTREMITIES:  No peripheral edema.   SKIN:  Stable rash around neck and chest area in the shape of a necklace NEURO:  Nonfocal. Well oriented.  Appropriate affect.    LABS:  CBC    Component Value Date/Time   WBC 5.7 09/28/2014 1332   WBC 7.1 04/06/2014 0949   RBC 4.20 09/28/2014 1332   RBC  4.06* 04/06/2014 0949   RBC 3.82* 03/16/2013 1400    HGB 12.7* 09/28/2014 1332   HGB 12.2* 04/06/2014 0949   HCT 39.2 09/28/2014 1332   HCT 38.5* 04/06/2014 0949   PLT 133* 09/28/2014 1332   PLT 144* 04/06/2014 0949   MCV 93.5 09/28/2014 1332   MCV 94.8 04/06/2014 0949   MCH 30.3 09/28/2014 1332   MCH 30.0 04/06/2014 0949   MCHC 32.4 09/28/2014 1332   MCHC 31.7 04/06/2014 0949   RDW 15.6* 09/28/2014 1332   RDW 15.7* 04/06/2014 0949   LYMPHSABS 1.5 09/28/2014 1332   LYMPHSABS 1.4 03/18/2013 0615   MONOABS 0.4 09/28/2014 1332   MONOABS 0.3 03/18/2013 0615   EOSABS 0.0 09/28/2014 1332   EOSABS 0.0 03/18/2013 0615   BASOSABS 0.0 09/28/2014 1332   BASOSABS 0.0 03/18/2013 0615       Chemistry      Component Value Date/Time   NA 140 09/28/2014 1333   NA 139 09/07/2014 1324   K 5.0 09/28/2014 1333   K 4.8 09/07/2014 1324   CL 110 09/07/2014 1324   CL 101 06/15/2013 1034   CO2 20* 09/28/2014 1333   CO2 15* 09/07/2014 1324   BUN 39.0* 09/28/2014 1333   BUN 46* 09/07/2014 1324   CREATININE 1.3 09/28/2014 1333   CREATININE 1.20 09/07/2014 1324      Component Value Date/Time   CALCIUM 9.0 09/28/2014 1333   CALCIUM 9.1 09/07/2014 1324   ALKPHOS 80 09/28/2014 1333   ALKPHOS 80 09/07/2014 1324   AST 31 09/28/2014 1333   AST 40* 09/07/2014 1324   ALT 47 09/28/2014 1333   ALT 55* 09/07/2014 1324   BILITOT 0.31 09/28/2014 1333   BILITOT <0.2* 09/07/2014 1324     STUDIES: Mr Thoracic Spine W Wo Contrast  08/09/2014   CLINICAL DATA:  Mid to low back pain for 3 weeks. History of chronic lymphocytic leukemia.  EXAM: MRI THORACIC AND LUMBAR SPINE WITHOUT AND WITH CONTRAST  TECHNIQUE: Multiplanar and multiecho pulse sequences of the thoracic and lumbar spine were obtained without and with intravenous contrast.  CONTRAST:  17mL MULTIHANCE GADOBENATE DIMEGLUMINE 529 MG/ML IV SOLN  COMPARISON:  CT chest, abdomen, and pelvis 02/21/2014  FINDINGS: MR THORACIC SPINE FINDINGS  Vertebral alignment is normal. Numerous Schmorl's nodes are again seen throughout the thoracic spine. Mild T12  compression fracture demonstrates unchanged height loss compared to 08/17/2013 with minimal marrow edema and enhancement subjacent to the superior endplate left of midline. Minimal anterior wedging of multiple other mid to lower thoracic vertebral bodies is unchanged. No acute compression fracture is identified. Vertebral bone marrow signal is mildly heterogeneous diffusely with scattered areas of fatty degenerative marrow changes, most prominently T6-7. Small central disc protrusions are present at T5-6 and T7-8 without spinal stenosis. There is a moderate central disc protrusion at T8-9 which contacts and mildly deforms the spinal cord without spinal canal stenosis. Small to moderate central disc protrusion is also present at T12-L1 without cord contact or deformation. The thoracic spinal cord is normal in signal. Paraspinal soft tissues are unremarkable.  MR LUMBAR SPINE FINDINGS  Vertebral alignment is normal. Mild L3 and L4 compression fractures are again seen status post cement augmentation without evidence of significant progressive height loss. Mild L5 compression fracture is similar to prior CT and is without marrow edema. Minimal L2 superior endplate depression is also unchanged without marrow edema. Multilevel degenerative marrow changes are present. Multiple Schmorl's nodes are again seen. Conus medullaris is normal in  signal and terminates at L2. Heterogeneous marrow signal is partially visualized in the right sacral ala corresponding to heterogeneous sclerosis and insufficiency fracture described on prior CT.  L1-2:  Negative.  L2-3: Circumferential disc bulging, minimal posterior displacement of the L3 superior endplate, and mild facet and ligamentum flavum hypertrophy result in mild spinal stenosis without neural foraminal stenosis.  L3-4: Mild disc bulging and mild facet and ligamentum flavum hypertrophy without spinal canal or neural foraminal stenosis.  L4-5: Mild-to-moderate circumferential disc  bulging and moderate facet and ligamentum flavum hypertrophy result in mild to moderate spinal stenosis and mild right greater than left neural foraminal stenosis.  L5-S1: Mild disc bulge and moderate facet and ligamentum flavum hypertrophy result in mild spinal stenosis without significant neural foraminal stenosis.  IMPRESSION: 1. Multiple mild compression fractures in the thoracic and lumbar spine without significant interval change in degree of height loss compared to prior CTs. Mild residual marrow edema is noted at T12. 2. Multiple small thoracic disc protrusions, most notable at T8-9 where the disc contacts and mildly deforms the spinal cord without stenosis. 3. Moderate lumbar spondylosis, greatest at L4-5 where there is mild-to-moderate spinal stenosis.   Electronically Signed   By: Logan Bores   On: 08/09/2014 10:34   Mr Lumbar Spine W Wo Contrast  08/09/2014   CLINICAL DATA:  Mid to low back pain for 3 weeks. History of chronic lymphocytic leukemia.  EXAM: MRI THORACIC AND LUMBAR SPINE WITHOUT AND WITH CONTRAST  TECHNIQUE: Multiplanar and multiecho pulse sequences of the thoracic and lumbar spine were obtained without and with intravenous contrast.  CONTRAST:  85mL MULTIHANCE GADOBENATE DIMEGLUMINE 529 MG/ML IV SOLN  COMPARISON:  CT chest, abdomen, and pelvis 02/21/2014  FINDINGS: MR THORACIC SPINE FINDINGS  Vertebral alignment is normal. Numerous Schmorl's nodes are again seen throughout the thoracic spine. Mild T12 compression fracture demonstrates unchanged height loss compared to 08/17/2013 with minimal marrow edema and enhancement subjacent to the superior endplate left of midline. Minimal anterior wedging of multiple other mid to lower thoracic vertebral bodies is unchanged. No acute compression fracture is identified. Vertebral bone marrow signal is mildly heterogeneous diffusely with scattered areas of fatty degenerative marrow changes, most prominently T6-7. Small central disc protrusions are  present at T5-6 and T7-8 without spinal stenosis. There is a moderate central disc protrusion at T8-9 which contacts and mildly deforms the spinal cord without spinal canal stenosis. Small to moderate central disc protrusion is also present at T12-L1 without cord contact or deformation. The thoracic spinal cord is normal in signal. Paraspinal soft tissues are unremarkable.  MR LUMBAR SPINE FINDINGS  Vertebral alignment is normal. Mild L3 and L4 compression fractures are again seen status post cement augmentation without evidence of significant progressive height loss. Mild L5 compression fracture is similar to prior CT and is without marrow edema. Minimal L2 superior endplate depression is also unchanged without marrow edema. Multilevel degenerative marrow changes are present. Multiple Schmorl's nodes are again seen. Conus medullaris is normal in signal and terminates at L2. Heterogeneous marrow signal is partially visualized in the right sacral ala corresponding to heterogeneous sclerosis and insufficiency fracture described on prior CT.  L1-2:  Negative.  L2-3: Circumferential disc bulging, minimal posterior displacement of the L3 superior endplate, and mild facet and ligamentum flavum hypertrophy result in mild spinal stenosis without neural foraminal stenosis.  L3-4: Mild disc bulging and mild facet and ligamentum flavum hypertrophy without spinal canal or neural foraminal stenosis.  L4-5: Mild-to-moderate circumferential disc bulging  and moderate facet and ligamentum flavum hypertrophy result in mild to moderate spinal stenosis and mild right greater than left neural foraminal stenosis.  L5-S1: Mild disc bulge and moderate facet and ligamentum flavum hypertrophy result in mild spinal stenosis without significant neural foraminal stenosis.  IMPRESSION: 1. Multiple mild compression fractures in the thoracic and lumbar spine without significant interval change in degree of height loss compared to prior CTs. Mild  residual marrow edema is noted at T12. 2. Multiple small thoracic disc protrusions, most notable at T8-9 where the disc contacts and mildly deforms the spinal cord without stenosis. 3. Moderate lumbar spondylosis, greatest at L4-5 where there is mild-to-moderate spinal stenosis.   Electronically Signed   By: Logan Bores   On: 08/09/2014 10:34   ASSESSMENT: 68 y.o. [place] man with a history of well-differentiated lymphocytic lymphoma/ chronic lymphoid leukemia initially diagnosed in 2000, not requiring intervention until 2006; with multiple chromosomal abnormalities.  His treatment history is as follows:  (1) fludarabine/cyclophosphamide/rituximab x5 completed May 2007.   (2) rituximab for 8 doses October 2010, with partial response   (3) Leustatin and ofatumumab weekly x8 July to September 2011 followed by maintenance ofatumumab  every 2 months, with initial response but rising counts September 2012   (4) status-post unrelated donor stem-cell transplant 02/24/2012 at [facility]  (a) conditioning regimen consisted of fludarabine + TBI at 200 cGy, followed by rituximab x27;  (b) CMV reactivation x3 (patient CMV positive, donor negative), s/p ganciclovir treatment; 3d reactivation August 2013, s/p gancyclovir, with negative PCR mid-September 2013; last gancyclovir dose 10/06/2012 (c) Chronic GVHD: involving gut and skin, treated with steroids, tacrolimus and MMF.  MMF was eventually d/c'd and tacrolimus currently at a dose of 1.$RemoveB'5mg'LPzuNBVl$  BID (d) atrial fibrillation: resolved on brief amiodarone regimen (e) steroid-induced myopathy: improving  (f) hypomagnesemia: improved after d/c gancyclovir, needs continuing support (g) hypogammaglobulinemia: requiring IVIG most recently 08/03/2014.  Will consider administering IVIG for IGG less than 500 (unless travel to [facility] upcoming) (h) history of elevated triglycerides (606 on 07/14/2012)  (i) adrenal insufficiency: on prednisone and budesonide (j)  pancytopenia,resolved (k) brief episode of neutropenia (ANC 300) February 2015, accompanied by diarrhea; resolved   (5) restaging studies February-March 2015 including CT scans, flow cytometry, and bone marrow biopsy, showed no evidence of residual chronic lymphoid leukemia.  (6) recurrent GVHD (skin rash, mouth changes, severe diarrhea and gastric/duodenal/colonic biopsies 11/17/2012 c/w GVHD grade 2) : now grade 1 to inactive Results for DAJOHN, ELLENDER (MRN [id number]) as of 09/07/2014 12:14  Ref. 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 Status post kyphoplasty at L3-4 in June 2014. Also with evidence of rib fractures and insufficiency fractures bilaterally of the sacral  alae, noted by CT in March 2015. --   Denosumab started 12/08/2013, given as prolia Q6 months which is what has been approved by his insurance, most recent dose 03/30/2014  (12) chronic back pain and hip pain previously required Oxycontin and Norco, now taking Norco alone if needed (  several weeks ago)  (12) nausea: well controlled on current meds  (13)  Positive c.diff, 03/08/2013, on Flagyl 500 mg TID x 20 days, then on oral vanco with Questran, showing improvement; positive when repeated April 2014; Negative x 3   (14) persistently increased BUN and potassium (today 5.0)  (15)  Hypertension, on labetalol, cardizem,  lisinopril, and furosemide; managed by Dr. Brigitte Pulse  (16) steroid induced hyperglycemia, on sterlix and 70/30 insulin, followed by Dr. Loanne Drilling and Dr. Brigitte Pulse  (17) hypogammaglobulinemia-- requiring intermittent supplementation and we consider this for IGG less than 500  (18) squamous cell CA in situ removed from left parietal scalp October 2014, second lesion removed September 2015   PLAN: Christropher is doing well today.  He is about the same in the regards of GVHD.  He is taking Prednisone $RemoveBeforeDEI'5mg'okytcoUFwGgQomsB$  in the morning and 2.5 mg at night.  He is taking Prograf 1.5 mg BID.  He does continue to have a bib like rash which is stable.  His diarrhea is doing stable today as well.    He is also taking daily Bactrim as his prophylaxis.    As per Dr. Virgie Dad last note, "We are going to continue magnesium replacement in 500 cc of normal saline every Thursday for the next several months. He will see [name] every 3 weeks instead of every 2 weeks. We will continue to follow his lab work. We will continue to monitor for graft-versus-host disease."  His next appointment is on 10/05/14 with Dr. Jana Hakim.    He is scheduled to return to [facility] in February of 2016.  Timothy [name] knows to call for any problems that may develop before his next visit here.  I spent 25 minutes counseling the patient face to face.  The total time spent in the appointment was 30 minutes.   Minette Headland, [place] [phone] 10/01/2014

## 2014-09-28 NOTE — Patient Instructions (Signed)
Hypomagnesemia Magnesium is a common ion (mineral) in the body which is needed for metabolism. It is about how the body handles food and other chemical reactions necessary for life. Only about 2% of the magnesium in our body is found in the blood. When this is low, it is called hypomagnesemia. The blood will measure only a tiny amount of the magnesium in our body. When it is low in our blood, it does not mean that the whole body supply is low. The normal serum concentration ranges from 1.8-2.5 mEq/L. When the level gets to be less than 1.0 mEq/L, a number of problems begin to happen.  CAUSES   Receiving intravenous fluids without magnesium replacement.  Loss of magnesium from the bowel by nasogastric suction.  Loss of magnesium from nausea and vomiting or severe diarrhea. Any of the inflammatory bowel conditions can cause this.  Abuse of alcohol often leads to low serum magnesium.  An inherited form of magnesium loss happens when the kidneys lose magnesium. This is called familial or primary hypomagnesemia.  Some medications such as diuretics also cause the loss of magnesium. SYMPTOMS  These following problems are worse if the changes in magnesium levels come on suddenly.  Tremor.  Confusion.  Muscle weakness.  Oversensitive to sights and sounds.  Sensitive reflexes.  Depression.  Muscular fibrillations.  Overreactivity of the nerves.  Irritability.  Psychosis.  Spasms of the hand muscles.  Tetany (where the muscles go into uncontrollable spasms). DIAGNOSIS  This condition can be diagnosed by blood tests. TREATMENT   In an emergency, magnesium can be given intravenously (by vein).  If the condition is less worrisome, it can be corrected by diet. High levels of magnesium are found in green leafy vegetables, peas, beans, and nuts among other things. It can also be given through medications by mouth.  If it is being caused by medications, changes can be made.  If  alcohol is a problem, help is available if there are difficulties giving it up. Document Released: 09/03/2005 Document Revised: 04/24/2014 Document Reviewed: 07/28/2008 [employer] Patient Information 2015 [employer]. This information is not intended to replace advice given to you by your health care provider. Make sure you discuss any questions you have with your health care provider.  

## 2014-10-05 ENCOUNTER — Ambulatory Visit (HOSPITAL_BASED_OUTPATIENT_CLINIC_OR_DEPARTMENT_OTHER): Payer: BC Managed Care – PPO

## 2014-10-05 ENCOUNTER — Other Ambulatory Visit (HOSPITAL_BASED_OUTPATIENT_CLINIC_OR_DEPARTMENT_OTHER): Payer: BC Managed Care – PPO

## 2014-10-05 ENCOUNTER — Ambulatory Visit (HOSPITAL_BASED_OUTPATIENT_CLINIC_OR_DEPARTMENT_OTHER): Payer: BC Managed Care – PPO | Admitting: Oncology

## 2014-10-05 DIAGNOSIS — M810 Age-related osteoporosis without current pathological fracture: Secondary | ICD-10-CM

## 2014-10-05 DIAGNOSIS — T8609 Other complications of bone marrow transplant: Secondary | ICD-10-CM

## 2014-10-05 DIAGNOSIS — T380X5A Adverse effect of glucocorticoids and synthetic analogues, initial encounter: Secondary | ICD-10-CM

## 2014-10-05 DIAGNOSIS — Z9489 Other transplanted organ and tissue status: Secondary | ICD-10-CM

## 2014-10-05 DIAGNOSIS — D809 Immunodeficiency with predominantly antibody defects, unspecified: Secondary | ICD-10-CM

## 2014-10-05 DIAGNOSIS — D89811 Chronic graft-versus-host disease: Secondary | ICD-10-CM

## 2014-10-05 DIAGNOSIS — E099 Drug or chemical induced diabetes mellitus without complications: Secondary | ICD-10-CM

## 2014-10-05 DIAGNOSIS — M549 Dorsalgia, unspecified: Secondary | ICD-10-CM

## 2014-10-05 DIAGNOSIS — R197 Diarrhea, unspecified: Secondary | ICD-10-CM

## 2014-10-05 DIAGNOSIS — D899 Disorder involving the immune mechanism, unspecified: Secondary | ICD-10-CM

## 2014-10-05 DIAGNOSIS — C9110 Chronic lymphocytic leukemia of B-cell type not having achieved remission: Secondary | ICD-10-CM

## 2014-10-05 DIAGNOSIS — G8929 Other chronic pain: Secondary | ICD-10-CM

## 2014-10-05 DIAGNOSIS — D849 Immunodeficiency, unspecified: Secondary | ICD-10-CM

## 2014-10-05 LAB — CBC WITH DIFFERENTIAL/PLATELET
BASO%: 0.7 % (ref 0.0–2.0)
Basophils Absolute: 0 10*3/uL (ref 0.0–0.1)
EOS ABS: 0 10*3/uL (ref 0.0–0.5)
EOS%: 0.6 % (ref 0.0–7.0)
HCT: 40.8 % (ref 38.4–49.9)
HGB: 13.2 g/dL (ref 13.0–17.1)
LYMPH#: 1.6 10*3/uL (ref 0.9–3.3)
LYMPH%: 34.3 % (ref 14.0–49.0)
MCH: 30.3 pg (ref 27.2–33.4)
MCHC: 32.4 g/dL (ref 32.0–36.0)
MCV: 93.6 fL (ref 79.3–98.0)
MONO#: 0.4 10*3/uL (ref 0.1–0.9)
MONO%: 9.3 % (ref 0.0–14.0)
NEUT%: 55.1 % (ref 39.0–75.0)
NEUTROS ABS: 2.7 10*3/uL (ref 1.5–6.5)
PLATELETS: 142 10*3/uL (ref 140–400)
RBC: 4.36 10*6/uL (ref 4.20–5.82)
RDW: 15.5 % — AB (ref 11.0–14.6)
WBC: 4.8 10*3/uL (ref 4.0–10.3)

## 2014-10-05 LAB — LACTATE DEHYDROGENASE (CC13): LDH: 211 U/L (ref 125–245)

## 2014-10-05 LAB — COMPREHENSIVE METABOLIC PANEL (CC13)
ALBUMIN: 3.6 g/dL (ref 3.5–5.0)
ALT: 125 U/L — AB (ref 0–55)
ANION GAP: 7 meq/L (ref 3–11)
AST: 63 U/L — ABNORMAL HIGH (ref 5–34)
Alkaline Phosphatase: 101 U/L (ref 40–150)
BUN: 42.9 mg/dL — ABNORMAL HIGH (ref 7.0–26.0)
CHLORIDE: 111 meq/L — AB (ref 98–109)
CO2: 20 meq/L — AB (ref 22–29)
Calcium: 9.3 mg/dL (ref 8.4–10.4)
Creatinine: 1.2 mg/dL (ref 0.7–1.3)
Glucose: 109 mg/dl (ref 70–140)
POTASSIUM: 4.7 meq/L (ref 3.5–5.1)
SODIUM: 139 meq/L (ref 136–145)
TOTAL PROTEIN: 6.2 g/dL — AB (ref 6.4–8.3)
Total Bilirubin: 0.31 mg/dL (ref 0.20–1.20)

## 2014-10-05 LAB — MAGNESIUM (CC13): Magnesium: 1.9 mg/dl (ref 1.5–2.5)

## 2014-10-05 MED ORDER — HEPARIN SOD (PORK) LOCK FLUSH 100 UNIT/ML IV SOLN
500.0000 [IU] | Freq: Once | INTRAVENOUS | Status: AC
  Administered 2014-10-05: 500 [IU] via INTRAVENOUS
  Filled 2014-10-05: qty 5

## 2014-10-05 MED ORDER — SODIUM CHLORIDE 0.9 % IV SOLN
INTRAVENOUS | Status: DC
  Administered 2014-10-05: 14:00:00 via INTRAVENOUS

## 2014-10-05 MED ORDER — SODIUM CHLORIDE 0.9 % IJ SOLN
10.0000 mL | INTRAMUSCULAR | Status: DC | PRN
  Administered 2014-10-05: 10 mL via INTRAVENOUS
  Filled 2014-10-05: qty 10

## 2014-10-05 MED ORDER — SODIUM CHLORIDE 0.9 % IV SOLN
1.0000 g | Freq: Once | INTRAVENOUS | Status: AC
  Administered 2014-10-05: 1 g via INTRAVENOUS
  Filled 2014-10-05: qty 2

## 2014-10-05 NOTE — Progress Notes (Signed)
ID: Timothy [name]   DOB: [birth year]  MR#: [id number]  UGQ#:[id number]  UEK:CMKL, Timothy Saxon, MD SU: OTHER MD: Timothy [name]; Timothy Shin, MD; [name], Timothy Hatcher,MD; Timothy Rosenthal, MD; [facility] in [place], [place]:  Timothy Duval, RN [phone])  CHIEF COMPLAINT:  CLL, status post allogeneic stem cell transplant, GVHD CURRENT TREATMENT: Immunosuppression  HISTORY OF CLL: From the original intake note:  We have very complete records from Dr. Racheal Patches in [place], and in summary:  The patient was initially diagnosed in August 2000, with a white cell count of 23,600, but normal hemoglobin and platelets, and no significant symptomatology. Over the next several years his white cell count drifted up, and he eventually developed some symptoms of night sweats in particular, leading to treatment with fludarabine, Cytoxan and rituxan for five cycles given between December 2006 and May 2007.  We have CT scans from June 2006, November 2006 and April 2007, and comparing the November 2006 and April 2007 scans, there was near complete response. He had subsequent therapy in [place] as detailed below, but with decreased response, leading to allogeneic stem-cell transplant at the [facility] 02/24/2012.  Subsequent history is as detailed below.  INTERVAL HISTORY: Timothy [name] returns today with his wife, Timothy [name], for follow up of his chronic lymphoid leukemia. He was late for his visit today because he had an unanticipated bout of diarrhea. He "didn't make it" to the toilet. He tells me he had 4 diarrheal episode yesterday, although he was hesitant to call and diarrhea all of them were at night. He tells me for the last week he has had more bowel movements that are little bit on the looser side.  REVIEW OF SYSTEMS: Aside from the bowel movement issue, he tells me his skin is fine, he denies any mouth problems, he has had no fevers, and while he  occasionally develops ankle edema that has not been more persistent or intense than before. He tells me he is now not using his walker accepting crowds, and that he can walk 15 minutes at a time without the walker. He goes up 14 steps in his house without stopping. His quads are little bit stronger and he continues to practice getting up from a chair without using his hands. A detailed review of systems today was otherwise stable  PAST MEDICAL HISTORY: Past Medical History  Diagnosis Date  . 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 Diverticular disease   . Hyperlipidemia   . Obesity   . Hypertension   . Hiatal hernia   . CMV (cytomegalovirus) antibody positive     pre-transplant, with seroconversion x2 pst-transplant  . Right bundle branch block     pre-transplant  . CKD (chronic kidney disease) stage 2, GFR 60-89 ml/min   . Pancytopenia   . Steroid-induced diabetes   . Atrial fibrillation     post-transplant  . Myopathy   . 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 Leukemia, chronic lymphoid   . 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 Diarrhea in adult patient 12/05/2012    Due to active GVHD  . CLL (chronic lymphocytic leukemia) 12/05/2012    Dx 07/1999; started Rx 12/06  AlloBMT 3/13  . Rash of face 12/05/2012    Due to GVHD  . Hypomagnesemia 01/26/2013  . Left hip pain 12/01/2013    PAST SURGICAL HISTORY: Past Surgical History  Procedure Laterality Date  . Tonsillectomy and adenoidectomy    . Bone marrow transplant    . Flexible sigmoidoscopy  11/17/2012    Procedure: FLEXIBLE SIGMOIDOSCOPY;  Surgeon: Jeryl Columbia, MD;  Location: [facility];  Service: Endoscopy;  Laterality: N/A;  Dr Watt Climes states will be admitted to rooom 1339 11/16/12  . Esophagogastroduodenoscopy  11/17/2012    Procedure: ESOPHAGOGASTRODUODENOSCOPY (EGD);  Surgeon: Jeryl Columbia, MD;  Location: [facility];  Service: Endoscopy;  Laterality: N/A;    FAMILY HISTORY Family History  Problem Relation Age of Onset  . Cancer Father   The patient's father died from complications of chronic lymphocytic leukemia at the age of 70.  It had been diagnosed seven years before when he was 75.  The patient is enrolled in a familial chronic lymphocytic leukemia study out of the [employer].  The patient's mother is 28, alive, unfortunately suffering with dementia, and he has a brother, 26, who is otherwise in fair health.   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  His wife of >40 years, Timothy [name], is a homemaker.  Their daughter, Timothy [name], lives in [place].  She also is a Agricultural engineer.  The patient has an 77 year old grandson and an 61-year-old granddaughter, and that is really the main reason he moved to this area.  He is a Tourist information centre manager.     ADVANCED DIRECTIVES: In place  HEALTH MAINTENANCE: (Updated 04/13/2014) History  Substance Use Topics  . Smoking status: Never Smoker   . Smokeless tobacco: Never Used  . Alcohol Use: No     Colonoscopy: Nov 2013, Dr. Watt Climes  PSA: Not on file  Bone density:  Feb 2014;  Patient also has known insufficiency and pathologic fractures in addition to his long-standing history of steroid use.  Lipid panel: Jan 2015, elevated    Allergies  Allergen Reactions  . Benadryl [Diphenhydramine Hcl]     "Restless leg syndrome"    Current Outpatient Prescriptions  Medication Sig Dispense Refill  . acyclovir (ZOVIRAX) 400 MG tablet Take 2 tablets (800 mg total) by mouth 2 (two) times daily.  120 tablet  3  . budesonide (ENTOCORT EC) 3 MG 24 hr capsule Take 1 capsule (3 mg total) by mouth 3 (three) times daily.  90 capsule  6  . CARTIA XT  240 MG 24 hr capsule TAKE 1 CAPSULE BY MOUTH DAILY  30 capsule  3  . cholestyramine (QUESTRAN) 4 G packet Take 1 packet (4 g total) by mouth daily.  30 each  12  . diltiazem (DILACOR XR) 240 MG 24 hr capsule Take 1 capsule (240 mg total) by mouth every evening.  30 capsule  5  . fluconazole (DIFLUCAN) 100 MG tablet Take 1 tablet (100 mg total) by mouth once.  30 tablet  3  . furosemide (LASIX) 20 MG tablet Take 1 tablet (20 mg total) by mouth daily.  30 tablet  5  . glucose blood (ONE TOUCH ULTRA TEST) test strip Test before meals and at bedtime.  300 each  0  . HYDROcodone-acetaminophen (NORCO) 7.5-325 MG per tablet Take 1 tablet by mouth every 4 (four) hours as needed for moderate pain.  120 tablet  0  . Insulin Aspart Prot & Aspart (NOVOLOG 70/30 MIX) (70-30) 100 UNIT/ML Pen Inject 10  Units into the skin 2 (two) times daily. 10 units with breakfast, 10 units with dinner      . Insulin Pen Needle (B-D UF III MINI PEN NEEDLES) 31G X 5 MM MISC Use twice daily with insulin as directed  100 each  5  . labetalol (NORMODYNE) 200 MG tablet Take 400 mg by mouth 2 (two) times daily.       Marland Kitchen lidocaine (LIDODERM) 5 % Place 1 patch onto the skin daily. Remove & Discard patch within 12 hours or as directed by MD  30 patch  0  . Lidocaine-Hydrocortisone Ace 3-0.5 % KIT Apply 1 application topically as needed (for pain).  1 each  3  . lidocaine-prilocaine (EMLA) cream Apply 1 application topically as needed. 1-2 hrs before each port access  30 g  6  . lisinopril (PRINIVIL,ZESTRIL) 10 MG tablet Take 10 mg by mouth every evening.       . loratadine (CLARITIN) 10 MG tablet Take 10 mg by mouth daily.      . Multiple Vitamin (MULTIVITAMIN WITH MINERALS) TABS tablet Take 1 tablet by mouth daily.      Marland Kitchen omeprazole (PRILOSEC) 20 MG capsule Take 1 capsule (20 mg total) by mouth daily.  30 capsule  5  . ONETOUCH DELICA LANCETS 63K MISC Test before meals and at bedtime.  300 each  0  . predniSONE (DELTASONE) 5 MG tablet  Take 0.5-1 tablets (2.5-5 mg total) by mouth 2 (two) times daily. Takes $RemoveBefo'5mg'YLXwwUNkftC$  in the morning and 2.$RemoveBefor'5mg'HzxYAVNwvgzz$  in the evening.  45 tablet  2  . sertraline (ZOLOFT) 50 MG tablet ALTERNATE TAKING 1 TABLET DAILY AND 2 TABLETS DAILY  90 tablet  5  . sulfamethoxazole-trimethoprim (BACTRIM DS) 800-160 MG per tablet Take 1 tablet by mouth daily.  30 tablet  5  . tacrolimus (PROGRAF) 0.5 MG capsule Take 3 capsules (1.5 mg total) by mouth 2 (two) times daily.  180 capsule  4  . vancomycin (VANCOCIN) 125 MG capsule       . VOLTAREN 1 % GEL Apply 2 g topically 2 (two) times daily. Applied to back      . [DISCONTINUED] insulin aspart (NOVOLOG FLEXPEN) 100 UNIT/ML SOPN FlexPen 18units sq qam, 9units sq qpm, or as directed  15 mL  1   No current facility-administered medications for this visit.   Facility-Administered Medications Ordered in Other Visits  Medication Dose Route Frequency Provider Last Rate Last Dose  . 0.9 %  sodium chloride infusion   Intravenous Continuous Amy G Berry, PA-C 500 mL/hr at 03/12/13 0900    . sodium chloride 0.9 % injection 10 mL  10 mL Intravenous PRN Chauncey Cruel, MD   10 mL at 08/11/12 1606    OBJECTIVE: Middle-aged white man in no acute distress Filed Vitals:   10/05/14 1250  BP: 150/93  Pulse: 72  Temp: 97.9 F (36.6 C)  Resp: 18  Body mass index is 34.99 kg/(m^2).  ECOG: 1 Filed Weights   10/05/14 1250  Weight: 216 lb 11.2 oz (98.294 kg)   Sclerae unicteric, pupils equal and reactive Oropharynx clear and moist-- minimal coating on the tongue, no obvious thrush No cervical or supraclavicular adenopathy Lungs no rales or rhonchi, good excursion bilaterally Heart regular rate and rhythm Abd soft, nontender, positive bowel sounds, not increased MSK no focal spinal tenderness, 1+ bilateral ankle edema as previously noted Neuro: nonfocal, well oriented, appropriate affect Breasts: Deferred Skin: No evidence of rash  LABS:  CBC  Component Value Date/Time   WBC  4.8 10/05/2014 1239   WBC 7.1 04/06/2014 0949   RBC 4.36 10/05/2014 1239   RBC 4.06* 04/06/2014 0949   RBC 3.82* 03/16/2013 1400   HGB 13.2 10/05/2014 1239   HGB 12.2* 04/06/2014 0949   HCT 40.8 10/05/2014 1239   HCT 38.5* 04/06/2014 0949   PLT 142 10/05/2014 1239   PLT 144* 04/06/2014 0949   MCV 93.6 10/05/2014 1239   MCV 94.8 04/06/2014 0949   MCH 30.3 10/05/2014 1239   MCH 30.0 04/06/2014 0949   MCHC 32.4 10/05/2014 1239   MCHC 31.7 04/06/2014 0949   RDW 15.5* 10/05/2014 1239   RDW 15.7* 04/06/2014 0949   LYMPHSABS 1.6 10/05/2014 1239   LYMPHSABS 1.4 03/18/2013 0615   MONOABS 0.4 10/05/2014 1239   MONOABS 0.3 03/18/2013 0615   EOSABS 0.0 10/05/2014 1239   EOSABS 0.0 03/18/2013 0615   BASOSABS 0.0 10/05/2014 1239   BASOSABS 0.0 03/18/2013 0615       Chemistry      Component Value Date/Time   NA 140 09/28/2014 1333   NA 139 09/07/2014 1324   K 5.0 09/28/2014 1333   K 4.8 09/07/2014 1324   CL 110 09/07/2014 1324   CL 101 06/15/2013 1034   CO2 20* 09/28/2014 1333   CO2 15* 09/07/2014 1324   BUN 39.0* 09/28/2014 1333   BUN 46* 09/07/2014 1324   CREATININE 1.3 09/28/2014 1333   CREATININE 1.20 09/07/2014 1324      Component Value Date/Time   CALCIUM 9.0 09/28/2014 1333   CALCIUM 9.1 09/07/2014 1324   ALKPHOS 80 09/28/2014 1333   ALKPHOS 80 09/07/2014 1324   AST 31 09/28/2014 1333   AST 40* 09/07/2014 1324   ALT 47 09/28/2014 1333   ALT 55* 09/07/2014 1324   BILITOT 0.31 09/28/2014 1333   BILITOT <0.2* 09/07/2014 1324     STUDIES: No results found.  ASSESSMENT: 68 y.o. [place] man with a history of well-differentiated lymphocytic lymphoma/ chronic lymphoid leukemia initially diagnosed in 2000, not requiring intervention until 2006; with multiple chromosomal abnormalities.  His treatment history is as follows:  (1) fludarabine/cyclophosphamide/rituximab x5 completed May 2007.   (2) rituximab for 8 doses October 2010, with partial response   (3) Leustatin and ofatumumab weekly x8 July to  September 2011 followed by maintenance ofatumumab  every 2 months, with initial response but rising counts September 2012   (4) status-post unrelated donor stem-cell transplant 02/24/2012 at [facility]  (a) conditioning regimen consisted of fludarabine + TBI at 200 cGy, followed by rituximab x27;  (b) CMV reactivation x3 (patient CMV positive, donor negative), s/p ganciclovir treatment; 3d reactivation August 2013, s/p gancyclovir, with negative PCR mid-September 2013; last gancyclovir dose 10/06/2012 (c) Chronic GVHD: involving gut and skin, treated with steroids, tacrolimus and MMF.  MMF was eventually d/c'd and tacrolimus currently at a dose of 1.$RemoveB'5mg'VKetqyZX$  BID (d) atrial fibrillation: resolved on brief amiodarone regimen (e) steroid-induced myopathy: improving  (f) hypomagnesemia: improved after d/c gancyclovir, needs continuing support (g) hypogammaglobulinemia: requiring IVIG most recently 08/03/2014. (h) history of elevated triglycerides (606 on 07/14/2012)  (i) adrenal insufficiency: on prednisone and budesonide (j) pancytopenia,resolved (k) brief episode of neutropenia ([place] 300) February 2015, accompanied by diarrhea; resolved   (5) restaging studies February-March 2015 including CT scans, flow cytometry, and bone marrow biopsy, showed no evidence of residual chronic lymphoid leukemia.  (6) recurrent GVHD (skin rash, mouth changes, severe diarrhea and gastric/duodenal/colonic biopsies 11/17/2012 c/w GVHD grade 2) : now  grade 1 to inactive  (7)  malnutrition -- on VITAL supplement in addition to regular diet; on Marinol for anorexia  (8) testosterone deficiency--on patch   (9) deconditioning: Especially quad weakness; continuing rehabilitation exercises  (10) mild dehydration: encouraged increased po fluids; receives IVF support w magnesium weekly  (11) severe steroid-induced osteoporosis with compression fractures: received pamidronate 12/18/2012. Status post kyphoplasty at L3-4 in  June 2014. Also with evidence of rib fractures and insufficiency fractures bilaterally of the sacral  alae, noted by CT in March 2015. --   Denosumab started 12/08/2013, given as prolia Q6 months which is what has been approved by his insurance, most recent dose 03/30/2014  (12) chronic back pain and hip pain controlled with OxyContin and hydrocodone/APAP.  (12) nausea: well controlled on current meds  (13)  Positive c.diff, 03/08/2013, on Flagyl 500 mg TID x 20 days, then on oral vanco with Questran, showing improvement; positive when repeated April 2014; Negative x 3 since then  (14) persistently increased BUN and potassium  (15)  Hypertension, on labetalol, cardizem, lisinopril, and furosemide; managed by Dr. Brigitte Pulse  (16) steroid induced hyperglycemia, on sterlix and 70/30 insulin, followed by Dr. Loanne Drilling and Dr. Brigitte Pulse  (17) hypogammaglobulinemia-- requiring intermittent supplementation  (18) squamous cell CA in situ removed from left parietal scalp October 2014, second lesion to be removed may 2015   PLAN: Aubrey is having a little bit more diarrhea, especially nighttime diarrhea. There is no skin change, no mouth problems, no fevers, and really no other symptoms of graft-versus-host. Accordingly I am concerned he may have developed a recrudescence of his C. difficile. We gave him a "patch" to collect a sample and he will provided possibly today are certainly no later than tomorrow. If the C. difficile is back of course we will go back to vancomycin which was very effective for him.  Otherwise I was delighted today to see that he is almost able to completely get off a chair at average height without having to push off. When he first tried to do this 6 months ago he nearly fell on his bottom.  He is a little bit frustrated and possibly a little bit depressed because these problems are not going away or if they are they're going away very slowly. I do think he is making progress, again "baby  steps". The fact that he feels frustrated means he wishes to be healthy, which is the good thank. The idea is to get the frustration to work for him. If he gets them to do twice as much exercise on a daily basis and he has been doing than the frustration will be worth it.  We're going to continue fluids and magnesium weekly, visits every 2 weeks. He knows to call for any problems that may develop before the next visit here. Marland Kitchen Chauncey Cruel, MD 10/05/2014

## 2014-10-05 NOTE — Patient Instructions (Signed)
Hypomagnesemia Magnesium is a common ion (mineral) in the body which is needed for metabolism. It is about how the body handles food and other chemical reactions necessary for life. Only about 2% of the magnesium in our body is found in the blood. When this is low, it is called hypomagnesemia. The blood will measure only a tiny amount of the magnesium in our body. When it is low in our blood, it does not mean that the whole body supply is low. The normal serum concentration ranges from 1.8-2.5 mEq/L. When the level gets to be less than 1.0 mEq/L, a number of problems begin to happen.  CAUSES   Receiving intravenous fluids without magnesium replacement.  Loss of magnesium from the bowel by nasogastric suction.  Loss of magnesium from nausea and vomiting or severe diarrhea. Any of the inflammatory bowel conditions can cause this.  Abuse of alcohol often leads to low serum magnesium.  An inherited form of magnesium loss happens when the kidneys lose magnesium. This is called familial or primary hypomagnesemia.  Some medications such as diuretics also cause the loss of magnesium. SYMPTOMS  These following problems are worse if the changes in magnesium levels come on suddenly.  Tremor.  Confusion.  Muscle weakness.  Oversensitive to sights and sounds.  Sensitive reflexes.  Depression.  Muscular fibrillations.  Overreactivity of the nerves.  Irritability.  Psychosis.  Spasms of the hand muscles.  Tetany (where the muscles go into uncontrollable spasms). DIAGNOSIS  This condition can be diagnosed by blood tests. TREATMENT   In an emergency, magnesium can be given intravenously (by vein).  If the condition is less worrisome, it can be corrected by diet. High levels of magnesium are found in green leafy vegetables, peas, beans, and nuts among other things. It can also be given through medications by mouth.  If it is being caused by medications, changes can be made.  If  alcohol is a problem, help is available if there are difficulties giving it up. Document Released: 09/03/2005 Document Revised: 04/24/2014 Document Reviewed: 07/28/2008 [employer] Patient Information 2015 [employer]. This information is not intended to replace advice given to you by your health care provider. Make sure you discuss any questions you have with your health care provider.  

## 2014-10-06 ENCOUNTER — Telehealth: Payer: Self-pay | Admitting: Oncology

## 2014-10-06 ENCOUNTER — Telehealth: Payer: Self-pay | Admitting: *Deleted

## 2014-10-06 ENCOUNTER — Other Ambulatory Visit: Payer: Self-pay

## 2014-10-06 LAB — IGG, IGA, IGM
IGA: 9 mg/dL — AB (ref 68–379)
IgG (Immunoglobin G), Serum: 389 mg/dL — ABNORMAL LOW (ref 650–1600)
IgM, Serum: 19 mg/dL — ABNORMAL LOW (ref 41–251)

## 2014-10-06 LAB — TACROLIMUS LEVEL: Tacrolimus Lvl: 7.3 ng/mL (ref 5.0–20.0)

## 2014-10-06 NOTE — Telephone Encounter (Signed)
s.w. pt and advised on OCT and NOV appt....pt will pick up new sched at nxt visit

## 2014-10-06 NOTE — Telephone Encounter (Signed)
pt spouse came in to get copy of sch-printed

## 2014-10-06 NOTE — Telephone Encounter (Signed)
This RN spoke with Joellen Jersey at [facility] in [place] per MD request per protoccol for IVIG and return visit.  Above discussed with Joellen Jersey- who requested most recent labs/med list and dictation to be faxed to her.  Pt would be reviewed at conference on Tuesday and call returned per inquiry.  Phone number for above is [phone] Fax number is [phone].

## 2014-10-12 ENCOUNTER — Telehealth: Payer: Self-pay | Admitting: Oncology

## 2014-10-12 ENCOUNTER — Ambulatory Visit (HOSPITAL_BASED_OUTPATIENT_CLINIC_OR_DEPARTMENT_OTHER): Payer: BC Managed Care – PPO

## 2014-10-12 ENCOUNTER — Telehealth: Payer: Self-pay | Admitting: *Deleted

## 2014-10-12 ENCOUNTER — Other Ambulatory Visit: Payer: Self-pay | Admitting: Oncology

## 2014-10-12 ENCOUNTER — Other Ambulatory Visit (HOSPITAL_BASED_OUTPATIENT_CLINIC_OR_DEPARTMENT_OTHER): Payer: BC Managed Care – PPO

## 2014-10-12 DIAGNOSIS — C9110 Chronic lymphocytic leukemia of B-cell type not having achieved remission: Secondary | ICD-10-CM

## 2014-10-12 LAB — CBC WITH DIFFERENTIAL/PLATELET
BASO%: 0.4 % (ref 0.0–2.0)
Basophils Absolute: 0 10*3/uL (ref 0.0–0.1)
EOS%: 0.5 % (ref 0.0–7.0)
Eosinophils Absolute: 0 10*3/uL (ref 0.0–0.5)
HEMATOCRIT: 39.5 % (ref 38.4–49.9)
HGB: 12.8 g/dL — ABNORMAL LOW (ref 13.0–17.1)
LYMPH%: 33.7 % (ref 14.0–49.0)
MCH: 30.2 pg (ref 27.2–33.4)
MCHC: 32.3 g/dL (ref 32.0–36.0)
MCV: 93.5 fL (ref 79.3–98.0)
MONO#: 0.5 10*3/uL (ref 0.1–0.9)
MONO%: 8.8 % (ref 0.0–14.0)
NEUT%: 56.6 % (ref 39.0–75.0)
NEUTROS ABS: 3.3 10*3/uL (ref 1.5–6.5)
Platelets: 152 10*3/uL (ref 140–400)
RBC: 4.22 10*6/uL (ref 4.20–5.82)
RDW: 15.5 % — ABNORMAL HIGH (ref 11.0–14.6)
WBC: 5.8 10*3/uL (ref 4.0–10.3)
lymph#: 1.9 10*3/uL (ref 0.9–3.3)

## 2014-10-12 LAB — COMPREHENSIVE METABOLIC PANEL (CC13)
ALBUMIN: 3.6 g/dL (ref 3.5–5.0)
ALT: 46 U/L (ref 0–55)
ANION GAP: 7 meq/L (ref 3–11)
AST: 24 U/L (ref 5–34)
Alkaline Phosphatase: 74 U/L (ref 40–150)
BUN: 39.3 mg/dL — ABNORMAL HIGH (ref 7.0–26.0)
CALCIUM: 9.1 mg/dL (ref 8.4–10.4)
CO2: 20 meq/L — AB (ref 22–29)
CREATININE: 1.3 mg/dL (ref 0.7–1.3)
Chloride: 112 mEq/L — ABNORMAL HIGH (ref 98–109)
Glucose: 143 mg/dl — ABNORMAL HIGH (ref 70–140)
POTASSIUM: 4.9 meq/L (ref 3.5–5.1)
Sodium: 140 mEq/L (ref 136–145)
TOTAL PROTEIN: 5.9 g/dL — AB (ref 6.4–8.3)
Total Bilirubin: 0.33 mg/dL (ref 0.20–1.20)

## 2014-10-12 LAB — MAGNESIUM (CC13): Magnesium: 1.8 mg/dl (ref 1.5–2.5)

## 2014-10-12 MED ORDER — HEPARIN SOD (PORK) LOCK FLUSH 100 UNIT/ML IV SOLN
500.0000 [IU] | Freq: Once | INTRAVENOUS | Status: DC
  Filled 2014-10-12: qty 5

## 2014-10-12 MED ORDER — SODIUM CHLORIDE 0.9 % IV SOLN
1.0000 g | Freq: Once | INTRAVENOUS | Status: AC
  Administered 2014-10-12: 1 g via INTRAVENOUS
  Filled 2014-10-12: qty 2

## 2014-10-12 MED ORDER — SODIUM CHLORIDE 0.9 % IJ SOLN
10.0000 mL | INTRAMUSCULAR | Status: DC | PRN
  Filled 2014-10-12: qty 10

## 2014-10-12 NOTE — Progress Notes (Signed)
Per Dr.Magrinot, may run IVF over 1.5 hours.

## 2014-10-12 NOTE — Telephone Encounter (Signed)
I have adjusted 10/29 appt

## 2014-10-12 NOTE — Patient Instructions (Signed)
Hypomagnesemia Magnesium is a common ion (mineral) in the body which is needed for metabolism. It is about how the body handles food and other chemical reactions necessary for life. Only about 2% of the magnesium in our body is found in the blood. When this is low, it is called hypomagnesemia. The blood will measure only a tiny amount of the magnesium in our body. When it is low in our blood, it does not mean that the whole body supply is low. The normal serum concentration ranges from 1.8-2.5 mEq/L. When the level gets to be less than 1.0 mEq/L, a number of problems begin to happen.  CAUSES   Receiving intravenous fluids without magnesium replacement.  Loss of magnesium from the bowel by nasogastric suction.  Loss of magnesium from nausea and vomiting or severe diarrhea. Any of the inflammatory bowel conditions can cause this.  Abuse of alcohol often leads to low serum magnesium.  An inherited form of magnesium loss happens when the kidneys lose magnesium. This is called familial or primary hypomagnesemia.  Some medications such as diuretics also cause the loss of magnesium. SYMPTOMS  These following problems are worse if the changes in magnesium levels come on suddenly.  Tremor.  Confusion.  Muscle weakness.  Oversensitive to sights and sounds.  Sensitive reflexes.  Depression.  Muscular fibrillations.  Overreactivity of the nerves.  Irritability.  Psychosis.  Spasms of the hand muscles.  Tetany (where the muscles go into uncontrollable spasms). DIAGNOSIS  This condition can be diagnosed by blood tests. TREATMENT   In an emergency, magnesium can be given intravenously (by vein).  If the condition is less worrisome, it can be corrected by diet. High levels of magnesium are found in green leafy vegetables, peas, beans, and nuts among other things. It can also be given through medications by mouth.  If it is being caused by medications, changes can be made.  If  alcohol is a problem, help is available if there are difficulties giving it up. Document Released: 09/03/2005 Document Revised: 04/24/2014 Document Reviewed: 07/28/2008 [employer] Patient Information 2015 [employer]. This information is not intended to replace advice given to you by your health care provider. Make sure you discuss any questions you have with your health care provider.  

## 2014-10-19 ENCOUNTER — Encounter: Payer: Self-pay | Admitting: Nurse Practitioner

## 2014-10-19 ENCOUNTER — Other Ambulatory Visit: Payer: Self-pay | Admitting: *Deleted

## 2014-10-19 ENCOUNTER — Other Ambulatory Visit (HOSPITAL_BASED_OUTPATIENT_CLINIC_OR_DEPARTMENT_OTHER): Payer: BC Managed Care – PPO

## 2014-10-19 ENCOUNTER — Other Ambulatory Visit: Payer: Self-pay

## 2014-10-19 ENCOUNTER — Ambulatory Visit (HOSPITAL_BASED_OUTPATIENT_CLINIC_OR_DEPARTMENT_OTHER): Payer: BC Managed Care – PPO | Admitting: Nurse Practitioner

## 2014-10-19 ENCOUNTER — Ambulatory Visit (HOSPITAL_BASED_OUTPATIENT_CLINIC_OR_DEPARTMENT_OTHER): Payer: BC Managed Care – PPO

## 2014-10-19 VITALS — BP 146/47 | HR 72 | Temp 98.5°F | Resp 18 | Ht 66.0 in | Wt 216.5 lb

## 2014-10-19 DIAGNOSIS — D849 Immunodeficiency, unspecified: Secondary | ICD-10-CM

## 2014-10-19 DIAGNOSIS — C9110 Chronic lymphocytic leukemia of B-cell type not having achieved remission: Secondary | ICD-10-CM

## 2014-10-19 DIAGNOSIS — G8929 Other chronic pain: Secondary | ICD-10-CM

## 2014-10-19 DIAGNOSIS — T8609 Other complications of bone marrow transplant: Secondary | ICD-10-CM

## 2014-10-19 DIAGNOSIS — D89811 Chronic graft-versus-host disease: Secondary | ICD-10-CM

## 2014-10-19 LAB — COMPREHENSIVE METABOLIC PANEL (CC13)
ALK PHOS: 68 U/L (ref 40–150)
ALT: 32 U/L (ref 0–55)
AST: 20 U/L (ref 5–34)
Albumin: 3.6 g/dL (ref 3.5–5.0)
Anion Gap: 8 mEq/L (ref 3–11)
BUN: 40.3 mg/dL — AB (ref 7.0–26.0)
CALCIUM: 9.7 mg/dL (ref 8.4–10.4)
CHLORIDE: 112 meq/L — AB (ref 98–109)
CO2: 18 mEq/L — ABNORMAL LOW (ref 22–29)
Creatinine: 1.3 mg/dL (ref 0.7–1.3)
Glucose: 176 mg/dl — ABNORMAL HIGH (ref 70–140)
POTASSIUM: 5.2 meq/L — AB (ref 3.5–5.1)
Sodium: 137 mEq/L (ref 136–145)
Total Bilirubin: 0.31 mg/dL (ref 0.20–1.20)
Total Protein: 5.8 g/dL — ABNORMAL LOW (ref 6.4–8.3)

## 2014-10-19 LAB — CBC WITH DIFFERENTIAL/PLATELET
BASO%: 0 % (ref 0.0–2.0)
Basophils Absolute: 0 10*3/uL (ref 0.0–0.1)
EOS%: 0.4 % (ref 0.0–7.0)
Eosinophils Absolute: 0 10*3/uL (ref 0.0–0.5)
HCT: 38.8 % (ref 38.4–49.9)
HEMOGLOBIN: 12.8 g/dL — AB (ref 13.0–17.1)
LYMPH%: 37.4 % (ref 14.0–49.0)
MCH: 30.3 pg (ref 27.2–33.4)
MCHC: 33 g/dL (ref 32.0–36.0)
MCV: 91.9 fL (ref 79.3–98.0)
MONO#: 0.4 10*3/uL (ref 0.1–0.9)
MONO%: 7.3 % (ref 0.0–14.0)
NEUT#: 2.9 10*3/uL (ref 1.5–6.5)
NEUT%: 54.9 % (ref 39.0–75.0)
Platelets: 138 10*3/uL — ABNORMAL LOW (ref 140–400)
RBC: 4.22 10*6/uL (ref 4.20–5.82)
RDW: 14.9 % — AB (ref 11.0–14.6)
WBC: 5.3 10*3/uL (ref 4.0–10.3)
lymph#: 2 10*3/uL (ref 0.9–3.3)

## 2014-10-19 LAB — MAGNESIUM (CC13): Magnesium: 1.7 mg/dl (ref 1.5–2.5)

## 2014-10-19 MED ORDER — SODIUM CHLORIDE 0.9 % IV SOLN
Freq: Once | INTRAVENOUS | Status: AC
  Administered 2014-10-19: 13:00:00 via INTRAVENOUS

## 2014-10-19 MED ORDER — IMMUNE GLOBULIN (HUMAN) 10 GM/100ML IV SOLN
100.0000 g | Freq: Once | INTRAVENOUS | Status: AC
  Administered 2014-10-19: 100 g via INTRAVENOUS
  Filled 2014-10-19: qty 1000

## 2014-10-19 MED ORDER — HEPARIN SOD (PORK) LOCK FLUSH 100 UNIT/ML IV SOLN
500.0000 [IU] | Freq: Once | INTRAVENOUS | Status: AC | PRN
  Administered 2014-10-19: 500 [IU]
  Filled 2014-10-19: qty 5

## 2014-10-19 MED ORDER — SODIUM CHLORIDE 0.9 % IV SOLN
1.0000 g | Freq: Once | INTRAVENOUS | Status: AC
  Administered 2014-10-19: 1 g via INTRAVENOUS
  Filled 2014-10-19: qty 2

## 2014-10-19 NOTE — Progress Notes (Signed)
ID: [name]   DOB: 05-01-1946  MR#: [id number]  TFT#:[id number]  HCW:CBJS, [name], MD SU: OTHER MD: [name]; [name], MD; [name], [name],MD; [name], MD; [facility] in [place], [place]:  [name], RN [phone])  CHIEF COMPLAINT:  CLL, status post allogeneic stem cell transplant, GVHD CURRENT TREATMENT: Immunosuppression  HISTORY OF CLL: From the original intake note:  We have very complete records from Dr. Racheal Patches in [place], and in summary:  The patient was initially diagnosed in August 2000, with a white cell count of 23,600, but normal hemoglobin and platelets, and no significant symptomatology. Over the next several years his white cell count drifted up, and he eventually developed some symptoms of night sweats in particular, leading to treatment with fludarabine, Cytoxan and rituxan for five cycles given between December 2006 and May 2007.  We have CT scans from June 2006, November 2006 and April 2007, and comparing the November 2006 and April 2007 scans, there was near complete response. He had subsequent therapy in [place] as detailed below, but with decreased response, leading to allogeneic stem-cell transplant at the [facility] 02/24/2012.  Subsequent history is as detailed below.  INTERVAL HISTORY: Timothy [name] returns today with his wife, Timothy [name], for follow up of his chronic lymphoid leukemia. He is due for IVIG today. He also continues magnesium with IV fluids weekly. The interval history is unremarkable. He continues to have 1 full day of loose diarrhea weekly. He takes [place] powder for this. He never dropped off a stool sample for testing of c.diff, stating that he was fairly sure that this is not the culprit.   REVIEW OF SYSTEMS: Rahm denies fevers, chills, nausea, or vomiting. He has no night sweats, or unexplained weight loss. His skin still has the diffuse  "necklace-like" breakout around his upper chest, but this is unchanged. His energy level is up and down. His ankle edema continues but is no worse than previously documented. His quad strength is improving, and he is using a step stool to exercise his left quad specifically. A detailed review of systems is otherwise noncontributory.   PAST MEDICAL HISTORY: Past Medical History  Diagnosis Date  . 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 Diverticular disease   . Hyperlipidemia   . Obesity   . Hypertension   . Hiatal hernia   . CMV (cytomegalovirus) antibody positive     pre-transplant, with seroconversion x2 pst-transplant  . Right bundle branch block     pre-transplant  . CKD (chronic kidney disease) stage 2, GFR 60-89 ml/min   . Pancytopenia   . Steroid-induced diabetes   . Atrial fibrillation     post-transplant  . Myopathy   . 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 Leukemia, chronic lymphoid   . 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 Diarrhea in adult patient 12/05/2012    Due to active GVHD  . CLL (chronic lymphocytic leukemia) 12/05/2012    Dx 07/1999; started Rx 12/06  AlloBMT 3/13  . Rash of face 12/05/2012    Due to GVHD  . Hypomagnesemia 01/26/2013  . Left hip pain 12/01/2013    PAST SURGICAL HISTORY: Past Surgical History  Procedure Laterality Date  . Tonsillectomy and adenoidectomy    . Bone marrow transplant    . Flexible sigmoidoscopy  11/17/2012    Procedure: FLEXIBLE SIGMOIDOSCOPY;  Surgeon: Altamese Dilling  Drema Balzarine, MD;  Location: [facility] END[facility];  Service: Endoscopy;  Laterality: N/A;  Dr Watt Climes states will be admitted to rooom 1339 11/16/12  . Esophagogastroduodenoscopy  11/17/2012    Procedure: ESOPHAGOGASTRODUODENOSCOPY (EGD);  Surgeon: Jeryl Columbia, MD;  Location: [facility];  Service: Endoscopy;  Laterality: N/A;    FAMILY HISTORY Family History  Problem  Relation Age of Onset  . Cancer Father   The patient's father died from complications of chronic lymphocytic leukemia at the age of 57.  It had been diagnosed seven years before when he was 60.  The patient is enrolled in a familial chronic lymphocytic leukemia study out of the [employer].  The patient's mother is 19, alive, unfortunately suffering with dementia, and he has a brother, 70, who is otherwise in fair health.   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  His wife of >40 years, Timothy [name], is a homemaker.  Their daughter, Timothy [name], lives in [place].  She also is a Agricultural engineer.  The patient has an 6 year old grandson and an 22-year-old granddaughter, and that is really the main reason he moved to this area.  He is a Tourist information centre manager.     ADVANCED DIRECTIVES: In place  HEALTH MAINTENANCE: (Updated 04/13/2014) History  Substance Use Topics  . Smoking status: Never Smoker   . Smokeless tobacco: Never Used  . Alcohol Use: No     Colonoscopy: Nov 2013, Dr. Watt Climes  PSA: Not on file  Bone density:  Feb 2014;  Patient also has known insufficiency and pathologic fractures in addition to his long-standing history of steroid use.  Lipid panel: Jan 2015, elevated    Allergies  Allergen Reactions  . Benadryl [Diphenhydramine Hcl]     "Restless leg syndrome"    Current Outpatient Prescriptions  Medication Sig Dispense Refill  . acyclovir (ZOVIRAX) 400 MG tablet Take 2 tablets (800 mg total) by mouth 2 (two) times daily.  120 tablet  3  . budesonide (ENTOCORT EC) 3 MG 24 hr capsule Take 1 capsule (3 mg total) by mouth 3 (three) times daily.  90 capsule  6  . CARTIA XT 240 MG 24 hr capsule TAKE 1 CAPSULE BY MOUTH DAILY  30 capsule  3  . cholestyramine (QUESTRAN) 4  G packet Take 1 packet (4 g total) by mouth daily.  30 each  12  . diltiazem (DILACOR XR) 240 MG 24 hr capsule Take 1 capsule (240 mg total) by mouth every evening.  30 capsule  5  . fluconazole (DIFLUCAN) 100 MG tablet Take 1 tablet (100 mg total) by mouth once.  30 tablet  3  . furosemide (LASIX) 20 MG tablet Take 1 tablet (20 mg total) by mouth daily.  30 tablet  5  . glucose blood (ONE TOUCH ULTRA TEST) test strip Test before meals and at bedtime.  300 each  0  . HYDROcodone-acetaminophen (NORCO) 7.5-325 MG per tablet Take 1 tablet by mouth every 4 (four) hours as needed for moderate pain.  120 tablet  0  . Insulin Aspart Prot & Aspart (NOVOLOG 70/30 MIX) (70-30) 100 UNIT/ML Pen Inject 10 Units into the skin 2 (two) times daily. 10 units with breakfast, 10 units with dinner      . Insulin Pen Needle (B-D UF III MINI PEN NEEDLES) 31G  X 5 MM MISC Use twice daily with insulin as directed  100 each  5  . labetalol (NORMODYNE) 200 MG tablet Take 400 mg by mouth 2 (two) times daily.       Marland Kitchen lidocaine (LIDODERM) 5 % Place 1 patch onto the skin daily. Remove & Discard patch within 12 hours or as directed by MD  30 patch  0  . Lidocaine-Hydrocortisone Ace 3-0.5 % KIT Apply 1 application topically as needed (for pain).  1 each  3  . lidocaine-prilocaine (EMLA) cream Apply 1 application topically as needed. 1-2 hrs before each port access  30 g  6  . lisinopril (PRINIVIL,ZESTRIL) 10 MG tablet Take 10 mg by mouth every evening.       . loratadine (CLARITIN) 10 MG tablet Take 10 mg by mouth daily.      . Multiple Vitamin (MULTIVITAMIN WITH MINERALS) TABS tablet Take 1 tablet by mouth daily.      Marland Kitchen omeprazole (PRILOSEC) 20 MG capsule Take 1 capsule (20 mg total) by mouth daily.  30 capsule  5  . ONETOUCH DELICA LANCETS 29J MISC Test before meals and at bedtime.  300 each  0  . predniSONE (DELTASONE) 5 MG tablet Take 0.5-1 tablets (2.5-5 mg total) by mouth 2 (two) times daily. Takes 43m in the morning and  2.527min the evening.  45 tablet  2  . sertraline (ZOLOFT) 50 MG tablet ALTERNATE TAKING 1 TABLET DAILY AND 2 TABLETS DAILY  90 tablet  5  . sulfamethoxazole-trimethoprim (BACTRIM DS) 800-160 MG per tablet Take 1 tablet by mouth daily.  30 tablet  5  . tacrolimus (PROGRAF) 0.5 MG capsule Take 3 capsules (1.5 mg total) by mouth 2 (two) times daily.  180 capsule  4  . vancomycin (VANCOCIN) 125 MG capsule       . VOLTAREN 1 % GEL Apply 2 g topically 2 (two) times daily. Applied to back      . [DISCONTINUED] insulin aspart (NOVOLOG FLEXPEN) 100 UNIT/ML SOPN FlexPen 18units sq qam, 9units sq qpm, or as directed  15 mL  1   No current facility-administered medications for this visit.   Facility-Administered Medications Ordered in Other Visits  Medication Dose Route Frequency Provider Last Rate Last Dose  . 0.9 %  sodium chloride infusion   Intravenous Continuous Amy G Berry, PA-C 500 mL/hr at 03/12/13 0900    . heparin lock flush 100 unit/mL  500 Units Intracatheter Once PRN GuChauncey CruelMD      . sodium chloride 0.9 % injection 10 mL  10 mL Intravenous PRN GuChauncey CruelMD   10 mL at 08/11/12 1606    OBJECTIVE: Middle-aged white man in no acute distress Filed Vitals:   10/19/14 1136  BP: 146/47  Pulse: 72  Temp: 98.5 F (36.9 C)  Resp: 18  Body mass index is 34.96 kg/(m^2).  ECOG: 1 Filed Weights   10/19/14 1136  Weight: 216 lb 8 oz (98.204 kg)   Skin: warm, dry, stable macular rash around upper neck and chest HEENT: sclerae anicteric, conjunctivae pink, oropharynx clear. No thrush or mucositis.  Lymph Nodes: No cervical or supraclavicular lymphadenopathy  Lungs: clear to auscultation bilaterally, no rales, wheezes, or rhonci  Heart: regular rate and rhythm  Abdomen: round, soft, non tender, positive bowel sounds  Musculoskeletal: No focal spinal tenderness, 1+ edema to bilateral ankles.  Neuro: non focal, well oriented, positive affect   LABS:  CBC    Component Value  Date/Time   WBC 5.3 10/19/2014 1111   WBC 7.1 04/06/2014 0949   RBC 4.22 10/19/2014 1111   RBC 4.06* 04/06/2014 0949   RBC 3.82* 03/16/2013 1400   HGB 12.8* 10/19/2014 1111   HGB 12.2* 04/06/2014 0949   HCT 38.8 10/19/2014 1111   HCT 38.5* 04/06/2014 0949   PLT 138* 10/19/2014 1111   PLT 144* 04/06/2014 0949   MCV 91.9 10/19/2014 1111   MCV 94.8 04/06/2014 0949   MCH 30.3 10/19/2014 1111   MCH 30.0 04/06/2014 0949   MCHC 33.0 10/19/2014 1111   MCHC 31.7 04/06/2014 0949   RDW 14.9* 10/19/2014 1111   RDW 15.7* 04/06/2014 0949   LYMPHSABS 2.0 10/19/2014 1111   LYMPHSABS 1.4 03/18/2013 0615   MONOABS 0.4 10/19/2014 1111   MONOABS 0.3 03/18/2013 0615   EOSABS 0.0 10/19/2014 1111   EOSABS 0.0 03/18/2013 0615   BASOSABS 0.0 10/19/2014 1111   BASOSABS 0.0 03/18/2013 0615       Chemistry      Component Value Date/Time   NA 137 10/19/2014 1111   NA 139 09/07/2014 1324   K 5.2* 10/19/2014 1111   K 4.8 09/07/2014 1324   CL 110 09/07/2014 1324   CL 101 06/15/2013 1034   CO2 18* 10/19/2014 1111   CO2 15* 09/07/2014 1324   BUN 40.3* 10/19/2014 1111   BUN 46* 09/07/2014 1324   CREATININE 1.3 10/19/2014 1111   CREATININE 1.20 09/07/2014 1324      Component Value Date/Time   CALCIUM 9.7 10/19/2014 1111   CALCIUM 9.1 09/07/2014 1324   ALKPHOS 68 10/19/2014 1111   ALKPHOS 80 09/07/2014 1324   AST 20 10/19/2014 1111   AST 40* 09/07/2014 1324   ALT 32 10/19/2014 1111   ALT 55* 09/07/2014 1324   BILITOT 0.31 10/19/2014 1111   BILITOT <0.2* 09/07/2014 1324     STUDIES: No results found.  ASSESSMENT: 68 y.o. [place] man with a history of well-differentiated lymphocytic lymphoma/ chronic lymphoid leukemia initially diagnosed in 2000, not requiring intervention until 2006; with multiple chromosomal abnormalities.  His treatment history is as follows:  (1) fludarabine/cyclophosphamide/rituximab x5 completed May 2007.   (2) rituximab for 8 doses October 2010, with partial response   (3) Leustatin  and ofatumumab weekly x8 July to September 2011 followed by maintenance ofatumumab  every 2 months, with initial response but rising counts September 2012   (4) status-post unrelated donor stem[facility]cell [facility]/24/2012 [facility]  (a) conditioning regimen consisted of fludarabine + TBI at 200 cGy, followed by rituximab x27;  (b) CMV reactivation x3 (patient CMV positive, donor negative), s/p ganciclovir treatment; 3d reactivation August 2013, s/p gancyclovir, with negative PCR mid-September 2013; last gancyclovir dose 10/06/2012 (c) Chronic GVHD: involving gut and skin, treated with steroids, tacrolimus and MMF.  MMF was eventually d/c'd and tacrolimus currently at a dose of 1.31m BID (d) atrial fibrillation: resolved on brief amiodarone regimen (e) steroid-induced myopathy: improving  (f) hypomagnesemia: improved after d/c gancyclovir, needs continuing support (g) hypogammaglobulinemia: requiring IVIG most recently 08/03/2014. (h) history of elevated triglycerides (606 on 07/14/2012)  (i) adrenal insufficiency: on prednisone and budesonide (j) pancytopenia,resolved (k) [place] February 2015, accompanied by diarrhea; resolved   (5) restaging studies February-March 2015 including CT scans, flow cytometry, and bone marrow biopsy, showed no evidence of residual chronic lymphoid leukemia.  (6) recurrent GVHD (skin rash, mouth changes, severe diarrhea and gastric/duodenal/colonic biopsies 11/17/2012 c/w GVHD grade 2) : now grade 1 to  inactive  (7)  malnutrition -- on VITAL supplement in addition to regular diet; on Marinol for anorexia  (8) testosterone deficiency--on patch   (9) deconditioning: Especially quad weakness; continuing rehabilitation exercises  (10) mild dehydration: encouraged increased po fluids; receives IVF support w magnesium weekly  (11) severe steroid-induced osteoporosis with compression fractures: received pamidronate 12/18/2012.  Status post kyphoplasty at L3-4 in June 2014. Also with evidence of rib fractures and insufficiency fractures bilaterally of the sacral  alae, noted by CT in March 2015. --   Denosumab started 12/08/2013, given as prolia Q6 months which is what has been approved by his insurance, most recent dose 03/30/2014  (12) chronic back pain and hip pain controlled with OxyContin and hydrocodone/APAP.  (12) nausea: well controlled on current meds  (13)  Positive c.diff, 03/08/2013, on Flagyl 500 mg TID x 20 days, then on oral vanco with Questran, showing improvement; positive when repeated April 2014; Negative x 3 since then  (14) persistently increased BUN and potassium  (15)  Hypertension, on labetalol, cardizem, lisinopril, and furosemide; managed by Dr. Brigitte Pulse  (16) steroid induced hyperglycemia, on sterlix and 70/30 insulin, followed by Dr. Loanne Drilling and Dr. Brigitte Pulse  (17) hypogammaglobulinemia-- requiring intermittent supplementation  (18) squamous cell CA in situ removed from left parietal scalp October 2014, second lesion to be removed may 2015   PLAN: Kirill is doing well today. The labs were reviewed in detail and were generally stable. He will proceed with IVIG today along with the 500cc NS with magnesium weekly. He will continue prednisone 39m qAM and 2.542mQHS along with prograf 1.5 BID. At this time there are no plans to wean down these doses.   TeMahariill return in 2 weeks for labs and an office visit with Dr. MaJana HakimHe understands and agrees with this plan. He has been encouraged to call with any issues that might arise before his next visit here.   FeMarcelino DusterNP 10/19/2014

## 2014-10-26 ENCOUNTER — Ambulatory Visit (HOSPITAL_BASED_OUTPATIENT_CLINIC_OR_DEPARTMENT_OTHER): Payer: BC Managed Care – PPO

## 2014-10-26 ENCOUNTER — Other Ambulatory Visit (HOSPITAL_BASED_OUTPATIENT_CLINIC_OR_DEPARTMENT_OTHER): Payer: BC Managed Care – PPO

## 2014-10-26 ENCOUNTER — Telehealth: Payer: Self-pay | Admitting: Oncology

## 2014-10-26 ENCOUNTER — Other Ambulatory Visit: Payer: Self-pay | Admitting: *Deleted

## 2014-10-26 DIAGNOSIS — D809 Immunodeficiency with predominantly antibody defects, unspecified: Secondary | ICD-10-CM

## 2014-10-26 DIAGNOSIS — C9110 Chronic lymphocytic leukemia of B-cell type not having achieved remission: Secondary | ICD-10-CM

## 2014-10-26 LAB — CBC WITH DIFFERENTIAL/PLATELET
BASO%: 0 % (ref 0.0–2.0)
BASOS ABS: 0 10*3/uL (ref 0.0–0.1)
EOS ABS: 0 10*3/uL (ref 0.0–0.5)
EOS%: 0.2 % (ref 0.0–7.0)
HEMATOCRIT: 37.1 % — AB (ref 38.4–49.9)
HEMOGLOBIN: 12.4 g/dL — AB (ref 13.0–17.1)
LYMPH%: 30.5 % (ref 14.0–49.0)
MCH: 30.5 pg (ref 27.2–33.4)
MCHC: 33.4 g/dL (ref 32.0–36.0)
MCV: 91.2 fL (ref 79.3–98.0)
MONO#: 0.4 10*3/uL (ref 0.1–0.9)
MONO%: 7.5 % (ref 0.0–14.0)
NEUT%: 61.8 % (ref 39.0–75.0)
NEUTROS ABS: 3 10*3/uL (ref 1.5–6.5)
Platelets: 118 10*3/uL — ABNORMAL LOW (ref 140–400)
RBC: 4.07 10*6/uL — ABNORMAL LOW (ref 4.20–5.82)
RDW: 14.9 % — AB (ref 11.0–14.6)
WBC: 4.8 10*3/uL (ref 4.0–10.3)
lymph#: 1.5 10*3/uL (ref 0.9–3.3)

## 2014-10-26 LAB — COMPREHENSIVE METABOLIC PANEL (CC13)
ALBUMIN: 3.4 g/dL — AB (ref 3.5–5.0)
ALT: 48 U/L (ref 0–55)
AST: 31 U/L (ref 5–34)
Alkaline Phosphatase: 76 U/L (ref 40–150)
Anion Gap: 8 mEq/L (ref 3–11)
BUN: 45.7 mg/dL — AB (ref 7.0–26.0)
CALCIUM: 8.9 mg/dL (ref 8.4–10.4)
CO2: 18 meq/L — AB (ref 22–29)
Chloride: 113 mEq/L — ABNORMAL HIGH (ref 98–109)
Creatinine: 1.4 mg/dL — ABNORMAL HIGH (ref 0.7–1.3)
GLUCOSE: 170 mg/dL — AB (ref 70–140)
POTASSIUM: 5 meq/L (ref 3.5–5.1)
SODIUM: 138 meq/L (ref 136–145)
TOTAL PROTEIN: 6.5 g/dL (ref 6.4–8.3)
Total Bilirubin: 0.31 mg/dL (ref 0.20–1.20)

## 2014-10-26 LAB — MAGNESIUM (CC13): Magnesium: 1.6 mg/dl (ref 1.5–2.5)

## 2014-10-26 MED ORDER — SODIUM CHLORIDE 0.9 % IV SOLN
INTRAVENOUS | Status: DC
  Administered 2014-10-26: 13:00:00 via INTRAVENOUS

## 2014-10-26 MED ORDER — HEPARIN SOD (PORK) LOCK FLUSH 100 UNIT/ML IV SOLN
500.0000 [IU] | Freq: Once | INTRAVENOUS | Status: AC | PRN
  Administered 2014-10-26: 500 [IU]
  Filled 2014-10-26: qty 5

## 2014-10-26 MED ORDER — PROMETHAZINE HCL 25 MG/ML IJ SOLN
12.5000 mg | Freq: Once | INTRAMUSCULAR | Status: DC
  Filled 2014-10-26: qty 1

## 2014-10-26 MED ORDER — ONDANSETRON 8 MG/50ML IVPB (CHCC): 8.0000 mg | Freq: Once | INTRAVENOUS | Status: DC

## 2014-10-26 MED ORDER — HEPARIN SOD (PORK) LOCK FLUSH 100 UNIT/ML IV SOLN
250.0000 [IU] | Freq: Once | INTRAVENOUS | Status: DC | PRN
  Filled 2014-10-26: qty 5

## 2014-10-26 MED ORDER — FUROSEMIDE 20 MG PO TABS: 20.0000 mg | ORAL_TABLET | Freq: Every day | ORAL | Status: DC

## 2014-10-26 MED ORDER — MAGNESIUM SULFATE 50 % IJ SOLN
1.0000 g | Freq: Once | INTRAMUSCULAR | Status: AC
  Administered 2014-10-26: 1 g via INTRAVENOUS
  Filled 2014-10-26: qty 2

## 2014-10-26 MED ORDER — ALTEPLASE 2 MG IJ SOLR
2.0000 mg | Freq: Once | INTRAMUSCULAR | Status: DC | PRN
  Filled 2014-10-26: qty 2

## 2014-10-26 MED ORDER — SODIUM CHLORIDE 0.9 % IJ SOLN
10.0000 mL | INTRAMUSCULAR | Status: DC | PRN
Start: 2014-10-26 — End: 2014-10-26
  Administered 2014-10-26: 10 mL via INTRAVENOUS
  Filled 2014-10-26: qty 10

## 2014-10-26 NOTE — Telephone Encounter (Signed)
Faxed pt medical records to [facility] °

## 2014-10-26 NOTE — Patient Instructions (Signed)
Magnesium Sulfate injection °What is this medicine? °MAGNESIUM SULFATE (mag NEE zee um SUL fate) is an electrolyte injection commonly used to treat low magnesium levels in your blood and to prevent or control certain seizures. °This medicine may be used for other purposes; ask your health care provider or pharmacist if you have questions. °What should I tell my health care provider before I take this medicine? °They need to know if you have any of these conditions: °-heart disease °-history of irregular heart beat °-kidney disease °-an unusual or allergic reaction to magnesium sulfate, medicines, foods, dyes, or preservatives °-pregnant or trying to get pregnant °-breast-feeding °How should I use this medicine? °This medicine is for infusion into a vein. It is given by a health care professional in a hospital or clinic setting. °Talk to your pediatrician regarding the use of this medicine in children. While this drug may be prescribed for selected conditions, precautions do apply. °Overdosage: If you think you have taken too much of this medicine contact a poison control center or emergency room at once. °NOTE: This medicine is only for you. Do not share this medicine with others. °What if I miss a dose? °This does not apply. °What may interact with this medicine? °This medicine may interact with the following medications: °-certain medicines for anxiety or sleep °-certain medicines for seizures like phenobarbital °-digoxin °-medicines that relax muscles for surgery °-narcotic medicines for pain °This list may not describe all possible interactions. Give your health care provider a list of all the medicines, herbs, non-prescription drugs, or dietary supplements you use. Also tell them if you smoke, drink alcohol, or use illegal drugs. Some items may interact with your medicine. °What should I watch for while using this medicine? °Your condition will be monitored carefully while you are receiving this medicine. You  may need blood work done while you are receiving this medicine. °What side effects may I notice from receiving this medicine? °Side effects that you should report to your doctor or health care professional as soon as possible: °-allergic reactions like skin rash, itching or hives, swelling of the face, lips, or tongue °-facial flushing °-muscle weakness °-signs and symptoms of low blood pressure like dizziness; feeling faint or lightheaded, falls; unusually weak or tired °-signs and symptoms of a dangerous change in heartbeat or heart rhythm like chest pain; dizziness; fast or irregular heartbeat; palpitations; breathing problems °-sweating °This list may not describe all possible side effects. Call your doctor for medical advice about side effects. You may report side effects to FDA at 1-800-FDA-1088. °Where should I keep my medicine? °This drug is given in a hospital or clinic and will not be stored at home. °NOTE: This sheet is a summary. It may not cover all possible information. If you have questions about this medicine, talk to your doctor, pharmacist, or health care provider. °© 2015, Elsevier/Gold Standard. (2013-04-18 10:35:11) ° °

## 2014-10-27 LAB — IGG, IGA, IGM
IgG (Immunoglobin G), Serum: 1410 mg/dL (ref 650–1600)
IgM, Serum: <5 mg/dL — ABNORMAL LOW (ref 41–251)

## 2014-10-31 ENCOUNTER — Other Ambulatory Visit: Payer: Self-pay | Admitting: *Deleted

## 2014-10-31 DIAGNOSIS — D849 Immunodeficiency, unspecified: Secondary | ICD-10-CM

## 2014-10-31 DIAGNOSIS — D899 Disorder involving the immune mechanism, unspecified: Principal | ICD-10-CM

## 2014-10-31 DIAGNOSIS — D89811 Chronic graft-versus-host disease: Secondary | ICD-10-CM

## 2014-10-31 DIAGNOSIS — T8609 Other complications of bone marrow transplant: Secondary | ICD-10-CM

## 2014-10-31 MED ORDER — SULFAMETHOXAZOLE-TRIMETHOPRIM 800-160 MG PO TABS: 1.0000 | ORAL_TABLET | Freq: Once | ORAL | Status: DC

## 2014-10-31 MED ORDER — SULFAMETHOXAZOLE-TRIMETHOPRIM 800-160 MG PO TABS: 1.0000 | ORAL_TABLET | Freq: Every day | ORAL | Status: DC

## 2014-11-02 ENCOUNTER — Other Ambulatory Visit: Payer: Self-pay | Admitting: Medical Oncology

## 2014-11-02 ENCOUNTER — Other Ambulatory Visit (HOSPITAL_BASED_OUTPATIENT_CLINIC_OR_DEPARTMENT_OTHER): Payer: BC Managed Care – PPO

## 2014-11-02 ENCOUNTER — Ambulatory Visit: Payer: Self-pay

## 2014-11-02 ENCOUNTER — Ambulatory Visit (HOSPITAL_BASED_OUTPATIENT_CLINIC_OR_DEPARTMENT_OTHER): Payer: BC Managed Care – PPO | Admitting: Oncology

## 2014-11-02 ENCOUNTER — Telehealth: Payer: Self-pay | Admitting: Oncology

## 2014-11-02 ENCOUNTER — Ambulatory Visit (HOSPITAL_BASED_OUTPATIENT_CLINIC_OR_DEPARTMENT_OTHER): Payer: BC Managed Care – PPO

## 2014-11-02 VITALS — BP 156/80 | HR 70 | Temp 98.2°F | Resp 18 | Ht 66.0 in | Wt 213.3 lb

## 2014-11-02 DIAGNOSIS — E860 Dehydration: Secondary | ICD-10-CM

## 2014-11-02 DIAGNOSIS — C9110 Chronic lymphocytic leukemia of B-cell type not having achieved remission: Secondary | ICD-10-CM

## 2014-11-02 DIAGNOSIS — T380X5A Adverse effect of glucocorticoids and synthetic analogues, initial encounter: Secondary | ICD-10-CM

## 2014-11-02 DIAGNOSIS — Z9489 Other transplanted organ and tissue status: Secondary | ICD-10-CM

## 2014-11-02 DIAGNOSIS — D899 Disorder involving the immune mechanism, unspecified: Secondary | ICD-10-CM

## 2014-11-02 DIAGNOSIS — E099 Drug or chemical induced diabetes mellitus without complications: Secondary | ICD-10-CM

## 2014-11-02 DIAGNOSIS — G8929 Other chronic pain: Secondary | ICD-10-CM

## 2014-11-02 DIAGNOSIS — M810 Age-related osteoporosis without current pathological fracture: Secondary | ICD-10-CM

## 2014-11-02 DIAGNOSIS — D849 Immunodeficiency, unspecified: Secondary | ICD-10-CM

## 2014-11-02 DIAGNOSIS — M549 Dorsalgia, unspecified: Principal | ICD-10-CM

## 2014-11-02 DIAGNOSIS — T8609 Other complications of bone marrow transplant: Secondary | ICD-10-CM

## 2014-11-02 DIAGNOSIS — D89811 Chronic graft-versus-host disease: Secondary | ICD-10-CM

## 2014-11-02 LAB — CBC WITH DIFFERENTIAL/PLATELET
BASO%: 0.6 % (ref 0.0–2.0)
Basophils Absolute: 0 10*3/uL (ref 0.0–0.1)
EOS ABS: 0 10*3/uL (ref 0.0–0.5)
EOS%: 0.6 % (ref 0.0–7.0)
HEMATOCRIT: 40.7 % (ref 38.4–49.9)
HEMOGLOBIN: 12.9 g/dL — AB (ref 13.0–17.1)
LYMPH%: 31.2 % (ref 14.0–49.0)
MCH: 29.7 pg (ref 27.2–33.4)
MCHC: 31.7 g/dL — ABNORMAL LOW (ref 32.0–36.0)
MCV: 93.7 fL (ref 79.3–98.0)
MONO#: 0.4 10*3/uL (ref 0.1–0.9)
MONO%: 7.4 % (ref 0.0–14.0)
NEUT#: 3 10*3/uL (ref 1.5–6.5)
NEUT%: 60.2 % (ref 39.0–75.0)
Platelets: 126 10*3/uL — ABNORMAL LOW (ref 140–400)
RBC: 4.34 10*6/uL (ref 4.20–5.82)
RDW: 15.4 % — AB (ref 11.0–14.6)
WBC: 5 10*3/uL (ref 4.0–10.3)
lymph#: 1.6 10*3/uL (ref 0.9–3.3)

## 2014-11-02 LAB — COMPREHENSIVE METABOLIC PANEL (CC13)
ALT: 106 U/L — AB (ref 0–55)
AST: 66 U/L — ABNORMAL HIGH (ref 5–34)
Albumin: 3.6 g/dL (ref 3.5–5.0)
Alkaline Phosphatase: 106 U/L (ref 40–150)
Anion Gap: 7 mEq/L (ref 3–11)
BUN: 52.4 mg/dL — ABNORMAL HIGH (ref 7.0–26.0)
CHLORIDE: 114 meq/L — AB (ref 98–109)
CO2: 19 mEq/L — ABNORMAL LOW (ref 22–29)
CREATININE: 1.4 mg/dL — AB (ref 0.7–1.3)
Calcium: 9.3 mg/dL (ref 8.4–10.4)
Glucose: 167 mg/dl — ABNORMAL HIGH (ref 70–140)
Potassium: 5.1 mEq/L (ref 3.5–5.1)
Sodium: 140 mEq/L (ref 136–145)
Total Bilirubin: 0.29 mg/dL (ref 0.20–1.20)
Total Protein: 6.4 g/dL (ref 6.4–8.3)

## 2014-11-02 LAB — MAGNESIUM (CC13): MAGNESIUM: 1.8 mg/dL (ref 1.5–2.5)

## 2014-11-02 MED ORDER — SODIUM CHLORIDE 0.9 % IV SOLN
1.0000 g | Freq: Once | INTRAVENOUS | Status: AC
  Administered 2014-11-02: 1 g via INTRAVENOUS
  Filled 2014-11-02: qty 2

## 2014-11-02 MED ORDER — SODIUM CHLORIDE 0.9 % IV SOLN
INTRAVENOUS | Status: DC
  Administered 2014-11-02: 14:00:00 via INTRAVENOUS

## 2014-11-02 MED ORDER — HEPARIN SOD (PORK) LOCK FLUSH 100 UNIT/ML IV SOLN
500.0000 [IU] | Freq: Once | INTRAVENOUS | Status: AC | PRN
  Administered 2014-11-02: 500 [IU]
  Filled 2014-11-02: qty 5

## 2014-11-02 MED ORDER — SODIUM CHLORIDE 0.9 % IJ SOLN
10.0000 mL | INTRAMUSCULAR | Status: DC | PRN
  Administered 2014-11-02: 10 mL via INTRAVENOUS
  Filled 2014-11-02: qty 10

## 2014-11-02 MED ORDER — HEPARIN SOD (PORK) LOCK FLUSH 100 UNIT/ML IV SOLN
500.0000 [IU] | Freq: Once | INTRAVENOUS | Status: DC
  Filled 2014-11-02: qty 5

## 2014-11-02 NOTE — Progress Notes (Signed)
ID: Timothy [name]   DOB: [birth year]  MR#: [id number]  UKG#:[id number]  JSE:GBTD, Timothy Saxon, [name] SU: OTHER [name]: Timothy [name]; Timothy Shin, [name]; Timothy [name], Timothy Hatcher,[name]; Timothy Rosenthal, [name]; [facility] in [place], [place]:  Timothy Duval, RN [phone])  CHIEF COMPLAINT:  CLL, status post allogeneic stem cell transplant, GVHD CURRENT TREATMENT: Immunosuppression  HISTORY OF CLL: From the original intake note:  We have very complete records from Timothy [name] in [place], and in summary:  The patient was initially diagnosed in August 2000, with a white cell count of 23,600, but normal hemoglobin and platelets, and no significant symptomatology. Over the next several years his white cell count drifted up, and he eventually developed some symptoms of night sweats in particular, leading to treatment with fludarabine, Cytoxan and rituxan for five cycles given between December 2006 and May 2007.  We have CT scans from June 2006, November 2006 and April 2007, and comparing the November 2006 and April 2007 scans, there was near complete response. He had subsequent therapy in [place] as detailed below, but with decreased response, leading to allogeneic stem-cell transplant at the [facility] 02/24/2012.  Subsequent history is as detailed below.  INTERVAL HISTORY: Timothy [name] returns today with his wife Timothy [name] for follow up of his chronic lymphoid leukemia N graft-versus-host disease. Generally he is doing "good". He feels he was "down" last visit here, although that was not apparent to me at the time. Since then he has been doing some reading and some thinking. He is frustrated that he is not getting better faster, but he is overall making progress, even though very slowly. He is focusing on that.  REVIEW OF SYSTEMS: Timothy [name] me his bowel movements are still on the loose side, but controlled. He knows when they are coming. He had one this  morning, and 1 about 2 AM which was formed. He has some insomnia because of nocturia 3, which is chronic. He understands that he tends to drink more in the afternoon and evening than in the morning. The insomnia makes him feel tired. He continues to do his exercises, mostly using a walker at home, usually a cane when outside. There have been no falls. He tells me he just saw Dr. Brigitte [name], at his insulin dose changed, and was started on Lipitor. Overall a detailed review of systems today was stable and in particular there have been no rash, no fever, no drenching sweats, and no intercurrent infections.  PAST MEDICAL HISTORY: Past Medical History  Diagnosis Date  . 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 Diverticular disease   . Hyperlipidemia   . Obesity   . Hypertension   . Hiatal hernia   . CMV (cytomegalovirus) antibody positive     pre-transplant, with seroconversion x2 pst-transplant  . Right bundle branch block     pre-transplant  . CKD (chronic kidney disease) stage 2, GFR 60-89 ml/min   . Pancytopenia   . Steroid-induced diabetes   . Atrial fibrillation     post-transplant  . Myopathy   . 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 Leukemia, chronic lymphoid   . 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 Diarrhea in adult patient 12/05/2012    Due to active GVHD  . CLL (chronic lymphocytic leukemia) 12/05/2012    Dx 07/1999; started Rx 12/06  AlloBMT 3/13  . Rash of face 12/05/2012  Due to GVHD  . Hypomagnesemia 01/26/2013  . Left hip pain 12/01/2013    PAST SURGICAL HISTORY: Past Surgical History  Procedure Laterality Date  . Tonsillectomy and adenoidectomy    . Bone marrow transplant    . Flexible sigmoidoscopy  11/17/2012    Procedure: FLEXIBLE SIGMOIDOSCOPY;  Surgeon: Timothy [name];  Location: [facility];  Service: Endoscopy;  Laterality: N/A;  Dr Timothy [name] states  will be admitted to rooom 1339 11/16/12  . Esophagogastroduodenoscopy  11/17/2012    Procedure: ESOPHAGOGASTRODUODENOSCOPY (EGD);  Surgeon: Timothy [name];  Location: [facility];  Service: Endoscopy;  Laterality: N/A;    FAMILY HISTORY Family History  Problem Relation Age of Onset  . Cancer Father   The patient's father died from complications of chronic lymphocytic leukemia at the age of 2.  It had been diagnosed seven years before when he was 64.  The patient is enrolled in a familial chronic lymphocytic leukemia study out of the [employer].  The patient's mother is 23, alive, unfortunately suffering with dementia, and he has a brother, 60, who is otherwise in fair health.   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  His wife of >40 years, Timothy [name], is a homemaker.  Their daughter, Timothy [name], lives in [place].  She also is a Agricultural engineer.  The patient has an 7 year old grandson and an 75-year-old granddaughter, and that is really the main reason he moved to this area.  He is a Tourist information centre manager.     ADVANCED DIRECTIVES: In place  HEALTH MAINTENANCE: (Updated 04/13/2014) History  Substance Use Topics  . Smoking status: Never Smoker   . Smokeless tobacco: Never Used  . Alcohol Use: No     Colonoscopy: Nov 2013, Dr. Watt [name]  PSA: Not on file  Bone density:  Feb 2014;  Patient also has known insufficiency and pathologic fractures in addition to his long-standing history of steroid use.  Lipid panel: Jan 2015, elevated    Allergies  Allergen Reactions  . Benadryl [Diphenhydramine Hcl]     "Restless leg syndrome"    Current Outpatient Prescriptions  Medication Sig Dispense Refill  . acyclovir (ZOVIRAX) 400 MG tablet Take 2 tablets (800 mg total) by mouth  2 (two) times daily. 120 tablet 3  . budesonide (ENTOCORT EC) 3 MG 24 hr capsule Take 1 capsule (3 mg total) by mouth 3 (three) times daily. 90 capsule 6  . CARTIA XT 240 MG 24 hr capsule TAKE 1 CAPSULE BY MOUTH DAILY 30 capsule 3  . cholestyramine (QUESTRAN) 4 G packet Take 1 packet (4 g total) by mouth daily. 30 each 12  . diltiazem (DILACOR XR) 240 MG 24 hr capsule Take 1 capsule (240 mg total) by mouth every evening. 30 capsule 5  . fluconazole (DIFLUCAN) 100 MG tablet Take 1 tablet (100 mg total) by mouth once. 30 tablet 3  . furosemide (LASIX) 20 MG tablet Take 1 tablet (20 mg total) by mouth daily. 30 tablet 0  . glucose blood (ONE TOUCH ULTRA TEST) test strip Test before meals and at bedtime. 300 each 0  . HYDROcodone-acetaminophen (NORCO) 7.5-325 MG per tablet Take 1 tablet by mouth every 4 (four) hours as needed for moderate pain. 120 tablet 0  . Insulin Aspart Prot & Aspart (NOVOLOG 70/30 MIX) (70-30)  100 UNIT/ML Pen Inject 10 Units into the skin 2 (two) times daily. 10 units with breakfast, 10 units with dinner    . Insulin Pen Needle (B-D UF III MINI PEN NEEDLES) 31G X 5 MM MISC Use twice daily with insulin as directed 100 each 5  . labetalol (NORMODYNE) 200 MG tablet Take 400 mg by mouth 2 (two) times daily.     Marland Kitchen lidocaine (LIDODERM) 5 % Place 1 patch onto the skin daily. Remove & Discard patch within 12 hours or as directed by [name] 30 patch 0  . Lidocaine-Hydrocortisone Ace 3-0.5 % KIT Apply 1 application topically as needed (for pain). 1 each 3  . lidocaine-prilocaine (EMLA) cream Apply 1 application topically as needed. 1-2 hrs before each port access 30 g 6  . lisinopril (PRINIVIL,ZESTRIL) 10 MG tablet Take 10 mg by mouth every evening.     . loratadine (CLARITIN) 10 MG tablet Take 10 mg by mouth daily.    . Multiple Vitamin (MULTIVITAMIN WITH MINERALS) TABS tablet Take 1 tablet by mouth daily.    Marland Kitchen omeprazole (PRILOSEC) 20 MG capsule Take 1 capsule (20 mg total) by mouth daily. 30  capsule 5  . ONETOUCH DELICA LANCETS 96P MISC Test before meals and at bedtime. 300 each 0  . predniSONE (DELTASONE) 5 MG tablet Take 0.5-1 tablets (2.5-5 mg total) by mouth 2 (two) times daily. Takes $RemoveBefo'5mg'sUOfffMbtsU$  in the morning and 2.$RemoveBefor'5mg'MTENZAlMwwez$  in the evening. 45 tablet 2  . sertraline (ZOLOFT) 50 MG tablet ALTERNATE TAKING 1 TABLET DAILY AND 2 TABLETS DAILY 90 tablet 5  . sulfamethoxazole-trimethoprim (BACTRIM DS,SEPTRA DS) 800-160 MG per tablet Take 1 tablet by mouth daily. 30 tablet 5  . tacrolimus (PROGRAF) 0.5 MG capsule Take 3 capsules (1.5 mg total) by mouth 2 (two) times daily. 180 capsule 4  . vancomycin (VANCOCIN) 125 MG capsule     . VOLTAREN 1 % GEL Apply 2 g topically 2 (two) times daily. Applied to back    . [DISCONTINUED] insulin aspart (NOVOLOG FLEXPEN) 100 UNIT/ML SOPN FlexPen 18units sq qam, 9units sq qpm, or as directed 15 mL 1   No current facility-administered medications for this visit.   Facility-Administered Medications Ordered in Other Visits  Medication Dose Route Frequency Provider Last Rate Last Dose  . 0.9 %  sodium chloride infusion   Intravenous Continuous Amy G Berry, PA-C 500 mL/hr at 03/12/13 0900    . sodium chloride 0.9 % injection 10 mL  10 mL Intravenous PRN Chauncey Cruel, [name]   10 mL at 08/11/12 1606    OBJECTIVE: Middle-aged white man Who appears stated age 87 Vitals:   11/02/14 1248  BP: 156/80  [name]: 70  Temp: 98.2 F (36.8 C)  Resp: 18  Body mass index is 34.44 kg/(m^2).  ECOG: 1 Filed Weights   11/02/14 1248  Weight: 213 lb 4.8 oz (96.752 kg)   Sclerae unicteric, pupils equal and reactive, EOMs intact Oropharynx clear and moist-- no thrush or other lesions No cervical or supraclavicular adenopathy Lungs no rales or rhonchi Heart regular rate and rhythm Abd soft, obese,nontender, positive bowel sounds MSK no focal spinal tenderness, no upper extremity lymphedema; still cannot rise from a sitting position without pushing off, but showing increased  strength on this test. Neuro: nonfocal, well oriented, appropriate affect Skin: No evidence of rash.  LABS:  CBC    Component Value Date/Time   WBC 5.0 11/02/2014 1237   WBC 7.1 04/06/2014 0949   RBC 4.34 11/02/2014 1237  RBC 4.06* 04/06/2014 0949   RBC 3.82* 03/16/2013 1400   HGB 12.9* 11/02/2014 1237   HGB 12.2* 04/06/2014 0949   HCT 40.7 11/02/2014 1237   HCT 38.5* 04/06/2014 0949   PLT 126* 11/02/2014 1237   PLT 144* 04/06/2014 0949   MCV 93.7 11/02/2014 1237   MCV 94.8 04/06/2014 0949   MCH 29.7 11/02/2014 1237   MCH 30.0 04/06/2014 0949   MCHC 31.7* 11/02/2014 1237   MCHC 31.7 04/06/2014 0949   RDW 15.4* 11/02/2014 1237   RDW 15.7* 04/06/2014 0949   LYMPHSABS 1.6 11/02/2014 1237   LYMPHSABS 1.4 03/18/2013 0615   MONOABS 0.4 11/02/2014 1237   MONOABS 0.3 03/18/2013 0615   EOSABS 0.0 11/02/2014 1237   EOSABS 0.0 03/18/2013 0615   BASOSABS 0.0 11/02/2014 1237   BASOSABS 0.0 03/18/2013 0615       Chemistry      Component Value Date/Time   NA 138 10/26/2014 1236   NA 139 09/07/2014 1324   K 5.0 10/26/2014 1236   K 4.8 09/07/2014 1324   CL 110 09/07/2014 1324   CL 101 06/15/2013 1034   CO2 18* 10/26/2014 1236   CO2 15* 09/07/2014 1324   BUN 45.7* 10/26/2014 1236   BUN 46* 09/07/2014 1324   CREATININE 1.4* 10/26/2014 1236   CREATININE 1.20 09/07/2014 1324      Component Value Date/Time   CALCIUM 8.9 10/26/2014 1236   CALCIUM 9.1 09/07/2014 1324   ALKPHOS 76 10/26/2014 1236   ALKPHOS 80 09/07/2014 1324   AST 31 10/26/2014 1236   AST 40* 09/07/2014 1324   ALT 48 10/26/2014 1236   ALT 55* 09/07/2014 1324   BILITOT 0.31 10/26/2014 1236   BILITOT <0.2* 09/07/2014 1324     STUDIES: No results found.   ASSESSMENT: 68 y.o. [place] man with a history of well-differentiated lymphocytic lymphoma/ chronic lymphoid leukemia initially diagnosed in 2000, not requiring intervention until 2006; with multiple chromosomal abnormalities.  His treatment history  is as follows:  (1) fludarabine/cyclophosphamide/rituximab x5 completed May 2007.   (2) rituximab for 8 doses October 2010, with partial response   (3) Leustatin and ofatumumab weekly x8 July to September 2011 followed by maintenance ofatumumab  every 2 months, with initial response but rising counts September 2012   (4) status-post unrelated donor stem-cell transplant [facility]/24/2012 [facility]  (a) conditioning regimen consisted of fludarabine + TBI at 200 cGy, followed by rituximab x27;  (b) CMV reactivation x3 (patient CMV positive, donor negative), s/p ganciclovir treatment; 3d reactivation August 2013, s/p gancyclovir, with negative PCR mid-September 2013; last gancyclovir dose 10/06/2012 (c) Chronic GVHD: involving gut and skin, treated with steroids, tacrolimus and MMF.  MMF was eventually d/c'd and tacrolimus currently at a dose of 1.$RemoveB'5mg'JYoHVwxQ$  BID (d) atrial fibrillation: resolved on brief amiodarone regimen (e) steroid-induced myopathy: improving  (f) hypomagnesemia: improved after d/c gancyclovir, needs continuing support (g) hypogammaglobulinemia: requiring IVIG most recently 08/03/2014. (h) history of elevated triglycerides (606 on 07/14/2012)  (i) adrenal insufficiency: on prednisone and budesonide (j) pancytopenia,resolved (k) brief episode of neutropenia ([place] 300) February 2015, accompanied by diarrhea; resolved   (5) restaging studies February-March 2015 including CT scans, flow cytometry, and bone marrow biopsy, showed no evidence of residual chronic lymphoid leukemia.  (6) recurrent GVHD (skin rash, mouth changes, severe diarrhea and gastric/duodenal/colonic biopsies 11/17/2012 c/w GVHD grade 2) : now grade 1 to inactive  (7)  malnutrition -- on VITAL supplement in addition to regular diet; on Marinol for anorexia  (8)  testosterone deficiency--on patch   (9) deconditioning: Especially quad weakness; continuing rehabilitation exercises  (10) mild dehydration: encouraged  increased po fluids; receives IVF support w magnesium weekly  (11) severe steroid-induced osteoporosis with compression fractures: received pamidronate 12/18/2012. Status post kyphoplasty at L3-4 in June 2014. Also with evidence of rib fractures and insufficiency fractures bilaterally of the sacral  alae, noted by CT in March 2015. --   Denosumab started 12/08/2013, given as prolia Q6 months which is what has been approved by his insurance, most recent dose 10/02/2014  (12) chronic back pain and hip pain controlled with OxyContin and hydrocodone/APAP.  (12) nausea: well controlled on current meds  (13)  Positive c.diff, 03/08/2013, on Flagyl 500 mg TID x 20 days, then on oral vanco with Questran, showing improvement; positive when repeated April 2014; Negative x 3 since then  (14) persistently increased BUN and potassium  (15)  Hypertension, on labetalol, cardizem, lisinopril, and furosemide; managed by Dr. Brigitte [name]  (16) steroid induced hyperglycemia, on sterlix and 70/30 insulin, followed by Dr. Loanne Drilling and Dr. Brigitte [name]  (17) hypogammaglobulinemia-- requiring intermittent supplementation, most recent dose 10/19/2014  (18) squamous cell CA in situ removed from left parietal scalp October 2014, second lesion to be removed may 2015   PLAN: Candido is is generally stable, improving very slowly. He is intellectually very vigorous, doing quite a bit of reading (currently "the M per of all maladies") and of course continues to teach. His graft-versus-host disease is currently controlled. I do not feel comfortable reducing his prednisone or tacrolimus dose at this point. We are concentrating on continuing to improve his functional status chiefly with exercise. Otherwise we will continue to do fluids with magnesium supplementation weekly, and visits every 2 weeks.  I suggested perhaps if he does more drinking in the morning and no drinking at all 3 hours prior to going to bed--he should go to bed Thursday) he  might be able to reduce the number of nocturia episodes. Part of the nocturia may also be due to fluid redistribution when he changes from a vertical to a horizontal position, so something he can do is to watch TV or read while reclining the last couple of hours before going to bed.he will let us know and these simple maneuvers are of any use.  Lindsey has a good understanding of the overall plan. He agrees with it. He we'll call with any problems that may develop before his next visit here. Chauncey Cruel, [name] 11/02/2014

## 2014-11-03 NOTE — Addendum Note (Signed)
Addended by: Laureen Abrahams on: 11/03/2014 07:10 PM   Modules accepted: Medications

## 2014-11-06 ENCOUNTER — Telehealth: Payer: Self-pay | Admitting: *Deleted

## 2014-11-06 NOTE — Telephone Encounter (Signed)
Per staff message and POF I have scheduled appts. Advised scheduler of appts. JMW  

## 2014-11-07 ENCOUNTER — Telehealth: Payer: Self-pay | Admitting: *Deleted

## 2014-11-07 NOTE — Telephone Encounter (Signed)
I have adjsuted 11/27

## 2014-11-09 ENCOUNTER — Other Ambulatory Visit (HOSPITAL_BASED_OUTPATIENT_CLINIC_OR_DEPARTMENT_OTHER): Payer: BC Managed Care – PPO

## 2014-11-09 ENCOUNTER — Telehealth: Payer: Self-pay | Admitting: Oncology

## 2014-11-09 ENCOUNTER — Ambulatory Visit (HOSPITAL_BASED_OUTPATIENT_CLINIC_OR_DEPARTMENT_OTHER): Payer: BC Managed Care – PPO

## 2014-11-09 ENCOUNTER — Other Ambulatory Visit: Payer: Self-pay | Admitting: *Deleted

## 2014-11-09 DIAGNOSIS — C9110 Chronic lymphocytic leukemia of B-cell type not having achieved remission: Secondary | ICD-10-CM

## 2014-11-09 LAB — COMPREHENSIVE METABOLIC PANEL (CC13)
ALK PHOS: 102 U/L (ref 40–150)
ALT: 70 U/L — AB (ref 0–55)
AST: 32 U/L (ref 5–34)
Albumin: 3.5 g/dL (ref 3.5–5.0)
Anion Gap: 9 mEq/L (ref 3–11)
BILIRUBIN TOTAL: 0.29 mg/dL (ref 0.20–1.20)
BUN: 57.1 mg/dL — ABNORMAL HIGH (ref 7.0–26.0)
CO2: 16 mEq/L — ABNORMAL LOW (ref 22–29)
CREATININE: 1.5 mg/dL — AB (ref 0.7–1.3)
Calcium: 8.9 mg/dL (ref 8.4–10.4)
Chloride: 116 mEq/L — ABNORMAL HIGH (ref 98–109)
Glucose: 182 mg/dl — ABNORMAL HIGH (ref 70–140)
Potassium: 5.2 mEq/L — ABNORMAL HIGH (ref 3.5–5.1)
Sodium: 140 mEq/L (ref 136–145)
Total Protein: 6.3 g/dL — ABNORMAL LOW (ref 6.4–8.3)

## 2014-11-09 LAB — CBC WITH DIFFERENTIAL/PLATELET
BASO%: 0.2 % (ref 0.0–2.0)
BASOS ABS: 0 10*3/uL (ref 0.0–0.1)
EOS ABS: 0 10*3/uL (ref 0.0–0.5)
EOS%: 0.4 % (ref 0.0–7.0)
HEMATOCRIT: 38.5 % (ref 38.4–49.9)
HEMOGLOBIN: 12.6 g/dL — AB (ref 13.0–17.1)
LYMPH%: 39.2 % (ref 14.0–49.0)
MCH: 30.2 pg (ref 27.2–33.4)
MCHC: 32.7 g/dL (ref 32.0–36.0)
MCV: 92.3 fL (ref 79.3–98.0)
MONO#: 0.5 10*3/uL (ref 0.1–0.9)
MONO%: 9.7 % (ref 0.0–14.0)
NEUT#: 2.7 10*3/uL (ref 1.5–6.5)
NEUT%: 50.5 % (ref 39.0–75.0)
Platelets: 112 10*3/uL — ABNORMAL LOW (ref 140–400)
RBC: 4.17 10*6/uL — ABNORMAL LOW (ref 4.20–5.82)
RDW: 15.3 % — ABNORMAL HIGH (ref 11.0–14.6)
WBC: 5.3 10*3/uL (ref 4.0–10.3)
lymph#: 2.1 10*3/uL (ref 0.9–3.3)

## 2014-11-09 LAB — MAGNESIUM (CC13): MAGNESIUM: 1.7 mg/dL (ref 1.5–2.5)

## 2014-11-09 MED ORDER — MAGNESIUM SULFATE 50 % IJ SOLN
1.0000 g | Freq: Once | INTRAMUSCULAR | Status: AC
  Administered 2014-11-09: 1 g via INTRAVENOUS
  Filled 2014-11-09: qty 2

## 2014-11-09 NOTE — Telephone Encounter (Signed)
FAXED PT Timothy [name]

## 2014-11-09 NOTE — Patient Instructions (Signed)
Hypomagnesemia Magnesium is a common ion (mineral) in the body which is needed for metabolism. It is about how the body handles food and other chemical reactions necessary for life. Only about 2% of the magnesium in our body is found in the blood. When this is low, it is called hypomagnesemia. The blood will measure only a tiny amount of the magnesium in our body. When it is low in our blood, it does not mean that the whole body supply is low. The normal serum concentration ranges from 1.8-2.5 mEq/L. When the level gets to be less than 1.0 mEq/L, a number of problems begin to happen.  CAUSES   Receiving intravenous fluids without magnesium replacement.  Loss of magnesium from the bowel by nasogastric suction.  Loss of magnesium from nausea and vomiting or severe diarrhea. Any of the inflammatory bowel conditions can cause this.  Abuse of alcohol often leads to low serum magnesium.  An inherited form of magnesium loss happens when the kidneys lose magnesium. This is called familial or primary hypomagnesemia.  Some medications such as diuretics also cause the loss of magnesium. SYMPTOMS  These following problems are worse if the changes in magnesium levels come on suddenly.  Tremor.  Confusion.  Muscle weakness.  Oversensitive to sights and sounds.  Sensitive reflexes.  Depression.  Muscular fibrillations.  Overreactivity of the nerves.  Irritability.  Psychosis.  Spasms of the hand muscles.  Tetany (where the muscles go into uncontrollable spasms). DIAGNOSIS  This condition can be diagnosed by blood tests. TREATMENT   In an emergency, magnesium can be given intravenously (by vein).  If the condition is less worrisome, it can be corrected by diet. High levels of magnesium are found in green leafy vegetables, peas, beans, and nuts among other things. It can also be given through medications by mouth.  If it is being caused by medications, changes can be made.  If  alcohol is a problem, help is available if there are difficulties giving it up. Document Released: 09/03/2005 Document Revised: 04/24/2014 Document Reviewed: 07/28/2008 [employer] Patient Information 2015 [employer]. This information is not intended to replace advice given to you by your health care provider. Make sure you discuss any questions you have with your health care provider.  

## 2014-11-15 ENCOUNTER — Other Ambulatory Visit: Payer: Self-pay | Admitting: Oncology

## 2014-11-15 ENCOUNTER — Other Ambulatory Visit: Payer: Self-pay | Admitting: *Deleted

## 2014-11-15 DIAGNOSIS — D899 Disorder involving the immune mechanism, unspecified: Principal | ICD-10-CM

## 2014-11-15 DIAGNOSIS — C9110 Chronic lymphocytic leukemia of B-cell type not having achieved remission: Secondary | ICD-10-CM

## 2014-11-15 DIAGNOSIS — D89811 Chronic graft-versus-host disease: Secondary | ICD-10-CM

## 2014-11-15 DIAGNOSIS — D849 Immunodeficiency, unspecified: Secondary | ICD-10-CM

## 2014-11-15 MED ORDER — BUDESONIDE 3 MG PO CP24: 3.0000 mg | ORAL_CAPSULE | Freq: Three times a day (TID) | ORAL | Status: DC

## 2014-11-17 ENCOUNTER — Other Ambulatory Visit: Payer: Self-pay | Admitting: *Deleted

## 2014-11-17 ENCOUNTER — Other Ambulatory Visit (HOSPITAL_BASED_OUTPATIENT_CLINIC_OR_DEPARTMENT_OTHER): Payer: BC Managed Care – PPO

## 2014-11-17 ENCOUNTER — Other Ambulatory Visit: Payer: Self-pay

## 2014-11-17 ENCOUNTER — Ambulatory Visit (HOSPITAL_BASED_OUTPATIENT_CLINIC_OR_DEPARTMENT_OTHER): Payer: BC Managed Care – PPO | Admitting: Nurse Practitioner

## 2014-11-17 ENCOUNTER — Encounter: Payer: Self-pay | Admitting: Nurse Practitioner

## 2014-11-17 ENCOUNTER — Ambulatory Visit (HOSPITAL_BASED_OUTPATIENT_CLINIC_OR_DEPARTMENT_OTHER): Payer: BC Managed Care – PPO

## 2014-11-17 VITALS — BP 132/73 | HR 74 | Temp 98.7°F | Resp 18 | Ht 66.0 in | Wt 215.1 lb

## 2014-11-17 DIAGNOSIS — E860 Dehydration: Secondary | ICD-10-CM

## 2014-11-17 DIAGNOSIS — R739 Hyperglycemia, unspecified: Secondary | ICD-10-CM

## 2014-11-17 DIAGNOSIS — D801 Nonfamilial hypogammaglobulinemia: Secondary | ICD-10-CM

## 2014-11-17 DIAGNOSIS — C9110 Chronic lymphocytic leukemia of B-cell type not having achieved remission: Secondary | ICD-10-CM

## 2014-11-17 DIAGNOSIS — E875 Hyperkalemia: Secondary | ICD-10-CM

## 2014-11-17 DIAGNOSIS — T8609 Other complications of bone marrow transplant: Secondary | ICD-10-CM

## 2014-11-17 DIAGNOSIS — D89811 Chronic graft-versus-host disease: Secondary | ICD-10-CM

## 2014-11-17 LAB — COMPREHENSIVE METABOLIC PANEL (CC13)
ALT: 42 U/L (ref 0–55)
AST: 30 U/L (ref 5–34)
Albumin: 3.5 g/dL (ref 3.5–5.0)
Alkaline Phosphatase: 94 U/L (ref 40–150)
Anion Gap: 9 mEq/L (ref 3–11)
BUN: 45.6 mg/dL — ABNORMAL HIGH (ref 7.0–26.0)
CALCIUM: 9.1 mg/dL (ref 8.4–10.4)
CO2: 17 meq/L — AB (ref 22–29)
CREATININE: 1.5 mg/dL — AB (ref 0.7–1.3)
Chloride: 113 mEq/L — ABNORMAL HIGH (ref 98–109)
GLUCOSE: 166 mg/dL — AB (ref 70–140)
POTASSIUM: 5.4 meq/L — AB (ref 3.5–5.1)
Sodium: 139 mEq/L (ref 136–145)
TOTAL PROTEIN: 6.1 g/dL — AB (ref 6.4–8.3)
Total Bilirubin: 0.28 mg/dL (ref 0.20–1.20)

## 2014-11-17 LAB — CBC WITH DIFFERENTIAL/PLATELET
BASO%: 0 % (ref 0.0–2.0)
BASOS ABS: 0 10*3/uL (ref 0.0–0.1)
EOS%: 0.4 % (ref 0.0–7.0)
Eosinophils Absolute: 0 10*3/uL (ref 0.0–0.5)
HCT: 39.2 % (ref 38.4–49.9)
HEMOGLOBIN: 12.8 g/dL — AB (ref 13.0–17.1)
LYMPH%: 33.2 % (ref 14.0–49.0)
MCH: 30.1 pg (ref 27.2–33.4)
MCHC: 32.7 g/dL (ref 32.0–36.0)
MCV: 92.2 fL (ref 79.3–98.0)
MONO#: 0.4 10*3/uL (ref 0.1–0.9)
MONO%: 7.7 % (ref 0.0–14.0)
NEUT#: 3.3 10*3/uL (ref 1.5–6.5)
NEUT%: 58.7 % (ref 39.0–75.0)
PLATELETS: 105 10*3/uL — AB (ref 140–400)
RBC: 4.25 10*6/uL (ref 4.20–5.82)
RDW: 15.1 % — ABNORMAL HIGH (ref 11.0–14.6)
WBC: 5.6 10*3/uL (ref 4.0–10.3)
lymph#: 1.9 10*3/uL (ref 0.9–3.3)

## 2014-11-17 LAB — MAGNESIUM (CC13): Magnesium: 1.8 mg/dl (ref 1.5–2.5)

## 2014-11-17 MED ORDER — SODIUM CHLORIDE 0.9 % IJ SOLN
10.0000 mL | Freq: Once | INTRAMUSCULAR | Status: AC
  Administered 2014-11-17: 10 mL
  Filled 2014-11-17: qty 10

## 2014-11-17 MED ORDER — SODIUM CHLORIDE 0.9 % IV SOLN: INTRAVENOUS | Status: DC

## 2014-11-17 MED ORDER — HEPARIN SOD (PORK) LOCK FLUSH 100 UNIT/ML IV SOLN
500.0000 [IU] | Freq: Once | INTRAVENOUS | Status: AC | PRN
  Administered 2014-11-17: 500 [IU]
  Filled 2014-11-17: qty 5

## 2014-11-17 MED ORDER — SODIUM CHLORIDE 0.9 % IV SOLN
Freq: Once | INTRAVENOUS | Status: AC
  Administered 2014-11-17: 15:00:00 via INTRAVENOUS

## 2014-11-17 MED ORDER — SODIUM CHLORIDE 0.9 % IV SOLN
1.0000 g | Freq: Once | INTRAVENOUS | Status: AC
  Administered 2014-11-17: 1 g via INTRAVENOUS
  Filled 2014-11-17: qty 2

## 2014-11-17 NOTE — Patient Instructions (Signed)
Magnesium Sulfate injection °What is this medicine? °MAGNESIUM SULFATE (mag NEE zee um SUL fate) is an electrolyte injection commonly used to treat low magnesium levels in your blood and to prevent or control certain seizures. °This medicine may be used for other purposes; ask your health care provider or pharmacist if you have questions. °What should I tell my health care provider before I take this medicine? °They need to know if you have any of these conditions: °-heart disease °-history of irregular heart beat °-kidney disease °-an unusual or allergic reaction to magnesium sulfate, medicines, foods, dyes, or preservatives °-pregnant or trying to get pregnant °-breast-feeding °How should I use this medicine? °This medicine is for infusion into a vein. It is given by a health care professional in a hospital or clinic setting. °Talk to your pediatrician regarding the use of this medicine in children. While this drug may be prescribed for selected conditions, precautions do apply. °Overdosage: If you think you have taken too much of this medicine contact a poison control center or emergency room at once. °NOTE: This medicine is only for you. Do not share this medicine with others. °What if I miss a dose? °This does not apply. °What may interact with this medicine? °This medicine may interact with the following medications: °-certain medicines for anxiety or sleep °-certain medicines for seizures like phenobarbital °-digoxin °-medicines that relax muscles for surgery °-narcotic medicines for pain °This list may not describe all possible interactions. Give your health care provider a list of all the medicines, herbs, non-prescription drugs, or dietary supplements you use. Also tell them if you smoke, drink alcohol, or use illegal drugs. Some items may interact with your medicine. °What should I watch for while using this medicine? °Your condition will be monitored carefully while you are receiving this medicine. You  may need blood work done while you are receiving this medicine. °What side effects may I notice from receiving this medicine? °Side effects that you should report to your doctor or health care professional as soon as possible: °-allergic reactions like skin rash, itching or hives, swelling of the face, lips, or tongue °-facial flushing °-muscle weakness °-signs and symptoms of low blood pressure like dizziness; feeling faint or lightheaded, falls; unusually weak or tired °-signs and symptoms of a dangerous change in heartbeat or heart rhythm like chest pain; dizziness; fast or irregular heartbeat; palpitations; breathing problems °-sweating °This list may not describe all possible side effects. Call your doctor for medical advice about side effects. You may report side effects to FDA at 1-800-FDA-1088. °Where should I keep my medicine? °This drug is given in a hospital or clinic and will not be stored at home. °NOTE: This sheet is a summary. It may not cover all possible information. If you have questions about this medicine, talk to your doctor, pharmacist, or health care provider. °© 2015, Elsevier/Gold Standard. (2013-04-18 10:35:11) ° °

## 2014-11-17 NOTE — Progress Notes (Signed)
ID: Timothy [name]   DOB: [birth year]  MR#: [id number]  EUM#:[id number]  VQM:GQQP, [name], MD SU: OTHER MD: [name]; [name], MD; [name], [name],MD; [name], MD; [facility] in [place], [place]:  [name], RN [phone])  CHIEF COMPLAINT:  CLL, status post allogeneic stem cell transplant, GVHD CURRENT TREATMENT: Immunosuppression  HISTORY OF CLL: From the original intake note:  We have very complete records from Dr. Racheal Patches in [place], and in summary:  The patient was initially diagnosed in August 2000, with a white cell count of 23,600, but normal hemoglobin and platelets, and no significant symptomatology. Over the next several years his white cell count drifted up, and he eventually developed some symptoms of night sweats in particular, leading to treatment with fludarabine, Cytoxan and rituxan for five cycles given between December 2006 and May 2007.  We have CT scans from June 2006, November 2006 and April 2007, and comparing the November 2006 and April 2007 scans, there was near complete response. He had subsequent therapy in [place] as detailed below, but with decreased response, leading to allogeneic stem-cell transplant at the [facility] 02/24/2012.  Subsequent history is as detailed below.  INTERVAL HISTORY: Abner returns today with his wife [name] for follow up of his chronic lymphoid leukemia and graft-versus-host disease. Jaan is doing well today. He bumped his right arm on some furniture and now has a skin tear to his right forearm, which is covered now but healing well. He continues to have loose bowel movements, but they are controlled with questran and imodium. He has not had any accidents and feels like he is in control of when he goes. He took Dr. Virgie Dad advice to stop drinking liquids late into the evening and has seen an improvement with his nocturia. His nephrologist is  starting him on sodium bicarbonate as his "blood is becoming too acidic," and he was a;so advised not to begin the lipitor ordered by his PCP for fear of aggravating leg muscle tissue.  REVIEW OF SYSTEMS: Riyansh denies fevers, chills, nausea, or vomiting. He has no unexplained bleeding, shortness of breath, chest pain, cough, or palpitations. He has no new rashes, night sweats, or recent infections. He is gaining strength in his quads, but continues to use accessories like a walker and cane while at home. A detailed review of systems is otherwise noncontributory.   PAST MEDICAL HISTORY: Past Medical History  Diagnosis Date  . 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 Diverticular disease   . Hyperlipidemia   . Obesity   . Hypertension   . Hiatal hernia   . CMV (cytomegalovirus) antibody positive     pre-transplant, with seroconversion x2 pst-transplant  . Right bundle branch block     pre-transplant  . CKD (chronic kidney disease) stage 2, GFR 60-89 ml/min   . Pancytopenia   . Steroid-induced diabetes   . Atrial fibrillation     post-transplant  . Myopathy   . 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 Leukemia, chronic lymphoid   . 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 Diarrhea in adult patient 12/05/2012    Due to active GVHD  . CLL (chronic lymphocytic leukemia) 12/05/2012    Dx 07/1999; started Rx 12/06  AlloBMT 3/13  . Rash of face 12/05/2012    Due to GVHD  . Hypomagnesemia 01/26/2013  . Left hip  pain 12/01/2013    PAST SURGICAL HISTORY: Past Surgical History  Procedure Laterality Date  . Tonsillectomy and adenoidectomy    . Bone marrow transplant    . Flexible sigmoidoscopy  11/17/2012    Procedure: FLEXIBLE SIGMOIDOSCOPY;  Surgeon: Jeryl Columbia, MD;  Location: [facility];  Service: Endoscopy;  Laterality: N/A;  Dr Watt Climes states will be admitted to rooom 1339  11/16/12  . Esophagogastroduodenoscopy  11/17/2012    Procedure: ESOPHAGOGASTRODUODENOSCOPY (EGD);  Surgeon: Jeryl Columbia, MD;  Location: [facility];  Service: Endoscopy;  Laterality: N/A;    FAMILY HISTORY Family History  Problem Relation Age of Onset  . Cancer Father   The patient's father died from complications of chronic lymphocytic leukemia at the age of 23.  It had been diagnosed seven years before when he was 61.  The patient is enrolled in a familial chronic lymphocytic leukemia study out of the [employer].  The patient's mother is 42, alive, unfortunately suffering with dementia, and he has a brother, 84, who is otherwise in fair health.   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  His wife of >40 years, [name], is a homemaker.  Their daughter, [name], lives in [place].  She also is a Agricultural engineer.  The patient has an 39 year old grandson and an 42-year-old granddaughter, and that is really the main reason he moved to this area.  He is a Tourist information centre manager.     ADVANCED DIRECTIVES: In place  HEALTH MAINTENANCE: (Updated 04/13/2014) History  Substance Use Topics  . Smoking status: Never Smoker   . Smokeless tobacco: Never Used  . Alcohol Use: No     Colonoscopy: Nov 2013, Dr. Watt Climes  PSA: Not on file  Bone density:  Feb 2014;  Patient also has known insufficiency and pathologic fractures in addition to his long-standing history of steroid use.  Lipid panel: Jan 2015, elevated    Allergies  Allergen Reactions  . Benadryl [Diphenhydramine Hcl]     "Restless leg syndrome"    Current Outpatient Prescriptions  Medication Sig Dispense Refill  . acyclovir (ZOVIRAX) 400 MG tablet Take 2 tablets (800 mg total) by mouth 2 (two) times daily. 120 tablet  3  . budesonide (ENTOCORT EC) 3 MG 24 hr capsule Take 1 capsule (3 mg total) by mouth 3 (three) times daily. 90 capsule 6  . CARTIA XT 240 MG 24 hr capsule TAKE 1 CAPSULE BY MOUTH DAILY 30 capsule 3  . cholestyramine (QUESTRAN) 4 G packet Take 1 packet (4 g total) by mouth daily. 30 each 12  . diltiazem (DILACOR XR) 240 MG 24 hr capsule Take 1 capsule (240 mg total) by mouth every evening. 30 capsule 5  . fluconazole (DIFLUCAN) 100 MG tablet Take 1 tablet (100 mg total) by mouth once. 30 tablet 3  . furosemide (LASIX) 20 MG tablet TAKE 1 TABLET BY MOUTH DAILY. 30 tablet 1  . glucose blood (ONE TOUCH ULTRA TEST) test strip Test before meals and at bedtime. 300 each 0  . HYDROcodone-acetaminophen (NORCO) 7.5-325 MG per tablet Take 1 tablet by mouth every 4 (four) hours as needed for moderate pain. 120 tablet 0  . Insulin Aspart Prot & Aspart (NOVOLOG 70/30 MIX) (70-30) 100 UNIT/ML Pen Inject 10 Units into the skin 2 (two) times daily. 12  units with breakfast, 12 units with dinner    . Insulin Pen Needle (B-D UF III MINI PEN NEEDLES) 31G X 5 MM MISC Use twice daily with insulin as directed 100 each 5  . labetalol (NORMODYNE) 200 MG tablet Take 400 mg by mouth 2 (two) times daily.     Marland Kitchen lidocaine (LIDODERM) 5 % Place 1 patch onto the skin daily. Remove & Discard patch within 12 hours or as directed by MD 30 patch 0  . Lidocaine-Hydrocortisone Ace 3-0.5 % KIT Apply 1 application topically as needed (for pain). 1 each 3  . lidocaine-prilocaine (EMLA) cream Apply 1 application topically as needed. 1-2 hrs before each port access 30 g 6  . lisinopril (PRINIVIL,ZESTRIL) 10 MG tablet Take 10 mg by mouth every evening.     . loratadine (CLARITIN) 10 MG tablet Take 10 mg by mouth daily.    . Multiple Vitamin (MULTIVITAMIN WITH MINERALS) TABS tablet Take 1 tablet by mouth daily.    Marland Kitchen omeprazole (PRILOSEC) 20 MG capsule Take 1 capsule (20 mg total) by mouth daily. 30 capsule 5  . ONETOUCH DELICA LANCETS 82U  MISC Test before meals and at bedtime. 300 each 0  . predniSONE (DELTASONE) 5 MG tablet Take 0.5-1 tablets (2.5-5 mg total) by mouth 2 (two) times daily. Takes $RemoveBefo'5mg'uDdTIzjuftF$  in the morning and 2.$RemoveBefor'5mg'EgzDZqXktdBz$  in the evening. 45 tablet 2  . sertraline (ZOLOFT) 50 MG tablet ALTERNATE TAKING 1 TABLET DAILY AND 2 TABLETS DAILY 90 tablet 5  . tacrolimus (PROGRAF) 0.5 MG capsule Take 3 capsules (1.5 mg total) by mouth 2 (two) times daily. 180 capsule 4  . vancomycin (VANCOCIN) 125 MG capsule     . VOLTAREN 1 % GEL Apply 2 g topically 2 (two) times daily. Applied to back    . atorvastatin (LIPITOR) 10 MG tablet Take 10 mg by mouth daily.    Marland Kitchen sulfamethoxazole-trimethoprim (BACTRIM DS,SEPTRA DS) 800-160 MG per tablet Take 1 tablet by mouth daily. 30 tablet 5  . [DISCONTINUED] insulin aspart (NOVOLOG FLEXPEN) 100 UNIT/ML SOPN FlexPen 18units sq qam, 9units sq qpm, or as directed 15 mL 1   No current facility-administered medications for this visit.   Facility-Administered Medications Ordered in Other Visits  Medication Dose Route Frequency Provider Last Rate Last Dose  . 0.9 %  sodium chloride infusion   Intravenous Continuous Amy G Berry, PA-C 500 mL/hr at 03/12/13 0900    . 0.9 %  sodium chloride infusion   Intravenous Continuous Chauncey Cruel, MD      . heparin lock flush 100 unit/mL  500 Units Intracatheter Once PRN Chauncey Cruel, MD      . sodium chloride 0.9 % injection 10 mL  10 mL Intravenous PRN Chauncey Cruel, MD   10 mL at 08/11/12 1606    OBJECTIVE: Middle-aged white man Who appears stated age 12 Vitals:   11/17/14 1353  BP: 132/73  Pulse: 74  Temp: 98.7 F (37.1 C)  Resp: 18  Body mass index is 34.73 kg/(m^2).  ECOG: 1 Filed Weights   11/17/14 1353  Weight: 215 lb 1.6 oz (97.569 kg)    Skin: warm, dry, right forearm skin tear, healing HEENT: sclerae anicteric, conjunctivae pink, oropharynx clear. No thrush or mucositis.  Lymph Nodes: No cervical or supraclavicular lymphadenopathy   Lungs: clear to auscultation bilaterally, no rales, wheezes, or rhonci  Heart: regular rate and rhythm  Abdomen: round, soft, non tender, positive bowel sounds  Musculoskeletal: No focal spinal tenderness, no  peripheral edema  Neuro: non focal, well oriented, positive affect    LABS:  CBC    Component Value Date/Time   WBC 5.6 11/17/2014 1322   WBC 7.1 04/06/2014 0949   RBC 4.25 11/17/2014 1322   RBC 4.06* 04/06/2014 0949   RBC 3.82* 03/16/2013 1400   HGB 12.8* 11/17/2014 1322   HGB 12.2* 04/06/2014 0949   HCT 39.2 11/17/2014 1322   HCT 38.5* 04/06/2014 0949   PLT 105* 11/17/2014 1322   PLT 144* 04/06/2014 0949   MCV 92.2 11/17/2014 1322   MCV 94.8 04/06/2014 0949   MCH 30.1 11/17/2014 1322   MCH 30.0 04/06/2014 0949   MCHC 32.7 11/17/2014 1322   MCHC 31.7 04/06/2014 0949   RDW 15.1* 11/17/2014 1322   RDW 15.7* 04/06/2014 0949   LYMPHSABS 1.9 11/17/2014 1322   LYMPHSABS 1.4 03/18/2013 0615   MONOABS 0.4 11/17/2014 1322   MONOABS 0.3 03/18/2013 0615   EOSABS 0.0 11/17/2014 1322   EOSABS 0.0 03/18/2013 0615   BASOSABS 0.0 11/17/2014 1322   BASOSABS 0.0 03/18/2013 0615       Chemistry      Component Value Date/Time   NA 139 11/17/2014 1323   NA 139 09/07/2014 1324   K 5.4* 11/17/2014 1323   K 4.8 09/07/2014 1324   CL 110 09/07/2014 1324   CL 101 06/15/2013 1034   CO2 17* 11/17/2014 1323   CO2 15* 09/07/2014 1324   BUN 45.6* 11/17/2014 1323   BUN 46* 09/07/2014 1324   CREATININE 1.5* 11/17/2014 1323   CREATININE 1.20 09/07/2014 1324      Component Value Date/Time   CALCIUM 9.1 11/17/2014 1323   CALCIUM 9.1 09/07/2014 1324   ALKPHOS 94 11/17/2014 1323   ALKPHOS 80 09/07/2014 1324   AST 30 11/17/2014 1323   AST 40* 09/07/2014 1324   ALT 42 11/17/2014 1323   ALT 55* 09/07/2014 1324   BILITOT 0.28 11/17/2014 1323   BILITOT <0.2* 09/07/2014 1324     STUDIES: No results found.   ASSESSMENT: 68 y.o. [place] man with a history of well-differentiated  lymphocytic lymphoma/ chronic lymphoid leukemia initially diagnosed in 2000, not requiring intervention until 2006; with multiple chromosomal abnormalities.  His treatment history is as follows:  (1) fludarabine/cyclophosphamide/rituximab x5 completed May 2007.   (2) rituximab for 8 doses October 2010, with partial response   (3) Leustatin and ofatumumab weekly x8 July to September 2011 followed by maintenance ofatumumab  every 2 months, with initial response but rising counts September 2012   (4) status-post unrelated donor stem-cell transplant [facility]/24/2012 [facility]  (a) conditioning regimen consisted of fludarabine + TBI at 200 cGy, followed by rituximab x27;  (b) CMV reactivation x3 (patient CMV positive, donor negative), s/p ganciclovir treatment; 3d reactivation August 2013, s/p gancyclovir, with negative PCR mid-September 2013; last gancyclovir dose 10/06/2012 (c) Chronic GVHD: involving gut and skin, treated with steroids, tacrolimus and MMF.  MMF was eventually d/c'd and tacrolimus currently at a dose of 1.$RemoveB'5mg'cgNtIZkh$  BID (d) atrial fibrillation: resolved on brief amiodarone regimen (e) steroid-induced myopathy: improving  (f) hypomagnesemia: improved after d/c gancyclovir, needs continuing support (g) hypogammaglobulinemia: requiring IVIG most recently 08/03/2014. (h) history of elevated triglycerides (606 on 07/14/2012)  (i) adrenal insufficiency: on prednisone and budesonide (j) pancytopenia,resolved (k) brief episode of neutropenia ([place] 300) February 2015, accompanied by diarrhea; resolved   (5) restaging studies February-March 2015 including CT scans, flow cytometry, and bone marrow biopsy, showed no evidence of residual chronic lymphoid leukemia.  (  6) recurrent GVHD (skin rash, mouth changes, severe diarrhea and gastric/duodenal/colonic biopsies 11/17/2012 c/w GVHD grade 2) : now grade 1 to inactive  (7)  malnutrition -- on VITAL supplement in addition to regular diet; on Marinol  for anorexia  (8) testosterone deficiency--on patch   (9) deconditioning: Especially quad weakness; continuing rehabilitation exercises  (10) mild dehydration: encouraged increased po fluids; receives IVF support w magnesium weekly  (11) severe steroid-induced osteoporosis with compression fractures: received pamidronate 12/18/2012. Status post kyphoplasty at L3-4 in June 2014. Also with evidence of rib fractures and insufficiency fractures bilaterally of the sacral  alae, noted by CT in March 2015. --   Denosumab started 12/08/2013, given as prolia Q6 months which is what has been approved by his insurance, most recent dose 10/02/2014  (12) chronic back pain and hip pain controlled with OxyContin and hydrocodone/APAP.  (12) nausea: well controlled on current meds  (13)  Positive c.diff, 03/08/2013, on Flagyl 500 mg TID x 20 days, then on oral vanco with Questran, showing improvement; positive when repeated April 2014; Negative x 3 since then  (14) persistently increased BUN and potassium  (15)  Hypertension, on labetalol, cardizem, lisinopril, and furosemide; managed by Dr. Brigitte Pulse  (16) steroid induced hyperglycemia, on sterlix and 70/30 insulin, followed by Dr. Loanne Drilling and Dr. Brigitte Pulse  (17) hypogammaglobulinemia-- requiring intermittent supplementation, most recent dose 10/19/2014  (18) squamous cell CA in situ removed from left parietal scalp October 2014, second lesion to be removed may 2015   PLAN: Nox is doing well today. The labs were reviewed in detail and his potassium has risen to 5.4.  While he has had hyperkalemia in the past,Kate believes this particular rise is diet related. Yesterday for thanksgiving he had lots of potatoes and green leafy vegetables which are abundant in potassium. The rest of the labs, while many out of range, were stable. His graft-versus-host disease is well controlled at this time, and no modifications to his prednisone or tacrolimus at this point ill be  made.   Hamdi will continue with his fluids with magnesium weekly and his visit every 2 weeks. He understands and agrees with this plan. He has has been encouraged to call with any issues that might arise before his next visit here.  [name], NP 11/17/2014

## 2014-11-23 ENCOUNTER — Ambulatory Visit (HOSPITAL_BASED_OUTPATIENT_CLINIC_OR_DEPARTMENT_OTHER): Payer: BC Managed Care – PPO

## 2014-11-23 ENCOUNTER — Other Ambulatory Visit (HOSPITAL_BASED_OUTPATIENT_CLINIC_OR_DEPARTMENT_OTHER): Payer: BC Managed Care – PPO

## 2014-11-23 DIAGNOSIS — C9110 Chronic lymphocytic leukemia of B-cell type not having achieved remission: Secondary | ICD-10-CM

## 2014-11-23 DIAGNOSIS — T8609 Other complications of bone marrow transplant: Principal | ICD-10-CM

## 2014-11-23 DIAGNOSIS — D89811 Chronic graft-versus-host disease: Secondary | ICD-10-CM

## 2014-11-23 LAB — COMPREHENSIVE METABOLIC PANEL (CC13)
ALBUMIN: 3.6 g/dL (ref 3.5–5.0)
ALK PHOS: 82 U/L (ref 40–150)
ALT: 40 U/L (ref 0–55)
AST: 30 U/L (ref 5–34)
Anion Gap: 8 mEq/L (ref 3–11)
BUN: 46.1 mg/dL — ABNORMAL HIGH (ref 7.0–26.0)
CO2: 19 mEq/L — ABNORMAL LOW (ref 22–29)
Calcium: 9.3 mg/dL (ref 8.4–10.4)
Chloride: 112 mEq/L — ABNORMAL HIGH (ref 98–109)
Creatinine: 1.4 mg/dL — ABNORMAL HIGH (ref 0.7–1.3)
EGFR: 52 mL/min/{1.73_m2} — ABNORMAL LOW (ref >90)
Glucose: 167 mg/dl — ABNORMAL HIGH (ref 70–140)
POTASSIUM: 5.3 meq/L — AB (ref 3.5–5.1)
SODIUM: 139 meq/L (ref 136–145)
TOTAL PROTEIN: 6.1 g/dL — AB (ref 6.4–8.3)
Total Bilirubin: 0.3 mg/dL (ref 0.20–1.20)

## 2014-11-23 LAB — CBC WITH DIFFERENTIAL/PLATELET
BASO%: 0 % (ref 0.0–2.0)
Basophils Absolute: 0 10*3/uL (ref 0.0–0.1)
EOS%: 0.4 % (ref 0.0–7.0)
Eosinophils Absolute: 0 10*3/uL (ref 0.0–0.5)
HCT: 40 % (ref 38.4–49.9)
HGB: 13.1 g/dL (ref 13.0–17.1)
LYMPH%: 38.2 % (ref 14.0–49.0)
MCH: 30.1 pg (ref 27.2–33.4)
MCHC: 32.8 g/dL (ref 32.0–36.0)
MCV: 92 fL (ref 79.3–98.0)
MONO#: 0.5 10*3/uL (ref 0.1–0.9)
MONO%: 9.2 % (ref 0.0–14.0)
NEUT%: 52.2 % (ref 39.0–75.0)
NEUTROS ABS: 2.9 10*3/uL (ref 1.5–6.5)
Platelets: 110 10*3/uL — ABNORMAL LOW (ref 140–400)
RBC: 4.35 10*6/uL (ref 4.20–5.82)
RDW: 15.2 % — AB (ref 11.0–14.6)
WBC: 5.5 10*3/uL (ref 4.0–10.3)
lymph#: 2.1 10*3/uL (ref 0.9–3.3)

## 2014-11-23 LAB — MAGNESIUM (CC13): Magnesium: 1.9 mg/dl (ref 1.5–2.5)

## 2014-11-23 MED ORDER — SODIUM CHLORIDE 0.9 % IV SOLN
1.0000 g | Freq: Once | INTRAVENOUS | Status: AC
  Administered 2014-11-23: 1 g via INTRAVENOUS
  Filled 2014-11-23: qty 2

## 2014-11-23 MED ORDER — SODIUM CHLORIDE 0.9 % IV SOLN
INTRAVENOUS | Status: DC
  Administered 2014-11-23 (×3): via INTRAVENOUS

## 2014-11-23 MED ORDER — HEPARIN SOD (PORK) LOCK FLUSH 100 UNIT/ML IV SOLN
500.0000 [IU] | Freq: Once | INTRAVENOUS | Status: AC
  Administered 2014-11-23: 500 [IU] via INTRAVENOUS
  Filled 2014-11-23: qty 5

## 2014-11-23 MED ORDER — SODIUM CHLORIDE 0.9 % IJ SOLN
10.0000 mL | INTRAMUSCULAR | Status: DC | PRN
  Administered 2014-11-23: 10 mL via INTRAVENOUS
  Filled 2014-11-23: qty 10

## 2014-11-23 NOTE — Patient Instructions (Signed)
Magnesium Sulfate injection °What is this medicine? °MAGNESIUM SULFATE (mag NEE zee um SUL fate) is an electrolyte injection commonly used to treat low magnesium levels in your blood and to prevent or control certain seizures. °This medicine may be used for other purposes; ask your health care provider or pharmacist if you have questions. °What should I tell my health care provider before I take this medicine? °They need to know if you have any of these conditions: °-heart disease °-history of irregular heart beat °-kidney disease °-an unusual or allergic reaction to magnesium sulfate, medicines, foods, dyes, or preservatives °-pregnant or trying to get pregnant °-breast-feeding °How should I use this medicine? °This medicine is for infusion into a vein. It is given by a health care professional in a hospital or clinic setting. °Talk to your pediatrician regarding the use of this medicine in children. While this drug may be prescribed for selected conditions, precautions do apply. °Overdosage: If you think you have taken too much of this medicine contact a poison control center or emergency room at once. °NOTE: This medicine is only for you. Do not share this medicine with others. °What if I miss a dose? °This does not apply. °What may interact with this medicine? °This medicine may interact with the following medications: °-certain medicines for anxiety or sleep °-certain medicines for seizures like phenobarbital °-digoxin °-medicines that relax muscles for surgery °-narcotic medicines for pain °This list may not describe all possible interactions. Give your health care provider a list of all the medicines, herbs, non-prescription drugs, or dietary supplements you use. Also tell them if you smoke, drink alcohol, or use illegal drugs. Some items may interact with your medicine. °What should I watch for while using this medicine? °Your condition will be monitored carefully while you are receiving this medicine. You  may need blood work done while you are receiving this medicine. °What side effects may I notice from receiving this medicine? °Side effects that you should report to your doctor or health care professional as soon as possible: °-allergic reactions like skin rash, itching or hives, swelling of the face, lips, or tongue °-facial flushing °-muscle weakness °-signs and symptoms of low blood pressure like dizziness; feeling faint or lightheaded, falls; unusually weak or tired °-signs and symptoms of a dangerous change in heartbeat or heart rhythm like chest pain; dizziness; fast or irregular heartbeat; palpitations; breathing problems °-sweating °This list may not describe all possible side effects. Call your doctor for medical advice about side effects. You may report side effects to FDA at 1-800-FDA-1088. °Where should I keep my medicine? °This drug is given in a hospital or clinic and will not be stored at home. °NOTE: This sheet is a summary. It may not cover all possible information. If you have questions about this medicine, talk to your doctor, pharmacist, or health care provider. °© 2015, Elsevier/Gold Standard. (2013-04-18 10:35:11) ° °

## 2014-11-28 ENCOUNTER — Other Ambulatory Visit: Payer: Self-pay | Admitting: *Deleted

## 2014-11-28 ENCOUNTER — Other Ambulatory Visit: Payer: Self-pay | Admitting: Oncology

## 2014-11-28 ENCOUNTER — Other Ambulatory Visit: Payer: Self-pay | Admitting: Nurse Practitioner

## 2014-11-28 DIAGNOSIS — C9110 Chronic lymphocytic leukemia of B-cell type not having achieved remission: Secondary | ICD-10-CM

## 2014-11-28 DIAGNOSIS — Z9489 Other transplanted organ and tissue status: Secondary | ICD-10-CM

## 2014-11-28 DIAGNOSIS — D89811 Chronic graft-versus-host disease: Secondary | ICD-10-CM

## 2014-11-28 DIAGNOSIS — T8609 Other complications of bone marrow transplant: Secondary | ICD-10-CM

## 2014-11-28 MED ORDER — PREDNISONE 5 MG PO TABS: 2.5000 mg | ORAL_TABLET | Freq: Two times a day (BID) | ORAL | Status: DC

## 2014-12-01 ENCOUNTER — Other Ambulatory Visit (HOSPITAL_BASED_OUTPATIENT_CLINIC_OR_DEPARTMENT_OTHER): Payer: BC Managed Care – PPO

## 2014-12-01 ENCOUNTER — Telehealth: Payer: Self-pay | Admitting: Oncology

## 2014-12-01 ENCOUNTER — Ambulatory Visit (HOSPITAL_BASED_OUTPATIENT_CLINIC_OR_DEPARTMENT_OTHER): Payer: BC Managed Care – PPO

## 2014-12-01 ENCOUNTER — Ambulatory Visit (HOSPITAL_BASED_OUTPATIENT_CLINIC_OR_DEPARTMENT_OTHER): Payer: BC Managed Care – PPO | Admitting: Oncology

## 2014-12-01 VITALS — BP 149/79 | HR 71 | Temp 97.8°F | Resp 18 | Ht 66.0 in | Wt 220.8 lb

## 2014-12-01 DIAGNOSIS — E860 Dehydration: Secondary | ICD-10-CM

## 2014-12-01 DIAGNOSIS — G8929 Other chronic pain: Secondary | ICD-10-CM

## 2014-12-01 DIAGNOSIS — D801 Nonfamilial hypogammaglobulinemia: Secondary | ICD-10-CM

## 2014-12-01 DIAGNOSIS — M810 Age-related osteoporosis without current pathological fracture: Secondary | ICD-10-CM

## 2014-12-01 DIAGNOSIS — C9110 Chronic lymphocytic leukemia of B-cell type not having achieved remission: Secondary | ICD-10-CM

## 2014-12-01 DIAGNOSIS — E099 Drug or chemical induced diabetes mellitus without complications: Secondary | ICD-10-CM

## 2014-12-01 DIAGNOSIS — D044 Carcinoma in situ of skin of scalp and neck: Secondary | ICD-10-CM

## 2014-12-01 DIAGNOSIS — T380X5A Adverse effect of glucocorticoids and synthetic analogues, initial encounter: Secondary | ICD-10-CM

## 2014-12-01 DIAGNOSIS — E2740 Unspecified adrenocortical insufficiency: Secondary | ICD-10-CM

## 2014-12-01 DIAGNOSIS — M549 Dorsalgia, unspecified: Principal | ICD-10-CM

## 2014-12-01 DIAGNOSIS — D89811 Chronic graft-versus-host disease: Secondary | ICD-10-CM

## 2014-12-01 DIAGNOSIS — E46 Unspecified protein-calorie malnutrition: Secondary | ICD-10-CM

## 2014-12-01 DIAGNOSIS — T8609 Other complications of bone marrow transplant: Secondary | ICD-10-CM

## 2014-12-01 DIAGNOSIS — Z9489 Other transplanted organ and tissue status: Secondary | ICD-10-CM

## 2014-12-01 DIAGNOSIS — M818 Other osteoporosis without current pathological fracture: Secondary | ICD-10-CM

## 2014-12-01 DIAGNOSIS — D849 Immunodeficiency, unspecified: Secondary | ICD-10-CM

## 2014-12-01 DIAGNOSIS — E291 Testicular hypofunction: Secondary | ICD-10-CM

## 2014-12-01 DIAGNOSIS — D899 Disorder involving the immune mechanism, unspecified: Secondary | ICD-10-CM

## 2014-12-01 LAB — COMPREHENSIVE METABOLIC PANEL (CC13)
ALBUMIN: 3.6 g/dL (ref 3.5–5.0)
ALT: 62 U/L — ABNORMAL HIGH (ref 0–55)
AST: 39 U/L — ABNORMAL HIGH (ref 5–34)
Alkaline Phosphatase: 83 U/L (ref 40–150)
Anion Gap: 8 mEq/L (ref 3–11)
BUN: 41.5 mg/dL — AB (ref 7.0–26.0)
CALCIUM: 8.7 mg/dL (ref 8.4–10.4)
CO2: 20 mEq/L — ABNORMAL LOW (ref 22–29)
Chloride: 111 mEq/L — ABNORMAL HIGH (ref 98–109)
Creatinine: 1.4 mg/dL — ABNORMAL HIGH (ref 0.7–1.3)
EGFR: 50 mL/min/{1.73_m2} — AB (ref >90)
GLUCOSE: 252 mg/dL — AB (ref 70–140)
Potassium: 5.3 mEq/L — ABNORMAL HIGH (ref 3.5–5.1)
Sodium: 138 mEq/L (ref 136–145)
TOTAL PROTEIN: 5.8 g/dL — AB (ref 6.4–8.3)
Total Bilirubin: 0.31 mg/dL (ref 0.20–1.20)

## 2014-12-01 LAB — CBC WITH DIFFERENTIAL/PLATELET
BASO%: 0.6 % (ref 0.0–2.0)
BASOS ABS: 0 10*3/uL (ref 0.0–0.1)
EOS ABS: 0 10*3/uL (ref 0.0–0.5)
EOS%: 0.7 % (ref 0.0–7.0)
HEMATOCRIT: 39.9 % (ref 38.4–49.9)
HGB: 12.7 g/dL — ABNORMAL LOW (ref 13.0–17.1)
LYMPH#: 1.6 10*3/uL (ref 0.9–3.3)
LYMPH%: 34.5 % (ref 14.0–49.0)
MCH: 29.8 pg (ref 27.2–33.4)
MCHC: 31.9 g/dL — ABNORMAL LOW (ref 32.0–36.0)
MCV: 93.3 fL (ref 79.3–98.0)
MONO#: 0.4 10*3/uL (ref 0.1–0.9)
MONO%: 8.9 % (ref 0.0–14.0)
NEUT%: 55.3 % (ref 39.0–75.0)
NEUTROS ABS: 2.5 10*3/uL (ref 1.5–6.5)
Platelets: 130 10*3/uL — ABNORMAL LOW (ref 140–400)
RBC: 4.27 10*6/uL (ref 4.20–5.82)
RDW: 15.5 % — AB (ref 11.0–14.6)
WBC: 4.5 10*3/uL (ref 4.0–10.3)

## 2014-12-01 LAB — MAGNESIUM (CC13): Magnesium: 1.8 mg/dl (ref 1.5–2.5)

## 2014-12-01 MED ORDER — SODIUM CHLORIDE 0.9 % IV SOLN
1.0000 g | Freq: Once | INTRAVENOUS | Status: AC
  Administered 2014-12-01: 1 g via INTRAVENOUS
  Filled 2014-12-01: qty 2

## 2014-12-01 NOTE — Progress Notes (Signed)
ID: Timothy [name]   DOB: 1946/11/15  MR#: [id number]  FXT#:[id number]  GDJ:MEQA, Timothy Saxon, MD SU: OTHER MD: [name]; [name], MD; [name], [name],MD; [name], MD; [facility] in [place], [place]:  Timothy Duval, RN [phone])  CHIEF COMPLAINT:  CLL, status post allogeneic stem cell transplant, GVHD CURRENT TREATMENT: Immunosuppression  HISTORY OF CLL: From the original intake note:  We have very complete records from Dr. Racheal Patches in [place], and in summary:  The patient was initially diagnosed in August 2000, with a white cell count of 23,600, but normal hemoglobin and platelets, and no significant symptomatology. Over the next several years his white cell count drifted up, and he eventually developed some symptoms of night sweats in particular, leading to treatment with fludarabine, Cytoxan and rituxan for five cycles given between December 2006 and May 2007.  We have CT scans from June 2006, November 2006 and April 2007, and comparing the November 2006 and April 2007 scans, there was near complete response. He had subsequent therapy in [place] as detailed below, but with decreased response, leading to allogeneic stem-cell transplant at the [facility] 02/24/2012.  Subsequent history is as detailed below.  INTERVAL HISTORY: Timothy [name] returns today with his wife Timothy [name] for follow up of his chronic lymphoid leukemia and graft-versus-host disease. He is very stable. He continues to exercise and is now going up and down the stairs several times a day. He can go up 7 inches with his right leg but only 3-4 inches with this left. He is working on that. He is pretty much not using a walker at all at this point  REVIEW OF SYSTEMS: [name] is having 4 or 5 bowel movements over 24 hours, most of those at night. We talked about the use of Imodium and Questran, and when he takes Questran he uses Splenda, which may be  complicating things. He has had no rash problems no problems with his mouth, no fever, and the rest of the detailed review of systems today was noncontributory. He continues to be intellectually very alert and also had an unusual experience recently at the used book store where he was looking at a set of cards just to see how they where arranged and up came a card about the danger of aspergillus after hematologic transplant! A detailed review of systems today was otherwise stable.  PAST MEDICAL HISTORY: Past Medical History  Diagnosis Date  . 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 Diverticular disease   . Hyperlipidemia   . Obesity   . Hypertension   . Hiatal hernia   . CMV (cytomegalovirus) antibody positive     pre-transplant, with seroconversion x2 pst-transplant  . Right bundle branch block     pre-transplant  . CKD (chronic kidney disease) stage 2, GFR 60-89 ml/min   . Pancytopenia   . Steroid-induced diabetes   . Atrial fibrillation     post-transplant  . Myopathy   . 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 Leukemia, chronic lymphoid   . 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 Diarrhea in adult patient 12/05/2012    Due to active GVHD  . CLL (chronic lymphocytic leukemia) 12/05/2012    Dx 07/1999; started Rx 12/06  AlloBMT 3/13  . Rash of face 12/05/2012    Due to GVHD  . Hypomagnesemia 01/26/2013  . Left hip pain 12/01/2013  PAST SURGICAL HISTORY: Past Surgical History  Procedure Laterality Date  . Tonsillectomy and adenoidectomy    . Bone marrow transplant    . Flexible sigmoidoscopy  11/17/2012    Procedure: FLEXIBLE SIGMOIDOSCOPY;  Surgeon: Jeryl Columbia, MD;  Location: [facility] ENDOSCOPY;  Service: Endoscopy;  Laterality: N/A;  Dr Watt Climes states will be admitted to rooom 1339 11/16/12  . Esophagogastroduodenoscopy  11/17/2012    Procedure:  ESOPHAGOGASTRODUODENOSCOPY (EGD);  Surgeon: Jeryl Columbia, MD;  Location: [facility];  Service: Endoscopy;  Laterality: N/A;    FAMILY HISTORY Family History  Problem Relation Age of Onset  . Cancer Father   The patient's father died from complications of chronic lymphocytic leukemia at the age of 72.  It had been diagnosed seven years before when he was 55.  The patient is enrolled in a familial chronic lymphocytic leukemia study out of the [employer].  The patient's mother is 68, alive, unfortunately suffering with dementia, and he has a brother, 75, who is otherwise in fair health.   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  His wife of >40 years, Timothy [name], is a homemaker.  Their daughter, Timothy [name], lives in [place].  She also is a Agricultural engineer.  The patient has an 84 year old grandson and an 21-year-old granddaughter, and that is really the main reason he moved to this area.  He is a Tourist information centre manager.     ADVANCED DIRECTIVES: In place  HEALTH MAINTENANCE: (Updated 04/13/2014) History  Substance Use Topics  . Smoking status: Never Smoker   . Smokeless tobacco: Never Used  . Alcohol Use: No     Colonoscopy: Nov 2013, Dr. Watt Climes  PSA: Not on file  Bone density:  Feb 2014;  Patient also has known insufficiency and pathologic fractures in addition to his long-standing history of steroid use.  Lipid panel: Jan 2015, elevated    Allergies  Allergen Reactions  . Benadryl [Diphenhydramine Hcl]     "Restless leg syndrome"    Current Outpatient Prescriptions  Medication Sig Dispense Refill  . acyclovir (ZOVIRAX) 400 MG tablet Take 2 tablets (800 mg total) by mouth 2 (two) times daily. 120 tablet 3  . atorvastatin (LIPITOR) 10 MG tablet Take 10 mg by mouth  daily.    . budesonide (ENTOCORT EC) 3 MG 24 hr capsule Take 1 capsule (3 mg total) by mouth 3 (three) times daily. 90 capsule 6  . CARTIA XT 240 MG 24 hr capsule TAKE 1 CAPSULE BY MOUTH DAILY 30 capsule 3  . cholestyramine (QUESTRAN) 4 G packet Take 1 packet (4 g total) by mouth daily. 30 each 12  . diltiazem (DILACOR XR) 240 MG 24 hr capsule Take 1 capsule (240 mg total) by mouth every evening. 30 capsule 5  . fluconazole (DIFLUCAN) 100 MG tablet Take 1 tablet (100 mg total) by mouth once. 30 tablet 3  . furosemide (LASIX) 20 MG tablet TAKE 1 TABLET BY MOUTH DAILY. 30 tablet 1  . glucose blood (ONE TOUCH ULTRA TEST) test strip Test before meals and at bedtime. 300 each 0  . HYDROcodone-acetaminophen (NORCO) 7.5-325 MG per tablet Take 1 tablet by mouth every 4 (four) hours as needed for moderate pain. 120 tablet 0  . Insulin Aspart Prot & Aspart (NOVOLOG 70/30 MIX) (70-30) 100 UNIT/ML Pen Inject  10 Units into the skin 2 (two) times daily. 12 units with breakfast, 12 units with dinner    . Insulin Pen Needle (B-D UF III MINI PEN NEEDLES) 31G X 5 MM MISC Use twice daily with insulin as directed 100 each 5  . labetalol (NORMODYNE) 200 MG tablet Take 400 mg by mouth 2 (two) times daily.     Marland Kitchen lidocaine (LIDODERM) 5 % Place 1 patch onto the skin daily. Remove & Discard patch within 12 hours or as directed by MD 30 patch 0  . Lidocaine-Hydrocortisone Ace 3-0.5 % KIT Apply 1 application topically as needed (for pain). 1 each 3  . lidocaine-prilocaine (EMLA) cream Apply 1 application topically as needed. 1-2 hrs before each port access 30 g 6  . lisinopril (PRINIVIL,ZESTRIL) 10 MG tablet Take 10 mg by mouth every evening.     . loratadine (CLARITIN) 10 MG tablet Take 10 mg by mouth daily.    . Multiple Vitamin (MULTIVITAMIN WITH MINERALS) TABS tablet Take 1 tablet by mouth daily.    Marland Kitchen omeprazole (PRILOSEC) 20 MG capsule Take 1 capsule (20 mg total) by mouth daily. 30 capsule 5  . ONETOUCH DELICA LANCETS  58K MISC TEST BEFORE MEALS AND AT BEDTIME. 300 each 0  . predniSONE (DELTASONE) 5 MG tablet Take 0.5-1 tablets (2.5-5 mg total) by mouth 2 (two) times daily. Takes $RemoveBefo'5mg'SYnkzRFVxrM$  in the morning and 2.$RemoveBefor'5mg'WnlgLoJiJGlG$  in the evening. 45 tablet 2  . sertraline (ZOLOFT) 50 MG tablet ALTERNATE TAKING 1 TABLET DAILY AND 2 TABLETS DAILY 90 tablet 5  . sulfamethoxazole-trimethoprim (BACTRIM DS,SEPTRA DS) 800-160 MG per tablet Take 1 tablet by mouth daily. 30 tablet 5  . tacrolimus (PROGRAF) 0.5 MG capsule Take 3 capsules (1.5 mg total) by mouth 2 (two) times daily. 180 capsule 4  . vancomycin (VANCOCIN) 125 MG capsule     . VOLTAREN 1 % GEL Apply 2 g topically 2 (two) times daily. Applied to back    . [DISCONTINUED] insulin aspart (NOVOLOG FLEXPEN) 100 UNIT/ML SOPN FlexPen 18units sq qam, 9units sq qpm, or as directed 15 mL 1   No current facility-administered medications for this visit.   Facility-Administered Medications Ordered in Other Visits  Medication Dose Route Frequency Provider Last Rate Last Dose  . 0.9 %  sodium chloride infusion   Intravenous Continuous Amy G Berry, PA-C 500 mL/hr at 03/12/13 0900    . sodium chloride 0.9 % injection 10 mL  10 mL Intravenous PRN Chauncey Cruel, MD   10 mL at 08/11/12 1606    OBJECTIVE: Middle-aged white man in no acute distress Filed Vitals:   12/01/14 0916  BP: 149/79  Pulse: 71  Temp: 97.8 F (36.6 C)  Resp: 18  Body mass index is 35.65 kg/(m^2).  ECOG: 1 Filed Weights   12/01/14 0916  Weight: 220 lb 12.8 oz (100.154 kg)   Sclerae unicteric, pupils equal and reactive Oropharynx clear and moist-- no thrush or other lesions No cervical or supraclavicular adenopathy Lungs no rales or rhonchi Heart regular rate and rhythm Abd soft, obese, nontender, positive bowel sounds MSK kyphosis and scoliosis but no focal spinal tenderness Neuro: nonfocal, well oriented, positive affect Skin: Multiple ecchymosis as before, but no rash   LABS:  CBC    Component Value  Date/Time   WBC 4.5 12/01/2014 0902   WBC 7.1 04/06/2014 0949   RBC 4.27 12/01/2014 0902   RBC 4.06* 04/06/2014 0949   RBC 3.82* 03/16/2013 1400   HGB 12.7* 12/01/2014 0902  HGB 12.2* 04/06/2014 0949   HCT 39.9 12/01/2014 0902   HCT 38.5* 04/06/2014 0949   PLT 130* 12/01/2014 0902   PLT 144* 04/06/2014 0949   MCV 93.3 12/01/2014 0902   MCV 94.8 04/06/2014 0949   MCH 29.8 12/01/2014 0902   MCH 30.0 04/06/2014 0949   MCHC 31.9* 12/01/2014 0902   MCHC 31.7 04/06/2014 0949   RDW 15.5* 12/01/2014 0902   RDW 15.7* 04/06/2014 0949   LYMPHSABS 1.6 12/01/2014 0902   LYMPHSABS 1.4 03/18/2013 0615   MONOABS 0.4 12/01/2014 0902   MONOABS 0.3 03/18/2013 0615   EOSABS 0.0 12/01/2014 0902   EOSABS 0.0 03/18/2013 0615   BASOSABS 0.0 12/01/2014 0902   BASOSABS 0.0 03/18/2013 0615       Chemistry      Component Value Date/Time   NA 139 11/23/2014 1229   NA 139 09/07/2014 1324   K 5.3* 11/23/2014 1229   K 4.8 09/07/2014 1324   CL 110 09/07/2014 1324   CL 101 06/15/2013 1034   CO2 19* 11/23/2014 1229   CO2 15* 09/07/2014 1324   BUN 46.1* 11/23/2014 1229   BUN 46* 09/07/2014 1324   CREATININE 1.4* 11/23/2014 1229   CREATININE 1.20 09/07/2014 1324      Component Value Date/Time   CALCIUM 9.3 11/23/2014 1229   CALCIUM 9.1 09/07/2014 1324   ALKPHOS 82 11/23/2014 1229   ALKPHOS 80 09/07/2014 1324   AST 30 11/23/2014 1229   AST 40* 09/07/2014 1324   ALT 40 11/23/2014 1229   ALT 55* 09/07/2014 1324   BILITOT 0.30 11/23/2014 1229   BILITOT <0.2* 09/07/2014 1324     STUDIES: No results found.   ASSESSMENT: 68 y.o. [place] man with a history of well-differentiated lymphocytic lymphoma/ chronic lymphoid leukemia initially diagnosed in 2000, not requiring intervention until 2006; with multiple chromosomal abnormalities.  His treatment history is as follows:  (1) fludarabine/cyclophosphamide/rituximab x5 completed May 2007.   (2) rituximab for 8 doses October 2010, with partial  response   (3) Leustatin and ofatumumab weekly x8 July to September 2011 followed by maintenance ofatumumab  every 2 months, with initial response but rising counts September 2012   (4) status-post unrelated donor stem-cell transplant 02/24/2012 at [facility]  (a) conditioning regimen consisted of fludarabine + TBI at 200 cGy, followed by rituximab x27;  (b) CMV reactivation x3 (patient CMV positive, donor negative), s/p ganciclovir treatment; 3d reactivation August 2013, s/p gancyclovir, with negative PCR mid-September 2013; last gancyclovir dose 10/06/2012 (c) Chronic GVHD: involving gut and skin, treated with steroids, tacrolimus and MMF.  MMF was eventually d/c'd and tacrolimus currently at a dose of 1.$RemoveB'5mg'psLoWlGD$  BID (d) atrial fibrillation: resolved on brief amiodarone regimen (e) steroid-induced myopathy: improving  (f) hypomagnesemia: improved after d/c gancyclovir, needs continuing support (g) hypogammaglobulinemia: requiring IVIG most recently 08/03/2014. (h) history of elevated triglycerides (606 on 07/14/2012)  (i) adrenal insufficiency: on prednisone and budesonide (j) pancytopenia,resolved (k) brief episode of neutropenia ([place] 300) February 2015, accompanied by diarrhea; resolved   (5) restaging studies February-March 2015 including CT scans, flow cytometry, and bone marrow biopsy, showed no evidence of residual chronic lymphoid leukemia.  (6) recurrent GVHD (skin rash, mouth changes, severe diarrhea and gastric/duodenal/colonic biopsies 11/17/2012 c/w GVHD grade 2) : now grade 1 to inactive  (7)  malnutrition -- on VITAL supplement in addition to regular diet; on Marinol for anorexia  (8) testosterone deficiency--on patch   (9) deconditioning: Especially quad weakness; continuing rehabilitation exercises  (10) mild dehydration: encouraged  increased po fluids; receives IVF support w magnesium weekly  (11) severe steroid-induced osteoporosis with compression fractures: received  pamidronate 12/18/2012. Status post kyphoplasty at L3-4 in June 2014. Also with evidence of rib fractures and insufficiency fractures bilaterally of the sacral  alae, noted by CT in March 2015. --   Denosumab started 12/08/2013, given as prolia Q6 months which is what has been approved by his insurance, most recent dose 10/02/2014  (12) chronic back pain and hip pain controlled with OxyContin and hydrocodone/APAP.  (12) nausea: well controlled on current meds  (13)  Positive c.diff, 03/08/2013, on Flagyl 500 mg TID x 20 days, then on oral vanco with Questran, showing improvement; positive when repeated April 2014; Negative x 3 since then  (14) persistently increased BUN and potassium  (15)  Hypertension, on labetalol, cardizem, lisinopril, and furosemide; managed by Dr. Brigitte Pulse  (16) steroid induced hyperglycemia, on sterlix and 70/30 insulin, followed by Dr. Loanne Drilling and Dr. Brigitte Pulse  (17) hypogammaglobulinemia-- requiring intermittent supplementation, most recent dose 10/19/2014  (18) squamous cell CA in situ removed from left parietal scalp October 2014, second lesion to be removed may 2015   PLAN: Kojo continues to do well as far as his post transplant problems are concerned. I do not know why he has more bowel movements at night than during the day. He takes Questran at bedtime. However he uses Splenda with it instead of sugar. Possibly that might be causing the loose bowel movements. I suggested just for the next week or 2 views a little bit of real sugar and see that makes a difference.  He is getting his insulin changed through Dr. Raul Del office. We talked about always eating the same number of calories at the same time of day and always doing the same exercise level. This is not something Nhia is going to do. He likes to be more spontaneous. Accordingly the plan they have now, where he will be dosing pre-meal regular insulin depending on what he intensity seems to me like an improvement. He  will continue to check his blood sugar 3 times a day and of course he follows all that through Dr. Brigitte Pulse.  He tells me his nephrologist is concerned that the Lipitor may cause problems with muscle building and may actually promote muscle breakdown. Of course is always a possibility with statins. I think at this point if he wants to stay off the Lipitor for a while the long-term effect will be minimal.  He wondered if he needed to go to [facility] in March. I strongly recommended that he do that but also that he discuss with them whether it would be possible per him to be followed at one of the transplant centers here in [place], most likely [place]. The trips to [facility]. big interruption in his life and also of course very expensive.  Otherwise we are continuing as before. He will return in 2 weeks for visits and then again see me in 4 weeks. We will continue the weekly fluids with magnesium. He knows to call for any problems that may develop before the next visit here. Chauncey Cruel, MD 12/01/2014

## 2014-12-01 NOTE — Telephone Encounter (Signed)
per pof to sch pt appts-sent MW email to sch pt appt-will mail copy per pt req once reply

## 2014-12-01 NOTE — Patient Instructions (Signed)
Dehydration, Adult Dehydration is when you lose more fluids from the body than you take in. Vital organs like the kidneys, brain, and heart cannot function without a proper amount of fluids and salt. Any loss of fluids from the body can cause dehydration.  CAUSES   Vomiting.  Diarrhea.  Excessive sweating.  Excessive urine output.  Fever. SYMPTOMS  Mild dehydration  Thirst.  Dry lips.  Slightly dry mouth. Moderate dehydration  Very dry mouth.  Sunken eyes.  Skin does not bounce back quickly when lightly pinched and released.  Dark urine and decreased urine production.  Decreased tear production.  Headache. Severe dehydration  Very dry mouth.  Extreme thirst.  Rapid, weak pulse (more than 100 beats per minute at rest).  Cold hands and feet.  Not able to sweat in spite of heat and temperature.  Rapid breathing.  Blue lips.  Confusion and lethargy.  Difficulty being awakened.  Minimal urine production.  No tears. DIAGNOSIS  Your caregiver will diagnose dehydration based on your symptoms and your exam. Blood and urine tests will help confirm the diagnosis. The diagnostic evaluation should also identify the cause of dehydration. TREATMENT  Treatment of mild or moderate dehydration can often be done at home by increasing the amount of fluids that you drink. It is best to drink small amounts of fluid more often. Drinking too much at one time can make vomiting worse. Refer to the home care instructions below. Severe dehydration needs to be treated at the hospital where you will probably be given intravenous (IV) fluids that contain water and electrolytes. HOME CARE INSTRUCTIONS   Ask your caregiver about specific rehydration instructions.  Drink enough fluids to keep your urine clear or pale yellow.  Drink small amounts frequently if you have nausea and vomiting.  Eat as you normally do.  Avoid:  Foods or drinks high in sugar.  Carbonated  drinks.  Juice.  Extremely hot or cold fluids.  Drinks with caffeine.  Fatty, greasy foods.  Alcohol.  Tobacco.  Overeating.  Gelatin desserts.  Wash your hands well to avoid spreading bacteria and viruses.  Only take over-the-counter or prescription medicines for pain, discomfort, or fever as directed by your caregiver.  Ask your caregiver if you should continue all prescribed and over-the-counter medicines.  Keep all follow-up appointments with your caregiver. SEEK MEDICAL CARE IF:  You have abdominal pain and it increases or stays in one area (localizes).  You have a rash, stiff neck, or severe headache.  You are irritable, sleepy, or difficult to awaken.  You are weak, dizzy, or extremely thirsty. SEEK IMMEDIATE MEDICAL CARE IF:   You are unable to keep fluids down or you get worse despite treatment.  You have frequent episodes of vomiting or diarrhea.  You have blood or green matter (bile) in your vomit.  You have blood in your stool or your stool looks black and tarry.  You have not urinated in 6 to 8 hours, or you have only urinated a small amount of very dark urine.  You have a fever.  You faint. MAKE SURE YOU:   Understand these instructions.  Will watch your condition.  Will get help right away if you are not doing well or get worse. Document Released: 12/08/2005 Document Revised: 03/01/2012 Document Reviewed: 07/28/2011 ExitCare Patient Information 2015 [employer]. This information is not intended to replace advice given to you by your health care provider. Make sure you discuss any questions you have with your health care   provider.  

## 2014-12-04 ENCOUNTER — Other Ambulatory Visit: Payer: Self-pay | Admitting: *Deleted

## 2014-12-05 ENCOUNTER — Telehealth: Payer: Self-pay | Admitting: Oncology

## 2014-12-05 NOTE — Telephone Encounter (Signed)
cld & spoke to pt in re to appt time for 12/17-pt stated will get updated copy of sch that day

## 2014-12-07 ENCOUNTER — Other Ambulatory Visit (HOSPITAL_BASED_OUTPATIENT_CLINIC_OR_DEPARTMENT_OTHER): Payer: BC Managed Care – PPO

## 2014-12-07 ENCOUNTER — Other Ambulatory Visit: Payer: Self-pay | Admitting: *Deleted

## 2014-12-07 ENCOUNTER — Ambulatory Visit: Payer: Self-pay

## 2014-12-07 ENCOUNTER — Ambulatory Visit (HOSPITAL_BASED_OUTPATIENT_CLINIC_OR_DEPARTMENT_OTHER): Payer: BC Managed Care – PPO

## 2014-12-07 DIAGNOSIS — C9110 Chronic lymphocytic leukemia of B-cell type not having achieved remission: Secondary | ICD-10-CM

## 2014-12-07 LAB — COMPREHENSIVE METABOLIC PANEL (CC13)
ALT: 44 U/L (ref 0–55)
AST: 30 U/L (ref 5–34)
Albumin: 3.5 g/dL (ref 3.5–5.0)
Alkaline Phosphatase: 71 U/L (ref 40–150)
Anion Gap: 9 mEq/L (ref 3–11)
BILIRUBIN TOTAL: 0.34 mg/dL (ref 0.20–1.20)
BUN: 41.6 mg/dL — AB (ref 7.0–26.0)
CO2: 19 mEq/L — ABNORMAL LOW (ref 22–29)
Calcium: 8.8 mg/dL (ref 8.4–10.4)
Chloride: 110 mEq/L — ABNORMAL HIGH (ref 98–109)
Creatinine: 1.3 mg/dL (ref 0.7–1.3)
EGFR: 56 mL/min/{1.73_m2} — AB (ref >90)
GLUCOSE: 128 mg/dL (ref 70–140)
Potassium: 4.8 mEq/L (ref 3.5–5.1)
SODIUM: 139 meq/L (ref 136–145)
Total Protein: 5.5 g/dL — ABNORMAL LOW (ref 6.4–8.3)

## 2014-12-07 LAB — MAGNESIUM (CC13): MAGNESIUM: 1.8 mg/dL (ref 1.5–2.5)

## 2014-12-07 MED ORDER — SODIUM CHLORIDE 0.9 % IV SOLN
Freq: Once | INTRAVENOUS | Status: AC
  Administered 2014-12-07: 14:00:00 via INTRAVENOUS

## 2014-12-07 MED ORDER — SODIUM CHLORIDE 0.9 % IV SOLN: INTRAVENOUS | Status: DC

## 2014-12-07 MED ORDER — SODIUM CHLORIDE 0.9 % IJ SOLN
10.0000 mL | INTRAMUSCULAR | Status: DC | PRN
  Administered 2014-12-07: 10 mL via INTRAVENOUS
  Filled 2014-12-07: qty 10

## 2014-12-07 MED ORDER — SODIUM CHLORIDE 0.9 % IV SOLN
1.0000 g | Freq: Once | INTRAVENOUS | Status: AC
  Administered 2014-12-07: 1 g via INTRAVENOUS
  Filled 2014-12-07: qty 2

## 2014-12-07 MED ORDER — HEPARIN SOD (PORK) LOCK FLUSH 100 UNIT/ML IV SOLN
500.0000 [IU] | Freq: Once | INTRAVENOUS | Status: AC | PRN
  Administered 2014-12-07: 500 [IU]
  Filled 2014-12-07: qty 5

## 2014-12-07 MED ORDER — HEPARIN SOD (PORK) LOCK FLUSH 100 UNIT/ML IV SOLN
250.0000 [IU] | Freq: Once | INTRAVENOUS | Status: DC | PRN
  Filled 2014-12-07: qty 5

## 2014-12-07 NOTE — Patient Instructions (Signed)
Magnesium Sulfate injection °What is this medicine? °MAGNESIUM SULFATE (mag NEE zee um SUL fate) is an electrolyte injection commonly used to treat low magnesium levels in your blood and to prevent or control certain seizures. °This medicine may be used for other purposes; ask your health care provider or pharmacist if you have questions. °What should I tell my health care provider before I take this medicine? °They need to know if you have any of these conditions: °-heart disease °-history of irregular heart beat °-kidney disease °-an unusual or allergic reaction to magnesium sulfate, medicines, foods, dyes, or preservatives °-pregnant or trying to get pregnant °-breast-feeding °How should I use this medicine? °This medicine is for infusion into a vein. It is given by a health care professional in a hospital or clinic setting. °Talk to your pediatrician regarding the use of this medicine in children. While this drug may be prescribed for selected conditions, precautions do apply. °Overdosage: If you think you have taken too much of this medicine contact a poison control center or emergency room at once. °NOTE: This medicine is only for you. Do not share this medicine with others. °What if I miss a dose? °This does not apply. °What may interact with this medicine? °This medicine may interact with the following medications: °-certain medicines for anxiety or sleep °-certain medicines for seizures like phenobarbital °-digoxin °-medicines that relax muscles for surgery °-narcotic medicines for pain °This list may not describe all possible interactions. Give your health care provider a list of all the medicines, herbs, non-prescription drugs, or dietary supplements you use. Also tell them if you smoke, drink alcohol, or use illegal drugs. Some items may interact with your medicine. °What should I watch for while using this medicine? °Your condition will be monitored carefully while you are receiving this medicine. You  may need blood work done while you are receiving this medicine. °What side effects may I notice from receiving this medicine? °Side effects that you should report to your doctor or health care professional as soon as possible: °-allergic reactions like skin rash, itching or hives, swelling of the face, lips, or tongue °-facial flushing °-muscle weakness °-signs and symptoms of low blood pressure like dizziness; feeling faint or lightheaded, falls; unusually weak or tired °-signs and symptoms of a dangerous change in heartbeat or heart rhythm like chest pain; dizziness; fast or irregular heartbeat; palpitations; breathing problems °-sweating °This list may not describe all possible side effects. Call your doctor for medical advice about side effects. You may report side effects to FDA at 1-800-FDA-1088. °Where should I keep my medicine? °This drug is given in a hospital or clinic and will not be stored at home. °NOTE: This sheet is a summary. It may not cover all possible information. If you have questions about this medicine, talk to your doctor, pharmacist, or health care provider. °© 2015, Elsevier/Gold Standard. (2013-04-18 10:35:11) ° °

## 2014-12-14 ENCOUNTER — Ambulatory Visit (HOSPITAL_BASED_OUTPATIENT_CLINIC_OR_DEPARTMENT_OTHER): Payer: BC Managed Care – PPO

## 2014-12-14 ENCOUNTER — Telehealth: Payer: Self-pay | Admitting: *Deleted

## 2014-12-14 ENCOUNTER — Other Ambulatory Visit (HOSPITAL_BASED_OUTPATIENT_CLINIC_OR_DEPARTMENT_OTHER): Payer: BC Managed Care – PPO

## 2014-12-14 ENCOUNTER — Ambulatory Visit (HOSPITAL_BASED_OUTPATIENT_CLINIC_OR_DEPARTMENT_OTHER): Payer: BC Managed Care – PPO | Admitting: Nurse Practitioner

## 2014-12-14 ENCOUNTER — Telehealth: Payer: Self-pay | Admitting: Nurse Practitioner

## 2014-12-14 ENCOUNTER — Encounter: Payer: Self-pay | Admitting: Nurse Practitioner

## 2014-12-14 VITALS — BP 137/73 | HR 72 | Temp 98.4°F | Resp 18 | Ht 66.0 in | Wt 216.1 lb

## 2014-12-14 DIAGNOSIS — T8609 Other complications of bone marrow transplant: Secondary | ICD-10-CM

## 2014-12-14 DIAGNOSIS — D89811 Chronic graft-versus-host disease: Secondary | ICD-10-CM

## 2014-12-14 DIAGNOSIS — C9110 Chronic lymphocytic leukemia of B-cell type not having achieved remission: Secondary | ICD-10-CM

## 2014-12-14 LAB — COMPREHENSIVE METABOLIC PANEL (CC13)
ALT: 39 U/L (ref 0–55)
ANION GAP: 8 meq/L (ref 3–11)
AST: 27 U/L (ref 5–34)
Albumin: 3.6 g/dL (ref 3.5–5.0)
Alkaline Phosphatase: 70 U/L (ref 40–150)
BUN: 35.6 mg/dL — ABNORMAL HIGH (ref 7.0–26.0)
CO2: 22 meq/L (ref 22–29)
CREATININE: 1.4 mg/dL — AB (ref 0.7–1.3)
Calcium: 9.1 mg/dL (ref 8.4–10.4)
Chloride: 110 mEq/L — ABNORMAL HIGH (ref 98–109)
EGFR: 53 mL/min/{1.73_m2} — AB (ref >90)
GLUCOSE: 204 mg/dL — AB (ref 70–140)
Potassium: 4.7 mEq/L (ref 3.5–5.1)
Sodium: 140 mEq/L (ref 136–145)
Total Bilirubin: 0.34 mg/dL (ref 0.20–1.20)
Total Protein: 5.8 g/dL — ABNORMAL LOW (ref 6.4–8.3)

## 2014-12-14 LAB — CBC WITH DIFFERENTIAL/PLATELET
BASO%: 0 % (ref 0.0–2.0)
BASOS ABS: 0 10*3/uL (ref 0.0–0.1)
EOS ABS: 0 10*3/uL (ref 0.0–0.5)
EOS%: 0.4 % (ref 0.0–7.0)
HCT: 40.4 % (ref 38.4–49.9)
HGB: 13.1 g/dL (ref 13.0–17.1)
LYMPH#: 1.8 10*3/uL (ref 0.9–3.3)
LYMPH%: 36.8 % (ref 14.0–49.0)
MCH: 30 pg (ref 27.2–33.4)
MCHC: 32.4 g/dL (ref 32.0–36.0)
MCV: 92.7 fL (ref 79.3–98.0)
MONO#: 0.5 10*3/uL (ref 0.1–0.9)
MONO%: 9.4 % (ref 0.0–14.0)
NEUT%: 53.4 % (ref 39.0–75.0)
NEUTROS ABS: 2.6 10*3/uL (ref 1.5–6.5)
Platelets: 127 10*3/uL — ABNORMAL LOW (ref 140–400)
RBC: 4.36 10*6/uL (ref 4.20–5.82)
RDW: 14.9 % — ABNORMAL HIGH (ref 11.0–14.6)
WBC: 4.8 10*3/uL (ref 4.0–10.3)

## 2014-12-14 LAB — MAGNESIUM (CC13): Magnesium: 1.9 mg/dl (ref 1.5–2.5)

## 2014-12-14 MED ORDER — SODIUM CHLORIDE 0.9 % IV SOLN
1.0000 g | Freq: Once | INTRAVENOUS | Status: AC
  Administered 2014-12-14: 1 g via INTRAVENOUS
  Filled 2014-12-14: qty 2

## 2014-12-14 MED ORDER — HEPARIN SOD (PORK) LOCK FLUSH 100 UNIT/ML IV SOLN
500.0000 [IU] | Freq: Once | INTRAVENOUS | Status: AC | PRN
  Administered 2014-12-14: 500 [IU]
  Filled 2014-12-14: qty 5

## 2014-12-14 MED ORDER — SODIUM CHLORIDE 0.9 % IJ SOLN
10.0000 mL | INTRAMUSCULAR | Status: DC | PRN
  Administered 2014-12-14: 10 mL via INTRAVENOUS
  Filled 2014-12-14: qty 10

## 2014-12-14 NOTE — Telephone Encounter (Signed)
per pof to sch pt appt-sent MW email to sch pt trmt-pt to get updated copy b4 leaving °

## 2014-12-14 NOTE — Progress Notes (Signed)
ID: [name]   DOB: 1946/04/22  MR#: [id number]  ELF#:[id number]  HEN:IDPO, Timothy Saxon, MD SU: OTHER MD: [name]; Timothy Shin, MD; [name], Timothy Hatcher,MD; Timothy Rosenthal, MD; [facility] in [place], [place]:  [name], RN [phone])  CHIEF COMPLAINT:  CLL, status post allogeneic stem cell transplant, GVHD CURRENT TREATMENT: Immunosuppression  HISTORY OF CLL: From the original intake note:  We have very complete records from Dr. Racheal Patches in [place], and in summary:  The patient was initially diagnosed in August 2000, with a white cell count of 23,600, but normal hemoglobin and platelets, and no significant symptomatology. Over the next several years his white cell count drifted up, and he eventually developed some symptoms of night sweats in particular, leading to treatment with fludarabine, Cytoxan and rituxan for five cycles given between December 2006 and May 2007.  We have CT scans from June 2006, November 2006 and April 2007, and comparing the November 2006 and April 2007 scans, there was near complete response. He had subsequent therapy in [place] as detailed below, but with decreased response, leading to allogeneic stem-cell transplant at [name] 02/24/2012.  Subsequent history is as detailed below.  INTERVAL HISTORY: Timothy [name] returns today for follow up of his chronic lymphoid leukemia and graft-versus-host disease. The interval history is generally unremarkable. He is spending the holidays with his daughter and her family this year.   REVIEW OF SYSTEMS: Timothy [name] denies fevers, chills, nausea, or vomiting. His bruising has improved and he has no new rashes or skin tears. He continues to have about 4-5 bowel movements daily, with most of them occuring through the night. He uses [name] powder and imodium daily. He was suggested by Dr. Jana [name] to use real sugar instead of splenda with the questran  powder to possibly reduce the nocturnal bowel movements, but he has not done that. He stools are typically soft and not runny. His appetite is "too good" and he is staying well hydrated. His pain has not increased. He stopped the lipitor under the advice of his nephrologist who believed this could contribute to his leg pain. His strength is slowly improving. He performs various quad exercises daily. He denies shortness of breath, chest pain, cough, palpitations, or fatigue. A detailed review of systems is otherwise stable.    PAST MEDICAL HISTORY: Past Medical History  Diagnosis Date  . 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 Diverticular disease   . Hyperlipidemia   . Obesity   . Hypertension   . Hiatal hernia   . CMV (cytomegalovirus) antibody positive     pre-transplant, with seroconversion x2 pst-transplant  . Right bundle branch block     pre-transplant  . CKD (chronic kidney disease) stage 2, GFR 60-89 ml/min   . Pancytopenia   . Steroid-induced diabetes   . Atrial fibrillation     post-transplant  . Myopathy   . 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 Leukemia, chronic lymphoid   . 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 Diarrhea in adult patient 12/05/2012    Due to active GVHD  . CLL (chronic lymphocytic leukemia) 12/05/2012    Dx 07/1999; started Rx 12/06  AlloBMT 3/13  . Rash of face 12/05/2012    Due to GVHD  . Hypomagnesemia 01/26/2013  . Left hip pain 12/01/2013    PAST SURGICAL HISTORY: Past Surgical History  Procedure  Laterality Date  . Tonsillectomy and adenoidectomy    . Bone marrow transplant    . Flexible sigmoidoscopy  11/17/2012    Procedure: FLEXIBLE SIGMOIDOSCOPY;  Surgeon: Timothy Columbia, MD;  Location: [name];  Service: [name];  Laterality: N/A;  Dr Timothy [name] states will be admitted to rooom 1339 11/16/12  . Esophagogastroduodenoscopy   11/17/2012    Procedure: ESOPHAGOGASTRODUODENOSCOPY (EGD);  Surgeon: Timothy Columbia, MD;  [name];  Service: [name];  Laterality: N/A;    FAMILY HISTORY Family History  Problem Relation Age of Onset  . Cancer Father   The patient's father died from complications of chronic lymphocytic leukemia at the age of 44.  It had been diagnosed seven years before when he was 81.  The patient is enrolled in a familial chronic lymphocytic leukemia study out of the [name].  The patient's mother is 58, alive, unfortunately suffering with dementia, and he has a brother, 67, who is otherwise in fair health.   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  His wife of >40 years, Timothy [name], is a homemaker.  Their daughter, Timothy [name], lives in [place].  She also is a Agricultural engineer.  The patient has an 20 year old grandson and an 41-year-old granddaughter, and that is really the main reason he moved to this area.  He is a Tourist information centre manager.     ADVANCED DIRECTIVES: In place  HEALTH MAINTENANCE: (Updated 04/13/2014) History  Substance Use Topics  . Smoking status: Never Smoker   . Smokeless tobacco: Never Used  . Alcohol Use: No     Colonoscopy: Nov 2013, Dr. Watt [name]  PSA: Not on file  Bone density:  Feb 2014;  Patient also has known insufficiency and pathologic fractures in addition to his long-standing history of steroid use.  Lipid panel: Jan 2015, elevated    Allergies  Allergen Reactions  . Benadryl [Diphenhydramine Hcl]     "Restless leg syndrome"    Current Outpatient Prescriptions  Medication Sig Dispense Refill  . acyclovir (ZOVIRAX) 400 MG tablet Take 2 tablets (800 mg total) by mouth 2 (two) times daily. 120 tablet 3  . budesonide (ENTOCORT EC) 3 MG 24  hr capsule Take 1 capsule (3 mg total) by mouth 3 (three) times daily. 90 capsule 6  . CARTIA XT 240 MG 24 hr capsule TAKE 1 CAPSULE BY MOUTH DAILY 30 capsule 3  . cholestyramine (QUESTRAN) 4 G packet Take 1 packet (4 g total) by mouth daily. 30 each 12  . diltiazem (DILACOR XR) 240 MG 24 hr capsule Take 1 capsule (240 mg total) by mouth every evening. 30 capsule 5  . furosemide (LASIX) 20 MG tablet TAKE 1 TABLET BY MOUTH DAILY. 30 tablet 1  . glucose blood (ONE TOUCH ULTRA TEST) test strip Test before meals and at bedtime. 300 each 0  . HYDROcodone-acetaminophen (NORCO) 7.5-325 MG per tablet Take 1 tablet by mouth every 4 (four) hours as needed for moderate pain. 120 tablet 0  . Insulin Aspart Prot & Aspart (NOVOLOG 70/30 MIX) (70-30) 100 UNIT/ML Pen Inject 10 Units into the skin 2 (two) times daily. 12 units with breakfast, 12 units with dinner    . Insulin Pen Needle (B-D UF III MINI PEN NEEDLES) 31G X 5 MM MISC Use twice daily with insulin as directed  100 each 5  . labetalol (NORMODYNE) 200 MG tablet Take 400 mg by mouth 2 (two) times daily.     Marland Kitchen lidocaine (LIDODERM) 5 % Place 1 patch onto the skin daily. Remove & Discard patch within 12 hours or as directed by MD 30 patch 0  . Lidocaine-Hydrocortisone Ace 3-0.5 % KIT Apply 1 application topically as needed (for pain). 1 each 3  . lidocaine-prilocaine (EMLA) cream Apply 1 application topically as needed. 1-2 hrs before each port access 30 g 6  . lisinopril (PRINIVIL,ZESTRIL) 10 MG tablet Take 10 mg by mouth every evening.     . loratadine (CLARITIN) 10 MG tablet Take 10 mg by mouth daily.    . Multiple Vitamin (MULTIVITAMIN WITH MINERALS) TABS tablet Take 1 tablet by mouth daily.    Marland Kitchen omeprazole (PRILOSEC) 20 MG capsule Take 1 capsule (20 mg total) by mouth daily. 30 capsule 5  . ONETOUCH DELICA LANCETS 03J MISC TEST BEFORE MEALS AND AT BEDTIME. 300 each 0  . predniSONE (DELTASONE) 5 MG tablet Take 0.5-1 tablets (2.5-5 mg total) by mouth 2  (two) times daily. Takes 15m in the morning and 2.520min the evening. 45 tablet 2  . sertraline (ZOLOFT) 50 MG tablet ALTERNATE TAKING 1 TABLET DAILY AND 2 TABLETS DAILY 90 tablet 5  . sulfamethoxazole-trimethoprim (BACTRIM DS,SEPTRA DS) 800-160 MG per tablet Take 1 tablet by mouth daily. 30 tablet 5  . tacrolimus (PROGRAF) 0.5 MG capsule Take 3 capsules (1.5 mg total) by mouth 2 (two) times daily. 180 capsule 4  . vancomycin (VANCOCIN) 125 MG capsule     . VOLTAREN 1 % GEL Apply 2 g topically 2 (two) times daily. Applied to back    . atorvastatin (LIPITOR) 10 MG tablet Take 10 mg by mouth daily.    . Marland Kitchentorvastatin (LIPITOR) 40 MG tablet   6  . fluconazole (DIFLUCAN) 100 MG tablet Take 1 tablet (100 mg total) by mouth once. (Patient not taking: Reported on 12/14/2014) 30 tablet 3  . [DISCONTINUED] insulin aspart (NOVOLOG FLEXPEN) 100 UNIT/ML SOPN FlexPen 18units sq qam, 9units sq qpm, or as directed 15 mL 1   No current facility-administered medications for this visit.   Facility-Administered Medications Ordered in Other Visits  Medication Dose Route Frequency Provider Last Rate Last Dose  . 0.9 %  sodium chloride infusion   Intravenous Continuous Amy G Berry, PA-C 500 mL/hr at 03/12/13 0900    . sodium chloride 0.9 % injection 10 mL  10 mL Intravenous PRN GuChauncey CruelMD   10 mL at 08/11/12 1606    OBJECTIVE: Middle-aged white man in no acute distress Filed Vitals:   12/14/14 1008  BP: 137/73  Pulse: 72  Temp: 98.4 F (36.9 C)  Resp: 18  Body mass index is 34.9 kg/(m^2).  ECOG: 1 Filed Weights   12/14/14 1008  Weight: 216 lb 1.6 oz (98.022 kg)    Skin: warm, dry, stable eccchymosis HEENT: sclerae anicteric, conjunctivae pink, oropharynx clear. No thrush or mucositis.  Lymph Nodes: No cervical or supraclavicular lymphadenopathy  Lungs: clear to auscultation bilaterally, no rales, wheezes, or rhonci  Heart: regular rate and rhythm  Abdomen: round, soft, non tender, positive  bowel sounds  Musculoskeletal: No focal spinal tenderness, no peripheral edema  Neuro: non focal, well oriented, positive affect   LABS:  CBC    Component Value Date/Time   WBC 4.8 12/14/2014 0949   WBC 7.1 04/06/2014 0949   RBC 4.36 12/14/2014 0949  RBC 4.06* 04/06/2014 0949   RBC 3.82* 03/16/2013 1400   HGB 13.1 12/14/2014 0949   HGB 12.2* 04/06/2014 0949   HCT 40.4 12/14/2014 0949   HCT 38.5* 04/06/2014 0949   PLT 127* 12/14/2014 0949   PLT 144* 04/06/2014 0949   MCV 92.7 12/14/2014 0949   MCV 94.8 04/06/2014 0949   MCH 30.0 12/14/2014 0949   MCH 30.0 04/06/2014 0949   MCHC 32.4 12/14/2014 0949   MCHC 31.7 04/06/2014 0949   RDW 14.9* 12/14/2014 0949   RDW 15.7* 04/06/2014 0949   LYMPHSABS 1.8 12/14/2014 0949   LYMPHSABS 1.4 03/18/2013 0615   MONOABS 0.5 12/14/2014 0949   MONOABS 0.3 03/18/2013 0615   EOSABS 0.0 12/14/2014 0949   EOSABS 0.0 03/18/2013 0615   BASOSABS 0.0 12/14/2014 0949   BASOSABS 0.0 03/18/2013 0615       Chemistry      Component Value Date/Time   NA 140 12/14/2014 0950   NA 139 09/07/2014 1324   K 4.7 12/14/2014 0950   K 4.8 09/07/2014 1324   CL 110 09/07/2014 1324   CL 101 06/15/2013 1034   CO2 22 12/14/2014 0950   CO2 15* 09/07/2014 1324   BUN 35.6* 12/14/2014 0950   BUN 46* 09/07/2014 1324   CREATININE 1.4* 12/14/2014 0950   CREATININE 1.20 09/07/2014 1324      Component Value Date/Time   CALCIUM 9.1 12/14/2014 0950   CALCIUM 9.1 09/07/2014 1324   ALKPHOS 70 12/14/2014 0950   ALKPHOS 80 09/07/2014 1324   AST 27 12/14/2014 0950   AST 40* 09/07/2014 1324   ALT 39 12/14/2014 0950   ALT 55* 09/07/2014 1324   BILITOT 0.34 12/14/2014 0950   BILITOT <0.2* 09/07/2014 1324     STUDIES: No results found.   ASSESSMENT: 68 y.o.  man with a history of well-differentiated lymphocytic lymphoma/ chronic lymphoid leukemia initially diagnosed in 2000, not requiring intervention until 2006; with multiple chromosomal  abnormalities.  His treatment history is as follows:  (1) fludarabine/cyclophosphamide/rituximab x5 completed May 2007.   (2) rituximab for 8 doses October 2010, with partial response   (3) Leustatin and ofatumumab weekly x8 July to September 2011 followed by maintenance ofatumumab  every 2 months, with initial response but rising counts September 2012   (4) status-post unrelated donor stem-cell transplant 02/24/2012 at the [facility]  (a) conditioning regimen consisted of fludarabine + TBI at 200 cGy, followed by rituximab x27;  (b) CMV reactivation x3 (patient CMV positive, donor negative), s/p ganciclovir treatment; 3d reactivation August 2013, s/p gancyclovir, with negative PCR mid-September 2013; last gancyclovir dose 10/06/2012 (c) Chronic GVHD: involving gut and skin, treated with steroids, tacrolimus and MMF.  MMF was eventually d/c'd and tacrolimus currently at a dose of 1.8m BID (d) atrial fibrillation: resolved on brief amiodarone regimen (e) steroid-induced myopathy: improving  (f) hypomagnesemia: improved after d/c gancyclovir, needs continuing support (g) hypogammaglobulinemia: requiring IVIG most recently 08/03/2014. (h) history of elevated triglycerides (606 on 07/14/2012)  (i) adrenal insufficiency: on prednisone and budesonide (j) pancytopenia,resolved (k) brief episode of neutropenia ([id number]) February 2015, accompanied by diarrhea; resolved   (5) restaging studies February-March 2015 including CT scans, flow cytometry, and bone marrow biopsy, showed no evidence of residual chronic lymphoid leukemia.  (6) recurrent GVHD (skin rash, mouth changes, severe diarrhea and gastric/duodenal/colonic biopsies 11/17/2012 c/w GVHD grade 2) : now grade 1 to inactive  (7)  malnutrition -- on VITAL supplement in addition to regular diet; on Marinol for anorexia  (8)  testosterone deficiency--on patch   (9) deconditioning: Especially quad weakness; continuing rehabilitation  exercises  (10) mild dehydration: encouraged increased po fluids; receives IVF support w magnesium weekly  (11) severe steroid-induced osteoporosis with compression fractures: received pamidronate 12/18/2012. Status post kyphoplasty at L3-4 in June 2014. Also with evidence of rib fractures and insufficiency fractures bilaterally of the sacral  alae, noted by CT in March 2015. --   Denosumab started 12/08/2013, given as prolia Q6 months which is what has been approved by his insurance, most recent dose 10/02/2014  (12) chronic back pain and hip pain controlled with OxyContin and hydrocodone/APAP.  (12) nausea: well controlled on current meds  (13)  Positive c.diff, 03/08/2013, on Flagyl 500 mg TID x 20 days, then on oral vanco with Questran, showing improvement; positive when repeated April 2014; Negative x 3 since then  (14) persistently increased BUN and potassium  (15)  Hypertension, on labetalol, cardizem, lisinopril, and furosemide; managed by Dr. Brigitte Pulse  (16) steroid induced hyperglycemia, on sterlix and 70/30 insulin, followed by Dr. Loanne Drilling and Dr. Brigitte Pulse  (17) hypogammaglobulinemia-- requiring intermittent supplementation, most recent dose 10/19/2014  (18) squamous cell CA in situ removed from left parietal scalp October 2014, second lesion to be removed may 2015   PLAN: Arvie looks well today. The labs were reviewed in detail and were entirely stable. He will proceed with IV fluids plus magnesium after this visit.   He will continue to manage his bowel movements with the questran powder and imodium.  His graft-versus-host disease is well controlled at this time. There will be no changes made to his prednisone or tacrolimus doses.   Izaih will continue with his fluids with magnesium weekly and his visit every 2 weeks. He understands and agrees with this plan. He has has been encouraged to call with any issues that might arise before his next visit here.  [name],  NP 12/14/2014

## 2014-12-14 NOTE — Patient Instructions (Signed)
Hypomagnesemia Magnesium is a common ion (mineral) in the body which is needed for metabolism. It is about how the body handles food and other chemical reactions necessary for life. Only about 2% of the magnesium in our body is found in the blood. When this is low, it is called hypomagnesemia. The blood will measure only a tiny amount of the magnesium in our body. When it is low in our blood, it does not mean that the whole body supply is low. The normal serum concentration ranges from 1.8-2.5 mEq/L. When the level gets to be less than 1.0 mEq/L, a number of problems begin to happen.  CAUSES   Receiving intravenous fluids without magnesium replacement.  Loss of magnesium from the bowel by nasogastric suction.  Loss of magnesium from nausea and vomiting or severe diarrhea. Any of the inflammatory bowel conditions can cause this.  Abuse of alcohol often leads to low serum magnesium.  An inherited form of magnesium loss happens when the kidneys lose magnesium. This is called familial or primary hypomagnesemia.  Some medications such as diuretics also cause the loss of magnesium. SYMPTOMS  These following problems are worse if the changes in magnesium levels come on suddenly.  Tremor.  Confusion.  Muscle weakness.  Oversensitive to sights and sounds.  Sensitive reflexes.  Depression.  Muscular fibrillations.  Overreactivity of the nerves.  Irritability.  Psychosis.  Spasms of the hand muscles.  Tetany (where the muscles go into uncontrollable spasms). DIAGNOSIS  This condition can be diagnosed by blood tests. TREATMENT   In an emergency, magnesium can be given intravenously (by vein).  If the condition is less worrisome, it can be corrected by diet. High levels of magnesium are found in green leafy vegetables, peas, beans, and nuts among other things. It can also be given through medications by mouth.  If it is being caused by medications, changes can be made.  If  alcohol is a problem, help is available if there are difficulties giving it up. Document Released: 09/03/2005 Document Revised: 04/24/2014 Document Reviewed: 07/28/2008 [employer] Patient Information 2015 [employer]. This information is not intended to replace advice given to you by your health care provider. Make sure you discuss any questions you have with your health care provider.  

## 2014-12-14 NOTE — Telephone Encounter (Signed)
Per staff message and POF I have scheduled appts. Advised scheduler of appts. JMW  

## 2014-12-20 ENCOUNTER — Telehealth: Payer: Self-pay | Admitting: Oncology

## 2014-12-20 NOTE — Telephone Encounter (Signed)
pt cld left vm wanting to know if labs were set up b4 iv-adv pt per sch he had labs sch b4 ALL appts per Val-pt understood

## 2014-12-21 ENCOUNTER — Other Ambulatory Visit (HOSPITAL_BASED_OUTPATIENT_CLINIC_OR_DEPARTMENT_OTHER): Payer: BC Managed Care – PPO

## 2014-12-21 ENCOUNTER — Other Ambulatory Visit: Payer: Self-pay | Admitting: Emergency Medicine

## 2014-12-21 ENCOUNTER — Ambulatory Visit (HOSPITAL_BASED_OUTPATIENT_CLINIC_OR_DEPARTMENT_OTHER): Payer: BC Managed Care – PPO

## 2014-12-21 DIAGNOSIS — C9110 Chronic lymphocytic leukemia of B-cell type not having achieved remission: Secondary | ICD-10-CM

## 2014-12-21 LAB — COMPREHENSIVE METABOLIC PANEL (CC13)
ALK PHOS: 82 U/L (ref 40–150)
ALT: 38 U/L (ref 0–55)
ANION GAP: 10 meq/L (ref 3–11)
AST: 30 U/L (ref 5–34)
Albumin: 3.8 g/dL (ref 3.5–5.0)
BUN: 49.2 mg/dL — AB (ref 7.0–26.0)
CHLORIDE: 110 meq/L — AB (ref 98–109)
CO2: 19 mEq/L — ABNORMAL LOW (ref 22–29)
Calcium: 9 mg/dL (ref 8.4–10.4)
Creatinine: 1.5 mg/dL — ABNORMAL HIGH (ref 0.7–1.3)
EGFR: 48 mL/min/{1.73_m2} — ABNORMAL LOW (ref >90)
GLUCOSE: 143 mg/dL — AB (ref 70–140)
POTASSIUM: 5 meq/L (ref 3.5–5.1)
Sodium: 138 mEq/L (ref 136–145)
Total Bilirubin: 0.42 mg/dL (ref 0.20–1.20)
Total Protein: 6.1 g/dL — ABNORMAL LOW (ref 6.4–8.3)

## 2014-12-21 LAB — CBC WITH DIFFERENTIAL/PLATELET
BASO%: 0.4 % (ref 0.0–2.0)
Basophils Absolute: 0 10*3/uL (ref 0.0–0.1)
EOS%: 0.3 % (ref 0.0–7.0)
Eosinophils Absolute: 0 10*3/uL (ref 0.0–0.5)
HCT: 40.6 % (ref 38.4–49.9)
HGB: 12.8 g/dL — ABNORMAL LOW (ref 13.0–17.1)
LYMPH%: 35.5 % (ref 14.0–49.0)
MCH: 29.7 pg (ref 27.2–33.4)
MCHC: 31.6 g/dL — ABNORMAL LOW (ref 32.0–36.0)
MCV: 93.9 fL (ref 79.3–98.0)
MONO#: 0.5 10*3/uL (ref 0.1–0.9)
MONO%: 8.7 % (ref 0.0–14.0)
NEUT#: 3.1 10*3/uL (ref 1.5–6.5)
NEUT%: 55.1 % (ref 39.0–75.0)
Platelets: 164 10*3/uL (ref 140–400)
RBC: 4.32 10*6/uL (ref 4.20–5.82)
RDW: 15.5 % — ABNORMAL HIGH (ref 11.0–14.6)
WBC: 5.7 10*3/uL (ref 4.0–10.3)
lymph#: 2 10*3/uL (ref 0.9–3.3)

## 2014-12-21 LAB — MAGNESIUM (CC13): MAGNESIUM: 1.9 mg/dL (ref 1.5–2.5)

## 2014-12-21 MED ORDER — SODIUM CHLORIDE 0.9 % IV SOLN
1.0000 g | Freq: Once | INTRAVENOUS | Status: AC
  Administered 2014-12-21: 1 g via INTRAVENOUS
  Filled 2014-12-21: qty 2

## 2014-12-21 MED ORDER — SODIUM CHLORIDE 0.9 % IJ SOLN
10.0000 mL | Freq: Once | INTRAMUSCULAR | Status: AC
  Administered 2014-12-21: 10 mL via INTRAVENOUS
  Filled 2014-12-21: qty 10

## 2014-12-21 MED ORDER — GLUCOSE BLOOD VI STRP: ORAL_STRIP | Status: DC

## 2014-12-21 MED ORDER — HEPARIN SOD (PORK) LOCK FLUSH 100 UNIT/ML IV SOLN
500.0000 [IU] | Freq: Once | INTRAVENOUS | Status: AC | PRN
  Administered 2014-12-21: 500 [IU]
  Filled 2014-12-21: qty 5

## 2014-12-21 NOTE — Patient Instructions (Signed)
Dehydration, Adult Dehydration is when you lose more fluids from the body than you take in. Vital organs like the kidneys, brain, and heart cannot function without a proper amount of fluids and salt. Any loss of fluids from the body can cause dehydration.  CAUSES   Vomiting.  Diarrhea.  Excessive sweating.  Excessive urine output.  Fever. SYMPTOMS  Mild dehydration  Thirst.  Dry lips.  Slightly dry mouth. Moderate dehydration  Very dry mouth.  Sunken eyes.  Skin does not bounce back quickly when lightly pinched and released.  Dark urine and decreased urine production.  Decreased tear production.  Headache. Severe dehydration  Very dry mouth.  Extreme thirst.  Rapid, weak pulse (more than 100 beats per minute at rest).  Cold hands and feet.  Not able to sweat in spite of heat and temperature.  Rapid breathing.  Blue lips.  Confusion and lethargy.  Difficulty being awakened.  Minimal urine production.  No tears. DIAGNOSIS  Your caregiver will diagnose dehydration based on your symptoms and your exam. Blood and urine tests will help confirm the diagnosis. The diagnostic evaluation should also identify the cause of dehydration. TREATMENT  Treatment of mild or moderate dehydration can often be done at home by increasing the amount of fluids that you drink. It is best to drink small amounts of fluid more often. Drinking too much at one time can make vomiting worse. Refer to the home care instructions below. Severe dehydration needs to be treated at the hospital where you will probably be given intravenous (IV) fluids that contain water and electrolytes. HOME CARE INSTRUCTIONS   Ask your caregiver about specific rehydration instructions.  Drink enough fluids to keep your urine clear or pale yellow.  Drink small amounts frequently if you have nausea and vomiting.  Eat as you normally do.  Avoid:  Foods or drinks high in sugar.  Carbonated  drinks.  Juice.  Extremely hot or cold fluids.  Drinks with caffeine.  Fatty, greasy foods.  Alcohol.  Tobacco.  Overeating.  Gelatin desserts.  Wash your hands well to avoid spreading bacteria and viruses.  Only take over-the-counter or prescription medicines for pain, discomfort, or fever as directed by your caregiver.  Ask your caregiver if you should continue all prescribed and over-the-counter medicines.  Keep all follow-up appointments with your caregiver. SEEK MEDICAL CARE IF:  You have abdominal pain and it increases or stays in one area (localizes).  You have a rash, stiff neck, or severe headache.  You are irritable, sleepy, or difficult to awaken.  You are weak, dizzy, or extremely thirsty. SEEK IMMEDIATE MEDICAL CARE IF:   You are unable to keep fluids down or you get worse despite treatment.  You have frequent episodes of vomiting or diarrhea.  You have blood or green matter (bile) in your vomit.  You have blood in your stool or your stool looks black and tarry.  You have not urinated in 6 to 8 hours, or you have only urinated a small amount of very dark urine.  You have a fever.  You faint. MAKE SURE YOU:   Understand these instructions.  Will watch your condition.  Will get help right away if you are not doing well or get worse. Document Released: 12/08/2005 Document Revised: 03/01/2012 Document Reviewed: 07/28/2011 ExitCare Patient Information 2015 [employer]. This information is not intended to replace advice given to you by your health care provider. Make sure you discuss any questions you have with your health care   provider.  

## 2014-12-26 ENCOUNTER — Other Ambulatory Visit: Payer: Self-pay | Admitting: Oncology

## 2014-12-29 ENCOUNTER — Ambulatory Visit (HOSPITAL_BASED_OUTPATIENT_CLINIC_OR_DEPARTMENT_OTHER): Payer: BC Managed Care – PPO | Admitting: Oncology

## 2014-12-29 ENCOUNTER — Ambulatory Visit (HOSPITAL_BASED_OUTPATIENT_CLINIC_OR_DEPARTMENT_OTHER): Payer: BC Managed Care – PPO

## 2014-12-29 ENCOUNTER — Other Ambulatory Visit (HOSPITAL_BASED_OUTPATIENT_CLINIC_OR_DEPARTMENT_OTHER): Payer: BC Managed Care – PPO

## 2014-12-29 VITALS — BP 126/73 | HR 71 | Temp 98.0°F | Resp 18 | Ht 66.0 in | Wt 215.3 lb

## 2014-12-29 DIAGNOSIS — T8609 Other complications of bone marrow transplant: Secondary | ICD-10-CM

## 2014-12-29 DIAGNOSIS — D89811 Chronic graft-versus-host disease: Secondary | ICD-10-CM

## 2014-12-29 DIAGNOSIS — G8929 Other chronic pain: Secondary | ICD-10-CM

## 2014-12-29 DIAGNOSIS — M810 Age-related osteoporosis without current pathological fracture: Secondary | ICD-10-CM

## 2014-12-29 DIAGNOSIS — D899 Disorder involving the immune mechanism, unspecified: Secondary | ICD-10-CM

## 2014-12-29 DIAGNOSIS — D849 Immunodeficiency, unspecified: Secondary | ICD-10-CM

## 2014-12-29 DIAGNOSIS — M549 Dorsalgia, unspecified: Principal | ICD-10-CM

## 2014-12-29 DIAGNOSIS — Z9489 Other transplanted organ and tissue status: Secondary | ICD-10-CM

## 2014-12-29 DIAGNOSIS — E099 Drug or chemical induced diabetes mellitus without complications: Secondary | ICD-10-CM

## 2014-12-29 DIAGNOSIS — C9110 Chronic lymphocytic leukemia of B-cell type not having achieved remission: Secondary | ICD-10-CM

## 2014-12-29 DIAGNOSIS — T380X5A Adverse effect of glucocorticoids and synthetic analogues, initial encounter: Secondary | ICD-10-CM

## 2014-12-29 LAB — MAGNESIUM (CC13): Magnesium: 1.7 mg/dl (ref 1.5–2.5)

## 2014-12-29 LAB — COMPREHENSIVE METABOLIC PANEL (CC13)
ALBUMIN: 3.7 g/dL (ref 3.5–5.0)
ALK PHOS: 85 U/L (ref 40–150)
ALT: 55 U/L (ref 0–55)
AST: 32 U/L (ref 5–34)
Anion Gap: 7 mEq/L (ref 3–11)
BILIRUBIN TOTAL: 0.42 mg/dL (ref 0.20–1.20)
BUN: 47.1 mg/dL — AB (ref 7.0–26.0)
CALCIUM: 8.9 mg/dL (ref 8.4–10.4)
CO2: 21 meq/L — AB (ref 22–29)
Chloride: 112 mEq/L — ABNORMAL HIGH (ref 98–109)
Creatinine: 1.3 mg/dL (ref 0.7–1.3)
EGFR: 55 mL/min/{1.73_m2} — AB (ref >90)
Glucose: 151 mg/dl — ABNORMAL HIGH (ref 70–140)
Potassium: 4.9 mEq/L (ref 3.5–5.1)
Sodium: 141 mEq/L (ref 136–145)
TOTAL PROTEIN: 5.8 g/dL — AB (ref 6.4–8.3)

## 2014-12-29 LAB — CBC WITH DIFFERENTIAL/PLATELET
BASO%: 0.2 % (ref 0.0–2.0)
Basophils Absolute: 0 10*3/uL (ref 0.0–0.1)
EOS ABS: 0 10*3/uL (ref 0.0–0.5)
EOS%: 0.8 % (ref 0.0–7.0)
HCT: 39.3 % (ref 38.4–49.9)
HGB: 12.7 g/dL — ABNORMAL LOW (ref 13.0–17.1)
LYMPH%: 30.1 % (ref 14.0–49.0)
MCH: 30 pg (ref 27.2–33.4)
MCHC: 32.3 g/dL (ref 32.0–36.0)
MCV: 92.9 fL (ref 79.3–98.0)
MONO#: 0.5 10*3/uL (ref 0.1–0.9)
MONO%: 10 % (ref 0.0–14.0)
NEUT#: 2.8 10*3/uL (ref 1.5–6.5)
NEUT%: 58.9 % (ref 39.0–75.0)
Platelets: 139 10*3/uL — ABNORMAL LOW (ref 140–400)
RBC: 4.23 10*6/uL (ref 4.20–5.82)
RDW: 15.2 % — ABNORMAL HIGH (ref 11.0–14.6)
WBC: 4.7 10*3/uL (ref 4.0–10.3)
lymph#: 1.4 10*3/uL (ref 0.9–3.3)

## 2014-12-29 MED ORDER — HEPARIN SOD (PORK) LOCK FLUSH 100 UNIT/ML IV SOLN
500.0000 [IU] | Freq: Once | INTRAVENOUS | Status: AC | PRN
  Administered 2014-12-29: 500 [IU]
  Filled 2014-12-29: qty 5

## 2014-12-29 MED ORDER — MAGNESIUM SULFATE 50 % IJ SOLN
1.0000 g | Freq: Once | INTRAMUSCULAR | Status: AC
  Administered 2014-12-29: 1 g via INTRAVENOUS
  Filled 2014-12-29: qty 2

## 2014-12-29 MED ORDER — SODIUM CHLORIDE 0.9 % IV SOLN
Freq: Once | INTRAVENOUS | Status: AC
  Administered 2014-12-29: 13:00:00 via INTRAVENOUS

## 2014-12-29 MED ORDER — SODIUM CHLORIDE 0.9 % IJ SOLN
10.0000 mL | Freq: Once | INTRAMUSCULAR | Status: AC
  Administered 2014-12-29: 10 mL
  Filled 2014-12-29: qty 10

## 2014-12-29 NOTE — Progress Notes (Signed)
ID: [name]   DOB: October 17, 1946  MR#: [id number]  WUJ#:[id number]  NFA:OZHY, [name], MD SU: OTHER MD: [name]; [name], MD; [name], [name],MD; [name], MD; [facility] in [place], [place]:  [name], RN [phone])  CHIEF COMPLAINT:  CLL, status post allogeneic stem cell transplant, GVHD CURRENT TREATMENT: Immunosuppression  HISTORY OF CLL: From the original intake note:  We have very complete records from Dr. Racheal Patches in [place], and in summary:  The patient was initially diagnosed in August 2000, with a white cell count of 23,600, but normal hemoglobin and platelets, and no significant symptomatology. Over the next several years his white cell count drifted up, and he eventually developed some symptoms of night sweats in particular, leading to treatment with fludarabine, Cytoxan and rituxan for five cycles given between December 2006 and May 2007.  We have CT scans from June 2006, November 2006 and April 2007, and comparing the November 2006 and April 2007 scans, there was near complete response. He had subsequent therapy in [place] as detailed below, but with decreased response, leading to allogeneic stem-cell transplant at the [facility] 02/24/2012.  Subsequent history is as detailed below.  INTERVAL HISTORY: Zahid returns today for follow up of his chronic lymphoid leukemia and graft-versus-host disease accompanied by [name] his wife. The semester just started and he is doing a lot of reading relating to that. He tells me when he had his cataract removed the lens placed was 44 vision but he now wishes he did not have to wear reading glasses all the time.  REVIEW OF SYSTEMS: Delante got a "steps APP" for Christmas and he has been able to quantitate how many steps he does most days. His average is about 4000. He is very motivated to increase this. He still has 4-5 bowel movements a day,  mostly soft, although slight before. He does have bowel movements at night. She usually takes Questran in the morning but not in the evening. I encouraged him to take it around 8 PM every night as well as in the morning. He has no new rash and no mouth sores. He still very shaky of course. He is now more able than before to get up from a sitting position without pushing off. A detailed review of systems was otherwise stable  PAST MEDICAL HISTORY: Past Medical History  Diagnosis Date  . 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 Diverticular disease   . Hyperlipidemia   . Obesity   . Hypertension   . Hiatal hernia   . CMV (cytomegalovirus) antibody positive     pre-transplant, with seroconversion x2 pst-transplant  . Right bundle branch block     pre-transplant  . CKD (chronic kidney disease) stage 2, GFR 60-89 ml/min   . Pancytopenia   . Steroid-induced diabetes   . Atrial fibrillation     post-transplant  . Myopathy   . 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 Leukemia, chronic lymphoid   . 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 Diarrhea in adult patient 12/05/2012    Due to active GVHD  . CLL (chronic lymphocytic leukemia) 12/05/2012    Dx 07/1999; started Rx 12/06  AlloBMT 3/13  . Rash of face 12/05/2012    Due to GVHD  . Hypomagnesemia 01/26/2013  . Left hip pain 12/01/2013    PAST SURGICAL HISTORY: Past Surgical  History  Procedure Laterality Date  . Tonsillectomy and adenoidectomy    . Bone marrow transplant    . Flexible sigmoidoscopy  11/17/2012    Procedure: FLEXIBLE SIGMOIDOSCOPY;  Surgeon: Jeryl Columbia, MD;  Location: [facility] END[facility]COPY;  Service: Endoscopy;  Laterality: N/A;  Dr Watt Climes states will be admitted to rooom 1339 11/16/12  . Esophagogastroduodenoscopy  11/17/2012    Procedure: ESOPHAGOGASTRODUODENOSCOPY (EGD);  Surgeon: Jeryl Columbia, MD;  Location: [facility];  Service: Endoscopy;  Laterality: N/A;    FAMILY HISTORY Family History  Problem Relation Age of Onset  . Cancer Father   The patient's father died from complications of chronic lymphocytic leukemia at the age of 61.  It had been diagnosed seven years before when he was 98.  The patient is enrolled in a familial chronic lymphocytic leukemia study out of the [employer].  The patient's mother is 44, alive, unfortunately suffering with dementia, and he has a brother, 78, who is otherwise in fair health.   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  His wife of >40 years, [name], is a homemaker.  Their daughter, [name], lives in [place].  She also is a Agricultural engineer.  The patient has an 75 year old grandson and an 20-year-old granddaughter, and that is really the main reason he moved to this area.  He is a Tourist information centre manager.     ADVANCED DIRECTIVES: In place  HEALTH MAINTENANCE: (Updated 04/13/2014) History  Substance Use Topics  . Smoking status: Never Smoker   . Smokeless tobacco: Never Used  . Alcohol Use: No     Colonoscopy: Nov 2013, Dr. Watt Climes  PSA: Not on file  Bone density:  Feb 2014;  Patient also has known insufficiency and pathologic fractures in addition to his long-standing history of steroid use.  Lipid panel: Jan 2015, elevated    Allergies  Allergen Reactions  . Benadryl [Diphenhydramine Hcl]     "Restless leg syndrome"    Current Outpatient Prescriptions  Medication Sig Dispense Refill  . acyclovir (ZOVIRAX) 400 MG tablet Take 2 tablets (800 mg total) by mouth 2 (two) times daily. 120 tablet 3  . atorvastatin (LIPITOR) 10 MG tablet Take 10 mg by mouth daily.    Marland Kitchen atorvastatin (LIPITOR) 40 MG tablet   6  . budesonide (ENTOCORT  EC) 3 MG 24 hr capsule Take 1 capsule (3 mg total) by mouth 3 (three) times daily. 90 capsule 6  . CARTIA XT 240 MG 24 hr capsule TAKE 1 CAPSULE BY MOUTH ONCE DAILY 30 capsule 3  . cholestyramine (QUESTRAN) 4 G packet Take 1 packet (4 g total) by mouth daily. 30 each 12  . diltiazem (DILACOR XR) 240 MG 24 hr capsule Take 1 capsule (240 mg total) by mouth every evening. 30 capsule 5  . fluconazole (DIFLUCAN) 100 MG tablet Take 1 tablet (100 mg total) by mouth once. (Patient not taking: Reported on 12/14/2014) 30 tablet 3  . furosemide (LASIX) 20 MG tablet TAKE 1 TABLET BY MOUTH DAILY. 30 tablet 1  . glucose blood (ONE TOUCH ULTRA TEST) test strip Test before meals and at bedtime. 300 each 2  . HYDROcodone-acetaminophen (NORCO) 7.5-325 MG per tablet Take 1 tablet by mouth every 4 (four) hours as needed for moderate pain. 120 tablet 0  . Insulin  Aspart Prot & Aspart (NOVOLOG 70/30 MIX) (70-30) 100 UNIT/ML Pen Inject 10 Units into the skin 2 (two) times daily. 12 units with breakfast, 12 units with dinner    . Insulin Pen Needle (B-D UF III MINI PEN NEEDLES) 31G X 5 MM MISC Use twice daily with insulin as directed 100 each 5  . labetalol (NORMODYNE) 200 MG tablet Take 400 mg by mouth 2 (two) times daily.     Marland Kitchen lidocaine (LIDODERM) 5 % Place 1 patch onto the skin daily. Remove & Discard patch within 12 hours or as directed by MD 30 patch 0  . Lidocaine-Hydrocortisone Ace 3-0.5 % KIT Apply 1 application topically as needed (for pain). 1 each 3  . lidocaine-prilocaine (EMLA) cream Apply 1 application topically as needed. 1-2 hrs before each port access 30 g 6  . lisinopril (PRINIVIL,ZESTRIL) 10 MG tablet Take 10 mg by mouth every evening.     . loratadine (CLARITIN) 10 MG tablet Take 10 mg by mouth daily.    . Multiple Vitamin (MULTIVITAMIN WITH MINERALS) TABS tablet Take 1 tablet by mouth daily.    Marland Kitchen omeprazole (PRILOSEC) 20 MG capsule Take 1 capsule (20 mg total) by mouth daily. 30 capsule 5  . ONETOUCH  DELICA LANCETS 33I MISC TEST BEFORE MEALS AND AT BEDTIME. 300 each 0  . predniSONE (DELTASONE) 5 MG tablet Take 0.5-1 tablets (2.5-5 mg total) by mouth 2 (two) times daily. Takes 41m in the morning and 2.557min the evening. 45 tablet 2  . sertraline (ZOLOFT) 50 MG tablet ALTERNATE TAKING 1 TABLET DAILY AND 2 TABLETS DAILY 90 tablet 5  . sulfamethoxazole-trimethoprim (BACTRIM DS,SEPTRA DS) 800-160 MG per tablet Take 1 tablet by mouth daily. 30 tablet 5  . tacrolimus (PROGRAF) 0.5 MG capsule Take 3 capsules (1.5 mg total) by mouth 2 (two) times daily. 180 capsule 4  . vancomycin (VANCOCIN) 125 MG capsule     . VOLTAREN 1 % GEL Apply 2 g topically 2 (two) times daily. Applied to back    . [DISCONTINUED] insulin aspart (NOVOLOG FLEXPEN) 100 UNIT/ML SOPN FlexPen 18units sq qam, 9units sq qpm, or as directed 15 mL 1   No current facility-administered medications for this visit.   Facility-Administered Medications Ordered in Other Visits  Medication Dose Route Frequency Provider Last Rate Last Dose  . 0.9 %  sodium chloride infusion   Intravenous Continuous Amy G Berry, PA-C 500 mL/hr at 03/12/13 0900    . sodium chloride 0.9 % injection 10 mL  10 mL Intravenous PRN GuChauncey CruelMD   10 mL at 08/11/12 1606    OBJECTIVE: Middle-aged white man who appears stated age Fi91itals:   12/29/14 1144  BP: 126/73  Pulse: 71  Temp: 98 F (36.7 C)  Resp: 18  Body mass index is 34.77 kg/(m^2).  ECOG: 1 Filed Weights   12/29/14 1144  Weight: 215 lb 4.8 oz (97.659 kg)    Sclerae unicteric, pupils equal and reactive Oropharynx clear and moist-- no thrush or other lesions No cervical or supraclavicular adenopathy Lungs no rales or rhonchi Heart regular rate and rhythm Abd soft, obese, nontender, positive bowel sounds MSK no focal spinal tenderness, no upper extremity lymphedema Neuro: Baseline tremor as before, otherwise exam is nonfocal, well oriented, appropriate affect Skin: No rash noted.  Multiple ecchymoses as before  LABS:  CBC    Component Value Date/Time   WBC 4.7 12/29/2014 1106   WBC 7.1 04/06/2014 0949   RBC 4.23 12/29/2014  1106   RBC 4.06* 04/06/2014 0949   RBC 3.82* 03/16/2013 1400   HGB 12.7* 12/29/2014 1106   HGB 12.2* 04/06/2014 0949   HCT 39.3 12/29/2014 1106   HCT 38.5* 04/06/2014 0949   PLT 139* 12/29/2014 1106   PLT 144* 04/06/2014 0949   MCV 92.9 12/29/2014 1106   MCV 94.8 04/06/2014 0949   MCH 30.0 12/29/2014 1106   MCH 30.0 04/06/2014 0949   MCHC 32.3 12/29/2014 1106   MCHC 31.7 04/06/2014 0949   RDW 15.2* 12/29/2014 1106   RDW 15.7* 04/06/2014 0949   LYMPHSABS 1.4 12/29/2014 1106   LYMPHSABS 1.4 03/18/2013 0615   MONOABS 0.5 12/29/2014 1106   MONOABS 0.3 03/18/2013 0615   EOSABS 0.0 12/29/2014 1106   EOSABS 0.0 03/18/2013 0615   BASOSABS 0.0 12/29/2014 1106   BASOSABS 0.0 03/18/2013 0615       Chemistry      Component Value Date/Time   NA 138 12/21/2014 0900   NA 139 09/07/2014 1324   K 5.0 12/21/2014 0900   K 4.8 09/07/2014 1324   CL 110 09/07/2014 1324   CL 101 06/15/2013 1034   CO2 19* 12/21/2014 0900   CO2 15* 09/07/2014 1324   BUN 49.2* 12/21/2014 0900   BUN 46* 09/07/2014 1324   CREATININE 1.5* 12/21/2014 0900   CREATININE 1.20 09/07/2014 1324      Component Value Date/Time   CALCIUM 9.0 12/21/2014 0900   CALCIUM 9.1 09/07/2014 1324   ALKPHOS 82 12/21/2014 0900   ALKPHOS 80 09/07/2014 1324   AST 30 12/21/2014 0900   AST 40* 09/07/2014 1324   ALT 38 12/21/2014 0900   ALT 55* 09/07/2014 1324   BILITOT 0.42 12/21/2014 0900   BILITOT <0.2* 09/07/2014 1324     STUDIES: No results found.   ASSESSMENT: 69 y.o. [place] man with a history of well-differentiated lymphocytic lymphoma/ chronic lymphoid leukemia initially diagnosed in 2000, not requiring intervention until 2006; with multiple chromosomal abnormalities.  His treatment history is as follows:  (1) fludarabine/cyclophosphamide/rituximab x5 completed  May 2007.   (2) rituximab for 8 doses October 2010, with partial response   (3) Leustatin and ofatumumab weekly x8 July to September 2011 followed by maintenance ofatumumab  every 2 months, with initial response but rising counts September 2012   (4) status-post unrelated donor stem-cell transplant 02/24/2012 at [facility]  (a) conditioning regimen consisted of fludarabine + TBI at 200 cGy, followed by rituximab x27;  (b) CMV reactivation x3 (patient CMV positive, donor negative), s/p ganciclovir treatment; 3d reactivation August 2013, s/p gancyclovir, with negative PCR mid-September 2013; last gancyclovir dose 10/06/2012 (c) Chronic GVHD: involving gut and skin, treated with steroids, tacrolimus and MMF.  MMF was eventually d/c'd and tacrolimus currently at a dose of 1.52m BID (d) atrial fibrillation: resolved on brief amiodarone regimen (e) steroid-induced myopathy: improving  (f) hypomagnesemia: improved after d/c gancyclovir, needs continuing support (g) hypogammaglobulinemia: requiring IVIG most recently 08/03/2014. (h) history of elevated triglycerides (606 on 07/14/2012)  (i) adrenal insufficiency: on prednisone and budesonide (j) pancytopenia,resolved (k) brief episode of neutropenia ([id number]) February 2015, accompanied by diarrhea; resolved   (5) restaging studies February-March 2015 including CT scans, flow cytometry, and bone marrow biopsy, showed no evidence of residual chronic lymphoid leukemia.  (6) recurrent GVHD (skin rash, mouth changes, severe diarrhea and gastric/duodenal/colonic biopsies 11/17/2012 c/w GVHD grade 2) : now grade 1 to inactive  (7)  malnutrition -- on VITAL supplement in addition to regular diet; on Marinol for  anorexia  (8) testosterone deficiency--on patch   (9) deconditioning: Especially quad weakness; continuing rehabilitation exercises  (10) mild dehydration: encouraged increased po fluids; receives IVF support w magnesium weekly  (11) severe  steroid-induced osteoporosis with compression fractures: received pamidronate 12/18/2012. Status post kyphoplasty at L3-4 in June 2014. Also with evidence of rib fractures and insufficiency fractures bilaterally of the sacral  alae, noted by CT in March 2015. --   Denosumab started 12/08/2013, given as prolia Q6 months which is what has been approved by his insurance, most recent dose 10/02/2014  (12) chronic back pain and hip pain controlled with OxyContin and hydrocodone/APAP.  (12) nausea: well controlled on current meds  (13)  Positive c.diff, 03/08/2013, on Flagyl 500 mg TID x 20 days, then on oral vanco with Questran, showing improvement; positive when repeated April 2014; Negative x 3 since then  (14) persistently increased BUN and potassium  (15)  Hypertension, on labetalol, cardizem, lisinopril, and furosemide; managed by Dr. Brigitte Pulse  (16) steroid induced hyperglycemia, on sterlix and 70/30 insulin, followed by Dr. Loanne Drilling and Dr. Brigitte Pulse  (17) hypogammaglobulinemia-- requiring intermittent supplementation, most recent dose 10/19/2014  (18) squamous cell CA in situ removed from left parietal scalp October 2014, second lesion to be removed may 2015   PLAN: Jayveion continues to do moderately well now almost 3 years out from his allogenic transplant. He tells me he is not required to return to [facility] except that the fifth year. Because of cost issues, then, he will not be returning there until March 2018, unless of course there are some problems that may develop that require there are intervention  At this point his graft-versus-host disease appears to be stable. It is not sufficiently well-controlled that I feel we can drop his steroids or tacrolimus further. We are going to make some changes in other medications and simplify his regimen as much as possible. Specifically we discussed a taper of his Zoloft. He is currently on 50 mg one day and 100 the next. He will try to go on 50 mg daily for  the next 2 weeks. If he does well with that, he can go to 50 mg alternating with 25 mg for 2 weeks, and if he does well with that he can go to 25 mg daily for the next 2 weeks. After that he could consider taking it every other day for 2 weeks and then stop.  We are continuing his weekly fluids and magnesium supplementation. I am encouraged that he is now counting his steps and that he finds it easier to get up from a chair without pushing all. We will continue to see him on an every 2 week basis. He knows to call for any problems that may develop before his next visit here.  Chauncey Cruel, MD 12/29/2014

## 2014-12-29 NOTE — Patient Instructions (Signed)
Hypomagnesemia Magnesium is a common ion (mineral) in the body which is needed for metabolism. It is about how the body handles food and other chemical reactions necessary for life. Only about 2% of the magnesium in our body is found in the blood. When this is low, it is called hypomagnesemia. The blood will measure only a tiny amount of the magnesium in our body. When it is low in our blood, it does not mean that the whole body supply is low. The normal serum concentration ranges from 1.8-2.5 mEq/L. When the level gets to be less than 1.0 mEq/L, a number of problems begin to happen.  CAUSES   Receiving intravenous fluids without magnesium replacement.  Loss of magnesium from the bowel by nasogastric suction.  Loss of magnesium from nausea and vomiting or severe diarrhea. Any of the inflammatory bowel conditions can cause this.  Abuse of alcohol often leads to low serum magnesium.  An inherited form of magnesium loss happens when the kidneys lose magnesium. This is called familial or primary hypomagnesemia.  Some medications such as diuretics also cause the loss of magnesium. SYMPTOMS  These following problems are worse if the changes in magnesium levels come on suddenly.  Tremor.  Confusion.  Muscle weakness.  Oversensitive to sights and sounds.  Sensitive reflexes.  Depression.  Muscular fibrillations.  Overreactivity of the nerves.  Irritability.  Psychosis.  Spasms of the hand muscles.  Tetany (where the muscles go into uncontrollable spasms). DIAGNOSIS  This condition can be diagnosed by blood tests. TREATMENT   In an emergency, magnesium can be given intravenously (by vein).  If the condition is less worrisome, it can be corrected by diet. High levels of magnesium are found in green leafy vegetables, peas, beans, and nuts among other things. It can also be given through medications by mouth.  If it is being caused by medications, changes can be made.  If  alcohol is a problem, help is available if there are difficulties giving it up. Document Released: 09/03/2005 Document Revised: 04/24/2014 Document Reviewed: 07/28/2008 [employer] Patient Information 2015 [employer]. This information is not intended to replace advice given to you by your health care provider. Make sure you discuss any questions you have with your health care provider.  

## 2014-12-30 MED ORDER — SERTRALINE HCL 50 MG PO TABS: ORAL_TABLET | ORAL | Status: DC

## 2015-01-01 NOTE — Addendum Note (Signed)
Addended by: Jaci Carrel A on: 01/01/2015 10:32 AM   Modules accepted: Orders, Medications

## 2015-01-03 ENCOUNTER — Other Ambulatory Visit: Payer: Self-pay | Admitting: Oncology

## 2015-01-03 DIAGNOSIS — D89811 Chronic graft-versus-host disease: Secondary | ICD-10-CM

## 2015-01-03 DIAGNOSIS — C9110 Chronic lymphocytic leukemia of B-cell type not having achieved remission: Secondary | ICD-10-CM

## 2015-01-04 ENCOUNTER — Other Ambulatory Visit: Payer: Self-pay | Admitting: *Deleted

## 2015-01-04 ENCOUNTER — Ambulatory Visit (HOSPITAL_BASED_OUTPATIENT_CLINIC_OR_DEPARTMENT_OTHER): Payer: BC Managed Care – PPO

## 2015-01-04 ENCOUNTER — Other Ambulatory Visit (HOSPITAL_BASED_OUTPATIENT_CLINIC_OR_DEPARTMENT_OTHER): Payer: BC Managed Care – PPO

## 2015-01-04 ENCOUNTER — Other Ambulatory Visit: Payer: Self-pay | Admitting: Oncology

## 2015-01-04 DIAGNOSIS — C9110 Chronic lymphocytic leukemia of B-cell type not having achieved remission: Secondary | ICD-10-CM

## 2015-01-04 LAB — COMPREHENSIVE METABOLIC PANEL (CC13)
ALBUMIN: 3.7 g/dL (ref 3.5–5.0)
ALT: 37 U/L (ref 0–55)
ANION GAP: 6 meq/L (ref 3–11)
AST: 26 U/L (ref 5–34)
Alkaline Phosphatase: 84 U/L (ref 40–150)
BUN: 41.3 mg/dL — ABNORMAL HIGH (ref 7.0–26.0)
CALCIUM: 8.8 mg/dL (ref 8.4–10.4)
CO2: 20 mEq/L — ABNORMAL LOW (ref 22–29)
Chloride: 112 mEq/L — ABNORMAL HIGH (ref 98–109)
Creatinine: 1.3 mg/dL (ref 0.7–1.3)
EGFR: 55 mL/min/{1.73_m2} — ABNORMAL LOW (ref >90)
Glucose: 152 mg/dl — ABNORMAL HIGH (ref 70–140)
POTASSIUM: 5.6 meq/L — AB (ref 3.5–5.1)
SODIUM: 137 meq/L (ref 136–145)
Total Bilirubin: 0.32 mg/dL (ref 0.20–1.20)
Total Protein: 5.8 g/dL — ABNORMAL LOW (ref 6.4–8.3)

## 2015-01-04 LAB — CBC WITH DIFFERENTIAL/PLATELET
BASO%: 0.2 % (ref 0.0–2.0)
BASOS ABS: 0 10*3/uL (ref 0.0–0.1)
EOS%: 0.4 % (ref 0.0–7.0)
Eosinophils Absolute: 0 10*3/uL (ref 0.0–0.5)
HCT: 37.4 % — ABNORMAL LOW (ref 38.4–49.9)
HGB: 12.1 g/dL — ABNORMAL LOW (ref 13.0–17.1)
LYMPH%: 29.4 % (ref 14.0–49.0)
MCH: 29.9 pg (ref 27.2–33.4)
MCHC: 32.4 g/dL (ref 32.0–36.0)
MCV: 92.3 fL (ref 79.3–98.0)
MONO#: 0.5 10*3/uL (ref 0.1–0.9)
MONO%: 9.3 % (ref 0.0–14.0)
NEUT%: 60.7 % (ref 39.0–75.0)
NEUTROS ABS: 3 10*3/uL (ref 1.5–6.5)
Platelets: 142 10*3/uL (ref 140–400)
RBC: 4.05 10*6/uL — AB (ref 4.20–5.82)
RDW: 15.2 % — ABNORMAL HIGH (ref 11.0–14.6)
WBC: 4.9 10*3/uL (ref 4.0–10.3)
lymph#: 1.5 10*3/uL (ref 0.9–3.3)

## 2015-01-04 LAB — MAGNESIUM (CC13): Magnesium: 1.9 mg/dl (ref 1.5–2.5)

## 2015-01-04 MED ORDER — HEPARIN SOD (PORK) LOCK FLUSH 100 UNIT/ML IV SOLN
500.0000 [IU] | Freq: Once | INTRAVENOUS | Status: AC | PRN
  Administered 2015-01-04: 500 [IU]
  Filled 2015-01-04: qty 5

## 2015-01-04 MED ORDER — SODIUM CHLORIDE 0.9 % IJ SOLN
10.0000 mL | INTRAMUSCULAR | Status: DC | PRN
  Administered 2015-01-04: 10 mL via INTRAVENOUS
  Filled 2015-01-04: qty 10

## 2015-01-04 MED ORDER — SODIUM CHLORIDE 0.9 % IV SOLN
1.0000 g | Freq: Once | INTRAVENOUS | Status: AC
  Administered 2015-01-04: 1 g via INTRAVENOUS
  Filled 2015-01-04: qty 2

## 2015-01-04 NOTE — Patient Instructions (Addendum)
Hyperkalemia Hyperkalemia is when you have too much potassium in your blood. This can be a life-threatening condition. Potassium is normally removed (excreted) from the body by the kidneys. CAUSES  The potassium level in your body can become too high for the following reasons:  You take in too much potassium. You can do this by:  Using salt substitutes. They contain large amounts of potassium.  Taking potassium supplements from your caregiver. The dose may be too high for you.  Eating foods or taking nutritional products with potassium.  You excrete too little potassium. This can happen if:  Your kidneys are not functioning properly. Kidney (renal) disease is a very common cause of hyperkalemia.  You are taking medicines that lower your excretion of potassium, such as certain diuretic medicines.  You have an adrenal gland disease called Addison's disease.  You have a urinary tract obstruction, such as kidney stones.  You are on treatment to mechanically clean your blood (dialysis) and you skip a treatment.  You release a high amount of potassium from your cells into your blood. You may have a condition that causes potassium to move from your cells to your bloodstream. This can happen with:  Injury to muscles or other tissues. Most potassium is stored in the muscles.  Severe burns or infections.  Acidic blood plasma (acidosis). Acidosis can result from many diseases, such as uncontrolled diabetes. SYMPTOMS  Usually, there are no symptoms unless the potassium is dangerously high or has risen very quickly. Symptoms may include:  Irregular or very slow heartbeat.  Feeling sick to your stomach (nauseous).  Tiredness (fatigue).  Nerve problems such as tingling of the skin, numbness of the hands or feet, weakness, or paralysis. DIAGNOSIS  A simple blood test can measure the amount of potassium in your body. An electrocardiogram test of the heart can also help make the diagnosis.  The heart may beat dangerously fast or slow down and stop beating with severe hyperkalemia.  TREATMENT  Treatment depends on how bad the condition is and on the underlying cause.  If the hyperkalemia is an emergency (causing heart problems or paralysis), many different medicines can be used alone or together to lower the potassium level briefly. This may include an insulin injection even if you are not diabetic. Emergency dialysis may be needed to remove potassium from the body.  If the hyperkalemia is less severe or dangerous, the underlying cause is treated. This can include taking medicines if needed. Your prescription medicines may be changed. You may also need to take a medicine to help your body get rid of potassium. You may need to eat a diet low in potassium. HOME CARE INSTRUCTIONS   Take medicines and supplements as directed by your caregiver.  Do not take any over-the-counter medicines, supplements, natural products, herbs, or vitamins without reviewing them with your caregiver. Certain supplements and natural food products can have high amounts of potassium. Other products (such as ibuprofen) can damage weak kidneys and raise your potassium.  You may be asked to do repeat lab tests. Be sure to follow these directions.  If you have kidney disease, you may need to follow a low potassium diet. SEEK MEDICAL CARE IF:   You notice an irregular or very slow heartbeat.  You feel lightheaded.  You develop weakness that is unusual for you. SEEK IMMEDIATE MEDICAL CARE IF:   You have shortness of breath.  You have chest discomfort.  You pass out (faint). MAKE SURE YOU:   Understand   these instructions.  Will watch your condition.  Will get help right away if you are not doing well or get worse. Document Released: 11/28/2002 Document Revised: 03/01/2012 Document Reviewed: 03/15/2014 [facility] Patient Information 2015 [place], [place]. This information is not intended to replace  advice given to you by your health care provider. Make sure you discuss any questions you have with your health care provider. Hypomagnesemia Magnesium is a common ion (mineral) in the body which is needed for metabolism. It is about how the body handles food and other chemical reactions necessary for life. Only about 2% of the magnesium in our body is found in the blood. When this is low, it is called hypomagnesemia. The blood will measure only a tiny amount of the magnesium in our body. When it is low in our blood, it does not mean that the whole body supply is low. The normal serum concentration ranges from 1.8-2.5 mEq/L. When the level gets to be less than 1.0 mEq/L, a number of problems begin to happen.  CAUSES   Receiving intravenous fluids without magnesium replacement.  Loss of magnesium from the bowel by nasogastric suction.  Loss of magnesium from nausea and vomiting or severe diarrhea. Any of the inflammatory bowel conditions can cause this.  Abuse of alcohol often leads to low serum magnesium.  An inherited form of magnesium loss happens when the kidneys lose magnesium. This is called familial or primary hypomagnesemia.  Some medications such as diuretics also cause the loss of magnesium. SYMPTOMS  These following problems are worse if the changes in magnesium levels come on suddenly.  Tremor.  Confusion.  Muscle weakness.  Oversensitive to sights and sounds.  Sensitive reflexes.  Depression.  Muscular fibrillations.  Overreactivity of the nerves.  Irritability.  Psychosis.  Spasms of the hand muscles.  Tetany (where the muscles go into uncontrollable spasms). DIAGNOSIS  This condition can be diagnosed by blood tests. TREATMENT   In an emergency, magnesium can be given intravenously (by vein).  If the condition is less worrisome, it can be corrected by diet. High levels of magnesium are found in green leafy vegetables, peas, beans, and nuts among other  things. It can also be given through medications by mouth.  If it is being caused by medications, changes can be made.  If alcohol is a problem, help is available if there are difficulties giving it up. Document Released: 09/03/2005 Document Revised: 04/24/2014 Document Reviewed: 07/28/2008 [facility] Patient Information 2015 [place], [place]. This information is not intended to replace advice given to you by your health care provider. Make sure you discuss any questions you have with your health care provider.

## 2015-01-04 NOTE — Progress Notes (Signed)
Informed Dr. Virgie Dad desk nurse of elevated potassium level.  She will notify Dr. Jana Hakim.  Gave pt information on low and high potassium foods, instructing him to avoid all high potassium foods.  He verbalized understanding.

## 2015-01-11 ENCOUNTER — Ambulatory Visit (HOSPITAL_BASED_OUTPATIENT_CLINIC_OR_DEPARTMENT_OTHER): Payer: BC Managed Care – PPO | Admitting: Nurse Practitioner

## 2015-01-11 ENCOUNTER — Other Ambulatory Visit: Payer: Self-pay | Admitting: *Deleted

## 2015-01-11 ENCOUNTER — Encounter: Payer: Self-pay | Admitting: Nurse Practitioner

## 2015-01-11 ENCOUNTER — Other Ambulatory Visit (HOSPITAL_BASED_OUTPATIENT_CLINIC_OR_DEPARTMENT_OTHER): Payer: BC Managed Care – PPO

## 2015-01-11 ENCOUNTER — Ambulatory Visit (HOSPITAL_BASED_OUTPATIENT_CLINIC_OR_DEPARTMENT_OTHER): Payer: BC Managed Care – PPO

## 2015-01-11 VITALS — BP 143/72 | HR 65 | Temp 97.3°F | Resp 22 | Ht 66.0 in | Wt 213.7 lb

## 2015-01-11 DIAGNOSIS — C9110 Chronic lymphocytic leukemia of B-cell type not having achieved remission: Secondary | ICD-10-CM

## 2015-01-11 DIAGNOSIS — T8609 Other complications of bone marrow transplant: Principal | ICD-10-CM

## 2015-01-11 DIAGNOSIS — D89811 Chronic graft-versus-host disease: Secondary | ICD-10-CM

## 2015-01-11 LAB — CBC WITH DIFFERENTIAL/PLATELET
BASO%: 0.2 % (ref 0.0–2.0)
Basophils Absolute: 0 10*3/uL (ref 0.0–0.1)
EOS ABS: 0 10*3/uL (ref 0.0–0.5)
EOS%: 0 % (ref 0.0–7.0)
HEMATOCRIT: 40 % (ref 38.4–49.9)
HGB: 12.9 g/dL — ABNORMAL LOW (ref 13.0–17.1)
LYMPH%: 35.4 % (ref 14.0–49.0)
MCH: 29.7 pg (ref 27.2–33.4)
MCHC: 32.3 g/dL (ref 32.0–36.0)
MCV: 92 fL (ref 79.3–98.0)
MONO#: 0.4 10*3/uL (ref 0.1–0.9)
MONO%: 7.8 % (ref 0.0–14.0)
NEUT#: 2.6 10*3/uL (ref 1.5–6.5)
NEUT%: 56.6 % (ref 39.0–75.0)
PLATELETS: 140 10*3/uL (ref 140–400)
RBC: 4.35 10*6/uL (ref 4.20–5.82)
RDW: 15.1 % — AB (ref 11.0–14.6)
WBC: 4.6 10*3/uL (ref 4.0–10.3)
lymph#: 1.6 10*3/uL (ref 0.9–3.3)

## 2015-01-11 LAB — COMPREHENSIVE METABOLIC PANEL (CC13)
ALT: 32 U/L (ref 0–55)
AST: 24 U/L (ref 5–34)
Albumin: 3.8 g/dL (ref 3.5–5.0)
Alkaline Phosphatase: 63 U/L (ref 40–150)
Anion Gap: 9 mEq/L (ref 3–11)
BUN: 40.6 mg/dL — ABNORMAL HIGH (ref 7.0–26.0)
CO2: 18 mEq/L — ABNORMAL LOW (ref 22–29)
Calcium: 8.8 mg/dL (ref 8.4–10.4)
Chloride: 113 mEq/L — ABNORMAL HIGH (ref 98–109)
Creatinine: 1.3 mg/dL (ref 0.7–1.3)
EGFR: 59 mL/min/{1.73_m2} — ABNORMAL LOW (ref >90)
Glucose: 126 mg/dl (ref 70–140)
Potassium: 5.4 mEq/L — ABNORMAL HIGH (ref 3.5–5.1)
Sodium: 140 mEq/L (ref 136–145)
TOTAL PROTEIN: 5.8 g/dL — AB (ref 6.4–8.3)
Total Bilirubin: 0.35 mg/dL (ref 0.20–1.20)

## 2015-01-11 LAB — MAGNESIUM (CC13): Magnesium: 2 mg/dl (ref 1.5–2.5)

## 2015-01-11 MED ORDER — SODIUM CHLORIDE 0.9 % IJ SOLN
10.0000 mL | INTRAMUSCULAR | Status: DC | PRN
  Administered 2015-01-11: 10 mL via INTRAVENOUS
  Filled 2015-01-11: qty 10

## 2015-01-11 MED ORDER — PROMETHAZINE HCL 25 MG/ML IJ SOLN
12.5000 mg | Freq: Once | INTRAMUSCULAR | Status: DC
  Filled 2015-01-11: qty 1

## 2015-01-11 MED ORDER — MAGNESIUM SULFATE 50 % IJ SOLN
1.0000 g | Freq: Once | INTRAMUSCULAR | Status: AC
  Administered 2015-01-11: 1 g via INTRAVENOUS
  Filled 2015-01-11: qty 2

## 2015-01-11 MED ORDER — CHOLESTYRAMINE 4 G PO PACK: 4.0000 g | PACK | Freq: Two times a day (BID) | ORAL | Status: AC

## 2015-01-11 MED ORDER — HEPARIN SOD (PORK) LOCK FLUSH 100 UNIT/ML IV SOLN
500.0000 [IU] | Freq: Once | INTRAVENOUS | Status: AC | PRN
  Administered 2015-01-11: 500 [IU]
  Filled 2015-01-11: qty 5

## 2015-01-11 NOTE — Progress Notes (Signed)
ID: [name]   DOB: 23-Apr-1946  MR#: [id number]  WUJ#:[id number]  NFA:OZHY, [name], MD SU: OTHER MD: [name]; [name], MD; [name], [name],MD; [name], MD; [facility] in [place], [place]:  [name], RN [phone])  CHIEF COMPLAINT:  CLL, status post allogeneic stem cell transplant, GVHD CURRENT TREATMENT: Immunosuppression  HISTORY OF CLL: From the original intake note:  We have very complete records from Dr. Racheal Patches in [place], and in summary:  The patient was initially diagnosed in August 2000, with a white cell count of 23,600, but normal hemoglobin and platelets, and no significant symptomatology. Over the next several years his white cell count drifted up, and he eventually developed some symptoms of night sweats in particular, leading to treatment with fludarabine, Cytoxan and rituxan for five cycles given between December 2006 and May 2007.  We have CT scans from June 2006, November 2006 and April 2007, and comparing the November 2006 and April 2007 scans, there was near complete response. He had subsequent therapy in [place] as detailed below, but with decreased response, leading to [facility]ogeneic [facility].  Subsequent history is as detailed below.  INTERVAL HISTORY: Sidharth returns today for follow up of his chronic lymphoid leukemia and graft-versus-host disease accompanied his wife, [name]. His main complaint this week is an increased "looseness" to his bowel movements. He is having them no more often than previously, 4-5 times daily, but now they lack form. He has been using questran powder to control his bowels previously, but has been adding an extra dose of imodium lately which helps. The only changes to any of his medications in the last 2 weeks is zoloft. He has been weaning himself off of that drug by alternating $RemoveBefore'75mg'QpDnMTPkOwnXd$  pills with 50 mg  tabs. He denies changes in his mood or outlook. Roderick also has a suspicious lesion to his right temple, that his dermatologist is going to remove on Monday.  REVIEW OF SYSTEMS: Brayan denies fevers, chills ,nausea, or vomiting. Hie is eating and drinking well. He has no pain today. His quad strength improves every day. He denies shortness of breath, chest pain, cough, palpitations, or fatigue. He would sleep better if he weren't up so often at night using the bathroom. His PCP plans to switch up some of his insulin regimen starting next week. A detailed review of systems is otherwise stable.  PAST MEDICAL HISTORY: Past Medical History  Diagnosis Date  . 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 Diverticular disease   . Hyperlipidemia   . Obesity   . Hypertension   . Hiatal hernia   . CMV (cytomegalovirus) antibody positive     pre-transplant, with seroconversion x2 pst-transplant  . Right bundle branch block     pre-transplant  . CKD (chronic kidney disease) stage 2, GFR 60-89 ml/min   . Pancytopenia   . Steroid-induced diabetes   . Atrial fibrillation     post-transplant  . Myopathy   . 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 Leukemia, chronic lymphoid   . 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 Diarrhea in adult patient 12/05/2012    Due to active GVHD  . CLL (chronic lymphocytic leukemia) 12/05/2012    Dx 07/1999; started Rx 12/06  AlloBMT 3/13  . Rash of face 12/05/2012    Due to GVHD  .  Hypomagnesemia 01/26/2013  . Left hip pain 12/01/2013    PAST SURGICAL HISTORY: Past Surgical History  Procedure Laterality Date  . Tonsillectomy and adenoidectomy    . Bone marrow transplant    . Flexible sigmoidoscopy  11/17/2012    Procedure: FLEXIBLE SIGMOIDOSCOPY;  Surgeon: Jeryl Columbia, MD;  Location: [facility];  Service: Endoscopy;  Laterality: N/A;  Dr Watt Climes states will be  admitted to [facility] 1339 11/16/12  . Esophagogastroduodenoscopy  11/17/2012    Procedure: ESOPHAGOGASTRODUODENOSCOPY (EGD);  Surgeon: Jeryl Columbia, MD;  Location: [facility];  Service: Endoscopy;  Laterality: N/A;    FAMILY HISTORY Family History  Problem Relation Age of Onset  . Cancer Father   The patient's father died from complications of chronic lymphocytic leukemia at the age of 33.  It had been diagnosed seven years before when he was 64.  The patient is enrolled in a familial chronic lymphocytic leukemia study out of the [employer].  The patient's mother is 76, alive, unfortunately suffering with dementia, and he has a brother, 87, who is otherwise in fair health.   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  His wife of >40 years, [name], is a homemaker.  Their daughter, [name], lives in [place].  She also is a Agricultural engineer.  The patient has an 52 year old grandson and an 19-year-old granddaughter, and that is really the main reason he moved to this area.  He is a Tourist information centre manager.     ADVANCED DIRECTIVES: In place  HEALTH MAINTENANCE: (Updated 04/13/2014) History  Substance Use Topics  . Smoking status: Never Smoker   . Smokeless tobacco: Never Used  . Alcohol Use: No     Colonoscopy: Nov 2013, Dr. Watt Climes  PSA: Not on file  Bone density:  Feb 2014;  Patient also has known insufficiency and pathologic fractures in addition to his long-standing history of steroid use.  Lipid panel: Jan 2015, elevated    Allergies  Allergen Reactions  . Benadryl [Diphenhydramine Hcl]     "Restless leg syndrome"    Current Outpatient Prescriptions  Medication Sig Dispense Refill  . acyclovir (ZOVIRAX) 400 MG tablet TAKE 2 TABLETS BY MOUTH TWICE DAILY 120 tablet  1  . budesonide (ENTOCORT EC) 3 MG 24 hr capsule Take 1 capsule (3 mg total) by mouth 3 (three) times daily. 90 capsule 6  . CARTIA XT 240 MG 24 hr capsule TAKE 1 CAPSULE BY MOUTH ONCE DAILY 30 capsule 3  . diltiazem (DILACOR XR) 240 MG 24 hr capsule Take 1 capsule (240 mg total) by mouth every evening. 30 capsule 5  . fluconazole (DIFLUCAN) 100 MG tablet Take 1 tablet (100 mg total) by mouth once. 30 tablet 3  . furosemide (LASIX) 20 MG tablet TAKE 1 TABLET BY MOUTH DAILY. 30 tablet 1  . glucose blood (ONE TOUCH ULTRA TEST) test strip Test before meals and at bedtime. 300 each 2  . insulin aspart (NOVOLOG) 100 unit/mL injection Inject 8 Units into the skin 3 (three) times daily before meals.    . Insulin Detemir (LEVEMIR) 100 UNIT/ML Pen Inject 24 Units into the skin daily at 10 pm.    . Insulin Pen Needle (B-D UF III MINI PEN NEEDLES) 31G X 5 MM MISC Use twice daily with insulin as directed 100 each  5  . labetalol (NORMODYNE) 200 MG tablet Take 400 mg by mouth 2 (two) times daily.     . Lidocaine-Hydrocortisone Ace 3-0.5 % KIT Apply 1 application topically as needed (for pain). 1 each 3  . lidocaine-prilocaine (EMLA) cream Apply 1 application topically as needed. 1-2 hrs before each port access 30 g 6  . lisinopril (PRINIVIL,ZESTRIL) 10 MG tablet Take 10 mg by mouth every evening.     . loratadine (CLARITIN) 10 MG tablet Take 10 mg by mouth daily.    . Multiple Vitamin (MULTIVITAMIN WITH MINERALS) TABS tablet Take 1 tablet by mouth daily.    Marland Kitchen omeprazole (PRILOSEC) 20 MG capsule Take 1 capsule (20 mg total) by mouth daily. 30 capsule 5  . ONETOUCH DELICA LANCETS 17C MISC TEST BEFORE MEALS AND AT BEDTIME. 300 each 0  . predniSONE (DELTASONE) 5 MG tablet Take 0.5-1 tablets (2.5-5 mg total) by mouth 2 (two) times daily. Takes $RemoveBefo'5mg'egIZENiJmUk$  in the morning and 2.$RemoveBefor'5mg'CaVXXcWNfaCV$  in the evening. 45 tablet 2  . sertraline (ZOLOFT) 50 MG tablet ALTERNATE TAKING 1 TABLET DAILY AND 2 TABLETS DAILY 90 tablet 5  .  sulfamethoxazole-trimethoprim (BACTRIM DS,SEPTRA DS) 800-160 MG per tablet Take 1 tablet by mouth daily. 30 tablet 5  . tacrolimus (PROGRAF) 0.5 MG capsule Take 3 capsules (1.5 mg total) by mouth 2 (two) times daily. 180 capsule 4  . VOLTAREN 1 % GEL Apply 2 g topically 2 (two) times daily. Applied to back    . cholestyramine (QUESTRAN) 4 G packet Take 1 packet (4 g total) by mouth 2 (two) times daily. 60 each 12  . [DISCONTINUED] insulin aspart (NOVOLOG FLEXPEN) 100 UNIT/ML SOPN FlexPen 18units sq qam, 9units sq qpm, or as directed 15 mL 1   No current facility-administered medications for this visit.   Facility-Administered Medications Ordered in Other Visits  Medication Dose Route Frequency Provider Last Rate Last Dose  . 0.9 %  sodium chloride infusion   Intravenous Continuous Amy G Berry, PA-C 500 mL/hr at 03/12/13 0900    . heparin lock flush 100 unit/mL  500 Units Intracatheter Once PRN Chauncey Cruel, MD      . promethazine (PHENERGAN) injection 12.5 mg  12.5 mg Intravenous Once Chauncey Cruel, MD   12.5 mg at 01/11/15 1250  . sodium chloride 0.9 % injection 10 mL  10 mL Intravenous PRN Chauncey Cruel, MD   10 mL at 08/11/12 1606    OBJECTIVE: Middle-aged white man who appears stated age 16 Vitals:   01/11/15 1155  BP: 143/72  Pulse: 65  Temp: 97.3 F (36.3 C)  Resp: 22  Body mass index is 34.51 kg/(m^2).  ECOG: 1 Filed Weights   01/11/15 1155  Weight: 213 lb 11.2 oz (96.934 kg)   Skin: warm, dry, lesion to right temple, scabbed over HEENT: sclerae anicteric, conjunctivae pink, oropharynx clear. No thrush or mucositis.  Lymph Nodes: No cervical or supraclavicular lymphadenopathy  Lungs: clear to auscultation bilaterally, no rales, wheezes, or rhonci  Heart: regular rate and rhythm  Abdomen: round, soft, non tender, positive bowel sounds  Musculoskeletal: No focal spinal tenderness, no peripheral edema  Neuro: non focal, well oriented, positive affect    LABS:  CBC    Component Value Date/Time   WBC 4.6 01/11/2015 1142   WBC 7.1 04/06/2014 0949   RBC 4.35 01/11/2015 1142   RBC 4.06* 04/06/2014 0949   RBC 3.82* 03/16/2013 1400   HGB 12.9* 01/11/2015 1142   HGB 12.2* 04/06/2014  0949   HCT 40.0 01/11/2015 1142   HCT 38.5* 04/06/2014 0949   PLT 140 01/11/2015 1142   PLT 144* 04/06/2014 0949   MCV 92.0 01/11/2015 1142   MCV 94.8 04/06/2014 0949   MCH 29.7 01/11/2015 1142   MCH 30.0 04/06/2014 0949   MCHC 32.3 01/11/2015 1142   MCHC 31.7 04/06/2014 0949   RDW 15.1* 01/11/2015 1142   RDW 15.7* 04/06/2014 0949   LYMPHSABS 1.6 01/11/2015 1142   LYMPHSABS 1.4 03/18/2013 0615   MONOABS 0.4 01/11/2015 1142   MONOABS 0.3 03/18/2013 0615   EOSABS 0.0 01/11/2015 1142   EOSABS 0.0 03/18/2013 0615   BASOSABS 0.0 01/11/2015 1142   BASOSABS 0.0 03/18/2013 0615       Chemistry      Component Value Date/Time   NA 140 01/11/2015 1143   NA 139 09/07/2014 1324   K 5.4* 01/11/2015 1143   K 4.8 09/07/2014 1324   CL 110 09/07/2014 1324   CL 101 06/15/2013 1034   CO2 18* 01/11/2015 1143   CO2 15* 09/07/2014 1324   BUN 40.6* 01/11/2015 1143   BUN 46* 09/07/2014 1324   CREATININE 1.3 01/11/2015 1143   CREATININE 1.20 09/07/2014 1324      Component Value Date/Time   CALCIUM 8.8 01/11/2015 1143   CALCIUM 9.1 09/07/2014 1324   ALKPHOS 63 01/11/2015 1143   ALKPHOS 80 09/07/2014 1324   AST 24 01/11/2015 1143   AST 40* 09/07/2014 1324   ALT 32 01/11/2015 1143   ALT 55* 09/07/2014 1324   BILITOT 0.35 01/11/2015 1143   BILITOT <0.2* 09/07/2014 1324     STUDIES: No results found.   ASSESSMENT: 69 y.o. [place] man with a history of well-differentiated lymphocytic lymphoma/ chronic lymphoid leukemia initially diagnosed in 2000, not requiring intervention until 2006; with multiple chromosomal abnormalities.  His treatment history is as follows:  (1) fludarabine/cyclophosphamide/rituximab x5 completed May 2007.   (2) rituximab for  8 doses October 2010, with partial response   (3) Leustatin and ofatumumab weekly x8 July to September 2011 followed by maintenance ofatumumab  every 2 months, with initial response but rising counts September 2012   (4) status-post unrelated donor stem-cell transplant 02/24/2012 at [facility]  (a) conditioning regimen consisted of fludarabine + TBI at 200 cGy, followed by rituximab x27;  (b) CMV reactivation x3 (patient CMV positive, donor negative), s/p ganciclovir treatment; 3d reactivation August 2013, s/p gancyclovir, with negative PCR mid-September 2013; last gancyclovir dose 10/06/2012 (c) Chronic GVHD: involving gut and skin, treated with steroids, tacrolimus and MMF.  MMF was eventually d/c'd and tacrolimus currently at a dose of 1.$RemoveB'5mg'uUaNgjPO$  BID (d) atrial fibrillation: resolved on brief amiodarone regimen (e) steroid-induced myopathy: improving  (f) hypomagnesemia: improved after d/c gancyclovir, needs continuing support (g) hypogammaglobulinemia: requiring IVIG most recently 08/03/2014. (h) history of elevated triglycerides (606 on 07/14/2012)  (i) adrenal insufficiency: on prednisone and budesonide (j) pancytopenia,resolved (k) brief episode of neutropenia ([place] 300) February 2015, accompanied by diarrhea; resolved   (5) restaging studies February-March 2015 including CT scans, flow cytometry, and bone marrow biopsy, showed no evidence of residual chronic lymphoid leukemia.  (6) recurrent GVHD (skin rash, mouth changes, severe diarrhea and gastric/duodenal/colonic biopsies 11/17/2012 c/w GVHD grade 2) : now grade 1 to inactive  (7)  malnutrition -- on VITAL supplement in addition to regular diet; on Marinol for anorexia  (8) testosterone deficiency--on patch   (9) deconditioning: Especially quad weakness; continuing rehabilitation exercises  (10) mild dehydration: encouraged increased po fluids;  receives IVF support w magnesium weekly  (11) severe steroid-induced osteoporosis with  compression fractures: received pamidronate 12/18/2012. Status post kyphoplasty at L3-4 in June 2014. Also with evidence of rib fractures and insufficiency fractures bilaterally of the sacral  alae, noted by CT in March 2015. --   Denosumab started 12/08/2013, given as prolia Q6 months which is what has been approved by his insurance, most recent dose 10/02/2014  (12) chronic back pain and hip pain controlled with OxyContin and hydrocodone/APAP.  (12) nausea: well controlled on current meds  (13)  Positive c.diff, 03/08/2013, on Flagyl 500 mg TID x 20 days, then on oral vanco with Questran, showing improvement; positive when repeated April 2014; Negative x 3 since then  (14) persistently increased BUN and potassium  (15)  Hypertension, on labetalol, cardizem, lisinopril, and furosemide; managed by Dr. Brigitte Pulse  (16) steroid induced hyperglycemia, on sterlix and 70/30 insulin, followed by Dr. Loanne Drilling and Dr. Brigitte Pulse  (17) hypogammaglobulinemia-- requiring intermittent supplementation, most recent dose 10/19/2014  (18) squamous cell CA in situ removed from left parietal scalp October 2014, second lesion to be removed may 2015   PLAN: [name] is doing well today. The labs were reviewed in detail and were stable. He will proceed with IV fluids plus magnesium following this visit. His graft-versus-host disease is well controlled at this time. There will be no changes made to his prednisone or tacrolimus doses.   Dao will continue to use the questran and the imodium to manage his bowels. He will alert is if they become further uncharacteristic or out of control.   Heidi will continue with his fluids with magnesium weekly and his visit every 2 weeks. He understands and agrees with this plan. He has has been encouraged to call with any issues that might arise before his next visit here.  [name], NP 01/11/2015

## 2015-01-11 NOTE — Patient Instructions (Signed)
Magnesium Sulfate injection °What is this medicine? °MAGNESIUM SULFATE (mag NEE zee um SUL fate) is an electrolyte injection commonly used to treat low magnesium levels in your blood and to prevent or control certain seizures. °This medicine may be used for other purposes; ask your health care provider or pharmacist if you have questions. °What should I tell my health care provider before I take this medicine? °They need to know if you have any of these conditions: °-heart disease °-history of irregular heart beat °-kidney disease °-an unusual or allergic reaction to magnesium sulfate, medicines, foods, dyes, or preservatives °-pregnant or trying to get pregnant °-breast-feeding °How should I use this medicine? °This medicine is for infusion into a vein. It is given by a health care professional in a hospital or clinic setting. °Talk to your pediatrician regarding the use of this medicine in children. While this drug may be prescribed for selected conditions, precautions do apply. °Overdosage: If you think you have taken too much of this medicine contact a poison control center or emergency room at once. °NOTE: This medicine is only for you. Do not share this medicine with others. °What if I miss a dose? °This does not apply. °What may interact with this medicine? °This medicine may interact with the following medications: °-certain medicines for anxiety or sleep °-certain medicines for seizures like phenobarbital °-digoxin °-medicines that relax muscles for surgery °-narcotic medicines for pain °This list may not describe all possible interactions. Give your health care provider a list of all the medicines, herbs, non-prescription drugs, or dietary supplements you use. Also tell them if you smoke, drink alcohol, or use illegal drugs. Some items may interact with your medicine. °What should I watch for while using this medicine? °Your condition will be monitored carefully while you are receiving this medicine. You  may need blood work done while you are receiving this medicine. °What side effects may I notice from receiving this medicine? °Side effects that you should report to your doctor or health care professional as soon as possible: °-allergic reactions like skin rash, itching or hives, swelling of the face, lips, or tongue °-facial flushing °-muscle weakness °-signs and symptoms of low blood pressure like dizziness; feeling faint or lightheaded, falls; unusually weak or tired °-signs and symptoms of a dangerous change in heartbeat or heart rhythm like chest pain; dizziness; fast or irregular heartbeat; palpitations; breathing problems °-sweating °This list may not describe all possible side effects. Call your doctor for medical advice about side effects. You may report side effects to FDA at 1-800-FDA-1088. °Where should I keep my medicine? °This drug is given in a hospital or clinic and will not be stored at home. °NOTE: This sheet is a summary. It may not cover all possible information. If you have questions about this medicine, talk to your doctor, pharmacist, or health care provider. °© 2015, Elsevier/Gold Standard. (2013-04-18 10:35:11) ° °

## 2015-01-15 ENCOUNTER — Other Ambulatory Visit: Payer: Self-pay | Admitting: Dermatology

## 2015-01-17 ENCOUNTER — Other Ambulatory Visit: Payer: Self-pay | Admitting: Medical Oncology

## 2015-01-17 ENCOUNTER — Other Ambulatory Visit: Payer: Self-pay | Admitting: Oncology

## 2015-01-17 DIAGNOSIS — C9110 Chronic lymphocytic leukemia of B-cell type not having achieved remission: Secondary | ICD-10-CM

## 2015-01-17 DIAGNOSIS — D89811 Chronic graft-versus-host disease: Secondary | ICD-10-CM

## 2015-01-17 DIAGNOSIS — T8609 Other complications of bone marrow transplant: Principal | ICD-10-CM

## 2015-01-17 DIAGNOSIS — K449 Diaphragmatic hernia without obstruction or gangrene: Secondary | ICD-10-CM

## 2015-01-17 MED ORDER — OMEPRAZOLE 20 MG PO CPDR: 20.0000 mg | DELAYED_RELEASE_CAPSULE | Freq: Every day | ORAL | Status: DC

## 2015-01-18 ENCOUNTER — Other Ambulatory Visit (HOSPITAL_BASED_OUTPATIENT_CLINIC_OR_DEPARTMENT_OTHER): Payer: BC Managed Care – PPO

## 2015-01-18 ENCOUNTER — Ambulatory Visit (HOSPITAL_BASED_OUTPATIENT_CLINIC_OR_DEPARTMENT_OTHER): Payer: BC Managed Care – PPO

## 2015-01-18 DIAGNOSIS — C9110 Chronic lymphocytic leukemia of B-cell type not having achieved remission: Secondary | ICD-10-CM

## 2015-01-18 LAB — COMPREHENSIVE METABOLIC PANEL (CC13)
ALT: 34 U/L (ref 0–55)
AST: 24 U/L (ref 5–34)
Albumin: 3.9 g/dL (ref 3.5–5.0)
Alkaline Phosphatase: 80 U/L (ref 40–150)
Anion Gap: 9 mEq/L (ref 3–11)
BUN: 38.5 mg/dL — ABNORMAL HIGH (ref 7.0–26.0)
CO2: 17 mEq/L — ABNORMAL LOW (ref 22–29)
Calcium: 8.6 mg/dL (ref 8.4–10.4)
Chloride: 116 mEq/L — ABNORMAL HIGH (ref 98–109)
Creatinine: 1.3 mg/dL (ref 0.7–1.3)
EGFR: 54 mL/min/{1.73_m2} — ABNORMAL LOW (ref >90)
Glucose: 101 mg/dl (ref 70–140)
POTASSIUM: 4.9 meq/L (ref 3.5–5.1)
Sodium: 141 mEq/L (ref 136–145)
Total Bilirubin: 0.29 mg/dL (ref 0.20–1.20)
Total Protein: 6.1 g/dL — ABNORMAL LOW (ref 6.4–8.3)

## 2015-01-18 LAB — CBC WITH DIFFERENTIAL/PLATELET
BASO%: 0.4 % (ref 0.0–2.0)
Basophils Absolute: 0 10*3/uL (ref 0.0–0.1)
EOS%: 0.6 % (ref 0.0–7.0)
Eosinophils Absolute: 0 10*3/uL (ref 0.0–0.5)
HEMATOCRIT: 42.2 % (ref 38.4–49.9)
HGB: 13.4 g/dL (ref 13.0–17.1)
LYMPH%: 33.1 % (ref 14.0–49.0)
MCH: 29.9 pg (ref 27.2–33.4)
MCHC: 31.8 g/dL — AB (ref 32.0–36.0)
MCV: 94.1 fL (ref 79.3–98.0)
MONO#: 0.5 10*3/uL (ref 0.1–0.9)
MONO%: 9.5 % (ref 0.0–14.0)
NEUT%: 56.4 % (ref 39.0–75.0)
NEUTROS ABS: 3 10*3/uL (ref 1.5–6.5)
Platelets: 165 10*3/uL (ref 140–400)
RBC: 4.48 10*6/uL (ref 4.20–5.82)
RDW: 16.1 % — AB (ref 11.0–14.6)
WBC: 5.3 10*3/uL (ref 4.0–10.3)
lymph#: 1.8 10*3/uL (ref 0.9–3.3)

## 2015-01-18 LAB — MAGNESIUM (CC13): Magnesium: 2.1 mg/dl (ref 1.5–2.5)

## 2015-01-18 MED ORDER — HEPARIN SOD (PORK) LOCK FLUSH 100 UNIT/ML IV SOLN
500.0000 [IU] | Freq: Once | INTRAVENOUS | Status: AC | PRN
  Administered 2015-01-18: 500 [IU]
  Filled 2015-01-18: qty 5

## 2015-01-18 MED ORDER — SODIUM CHLORIDE 0.9 % IJ SOLN
10.0000 mL | INTRAMUSCULAR | Status: DC | PRN
  Administered 2015-01-18: 10 mL via INTRAVENOUS
  Filled 2015-01-18: qty 10

## 2015-01-18 MED ORDER — SODIUM CHLORIDE 0.9 % IV SOLN
1.0000 g | Freq: Once | INTRAVENOUS | Status: AC
  Administered 2015-01-18: 1 g via INTRAVENOUS
  Filled 2015-01-18: qty 2

## 2015-01-18 NOTE — Patient Instructions (Signed)
Dehydration, Adult Dehydration is when you lose more fluids from the body than you take in. Vital organs like the kidneys, brain, and heart cannot function without a proper amount of fluids and salt. Any loss of fluids from the body can cause dehydration.  CAUSES   Vomiting.  Diarrhea.  Excessive sweating.  Excessive urine output.  Fever. SYMPTOMS  Mild dehydration  Thirst.  Dry lips.  Slightly dry mouth. Moderate dehydration  Very dry mouth.  Sunken eyes.  Skin does not bounce back quickly when lightly pinched and released.  Dark urine and decreased urine production.  Decreased tear production.  Headache. Severe dehydration  Very dry mouth.  Extreme thirst.  Rapid, weak pulse (more than 100 beats per minute at rest).  Cold hands and feet.  Not able to sweat in spite of heat and temperature.  Rapid breathing.  Blue lips.  Confusion and lethargy.  Difficulty being awakened.  Minimal urine production.  No tears. DIAGNOSIS  Your caregiver will diagnose dehydration based on your symptoms and your exam. Blood and urine tests will help confirm the diagnosis. The diagnostic evaluation should also identify the cause of dehydration. TREATMENT  Treatment of mild or moderate dehydration can often be done at home by increasing the amount of fluids that you drink. It is best to drink small amounts of fluid more often. Drinking too much at one time can make vomiting worse. Refer to the home care instructions below. Severe dehydration needs to be treated at the hospital where you will probably be given intravenous (IV) fluids that contain water and electrolytes. HOME CARE INSTRUCTIONS   Ask your caregiver about specific rehydration instructions.  Drink enough fluids to keep your urine clear or pale yellow.  Drink small amounts frequently if you have nausea and vomiting.  Eat as you normally do.  Avoid:  Foods or drinks high in sugar.  Carbonated  drinks.  Juice.  Extremely hot or cold fluids.  Drinks with caffeine.  Fatty, greasy foods.  Alcohol.  Tobacco.  Overeating.  Gelatin desserts.  Wash your hands well to avoid spreading bacteria and viruses.  Only take over-the-counter or prescription medicines for pain, discomfort, or fever as directed by your caregiver.  Ask your caregiver if you should continue all prescribed and over-the-counter medicines.  Keep all follow-up appointments with your caregiver. SEEK MEDICAL CARE IF:  You have abdominal pain and it increases or stays in one area (localizes).  You have a rash, stiff neck, or severe headache.  You are irritable, sleepy, or difficult to awaken.  You are weak, dizzy, or extremely thirsty. SEEK IMMEDIATE MEDICAL CARE IF:   You are unable to keep fluids down or you get worse despite treatment.  You have frequent episodes of vomiting or diarrhea.  You have blood or green matter (bile) in your vomit.  You have blood in your stool or your stool looks black and tarry.  You have not urinated in 6 to 8 hours, or you have only urinated a small amount of very dark urine.  You have a fever.  You faint. MAKE SURE YOU:   Understand these instructions.  Will watch your condition.  Will get help right away if you are not doing well or get worse. Document Released: 12/08/2005 Document Revised: 03/01/2012 Document Reviewed: 07/28/2011 ExitCare Patient Information 2015 [employer]. This information is not intended to replace advice given to you by your health care provider. Make sure you discuss any questions you have with your health care   provider.  

## 2015-01-25 ENCOUNTER — Inpatient Hospital Stay (HOSPITAL_COMMUNITY): Admit: 2015-01-25 | Payer: Self-pay

## 2015-01-25 ENCOUNTER — Other Ambulatory Visit (HOSPITAL_COMMUNITY)
Admission: RE | Admit: 2015-01-25 | Discharge: 2015-01-25 | Disposition: A | Discharge status: Home / Self Care | Payer: BC Managed Care – PPO | Source: Ambulatory Visit | Attending: Oncology | Admitting: Oncology

## 2015-01-25 ENCOUNTER — Telehealth: Payer: Self-pay | Admitting: *Deleted

## 2015-01-25 ENCOUNTER — Ambulatory Visit (HOSPITAL_BASED_OUTPATIENT_CLINIC_OR_DEPARTMENT_OTHER): Payer: BC Managed Care – PPO | Admitting: Nurse Practitioner

## 2015-01-25 ENCOUNTER — Encounter: Payer: Self-pay | Admitting: Nurse Practitioner

## 2015-01-25 ENCOUNTER — Ambulatory Visit (HOSPITAL_BASED_OUTPATIENT_CLINIC_OR_DEPARTMENT_OTHER): Payer: BC Managed Care – PPO

## 2015-01-25 ENCOUNTER — Other Ambulatory Visit: Payer: Self-pay | Admitting: *Deleted

## 2015-01-25 ENCOUNTER — Telehealth: Payer: Self-pay | Admitting: Nurse Practitioner

## 2015-01-25 ENCOUNTER — Other Ambulatory Visit (HOSPITAL_BASED_OUTPATIENT_CLINIC_OR_DEPARTMENT_OTHER): Payer: BC Managed Care – PPO

## 2015-01-25 VITALS — BP 122/76 | HR 70 | Temp 97.7°F | Resp 18 | Ht 66.0 in | Wt 216.5 lb

## 2015-01-25 DIAGNOSIS — I10 Essential (primary) hypertension: Secondary | ICD-10-CM

## 2015-01-25 DIAGNOSIS — D89811 Chronic graft-versus-host disease: Secondary | ICD-10-CM

## 2015-01-25 DIAGNOSIS — E860 Dehydration: Secondary | ICD-10-CM

## 2015-01-25 DIAGNOSIS — E291 Testicular hypofunction: Secondary | ICD-10-CM

## 2015-01-25 DIAGNOSIS — I4891 Unspecified atrial fibrillation: Secondary | ICD-10-CM | POA: Diagnosis not present

## 2015-01-25 DIAGNOSIS — T8609 Other complications of bone marrow transplant: Secondary | ICD-10-CM

## 2015-01-25 DIAGNOSIS — E2740 Unspecified adrenocortical insufficiency: Secondary | ICD-10-CM

## 2015-01-25 DIAGNOSIS — M549 Dorsalgia, unspecified: Secondary | ICD-10-CM

## 2015-01-25 DIAGNOSIS — C9110 Chronic lymphocytic leukemia of B-cell type not having achieved remission: Secondary | ICD-10-CM

## 2015-01-25 DIAGNOSIS — M818 Other osteoporosis without current pathological fracture: Secondary | ICD-10-CM

## 2015-01-25 DIAGNOSIS — D801 Nonfamilial hypogammaglobulinemia: Secondary | ICD-10-CM

## 2015-01-25 DIAGNOSIS — E46 Unspecified protein-calorie malnutrition: Secondary | ICD-10-CM

## 2015-01-25 DIAGNOSIS — R197 Diarrhea, unspecified: Secondary | ICD-10-CM

## 2015-01-25 DIAGNOSIS — M25559 Pain in unspecified hip: Secondary | ICD-10-CM

## 2015-01-25 LAB — CBC WITH DIFFERENTIAL/PLATELET
BASO%: 0.4 % (ref 0.0–2.0)
Basophils Absolute: 0 10*3/uL (ref 0.0–0.1)
EOS%: 0.4 % (ref 0.0–7.0)
Eosinophils Absolute: 0 10*3/uL (ref 0.0–0.5)
HCT: 40.3 % (ref 38.4–49.9)
HGB: 12.6 g/dL — ABNORMAL LOW (ref 13.0–17.1)
LYMPH%: 29.8 % (ref 14.0–49.0)
MCH: 29.5 pg (ref 27.2–33.4)
MCHC: 31.3 g/dL — ABNORMAL LOW (ref 32.0–36.0)
MCV: 94.2 fL (ref 79.3–98.0)
MONO#: 0.4 10*3/uL (ref 0.1–0.9)
MONO%: 8.5 % (ref 0.0–14.0)
NEUT#: 3 10*3/uL (ref 1.5–6.5)
NEUT%: 60.9 % (ref 39.0–75.0)
Platelets: 139 10*3/uL — ABNORMAL LOW (ref 140–400)
RBC: 4.28 10*6/uL (ref 4.20–5.82)
RDW: 16.2 % — ABNORMAL HIGH (ref 11.0–14.6)
WBC: 5 10*3/uL (ref 4.0–10.3)
lymph#: 1.5 10*3/uL (ref 0.9–3.3)

## 2015-01-25 LAB — COMPREHENSIVE METABOLIC PANEL (CC13)
ALBUMIN: 3.8 g/dL (ref 3.5–5.0)
ALK PHOS: 78 U/L (ref 40–150)
ALT: 37 U/L (ref 0–55)
ANION GAP: 8 meq/L (ref 3–11)
AST: 23 U/L (ref 5–34)
BUN: 42.5 mg/dL — AB (ref 7.0–26.0)
CALCIUM: 9.5 mg/dL (ref 8.4–10.4)
CO2: 17 mEq/L — ABNORMAL LOW (ref 22–29)
Chloride: 115 mEq/L — ABNORMAL HIGH (ref 98–109)
Creatinine: 1.2 mg/dL (ref 0.7–1.3)
EGFR: 64 mL/min/{1.73_m2} — AB (ref >90)
Glucose: 126 mg/dl (ref 70–140)
POTASSIUM: 5.6 meq/L — AB (ref 3.5–5.1)
Sodium: 140 mEq/L (ref 136–145)
TOTAL PROTEIN: 5.8 g/dL — AB (ref 6.4–8.3)
Total Bilirubin: 0.29 mg/dL (ref 0.20–1.20)

## 2015-01-25 LAB — MAGNESIUM (CC13): Magnesium: 1.9 mg/dl (ref 1.5–2.5)

## 2015-01-25 LAB — CLOSTRIDIUM DIFFICILE BY PCR: Toxigenic C. Difficile by PCR: NEGATIVE

## 2015-01-25 MED ORDER — HEPARIN SOD (PORK) LOCK FLUSH 100 UNIT/ML IV SOLN
500.0000 [IU] | Freq: Once | INTRAVENOUS | Status: DC | PRN
  Filled 2015-01-25: qty 5

## 2015-01-25 MED ORDER — SODIUM CHLORIDE 0.9 % IJ SOLN
10.0000 mL | Freq: Once | INTRAMUSCULAR | Status: AC
  Administered 2015-01-25: 10 mL via INTRAVENOUS
  Filled 2015-01-25: qty 10

## 2015-01-25 MED ORDER — HEPARIN SOD (PORK) LOCK FLUSH 100 UNIT/ML IV SOLN
500.0000 [IU] | Freq: Once | INTRAVENOUS | Status: AC
  Administered 2015-01-25: 500 [IU] via INTRAVENOUS
  Filled 2015-01-25: qty 5

## 2015-01-25 MED ORDER — TACROLIMUS 0.5 MG PO CAPS: 1.5000 mg | ORAL_CAPSULE | Freq: Two times a day (BID) | ORAL | Status: DC

## 2015-01-25 MED ORDER — FUROSEMIDE 20 MG PO TABS
20.0000 mg | ORAL_TABLET | Freq: Every day | ORAL | Status: DC
Start: 2015-01-25 — End: 2015-03-07

## 2015-01-25 MED ORDER — SODIUM CHLORIDE 0.9 % IV SOLN
1.0000 g | Freq: Once | INTRAVENOUS | Status: AC
  Administered 2015-01-25: 1 g via INTRAVENOUS
  Filled 2015-01-25: qty 2

## 2015-01-25 MED ORDER — FLUCONAZOLE 100 MG PO TABS: 100.0000 mg | ORAL_TABLET | Freq: Once | ORAL | Status: DC

## 2015-01-25 MED ORDER — PREDNISONE 10 MG PO TABS: 10.0000 mg | ORAL_TABLET | Freq: Every day | ORAL | Status: DC

## 2015-01-25 MED ORDER — LABETALOL HCL 200 MG PO TABS: 400.0000 mg | ORAL_TABLET | Freq: Two times a day (BID) | ORAL | Status: DC

## 2015-01-25 NOTE — Telephone Encounter (Signed)
Per staff message and POF I have scheduled appts. Advised scheduler of appts. JMW  

## 2015-01-25 NOTE — Telephone Encounter (Signed)
per pof to sch tp appt-sent MW emailo to sch IV-pt to get updated sch on 2/11

## 2015-01-25 NOTE — Progress Notes (Signed)
ID: Timothy [name]   DOB: May 06, 1946  MR#: [id number]  QPR#:[id number]  LDJ:TTSV, Timothy Saxon, MD SU: OTHER MD: Timothy [name]; Timothy Shin, MD; Timothy [name], Timothy Hatcher,MD; Timothy Rosenthal, MD; [facility] in [place], [place]:  Timothy Duval, RN [phone])  CHIEF COMPLAINT:  CLL, status post allogeneic stem cell transplant, GVHD CURRENT TREATMENT: Immunosuppression  HISTORY OF CLL: From the original intake note:  We have very complete records from Dr. Racheal Patches in [place], and in summary:  The patient was initially diagnosed in August 2000, with a white cell count of 23,600, but normal hemoglobin and platelets, and no significant symptomatology. Over the next several years his white cell count drifted up, and he eventually developed some symptoms of night sweats in particular, leading to treatment with fludarabine, Cytoxan and rituxan for five cycles given between December 2006 and May 2007.  We have CT scans from June 2006, November 2006 and April 2007, and comparing the November 2006 and April 2007 scans, there was near complete response. He had subsequent therapy in [place] as detailed below, but with decreased response, leading to allogeneic stem-cell transplant at the [facility] 02/24/2012.  Subsequent history is as detailed below.  INTERVAL HISTORY: Timothy [name] returns today for follow up of his chronic lymphoid leukemia and graft-versus-host disease accompanied his wife, Timothy [name]. He had the lesion to his right temple removed last week and fortunately it returned as benign. Unfortunately his diarrhea has been worse for the past week. Since midnight he has had 9 stools already. He has been using questran powder BID and imodium PRN which has been of some help. He has been slightly more fatigued. He denies fevers, night sweats, rashes, bruising, or bleeding.  REVIEW OF SYSTEMS: Timothy [name] denies, nausea, or vomiting. Hie is eating and  drinking well. He has no pain today. His quad strength improves every day. He denies shortness of breath, chest pain, cough, palpitations, or fatigue. He would sleep better if he weren't up so often at night using the bathroom, which so far has only been 1-2 hours at a time. His PCP changed his levemir insulin dosage and his blood sugars are better controlled. A detailed review of systems is otherwise stable.  PAST MEDICAL HISTORY: Past Medical History  Diagnosis Date  . 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 Diverticular disease   . Hyperlipidemia   . Obesity   . Hypertension   . Hiatal hernia   . CMV (cytomegalovirus) antibody positive     pre-transplant, with seroconversion x2 pst-transplant  . Right bundle branch block     pre-transplant  . CKD (chronic kidney disease) stage 2, GFR 60-89 ml/min   . Pancytopenia   . Steroid-induced diabetes   . Atrial fibrillation     post-transplant  . Myopathy   . 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 Leukemia, chronic lymphoid   . 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 Diarrhea in adult patient 12/05/2012    Due to active GVHD  . CLL (chronic lymphocytic leukemia) 12/05/2012    Dx 07/1999; started Rx 12/06  AlloBMT 3/13  . Rash of face 12/05/2012    Due to GVHD  . Hypomagnesemia 01/26/2013  . Left hip pain 12/01/2013    PAST SURGICAL HISTORY: Past Surgical History  Procedure Laterality Date  . Tonsillectomy and adenoidectomy    . Bone marrow transplant    .  Flexible sigmoidoscopy  11/17/2012    Procedure: FLEXIBLE SIGMOIDOSCOPY;  Surgeon: Jeryl Columbia, MD;  Location: [facility];  Service: Endoscopy;  Laterality: N/A;  Dr Watt Climes states will be admitted to rooom 1339 11/16/12  . Esophagogastroduodenoscopy  11/17/2012    Procedure: ESOPHAGOGASTRODUODENOSCOPY (EGD);  Surgeon: Jeryl Columbia, MD;  Location: [facility];  Service:  Endoscopy;  Laterality: N/A;    FAMILY HISTORY Family History  Problem Relation Age of Onset  . Cancer Father   The patient's father died from complications of chronic lymphocytic leukemia at the age of 56.  It had been diagnosed seven years before when he was 59.  The patient is enrolled in a familial chronic lymphocytic leukemia study out of the [employer].  The patient's mother is 82, alive, unfortunately suffering with dementia, and he has a brother, 34, who is otherwise in fair health.   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  His wife of >40 years, Timothy [name], is a homemaker.  Their daughter, Timothy [name], lives in [place].  She also is a Agricultural engineer.  The patient has an 64 year old grandson and an 46-year-old granddaughter, and that is really the main reason he moved to this area.  He is a Tourist information centre manager.     ADVANCED DIRECTIVES: In place  HEALTH MAINTENANCE: (Updated 04/13/2014) History  Substance Use Topics  . Smoking status: Never Smoker   . Smokeless tobacco: Never Used  . Alcohol Use: No     Colonoscopy: Nov 2013, Dr. Watt Climes  PSA: Not on file  Bone density:  Feb 2014;  Patient also has known insufficiency and pathologic fractures in addition to his long-standing history of steroid use.  Lipid panel: Jan 2015, elevated    Allergies  Allergen Reactions  . Benadryl [Diphenhydramine Hcl]     "Restless leg syndrome"    Current Outpatient Prescriptions  Medication Sig Dispense Refill  . acyclovir (ZOVIRAX) 400 MG tablet TAKE 2 TABLETS BY MOUTH TWICE DAILY 120 tablet 1  . budesonide (ENTOCORT EC) 3 MG 24 hr capsule Take 1 capsule (3 mg total) by mouth 3 (three) times daily. 90 capsule 6  . CARTIA XT 240 MG 24 hr capsule TAKE 1 CAPSULE BY MOUTH ONCE  DAILY 30 capsule 3  . cholestyramine (QUESTRAN) 4 G packet Take 1 packet (4 g total) by mouth 2 (two) times daily. 60 each 12  . diltiazem (DILACOR XR) 240 MG 24 hr capsule Take 1 capsule (240 mg total) by mouth every evening. 30 capsule 5  . furosemide (LASIX) 20 MG tablet TAKE 1 TABLET BY MOUTH ONCE DAILY 30 tablet 1  . glucose blood (ONE TOUCH ULTRA TEST) test strip Test before meals and at bedtime. 300 each 2  . insulin aspart (NOVOLOG) 100 unit/mL injection Inject 8 Units into the skin 3 (three) times daily before meals.    . Insulin Detemir (LEVEMIR) 100 UNIT/ML Pen Inject 24 Units into the skin daily at 10 pm.    . Insulin Pen Needle (B-D UF III MINI PEN NEEDLES) 31G X 5 MM MISC Use twice daily with insulin as directed 100 each 5  . Lidocaine-Hydrocortisone Ace 3-0.5 % KIT Apply 1 application topically as needed (for pain). 1 each 3  . lidocaine-prilocaine (EMLA) cream Apply 1 application topically as needed. 1-2 hrs before  each port access 30 g 6  . lisinopril (PRINIVIL,ZESTRIL) 10 MG tablet Take 10 mg by mouth every evening.     . loratadine (CLARITIN) 10 MG tablet Take 10 mg by mouth daily.    . Multiple Vitamin (MULTIVITAMIN WITH MINERALS) TABS tablet Take 1 tablet by mouth daily.    Marland Kitchen omeprazole (PRILOSEC) 20 MG capsule Take 1 capsule (20 mg total) by mouth daily. 30 capsule 5  . ONETOUCH DELICA LANCETS 87G MISC TEST BEFORE MEALS AND AT BEDTIME. 300 each 0  . predniSONE (DELTASONE) 5 MG tablet Take 0.5-1 tablets (2.5-5 mg total) by mouth 2 (two) times daily. Takes $RemoveBefo'5mg'tDEVOxfiIVG$  in the morning and 2.$RemoveBefor'5mg'CxxvEjcQorkn$  in the evening. 45 tablet 2  . sertraline (ZOLOFT) 50 MG tablet ALTERNATE TAKING 1 TABLET DAILY AND 2 TABLETS DAILY 90 tablet 5  . sulfamethoxazole-trimethoprim (BACTRIM DS,SEPTRA DS) 800-160 MG per tablet Take 1 tablet by mouth daily. 30 tablet 5  . tacrolimus (PROGRAF) 0.5 MG capsule Take 3 capsules (1.5 mg total) by mouth 2 (two) times daily. 180 capsule 4  . VOLTAREN 1 % GEL Apply 2 g  topically 2 (two) times daily. Applied to back    . fluconazole (DIFLUCAN) 100 MG tablet Take 1 tablet (100 mg total) by mouth once. 30 tablet 3  . labetalol (NORMODYNE) 200 MG tablet Take 2 tablets (400 mg total) by mouth 2 (two) times daily. 120 tablet 1  . [DISCONTINUED] insulin aspart (NOVOLOG FLEXPEN) 100 UNIT/ML SOPN FlexPen 18units sq qam, 9units sq qpm, or as directed 15 mL 1   No current facility-administered medications for this visit.   Facility-Administered Medications Ordered in Other Visits  Medication Dose Route Frequency Provider Last Rate Last Dose  . 0.9 %  sodium chloride infusion   Intravenous Continuous Amy G Berry, PA-C 500 mL/hr at 03/12/13 0900    . sodium chloride 0.9 % injection 10 mL  10 mL Intravenous PRN Chauncey Cruel, MD   10 mL at 08/11/12 1606    OBJECTIVE: Middle-aged white man who appears stated age 10 Vitals:   01/25/15 1138  BP: 122/76  Pulse: 70  Temp: 97.7 F (36.5 C)  Resp: 18  Body mass index is 34.96 kg/(m^2).  ECOG: 1 Filed Weights   01/25/15 1138  Weight: 216 lb 8 oz (98.204 kg)   Skin: warm, dry, bandage to right temple from skin lesion removal, diffuse bruising to bilateral arms HEENT: sclerae anicteric, conjunctivae pink, oropharynx clear. No thrush or mucositis.  Lymph Nodes: No cervical or supraclavicular lymphadenopathy  Lungs: clear to auscultation bilaterally, no rales, wheezes, or rhonci  Heart: regular rate and rhythm  Abdomen: round, soft, non tender, positive bowel sounds  Musculoskeletal: No focal spinal tenderness, no peripheral edema  Neuro: non focal, well oriented, positive affect   LABS:  CBC    Component Value Date/Time   WBC 5.0 01/25/2015 1125   WBC 7.1 04/06/2014 0949   RBC 4.28 01/25/2015 1125   RBC 4.06* 04/06/2014 0949   RBC 3.82* 03/16/2013 1400   HGB 12.6* 01/25/2015 1125   HGB 12.2* 04/06/2014 0949   HCT 40.3 01/25/2015 1125   HCT 38.5* 04/06/2014 0949   PLT 139* 01/25/2015 1125   PLT 144*  04/06/2014 0949   MCV 94.2 01/25/2015 1125   MCV 94.8 04/06/2014 0949   MCH 29.5 01/25/2015 1125   MCH 30.0 04/06/2014 0949   MCHC 31.3* 01/25/2015 1125   MCHC 31.7 04/06/2014 0949   RDW 16.2* 01/25/2015 1125   RDW  15.7* 04/06/2014 0949   LYMPHSABS 1.5 01/25/2015 1125   LYMPHSABS 1.4 03/18/2013 0615   MONOABS 0.4 01/25/2015 1125   MONOABS 0.3 03/18/2013 0615   EOSABS 0.0 01/25/2015 1125   EOSABS 0.0 03/18/2013 0615   BASOSABS 0.0 01/25/2015 1125   BASOSABS 0.0 03/18/2013 0615       Chemistry      Component Value Date/Time   NA 140 01/25/2015 1126   NA 139 09/07/2014 1324   K 5.6* 01/25/2015 1126   K 4.8 09/07/2014 1324   CL 110 09/07/2014 1324   CL 101 06/15/2013 1034   CO2 17* 01/25/2015 1126   CO2 15* 09/07/2014 1324   BUN 42.5* 01/25/2015 1126   BUN 46* 09/07/2014 1324   CREATININE 1.2 01/25/2015 1126   CREATININE 1.20 09/07/2014 1324      Component Value Date/Time   CALCIUM 9.5 01/25/2015 1126   CALCIUM 9.1 09/07/2014 1324   ALKPHOS 78 01/25/2015 1126   ALKPHOS 80 09/07/2014 1324   AST 23 01/25/2015 1126   AST 40* 09/07/2014 1324   ALT 37 01/25/2015 1126   ALT 55* 09/07/2014 1324   BILITOT 0.29 01/25/2015 1126   BILITOT <0.2* 09/07/2014 1324     STUDIES: No results found.   ASSESSMENT: 69 y.o. [place] man with a history of well-differentiated lymphocytic lymphoma/ chronic lymphoid leukemia initially diagnosed in 2000, not requiring intervention until 2006; with multiple chromosomal abnormalities.  His treatment history is as follows:  (1) fludarabine/cyclophosphamide/rituximab x5 completed May 2007.   (2) rituximab for 8 doses October 2010, with partial response   (3) Leustatin and ofatumumab weekly x8 July to September 2011 followed by maintenance ofatumumab  every 2 months, with initial response but rising counts September 2012   (4) status-post unrelated donor stem-cell transplant 02/24/2012 at [facility]  (a) conditioning regimen consisted  of fludarabine + TBI at 200 cGy, followed by rituximab x27;  (b) CMV reactivation x3 (patient CMV positive, donor negative), s/p ganciclovir treatment; 3d reactivation August 2013, s/p gancyclovir, with negative PCR mid-September 2013; last gancyclovir dose 10/06/2012 (c) Chronic GVHD: involving gut and skin, treated with steroids, tacrolimus and MMF.  MMF was eventually d/c'd and tacrolimus currently at a dose of 1.$RemoveB'5mg'pmWAezZK$  BID (d) atrial fibrillation: resolved on brief amiodarone regimen (e) steroid-induced myopathy: improving  (f) hypomagnesemia: improved after d/c gancyclovir, needs continuing support (g) hypogammaglobulinemia: requiring IVIG most recently 08/03/2014. (h) history of elevated triglycerides (606 on 07/14/2012)  (i) adrenal insufficiency: on prednisone and budesonide (j) pancytopenia,resolved (k) brief episode of neutropenia ([place] 300) February 2015, accompanied by diarrhea; resolved   (5) restaging studies February-March 2015 including CT scans, flow cytometry, and bone marrow biopsy, showed no evidence of residual chronic lymphoid leukemia.  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 Status post kyphoplasty at L3-4 in June 2014. Also with evidence of rib fractures and insufficiency fractures bilaterally of the sacral  alae, noted by CT in March 2015. --   Denosumab started 12/08/2013, given as prolia Q6 months which is what has been approved by his insurance,  most recent dose  10/02/2014  (12) chronic back pain and hip pain controlled with OxyContin and hydrocodone/APAP.  (12) nausea: well controlled on current meds  (13)  Positive c.diff, 03/08/2013, on Flagyl 500 mg TID x 20 days, then on oral vanco with Questran, showing improvement; positive when repeated April 2014; Negative x 3 since then  (14) persistently increased BUN and potassium  (15)  Hypertension, on labetalol, cardizem, lisinopril, and furosemide; managed by Dr. Brigitte Pulse  (16) steroid induced hyperglycemia, on sterlix and 70/30 insulin, followed by Dr. Loanne Drilling and Dr. Brigitte Pulse  (17) hypogammaglobulinemia-- requiring intermittent supplementation, most recent dose 10/19/2014  (18) squamous cell CA in situ removed from left parietal scalp October 2014, second lesion to be removed may 2015   PLAN: Diarrhea aside, Zhaire feels well. The labs were reviewed in detail and were relatively stable. His potassium persists at above normal values at 5.6 today.Derran will proceed with IV fluids plus Magenisum following this visit. I consulted with Dr. Jana Hakim regarding changes that may need to be made to his prednisone or tacrolimus. He suggested we raise his prednisone dose to $Remov'20mg'xnVSdO$  QAM and $Remov'10mg'dBkOKB$  QPM x 1 week, then drop to $Remov'20mg'QNpfTk$  daily the following week. We will test for c.diff in the meantime, though I don't believe he will return positive.   Efosa will continue with his fluids with magnesium weekly and his visit every 2 weeks. He understands and agrees with this plan. He has has been encouraged to call with any issues that might arise before his next visit here.  [name], NP 01/25/2015

## 2015-01-25 NOTE — Patient Instructions (Signed)
Hypomagnesemia Magnesium is a common ion (mineral) in the body which is needed for metabolism. It is about how the body handles food and other chemical reactions necessary for life. Only about 2% of the magnesium in our body is found in the blood. When this is low, it is called hypomagnesemia. The blood will measure only a tiny amount of the magnesium in our body. When it is low in our blood, it does not mean that the whole body supply is low. The normal serum concentration ranges from 1.8-2.5 mEq/L. When the level gets to be less than 1.0 mEq/L, a number of problems begin to happen.  CAUSES   Receiving intravenous fluids without magnesium replacement.  Loss of magnesium from the bowel by nasogastric suction.  Loss of magnesium from nausea and vomiting or severe diarrhea. Any of the inflammatory bowel conditions can cause this.  Abuse of alcohol often leads to low serum magnesium.  An inherited form of magnesium loss happens when the kidneys lose magnesium. This is called familial or primary hypomagnesemia.  Some medications such as diuretics also cause the loss of magnesium. SYMPTOMS  These following problems are worse if the changes in magnesium levels come on suddenly.  Tremor.  Confusion.  Muscle weakness.  Oversensitive to sights and sounds.  Sensitive reflexes.  Depression.  Muscular fibrillations.  Overreactivity of the nerves.  Irritability.  Psychosis.  Spasms of the hand muscles.  Tetany (where the muscles go into uncontrollable spasms). DIAGNOSIS  This condition can be diagnosed by blood tests. TREATMENT   In an emergency, magnesium can be given intravenously (by vein).  If the condition is less worrisome, it can be corrected by diet. High levels of magnesium are found in green leafy vegetables, peas, beans, and nuts among other things. It can also be given through medications by mouth.  If it is being caused by medications, changes can be made.  If  alcohol is a problem, help is available if there are difficulties giving it up. Document Released: 09/03/2005 Document Revised: 04/24/2014 Document Reviewed: 07/28/2008 [employer] Patient Information 2015 [employer]. This information is not intended to replace advice given to you by your health care provider. Make sure you discuss any questions you have with your health care provider.  

## 2015-01-26 ENCOUNTER — Telehealth: Payer: Self-pay | Admitting: Oncology

## 2015-01-26 NOTE — Telephone Encounter (Signed)
per reply from MW-clds pt to adv of iv appts-pt understood

## 2015-02-01 ENCOUNTER — Other Ambulatory Visit (HOSPITAL_BASED_OUTPATIENT_CLINIC_OR_DEPARTMENT_OTHER): Payer: BC Managed Care – PPO

## 2015-02-01 ENCOUNTER — Other Ambulatory Visit: Payer: Self-pay | Admitting: *Deleted

## 2015-02-01 ENCOUNTER — Ambulatory Visit (HOSPITAL_BASED_OUTPATIENT_CLINIC_OR_DEPARTMENT_OTHER): Payer: BC Managed Care – PPO

## 2015-02-01 DIAGNOSIS — E875 Hyperkalemia: Secondary | ICD-10-CM

## 2015-02-01 DIAGNOSIS — C9110 Chronic lymphocytic leukemia of B-cell type not having achieved remission: Secondary | ICD-10-CM

## 2015-02-01 LAB — CBC WITH DIFFERENTIAL/PLATELET
BASO%: 0.3 % (ref 0.0–2.0)
BASOS ABS: 0 10*3/uL (ref 0.0–0.1)
EOS%: 0.1 % (ref 0.0–7.0)
Eosinophils Absolute: 0 10*3/uL (ref 0.0–0.5)
HCT: 39.3 % (ref 38.4–49.9)
HEMOGLOBIN: 12.4 g/dL — AB (ref 13.0–17.1)
LYMPH%: 25.6 % (ref 14.0–49.0)
MCH: 29.8 pg (ref 27.2–33.4)
MCHC: 31.7 g/dL — ABNORMAL LOW (ref 32.0–36.0)
MCV: 94 fL (ref 79.3–98.0)
MONO#: 0.5 10*3/uL (ref 0.1–0.9)
MONO%: 8.1 % (ref 0.0–14.0)
NEUT#: 4 10*3/uL (ref 1.5–6.5)
NEUT%: 65.9 % (ref 39.0–75.0)
PLATELETS: 159 10*3/uL (ref 140–400)
RBC: 4.18 10*6/uL — ABNORMAL LOW (ref 4.20–5.82)
RDW: 15.9 % — AB (ref 11.0–14.6)
WBC: 6.1 10*3/uL (ref 4.0–10.3)
lymph#: 1.6 10*3/uL (ref 0.9–3.3)

## 2015-02-01 LAB — COMPREHENSIVE METABOLIC PANEL (CC13)
ALK PHOS: 74 U/L (ref 40–150)
ALT: 40 U/L (ref 0–55)
AST: 22 U/L (ref 5–34)
Albumin: 3.8 g/dL (ref 3.5–5.0)
Anion Gap: 11 mEq/L (ref 3–11)
BILIRUBIN TOTAL: 0.31 mg/dL (ref 0.20–1.20)
BUN: 55.5 mg/dL — AB (ref 7.0–26.0)
CO2: 12 mEq/L — ABNORMAL LOW (ref 22–29)
CREATININE: 1.5 mg/dL — AB (ref 0.7–1.3)
Calcium: 8.9 mg/dL (ref 8.4–10.4)
Chloride: 116 mEq/L — ABNORMAL HIGH (ref 98–109)
EGFR: 48 mL/min/{1.73_m2} — ABNORMAL LOW (ref >90)
Glucose: 182 mg/dl — ABNORMAL HIGH (ref 70–140)
Potassium: 6.1 mEq/L (ref 3.5–5.1)
Sodium: 139 mEq/L (ref 136–145)
Total Protein: 5.7 g/dL — ABNORMAL LOW (ref 6.4–8.3)

## 2015-02-01 LAB — MAGNESIUM (CC13): Magnesium: 2.1 mg/dl (ref 1.5–2.5)

## 2015-02-01 MED ORDER — ALTEPLASE 2 MG IJ SOLR
2.0000 mg | Freq: Once | INTRAMUSCULAR | Status: DC | PRN
  Filled 2015-02-01: qty 2

## 2015-02-01 MED ORDER — SODIUM CHLORIDE 0.9 % IV SOLN
1.0000 g | Freq: Once | INTRAVENOUS | Status: AC
  Administered 2015-02-01: 1 g via INTRAVENOUS
  Filled 2015-02-01: qty 2

## 2015-02-01 MED ORDER — SODIUM CHLORIDE 0.9 % IJ SOLN
10.0000 mL | INTRAMUSCULAR | Status: DC | PRN
  Administered 2015-02-01: 10 mL via INTRAVENOUS
  Filled 2015-02-01: qty 10

## 2015-02-01 MED ORDER — HEPARIN SOD (PORK) LOCK FLUSH 100 UNIT/ML IV SOLN
500.0000 [IU] | Freq: Once | INTRAVENOUS | Status: AC | PRN
  Administered 2015-02-01: 500 [IU]
  Filled 2015-02-01: qty 5

## 2015-02-01 MED ORDER — FUROSEMIDE 10 MG/ML IJ SOLN
10.0000 mg | Freq: Once | INTRAMUSCULAR | Status: AC
  Administered 2015-02-01: 10 mg via INTRAVENOUS

## 2015-02-01 MED ORDER — HEPARIN SOD (PORK) LOCK FLUSH 100 UNIT/ML IV SOLN
250.0000 [IU] | Freq: Once | INTRAVENOUS | Status: DC | PRN
  Filled 2015-02-01: qty 5

## 2015-02-01 NOTE — Patient Instructions (Signed)
Dehydration, Adult Dehydration is when you lose more fluids from the body than you take in. Vital organs like the kidneys, brain, and heart cannot function without a proper amount of fluids and salt. Any loss of fluids from the body can cause dehydration.  CAUSES   Vomiting.  Diarrhea.  Excessive sweating.  Excessive urine output.  Fever. SYMPTOMS  Mild dehydration  Thirst.  Dry lips.  Slightly dry mouth. Moderate dehydration  Very dry mouth.  Sunken eyes.  Skin does not bounce back quickly when lightly pinched and released.  Dark urine and decreased urine production.  Decreased tear production.  Headache. Severe dehydration  Very dry mouth.  Extreme thirst.  Rapid, weak pulse (more than 100 beats per minute at rest).  Cold hands and feet.  Not able to sweat in spite of heat and temperature.  Rapid breathing.  Blue lips.  Confusion and lethargy.  Difficulty being awakened.  Minimal urine production.  No tears. DIAGNOSIS  Your caregiver will diagnose dehydration based on your symptoms and your exam. Blood and urine tests will help confirm the diagnosis. The diagnostic evaluation should also identify the cause of dehydration. TREATMENT  Treatment of mild or moderate dehydration can often be done at home by increasing the amount of fluids that you drink. It is best to drink small amounts of fluid more often. Drinking too much at one time can make vomiting worse. Refer to the home care instructions below. Severe dehydration needs to be treated at the hospital where you will probably be given intravenous (IV) fluids that contain water and electrolytes. HOME CARE INSTRUCTIONS   Ask your caregiver about specific rehydration instructions.  Drink enough fluids to keep your urine clear or pale yellow.  Drink small amounts frequently if you have nausea and vomiting.  Eat as you normally do.  Avoid:  Foods or drinks high in sugar.  Carbonated  drinks.  Juice.  Extremely hot or cold fluids.  Drinks with caffeine.  Fatty, greasy foods.  Alcohol.  Tobacco.  Overeating.  Gelatin desserts.  Wash your hands well to avoid spreading bacteria and viruses.  Only take over-the-counter or prescription medicines for pain, discomfort, or fever as directed by your caregiver.  Ask your caregiver if you should continue all prescribed and over-the-counter medicines.  Keep all follow-up appointments with your caregiver. SEEK MEDICAL CARE IF:  You have abdominal pain and it increases or stays in one area (localizes).  You have a rash, stiff neck, or severe headache.  You are irritable, sleepy, or difficult to awaken.  You are weak, dizzy, or extremely thirsty. SEEK IMMEDIATE MEDICAL CARE IF:   You are unable to keep fluids down or you get worse despite treatment.  You have frequent episodes of vomiting or diarrhea.  You have blood or green matter (bile) in your vomit.  You have blood in your stool or your stool looks black and tarry.  You have not urinated in 6 to 8 hours, or you have only urinated a small amount of very dark urine.  You have a fever.  You faint. MAKE SURE YOU:   Understand these instructions.  Will watch your condition.  Will get help right away if you are not doing well or get worse. Document Released: 12/08/2005 Document Revised: 03/01/2012 Document Reviewed: 07/28/2011 ExitCare Patient Information 2015 [employer]. This information is not intended to replace advice given to you by your health care provider. Make sure you discuss any questions you have with your health care   provider.  

## 2015-02-04 ENCOUNTER — Other Ambulatory Visit: Payer: Self-pay | Admitting: Oncology

## 2015-02-04 NOTE — Progress Notes (Unsigned)
I am reviewing Timothy [name]'s medications to see what we are doing that may be causing his hyperkalemia. I note that his diltiazem is listed twice. I will make sure that he is only taking this drug once. He is on lisinopril, which is an ACE inhibitor, which can decrease aldosterone secretion, which can result in less potassium excretion. Possibly we can substitute a diuretic for the lisinopril. We will discuss that at his next visit here.

## 2015-02-08 ENCOUNTER — Other Ambulatory Visit: Payer: Self-pay | Admitting: *Deleted

## 2015-02-08 ENCOUNTER — Ambulatory Visit (HOSPITAL_BASED_OUTPATIENT_CLINIC_OR_DEPARTMENT_OTHER): Payer: BC Managed Care – PPO

## 2015-02-08 ENCOUNTER — Ambulatory Visit (HOSPITAL_BASED_OUTPATIENT_CLINIC_OR_DEPARTMENT_OTHER): Payer: BC Managed Care – PPO | Admitting: Oncology

## 2015-02-08 ENCOUNTER — Telehealth: Payer: Self-pay | Admitting: Oncology

## 2015-02-08 ENCOUNTER — Other Ambulatory Visit (HOSPITAL_BASED_OUTPATIENT_CLINIC_OR_DEPARTMENT_OTHER): Payer: BC Managed Care – PPO

## 2015-02-08 VITALS — BP 130/65 | HR 63 | Temp 97.6°F | Resp 18 | Ht 66.0 in | Wt 214.1 lb

## 2015-02-08 DIAGNOSIS — R197 Diarrhea, unspecified: Secondary | ICD-10-CM

## 2015-02-08 DIAGNOSIS — D849 Immunodeficiency, unspecified: Secondary | ICD-10-CM

## 2015-02-08 DIAGNOSIS — C9110 Chronic lymphocytic leukemia of B-cell type not having achieved remission: Secondary | ICD-10-CM

## 2015-02-08 DIAGNOSIS — Z9489 Other transplanted organ and tissue status: Secondary | ICD-10-CM

## 2015-02-08 DIAGNOSIS — T8609 Other complications of bone marrow transplant: Secondary | ICD-10-CM

## 2015-02-08 DIAGNOSIS — T380X5A Adverse effect of glucocorticoids and synthetic analogues, initial encounter: Secondary | ICD-10-CM

## 2015-02-08 DIAGNOSIS — D899 Disorder involving the immune mechanism, unspecified: Secondary | ICD-10-CM

## 2015-02-08 DIAGNOSIS — G8929 Other chronic pain: Secondary | ICD-10-CM

## 2015-02-08 DIAGNOSIS — E099 Drug or chemical induced diabetes mellitus without complications: Secondary | ICD-10-CM

## 2015-02-08 DIAGNOSIS — D89811 Chronic graft-versus-host disease: Secondary | ICD-10-CM

## 2015-02-08 DIAGNOSIS — M810 Age-related osteoporosis without current pathological fracture: Secondary | ICD-10-CM

## 2015-02-08 DIAGNOSIS — M549 Dorsalgia, unspecified: Principal | ICD-10-CM

## 2015-02-08 LAB — CBC WITH DIFFERENTIAL/PLATELET
BASO%: 0.3 % (ref 0.0–2.0)
Basophils Absolute: 0 10*3/uL (ref 0.0–0.1)
EOS ABS: 0 10*3/uL (ref 0.0–0.5)
EOS%: 0.1 % (ref 0.0–7.0)
HCT: 40.6 % (ref 38.4–49.9)
HGB: 12.8 g/dL — ABNORMAL LOW (ref 13.0–17.1)
LYMPH#: 0.9 10*3/uL (ref 0.9–3.3)
LYMPH%: 22.1 % (ref 14.0–49.0)
MCH: 29.8 pg (ref 27.2–33.4)
MCHC: 31.6 g/dL — ABNORMAL LOW (ref 32.0–36.0)
MCV: 94.4 fL (ref 79.3–98.0)
MONO#: 0.2 10*3/uL (ref 0.1–0.9)
MONO%: 4.8 % (ref 0.0–14.0)
NEUT%: 72.7 % (ref 39.0–75.0)
NEUTROS ABS: 3.1 10*3/uL (ref 1.5–6.5)
Platelets: 145 10*3/uL (ref 140–400)
RBC: 4.3 10*6/uL (ref 4.20–5.82)
RDW: 16.2 % — AB (ref 11.0–14.6)
WBC: 4.3 10*3/uL (ref 4.0–10.3)

## 2015-02-08 LAB — COMPREHENSIVE METABOLIC PANEL (CC13)
ALK PHOS: 97 U/L (ref 40–150)
ALT: 131 U/L — AB (ref 0–55)
AST: 36 U/L — AB (ref 5–34)
Albumin: 3.7 g/dL (ref 3.5–5.0)
Anion Gap: 7 mEq/L (ref 3–11)
BILIRUBIN TOTAL: 0.33 mg/dL (ref 0.20–1.20)
BUN: 49.5 mg/dL — ABNORMAL HIGH (ref 7.0–26.0)
CHLORIDE: 118 meq/L — AB (ref 98–109)
CO2: 13 mEq/L — ABNORMAL LOW (ref 22–29)
Calcium: 9.1 mg/dL (ref 8.4–10.4)
Creatinine: 1.5 mg/dL — ABNORMAL HIGH (ref 0.7–1.3)
EGFR: 49 mL/min/{1.73_m2} — AB (ref >90)
Glucose: 236 mg/dl — ABNORMAL HIGH (ref 70–140)
POTASSIUM: 5.7 meq/L — AB (ref 3.5–5.1)
Sodium: 139 mEq/L (ref 136–145)
TOTAL PROTEIN: 5.7 g/dL — AB (ref 6.4–8.3)

## 2015-02-08 LAB — MAGNESIUM (CC13): Magnesium: 2.3 mg/dl (ref 1.5–2.5)

## 2015-02-08 MED ORDER — SODIUM CHLORIDE 0.9 % IV SOLN
1.0000 g | Freq: Once | INTRAVENOUS | Status: AC
  Administered 2015-02-08: 1 g via INTRAVENOUS
  Filled 2015-02-08: qty 2

## 2015-02-08 MED ORDER — SODIUM CHLORIDE 0.9 % IJ SOLN
10.0000 mL | INTRAMUSCULAR | Status: DC | PRN
  Administered 2015-02-08: 10 mL via INTRAVENOUS
  Filled 2015-02-08: qty 10

## 2015-02-08 MED ORDER — HEPARIN SOD (PORK) LOCK FLUSH 100 UNIT/ML IV SOLN
250.0000 [IU] | Freq: Once | INTRAVENOUS | Status: AC | PRN
  Administered 2015-02-08: 250 [IU]
  Filled 2015-02-08: qty 5

## 2015-02-08 NOTE — Addendum Note (Signed)
Addended by: Laureen Abrahams on: 02/08/2015 02:00 PM   Modules accepted: Orders, Medications

## 2015-02-08 NOTE — Telephone Encounter (Signed)
per pof to sch pt appt-CS & IR will call & scg appt with pt

## 2015-02-08 NOTE — Patient Instructions (Signed)
Dehydration, Adult Dehydration is when you lose more fluids from the body than you take in. Vital organs like the kidneys, brain, and heart cannot function without a proper amount of fluids and salt. Any loss of fluids from the body can cause dehydration.  CAUSES   Vomiting.  Diarrhea.  Excessive sweating.  Excessive urine output.  Fever. SYMPTOMS  Mild dehydration  Thirst.  Dry lips.  Slightly dry mouth. Moderate dehydration  Very dry mouth.  Sunken eyes.  Skin does not bounce back quickly when lightly pinched and released.  Dark urine and decreased urine production.  Decreased tear production.  Headache. Severe dehydration  Very dry mouth.  Extreme thirst.  Rapid, weak pulse (more than 100 beats per minute at rest).  Cold hands and feet.  Not able to sweat in spite of heat and temperature.  Rapid breathing.  Blue lips.  Confusion and lethargy.  Difficulty being awakened.  Minimal urine production.  No tears. DIAGNOSIS  Your caregiver will diagnose dehydration based on your symptoms and your exam. Blood and urine tests will help confirm the diagnosis. The diagnostic evaluation should also identify the cause of dehydration. TREATMENT  Treatment of mild or moderate dehydration can often be done at home by increasing the amount of fluids that you drink. It is best to drink small amounts of fluid more often. Drinking too much at one time can make vomiting worse. Refer to the home care instructions below. Severe dehydration needs to be treated at the hospital where you will probably be given intravenous (IV) fluids that contain water and electrolytes. HOME CARE INSTRUCTIONS   Ask your caregiver about specific rehydration instructions.  Drink enough fluids to keep your urine clear or pale yellow.  Drink small amounts frequently if you have nausea and vomiting.  Eat as you normally do.  Avoid:  Foods or drinks high in sugar.  Carbonated  drinks.  Juice.  Extremely hot or cold fluids.  Drinks with caffeine.  Fatty, greasy foods.  Alcohol.  Tobacco.  Overeating.  Gelatin desserts.  Wash your hands well to avoid spreading bacteria and viruses.  Only take over-the-counter or prescription medicines for pain, discomfort, or fever as directed by your caregiver.  Ask your caregiver if you should continue all prescribed and over-the-counter medicines.  Keep all follow-up appointments with your caregiver. SEEK MEDICAL CARE IF:  You have abdominal pain and it increases or stays in one area (localizes).  You have a rash, stiff neck, or severe headache.  You are irritable, sleepy, or difficult to awaken.  You are weak, dizzy, or extremely thirsty. SEEK IMMEDIATE MEDICAL CARE IF:   You are unable to keep fluids down or you get worse despite treatment.  You have frequent episodes of vomiting or diarrhea.  You have blood or green matter (bile) in your vomit.  You have blood in your stool or your stool looks black and tarry.  You have not urinated in 6 to 8 hours, or you have only urinated a small amount of very dark urine.  You have a fever.  You faint. MAKE SURE YOU:   Understand these instructions.  Will watch your condition.  Will get help right away if you are not doing well or get worse. Document Released: 12/08/2005 Document Revised: 03/01/2012 Document Reviewed: 07/28/2011 ExitCare Patient Information 2015 [employer]. This information is not intended to replace advice given to you by your health care provider. Make sure you discuss any questions you have with your health care   provider.  

## 2015-02-08 NOTE — Progress Notes (Signed)
ID: Timothy [name]   DOB: [birth year]  MR#: [id number]  YEB#:[id number]  OHF:GBMS, [name], MD SU: OTHER MD: [name]; [name], MD; [name], [name],MD; [name], MD; [facility] in [place], [place]:  [name], RN [phone]), [name] M.D.  CHIEF COMPLAINT:  CLL, status post allogeneic stem cell transplant, GVHD CURRENT TREATMENT: Immunosuppression  HISTORY OF CLL: From the original intake note:  We have very complete records from Dr. Racheal Patches in [place], and in summary:  The patient was initially diagnosed in August 2000, with a white cell count of 23,600, but normal hemoglobin and platelets, and no significant symptomatology. Over the next several years his white cell count drifted up, and he eventually developed some symptoms of night sweats in particular, leading to treatment with fludarabine, Cytoxan and rituxan for five cycles given between December 2006 and May 2007.  We have CT scans from June 2006, November 2006 and April 2007, and comparing the November 2006 and April 2007 scans, there was near complete response. He had subsequent therapy in [place] as detailed below, but with decreased response, leading to allogeneic stem-cell transplant at the [facility] 02/24/2012.  Subsequent history is as detailed below.  INTERVAL HISTORY: Timothy [name] returns today for follow up of his chronic lymphoid leukemia and graft-versus-host disease accompanied his wife, [name]. He was having increasing problems with diarrhea so we upped his prednisone. This did help the diarrhea and yesterday he had one large bowel movement at 10 in the morning and he has not had any since. He is using the Questran little bit more frequently. He understands he can use it up to 3 times a day. On the other hand his blood sugars have gone up and his insulin has been increased to 24 mg daily on the Levemir and then 8 mg with meals on the  low NovoLog.  REVIEW OF SYSTEMS: Timothy [name] is taking about 2500 steps a day according to his appetite. He remains very active intellectually. There have not been any fevers, he is not aware of any mouth sores or difficulty swallowing or pain on swallowing, he has not noted any rash certainly no adenopathy, and no unusual headaches, visual changes, nausea, vomiting, dizziness, or gait imbalance. There have been no intercurrent falls. A detailed review of systems today was otherwise stable.  PAST MEDICAL HISTORY: Past Medical History  Diagnosis Date  . 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 Diverticular disease   . Hyperlipidemia   . Obesity   . Hypertension   . Hiatal hernia   . CMV (cytomegalovirus) antibody positive     pre-transplant, with seroconversion x2 pst-transplant  . Right bundle branch block     pre-transplant  . CKD (chronic kidney disease) stage 2, GFR 60-89 ml/min   . Pancytopenia   . Steroid-induced diabetes   . Atrial fibrillation     post-transplant  . Myopathy   . 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 Leukemia, chronic lymphoid   . 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 Diarrhea in adult patient 12/05/2012    Due to active GVHD  . CLL (chronic lymphocytic leukemia) 12/05/2012    Dx 07/1999; started Rx 12/06  AlloBMT 3/13  . Rash of face 12/05/2012    Due to GVHD  . Hypomagnesemia 01/26/2013  . Left hip pain 12/01/2013    PAST SURGICAL HISTORY: Past Surgical  History  Procedure Laterality Date  . Tonsillectomy and adenoidectomy    . Bone marrow transplant    . Flexible sigmoidoscopy  11/17/2012    Procedure: FLEXIBLE SIGMOIDOSCOPY;  Surgeon: Jeryl Columbia, MD;  Location: [facility];  Service: Endoscopy;  Laterality: N/A;  Dr Watt Climes states will be admitted to rooom 1339 11/16/12  . Esophagogastroduodenoscopy  11/17/2012    Procedure:  ESOPHAGOGASTRODUODENOSCOPY (EGD);  Surgeon: Jeryl Columbia, MD;  Location: [facility];  Service: Endoscopy;  Laterality: N/A;    FAMILY HISTORY Family History  Problem Relation Age of Onset  . Cancer Father   The patient's father died from complications of chronic lymphocytic leukemia at the age of 38.  It had been diagnosed seven years before when he was 20.  The patient is enrolled in a familial chronic lymphocytic leukemia study out of the [employer].  The patient's mother is 66, alive, unfortunately suffering with dementia, and he has a brother, 68, who is otherwise in fair health.   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  His wife of >40 years, [name], is a homemaker.  Their daughter, [name], lives in [place].  She also is a Agricultural engineer.  The patient has an 55 year old grandson and an 34-year-old granddaughter, and that is really the main reason he moved to this area.  He is a Tourist information centre manager.     ADVANCED DIRECTIVES: In place  HEALTH MAINTENANCE: (Updated 04/13/2014) History  Substance Use Topics  . Smoking status: Never Smoker   . Smokeless tobacco: Never Used  . Alcohol Use: No     Colonoscopy: Nov 2013, Dr. Watt Climes  PSA: Not on file  Bone density:  Feb 2014;  Patient also has known insufficiency and pathologic fractures in addition to his long-standing history of steroid use.  Lipid panel: Jan 2015, elevated    Allergies  Allergen Reactions  . Benadryl [Diphenhydramine Hcl]     "Restless leg syndrome"    Current Outpatient Prescriptions  Medication Sig Dispense Refill  . acyclovir (ZOVIRAX) 400 MG tablet TAKE 2 TABLETS BY MOUTH TWICE DAILY 120 tablet 1  . budesonide (ENTOCORT EC) 3 MG 24 hr capsule Take 1 capsule (3 mg total) by mouth 3  (three) times daily. 90 capsule 6  . CARTIA XT 240 MG 24 hr capsule TAKE 1 CAPSULE BY MOUTH ONCE DAILY 30 capsule 3  . cholestyramine (QUESTRAN) 4 G packet Take 1 packet (4 g total) by mouth 2 (two) times daily. 60 each 12  . diltiazem (DILACOR XR) 240 MG 24 hr capsule Take 1 capsule (240 mg total) by mouth every evening. 30 capsule 5  . fluconazole (DIFLUCAN) 100 MG tablet Take 1 tablet (100 mg total) by mouth once. 30 tablet 3  . furosemide (LASIX) 20 MG tablet Take 1 tablet (20 mg total) by mouth daily. 30 tablet 1  . glucose blood (ONE TOUCH ULTRA TEST) test strip Test before meals and at bedtime. 300 each 2  . insulin aspart (NOVOLOG) 100 unit/mL injection Inject 8 Units into the skin 3 (three) times daily before meals.    . Insulin Detemir (LEVEMIR) 100 UNIT/ML Pen Inject 24 Units into the skin daily at 10 pm.    . Insulin Pen Needle (B-D UF III MINI PEN NEEDLES) 31G X 5 MM MISC  Use twice daily with insulin as directed 100 each 5  . labetalol (NORMODYNE) 200 MG tablet Take 2 tablets (400 mg total) by mouth 2 (two) times daily. 120 tablet 1  . Lidocaine-Hydrocortisone Ace 3-0.5 % KIT Apply 1 application topically as needed (for pain). 1 each 3  . lidocaine-prilocaine (EMLA) cream Apply 1 application topically as needed. 1-2 hrs before each port access 30 g 6  . lisinopril (PRINIVIL,ZESTRIL) 10 MG tablet Take 10 mg by mouth every evening.     . loratadine (CLARITIN) 10 MG tablet Take 10 mg by mouth daily.    . Multiple Vitamin (MULTIVITAMIN WITH MINERALS) TABS tablet Take 1 tablet by mouth daily.    Marland Kitchen omeprazole (PRILOSEC) 20 MG capsule Take 1 capsule (20 mg total) by mouth daily. 30 capsule 5  . ONETOUCH DELICA LANCETS 00X MISC TEST BEFORE MEALS AND AT BEDTIME. 300 each 0  . predniSONE (DELTASONE) 10 MG tablet Take 1 tablet (10 mg total) by mouth daily with breakfast. Take $RemoveBeforeD'20mg'VMahuqgcnUwyBx$  in the morning and 10 mg at night x 1 week, then reduce to $RemoveB'20mg'JKOrfrxt$  daily x 2 weeks 30 tablet 1  . sertraline (ZOLOFT)  50 MG tablet ALTERNATE TAKING 1 TABLET DAILY AND 2 TABLETS DAILY 90 tablet 5  . sulfamethoxazole-trimethoprim (BACTRIM DS,SEPTRA DS) 800-160 MG per tablet Take 1 tablet by mouth daily. 30 tablet 5  . tacrolimus (PROGRAF) 0.5 MG capsule Take 3 capsules (1.5 mg total) by mouth 2 (two) times daily. 180 capsule 4  . VOLTAREN 1 % GEL Apply 2 g topically 2 (two) times daily. Applied to back    . [DISCONTINUED] insulin aspart (NOVOLOG FLEXPEN) 100 UNIT/ML SOPN FlexPen 18units sq qam, 9units sq qpm, or as directed 15 mL 1   No current facility-administered medications for this visit.   Facility-Administered Medications Ordered in Other Visits  Medication Dose Route Frequency Provider Last Rate Last Dose  . 0.9 %  sodium chloride infusion   Intravenous Continuous Amy G Berry, PA-C 500 mL/hr at 03/12/13 0900    . sodium chloride 0.9 % injection 10 mL  10 mL Intravenous PRN Chauncey Cruel, MD   10 mL at 08/11/12 1606    OBJECTIVE: Middle-aged white man in no acute distress Filed Vitals:   02/08/15 0957  BP: 130/65  Pulse: 63  Temp: 97.6 F (36.4 C)  Resp: 18  Body mass index is 34.57 kg/(m^2).  ECOG: 2 Filed Weights   02/08/15 0957  Weight: 214 lb 1.6 oz (97.115 kg)   Sclerae unicteric, pupils equal and reactive Oropharynx clear and moist-- no thrush or other lesions  No cervical or supraclavicular adenopathy Lungs no rales or rhonchi Heart regular rate and rhythm Abd soft, obese, nontender, positive bowel sounds MSK no focal spinal tenderness, no joint edema Neuro: nonfocal, well oriented, appropriate affect Skin: Multiple ecchymosis but no rash suggestive of graft-versus-host disease activity   LABS:  CBC    Component Value Date/Time   WBC 4.3 02/08/2015 0919   WBC 7.1 04/06/2014 0949   RBC 4.30 02/08/2015 0919   RBC 4.06* 04/06/2014 0949   RBC 3.82* 03/16/2013 1400   HGB 12.8* 02/08/2015 0919   HGB 12.2* 04/06/2014 0949   HCT 40.6 02/08/2015 0919   HCT 38.5* 04/06/2014  0949   PLT 145 02/08/2015 0919   PLT 144* 04/06/2014 0949   MCV 94.4 02/08/2015 0919   MCV 94.8 04/06/2014 0949   MCH 29.8 02/08/2015 0919   MCH 30.0 04/06/2014 0949   MCHC  31.6* 02/08/2015 0919   MCHC 31.7 04/06/2014 0949   RDW 16.2* 02/08/2015 0919   RDW 15.7* 04/06/2014 0949   LYMPHSABS 0.9 02/08/2015 0919   LYMPHSABS 1.4 03/18/2013 0615   MONOABS 0.2 02/08/2015 0919   MONOABS 0.3 03/18/2013 0615   EOSABS 0.0 02/08/2015 0919   EOSABS 0.0 03/18/2013 0615   BASOSABS 0.0 02/08/2015 0919   BASOSABS 0.0 03/18/2013 0615       Chemistry      Component Value Date/Time   NA 139 02/08/2015 0919   NA 139 09/07/2014 1324   K 5.7* 02/08/2015 0919   K 4.8 09/07/2014 1324   CL 110 09/07/2014 1324   CL 101 06/15/2013 1034   CO2 13* 02/08/2015 0919   CO2 15* 09/07/2014 1324   BUN 49.5* 02/08/2015 0919   BUN 46* 09/07/2014 1324   CREATININE 1.5* 02/08/2015 0919   CREATININE 1.20 09/07/2014 1324      Component Value Date/Time   CALCIUM 9.1 02/08/2015 0919   CALCIUM 9.1 09/07/2014 1324   ALKPHOS 97 02/08/2015 0919   ALKPHOS 80 09/07/2014 1324   AST 36* 02/08/2015 0919   AST 40* 09/07/2014 1324   ALT 131* 02/08/2015 0919   ALT 55* 09/07/2014 1324   BILITOT 0.33 02/08/2015 0919   BILITOT <0.2* 09/07/2014 1324     STUDIES: No results found.   ASSESSMENT: 69 y.o. [place] man with a history of well-differentiated lymphocytic lymphoma/ chronic lymphoid leukemia initially diagnosed in 2000, not requiring intervention until 2006; with multiple chromosomal abnormalities.  His treatment history is as follows:  (1) fludarabine/cyclophosphamide/rituximab x5 completed May 2007.   (2) rituximab for 8 doses October 2010, with partial response   (3) Leustatin and ofatumumab weekly x8 July to September 2011 followed by maintenance ofatumumab  every 2 months, with initial response but rising counts September 2012   (4) status-post unrelated donor stem-cell transplant 02/24/2012 at the  [facility]  (a) conditioning regimen consisted of fludarabine + TBI at 200 cGy, followed by rituximab x27;  (b) CMV reactivation x3 (patient CMV positive, donor negative), s/p ganciclovir treatment; 3d reactivation August 2013, s/p gancyclovir, with negative PCR mid-September 2013; last gancyclovir dose 10/06/2012 (c) Chronic GVHD: involving gut and skin, treated with steroids, tacrolimus and MMF.  MMF was eventually d/c'd and tacrolimus currently at a dose of 1.$RemoveB'5mg'StquoGjc$  BID (d) atrial fibrillation: resolved on brief amiodarone regimen (e) steroid-induced myopathy: improving  (f) hypomagnesemia: improved after d/c gancyclovir, needs continuing support (g) hypogammaglobulinemia: requiring IVIG most recently 08/03/2014. (h) history of elevated triglycerides (606 on 07/14/2012)  (i) adrenal insufficiency: on prednisone and budesonide (j) pancytopenia,resolved (k) brief episode of neutropenia ([place] 300) February 2015, accompanied by diarrhea; resolved   (5) restaging studies February-March 2015 including CT scans, flow cytometry, and bone marrow biopsy, showed no evidence of residual chronic lymphoid leukemia.  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 Status post kyphoplasty at L3-4 in June 2014. Also with evidence of rib fractures and insufficiency fractures bilaterally of the sacral  alae, noted by CT in March 2015. --   Denosumab started 12/08/2013,  given as prolia Q6 months  which is what has been approved by his insurance, most recent dose 10/02/2014  (12) chronic back pain and hip pain controlled with OxyContin and hydrocodone/APAP.  (12) nausea: well controlled on current meds  (13)  Positive c.diff, 03/08/2013, on Flagyl 500 mg TID x 20 days, then on oral vanco with Questran, showing improvement; positive when repeated April 2014; Negative x 3 since then  (14) persistently increased BUN and potassium  (15)  Hypertension, on labetalol, cardizem, lisinopril, and furosemide; managed by Dr. Brigitte Pulse  (16) steroid induced hyperglycemia, on sterlix and 70/30 insulin, followed by Dr. Loanne Drilling and Dr. Brigitte Pulse  (17) hypogammaglobulinemia-- requiring intermittent supplementation, most recent dose 10/19/2014  (18) squamous cell CA in situ removed from left parietal scalp October 2014, second lesion to be removed may 2015   PLAN: Joey's diarrhea seems to be improving on the higher dose of prednisone. Unfortunately that has sent his sugars our of range and Dr Brigitte Pulse is adjusting his insulin accordingly. I think we can drop the prednisone to 15 mg/ day starting tomorrow. He can also up the questran to TID. Finally, he received a note from [facility] re colonoscopy and I think it would be a good idea to go ahead and have that done to make sure what we are continuing to deal with is indeed a graft-versus-host and not some other problem.  I have asked him to drop his lisinopril at 25 mg a day, as this may be contributing to his hyper K [place]. I am not increasing his Lasix because he already has the diarrhea problem and I do not want him to become dehydrated.  We have received a request from the [facility] at to completely restage [place] with CT scans of the chest abdomen and pelvis and bone marrow biopsy with flow cytometry and cytogenetics. We will try to obtain that within the next 2 weeks.  Otherwise I am delighted that he is just about at his 3 year anniversary  with no evidence of disease recurrence. I really think he is done with his chronic lymphoid leukemia. He probably has a higher risk of developing a myelodysplasia from the intensive treatment he received then of the leukemia recurring.  He knows to call for any problems that may develop before his next visit here. Chauncey Cruel, MD 02/08/2015

## 2015-02-09 NOTE — Telephone Encounter (Signed)
none

## 2015-02-15 ENCOUNTER — Telehealth: Payer: Self-pay | Admitting: Oncology

## 2015-02-15 ENCOUNTER — Other Ambulatory Visit (HOSPITAL_BASED_OUTPATIENT_CLINIC_OR_DEPARTMENT_OTHER): Payer: BC Managed Care – PPO

## 2015-02-15 ENCOUNTER — Ambulatory Visit (HOSPITAL_COMMUNITY)
Admission: RE | Admit: 2015-02-15 | Discharge: 2015-02-15 | Disposition: A | Discharge status: Home / Self Care | Payer: BC Managed Care – PPO | Source: Ambulatory Visit | Attending: Nurse Practitioner | Admitting: Nurse Practitioner

## 2015-02-15 ENCOUNTER — Ambulatory Visit (HOSPITAL_BASED_OUTPATIENT_CLINIC_OR_DEPARTMENT_OTHER): Payer: BC Managed Care – PPO | Admitting: Nurse Practitioner

## 2015-02-15 ENCOUNTER — Other Ambulatory Visit: Payer: Self-pay | Admitting: *Deleted

## 2015-02-15 ENCOUNTER — Encounter: Payer: Self-pay | Admitting: Nurse Practitioner

## 2015-02-15 ENCOUNTER — Ambulatory Visit (HOSPITAL_BASED_OUTPATIENT_CLINIC_OR_DEPARTMENT_OTHER): Payer: BC Managed Care – PPO

## 2015-02-15 DIAGNOSIS — C9110 Chronic lymphocytic leukemia of B-cell type not having achieved remission: Secondary | ICD-10-CM

## 2015-02-15 DIAGNOSIS — W19XXXA Unspecified fall, initial encounter: Secondary | ICD-10-CM | POA: Insufficient documentation

## 2015-02-15 DIAGNOSIS — M546 Pain in thoracic spine: Secondary | ICD-10-CM | POA: Insufficient documentation

## 2015-02-15 DIAGNOSIS — M545 Low back pain: Secondary | ICD-10-CM | POA: Diagnosis not present

## 2015-02-15 DIAGNOSIS — R197 Diarrhea, unspecified: Secondary | ICD-10-CM

## 2015-02-15 DIAGNOSIS — E875 Hyperkalemia: Secondary | ICD-10-CM

## 2015-02-15 DIAGNOSIS — N189 Chronic kidney disease, unspecified: Secondary | ICD-10-CM

## 2015-02-15 DIAGNOSIS — N289 Disorder of kidney and ureter, unspecified: Secondary | ICD-10-CM

## 2015-02-15 DIAGNOSIS — R531 Weakness: Secondary | ICD-10-CM | POA: Insufficient documentation

## 2015-02-15 LAB — COMPREHENSIVE METABOLIC PANEL (CC13)
ALBUMIN: 3.6 g/dL (ref 3.5–5.0)
ALT: 53 U/L (ref 0–55)
AST: 26 U/L (ref 5–34)
Alkaline Phosphatase: 67 U/L (ref 40–150)
Anion Gap: 5 mEq/L (ref 3–11)
BUN: 59.6 mg/dL — AB (ref 7.0–26.0)
CALCIUM: 9.4 mg/dL (ref 8.4–10.4)
CHLORIDE: 116 meq/L — AB (ref 98–109)
CO2: 16 meq/L — AB (ref 22–29)
Creatinine: 1.6 mg/dL — ABNORMAL HIGH (ref 0.7–1.3)
EGFR: 45 mL/min/{1.73_m2} — ABNORMAL LOW (ref >90)
Glucose: 185 mg/dl — ABNORMAL HIGH (ref 70–140)
Potassium: 6.6 mEq/L (ref 3.5–5.1)
Sodium: 137 mEq/L (ref 136–145)
Total Bilirubin: 0.38 mg/dL (ref 0.20–1.20)
Total Protein: 5.7 g/dL — ABNORMAL LOW (ref 6.4–8.3)

## 2015-02-15 LAB — CBC WITH DIFFERENTIAL/PLATELET
BASO%: 0.2 % (ref 0.0–2.0)
Basophils Absolute: 0 10*3/uL (ref 0.0–0.1)
EOS ABS: 0 10*3/uL (ref 0.0–0.5)
EOS%: 0.4 % (ref 0.0–7.0)
HEMATOCRIT: 41 % (ref 38.4–49.9)
HEMOGLOBIN: 12.9 g/dL — AB (ref 13.0–17.1)
LYMPH%: 27.7 % (ref 14.0–49.0)
MCH: 29.8 pg (ref 27.2–33.4)
MCHC: 31.4 g/dL — AB (ref 32.0–36.0)
MCV: 94.7 fL (ref 79.3–98.0)
MONO#: 0.4 10*3/uL (ref 0.1–0.9)
MONO%: 10.3 % (ref 0.0–14.0)
NEUT#: 2.6 10*3/uL (ref 1.5–6.5)
NEUT%: 61.4 % (ref 39.0–75.0)
Platelets: 129 10*3/uL — ABNORMAL LOW (ref 140–400)
RBC: 4.33 10*6/uL (ref 4.20–5.82)
RDW: 16.3 % — ABNORMAL HIGH (ref 11.0–14.6)
WBC: 4.3 10*3/uL (ref 4.0–10.3)
lymph#: 1.2 10*3/uL (ref 0.9–3.3)

## 2015-02-15 LAB — MAGNESIUM (CC13): Magnesium: 1.8 mg/dl (ref 1.5–2.5)

## 2015-02-15 MED ORDER — SODIUM CHLORIDE 0.9 % IJ SOLN
10.0000 mL | Freq: Once | INTRAMUSCULAR | Status: DC
  Filled 2015-02-15: qty 10

## 2015-02-15 MED ORDER — SODIUM CHLORIDE 0.9 % IV SOLN
1.0000 g | Freq: Once | INTRAVENOUS | Status: AC
  Administered 2015-02-15: 1 g via INTRAVENOUS
  Filled 2015-02-15: qty 2

## 2015-02-15 MED ORDER — FUROSEMIDE 10 MG/ML IJ SOLN
20.0000 mg | Freq: Once | INTRAMUSCULAR | Status: AC
  Administered 2015-02-15: 20 mg via INTRAVENOUS

## 2015-02-15 MED ORDER — FUROSEMIDE 10 MG/ML IJ SOLN
INTRAMUSCULAR | Status: AC
  Filled 2015-02-15: qty 2

## 2015-02-15 MED ORDER — HEPARIN SOD (PORK) LOCK FLUSH 100 UNIT/ML IV SOLN
500.0000 [IU] | Freq: Once | INTRAVENOUS | Status: DC | PRN
  Filled 2015-02-15: qty 5

## 2015-02-15 MED ORDER — SODIUM CHLORIDE 0.9 % IV SOLN: Freq: Once | INTRAVENOUS | Status: DC

## 2015-02-15 NOTE — Telephone Encounter (Signed)
s.w. pt and advised on 2.26 appt....pt ok and aware °

## 2015-02-15 NOTE — Patient Instructions (Signed)
Dehydration, Adult Dehydration is when you lose more fluids from the body than you take in. Vital organs like the kidneys, brain, and heart cannot function without a proper amount of fluids and salt. Any loss of fluids from the body can cause dehydration.  CAUSES   Vomiting.  Diarrhea.  Excessive sweating.  Excessive urine output.  Fever. SYMPTOMS  Mild dehydration  Thirst.  Dry lips.  Slightly dry mouth. Moderate dehydration  Very dry mouth.  Sunken eyes.  Skin does not bounce back quickly when lightly pinched and released.  Dark urine and decreased urine production.  Decreased tear production.  Headache. Severe dehydration  Very dry mouth.  Extreme thirst.  Rapid, weak pulse (more than 100 beats per minute at rest).  Cold hands and feet.  Not able to sweat in spite of heat and temperature.  Rapid breathing.  Blue lips.  Confusion and lethargy.  Difficulty being awakened.  Minimal urine production.  No tears. DIAGNOSIS  Your caregiver will diagnose dehydration based on your symptoms and your exam. Blood and urine tests will help confirm the diagnosis. The diagnostic evaluation should also identify the cause of dehydration. TREATMENT  Treatment of mild or moderate dehydration can often be done at home by increasing the amount of fluids that you drink. It is best to drink small amounts of fluid more often. Drinking too much at one time can make vomiting worse. Refer to the home care instructions below. Severe dehydration needs to be treated at the hospital where you will probably be given intravenous (IV) fluids that contain water and electrolytes. HOME CARE INSTRUCTIONS   Ask your caregiver about specific rehydration instructions.  Drink enough fluids to keep your urine clear or pale yellow.  Drink small amounts frequently if you have nausea and vomiting.  Eat as you normally do.  Avoid:  Foods or drinks high in sugar.  Carbonated  drinks.  Juice.  Extremely hot or cold fluids.  Drinks with caffeine.  Fatty, greasy foods.  Alcohol.  Tobacco.  Overeating.  Gelatin desserts.  Wash your hands well to avoid spreading bacteria and viruses.  Only take over-the-counter or prescription medicines for pain, discomfort, or fever as directed by your caregiver.  Ask your caregiver if you should continue all prescribed and over-the-counter medicines.  Keep all follow-up appointments with your caregiver. SEEK MEDICAL CARE IF:  You have abdominal pain and it increases or stays in one area (localizes).  You have a rash, stiff neck, or severe headache.  You are irritable, sleepy, or difficult to awaken.  You are weak, dizzy, or extremely thirsty. SEEK IMMEDIATE MEDICAL CARE IF:   You are unable to keep fluids down or you get worse despite treatment.  You have frequent episodes of vomiting or diarrhea.  You have blood or green matter (bile) in your vomit.  You have blood in your stool or your stool looks black and tarry.  You have not urinated in 6 to 8 hours, or you have only urinated a small amount of very dark urine.  You have a fever.  You faint. MAKE SURE YOU:   Understand these instructions.  Will watch your condition.  Will get help right away if you are not doing well or get worse. Document Released: 12/08/2005 Document Revised: 03/01/2012 Document Reviewed: 07/28/2011 ExitCare Patient Information 2015 [employer]. This information is not intended to replace advice given to you by your health care provider. Make sure you discuss any questions you have with your health care   provider.  

## 2015-02-15 NOTE — Telephone Encounter (Signed)
pt needed lab sch per pof

## 2015-02-15 NOTE — Assessment & Plan Note (Signed)
Patient suffers with chronic renal insufficiency.  Creatinine today was 1.6.  Will continue to monitor closely.

## 2015-02-15 NOTE — Assessment & Plan Note (Signed)
Patient with chronic hyperkalemia.  Potassium today is elevated to 6.6.  Patient has been instructed to decrease his lisinopril from 25 mg down to 10 mg every day.  Patient was also given Lasix 10 mg IV today in hopes of decreasing potassium level.  Patient is scheduled to return for repeat labs tomorrow afternoon; and for a follow-up visit with Dr. Jana Hakim.

## 2015-02-15 NOTE — Assessment & Plan Note (Signed)
Patient is complaining of progressive weakness; most likely secondary to recent increased prednisone use.  Patient has had multiple falls recently; with the last fall just yesterday.  Patient states that he became weak and buckled at his knees while trying to angulate around his house.  He fell; but denies hitting his head or any loss of consciousness.  He states that he was too weak to get up by himself; required the help of 2 family members to get up off the floor.  Hopefully the tapering of the prednisone will help with patient's progressive weakness issues. Marland Kitchen

## 2015-02-15 NOTE — Assessment & Plan Note (Signed)
Patient does also suffer with chronic hypomagnesemia.  Magnesium today was 1.8.  Patient received 1 g magnesium IV today.

## 2015-02-15 NOTE — Assessment & Plan Note (Signed)
Patient is complaining of progressive weakness; most likely secondary to recent increased prednisone use.  Patient has had multiple falls recently; with the last fall just yesterday.  Patient states that he became weak and buckled at his knees while trying to angulate around his house.  He fell; but denies hitting his head or any loss of consciousness.  He states that he was too weak to get up by himself; required the help of 2 family members to get up off the floor.  On exam-patient with extensive bruising to bilateral upper extremities; but denies any tenderness or decreased range of motion.  He is complaining of some mid to lower back discomfort since the fall.  Patient has a history of prior back surgery; and does also suffer with some mild chronic back pain as well.  Patient has Vicodin to take at home on an as-needed basis; but states that he rarely takes it.  Patient did obtain both the thoracic and lumbar plain film; which revealed no acute findings; no new compression fractures.  These results were reviewed with the patient this afternoon.

## 2015-02-15 NOTE — Progress Notes (Signed)
SYMPTOM MANAGEMENT CLINIC   HPI: Timothy [name] 69 y.o. male diagnosed with chronic lymphocytic leukemia.  Patient is status post bone marrow transplant in July 2013.  Currently undergoing observation.  Patient presented to the cancer Center today to receive his magnesium infusion for chronic hypomagnesemia.  Magnesium today was 1.8; and patient received 1 g magnesium IV today.  Patient has a history of chronic diarrhea; most likely secondary to chronic GVHD issues.  He has taken Imodium and Lomotil in the past.  He typically takes Questran on intermittent basis as well.  He initiated a tapering dose of prednisone recently; it states that this has greatly helped with his diarrhea.  However, patient is also noted a progression of his generalized weakness; has had multiple falls recently.  He states that he did fall just a few days ago; when he tried to ambulate and became two-week to stand.  He fell to the floor; but denies hitting his head or any loss of consciousness.  He states he is too weak to get back up off the floor himself; and required 2 family members to pick him up.  Patient is complaining of some mid to lower back pain since the fall.  He has a history of mild chronic back pain; it has undergone back surgery in the past.  He takes Vicodin on an as-needed basis.  Also, patient has been suffering with chronic hyperkalemia; despite decreasing his lisinopril from 25 mg down to 10 mg per day.  Patient denies any recent fevers or chills.     HPI  ROS  Past Medical History  Diagnosis Date  . 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 Diverticular disease   . Hyperlipidemia   . Obesity   . Hypertension   . Hiatal hernia   . CMV (cytomegalovirus) antibody positive     pre-transplant, with seroconversion x2 pst-transplant  . Right bundle branch block     pre-transplant  . CKD (chronic kidney disease) stage 2, GFR 60-89 ml/min   . Pancytopenia    . Steroid-induced diabetes   . Atrial fibrillation     post-transplant  . Myopathy   . 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 Leukemia, chronic lymphoid   . 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 Diarrhea in adult patient 12/05/2012    Due to active GVHD  . CLL (chronic lymphocytic leukemia) 12/05/2012    Dx 07/1999; started Rx 12/06  AlloBMT 3/13  . Rash of face 12/05/2012    Due to GVHD  . Hypomagnesemia 01/26/2013  . Left hip pain 12/01/2013    Past Surgical History  Procedure Laterality Date  . Tonsillectomy and adenoidectomy    . Bone marrow transplant    . Flexible sigmoidoscopy  11/17/2012    Procedure: FLEXIBLE SIGMOIDOSCOPY;  Surgeon: Petra Kuba, MD;  Location: [facility];  Service: Endoscopy;  Laterality: N/A;  Dr Ewing Schlein states will be admitted to rooom 1339 11/16/12  . Esophagogastroduodenoscopy  11/17/2012    Procedure: ESOPHAGOGASTRODUODENOSCOPY (EGD);  Surgeon: Petra Kuba, MD;  Location: [facility];  Service: Endoscopy;  Laterality: N/A;    has Transplant recipient; Diverticular disease; Hyperlipidemia; Obesity; Hypertension; Hiatal hernia; CMV (cytomegalovirus) antibody positive; Right bundle branch block; CKD (chronic kidney disease) stage 2, GFR 60-89 ml/min; Atrial fibrillation; Myopathy; Fine tremor; Immunocompromised; Chronic GVHD complicating bone marrow transplantation; CLL (chronic lymphocytic leukemia); Physical deconditioning; Chronic back  pain; Hypomagnesemia; Other selective immunoglobulin deficiencies; Anemia, unspecified; Left hip pain; Steroid-induced diabetes mellitus; Right hip pain; Osteoporosis; Rash of neck; Creatinine elevation; Hyperkalemia; Chronic renal insufficiency; Fall; Diarrhea; and Weakness on his problem list.    is allergic to benadryl.    Medication List       This list is accurate as of: 02/15/15  4:52 PM.  Always use your most recent med list.                acyclovir 400 MG tablet  Commonly known as:  ZOVIRAX  TAKE 2 TABLETS BY MOUTH TWICE DAILY     budesonide 3 MG 24 hr capsule  Commonly known as:  ENTOCORT EC  Take 1 capsule (3 mg total) by mouth 3 (three) times daily.     CARTIA XT 240 MG 24 hr capsule  Generic drug:  diltiazem  TAKE 1 CAPSULE BY MOUTH ONCE DAILY     cholestyramine 4 G packet  Commonly known as:  QUESTRAN  Take 1 packet (4 g total) by mouth 2 (two) times daily.     fluconazole 100 MG tablet  Commonly known as:  DIFLUCAN  Take 1 tablet (100 mg total) by mouth once.     furosemide 20 MG tablet  Commonly known as:  LASIX  Take 1 tablet (20 mg total) by mouth daily.     glucose blood test strip  Commonly known as:  ONE TOUCH ULTRA TEST  Test before meals and at bedtime.     insulin aspart 100 unit/mL injection  Commonly known as:  novoLOG  Inject 8 Units into the skin 3 (three) times daily before meals.     Insulin Detemir 100 UNIT/ML Pen  Commonly known as:  LEVEMIR  Inject 24 Units into the skin daily at 10 pm.     Insulin Pen Needle 31G X 5 MM Misc  Commonly known as:  B-D UF III MINI PEN NEEDLES  Use twice daily with insulin as directed     labetalol 200 MG tablet  Commonly known as:  NORMODYNE  Take 2 tablets (400 mg total) by mouth 2 (two) times daily.     Lidocaine-Hydrocortisone Ace 3-0.5 % Kit  Apply 1 application topically as needed (for pain).     lidocaine-prilocaine cream  Commonly known as:  EMLA  Apply 1 application topically as needed. 1-2 hrs before each port access     lisinopril 10 MG tablet  Commonly known as:  PRINIVIL,ZESTRIL  Take 10 mg by mouth every evening.     loratadine 10 MG tablet  Commonly known as:  CLARITIN  Take 10 mg by mouth daily.     multivitamin with minerals Tabs tablet  Take 1 tablet by mouth daily.     omeprazole 20 MG capsule  Commonly known as:  PRILOSEC  Take 1 capsule (20 mg total) by mouth daily.     ONETOUCH DELICA LANCETS 85U  Misc  TEST BEFORE MEALS AND AT BEDTIME.     predniSONE 10 MG tablet  Commonly known as:  DELTASONE  Take 1 tablet (10 mg total) by mouth daily with breakfast. Take $RemoveBeforeD'20mg'[id number]$  in the morning and 10 mg at night x 1 week, then reduce to $RemoveB'20mg'XUThoQTv$  daily x 2 weeks     sertraline 50 MG tablet  Commonly known as:  ZOLOFT  ALTERNATE TAKING 1 TABLET DAILY AND 2 TABLETS DAILY     sulfamethoxazole-trimethoprim 800-160 MG per tablet  Commonly known as:  BACTRIM DS,SEPTRA DS  Take 1 tablet by mouth daily.     tacrolimus 0.5 MG capsule  Commonly known as:  PROGRAF  Take 3 capsules (1.5 mg total) by mouth 2 (two) times daily.     VOLTAREN 1 % Gel  Generic drug:  diclofenac sodium  Apply 2 g topically 2 (two) times daily. Applied to back         PHYSICAL EXAMINATION  142/81, HR 78, temp 98.2  Physical Exam  Constitutional: He is oriented to person, place, and time. Vital signs are normal. He appears unhealthy.  Patient appears weak, frail, and chronically ill.  HENT:  Head: Normocephalic and atraumatic.  Mouth/Throat: Oropharynx is clear and moist.  Eyes: Conjunctivae and EOM are normal. Pupils are equal, round, and reactive to light. Right eye exhibits no discharge. Left eye exhibits no discharge. No scleral icterus.  Neck: Normal range of motion. Neck supple.  Pulmonary/Chest: Effort normal and breath sounds normal. No respiratory distress.  Musculoskeletal: Normal range of motion. He exhibits tenderness. He exhibits no edema.  Mild tenderness to mid to lower back with palpation.  Patient was able to stand up from the chair independently today.  Observed with full range of motion; but ambulating with the assistance of a walker today.  Neurological: He is alert and oriented to person, place, and time.  Skin: Skin is warm and dry.  Patient has chronic bruising to his bilateral forearms; but also has a large bruise to his left posterior elbow region as well from the recent fall.  Patient with full  range of motion of all extremities.  Psychiatric: Affect normal.  Nursing note and vitals reviewed.   LABORATORY DATA:. Appointment on 02/15/2015  Component Date Value Ref Range Status  . WBC 02/15/2015 4.3  4.0 - 10.3 10e3/uL Final  . NEUT# 02/15/2015 2.6  1.5 - 6.5 10e3/uL Final  . HGB 02/15/2015 12.9* 13.0 - 17.1 g/dL Final  . HCT 02/15/2015 41.0  38.4 - 49.9 % Final  . Platelets 02/15/2015 129* 140 - 400 10e3/uL Final  . MCV 02/15/2015 94.7  79.3 - 98.0 fL Final  . MCH 02/15/2015 29.8  27.2 - 33.4 pg Final  . MCHC 02/15/2015 31.4* 32.0 - 36.0 g/dL Final  . RBC 02/15/2015 4.33  4.20 - 5.82 10e6/uL Final  . RDW 02/15/2015 16.3* 11.0 - 14.6 % Final  . lymph# 02/15/2015 1.2  0.9 - 3.3 10e3/uL Final  . MONO# 02/15/2015 0.4  0.1 - 0.9 10e3/uL Final  . Eosinophils Absolute 02/15/2015 0.0  0.0 - 0.5 10e3/uL Final  . Basophils Absolute 02/15/2015 0.0  0.0 - 0.1 10e3/uL Final  . NEUT% 02/15/2015 61.4  39.0 - 75.0 % Final  . LYMPH% 02/15/2015 27.7  14.0 - 49.0 % Final  . MONO% 02/15/2015 10.3  0.0 - 14.0 % Final  . EOS% 02/15/2015 0.4  0.0 - 7.0 % Final  . BASO% 02/15/2015 0.2  0.0 - 2.0 % Final  . Magnesium 02/15/2015 1.8  1.5 - 2.5 mg/dl Final  . Sodium 02/15/2015 137  136 - 145 mEq/L Final  . Potassium 02/15/2015 6.6* 3.5 - 5.1 mEq/L Final  . Chloride 02/15/2015 116* 98 - 109 mEq/L Final  . CO2 02/15/2015 16* 22 - 29 mEq/L Final  . Glucose 02/15/2015 185* 70 - 140 mg/dl Final  . BUN 02/15/2015 59.6* 7.0 - 26.0 mg/dL Final  . Creatinine 02/15/2015 1.6* 0.7 - 1.3 mg/dL Final  . Total Bilirubin 02/15/2015 0.38  0.20 - 1.20 mg/dL Final  . Alkaline  Phosphatase 02/15/2015 67  40 - 150 U/L Final  . AST 02/15/2015 26  5 - 34 U/L Final  . ALT 02/15/2015 53  0 - 55 U/L Final  . Total Protein 02/15/2015 5.7* 6.4 - 8.3 g/dL Final  . Albumin 02/15/2015 3.6  3.5 - 5.0 g/dL Final  . Calcium 02/15/2015 9.4  8.4 - 10.4 mg/dL Final  . Anion Gap 02/15/2015 5  3 - 11 mEq/L Final  . EGFR 02/15/2015  45* >90 ml/min/1.73 m2 Final   eGFR is calculated using the CKD-EPI Creatinine Equation (2009)     RADIOGRAPHIC STUDIES: Dg Thoracic Spine 2 View  02/15/2015   CLINICAL DATA:  Fall 2 days ago at home, upper and lower back pain, history of leukemia  EXAM: THORACIC SPINE - 2 VIEW  COMPARISON:  08/09/2014  FINDINGS: Three views of thoracic spine submitted. No acute fracture or subluxation. Right IJ Port-A-Cath with tip in SVC. Stable compression deformities mid and lower thoracic spine. Alignment is preserved. Degenerative changes mid and lower thoracic spine.  IMPRESSION: No acute fracture or subluxation. Stable chronic compression deformities mid and lower thoracic spine.   Electronically Signed   By: Lahoma Crocker M.D.   On: 02/15/2015 15:30   Dg Lumbar Spine 2-3 Views  02/15/2015   CLINICAL DATA:  Fall 2 days ago at home, back pain, thoracic pain  EXAM: LUMBAR SPINE - 2-3 VIEW  COMPARISON:  Thoracic spine MRI 8/19/ 15  FINDINGS: Two views of lumbar spine submitted. No acute fracture or subluxation. Stable compression deformities T11 and T12 vertebral bodies. Again noted disc space flattening at T12-L1 level. Multilevel moderate anterior spurring. There is mild compression deformity and prior vertebroplasty at L3 and L4 level. Facet degenerative changes are noted L4 and L5 level.  IMPRESSION: No acute fracture or subluxation. Prior vertebroplasty and mild compression deformities L3 and L4 vertebral bodies. Multilevel degenerative changes. Stable compression deformities of T12 and T11 vertebral bodies.   Electronically Signed   By: Lahoma Crocker M.D.   On: 02/15/2015 15:32    ASSESSMENT/PLAN:    Chronic renal insufficiency Patient suffers with chronic renal insufficiency.  Creatinine today was 1.6.  Will continue to monitor closely.   CLL (chronic lymphocytic leukemia) Patient is status post bone marrow transplant on 07/12/2012.  He does suffer with chronic graft versus host disease in the form of  chronic diarrhea; but is otherwise doing fairly well.  Patient has plans to return to the [facility] tomorrow 02/16/2015 for labs and a follow-up visit with Dr. Jana Hakim.  He is scheduled for restaging CT scans on 02/19/2015.  He will then return to the [facility] for labs and a follow-up visit to review scan results on 02/21/2015.   Diarrhea Patient does suffer with chronic diarrhea; most likely secondary to chronic graft versus host disease.  He has tried Imodium and Lomotil in the past.  He does continue to take Questran on a intermittent basis.  Patient was initiated on a tapering dose of prednisone recently; which did actually help with his diarrhea issues.  However, it is most likely the cause of patient's progressive weakness.  Patient was advised to continue pushing fluids as often as possible; to prevent dehydration.   Fall Patient is complaining of progressive weakness; most likely secondary to recent increased prednisone use.  Patient has had multiple falls recently; with the last fall just yesterday.  Patient states that he became weak and buckled at his knees while trying to angulate around his  house.  He fell; but denies hitting his head or any loss of consciousness.  He states that he was too weak to get up by himself; required the help of 2 family members to get up off the floor.  On exam-patient with extensive bruising to bilateral upper extremities; but denies any tenderness or decreased range of motion.  He is complaining of some mid to lower back discomfort since the fall.  Patient has a history of prior back surgery; and does also suffer with some mild chronic back pain as well.  Patient has Vicodin to take at home on an as-needed basis; but states that he rarely takes it.  Patient did obtain both the thoracic and lumbar plain film; which revealed no acute findings; no new compression fractures.  These results were reviewed with the patient this  afternoon.   Hyperkalemia Patient with chronic hyperkalemia.  Potassium today is elevated to 6.6.  Patient has been instructed to decrease his lisinopril from 25 mg down to 10 mg every day.  Patient was also given Lasix 10 mg IV today in hopes of decreasing potassium level.  Patient is scheduled to return for repeat labs tomorrow afternoon; and for a follow-up visit with Dr. Jana Hakim.   Hypomagnesemia Patient does also suffer with chronic hypomagnesemia.  Magnesium today was 1.8.  Patient received 1 g magnesium IV today.   Weakness Patient is complaining of progressive weakness; most likely secondary to recent increased prednisone use.  Patient has had multiple falls recently; with the last fall just yesterday.  Patient states that he became weak and buckled at his knees while trying to angulate around his house.  He fell; but denies hitting his head or any loss of consciousness.  He states that he was too weak to get up by himself; required the help of 2 family members to get up off the floor.  Hopefully the tapering of the prednisone will help with patient's progressive weakness issues. .    Patient stated understanding of all instructions; and was in agreement with this plan of care. The patient knows to call the clinic with any problems, questions or concerns.   Review/collaboration with Dr. Jana Hakim regarding all aspects of patient's visit today.   Total time spent with patient was 40 minutes;  with greater than 75 percent of that time spent in face to face counseling regarding patient's symptoms,  and coordination of care and follow up.  Disclaimer: This note was dictated with voice recognition software. Similar sounding words can inadvertently be transcribed and may not be corrected upon review.   Drue Second, NP 02/15/2015

## 2015-02-15 NOTE — Assessment & Plan Note (Addendum)
Patient is status post bone marrow transplant on 07/12/2012.  He does suffer with chronic graft versus host disease in the form of chronic diarrhea; but is otherwise doing fairly well.  Patient has plans to return to the [facility] tomorrow 02/16/2015 for labs and a follow-up visit with Dr. Jana Hakim.  He is scheduled for restaging CT scans on 02/19/2015.  He will then return to the [facility] for labs and a follow-up visit to review scan results on 02/21/2015.

## 2015-02-15 NOTE — Assessment & Plan Note (Signed)
Patient does suffer with chronic diarrhea; most likely secondary to chronic graft versus host disease.  He has tried Imodium and Lomotil in the past.  He does continue to take Questran on a intermittent basis.  Patient was initiated on a tapering dose of prednisone recently; which did actually help with his diarrhea issues.  However, it is most likely the cause of patient's progressive weakness.  Patient was advised to continue pushing fluids as often as possible; to prevent dehydration.

## 2015-02-16 ENCOUNTER — Telehealth: Payer: Self-pay | Admitting: Oncology

## 2015-02-16 ENCOUNTER — Other Ambulatory Visit (HOSPITAL_BASED_OUTPATIENT_CLINIC_OR_DEPARTMENT_OTHER): Payer: BC Managed Care – PPO

## 2015-02-16 ENCOUNTER — Ambulatory Visit (HOSPITAL_BASED_OUTPATIENT_CLINIC_OR_DEPARTMENT_OTHER): Payer: BC Managed Care – PPO | Admitting: Oncology

## 2015-02-16 VITALS — BP 128/76 | HR 78 | Temp 98.0°F | Resp 18 | Ht 66.0 in | Wt 215.2 lb

## 2015-02-16 DIAGNOSIS — D899 Disorder involving the immune mechanism, unspecified: Secondary | ICD-10-CM

## 2015-02-16 DIAGNOSIS — Z9489 Other transplanted organ and tissue status: Secondary | ICD-10-CM

## 2015-02-16 DIAGNOSIS — T8609 Other complications of bone marrow transplant: Secondary | ICD-10-CM

## 2015-02-16 DIAGNOSIS — T380X5A Adverse effect of glucocorticoids and synthetic analogues, initial encounter: Secondary | ICD-10-CM

## 2015-02-16 DIAGNOSIS — C9110 Chronic lymphocytic leukemia of B-cell type not having achieved remission: Secondary | ICD-10-CM

## 2015-02-16 DIAGNOSIS — M810 Age-related osteoporosis without current pathological fracture: Secondary | ICD-10-CM

## 2015-02-16 DIAGNOSIS — G8929 Other chronic pain: Secondary | ICD-10-CM

## 2015-02-16 DIAGNOSIS — D89811 Chronic graft-versus-host disease: Secondary | ICD-10-CM

## 2015-02-16 DIAGNOSIS — D849 Immunodeficiency, unspecified: Secondary | ICD-10-CM

## 2015-02-16 DIAGNOSIS — E099 Drug or chemical induced diabetes mellitus without complications: Secondary | ICD-10-CM

## 2015-02-16 DIAGNOSIS — M549 Dorsalgia, unspecified: Secondary | ICD-10-CM

## 2015-02-16 LAB — BASIC METABOLIC PANEL (CC13)
ANION GAP: 11 meq/L (ref 3–11)
BUN: 57.6 mg/dL — ABNORMAL HIGH (ref 7.0–26.0)
CALCIUM: 9 mg/dL (ref 8.4–10.4)
CO2: 15 meq/L — AB (ref 22–29)
Chloride: 115 mEq/L — ABNORMAL HIGH (ref 98–109)
Creatinine: 1.5 mg/dL — ABNORMAL HIGH (ref 0.7–1.3)
EGFR: 47 mL/min/{1.73_m2} — AB (ref >90)
GLUCOSE: 100 mg/dL (ref 70–140)
Potassium: 6.2 mEq/L (ref 3.5–5.1)
Sodium: 141 mEq/L (ref 136–145)

## 2015-02-16 MED ORDER — PREDNISONE 10 MG PO TABS: ORAL_TABLET | ORAL | Status: DC

## 2015-02-16 NOTE — Telephone Encounter (Signed)
returned pt call and confirmed Dr. Watt Climes appt....pt ok and aware of d.t

## 2015-02-16 NOTE — Telephone Encounter (Signed)
lvm for pt regarding to Dr. Perley Jain appt on 3.24 @ 3:30...sent msg to Blima Singer. to fax all records to [facility]

## 2015-02-16 NOTE — Progress Notes (Signed)
ID: Timothy [name]   DOB: April 02, 1946  MR#: [id number]  LYY#:[id number]  LEX:NTZG, [name], MD SU: OTHER MD: [name]; [name], MD; [name], [name],MD; [name], MD; [facility] in [place], [place]:  [name], RN [phone]), [name] M.D.  CHIEF COMPLAINT:  CLL, status post allogeneic stem cell transplant, GVHD CURRENT TREATMENT: Immunosuppression  HISTORY OF CLL: From the original intake note:  We have very complete records from Dr. Racheal Patches in [place], and in summary:  The patient was initially diagnosed in August 2000, with a white cell count of 23,600, but normal hemoglobin and platelets, and no significant symptomatology. Over the next several years his white cell count drifted up, and he eventually developed some symptoms of night sweats in particular, leading to treatment with fludarabine, Cytoxan and rituxan for five cycles given between December 2006 and May 2007.  We have CT scans from June 2006, November 2006 and April 2007, and comparing the November 2006 and April 2007 scans, there was near complete response. He had subsequent therapy in [place] as detailed below, but with decreased response, leading to allogeneic stem-cell transplant at the [facility] 02/24/2012.  Subsequent history is as detailed below.  INTERVAL HISTORY: [name] returns today for follow up of his chronic lymphoid leukemia and graft-versus-host disease accompanied his wife, Timothy [name]. To review, he fell at home 02/14/2015 after getting up from a chair. His knees just "gave way". He was unable to get up even with Paula's help. His son and grandson came from [place] and finally gotten back on his feet. He came here at the next day, was hydrated, and was found to have an elevated potassium. This is of course not a new problem for him and we had dropped his lisinopril to half dose hoping that would take care of the problem.  We also obtain plain films of his spine. He has of course the old compression fractures but no new compression fractures. He is here today to review those results and for a repeat of the potassium level as well as discussion of completion of his restaging studies.  REVIEW OF SYSTEMS: Elgar is keeping track of his heart rate, who may steps he takes, his weight, etc. This is very motivating for him. He had only 2 bowel movements 2 days ago, was back to the usual 4 yesterday. His bowel movements are mostly between the soft form and soft loose. There has been no blood or mucus. He continues to have tremors, secondary to his treatment, and these became much worse when we up his prednisone. There now getting back to baseline. Up in the prednisone, which did help the diarrhea, unfortunately through the blood sugars out of control and his insulin had to be adjusted by Dr. Brigitte Pulse. Gaelan is still using Questran to occasionally 3 times a day, of course in addition to Imodium. Timothy [name] remains concerned that he does not hydrate himself appropriately. A detailed review of systems today was otherwise stable  PAST MEDICAL HISTORY: Past Medical History  Diagnosis Date  . 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 Diverticular disease   . Hyperlipidemia   . Obesity   . Hypertension   . Hiatal hernia   . CMV (cytomegalovirus) antibody positive     pre-transplant, with seroconversion x2 pst-transplant  . Right bundle branch block     pre-transplant  . CKD (chronic kidney disease) stage 2, GFR 60-89 ml/min   . Pancytopenia   .  Steroid-induced diabetes   . Atrial fibrillation     post-transplant  . Myopathy   . 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 Leukemia, chronic lymphoid   . 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 Diarrhea in adult patient 12/05/2012    Due to active GVHD  . CLL (chronic lymphocytic  leukemia) 12/05/2012    Dx 07/1999; started Rx 12/06  AlloBMT 3/13  . Rash of face 12/05/2012    Due to GVHD  . Hypomagnesemia 01/26/2013  . Left hip pain 12/01/2013    PAST SURGICAL HISTORY: Past Surgical History  Procedure Laterality Date  . Tonsillectomy and adenoidectomy    . Bone marrow transplant    . Flexible sigmoidoscopy  11/17/2012    Procedure: FLEXIBLE SIGMOIDOSCOPY;  Surgeon: Jeryl Columbia, MD;  Location: [facility] END[facility];  Service: Endoscopy;  Laterality: N/A;  Dr Watt Climes states will be admitted to [facility]oom 1339 11/16/12  . Esophagogastroduodenoscopy  11/17/2012    Procedure: ESOPHAGOGASTRODUODENOSCOPY (EGD);  Surgeon: Jeryl Columbia, MD;  Location: [facility];  Service: Endoscopy;  Laterality: N/A;    FAMILY HISTORY Family History  Problem Relation Age of Onset  . Cancer Father   The patient's father died from complications of chronic lymphocytic leukemia at the age of 67.  It had been diagnosed seven years before when he was 48.  The patient is enrolled in a familial chronic lymphocytic leukemia study out of the [employer].  The patient's mother is 47, alive, unfortunately suffering with dementia, and he has a brother, 35, who is otherwise in fair health.   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  His wife of >40 years, Timothy [name], is a homemaker.  Their daughter, Timothy [name], lives in [place].  She also is a Agricultural engineer.  The patient has an 62 year old grandson and an 55-year-old granddaughter, and that is really the main reason he moved to this area.  He is a Tourist information centre manager.     ADVANCED DIRECTIVES: In place  HEALTH MAINTENANCE: (Updated 04/13/2014) History  Substance Use Topics  . Smoking status: Never Smoker   . Smokeless tobacco: Never Used  .  Alcohol Use: No     Colonoscopy: Nov 2013, Dr. Watt Climes  PSA: Not on file  Bone density:  Feb 2014;  Patient also has known insufficiency and pathologic fractures in addition to his long-standing history of steroid use.  Lipid panel: Jan 2015, elevated    Allergies  Allergen Reactions  . Benadryl [Diphenhydramine Hcl]     "Restless leg syndrome"    Current Outpatient Prescriptions  Medication Sig Dispense Refill  . acyclovir (ZOVIRAX) 400 MG tablet TAKE 2 TABLETS BY MOUTH TWICE DAILY 120 tablet 1  . budesonide (ENTOCORT EC) 3 MG 24 hr capsule Take 1 capsule (3 mg total) by mouth 3 (three) times daily. 90 capsule 6  . CARTIA XT 240 MG 24 hr capsule TAKE 1 CAPSULE BY MOUTH ONCE DAILY 30 capsule 3  . cholestyramine (QUESTRAN) 4 G packet Take 1 packet (4 g total) by mouth 2 (two) times daily. (Patient taking differently: Take 4 g by mouth 3 (three) times daily. ) 60 each 12  . fluconazole (DIFLUCAN) 100 MG tablet Take 1 tablet (100 mg total) by  mouth once. 30 tablet 3  . furosemide (LASIX) 20 MG tablet Take 1 tablet (20 mg total) by mouth daily. 30 tablet 1  . glucose blood (ONE TOUCH ULTRA TEST) test strip Test before meals and at bedtime. 300 each 2  . insulin aspart (NOVOLOG) 100 unit/mL injection Inject 8 Units into the skin 3 (three) times daily before meals.    . Insulin Detemir (LEVEMIR) 100 UNIT/ML Pen Inject 24 Units into the skin daily at 10 pm.    . Insulin Pen Needle (B-D UF III MINI PEN NEEDLES) 31G X 5 MM MISC Use twice daily with insulin as directed 100 each 5  . labetalol (NORMODYNE) 200 MG tablet Take 2 tablets (400 mg total) by mouth 2 (two) times daily. 120 tablet 1  . Lidocaine-Hydrocortisone Ace 3-0.5 % KIT Apply 1 application topically as needed (for pain). 1 each 3  . lidocaine-prilocaine (EMLA) cream Apply 1 application topically as needed. 1-2 hrs before each port access 30 g 6  . lisinopril (PRINIVIL,ZESTRIL) 10 MG tablet Take 10 mg by mouth every evening.     .  loratadine (CLARITIN) 10 MG tablet Take 10 mg by mouth daily.    . Multiple Vitamin (MULTIVITAMIN WITH MINERALS) TABS tablet Take 1 tablet by mouth daily.    Marland Kitchen omeprazole (PRILOSEC) 20 MG capsule Take 1 capsule (20 mg total) by mouth daily. 30 capsule 5  . ONETOUCH DELICA LANCETS 77L MISC TEST BEFORE MEALS AND AT BEDTIME. 300 each 0  . predniSONE (DELTASONE) 10 MG tablet Take 1 tablet (10 mg total) by mouth daily with breakfast. Take $RemoveBeforeD'20mg'HzJtyJpoOerBVf$  in the morning and 10 mg at night x 1 week, then reduce to $RemoveB'20mg'atsYjWQw$  daily x 2 weeks 30 tablet 1  . sertraline (ZOLOFT) 50 MG tablet ALTERNATE TAKING 1 TABLET DAILY AND 2 TABLETS DAILY 90 tablet 5  . sulfamethoxazole-trimethoprim (BACTRIM DS,SEPTRA DS) 800-160 MG per tablet Take 1 tablet by mouth daily. 30 tablet 5  . tacrolimus (PROGRAF) 0.5 MG capsule Take 3 capsules (1.5 mg total) by mouth 2 (two) times daily. 180 capsule 4  . VOLTAREN 1 % GEL Apply 2 g topically 2 (two) times daily. Applied to back    . [DISCONTINUED] insulin aspart (NOVOLOG FLEXPEN) 100 UNIT/ML SOPN FlexPen 18units sq qam, 9units sq qpm, or as directed 15 mL 1   No current facility-administered medications for this visit.   Facility-Administered Medications Ordered in Other Visits  Medication Dose Route Frequency Provider Last Rate Last Dose  . 0.9 %  sodium chloride infusion   Intravenous Continuous Amy G Berry, PA-C 500 mL/hr at 03/12/13 0900    . sodium chloride 0.9 % injection 10 mL  10 mL Intravenous PRN Chauncey Cruel, MD   10 mL at 08/11/12 1606    OBJECTIVE: Middle-aged white man who appears stated age  6 Vitals:   02/16/15 1444  BP: 128/76  Pulse: 78  Temp: 98 F (36.7 C)  Resp: 18  Body mass index is 34.75 kg/(m^2).  ECOG: 2 Filed Weights   02/16/15 1444  Weight: 215 lb 3.2 oz (97.614 kg)   Sclerae unicteric,  EOMs intact Showsno thrush or other lesions  No cervical or supraclavicular adenopathy Lungs no rales or rhonchi Heart regular rate and rhythm Abd soft,  obese, nontender, positive bowel sounds MSK no focal spinal tenderness to vigorous palpation  Neuro: nonfocal, well oriented,  positiveaffect Skin: Multiple ecchymosis but no rash suggestive of graft-versus-host disease activity   LABS:  CBC  Component Value Date/Time   WBC 4.3 02/15/2015 1146   WBC 7.1 04/06/2014 0949   RBC 4.33 02/15/2015 1146   RBC 4.06* 04/06/2014 0949   RBC 3.82* 03/16/2013 1400   HGB 12.9* 02/15/2015 1146   HGB 12.2* 04/06/2014 0949   HCT 41.0 02/15/2015 1146   HCT 38.5* 04/06/2014 0949   PLT 129* 02/15/2015 1146   PLT 144* 04/06/2014 0949   MCV 94.7 02/15/2015 1146   MCV 94.8 04/06/2014 0949   MCH 29.8 02/15/2015 1146   MCH 30.0 04/06/2014 0949   MCHC 31.4* 02/15/2015 1146   MCHC 31.7 04/06/2014 0949   RDW 16.3* 02/15/2015 1146   RDW 15.7* 04/06/2014 0949   LYMPHSABS 1.2 02/15/2015 1146   LYMPHSABS 1.4 03/18/2013 0615   MONOABS 0.4 02/15/2015 1146   MONOABS 0.3 03/18/2013 0615   EOSABS 0.0 02/15/2015 1146   EOSABS 0.0 03/18/2013 0615   BASOSABS 0.0 02/15/2015 1146   BASOSABS 0.0 03/18/2013 0615       Chemistry      Component Value Date/Time   NA 141 02/16/2015 1425   NA 139 09/07/2014 1324   K 6.2* 02/16/2015 1425   K 4.8 09/07/2014 1324   CL 110 09/07/2014 1324   CL 101 06/15/2013 1034   CO2 15* 02/16/2015 1425   CO2 15* 09/07/2014 1324   BUN 57.6* 02/16/2015 1425   BUN 46* 09/07/2014 1324   CREATININE 1.5* 02/16/2015 1425   CREATININE 1.20 09/07/2014 1324      Component Value Date/Time   CALCIUM 9.0 02/16/2015 1425   CALCIUM 9.1 09/07/2014 1324   ALKPHOS 67 02/15/2015 1146   ALKPHOS 80 09/07/2014 1324   AST 26 02/15/2015 1146   AST 40* 09/07/2014 1324   ALT 53 02/15/2015 1146   ALT 55* 09/07/2014 1324   BILITOT 0.38 02/15/2015 1146   BILITOT <0.2* 09/07/2014 1324     STUDIES: Dg Thoracic Spine 2 View  02/15/2015   CLINICAL DATA:  Fall 2 days ago at home, upper and lower back pain, history of leukemia  EXAM: THORACIC  SPINE - 2 VIEW  COMPARISON:  08/09/2014  FINDINGS: Three views of thoracic spine submitted. No acute fracture or subluxation. Right IJ Port-A-Cath with tip in SVC. Stable compression deformities mid and lower thoracic spine. Alignment is preserved. Degenerative changes mid and lower thoracic spine.  IMPRESSION: No acute fracture or subluxation. Stable chronic compression deformities mid and lower thoracic spine.   Electronically Signed   By: Lahoma Crocker M.D.   On: 02/15/2015 15:30   Dg Lumbar Spine 2-3 Views  02/15/2015   CLINICAL DATA:  Fall 2 days ago at home, back pain, thoracic pain  EXAM: LUMBAR SPINE - 2-3 VIEW  COMPARISON:  Thoracic spine MRI 8/19/ 15  FINDINGS: Two views of lumbar spine submitted. No acute fracture or subluxation. Stable compression deformities T11 and T12 vertebral bodies. Again noted disc space flattening at T12-L1 level. Multilevel moderate anterior spurring. There is mild compression deformity and prior vertebroplasty at L3 and L4 level. Facet degenerative changes are noted L4 and L5 level.  IMPRESSION: No acute fracture or subluxation. Prior vertebroplasty and mild compression deformities L3 and L4 vertebral bodies. Multilevel degenerative changes. Stable compression deformities of T12 and T11 vertebral bodies.   Electronically Signed   By: Lahoma Crocker M.D.   On: 02/15/2015 15:32     ASSESSMENT: 69 y.o. [place] man with a history of well-differentiated lymphocytic lymphoma/ chronic lymphoid leukemia initially diagnosed in 2000, not requiring intervention until  2006; with multiple chromosomal abnormalities.  His treatment history is as follows:  (1) fludarabine/cyclophosphamide/rituximab x5 completed May 2007.   (2) rituximab for 8 doses October 2010, with partial response   (3) Leustatin and ofatumumab weekly x8 July to September 2011 followed by maintenance ofatumumab  every 2 months, with initial response but rising counts September 2012   (4) status-post unrelated  donor stem-cell transplant 02/24/2012 at the [facility]  (a) conditioning regimen consisted of fludarabine + TBI at 200 cGy, followed by rituximab x27;  (b) CMV reactivation x3 (patient CMV positive, donor negative), s/p ganciclovir treatment; 3d reactivation August 2013, s/p gancyclovir, with negative PCR mid-September 2013; last gancyclovir dose 10/06/2012 (c) Chronic GVHD: involving gut and skin, treated with steroids, tacrolimus and MMF.  MMF was eventually d/c'd and tacrolimus currently at a dose of 1.$RemoveB'5mg'MziCUZcw$  BID (d) atrial fibrillation: resolved on brief amiodarone regimen (e) steroid-induced myopathy: improving  (f) hypomagnesemia: improved after d/c gancyclovir, needs continuing support (g) hypogammaglobulinemia: requiring IVIG most recently 08/03/2014. (h) history of elevated triglycerides (606 on 07/14/2012)  (i) adrenal insufficiency: on prednisone and budesonide (j) pancytopenia,resolved (k) brief episode of neutropenia ([place] [address]) February 2015, accompanied by diarrhea; resolved   (5) restaging studies February-March 2015 including CT scans, flow cytometry, and bone marrow biopsy, showed no evidence of residual chronic lymphoid leukemia.  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 Status post kyphoplasty at L3-4 in June 2014. Also with evidence of rib fractures and insufficiency fractures bilaterally of the sacral  alae, noted by CT in March 2015. --   Denosumab  started 12/08/2013, given as prolia Q6 months which is what has been approved by his insurance, most recent dose 10/02/2014  (12) chronic back pain and hip pain controlled with OxyContin and hydrocodone/APAP.  (12) nausea: well controlled on current meds  (13)  Positive c.diff, 03/08/2013, on Flagyl 500 mg TID x 20 days, then on oral vanco with Questran, showing improvement; positive when repeated April 2014; Negative x 3 since then  (14) persistently increased BUN and potassium  (15)  Hypertension, on labetalol, cardizem, lisinopril, and furosemide; managed by Dr. Brigitte Pulse  (16) steroid induced hyperglycemia, on sterlix and 70/30 insulin, followed by Dr. Loanne Drilling and Dr. Brigitte Pulse  (17) hypogammaglobulinemia-- requiring intermittent supplementation, most recent dose 10/19/2014  (18) squamous cell CA in situ removed from left parietal scalp October 2014, second lesion to be removed may 2015   PLAN: Jaben probably developed some increase muscle weakness secondary to the increased prednisone dose. The steroids did help the diarrhea, but he is now back at 10 mg daily and doing much better on that dose. Possibly his insulin needs may go back to the earlier levels. Overall though, this was a setback for him, and he will have to double up on his exercise attempts to get back where he was. Currently he is using the walker again, and I think this is the safest option for the time being  We have been struggling with his potassium, and it remained high even though I cut down on his lisinopril and despite the  sodium bicarbonate being added. Today I just suggested [place] completely stop the lisinopril. We will follow his blood pressure closely. Of course lisinopril has multiple beneficial effects in a patient like him. For now though I think avoiding that medication is the better option.  We are in the process of restaging him as he approaches his third anniversary post transplant. He will need a bone marrow biopsy  and a colonoscopy. This is being set up.  Otherwise she will return next week for labs in his magnesium infusion. I'm making no other changes in his treatment for now. He will call for any problems that may develop before that visit.Marland Kitchen Chauncey Cruel, MD 02/16/2015

## 2015-02-19 ENCOUNTER — Telehealth: Payer: Self-pay | Admitting: *Deleted

## 2015-02-19 ENCOUNTER — Other Ambulatory Visit: Payer: Self-pay | Admitting: *Deleted

## 2015-02-19 ENCOUNTER — Ambulatory Visit (HOSPITAL_COMMUNITY)
Admission: RE | Admit: 2015-02-19 | Discharge: 2015-02-19 | Disposition: A | Discharge status: Home / Self Care | Payer: BC Managed Care – PPO | Source: Ambulatory Visit | Attending: Oncology | Admitting: Oncology

## 2015-02-19 ENCOUNTER — Encounter (HOSPITAL_COMMUNITY): Payer: Self-pay

## 2015-02-19 DIAGNOSIS — Z9489 Other transplanted organ and tissue status: Secondary | ICD-10-CM

## 2015-02-19 DIAGNOSIS — D89811 Chronic graft-versus-host disease: Secondary | ICD-10-CM

## 2015-02-19 DIAGNOSIS — D849 Immunodeficiency, unspecified: Secondary | ICD-10-CM

## 2015-02-19 DIAGNOSIS — M810 Age-related osteoporosis without current pathological fracture: Secondary | ICD-10-CM

## 2015-02-19 DIAGNOSIS — Z9481 Bone marrow transplant status: Secondary | ICD-10-CM | POA: Diagnosis not present

## 2015-02-19 DIAGNOSIS — T8609 Other complications of bone marrow transplant: Secondary | ICD-10-CM

## 2015-02-19 DIAGNOSIS — E099 Drug or chemical induced diabetes mellitus without complications: Secondary | ICD-10-CM

## 2015-02-19 DIAGNOSIS — M549 Dorsalgia, unspecified: Secondary | ICD-10-CM

## 2015-02-19 DIAGNOSIS — G8929 Other chronic pain: Secondary | ICD-10-CM

## 2015-02-19 DIAGNOSIS — C9110 Chronic lymphocytic leukemia of B-cell type not having achieved remission: Secondary | ICD-10-CM | POA: Insufficient documentation

## 2015-02-19 DIAGNOSIS — D899 Disorder involving the immune mechanism, unspecified: Secondary | ICD-10-CM

## 2015-02-19 DIAGNOSIS — T380X5A Adverse effect of glucocorticoids and synthetic analogues, initial encounter: Secondary | ICD-10-CM

## 2015-02-19 MED ORDER — IOHEXOL 300 MG/ML  SOLN
100.0000 mL | Freq: Once | INTRAMUSCULAR | Status: AC | PRN
  Administered 2015-02-19: 100 mL via INTRAVENOUS

## 2015-02-19 MED ORDER — PREDNISONE 10 MG PO TABS: ORAL_TABLET | ORAL | Status: DC

## 2015-02-19 NOTE — Telephone Encounter (Signed)
THE FAXED REPORT WAS GIVEN TO DR.MAGRINAT'S NURSE, VAL DODD,RN.

## 2015-02-19 NOTE — Telephone Encounter (Signed)
This RN called pt per MD request to inform him of NED in CT scans but noted area of fx in T spine.  At present Timothy [name] states he is not having discomfort- he has had 2 previous kyphoplasty procedures and would prefer not to proceed at this time if not needed.  No other needs at this time.  MD made aware of the above.

## 2015-02-21 ENCOUNTER — Telehealth: Payer: Self-pay | Admitting: Oncology

## 2015-02-21 ENCOUNTER — Ambulatory Visit: Payer: Self-pay | Admitting: Oncology

## 2015-02-21 ENCOUNTER — Other Ambulatory Visit: Payer: Self-pay | Admitting: Emergency Medicine

## 2015-02-21 ENCOUNTER — Telehealth: Payer: Self-pay | Admitting: *Deleted

## 2015-02-21 ENCOUNTER — Other Ambulatory Visit (HOSPITAL_BASED_OUTPATIENT_CLINIC_OR_DEPARTMENT_OTHER): Payer: BC Managed Care – PPO

## 2015-02-21 ENCOUNTER — Ambulatory Visit (HOSPITAL_BASED_OUTPATIENT_CLINIC_OR_DEPARTMENT_OTHER): Payer: BC Managed Care – PPO

## 2015-02-21 ENCOUNTER — Other Ambulatory Visit: Payer: Self-pay

## 2015-02-21 DIAGNOSIS — C9110 Chronic lymphocytic leukemia of B-cell type not having achieved remission: Secondary | ICD-10-CM

## 2015-02-21 LAB — CBC WITH DIFFERENTIAL/PLATELET
BASO%: 0 % (ref 0.0–2.0)
Basophils Absolute: 0 10*3/uL (ref 0.0–0.1)
EOS%: 0.3 % (ref 0.0–7.0)
Eosinophils Absolute: 0 10*3/uL (ref 0.0–0.5)
HEMATOCRIT: 37.6 % — AB (ref 38.4–49.9)
HEMOGLOBIN: 12.5 g/dL — AB (ref 13.0–17.1)
LYMPH#: 1.3 10*3/uL (ref 0.9–3.3)
LYMPH%: 38.8 % (ref 14.0–49.0)
MCH: 31 pg (ref 27.2–33.4)
MCHC: 33.2 g/dL (ref 32.0–36.0)
MCV: 93.3 fL (ref 79.3–98.0)
MONO#: 0.3 10*3/uL (ref 0.1–0.9)
MONO%: 7.8 % (ref 0.0–14.0)
NEUT#: 1.7 10*3/uL (ref 1.5–6.5)
NEUT%: 53.1 % (ref 39.0–75.0)
PLATELETS: 110 10*3/uL — AB (ref 140–400)
RBC: 4.03 10*6/uL — ABNORMAL LOW (ref 4.20–5.82)
RDW: 16.1 % — ABNORMAL HIGH (ref 11.0–14.6)
WBC: 3.2 10*3/uL — AB (ref 4.0–10.3)

## 2015-02-21 LAB — COMPREHENSIVE METABOLIC PANEL (CC13)
ALK PHOS: 91 U/L (ref 40–150)
ALT: 54 U/L (ref 0–55)
ANION GAP: 10 meq/L (ref 3–11)
AST: 30 U/L (ref 5–34)
Albumin: 3.5 g/dL (ref 3.5–5.0)
BILIRUBIN TOTAL: 0.32 mg/dL (ref 0.20–1.20)
BUN: 51.5 mg/dL — ABNORMAL HIGH (ref 7.0–26.0)
CO2: 14 mEq/L — ABNORMAL LOW (ref 22–29)
CREATININE: 1.5 mg/dL — AB (ref 0.7–1.3)
Calcium: 8.4 mg/dL (ref 8.4–10.4)
Chloride: 117 mEq/L — ABNORMAL HIGH (ref 98–109)
EGFR: 46 mL/min/{1.73_m2} — AB (ref >90)
GLUCOSE: 173 mg/dL — AB (ref 70–140)
Potassium: 5.4 mEq/L — ABNORMAL HIGH (ref 3.5–5.1)
Sodium: 141 mEq/L (ref 136–145)
Total Protein: 5.5 g/dL — ABNORMAL LOW (ref 6.4–8.3)

## 2015-02-21 LAB — MAGNESIUM (CC13): Magnesium: 1.7 mg/dl (ref 1.5–2.5)

## 2015-02-21 MED ORDER — HEPARIN SOD (PORK) LOCK FLUSH 100 UNIT/ML IV SOLN
500.0000 [IU] | Freq: Once | INTRAVENOUS | Status: AC | PRN
  Administered 2015-02-21: 500 [IU]
  Filled 2015-02-21: qty 5

## 2015-02-21 MED ORDER — SODIUM CHLORIDE 0.9 % IV SOLN
1.0000 g | Freq: Once | INTRAVENOUS | Status: AC
  Administered 2015-02-21: 1 g via INTRAVENOUS
  Filled 2015-02-21: qty 2

## 2015-02-21 NOTE — Telephone Encounter (Signed)
Per staff message and POF I have scheduled appts. Advised scheduler of appts. JMW  

## 2015-02-21 NOTE — Patient Instructions (Signed)
Hyperkalemia Hyperkalemia is when you have too much potassium in your blood. This can be a life-threatening condition. Potassium is normally removed (excreted) from the body by the kidneys. CAUSES  The potassium level in your body can become too high for the following reasons:  You take in too much potassium. You can do this by:  Using salt substitutes. They contain large amounts of potassium.  Taking potassium supplements from your caregiver. The dose may be too high for you.  Eating foods or taking nutritional products with potassium.  You excrete too little potassium. This can happen if:  Your kidneys are not functioning properly. Kidney (renal) disease is a very common cause of hyperkalemia.  You are taking medicines that lower your excretion of potassium, such as certain diuretic medicines.  You have an adrenal gland disease called Addison's disease.  You have a urinary tract obstruction, such as kidney stones.  You are on treatment to mechanically clean your blood (dialysis) and you skip a treatment.  You release a high amount of potassium from your cells into your blood. You may have a condition that causes potassium to move from your cells to your bloodstream. This can happen with:  Injury to muscles or other tissues. Most potassium is stored in the muscles.  Severe burns or infections.  Acidic blood plasma (acidosis). Acidosis can result from many diseases, such as uncontrolled diabetes. SYMPTOMS  Usually, there are no symptoms unless the potassium is dangerously high or has risen very quickly. Symptoms may include:  Irregular or very slow heartbeat.  Feeling sick to your stomach (nauseous).  Tiredness (fatigue).  Nerve problems such as tingling of the skin, numbness of the hands or feet, weakness, or paralysis. DIAGNOSIS  A simple blood test can measure the amount of potassium in your body. An electrocardiogram test of the heart can also help make the diagnosis.  The heart may beat dangerously fast or slow down and stop beating with severe hyperkalemia.  TREATMENT  Treatment depends on how bad the condition is and on the underlying cause.  If the hyperkalemia is an emergency (causing heart problems or paralysis), many different medicines can be used alone or together to lower the potassium level briefly. This may include an insulin injection even if you are not diabetic. Emergency dialysis may be needed to remove potassium from the body.  If the hyperkalemia is less severe or dangerous, the underlying cause is treated. This can include taking medicines if needed. Your prescription medicines may be changed. You may also need to take a medicine to help your body get rid of potassium. You may need to eat a diet low in potassium. HOME CARE INSTRUCTIONS   Take medicines and supplements as directed by your caregiver.  Do not take any over-the-counter medicines, supplements, natural products, herbs, or vitamins without reviewing them with your caregiver. Certain supplements and natural food products can have high amounts of potassium. Other products (such as ibuprofen) can damage weak kidneys and raise your potassium.  You may be asked to do repeat lab tests. Be sure to follow these directions.  If you have kidney disease, you may need to follow a low potassium diet. SEEK MEDICAL CARE IF:   You notice an irregular or very slow heartbeat.  You feel lightheaded.  You develop weakness that is unusual for you. SEEK IMMEDIATE MEDICAL CARE IF:   You have shortness of breath.  You have chest discomfort.  You pass out (faint). MAKE SURE YOU:   Understand   these instructions.  Will watch your condition.  Will get help right away if you are not doing well or get worse. Document Released: 11/28/2002 Document Revised: 03/01/2012 Document Reviewed: 03/15/2014 ExitCare Patient Information 2015 [employer]. This information is not intended to replace  advice given to you by your health care provider. Make sure you discuss any questions you have with your health care provider.  

## 2015-02-21 NOTE — Telephone Encounter (Signed)
appts have been made per pof and an email has been sent to  [place] to add ivf,pt will get an avs at end of ivf today  anne

## 2015-02-22 ENCOUNTER — Encounter: Payer: Self-pay | Admitting: *Deleted

## 2015-02-23 ENCOUNTER — Other Ambulatory Visit: Payer: Self-pay | Admitting: Oncology

## 2015-02-26 ENCOUNTER — Other Ambulatory Visit: Payer: Self-pay | Admitting: Radiology

## 2015-02-27 ENCOUNTER — Encounter (HOSPITAL_COMMUNITY): Payer: Self-pay

## 2015-02-27 ENCOUNTER — Ambulatory Visit (HOSPITAL_COMMUNITY)
Admission: RE | Admit: 2015-02-27 | Discharge: 2015-02-27 | Disposition: A | Discharge status: Home / Self Care | Payer: BC Managed Care – PPO | Source: Ambulatory Visit | Attending: Oncology | Admitting: Oncology

## 2015-02-27 ENCOUNTER — Telehealth: Payer: Self-pay | Admitting: *Deleted

## 2015-02-27 DIAGNOSIS — E099 Drug or chemical induced diabetes mellitus without complications: Secondary | ICD-10-CM | POA: Diagnosis not present

## 2015-02-27 DIAGNOSIS — Z79899 Other long term (current) drug therapy: Secondary | ICD-10-CM | POA: Diagnosis not present

## 2015-02-27 DIAGNOSIS — D61818 Other pancytopenia: Secondary | ICD-10-CM | POA: Insufficient documentation

## 2015-02-27 DIAGNOSIS — I129 Hypertensive chronic kidney disease with stage 1 through stage 4 chronic kidney disease, or unspecified chronic kidney disease: Secondary | ICD-10-CM | POA: Diagnosis not present

## 2015-02-27 DIAGNOSIS — E785 Hyperlipidemia, unspecified: Secondary | ICD-10-CM | POA: Insufficient documentation

## 2015-02-27 DIAGNOSIS — Z9481 Bone marrow transplant status: Secondary | ICD-10-CM | POA: Diagnosis not present

## 2015-02-27 DIAGNOSIS — N182 Chronic kidney disease, stage 2 (mild): Secondary | ICD-10-CM | POA: Insufficient documentation

## 2015-02-27 DIAGNOSIS — I4891 Unspecified atrial fibrillation: Secondary | ICD-10-CM | POA: Diagnosis not present

## 2015-02-27 DIAGNOSIS — C9110 Chronic lymphocytic leukemia of B-cell type not having achieved remission: Secondary | ICD-10-CM | POA: Diagnosis not present

## 2015-02-27 DIAGNOSIS — Z794 Long term (current) use of insulin: Secondary | ICD-10-CM | POA: Insufficient documentation

## 2015-02-27 DIAGNOSIS — E669 Obesity, unspecified: Secondary | ICD-10-CM | POA: Diagnosis not present

## 2015-02-27 LAB — CBC
HEMATOCRIT: 34.1 % — AB (ref 39.0–52.0)
Hemoglobin: 11.3 g/dL — ABNORMAL LOW (ref 13.0–17.0)
MCH: 31 pg (ref 26.0–34.0)
MCHC: 33.1 g/dL (ref 30.0–36.0)
MCV: 93.7 fL (ref 78.0–100.0)
Platelets: 120 10*3/uL — ABNORMAL LOW (ref 150–400)
RBC: 3.64 MIL/uL — ABNORMAL LOW (ref 4.22–5.81)
RDW: 16.2 % — AB (ref 11.5–15.5)
WBC: 3.2 10*3/uL — ABNORMAL LOW (ref 4.0–10.5)

## 2015-02-27 LAB — APTT: aPTT: 26 seconds (ref 24–37)

## 2015-02-27 LAB — BASIC METABOLIC PANEL
ANION GAP: 2 — AB (ref 5–15)
BUN: 40 mg/dL — AB (ref 6–23)
CALCIUM: 8.2 mg/dL — AB (ref 8.4–10.5)
CO2: 18 mmol/L — AB (ref 19–32)
CREATININE: 1.44 mg/dL — AB (ref 0.50–1.35)
Chloride: 117 mmol/L — ABNORMAL HIGH (ref 96–112)
GFR calc Af Amer: 56 mL/min — ABNORMAL LOW (ref >90)
GFR calc non Af Amer: 48 mL/min — ABNORMAL LOW (ref >90)
GLUCOSE: 198 mg/dL — AB (ref 70–99)
Potassium: 4.8 mmol/L (ref 3.5–5.1)
SODIUM: 137 mmol/L (ref 135–145)

## 2015-02-27 LAB — PROTIME-INR
INR: 1.03 (ref 0.00–1.49)
Prothrombin Time: 13.6 seconds (ref 11.6–15.2)

## 2015-02-27 LAB — GLUCOSE, CAPILLARY: Glucose-Capillary: 184 mg/dL — ABNORMAL HIGH (ref 70–99)

## 2015-02-27 MED ORDER — HYDROCODONE-ACETAMINOPHEN 5-325 MG PO TABS
1.0000 | ORAL_TABLET | ORAL | Status: DC | PRN
  Filled 2015-02-27: qty 2

## 2015-02-27 MED ORDER — HYDROMORPHONE HCL 2 MG/ML IJ SOLN
INTRAMUSCULAR | Status: AC
  Filled 2015-02-27: qty 2

## 2015-02-27 MED ORDER — FENTANYL CITRATE 0.05 MG/ML IJ SOLN
INTRAMUSCULAR | Status: AC
  Filled 2015-02-27: qty 4

## 2015-02-27 MED ORDER — SODIUM CHLORIDE 0.9 % IV SOLN
INTRAVENOUS | Status: DC
Start: 2015-02-27 — End: 2015-02-28
  Administered 2015-02-27: 500 mL via INTRAVENOUS

## 2015-02-27 MED ORDER — HEPARIN SOD (PORK) LOCK FLUSH 100 UNIT/ML IV SOLN
500.0000 [IU] | INTRAVENOUS | Status: AC | PRN
  Administered 2015-02-27: 500 [IU]
  Filled 2015-02-27: qty 5

## 2015-02-27 MED ORDER — FENTANYL CITRATE 0.05 MG/ML IJ SOLN
INTRAMUSCULAR | Status: AC | PRN
  Administered 2015-02-27: 50 ug via INTRAVENOUS

## 2015-02-27 NOTE — H&P (Signed)
Chief Complaint: "I'm here for a bone marrow biopsy" Referring Physician:Magrinat HPI: Timothy [name] is an 69 y.o. male with hx of CLL and has also had bone marrow transplant about 3 years ago. He is scheduled today for CT guided BM bx. He has had several of these in the past. Ate some toast this morning but willing to have procedure without sedation.   Past Medical History:  Past Medical History  Diagnosis Date  . 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 Diverticular disease   . Hyperlipidemia   . Obesity   . Hypertension   . Hiatal hernia   . CMV (cytomegalovirus) antibody positive     pre-transplant, with seroconversion x2 pst-transplant  . Right bundle branch block     pre-transplant  . CKD (chronic kidney disease) stage 2, GFR 60-89 ml/min   . Pancytopenia   . Atrial fibrillation     post-transplant  . Myopathy   . 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 Diarrhea in adult patient 12/05/2012    Due to active GVHD  . Rash of face 12/05/2012    Due to GVHD  . Hypomagnesemia 01/26/2013  . Left hip pain 12/01/2013  . Steroid-induced diabetes     novalog  . Leukemia, chronic lymphoid   . CLL (chronic lymphocytic leukemia) 12/05/2012    Dx 07/1999; started Rx 12/06  AlloBMT 3/13    Past Surgical History:  Past Surgical History  Procedure Laterality Date  . Tonsillectomy and adenoidectomy    . Bone marrow transplant    . Flexible sigmoidoscopy  11/17/2012    Procedure: FLEXIBLE SIGMOIDOSCOPY;  Surgeon: Jeryl Columbia, MD;  Location: [facility];  Service: Endoscopy;  Laterality: N/A;  Dr Watt Climes states will be admitted to rooom 1339 11/16/12  . Esophagogastroduodenoscopy  11/17/2012    Procedure: ESOPHAGOGASTRODUODENOSCOPY (EGD);  Surgeon: Jeryl Columbia, MD;  Location: [facility];  Service: Endoscopy;  Laterality: N/A;     Family History:  Family History  Problem Relation Age of Onset  . Cancer Father     Social History:  reports that he has never smoked. He has never used smokeless tobacco. He reports that he does not drink alcohol or use illicit drugs.  Allergies:  Allergies  Allergen Reactions  . Benadryl [Diphenhydramine Hcl]     "Restless leg syndrome"    Medications:   Medication List    ASK your doctor about these medications        acyclovir 400 MG tablet  Commonly known as:  ZOVIRAX  TAKE 2 TABLETS BY MOUTH TWICE DAILY     budesonide 3 MG 24 hr capsule  Commonly known as:  ENTOCORT EC  Take 1 capsule (3 mg total) by mouth 3 (three) times daily.     CARTIA XT 240 MG 24 hr capsule  Generic drug:  diltiazem  TAKE 1 CAPSULE BY MOUTH ONCE DAILY     cholestyramine 4 G packet  Commonly known as:  QUESTRAN  Take 1 packet (4 g total) by mouth 2 (two) times daily.     fluconazole 100 MG tablet  Commonly known as:  DIFLUCAN  Take 1 tablet (100 mg total) by mouth once.     furosemide 20 MG tablet  Commonly known as:  LASIX  Take 1 tablet (20 mg total) by mouth daily.     glucose  blood test strip  Commonly known as:  ONE TOUCH ULTRA TEST  Test before meals and at bedtime.     insulin aspart 100 unit/mL injection  Commonly known as:  novoLOG  Inject 10 Units into the skin 3 (three) times daily before meals.     Insulin Detemir 100 UNIT/ML Pen  Commonly known as:  LEVEMIR  Inject 47 Units into the skin daily.     Insulin Pen Needle 31G X 5 MM Misc  Commonly known as:  B-D UF III MINI PEN NEEDLES  Use twice daily with insulin as directed     labetalol 200 MG tablet  Commonly known as:  NORMODYNE  Take 2 tablets (400 mg total) by mouth 2 (two) times daily.     Lidocaine-Hydrocortisone Ace 3-0.5 % Kit  Apply 1 application topically as needed (for pain).     lidocaine-prilocaine cream  Commonly known as:  EMLA  Apply 1 application topically as needed. 1-2 hrs before  each port access     lisinopril 10 MG tablet  Commonly known as:  PRINIVIL,ZESTRIL  Take 2.5 mg by mouth daily.     loratadine 10 MG tablet  Commonly known as:  CLARITIN  Take 10 mg by mouth daily.     omeprazole 20 MG capsule  Commonly known as:  PRILOSEC  Take 1 capsule (20 mg total) by mouth daily.     ONETOUCH DELICA LANCETS 13K Misc  TEST BEFORE MEALS AND AT BEDTIME.     predniSONE 10 MG tablet  Commonly known as:  DELTASONE  Take $Rem'20mg'bGOt$  in the morning with breakfast     sertraline 50 MG tablet  Commonly known as:  ZOLOFT  ALTERNATE TAKING 1 TABLET DAILY AND 2 TABLETS DAILY     sodium bicarbonate 325 MG tablet  Take 650 mg by mouth 2 (two) times daily.     sulfamethoxazole-trimethoprim 800-160 MG per tablet  Commonly known as:  BACTRIM DS,SEPTRA DS  Take 1 tablet by mouth daily.     tacrolimus 0.5 MG capsule  Commonly known as:  PROGRAF  Take 3 capsules (1.5 mg total) by mouth 2 (two) times daily.     VOLTAREN 1 % Gel  Generic drug:  diclofenac sodium  Apply 2 g topically 2 (two) times daily. Applied to back        Please HPI for pertinent positives, otherwise complete 10 system ROS negative.  Physical Exam: BP 145/74 mmHg  Pulse 64  Temp(Src) 98.4 F (36.9 C) (Oral)  Resp 18  SpO2 94% There is no weight on file to calculate BMI.   General Appearance:  Alert, cooperative, no distress, appears stated age  Head:  Normocephalic, without obvious abnormality, atraumatic  ENT: Unremarkable  Neck: Supple, symmetrical, trachea midline  Lungs:   Clear to auscultation bilaterally, no w/r/r  Chest Wall:  No tenderness or deformity  Heart:  Regular rate and rhythm, S1, S2 normal, no murmur, rub or gallop.  Neurologic: Normal affect, no gross deficits.  Labs: Results for orders placed or performed during the hospital encounter of 02/27/15 (from the past 48 hour(s))  CBC upon arrival     Status: Abnormal   Collection Time: 02/27/15  8:05 AM  Result Value Ref  Range   WBC 3.2 (L) 4.0 - 10.5 K/uL   RBC 3.64 (L) 4.22 - 5.81 MIL/uL   Hemoglobin 11.3 (L) 13.0 - 17.0 g/dL   HCT 34.1 (L) 39.0 - 52.0 %   MCV 93.7 78.0 - 100.0  fL   MCH 31.0 26.0 - 34.0 pg   MCHC 33.1 30.0 - 36.0 g/dL   RDW 16.2 (H) 11.5 - 15.5 %   Platelets 120 (L) 150 - 400 K/uL   *Note: Due to a large number of results and/or encounters for the requested time period, some results have not been displayed. A complete set of results can be found in Results Review.    Imaging: No results found.  Assessment/Plan Hx of CLL and prior bone marrow transplant For CT guided bone marrow biopsy. Risks and Benefits discussed with the patient including, but not limited to bleeding, infection, damage to adjacent structures or low yield requiring additional tests. All of the patient's questions were answered, patient is agreeable to proceed. Consent signed and in chart.   Ascencion Dike PA-C 02/27/2015, 8:26 AM

## 2015-02-27 NOTE — Progress Notes (Signed)
Spoke to Dr. Barbie Banner regarding patient having toast this am at 6:30. With 2 oz of water. Patient to decide if he wants to reschedule or continue with procedure with out sedation. Patient wants to proceed without sedation. Wife in agreement with patient.

## 2015-02-27 NOTE — Discharge Instructions (Signed)
Bone Marrow Aspiration, Bone Marrow Biopsy Care After  Call [facility] Radiology for any problems or questions at [phone].  No change in diet or medications. Leave dressing on for 24 hours them may remove and bathe as usual. Tylenol for pain if needed. If no relief call number above. Light activity for remainder of day.   Read the instructions outlined below and refer to this sheet in the next few weeks. These discharge instructions provide you with general information on caring for yourself after you leave the hospital. Your caregiver may also give you specific instructions. While your treatment has been planned according to the most current medical practices available, unavoidable complications occasionally occur. If you have any problems or questions after discharge, call your caregiver. FINDING OUT THE RESULTS OF YOUR TEST Not all test results are available during your visit. If your test results are not back during the visit, make an appointment with your caregiver to find out the results. Do not assume everything is normal if you have not heard from your caregiver or the medical facility. It is important for you to follow up on all of your test results.  HOME CARE INSTRUCTIONS  You have had sedation and may be sleepy or dizzy. Your thinking may not be as clear as usual. For the next 24 hours:  Only take over-the-counter or prescription medicines for pain, discomfort, and or fever as directed by your caregiver.  Do not drink alcohol.  Do not smoke.  Do not drive.  Do not make important legal decisions.  Do not operate heavy machinery.  Do not care for small children by yourself.  Keep your dressing clean and dry. You may replace dressing with a bandage after 24 hours.  You may take a bath or shower after 24 hours.  Use an ice pack for 20 minutes every 2 hours while awake for pain as needed. SEEK MEDICAL CARE IF:   There is redness, swelling, or increasing pain at the biopsy  site.  There is pus coming from the biopsy site.  There is drainage from a biopsy site lasting longer than one day.  An unexplained oral temperature above 102 F (38.9 C) develops. SEEK IMMEDIATE MEDICAL CARE IF:   You develop a rash.  You have difficulty breathing.  You develop any reaction or side effects to medications given. Document Released: 06/27/2005 Document Revised: 03/01/2012 Document Reviewed: 12/05/2008 [facility] Patient Information 2015 [place], [place]. This information is not intended to replace advice given to you by your health care provider. Make sure you discuss any questions you have with your health care provider. Conscious Sedation Sedation is the use of medicines to promote relaxation and relieve discomfort and anxiety. Conscious sedation is a type of sedation. Under conscious sedation you are less alert than normal but are still able to respond to instructions or stimulation. Conscious sedation is used during short medical and dental procedures. It is milder than deep sedation or general anesthesia and allows you to return to your regular activities sooner.  LET [facility] CARE PROVIDER KNOW ABOUT:   Any allergies you have.  All medicines you are taking, including vitamins, herbs, eye drops, creams, and over-the-counter medicines.  Use of steroids (by mouth or creams).  Previous problems you or members of your family have had with the use of anesthetics.  Any blood disorders you have.  Previous surgeries you have had.  Medical conditions you have.  Possibility of pregnancy, if this applies.  Use of cigarettes, alcohol,  or illegal drugs. RISKS AND COMPLICATIONS Generally, this is a safe procedure. However, as with any procedure, problems can occur. Possible problems include:  Oversedation.  Trouble breathing on your own. You may need to have a breathing tube until you are awake and breathing on your own.  Allergic reaction to any of the medicines  used for the procedure. BEFORE THE PROCEDURE  You may have blood tests done. These tests can help show how well your kidneys and liver are working. They can also show how well your blood clots.  A physical exam will be done.  Only take medicines as directed by your health care provider. You may need to stop taking medicines (such as blood thinners, aspirin, or nonsteroidal anti-inflammatory drugs) before the procedure.   Do not eat or drink at least 6 hours before the procedure or as directed by your health care provider.  Arrange for a responsible adult, family member, or friend to take you home after the procedure. He or she should stay with you for at least 24 hours after the procedure, until the medicine has worn off. PROCEDURE   An intravenous (IV) catheter will be inserted into one of your veins. Medicine will be able to flow directly into your body through this catheter. You may be given medicine through this tube to help prevent pain and help you relax.  The medical or dental procedure will be done. AFTER THE PROCEDURE  You will stay in a recovery area until the medicine has worn off. Your blood pressure and pulse will be checked.   Depending on the procedure you had, you may be allowed to go home when you can tolerate liquids and your pain is under control. Document Released: 09/02/2001 Document Revised: 12/13/2013 Document Reviewed: 08/15/2013 [facility] Patient Information 2015 [place], [place]. This information is not intended to replace advice given to you by your health care provider. Make sure you discuss any questions you have with your health care provider.

## 2015-02-27 NOTE — Procedures (Signed)
BM Bx R iliac bone No comp

## 2015-02-28 ENCOUNTER — Ambulatory Visit (HOSPITAL_BASED_OUTPATIENT_CLINIC_OR_DEPARTMENT_OTHER): Payer: BC Managed Care – PPO | Admitting: Nurse Practitioner

## 2015-02-28 ENCOUNTER — Telehealth: Payer: Self-pay | Admitting: *Deleted

## 2015-02-28 ENCOUNTER — Encounter: Payer: Self-pay | Admitting: Nurse Practitioner

## 2015-02-28 ENCOUNTER — Ambulatory Visit (HOSPITAL_BASED_OUTPATIENT_CLINIC_OR_DEPARTMENT_OTHER): Payer: BC Managed Care – PPO

## 2015-02-28 ENCOUNTER — Other Ambulatory Visit: Payer: BC Managed Care – PPO

## 2015-02-28 VITALS — BP 137/73 | HR 68 | Temp 98.0°F | Resp 18 | Wt 221.7 lb

## 2015-02-28 DIAGNOSIS — I10 Essential (primary) hypertension: Secondary | ICD-10-CM

## 2015-02-28 DIAGNOSIS — C9110 Chronic lymphocytic leukemia of B-cell type not having achieved remission: Secondary | ICD-10-CM

## 2015-02-28 DIAGNOSIS — D89811 Chronic graft-versus-host disease: Secondary | ICD-10-CM

## 2015-02-28 DIAGNOSIS — D801 Nonfamilial hypogammaglobulinemia: Secondary | ICD-10-CM

## 2015-02-28 DIAGNOSIS — E2740 Unspecified adrenocortical insufficiency: Secondary | ICD-10-CM

## 2015-02-28 DIAGNOSIS — M25559 Pain in unspecified hip: Secondary | ICD-10-CM

## 2015-02-28 DIAGNOSIS — T8609 Other complications of bone marrow transplant: Principal | ICD-10-CM

## 2015-02-28 DIAGNOSIS — M818 Other osteoporosis without current pathological fracture: Secondary | ICD-10-CM

## 2015-02-28 DIAGNOSIS — E860 Dehydration: Secondary | ICD-10-CM

## 2015-02-28 DIAGNOSIS — R197 Diarrhea, unspecified: Secondary | ICD-10-CM

## 2015-02-28 DIAGNOSIS — E291 Testicular hypofunction: Secondary | ICD-10-CM

## 2015-02-28 DIAGNOSIS — E46 Unspecified protein-calorie malnutrition: Secondary | ICD-10-CM

## 2015-02-28 LAB — BONE MARROW EXAM: Bone Marrow Exam: 164

## 2015-02-28 MED ORDER — PROMETHAZINE HCL 25 MG/ML IJ SOLN: 12.5000 mg | Freq: Once | INTRAMUSCULAR | Status: DC

## 2015-02-28 MED ORDER — SODIUM CHLORIDE 0.9 % IV SOLN
1.0000 g | Freq: Once | INTRAVENOUS | Status: AC
  Administered 2015-02-28: 1 g via INTRAVENOUS
  Filled 2015-02-28: qty 2

## 2015-02-28 MED ORDER — HEPARIN SOD (PORK) LOCK FLUSH 100 UNIT/ML IV SOLN
500.0000 [IU] | Freq: Once | INTRAVENOUS | Status: AC
  Administered 2015-02-28: 500 [IU] via INTRAVENOUS
  Filled 2015-02-28: qty 5

## 2015-02-28 MED ORDER — ONDANSETRON 8 MG/50ML IVPB (CHCC): 8.0000 mg | Freq: Once | INTRAVENOUS | Status: DC

## 2015-02-28 MED ORDER — SODIUM CHLORIDE 0.9 % IJ SOLN
10.0000 mL | Freq: Once | INTRAMUSCULAR | Status: AC
  Administered 2015-02-28: 10 mL via INTRAVENOUS
  Filled 2015-02-28: qty 10

## 2015-02-28 MED ORDER — HEPARIN SOD (PORK) LOCK FLUSH 100 UNIT/ML IV SOLN
500.0000 [IU] | Freq: Once | INTRAVENOUS | Status: DC | PRN
  Filled 2015-02-28: qty 5

## 2015-02-28 NOTE — Patient Instructions (Addendum)
Hypomagnesemia Magnesium is a common ion (mineral) in the body which is needed for metabolism. It is about how the body handles food and other chemical reactions necessary for life. Only about 2% of the magnesium in our body is found in the blood. When this is low, it is called hypomagnesemia. The blood will measure only a tiny amount of the magnesium in our body. When it is low in our blood, it does not mean that the whole body supply is low. The normal serum concentration ranges from 1.8-2.5 mEq/L. When the level gets to be less than 1.0 mEq/L, a number of problems begin to happen.  CAUSES   Receiving intravenous fluids without magnesium replacement.  Loss of magnesium from the bowel by nasogastric suction.  Loss of magnesium from nausea and vomiting or severe diarrhea. Any of the inflammatory bowel conditions can cause this.  Abuse of alcohol often leads to low serum magnesium.  An inherited form of magnesium loss happens when the kidneys lose magnesium. This is called familial or primary hypomagnesemia.  Some medications such as diuretics also cause the loss of magnesium. SYMPTOMS  These following problems are worse if the changes in magnesium levels come on suddenly.  Tremor.  Confusion.  Muscle weakness.  Oversensitive to sights and sounds.  Sensitive reflexes.  Depression.  Muscular fibrillations.  Overreactivity of the nerves.  Irritability.  Psychosis.  Spasms of the hand muscles.  Tetany (where the muscles go into uncontrollable spasms). DIAGNOSIS  This condition can be diagnosed by blood tests. TREATMENT   In an emergency, magnesium can be given intravenously (by vein).  If the condition is less worrisome, it can be corrected by diet. High levels of magnesium are found in green leafy vegetables, peas, beans, and nuts among other things. It can also be given through medications by mouth.  If it is being caused by medications, changes can be made.  If  alcohol is a problem, help is available if there are difficulties giving it up. Document Released: 09/03/2005 Document Revised: 04/24/2014 Document Reviewed: 07/28/2008 [employer] Patient Information 2015 [employer]. This information is not intended to replace advice given to you by your health care provider. Make sure you discuss any questions you have with your health care provider.  

## 2015-02-28 NOTE — Progress Notes (Signed)
ID: Timothy [name]   DOB: 03-Feb-1946  MR#: [id number]  MBT#:[id number]  TXM:IWOE, Timothy Saxon, MD SU: OTHER MD: [name]; [name], MD; [name], [name],MD; [name], MD; [facility] in [place], [place]:  Timothy Duval, RN [phone]), Timothy [name] M.D.  CHIEF COMPLAINT:  CLL, status post allogeneic stem cell transplant, GVHD CURRENT TREATMENT: Immunosuppression  HISTORY OF CLL: From the original intake note:  We have very complete records from Dr. Racheal Patches in [place], and in summary:  The patient was initially diagnosed in August 2000, with a white cell count of 23,600, but normal hemoglobin and platelets, and no significant symptomatology. Over the next several years his white cell count drifted up, and he eventually developed some symptoms of night sweats in particular, leading to treatment with fludarabine, Cytoxan and rituxan for five cycles given between December 2006 and May 2007.  We have CT scans from June 2006, November 2006 and April 2007, and comparing the November 2006 and April 2007 scans, there was near complete response. He had subsequent therapy in [place] as detailed below, but with decreased response, leading to allogeneic stem-cell transplant at the [facility] 02/24/2012.  Subsequent history is as detailed below.  INTERVAL HISTORY: Anquan returns today for follow up of his chronic lymphoid leukemia and graft-versus-host disease accompanied his wife, Timothy [name]. He is 1 day status post a bone marrow biospy. He states it well with with no complications and very little pain. He is back down to 16m prednisone daily, and unfortunately his bowel movements have picked up and are looser. He continues to use questran powder 2-3 times daily along with imodium PRN. His blood sugars are better controlled.   REVIEW OF SYSTEMS: TMollydenies fevers, chills, nausea, or vomiting. He is eating well and making an  effort to stay better hydrated. The steroids have made him weak, but no episodes as bad as the fall documented at his labs visit. He denies shortness of breath, chest pain, cough, or palpitations. He does not sleep well at night because he wakes to use the bathroom. A detailed review of systems is otherwise stable.  PAST MEDICAL HISTORY: Past Medical History  Diagnosis Date  . 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 Diverticular disease   . Hyperlipidemia   . Obesity   . Hypertension   . Hiatal hernia   . CMV (cytomegalovirus) antibody positive     pre-transplant, with seroconversion x2 pst-transplant  . Right bundle branch block     pre-transplant  . CKD (chronic kidney disease) stage 2, GFR 60-89 ml/min   . Pancytopenia   . Atrial fibrillation     post-transplant  . Myopathy   . 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 Diarrhea in adult patient 12/05/2012    Due to active GVHD  . Rash of face 12/05/2012    Due to GVHD  . Hypomagnesemia 01/26/2013  . Left hip pain 12/01/2013  . Steroid-induced diabetes     novalog  . Leukemia, chronic lymphoid   . CLL (chronic lymphocytic leukemia) 12/05/2012    Dx 07/1999; started Rx 12/06  AlloBMT 3/13    PAST SURGICAL HISTORY: Past Surgical History  Procedure Laterality Date  . Tonsillectomy and adenoidectomy    . Bone marrow transplant    . Flexible sigmoidoscopy  11/17/2012    Procedure: FLEXIBLE  SIGMOIDOSCOPY;  Surgeon: Jeryl Columbia, MD;  Location: [facility];  Service: Endoscopy;  Laterality: N/A;  Dr Watt Climes states will be admitted to rooom 1339 11/16/12  . Esophagogastroduodenoscopy  11/17/2012    Procedure: ESOPHAGOGASTRODUODENOSCOPY (EGD);  Surgeon: Jeryl Columbia, MD;  Location: [facility];  Service: Endoscopy;  Laterality: N/A;    FAMILY HISTORY Family History  Problem Relation  Age of Onset  . Cancer Father   The patient's father died from complications of chronic lymphocytic leukemia at the age of 33.  It had been diagnosed seven years before when he was 58.  The patient is enrolled in a familial chronic lymphocytic leukemia study out of the [employer].  The patient's mother is 22, alive, unfortunately suffering with dementia, and he has a brother, 68, who is otherwise in fair health.   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  His wife of >40 years, Timothy [name], is a homemaker.  Their daughter, [name], lives in [place].  She also is a Agricultural engineer.  The patient has an 38 year old grandson and an 68-year-old granddaughter, and that is really the main reason he moved to this area.  He is a Tourist information centre manager.     ADVANCED DIRECTIVES: In place  HEALTH MAINTENANCE: (Updated 04/13/2014) History  Substance Use Topics  . Smoking status: Never Smoker   . Smokeless tobacco: Never Used  . Alcohol Use: No     Colonoscopy: Nov 2013, Dr. Watt Climes  PSA: Not on file  Bone density:  Feb 2014;  Patient also has known insufficiency and pathologic fractures in addition to his long-standing history of steroid use.  Lipid panel: Jan 2015, elevated    Allergies  Allergen Reactions  . Benadryl [Diphenhydramine Hcl]     "Restless leg syndrome"    Current Outpatient Prescriptions  Medication Sig Dispense Refill  . acyclovir (ZOVIRAX) 400 MG tablet TAKE 2 TABLETS BY MOUTH TWICE DAILY 120 tablet 1  . budesonide (ENTOCORT EC) 3 MG 24 hr capsule Take 1 capsule (3 mg total) by mouth 3 (three) times daily. 90 capsule 6  . CARTIA XT 240 MG 24 hr capsule TAKE 1 CAPSULE BY MOUTH ONCE DAILY (Patient taking differently: take 1 capsule at bedtime) 30 capsule 3  .  cholestyramine (QUESTRAN) 4 G packet Take 1 packet (4 g total) by mouth 2 (two) times daily. 60 each 12  . fluconazole (DIFLUCAN) 100 MG tablet Take 1 tablet (100 mg total) by mouth once. (Patient taking differently: Take 100 mg by mouth daily. ) 30 tablet 3  . furosemide (LASIX) 20 MG tablet Take 1 tablet (20 mg total) by mouth daily. 30 tablet 1  . glucose blood (ONE TOUCH ULTRA TEST) test strip Test before meals and at bedtime. 300 each 2  . insulin aspart (NOVOLOG) 100 unit/mL injection Inject 10 Units into the skin 3 (three) times daily before meals.    . Insulin Detemir (LEVEMIR) 100 UNIT/ML Pen Inject 47 Units into the skin daily.     . Insulin Pen Needle (B-D UF III MINI PEN NEEDLES) 31G X 5 MM MISC Use twice daily with insulin as directed 100 each 5  . labetalol (NORMODYNE) 200 MG tablet Take 2 tablets (400 mg total) by mouth 2 (two) times daily. 120 tablet 1  . Lidocaine-Hydrocortisone Ace 3-0.5 %  KIT Apply 1 application topically as needed (for pain). 1 each 3  . lidocaine-prilocaine (EMLA) cream Apply 1 application topically as needed. 1-2 hrs before each port access 30 g 6  . lisinopril (PRINIVIL,ZESTRIL) 10 MG tablet Take 2.5 mg by mouth daily.    Marland Kitchen loratadine (CLARITIN) 10 MG tablet Take 10 mg by mouth daily.    Marland Kitchen omeprazole (PRILOSEC) 20 MG capsule Take 1 capsule (20 mg total) by mouth daily. 30 capsule 5  . ONETOUCH DELICA LANCETS 70Y MISC TEST BEFORE MEALS AND AT BEDTIME. 300 each 0  . predniSONE (DELTASONE) 10 MG tablet Take 84m in the morning with breakfast (Patient taking differently: Take 10 mg by mouth daily with breakfast. ) 30 tablet 1  . sertraline (ZOLOFT) 50 MG tablet ALTERNATE TAKING 1 TABLET DAILY AND 2 TABLETS DAILY (Patient taking differently: Take 50 mg by mouth every morning. ) 90 tablet 5  . sodium bicarbonate 325 MG tablet Take 650 mg by mouth 2 (two) times daily.     .Marland Kitchensulfamethoxazole-trimethoprim (BACTRIM DS,SEPTRA DS) 800-160 MG per tablet Take 1 tablet by  mouth daily. 30 tablet 5  . tacrolimus (PROGRAF) 0.5 MG capsule Take 3 capsules (1.5 mg total) by mouth 2 (two) times daily. 180 capsule 4  . VOLTAREN 1 % GEL Apply 2 g topically 2 (two) times daily. Applied to back    . [DISCONTINUED] insulin aspart (NOVOLOG FLEXPEN) 100 UNIT/ML SOPN FlexPen 18units sq qam, 9units sq qpm, or as directed 15 mL 1   No current facility-administered medications for this visit.   Facility-Administered Medications Ordered in Other Visits  Medication Dose Route Frequency Provider Last Rate Last Dose  . 0.9 %  sodium chloride infusion   Intravenous Continuous Amy G Berry, PA-C 500 mL/hr at 03/12/13 0900    . heparin lock flush 100 unit/mL  500 Units Intracatheter Once PRN GChauncey Cruel MD      . heparin lock flush 100 unit/mL  500 Units Intravenous Once NHeath Lark MD      . sodium chloride 0.9 % injection 10 mL  10 mL Intravenous PRN GChauncey Cruel MD   10 mL at 08/11/12 1606  . sodium chloride 0.9 % injection 10 mL  10 mL Intravenous Once NHeath Lark MD        OBJECTIVE: Middle-aged white man who appears stated age  F82Vitals:   02/28/15 1145  BP: 137/73  Pulse: 68  Temp: 98 F (36.7 C)  Resp: 18  Body mass index is 35.8 kg/(m^2).  ECOG: 2 Filed Weights   02/28/15 1145  Weight: 221 lb 11.2 oz (100.562 kg)   Skin: warm, dry, diffuse bruises to his bilateral arms HEENT: sclerae anicteric, conjunctivae pink, oropharynx clear. No thrush or mucositis.  Lymph Nodes: No cervical or supraclavicular lymphadenopathy  Lungs: clear to auscultation bilaterally, no rales, wheezes, or rhonci  Heart: regular rate and rhythm  Abdomen: round, soft, non tender, positive bowel sounds  Musculoskeletal: No focal spinal tenderness, +2 bilateral lower extremity edema Neuro: non focal, well oriented, positive affect   LABS:  CBC    Component Value Date/Time   WBC 3.2* 02/27/2015 0805   WBC 3.2* 02/21/2015 0938   RBC 3.64* 02/27/2015 0805   RBC 4.03*  02/21/2015 0938   RBC 3.82* 03/16/2013 1400   HGB 11.3* 02/27/2015 0805   HGB 12.5* 02/21/2015 0938   HCT 34.1* 02/27/2015 0805   HCT 37.6* 02/21/2015 0938   PLT 120* 02/27/2015 0805  PLT 110* 02/21/2015 0938   MCV 93.7 02/27/2015 0805   MCV 93.3 02/21/2015 0938   MCH 31.0 02/27/2015 0805   MCH 31.0 02/21/2015 0938   MCHC 33.1 02/27/2015 0805   MCHC 33.2 02/21/2015 0938   RDW 16.2* 02/27/2015 0805   RDW 16.1* 02/21/2015 0938   LYMPHSABS 1.3 02/21/2015 0938   LYMPHSABS 1.4 03/18/2013 0615   MONOABS 0.3 02/21/2015 0938   MONOABS 0.3 03/18/2013 0615   EOSABS 0.0 02/21/2015 0938   EOSABS 0.0 03/18/2013 0615   BASOSABS 0.0 02/21/2015 0938   BASOSABS 0.0 03/18/2013 0615       Chemistry      Component Value Date/Time   NA 137 02/27/2015 0805   NA 141 02/21/2015 0939   K 4.8 02/27/2015 0805   K 5.4* 02/21/2015 0939   CL 117* 02/27/2015 0805   CL 101 06/15/2013 1034   CO2 18* 02/27/2015 0805   CO2 14* 02/21/2015 0939   BUN 40* 02/27/2015 0805   BUN 51.5* 02/21/2015 0939   CREATININE 1.44* 02/27/2015 0805   CREATININE 1.5* 02/21/2015 0939      Component Value Date/Time   CALCIUM 8.2* 02/27/2015 0805   CALCIUM 8.4 02/21/2015 0939   ALKPHOS 91 02/21/2015 0939   ALKPHOS 80 09/07/2014 1324   AST 30 02/21/2015 0939   AST 40* 09/07/2014 1324   ALT 54 02/21/2015 0939   ALT 55* 09/07/2014 1324   BILITOT 0.32 02/21/2015 0939   BILITOT <0.2* 09/07/2014 1324     STUDIES: Dg Thoracic Spine 2 View  02/15/2015   CLINICAL DATA:  Fall 2 days ago at home, upper and lower back pain, history of leukemia  EXAM: THORACIC SPINE - 2 VIEW  COMPARISON:  08/09/2014  FINDINGS: Three views of thoracic spine submitted. No acute fracture or subluxation. Right IJ Port-A-Cath with tip in SVC. Stable compression deformities mid and lower thoracic spine. Alignment is preserved. Degenerative changes mid and lower thoracic spine.  IMPRESSION: No acute fracture or subluxation. Stable chronic compression  deformities mid and lower thoracic spine.   Electronically Signed   By: Lahoma Crocker M.D.   On: 02/15/2015 15:30   Dg Lumbar Spine 2-3 Views  02/15/2015   CLINICAL DATA:  Fall 2 days ago at home, back pain, thoracic pain  EXAM: LUMBAR SPINE - 2-3 VIEW  COMPARISON:  Thoracic spine MRI 8/19/ 15  FINDINGS: Two views of lumbar spine submitted. No acute fracture or subluxation. Stable compression deformities T11 and T12 vertebral bodies. Again noted disc space flattening at T12-L1 level. Multilevel moderate anterior spurring. There is mild compression deformity and prior vertebroplasty at L3 and L4 level. Facet degenerative changes are noted L4 and L5 level.  IMPRESSION: No acute fracture or subluxation. Prior vertebroplasty and mild compression deformities L3 and L4 vertebral bodies. Multilevel degenerative changes. Stable compression deformities of T12 and T11 vertebral bodies.   Electronically Signed   By: Lahoma Crocker M.D.   On: 02/15/2015 15:32   Ct Chest W Contrast  02/19/2015   CLINICAL DATA:  Restaging chronic lymphocytic leukemia status post bone marrow transplant in May 2013. Subsequent encounter.  EXAM: CT CHEST, ABDOMEN, AND PELVIS WITH CONTRAST  TECHNIQUE: Multidetector CT imaging of the chest, abdomen and pelvis was performed following the standard protocol during bolus administration of intravenous contrast.  CONTRAST:  132m OMNIPAQUE IOHEXOL 300 MG/ML  SOLN  COMPARISON:  Thoracic MRI 08/09/2014.  CTs 02/21/2014.  FINDINGS: CT CHEST FINDINGS  Mediastinum/Nodes: There are no enlarged mediastinal, hilar or  axillary lymph nodes. The thyroid gland, trachea and esophagus demonstrate no significant findings. A small hiatal hernia appears unchanged. The heart size is normal. There is no pleural or pericardial effusion.Right IJ Port-A-Cath tip is at the SVC right atrial junction. There is stable mild atherosclerosis of the aorta, great vessels and coronary arteries.  Lungs/Pleura: There is no pleural  effusion.There is mild linear scarring or atelectasis at both lung bases which is similar to the prior examination. The lungs are otherwise clear.  Musculoskeletal/Chest wall: Old rib fractures are present bilaterally. There are several chronic thoracic compression deformities. There is a new nearly horizontal fracture through the T10 vertebral body, most apparent on the reformatted images. This is associated with a small amount of paraspinal hemorrhage and appears acute/subacute. There is no osseous retropulsion or malalignment. No underlying lytic lesion identified.  CT ABDOMEN AND PELVIS FINDINGS  Hepatobiliary: Stable low-density lesion in the lateral segment left hepatic lobe. The liver otherwise appears unremarkable. No evidence of gallstones, gallbladder wall thickening or biliary dilatation.  Pancreas: Mild pancreatic atrophy. No focal abnormality, surrounding inflammatory change or pancreatic ductal dilatation.  Spleen: Normal in size without focal abnormality.  Adrenals/Urinary Tract: Both adrenal glands appear normal.The kidneys appear normal without evidence of urinary tract calculus, suspicious lesion or hydronephrosis. No bladder abnormalities are seen. The bladder is incompletely distended.  Stomach/Bowel: No evidence of bowel wall thickening, distention or surrounding inflammatory change.There are chronic diverticular changes of the distal colon.  Vascular/Lymphatic: There are no enlarged abdominal or pelvic lymph nodes. Stable aortoiliac atherosclerosis and tortuosity.  Reproductive: Stable mild enlargement of the prostate gland.  Other: Stable 4.5 cm umbilical hernia containing only fat. There is stable fat within both inguinal canals.  Musculoskeletal: Irregular sclerosis is again noted throughout the sacrum bilaterally, likely due to chronic insufficiency fractures. No acute fractures are demonstrated. Multiple lumbar compression deformities are stable status post augmentation at L3 and L4. A  chronic lucent lesion in the right iliac bone is unchanged.  IMPRESSION: 1. No evidence of recurrent lymphadenopathy or splenomegaly to suggest recurrent chronic lymphocytic leukemia. 2. New nearly horizontal fracture through the T10 vertebral body, likely an insufficiency fracture. 3. No other acute fractures identified. There are multiple chronic fractures in the thoracolumbar spine, sacrum and bilateral ribs. 4. Stable incidental findings including distal colonic diverticulosis and an umbilical hernia containing only fat. 5. These results will be called to the ordering clinician or representative by the Radiologist Assistant, and communication documented in the PACS or zVision Dashboard.   Electronically Signed   By: Richardean Sale M.D.   On: 02/19/2015 09:24   Ct Abdomen Pelvis W Contrast  02/19/2015   CLINICAL DATA:  Restaging chronic lymphocytic leukemia status post bone marrow transplant in May 2013. Subsequent encounter.  EXAM: CT CHEST, ABDOMEN, AND PELVIS WITH CONTRAST  TECHNIQUE: Multidetector CT imaging of the chest, abdomen and pelvis was performed following the standard protocol during bolus administration of intravenous contrast.  CONTRAST:  118m OMNIPAQUE IOHEXOL 300 MG/ML  SOLN  COMPARISON:  Thoracic MRI 08/09/2014.  CTs 02/21/2014.  FINDINGS: CT CHEST FINDINGS  Mediastinum/Nodes: There are no enlarged mediastinal, hilar or axillary lymph nodes. The thyroid gland, trachea and esophagus demonstrate no significant findings. A small hiatal hernia appears unchanged. The heart size is normal. There is no pleural or pericardial effusion.Right IJ Port-A-Cath tip is at the SVC right atrial junction. There is stable mild atherosclerosis of the aorta, great vessels and coronary arteries.  Lungs/Pleura: There is no pleural effusion.There  is mild linear scarring or atelectasis at both lung bases which is similar to the prior examination. The lungs are otherwise clear.  Musculoskeletal/Chest wall: Old rib  fractures are present bilaterally. There are several chronic thoracic compression deformities. There is a new nearly horizontal fracture through the T10 vertebral body, most apparent on the reformatted images. This is associated with a small amount of paraspinal hemorrhage and appears acute/subacute. There is no osseous retropulsion or malalignment. No underlying lytic lesion identified.  CT ABDOMEN AND PELVIS FINDINGS  Hepatobiliary: Stable low-density lesion in the lateral segment left hepatic lobe. The liver otherwise appears unremarkable. No evidence of gallstones, gallbladder wall thickening or biliary dilatation.  Pancreas: Mild pancreatic atrophy. No focal abnormality, surrounding inflammatory change or pancreatic ductal dilatation.  Spleen: Normal in size without focal abnormality.  Adrenals/Urinary Tract: Both adrenal glands appear normal.The kidneys appear normal without evidence of urinary tract calculus, suspicious lesion or hydronephrosis. No bladder abnormalities are seen. The bladder is incompletely distended.  Stomach/Bowel: No evidence of bowel wall thickening, distention or surrounding inflammatory change.There are chronic diverticular changes of the distal colon.  Vascular/Lymphatic: There are no enlarged abdominal or pelvic lymph nodes. Stable aortoiliac atherosclerosis and tortuosity.  Reproductive: Stable mild enlargement of the prostate gland.  Other: Stable 4.5 cm umbilical hernia containing only fat. There is stable fat within both inguinal canals.  Musculoskeletal: Irregular sclerosis is again noted throughout the sacrum bilaterally, likely due to chronic insufficiency fractures. No acute fractures are demonstrated. Multiple lumbar compression deformities are stable status post augmentation at L3 and L4. A chronic lucent lesion in the right iliac bone is unchanged.  IMPRESSION: 1. No evidence of recurrent lymphadenopathy or splenomegaly to suggest recurrent chronic lymphocytic leukemia. 2.  New nearly horizontal fracture through the T10 vertebral body, likely an insufficiency fracture. 3. No other acute fractures identified. There are multiple chronic fractures in the thoracolumbar spine, sacrum and bilateral ribs. 4. Stable incidental findings including distal colonic diverticulosis and an umbilical hernia containing only fat. 5. These results will be called to the ordering clinician or representative by the Radiologist Assistant, and communication documented in the PACS or zVision Dashboard.   Electronically Signed   By: William  Veazey M.D.   On: 02/19/2015 09:24   Ct Biopsy  02/27/2015   CLINICAL DATA:  Leukemia  EXAM: CT-GUIDED BONE MARROW ASPIRATE AND CORE BIOPSY.  MEDICATIONS AND MEDICAL HISTORY: Versed 0 mg, Fentanyl 100 mcg.  Additional Medications: None.  ANESTHESIA/SEDATION: Moderate sedation time: 15 minutes  PROCEDURE: The procedure, risks, benefits, and alternatives were explained to the patient. Questions regarding the procedure were encouraged and answered. The patient understands and consents to the procedure.  The back was prepped with Betadine in a sterile fashion, and a sterile drape was applied covering the operative field. A sterile gown and sterile gloves were used for the procedure.  Under CT guidance, an 11 gauge needle was inserted into the right iliac bone and aspirates were obtained. A core was obtained. Final imaging was performed.  Patient tolerated the procedure well without complication. Vital sign monitoring by nursing staff during the procedure will continue as patient is in the special procedures unit for post procedure observation.  FINDINGS: The images document guide needle placement within the right iliac bone via posterior approach. Post biopsy images demonstrate no hemorrhage.  COMPLICATIONS: None.  IMPRESSION: Successful CT-guided bone marrow aspirate and core.   Electronically Signed   By: Arthur  Hoss M.D.   On: 02/27/2015 12:48       ASSESSMENT: 69 y.o.  [place] man with a history of well-differentiated lymphocytic lymphoma/ chronic lymphoid leukemia initially diagnosed in 2000, not requiring intervention until 2006; with multiple chromosomal abnormalities.  His treatment history is as follows:  (1) fludarabine/cyclophosphamide/rituximab x5 completed May 2007.   (2) rituximab for 8 doses October 2010, with partial response   (3) Leustatin and ofatumumab weekly x8 July to September 2011 followed by maintenance ofatumumab  every 2 months, with initial response but rising counts September 2012   (4) status-post unrelated donor stem-cell transplant 02/24/2012 at [facility]  (a) conditioning regimen consisted of fludarabine + TBI at 200 cGy, followed by rituximab x27;  (b) CMV reactivation x3 (patient CMV positive, donor negative), s/p ganciclovir treatment; 3d reactivation August 2013, s/p gancyclovir, with negative PCR mid-September 2013; last gancyclovir dose 10/06/2012 (c) Chronic GVHD: involving gut and skin, treated with steroids, tacrolimus and MMF.  MMF was eventually d/c'd and tacrolimus currently at a dose of 1.73m BID (d) atrial fibrillation: resolved on brief amiodarone regimen (e) steroid-induced myopathy: improving  (f) hypomagnesemia: improved after d/c gancyclovir, needs continuing support (g) hypogammaglobulinemia: requiring IVIG most recently 08/03/2014. (h) history of elevated triglycerides (606 on 07/14/2012)  (i) adrenal insufficiency: on prednisone and budesonide (j) pancytopenia,resolved (k) brief episode of neutropenia ([id number]) February 2015, accompanied by diarrhea; resolved   (5) restaging studies February-March 2015 including CT scans, flow cytometry, and bone marrow biopsy, showed no evidence of residual chronic lymphoid leukemia.  (6) recurrent GVHD (skin rash, mouth changes, severe diarrhea and gastric/duodenal/colonic biopsies 11/17/2012 c/w GVHD grade 2) : now grade 1 to inactive  (7)  malnutrition -- on  VITAL supplement in addition to regular diet; on Marinol for anorexia  (8) testosterone deficiency--on patch   (9) deconditioning: Especially quad weakness; continuing rehabilitation exercises  (10) mild dehydration: encouraged increased po fluids; receives IVF support w magnesium weekly  (11) severe steroid-induced osteoporosis with compression fractures: received pamidronate 12/18/2012. Status post kyphoplasty at L3-4 in June 2014. Also with evidence of rib fractures and insufficiency fractures bilaterally of the sacral  alae, noted by CT in March 2015. --   Denosumab started 12/08/2013, given as prolia Q6 months which is what has been approved by his insurance, most recent dose 10/02/2014  (12) chronic back pain and hip pain controlled with OxyContin and hydrocodone/APAP.  (12) nausea: well controlled on current meds  (13)  Positive c.diff, 03/08/2013, on Flagyl 500 mg TID x 20 days, then on oral vanco with Questran, showing improvement; positive when repeated April 2014; Negative x 3 since then  (14) persistently increased BUN and potassium  (15)  Hypertension, on labetalol, cardizem, lisinopril, and furosemide; managed by Dr. SBrigitte Pulse (16) steroid induced hyperglycemia, on sterlix and 70/30 insulin, followed by Dr. ELoanne Drillingand Dr. SBrigitte Pulse (17) hypogammaglobulinemia-- requiring intermittent supplementation, most recent dose 10/19/2014  (18) squamous cell CA in situ removed from left parietal scalp October 2014, second lesion to be removed may 2015   PLAN: TBrennenis doing moderately well today. The labs were reviewed in detail and were stable. His potassium is back down to normal and his creatinine is just slightly down to 1.4. He will proceed with IV fluids plus magnesium today as planned. Though his diarrhea has picked up since dropping his prednisone dose back to 143m However, we plan to make no changes to his prednisone or tacrolimus dose. He does not want to make his swelling worse. I  have advised that he avoid salt and elevate his legs.  He was uninterested in being fit for compression stockings.   Dravin will continue with his fluids with magnesium weekly and his visit every 2 weeks. He understands and agrees with this plan. He has has been encouraged to call with any issues that might arise before his next visit here.   F , NP 02/28/2015   

## 2015-02-28 NOTE — Telephone Encounter (Signed)
Per staff message and POF I have scheduled appts. Advised scheduler of appts. JMW  

## 2015-03-02 NOTE — Telephone Encounter (Signed)
error 

## 2015-03-05 ENCOUNTER — Other Ambulatory Visit: Payer: Self-pay | Admitting: Emergency Medicine

## 2015-03-05 DIAGNOSIS — Z9489 Other transplanted organ and tissue status: Secondary | ICD-10-CM

## 2015-03-05 DIAGNOSIS — C9110 Chronic lymphocytic leukemia of B-cell type not having achieved remission: Secondary | ICD-10-CM

## 2015-03-07 ENCOUNTER — Ambulatory Visit (HOSPITAL_BASED_OUTPATIENT_CLINIC_OR_DEPARTMENT_OTHER): Payer: BC Managed Care – PPO

## 2015-03-07 ENCOUNTER — Other Ambulatory Visit (HOSPITAL_COMMUNITY)
Admission: RE | Admit: 2015-03-07 | Discharge: 2015-03-07 | Disposition: A | Discharge status: Home / Self Care | Payer: BC Managed Care – PPO | Source: Ambulatory Visit | Attending: Oncology | Admitting: Oncology

## 2015-03-07 ENCOUNTER — Ambulatory Visit (HOSPITAL_BASED_OUTPATIENT_CLINIC_OR_DEPARTMENT_OTHER): Payer: BC Managed Care – PPO | Admitting: Nurse Practitioner

## 2015-03-07 ENCOUNTER — Other Ambulatory Visit (HOSPITAL_BASED_OUTPATIENT_CLINIC_OR_DEPARTMENT_OTHER): Payer: BC Managed Care – PPO

## 2015-03-07 DIAGNOSIS — C9110 Chronic lymphocytic leukemia of B-cell type not having achieved remission: Secondary | ICD-10-CM | POA: Diagnosis present

## 2015-03-07 DIAGNOSIS — R197 Diarrhea, unspecified: Secondary | ICD-10-CM | POA: Diagnosis not present

## 2015-03-07 DIAGNOSIS — R609 Edema, unspecified: Secondary | ICD-10-CM | POA: Diagnosis not present

## 2015-03-07 DIAGNOSIS — Z9489 Other transplanted organ and tissue status: Secondary | ICD-10-CM

## 2015-03-07 LAB — COMPREHENSIVE METABOLIC PANEL (CC13)
ALT: 27 U/L (ref 0–55)
ANION GAP: 10 meq/L (ref 3–11)
AST: 24 U/L (ref 5–34)
Albumin: 3.2 g/dL — ABNORMAL LOW (ref 3.5–5.0)
Alkaline Phosphatase: 140 U/L (ref 40–150)
BUN: 45.7 mg/dL — ABNORMAL HIGH (ref 7.0–26.0)
CHLORIDE: 114 meq/L — AB (ref 98–109)
CO2: 16 meq/L — AB (ref 22–29)
Calcium: 8.1 mg/dL — ABNORMAL LOW (ref 8.4–10.4)
Creatinine: 1.2 mg/dL (ref 0.7–1.3)
EGFR: 59 mL/min/{1.73_m2} — ABNORMAL LOW (ref >90)
GLUCOSE: 166 mg/dL — AB (ref 70–140)
POTASSIUM: 5.1 meq/L (ref 3.5–5.1)
Sodium: 140 mEq/L (ref 136–145)
Total Bilirubin: 0.32 mg/dL (ref 0.20–1.20)
Total Protein: 5.1 g/dL — ABNORMAL LOW (ref 6.4–8.3)

## 2015-03-07 LAB — CBC WITH DIFFERENTIAL/PLATELET
BASO%: 0.5 % (ref 0.0–2.0)
Basophils Absolute: 0 10*3/uL (ref 0.0–0.1)
EOS ABS: 0 10*3/uL (ref 0.0–0.5)
EOS%: 0.8 % (ref 0.0–7.0)
HEMATOCRIT: 38.3 % — AB (ref 38.4–49.9)
HEMOGLOBIN: 12.1 g/dL — AB (ref 13.0–17.1)
LYMPH#: 1.4 10*3/uL (ref 0.9–3.3)
LYMPH%: 29.7 % (ref 14.0–49.0)
MCH: 29.6 pg (ref 27.2–33.4)
MCHC: 31.5 g/dL — AB (ref 32.0–36.0)
MCV: 94.1 fL (ref 79.3–98.0)
MONO#: 0.5 10*3/uL (ref 0.1–0.9)
MONO%: 9.7 % (ref 0.0–14.0)
NEUT%: 59.3 % (ref 39.0–75.0)
NEUTROS ABS: 2.8 10*3/uL (ref 1.5–6.5)
PLATELETS: 147 10*3/uL (ref 140–400)
RBC: 4.07 10*6/uL — ABNORMAL LOW (ref 4.20–5.82)
RDW: 16.5 % — ABNORMAL HIGH (ref 11.0–14.6)
WBC: 4.7 10*3/uL (ref 4.0–10.3)

## 2015-03-07 LAB — LACTATE DEHYDROGENASE (CC13): LDH: 312 U/L — ABNORMAL HIGH (ref 125–245)

## 2015-03-07 LAB — MAGNESIUM (CC13): Magnesium: 1.7 mg/dl (ref 1.5–2.5)

## 2015-03-07 LAB — CHROMOSOME ANALYSIS, BONE MARROW

## 2015-03-07 LAB — BRAIN NATRIURETIC PEPTIDE: B NATRIURETIC PEPTIDE 5: 358.3 pg/mL — AB (ref 0.0–100.0)

## 2015-03-07 MED ORDER — HEPARIN SOD (PORK) LOCK FLUSH 100 UNIT/ML IV SOLN
500.0000 [IU] | Freq: Once | INTRAVENOUS | Status: DC | PRN
  Filled 2015-03-07: qty 5

## 2015-03-07 MED ORDER — SODIUM CHLORIDE 0.9 % IV SOLN
1.0000 g | Freq: Once | INTRAVENOUS | Status: AC
  Administered 2015-03-07: 1 g via INTRAVENOUS
  Filled 2015-03-07: qty 2

## 2015-03-07 MED ORDER — FUROSEMIDE 20 MG PO TABS: 20.0000 mg | ORAL_TABLET | Freq: Two times a day (BID) | ORAL | Status: DC

## 2015-03-07 MED ORDER — SODIUM CHLORIDE 0.9 % IJ SOLN
10.0000 mL | INTRAMUSCULAR | Status: DC | PRN
  Filled 2015-03-07: qty 10

## 2015-03-07 NOTE — Patient Instructions (Signed)
Hypomagnesemia Magnesium is a common ion (mineral) in the body which is needed for metabolism. It is about how the body handles food and other chemical reactions necessary for life. Only about 2% of the magnesium in our body is found in the blood. When this is low, it is called hypomagnesemia. The blood will measure only a tiny amount of the magnesium in our body. When it is low in our blood, it does not mean that the whole body supply is low. The normal serum concentration ranges from 1.8-2.5 mEq/L. When the level gets to be less than 1.0 mEq/L, a number of problems begin to happen.  CAUSES   Receiving intravenous fluids without magnesium replacement.  Loss of magnesium from the bowel by nasogastric suction.  Loss of magnesium from nausea and vomiting or severe diarrhea. Any of the inflammatory bowel conditions can cause this.  Abuse of alcohol often leads to low serum magnesium.  An inherited form of magnesium loss happens when the kidneys lose magnesium. This is called familial or primary hypomagnesemia.  Some medications such as diuretics also cause the loss of magnesium. SYMPTOMS  These following problems are worse if the changes in magnesium levels come on suddenly.  Tremor.  Confusion.  Muscle weakness.  Oversensitive to sights and sounds.  Sensitive reflexes.  Depression.  Muscular fibrillations.  Overreactivity of the nerves.  Irritability.  Psychosis.  Spasms of the hand muscles.  Tetany (where the muscles go into uncontrollable spasms). DIAGNOSIS  This condition can be diagnosed by blood tests. TREATMENT   In an emergency, magnesium can be given intravenously (by vein).  If the condition is less worrisome, it can be corrected by diet. High levels of magnesium are found in green leafy vegetables, peas, beans, and nuts among other things. It can also be given through medications by mouth.  If it is being caused by medications, changes can be made.  If  alcohol is a problem, help is available if there are difficulties giving it up. Document Released: 09/03/2005 Document Revised: 04/24/2014 Document Reviewed: 07/28/2008 [employer] Patient Information 2015 [employer]. This information is not intended to replace advice given to you by your health care provider. Make sure you discuss any questions you have with your health care provider.  

## 2015-03-07 NOTE — Progress Notes (Signed)
Pt reports increase in diarrhea last night with some nausea. Reports bilateral lower extremity swelling x 1 week. Small amount of redness, possibly bruising, noted to right foot by toes, not hot to touch, denies pain. Dr. Jana Hakim notified. Selena Lesser, NP to see patient while in infusion.   1015: Selena Lesser, NP at chairside to assess patient.   1140: Per Selena Lesser, NP okay to discharge patient. Pt will increase dose of lasix for swelling.

## 2015-03-08 LAB — IGG, IGA, IGM: IgG (Immunoglobin G), Serum: 147 mg/dL — ABNORMAL LOW (ref 650–1600)

## 2015-03-10 ENCOUNTER — Encounter: Payer: Self-pay | Admitting: Nurse Practitioner

## 2015-03-10 DIAGNOSIS — R609 Edema, unspecified: Secondary | ICD-10-CM | POA: Insufficient documentation

## 2015-03-10 DIAGNOSIS — R600 Localized edema: Secondary | ICD-10-CM | POA: Insufficient documentation

## 2015-03-10 NOTE — Assessment & Plan Note (Signed)
Patient does also suffer with chronic hypomagnesemia.  Magnesium today was 1.7  Patient received 1 g magnesium IV today.  He is scheduled for weekly magnesium infusions; as well as weekly IV fluid rehydration.

## 2015-03-10 NOTE — Progress Notes (Signed)
SYMPTOM MANAGEMENT CLINIC   HPI: Timothy [name] 69 y.o. male diagnosed with chronic lymphocytic leukemia.  Patient is status post bone marrow transplant in July 2013.  Currently undergoing observation.  Patient presented to the [facility] today to receive his magnesium infusion for chronic hypomagnesemia.  Magnesium today was 1.7; and patient received 1 g magnesium IV today.  Patient has a history of chronic diarrhea; most likely secondary to chronic GVHD issues.  He has taken Imodium and Lomotil in the past.  He typically takes Questran on intermittent basis as well.  He initiated a tapering dose of prednisone recently; it states that this has greatly helped with his diarrhea.  He is c/o today of increased edema to his bilat feet.  He denies any chest pain, chest pressure, SOB, or cough. He states that he does suffer with intermittent edema in the past; and has Lasix to take if needed.  He reports that he has not tried the Lasix recently.   Patient denies any recent fevers or chills.   HPI  ROS  Past Medical History  Diagnosis Date  . 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 Diverticular disease   . Hyperlipidemia   . Obesity   . Hypertension   . Hiatal hernia   . CMV (cytomegalovirus) antibody positive     pre-transplant, with seroconversion x2 pst-transplant  . Right bundle branch block     pre-transplant  . CKD (chronic kidney disease) stage 2, GFR 60-89 ml/min   . Pancytopenia   . Atrial fibrillation     post-transplant  . Myopathy   . 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 Diarrhea in adult patient 12/05/2012    Due to active GVHD  . Rash of face 12/05/2012    Due to GVHD  . Hypomagnesemia 01/26/2013  . Left hip pain 12/01/2013  . Steroid-induced diabetes     novalog  . Leukemia, chronic lymphoid   . CLL  (chronic lymphocytic leukemia) 12/05/2012    Dx 07/1999; started Rx 12/06  AlloBMT 3/13    Past Surgical History  Procedure Laterality Date  . Tonsillectomy and adenoidectomy    . Bone marrow transplant    . Flexible sigmoidoscopy  11/17/2012    Procedure: FLEXIBLE SIGMOIDOSCOPY;  Surgeon: Jeryl Columbia, MD;  Location: [facility];  Service: Endoscopy;  Laterality: N/A;  Dr Watt Climes states will be admitted to rooom 1339 11/16/12  . Esophagogastroduodenoscopy  11/17/2012    Procedure: ESOPHAGOGASTRODUODENOSCOPY (EGD);  Surgeon: Jeryl Columbia, MD;  Location: [facility];  Service: Endoscopy;  Laterality: N/A;    has Transplant recipient; Diverticular disease; Hyperlipidemia; Obesity; Hypertension; Hiatal hernia; CMV (cytomegalovirus) antibody positive; Right bundle branch block; CKD (chronic kidney disease) stage 2, GFR 60-89 ml/min; Atrial fibrillation; Myopathy; Fine tremor; Immunocompromised; Chronic GVHD complicating bone marrow transplantation; CLL (chronic lymphocytic leukemia); Physical deconditioning; Chronic back pain; Hypomagnesemia; Other selective immunoglobulin deficiencies; Anemia, unspecified; Left hip pain; Steroid-induced diabetes mellitus; Right hip pain; Osteoporosis; Rash of neck; Creatinine elevation; Hyperkalemia; Chronic renal insufficiency; Fall; Diarrhea; Weakness; and Peripheral edema on his problem list.    is allergic to benadryl.    Medication List       This list is accurate as of: 03/07/15 11:59 PM.  Always use your most recent med list.  acyclovir 400 MG tablet  Commonly known as:  ZOVIRAX  TAKE 2 TABLETS BY MOUTH TWICE DAILY     budesonide 3 MG 24 hr capsule  Commonly known as:  ENTOCORT EC  Take 1 capsule (3 mg total) by mouth 3 (three) times daily.     CARTIA XT 240 MG 24 hr capsule  Generic drug:  diltiazem  TAKE 1 CAPSULE BY MOUTH ONCE DAILY     cholestyramine 4 G packet  Commonly known as:  QUESTRAN  Take 1 packet (4 g total) by  mouth 2 (two) times daily.     fluconazole 100 MG tablet  Commonly known as:  DIFLUCAN  Take 1 tablet (100 mg total) by mouth once.     furosemide 20 MG tablet  Commonly known as:  LASIX  Take 1 tablet (20 mg total) by mouth 2 (two) times daily.     glucose blood test strip  Commonly known as:  ONE TOUCH ULTRA TEST  Test before meals and at bedtime.     insulin aspart 100 unit/mL injection  Commonly known as:  novoLOG  Inject 10 Units into the skin 3 (three) times daily before meals.     Insulin Detemir 100 UNIT/ML Pen  Commonly known as:  LEVEMIR  Inject 47 Units into the skin daily.     Insulin Pen Needle 31G X 5 MM Misc  Commonly known as:  B-D UF III MINI PEN NEEDLES  Use twice daily with insulin as directed     labetalol 200 MG tablet  Commonly known as:  NORMODYNE  Take 2 tablets (400 mg total) by mouth 2 (two) times daily.     Lidocaine-Hydrocortisone Ace 3-0.5 % Kit  Apply 1 application topically as needed (for pain).     lidocaine-prilocaine cream  Commonly known as:  EMLA  Apply 1 application topically as needed. 1-2 hrs before each port access     lisinopril 10 MG tablet  Commonly known as:  PRINIVIL,ZESTRIL  Take 2.5 mg by mouth daily.     loratadine 10 MG tablet  Commonly known as:  CLARITIN  Take 10 mg by mouth daily.     omeprazole 20 MG capsule  Commonly known as:  PRILOSEC  Take 1 capsule (20 mg total) by mouth daily.     ONETOUCH DELICA LANCETS 46F Misc  TEST BEFORE MEALS AND AT BEDTIME.     predniSONE 10 MG tablet  Commonly known as:  DELTASONE  Take $Rem'20mg'Yzlx$  in the morning with breakfast     sertraline 50 MG tablet  Commonly known as:  ZOLOFT  ALTERNATE TAKING 1 TABLET DAILY AND 2 TABLETS DAILY     sodium bicarbonate 325 MG tablet  Take 650 mg by mouth 2 (two) times daily.     sulfamethoxazole-trimethoprim 800-160 MG per tablet  Commonly known as:  BACTRIM DS,SEPTRA DS  Take 1 tablet by mouth daily.     tacrolimus 0.5 MG capsule    Commonly known as:  PROGRAF  Take 3 capsules (1.5 mg total) by mouth 2 (two) times daily.     VOLTAREN 1 % Gel  Generic drug:  diclofenac sodium  Apply 2 g topically 2 (two) times daily. Applied to back         PHYSICAL EXAMINATION  142/81, HR 78, temp 98.2  Physical Exam  Constitutional: He is oriented to person, place, and time. Vital signs are normal. He appears unhealthy.  Patient appears weak, frail, and chronically ill.  HENT:  Head: Normocephalic and atraumatic.  Mouth/Throat: Oropharynx is clear and moist.  Eyes: Conjunctivae and EOM are normal. Pupils are equal, round, and reactive to light. Right eye exhibits no discharge. Left eye exhibits no discharge. No scleral icterus.  Neck: Normal range of motion. Neck supple.  Pulmonary/Chest: Effort normal and breath sounds normal. No respiratory distress.  Musculoskeletal: Normal range of motion. He exhibits edema and tenderness.  Bilat +3 edema to feet.   Neurological: He is alert and oriented to person, place, and time.  Skin: Skin is warm and dry.  Psychiatric: Affect normal.  Nursing note and vitals reviewed.   LABORATORY DATA:. Hospital Outpatient Visit on 03/07/2015  Component Date Value Ref Range Status  . B Natriuretic Peptide 03/07/2015 358.3* 0.0 - 100.0 pg/mL Final  Appointment on 03/07/2015  Component Date Value Ref Range Status  . WBC 03/07/2015 4.7  4.0 - 10.3 10e3/uL Final  . NEUT# 03/07/2015 2.8  1.5 - 6.5 10e3/uL Final  . HGB 03/07/2015 12.1* 13.0 - 17.1 g/dL Final  . HCT 03/07/2015 38.3* 38.4 - 49.9 % Final  . Platelets 03/07/2015 147  140 - 400 10e3/uL Final  . MCV 03/07/2015 94.1  79.3 - 98.0 fL Final  . MCH 03/07/2015 29.6  27.2 - 33.4 pg Final  . MCHC 03/07/2015 31.5* 32.0 - 36.0 g/dL Final  . RBC 03/07/2015 4.07* 4.20 - 5.82 10e6/uL Final  . RDW 03/07/2015 16.5* 11.0 - 14.6 % Final  . lymph# 03/07/2015 1.4  0.9 - 3.3 10e3/uL Final  . MONO# 03/07/2015 0.5  0.1 - 0.9 10e3/uL Final  .  Eosinophils Absolute 03/07/2015 0.0  0.0 - 0.5 10e3/uL Final  . Basophils Absolute 03/07/2015 0.0  0.0 - 0.1 10e3/uL Final  . NEUT% 03/07/2015 59.3  39.0 - 75.0 % Final  . LYMPH% 03/07/2015 29.7  14.0 - 49.0 % Final  . MONO% 03/07/2015 9.7  0.0 - 14.0 % Final  . EOS% 03/07/2015 0.8  0.0 - 7.0 % Final  . BASO% 03/07/2015 0.5  0.0 - 2.0 % Final  . Sodium 03/07/2015 140  136 - 145 mEq/L Final  . Potassium 03/07/2015 5.1  3.5 - 5.1 mEq/L Final  . Chloride 03/07/2015 114* 98 - 109 mEq/L Final  . CO2 03/07/2015 16* 22 - 29 mEq/L Final  . Glucose 03/07/2015 166* 70 - 140 mg/dl Final  . BUN 03/07/2015 45.7* 7.0 - 26.0 mg/dL Final  . Creatinine 03/07/2015 1.2  0.7 - 1.3 mg/dL Final  . Total Bilirubin 03/07/2015 0.32  0.20 - 1.20 mg/dL Final  . Alkaline Phosphatase 03/07/2015 140  40 - 150 U/L Final  . AST 03/07/2015 24  5 - 34 U/L Final  . ALT 03/07/2015 27  0 - 55 U/L Final  . Total Protein 03/07/2015 5.1* 6.4 - 8.3 g/dL Final  . Albumin 03/07/2015 3.2* 3.5 - 5.0 g/dL Final  . Calcium 03/07/2015 8.1* 8.4 - 10.4 mg/dL Final  . Anion Gap 03/07/2015 10  3 - 11 mEq/L Final  . EGFR 03/07/2015 59* >90 ml/min/1.73 m2 Final   eGFR is calculated using the CKD-EPI Creatinine Equation (2009)  . IgG (Immunoglobin G), Serum 03/07/2015 147* 650 - 1600 mg/dL Final  . IgA 03/07/2015 <6* 68 - 379 mg/dL Final  . IgM, Serum 03/07/2015 <4* 41 - 251 mg/dL Final  . LDH 03/07/2015 312* 125 - 245 U/L Final  . Magnesium 03/07/2015 1.7  1.5 - 2.5 mg/dl Final     RADIOGRAPHIC STUDIES: No results found.  ASSESSMENT/PLAN:  CLL (chronic lymphocytic leukemia) Patient is status post bone marrow transplant on 07/12/2012.  He does suffer with chronic graft versus host disease in the form of chronic diarrhea; but is otherwise doing fairly well.  He is scheduled for labs, follow up visit, and Iv fluids/magnesium on 03/14/15.       Diarrhea Patient is status post bone marrow transplant on 07/12/2012.  He does  suffer with chronic graft versus host disease in the form of chronic diarrhea; but is otherwise doing fairly well.  He continues to take prednisone 10 mg QD to help manage his chronic diarrhea.   He also returns weekly for IV fluid rehydration.    Hypomagnesemia Patient does also suffer with chronic hypomagnesemia.  Magnesium today was 1.7  Patient received 1 g magnesium IV today.  He is scheduled for weekly magnesium infusions; as well as weekly IV fluid rehydration.     Peripheral edema PT has hx of intermittent/chronic lower extremity edema; and has Lasix prescription- but he only occasionally takes it.  He is c/o increased edema to bilat feet within this past week.  He denies any chest pain, chest pressure, SOB, or pain with inspiration. On exam- Pt with +3 pitting edema.  Lungs clear; with no SOB.    BNP only slightly elevated to 358.   Advised pt to start taking his lasix again BID until he returns next week for a follow up visit.     Patient stated understanding of all instructions; and was in agreement with this plan of care. The patient knows to call the clinic with any problems, questions or concerns.   Review/collaboration with Dr. Jana Hakim regarding all aspects of patient's visit today.   Total time spent with patient was 25 minutes;  with greater than 75 percent of that time spent in face to face counseling regarding patient's symptoms,  and coordination of care and follow up.  Disclaimer: This note was dictated with voice recognition software. Similar sounding words can inadvertently be transcribed and may not be corrected upon review.   Drue Second, NP 03/10/2015

## 2015-03-10 NOTE — Assessment & Plan Note (Addendum)
PT has hx of intermittent/chronic lower extremity edema; and has Lasix prescription- but he only occasionally takes it.  He is c/o increased edema to bilat feet within this past week.  He denies any chest pain, chest pressure, SOB, or pain with inspiration. On exam- Pt with +3 pitting edema.  Lungs clear; with no SOB.    BNP only slightly elevated to 358.   Advised pt to start taking his lasix again BID until he returns next week for a follow up visit.

## 2015-03-10 NOTE — Assessment & Plan Note (Addendum)
Patient is status post bone marrow transplant on 07/12/2012.  He does suffer with chronic graft versus host disease in the form of chronic diarrhea; but is otherwise doing fairly well.  He continues to take prednisone 10 mg QD to help manage his chronic diarrhea.   He also returns weekly for IV fluid rehydration.

## 2015-03-10 NOTE — Assessment & Plan Note (Signed)
Patient is status post bone marrow transplant on 07/12/2012.  He does suffer with chronic graft versus host disease in the form of chronic diarrhea; but is otherwise doing fairly well.  He is scheduled for labs, follow up visit, and Iv fluids/magnesium on 03/14/15.

## 2015-03-13 ENCOUNTER — Telehealth: Payer: Self-pay | Admitting: *Deleted

## 2015-03-13 ENCOUNTER — Other Ambulatory Visit: Payer: Self-pay | Admitting: Oncology

## 2015-03-13 ENCOUNTER — Encounter: Payer: Self-pay | Admitting: *Deleted

## 2015-03-13 NOTE — Telephone Encounter (Signed)
Left message for patient to follow up on visit with Cyndee Bacon,NP. To call if has questions

## 2015-03-13 NOTE — Progress Notes (Signed)
Received voice message from [facility] that they are requesting labs and cytogenetic report from 02/27/15. Papers faxed to [phone].

## 2015-03-14 ENCOUNTER — Ambulatory Visit (HOSPITAL_BASED_OUTPATIENT_CLINIC_OR_DEPARTMENT_OTHER): Payer: BC Managed Care – PPO | Admitting: Nurse Practitioner

## 2015-03-14 ENCOUNTER — Encounter: Payer: Self-pay | Admitting: Nurse Practitioner

## 2015-03-14 ENCOUNTER — Ambulatory Visit (HOSPITAL_BASED_OUTPATIENT_CLINIC_OR_DEPARTMENT_OTHER): Payer: BC Managed Care – PPO

## 2015-03-14 ENCOUNTER — Other Ambulatory Visit (HOSPITAL_BASED_OUTPATIENT_CLINIC_OR_DEPARTMENT_OTHER): Payer: BC Managed Care – PPO

## 2015-03-14 VITALS — BP 123/74 | HR 72 | Temp 97.9°F | Resp 81 | Ht 66.0 in | Wt 218.8 lb

## 2015-03-14 DIAGNOSIS — C9110 Chronic lymphocytic leukemia of B-cell type not having achieved remission: Secondary | ICD-10-CM

## 2015-03-14 DIAGNOSIS — E46 Unspecified protein-calorie malnutrition: Secondary | ICD-10-CM

## 2015-03-14 DIAGNOSIS — E291 Testicular hypofunction: Secondary | ICD-10-CM

## 2015-03-14 DIAGNOSIS — E860 Dehydration: Secondary | ICD-10-CM

## 2015-03-14 DIAGNOSIS — E2740 Unspecified adrenocortical insufficiency: Secondary | ICD-10-CM

## 2015-03-14 DIAGNOSIS — T8609 Other complications of bone marrow transplant: Secondary | ICD-10-CM

## 2015-03-14 DIAGNOSIS — R609 Edema, unspecified: Secondary | ICD-10-CM | POA: Diagnosis not present

## 2015-03-14 DIAGNOSIS — D801 Nonfamilial hypogammaglobulinemia: Secondary | ICD-10-CM

## 2015-03-14 DIAGNOSIS — D89811 Chronic graft-versus-host disease: Secondary | ICD-10-CM

## 2015-03-14 LAB — CBC WITH DIFFERENTIAL/PLATELET
BASO%: 0 % (ref 0.0–2.0)
BASOS ABS: 0 10*3/uL (ref 0.0–0.1)
EOS%: 0.4 % (ref 0.0–7.0)
Eosinophils Absolute: 0 10*3/uL (ref 0.0–0.5)
HEMATOCRIT: 36 % — AB (ref 38.4–49.9)
HGB: 11.9 g/dL — ABNORMAL LOW (ref 13.0–17.1)
LYMPH%: 29 % (ref 14.0–49.0)
MCH: 30.7 pg (ref 27.2–33.4)
MCHC: 33.1 g/dL (ref 32.0–36.0)
MCV: 92.8 fL (ref 79.3–98.0)
MONO#: 0.5 10*3/uL (ref 0.1–0.9)
MONO%: 9.8 % (ref 0.0–14.0)
NEUT%: 60.8 % (ref 39.0–75.0)
NEUTROS ABS: 2.8 10*3/uL (ref 1.5–6.5)
Platelets: 125 10*3/uL — ABNORMAL LOW (ref 140–400)
RBC: 3.88 10*6/uL — ABNORMAL LOW (ref 4.20–5.82)
RDW: 15.9 % — ABNORMAL HIGH (ref 11.0–14.6)
WBC: 4.6 10*3/uL (ref 4.0–10.3)
lymph#: 1.3 10*3/uL (ref 0.9–3.3)
nRBC: 0 % (ref 0–0)

## 2015-03-14 LAB — COMPREHENSIVE METABOLIC PANEL (CC13)
ALBUMIN: 3.3 g/dL — AB (ref 3.5–5.0)
ALT: 28 U/L (ref 0–55)
AST: 28 U/L (ref 5–34)
Alkaline Phosphatase: 140 U/L (ref 40–150)
Anion Gap: 10 mEq/L (ref 3–11)
BUN: 44.5 mg/dL — AB (ref 7.0–26.0)
CHLORIDE: 112 meq/L — AB (ref 98–109)
CO2: 17 mEq/L — ABNORMAL LOW (ref 22–29)
Calcium: 8.4 mg/dL (ref 8.4–10.4)
Creatinine: 1.3 mg/dL (ref 0.7–1.3)
EGFR: 55 mL/min/{1.73_m2} — ABNORMAL LOW (ref >90)
Glucose: 109 mg/dl (ref 70–140)
Potassium: 4.8 mEq/L (ref 3.5–5.1)
Sodium: 140 mEq/L (ref 136–145)
Total Bilirubin: 0.3 mg/dL (ref 0.20–1.20)
Total Protein: 5.3 g/dL — ABNORMAL LOW (ref 6.4–8.3)

## 2015-03-14 LAB — TECHNOLOGIST REVIEW

## 2015-03-14 LAB — MAGNESIUM (CC13): MAGNESIUM: 1.6 mg/dL (ref 1.5–2.5)

## 2015-03-14 MED ORDER — HEPARIN SOD (PORK) LOCK FLUSH 100 UNIT/ML IV SOLN
500.0000 [IU] | Freq: Once | INTRAVENOUS | Status: AC | PRN
  Administered 2015-03-14: 500 [IU]
  Filled 2015-03-14: qty 5

## 2015-03-14 MED ORDER — SODIUM CHLORIDE 0.9 % IV SOLN
1.0000 g | Freq: Once | INTRAVENOUS | Status: AC
  Administered 2015-03-14: 1 g via INTRAVENOUS
  Filled 2015-03-14: qty 2

## 2015-03-14 NOTE — Patient Instructions (Signed)
Hypomagnesemia Magnesium is a common ion (mineral) in the body which is needed for metabolism. It is about how the body handles food and other chemical reactions necessary for life. Only about 2% of the magnesium in our body is found in the blood. When this is low, it is called hypomagnesemia. The blood will measure only a tiny amount of the magnesium in our body. When it is low in our blood, it does not mean that the whole body supply is low. The normal serum concentration ranges from 1.8-2.5 mEq/L. When the level gets to be less than 1.0 mEq/L, a number of problems begin to happen.  CAUSES   Receiving intravenous fluids without magnesium replacement.  Loss of magnesium from the bowel by nasogastric suction.  Loss of magnesium from nausea and vomiting or severe diarrhea. Any of the inflammatory bowel conditions can cause this.  Abuse of alcohol often leads to low serum magnesium.  An inherited form of magnesium loss happens when the kidneys lose magnesium. This is called familial or primary hypomagnesemia.  Some medications such as diuretics also cause the loss of magnesium. SYMPTOMS  These following problems are worse if the changes in magnesium levels come on suddenly.  Tremor.  Confusion.  Muscle weakness.  Oversensitive to sights and sounds.  Sensitive reflexes.  Depression.  Muscular fibrillations.  Overreactivity of the nerves.  Irritability.  Psychosis.  Spasms of the hand muscles.  Tetany (where the muscles go into uncontrollable spasms). DIAGNOSIS  This condition can be diagnosed by blood tests. TREATMENT   In an emergency, magnesium can be given intravenously (by vein).  If the condition is less worrisome, it can be corrected by diet. High levels of magnesium are found in green leafy vegetables, peas, beans, and nuts among other things. It can also be given through medications by mouth.  If it is being caused by medications, changes can be made.  If  alcohol is a problem, help is available if there are difficulties giving it up. Document Released: 09/03/2005 Document Revised: 04/24/2014 Document Reviewed: 07/28/2008 [employer] Patient Information 2015 [employer]. This information is not intended to replace advice given to you by your health care provider. Make sure you discuss any questions you have with your health care provider.  

## 2015-03-14 NOTE — Progress Notes (Signed)
ID: Timothy [name]   DOB: 08/14/1946  MR#: [id number]  XJO#:[id number]  EBR:AXEN, [name], MD SU: OTHER MD: [name]; [name], MD; [name], [name],MD; [name], MD; [facility] in [place], [place]:  [name], RN [phone]), [name] M.D.  CHIEF COMPLAINT:  CLL, status post allogeneic stem cell transplant, GVHD CURRENT TREATMENT: Immunosuppression  HISTORY OF CLL: From the original intake note:  We have very complete records from Dr. Racheal Patches in [place], and in summary:  The patient was initially diagnosed in August 2000, with a white cell count of 23,600, but normal hemoglobin and platelets, and no significant symptomatology. Over the next several years his white cell count drifted up, and he eventually developed some symptoms of night sweats in particular, leading to treatment with fludarabine, Cytoxan and rituxan for five cycles given between December 2006 and May 2007.  We have CT scans from June 2006, November 2006 and April 2007, and comparing the November 2006 and April 2007 scans, there was near complete response. He had subsequent therapy in [place] as detailed below, but with decreased response, leading to allogeneic stem-cell transplant at the [facility] 02/24/2012.  Subsequent history is as detailed below.  INTERVAL HISTORY: Timothy [name] returns today for follow up of his chronic lymphoid leukemia and graft-versus-host disease accompanied his wife, [name]. The interval history is remarkable for a visit to our symptom management clinic last week. At this time he was having immense foot pain and swelling. His lasix was increased to 21m BID and he as seen some improvement in the swelling. He can put his shoes on if he leaves the laces undone, and there is no more pain.  REVIEW OF SYSTEMS: TChrlesdenies fevers, chills, nausea, or vomiting. He continues to have 5-6 bowel movements daily and uses  imodium and questran powder daily. They are usually small and soft ,but occasionally larger and loose. He is eating and drinking well. His blood sugars run in the 150s. His strength has improved now that he is only on 139mprednisone daily.  He continues to have tremors however. He denies shortness of breath, chest pain, cough, or palpitations. He does not sleep well at night because he wakes to use the bathroom. He denies headaches, dizziness, night sweats, or vision changes. A detailed review of systems is otherwise stable.  PAST MEDICAL HISTORY: Past Medical History  Diagnosis Date  . 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 Diverticular disease   . Hyperlipidemia   . Obesity   . Hypertension   . Hiatal hernia   . CMV (cytomegalovirus) antibody positive     pre-transplant, with seroconversion x2 pst-transplant  . Right bundle branch block     pre-transplant  . CKD (chronic kidney disease) stage 2, GFR 60-89 ml/min   . Pancytopenia   . Atrial fibrillation     post-transplant  . Myopathy   . 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 Diarrhea in adult patient 12/05/2012    Due to active GVHD  . Rash of face 12/05/2012    Due to GVHD  . Hypomagnesemia 01/26/2013  . Left hip pain 12/01/2013  . Steroid-induced diabetes     novalog  . Leukemia, chronic lymphoid   . CLL (chronic lymphocytic leukemia) 12/05/2012    Dx 07/1999; started Rx 12/06  AlloBMT 3/13  PAST SURGICAL HISTORY: Past Surgical History  Procedure Laterality Date  . Tonsillectomy and adenoidectomy    . Bone marrow transplant    . Flexible sigmoidoscopy  11/17/2012    Procedure: FLEXIBLE SIGMOIDOSCOPY;  Surgeon: Jeryl Columbia, MD;  Location: [facility];  Service: Endoscopy;  Laterality: N/A;  Dr Watt Climes states will be admitted to rooom 1339 11/16/12  .  Esophagogastroduodenoscopy  11/17/2012    Procedure: ESOPHAGOGASTRODUODENOSCOPY (EGD);  Surgeon: Jeryl Columbia, MD;  Location: [facility];  Service: Endoscopy;  Laterality: N/A;    FAMILY HISTORY Family History  Problem Relation Age of Onset  . Cancer Father   The patient's father died from complications of chronic lymphocytic leukemia at the age of 6.  It had been diagnosed seven years before when he was 53.  The patient is enrolled in a familial chronic lymphocytic leukemia study out of the [employer].  The patient's mother is 49, alive, unfortunately suffering with dementia, and he has a brother, 66, who is otherwise in fair health.   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  His wife of >40 years, [name], is a homemaker.  Their daughter, [name], lives in [place].  She also is a Agricultural engineer.  The patient has an 31 year old grandson and an 23-year-old granddaughter, and that is really the main reason he moved to this area.  He is a Tourist information centre manager.     ADVANCED DIRECTIVES: In place  HEALTH MAINTENANCE: (Updated 04/13/2014) History  Substance Use Topics  . Smoking status: Never Smoker   . Smokeless tobacco: Never Used  . Alcohol Use: No     Colonoscopy: Nov 2013, Dr. Watt Climes  PSA: Not on file  Bone density:  Feb 2014;  Patient also has known insufficiency and pathologic fractures in addition to his long-standing history of steroid use.  Lipid panel: Jan 2015, elevated    Allergies  Allergen Reactions  . Benadryl [Diphenhydramine Hcl]     "Restless leg syndrome"    Current Outpatient Prescriptions  Medication Sig Dispense Refill  . acyclovir (ZOVIRAX) 400 MG tablet TAKE 2 TABLETS BY MOUTH TWICE DAILY 120 tablet 1  . budesonide (ENTOCORT EC) 3 MG  24 hr capsule Take 1 capsule (3 mg total) by mouth 3 (three) times daily. 90 capsule 6  . CARTIA XT 240 MG 24 hr capsule TAKE 1 CAPSULE BY MOUTH ONCE DAILY (Patient taking differently: take 1 capsule at bedtime) 30 capsule 3  . cholestyramine (QUESTRAN) 4 G packet Take 1 packet (4 g total) by mouth 2 (two) times daily. 60 each 12  . fluconazole (DIFLUCAN) 100 MG tablet Take 1 tablet (100 mg total) by mouth once. (Patient taking differently: Take 100 mg by mouth daily. ) 30 tablet 3  . furosemide (LASIX) 20 MG tablet Take 1 tablet (20 mg total) by mouth 2 (two) times daily. 60 tablet 1  . glucose blood (ONE TOUCH ULTRA TEST) test strip Test before meals and at bedtime. 300 each 2  . insulin aspart (NOVOLOG) 100 unit/mL injection Inject 10 Units into the skin 3 (three) times daily before meals.    . Insulin Detemir (LEVEMIR) 100 UNIT/ML Pen Inject 47 Units into the skin daily.     . Insulin Pen Needle (B-D UF III MINI PEN NEEDLES) 31G X 5 MM  MISC Use twice daily with insulin as directed 100 each 5  . labetalol (NORMODYNE) 200 MG tablet Take 2 tablets (400 mg total) by mouth 2 (two) times daily. 120 tablet 1  . Lidocaine-Hydrocortisone Ace 3-0.5 % KIT Apply 1 application topically as needed (for pain). 1 each 3  . lidocaine-prilocaine (EMLA) cream Apply 1 application topically as needed. 1-2 hrs before each port access 30 g 6  . lisinopril (PRINIVIL,ZESTRIL) 10 MG tablet Take 10 mg by mouth daily.     Marland Kitchen loratadine (CLARITIN) 10 MG tablet Take 10 mg by mouth daily.    Marland Kitchen omeprazole (PRILOSEC) 20 MG capsule Take 1 capsule (20 mg total) by mouth daily. 30 capsule 5  . ONETOUCH DELICA LANCETS 94T MISC TEST BEFORE MEALS AND AT BEDTIME. 300 each 0  . predniSONE (DELTASONE) 10 MG tablet Take 43m in the morning with breakfast (Patient taking differently: Take by mouth daily with breakfast. ) 30 tablet 1  . sertraline (ZOLOFT) 50 MG tablet ALTERNATE TAKING 1 TABLET DAILY AND 2 TABLETS DAILY (Patient taking  differently: Take 50 mg by mouth every morning. ) 90 tablet 5  . sodium bicarbonate 325 MG tablet Take 650 mg by mouth 2 (two) times daily.     .Marland Kitchensulfamethoxazole-trimethoprim (BACTRIM DS,SEPTRA DS) 800-160 MG per tablet Take 1 tablet by mouth daily. 30 tablet 5  . tacrolimus (PROGRAF) 0.5 MG capsule Take 3 capsules (1.5 mg total) by mouth 2 (two) times daily. 180 capsule 4  . VOLTAREN 1 % GEL Apply 2 g topically 2 (two) times daily. Applied to back    . [DISCONTINUED] insulin aspart (NOVOLOG FLEXPEN) 100 UNIT/ML SOPN FlexPen 18units sq qam, 9units sq qpm, or as directed 15 mL 1   No current facility-administered medications for this visit.   Facility-Administered Medications Ordered in Other Visits  Medication Dose Route Frequency Provider Last Rate Last Dose  . 0.9 %  sodium chloride infusion   Intravenous Continuous Amy G Berry, PA-C 500 mL/hr at 03/12/13 0900    . sodium chloride 0.9 % injection 10 mL  10 mL Intravenous PRN GChauncey Cruel MD   10 mL at 08/11/12 1606    OBJECTIVE: Middle-aged white man who appears stated age  F55Vitals:   03/14/15 1109  BP: 123/74  Pulse: 72  Temp: 97.9 F (36.6 C)  Resp: 81  Body mass index is 35.33 kg/(m^2).  ECOG: 2 Filed Weights   03/14/15 1109  Weight: 218 lb 12.8 oz (99.247 kg)   Skin: warm, dry, diffusing bruising to bilateral arms, hyperpigmentation to bilateral hands HEENT: sclerae anicteric, conjunctivae pink, oropharynx clear. No thrush or mucositis.  Lymph Nodes: No cervical or supraclavicular lymphadenopathy  Lungs: clear to auscultation bilaterally, no rales, wheezes, or rhonci  Heart: regular rate and rhythm  Abdomen: round, soft, non tender, positive bowel sounds  Musculoskeletal: No focal spinal tenderness, +3 pitting edema to bilateral lower extremities  Neuro: non focal, well oriented, positive affect    LABS:  CBC    Component Value Date/Time   WBC 4.6 03/14/2015 1056   WBC 3.2* 02/27/2015 0805   RBC 3.88*  03/14/2015 1056   RBC 3.64* 02/27/2015 0805   RBC 3.82* 03/16/2013 1400   HGB 11.9* 03/14/2015 1056   HGB 11.3* 02/27/2015 0805   HCT 36.0* 03/14/2015 1056   HCT 34.1* 02/27/2015 0805   PLT 125* 03/14/2015 1056   PLT 120* 02/27/2015 0805   MCV 92.8 03/14/2015 1056   MCV 93.7 02/27/2015 0805  MCH 30.7 03/14/2015 1056   MCH 31.0 02/27/2015 0805   MCHC 33.1 03/14/2015 1056   MCHC 33.1 02/27/2015 0805   RDW 15.9* 03/14/2015 1056   RDW 16.2* 02/27/2015 0805   LYMPHSABS 1.3 03/14/2015 1056   LYMPHSABS 1.4 03/18/2013 0615   MONOABS 0.5 03/14/2015 1056   MONOABS 0.3 03/18/2013 0615   EOSABS 0.0 03/14/2015 1056   EOSABS 0.0 03/18/2013 0615   BASOSABS 0.0 03/14/2015 1056   BASOSABS 0.0 03/18/2013 0615       Chemistry      Component Value Date/Time   NA 140 03/14/2015 1056   NA 137 02/27/2015 0805   K 4.8 03/14/2015 1056   K 4.8 02/27/2015 0805   CL 117* 02/27/2015 0805   CL 101 06/15/2013 1034   CO2 17* 03/14/2015 1056   CO2 18* 02/27/2015 0805   BUN 44.5* 03/14/2015 1056   BUN 40* 02/27/2015 0805   CREATININE 1.3 03/14/2015 1056   CREATININE 1.44* 02/27/2015 0805      Component Value Date/Time   CALCIUM 8.4 03/14/2015 1056   CALCIUM 8.2* 02/27/2015 0805   ALKPHOS 140 03/14/2015 1056   ALKPHOS 80 09/07/2014 1324   AST 28 03/14/2015 1056   AST 40* 09/07/2014 1324   ALT 28 03/14/2015 1056   ALT 55* 09/07/2014 1324   BILITOT 0.30 03/14/2015 1056   BILITOT <0.2* 09/07/2014 1324     STUDIES: Dg Thoracic Spine 2 View  02/15/2015   CLINICAL DATA:  Fall 2 days ago at home, upper and lower back pain, history of leukemia  EXAM: THORACIC SPINE - 2 VIEW  COMPARISON:  08/09/2014  FINDINGS: Three views of thoracic spine submitted. No acute fracture or subluxation. Right IJ Port-A-Cath with tip in SVC. Stable compression deformities mid and lower thoracic spine. Alignment is preserved. Degenerative changes mid and lower thoracic spine.  IMPRESSION: No acute fracture or subluxation.  Stable chronic compression deformities mid and lower thoracic spine.   Electronically Signed   By: Lahoma Crocker M.D.   On: 02/15/2015 15:30   Dg Lumbar Spine 2-3 Views  02/15/2015   CLINICAL DATA:  Fall 2 days ago at home, back pain, thoracic pain  EXAM: LUMBAR SPINE - 2-3 VIEW  COMPARISON:  Thoracic spine MRI 8/19/ 15  FINDINGS: Two views of lumbar spine submitted. No acute fracture or subluxation. Stable compression deformities T11 and T12 vertebral bodies. Again noted disc space flattening at T12-L1 level. Multilevel moderate anterior spurring. There is mild compression deformity and prior vertebroplasty at L3 and L4 level. Facet degenerative changes are noted L4 and L5 level.  IMPRESSION: No acute fracture or subluxation. Prior vertebroplasty and mild compression deformities L3 and L4 vertebral bodies. Multilevel degenerative changes. Stable compression deformities of T12 and T11 vertebral bodies.   Electronically Signed   By: Lahoma Crocker M.D.   On: 02/15/2015 15:32   Ct Chest W Contrast  02/19/2015   CLINICAL DATA:  Restaging chronic lymphocytic leukemia status post bone marrow transplant in May 2013. Subsequent encounter.  EXAM: CT CHEST, ABDOMEN, AND PELVIS WITH CONTRAST  TECHNIQUE: Multidetector CT imaging of the chest, abdomen and pelvis was performed following the standard protocol during bolus administration of intravenous contrast.  CONTRAST:  177m OMNIPAQUE IOHEXOL 300 MG/ML  SOLN  COMPARISON:  Thoracic MRI 08/09/2014.  CTs 02/21/2014.  FINDINGS: CT CHEST FINDINGS  Mediastinum/Nodes: There are no enlarged mediastinal, hilar or axillary lymph nodes. The thyroid gland, trachea and esophagus demonstrate no significant findings. A small hiatal hernia appears  unchanged. The heart size is normal. There is no pleural or pericardial effusion.Right IJ Port-A-Cath tip is at the SVC right atrial junction. There is stable mild atherosclerosis of the aorta, great vessels and coronary arteries.  Lungs/Pleura:  There is no pleural effusion.There is mild linear scarring or atelectasis at both lung bases which is similar to the prior examination. The lungs are otherwise clear.  Musculoskeletal/Chest wall: Old rib fractures are present bilaterally. There are several chronic thoracic compression deformities. There is a new nearly horizontal fracture through the T10 vertebral body, most apparent on the reformatted images. This is associated with a small amount of paraspinal hemorrhage and appears acute/subacute. There is no osseous retropulsion or malalignment. No underlying lytic lesion identified.  CT ABDOMEN AND PELVIS FINDINGS  Hepatobiliary: Stable low-density lesion in the lateral segment left hepatic lobe. The liver otherwise appears unremarkable. No evidence of gallstones, gallbladder wall thickening or biliary dilatation.  Pancreas: Mild pancreatic atrophy. No focal abnormality, surrounding inflammatory change or pancreatic ductal dilatation.  Spleen: Normal in size without focal abnormality.  Adrenals/Urinary Tract: Both adrenal glands appear normal.The kidneys appear normal without evidence of urinary tract calculus, suspicious lesion or hydronephrosis. No bladder abnormalities are seen. The bladder is incompletely distended.  Stomach/Bowel: No evidence of bowel wall thickening, distention or surrounding inflammatory change.There are chronic diverticular changes of the distal colon.  Vascular/Lymphatic: There are no enlarged abdominal or pelvic lymph nodes. Stable aortoiliac atherosclerosis and tortuosity.  Reproductive: Stable mild enlargement of the prostate gland.  Other: Stable 4.5 cm umbilical hernia containing only fat. There is stable fat within both inguinal canals.  Musculoskeletal: Irregular sclerosis is again noted throughout the sacrum bilaterally, likely due to chronic insufficiency fractures. No acute fractures are demonstrated. Multiple lumbar compression deformities are stable status post augmentation  at L3 and L4. A chronic lucent lesion in the right iliac bone is unchanged.  IMPRESSION: 1. No evidence of recurrent lymphadenopathy or splenomegaly to suggest recurrent chronic lymphocytic leukemia. 2. New nearly horizontal fracture through the T10 vertebral body, likely an insufficiency fracture. 3. No other acute fractures identified. There are multiple chronic fractures in the thoracolumbar spine, sacrum and bilateral ribs. 4. Stable incidental findings including distal colonic diverticulosis and an umbilical hernia containing only fat. 5. These results will be called to the ordering clinician or representative by the Radiologist Assistant, and communication documented in the PACS or zVision Dashboard.   Electronically Signed   By: Richardean Sale M.D.   On: 02/19/2015 09:24   Ct Abdomen Pelvis W Contrast  02/19/2015   CLINICAL DATA:  Restaging chronic lymphocytic leukemia status post bone marrow transplant in May 2013. Subsequent encounter.  EXAM: CT CHEST, ABDOMEN, AND PELVIS WITH CONTRAST  TECHNIQUE: Multidetector CT imaging of the chest, abdomen and pelvis was performed following the standard protocol during bolus administration of intravenous contrast.  CONTRAST:  126m OMNIPAQUE IOHEXOL 300 MG/ML  SOLN  COMPARISON:  Thoracic MRI 08/09/2014.  CTs 02/21/2014.  FINDINGS: CT CHEST FINDINGS  Mediastinum/Nodes: There are no enlarged mediastinal, hilar or axillary lymph nodes. The thyroid gland, trachea and esophagus demonstrate no significant findings. A small hiatal hernia appears unchanged. The heart size is normal. There is no pleural or pericardial effusion.Right IJ Port-A-Cath tip is at the SVC right atrial junction. There is stable mild atherosclerosis of the aorta, great vessels and coronary arteries.  Lungs/Pleura: There is no pleural effusion.There is mild linear scarring or atelectasis at both lung bases which is similar to the prior examination. The  lungs are otherwise clear.  Musculoskeletal/Chest  wall: Old rib fractures are present bilaterally. There are several chronic thoracic compression deformities. There is a new nearly horizontal fracture through the T10 vertebral body, most apparent on the reformatted images. This is associated with a small amount of paraspinal hemorrhage and appears acute/subacute. There is no osseous retropulsion or malalignment. No underlying lytic lesion identified.  CT ABDOMEN AND PELVIS FINDINGS  Hepatobiliary: Stable low-density lesion in the lateral segment left hepatic lobe. The liver otherwise appears unremarkable. No evidence of gallstones, gallbladder wall thickening or biliary dilatation.  Pancreas: Mild pancreatic atrophy. No focal abnormality, surrounding inflammatory change or pancreatic ductal dilatation.  Spleen: Normal in size without focal abnormality.  Adrenals/Urinary Tract: Both adrenal glands appear normal.The kidneys appear normal without evidence of urinary tract calculus, suspicious lesion or hydronephrosis. No bladder abnormalities are seen. The bladder is incompletely distended.  Stomach/Bowel: No evidence of bowel wall thickening, distention or surrounding inflammatory change.There are chronic diverticular changes of the distal colon.  Vascular/Lymphatic: There are no enlarged abdominal or pelvic lymph nodes. Stable aortoiliac atherosclerosis and tortuosity.  Reproductive: Stable mild enlargement of the prostate gland.  Other: Stable 4.5 cm umbilical hernia containing only fat. There is stable fat within both inguinal canals.  Musculoskeletal: Irregular sclerosis is again noted throughout the sacrum bilaterally, likely due to chronic insufficiency fractures. No acute fractures are demonstrated. Multiple lumbar compression deformities are stable status post augmentation at L3 and L4. A chronic lucent lesion in the right iliac bone is unchanged.  IMPRESSION: 1. No evidence of recurrent lymphadenopathy or splenomegaly to suggest recurrent chronic lymphocytic  leukemia. 2. New nearly horizontal fracture through the T10 vertebral body, likely an insufficiency fracture. 3. No other acute fractures identified. There are multiple chronic fractures in the thoracolumbar spine, sacrum and bilateral ribs. 4. Stable incidental findings including distal colonic diverticulosis and an umbilical hernia containing only fat. 5. These results will be called to the ordering clinician or representative by the Radiologist Assistant, and communication documented in the PACS or zVision Dashboard.   Electronically Signed   By: Richardean Sale M.D.   On: 02/19/2015 09:24   Ct Biopsy  02/27/2015   CLINICAL DATA:  Leukemia  EXAM: CT-GUIDED BONE MARROW ASPIRATE AND CORE BIOPSY.  MEDICATIONS AND MEDICAL HISTORY: Versed 0 mg, Fentanyl 100 mcg.  Additional Medications: None.  ANESTHESIA/SEDATION: Moderate sedation time: 15 minutes  PROCEDURE: The procedure, risks, benefits, and alternatives were explained to the patient. Questions regarding the procedure were encouraged and answered. The patient understands and consents to the procedure.  The back was prepped with Betadine in a sterile fashion, and a sterile drape was applied covering the operative field. A sterile gown and sterile gloves were used for the procedure.  Under CT guidance, an 11 gauge needle was inserted into the right iliac bone and aspirates were obtained. A core was obtained. Final imaging was performed.  Patient tolerated the procedure well without complication. Vital sign monitoring by nursing staff during the procedure will continue as patient is in the special procedures unit for post procedure observation.  FINDINGS: The images document guide needle placement within the right iliac bone via posterior approach. Post biopsy images demonstrate no hemorrhage.  COMPLICATIONS: None.  IMPRESSION: Successful CT-guided bone marrow aspirate and core.   Electronically Signed   By: Marybelle Killings M.D.   On: 02/27/2015 12:48      ASSESSMENT: 69 y.o. [place] man with a history of well-differentiated lymphocytic lymphoma/ chronic lymphoid leukemia initially  diagnosed in 2000, not requiring intervention until 2006; with multiple chromosomal abnormalities.  His treatment history is as follows:  (1) fludarabine/cyclophosphamide/rituximab x5 completed May 2007.   (2) rituximab for 8 doses October 2010, with partial response   (3) Leustatin and ofatumumab weekly x8 July to September 2011 followed by maintenance ofatumumab  every 2 months, with initial response but rising counts September 2012   (4) status-post unrelated donor stem-cell transplant 02/24/2012 at the [facility]  (a) conditioning regimen consisted of fludarabine + TBI at 200 cGy, followed by rituximab x27;  (b) CMV reactivation x3 (patient CMV positive, donor negative), s/p ganciclovir treatment; 3d reactivation August 2013, s/p gancyclovir, with negative PCR mid-September 2013; last gancyclovir dose 10/06/2012 (c) Chronic GVHD: involving gut and skin, treated with steroids, tacrolimus and MMF.  MMF was eventually d/c'd and tacrolimus currently at a dose of 1.26m BID (d) atrial fibrillation: resolved on brief amiodarone regimen (e) steroid-induced myopathy: improving  (f) hypomagnesemia: improved after d/c gancyclovir, needs continuing support (g) hypogammaglobulinemia: requiring IVIG most recently 08/03/2014. (h) history of elevated triglycerides (606 on 07/14/2012)  (i) adrenal insufficiency: on prednisone and budesonide (j) pancytopenia,resolved (k) brief episode of neutropenia ([id number]) February 2015, accompanied by diarrhea; resolved   (5) restaging studies February-March 2015 including CT scans, flow cytometry, and bone marrow biopsy, showed no evidence of residual chronic lymphoid leukemia.  (6) recurrent GVHD (skin rash, mouth changes, severe diarrhea and gastric/duodenal/colonic biopsies 11/17/2012 c/w GVHD grade 2) : now grade 1 to  inactive  (7)  malnutrition -- on VITAL supplement in addition to regular diet; on Marinol for anorexia  (8) testosterone deficiency--on patch   (9) deconditioning: Especially quad weakness; continuing rehabilitation exercises  (10) mild dehydration: encouraged increased po fluids; receives IVF support w magnesium weekly  (11) severe steroid-induced osteoporosis with compression fractures: received pamidronate 12/18/2012. Status post kyphoplasty at L3-4 in June 2014. Also with evidence of rib fractures and insufficiency fractures bilaterally of the sacral  alae, noted by CT in March 2015. --   Denosumab started 12/08/2013, given as prolia Q6 months which is what has been approved by his insurance, most recent dose 10/02/2014  (12) chronic back pain and hip pain controlled with OxyContin and hydrocodone/APAP.  (12) nausea: well controlled on current meds  (13)  Positive c.diff, 03/08/2013, on Flagyl 500 mg TID x 20 days, then on oral vanco with Questran, showing improvement; positive when repeated April 2014; Negative x 3 since then  (14) persistently increased BUN and potassium  (15)  Hypertension, on labetalol, cardizem, lisinopril, and furosemide; managed by Dr. SBrigitte Pulse (16) steroid induced hyperglycemia, on sterlix and 70/30 insulin, followed by Dr. ELoanne Drillingand Dr. SBrigitte Pulse (17) hypogammaglobulinemia-- requiring intermittent supplementation, most recent dose 10/19/2014  (18) squamous cell CA in situ removed from left parietal scalp October 2014, second lesion to be removed may 2015   PLAN: Besides the swelling to his lower extremities, TJancarlois doing well today. The labs were reviewed in detail and were stable. His potassium is normal at 4.8 and his magensium is 1.7. Despite the extra lasix, his creatinine is stable at 1.3. He will proceed with IV fluids plus magnesiumm as planned today. There is no plan to make changes to his prednisone or tacrolimus at this time.   We discussed  interventions for his lower extremity edema. At this time it stops just below the knee. I advised him to strongly consider compression stocking but he declined. He has had some before and were uncomfortable.  At the least he will continue to avoid salt and elevate his legs whenever he is not walking.   Darnel will continue with his fluids with magnesium weekly and his office visits every 2 weeks. He understands and agrees with this plan. He has has been encouraged to call with any issues that might arise before his next visit here.  Laurie Panda, NP 03/14/2015

## 2015-03-21 ENCOUNTER — Ambulatory Visit (HOSPITAL_BASED_OUTPATIENT_CLINIC_OR_DEPARTMENT_OTHER): Payer: BC Managed Care – PPO

## 2015-03-21 ENCOUNTER — Other Ambulatory Visit (HOSPITAL_BASED_OUTPATIENT_CLINIC_OR_DEPARTMENT_OTHER): Payer: BC Managed Care – PPO

## 2015-03-21 DIAGNOSIS — C9110 Chronic lymphocytic leukemia of B-cell type not having achieved remission: Secondary | ICD-10-CM

## 2015-03-21 LAB — CBC WITH DIFFERENTIAL/PLATELET
BASO%: 0 % (ref 0.0–2.0)
Basophils Absolute: 0 10*3/uL (ref 0.0–0.1)
EOS%: 0.4 % (ref 0.0–7.0)
Eosinophils Absolute: 0 10*3/uL (ref 0.0–0.5)
HCT: 37.3 % — ABNORMAL LOW (ref 38.4–49.9)
HEMOGLOBIN: 12.3 g/dL — AB (ref 13.0–17.1)
LYMPH%: 28.3 % (ref 14.0–49.0)
MCH: 31.1 pg (ref 27.2–33.4)
MCHC: 33 g/dL (ref 32.0–36.0)
MCV: 94.2 fL (ref 79.3–98.0)
MONO#: 0.7 10*3/uL (ref 0.1–0.9)
MONO%: 12.9 % (ref 0.0–14.0)
NEUT#: 3 10*3/uL (ref 1.5–6.5)
NEUT%: 58.4 % (ref 39.0–75.0)
Platelets: 128 10*3/uL — ABNORMAL LOW (ref 140–400)
RBC: 3.96 10*6/uL — ABNORMAL LOW (ref 4.20–5.82)
RDW: 16.1 % — ABNORMAL HIGH (ref 11.0–14.6)
WBC: 5.1 10*3/uL (ref 4.0–10.3)
lymph#: 1.5 10*3/uL (ref 0.9–3.3)

## 2015-03-21 LAB — COMPREHENSIVE METABOLIC PANEL (CC13)
ALBUMIN: 3.6 g/dL (ref 3.5–5.0)
ALT: 35 U/L (ref 0–55)
ANION GAP: 13 meq/L — AB (ref 3–11)
AST: 30 U/L (ref 5–34)
Alkaline Phosphatase: 139 U/L (ref 40–150)
BUN: 48 mg/dL — ABNORMAL HIGH (ref 7.0–26.0)
CALCIUM: 8.7 mg/dL (ref 8.4–10.4)
CO2: 17 mEq/L — ABNORMAL LOW (ref 22–29)
Chloride: 113 mEq/L — ABNORMAL HIGH (ref 98–109)
Creatinine: 1.5 mg/dL — ABNORMAL HIGH (ref 0.7–1.3)
EGFR: 47 mL/min/{1.73_m2} — AB (ref >90)
GLUCOSE: 159 mg/dL — AB (ref 70–140)
Potassium: 4.7 mEq/L (ref 3.5–5.1)
Sodium: 143 mEq/L (ref 136–145)
Total Bilirubin: 0.44 mg/dL (ref 0.20–1.20)
Total Protein: 5.6 g/dL — ABNORMAL LOW (ref 6.4–8.3)

## 2015-03-21 LAB — MAGNESIUM (CC13): Magnesium: 1.9 mg/dl (ref 1.5–2.5)

## 2015-03-21 MED ORDER — SODIUM CHLORIDE 0.9 % IJ SOLN
10.0000 mL | INTRAMUSCULAR | Status: DC | PRN
  Administered 2015-03-21: 10 mL via INTRAVENOUS
  Filled 2015-03-21: qty 10

## 2015-03-21 MED ORDER — HEPARIN SOD (PORK) LOCK FLUSH 100 UNIT/ML IV SOLN
500.0000 [IU] | Freq: Once | INTRAVENOUS | Status: AC | PRN
  Administered 2015-03-21: 500 [IU]
  Filled 2015-03-21: qty 5

## 2015-03-21 MED ORDER — SODIUM CHLORIDE 0.9 % IV SOLN
1.0000 g | Freq: Once | INTRAVENOUS | Status: AC
  Administered 2015-03-21: 1 g via INTRAVENOUS
  Filled 2015-03-21: qty 2

## 2015-03-21 NOTE — Patient Instructions (Signed)
Hypomagnesemia Magnesium is a common ion (mineral) in the body which is needed for metabolism. It is about how the body handles food and other chemical reactions necessary for life. Only about 2% of the magnesium in our body is found in the blood. When this is low, it is called hypomagnesemia. The blood will measure only a tiny amount of the magnesium in our body. When it is low in our blood, it does not mean that the whole body supply is low. The normal serum concentration ranges from 1.8-2.5 mEq/L. When the level gets to be less than 1.0 mEq/L, a number of problems begin to happen.  CAUSES   Receiving intravenous fluids without magnesium replacement.  Loss of magnesium from the bowel by nasogastric suction.  Loss of magnesium from nausea and vomiting or severe diarrhea. Any of the inflammatory bowel conditions can cause this.  Abuse of alcohol often leads to low serum magnesium.  An inherited form of magnesium loss happens when the kidneys lose magnesium. This is called familial or primary hypomagnesemia.  Some medications such as diuretics also cause the loss of magnesium. SYMPTOMS  These following problems are worse if the changes in magnesium levels come on suddenly.  Tremor.  Confusion.  Muscle weakness.  Oversensitive to sights and sounds.  Sensitive reflexes.  Depression.  Muscular fibrillations.  Overreactivity of the nerves.  Irritability.  Psychosis.  Spasms of the hand muscles.  Tetany (where the muscles go into uncontrollable spasms). DIAGNOSIS  This condition can be diagnosed by blood tests. TREATMENT   In an emergency, magnesium can be given intravenously (by vein).  If the condition is less worrisome, it can be corrected by diet. High levels of magnesium are found in green leafy vegetables, peas, beans, and nuts among other things. It can also be given through medications by mouth.  If it is being caused by medications, changes can be made.  If  alcohol is a problem, help is available if there are difficulties giving it up. Document Released: 09/03/2005 Document Revised: 04/24/2014 Document Reviewed: 07/28/2008 [employer] Patient Information 2015 [employer]. This information is not intended to replace advice given to you by your health care provider. Make sure you discuss any questions you have with your health care provider.  

## 2015-03-22 ENCOUNTER — Other Ambulatory Visit: Payer: Self-pay | Admitting: Gastroenterology

## 2015-03-27 NOTE — Telephone Encounter (Signed)
error 

## 2015-03-29 ENCOUNTER — Telehealth: Payer: Self-pay | Admitting: *Deleted

## 2015-03-29 ENCOUNTER — Telehealth: Payer: Self-pay | Admitting: Oncology

## 2015-03-29 ENCOUNTER — Ambulatory Visit (HOSPITAL_BASED_OUTPATIENT_CLINIC_OR_DEPARTMENT_OTHER): Payer: BC Managed Care – PPO

## 2015-03-29 ENCOUNTER — Ambulatory Visit (HOSPITAL_BASED_OUTPATIENT_CLINIC_OR_DEPARTMENT_OTHER): Payer: BC Managed Care – PPO | Admitting: Oncology

## 2015-03-29 ENCOUNTER — Other Ambulatory Visit: Payer: Self-pay | Admitting: Oncology

## 2015-03-29 ENCOUNTER — Other Ambulatory Visit (HOSPITAL_BASED_OUTPATIENT_CLINIC_OR_DEPARTMENT_OTHER): Payer: BC Managed Care – PPO

## 2015-03-29 VITALS — BP 147/72 | HR 63 | Temp 97.6°F | Resp 20 | Ht 66.0 in | Wt 218.2 lb

## 2015-03-29 DIAGNOSIS — M818 Other osteoporosis without current pathological fracture: Secondary | ICD-10-CM

## 2015-03-29 DIAGNOSIS — C9110 Chronic lymphocytic leukemia of B-cell type not having achieved remission: Secondary | ICD-10-CM | POA: Diagnosis not present

## 2015-03-29 DIAGNOSIS — T8609 Other complications of bone marrow transplant: Secondary | ICD-10-CM

## 2015-03-29 DIAGNOSIS — Z9489 Other transplanted organ and tissue status: Secondary | ICD-10-CM

## 2015-03-29 DIAGNOSIS — D849 Immunodeficiency, unspecified: Secondary | ICD-10-CM

## 2015-03-29 DIAGNOSIS — E875 Hyperkalemia: Secondary | ICD-10-CM

## 2015-03-29 DIAGNOSIS — G8929 Other chronic pain: Secondary | ICD-10-CM

## 2015-03-29 DIAGNOSIS — D899 Disorder involving the immune mechanism, unspecified: Secondary | ICD-10-CM

## 2015-03-29 DIAGNOSIS — M549 Dorsalgia, unspecified: Secondary | ICD-10-CM

## 2015-03-29 DIAGNOSIS — D89811 Chronic graft-versus-host disease: Secondary | ICD-10-CM

## 2015-03-29 DIAGNOSIS — E099 Drug or chemical induced diabetes mellitus without complications: Secondary | ICD-10-CM

## 2015-03-29 DIAGNOSIS — M810 Age-related osteoporosis without current pathological fracture: Secondary | ICD-10-CM

## 2015-03-29 DIAGNOSIS — T380X5A Adverse effect of glucocorticoids and synthetic analogues, initial encounter: Secondary | ICD-10-CM

## 2015-03-29 LAB — COMPREHENSIVE METABOLIC PANEL (CC13)
ALT: 24 U/L (ref 0–55)
AST: 23 U/L (ref 5–34)
Albumin: 3.5 g/dL (ref 3.5–5.0)
Alkaline Phosphatase: 115 U/L (ref 40–150)
Anion Gap: 11 mEq/L (ref 3–11)
BUN: 41.7 mg/dL — AB (ref 7.0–26.0)
CALCIUM: 8.9 mg/dL (ref 8.4–10.4)
CHLORIDE: 112 meq/L — AB (ref 98–109)
CO2: 18 mEq/L — ABNORMAL LOW (ref 22–29)
Creatinine: 1.3 mg/dL (ref 0.7–1.3)
EGFR: 54 mL/min/{1.73_m2} — AB (ref >90)
Glucose: 192 mg/dl — ABNORMAL HIGH (ref 70–140)
POTASSIUM: 4.7 meq/L (ref 3.5–5.1)
Sodium: 141 mEq/L (ref 136–145)
Total Bilirubin: 0.35 mg/dL (ref 0.20–1.20)
Total Protein: 5.5 g/dL — ABNORMAL LOW (ref 6.4–8.3)

## 2015-03-29 LAB — CBC WITH DIFFERENTIAL/PLATELET
BASO%: 0 % (ref 0.0–2.0)
Basophils Absolute: 0 10*3/uL (ref 0.0–0.1)
EOS ABS: 0 10*3/uL (ref 0.0–0.5)
EOS%: 0.2 % (ref 0.0–7.0)
HEMATOCRIT: 37.2 % — AB (ref 38.4–49.9)
HEMOGLOBIN: 12.3 g/dL — AB (ref 13.0–17.1)
LYMPH%: 24.9 % (ref 14.0–49.0)
MCH: 30.7 pg (ref 27.2–33.4)
MCHC: 33.1 g/dL (ref 32.0–36.0)
MCV: 92.8 fL (ref 79.3–98.0)
MONO#: 0.5 10*3/uL (ref 0.1–0.9)
MONO%: 9.9 % (ref 0.0–14.0)
NEUT#: 3.2 10*3/uL (ref 1.5–6.5)
NEUT%: 65 % (ref 39.0–75.0)
Platelets: 123 10*3/uL — ABNORMAL LOW (ref 140–400)
RBC: 4.01 10*6/uL — ABNORMAL LOW (ref 4.20–5.82)
RDW: 15.6 % — ABNORMAL HIGH (ref 11.0–14.6)
WBC: 4.9 10*3/uL (ref 4.0–10.3)
lymph#: 1.2 10*3/uL (ref 0.9–3.3)

## 2015-03-29 LAB — MAGNESIUM (CC13): MAGNESIUM: 1.5 mg/dL (ref 1.5–2.5)

## 2015-03-29 MED ORDER — SODIUM CHLORIDE 0.9 % IJ SOLN
10.0000 mL | Freq: Once | INTRAMUSCULAR | Status: AC
  Administered 2015-03-29: 10 mL via INTRAVENOUS
  Filled 2015-03-29: qty 10

## 2015-03-29 MED ORDER — ALTEPLASE 2 MG IJ SOLR
2.0000 mg | Freq: Once | INTRAMUSCULAR | Status: DC | PRN
  Filled 2015-03-29: qty 2

## 2015-03-29 MED ORDER — SODIUM CHLORIDE 0.9 % IV SOLN
Freq: Once | INTRAVENOUS | Status: AC
  Administered 2015-03-29: 10:00:00 via INTRAVENOUS

## 2015-03-29 MED ORDER — DENOSUMAB 60 MG/ML ~~LOC~~ SOLN
60.0000 mg | Freq: Once | SUBCUTANEOUS | Status: AC
Start: 2015-03-29 — End: 2015-03-29
  Administered 2015-03-29: 60 mg via SUBCUTANEOUS
  Filled 2015-03-29: qty 1

## 2015-03-29 MED ORDER — HEPARIN SOD (PORK) LOCK FLUSH 100 UNIT/ML IV SOLN
250.0000 [IU] | Freq: Once | INTRAVENOUS | Status: DC | PRN
  Filled 2015-03-29: qty 5

## 2015-03-29 MED ORDER — ONDANSETRON 8 MG/50ML IVPB (CHCC): 8.0000 mg | Freq: Once | INTRAVENOUS | Status: DC

## 2015-03-29 MED ORDER — PROMETHAZINE HCL 25 MG/ML IJ SOLN
12.5000 mg | Freq: Once | INTRAMUSCULAR | Status: AC
  Administered 2015-03-29: 12.5 mg via INTRAVENOUS
  Filled 2015-03-29: qty 1

## 2015-03-29 MED ORDER — SODIUM CHLORIDE 0.9 % IV SOLN
1.0000 g | Freq: Once | INTRAVENOUS | Status: AC
  Administered 2015-03-29: 1 g via INTRAVENOUS
  Filled 2015-03-29: qty 2

## 2015-03-29 MED ORDER — PREDNISONE 10 MG PO TABS: 10.0000 mg | ORAL_TABLET | Freq: Every day | ORAL | Status: DC

## 2015-03-29 MED ORDER — HEPARIN SOD (PORK) LOCK FLUSH 100 UNIT/ML IV SOLN
500.0000 [IU] | Freq: Once | INTRAVENOUS | Status: AC | PRN
  Administered 2015-03-29: 500 [IU]
  Filled 2015-03-29: qty 5

## 2015-03-29 MED ORDER — SODIUM CHLORIDE 0.9 % IV SOLN
Freq: Once | INTRAVENOUS | Status: AC
  Administered 2015-03-29: 10:00:00 via INTRAVENOUS
  Filled 2015-03-29: qty 4

## 2015-03-29 MED ORDER — SERTRALINE HCL 50 MG PO TABS: 50.0000 mg | ORAL_TABLET | Freq: Every morning | ORAL | Status: AC

## 2015-03-29 MED ORDER — FUROSEMIDE 20 MG PO TABS: ORAL_TABLET | ORAL | Status: DC

## 2015-03-29 NOTE — Progress Notes (Signed)
ID: Timothy [name]   DOB: 1946/01/15  MR#: [id number]  OHF#:[id number]  MCE:YEMV, Timothy Saxon, MD SU: OTHER MD: Timothy [name]; Timothy Shin, MD; [name], Timothy Hatcher,MD; Timothy Rosenthal, MD; [facility] in [place], [place]:  Timothy Duval, RN [phone]), Timothy [name] M.D.  CHIEF COMPLAINT:  CLL, status post allogeneic stem cell transplant, GVHD CURRENT TREATMENT: Immunosuppression, supportive care  HISTORY OF CLL: From the original intake note:  We have very complete records from Dr. Racheal Patches in [place], and in summary:  The patient was initially diagnosed in August 2000, with a white cell count of 23,600, but normal hemoglobin and platelets, and no significant symptomatology. Over the next several years his white cell count drifted up, and he eventually developed some symptoms of night sweats in particular, leading to treatment with fludarabine, Cytoxan and rituxan for five cycles given between December 2006 and May 2007.  We have CT scans from June 2006, November 2006 and April 2007, and comparing the November 2006 and April 2007 scans, there was near complete response. He had subsequent therapy in [place] as detailed below, but with decreased response, leading to allogeneic stem-cell transplant at [facility] 02/24/2012.  Subsequent history is as detailed below.  INTERVAL HISTORY: Timothy [name] returns today for follow up of his chronic lymphoid leukemia and graft-versus-host disease accompanied his wife, Timothy [name].  Since his last visit here he had a colonoscopy under Timothy [name]. There were several polyps which were not malignant. There was no obvious graft-versus-host disease. He tolerated the procedure well. He has had no bowel movements in the last 24 hours as a result of his "being cleaned out".  REVIEW OF SYSTEMS: Timothy [name] feels "well". He was able to walk 6000 steps yesterday, which is the most he has been able to walk since he  started keeping tabs. He is standing up from a sitting position more easily, without having to push off as vigorously as before. He complains of ankle swelling, and Dr. Brigitte [name] has instructed him to get compression stockings for this. He sugars have been running between the 80s in the morning and 260s in the evening. His he does not recall his A1C and I do not have a note from Dr. Brigitte [name] regarding that. Otherwise a detailed review of systems today was entirely stable. He will be done with this semester at the end of April and is thinking of perhaps taking a brief trip, possibly to [place], with Timothy [name] "just for a break".  PAST MEDICAL HISTORY: Past Medical History  Diagnosis Date  . 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 Diverticular disease   . Hyperlipidemia   . Obesity   . Hypertension   . Hiatal hernia   . CMV (cytomegalovirus) antibody positive     pre-transplant, with seroconversion x2 pst-transplant  . Right bundle branch block     pre-transplant  . CKD (chronic kidney disease) stage 2, GFR 60-89 ml/min   . Pancytopenia   . Atrial fibrillation     post-transplant  . Myopathy   . 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 Diarrhea in adult patient 12/05/2012    Due to active GVHD  . Rash of face 12/05/2012    Due to GVHD  . Hypomagnesemia 01/26/2013  . Left hip pain 12/01/2013  . Steroid-induced diabetes     novalog  . Leukemia,  chronic lymphoid   . CLL (chronic lymphocytic leukemia) 12/05/2012    Dx 07/1999; started Rx 12/06  AlloBMT 3/13    PAST SURGICAL HISTORY: Past Surgical History  Procedure Laterality Date  . Tonsillectomy and adenoidectomy    . Bone marrow transplant    . Flexible sigmoidoscopy  11/17/2012    Procedure: FLEXIBLE SIGMOIDOSCOPY;  Surgeon: Timothy Columbia, MD;  Location: [facility];  Service: Endoscopy;  Laterality:  N/A;  Dr Timothy [name] states will be admitted to rooom 1339 11/16/12  . Esophagogastroduodenoscopy  11/17/2012    Procedure: ESOPHAGOGASTRODUODENOSCOPY (EGD);  Surgeon: Timothy Columbia, MD;  Location: [facility];  Service: Endoscopy;  Laterality: N/A;    FAMILY HISTORY Family History  Problem Relation Age of Onset  . Cancer Father   The patient's father died from complications of chronic lymphocytic leukemia at the age of 38.  It had been diagnosed seven years before when he was 1.  The patient is enrolled in a familial chronic lymphocytic leukemia study out of the [employer].  The patient's mother is 1, alive, unfortunately suffering with dementia, and he has a brother, 41, who is otherwise in fair health.   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  His wife of >40 years, Timothy [name], is a homemaker.  Their daughter, Timothy [name], lives in [place].  She also is a Agricultural engineer.  The patient has an 43 year old grandson and an 59-year-old granddaughter, and that is really the main reason he moved to this area.  He is a Tourist information centre manager.     ADVANCED DIRECTIVES: In place  HEALTH MAINTENANCE: (Updated 04/13/2014) History  Substance Use Topics  . Smoking status: Never Smoker   . Smokeless tobacco: Never Used  . Alcohol Use: No     Colonoscopy: Nov 2013, Timothy [name]  PSA: Not on file  Bone density:  Feb 2014;  Patient also has known insufficiency and pathologic fractures in addition to his long-standing history of steroid use.  Lipid panel: Jan 2015, elevated    Allergies  Allergen Reactions  . Benadryl [Diphenhydramine Hcl]     "Restless leg syndrome"    Current Outpatient Prescriptions  Medication Sig Dispense Refill  . acyclovir (ZOVIRAX) 400 MG tablet TAKE 2 TABLETS BY  MOUTH TWICE DAILY 120 tablet 1  . budesonide (ENTOCORT EC) 3 MG 24 hr capsule Take 1 capsule (3 mg total) by mouth 3 (three) times daily. 90 capsule 6  . CARTIA XT 240 MG 24 hr capsule TAKE 1 CAPSULE BY MOUTH ONCE DAILY (Patient taking differently: take 1 capsule at bedtime) 30 capsule 3  . cholestyramine (QUESTRAN) 4 G packet Take 1 packet (4 g total) by mouth 2 (two) times daily. 60 each 12  . fluconazole (DIFLUCAN) 100 MG tablet Take 1 tablet (100 mg total) by mouth once. (Patient taking differently: Take 100 mg by mouth daily. ) 30 tablet 3  . furosemide (LASIX) 20 MG tablet Take 1 tablet (20 mg total) by mouth 2 (two) times daily. 60 tablet 1  . glucose blood (ONE TOUCH ULTRA TEST) test strip Test before meals and at bedtime. 300 each 2  . insulin aspart (NOVOLOG) 100 unit/mL injection Inject 10 Units into the skin 3 (three) times daily before meals.    . Insulin Detemir (LEVEMIR) 100 UNIT/ML Pen Inject  47 Units into the skin daily.     . Insulin Pen Needle (B-D UF III MINI PEN NEEDLES) 31G X 5 MM MISC Use twice daily with insulin as directed 100 each 5  . labetalol (NORMODYNE) 200 MG tablet Take 2 tablets (400 mg total) by mouth 2 (two) times daily. 120 tablet 1  . Lidocaine-Hydrocortisone Ace 3-0.5 % KIT Apply 1 application topically as needed (for pain). 1 each 3  . lidocaine-prilocaine (EMLA) cream Apply 1 application topically as needed. 1-2 hrs before each port access 30 g 6  . lisinopril (PRINIVIL,ZESTRIL) 10 MG tablet Take 10 mg by mouth daily.     Marland Kitchen loratadine (CLARITIN) 10 MG tablet Take 10 mg by mouth daily.    Marland Kitchen omeprazole (PRILOSEC) 20 MG capsule Take 1 capsule (20 mg total) by mouth daily. 30 capsule 5  . ONETOUCH DELICA LANCETS 16P MISC TEST BEFORE MEALS AND AT BEDTIME. 300 each 0  . predniSONE (DELTASONE) 10 MG tablet Take 31m in the morning with breakfast (Patient taking differently: Take by mouth daily with breakfast. ) 30 tablet 1  . sertraline (ZOLOFT) 50 MG tablet  ALTERNATE TAKING 1 TABLET DAILY AND 2 TABLETS DAILY (Patient taking differently: Take 50 mg by mouth every morning. ) 90 tablet 5  . sodium bicarbonate 325 MG tablet Take 650 mg by mouth 2 (two) times daily.     .Marland Kitchensulfamethoxazole-trimethoprim (BACTRIM DS,SEPTRA DS) 800-160 MG per tablet Take 1 tablet by mouth daily. 30 tablet 5  . tacrolimus (PROGRAF) 0.5 MG capsule Take 3 capsules (1.5 mg total) by mouth 2 (two) times daily. 180 capsule 4  . VOLTAREN 1 % GEL Apply 2 g topically 2 (two) times daily. Applied to back    . [DISCONTINUED] insulin aspart (NOVOLOG FLEXPEN) 100 UNIT/ML SOPN FlexPen 18units sq qam, 9units sq qpm, or as directed 15 mL 1   No current facility-administered medications for this visit.   Facility-Administered Medications Ordered in Other Visits  Medication Dose Route Frequency Provider Last Rate Last Dose  . 0.9 %  sodium chloride infusion   Intravenous Continuous Amy G Berry, PA-C 500 mL/hr at 03/12/13 0900    . sodium chloride 0.9 % injection 10 mL  10 mL Intravenous PRN GChauncey Cruel MD   10 mL at 08/11/12 1606    OBJECTIVE: Middle-aged white man in no acute distress Filed Vitals:   03/29/15 0825  BP: 147/72  [name]: 63  Temp: 97.6 F (36.4 C)  Resp: 20  Body mass index is 35.24 kg/(m^2).  ECOG: 2 Filed Weights   03/29/15 0825  Weight: 218 lb 3.2 oz (98.975 kg)   Sclerae unicteric, pupils round and equal Oropharynx clear and moist-- no thrush or other lesions, no evidence of mucosal graft-versus-host No cervical or supraclavicular adenopathy Lungs no rales or rhonchi Heart regular rate and rhythm Abd soft, obese, nontender, positive bowel sounds MSK no focal spinal tenderness, bilateral ankle edema is improved, clearly grade 1 at present Neuro: nonfocal, well oriented, positive affect Skin: Multiple ecchymoses as before, but no evidence of "graft-versus-host rash"     LABS:  CBC    Component Value Date/Time   WBC 5.1 03/21/2015 1228   WBC  3.2* 02/27/2015 0805   RBC 3.96* 03/21/2015 1228   RBC 3.64* 02/27/2015 0805   RBC 3.82* 03/16/2013 1400   HGB 12.3* 03/21/2015 1228   HGB 11.3* 02/27/2015 0805   HCT 37.3* 03/21/2015 1228   HCT 34.1* 02/27/2015 0805   PLT 128*  03/21/2015 1228   PLT 120* 02/27/2015 0805   MCV 94.2 03/21/2015 1228   MCV 93.7 02/27/2015 0805   MCH 31.1 03/21/2015 1228   MCH 31.0 02/27/2015 0805   MCHC 33.0 03/21/2015 1228   MCHC 33.1 02/27/2015 0805   RDW 16.1* 03/21/2015 1228   RDW 16.2* 02/27/2015 0805   LYMPHSABS 1.5 03/21/2015 1228   LYMPHSABS 1.4 03/18/2013 0615   MONOABS 0.7 03/21/2015 1228   MONOABS 0.3 03/18/2013 0615   EOSABS 0.0 03/21/2015 1228   EOSABS 0.0 03/18/2013 0615   BASOSABS 0.0 03/21/2015 1228   BASOSABS 0.0 03/18/2013 0615       Chemistry      Component Value Date/Time   NA 143 03/21/2015 1229   NA 137 02/27/2015 0805   K 4.7 03/21/2015 1229   K 4.8 02/27/2015 0805   CL 117* 02/27/2015 0805   CL 101 06/15/2013 1034   CO2 17* 03/21/2015 1229   CO2 18* 02/27/2015 0805   BUN 48.0* 03/21/2015 1229   BUN 40* 02/27/2015 0805   CREATININE 1.5* 03/21/2015 1229   CREATININE 1.44* 02/27/2015 0805      Component Value Date/Time   CALCIUM 8.7 03/21/2015 1229   CALCIUM 8.2* 02/27/2015 0805   ALKPHOS 139 03/21/2015 1229   ALKPHOS 80 09/07/2014 1324   AST 30 03/21/2015 1229   AST 40* 09/07/2014 1324   ALT 35 03/21/2015 1229   ALT 55* 09/07/2014 1324   BILITOT 0.44 03/21/2015 1229   BILITOT <0.2* 09/07/2014 1324     STUDIES: Ct Biopsy  02/27/2015   CLINICAL DATA:  Leukemia  EXAM: CT-GUIDED BONE MARROW ASPIRATE AND CORE BIOPSY.  MEDICATIONS AND MEDICAL HISTORY: Versed 0 mg, Fentanyl 100 mcg.  Additional Medications: None.  ANESTHESIA/SEDATION: Moderate sedation time: 15 minutes  PROCEDURE: The procedure, risks, benefits, and alternatives were explained to the patient. Questions regarding the procedure were encouraged and answered. The patient understands and consents to  the procedure.  The back was prepped with Betadine in a sterile fashion, and a sterile drape was applied covering the operative field. A sterile gown and sterile gloves were used for the procedure.  Under CT guidance, an 11 gauge needle was inserted into the right iliac bone and aspirates were obtained. A core was obtained. Final imaging was performed.  Patient tolerated the procedure well without complication. Vital sign monitoring by nursing staff during the procedure will continue as patient is in the special procedures unit for post procedure observation.  FINDINGS: The images document guide needle placement within the right iliac bone via posterior approach. Post biopsy images demonstrate no hemorrhage.  COMPLICATIONS: None.  IMPRESSION: Successful CT-guided bone marrow aspirate and core.   Electronically Signed   By: Marybelle Killings M.D.   On: 02/27/2015 12:48   Patient: Timothy [name], Timothy [name] Collected: 03/22/2015 Client: [place] Accession: [id number] Received: 03/26/2015 Clarene Essex, MD DOB: 06/12/1946 Age: 25 Gender: M Reported: 03/27/2015 [address] Patient Ph: MRN #[place], [place] [id number] Client Acc#: [phone] Chart #: [id number] Phone: [phone] Fax: CC: REPORT OF SURGICAL PATHOLOGY FINAL DIAGNOSIS Diagnosis 1. Surgical [P], terminal ileum colon/cecum colon, ? polyp, excision - FINDINGS CONSISTENT WITH INFLAMMATORY POLYP. - SEE MICROSCOPIC DESCRIPTION. 2. Surgical [P], random colon, biopsy - UNREMARKABLE COLONIC MUCOSA. - NO SIGNIFICANT INFLAMMATION OR OTHER ABNORMALITIES IDENTIFIED. 3. Surgical [P], transverse/descending/sigmoid colon, polyp, excision - TUBULAR ADENOMAS. - NO HIGH GRADE DYSPLASIA OR MALIGNANCY IDENTIFIED. 4. Surgical [P], descending colon, ? polyp - FINDINGS CONSISTENT WITH INFLAMMATORY POLYP. -  SEE MICROSCOPIC DESCRIPTION. Microscopic Comment 1. There is an ileal fragment with fibrosis in the stroma and slight inflammation. No granulomas are  identified. 4. There is colonic mucosa with active inflammation and focal erosion and the features are consistent with an inflammation polyp. No granulomas are identified. Claudette Laws MD Pathologist, Electronic Signature (Case signed 03/27/2015)  ASSESSMENT: 69 y.o. [place] man with a history of well-differentiated lymphocytic lymphoma/ chronic lymphoid leukemia initially diagnosed in 2000, not requiring intervention until 2006; with multiple chromosomal abnormalities.  His treatment history is as follows:  (1) fludarabine/cyclophosphamide/rituximab x5 completed May 2007.   (2) rituximab for 8 doses October 2010, with partial response   (3) Leustatin and ofatumumab weekly x8 July to September 2011 followed by maintenance ofatumumab  every 2 months, with initial response but rising counts September 2012   (4) status-post unrelated donor stem-cell transplant 02/24/2012 at [facility]  (a) conditioning regimen consisted of fludarabine + TBI at 200 cGy, followed by rituximab x27;  (b) CMV reactivation x3 (patient CMV positive, donor negative), s/p ganciclovir treatment; 3d reactivation August 2013, s/p gancyclovir, with negative PCR mid-September 2013; last gancyclovir dose 10/06/2012 (c) Chronic GVHD: involving gut and skin, treated with steroids, tacrolimus and MMF.  MMF was eventually d/c'd and tacrolimus currently at a dose of 1.54m BID (d) atrial fibrillation: resolved on brief amiodarone regimen (e) steroid-induced myopathy: improving  (f) hypomagnesemia: improved after d/c gancyclovir, needs continuing support (g) hypogammaglobulinemia: requiring IVIG most recently 08/03/2014. (h) history of elevated triglycerides (606 on 07/14/2012)  (i) adrenal insufficiency: on prednisone and budesonide (j) pancytopenia,resolved (k) brief episode of neutropenia (AOrient300) February 2015, accompanied by diarrhea; resolved   (5) restaging studies February-March 2015 including CT scans, flow  cytometry, and bone marrow biopsy, showed no evidence of residual chronic lymphoid leukemia.  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 Status post kyphoplasty at L3-4 in June 2014. Also with evidence of rib fractures and insufficiency fractures bilaterally of the sacral  alae, noted by CT in March 2015. --   Denosumab started 12/08/2013, given as prolia Q6 months which is what has been approved by his insurance, most recent dose 10/02/2014  (12) chronic back pain and hip pain controlled with OxyContin and hydrocodone/APAP.  (12) nausea: well controlled on current meds  (13)  Positive c.diff, 03/08/2013, on Flagyl 500 mg TID x 20 days, then on oral vanco with Questran, showing improvement; positive when repeated April 2014; Negative x 3 since then  (14) persistently increased BUN and potassium  (15)  Hypertension, on labetalol, cardizem, lisinopril, and furosemide; managed by Dr. SBrigitte [name] (16) steroid induced hyperglycemia, on sterlix and 70/30 insulin, followed by Dr. ELoanne Drillingand Dr. SBrigitte [name] (17) hypogammaglobulinemia-- requiring intermittent supplementation, most recent dose 10/19/2014  (18) squamous cell CA in situ removed from left parietal scalp October 2014, second lesion to be removed may 2015   PLAN: Sade's colonoscopy did not show graft-versus-host as a major problem. (I have not had a chance to sit down with Dr. PSaralyn Pilarto look over the slides). His diarrhea  is about the  same as before except that right now he is "catching up" from the colonoscopy and he has not had a bowel movement in 24 hour's.  We are going to let him return to his normal pattern over the next week and then starting on 418 I have asked him to try to drop his prednisone to 5 mg in the morning and 2-1/2 in the evening. If we can get away with that for several weeks without any worsening of his symptoms we will try to go to 5 mg in the morning only.  In the meantime I have reinforced Dr. Raul Del instructions for him to use compression stockings. We went over the pathophysiology of venous insufficiency and of course is very smart and was immediately able to grasp the pressure issues. He is "going to give it a try".  When going to continue weekly intravenous fluids with magnesium and visits every 2 weeks. The long-term goal is to first try to get him off the oral steroids and then try to lower the Prograf dose. Every time we have tried to do that however we have had worsening symptoms so we will have to progress very slowly.  The good news is that there is no evidence of leukemia and that he is stable to improved in his functional status. He knows to call for any problems that may develop before his next visit here. Chauncey Cruel, MD 03/29/2015

## 2015-03-29 NOTE — Patient Instructions (Signed)
Magnesium This is a blood test which measures the amount of magnesium in your blood. Most of the magnesium in your body exists in your cells. However, the magnesium in your blood is important for many processes. It is important for your nerves to be able to conduct electrical energy. This is important in heart patients with higher heartbeats. When your heart beats and then gets ready to beat again, it repolarizes. If the magnesium levels are low or high, it can affect the accuracy of the repolarization process. The magnesium levels are also monitored during pregnancy. This is done to determine if the expectant mother may have preeclampsia or toxemia. Magnesium is often used for treatment of these problems. PREPARATION FOR TEST No preparation or fasting is needed. A blood sample may be taken by inserting a needle into a vein in the arm.  NORMAL FINDINGS  Adult: 1.3 to 2.1 mEq/L or 0.65 to 1.05 mmol/L (SI units)  Child: 1.4 to 1.7 mEq/L  Newborn: 1.4 to 2 mEq/L Possible critical values: less than 0.5 mEq/L or greater than 3 mEq/L Ranges for normal findings may vary among different laboratories and hospitals. You should always check with your caregiver after having lab work or other tests done to discuss the meaning of your test results and whether your values are considered within normal limits. MEANING OF TEST  Your caregiver will go over the test results with you. Your caregiver will discuss the importance and meaning of your results. He or she will also discuss treatment options and additional tests, if needed. OBTAINING THE TEST RESULTS It is your responsibility to obtain your test results. Ask the lab or department performing the test when and how you will get your results. Document Released: 01/10/2005 Document Revised: 03/01/2012 Document Reviewed: 11/18/2008 [facility] Patient Information 2015 [place], [place]. This information is not intended to replace advice given to you by your health care  provider. Make sure you discuss any questions you have with your health care provider.

## 2015-03-29 NOTE — Telephone Encounter (Signed)
Per staff message and POF I have scheduled appts. Advised scheduler of appts. JMW  

## 2015-03-29 NOTE — Telephone Encounter (Signed)
per pof to sch pt appt-sent MW email to sch pt IV appt-pt to get updated copy b4 leaving

## 2015-04-05 ENCOUNTER — Other Ambulatory Visit (HOSPITAL_COMMUNITY)
Admission: RE | Admit: 2015-04-05 | Discharge: 2015-04-05 | Disposition: A | Discharge status: Home / Self Care | Payer: BC Managed Care – PPO | Source: Ambulatory Visit | Attending: Oncology | Admitting: Oncology

## 2015-04-05 ENCOUNTER — Other Ambulatory Visit (HOSPITAL_BASED_OUTPATIENT_CLINIC_OR_DEPARTMENT_OTHER): Payer: BC Managed Care – PPO

## 2015-04-05 ENCOUNTER — Ambulatory Visit (HOSPITAL_BASED_OUTPATIENT_CLINIC_OR_DEPARTMENT_OTHER): Payer: BC Managed Care – PPO

## 2015-04-05 ENCOUNTER — Encounter: Payer: Self-pay | Admitting: *Deleted

## 2015-04-05 DIAGNOSIS — C9110 Chronic lymphocytic leukemia of B-cell type not having achieved remission: Secondary | ICD-10-CM | POA: Insufficient documentation

## 2015-04-05 LAB — CBC WITH DIFFERENTIAL/PLATELET
BASO%: 0 % (ref 0.0–2.0)
BASOS ABS: 0 10*3/uL (ref 0.0–0.1)
EOS ABS: 0 10*3/uL (ref 0.0–0.5)
EOS%: 0 % (ref 0.0–7.0)
HEMATOCRIT: 37.7 % — AB (ref 38.4–49.9)
HEMOGLOBIN: 12.2 g/dL — AB (ref 13.0–17.1)
LYMPH%: 27.3 % (ref 14.0–49.0)
MCH: 30.7 pg (ref 27.2–33.4)
MCHC: 32.4 g/dL (ref 32.0–36.0)
MCV: 94.7 fL (ref 79.3–98.0)
MONO#: 0.4 10*3/uL (ref 0.1–0.9)
MONO%: 9.1 % (ref 0.0–14.0)
NEUT%: 63.6 % (ref 39.0–75.0)
NEUTROS ABS: 2.7 10*3/uL (ref 1.5–6.5)
PLATELETS: 139 10*3/uL — AB (ref 140–400)
RBC: 3.98 10*6/uL — AB (ref 4.20–5.82)
RDW: 15.5 % — ABNORMAL HIGH (ref 11.0–14.6)
WBC: 4.3 10*3/uL (ref 4.0–10.3)
lymph#: 1.2 10*3/uL (ref 0.9–3.3)
nRBC: 0 % (ref 0–0)

## 2015-04-05 LAB — COMPREHENSIVE METABOLIC PANEL
ALT: 59 U/L — ABNORMAL HIGH (ref 0–53)
AST: 50 U/L — AB (ref 0–37)
Albumin: 3.7 g/dL (ref 3.5–5.2)
Alkaline Phosphatase: 108 U/L (ref 39–117)
Anion gap: 7 (ref 5–15)
BILIRUBIN TOTAL: 0.5 mg/dL (ref 0.3–1.2)
BUN: 45 mg/dL — ABNORMAL HIGH (ref 6–23)
CHLORIDE: 110 mmol/L (ref 96–112)
CO2: 22 mmol/L (ref 19–32)
CREATININE: 1.23 mg/dL (ref 0.50–1.35)
Calcium: 8.4 mg/dL (ref 8.4–10.5)
GFR calc Af Amer: 68 mL/min — ABNORMAL LOW (ref >90)
GFR, EST NON AFRICAN AMERICAN: 59 mL/min — AB (ref >90)
GLUCOSE: 224 mg/dL — AB (ref 70–99)
Potassium: 4.6 mmol/L (ref 3.5–5.1)
Sodium: 139 mmol/L (ref 135–145)
Total Protein: 5.8 g/dL — ABNORMAL LOW (ref 6.0–8.3)

## 2015-04-05 LAB — MAGNESIUM: Magnesium: 1.5 mg/dL (ref 1.5–2.5)

## 2015-04-05 MED ORDER — HEPARIN SOD (PORK) LOCK FLUSH 100 UNIT/ML IV SOLN
500.0000 [IU] | Freq: Once | INTRAVENOUS | Status: AC | PRN
  Administered 2015-04-05: 500 [IU]
  Filled 2015-04-05: qty 5

## 2015-04-05 MED ORDER — MAGNESIUM SULFATE 50 % IJ SOLN
1.0000 g | Freq: Once | INTRAMUSCULAR | Status: AC
  Administered 2015-04-05: 1 g via INTRAVENOUS
  Filled 2015-04-05: qty 2

## 2015-04-05 NOTE — Patient Instructions (Signed)
Magnesium Sulfate injection °What is this medicine? °MAGNESIUM SULFATE (mag NEE zee um SUL fate) is an electrolyte injection commonly used to treat low magnesium levels in your blood and to prevent or control certain seizures. °This medicine may be used for other purposes; ask your health care provider or pharmacist if you have questions. °What should I tell my health care provider before I take this medicine? °They need to know if you have any of these conditions: °-heart disease °-history of irregular heart beat °-kidney disease °-an unusual or allergic reaction to magnesium sulfate, medicines, foods, dyes, or preservatives °-pregnant or trying to get pregnant °-breast-feeding °How should I use this medicine? °This medicine is for infusion into a vein. It is given by a health care professional in a hospital or clinic setting. °Talk to your pediatrician regarding the use of this medicine in children. While this drug may be prescribed for selected conditions, precautions do apply. °Overdosage: If you think you have taken too much of this medicine contact a poison control center or emergency room at once. °NOTE: This medicine is only for you. Do not share this medicine with others. °What if I miss a dose? °This does not apply. °What may interact with this medicine? °This medicine may interact with the following medications: °-certain medicines for anxiety or sleep °-certain medicines for seizures like phenobarbital °-digoxin °-medicines that relax muscles for surgery °-narcotic medicines for pain °This list may not describe all possible interactions. Give your health care provider a list of all the medicines, herbs, non-prescription drugs, or dietary supplements you use. Also tell them if you smoke, drink alcohol, or use illegal drugs. Some items may interact with your medicine. °What should I watch for while using this medicine? °Your condition will be monitored carefully while you are receiving this medicine. You  may need blood work done while you are receiving this medicine. °What side effects may I notice from receiving this medicine? °Side effects that you should report to your doctor or health care professional as soon as possible: °-allergic reactions like skin rash, itching or hives, swelling of the face, lips, or tongue °-facial flushing °-muscle weakness °-signs and symptoms of low blood pressure like dizziness; feeling faint or lightheaded, falls; unusually weak or tired °-signs and symptoms of a dangerous change in heartbeat or heart rhythm like chest pain; dizziness; fast or irregular heartbeat; palpitations; breathing problems °-sweating °This list may not describe all possible side effects. Call your doctor for medical advice about side effects. You may report side effects to FDA at 1-800-FDA-1088. °Where should I keep my medicine? °This drug is given in a hospital or clinic and will not be stored at home. °NOTE: This sheet is a summary. It may not cover all possible information. If you have questions about this medicine, talk to your doctor, pharmacist, or health care provider. °© 2015, Elsevier/Gold Standard. (2013-04-18 10:35:11) ° °

## 2015-04-12 ENCOUNTER — Ambulatory Visit (HOSPITAL_BASED_OUTPATIENT_CLINIC_OR_DEPARTMENT_OTHER): Payer: BC Managed Care – PPO

## 2015-04-12 ENCOUNTER — Other Ambulatory Visit (HOSPITAL_BASED_OUTPATIENT_CLINIC_OR_DEPARTMENT_OTHER): Payer: BC Managed Care – PPO

## 2015-04-12 ENCOUNTER — Ambulatory Visit (HOSPITAL_BASED_OUTPATIENT_CLINIC_OR_DEPARTMENT_OTHER): Payer: BC Managed Care – PPO | Admitting: Nurse Practitioner

## 2015-04-12 ENCOUNTER — Encounter: Payer: Self-pay | Admitting: Nurse Practitioner

## 2015-04-12 ENCOUNTER — Other Ambulatory Visit: Payer: Self-pay | Admitting: *Deleted

## 2015-04-12 ENCOUNTER — Other Ambulatory Visit: Payer: Self-pay

## 2015-04-12 VITALS — BP 151/72 | HR 72 | Temp 97.9°F | Resp 18 | Ht 66.0 in | Wt 221.6 lb

## 2015-04-12 DIAGNOSIS — T380X5A Adverse effect of glucocorticoids and synthetic analogues, initial encounter: Principal | ICD-10-CM

## 2015-04-12 DIAGNOSIS — C9110 Chronic lymphocytic leukemia of B-cell type not having achieved remission: Secondary | ICD-10-CM

## 2015-04-12 DIAGNOSIS — T8609 Other complications of bone marrow transplant: Secondary | ICD-10-CM

## 2015-04-12 DIAGNOSIS — Z9481 Bone marrow transplant status: Secondary | ICD-10-CM

## 2015-04-12 DIAGNOSIS — E099 Drug or chemical induced diabetes mellitus without complications: Secondary | ICD-10-CM

## 2015-04-12 DIAGNOSIS — E860 Dehydration: Secondary | ICD-10-CM

## 2015-04-12 DIAGNOSIS — D89811 Chronic graft-versus-host disease: Secondary | ICD-10-CM | POA: Diagnosis not present

## 2015-04-12 DIAGNOSIS — R609 Edema, unspecified: Secondary | ICD-10-CM

## 2015-04-12 DIAGNOSIS — D801 Nonfamilial hypogammaglobulinemia: Secondary | ICD-10-CM

## 2015-04-12 LAB — CBC WITH DIFFERENTIAL/PLATELET
BASO%: 0.5 % (ref 0.0–2.0)
BASOS ABS: 0 10*3/uL (ref 0.0–0.1)
EOS ABS: 0 10*3/uL (ref 0.0–0.5)
EOS%: 0.6 % (ref 0.0–7.0)
HCT: 37.8 % — ABNORMAL LOW (ref 38.4–49.9)
HEMOGLOBIN: 12.3 g/dL — AB (ref 13.0–17.1)
LYMPH%: 32.3 % (ref 14.0–49.0)
MCH: 30.2 pg (ref 27.2–33.4)
MCHC: 32.4 g/dL (ref 32.0–36.0)
MCV: 93.3 fL (ref 79.3–98.0)
MONO#: 0.4 10*3/uL (ref 0.1–0.9)
MONO%: 9.9 % (ref 0.0–14.0)
NEUT#: 2 10*3/uL (ref 1.5–6.5)
NEUT%: 56.7 % (ref 39.0–75.0)
Platelets: 143 10*3/uL (ref 140–400)
RBC: 4.06 10*6/uL — AB (ref 4.20–5.82)
RDW: 16 % — ABNORMAL HIGH (ref 11.0–14.6)
WBC: 3.6 10*3/uL — ABNORMAL LOW (ref 4.0–10.3)
lymph#: 1.2 10*3/uL (ref 0.9–3.3)

## 2015-04-12 LAB — COMPREHENSIVE METABOLIC PANEL (CC13)
ALBUMIN: 3.6 g/dL (ref 3.5–5.0)
ALK PHOS: 97 U/L (ref 40–150)
ALT: 39 U/L (ref 0–55)
AST: 27 U/L (ref 5–34)
Anion Gap: 12 mEq/L — ABNORMAL HIGH (ref 3–11)
BUN: 48.2 mg/dL — AB (ref 7.0–26.0)
CALCIUM: 9 mg/dL (ref 8.4–10.4)
CHLORIDE: 111 meq/L — AB (ref 98–109)
CO2: 19 mEq/L — ABNORMAL LOW (ref 22–29)
Creatinine: 1.5 mg/dL — ABNORMAL HIGH (ref 0.7–1.3)
EGFR: 48 mL/min/{1.73_m2} — ABNORMAL LOW (ref >90)
Glucose: 242 mg/dl — ABNORMAL HIGH (ref 70–140)
Potassium: 4.4 mEq/L (ref 3.5–5.1)
SODIUM: 141 meq/L (ref 136–145)
TOTAL PROTEIN: 5.5 g/dL — AB (ref 6.4–8.3)
Total Bilirubin: 0.39 mg/dL (ref 0.20–1.20)

## 2015-04-12 LAB — MAGNESIUM (CC13): MAGNESIUM: 1.7 mg/dL (ref 1.5–2.5)

## 2015-04-12 MED ORDER — SODIUM CHLORIDE 0.9 % IJ SOLN
10.0000 mL | INTRAMUSCULAR | Status: DC | PRN
  Administered 2015-04-12: 10 mL via INTRAVENOUS
  Filled 2015-04-12: qty 10

## 2015-04-12 MED ORDER — SODIUM CHLORIDE 0.9 % IV SOLN
1.0000 g | Freq: Once | INTRAVENOUS | Status: AC
  Administered 2015-04-12: 1 g via INTRAVENOUS
  Filled 2015-04-12: qty 2

## 2015-04-12 MED ORDER — INSULIN PEN NEEDLE 31G X 5 MM MISC: Status: AC

## 2015-04-12 MED ORDER — HEPARIN SOD (PORK) LOCK FLUSH 100 UNIT/ML IV SOLN
500.0000 [IU] | Freq: Once | INTRAVENOUS | Status: AC
  Administered 2015-04-12: 500 [IU] via INTRAVENOUS
  Filled 2015-04-12: qty 5

## 2015-04-12 NOTE — Patient Instructions (Signed)
Hypomagnesemia Magnesium is a common ion (mineral) in the body which is needed for metabolism. It is about how the body handles food and other chemical reactions necessary for life. Only about 2% of the magnesium in our body is found in the blood. When this is low, it is called hypomagnesemia. The blood will measure only a tiny amount of the magnesium in our body. When it is low in our blood, it does not mean that the whole body supply is low. The normal serum concentration ranges from 1.8-2.5 mEq/L. When the level gets to be less than 1.0 mEq/L, a number of problems begin to happen.  CAUSES   Receiving intravenous fluids without magnesium replacement.  Loss of magnesium from the bowel by nasogastric suction.  Loss of magnesium from nausea and vomiting or severe diarrhea. Any of the inflammatory bowel conditions can cause this.  Abuse of alcohol often leads to low serum magnesium.  An inherited form of magnesium loss happens when the kidneys lose magnesium. This is called familial or primary hypomagnesemia.  Some medications such as diuretics also cause the loss of magnesium. SYMPTOMS  These following problems are worse if the changes in magnesium levels come on suddenly.  Tremor.  Confusion.  Muscle weakness.  Oversensitive to sights and sounds.  Sensitive reflexes.  Depression.  Muscular fibrillations.  Overreactivity of the nerves.  Irritability.  Psychosis.  Spasms of the hand muscles.  Tetany (where the muscles go into uncontrollable spasms). DIAGNOSIS  This condition can be diagnosed by blood tests. TREATMENT   In an emergency, magnesium can be given intravenously (by vein).  If the condition is less worrisome, it can be corrected by diet. High levels of magnesium are found in green leafy vegetables, peas, beans, and nuts among other things. It can also be given through medications by mouth.  If it is being caused by medications, changes can be made.  If  alcohol is a problem, help is available if there are difficulties giving it up. Document Released: 09/03/2005 Document Revised: 04/24/2014 Document Reviewed: 07/28/2008 [employer] Patient Information 2015 [employer]. This information is not intended to replace advice given to you by your health care provider. Make sure you discuss any questions you have with your health care provider.  

## 2015-04-12 NOTE — Progress Notes (Signed)
ID: Timothy [name]   DOB: August 23, 1946  MR#: [id number]  ESP#:[id number]  QJF:HLKT, Timothy Saxon, MD SU: OTHER MD: Timothy [name]; Timothy Shin, MD; Timothy [name], [name],MD; Timothy Rosenthal, MD; [facility] in [place], [place]:  Timothy Duval, RN [phone]), Timothy [name] M.D.  CHIEF COMPLAINT:  CLL, status post allogeneic stem cell transplant, GVHD CURRENT TREATMENT: Immunosuppression, supportive care  HISTORY OF CLL: From the original intake note:  We have very complete records from Dr. Racheal Patches in [place], and in summary:  The patient was initially diagnosed in August 2000, with a white cell count of 23,600, but normal hemoglobin and platelets, and no significant symptomatology. Over the next several years his white cell count drifted up, and he eventually developed some symptoms of night sweats in particular, leading to treatment with fludarabine, Cytoxan and rituxan for five cycles given between December 2006 and May 2007.  We have CT scans from June 2006, November 2006 and April 2007, and comparing the November 2006 and April 2007 scans, there was near complete response. He had subsequent therapy in [place] as detailed below, but with decreased response, leading to allogeneic stem-cell transplant at the [facility] 02/24/2012.  Subsequent history is as detailed below.  INTERVAL HISTORY: Timothy [name] returns today for follow up of his chronic lymphoid leukemia and graft-versus-host disease accompanied his wife, Timothy [name]. Since his last visit, his diarrhea is almost completely gone. However, his leg swelling has become worse. He has finally given in the advice to obtain compression stockings.   REVIEW OF SYSTEMS: Dr. Brigitte Pulse increased his short acting insulin schedule, and Timothy [name]'s blood sugars are already better controlled. His strength continues to improve despite being on $Remove'10mg'BlRbtGs$  prednisone daily, and he hopes to begin weaning off of this. He  denies shortness of breath, chest pain, cough, or palpitations. He does not sleep well at night because he wakes to use the bathroom. He denies headaches, dizziness, night sweats, or vision changes. A detailed review of systems is otherwise stable.  PAST MEDICAL HISTORY: Past Medical History  Diagnosis Date  . 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 Diverticular disease   . Hyperlipidemia   . Obesity   . Hypertension   . Hiatal hernia   . CMV (cytomegalovirus) antibody positive     pre-transplant, with seroconversion x2 pst-transplant  . Right bundle branch block     pre-transplant  . CKD (chronic kidney disease) stage 2, GFR 60-89 ml/min   . Pancytopenia   . Atrial fibrillation     post-transplant  . Myopathy   . 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 Diarrhea in adult patient 12/05/2012    Due to active GVHD  . Rash of face 12/05/2012    Due to GVHD  . Hypomagnesemia 01/26/2013  . Left hip pain 12/01/2013  . Steroid-induced diabetes     novalog  . Leukemia, chronic lymphoid   . CLL (chronic lymphocytic leukemia) 12/05/2012    Dx 07/1999; started Rx 12/06  AlloBMT 3/13    PAST SURGICAL HISTORY: Past Surgical History  Procedure Laterality Date  . Tonsillectomy and adenoidectomy    . Bone marrow transplant    . Flexible sigmoidoscopy  11/17/2012    Procedure: FLEXIBLE SIGMOIDOSCOPY;  Surgeon: Jeryl Columbia, MD;  Location: [facility];  Service: Endoscopy;  Laterality: N/A;  Dr Watt Climes states will be  admitted to rooom 1339 11/16/12  . Esophagogastroduodenoscopy  11/17/2012    Procedure: ESOPHAGOGASTRODUODENOSCOPY (EGD);  Surgeon: Jeryl Columbia, MD;  Location: [facility];  Service: Endoscopy;  Laterality: N/A;    FAMILY HISTORY Family History  Problem Relation Age of Onset  . Cancer Father   The patient's father died from  complications of chronic lymphocytic leukemia at the age of 67.  It had been diagnosed seven years before when he was 48.  The patient is enrolled in a familial chronic lymphocytic leukemia study out of the [employer].  The patient's mother is 76, alive, unfortunately suffering with dementia, and he has a brother, 85, who is otherwise in fair health.   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  His wife of >40 years, Timothy [name], is a homemaker.  Their daughter, Timothy [name], lives in [place].  She also is a Agricultural engineer.  The patient has an 30 year old grandson and an 39-year-old granddaughter, and that is really the main reason he moved to this area.  He is a Tourist information centre manager.     ADVANCED DIRECTIVES: In place  HEALTH MAINTENANCE: (Updated 04/13/2014) History  Substance Use Topics  . Smoking status: Never Smoker   . Smokeless tobacco: Never Used  . Alcohol Use: No     Colonoscopy: Nov 2013, Dr. Watt Climes  PSA: Not on file  Bone density:  Feb 2014;  Patient also has known insufficiency and pathologic fractures in addition to his long-standing history of steroid use.  Lipid panel: Jan 2015, elevated    Allergies  Allergen Reactions  . Benadryl [Diphenhydramine Hcl]     "Restless leg syndrome"    Current Outpatient Prescriptions  Medication Sig Dispense Refill  . acyclovir (ZOVIRAX) 400 MG tablet TAKE 2 TABLETS BY MOUTH TWICE DAILY 120 tablet 1  . budesonide (ENTOCORT EC) 3 MG 24 hr capsule Take 1 capsule (3 mg total) by mouth 3 (three) times daily. 90 capsule 6  . CARTIA XT 240 MG 24 hr capsule TAKE 1 CAPSULE BY MOUTH ONCE DAILY (Patient taking differently: take 1 capsule at bedtime) 30 capsule 3  . cholestyramine (QUESTRAN) 4 G packet Take 1 packet (4 g total) by mouth  2 (two) times daily. 60 each 12  . fluconazole (DIFLUCAN) 100 MG tablet Take 1 tablet (100 mg total) by mouth once. (Patient taking differently: Take 100 mg by mouth daily. ) 30 tablet 3  . furosemide (LASIX) 20 MG tablet Take one tablet daily; may repeat x1 PRN swelling (Patient taking differently: 20 mg 2 (two) times daily. Take one tablet daily; may repeat x1 PRN swelling) 60 tablet 1  . glucose blood (ONE TOUCH ULTRA TEST) test strip Test before meals and at bedtime. 300 each 2  . insulin aspart (NOVOLOG) 100 unit/mL injection Inject 12 Units into the skin 4 (four) times daily.     . Insulin Detemir (LEVEMIR) 100 UNIT/ML Pen Inject 50 Units into the skin daily.    . Insulin Pen Needle (B-D UF III MINI PEN NEEDLES) 31G X 5 MM MISC Use five daily with insulin as directed. 100 each 5  . labetalol (NORMODYNE) 200 MG tablet Take 2 tablets (400 mg total) by mouth 2 (two) times daily. 120 tablet 1  . Lidocaine-Hydrocortisone Ace 3-0.5 % KIT Apply 1 application topically as  needed (for pain). 1 each 3  . lisinopril (PRINIVIL,ZESTRIL) 10 MG tablet Take 10 mg by mouth daily.     Marland Kitchen loratadine (CLARITIN) 10 MG tablet Take 10 mg by mouth daily.    Marland Kitchen omeprazole (PRILOSEC) 20 MG capsule Take 1 capsule (20 mg total) by mouth daily. 30 capsule 5  . ONETOUCH DELICA LANCETS 36U MISC USE TO TEST BEFORE MEALS AND AT BEDTIME 300 each 0  . predniSONE (DELTASONE) 10 MG tablet Take 1 tablet (10 mg total) by mouth daily with breakfast. 90 tablet 12  . sertraline (ZOLOFT) 50 MG tablet Take 1 tablet (50 mg total) by mouth every morning. 90 tablet 5  . sodium bicarbonate 325 MG tablet Take 650 mg by mouth 2 (two) times daily.     Marland Kitchen sulfamethoxazole-trimethoprim (BACTRIM DS,SEPTRA DS) 800-160 MG per tablet Take 1 tablet by mouth daily. 30 tablet 5  . tacrolimus (PROGRAF) 0.5 MG capsule Take 3 capsules (1.5 mg total) by mouth 2 (two) times daily. 180 capsule 4  . VOLTAREN 1 % GEL Apply 2 g topically 2 (two) times daily.  Applied to back    . [DISCONTINUED] insulin aspart (NOVOLOG FLEXPEN) 100 UNIT/ML SOPN FlexPen 18units sq qam, 9units sq qpm, or as directed 15 mL 1   No current facility-administered medications for this visit.   Facility-Administered Medications Ordered in Other Visits  Medication Dose Route Frequency Provider Last Rate Last Dose  . 0.9 %  sodium chloride infusion   Intravenous Continuous Amy G Berry, PA-C 500 mL/hr at 03/12/13 0900    . sodium chloride 0.9 % injection 10 mL  10 mL Intravenous PRN Chauncey Cruel, MD   10 mL at 08/11/12 1606  . sodium chloride 0.9 % injection 10 mL  10 mL Intravenous PRN Chauncey Cruel, MD   10 mL at 04/12/15 1344    OBJECTIVE: Middle-aged white man in no acute distress Filed Vitals:   04/12/15 1026  BP: 151/72  Pulse: 72  Temp: 97.9 F (36.6 C)  Resp: 18  Body mass index is 35.78 kg/(m^2).  ECOG: 2 Filed Weights   04/12/15 1026  Weight: 221 lb 9.6 oz (100.517 kg)    Skin: warm, dry, multiple diffuse bruises to lower arms HEENT: sclerae anicteric, conjunctivae pink, oropharynx clear. No thrush or mucositis.  Lymph Nodes: No cervical or supraclavicular lymphadenopathy  Lungs: clear to auscultation bilaterally, no rales, wheezes, or rhonci  Heart: regular rate and rhythm  Abdomen: round, soft, non tender, positive bowel sounds  Musculoskeletal: No focal spinal tenderness, +2 edema to bilateral lower extremities Neuro: non focal, well oriented, positive affect    LABS:  CBC    Component Value Date/Time   WBC 3.6* 04/12/2015 1011   WBC 3.2* 02/27/2015 0805   RBC 4.06* 04/12/2015 1011   RBC 3.64* 02/27/2015 0805   RBC 3.82* 03/16/2013 1400   HGB 12.3* 04/12/2015 1011   HGB 11.3* 02/27/2015 0805   HCT 37.8* 04/12/2015 1011   HCT 34.1* 02/27/2015 0805   PLT 143 04/12/2015 1011   PLT 120* 02/27/2015 0805   MCV 93.3 04/12/2015 1011   MCV 93.7 02/27/2015 0805   MCH 30.2 04/12/2015 1011   MCH 31.0 02/27/2015 0805   MCHC 32.4  04/12/2015 1011   MCHC 33.1 02/27/2015 0805   RDW 16.0* 04/12/2015 1011   RDW 16.2* 02/27/2015 0805   LYMPHSABS 1.2 04/12/2015 1011   LYMPHSABS 1.4 03/18/2013 0615   MONOABS 0.4 04/12/2015 1011   MONOABS 0.3 03/18/2013  0615   EOSABS 0.0 04/12/2015 1011   EOSABS 0.0 03/18/2013 0615   BASOSABS 0.0 04/12/2015 1011   BASOSABS 0.0 03/18/2013 0615       Chemistry      Component Value Date/Time   NA 141 04/12/2015 1012   NA 139 04/05/2015 0911   K 4.4 04/12/2015 1012   K 4.6 04/05/2015 0911   CL 110 04/05/2015 0911   CL 101 06/15/2013 1034   CO2 19* 04/12/2015 1012   CO2 22 04/05/2015 0911   BUN 48.2* 04/12/2015 1012   BUN 45* 04/05/2015 0911   CREATININE 1.5* 04/12/2015 1012   CREATININE 1.23 04/05/2015 0911      Component Value Date/Time   CALCIUM 9.0 04/12/2015 1012   CALCIUM 8.4 04/05/2015 0911   ALKPHOS 97 04/12/2015 1012   ALKPHOS 108 04/05/2015 0911   AST 27 04/12/2015 1012   AST 50* 04/05/2015 0911   ALT 39 04/12/2015 1012   ALT 59* 04/05/2015 0911   BILITOT 0.39 04/12/2015 1012   BILITOT 0.5 04/05/2015 0911     STUDIES: No results found. Patient: FRANS, VALENTE Collected: 03/22/2015 Client: [place] Accession: [id number] Received: 03/26/2015 Clarene Essex, MD DOB: 03/26/1946 Age: 69 Gender: M Reported: 03/27/2015 [address], [address] Patient Ph: MRN #[place], [place] [id number] Client Acc#: [phone] Chart #: [id number] Phone: [phone] Fax: CC: REPORT OF SURGICAL PATHOLOGY FINAL DIAGNOSIS Diagnosis 1. Surgical [P], terminal ileum colon/cecum colon, ? polyp, excision - FINDINGS CONSISTENT WITH INFLAMMATORY POLYP. - SEE MICROSCOPIC DESCRIPTION. 2. Surgical [P], random colon, biopsy - UNREMARKABLE COLONIC MUCOSA. - NO SIGNIFICANT INFLAMMATION OR OTHER ABNORMALITIES IDENTIFIED. 3. Surgical [P], transverse/descending/sigmoid colon, polyp, excision - TUBULAR ADENOMAS. - NO HIGH GRADE DYSPLASIA OR MALIGNANCY IDENTIFIED. 4. Surgical [P], descending  colon, ? polyp - FINDINGS CONSISTENT WITH INFLAMMATORY POLYP. - SEE MICROSCOPIC DESCRIPTION. Microscopic Comment 1. There is an ileal fragment with fibrosis in the stroma and slight inflammation. No granulomas are identified. 4. There is colonic mucosa with active inflammation and focal erosion and the features are consistent with an inflammation polyp. No granulomas are identified. Claudette Laws MD Pathologist, Electronic Signature (Case signed 03/27/2015)  ASSESSMENT: 69 y.o.  man with a history of well-differentiated lymphocytic lymphoma/ chronic lymphoid leukemia initially diagnosed in 2000, not requiring intervention until 2006; with multiple chromosomal abnormalities.  His treatment history is as follows:  (1) fludarabine/cyclophosphamide/rituximab x5 completed May 2007.   (2) rituximab for 8 doses October 2010, with partial response   (3) Leustatin and ofatumumab weekly x8 July to September 2011 followed by maintenance ofatumumab  every 2 months, with initial response but rising counts September 2012   (4) status-post unrelated donor stem-cell transplant 02/24/2012 at [facility]  (a) conditioning regimen consisted of fludarabine + TBI at 200 cGy, followed by rituximab x27;  (b) CMV reactivation x3 (patient CMV positive, donor negative), s/p ganciclovir treatment; 3d reactivation August 2013, s/p gancyclovir, with negative PCR mid-September 2013; last gancyclovir dose 10/06/2012 (c) Chronic GVHD: involving gut and skin, treated with steroids, tacrolimus and MMF.  MMF was eventually d/c'd and tacrolimus currently at a dose of 1.$RemoveB'5mg'jXXdeAXY$  BID (d) atrial fibrillation: resolved on brief amiodarone regimen (e) steroid-induced myopathy: improving  (f) hypomagnesemia: improved after d/c gancyclovir, needs continuing support (g) hypogammaglobulinemia: requiring IVIG most recently 08/03/2014. (h) history of elevated triglycerides (606 on 07/14/2012)  (i) adrenal insufficiency: on  prednisone and budesonide (j) pancytopenia,resolved (k) brief episode of neutropenia ([place] 300) February 2015, accompanied by diarrhea; resolved   (5) restaging  studies February-March 2015 including CT scans, flow cytometry, and bone marrow biopsy, showed no evidence of residual chronic lymphoid leukemia.  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 Status post kyphoplasty at L3-4 in June 2014. Also with evidence of rib fractures and insufficiency fractures bilaterally of the sacral  alae, noted by CT in March 2015. --   Denosumab started 12/08/2013, given as prolia Q6 months which is what has been approved by his insurance, most recent dose 10/02/2014  (12) chronic back pain and hip pain controlled with OxyContin and hydrocodone/APAP.  (12) nausea: well controlled on current meds  (13)  Positive c.diff, 03/08/2013, on Flagyl 500 mg TID x 20 days, then on oral vanco with Questran, showing improvement; positive when repeated April 2014; Negative x 3 since then  (14) persistently increased BUN and potassium  (15)  Hypertension, on labetalol, cardizem, lisinopril, and furosemide; managed by Dr. Brigitte Pulse  (16) steroid induced hyperglycemia, on sterlix and 70/30 insulin, followed by Dr. Loanne Drilling and Dr. Brigitte Pulse  (17) hypogammaglobulinemia-- requiring intermittent supplementation, most recent dose 10/19/2014  (18) squamous cell CA in situ removed from left parietal scalp October  2014, second lesion to be removed may 2015   PLAN: Besides his leg swelling, Amaar's condition is improved. I am delighted that his diarrhea is resolving. The labs were reviewed in detail and were stable. He plans to be fit for compression stocking finally first thing tomorrow morning. As stated in Dr. Virgie Dad last note he is going to try to wean off the prednisone, by dropping down to $Remov'5mg'hzbMKz$  every morning and 2.$RemoveBefor'5mg'TgJhONEBfXMm$  every evening. If he does well with this we will step down to just $Remov'5mg'AZkFKK$  every morning in several weeks' time.   We will continue the weekly magnesium and IV fluids. He will return for a follow up visit with Dr. Jana Hakim in 2 weeks. He understands and agrees with this plan. He has been encouraged to call with any issues that might arise before his next visit here.   Laurie Panda, NP 04/12/2015

## 2015-04-19 ENCOUNTER — Ambulatory Visit (HOSPITAL_BASED_OUTPATIENT_CLINIC_OR_DEPARTMENT_OTHER): Payer: BC Managed Care – PPO

## 2015-04-19 DIAGNOSIS — C9110 Chronic lymphocytic leukemia of B-cell type not having achieved remission: Secondary | ICD-10-CM

## 2015-04-19 LAB — COMPREHENSIVE METABOLIC PANEL (CC13)
ALK PHOS: 98 U/L (ref 40–150)
ALT: 42 U/L (ref 0–55)
AST: 29 U/L (ref 5–34)
Albumin: 3.4 g/dL — ABNORMAL LOW (ref 3.5–5.0)
Anion Gap: 14 mEq/L — ABNORMAL HIGH (ref 3–11)
BILIRUBIN TOTAL: 0.32 mg/dL (ref 0.20–1.20)
BUN: 45 mg/dL — AB (ref 7.0–26.0)
CO2: 16 mEq/L — ABNORMAL LOW (ref 22–29)
Calcium: 8 mg/dL — ABNORMAL LOW (ref 8.4–10.4)
Chloride: 114 mEq/L — ABNORMAL HIGH (ref 98–109)
Creatinine: 1.3 mg/dL (ref 0.7–1.3)
EGFR: 55 mL/min/{1.73_m2} — AB (ref >90)
Glucose: 175 mg/dl — ABNORMAL HIGH (ref 70–140)
Potassium: 4.3 mEq/L (ref 3.5–5.1)
Sodium: 144 mEq/L (ref 136–145)
Total Protein: 5.2 g/dL — ABNORMAL LOW (ref 6.4–8.3)

## 2015-04-19 LAB — CBC WITH DIFFERENTIAL/PLATELET
BASO%: 0.5 % (ref 0.0–2.0)
Basophils Absolute: 0 10*3/uL (ref 0.0–0.1)
EOS%: 1.8 % (ref 0.0–7.0)
Eosinophils Absolute: 0.1 10*3/uL (ref 0.0–0.5)
HCT: 36 % — ABNORMAL LOW (ref 38.4–49.9)
HGB: 11.6 g/dL — ABNORMAL LOW (ref 13.0–17.1)
LYMPH%: 44.8 % (ref 14.0–49.0)
MCH: 29.9 pg (ref 27.2–33.4)
MCHC: 32.3 g/dL (ref 32.0–36.0)
MCV: 92.6 fL (ref 79.3–98.0)
MONO#: 0.4 10*3/uL (ref 0.1–0.9)
MONO%: 13.4 % (ref 0.0–14.0)
NEUT%: 39.5 % (ref 39.0–75.0)
NEUTROS ABS: 1.2 10*3/uL — AB (ref 1.5–6.5)
Platelets: 164 10*3/uL (ref 140–400)
RBC: 3.89 10*6/uL — AB (ref 4.20–5.82)
RDW: 15.5 % — ABNORMAL HIGH (ref 11.0–14.6)
WBC: 3.1 10*3/uL — ABNORMAL LOW (ref 4.0–10.3)
lymph#: 1.4 10*3/uL (ref 0.9–3.3)

## 2015-04-19 LAB — MAGNESIUM (CC13): Magnesium: 1.6 mg/dl (ref 1.5–2.5)

## 2015-04-19 MED ORDER — SODIUM CHLORIDE 0.9 % IJ SOLN
10.0000 mL | INTRAMUSCULAR | Status: DC | PRN
  Administered 2015-04-19: 10 mL via INTRAVENOUS
  Filled 2015-04-19: qty 10

## 2015-04-19 MED ORDER — HEPARIN SOD (PORK) LOCK FLUSH 100 UNIT/ML IV SOLN
500.0000 [IU] | Freq: Once | INTRAVENOUS | Status: AC | PRN
  Administered 2015-04-19: 500 [IU]
  Filled 2015-04-19: qty 5

## 2015-04-19 MED ORDER — SODIUM CHLORIDE 0.9 % IV SOLN
1.0000 g | Freq: Once | INTRAVENOUS | Status: AC
  Administered 2015-04-19: 1 g via INTRAVENOUS
  Filled 2015-04-19: qty 2

## 2015-04-19 NOTE — Patient Instructions (Signed)
Hypomagnesemia Magnesium is a common ion (mineral) in the body which is needed for metabolism. It is about how the body handles food and other chemical reactions necessary for life. Only about 2% of the magnesium in our body is found in the blood. When this is low, it is called hypomagnesemia. The blood will measure only a tiny amount of the magnesium in our body. When it is low in our blood, it does not mean that the whole body supply is low. The normal serum concentration ranges from 1.8-2.5 mEq/L. When the level gets to be less than 1.0 mEq/L, a number of problems begin to happen.  CAUSES   Receiving intravenous fluids without magnesium replacement.  Loss of magnesium from the bowel by nasogastric suction.  Loss of magnesium from nausea and vomiting or severe diarrhea. Any of the inflammatory bowel conditions can cause this.  Abuse of alcohol often leads to low serum magnesium.  An inherited form of magnesium loss happens when the kidneys lose magnesium. This is called familial or primary hypomagnesemia.  Some medications such as diuretics also cause the loss of magnesium. SYMPTOMS  These following problems are worse if the changes in magnesium levels come on suddenly.  Tremor.  Confusion.  Muscle weakness.  Oversensitive to sights and sounds.  Sensitive reflexes.  Depression.  Muscular fibrillations.  Overreactivity of the nerves.  Irritability.  Psychosis.  Spasms of the hand muscles.  Tetany (where the muscles go into uncontrollable spasms). DIAGNOSIS  This condition can be diagnosed by blood tests. TREATMENT   In an emergency, magnesium can be given intravenously (by vein).  If the condition is less worrisome, it can be corrected by diet. High levels of magnesium are found in green leafy vegetables, peas, beans, and nuts among other things. It can also be given through medications by mouth.  If it is being caused by medications, changes can be made.  If  alcohol is a problem, help is available if there are difficulties giving it up. Document Released: 09/03/2005 Document Revised: 04/24/2014 Document Reviewed: 07/28/2008 [employer] Patient Information 2015 [employer]. This information is not intended to replace advice given to you by your health care provider. Make sure you discuss any questions you have with your health care provider.  

## 2015-04-24 ENCOUNTER — Other Ambulatory Visit: Payer: Self-pay | Admitting: Oncology

## 2015-04-26 ENCOUNTER — Other Ambulatory Visit: Payer: Self-pay

## 2015-04-26 ENCOUNTER — Ambulatory Visit (HOSPITAL_BASED_OUTPATIENT_CLINIC_OR_DEPARTMENT_OTHER): Payer: BC Managed Care – PPO | Admitting: Oncology

## 2015-04-26 ENCOUNTER — Ambulatory Visit (HOSPITAL_BASED_OUTPATIENT_CLINIC_OR_DEPARTMENT_OTHER): Payer: BC Managed Care – PPO

## 2015-04-26 ENCOUNTER — Other Ambulatory Visit (HOSPITAL_BASED_OUTPATIENT_CLINIC_OR_DEPARTMENT_OTHER): Payer: BC Managed Care – PPO

## 2015-04-26 VITALS — BP 132/73 | HR 72 | Temp 98.0°F | Resp 18 | Ht 66.0 in | Wt 221.7 lb

## 2015-04-26 DIAGNOSIS — R197 Diarrhea, unspecified: Secondary | ICD-10-CM

## 2015-04-26 DIAGNOSIS — D849 Immunodeficiency, unspecified: Secondary | ICD-10-CM

## 2015-04-26 DIAGNOSIS — D801 Nonfamilial hypogammaglobulinemia: Secondary | ICD-10-CM

## 2015-04-26 DIAGNOSIS — M79604 Pain in right leg: Secondary | ICD-10-CM

## 2015-04-26 DIAGNOSIS — D89811 Chronic graft-versus-host disease: Secondary | ICD-10-CM | POA: Diagnosis not present

## 2015-04-26 DIAGNOSIS — C9110 Chronic lymphocytic leukemia of B-cell type not having achieved remission: Secondary | ICD-10-CM

## 2015-04-26 DIAGNOSIS — M545 Low back pain: Secondary | ICD-10-CM

## 2015-04-26 DIAGNOSIS — T380X5A Adverse effect of glucocorticoids and synthetic analogues, initial encounter: Secondary | ICD-10-CM

## 2015-04-26 DIAGNOSIS — E099 Drug or chemical induced diabetes mellitus without complications: Secondary | ICD-10-CM

## 2015-04-26 DIAGNOSIS — M818 Other osteoporosis without current pathological fracture: Secondary | ICD-10-CM

## 2015-04-26 DIAGNOSIS — D899 Disorder involving the immune mechanism, unspecified: Secondary | ICD-10-CM

## 2015-04-26 DIAGNOSIS — M79605 Pain in left leg: Secondary | ICD-10-CM

## 2015-04-26 DIAGNOSIS — E2740 Unspecified adrenocortical insufficiency: Secondary | ICD-10-CM

## 2015-04-26 DIAGNOSIS — E46 Unspecified protein-calorie malnutrition: Secondary | ICD-10-CM

## 2015-04-26 DIAGNOSIS — T8609 Other complications of bone marrow transplant: Principal | ICD-10-CM

## 2015-04-26 DIAGNOSIS — I10 Essential (primary) hypertension: Secondary | ICD-10-CM

## 2015-04-26 DIAGNOSIS — Z9489 Other transplanted organ and tissue status: Secondary | ICD-10-CM

## 2015-04-26 DIAGNOSIS — M810 Age-related osteoporosis without current pathological fracture: Secondary | ICD-10-CM

## 2015-04-26 DIAGNOSIS — E291 Testicular hypofunction: Secondary | ICD-10-CM

## 2015-04-26 LAB — CBC WITH DIFFERENTIAL/PLATELET
BASO%: 0.2 % (ref 0.0–2.0)
BASOS ABS: 0 10*3/uL (ref 0.0–0.1)
EOS ABS: 0 10*3/uL (ref 0.0–0.5)
EOS%: 0.8 % (ref 0.0–7.0)
HEMATOCRIT: 37.9 % — AB (ref 38.4–49.9)
HEMOGLOBIN: 12.4 g/dL — AB (ref 13.0–17.1)
LYMPH%: 32.9 % (ref 14.0–49.0)
MCH: 30.5 pg (ref 27.2–33.4)
MCHC: 32.7 g/dL (ref 32.0–36.0)
MCV: 93.1 fL (ref 79.3–98.0)
MONO#: 0.5 10*3/uL (ref 0.1–0.9)
MONO%: 9.1 % (ref 0.0–14.0)
NEUT#: 2.9 10*3/uL (ref 1.5–6.5)
NEUT%: 57 % (ref 39.0–75.0)
Platelets: 129 10*3/uL — ABNORMAL LOW (ref 140–400)
RBC: 4.07 10*6/uL — ABNORMAL LOW (ref 4.20–5.82)
RDW: 14.6 % (ref 11.0–14.6)
WBC: 5 10*3/uL (ref 4.0–10.3)
lymph#: 1.7 10*3/uL (ref 0.9–3.3)

## 2015-04-26 LAB — COMPREHENSIVE METABOLIC PANEL (CC13)
ALT: 42 U/L (ref 0–55)
ANION GAP: 11 meq/L (ref 3–11)
AST: 27 U/L (ref 5–34)
Albumin: 3.5 g/dL (ref 3.5–5.0)
Alkaline Phosphatase: 80 U/L (ref 40–150)
BUN: 41.7 mg/dL — ABNORMAL HIGH (ref 7.0–26.0)
CALCIUM: 8.4 mg/dL (ref 8.4–10.4)
CHLORIDE: 111 meq/L — AB (ref 98–109)
CO2: 18 meq/L — AB (ref 22–29)
CREATININE: 1.4 mg/dL — AB (ref 0.7–1.3)
EGFR: 52 mL/min/{1.73_m2} — ABNORMAL LOW (ref >90)
Glucose: 163 mg/dl — ABNORMAL HIGH (ref 70–140)
Potassium: 4.7 mEq/L (ref 3.5–5.1)
Sodium: 140 mEq/L (ref 136–145)
Total Bilirubin: 0.37 mg/dL (ref 0.20–1.20)
Total Protein: 5.4 g/dL — ABNORMAL LOW (ref 6.4–8.3)

## 2015-04-26 LAB — MAGNESIUM (CC13): Magnesium: 1.7 mg/dl (ref 1.5–2.5)

## 2015-04-26 MED ORDER — SODIUM CHLORIDE 0.9 % IV SOLN
1.0000 g | Freq: Once | INTRAVENOUS | Status: AC
  Administered 2015-04-26: 1 g via INTRAVENOUS
  Filled 2015-04-26: qty 2

## 2015-04-26 MED ORDER — SODIUM CHLORIDE 0.9 % IJ SOLN
10.0000 mL | INTRAMUSCULAR | Status: DC | PRN
  Administered 2015-04-26: 10 mL via INTRAVENOUS
  Filled 2015-04-26: qty 10

## 2015-04-26 MED ORDER — HEPARIN SOD (PORK) LOCK FLUSH 100 UNIT/ML IV SOLN
500.0000 [IU] | Freq: Once | INTRAVENOUS | Status: AC | PRN
  Administered 2015-04-26: 500 [IU]
  Filled 2015-04-26: qty 5

## 2015-04-26 MED ORDER — HEPARIN SOD (PORK) LOCK FLUSH 100 UNIT/ML IV SOLN
250.0000 [IU] | Freq: Once | INTRAVENOUS | Status: DC | PRN
  Filled 2015-04-26: qty 5

## 2015-04-26 NOTE — Progress Notes (Signed)
ID: [name]   DOB: [birth year]  MR#: [id number]  KGM#:[id number]  UYQ:IHKV, [name], MD SU: OTHER MD: [name]; [name], MD; [name], [name],MD; [name], MD; [facility] in [place], [place]:  [name], RN [phone]), [name] M.D.  CHIEF COMPLAINT:  CLL, status post allogeneic stem cell transplant, GVHD CURRENT TREATMENT: Immunosuppression, supportive care  HISTORY OF CLL: From the original intake note:  We have very complete records from Dr. Racheal Patches in [place], and in summary:  The patient was initially diagnosed in August 2000, with a white cell count of 23,600, but normal hemoglobin and platelets, and no significant symptomatology. Over the next several years his white cell count drifted up, and he eventually developed some symptoms of night sweats in particular, leading to treatment with fludarabine, Cytoxan and rituxan for five cycles given between December 2006 and May 2007.  We have CT scans from June 2006, November 2006 and April 2007, and comparing the November 2006 and April 2007 scans, there was near complete response. He had subsequent therapy in [place] as detailed below, but with decreased response, leading to allogeneic stem-cell transplant at the [facility] 02/24/2012.  Subsequent history is as detailed below.  INTERVAL HISTORY: Rochell returns today for follow up of his chronic lymphoid leukemia and graft-versus-host disease accompanied his wife, [name]. Since his last visit with me there have been a few adjustments in his medications. Renal has taken him off the bicarbonate, which they feel may have been contributing to his ankle swelling. Carmon has ordered but has not yet started wearing compression stockings. His blood sugar is being more tightly controlled through Dr. Raul Del office. Also about a week ago he dropped his prednisone finally to 7.5 mg in the morning.  REVIEW  OF SYSTEMS: Ronald Pippins feels better than he has in a while. He is walking about 2500 steps a day and he tells me his goal is 3000 steps a day. He still has ankle swelling and pain in his left leg more than the right leg. However his diarrhea is considerably improved. He only had 2 loose bowel movements in the past week. He has not noted any skin rash or other skin changes. He complains of hearing loss, easy bruising, and of course continuing back pain. 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 After that he is planning a trip to [place]. A detailed review of systems today was otherwise stable  PAST MEDICAL HISTORY: Past Medical History  Diagnosis Date  . 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 Diverticular disease   . Hyperlipidemia   . Obesity   . Hypertension   . Hiatal hernia   . CMV (cytomegalovirus) antibody positive     pre-transplant, with seroconversion x2 pst-transplant  . Right bundle branch block     pre-transplant  . CKD (chronic kidney disease) stage 2, GFR 60-89 ml/min   . Pancytopenia   . Atrial fibrillation     post-transplant  . Myopathy   . 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 Diarrhea in adult patient 12/05/2012    Due to active GVHD  . Rash of face 12/05/2012    Due to GVHD  . Hypomagnesemia 01/26/2013  . Left hip pain 12/01/2013  .  Steroid-induced diabetes     novalog  . Leukemia, chronic lymphoid   . CLL (chronic lymphocytic leukemia) 12/05/2012    Dx 07/1999; started Rx 12/06  AlloBMT 3/13    PAST SURGICAL HISTORY: Past Surgical History  Procedure Laterality Date  . Tonsillectomy and adenoidectomy    . Bone marrow transplant    . Flexible sigmoidoscopy  11/17/2012    Procedure: FLEXIBLE SIGMOIDOSCOPY;  Surgeon: Jeryl Columbia, MD;  Location: [facility] END[facility];  Service: Endoscopy;  Laterality: N/A;  Dr Watt Climes states will be admitted to rooom 1339 11/16/12  . Esophagogastroduodenoscopy  11/17/2012    Procedure: ESOPHAGOGASTRODUODENOSCOPY (EGD);  Surgeon: Jeryl Columbia, MD;  Location: [facility];  Service: Endoscopy;  Laterality: N/A;    FAMILY HISTORY Family History  Problem Relation Age of Onset  . Cancer Father   The patient's father died from complications of chronic lymphocytic leukemia at the age of 26.  It had been diagnosed seven years before when he was 74.  The patient is enrolled in a familial chronic lymphocytic leukemia study out of the [employer].  The patient's mother is 78, alive, unfortunately suffering with dementia, and he has a brother, 33, who is otherwise in fair health.   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  His wife of >40 years, [name], is a homemaker.  Their daughter, [name], lives in [place].  She also is a Agricultural engineer.  The patient has an 69 year old grandson and an 18-year-old granddaughter, and that is really the main reason he moved to this area.  He is a Tourist information centre manager.     ADVANCED DIRECTIVES: In place  HEALTH MAINTENANCE: (Updated 04/13/2014) History  Substance Use Topics  . Smoking status: Never Smoker   . Smokeless tobacco: Never Used  . Alcohol Use: No     Colonoscopy: Nov 2013, Dr. Watt Climes  PSA: Not on file  Bone density:  Feb 2014;  Patient also has known insufficiency and pathologic fractures in addition to his long-standing history of steroid use.  Lipid panel: Jan 2015, elevated    Allergies  Allergen Reactions  . Benadryl [Diphenhydramine Hcl]     "Restless leg syndrome"    Current Outpatient Prescriptions  Medication Sig  Dispense Refill  . acyclovir (ZOVIRAX) 400 MG tablet TAKE 2 TABLETS BY MOUTH TWICE DAILY 120 tablet 1  . budesonide (ENTOCORT EC) 3 MG 24 hr capsule Take 1 capsule (3 mg total) by mouth 3 (three) times daily. 90 capsule 6  . cholestyramine (QUESTRAN) 4 G packet Take 1 packet (4 g total) by mouth 2 (two) times daily. 60 each 12  . diltiazem (CARDIZEM CD) 240 MG 24 hr capsule TAKE 1 CAPSULE BY MOUTH ONCE DAILY 30 capsule 3  . fluconazole (DIFLUCAN) 100 MG tablet Take 1 tablet (100 mg total) by mouth once. (Patient taking differently: Take 100 mg by mouth daily. ) 30 tablet 3  . furosemide (LASIX) 20 MG tablet Take one tablet daily; may repeat x1 PRN swelling (Patient taking differently: 20 mg 2 (two) times daily. Take one tablet daily; may repeat x1 PRN swelling) 60 tablet 1  . glucose blood (ONE TOUCH ULTRA TEST) test strip Test before meals and at bedtime. 300 each 2  . insulin aspart (NOVOLOG) 100 unit/mL injection Inject 12 Units into the  skin 4 (four) times daily.     . Insulin Detemir (LEVEMIR) 100 UNIT/ML Pen Inject 50 Units into the skin daily.    . Insulin Pen Needle (B-D UF III MINI PEN NEEDLES) 31G X 5 MM MISC Use five daily with insulin as directed. 100 each 5  . labetalol (NORMODYNE) 200 MG tablet Take 2 tablets (400 mg total) by mouth 2 (two) times daily. 120 tablet 1  . Lidocaine-Hydrocortisone Ace 3-0.5 % KIT Apply 1 application topically as needed (for pain). 1 each 3  . lisinopril (PRINIVIL,ZESTRIL) 10 MG tablet Take 10 mg by mouth daily.     Marland Kitchen loratadine (CLARITIN) 10 MG tablet Take 10 mg by mouth daily.    Marland Kitchen omeprazole (PRILOSEC) 20 MG capsule Take 1 capsule (20 mg total) by mouth daily. 30 capsule 5  . ONETOUCH DELICA LANCETS 11S MISC USE TO TEST BEFORE MEALS AND AT BEDTIME 300 each 0  . predniSONE (DELTASONE) 10 MG tablet Take 1 tablet (10 mg total) by mouth daily with breakfast. 90 tablet 12  . sertraline (ZOLOFT) 50 MG tablet Take 1 tablet (50 mg total) by mouth every morning.  90 tablet 5  . sodium bicarbonate 325 MG tablet Take 650 mg by mouth 2 (two) times daily.     Marland Kitchen sulfamethoxazole-trimethoprim (BACTRIM DS,SEPTRA DS) 800-160 MG per tablet Take 1 tablet by mouth daily. 30 tablet 5  . sulfamethoxazole-trimethoprim (BACTRIM DS,SEPTRA DS) 800-160 MG per tablet TAKE 1 TABLET BY MOUTH ONCE DAILY 30 tablet 5  . tacrolimus (PROGRAF) 0.5 MG capsule Take 3 capsules (1.5 mg total) by mouth 2 (two) times daily. 180 capsule 4  . VOLTAREN 1 % GEL Apply 2 g topically 2 (two) times daily. Applied to back    . [DISCONTINUED] insulin aspart (NOVOLOG FLEXPEN) 100 UNIT/ML SOPN FlexPen 18units sq qam, 9units sq qpm, or as directed 15 mL 1   No current facility-administered medications for this visit.   Facility-Administered Medications Ordered in Other Visits  Medication Dose Route Frequency Provider Last Rate Last Dose  . 0.9 %  sodium chloride infusion   Intravenous Continuous Amy G Berry, PA-C 500 mL/hr at 03/12/13 0900    . sodium chloride 0.9 % injection 10 mL  10 mL Intravenous PRN Chauncey Cruel, MD   10 mL at 08/11/12 1606    OBJECTIVE: Middle-aged white man what appears stated age 71 Vitals:   04/26/15 0912  BP: 132/73  Pulse: 72  Temp: 98 F (36.7 C)  Resp: 18  Body mass index is 35.8 kg/(m^2).  ECOG: 2 Filed Weights   04/26/15 0912  Weight: 221 lb 11.2 oz (100.562 kg)    Sclerae unicteric, pupils equal and reactive Oropharynx clear and moist-- no thrush, no thrush or other lesionst No cervical or supraclavicular adenopathy Lungs no rales or rhonchi Heart regular rate and rhythm Abd soft, obese,nontender, positive bowel sounds MSK no focal spinal tenderness, bilateral lower extremity edema as previously noted Neuro: nonfocal, well oriented, positiveaffect Skin: No rash noted     LABS:  CBC    Component Value Date/Time   WBC 5.0 04/26/2015 0903   WBC 3.2* 02/27/2015 0805   RBC 4.07* 04/26/2015 0903   RBC 3.64* 02/27/2015 0805   RBC 3.82*  03/16/2013 1400   HGB 12.4* 04/26/2015 0903   HGB 11.3* 02/27/2015 0805   HCT 37.9* 04/26/2015 0903   HCT 34.1* 02/27/2015 0805   PLT 129* 04/26/2015 0903   PLT 120* 02/27/2015 0805   MCV 93.1  04/26/2015 0903   MCV 93.7 02/27/2015 0805   MCH 30.5 04/26/2015 0903   MCH 31.0 02/27/2015 0805   MCHC 32.7 04/26/2015 0903   MCHC 33.1 02/27/2015 0805   RDW 14.6 04/26/2015 0903   RDW 16.2* 02/27/2015 0805   LYMPHSABS 1.7 04/26/2015 0903   LYMPHSABS 1.4 03/18/2013 0615   MONOABS 0.5 04/26/2015 0903   MONOABS 0.3 03/18/2013 0615   EOSABS 0.0 04/26/2015 0903   EOSABS 0.0 03/18/2013 0615   BASOSABS 0.0 04/26/2015 0903   BASOSABS 0.0 03/18/2013 0615       Chemistry      Component Value Date/Time   NA 140 04/26/2015 0903   NA 139 04/05/2015 0911   K 4.7 04/26/2015 0903   K 4.6 04/05/2015 0911   CL 110 04/05/2015 0911   CL 101 06/15/2013 1034   CO2 18* 04/26/2015 0903   CO2 22 04/05/2015 0911   BUN 41.7* 04/26/2015 0903   BUN 45* 04/05/2015 0911   CREATININE 1.4* 04/26/2015 0903   CREATININE 1.23 04/05/2015 0911      Component Value Date/Time   CALCIUM 8.4 04/26/2015 0903   CALCIUM 8.4 04/05/2015 0911   ALKPHOS 80 04/26/2015 0903   ALKPHOS 108 04/05/2015 0911   AST 27 04/26/2015 0903   AST 50* 04/05/2015 0911   ALT 42 04/26/2015 0903   ALT 59* 04/05/2015 0911   BILITOT 0.37 04/26/2015 0903   BILITOT 0.5 04/05/2015 0911     STUDIES: No results found. Patient: ALVESTER, EADS Collected: 03/22/2015 Client: [facility] Accession: [id number] Received: 03/26/2015 Clarene Essex, MD DOB: 23-Jul-1946 Age: 69 Gender: M Reported: 03/27/2015 [address] Patient Ph: MRN #[place], [place] [id number] Client Acc#: [id number] Chart #: [id number] Phone: [phone] Fax: CC: REPORT OF SURGICAL PATHOLOGY FINAL DIAGNOSIS Diagnosis 1. Surgical [P], terminal ileum colon/cecum colon, ? polyp, excision - FINDINGS CONSISTENT WITH INFLAMMATORY POLYP. - SEE MICROSCOPIC  DESCRIPTION. 2. Surgical [P], random colon, biopsy - UNREMARKABLE COLONIC MUCOSA. - NO SIGNIFICANT INFLAMMATION OR OTHER ABNORMALITIES IDENTIFIED. 3. Surgical [P], transverse/descending/sigmoid colon, polyp, excision - TUBULAR ADENOMAS. - NO HIGH GRADE DYSPLASIA OR MALIGNANCY IDENTIFIED. 4. Surgical [P], descending colon, ? polyp - FINDINGS CONSISTENT WITH INFLAMMATORY POLYP. - SEE MICROSCOPIC DESCRIPTION. Microscopic Comment 1. There is an ileal fragment with fibrosis in the stroma and slight inflammation. No granulomas are identified. 4. There is colonic mucosa with active inflammation and focal erosion and the features are consistent with an inflammation polyp. No granulomas are identified. Claudette Laws MD Pathologist, Electronic Signature (Case signed 03/27/2015)  ASSESSMENT: 69 y.o. [place] man with a history of well-differentiated lymphocytic lymphoma/ chronic lymphoid leukemia initially diagnosed in 2000, not requiring intervention until 2006; with multiple chromosomal abnormalities.  His treatment history is as follows:  (1) fludarabine/cyclophosphamide/rituximab x5 completed May 2007.   (2) rituximab for 8 doses October 2010, with partial response   (3) Leustatin and ofatumumab weekly x8 July to September 2011 followed by maintenance ofatumumab  every 2 months, with initial response but rising counts September 2012   (4) status-post unrelated donor stem-cell transplant 02/24/2012 at the [facility]  (a) conditioning regimen consisted of fludarabine + TBI at 200 cGy, followed by rituximab x27;  (b) CMV reactivation x3 (patient CMV positive, donor negative), s/p ganciclovir treatment; 3d reactivation August 2013, s/p gancyclovir, with negative PCR mid-September 2013; last gancyclovir dose 10/06/2012 (c) Chronic GVHD: involving gut and skin, treated with steroids, tacrolimus and MMF.  MMF was eventually d/c'd and tacrolimus currently at a dose of  1.58m BID (d) atrial  fibrillation: resolved on brief amiodarone regimen (e) steroid-induced myopathy: improving  (f) hypomagnesemia: improved after d/c gancyclovir, needs continuing support (g) hypogammaglobulinemia: requiring IVIG most recently 08/03/2014. (h) history of elevated triglycerides (606 on 07/14/2012)  (i) adrenal insufficiency: on prednisone and budesonide (j) pancytopenia,resolved (k) brief episode of neutropenia ([id number]) February 2015, accompanied by diarrhea; resolved   (5) restaging studies February-March 2015 including CT scans, flow cytometry, and bone marrow biopsy, showed no evidence of residual chronic lymphoid leukemia.  (a) repeat bone marrow biopsy 02/27/2015 showed no evidence of chronic lymphoid leukemia and also no dyspoiesis.flow cytometry showed no B cells.  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 Status post kyphoplasty at L3-4 in June 2014. Also with evidence of rib fractures and insufficiency fractures bilaterally of the sacral  alae, noted by CT in March 2015. --   Denosumab started 12/08/2013, given as prolia Q6 months which is what has been approved by his insurance, most recent dose 10/02/2014  (12) chronic back pain and hip pain controlled with OxyContin and hydrocodone/APAP.  (12) nausea: well controlled on current meds  (13)  Positive c.diff, 03/08/2013, on Flagyl 500 mg TID x 20 days, then on oral vanco with Questran, showing improvement;  positive when repeated April 2014; Negative x 3 since then  (14) persistently increased BUN and potassium  (15)  Hypertension, on labetalol, cardizem, lisinopril, and furosemide; managed by Dr. SBrigitte Pulse (16) steroid induced hyperglycemia, on sterlix and 70/30 insulin, followed by Dr. ELoanne Drillingand Dr. SBrigitte Pulse (17) hypogammaglobulinemia-- requiring intermittent supplementation, most recent dose 10/19/2014  (18) squamous cell CA in situ removed from left parietal scalp October 2014, second lesion to be removed may 2015   PLAN:  TFritzfeels better on his current, lower dose of prednisone. We have had quite a bit of trouble lowering his immunosuppressive drugs so we are going to go slowly. He will see me again in 4 weeks and at that point if he continues to do as well as now we will drop the prednisone to 5 mg every morning.  Of course that will help his diabetes as well. He is working closely with Dr. SRaul Deloffice in that regard and the numbers already looks significantly better.  He continues to exercise, although I would wish she would walk more each day. His current goal is 3000 steps daily and with a little perseverance he can get that done and better than that as well.  We have been holding his IVIG pending his evaluation, but now that he has had his bone marrow biopsy and solon I will set him up for IVIG infusion May 19. We'll continue to follow the total IgG level on a regular basis and replace whenever it drops below 250.  Otherwise I'm not making any changes in his overall care. He will continue to receive weekly magnesium, and see uKoreaevery 2 weeks. He knows to call for any problems that may develop before hhis next visit here.  MChauncey Cruel MD 04/26/2015

## 2015-04-26 NOTE — Patient Instructions (Signed)
Hypomagnesemia Magnesium is a common ion (mineral) in the body which is needed for metabolism. It is about how the body handles food and other chemical reactions necessary for life. Only about 2% of the magnesium in our body is found in the blood. When this is low, it is called hypomagnesemia. The blood will measure only a tiny amount of the magnesium in our body. When it is low in our blood, it does not mean that the whole body supply is low. The normal serum concentration ranges from 1.8-2.5 mEq/L. When the level gets to be less than 1.0 mEq/L, a number of problems begin to happen.  CAUSES   Receiving intravenous fluids without magnesium replacement.  Loss of magnesium from the bowel by nasogastric suction.  Loss of magnesium from nausea and vomiting or severe diarrhea. Any of the inflammatory bowel conditions can cause this.  Abuse of alcohol often leads to low serum magnesium.  An inherited form of magnesium loss happens when the kidneys lose magnesium. This is called familial or primary hypomagnesemia.  Some medications such as diuretics also cause the loss of magnesium. SYMPTOMS  These following problems are worse if the changes in magnesium levels come on suddenly.  Tremor.  Confusion.  Muscle weakness.  Oversensitive to sights and sounds.  Sensitive reflexes.  Depression.  Muscular fibrillations.  Overreactivity of the nerves.  Irritability.  Psychosis.  Spasms of the hand muscles.  Tetany (where the muscles go into uncontrollable spasms). DIAGNOSIS  This condition can be diagnosed by blood tests. TREATMENT   In an emergency, magnesium can be given intravenously (by vein).  If the condition is less worrisome, it can be corrected by diet. High levels of magnesium are found in green leafy vegetables, peas, beans, and nuts among other things. It can also be given through medications by mouth.  If it is being caused by medications, changes can be made.  If  alcohol is a problem, help is available if there are difficulties giving it up. Document Released: 09/03/2005 Document Revised: 04/24/2014 Document Reviewed: 07/28/2008 [employer] Patient Information 2015 [employer]. This information is not intended to replace advice given to you by your health care provider. Make sure you discuss any questions you have with your health care provider.  

## 2015-04-27 ENCOUNTER — Telehealth: Payer: Self-pay | Admitting: General Practice

## 2015-04-27 ENCOUNTER — Telehealth: Payer: Self-pay | Admitting: Oncology

## 2015-04-27 ENCOUNTER — Ambulatory Visit: Payer: Self-pay

## 2015-04-27 ENCOUNTER — Other Ambulatory Visit: Payer: Self-pay | Admitting: Oncology

## 2015-04-27 ENCOUNTER — Other Ambulatory Visit: Payer: Self-pay | Admitting: *Deleted

## 2015-04-27 NOTE — Telephone Encounter (Signed)
Appointments made for ivig and patient will get a new schedule/avs in chemo

## 2015-05-03 ENCOUNTER — Ambulatory Visit (HOSPITAL_BASED_OUTPATIENT_CLINIC_OR_DEPARTMENT_OTHER): Payer: BC Managed Care – PPO

## 2015-05-03 ENCOUNTER — Other Ambulatory Visit (HOSPITAL_BASED_OUTPATIENT_CLINIC_OR_DEPARTMENT_OTHER): Payer: BC Managed Care – PPO

## 2015-05-03 DIAGNOSIS — C9110 Chronic lymphocytic leukemia of B-cell type not having achieved remission: Secondary | ICD-10-CM

## 2015-05-03 LAB — CBC WITH DIFFERENTIAL/PLATELET
BASO%: 0 % (ref 0.0–2.0)
Basophils Absolute: 0 10*3/uL (ref 0.0–0.1)
EOS%: 0.5 % (ref 0.0–7.0)
Eosinophils Absolute: 0 10*3/uL (ref 0.0–0.5)
HCT: 39.6 % (ref 38.4–49.9)
HGB: 13.1 g/dL (ref 13.0–17.1)
LYMPH%: 28.3 % (ref 14.0–49.0)
MCH: 30.5 pg (ref 27.2–33.4)
MCHC: 33.1 g/dL (ref 32.0–36.0)
MCV: 92.3 fL (ref 79.3–98.0)
MONO#: 0.5 10*3/uL (ref 0.1–0.9)
MONO%: 8.2 % (ref 0.0–14.0)
NEUT#: 3.9 10*3/uL (ref 1.5–6.5)
NEUT%: 63 % (ref 39.0–75.0)
PLATELETS: 142 10*3/uL (ref 140–400)
RBC: 4.29 10*6/uL (ref 4.20–5.82)
RDW: 14.8 % — ABNORMAL HIGH (ref 11.0–14.6)
WBC: 6.2 10*3/uL (ref 4.0–10.3)
lymph#: 1.8 10*3/uL (ref 0.9–3.3)

## 2015-05-03 LAB — COMPREHENSIVE METABOLIC PANEL (CC13)
ALBUMIN: 3.7 g/dL (ref 3.5–5.0)
ALT: 37 U/L (ref 0–55)
AST: 25 U/L (ref 5–34)
Alkaline Phosphatase: 90 U/L (ref 40–150)
Anion Gap: 9 mEq/L (ref 3–11)
BUN: 47.7 mg/dL — AB (ref 7.0–26.0)
CALCIUM: 8.8 mg/dL (ref 8.4–10.4)
CO2: 17 mEq/L — ABNORMAL LOW (ref 22–29)
CREATININE: 1.4 mg/dL — AB (ref 0.7–1.3)
Chloride: 113 mEq/L — ABNORMAL HIGH (ref 98–109)
EGFR: 53 mL/min/{1.73_m2} — AB (ref >90)
GLUCOSE: 119 mg/dL (ref 70–140)
Potassium: 4.4 mEq/L (ref 3.5–5.1)
Sodium: 139 mEq/L (ref 136–145)
Total Bilirubin: 0.34 mg/dL (ref 0.20–1.20)
Total Protein: 5.8 g/dL — ABNORMAL LOW (ref 6.4–8.3)

## 2015-05-03 LAB — MAGNESIUM (CC13): Magnesium: 2.1 mg/dl (ref 1.5–2.5)

## 2015-05-03 MED ORDER — SODIUM CHLORIDE 0.9 % IV SOLN
1.0000 g | Freq: Once | INTRAVENOUS | Status: AC
  Administered 2015-05-03: 1 g via INTRAVENOUS
  Filled 2015-05-03: qty 2

## 2015-05-03 NOTE — Patient Instructions (Addendum)
Hypomagnesemia Magnesium is a common ion (mineral) in the body which is needed for metabolism. It is about how the body handles food and other chemical reactions necessary for life. Only about 2% of the magnesium in our body is found in the blood. When this is low, it is called hypomagnesemia. The blood will measure only a tiny amount of the magnesium in our body. When it is low in our blood, it does not mean that the whole body supply is low. The normal serum concentration ranges from 1.8-2.5 mEq/L. When the level gets to be less than 1.0 mEq/L, a number of problems begin to happen.  CAUSES   Receiving intravenous fluids without magnesium replacement.  Loss of magnesium from the bowel by nasogastric suction.  Loss of magnesium from nausea and vomiting or severe diarrhea. Any of the inflammatory bowel conditions can cause this.  Abuse of alcohol often leads to low serum magnesium.  An inherited form of magnesium loss happens when the kidneys lose magnesium. This is called familial or primary hypomagnesemia.  Some medications such as diuretics also cause the loss of magnesium. SYMPTOMS  These following problems are worse if the changes in magnesium levels come on suddenly.  Tremor.  Confusion.  Muscle weakness.  Oversensitive to sights and sounds.  Sensitive reflexes.  Depression.  Muscular fibrillations.  Overreactivity of the nerves.  Irritability.  Psychosis.  Spasms of the hand muscles.  Tetany (where the muscles go into uncontrollable spasms). DIAGNOSIS  This condition can be diagnosed by blood tests. TREATMENT   In an emergency, magnesium can be given intravenously (by vein).  If the condition is less worrisome, it can be corrected by diet. High levels of magnesium are found in green leafy vegetables, peas, beans, and nuts among other things. It can also be given through medications by mouth.  If it is being caused by medications, changes can be made.  If  alcohol is a problem, help is available if there are difficulties giving it up. Document Released: 09/03/2005 Document Revised: 04/24/2014 Document Reviewed: 07/28/2008 [employer] Patient Information 2015 [employer]. This information is not intended to replace advice given to you by your health care provider. Make sure you discuss any questions you have with your health care provider.  

## 2015-05-10 ENCOUNTER — Ambulatory Visit (HOSPITAL_BASED_OUTPATIENT_CLINIC_OR_DEPARTMENT_OTHER): Payer: BC Managed Care – PPO

## 2015-05-10 ENCOUNTER — Encounter: Payer: Self-pay | Admitting: Nurse Practitioner

## 2015-05-10 ENCOUNTER — Other Ambulatory Visit (HOSPITAL_BASED_OUTPATIENT_CLINIC_OR_DEPARTMENT_OTHER): Payer: BC Managed Care – PPO

## 2015-05-10 ENCOUNTER — Ambulatory Visit: Payer: Self-pay

## 2015-05-10 ENCOUNTER — Other Ambulatory Visit: Payer: Self-pay | Admitting: Nurse Practitioner

## 2015-05-10 ENCOUNTER — Other Ambulatory Visit: Payer: Self-pay

## 2015-05-10 ENCOUNTER — Ambulatory Visit (HOSPITAL_BASED_OUTPATIENT_CLINIC_OR_DEPARTMENT_OTHER): Payer: BC Managed Care – PPO | Admitting: Nurse Practitioner

## 2015-05-10 VITALS — BP 109/62 | HR 72 | Temp 97.1°F | Resp 18 | Ht 66.0 in | Wt 221.8 lb

## 2015-05-10 DIAGNOSIS — C9110 Chronic lymphocytic leukemia of B-cell type not having achieved remission: Secondary | ICD-10-CM

## 2015-05-10 DIAGNOSIS — D89811 Chronic graft-versus-host disease: Secondary | ICD-10-CM

## 2015-05-10 DIAGNOSIS — E875 Hyperkalemia: Secondary | ICD-10-CM | POA: Diagnosis not present

## 2015-05-10 DIAGNOSIS — D801 Nonfamilial hypogammaglobulinemia: Secondary | ICD-10-CM | POA: Diagnosis not present

## 2015-05-10 DIAGNOSIS — T8609 Other complications of bone marrow transplant: Secondary | ICD-10-CM

## 2015-05-10 LAB — CBC WITH DIFFERENTIAL/PLATELET
BASO%: 0.2 % (ref 0.0–2.0)
BASOS ABS: 0 10*3/uL (ref 0.0–0.1)
EOS%: 0.5 % (ref 0.0–7.0)
Eosinophils Absolute: 0 10*3/uL (ref 0.0–0.5)
HEMATOCRIT: 38.4 % (ref 38.4–49.9)
HGB: 12.4 g/dL — ABNORMAL LOW (ref 13.0–17.1)
LYMPH%: 26.1 % (ref 14.0–49.0)
MCH: 30.2 pg (ref 27.2–33.4)
MCHC: 32.3 g/dL (ref 32.0–36.0)
MCV: 93.7 fL (ref 79.3–98.0)
MONO#: 0.3 10*3/uL (ref 0.1–0.9)
MONO%: 5 % (ref 0.0–14.0)
NEUT#: 3.8 10*3/uL (ref 1.5–6.5)
NEUT%: 68.2 % (ref 39.0–75.0)
PLATELETS: 155 10*3/uL (ref 140–400)
RBC: 4.1 10*6/uL — ABNORMAL LOW (ref 4.20–5.82)
RDW: 15.3 % — ABNORMAL HIGH (ref 11.0–14.6)
WBC: 5.6 10*3/uL (ref 4.0–10.3)
lymph#: 1.5 10*3/uL (ref 0.9–3.3)

## 2015-05-10 LAB — COMPREHENSIVE METABOLIC PANEL (CC13)
ALT: 31 U/L (ref 0–55)
ANION GAP: 12 meq/L — AB (ref 3–11)
AST: 26 U/L (ref 5–34)
Albumin: 3.6 g/dL (ref 3.5–5.0)
Alkaline Phosphatase: 81 U/L (ref 40–150)
BILIRUBIN TOTAL: 0.32 mg/dL (ref 0.20–1.20)
BUN: 49.7 mg/dL — ABNORMAL HIGH (ref 7.0–26.0)
CO2: 17 mEq/L — ABNORMAL LOW (ref 22–29)
Calcium: 8.2 mg/dL — ABNORMAL LOW (ref 8.4–10.4)
Chloride: 114 mEq/L — ABNORMAL HIGH (ref 98–109)
Creatinine: 1.4 mg/dL — ABNORMAL HIGH (ref 0.7–1.3)
EGFR: 53 mL/min/{1.73_m2} — ABNORMAL LOW (ref >90)
Glucose: 127 mg/dl (ref 70–140)
Potassium: 5.3 mEq/L — ABNORMAL HIGH (ref 3.5–5.1)
SODIUM: 143 meq/L (ref 136–145)
Total Protein: 5.4 g/dL — ABNORMAL LOW (ref 6.4–8.3)

## 2015-05-10 LAB — MAGNESIUM (CC13): Magnesium: 2 mg/dl (ref 1.5–2.5)

## 2015-05-10 MED ORDER — ALTEPLASE 2 MG IJ SOLR
2.0000 mg | Freq: Once | INTRAMUSCULAR | Status: DC | PRN
  Filled 2015-05-10: qty 2

## 2015-05-10 MED ORDER — HEPARIN SOD (PORK) LOCK FLUSH 100 UNIT/ML IV SOLN
250.0000 [IU] | Freq: Once | INTRAVENOUS | Status: DC | PRN
  Filled 2015-05-10: qty 5

## 2015-05-10 MED ORDER — SODIUM CHLORIDE 0.9 % IJ SOLN
10.0000 mL | INTRAMUSCULAR | Status: AC | PRN
  Administered 2015-05-10: 10 mL
  Filled 2015-05-10: qty 10

## 2015-05-10 MED ORDER — SODIUM CHLORIDE 0.9 % IV SOLN
1.0000 g | Freq: Once | INTRAVENOUS | Status: AC
  Administered 2015-05-10: 1 g via INTRAVENOUS
  Filled 2015-05-10: qty 2

## 2015-05-10 MED ORDER — IMMUNE GLOBULIN (HUMAN) 10 GM/100ML IV SOLN
1.0000 g/kg | Freq: Once | INTRAVENOUS | Status: AC
  Administered 2015-05-10: 100 g via INTRAVENOUS
  Filled 2015-05-10: qty 1000

## 2015-05-10 MED ORDER — HEPARIN SOD (PORK) LOCK FLUSH 100 UNIT/ML IV SOLN
500.0000 [IU] | INTRAVENOUS | Status: DC | PRN
  Filled 2015-05-10: qty 5

## 2015-05-10 MED ORDER — SODIUM CHLORIDE 0.9 % IV SOLN
Freq: Once | INTRAVENOUS | Status: AC
  Administered 2015-05-10: 10:00:00 via INTRAVENOUS

## 2015-05-10 MED ORDER — HEPARIN SOD (PORK) LOCK FLUSH 100 UNIT/ML IV SOLN
500.0000 [IU] | Freq: Once | INTRAVENOUS | Status: AC | PRN
  Administered 2015-05-10: 500 [IU]
  Filled 2015-05-10: qty 5

## 2015-05-10 NOTE — Progress Notes (Signed)
ID: Timothy [name]   DOB: December 25, 1945  MR#: [id number]  NOM#:[id number]  JGG:EZMO, Timothy Saxon, MD SU: OTHER MD: Timothy [name]; Timothy Shin, MD; Timothy [name], [name],MD; Timothy Rosenthal, MD; [facility] in [place], [place]:  Timothy Duval, RN [phone]), Timothy [name] M.D.  CHIEF COMPLAINT:  CLL, status post allogeneic stem cell transplant, GVHD CURRENT TREATMENT: Immunosuppression, supportive care  HISTORY OF CLL: From the original intake note:  We have very complete records from Dr. Racheal Patches in [place], and in summary:  The patient was initially diagnosed in August 2000, with a white cell count of 23,600, but normal hemoglobin and platelets, and no significant symptomatology. Over the next several years his white cell count drifted up, and he eventually developed some symptoms of night sweats in particular, leading to treatment with fludarabine, Cytoxan and rituxan for five cycles given between December 2006 and May 2007.  We have CT scans from June 2006, November 2006 and April 2007, and comparing the November 2006 and April 2007 scans, there was near complete response. He had subsequent therapy in [place] as detailed below, but with decreased response, leading to allogeneic stem-cell transplant at the [facility] 02/24/2012.  Subsequent history is as detailed below.  INTERVAL HISTORY: Timothy [name] returns today for follow up of his chronic lymphoid leukemia and graft-versus-host disease accompanied his wife, Timothy [name]. He is going to receive IVIG today.  REVIEW OF SYSTEMS: He continues to tolerate the 7.$RemoveBefore'5mg'UsanBMptVIaJo$  of prednisone daily well. His diarrhea has decreased and is more manageable. His strength is improving. Since his endocrinologist put him on a sliding scale, his blood sugars have been under better control.  His bilateral lower extremity swelling has decreased and he is just on lasix daily instead of BID. There was an error in the  order of his compression stockings, so he has not yet received them. He visited the dermatologist yesterday and his skin is clear of any suspicious lesions. A detailed review of systems today was otherwise stable.  PAST MEDICAL HISTORY: Past Medical History  Diagnosis Date  . 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 Diverticular disease   . Hyperlipidemia   . Obesity   . Hypertension   . Hiatal hernia   . CMV (cytomegalovirus) antibody positive     pre-transplant, with seroconversion x2 pst-transplant  . Right bundle branch block     pre-transplant  . CKD (chronic kidney disease) stage 2, GFR 60-89 ml/min   . Pancytopenia   . Atrial fibrillation     post-transplant  . Myopathy   . 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 Diarrhea in adult patient 12/05/2012    Due to active GVHD  . Rash of face 12/05/2012    Due to GVHD  . Hypomagnesemia 01/26/2013  . Left hip pain 12/01/2013  . Steroid-induced diabetes     novalog  . Leukemia, chronic lymphoid   . CLL (chronic lymphocytic leukemia) 12/05/2012    Dx 07/1999; started Rx 12/06  AlloBMT 3/13    PAST SURGICAL HISTORY: Past Surgical History  Procedure Laterality Date  . Tonsillectomy and adenoidectomy    . Bone marrow transplant    . Flexible sigmoidoscopy  11/17/2012    Procedure: FLEXIBLE SIGMOIDOSCOPY;  Surgeon: Timothy Columbia, MD;  Location: [facility];  Service: Endoscopy;  Laterality: N/A;  Dr Watt Climes states will be admitted  to rooom 1339 11/16/12  . Esophagogastroduodenoscopy  11/17/2012    Procedure: ESOPHAGOGASTRODUODENOSCOPY (EGD);  Surgeon: Timothy Columbia, MD;  Location: [facility];  Service: Endoscopy;  Laterality: N/A;    FAMILY HISTORY Family History  Problem Relation Age of Onset  . Cancer Father   The patient's father died from complications of chronic lymphocytic  leukemia at the age of 29.  It had been diagnosed seven years before when he was 34.  The patient is enrolled in a familial chronic lymphocytic leukemia study out of the [employer].  The patient's mother is 59, alive, unfortunately suffering with dementia, and he has a brother, 6, who is otherwise in fair health.   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  His wife of >40 years, Timothy [name], is a homemaker.  Their daughter, Timothy [name], lives in [place].  She also is a Agricultural engineer.  The patient has an 45 year old grandson and an 43-year-old granddaughter, and that is really the main reason he moved to this area.  He is a Tourist information centre manager.     ADVANCED DIRECTIVES: In place  HEALTH MAINTENANCE: (Updated 04/13/2014) History  Substance Use Topics  . Smoking status: Never Smoker   . Smokeless tobacco: Never Used  . Alcohol Use: No     Colonoscopy: Nov 2013, Dr. Watt Climes  PSA: Not on file  Bone density:  Feb 2014;  Patient also has known insufficiency and pathologic fractures in addition to his long-standing history of steroid use.  Lipid panel: Jan 2015, elevated    Allergies  Allergen Reactions  . Benadryl [Diphenhydramine Hcl]     "Restless leg syndrome"    Current Outpatient Prescriptions  Medication Sig Dispense Refill  . acyclovir (ZOVIRAX) 400 MG tablet TAKE 2 TABLETS BY MOUTH TWICE DAILY 120 tablet 1  . budesonide (ENTOCORT EC) 3 MG 24 hr capsule Take 1 capsule (3 mg total) by mouth 3 (three) times daily. 90 capsule 6  . cholestyramine (QUESTRAN) 4 G packet Take 1 packet (4 g total) by mouth 2 (two) times daily. 60 each 12  . diltiazem (CARDIZEM CD) 240 MG 24 hr capsule TAKE 1 CAPSULE BY MOUTH ONCE DAILY 30 capsule 3  . fluconazole (DIFLUCAN) 100 MG tablet Take  1 tablet (100 mg total) by mouth once. (Patient taking differently: Take 100 mg by mouth daily. ) 30 tablet 3  . furosemide (LASIX) 20 MG tablet Take one tablet daily; may repeat x1 PRN swelling (Patient taking differently: Take 20 mg by mouth daily. Take one tablet daily) 60 tablet 1  . glucose blood (ONE TOUCH ULTRA TEST) test strip Test before meals and at bedtime. 300 each 2  . insulin aspart (NOVOLOG) 100 unit/mL injection Inject 12-20 Units into the skin. Sliding scale    . Insulin Detemir (LEVEMIR) 100 UNIT/ML Pen Inject 50 Units into the skin daily.    . Insulin Pen Needle (B-D UF III MINI PEN NEEDLES) 31G X 5 MM MISC Use five daily with insulin as directed. 100 each 5  . labetalol (NORMODYNE) 200 MG tablet Take 2 tablets (400 mg total) by mouth 2 (two) times daily. 120 tablet 1  . Lidocaine-Hydrocortisone Ace 3-0.5 % KIT Apply 1 application topically as needed (for pain). 1 each 3  . lisinopril (PRINIVIL,ZESTRIL) 10 MG tablet Take 10 mg  by mouth daily.     Marland Kitchen loratadine (CLARITIN) 10 MG tablet Take 10 mg by mouth daily.    Marland Kitchen omeprazole (PRILOSEC) 20 MG capsule Take 1 capsule (20 mg total) by mouth daily. 30 capsule 5  . ONETOUCH DELICA LANCETS 33G MISC USE TO TEST BEFORE MEALS AND AT BEDTIME 300 each 0  . predniSONE (DELTASONE) 10 MG tablet Take 1 tablet (10 mg total) by mouth daily with breakfast. (Patient taking differently: Take 7.5 mg by mouth daily with breakfast. ) 90 tablet 12  . sertraline (ZOLOFT) 50 MG tablet Take 1 tablet (50 mg total) by mouth every morning. 90 tablet 5  . sulfamethoxazole-trimethoprim (BACTRIM DS,SEPTRA DS) 800-160 MG per tablet Take 1 tablet by mouth daily. 30 tablet 5  . sulfamethoxazole-trimethoprim (BACTRIM DS,SEPTRA DS) 800-160 MG per tablet TAKE 1 TABLET BY MOUTH ONCE DAILY 30 tablet 5  . tacrolimus (PROGRAF) 0.5 MG capsule Take 3 capsules (1.5 mg total) by mouth 2 (two) times daily. 180 capsule 4  . VOLTAREN 1 % GEL Apply 2 g topically 2 (two) times  daily. Applied to back    . [DISCONTINUED] insulin aspart (NOVOLOG FLEXPEN) 100 UNIT/ML SOPN FlexPen 18units sq qam, 9units sq qpm, or as directed 15 mL 1   No current facility-administered medications for this visit.   Facility-Administered Medications Ordered in Other Visits  Medication Dose Route Frequency Provider Last Rate Last Dose  . 0.9 %  sodium chloride infusion   Intravenous Continuous Amy G Berry, PA-C 500 mL/hr at 03/12/13 0900    . sodium chloride 0.9 % injection 10 mL  10 mL Intravenous PRN Lowella Dell, MD   10 mL at 08/11/12 1606    OBJECTIVE: Middle-aged white man what appears stated age 56 Vitals:   05/10/15 0909  BP: 109/62  Pulse: 72  Temp: 97.1 F (36.2 C)  Resp: 18  Body mass index is 35.82 kg/(m^2).  ECOG: 2 Filed Weights   05/10/15 0909  Weight: 221 lb 12.8 oz (100.608 kg)   Skin: warm, dry, diffusing bruising to bilateral arms, hyperpigmentation to bilateral hands HEENT: sclerae anicteric, conjunctivae pink, oropharynx clear. No thrush or mucositis.  Lymph Nodes: No cervical or supraclavicular lymphadenopathy  Lungs: clear to auscultation bilaterally, no rales, wheezes, or rhonci  Heart: regular rate and rhythm  Abdomen: round, soft, non tender, positive bowel sounds  Musculoskeletal: No focal spinal tenderness, improving bilateral lower extremity edema, pitting Neuro: non focal, well oriented, positive affect   LABS:  CBC    Component Value Date/Time   WBC 5.6 05/10/2015 0846   WBC 3.2* 02/27/2015 0805   RBC 4.10* 05/10/2015 0846   RBC 3.64* 02/27/2015 0805   RBC 3.82* 03/16/2013 1400   HGB 12.4* 05/10/2015 0846   HGB 11.3* 02/27/2015 0805   HCT 38.4 05/10/2015 0846   HCT 34.1* 02/27/2015 0805   PLT 155 05/10/2015 0846   PLT 120* 02/27/2015 0805   MCV 93.7 05/10/2015 0846   MCV 93.7 02/27/2015 0805   MCH 30.2 05/10/2015 0846   MCH 31.0 02/27/2015 0805   MCHC 32.3 05/10/2015 0846   MCHC 33.1 02/27/2015 0805   RDW 15.3* 05/10/2015  0846   RDW 16.2* 02/27/2015 0805   LYMPHSABS 1.5 05/10/2015 0846   LYMPHSABS 1.4 03/18/2013 0615   MONOABS 0.3 05/10/2015 0846   MONOABS 0.3 03/18/2013 0615   EOSABS 0.0 05/10/2015 0846   EOSABS 0.0 03/18/2013 0615   BASOSABS 0.0 05/10/2015 0846   BASOSABS 0.0 03/18/2013 0615  Chemistry      Component Value Date/Time   NA 143 05/10/2015 0846   NA 139 04/05/2015 0911   K 5.3* 05/10/2015 0846   K 4.6 04/05/2015 0911   CL 110 04/05/2015 0911   CL 101 06/15/2013 1034   CO2 17* 05/10/2015 0846   CO2 22 04/05/2015 0911   BUN 49.7* 05/10/2015 0846   BUN 45* 04/05/2015 0911   CREATININE 1.4* 05/10/2015 0846   CREATININE 1.23 04/05/2015 0911      Component Value Date/Time   CALCIUM 8.2* 05/10/2015 0846   CALCIUM 8.4 04/05/2015 0911   ALKPHOS 81 05/10/2015 0846   ALKPHOS 108 04/05/2015 0911   AST 26 05/10/2015 0846   AST 50* 04/05/2015 0911   ALT 31 05/10/2015 0846   ALT 59* 04/05/2015 0911   BILITOT 0.32 05/10/2015 0846   BILITOT 0.5 04/05/2015 0911     STUDIES: No results found. Patient: KEDRICK, MCNAMEE Collected: 03/22/2015 Client: [facility] Accession: [id number] Received: 03/26/2015 Clarene Essex, MD DOB: July 30, 1946 Age: 69 Gender: M Reported: 03/27/2015 [address] Patient Ph: MRN #[place], [place] [address] Client Acc#: [id number] Chart #: [id number] Phone: [phone] Fax: CC: REPORT OF SURGICAL PATHOLOGY FINAL DIAGNOSIS Diagnosis 1. Surgical [P], terminal ileum colon/cecum colon, ? polyp, excision - FINDINGS CONSISTENT WITH INFLAMMATORY POLYP. - SEE MICROSCOPIC DESCRIPTION. 2. Surgical [P], random colon, biopsy - UNREMARKABLE COLONIC MUCOSA. - NO SIGNIFICANT INFLAMMATION OR OTHER ABNORMALITIES IDENTIFIED. 3. Surgical [P], transverse/descending/sigmoid colon, polyp, excision - TUBULAR ADENOMAS. - NO HIGH GRADE DYSPLASIA OR MALIGNANCY IDENTIFIED. 4. Surgical [P], descending colon, ? polyp - FINDINGS CONSISTENT WITH INFLAMMATORY POLYP. -  SEE MICROSCOPIC DESCRIPTION. Microscopic Comment 1. There is an ileal fragment with fibrosis in the stroma and slight inflammation. No granulomas are identified. 4. There is colonic mucosa with active inflammation and focal erosion and the features are consistent with an inflammation polyp. No granulomas are identified. Claudette Laws MD Pathologist, Electronic Signature (Case signed 03/27/2015)  ASSESSMENT: 69 y.o. [place] man with a history of well-differentiated lymphocytic lymphoma/ chronic lymphoid leukemia initially diagnosed in 2000, not requiring intervention until 2006; with multiple chromosomal abnormalities.  His treatment history is as follows:  (1) fludarabine/cyclophosphamide/rituximab x5 completed May 2007.   (2) rituximab for 8 doses October 2010, with partial response   (3) Leustatin and ofatumumab weekly x8 July to September 2011 followed by maintenance ofatumumab  every 2 months, with initial response but rising counts September 2012   (4) status-post unrelated donor stem-cell transplant 02/24/2012 at [facility]  (a) conditioning regimen consisted of fludarabine + TBI at 200 cGy, followed by rituximab x27;  (b) CMV reactivation x3 (patient CMV positive, donor negative), s/p ganciclovir treatment; 3d reactivation August 2013, s/p gancyclovir, with negative PCR mid-September 2013; last gancyclovir dose 10/06/2012 (c) Chronic GVHD: involving gut and skin, treated with steroids, tacrolimus and MMF.  MMF was eventually d/c'd and tacrolimus currently at a dose of 1.$RemoveB'5mg'AfJkEzEd$  BID (d) atrial fibrillation: resolved on brief amiodarone regimen (e) steroid-induced myopathy: improving  (f) hypomagnesemia: improved after d/c gancyclovir, needs continuing support (g) hypogammaglobulinemia: requiring IVIG most recently 08/03/2014. (h) history of elevated triglycerides (606 on 07/14/2012)  (i) adrenal insufficiency: on prednisone and budesonide (j) pancytopenia,resolved (k) brief episode  of neutropenia ([place] 300) February 2015, accompanied by diarrhea; resolved   (5) restaging studies February-March 2015 including CT scans, flow cytometry, and bone marrow biopsy, showed no evidence of residual chronic lymphoid leukemia.  (a) repeat bone marrow biopsy 02/27/2015 showed no evidence of  chronic lymphoid leukemia and also no dyspoiesis.flow cytometry showed no B cells.  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 Status post kyphoplasty at L3-4 in June 2014. Also with evidence of rib fractures and insufficiency fractures bilaterally of the sacral  alae, noted by CT in March 2015. --   Denosumab started 12/08/2013, given as prolia Q6 months which is what has been approved by his insurance, most recent dose 10/02/2014  (12) chronic back pain and hip pain controlled with OxyContin and hydrocodone/APAP.  (12) nausea: well controlled on current meds  (13)  Positive c.diff, 03/08/2013, on Flagyl 500 mg TID x 20 days, then on oral vanco with Questran, showing improvement; positive when repeated April 2014; Negative x 3 since then  (14) persistently increased BUN and potassium  (15)  Hypertension, on labetalol, cardizem, lisinopril, and furosemide; managed by Dr. Brigitte Pulse  (16) steroid induced hyperglycemia, on sterlix and 70/30 insulin, followed by Dr. Loanne Drilling and Dr. Brigitte Pulse  (17) hypogammaglobulinemia-- requiring intermittent supplementation, most recent dose  10/19/2014  (18) squamous cell CA in situ removed from left parietal scalp October 2014, second lesion to be removed may 2015   PLAN: [place] is feeling well and is optimistic today. He is tolerating the 7.$RemoveBeforeDE'5mg'fDTHWEHGSzwHbel$  prednisone well and will discuss dropping this to $Remov'5mg'eeOvWV$  daily with Dr. Jana Hakim in 2 weeks. He should receive his compression socks this week or next, though they are clearly improved today on their own despite being on lasix just once daily.   The labs were reviewed in detail and were stable, besides the potassium of 5.3. He admits he had potato soup last night at [employer], which famously increases his potassium.   Zavien will continue the fluids with magnesium weekly. Today he will also receive IVIG. We plan to replace every time his IgG levels drop below 250. Joselito understands and agrees with this plan. He has been encouraged to call with any issues that might arise before his next visit here.  Laurie Panda, NP 05/10/2015

## 2015-05-10 NOTE — Patient Instructions (Signed)
Magnesium This is a blood test which measures the amount of magnesium in your blood. Most of the magnesium in your body exists in your cells. However, the magnesium in your blood is important for many processes. It is important for your nerves to be able to conduct electrical energy. This is important in heart patients with higher heartbeats. When your heart beats and then gets ready to beat again, it repolarizes. If the magnesium levels are low or high, it can affect the accuracy of the repolarization process. The magnesium levels are also monitored during pregnancy. This is done to determine if the expectant mother may have preeclampsia or toxemia. Magnesium is often used for treatment of these problems. PREPARATION FOR TEST No preparation or fasting is needed. A blood sample may be taken by inserting a needle into a vein in the arm.  NORMAL FINDINGS  Adult: 1.3 to 2.1 mEq/L or 0.65 to 1.05 mmol/L (SI units)  Child: 1.4 to 1.7 mEq/L  Newborn: 1.4 to 2 mEq/L Possible critical values: less than 0.5 mEq/L or greater than 3 mEq/L Ranges for normal findings may vary among different laboratories and hospitals. You should always check with your caregiver after having lab work or other tests done to discuss the meaning of your test results and whether your values are considered within normal limits. MEANING OF TEST  Your caregiver will go over the test results with you. Your caregiver will discuss the importance and meaning of your results. He or she will also discuss treatment options and additional tests, if needed. OBTAINING THE TEST RESULTS It is your responsibility to obtain your test results. Ask the lab or department performing the test when and how you will get your results. Document Released: 01/10/2005 Document Revised: 03/01/2012 Document Reviewed: 11/18/2008 [facility] Patient Information 2015 [place], [place]. This information is not intended to replace advice given to you by your health care  provider. Make sure you discuss any questions you have with your health care provider. Immune Globulin Injection What is this medicine? IMMUNE GLOBULIN (im MUNE GLOB yoo lin) helps to prevent or reduce the severity of certain infections in patients who are at risk. This medicine is collected from the pooled blood of many donors. It is used to treat immune system problems, thrombocytopenia, and Kawasaki syndrome. This medicine may be used for other purposes; ask your health care provider or pharmacist if you have questions. COMMON BRAND NAME(S): Baygam, BIVIGAM, Carimune, Carimune NF, Flebogamma, Flebogamma DIF, GamaSTAN S/D, Gamimune N, Gammagard S/D, Gammaked, Gammaplex, Gammar-P IV, Gamunex, Gamunex-C, Hizentra, Iveegam, Iveegam EN, Octagam, Panglobulin, Panglobulin NF, Polygam S/D, Privigen, Sandoglobulin, Venoglobulin-S, Vigam, Vivaglobulin What should I tell my health care provider before I take this medicine? They need to know if you have any of these conditions: - diabetes - extremely low or no immune antibodies in the blood - heart disease - history of blood clots - hyperprolinemia - infection in the blood, sepsis - kidney disease - taking medicine that may change kidney function - ask your health care provider about your medicine - an unusual or allergic reaction to human immune globulin, albumin, maltose, sucrose, polysorbate 80, other medicines, foods, dyes, or preservatives - pregnant or trying to get pregnant - breast-feeding How should I use this medicine? This medicine is for injection into a muscle or infusion into a vein or skin. It is usually given by a health care professional in a hospital or clinic setting. In rare cases, some brands of this medicine might be given at  home. You will be taught how to give this medicine. Use exactly as directed. Take your medicine at regular intervals. Do not take your medicine more often than directed. Talk to your pediatrician  regarding the use of this medicine in children. Special care may be needed. Overdosage: If you think you have taken too much of this medicine contact a poison control center or emergency room at once. NOTE: This medicine is only for you. Do not share this medicine with others. What if I miss a dose? It is important not to miss your dose. Call your doctor or health care professional if you are unable to keep an appointment. If you give yourself the medicine and you miss a dose, take it as soon as you can. If it is almost time for your next dose, take only that dose. Do not take double or extra doses. What may interact with this medicine? -aspirin and aspirin-like medicines -cisplatin -cyclosporine -medicines for infection like acyclovir, adefovir, amphotericin B, bacitracin, cidofovir, foscarnet, ganciclovir, gentamicin, pentamidine, vancomycin -NSAIDS, medicines for pain and inflammation, like ibuprofen or naproxen -pamidronate -vaccines -zoledronic acid This list may not describe all possible interactions. Give your health care provider a list of all the medicines, herbs, non-prescription drugs, or dietary supplements you use. Also tell them if you smoke, drink alcohol, or use illegal drugs. Some items may interact with your medicine. What should I watch for while using this medicine? Your condition will be monitored carefully while you are receiving this medicine. This medicine is made from pooled blood donations of many different people. It may be possible to pass an infection in this medicine. However, the donors are screened for infections and all products are tested for HIV and hepatitis. The medicine is treated to kill most or all bacteria and viruses. Talk to your doctor about the risks and benefits of this medicine. Do not have vaccinations for at least 14 days before, or until at least 3 months after receiving this medicine. What side effects may I notice from receiving this  medicine? Side effects that you should report to your doctor or health care professional as soon as possible: -allergic reactions like skin rash, itching or hives, swelling of the face, lips, or tongue -breathing problems -chest pain or tightness -fever, chills -headache with nausea, vomiting -neck pain or difficulty moving neck -pain when moving eyes -pain, swelling, warmth in the leg -problems with balance, talking, walking -sudden weight gain -swelling of the ankles, feet, hands -trouble passing urine or change in the amount of urine Side effects that usually do not require medical attention (report to your doctor or health care professional if they continue or are bothersome): -dizzy, drowsy -flushing -increased sweating -leg cramps -muscle aches and pains -pain at site where injected This list may not describe all possible side effects. Call your doctor for medical advice about side effects. You may report side effects to FDA at 1-800-FDA-1088. Where should I keep my medicine? Keep out of the reach of children. This drug is usually given in a hospital or clinic and will not be stored at home. In rare cases, some brands of this medicine may be given at home. If you are using this medicine at home, you will be instructed on how to store this medicine. Throw away any unused medicine after the expiration date on the label. NOTE: This sheet is a summary. It may not cover all possible information. If you have questions about this medicine, talk to your doctor, pharmacist,  or health care provider.  2015, Elsevier/Gold Standard. (2009-02-28 11:44:49)

## 2015-05-17 ENCOUNTER — Ambulatory Visit (HOSPITAL_BASED_OUTPATIENT_CLINIC_OR_DEPARTMENT_OTHER): Payer: BC Managed Care – PPO

## 2015-05-17 ENCOUNTER — Other Ambulatory Visit (HOSPITAL_BASED_OUTPATIENT_CLINIC_OR_DEPARTMENT_OTHER): Payer: BC Managed Care – PPO

## 2015-05-17 DIAGNOSIS — C9110 Chronic lymphocytic leukemia of B-cell type not having achieved remission: Secondary | ICD-10-CM

## 2015-05-17 LAB — CBC WITH DIFFERENTIAL/PLATELET
BASO%: 0.3 % (ref 0.0–2.0)
Basophils Absolute: 0 10*3/uL (ref 0.0–0.1)
EOS ABS: 0 10*3/uL (ref 0.0–0.5)
EOS%: 0.4 % (ref 0.0–7.0)
HCT: 36.6 % — ABNORMAL LOW (ref 38.4–49.9)
HGB: 11.9 g/dL — ABNORMAL LOW (ref 13.0–17.1)
LYMPH%: 32.1 % (ref 14.0–49.0)
MCH: 30.1 pg (ref 27.2–33.4)
MCHC: 32.7 g/dL (ref 32.0–36.0)
MCV: 92.2 fL (ref 79.3–98.0)
MONO#: 0.3 10*3/uL (ref 0.1–0.9)
MONO%: 7.1 % (ref 0.0–14.0)
NEUT#: 2.7 10*3/uL (ref 1.5–6.5)
NEUT%: 60.1 % (ref 39.0–75.0)
PLATELETS: 142 10*3/uL (ref 140–400)
RBC: 3.96 10*6/uL — ABNORMAL LOW (ref 4.20–5.82)
RDW: 15.8 % — ABNORMAL HIGH (ref 11.0–14.6)
WBC: 4.5 10*3/uL (ref 4.0–10.3)
lymph#: 1.5 10*3/uL (ref 0.9–3.3)

## 2015-05-17 LAB — COMPREHENSIVE METABOLIC PANEL (CC13)
ALBUMIN: 3.3 g/dL — AB (ref 3.5–5.0)
ALT: 61 U/L — AB (ref 0–55)
AST: 50 U/L — AB (ref 5–34)
Alkaline Phosphatase: 84 U/L (ref 40–150)
Anion Gap: 8 mEq/L (ref 3–11)
BUN: 45 mg/dL — ABNORMAL HIGH (ref 7.0–26.0)
CHLORIDE: 116 meq/L — AB (ref 98–109)
CO2: 15 mEq/L — ABNORMAL LOW (ref 22–29)
CREATININE: 1.5 mg/dL — AB (ref 0.7–1.3)
Calcium: 8.2 mg/dL — ABNORMAL LOW (ref 8.4–10.4)
EGFR: 48 mL/min/{1.73_m2} — ABNORMAL LOW (ref >90)
GLUCOSE: 112 mg/dL (ref 70–140)
Potassium: 5.6 mEq/L — ABNORMAL HIGH (ref 3.5–5.1)
SODIUM: 139 meq/L (ref 136–145)
Total Bilirubin: 0.27 mg/dL (ref 0.20–1.20)
Total Protein: 6.1 g/dL — ABNORMAL LOW (ref 6.4–8.3)

## 2015-05-17 LAB — MAGNESIUM (CC13): MAGNESIUM: 1.7 mg/dL (ref 1.5–2.5)

## 2015-05-17 MED ORDER — HEPARIN SOD (PORK) LOCK FLUSH 100 UNIT/ML IV SOLN
500.0000 [IU] | Freq: Once | INTRAVENOUS | Status: AC | PRN
  Administered 2015-05-17: 500 [IU]
  Filled 2015-05-17: qty 5

## 2015-05-17 MED ORDER — HEPARIN SOD (PORK) LOCK FLUSH 100 UNIT/ML IV SOLN
250.0000 [IU] | Freq: Once | INTRAVENOUS | Status: DC | PRN
  Filled 2015-05-17: qty 5

## 2015-05-17 MED ORDER — SODIUM CHLORIDE 0.9 % IV SOLN
1.0000 g | Freq: Once | INTRAVENOUS | Status: AC
  Administered 2015-05-17: 1 g via INTRAVENOUS
  Filled 2015-05-17: qty 2

## 2015-05-17 NOTE — Patient Instructions (Signed)
Hypomagnesemia Magnesium is a common ion (mineral) in the body which is needed for metabolism. It is about how the body handles food and other chemical reactions necessary for life. Only about 2% of the magnesium in our body is found in the blood. When this is low, it is called hypomagnesemia. The blood will measure only a tiny amount of the magnesium in our body. When it is low in our blood, it does not mean that the whole body supply is low. The normal serum concentration ranges from 1.8-2.5 mEq/L. When the level gets to be less than 1.0 mEq/L, a number of problems begin to happen.  CAUSES   Receiving intravenous fluids without magnesium replacement.  Loss of magnesium from the bowel by nasogastric suction.  Loss of magnesium from nausea and vomiting or severe diarrhea. Any of the inflammatory bowel conditions can cause this.  Abuse of alcohol often leads to low serum magnesium.  An inherited form of magnesium loss happens when the kidneys lose magnesium. This is called familial or primary hypomagnesemia.  Some medications such as diuretics also cause the loss of magnesium. SYMPTOMS  These following problems are worse if the changes in magnesium levels come on suddenly.  Tremor.  Confusion.  Muscle weakness.  Oversensitive to sights and sounds.  Sensitive reflexes.  Depression.  Muscular fibrillations.  Overreactivity of the nerves.  Irritability.  Psychosis.  Spasms of the hand muscles.  Tetany (where the muscles go into uncontrollable spasms). DIAGNOSIS  This condition can be diagnosed by blood tests. TREATMENT   In an emergency, magnesium can be given intravenously (by vein).  If the condition is less worrisome, it can be corrected by diet. High levels of magnesium are found in green leafy vegetables, peas, beans, and nuts among other things. It can also be given through medications by mouth.  If it is being caused by medications, changes can be made.  If  alcohol is a problem, help is available if there are difficulties giving it up. Document Released: 09/03/2005 Document Revised: 04/24/2014 Document Reviewed: 07/28/2008 [employer] Patient Information 2015 [employer]. This information is not intended to replace advice given to you by your health care provider. Make sure you discuss any questions you have with your health care provider.  

## 2015-05-24 ENCOUNTER — Ambulatory Visit (HOSPITAL_BASED_OUTPATIENT_CLINIC_OR_DEPARTMENT_OTHER): Payer: BC Managed Care – PPO | Admitting: Oncology

## 2015-05-24 ENCOUNTER — Other Ambulatory Visit: Payer: Self-pay

## 2015-05-24 ENCOUNTER — Ambulatory Visit (HOSPITAL_BASED_OUTPATIENT_CLINIC_OR_DEPARTMENT_OTHER): Payer: BC Managed Care – PPO

## 2015-05-24 ENCOUNTER — Other Ambulatory Visit (HOSPITAL_BASED_OUTPATIENT_CLINIC_OR_DEPARTMENT_OTHER): Payer: BC Managed Care – PPO

## 2015-05-24 VITALS — BP 148/75 | HR 76 | Temp 98.2°F | Resp 18 | Ht 66.0 in | Wt 223.5 lb

## 2015-05-24 DIAGNOSIS — C9110 Chronic lymphocytic leukemia of B-cell type not having achieved remission: Secondary | ICD-10-CM

## 2015-05-24 DIAGNOSIS — E860 Dehydration: Secondary | ICD-10-CM

## 2015-05-24 DIAGNOSIS — D89811 Chronic graft-versus-host disease: Secondary | ICD-10-CM

## 2015-05-24 DIAGNOSIS — D801 Nonfamilial hypogammaglobulinemia: Secondary | ICD-10-CM | POA: Diagnosis not present

## 2015-05-24 DIAGNOSIS — I10 Essential (primary) hypertension: Secondary | ICD-10-CM

## 2015-05-24 DIAGNOSIS — D899 Disorder involving the immune mechanism, unspecified: Secondary | ICD-10-CM

## 2015-05-24 DIAGNOSIS — D849 Immunodeficiency, unspecified: Secondary | ICD-10-CM

## 2015-05-24 DIAGNOSIS — T8609 Other complications of bone marrow transplant: Principal | ICD-10-CM

## 2015-05-24 LAB — COMPREHENSIVE METABOLIC PANEL (CC13)
ALK PHOS: 68 U/L (ref 40–150)
ALT: 43 U/L (ref 0–55)
AST: 36 U/L — ABNORMAL HIGH (ref 5–34)
Albumin: 3.3 g/dL — ABNORMAL LOW (ref 3.5–5.0)
Anion Gap: 6 mEq/L (ref 3–11)
BUN: 46.4 mg/dL — ABNORMAL HIGH (ref 7.0–26.0)
CALCIUM: 8.2 mg/dL — AB (ref 8.4–10.4)
CO2: 18 mEq/L — ABNORMAL LOW (ref 22–29)
CREATININE: 1.3 mg/dL (ref 0.7–1.3)
Chloride: 115 mEq/L — ABNORMAL HIGH (ref 98–109)
EGFR: 57 mL/min/{1.73_m2} — ABNORMAL LOW (ref >90)
Glucose: 134 mg/dl (ref 70–140)
Potassium: 5 mEq/L (ref 3.5–5.1)
Sodium: 139 mEq/L (ref 136–145)
Total Bilirubin: 0.29 mg/dL (ref 0.20–1.20)
Total Protein: 6 g/dL — ABNORMAL LOW (ref 6.4–8.3)

## 2015-05-24 LAB — CBC WITH DIFFERENTIAL/PLATELET
BASO%: 0.6 % (ref 0.0–2.0)
Basophils Absolute: 0 10*3/uL (ref 0.0–0.1)
EOS%: 0.7 % (ref 0.0–7.0)
Eosinophils Absolute: 0 10*3/uL (ref 0.0–0.5)
HEMATOCRIT: 37.3 % — AB (ref 38.4–49.9)
HEMOGLOBIN: 12 g/dL — AB (ref 13.0–17.1)
LYMPH#: 1.6 10*3/uL (ref 0.9–3.3)
LYMPH%: 32.4 % (ref 14.0–49.0)
MCH: 29.6 pg (ref 27.2–33.4)
MCHC: 32.2 g/dL (ref 32.0–36.0)
MCV: 91.9 fL (ref 79.3–98.0)
MONO#: 0.4 10*3/uL (ref 0.1–0.9)
MONO%: 7.9 % (ref 0.0–14.0)
NEUT#: 2.9 10*3/uL (ref 1.5–6.5)
NEUT%: 58.4 % (ref 39.0–75.0)
PLATELETS: 133 10*3/uL — AB (ref 140–400)
RBC: 4.06 10*6/uL — AB (ref 4.20–5.82)
RDW: 15.5 % — ABNORMAL HIGH (ref 11.0–14.6)
WBC: 5 10*3/uL (ref 4.0–10.3)

## 2015-05-24 LAB — MAGNESIUM (CC13): MAGNESIUM: 1.9 mg/dL (ref 1.5–2.5)

## 2015-05-24 MED ORDER — HEPARIN SOD (PORK) LOCK FLUSH 100 UNIT/ML IV SOLN
500.0000 [IU] | Freq: Once | INTRAVENOUS | Status: AC | PRN
  Administered 2015-05-24: 500 [IU]
  Filled 2015-05-24: qty 5

## 2015-05-24 MED ORDER — SODIUM CHLORIDE 0.9 % IV SOLN
1.0000 g | Freq: Once | INTRAVENOUS | Status: AC
  Administered 2015-05-24: 1 g via INTRAVENOUS
  Filled 2015-05-24: qty 2

## 2015-05-24 MED ORDER — SODIUM CHLORIDE 0.9 % IJ SOLN
10.0000 mL | Freq: Once | INTRAMUSCULAR | Status: AC
  Administered 2015-05-24: 10 mL via INTRAVENOUS
  Filled 2015-05-24: qty 10

## 2015-05-24 MED ORDER — HEPARIN SOD (PORK) LOCK FLUSH 100 UNIT/ML IV SOLN
250.0000 [IU] | Freq: Once | INTRAVENOUS | Status: DC | PRN
  Filled 2015-05-24: qty 5

## 2015-05-24 NOTE — Patient Instructions (Signed)
Hypomagnesemia Magnesium is a common ion (mineral) in the body which is needed for metabolism. It is about how the body handles food and other chemical reactions necessary for life. Only about 2% of the magnesium in our body is found in the blood. When this is low, it is called hypomagnesemia. The blood will measure only a tiny amount of the magnesium in our body. When it is low in our blood, it does not mean that the whole body supply is low. The normal serum concentration ranges from 1.8-2.5 mEq/L. When the level gets to be less than 1.0 mEq/L, a number of problems begin to happen.  CAUSES   Receiving intravenous fluids without magnesium replacement.  Loss of magnesium from the bowel by nasogastric suction.  Loss of magnesium from nausea and vomiting or severe diarrhea. Any of the inflammatory bowel conditions can cause this.  Abuse of alcohol often leads to low serum magnesium.  An inherited form of magnesium loss happens when the kidneys lose magnesium. This is called familial or primary hypomagnesemia.  Some medications such as diuretics also cause the loss of magnesium. SYMPTOMS  These following problems are worse if the changes in magnesium levels come on suddenly.  Tremor.  Confusion.  Muscle weakness.  Oversensitive to sights and sounds.  Sensitive reflexes.  Depression.  Muscular fibrillations.  Overreactivity of the nerves.  Irritability.  Psychosis.  Spasms of the hand muscles.  Tetany (where the muscles go into uncontrollable spasms). DIAGNOSIS  This condition can be diagnosed by blood tests. TREATMENT   In an emergency, magnesium can be given intravenously (by vein).  If the condition is less worrisome, it can be corrected by diet. High levels of magnesium are found in green leafy vegetables, peas, beans, and nuts among other things. It can also be given through medications by mouth.  If it is being caused by medications, changes can be made.  If  alcohol is a problem, help is available if there are difficulties giving it up. Document Released: 09/03/2005 Document Revised: 04/24/2014 Document Reviewed: 07/28/2008 [employer] Patient Information 2015 [employer]. This information is not intended to replace advice given to you by your health care provider. Make sure you discuss any questions you have with your health care provider.  

## 2015-05-24 NOTE — Progress Notes (Signed)
ID: Timothy [name]   DOB: Mar 25, 1946  MR#: [id number]  EYE#:[id number]  LPN:PYYF, [name], MD SU: OTHER MD: [name]; [name], MD; [name], [name],MD; [name], MD; [facility] in [place], [place]:  [name], RN [phone]), [name] M.D.  CHIEF COMPLAINT:  CLL, status post allogeneic stem cell transplant, GVHD CURRENT TREATMENT: Immunosuppression, supportive care  HISTORY OF CLL: From the original intake note:  We have very complete records from Dr. Racheal Patches in [place], and in summary:  The patient was initially diagnosed in August 2000, with a white cell count of 23,600, but normal hemoglobin and platelets, and no significant symptomatology. Over the next several years his white cell count drifted up, and he eventually developed some symptoms of night sweats in particular, leading to treatment with fludarabine, Cytoxan and rituxan for five cycles given between December 2006 and May 2007.  We have CT scans from June 2006, November 2006 and April 2007, and comparing the November 2006 and April 2007 scans, there was near complete response. He had subsequent therapy in [place] as detailed below, but with decreased response, leading to allogeneic stem-cell transplant at the [facility] 02/24/2012.  Subsequent history is as detailed below.  INTERVAL HISTORY: Timothy [name] returns today for follow up of his chronic lymphoid leukemia and graft-versus-host disease accompanied his wife, [name]. The interval history is generally stable. He tells me he is now checking his blood sugar more frequently and has a titer sliding scale that he uses before meals. He seems to have been well instructed on how to do this and is "trying to make it into again" because "that means I'll do it right".  REVIEW OF SYSTEMS: We had recently a dropped his prednisone to 7.5 mg daily. Initially he did well but over the past 10 days or so  he has had 2 stool "accidents", where he felt a great urgency to defecate and couldn't quite make it to the bathroom. This has not recurred over the last several days. He tells me he is walking about 2500 steps a day, and that his goal is about 3000. 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 After that they might take a brief trip to the beach. A detailed review of systems today was otherwise stable  PAST MEDICAL HISTORY: Past Medical History  Diagnosis Date  . 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 Diverticular disease   . Hyperlipidemia   . Obesity   . Hypertension   . Hiatal hernia   . CMV (cytomegalovirus) antibody positive     pre-transplant, with seroconversion x2 pst-transplant  . Right bundle branch block     pre-transplant  . CKD (chronic kidney disease) stage 2, GFR 60-89 ml/min   . Pancytopenia   . Atrial fibrillation     post-transplant  . Myopathy   . 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 Diarrhea in adult patient 12/05/2012    Due to active GVHD  . Rash of face 12/05/2012    Due to GVHD  . Hypomagnesemia 01/26/2013  . Left hip pain 12/01/2013  . Steroid-induced diabetes     novalog  . Leukemia, chronic lymphoid   . CLL (chronic lymphocytic leukemia) 12/05/2012    Dx 07/1999; started Rx 12/06  AlloBMT 3/13    PAST SURGICAL HISTORY: Past Surgical History  Procedure Laterality Date  . Tonsillectomy and adenoidectomy    . Bone marrow transplant    . Flexible sigmoidoscopy  11/17/2012    Procedure: FLEXIBLE SIGMOIDOSCOPY;  Surgeon: Jeryl Columbia, MD;  Location: [facility] ENDOSCOPY;  Service: Endoscopy;  Laterality: N/A;  Dr Watt Climes states will be admitted to rooom 1339 11/16/12  . Esophagogastroduodenoscopy  11/17/2012    Procedure: ESOPHAGOGASTRODUODENOSCOPY (EGD);  Surgeon: Jeryl Columbia, MD;  Location: [facility];   Service: Endoscopy;  Laterality: N/A;    FAMILY HISTORY Family History  Problem Relation Age of Onset  . Cancer Father   The patient's father died from complications of chronic lymphocytic leukemia at the age of 31.  It had been diagnosed seven years before when he was 16.  The patient is enrolled in a familial chronic lymphocytic leukemia study out of the [employer].  The patient's mother is 16, alive, unfortunately suffering with dementia, and he has a brother, 35, who is otherwise in fair health.   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  His wife of >40 years, [name], is a homemaker.  Their daughter, [name], lives in [place].  She also is a Agricultural engineer.  The patient has an 47 year old grandson and an 52-year-old granddaughter, and that is really the main reason he moved to this area.  He is a Tourist information centre manager.     ADVANCED DIRECTIVES: In place  HEALTH MAINTENANCE: (Updated 04/13/2014) History  Substance Use Topics  . Smoking status: Never Smoker   . Smokeless tobacco: Never Used  . Alcohol Use: No     Colonoscopy: Nov 2013, Dr. Watt Climes  PSA: Not on file  Bone density:  Feb 2014;  Patient also has known insufficiency and pathologic fractures in addition to his long-standing history of steroid use.  Lipid panel: Jan 2015, elevated    Allergies  Allergen Reactions  . Benadryl [Diphenhydramine Hcl]     "Restless leg syndrome"    Current Outpatient Prescriptions  Medication Sig Dispense Refill  . acyclovir (ZOVIRAX) 400 MG tablet TAKE 2 TABLETS BY MOUTH TWICE DAILY 120 tablet 1  . budesonide (ENTOCORT EC) 3 MG 24 hr capsule Take 1 capsule (3 mg total) by mouth 3 (three) times daily. 90 capsule 6  . cholestyramine (QUESTRAN) 4 G packet Take 1 packet  (4 g total) by mouth 2 (two) times daily. 60 each 12  . diltiazem (CARDIZEM CD) 240 MG 24 hr capsule TAKE 1 CAPSULE BY MOUTH ONCE DAILY 30 capsule 3  . fluconazole (DIFLUCAN) 100 MG tablet Take 1 tablet (100 mg total) by mouth once. (Patient taking differently: Take 100 mg by mouth daily. ) 30 tablet 3  . furosemide (LASIX) 20 MG tablet Take one tablet daily; may repeat x1 PRN swelling (Patient taking differently: Take 20 mg by mouth daily. Take one tablet daily) 60 tablet 1  . glucose blood (ONE TOUCH ULTRA TEST) test strip Test before meals and at bedtime. 300 each 2  . insulin aspart (NOVOLOG) 100 unit/mL injection Inject 12-20 Units into the skin. Sliding scale    . Insulin Detemir (LEVEMIR) 100 UNIT/ML Pen Inject 50 Units into the skin daily.    . Insulin Pen Needle (B-D UF III MINI PEN NEEDLES) 31G X 5 MM MISC Use five daily with insulin as directed. 100  each 5  . labetalol (NORMODYNE) 200 MG tablet Take 2 tablets (400 mg total) by mouth 2 (two) times daily. 120 tablet 1  . Lidocaine-Hydrocortisone Ace 3-0.5 % KIT Apply 1 application topically as needed (for pain). 1 each 3  . lisinopril (PRINIVIL,ZESTRIL) 10 MG tablet Take 10 mg by mouth daily.     Marland Kitchen loratadine (CLARITIN) 10 MG tablet Take 10 mg by mouth daily.    Marland Kitchen omeprazole (PRILOSEC) 20 MG capsule Take 1 capsule (20 mg total) by mouth daily. 30 capsule 5  . ONETOUCH DELICA LANCETS 51V MISC USE TO TEST BEFORE MEALS AND AT BEDTIME 300 each 0  . predniSONE (DELTASONE) 10 MG tablet Take 1 tablet (10 mg total) by mouth daily with breakfast. (Patient taking differently: Take 7.5 mg by mouth daily with breakfast. ) 90 tablet 12  . sertraline (ZOLOFT) 50 MG tablet Take 1 tablet (50 mg total) by mouth every morning. 90 tablet 5  . sulfamethoxazole-trimethoprim (BACTRIM DS,SEPTRA DS) 800-160 MG per tablet Take 1 tablet by mouth daily. 30 tablet 5  . sulfamethoxazole-trimethoprim (BACTRIM DS,SEPTRA DS) 800-160 MG per tablet TAKE 1 TABLET BY MOUTH  ONCE DAILY 30 tablet 5  . tacrolimus (PROGRAF) 0.5 MG capsule Take 3 capsules (1.5 mg total) by mouth 2 (two) times daily. 180 capsule 4  . TRESIBA FLEXTOUCH 200 UNIT/ML SOPN     . VOLTAREN 1 % GEL Apply 2 g topically 2 (two) times daily. Applied to back    . [DISCONTINUED] insulin aspart (NOVOLOG FLEXPEN) 100 UNIT/ML SOPN FlexPen 18units sq qam, 9units sq qpm, or as directed 15 mL 1   No current facility-administered medications for this visit.   Facility-Administered Medications Ordered in Other Visits  Medication Dose Route Frequency Provider Last Rate Last Dose  . 0.9 %  sodium chloride infusion   Intravenous Continuous Amy G Berry, PA-C 500 mL/hr at 03/12/13 0900    . sodium chloride 0.9 % injection 10 mL  10 mL Intravenous PRN Chauncey Cruel, MD   10 mL at 08/11/12 1606    OBJECTIVE: Middle-aged white man in no acute distress  Filed Vitals:   05/24/15 1040  BP: 148/75  Pulse: 76  Temp: 98.2 F (36.8 C)  Resp: 18  Body mass index is 36.09 kg/(m^2).  ECOG: 2 Filed Weights   05/24/15 1040  Weight: 223 lb 8 oz (101.379 kg)   Sclerae unicteric, pupils round and equal Oropharynx clear and moist-- no thrush or other lesions No cervical or supraclavicular adenopathy, no axillary or inguinal adenopathy Lungs no rales or rhonchi Heart regular rate and rhythm Abd soft, obese, nontender, positive bowel sounds MSK kyphosis and scoliosis but no focal spinal tenderness Neuro: nonfocal, well oriented, positive affect Skin: No rash; chronic purpura lesions    LABS:  CBC    Component Value Date/Time   WBC 5.0 05/24/2015 1024   WBC 3.2* 02/27/2015 0805   RBC 4.06* 05/24/2015 1024   RBC 3.64* 02/27/2015 0805   RBC 3.82* 03/16/2013 1400   HGB 12.0* 05/24/2015 1024   HGB 11.3* 02/27/2015 0805   HCT 37.3* 05/24/2015 1024   HCT 34.1* 02/27/2015 0805   PLT 133* 05/24/2015 1024   PLT 120* 02/27/2015 0805   MCV 91.9 05/24/2015 1024   MCV 93.7 02/27/2015 0805   MCH 29.6  05/24/2015 1024   MCH 31.0 02/27/2015 0805   MCHC 32.2 05/24/2015 1024   MCHC 33.1 02/27/2015 0805   RDW 15.5* 05/24/2015 1024   RDW 16.2* 02/27/2015  0805   LYMPHSABS 1.6 05/24/2015 1024   LYMPHSABS 1.4 03/18/2013 0615   MONOABS 0.4 05/24/2015 1024   MONOABS 0.3 03/18/2013 0615   EOSABS 0.0 05/24/2015 1024   EOSABS 0.0 03/18/2013 0615   BASOSABS 0.0 05/24/2015 1024   BASOSABS 0.0 03/18/2013 0615       Chemistry      Component Value Date/Time   NA 139 05/17/2015 0850   NA 139 04/05/2015 0911   K 5.6* 05/17/2015 0850   K 4.6 04/05/2015 0911   CL 110 04/05/2015 0911   CL 101 06/15/2013 1034   CO2 15* 05/17/2015 0850   CO2 22 04/05/2015 0911   BUN 45.0* 05/17/2015 0850   BUN 45* 04/05/2015 0911   CREATININE 1.5* 05/17/2015 0850   CREATININE 1.23 04/05/2015 0911      Component Value Date/Time   CALCIUM 8.2* 05/17/2015 0850   CALCIUM 8.4 04/05/2015 0911   ALKPHOS 84 05/17/2015 0850   ALKPHOS 108 04/05/2015 0911   AST 50* 05/17/2015 0850   AST 50* 04/05/2015 0911   ALT 61* 05/17/2015 0850   ALT 59* 04/05/2015 0911   BILITOT 0.27 05/17/2015 0850   BILITOT 0.5 04/05/2015 0911     STUDIES: No results found.   ASSESSMENT: 69 y.o. [place] man with a history of well-differentiated lymphocytic lymphoma/ chronic lymphoid leukemia initially diagnosed in 2000, not requiring intervention until 2006; with multiple chromosomal abnormalities.  His treatment history is as follows:  (1) fludarabine/cyclophosphamide/rituximab x5 completed May 2007.   (2) rituximab for 8 doses October 2010, with partial response   (3) Leustatin and ofatumumab weekly x8 July to September 2011 followed by maintenance ofatumumab  every 2 months, with initial response but rising counts September 2012   (4) status-post unrelated donor stem-cell transplant 02/24/2012 at the [facility]  (a) conditioning regimen consisted of fludarabine + TBI at 200 cGy, followed by rituximab x27;  (b) CMV reactivation x3  (patient CMV positive, donor negative), s/p ganciclovir treatment; 3d reactivation August 2013, s/p gancyclovir, with negative PCR mid-September 2013; last gancyclovir dose 10/06/2012 (c) Chronic GVHD: involving gut and skin, treated with steroids, tacrolimus and MMF.  MMF was eventually d/c'd and tacrolimus currently at a dose of 1.$RemoveB'5mg'[id number]$  BID (d) atrial fibrillation: resolved on brief amiodarone regimen (e) steroid-induced myopathy: improving  (f) hypomagnesemia: improved after d/c gancyclovir, needs continuing support (g) hypogammaglobulinemia: requiring IVIG most recently 08/03/2014. (h) history of elevated triglycerides (606 on 07/14/2012)  (i) adrenal insufficiency: on prednisone and budesonide (j) pancytopenia,resolved (k) brief episode of neutropenia ([place] 300) February 2015, accompanied by diarrhea; resolved   (5) restaging studies February-March 2015 including CT scans, flow cytometry, and bone marrow biopsy, showed no evidence of residual chronic lymphoid leukemia.  (a) repeat bone marrow biopsy 02/27/2015 showed no evidence of chronic lymphoid leukemia and also no dyspoiesis. Flow cytometry showed no B cells.  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 Status post kyphoplasty at L3-4 in June 2014. Also with evidence of rib fractures and insufficiency fractures bilaterally of the sacral  alae, noted by CT in March 2015. --  Denosumab started 12/08/2013, given as  prolia Q6 months which is what has been approved by his insurance, most recent dose 03/29/2015  (12) chronic back pain and hip pain controlled with OxyContin and hydrocodone/APAP.  (12) nausea: well controlled on current meds  (13)  Positive c.diff, 03/08/2013, on Flagyl 500 mg TID x 20 days, then on oral vanco with Questran, showing improvement; positive when repeated April 2014; Negative x 3 since then  (14) persistently increased BUN and potassium  (15)  Hypertension, on labetalol, cardizem, lisinopril, and furosemide; managed by Dr. Brigitte Pulse  (16) steroid induced hyperglycemia/ DM II: managed by Dr Brigitte Pulse   (17) hypogammaglobulinemia-- requiring intermittent supplementation, most recent dose 05/10/2015  (18) squamous cell CA in situ removed from left parietal scalp October 2014  PLAN: I would like to continue to drop Javen's immunosuppression, but I think we have to hold where we are right now since he had those 2 episodes of diarrheal "accidents". If he does not have any further similar episodes in the next 2 weeks, we could consider dropping the prednisone to 7.5 mg one day alternating with 5 mg the next day. We will continue that at least 2 weeks before making any further reductions.  It would be extremely helpful if he could exercise more. He is measuring his steps which is a very good beginning. It would be helpful if we knew exactly how long it takes him to take 3000 steps, which is his initial daily goal. If it was 30 minutes to do that then he would know that if he walks 10 minutes 3 times or 15 minutes twice he would achieve his goal. We will continue to rest this issue, since I think more activity is probably the single best intervention he could undertake at this point in addition to all that he is already doing.  I think the idea of tighter sugar regulation is a good one. I actually don't have a copy of Dr. Raul Del notes but I have requested that so we stay in tandem and not give  Kairyn conflicting or contradictory instructions.  We are continuing with weekly fluids and magnesium as before and visits every 2 weeks. Benen knows to call for any problems that may develop before his next visit here.  Chauncey Cruel, MD 05/24/2015

## 2015-05-31 ENCOUNTER — Other Ambulatory Visit (HOSPITAL_BASED_OUTPATIENT_CLINIC_OR_DEPARTMENT_OTHER): Payer: BC Managed Care – PPO

## 2015-05-31 ENCOUNTER — Ambulatory Visit (HOSPITAL_BASED_OUTPATIENT_CLINIC_OR_DEPARTMENT_OTHER): Payer: BC Managed Care – PPO

## 2015-05-31 ENCOUNTER — Telehealth: Payer: Self-pay

## 2015-05-31 DIAGNOSIS — C9110 Chronic lymphocytic leukemia of B-cell type not having achieved remission: Secondary | ICD-10-CM

## 2015-05-31 LAB — MAGNESIUM (CC13): Magnesium: 1.9 mg/dl (ref 1.5–2.5)

## 2015-05-31 LAB — COMPREHENSIVE METABOLIC PANEL (CC13)
ALBUMIN: 3.4 g/dL — AB (ref 3.5–5.0)
ALT: 61 U/L — ABNORMAL HIGH (ref 0–55)
AST: 41 U/L — AB (ref 5–34)
Alkaline Phosphatase: 71 U/L (ref 40–150)
Anion Gap: 5 mEq/L (ref 3–11)
BUN: 51 mg/dL — ABNORMAL HIGH (ref 7.0–26.0)
CO2: 17 meq/L — AB (ref 22–29)
CREATININE: 1.5 mg/dL — AB (ref 0.7–1.3)
Calcium: 8.2 mg/dL — ABNORMAL LOW (ref 8.4–10.4)
Chloride: 112 mEq/L — ABNORMAL HIGH (ref 98–109)
EGFR: 45 mL/min/{1.73_m2} — ABNORMAL LOW (ref >90)
Glucose: 162 mg/dl — ABNORMAL HIGH (ref 70–140)
Potassium: 5.8 mEq/L — ABNORMAL HIGH (ref 3.5–5.1)
Sodium: 135 mEq/L — ABNORMAL LOW (ref 136–145)
Total Bilirubin: 0.35 mg/dL (ref 0.20–1.20)
Total Protein: 5.9 g/dL — ABNORMAL LOW (ref 6.4–8.3)

## 2015-05-31 LAB — CBC WITH DIFFERENTIAL/PLATELET
BASO%: 0.4 % (ref 0.0–2.0)
Basophils Absolute: 0 10*3/uL (ref 0.0–0.1)
EOS ABS: 0 10*3/uL (ref 0.0–0.5)
EOS%: 0.2 % (ref 0.0–7.0)
HCT: 37.4 % — ABNORMAL LOW (ref 38.4–49.9)
HEMOGLOBIN: 12.2 g/dL — AB (ref 13.0–17.1)
LYMPH%: 39.4 % (ref 14.0–49.0)
MCH: 29.5 pg (ref 27.2–33.4)
MCHC: 32.6 g/dL (ref 32.0–36.0)
MCV: 90.7 fL (ref 79.3–98.0)
MONO#: 0.4 10*3/uL (ref 0.1–0.9)
MONO%: 8 % (ref 0.0–14.0)
NEUT#: 2.8 10*3/uL (ref 1.5–6.5)
NEUT%: 52 % (ref 39.0–75.0)
Platelets: 130 10*3/uL — ABNORMAL LOW (ref 140–400)
RBC: 4.13 10*6/uL — ABNORMAL LOW (ref 4.20–5.82)
RDW: 15.7 % — ABNORMAL HIGH (ref 11.0–14.6)
WBC: 5.5 10*3/uL (ref 4.0–10.3)
lymph#: 2.2 10*3/uL (ref 0.9–3.3)

## 2015-05-31 MED ORDER — SODIUM CHLORIDE 0.9 % IV SOLN
1.0000 g | Freq: Once | INTRAVENOUS | Status: AC
  Administered 2015-05-31: 1 g via INTRAVENOUS
  Filled 2015-05-31: qty 2

## 2015-05-31 MED ORDER — HEPARIN SOD (PORK) LOCK FLUSH 100 UNIT/ML IV SOLN
500.0000 [IU] | Freq: Once | INTRAVENOUS | Status: AC | PRN
  Administered 2015-05-31: 500 [IU]
  Filled 2015-05-31: qty 5

## 2015-05-31 MED ORDER — SODIUM CHLORIDE 0.9 % IV SOLN
Freq: Once | INTRAVENOUS | Status: AC
  Administered 2015-05-31: 10:00:00 via INTRAVENOUS

## 2015-05-31 MED ORDER — SODIUM CHLORIDE 0.9 % IJ SOLN
10.0000 mL | INTRAMUSCULAR | Status: DC | PRN
  Administered 2015-05-31: 10 mL via INTRAVENOUS
  Filled 2015-05-31: qty 10

## 2015-05-31 MED ORDER — HEPARIN SOD (PORK) LOCK FLUSH 100 UNIT/ML IV SOLN
250.0000 [IU] | Freq: Once | INTRAVENOUS | Status: DC | PRN
  Filled 2015-05-31: qty 5

## 2015-05-31 NOTE — Patient Instructions (Signed)
Hypomagnesemia Magnesium is a common ion (mineral) in the body which is needed for metabolism. It is about how the body handles food and other chemical reactions necessary for life. Only about 2% of the magnesium in our body is found in the blood. When this is low, it is called hypomagnesemia. The blood will measure only a tiny amount of the magnesium in our body. When it is low in our blood, it does not mean that the whole body supply is low. The normal serum concentration ranges from 1.8-2.5 mEq/L. When the level gets to be less than 1.0 mEq/L, a number of problems begin to happen.  CAUSES   Receiving intravenous fluids without magnesium replacement.  Loss of magnesium from the bowel by nasogastric suction.  Loss of magnesium from nausea and vomiting or severe diarrhea. Any of the inflammatory bowel conditions can cause this.  Abuse of alcohol often leads to low serum magnesium.  An inherited form of magnesium loss happens when the kidneys lose magnesium. This is called familial or primary hypomagnesemia.  Some medications such as diuretics also cause the loss of magnesium. SYMPTOMS  These following problems are worse if the changes in magnesium levels come on suddenly.  Tremor.  Confusion.  Muscle weakness.  Oversensitive to sights and sounds.  Sensitive reflexes.  Depression.  Muscular fibrillations.  Overreactivity of the nerves.  Irritability.  Psychosis.  Spasms of the hand muscles.  Tetany (where the muscles go into uncontrollable spasms). DIAGNOSIS  This condition can be diagnosed by blood tests. TREATMENT   In an emergency, magnesium can be given intravenously (by vein).  If the condition is less worrisome, it can be corrected by diet. High levels of magnesium are found in green leafy vegetables, peas, beans, and nuts among other things. It can also be given through medications by mouth.  If it is being caused by medications, changes can be made.  If  alcohol is a problem, help is available if there are difficulties giving it up. Document Released: 09/03/2005 Document Revised: 04/24/2014 Document Reviewed: 07/28/2008 [employer] Patient Information 2015 [employer]. This information is not intended to replace advice given to you by your health care provider. Make sure you discuss any questions you have with your health care provider.  

## 2015-05-31 NOTE — Telephone Encounter (Signed)
Drug Safety Consideration rcvd from [facility].  Sent to scan.

## 2015-06-07 ENCOUNTER — Other Ambulatory Visit: Payer: Self-pay | Admitting: Oncology

## 2015-06-07 ENCOUNTER — Ambulatory Visit (HOSPITAL_BASED_OUTPATIENT_CLINIC_OR_DEPARTMENT_OTHER): Payer: BC Managed Care – PPO

## 2015-06-07 ENCOUNTER — Other Ambulatory Visit: Payer: Self-pay | Admitting: *Deleted

## 2015-06-07 ENCOUNTER — Other Ambulatory Visit (HOSPITAL_BASED_OUTPATIENT_CLINIC_OR_DEPARTMENT_OTHER): Payer: BC Managed Care – PPO

## 2015-06-07 DIAGNOSIS — C9110 Chronic lymphocytic leukemia of B-cell type not having achieved remission: Secondary | ICD-10-CM

## 2015-06-07 LAB — CBC WITH DIFFERENTIAL/PLATELET
BASO%: 0.4 % (ref 0.0–2.0)
BASOS ABS: 0 10*3/uL (ref 0.0–0.1)
EOS ABS: 0 10*3/uL (ref 0.0–0.5)
EOS%: 1 % (ref 0.0–7.0)
HEMATOCRIT: 37.5 % — AB (ref 38.4–49.9)
HEMOGLOBIN: 12 g/dL — AB (ref 13.0–17.1)
LYMPH%: 28.6 % (ref 14.0–49.0)
MCH: 29.4 pg (ref 27.2–33.4)
MCHC: 32 g/dL (ref 32.0–36.0)
MCV: 91.7 fL (ref 79.3–98.0)
MONO#: 0.4 10*3/uL (ref 0.1–0.9)
MONO%: 7.6 % (ref 0.0–14.0)
NEUT%: 62.4 % (ref 39.0–75.0)
NEUTROS ABS: 3.1 10*3/uL (ref 1.5–6.5)
PLATELETS: 143 10*3/uL (ref 140–400)
RBC: 4.09 10*6/uL — ABNORMAL LOW (ref 4.20–5.82)
RDW: 15.8 % — ABNORMAL HIGH (ref 11.0–14.6)
WBC: 5 10*3/uL (ref 4.0–10.3)
lymph#: 1.4 10*3/uL (ref 0.9–3.3)

## 2015-06-07 LAB — COMPREHENSIVE METABOLIC PANEL (CC13)
ALBUMIN: 3.2 g/dL — AB (ref 3.5–5.0)
ALT: 46 U/L (ref 0–55)
AST: 31 U/L (ref 5–34)
Alkaline Phosphatase: 70 U/L (ref 40–150)
Anion Gap: 4 mEq/L (ref 3–11)
BUN: 45.5 mg/dL — AB (ref 7.0–26.0)
CALCIUM: 8.5 mg/dL (ref 8.4–10.4)
CHLORIDE: 115 meq/L — AB (ref 98–109)
CO2: 21 meq/L — AB (ref 22–29)
Creatinine: 1.3 mg/dL (ref 0.7–1.3)
EGFR: 56 mL/min/{1.73_m2} — AB (ref >90)
GLUCOSE: 90 mg/dL (ref 70–140)
POTASSIUM: 5 meq/L (ref 3.5–5.1)
SODIUM: 140 meq/L (ref 136–145)
Total Bilirubin: 0.38 mg/dL (ref 0.20–1.20)
Total Protein: 5.6 g/dL — ABNORMAL LOW (ref 6.4–8.3)

## 2015-06-07 LAB — MAGNESIUM (CC13): MAGNESIUM: 1.9 mg/dL (ref 1.5–2.5)

## 2015-06-07 MED ORDER — SODIUM CHLORIDE 0.9 % IJ SOLN
10.0000 mL | Freq: Once | INTRAMUSCULAR | Status: AC
  Administered 2015-06-07: 10 mL via INTRAVENOUS
  Filled 2015-06-07: qty 10

## 2015-06-07 MED ORDER — HEPARIN SOD (PORK) LOCK FLUSH 100 UNIT/ML IV SOLN
500.0000 [IU] | Freq: Once | INTRAVENOUS | Status: AC | PRN
  Administered 2015-06-07: 500 [IU]
  Filled 2015-06-07: qty 5

## 2015-06-07 MED ORDER — SODIUM CHLORIDE 0.9 % IV SOLN
1.0000 g | Freq: Once | INTRAVENOUS | Status: AC
  Administered 2015-06-07: 1 g via INTRAVENOUS
  Filled 2015-06-07: qty 2

## 2015-06-07 NOTE — Patient Instructions (Signed)
Hypomagnesemia Magnesium is a common ion (mineral) in the body which is needed for metabolism. It is about how the body handles food and other chemical reactions necessary for life. Only about 2% of the magnesium in our body is found in the blood. When this is low, it is called hypomagnesemia. The blood will measure only a tiny amount of the magnesium in our body. When it is low in our blood, it does not mean that the whole body supply is low. The normal serum concentration ranges from 1.8-2.5 mEq/L. When the level gets to be less than 1.0 mEq/L, a number of problems begin to happen.  CAUSES   Receiving intravenous fluids without magnesium replacement.  Loss of magnesium from the bowel by nasogastric suction.  Loss of magnesium from nausea and vomiting or severe diarrhea. Any of the inflammatory bowel conditions can cause this.  Abuse of alcohol often leads to low serum magnesium.  An inherited form of magnesium loss happens when the kidneys lose magnesium. This is called familial or primary hypomagnesemia.  Some medications such as diuretics also cause the loss of magnesium. SYMPTOMS  These following problems are worse if the changes in magnesium levels come on suddenly.  Tremor.  Confusion.  Muscle weakness.  Oversensitive to sights and sounds.  Sensitive reflexes.  Depression.  Muscular fibrillations.  Overreactivity of the nerves.  Irritability.  Psychosis.  Spasms of the hand muscles.  Tetany (where the muscles go into uncontrollable spasms). DIAGNOSIS  This condition can be diagnosed by blood tests. TREATMENT   In an emergency, magnesium can be given intravenously (by vein).  If the condition is less worrisome, it can be corrected by diet. High levels of magnesium are found in green leafy vegetables, peas, beans, and nuts among other things. It can also be given through medications by mouth.  If it is being caused by medications, changes can be made.  If  alcohol is a problem, help is available if there are difficulties giving it up. Document Released: 09/03/2005 Document Revised: 04/24/2014 Document Reviewed: 07/28/2008 [employer] Patient Information 2015 [employer]. This information is not intended to replace advice given to you by your health care provider. Make sure you discuss any questions you have with your health care provider.  

## 2015-06-10 ENCOUNTER — Other Ambulatory Visit: Payer: Self-pay | Admitting: Oncology

## 2015-06-13 ENCOUNTER — Other Ambulatory Visit: Payer: Self-pay | Admitting: Nurse Practitioner

## 2015-06-13 ENCOUNTER — Other Ambulatory Visit: Payer: Self-pay | Admitting: Oncology

## 2015-06-14 ENCOUNTER — Other Ambulatory Visit: Payer: Self-pay

## 2015-06-14 ENCOUNTER — Other Ambulatory Visit (HOSPITAL_BASED_OUTPATIENT_CLINIC_OR_DEPARTMENT_OTHER): Payer: BC Managed Care – PPO

## 2015-06-14 ENCOUNTER — Ambulatory Visit (HOSPITAL_BASED_OUTPATIENT_CLINIC_OR_DEPARTMENT_OTHER): Payer: BC Managed Care – PPO

## 2015-06-14 DIAGNOSIS — C9110 Chronic lymphocytic leukemia of B-cell type not having achieved remission: Secondary | ICD-10-CM

## 2015-06-14 DIAGNOSIS — T8609 Other complications of bone marrow transplant: Principal | ICD-10-CM

## 2015-06-14 DIAGNOSIS — D849 Immunodeficiency, unspecified: Secondary | ICD-10-CM

## 2015-06-14 DIAGNOSIS — D89811 Chronic graft-versus-host disease: Secondary | ICD-10-CM

## 2015-06-14 DIAGNOSIS — D899 Disorder involving the immune mechanism, unspecified: Secondary | ICD-10-CM

## 2015-06-14 LAB — CBC WITH DIFFERENTIAL/PLATELET
BASO%: 0 % (ref 0.0–2.0)
BASOS ABS: 0 10*3/uL (ref 0.0–0.1)
EOS ABS: 0 10*3/uL (ref 0.0–0.5)
EOS%: 0.5 % (ref 0.0–7.0)
HCT: 36.6 % — ABNORMAL LOW (ref 38.4–49.9)
HGB: 11.9 g/dL — ABNORMAL LOW (ref 13.0–17.1)
LYMPH%: 35.3 % (ref 14.0–49.0)
MCH: 29.7 pg (ref 27.2–33.4)
MCHC: 32.5 g/dL (ref 32.0–36.0)
MCV: 91.3 fL (ref 79.3–98.0)
MONO#: 0.5 10*3/uL (ref 0.1–0.9)
MONO%: 8.5 % (ref 0.0–14.0)
NEUT%: 55.7 % (ref 39.0–75.0)
NEUTROS ABS: 3.4 10*3/uL (ref 1.5–6.5)
Platelets: 137 10*3/uL — ABNORMAL LOW (ref 140–400)
RBC: 4.01 10*6/uL — AB (ref 4.20–5.82)
RDW: 15.2 % — AB (ref 11.0–14.6)
WBC: 6 10*3/uL (ref 4.0–10.3)
lymph#: 2.1 10*3/uL (ref 0.9–3.3)

## 2015-06-14 LAB — COMPREHENSIVE METABOLIC PANEL (CC13)
ALBUMIN: 3.4 g/dL — AB (ref 3.5–5.0)
ALK PHOS: 72 U/L (ref 40–150)
ALT: 46 U/L (ref 0–55)
ANION GAP: 5 meq/L (ref 3–11)
AST: 31 U/L (ref 5–34)
BUN: 60.1 mg/dL — ABNORMAL HIGH (ref 7.0–26.0)
CALCIUM: 8.3 mg/dL — AB (ref 8.4–10.4)
CO2: 18 mEq/L — ABNORMAL LOW (ref 22–29)
Chloride: 114 mEq/L — ABNORMAL HIGH (ref 98–109)
Creatinine: 1.6 mg/dL — ABNORMAL HIGH (ref 0.7–1.3)
EGFR: 44 mL/min/{1.73_m2} — AB (ref >90)
GLUCOSE: 160 mg/dL — AB (ref 70–140)
Potassium: 6.3 mEq/L (ref 3.5–5.1)
SODIUM: 136 meq/L (ref 136–145)
TOTAL PROTEIN: 5.7 g/dL — AB (ref 6.4–8.3)
Total Bilirubin: 0.26 mg/dL (ref 0.20–1.20)

## 2015-06-14 LAB — MAGNESIUM (CC13): Magnesium: 1.9 mg/dl (ref 1.5–2.5)

## 2015-06-14 MED ORDER — FLUCONAZOLE 100 MG PO TABS: 100.0000 mg | ORAL_TABLET | Freq: Once | ORAL | Status: DC

## 2015-06-14 MED ORDER — SODIUM CHLORIDE 0.9 % IJ SOLN
10.0000 mL | Freq: Once | INTRAMUSCULAR | Status: AC
  Administered 2015-06-14: 10 mL via INTRAVENOUS
  Filled 2015-06-14: qty 10

## 2015-06-14 MED ORDER — SODIUM CHLORIDE 0.9 % IV SOLN
1.0000 g | Freq: Once | INTRAVENOUS | Status: AC
  Administered 2015-06-14: 1 g via INTRAVENOUS
  Filled 2015-06-14: qty 2

## 2015-06-14 MED ORDER — SODIUM BICARBONATE 8.4 % IV SOLN
INTRAVENOUS | Status: DC
  Administered 2015-06-14: 11:00:00 via INTRAVENOUS
  Filled 2015-06-14: qty 1000

## 2015-06-14 MED ORDER — HEPARIN SOD (PORK) LOCK FLUSH 100 UNIT/ML IV SOLN
500.0000 [IU] | Freq: Once | INTRAVENOUS | Status: AC | PRN
  Administered 2015-06-14: 500 [IU]
  Filled 2015-06-14: qty 5

## 2015-06-14 NOTE — Progress Notes (Signed)
Panic potassium 6.33 rcvd.  Dr. Jana Hakim ordered D5 with 2 amps bicarb, 500 cc to be run over one hour.  If magnesium equivalent, ok to add.  If not, patient can go without magnesium.    Pharmacy notified.  Charge nurse notified.  Mag already running.  Can continue to run until D5 solution rcvd.  Patient does not have to finish magnesium.

## 2015-06-15 ENCOUNTER — Other Ambulatory Visit: Payer: Self-pay | Admitting: Oncology

## 2015-06-15 DIAGNOSIS — E2740 Unspecified adrenocortical insufficiency: Secondary | ICD-10-CM

## 2015-06-18 ENCOUNTER — Telehealth: Payer: Self-pay | Admitting: *Deleted

## 2015-06-18 ENCOUNTER — Other Ambulatory Visit: Payer: Self-pay

## 2015-06-18 NOTE — Telephone Encounter (Signed)
Per MD review of inquiry- contact is being made with pt's kidney MD per ongoing elevated potassium.  At present no further follow up until scheduled labs and IV treatment on 6/30.  This RN called pt and discussed the above.

## 2015-06-18 NOTE — Telephone Encounter (Signed)
-----   Message from Jarvis Morgan, RN sent at 06/18/2015  8:56 AM EDT ----- Regarding: Potassium level Contact: [phone] Hi Dr. Jana Hakim and/or Tivis Ringer, RN, Mr. Mentzer wanted to know if Dr. Jana Hakim would have any suggestions for pt to do since his potassium level was 6.3 on Thurs. 06/14/15.   Pt stated he received extra IVF on Thurs. Please advise pt. Thanks,  Thu.

## 2015-06-21 ENCOUNTER — Ambulatory Visit (HOSPITAL_BASED_OUTPATIENT_CLINIC_OR_DEPARTMENT_OTHER): Payer: BC Managed Care – PPO

## 2015-06-21 ENCOUNTER — Other Ambulatory Visit (HOSPITAL_BASED_OUTPATIENT_CLINIC_OR_DEPARTMENT_OTHER): Payer: BC Managed Care – PPO

## 2015-06-21 DIAGNOSIS — C9110 Chronic lymphocytic leukemia of B-cell type not having achieved remission: Secondary | ICD-10-CM

## 2015-06-21 LAB — MAGNESIUM (CC13): Magnesium: 2.1 mg/dl (ref 1.5–2.5)

## 2015-06-21 LAB — COMPREHENSIVE METABOLIC PANEL (CC13)
ALK PHOS: 64 U/L (ref 40–150)
ALT: 39 U/L (ref 0–55)
AST: 31 U/L (ref 5–34)
Albumin: 3.4 g/dL — ABNORMAL LOW (ref 3.5–5.0)
Anion Gap: 7 mEq/L (ref 3–11)
BILIRUBIN TOTAL: 0.31 mg/dL (ref 0.20–1.20)
BUN: 55.4 mg/dL — ABNORMAL HIGH (ref 7.0–26.0)
CO2: 17 mEq/L — ABNORMAL LOW (ref 22–29)
CREATININE: 1.6 mg/dL — AB (ref 0.7–1.3)
Calcium: 8.3 mg/dL — ABNORMAL LOW (ref 8.4–10.4)
Chloride: 115 mEq/L — ABNORMAL HIGH (ref 98–109)
EGFR: 45 mL/min/{1.73_m2} — ABNORMAL LOW (ref >90)
Glucose: 138 mg/dl (ref 70–140)
Potassium: 5.6 mEq/L — ABNORMAL HIGH (ref 3.5–5.1)
Sodium: 139 mEq/L (ref 136–145)
Total Protein: 5.5 g/dL — ABNORMAL LOW (ref 6.4–8.3)

## 2015-06-21 LAB — CBC WITH DIFFERENTIAL/PLATELET
BASO%: 0.4 % (ref 0.0–2.0)
BASOS ABS: 0 10*3/uL (ref 0.0–0.1)
EOS%: 0.4 % (ref 0.0–7.0)
Eosinophils Absolute: 0 10*3/uL (ref 0.0–0.5)
HEMATOCRIT: 36.7 % — AB (ref 38.4–49.9)
HEMOGLOBIN: 11.9 g/dL — AB (ref 13.0–17.1)
LYMPH#: 1.4 10*3/uL (ref 0.9–3.3)
LYMPH%: 24.2 % (ref 14.0–49.0)
MCH: 29.3 pg (ref 27.2–33.4)
MCHC: 32.4 g/dL (ref 32.0–36.0)
MCV: 90.3 fL (ref 79.3–98.0)
MONO#: 0.4 10*3/uL (ref 0.1–0.9)
MONO%: 7.1 % (ref 0.0–14.0)
NEUT%: 67.9 % (ref 39.0–75.0)
NEUTROS ABS: 4.1 10*3/uL (ref 1.5–6.5)
Platelets: 161 10*3/uL (ref 140–400)
RBC: 4.06 10*6/uL — ABNORMAL LOW (ref 4.20–5.82)
RDW: 15.9 % — AB (ref 11.0–14.6)
WBC: 6 10*3/uL (ref 4.0–10.3)

## 2015-06-21 MED ORDER — SODIUM CHLORIDE 0.9 % IV SOLN
1.0000 g | Freq: Once | INTRAVENOUS | Status: AC
  Administered 2015-06-21: 1 g via INTRAVENOUS
  Filled 2015-06-21: qty 2

## 2015-06-21 MED ORDER — HEPARIN SOD (PORK) LOCK FLUSH 100 UNIT/ML IV SOLN
500.0000 [IU] | Freq: Once | INTRAVENOUS | Status: AC | PRN
  Administered 2015-06-21: 500 [IU]
  Filled 2015-06-21: qty 5

## 2015-06-21 NOTE — Patient Instructions (Signed)
  Magnesium Sulfate injection What is this medicine? MAGNESIUM SULFATE (mag NEE zee um SUL fate) is an electrolyte injection commonly used to treat low magnesium levels in your blood and to prevent or control certain seizures. This medicine may be used for other purposes; ask your health care provider or pharmacist if you have questions. What should I tell my health care provider before I take this medicine? They need to know if you have any of these conditions: -heart disease -history of irregular heart beat -kidney disease -an unusual or allergic reaction to magnesium sulfate, medicines, foods, dyes, or preservatives -pregnant or trying to get pregnant -breast-feeding How should I use this medicine? This medicine is for infusion into a vein. It is given by a health care professional in a hospital or clinic setting. Talk to your pediatrician regarding the use of this medicine in children. While this drug may be prescribed for selected conditions, precautions do apply. Overdosage: If you think you have taken too much of this medicine contact a poison control center or emergency room at once. NOTE: This medicine is only for you. Do not share this medicine with others. What if I miss a dose? This does not apply. What may interact with this medicine? This medicine may interact with the following medications: -certain medicines for anxiety or sleep -certain medicines for seizures like phenobarbital -digoxin -medicines that relax muscles for surgery -narcotic medicines for pain This list may not describe all possible interactions. Give your health care provider a list of all the medicines, herbs, non-prescription drugs, or dietary supplements you use. Also tell them if you smoke, drink alcohol, or use illegal drugs. Some items may interact with your medicine. What should I watch for while using this medicine? Your condition will be monitored carefully while you are receiving this medicine. You  may need blood work done while you are receiving this medicine. What side effects may I notice from receiving this medicine? Side effects that you should report to your doctor or health care professional as soon as possible: -allergic reactions like skin rash, itching or hives, swelling of the face, lips, or tongue -facial flushing -muscle weakness -signs and symptoms of low blood pressure like dizziness; feeling faint or lightheaded, falls; unusually weak or tired -signs and symptoms of a dangerous change in heartbeat or heart rhythm like chest pain; dizziness; fast or irregular heartbeat; palpitations; breathing problems -sweating This list may not describe all possible side effects. Call your doctor for medical advice about side effects. You may report side effects to FDA at 1-800-FDA-1088. Where should I keep my medicine? This drug is given in a hospital or clinic and will not be stored at home. NOTE: This sheet is a summary. It may not cover all possible information. If you have questions about this medicine, talk to your doctor, pharmacist, or health care provider.  2015, Elsevier/Gold Standard. (2013-04-18 10:35:11)

## 2015-06-22 ENCOUNTER — Other Ambulatory Visit: Payer: Self-pay | Admitting: *Deleted

## 2015-06-26 ENCOUNTER — Telehealth: Payer: Self-pay | Admitting: Oncology

## 2015-06-26 NOTE — Telephone Encounter (Signed)
Confirmed lab appointment on 07/07. Will get new schedule at appointment.

## 2015-06-28 ENCOUNTER — Ambulatory Visit (HOSPITAL_BASED_OUTPATIENT_CLINIC_OR_DEPARTMENT_OTHER): Payer: BC Managed Care – PPO

## 2015-06-28 ENCOUNTER — Other Ambulatory Visit: Payer: Self-pay | Admitting: Oncology

## 2015-06-28 ENCOUNTER — Other Ambulatory Visit (HOSPITAL_BASED_OUTPATIENT_CLINIC_OR_DEPARTMENT_OTHER): Payer: BC Managed Care – PPO

## 2015-06-28 DIAGNOSIS — C9110 Chronic lymphocytic leukemia of B-cell type not having achieved remission: Secondary | ICD-10-CM

## 2015-06-28 LAB — CBC WITH DIFFERENTIAL/PLATELET
BASO%: 0.1 % (ref 0.0–2.0)
BASOS ABS: 0 10*3/uL (ref 0.0–0.1)
EOS ABS: 0 10*3/uL (ref 0.0–0.5)
EOS%: 0.3 % (ref 0.0–7.0)
HEMATOCRIT: 39.4 % (ref 38.4–49.9)
HEMOGLOBIN: 12.8 g/dL — AB (ref 13.0–17.1)
LYMPH%: 23.3 % (ref 14.0–49.0)
MCH: 29.7 pg (ref 27.2–33.4)
MCHC: 32.5 g/dL (ref 32.0–36.0)
MCV: 91.4 fL (ref 79.3–98.0)
MONO#: 0.5 10*3/uL (ref 0.1–0.9)
MONO%: 7.1 % (ref 0.0–14.0)
NEUT%: 69.2 % (ref 39.0–75.0)
NEUTROS ABS: 4.7 10*3/uL (ref 1.5–6.5)
Platelets: 132 10*3/uL — ABNORMAL LOW (ref 140–400)
RBC: 4.31 10*6/uL (ref 4.20–5.82)
RDW: 16 % — ABNORMAL HIGH (ref 11.0–14.6)
WBC: 6.8 10*3/uL (ref 4.0–10.3)
lymph#: 1.6 10*3/uL (ref 0.9–3.3)

## 2015-06-28 LAB — COMPREHENSIVE METABOLIC PANEL (CC13)
ALBUMIN: 3.7 g/dL (ref 3.5–5.0)
ALK PHOS: 84 U/L (ref 40–150)
ALT: 73 U/L — ABNORMAL HIGH (ref 0–55)
AST: 49 U/L — AB (ref 5–34)
Anion Gap: 8 mEq/L (ref 3–11)
BILIRUBIN TOTAL: 0.26 mg/dL (ref 0.20–1.20)
BUN: 56.1 mg/dL — AB (ref 7.0–26.0)
CO2: 16 mEq/L — ABNORMAL LOW (ref 22–29)
Calcium: 8.5 mg/dL (ref 8.4–10.4)
Chloride: 118 mEq/L — ABNORMAL HIGH (ref 98–109)
Creatinine: 1.5 mg/dL — ABNORMAL HIGH (ref 0.7–1.3)
EGFR: 45 mL/min/{1.73_m2} — ABNORMAL LOW (ref >90)
GLUCOSE: 95 mg/dL (ref 70–140)
Potassium: 5 mEq/L (ref 3.5–5.1)
SODIUM: 143 meq/L (ref 136–145)
TOTAL PROTEIN: 6 g/dL — AB (ref 6.4–8.3)

## 2015-06-28 LAB — MAGNESIUM (CC13): MAGNESIUM: 2.3 mg/dL (ref 1.5–2.5)

## 2015-06-28 MED ORDER — HEPARIN SOD (PORK) LOCK FLUSH 100 UNIT/ML IV SOLN
500.0000 [IU] | Freq: Once | INTRAVENOUS | Status: AC | PRN
  Administered 2015-06-28: 500 [IU]
  Filled 2015-06-28: qty 5

## 2015-06-28 MED ORDER — SODIUM CHLORIDE 0.9 % IJ SOLN
10.0000 mL | Freq: Once | INTRAMUSCULAR | Status: AC
  Administered 2015-06-28: 10 mL
  Filled 2015-06-28: qty 10

## 2015-06-28 MED ORDER — SODIUM CHLORIDE 0.9 % IV SOLN
1.0000 g | Freq: Once | INTRAVENOUS | Status: AC
  Administered 2015-06-28: 1 g via INTRAVENOUS
  Filled 2015-06-28: qty 2

## 2015-06-28 NOTE — Patient Instructions (Signed)

## 2015-07-05 ENCOUNTER — Ambulatory Visit (HOSPITAL_BASED_OUTPATIENT_CLINIC_OR_DEPARTMENT_OTHER): Payer: BC Managed Care – PPO

## 2015-07-05 ENCOUNTER — Ambulatory Visit (HOSPITAL_BASED_OUTPATIENT_CLINIC_OR_DEPARTMENT_OTHER): Payer: BC Managed Care – PPO | Admitting: Nurse Practitioner

## 2015-07-05 ENCOUNTER — Other Ambulatory Visit (HOSPITAL_BASED_OUTPATIENT_CLINIC_OR_DEPARTMENT_OTHER): Payer: BC Managed Care – PPO

## 2015-07-05 ENCOUNTER — Encounter: Payer: Self-pay | Admitting: Nurse Practitioner

## 2015-07-05 DIAGNOSIS — T8609 Other complications of bone marrow transplant: Secondary | ICD-10-CM

## 2015-07-05 DIAGNOSIS — Z95828 Presence of other vascular implants and grafts: Secondary | ICD-10-CM

## 2015-07-05 DIAGNOSIS — C9110 Chronic lymphocytic leukemia of B-cell type not having achieved remission: Secondary | ICD-10-CM | POA: Diagnosis not present

## 2015-07-05 DIAGNOSIS — D89811 Chronic graft-versus-host disease: Secondary | ICD-10-CM

## 2015-07-05 LAB — COMPREHENSIVE METABOLIC PANEL (CC13)
ALBUMIN: 3.4 g/dL — AB (ref 3.5–5.0)
ALK PHOS: 69 U/L (ref 40–150)
ALT: 45 U/L (ref 0–55)
AST: 31 U/L (ref 5–34)
Anion Gap: 5 mEq/L (ref 3–11)
BUN: 53.4 mg/dL — ABNORMAL HIGH (ref 7.0–26.0)
CHLORIDE: 118 meq/L — AB (ref 98–109)
CO2: 16 meq/L — AB (ref 22–29)
CREATININE: 1.5 mg/dL — AB (ref 0.7–1.3)
Calcium: 8.7 mg/dL (ref 8.4–10.4)
EGFR: 48 mL/min/{1.73_m2} — AB (ref >90)
Glucose: 162 mg/dl — ABNORMAL HIGH (ref 70–140)
POTASSIUM: 5.5 meq/L — AB (ref 3.5–5.1)
SODIUM: 138 meq/L (ref 136–145)
Total Bilirubin: 0.29 mg/dL (ref 0.20–1.20)
Total Protein: 5.6 g/dL — ABNORMAL LOW (ref 6.4–8.3)

## 2015-07-05 LAB — CBC WITH DIFFERENTIAL/PLATELET
BASO%: 0.5 % (ref 0.0–2.0)
BASOS ABS: 0 10*3/uL (ref 0.0–0.1)
EOS%: 0.5 % (ref 0.0–7.0)
Eosinophils Absolute: 0 10*3/uL (ref 0.0–0.5)
HEMATOCRIT: 37.3 % — AB (ref 38.4–49.9)
HEMOGLOBIN: 12 g/dL — AB (ref 13.0–17.1)
LYMPH#: 1.7 10*3/uL (ref 0.9–3.3)
LYMPH%: 26.8 % (ref 14.0–49.0)
MCH: 29 pg (ref 27.2–33.4)
MCHC: 32.2 g/dL (ref 32.0–36.0)
MCV: 90.2 fL (ref 79.3–98.0)
MONO#: 0.4 10*3/uL (ref 0.1–0.9)
MONO%: 6 % (ref 0.0–14.0)
NEUT#: 4.3 10*3/uL (ref 1.5–6.5)
NEUT%: 66.2 % (ref 39.0–75.0)
Platelets: 157 10*3/uL (ref 140–400)
RBC: 4.13 10*6/uL — ABNORMAL LOW (ref 4.20–5.82)
RDW: 16.3 % — ABNORMAL HIGH (ref 11.0–14.6)
WBC: 6.5 10*3/uL (ref 4.0–10.3)

## 2015-07-05 LAB — MAGNESIUM (CC13): Magnesium: 1.9 mg/dl (ref 1.5–2.5)

## 2015-07-05 MED ORDER — HEPARIN SOD (PORK) LOCK FLUSH 100 UNIT/ML IV SOLN
500.0000 [IU] | Freq: Once | INTRAVENOUS | Status: AC
  Administered 2015-07-05: 500 [IU] via INTRAVENOUS
  Filled 2015-07-05: qty 5

## 2015-07-05 MED ORDER — HEPARIN SOD (PORK) LOCK FLUSH 100 UNIT/ML IV SOLN
500.0000 [IU] | Freq: Once | INTRAVENOUS | Status: DC | PRN
  Filled 2015-07-05: qty 5

## 2015-07-05 MED ORDER — SODIUM CHLORIDE 0.9 % IJ SOLN
10.0000 mL | INTRAMUSCULAR | Status: DC | PRN
  Administered 2015-07-05 (×2): 10 mL via INTRAVENOUS
  Filled 2015-07-05: qty 10

## 2015-07-05 MED ORDER — SODIUM CHLORIDE 0.9 % IV SOLN
Freq: Once | INTRAVENOUS | Status: AC
  Administered 2015-07-05: 10:00:00 via INTRAVENOUS
  Filled 2015-07-05: qty 500

## 2015-07-05 NOTE — Progress Notes (Signed)
ID: [name]   DOB: 1946-01-07  MR#: [id number]  FVO#:[id number]  KBT:CYEL, [name], MD SU: OTHER MD: [name]; [name], MD; [name], [name],MD; [name], MD; [facility] [place] [facility] [place], [place]:  [name], RN [phone]), [name] M.D.  CHIEF COMPLAINT:  CLL, status post allogeneic stem cell transplant, GVHD CURRENT TREATMENT: Immunosuppression, supportive care  HISTORY OF CLL: From the original intake note:  We have very complete records from Dr. Racheal Patches in [place], and in summary:  The patient was initially diagnosed in August 2000, with a white cell count of 23,600, but normal hemoglobin and platelets, and no significant symptomatology. Over the next several years his white cell count drifted up, and he eventually developed some symptoms of night sweats in particular, leading to treatment with fludarabine, Cytoxan and rituxan for five cycles given between December 2006 and May 2007.  We have CT scans from June 2006, November 2006 and April 2007, and comparing the November 2006 and April 2007 scans, there was near complete response. He had subsequent therapy in [place] as detailed below, but with decreased response, leading to allogeneic stem-cell transplant at the [facility] 02/24/2012.  Subsequent history is as detailed below.  INTERVAL HISTORY: Elieser returns today for follow up of his chronic lymphoid leukemia and graft-versus-host disease accompanied his wife, [name]. He was seen in the infusion room because of scheduling conflicts. He is due for IV fluids and magnesium today. The interval history is generally unremarkable.    REVIEW OF SYSTEMS: Pastor continues on 7.$RemoveBefore'5mg'nQMXpnHBxPdAP$  prednisone daily, and has had relatively good control of his bowels so far. No accidents. He would like to be weaned down again if Dr. Jana Hakim were agreeable. He continues to wake up regularly to urinate so this is not  new. His bilateral lower extremity edema is somewhat better, but he has had to make several trips to the supply store to find compressions stockings that are not too tight and not to loose. He denies fevers, chills, unexplained weight loss, or excessive fatigue. He feels somewhat stronger now that he is walking more. His blood sugars are well controlled. A detailed review of systems is otherwise stable.  PAST MEDICAL HISTORY: Past Medical History  Diagnosis Date  . 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 Chronic graft-versus-host disease   . Diverticular disease   . Hyperlipidemia   . Obesity   . Hypertension   . Hiatal hernia   . CMV (cytomegalovirus) antibody positive     pre-transplant, with seroconversion x2 pst-transplant  . Right bundle branch block     pre-transplant  . CKD (chronic kidney disease) stage 2, GFR 60-89 ml/min   . Pancytopenia   . Atrial fibrillation     post-transplant  . Myopathy   . 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 Chronic graft-versus-host disease   . 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 Diarrhea in adult patient 12/05/2012    Due to active GVHD  . Rash of face 12/05/2012    Due to GVHD  . Hypomagnesemia 01/26/2013  . Left hip pain 12/01/2013  . Steroid-induced diabetes     novalog  . Leukemia, chronic lymphoid   . CLL (chronic lymphocytic leukemia) 12/05/2012    Dx 07/1999; started Rx 12/06  AlloBMT 3/13    PAST SURGICAL HISTORY: Past Surgical History  Procedure Laterality Date  . Tonsillectomy and adenoidectomy    . Bone marrow transplant    .  Flexible sigmoidoscopy  11/17/2012    Procedure: FLEXIBLE SIGMOIDOSCOPY;  Surgeon: Jeryl Columbia, MD;  Location: [facility];  Service: Endoscopy;  Laterality: N/A;  Dr Watt Climes states will be admitted to rooom 1339 11/16/12  . Esophagogastroduodenoscopy  11/17/2012    Procedure: ESOPHAGOGASTRODUODENOSCOPY (EGD);  Surgeon: Jeryl Columbia, MD;  Location: [facility];   Service: Endoscopy;  Laterality: N/A;    FAMILY HISTORY Family History  Problem Relation Age of Onset  . Cancer Father   The patient's father died from complications of chronic lymphocytic leukemia at the age of 50.  It had been diagnosed seven years before when he was 67.  The patient is enrolled in a familial chronic lymphocytic leukemia study out of the [employer].  The patient's mother is 106, alive, unfortunately suffering with dementia, and he has a brother, 36, who is otherwise in fair health.   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  His wife of >40 years, [name], is a homemaker.  Their daughter, [name], lives in [place].  She also is a Agricultural engineer.  The patient has an 70 year old grandson and an 29-year-old granddaughter, and that is really the main reason he moved to this area.  He is a Tourist information centre manager.     ADVANCED DIRECTIVES: In place  HEALTH MAINTENANCE: (Updated 04/13/2014) History  Substance Use Topics  . Smoking status: Never Smoker   . Smokeless tobacco: Never Used  . Alcohol Use: No     Colonoscopy: Nov 2013, Dr. Watt Climes  PSA: Not on file  Bone density:  Feb 2014;  Patient also has known insufficiency and pathologic fractures in addition to his long-standing history of steroid use.  Lipid panel: Jan 2015, elevated    Allergies  Allergen Reactions  . Benadryl [Diphenhydramine Hcl]     "Restless leg syndrome"    Current Outpatient Prescriptions  Medication Sig Dispense Refill  . acyclovir (ZOVIRAX) 400 MG tablet TAKE 2 TABLETS BY MOUTH TWICE DAILY 120 tablet 1  . budesonide (ENTOCORT EC) 3 MG 24 hr capsule TAKE 1 CAPSULE BY MOUTH 3 TIMES DAILY 90 capsule 6  . cholestyramine (QUESTRAN) 4 G packet Take 1 packet (4 g total) by mouth 2  (two) times daily. 60 each 12  . diltiazem (CARDIZEM CD) 240 MG 24 hr capsule TAKE 1 CAPSULE BY MOUTH ONCE DAILY 30 capsule 3  . fluconazole (DIFLUCAN) 100 MG tablet Take 1 tablet (100 mg total) by mouth once. 30 tablet 3  . furosemide (LASIX) 20 MG tablet Take one tablet daily; may repeat x1 PRN swelling (Patient taking differently: Take 20 mg by mouth daily. Take one tablet daily) 60 tablet 1  . insulin aspart (NOVOLOG) 100 unit/mL injection Inject 12-20 Units into the skin. Sliding scale    . Insulin Detemir (LEVEMIR) 100 UNIT/ML Pen Inject 50 Units into the skin daily.    . Insulin Pen Needle (B-D UF III MINI PEN NEEDLES) 31G X 5 MM MISC Use five daily with insulin as directed. 100 each 5  . labetalol (NORMODYNE) 200 MG tablet Take 2 tablets (400 mg total) by mouth 2 (two) times daily. 120 tablet 1  . Lidocaine-Hydrocortisone Ace 3-0.5 % KIT Apply 1 application topically as needed (for pain). 1 each 3  . lisinopril (PRINIVIL,ZESTRIL) 10 MG tablet Take 10 mg  by mouth daily.     Marland Kitchen loratadine (CLARITIN) 10 MG tablet Take 10 mg by mouth daily.    Marland Kitchen omeprazole (PRILOSEC) 20 MG capsule Take 1 capsule (20 mg total) by mouth daily. 30 capsule 5  . ONE TOUCH ULTRA TEST test strip TEST BEFORE MEALS AND AT BEDTIME. 300 each 2  . ONETOUCH DELICA LANCETS 08X MISC USE TO TEST BEFORE MEALS AND AT BEDTIME 300 each 0  . predniSONE (DELTASONE) 10 MG tablet Take 1 tablet (10 mg total) by mouth daily with breakfast. (Patient taking differently: Take 7.5 mg by mouth daily with breakfast. ) 90 tablet 12  . sertraline (ZOLOFT) 50 MG tablet Take 1 tablet (50 mg total) by mouth every morning. 90 tablet 5  . sulfamethoxazole-trimethoprim (BACTRIM DS,SEPTRA DS) 800-160 MG per tablet Take 1 tablet by mouth daily. 30 tablet 5  . sulfamethoxazole-trimethoprim (BACTRIM DS,SEPTRA DS) 800-160 MG per tablet TAKE 1 TABLET BY MOUTH ONCE DAILY 30 tablet 5  . tacrolimus (PROGRAF) 0.5 MG capsule Take 3 capsules (1.5 mg total) by  mouth 2 (two) times daily. 180 capsule 4  . TRESIBA FLEXTOUCH 200 UNIT/ML SOPN     . VOLTAREN 1 % GEL Apply 2 g topically 2 (two) times daily. Applied to back    . [DISCONTINUED] insulin aspart (NOVOLOG FLEXPEN) 100 UNIT/ML SOPN FlexPen 18units sq qam, 9units sq qpm, or as directed 15 mL 1   No current facility-administered medications for this visit.   Facility-Administered Medications Ordered in Other Visits  Medication Dose Route Frequency Provider Last Rate Last Dose  . 0.9 %  sodium chloride infusion   Intravenous Continuous Amy G Berry, PA-C 500 mL/hr at 03/12/13 0900    . heparin lock flush 100 unit/mL  500 Units Intracatheter Once PRN Chauncey Cruel, MD      . heparin lock flush 100 unit/mL  500 Units Intravenous Once Laurie Panda, NP      . sodium chloride 0.9 % injection 10 mL  10 mL Intravenous PRN Chauncey Cruel, MD   10 mL at 08/11/12 1606  . sodium chloride 0.9 % injection 10 mL  10 mL Intravenous PRN Laurie Panda, NP   10 mL at 07/05/15 0940    OBJECTIVE: Middle-aged white man in no acute distress  There were no vitals filed for this visit.There is no weight on file to calculate BMI.  ECOG: 2 There were no vitals filed for this visit.  Skin: warm, dry, chronic purpura lesions HEENT: sclerae anicteric, conjunctivae pink, oropharynx clear. No thrush or mucositis.  Lymph Nodes: No cervical or supraclavicular lymphadenopathy  Lungs: clear to auscultation bilaterally, no rales, wheezes, or rhonci  Heart: regular rate and rhythm  Abdomen: round, soft, non tender, positive bowel sounds  Musculoskeletal: No focal spinal tenderness, +2 bilateral lower extremity edema Neuro: non focal, well oriented, positive affect   LABS:  CBC    Component Value Date/Time   WBC 6.5 07/05/2015 0850   WBC 3.2* 02/27/2015 0805   RBC 4.13* 07/05/2015 0850   RBC 3.64* 02/27/2015 0805   RBC 3.82* 03/16/2013 1400   HGB 12.0* 07/05/2015 0850   HGB 11.3* 02/27/2015 0805   HCT  37.3* 07/05/2015 0850   HCT 34.1* 02/27/2015 0805   PLT 157 07/05/2015 0850   PLT 120* 02/27/2015 0805   MCV 90.2 07/05/2015 0850   MCV 93.7 02/27/2015 0805   MCH 29.0 07/05/2015 0850   MCH 31.0 02/27/2015 0805   MCHC 32.2 07/05/2015 0850  MCHC 33.1 02/27/2015 0805   RDW 16.3* 07/05/2015 0850   RDW 16.2* 02/27/2015 0805   LYMPHSABS 1.7 07/05/2015 0850   LYMPHSABS 1.4 03/18/2013 0615   MONOABS 0.4 07/05/2015 0850   MONOABS 0.3 03/18/2013 0615   EOSABS 0.0 07/05/2015 0850   EOSABS 0.0 03/18/2013 0615   BASOSABS 0.0 07/05/2015 0850   BASOSABS 0.0 03/18/2013 0615       Chemistry      Component Value Date/Time   NA 138 07/05/2015 0850   NA 139 04/05/2015 0911   K 5.5* 07/05/2015 0850   K 4.6 04/05/2015 0911   CL 110 04/05/2015 0911   CL 101 06/15/2013 1034   CO2 16* 07/05/2015 0850   CO2 22 04/05/2015 0911   BUN 53.4* 07/05/2015 0850   BUN 45* 04/05/2015 0911   CREATININE 1.5* 07/05/2015 0850   CREATININE 1.23 04/05/2015 0911      Component Value Date/Time   CALCIUM 8.7 07/05/2015 0850   CALCIUM 8.4 04/05/2015 0911   ALKPHOS 69 07/05/2015 0850   ALKPHOS 108 04/05/2015 0911   AST 31 07/05/2015 0850   AST 50* 04/05/2015 0911   ALT 45 07/05/2015 0850   ALT 59* 04/05/2015 0911   BILITOT 0.29 07/05/2015 0850   BILITOT 0.5 04/05/2015 0911     STUDIES: No results found.   ASSESSMENT: 69 y.o. [place] man with a history of well-differentiated lymphocytic lymphoma/ chronic lymphoid leukemia initially diagnosed in 2000, not requiring intervention until 2006; with multiple chromosomal abnormalities.  His treatment history is as follows:  (1) fludarabine/cyclophosphamide/rituximab x5 completed May 2007.   (2) rituximab for 8 doses October 2010, with partial response   (3) Leustatin and ofatumumab weekly x8 July to September 2011 followed by maintenance ofatumumab  every 2 months, with initial response but rising counts September 2012   (4) status-post unrelated donor  stem-cell transplant 02/24/2012 at the [facility]  (a) conditioning regimen consisted of fludarabine + TBI at 200 cGy, followed by rituximab x27;  (b) CMV reactivation x3 (patient CMV positive, donor negative), s/p ganciclovir treatment; 3d reactivation August 2013, s/p gancyclovir, with negative PCR mid-September 2013; last gancyclovir dose 10/06/2012 (c) Chronic GVHD: involving gut and skin, treated with steroids, tacrolimus and MMF.  MMF was eventually d/c'd and tacrolimus currently at a dose of 1.$RemoveB'5mg'evGHlEkp$  BID (d) atrial fibrillation: resolved on brief amiodarone regimen (e) steroid-induced myopathy: improving  (f) hypomagnesemia: improved after d/c gancyclovir, needs continuing support (g) hypogammaglobulinemia: requiring IVIG most recently 08/03/2014. (h) history of elevated triglycerides (606 on 07/14/2012)  (i) adrenal insufficiency: on prednisone and budesonide (j) pancytopenia,resolved (k) brief episode of neutropenia ([place] 300) February 2015, accompanied by diarrhea; resolved   (5) restaging studies February-March 2015 including CT scans, flow cytometry, and bone marrow biopsy, showed no evidence of residual chronic lymphoid leukemia.  (a) repeat bone marrow biopsy 02/27/2015 showed no evidence of chronic lymphoid leukemia and also no dyspoiesis. Flow cytometry showed no B cells.  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 Status post kyphoplasty at L3-4 in June 2014.  Also with evidence of rib fractures and  insufficiency fractures bilaterally of the sacral  alae, noted by CT in March 2015. --   Denosumab started 12/08/2013, given as prolia Q6 months which is what has been approved by his insurance, most recent dose 03/29/2015  (12) chronic back pain and hip pain controlled with OxyContin and hydrocodone/APAP.  (12) nausea: well controlled on current meds  (13)  Positive c.diff, 03/08/2013, on Flagyl 500 mg TID x 20 days, then on oral vanco with Questran, showing improvement; positive when repeated April 2014; Negative x 3 since then  (14) persistently increased BUN and potassium  (15)  Hypertension, on labetalol, cardizem, lisinopril, and furosemide; managed by Dr. Brigitte Pulse  (16) steroid induced hyperglycemia/ DM II: managed by Dr Brigitte Pulse   (17) hypogammaglobulinemia-- requiring intermittent supplementation, most recent dose 05/10/2015  (18) squamous cell CA in situ removed from left parietal scalp October 2014  PLAN: Because Monnie's bowel movements seem to be under control, we will trial a drop in his predinsone. He will alternating days between the 7.$RemoveBefor'5mg'QxAsSCVPZnea$  and $Re'5mg'yvj$  dose for the next 2 weeks. Obviously, if he fails this trial he will return to 7.$RemoveBef'5mg'rwadZoFzWY$  daily like he has been doing.   Taten will continue to work on finding compression stockings that fit, but he is understandably frustrated at this point.   He will continue weekly fluids and magnesium infusions. He will return in 4 weeks for a follow up visit. He understands and agrees with this plan. He has been encouraged to call with any issues that might arise before his next visit here.   Laurie Panda, NP 07/05/2015

## 2015-07-05 NOTE — Patient Instructions (Signed)
Hypomagnesemia Magnesium is a common ion (mineral) in the body which is needed for metabolism. It is about how the body handles food and other chemical reactions necessary for life. Only about 2% of the magnesium in our body is found in the blood. When this is low, it is called hypomagnesemia. The blood will measure only a tiny amount of the magnesium in our body. When it is low in our blood, it does not mean that the whole body supply is low. The normal serum concentration ranges from 1.8-2.5 mEq/L. When the level gets to be less than 1.0 mEq/L, a number of problems begin to happen.  CAUSES   Receiving intravenous fluids without magnesium replacement.  Loss of magnesium from the bowel by nasogastric suction.  Loss of magnesium from nausea and vomiting or severe diarrhea. Any of the inflammatory bowel conditions can cause this.  Abuse of alcohol often leads to low serum magnesium.  An inherited form of magnesium loss happens when the kidneys lose magnesium. This is called familial or primary hypomagnesemia.  Some medications such as diuretics also cause the loss of magnesium. SYMPTOMS  These following problems are worse if the changes in magnesium levels come on suddenly.  Tremor.  Confusion.  Muscle weakness.  Oversensitive to sights and sounds.  Sensitive reflexes.  Depression.  Muscular fibrillations.  Overreactivity of the nerves.  Irritability.  Psychosis.  Spasms of the hand muscles.  Tetany (where the muscles go into uncontrollable spasms). DIAGNOSIS  This condition can be diagnosed by blood tests. TREATMENT   In an emergency, magnesium can be given intravenously (by vein).  If the condition is less worrisome, it can be corrected by diet. High levels of magnesium are found in green leafy vegetables, peas, beans, and nuts among other things. It can also be given through medications by mouth.  If it is being caused by medications, changes can be made.  If  alcohol is a problem, help is available if there are difficulties giving it up. Document Released: 09/03/2005 Document Revised: 04/24/2014 Document Reviewed: 07/28/2008 [employer] Patient Information 2015 [employer]. This information is not intended to replace advice given to you by your health care provider. Make sure you discuss any questions you have with your health care provider.  

## 2015-07-12 ENCOUNTER — Other Ambulatory Visit: Payer: Self-pay | Admitting: Nurse Practitioner

## 2015-07-12 ENCOUNTER — Ambulatory Visit (HOSPITAL_BASED_OUTPATIENT_CLINIC_OR_DEPARTMENT_OTHER): Payer: BC Managed Care – PPO

## 2015-07-12 ENCOUNTER — Other Ambulatory Visit (HOSPITAL_BASED_OUTPATIENT_CLINIC_OR_DEPARTMENT_OTHER): Payer: BC Managed Care – PPO

## 2015-07-12 DIAGNOSIS — C9110 Chronic lymphocytic leukemia of B-cell type not having achieved remission: Secondary | ICD-10-CM

## 2015-07-12 LAB — CBC WITH DIFFERENTIAL/PLATELET
BASO%: 0 % (ref 0.0–2.0)
Basophils Absolute: 0 10*3/uL (ref 0.0–0.1)
EOS%: 0.5 % (ref 0.0–7.0)
Eosinophils Absolute: 0 10*3/uL (ref 0.0–0.5)
HCT: 37.3 % — ABNORMAL LOW (ref 38.4–49.9)
HGB: 12.1 g/dL — ABNORMAL LOW (ref 13.0–17.1)
LYMPH%: 27.1 % (ref 14.0–49.0)
MCH: 29.4 pg (ref 27.2–33.4)
MCHC: 32.4 g/dL (ref 32.0–36.0)
MCV: 90.5 fL (ref 79.3–98.0)
MONO#: 0.4 10*3/uL (ref 0.1–0.9)
MONO%: 5.9 % (ref 0.0–14.0)
NEUT%: 66.5 % (ref 39.0–75.0)
NEUTROS ABS: 4.2 10*3/uL (ref 1.5–6.5)
PLATELETS: 132 10*3/uL — AB (ref 140–400)
RBC: 4.12 10*6/uL — ABNORMAL LOW (ref 4.20–5.82)
RDW: 16.3 % — AB (ref 11.0–14.6)
WBC: 6.2 10*3/uL (ref 4.0–10.3)
lymph#: 1.7 10*3/uL (ref 0.9–3.3)

## 2015-07-12 LAB — COMPREHENSIVE METABOLIC PANEL (CC13)
ALT: 35 U/L (ref 0–55)
AST: 33 U/L (ref 5–34)
Albumin: 3.5 g/dL (ref 3.5–5.0)
Alkaline Phosphatase: 76 U/L (ref 40–150)
Anion Gap: 6 meq/L (ref 3–11)
BUN: 50.2 mg/dL — ABNORMAL HIGH (ref 7.0–26.0)
CO2: 17 meq/L — ABNORMAL LOW (ref 22–29)
Calcium: 8.8 mg/dL (ref 8.4–10.4)
Chloride: 120 meq/L — ABNORMAL HIGH (ref 98–109)
Creatinine: 1.4 mg/dL — ABNORMAL HIGH (ref 0.7–1.3)
EGFR: 50 mL/min/{1.73_m2} — ABNORMAL LOW
Glucose: 127 mg/dL (ref 70–140)
Potassium: 5.3 meq/L — ABNORMAL HIGH (ref 3.5–5.1)
Sodium: 143 meq/L (ref 136–145)
Total Bilirubin: 0.28 mg/dL (ref 0.20–1.20)
Total Protein: 5.7 g/dL — ABNORMAL LOW (ref 6.4–8.3)

## 2015-07-12 LAB — MAGNESIUM (CC13): Magnesium: 2 mg/dl (ref 1.5–2.5)

## 2015-07-12 MED ORDER — SODIUM CHLORIDE 0.9 % IJ SOLN
10.0000 mL | INTRAMUSCULAR | Status: AC | PRN
  Administered 2015-07-12: 10 mL
  Filled 2015-07-12: qty 10

## 2015-07-12 MED ORDER — SODIUM CHLORIDE 0.9 % IV SOLN
Freq: Once | INTRAVENOUS | Status: AC
  Administered 2015-07-12: 10:00:00 via INTRAVENOUS
  Filled 2015-07-12: qty 500

## 2015-07-12 MED ORDER — HEPARIN SOD (PORK) LOCK FLUSH 100 UNIT/ML IV SOLN
500.0000 [IU] | Freq: Once | INTRAVENOUS | Status: AC | PRN
  Administered 2015-07-12: 500 [IU]
  Filled 2015-07-12: qty 5

## 2015-07-12 MED ORDER — HEPARIN SOD (PORK) LOCK FLUSH 100 UNIT/ML IV SOLN
250.0000 [IU] | Freq: Once | INTRAVENOUS | Status: DC | PRN
  Filled 2015-07-12: qty 5

## 2015-07-12 MED ORDER — SODIUM CHLORIDE 0.9 % IV SOLN
Freq: Once | INTRAVENOUS | Status: AC
  Administered 2015-07-12: 09:00:00 via INTRAVENOUS

## 2015-07-12 NOTE — Patient Instructions (Signed)
  Magnesium Sulfate injection What is this medicine? MAGNESIUM SULFATE (mag NEE zee um SUL fate) is an electrolyte injection commonly used to treat low magnesium levels in your blood and to prevent or control certain seizures. This medicine may be used for other purposes; ask your health care provider or pharmacist if you have questions. What should I tell my health care provider before I take this medicine? They need to know if you have any of these conditions: -heart disease -history of irregular heart beat -kidney disease -an unusual or allergic reaction to magnesium sulfate, medicines, foods, dyes, or preservatives -pregnant or trying to get pregnant -breast-feeding How should I use this medicine? This medicine is for infusion into a vein. It is given by a health care professional in a hospital or clinic setting. Talk to your pediatrician regarding the use of this medicine in children. While this drug may be prescribed for selected conditions, precautions do apply. Overdosage: If you think you have taken too much of this medicine contact a poison control center or emergency room at once. NOTE: This medicine is only for you. Do not share this medicine with others. What if I miss a dose? This does not apply. What may interact with this medicine? This medicine may interact with the following medications: -certain medicines for anxiety or sleep -certain medicines for seizures like phenobarbital -digoxin -medicines that relax muscles for surgery -narcotic medicines for pain This list may not describe all possible interactions. Give your health care provider a list of all the medicines, herbs, non-prescription drugs, or dietary supplements you use. Also tell them if you smoke, drink alcohol, or use illegal drugs. Some items may interact with your medicine. What should I watch for while using this medicine? Your condition will be monitored carefully while you are receiving this medicine. You  may need blood work done while you are receiving this medicine. What side effects may I notice from receiving this medicine? Side effects that you should report to your doctor or health care professional as soon as possible: -allergic reactions like skin rash, itching or hives, swelling of the face, lips, or tongue -facial flushing -muscle weakness -signs and symptoms of low blood pressure like dizziness; feeling faint or lightheaded, falls; unusually weak or tired -signs and symptoms of a dangerous change in heartbeat or heart rhythm like chest pain; dizziness; fast or irregular heartbeat; palpitations; breathing problems -sweating This list may not describe all possible side effects. Call your doctor for medical advice about side effects. You may report side effects to FDA at 1-800-FDA-1088. Where should I keep my medicine? This drug is given in a hospital or clinic and will not be stored at home. NOTE: This sheet is a summary. It may not cover all possible information. If you have questions about this medicine, talk to your doctor, pharmacist, or health care provider.  2015, Elsevier/Gold Standard. (2013-04-18 10:35:11)

## 2015-07-13 ENCOUNTER — Other Ambulatory Visit: Payer: Self-pay | Admitting: *Deleted

## 2015-07-13 DIAGNOSIS — K449 Diaphragmatic hernia without obstruction or gangrene: Secondary | ICD-10-CM

## 2015-07-13 DIAGNOSIS — T8609 Other complications of bone marrow transplant: Secondary | ICD-10-CM

## 2015-07-13 DIAGNOSIS — D89811 Chronic graft-versus-host disease: Secondary | ICD-10-CM

## 2015-07-13 DIAGNOSIS — E2740 Unspecified adrenocortical insufficiency: Secondary | ICD-10-CM

## 2015-07-13 LAB — IGG, IGA, IGM
IgA: <6 mg/dL — ABNORMAL LOW (ref 68–379)
IgG (Immunoglobin G), Serum: 348 mg/dL — ABNORMAL LOW (ref 650–1600)
IgM, Serum: <5 mg/dL — ABNORMAL LOW (ref 41–251)

## 2015-07-13 MED ORDER — ACYCLOVIR 400 MG PO TABS: 800.0000 mg | ORAL_TABLET | Freq: Two times a day (BID) | ORAL | Status: DC

## 2015-07-13 MED ORDER — OMEPRAZOLE 20 MG PO CPDR: 20.0000 mg | DELAYED_RELEASE_CAPSULE | Freq: Every day | ORAL | Status: DC

## 2015-07-17 ENCOUNTER — Other Ambulatory Visit: Payer: Self-pay | Admitting: Nurse Practitioner

## 2015-07-19 ENCOUNTER — Ambulatory Visit (HOSPITAL_BASED_OUTPATIENT_CLINIC_OR_DEPARTMENT_OTHER): Payer: BC Managed Care – PPO

## 2015-07-19 ENCOUNTER — Ambulatory Visit: Payer: Self-pay | Admitting: Nurse Practitioner

## 2015-07-19 ENCOUNTER — Other Ambulatory Visit: Payer: Self-pay | Admitting: *Deleted

## 2015-07-19 ENCOUNTER — Other Ambulatory Visit: Payer: Self-pay | Admitting: Nurse Practitioner

## 2015-07-19 ENCOUNTER — Other Ambulatory Visit (HOSPITAL_BASED_OUTPATIENT_CLINIC_OR_DEPARTMENT_OTHER): Payer: BC Managed Care – PPO

## 2015-07-19 DIAGNOSIS — C9110 Chronic lymphocytic leukemia of B-cell type not having achieved remission: Secondary | ICD-10-CM

## 2015-07-19 LAB — CBC WITH DIFFERENTIAL/PLATELET
BASO%: 0 % (ref 0.0–2.0)
Basophils Absolute: 0 10*3/uL (ref 0.0–0.1)
EOS ABS: 0 10*3/uL (ref 0.0–0.5)
EOS%: 0.3 % (ref 0.0–7.0)
HEMATOCRIT: 36.2 % — AB (ref 38.4–49.9)
HEMOGLOBIN: 11.5 g/dL — AB (ref 13.0–17.1)
LYMPH%: 27.5 % (ref 14.0–49.0)
MCH: 28.5 pg (ref 27.2–33.4)
MCHC: 31.8 g/dL — ABNORMAL LOW (ref 32.0–36.0)
MCV: 89.6 fL (ref 79.3–98.0)
MONO#: 0.5 10*3/uL (ref 0.1–0.9)
MONO%: 8.3 % (ref 0.0–14.0)
NEUT%: 63.9 % (ref 39.0–75.0)
NEUTROS ABS: 4.2 10*3/uL (ref 1.5–6.5)
PLATELETS: 147 10*3/uL (ref 140–400)
RBC: 4.04 10*6/uL — AB (ref 4.20–5.82)
RDW: 16.4 % — ABNORMAL HIGH (ref 11.0–14.6)
WBC: 6.5 10*3/uL (ref 4.0–10.3)
lymph#: 1.8 10*3/uL (ref 0.9–3.3)

## 2015-07-19 LAB — COMPREHENSIVE METABOLIC PANEL (CC13)
ALT: 40 U/L (ref 0–55)
AST: 33 U/L (ref 5–34)
Albumin: 3.5 g/dL (ref 3.5–5.0)
Alkaline Phosphatase: 98 U/L (ref 40–150)
Anion Gap: 5 mEq/L (ref 3–11)
BILIRUBIN TOTAL: 0.25 mg/dL (ref 0.20–1.20)
BUN: 51.1 mg/dL — AB (ref 7.0–26.0)
CALCIUM: 8.2 mg/dL — AB (ref 8.4–10.4)
CHLORIDE: 119 meq/L — AB (ref 98–109)
CO2: 18 meq/L — AB (ref 22–29)
CREATININE: 1.5 mg/dL — AB (ref 0.7–1.3)
EGFR: 46 mL/min/{1.73_m2} — ABNORMAL LOW (ref >90)
GLUCOSE: 96 mg/dL (ref 70–140)
POTASSIUM: 5.1 meq/L (ref 3.5–5.1)
Sodium: 142 mEq/L (ref 136–145)
Total Protein: 5.6 g/dL — ABNORMAL LOW (ref 6.4–8.3)

## 2015-07-19 LAB — MAGNESIUM (CC13): Magnesium: 2.1 mg/dl (ref 1.5–2.5)

## 2015-07-19 MED ORDER — HEPARIN SOD (PORK) LOCK FLUSH 100 UNIT/ML IV SOLN
500.0000 [IU] | Freq: Once | INTRAVENOUS | Status: DC | PRN
  Filled 2015-07-19: qty 5

## 2015-07-19 MED ORDER — SODIUM CHLORIDE 0.9 % IV SOLN
Freq: Once | INTRAVENOUS | Status: AC
  Administered 2015-07-19: 13:00:00 via INTRAVENOUS
  Filled 2015-07-19: qty 500

## 2015-07-19 NOTE — Patient Instructions (Signed)
Hypomagnesemia Magnesium is a common ion (mineral) in the body which is needed for metabolism. It is about how the body handles food and other chemical reactions necessary for life. Only about 2% of the magnesium in our body is found in the blood. When this is low, it is called hypomagnesemia. The blood will measure only a tiny amount of the magnesium in our body. When it is low in our blood, it does not mean that the whole body supply is low. The normal serum concentration ranges from 1.8-2.5 mEq/L. When the level gets to be less than 1.0 mEq/L, a number of problems begin to happen.  CAUSES   Receiving intravenous fluids without magnesium replacement.  Loss of magnesium from the bowel by nasogastric suction.  Loss of magnesium from nausea and vomiting or severe diarrhea. Any of the inflammatory bowel conditions can cause this.  Abuse of alcohol often leads to low serum magnesium.  An inherited form of magnesium loss happens when the kidneys lose magnesium. This is called familial or primary hypomagnesemia.  Some medications such as diuretics also cause the loss of magnesium. SYMPTOMS  These following problems are worse if the changes in magnesium levels come on suddenly.  Tremor.  Confusion.  Muscle weakness.  Oversensitive to sights and sounds.  Sensitive reflexes.  Depression.  Muscular fibrillations.  Overreactivity of the nerves.  Irritability.  Psychosis.  Spasms of the hand muscles.  Tetany (where the muscles go into uncontrollable spasms). DIAGNOSIS  This condition can be diagnosed by blood tests. TREATMENT   In an emergency, magnesium can be given intravenously (by vein).  If the condition is less worrisome, it can be corrected by diet. High levels of magnesium are found in green leafy vegetables, peas, beans, and nuts among other things. It can also be given through medications by mouth.  If it is being caused by medications, changes can be made.  If  alcohol is a problem, help is available if there are difficulties giving it up. Document Released: 09/03/2005 Document Revised: 04/24/2014 Document Reviewed: 07/28/2008 [employer] Patient Information 2015 [employer]. This information is not intended to replace advice given to you by your health care provider. Make sure you discuss any questions you have with your health care provider.  

## 2015-07-25 ENCOUNTER — Other Ambulatory Visit: Payer: Self-pay | Admitting: Nurse Practitioner

## 2015-07-26 ENCOUNTER — Other Ambulatory Visit (HOSPITAL_BASED_OUTPATIENT_CLINIC_OR_DEPARTMENT_OTHER): Payer: BC Managed Care – PPO

## 2015-07-26 ENCOUNTER — Ambulatory Visit (HOSPITAL_BASED_OUTPATIENT_CLINIC_OR_DEPARTMENT_OTHER): Payer: BC Managed Care – PPO

## 2015-07-26 DIAGNOSIS — C9110 Chronic lymphocytic leukemia of B-cell type not having achieved remission: Secondary | ICD-10-CM

## 2015-07-26 DIAGNOSIS — D801 Nonfamilial hypogammaglobulinemia: Secondary | ICD-10-CM

## 2015-07-26 DIAGNOSIS — D89811 Chronic graft-versus-host disease: Secondary | ICD-10-CM

## 2015-07-26 LAB — COMPREHENSIVE METABOLIC PANEL (CC13)
ALBUMIN: 3.4 g/dL — AB (ref 3.5–5.0)
ALK PHOS: 94 U/L (ref 40–150)
ALT: 40 U/L (ref 0–55)
AST: 25 U/L (ref 5–34)
Anion Gap: 7 mEq/L (ref 3–11)
BILIRUBIN TOTAL: 0.29 mg/dL (ref 0.20–1.20)
BUN: 52.4 mg/dL — ABNORMAL HIGH (ref 7.0–26.0)
CALCIUM: 8.1 mg/dL — AB (ref 8.4–10.4)
CHLORIDE: 118 meq/L — AB (ref 98–109)
CO2: 16 mEq/L — ABNORMAL LOW (ref 22–29)
Creatinine: 1.6 mg/dL — ABNORMAL HIGH (ref 0.7–1.3)
EGFR: 44 mL/min/{1.73_m2} — AB (ref >90)
GLUCOSE: 121 mg/dL (ref 70–140)
Potassium: 4.9 mEq/L (ref 3.5–5.1)
Sodium: 142 mEq/L (ref 136–145)
TOTAL PROTEIN: 5.4 g/dL — AB (ref 6.4–8.3)

## 2015-07-26 LAB — CBC WITH DIFFERENTIAL/PLATELET
BASO%: 0.6 % (ref 0.0–2.0)
Basophils Absolute: 0 10*3/uL (ref 0.0–0.1)
EOS%: 1.3 % (ref 0.0–7.0)
Eosinophils Absolute: 0.1 10*3/uL (ref 0.0–0.5)
HCT: 36.7 % — ABNORMAL LOW (ref 38.4–49.9)
HEMOGLOBIN: 11.6 g/dL — AB (ref 13.0–17.1)
LYMPH%: 25.3 % (ref 14.0–49.0)
MCH: 28.4 pg (ref 27.2–33.4)
MCHC: 31.7 g/dL — AB (ref 32.0–36.0)
MCV: 89.4 fL (ref 79.3–98.0)
MONO#: 0.4 10*3/uL (ref 0.1–0.9)
MONO%: 7.7 % (ref 0.0–14.0)
NEUT#: 3.5 10*3/uL (ref 1.5–6.5)
NEUT%: 65.1 % (ref 39.0–75.0)
Platelets: 167 10*3/uL (ref 140–400)
RBC: 4.1 10*6/uL — AB (ref 4.20–5.82)
RDW: 16.2 % — AB (ref 11.0–14.6)
WBC: 5.4 10*3/uL (ref 4.0–10.3)
lymph#: 1.4 10*3/uL (ref 0.9–3.3)

## 2015-07-26 LAB — MAGNESIUM (CC13): Magnesium: 1.7 mg/dl (ref 1.5–2.5)

## 2015-07-26 MED ORDER — IMMUNE GLOBULIN (HUMAN) 10 GM/100ML IV SOLN
100.0000 g | Freq: Once | INTRAVENOUS | Status: AC
  Administered 2015-07-26: 100 g via INTRAVENOUS
  Filled 2015-07-26: qty 1000

## 2015-07-26 MED ORDER — SODIUM CHLORIDE 0.9 % IJ SOLN
10.0000 mL | INTRAMUSCULAR | Status: DC | PRN
  Administered 2015-07-26: 10 mL via INTRAVENOUS
  Filled 2015-07-26: qty 10

## 2015-07-26 MED ORDER — HEPARIN SOD (PORK) LOCK FLUSH 100 UNIT/ML IV SOLN
500.0000 [IU] | Freq: Once | INTRAVENOUS | Status: AC
  Administered 2015-07-26: 500 [IU] via INTRAVENOUS
  Filled 2015-07-26: qty 5

## 2015-07-26 MED ORDER — SODIUM CHLORIDE 0.9 % IV SOLN: INTRAVENOUS | Status: DC

## 2015-07-26 MED ORDER — SODIUM CHLORIDE 0.9 % IV SOLN
Freq: Once | INTRAVENOUS | Status: AC
  Administered 2015-07-26: 10:00:00 via INTRAVENOUS
  Filled 2015-07-26: qty 500

## 2015-07-26 MED ORDER — SODIUM CHLORIDE 0.9 % IV SOLN
INTRAVENOUS | Status: DC
  Administered 2015-07-26: 10:00:00 via INTRAVENOUS

## 2015-07-26 NOTE — Patient Instructions (Signed)
Immune Globulin Injection What is this medicine? IMMUNE GLOBULIN (im MUNE GLOB yoo lin) helps to prevent or reduce the severity of certain infections in patients who are at risk. This medicine is collected from the pooled blood of many donors. It is used to treat immune system problems, thrombocytopenia, and Kawasaki syndrome. This medicine may be used for other purposes; ask your health care provider or pharmacist if you have questions. COMMON BRAND NAME(S): Baygam, BIVIGAM, Carimune, Carimune NF, Flebogamma, Flebogamma DIF, GamaSTAN S/D, Gamimune N, Gammagard S/D, Gammaked, Gammaplex, Gammar-P IV, Gamunex, Gamunex-C, Hizentra, Iveegam, Iveegam EN, Octagam, Panglobulin, Panglobulin NF, Polygam S/D, Privigen, Sandoglobulin, Venoglobulin-S, Vigam, Vivaglobulin What should I tell my health care provider before I take this medicine? They need to know if you have any of these conditions: - diabetes - extremely low or no immune antibodies in the blood - heart disease - history of blood clots - hyperprolinemia - infection in the blood, sepsis - kidney disease - taking medicine that may change kidney function - ask your health care provider about your medicine - an unusual or allergic reaction to human immune globulin, albumin, maltose, sucrose, polysorbate 80, other medicines, foods, dyes, or preservatives - pregnant or trying to get pregnant - breast-feeding How should I use this medicine? This medicine is for injection into a muscle or infusion into a vein or skin. It is usually given by a health care professional in a hospital or clinic setting. In rare cases, some brands of this medicine might be given at home. You will be taught how to give this medicine. Use exactly as directed. Take your medicine at regular intervals. Do not take your medicine more often than directed. Talk to your pediatrician regarding the use of this medicine in children. Special care may be needed. Overdosage:  If you think you have taken too much of this medicine contact a poison control center or emergency room at once. NOTE: This medicine is only for you. Do not share this medicine with others. What if I miss a dose? It is important not to miss your dose. Call your doctor or health care professional if you are unable to keep an appointment. If you give yourself the medicine and you miss a dose, take it as soon as you can. If it is almost time for your next dose, take only that dose. Do not take double or extra doses. What may interact with this medicine? -aspirin and aspirin-like medicines -cisplatin -cyclosporine -medicines for infection like acyclovir, adefovir, amphotericin B, bacitracin, cidofovir, foscarnet, ganciclovir, gentamicin, pentamidine, vancomycin -NSAIDS, medicines for pain and inflammation, like ibuprofen or naproxen -pamidronate -vaccines -zoledronic acid This list may not describe all possible interactions. Give your health care provider a list of all the medicines, herbs, non-prescription drugs, or dietary supplements you use. Also tell them if you smoke, drink alcohol, or use illegal drugs. Some items may interact with your medicine. What should I watch for while using this medicine? Your condition will be monitored carefully while you are receiving this medicine. This medicine is made from pooled blood donations of many different people. It may be possible to pass an infection in this medicine. However, the donors are screened for infections and all products are tested for HIV and hepatitis. The medicine is treated to kill most or all bacteria and viruses. Talk to your doctor about the risks and benefits of this medicine. Do not have vaccinations for at least 14 days before, or until at least 3 months after receiving this   medicine. What side effects may I notice from receiving this medicine? Side effects that you should report to your doctor or health care professional as soon as  possible: -allergic reactions like skin rash, itching or hives, swelling of the face, lips, or tongue -breathing problems -chest pain or tightness -fever, chills -headache with nausea, vomiting -neck pain or difficulty moving neck -pain when moving eyes -pain, swelling, warmth in the leg -problems with balance, talking, walking -sudden weight gain -swelling of the ankles, feet, hands -trouble passing urine or change in the amount of urine Side effects that usually do not require medical attention (report to your doctor or health care professional if they continue or are bothersome): -dizzy, drowsy -flushing -increased sweating -leg cramps -muscle aches and pains -pain at site where injected This list may not describe all possible side effects. Call your doctor for medical advice about side effects. You may report side effects to FDA at 1-800-FDA-1088. Where should I keep my medicine? Keep out of the reach of children. This drug is usually given in a hospital or clinic and will not be stored at home. In rare cases, some brands of this medicine may be given at home. If you are using this medicine at home, you will be instructed on how to store this medicine. Throw away any unused medicine after the expiration date on the label. NOTE: This sheet is a summary. It may not cover all possible information. If you have questions about this medicine, talk to your doctor, pharmacist, or health care provider.  2015, Elsevier/Gold Standard. (2009-02-28 11:44:49)  Magnesium This is a blood test which measures the amount of magnesium in your blood. Most of the magnesium in your body exists in your cells. However, the magnesium in your blood is important for many processes. It is important for your nerves to be able to conduct electrical energy. This is important in heart patients with higher heartbeats. When your heart beats and then gets ready to beat again, it repolarizes. If the magnesium levels  are low or high, it can affect the accuracy of the repolarization process. The magnesium levels are also monitored during pregnancy. This is done to determine if the expectant mother may have preeclampsia or toxemia. Magnesium is often used for treatment of these problems. PREPARATION FOR TEST No preparation or fasting is needed. A blood sample may be taken by inserting a needle into a vein in the arm.  NORMAL FINDINGS  Adult: 1.3 to 2.1 mEq/L or 0.65 to 1.05 mmol/L (SI units)  Child: 1.4 to 1.7 mEq/L  Newborn: 1.4 to 2 mEq/L Possible critical values: less than 0.5 mEq/L or greater than 3 mEq/L Ranges for normal findings may vary among different laboratories and hospitals. You should always check with your caregiver after having lab work or other tests done to discuss the meaning of your test results and whether your values are considered within normal limits. MEANING OF TEST  Your caregiver will go over the test results with you. Your caregiver will discuss the importance and meaning of your results. He or she will also discuss treatment options and additional tests, if needed. OBTAINING THE TEST RESULTS It is your responsibility to obtain your test results. Ask the lab or department performing the test when and how you will get your results. Document Released: 01/10/2005 Document Revised: 03/01/2012 Document Reviewed: 11/18/2008 [facility] Patient Information 2015 [place], [place]. This information is not intended to replace advice given to you by your health care provider. Make sure  you discuss any questions you have with your health care provider.

## 2015-08-02 ENCOUNTER — Ambulatory Visit (HOSPITAL_BASED_OUTPATIENT_CLINIC_OR_DEPARTMENT_OTHER): Payer: BC Managed Care – PPO | Admitting: Nurse Practitioner

## 2015-08-02 ENCOUNTER — Telehealth: Payer: Self-pay | Admitting: Nurse Practitioner

## 2015-08-02 ENCOUNTER — Encounter: Payer: Self-pay | Admitting: Nurse Practitioner

## 2015-08-02 ENCOUNTER — Other Ambulatory Visit (HOSPITAL_BASED_OUTPATIENT_CLINIC_OR_DEPARTMENT_OTHER): Payer: BC Managed Care – PPO

## 2015-08-02 ENCOUNTER — Ambulatory Visit (HOSPITAL_BASED_OUTPATIENT_CLINIC_OR_DEPARTMENT_OTHER): Payer: BC Managed Care – PPO

## 2015-08-02 VITALS — BP 110/63 | HR 71 | Temp 98.1°F | Resp 18 | Ht 66.0 in | Wt 222.3 lb

## 2015-08-02 DIAGNOSIS — D89811 Chronic graft-versus-host disease: Secondary | ICD-10-CM | POA: Diagnosis not present

## 2015-08-02 DIAGNOSIS — D801 Nonfamilial hypogammaglobulinemia: Secondary | ICD-10-CM

## 2015-08-02 DIAGNOSIS — T8609 Other complications of bone marrow transplant: Secondary | ICD-10-CM

## 2015-08-02 DIAGNOSIS — Z7952 Long term (current) use of systemic steroids: Secondary | ICD-10-CM

## 2015-08-02 DIAGNOSIS — E860 Dehydration: Secondary | ICD-10-CM

## 2015-08-02 DIAGNOSIS — C9110 Chronic lymphocytic leukemia of B-cell type not having achieved remission: Secondary | ICD-10-CM

## 2015-08-02 LAB — CBC WITH DIFFERENTIAL/PLATELET
BASO%: 0 % (ref 0.0–2.0)
Basophils Absolute: 0 10*3/uL (ref 0.0–0.1)
EOS%: 0.4 % (ref 0.0–7.0)
Eosinophils Absolute: 0 10*3/uL (ref 0.0–0.5)
HCT: 37 % — ABNORMAL LOW (ref 38.4–49.9)
HGB: 11.9 g/dL — ABNORMAL LOW (ref 13.0–17.1)
LYMPH%: 36.7 % (ref 14.0–49.0)
MCH: 28.3 pg (ref 27.2–33.4)
MCHC: 32.2 g/dL (ref 32.0–36.0)
MCV: 88.1 fL (ref 79.3–98.0)
MONO#: 0.5 10*3/uL (ref 0.1–0.9)
MONO%: 9.9 % (ref 0.0–14.0)
NEUT#: 2.7 10*3/uL (ref 1.5–6.5)
NEUT%: 53 % (ref 39.0–75.0)
Platelets: 118 10*3/uL — ABNORMAL LOW (ref 140–400)
RBC: 4.2 10*6/uL (ref 4.20–5.82)
RDW: 15.9 % — AB (ref 11.0–14.6)
WBC: 5.1 10*3/uL (ref 4.0–10.3)
lymph#: 1.9 10*3/uL (ref 0.9–3.3)

## 2015-08-02 LAB — COMPREHENSIVE METABOLIC PANEL (CC13)
ALBUMIN: 3.4 g/dL — AB (ref 3.5–5.0)
ALK PHOS: 109 U/L (ref 40–150)
ALT: 110 U/L — ABNORMAL HIGH (ref 0–55)
AST: 72 U/L — AB (ref 5–34)
Anion Gap: 6 mEq/L (ref 3–11)
BUN: 49.9 mg/dL — AB (ref 7.0–26.0)
CHLORIDE: 112 meq/L — AB (ref 98–109)
CO2: 18 mEq/L — ABNORMAL LOW (ref 22–29)
Calcium: 8.7 mg/dL (ref 8.4–10.4)
Creatinine: 1.7 mg/dL — ABNORMAL HIGH (ref 0.7–1.3)
EGFR: 40 mL/min/{1.73_m2} — ABNORMAL LOW (ref >90)
Glucose: 132 mg/dl (ref 70–140)
POTASSIUM: 5.4 meq/L — AB (ref 3.5–5.1)
Sodium: 137 mEq/L (ref 136–145)
TOTAL PROTEIN: 6.4 g/dL (ref 6.4–8.3)
Total Bilirubin: 0.26 mg/dL (ref 0.20–1.20)

## 2015-08-02 LAB — MAGNESIUM (CC13): Magnesium: 1.7 mg/dl (ref 1.5–2.5)

## 2015-08-02 MED ORDER — SODIUM CHLORIDE 0.9 % IJ SOLN
10.0000 mL | INTRAMUSCULAR | Status: DC | PRN
  Administered 2015-08-02: 10 mL via INTRAVENOUS
  Filled 2015-08-02: qty 10

## 2015-08-02 MED ORDER — SODIUM CHLORIDE 0.9 % IV SOLN
Freq: Once | INTRAVENOUS | Status: AC
  Administered 2015-08-02: 10:00:00 via INTRAVENOUS
  Filled 2015-08-02: qty 500

## 2015-08-02 MED ORDER — HEPARIN SOD (PORK) LOCK FLUSH 100 UNIT/ML IV SOLN
500.0000 [IU] | Freq: Once | INTRAVENOUS | Status: AC | PRN
  Administered 2015-08-02: 500 [IU]
  Filled 2015-08-02: qty 5

## 2015-08-02 MED ORDER — FUROSEMIDE 40 MG PO TABS: 40.0000 mg | ORAL_TABLET | Freq: Two times a day (BID) | ORAL | Status: DC

## 2015-08-02 MED ORDER — ALTEPLASE 2 MG IJ SOLR
2.0000 mg | Freq: Once | INTRAMUSCULAR | Status: DC | PRN
  Filled 2015-08-02: qty 2

## 2015-08-02 NOTE — Telephone Encounter (Signed)
ivf appointments added per pof and patient will get a new avs in chemo

## 2015-08-02 NOTE — Progress Notes (Signed)
ID: Timothy [name]   DOB: 1946-01-27  MR#: [id number]  VVZ#:[id number]  JQG:BEEF, Timothy Saxon, MD SU: OTHER MD: Timothy [name]; Timothy Shin, MD; Timothy [name], Timothy Hatcher,MD; Timothy Rosenthal, MD; [facility] in [place], [place]:  Timothy Duval, RN [phone]), Timothy [name] M.D.  CHIEF COMPLAINT:  CLL, status post allogeneic stem cell transplant, GVHD CURRENT TREATMENT: Immunosuppression, supportive care  HISTORY OF CLL: From the original intake note:  We have very complete records from Dr. Racheal Patches in [place], and in summary:  The patient was initially diagnosed in August 2000, with a white cell count of 23,600, but normal hemoglobin and platelets, and no significant symptomatology. Over the next several years his white cell count drifted up, and he eventually developed some symptoms of night sweats in particular, leading to treatment with fludarabine, Cytoxan and rituxan for five cycles given between December 2006 and May 2007.  We have CT scans from June 2006, November 2006 and April 2007, and comparing the November 2006 and April 2007 scans, there was near complete response. He had subsequent therapy in [place] as detailed below, but with decreased response, leading to allogeneic stem-cell transplant at the [facility] 02/24/2012.  Subsequent history is as detailed below.  INTERVAL HISTORY: Timothy [name] returns today for follow up of his chronic lymphoid leukemia and graft-versus-host disease accompanied his wife, Timothy [name]. He is due for IV fluids and magnesium today. Last week he was given IVIG for an IgG level below 500. He tolerated this well with no reactions.   REVIEW OF SYSTEMS: Timothy [name] attempted to reduce his prednisone to alternating 7.$RemoveBeforeDE'5mg'ozaTwRVeElHcggL$  and $Re'5mg'heP$  days, unfortunately his diarrhea got worse during that period of time. He had to return to 7.$RemoveBef'5mg'ntrXzBHAcd$  daily. He is able to control his stools on imodium alone now. He has finally been fit  appropriately for compression stockings, but he does not wear them as often as advised. His nephrologist increased his lasix to $Remove'40mg'qGbgGbl$  BID. He denies fevers, chills, unexplained weight loss, or excessive fatigue. He feels somewhat stronger now that he is walking more. His blood sugars are well controlled. A detailed review of systems is otherwise stable.  PAST MEDICAL HISTORY: Past Medical History  Diagnosis Date  . 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 Chronic graft-versus-host disease   . Diverticular disease   . Hyperlipidemia   . Obesity   . Hypertension   . Hiatal hernia   . CMV (cytomegalovirus) antibody positive     pre-transplant, with seroconversion x2 pst-transplant  . Right bundle branch block     pre-transplant  . CKD (chronic kidney disease) stage 2, GFR 60-89 ml/min   . Pancytopenia   . Atrial fibrillation     post-transplant  . Myopathy   . 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 Chronic graft-versus-host disease   . 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 Diarrhea in adult patient 12/05/2012    Due to active GVHD  . Rash of face 12/05/2012    Due to GVHD  . Hypomagnesemia 01/26/2013  . Left hip pain 12/01/2013  . Steroid-induced diabetes     novalog  . Leukemia, chronic lymphoid   . CLL (chronic lymphocytic leukemia) 12/05/2012    Dx 07/1999; started Rx 12/06  AlloBMT 3/13    PAST SURGICAL HISTORY: Past Surgical History  Procedure Laterality Date  . Tonsillectomy and adenoidectomy    . Bone marrow transplant    . Flexible sigmoidoscopy  11/17/2012    Procedure:  FLEXIBLE SIGMOIDOSCOPY;  Surgeon: Jeryl Columbia, MD;  Location: [facility];  Service: Endoscopy;  Laterality: N/A;  Dr Watt Climes states will be admitted to rooom 1339 11/16/12  . Esophagogastroduodenoscopy  11/17/2012    Procedure: ESOPHAGOGASTRODUODENOSCOPY (EGD);  Surgeon: Jeryl Columbia, MD;  Location: [facility];  Service: Endoscopy;  Laterality: N/A;    FAMILY  HISTORY Family History  Problem Relation Age of Onset  . Cancer Father   The patient's father died from complications of chronic lymphocytic leukemia at the age of 71.  It had been diagnosed seven years before when he was 81.  The patient is enrolled in a familial chronic lymphocytic leukemia study out of the [employer].  The patient's mother is 43, alive, unfortunately suffering with dementia, and he has a brother, 108, who is otherwise in fair health.   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  His wife of >40 years, Timothy [name], is a homemaker.  Their daughter, Timothy [name], lives in [place].  She also is a Agricultural engineer.  The patient has an 10 year old grandson and an 88-year-old granddaughter, and that is really the main reason he moved to this area.  He is a Tourist information centre manager.     ADVANCED DIRECTIVES: In place  HEALTH MAINTENANCE: (Updated 04/13/2014) Social History  Substance Use Topics  . Smoking status: Never Smoker   . Smokeless tobacco: Never Used  . Alcohol Use: No     Colonoscopy: Nov 2013, Dr. Watt Climes  PSA: Not on file  Bone density:  Feb 2014;  Patient also has known insufficiency and pathologic fractures in addition to his long-standing history of steroid use.  Lipid panel: Jan 2015, elevated    Allergies  Allergen Reactions  . Benadryl [Diphenhydramine Hcl]     "Restless leg syndrome"    Current Outpatient Prescriptions  Medication Sig Dispense Refill  . acyclovir (ZOVIRAX) 400 MG tablet Take 2 tablets (800 mg total) by mouth 2 (two) times daily. 120 tablet 1  . budesonide (ENTOCORT EC) 3 MG 24 hr capsule TAKE 1 CAPSULE BY MOUTH 3 TIMES DAILY 90 capsule 6  . cholestyramine (QUESTRAN) 4 G packet Take 1 packet (4 g total) by mouth 2 (two) times daily.  60 each 12  . diltiazem (CARDIZEM CD) 240 MG 24 hr capsule TAKE 1 CAPSULE BY MOUTH ONCE DAILY 30 capsule 3  . fluconazole (DIFLUCAN) 100 MG tablet Take 1 tablet (100 mg total) by mouth once. 30 tablet 3  . furosemide (LASIX) 40 MG tablet Take 1 tablet (40 mg total) by mouth 2 (two) times daily. 60 tablet 2  . insulin aspart (NOVOLOG) 100 unit/mL injection Inject 12-20 Units into the skin. Sliding scale    . Insulin Pen Needle (B-D UF III MINI PEN NEEDLES) 31G X 5 MM MISC Use five daily with insulin as directed. 100 each 5  . labetalol (NORMODYNE) 200 MG tablet Take 2 tablets (400 mg total) by mouth 2 (two) times daily. 120 tablet 1  . Lidocaine-Hydrocortisone Ace 3-0.5 % KIT Apply 1 application topically as needed (for pain). 1 each 3  . lisinopril (PRINIVIL,ZESTRIL) 10 MG tablet Take 10 mg by mouth daily.     Marland Kitchen loratadine (CLARITIN) 10 MG tablet Take 10 mg by mouth daily.    Marland Kitchen NOVOLOG FLEXPEN 100 UNIT/ML FlexPen     .  omeprazole (PRILOSEC) 20 MG capsule Take 1 capsule (20 mg total) by mouth daily. 30 capsule 5  . ONE TOUCH ULTRA TEST test strip TEST BEFORE MEALS AND AT BEDTIME. 300 each 2  . ONETOUCH DELICA LANCETS 28B MISC USE TO TEST BEFORE MEALS AND AT BEDTIME 300 each 0  . predniSONE (DELTASONE) 5 MG tablet Take 1 tablet (5 mg total) by mouth daily with breakfast. Pt is to alternate days with 7.5 mg  and 5 mg. 60 tablet 2  . sertraline (ZOLOFT) 50 MG tablet Take 1 tablet (50 mg total) by mouth every morning. 90 tablet 5  . sulfamethoxazole-trimethoprim (BACTRIM DS,SEPTRA DS) 800-160 MG per tablet Take 1 tablet by mouth daily. 30 tablet 5  . tacrolimus (PROGRAF) 0.5 MG capsule TAKE 3 CAPSULES BY MOUTH TWICE DAILY 180 capsule 4  . TRESIBA FLEXTOUCH 200 UNIT/ML SOPN 50 Units daily.     . VOLTAREN 1 % GEL Apply 2 g topically 2 (two) times daily. Applied to back     No current facility-administered medications for this visit.   Facility-Administered Medications Ordered in Other Visits   Medication Dose Route Frequency Provider Last Rate Last Dose  . 0.9 %  sodium chloride infusion   Intravenous Continuous Amy G Berry, PA-C 500 mL/hr at 03/12/13 0900    . alteplase (CATHFLO ACTIVASE) injection 2 mg  2 mg Intracatheter Once PRN Chauncey Cruel, MD      . heparin lock flush 100 unit/mL  500 Units Intracatheter Once PRN Chauncey Cruel, MD      . sodium chloride 0.9 % injection 10 mL  10 mL Intravenous PRN Chauncey Cruel, MD   10 mL at 08/11/12 1606    OBJECTIVE: Middle-aged white man in no acute distress  Filed Vitals:   08/02/15 0901  BP: 110/63  Pulse: 71  Temp: 98.1 F (36.7 C)  Resp: 18  Body mass index is 35.9 kg/(m^2).  ECOG: 2 Filed Weights   08/02/15 0901  Weight: 222 lb 4.8 oz (100.835 kg)    Skin: warm, dry, chronic purpura lesions HEENT: sclerae anicteric, conjunctivae pink, oropharynx clear. No thrush or mucositis.  Lymph Nodes: No cervical or supraclavicular lymphadenopathy  Lungs: clear to auscultation bilaterally, no rales, wheezes, or rhonci  Heart: regular rate and rhythm  Abdomen: round, soft, non tender, positive bowel sounds  Musculoskeletal: No focal spinal tenderness, +2 bilateral lower extremity edema Neuro: non focal, well oriented, positive affect   LABS:  CBC    Component Value Date/Time   WBC 5.1 08/02/2015 0844   WBC 3.2* 02/27/2015 0805   RBC 4.20 08/02/2015 0844   RBC 3.64* 02/27/2015 0805   RBC 3.82* 03/16/2013 1400   HGB 11.9* 08/02/2015 0844   HGB 11.3* 02/27/2015 0805   HCT 37.0* 08/02/2015 0844   HCT 34.1* 02/27/2015 0805   PLT 118* 08/02/2015 0844   PLT 120* 02/27/2015 0805   MCV 88.1 08/02/2015 0844   MCV 93.7 02/27/2015 0805   MCH 28.3 08/02/2015 0844   MCH 31.0 02/27/2015 0805   MCHC 32.2 08/02/2015 0844   MCHC 33.1 02/27/2015 0805   RDW 15.9* 08/02/2015 0844   RDW 16.2* 02/27/2015 0805   LYMPHSABS 1.9 08/02/2015 0844   LYMPHSABS 1.4 03/18/2013 0615   MONOABS 0.5 08/02/2015 0844   MONOABS 0.3  03/18/2013 0615   EOSABS 0.0 08/02/2015 0844   EOSABS 0.0 03/18/2013 0615   BASOSABS 0.0 08/02/2015 0844   BASOSABS 0.0 03/18/2013 0615  Chemistry      Component Value Date/Time   NA 137 08/02/2015 0845   NA 139 04/05/2015 0911   K 5.4* 08/02/2015 0845   K 4.6 04/05/2015 0911   CL 110 04/05/2015 0911   CL 101 06/15/2013 1034   CO2 18* 08/02/2015 0845   CO2 22 04/05/2015 0911   BUN 49.9* 08/02/2015 0845   BUN 45* 04/05/2015 0911   CREATININE 1.7* 08/02/2015 0845   CREATININE 1.23 04/05/2015 0911      Component Value Date/Time   CALCIUM 8.7 08/02/2015 0845   CALCIUM 8.4 04/05/2015 0911   ALKPHOS 109 08/02/2015 0845   ALKPHOS 108 04/05/2015 0911   AST 72* 08/02/2015 0845   AST 50* 04/05/2015 0911   ALT 110* 08/02/2015 0845   ALT 59* 04/05/2015 0911   BILITOT 0.26 08/02/2015 0845   BILITOT 0.5 04/05/2015 0911     STUDIES: No results found.   ASSESSMENT: 69 y.o. [place] man with a history of well-differentiated lymphocytic lymphoma/ chronic lymphoid leukemia initially diagnosed in 2000, not requiring intervention until 2006; with multiple chromosomal abnormalities.  His treatment history is as follows:  (1) fludarabine/cyclophosphamide/rituximab x5 completed May 2007.   (2) rituximab for 8 doses October 2010, with partial response   (3) Leustatin and ofatumumab weekly x8 July to September 2011 followed by maintenance ofatumumab  every 2 months, with initial response but rising counts September 2012   (4) status-post unrelated donor stem-cell transplant 02/24/2012 at the [facility]  (a) conditioning regimen consisted of fludarabine + TBI at 200 cGy, followed by rituximab x27;  (b) CMV reactivation x3 (patient CMV positive, donor negative), s/p ganciclovir treatment; 3d reactivation August 2013, s/p gancyclovir, with negative PCR mid-September 2013; last gancyclovir dose 10/06/2012 (c) Chronic GVHD: involving gut and skin, treated with steroids, tacrolimus and MMF.   MMF was eventually d/c'd and tacrolimus currently at a dose of 1.$RemoveB'5mg'eSuGkYvx$  BID (d) atrial fibrillation: resolved on brief amiodarone regimen (e) steroid-induced myopathy: improving  (f) hypomagnesemia: improved after d/c gancyclovir, needs continuing support (g) hypogammaglobulinemia: requiring IVIG most recently 08/03/2014. (h) history of elevated triglycerides (606 on 07/14/2012)  (i) adrenal insufficiency: on prednisone and budesonide (j) pancytopenia,resolved (k) brief episode of neutropenia ([place] [address]) February 2015, accompanied by diarrhea; resolved   (5) restaging studies February-March 2015 including CT scans, flow cytometry, and bone marrow biopsy, showed no evidence of residual chronic lymphoid leukemia.  (a) repeat bone marrow biopsy 02/27/2015 showed no evidence of chronic lymphoid leukemia and also no dyspoiesis. Flow cytometry showed no B cells.  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 Status post kyphoplasty at L3-4 in June 2014. Also with evidence of rib fractures and insufficiency fractures bilaterally of the sacral  alae, noted by CT in March 2015. --   Denosumab started 12/08/2013, given as prolia Q6 months which is what has been approved by his insurance, most recent dose 03/29/2015  (12) chronic back pain and hip pain controlled with OxyContin and hydrocodone/APAP.  (12) nausea: well controlled on current meds  (13)  Positive c.diff, 03/08/2013, on  Flagyl 500 mg TID  x 20 days, then on oral vanco with Questran, showing improvement; positive when repeated April 2014; Negative x 3 since then  (14) persistently increased BUN and potassium  (15)  Hypertension, on labetalol, cardizem, lisinopril, and furosemide; managed by Dr. Brigitte Pulse  (16) steroid induced hyperglycemia/ DM II: managed by Dr Brigitte Pulse   (17) hypogammaglobulinemia-- requiring intermittent supplementation, most recent dose 05/10/2015  (18) squamous cell CA in situ removed from left parietal scalp October 2014  PLAN: Coralyn Mark did not tolerate the reduction in his prednisone. He will continue on 7.$RemoveBefor'5mg'kfETtZctKPzR$  daily for the next few weeks as this is the lowest dose that controls his GVHD. The labs were reviewed in detail and his ALT and ALT are elevated for the first time in a few months. His creatinine is also up to 1.7. He will proceed with his IVF with magnesium as planned today, and we will continue to monitor these values. I am not sure that he will be able to continue the lasix at this dose if his creatinine continues to rise.  He will continue weekly fluids and magnesium infusions. He will return in 4 weeks for a follow up visit. He understands and agrees with this plan. He has been encouraged to call with any issues that might arise before his next visit here.   Laurie Panda, NP 08/02/2015

## 2015-08-02 NOTE — Patient Instructions (Signed)
Hypomagnesemia Magnesium is a common ion (mineral) in the body which is needed for metabolism. It is about how the body handles food and other chemical reactions necessary for life. Only about 2% of the magnesium in our body is found in the blood. When this is low, it is called hypomagnesemia. The blood will measure only a tiny amount of the magnesium in our body. When it is low in our blood, it does not mean that the whole body supply is low. The normal serum concentration ranges from 1.8-2.5 mEq/L. When the level gets to be less than 1.0 mEq/L, a number of problems begin to happen.  CAUSES   Receiving intravenous fluids without magnesium replacement.  Loss of magnesium from the bowel by nasogastric suction.  Loss of magnesium from nausea and vomiting or severe diarrhea. Any of the inflammatory bowel conditions can cause this.  Abuse of alcohol often leads to low serum magnesium.  An inherited form of magnesium loss happens when the kidneys lose magnesium. This is called familial or primary hypomagnesemia.  Some medications such as diuretics also cause the loss of magnesium. SYMPTOMS  These following problems are worse if the changes in magnesium levels come on suddenly.  Tremor.  Confusion.  Muscle weakness.  Oversensitive to sights and sounds.  Sensitive reflexes.  Depression.  Muscular fibrillations.  Overreactivity of the nerves.  Irritability.  Psychosis.  Spasms of the hand muscles.  Tetany (where the muscles go into uncontrollable spasms). DIAGNOSIS  This condition can be diagnosed by blood tests. TREATMENT   In an emergency, magnesium can be given intravenously (by vein).  If the condition is less worrisome, it can be corrected by diet. High levels of magnesium are found in green leafy vegetables, peas, beans, and nuts among other things. It can also be given through medications by mouth.  If it is being caused by medications, changes can be made.  If  alcohol is a problem, help is available if there are difficulties giving it up. Document Released: 09/03/2005 Document Revised: 04/24/2014 Document Reviewed: 07/28/2008 [employer] Patient Information 2015 [employer]. This information is not intended to replace advice given to you by your health care provider. Make sure you discuss any questions you have with your health care provider.  

## 2015-08-09 ENCOUNTER — Ambulatory Visit (HOSPITAL_BASED_OUTPATIENT_CLINIC_OR_DEPARTMENT_OTHER): Payer: BC Managed Care – PPO

## 2015-08-09 ENCOUNTER — Other Ambulatory Visit (HOSPITAL_BASED_OUTPATIENT_CLINIC_OR_DEPARTMENT_OTHER): Payer: BC Managed Care – PPO

## 2015-08-09 ENCOUNTER — Other Ambulatory Visit: Payer: Self-pay | Admitting: *Deleted

## 2015-08-09 DIAGNOSIS — C9110 Chronic lymphocytic leukemia of B-cell type not having achieved remission: Secondary | ICD-10-CM

## 2015-08-09 LAB — COMPREHENSIVE METABOLIC PANEL (CC13)
ALBUMIN: 3.3 g/dL — AB (ref 3.5–5.0)
ALK PHOS: 87 U/L (ref 40–150)
ALT: 76 U/L — ABNORMAL HIGH (ref 0–55)
ANION GAP: 8 meq/L (ref 3–11)
AST: 47 U/L — ABNORMAL HIGH (ref 5–34)
BUN: 64.6 mg/dL — ABNORMAL HIGH (ref 7.0–26.0)
CALCIUM: 8.5 mg/dL (ref 8.4–10.4)
CO2: 19 mEq/L — ABNORMAL LOW (ref 22–29)
Chloride: 112 mEq/L — ABNORMAL HIGH (ref 98–109)
Creatinine: 1.9 mg/dL — ABNORMAL HIGH (ref 0.7–1.3)
EGFR: 35 mL/min/{1.73_m2} — AB (ref >90)
Glucose: 201 mg/dl — ABNORMAL HIGH (ref 70–140)
POTASSIUM: 5.3 meq/L — AB (ref 3.5–5.1)
Sodium: 138 mEq/L (ref 136–145)
Total Bilirubin: 0.26 mg/dL (ref 0.20–1.20)
Total Protein: 6 g/dL — ABNORMAL LOW (ref 6.4–8.3)

## 2015-08-09 LAB — CBC WITH DIFFERENTIAL/PLATELET
BASO%: 0.4 % (ref 0.0–2.0)
BASOS ABS: 0 10*3/uL (ref 0.0–0.1)
EOS ABS: 0 10*3/uL (ref 0.0–0.5)
EOS%: 0.4 % (ref 0.0–7.0)
HCT: 36.4 % — ABNORMAL LOW (ref 38.4–49.9)
HEMOGLOBIN: 11.6 g/dL — AB (ref 13.0–17.1)
LYMPH%: 26.9 % (ref 14.0–49.0)
MCH: 28.3 pg (ref 27.2–33.4)
MCHC: 31.9 g/dL — ABNORMAL LOW (ref 32.0–36.0)
MCV: 88.8 fL (ref 79.3–98.0)
MONO#: 0.3 10*3/uL (ref 0.1–0.9)
MONO%: 6.4 % (ref 0.0–14.0)
NEUT#: 3.4 10*3/uL (ref 1.5–6.5)
NEUT%: 65.9 % (ref 39.0–75.0)
Platelets: 138 10*3/uL — ABNORMAL LOW (ref 140–400)
RBC: 4.1 10*6/uL — ABNORMAL LOW (ref 4.20–5.82)
RDW: 16.2 % — AB (ref 11.0–14.6)
WBC: 5.2 10*3/uL (ref 4.0–10.3)
lymph#: 1.4 10*3/uL (ref 0.9–3.3)

## 2015-08-09 LAB — MAGNESIUM (CC13): Magnesium: 1.7 mg/dl (ref 1.5–2.5)

## 2015-08-09 MED ORDER — HEPARIN SOD (PORK) LOCK FLUSH 100 UNIT/ML IV SOLN
500.0000 [IU] | Freq: Once | INTRAVENOUS | Status: AC
  Administered 2015-08-09: 500 [IU] via INTRAVENOUS
  Filled 2015-08-09: qty 5

## 2015-08-09 MED ORDER — SODIUM CHLORIDE 0.9 % IV SOLN
Freq: Once | INTRAVENOUS | Status: AC
  Administered 2015-08-09: 10:00:00 via INTRAVENOUS

## 2015-08-09 MED ORDER — SODIUM CHLORIDE 0.9 % IJ SOLN
10.0000 mL | INTRAMUSCULAR | Status: DC | PRN
  Administered 2015-08-09: 10 mL via INTRAVENOUS
  Filled 2015-08-09: qty 10

## 2015-08-09 MED ORDER — SODIUM CHLORIDE 0.9 % IV SOLN
Freq: Once | INTRAVENOUS | Status: AC
  Administered 2015-08-09: 10:00:00 via INTRAVENOUS
  Filled 2015-08-09: qty 500

## 2015-08-09 NOTE — Patient Instructions (Signed)
Hypomagnesemia Magnesium is a common ion (mineral) in the body which is needed for metabolism. It is about how the body handles food and other chemical reactions necessary for life. Only about 2% of the magnesium in our body is found in the blood. When this is low, it is called hypomagnesemia. The blood will measure only a tiny amount of the magnesium in our body. When it is low in our blood, it does not mean that the whole body supply is low. The normal serum concentration ranges from 1.8-2.5 mEq/L. When the level gets to be less than 1.0 mEq/L, a number of problems begin to happen.  CAUSES   Receiving intravenous fluids without magnesium replacement.  Loss of magnesium from the bowel by nasogastric suction.  Loss of magnesium from nausea and vomiting or severe diarrhea. Any of the inflammatory bowel conditions can cause this.  Abuse of alcohol often leads to low serum magnesium.  An inherited form of magnesium loss happens when the kidneys lose magnesium. This is called familial or primary hypomagnesemia.  Some medications such as diuretics also cause the loss of magnesium. SYMPTOMS  These following problems are worse if the changes in magnesium levels come on suddenly.  Tremor.  Confusion.  Muscle weakness.  Oversensitive to sights and sounds.  Sensitive reflexes.  Depression.  Muscular fibrillations.  Overreactivity of the nerves.  Irritability.  Psychosis.  Spasms of the hand muscles.  Tetany (where the muscles go into uncontrollable spasms). DIAGNOSIS  This condition can be diagnosed by blood tests. TREATMENT   In an emergency, magnesium can be given intravenously (by vein).  If the condition is less worrisome, it can be corrected by diet. High levels of magnesium are found in green leafy vegetables, peas, beans, and nuts among other things. It can also be given through medications by mouth.  If it is being caused by medications, changes can be made.  If  alcohol is a problem, help is available if there are difficulties giving it up. Document Released: 09/03/2005 Document Revised: 04/24/2014 Document Reviewed: 07/28/2008 [employer] Patient Information 2015 [employer]. This information is not intended to replace advice given to you by your health care provider. Make sure you discuss any questions you have with your health care provider.  

## 2015-08-09 NOTE — Addendum Note (Signed)
Addended byArna Snipe on: 08/09/2015 10:04 AM   Modules accepted: Orders

## 2015-08-09 NOTE — Addendum Note (Signed)
Addended byArna Snipe on: 08/09/2015 11:51 AM   Modules accepted: Orders, SmartSet

## 2015-08-10 ENCOUNTER — Other Ambulatory Visit: Payer: Self-pay | Admitting: *Deleted

## 2015-08-10 DIAGNOSIS — D801 Nonfamilial hypogammaglobulinemia: Secondary | ICD-10-CM

## 2015-08-10 DIAGNOSIS — Z9489 Other transplanted organ and tissue status: Secondary | ICD-10-CM

## 2015-08-10 DIAGNOSIS — C9110 Chronic lymphocytic leukemia of B-cell type not having achieved remission: Secondary | ICD-10-CM

## 2015-08-10 MED ORDER — LIDOCAINE-PRILOCAINE 2.5-2.5 % EX CREA: TOPICAL_CREAM | CUTANEOUS | Status: DC

## 2015-08-13 ENCOUNTER — Other Ambulatory Visit: Payer: Self-pay | Admitting: *Deleted

## 2015-08-13 DIAGNOSIS — C9110 Chronic lymphocytic leukemia of B-cell type not having achieved remission: Secondary | ICD-10-CM

## 2015-08-13 DIAGNOSIS — D801 Nonfamilial hypogammaglobulinemia: Secondary | ICD-10-CM

## 2015-08-13 DIAGNOSIS — Z9489 Other transplanted organ and tissue status: Secondary | ICD-10-CM

## 2015-08-13 MED ORDER — LIDOCAINE-PRILOCAINE 2.5-2.5 % EX CREA: TOPICAL_CREAM | CUTANEOUS | Status: DC

## 2015-08-16 ENCOUNTER — Other Ambulatory Visit (HOSPITAL_BASED_OUTPATIENT_CLINIC_OR_DEPARTMENT_OTHER): Payer: BC Managed Care – PPO

## 2015-08-16 ENCOUNTER — Ambulatory Visit (HOSPITAL_BASED_OUTPATIENT_CLINIC_OR_DEPARTMENT_OTHER): Payer: BC Managed Care – PPO

## 2015-08-16 DIAGNOSIS — C9110 Chronic lymphocytic leukemia of B-cell type not having achieved remission: Secondary | ICD-10-CM | POA: Diagnosis not present

## 2015-08-16 LAB — CBC WITH DIFFERENTIAL/PLATELET
BASO%: 0.5 % (ref 0.0–2.0)
Basophils Absolute: 0 10*3/uL (ref 0.0–0.1)
EOS%: 0.5 % (ref 0.0–7.0)
Eosinophils Absolute: 0 10*3/uL (ref 0.0–0.5)
HCT: 38.1 % — ABNORMAL LOW (ref 38.4–49.9)
HEMOGLOBIN: 12.3 g/dL — AB (ref 13.0–17.1)
LYMPH%: 30.4 % (ref 14.0–49.0)
MCH: 28.4 pg (ref 27.2–33.4)
MCHC: 32.2 g/dL (ref 32.0–36.0)
MCV: 88.2 fL (ref 79.3–98.0)
MONO#: 0.4 10*3/uL (ref 0.1–0.9)
MONO%: 8.3 % (ref 0.0–14.0)
NEUT%: 60.3 % (ref 39.0–75.0)
NEUTROS ABS: 3.1 10*3/uL (ref 1.5–6.5)
PLATELETS: 145 10*3/uL (ref 140–400)
RBC: 4.32 10*6/uL (ref 4.20–5.82)
RDW: 16.3 % — AB (ref 11.0–14.6)
WBC: 5.2 10*3/uL (ref 4.0–10.3)
lymph#: 1.6 10*3/uL (ref 0.9–3.3)

## 2015-08-16 LAB — COMPREHENSIVE METABOLIC PANEL (CC13)
ALBUMIN: 3.5 g/dL (ref 3.5–5.0)
ALT: 42 U/L (ref 0–55)
AST: 30 U/L (ref 5–34)
Alkaline Phosphatase: 74 U/L (ref 40–150)
Anion Gap: 8 mEq/L (ref 3–11)
BILIRUBIN TOTAL: 0.27 mg/dL (ref 0.20–1.20)
BUN: 65.2 mg/dL — ABNORMAL HIGH (ref 7.0–26.0)
CO2: 14 meq/L — AB (ref 22–29)
CREATININE: 1.7 mg/dL — AB (ref 0.7–1.3)
Calcium: 8.6 mg/dL (ref 8.4–10.4)
Chloride: 120 mEq/L — ABNORMAL HIGH (ref 98–109)
EGFR: 39 mL/min/{1.73_m2} — ABNORMAL LOW (ref >90)
GLUCOSE: 118 mg/dL (ref 70–140)
Potassium: 5.7 mEq/L — ABNORMAL HIGH (ref 3.5–5.1)
SODIUM: 142 meq/L (ref 136–145)
TOTAL PROTEIN: 6.1 g/dL — AB (ref 6.4–8.3)

## 2015-08-16 LAB — MAGNESIUM (CC13): Magnesium: 2.3 mg/dl (ref 1.5–2.5)

## 2015-08-16 MED ORDER — HEPARIN SOD (PORK) LOCK FLUSH 100 UNIT/ML IV SOLN
250.0000 [IU] | Freq: Once | INTRAVENOUS | Status: DC | PRN
  Filled 2015-08-16: qty 5

## 2015-08-16 MED ORDER — HEPARIN SOD (PORK) LOCK FLUSH 100 UNIT/ML IV SOLN
500.0000 [IU] | Freq: Once | INTRAVENOUS | Status: AC | PRN
  Administered 2015-08-16: 500 [IU]
  Filled 2015-08-16: qty 5

## 2015-08-16 MED ORDER — SODIUM CHLORIDE 0.9 % IJ SOLN
10.0000 mL | Freq: Once | INTRAMUSCULAR | Status: AC
  Administered 2015-08-16: 10 mL via INTRAVENOUS
  Filled 2015-08-16: qty 10

## 2015-08-16 MED ORDER — SODIUM CHLORIDE 0.9 % IV SOLN: Freq: Once | INTRAVENOUS | Status: DC

## 2015-08-16 NOTE — Progress Notes (Signed)
MG level is 2.3 today. Spoke with Dr. Magrinat-received orders to hold IVF with magnesium today. Pharmacy made aware.  Informed pt that he will not need IVF today. He voiced understanding.  Pt. Requested dressing change to skin tear on right forearm.  This was done. Skin tear still raw, bleeding a little bit. Recovered with gauze and tegaderm.  Pt pleased with dressing change.

## 2015-08-22 ENCOUNTER — Other Ambulatory Visit: Payer: Self-pay | Admitting: Oncology

## 2015-08-23 ENCOUNTER — Ambulatory Visit (HOSPITAL_BASED_OUTPATIENT_CLINIC_OR_DEPARTMENT_OTHER): Payer: BC Managed Care – PPO | Admitting: Nurse Practitioner

## 2015-08-23 ENCOUNTER — Other Ambulatory Visit: Payer: Self-pay | Admitting: Nurse Practitioner

## 2015-08-23 ENCOUNTER — Encounter: Payer: Self-pay | Admitting: Nurse Practitioner

## 2015-08-23 ENCOUNTER — Other Ambulatory Visit (HOSPITAL_COMMUNITY)
Admission: RE | Admit: 2015-08-23 | Discharge: 2015-08-23 | Disposition: A | Discharge status: Home / Self Care | Payer: BC Managed Care – PPO | Source: Ambulatory Visit | Attending: Oncology | Admitting: Oncology

## 2015-08-23 ENCOUNTER — Other Ambulatory Visit (HOSPITAL_BASED_OUTPATIENT_CLINIC_OR_DEPARTMENT_OTHER): Payer: BC Managed Care – PPO

## 2015-08-23 ENCOUNTER — Telehealth: Payer: Self-pay | Admitting: *Deleted

## 2015-08-23 ENCOUNTER — Ambulatory Visit: Payer: Self-pay

## 2015-08-23 ENCOUNTER — Ambulatory Visit (HOSPITAL_BASED_OUTPATIENT_CLINIC_OR_DEPARTMENT_OTHER): Payer: BC Managed Care – PPO

## 2015-08-23 ENCOUNTER — Other Ambulatory Visit: Payer: Self-pay

## 2015-08-23 VITALS — BP 143/70 | HR 70 | Temp 97.0°F | Resp 20

## 2015-08-23 DIAGNOSIS — C9110 Chronic lymphocytic leukemia of B-cell type not having achieved remission: Secondary | ICD-10-CM

## 2015-08-23 DIAGNOSIS — R197 Diarrhea, unspecified: Secondary | ICD-10-CM

## 2015-08-23 DIAGNOSIS — S51811A Laceration without foreign body of right forearm, initial encounter: Secondary | ICD-10-CM

## 2015-08-23 DIAGNOSIS — E875 Hyperkalemia: Secondary | ICD-10-CM

## 2015-08-23 DIAGNOSIS — S51801A Unspecified open wound of right forearm, initial encounter: Secondary | ICD-10-CM

## 2015-08-23 DIAGNOSIS — N189 Chronic kidney disease, unspecified: Secondary | ICD-10-CM

## 2015-08-23 DIAGNOSIS — T8609 Other complications of bone marrow transplant: Secondary | ICD-10-CM

## 2015-08-23 DIAGNOSIS — D89811 Chronic graft-versus-host disease: Secondary | ICD-10-CM

## 2015-08-23 LAB — CBC WITH DIFFERENTIAL/PLATELET
BASO%: 0 % (ref 0.0–2.0)
BASOS ABS: 0 10*3/uL (ref 0.0–0.1)
EOS ABS: 0.1 10*3/uL (ref 0.0–0.5)
EOS%: 0.9 % (ref 0.0–7.0)
HCT: 37.5 % — ABNORMAL LOW (ref 38.4–49.9)
HEMOGLOBIN: 12 g/dL — AB (ref 13.0–17.1)
LYMPH%: 26.2 % (ref 14.0–49.0)
MCH: 28.7 pg (ref 27.2–33.4)
MCHC: 32 g/dL (ref 32.0–36.0)
MCV: 89.7 fL (ref 79.3–98.0)
MONO#: 0.4 10*3/uL (ref 0.1–0.9)
MONO%: 7.8 % (ref 0.0–14.0)
NEUT%: 65.1 % (ref 39.0–75.0)
NEUTROS ABS: 3.6 10*3/uL (ref 1.5–6.5)
PLATELETS: 116 10*3/uL — AB (ref 140–400)
RBC: 4.18 10*6/uL — ABNORMAL LOW (ref 4.20–5.82)
RDW: 17 % — AB (ref 11.0–14.6)
WBC: 5.5 10*3/uL (ref 4.0–10.3)
lymph#: 1.5 10*3/uL (ref 0.9–3.3)

## 2015-08-23 LAB — COMPREHENSIVE METABOLIC PANEL (CC13)
ALBUMIN: 3.5 g/dL (ref 3.5–5.0)
ALK PHOS: 82 U/L (ref 40–150)
ALT: 47 U/L (ref 0–55)
ANION GAP: 8 meq/L (ref 3–11)
AST: 36 U/L — ABNORMAL HIGH (ref 5–34)
BILIRUBIN TOTAL: 0.26 mg/dL (ref 0.20–1.20)
BUN: 62.8 mg/dL — ABNORMAL HIGH (ref 7.0–26.0)
CO2: 14 mEq/L — ABNORMAL LOW (ref 22–29)
Calcium: 8.5 mg/dL (ref 8.4–10.4)
Chloride: 119 mEq/L — ABNORMAL HIGH (ref 98–109)
Creatinine: 2 mg/dL — ABNORMAL HIGH (ref 0.7–1.3)
EGFR: 32 mL/min/{1.73_m2} — AB (ref >90)
Glucose: 139 mg/dl (ref 70–140)
Potassium: 6.1 mEq/L (ref 3.5–5.1)
Sodium: 141 mEq/L (ref 136–145)
TOTAL PROTEIN: 6 g/dL — AB (ref 6.4–8.3)

## 2015-08-23 LAB — MAGNESIUM (CC13): MAGNESIUM: 1.8 mg/dL (ref 1.5–2.5)

## 2015-08-23 MED ORDER — SODIUM BICARBONATE 8.4 % IV SOLN
Freq: Once | INTRAVENOUS | Status: AC
  Administered 2015-08-23: 11:00:00 via INTRAVENOUS
  Filled 2015-08-23: qty 100

## 2015-08-23 MED ORDER — SODIUM BICARBONATE 8.4 % IV SOLN: 50.0000 meq | Freq: Once | INTRAVENOUS | Status: DC

## 2015-08-23 MED ORDER — SODIUM CHLORIDE 0.9 % IJ SOLN
10.0000 mL | INTRAMUSCULAR | Status: DC | PRN
  Administered 2015-08-23: 10 mL via INTRAVENOUS
  Filled 2015-08-23: qty 10

## 2015-08-23 MED ORDER — HEPARIN SOD (PORK) LOCK FLUSH 100 UNIT/ML IV SOLN
500.0000 [IU] | Freq: Once | INTRAVENOUS | Status: AC
  Administered 2015-08-23: 500 [IU] via INTRAVENOUS
  Filled 2015-08-23: qty 5

## 2015-08-23 MED ORDER — HEPARIN SOD (PORK) LOCK FLUSH 100 UNIT/ML IV SOLN
500.0000 [IU] | Freq: Once | INTRAVENOUS | Status: DC | PRN
  Filled 2015-08-23: qty 5

## 2015-08-23 MED ORDER — SODIUM CHLORIDE 0.9 % IV SOLN
Freq: Once | INTRAVENOUS | Status: AC
  Administered 2015-08-23: 11:00:00 via INTRAVENOUS
  Filled 2015-08-23: qty 500

## 2015-08-23 NOTE — Progress Notes (Signed)
Wynona Canes from the lab called to inform us that patient's stool was too formed to run a c-diff.

## 2015-08-23 NOTE — Progress Notes (Signed)
Patient complains of a skin tear that has been present x 1 week. Skin tear noted to be covered with gauze and tegaderm. Odor noted when patient lifted his sleeve. Patient states the dressing has not been changed in 1 week due to fear of pulling more skin off. Selena Lesser, NP notified.

## 2015-08-23 NOTE — Telephone Encounter (Signed)
[facility] faxed Prior authorization request for Omeprazole 20 mg capsules.  Request to Managed Care for review.

## 2015-08-23 NOTE — Patient Instructions (Signed)
Hyperkalemia Hyperkalemia is when you have too much potassium in your blood. This can be a life-threatening condition. Potassium is normally removed (excreted) from the body by the kidneys. CAUSES  The potassium level in your body can become too high for the following reasons:  You take in too much potassium. You can do this by:  Using salt substitutes. They contain large amounts of potassium.  Taking potassium supplements from your caregiver. The dose may be too high for you.  Eating foods or taking nutritional products with potassium.  You excrete too little potassium. This can happen if:  Your kidneys are not functioning properly. Kidney (renal) disease is a very common cause of hyperkalemia.  You are taking medicines that lower your excretion of potassium, such as certain diuretic medicines.  You have an adrenal gland disease called Addison's disease.  You have a urinary tract obstruction, such as kidney stones.  You are on treatment to mechanically clean your blood (dialysis) and you skip a treatment.  You release a high amount of potassium from your cells into your blood. You may have a condition that causes potassium to move from your cells to your bloodstream. This can happen with:  Injury to muscles or other tissues. Most potassium is stored in the muscles.  Severe burns or infections.  Acidic blood plasma (acidosis). Acidosis can result from many diseases, such as uncontrolled diabetes. SYMPTOMS  Usually, there are no symptoms unless the potassium is dangerously high or has risen very quickly. Symptoms may include:  Irregular or very slow heartbeat.  Feeling sick to your stomach (nauseous).  Tiredness (fatigue).  Nerve problems such as tingling of the skin, numbness of the hands or feet, weakness, or paralysis. DIAGNOSIS  A simple blood test can measure the amount of potassium in your body. An electrocardiogram test of the heart can also help make the diagnosis.  The heart may beat dangerously fast or slow down and stop beating with severe hyperkalemia.  TREATMENT  Treatment depends on how bad the condition is and on the underlying cause.  If the hyperkalemia is an emergency (causing heart problems or paralysis), many different medicines can be used alone or together to lower the potassium level briefly. This may include an insulin injection even if you are not diabetic. Emergency dialysis may be needed to remove potassium from the body.  If the hyperkalemia is less severe or dangerous, the underlying cause is treated. This can include taking medicines if needed. Your prescription medicines may be changed. You may also need to take a medicine to help your body get rid of potassium. You may need to eat a diet low in potassium. HOME CARE INSTRUCTIONS   Take medicines and supplements as directed by your caregiver.  Do not take any over-the-counter medicines, supplements, natural products, herbs, or vitamins without reviewing them with your caregiver. Certain supplements and natural food products can have high amounts of potassium. Other products (such as ibuprofen) can damage weak kidneys and raise your potassium.  You may be asked to do repeat lab tests. Be sure to follow these directions.  If you have kidney disease, you may need to follow a low potassium diet. SEEK MEDICAL CARE IF:   You notice an irregular or very slow heartbeat.  You feel lightheaded.  You develop weakness that is unusual for you. SEEK IMMEDIATE MEDICAL CARE IF:   You have shortness of breath.  You have chest discomfort.  You pass out (faint). MAKE SURE YOU:   Understand   these instructions.  Will watch your condition.  Will get help right away if you are not doing well or get worse. Document Released: 11/28/2002 Document Revised: 03/01/2012 Document Reviewed: 03/15/2014 ExitCare Patient Information 2015 [employer]. This information is not intended to replace  advice given to you by your health care provider. Make sure you discuss any questions you have with your health care provider.  

## 2015-08-23 NOTE — Progress Notes (Signed)
Per [facility] outpatient pharm. Insurance will not cover omeprazole dr 20 mg capsule till January 28, 2016. Quanity limits. I will let heather know.

## 2015-08-25 LAB — WOUND CULTURE

## 2015-08-28 ENCOUNTER — Encounter: Payer: Self-pay | Admitting: Nurse Practitioner

## 2015-08-28 DIAGNOSIS — S51811A Laceration without foreign body of right forearm, initial encounter: Secondary | ICD-10-CM | POA: Insufficient documentation

## 2015-08-28 NOTE — Assessment & Plan Note (Signed)
Patient is status post bone marrow transplant on 07/12/2012.  He does suffer with chronic graft versus host disease in the form of chronic diarrhea; but is otherwise doing fairly well.  Patient continues to take prednisone 7.5 mg on a daily basis.  Patient has tried decreasing his prednisone; but then his chronic diarrhea worsens.  Patient also continues to receive magnesium infusions on an as-needed basis.  Patient last received denosumab injection on 03/29/2015.  Patient has plans to return for labs, visit, and IV fluid rehydration on 08/30/2015.

## 2015-08-28 NOTE — Assessment & Plan Note (Signed)
Potassium elevated to 6.1 today.  Patient denies taking any potassium supplements.  Patient will receive sodium bicarbonate intravenously today for management of his hyperkalemia.  Also, patient will restart sodium bicarbonate tablets once daily for hyperkalemia as well.

## 2015-08-28 NOTE — Assessment & Plan Note (Signed)
Patient suffers with chronic renal insufficiency.  Creatinine today was  2.0.  Will continue to monitor closely.

## 2015-08-28 NOTE — Assessment & Plan Note (Signed)
Patient takes chronic prednisone for treatment of his GVHD.  He has developed a skin tear to his right forearm within this past week.  Patient reports that a Tegaderm had been placed over the skin tear approximately one week ago; and does not appear to be healing well.  [place] infusion room nurse remove the Tegaderm dressing; and noted a foul odor from the wound.  After the wound was cleaned-it appears that there is a superficial skin tear to the right forearm.  Approximate 2 cm in length.  Once cleaned.-There was no foul odor.  No evidence of active infection to the forearm.  However, did obtain a wound culture of the right forearm skin tear.  Also, redressed the skin tear with Vaseline gauze dressing and Coban and wrap.  Advised patient to avoid any further Tegaderm to the site.  Also, advised patient to let us know if he develops any surrounding erythema, edema, warmth, tenderness, or red streaks.  Also advised to call/return to vertical to the emergency department if he develops any fevers or chills.

## 2015-08-28 NOTE — Assessment & Plan Note (Signed)
Patient continues with chronic diarrhea as his primary symptom of graft-versus-host disease following his bone marrow transplant in July 2013.  Patient states that he has been treated twice in the past for positive C. difficile results.  He reports feeling some crampy abdominal discomfort on occasion; and is requesting a recheck of his stool for C. difficile.  However, was informed that stool sample was too formed to obtain C. difficile sample.  Patient continues to take Imodium as directed for management of his diarrhea.  We'll continue to monitor closely.

## 2015-08-28 NOTE — Progress Notes (Signed)
SYMPTOM MANAGEMENT CLINIC   HPI: Timothy [name] 69 y.o. male diagnosed with chronic lymphocytic leukemia.  Patient is status post bone marrow transplant in July 2013.  Currently undergoing observation.  Patient has a history of chronic diarrhea; most likely secondary to chronic GVHD issues.  He has taken Imodium and Lomotil in the past.  He typically takes Questran on intermittent basis as well.    Patient takes chronic prednisone for treatment of his GVHD.  He has developed a skin tear to his right forearm within this past week.  Patient reports that a Tegaderm had been placed over the skin tear approximately one week ago; and does not appear to be healing well.  [place] infusion room nurse remove the Tegaderm dressing; and noted a foul odor from the wound.  Patient denies any recent fevers or chills.   Diarrhea     Review of Systems  Gastrointestinal: Positive for diarrhea.    Past Medical History  Diagnosis Date  . 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 Chronic graft-versus-host disease   . Diverticular disease   . Hyperlipidemia   . Obesity   . Hypertension   . Hiatal hernia   . CMV (cytomegalovirus) antibody positive     pre-transplant, with seroconversion x2 pst-transplant  . Right bundle branch block     pre-transplant  . CKD (chronic kidney disease) stage 2, GFR 60-89 ml/min   . Pancytopenia   . Atrial fibrillation     post-transplant  . Myopathy   . 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 Chronic graft-versus-host disease   . 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 Diarrhea in adult patient 12/05/2012    Due to active GVHD  . Rash of face 12/05/2012    Due to GVHD  . Hypomagnesemia 01/26/2013  . Left hip pain 12/01/2013  . Steroid-induced diabetes     novalog  . Leukemia, chronic lymphoid   . CLL (chronic lymphocytic leukemia) 12/05/2012    Dx 07/1999; started Rx 12/06  AlloBMT 3/13    Past Surgical History    Procedure Laterality Date  . Tonsillectomy and adenoidectomy    . Bone marrow transplant    . Flexible sigmoidoscopy  11/17/2012    Procedure: FLEXIBLE SIGMOIDOSCOPY;  Surgeon: Jeryl Columbia, MD;  Location: [facility];  Service: Endoscopy;  Laterality: N/A;  Dr Watt Climes states will be admitted to [facility] 1339 11/16/12  . Esophagogastroduodenoscopy  11/17/2012    Procedure: ESOPHAGOGASTRODUODENOSCOPY (EGD);  Surgeon: Jeryl Columbia, MD;  Location: [facility];  Service: Endoscopy;  Laterality: N/A;    has Transplant recipient; Diverticular disease; Hyperlipidemia; Obesity; Hypertension; Hiatal hernia; CMV (cytomegalovirus) antibody positive; Right bundle branch block; CKD (chronic kidney disease) stage 2, GFR 60-89 ml/min; Atrial fibrillation; Myopathy; Fine tremor; Immunocompromised; Chronic GVHD complicating bone marrow transplantation; CLL (chronic lymphocytic leukemia); Physical deconditioning; Chronic back pain; Hypomagnesemia; Other selective immunoglobulin deficiencies; Anemia, unspecified; Left hip pain; Steroid-induced diabetes mellitus; Right hip pain; Osteoporosis; Rash of neck; Creatinine elevation; Hyperkalemia; Chronic renal insufficiency; Fall; Diarrhea; Weakness; Peripheral edema; Hypogammaglobulinemia; and Skin tear of right forearm without complication on his problem list.    is allergic to benadryl.    Medication List       This list is accurate as of: 08/23/15 11:59 PM.  Always use your most recent med list.               acyclovir 400 MG tablet  Commonly known as:  ZOVIRAX  Take 2 tablets (800 mg total) by mouth 2 (two) times daily.     budesonide 3 MG 24 hr capsule  Commonly known as:  ENTOCORT EC  TAKE 1 CAPSULE BY MOUTH 3 TIMES DAILY     cholestyramine 4 G packet  Commonly known as:  QUESTRAN  Take 1 packet (4 g total) by mouth 2 (two) times daily.     diltiazem 240 MG 24 hr capsule  Commonly known as:  CARDIZEM CD  TAKE 1 CAPSULE BY MOUTH ONCE DAILY      fluconazole 100 MG tablet  Commonly known as:  DIFLUCAN  Take 1 tablet (100 mg total) by mouth once.     furosemide 40 MG tablet  Commonly known as:  LASIX  Take 1 tablet (40 mg total) by mouth 2 (two) times daily.     insulin aspart 100 unit/mL injection  Commonly known as:  novoLOG  Inject 12-20 Units into the skin. Sliding scale     Insulin Pen Needle 31G X 5 MM Misc  Commonly known as:  B-D UF III MINI PEN NEEDLES  Use five daily with insulin as directed.     labetalol 200 MG tablet  Commonly known as:  NORMODYNE  Take 2 tablets (400 mg total) by mouth 2 (two) times daily.     lidocaine-prilocaine cream  Commonly known as:  EMLA  Apply topically to port-a-cath 1-2 hours prior to each port access.     lisinopril 10 MG tablet  Commonly known as:  PRINIVIL,ZESTRIL  Take 10 mg by mouth daily.     loratadine 10 MG tablet  Commonly known as:  CLARITIN  Take 10 mg by mouth daily.     NOVOLOG FLEXPEN 100 UNIT/ML FlexPen  Generic drug:  insulin aspart     omeprazole 20 MG capsule  Commonly known as:  PRILOSEC  Take 1 capsule (20 mg total) by mouth daily.     ONE TOUCH ULTRA TEST test strip  Generic drug:  glucose blood  TEST BEFORE MEALS AND AT BEDTIME.     ONETOUCH DELICA LANCETS 92E Misc  USE TO TEST BEFORE MEALS AND AT BEDTIME     predniSONE 5 MG tablet  Commonly known as:  DELTASONE  Take 1 tablet (5 mg total) by mouth daily with breakfast. Pt is to alternate days with 7.5 mg  and 5 mg.     sertraline 50 MG tablet  Commonly known as:  ZOLOFT  Take 1 tablet (50 mg total) by mouth every morning.     sulfamethoxazole-trimethoprim 800-160 MG per tablet  Commonly known as:  BACTRIM DS,SEPTRA DS  Take 1 tablet by mouth daily.     tacrolimus 0.5 MG capsule  Commonly known as:  PROGRAF  TAKE 3 CAPSULES BY MOUTH TWICE DAILY     TRESIBA FLEXTOUCH 200 UNIT/ML Sopn  Generic drug:  Insulin Degludec  50 Units daily.     VOLTAREN 1 % Gel  Generic drug:  diclofenac  sodium  Apply 2 g topically 2 (two) times daily. Applied to back         PHYSICAL EXAMINATION  142/81, HR 78, temp 98.2  Physical Exam  Constitutional: He is oriented to person, place, and time. Vital signs are normal. He appears unhealthy.  Patient appears weak, frail, and chronically ill.  HENT:  Head: Normocephalic and atraumatic.  Mouth/Throat: Oropharynx is clear and moist.  Eyes: Conjunctivae and EOM are normal. Pupils are equal, round, and reactive to light. Right eye  exhibits no discharge. Left eye exhibits no discharge. No scleral icterus.  Neck: Normal range of motion.  Pulmonary/Chest: Effort normal and breath sounds normal. No respiratory distress.  Musculoskeletal: Normal range of motion. He exhibits edema. He exhibits no tenderness.  Bilat +3 edema to feet.   Neurological: He is alert and oriented to person, place, and time.  Skin: Skin is warm and dry. No rash noted. No erythema. No pallor.  Approximate 2 cm in length superficial skin tear to the right forearm.  After wound was cleared; there was no foul odor.  Also, there was no surrounding erythema, edema, warmth, tenderness, or red streaks.  No evidence of active infection to site.  Psychiatric: Affect normal.  Nursing note and vitals reviewed.   LABORATORY DATA:. Appointment on 08/23/2015  Component Date Value Ref Range Status  . Culture, Wound 08/23/2015 Culture, Wound   Final   Comment: Final - ===== GRAM STAIN: ===== Few WBC present-predominately PMN No Squamous Epithelial Cells Seen Rare Gram Positive Cocci In Pairs Rare Gram Variable Rods Multiple Organisms Present,None Predominant  ------------------------------------------------------------------------ No Staphylococcus aureus isolated NO GROUP A STREP (S. PYOGENES) ISOLATED   Appointment on 08/23/2015  Component Date Value Ref Range Status  . WBC 08/23/2015 5.5  4.0 - 10.3 10e3/uL Final  . NEUT# 08/23/2015 3.6  1.5 - 6.5 10e3/uL Final  . HGB  08/23/2015 12.0* 13.0 - 17.1 g/dL Final  . HCT 08/23/2015 37.5* 38.4 - 49.9 % Final  . Platelets 08/23/2015 116* 140 - 400 10e3/uL Final  . MCV 08/23/2015 89.7  79.3 - 98.0 fL Final  . MCH 08/23/2015 28.7  27.2 - 33.4 pg Final  . MCHC 08/23/2015 32.0  32.0 - 36.0 g/dL Final  . RBC 08/23/2015 4.18* 4.20 - 5.82 10e6/uL Final  . RDW 08/23/2015 17.0* 11.0 - 14.6 % Final  . lymph# 08/23/2015 1.5  0.9 - 3.3 10e3/uL Final  . MONO# 08/23/2015 0.4  0.1 - 0.9 10e3/uL Final  . Eosinophils Absolute 08/23/2015 0.1  0.0 - 0.5 10e3/uL Final  . Basophils Absolute 08/23/2015 0.0  0.0 - 0.1 10e3/uL Final  . NEUT% 08/23/2015 65.1  39.0 - 75.0 % Final  . LYMPH% 08/23/2015 26.2  14.0 - 49.0 % Final  . MONO% 08/23/2015 7.8  0.0 - 14.0 % Final  . EOS% 08/23/2015 0.9  0.0 - 7.0 % Final  . BASO% 08/23/2015 0.0  0.0 - 2.0 % Final  . Sodium 08/23/2015 141  136 - 145 mEq/L Final  . Potassium 08/23/2015 6.1* 3.5 - 5.1 mEq/L Final  . Chloride 08/23/2015 119* 98 - 109 mEq/L Final  . CO2 08/23/2015 14* 22 - 29 mEq/L Final  . Glucose 08/23/2015 139  70 - 140 mg/dl Final  . BUN 08/23/2015 62.8* 7.0 - 26.0 mg/dL Final  . Creatinine 08/23/2015 2.0* 0.7 - 1.3 mg/dL Final  . Total Bilirubin 08/23/2015 0.26  0.20 - 1.20 mg/dL Final  . Alkaline Phosphatase 08/23/2015 82  40 - 150 U/L Final  . AST 08/23/2015 36* 5 - 34 U/L Final  . ALT 08/23/2015 47  0 - 55 U/L Final  . Total Protein 08/23/2015 6.0* 6.4 - 8.3 g/dL Final  . Albumin 08/23/2015 3.5  3.5 - 5.0 g/dL Final  . Calcium 08/23/2015 8.5  8.4 - 10.4 mg/dL Final  . Anion Gap 08/23/2015 8  3 - 11 mEq/L Final  . EGFR 08/23/2015 32* >90 ml/min/1.73 m2 Final   eGFR is calculated using the CKD-EPI Creatinine Equation (2009)  .  Magnesium 08/23/2015 1.8  1.5 - 2.5 mg/dl Final   Right forearm photo:  Media Information   File: [id number]      File Link    Scan on 08/23/2015 11:23 AM by Susanne Borders, NP : Right forearmScan on 08/23/2015 11:23 AM by Susanne Borders, NP : Right forearm    Key Information    Document ID File Type Document Type Description   [id number] Image Photos Right forearm    Import Information    Attached At Date Time User Dept   Encounter Level 08/23/2015 11:23 AM Susanne Borders, NP [facility]    Encounter    Office Visit on 08/23/15 with Susanne Borders, NP      RADIOGRAPHIC STUDIES: No results found.  ASSESSMENT/PLAN:    Chronic renal insufficiency Patient suffers with chronic renal insufficiency.  Creatinine today was  2.0.  Will continue to monitor closely.    CLL (chronic lymphocytic leukemia) Patient is status post bone marrow transplant on 07/12/2012.  He does suffer with chronic graft versus host disease in the form of chronic diarrhea; but is otherwise doing fairly well.  Patient continues to take prednisone 7.5 mg on a daily basis.  Patient has tried decreasing his prednisone; but then his chronic diarrhea worsens.  Patient also continues to receive magnesium infusions on an as-needed basis.  Patient last received denosumab injection on 03/29/2015.  Patient has plans to return for labs, visit, and IV fluid rehydration on 08/30/2015.         Diarrhea Patient continues with chronic diarrhea as his primary symptom of graft-versus-host disease following his bone marrow transplant in July 2013.  Patient states that he has been treated twice in the past for positive C. difficile results.  He reports feeling some crampy abdominal discomfort on occasion; and is requesting a recheck of his stool for C. difficile.  However, was informed that stool sample was too formed to obtain C. difficile sample.  Patient continues to take Imodium as directed for management of his diarrhea.  We'll continue to monitor closely.    Hyperkalemia Potassium elevated to 6.1 today.  Patient denies taking any potassium supplements.  Patient will receive sodium bicarbonate intravenously today for management of  his hyperkalemia.  Also, patient will restart sodium bicarbonate tablets once daily for hyperkalemia as well.  Skin tear of right forearm without complication Patient takes chronic prednisone for treatment of his GVHD.  He has developed a skin tear to his right forearm within this past week.  Patient reports that a Tegaderm had been placed over the skin tear approximately one week ago; and does not appear to be healing well.  [place] infusion room nurse remove the Tegaderm dressing; and noted a foul odor from the wound.  After the wound was cleaned-it appears that there is a superficial skin tear to the right forearm.  Approximate 2 cm in length.  Once cleaned.-There was no foul odor.  No evidence of active infection to the forearm.  However, did obtain a wound culture of the right forearm skin tear.  Also, redressed the skin tear with Vaseline gauze dressing and Coban and wrap.  Advised patient to avoid any further Tegaderm to the site.  Also, advised patient to let us know if he develops any surrounding erythema, edema, warmth, tenderness, or red streaks.  Also advised to call/return to vertical to the emergency department if he develops any fevers or chills.   Patient stated understanding of all instructions;  and was in agreement with this plan of care. The patient knows to call the clinic with any problems, questions or concerns.   Review/collaboration with Dr. Jana Hakim regarding all aspects of patient's visit today.   Total time spent with patient was 25 minutes;  with greater than 75 percent of that time spent in face to face counseling regarding patient's symptoms,  and coordination of care and follow up.  Disclaimer: This note was dictated with voice recognition software. Similar sounding words can inadvertently be transcribed and may not be corrected upon review.   Drue Second, NP 08/28/2015

## 2015-08-30 ENCOUNTER — Encounter: Payer: Self-pay | Admitting: Nurse Practitioner

## 2015-08-30 ENCOUNTER — Ambulatory Visit (HOSPITAL_BASED_OUTPATIENT_CLINIC_OR_DEPARTMENT_OTHER): Payer: BC Managed Care – PPO | Admitting: Nurse Practitioner

## 2015-08-30 ENCOUNTER — Ambulatory Visit (HOSPITAL_BASED_OUTPATIENT_CLINIC_OR_DEPARTMENT_OTHER): Payer: BC Managed Care – PPO

## 2015-08-30 ENCOUNTER — Other Ambulatory Visit (HOSPITAL_BASED_OUTPATIENT_CLINIC_OR_DEPARTMENT_OTHER): Payer: BC Managed Care – PPO

## 2015-08-30 DIAGNOSIS — C9110 Chronic lymphocytic leukemia of B-cell type not having achieved remission: Secondary | ICD-10-CM

## 2015-08-30 DIAGNOSIS — D89811 Chronic graft-versus-host disease: Secondary | ICD-10-CM

## 2015-08-30 DIAGNOSIS — T8601 Bone marrow transplant rejection: Secondary | ICD-10-CM

## 2015-08-30 DIAGNOSIS — D801 Nonfamilial hypogammaglobulinemia: Secondary | ICD-10-CM

## 2015-08-30 LAB — CBC WITH DIFFERENTIAL/PLATELET
BASO%: 0.2 % (ref 0.0–2.0)
Basophils Absolute: 0 10*3/uL (ref 0.0–0.1)
EOS%: 0.7 % (ref 0.0–7.0)
Eosinophils Absolute: 0 10*3/uL (ref 0.0–0.5)
HCT: 37 % — ABNORMAL LOW (ref 38.4–49.9)
HEMOGLOBIN: 11.7 g/dL — AB (ref 13.0–17.1)
LYMPH%: 27.9 % (ref 14.0–49.0)
MCH: 28.6 pg (ref 27.2–33.4)
MCHC: 31.6 g/dL — ABNORMAL LOW (ref 32.0–36.0)
MCV: 90.5 fL (ref 79.3–98.0)
MONO#: 0.4 10*3/uL (ref 0.1–0.9)
MONO%: 7.3 % (ref 0.0–14.0)
NEUT%: 63.9 % (ref 39.0–75.0)
NEUTROS ABS: 3.6 10*3/uL (ref 1.5–6.5)
Platelets: 122 10*3/uL — ABNORMAL LOW (ref 140–400)
RBC: 4.09 10*6/uL — AB (ref 4.20–5.82)
RDW: 17.6 % — AB (ref 11.0–14.6)
WBC: 5.6 10*3/uL (ref 4.0–10.3)
lymph#: 1.6 10*3/uL (ref 0.9–3.3)

## 2015-08-30 LAB — COMPREHENSIVE METABOLIC PANEL (CC13)
ALBUMIN: 3.5 g/dL (ref 3.5–5.0)
ALK PHOS: 77 U/L (ref 40–150)
ALT: 52 U/L (ref 0–55)
AST: 39 U/L — ABNORMAL HIGH (ref 5–34)
Anion Gap: 8 mEq/L (ref 3–11)
BUN: 59.1 mg/dL — ABNORMAL HIGH (ref 7.0–26.0)
CALCIUM: 8.9 mg/dL (ref 8.4–10.4)
CO2: 16 mEq/L — ABNORMAL LOW (ref 22–29)
Chloride: 118 mEq/L — ABNORMAL HIGH (ref 98–109)
Creatinine: 1.7 mg/dL — ABNORMAL HIGH (ref 0.7–1.3)
EGFR: 40 mL/min/{1.73_m2} — AB (ref >90)
GLUCOSE: 69 mg/dL — AB (ref 70–140)
POTASSIUM: 5.4 meq/L — AB (ref 3.5–5.1)
SODIUM: 142 meq/L (ref 136–145)
Total Bilirubin: 0.41 mg/dL (ref 0.20–1.20)
Total Protein: 6 g/dL — ABNORMAL LOW (ref 6.4–8.3)

## 2015-08-30 LAB — MAGNESIUM (CC13): MAGNESIUM: 2 mg/dL (ref 1.5–2.5)

## 2015-08-30 MED ORDER — SODIUM CHLORIDE 0.9 % IV SOLN
Freq: Once | INTRAVENOUS | Status: AC
  Administered 2015-08-30: 11:00:00 via INTRAVENOUS

## 2015-08-30 MED ORDER — SODIUM CHLORIDE 0.9 % IV SOLN
Freq: Once | INTRAVENOUS | Status: AC
  Administered 2015-08-30: 11:00:00 via INTRAVENOUS
  Filled 2015-08-30: qty 500

## 2015-08-30 MED ORDER — HEPARIN SOD (PORK) LOCK FLUSH 100 UNIT/ML IV SOLN
500.0000 [IU] | Freq: Once | INTRAVENOUS | Status: AC | PRN
  Administered 2015-08-30: 500 [IU]
  Filled 2015-08-30: qty 5

## 2015-08-30 MED ORDER — SODIUM CHLORIDE 0.9 % IJ SOLN
10.0000 mL | INTRAMUSCULAR | Status: DC | PRN
  Administered 2015-08-30: 10 mL via INTRAVENOUS
  Filled 2015-08-30: qty 10

## 2015-08-30 NOTE — Patient Instructions (Signed)
Hypomagnesemia Magnesium is a common ion (mineral) in the body which is needed for metabolism. It is about how the body handles food and other chemical reactions necessary for life. Only about 2% of the magnesium in our body is found in the blood. When this is low, it is called hypomagnesemia. The blood will measure only a tiny amount of the magnesium in our body. When it is low in our blood, it does not mean that the whole body supply is low. The normal serum concentration ranges from 1.8-2.5 mEq/L. When the level gets to be less than 1.0 mEq/L, a number of problems begin to happen.  CAUSES   Receiving intravenous fluids without magnesium replacement.  Loss of magnesium from the bowel by nasogastric suction.  Loss of magnesium from nausea and vomiting or severe diarrhea. Any of the inflammatory bowel conditions can cause this.  Abuse of alcohol often leads to low serum magnesium.  An inherited form of magnesium loss happens when the kidneys lose magnesium. This is called familial or primary hypomagnesemia.  Some medications such as diuretics also cause the loss of magnesium. SYMPTOMS  These following problems are worse if the changes in magnesium levels come on suddenly.  Tremor.  Confusion.  Muscle weakness.  Oversensitive to sights and sounds.  Sensitive reflexes.  Depression.  Muscular fibrillations.  Overreactivity of the nerves.  Irritability.  Psychosis.  Spasms of the hand muscles.  Tetany (where the muscles go into uncontrollable spasms). DIAGNOSIS  This condition can be diagnosed by blood tests. TREATMENT   In an emergency, magnesium can be given intravenously (by vein).  If the condition is less worrisome, it can be corrected by diet. High levels of magnesium are found in green leafy vegetables, peas, beans, and nuts among other things. It can also be given through medications by mouth.  If it is being caused by medications, changes can be made.  If  alcohol is a problem, help is available if there are difficulties giving it up. Document Released: 09/03/2005 Document Revised: 04/24/2014 Document Reviewed: 07/28/2008 [employer] Patient Information 2015 [employer]. This information is not intended to replace advice given to you by your health care provider. Make sure you discuss any questions you have with your health care provider.  

## 2015-08-30 NOTE — Progress Notes (Signed)
ID: Timothy [name]   DOB: 07-31-1946  MR#: [id number]  DEY#:[id number]  DJS:HFWY, [name], MD SU: OTHER MD: [name]; [name], MD; [name], [name],MD; [name], MD; [facility] in [place], [place]:  [name], RN [phone]), [name] M.D.  CHIEF COMPLAINT:  CLL, status post allogeneic stem cell transplant, GVHD CURRENT TREATMENT: Immunosuppression, supportive care  HISTORY OF CLL: From the original intake note:  We have very complete records from Dr. Racheal Patches in [place], and in summary:  The patient was initially diagnosed in August 2000, with a white cell count of 23,600, but normal hemoglobin and platelets, and no significant symptomatology. Over the next several years his white cell count drifted up, and he eventually developed some symptoms of night sweats in particular, leading to treatment with fludarabine, Cytoxan and rituxan for five cycles given between December 2006 and May 2007.  We have CT scans from June 2006, November 2006 and April 2007, and comparing the November 2006 and April 2007 scans, there was near complete response. He had subsequent therapy in [place] as detailed below, but with decreased response, leading to allogeneic stem-cell transplant at the [facility] 02/24/2012.  Subsequent history is as detailed below.  INTERVAL HISTORY: Kayceon returns today for follow up of his chronic lymphoid leukemia and graft-versus-host disease accompanied his wife, [name]. He was seen in the infusion room, because he arrived late.  He is due for IV fluids and magnesium today.  REVIEW OF SYSTEMS: Rayan has no new complaints to offer. He continues on prednisone 7.$RemoveBeforeD'5mg'bvfINVcUyoLizi$  daily. He has noticed more cramping lately, but his stools were not liquid enough to be tested for c.diff. He has recently had his lasix increased to $RemoveBefo'40mg'NhgWgUPDGfh$  BID and has restarted bicarbonate to 1 tablet daily. His blood sugars are  well controlled and his ANC is down to 5.9. He has not been entirely compliant with his compression socks, but his swelling is down some anyway. Hedenies fevers, chills, unexplained weight loss, or excessive fatigue.  A detailed review of systems is otherwise stable.  PAST MEDICAL HISTORY: Past Medical History  Diagnosis Date  . 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 Chronic graft-versus-host disease   . Diverticular disease   . Hyperlipidemia   . Obesity   . Hypertension   . Hiatal hernia   . CMV (cytomegalovirus) antibody positive     pre-transplant, with seroconversion x2 pst-transplant  . Right bundle branch block     pre-transplant  . CKD (chronic kidney disease) stage 2, GFR 60-89 ml/min   . Pancytopenia   . Atrial fibrillation     post-transplant  . Myopathy   . 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 Chronic graft-versus-host disease   . 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 Diarrhea in adult patient 12/05/2012    Due to active GVHD  . Rash of face 12/05/2012    Due to GVHD  . Hypomagnesemia 01/26/2013  . Left hip pain 12/01/2013  . Steroid-induced diabetes     novalog  . Leukemia, chronic lymphoid   . CLL (chronic lymphocytic leukemia) 12/05/2012    Dx 07/1999; started Rx 12/06  AlloBMT 3/13    PAST SURGICAL HISTORY: Past Surgical History  Procedure Laterality Date  . Tonsillectomy and adenoidectomy    . Bone marrow transplant    . Flexible sigmoidoscopy  11/17/2012    Procedure: FLEXIBLE SIGMOIDOSCOPY;  Surgeon: Jeryl Columbia, MD;  Location: [facility];  Service: Endoscopy;  Laterality: N/A;  Dr Watt Climes states will be admitted to rooom 1339 11/16/12  . Esophagogastroduodenoscopy  11/17/2012    Procedure: ESOPHAGOGASTRODUODENOSCOPY (EGD);  Surgeon: Jeryl Columbia, MD;  Location: [facility];  Service: Endoscopy;  Laterality: N/A;    FAMILY HISTORY Family History  Problem Relation Age of Onset  . Cancer Father   The patient's  father died from complications of chronic lymphocytic leukemia at the age of 38.  It had been diagnosed seven years before when he was 45.  The patient is enrolled in a familial chronic lymphocytic leukemia study out of the [employer].  The patient's mother is 60, alive, unfortunately suffering with dementia, and he has a brother, 46, who is otherwise in fair health.   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  His wife of >40 years, [name], is a homemaker.  Their daughter, [name], lives in [place].  She also is a Agricultural engineer.  The patient has an 1 year old grandson and an 52-year-old granddaughter, and that is really the main reason he moved to this area.  He is a Tourist information centre manager.     ADVANCED DIRECTIVES: In place  HEALTH MAINTENANCE: (Updated 04/13/2014) Social History  Substance Use Topics  . Smoking status: Never Smoker   . Smokeless tobacco: Never Used  . Alcohol Use: No     Colonoscopy: Nov 2013, Dr. Watt Climes  PSA: Not on file  Bone density:  Feb 2014;  Patient also has known insufficiency and pathologic fractures in addition to his long-standing history of steroid use.  Lipid panel: Jan 2015, elevated    Allergies  Allergen Reactions  . Benadryl [Diphenhydramine Hcl]     "Restless leg syndrome"    Current Outpatient Prescriptions  Medication Sig Dispense Refill  . acyclovir (ZOVIRAX) 400 MG tablet Take 2 tablets (800 mg total) by mouth 2 (two) times daily. 120 tablet 1  . budesonide (ENTOCORT EC) 3 MG 24 hr capsule TAKE 1 CAPSULE BY MOUTH 3 TIMES DAILY 90 capsule 6  . cholestyramine (QUESTRAN) 4 G packet Take 1 packet (4 g total) by mouth 2 (two) times daily. 60 each 12  . diltiazem (CARDIZEM CD) 240 MG 24 hr capsule TAKE 1 CAPSULE BY MOUTH ONCE  DAILY 30 capsule 3  . fluconazole (DIFLUCAN) 100 MG tablet Take 1 tablet (100 mg total) by mouth once. 30 tablet 3  . furosemide (LASIX) 40 MG tablet Take 1 tablet (40 mg total) by mouth 2 (two) times daily. 60 tablet 2  . insulin aspart (NOVOLOG) 100 unit/mL injection Inject 12-20 Units into the skin. Sliding scale    . Insulin Pen Needle (B-D UF III MINI PEN NEEDLES) 31G X 5 MM MISC Use five daily with insulin as directed. 100 each 5  . labetalol (NORMODYNE) 200 MG tablet Take 2 tablets (400 mg total) by mouth 2 (two) times daily. 120 tablet 1  . lidocaine-prilocaine (EMLA) cream Apply topically to port-a-cath 1-2 hours prior to each port access. 30 g 3  . lisinopril (PRINIVIL,ZESTRIL) 10 MG tablet Take 10 mg by mouth daily.     Marland Kitchen loratadine (CLARITIN) 10 MG tablet Take 10 mg by mouth daily.    Marland Kitchen NOVOLOG FLEXPEN 100 UNIT/ML FlexPen     . omeprazole (PRILOSEC) 20 MG capsule Take 1 capsule (  20 mg total) by mouth daily. 30 capsule 5  . ONE TOUCH ULTRA TEST test strip TEST BEFORE MEALS AND AT BEDTIME. 300 each 2  . ONETOUCH DELICA LANCETS 33G MISC USE TO TEST BEFORE MEALS AND AT BEDTIME 300 each 0  . predniSONE (DELTASONE) 5 MG tablet Take 1 tablet (5 mg total) by mouth daily with breakfast. Pt is to alternate days with 7.5 mg  and 5 mg. 60 tablet 2  . sertraline (ZOLOFT) 50 MG tablet Take 1 tablet (50 mg total) by mouth every morning. 90 tablet 5  . sulfamethoxazole-trimethoprim (BACTRIM DS,SEPTRA DS) 800-160 MG per tablet Take 1 tablet by mouth daily. 30 tablet 5  . tacrolimus (PROGRAF) 0.5 MG capsule TAKE 3 CAPSULES BY MOUTH TWICE DAILY 180 capsule 4  . TRESIBA FLEXTOUCH 200 UNIT/ML SOPN 50 Units daily.     . VOLTAREN 1 % GEL Apply 2 g topically 2 (two) times daily. Applied to back     No current facility-administered medications for this visit.   Facility-Administered Medications Ordered in Other Visits  Medication Dose Route Frequency Provider Last Rate Last Dose  . 0.9 %  sodium chloride  infusion   Intravenous Continuous Amy G Berry, PA-C 500 mL/hr at 03/12/13 0900    . 0.9 %  sodium chloride infusion   Intravenous Once Lowella Dell, MD      . heparin lock flush 100 unit/mL  500 Units Intracatheter Once PRN Lowella Dell, MD      . sodium chloride 0.9 % 500 mL with magnesium sulfate 1 g infusion   Intravenous Once Sheffield Slider, NP      . sodium chloride 0.9 % injection 10 mL  10 mL Intravenous PRN Lowella Dell, MD   10 mL at 08/11/12 1606    OBJECTIVE: Middle-aged white man in no acute distress  There were no vitals filed for this visit.There is no weight on file to calculate BMI.  ECOG: 2 There were no vitals filed for this visit.  Skin: warm, dry, chronic purpura lesions HEENT: sclerae anicteric, conjunctivae pink, oropharynx clear. No thrush or mucositis.  Lymph Nodes: No cervical or supraclavicular lymphadenopathy  Lungs: clear to auscultation bilaterally, no rales, wheezes, or rhonci  Heart: regular rate and rhythm  Abdomen: round, soft, non tender, positive bowel sounds  Musculoskeletal: No focal spinal tenderness, +2 bilateral lower extremity edema Neuro: non focal, well oriented, positive affect   LABS:  CBC    Component Value Date/Time   WBC 5.6 08/30/2015 0920   WBC 3.2* 02/27/2015 0805   RBC 4.09* 08/30/2015 0920   RBC 3.64* 02/27/2015 0805   RBC 3.82* 03/16/2013 1400   HGB 11.7* 08/30/2015 0920   HGB 11.3* 02/27/2015 0805   HCT 37.0* 08/30/2015 0920   HCT 34.1* 02/27/2015 0805   PLT 122* 08/30/2015 0920   PLT 120* 02/27/2015 0805   MCV 90.5 08/30/2015 0920   MCV 93.7 02/27/2015 0805   MCH 28.6 08/30/2015 0920   MCH 31.0 02/27/2015 0805   MCHC 31.6* 08/30/2015 0920   MCHC 33.1 02/27/2015 0805   RDW 17.6* 08/30/2015 0920   RDW 16.2* 02/27/2015 0805   LYMPHSABS 1.6 08/30/2015 0920   LYMPHSABS 1.4 03/18/2013 0615   MONOABS 0.4 08/30/2015 0920   MONOABS 0.3 03/18/2013 0615   EOSABS 0.0 08/30/2015 0920   EOSABS 0.0 03/18/2013  0615   BASOSABS 0.0 08/30/2015 0920   BASOSABS 0.0 03/18/2013 0615       Chemistry  Component Value Date/Time   NA 142 08/30/2015 0921   NA 139 04/05/2015 0911   K 5.4* 08/30/2015 0921   K 4.6 04/05/2015 0911   CL 110 04/05/2015 0911   CL 101 06/15/2013 1034   CO2 16* 08/30/2015 0921   CO2 22 04/05/2015 0911   BUN 59.1* 08/30/2015 0921   BUN 45* 04/05/2015 0911   CREATININE 1.7* 08/30/2015 0921   CREATININE 1.23 04/05/2015 0911      Component Value Date/Time   CALCIUM 8.9 08/30/2015 0921   CALCIUM 8.4 04/05/2015 0911   ALKPHOS 77 08/30/2015 0921   ALKPHOS 108 04/05/2015 0911   AST 39* 08/30/2015 0921   AST 50* 04/05/2015 0911   ALT 52 08/30/2015 0921   ALT 59* 04/05/2015 0911   BILITOT 0.41 08/30/2015 0921   BILITOT 0.5 04/05/2015 0911     STUDIES: No results found.   ASSESSMENT: 69 y.o. [place] man with a history of well-differentiated lymphocytic lymphoma/ chronic lymphoid leukemia initially diagnosed in 2000, not requiring intervention until 2006; with multiple chromosomal abnormalities.  His treatment history is as follows:  (1) fludarabine/cyclophosphamide/rituximab x5 completed May 2007.   (2) rituximab for 8 doses October 2010, with partial response   (3) Leustatin and ofatumumab weekly x8 July to September 2011 followed by maintenance ofatumumab  every 2 months, with initial response but rising counts September 2012   (4) status-post unrelated donor stem-cell transplant 02/24/2012 at the [facility]  (a) conditioning regimen consisted of fludarabine + TBI at 200 cGy, followed by rituximab x27;  (b) CMV reactivation x3 (patient CMV positive, donor negative), s/p ganciclovir treatment; 3d reactivation August 2013, s/p gancyclovir, with negative PCR mid-September 2013; last gancyclovir dose 10/06/2012 (c) Chronic GVHD: involving gut and skin, treated with steroids, tacrolimus and MMF.  MMF was eventually d/c'd and tacrolimus currently at a dose of 1.$RemoveB'5mg'WFXmPoYZ$   BID (d) atrial fibrillation: resolved on brief amiodarone regimen (e) steroid-induced myopathy: improving  (f) hypomagnesemia: improved after d/c gancyclovir, needs continuing support (g) hypogammaglobulinemia: requiring IVIG most recently 08/03/2014. (h) history of elevated triglycerides (606 on 07/14/2012)  (i) adrenal insufficiency: on prednisone and budesonide (j) pancytopenia,resolved (k) brief episode of neutropenia ([place] 300) February 2015, accompanied by diarrhea; resolved   (5) restaging studies February-March 2015 including CT scans, flow cytometry, and bone marrow biopsy, showed no evidence of residual chronic lymphoid leukemia.  (a) repeat bone marrow biopsy 02/27/2015 showed no evidence of chronic lymphoid leukemia and also no dyspoiesis. Flow cytometry showed no B cells.  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 Status post kyphoplasty at L3-4 in June 2014. Also with evidence of rib fractures and insufficiency fractures bilaterally of the sacral  alae, noted by CT in March 2015. --   Denosumab started 12/08/2013, given as prolia Q6 months which is what has been approved by his insurance, most recent dose 03/29/2015  (12) chronic back pain and hip pain controlled with OxyContin and hydrocodone/APAP.  (12) nausea: well controlled on current meds  (13)  Positive c.diff, 03/08/2013, on Flagyl 500 mg TID x 20 days, then on oral vanco  with Questran, showing  improvement; positive when repeated April 2014; Negative x 3 since then  (14) persistently increased BUN and potassium  (15)  Hypertension, on labetalol, cardizem, lisinopril, and furosemide; managed by Dr. Brigitte Pulse  (16) steroid induced hyperglycemia/ DM II: managed by Dr Brigitte Pulse   (17) hypogammaglobulinemia-- requiring intermittent supplementation, most recent dose 05/10/2015  (18) squamous cell CA in situ removed from left parietal scalp October 2014  PLAN: The labs were reviewed in detail and saw improvements in his creatinine back down to 1.7 and his potassium is down to 5.4. His magnesium is stable at 2.0. He wonders if he must continue with fluid and magnesium infusions every 2 weeks, or if this could be stretched further since he is stable. He will discuss this with Dr. Jana Hakim at his next visit.   We will continue on the 7.$Remove'5mg'uuoonTQ$  prednisone, as our recent attempt to alternate with $RemoveBefore'5mg'QhHlQqEDVAgrr$  days was unsuccessful.   Baudelio will return in 2 weeks for his next follow up visit. He understands and agrees with this plan. He has been encouraged to call with any issues that might arise before his next visit here.   Laurie Panda, NP 08/30/2015

## 2015-08-30 NOTE — Addendum Note (Signed)
Addended by: [name] on: 08/30/2015 11:01 AM   Modules accepted: Medications

## 2015-09-03 ENCOUNTER — Ambulatory Visit: Payer: Self-pay

## 2015-09-05 ENCOUNTER — Other Ambulatory Visit (HOSPITAL_BASED_OUTPATIENT_CLINIC_OR_DEPARTMENT_OTHER): Payer: BC Managed Care – PPO

## 2015-09-05 ENCOUNTER — Other Ambulatory Visit: Payer: Self-pay | Admitting: Oncology

## 2015-09-05 ENCOUNTER — Ambulatory Visit (HOSPITAL_BASED_OUTPATIENT_CLINIC_OR_DEPARTMENT_OTHER): Payer: BC Managed Care – PPO

## 2015-09-05 DIAGNOSIS — C9110 Chronic lymphocytic leukemia of B-cell type not having achieved remission: Secondary | ICD-10-CM

## 2015-09-05 LAB — COMPREHENSIVE METABOLIC PANEL (CC13)
ALBUMIN: 3.5 g/dL (ref 3.5–5.0)
ALK PHOS: 90 U/L (ref 40–150)
ALT: 54 U/L (ref 0–55)
ANION GAP: 9 meq/L (ref 3–11)
AST: 47 U/L — ABNORMAL HIGH (ref 5–34)
BILIRUBIN TOTAL: 0.27 mg/dL (ref 0.20–1.20)
BUN: 67.6 mg/dL — ABNORMAL HIGH (ref 7.0–26.0)
CALCIUM: 8.6 mg/dL (ref 8.4–10.4)
CO2: 18 mEq/L — ABNORMAL LOW (ref 22–29)
Chloride: 116 mEq/L — ABNORMAL HIGH (ref 98–109)
Creatinine: 2.4 mg/dL — ABNORMAL HIGH (ref 0.7–1.3)
EGFR: 27 mL/min/{1.73_m2} — AB (ref >90)
GLUCOSE: 135 mg/dL (ref 70–140)
Potassium: 5.2 mEq/L — ABNORMAL HIGH (ref 3.5–5.1)
SODIUM: 142 meq/L (ref 136–145)
TOTAL PROTEIN: 5.9 g/dL — AB (ref 6.4–8.3)

## 2015-09-05 LAB — CBC WITH DIFFERENTIAL/PLATELET
BASO%: 0.2 % (ref 0.0–2.0)
BASOS ABS: 0 10*3/uL (ref 0.0–0.1)
EOS ABS: 0 10*3/uL (ref 0.0–0.5)
EOS%: 0.2 % (ref 0.0–7.0)
HEMATOCRIT: 35.7 % — AB (ref 38.4–49.9)
HEMOGLOBIN: 11.5 g/dL — AB (ref 13.0–17.1)
LYMPH#: 1.9 10*3/uL (ref 0.9–3.3)
LYMPH%: 30.9 % (ref 14.0–49.0)
MCH: 29 pg (ref 27.2–33.4)
MCHC: 32.2 g/dL (ref 32.0–36.0)
MCV: 89.9 fL (ref 79.3–98.0)
MONO#: 0.4 10*3/uL (ref 0.1–0.9)
MONO%: 6.7 % (ref 0.0–14.0)
NEUT#: 3.7 10*3/uL (ref 1.5–6.5)
NEUT%: 62 % (ref 39.0–75.0)
PLATELETS: 123 10*3/uL — AB (ref 140–400)
RBC: 3.97 10*6/uL — ABNORMAL LOW (ref 4.20–5.82)
RDW: 17.6 % — AB (ref 11.0–14.6)
WBC: 6 10*3/uL (ref 4.0–10.3)

## 2015-09-05 LAB — MAGNESIUM (CC13): MAGNESIUM: 1.7 mg/dL (ref 1.5–2.5)

## 2015-09-05 MED ORDER — MAGNESIUM SULFATE 50 % IJ SOLN
Freq: Once | INTRAMUSCULAR | Status: AC
  Administered 2015-09-05: 15:00:00 via INTRAVENOUS
  Filled 2015-09-05: qty 500

## 2015-09-05 MED ORDER — HEPARIN SOD (PORK) LOCK FLUSH 100 UNIT/ML IV SOLN
500.0000 [IU] | Freq: Once | INTRAVENOUS | Status: AC | PRN
  Administered 2015-09-05: 500 [IU]
  Filled 2015-09-05: qty 5

## 2015-09-05 MED ORDER — SODIUM CHLORIDE 0.9 % IJ SOLN
10.0000 mL | INTRAMUSCULAR | Status: AC | PRN
  Administered 2015-09-05: 10 mL
  Filled 2015-09-05: qty 10

## 2015-09-05 MED ORDER — HEPARIN SOD (PORK) LOCK FLUSH 100 UNIT/ML IV SOLN
500.0000 [IU] | INTRAVENOUS | Status: DC | PRN
  Filled 2015-09-05: qty 5

## 2015-09-05 MED ORDER — SODIUM CHLORIDE 0.9 % IV SOLN
INTRAVENOUS | Status: DC
  Administered 2015-09-05: 15:00:00 via INTRAVENOUS

## 2015-09-05 NOTE — Patient Instructions (Signed)
Hypomagnesemia Magnesium is a common ion (mineral) in the body which is needed for metabolism. It is about how the body handles food and other chemical reactions necessary for life. Only about 2% of the magnesium in our body is found in the blood. When this is low, it is called hypomagnesemia. The blood will measure only a tiny amount of the magnesium in our body. When it is low in our blood, it does not mean that the whole body supply is low. The normal serum concentration ranges from 1.8-2.5 mEq/L. When the level gets to be less than 1.0 mEq/L, a number of problems begin to happen.  CAUSES   Receiving intravenous fluids without magnesium replacement.  Loss of magnesium from the bowel by nasogastric suction.  Loss of magnesium from nausea and vomiting or severe diarrhea. Any of the inflammatory bowel conditions can cause this.  Abuse of alcohol often leads to low serum magnesium.  An inherited form of magnesium loss happens when the kidneys lose magnesium. This is called familial or primary hypomagnesemia.  Some medications such as diuretics also cause the loss of magnesium. SYMPTOMS  These following problems are worse if the changes in magnesium levels come on suddenly.  Tremor.  Confusion.  Muscle weakness.  Oversensitive to sights and sounds.  Sensitive reflexes.  Depression.  Muscular fibrillations.  Overreactivity of the nerves.  Irritability.  Psychosis.  Spasms of the hand muscles.  Tetany (where the muscles go into uncontrollable spasms). DIAGNOSIS  This condition can be diagnosed by blood tests. TREATMENT   In an emergency, magnesium can be given intravenously (by vein).  If the condition is less worrisome, it can be corrected by diet. High levels of magnesium are found in green leafy vegetables, peas, beans, and nuts among other things. It can also be given through medications by mouth.  If it is being caused by medications, changes can be made.  If  alcohol is a problem, help is available if there are difficulties giving it up. Document Released: 09/03/2005 Document Revised: 04/24/2014 Document Reviewed: 07/28/2008 [employer] Patient Information 2015 [employer]. This information is not intended to replace advice given to you by your health care provider. Make sure you discuss any questions you have with your health care provider.  

## 2015-09-05 NOTE — Progress Notes (Signed)
Dr. Jana Hakim notified by phone of abnormal labs from today.

## 2015-09-07 ENCOUNTER — Ambulatory Visit: Payer: Self-pay

## 2015-09-10 ENCOUNTER — Telehealth: Payer: Self-pay | Admitting: Oncology

## 2015-09-10 NOTE — Telephone Encounter (Signed)
Returned patients call and we moved his tx to the afternoon of 9/21 per his req   anne

## 2015-09-11 ENCOUNTER — Other Ambulatory Visit: Payer: Self-pay | Admitting: Nurse Practitioner

## 2015-09-11 ENCOUNTER — Other Ambulatory Visit: Payer: Self-pay

## 2015-09-11 DIAGNOSIS — C9110 Chronic lymphocytic leukemia of B-cell type not having achieved remission: Secondary | ICD-10-CM

## 2015-09-12 ENCOUNTER — Ambulatory Visit: Payer: Self-pay

## 2015-09-12 ENCOUNTER — Ambulatory Visit (HOSPITAL_BASED_OUTPATIENT_CLINIC_OR_DEPARTMENT_OTHER): Payer: BC Managed Care – PPO | Admitting: Oncology

## 2015-09-12 ENCOUNTER — Telehealth: Payer: Self-pay | Admitting: Oncology

## 2015-09-12 ENCOUNTER — Ambulatory Visit (HOSPITAL_BASED_OUTPATIENT_CLINIC_OR_DEPARTMENT_OTHER): Payer: BC Managed Care – PPO

## 2015-09-12 ENCOUNTER — Other Ambulatory Visit (HOSPITAL_BASED_OUTPATIENT_CLINIC_OR_DEPARTMENT_OTHER): Payer: BC Managed Care – PPO

## 2015-09-12 VITALS — BP 133/65 | HR 68 | Temp 98.1°F | Resp 18 | Ht 66.0 in | Wt 218.6 lb

## 2015-09-12 DIAGNOSIS — Z9489 Other transplanted organ and tissue status: Secondary | ICD-10-CM | POA: Diagnosis not present

## 2015-09-12 DIAGNOSIS — C9110 Chronic lymphocytic leukemia of B-cell type not having achieved remission: Secondary | ICD-10-CM

## 2015-09-12 DIAGNOSIS — D89811 Chronic graft-versus-host disease: Secondary | ICD-10-CM | POA: Diagnosis not present

## 2015-09-12 DIAGNOSIS — T8609 Other complications of bone marrow transplant: Secondary | ICD-10-CM

## 2015-09-12 LAB — COMPREHENSIVE METABOLIC PANEL (CC13)
ALBUMIN: 3.6 g/dL (ref 3.5–5.0)
ALK PHOS: 85 U/L (ref 40–150)
ALT: 51 U/L (ref 0–55)
ANION GAP: 10 meq/L (ref 3–11)
AST: 38 U/L — ABNORMAL HIGH (ref 5–34)
BILIRUBIN TOTAL: 0.33 mg/dL (ref 0.20–1.20)
BUN: 80 mg/dL — ABNORMAL HIGH (ref 7.0–26.0)
CO2: 19 mEq/L — ABNORMAL LOW (ref 22–29)
Calcium: 9.2 mg/dL (ref 8.4–10.4)
Chloride: 115 mEq/L — ABNORMAL HIGH (ref 98–109)
Creatinine: 2.3 mg/dL — ABNORMAL HIGH (ref 0.7–1.3)
EGFR: 28 mL/min/{1.73_m2} — AB (ref >90)
Glucose: 195 mg/dl — ABNORMAL HIGH (ref 70–140)
POTASSIUM: 5.5 meq/L — AB (ref 3.5–5.1)
Sodium: 143 mEq/L (ref 136–145)
TOTAL PROTEIN: 5.9 g/dL — AB (ref 6.4–8.3)

## 2015-09-12 LAB — CBC WITH DIFFERENTIAL/PLATELET
BASO%: 0 % (ref 0.0–2.0)
BASOS ABS: 0 10*3/uL (ref 0.0–0.1)
EOS ABS: 0 10*3/uL (ref 0.0–0.5)
EOS%: 0.2 % (ref 0.0–7.0)
HCT: 37.3 % — ABNORMAL LOW (ref 38.4–49.9)
HEMOGLOBIN: 11.9 g/dL — AB (ref 13.0–17.1)
LYMPH%: 26.8 % (ref 14.0–49.0)
MCH: 28.8 pg (ref 27.2–33.4)
MCHC: 31.9 g/dL — ABNORMAL LOW (ref 32.0–36.0)
MCV: 90.3 fL (ref 79.3–98.0)
MONO#: 0.3 10*3/uL (ref 0.1–0.9)
MONO%: 4.8 % (ref 0.0–14.0)
NEUT%: 68.2 % (ref 39.0–75.0)
NEUTROS ABS: 3.9 10*3/uL (ref 1.5–6.5)
PLATELETS: 129 10*3/uL — AB (ref 140–400)
RBC: 4.13 10*6/uL — ABNORMAL LOW (ref 4.20–5.82)
RDW: 17.2 % — AB (ref 11.0–14.6)
WBC: 5.6 10*3/uL (ref 4.0–10.3)
lymph#: 1.5 10*3/uL (ref 0.9–3.3)

## 2015-09-12 LAB — MAGNESIUM (CC13): MAGNESIUM: 1.7 mg/dL (ref 1.5–2.5)

## 2015-09-12 MED ORDER — SODIUM CHLORIDE 0.9 % IJ SOLN
10.0000 mL | INTRAMUSCULAR | Status: DC | PRN
  Administered 2015-09-12: 10 mL via INTRAVENOUS
  Filled 2015-09-12: qty 10

## 2015-09-12 MED ORDER — SODIUM CHLORIDE 0.9 % IV SOLN
INTRAVENOUS | Status: DC
  Administered 2015-09-12: 15:00:00 via INTRAVENOUS

## 2015-09-12 MED ORDER — HEPARIN SOD (PORK) LOCK FLUSH 100 UNIT/ML IV SOLN
500.0000 [IU] | Freq: Once | INTRAVENOUS | Status: AC
  Administered 2015-09-12: 500 [IU] via INTRAVENOUS
  Filled 2015-09-12: qty 5

## 2015-09-12 MED ORDER — FUROSEMIDE 40 MG PO TABS: 80.0000 mg | ORAL_TABLET | Freq: Two times a day (BID) | ORAL | Status: DC

## 2015-09-12 MED ORDER — SODIUM CHLORIDE 0.9 % IV SOLN
Freq: Once | INTRAVENOUS | Status: AC
  Administered 2015-09-12: 15:00:00 via INTRAVENOUS
  Filled 2015-09-12: qty 500

## 2015-09-12 MED ORDER — PSEUDOEPHEDRINE HCL 60 MG PO TABS: 60.0000 mg | ORAL_TABLET | ORAL | Status: DC | PRN

## 2015-09-12 NOTE — Patient Instructions (Signed)
Magnesium This is a blood test which measures the amount of magnesium in your blood. Most of the magnesium in your body exists in your cells. However, the magnesium in your blood is important for many processes. It is important for your nerves to be able to conduct electrical energy. This is important in heart patients with higher heartbeats. When your heart beats and then gets ready to beat again, it repolarizes. If the magnesium levels are low or high, it can affect the accuracy of the repolarization process. The magnesium levels are also monitored during pregnancy. This is done to determine if the expectant mother may have preeclampsia or toxemia. Magnesium is often used for treatment of these problems. PREPARATION FOR TEST No preparation or fasting is needed. A blood sample may be taken by inserting a needle into a vein in the arm.  NORMAL FINDINGS  Adult: 1.3 to 2.1 mEq/L or 0.65 to 1.05 mmol/L (SI units)  Child: 1.4 to 1.7 mEq/L  Newborn: 1.4 to 2 mEq/L Possible critical values: less than 0.5 mEq/L or greater than 3 mEq/L Ranges for normal findings may vary among different laboratories and hospitals. You should always check with your caregiver after having lab work or other tests done to discuss the meaning of your test results and whether your values are considered within normal limits. MEANING OF TEST  Your caregiver will go over the test results with you. Your caregiver will discuss the importance and meaning of your results. He or she will also discuss treatment options and additional tests, if needed. OBTAINING THE TEST RESULTS It is your responsibility to obtain your test results. Ask the lab or department performing the test when and how you will get your results. Document Released: 01/10/2005 Document Revised: 03/01/2012 Document Reviewed: 11/18/2008 [employer] Patient Information 2015 [employer]. This information is not intended to replace advice given to you by your health care  provider. Make sure you discuss any questions you have with your health care provider.  

## 2015-09-12 NOTE — Telephone Encounter (Signed)
Appointments made and avs will be printed this afternoon when patient returns

## 2015-09-12 NOTE — Progress Notes (Signed)
ID: Timothy [name]   DOB: 1946/09/23  MR#: [id number]  NKN#:[id number]  FXT:KWIO, [name], MD SU: OTHER MD: [name]; [name], MD; [name], [name],MD; [name], MD; [facility] in [place], [place]:  [name], RN [phone]), [name] M.D.  CHIEF COMPLAINT:  CLL, status post allogeneic stem cell transplant, GVHD CURRENT TREATMENT: Immunosuppression, magnesium, fluids, supportive care  HISTORY OF CLL: From the original intake note:  We have very complete records from Dr. Racheal Patches in [place], and in summary:  The patient was initially diagnosed in August 2000, with a white cell count of 23,600, but normal hemoglobin and platelets, and no significant symptomatology. Over the next several years his white cell count drifted up, and he eventually developed some symptoms of night sweats in particular, leading to treatment with fludarabine, Cytoxan and rituxan for five cycles given between December 2006 and May 2007.  We have CT scans from June 2006, November 2006 and April 2007, and comparing the November 2006 and April 2007 scans, there was near complete response. He had subsequent therapy in [place] as detailed below, but with decreased response, leading to allogeneic stem-cell transplant at the [facility] 02/24/2012.  Subsequent history is as detailed below.  INTERVAL HISTORY: Jonel returns today for follow up of his chronic lymphoid leukemia and graft-versus-host disease accompanied his wife, [name]. He receives weekly infusions of fluids with magnesium. We skipped one week and has magnesium level was still good so we are thinking of going to every 2 weeks. We are continuing to monitor his graft-versus-host disease clinically. He had 4 bowel movements yesterday, one in the middle of the night, although what will come up was the need to urinate. He has had no accidents recently. He has noted no rash, no  mouth sores or thrush. He is continuing on prednisone 5 mg in the morning and 2. 5 in the evening, and he tells me his most recent hemoglobin A1c was 5.9.  REVIEW OF SYSTEMS: Ksean took 3600 steps yesterday. He only took 1000 steps a day before. He is not formally doing exercises for his clots at present. He is tolerating the bicarbonate dose in the morning with added worsening his diarrhea. It has been helping his potassium level. He is working closely with the diabetes coordinator and Dr. Trena Platt office and sees her about every 3 months. A detailed review of systems today was otherwise stable  PAST MEDICAL HISTORY: Past Medical History  Diagnosis Date  . 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 Chronic graft-versus-host disease   . Diverticular disease   . Hyperlipidemia   . Obesity   . Hypertension   . Hiatal hernia   . CMV (cytomegalovirus) antibody positive     pre-transplant, with seroconversion x2 pst-transplant  . Right bundle branch block     pre-transplant  . CKD (chronic kidney disease) stage 2, GFR 60-89 ml/min   . Pancytopenia   . Atrial fibrillation     post-transplant  . Myopathy   . 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 Chronic graft-versus-host disease   . 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 Diarrhea in adult patient 12/05/2012    Due to active GVHD  . Rash of face 12/05/2012    Due to GVHD  . Hypomagnesemia 01/26/2013  . Left hip pain 12/01/2013  . Steroid-induced diabetes     novalog  . Leukemia, chronic lymphoid   . CLL (chronic lymphocytic leukemia)  12/05/2012    Dx 07/1999; started Rx 12/06  AlloBMT 3/13    PAST SURGICAL HISTORY: Past Surgical History  Procedure Laterality Date  . Tonsillectomy and adenoidectomy    . Bone marrow transplant    . Flexible sigmoidoscopy  11/17/2012    Procedure: FLEXIBLE SIGMOIDOSCOPY;  Surgeon: Jeryl Columbia, MD;  Location: [facility];  Service: Endoscopy;  Laterality: N/A;  Dr Watt Climes  states will be admitted to rooom 1339 11/16/12  . Esophagogastroduodenoscopy  11/17/2012    Procedure: ESOPHAGOGASTRODUODENOSCOPY (EGD);  Surgeon: Jeryl Columbia, MD;  Location: [facility];  Service: Endoscopy;  Laterality: N/A;    FAMILY HISTORY Family History  Problem Relation Age of Onset  . Cancer Father   The patient's father died from complications of chronic lymphocytic leukemia at the age of 69.  It had been diagnosed seven years before when he was 51.  The patient is enrolled in a familial chronic lymphocytic leukemia study out of the [employer].  The patient's mother is 69, alive, unfortunately suffering with dementia, and he has a brother, 70, who is otherwise in fair health.   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  His wife of >40 years, [name], is a homemaker.  Their daughter, [name], lives in [place].  She also is a Agricultural engineer.  The patient has an 65 year old grandson and an 34-year-old granddaughter, and that is really the main reason he moved to this area.  He is a Tourist information centre manager.     ADVANCED DIRECTIVES: In place  HEALTH MAINTENANCE: (Updated 04/13/2014) Social History  Substance Use Topics  . Smoking status: Never Smoker   . Smokeless tobacco: Never Used  . Alcohol Use: No     Colonoscopy: Nov 2013, Dr. Watt Climes  PSA: Not on file  Bone density:  Feb 2014;  Patient also has known insufficiency and pathologic fractures in addition to his long-standing history of steroid use.  Lipid panel: Jan 2015, elevated    Allergies  Allergen Reactions  . Benadryl [Diphenhydramine Hcl]     "Restless leg syndrome"    Current Outpatient Prescriptions  Medication Sig Dispense Refill  . acyclovir (ZOVIRAX) 400 MG tablet TAKE 2 TABLETS BY MOUTH  TWICE DAILY 120 tablet 1  . budesonide (ENTOCORT EC) 3 MG 24 hr capsule TAKE 1 CAPSULE BY MOUTH 3 TIMES DAILY 90 capsule 6  . cholestyramine (QUESTRAN) 4 G packet Take 1 packet (4 g total) by mouth 2 (two) times daily. 60 each 12  . diltiazem (CARDIZEM CD) 240 MG 24 hr capsule TAKE 1 CAPSULE BY MOUTH ONCE DAILY 30 capsule 3  . fluconazole (DIFLUCAN) 100 MG tablet Take 1 tablet (100 mg total) by mouth once. 30 tablet 3  . furosemide (LASIX) 40 MG tablet Take 1 tablet (40 mg total) by mouth 2 (two) times daily. 60 tablet 2  . insulin aspart (NOVOLOG) 100 unit/mL injection Inject 12-20 Units into the skin. Sliding scale    . Insulin Pen Needle (B-D UF III MINI PEN NEEDLES) 31G X 5 MM MISC Use five daily with insulin as directed. 100 each 5  . labetalol (NORMODYNE) 200 MG tablet Take 2 tablets (400 mg total) by mouth 2 (two) times daily. 120 tablet 1  . lidocaine-prilocaine (EMLA) cream Apply topically to port-a-cath 1-2 hours prior to  each port access. 30 g 3  . lisinopril (PRINIVIL,ZESTRIL) 10 MG tablet Take 10 mg by mouth daily.     Marland Kitchen loratadine (CLARITIN) 10 MG tablet Take 10 mg by mouth daily.    Marland Kitchen NOVOLOG FLEXPEN 100 UNIT/ML FlexPen     . omeprazole (PRILOSEC) 20 MG capsule Take 1 capsule (20 mg total) by mouth daily. 30 capsule 5  . ONE TOUCH ULTRA TEST test strip TEST BEFORE MEALS AND AT BEDTIME. 300 each 2  . ONETOUCH DELICA LANCETS 26Z MISC USE TO TEST BEFORE MEALS AND AT BEDTIME 300 each 0  . predniSONE (DELTASONE) 5 MG tablet Take 1 tablet (5 mg total) by mouth daily with breakfast. Pt is to alternate days with 7.5 mg  and 5 mg. 60 tablet 2  . sertraline (ZOLOFT) 50 MG tablet Take 1 tablet (50 mg total) by mouth every morning. 90 tablet 5  . sodium bicarbonate 650 MG tablet Take 650 mg by mouth daily.    Marland Kitchen sulfamethoxazole-trimethoprim (BACTRIM DS,SEPTRA DS) 800-160 MG per tablet Take 1 tablet by mouth daily. 30 tablet 5  . tacrolimus (PROGRAF) 0.5 MG capsule TAKE 3 CAPSULES BY MOUTH  TWICE DAILY 180 capsule 4  . TRESIBA FLEXTOUCH 200 UNIT/ML SOPN 50 Units daily.     . VOLTAREN 1 % GEL Apply 2 g topically 2 (two) times daily. Applied to back     No current facility-administered medications for this visit.   Facility-Administered Medications Ordered in Other Visits  Medication Dose Route Frequency Provider Last Rate Last Dose  . 0.9 %  sodium chloride infusion   Intravenous Continuous Amy G Berry, PA-C 500 mL/hr at 03/12/13 0900    . sodium chloride 0.9 % injection 10 mL  10 mL Intravenous PRN Chauncey Cruel, MD   10 mL at 08/11/12 1606    OBJECTIVE: Middle-aged white man who appears stated age 59 Vitals:   09/12/15 0842  BP: 133/65  Pulse: 68  Temp: 98.1 F (36.7 C)  Resp: 18  Body mass index is 35.3 kg/(m^2).  ECOG: 1 Filed Weights   09/12/15 0842  Weight: 218 lb 9.6 oz (99.156 kg)    Sclerae unicteric, pupils round and equal Oropharynx clear and moist-- no thrush or other lesions No cervical or supraclavicular adenopathy Lungs no rales or rhonchi Heart regular rate and rhythm Abd soft, obese, nontender, positive bowel sounds MSK no focal spinal tenderness, bilateral grade 1 ankle edema Neuro: nonfocal, well oriented, appropriate affect Skin multiple ecchymosis but no rash suggestive of graft-versus-host disease   LABS:  CBC    Component Value Date/Time   WBC 5.6 09/12/2015 0823   WBC 3.2* 02/27/2015 0805   RBC 4.13* 09/12/2015 0823   RBC 3.64* 02/27/2015 0805   RBC 3.82* 03/16/2013 1400   HGB 11.9* 09/12/2015 0823   HGB 11.3* 02/27/2015 0805   HCT 37.3* 09/12/2015 0823   HCT 34.1* 02/27/2015 0805   PLT 129* 09/12/2015 0823   PLT 120* 02/27/2015 0805   MCV 90.3 09/12/2015 0823   MCV 93.7 02/27/2015 0805   MCH 28.8 09/12/2015 0823   MCH 31.0 02/27/2015 0805   MCHC 31.9* 09/12/2015 0823   MCHC 33.1 02/27/2015 0805   RDW 17.2* 09/12/2015 0823   RDW 16.2* 02/27/2015 0805   LYMPHSABS 1.5 09/12/2015 0823   LYMPHSABS 1.4 03/18/2013 0615    MONOABS 0.3 09/12/2015 0823   MONOABS 0.3 03/18/2013 0615   EOSABS 0.0 09/12/2015 0823   EOSABS 0.0 03/18/2013 0615   BASOSABS  0.0 09/12/2015 0823   BASOSABS 0.0 03/18/2013 0615       Chemistry      Component Value Date/Time   NA 142 09/05/2015 1420   NA 139 04/05/2015 0911   K 5.2* 09/05/2015 1420   K 4.6 04/05/2015 0911   CL 110 04/05/2015 0911   CL 101 06/15/2013 1034   CO2 18* 09/05/2015 1420   CO2 22 04/05/2015 0911   BUN 67.6* 09/05/2015 1420   BUN 45* 04/05/2015 0911   CREATININE 2.4* 09/05/2015 1420   CREATININE 1.23 04/05/2015 0911      Component Value Date/Time   CALCIUM 8.6 09/05/2015 1420   CALCIUM 8.4 04/05/2015 0911   ALKPHOS 90 09/05/2015 1420   ALKPHOS 108 04/05/2015 0911   AST 47* 09/05/2015 1420   AST 50* 04/05/2015 0911   ALT 54 09/05/2015 1420   ALT 59* 04/05/2015 0911   BILITOT 0.27 09/05/2015 1420   BILITOT 0.5 04/05/2015 0911     STUDIES: No results found.   ASSESSMENT: 69 y.o. [place] man with a history of well-differentiated lymphocytic lymphoma/ chronic lymphoid leukemia initially diagnosed in 2000, not requiring intervention until 2006; with multiple chromosomal abnormalities.  His treatment history is as follows:  (1) fludarabine/cyclophosphamide/rituximab x5 completed May 2007.   (2) rituximab for 8 doses October 2010, with partial response   (3) Leustatin and ofatumumab weekly x8 July to September 2011 followed by maintenance ofatumumab  every 2 months, with initial response but rising counts September 2012   (4) status-post unrelated donor stem-cell transplant 02/24/2012 at [facility]  (a) conditioning regimen consisted of fludarabine + TBI at 200 cGy, followed by rituximab x27;  (b) CMV reactivation x3 (patient CMV positive, donor negative), s/p ganciclovir treatment; 3d reactivation August 2013, s/p gancyclovir, with negative PCR mid-September 2013; last gancyclovir dose 10/06/2012 (c) Chronic GVHD: involving gut and skin,  treated with steroids, tacrolimus and MMF.  MMF was eventually d/c'd and tacrolimus currently at a dose of 1.49m BID (d) atrial fibrillation: resolved on brief amiodarone regimen (e) steroid-induced myopathy: improving  (f) hypomagnesemia: improved after d/c gancyclovir, needs continuing support (g) hypogammaglobulinemia: requiring IVIG most recently 08/03/2014. (h) history of elevated triglycerides (606 on 07/14/2012)  (i) adrenal insufficiency: on prednisone and budesonide (j) pancytopenia,resolved (k) brief episode of neutropenia ([id number]) February 2015, accompanied by diarrhea; resolved   (5) restaging studies February-March 2015 including CT scans, flow cytometry, and bone marrow biopsy, showed no evidence of residual chronic lymphoid leukemia.  (a) repeat bone marrow biopsy 02/27/2015 showed no evidence of chronic lymphoid leukemia and also no dyspoiesis. Flow cytometry showed no B cells.  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 Status post kyphoplasty at L3-4 in June 2014. Also with evidence of rib fractures and insufficiency fractures bilaterally of the sacral  alae, noted by CT in March 2015. --   Denosumab started 12/08/2013, given as prolia Q6 months which is what has been approved by his insurance, most recent dose 03/29/2015  (12) chronic back pain and hip pain controlled with OxyContin and hydrocodone/APAP.  (12) nausea: well controlled  on current  meds  (13)  Positive c.diff, 03/08/2013, on Flagyl 500 mg TID x 20 days, then on oral vanco with Questran, showing improvement; positive when repeated April 2014; Negative x 3 since then  (14) persistently increased BUN and potassium  (15)  Hypertension, on labetalol, cardizem, lisinopril, and furosemide; managed by Dr. Brigitte Pulse  (16) steroid induced hyperglycemia/ DM II: managed by Dr Brigitte Pulse   (17) hypogammaglobulinemia-- requiring intermittent supplementation, most recent dose 05/10/2015  (18) squamous cell CA in situ removed from left parietal scalp October 2014  PLAN: I am not picking up any signs or symptoms of worsening graft versus host disease, which is always good news.  His diarrhea is controlled with 2 Imodium in the morning and one in the evening. He does not use the question as much as I would like. I again encouraged him to use it twice a day.  He does need to exercise more. The goal is 5000 steps a day. He also needs to work on the as standing from a sitting position without pushing off, which will strengthen his clots.  I given clearance to get his teeth cleaned. He should be pretreated with amoxicillin the day of teeth cleaning just in case.  His ankle swelling is much better on the current dose of Lasix and he is able to tolerate the bicarbonate dose in the morning, which is helping his potassium levels and not causing him worsening diarrhea.  He is now 17 years out from his original diagnosis of CLL. This is very favorable.  We are going to change the intravenous fluids with magnesium to every 2 weeks after next week's dose. He will see [name] on a monthly basis. He knows to call for any problems that may develop before the next visit here.   Chauncey Cruel, MD 09/12/2015

## 2015-09-19 ENCOUNTER — Other Ambulatory Visit: Payer: Self-pay

## 2015-09-19 ENCOUNTER — Ambulatory Visit: Payer: Self-pay

## 2015-09-20 ENCOUNTER — Other Ambulatory Visit: Payer: Self-pay

## 2015-09-20 ENCOUNTER — Ambulatory Visit (HOSPITAL_BASED_OUTPATIENT_CLINIC_OR_DEPARTMENT_OTHER): Payer: BC Managed Care – PPO

## 2015-09-20 ENCOUNTER — Other Ambulatory Visit (HOSPITAL_BASED_OUTPATIENT_CLINIC_OR_DEPARTMENT_OTHER): Payer: BC Managed Care – PPO

## 2015-09-20 DIAGNOSIS — C9110 Chronic lymphocytic leukemia of B-cell type not having achieved remission: Secondary | ICD-10-CM

## 2015-09-20 LAB — CBC WITH DIFFERENTIAL/PLATELET
BASO%: 1 % (ref 0.0–2.0)
BASOS ABS: 0.1 10*3/uL (ref 0.0–0.1)
EOS ABS: 0 10*3/uL (ref 0.0–0.5)
EOS%: 0.2 % (ref 0.0–7.0)
HCT: 37 % — ABNORMAL LOW (ref 38.4–49.9)
HEMOGLOBIN: 11.9 g/dL — AB (ref 13.0–17.1)
LYMPH%: 23.4 % (ref 14.0–49.0)
MCH: 28.9 pg (ref 27.2–33.4)
MCHC: 32.2 g/dL (ref 32.0–36.0)
MCV: 89.7 fL (ref 79.3–98.0)
MONO#: 0.3 10*3/uL (ref 0.1–0.9)
MONO%: 4.2 % (ref 0.0–14.0)
NEUT%: 71.2 % (ref 39.0–75.0)
NEUTROS ABS: 4.8 10*3/uL (ref 1.5–6.5)
PLATELETS: 157 10*3/uL (ref 140–400)
RBC: 4.13 10*6/uL — ABNORMAL LOW (ref 4.20–5.82)
RDW: 17.9 % — AB (ref 11.0–14.6)
WBC: 6.7 10*3/uL (ref 4.0–10.3)
lymph#: 1.6 10*3/uL (ref 0.9–3.3)

## 2015-09-20 LAB — COMPREHENSIVE METABOLIC PANEL (CC13)
ALBUMIN: 3.6 g/dL (ref 3.5–5.0)
ALK PHOS: 90 U/L (ref 40–150)
ALT: 60 U/L — ABNORMAL HIGH (ref 0–55)
ANION GAP: 10 meq/L (ref 3–11)
AST: 42 U/L — ABNORMAL HIGH (ref 5–34)
BILIRUBIN TOTAL: 0.32 mg/dL (ref 0.20–1.20)
BUN: 95.7 mg/dL — ABNORMAL HIGH (ref 7.0–26.0)
CALCIUM: 8.8 mg/dL (ref 8.4–10.4)
CO2: 18 mEq/L — ABNORMAL LOW (ref 22–29)
Chloride: 113 mEq/L — ABNORMAL HIGH (ref 98–109)
Creatinine: 2.7 mg/dL — ABNORMAL HIGH (ref 0.7–1.3)
EGFR: 23 mL/min/{1.73_m2} — AB (ref >90)
Glucose: 236 mg/dl — ABNORMAL HIGH (ref 70–140)
Potassium: 5.2 mEq/L — ABNORMAL HIGH (ref 3.5–5.1)
Sodium: 141 mEq/L (ref 136–145)
TOTAL PROTEIN: 5.7 g/dL — AB (ref 6.4–8.3)

## 2015-09-20 LAB — MAGNESIUM (CC13): MAGNESIUM: 1.7 mg/dL (ref 1.5–2.5)

## 2015-09-20 MED ORDER — SODIUM CHLORIDE 0.9 % IV SOLN
Freq: Once | INTRAVENOUS | Status: AC
  Administered 2015-09-20: 11:00:00 via INTRAVENOUS
  Filled 2015-09-20: qty 500

## 2015-09-20 MED ORDER — HEPARIN SOD (PORK) LOCK FLUSH 100 UNIT/ML IV SOLN
500.0000 [IU] | Freq: Once | INTRAVENOUS | Status: AC
  Administered 2015-09-20: 500 [IU] via INTRAVENOUS
  Filled 2015-09-20: qty 5

## 2015-09-20 MED ORDER — SODIUM CHLORIDE 0.9 % IV SOLN
Freq: Once | INTRAVENOUS | Status: AC
  Administered 2015-09-20: 10:00:00 via INTRAVENOUS

## 2015-09-20 MED ORDER — SODIUM CHLORIDE 0.9 % IJ SOLN
10.0000 mL | INTRAMUSCULAR | Status: DC | PRN
  Administered 2015-09-20: 10 mL via INTRAVENOUS
  Filled 2015-09-20: qty 10

## 2015-09-20 MED ORDER — HEPARIN SOD (PORK) LOCK FLUSH 100 UNIT/ML IV SOLN
500.0000 [IU] | Freq: Once | INTRAVENOUS | Status: DC | PRN
  Filled 2015-09-20: qty 5

## 2015-09-20 NOTE — Progress Notes (Signed)
Reviewed labs with Zigmund Daniel RN (Dr. Virgie Dad nurse). Pt to receive NS with 1 Gram of Magnesium today. Pt request to have labs checked weekly even though plan is to start having Infusions every two weeks. Zigmund Daniel RN aware and states she will put in POF for pt to have weekly labs drawn including Magnesium. Pt states he is not in need for nausea medication today.

## 2015-09-20 NOTE — Patient Instructions (Signed)
Hypomagnesemia Magnesium is a common ion (mineral) in the body which is needed for metabolism. It is about how the body handles food and other chemical reactions necessary for life. Only about 2% of the magnesium in our body is found in the blood. When this is low, it is called hypomagnesemia. The blood will measure only a tiny amount of the magnesium in our body. When it is low in our blood, it does not mean that the whole body supply is low. The normal serum concentration ranges from 1.8-2.5 mEq/L. When the level gets to be less than 1.0 mEq/L, a number of problems begin to happen.  CAUSES   Receiving intravenous fluids without magnesium replacement.  Loss of magnesium from the bowel by nasogastric suction.  Loss of magnesium from nausea and vomiting or severe diarrhea. Any of the inflammatory bowel conditions can cause this.  Abuse of alcohol often leads to low serum magnesium.  An inherited form of magnesium loss happens when the kidneys lose magnesium. This is called familial or primary hypomagnesemia.  Some medications such as diuretics also cause the loss of magnesium. SYMPTOMS  These following problems are worse if the changes in magnesium levels come on suddenly.  Tremor.  Confusion.  Muscle weakness.  Oversensitive to sights and sounds.  Sensitive reflexes.  Depression.  Muscular fibrillations.  Overreactivity of the nerves.  Irritability.  Psychosis.  Spasms of the hand muscles.  Tetany (where the muscles go into uncontrollable spasms). DIAGNOSIS  This condition can be diagnosed by blood tests. TREATMENT   In an emergency, magnesium can be given intravenously (by vein).  If the condition is less worrisome, it can be corrected by diet. High levels of magnesium are found in green leafy vegetables, peas, beans, and nuts among other things. It can also be given through medications by mouth.  If it is being caused by medications, changes can be made.  If  alcohol is a problem, help is available if there are difficulties giving it up. Document Released: 09/03/2005 Document Revised: 04/24/2014 Document Reviewed: 07/28/2008 [employer] Patient Information 2015 [employer]. This information is not intended to replace advice given to you by your health care provider. Make sure you discuss any questions you have with your health care provider.  

## 2015-09-21 ENCOUNTER — Telehealth: Payer: Self-pay | Admitting: Oncology

## 2015-09-21 NOTE — Telephone Encounter (Signed)
Called and left a message with weekly labs

## 2015-09-26 ENCOUNTER — Other Ambulatory Visit (HOSPITAL_BASED_OUTPATIENT_CLINIC_OR_DEPARTMENT_OTHER): Payer: BC Managed Care – PPO

## 2015-09-26 DIAGNOSIS — C9110 Chronic lymphocytic leukemia of B-cell type not having achieved remission: Secondary | ICD-10-CM

## 2015-09-26 LAB — CBC WITH DIFFERENTIAL/PLATELET
BASO%: 0.2 % (ref 0.0–2.0)
Basophils Absolute: 0 10*3/uL (ref 0.0–0.1)
EOS ABS: 0.1 10*3/uL (ref 0.0–0.5)
EOS%: 0.8 % (ref 0.0–7.0)
HEMATOCRIT: 37.5 % — AB (ref 38.4–49.9)
HGB: 12 g/dL — ABNORMAL LOW (ref 13.0–17.1)
LYMPH#: 1.7 10*3/uL (ref 0.9–3.3)
LYMPH%: 26.8 % (ref 14.0–49.0)
MCH: 29.1 pg (ref 27.2–33.4)
MCHC: 32 g/dL (ref 32.0–36.0)
MCV: 91 fL (ref 79.3–98.0)
MONO#: 0.3 10*3/uL (ref 0.1–0.9)
MONO%: 4.8 % (ref 0.0–14.0)
NEUT%: 67.4 % (ref 39.0–75.0)
NEUTROS ABS: 4.4 10*3/uL (ref 1.5–6.5)
PLATELETS: 122 10*3/uL — AB (ref 140–400)
RBC: 4.12 10*6/uL — ABNORMAL LOW (ref 4.20–5.82)
RDW: 17.7 % — ABNORMAL HIGH (ref 11.0–14.6)
WBC: 6.5 10*3/uL (ref 4.0–10.3)

## 2015-09-26 LAB — COMPREHENSIVE METABOLIC PANEL (CC13)
ALBUMIN: 3.6 g/dL (ref 3.5–5.0)
ALK PHOS: 79 U/L (ref 40–150)
ALT: 45 U/L (ref 0–55)
AST: 33 U/L (ref 5–34)
Anion Gap: 10 mEq/L (ref 3–11)
BUN: 80.9 mg/dL — AB (ref 7.0–26.0)
CO2: 16 meq/L — AB (ref 22–29)
CREATININE: 2.4 mg/dL — AB (ref 0.7–1.3)
Calcium: 8.9 mg/dL (ref 8.4–10.4)
Chloride: 115 mEq/L — ABNORMAL HIGH (ref 98–109)
EGFR: 26 mL/min/{1.73_m2} — AB (ref >90)
GLUCOSE: 196 mg/dL — AB (ref 70–140)
Potassium: 5 mEq/L (ref 3.5–5.1)
SODIUM: 141 meq/L (ref 136–145)
TOTAL PROTEIN: 5.8 g/dL — AB (ref 6.4–8.3)
Total Bilirubin: 0.31 mg/dL (ref 0.20–1.20)

## 2015-09-26 LAB — MAGNESIUM (CC13): MAGNESIUM: 1.8 mg/dL (ref 1.5–2.5)

## 2015-10-03 ENCOUNTER — Ambulatory Visit: Payer: Self-pay | Admitting: Nurse Practitioner

## 2015-10-03 ENCOUNTER — Other Ambulatory Visit (HOSPITAL_BASED_OUTPATIENT_CLINIC_OR_DEPARTMENT_OTHER): Payer: BC Managed Care – PPO

## 2015-10-03 ENCOUNTER — Ambulatory Visit (HOSPITAL_BASED_OUTPATIENT_CLINIC_OR_DEPARTMENT_OTHER): Payer: BC Managed Care – PPO

## 2015-10-03 DIAGNOSIS — C9110 Chronic lymphocytic leukemia of B-cell type not having achieved remission: Secondary | ICD-10-CM

## 2015-10-03 DIAGNOSIS — M818 Other osteoporosis without current pathological fracture: Secondary | ICD-10-CM | POA: Diagnosis not present

## 2015-10-03 LAB — CBC WITH DIFFERENTIAL/PLATELET
BASO%: 0.8 % (ref 0.0–2.0)
BASOS ABS: 0.1 10*3/uL (ref 0.0–0.1)
EOS ABS: 0.1 10*3/uL (ref 0.0–0.5)
EOS%: 0.8 % (ref 0.0–7.0)
HEMATOCRIT: 36.1 % — AB (ref 38.4–49.9)
HGB: 11.8 g/dL — ABNORMAL LOW (ref 13.0–17.1)
LYMPH#: 1.6 10*3/uL (ref 0.9–3.3)
LYMPH%: 23.4 % (ref 14.0–49.0)
MCH: 29.7 pg (ref 27.2–33.4)
MCHC: 32.7 g/dL (ref 32.0–36.0)
MCV: 90.9 fL (ref 79.3–98.0)
MONO#: 0.4 10*3/uL (ref 0.1–0.9)
MONO%: 6.1 % (ref 0.0–14.0)
NEUT#: 4.8 10*3/uL (ref 1.5–6.5)
NEUT%: 68.9 % (ref 39.0–75.0)
PLATELETS: 148 10*3/uL (ref 140–400)
RBC: 3.97 10*6/uL — ABNORMAL LOW (ref 4.20–5.82)
RDW: 18.6 % — ABNORMAL HIGH (ref 11.0–14.6)
WBC: 7 10*3/uL (ref 4.0–10.3)

## 2015-10-03 LAB — COMPREHENSIVE METABOLIC PANEL (CC13)
ALK PHOS: 104 U/L (ref 40–150)
ALT: 53 U/L (ref 0–55)
ANION GAP: 10 meq/L (ref 3–11)
AST: 40 U/L — ABNORMAL HIGH (ref 5–34)
Albumin: 3.7 g/dL (ref 3.5–5.0)
BUN: 83.9 mg/dL — ABNORMAL HIGH (ref 7.0–26.0)
CALCIUM: 9.1 mg/dL (ref 8.4–10.4)
CHLORIDE: 113 meq/L — AB (ref 98–109)
CO2: 18 mEq/L — ABNORMAL LOW (ref 22–29)
Creatinine: 2.6 mg/dL — ABNORMAL HIGH (ref 0.7–1.3)
EGFR: 24 mL/min/{1.73_m2} — AB (ref >90)
Glucose: 125 mg/dl (ref 70–140)
POTASSIUM: 5 meq/L (ref 3.5–5.1)
Sodium: 142 mEq/L (ref 136–145)
Total Bilirubin: 0.31 mg/dL (ref 0.20–1.20)
Total Protein: 5.9 g/dL — ABNORMAL LOW (ref 6.4–8.3)

## 2015-10-03 LAB — MAGNESIUM (CC13): MAGNESIUM: 1.8 mg/dL (ref 1.5–2.5)

## 2015-10-03 MED ORDER — SODIUM CHLORIDE 0.9 % IV SOLN
Freq: Once | INTRAVENOUS | Status: AC
  Administered 2015-10-03: 11:00:00 via INTRAVENOUS
  Filled 2015-10-03: qty 500

## 2015-10-03 MED ORDER — HEPARIN SOD (PORK) LOCK FLUSH 100 UNIT/ML IV SOLN
500.0000 [IU] | Freq: Once | INTRAVENOUS | Status: AC | PRN
  Administered 2015-10-03: 500 [IU]
  Filled 2015-10-03: qty 5

## 2015-10-03 MED ORDER — SODIUM CHLORIDE 0.9 % IV SOLN
INTRAVENOUS | Status: DC
  Administered 2015-10-03: 10:00:00 via INTRAVENOUS

## 2015-10-03 MED ORDER — DENOSUMAB 60 MG/ML ~~LOC~~ SOLN
60.0000 mg | Freq: Once | SUBCUTANEOUS | Status: AC
  Administered 2015-10-03: 60 mg via SUBCUTANEOUS
  Filled 2015-10-03: qty 1

## 2015-10-03 MED ORDER — SODIUM CHLORIDE 0.9 % IJ SOLN
10.0000 mL | INTRAMUSCULAR | Status: DC | PRN
  Administered 2015-10-03: 10 mL via INTRAVENOUS
  Filled 2015-10-03: qty 10

## 2015-10-03 NOTE — Patient Instructions (Signed)
Hypomagnesemia Hypomagnesemia is a condition in which the level of magnesium in the blood is low. Magnesium is a mineral that is found in many foods. It is used in many different processes in the body. Hypomagnesemia can affect every organ in the body. It can cause life-threatening problems. CAUSES Causes of hypomagnesemia include:  Not getting enough magnesium in your diet.  Malnutrition.  Problems with absorbing magnesium from the intestines.  Dehydration.  Alcohol abuse.  Vomiting.  Severe diarrhea.  Some medicines, including medicines that make you urinate more.  Certain diseases, such as kidney disease, diabetes, and overactive thyroid. SIGNS AND SYMPTOMS  Involuntary shaking or trembling of a body part (tremor).  Confusion.  Muscle weakness.  Sensitivity to light, sound, and touch.  Psychiatric issues, such as depression, irritability, or psychosis.  Sudden tightening of muscles (muscle spasms).  Tingling in the arms and legs.  A feeling of fluttering of the heart. These symptoms are more severe if magnesium levels drop suddenly. DIAGNOSIS To make a diagnosis, your health care provider will do a physical exam and order blood and urine tests. TREATMENT Treatment will depend on the cause and the severity of your condition. It may involve:  A magnesium supplement. This can be taken in pill form. It can also be given through an IV tube. This is usually done if the condition is severe.  Changes to your diet. You may be directed to eat foods that have a lot of magnesium, such as green leafy vegetables, peas, beans, and nuts.  Eliminating alcohol from your diet. HOME CARE INSTRUCTIONS  Include foods with magnesium in your diet. Foods that are rich in magnesium include green vegetables, beans, nuts and seeds, and whole grains.  Take medicines only as directed by your health care provider.  Take magnesium supplements if your health care provider instructs you to  do that. Take them as directed.  Have your magnesium levels monitored as directed by your health care provider.  When you are active, drink fluids that contain electrolytes.  Keep all follow-up visits as directed by your health care provider. This is important. SEEK MEDICAL CARE IF:  You get worse instead of better.  Your symptoms return. SEEK IMMEDIATE MEDICAL CARE IF:  Your symptoms are severe.   This information is not intended to replace advice given to you by your health care provider. Make sure you discuss any questions you have with your health care provider.   Document Released: 09/03/2005 Document Revised: 12/29/2014 Document Reviewed: 07/24/2014 Elsevier Interactive Patient Education 2016 Elsevier Inc.  Denosumab injection What is this medicine? DENOSUMAB (den oh sue mab) slows bone breakdown. Prolia is used to treat osteoporosis in women after menopause and in men. Delton See is used to prevent bone fractures and other bone problems caused by cancer bone metastases. Delton See is also used to treat giant cell tumor of the bone. This medicine may be used for other purposes; ask your health care provider or pharmacist if you have questions. What should I tell my health care provider before I take this medicine? They need to know if you have any of these conditions: -dental disease -eczema -infection or history of infections -kidney disease or on dialysis -low blood calcium or vitamin D -malabsorption syndrome -scheduled to have surgery or tooth extraction -taking medicine that contains denosumab -thyroid or parathyroid disease -an unusual reaction to denosumab, other medicines, foods, dyes, or preservatives -pregnant or trying to get pregnant -breast-feeding How should I use this medicine? This medicine is for  injection under the skin. It is given by a health care professional in a hospital or clinic setting. If you are getting Prolia, a special MedGuide will be given to you  by the pharmacist with each prescription and refill. Be sure to read this information carefully each time. For Prolia, talk to your pediatrician regarding the use of this medicine in children. Special care may be needed. For Delton See, talk to your pediatrician regarding the use of this medicine in children. While this drug may be prescribed for children as young as 13 years for selected conditions, precautions do apply. Overdosage: If you think you have taken too much of this medicine contact a poison control center or emergency room at once. NOTE: This medicine is only for you. Do not share this medicine with others. What if I miss a dose? It is important not to miss your dose. Call your doctor or health care professional if you are unable to keep an appointment. What may interact with this medicine? Do not take this medicine with any of the following medications: -other medicines containing denosumab This medicine may also interact with the following medications: -medicines that suppress the immune system -medicines that treat cancer -steroid medicines like prednisone or cortisone This list may not describe all possible interactions. Give your health care provider a list of all the medicines, herbs, non-prescription drugs, or dietary supplements you use. Also tell them if you smoke, drink alcohol, or use illegal drugs. Some items may interact with your medicine. What should I watch for while using this medicine? Visit your doctor or health care professional for regular checks on your progress. Your doctor or health care professional may order blood tests and other tests to see how you are doing. Call your doctor or health care professional if you get a cold or other infection while receiving this medicine. Do not treat yourself. This medicine may decrease your body's ability to fight infection. You should make sure you get enough calcium and vitamin D while you are taking this medicine, unless your  doctor tells you not to. Discuss the foods you eat and the vitamins you take with your health care professional. See your dentist regularly. Brush and floss your teeth as directed. Before you have any dental work done, tell your dentist you are receiving this medicine. Do not become pregnant while taking this medicine or for 5 months after stopping it. Women should inform their doctor if they wish to become pregnant or think they might be pregnant. There is a potential for serious side effects to an unborn child. Talk to your health care professional or pharmacist for more information. What side effects may I notice from receiving this medicine? Side effects that you should report to your doctor or health care professional as soon as possible: -allergic reactions like skin rash, itching or hives, swelling of the face, lips, or tongue -breathing problems -chest pain -fast, irregular heartbeat -feeling faint or lightheaded, falls -fever, chills, or any other sign of infection -muscle spasms, tightening, or twitches -numbness or tingling -skin blisters or bumps, or is dry, peels, or red -slow healing or unexplained pain in the mouth or jaw -unusual bleeding or bruising Side effects that usually do not require medical attention (Report these to your doctor or health care professional if they continue or are bothersome.): -muscle pain -stomach upset, gas This list may not describe all possible side effects. Call your doctor for medical advice about side effects. You may report side effects to  FDA at 1-800-FDA-1088. Where should I keep my medicine? This medicine is only given in a clinic, doctor's office, or other health care setting and will not be stored at home. NOTE: This sheet is a summary. It may not cover all possible information. If you have questions about this medicine, talk to your doctor, pharmacist, or health care provider.    2016, Elsevier/Gold Standard. (2012-06-07 12:37:47)

## 2015-10-03 NOTE — Progress Notes (Signed)
Pt with skin tear on L palm. Cleansed with NS, triple antibiotic and gauze dressing applied. Instructed pt and pt's wife to change daily.

## 2015-10-10 ENCOUNTER — Other Ambulatory Visit (HOSPITAL_BASED_OUTPATIENT_CLINIC_OR_DEPARTMENT_OTHER): Payer: BC Managed Care – PPO

## 2015-10-10 ENCOUNTER — Other Ambulatory Visit: Payer: Self-pay | Admitting: Oncology

## 2015-10-10 DIAGNOSIS — C9110 Chronic lymphocytic leukemia of B-cell type not having achieved remission: Secondary | ICD-10-CM | POA: Diagnosis not present

## 2015-10-10 LAB — COMPREHENSIVE METABOLIC PANEL (CC13)
ALT: 37 U/L (ref 0–55)
ANION GAP: 9 meq/L (ref 3–11)
AST: 30 U/L (ref 5–34)
Albumin: 3.7 g/dL (ref 3.5–5.0)
Alkaline Phosphatase: 73 U/L (ref 40–150)
BUN: 80.7 mg/dL — ABNORMAL HIGH (ref 7.0–26.0)
CHLORIDE: 116 meq/L — AB (ref 98–109)
CO2: 17 meq/L — AB (ref 22–29)
CREATININE: 2 mg/dL — AB (ref 0.7–1.3)
Calcium: 8.2 mg/dL — ABNORMAL LOW (ref 8.4–10.4)
EGFR: 32 mL/min/{1.73_m2} — ABNORMAL LOW (ref >90)
Glucose: 101 mg/dl (ref 70–140)
POTASSIUM: 5.1 meq/L (ref 3.5–5.1)
Sodium: 143 mEq/L (ref 136–145)
Total Bilirubin: 0.36 mg/dL (ref 0.20–1.20)
Total Protein: 5.9 g/dL — ABNORMAL LOW (ref 6.4–8.3)

## 2015-10-10 LAB — CBC WITH DIFFERENTIAL/PLATELET
BASO%: 0 % (ref 0.0–2.0)
Basophils Absolute: 0 10*3/uL (ref 0.0–0.1)
EOS%: 0.3 % (ref 0.0–7.0)
Eosinophils Absolute: 0 10*3/uL (ref 0.0–0.5)
HCT: 37.1 % — ABNORMAL LOW (ref 38.4–49.9)
HGB: 11.8 g/dL — ABNORMAL LOW (ref 13.0–17.1)
LYMPH%: 25.1 % (ref 14.0–49.0)
MCH: 29.2 pg (ref 27.2–33.4)
MCHC: 31.8 g/dL — AB (ref 32.0–36.0)
MCV: 91.8 fL (ref 79.3–98.0)
MONO#: 0.4 10*3/uL (ref 0.1–0.9)
MONO%: 5.6 % (ref 0.0–14.0)
NEUT#: 4.8 10*3/uL (ref 1.5–6.5)
NEUT%: 69 % (ref 39.0–75.0)
PLATELETS: 144 10*3/uL (ref 140–400)
RBC: 4.04 10*6/uL — AB (ref 4.20–5.82)
RDW: 18.2 % — ABNORMAL HIGH (ref 11.0–14.6)
WBC: 7 10*3/uL (ref 4.0–10.3)
lymph#: 1.8 10*3/uL (ref 0.9–3.3)

## 2015-10-10 LAB — MAGNESIUM (CC13): Magnesium: 1.9 mg/dl (ref 1.5–2.5)

## 2015-10-11 ENCOUNTER — Other Ambulatory Visit: Payer: Self-pay | Admitting: Nurse Practitioner

## 2015-10-11 LAB — IGG, IGA, IGM
IGA: 7 mg/dL — AB (ref 68–379)
IgG (Immunoglobin G), Serum: 303 mg/dL — ABNORMAL LOW (ref 650–1600)
IgM, Serum: <5 mg/dL — ABNORMAL LOW (ref 41–251)

## 2015-10-16 ENCOUNTER — Telehealth: Payer: Self-pay

## 2015-10-16 NOTE — Telephone Encounter (Signed)
Pt called stating he has regular magnesium infusions with next on 10/26. He is asking what the 10/27 infusion appt for 6 hours is for-he was unaware of this.

## 2015-10-16 NOTE — Telephone Encounter (Signed)
Infusion is for IVIG. Patient called and confirmed.

## 2015-10-17 ENCOUNTER — Ambulatory Visit (HOSPITAL_BASED_OUTPATIENT_CLINIC_OR_DEPARTMENT_OTHER): Payer: BC Managed Care – PPO

## 2015-10-17 ENCOUNTER — Other Ambulatory Visit (HOSPITAL_BASED_OUTPATIENT_CLINIC_OR_DEPARTMENT_OTHER): Payer: BC Managed Care – PPO

## 2015-10-17 DIAGNOSIS — C9110 Chronic lymphocytic leukemia of B-cell type not having achieved remission: Secondary | ICD-10-CM | POA: Diagnosis not present

## 2015-10-17 LAB — COMPREHENSIVE METABOLIC PANEL (CC13)
ALBUMIN: 3.7 g/dL (ref 3.5–5.0)
ALK PHOS: 90 U/L (ref 40–150)
ALT: 54 U/L (ref 0–55)
ANION GAP: 9 meq/L (ref 3–11)
AST: 30 U/L (ref 5–34)
BUN: 77.7 mg/dL — AB (ref 7.0–26.0)
CALCIUM: 9.5 mg/dL (ref 8.4–10.4)
CHLORIDE: 115 meq/L — AB (ref 98–109)
CO2: 20 mEq/L — ABNORMAL LOW (ref 22–29)
CREATININE: 2.3 mg/dL — AB (ref 0.7–1.3)
EGFR: 27 mL/min/{1.73_m2} — ABNORMAL LOW (ref >90)
Glucose: 146 mg/dl — ABNORMAL HIGH (ref 70–140)
POTASSIUM: 5.2 meq/L — AB (ref 3.5–5.1)
Sodium: 143 mEq/L (ref 136–145)
Total Bilirubin: 0.3 mg/dL (ref 0.20–1.20)
Total Protein: 5.9 g/dL — ABNORMAL LOW (ref 6.4–8.3)

## 2015-10-17 LAB — CBC WITH DIFFERENTIAL/PLATELET
BASO%: 0.7 % (ref 0.0–2.0)
BASOS ABS: 0.1 10*3/uL (ref 0.0–0.1)
EOS ABS: 0 10*3/uL (ref 0.0–0.5)
EOS%: 0.4 % (ref 0.0–7.0)
HEMATOCRIT: 38 % — AB (ref 38.4–49.9)
HGB: 12.2 g/dL — ABNORMAL LOW (ref 13.0–17.1)
LYMPH#: 1.7 10*3/uL (ref 0.9–3.3)
LYMPH%: 23.6 % (ref 14.0–49.0)
MCH: 29.6 pg (ref 27.2–33.4)
MCHC: 32.2 g/dL (ref 32.0–36.0)
MCV: 91.7 fL (ref 79.3–98.0)
MONO#: 0.4 10*3/uL (ref 0.1–0.9)
MONO%: 5.3 % (ref 0.0–14.0)
NEUT#: 5.1 10*3/uL (ref 1.5–6.5)
NEUT%: 70 % (ref 39.0–75.0)
PLATELETS: 154 10*3/uL (ref 140–400)
RBC: 4.14 10*6/uL — ABNORMAL LOW (ref 4.20–5.82)
RDW: 19.1 % — ABNORMAL HIGH (ref 11.0–14.6)
WBC: 7.3 10*3/uL (ref 4.0–10.3)

## 2015-10-17 LAB — MAGNESIUM (CC13): Magnesium: 1.8 mg/dl (ref 1.5–2.5)

## 2015-10-17 LAB — TECHNOLOGIST REVIEW

## 2015-10-17 MED ORDER — SODIUM CHLORIDE 0.9 % IV SOLN
Freq: Once | INTRAVENOUS | Status: AC
  Administered 2015-10-17: 10:00:00 via INTRAVENOUS
  Filled 2015-10-17: qty 500

## 2015-10-17 MED ORDER — HEPARIN SOD (PORK) LOCK FLUSH 100 UNIT/ML IV SOLN
500.0000 [IU] | Freq: Once | INTRAVENOUS | Status: AC | PRN
  Administered 2015-10-17: 500 [IU]
  Filled 2015-10-17: qty 5

## 2015-10-17 NOTE — Patient Instructions (Signed)
Hypomagnesemia °Hypomagnesemia is a condition in which the level of magnesium in the blood is low. Magnesium is a mineral that is found in many foods. It is used in many different processes in the body. Hypomagnesemia can affect every organ in the body. It can cause life-threatening problems. °CAUSES °Causes of hypomagnesemia include: °· Not getting enough magnesium in your diet. °· Malnutrition. °· Problems with absorbing magnesium from the intestines. °· Dehydration. °· Alcohol abuse. °· Vomiting. °· Severe diarrhea. °· Some medicines, including medicines that make you urinate more. °· Certain diseases, such as kidney disease, diabetes, and overactive thyroid. °SIGNS AND SYMPTOMS °· Involuntary shaking or trembling of a body part (tremor). °· Confusion. °· Muscle weakness. °· Sensitivity to light, sound, and touch. °· Psychiatric issues, such as depression, irritability, or psychosis. °· Sudden tightening of muscles (muscle spasms). °· Tingling in the arms and legs. °· A feeling of fluttering of the heart. °These symptoms are more severe if magnesium levels drop suddenly. °DIAGNOSIS °To make a diagnosis, your health care provider will do a physical exam and order blood and urine tests. °TREATMENT °Treatment will depend on the cause and the severity of your condition. It may involve: °· A magnesium supplement. This can be taken in pill form. It can also be given through an IV tube. This is usually done if the condition is severe. °· Changes to your diet. You may be directed to eat foods that have a lot of magnesium, such as green leafy vegetables, peas, beans, and nuts. °· Eliminating alcohol from your diet. °HOME CARE INSTRUCTIONS °· Include foods with magnesium in your diet. Foods that are rich in magnesium include green vegetables, beans, nuts and seeds, and whole grains. °· Take medicines only as directed by your health care provider. °· Take magnesium supplements if your health care provider instructs you to  do that. Take them as directed. °· Have your magnesium levels monitored as directed by your health care provider. °· When you are active, drink fluids that contain electrolytes. °· Keep all follow-up visits as directed by your health care provider. This is important. °SEEK MEDICAL CARE IF: °· You get worse instead of better. °· Your symptoms return. °SEEK IMMEDIATE MEDICAL CARE IF: °· Your symptoms are severe. °  °This information is not intended to replace advice given to you by your health care provider. Make sure you discuss any questions you have with your health care provider. °  °Document Released: 09/03/2005 Document Revised: 12/29/2014 Document Reviewed: 07/24/2014 °Elsevier Interactive Patient Education ©2016 [employer] ° °

## 2015-10-18 ENCOUNTER — Ambulatory Visit (HOSPITAL_BASED_OUTPATIENT_CLINIC_OR_DEPARTMENT_OTHER): Payer: BC Managed Care – PPO

## 2015-10-18 DIAGNOSIS — D801 Nonfamilial hypogammaglobulinemia: Secondary | ICD-10-CM | POA: Diagnosis not present

## 2015-10-18 DIAGNOSIS — D89811 Chronic graft-versus-host disease: Secondary | ICD-10-CM

## 2015-10-18 MED ORDER — IMMUNE GLOBULIN (HUMAN) 20 GM/200ML IV SOLN
100.0000 g | Freq: Once | INTRAVENOUS | Status: AC
  Administered 2015-10-18: 100 g via INTRAVENOUS
  Filled 2015-10-18: qty 1000

## 2015-10-18 NOTE — Patient Instructions (Signed)
Immune Globulin Injection What is this medicine? IMMUNE GLOBULIN (im MUNE GLOB yoo lin) helps to prevent or reduce the severity of certain infections in patients who are at risk. This medicine is collected from the pooled blood of many donors. It is used to treat immune system problems, thrombocytopenia, and Kawasaki syndrome. This medicine may be used for other purposes; ask your health care provider or pharmacist if you have questions. COMMON BRAND NAME(S): Baygam, BIVIGAM, Carimune, Carimune NF, Flebogamma, Flebogamma DIF, GamaSTAN S/D, Gamimune N, Gammagard S/D, Gammaked, Gammaplex, Gammar-P IV, Gamunex, Gamunex-C, Hizentra, Iveegam, Iveegam EN, Octagam, Panglobulin, Panglobulin NF, Polygam S/D, Privigen, Sandoglobulin, Venoglobulin-S, Vigam, Vivaglobulin What should I tell my health care provider before I take this medicine? They need to know if you have any of these conditions: - diabetes - extremely low or no immune antibodies in the blood - heart disease - history of blood clots - hyperprolinemia - infection in the blood, sepsis - kidney disease - taking medicine that may change kidney function - ask your health care provider about your medicine - an unusual or allergic reaction to human immune globulin, albumin, maltose, sucrose, polysorbate 80, other medicines, foods, dyes, or preservatives - pregnant or trying to get pregnant - breast-feeding How should I use this medicine? This medicine is for injection into a muscle or infusion into a vein or skin. It is usually given by a health care professional in a hospital or clinic setting. In rare cases, some brands of this medicine might be given at home. You will be taught how to give this medicine. Use exactly as directed. Take your medicine at regular intervals. Do not take your medicine more often than directed. Talk to your pediatrician regarding the use of this medicine in children. Special care may be needed. Overdosage:  If you think you have taken too much of this medicine contact a poison control center or emergency room at once. NOTE: This medicine is only for you. Do not share this medicine with others. What if I miss a dose? It is important not to miss your dose. Call your doctor or health care professional if you are unable to keep an appointment. If you give yourself the medicine and you miss a dose, take it as soon as you can. If it is almost time for your next dose, take only that dose. Do not take double or extra doses. What may interact with this medicine? -aspirin and aspirin-like medicines -cisplatin -cyclosporine -medicines for infection like acyclovir, adefovir, amphotericin B, bacitracin, cidofovir, foscarnet, ganciclovir, gentamicin, pentamidine, vancomycin -NSAIDS, medicines for pain and inflammation, like ibuprofen or naproxen -pamidronate -vaccines -zoledronic acid This list may not describe all possible interactions. Give your health care provider a list of all the medicines, herbs, non-prescription drugs, or dietary supplements you use. Also tell them if you smoke, drink alcohol, or use illegal drugs. Some items may interact with your medicine. What should I watch for while using this medicine? Your condition will be monitored carefully while you are receiving this medicine. This medicine is made from pooled blood donations of many different people. It may be possible to pass an infection in this medicine. However, the donors are screened for infections and all products are tested for HIV and hepatitis. The medicine is treated to kill most or all bacteria and viruses. Talk to your doctor about the risks and benefits of this medicine. Do not have vaccinations for at least 14 days before, or until at least 3 months after receiving this   medicine. What side effects may I notice from receiving this medicine? Side effects that you should report to your doctor or health care professional as soon as  possible: -allergic reactions like skin rash, itching or hives, swelling of the face, lips, or tongue -breathing problems -chest pain or tightness -fever, chills -headache with nausea, vomiting -neck pain or difficulty moving neck -pain when moving eyes -pain, swelling, warmth in the leg -problems with balance, talking, walking -sudden weight gain -swelling of the ankles, feet, hands -trouble passing urine or change in the amount of urine Side effects that usually do not require medical attention (report to your doctor or health care professional if they continue or are bothersome): -dizzy, drowsy -flushing -increased sweating -leg cramps -muscle aches and pains -pain at site where injected This list may not describe all possible side effects. Call your doctor for medical advice about side effects. You may report side effects to FDA at 1-800-FDA-1088. Where should I keep my medicine? Keep out of the reach of children. This drug is usually given in a hospital or clinic and will not be stored at home. In rare cases, some brands of this medicine may be given at home. If you are using this medicine at home, you will be instructed on how to store this medicine. Throw away any unused medicine after the expiration date on the label. NOTE: This sheet is a summary. It may not cover all possible information. If you have questions about this medicine, talk to your doctor, pharmacist, or health care provider.  2015, Elsevier/[employer] Standard. (2009-02-28 11:44:49)  Magnesium This is a blood test which measures the amount of magnesium in your blood. Most of the magnesium in your body exists in your cells. However, the magnesium in your blood is important for many processes. It is important for your nerves to be able to conduct electrical energy. This is important in heart patients with higher heartbeats. When your heart beats and then gets ready to beat again, it repolarizes. If the magnesium levels  are low or high, it can affect the accuracy of the repolarization process. The magnesium levels are also monitored during pregnancy. This is done to determine if the expectant mother may have preeclampsia or toxemia. Magnesium is often used for treatment of these problems. PREPARATION FOR TEST No preparation or fasting is needed. A blood sample may be taken by inserting a needle into a vein in the arm.  NORMAL FINDINGS  Adult: 1.3 to 2.1 mEq/L or 0.65 to 1.05 mmol/L (SI units)  Child: 1.4 to 1.7 mEq/L  Newborn: 1.4 to 2 mEq/L Possible critical values: less than 0.5 mEq/L or greater than 3 mEq/L Ranges for normal findings may vary among different laboratories and hospitals. You should always check with your caregiver after having lab work or other tests done to discuss the meaning of your test results and whether your values are considered within normal limits. MEANING OF TEST  Your caregiver will go over the test results with you. Your caregiver will discuss the importance and meaning of your results. He or she will also discuss treatment options and additional tests, if needed. OBTAINING THE TEST RESULTS It is your responsibility to obtain your test results. Ask the lab or department performing the test when and how you will get your results. Document Released: 01/10/2005 Document Revised: 03/01/2012 Document Reviewed: 11/18/2008 [employer] Patient Information 2015 [employer]. This information is not intended to replace advice given to you by your health care provider. Make sure   you discuss any questions you have with your health care provider.

## 2015-10-23 ENCOUNTER — Other Ambulatory Visit: Payer: Self-pay | Admitting: Oncology

## 2015-10-23 NOTE — Telephone Encounter (Signed)
Chart reviewed.

## 2015-10-24 ENCOUNTER — Other Ambulatory Visit (HOSPITAL_BASED_OUTPATIENT_CLINIC_OR_DEPARTMENT_OTHER): Payer: BC Managed Care – PPO

## 2015-10-24 DIAGNOSIS — C9110 Chronic lymphocytic leukemia of B-cell type not having achieved remission: Secondary | ICD-10-CM | POA: Diagnosis not present

## 2015-10-24 LAB — CBC WITH DIFFERENTIAL/PLATELET
BASO%: 0 % (ref 0.0–2.0)
Basophils Absolute: 0 10*3/uL (ref 0.0–0.1)
EOS ABS: 0 10*3/uL (ref 0.0–0.5)
EOS%: 0.3 % (ref 0.0–7.0)
HEMATOCRIT: 36.1 % — AB (ref 38.4–49.9)
HEMOGLOBIN: 11.9 g/dL — AB (ref 13.0–17.1)
LYMPH%: 25.9 % (ref 14.0–49.0)
MCH: 30.1 pg (ref 27.2–33.4)
MCHC: 33 g/dL (ref 32.0–36.0)
MCV: 91.2 fL (ref 79.3–98.0)
MONO#: 0.5 10*3/uL (ref 0.1–0.9)
MONO%: 9.1 % (ref 0.0–14.0)
NEUT%: 64.7 % (ref 39.0–75.0)
NEUTROS ABS: 3.8 10*3/uL (ref 1.5–6.5)
Platelets: 129 10*3/uL — ABNORMAL LOW (ref 140–400)
RBC: 3.96 10*6/uL — ABNORMAL LOW (ref 4.20–5.82)
RDW: 18.1 % — ABNORMAL HIGH (ref 11.0–14.6)
WBC: 5.9 10*3/uL (ref 4.0–10.3)
lymph#: 1.5 10*3/uL (ref 0.9–3.3)

## 2015-10-24 LAB — COMPREHENSIVE METABOLIC PANEL (CC13)
ALT: 41 U/L (ref 0–55)
ANION GAP: 10 meq/L (ref 3–11)
AST: 34 U/L (ref 5–34)
Albumin: 3.4 g/dL — ABNORMAL LOW (ref 3.5–5.0)
Alkaline Phosphatase: 73 U/L (ref 40–150)
BUN: 86.9 mg/dL — AB (ref 7.0–26.0)
CO2: 16 meq/L — AB (ref 22–29)
CREATININE: 2.3 mg/dL — AB (ref 0.7–1.3)
Calcium: 7.6 mg/dL — ABNORMAL LOW (ref 8.4–10.4)
Chloride: 119 mEq/L — ABNORMAL HIGH (ref 98–109)
EGFR: 28 mL/min/{1.73_m2} — ABNORMAL LOW (ref >90)
GLUCOSE: 93 mg/dL (ref 70–140)
Potassium: 4.6 mEq/L (ref 3.5–5.1)
Sodium: 144 mEq/L (ref 136–145)
TOTAL PROTEIN: 6.7 g/dL (ref 6.4–8.3)

## 2015-10-24 LAB — MAGNESIUM (CC13): Magnesium: 1.9 mg/dl (ref 1.5–2.5)

## 2015-10-31 ENCOUNTER — Telehealth: Payer: Self-pay | Admitting: Oncology

## 2015-10-31 ENCOUNTER — Ambulatory Visit (HOSPITAL_BASED_OUTPATIENT_CLINIC_OR_DEPARTMENT_OTHER): Payer: BC Managed Care – PPO | Admitting: Oncology

## 2015-10-31 ENCOUNTER — Telehealth: Payer: Self-pay

## 2015-10-31 ENCOUNTER — Other Ambulatory Visit (HOSPITAL_BASED_OUTPATIENT_CLINIC_OR_DEPARTMENT_OTHER): Payer: BC Managed Care – PPO

## 2015-10-31 ENCOUNTER — Ambulatory Visit (HOSPITAL_BASED_OUTPATIENT_CLINIC_OR_DEPARTMENT_OTHER): Payer: BC Managed Care – PPO

## 2015-10-31 VITALS — BP 146/76 | HR 74 | Temp 97.7°F | Resp 18 | Ht 66.0 in | Wt 221.4 lb

## 2015-10-31 DIAGNOSIS — Z9489 Other transplanted organ and tissue status: Secondary | ICD-10-CM

## 2015-10-31 DIAGNOSIS — C9110 Chronic lymphocytic leukemia of B-cell type not having achieved remission: Secondary | ICD-10-CM

## 2015-10-31 DIAGNOSIS — D801 Nonfamilial hypogammaglobulinemia: Secondary | ICD-10-CM | POA: Diagnosis not present

## 2015-10-31 LAB — CBC WITH DIFFERENTIAL/PLATELET
BASO%: 0.2 % (ref 0.0–2.0)
Basophils Absolute: 0 10*3/uL (ref 0.0–0.1)
EOS ABS: 0 10*3/uL (ref 0.0–0.5)
EOS%: 0.3 % (ref 0.0–7.0)
HCT: 35.7 % — ABNORMAL LOW (ref 38.4–49.9)
HGB: 11.4 g/dL — ABNORMAL LOW (ref 13.0–17.1)
LYMPH%: 25.8 % (ref 14.0–49.0)
MCH: 29.4 pg (ref 27.2–33.4)
MCHC: 31.9 g/dL — AB (ref 32.0–36.0)
MCV: 92 fL (ref 79.3–98.0)
MONO#: 0.4 10*3/uL (ref 0.1–0.9)
MONO%: 7.3 % (ref 0.0–14.0)
NEUT%: 66.4 % (ref 39.0–75.0)
NEUTROS ABS: 3.9 10*3/uL (ref 1.5–6.5)
Platelets: 118 10*3/uL — ABNORMAL LOW (ref 140–400)
RBC: 3.88 10*6/uL — AB (ref 4.20–5.82)
RDW: 18.1 % — ABNORMAL HIGH (ref 11.0–14.6)
WBC: 5.9 10*3/uL (ref 4.0–10.3)
lymph#: 1.5 10*3/uL (ref 0.9–3.3)

## 2015-10-31 LAB — COMPREHENSIVE METABOLIC PANEL (CC13)
ALT: 144 U/L — ABNORMAL HIGH (ref 0–55)
ANION GAP: 12 meq/L — AB (ref 3–11)
AST: 94 U/L — ABNORMAL HIGH (ref 5–34)
Albumin: 3.3 g/dL — ABNORMAL LOW (ref 3.5–5.0)
Alkaline Phosphatase: 112 U/L (ref 40–150)
BUN: 84.3 mg/dL — ABNORMAL HIGH (ref 7.0–26.0)
CHLORIDE: 114 meq/L — AB (ref 98–109)
CO2: 13 meq/L — AB (ref 22–29)
Calcium: 9.2 mg/dL (ref 8.4–10.4)
Creatinine: 2.3 mg/dL — ABNORMAL HIGH (ref 0.7–1.3)
EGFR: 28 mL/min/{1.73_m2} — AB (ref >90)
GLUCOSE: 331 mg/dL — AB (ref 70–140)
POTASSIUM: 5 meq/L (ref 3.5–5.1)
SODIUM: 139 meq/L (ref 136–145)
TOTAL PROTEIN: 6.3 g/dL — AB (ref 6.4–8.3)
Total Bilirubin: <0.3 mg/dL (ref 0.20–1.20)

## 2015-10-31 LAB — MAGNESIUM (CC13): Magnesium: 1.6 mg/dl (ref 1.5–2.5)

## 2015-10-31 MED ORDER — HEPARIN SOD (PORK) LOCK FLUSH 100 UNIT/ML IV SOLN
500.0000 [IU] | Freq: Once | INTRAVENOUS | Status: AC
  Administered 2015-10-31: 500 [IU] via INTRAVENOUS
  Filled 2015-10-31: qty 5

## 2015-10-31 MED ORDER — LIDOCAINE-PRILOCAINE 2.5-2.5 % EX CREA: TOPICAL_CREAM | CUTANEOUS | Status: DC

## 2015-10-31 MED ORDER — DICLOFENAC SODIUM 1 % TD GEL: 2.0000 g | Freq: Two times a day (BID) | TRANSDERMAL | Status: AC

## 2015-10-31 MED ORDER — SODIUM CHLORIDE 0.9 % IV SOLN
Freq: Once | INTRAVENOUS | Status: AC
  Administered 2015-10-31: 11:00:00 via INTRAVENOUS

## 2015-10-31 MED ORDER — SODIUM CHLORIDE 0.9 % IJ SOLN
10.0000 mL | INTRAMUSCULAR | Status: DC | PRN
  Administered 2015-10-31: 10 mL via INTRAVENOUS
  Filled 2015-10-31: qty 10

## 2015-10-31 MED ORDER — SODIUM CHLORIDE 0.9 % IV SOLN
Freq: Once | INTRAVENOUS | Status: AC
  Administered 2015-10-31: 11:00:00 via INTRAVENOUS
  Filled 2015-10-31: qty 500

## 2015-10-31 NOTE — Progress Notes (Signed)
1030: Pt reports that he does not need his antinausea medications today.

## 2015-10-31 NOTE — Telephone Encounter (Signed)
Appointments made and avs printed for patient °

## 2015-10-31 NOTE — Telephone Encounter (Signed)
Called patient today regarding his blood sugar, which was 331.  Patient admits to being up most of the night watching the election returns and snacking.  Patient also did not take his insulin this morning before his lab work.  He will test his blood sugars before breakfast and supper then the next day before lunch and bedtime alternating every other day for 7 days per Dr. Jana Hakim.  He will call Dr. Brigitte Pulse with readings if he continues to be 250 or greater.

## 2015-10-31 NOTE — Progress Notes (Signed)
ID: Timothy [name]   DOB: 1946/01/14  MR#: [id number]  SVX#:[id number]  GIE:ODDY, [name], MD SU: OTHER MD: [name]; [name], MD; [name], [name],MD; [name], MD; [facility] in [place], [place]:  [name], RN [phone]), [name] M.D.  CHIEF COMPLAINT:  CLL, status post allogeneic stem cell transplant, GVHD  CURRENT TREATMENT: Immunosuppression, magnesium, fluids, supportive care  HISTORY OF CLL: From the original intake note:  We have very complete records from Dr. Racheal Patches in [place], and in summary:  The patient was initially diagnosed in August 2000, with a white cell count of 23,600, but normal hemoglobin and platelets, and no significant symptomatology. Over the next several years his white cell count drifted up, and he eventually developed some symptoms of night sweats in particular, leading to treatment with fludarabine, Cytoxan and rituxan for five cycles given between December 2006 and May 2007.  We have CT scans from June 2006, November 2006 and April 2007, and comparing the November 2006 and April 2007 scans, there was near complete response. He had subsequent therapy in [place] as detailed below, but with decreased response, leading to allogeneic stem-cell transplant at the [facility] 02/24/2012.  Subsequent history is as detailed below.  INTERVAL HISTORY: Penn returns today for continuing follow up of his chronic lymphoid leukemia and graft-versus-host disease. As usual his wife Timothy [name] comes with.  [name] is now receiving his magnesium infusions every 2 weeks. That does not seem to be affecting his magnesium levels or his functional status. He continues to have about 3-4 soft stools a day, of which one or 2 are at night. He still has his stool about 2 AM most days. He stopped taking his Questran. He simply does not like that medication. He does use Imodium usually twice daily. He  does not have Lomotil available.  He has not had any fevers, drenching sweats, or worsening problems with rash. He is still at about 2500-3000 steps a day. He tells me he is going to be increasing that. He says his hemoglobin A1c most recently was 5.9. 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   REVIEW OF SYSTEMS: Aside from the problems just discussed, a detailed review of systems today was stable  PAST MEDICAL HISTORY: Past Medical History  Diagnosis Date  . 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 Chronic graft-versus-host disease   . Diverticular disease   . Hyperlipidemia   . Obesity   . Hypertension   . Hiatal hernia   . CMV (cytomegalovirus) antibody positive     pre-transplant, with seroconversion x2 pst-transplant  . Right bundle branch block     pre-transplant  . CKD (chronic kidney disease) stage 2, GFR 60-89 ml/min   . Pancytopenia   . Atrial fibrillation     post-transplant  . Myopathy   . 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 Chronic graft-versus-host disease   . 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 Diarrhea in adult patient 12/05/2012    Due to active GVHD  . Rash of face 12/05/2012    Due to GVHD  . Hypomagnesemia 01/26/2013  . Left hip pain 12/01/2013  . Steroid-induced diabetes     novalog  . Leukemia, chronic lymphoid   . CLL (chronic lymphocytic leukemia) 12/05/2012    Dx 07/1999; started Rx 12/06  AlloBMT 3/13    PAST SURGICAL HISTORY: Past Surgical History  Procedure Laterality Date  . Tonsillectomy and adenoidectomy    . Bone marrow transplant    . Flexible sigmoidoscopy  11/17/2012    Procedure: FLEXIBLE SIGMOIDOSCOPY;  Surgeon: Jeryl Columbia, MD;  Location: [facility] END[facility];  Service: Endoscopy;  Laterality: N/A;  Dr Watt Climes states will be admitted to rooom 1339 11/16/12  . Esophagogastroduodenoscopy  11/17/2012    Procedure: ESOPHAGOGASTRODUODENOSCOPY (EGD);  Surgeon: Jeryl Columbia, MD;   Location: [facility];  Service: Endoscopy;  Laterality: N/A;    FAMILY HISTORY Family History  Problem Relation Age of Onset  . Cancer Father   The patient's father died from complications of chronic lymphocytic leukemia at the age of 19.  It had been diagnosed seven years before when he was 45.  The patient is enrolled in a familial chronic lymphocytic leukemia study out of the [employer].  The patient's mother is 74, alive, unfortunately suffering with dementia, and he has a brother, 4, who is otherwise in fair health.   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  His wife of >40 years, Timothy [name], is a homemaker.  Their daughter, Timothy [name], lives in [place].  She also is a Agricultural engineer.  The patient has an 47 year old grandson and an 88-year-old granddaughter, and that is really the main reason he moved to this area.  He is a Tourist information centre manager.     ADVANCED DIRECTIVES: In place  HEALTH MAINTENANCE: (Updated 04/13/2014) Social History  Substance Use Topics  . Smoking status: Never Smoker   . Smokeless tobacco: Never Used  . Alcohol Use: No     Colonoscopy: Nov 2013, Dr. Watt Climes  PSA: Not on file  Bone density:  Feb 2014;  Patient also has known insufficiency and pathologic fractures in addition to his long-standing history of steroid use.  Lipid panel: Jan 2015, elevated    Allergies  Allergen Reactions  . Benadryl [Diphenhydramine Hcl]     "Restless leg syndrome"    Current Outpatient Prescriptions  Medication Sig Dispense Refill  . acyclovir (ZOVIRAX) 400 MG tablet TAKE 2 TABLETS BY MOUTH TWICE DAILY 120 tablet 1  . budesonide (ENTOCORT EC) 3 MG 24 hr capsule TAKE 1 CAPSULE BY MOUTH 3 TIMES DAILY 90 capsule 6  . cholestyramine (QUESTRAN) 4 G packet  Take 1 packet (4 g total) by mouth 2 (two) times daily. 60 each 12  . diltiazem (CARDIZEM CD) 240 MG 24 hr capsule TAKE 1 CAPSULE BY MOUTH ONCE DAILY 30 capsule 3  . fluconazole (DIFLUCAN) 100 MG tablet TAKE 1 TABLET BY MOUTH ONCE DAILY 30 tablet 3  . furosemide (LASIX) 40 MG tablet Take 2 tablets (80 mg total) by mouth 2 (two) times daily. 60 tablet 2  . insulin aspart (NOVOLOG) 100 unit/mL injection Inject 12-20 Units into the skin. Sliding scale    . Insulin Pen Needle (B-D UF III MINI PEN NEEDLES) 31G X 5 MM MISC Use five daily with insulin as directed. 100 each 5  . labetalol (NORMODYNE) 200 MG tablet Take 2 tablets (400 mg total) by mouth 2 (two) times daily. 120 tablet 1  . lidocaine-prilocaine (EMLA) cream Apply topically to port-a-cath 1-2 hours prior to each port access. 30 g 3  . lisinopril (PRINIVIL,ZESTRIL) 10 MG tablet Take 10 mg by mouth daily.     Marland Kitchen  loratadine (CLARITIN) 10 MG tablet Take 10 mg by mouth daily.    Marland Kitchen NOVOLOG FLEXPEN 100 UNIT/ML FlexPen     . omeprazole (PRILOSEC) 20 MG capsule Take 1 capsule (20 mg total) by mouth daily. 30 capsule 5  . ONE TOUCH ULTRA TEST test strip TEST BEFORE MEALS AND AT BEDTIME. 300 each 2  . ONETOUCH DELICA LANCETS 84T MISC USE TO TEST BEFORE MEALS AND AT BEDTIME 300 each 0  . predniSONE (DELTASONE) 5 MG tablet Take 1 tablet (5 mg total) by mouth daily with breakfast. Pt is to alternate days with 7.5 mg  and 5 mg. 60 tablet 2  . pseudoephedrine (SUDAFED) 60 MG tablet Take 1 tablet (60 mg total) by mouth every 4 (four) hours as needed for congestion. 30 tablet 0  . sertraline (ZOLOFT) 50 MG tablet Take 1 tablet (50 mg total) by mouth every morning. 90 tablet 5  . sodium bicarbonate 650 MG tablet Take 650 mg by mouth daily.    Marland Kitchen sulfamethoxazole-trimethoprim (BACTRIM DS,SEPTRA DS) 800-160 MG per tablet Take 1 tablet by mouth daily. 30 tablet 5  . sulfamethoxazole-trimethoprim (BACTRIM DS,SEPTRA DS) 800-160 MG tablet TAKE 1 TABLET BY MOUTH ONCE  DAILY 30 tablet 5  . tacrolimus (PROGRAF) 0.5 MG capsule TAKE 3 CAPSULES BY MOUTH TWICE DAILY 180 capsule 4  . TRESIBA FLEXTOUCH 200 UNIT/ML SOPN 50 Units daily.     . VOLTAREN 1 % GEL Apply 2 g topically 2 (two) times daily. Applied to back     No current facility-administered medications for this visit.   Facility-Administered Medications Ordered in Other Visits  Medication Dose Route Frequency Provider Last Rate Last Dose  . 0.9 %  sodium chloride infusion   Intravenous Continuous Amy G Berry, PA-C 500 mL/hr at 03/12/13 0900    . sodium chloride 0.9 % injection 10 mL  10 mL Intravenous PRN Chauncey Cruel, MD   10 mL at 08/11/12 1606    OBJECTIVE: Middle-aged white man in no acute distress Filed Vitals:   10/31/15 0853  BP: 146/76  Pulse: 74  Temp: 97.7 F (36.5 C)  Resp: 18  Body mass index is 35.75 kg/(m^2).  ECOG: 1 Filed Weights   10/31/15 0853  Weight: 221 lb 6.4 oz (100.426 kg)    Sclerae unicteric, EOMs intact Oropharynx clear, no thrush or other lesions No cervical or supraclavicular adenopathy Lungs no rales or rhonchi Heart regular rate and rhythm Abd soft, obese, nontender, positive bowel sounds MSK some kyphosis but no focal spinal tenderness Neuro: nonfocal, well oriented, appropriate affect Skin: Ecchymoses but no rash.   LABS:  CBC    Component Value Date/Time   WBC 5.9 10/31/2015 0839   WBC 3.2* 02/27/2015 0805   RBC 3.88* 10/31/2015 0839   RBC 3.64* 02/27/2015 0805   RBC 3.82* 03/16/2013 1400   HGB 11.4* 10/31/2015 0839   HGB 11.3* 02/27/2015 0805   HCT 35.7* 10/31/2015 0839   HCT 34.1* 02/27/2015 0805   PLT 118* 10/31/2015 0839   PLT 120* 02/27/2015 0805   MCV 92.0 10/31/2015 0839   MCV 93.7 02/27/2015 0805   MCH 29.4 10/31/2015 0839   MCH 31.0 02/27/2015 0805   MCHC 31.9* 10/31/2015 0839   MCHC 33.1 02/27/2015 0805   RDW 18.1* 10/31/2015 0839   RDW 16.2* 02/27/2015 0805   LYMPHSABS 1.5 10/31/2015 0839   LYMPHSABS 1.4 03/18/2013  0615   MONOABS 0.4 10/31/2015 0839   MONOABS 0.3 03/18/2013 0615   EOSABS 0.0 10/31/2015  0839   EOSABS 0.0 03/18/2013 0615   BASOSABS 0.0 10/31/2015 0839   BASOSABS 0.0 03/18/2013 0615       Chemistry      Component Value Date/Time   NA 144 10/24/2015 0905   NA 139 04/05/2015 0911   K 4.6 10/24/2015 0905   K 4.6 04/05/2015 0911   CL 110 04/05/2015 0911   CL 101 06/15/2013 1034   CO2 16* 10/24/2015 0905   CO2 22 04/05/2015 0911   BUN 86.9* 10/24/2015 0905   BUN 45* 04/05/2015 0911   CREATININE 2.3* 10/24/2015 0905   CREATININE 1.23 04/05/2015 0911      Component Value Date/Time   CALCIUM 7.6* 10/24/2015 0905   CALCIUM 8.4 04/05/2015 0911   ALKPHOS 73 10/24/2015 0905   ALKPHOS 108 04/05/2015 0911   AST 34 10/24/2015 0905   AST 50* 04/05/2015 0911   ALT 41 10/24/2015 0905   ALT 59* 04/05/2015 0911   BILITOT <0.30 10/24/2015 0905   BILITOT 0.5 04/05/2015 0911     STUDIES: No results found.   ASSESSMENT: 69 y.o. [place] man with a history of well-differentiated lymphocytic lymphoma/ chronic lymphoid leukemia initially diagnosed in 2000, not requiring intervention until 2006; with multiple chromosomal abnormalities.  His treatment history is as follows:  (1) fludarabine/cyclophosphamide/rituximab x5 completed May 2007.   (2) rituximab for 8 doses October 2010, with partial response   (3) Leustatin and ofatumumab weekly x8 July to September 2011 followed by maintenance ofatumumab  every 2 months, with initial response but rising counts September 2012   (4) status-post unrelated donor stem-cell transplant 02/24/2012 at [facility]  (a) conditioning regimen consisted of fludarabine + TBI at 200 cGy, followed by rituximab x27;  (b) CMV reactivation x3 (patient CMV positive, donor negative), s/p ganciclovir treatment; 3d reactivation August 2013, s/p gancyclovir, with negative PCR mid-September 2013; last gancyclovir dose 10/06/2012 (c) Chronic GVHD: involving gut and  skin, treated with steroids, tacrolimus and MMF.  MMF was eventually d/c'd and tacrolimus currently at a dose of 1.58m BID (d) atrial fibrillation: resolved on brief amiodarone regimen (e) steroid-induced myopathy: improving  (f) hypomagnesemia: improved after d/c gancyclovir, needs continuing support (g) hypogammaglobulinemia: requiring IVIG most recently 08/03/2014. (h) history of elevated triglycerides (606 on 07/14/2012)  (i) adrenal insufficiency: on prednisone and budesonide (j) pancytopenia,resolved (k) brief episode of neutropenia ([id number]) February 2015, accompanied by diarrhea; resolved   (5) restaging studies February-March 2015 including CT scans, flow cytometry, and bone marrow biopsy, showed no evidence of residual chronic lymphoid leukemia.  (a) repeat bone marrow biopsy 02/27/2015 showed no evidence of chronic lymphoid leukemia and also no dyspoiesis. Flow cytometry showed no B cells.  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 Status post kyphoplasty at L3-4 in June 2014. Also with evidence of rib fractures and insufficiency fractures bilaterally of the sacral  alae, noted by CT in March 2015. --   Denosumab started 12/08/2013, given as prolia Q6 months which is what has been approved by his insurance, most recent dose 03/29/2015  (12) chronic back pain and hip pain  controlled with OxyContin and hydrocodone/APAP.  (12) nausea: well controlled on current  meds  (13)  Positive c.diff, 03/08/2013, on Flagyl 500 mg TID x 20 days, then on oral vanco with Questran, showing improvement; positive when repeated April 2014; Negative x 3 since then  (14) persistently increased BUN and potassium: Improved on daily bicarbonate  (15)  Hypertension, on labetalol, cardizem, lisinopril, and furosemide; managed by Dr. Brigitte Pulse  (16) steroid induced hyperglycemia/ DM II: managed by Dr Brigitte Pulse   (17) hypogammaglobulinemia-- requiring intermittent supplementation, most recent dose 10/18/2015  (18) squamous cell CA in situ removed from left parietal scalp October 2014  PLAN: Carries chronic graft-versus-host disease is stable. I think he would do better if he took the Questran, but he simply the tests that medication. It works best if he makes visit with orange juice, but then he has to worry about the potassium issue. We discussed several alternatives. I am encouraging him to use the Questran twice daily. Especially at night it should take care of the nighttime bowel movement which is so unpleasant for him.  I am also strongly encouraged him to intensify his exercise program.  His blood sugar today was greater than 300, and this is accompanied by acidosis. He confessed to dietary indiscretions related to the recent election: He stayed up all night watching and was snacking. Also he did not use his insulin this morning. I suggested he contact Dr. Trena Platt office and in the meantime check his blood sugar before breakfast and supper one day and before lunch and bedtime the next day and continue getting those numbers with any modifications that are needed until his diabetes is under better control.  His next appointment here is December 7. We will continue the fluid support; magnesium/and lab work every 2 weeks, with visits monthly. He knows to call for any problems that may develop before his next visit here.   Chauncey Cruel, MD 10/31/2015

## 2015-10-31 NOTE — Patient Instructions (Signed)
Hypomagnesemia °Hypomagnesemia is a condition in which the level of magnesium in the blood is low. Magnesium is a mineral that is found in many foods. It is used in many different processes in the body. Hypomagnesemia can affect every organ in the body. It can cause life-threatening problems. °CAUSES °Causes of hypomagnesemia include: °· Not getting enough magnesium in your diet. °· Malnutrition. °· Problems with absorbing magnesium from the intestines. °· Dehydration. °· Alcohol abuse. °· Vomiting. °· Severe diarrhea. °· Some medicines, including medicines that make you urinate more. °· Certain diseases, such as kidney disease, diabetes, and overactive thyroid. °SIGNS AND SYMPTOMS °· Involuntary shaking or trembling of a body part (tremor). °· Confusion. °· Muscle weakness. °· Sensitivity to light, sound, and touch. °· Psychiatric issues, such as depression, irritability, or psychosis. °· Sudden tightening of muscles (muscle spasms). °· Tingling in the arms and legs. °· A feeling of fluttering of the heart. °These symptoms are more severe if magnesium levels drop suddenly. °DIAGNOSIS °To make a diagnosis, your health care provider will do a physical exam and order blood and urine tests. °TREATMENT °Treatment will depend on the cause and the severity of your condition. It may involve: °· A magnesium supplement. This can be taken in pill form. It can also be given through an IV tube. This is usually done if the condition is severe. °· Changes to your diet. You may be directed to eat foods that have a lot of magnesium, such as green leafy vegetables, peas, beans, and nuts. °· Eliminating alcohol from your diet. °HOME CARE INSTRUCTIONS °· Include foods with magnesium in your diet. Foods that are rich in magnesium include green vegetables, beans, nuts and seeds, and whole grains. °· Take medicines only as directed by your health care provider. °· Take magnesium supplements if your health care provider instructs you to  do that. Take them as directed. °· Have your magnesium levels monitored as directed by your health care provider. °· When you are active, drink fluids that contain electrolytes. °· Keep all follow-up visits as directed by your health care provider. This is important. °SEEK MEDICAL CARE IF: °· You get worse instead of better. °· Your symptoms return. °SEEK IMMEDIATE MEDICAL CARE IF: °· Your symptoms are severe. °  °This information is not intended to replace advice given to you by your health care provider. Make sure you discuss any questions you have with your health care provider. °  °Document Released: 09/03/2005 Document Revised: 12/29/2014 Document Reviewed: 07/24/2014 °Elsevier Interactive Patient Education ©2016 [employer] ° °

## 2015-11-02 ENCOUNTER — Telehealth: Payer: Self-pay | Admitting: Oncology

## 2015-11-02 NOTE — Telephone Encounter (Signed)
Extended appointments added per pof and patient will get new avs at next 11/22 appointment as well with mychart

## 2015-11-05 ENCOUNTER — Telehealth: Payer: Self-pay | Admitting: Oncology

## 2015-11-05 ENCOUNTER — Telehealth: Payer: Self-pay

## 2015-11-05 ENCOUNTER — Other Ambulatory Visit: Payer: Self-pay

## 2015-11-05 ENCOUNTER — Telehealth: Payer: Self-pay | Admitting: *Deleted

## 2015-11-05 MED ORDER — PROCHLORPERAZINE MALEATE 10 MG PO TABS: 10.0000 mg | ORAL_TABLET | Freq: Four times a day (QID) | ORAL | Status: DC | PRN

## 2015-11-05 MED ORDER — ONDANSETRON 8 MG PO TBDP: 8.0000 mg | ORAL_TABLET | Freq: Three times a day (TID) | ORAL | Status: DC | PRN

## 2015-11-05 NOTE — Telephone Encounter (Signed)
Aware of 10:15 11/16 lab

## 2015-11-05 NOTE — Telephone Encounter (Signed)
Patient called requesting a call back regarding his schedule.  He would like to be put in for weekly labs and is requesting for a Wednesday lab appt.  POF entered for that appt.  He stated that Val, RN is working on a fluid appt for him for tomorrow.

## 2015-11-05 NOTE — Telephone Encounter (Signed)
Call received in [place] from [place] stating onset of vomiting x 4 today with 1 bout of diarrhea.  Pt presently does not have any anti nausea medications in home.  Per chart review obtained prior medication refills for compazine and zofran.  Prescriptions sent to pharmacy and [name] made aware.  [name] will call in the am with update.

## 2015-11-07 ENCOUNTER — Other Ambulatory Visit (HOSPITAL_BASED_OUTPATIENT_CLINIC_OR_DEPARTMENT_OTHER): Payer: BC Managed Care – PPO

## 2015-11-07 DIAGNOSIS — C9110 Chronic lymphocytic leukemia of B-cell type not having achieved remission: Secondary | ICD-10-CM

## 2015-11-07 LAB — COMPREHENSIVE METABOLIC PANEL (CC13)
ALT: 56 U/L — AB (ref 0–55)
AST: 38 U/L — AB (ref 5–34)
Albumin: 3.1 g/dL — ABNORMAL LOW (ref 3.5–5.0)
Alkaline Phosphatase: 71 U/L (ref 40–150)
Anion Gap: 12 mEq/L — ABNORMAL HIGH (ref 3–11)
BUN: 87.9 mg/dL — AB (ref 7.0–26.0)
CHLORIDE: 117 meq/L — AB (ref 98–109)
CO2: 13 meq/L — AB (ref 22–29)
CREATININE: 2.6 mg/dL — AB (ref 0.7–1.3)
Calcium: 7.2 mg/dL — ABNORMAL LOW (ref 8.4–10.4)
EGFR: 25 mL/min/{1.73_m2} — ABNORMAL LOW (ref >90)
Glucose: 88 mg/dl (ref 70–140)
Potassium: 5.3 mEq/L — ABNORMAL HIGH (ref 3.5–5.1)
Sodium: 141 mEq/L (ref 136–145)
Total Bilirubin: <0.3 mg/dL (ref 0.20–1.20)
Total Protein: 5.8 g/dL — ABNORMAL LOW (ref 6.4–8.3)

## 2015-11-07 LAB — CBC WITH DIFFERENTIAL/PLATELET
BASO%: 0.8 % (ref 0.0–2.0)
Basophils Absolute: 0.1 10*3/uL (ref 0.0–0.1)
EOS%: 0.3 % (ref 0.0–7.0)
Eosinophils Absolute: 0 10*3/uL (ref 0.0–0.5)
HCT: 38.6 % (ref 38.4–49.9)
HGB: 12.2 g/dL — ABNORMAL LOW (ref 13.0–17.1)
LYMPH#: 1.9 10*3/uL (ref 0.9–3.3)
LYMPH%: 26.6 % (ref 14.0–49.0)
MCH: 29.1 pg (ref 27.2–33.4)
MCHC: 31.6 g/dL — AB (ref 32.0–36.0)
MCV: 92.2 fL (ref 79.3–98.0)
MONO#: 0.3 10*3/uL (ref 0.1–0.9)
MONO%: 4.8 % (ref 0.0–14.0)
NEUT#: 4.8 10*3/uL (ref 1.5–6.5)
NEUT%: 67.5 % (ref 39.0–75.0)
Platelets: 136 10*3/uL — ABNORMAL LOW (ref 140–400)
RBC: 4.19 10*6/uL — AB (ref 4.20–5.82)
RDW: 19 % — ABNORMAL HIGH (ref 11.0–14.6)
WBC: 7.1 10*3/uL (ref 4.0–10.3)

## 2015-11-07 LAB — MAGNESIUM (CC13): Magnesium: 1.9 mg/dl (ref 1.5–2.5)

## 2015-11-12 ENCOUNTER — Other Ambulatory Visit: Payer: Self-pay | Admitting: Nurse Practitioner

## 2015-11-12 ENCOUNTER — Other Ambulatory Visit: Payer: Self-pay | Admitting: Oncology

## 2015-11-12 MED ORDER — FUROSEMIDE 40 MG PO TABS: 80.0000 mg | ORAL_TABLET | Freq: Two times a day (BID) | ORAL | Status: DC

## 2015-11-13 ENCOUNTER — Other Ambulatory Visit (HOSPITAL_BASED_OUTPATIENT_CLINIC_OR_DEPARTMENT_OTHER): Payer: BC Managed Care – PPO

## 2015-11-13 ENCOUNTER — Ambulatory Visit (HOSPITAL_BASED_OUTPATIENT_CLINIC_OR_DEPARTMENT_OTHER): Payer: BC Managed Care – PPO

## 2015-11-13 DIAGNOSIS — C9110 Chronic lymphocytic leukemia of B-cell type not having achieved remission: Secondary | ICD-10-CM

## 2015-11-13 LAB — COMPREHENSIVE METABOLIC PANEL (CC13)
ALBUMIN: 3.4 g/dL — AB (ref 3.5–5.0)
ALT: 31 U/L (ref 0–55)
AST: 30 U/L (ref 5–34)
Alkaline Phosphatase: 78 U/L (ref 40–150)
Anion Gap: 11 mEq/L (ref 3–11)
BILIRUBIN TOTAL: 0.3 mg/dL (ref 0.20–1.20)
BUN: 85.5 mg/dL — AB (ref 7.0–26.0)
CALCIUM: 6.8 mg/dL — AB (ref 8.4–10.4)
CO2: 14 meq/L — AB (ref 22–29)
CREATININE: 2.9 mg/dL — AB (ref 0.7–1.3)
Chloride: 117 mEq/L — ABNORMAL HIGH (ref 98–109)
EGFR: 21 mL/min/{1.73_m2} — ABNORMAL LOW (ref >90)
GLUCOSE: 124 mg/dL (ref 70–140)
Potassium: 5.3 mEq/L — ABNORMAL HIGH (ref 3.5–5.1)
Sodium: 142 mEq/L (ref 136–145)
TOTAL PROTEIN: 5.9 g/dL — AB (ref 6.4–8.3)

## 2015-11-13 LAB — CBC WITH DIFFERENTIAL/PLATELET
BASO%: 1.5 % (ref 0.0–2.0)
BASOS ABS: 0.1 10*3/uL (ref 0.0–0.1)
EOS ABS: 0 10*3/uL (ref 0.0–0.5)
EOS%: 0.1 % (ref 0.0–7.0)
HEMATOCRIT: 37 % — AB (ref 38.4–49.9)
HEMOGLOBIN: 12 g/dL — AB (ref 13.0–17.1)
LYMPH#: 1.6 10*3/uL (ref 0.9–3.3)
LYMPH%: 22.8 % (ref 14.0–49.0)
MCH: 29.5 pg (ref 27.2–33.4)
MCHC: 32.4 g/dL (ref 32.0–36.0)
MCV: 91.3 fL (ref 79.3–98.0)
MONO#: 0.3 10*3/uL (ref 0.1–0.9)
MONO%: 4.6 % (ref 0.0–14.0)
NEUT%: 71 % (ref 39.0–75.0)
NEUTROS ABS: 4.9 10*3/uL (ref 1.5–6.5)
PLATELETS: 133 10*3/uL — AB (ref 140–400)
RBC: 4.05 10*6/uL — ABNORMAL LOW (ref 4.20–5.82)
RDW: 18.2 % — AB (ref 11.0–14.6)
WBC: 6.9 10*3/uL (ref 4.0–10.3)

## 2015-11-13 LAB — MAGNESIUM (CC13): Magnesium: 1.8 mg/dl (ref 1.5–2.5)

## 2015-11-13 MED ORDER — HEPARIN SOD (PORK) LOCK FLUSH 100 UNIT/ML IV SOLN
500.0000 [IU] | Freq: Once | INTRAVENOUS | Status: AC
  Administered 2015-11-13: 500 [IU] via INTRAVENOUS
  Filled 2015-11-13: qty 5

## 2015-11-13 MED ORDER — SODIUM CHLORIDE 0.9 % IV SOLN
Freq: Once | INTRAVENOUS | Status: AC
  Administered 2015-11-13: 09:00:00 via INTRAVENOUS
  Filled 2015-11-13: qty 500

## 2015-11-13 MED ORDER — SODIUM CHLORIDE 0.9 % IJ SOLN
10.0000 mL | INTRAMUSCULAR | Status: DC | PRN
  Administered 2015-11-13: 10 mL via INTRAVENOUS
  Filled 2015-11-13: qty 10

## 2015-11-13 NOTE — Progress Notes (Signed)
0900: Per Dr. Jana Hakim, no need to wait on labs to begin infusion.  Potassium 5.3, Creatinine 2.9, Dr. Jana Hakim aware. No new orders at this time.

## 2015-11-13 NOTE — Patient Instructions (Signed)
Hypomagnesemia Hypomagnesemia is a condition in which the level of magnesium in the blood is low. Magnesium is a mineral that is found in many foods. It is used in many different processes in the body. Hypomagnesemia can affect every organ in the body. It can cause life-threatening problems. CAUSES Causes of hypomagnesemia include:  Not getting enough magnesium in your diet.  Malnutrition.  Problems with absorbing magnesium from the intestines.  Dehydration.  Alcohol abuse.  Vomiting.  Severe diarrhea.  Some medicines, including medicines that make you urinate more.  Certain diseases, such as kidney disease, diabetes, and overactive thyroid. SIGNS AND SYMPTOMS  Involuntary shaking or trembling of a body part (tremor).  Confusion.  Muscle weakness.  Sensitivity to light, sound, and touch.  Psychiatric issues, such as depression, irritability, or psychosis.  Sudden tightening of muscles (muscle spasms).  Tingling in the arms and legs.  A feeling of fluttering of the heart. These symptoms are more severe if magnesium levels drop suddenly. DIAGNOSIS To make a diagnosis, your health care provider will do a physical exam and order blood and urine tests. TREATMENT Treatment will depend on the cause and the severity of your condition. It may involve:  A magnesium supplement. This can be taken in pill form. It can also be given through an IV tube. This is usually done if the condition is severe.  Changes to your diet. You may be directed to eat foods that have a lot of magnesium, such as green leafy vegetables, peas, beans, and nuts.  Eliminating alcohol from your diet. HOME CARE INSTRUCTIONS  Include foods with magnesium in your diet. Foods that are rich in magnesium include green vegetables, beans, nuts and seeds, and whole grains.  Take medicines only as directed by your health care provider.  Take magnesium supplements if your health care provider instructs you to  do that. Take them as directed.  Have your magnesium levels monitored as directed by your health care provider.  When you are active, drink fluids that contain electrolytes.  Keep all follow-up visits as directed by your health care provider. This is important. SEEK MEDICAL CARE IF:  You get worse instead of better.  Your symptoms return. SEEK IMMEDIATE MEDICAL CARE IF:  Your symptoms are severe.   This information is not intended to replace advice given to you by your health care provider. Make sure you discuss any questions you have with your health care provider.   Document Released: 09/03/2005 Document Revised: 12/29/2014 Document Reviewed: 07/24/2014 Elsevier Interactive Patient Education 2016 Elsevier Inc.   Dehydration, Adult Dehydration is a condition in which you do not have enough fluid or water in your body. It happens when you take in less fluid than you lose. Vital organs such as the kidneys, brain, and heart cannot function without a proper amount of fluids. Any loss of fluids from the body can cause dehydration.  Dehydration can range from mild to severe. This condition should be treated right away to help prevent it from becoming severe. CAUSES  This condition may be caused by:  Vomiting.  Diarrhea.  Excessive sweating, such as when exercising in hot or humid weather.  Not drinking enough fluid during strenuous exercise or during an illness.  Excessive urine output.  Fever.  Certain medicines. RISK FACTORS This condition is more likely to develop in:  People who are taking certain medicines that cause the body to lose excess fluid (diuretics).   People who have a chronic illness, such as diabetes, that  may increase urination.  Older adults.   People who live at high altitudes.   People who participate in endurance sports.  SYMPTOMS  Mild Dehydration  Thirst.  Dry lips.  Slightly dry mouth.  Dry, warm skin. Moderate  Dehydration  Very dry mouth.   Muscle cramps.   Dark urine and decreased urine production.   Decreased tear production.   Headache.   Light-headedness, especially when you stand up from a sitting position.  Severe Dehydration  Changes in skin.   Cold and clammy skin.   Skin does not spring back quickly when lightly pinched and released.   Changes in body fluids.   Extreme thirst.   No tears.   Not able to sweat when body temperature is high, such as in hot weather.   Minimal urine production.   Changes in vital signs.   Rapid, weak pulse (more than 100 beats per minute when you are sitting still).   Rapid breathing.   Low blood pressure.   Other changes.   Sunken eyes.   Cold hands and feet.   Confusion.  Lethargy and difficulty being awakened.  Fainting (syncope).   Short-term weight loss.   Unconsciousness. DIAGNOSIS  This condition may be diagnosed based on your symptoms. You may also have tests to determine how severe your dehydration is. These tests may include:   Urine tests.   Blood tests.  TREATMENT  Treatment for this condition depends on the severity. Mild or moderate dehydration can often be treated at home. Treatment should be started right away. Do not wait until dehydration becomes severe. Severe dehydration needs to be treated at the hospital. Treatment for Mild Dehydration  Drinking plenty of water to replace the fluid you have lost.   Replacing minerals in your blood (electrolytes) that you may have lost.  Treatment for Moderate Dehydration  Consuming oral rehydration solution (ORS). Treatment for Severe Dehydration  Receiving fluid through an IV tube.   Receiving electrolyte solution through a feeding tube that is passed through your nose and into your stomach (nasogastric tube or NG tube).  Correcting any abnormalities in electrolytes. HOME CARE INSTRUCTIONS   Drink enough fluid to keep your  urine clear or pale yellow.   Drink water or fluid slowly by taking small sips. You can also try sucking on ice cubes.  Have food or beverages that contain electrolytes. Examples include bananas and sports drinks.  Take over-the-counter and prescription medicines only as told by your health care provider.   Prepare ORS according to the manufacturer's instructions. Take sips of ORS every 5 minutes until your urine returns to normal.  If you have vomiting or diarrhea, continue to try to drink water, ORS, or both.   If you have diarrhea, avoid:   Beverages that contain caffeine.   Fruit juice.   Milk.   Carbonated soft drinks.  Do not take salt tablets. This can lead to the condition of having too much sodium in your body (hypernatremia).  SEEK MEDICAL CARE IF:  You cannot eat or drink without vomiting.  You have had moderate diarrhea during a period of more than 24 hours.  You have a fever. SEEK IMMEDIATE MEDICAL CARE IF:   You have extreme thirst.  You have severe diarrhea.  You have not urinated in 6-8 hours, or you have urinated only a small amount of very dark urine.  You have shriveled skin.  You are dizzy, confused, or both.   This information is not intended to  replace advice given to you by your health care provider. Make sure you discuss any questions you have with your health care provider.   Document Released: 12/08/2005 Document Revised: 08/29/2015 Document Reviewed: 04/25/2015 Elsevier Interactive Patient Education [employer].

## 2015-11-14 ENCOUNTER — Other Ambulatory Visit: Payer: Self-pay

## 2015-11-14 ENCOUNTER — Ambulatory Visit: Payer: Self-pay

## 2015-11-21 ENCOUNTER — Other Ambulatory Visit: Payer: Self-pay

## 2015-11-28 ENCOUNTER — Encounter: Payer: Self-pay | Admitting: Nurse Practitioner

## 2015-11-28 ENCOUNTER — Other Ambulatory Visit: Payer: Self-pay

## 2015-11-28 ENCOUNTER — Ambulatory Visit: Payer: Self-pay

## 2015-11-28 ENCOUNTER — Other Ambulatory Visit (HOSPITAL_BASED_OUTPATIENT_CLINIC_OR_DEPARTMENT_OTHER): Payer: BC Managed Care – PPO

## 2015-11-28 ENCOUNTER — Other Ambulatory Visit: Payer: Self-pay | Admitting: *Deleted

## 2015-11-28 ENCOUNTER — Ambulatory Visit (HOSPITAL_BASED_OUTPATIENT_CLINIC_OR_DEPARTMENT_OTHER): Payer: BC Managed Care – PPO

## 2015-11-28 ENCOUNTER — Ambulatory Visit (HOSPITAL_BASED_OUTPATIENT_CLINIC_OR_DEPARTMENT_OTHER): Payer: BC Managed Care – PPO | Admitting: Nurse Practitioner

## 2015-11-28 ENCOUNTER — Telehealth: Payer: Self-pay | Admitting: Oncology

## 2015-11-28 VITALS — BP 126/75 | HR 72 | Temp 97.3°F | Resp 18 | Ht 66.0 in | Wt 219.3 lb

## 2015-11-28 DIAGNOSIS — D89811 Chronic graft-versus-host disease: Secondary | ICD-10-CM

## 2015-11-28 DIAGNOSIS — K529 Noninfective gastroenteritis and colitis, unspecified: Secondary | ICD-10-CM | POA: Diagnosis not present

## 2015-11-28 DIAGNOSIS — C9110 Chronic lymphocytic leukemia of B-cell type not having achieved remission: Secondary | ICD-10-CM | POA: Diagnosis not present

## 2015-11-28 DIAGNOSIS — E860 Dehydration: Secondary | ICD-10-CM

## 2015-11-28 DIAGNOSIS — E872 Acidosis, unspecified: Secondary | ICD-10-CM

## 2015-11-28 DIAGNOSIS — T8609 Other complications of bone marrow transplant: Principal | ICD-10-CM

## 2015-11-28 DIAGNOSIS — K449 Diaphragmatic hernia without obstruction or gangrene: Secondary | ICD-10-CM

## 2015-11-28 LAB — CBC WITH DIFFERENTIAL/PLATELET
BASO%: 0.7 % (ref 0.0–2.0)
BASOS ABS: 0 10*3/uL (ref 0.0–0.1)
EOS%: 0.4 % (ref 0.0–7.0)
Eosinophils Absolute: 0 10*3/uL (ref 0.0–0.5)
HCT: 39.1 % (ref 38.4–49.9)
HEMOGLOBIN: 12.5 g/dL — AB (ref 13.0–17.1)
LYMPH%: 26.9 % (ref 14.0–49.0)
MCH: 29.8 pg (ref 27.2–33.4)
MCHC: 32 g/dL (ref 32.0–36.0)
MCV: 93.1 fL (ref 79.3–98.0)
MONO#: 0.5 10*3/uL (ref 0.1–0.9)
MONO%: 7.2 % (ref 0.0–14.0)
NEUT%: 64.8 % (ref 39.0–75.0)
NEUTROS ABS: 4.4 10*3/uL (ref 1.5–6.5)
Platelets: 126 10*3/uL — ABNORMAL LOW (ref 140–400)
RBC: 4.2 10*6/uL (ref 4.20–5.82)
RDW: 17.9 % — AB (ref 11.0–14.6)
WBC: 6.9 10*3/uL (ref 4.0–10.3)
lymph#: 1.9 10*3/uL (ref 0.9–3.3)

## 2015-11-28 LAB — COMPREHENSIVE METABOLIC PANEL
ALT: 31 U/L (ref 0–55)
ANION GAP: 7 meq/L (ref 3–11)
AST: 37 U/L — ABNORMAL HIGH (ref 5–34)
Albumin: 3.6 g/dL (ref 3.5–5.0)
Alkaline Phosphatase: 83 U/L (ref 40–150)
BILIRUBIN TOTAL: 0.36 mg/dL (ref 0.20–1.20)
BUN: 63.2 mg/dL — ABNORMAL HIGH (ref 7.0–26.0)
CALCIUM: 7 mg/dL — AB (ref 8.4–10.4)
CO2: 11 meq/L — AB (ref 22–29)
Chloride: 119 mEq/L — ABNORMAL HIGH (ref 98–109)
Creatinine: 2.3 mg/dL — ABNORMAL HIGH (ref 0.7–1.3)
EGFR: 28 mL/min/{1.73_m2} — AB (ref >90)
Glucose: 81 mg/dl (ref 70–140)
Potassium: 6 mEq/L — ABNORMAL HIGH (ref 3.5–5.1)
Sodium: 137 mEq/L (ref 136–145)
TOTAL PROTEIN: 6.1 g/dL — AB (ref 6.4–8.3)

## 2015-11-28 LAB — MAGNESIUM: MAGNESIUM: 1.6 mg/dL (ref 1.5–2.5)

## 2015-11-28 MED ORDER — ONDANSETRON 8 MG/50ML IVPB (CHCC)
8.0000 mg | Freq: Once | INTRAVENOUS | Status: AC
  Administered 2015-11-28: 8 mg via INTRAVENOUS
  Filled 2015-11-28: qty 8

## 2015-11-28 MED ORDER — HEPARIN SOD (PORK) LOCK FLUSH 100 UNIT/ML IV SOLN
500.0000 [IU] | Freq: Once | INTRAVENOUS | Status: AC
Start: 2015-11-28 — End: 2015-11-28
  Administered 2015-11-28: 500 [IU] via INTRAVENOUS
  Filled 2015-11-28: qty 5

## 2015-11-28 MED ORDER — SODIUM CHLORIDE 0.9 % IV SOLN
Freq: Once | INTRAVENOUS | Status: AC
  Administered 2015-11-28: 14:00:00 via INTRAVENOUS
  Filled 2015-11-28: qty 500

## 2015-11-28 MED ORDER — OMEPRAZOLE 20 MG PO CPDR: 20.0000 mg | DELAYED_RELEASE_CAPSULE | Freq: Every day | ORAL | Status: AC

## 2015-11-28 MED ORDER — ONDANSETRON 8 MG PO TBDP: 8.0000 mg | ORAL_TABLET | Freq: Three times a day (TID) | ORAL | Status: DC | PRN

## 2015-11-28 MED ORDER — PREDNISONE 5 MG PO TABS: 5.0000 mg | ORAL_TABLET | Freq: Every day | ORAL | Status: DC

## 2015-11-28 MED ORDER — SODIUM CHLORIDE 0.9 % IV SOLN
Freq: Once | INTRAVENOUS | Status: AC
  Administered 2015-11-28: 13:00:00 via INTRAVENOUS

## 2015-11-28 MED ORDER — SODIUM CHLORIDE 0.9 % IJ SOLN
10.0000 mL | INTRAMUSCULAR | Status: DC | PRN
  Administered 2015-11-28: 10 mL via INTRAVENOUS
  Filled 2015-11-28: qty 10

## 2015-11-28 NOTE — Progress Notes (Signed)
ID: Timothy [name]   DOB: April 13, 1946  MR#: [id number]  LTJ#:[id number]  QTM:AUQJ, Timothy Saxon, MD SU: OTHER MD: Timothy [name]; Timothy Shin, MD; Timothy [name], Timothy Hatcher,MD; Timothy Rosenthal, MD; [facility] in [place], [place]:  Timothy Duval, RN [phone]), Timothy [name] M.D.  CHIEF COMPLAINT:  CLL, status post allogeneic stem cell transplant, GVHD  CURRENT TREATMENT: Immunosuppression, magnesium, fluids, supportive care  HISTORY OF CLL: From the original intake note:  We have very complete records from Dr. Racheal Patches in [place], and in summary:  The patient was initially diagnosed in August 2000, with a white cell count of 23,600, but normal hemoglobin and platelets, and no significant symptomatology. Over the next several years his white cell count drifted up, and he eventually developed some symptoms of night sweats in particular, leading to treatment with fludarabine, Cytoxan and rituxan for five cycles given between December 2006 and May 2007.  We have CT scans from June 2006, November 2006 and April 2007, and comparing the November 2006 and April 2007 scans, there was near complete response. He had subsequent therapy in [place] as detailed below, but with decreased response, leading to allogeneic stem-cell transplant at the [facility] 02/24/2012.  Subsequent history is as detailed below.  INTERVAL HISTORY: Timothy [name] returns today for continuing follow up of his chronic lymphoid leukemia and graft-versus-host disease, accompanied by his wife Timothy [name]. He is due for his next magnesium infusion today. The interval history is remarkable for catching a "stomach bug" 2 weeks ago, that they both shared. He experienced considerable nausea and vomiting. This is now subsiding with the use of questran powder TID and imodium at least 4 times daily. He is no longer vomiting. This incident has left him weak however. He is short of breath with minimal  exertion and is now using his rolling walker again to get around. His legs have swollen up again and demonstrating pitting edema. He refuses to wear his stockings. His appetite is down and so is his fluid intake. He denies fevers, chills, or night sweats.  REVIEW OF SYSTEMS: A detailed review of systems is otherwise stable, except where noted above.  PAST MEDICAL HISTORY: Past Medical History  Diagnosis Date  . 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 Chronic graft-versus-host disease   . Diverticular disease   . Hyperlipidemia   . Obesity   . Hypertension   . Hiatal hernia   . CMV (cytomegalovirus) antibody positive     pre-transplant, with seroconversion x2 pst-transplant  . Right bundle branch block     pre-transplant  . CKD (chronic kidney disease) stage 2, GFR 60-89 ml/min   . Pancytopenia   . Atrial fibrillation     post-transplant  . Myopathy   . 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 Chronic graft-versus-host disease   . 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 Diarrhea in adult patient 12/05/2012    Due to active GVHD  . Rash of face 12/05/2012    Due to GVHD  . Hypomagnesemia 01/26/2013  . Left hip pain 12/01/2013  . Steroid-induced diabetes     novalog  . Leukemia, chronic lymphoid   . CLL (chronic lymphocytic leukemia) 12/05/2012    Dx 07/1999; started Rx 12/06  AlloBMT 3/13    PAST SURGICAL HISTORY: Past Surgical History  Procedure Laterality Date  . Tonsillectomy and adenoidectomy    . Bone marrow transplant    . Flexible sigmoidoscopy  11/17/2012  Procedure: FLEXIBLE SIGMOIDOSCOPY;  Surgeon: Jeryl Columbia, MD;  Location: [facility];  Service: Endoscopy;  Laterality: N/A;  Dr Watt Climes states will be admitted to rooom 1339 11/16/12  . Esophagogastroduodenoscopy  11/17/2012    Procedure: ESOPHAGOGASTRODUODENOSCOPY (EGD);  Surgeon: Jeryl Columbia, MD;  Location: [facility];  Service: Endoscopy;  Laterality: N/A;    FAMILY  HISTORY Family History  Problem Relation Age of Onset  . Cancer Father   The patient's father died from complications of chronic lymphocytic leukemia at the age of 78.  It had been diagnosed seven years before when he was 3.  The patient is enrolled in a familial chronic lymphocytic leukemia study out of the [employer].  The patient's mother is 43, alive, unfortunately suffering with dementia, and he has a brother, 51, who is otherwise in fair health.   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  His wife of >40 years, Timothy [name], is a homemaker.  Their daughter, Timothy [name], lives in [place].  She also is a Agricultural engineer.  The patient has an 12 year old grandson and an 51-year-old granddaughter, and that is really the main reason he moved to this area.  He is a Tourist information centre manager.     ADVANCED DIRECTIVES: In place  HEALTH MAINTENANCE: (Updated 04/13/2014) Social History  Substance Use Topics  . Smoking status: Never Smoker   . Smokeless tobacco: Never Used  . Alcohol Use: No     Colonoscopy: Nov 2013, Dr. Watt Climes  PSA: Not on file  Bone density:  Feb 2014;  Patient also has known insufficiency and pathologic fractures in addition to his long-standing history of steroid use.  Lipid panel: Jan 2015, elevated    Allergies  Allergen Reactions  . Benadryl [Diphenhydramine Hcl]     "Restless leg syndrome"    Current Outpatient Prescriptions  Medication Sig Dispense Refill  . acyclovir (ZOVIRAX) 400 MG tablet TAKE 2 TABLETS BY MOUTH TWICE DAILY 120 tablet 1  . budesonide (ENTOCORT EC) 3 MG 24 hr capsule TAKE 1 CAPSULE BY MOUTH 3 TIMES DAILY 90 capsule 6  . cholestyramine (QUESTRAN) 4 G packet Take 1 packet (4 g total) by mouth 2 (two) times daily. 60 each 12  .  diclofenac sodium (VOLTAREN) 1 % GEL Apply 2 g topically 2 (two) times daily. 100 g 6  . diltiazem (CARDIZEM CD) 240 MG 24 hr capsule TAKE 1 CAPSULE BY MOUTH ONCE DAILY 30 capsule 3  . fluconazole (DIFLUCAN) 100 MG tablet TAKE 1 TABLET BY MOUTH ONCE DAILY 30 tablet 3  . furosemide (LASIX) 40 MG tablet Take 2 tablets (80 mg total) by mouth 2 (two) times daily. 60 tablet 2  . insulin aspart (NOVOLOG) 100 unit/mL injection Inject 12-20 Units into the skin. Sliding scale    . Insulin Pen Needle (B-D UF III MINI PEN NEEDLES) 31G X 5 MM MISC Use five daily with insulin as directed. 100 each 5  . labetalol (NORMODYNE) 200 MG tablet TAKE 2 TABLETS BY MOUTH TWICE A DAY 120 tablet 1  . lidocaine-prilocaine (EMLA) cream Apply topically to port-a-cath 1-2 hours prior to each port access. 30 g 3  . loratadine (CLARITIN) 10 MG tablet Take 10 mg by mouth daily.    Marland Kitchen NOVOLOG FLEXPEN 100 UNIT/ML FlexPen     . ONE TOUCH  ULTRA TEST test strip TEST BEFORE MEALS AND AT BEDTIME. 300 each 2  . ONETOUCH DELICA LANCETS 96G MISC USE TO TEST BEFORE MEALS AND AT BEDTIME 300 each 0  . pseudoephedrine (SUDAFED) 60 MG tablet Take 1 tablet (60 mg total) by mouth every 4 (four) hours as needed for congestion. 30 tablet 0  . sertraline (ZOLOFT) 50 MG tablet Take 1 tablet (50 mg total) by mouth every morning. 90 tablet 5  . sodium bicarbonate 650 MG tablet Take 650 mg by mouth daily.    Marland Kitchen sulfamethoxazole-trimethoprim (BACTRIM DS,SEPTRA DS) 800-160 MG tablet TAKE 1 TABLET BY MOUTH ONCE DAILY 30 tablet 5  . tacrolimus (PROGRAF) 0.5 MG capsule TAKE 3 CAPSULES BY MOUTH TWICE DAILY 180 capsule 4  . TRESIBA FLEXTOUCH 200 UNIT/ML SOPN 50 Units daily.     . VOLTAREN 1 % GEL Apply 2 g topically 2 (two) times daily. Applied to back    . lisinopril (PRINIVIL,ZESTRIL) 10 MG tablet Take 10 mg by mouth daily.     Marland Kitchen omeprazole (PRILOSEC) 20 MG capsule Take 1 capsule (20 mg total) by mouth daily. 30 capsule 5  . ondansetron (ZOFRAN-ODT) 8 MG  disintegrating tablet Take 1 tablet (8 mg total) by mouth every 8 (eight) hours as needed for nausea or vomiting. 20 tablet 0  . predniSONE (DELTASONE) 5 MG tablet Take 1 tablet (5 mg total) by mouth daily with breakfast. Pt takes 1 1/2 tablets daily totaling 7.5 mg 60 tablet 3  . prochlorperazine (COMPAZINE) 10 MG tablet Take 1 tablet (10 mg total) by mouth every 6 (six) hours as needed for nausea or vomiting. (Patient not taking: Reported on 11/28/2015) 30 tablet 0   No current facility-administered medications for this visit.   Facility-Administered Medications Ordered in Other Visits  Medication Dose Route Frequency Provider Last Rate Last Dose  . 0.9 %  sodium chloride infusion   Intravenous Continuous Amy G Berry, PA-C 500 mL/hr at 03/12/13 0900    . sodium chloride 0.9 % injection 10 mL  10 mL Intravenous PRN Chauncey Cruel, MD   10 mL at 08/11/12 1606  . sodium chloride 0.9 % injection 10 mL  10 mL Intravenous PRN Chauncey Cruel, MD   10 mL at 11/28/15 1449    OBJECTIVE: Middle-aged white man in no acute distress Filed Vitals:   11/28/15 1149  BP: 126/75  Pulse: 72  Temp: 97.3 F (36.3 C)  Resp: 18  Body mass index is 35.41 kg/(m^2).  ECOG: 1 Filed Weights   11/28/15 1149  Weight: 219 lb 4.8 oz (99.474 kg)   Skin: warm, dry, ecchymoses HEENT: sclerae anicteric, conjunctivae pink, oropharynx clear. No thrush or mucositis.  Lymph Nodes: No cervical or supraclavicular lymphadenopathy  Lungs: clear to auscultation bilaterally, no rales, wheezes, or rhonci  Heart: regular rate and rhythm  Abdomen: round, soft, non tender, positive bowel sounds  Musculoskeletal: No focal spinal tenderness, no peripheral edema  Neuro: non focal, well oriented, positive affect    LABS:  CBC    Component Value Date/Time   WBC 6.9 11/28/2015 1136   WBC 3.2* 02/27/2015 0805   RBC 4.20 11/28/2015 1136   RBC 3.64* 02/27/2015 0805   RBC 3.82* 03/16/2013 1400   HGB 12.5* 11/28/2015 1136    HGB 11.3* 02/27/2015 0805   HCT 39.1 11/28/2015 1136   HCT 34.1* 02/27/2015 0805   PLT 126* 11/28/2015 1136   PLT 120* 02/27/2015 0805   MCV 93.1 11/28/2015 1136  MCV 93.7 02/27/2015 0805   MCH 29.8 11/28/2015 1136   MCH 31.0 02/27/2015 0805   MCHC 32.0 11/28/2015 1136   MCHC 33.1 02/27/2015 0805   RDW 17.9* 11/28/2015 1136   RDW 16.2* 02/27/2015 0805   LYMPHSABS 1.9 11/28/2015 1136   LYMPHSABS 1.4 03/18/2013 0615   MONOABS 0.5 11/28/2015 1136   MONOABS 0.3 03/18/2013 0615   EOSABS 0.0 11/28/2015 1136   EOSABS 0.0 03/18/2013 0615   BASOSABS 0.0 11/28/2015 1136   BASOSABS 0.0 03/18/2013 0615       Chemistry      Component Value Date/Time   NA 137 11/28/2015 1136   NA 139 04/05/2015 0911   K 6.0* 11/28/2015 1136   K 4.6 04/05/2015 0911   CL 110 04/05/2015 0911   CL 101 06/15/2013 1034   CO2 11* 11/28/2015 1136   CO2 22 04/05/2015 0911   BUN 63.2* 11/28/2015 1136   BUN 45* 04/05/2015 0911   CREATININE 2.3* 11/28/2015 1136   CREATININE 1.23 04/05/2015 0911      Component Value Date/Time   CALCIUM 7.0* 11/28/2015 1136   CALCIUM 8.4 04/05/2015 0911   ALKPHOS 83 11/28/2015 1136   ALKPHOS 108 04/05/2015 0911   AST 37* 11/28/2015 1136   AST 50* 04/05/2015 0911   ALT 31 11/28/2015 1136   ALT 59* 04/05/2015 0911   BILITOT 0.36 11/28/2015 1136   BILITOT 0.5 04/05/2015 0911     STUDIES: No results found.   ASSESSMENT: 69 y.o. Jessie man with a history of well-differentiated lymphocytic lymphoma/ chronic lymphoid leukemia initially diagnosed in 2000, not requiring intervention until 2006; with multiple chromosomal abnormalities.  His treatment history is as follows:  (1) fludarabine/cyclophosphamide/rituximab x5 completed May 2007.   (2) rituximab for 8 doses October 2010, with partial response   (3) Leustatin and ofatumumab weekly x8 July to September 2011 followed by maintenance ofatumumab  every 2 months, with initial response but rising counts September 2012    (4) status-post unrelated donor stem-cell transplant 02/24/2012 at [facility]  (a) conditioning regimen consisted of fludarabine + TBI at 200 cGy, followed by rituximab x27;  (b) CMV reactivation x3 (patient CMV positive, donor negative), s/p ganciclovir treatment; 3d reactivation August 2013, s/p gancyclovir, with negative PCR mid-September 2013; last gancyclovir dose 10/06/2012 (c) Chronic GVHD: involving gut and skin, treated with steroids, tacrolimus and MMF.  MMF was eventually d/c'd and tacrolimus currently at a dose of 1.31m BID (d) atrial fibrillation: resolved on brief amiodarone regimen (e) steroid-induced myopathy: improving  (f) hypomagnesemia: improved after d/c gancyclovir, needs continuing support (g) hypogammaglobulinemia: requiring IVIG most recently 08/03/2014. (h) history of elevated triglycerides (606 on 07/14/2012)  (i) adrenal insufficiency: on prednisone and budesonide (j) pancytopenia,resolved (k) brief episode of neutropenia ([id number]) February 2015, accompanied by diarrhea; resolved   (5) restaging studies February-March 2015 including CT scans, flow cytometry, and bone marrow biopsy, showed no evidence of residual chronic lymphoid leukemia.  (a) repeat bone marrow biopsy 02/27/2015 showed no evidence of chronic lymphoid leukemia and also no dyspoiesis. Flow cytometry showed no B cells.  (6) recurrent GVHD (skin rash, mouth changes, severe diarrhea and gastric/duodenal/colonic biopsies 11/17/2012 c/w GVHD grade 2) : now grade 1 to inactive  (7)  malnutrition -- on VITAL supplement in addition to regular diet; on Marinol for anorexia  (8) testosterone deficiency--on patch   (9) deconditioning: Especially quad weakness; continuing rehabilitation exercises  (10) mild dehydration: encouraged increased po fluids; receives IVF support w magnesium weekly  (11) severe  steroid-induced osteoporosis with compression fractures: received pamidronate 12/18/2012. Status post  kyphoplasty at L3-4 in June 2014. Also with evidence of rib fractures and insufficiency fractures bilaterally of the sacral  alae, noted by CT in March 2015. --   Denosumab started 12/08/2013, given as prolia Q6 months which is what has been approved by his insurance, most recent dose 03/29/2015  (12) chronic back pain and hip pain controlled with OxyContin and hydrocodone/APAP.  (12) nausea: well controlled on current meds  (13)  Positive c.diff, 03/08/2013, on Flagyl 500 mg TID x 20 days, then on oral vanco with Questran, showing improvement; positive when repeated April 2014; Negative x 3 since then  (14) persistently increased BUN and potassium: Improved on daily bicarbonate  (15)  Hypertension, on labetalol, cardizem, lisinopril, and furosemide; managed by Dr. Brigitte Pulse  (16) steroid induced hyperglycemia/ DM II: managed by Dr Brigitte Pulse   (17) hypogammaglobulinemia-- requiring intermittent supplementation, most recent dose 10/18/2015  (18) squamous cell CA in situ removed from left parietal scalp October 2014  PLAN: Timothy [name] is in poor shape since this "stomach flu" hit. The labs were reviewed and it would seem he is in metabolic acidosis, given the low CO2 level and hyperkalemia. I consulted with Dr. Jana Hakim, and he would like Sierra to go back on 632m sodium bicarb daily. TNoellecan also back off of the questran powder to just twice daily if his bowels are sufficiently controlled.   He will continue on his current dose of steroids, as it was previously controlling his GVHD well, before the stomach flu.   He is holding on to his magnesium level well, despite moving the supplements + IVF to every other week. He will proceed with his next infusion today.   TClarancewill return for follow up with Dr. MJana Hakimnext week. He understands and agrees with this plan. He has been encouraged to call with any issues that might arise before his next visit here.   HLaurie Panda NP 11/28/2015

## 2015-11-28 NOTE — Telephone Encounter (Signed)
Orders notes and scheduled,patient will get a new avs in chemo area

## 2015-11-28 NOTE — Progress Notes (Signed)
Reviewed labs with Nira Conn NP, Heather to infusion to discuss further plan with patient. Okay to discharge pt home after infusion complete.  Pt and VS stable at time of discharge.

## 2015-11-28 NOTE — Patient Instructions (Signed)
Hypomagnesemia °Hypomagnesemia is a condition in which the level of magnesium in the blood is low. Magnesium is a mineral that is found in many foods. It is used in many different processes in the body. Hypomagnesemia can affect every organ in the body. It can cause life-threatening problems. °CAUSES °Causes of hypomagnesemia include: °· Not getting enough magnesium in your diet. °· Malnutrition. °· Problems with absorbing magnesium from the intestines. °· Dehydration. °· Alcohol abuse. °· Vomiting. °· Severe diarrhea. °· Some medicines, including medicines that make you urinate more. °· Certain diseases, such as kidney disease, diabetes, and overactive thyroid. °SIGNS AND SYMPTOMS °· Involuntary shaking or trembling of a body part (tremor). °· Confusion. °· Muscle weakness. °· Sensitivity to light, sound, and touch. °· Psychiatric issues, such as depression, irritability, or psychosis. °· Sudden tightening of muscles (muscle spasms). °· Tingling in the arms and legs. °· A feeling of fluttering of the heart. °These symptoms are more severe if magnesium levels drop suddenly. °DIAGNOSIS °To make a diagnosis, your health care provider will do a physical exam and order blood and urine tests. °TREATMENT °Treatment will depend on the cause and the severity of your condition. It may involve: °· A magnesium supplement. This can be taken in pill form. It can also be given through an IV tube. This is usually done if the condition is severe. °· Changes to your diet. You may be directed to eat foods that have a lot of magnesium, such as green leafy vegetables, peas, beans, and nuts. °· Eliminating alcohol from your diet. °HOME CARE INSTRUCTIONS °· Include foods with magnesium in your diet. Foods that are rich in magnesium include green vegetables, beans, nuts and seeds, and whole grains. °· Take medicines only as directed by your health care provider. °· Take magnesium supplements if your health care provider instructs you to  do that. Take them as directed. °· Have your magnesium levels monitored as directed by your health care provider. °· When you are active, drink fluids that contain electrolytes. °· Keep all follow-up visits as directed by your health care provider. This is important. °SEEK MEDICAL CARE IF: °· You get worse instead of better. °· Your symptoms return. °SEEK IMMEDIATE MEDICAL CARE IF: °· Your symptoms are severe. °  °This information is not intended to replace advice given to you by your health care provider. Make sure you discuss any questions you have with your health care provider. °  °Document Released: 09/03/2005 Document Revised: 12/29/2014 Document Reviewed: 07/24/2014 °Elsevier Interactive Patient Education ©2016 [employer] ° °

## 2015-12-06 ENCOUNTER — Inpatient Hospital Stay (HOSPITAL_COMMUNITY): Payer: BC Managed Care – PPO

## 2015-12-06 ENCOUNTER — Other Ambulatory Visit: Payer: Self-pay | Admitting: *Deleted

## 2015-12-06 ENCOUNTER — Other Ambulatory Visit: Payer: Self-pay | Admitting: Oncology

## 2015-12-06 ENCOUNTER — Encounter (HOSPITAL_COMMUNITY): Payer: Self-pay | Admitting: *Deleted

## 2015-12-06 ENCOUNTER — Ambulatory Visit (HOSPITAL_BASED_OUTPATIENT_CLINIC_OR_DEPARTMENT_OTHER): Payer: Self-pay | Admitting: Oncology

## 2015-12-06 ENCOUNTER — Other Ambulatory Visit (HOSPITAL_BASED_OUTPATIENT_CLINIC_OR_DEPARTMENT_OTHER): Payer: BC Managed Care – PPO

## 2015-12-06 ENCOUNTER — Inpatient Hospital Stay (HOSPITAL_COMMUNITY)
Admission: AD | Admit: 2015-12-06 | Discharge: 2015-12-28 | DRG: 166 | Disposition: A | Discharge status: Home Health | Payer: BC Managed Care – PPO | Source: Ambulatory Visit | Attending: Internal Medicine | Admitting: Internal Medicine

## 2015-12-06 VITALS — BP 138/87 | HR 77 | Resp 18 | Ht 66.0 in | Wt 216.3 lb

## 2015-12-06 DIAGNOSIS — M25559 Pain in unspecified hip: Secondary | ICD-10-CM | POA: Diagnosis present

## 2015-12-06 DIAGNOSIS — E875 Hyperkalemia: Secondary | ICD-10-CM | POA: Diagnosis present

## 2015-12-06 DIAGNOSIS — E785 Hyperlipidemia, unspecified: Secondary | ICD-10-CM

## 2015-12-06 DIAGNOSIS — I4460 Unspecified fascicular block: Secondary | ICD-10-CM | POA: Diagnosis present

## 2015-12-06 DIAGNOSIS — E2740 Unspecified adrenocortical insufficiency: Secondary | ICD-10-CM | POA: Diagnosis present

## 2015-12-06 DIAGNOSIS — R188 Other ascites: Secondary | ICD-10-CM | POA: Diagnosis present

## 2015-12-06 DIAGNOSIS — D6959 Other secondary thrombocytopenia: Secondary | ICD-10-CM | POA: Diagnosis present

## 2015-12-06 DIAGNOSIS — J90 Pleural effusion, not elsewhere classified: Secondary | ICD-10-CM | POA: Diagnosis present

## 2015-12-06 DIAGNOSIS — R894 Abnormal immunological findings in specimens from other organs, systems and tissues: Secondary | ICD-10-CM | POA: Diagnosis not present

## 2015-12-06 DIAGNOSIS — G720 Drug-induced myopathy: Secondary | ICD-10-CM | POA: Diagnosis present

## 2015-12-06 DIAGNOSIS — N179 Acute kidney failure, unspecified: Secondary | ICD-10-CM

## 2015-12-06 DIAGNOSIS — E872 Acidosis: Secondary | ICD-10-CM | POA: Diagnosis present

## 2015-12-06 DIAGNOSIS — Z806 Family history of leukemia: Secondary | ICD-10-CM

## 2015-12-06 DIAGNOSIS — F329 Major depressive disorder, single episode, unspecified: Secondary | ICD-10-CM | POA: Diagnosis present

## 2015-12-06 DIAGNOSIS — Z9489 Other transplanted organ and tissue status: Secondary | ICD-10-CM

## 2015-12-06 DIAGNOSIS — R251 Tremor, unspecified: Secondary | ICD-10-CM | POA: Diagnosis present

## 2015-12-06 DIAGNOSIS — I4510 Unspecified right bundle-branch block: Secondary | ICD-10-CM | POA: Diagnosis present

## 2015-12-06 DIAGNOSIS — I129 Hypertensive chronic kidney disease with stage 1 through stage 4 chronic kidney disease, or unspecified chronic kidney disease: Secondary | ICD-10-CM | POA: Diagnosis present

## 2015-12-06 DIAGNOSIS — C9110 Chronic lymphocytic leukemia of B-cell type not having achieved remission: Secondary | ICD-10-CM

## 2015-12-06 DIAGNOSIS — B259 Cytomegaloviral disease, unspecified: Secondary | ICD-10-CM | POA: Diagnosis present

## 2015-12-06 DIAGNOSIS — N19 Unspecified kidney failure: Secondary | ICD-10-CM

## 2015-12-06 DIAGNOSIS — Z9481 Bone marrow transplant status: Secondary | ICD-10-CM | POA: Diagnosis not present

## 2015-12-06 DIAGNOSIS — D899 Disorder involving the immune mechanism, unspecified: Secondary | ICD-10-CM

## 2015-12-06 DIAGNOSIS — T380X5A Adverse effect of glucocorticoids and synthetic analogues, initial encounter: Secondary | ICD-10-CM | POA: Diagnosis present

## 2015-12-06 DIAGNOSIS — Z888 Allergy status to other drugs, medicaments and biological substances status: Secondary | ICD-10-CM | POA: Diagnosis not present

## 2015-12-06 DIAGNOSIS — K529 Noninfective gastroenteritis and colitis, unspecified: Secondary | ICD-10-CM | POA: Diagnosis present

## 2015-12-06 DIAGNOSIS — D849 Immunodeficiency, unspecified: Secondary | ICD-10-CM

## 2015-12-06 DIAGNOSIS — D89811 Chronic graft-versus-host disease: Secondary | ICD-10-CM | POA: Diagnosis present

## 2015-12-06 DIAGNOSIS — I10 Essential (primary) hypertension: Secondary | ICD-10-CM | POA: Diagnosis not present

## 2015-12-06 DIAGNOSIS — Z9889 Other specified postprocedural states: Secondary | ICD-10-CM

## 2015-12-06 DIAGNOSIS — J189 Pneumonia, unspecified organism: Secondary | ICD-10-CM

## 2015-12-06 DIAGNOSIS — D638 Anemia in other chronic diseases classified elsewhere: Secondary | ICD-10-CM | POA: Diagnosis present

## 2015-12-06 DIAGNOSIS — Z7401 Bed confinement status: Secondary | ICD-10-CM | POA: Diagnosis not present

## 2015-12-06 DIAGNOSIS — J9601 Acute respiratory failure with hypoxia: Secondary | ICD-10-CM | POA: Diagnosis present

## 2015-12-06 DIAGNOSIS — D801 Nonfamilial hypogammaglobulinemia: Secondary | ICD-10-CM | POA: Diagnosis present

## 2015-12-06 DIAGNOSIS — E46 Unspecified protein-calorie malnutrition: Secondary | ICD-10-CM

## 2015-12-06 DIAGNOSIS — J849 Interstitial pulmonary disease, unspecified: Secondary | ICD-10-CM | POA: Diagnosis not present

## 2015-12-06 DIAGNOSIS — M8000XA Age-related osteoporosis with current pathological fracture, unspecified site, initial encounter for fracture: Secondary | ICD-10-CM | POA: Diagnosis present

## 2015-12-06 DIAGNOSIS — T8609 Other complications of bone marrow transplant: Secondary | ICD-10-CM | POA: Diagnosis present

## 2015-12-06 DIAGNOSIS — E11649 Type 2 diabetes mellitus with hypoglycemia without coma: Secondary | ICD-10-CM | POA: Diagnosis present

## 2015-12-06 DIAGNOSIS — R197 Diarrhea, unspecified: Secondary | ICD-10-CM | POA: Diagnosis present

## 2015-12-06 DIAGNOSIS — E099 Drug or chemical induced diabetes mellitus without complications: Secondary | ICD-10-CM | POA: Diagnosis present

## 2015-12-06 DIAGNOSIS — I959 Hypotension, unspecified: Secondary | ICD-10-CM | POA: Diagnosis not present

## 2015-12-06 DIAGNOSIS — E1121 Type 2 diabetes mellitus with diabetic nephropathy: Secondary | ICD-10-CM | POA: Diagnosis present

## 2015-12-06 DIAGNOSIS — T451X5A Adverse effect of antineoplastic and immunosuppressive drugs, initial encounter: Secondary | ICD-10-CM | POA: Diagnosis present

## 2015-12-06 DIAGNOSIS — R918 Other nonspecific abnormal finding of lung field: Secondary | ICD-10-CM

## 2015-12-06 DIAGNOSIS — Z9221 Personal history of antineoplastic chemotherapy: Secondary | ICD-10-CM

## 2015-12-06 DIAGNOSIS — M549 Dorsalgia, unspecified: Secondary | ICD-10-CM | POA: Diagnosis present

## 2015-12-06 DIAGNOSIS — I471 Supraventricular tachycardia: Secondary | ICD-10-CM | POA: Diagnosis not present

## 2015-12-06 DIAGNOSIS — D692 Other nonthrombocytopenic purpura: Secondary | ICD-10-CM | POA: Diagnosis present

## 2015-12-06 DIAGNOSIS — E162 Hypoglycemia, unspecified: Secondary | ICD-10-CM | POA: Diagnosis not present

## 2015-12-06 DIAGNOSIS — Z6834 Body mass index (BMI) 34.0-34.9, adult: Secondary | ICD-10-CM | POA: Diagnosis not present

## 2015-12-06 DIAGNOSIS — D89813 Graft-versus-host disease, unspecified: Secondary | ICD-10-CM | POA: Diagnosis not present

## 2015-12-06 DIAGNOSIS — T8601 Bone marrow transplant rejection: Secondary | ICD-10-CM | POA: Diagnosis not present

## 2015-12-06 DIAGNOSIS — Z9484 Stem cells transplant status: Secondary | ICD-10-CM | POA: Diagnosis not present

## 2015-12-06 DIAGNOSIS — B954 Other streptococcus as the cause of diseases classified elsewhere: Secondary | ICD-10-CM | POA: Diagnosis present

## 2015-12-06 DIAGNOSIS — R0902 Hypoxemia: Secondary | ICD-10-CM | POA: Diagnosis not present

## 2015-12-06 DIAGNOSIS — R001 Bradycardia, unspecified: Secondary | ICD-10-CM | POA: Diagnosis not present

## 2015-12-06 DIAGNOSIS — E876 Hypokalemia: Secondary | ICD-10-CM | POA: Diagnosis not present

## 2015-12-06 DIAGNOSIS — R5381 Other malaise: Secondary | ICD-10-CM | POA: Diagnosis present

## 2015-12-06 DIAGNOSIS — R944 Abnormal results of kidney function studies: Secondary | ICD-10-CM | POA: Diagnosis not present

## 2015-12-06 DIAGNOSIS — C9111 Chronic lymphocytic leukemia of B-cell type in remission: Secondary | ICD-10-CM | POA: Diagnosis present

## 2015-12-06 DIAGNOSIS — N189 Chronic kidney disease, unspecified: Secondary | ICD-10-CM | POA: Diagnosis present

## 2015-12-06 DIAGNOSIS — E860 Dehydration: Secondary | ICD-10-CM

## 2015-12-06 DIAGNOSIS — N184 Chronic kidney disease, stage 4 (severe): Secondary | ICD-10-CM | POA: Diagnosis present

## 2015-12-06 DIAGNOSIS — I4891 Unspecified atrial fibrillation: Secondary | ICD-10-CM | POA: Diagnosis present

## 2015-12-06 DIAGNOSIS — IMO0002 Reserved for concepts with insufficient information to code with codable children: Secondary | ICD-10-CM | POA: Diagnosis present

## 2015-12-06 DIAGNOSIS — J9600 Acute respiratory failure, unspecified whether with hypoxia or hypercapnia: Secondary | ICD-10-CM | POA: Diagnosis not present

## 2015-12-06 DIAGNOSIS — R0602 Shortness of breath: Secondary | ICD-10-CM | POA: Diagnosis present

## 2015-12-06 DIAGNOSIS — G8929 Other chronic pain: Secondary | ICD-10-CM | POA: Diagnosis present

## 2015-12-06 DIAGNOSIS — E1122 Type 2 diabetes mellitus with diabetic chronic kidney disease: Secondary | ICD-10-CM | POA: Diagnosis present

## 2015-12-06 DIAGNOSIS — Y95 Nosocomial condition: Secondary | ICD-10-CM | POA: Diagnosis present

## 2015-12-06 DIAGNOSIS — E0965 Drug or chemical induced diabetes mellitus with hyperglycemia: Secondary | ICD-10-CM | POA: Diagnosis present

## 2015-12-06 DIAGNOSIS — D89812 Acute on chronic graft-versus-host disease: Secondary | ICD-10-CM | POA: Diagnosis present

## 2015-12-06 DIAGNOSIS — E669 Obesity, unspecified: Secondary | ICD-10-CM | POA: Diagnosis present

## 2015-12-06 DIAGNOSIS — Z7952 Long term (current) use of systemic steroids: Secondary | ICD-10-CM | POA: Diagnosis not present

## 2015-12-06 DIAGNOSIS — M818 Other osteoporosis without current pathological fracture: Secondary | ICD-10-CM

## 2015-12-06 DIAGNOSIS — Z8572 Personal history of non-Hodgkin lymphomas: Secondary | ICD-10-CM | POA: Diagnosis not present

## 2015-12-06 DIAGNOSIS — E1165 Type 2 diabetes mellitus with hyperglycemia: Secondary | ICD-10-CM

## 2015-12-06 DIAGNOSIS — R739 Hyperglycemia, unspecified: Secondary | ICD-10-CM | POA: Diagnosis not present

## 2015-12-06 DIAGNOSIS — R768 Other specified abnormal immunological findings in serum: Secondary | ICD-10-CM | POA: Diagnosis present

## 2015-12-06 DIAGNOSIS — Z79899 Other long term (current) drug therapy: Secondary | ICD-10-CM | POA: Diagnosis not present

## 2015-12-06 DIAGNOSIS — R531 Weakness: Secondary | ICD-10-CM

## 2015-12-06 DIAGNOSIS — N182 Chronic kidney disease, stage 2 (mild): Secondary | ICD-10-CM

## 2015-12-06 DIAGNOSIS — G729 Myopathy, unspecified: Secondary | ICD-10-CM | POA: Diagnosis present

## 2015-12-06 DIAGNOSIS — T790XXA Air embolism (traumatic), initial encounter: Secondary | ICD-10-CM

## 2015-12-06 DIAGNOSIS — D61818 Other pancytopenia: Secondary | ICD-10-CM | POA: Diagnosis present

## 2015-12-06 DIAGNOSIS — Z09 Encounter for follow-up examination after completed treatment for conditions other than malignant neoplasm: Secondary | ICD-10-CM

## 2015-12-06 DIAGNOSIS — Z794 Long term (current) use of insulin: Secondary | ICD-10-CM | POA: Diagnosis not present

## 2015-12-06 DIAGNOSIS — J9690 Respiratory failure, unspecified, unspecified whether with hypoxia or hypercapnia: Secondary | ICD-10-CM

## 2015-12-06 LAB — CBC WITH DIFFERENTIAL/PLATELET
BASO%: 0 % (ref 0.0–2.0)
BASOS ABS: 0 10*3/uL (ref 0.0–0.1)
BASOS PCT: 0 %
Basophils Absolute: 0 10*3/uL (ref 0.0–0.1)
EOS ABS: 0 10*3/uL (ref 0.0–0.7)
EOS PCT: 0 %
EOS%: 0 % (ref 0.0–7.0)
Eosinophils Absolute: 0 10*3/uL (ref 0.0–0.5)
HCT: 36.5 % — ABNORMAL LOW (ref 39.0–52.0)
HEMATOCRIT: 39.4 % (ref 38.4–49.9)
HEMOGLOBIN: 13.1 g/dL (ref 13.0–17.1)
HEMOGLOBIN: 12.1 g/dL — AB (ref 13.0–17.0)
LYMPH#: 0.8 10*3/uL — AB (ref 0.9–3.3)
LYMPH%: 12.3 % — ABNORMAL LOW (ref 14.0–49.0)
Lymphocytes Relative: 14 %
Lymphs Abs: 0.7 10*3/uL (ref 0.7–4.0)
MCH: 30.1 pg (ref 27.2–33.4)
MCH: 30.3 pg (ref 26.0–34.0)
MCHC: 33.2 g/dL (ref 32.0–36.0)
MCHC: 33.2 g/dL (ref 30.0–36.0)
MCV: 90.6 fL (ref 79.3–98.0)
MCV: 91.5 fL (ref 78.0–100.0)
MONO#: 0.2 10*3/uL (ref 0.1–0.9)
MONO%: 2.9 % (ref 0.0–14.0)
MONOS PCT: 4 %
Monocytes Absolute: 0.2 10*3/uL (ref 0.1–1.0)
NEUT#: 5.7 10*3/uL (ref 1.5–6.5)
NEUT%: 84.8 % — AB (ref 39.0–75.0)
NEUTROS PCT: 82 %
Neutro Abs: 4.4 10*3/uL (ref 1.7–7.7)
PLATELETS: 107 10*3/uL — AB (ref 140–400)
PLATELETS: 120 10*3/uL — AB (ref 150–400)
RBC: 4.35 10*6/uL (ref 4.20–5.82)
RBC: 3.99 MIL/uL — AB (ref 4.22–5.81)
RDW: 17.4 % — AB (ref 11.0–14.6)
RDW: 17.4 % — ABNORMAL HIGH (ref 11.5–15.5)
WBC: 6.7 10*3/uL (ref 4.0–10.3)
WBC: 5.4 10*3/uL (ref 4.0–10.5)

## 2015-12-06 LAB — COMPREHENSIVE METABOLIC PANEL
ALBUMIN: 3.6 g/dL (ref 3.5–5.0)
ALK PHOS: 83 U/L (ref 38–126)
ALT: 34 U/L (ref 0–55)
ALT: 29 U/L (ref 17–63)
AST: 35 U/L — AB (ref 5–34)
AST: 35 U/L (ref 15–41)
Albumin: 3.5 g/dL (ref 3.5–5.0)
Alkaline Phosphatase: 92 U/L (ref 40–150)
Anion Gap: 13 mEq/L — ABNORMAL HIGH (ref 3–11)
Anion gap: 11 (ref 5–15)
BUN: 82.9 mg/dL — AB (ref 7.0–26.0)
BUN: 82 mg/dL — ABNORMAL HIGH (ref 6–20)
CHLORIDE: 117 meq/L — AB (ref 98–109)
CHLORIDE: 118 mmol/L — AB (ref 101–111)
CO2: 10 mEq/L — ABNORMAL LOW (ref 22–29)
CO2: 12 mmol/L — ABNORMAL LOW (ref 22–32)
Calcium: 6.1 mg/dL — CL (ref 8.4–10.4)
Calcium: 5.8 mg/dL — CL (ref 8.9–10.3)
Creatinine, Ser: 3.77 mg/dL — ABNORMAL HIGH (ref 0.61–1.24)
Creatinine: 3.7 mg/dL (ref 0.7–1.3)
EGFR: 16 mL/min/{1.73_m2} — ABNORMAL LOW (ref >90)
GFR, EST AFRICAN AMERICAN: 17 mL/min — AB (ref >60)
GFR, EST NON AFRICAN AMERICAN: 15 mL/min — AB (ref >60)
GLUCOSE: 40 mg/dL — AB (ref 70–140)
Glucose, Bld: 151 mg/dL — ABNORMAL HIGH (ref 65–99)
POTASSIUM: 5 meq/L (ref 3.5–5.1)
Potassium: 4.8 mmol/L (ref 3.5–5.1)
SODIUM: 140 meq/L (ref 136–145)
Sodium: 141 mmol/L (ref 135–145)
TOTAL PROTEIN: 5.7 g/dL — AB (ref 6.5–8.1)
Total Bilirubin: 0.43 mg/dL (ref 0.20–1.20)
Total Bilirubin: 0.4 mg/dL (ref 0.3–1.2)
Total Protein: 6 g/dL — ABNORMAL LOW (ref 6.4–8.3)

## 2015-12-06 LAB — MAGNESIUM
MAGNESIUM: 1.3 mg/dL — AB (ref 1.7–2.4)
Magnesium: 1.4 mg/dl — CL (ref 1.5–2.5)

## 2015-12-06 LAB — GLUCOSE, CAPILLARY: Glucose-Capillary: 76 mg/dL (ref 65–99)

## 2015-12-06 LAB — WHOLE BLOOD GLUCOSE
Glucose: 68 mg/dL — ABNORMAL LOW (ref 70–100)
HRS PC: 0 Hours

## 2015-12-06 LAB — TROPONIN I
TROPONIN I: 0.04 ng/mL — AB (ref <0.031)
Troponin I: 0.05 ng/mL — ABNORMAL HIGH (ref <0.031)

## 2015-12-06 LAB — PHOSPHORUS: Phosphorus: 5.7 mg/dL — ABNORMAL HIGH (ref 2.5–4.6)

## 2015-12-06 LAB — TSH: TSH: 0.382 u[IU]/mL (ref 0.350–4.500)

## 2015-12-06 MED ORDER — SERTRALINE HCL 50 MG PO TABS
50.0000 mg | ORAL_TABLET | Freq: Every morning | ORAL | Status: DC
  Administered 2015-12-06 – 2015-12-28 (×23): 50 mg via ORAL
  Filled 2015-12-06 (×24): qty 1

## 2015-12-06 MED ORDER — FUROSEMIDE 40 MG PO TABS
80.0000 mg | ORAL_TABLET | Freq: Two times a day (BID) | ORAL | Status: DC
  Administered 2015-12-06 – 2015-12-08 (×4): 80 mg via ORAL
  Filled 2015-12-06 (×4): qty 2

## 2015-12-06 MED ORDER — DEXTROSE-NACL 5-0.45 % IV SOLN
INTRAVENOUS | Status: DC
Start: 2015-12-06 — End: 2015-12-06
  Administered 2015-12-06: 13:00:00 via INTRAVENOUS

## 2015-12-06 MED ORDER — DEXTROSE-NACL 5-0.45 % IV SOLN
INTRAVENOUS | Status: DC
  Administered 2015-12-06: 18:00:00 via INTRAVENOUS

## 2015-12-06 MED ORDER — PREDNISONE 20 MG PO TABS
20.0000 mg | ORAL_TABLET | Freq: Every day | ORAL | Status: DC
Start: 2015-12-06 — End: 2015-12-07
  Filled 2015-12-06: qty 1

## 2015-12-06 MED ORDER — ENOXAPARIN SODIUM 30 MG/0.3ML ~~LOC~~ SOLN
30.0000 mg | SUBCUTANEOUS | Status: DC
  Administered 2015-12-06 – 2015-12-11 (×6): 30 mg via SUBCUTANEOUS
  Filled 2015-12-06 (×6): qty 0.3

## 2015-12-06 MED ORDER — BUDESONIDE 3 MG PO CPEP
3.0000 mg | ORAL_CAPSULE | Freq: Three times a day (TID) | ORAL | Status: DC
  Administered 2015-12-06 – 2015-12-17 (×32): 3 mg via ORAL
  Filled 2015-12-06 (×39): qty 1

## 2015-12-06 MED ORDER — INSULIN ASPART 100 UNIT/ML ~~LOC~~ SOLN: 6.0000 [IU] | Freq: Three times a day (TID) | SUBCUTANEOUS | Status: DC

## 2015-12-06 MED ORDER — PANTOPRAZOLE SODIUM 40 MG PO TBEC
40.0000 mg | DELAYED_RELEASE_TABLET | Freq: Every day | ORAL | Status: DC
  Administered 2015-12-06 – 2015-12-28 (×23): 40 mg via ORAL
  Filled 2015-12-06 (×26): qty 1

## 2015-12-06 MED ORDER — INSULIN ASPART 100 UNIT/ML ~~LOC~~ SOLN
0.0000 [IU] | Freq: Three times a day (TID) | SUBCUTANEOUS | Status: DC
  Administered 2015-12-07: 3 [IU] via SUBCUTANEOUS
  Administered 2015-12-08 (×2): 4 [IU] via SUBCUTANEOUS
  Administered 2015-12-09: 3 [IU] via SUBCUTANEOUS
  Administered 2015-12-09 – 2015-12-11 (×6): 4 [IU] via SUBCUTANEOUS
  Administered 2015-12-12: 15 [IU] via SUBCUTANEOUS
  Administered 2015-12-12: 11 [IU] via SUBCUTANEOUS
  Administered 2015-12-12: 4 [IU] via SUBCUTANEOUS

## 2015-12-06 MED ORDER — DILTIAZEM HCL ER COATED BEADS 120 MG PO CP24
240.0000 mg | ORAL_CAPSULE | Freq: Every day | ORAL | Status: DC
Start: 2015-12-06 — End: 2015-12-12
  Administered 2015-12-06 – 2015-12-12 (×7): 240 mg via ORAL
  Filled 2015-12-06 (×7): qty 2

## 2015-12-06 MED ORDER — LORATADINE 10 MG PO TABS
10.0000 mg | ORAL_TABLET | Freq: Every day | ORAL | Status: DC
  Administered 2015-12-06 – 2015-12-28 (×23): 10 mg via ORAL
  Filled 2015-12-06 (×25): qty 1

## 2015-12-06 MED ORDER — LIP MEDEX EX OINT
TOPICAL_OINTMENT | CUTANEOUS | Status: AC
  Administered 2015-12-06: 22:00:00
  Filled 2015-12-06: qty 7

## 2015-12-06 MED ORDER — ACYCLOVIR 800 MG PO TABS
800.0000 mg | ORAL_TABLET | Freq: Two times a day (BID) | ORAL | Status: DC
  Administered 2015-12-06 – 2015-12-12 (×12): 800 mg via ORAL
  Filled 2015-12-06 (×3): qty 2
  Filled 2015-12-06 (×2): qty 1
  Filled 2015-12-06 (×8): qty 2

## 2015-12-06 MED ORDER — LABETALOL HCL 200 MG PO TABS
400.0000 mg | ORAL_TABLET | Freq: Two times a day (BID) | ORAL | Status: DC
  Administered 2015-12-06 – 2015-12-12 (×13): 400 mg via ORAL
  Filled 2015-12-06 (×2): qty 4
  Filled 2015-12-06 (×2): qty 2
  Filled 2015-12-06: qty 4
  Filled 2015-12-06: qty 2
  Filled 2015-12-06 (×8): qty 4

## 2015-12-06 MED ORDER — CHOLESTYRAMINE 4 G PO PACK
4.0000 g | PACK | Freq: Two times a day (BID) | ORAL | Status: DC
  Administered 2015-12-06 – 2015-12-27 (×38): 4 g via ORAL
  Filled 2015-12-06 (×46): qty 1

## 2015-12-06 MED ORDER — HYDROCODONE-ACETAMINOPHEN 5-325 MG PO TABS
1.0000 | ORAL_TABLET | ORAL | Status: DC | PRN
  Administered 2015-12-28: 2 via ORAL
  Filled 2015-12-06: qty 2

## 2015-12-06 MED ORDER — LISINOPRIL 10 MG PO TABS: 10.0000 mg | ORAL_TABLET | Freq: Every day | ORAL | Status: DC

## 2015-12-06 MED ORDER — SODIUM BICARBONATE 650 MG PO TABS
650.0000 mg | ORAL_TABLET | Freq: Every day | ORAL | Status: DC
  Administered 2015-12-07: 650 mg via ORAL
  Filled 2015-12-06: qty 1

## 2015-12-06 MED ORDER — ONDANSETRON 8 MG PO TBDP
8.0000 mg | ORAL_TABLET | Freq: Three times a day (TID) | ORAL | Status: DC | PRN
  Administered 2015-12-08 – 2015-12-13 (×7): 8 mg via ORAL
  Filled 2015-12-06: qty 1
  Filled 2015-12-06 (×5): qty 2
  Filled 2015-12-06 (×2): qty 1
  Filled 2015-12-06 (×2): qty 2

## 2015-12-06 MED ORDER — SODIUM CHLORIDE 0.9 % IV SOLN
2.0000 g | Freq: Two times a day (BID) | INTRAVENOUS | Status: DC
  Administered 2015-12-06 – 2015-12-07 (×2): 2 g via INTRAVENOUS
  Filled 2015-12-06 (×3): qty 20

## 2015-12-06 MED ORDER — MORPHINE SULFATE (PF) 2 MG/ML IV SOLN
1.0000 mg | INTRAVENOUS | Status: DC | PRN
  Administered 2015-12-09 – 2015-12-12 (×5): 1 mg via INTRAVENOUS
  Filled 2015-12-06 (×5): qty 1

## 2015-12-06 MED ORDER — FLUCONAZOLE 100 MG PO TABS
100.0000 mg | ORAL_TABLET | Freq: Every day | ORAL | Status: DC
  Administered 2015-12-06 – 2015-12-28 (×23): 100 mg via ORAL
  Filled 2015-12-06 (×24): qty 1

## 2015-12-06 MED ORDER — TACROLIMUS 1 MG PO CAPS
1.5000 mg | ORAL_CAPSULE | Freq: Two times a day (BID) | ORAL | Status: DC
  Administered 2015-12-06 – 2015-12-10 (×8): 1.5 mg via ORAL
  Filled 2015-12-06 (×10): qty 1

## 2015-12-06 MED ORDER — PROCHLORPERAZINE MALEATE 10 MG PO TABS
10.0000 mg | ORAL_TABLET | Freq: Four times a day (QID) | ORAL | Status: DC | PRN
  Administered 2015-12-24: 10 mg via ORAL
  Filled 2015-12-06: qty 1

## 2015-12-06 MED ORDER — SULFAMETHOXAZOLE-TRIMETHOPRIM 800-160 MG PO TABS: 1.0000 | ORAL_TABLET | Freq: Every day | ORAL | Status: DC

## 2015-12-06 NOTE — H&P (Signed)
[place]  [phone]) [phone] Fax:([phone]) [phone]     ID: VALENTINO SAAVEDRA DOB: September 05, 1946  MR#: [id number]  MOQ#:[id number]  Patient Care Team: Marton Redwood, MD as PCP - General (Internal Medicine) PCP: Marton Redwood, MD OTHER MD:  CHIEF COMPLAINT: Shortness of breath, worsening kidney failure, hypocalcemia, hypoglycemia  HISTORY OF PRESENT ILLNESS: From the original intake note: "We have very complete records from Dr. Racheal Patches in [place], and in summary: The patient was initially diagnosed in August 2000, with a white cell count of 23,600, but normal hemoglobin and platelets, and no significant symptomatology. Over the next several years his white cell count drifted up, and he eventually developed some symptoms of night sweats in particular, leading to treatment with fludarabine, Cytoxan and rituxan for five cycles given between December 2006 and May 2007. We have CT scans from June 2006, November 2006 and April 2007, and comparing the November 2006 and April 2007 scans, there was near complete response. He had subsequent therapy in [place] as detailed below, but with decreased response, leading to allogeneic stem-cell transplant at the [facility] 02/24/2012."  He subsequent history is detailed in "assessment" below  INTERVAL HISTORY: Delane return to the clinic today for a scheduled visit. He receives intravenous fluids and intravenous magnesium every 2 weeks. He has multiple chronic complaints, which have been generally stable Today however before the visit his wife [name] called to tell us that he had some difficulty breathing and a bit of slurred speech. He has had more diarrhea recently, not in terms of frequency but in terms of volume. He has lost quite a bit of fluid there. He has had no cough or fevers despite the shortness of breath. He continues to problems with swollen feet. His blood sugars have been running low, even though he has  been using less insulin per her report.  In the office he was found to be short of breath, with no chest pain or pressure, no cough, and no wheezing or rales on exam. Bilateral lower extremity swelling was unchanged from baseline. See med however showed a glucose of 40 and a creatinine of 3.7 (most recent reading 2.3). Calcium was 6.1, with an albumin of 3.6 and magnesium 1.4. The patient was given orange juice and cookies bringing up his glucose acutely to 68. He was admitted for further evaluation and treatment.  REVIEW OF SYSTEMS: Aside from the problems noted above, a review of systems today was stable. Specifically, Harsha denies unusual headaches, visual changes, nausea, vomiting, difficulty swallowing or pain with swallowing. He denies cough phlegm production or pleurisy. He denies palpitations, or chest pain or pressure. He has had no abdominal cramps. Diarrhea has been liquid, not bloody or melanic. He denies any bony pain. There has been no fever, rash, or itching.  PAST MEDICAL HISTORY: Past Medical History  Diagnosis Date  . 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 Diverticular disease   . Hyperlipidemia   . Obesity   . Hypertension   . Hiatal hernia   . CMV (cytomegalovirus) antibody positive     pre-transplant, with seroconversion x2 pst-transplant  . Right bundle branch block     pre-transplant  . CKD (chronic kidney disease) stage 2, GFR 60-89 ml/min   . Pancytopenia ([place])   . Atrial fibrillation ([place])     post-transplant  . Myopathy   . 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 Chronic GVHD complicating bone marrow transplantation ([facility]) 12/05/2012  . Diarrhea in adult patient 12/05/2012    Due to active GVHD  . Rash of face 12/05/2012    Due to GVHD  . Hypomagnesemia 01/26/2013  . Left hip pain 12/01/2013  . Steroid-induced diabetes ([place])     novalog  . Leukemia, chronic  lymphoid ([place])   . CLL (chronic lymphocytic leukemia) ([place]) 12/05/2012    Dx 07/1999; started Rx 12/06  AlloBMT 3/13    PAST SURGICAL HISTORY: Past Surgical History  Procedure Laterality Date  . Tonsillectomy and adenoidectomy    . Bone marrow transplant    . Flexible sigmoidoscopy  11/17/2012    Procedure: FLEXIBLE SIGMOIDOSCOPY;  Surgeon: Jeryl Columbia, MD;  Location: [facility];  Service: Endoscopy;  Laterality: N/A;  Dr Watt Climes states will be admitted to rooom 1339 11/16/12  . Esophagogastroduodenoscopy  11/17/2012    Procedure: ESOPHAGOGASTRODUODENOSCOPY (EGD);  Surgeon: Jeryl Columbia, MD;  Location: [facility];  Service: Endoscopy;  Laterality: N/A;    FAMILY HISTORY Family History  Problem Relation Age of Onset  . Cancer Father   The patient's father died from complications of chronic lymphocytic leukemia at the age of 68. It had been diagnosed seven years before when he was 71. The patient is enrolled in a familial chronic lymphocytic leukemia study out of the [employer]. The patient's mother is 45, alive, unfortunately suffering with dementia, and he has a brother, 64, who is otherwise in fair health.   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 His wife of >40 years, [name], is a homemaker. Their daughter, [name], lives in [place]. She also is a Agricultural engineer. The patient has an 66 year old grandson and an 30-year-old granddaughter, and that is really the main reason he moved to this area. He is a Tourist information centre manager.     ADVANCED DIRECTIVES: His wife [name] is his healthcare part of attorney   HEALTH MAINTENANCE: Social History  Substance Use Topics  . Smoking status: Never Smoker   . Smokeless tobacco: Never Used  . Alcohol Use: No     Colonoscopy:  2013/May got  Psa:  Lipid panel:  Allergies  Allergen Reactions  . Benadryl [Diphenhydramine Hcl]     "Restless leg syndrome"    Current Facility-Administered Medications  Medication Dose Route Frequency Provider Last Rate Last Dose  . acyclovir (ZOVIRAX) tablet 800 mg  800 mg Oral BID Chauncey Cruel, MD      . budesonide (ENTOCORT EC) 24 hr capsule 3 mg  3 mg Oral TID Chauncey Cruel, MD      . calcium gluconate 2 g in sodium chloride 0.9 % 100 mL IVPB  2 g Intravenous q12n4p Chauncey Cruel, MD      . cholestyramine Lucrezia Starch) packet 4 g  4 g Oral BID Chauncey Cruel, MD      . dextrose 5 %-0.45 % sodium chloride infusion   Intravenous Continuous Chauncey Cruel, MD      . diltiazem (CARDIZEM CD) 24 hr capsule 240 mg  240 mg Oral Daily Chauncey Cruel, MD      . enoxaparin (LOVENOX) injection 30 mg  30 mg Subcutaneous Q24H Chauncey Cruel, MD      . fluconazole (DIFLUCAN) tablet 100 mg  100 mg Oral Daily Chauncey Cruel, MD      . furosemide (LASIX) tablet 80 mg  80 mg Oral BID Chauncey Cruel, MD      . HYDROcodone-acetaminophen (NORCO/VICODIN) 5-325 MG per tablet 1-2 tablet  1-2 tablet Oral Q4H PRN Chauncey Cruel, MD      . insulin aspart (novoLOG) injection 0-20 Units  0-20 Units Subcutaneous TID WC Chauncey Cruel, MD      . insulin aspart (novoLOG) injection 6 Units  6 Units Subcutaneous TID WC Chauncey Cruel, MD      . labetalol (NORMODYNE) tablet 400 mg  400 mg Oral BID Chauncey Cruel, MD      . loratadine (CLARITIN) tablet 10 mg  10 mg Oral Daily Chauncey Cruel, MD      . morphine 2 MG/ML injection 1 mg  1 mg Intravenous Q2H PRN Chauncey Cruel, MD      . ondansetron (ZOFRAN-ODT) disintegrating tablet 8 mg  8 mg Oral Q8H PRN Chauncey Cruel, MD      . pantoprazole (PROTONIX) EC tablet 40 mg  40 mg Oral Daily Chauncey Cruel, MD      . predniSONE (DELTASONE) tablet 20 mg  20 mg Oral Q breakfast Chauncey Cruel, MD      . prochlorperazine  (COMPAZINE) tablet 10 mg  10 mg Oral Q6H PRN Chauncey Cruel, MD      . sertraline (ZOLOFT) tablet 50 mg  50 mg Oral q morning - 10a Chauncey Cruel, MD      . sodium bicarbonate tablet 650 mg  650 mg Oral Daily Chauncey Cruel, MD      . tacrolimus (PROGRAF) capsule 1.5 mg  1.5 mg Oral BID Chauncey Cruel, MD       Facility-Administered Medications Ordered in Other Encounters  Medication Dose Route Frequency Provider Last Rate Last Dose  . 0.9 %  sodium chloride infusion   Intravenous Continuous Amy G Berry, PA-C 500 mL/hr at 03/12/13 0900    . sodium chloride 0.9 % injection 10 mL  10 mL Intravenous PRN Chauncey Cruel, MD   10 mL at 08/11/12 1606    OBJECTIVE: Middle-aged white male with labored breathing Filed Vitals:   12/06/15 0145  BP: 149/77  Pulse: 83  Temp: 97.6 F (36.4 C)  Resp: 18     There is no weight on file to calculate BMI.    ECOG FS:2 - Symptomatic, <50% confined to bed  Ocular: Sclerae unicteric, pupils equal, round and reactive to light Ear-nose-throat: Oropharynx clear, dentition in good repair Lymphatic: No cervical or supraclavicular adenopathy Lungs no rales or rhonchi, no wheezes Heart regular rate and rhythm, no murmur appreciated Abd soft, obese, nontender, positive bowel sounds MSK no focal spinal tenderness, chronic bilateral lower extremity edema Neuro: non-focal, well-oriented, appropriate affect   LAB RESULTS:  CMP     Component Value Date/Time   NA 141 12/06/2015 1440   NA 140 12/06/2015 1139   K 4.8 12/06/2015 1440   K 5.0 12/06/2015 1139   CL 118* 12/06/2015 1440   CL 101 06/15/2013 1034   CO2 12* 12/06/2015 1440   CO2 10* 12/06/2015 1139   GLUCOSE 151* 12/06/2015 1440   GLUCOSE 40* 12/06/2015 1139   GLUCOSE 122* 06/15/2013 1034   BUN 82* 12/06/2015 1440   BUN 82.9* 12/06/2015 1139   CREATININE 3.77* 12/06/2015 1440   CREATININE 3.7* 12/06/2015 1139   CALCIUM 5.8*  12/06/2015 1440   CALCIUM 6.1* 12/06/2015 1139   PROT  5.7* 12/06/2015 1440   PROT 6.0* 12/06/2015 1139   ALBUMIN 3.5 12/06/2015 1440   ALBUMIN 3.6 12/06/2015 1139   AST 35 12/06/2015 1440   AST 35* 12/06/2015 1139   ALT 29 12/06/2015 1440   ALT 34 12/06/2015 1139   ALKPHOS 83 12/06/2015 1440   ALKPHOS 92 12/06/2015 1139   BILITOT 0.4 12/06/2015 1440   BILITOT 0.43 12/06/2015 1139   GFRNONAA 15* 12/06/2015 1440   GFRAA 17* 12/06/2015 1440    INo results found for: SPEP, UPEP  Lab Results  Component Value Date   WBC 5.4 12/06/2015   NEUTROABS 4.4 12/06/2015   HGB 12.1* 12/06/2015   HCT 36.5* 12/06/2015   MCV 91.5 12/06/2015   PLT 120* 12/06/2015    _0 @  No results found for: LABCA2  No components found for: [id number]  No results for input(s): INR in the last 168 hours.  Urinalysis    Component Value Date/Time   COLORURINE AMBER* 03/16/2013 1759   APPEARANCEUR CLEAR 03/16/2013 1759   LABSPEC 1.015 11/03/2013 1504   LABSPEC 1.025 03/16/2013 1759   PHURINE 6.0 11/03/2013 1504   PHURINE 5.0 03/16/2013 1759   GLUCOSEU Negative 11/03/2013 1504   GLUCOSEU >1000* 03/16/2013 1759   HGBUR Negative 11/03/2013 1504   HGBUR NEGATIVE 03/16/2013 1759   BILIRUBINUR Negative 11/03/2013 1504   BILIRUBINUR NEGATIVE 03/16/2013 1759   KETONESUR Negative 11/03/2013 1504   KETONESUR NEGATIVE 03/16/2013 1759   PROTEINUR Negative 11/03/2013 1504   PROTEINUR NEGATIVE 03/16/2013 1759   UROBILINOGEN 0.2 11/03/2013 1504   UROBILINOGEN 0.2 03/16/2013 1759   NITRITE Negative 11/03/2013 1504   NITRITE NEGATIVE 03/16/2013 1759   LEUKOCYTESUR Negative 11/03/2013 1504   LEUKOCYTESUR NEGATIVE 03/16/2013 1759    STUDIES: No results found.  ASSESSMENT: 68 y.o. [place] man with a history of well-differentiated lymphocytic lymphoma/ chronic lymphoid leukemia initially diagnosed in 2000, not requiring intervention until 2006; with multiple chromosomal abnormalities. His treatment history is as follows:  (1)  fludarabine/cyclophosphamide/rituximab x5 completed May 2007.   (2) rituximab for 8 doses October 2010, with partial response   (3) Leustatin and ofatumumab weekly x8 July to September 2011 followed by maintenance ofatumumab every 2 months, with initial response but rising counts September 2012   (4) status-post unrelated donor stem-cell transplant 02/24/2012 at [facility]  (a) conditioning regimen consisted of fludarabine + TBI at 200 cGy, followed by rituximab x27;  (b) CMV reactivation x3 (patient CMV positive, donor negative), s/p ganciclovir treatment; 3d reactivation August 2013, s/p gancyclovir, with negative PCR mid-September 2013; last gancyclovir dose 10/06/2012 (c) Chronic GVHD: involving gut and skin, treated with steroids, tacrolimus and MMF. MMF was eventually d/c'd and tacrolimus currently at a dose of 1.18m BID (d) atrial fibrillation: resolved on brief amiodarone regimen (e) steroid-induced myopathy: improving  (f) hypomagnesemia: improved after d/c gancyclovir, needs continuing support (g) hypogammaglobulinemia: requiring IVIG most recently 08/03/2014. (h) history of elevated triglycerides (606 on 07/14/2012)  (i) adrenal insufficiency: on prednisone and budesonide (j) pancytopenia,resolved (k) brief episode of neutropenia (AMelrose300) February 2015, accompanied by diarrhea; resolved   (5) restaging studies February-March 2015 including CT scans, flow cytometry, and bone marrow biopsy, showed no evidence of residual chronic lymphoid leukemia. (a) repeat bone marrow biopsy 02/27/2015 showed no evidence of chronic lymphoid leukemia and also no dyspoiesis. Flow cytometry showed no B cells.  (6) recurrent GVHD (skin rash, mouth changes, severe diarrhea and gastric/duodenal/colonic biopsies 11/17/2012 c/w GVHD  grade 2) : now grade 1 to inactive  (7) malnutrition -- on VITAL supplement in addition to regular diet; on Marinol for anorexia  (8) testosterone  deficiency--on patch   (9) deconditioning: Especially quad weakness; continuing rehabilitation exercises  (10) mild dehydration: encouraged increased po fluids; receives IVF support w magnesium weekly  (11) severe steroid-induced osteoporosis with compression fractures: received pamidronate 12/18/2012. Status post kyphoplasty at L3-4 in June 2014. Also with evidence of rib fractures and insufficiency fractures bilaterally of the sacral alae, noted by CT in March 2015. -- Denosumab started 12/08/2013, given as prolia Q6 months which is what has been approved by his insurance, most recent dose 03/29/2015  (12) chronic back pain and hip pain controlled with OxyContin and hydrocodone/APAP.  (12) nausea: well controlled on current meds  (13) Positive c.diff, 03/08/2013, on Flagyl 500 mg TID x 20 days, then on oral vanco with Questran, showing improvement; positive when repeated April 2014; Negative x 3 since then  (14) persistently increased BUN and potassium: Improved on daily bicarbonate  (15) Hypertension, on labetalol, cardizem, lisinopril, and furosemide; managed by Dr. Brigitte Pulse  (16) steroid induced hyperglycemia/ DM II: managed by Dr Brigitte Pulse   (17) hypogammaglobulinemia-- requiring intermittent supplementation, most recent dose 10/18/2015  (18) squamous cell CA in situ removed from left parietal scalp October 2014   PLAN: I am not sure why Ever is so short of breath today. He also has significant hypocalcemia, was severely hypoglycemic, and his creatinine is rising. I am going to admit him so we can normalize his electrolytes, optimize his fluid balance, correct his hypoglycemia and hypocalcemia, and evaluate the shortness of breath. Specifically I will obtain a CT of the chest without contrast and an echocardiogram.  Where going to need some help from renal with the rising creatinine. He rated had an appointment with them tomorrow but we will go ahead and consult during this  admission.  I asked Samson in case of a terminal event whether he would want resuscitation and he was unequivocal in one thing "everything done".  Chauncey Cruel, MD   12/06/2015 4:35 PM Medical Oncology and Hematology [facility] [address] [place], [place] [address] Tel. [phone]    Fax. [phone]

## 2015-12-06 NOTE — Progress Notes (Signed)
Timothy [name] was admitted today. Please see the admitting H&P

## 2015-12-06 NOTE — Consult Note (Addendum)
[facility] Admit Date: 12/06/2015 12/06/2015 Rexene Agent Requesting Physician:  Magrinat MD  Reason for Consult:  AoCKD, Hypocalcemia, Edema HPI:  27M seen at request of Dr. Jana Hakim for above issues.  I follow for renal care in the clinic.  He has a complicated PMH.  He is diagnosed with CLL in 2000. He eventually required chemotherapy and subsequent allogenic stem cell transplant and [place] in March 2013. 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 He also takes daily TMP/SMX. He has had progressive decline in kidney function over the past several months to serum creatinine typically ranging in the mid 2's. He saw Dr. Jana Hakim today where he had progressive dyspnea, and was identified to have hypoglycemia, hypocalcemia, worsened renal function. He has a chronic metabolic acidosis (CKD+diarrhea?) and has been poorly tolerant of sodium bicarbonate therapy because of edema. He also has had chronic hyperkalemia and recently his ACE inhibitor was stopped.  HE states his dyspnea has developed over past several weeks, gradually, with progressive loss of endurance.  His edema worsened as well, more recently with mild improvement.  No F/C, productive cough.  He takes TMP/SMX daily for PCP prevention.  Has hx/o mildly assymetric edema of legs, R>L, similar now.  NO cords or pain with edema.  No recent prolonged travel.  No problems with urinary function.    [place] 9518-84166.0- 1.1 20151.1-1.5  Jan - Jun 20161.3- 1.6 July1.5 Aug1.6- 1.9 Sept1.7- 2.4 Oct2.0- 2.6 Nov2.3- 2.9 Dec 72.3 Dec  630.1601 Dec 163.65 Dec 173.2216  CREATININE (mg/dL)  Date Value  12/06/2015 3.7*  11/28/2015 2.3*  11/13/2015 2.9*  11/07/2015 2.6*  10/31/2015 2.3*  10/24/2015 2.3*  10/17/2015 2.3*  10/10/2015 2.0*  10/03/2015 2.6*  09/26/2015 2.4*   CREATININE, SER (mg/dL)  Date Value  12/06/2015 3.77*  04/05/2015 1.23  02/27/2015 1.44*  09/07/2014 1.20  06/14/2013 0.97  04/18/2013 0.95  03/18/2013 0.91  03/16/2013 1.00  12/27/2012 0.63  12/20/2012 0.90  ] I/Os:  ROS NSAIDS: denies IV Contrast no exposure TMP/SMX takes daily Hypotension none Balance of 12 systems is negative w/ exceptions as above  PMH  Past Medical History  Diagnosis Date  . 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 Chronic graft-versus-host disease ([place])   . Diverticular disease   . Hyperlipidemia   . Obesity   . Hypertension   . Hiatal hernia   . CMV (cytomegalovirus) antibody positive     pre-transplant, with seroconversion x2 pst-transplant  . Right bundle branch block     pre-transplant  . CKD (chronic kidney disease) stage 2, GFR 60-89 ml/min   . Pancytopenia ([place])   . Atrial fibrillation ([place])     post-transplant  . Myopathy   . 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 Chronic graft-versus-host disease ([place])   . Chronic GVHD complicating bone marrow transplantation ([place]) 12/05/2012  . Diarrhea in adult patient 12/05/2012    Due to active GVHD  . Rash of face 12/05/2012    Due to GVHD  . Hypomagnesemia 01/26/2013  . Left hip pain 12/01/2013  . Steroid-induced diabetes ([place])     novalog  . Leukemia, chronic lymphoid ([place])   . CLL (chronic lymphocytic leukemia) ([place]) 12/05/2012    Dx 07/1999; started Rx 12/06  AlloBMT 3/13   PSH  Past Surgical History  Procedure Laterality Date  . Tonsillectomy and adenoidectomy    . Bone marrow transplant    .  Flexible sigmoidoscopy  11/17/2012    Procedure: FLEXIBLE SIGMOIDOSCOPY;  Surgeon: Petra Kuba, MD;  Location: [facility];  Service: Endoscopy;  Laterality: N/A;  Dr Ewing Schlein states will be admitted to rooom 1339 11/16/12  . Esophagogastroduodenoscopy  11/17/2012    Procedure: ESOPHAGOGASTRODUODENOSCOPY (EGD);  Surgeon: Petra Kuba, MD;  Location: [facility];  Service: Endoscopy;  Laterality: N/A;   FH  Family History  Problem Relation Age of Onset  . Cancer Father    SH  reports that he has never smoked. He has never used smokeless tobacco. He reports that he does not drink alcohol or use illicit drugs. Allergies  Allergies  Allergen Reactions  . Benadryl [Diphenhydramine Hcl]     "Restless leg syndrome"   Home medications Prior to Admission medications   Medication Sig Start Date End Date Taking? Authorizing Provider  acyclovir (ZOVIRAX) 400 MG tablet TAKE 2 TABLETS BY MOUTH TWICE DAILY Patient taking differently: TAKE 800 MG BY MOUTH TWICE DAILY 11/12/15  Yes Heather F Boelter, NP  budesonide (ENTOCORT EC) 3 MG 24 hr capsule TAKE 1 CAPSULE BY MOUTH 3 TIMES DAILY Patient taking differently: TAKE 3 MG BY MOUTH 3 TIMES DAILY 06/15/15  Yes Lowella Dell, MD  cholestyramine [place] Inch) 4 G packet Take 1 packet (4 g total) by mouth 2 (two) times daily. Patient taking differently: Take 4 g by mouth daily as needed (constipation).  01/11/15  Yes Sheffield Slider, NP  diclofenac sodium (VOLTAREN) 1 % GEL Apply 2 g topically 2 (two) times daily. Patient taking differently: Apply 2 g topically daily.  10/31/15  Yes Lowella Dell, MD  diltiazem (CARDIZEM CD) 240 MG 24 hr capsule TAKE 1 CAPSULE BY MOUTH ONCE DAILY Patient taking differently: TAKE 240 MG BY MOUTH DAILY 08/22/15  Yes Lowella Dell, MD  fluconazole (DIFLUCAN) 100 MG tablet TAKE 1 TABLET BY MOUTH ONCE DAILY Patient taking differently: TAKE 100 MG BY MOUTH ONCE DAILY 10/11/15  Yes Lowella Dell, MD  furosemide (LASIX)  40 MG tablet Take 2 tablets (80 mg total) by mouth 2 (two) times daily. 11/12/15  Yes Lowella Dell, MD  insulin aspart (NOVOLOG) 100 unit/mL injection Inject 10-20 Units into the skin 3 (three) times daily before meals. Sliding scale   Yes Historical Provider, MD  Insulin Pen Needle (B-D UF III MINI PEN NEEDLES) 31G X 5 MM MISC Use five daily with insulin as directed. 04/12/15  Yes Lowella Dell, MD  labetalol (NORMODYNE) 200 MG tablet TAKE 2 TABLETS BY MOUTH TWICE A DAY Patient taking differently: TAKE 400 MG BY MOUTH TWICE DAILY 11/12/15  Yes Sheffield Slider, NP  loratadine (CLARITIN) 10 MG tablet Take 10 mg by mouth daily as needed for allergies.    Yes Historical Provider, MD  omeprazole (PRILOSEC) 20 MG capsule Take 1 capsule (20 mg total) by mouth daily. 11/28/15  Yes Sheffield Slider, NP  ondansetron (ZOFRAN-ODT) 8 MG disintegrating tablet Take 1 tablet (8 mg total) by mouth every 8 (eight) hours as needed for nausea or vomiting. 11/28/15  Yes Sheffield Slider, NP  ONE TOUCH ULTRA TEST test strip TEST BEFORE MEALS AND AT BEDTIME. 06/28/15  Yes Lowella Dell, MD  [facility] DELICA LANCETS 33G MISC USE TO TEST BEFORE MEALS AND AT BEDTIME 03/29/15  Yes Lowella Dell, MD  predniSONE (DELTASONE) 5 MG tablet Take 1 tablet (5 mg total) by mouth daily with breakfast. Pt takes 1 1/2 tablets daily totaling 7.5 mg Patient  taking differently: Take 7.5 mg by mouth daily with breakfast.  11/28/15  Yes Sheffield Slider, NP  pseudoephedrine (SUDAFED) 60 MG tablet Take 1 tablet (60 mg total) by mouth every 4 (four) hours as needed for congestion. 09/12/15  Yes Lowella Dell, MD  sertraline (ZOLOFT) 50 MG tablet Take 1 tablet (50 mg total) by mouth every morning. 03/29/15  Yes Lowella Dell, MD  sodium bicarbonate 650 MG tablet Take 1,300 mg by mouth daily.    Yes Historical Provider, MD  sulfamethoxazole-trimethoprim (BACTRIM DS,SEPTRA DS) 800-160 MG tablet TAKE 1 TABLET BY MOUTH ONCE DAILY  10/23/15  Yes Lowella Dell, MD  tacrolimus (PROGRAF) 0.5 MG capsule TAKE 3 CAPSULES BY MOUTH TWICE DAILY Patient taking differently: TAKE 1.5 MG BY MOUTH TWICE DAILY 07/19/15  Yes Lowella Dell, MD  TRESIBA FLEXTOUCH 200 UNIT/ML SOPN Inject 50 Units as directed daily.  03/26/15  Yes Historical Provider, MD  prochlorperazine (COMPAZINE) 10 MG tablet Take 1 tablet (10 mg total) by mouth every 6 (six) hours as needed for nausea or vomiting. Patient not taking: Reported on 11/28/2015 11/05/15   Lowella Dell, MD    Current Medications Scheduled Meds: . acyclovir  800 mg Oral BID  . budesonide  3 mg Oral TID  . calcium gluconate  2 g Intravenous q12n4p  . cholestyramine  4 g Oral BID  . diltiazem  240 mg Oral Daily  . enoxaparin (LOVENOX) injection  30 mg Subcutaneous Q24H  . fluconazole  100 mg Oral Daily  . furosemide  80 mg Oral BID  . insulin aspart  0-20 Units Subcutaneous TID WC  . insulin aspart  6 Units Subcutaneous TID WC  . labetalol  400 mg Oral BID  . loratadine  10 mg Oral Daily  . pantoprazole  40 mg Oral Daily  . predniSONE  20 mg Oral Q breakfast  . sertraline  50 mg Oral q morning - 10a  . sodium bicarbonate  650 mg Oral Daily  . tacrolimus  1.5 mg Oral BID   Continuous Infusions: . dextrose 5 % and 0.45% NaCl     PRN Meds:.HYDROcodone-acetaminophen, morphine injection, ondansetron, prochlorperazine  CBC  Recent Labs Lab 12/06/15 1138 12/06/15 1440  WBC 6.7 5.4  NEUTROABS 5.7 4.4  HGB 13.1 12.1*  HCT 39.4 36.5*  MCV 90.6 91.5  PLT 107* 120*   Basic Metabolic Panel  Recent Labs Lab 12/06/15 1139 12/06/15 1440  NA 140 141  K 5.0 4.8  CL  --  118*  CO2 10* 12*  GLUCOSE 40* 151*  BUN 82.9* 82*  CREATININE 3.7* 3.77*  CALCIUM 6.1* 5.8*  PHOS  --  5.7*    Physical Exam  Blood pressure 149/77, pulse 83, temperature 97.6 F (36.4 C), temperature source Oral, resp. rate 18, SpO2 100 %. GEN: NAD, Inc RR, mild inc WOB, unable to speak in full  sentences ENT: NCAT EYES: EOMI CV: regular, nl s1s2 PULM: bibasilar crackles, no wheezing, as above ABD: s/nd, protuberant SKIN: no new rashes/edema EXT:3-4+ pitting edema to knees   Assessment  1. AoCKD4, ? Related to #2, #10 (new insterstial disease, impaired creatinine secretion?) 2. Progressive SOB, ? Acute CHF, infection, primary pulm issue, favor CHF 3. Hypocalcemia, chronic, worsened -- ? 2/2 hypomagnesemia+CKD, r/o low PTH or 25-OH VIt D 4. Chronic NAG Metabolic Acidosis 5. Edema 6. S/p autogeneic SCT for CLL 7. Chronic GVHD, skin and GI 8. Chronic Diarrhea 9. Chronic Immunosupression with Tac + Pred 10.  Chronic TMP/SMX use 11. HTN 12. Hypomagnesemia  Plan 1. Agree with Non-contrasted CT chest and TTE 2. Renal US to eval for obstruction 3. UA and UP/C to eval for heavy proteinuria, hematuria 4. Cont diuretics currently 5. Hold TMP/SMX 6. Cont Ca repletion 7. Check PTH and 25-OH Vit D   Pearson Grippe MD [phone] pgr 12/06/2015, 5:03 PM

## 2015-12-06 NOTE — Progress Notes (Signed)
Post call of panic glucose of 40- and pt given 12 oz of orange juice and 1/2 of a chocolate chip cookie blood sugar rechecked with reading of 68.  Pt transferred to admitting via wheelchair accompanied by wife and nurse tech.

## 2015-12-06 NOTE — Progress Notes (Signed)
Echocardiogram 2D Echocardiogram has been performed.  Timothy [name] 12/06/2015, 4:10 PM

## 2015-12-06 NOTE — Progress Notes (Signed)
CRITICAL VALUE ALERT  Critical value received: calcium 5.8  Date of notification:  12/06/15  Time of notification:  [id number]  Critical value read back:yes  Nurse who received alert:  Lottie Dawson  MD notified (1st page): Dr Jana Hakim  Time of first page:  63  MD notified (2nd page):  Time of second page:  Responding MD:  Dr Jana Hakim  Time MD responded: [phone]

## 2015-12-07 ENCOUNTER — Encounter (HOSPITAL_COMMUNITY): Payer: Self-pay | Admitting: Pulmonary Disease

## 2015-12-07 ENCOUNTER — Inpatient Hospital Stay (HOSPITAL_COMMUNITY): Payer: BC Managed Care – PPO

## 2015-12-07 DIAGNOSIS — N181 Chronic kidney disease, stage 1: Secondary | ICD-10-CM

## 2015-12-07 DIAGNOSIS — I454 Nonspecific intraventricular block: Secondary | ICD-10-CM

## 2015-12-07 DIAGNOSIS — D801 Nonfamilial hypogammaglobulinemia: Secondary | ICD-10-CM

## 2015-12-07 DIAGNOSIS — T380X5D Adverse effect of glucocorticoids and synthetic analogues, subsequent encounter: Secondary | ICD-10-CM

## 2015-12-07 LAB — GLUCOSE, CAPILLARY
GLUCOSE-CAPILLARY: 93 mg/dL (ref 65–99)
GLUCOSE-CAPILLARY: 94 mg/dL (ref 65–99)
GLUCOSE-CAPILLARY: 143 mg/dL — AB (ref 65–99)
Glucose-Capillary: 99 mg/dL (ref 65–99)
Glucose-Capillary: 128 mg/dL — ABNORMAL HIGH (ref 65–99)

## 2015-12-07 LAB — COMPREHENSIVE METABOLIC PANEL
ALBUMIN: 3.3 g/dL — AB (ref 3.5–5.0)
ALK PHOS: 76 U/L (ref 38–126)
ALT: 27 U/L (ref 17–63)
AST: 32 U/L (ref 15–41)
Anion gap: 12 (ref 5–15)
BILIRUBIN TOTAL: 0.6 mg/dL (ref 0.3–1.2)
BUN: 78 mg/dL — AB (ref 6–20)
CO2: 13 mmol/L — ABNORMAL LOW (ref 22–32)
Calcium: 6.2 mg/dL — CL (ref 8.9–10.3)
Chloride: 117 mmol/L — ABNORMAL HIGH (ref 101–111)
Creatinine, Ser: 3.65 mg/dL — ABNORMAL HIGH (ref 0.61–1.24)
GFR calc Af Amer: 18 mL/min — ABNORMAL LOW (ref >60)
GFR calc non Af Amer: 16 mL/min — ABNORMAL LOW (ref >60)
GLUCOSE: 99 mg/dL (ref 65–99)
POTASSIUM: 4.4 mmol/L (ref 3.5–5.1)
Sodium: 142 mmol/L (ref 135–145)
TOTAL PROTEIN: 5.6 g/dL — AB (ref 6.5–8.1)

## 2015-12-07 LAB — INFLUENZA PANEL BY PCR (TYPE A & B)
H1N1FLUPCR: NOT DETECTED
INFLAPCR: NEGATIVE
INFLBPCR: NEGATIVE

## 2015-12-07 LAB — CK TOTAL AND CKMB (NOT AT ARMC)
CK, MB: 10.5 ng/mL — ABNORMAL HIGH (ref 0.5–5.0)
Relative Index: 10.3 — ABNORMAL HIGH (ref 0.0–2.5)
Total CK: 102 U/L (ref 49–397)

## 2015-12-07 LAB — URINALYSIS, ROUTINE W REFLEX MICROSCOPIC
BILIRUBIN URINE: NEGATIVE
GLUCOSE, UA: NEGATIVE mg/dL
HGB URINE DIPSTICK: NEGATIVE
Ketones, ur: NEGATIVE mg/dL
Leukocytes, UA: NEGATIVE
Nitrite: NEGATIVE
Protein, ur: NEGATIVE mg/dL
SPECIFIC GRAVITY, URINE: 1.017 (ref 1.005–1.030)
pH: 5 (ref 5.0–8.0)

## 2015-12-07 LAB — TROPONIN I: TROPONIN I: 0.05 ng/mL — AB (ref <0.031)

## 2015-12-07 LAB — PROCALCITONIN: Procalcitonin: 0.37 ng/mL

## 2015-12-07 MED ORDER — PREDNISONE 5 MG PO TABS
10.0000 mg | ORAL_TABLET | Freq: Every day | ORAL | Status: DC
  Administered 2015-12-07 – 2015-12-10 (×4): 10 mg via ORAL
  Filled 2015-12-07 (×4): qty 2

## 2015-12-07 MED ORDER — MAGNESIUM SULFATE 2 GM/50ML IV SOLN
2.0000 g | Freq: Once | INTRAVENOUS | Status: AC
  Administered 2015-12-07: 2 g via INTRAVENOUS
  Filled 2015-12-07: qty 50

## 2015-12-07 MED ORDER — TECHNETIUM TC 99M DIETHYLENETRIAME-PENTAACETIC ACID: 31.0000 | Freq: Once | INTRAVENOUS | Status: DC | PRN

## 2015-12-07 MED ORDER — TECHNETIUM TO 99M ALBUMIN AGGREGATED
4.0700 | Freq: Once | INTRAVENOUS | Status: AC | PRN
  Administered 2015-12-07: 4.1 via INTRAVENOUS

## 2015-12-07 MED ORDER — SODIUM CHLORIDE 0.9 % IV SOLN
2.0000 g | Freq: Three times a day (TID) | INTRAVENOUS | Status: DC
  Administered 2015-12-07 – 2015-12-08 (×2): 2 g via INTRAVENOUS
  Filled 2015-12-07 (×3): qty 20

## 2015-12-07 NOTE — Progress Notes (Addendum)
Inpatient Diabetes Program Recommendations  AACE/ADA: New Consensus Statement on Inpatient Glycemic Control (2015)  Target Ranges:  Prepandial:   less than 140 mg/dL      Peak postprandial:   less than 180 mg/dL (1-2 hours)      Critically ill patients:  140 - 180 mg/dL   Review of Glycemic Control  Diabetes history: DM 2 Outpatient Diabetes medications: Tresiba(basal insulin) 50 units Daily, Novolog 10-20 units TID with meals Current orders for Inpatient glycemic control: Novolog Resistant AC + Novolog 6 units meal coverage TID  Inpatient Diabetes Program Recommendations: Correction (SSI): Due to worsening renal failure, please consider decreasing Novolog correction to Sensitive scale TID. Patient will hold onto insulin longer. If renal function improves, maybe able to increase correction and add basal insulin.  Thanks,  Tama Headings RN, MSN, [facility] Inpatient Diabetes Coordinator Team Pager [phone] (8a-5p)

## 2015-12-07 NOTE — Consult Note (Signed)
Name: Timothy [name] MRN: [id number] DOB: Sep 06, 1946    ADMISSION DATE:  12/06/2015 CONSULTATION DATE:  12/07/15  REFERRING MD :  Dr. Vinie Sill   CHIEF COMPLAINT:  Basilar Infiltrates, Hypoxemic Respiratory Failure   BRIEF PATIENT DESCRIPTION: 69 y/o M with PMH of   SIGNIFICANT EVENTS  12/15  Admit from [facility] with multiple c/o's - more diarrhea, SOB, slurred speech, low blood sugars  STUDIES:  12/15  CT Chest >> bilateral airspace & ground glass opacities, atherosclerosis in coronary arteries, cardiomegaly, small R pleural effusion, non-specific gallbladder distention, trace perihepatic ascites  12/15  ECHO >> LV with mod to severe LVH, mild reduction of systolic fxn, EF [id number], No RWMA, grade 1 diastolic dysfunction, mild mitral regurg, mild aortic regurg 12/16  VQ >>    HISTORY OF PRESENT ILLNESS:  69 y/o M with PMH of CKD II, obesity, HTN, hiatal hernia, RBB, AF, CLL s/p allogenic stem-cell transplant, 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    In office the patient & wife reported he had noted an increase in chronic diarrhea (volume), shortness of breath but no cough/sputum production, lower extremity swelling, low blood sugars and slurred speech.  Office notes reflect no change in lower extremity swelling, glucose assessed and 40.  Serum creatinine increased to 3.7 from 2.3, calcium 6.1, albumin 3.6 and magnesium 1.4 (in office).  The patient was treated with orange juice and cookies with improvement in glucose.  He was admitted by Dr. Jana Hakim for further evaluation.    CT evaluation of chest demonstrated bilateral airspace & ground glass opacities, atherosclerosis in coronary arteries, cardiomegaly, small R pleural effusion, non-specific gallbladder distention, trace perihepatic ascites.  The patient was placed on 3L O2  (unfortunately, I can not find a documented saturation from the cancer center).  Initial O2 saturations in hospital were documented on oxygen.  Given basilar infiltrates and immunocompromised status, PCCM consulted for evaluation.  Currently, he denies SOB, has not experienced cough, sputum production or hemoptysis.  He denies chest pain, pain with inspiration.  He reports approx 5 lb weight loss in the last week due to poor appetite.  Reports chronic diarrhea volume increased.        PAST MEDICAL HISTORY :   has a past medical history of Transplant recipient (07/12/2012); Chronic graft-versus-host disease ([place]); Diverticular disease; Hyperlipidemia; Obesity; Hypertension; Hiatal hernia; CMV (cytomegalovirus) antibody positive; Right bundle branch block; CKD (chronic kidney disease) stage 2, GFR 60-89 ml/min; Pancytopenia ([place]); Atrial fibrillation ([place]); Myopathy; Fine tremor; Chronic graft-versus-host disease ([place]); Chronic GVHD complicating bone marrow transplantation ([place]) (12/05/2012); Diarrhea in adult patient (12/05/2012); Rash of face (12/05/2012); Hypomagnesemia (01/26/2013); Left hip pain (12/01/2013); Steroid-induced diabetes ([place]); Leukemia, chronic lymphoid ([place]); and CLL (chronic lymphocytic leukemia) ([place]) (12/05/2012).  has past surgical history that includes Tonsillectomy and adenoidectomy; Bone marrow transplant; Flexible sigmoidoscopy (11/17/2012); and Esophagogastroduodenoscopy (11/17/2012).    Prior to Admission medications   Medication Sig Start Date End Date Taking? Authorizing Provider  acyclovir (ZOVIRAX) 400 MG tablet TAKE 2 TABLETS BY MOUTH TWICE DAILY Patient taking differently: TAKE 800 MG BY MOUTH TWICE DAILY 11/12/15  Yes Heather F Boelter, NP  budesonide (ENTOCORT EC) 3 MG 24 hr capsule TAKE 1 CAPSULE BY MOUTH 3 TIMES DAILY Patient taking differently: TAKE 3 MG BY MOUTH 3 TIMES DAILY 06/15/15  Yes Chauncey Cruel, MD  cholestyramine Lucrezia Starch) 4 G packet Take  1 packet  (4 g total) by mouth 2 (two) times daily. Patient taking differently: Take 4 g by mouth daily as needed (constipation).  01/11/15  Yes Laurie Panda, NP  diclofenac sodium (VOLTAREN) 1 % GEL Apply 2 g topically 2 (two) times daily. Patient taking differently: Apply 2 g topically daily.  10/31/15  Yes Chauncey Cruel, MD  diltiazem (CARDIZEM CD) 240 MG 24 hr capsule TAKE 1 CAPSULE BY MOUTH ONCE DAILY Patient taking differently: TAKE 240 MG BY MOUTH DAILY 08/22/15  Yes Chauncey Cruel, MD  fluconazole (DIFLUCAN) 100 MG tablet TAKE 1 TABLET BY MOUTH ONCE DAILY Patient taking differently: TAKE 100 MG BY MOUTH ONCE DAILY 10/11/15  Yes Chauncey Cruel, MD  furosemide (LASIX) 40 MG tablet Take 2 tablets (80 mg total) by mouth 2 (two) times daily. 11/12/15  Yes Chauncey Cruel, MD  insulin aspart (NOVOLOG) 100 unit/mL injection Inject 10-20 Units into the skin 3 (three) times daily before meals. Sliding scale   Yes Historical Provider, MD  Insulin Pen Needle (B-D UF III MINI PEN NEEDLES) 31G X 5 MM MISC Use five daily with insulin as directed. 04/12/15  Yes Chauncey Cruel, MD  labetalol (NORMODYNE) 200 MG tablet TAKE 2 TABLETS BY MOUTH TWICE A DAY Patient taking differently: TAKE 400 MG BY MOUTH TWICE DAILY 11/12/15  Yes Laurie Panda, NP  loratadine (CLARITIN) 10 MG tablet Take 10 mg by mouth daily as needed for allergies.    Yes Historical Provider, MD  omeprazole (PRILOSEC) 20 MG capsule Take 1 capsule (20 mg total) by mouth daily. 11/28/15  Yes Laurie Panda, NP  ondansetron (ZOFRAN-ODT) 8 MG disintegrating tablet Take 1 tablet (8 mg total) by mouth every 8 (eight) hours as needed for nausea or vomiting. 11/28/15  Yes Laurie Panda, NP  ONE TOUCH ULTRA TEST test strip TEST BEFORE MEALS AND AT BEDTIME. 06/28/15  Yes Chauncey Cruel, MD  [facility] DELICA LANCETS [id number] MISC USE TO TEST BEFORE MEALS AND AT BEDTIME 03/29/15  Yes Chauncey Cruel, MD  predniSONE (DELTASONE) 5 MG tablet Take  1 tablet (5 mg total) by mouth daily with breakfast. Pt takes 1 1/2 tablets daily totaling 7.5 mg Patient taking differently: Take 7.5 mg by mouth daily with breakfast.  11/28/15  Yes Laurie Panda, NP  pseudoephedrine (SUDAFED) 60 MG tablet Take 1 tablet (60 mg total) by mouth every 4 (four) hours as needed for congestion. 09/12/15  Yes Chauncey Cruel, MD  sertraline (ZOLOFT) 50 MG tablet Take 1 tablet (50 mg total) by mouth every morning. 03/29/15  Yes Chauncey Cruel, MD  sodium bicarbonate 650 MG tablet Take 1,300 mg by mouth daily.    Yes Historical Provider, MD  sulfamethoxazole-trimethoprim (BACTRIM DS,SEPTRA DS) 800-160 MG tablet TAKE 1 TABLET BY MOUTH ONCE DAILY 10/23/15  Yes Chauncey Cruel, MD  tacrolimus (PROGRAF) 0.5 MG capsule TAKE 3 CAPSULES BY MOUTH TWICE DAILY Patient taking differently: TAKE 1.5 MG BY MOUTH TWICE DAILY 07/19/15  Yes Chauncey Cruel, MD  TRESIBA FLEXTOUCH 200 UNIT/ML SOPN Inject 50 Units as directed daily.  03/26/15  Yes Historical Provider, MD  prochlorperazine (COMPAZINE) 10 MG tablet Take 1 tablet (10 mg total) by mouth every 6 (six) hours as needed for nausea or vomiting. Patient not taking: Reported on 11/28/2015 11/05/15   Chauncey Cruel, MD   Allergies  Allergen Reactions  . Benadryl [Diphenhydramine Hcl]     "Restless leg syndrome"  FAMILY HISTORY:  family history includes Cancer in his father.  SOCIAL HISTORY:  reports that he has never smoked. He has never used smokeless tobacco. He reports that he does not drink alcohol or use illicit drugs.  REVIEW OF SYSTEMS:   Constitutional: Negative for fever, chills, weight loss, malaise/fatigue and diaphoresis.  HENT: Negative for hearing loss, ear pain, nosebleeds, congestion, sore throat, neck pain, tinnitus and ear discharge.   Eyes: Negative for blurred vision, double vision, photophobia, pain, discharge and redness.  Respiratory: Negative for cough, hemoptysis, sputum production, wheezing  and stridor.  Reports shortness of breath Cardiovascular: Negative for chest pain, palpitations, orthopnea, claudication, leg swelling and PND.  Gastrointestinal: Negative for heartburn, nausea, vomiting, abdominal pain, constipation, blood in stool and melena. Reports chronic diarrhea with increase in volume Genitourinary: Negative for dysuria, urgency, frequency, hematuria and flank pain.  Musculoskeletal: Negative for myalgias, back pain, joint pain and falls.  Skin: Negative for itching and rash.  Neurological: Negative for dizziness, tingling, tremors, sensory change, speech change, focal weakness, seizures, loss of consciousness, weakness and headaches.  Endo/Heme/Allergies: Negative for environmental allergies and polydipsia. Does not bruise/bleed easily.  SUBJECTIVE:   VITAL SIGNS: Temp:  [97.6 F (36.4 C)-97.8 F (36.6 C)] 97.6 F (36.4 C) (12/16 0458) Pulse Rate:  [78-83] 78 (12/16 0458) Resp:  [18-20] 20 (12/16 0458) BP: (121-125)/(63-77) 121/63 mmHg (12/16 0458) SpO2:  [97 %-100 %] 97 % (12/16 0458) Weight:  [216 lb (97.977 kg)] 216 lb (97.977 kg) (12/16 1000)  PHYSICAL EXAMINATION: General:  Chronically ill appearing male in NAD Neuro:  AAOx4, speech clear, MAE HEENT:  MM pink/moist, no jvd  Cardiovascular:  s1s2 regular, distant tones, palpable peripheral pulses  Lungs:  Even/non-labored, basilar crackles  Abdomen:  Round/soft, bsx4 active, non-tender  Musculoskeletal:  No acute deformities  Skin:  Warm/dry, thin, scattered bruises    Recent Labs Lab 12/06/15 1139 12/06/15 1440 12/07/15 1010  NA 140 141 142  K 5.0 4.8 4.4  CL  --  118* 117*  CO2 10* 12* 13*  BUN 82.9* 82* 78*  CREATININE 3.7* 3.77* 3.65*  GLUCOSE 40* 151* 99    Recent Labs Lab 12/06/15 1138 12/06/15 1440  HGB 13.1 12.1*  HCT 39.4 36.5*  WBC 6.7 5.4  PLT 107* 120*   Ct Chest Wo Contrast  12/06/2015  CLINICAL DATA:  Shortness of breath. Kidney failure. Hypocalcemia. Hypoglycemia.  History of leukemia, status post bone marrow transplant in 2013. EXAM: CT CHEST WITHOUT CONTRAST TECHNIQUE: Multidetector CT imaging of the chest was performed following the standard protocol without IV contrast. COMPARISON:  02/19/2015 FINDINGS: Mediastinum/Nodes: The right-sided Port-A-Cath which terminates at the superior caval/ atrial junction. Tortuous descending thoracic aorta. Mild cardiomegaly. Multivessel coronary artery atherosclerosis. No mediastinal or definite hilar adenopathy, given limitations of unenhanced CT. Lungs/Pleura: Tiny right-sided pleural effusion. Right worse than left bibasilar and dependent upper lobe airspace and ground-glass opacity with mild septal thickening. Upper abdomen: Trace perihepatic ascites. Normal imaged portions of the spleen, stomach, adrenal glands, kidneys. The gallbladder is incompletely imaged but may be distended. This is similar to on the prior exam. No calcified stone. Subtle edema in the porta hepatis, including on image 56/series 2. Musculoskeletal: Remote left-sided rib fractures. Osteopenia. Advanced thoracolumbar spondylosis. Mild superior endplate compression deformity at T4 is chronic. Interval healing of previously described T10 compression deformity. A moderate T12 compression deformity is not significantly changed. IMPRESSION: 1. Bilateral airspace and ground-glass opacities, favor infection. Given dependent distribution, aspiration could look similar. 2.  Atherosclerosis, including within the coronary arteries. 3. Cardiomegaly with small right pleural effusion. 4. Nonspecific gallbladder distension. Suggestion of edema in the porta hepatis. Correlate with upper abdominal symptoms. Consider correlation with pancreatic enzyme levels to exclude pancreatitis. Depending on symptomatology, abdominal ultrasound or CT may be informative. 5. Trace perihepatic ascites. Electronically Signed   By: Abigail Miyamoto M.D.   On: 12/06/2015 18:46   US Renal  Port  12/06/2015  CLINICAL DATA:  Acute renal failure EXAM: RENAL / URINARY TRACT ULTRASOUND COMPLETE COMPARISON:  02/19/2015 FINDINGS: Right Kidney: Length: 11.6 cm. Echogenicity within normal limits. No mass or hydronephrosis visualized. Left Kidney: Length: 10.9 cm. Echogenicity within normal limits. No mass or hydronephrosis visualized. Bladder: Limit assessment.  Under distended.  Grossly unremarkable. IMPRESSION: No acute finding by renal ultrasound. Electronically Signed   By: Jerilynn Mages.  Shick M.D.   On: 12/06/2015 21:13    ASSESSMENT / PLAN:   Bilateral Infiltrates / Ground Glass Opacities - differential includes bacterial / viral illness, high risk for opportunistic infection Small Right Pleural Effusion  Acute Hypoxic Respiratory Failure   Plan: Will arrange for FOB, planned for 12/19 @ 0800   NPO after MN on 12/19 Oxygen as needed to support saturations > 90% Will need ambulatory assessment prior to discharge  Intermittent CXR Await V/Q results  Pulmonary hygiene:  IS, mobilize Lasix 80 mg BID  Assess influenza panel  Hold abx for now as pt is afebrile and no increase in WBC Assess PCT  CLL  Chronic Graft vs Host Disease   Plan: Continue baseline prednisone 10mg  QD, acyclovir, diflucan,  Plan per Dr. Marney Setting, NP-C [place] Pgr: [phone] or if no answer [phone] 12/07/2015, 12:21 PM

## 2015-12-07 NOTE — Progress Notes (Signed)
Greatly appreciate the help from nephrology and pulmonary. So far we have an EKG w a new left fascicular block (RBBB is old) and no ST changes. Serial troponins are minimally elevated and completely stable; CK:MB was 102/10, and V/Q is low probability-- in the absence of any anginal symptoms and with significant CKD I think all this is secondary cardiac strain. Will repeat one more troponin in AM to confirm  I am reluctant to start antibiotics given the absence of a fever-- he is on low dose prednisone which should not completely mask a temperature.   The answer I think will be in the BAL which is scheduled for 12/19.  Will continue to replete Ca++ and Mg++, and to follow creat (slight improvement to 3.65). Will also check IGG level and replace as needed

## 2015-12-07 NOTE — Progress Notes (Signed)
Echo shows EF 45%, no hypokinesia. Troponins mildly persistenyl elevated--nonspecific. GCL improved.  Labs this AM pending.  Will check V/Q. Will ask pulmunology to evaluate, consider BAL for diagnosis of infiltrates (afebrile) if V/Q negative

## 2015-12-07 NOTE — Progress Notes (Signed)
Initial Nutrition Assessment  DOCUMENTATION CODES:   Not applicable  INTERVENTION:  -Sugar free ice cream   NUTRITION DIAGNOSIS:   Inadequate oral intake related to acute illness as evidenced by per patient/family report, percent weight loss.  GOAL:   Patient will meet greater than or equal to 90% of their needs  MONITOR:   Supplement acceptance, Labs, I & O's, PO intake  REASON FOR ASSESSMENT:   Malnutrition Screening Tool    ASSESSMENT:   Timothy [name] return to the clinic today for a scheduled visit. He receives intravenous fluids and intravenous magnesium every 2 weeks. He has multiple chronic complaints, which have been generally stable Today however before the visit his wife Timothy [name] called to tell Timothy [name] that he had some difficulty breathing and a bit of slurred speech. He has had more diarrhea recently, not in terms of frequency but in terms of volume. He has lost quite a bit of fluid there. He has had no cough or fevers despite the shortness of breath. He continues to problems with swollen feet. His blood sugars have been running low, even though he has been using less insulin per her report.  Spoke with pt at bedside. Reports that SOB has increased over a number of weeks along with blood glucose running low, even though he has been using less insulin.  Also reports 4# wt loss in 1 week.  Reports low blood sugars have hurt his appetite some.  Per chart, PO intake of 75% thus far.  No other wt loss or barriers to nutrition. Patient does have diarrhea regularly - related to bone marrow transplant. GVHD requires pt to be on constant corticosteroids - prednisone  Will provide snacks.  Labs and Medications reviewed.  Diet Order:  Diet Carb Modified Fluid consistency:: Thin; Room service appropriate?: Yes  Skin:  Reviewed, no issues  Last BM:  12/07/2015  Height:   Ht Readings from Last 1 Encounters:  12/07/15 5\' 6"  (1.676 m)    Weight:   Wt Readings from Last 1  Encounters:  12/07/15 216 lb (97.977 kg)    Ideal Body Weight:  64.54 kg  BMI:  Body mass index is 34.88 kg/(m^2).  Estimated Nutritional Needs:   Kcal:  1900-2400 calories  Protein:  90-100 grams protein  Fluid:  >/= 1.9L  EDUCATION NEEDS:   No education needs identified at this time  Timothy [name]. Lilly Gasser, MS, RD LDN After Hours/Weekend Pager [phone]

## 2015-12-08 ENCOUNTER — Inpatient Hospital Stay (HOSPITAL_COMMUNITY): Payer: BC Managed Care – PPO

## 2015-12-08 DIAGNOSIS — C9110 Chronic lymphocytic leukemia of B-cell type not having achieved remission: Secondary | ICD-10-CM

## 2015-12-08 DIAGNOSIS — D849 Immunodeficiency, unspecified: Secondary | ICD-10-CM

## 2015-12-08 DIAGNOSIS — Z8619 Personal history of other infectious and parasitic diseases: Secondary | ICD-10-CM

## 2015-12-08 DIAGNOSIS — Z856 Personal history of leukemia: Secondary | ICD-10-CM

## 2015-12-08 DIAGNOSIS — R251 Tremor, unspecified: Secondary | ICD-10-CM

## 2015-12-08 DIAGNOSIS — J189 Pneumonia, unspecified organism: Principal | ICD-10-CM

## 2015-12-08 LAB — CBC
HEMATOCRIT: 34.9 % — AB (ref 39.0–52.0)
Hemoglobin: 11.4 g/dL — ABNORMAL LOW (ref 13.0–17.0)
MCH: 29.9 pg (ref 26.0–34.0)
MCHC: 32.7 g/dL (ref 30.0–36.0)
MCV: 91.6 fL (ref 78.0–100.0)
PLATELETS: 114 10*3/uL — AB (ref 150–400)
RBC: 3.81 MIL/uL — ABNORMAL LOW (ref 4.22–5.81)
RDW: 17.5 % — AB (ref 11.5–15.5)
WBC: 5.7 10*3/uL (ref 4.0–10.5)

## 2015-12-08 LAB — COMPREHENSIVE METABOLIC PANEL
ALK PHOS: 73 U/L (ref 38–126)
ALT: 23 U/L (ref 17–63)
AST: 28 U/L (ref 15–41)
Albumin: 2.9 g/dL — ABNORMAL LOW (ref 3.5–5.0)
Anion gap: 10 (ref 5–15)
BILIRUBIN TOTAL: 0.4 mg/dL (ref 0.3–1.2)
BUN: 75 mg/dL — AB (ref 6–20)
CALCIUM: 7.6 mg/dL — AB (ref 8.9–10.3)
CO2: 12 mmol/L — ABNORMAL LOW (ref 22–32)
Chloride: 122 mmol/L — ABNORMAL HIGH (ref 101–111)
Creatinine, Ser: 3.22 mg/dL — ABNORMAL HIGH (ref 0.61–1.24)
GFR calc Af Amer: 21 mL/min — ABNORMAL LOW (ref >60)
GFR, EST NON AFRICAN AMERICAN: 18 mL/min — AB (ref >60)
Glucose, Bld: 186 mg/dL — ABNORMAL HIGH (ref 65–99)
POTASSIUM: 4.6 mmol/L (ref 3.5–5.1)
Sodium: 144 mmol/L (ref 135–145)
TOTAL PROTEIN: 5 g/dL — AB (ref 6.5–8.1)

## 2015-12-08 LAB — PROTEIN / CREATININE RATIO, URINE
Creatinine, Urine: 78.89 mg/dL
PROTEIN CREATININE RATIO: 0.37 mg/mg{creat} — AB (ref 0.00–0.15)
Total Protein, Urine: 29 mg/dL

## 2015-12-08 LAB — C DIFFICILE QUICK SCREEN W PCR REFLEX
C DIFFICILE (CDIFF) INTERP: NEGATIVE
C DIFFICILE (CDIFF) TOXIN: NEGATIVE
C DIFFICLE (CDIFF) ANTIGEN: NEGATIVE

## 2015-12-08 LAB — PTH, INTACT AND CALCIUM
CALCIUM TOTAL (PTH): 6.1 mg/dL — AB (ref 8.6–10.2)
PTH: 239 pg/mL — AB (ref 15–65)

## 2015-12-08 LAB — GLUCOSE, CAPILLARY
GLUCOSE-CAPILLARY: 155 mg/dL — AB (ref 65–99)
GLUCOSE-CAPILLARY: 104 mg/dL — AB (ref 65–99)
Glucose-Capillary: 171 mg/dL — ABNORMAL HIGH (ref 65–99)

## 2015-12-08 LAB — MAGNESIUM: MAGNESIUM: 1.9 mg/dL (ref 1.7–2.4)

## 2015-12-08 LAB — VITAMIN D 25 HYDROXY (VIT D DEFICIENCY, FRACTURES): Vit D, 25-Hydroxy: 6.5 ng/mL — ABNORMAL LOW (ref 30.0–100.0)

## 2015-12-08 LAB — SODIUM, URINE, RANDOM: SODIUM UR: 31 mmol/L

## 2015-12-08 LAB — TROPONIN I: TROPONIN I: 0.05 ng/mL — AB (ref <0.031)

## 2015-12-08 LAB — CREATININE, URINE, RANDOM: CREATININE, URINE: 78.84 mg/dL

## 2015-12-08 LAB — LACTATE DEHYDROGENASE: LDH: 312 U/L — AB (ref 98–192)

## 2015-12-08 MED ORDER — SULFAMETHOXAZOLE-TRIMETHOPRIM 400-80 MG/5ML IV SOLN
7.5000 mg/kg/d | Freq: Two times a day (BID) | INTRAVENOUS | Status: DC
  Administered 2015-12-08 – 2015-12-11 (×5): 291.2 mg via INTRAVENOUS
  Filled 2015-12-08 (×9): qty 18.2

## 2015-12-08 MED ORDER — SODIUM BICARBONATE 650 MG PO TABS
1300.0000 mg | ORAL_TABLET | Freq: Two times a day (BID) | ORAL | Status: DC
  Administered 2015-12-08 – 2015-12-10 (×5): 1300 mg via ORAL
  Filled 2015-12-08 (×5): qty 2

## 2015-12-08 MED ORDER — FUROSEMIDE 10 MG/ML IJ SOLN
80.0000 mg | Freq: Two times a day (BID) | INTRAMUSCULAR | Status: DC
  Administered 2015-12-08 – 2015-12-09 (×2): 80 mg via INTRAVENOUS
  Filled 2015-12-08 (×2): qty 8

## 2015-12-08 MED ORDER — SODIUM CHLORIDE 0.9 % IV SOLN
2.0000 g | Freq: Two times a day (BID) | INTRAVENOUS | Status: DC
  Administered 2015-12-08 – 2015-12-11 (×7): 2 g via INTRAVENOUS
  Filled 2015-12-08 (×9): qty 20

## 2015-12-08 MED ORDER — SODIUM BICARBONATE 8.4 % IV SOLN
INTRAVENOUS | Status: DC
  Administered 2015-12-08: 13:00:00 via INTRAVENOUS
  Filled 2015-12-08 (×2): qty 100

## 2015-12-08 MED ORDER — HYDROCORTISONE 2.5 % RE CREA
TOPICAL_CREAM | Freq: Three times a day (TID) | RECTAL | Status: DC | PRN
  Administered 2015-12-08: 18:00:00 via RECTAL
  Filled 2015-12-08: qty 28.35

## 2015-12-08 NOTE — Progress Notes (Signed)
[facility] Progress Note   Subjective: breathing a little better, creat down slightly  Filed Vitals:   12/07/15 1000 12/07/15 1538 12/07/15 2043 12/08/15 0542  BP:  131/74 130/70 146/83  Pulse:  77 74 71  Temp:  97.3 F (36.3 C) 97.7 F (36.5 C) 97.9 F (36.6 C)  TempSrc:  Oral Oral Oral  Resp:  '18 22 20  '$ Height: '5\' 6"'$  (1.676 m)     Weight: 97.977 kg (216 lb)     SpO2:  96% 98% 96%    Inpatient medications: . acyclovir  800 mg Oral BID  . budesonide  3 mg Oral TID  . calcium gluconate  2 g Intravenous Q8H  . cholestyramine  4 g Oral BID  . diltiazem  240 mg Oral Daily  . enoxaparin (LOVENOX) injection  30 mg Subcutaneous Q24H  . fluconazole  100 mg Oral Daily  . furosemide  80 mg Intravenous Q12H  . insulin aspart  0-20 Units Subcutaneous TID WC  . insulin aspart  6 Units Subcutaneous TID WC  . labetalol  400 mg Oral BID  . loratadine  10 mg Oral Daily  . pantoprazole  40 mg Oral Daily  . predniSONE  10 mg Oral Q breakfast  . sertraline  50 mg Oral q morning - 10a  . sodium bicarbonate  1,300 mg Oral BID  . tacrolimus  1.5 mg Oral BID   . dextrose 5 % and 0.45% NaCl 50 mL/hr at 12/06/15 1734   HYDROcodone-acetaminophen, morphine injection, ondansetron, prochlorperazine, technetium TC 56M diethylenetriame-pentaacetic acid  Exam: Alert, no distress, mild ^WOB w talking No jvd flat neck veins Chest coarse bibasilar rales RRR no mrg Abd obese no ascites or abd wall edema Ext distal edema 2+ pretib / pedal, no UE edema Neuro Ox 3, nf, no asterixis  UA negative 12/16 CXR 12/16 focal R basilar densities, fluffy R perhilar infiltrates CT chest focal nodular infiltrates R > L bases, R perihilar confluent density w air bronchograms Renal US no acute changes V/Q low prob ECHO mod-sev LVH, LVEF 45-50%, IVC normal size  Assessment: 1 Acute on CKD4- unclear cause. Creat down a little, on po lasix.  2 SOB/ pulm infiltrates- PNA vs edema 3 Met acidosis  - on po bicarb 4 S/P autogeneic SCT for CLL 2013 5 Chronic GVHD, skin / GI 6 Chron diarrhea 7 Chron immunosuppression (tac/ pred) 8 HTN dilt/ labetalol, controlled 9 Hypomagnesemia  Plan - cont po lasix, creat in am   Kelly Splinter MD [facility] pager [phone]    cell [phone] 12/07/2015, 3:30 PM    Recent Labs Lab 12/06/15 1440 12/06/15 2150 12/07/15 1010 12/08/15 0440  NA 141  --  142 144  K 4.8  --  4.4 4.6  CL 118*  --  117* 122*  CO2 12*  --  13* 12*  GLUCOSE 151*  --  99 186*  BUN 82*  --  78* 75*  CREATININE 3.77*  --  3.65* 3.22*  CALCIUM 5.8* 6.1* 6.2* 7.6*  PHOS 5.7*  --   --   --     Recent Labs Lab 12/06/15 1440 12/07/15 1010 12/08/15 0440  AST 35 32 28  ALT '29 27 23  '$ ALKPHOS 83 76 73  BILITOT 0.4 0.6 0.4  PROT 5.7* 5.6* 5.0*  ALBUMIN 3.5 3.3* 2.9*    Recent Labs Lab 12/06/15 1138 12/06/15 1440 12/08/15 0440  WBC 6.7 5.4 5.7  NEUTROABS 5.7 4.4  --  HGB 13.1 12.1* 11.4*  HCT 39.4 36.5* 34.9*  MCV 90.6 91.5 91.6  PLT 107* 120* 114*

## 2015-12-08 NOTE — Progress Notes (Signed)
Timothy [name]   DOB:[birth year]   GB#:[id number]   PXT#:[id number]  Subjective: feel about the same, mostly staying in bed; last BM 12/16 afternoon.Minimal cough, no production. No rash, no bleeding. No family in room   Objective: middle aged White male examined in bed Filed Vitals:   12/07/15 2043 12/08/15 0542  BP: 130/70 146/83  Pulse: 74 71  Temp: 97.7 F (36.5 C) 97.9 F (36.6 C)  Resp: 22 20    Body mass index is 34.88 kg/(m^2).  Intake/Output Summary (Last 24 hours) at 12/08/15 0949 Last data filed at 12/08/15 0350  Gross per 24 hour  Intake      0 ml  Output   1000 ml  Net  -1000 ml     Sclerae unicteric  Oropharynx no thrush  No peripheral adenopathy  Lungs no wheezes-- auscultated anterolaterally  Heart regular rate and rhythm  Abdomen soft, obese, NT, +BS  MSK bilat ankle edema as previously noted  Neuro nonfocal  Skin: purpura secondary to steroids, no rash  CBG (last 3)   Recent Labs  12/07/15 1739 12/07/15 2123 12/08/15 0815  GLUCAP 128* 143* 155*     Labs:  Lab Results  Component Value Date   WBC 5.7 12/08/2015   HGB 11.4* 12/08/2015   HCT 34.9* 12/08/2015   MCV 91.6 12/08/2015   PLT 114* 12/08/2015   NEUTROABS 4.4 12/06/2015    '@LASTCHEMISTRY'$ @  Urine Studies No results for input(s): UHGB, CRYS in the last 72 hours.  Invalid input(s): UACOL, UAPR, USPG, UPH, UTP, UGL, UKET, UBIL, UNIT, UROB, ULEU, UEPI, UWBC, URBC, UBAC, CAST, [place], [place]  Basic Metabolic Panel:  Recent Labs Lab 12/06/15 1139 12/06/15 1139  12/06/15 1440 12/06/15 2150 12/07/15 1010 12/08/15 0440  NA 140  --   --  141  --  142 144  K 5.0  --   < > 4.8  --  4.4 4.6  CL  --   --   --  118*  --  117* 122*  CO2 10*  --   --  12*  --  13* 12*  GLUCOSE 40*  --   --  151*  --  99 186*  BUN 82.9*  --   --  82*  --  78* 75*  CREATININE 3.7*  --   --  3.77*  --  3.65* 3.22*  CALCIUM 6.1*  --   --  5.8* 6.1* 6.2* 7.6*  MG  --  1.4*  --  1.3*  --   --  1.9  PHOS  --    --   --  5.7*  --   --   --   < > = values in this interval not displayed. GFR Estimated Creatinine Clearance: 23.7 mL/min (by C-G formula based on Cr of 3.22). Liver Function Tests:  Recent Labs Lab 12/06/15 1139 12/06/15 1440 12/07/15 1010 12/08/15 0440  AST 35* 35 32 28  ALT 34 '29 27 23  '$ ALKPHOS 92 83 76 73  BILITOT 0.43 0.4 0.6 0.4  PROT 6.0* 5.7* 5.6* 5.0*  ALBUMIN 3.6 3.5 3.3* 2.9*   No results for input(s): LIPASE, AMYLASE in the last 168 hours. No results for input(s): AMMONIA in the last 168 hours. Coagulation profile No results for input(s): INR, PROTIME in the last 168 hours.  CBC:  Recent Labs Lab 12/06/15 1138 12/06/15 1440 12/08/15 0440  WBC 6.7 5.4 5.7  NEUTROABS 5.7 4.4  --   HGB 13.1 12.1* 11.4*  HCT  39.4 36.5* 34.9*  MCV 90.6 91.5 91.6  PLT 107* 120* 114*   Cardiac Enzymes:  Recent Labs Lab 12/06/15 1440 12/06/15 2150 12/07/15 0200 12/07/15 1010 12/08/15 0440  CKTOTAL  --   --   --  102  --   CKMB  --   --   --  10.5*  --   TROPONINI 0.04* 0.05* 0.05*  --  0.05*   BNP: Invalid input(s): POCBNP CBG:  Recent Labs Lab 12/07/15 0903 12/07/15 1145 12/07/15 1739 12/07/15 2123 12/08/15 0815  GLUCAP 94 99 128* 143* 155*   D-Dimer No results for input(s): DDIMER in the last 72 hours. Hgb A1c No results for input(s): HGBA1C in the last 72 hours. Lipid Profile No results for input(s): CHOL, HDL, LDLCALC, TRIG, CHOLHDL, LDLDIRECT in the last 72 hours. Thyroid function studies  Recent Labs  12/06/15 1440  TSH 0.382   Anemia work up No results for input(s): VITAMINB12, FOLATE, FERRITIN, TIBC, IRON, RETICCTPCT in the last 72 hours. Microbiology No results found for this or any previous visit (from the past 240 hour(s)).    Studies:  Dg Chest 2 View  12/07/2015  CLINICAL DATA:  Shortness of breath, pre VQ scan. EXAM: CHEST  2 VIEW COMPARISON:  Chest CT 12/06/2015 FINDINGS: Focal consolidation noted in the right lung base  concerning for pneumonia, this is similar to prior CT. Minimal left basilar density, also similar. Heart is mildly enlarged. No effusions. No acute bony abnormality. IMPRESSION: Stable appearance of the bilateral lower lobe airspace opacities, right greater than left concerning for pneumonia. Electronically Signed   By: Rolm Baptise M.D.   On: 12/07/2015 12:25   Ct Chest Wo Contrast  12/06/2015  CLINICAL DATA:  Shortness of breath. Kidney failure. Hypocalcemia. Hypoglycemia. History of leukemia, status post bone marrow transplant in 2013. EXAM: CT CHEST WITHOUT CONTRAST TECHNIQUE: Multidetector CT imaging of the chest was performed following the standard protocol without IV contrast. COMPARISON:  02/19/2015 FINDINGS: Mediastinum/Nodes: The right-sided Port-A-Cath which terminates at the superior caval/ atrial junction. Tortuous descending thoracic aorta. Mild cardiomegaly. Multivessel coronary artery atherosclerosis. No mediastinal or definite hilar adenopathy, given limitations of unenhanced CT. Lungs/Pleura: Tiny right-sided pleural effusion. Right worse than left bibasilar and dependent upper lobe airspace and ground-glass opacity with mild septal thickening. Upper abdomen: Trace perihepatic ascites. Normal imaged portions of the spleen, stomach, adrenal glands, kidneys. The gallbladder is incompletely imaged but may be distended. This is similar to on the prior exam. No calcified stone. Subtle edema in the porta hepatis, including on image 56/series 2. Musculoskeletal: Remote left-sided rib fractures. Osteopenia. Advanced thoracolumbar spondylosis. Mild superior endplate compression deformity at T4 is chronic. Interval healing of previously described T10 compression deformity. A moderate T12 compression deformity is not significantly changed. IMPRESSION: 1. Bilateral airspace and ground-glass opacities, favor infection. Given dependent distribution, aspiration could look similar. 2.  Atherosclerosis,  including within the coronary arteries. 3. Cardiomegaly with small right pleural effusion. 4. Nonspecific gallbladder distension. Suggestion of edema in the porta hepatis. Correlate with upper abdominal symptoms. Consider correlation with pancreatic enzyme levels to exclude pancreatitis. Depending on symptomatology, abdominal ultrasound or CT may be informative. 5. Trace perihepatic ascites. Electronically Signed   By: Abigail Miyamoto M.D.   On: 12/06/2015 18:46   Nm Pulmonary Perf And Vent  12/07/2015  CLINICAL DATA:  Shortness of breath for 1 and half weeks. Lower extremity swelling. EXAM: NUCLEAR MEDICINE VENTILATION - PERFUSION LUNG SCAN TECHNIQUE: Ventilation images were obtained in multiple projections using  inhaled aerosol Tc-87mDTPA. Perfusion images were obtained in multiple projections after intravenous injection of Tc-954mAA. RADIOPHARMACEUTICALS:  31 millicurie TeHUDJSHFWYO-37CTPA aerosol inhalation and 4.5.88illicurie TeFOYDXAJOIN-86VAA IV COMPARISON:  12/07/2015 FINDINGS: Ventilation: No focal ventilation defect. Central scratch set deposition of the radiopharmaceutical within the central airway is noted. Perfusion: No wedge shaped peripheral perfusion defects to suggest acute pulmonary embolism. IMPRESSION: Low probability for acute pulmonary embolus. Electronically Signed   By: TaKerby Moors.D.   On: 12/07/2015 15:38   [place]  12/06/2015  CLINICAL DATA:  Acute renal failure EXAM: RENAL / URINARY TRACT ULTRASOUND COMPLETE COMPARISON:  02/19/2015 FINDINGS: Right Kidney: Length: 11.6 cm. Echogenicity within normal limits. No mass or hydronephrosis visualized. Left Kidney: Length: 10.9 cm. Echogenicity within normal limits. No mass or hydronephrosis visualized. Bladder: Limit assessment.  Under distended.  Grossly unremarkable. IMPRESSION: No acute finding by renal ultrasound. Electronically Signed   By: M.Jerilynn Mages Shick M.D.   On: 12/06/2015 21:13    Assessment: [age].o. [place] man with  a history of well-differentiated lymphocytic lymphoma/ chronic lymphoid leukemia initially diagnosed in 2000, not requiring intervention until 2006; s/p allogeneic transplant March 2013, now admitted with worsening SOB, bilateral pulmonary infiltrates and acute on chronic kidney injury, but no fever, mucositis, or rash. His CLL history is as follows:  (1) fludarabine/cyclophosphamide/rituximab x5 completed May 2007.   (2) rituximab for 8 doses October 2010, with partial response   (3) Leustatin and ofatumumab weekly x8 July to September 2011 followed by maintenance ofatumumab every 2 months, with initial response but rising counts September 2012   (4) status-post unrelated donor stem-cell transplant 02/24/2012 at the [facility](a) conditioning regimen consisted of fludarabine + TBI at 200 cGy, followed by rituximab x27;  (b) CMV reactivation x3 (patient CMV positive, donor negative), s/p ganciclovir treatment; 3d reactivation August 2013, s/p gancyclovir, with negative PCR mid-September 2013; last gancyclovir dose 10/06/2012 (c) Chronic GVHD: involving gut and skin, treated with steroids, tacrolimus and MMF. MMF was eventually d/c'd and tacrolimus currently at a dose of 1.'5mg'$  BID (d) atrial fibrillation: resolved on brief amiodarone regimen (e) steroid-induced myopathy: improving  (f) hypomagnesemia: improved after d/c gancyclovir, needs continuing support (g) hypogammaglobulinemia: requiring IVIG most recently 08/03/2014. (h) history of elevated triglycerides (606 on 07/14/2012)  (i) adrenal insufficiency: on prednisone and budesonide (j) pancytopenia,resolved (k) brief episode of neutropenia ([id number]) February 2015, accompanied by diarrhea; resolved   (5) restaging studies February-March 2015 including CT scans, flow cytometry, and bone marrow biopsy, showed no evidence of residual chronic lymphoid leukemia. (a) repeat bone marrow biopsy 02/27/2015 showed no evidence of  chronic lymphoid leukemia and also no dyspoiesis. Flow cytometry showed no B cells.  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 Status post kyphoplasty at L3-4 in June 2014. Also with evidence of rib fractures and insufficiency fractures bilaterally of the sacral alae, noted by CT in March 2015. -- Denosumab started 12/08/2013, given as prolia Q6 months which is what has been approved by his insurance, most recent dose 03/29/2015  (12) chronic back pain and hip pain controlled with  OxyContin and hydrocodone/APAP.  (12) nausea: well controlled on current meds  (13) Positive c.diff, 03/08/2013, on Flagyl 500 mg TID x 20 days, then on oral vanco with Questran, showing improvement; positive when repeated April 2014; Negative x 3 since then; repeat 12/07/2015 pending  (14) persistently increased BUN and potassium: Improved on daily bicarbonate  (15) Hypertension, on labetalol, cardizem, lisinopril, and furosemide; managed by Dr. Brigitte Pulse  (16) steroid induced hyperglycemia/ DM II: managed by Dr Brigitte Pulse; was severely hypoglycemic on admission, currently on SSI   (17) hypogammaglobulinemia-- requiring intermittent supplementation, most recent dose 10/18/2015  (18) squamous cell CA in situ removed from left parietal scalp October 2014  (19) severe SOB with pulmonary infiltrates: workup so far has included   (a) echo 12/06/2015 shows EF 45-50% and no wall motion abnormalities  (b) EKG shows new (c/w 2014)  left fascicular block, old RBBB, no ischemic changes  (c) troponin is minimally and persistently elevated, not c/w MI  (d) V/Q scan 12/07/2015 low probability  (e) negative influenza panel    Plan: Dshawn's lung problems could be due to graft vs host disease but he shows no rash or mucositis, and the worsening diarrhea he experiecned past 72 hors appears to be resolving (no BM past 18h; c diff pending). The lung issues could be due to drug toxicity and a tacrolimus level is pending; however he has been on this drug for >3 years w/o problems. Accordingly despite the absence of fever I think we are dealing with infection and the differential is vast. I have asked ID to give [name] a hand in workup and empiric therapy as appropriate. PUL plans BAL 12/10/2015  Will repeat CXR today. Encouraged him to stay OOBTO during the day. Will change IVF to D5W w HCO3 and follow creat and electrolytes, which are improved.  Chauncey Cruel, MD 12/08/2015  9:49 AM Medical Oncology and Hematology [facility] [address] [place], [place] [address] Tel. [phone]    Fax. [phone]

## 2015-12-08 NOTE — Progress Notes (Signed)
Name: Timothy [name] MRN: [id number] DOB: [birth year]    ADMISSION DATE:  12/06/2015 CONSULTATION DATE:  12/07/15  REFERRING MD :  Dr. Vinie [name]   CHIEF COMPLAINT:  Basilar Infiltrates, Hypoxemic Respiratory Failure   BRIEF PATIENT DESCRIPTION: 69 y/o M with PMH of   SIGNIFICANT EVENTS  12/15  Admit from [facility] with multiple c/o's - more diarrhea, SOB, slurred speech, low blood sugars  STUDIES:  12/15  CT Chest >> bilateral airspace & ground glass opacities, atherosclerosis in coronary arteries, cardiomegaly, small R pleural effusion, non-specific gallbladder distention, trace perihepatic ascites  12/15  ECHO >> LV with mod to severe LVH, mild reduction of systolic fxn, EF [id number], No RWMA, grade 1 diastolic dysfunction, mild mitral regurg, mild aortic regurg 12/16  VQ >>    HISTORY OF PRESENT ILLNESS:  69 y/o M with PMH of CKD II, obesity, HTN, hiatal hernia, RBB, AF, CLL s/p allogenic stem-cell transplant, chronic graft-vs host disease, hypomagnesemia requiring Q2 weekly IV infusion, and chronic diarrhea who was admitted from the [place] [facility] after a routine visit with multiple complaints.    In office the patient & wife reported he had noted an increase in chronic diarrhea (volume), shortness of breath but no cough/sputum production, lower extremity swelling, low blood sugars and slurred speech.  Office notes reflect no change in lower extremity swelling, glucose assessed and 40.  Serum creatinine increased to 3.7 from 2.3, calcium 6.1, albumin 3.6 and magnesium 1.4 (in office).  The patient was treated with orange juice and cookies with improvement in glucose.  He was admitted by Dr. Jana [name] for further evaluation.    CT evaluation of chest demonstrated bilateral airspace & ground glass opacities, atherosclerosis in coronary arteries, cardiomegaly, small R pleural effusion, non-specific gallbladder distention, trace perihepatic ascites.  The patient was placed on 3L O2  (unfortunately, I can not find a documented saturation from the cancer center).  Initial O2 saturations in hospital were documented on oxygen.  Given basilar infiltrates and immunocompromised status, PCCM consulted for evaluation.      SUBJECTIVE:  NAD at rest  VITAL SIGNS: Temp:  [97.3 F (36.3 C)-97.9 F (36.6 C)] 97.9 F (36.6 C) (12/17 0542) Pulse Rate:  [71-77] 71 (12/17 0542) Resp:  [18-22] 20 (12/17 0542) BP: (130-146)/(70-83) 146/83 mmHg (12/17 0542) SpO2:  [96 %-98 %] 96 % (12/17 0542) Weight:  [216 lb (97.977 kg)] 216 lb (97.977 kg) (12/16 1000)  PHYSICAL EXAMINATION: General:  Chronically ill appearing male in NAD, sitting on toilet Neuro:  AAOx4, speech clear, MAE HEENT:  MM pink/moist, no jvd  Cardiovascular:  s1s2 regular, distant tones, palpable peripheral pulses  Lungs:  Even/non-labored, basilar crackles  Abdomen:  Round/soft, bsx4 active, non-tender  Musculoskeletal:  No acute deformities  Skin:  Warm/dry, thin, scattered bruises    Recent Labs Lab 12/06/15 1440 12/07/15 1010 12/08/15 0440  NA 141 142 144  K 4.8 4.4 4.6  CL 118* 117* 122*  CO2 12* 13* 12*  BUN 82* 78* 75*  CREATININE 3.77* 3.65* 3.22*  GLUCOSE 151* 99 186*    Recent Labs Lab 12/06/15 1138 12/06/15 1440 12/08/15 0440  HGB 13.1 12.1* 11.4*  HCT 39.4 36.5* 34.9*  WBC 6.7 5.4 5.7  PLT 107* 120* 114*   Dg Chest 2 View  12/07/2015  CLINICAL DATA:  Shortness of breath, pre VQ scan. EXAM: CHEST  2 VIEW COMPARISON:  Chest CT 12/06/2015 FINDINGS: Focal consolidation noted in the right lung base concerning for pneumonia, this is  similar to prior CT. Minimal left basilar density, also similar. Heart is mildly enlarged. No effusions. No acute bony abnormality. IMPRESSION: Stable appearance of the bilateral lower lobe airspace opacities, right greater than left concerning for pneumonia. Electronically Signed   By: Timothy [name] M.D.   On: 12/07/2015 12:25   Ct Chest Wo  Contrast  12/06/2015  CLINICAL DATA:  Shortness of breath. Kidney failure. Hypocalcemia. Hypoglycemia. History of leukemia, status post bone marrow transplant in 2013. EXAM: CT CHEST WITHOUT CONTRAST TECHNIQUE: Multidetector CT imaging of the chest was performed following the standard protocol without IV contrast. COMPARISON:  02/19/2015 FINDINGS: Mediastinum/Nodes: The right-sided Port-A-Cath which terminates at the superior caval/ atrial junction. Tortuous descending thoracic aorta. Mild cardiomegaly. Multivessel coronary artery atherosclerosis. No mediastinal or definite hilar adenopathy, given limitations of unenhanced CT. Lungs/Pleura: Tiny right-sided pleural effusion. Right worse than left bibasilar and dependent upper lobe airspace and ground-glass opacity with mild septal thickening. Upper abdomen: Trace perihepatic ascites. Normal imaged portions of the spleen, stomach, adrenal glands, kidneys. The gallbladder is incompletely imaged but may be distended. This is similar to on the prior exam. No calcified stone. Subtle edema in the porta hepatis, including on image 56/series 2. Musculoskeletal: Remote left-sided rib fractures. Osteopenia. Advanced thoracolumbar spondylosis. Mild superior endplate compression deformity at T4 is chronic. Interval healing of previously described T10 compression deformity. A moderate T12 compression deformity is not significantly changed. IMPRESSION: 1. Bilateral airspace and ground-glass opacities, favor infection. Given dependent distribution, aspiration could look similar. 2.  Atherosclerosis, including within the coronary arteries. 3. Cardiomegaly with small right pleural effusion. 4. Nonspecific gallbladder distension. Suggestion of edema in the porta hepatis. Correlate with upper abdominal symptoms. Consider correlation with pancreatic enzyme levels to exclude pancreatitis. Depending on symptomatology, abdominal ultrasound or CT may be informative. 5. Trace perihepatic  ascites. Electronically Signed   By: Timothy [name] M.D.   On: 12/06/2015 18:46   Nm Pulmonary Perf And Vent  12/07/2015  CLINICAL DATA:  Shortness of breath for 1 and half weeks. Lower extremity swelling. EXAM: NUCLEAR MEDICINE VENTILATION - PERFUSION LUNG SCAN TECHNIQUE: Ventilation images were obtained in multiple projections using inhaled aerosol Tc-53m DTPA. Perfusion images were obtained in multiple projections after intravenous injection of Tc-43m MAA. RADIOPHARMACEUTICALS:  31 millicurie [id number] DTPA aerosol inhalation and 0000000 millicurie [id number] MAA IV COMPARISON:  12/07/2015 FINDINGS: Ventilation: No focal ventilation defect. Central scratch set deposition of the radiopharmaceutical within the central airway is noted. Perfusion: No wedge shaped peripheral perfusion defects to suggest acute pulmonary embolism. IMPRESSION: Low probability for acute pulmonary embolus. Electronically Signed   By: Kerby Moors M.D.   On: 12/07/2015 15:38   US Renal Port  12/06/2015  CLINICAL DATA:  Acute renal failure EXAM: RENAL / URINARY TRACT ULTRASOUND COMPLETE COMPARISON:  02/19/2015 FINDINGS: Right Kidney: Length: 11.6 cm. Echogenicity within normal limits. No mass or hydronephrosis visualized. Left Kidney: Length: 10.9 cm. Echogenicity within normal limits. No mass or hydronephrosis visualized. Bladder: Limit assessment.  Under distended.  Grossly unremarkable. IMPRESSION: No acute finding by renal ultrasound. Electronically Signed   By: Jerilynn Mages.  Shick M.D.   On: 12/06/2015 21:13    ASSESSMENT / PLAN:   Bilateral Infiltrates / Ground Glass Opacities - differential includes bacterial / viral illness, high risk for opportunistic infection Small Right Pleural Effusion  Acute Hypoxic Respiratory Failure   Plan: Will arrange for FOB, planned for 12/19 @ 0800   NPO after MN on 12/19 Oxygen as needed to support saturations > 90% Will  need ambulatory assessment prior to discharge  Intermittent  CXR  V/Q results = low proc PE Pulmonary hygiene:  IS, mobilize Lasix 80 mg BID  Assess influenza panel  Hold abx for now as pt is afebrile and no increase in WBC Assess PCT  CLL  Chronic Graft vs Host Disease   Plan: Continue baseline prednisone 10mg  QD, acyclovir, diflucan,  Plan per Dr. Levie Heritage MD Beeper  [phone]  Cell  [phone]  If no response or cell goes to voicemail, call beeper [phone]   12/08/2015, 9:30 AM

## 2015-12-08 NOTE — Consult Note (Addendum)
[facility] Infectious Disease  Total days of antibiotics 3        Day 3 acyclovir/fluconazole               Reason for Consult: bilateral infiltrates in immunocompromise host    Referring Physician: magrinat  Active Problems:   Shortness of breath   PNA (pneumonia)    HPI: Timothy [name] is a 69 y.o. male  With CLL initially dx in 2000, treated with fludarabine, cytoxan, rituxan x 5 cycles in 2006-2007. Had recurrence, went to [facility] for alloSCTxp on 02/2012 (CMV D-/R+) complicated by CMV reactivation in Aug and Oct 2013 treated with 3 wk of ganciclovir. He also has chronic GVHD, dx in dec 2013 presented with rash, diarrhea, confirmed on BM bx. Started on tacro, MMP, pred, but developed CKD though to be exacerbated by cellcept and bactrim proph, thus now only on tacro 1.$RemoveBe'5mg'ldlZgNzPI$  BID plus pred $RemoveB'10mg'rfCWvqUJ$  daily, and prophylaxis with fluconazole and acyclovir. He follows in Dr. Virgie Dad clinic every weeks to receive IV fluids, electrolyte repletion. Dr. Joelyn Oms for CKD management. He reports that he and his wife suffered from gastroenteritis in mid to late November, noted to be weaker than baseline with mild shortness of breath in his early December appointment. He was seen again on Dec 15th in clinic where he worsening shortness of breath, complaints of chills, in addition to worsening malaise, weakness. 5 lb weight loss from decreased appetite and ongoing, increased diarrhea, more volume than baseline. Lab work showed worsening aki with cr BL of 2.3 up to 3.7. His chest CT shows bilateral upper lung ground glass appearance, airspace disease. He was admitted for further management. ID and pulmonary consulted to help with work up and management. He is also more tremulous during this admission that he attributes to tacrolimus.\  They had trees cut down in their neighborhood but wife reports respiratory symptoms started likely early december  Past Medical History  Diagnosis Date  . Transplant  recipient 07/12/2012  . Diverticular disease   . Hyperlipidemia   . Obesity   . Hypertension   . Hiatal hernia   . CMV (cytomegalovirus) antibody positive     pre-transplant, with seroconversion x2 pst-transplant  . Right bundle branch block     pre-transplant  . CKD (chronic kidney disease) stage 2, GFR 60-89 ml/min   . Pancytopenia ([place])   . Atrial fibrillation ([place])     post-transplant  . Myopathy   . 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 Chronic GVHD complicating bone marrow transplantation ([place]) 12/05/2012  . Diarrhea in adult patient 12/05/2012    Due to active GVHD  . Rash of face 12/05/2012    Due to GVHD  . Hypomagnesemia 01/26/2013  . Left hip pain 12/01/2013  . Steroid-induced diabetes ([place])     novalog  . Leukemia, chronic lymphoid ([place])   . CLL (chronic lymphocytic leukemia) ([place]) 12/05/2012    Dx 07/1999; started Rx 12/06  AlloBMT 3/13    Allergies:  Allergies  Allergen Reactions  . Benadryl [Diphenhydramine Hcl]     "Restless leg syndrome"     MEDICATIONS: . acyclovir  800 mg Oral BID  . budesonide  3 mg Oral TID  . calcium gluconate  2 g Intravenous Q12H  . cholestyramine  4 g Oral BID  . diltiazem  240 mg Oral Daily  . enoxaparin (LOVENOX) injection  30 mg Subcutaneous Q24H  . fluconazole  100 mg  Oral Daily  . furosemide  80 mg Intravenous Q12H  . insulin aspart  0-20 Units Subcutaneous TID WC  . insulin aspart  6 Units Subcutaneous TID WC  . labetalol  400 mg Oral BID  . loratadine  10 mg Oral Daily  . pantoprazole  40 mg Oral Daily  . predniSONE  10 mg Oral Q breakfast  . sertraline  50 mg Oral q morning - 10a  . sodium bicarbonate  1,300 mg Oral BID  . tacrolimus  1.5 mg Oral BID    Social History  Substance Use Topics  . Smoking status: Never Smoker   . Smokeless tobacco: Never Used  . Alcohol Use: No    Family History  Problem Relation Age of Onset  . Cancer Father     Review of Systems - See hpi for ROS. Positive  pertinents listed in hpi. Otherwise 10 point ros is negative  OBJECTIVE: Temp:  [97.3 F (36.3 C)-97.9 F (36.6 C)] 97.9 F (36.6 C) (12/17 0542) Pulse Rate:  [71-77] 71 (12/17 0542) Resp:  [18-22] 20 (12/17 0542) BP: (130-146)/(70-83) 146/83 mmHg (12/17 0542) SpO2:  [96 %-98 %] 96 % (12/17 0542) Physical Exam  Constitutional: He is oriented to person, place, and time. He appears chronically ill. No distress. Wearing nasal cannula HENT:  Mouth/Throat: Oropharynx is clear and moist. No oropharyngeal exudate.  Cardiovascular: Normal rate, regular rhythm and normal heart sounds. Exam reveals no gallop and no friction rub.  No murmur heard.  Pulmonary/Chest: Effort normal and breath sounds normal. No respiratory distress. Mild inspiratory crackles at right base.  Abdominal: Soft. Bowel sounds are normal. He exhibits no distension. There is no tenderness.  Lymphadenopathy:  He has no cervical adenopathy.  Neurological: He is alert and oriented to person, place, and time.  Skin: Skin is warm and dry. echymosis from  Psychiatric: He has a normal mood and affect. His behavior is normal.     LABS: Results for orders placed or performed during the hospital encounter of 12/06/15 (from the past 48 hour(s))  Comprehensive metabolic panel     Status: Abnormal   Collection Time: 12/06/15  2:40 PM  Result Value Ref Range   Sodium 141 135 - 145 mmol/L   Potassium 4.8 3.5 - 5.1 mmol/L   Chloride 118 (H) 101 - 111 mmol/L   CO2 12 (L) 22 - 32 mmol/L   Glucose, Bld 151 (H) 65 - 99 mg/dL   BUN 82 (H) 6 - 20 mg/dL   Creatinine, Ser 3.77 (H) 0.61 - 1.24 mg/dL   Calcium 5.8 (LL) 8.9 - 10.3 mg/dL    Comment: CRITICAL RESULT CALLED TO, READ BACK BY AND VERIFIED WITH: ACQUAAAH E RN 1535 ON 12.15.16 BY MENDOZA B     Total Protein 5.7 (L) 6.5 - 8.1 g/dL   Albumin 3.5 3.5 - 5.0 g/dL   AST 35 15 - 41 U/L   ALT 29 17 - 63 U/L   Alkaline Phosphatase 83 38 - 126 U/L   Total Bilirubin 0.4 0.3 - 1.2 mg/dL    GFR calc non Af Amer 15 (L) >60 mL/min   GFR calc Af Amer 17 (L) >60 mL/min    Comment: (NOTE) The eGFR has been calculated using the CKD EPI equation. This calculation has not been validated in all clinical situations. eGFR's persistently <60 mL/min signify possible Chronic Kidney Disease.    Anion gap 11 5 - 15  Magnesium     Status: Abnormal   Collection  Time: 12/06/15  2:40 PM  Result Value Ref Range   Magnesium 1.3 (L) 1.7 - 2.4 mg/dL  Phosphorus     Status: Abnormal   Collection Time: 12/06/15  2:40 PM  Result Value Ref Range   Phosphorus 5.7 (H) 2.5 - 4.6 mg/dL  CBC WITH DIFFERENTIAL     Status: Abnormal   Collection Time: 12/06/15  2:40 PM  Result Value Ref Range   WBC 5.4 4.0 - 10.5 K/uL   RBC 3.99 (L) 4.22 - 5.81 MIL/uL   Hemoglobin 12.1 (L) 13.0 - 17.0 g/dL   HCT 36.5 (L) 39.0 - 52.0 %   MCV 91.5 78.0 - 100.0 fL   MCH 30.3 26.0 - 34.0 pg   MCHC 33.2 30.0 - 36.0 g/dL   RDW 17.4 (H) 11.5 - 15.5 %   Platelets 120 (L) 150 - 400 K/uL   Neutrophils Relative % 82 %   Neutro Abs 4.4 1.7 - 7.7 K/uL   Lymphocytes Relative 14 %   Lymphs Abs 0.7 0.7 - 4.0 K/uL   Monocytes Relative 4 %   Monocytes Absolute 0.2 0.1 - 1.0 K/uL   Eosinophils Relative 0 %   Eosinophils Absolute 0.0 0.0 - 0.7 K/uL   Basophils Relative 0 %   Basophils Absolute 0.0 0.0 - 0.1 K/uL  TSH     Status: None   Collection Time: 12/06/15  2:40 PM  Result Value Ref Range   TSH 0.382 0.350 - 4.500 uIU/mL  Troponin I     Status: Abnormal   Collection Time: 12/06/15  2:40 PM  Result Value Ref Range   Troponin I 0.04 (H) <0.031 ng/mL    Comment:        PERSISTENTLY INCREASED TROPONIN VALUES IN THE RANGE OF 0.04-0.49 ng/mL CAN BE SEEN IN:       -UNSTABLE ANGINA       -CONGESTIVE HEART FAILURE       -MYOCARDITIS       -CHEST TRAUMA       -ARRYHTHMIAS       -LATE PRESENTING MYOCARDIAL INFARCTION       -COPD   CLINICAL FOLLOW-UP RECOMMENDED.   Glucose, capillary     Status: None   Collection  Time: 12/06/15  9:16 PM  Result Value Ref Range   Glucose-Capillary 76 65 - 99 mg/dL   Comment 1 Notify RN    Comment 2 Document in Chart   Troponin I     Status: Abnormal   Collection Time: 12/06/15  9:50 PM  Result Value Ref Range   Troponin I 0.05 (H) <0.031 ng/mL    Comment:        PERSISTENTLY INCREASED TROPONIN VALUES IN THE RANGE OF 0.04-0.49 ng/mL CAN BE SEEN IN:       -UNSTABLE ANGINA       -CONGESTIVE HEART FAILURE       -MYOCARDITIS       -CHEST TRAUMA       -ARRYHTHMIAS       -LATE PRESENTING MYOCARDIAL INFARCTION       -COPD   CLINICAL FOLLOW-UP RECOMMENDED.   PTH, intact and calcium     Status: Abnormal   Collection Time: 12/06/15  9:50 PM  Result Value Ref Range   PTH 239 (H) 15 - 65 pg/mL   Calcium, Total (PTH) 6.1 (LL) 8.6 - 10.2 mg/dL    Comment: **Verified by repeat analysis** CRITICAL RESULT CALLED TO, READ BACK BY AND VERIFIED WITH: T DABBS RN @  0308 ON 12/08/15 BY C DAVIS    PTH Comment     Comment: (NOTE) Interpretation                 Intact PTH    Calcium                                (pg/mL)      (mg/dL) Normal                          15 - 65     8.6 - 10.2 Primary Hyperparathyroidism         >65          >10.2 Secondary Hyperparathyroidism       >65          <10.2 Non-Parathyroid Hypercalcemia       <65          >10.2 Hypoparathyroidism                  <15          < 8.6 Non-Parathyroid Hypocalcemia    15 - 65          < 8.6 Performed At: [facility] [address] [place], [place] [id number] Lindon Romp MD HE:[id number]   VITAMIN D 25 Hydroxy (Vit-D Deficiency, Fractures)     Status: Abnormal   Collection Time: 12/06/15  9:50 PM  Result Value Ref Range   Vit D, 25-Hydroxy 6.5 (L) 30.0 - 100.0 ng/mL    Comment: (NOTE) Vitamin D deficiency has been defined by the Institute of Medicine and an Endocrine Society practice guideline as a level of serum 25-OH vitamin D less than 20 ng/mL (1,2). The Endocrine Society went on to  further define vitamin D insufficiency as a level between 21 and 29 ng/mL (2). 1. IOM (Institute of Medicine). 2010. Dietary reference   intakes for calcium and D. [place]: The   [employer]. 2. Holick MF, Binkley , Bischoff-Ferrari HA, et al.   Evaluation, treatment, and prevention of vitamin D   deficiency: an Endocrine Society clinical practice   guideline. JCEM. 2011 Jul; 96(7):1911-30. Test(s) Calcium, Serum called to Danny Lawless on 12/08/2015 at 02:15 EST Performed At: [facility] [place], [place] [id number] Lindon Romp MD MG:[id number]   Troponin I     Status: Abnormal   Collection Time: 12/07/15  2:00 AM  Result Value Ref Range   Troponin I 0.05 (H) <0.031 ng/mL    Comment:        PERSISTENTLY INCREASED TROPONIN VALUES IN THE RANGE OF 0.04-0.49 ng/mL CAN BE SEEN IN:       -UNSTABLE ANGINA       -CONGESTIVE HEART FAILURE       -MYOCARDITIS       -CHEST TRAUMA       -ARRYHTHMIAS       -LATE PRESENTING MYOCARDIAL INFARCTION       -COPD   CLINICAL FOLLOW-UP RECOMMENDED.   Glucose, capillary     Status: None   Collection Time: 12/07/15  4:56 AM  Result Value Ref Range   Glucose-Capillary 93 65 - 99 mg/dL   Comment 1 Notify RN   Glucose, capillary     Status: None   Collection Time: 12/07/15  9:03 AM  Result Value Ref Range   Glucose-Capillary 94 65 -  99 mg/dL  Comprehensive metabolic panel     Status: Abnormal   Collection Time: 12/07/15 10:10 AM  Result Value Ref Range   Sodium 142 135 - 145 mmol/L   Potassium 4.4 3.5 - 5.1 mmol/L   Chloride 117 (H) 101 - 111 mmol/L   CO2 13 (L) 22 - 32 mmol/L   Glucose, Bld 99 65 - 99 mg/dL   BUN 78 (H) 6 - 20 mg/dL   Creatinine, Ser 3.65 (H) 0.61 - 1.24 mg/dL   Calcium 6.2 (LL) 8.9 - 10.3 mg/dL    Comment: CRITICAL RESULT CALLED TO, READ BACK BY AND VERIFIED WITH: SEIGLA,J. RN AT 1132 12/07/15 Spindel,T    Total Protein 5.6 (L) 6.5 - 8.1 g/dL   Albumin 3.3 (L) 3.5 - 5.0  g/dL   AST 32 15 - 41 U/L   ALT 27 17 - 63 U/L   Alkaline Phosphatase 76 38 - 126 U/L   Total Bilirubin 0.6 0.3 - 1.2 mg/dL   GFR calc non Af Amer 16 (L) >60 mL/min   GFR calc Af Amer 18 (L) >60 mL/min    Comment: (NOTE) The eGFR has been calculated using the CKD EPI equation. This calculation has not been validated in all clinical situations. eGFR's persistently <60 mL/min signify possible Chronic Kidney Disease.    Anion gap 12 5 - 15  CK total and CKMB (cardiac)not at [facility]     Status: Abnormal   Collection Time: 12/07/15 10:10 AM  Result Value Ref Range   Total CK 102 49 - 397 U/L   CK, MB 10.5 (H) 0.5 - 5.0 ng/mL   Relative Index 10.3 (H) 0.0 - 2.5    Comment: Performed at [facility]  Glucose, capillary     Status: None   Collection Time: 12/07/15 11:45 AM  Result Value Ref Range   Glucose-Capillary 99 65 - 99 mg/dL  Procalcitonin - Baseline     Status: None   Collection Time: 12/07/15  2:57 PM  Result Value Ref Range   Procalcitonin 0.37 ng/mL    Comment:        Interpretation: PCT (Procalcitonin) <= 0.5 ng/mL: Systemic infection (sepsis) is not likely. Local bacterial infection is possible. (NOTE)         ICU PCT Algorithm               Non ICU PCT Algorithm    ----------------------------     ------------------------------         PCT < 0.25 ng/mL                 PCT < 0.1 ng/mL     Stopping of antibiotics            Stopping of antibiotics       strongly encouraged.               strongly encouraged.    ----------------------------     ------------------------------       PCT level decrease by               PCT < 0.25 ng/mL       >= 80% from peak PCT       OR PCT 0.25 - 0.5 ng/mL          Stopping of antibiotics  encouraged.     Stopping of antibiotics           encouraged.    ----------------------------     ------------------------------       PCT level decrease by              PCT >= 0.25 ng/mL       < 80%  from peak PCT        AND PCT >= 0.5 ng/mL            Continuin g antibiotics                                              encouraged.       Continuing antibiotics            encouraged.    ----------------------------     ------------------------------     PCT level increase compared          PCT > 0.5 ng/mL         with peak PCT AND          PCT >= 0.5 ng/mL             Escalation of antibiotics                                          strongly encouraged.      Escalation of antibiotics        strongly encouraged.   Glucose, capillary     Status: Abnormal   Collection Time: 12/07/15  5:39 PM  Result Value Ref Range   Glucose-Capillary 128 (H) 65 - 99 mg/dL  Influenza panel by PCR (type A & B, H1N1)     Status: None   Collection Time: 12/07/15  6:00 PM  Result Value Ref Range   Influenza A By PCR NEGATIVE NEGATIVE   Influenza B By PCR NEGATIVE NEGATIVE   H1N1 flu by pcr NOT DETECTED NOT DETECTED    Comment:        The Xpert Flu assay (FDA approved for nasal aspirates or washes and nasopharyngeal swab specimens), is intended as an aid in the diagnosis of influenza and should not be used as a sole basis for treatment. Performed at [facility]   Glucose, capillary     Status: Abnormal   Collection Time: 12/07/15  9:23 PM  Result Value Ref Range   Glucose-Capillary 143 (H) 65 - 99 mg/dL   Comment 1 Notify RN   Urinalysis, Routine w reflex microscopic (not at [name])     Status: Abnormal   Collection Time: 12/07/15 10:49 PM  Result Value Ref Range   Color, Urine YELLOW YELLOW   APPearance CLOUDY (A) CLEAR   Specific Gravity, Urine 1.017 1.005 - 1.030   pH 5.0 5.0 - 8.0   Glucose, UA NEGATIVE NEGATIVE mg/dL   Hgb urine dipstick NEGATIVE NEGATIVE   Bilirubin Urine NEGATIVE NEGATIVE   Ketones, ur NEGATIVE NEGATIVE mg/dL   Protein, ur NEGATIVE NEGATIVE mg/dL   Nitrite NEGATIVE NEGATIVE   Leukocytes, UA NEGATIVE NEGATIVE    Comment: MICROSCOPIC NOT DONE ON URINES WITH  NEGATIVE PROTEIN, BLOOD, LEUKOCYTES, NITRITE, OR GLUCOSE <1000 mg/dL.  Protein / creatinine ratio, urine     Status: Abnormal  Collection Time: 12/07/15 10:49 PM  Result Value Ref Range   Creatinine, Urine 78.89 mg/dL   Total Protein, Urine 29 mg/dL    Comment: NO NORMAL RANGE ESTABLISHED FOR THIS TEST   Protein Creatinine Ratio 0.37 (H) 0.00 - 0.15 mg/mg[Cre]    Comment: Performed at [facility]  Sodium, urine, random     Status: None   Collection Time: 12/07/15 10:49 PM  Result Value Ref Range   Sodium, Ur 31 mmol/L    Comment: Performed at [facility]  Creatinine, urine, random     Status: None   Collection Time: 12/07/15 10:49 PM  Result Value Ref Range   Creatinine, Urine 78.84 mg/dL    Comment: Performed at [facility]  Comprehensive metabolic panel     Status: Abnormal   Collection Time: 12/08/15  4:40 AM  Result Value Ref Range   Sodium 144 135 - 145 mmol/L   Potassium 4.6 3.5 - 5.1 mmol/L   Chloride 122 (H) 101 - 111 mmol/L   CO2 12 (L) 22 - 32 mmol/L   Glucose, Bld 186 (H) 65 - 99 mg/dL   BUN 75 (H) 6 - 20 mg/dL   Creatinine, Ser 3.22 (H) 0.61 - 1.24 mg/dL   Calcium 7.6 (L) 8.9 - 10.3 mg/dL   Total Protein 5.0 (L) 6.5 - 8.1 g/dL   Albumin 2.9 (L) 3.5 - 5.0 g/dL   AST 28 15 - 41 U/L   ALT 23 17 - 63 U/L   Alkaline Phosphatase 73 38 - 126 U/L   Total Bilirubin 0.4 0.3 - 1.2 mg/dL   GFR calc non Af Amer 18 (L) >60 mL/min   GFR calc Af Amer 21 (L) >60 mL/min    Comment: (NOTE) The eGFR has been calculated using the CKD EPI equation. This calculation has not been validated in all clinical situations. eGFR's persistently <60 mL/min signify possible Chronic Kidney Disease.    Anion gap 10 5 - 15  CBC     Status: Abnormal   Collection Time: 12/08/15  4:40 AM  Result Value Ref Range   WBC 5.7 4.0 - 10.5 K/uL   RBC 3.81 (L) 4.22 - 5.81 MIL/uL   Hemoglobin 11.4 (L) 13.0 - 17.0 g/dL   HCT 34.9 (L) 39.0 - 52.0 %   MCV 91.6 78.0 - 100.0 fL    MCH 29.9 26.0 - 34.0 pg   MCHC 32.7 30.0 - 36.0 g/dL   RDW 17.5 (H) 11.5 - 15.5 %   Platelets 114 (L) 150 - 400 K/uL    Comment: SPECIMEN CHECKED FOR CLOTS REPEATED TO VERIFY PLATELET COUNT CONFIRMED BY SMEAR   Magnesium     Status: None   Collection Time: 12/08/15  4:40 AM  Result Value Ref Range   Magnesium 1.9 1.7 - 2.4 mg/dL  Troponin I     Status: Abnormal   Collection Time: 12/08/15  4:40 AM  Result Value Ref Range   Troponin I 0.05 (H) <0.031 ng/mL    Comment:        PERSISTENTLY INCREASED TROPONIN VALUES IN THE RANGE OF 0.04-0.49 ng/mL CAN BE SEEN IN:       -UNSTABLE ANGINA       -CONGESTIVE HEART FAILURE       -MYOCARDITIS       -CHEST TRAUMA       -ARRYHTHMIAS       -LATE PRESENTING MYOCARDIAL INFARCTION       -COPD   CLINICAL FOLLOW-UP  RECOMMENDED.   Lactate dehydrogenase     Status: Abnormal   Collection Time: 12/08/15  4:40 AM  Result Value Ref Range   LDH 312 (H) 98 - 192 U/L  Glucose, capillary     Status: Abnormal   Collection Time: 12/08/15  8:15 AM  Result Value Ref Range   Glucose-Capillary 155 (H) 65 - 99 mg/dL   Comment 1 Notify RN   Glucose, capillary     Status: Abnormal   Collection Time: 12/08/15 12:05 PM  Result Value Ref Range   Glucose-Capillary 104 (H) 65 - 99 mg/dL   *Note: Due to a large number of results and/or encounters for the requested time period, some results have not been displayed. A complete set of results can be found in Results Review.    MICRO: No blood cx or respiratory cultures IMAGING: Dg Chest 2 View  12/08/2015  CLINICAL DATA:  69 year old male with pneumonia and shortness of breath. Recent bone marrow transplant. EXAM: CHEST  2 VIEW COMPARISON:  12/07/2015 and prior radiograph FINDINGS: The cardiomediastinal silhouette is unchanged. A right Port-A-Cath with tip overlying the superior cavoatrial junction again noted. Increasing right upper lung airspace disease/pneumonia is noted. Bilateral lower lung airspace disease  is unchanged. No large pleural effusions or pneumothorax noted. IMPRESSION: Increasing right upper lung airspace disease/pneumonia. Unchanged bilateral lower lung airspace disease. Electronically Signed   By: Margarette Canada M.D.   On: 12/08/2015 11:39   Dg Chest 2 View  12/07/2015  CLINICAL DATA:  Shortness of breath, pre VQ scan. EXAM: CHEST  2 VIEW COMPARISON:  Chest CT 12/06/2015 FINDINGS: Focal consolidation noted in the right lung base concerning for pneumonia, this is similar to prior CT. Minimal left basilar density, also similar. Heart is mildly enlarged. No effusions. No acute bony abnormality. IMPRESSION: Stable appearance of the bilateral lower lobe airspace opacities, right greater than left concerning for pneumonia. Electronically Signed   By: Rolm Baptise M.D.   On: 12/07/2015 12:25   Ct Chest Wo Contrast  12/06/2015  CLINICAL DATA:  Shortness of breath. Kidney failure. Hypocalcemia. Hypoglycemia. History of leukemia, status post bone marrow transplant in 2013. EXAM: CT CHEST WITHOUT CONTRAST TECHNIQUE: Multidetector CT imaging of the chest was performed following the standard protocol without IV contrast. COMPARISON:  02/19/2015 FINDINGS: Mediastinum/Nodes: The right-sided Port-A-Cath which terminates at the superior caval/ atrial junction. Tortuous descending thoracic aorta. Mild cardiomegaly. Multivessel coronary artery atherosclerosis. No mediastinal or definite hilar adenopathy, given limitations of unenhanced CT. Lungs/Pleura: Tiny right-sided pleural effusion. Right worse than left bibasilar and dependent upper lobe airspace and ground-glass opacity with mild septal thickening. Upper abdomen: Trace perihepatic ascites. Normal imaged portions of the spleen, stomach, adrenal glands, kidneys. The gallbladder is incompletely imaged but may be distended. This is similar to on the prior exam. No calcified stone. Subtle edema in the porta hepatis, including on image 56/series 2. Musculoskeletal:  Remote left-sided rib fractures. Osteopenia. Advanced thoracolumbar spondylosis. Mild superior endplate compression deformity at T4 is chronic. Interval healing of previously described T10 compression deformity. A moderate T12 compression deformity is not significantly changed. IMPRESSION: 1. Bilateral airspace and ground-glass opacities, favor infection. Given dependent distribution, aspiration could look similar. 2.  Atherosclerosis, including within the coronary arteries. 3. Cardiomegaly with small right pleural effusion. 4. Nonspecific gallbladder distension. Suggestion of edema in the porta hepatis. Correlate with upper abdominal symptoms. Consider correlation with pancreatic enzyme levels to exclude pancreatitis. Depending on symptomatology, abdominal ultrasound or CT may be informative. 5. Trace perihepatic  ascites. Electronically Signed   By: Abigail Miyamoto M.D.   On: 12/06/2015 18:46   Nm Pulmonary Perf And Vent  12/07/2015  CLINICAL DATA:  Shortness of breath for 1 and half weeks. Lower extremity swelling. EXAM: NUCLEAR MEDICINE VENTILATION - PERFUSION LUNG SCAN TECHNIQUE: Ventilation images were obtained in multiple projections using inhaled aerosol [id number]. Perfusion images were obtained in multiple projections after intravenous injection of Tc-926mAA. RADIOPHARMACEUTICALS:  31 millicurie [id number] aerosol inhalation and 4.3.43illicurie [id number] IV COMPARISON:  12/07/2015 FINDINGS: Ventilation: No focal ventilation defect. Central scratch set deposition of the radiopharmaceutical within the central airway is noted. Perfusion: No wedge shaped peripheral perfusion defects to suggest acute pulmonary embolism. IMPRESSION: Low probability for acute pulmonary embolus. Electronically Signed   By: TaKerby Moors.D.   On: 12/07/2015 15:38   [place]  12/06/2015  CLINICAL DATA:  Acute renal failure EXAM: RENAL / URINARY TRACT ULTRASOUND COMPLETE COMPARISON:  02/19/2015  FINDINGS: Right Kidney: Length: 11.6 cm. Echogenicity within normal limits. No mass or hydronephrosis visualized. Left Kidney: Length: 10.9 cm. Echogenicity within normal limits. No mass or hydronephrosis visualized. Bladder: Limit assessment.  Under distended.  Grossly unremarkable. IMPRESSION: No acute finding by renal ultrasound. Electronically Signed   By: M.Jerilynn Mages Shick M.D.   On: 12/06/2015 21:13   cxr on 12/17 shows worsening right upper lobe involvement  Assessment/Plan:  69yo with CLL, past hx of CMV reactivation, chronic GVHD admitted for shortness of breath found to have worsening bilateral infiltrate over a course of 3 days. Infiltrates could be infectious such as atypical infections with PCP, aspergillus, or CMV related or possibly a presentation of GVHD.  - would empirically start bactrim for PCP treatment, renally dosed. Would not like to do the alternative due to association of clindamycin with c.difficile  Recommend to get CMV serum viral load, beta-glucan, aspergillus ab.   Agree that bronchoscopy is needed for further specimen. Would defer to pulmonary to decide which testing is optimal to see if GVHD or PCP vs. CMV is related.   Tremors = wonder if it would be worth checking tacro level vs. Decreasing tacro dosing since he has had worsening kidney function and likely has higher lever now than at his baseline and could account for worsening tremors. Will defer to dr. MaJana Hakim ckd = appears slightly improved with diuresis. Will restart empiric treatment since cxr appears worse to see if any improvement in symptoms. May need to consider getting abg to assess degree of hypoxia, and warrant steroids.  [place]zie [place] Infectious Diseases [phone]

## 2015-12-08 NOTE — Progress Notes (Addendum)
[facility] Progress Note   Subjective: alert no complaints, some SOB but feels better than yesterday. UOP 1000 cc on po lasix 80 bid. Getting IVF 50 cc/ hr. Creat down 3.6 > 3.22  Filed Vitals:   12/07/15 1000 12/07/15 1538 12/07/15 2043 12/08/15 0542  BP:  131/74 130/70 146/83  Pulse:  77 74 71  Temp:  97.3 F (36.3 C) 97.7 F (36.5 C) 97.9 F (36.6 C)  TempSrc:  Oral Oral Oral  Resp:  '18 22 20  '$ Height: '5\' 6"'$  (1.676 m)     Weight: 97.977 kg (216 lb)     SpO2:  96% 98% 96%    Inpatient medications: . acyclovir  800 mg Oral BID  . budesonide  3 mg Oral TID  . calcium gluconate  2 g Intravenous Q8H  . cholestyramine  4 g Oral BID  . diltiazem  240 mg Oral Daily  . enoxaparin (LOVENOX) injection  30 mg Subcutaneous Q24H  . fluconazole  100 mg Oral Daily  . furosemide  80 mg Oral BID  . insulin aspart  0-20 Units Subcutaneous TID WC  . insulin aspart  6 Units Subcutaneous TID WC  . labetalol  400 mg Oral BID  . loratadine  10 mg Oral Daily  . pantoprazole  40 mg Oral Daily  . predniSONE  10 mg Oral Q breakfast  . sertraline  50 mg Oral q morning - 10a  . sodium bicarbonate  650 mg Oral Daily  . tacrolimus  1.5 mg Oral BID   . dextrose 5 % and 0.45% NaCl 50 mL/hr at 12/06/15 1734   HYDROcodone-acetaminophen, morphine injection, ondansetron, prochlorperazine, technetium TC 33M diethylenetriame-pentaacetic acid  Exam: Alert, no distress, mild ^WOB w talking No jvd flat neck veins Chest coarse bibasilar rales RRR no mrg Abd obese no ascites or abd wall edema Ext distal edema 2+ pretib / pedal, no UE edema Neuro Ox 3, nf, no asterixis  Date   Creat  eGFR  2008-2014  0.8- 1.1 2015   1.1-1.5   Jan - Jun 2016 1.3- 1.6 July   1.5 Aug   1.6- 1.9 Sept   1.7- 2.4 Oct   2.0- 2.6 Nov   2.3- 2.9 Dec 7   2.3 Dec 15  3.77  15    Dec 16  3.65 Dec 17  3.22  16   UA negative 12/16 CXR 12/16 focal R basilar densities, fluffy R perhilar infiltrates CT  chest focal nodular infiltrates R > L bases, R perihilar confluent density w air bronchograms Renal US no acute changes V/Q low prob ECHO mod-sev LVH, LVEF 45-50%, IVC normal size  Assessment: 1 Acute on CKD4- unclear cause. Renal function at baseline is not good, stage 4 CKD which has progressed developed since SCT in 2013.  There are reports of GVHD causing renal disease but not many. UA neg. Could have decomp CHF causing bump in creat. Creat improving w bedrest / usual po lasix dose. Vol excess on exam.  Will increase lasix slightly and dc IVF"s. Daily wts. Not on abx as no fever/ WBC.   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, skin / GI 6 Chron diarrhea 7 Chron immunosuppression (tac/ pred) 8 HTN dilt/ labetalol, controlled 9 Hypomagnesemia  Plan - dc 1/2 NS, change lasix 80 IV bid, daily creat, ^ bicarb po   Kelly Splinter MD [facility] pager [phone]    cell [phone] 12/08/2015, 9:29 AM    Recent Labs Lab 12/06/15 1440 12/06/15 2150 12/07/15 1010 12/08/15 0440  NA 141  --  142 144  K 4.8  --  4.4 4.6  CL 118*  --  117* 122*  CO2 12*  --  13* 12*  GLUCOSE 151*  --  99 186*  BUN 82*  --  78* 75*  CREATININE 3.77*  --  3.65* 3.22*  CALCIUM 5.8* 6.1* 6.2* 7.6*  PHOS 5.7*  --   --   --     Recent Labs Lab 12/06/15 1440 12/07/15 1010 12/08/15 0440  AST 35 32 28  ALT '29 27 23  '$ ALKPHOS 83 76 73  BILITOT 0.4 0.6 0.4  PROT 5.7* 5.6* 5.0*  ALBUMIN 3.5 3.3* 2.9*    Recent Labs Lab 12/06/15 1138 12/06/15 1440 12/08/15 0440  WBC 6.7 5.4 5.7  NEUTROABS 5.7 4.4  --   HGB 13.1 12.1* 11.4*  HCT 39.4 36.5* 34.9*  MCV 90.6 91.5 91.6  PLT 107* 120* 114*

## 2015-12-08 NOTE — Progress Notes (Signed)
ANTIBIOTIC CONSULT NOTE - INITIAL  Pharmacy Consult for Septra Indication: Pneumocystis Pneumonia  Allergies  Allergen Reactions  . Benadryl [Diphenhydramine Hcl]     "Restless leg syndrome"    Patient Measurements: Height: 5\' 6"  (167.6 cm) Weight: 216 lb (97.977 kg) IBW/kg (Calculated) : 63.8  Vital Signs: Temp: 97.9 F (36.6 C) (12/17 1257) Temp Source: Oral (12/17 1257) BP: 129/67 mmHg (12/17 1257) Pulse Rate: 72 (12/17 1257) Intake/Output from previous day: 12/16 0701 - 12/17 0700 In: -  Out: 1000 [Urine:1000] Intake/Output from this shift: Total I/O In: 720 [P.O.:720] Out: 400 [Urine:400]  Labs:  Recent Labs  12/06/15 1138  12/06/15 1440 12/07/15 1010 12/07/15 2249 12/08/15 0440  WBC 6.7  --  5.4  --   --  5.7  HGB 13.1  --  12.1*  --   --  11.4*  PLT 107*  --  120*  --   --  114*  LABCREA  --   --   --   --  78.89  78.84  --   CREATININE  --   < > 3.77* 3.65*  --  3.22*  < > = values in this interval not displayed. Estimated Creatinine Clearance: 23.7 mL/min (by C-G formula based on Cr of 3.22). No results for input(s): VANCOTROUGH, VANCOPEAK, VANCORANDOM, GENTTROUGH, GENTPEAK, GENTRANDOM, TOBRATROUGH, TOBRAPEAK, TOBRARND, AMIKACINPEAK, AMIKACINTROU, AMIKACIN in the last 72 hours.   Microbiology: Recent Results (from the past 720 hour(s))  C difficile quick scan w PCR reflex     Status: None   Collection Time: 12/08/15  3:02 PM  Result Value Ref Range Status   C Diff antigen NEGATIVE NEGATIVE Final   C Diff toxin NEGATIVE NEGATIVE Final   C Diff interpretation Negative for toxigenic C. difficile  Final    Medical History: Past Medical History  Diagnosis Date  . 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 Diverticular disease   . Hyperlipidemia   . Obesity   . Hypertension   . Hiatal hernia   . CMV (cytomegalovirus) antibody positive     pre-transplant, with seroconversion x2 pst-transplant  . Right bundle branch block     pre-transplant  . CKD  (chronic kidney disease) stage 2, GFR 60-89 ml/min   . Pancytopenia ([place])   . Atrial fibrillation ([place])     post-transplant  . Myopathy   . 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 Chronic GVHD complicating bone marrow transplantation ([place]) 12/05/2012  . Diarrhea in adult patient 12/05/2012    Due to active GVHD  . Rash of face 12/05/2012    Due to GVHD  . Hypomagnesemia 01/26/2013  . Left hip pain 12/01/2013  . Steroid-induced diabetes ([place])     novalog  . Leukemia, chronic lymphoid ([place])   . CLL (chronic lymphocytic leukemia) ([place]) 12/05/2012    Dx 07/1999; started Rx 12/06  AlloBMT 3/13    Assessment: 69yo M with CLL, past hx of CMV reactivation, chronic GVHD admitted for shortness of breath found to have worsening bilateral infiltrate over a course of 3 days. Infiltrates could be infectious such as atypical infections with PCP, aspergillus, or CMV related or possibly a presentation of GVHD.  ID following and recommending empirically starting Bactrim for PCP treatment, adjusted renally.  Noted that patient was on Bactrim 1 DS daily for prophylaxis PTA.  Today, 12/08/2015: - CXR =  Increasing right upper lung airspace disease/pneumonia.  Unchanged bilateral lower lung airspace disease. - WBC WNL - Afebrile  - SCr  3.22, CrCl~23 ml/min - Ensure patient is getting adequate hydration to prevent crystalluria.  Patient currently on bicarb gtt at 50 ml/hr. - Pt currently on acyclovir and fluconazole for prophylaxis, continued from PTA  Goal of Therapy:  Doses adjusted per renal function Eradication of infection  Plan:  1.  Septra 7.5 mg TMP/kg/day (50% of recommended dose for CrCl 15-30 ml/min) given in divided doses of every 12 hours.  Used adjusted body weight of 77.5 kg for this critically ill obese patient. 2.  F/u SCr, infectious work up, ID recommendations.  Ensure patient receiving adequate hydration while on IV Septra.  Hershal Coria 12/08/2015,4:23 PM

## 2015-12-09 LAB — GLUCOSE, CAPILLARY
GLUCOSE-CAPILLARY: 164 mg/dL — AB (ref 65–99)
GLUCOSE-CAPILLARY: 207 mg/dL — AB (ref 65–99)
Glucose-Capillary: 145 mg/dL — ABNORMAL HIGH (ref 65–99)
Glucose-Capillary: 184 mg/dL — ABNORMAL HIGH (ref 65–99)

## 2015-12-09 LAB — COMPREHENSIVE METABOLIC PANEL
ALT: 20 U/L (ref 17–63)
ANION GAP: 11 (ref 5–15)
AST: 22 U/L (ref 15–41)
Albumin: 3 g/dL — ABNORMAL LOW (ref 3.5–5.0)
Alkaline Phosphatase: 74 U/L (ref 38–126)
BILIRUBIN TOTAL: 0.4 mg/dL (ref 0.3–1.2)
BUN: 79 mg/dL — AB (ref 6–20)
CO2: 14 mmol/L — ABNORMAL LOW (ref 22–32)
Calcium: 7.4 mg/dL — ABNORMAL LOW (ref 8.9–10.3)
Chloride: 116 mmol/L — ABNORMAL HIGH (ref 101–111)
Creatinine, Ser: 3.09 mg/dL — ABNORMAL HIGH (ref 0.61–1.24)
GFR calc Af Amer: 22 mL/min — ABNORMAL LOW (ref >60)
GFR, EST NON AFRICAN AMERICAN: 19 mL/min — AB (ref >60)
Glucose, Bld: 161 mg/dL — ABNORMAL HIGH (ref 65–99)
POTASSIUM: 4.4 mmol/L (ref 3.5–5.1)
Sodium: 141 mmol/L (ref 135–145)
TOTAL PROTEIN: 5.4 g/dL — AB (ref 6.5–8.1)

## 2015-12-09 LAB — PROCALCITONIN: PROCALCITONIN: 0.26 ng/mL

## 2015-12-09 LAB — CBC
HCT: 35.5 % — ABNORMAL LOW (ref 39.0–52.0)
HEMOGLOBIN: 11.4 g/dL — AB (ref 13.0–17.0)
MCH: 29.2 pg (ref 26.0–34.0)
MCHC: 32.1 g/dL (ref 30.0–36.0)
MCV: 90.8 fL (ref 78.0–100.0)
PLATELETS: 129 10*3/uL — AB (ref 150–400)
RBC: 3.91 MIL/uL — AB (ref 4.22–5.81)
RDW: 17.4 % — ABNORMAL HIGH (ref 11.5–15.5)
WBC: 5 10*3/uL (ref 4.0–10.5)

## 2015-12-09 LAB — IGG, IGA, IGM
IGA: 5 mg/dL — AB (ref 61–437)
IGG (IMMUNOGLOBIN G), SERUM: 347 mg/dL — AB (ref 700–1600)
IgM, Serum: <5 mg/dL — ABNORMAL LOW (ref 20–172)

## 2015-12-09 LAB — CMV IGM: CMV IgM: <30 AU/mL (ref 0.0–29.9)

## 2015-12-09 LAB — UREA NITROGEN, URINE: Urea Nitrogen, Ur: 561 mg/dL

## 2015-12-09 LAB — CMV ANTIBODY, IGG (EIA): CMV AB - IGG: 1.3 U/mL — AB (ref 0.00–0.59)

## 2015-12-09 LAB — TACROLIMUS LEVEL: Tacrolimus (FK506) - LabCorp: 18.8 ng/mL (ref 2.0–20.0)

## 2015-12-09 MED ORDER — FUROSEMIDE 10 MG/ML IJ SOLN
160.0000 mg | Freq: Four times a day (QID) | INTRAMUSCULAR | Status: DC
  Filled 2015-12-09 (×2): qty 16

## 2015-12-09 MED ORDER — FUROSEMIDE 40 MG PO TABS
80.0000 mg | ORAL_TABLET | Freq: Two times a day (BID) | ORAL | Status: DC
  Administered 2015-12-09 – 2015-12-10 (×3): 80 mg via ORAL
  Filled 2015-12-09 (×2): qty 2

## 2015-12-09 NOTE — Progress Notes (Signed)
[facility] Progress Note   Subjective: creat down 3.09, CXR yest worsening perihilar air space disease R side. No new complaints per pt, no increase in SOB. Lying flat, no prod cough. New slight fever 98.9 this am  Filed Vitals:   12/08/15 0542 12/08/15 1257 12/08/15 2025 12/09/15 0538  BP: 146/83 129/67 156/83 145/80  Pulse: 71 72 76 69  Temp: 97.9 F (36.6 C) 97.9 F (36.6 C) 98.3 F (36.8 C) 98.9 F (37.2 C)  TempSrc: Oral Oral Oral Oral  Resp: _0 Height:      Weight:      SpO2: 96% 98% 97% 97%    Inpatient medications: . acyclovir  800 mg Oral BID  . budesonide  3 mg Oral TID  . calcium gluconate  2 g Intravenous Q12H  . cholestyramine  4 g Oral BID  . diltiazem  240 mg Oral Daily  . enoxaparin (LOVENOX) injection  30 mg Subcutaneous Q24H  . fluconazole  100 mg Oral Daily  . furosemide  80 mg Intravenous Q12H  . insulin aspart  0-20 Units Subcutaneous TID WC  . insulin aspart  6 Units Subcutaneous TID WC  . labetalol  400 mg Oral BID  . loratadine  10 mg Oral Daily  . pantoprazole  40 mg Oral Daily  . predniSONE  10 mg Oral Q breakfast  . sertraline  50 mg Oral q morning - 10a  . sodium bicarbonate  1,300 mg Oral BID  . sulfamethoxazole-trimethoprim  7.5 mg/kg/day (Adjusted) Intravenous Q12H  . tacrolimus  1.5 mg Oral BID   .  sodium bicarbonate  infusion 1000 mL 50 mL/hr at 12/08/15 1252   HYDROcodone-acetaminophen, hydrocortisone, morphine injection, ondansetron, prochlorperazine, technetium TC 40M diethylenetriame-pentaacetic acid  Exam: Alert, no distress, mild ^WOB w talking No jvd flat neck veins Chest coarse bibasilar rales RRR no mrg Abd obese no ascites or abd wall edema Ext distal edema 2+ pretib / pedal, no UE edema Neuro Ox 3, nf, no asterixis  UA negative 12/16 CXR 12/16 focal R basilar densities, fluffy R perhilar infiltrates CT chest focal nodular infiltrates R > L bases, R perihilar confluent density w air  bronchograms Renal US no acute changes V/Q low prob ECHO mod-sev LVH, LVEF 45-50%, IVC normal size  Assessment: 1 Acute on CKD4 - renal function at baseline is not good, stage 4 CKD which has developed since SCT in 2013.  There are reports of GVHD causing renal disease but not many. UA neg. Creat down 3.0 today, recent baseline 2.3 Nov-Dec.  2 SOB/ pulm infiltrates - worse on yesterday's film. Have d/w primary, concerns regarding poss non-bacterial infectious process. For bronch tomorrow. Less likely CHF w/o sig symptoms/ dyspnea given xray findings.   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 Will follow.    Kelly Splinter MD [facility] pager [phone]    cell [phone] 12/09/2015, 8:57 AM    Recent Labs Lab 12/06/15 1440  12/07/15 1010 12/08/15 0440 12/09/15 0510  NA 141  --  142 144 141  K 4.8  --  4.4 4.6 4.4  CL 118*  --  117* 122* 116*  CO2 12*  --  13* 12* 14*  GLUCOSE 151*  --  99 186* 161*  BUN 82*  --  78* 75* 79*  CREATININE 3.77*  --  3.65* 3.22* 3.09*  CALCIUM 5.8*  < > 6.2* 7.6* 7.4*  PHOS 5.7*  --   --   --   --   < > = values in this interval not displayed.  Recent Labs Lab 12/07/15 1010 12/08/15 0440 12/09/15 0510  AST 32 28 22  ALT _0 ALKPHOS 76 73 74  BILITOT 0.6 0.4 0.4  PROT 5.6* 5.0* 5.4*  ALBUMIN 3.3* 2.9* 3.0*    Recent Labs Lab 12/06/15 1138 12/06/15 1440 12/08/15 0440 12/09/15 0510  WBC 6.7 5.4 5.7 5.0  NEUTROABS 5.7 4.4  --   --   HGB 13.1 12.1* 11.4* 11.4*  HCT 39.4 36.5* 34.9* 35.5*  MCV 90.6 91.5 91.6 90.8  PLT 107* 120* 114* 129*

## 2015-12-09 NOTE — Progress Notes (Signed)
Timothy [name]   DOB:[birth year]   ZT#:[id number]   JAS#:[id number]  Subjective: CXR looks worse but Mosie says his breathing is better (!?); still has a nonproductive cough; SOB w activity; 2 BMs yesterday, one so far today, "more formed". Rectal discomfort-- not new. Wife not in room   Objective: middle aged White male examined in bed Filed Vitals:   12/08/15 2025 12/09/15 0538  BP: 156/83 145/80  Pulse: 76 69  Temp: 98.3 F (36.8 C) 98.9 F (37.2 C)  Resp: 18 20    Body mass index is 34.88 kg/(m^2).  Intake/Output Summary (Last 24 hours) at 12/09/15 0928 Last data filed at 12/09/15 0515  Gross per 24 hour  Intake    455 ml  Output    850 ml  Net   -395 ml     Sclerae unicteric  Oropharynx no thrush or other lesions  No peripheral adenopathy  Lungs show coarse wheeze bilaterally  Heart regular rate and rhythm  Abdomen soft, obese, NT, +BS  MSK bilat ankle edema as previously noted  Neuro nonfocal  Skin: purpura secondary to steroids, no rash  CBG (last 3)   Recent Labs  12/08/15 1205 12/08/15 1657 12/09/15 0755  GLUCAP 104* 171* 164*     Labs:  Lab Results  Component Value Date   WBC 5.0 12/09/2015   HGB 11.4* 12/09/2015   HCT 35.5* 12/09/2015   MCV 90.8 12/09/2015   PLT 129* 12/09/2015   NEUTROABS 4.4 12/06/2015    '@LASTCHEMISTRY'$ @  Urine Studies No results for input(s): UHGB, CRYS in the last 72 hours.  Invalid input(s): UACOL, UAPR, USPG, UPH, UTP, UGL, [place], UBIL, UNIT, UROB, [place], UEPI, [place], [place]  Basic Metabolic Panel:  Recent Labs Lab 12/06/15 1139 12/06/15 1139  12/06/15 1440 12/06/15 2150 12/07/15 1010 12/08/15 0440 12/09/15 0510  NA 140  --   --  141  --  142 144 141  K 5.0  --   < > 4.8  --  4.4 4.6 4.4  CL  --   --   --  118*  --  117* 122* 116*  CO2 10*  --   --  12*  --  13* 12* 14*  GLUCOSE 40*  --   --  151*  --  99 186* 161*  BUN 82.9*  --   --  82*  --  78* 75* 79*  CREATININE 3.7*  --   --   3.77*  --  3.65* 3.22* 3.09*  CALCIUM 6.1*  --   --  5.8* 6.1* 6.2* 7.6* 7.4*  MG  --  1.4*  --  1.3*  --   --  1.9  --   PHOS  --   --   --  5.7*  --   --   --   --   < > = values in this interval not displayed. GFR Estimated Creatinine Clearance: 24.7 mL/min (by C-G formula based on Cr of 3.09). Liver Function Tests:  Recent Labs Lab 12/06/15 1139 12/06/15 1440 12/07/15 1010 12/08/15 0440 12/09/15 0510  AST 35* 35 32 28 22  ALT 34 '29 27 23 20  '$ ALKPHOS 92 83 76 73 74  BILITOT 0.43 0.4 0.6 0.4 0.4  PROT 6.0* 5.7* 5.6* 5.0* 5.4*  ALBUMIN 3.6 3.5 3.3* 2.9* 3.0*   No results for input(s): LIPASE, AMYLASE in the last 168 hours. No results for input(s): AMMONIA in the last 168 hours. Coagulation profile No results  for input(s): INR, PROTIME in the last 168 hours.  CBC:  Recent Labs Lab 12/06/15 1138 12/06/15 1440 12/08/15 0440 12/09/15 0510  WBC 6.7 5.4 5.7 5.0  NEUTROABS 5.7 4.4  --   --   HGB 13.1 12.1* 11.4* 11.4*  HCT 39.4 36.5* 34.9* 35.5*  MCV 90.6 91.5 91.6 90.8  PLT 107* 120* 114* 129*   Cardiac Enzymes:  Recent Labs Lab 12/06/15 1440 12/06/15 2150 12/07/15 0200 12/07/15 1010 12/08/15 0440  CKTOTAL  --   --   --  102  --   CKMB  --   --   --  10.5*  --   TROPONINI 0.04* 0.05* 0.05*  --  0.05*   BNP: Invalid input(s): POCBNP CBG:  Recent Labs Lab 12/07/15 2123 12/08/15 0815 12/08/15 1205 12/08/15 1657 12/09/15 0755  GLUCAP 143* 155* 104* 171* 164*   D-Dimer No results for input(s): DDIMER in the last 72 hours. Hgb A1c No results for input(s): HGBA1C in the last 72 hours. Lipid Profile No results for input(s): CHOL, HDL, LDLCALC, TRIG, CHOLHDL, LDLDIRECT in the last 72 hours. Thyroid function studies  Recent Labs  12/06/15 1440  TSH 0.382   Anemia work up No results for input(s): VITAMINB12, FOLATE, FERRITIN, TIBC, IRON, RETICCTPCT in the last 72 hours. Microbiology Recent Results (from the past 240 hour(s))  C difficile quick  scan w PCR reflex     Status: None   Collection Time: 12/08/15  3:02 PM  Result Value Ref Range Status   C Diff antigen NEGATIVE NEGATIVE Final   C Diff toxin NEGATIVE NEGATIVE Final   C Diff interpretation Negative for toxigenic C. difficile  Final   Tacrolimus level 12/07/2015 pending   Studies:  Dg Chest 2 View  12/08/2015  CLINICAL DATA:  69 year old male with pneumonia and shortness of breath. Recent bone marrow transplant. EXAM: CHEST  2 VIEW COMPARISON:  12/07/2015 and prior radiograph FINDINGS: The cardiomediastinal silhouette is unchanged. A right Port-A-Cath with tip overlying the superior cavoatrial junction again noted. Increasing right upper lung airspace disease/pneumonia is noted. Bilateral lower lung airspace disease is unchanged. No large pleural effusions or pneumothorax noted. IMPRESSION: Increasing right upper lung airspace disease/pneumonia. Unchanged bilateral lower lung airspace disease. Electronically Signed   By: Margarette Canada M.D.   On: 12/08/2015 11:39   Dg Chest 2 View  12/07/2015  CLINICAL DATA:  Shortness of breath, pre VQ scan. EXAM: CHEST  2 VIEW COMPARISON:  Chest CT 12/06/2015 FINDINGS: Focal consolidation noted in the right lung base concerning for pneumonia, this is similar to prior CT. Minimal left basilar density, also similar. Heart is mildly enlarged. No effusions. No acute bony abnormality. IMPRESSION: Stable appearance of the bilateral lower lobe airspace opacities, right greater than left concerning for pneumonia. Electronically Signed   By: Rolm Baptise M.D.   On: 12/07/2015 12:25   Nm Pulmonary Perf And Vent  12/07/2015  CLINICAL DATA:  Shortness of breath for 1 and half weeks. Lower extremity swelling. EXAM: NUCLEAR MEDICINE VENTILATION - PERFUSION LUNG SCAN TECHNIQUE: Ventilation images were obtained in multiple projections using inhaled aerosol Tc-25mDTPA. Perfusion images were obtained in multiple projections after intravenous injection of Tc-921mMAA. RADIOPHARMACEUTICALS:  31 millicurie [id number] aerosol inhalation and 4.1.88illicurie [id number] IV COMPARISON:  12/07/2015 FINDINGS: Ventilation: No focal ventilation defect. Central scratch set deposition of the radiopharmaceutical within the central airway is noted. Perfusion: No wedge shaped peripheral perfusion defects to suggest acute pulmonary embolism. IMPRESSION: Low probability  for acute pulmonary embolus. Electronically Signed   By: Kerby Moors M.D.   On: 12/07/2015 15:38    Assessment: 69 y.o. 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 His CLL history is as follows:  (1) fludarabine/cyclophosphamide/rituximab x5 completed May 2007.   (2) rituximab for 8 doses October 2010, with partial response   (3) Leustatin and ofatumumab weekly x8 July to September 2011 followed by maintenance ofatumumab every 2 months, with initial response but rising counts September 2012   (4) status-post unrelated donor stem-cell transplant 02/24/2012 at [facility]  (a) conditioning regimen consisted of fludarabine + TBI at 200 cGy, followed by rituximab x27;  (b) CMV reactivation x3 (patient CMV positive, donor negative), s/p ganciclovir treatment; 3d reactivation August 2013, s/p gancyclovir, with negative PCR mid-September 2013; last gancyclovir dose 10/06/2012 (c) Chronic GVHD: involving gut and skin, treated with steroids, tacrolimus and MMF. MMF was eventually d/c'd and tacrolimus currently at a dose of 1.'5mg'$  BID (d) atrial fibrillation: resolved on brief amiodarone regimen (e) steroid-induced myopathy: improving  (f) hypomagnesemia: improved after d/c gancyclovir, needs  continuing support (g) hypogammaglobulinemia: requiring IVIG most recently 08/03/2014. (h) history of elevated triglycerides (606 on 07/14/2012)  (i) adrenal insufficiency: on prednisone and budesonide (j) pancytopenia,resolved (k) brief episode of neutropenia ([place] [facility]) February 2015, accompanied by diarrhea; resolved   (5) restaging studies February-March 2015 including CT scans, flow cytometry, and bone marrow biopsy, showed no evidence of residual chronic lymphoid leukemia. (a) repeat bone marrow biopsy 02/27/2015 showed no evidence of chronic lymphoid leukemia and also no dyspoiesis. Flow cytometry showed no B cells.  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 Status post kyphoplasty at L3-4 in June 2014. Also with evidence of rib fractures and insufficiency fractures bilaterally of the sacral alae, noted by CT in March 2015. -- Denosumab started 12/08/2013, given as prolia Q6 months which is what has been approved by his insurance, most recent dose 03/29/2015  (12) chronic back pain and hip pain controlled with OxyContin and hydrocodone/APAP.  (12) nausea: well controlled on current meds  (13) Positive c.diff, 03/08/2013, on Flagyl 500 mg TID x 20 days, then on oral vanco with Questran, showing improvement; positive when repeated April 2014; Negative x 3 since then; repeat 12/07/2015 again negative  (14) persistently increased BUN and potassium: Improved on daily  bicarbonate  (15) Hypertension, on labetalol, cardizem, lisinopril, and furosemide; managed by Dr. 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  Given the lack of rash, mucositis, or worsening diarrhea, I don't think we are dealing with worsening GVHD.  We have a tacrolimus level pending. When I have tried to lower dose in past result has been immediate flare of GVHD. While given rise in creatinine some dose adjustment is likely to be necessary, I will wait on the level before changing dose. In any case, lung problem preceded the renal issue, so drug toxicity is unlikely to be the cause.  In short I think we are likely dealing with an infectious cause of his lung infiltrates. Agree with renal's plan to diurese given the CXR yesterday. Await result of studies from BAL planned for tomorrow. Will consider steroids post procedure.  Discussed with patinet.  Chauncey Cruel, MD 12/09/2015  9:28 AM Medical Oncology and Hematology [facility] [address] [place], [place] [address] Tel. [phone]    Fax. [phone]

## 2015-12-10 ENCOUNTER — Inpatient Hospital Stay (HOSPITAL_COMMUNITY): Payer: BC Managed Care – PPO

## 2015-12-10 ENCOUNTER — Encounter (HOSPITAL_COMMUNITY): Payer: Self-pay | Admitting: Respiratory Therapy

## 2015-12-10 ENCOUNTER — Encounter (HOSPITAL_COMMUNITY)
Admission: AD | Disposition: A | Discharge status: Home Health | Payer: Self-pay | Source: Ambulatory Visit | Attending: Oncology

## 2015-12-10 DIAGNOSIS — J849 Interstitial pulmonary disease, unspecified: Secondary | ICD-10-CM

## 2015-12-10 DIAGNOSIS — R918 Other nonspecific abnormal finding of lung field: Secondary | ICD-10-CM

## 2015-12-10 DIAGNOSIS — N189 Chronic kidney disease, unspecified: Secondary | ICD-10-CM

## 2015-12-10 HISTORY — PX: VIDEO BRONCHOSCOPY: SHX5072

## 2015-12-10 LAB — COMPREHENSIVE METABOLIC PANEL
ALBUMIN: 2.9 g/dL — AB (ref 3.5–5.0)
ALT: 19 U/L (ref 17–63)
ANION GAP: 8 (ref 5–15)
AST: 22 U/L (ref 15–41)
Alkaline Phosphatase: 81 U/L (ref 38–126)
BUN: 83 mg/dL — ABNORMAL HIGH (ref 6–20)
CO2: 18 mmol/L — AB (ref 22–32)
Calcium: 7.7 mg/dL — ABNORMAL LOW (ref 8.9–10.3)
Chloride: 115 mmol/L — ABNORMAL HIGH (ref 101–111)
Creatinine, Ser: 3.17 mg/dL — ABNORMAL HIGH (ref 0.61–1.24)
GFR calc Af Amer: 21 mL/min — ABNORMAL LOW (ref >60)
GFR calc non Af Amer: 19 mL/min — ABNORMAL LOW (ref >60)
GLUCOSE: 181 mg/dL — AB (ref 65–99)
POTASSIUM: 4.5 mmol/L (ref 3.5–5.1)
SODIUM: 141 mmol/L (ref 135–145)
TOTAL PROTEIN: 5.5 g/dL — AB (ref 6.5–8.1)
Total Bilirubin: 0.4 mg/dL (ref 0.3–1.2)

## 2015-12-10 LAB — BODY FLUID CELL COUNT WITH DIFFERENTIAL
Eos, Fluid: 0 %
Lymphs, Fluid: 5 %
Monocyte-Macrophage-Serous Fluid: 42 % — ABNORMAL LOW (ref 50–90)
Neutrophil Count, Fluid: 53 % — ABNORMAL HIGH (ref 0–25)
Total Nucleated Cell Count, Fluid: 568 cu mm (ref 0–1000)

## 2015-12-10 LAB — GLUCOSE, CAPILLARY
Glucose-Capillary: 153 mg/dL — ABNORMAL HIGH (ref 65–99)
Glucose-Capillary: 82 mg/dL (ref 65–99)
Glucose-Capillary: 85 mg/dL (ref 65–99)
Glucose-Capillary: 88 mg/dL (ref 65–99)

## 2015-12-10 LAB — CBC
HEMATOCRIT: 35.1 % — AB (ref 39.0–52.0)
HEMOGLOBIN: 11.8 g/dL — AB (ref 13.0–17.0)
MCH: 30.3 pg (ref 26.0–34.0)
MCHC: 33.6 g/dL (ref 30.0–36.0)
MCV: 90.2 fL (ref 78.0–100.0)
Platelets: 118 10*3/uL — ABNORMAL LOW (ref 150–400)
RBC: 3.89 MIL/uL — ABNORMAL LOW (ref 4.22–5.81)
RDW: 17 % — ABNORMAL HIGH (ref 11.5–15.5)
WBC: 5.7 10*3/uL (ref 4.0–10.5)

## 2015-12-10 LAB — APTT: aPTT: 34 seconds (ref 24–37)

## 2015-12-10 LAB — PROTIME-INR
INR: 1.21 (ref 0.00–1.49)
Prothrombin Time: 15.5 seconds — ABNORMAL HIGH (ref 11.6–15.2)

## 2015-12-10 LAB — CALCIUM, IONIZED: CALCIUM, IONIZED, SERUM: 3.4 mg/dL — AB (ref 4.5–5.6)

## 2015-12-10 SURGERY — BRONCHOSCOPY, WITH FLUOROSCOPY
Anesthesia: Moderate Sedation | Laterality: Bilateral

## 2015-12-10 MED ORDER — SODIUM CHLORIDE 0.9 % IV SOLN
Freq: Once | INTRAVENOUS | Status: AC
  Administered 2015-12-26: 20 mL/h via INTRAVENOUS

## 2015-12-10 MED ORDER — MIDAZOLAM HCL 5 MG/ML IJ SOLN
INTRAMUSCULAR | Status: AC
  Filled 2015-12-10: qty 2

## 2015-12-10 MED ORDER — MENTHOL 3 MG MT LOZG
1.0000 | LOZENGE | OROMUCOSAL | Status: DC | PRN
Start: 2015-12-10 — End: 2015-12-28
  Administered 2015-12-14: 3 mg via ORAL
  Filled 2015-12-10 (×3): qty 9

## 2015-12-10 MED ORDER — LIDOCAINE HCL 1 % IJ SOLN
INTRAMUSCULAR | Status: DC | PRN
  Administered 2015-12-10: 6 mL via RESPIRATORY_TRACT

## 2015-12-10 MED ORDER — FENTANYL CITRATE (PF) 100 MCG/2ML IJ SOLN
INTRAMUSCULAR | Status: AC
  Filled 2015-12-10: qty 4

## 2015-12-10 MED ORDER — MIDAZOLAM HCL 10 MG/2ML IJ SOLN
INTRAMUSCULAR | Status: DC | PRN
  Administered 2015-12-10 (×4): 1 mg via INTRAVENOUS

## 2015-12-10 MED ORDER — METHYLPREDNISOLONE SODIUM SUCC 125 MG IJ SOLR
60.0000 mg | Freq: Four times a day (QID) | INTRAMUSCULAR | Status: DC
  Administered 2015-12-10 – 2015-12-14 (×15): 60 mg via INTRAVENOUS
  Filled 2015-12-10 (×15): qty 2

## 2015-12-10 MED ORDER — FENTANYL CITRATE (PF) 100 MCG/2ML IJ SOLN
INTRAMUSCULAR | Status: DC | PRN
  Administered 2015-12-10 (×3): 25 ug via INTRAVENOUS

## 2015-12-10 MED ORDER — FUROSEMIDE 40 MG PO TABS
160.0000 mg | ORAL_TABLET | Freq: Three times a day (TID) | ORAL | Status: DC
  Administered 2015-12-10 – 2015-12-11 (×6): 160 mg via ORAL
  Filled 2015-12-10 (×6): qty 4

## 2015-12-10 MED ORDER — BUTAMBEN-TETRACAINE-BENZOCAINE 2-2-14 % EX AERO: 1.0000 | INHALATION_SPRAY | Freq: Once | CUTANEOUS | Status: DC

## 2015-12-10 MED ORDER — PHENYLEPHRINE HCL 0.25 % NA SOLN
NASAL | Status: DC | PRN
  Administered 2015-12-10: 2 via NASAL

## 2015-12-10 MED ORDER — FUROSEMIDE 40 MG PO TABS: 160.0000 mg | ORAL_TABLET | Freq: Two times a day (BID) | ORAL | Status: DC

## 2015-12-10 MED ORDER — SODIUM CHLORIDE 0.9 % IV SOLN
INTRAVENOUS | Status: DC
  Administered 2015-12-10: 09:00:00 via INTRAVENOUS

## 2015-12-10 MED ORDER — LORAZEPAM 2 MG/ML IJ SOLN
0.5000 mg | Freq: Every evening | INTRAMUSCULAR | Status: DC | PRN
  Administered 2015-12-12 – 2015-12-15 (×2): 0.5 mg via INTRAVENOUS
  Filled 2015-12-10 (×2): qty 1

## 2015-12-10 MED ORDER — TACROLIMUS 1 MG PO CAPS
1.0000 mg | ORAL_CAPSULE | Freq: Two times a day (BID) | ORAL | Status: DC
  Administered 2015-12-10 – 2015-12-17 (×13): 1 mg via ORAL
  Filled 2015-12-10 (×19): qty 1

## 2015-12-10 MED ORDER — SODIUM BICARBONATE 650 MG PO TABS
1300.0000 mg | ORAL_TABLET | Freq: Three times a day (TID) | ORAL | Status: DC
  Administered 2015-12-10 – 2015-12-17 (×20): 1300 mg via ORAL
  Filled 2015-12-10 (×20): qty 2

## 2015-12-10 MED ORDER — PHENYLEPHRINE HCL 0.25 % NA SOLN: 1.0000 | Freq: Four times a day (QID) | NASAL | Status: DC | PRN

## 2015-12-10 MED ORDER — LIDOCAINE HCL 2 % EX GEL
CUTANEOUS | Status: DC | PRN
Start: 2015-12-10 — End: 2015-12-10
  Administered 2015-12-10: 1

## 2015-12-10 MED ORDER — LIDOCAINE HCL 2 % EX GEL: 1.0000 "application " | Freq: Once | CUTANEOUS | Status: DC

## 2015-12-10 NOTE — Progress Notes (Signed)
Video Bronchoscopy done  Intervention Bronchial washing done Intervention Bronchial Biopsy done  X 2 sites Intervention Bronchial  Brushings done  Procedure  Tolerated well

## 2015-12-10 NOTE — Progress Notes (Signed)
    [facility]   Reason for visit: Follow up on hypoxia  Interval History: he had FOB this am, has remained afebrile, tachypneic, hypoxic.    Physical Exam: Constitutional:  Filed Vitals:   12/10/15 1040 12/10/15 1045  BP: 178/100 182/104  Pulse:    Temp:    Resp: 22 18   patient appears in NAD Respiratory: increased respiratory effort; CTA B Cardiovascular: RRR  Review of Systems: Constitutional: negative for fevers and chills Musculoskeletal: negative for myalgias and arthralgias  Lab Results  Component Value Date   WBC 5.7 12/10/2015   HGB 11.8* 12/10/2015   HCT 35.1* 12/10/2015   MCV 90.2 12/10/2015   PLT 118* 12/10/2015    Lab Results  Component Value Date   CREATININE 3.17* 12/10/2015   BUN 83* 12/10/2015   NA 141 12/10/2015   K 4.5 12/10/2015   CL 115* 12/10/2015   CO2 18* 12/10/2015    Lab Results  Component Value Date   ALT 19 12/10/2015   AST 22 12/10/2015   ALKPHOS 81 12/10/2015     Microbiology: Recent Results (from the past 240 hour(s))  C difficile quick scan w PCR reflex     Status: None   Collection Time: 12/08/15  3:02 PM  Result Value Ref Range Status   C Diff antigen NEGATIVE NEGATIVE Final   C Diff toxin NEGATIVE NEGATIVE Final   C Diff interpretation Negative for toxigenic C. difficile  Final    Impression/Plan:  1. Airspace disease - differential broad with immunosuppression.  Stable and will wait for any growth on specimen, DFA.   2.  CKD - slight worsening creat today.  On bactrim for possible PCP which is high on the differential and I agree with, despite renal insuficiency.  Hopefully with pulmoannry sample we will have something to target.

## 2015-12-10 NOTE — Progress Notes (Signed)
Sedema ubjective: Interval History: has complaints Still SOB.  Objective: Vital signs in last 24 hours: Temp:  [97.4 F (36.3 C)-98.4 F (36.9 C)] 98.4 F (36.9 C) (12/19 0817) Pulse Rate:  [74-76] 74 (12/19 0614) Resp:  [12-26] 18 (12/19 1045) BP: (124-197)/(76-105) 182/104 mmHg (12/19 1045) SpO2:  [89 %-98 %] 91 % (12/19 1045) Weight change:   Intake/Output from previous day: 12/18 0701 - 12/19 0700 In: 788.2 [I.V.:400; IV Piggyback:388.2] Out: 200 [Urine:200] Intake/Output this shift:    General appearance: alert, cooperative, mild distress and SOB Resp: diminished breath sounds bilaterally and rales bibasilar Cardio: S1, S2 normal and systolic murmur: holosystolic 2/6, blowing at apex GI: protub, pos bs, soft Extremitie1-2+edema bruises  Lab Results:  Recent Labs  12/09/15 0510 12/10/15 0430  WBC 5.0 5.7  HGB 11.4* 11.8*  HCT 35.5* 35.1*  PLT 129* 118*   BMET:  Recent Labs  12/09/15 0510 12/10/15 0430  NA 141 141  K 4.4 4.5  CL 116* 115*  CO2 14* 18*  GLUCOSE 161* 181*  BUN 79* 83*  CREATININE 3.09* 3.17*  CALCIUM 7.4* 7.7*   No results for input(s): PTH in the last 72 hours. Iron Studies: No results for input(s): IRON, TIBC, TRANSFERRIN, FERRITIN in the last 72 hours.  Studies/Results: Dg Chest Port 1 View  12/10/2015  CLINICAL DATA:  69 year old male post bronchoscopy right middle lobe. Subsequent encounter. EXAM: PORTABLE CHEST 1 VIEW COMPARISON:  12/08/2015. FINDINGS: Curvilinear structure right lung apex probably pleural reflection associated with rib rather than tiny pneumothorax. Attention to this on follow up. Asymmetric airspace disease greater on the right. When compared to the most recent chest x-ray, decrease in degree of consolidation right lung apex and slight increase in left perihilar consolidation. Pulmonary vascular congestion. Right MediPort catheter tip mid superior vena cava level. Heart size top-normal to slightly enlarged.  IMPRESSION: Curvilinear structure right lung apex probably pleural reflection associated with rib rather than tiny pneumothorax. Attention to this on follow up. Asymmetric airspace disease greater on the right. When compared to the most recent chest x-ray, decrease in degree of consolidation right lung apex and slight increase in left perihilar consolidation. Pulmonary vascular congestion. Electronically Signed   By: Genia Del M.D.   On: 12/10/2015 10:26   Dg C-arm Bronchoscopy  12/10/2015  CLINICAL DATA:  C-ARM BRONCHOSCOPY Fluoroscopy was utilized by the requesting physician.  No radiographic interpretation.    I have reviewed the patient's current medications.  Assessment/Plan: 1 CKD 3-4 and AKI vol xs, need to limit fluids, diurese.  Suspect dyspnea multifactorial 2 Pulm infilt fluid, ? Infx, ? GVH 3 Anemia 4 Obesity 5 Tremor  ? Level of Prograf, needs trough.  Dilt ^ prograf. P Trough prograf, ^lasix, ^ bicarb    LOS: 4 days   Timothy [name] L 12/10/2015,11:29 AM

## 2015-12-10 NOTE — Progress Notes (Signed)
Timothy [name]   DOB:1946-01-07   XY#:[id number]   QKM#:[id number]  Subjective: "rough day," did OK w bronch, other than sore throat, but is more SOB now; part of that may be he is late taking his meds. Not eating much. No BM past 18 hours. Wife not in room  Objective: middle aged White male examined in [place]:   12/10/15 1040 12/10/15 1045  BP: 178/100 182/104  Pulse:    Temp:    Resp: 22 18    Body mass index is 35.28 kg/(m^2).  Intake/Output Summary (Last 24 hours) at 12/10/15 1805 Last data filed at 12/10/15 1521  Gross per 24 hour  Intake 454.83 ml  Output      0 ml  Net 454.83 ml     Sclerae unicteric  Oropharynx no thrush, no lesons, slightly dry  Lungs no wheezes  Heart regular rate and rhythm  Abdomen soft, obese, NT, +BS  MSK bilat ankle edema, unchanged  Neuro nonfocal  Skin: purpura secondary to steroids as previously noted  CBG (last 3)   Recent Labs  12/10/15 0746 12/10/15 1309 12/10/15 1637  GLUCAP 153* 82 85     Labs:  Lab Results  Component Value Date   WBC 5.7 12/10/2015   HGB 11.8* 12/10/2015   HCT 35.1* 12/10/2015   MCV 90.2 12/10/2015   PLT 118* 12/10/2015   NEUTROABS 4.4 12/06/2015    _0 @  Urine Studies No results for input(s): UHGB, CRYS in the last 72 hours.  Invalid input(s): UACOL, UAPR, USPG, UPH, UTP, UGL, [place], UBIL, UNIT, UROB, [place], UEPI, UWBC, [place], [place]  Basic Metabolic Panel:  Recent Labs Lab 12/06/15 1139  12/06/15 1440 12/06/15 2150 12/07/15 1010 12/08/15 0440 12/09/15 0510 12/10/15 0430  NA  --   --  141  --  142 144 141 141  K  --   < > 4.8  --  4.4 4.6 4.4 4.5  CL  --   --  118*  --  117* 122* 116* 115*  CO2  --   --  12*  --  13* 12* 14* 18*  GLUCOSE  --   --  151*  --  99 186* 161* 181*  BUN  --   --  82*  --  78* 75* 79* 83*  CREATININE  --   --  3.77*  --  3.65* 3.22* 3.09* 3.17*  CALCIUM  --   --  5.8* 6.1* 6.2* 7.6* 7.4* 7.7*  MG 1.4*  --  1.3*  --    --  1.9  --   --   PHOS  --   --  5.7*  --   --   --   --   --   < > = values in this interval not displayed. GFR Estimated Creatinine Clearance: 24.2 mL/min (by C-G formula based on Cr of 3.17). Liver Function Tests:  Recent Labs Lab 12/06/15 1440 12/07/15 1010 12/08/15 0440 12/09/15 0510 12/10/15 0430  AST 35 32 _1 ALT _2 ALKPHOS 83 76 73 74 81  BILITOT 0.4 0.6 0.4 0.4 0.4  PROT 5.7* 5.6* 5.0* 5.4* 5.5*  ALBUMIN 3.5 3.3* 2.9* 3.0* 2.9*   No results for input(s): LIPASE, AMYLASE in the last 168 hours. No results for input(s): AMMONIA in the last 168 hours. Coagulation profile  Recent Labs Lab 12/10/15 0745  INR 1.21    CBC:  Recent Labs Lab  12/06/15 1138 12/06/15 1440 12/08/15 0440 12/09/15 0510 12/10/15 0430  WBC 6.7 5.4 5.7 5.0 5.7  NEUTROABS 5.7 4.4  --   --   --   HGB 13.1 12.1* 11.4* 11.4* 11.8*  HCT 39.4 36.5* 34.9* 35.5* 35.1*  MCV 90.6 91.5 91.6 90.8 90.2  PLT 107* 120* 114* 129* 118*   Cardiac Enzymes:  Recent Labs Lab 12/06/15 1440 12/06/15 2150 12/07/15 0200 12/07/15 1010 12/08/15 0440  CKTOTAL  --   --   --  102  --   CKMB  --   --   --  10.5*  --   TROPONINI 0.04* 0.05* 0.05*  --  0.05*   BNP: Invalid input(s): POCBNP CBG:  Recent Labs Lab 12/09/15 1739 12/09/15 2135 12/10/15 0746 12/10/15 1309 12/10/15 1637  GLUCAP 184* 207* 153* 82 85   D-Dimer No results for input(s): DDIMER in the last 72 hours. Hgb A1c No results for input(s): HGBA1C in the last 72 hours. Lipid Profile No results for input(s): CHOL, HDL, LDLCALC, TRIG, CHOLHDL, LDLDIRECT in the last 72 hours. Thyroid function studies No results for input(s): TSH, T4TOTAL, T3FREE, THYROIDAB in the last 72 hours.  Invalid input(s): FREET3 Anemia work up No results for input(s): VITAMINB12, FOLATE, FERRITIN, TIBC, IRON, RETICCTPCT in the last 72 hours. Microbiology Recent Results (from the past 240 hour(s))  C difficile quick scan w PCR reflex      Status: None   Collection Time: 12/08/15  3:02 PM  Result Value Ref Range Status   C Diff antigen NEGATIVE NEGATIVE Final   C Diff toxin NEGATIVE NEGATIVE Final   C Diff interpretation Negative for toxigenic C. difficile  Final   Tacrolimus level 12/07/2015 pending   Studies:  Dg Chest Port 1 View  12/10/2015  CLINICAL DATA:  69 year old male post bronchoscopy right middle lobe. Subsequent encounter. EXAM: PORTABLE CHEST 1 VIEW COMPARISON:  12/08/2015. FINDINGS: Curvilinear structure right lung apex probably pleural reflection associated with rib rather than tiny pneumothorax. Attention to this on follow up. Asymmetric airspace disease greater on the right. When compared to the most recent chest x-ray, decrease in degree of consolidation right lung apex and slight increase in left perihilar consolidation. Pulmonary vascular congestion. Right MediPort catheter tip mid superior vena cava level. Heart size top-normal to slightly enlarged. IMPRESSION: Curvilinear structure right lung apex probably pleural reflection associated with rib rather than tiny pneumothorax. Attention to this on follow up. Asymmetric airspace disease greater on the right. When compared to the most recent chest x-ray, decrease in degree of consolidation right lung apex and slight increase in left perihilar consolidation. Pulmonary vascular congestion. Electronically Signed   By: Genia Del M.D.   On: 12/10/2015 10:26   Dg C-arm Bronchoscopy  12/10/2015  CLINICAL DATA:  C-ARM BRONCHOSCOPY Fluoroscopy was utilized by the requesting physician.  No radiographic interpretation.    Assessment: 69 y.o. [place] man with a history of well-differentiated lymphocytic lymphoma/ chronic lymphoid leukemia initially diagnosed in 2000, not requiring intervention until 2006; s/p allogeneic transplant March 2013, now admitted with worsening SOB, bilateral pulmonary infiltrates and acute on chronic kidney injury, but no fever, mucositis,  or rash. His CLL history is as follows:  (1) fludarabine/cyclophosphamide/rituximab x5 completed May 2007.   (2) rituximab for 8 doses October 2010, with partial response   (3) Leustatin and ofatumumab weekly x8 July to September 2011 followed by maintenance ofatumumab every 2 months, with initial response but rising counts September 2012   (4) status-post unrelated donor  stem-cell transplant 02/24/2012 at the [facility]  (a) conditioning regimen consisted of fludarabine + TBI at 200 cGy, followed by rituximab x27;  (b) CMV reactivation x3 (patient CMV positive, donor negative), s/p ganciclovir treatment; 3d reactivation August 2013, s/p gancyclovir, with negative PCR mid-September 2013; last gancyclovir dose 10/06/2012 (c) Chronic GVHD: involving gut and skin, treated with steroids, tacrolimus and MMF. MMF was eventually d/c'd and tacrolimus currently at a dose of 1.41m BID (d) atrial fibrillation: resolved on brief amiodarone regimen (e) steroid-induced myopathy: improving  (f) hypomagnesemia: improved after d/c gancyclovir, needs continuing support (g) hypogammaglobulinemia: requiring IVIG most recently 08/03/2014. (h) history of elevated triglycerides (606 on 07/14/2012)  (i) adrenal insufficiency: on prednisone and budesonide (j) pancytopenia,resolved (k) brief episode of neutropenia ([id number]) February 2015, accompanied by diarrhea; resolved   (5) restaging studies February-March 2015 including CT scans, flow cytometry, and bone marrow biopsy, showed no evidence of residual chronic lymphoid leukemia. (a) repeat bone marrow biopsy 02/27/2015 showed no evidence of chronic lymphoid leukemia and also no dyspoiesis. Flow cytometry showed no B cells.  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 Status post kyphoplasty at L3-4 in June 2014. Also with evidence of rib fractures and insufficiency fractures bilaterally of the sacral alae, noted by CT in March 2015. -- Denosumab started 12/08/2013, given as prolia Q6 months which is what has been approved by his insurance, most recent dose 03/29/2015  (12) chronic back pain and hip pain controlled with OxyContin and hydrocodone/APAP.  (12) nausea: well controlled on current meds  (13) Positive c.diff, 03/08/2013, on Flagyl 500 mg TID x 20 days, then on oral vanco with Questran, showing improvement; positive when repeated April 2014; Negative x 3 since then; repeat 12/07/2015 again negative  (14) persistently increased BUN and potassium: Improved on daily bicarbonate  (15) Hypertension, on labetalol, cardizem, lisinopril, and furosemide; managed by Dr. SBrigitte Pulse (16) steroid induced hyperglycemia/ DM II: managed by Dr SBrigitte Pulse was severely hypoglycemic on admission, currently on SSI   (17) hypogammaglobulinemia-- requiring intermittent supplementation, most recent dose 10/18/2015  (18) squamous cell CA in situ removed from left parietal scalp October 2014  (19) severe SOB with pulmonary infiltrates: workup so far has included   (a) echo 12/06/2015 shows EF 45-50% and no wall motion abnormalities  (b) EKG shows new (c/w 2014) left fascicular block, old RBBB, no ischemic changes  (c) troponin is minimally and persistently elevated, not c/w MI  (d) V/Q scan 12/07/2015 low probability  (e) negative influenza panel  (f) aspergillus Ab, CMV titers ansd viral load, IgG level pending    Plan: Again, appreciate renal, ID and PUL's  help!  At this point, we are waiting on results from bronchoscopy studies. That may take several days.  Pending those results, I am going to start higher-dose steroids. We will obtain a baseline CXR this PM and follow that as well as clinical response. Since this will increase his immunosuppression I will decrease his prograf dose-- we have yet to get results from blood draws as far back as 12/16.  Creatinine is a little worse today. We did  resume sulfa--recall he has been on that prophylactically for past 3 years. Also his lasix has been increased.   Discussed all this with Coralyn Mark. I am hoping we will have a definite plan and hopefully some clinical improvement by later this week. Otherwise we will consider transfer to [facility] transplant program.  Chauncey Cruel, MD 12/10/2015  6:05 PM Medical Oncology and Hematology [facility] [address] [place], [place] [address] Tel. [phone]    Fax. [phone]

## 2015-12-10 NOTE — Procedures (Signed)
PCCM Video Bronchoscopy Procedure Note  The patient was informed of the risks (including but not limited to bleeding, infection, respiratory failure, lung injury, tooth/oral injury) and benefits of the procedure and gave consent, see chart.  Indication: R/O pneumonia in an immunocompromised patient  Post Procedure Diagnosis: Pneumonia  Location: Right middle lobe  Condition pre procedure: Stable  Medications for procedure: 4 mg versed, 75 mcg fentanyl   Procedure description: The bronchoscope was introduced through the nose and passed to the bilateral lungs to the level of the subsegmental bronchi throughout the tracheobronchial tree.  Airway exam revealed no abnormality, no secretions or bleeding   Procedures performed: RML lateral subsegment BAL, brushing and transbronchial biopsies  Specimens sent: Cultures, cytology, flow cytometry, PCP, Cell count with diff, pathology  Condition post procedure: Stable   EBL: 5 cc  Complications: None  CXR ordered.  Marshell Garfinkel MD [facility] Pager [phone] If no answer or after 3pm call: [phone] 12/10/2015, 10:13 AM

## 2015-12-11 ENCOUNTER — Inpatient Hospital Stay (HOSPITAL_COMMUNITY): Payer: BC Managed Care – PPO

## 2015-12-11 ENCOUNTER — Encounter (HOSPITAL_COMMUNITY): Payer: Self-pay | Admitting: Pulmonary Disease

## 2015-12-11 LAB — GLUCOSE, CAPILLARY
GLUCOSE-CAPILLARY: 161 mg/dL — AB (ref 65–99)
GLUCOSE-CAPILLARY: 169 mg/dL — AB (ref 65–99)
Glucose-Capillary: 165 mg/dL — ABNORMAL HIGH (ref 65–99)
Glucose-Capillary: 153 mg/dL — ABNORMAL HIGH (ref 65–99)

## 2015-12-11 LAB — PHOSPHORUS: Phosphorus: 4.3 mg/dL (ref 2.5–4.6)

## 2015-12-11 LAB — CBC
HEMATOCRIT: 32.7 % — AB (ref 39.0–52.0)
Hemoglobin: 10.9 g/dL — ABNORMAL LOW (ref 13.0–17.0)
MCH: 30.1 pg (ref 26.0–34.0)
MCHC: 33.3 g/dL (ref 30.0–36.0)
MCV: 90.3 fL (ref 78.0–100.0)
Platelets: 116 10*3/uL — ABNORMAL LOW (ref 150–400)
RBC: 3.62 MIL/uL — AB (ref 4.22–5.81)
RDW: 16.8 % — ABNORMAL HIGH (ref 11.5–15.5)
WBC: 3.2 10*3/uL — AB (ref 4.0–10.5)

## 2015-12-11 LAB — COMPREHENSIVE METABOLIC PANEL
ALT: 17 U/L (ref 17–63)
ANION GAP: 12 (ref 5–15)
AST: 21 U/L (ref 15–41)
Albumin: 2.6 g/dL — ABNORMAL LOW (ref 3.5–5.0)
Alkaline Phosphatase: 70 U/L (ref 38–126)
BUN: 82 mg/dL — ABNORMAL HIGH (ref 6–20)
CHLORIDE: 112 mmol/L — AB (ref 101–111)
CO2: 15 mmol/L — AB (ref 22–32)
Calcium: 7.8 mg/dL — ABNORMAL LOW (ref 8.9–10.3)
Creatinine, Ser: 3.31 mg/dL — ABNORMAL HIGH (ref 0.61–1.24)
GFR calc non Af Amer: 18 mL/min — ABNORMAL LOW (ref >60)
GFR, EST AFRICAN AMERICAN: 20 mL/min — AB (ref >60)
Glucose, Bld: 174 mg/dL — ABNORMAL HIGH (ref 65–99)
POTASSIUM: 4.9 mmol/L (ref 3.5–5.1)
SODIUM: 139 mmol/L (ref 135–145)
Total Bilirubin: 0.6 mg/dL (ref 0.3–1.2)
Total Protein: 4.8 g/dL — ABNORMAL LOW (ref 6.5–8.1)

## 2015-12-11 LAB — PNEUMOCYSTIS JIROVECI SMEAR BY DFA: Pneumocystis jiroveci Ag: NEGATIVE

## 2015-12-11 LAB — CMV DNA, QUANTITATIVE, PCR
CMV DNA Quant: NEGATIVE IU/mL
Log10 CMV Qn DNA Pl: UNDETERMINED log10 IU/mL

## 2015-12-11 LAB — TACROLIMUS LEVEL: TACROLIMUS (FK506) - LABCORP: 19.7 ng/mL (ref 2.0–20.0)

## 2015-12-11 LAB — PROCALCITONIN: PROCALCITONIN: 5.11 ng/mL

## 2015-12-11 MED ORDER — SODIUM BICARBONATE 8.4 % IV SOLN
INTRAVENOUS | Status: DC
  Administered 2015-12-11 – 2015-12-16 (×5): via INTRAVENOUS
  Filled 2015-12-11 (×5): qty 100

## 2015-12-11 MED ORDER — DIPHENHYDRAMINE HCL 25 MG PO CAPS: 25.0000 mg | ORAL_CAPSULE | Freq: Once | ORAL | Status: DC

## 2015-12-11 MED ORDER — ACETAMINOPHEN 325 MG PO TABS
650.0000 mg | ORAL_TABLET | Freq: Once | ORAL | Status: AC
  Administered 2015-12-11: 650 mg via ORAL
  Filled 2015-12-11: qty 2

## 2015-12-11 MED ORDER — IMMUNE GLOBULIN (HUMAN) 20 GM/200ML IV SOLN
1.0000 g/kg | INTRAVENOUS | Status: DC
  Administered 2015-12-11: 95 g via INTRAVENOUS
  Filled 2015-12-11 (×2): qty 950

## 2015-12-11 MED ORDER — LEVOFLOXACIN 750 MG PO TABS
750.0000 mg | ORAL_TABLET | ORAL | Status: AC
  Administered 2015-12-11 – 2015-12-17 (×4): 750 mg via ORAL
  Filled 2015-12-11 (×4): qty 1

## 2015-12-11 NOTE — Progress Notes (Signed)
[facility]   Reason for visit: Follow up on hypoxia  Interval History: Has remained afebrile, tachypneic, hypoxic.  Feels a bit better.  Eating.  PCP DFA negative.  Gram stain with WBCs.  He did have a recent viral illness about 3 weeks ago.    Physical Exam: Constitutional:  Filed Vitals:   12/11/15 0514 12/11/15 0802  BP: 118/68 127/75  Pulse: 65 63  Temp: 98 F (36.7 C)   Resp: 22    patient appears in NAD Respiratory: increased respiratory effort; CTA B Cardiovascular: RRR  Review of Systems: Constitutional: negative for fevers and chills Musculoskeletal: negative for myalgias and arthralgias  Lab Results  Component Value Date   WBC 3.2* 12/11/2015   HGB 10.9* 12/11/2015   HCT 32.7* 12/11/2015   MCV 90.3 12/11/2015   PLT 116* 12/11/2015    Lab Results  Component Value Date   CREATININE 3.31* 12/11/2015   BUN 82* 12/11/2015   NA 139 12/11/2015   K 4.9 12/11/2015   CL 112* 12/11/2015   CO2 15* 12/11/2015    Lab Results  Component Value Date   ALT 17 12/11/2015   AST 21 12/11/2015   ALKPHOS 70 12/11/2015     Microbiology: Recent Results (from the past 240 hour(s))  C difficile quick scan w PCR reflex     Status: None   Collection Time: 12/08/15  3:02 PM  Result Value Ref Range Status   C Diff antigen NEGATIVE NEGATIVE Final   C Diff toxin NEGATIVE NEGATIVE Final   C Diff interpretation Negative for toxigenic C. difficile  Final  AFB culture with smear     Status: None (Preliminary result)   Collection Time: 12/10/15 11:33 AM  Result Value Ref Range Status   Specimen Description BRONCHIAL ALVEOLAR LAVAGE  Final   Special Requests Immunocompromised  Final   Acid Fast Smear   Final    NO ACID FAST BACILLI SEEN Performed at [facility]    Culture   Final    CULTURE WILL BE EXAMINED FOR 6 WEEKS BEFORE ISSUING A FINAL REPORT Performed at [facility]    Report Status PENDING  Incomplete  Culture,  bal-quantitative     Status: None (Preliminary result)   Collection Time: 12/10/15 11:35 AM  Result Value Ref Range Status   Specimen Description BRONCHIAL ALVEOLAR LAVAGE  Final   Special Requests Immunocompromised  Final   Gram Stain   Final    MODERATE WBC PRESENT, PREDOMINANTLY MONONUCLEAR RARE SQUAMOUS EPITHELIAL CELLS PRESENT NO ORGANISMS SEEN Performed at [facility]    Culture PENDING  Incomplete   Report Status PENDING  Incomplete  Fungus Culture with Smear     Status: None (Preliminary result)   Collection Time: 12/10/15 11:38 AM  Result Value Ref Range Status   Specimen Description BRONCHIAL ALVEOLAR LAVAGE  Final   Special Requests Immunocompromised  Final   Fungal Smear   Final    NO YEAST OR FUNGAL ELEMENTS SEEN Performed at [facility]    Culture   Final    CULTURE IN PROGRESS FOR FOUR WEEKS Performed at [facility]    Report Status PENDING  Incomplete  Pneumocystis smear by DFA     Status: None   Collection Time: 12/10/15 11:39 AM  Result Value Ref Range Status   Specimen Source-[facility] BRONCHIAL ALVEOLAR LAVAGE  Final   Pneumocystis jiroveci Ag NEGATIVE  Final    Comment: Performed at [facility]  [facility]  Tissue culture     Status: None (Preliminary result)   Collection Time: 12/10/15 11:40 AM  Result Value Ref Range Status   Specimen Description BIOPSY LUNG TRANSBRONCHIAL  Final   Special Requests Immunocompromised  Final   Gram Stain   Final    NO WBC SEEN NO ORGANISMS SEEN Performed at [facility]    Culture   Final    NO GROWTH 1 DAY Note: Gram Stain Report Called to,Read Back By and Verified With: [facility] TORRES 12/10/15 @ [id number] Performed at [facility]    Report Status PENDING  Incomplete    Impression/Plan:  1. Airspace disease - differential broad with immunosuppression. Likely infection, viral vs bacterial vs atypical.  Some improvement today.  Bactrim stopped appropriately with  negative DFA for PCP.  I will empirically add levaquin for now, see if he continues to improve.  Would also consider post influenza bacterial pneumonia (ie Staph aureus) if worsens again.  2.  CKD - slight worsening creat again today.  Now off of Bactrim.

## 2015-12-11 NOTE — Progress Notes (Signed)
ANTIBIOTIC CONSULT NOTE - INITIAL  Pharmacy Consult for levaquin Indication:HCAP  Allergies  Allergen Reactions  . Benadryl [Diphenhydramine Hcl]     "Restless leg syndrome"    Patient Measurements: Height: 5\' 6"  (167.6 cm) Weight: 213 lb 9.6 oz (96.888 kg) IBW/kg (Calculated) : 63.8   Vital Signs: Temp: 98 F (36.7 C) (12/20 0514) Temp Source: Oral (12/20 0514) BP: 127/75 mmHg (12/20 0802) Pulse Rate: 63 (12/20 0802) Intake/Output from previous day: 12/19 0701 - 12/20 0700 In: 776.8 [P.O.:322; I.V.:66.8; IV Piggyback:388] Out: 300 [Urine:300] Intake/Output from this shift: Total I/O In: 351 [P.O.:351] Out: -   Labs:  Recent Labs  12/09/15 0510 12/10/15 0430 12/11/15 0536  WBC 5.0 5.7 3.2*  HGB 11.4* 11.8* 10.9*  PLT 129* 118* 116*  CREATININE 3.09* 3.17* 3.31*   Estimated Creatinine Clearance: 22.9 mL/min (by C-G formula based on Cr of 3.31). No results for input(s): VANCOTROUGH, VANCOPEAK, VANCORANDOM, GENTTROUGH, GENTPEAK, GENTRANDOM, TOBRATROUGH, TOBRAPEAK, TOBRARND, AMIKACINPEAK, AMIKACINTROU, AMIKACIN in the last 72 hours.   Microbiology: Recent Results (from the past 720 hour(s))  C difficile quick scan w PCR reflex     Status: None   Collection Time: 12/08/15  3:02 PM  Result Value Ref Range Status   C Diff antigen NEGATIVE NEGATIVE Final   C Diff toxin NEGATIVE NEGATIVE Final   C Diff interpretation Negative for toxigenic C. difficile  Final  Culture, bal-quantitative     Status: None (Preliminary result)   Collection Time: 12/10/15 11:35 AM  Result Value Ref Range Status   Specimen Description BRONCHIAL ALVEOLAR LAVAGE  Final   Special Requests Immunocompromised  Final   Gram Stain   Final    MODERATE WBC PRESENT, PREDOMINANTLY MONONUCLEAR RARE SQUAMOUS EPITHELIAL CELLS PRESENT NO ORGANISMS SEEN Performed at [facility]    Culture PENDING  Incomplete   Report Status PENDING  Incomplete  Fungus Culture with Smear     Status: None  (Preliminary result)   Collection Time: 12/10/15 11:38 AM  Result Value Ref Range Status   Specimen Description BRONCHIAL ALVEOLAR LAVAGE  Final   Special Requests Immunocompromised  Final   Fungal Smear   Final    NO YEAST OR FUNGAL ELEMENTS SEEN Performed at [facility]    Culture   Final    CULTURE IN PROGRESS FOR FOUR WEEKS Performed at [facility]    Report Status PENDING  Incomplete  Pneumocystis smear by DFA     Status: None   Collection Time: 12/10/15 11:39 AM  Result Value Ref Range Status   Specimen Source-PJSRC BRONCHIAL ALVEOLAR LAVAGE  Final   Pneumocystis jiroveci Ag NEGATIVE  Final    Comment: Performed at [facility]  Tissue culture     Status: None (Preliminary result)   Collection Time: 12/10/15 11:40 AM  Result Value Ref Range Status   Specimen Description BIOPSY LUNG TRANSBRONCHIAL  Final   Special Requests Immunocompromised  Final   Gram Stain   Final    NO WBC SEEN NO ORGANISMS SEEN Performed at [facility]    Culture   Final    NO GROWTH 1 DAY Note: Gram Stain Report Called to,Read Back By and Verified With: [facility] TORRES 12/10/15 @ 0707P YIMSU Performed at [facility]    Report Status PENDING  Incomplete    Medical History: Past Medical History  Diagnosis Date  . 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 Diverticular disease   . Hyperlipidemia   . Obesity   .  Hypertension   . Hiatal hernia   . CMV (cytomegalovirus) antibody positive     pre-transplant, with seroconversion x2 pst-transplant  . Right bundle branch block     pre-transplant  . CKD (chronic kidney disease) stage 2, GFR 60-89 ml/min   . Pancytopenia ([place])   . Atrial fibrillation ([place])     post-transplant  . Myopathy   . 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 Chronic GVHD complicating bone marrow transplantation ([place]) 12/05/2012  . Diarrhea in adult patient 12/05/2012    Due to active GVHD  . Rash of face 12/05/2012     Due to GVHD  . Hypomagnesemia 01/26/2013  . Left hip pain 12/01/2013  . Steroid-induced diabetes ([place])     novalog  . Leukemia, chronic lymphoid ([place])   . CLL (chronic lymphocytic leukemia) ([place]) 12/05/2012    Dx 07/1999; started Rx 12/06  AlloBMT 3/13    Medications:  Scheduled:  . sodium chloride   Intravenous Once  . acyclovir  800 mg Oral BID  . budesonide  3 mg Oral TID  . calcium gluconate  2 g Intravenous Q12H  . cholestyramine  4 g Oral BID  . diltiazem  240 mg Oral Daily  . enoxaparin (LOVENOX) injection  30 mg Subcutaneous Q24H  . fluconazole  100 mg Oral Daily  . furosemide  160 mg Oral TID  . IMMUNE GLOBULIN 10% (HUMAN) IV - For Fluid Restriction Only  1 g/kg Intravenous Q24H  . insulin aspart  0-20 Units Subcutaneous TID WC  . insulin aspart  6 Units Subcutaneous TID WC  . labetalol  400 mg Oral BID  . levofloxacin  750 mg Oral Q48H  . loratadine  10 mg Oral Daily  . methylPREDNISolone (SOLU-MEDROL) injection  60 mg Intravenous Q6H  . pantoprazole  40 mg Oral Daily  . sertraline  50 mg Oral q morning - 10a  . sodium bicarbonate  1,300 mg Oral TID  . tacrolimus  1 mg Oral BID   Assessment: 69 y.o. Male well know to pharmacy for dosing bactrim.  Pharmacy now consulted to dose levaquin for HCAP  Goal of Therapy:  levaquin per indication and renal function  Plan:  levaquin 750mg  po q48h for CrCL >35mls/min Follow renal function  Dolly Rias RPh 12/11/2015, 1:24 PM Pager [phone]

## 2015-12-11 NOTE — Progress Notes (Signed)
Timothy [name]   DOB:05-21-1946   IW#:[id number]   ERD#:[id number]  Subjective: feels maybe some minimal improvement on steroids. Only one BM today, and "the most normal one so far." Very SOB just getting in and out of bed, not however at rest. No cough or phlegm; ST from bronch better. Wife in room.  Objective: middle aged White male examined in recliner  Filed Vitals:   12/11/15 1802 12/11/15 1830  BP: 123/58 129/71  Pulse: 69 71  Temp: 98.6 F (37 C) 98.2 F (36.8 C)  Resp: 22 21    Body mass index is 34.49 kg/(m^2).  Intake/Output Summary (Last 24 hours) at 12/11/15 1919 Last data filed at 12/11/15 1456  Gross per 24 hour  Intake 1100.5 ml  Output    300 ml  Net  800.5 ml     No rash, no oral lesions to suggest GVH as culprit (lack of diarrhea also c/w this)  CBG (last 3)   Recent Labs  12/11/15 0751 12/11/15 1157 12/11/15 1724  GLUCAP 161* 165* 153*     Labs:  Lab Results  Component Value Date   WBC 3.2* 12/11/2015   HGB 10.9* 12/11/2015   HCT 32.7* 12/11/2015   MCV 90.3 12/11/2015   PLT 116* 12/11/2015   NEUTROABS 4.4 12/06/2015    '@LASTCHEMISTRY'$ @  Urine Studies No results for input(s): UHGB, CRYS in the last 72 hours.  Invalid input(s): UACOL, UAPR, USPG, UPH, UTP, UGL, UKET, UBIL, UNIT, UROB, [place], UEPI, UWBC, Junie Panning [place], [place], [place]  Basic Metabolic Panel:  Recent Labs Lab 12/06/15 1139  12/06/15 1440  12/07/15 1010 12/08/15 0440 12/09/15 0510 12/10/15 0430 12/11/15 0536  NA  --   < > 141  --  142 144 141 141 139  K  --   < > 4.8  --  4.4 4.6 4.4 4.5 4.9  CL  --   < > 118*  --  117* 122* 116* 115* 112*  CO2  --   < > 12*  --  13* 12* 14* 18* 15*  GLUCOSE  --   < > 151*  --  99 186* 161* 181* 174*  BUN  --   < > 82*  --  78* 75* 79* 83* 82*  CREATININE  --   < > 3.77*  --  3.65* 3.22* 3.09* 3.17* 3.31*  CALCIUM  --   < > 5.8*  < > 6.2* 7.6* 7.4* 7.7* 7.8*  MG 1.4*  --  1.3*  --   --  1.9  --   --   --   PHOS  --   --  5.7*  --   --    --   --   --  4.3  < > = values in this interval not displayed. GFR Estimated Creatinine Clearance: 22.9 mL/min (by C-G formula based on Cr of 3.31). Liver Function Tests:  Recent Labs Lab 12/07/15 1010 12/08/15 0440 12/09/15 0510 12/10/15 0430 12/11/15 0536  AST 32 '28 22 22 21  '$ ALT '27 23 20 19 17  '$ ALKPHOS 76 73 74 81 70  BILITOT 0.6 0.4 0.4 0.4 0.6  PROT 5.6* 5.0* 5.4* 5.5* 4.8*  ALBUMIN 3.3* 2.9* 3.0* 2.9* 2.6*   No results for input(s): LIPASE, AMYLASE in the last 168 hours. No results for input(s): AMMONIA in the last 168 hours. Coagulation profile  Recent Labs Lab 12/10/15 0745  INR 1.21    CBC:  Recent Labs Lab 12/06/15 1138 12/06/15 1440  12/08/15 0440 12/09/15 0510 12/10/15 0430 12/11/15 0536  WBC 6.7 5.4 5.7 5.0 5.7 3.2*  NEUTROABS 5.7 4.4  --   --   --   --   HGB 13.1 12.1* 11.4* 11.4* 11.8* 10.9*  HCT 39.4 36.5* 34.9* 35.5* 35.1* 32.7*  MCV 90.6 91.5 91.6 90.8 90.2 90.3  PLT 107* 120* 114* 129* 118* 116*   Cardiac Enzymes:  Recent Labs Lab 12/06/15 1440 12/06/15 2150 12/07/15 0200 12/07/15 1010 12/08/15 0440  CKTOTAL  --   --   --  102  --   CKMB  --   --   --  10.5*  --   TROPONINI 0.04* 0.05* 0.05*  --  0.05*   BNP: Invalid input(s): POCBNP CBG:  Recent Labs Lab 12/10/15 1637 12/10/15 1942 12/11/15 0751 12/11/15 1157 12/11/15 1724  GLUCAP 85 88 161* 165* 153*   D-Dimer No results for input(s): DDIMER in the last 72 hours. Hgb A1c No results for input(s): HGBA1C in the last 72 hours. Lipid Profile No results for input(s): CHOL, HDL, LDLCALC, TRIG, CHOLHDL, LDLDIRECT in the last 72 hours. Thyroid function studies No results for input(s): TSH, T4TOTAL, T3FREE, THYROIDAB in the last 72 hours.  Invalid input(s): FREET3 Anemia work up No results for input(s): VITAMINB12, FOLATE, FERRITIN, TIBC, IRON, RETICCTPCT in the last 72 hours. Microbiology Recent Results (from the past 240 hour(s))  C difficile quick scan w PCR  reflex     Status: None   Collection Time: 12/08/15  3:02 PM  Result Value Ref Range Status   C Diff antigen NEGATIVE NEGATIVE Final   C Diff toxin NEGATIVE NEGATIVE Final   C Diff interpretation Negative for toxigenic C. difficile  Final  AFB culture with smear     Status: None (Preliminary result)   Collection Time: 12/10/15 11:33 AM  Result Value Ref Range Status   Specimen Description BRONCHIAL ALVEOLAR LAVAGE  Final   Special Requests Immunocompromised  Final   Acid Fast Smear   Final    NO ACID FAST BACILLI SEEN Performed at [facility]    Culture   Final    CULTURE WILL BE EXAMINED FOR 6 WEEKS BEFORE ISSUING A FINAL REPORT Performed at [facility]    Report Status PENDING  Incomplete  Culture, bal-quantitative     Status: None (Preliminary result)   Collection Time: 12/10/15 11:35 AM  Result Value Ref Range Status   Specimen Description BRONCHIAL ALVEOLAR LAVAGE  Final   Special Requests Immunocompromised  Final   Gram Stain   Final    MODERATE WBC PRESENT, PREDOMINANTLY MONONUCLEAR RARE SQUAMOUS EPITHELIAL CELLS PRESENT NO ORGANISMS SEEN Performed at [facility]    Culture PENDING  Incomplete   Report Status PENDING  Incomplete  Fungus Culture with Smear     Status: None (Preliminary result)   Collection Time: 12/10/15 11:38 AM  Result Value Ref Range Status   Specimen Description BRONCHIAL ALVEOLAR LAVAGE  Final   Special Requests Immunocompromised  Final   Fungal Smear   Final    NO YEAST OR FUNGAL ELEMENTS SEEN Performed at [facility]    Culture   Final    CULTURE IN PROGRESS FOR FOUR WEEKS Performed at [facility]    Report Status PENDING  Incomplete  Pneumocystis smear by DFA     Status: None   Collection Time: 12/10/15 11:39 AM  Result Value Ref Range Status   Specimen Source-PJSRC BRONCHIAL ALVEOLAR LAVAGE  Final  Pneumocystis jiroveci Ag NEGATIVE  Final    Comment: Performed at [place] [facility]  Tissue culture     Status: None (Preliminary result)   Collection Time: 12/10/15 11:40 AM  Result Value Ref Range Status   Specimen Description BIOPSY LUNG TRANSBRONCHIAL  Final   Special Requests Immunocompromised  Final   Gram Stain   Final    NO WBC SEEN NO ORGANISMS SEEN Performed at [facility]    Culture   Final    NO GROWTH 1 DAY Note: Gram Stain Report Called to,Read Back By and Verified With: [facility] TORRES 12/10/15 @ 0707P YIMSU Performed at [facility]    Report Status PENDING  Incomplete   Tacrolimus level 12/07/2015 pending   Studies:  Dg Chest 2 View  12/11/2015  CLINICAL DATA:  Weakness and shortness of breath today. History of pneumonia and leukemia. EXAM: CHEST  2 VIEW COMPARISON:  12/10/2015 FINDINGS: Right IJ Port-A-Cath unchanged. Lungs are somewhat hypoinflated and demonstrate continued airspace opacification over the right lobe and posterior lower lobes with slight interval improvement. Stable hazy opacification over the left mid upper lung. Small amount of bilateral pleural fluid posteriorly. Stable cardiomegaly. Remainder the exam is unchanged. IMPRESSION: Hypoinflation with multifocal airspace process with slight interval improvement likely multifocal pneumonia. Small amount of posterior pleural fluid. Right IJ Port-A-Cath unchanged. Electronically Signed   By: Marin Olp M.D.   On: 12/11/2015 11:51   Dg Chest 2 View  12/10/2015  CLINICAL DATA:  Shortness of breath. History of chronic lymphocytic leukemia EXAM: CHEST  2 VIEW COMPARISON:  Study obtained earlier in the day and chest CT December 06, 2015 FINDINGS: There is extensive airspace consolidation in the right lower lobe. There is more patchy infiltrate throughout much of the left upper lobe and to a lesser extent right upper lobe, stable. Compared to earlier in the day, there is new consolidation lateral left base. Heart is upper normal in size with pulmonary vascular within normal  limits. Port-A-Cath tip is in the superior vena cava. No adenopathy appreciable. Multiple compression fractures in the thoracic spine appear stable. IMPRESSION: Multifocal pneumonia with new infiltrate in the lateral left base compared to earlier in the day. Other areas of infiltrate appears stable. No change in cardiac silhouette. Electronically Signed   By: Lowella Grip III M.D.   On: 12/10/2015 20:29   Dg Chest Port 1 View  12/10/2015  CLINICAL DATA:  69 year old male post bronchoscopy right middle lobe. Subsequent encounter. EXAM: PORTABLE CHEST 1 VIEW COMPARISON:  12/08/2015. FINDINGS: Curvilinear structure right lung apex probably pleural reflection associated with rib rather than tiny pneumothorax. Attention to this on follow up. Asymmetric airspace disease greater on the right. When compared to the most recent chest x-ray, decrease in degree of consolidation right lung apex and slight increase in left perihilar consolidation. Pulmonary vascular congestion. Right MediPort catheter tip mid superior vena cava level. Heart size top-normal to slightly enlarged. IMPRESSION: Curvilinear structure right lung apex probably pleural reflection associated with rib rather than tiny pneumothorax. Attention to this on follow up. Asymmetric airspace disease greater on the right. When compared to the most recent chest x-ray, decrease in degree of consolidation right lung apex and slight increase in left perihilar consolidation. Pulmonary vascular congestion. Electronically Signed   By: Genia Del M.D.   On: 12/10/2015 10:26   Dg C-arm Bronchoscopy  12/10/2015  CLINICAL DATA:  C-ARM BRONCHOSCOPY Fluoroscopy was utilized by the requesting physician.  No  radiographic interpretation.    Assessment: 69 y.o. [place] man with a history of well-differentiated lymphocytic lymphoma/ chronic lymphoid leukemia initially diagnosed in 2000, not requiring intervention until 2006; s/p allogeneic transplant March 2013,  now admitted with worsening SOB, bilateral pulmonary infiltrates and acute on chronic kidney injury, but no fever, mucositis, or rash. His CLL history is as follows:  (1) fludarabine/cyclophosphamide/rituximab x5 completed May 2007.   (2) rituximab for 8 doses October 2010, with partial response   (3) Leustatin and ofatumumab weekly x8 July to September 2011 followed by maintenance ofatumumab every 2 months, with initial response but rising counts September 2012   (4) status-post unrelated donor stem-cell transplant 02/24/2012 at [facility]  (a) conditioning regimen consisted of fludarabine + TBI at 200 cGy, followed by rituximab x27;  (b) CMV reactivation x3 (patient CMV positive, donor negative), s/p ganciclovir treatment; 3d reactivation August 2013, s/p gancyclovir, with negative PCR mid-September 2013; last gancyclovir dose 10/06/2012 (c) Chronic GVHD: involving gut and skin, treated with steroids, tacrolimus and MMF. MMF was eventually d/c'd and tacrolimus currently at a dose of 1.'5mg'$  BID (d) atrial fibrillation: resolved on brief amiodarone regimen (e) steroid-induced myopathy: improving  (f) hypomagnesemia: improved after d/c gancyclovir, needs continuing support (g) hypogammaglobulinemia: requiring IVIG most recently 08/03/2014. (h) history of elevated triglycerides (606 on 07/14/2012)  (i) adrenal insufficiency: on prednisone and budesonide (j) pancytopenia,resolved (k) brief episode of neutropenia ([place] [address]) February 2015, accompanied by diarrhea; resolved   (5) restaging studies February-March 2015 including CT scans, flow cytometry, and bone marrow biopsy, showed no evidence of residual chronic lymphoid leukemia. (a) repeat bone marrow biopsy 02/27/2015 showed no evidence of chronic lymphoid leukemia and also no dyspoiesis. Flow cytometry showed no B cells.  (6) recurrent GVHD (skin rash, mouth changes, severe diarrhea and gastric/duodenal/colonic biopsies  11/17/2012 c/w GVHD grade 2) : now grade 1 to inactive  (7) malnutrition -- on VITAL supplement in addition to regular diet; on Marinol for anorexia  (8) testosterone deficiency--on patch   (9) deconditioning: Especially quad weakness; continuing rehabilitation exercises  (10) CKI, hypomagnesemia; receives IVF support w magnesium weekly  (11) severe steroid-induced osteoporosis with compression fractures: received pamidronate 12/18/2012. Status post kyphoplasty at L3-4 in June 2014. Also with evidence of rib fractures and insufficiency fractures bilaterally of the sacral alae, noted by CT in March 2015. -- Denosumab started 12/08/2013, given as prolia Q6 months which is what has been approved by his insurance, most recent dose 03/29/2015  (12) chronic back pain and hip pain controlled with OxyContin and hydrocodone/APAP.  (12) nausea: well controlled on current meds  (13) Positive c.diff, 03/08/2013, on Flagyl 500 mg TID x 20 days, then on oral vanco with Questran, showing improvement; positive when repeated April 2014; Negative x 3 since then; repeat 12/07/2015 again negative  (14) persistently increased BUN and potassium: Improved on daily bicarbonate  (15) Hypertension, on labetalol, cardizem, lisinopril, and furosemide; managed by Dr. Brigitte Pulse  (16) steroid induced hyperglycemia/ DM II: managed by Dr Brigitte Pulse; was severely hypoglycemic on admission, currently on SSI   (17) hypogammaglobulinemia-- requiring intermittent supplementation, most recent dose 10/18/2015  (18) squamous cell CA in situ removed from left parietal scalp October 2014  (19) severe SOB with pulmonary infiltrates: workup so far has included   (a) echo 12/06/2015 shows EF 45-50% and no wall motion abnormalities  (b) EKG shows new (c/w 2014) left fascicular block, old RBBB, no ischemic changes  (c) troponin is minimally and persistently elevated, not c/w MI  (d)  V/Q scan 12/07/2015 low probability  (e) negative  influenza panel  (f) .CMV IgG positive but IgM negative  (g) PCP screen from BAL negative  (h) IgG level <400-- replaced today    Plan:  Greatly appreciate PUL/ID?Renal consultants ' help.  Today is day 2 of steroids. There may have been minimal improvement in the CXR. He remains severely SOB with minimal activity.  Have dropped tacrolimus dose given levels on huigh side and worse renal function; may have to increase as he comes off steroids.  So far PCP screen from BAL was negative; given concerns re renal injury from septra and the fact that he has been on PCP prophylaxis for the past 3 years, I have stopped septra for now.  Studies show no AFB, no fungal elements, and no bacterial organisms in BAL. Cell count is mixed mononuclear and polys but few B cells in this patient whose B-cells were largely eliminated during CLL treatment. CD4/CD8 is 0.17. Cytology is negative for malignancy and biopsy shows minimal inflammation  I have to believe we are dealing with a viral infection. CMV IgM is negative, with positive IgG. .Influenza panel was negative. Would ask ID what other studies we could obtain to try to nail down diagnosis, if possible.   He received IgG w/o event. Is on acyclovir 800 po BID. Should we empirically intensify antivirals?  Will follow CXR in AM.   Chauncey Cruel, MD 12/11/2015  7:19 PM Medical Oncology and Hematology [facility] [address] [place], [place] [address] Tel. [phone]    Fax. [phone]

## 2015-12-11 NOTE — Progress Notes (Signed)
Subjective: Interval History: has no complaint,not sure how is doing with urine.  Objective: Vital signs in last 24 hours: Temp:  [98 F (36.7 C)-98.7 F (37.1 C)] 98 F (36.7 C) (12/20 0514) Pulse Rate:  [63-80] 63 (12/20 0802) Resp:  [22-23] 22 (12/20 0514) BP: (118-128)/(63-75) 127/75 mmHg (12/20 0802) SpO2:  [89 %-93 %] 93 % (12/20 0533) Weight:  [96.888 kg (213 lb 9.6 oz)] 96.888 kg (213 lb 9.6 oz) (12/20 0721) Weight change:   Intake/Output from previous day: 12/19 0701 - 12/20 0700 In: 776.8 [P.O.:322; I.V.:66.8; IV Piggyback:388] Out: 300 [Urine:300] Intake/Output this shift:    General appearance: alert, cooperative, pale and less resp difficult Resp: alert, cooperative, pale and bruises, cushingoid Cardio: S1, S2 normal and systolic murmur: holosystolic 2/6, blowing at apex GI: obese,protub, pos bs Extremities: edema 1+  Lungs decreased bs, rales and rhonchi in bases  Lab Results:  Recent Labs  12/10/15 0430 12/11/15 0536  WBC 5.7 3.2*  HGB 11.8* 10.9*  HCT 35.1* 32.7*  PLT 118* 116*   BMET:  Recent Labs  12/10/15 0430 12/11/15 0536  NA 141 139  K 4.5 4.9  CL 115* 112*  CO2 18* 15*  GLUCOSE 181* 174*  BUN 83* 82*  CREATININE 3.17* 3.31*  CALCIUM 7.7* 7.8*   No results for input(s): PTH in the last 72 hours. Iron Studies: No results for input(s): IRON, TIBC, TRANSFERRIN, FERRITIN in the last 72 hours.  Studies/Results: Dg Chest 2 View  12/10/2015  CLINICAL DATA:  Shortness of breath. History of chronic lymphocytic leukemia EXAM: CHEST  2 VIEW COMPARISON:  Study obtained earlier in the day and chest CT December 06, 2015 FINDINGS: There is extensive airspace consolidation in the right lower lobe. There is more patchy infiltrate throughout much of the left upper lobe and to a lesser extent right upper lobe, stable. Compared to earlier in the day, there is new consolidation lateral left base. Heart is upper normal in size with pulmonary vascular  within normal limits. Port-A-Cath tip is in the superior vena cava. No adenopathy appreciable. Multiple compression fractures in the thoracic spine appear stable. IMPRESSION: Multifocal pneumonia with new infiltrate in the lateral left base compared to earlier in the day. Other areas of infiltrate appears stable. No change in cardiac silhouette. Electronically Signed   By: Lowella Grip III M.D.   On: 12/10/2015 20:29   Dg Chest Port 1 View  12/10/2015  CLINICAL DATA:  69 year old male post bronchoscopy right middle lobe. Subsequent encounter. EXAM: PORTABLE CHEST 1 VIEW COMPARISON:  12/08/2015. FINDINGS: Curvilinear structure right lung apex probably pleural reflection associated with rib rather than tiny pneumothorax. Attention to this on follow up. Asymmetric airspace disease greater on the right. When compared to the most recent chest x-ray, decrease in degree of consolidation right lung apex and slight increase in left perihilar consolidation. Pulmonary vascular congestion. Right MediPort catheter tip mid superior vena cava level. Heart size top-normal to slightly enlarged. IMPRESSION: Curvilinear structure right lung apex probably pleural reflection associated with rib rather than tiny pneumothorax. Attention to this on follow up. Asymmetric airspace disease greater on the right. When compared to the most recent chest x-ray, decrease in degree of consolidation right lung apex and slight increase in left perihilar consolidation. Pulmonary vascular congestion. Electronically Signed   By: Genia Del M.D.   On: 12/10/2015 10:26   Dg C-arm Bronchoscopy  12/10/2015  CLINICAL DATA:  C-ARM BRONCHOSCOPY Fluoroscopy was utilized by the requesting physician.  No radiographic  interpretation.    I have reviewed the patient's current medications.  Assessment/Plan: 1 AKI/CKD Cr in range he has been. Need to see if will diurese.  . Signif RTA. 2 Anemia 3 BMT per heme 4 Toxic prograf. dilt and CKD  lowering dose 5 pulm infilt etio not clear 6 Tremor. 7 obesity P lasix, bicarb, I&O, await studies    LOS: 5 days   Jantzen Pilger L 12/11/2015,11:36 AM

## 2015-12-11 NOTE — Progress Notes (Signed)
S/p bronch with BAL brushing and transbronchial biopsies yesterday. We will follow up on results when available. In the meantime we will follow peripherally. Please call back with any questions.  Marshell Garfinkel MD [facility] Pager [phone] If no answer or after 3pm call: [phone] 12/11/2015, 12:15 PM

## 2015-12-11 NOTE — Care Management Note (Signed)
Case Management Note  Patient Details  Name: Timothy [name] MRN: [id number] Date of Birth: 07-02-1946  Subjective/Objective:           69 yo admitted with Shortness of breath.         Action/Plan: From home with spouse.  Expected Discharge Date:   (unknown)               Expected Discharge Plan:  Home/Self Care  In-House Referral:     Discharge planning Services  CM Consult  Post Acute Care Choice:    Choice offered to:     DME Arranged:    DME Agency:     HH Arranged:    HH Agency:     Status of Service:  In process, will continue to follow  Medicare Important Message Given:    Date Medicare IM Given:    Medicare IM give by:    Date Additional Medicare IM Given:    Additional Medicare Important Message give by:     If discussed at [place] of Stay Meetings, dates discussed:    Additional Comments: Unsure at this time if pt will need home 02. Pt could potentially benefit from PT/OT to assess for disposition planning. CM will continue to follow. Lynnell Catalan, RN 12/11/2015, 2:12 PM

## 2015-12-12 ENCOUNTER — Other Ambulatory Visit: Payer: Self-pay | Admitting: Oncology

## 2015-12-12 ENCOUNTER — Other Ambulatory Visit: Payer: Self-pay

## 2015-12-12 ENCOUNTER — Inpatient Hospital Stay (HOSPITAL_COMMUNITY): Payer: BC Managed Care – PPO

## 2015-12-12 ENCOUNTER — Ambulatory Visit: Payer: Self-pay

## 2015-12-12 ENCOUNTER — Other Ambulatory Visit: Payer: Self-pay | Admitting: Nurse Practitioner

## 2015-12-12 LAB — GLUCOSE, CAPILLARY
GLUCOSE-CAPILLARY: 342 mg/dL — AB (ref 65–99)
GLUCOSE-CAPILLARY: 166 mg/dL — AB (ref 65–99)
Glucose-Capillary: 299 mg/dL — ABNORMAL HIGH (ref 65–99)
Glucose-Capillary: 260 mg/dL — ABNORMAL HIGH (ref 65–99)
Glucose-Capillary: 158 mg/dL — ABNORMAL HIGH (ref 65–99)

## 2015-12-12 LAB — CBC
HCT: 28.3 % — ABNORMAL LOW (ref 39.0–52.0)
Hemoglobin: 9.5 g/dL — ABNORMAL LOW (ref 13.0–17.0)
MCH: 30 pg (ref 26.0–34.0)
MCHC: 33.6 g/dL (ref 30.0–36.0)
MCV: 89.3 fL (ref 78.0–100.0)
PLATELETS: 106 10*3/uL — AB (ref 150–400)
RBC: 3.17 MIL/uL — ABNORMAL LOW (ref 4.22–5.81)
RDW: 16.9 % — AB (ref 11.5–15.5)
WBC: 3 10*3/uL — ABNORMAL LOW (ref 4.0–10.5)

## 2015-12-12 LAB — BLOOD GAS, ARTERIAL
ACID-BASE DEFICIT: 8.8 mmol/L — AB (ref 0.0–2.0)
BICARBONATE: 14.7 meq/L — AB (ref 20.0–24.0)
DELIVERY SYSTEMS: POSITIVE
Drawn by: 441261
EXPIRATORY PAP: 5
FIO2: 0.6
Inspiratory PAP: 14
O2 Saturation: 94.9 %
PATIENT TEMPERATURE: 98.6
PH ART: 7.387 (ref 7.350–7.450)
RATE: 12 resp/min
TCO2: 13.7 mmol/L (ref 0–100)
pCO2 arterial: 24.9 mmHg — ABNORMAL LOW (ref 35.0–45.0)
pO2, Arterial: 82.2 mmHg (ref 80.0–100.0)

## 2015-12-12 LAB — CULTURE, BAL-QUANTITATIVE W GRAM STAIN

## 2015-12-12 LAB — COMPREHENSIVE METABOLIC PANEL
ALBUMIN: 2.4 g/dL — AB (ref 3.5–5.0)
ALK PHOS: 66 U/L (ref 38–126)
ALT: 17 U/L (ref 17–63)
AST: 22 U/L (ref 15–41)
Anion gap: 12 (ref 5–15)
BUN: 91 mg/dL — AB (ref 6–20)
CALCIUM: 7.9 mg/dL — AB (ref 8.9–10.3)
CO2: 17 mmol/L — AB (ref 22–32)
CREATININE: 3.78 mg/dL — AB (ref 0.61–1.24)
Chloride: 107 mmol/L (ref 101–111)
GFR calc Af Amer: 17 mL/min — ABNORMAL LOW (ref >60)
GFR calc non Af Amer: 15 mL/min — ABNORMAL LOW (ref >60)
GLUCOSE: 346 mg/dL — AB (ref 65–99)
Potassium: 5.1 mmol/L (ref 3.5–5.1)
SODIUM: 136 mmol/L (ref 135–145)
Total Bilirubin: 0.4 mg/dL (ref 0.3–1.2)
Total Protein: 6.1 g/dL — ABNORMAL LOW (ref 6.5–8.1)

## 2015-12-12 LAB — MRSA PCR SCREENING: MRSA by PCR: NEGATIVE

## 2015-12-12 LAB — PHOSPHORUS: PHOSPHORUS: 4.7 mg/dL — AB (ref 2.5–4.6)

## 2015-12-12 LAB — ASPERGILLUS ANTIBODY (COMPLEMENT FIX)

## 2015-12-12 LAB — CULTURE, BAL-QUANTITATIVE: CULTURE: NORMAL

## 2015-12-12 LAB — TACROLIMUS LEVEL: Tacrolimus (FK506) - LabCorp: 18.1 ng/mL (ref 2.0–20.0)

## 2015-12-12 MED ORDER — INSULIN ASPART 100 UNIT/ML ~~LOC~~ SOLN
0.0000 [IU] | SUBCUTANEOUS | Status: DC
  Administered 2015-12-12 – 2015-12-13 (×2): 4 [IU] via SUBCUTANEOUS
  Administered 2015-12-13 (×2): 7 [IU] via SUBCUTANEOUS
  Administered 2015-12-13 (×2): 4 [IU] via SUBCUTANEOUS
  Administered 2015-12-13 (×2): 7 [IU] via SUBCUTANEOUS
  Administered 2015-12-14 (×2): 4 [IU] via SUBCUTANEOUS

## 2015-12-12 MED ORDER — SODIUM CHLORIDE 0.9 % IV SOLN
1.2500 mg/kg | INTRAVENOUS | Status: DC
  Administered 2015-12-12 – 2015-12-17 (×6): 125 mg via INTRAVENOUS
  Filled 2015-12-12 (×7): qty 125

## 2015-12-12 MED ORDER — FUROSEMIDE 10 MG/ML IJ SOLN
40.0000 mg | Freq: Once | INTRAMUSCULAR | Status: AC
  Administered 2015-12-12: 40 mg via INTRAVENOUS
  Filled 2015-12-12: qty 4

## 2015-12-12 MED ORDER — FUROSEMIDE 10 MG/ML IJ SOLN
160.0000 mg | Freq: Four times a day (QID) | INTRAVENOUS | Status: DC
  Administered 2015-12-12 – 2015-12-15 (×11): 160 mg via INTRAVENOUS
  Filled 2015-12-12 (×2): qty 10
  Filled 2015-12-12 (×2): qty 16
  Filled 2015-12-12: qty 4
  Filled 2015-12-12 (×2): qty 10
  Filled 2015-12-12: qty 16
  Filled 2015-12-12: qty 12
  Filled 2015-12-12 (×2): qty 16
  Filled 2015-12-12: qty 14
  Filled 2015-12-12: qty 16

## 2015-12-12 MED ORDER — MORPHINE SULFATE (PF) 4 MG/ML IV SOLN
INTRAVENOUS | Status: AC
  Administered 2015-12-12: 1 mg
  Filled 2015-12-12: qty 1

## 2015-12-12 NOTE — Progress Notes (Addendum)
ANTIBIOTIC CONSULT NOTE - INITIAL  Pharmacy Consult for ganciclovir Indication: viral infection, r/o CMV  Allergies  Allergen Reactions  . Benadryl [Diphenhydramine Hcl]     "Restless leg syndrome"    Patient Measurements: Height: 5\' 7"  (170.2 cm) Weight: 220 lb 3.8 oz (99.9 kg) IBW/kg (Calculated) : 66.1 Adjusted Body Weight:   Vital Signs: Temp: 97.3 F (36.3 C) (12/21 1157) Temp Source: Oral (12/21 0822) BP: 127/77 mmHg (12/21 1230) Pulse Rate: 66 (12/21 0932) Intake/Output from previous day: 12/20 0701 - 12/21 0700 In: 2772.3 [P.O.:591; I.V.:1821.3; IV Piggyback:360] Out: 201 [Urine:200; Stool:1] Intake/Output from this shift: Total I/O In: 410.3 [I.V.:344.3; IV Piggyback:66] Out: -   Labs:  Recent Labs  12/10/15 0430 12/11/15 0536 12/12/15 0550  WBC 5.7 3.2* 3.0*  HGB 11.8* 10.9* 9.5*  PLT 118* 116* 106*  CREATININE 3.17* 3.31* 3.78*   Estimated Creatinine Clearance: 20.8 mL/min (by C-G formula based on Cr of 3.78). No results for input(s): VANCOTROUGH, VANCOPEAK, VANCORANDOM, GENTTROUGH, GENTPEAK, GENTRANDOM, TOBRATROUGH, TOBRAPEAK, TOBRARND, AMIKACINPEAK, AMIKACINTROU, AMIKACIN in the last 72 hours.   Microbiology: Recent Results (from the past 720 hour(s))  C difficile quick scan w PCR reflex     Status: None   Collection Time: 12/08/15  3:02 PM  Result Value Ref Range Status   C Diff antigen NEGATIVE NEGATIVE Final   C Diff toxin NEGATIVE NEGATIVE Final   C Diff interpretation Negative for toxigenic C. difficile  Final  AFB culture with smear     Status: None (Preliminary result)   Collection Time: 12/10/15 11:33 AM  Result Value Ref Range Status   Specimen Description BRONCHIAL ALVEOLAR LAVAGE  Final   Special Requests Immunocompromised  Final   Acid Fast Smear   Final    NO ACID FAST BACILLI SEEN Performed at [facility]    Culture   Final    CULTURE WILL BE EXAMINED FOR 6 WEEKS BEFORE ISSUING A FINAL REPORT Performed at [facility]    Report Status PENDING  Incomplete  Culture, bal-quantitative     Status: None   Collection Time: 12/10/15 11:35 AM  Result Value Ref Range Status   Specimen Description BRONCHIAL ALVEOLAR LAVAGE  Final   Special Requests Immunocompromised  Final   Gram Stain   Final    MODERATE WBC PRESENT, PREDOMINANTLY MONONUCLEAR RARE SQUAMOUS EPITHELIAL CELLS PRESENT NO ORGANISMS SEEN Performed at [facility]   Final    35,000 COLONIES/ML Performed at [facility]    Culture   Final    NORMAL OROPHARYNGEAL FLORA Performed at [facility]    Report Status 12/12/2015 FINAL  Final  Fungus Culture with Smear     Status: None (Preliminary result)   Collection Time: 12/10/15 11:38 AM  Result Value Ref Range Status   Specimen Description BRONCHIAL ALVEOLAR LAVAGE  Final   Special Requests Immunocompromised  Final   Fungal Smear   Final    NO YEAST OR FUNGAL ELEMENTS SEEN Performed at [facility]    Culture   Final    CULTURE IN PROGRESS FOR FOUR WEEKS Performed at [facility]    Report Status PENDING  Incomplete  Pneumocystis smear by DFA     Status: None   Collection Time: 12/10/15 11:39 AM  Result Value Ref Range Status   Specimen Source-PJSRC BRONCHIAL ALVEOLAR LAVAGE  Final   Pneumocystis jiroveci Ag NEGATIVE  Final    Comment: Performed at [facility]  Tissue culture     Status: None (Preliminary result)   Collection Time: 12/10/15 11:40 AM  Result Value Ref Range Status   Specimen Description BIOPSY LUNG TRANSBRONCHIAL  Final   Special Requests Immunocompromised  Final   Gram Stain   Final    NO WBC SEEN NO ORGANISMS SEEN Performed at [facility]    Culture   Final    NO GROWTH 2 DAYS Note: Gram Stain Report Called to,Read Back By and Verified With: [facility] TORRES 12/10/15 @ 0707P YIMSU Performed at [facility]    Report Status PENDING  Incomplete  MRSA PCR  Screening     Status: None   Collection Time: 12/12/15  9:46 AM  Result Value Ref Range Status   MRSA by PCR NEGATIVE NEGATIVE Final    Comment:        The GeneXpert MRSA Assay (FDA approved for NASAL specimens only), is one component of a comprehensive MRSA colonization surveillance program. It is not intended to diagnose MRSA infection nor to guide or monitor treatment for MRSA infections.     Medical History: Past Medical History  Diagnosis Date  . 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 Diverticular disease   . Hyperlipidemia   . Obesity   . Hypertension   . Hiatal hernia   . CMV (cytomegalovirus) antibody positive     pre-transplant, with seroconversion x2 pst-transplant  . Right bundle branch block     pre-transplant  . CKD (chronic kidney disease) stage 2, GFR 60-89 ml/min   . Pancytopenia ([place])   . Atrial fibrillation ([place])     post-transplant  . Myopathy   . 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 Chronic GVHD complicating bone marrow transplantation ([place]) 12/05/2012  . Diarrhea in adult patient 12/05/2012    Due to active GVHD  . Rash of face 12/05/2012    Due to GVHD  . Hypomagnesemia 01/26/2013  . Left hip pain 12/01/2013  . Steroid-induced diabetes ([place])     novalog  . Leukemia, chronic lymphoid ([place])   . CLL (chronic lymphocytic leukemia) ([place]) 12/05/2012    Dx 07/1999; started Rx 12/06  AlloBMT 3/13   Assessment: 55 YOM known to pharmacy for levofloxacin dosing.  Patient has history of CLL, past hx of CMV reactivation, chronic GVHD admitted for shortness of breath found to have worsening bilateral infiltrate over a course of 3 days. Infiltrates could be infectious such as atypical infections with PCP, aspergillus, or CMV related or possibly a presentation of GVHD.  Antibiotics 12/17 >> Septra >> 12/20  12/20 >> levaquin >>  12/21  >> ganciclovir  Cultures:  12/19 BAL culture: Normal flora  12/19 BAL tissue culture: NGTD  12/19 BAL AFB: none  seen, culture IP  12/19 CMV culture: pending  12/19 BAL fungal culture: no yeast seen, culture IP  12/19 BAL legionella culture:  12/19 BAL PCP Ag: neg  12/18 CMV DNA: pending  12/17 Aspergillus: pending  12/17 CMV IgM: Negative  12/17 CMV IgG: 1.3 (increased)  12/17 CMV DNA: IP  12/17 CDiff negative  12/16 Influenza: Negative   Renal: Scr worsening, est CrCl ~99ml/min  Goal of Therapy:  Dose for indication and for patient-specific parameters  Plan:   Based on current est CrCl, adjust ganciclovir to 1.25mg /kg q24h (125mg  q24h)  Doreene Eland, PharmD, BCPS.   Pager: [phone] 12/12/2015 2:25 PM

## 2015-12-12 NOTE — Progress Notes (Signed)
Notified Ms. Miyashiro that patient was being transferred to stepdown for closer monitoring of respiratory status.

## 2015-12-12 NOTE — Progress Notes (Signed)
Patient called front desk stating he was short of breath, upon entering room RN noted patient to be markedly short of breath, O2 sats initially 77-79%.  Repositioned patient in bed and assessed lung sounds.  Noted some crackles in bases and left upper lobe.  No coughing or frothy sputum noted.  With repositioning sats did come up to 85% on 6 liters, rapid response called for further evaluation.  Morphine 1 mg given Iv push to help relax respiratory muscles.

## 2015-12-12 NOTE — Significant Event (Signed)
Rapid Response Event Note  Overview:  RRT called for shortness of breath.     Initial Focused Assessment: Patient sitting up in bed c/o SOB.    Interventions: At 0755, 87% on 6 L [place] with RR 35, HR 72. Increased oxygen to 14 L venti mask and O2 88% at 0757. At 0758, oxygen increased to partial RB and patients O2 sat increased to 92%, HR 67 and RR 33.  Event Summary: Name of Physician Notified: Magrinat at 0800  Name of Consulting Physician Notified: PCCM consulting and notified of transfer at 0800  Outcome: Transferred (Comment) (transfer to SDU room 1222)  Event End Time: 0830  Jaynie Bream

## 2015-12-12 NOTE — Progress Notes (Signed)
[facility]   Reason for visit: Follow up on hypoxia  Interval History: Has remained afebrile, tachypneic, hypoxic.  Now in ICU on Bipap,  Feels worse.  PCP DFA negative.  Gram stain with WBCs.  He did have a recent viral illness about 3 weeks ago.    Physical Exam: Constitutional:  Filed Vitals:   12/12/15 1157 12/12/15 1230  BP:  127/77  Pulse:    Temp: 97.3 F (36.3 C)   Resp:     patient appears in NAD Respiratory: increased respiratory effort; CTA B, on bipap Cardiovascular: Tachy RR GI: soft, nt  Review of Systems: Constitutional: negative for fevers and chills Musculoskeletal: negative for myalgias and arthralgias  Lab Results  Component Value Date   WBC 3.0* 12/12/2015   HGB 9.5* 12/12/2015   HCT 28.3* 12/12/2015   MCV 89.3 12/12/2015   PLT 106* 12/12/2015    Lab Results  Component Value Date   CREATININE 3.78* 12/12/2015   BUN 91* 12/12/2015   NA 136 12/12/2015   K 5.1 12/12/2015   CL 107 12/12/2015   CO2 17* 12/12/2015    Lab Results  Component Value Date   ALT 17 12/12/2015   AST 22 12/12/2015   ALKPHOS 66 12/12/2015     Microbiology: Recent Results (from the past 240 hour(s))  C difficile quick scan w PCR reflex     Status: None   Collection Time: 12/08/15  3:02 PM  Result Value Ref Range Status   C Diff antigen NEGATIVE NEGATIVE Final   C Diff toxin NEGATIVE NEGATIVE Final   C Diff interpretation Negative for toxigenic C. difficile  Final  AFB culture with smear     Status: None (Preliminary result)   Collection Time: 12/10/15 11:33 AM  Result Value Ref Range Status   Specimen Description BRONCHIAL ALVEOLAR LAVAGE  Final   Special Requests Immunocompromised  Final   Acid Fast Smear   Final    NO ACID FAST BACILLI SEEN Performed at [facility]    Culture   Final    CULTURE WILL BE EXAMINED FOR 6 WEEKS BEFORE ISSUING A FINAL REPORT Performed at [facility]    Report Status PENDING   Incomplete  Culture, bal-quantitative     Status: None   Collection Time: 12/10/15 11:35 AM  Result Value Ref Range Status   Specimen Description BRONCHIAL ALVEOLAR LAVAGE  Final   Special Requests Immunocompromised  Final   Gram Stain   Final    MODERATE WBC PRESENT, PREDOMINANTLY MONONUCLEAR RARE SQUAMOUS EPITHELIAL CELLS PRESENT NO ORGANISMS SEEN Performed at [facility]   Final    35,000 COLONIES/ML Performed at [facility]    Culture   Final    NORMAL OROPHARYNGEAL FLORA Performed at [facility]    Report Status 12/12/2015 FINAL  Final  Fungus Culture with Smear     Status: None (Preliminary result)   Collection Time: 12/10/15 11:38 AM  Result Value Ref Range Status   Specimen Description BRONCHIAL ALVEOLAR LAVAGE  Final   Special Requests Immunocompromised  Final   Fungal Smear   Final    NO YEAST OR FUNGAL ELEMENTS SEEN Performed at [facility]    Culture   Final    CULTURE IN PROGRESS FOR FOUR WEEKS Performed at [facility]    Report Status PENDING  Incomplete  Pneumocystis smear by DFA     Status: None  Collection Time: 12/10/15 11:39 AM  Result Value Ref Range Status   Specimen Source-PJSRC BRONCHIAL ALVEOLAR LAVAGE  Final   Pneumocystis jiroveci Ag NEGATIVE  Final    Comment: Performed at [facility]  Tissue culture     Status: None (Preliminary result)   Collection Time: 12/10/15 11:40 AM  Result Value Ref Range Status   Specimen Description BIOPSY LUNG TRANSBRONCHIAL  Final   Special Requests Immunocompromised  Final   Gram Stain   Final    NO WBC SEEN NO ORGANISMS SEEN Performed at [facility]    Culture   Final    NO GROWTH 2 DAYS Note: Gram Stain Report Called to,Read Back By and Verified With: [facility] TORRES 12/10/15 @ 0707P YIMSU Performed at [facility]    Report Status PENDING  Incomplete  MRSA PCR Screening     Status: None   Collection Time:  12/12/15  9:46 AM  Result Value Ref Range Status   MRSA by PCR NEGATIVE NEGATIVE Final    Comment:        The GeneXpert MRSA Assay (FDA approved for NASAL specimens only), is one component of a comprehensive MRSA colonization surveillance program. It is not intended to diagnose MRSA infection nor to guide or monitor treatment for MRSA infections.     Impression/Plan:  1. Airspace disease - differential broad with immunosuppression. Likely infection, viral vs bacterial vs atypical.  Opacity is better but overal bilateral worse. I agree with Dr. Jana Hakim and have started ganciclovir for broader coverage.  Renally adjusted per pharmacy.  CMV tests pending.  Follow leukocytes. 2.  CKD - slight worsening creat again today.

## 2015-12-12 NOTE — Progress Notes (Signed)
Subjective: Interval History: has complaints more SOB.  Objective: Vital signs in last 24 hours: Temp:  [97.8 F (36.6 C)-98.9 F (37.2 C)] 97.8 F (36.6 C) (12/21 0822) Pulse Rate:  [66-73] 66 (12/21 0932) Resp:  [20-30] 26 (12/21 0932) BP: (112-148)/(58-73) 145/70 mmHg (12/21 0932) SpO2:  [88 %-98 %] 98 % (12/21 0932) FiO2 (%):  [60 %] 60 % (12/21 0932) Weight:  [98.3 kg (216 lb 11.4 oz)-99.9 kg (220 lb 3.8 oz)] 99.9 kg (220 lb 3.8 oz) (12/21 [id number]) Weight change:   Intake/Output from previous day: 12/20 0701 - 12/21 0700 In: 2772.3 [P.O.:591; I.V.:1821.3; IV Piggyback:360] Out: 201 [Urine:200; Stool:1] Intake/Output this shift:    General appearance: alert, cooperative and on BIPAP Resp: diminished breath sounds bilaterally, rhonchi bilaterally and wheezes bilaterally Cardio: S1, S2 normal and systolic murmur: holosystolic 2/6, blowing at apex GI: protub, pos bs, soft Extremities: edema 2+  Lab Results:  Recent Labs  12/11/15 0536 12/12/15 0550  WBC 3.2* 3.0*  HGB 10.9* 9.5*  HCT 32.7* 28.3*  PLT 116* 106*   BMET:  Recent Labs  12/11/15 0536 12/12/15 0550  NA 139 136  K 4.9 5.1  CL 112* 107  CO2 15* 17*  GLUCOSE 174* 346*  BUN 82* 91*  CREATININE 3.31* 3.78*  CALCIUM 7.8* 7.9*   No results for input(s): PTH in the last 72 hours. Iron Studies: No results for input(s): IRON, TIBC, TRANSFERRIN, FERRITIN in the last 72 hours.  Studies/Results: Dg Chest 2 View  12/11/2015  CLINICAL DATA:  Weakness and shortness of breath today. History of pneumonia and leukemia. EXAM: CHEST  2 VIEW COMPARISON:  12/10/2015 FINDINGS: Right IJ Port-A-Cath unchanged. Lungs are somewhat hypoinflated and demonstrate continued airspace opacification over the right lobe and posterior lower lobes with slight interval improvement. Stable hazy opacification over the left mid upper lung. Small amount of bilateral pleural fluid posteriorly. Stable cardiomegaly. Remainder the exam is  unchanged. IMPRESSION: Hypoinflation with multifocal airspace process with slight interval improvement likely multifocal pneumonia. Small amount of posterior pleural fluid. Right IJ Port-A-Cath unchanged. Electronically Signed   By: Marin Olp M.D.   On: 12/11/2015 11:51   Dg Chest 2 View  12/10/2015  CLINICAL DATA:  Shortness of breath. History of chronic lymphocytic leukemia EXAM: CHEST  2 VIEW COMPARISON:  Study obtained earlier in the day and chest CT December 06, 2015 FINDINGS: There is extensive airspace consolidation in the right lower lobe. There is more patchy infiltrate throughout much of the left upper lobe and to a lesser extent right upper lobe, stable. Compared to earlier in the day, there is new consolidation lateral left base. Heart is upper normal in size with pulmonary vascular within normal limits. Port-A-Cath tip is in the superior vena cava. No adenopathy appreciable. Multiple compression fractures in the thoracic spine appear stable. IMPRESSION: Multifocal pneumonia with new infiltrate in the lateral left base compared to earlier in the day. Other areas of infiltrate appears stable. No change in cardiac silhouette. Electronically Signed   By: Lowella Grip III M.D.   On: 12/10/2015 20:29   Dg Chest Port 1 View  12/12/2015  CLINICAL DATA:  Shortness of breath. Weakness. History of pneumonia and the [place]. EXAM: PORTABLE CHEST 1 VIEW COMPARISON:  12/11/2015 and previous FINDINGS: Pulmonary infiltrate in the right lower lobe is improving. However, there is worsened interstitial density throughout the lungs in general, particularly the upper lobes. This could go along with developing edema or spreading of the pneumonia. No visible  effusion. Port-A-Cath on the right is unchanged. IMPRESSION: Continued improvement of the right lower lobe infiltrate. Worsening of generalized interstitial lung density, upper lobe predominant. Differential diagnosis is developing interstitial edema/ fluid  overload versus generalized worsening of pneumonia. Electronically Signed   By: Nelson Chimes M.D.   On: 12/12/2015 08:20    I have reviewed the patient's current medications.  Assessment/Plan: 1 AKI/CKD ??? I&O, not kept.  Cr within recent range, vol xs will change to iv furosemide and follow.  CVP would be helpful 2 Resp failure per CCM 3 Pulm infilt ?? etio 4 BMT 5 Tremor P stop dilt, ^ lasix, follow vol, cont AB,      LOS: 6 days   Elic Vencill L 12/12/2015,10:56 AM

## 2015-12-12 NOTE — Progress Notes (Signed)
Inpatient Diabetes Program Recommendations  AACE/ADA: New Consensus Statement on Inpatient Glycemic Control (2015)  Target Ranges:  Prepandial:   less than 140 mg/dL      Peak postprandial:   less than 180 mg/dL (1-2 hours)      Critically ill patients:  140 - 180 mg/dL   Review of Glycemic Control  Results for KATRELL, SUBLER (MRN [id number]) as of 12/12/2015 09:01  Ref. Range 12/10/2015 04:30 12/11/2015 05:36 12/12/2015 05:50  Glucose Latest Ref Range: 65-99 mg/dL 181 (H) 174 (H) 346 (H)   FBS elevated. May benefit from small amount of basal insulin.  Inpatient Diabetes Program Recommendations:  Consider addition of Levemir 8 units QHS.  Will continue to follow. Thank you. Lorenda Peck, RD, LDN, CDE Inpatient Diabetes Coordinator [phone]

## 2015-12-12 NOTE — Progress Notes (Signed)
Name: Timothy [name] MRN: [id number] DOB: 12-Sep-1946    ADMISSION DATE:  12/06/2015 CONSULTATION DATE:  12/07/15  REFERRING MD :  Dr. 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    SIGNIFICANT EVENTS  12/15  Admit from [facility] with multiple c/o's - more diarrhea, SOB, slurred speech, low blood sugars 12/19 FOB>>>  MICRO:  BAL 12/19>>> normal flora  AFB 12/19>>> smear NEG  PCP 12/19>>> NEG  CMV 12/19>>> Legionella 12/19>>> FOB tissue culture 12/19>>>  STUDIES:  12/15  CT Chest >> bilateral airspace & ground glass opacities, atherosclerosis in coronary arteries, cardiomegaly, small R pleural effusion, non-specific gallbladder distention, trace perihepatic ascites  12/15  ECHO >> LV with mod to severe LVH, mild reduction of systolic fxn, EF [id number], No RWMA, grade 1 diastolic dysfunction, mild mitral regurg, mild aortic regurg 12/16  VQ >> LOW prob PE  ABX:  Bactrim 12/17>>>12/20 Acyclovir 12/15>>> Diflucan 12/15>>> Levaquin 12/20>>>   SUBJECTIVE:  tx to ICU this am with increased SOB, hypoxia requiring NRB.   VITAL SIGNS: Temp:  [97.8 F (36.6 C)-98.9 F (37.2 C)] 97.8 F (36.6 C) (12/21 0822) Pulse Rate:  [68-73] 68 (12/21 0822) Resp:  [20-30] 30 (12/21 0822) BP: (112-148)/(58-73) 148/71 mmHg (12/21 0822) SpO2:  [88 %-93 %] 93 % (12/21 0822) Weight:  [216 lb 11.4 oz (98.3 kg)-220 lb 3.8 oz (99.9 kg)] 220 lb 3.8 oz (99.9 kg) (12/21 [id number])  PHYSICAL EXAMINATION: General:  Chronically ill appearing male, tachypneic on NRB but NAD  Neuro:  AAOx4, speech clear, MAE HEENT:  MM pink/moist, no jvd  Cardiovascular:  s1s2 regular, distant tones, palpable peripheral pulses  Lungs:  Even/non-labored but tachypneic,  bilat crackles, few exp wheeze   Abdomen:  Round/soft, bsx4 active, non-tender  Musculoskeletal:  Warm and dry, 2+ BLE edema    Recent Labs Lab 12/10/15 0430 12/11/15 0536 12/12/15 0550  NA 141 139 136  K 4.5 4.9 5.1  CL 115* 112* 107  CO2 18* 15* 17*  BUN 83* 82* 91*  CREATININE 3.17* 3.31* 3.78*  GLUCOSE 181* 174* 346*    Recent Labs Lab 12/10/15 0430 12/11/15 0536 12/12/15 0550  HGB 11.8* 10.9* 9.5*  HCT 35.1* 32.7* 28.3*  WBC 5.7 3.2* 3.0*  PLT 118* 116* 106*   Dg Chest 2 View  12/11/2015  CLINICAL DATA:  Weakness and shortness of breath today. History of pneumonia and leukemia. EXAM: CHEST  2 VIEW COMPARISON:  12/10/2015 FINDINGS: Right IJ Port-A-Cath unchanged. Lungs are somewhat hypoinflated and demonstrate continued airspace opacification over the right lobe and posterior lower lobes with slight interval improvement. Stable hazy opacification over the left mid upper lung. Small amount of bilateral pleural fluid posteriorly. Stable cardiomegaly. Remainder the exam is unchanged. IMPRESSION: Hypoinflation with multifocal airspace process with slight interval improvement likely multifocal pneumonia. Small amount of posterior pleural fluid. Right IJ Port-A-Cath unchanged. Electronically Signed   By: Marin Olp M.D.   On: 12/11/2015 11:51   Dg Chest 2 View  12/10/2015  CLINICAL DATA:  Shortness of breath. History of chronic lymphocytic leukemia EXAM: CHEST  2 VIEW COMPARISON:  Study obtained earlier in the day and chest CT December 06, 2015 FINDINGS: There is extensive airspace consolidation in the right lower lobe. There is more patchy infiltrate throughout much  of the left upper lobe and to a lesser extent right upper lobe, stable. Compared to earlier in the day, there is new consolidation lateral left base. Heart is upper normal in size with pulmonary vascular within normal limits. Port-A-Cath tip is in the superior vena cava. No adenopathy appreciable. Multiple compression  fractures in the thoracic spine appear stable. IMPRESSION: Multifocal pneumonia with new infiltrate in the lateral left base compared to earlier in the day. Other areas of infiltrate appears stable. No change in cardiac silhouette. Electronically Signed   By: Lowella Grip III M.D.   On: 12/10/2015 20:29   Dg Chest Port 1 View  12/12/2015  CLINICAL DATA:  Shortness of breath. Weakness. History of pneumonia and the [place]. EXAM: PORTABLE CHEST 1 VIEW COMPARISON:  12/11/2015 and previous FINDINGS: Pulmonary infiltrate in the right lower lobe is improving. However, there is worsened interstitial density throughout the lungs in general, particularly the upper lobes. This could go along with developing edema or spreading of the pneumonia. No visible effusion. Port-A-Cath on the right is unchanged. IMPRESSION: Continued improvement of the right lower lobe infiltrate. Worsening of generalized interstitial lung density, upper lobe predominant. Differential diagnosis is developing interstitial edema/ fluid overload versus generalized worsening of pneumonia. Electronically Signed   By: Nelson Chimes M.D.   On: 12/12/2015 08:20   Dg Chest Port 1 View  12/10/2015  CLINICAL DATA:  69 year old male post bronchoscopy right middle lobe. Subsequent encounter. EXAM: PORTABLE CHEST 1 VIEW COMPARISON:  12/08/2015. FINDINGS: Curvilinear structure right lung apex probably pleural reflection associated with rib rather than tiny pneumothorax. Attention to this on follow up. Asymmetric airspace disease greater on the right. When compared to the most recent chest x-ray, decrease in degree of consolidation right lung apex and slight increase in left perihilar consolidation. Pulmonary vascular congestion. Right MediPort catheter tip mid superior vena cava level. Heart size top-normal to slightly enlarged. IMPRESSION: Curvilinear structure right lung apex probably pleural reflection associated with rib rather than tiny pneumothorax.  Attention to this on follow up. Asymmetric airspace disease greater on the right. When compared to the most recent chest x-ray, decrease in degree of consolidation right lung apex and slight increase in left perihilar consolidation. Pulmonary vascular congestion. Electronically Signed   By: Genia Del M.D.   On: 12/10/2015 10:26     ASSESSMENT / PLAN:  PULMONARY Bilateral Infiltrates / Ground Glass Opacities - differential broad in immunocompromised host - likely infectious - viral v bacterial v atypical.  DFA for PCP neg and bactrim stopped 12/20.   includes bacterial / viral illness, high risk for opportunistic infection.  S/p FOB 12/19.  Worsening infiltrates 12/21.  Small Right Pleural Effusion  Acute Hypoxic Respiratory Failure -  Worsening hypoxia 12/21, tx ICU.  Plan: abx per ID as above  PRN bipap  Additional lasix x1 12/21  F/u CXR  pulm hygiene as able  F/u path, FOB cultures  Cont solumedrol   CARDIOVASCULAR Hx HTN  AFib  P:  Cont PO cardizem, labetalol  Cont lasix  KVO IVF  RENAL CKD II - Scr trending up.  Hypomagnesemia  P:   F/u chem  Caution with lasix  Now off bactrim  KVO HCO3 gtt   GASTROINTESTINAL No active issue  P:   PO diet as tol - NPO if requires bipap   HEMATOLOGIC CLL  Chronic Graft vs Host Disease  P:  Continue baseline prednisone 10mg  QD, acyclovir, diflucan Plan per Dr. Jana Hakim    INFECTIOUS Bilateral  pulmonary infiltrates - presumed infectious  P:   abx as above per ID  Follow FOB cultures   ENDOCRINE Relative adrenal insufficiency Hyperglycemia  P:   Cont steroids  SSI   NEUROLOGIC Hx depression, anxiety  Chronic pain  P:   PRN morphine  Cont zoloft, PRN ativan    FAMILY  - Updates:  Pt updated at length 12/21.  He WOULD want to be placed on mech vent if he needed it.  He will update family on his tx to ICU.      Nickolas Madrid, NP 12/12/2015  9:12 AM Pager: [phone] or [phone]

## 2015-12-12 NOTE — Progress Notes (Signed)
Timothy [name]   DOB:[birth year]   VE#:[id number]   WCH#:[id number]  Subjective: Nishan was more SOB this AM and more hypoxic despite O2, was transferred to stepdown; has been on BiPap most of the day; he dos=es not like it but feels better using it, so he is agreeable to continue; he understnads he is at risk of intubation if he becomes too fatigued. One small BM today; wife in room  Objective: middle aged White male examined in bed  Filed Vitals:   12/12/15 1600 12/12/15 1800  BP: 113/81 127/70  Pulse: 64 66  Temp: 97.6 F (36.4 C)   Resp: 27 29    Body mass index is 34.49 kg/(m^2).  Intake/Output Summary (Last 24 hours) at 12/12/15 1851 Last data filed at 12/12/15 1700  Gross per 24 hour  Intake   2554 ml  Output    576 ml  Net   1978 ml     No rash, no oral lesions to suggest GVH as culprit (lack of diarrhea also c/w this)  CBG (last 3)   Recent Labs  12/12/15 0903 12/12/15 1153 12/12/15 1633  GLUCAP 342* 260* 158*     Labs:  Lab Results  Component Value Date   WBC 3.0* 12/12/2015   HGB 9.5* 12/12/2015   HCT 28.3* 12/12/2015   MCV 89.3 12/12/2015   PLT 106* 12/12/2015   NEUTROABS 4.4 12/06/2015    '@LASTCHEMISTRY'$ @  Urine Studies No results for input(s): UHGB, CRYS in the last 72 hours.  Invalid input(s): UACOL, UAPR, USPG, UPH, UTP, UGL, UKET, UBIL, UNIT, UROB, [place], UEPI, UWBC, Junie Panning [place], [place], [place]  Basic Metabolic Panel:  Recent Labs Lab 12/06/15 1139 12/06/15 1440  12/08/15 0440 12/09/15 0510 12/10/15 0430 12/11/15 0536 12/12/15 0550  NA  --  141  < > 144 141 141 139 136  K  --  4.8  < > 4.6 4.4 4.5 4.9 5.1  CL  --  118*  < > 122* 116* 115* 112* 107  CO2  --  12*  < > 12* 14* 18* 15* 17*  GLUCOSE  --  151*  < > 186* 161* 181* 174* 346*  BUN  --  82*  < > 75* 79* 83* 82* 91*  CREATININE  --  3.77*  < > 3.22* 3.09* 3.17* 3.31* 3.78*  CALCIUM  --  5.8*  < > 7.6* 7.4* 7.7* 7.8* 7.9*  MG 1.4* 1.3*  --  1.9  --   --   --   --   PHOS  --   5.7*  --   --   --   --  4.3 4.7*  < > = values in this interval not displayed. GFR Estimated Creatinine Clearance: 20.8 mL/min (by C-G formula based on Cr of 3.78). Liver Function Tests:  Recent Labs Lab 12/08/15 0440 12/09/15 0510 12/10/15 0430 12/11/15 0536 12/12/15 0550  AST '28 22 22 21 22  '$ ALT '23 20 19 17 17  '$ ALKPHOS 73 74 81 70 66  BILITOT 0.4 0.4 0.4 0.6 0.4  PROT 5.0* 5.4* 5.5* 4.8* 6.1*  ALBUMIN 2.9* 3.0* 2.9* 2.6* 2.4*   No results for input(s): LIPASE, AMYLASE in the last 168 hours. No results for input(s): AMMONIA in the last 168 hours. Coagulation profile  Recent Labs Lab 12/10/15 0745  INR 1.21    CBC:  Recent Labs Lab 12/06/15 1138  12/06/15 1440 12/08/15 0440 12/09/15 0510 12/10/15 0430 12/11/15 0536 12/12/15 0550  WBC 6.7  < >  5.4 5.7 5.0 5.7 3.2* 3.0*  NEUTROABS 5.7  --  4.4  --   --   --   --   --   HGB 13.1  < > 12.1* 11.4* 11.4* 11.8* 10.9* 9.5*  HCT 39.4  < > 36.5* 34.9* 35.5* 35.1* 32.7* 28.3*  MCV 90.6  < > 91.5 91.6 90.8 90.2 90.3 89.3  PLT 107*  < > 120* 114* 129* 118* 116* 106*  < > = values in this interval not displayed. Cardiac Enzymes:  Recent Labs Lab 12/06/15 1440 12/06/15 2150 12/07/15 0200 12/07/15 1010 12/08/15 0440  CKTOTAL  --   --   --  102  --   CKMB  --   --   --  10.5*  --   TROPONINI 0.04* 0.05* 0.05*  --  0.05*   BNP: Invalid input(s): POCBNP CBG:  Recent Labs Lab 12/11/15 2202 12/12/15 0741 12/12/15 0903 12/12/15 1153 12/12/15 1633  GLUCAP 169* 299* 342* 260* 158*   D-Dimer No results for input(s): DDIMER in the last 72 hours. Hgb A1c No results for input(s): HGBA1C in the last 72 hours. Lipid Profile No results for input(s): CHOL, HDL, LDLCALC, TRIG, CHOLHDL, LDLDIRECT in the last 72 hours. Thyroid function studies No results for input(s): TSH, T4TOTAL, T3FREE, THYROIDAB in the last 72 hours.  Invalid input(s): FREET3 Anemia work up No results for input(s): VITAMINB12, FOLATE, FERRITIN,  TIBC, IRON, RETICCTPCT in the last 72 hours. Microbiology Recent Results (from the past 240 hour(s))  C difficile quick scan w PCR reflex     Status: None   Collection Time: 12/08/15  3:02 PM  Result Value Ref Range Status   C Diff antigen NEGATIVE NEGATIVE Final   C Diff toxin NEGATIVE NEGATIVE Final   C Diff interpretation Negative for toxigenic C. difficile  Final  AFB culture with smear     Status: None (Preliminary result)   Collection Time: 12/10/15 11:33 AM  Result Value Ref Range Status   Specimen Description BRONCHIAL ALVEOLAR LAVAGE  Final   Special Requests Immunocompromised  Final   Acid Fast Smear   Final    NO ACID FAST BACILLI SEEN Performed at [facility]    Culture   Final    CULTURE WILL BE EXAMINED FOR 6 WEEKS BEFORE ISSUING A FINAL REPORT Performed at [facility]    Report Status PENDING  Incomplete  Culture, bal-quantitative     Status: None   Collection Time: 12/10/15 11:35 AM  Result Value Ref Range Status   Specimen Description BRONCHIAL ALVEOLAR LAVAGE  Final   Special Requests Immunocompromised  Final   Gram Stain   Final    MODERATE WBC PRESENT, PREDOMINANTLY MONONUCLEAR RARE SQUAMOUS EPITHELIAL CELLS PRESENT NO ORGANISMS SEEN Performed at [facility]   Final    35,000 COLONIES/ML Performed at [facility]    Culture   Final    NORMAL OROPHARYNGEAL FLORA Performed at [facility]    Report Status 12/12/2015 FINAL  Final  Fungus Culture with Smear     Status: None (Preliminary result)   Collection Time: 12/10/15 11:38 AM  Result Value Ref Range Status   Specimen Description BRONCHIAL ALVEOLAR LAVAGE  Final   Special Requests Immunocompromised  Final   Fungal Smear   Final    NO YEAST OR FUNGAL ELEMENTS SEEN Performed at [facility]    Culture   Final    CULTURE IN PROGRESS FOR  FOUR WEEKS Performed at [facility]    Report Status PENDING  Incomplete   Pneumocystis smear by DFA     Status: None   Collection Time: 12/10/15 11:39 AM  Result Value Ref Range Status   Specimen Source-PJSRC BRONCHIAL ALVEOLAR LAVAGE  Final   Pneumocystis jiroveci Ag NEGATIVE  Final    Comment: Performed at [facility]  Tissue culture     Status: None (Preliminary result)   Collection Time: 12/10/15 11:40 AM  Result Value Ref Range Status   Specimen Description BIOPSY LUNG TRANSBRONCHIAL  Final   Special Requests Immunocompromised  Final   Gram Stain   Final    NO WBC SEEN NO ORGANISMS SEEN Performed at [facility]    Culture   Final    NO GROWTH 2 DAYS Note: Gram Stain Report Called to,Read Back By and Verified With: [facility] TORRES 12/10/15 @ 0707P YIMSU Performed at [facility]    Report Status PENDING  Incomplete  MRSA PCR Screening     Status: None   Collection Time: 12/12/15  9:46 AM  Result Value Ref Range Status   MRSA by PCR NEGATIVE NEGATIVE Final    Comment:        The GeneXpert MRSA Assay (FDA approved for NASAL specimens only), is one component of a comprehensive MRSA colonization surveillance program. It is not intended to diagnose MRSA infection nor to guide or monitor treatment for MRSA infections.     Studies:  Dg Chest 2 View  12/11/2015  CLINICAL DATA:  Weakness and shortness of breath today. History of pneumonia and leukemia. EXAM: CHEST  2 VIEW COMPARISON:  12/10/2015 FINDINGS: Right IJ Port-A-Cath unchanged. Lungs are somewhat hypoinflated and demonstrate continued airspace opacification over the right lobe and posterior lower lobes with slight interval improvement. Stable hazy opacification over the left mid upper lung. Small amount of bilateral pleural fluid posteriorly. Stable cardiomegaly. Remainder the exam is unchanged. IMPRESSION: Hypoinflation with multifocal airspace process with slight interval improvement likely multifocal pneumonia. Small amount of posterior pleural fluid. Right  IJ Port-A-Cath unchanged. Electronically Signed   By: Marin Olp M.D.   On: 12/11/2015 11:51   Dg Chest Port 1 View  12/12/2015  CLINICAL DATA:  Shortness of breath. Weakness. History of pneumonia and the [place]. EXAM: PORTABLE CHEST 1 VIEW COMPARISON:  12/11/2015 and previous FINDINGS: Pulmonary infiltrate in the right lower lobe is improving. However, there is worsened interstitial density throughout the lungs in general, particularly the upper lobes. This could go along with developing edema or spreading of the pneumonia. No visible effusion. Port-A-Cath on the right is unchanged. IMPRESSION: Continued improvement of the right lower lobe infiltrate. Worsening of generalized interstitial lung density, upper lobe predominant. Differential diagnosis is developing interstitial edema/ fluid overload versus generalized worsening of pneumonia. Electronically Signed   By: Nelson Chimes M.D.   On: 12/12/2015 08:20    Assessment: 69 y.o. [place] man with a history of well-differentiated lymphocytic lymphoma/ chronic lymphoid leukemia initially diagnosed in 2000, not requiring intervention until 2006; s/p allogeneic transplant March 2013, now admitted with worsening SOB, bilateral pulmonary infiltrates and acute on chronic kidney injury, but no fever, mucositis, or rash. His CLL history is as follows:  (1) fludarabine/cyclophosphamide/rituximab x5 completed May 2007.   (2) rituximab for 8 doses October 2010, with partial response   (3) Leustatin and ofatumumab weekly x8 July to September 2011 followed by maintenance ofatumumab every 2 months, with initial response but  rising counts September 2012   (4) status-post unrelated donor stem-cell transplant 02/24/2012 at [facility]  (a) conditioning regimen consisted of fludarabine + TBI at 200 cGy, followed by rituximab x27;  (b) CMV reactivation x3 (patient CMV positive, donor negative), s/p ganciclovir treatment; 3d reactivation August 2013, s/p  gancyclovir, with negative PCR mid-September 2013; last gancyclovir dose 10/06/2012 (c) Chronic GVHD: involving gut and skin, treated with steroids, tacrolimus and MMF. MMF was eventually d/c'd and tacrolimus currently at a dose of 1.'5mg'$  BID (d) atrial fibrillation: resolved on brief amiodarone regimen (e) steroid-induced myopathy: improving  (f) hypomagnesemia: improved after d/c gancyclovir, needs continuing support (g) hypogammaglobulinemia: requiring IVIG most recently 08/03/2014. (h) history of elevated triglycerides (606 on 07/14/2012)  (i) adrenal insufficiency: on prednisone and budesonide (j) pancytopenia,resolved (k) brief episode of neutropenia ([place] 300) February 2015, accompanied by diarrhea; resolved   (5) restaging studies February-March 2015 including CT scans, flow cytometry, and bone marrow biopsy, showed no evidence of residual chronic lymphoid leukemia. (a) repeat bone marrow biopsy 02/27/2015 showed no evidence of chronic lymphoid leukemia and also no dyspoiesis. Flow cytometry showed no B cells.  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 Status post kyphoplasty at L3-4 in June 2014. Also with evidence of rib fractures and insufficiency fractures bilaterally of the sacral alae, noted by CT in March 2015. -- Denosumab started 12/08/2013, given as prolia Q6 months which is what has been approved by his insurance, most recent dose 03/29/2015  (12) chronic back pain  and hip pain controlled with OxyContin and hydrocodone/APAP.  (12) nausea: well controlled on current meds  (13) Positive c.diff, 03/08/2013, on Flagyl 500 mg TID x 20 days, then on oral vanco with Questran, showing improvement; positive when repeated April 2014; Negative x 3 since then; repeat 12/07/2015 again negative  (14) persistently increased BUN and potassium: Improved on daily bicarbonate  (15) Hypertension, on labetalol, cardizem, lisinopril, and furosemide; managed by Dr. Brigitte Pulse  (16) steroid induced hyperglycemia/ DM II: managed by Dr Brigitte Pulse; was severely hypoglycemic on admission, currently on SSI   (17) hypogammaglobulinemia-- requiring intermittent supplementation, most recent dose 10/18/2015  (18) squamous cell CA in situ removed from left parietal scalp October 2014  (19) severe SOB with pulmonary infiltrates: workup so far has included   (a) echo 12/06/2015 shows EF 45-50% and no wall motion abnormalities  (b) EKG shows new (c/w 2014) left fascicular block, old RBBB, no ischemic changes  (c) troponin is minimally and persistently elevated, not c/w MI  (d) V/Q scan 12/07/2015 low probability  (e) negative influenza panel  (f) .CMV IgG positive but IgM negative  (g) PCP screen from BAL negative  (h) IgG level <400-- replaced 12/11/2015    Plan:  Greatly appreciate PUL/ID/Renal consultants continuing help!  Today is day 3 of steroids, day 1 ganciclovir.   Not making much urine despite the diuretics. BUN/creat rising. Ca++ better, with continuing supplementation. Sugars now rising on high-dose steroids.  Quantitative CMV sent 12/18, pending, as are other ID tests.  I am hoping with steroids, antivirals and continuing support he may begin to turn the  corner next 24-48 hours.   Chauncey Cruel, MD 12/12/2015  6:51 PM Medical Oncology and Hematology [facility] [address] [place], [place] [address] Tel. [phone]    Fax. [phone]

## 2015-12-12 NOTE — Progress Notes (Signed)
RT assisted with PT evaluation (along with Rapid Response RN). PT Sp02 <87% on 6 LPM [place] when RT entered room. RT placed PT on 55% VM with no resolution. RT then placed PT on PRB- Sp02 increased to 96%. PT appeared to be in mild respiratory distress. PT had mild, fine, bilateral crackles in upper lobes, diminished in bases.   RT assisted with PT transport to [facility] ICU 1222- uneventful. RT reported to ICU RT able PT condition. RN was at bedside.

## 2015-12-13 ENCOUNTER — Inpatient Hospital Stay (HOSPITAL_COMMUNITY): Payer: BC Managed Care – PPO

## 2015-12-13 LAB — CBC
HEMATOCRIT: 28.3 % — AB (ref 39.0–52.0)
Hemoglobin: 9.8 g/dL — ABNORMAL LOW (ref 13.0–17.0)
MCH: 30.3 pg (ref 26.0–34.0)
MCHC: 34.6 g/dL (ref 30.0–36.0)
MCV: 87.6 fL (ref 78.0–100.0)
Platelets: 106 10*3/uL — ABNORMAL LOW (ref 150–400)
RBC: 3.23 MIL/uL — ABNORMAL LOW (ref 4.22–5.81)
RDW: 16.6 % — AB (ref 11.5–15.5)
WBC: 3.8 10*3/uL — ABNORMAL LOW (ref 4.0–10.5)

## 2015-12-13 LAB — COMPREHENSIVE METABOLIC PANEL
ALBUMIN: 2.4 g/dL — AB (ref 3.5–5.0)
ALT: 17 U/L (ref 17–63)
AST: 21 U/L (ref 15–41)
Alkaline Phosphatase: 68 U/L (ref 38–126)
Anion gap: 13 (ref 5–15)
BILIRUBIN TOTAL: 0.5 mg/dL (ref 0.3–1.2)
BUN: 111 mg/dL — AB (ref 6–20)
CHLORIDE: 107 mmol/L (ref 101–111)
CO2: 17 mmol/L — ABNORMAL LOW (ref 22–32)
Calcium: 7.4 mg/dL — ABNORMAL LOW (ref 8.9–10.3)
Creatinine, Ser: 3.65 mg/dL — ABNORMAL HIGH (ref 0.61–1.24)
GFR calc Af Amer: 18 mL/min — ABNORMAL LOW (ref >60)
GFR calc non Af Amer: 16 mL/min — ABNORMAL LOW (ref >60)
GLUCOSE: 205 mg/dL — AB (ref 65–99)
POTASSIUM: 4.8 mmol/L (ref 3.5–5.1)
Sodium: 137 mmol/L (ref 135–145)
Total Protein: 5.9 g/dL — ABNORMAL LOW (ref 6.5–8.1)

## 2015-12-13 LAB — GLUCOSE, CAPILLARY
GLUCOSE-CAPILLARY: 192 mg/dL — AB (ref 65–99)
GLUCOSE-CAPILLARY: 208 mg/dL — AB (ref 65–99)
GLUCOSE-CAPILLARY: 243 mg/dL — AB (ref 65–99)
Glucose-Capillary: 174 mg/dL — ABNORMAL HIGH (ref 65–99)
Glucose-Capillary: 199 mg/dL — ABNORMAL HIGH (ref 65–99)
Glucose-Capillary: 203 mg/dL — ABNORMAL HIGH (ref 65–99)
Glucose-Capillary: 221 mg/dL — ABNORMAL HIGH (ref 65–99)

## 2015-12-13 LAB — CMV DNA, QUANTITATIVE, PCR
CMV DNA QUANT: NEGATIVE [IU]/mL
CMV DNA QUANT: POSITIVE [IU]/mL
LOG10 CMV QN DNA PL: UNDETERMINED {Log_IU}/mL
Log10 CMV Qn DNA Pl: UNDETERMINED log10 IU/mL

## 2015-12-13 LAB — PHOSPHORUS: Phosphorus: 5 mg/dL — ABNORMAL HIGH (ref 2.5–4.6)

## 2015-12-13 MED ORDER — GLUCERNA SHAKE PO LIQD
237.0000 mL | Freq: Two times a day (BID) | ORAL | Status: DC
  Administered 2015-12-13 – 2015-12-25 (×13): 237 mL via ORAL
  Filled 2015-12-13 (×26): qty 237

## 2015-12-13 MED ORDER — LABETALOL HCL 300 MG PO TABS
300.0000 mg | ORAL_TABLET | Freq: Two times a day (BID) | ORAL | Status: DC
  Administered 2015-12-13 – 2015-12-28 (×29): 300 mg via ORAL
  Filled 2015-12-13 (×32): qty 1

## 2015-12-13 MED ORDER — INSULIN GLARGINE 100 UNIT/ML ~~LOC~~ SOLN
5.0000 [IU] | Freq: Every day | SUBCUTANEOUS | Status: DC
  Administered 2015-12-13 – 2015-12-14 (×2): 5 [IU] via SUBCUTANEOUS
  Filled 2015-12-13 (×2): qty 0.05

## 2015-12-13 MED ORDER — CETYLPYRIDINIUM CHLORIDE 0.05 % MT LIQD
7.0000 mL | Freq: Two times a day (BID) | OROMUCOSAL | Status: DC
  Administered 2015-12-13 – 2015-12-28 (×26): 7 mL via OROMUCOSAL

## 2015-12-13 NOTE — Progress Notes (Signed)
[facility] for Infectious Disease   Reason for visit: Follow up on hypoxia  Interval History: Has remained afebrile, tachypneic, hypoxic.  Off Bipap and feels a bit better.  CMV DNA in blood negative 12/18 and minimal 12/19.  No other new findings.    Physical Exam: Constitutional:  Filed Vitals:   12/13/15 1209 12/13/15 1400  BP:  132/69  Pulse:  74  Temp: 98.1 F (36.7 C)   Resp:  30   patient appears in NAD Respiratory: improved respiratory effort; CTA B, on venti mask Cardiovascular: Tachy RR GI: soft, nt  Review of Systems: Constitutional: negative for fevers and chills Musculoskeletal: negative for myalgias and arthralgias  Lab Results  Component Value Date   WBC 3.8* 12/13/2015   HGB 9.8* 12/13/2015   HCT 28.3* 12/13/2015   MCV 87.6 12/13/2015   PLT 106* 12/13/2015    Lab Results  Component Value Date   CREATININE 3.65* 12/13/2015   BUN 111* 12/13/2015   NA 137 12/13/2015   K 4.8 12/13/2015   CL 107 12/13/2015   CO2 17* 12/13/2015    Lab Results  Component Value Date   ALT 17 12/13/2015   AST 21 12/13/2015   ALKPHOS 68 12/13/2015     Microbiology: Recent Results (from the past 240 hour(s))  C difficile quick scan w PCR reflex     Status: None   Collection Time: 12/08/15  3:02 PM  Result Value Ref Range Status   C Diff antigen NEGATIVE NEGATIVE Final   C Diff toxin NEGATIVE NEGATIVE Final   C Diff interpretation Negative for toxigenic C. difficile  Final  AFB culture with smear     Status: None (Preliminary result)   Collection Time: 12/10/15 11:33 AM  Result Value Ref Range Status   Specimen Description BRONCHIAL ALVEOLAR LAVAGE  Final   Special Requests Immunocompromised  Final   Acid Fast Smear   Final    NO ACID FAST BACILLI SEEN Performed at [facility]    Culture   Final    CULTURE WILL BE EXAMINED FOR 6 WEEKS BEFORE ISSUING A FINAL REPORT Performed at [facility]    Report Status PENDING  Incomplete    Culture, bal-quantitative     Status: None   Collection Time: 12/10/15 11:35 AM  Result Value Ref Range Status   Specimen Description BRONCHIAL ALVEOLAR LAVAGE  Final   Special Requests Immunocompromised  Final   Gram Stain   Final    MODERATE WBC PRESENT, PREDOMINANTLY MONONUCLEAR RARE SQUAMOUS EPITHELIAL CELLS PRESENT NO ORGANISMS SEEN Performed at [place]   Final    35,000 COLONIES/ML Performed at [facility]    Culture   Final    NORMAL OROPHARYNGEAL FLORA Performed at [facility]    Report Status 12/12/2015 FINAL  Final  Fungus Culture with Smear     Status: None (Preliminary result)   Collection Time: 12/10/15 11:38 AM  Result Value Ref Range Status   Specimen Description BRONCHIAL ALVEOLAR LAVAGE  Final   Special Requests Immunocompromised  Final   Fungal Smear   Final    NO YEAST OR FUNGAL ELEMENTS SEEN Performed at [facility]    Culture   Final    CULTURE IN PROGRESS FOR FOUR WEEKS Performed at [facility]    Report Status PENDING  Incomplete  Pneumocystis smear by DFA     Status: None   Collection Time: 12/10/15  11:39 AM  Result Value Ref Range Status   Specimen Source-PJSRC BRONCHIAL ALVEOLAR LAVAGE  Final   Pneumocystis jiroveci Ag NEGATIVE  Final    Comment: Performed at [facility]  Tissue culture     Status: None (Preliminary result)   Collection Time: 12/10/15 11:40 AM  Result Value Ref Range Status   Specimen Description BIOPSY LUNG TRANSBRONCHIAL  Final   Special Requests Immunocompromised  Final   Gram Stain   Final    NO WBC SEEN NO ORGANISMS SEEN Performed at [facility]    Culture   Final    Culture reincubated for better growth Note: Gram Stain Report Called to,Read Back By and Verified With: [facility] TORRES 12/10/15 @ 0707P YIMSU Performed at [facility]    Report Status PENDING  Incomplete  MRSA PCR Screening     Status: None    Collection Time: 12/12/15  9:46 AM  Result Value Ref Range Status   MRSA by PCR NEGATIVE NEGATIVE Final    Comment:        The GeneXpert MRSA Assay (FDA approved for NASAL specimens only), is one component of a comprehensive MRSA colonization surveillance program. It is not intended to diagnose MRSA infection nor to guide or monitor treatment for MRSA infections.     Impression/Plan:  1. Airspace disease - seems a small bit better.  Likely infection, viral vs bacterial vs atypical.  Opacity is better and multifocal signs stable on today's CXR.  Does not appear to be CMV but will continue with ganciclovir awaiting lung micro, possible HSV.  2.  CKD - creat stable.

## 2015-12-13 NOTE — Progress Notes (Signed)
Patient currently on 15 L [place] with O2 sats of 98%. Patient is comfortable and no distress noted. RT will continue to monitor patient.

## 2015-12-13 NOTE — Progress Notes (Signed)
Name: Timothy [name] MRN: [id number] DOB: 1946-02-06    ADMISSION DATE:  12/06/2015 CONSULTATION DATE:  12/07/15  REFERRING MD :  Dr. 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    SIGNIFICANT EVENTS  12/15  Admit from [facility] with multiple c/o's - more diarrhea, SOB, slurred speech, low blood sugars 12/19 FOB>>>  MICRO:  BAL 12/19>>> normal flora  AFB 12/19>>> smear NEG  PCP 12/19>>> NEG  CMV 12/19>>>CMV IgG positive but IgM negative Legionella 12/19>>> FOB tissue culture 12/19>>>  STUDIES:  12/15  CT Chest >> bilateral airspace & ground glass opacities, atherosclerosis in coronary arteries, cardiomegaly, small R pleural effusion, non-specific gallbladder distention, trace perihepatic ascites  12/15  ECHO >> LV with mod to severe LVH, mild reduction of systolic fxn, EF [id number], No RWMA, grade 1 diastolic dysfunction, mild mitral regurg, mild aortic regurg 12/16  VQ >> LOW prob PE  ABX:  Bactrim 12/17>>>12/20 Acyclovir 12/15>>>12/21 Ganciclovir 12/21>>> Diflucan 12/15>>> Levaquin 12/20>>>   SUBJECTIVE:  Wore bipap overnight.  Breathing much more comfortable with bipap.  Remains net POS.     VITAL SIGNS: Temp:  [97.2 F (36.2 C)-98.8 F (37.1 C)] 98.3 F (36.8 C) (12/22 0832) Pulse Rate:  [62-72] 71 (12/22 0710) Resp:  [22-34] 22 (12/22 0710) BP: (113-145)/(59-81) 137/77 mmHg (12/22 0710) SpO2:  [89 %-100 %] 92 % (12/22 0710) FiO2 (%):  [50 %-60 %] 55 % (12/22 0710) Weight:  [219 lb 2.2 oz (99.4 kg)] 219 lb 2.2 oz (99.4 kg) (12/22 0421)  PHYSICAL EXAMINATION: General:  Chronically ill appearing male, comfortable on bipap   Neuro:  AAOx4, speech clear, MAE HEENT:  MM pink/moist, no jvd  Cardiovascular:   s1s2 regular, distant tones, palpable peripheral pulses  Lungs:  Even/non-labored on bipap, diminished bases, few bibasilar crackles but improved.    Abdomen:  Round/soft, bsx4 active, non-tender  Musculoskeletal:  Warm and dry, 2+ BLE edema    Recent Labs Lab 12/11/15 0536 12/12/15 0550 12/13/15 0350  NA 139 136 137  K 4.9 5.1 4.8  CL 112* 107 107  CO2 15* 17* 17*  BUN 82* 91* 111*  CREATININE 3.31* 3.78* 3.65*  GLUCOSE 174* 346* 205*    Recent Labs Lab 12/11/15 0536 12/12/15 0550 12/13/15 0350  HGB 10.9* 9.5* 9.8*  HCT 32.7* 28.3* 28.3*  WBC 3.2* 3.0* 3.8*  PLT 116* 106* 106*   Dg Chest 2 View  12/11/2015  CLINICAL DATA:  Weakness and shortness of breath today. History of pneumonia and leukemia. EXAM: CHEST  2 VIEW COMPARISON:  12/10/2015 FINDINGS: Right IJ Port-A-Cath unchanged. Lungs are somewhat hypoinflated and demonstrate continued airspace opacification over the right lobe and posterior lower lobes with slight interval improvement. Stable hazy opacification over the left mid upper lung. Small amount of bilateral pleural fluid posteriorly. Stable cardiomegaly. Remainder the exam is unchanged. IMPRESSION: Hypoinflation with multifocal airspace process with slight interval improvement likely multifocal pneumonia. Small amount of posterior pleural fluid. Right IJ Port-A-Cath unchanged. Electronically Signed   By: Marin Olp M.D.   On: 12/11/2015 11:51   Dg Chest Port 1 View  12/13/2015  CLINICAL DATA:  69 year old male with shortness of breath, respiratory failure, pneumonia. Initial encounter. EXAM: PORTABLE CHEST 1 VIEW COMPARISON:  12/12/2015 and earlier. FINDINGS: Portable AP semi  upright view at 0440 hours. Stable right chest porta cath. Stable lung volumes. Stable cardiac size and mediastinal contours. No pneumothorax or pleural effusion. Coarse and patchy bilateral perihilar opacity is stable since yesterday, mildly regressed in the right lower lung since 12/11/2015.  No areas of worsening ventilation. IMPRESSION: Bilateral multifocal pneumonia stable since yesterday. Right lower lung ventilation mildly improved since 12/11/2015. Electronically Signed   By: Genevie Ann M.D.   On: 12/13/2015 07:15   Dg Chest Port 1 View  12/12/2015  CLINICAL DATA:  Shortness of breath. Weakness. History of pneumonia and the [place]. EXAM: PORTABLE CHEST 1 VIEW COMPARISON:  12/11/2015 and previous FINDINGS: Pulmonary infiltrate in the right lower lobe is improving. However, there is worsened interstitial density throughout the lungs in general, particularly the upper lobes. This could go along with developing edema or spreading of the pneumonia. No visible effusion. Port-A-Cath on the right is unchanged. IMPRESSION: Continued improvement of the right lower lobe infiltrate. Worsening of generalized interstitial lung density, upper lobe predominant. Differential diagnosis is developing interstitial edema/ fluid overload versus generalized worsening of pneumonia. Electronically Signed   By: Nelson Chimes M.D.   On: 12/12/2015 08:20     ASSESSMENT / PLAN:  PULMONARY Bilateral Infiltrates / Ground Glass Opacities - differential broad in immunocompromised host - likely infectious - viral v bacterial v atypical.  DFA for PCP neg and bactrim stopped 12/20.   includes bacterial / viral illness, high risk for opportunistic infection.  S/p FOB 12/19.  Worsening infiltrates 12/21.  Small Right Pleural Effusion  Acute Hypoxic Respiratory Failure -  Worsening hypoxia 12/21, tx ICU.  Plan: abx per ID as above  Continue bipap for now  Continue diuresis per renal  F/u CXR  pulm hygiene as able  F/u path, FOB cultures  Cont solumedrol 60mg  q6 hrs   CARDIOVASCULAR Hx HTN  AFib  Volume overload  P:  Decrease labetalol to 300mg  BID with intermittent bradycardia  Cont lasix per renal  KVO IVF Consider CVL for CVP - hold for now with improving UOP, pt not keen on having procedure  RENAL AKI on  CKD  Hypomagnesemia  P:   F/u chem  Diuresis per renal -- Scr improving slowly - remains net POS but uop seems to be picking up  Place foley  PO bicarb  Now off bactrim  KVO HCO3 gtt   GASTROINTESTINAL No active issue  P:   NPO on bipap   HEMATOLOGIC CLL  Chronic Graft vs Host Disease  P:  Continue solumedrol (baseline prednisone 10mg  QD), acyclovir, diflucan Plan per Dr. Jana Hakim    INFECTIOUS Bilateral pulmonary infiltrates - presumed infectious  P:   abx as above per ID  Follow FOB cultures  CMV pending   ENDOCRINE Relative adrenal insufficiency Hyperglycemia  P:   Cont steroids  SSI  Add lantus 12/22  NEUROLOGIC Hx depression, anxiety  Chronic pain  P:   PRN morphine  Cont zoloft, PRN ativan    FAMILY  - Updates:  Pt updated at length 12/22.  Encouraged to wear bipap.  Discussed again and pt WOULD want intubation if needed.      Nickolas Madrid, NP 12/13/2015  8:40 AM Pager: [phone] or [phone]

## 2015-12-13 NOTE — Progress Notes (Signed)
[facility] ROUNDING NOTE   Subjective:   Interval History:  No complaints but appears very delicate  Objective:  Vital signs in last 24 hours:  Temp:  [97.2 F (36.2 C)-98.8 F (37.1 C)] 98.1 F (36.7 C) (12/22 1209) Pulse Rate:  [62-74] 74 (12/22 1400) Resp:  [22-34] 30 (12/22 1400) BP: (113-147)/(59-81) 132/69 mmHg (12/22 1400) SpO2:  [89 %-100 %] 95 % (12/22 1400) FiO2 (%):  [50 %-55 %] 55 % (12/22 0710) Weight:  [99.4 kg (219 lb 2.2 oz)] 99.4 kg (219 lb 2.2 oz) (12/22 0421)  Weight change: 3.012 kg (6 lb 10.2 oz) Filed Weights   12/12/15 0613 12/12/15 0822 12/13/15 0421  Weight: 98.3 kg (216 lb 11.4 oz) 99.9 kg (220 lb 3.8 oz) 99.4 kg (219 lb 2.2 oz)    Intake/Output: I/O last 3 completed shifts: In: 3272 [I.V.:2548; IV Piggyback:724] Out: [id number] [Urine:1350; Stool:1]   Intake/Output this shift:  Total I/O In: 491 [P.O.:275; I.V.:150; IV Piggyback:66] Out: 850 [Urine:850]  CVS- RRR   RS- CTA  Diminished breath sounds and rhonchi  ABD- BS present soft non-distended EXT-  2 + edema feet   Basic Metabolic Panel:  Recent Labs Lab 12/08/15 0440 12/09/15 0510 12/10/15 0430 12/11/15 0536 12/12/15 0550 12/13/15 0350  NA 144 141 141 139 136 137  K 4.6 4.4 4.5 4.9 5.1 4.8  CL 122* 116* 115* 112* 107 107  CO2 12* 14* 18* 15* 17* 17*  GLUCOSE 186* 161* 181* 174* 346* 205*  BUN 75* 79* 83* 82* 91* 111*  CREATININE 3.22* 3.09* 3.17* 3.31* 3.78* 3.65*  CALCIUM 7.6* 7.4* 7.7* 7.8* 7.9* 7.4*  MG 1.9  --   --   --   --   --   PHOS  --   --   --  4.3 4.7* 5.0*    Liver Function Tests:  Recent Labs Lab 12/09/15 0510 12/10/15 0430 12/11/15 0536 12/12/15 0550 12/13/15 0350  AST 22 22 21 22 21   ALT 20 19 17 17 17   ALKPHOS 74 81 70 66 68  BILITOT 0.4 0.4 0.6 0.4 0.5  PROT 5.4* 5.5* 4.8* 6.1* 5.9*  ALBUMIN 3.0* 2.9* 2.6* 2.4* 2.4*   No results for input(s): LIPASE, AMYLASE in the last 168 hours. No results for input(s): AMMONIA in the last 168  hours.  CBC:  Recent Labs Lab 12/09/15 0510 12/10/15 0430 12/11/15 0536 12/12/15 0550 12/13/15 0350  WBC 5.0 5.7 3.2* 3.0* 3.8*  HGB 11.4* 11.8* 10.9* 9.5* 9.8*  HCT 35.5* 35.1* 32.7* 28.3* 28.3*  MCV 90.8 90.2 90.3 89.3 87.6  PLT 129* 118* 116* 106* 106*    Cardiac Enzymes:  Recent Labs Lab 12/06/15 2150 12/07/15 0200 12/07/15 1010 12/08/15 0440  CKTOTAL  --   --  102  --   CKMB  --   --  10.5*  --   TROPONINI 0.05* 0.05*  --  0.05*    BNP: Invalid input(s): POCBNP  CBG:  Recent Labs Lab 12/12/15 2022 12/13/15 0001 12/13/15 0405 12/13/15 0828 12/13/15 1207  GLUCAP 166* 174* 192* 17* 199*    Microbiology: Results for orders placed or performed during the hospital encounter of 12/06/15  C difficile quick scan w PCR reflex     Status: None   Collection Time: 12/08/15  3:02 PM  Result Value Ref Range Status   C Diff antigen NEGATIVE NEGATIVE Final   C Diff toxin NEGATIVE NEGATIVE Final   C Diff interpretation Negative for toxigenic C. difficile  Final  AFB culture with smear     Status: None (Preliminary result)   Collection Time: 12/10/15 11:33 AM  Result Value Ref Range Status   Specimen Description BRONCHIAL ALVEOLAR LAVAGE  Final   Special Requests Immunocompromised  Final   Acid Fast Smear   Final    NO ACID FAST BACILLI SEEN Performed at [facility]    Culture   Final    CULTURE WILL BE EXAMINED FOR 6 WEEKS BEFORE ISSUING A FINAL REPORT Performed at [facility]    Report Status PENDING  Incomplete  Culture, bal-quantitative     Status: None   Collection Time: 12/10/15 11:35 AM  Result Value Ref Range Status   Specimen Description BRONCHIAL ALVEOLAR LAVAGE  Final   Special Requests Immunocompromised  Final   Gram Stain   Final    MODERATE WBC PRESENT, PREDOMINANTLY MONONUCLEAR RARE SQUAMOUS EPITHELIAL CELLS PRESENT NO ORGANISMS SEEN Performed at [place]   Final    35,000  COLONIES/ML Performed at [facility]    Culture   Final    NORMAL OROPHARYNGEAL FLORA Performed at [facility]    Report Status 12/12/2015 FINAL  Final  Fungus Culture with Smear     Status: None (Preliminary result)   Collection Time: 12/10/15 11:38 AM  Result Value Ref Range Status   Specimen Description BRONCHIAL ALVEOLAR LAVAGE  Final   Special Requests Immunocompromised  Final   Fungal Smear   Final    NO YEAST OR FUNGAL ELEMENTS SEEN Performed at [facility]    Culture   Final    CULTURE IN PROGRESS FOR FOUR WEEKS Performed at [facility]    Report Status PENDING  Incomplete  Pneumocystis smear by DFA     Status: None   Collection Time: 12/10/15 11:39 AM  Result Value Ref Range Status   Specimen Source-PJSRC BRONCHIAL ALVEOLAR LAVAGE  Final   Pneumocystis jiroveci Ag NEGATIVE  Final    Comment: Performed at [place] [facility]  Tissue culture     Status: None (Preliminary result)   Collection Time: 12/10/15 11:40 AM  Result Value Ref Range Status   Specimen Description BIOPSY LUNG TRANSBRONCHIAL  Final   Special Requests Immunocompromised  Final   Gram Stain   Final    NO WBC SEEN NO ORGANISMS SEEN Performed at [facility]    Culture   Final    Culture reincubated for better growth Note: Gram Stain Report Called to,Read Back By and Verified With: [facility] TORRES 12/10/15 @ 0707P YIMSU Performed at [facility]    Report Status PENDING  Incomplete  MRSA PCR Screening     Status: None   Collection Time: 12/12/15  9:46 AM  Result Value Ref Range Status   MRSA by PCR NEGATIVE NEGATIVE Final    Comment:        The GeneXpert MRSA Assay (FDA approved for NASAL specimens only), is one component of a comprehensive MRSA colonization surveillance program. It is not intended to diagnose MRSA infection nor to guide or monitor treatment for MRSA infections.    *Note: Due to a large number of results and/or  encounters for the requested time period, some results have not been displayed. A complete set of results can be found in Results Review.    Coagulation Studies: No results for input(s): LABPROT, INR in the last 72 hours.  Urinalysis: No results for input(s):  COLORURINE, LABSPEC, [place], GLUCOSEU, HGBUR, BILIRUBINUR, KETONESUR, PROTEINUR, UROBILINOGEN, NITRITE, LEUKOCYTESUR in the last 72 hours.  Invalid input(s): APPERANCEUR    Imaging: Dg Chest Port 1 View  12/13/2015  CLINICAL DATA:  69 year old male with shortness of breath, respiratory failure, pneumonia. Initial encounter. EXAM: PORTABLE CHEST 1 VIEW COMPARISON:  12/12/2015 and earlier. FINDINGS: Portable AP semi upright view at 0440 hours. Stable right chest porta cath. Stable lung volumes. Stable cardiac size and mediastinal contours. No pneumothorax or pleural effusion. Coarse and patchy bilateral perihilar opacity is stable since yesterday, mildly regressed in the right lower lung since 12/11/2015. No areas of worsening ventilation. IMPRESSION: Bilateral multifocal pneumonia stable since yesterday. Right lower lung ventilation mildly improved since 12/11/2015. Electronically Signed   By: Genevie Ann M.D.   On: 12/13/2015 07:15   Dg Chest Port 1 View  12/12/2015  CLINICAL DATA:  Shortness of breath. Weakness. History of pneumonia and the [place]. EXAM: PORTABLE CHEST 1 VIEW COMPARISON:  12/11/2015 and previous FINDINGS: Pulmonary infiltrate in the right lower lobe is improving. However, there is worsened interstitial density throughout the lungs in general, particularly the upper lobes. This could go along with developing edema or spreading of the pneumonia. No visible effusion. Port-A-Cath on the right is unchanged. IMPRESSION: Continued improvement of the right lower lobe infiltrate. Worsening of generalized interstitial lung density, upper lobe predominant. Differential diagnosis is developing interstitial edema/ fluid overload versus  generalized worsening of pneumonia. Electronically Signed   By: Nelson Chimes M.D.   On: 12/12/2015 08:20     Medications:   .  sodium bicarbonate  infusion 1000 mL 30 mL/hr at 12/12/15 1101   . sodium chloride   Intravenous Once  . antiseptic oral rinse  7 mL Mouth Rinse BID  . budesonide  3 mg Oral TID  . cholestyramine  4 g Oral BID  . feeding supplement (GLUCERNA SHAKE)  237 mL Oral BID BM  . fluconazole  100 mg Oral Daily  . furosemide  160 mg Intravenous Q6H  . ganciclovir (CYTOVENE) IV  1.25 mg/kg Intravenous Q24H  . insulin aspart  0-20 Units Subcutaneous 6 times per day  . insulin glargine  5 Units Subcutaneous Daily  . labetalol  300 mg Oral BID  . levofloxacin  750 mg Oral Q48H  . loratadine  10 mg Oral Daily  . methylPREDNISolone (SOLU-MEDROL) injection  60 mg Intravenous Q6H  . pantoprazole  40 mg Oral Daily  . sertraline  50 mg Oral q morning - 10a  . sodium bicarbonate  1,300 mg Oral TID  . tacrolimus  1 mg Oral BID   HYDROcodone-acetaminophen, hydrocortisone, LORazepam, menthol-cetylpyridinium, morphine injection, ondansetron, prochlorperazine, technetium TC 58M diethylenetriame-pentaacetic acid  Assessment/ Plan:   Acute kidney injury appears stable with slightly decreased creatinine  Respiratory failure per CCM  S/P bone marrow transplant  Volume overload will continue diuresis     LOS: 7 Ramyah Pankowski W @TODAY @2 :55 PM

## 2015-12-13 NOTE — Progress Notes (Signed)
Patient is currently on 12L and 50% Venturi mask. Patient is not SOB and no increased WOB noted. BiPAP not needed at this time. RT will continue to monitor patient.

## 2015-12-13 NOTE — Progress Notes (Signed)
Nutrition Follow-up  DOCUMENTATION CODES:   Not applicable  INTERVENTION:  - Will order Glucerna Shake po TID, each supplement provides 220 kcal and 10 grams of protein - If pt unable to consume PO and if TF required while on BiPAP, recommend Glucerna 1.2 @ 50 mL/hr with 30 mL Prostat TID which will provide 1740 kcal, 117 grams protein, and 966 mL free water - RD will continue to monitor for needs  NUTRITION DIAGNOSIS:   Inadequate oral intake related to acute illness as evidenced by per patient/family report, percent weight loss. -ongoing  GOAL:   Patient will meet greater than or equal to 90% of their needs -unmet  MONITOR:   PO intake, Supplement acceptance, Weight trends, Labs, Skin, I & O's  ASSESSMENT:   Timothy [name] return to the clinic today for a scheduled visit. He receives intravenous fluids and intravenous magnesium every 2 weeks. He has multiple chronic complaints, which have been generally stable Today however before the visit his wife Timothy [name] called to tell Timothy [name] that he had some difficulty breathing and a bit of slurred speech. He has had more diarrhea recently, not in terms of frequency but in terms of volume. He has lost quite a bit of fluid there. He has had no cough or fevers despite the shortness of breath. He continues to problems with swollen feet. His blood sugars have been running low, even though he has been using less insulin per her report.   12/22 Per chart review, pt consumed 30% of lunch 12/20 with no other intakes documented since initial assessment. Pt on BiPAP and untouched breakfast tray at bedside. Pt reports that he was told he is permitted to take BiPAP off for meals but that he is not hungry or interested in consuming breakfast this AM. Will order Glucerna Shake as it may be easier for pt to consume liquids rather than solid foods given respiratory distress. Should pt be unable to consume PO on BiPAP, TF recommendations as outlined above.  Not meeting needs.  Medications reviewed. Labs reviewed; CBGs: 166-342 mg/dL, BUN/creatinine elevated, Ca: 7.4 mg/dL, Phos: 5 mg/dL, GFR: 16.    12/16 - Spoke with pt at bedside. - Reports that SOB has increased over a number of weeks along with blood glucose running low, even though he has been using less insulin.  - Also reports 4# wt loss in 1 week (2% wt loss) which is significant for time frame - Reports low blood sugars have hurt his appetite some. - Per chart, PO intake of 75% thus far. - Patient does have diarrhea regularly - related to bone marrow transplant. 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  Diet Order:  Diet Carb Modified Fluid consistency:: Thin; Room service appropriate?: Yes; Fluid restriction:: 1200 mL Fluid  Skin:  Reviewed, no issues  Last BM:  12/21  Height:   Ht Readings from Last 1 Encounters:  12/12/15 5\' 7"  (1.702 m)    Weight:   Wt Readings from Last 1 Encounters:  12/13/15 219 lb 2.2 oz (99.4 kg)    Ideal Body Weight:  64.54 kg  BMI:  Body mass index is 34.31 kg/(m^2).  Estimated Nutritional Needs:   Kcal:  1600-1800  Protein:  100-120 grams  Fluid:  >/= 1.9L  EDUCATION NEEDS:   No education needs identified at this time     Jarome Matin, RD, LDN Inpatient Clinical Dietitian Pager # [phone] After hours/weekend pager # [phone]

## 2015-12-13 NOTE — Plan of Care (Signed)
Problem: Safety: Goal: Ability to remain free from injury will improve Outcome: Progressing Completed patient's fall risk assessment.  Patient's bed alarm is on, and bed is in the lowest position.

## 2015-12-14 ENCOUNTER — Other Ambulatory Visit: Payer: Self-pay | Admitting: Oncology

## 2015-12-14 ENCOUNTER — Inpatient Hospital Stay (HOSPITAL_COMMUNITY): Payer: BC Managed Care – PPO

## 2015-12-14 DIAGNOSIS — J9600 Acute respiratory failure, unspecified whether with hypoxia or hypercapnia: Secondary | ICD-10-CM | POA: Diagnosis present

## 2015-12-14 LAB — COMPREHENSIVE METABOLIC PANEL
ALT: 18 U/L (ref 17–63)
ANION GAP: 14 (ref 5–15)
AST: 23 U/L (ref 15–41)
Albumin: 2.5 g/dL — ABNORMAL LOW (ref 3.5–5.0)
Alkaline Phosphatase: 90 U/L (ref 38–126)
BUN: 121 mg/dL — ABNORMAL HIGH (ref 6–20)
CHLORIDE: 104 mmol/L (ref 101–111)
CO2: 22 mmol/L (ref 22–32)
CREATININE: 3.82 mg/dL — AB (ref 0.61–1.24)
Calcium: 7 mg/dL — ABNORMAL LOW (ref 8.9–10.3)
GFR, EST AFRICAN AMERICAN: 17 mL/min — AB (ref >60)
GFR, EST NON AFRICAN AMERICAN: 15 mL/min — AB (ref >60)
Glucose, Bld: 213 mg/dL — ABNORMAL HIGH (ref 65–99)
Potassium: 3.9 mmol/L (ref 3.5–5.1)
SODIUM: 140 mmol/L (ref 135–145)
Total Bilirubin: 0.6 mg/dL (ref 0.3–1.2)
Total Protein: 6 g/dL — ABNORMAL LOW (ref 6.5–8.1)

## 2015-12-14 LAB — MISC LABCORP TEST (SEND OUT)

## 2015-12-14 LAB — ASPERGILLUS ANTIBODY BY IMMUNODIFF: ASPERGILLUS ANTIBODY ID: NEGATIVE

## 2015-12-14 LAB — CBC
HCT: 30.1 % — ABNORMAL LOW (ref 39.0–52.0)
HEMOGLOBIN: 10.2 g/dL — AB (ref 13.0–17.0)
MCH: 29.8 pg (ref 26.0–34.0)
MCHC: 33.9 g/dL (ref 30.0–36.0)
MCV: 88 fL (ref 78.0–100.0)
PLATELETS: 84 10*3/uL — AB (ref 150–400)
RBC: 3.42 MIL/uL — AB (ref 4.22–5.81)
RDW: 16.3 % — ABNORMAL HIGH (ref 11.5–15.5)
WBC: 4 10*3/uL (ref 4.0–10.5)

## 2015-12-14 LAB — MAGNESIUM: Magnesium: 1.6 mg/dL — ABNORMAL LOW (ref 1.7–2.4)

## 2015-12-14 LAB — PHOSPHORUS: PHOSPHORUS: 5.5 mg/dL — AB (ref 2.5–4.6)

## 2015-12-14 LAB — GLUCOSE, CAPILLARY
GLUCOSE-CAPILLARY: 190 mg/dL — AB (ref 65–99)
GLUCOSE-CAPILLARY: 204 mg/dL — AB (ref 65–99)
GLUCOSE-CAPILLARY: 179 mg/dL — AB (ref 65–99)
Glucose-Capillary: 188 mg/dL — ABNORMAL HIGH (ref 65–99)
Glucose-Capillary: 205 mg/dL — ABNORMAL HIGH (ref 65–99)

## 2015-12-14 LAB — CMV DNA, QUANTITATIVE, PCR
CMV DNA QUANT: POSITIVE [IU]/mL
LOG10 CMV QN DNA PL: UNDETERMINED {Log_IU}/mL

## 2015-12-14 MED ORDER — DILTIAZEM HCL 25 MG/5ML IV SOLN
5.0000 mg | Freq: Once | INTRAVENOUS | Status: AC
Start: 2015-12-14 — End: 2015-12-14
  Administered 2015-12-14: 5 mg via INTRAVENOUS
  Filled 2015-12-14: qty 5

## 2015-12-14 MED ORDER — ONDANSETRON HCL 4 MG/2ML IJ SOLN
4.0000 mg | INTRAMUSCULAR | Status: DC | PRN
  Administered 2015-12-14 – 2015-12-26 (×10): 4 mg via INTRAVENOUS
  Filled 2015-12-14 (×10): qty 2

## 2015-12-14 MED ORDER — SODIUM CHLORIDE 0.9 % IJ SOLN
10.0000 mL | Freq: Two times a day (BID) | INTRAMUSCULAR | Status: DC
  Administered 2015-12-14 – 2015-12-19 (×7): 10 mL
  Administered 2015-12-19: 30 mL
  Administered 2015-12-20: 20 mL
  Administered 2015-12-21 – 2015-12-25 (×7): 10 mL

## 2015-12-14 MED ORDER — INSULIN ASPART 100 UNIT/ML ~~LOC~~ SOLN
0.0000 [IU] | Freq: Three times a day (TID) | SUBCUTANEOUS | Status: DC
  Administered 2015-12-14: 4 [IU] via SUBCUTANEOUS
  Administered 2015-12-14 (×2): 7 [IU] via SUBCUTANEOUS
  Administered 2015-12-15 (×3): 4 [IU] via SUBCUTANEOUS
  Administered 2015-12-15: 11 [IU] via SUBCUTANEOUS
  Administered 2015-12-16: 3 [IU] via SUBCUTANEOUS
  Administered 2015-12-16: 15 [IU] via SUBCUTANEOUS
  Administered 2015-12-16 (×2): 4 [IU] via SUBCUTANEOUS
  Administered 2015-12-17: 11 [IU] via SUBCUTANEOUS

## 2015-12-14 MED ORDER — SODIUM CHLORIDE 0.9 % IJ SOLN
10.0000 mL | INTRAMUSCULAR | Status: DC | PRN
  Administered 2015-12-14: 10 mL
  Administered 2015-12-17: 20 mL
  Administered 2015-12-22 – 2015-12-28 (×6): 10 mL
  Filled 2015-12-14 (×7): qty 40

## 2015-12-14 MED ORDER — DILTIAZEM HCL 100 MG IV SOLR
5.0000 mg/h | INTRAVENOUS | Status: DC
  Administered 2015-12-15: 5 mg/h via INTRAVENOUS
  Administered 2015-12-15 – 2015-12-16 (×4): 10 mg/h via INTRAVENOUS
  Administered 2015-12-17: 5 mg/h via INTRAVENOUS
  Filled 2015-12-14 (×7): qty 100

## 2015-12-14 MED ORDER — INSULIN GLARGINE 100 UNIT/ML ~~LOC~~ SOLN
25.0000 [IU] | Freq: Every day | SUBCUTANEOUS | Status: DC
  Administered 2015-12-15: 25 [IU] via SUBCUTANEOUS
  Filled 2015-12-14: qty 0.25

## 2015-12-14 MED ORDER — SODIUM CHLORIDE 0.9 % IV SOLN
500.0000 mg | Freq: Two times a day (BID) | INTRAVENOUS | Status: DC
  Administered 2015-12-14 – 2015-12-16 (×5): 500 mg via INTRAVENOUS
  Filled 2015-12-14 (×3): qty 500
  Filled 2015-12-14: qty 4
  Filled 2015-12-14: qty 500
  Filled 2015-12-14 (×3): qty 4

## 2015-12-14 NOTE — Progress Notes (Signed)
[facility] ROUNDING NOTE   Subjective:   Interval History: Appears to be a little better less short of breath   Objective:  Vital signs in last 24 hours:  Temp:  [97.8 F (36.6 C)-99 F (37.2 C)] 98 F (36.7 C) (12/23 1224) Pulse Rate:  [73-81] 78 (12/23 1200) Resp:  [20-32] 26 (12/23 1200) BP: (126-162)/(69-92) 159/92 mmHg (12/23 1200) SpO2:  [91 %-100 %] 95 % (12/23 1200) FiO2 (%):  [50 %] 50 % (12/22 2000) Weight:  [97.8 kg (215 lb 9.8 oz)] 97.8 kg (215 lb 9.8 oz) (12/23 0437)  Weight change: -2.1 kg (-4 lb 10.1 oz) Filed Weights   12/12/15 0822 12/13/15 0421 12/14/15 0437  Weight: 99.9 kg (220 lb 3.8 oz) 99.4 kg (219 lb 2.2 oz) 97.8 kg (215 lb 9.8 oz)    Intake/Output: I/O last 3 completed shifts: In: 2235 [P.O.:785; I.V.:1020; IV Piggyback:430] Out: 3055 [Urine:3055]   Intake/Output this shift:  Total I/O In: 216 [I.V.:150; IV Piggyback:66] Out: 400 [Urine:400]  CVS- RRR RS- CTA  Diminished breath sounds and rhonchi  ABD- BS present soft non-distended EXT-  1 + edema   Basic Metabolic Panel:  Recent Labs Lab 12/08/15 0440  12/10/15 0430 12/11/15 0536 12/12/15 0550 12/13/15 0350 12/14/15 0430  NA 144  < > 141 139 136 137 140  K 4.6  < > 4.5 4.9 5.1 4.8 3.9  CL 122*  < > 115* 112* 107 107 104  CO2 12*  < > 18* 15* 17* 17* 22  GLUCOSE 186*  < > 181* 174* 346* 205* 213*  BUN 75*  < > 83* 82* 91* 111* 121*  CREATININE 3.22*  < > 3.17* 3.31* 3.78* 3.65* 3.82*  CALCIUM 7.6*  < > 7.7* 7.8* 7.9* 7.4* 7.0*  MG 1.9  --   --   --   --   --  1.6*  PHOS  --   --   --  4.3 4.7* 5.0* 5.5*  < > = values in this interval not displayed.  Liver Function Tests:  Recent Labs Lab 12/10/15 0430 12/11/15 0536 12/12/15 0550 12/13/15 0350 12/14/15 0430  AST 22 21 22 21 23   ALT 19 17 17 17 18   ALKPHOS 81 70 66 68 90  BILITOT 0.4 0.6 0.4 0.5 0.6  PROT 5.5* 4.8* 6.1* 5.9* 6.0*  ALBUMIN 2.9* 2.6* 2.4* 2.4* 2.5*   No results for input(s): LIPASE,  AMYLASE in the last 168 hours. No results for input(s): AMMONIA in the last 168 hours.  CBC:  Recent Labs Lab 12/10/15 0430 12/11/15 0536 12/12/15 0550 12/13/15 0350 12/14/15 0430  WBC 5.7 3.2* 3.0* 3.8* 4.0  HGB 11.8* 10.9* 9.5* 9.8* 10.2*  HCT 35.1* 32.7* 28.3* 28.3* 30.1*  MCV 90.2 90.3 89.3 87.6 88.0  PLT 118* 116* 106* 106* 84*    Cardiac Enzymes:  Recent Labs Lab 12/08/15 0440  TROPONINI 0.05*    BNP: Invalid input(s): POCBNP  CBG:  Recent Labs Lab 12/13/15 2007 12/13/15 2321 12/14/15 0407 12/14/15 0727 12/14/15 1145  GLUCAP 221* 243* 188* 190* 205*    Microbiology: Results for orders placed or performed during the hospital encounter of 12/06/15  C difficile quick scan w PCR reflex     Status: None   Collection Time: 12/08/15  3:02 PM  Result Value Ref Range Status   C Diff antigen NEGATIVE NEGATIVE Final   C Diff toxin NEGATIVE NEGATIVE Final   C Diff interpretation Negative for toxigenic C. difficile  Final  AFB culture with smear     Status: None (Preliminary result)   Collection Time: 12/10/15 11:33 AM  Result Value Ref Range Status   Specimen Description BRONCHIAL ALVEOLAR LAVAGE  Final   Special Requests Immunocompromised  Final   Acid Fast Smear   Final    NO ACID FAST BACILLI SEEN Performed at [facility]    Culture   Final    CULTURE WILL BE EXAMINED FOR 6 WEEKS BEFORE ISSUING A FINAL REPORT Performed at [facility]    Report Status PENDING  Incomplete  Culture, bal-quantitative     Status: None   Collection Time: 12/10/15 11:35 AM  Result Value Ref Range Status   Specimen Description BRONCHIAL ALVEOLAR LAVAGE  Final   Special Requests Immunocompromised  Final   Gram Stain   Final    MODERATE WBC PRESENT, PREDOMINANTLY MONONUCLEAR RARE SQUAMOUS EPITHELIAL CELLS PRESENT NO ORGANISMS SEEN Performed at [facility]   Final    35,000 COLONIES/ML Performed at [facility]     Culture   Final    NORMAL OROPHARYNGEAL FLORA Performed at [facility]    Report Status 12/12/2015 FINAL  Final  Fungus Culture with Smear     Status: None (Preliminary result)   Collection Time: 12/10/15 11:38 AM  Result Value Ref Range Status   Specimen Description BRONCHIAL ALVEOLAR LAVAGE  Final   Special Requests Immunocompromised  Final   Fungal Smear   Final    NO YEAST OR FUNGAL ELEMENTS SEEN Performed at [facility]    Culture   Final    CULTURE IN PROGRESS FOR FOUR WEEKS Performed at [facility]    Report Status PENDING  Incomplete  Pneumocystis smear by DFA     Status: None   Collection Time: 12/10/15 11:39 AM  Result Value Ref Range Status   Specimen Source-PJSRC BRONCHIAL ALVEOLAR LAVAGE  Final   Pneumocystis jiroveci Ag NEGATIVE  Final    Comment: Performed at [facility]  Tissue culture     Status: None (Preliminary result)   Collection Time: 12/10/15 11:40 AM  Result Value Ref Range Status   Specimen Description BIOPSY LUNG TRANSBRONCHIAL  Final   Special Requests Immunocompromised  Final   Gram Stain   Final    NO WBC SEEN NO ORGANISMS SEEN Performed at [facility]    Culture   Final    Culture reincubated for better growth Note: Gram Stain Report Called to,Read Back By and Verified With: [facility] TORRES 12/10/15 @ 0707P YIMSU Performed at [facility]    Report Status PENDING  Incomplete  MRSA PCR Screening     Status: None   Collection Time: 12/12/15  9:46 AM  Result Value Ref Range Status   MRSA by PCR NEGATIVE NEGATIVE Final    Comment:        The GeneXpert MRSA Assay (FDA approved for NASAL specimens only), is one component of a comprehensive MRSA colonization surveillance program. It is not intended to diagnose MRSA infection nor to guide or monitor treatment for MRSA infections.    *Note: Due to a large number of results and/or encounters for the requested time period, some  results have not been displayed. A complete set of results can be found in Results Review.    Coagulation Studies: No results for input(s): LABPROT, INR in the last 72 hours.  Urinalysis: No results for input(s):  COLORURINE, LABSPEC, [place], GLUCOSEU, HGBUR, BILIRUBINUR, KETONESUR, PROTEINUR, UROBILINOGEN, NITRITE, LEUKOCYTESUR in the last 72 hours.  Invalid input(s): APPERANCEUR    Imaging: Dg Chest Port 1 View  12/14/2015  CLINICAL DATA:  Respiratory failure. EXAM: PORTABLE CHEST - 1 VIEW COMPARISON:  One-view chest x-ray 12/13/2015. FINDINGS: The heart size is normal. The lung volumes remain low. A diffuse interstitial pattern is slightly increased compared to the prior study. Bibasilar airspace disease is evident. A right IJ Port-A-Cath is stable. IMPRESSION: Slight increase in diffuse interstitial and airspace pattern compatible with edema and pneumonia. Electronically Signed   By: San Morelle M.D.   On: 12/14/2015 07:31   Dg Chest Port 1 View  12/13/2015  CLINICAL DATA:  69 year old male with shortness of breath, respiratory failure, pneumonia. Initial encounter. EXAM: PORTABLE CHEST 1 VIEW COMPARISON:  12/12/2015 and earlier. FINDINGS: Portable AP semi upright view at 0440 hours. Stable right chest porta cath. Stable lung volumes. Stable cardiac size and mediastinal contours. No pneumothorax or pleural effusion. Coarse and patchy bilateral perihilar opacity is stable since yesterday, mildly regressed in the right lower lung since 12/11/2015. No areas of worsening ventilation. IMPRESSION: Bilateral multifocal pneumonia stable since yesterday. Right lower lung ventilation mildly improved since 12/11/2015. Electronically Signed   By: Genevie Ann M.D.   On: 12/13/2015 07:15     Medications:   .  sodium bicarbonate  infusion 1000 mL 30 mL/hr at 12/13/15 1956   . sodium chloride   Intravenous Once  . antiseptic oral rinse  7 mL Mouth Rinse BID  . budesonide  3 mg Oral TID  .  cholestyramine  4 g Oral BID  . feeding supplement (GLUCERNA SHAKE)  237 mL Oral BID BM  . fluconazole  100 mg Oral Daily  . furosemide  160 mg Intravenous Q6H  . ganciclovir (CYTOVENE) IV  1.25 mg/kg Intravenous Q24H  . insulin aspart  0-20 Units Subcutaneous TID AC & HS  . [START ON 12/15/2015] insulin glargine  25 Units Subcutaneous Daily  . labetalol  300 mg Oral BID  . levofloxacin  750 mg Oral Q48H  . loratadine  10 mg Oral Daily  . methylPREDNISolone (SOLU-MEDROL) injection  500 mg Intravenous Q12H  . pantoprazole  40 mg Oral Daily  . sertraline  50 mg Oral q morning - 10a  . sodium bicarbonate  1,300 mg Oral TID  . tacrolimus  1 mg Oral BID   HYDROcodone-acetaminophen, hydrocortisone, LORazepam, menthol-cetylpyridinium, morphine injection, ondansetron (ZOFRAN) IV, prochlorperazine, technetium TC 15M diethylenetriame-pentaacetic acid  Assessment/ Plan:   Acute kidney injury appears stable   Creatinine has increased   Respiratory failure per CCM    Levoquin, fluconazole and solumedrol   S/P bone marrow transplant  Volume overload will continue diuresis   IV diuretics     LOS: 8 Cohen Boettner W @TODAY @12 :59 PM

## 2015-12-14 NOTE — Progress Notes (Signed)
Name: Timothy [name] MRN: [id number] DOB: Jul 16, 1946    ADMISSION DATE:  12/06/2015 CONSULTATION DATE:  12/07/15  REFERRING MD :  Dr. 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    SIGNIFICANT EVENTS  12/15  Admit from [facility] with multiple c/o's - more diarrhea, SOB, slurred speech, low blood sugars 12/19 FOB>>>  MICRO:  BAL 12/19>>> normal flora  AFB 12/19>>> smear NEG  PCP 12/19>>> NEG  CMV 12/19>>>CMV IgG positive but IgM negative Legionella 12/19>>> FOB tissue culture 12/19>>>  STUDIES:  12/15  CT Chest >> bilateral airspace & ground glass opacities, atherosclerosis in coronary arteries, cardiomegaly, small R pleural effusion, non-specific gallbladder distention, trace perihepatic ascites  12/15  ECHO >> LV with mod to severe LVH, mild reduction of systolic fxn, EF [id number], No RWMA, grade 1 diastolic dysfunction, mild mitral regurg, mild aortic regurg 12/16  VQ >> LOW prob PE  ABX:  Bactrim 12/17>>>12/20 Acyclovir 12/15>>>12/21 Ganciclovir 12/21>>> Diflucan 12/15>>> Levaquin 12/20>>>   SUBJECTIVE: No acute issues overnight. Was on bipap overnight till 5am and then was switched to [place] at 7L. O2 saturation 95-98%. Reports feeling much better and voiding well. Denies pain, chest pain, but reports nausea but no vomiting.   VITAL SIGNS: Temp:  [97.8 F (36.6 C)-99 F (37.2 C)] 97.8 F (36.6 C) (12/23 0812) Pulse Rate:  [67-80] 79 (12/23 0800) Resp:  [20-32] 24 (12/23 0800) BP: (126-162)/(69-92) 162/92 mmHg (12/23 0800) SpO2:  [91 %-100 %] 98 % (12/23 0800) FiO2 (%):  [50 %] 50 % (12/22 2000) Weight:  [97.8 kg (215 lb 9.8 oz)] 97.8 kg (215 lb 9.8 oz) (12/23 0437)  PHYSICAL EXAMINATION: General:  Chronically  ill appearing male, comfortable on [place] Neuro:  AAO x 4, speech clear, MAE HEENT:  MM pink/moist, no jvd  Cardiovascular: HR regular at 87bpm,  s1/s2 regular, +2 peripheral pulses  Lungs:  Even/non-labored on [place], diminished bases, few bibasilar crackles.    Abdomen:  Round/soft, bsx4 active, non-tender  Musculoskeletal:  Warm and dry, 2+ BLE edema SKIN: Bruising in BLUE    Recent Labs Lab 12/12/15 0550 12/13/15 0350 12/14/15 0430  NA 136 137 140  K 5.1 4.8 3.9  CL 107 107 104  CO2 17* 17* 22  BUN 91* 111* 121*  CREATININE 3.78* 3.65* 3.82*  GLUCOSE 346* 205* 213*    Recent Labs Lab 12/12/15 0550 12/13/15 0350 12/14/15 0430  HGB 9.5* 9.8* 10.2*  HCT 28.3* 28.3* 30.1*  WBC 3.0* 3.8* 4.0  PLT 106* 106* 84*   Dg Chest Port 1 View  12/14/2015  CLINICAL DATA:  Respiratory failure. EXAM: PORTABLE CHEST - 1 VIEW COMPARISON:  One-view chest x-ray 12/13/2015. FINDINGS: The heart size is normal. The lung volumes remain low. A diffuse interstitial pattern is slightly increased compared to the prior study. Bibasilar airspace disease is evident. A right IJ Port-A-Cath is stable. IMPRESSION: Slight increase in diffuse interstitial and airspace pattern compatible with edema and pneumonia. Electronically Signed   By: San Morelle M.D.   On: 12/14/2015 07:31   Dg Chest Port 1 View  12/13/2015  CLINICAL DATA:  69 year old male with shortness of breath, respiratory failure, pneumonia. Initial encounter. EXAM: PORTABLE CHEST 1 VIEW COMPARISON:  12/12/2015 and earlier. FINDINGS: Portable AP semi upright view at 0440  hours. Stable right chest porta cath. Stable lung volumes. Stable cardiac size and mediastinal contours. No pneumothorax or pleural effusion. Coarse and patchy bilateral perihilar opacity is stable since yesterday, mildly regressed in the right lower lung since 12/11/2015. No areas of worsening ventilation. IMPRESSION: Bilateral multifocal pneumonia stable since yesterday. Right  lower lung ventilation mildly improved since 12/11/2015. Electronically Signed   By: Genevie Ann M.D.   On: 12/13/2015 07:15     ASSESSMENT / PLAN:  PULMONARY Bilateral Infiltrates / Ground Glass Opacities - differential broad in immunocompromised host - likely infectious - viral v bacterial v atypical; includes bacterial / viral illness as patient is high risk for opportunistic infection.  DFA for PCP neg and bactrim stopped 12/20. S/p FOB 12/19.  Worsening infiltrates 12/23 on CXR.  Small Right Pleural Effusion  Acute Hypoxic Respiratory Failure - Improved hypoxia Plan: Abx per ID as above  Continue bipap at HS and [place] as tolerated and titrate FiO2 too O2 sat>90% Continue diuresis per renal  F/u CXR  Pulm hygiene per protocol F/u path, FOB cultures  Cont solumedrol 60mg  q6 hrs   CARDIOVASCULAR Hx HTN  AFib  Volume overload  Intermittent bradycardia-HR stable in the 70s P:  Continue labetalol to 300mg  BID  Monitor HR for bradycardia  Cont lasix per renal  KVO IVF  RENAL AKI on CKD-Remains net positive on I/O but urine output improving Hypomagnesemia  P:   F/u chem and magnesium level Diuresis per renal -- Scr improving slowly PO bicarb  KVO HCO3 gtt  Avoid nephrotoxic medications  GASTROINTESTINAL Nausea without vomiting P:   NPO on bipap but encourage po intake when off BiPAP GI prophylaxis Zofran prn  HEMATOLOGIC CLL  Chronic Graft vs Host Disease  P:  Continue solumedrol (baseline prednisone 10mg  QD), acyclovir, prograf and diflucan Plan per Dr. Jana Hakim   INFECTIOUS Bilateral pulmonary infiltrates - presumed infectious  P:   Abx as above per ID  Follow FOB cultures  CMV negative to date; final cultures pending  ENDOCRINE Relative adrenal insufficiency Hyperglycemia  P:   Cont steroids  SSI  Lantus added 12/22  NEUROLOGIC Hx depression, anxiety  Chronic pain  P:   PRN morphine  Cont zoloft, PRN ativan    FAMILY  - Updates:  Pt updated at  bedside.  Encouraged to wear bipap at HS and continue oral intake.   Critical care time spent examining patient, establishing treatment plan, reviewing history and labs, CXR, and reviewing treatment plan with patient is 30 minutes Magdalene S. [facility] ANP-BC Pulmonary and [place] Pager: [phone]

## 2015-12-14 NOTE — Progress Notes (Signed)
Pharmacy Antibiotic Follow-up Note  Timothy [name] is a 69 y.o. year-old male admitted on 12/06/2015.  The patient is currently on day 4 of Levaquin 750 mg PO q48h and day 3 of ganciclovir (current dosage 125 mg IV q24h) for pulmonary infection of uncertain etiology.  Dosages of both antimicrobials have been adjusted for renal insufficiency. The patient is on immunosuppressive therapy following allogeneic BMT for CLL in 2013; has GVHD with skin and GI manifestations.  Has had progressive decline in kidney function over the past several months; SCr was typically ranging in the "mid 2's" and has increased during the present admission.   Assessment: Pulmonary infection of uncertain etiology in setting of immunosuppressive therapy for GVHD AoCKD.  ID service is following.  Plan: Continue present Levaquin dosage (750 mg PO q48h) Continue present ganciclovir dosage (125 mg IV q24H) Follow renal function, culture results, viral serologies, clinical course.     Temp (24hrs), Avg:98.2 F (36.8 C), Min:97.8 F (36.6 C), Max:99 F (37.2 C)   Recent Labs Lab 12/10/15 0430 12/11/15 0536 12/12/15 0550 12/13/15 0350 12/14/15 0430  WBC 5.7 3.2* 3.0* 3.8* 4.0    Recent Labs Lab 12/10/15 0430 12/11/15 0536 12/12/15 0550 12/13/15 0350 12/14/15 0430  CREATININE 3.17* 3.31* 3.78* 3.65* 3.82*   Estimated Creatinine Clearance: 20.3 mL/min (by C-G formula based on Cr of 3.82).    Allergies  Allergen Reactions  . Benadryl [Diphenhydramine Hcl]     "Restless leg syndrome"    Antimicrobials this admission: 12/17 >> Septra >> 12/20 12/20 >> levaquin >> 12/21 >> ganciclovir >> PTA >> fluconazole PTA >> acyclovir >> 12/21  Levels/dose changes this admission: --  Microbiology and serology results: 12/23 HSV DNA: ordered 12/23 HSV IgG: ordered 12/23 HSV IgM: ordered 12/24 VZV [id number] 12/24 VZV IgG: ordered 12/24 VZV IgM: ordered 12/21 MRSA PCR screen: Neg 12/21 CMV DNA:  pending 12/19 BAL culture: Normal flora 12/19 BAL tissue culture: re-incubated 12/19 BAL AFB: none seen, culture IP 12/19 CMV culture: pending 12/19 CMV DNA: "positive" at < 200 12/19 BAL fungal culture: no yeast seen, culture IP 12/19 BAL legionella culture: collected 12/19 BAL PCP Ag: neg 12/18 CMV DNA: Neg 12/17 Aspergillus: Neg 12/17 CMV IgM:  Negative 12/17 CMV IgG:  1.3 (increased) 12/17 CMV DNA:  Negative 12/17 CDiff negative 12/16 Influenza: Negative     Thank you for allowing pharmacy to be a part of this patient's care.  Joycelyn Rua PharmD, BCPS 12/14/2015 1:14 PM

## 2015-12-14 NOTE — Progress Notes (Signed)
New results: Aspergillus Ab negative  CMV DNA quant was negative 12/18, barely "positive" at <200 12/19, results from 12/20 pending.  Have added other viral titers but not sure how they would affect treatment as he is already on ganciclovir.   Tacrolimus levels are fine as of 12/20. Dose was decreased at that time, as steroids increased. Will follow levels but with current solumedrol dose graft vs host activation is not a concern and c/w that he has no rash and his usual loose stools have stopped.  Not sure what else we could be doing to help Mr Timothy [name] clear his pneumonitis. Doubt open lung biopsy would be of help.  Appreciate help of critical care and the other consultants on Mr Timothy [name]' behalf!

## 2015-12-14 NOTE — Progress Notes (Signed)
[facility] for Infectious Disease   Reason for visit: Follow up on hypoxia  Interval History: Has remained afebrile, tachypneic, hypoxic.  Continues to feel a bit better.     Physical Exam: Constitutional:  Filed Vitals:   12/14/15 1200 12/14/15 1224  BP: 159/92   Pulse: 78   Temp:  98 F (36.7 C)  Resp: 26    patient appears in NAD Respiratory: improved respiratory effort; CTA B, on venti mask Cardiovascular: Tachy RR GI: soft, nt  Review of Systems: Constitutional: negative for fevers and chills Musculoskeletal: negative for myalgias and arthralgias  Lab Results  Component Value Date   WBC 4.0 12/14/2015   HGB 10.2* 12/14/2015   HCT 30.1* 12/14/2015   MCV 88.0 12/14/2015   PLT 84* 12/14/2015    Lab Results  Component Value Date   CREATININE 3.82* 12/14/2015   BUN 121* 12/14/2015   NA 140 12/14/2015   K 3.9 12/14/2015   CL 104 12/14/2015   CO2 22 12/14/2015    Lab Results  Component Value Date   ALT 18 12/14/2015   AST 23 12/14/2015   ALKPHOS 90 12/14/2015     Microbiology: Recent Results (from the past 240 hour(s))  C difficile quick scan w PCR reflex     Status: None   Collection Time: 12/08/15  3:02 PM  Result Value Ref Range Status   C Diff antigen NEGATIVE NEGATIVE Final   C Diff toxin NEGATIVE NEGATIVE Final   C Diff interpretation Negative for toxigenic C. difficile  Final  AFB culture with smear     Status: None (Preliminary result)   Collection Time: 12/10/15 11:33 AM  Result Value Ref Range Status   Specimen Description BRONCHIAL ALVEOLAR LAVAGE  Final   Special Requests Immunocompromised  Final   Acid Fast Smear   Final    NO ACID FAST BACILLI SEEN Performed at [facility]    Culture   Final    CULTURE WILL BE EXAMINED FOR 6 WEEKS BEFORE ISSUING A FINAL REPORT Performed at [facility]    Report Status PENDING  Incomplete  Culture, bal-quantitative     Status: None   Collection Time: 12/10/15 11:35 AM    Result Value Ref Range Status   Specimen Description BRONCHIAL ALVEOLAR LAVAGE  Final   Special Requests Immunocompromised  Final   Gram Stain   Final    MODERATE WBC PRESENT, PREDOMINANTLY MONONUCLEAR RARE SQUAMOUS EPITHELIAL CELLS PRESENT NO ORGANISMS SEEN Performed at [facility]   Final    35,000 COLONIES/ML Performed at [facility]    Culture   Final    NORMAL OROPHARYNGEAL FLORA Performed at [facility]    Report Status 12/12/2015 FINAL  Final  Fungus Culture with Smear     Status: None (Preliminary result)   Collection Time: 12/10/15 11:38 AM  Result Value Ref Range Status   Specimen Description BRONCHIAL ALVEOLAR LAVAGE  Final   Special Requests Immunocompromised  Final   Fungal Smear   Final    NO YEAST OR FUNGAL ELEMENTS SEEN Performed at [facility]    Culture   Final    CULTURE IN PROGRESS FOR FOUR WEEKS Performed at [facility]    Report Status PENDING  Incomplete  Pneumocystis smear by DFA     Status: None   Collection Time: 12/10/15 11:39 AM  Result Value Ref Range Status   Specimen Source-PJSRC BRONCHIAL ALVEOLAR LAVAGE  Final   Pneumocystis jiroveci Ag NEGATIVE  Final    Comment: Performed at [facility]  Tissue culture     Status: None (Preliminary result)   Collection Time: 12/10/15 11:40 AM  Result Value Ref Range Status   Specimen Description BIOPSY LUNG TRANSBRONCHIAL  Final   Special Requests Immunocompromised  Final   Gram Stain   Final    NO WBC SEEN NO ORGANISMS SEEN Performed at [facility]    Culture   Final    Culture reincubated for better growth Note: Gram Stain Report Called to,Read Back By and Verified With: [facility] TORRES 12/10/15 @ 0707P YIMSU Performed at [facility]    Report Status PENDING  Incomplete  MRSA PCR Screening     Status: None   Collection Time: 12/12/15  9:46 AM  Result Value Ref Range Status   MRSA by PCR NEGATIVE  NEGATIVE Final    Comment:        The GeneXpert MRSA Assay (FDA approved for NASAL specimens only), is one component of a comprehensive MRSA colonization surveillance program. It is not intended to diagnose MRSA infection nor to guide or monitor treatment for MRSA infections.     Impression/Plan:  1. Airspace disease -  No etiology yet.  Likely infection, viral vs bacterial vs atypical.  Today's CXR seems a bit better compared to yesterday by my read.  Does not appear to be CMV but will continue with ganciclovir awaiting lung micro, possible HSV. Other studies sent.  No changes.  2.  CKD - creat noted.  Slight increase.     Dr. Megan Salon to follow up tomorrow.

## 2015-12-14 NOTE — Progress Notes (Signed)
Inpatient Diabetes Program Recommendations  AACE/ADA: New Consensus Statement on Inpatient Glycemic Control (2015)  Target Ranges:  Prepandial:   less than 140 mg/dL      Peak postprandial:   less than 180 mg/dL (1-2 hours)      Critically ill patients:  140 - 180 mg/dL    Results for Timothy [name], Timothy [name] (MRN [id number]) as of 12/14/2015 07:11  Ref. Range 12/13/2015 00:01 12/13/2015 04:05 12/13/2015 08:28 12/13/2015 12:07 12/13/2015 15:40 12/13/2015 20:07  Glucose-Capillary Latest Ref Range: 65-99 mg/dL 174 (H) 192 (H) 203 (H) 199 (H) 208 (H) 221 (H)    Home DM Meds: Tresiba (basal insulin)- 50 units daily       Novolog 10-20 units tid  Current Insulin Orders: Lantus 5 units daily      Novolog Resistant SSI (0-20 units) Q4 hours    -Patient currently getting IV Solumedrol 60 mg Q6 hours.  -Glucose levels consistently elevated.     MD- Please consider the following in-hospital insulin adjustments:  1. Change Novolog Resistant SSI (0-20 units) to TID AC + HS (since patient getting PO diet)- Currently ordered Q4 hours  2. Increase Lantus to 25 units daily (50% of home dose of basal insulin)      --Will follow patient during hospitalization--  Wyn Quaker RN, MSN, CDE Diabetes Coordinator Inpatient Glycemic Control Team Team Pager: [phone] (8a-5p)

## 2015-12-14 NOTE — Progress Notes (Signed)
Date: December 14, 2015 Chart reviewed for concurrent status and case management needs. Will continue to follow patient for changes and needs: Velva Harman, RN, BSN, [place]   [phone]

## 2015-12-15 ENCOUNTER — Other Ambulatory Visit: Payer: Self-pay

## 2015-12-15 DIAGNOSIS — R0902 Hypoxemia: Secondary | ICD-10-CM

## 2015-12-15 LAB — CBC
HCT: 30.5 % — ABNORMAL LOW (ref 39.0–52.0)
HEMOGLOBIN: 10.1 g/dL — AB (ref 13.0–17.0)
MCH: 29.5 pg (ref 26.0–34.0)
MCHC: 33.1 g/dL (ref 30.0–36.0)
MCV: 89.2 fL (ref 78.0–100.0)
PLATELETS: 89 10*3/uL — AB (ref 150–400)
RBC: 3.42 MIL/uL — AB (ref 4.22–5.81)
RDW: 15.9 % — ABNORMAL HIGH (ref 11.5–15.5)
WBC: 3.8 10*3/uL — ABNORMAL LOW (ref 4.0–10.5)

## 2015-12-15 LAB — TROPONIN I: Troponin I: 0.41 ng/mL — ABNORMAL HIGH (ref <0.031)

## 2015-12-15 LAB — COMPREHENSIVE METABOLIC PANEL
ALK PHOS: 90 U/L (ref 38–126)
ALT: 23 U/L (ref 17–63)
ANION GAP: 17 — AB (ref 5–15)
AST: 33 U/L (ref 15–41)
Albumin: 2.5 g/dL — ABNORMAL LOW (ref 3.5–5.0)
BILIRUBIN TOTAL: 0.3 mg/dL (ref 0.3–1.2)
BUN: 126 mg/dL — ABNORMAL HIGH (ref 6–20)
CALCIUM: 6.4 mg/dL — AB (ref 8.9–10.3)
CO2: 24 mmol/L (ref 22–32)
CREATININE: 3.7 mg/dL — AB (ref 0.61–1.24)
Chloride: 102 mmol/L (ref 101–111)
GFR, EST AFRICAN AMERICAN: 18 mL/min — AB (ref >60)
GFR, EST NON AFRICAN AMERICAN: 15 mL/min — AB (ref >60)
Glucose, Bld: 295 mg/dL — ABNORMAL HIGH (ref 65–99)
Potassium: 3.5 mmol/L (ref 3.5–5.1)
Sodium: 143 mmol/L (ref 135–145)
TOTAL PROTEIN: 5.4 g/dL — AB (ref 6.5–8.1)

## 2015-12-15 LAB — PHOSPHORUS: PHOSPHORUS: 6.2 mg/dL — AB (ref 2.5–4.6)

## 2015-12-15 LAB — VARICELLA ZOSTER ANTIBODY, IGG: Varicella IgG: 2720 index (ref >165)

## 2015-12-15 LAB — GLUCOSE, CAPILLARY
GLUCOSE-CAPILLARY: 262 mg/dL — AB (ref 65–99)
GLUCOSE-CAPILLARY: 267 mg/dL — AB (ref 65–99)
GLUCOSE-CAPILLARY: 188 mg/dL — AB (ref 65–99)
Glucose-Capillary: 210 mg/dL — ABNORMAL HIGH (ref 65–99)
Glucose-Capillary: 178 mg/dL — ABNORMAL HIGH (ref 65–99)
Glucose-Capillary: 179 mg/dL — ABNORMAL HIGH (ref 65–99)

## 2015-12-15 LAB — MAGNESIUM: MAGNESIUM: 1.6 mg/dL — AB (ref 1.7–2.4)

## 2015-12-15 LAB — HSV(HERPES SIMPLEX VRS) I + II AB-IGG
HSV 1 GLYCOPROTEIN G AB, IGG: 29.2 {index} — AB (ref 0.00–0.90)
HSV 2 GLYCOPROTEIN G AB, IGG: 4.21 {index} — AB (ref 0.00–0.90)

## 2015-12-15 MED ORDER — INSULIN GLARGINE 100 UNIT/ML ~~LOC~~ SOLN
30.0000 [IU] | Freq: Every day | SUBCUTANEOUS | Status: DC
  Administered 2015-12-16: 30 [IU] via SUBCUTANEOUS
  Filled 2015-12-15 (×2): qty 0.3

## 2015-12-15 MED ORDER — SODIUM CHLORIDE 0.9 % IV BOLUS (SEPSIS)
500.0000 mL | Freq: Once | INTRAVENOUS | Status: AC
  Administered 2015-12-15: 500 mL via INTRAVENOUS

## 2015-12-15 MED ORDER — SODIUM CHLORIDE 0.9 % IV SOLN
1.0000 g | Freq: Two times a day (BID) | INTRAVENOUS | Status: DC
  Administered 2015-12-15 – 2015-12-28 (×26): 1 g via INTRAVENOUS
  Filled 2015-12-15 (×26): qty 10

## 2015-12-15 MED ORDER — SODIUM CHLORIDE 0.9 % IV SOLN
1.0000 g | Freq: Once | INTRAVENOUS | Status: AC
  Administered 2015-12-15: 1 g via INTRAVENOUS
  Filled 2015-12-15: qty 10

## 2015-12-15 MED ORDER — INSULIN ASPART 100 UNIT/ML ~~LOC~~ SOLN
4.0000 [IU] | Freq: Once | SUBCUTANEOUS | Status: AC
  Administered 2015-12-15: 4 [IU] via SUBCUTANEOUS

## 2015-12-15 NOTE — Progress Notes (Signed)
[facility] ROUNDING NOTE   [name]:   Timothy [name]:  Timothy [name]   Objective:  Vital signs in last 24 hours:  Temp:  [97.4 F (36.3 C)-98.2 F (36.8 C)] 97.4 F (36.3 C) (12/24 0806) Pulse Rate:  [30-135] 132 (12/24 0647) Resp:  [18-32] 21 (12/24 0647) BP: (97-159)/(66-100) 119/72 mmHg (12/24 0622) SpO2:  [64 %-100 %] 99 % (12/24 0647) Weight:  [95.4 kg (210 lb 5.1 oz)] 95.4 kg (210 lb 5.1 oz) (12/24 0500)  Weight change: -2.4 kg (-5 lb 4.7 oz) Filed Weights   12/13/15 0421 12/14/15 0437 12/15/15 0500  Weight: 99.4 kg (219 lb 2.2 oz) 97.8 kg (215 lb 9.8 oz) 95.4 kg (210 lb 5.1 oz)    Intake/Output: I/O last 3 completed shifts: In: 2128.5 [P.O.:600; I.V.:1044.5; IV Piggyback:484] Out: 2125 [Urine:2125]   Intake/Output this shift:  Total I/O In: 300 [P.O.:300] Out: -   CVS- RRR RS- CTA  diminshed with rhonchi ABD- BS present soft non-distended EXT- trace edema   Basic Metabolic Panel:  Recent Labs Lab 12/11/15 0536 12/12/15 0550 12/13/15 0350 12/14/15 0430 12/15/15 0545  NA 139 136 137 140 143  K 4.9 5.1 4.8 3.9 3.5  CL 112* 107 107 104 102  CO2 15* 17* 17* 22 24  GLUCOSE 174* 346* 205* 213* 295*  Timothy 82* 91* 111* 121* 126*  CREATININE 3.31* 3.78* 3.65* 3.82* 3.70*  CALCIUM 7.8* 7.9* 7.4* 7.0* 6.4*  MG  --   --   --  1.6* 1.6*  PHOS 4.3 4.7* 5.0* 5.5* 6.2*    Liver Function Tests:  Recent Labs Lab 12/11/15 0536 12/12/15 0550 12/13/15 0350 12/14/15 0430 12/15/15 0545  AST 21 22 21 23  33  ALT 17 17 17 18 23   ALKPHOS 70 66 68 90 90  BILITOT 0.6 0.4 0.5 0.6 0.3  PROT 4.8* 6.1* 5.9* 6.0* 5.4*  ALBUMIN 2.6* 2.4* 2.4* 2.5* 2.5*   No results for input(s): LIPASE, AMYLASE in the last 168 hours. No results for input(s): AMMONIA in the last 168 hours.  CBC:  Recent Labs Lab 12/11/15 0536 12/12/15 0550 12/13/15 0350 12/14/15 0430 12/15/15 0545  WBC 3.2* 3.0* 3.8* 4.0 3.8*  HGB 10.9* 9.5*  9.8* 10.2* 10.1*  HCT 32.7* 28.3* 28.3* 30.1* 30.5*  MCV 90.3 89.3 87.6 88.0 89.2  PLT 116* 106* 106* 84* 89*    Cardiac Enzymes: No results for input(s): CKTOTAL, CKMB, CKMBINDEX, TROPONINI in the last 168 hours.  BNP: Invalid input(s): POCBNP  CBG:  Recent Labs Lab 12/14/15 1534 12/14/15 2001 12/15/15 0003 12/15/15 0422 12/15/15 0720  GLUCAP 204* 179* 210* 43* 267*    Microbiology: Results for orders placed or performed during the hospital encounter of 12/06/15  C difficile quick scan w PCR reflex     Status: None   Collection Time: 12/08/15  3:02 PM  Result Value Ref Range Status   C Diff antigen NEGATIVE NEGATIVE Final   C Diff toxin NEGATIVE NEGATIVE Final   C Diff interpretation Negative for toxigenic C. difficile  Final  AFB culture with smear     Status: None (Preliminary result)   Collection Time: 12/10/15 11:33 AM  Result Value Ref Range Status   Specimen Description BRONCHIAL ALVEOLAR LAVAGE  Final   Special Requests Immunocompromised  Final   Acid Fast Smear   Final    NO ACID FAST BACILLI SEEN Performed at [facility]    Culture   Final  CULTURE WILL BE EXAMINED FOR 6 WEEKS BEFORE ISSUING A FINAL REPORT Performed at [facility]    Report Status PENDING  Incomplete  Culture, bal-quantitative     Status: None   Collection Time: 12/10/15 11:35 AM  Result Value Ref Range Status   Specimen Description BRONCHIAL ALVEOLAR LAVAGE  Final   Special Requests Immunocompromised  Final   Gram Stain   Final    MODERATE WBC PRESENT, PREDOMINANTLY MONONUCLEAR RARE SQUAMOUS EPITHELIAL CELLS PRESENT NO ORGANISMS SEEN Performed at [place]   Final    35,000 COLONIES/ML Performed at [facility]    Culture   Final    NORMAL OROPHARYNGEAL FLORA Performed at [facility]    Report Status 12/12/2015 FINAL  Final  Fungus Culture with Smear     Status: None (Preliminary result)   Collection Time:  12/10/15 11:38 AM  Result Value Ref Range Status   Specimen Description BRONCHIAL ALVEOLAR LAVAGE  Final   Special Requests Immunocompromised  Final   Fungal Smear   Final    NO YEAST OR FUNGAL ELEMENTS SEEN Performed at [facility]    Culture   Final    CULTURE IN PROGRESS FOR FOUR WEEKS Performed at [facility]    Report Status PENDING  Incomplete  Pneumocystis smear by DFA     Status: None   Collection Time: 12/10/15 11:39 AM  Result Value Ref Range Status   Specimen Source-PJSRC BRONCHIAL ALVEOLAR LAVAGE  Final   Pneumocystis jiroveci Ag NEGATIVE  Final    Comment: Performed at [place] [facility]  Tissue culture     Status: None (Preliminary result)   Collection Time: 12/10/15 11:40 AM  Result Value Ref Range Status   Specimen Description BIOPSY LUNG TRANSBRONCHIAL  Final   Special Requests Immunocompromised  Final   Gram Stain   Final    NO WBC SEEN NO ORGANISMS SEEN Performed at [facility]    Culture   Final    Culture reincubated for better growth Note: Gram Stain Report Called to,Read Back By and Verified With: [facility] TORRES 12/10/15 @ 0707P YIMSU Performed at [facility]    Report Status PENDING  Incomplete  MRSA PCR Screening     Status: None   Collection Time: 12/12/15  9:46 AM  Result Value Ref Range Status   MRSA by PCR NEGATIVE NEGATIVE Final    Comment:        The GeneXpert MRSA Assay (FDA approved for NASAL specimens only), is one component of a comprehensive MRSA colonization surveillance program. It is not intended to diagnose MRSA infection nor to guide or monitor treatment for MRSA infections.    *Note: Due to a large number of results and/or encounters for the requested time period, some results have not been displayed. A complete set of results can be found in Results Review.    Coagulation Studies: No results for input(s): LABPROT, INR in the last 72 hours.  Urinalysis: No results for  input(s): COLORURINE, LABSPEC, PHURINE, GLUCOSEU, HGBUR, BILIRUBINUR, KETONESUR, PROTEINUR, UROBILINOGEN, NITRITE, LEUKOCYTESUR in the last 72 hours.  Invalid input(s): APPERANCEUR    Imaging: Dg Chest Port 1 View  12/14/2015  CLINICAL DATA:  Timothy failure. EXAM: PORTABLE CHEST - 1 VIEW COMPARISON:  One-view chest x-ray 12/13/2015. FINDINGS: The heart size is normal. The lung volumes remain low. A diffuse interstitial pattern is slightly increased compared to the prior study. Bibasilar airspace  [name] is evident. A right IJ Port-A-Cath is [name]. IMPRESSION: Slight increase in diffuse interstitial and airspace pattern compatible with edema and pneumonia. Electronically Signed   By: San Morelle M.D.   On: 12/14/2015 07:31     Medications:   . diltiazem (CARDIZEM) infusion 10 mg/hr (12/15/15 0730)  .  sodium bicarbonate  infusion 1000 mL 30 mL/hr at 12/15/15 0759   . sodium chloride   Intravenous Once  . antiseptic oral rinse  7 mL Mouth Rinse BID  . budesonide  3 mg Oral TID  . calcium gluconate  1 g Intravenous BID  . cholestyramine  4 g Oral BID  . feeding supplement (GLUCERNA SHAKE)  237 mL Oral BID BM  . Timothy  100 mg Oral Daily  . ganciclovir (CYTOVENE) IV  1.25 mg/kg Intravenous Q24H  . insulin aspart  0-20 Units Subcutaneous TID AC & HS  . insulin glargine  25 Units Subcutaneous Daily  . labetalol  300 mg Oral BID  . levofloxacin  750 mg Oral Q48H  . loratadine  10 mg Oral Daily  . methylPREDNISolone (SOLU-MEDROL) injection  500 mg Intravenous Q12H  . pantoprazole  40 mg Oral Daily  . sertraline  50 mg Oral q morning - 10a  . sodium bicarbonate  1,300 mg Oral TID  . sodium chloride  10-40 mL Intracatheter Q12H  . tacrolimus  1 mg Oral BID   HYDROcodone-acetaminophen, hydrocortisone, LORazepam, menthol-cetylpyridinium, morphine injection, ondansetron (ZOFRAN) IV, prochlorperazine, sodium chloride, technetium TC 24M diethylenetriame-pentaacetic  acid  Assessment/ Plan:   Timothy [name] Creatinine [name]    Timothy [name]  Timothy [name]  Timothy [name] Timothy [name], Timothy [name]  Timothy [name]  Timothy [name]  Timothy [name]   Timothy [name] probable etiology  No evidence of GI bleed    LOS: 9 Ameerah Huffstetler W @TODAY @9 :31 AM

## 2015-12-15 NOTE — Progress Notes (Signed)
Timothy [name]   DOB:04/19/1946   PJ#:[id number]   FYT#:[id number]  Subjective: Timothy [name] tells me his breathing is better-- less laored, and recovers quicker after any effort. On the negative side he had a run of SVT last night--resolved. Had 5 loose BMs past 24 hours. Discussing how much physical therapy he is ging to need once he improves enough to undergo it; no family in room  Objective: middle aged White male examined in bed  Filed Vitals:   12/15/15 0647 12/15/15 0806  BP:    Pulse: 132   Temp:  97.4 F (36.3 C)  Resp: 21     Body mass index is 32.93 kg/(m^2).  Intake/Output Summary (Last 24 hours) at 12/15/15 0905 Last data filed at 12/15/15 1771  Gross per 24 hour  Intake 1582.5 ml  Output   1125 ml  Net  457.5 ml   Mouth slightly dry, no thrush or lesions Distant breath sounds auscultated anterolaterally abd soft, NT, +BS RRR, no murmur   CBG (last 3)   Recent Labs  12/15/15 0003 12/15/15 0422 12/15/15 0720  GLUCAP 210* 262* 267*     Labs:  Lab Results  Component Value Date   WBC 3.8* 12/15/2015   HGB 10.1* 12/15/2015   HCT 30.5* 12/15/2015   MCV 89.2 12/15/2015   PLT 89* 12/15/2015   NEUTROABS 4.4 12/06/2015    _0 @  Urine Studies No results for input(s): UHGB, CRYS in the last 72 hours.  Invalid input(s): UACOL, UAPR, USPG, UPH, UTP, UGL, UKET, UBIL, UNIT, UROB, [place], UEPI, UWBC, [place], [place]  Basic Metabolic Panel:  Recent Labs Lab 12/11/15 0536 12/12/15 0550 12/13/15 0350 12/14/15 0430 12/15/15 0545  NA 139 136 137 140 143  K 4.9 5.1 4.8 3.9 3.5  CL 112* 107 107 104 102  CO2 15* 17* 17* 22 24  GLUCOSE 174* 346* 205* 213* 295*  BUN 82* 91* 111* 121* 126*  CREATININE 3.31* 3.78* 3.65* 3.82* 3.70*  CALCIUM 7.8* 7.9* 7.4* 7.0* 6.4*  MG  --   --   --  1.6* 1.6*  PHOS 4.3 4.7* 5.0* 5.5* 6.2*   GFR Estimated Creatinine Clearance: 20.7 mL/min (by C-G formula based on Cr of 3.7). Liver Function Tests:  Recent  Labs Lab 12/11/15 0536 12/12/15 0550 12/13/15 0350 12/14/15 0430 12/15/15 0545  AST _1 33  ALT _2 ALKPHOS 70 66 68 90 90  BILITOT 0.6 0.4 0.5 0.6 0.3  PROT 4.8* 6.1* 5.9* 6.0* 5.4*  ALBUMIN 2.6* 2.4* 2.4* 2.5* 2.5*   No results for input(s): LIPASE, AMYLASE in the last 168 hours. No results for input(s): AMMONIA in the last 168 hours. Coagulation profile  Recent Labs Lab 12/10/15 0745  INR 1.21    CBC:  Recent Labs Lab 12/11/15 0536 12/12/15 0550 12/13/15 0350 12/14/15 0430 12/15/15 0545  WBC 3.2* 3.0* 3.8* 4.0 3.8*  HGB 10.9* 9.5* 9.8* 10.2* 10.1*  HCT 32.7* 28.3* 28.3* 30.1* 30.5*  MCV 90.3 89.3 87.6 88.0 89.2  PLT 116* 106* 106* 84* 89*   Cardiac Enzymes: No results for input(s): CKTOTAL, CKMB, CKMBINDEX, TROPONINI in the last 168 hours. BNP: Invalid input(s): POCBNP CBG:  Recent Labs Lab 12/14/15 1534 12/14/15 2001 12/15/15 0003 12/15/15 0422 12/15/15 0720  GLUCAP 204* 179* 210* 262* 267*   D-Dimer No results for input(s): DDIMER in the last 72 hours. Hgb A1c No results for input(s): HGBA1C in the last 72 hours. Lipid Profile  No results for input(s): CHOL, HDL, LDLCALC, TRIG, CHOLHDL, LDLDIRECT in the last 72 hours. Thyroid function studies No results for input(s): TSH, T4TOTAL, T3FREE, THYROIDAB in the last 72 hours.  Invalid input(s): FREET3 Anemia work up No results for input(s): VITAMINB12, FOLATE, FERRITIN, TIBC, IRON, RETICCTPCT in the last 72 hours. Microbiology Recent Results (from the past 240 hour(s))  C difficile quick scan w PCR reflex     Status: None   Collection Time: 12/08/15  3:02 PM  Result Value Ref Range Status   C Diff antigen NEGATIVE NEGATIVE Final   C Diff toxin NEGATIVE NEGATIVE Final   C Diff interpretation Negative for toxigenic C. difficile  Final  AFB culture with smear     Status: None (Preliminary result)   Collection Time: 12/10/15 11:33 AM  Result Value Ref Range Status   Specimen  Description BRONCHIAL ALVEOLAR LAVAGE  Final   Special Requests Immunocompromised  Final   Acid Fast Smear   Final    NO ACID FAST BACILLI SEEN Performed at [facility]    Culture   Final    CULTURE WILL BE EXAMINED FOR 6 WEEKS BEFORE ISSUING A FINAL REPORT Performed at [facility]    Report Status PENDING  Incomplete  Culture, bal-quantitative     Status: None   Collection Time: 12/10/15 11:35 AM  Result Value Ref Range Status   Specimen Description BRONCHIAL ALVEOLAR LAVAGE  Final   Special Requests Immunocompromised  Final   Gram Stain   Final    MODERATE WBC PRESENT, PREDOMINANTLY MONONUCLEAR RARE SQUAMOUS EPITHELIAL CELLS PRESENT NO ORGANISMS SEEN Performed at [place]   Final    35,000 COLONIES/ML Performed at [facility]    Culture   Final    NORMAL OROPHARYNGEAL FLORA Performed at [facility]    Report Status 12/12/2015 FINAL  Final  Fungus Culture with Smear     Status: None (Preliminary result)   Collection Time: 12/10/15 11:38 AM  Result Value Ref Range Status   Specimen Description BRONCHIAL ALVEOLAR LAVAGE  Final   Special Requests Immunocompromised  Final   Fungal Smear   Final    NO YEAST OR FUNGAL ELEMENTS SEEN Performed at [facility]    Culture   Final    CULTURE IN PROGRESS FOR FOUR WEEKS Performed at [facility]    Report Status PENDING  Incomplete  Pneumocystis smear by DFA     Status: None   Collection Time: 12/10/15 11:39 AM  Result Value Ref Range Status   Specimen Source-PJSRC BRONCHIAL ALVEOLAR LAVAGE  Final   Pneumocystis jiroveci Ag NEGATIVE  Final    Comment: Performed at [place] [facility]  Tissue culture     Status: None (Preliminary result)   Collection Time: 12/10/15 11:40 AM  Result Value Ref Range Status   Specimen Description BIOPSY LUNG TRANSBRONCHIAL  Final   Special Requests Immunocompromised  Final   Gram Stain   Final    NO WBC  SEEN NO ORGANISMS SEEN Performed at [facility]    Culture   Final    Culture reincubated for better growth Note: Gram Stain Report Called to,Read Back By and Verified With: [facility] TORRES 12/10/15 @ 0707P YIMSU Performed at [facility]    Report Status PENDING  Incomplete  MRSA PCR Screening     Status: None   Collection Time: 12/12/15  9:46 AM  Result Value  Ref Range Status   MRSA by PCR NEGATIVE NEGATIVE Final    Comment:        The GeneXpert MRSA Assay (FDA approved for NASAL specimens only), is one component of a comprehensive MRSA colonization surveillance program. It is not intended to diagnose MRSA infection nor to guide or monitor treatment for MRSA infections.     Studies:  Dg Chest Port 1 View  12/14/2015  CLINICAL DATA:  Respiratory failure. EXAM: PORTABLE CHEST - 1 VIEW COMPARISON:  One-view chest x-ray 12/13/2015. FINDINGS: The heart size is normal. The lung volumes remain low. A diffuse interstitial pattern is slightly increased compared to the prior study. Bibasilar airspace disease is evident. A right IJ Port-A-Cath is stable. IMPRESSION: Slight increase in diffuse interstitial and airspace pattern compatible with edema and pneumonia. Electronically Signed   By: San Morelle M.D.   On: 12/14/2015 07:31    Assessment: 69 y.o. [place] man with a history of well-differentiated lymphocytic lymphoma/ chronic lymphoid leukemia initially diagnosed in 2000, not requiring intervention until 2006; s/p allogeneic transplant March 2013, now admitted with worsening SOB, bilateral pulmonary infiltrates and acute on chronic kidney injury, but no fever, mucositis, or rash. His CLL history is as follows:  (1) fludarabine/cyclophosphamide/rituximab x5 completed May 2007.   (2) rituximab for 8 doses October 2010, with partial response   (3) Leustatin and ofatumumab weekly x8 July to September 2011 followed by maintenance ofatumumab every 2 months,  with initial response but rising counts September 2012   (4) status-post unrelated donor stem-cell transplant 02/24/2012 at [facility]  (a) conditioning regimen consisted of fludarabine + TBI at 200 cGy, followed by rituximab x27;  (b) CMV reactivation x3 (patient CMV positive, donor negative), s/p ganciclovir treatment; 3d reactivation August 2013, s/p gancyclovir, with negative PCR mid-September 2013; last gancyclovir dose 10/06/2012 (c) Chronic GVHD: involving gut and skin, treated with steroids, tacrolimus and MMF. MMF was eventually d/c'd and tacrolimus currently at a dose of 1.61m BID (d) atrial fibrillation: resolved on brief amiodarone regimen (e) steroid-induced myopathy: improving  (f) hypomagnesemia: improved after d/c gancyclovir, needs continuing support (g) hypogammaglobulinemia: requiring IVIG most recently 08/03/2014. (h) history of elevated triglycerides (606 on 07/14/2012)  (i) adrenal insufficiency: on prednisone and budesonide (j) pancytopenia,resolved (k) brief episode of neutropenia ([place]) February 2015, accompanied by diarrhea; resolved   (5) restaging studies February-March 2015 including CT scans, flow cytometry, and bone marrow biopsy, showed no evidence of residual chronic lymphoid leukemia. (a) repeat bone marrow biopsy 02/27/2015 showed no evidence of chronic lymphoid leukemia and also no dyspoiesis. Flow cytometry showed no B cells.  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 Status post kyphoplasty at L3-4 in June 2014. Also with evidence of rib fractures and insufficiency fractures bilaterally of the sacral alae, noted by CT in March 2015. -- Denosumab started 12/08/2013, given as prolia Q6 months which is what has been approved by his insurance, most recent dose 03/29/2015  (12) chronic back pain and hip pain controlled with OxyContin and hydrocodone/APAP.  (12) nausea: well controlled on current meds  (13) Positive c.diff, 03/08/2013, on  Flagyl 500 mg TID x 20 days, then on oral vanco with Questran, showing improvement; positive when repeated April 2014; Negative x 3 since then; repeat 12/07/2015 again negative  (14) persistently increased BUN and potassium: Improved on daily bicarbonate  (15) Hypertension, on labetalol, cardizem, lisinopril, and furosemide; managed by Dr. Brigitte Pulse  (16) steroid induced hyperglycemia/ DM II: managed by Dr Brigitte Pulse; was severely hypoglycemic on admission, currently on SSI   (17) hypogammaglobulinemia-- requiring intermittent supplementation, most recent dose 10/18/2015  (18) squamous cell CA in situ removed from left parietal scalp October 2014  (19) severe SOB with pulmonary infiltrates: workup so far has included   (a) echo 12/06/2015 shows EF 45-50% and no wall motion abnormalities  (b) EKG shows new (c/w 2014) left fascicular block, old RBBB, no ischemic changes  (c) troponin is minimally and persistently elevated, not c/w MI  (d) V/Q scan 12/07/2015 low probability  (e) negative influenza panel  (f) .CMV IgG positive but IgM negative, quant DNA neg/low  (g) PCP screen from BAL negative  (h) IgG level <400-- replaced 12/11/2015    Plan:  Clinically appears to be responding to higher doses of steroids.  Multiple loose BMs is not unusual for him but he has had c difficile in past, will re-check pcr  Given episode of SVR will resume Ca++ supplementation  Repeat tacrolimus levels  pending  I will be out of town until 12/24/2014-- my partner Dr Lindi Adie will follow.  Againm greatly appreciate PUL/ID/Renal consultants' continuing help!     Chauncey Cruel, MD 12/15/2015  9:05 AM Medical Oncology and Hematology [facility] [address] [place], [place] [address] Tel. [phone]    Fax. [phone]

## 2015-12-15 NOTE — Progress Notes (Signed)
CRITICAL VALUE ALERT  Critical value received:  Calcium 6.4  Date of notification:  12/15/2015  Time of notification:  0635  Critical value read back:Yes.    Nurse who received alert:  [id number]  MD notified (1st page):  Tom callahan  Time of first page:  [phone]  MD notified (2nd page):  Time of second page:  Responding MD:  Kathline Magic  Time MD responded:  [phone]

## 2015-12-15 NOTE — Progress Notes (Addendum)
Name: Timothy [name] MRN: [id number] DOB: 06/16/1946    ADMISSION DATE:  12/06/2015 CONSULTATION DATE:  12/07/15  REFERRING MD :  Dr. 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    SIGNIFICANT EVENTS  12/15  Admit from [facility] with multiple c/o's - more diarrhea, SOB, slurred speech, low blood sugars 12/19 FOB>>>  MICRO:  BAL 12/19>>> normal flora  AFB 12/19>>> smear NEG  PCP 12/19>>> NEG  CMV 12/19>>>CMV IgG positive but IgM negative Legionella 12/19>>> FOB tissue culture 12/19>>>  STUDIES:  12/15  CT Chest >> bilateral airspace & ground glass opacities, atherosclerosis in coronary arteries, cardiomegaly, small R pleural effusion, non-specific gallbladder distention, trace perihepatic ascites  12/15  ECHO >> LV with mod to severe LVH, mild reduction of systolic fxn, EF Q000111Q, No RWMA, grade 1 diastolic dysfunction, mild mitral regurg, mild aortic regurg 12/16  VQ >> LOW prob PE  ABX:  Bactrim 12/17>>>12/20 Acyclovir 12/15>>>12/21 Ganciclovir 12/21>>> Diflucan 12/15>>> Levaquin 12/20>>>   SUBJECTIVE: No issues overnight. States that breathing is better. Sinus tachycardia.  VITAL SIGNS: Temp:  [97.4 F (36.3 C)-98.2 F (36.8 C)] 97.4 F (36.3 C) (12/24 0806) Pulse Rate:  [30-135] 132 (12/24 0647) Resp:  [18-32] 21 (12/24 0647) BP: (97-159)/(66-100) 119/72 mmHg (12/24 0622) SpO2:  [64 %-100 %] 99 % (12/24 0647) Weight:  [210 lb 5.1 oz (95.4 kg)] 210 lb 5.1 oz (95.4 kg) (12/24 0500)  PHYSICAL EXAMINATION: General:  Chronically ill appearing male, comfortable on [place] Neuro:  AAO x 4, speech clear, MAE HEENT:  MM pink/moist, no jvd  Cardiovascular: HR regular at 87bpm,  s1/s2 regular, +2 peripheral pulses    Lungs:  Even/non-labored on [place], diminished bases, few bibasilar crackles.    Abdomen:  Round/soft, bsx4 active, non-tender  Musculoskeletal:  Warm and dry, 2+ BLE edema SKIN: Bruising in BLUE    Recent Labs Lab 12/13/15 0350 12/14/15 0430 12/15/15 0545  NA 137 140 143  K 4.8 3.9 3.5  CL 107 104 102  CO2 17* 22 24  BUN 111* 121* 126*  CREATININE 3.65* 3.82* 3.70*  GLUCOSE 205* 213* 295*    Recent Labs Lab 12/13/15 0350 12/14/15 0430 12/15/15 0545  HGB 9.8* 10.2* 10.1*  HCT 28.3* 30.1* 30.5*  WBC 3.8* 4.0 3.8*  PLT 106* 84* 89*   Dg Chest Port 1 View  12/14/2015  CLINICAL DATA:  Respiratory failure. EXAM: PORTABLE CHEST - 1 VIEW COMPARISON:  One-view chest x-ray 12/13/2015. FINDINGS: The heart size is normal. The lung volumes remain low. A diffuse interstitial pattern is slightly increased compared to the prior study. Bibasilar airspace disease is evident. A right IJ Port-A-Cath is stable. IMPRESSION: Slight increase in diffuse interstitial and airspace pattern compatible with edema and pneumonia. Electronically Signed   By: San Morelle M.D.   On: 12/14/2015 07:31   DISCUSSION: [name] is a 69 year old with CLL status post allogenic stem cell transplant, graft versus host disease affecting skin, gut, CMV reactivation, immunosuppression admitted with dyspnea, bilateral infiltrates on CT scan. VQ scan is low probability for PE. S/p FOB on 12/19. All studies to date are negative. Transferred to ICU for worsening hypoxemia.   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  ASSESSMENT / PLAN:  PULMONARY Bilateral Infiltrates / Ground Glass Opacities - differential broad in immunocompromised host - likely infectious - viral v bacterial v  atypical; includes bacterial / viral illness as patient is high risk for opportunistic infection.  DFA for PCP neg and bactrim stopped 12/20. S/p FOB 12/19.  Worsening infiltrates 12/23 on CXR.  Small Right Pleural Effusion  Acute Hypoxic Respiratory Failure - Improved hypoxia Plan: Abx per ID as above  Continue bipap at HS and [place] as tolerated and titrate FiO2 too O2 sat>90% F/u CXR  Pulm hygiene per protocol F/u path, FOB cultures  Cont solumedrol 500 mg q12  CARDIOVASCULAR Hx HTN  AFib  Volume overload  Intermittent bradycardia-HR stable in the 70s P:  Monitor HR for bradycardia  Hold Lasix given the hypotension and sinus tachycardia. We will give back some fluids.  RENAL AKI on CKD-Remains net positive on I/O but urine output improving Hypomagnesemia  P:   F/u chem and magnesium level PO bicarb  KVO HCO3 gtt  Avoid nephrotoxic medications  GASTROINTESTINAL Nausea without vomiting P:   Advance feeds as tolerated GI prophylaxis Zofran prn  HEMATOLOGIC CLL  Chronic Graft vs Host Disease  P:  Continue solumedrol, acyclovir, prograf and diflucan Plan per Dr. Jana [name]   INFECTIOUS Bilateral pulmonary infiltrates - presumed infectious  P:   Abx as above per ID  CMV negative to date.  ENDOCRINE Relative adrenal insufficiency Hyperglycemia  P:   Cont steroids  SSI  Increase lantus to 30U  NEUROLOGIC Hx depression, anxiety  Chronic pain  P:   PRN morphine  Cont zoloft, PRN ativan   FAMILY  - Updates:  Pt updated at bedside.  Critical care- 45 mins.  Timothy Garfinkel MD [facility] Pager [phone] If no answer or after 3pm call: [phone] 12/15/2015, 12:50 PM

## 2015-12-15 NOTE — Progress Notes (Signed)
Patient ID: Timothy [name], male   DOB: 1946-06-18, 69 y.o.   MRN: [id number]         [facility]    Date of Admission:  12/06/2015           Day 10 fluconazole        Day 5 levofloxacin        Day 4 ganciclovir  Active Problems:   Shortness of breath   PNA (pneumonia)   Pulmonary infiltrate   Acute respiratory failure ([place])   . sodium chloride   Intravenous Once  . antiseptic oral rinse  7 mL Mouth Rinse BID  . budesonide  3 mg Oral TID  . calcium gluconate  1 g Intravenous BID  . cholestyramine  4 g Oral BID  . feeding supplement (GLUCERNA SHAKE)  237 mL Oral BID BM  . fluconazole  100 mg Oral Daily  . ganciclovir (CYTOVENE) IV  1.25 mg/kg Intravenous Q24H  . insulin aspart  0-20 Units Subcutaneous TID AC & HS  . insulin glargine  25 Units Subcutaneous Daily  . labetalol  300 mg Oral BID  . levofloxacin  750 mg Oral Q48H  . loratadine  10 mg Oral Daily  . methylPREDNISolone (SOLU-MEDROL) injection  500 mg Intravenous Q12H  . pantoprazole  40 mg Oral Daily  . sertraline  50 mg Oral q morning - 10a  . sodium bicarbonate  1,300 mg Oral TID  . sodium chloride  10-40 mL Intracatheter Q12H  . tacrolimus  1 mg Oral BID    SUBJECTIVE: He feels less short of breath today. He is having a little bit more cough that is nonproductive.  Review of Systems: Review of Systems  Constitutional: Positive for malaise/fatigue. Negative for fever, chills and diaphoresis.  Respiratory: Positive for cough and shortness of breath. Negative for hemoptysis and sputum production.   Cardiovascular: Negative for chest pain.  Gastrointestinal: Positive for diarrhea. Negative for nausea, vomiting and abdominal pain.    Past Medical History  Diagnosis Date  . 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 Diverticular disease   . Hyperlipidemia   . Obesity   . Hypertension   . Hiatal hernia   . CMV (cytomegalovirus) antibody positive     pre-transplant, with seroconversion  x2 pst-transplant  . Right bundle branch block     pre-transplant  . CKD (chronic kidney disease) stage 2, GFR 60-89 ml/min   . Pancytopenia ([place])   . Atrial fibrillation ([place])     post-transplant  . Myopathy   . 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 Chronic GVHD complicating bone marrow transplantation ([place]) 12/05/2012  . Diarrhea in adult patient 12/05/2012    Due to active GVHD  . Rash of face 12/05/2012    Due to GVHD  . Hypomagnesemia 01/26/2013  . Left hip pain 12/01/2013  . Steroid-induced diabetes ([place])     novalog  . Leukemia, chronic lymphoid ([place])   . CLL (chronic lymphocytic leukemia) ([place]) 12/05/2012    Dx 07/1999; started Rx 12/06  AlloBMT 3/13    Social History  Substance Use Topics  . Smoking status: Never Smoker   . Smokeless tobacco: Never Used  . Alcohol Use: No    Family History  Problem Relation Age of Onset  . Cancer Father    Allergies  Allergen Reactions  . Benadryl [Diphenhydramine Hcl]     "Restless leg syndrome"    OBJECTIVE: Filed Vitals:   12/15/15 0500  12/15/15 0622 12/15/15 0647 12/15/15 0806  BP: 135/66 119/72    Pulse: 73 112 132   Temp:    97.4 F (36.3 C)  TempSrc:      Resp: 25 21 21    Height:      Weight: 210 lb 5.1 oz (95.4 kg)     SpO2: 96% 99% 99%    Body mass index is 32.93 kg/(m^2).  Physical Exam  Constitutional:  He is alert and in no distress.  HENT:  Mouth/Throat: No oropharyngeal exudate.  Cardiovascular: Regular rhythm.   No murmur heard. Tachycardic.  Pulmonary/Chest: Effort normal. He has no rales.  Clear lungs anteriorly.  Abdominal: Soft. Bowel sounds are normal. There is no tenderness.  Skin:  Scattered ecchymoses.  Psychiatric: Mood and affect normal.    Lab Results Lab Results  Component Value Date   WBC 3.8* 12/15/2015   HGB 10.1* 12/15/2015   HCT 30.5* 12/15/2015   MCV 89.2 12/15/2015   PLT 89* 12/15/2015    Lab Results  Component Value Date   CREATININE 3.70*  12/15/2015   BUN 126* 12/15/2015   NA 143 12/15/2015   K 3.5 12/15/2015   CL 102 12/15/2015   CO2 24 12/15/2015    Lab Results  Component Value Date   ALT 23 12/15/2015   AST 33 12/15/2015   ALKPHOS 90 12/15/2015   BILITOT 0.3 12/15/2015     Microbiology: Recent Results (from the past 240 hour(s))  C difficile quick scan w PCR reflex     Status: None   Collection Time: 12/08/15  3:02 PM  Result Value Ref Range Status   C Diff antigen NEGATIVE NEGATIVE Final   C Diff toxin NEGATIVE NEGATIVE Final   C Diff interpretation Negative for toxigenic C. difficile  Final  AFB culture with smear     Status: None (Preliminary result)   Collection Time: 12/10/15 11:33 AM  Result Value Ref Range Status   Specimen Description BRONCHIAL ALVEOLAR LAVAGE  Final   Special Requests Immunocompromised  Final   Acid Fast Smear   Final    NO ACID FAST BACILLI SEEN Performed at [name]    Culture   Final    CULTURE WILL BE EXAMINED FOR 6 WEEKS BEFORE ISSUING A FINAL REPORT Performed at [name]    Report Status PENDING  Incomplete  Culture, bal-quantitative     Status: None   Collection Time: 12/10/15 11:35 AM  Result Value Ref Range Status   Specimen Description BRONCHIAL ALVEOLAR LAVAGE  Final   Special Requests Immunocompromised  Final   Gram Stain   Final    MODERATE WBC PRESENT, PREDOMINANTLY MONONUCLEAR RARE SQUAMOUS EPITHELIAL CELLS PRESENT NO ORGANISMS SEEN Performed at [facility]   Final    35,000 COLONIES/ML Performed at [name]    Culture   Final    NORMAL OROPHARYNGEAL FLORA Performed at [name]    Report Status 12/12/2015 FINAL  Final  Fungus Culture with Smear     Status: None (Preliminary result)   Collection Time: 12/10/15 11:38 AM  Result Value Ref Range Status   Specimen Description BRONCHIAL ALVEOLAR LAVAGE  Final   Special Requests Immunocompromised  Final   Fungal Smear   Final     NO YEAST OR FUNGAL ELEMENTS SEEN Performed at [name]    Culture   Final    CULTURE IN PROGRESS FOR FOUR WEEKS Performed at [facility]    Report Status PENDING  Incomplete  Pneumocystis smear by DFA     Status: None   Collection Time: 12/10/15 11:39 AM  Result Value Ref Range Status   Specimen Source-[facility] BRONCHIAL ALVEOLAR LAVAGE  Final   Pneumocystis jiroveci Ag NEGATIVE  Final    Comment: Performed at [facility]  Tissue culture     Status: None (Preliminary result)   Collection Time: 12/10/15 11:40 AM  Result Value Ref Range Status   Specimen Description BIOPSY LUNG TRANSBRONCHIAL  Final   Special Requests Immunocompromised  Final   Gram Stain   Final    NO WBC SEEN NO ORGANISMS SEEN Performed at [name]    Culture   Final    Culture reincubated for better growth Note: Gram Stain Report Called to,Read Back By and Verified With: [facility] TORRES 12/10/15 @ 0707P YIMSU Performed at [name]    Report Status PENDING  Incomplete  MRSA PCR Screening     Status: None   Collection Time: 12/12/15  9:46 AM  Result Value Ref Range Status   MRSA by PCR NEGATIVE NEGATIVE Final    Comment:        The GeneXpert MRSA Assay (FDA approved for NASAL specimens only), is one component of a comprehensive MRSA colonization surveillance program. It is not intended to diagnose MRSA infection nor to guide or monitor treatment for MRSA infections.      ASSESSMENT: The cause of his bilateral infiltrates and hypoxia remains unclear. All BAL stains and cultures remain negative. He is improving coincident with an increase in his steroids. I will continue current antimicrobial therapy for now.  PLAN: 1. Continue current antimicrobial therapy  Michel Bickers, MD [facility] [place] [phone] pager   [phone] cell 12/15/2015, 10:25 AM

## 2015-12-16 ENCOUNTER — Inpatient Hospital Stay (HOSPITAL_COMMUNITY): Payer: BC Managed Care – PPO

## 2015-12-16 DIAGNOSIS — J9601 Acute respiratory failure with hypoxia: Secondary | ICD-10-CM

## 2015-12-16 LAB — CBC
HCT: 30.6 % — ABNORMAL LOW (ref 39.0–52.0)
Hemoglobin: 10.1 g/dL — ABNORMAL LOW (ref 13.0–17.0)
MCH: 29.4 pg (ref 26.0–34.0)
MCHC: 33 g/dL (ref 30.0–36.0)
MCV: 89.2 fL (ref 78.0–100.0)
PLATELETS: 94 10*3/uL — AB (ref 150–400)
RBC: 3.43 MIL/uL — AB (ref 4.22–5.81)
RDW: 15.8 % — ABNORMAL HIGH (ref 11.5–15.5)
WBC: 3.5 10*3/uL — AB (ref 4.0–10.5)

## 2015-12-16 LAB — GLUCOSE, CAPILLARY
GLUCOSE-CAPILLARY: 173 mg/dL — AB (ref 65–99)
GLUCOSE-CAPILLARY: 199 mg/dL — AB (ref 65–99)
Glucose-Capillary: 305 mg/dL — ABNORMAL HIGH (ref 65–99)
Glucose-Capillary: 174 mg/dL — ABNORMAL HIGH (ref 65–99)
Glucose-Capillary: 152 mg/dL — ABNORMAL HIGH (ref 65–99)

## 2015-12-16 LAB — COMPREHENSIVE METABOLIC PANEL
ALK PHOS: 89 U/L (ref 38–126)
ALT: 25 U/L (ref 17–63)
ANION GAP: 16 — AB (ref 5–15)
AST: 37 U/L (ref 15–41)
Albumin: 2.4 g/dL — ABNORMAL LOW (ref 3.5–5.0)
BUN: 111 mg/dL — ABNORMAL HIGH (ref 6–20)
CALCIUM: 6.5 mg/dL — AB (ref 8.9–10.3)
CHLORIDE: 105 mmol/L (ref 101–111)
CO2: 25 mmol/L (ref 22–32)
CREATININE: 3.49 mg/dL — AB (ref 0.61–1.24)
GFR, EST AFRICAN AMERICAN: 19 mL/min — AB (ref >60)
GFR, EST NON AFRICAN AMERICAN: 16 mL/min — AB (ref >60)
Glucose, Bld: 179 mg/dL — ABNORMAL HIGH (ref 65–99)
Potassium: 3.2 mmol/L — ABNORMAL LOW (ref 3.5–5.1)
Sodium: 146 mmol/L — ABNORMAL HIGH (ref 135–145)
Total Bilirubin: 0.6 mg/dL (ref 0.3–1.2)
Total Protein: 5.5 g/dL — ABNORMAL LOW (ref 6.5–8.1)

## 2015-12-16 LAB — TISSUE CULTURE: Gram Stain: NONE SEEN

## 2015-12-16 LAB — MAGNESIUM: Magnesium: 1.5 mg/dL — ABNORMAL LOW (ref 1.7–2.4)

## 2015-12-16 LAB — PHOSPHORUS: PHOSPHORUS: 5.4 mg/dL — AB (ref 2.5–4.6)

## 2015-12-16 LAB — TACROLIMUS LEVEL: TACROLIMUS (FK506) - LABCORP: 22.1 ng/mL — AB (ref 2.0–20.0)

## 2015-12-16 LAB — TROPONIN I
TROPONIN I: 0.5 ng/mL — AB (ref <0.031)
Troponin I: 0.52 ng/mL (ref <0.031)

## 2015-12-16 MED ORDER — POTASSIUM CHLORIDE 20 MEQ/15ML (10%) PO SOLN: 40.0000 meq | Freq: Two times a day (BID) | ORAL | Status: DC

## 2015-12-16 MED ORDER — MAGNESIUM GLUCONATE 500 MG PO TABS
500.0000 mg | ORAL_TABLET | Freq: Once | ORAL | Status: AC
  Administered 2015-12-16: 500 mg via ORAL
  Filled 2015-12-16: qty 1

## 2015-12-16 MED ORDER — MAGNESIUM CITRATE PO SOLN: 1.0000 | Freq: Once | ORAL | Status: DC

## 2015-12-16 MED ORDER — POTASSIUM CHLORIDE 20 MEQ/15ML (10%) PO SOLN
40.0000 meq | Freq: Once | ORAL | Status: DC
  Filled 2015-12-16: qty 30

## 2015-12-16 MED ORDER — POTASSIUM CHLORIDE 10 MEQ/50ML IV SOLN
10.0000 meq | Freq: Once | INTRAVENOUS | Status: AC
  Administered 2015-12-16: 10 meq via INTRAVENOUS
  Filled 2015-12-16: qty 50

## 2015-12-16 NOTE — Progress Notes (Signed)
Name: Timothy [name] MRN: [id number] DOB: Sep 27, 1946    ADMISSION DATE:  12/06/2015 CONSULTATION DATE:  12/07/15  REFERRING MD :  Dr. 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    SIGNIFICANT EVENTS  12/15  Admit from [facility] with multiple c/o's - more diarrhea, SOB, slurred speech, low blood sugars 12/19 FOB>>>  MICRO:  BAL 12/19>>> normal flora  AFB 12/19>>> smear NEG  PCP 12/19>>> NEG  CMV 12/19>>>CMV IgG positive but IgM negative Legionella 12/19>>> FOB tissue culture 12/19>>> S bovis  STUDIES:  12/15  CT Chest >> bilateral airspace & ground glass opacities, atherosclerosis in coronary arteries, cardiomegaly, small R pleural effusion, non-specific gallbladder distention, trace perihepatic ascites  12/15  ECHO >> LV with mod to severe LVH, mild reduction of systolic fxn, EF Q000111Q, No RWMA, grade 1 diastolic dysfunction, mild mitral regurg, mild aortic regurg 12/16  VQ >> LOW prob PE  ABX:  Bactrim 12/17>>>12/20 Acyclovir 12/15>>>12/21 Ganciclovir 12/21>>> Diflucan 12/15>>> Levaquin 12/20>>>   SUBJECTIVE: No issues overnight. States that breathing is better.  VITAL SIGNS: Temp:  [97.9 F (36.6 C)-98.1 F (36.7 C)] 97.9 F (36.6 C) (12/25 1238) Pulse Rate:  [69-86] 69 (12/25 1200) Resp:  [17-25] 19 (12/25 1200) BP: (99-148)/(67-95) 104/84 mmHg (12/25 1200) SpO2:  [89 %-100 %] 89 % (12/25 1200) Weight:  [214 lb 4.6 oz (97.2 kg)] 214 lb 4.6 oz (97.2 kg) (12/25 0400)  PHYSICAL EXAMINATION: General:  Chronically ill appearing male, comfortable on [place] Neuro:  AAO x 4, speech clear, MAE HEENT:  MM pink/moist, no jvd  Cardiovascular: HR regular at 87bpm,  s1/s2 regular, +2 peripheral pulses  Lungs:   Even/non-labored on , diminished bases, few bibasilar crackles.    Abdomen:  Round/soft, bsx4 active, non-tender  Musculoskeletal:  Warm and dry, 2+ BLE edema SKIN: Bruising in hands and arms   Recent Labs Lab 12/14/15 0430 12/15/15 0545 12/16/15 0105  NA 140 143 146*  K 3.9 3.5 3.2*  CL 104 102 105  CO2 22 24 25   BUN 121* 126* 111*  CREATININE 3.82* 3.70* 3.49*  GLUCOSE 213* 295* 179*    Recent Labs Lab 12/14/15 0430 12/15/15 0545 12/16/15 0105  HGB 10.2* 10.1* 10.1*  HCT 30.1* 30.5* 30.6*  WBC 4.0 3.8* 3.5*  PLT 84* 89* 94*   Dg Chest Port 1 View  12/16/2015  CLINICAL DATA:  Acute respiratory failure EXAM: PORTABLE CHEST 1 VIEW COMPARISON:  12/14/2015 FINDINGS: Right chest wall port a catheter is noted with tip in the cavoatrial junction. Moderate cardiac enlargement. Decreased lung volumes. Pulmonary edema pattern is stable to improved in the interval. IMPRESSION: 1. Stable to improved appearance of pulmonary edema pattern. Electronically Signed   By: Kerby Moors M.D.   On: 12/16/2015 09:22   DISCUSSION: [name] is a 69 year old with CLL status post allogenic stem cell transplant, graft versus host disease affecting skin, gut, CMV reactivation, immunosuppression admitted with dyspnea, bilateral infiltrates on CT scan. VQ scan is low probability for PE. S/p FOB on 12/19. All studies to date are negative. Transferred to ICU for worsening hypoxemia.   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  ASSESSMENT / PLAN:  PULMONARY Bilateral Infiltrates / Ground Glass Opacities Small Right Pleural Effusion  Acute Hypoxic Respiratory Failure - Improved hypoxia Plan: Abx per ID as below.  Wean down HFNC as toelrated Intermittent  CXR Pulm hygiene per protocol Get OOB Cont solumedrol 500 mg q12. To continue till 12/26. Then transition to pred 100mg  qd with slow taper.   CARDIOVASCULAR Hx HTN  AFib  Volume overload  Intermittent bradycardia-HR stable in the 70s P:  Monitor HR for bradycardia  Holding Lasix given the hypotension and sinus tachycardia.  RENAL AKI on CKD-Remains net positive on I/O but urine output improving Hypomagnesemia  P:   F/u chem and magnesium level Replete lytes PO bicarb  KVO HCO3 gtt  Avoid nephrotoxic medications  GASTROINTESTINAL Nausea without vomiting P:   Advance feeds as tolerated GI prophylaxis Zofran prn  HEMATOLOGIC CLL  Chronic Graft vs Host Disease  P:  Continue solumedrol, acyclovir, prograf and diflucan Plan per Dr. Jana Hakim   INFECTIOUS Bilateral pulmonary infiltrates - presumed infectious  S bovis on tissue culture. Not sure if this is pathogenic. He is already on appropriate abx P:   Abx as above per ID  CMV negative to date.  ENDOCRINE Relative adrenal insufficiency Hyperglycemia  P:   Cont steroids  SSI  Increase lantus to 30U  NEUROLOGIC Hx depression, anxiety  Chronic pain  P:   PRN morphine  Cont zoloft, PRN ativan   FAMILY  - Updates:  Pt updated at bedside.  Critical care- 45 mins.  Marshell Garfinkel MD [facility] Pager [phone] If no answer or after 3pm call: [phone] 12/16/2015, 12:46 PM

## 2015-12-16 NOTE — Progress Notes (Signed)
[facility] ROUNDING NOTE   Subjective:   Interval History: sitting comfortably in bed   Objective:  Vital signs in last 24 hours:  Temp:  [97.9 F (36.6 C)-98.1 F (36.7 C)] 97.9 F (36.6 C) (12/25 1238) Pulse Rate:  [69-86] 69 (12/25 1200) Resp:  [17-25] 19 (12/25 1200) BP: (99-148)/(67-95) 104/84 mmHg (12/25 1200) SpO2:  [89 %-100 %] 89 % (12/25 1200) Weight:  [97.2 kg (214 lb 4.6 oz)] 97.2 kg (214 lb 4.6 oz) (12/25 0400)  Weight change: 1.8 kg (3 lb 15.5 oz) Filed Weights   12/14/15 0437 12/15/15 0500 12/16/15 0400  Weight: 97.8 kg (215 lb 9.8 oz) 95.4 kg (210 lb 5.1 oz) 97.2 kg (214 lb 4.6 oz)    Intake/Output: I/O last 3 completed shifts: In: 1846.5 [P.O.:240; I.V.:1206.5; IV Piggyback:400] Out: 2902 [Urine:2900; Stool:2]   Intake/Output this shift:     CCVS- RRR RS- CTA diminshed with rhonchi ABD- BS present soft non-distended EXT-  2 + dependant  edema   Basic Metabolic Panel:  Recent Labs Lab 12/12/15 0550 12/13/15 0350 12/14/15 0430 12/15/15 0545 12/16/15 0105  NA 136 137 140 143 146*  K 5.1 4.8 3.9 3.5 3.2*  CL 107 107 104 102 105  CO2 17* 17* 22 24 25   GLUCOSE 346* 205* 213* 295* 179*  BUN 91* 111* 121* 126* 111*  CREATININE 3.78* 3.65* 3.82* 3.70* 3.49*  CALCIUM 7.9* 7.4* 7.0* 6.4* 6.5*  MG  --   --  1.6* 1.6* 1.5*  PHOS 4.7* 5.0* 5.5* 6.2* 5.4*    Liver Function Tests:  Recent Labs Lab 12/12/15 0550 12/13/15 0350 12/14/15 0430 12/15/15 0545 12/16/15 0105  AST 22 21 23  33 37  ALT 17 17 18 23 25   ALKPHOS 66 68 90 90 89  BILITOT 0.4 0.5 0.6 0.3 0.6  PROT 6.1* 5.9* 6.0* 5.4* 5.5*  ALBUMIN 2.4* 2.4* 2.5* 2.5* 2.4*   No results for input(s): LIPASE, AMYLASE in the last 168 hours. No results for input(s): AMMONIA in the last 168 hours.  CBC:  Recent Labs Lab 12/12/15 0550 12/13/15 0350 12/14/15 0430 12/15/15 0545 12/16/15 0105  WBC 3.0* 3.8* 4.0 3.8* 3.5*  HGB 9.5* 9.8* 10.2* 10.1* 10.1*  HCT 28.3* 28.3*  30.1* 30.5* 30.6*  MCV 89.3 87.6 88.0 89.2 89.2  PLT 106* 106* 84* 89* 94*    Cardiac Enzymes:  Recent Labs Lab 12/15/15 1255 12/15/15 1855 12/16/15 0105  TROPONINI 0.41* 0.52* 0.50*    BNP: Invalid input(s): POCBNP  CBG:  Recent Labs Lab 12/15/15 1215 12/15/15 1613 12/15/15 2018 12/15/15 2345 12/16/15 0759  GLUCAP 178* 179* 188* 173* 305*    Microbiology: Results for orders placed or performed during the hospital encounter of 12/06/15  C difficile quick scan w PCR reflex     Status: None   Collection Time: 12/08/15  3:02 PM  Result Value Ref Range Status   C Diff antigen NEGATIVE NEGATIVE Final   C Diff toxin NEGATIVE NEGATIVE Final   C Diff interpretation Negative for toxigenic C. difficile  Final  AFB culture with smear     Status: None (Preliminary result)   Collection Time: 12/10/15 11:33 AM  Result Value Ref Range Status   Specimen Description BRONCHIAL ALVEOLAR LAVAGE  Final   Special Requests Immunocompromised  Final   Acid Fast Smear   Final    NO ACID FAST BACILLI SEEN Performed at [facility]    Culture   Final    CULTURE WILL BE  EXAMINED FOR 6 WEEKS BEFORE ISSUING A FINAL REPORT Performed at [facility]    Report Status PENDING  Incomplete  Culture, bal-quantitative     Status: None   Collection Time: 12/10/15 11:35 AM  Result Value Ref Range Status   Specimen Description BRONCHIAL ALVEOLAR LAVAGE  Final   Special Requests Immunocompromised  Final   Gram Stain   Final    MODERATE WBC PRESENT, PREDOMINANTLY MONONUCLEAR RARE SQUAMOUS EPITHELIAL CELLS PRESENT NO ORGANISMS SEEN Performed at [facility]   Final    35,000 COLONIES/ML Performed at [facility]    Culture   Final    NORMAL OROPHARYNGEAL FLORA Performed at [facility]    Report Status 12/12/2015 FINAL  Final  Fungus Culture with Smear     Status: None (Preliminary result)   Collection Time: 12/10/15 11:38 AM   Result Value Ref Range Status   Specimen Description BRONCHIAL ALVEOLAR LAVAGE  Final   Special Requests Immunocompromised  Final   Fungal Smear   Final    NO YEAST OR FUNGAL ELEMENTS SEEN Performed at [facility]    Culture   Final    CULTURE IN PROGRESS FOR FOUR WEEKS Performed at [facility]    Report Status PENDING  Incomplete  Pneumocystis smear by DFA     Status: None   Collection Time: 12/10/15 11:39 AM  Result Value Ref Range Status   Specimen Source-PJSRC BRONCHIAL ALVEOLAR LAVAGE  Final   Pneumocystis jiroveci Ag NEGATIVE  Final    Comment: Performed at [place] [facility]  Tissue culture     Status: None   Collection Time: 12/10/15 11:40 AM  Result Value Ref Range Status   Specimen Description BIOPSY LUNG TRANSBRONCHIAL  Final   Special Requests Immunocompromised  Final   Gram Stain   Final    NO WBC SEEN NO ORGANISMS SEEN Performed at [facility]    Culture   Final    RARE STREPTOCOCCUS GROUP D;high probability for S.bovis Note: Gram Stain Report Called to,Read Back By and Verified With: [facility] TORRES 12/10/15 @ 0707P YIMSU Performed at [facility]    Report Status 12/16/2015 FINAL  Final   Organism ID, Bacteria STREPTOCOCCUS GROUP D;high probability for S.bovis  Final      Susceptibility   Streptococcus group d;high probability for s.bovis - MIC (ETEST)*    PENICILLIN <=0.125 SENSITIVE Sensitive     * RARE STREPTOCOCCUS GROUP D;high probability for S.bovis  MRSA PCR Screening     Status: None   Collection Time: 12/12/15  9:46 AM  Result Value Ref Range Status   MRSA by PCR NEGATIVE NEGATIVE Final    Comment:        The GeneXpert MRSA Assay (FDA approved for NASAL specimens only), is one component of a comprehensive MRSA colonization surveillance program. It is not intended to diagnose MRSA infection nor to guide or monitor treatment for MRSA infections.    *Note: Due to a large number of results and/or  encounters for the requested time period, some results have not been displayed. A complete set of results can be found in Results Review.    Coagulation Studies: No results for input(s): LABPROT, INR in the last 72 hours.  Urinalysis: No results for input(s): COLORURINE, LABSPEC, PHURINE, GLUCOSEU, HGBUR, BILIRUBINUR, KETONESUR, PROTEINUR, UROBILINOGEN, NITRITE, LEUKOCYTESUR in the last 72 hours.  Invalid input(s): APPERANCEUR    Imaging: Dg Chest [facility]  1 View  12/16/2015  CLINICAL DATA:  Acute respiratory failure EXAM: PORTABLE CHEST 1 VIEW COMPARISON:  12/14/2015 FINDINGS: Right chest wall port a catheter is noted with tip in the cavoatrial junction. Moderate cardiac enlargement. Decreased lung volumes. Pulmonary edema pattern is stable to improved in the interval. IMPRESSION: 1. Stable to improved appearance of pulmonary edema pattern. Electronically Signed   By: Kerby Moors M.D.   On: 12/16/2015 09:22     Medications:   . diltiazem (CARDIZEM) infusion 10 mg/hr (12/16/15 1246)  .  sodium bicarbonate  infusion 1000 mL 30 mL/hr at 12/16/15 1130   . sodium chloride   Intravenous Once  . antiseptic oral rinse  7 mL Mouth Rinse BID  . budesonide  3 mg Oral TID  . calcium gluconate  1 g Intravenous BID  . cholestyramine  4 g Oral BID  . feeding supplement (GLUCERNA SHAKE)  237 mL Oral BID BM  . fluconazole  100 mg Oral Daily  . ganciclovir (CYTOVENE) IV  1.25 mg/kg Intravenous Q24H  . insulin aspart  0-20 Units Subcutaneous TID AC & HS  . insulin glargine  30 Units Subcutaneous Daily  . labetalol  300 mg Oral BID  . levofloxacin  750 mg Oral Q48H  . loratadine  10 mg Oral Daily  . magnesium gluconate  500 mg Oral Once  . methylPREDNISolone (SOLU-MEDROL) injection  500 mg Intravenous Q12H  . pantoprazole  40 mg Oral Daily  . potassium chloride  40 mEq Oral Once  . sertraline  50 mg Oral q morning - 10a  . sodium bicarbonate  1,300 mg Oral TID  . sodium chloride  10-40 mL  Intracatheter Q12H  . tacrolimus  1 mg Oral BID   HYDROcodone-acetaminophen, hydrocortisone, LORazepam, menthol-cetylpyridinium, morphine injection, ondansetron (ZOFRAN) IV, prochlorperazine, sodium chloride, technetium TC 50M diethylenetriame-pentaacetic acid  Assessment/ Plan:   Acute kidney injury appears stable Creatinine stable slightly better this AM  Chronic Kidney Disease Baseline creatinine 2's  Respiratory failure per CCM Levoquin, fluconazole and solumedrol gancyclovir  S/P bone marrow transplant  Volume status improved continue to hold lasix  BUN elevated Steroids and diuretics probable etiology No evidence of GI bleed    LOS: 10 Timothy [name] W @TODAY @2 :05 PM

## 2015-12-16 NOTE — Progress Notes (Signed)
Patient ID: Timothy [name], male   DOB: 29-Aug-1946, 69 y.o.   MRN: [id number]         [facility]    Date of Admission:  12/06/2015           Day 11 fluconazole        Day 6 levofloxacin        Day 5 ganciclovir  Active Problems:   Shortness of breath   PNA (pneumonia)   Pulmonary infiltrate   Acute respiratory failure ([place])   . sodium chloride   Intravenous Once  . antiseptic oral rinse  7 mL Mouth Rinse BID  . budesonide  3 mg Oral TID  . calcium gluconate  1 g Intravenous BID  . cholestyramine  4 g Oral BID  . feeding supplement (GLUCERNA SHAKE)  237 mL Oral BID BM  . fluconazole  100 mg Oral Daily  . ganciclovir (CYTOVENE) IV  1.25 mg/kg Intravenous Q24H  . insulin aspart  0-20 Units Subcutaneous TID AC & HS  . insulin glargine  30 Units Subcutaneous Daily  . labetalol  300 mg Oral BID  . levofloxacin  750 mg Oral Q48H  . loratadine  10 mg Oral Daily  . methylPREDNISolone (SOLU-MEDROL) injection  500 mg Intravenous Q12H  . pantoprazole  40 mg Oral Daily  . sertraline  50 mg Oral q morning - 10a  . sodium bicarbonate  1,300 mg Oral TID  . sodium chloride  10-40 mL Intracatheter Q12H  . tacrolimus  1 mg Oral BID    SUBJECTIVE: He is feeling better today.  Review of Systems: Review of Systems  Constitutional: Positive for malaise/fatigue. Negative for fever, chills and diaphoresis.  Respiratory: Positive for cough and shortness of breath. Negative for hemoptysis and sputum production.        His cough and shortness of breath have improved.  Cardiovascular: Negative for chest pain.  Gastrointestinal: Positive for diarrhea. Negative for nausea, vomiting and abdominal pain.    Past Medical History  Diagnosis Date  . 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 Diverticular disease   . Hyperlipidemia   . Obesity   . Hypertension   . Hiatal hernia   . CMV (cytomegalovirus) antibody positive     pre-transplant, with seroconversion x2  pst-transplant  . Right bundle branch block     pre-transplant  . CKD (chronic kidney disease) stage 2, GFR 60-89 ml/min   . Pancytopenia ([place])   . Atrial fibrillation ([place])     post-transplant  . Myopathy   . 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 Diarrhea in adult patient 12/05/2012    Due to active GVHD  . Rash of face 12/05/2012    Due to GVHD  . Hypomagnesemia 01/26/2013  . Left hip pain 12/01/2013  . Steroid-induced diabetes ([place])     novalog  . Leukemia, chronic lymphoid ([place])   . CLL (chronic lymphocytic leukemia) ([place]) 12/05/2012    Dx 07/1999; started Rx 12/06  AlloBMT 3/13    Social History  Substance Use Topics  . Smoking status: Never Smoker   . Smokeless tobacco: Never Used  . Alcohol Use: No    Family History  Problem Relation Age of Onset  . Cancer Father    Allergies  Allergen Reactions  . Benadryl [Diphenhydramine Hcl]     "Restless leg syndrome"    OBJECTIVE: Filed Vitals:   12/16/15  0400 12/16/15 0500 12/16/15 0600 12/16/15 0809  BP: 132/81 147/83 136/73   Pulse: 74 74 73   Temp: 98.1 F (36.7 C)   98.1 F (36.7 C)  TempSrc: Oral     Resp: 20 23 20    Height:      Weight: 214 lb 4.6 oz (97.2 kg)     SpO2: 100% 99% 100%    Body mass index is 33.55 kg/(m^2).  Physical Exam  Constitutional: He is oriented to person, place, and time.  He is sitting up on the side of the bed. He wants to shave.  HENT:  Mouth/Throat: No oropharyngeal exudate.  Cardiovascular: Regular rhythm.   No murmur heard. Tachycardic.  Pulmonary/Chest: Effort normal and breath sounds normal.  Clear lungs anteriorly.  Abdominal: Soft. Bowel sounds are normal. There is no tenderness.  Neurological: He is alert and oriented to person, place, and time.  Skin:  Scattered ecchymoses.  Psychiatric: Mood and affect normal.    Lab Results Lab Results  Component Value Date   WBC 3.5*  12/16/2015   HGB 10.1* 12/16/2015   HCT 30.6* 12/16/2015   MCV 89.2 12/16/2015   PLT 94* 12/16/2015    Lab Results  Component Value Date   CREATININE 3.49* 12/16/2015   BUN 111* 12/16/2015   NA 146* 12/16/2015   K 3.2* 12/16/2015   CL 105 12/16/2015   CO2 25 12/16/2015    Lab Results  Component Value Date   ALT 25 12/16/2015   AST 37 12/16/2015   ALKPHOS 89 12/16/2015   BILITOT 0.6 12/16/2015     Microbiology: Recent Results (from the past 240 hour(s))  C difficile quick scan w PCR reflex     Status: None   Collection Time: 12/08/15  3:02 PM  Result Value Ref Range Status   C Diff antigen NEGATIVE NEGATIVE Final   C Diff toxin NEGATIVE NEGATIVE Final   C Diff interpretation Negative for toxigenic C. difficile  Final  AFB culture with smear     Status: None (Preliminary result)   Collection Time: 12/10/15 11:33 AM  Result Value Ref Range Status   Specimen Description BRONCHIAL ALVEOLAR LAVAGE  Final   Special Requests Immunocompromised  Final   Acid Fast Smear   Final    NO ACID FAST BACILLI SEEN Performed at [facility]    Culture   Final    CULTURE WILL BE EXAMINED FOR 6 WEEKS BEFORE ISSUING A FINAL REPORT Performed at [facility]    Report Status PENDING  Incomplete  Culture, bal-quantitative     Status: None   Collection Time: 12/10/15 11:35 AM  Result Value Ref Range Status   Specimen Description BRONCHIAL ALVEOLAR LAVAGE  Final   Special Requests Immunocompromised  Final   Gram Stain   Final    MODERATE WBC PRESENT, PREDOMINANTLY MONONUCLEAR RARE SQUAMOUS EPITHELIAL CELLS PRESENT NO ORGANISMS SEEN Performed at [place]   Final    35,000 COLONIES/ML Performed at [facility]    Culture   Final    NORMAL OROPHARYNGEAL FLORA Performed at [facility]    Report Status 12/12/2015 FINAL  Final  Fungus Culture with Smear     Status: None (Preliminary result)   Collection Time: 12/10/15 11:38  AM  Result Value Ref Range Status   Specimen Description BRONCHIAL ALVEOLAR LAVAGE  Final   Special Requests Immunocompromised  Final   Fungal Smear   Final  NO YEAST OR FUNGAL ELEMENTS SEEN Performed at [facility]    Culture   Final    CULTURE IN PROGRESS FOR FOUR WEEKS Performed at [facility]    Report Status PENDING  Incomplete  Pneumocystis smear by DFA     Status: None   Collection Time: 12/10/15 11:39 AM  Result Value Ref Range Status   Specimen Source-PJSRC BRONCHIAL ALVEOLAR LAVAGE  Final   Pneumocystis jiroveci Ag NEGATIVE  Final    Comment: Performed at [place] [facility]  Tissue culture     Status: None   Collection Time: 12/10/15 11:40 AM  Result Value Ref Range Status   Specimen Description BIOPSY LUNG TRANSBRONCHIAL  Final   Special Requests Immunocompromised  Final   Gram Stain   Final    NO WBC SEEN NO ORGANISMS SEEN Performed at [facility]    Culture   Final    RARE STREPTOCOCCUS GROUP D;high probability for S.bovis Note: Gram Stain Report Called to,Read Back By and Verified With: [facility] TORRES 12/10/15 @ 0707P YIMSU Performed at [facility]    Report Status 12/16/2015 FINAL  Final   Organism ID, Bacteria STREPTOCOCCUS GROUP D;high probability for S.bovis  Final      Susceptibility   Streptococcus group d;high probability for s.bovis - MIC (ETEST)*    PENICILLIN <=0.125 SENSITIVE Sensitive     * RARE STREPTOCOCCUS GROUP D;high probability for S.bovis  MRSA PCR Screening     Status: None   Collection Time: 12/12/15  9:46 AM  Result Value Ref Range Status   MRSA by PCR NEGATIVE NEGATIVE Final    Comment:        The GeneXpert MRSA Assay (FDA approved for NASAL specimens only), is one component of a comprehensive MRSA colonization surveillance program. It is not intended to diagnose MRSA infection nor to guide or monitor treatment for MRSA infections.      ASSESSMENT: He is improving on broad  empiric antibiotic therapy.  PLAN: 1. Continue current antimicrobial therapy for now  Michel Bickers, MD [facility] [place] [phone] pager   [phone] cell 12/16/2015, 10:10 AM

## 2015-12-17 ENCOUNTER — Inpatient Hospital Stay (HOSPITAL_COMMUNITY): Payer: BC Managed Care – PPO

## 2015-12-17 DIAGNOSIS — N179 Acute kidney failure, unspecified: Secondary | ICD-10-CM

## 2015-12-17 DIAGNOSIS — E876 Hypokalemia: Secondary | ICD-10-CM

## 2015-12-17 LAB — COMPREHENSIVE METABOLIC PANEL
ALK PHOS: 87 U/L (ref 38–126)
ALT: 32 U/L (ref 17–63)
ANION GAP: 17 — AB (ref 5–15)
AST: 42 U/L — ABNORMAL HIGH (ref 15–41)
Albumin: 2.6 g/dL — ABNORMAL LOW (ref 3.5–5.0)
BUN: 136 mg/dL — ABNORMAL HIGH (ref 6–20)
CALCIUM: 6.3 mg/dL — AB (ref 8.9–10.3)
CHLORIDE: 102 mmol/L (ref 101–111)
CO2: 25 mmol/L (ref 22–32)
Creatinine, Ser: 3.47 mg/dL — ABNORMAL HIGH (ref 0.61–1.24)
GFR calc non Af Amer: 17 mL/min — ABNORMAL LOW (ref >60)
GFR, EST AFRICAN AMERICAN: 19 mL/min — AB (ref >60)
Glucose, Bld: 267 mg/dL — ABNORMAL HIGH (ref 65–99)
Potassium: 3 mmol/L — ABNORMAL LOW (ref 3.5–5.1)
SODIUM: 144 mmol/L (ref 135–145)
Total Bilirubin: 1.3 mg/dL — ABNORMAL HIGH (ref 0.3–1.2)
Total Protein: 5.5 g/dL — ABNORMAL LOW (ref 6.5–8.1)

## 2015-12-17 LAB — GLUCOSE, CAPILLARY
GLUCOSE-CAPILLARY: 245 mg/dL — AB (ref 65–99)
GLUCOSE-CAPILLARY: 209 mg/dL — AB (ref 65–99)
GLUCOSE-CAPILLARY: 192 mg/dL — AB (ref 65–99)
Glucose-Capillary: 222 mg/dL — ABNORMAL HIGH (ref 65–99)
Glucose-Capillary: 253 mg/dL — ABNORMAL HIGH (ref 65–99)

## 2015-12-17 LAB — CBC
HCT: 32.8 % — ABNORMAL LOW (ref 39.0–52.0)
HEMOGLOBIN: 10.6 g/dL — AB (ref 13.0–17.0)
MCH: 29 pg (ref 26.0–34.0)
MCHC: 32.3 g/dL (ref 30.0–36.0)
MCV: 89.9 fL (ref 78.0–100.0)
Platelets: 104 10*3/uL — ABNORMAL LOW (ref 150–400)
RBC: 3.65 MIL/uL — AB (ref 4.22–5.81)
RDW: 15.7 % — ABNORMAL HIGH (ref 11.5–15.5)
WBC: 3.4 10*3/uL — ABNORMAL LOW (ref 4.0–10.5)

## 2015-12-17 LAB — PHOSPHORUS: PHOSPHORUS: 5.3 mg/dL — AB (ref 2.5–4.6)

## 2015-12-17 LAB — VARICELLA-ZOSTER BY PCR: VARICELLA-ZOSTER, PCR: NEGATIVE

## 2015-12-17 LAB — MAGNESIUM: Magnesium: 1.6 mg/dL — ABNORMAL LOW (ref 1.7–2.4)

## 2015-12-17 MED ORDER — TACROLIMUS 0.5 MG PO CAPS: 0.5000 mg | ORAL_CAPSULE | Freq: Two times a day (BID) | ORAL | Status: DC

## 2015-12-17 MED ORDER — POTASSIUM CHLORIDE CRYS ER 20 MEQ PO TBCR
30.0000 meq | EXTENDED_RELEASE_TABLET | ORAL | Status: AC
  Administered 2015-12-17 (×2): 30 meq via ORAL
  Filled 2015-12-17 (×2): qty 1

## 2015-12-17 MED ORDER — INSULIN ASPART 100 UNIT/ML ~~LOC~~ SOLN
0.0000 [IU] | Freq: Every day | SUBCUTANEOUS | Status: DC
  Administered 2015-12-18 – 2015-12-25 (×4): 2 [IU] via SUBCUTANEOUS

## 2015-12-17 MED ORDER — SODIUM BICARBONATE 650 MG PO TABS
1300.0000 mg | ORAL_TABLET | Freq: Two times a day (BID) | ORAL | Status: DC
  Administered 2015-12-17 – 2015-12-28 (×22): 1300 mg via ORAL
  Filled 2015-12-17 (×22): qty 2

## 2015-12-17 MED ORDER — PREDNISONE 50 MG PO TABS
50.0000 mg | ORAL_TABLET | Freq: Two times a day (BID) | ORAL | Status: DC
  Administered 2015-12-17 – 2015-12-26 (×19): 50 mg via ORAL
  Filled 2015-12-17 (×4): qty 2
  Filled 2015-12-17 (×8): qty 1
  Filled 2015-12-17: qty 2
  Filled 2015-12-17 (×3): qty 1
  Filled 2015-12-17: qty 2
  Filled 2015-12-17 (×3): qty 1

## 2015-12-17 MED ORDER — MAGNESIUM SULFATE 2 GM/50ML IV SOLN
2.0000 g | Freq: Once | INTRAVENOUS | Status: AC
  Administered 2015-12-17: 2 g via INTRAVENOUS
  Filled 2015-12-17: qty 50

## 2015-12-17 MED ORDER — FUROSEMIDE 40 MG PO TABS
80.0000 mg | ORAL_TABLET | Freq: Two times a day (BID) | ORAL | Status: DC
  Administered 2015-12-17 – 2015-12-18 (×3): 80 mg via ORAL
  Filled 2015-12-17 (×3): qty 2

## 2015-12-17 MED ORDER — INSULIN GLARGINE 100 UNIT/ML ~~LOC~~ SOLN
40.0000 [IU] | Freq: Every day | SUBCUTANEOUS | Status: DC
  Administered 2015-12-17 – 2015-12-19 (×3): 40 [IU] via SUBCUTANEOUS
  Filled 2015-12-17 (×3): qty 0.4

## 2015-12-17 MED ORDER — INSULIN ASPART 100 UNIT/ML ~~LOC~~ SOLN
0.0000 [IU] | Freq: Three times a day (TID) | SUBCUTANEOUS | Status: DC
  Administered 2015-12-17: 4 [IU] via SUBCUTANEOUS
  Administered 2015-12-17: 7 [IU] via SUBCUTANEOUS
  Administered 2015-12-18: 4 [IU] via SUBCUTANEOUS
  Administered 2015-12-18: 7 [IU] via SUBCUTANEOUS
  Administered 2015-12-19: 4 [IU] via SUBCUTANEOUS
  Administered 2015-12-19 – 2015-12-20 (×4): 7 [IU] via SUBCUTANEOUS
  Administered 2015-12-20 – 2015-12-21 (×2): 4 [IU] via SUBCUTANEOUS
  Administered 2015-12-21: 7 [IU] via SUBCUTANEOUS
  Administered 2015-12-21 – 2015-12-22 (×3): 4 [IU] via SUBCUTANEOUS
  Administered 2015-12-22 – 2015-12-23 (×2): 7 [IU] via SUBCUTANEOUS
  Administered 2015-12-23 – 2015-12-25 (×8): 4 [IU] via SUBCUTANEOUS
  Administered 2015-12-26: 7 [IU] via SUBCUTANEOUS
  Administered 2015-12-26 – 2015-12-27 (×3): 4 [IU] via SUBCUTANEOUS
  Administered 2015-12-27 – 2015-12-28 (×3): 3 [IU] via SUBCUTANEOUS

## 2015-12-17 NOTE — Progress Notes (Signed)
Pharmacy Antibiotic Follow-up Note  Timothy [name] is a 69 y.o. year-old male admitted on 12/06/2015.  The patient has completed 7 days of Levaquin 750 mg PO q48h and day 6/14 of ganciclovir (current dosage 125 mg IV q24h) for pulmonary infection of uncertain etiology.  Ganciclovir dose/schedule has been adjusted for renal insufficiency. The patient is on immunosuppressive therapy following allogeneic BMT for CLL in 2013; has GVHD with skin and GI manifestations.  Has had progressive decline in kidney function over the past several months; SCr was typically ranging in the "mid 2's" and has increased during the present admission.   Assessment: Pulmonary infection of uncertain etiology in setting of immunosuppressive therapy for GVHD AoCKD.  ID service is following.  Plan: Continue present ganciclovir dosage (125 mg IV q24H), plan total 14 days Ganciclovir, may switch to po Valganciclovir when able Follow renal function, culture results, viral serologies, clinical course.    Temp (24hrs), Avg:97.9 F (36.6 C), Min:97.6 F (36.4 C), Max:98.6 F (37 C)   Recent Labs Lab 12/13/15 0350 12/14/15 0430 12/15/15 0545 12/16/15 0105 12/17/15 0350  WBC 3.8* 4.0 3.8* 3.5* 3.4*     Recent Labs Lab 12/13/15 0350 12/14/15 0430 12/15/15 0545 12/16/15 0105 12/17/15 0350  CREATININE 3.65* 3.82* 3.70* 3.49* 3.47*   Estimated Creatinine Clearance: 22.4 mL/min (by C-G formula based on Cr of 3.47).    Allergies  Allergen Reactions  . Benadryl [Diphenhydramine Hcl]     "Restless leg syndrome"   Antimicrobials this admission: 12/17 >> Septra >> 12/20 12/20 >> levaquin >> 12/26 12/21 >> ganciclovir >> PTA >> fluconazole PTA >> acyclovir >> 12/21  Levels/dose changes this admission: Tacrolimus adjusted further by Renal MD for elevated levels, resume 12/29 at 0.5mg  bid.  Diltiazem and Fluconazole can increase levels.  Microbiology and serology results: 2/23 HSV DNA: not collected 12/23  HSV IgG 1&2: high- indicating Ab's detected 12/23 HSV IgM: IP 12/23 VZV DNA:IP 12/23 VZV IgG: positive 12/23 VZV IgM: IP 12/21 MRSA PCR screen: Neg 12/21 CMV DNA: "positive" at < 200 12/19 BAL culture: Normal flora 12/19 BAL tissue culture: Strep Group D (prob bovis) 12/19 BAL AFB: none seen, culture IP 12/19 CMV culture: IP 12/19 CMV DNA: "positive" at < 200 12/19 BAL fungal culture: no yeast seen, culture IP 12/19 BAL legionella culture: IP 12/19 BAL PCP Ag: neg 12/18 CMV DNA: Neg 12/17 Aspergillus: Neg 12/17 CMV IgM:  Negative 12/17 CMV IgG:  1.3 (increased) 12/17 CMV DNA:  Negative 12/17 CDiff negative 12/16 Influenza: Negative  Thank you for allowing Pharmacy to be a part of this patient's care  Minda Ditto PharmD Pager [phone] 12/17/2015, 12:58 PM

## 2015-12-17 NOTE — Progress Notes (Addendum)
[facility] Progress Note   Subjective: CXR today looks best yest, RLL process cleared and perihilar edema cleared. UOP yest not recorded. Lasix stopped on 12/24. No chg wt today 97.5kg  Filed Vitals:   12/17/15 0700 12/17/15 0800 12/17/15 0847 12/17/15 0900  BP: 146/115 128/76  119/71  Pulse: 72 70  68  Temp:   97.7 F (36.5 C)   TempSrc:      Resp: 20 22  23   Height:      Weight:      SpO2: 98% 99%  99%    Inpatient medications: . sodium chloride   Intravenous Once  . antiseptic oral rinse  7 mL Mouth Rinse BID  . calcium gluconate  1 g Intravenous BID  . cholestyramine  4 g Oral BID  . feeding supplement (GLUCERNA SHAKE)  237 mL Oral BID BM  . fluconazole  100 mg Oral Daily  . ganciclovir (CYTOVENE) IV  1.25 mg/kg Intravenous Q24H  . insulin aspart  0-20 Units Subcutaneous TID WC  . insulin aspart  0-5 Units Subcutaneous QHS  . insulin glargine  40 Units Subcutaneous Daily  . labetalol  300 mg Oral BID  . levofloxacin  750 mg Oral Q48H  . loratadine  10 mg Oral Daily  . pantoprazole  40 mg Oral Daily  . potassium chloride  40 mEq Oral Once  . predniSONE  50 mg Oral BID WC  . sertraline  50 mg Oral q morning - 10a  . sodium bicarbonate  1,300 mg Oral TID  . sodium chloride  10-40 mL Intracatheter Q12H  . tacrolimus  1 mg Oral BID   . diltiazem (CARDIZEM) infusion 5 mg/hr (12/16/15 1500)  .  sodium bicarbonate  infusion 1000 mL 30 mL/hr at 12/16/15 2200   HYDROcodone-acetaminophen, hydrocortisone, LORazepam, menthol-cetylpyridinium, morphine injection, ondansetron (ZOFRAN) IV, prochlorperazine, sodium chloride, technetium TC 40M diethylenetriame-pentaacetic acid  Exam: Alert, tremulous No jvd Chest left base rales, R clear RRR  Abd obese, distended poss ascites 1+ hip and pedal edema bilat Neuro Ox3       Assessment: 1 AKI tac toxicity +/- other. Creat stable mid 3's 2 CKD stage IV creat 2.5 Nov 3 Pulm - much better, cxr improved today looks  normal 4 Tac tox from dilt/ CKD, dilt dc'd; still an issue levels ~20 (goal 6-12) 5 GVHD 6 BMT 7 HTN labetalol  8 Hypomag from tac toxicity 9 Tremor d/t tac  Plan - hold Tac 72 hrs, restart Thurs 0.5 bid; have d/w pharm, f/u level Mon next wk. No indication for RRT yet. Resuming po lasix to keep vol stable, suspect he responded to the high dose lasix given 12/19-24. DC IVF at 30/hr.    Kelly Splinter MD [facility] pager [phone]    cell [phone] 12/17/2015, 10:18 AM    Recent Labs Lab 12/15/15 0545 12/16/15 0105 12/17/15 0350  NA 143 146* 144  K 3.5 3.2* 3.0*  CL 102 105 102  CO2 24 25 25   GLUCOSE 295* 179* 267*  BUN 126* 111* 136*  CREATININE 3.70* 3.49* 3.47*  CALCIUM 6.4* 6.5* 6.3*  PHOS 6.2* 5.4* 5.3*    Recent Labs Lab 12/15/15 0545 12/16/15 0105 12/17/15 0350  AST 33 37 42*  ALT 23 25 32  ALKPHOS 90 89 87  BILITOT 0.3 0.6 1.3*  PROT 5.4* 5.5* 5.5*  ALBUMIN 2.5* 2.4* 2.6*    Recent Labs Lab 12/15/15 0545 12/16/15 0105 12/17/15 0350  WBC 3.8* 3.5* 3.4*  HGB 10.1* 10.1* 10.6*  HCT 30.5* 30.6* 32.8*  MCV 89.2 89.2 89.9  PLT 89* 94* 104*

## 2015-12-17 NOTE — Progress Notes (Signed)
Patient ID: Timothy [name], male   DOB: 1946-04-09, 69 y.o.   MRN: [id number]         [facility] for Infectious Disease    Date of Admission:  12/06/2015                   Day 7 levofloxacin        Day 6 ganciclovir  Active Problems:   Shortness of breath   PNA (pneumonia)   Pulmonary infiltrate   Acute respiratory failure ([place])   . sodium chloride   Intravenous Once  . antiseptic oral rinse  7 mL Mouth Rinse BID  . calcium gluconate  1 g Intravenous BID  . cholestyramine  4 g Oral BID  . feeding supplement (GLUCERNA SHAKE)  237 mL Oral BID BM  . fluconazole  100 mg Oral Daily  . furosemide  80 mg Oral BID  . ganciclovir (CYTOVENE) IV  1.25 mg/kg Intravenous Q24H  . insulin aspart  0-20 Units Subcutaneous TID WC  . insulin aspart  0-5 Units Subcutaneous QHS  . insulin glargine  40 Units Subcutaneous Daily  . labetalol  300 mg Oral BID  . levofloxacin  750 mg Oral Q48H  . loratadine  10 mg Oral Daily  . pantoprazole  40 mg Oral Daily  . potassium chloride  40 mEq Oral Once  . predniSONE  50 mg Oral BID WC  . sertraline  50 mg Oral q morning - 10a  . sodium bicarbonate  1,300 mg Oral BID  . sodium chloride  10-40 mL Intracatheter Q12H  . [START ON 12/20/2015] tacrolimus  0.5 mg Oral BID    SUBJECTIVE: He is feeling better today. He notes that his cough and shortness of breath have improved. He feels like getting out of bed today.  Review of Systems: Review of Systems  Constitutional: Positive for malaise/fatigue. Negative for fever, chills and diaphoresis.  Respiratory: Positive for cough and shortness of breath. Negative for hemoptysis and sputum production.        His cough and shortness of breath have improved.  Cardiovascular: Negative for chest pain.  Gastrointestinal: Positive for diarrhea. Negative for nausea, vomiting and abdominal pain.    Past Medical History  Diagnosis Date  . 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 Diverticular disease   .  Hyperlipidemia   . Obesity   . Hypertension   . Hiatal hernia   . CMV (cytomegalovirus) antibody positive     pre-transplant, with seroconversion x2 pst-transplant  . Right bundle branch block     pre-transplant  . CKD (chronic kidney disease) stage 2, GFR 60-89 ml/min   . Pancytopenia ([place])   . Atrial fibrillation ([place])     post-transplant  . Myopathy   . 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 Chronic GVHD complicating bone marrow transplantation ([place]) 12/05/2012  . Diarrhea in adult patient 12/05/2012    Due to active GVHD  . Rash of face 12/05/2012    Due to GVHD  . Hypomagnesemia 01/26/2013  . Left hip pain 12/01/2013  . Steroid-induced diabetes ([place])     novalog  . Leukemia, chronic lymphoid ([place])   . CLL (chronic lymphocytic leukemia) ([place]) 12/05/2012    Dx 07/1999; started Rx 12/06  AlloBMT 3/13    Social History  Substance Use Topics  . Smoking status: Never Smoker   . Smokeless tobacco: Never Used  . Alcohol Use: No    Family History  Problem Relation  Age of Onset  . Cancer Father    Allergies  Allergen Reactions  . Benadryl [Diphenhydramine Hcl]     "Restless leg syndrome"    OBJECTIVE: Filed Vitals:   12/17/15 0700 12/17/15 0800 12/17/15 0847 12/17/15 0900  BP: 146/115 128/76  119/71  Pulse: 72 70  68  Temp:   97.7 F (36.5 C)   TempSrc:      Resp: 20 22  23   Height:      Weight:      SpO2: 98% 99%  99%   Body mass index is 33.66 kg/(m^2).  Physical Exam  Constitutional: He is oriented to person, place, and time.  He is clean shaven. He is alert and in no distress.  HENT:  Mouth/Throat: No oropharyngeal exudate.  Cardiovascular: Normal rate and regular rhythm.   No murmur heard. Pulmonary/Chest: Effort normal and breath sounds normal.  Abdominal: Soft. Bowel sounds are normal. There is no tenderness.  Neurological: He is alert and oriented to person, place, and time.  Skin:  Scattered ecchymoses.  Psychiatric: Mood and affect  normal.    Lab Results Lab Results  Component Value Date   WBC 3.4* 12/17/2015   HGB 10.6* 12/17/2015   HCT 32.8* 12/17/2015   MCV 89.9 12/17/2015   PLT 104* 12/17/2015    Lab Results  Component Value Date   CREATININE 3.47* 12/17/2015   BUN 136* 12/17/2015   NA 144 12/17/2015   K 3.0* 12/17/2015   CL 102 12/17/2015   CO2 25 12/17/2015    Lab Results  Component Value Date   ALT 32 12/17/2015   AST 42* 12/17/2015   ALKPHOS 87 12/17/2015   BILITOT 1.3* 12/17/2015     Microbiology: Recent Results (from the past 240 hour(s))  C difficile quick scan w PCR reflex     Status: None   Collection Time: 12/08/15  3:02 PM  Result Value Ref Range Status   C Diff antigen NEGATIVE NEGATIVE Final   C Diff toxin NEGATIVE NEGATIVE Final   C Diff interpretation Negative for toxigenic C. difficile  Final  AFB culture with smear     Status: None (Preliminary result)   Collection Time: 12/10/15 11:33 AM  Result Value Ref Range Status   Specimen Description BRONCHIAL ALVEOLAR LAVAGE  Final   Special Requests Immunocompromised  Final   Acid Fast Smear   Final    NO ACID FAST BACILLI SEEN Performed at [facility]    Culture   Final    CULTURE WILL BE EXAMINED FOR 6 WEEKS BEFORE ISSUING A FINAL REPORT Performed at [facility]    Report Status PENDING  Incomplete  Culture, bal-quantitative     Status: None   Collection Time: 12/10/15 11:35 AM  Result Value Ref Range Status   Specimen Description BRONCHIAL ALVEOLAR LAVAGE  Final   Special Requests Immunocompromised  Final   Gram Stain   Final    MODERATE WBC PRESENT, PREDOMINANTLY MONONUCLEAR RARE SQUAMOUS EPITHELIAL CELLS PRESENT NO ORGANISMS SEEN Performed at [place]   Final    35,000 COLONIES/ML Performed at [facility]    Culture   Final    NORMAL OROPHARYNGEAL FLORA Performed at [facility]    Report Status 12/12/2015 FINAL  Final  Fungus Culture with  Smear     Status: None (Preliminary result)   Collection Time: 12/10/15 11:38 AM  Result Value Ref Range Status   Specimen Description BRONCHIAL  ALVEOLAR LAVAGE  Final   Special Requests Immunocompromised  Final   Fungal Smear   Final    NO YEAST OR FUNGAL ELEMENTS SEEN Performed at [facility]    Culture   Final    CULTURE IN PROGRESS FOR FOUR WEEKS Performed at [facility]    Report Status PENDING  Incomplete  Pneumocystis smear by DFA     Status: None   Collection Time: 12/10/15 11:39 AM  Result Value Ref Range Status   Specimen Source-PJSRC BRONCHIAL ALVEOLAR LAVAGE  Final   Pneumocystis jiroveci Ag NEGATIVE  Final    Comment: Performed at [place] [facility]  Tissue culture     Status: None   Collection Time: 12/10/15 11:40 AM  Result Value Ref Range Status   Specimen Description BIOPSY LUNG TRANSBRONCHIAL  Final   Special Requests Immunocompromised  Final   Gram Stain   Final    NO WBC SEEN NO ORGANISMS SEEN Performed at [facility]    Culture   Final    RARE STREPTOCOCCUS GROUP D;high probability for S.bovis Note: Gram Stain Report Called to,Read Back By and Verified With: [facility] TORRES 12/10/15 @ 0707P YIMSU Performed at [facility]    Report Status 12/16/2015 FINAL  Final   Organism ID, Bacteria STREPTOCOCCUS GROUP D;high probability for S.bovis  Final      Susceptibility   Streptococcus group d;high probability for s.bovis - MIC (ETEST)*    PENICILLIN <=0.125 SENSITIVE Sensitive     * RARE STREPTOCOCCUS GROUP D;high probability for S.bovis  MRSA PCR Screening     Status: None   Collection Time: 12/12/15  9:46 AM  Result Value Ref Range Status   MRSA by PCR NEGATIVE NEGATIVE Final    Comment:        The GeneXpert MRSA Assay (FDA approved for NASAL specimens only), is one component of a comprehensive MRSA colonization surveillance program. It is not intended to diagnose MRSA infection nor to guide or monitor  treatment for MRSA infections.      ASSESSMENT: He is improving on broad empiric antibiotic therapy. I will stop his levofloxacin after today's dose and continue ganciclovir for now. I will repeat his CMV PCR. We can consider converting him over to oral valganciclovir soon to complete 14 days of total therapy.  PLAN: 1. Continue ganciclovir 2. Discontinue levofloxacin 3. Repeat CMV PCR  Michel Bickers, MD [facility] for Infectious [facility] [phone] pager   [phone] cell 12/17/2015, 10:48 AM

## 2015-12-17 NOTE — Progress Notes (Signed)
eLink Physician-Brief Progress Note Patient Name: Timothy [name] DOB: 1946/01/14 MRN: [id number]   Date of Service  12/17/2015  HPI/Events of Note  Hypokalemia, hypomag and hypocalcemia in the setting of AoCRF  eICU Interventions  Plan: Check ionized calcium Replace Mag and Potassium     Intervention Category Intermediate Interventions: Electrolyte abnormality - evaluation and management  DETERDING,ELIZABETH 12/17/2015, 4:48 AM

## 2015-12-17 NOTE — Progress Notes (Signed)
CRITICAL VALUE ALERT  Critical value received:  Calcium 6.3  Date of notification:  12/17/15  Time of notification:  [id number]  Critical value read back:Yes.    Nurse who received alert:  Darrin Nipper, RN  MD notified (1st page):  Elink  Time of first page:  [phone]  *

## 2015-12-17 NOTE — Progress Notes (Signed)
Name: Timothy [name] MRN: [id number] DOB: Jun 09, 1946    ADMISSION DATE:  12/06/2015 CONSULTATION DATE:  12/07/15  REFERRING MD :  Dr. 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    SIGNIFICANT EVENTS  12/15 - Admit from [facility] with multiple c/o's - more diarrhea, SOB, slurred speech, low blood sugars 12/19 - FOB  MICROBIOLOGY:  BAL 12/19>>> normal flora  BAL AFB 12/19>>> smear NEG  BAL Fungus 12/19>>> smear NEG PCP 12/19>>> NEG  CMV 12/19>>>CMV IgG positive but IgM negative Legionella 12/19>>> FOB tissue culture 12/19: S bovis Stool C diff 12/17:  Negative  STUDIES:  CT Chest 12/15: bilateral airspace & ground glass opacities, atherosclerosis in coronary arteries, cardiomegaly, small R pleural effusion, non-specific gallbladder distention, trace perihepatic ascites  TTE 12/15: LV with mod to severe LVH, mild reduction of systolic fxn, EF [id number], No RWMA, grade 1 diastolic dysfunction, mild mitral regurg, mild aortic regurg VQ 12/16: LOW prob PE Port CXR 12/26:  Patchy bilateral opacities. Some improvement compared with prior CXR. No pleural effusion appreciated with low lung volumes.  ANTIBIOTICS: Ganciclovir 12/21>>> Diflucan 12/15>>> Levaquin 12/20>>> Bactrim 12/17 - 12/20 Acyclovir 12/15 - 12/21  SUBJECTIVE: No issues overnight. States that breathing is better. Denies any coughing. Did have some nausea this morning requiring Zofran.  REVIEW OF SYSTEMS:  Patient denies any fever, chills, or sweats. No chest pain or pressure.  VITAL SIGNS: Temp:  [97.6 F (36.4 C)-98.6 F (37 C)] 97.6 F (36.4 C) (12/26 0400) Pulse Rate:  [68-77] 72 (12/26 0700) Resp:  [16-25] 20 (12/26 0700) BP: (104-158)/(75-115) 146/115  mmHg (12/26 0700) SpO2:  [89 %-100 %] 98 % (12/26 0700) Weight:  [214 lb 15.2 oz (97.5 kg)] 214 lb 15.2 oz (97.5 kg) (12/26 0600)  PHYSICAL EXAMINATION: General:  No distress. Awake. Alert. Neuro:  Oriented x4. Moving all 4 extremities equally. No meningismus. HEENT:  No oral ulcers. Moist mucus membranes. No scleral icterus. Cardiovascular: Regular rhythm. Trace edema. Sinus on telemetry. Lungs:  Crackles in bases. Normal work of breathing on HFNC. Speaking in complete sentences.    Abdomen:  Soft. Nontender. Nondistended. Integument:  Bruising of various ages. No rash on exposed skin. Warm & dry.   Recent Labs Lab 12/15/15 0545 12/16/15 0105 12/17/15 0350  NA 143 146* 144  K 3.5 3.2* 3.0*  CL 102 105 102  CO2 24 25 25   BUN 126* 111* 136*  CREATININE 3.70* 3.49* 3.47*  GLUCOSE 295* 179* 267*    Recent Labs Lab 12/15/15 0545 12/16/15 0105 12/17/15 0350  HGB 10.1* 10.1* 10.6*  HCT 30.5* 30.6* 32.8*  WBC 3.8* 3.5* 3.4*  PLT 89* 94* 104*   Dg Chest Port 1 View  12/16/2015  CLINICAL DATA:  Acute respiratory failure EXAM: PORTABLE CHEST 1 VIEW COMPARISON:  12/14/2015 FINDINGS: Right chest wall port a catheter is noted with tip in the cavoatrial junction. Moderate cardiac enlargement. Decreased lung volumes. Pulmonary edema pattern is stable to improved in the interval. IMPRESSION: 1. Stable to improved appearance of pulmonary edema pattern. Electronically Signed   By: Kerby Moors M.D.   On: 12/16/2015 09:22   ASSESSMENT / PLAN:  PULMONARY A: Acute Hypoxic Respiratory Failure - Improving with steroid therapy. Bilateral Infiltrates / Ground Glass Opacities - Doubtful infectious. Probable GVHD  of lungs. Slowly clearing on repeat CXR. Small Right Pleural Effusion   P: Abx per ID as below.  Wean down HFNC as toelrated Intermittent CXR Pulm hygiene per protocol Get OOB Wean Solu-Medrol to Prednisone 100mg  daily  CARDIOVASCULAR A: Atrial Fibrillation Intermittent  Bradycardia  P:  Monitor telemetry for bradycardia  Holding Lasix given the hypotension  Labetalol 300mg  bid Off Diltiazem gtt  RENAL A: Acute on Chronic Renal Failure - Creatinine Stable. UOP stable. Hypokalemia - Replacing PO. Hypomagnesemia - Replacing IV. Hypocalcemia - Replacing IV. Hyperphosphatemia - Mild.   P:   Nephrology following Calcium Gluconate 1gm IV bid Ionized Calcium pending KCl 60 mEq PO Magnesium Sulfate 1gm IV Bicarb gtt (161mEq) 30cc/hr Monitoring UOP Trending renal function daily with BUN/Creatinine Repeating electrolytes daily Avoid nephrotoxic medications  GASTROINTESTINAL A: Nausea without vomiting  P:   Advance feeds as tolerated Protonix PO daily Zofran IV prn  HEMATOLOGIC A: CLL  Chronic Graft vs Host Disease   P:  Plan per Dr. Jana Hakim / Dr. Lindi Adie Solu-Medrol 1gm daily Prograf 1mg  bid Prograf level this AM SCDs  INFECTIOUS A: Bilateral pulmonary infiltrates - presumed infectious  S bovis on tissue culture. Not sure if this is pathogenic. He is already on appropriate abx  P:   Infectious Diseases Following Ganciclovir Day #6 Levaquin Day #7 Diflucan Day #12  ENDOCRINE A: Relative Adrenal Insufficiency Hyperglycemia   P:   Tapering Steroids SSI per Resistant Algorithm Increase Lantus to 40u [place] daily  NEUROLOGIC A: H/O Depression/Anxiety Chronic pain   P:   Morphine IV q2hr prn  Norco 5/325mg  q4hr prn Companzine q6hr prn Zoloft 50mg  daily  FAMILY  - Updates:  Pt updated at bedside today by Dr. Ashok Cordia.   TODAY'S SUMMARY:  69 y.o. WM s/p stem cell transplant on chronic immunosuppression. Likely has GVHD of the lungs given improvement on chest imaging & oxygen requirement. Continuing close monitoring in ICU given high oxygen requirement & potential for clinical decompensation.  I have spent a total of 41 minutes of critical care time caring for the patient and reviewing his electronic medical  record.  Sonia Baller Ashok Cordia, M.D. [facility] Pulmonary & Critical Care Pager:  [phone] After 3pm or if no response, call [phone]  12/17/2015, 8:16 AM

## 2015-12-17 NOTE — Progress Notes (Signed)
12/17/15 @ 0130: In to check IV access for Nurse draw or Lab draw and noticed PAC tubing at the site had blood in it.  Light turned on to assess more.  I noticed IV tubing had been snapped in half below the line clamp, blood slowly oozing from end of snapped line.  PAC immediately deaccessed by withdrawing needle.  Site cleaned, no bleeding noted.  Pt cleaned up.  Noticed aproximately 50cc of blood under pts right side due to blood pooling there from oozing line.  IV team immediately notified to reaccess PAC.  IV team at bedside now.  Isaiah Blakes

## 2015-12-18 DIAGNOSIS — N179 Acute kidney failure, unspecified: Secondary | ICD-10-CM | POA: Diagnosis present

## 2015-12-18 DIAGNOSIS — N189 Chronic kidney disease, unspecified: Secondary | ICD-10-CM

## 2015-12-18 LAB — CBC
HCT: 34.3 % — ABNORMAL LOW (ref 39.0–52.0)
Hemoglobin: 11.1 g/dL — ABNORMAL LOW (ref 13.0–17.0)
MCH: 29.3 pg (ref 26.0–34.0)
MCHC: 32.4 g/dL (ref 30.0–36.0)
MCV: 90.5 fL (ref 78.0–100.0)
Platelets: 115 10*3/uL — ABNORMAL LOW (ref 150–400)
RBC: 3.79 MIL/uL — AB (ref 4.22–5.81)
RDW: 15.6 % — AB (ref 11.5–15.5)
WBC: 4.4 10*3/uL (ref 4.0–10.5)

## 2015-12-18 LAB — PHOSPHORUS: Phosphorus: 5.1 mg/dL — ABNORMAL HIGH (ref 2.5–4.6)

## 2015-12-18 LAB — GLUCOSE, CAPILLARY
GLUCOSE-CAPILLARY: 176 mg/dL — AB (ref 65–99)
GLUCOSE-CAPILLARY: 179 mg/dL — AB (ref 65–99)
GLUCOSE-CAPILLARY: 188 mg/dL — AB (ref 65–99)
GLUCOSE-CAPILLARY: 206 mg/dL — AB (ref 65–99)
GLUCOSE-CAPILLARY: 220 mg/dL — AB (ref 65–99)
Glucose-Capillary: 155 mg/dL — ABNORMAL HIGH (ref 65–99)

## 2015-12-18 LAB — COMPREHENSIVE METABOLIC PANEL
ALT: 35 U/L (ref 17–63)
AST: 48 U/L — ABNORMAL HIGH (ref 15–41)
Albumin: 2.5 g/dL — ABNORMAL LOW (ref 3.5–5.0)
Alkaline Phosphatase: 91 U/L (ref 38–126)
Anion gap: 15 (ref 5–15)
BUN: 146 mg/dL — ABNORMAL HIGH (ref 6–20)
CHLORIDE: 104 mmol/L (ref 101–111)
CO2: 24 mmol/L (ref 22–32)
CREATININE: 3.53 mg/dL — AB (ref 0.61–1.24)
Calcium: 6.4 mg/dL — CL (ref 8.9–10.3)
GFR, EST AFRICAN AMERICAN: 19 mL/min — AB (ref >60)
GFR, EST NON AFRICAN AMERICAN: 16 mL/min — AB (ref >60)
Glucose, Bld: 195 mg/dL — ABNORMAL HIGH (ref 65–99)
Potassium: 3.6 mmol/L (ref 3.5–5.1)
Sodium: 143 mmol/L (ref 135–145)
Total Bilirubin: 1.3 mg/dL — ABNORMAL HIGH (ref 0.3–1.2)
Total Protein: 5.1 g/dL — ABNORMAL LOW (ref 6.5–8.1)

## 2015-12-18 LAB — VARICELLA ZOSTER ANTIBODY, IGM: Varicella-Zoster Ab, IgM: <0.91 index (ref 0.00–0.90)

## 2015-12-18 LAB — CALCIUM, IONIZED: CALCIUM, IONIZED, SERUM: 3.4 mg/dL — AB (ref 4.5–5.6)

## 2015-12-18 LAB — HSV(HERPES SIMPLEX VRS) I + II AB-IGM

## 2015-12-18 LAB — MAGNESIUM: MAGNESIUM: 2.1 mg/dL (ref 1.7–2.4)

## 2015-12-18 MED ORDER — VALGANCICLOVIR HCL 450 MG PO TABS
450.0000 mg | ORAL_TABLET | ORAL | Status: DC
  Administered 2015-12-18 – 2015-12-22 (×3): 450 mg via ORAL
  Filled 2015-12-18 (×3): qty 1

## 2015-12-18 MED ORDER — FUROSEMIDE 40 MG PO TABS
160.0000 mg | ORAL_TABLET | Freq: Two times a day (BID) | ORAL | Status: DC
  Administered 2015-12-18: 160 mg via ORAL
  Filled 2015-12-18: qty 4

## 2015-12-18 MED ORDER — NEPRO/CARBSTEADY PO LIQD
237.0000 mL | ORAL | Status: DC
  Administered 2015-12-18 – 2015-12-25 (×5): 237 mL via ORAL
  Filled 2015-12-18 (×9): qty 237

## 2015-12-18 MED ORDER — DILTIAZEM HCL ER COATED BEADS 240 MG PO CP24
240.0000 mg | ORAL_CAPSULE | Freq: Every day | ORAL | Status: DC
  Administered 2015-12-18 – 2015-12-28 (×11): 240 mg via ORAL
  Filled 2015-12-18 (×3): qty 1
  Filled 2015-12-18: qty 2
  Filled 2015-12-18 (×4): qty 1
  Filled 2015-12-18: qty 2
  Filled 2015-12-18: qty 1
  Filled 2015-12-18: qty 2

## 2015-12-18 MED ORDER — CALCITRIOL 0.25 MCG PO CAPS
0.2500 ug | ORAL_CAPSULE | Freq: Every day | ORAL | Status: DC
  Administered 2015-12-18 – 2015-12-28 (×11): 0.25 ug via ORAL
  Filled 2015-12-18 (×11): qty 1

## 2015-12-18 NOTE — Progress Notes (Signed)
Patient ID: Timothy [name], male   DOB: November 10, 1946, 69 y.o.   MRN: [id number]         [facility] for Infectious Disease    Date of Admission:  12/06/2015                           Day 7 ganciclovir  Active Problems:   Shortness of breath   PNA (pneumonia)   Pulmonary infiltrate   Acute respiratory failure (HCC)   Acute on chronic renal failure ([place])   . sodium chloride   Intravenous Once  . antiseptic oral rinse  7 mL Mouth Rinse BID  . calcitRIOL  0.25 mcg Oral Daily  . calcium gluconate  1 g Intravenous BID  . cholestyramine  4 g Oral BID  . feeding supplement (GLUCERNA SHAKE)  237 mL Oral BID BM  . feeding supplement (NEPRO CARB STEADY)  237 mL Oral Q24H  . fluconazole  100 mg Oral Daily  . furosemide  160 mg Oral BID  . ganciclovir (CYTOVENE) IV  1.25 mg/kg Intravenous Q24H  . insulin aspart  0-20 Units Subcutaneous TID WC  . insulin aspart  0-5 Units Subcutaneous QHS  . insulin glargine  40 Units Subcutaneous Daily  . labetalol  300 mg Oral BID  . loratadine  10 mg Oral Daily  . pantoprazole  40 mg Oral Daily  . potassium chloride  40 mEq Oral Once  . predniSONE  50 mg Oral BID WC  . sertraline  50 mg Oral q morning - 10a  . sodium bicarbonate  1,300 mg Oral BID  . sodium chloride  10-40 mL Intracatheter Q12H  . [START ON 12/20/2015] tacrolimus  0.5 mg Oral BID    SUBJECTIVE: He continues to feel better.  Review of Systems: Review of Systems  Constitutional: Positive for malaise/fatigue. Negative for fever, chills and diaphoresis.  Respiratory: Positive for cough and shortness of breath. Negative for hemoptysis and sputum production.        His cough and shortness of breath have improved.  Cardiovascular: Negative for chest pain.  Gastrointestinal: Positive for diarrhea. Negative for nausea, vomiting and abdominal pain.    Past Medical History  Diagnosis Date  . 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 Diverticular disease   . Hyperlipidemia   .  Obesity   . Hypertension   . Hiatal hernia   . CMV (cytomegalovirus) antibody positive     pre-transplant, with seroconversion x2 pst-transplant  . Right bundle branch block     pre-transplant  . CKD (chronic kidney disease) stage 2, GFR 60-89 ml/min   . Pancytopenia ([place])   . Atrial fibrillation ([place])     post-transplant  . Myopathy   . 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 Chronic GVHD complicating bone marrow transplantation ([place]) 12/05/2012  . Diarrhea in adult patient 12/05/2012    Due to active GVHD  . Rash of face 12/05/2012    Due to GVHD  . Hypomagnesemia 01/26/2013  . Left hip pain 12/01/2013  . Steroid-induced diabetes ([place])     novalog  . Leukemia, chronic lymphoid ([place])   . CLL (chronic lymphocytic leukemia) ([place]) 12/05/2012    Dx 07/1999; started Rx 12/06  AlloBMT 3/13    Social History  Substance Use Topics  . Smoking status: Never Smoker   . Smokeless tobacco: Never Used  . Alcohol Use: No    Family History  Problem Relation Age  of Onset  . Cancer Father    Allergies  Allergen Reactions  . Benadryl [Diphenhydramine Hcl]     "Restless leg syndrome"    OBJECTIVE: Filed Vitals:   12/18/15 0500 12/18/15 0600 12/18/15 0800 12/18/15 1100  BP: 134/84 121/88 137/90 137/86  Pulse: 75 73 72 72  Temp:   97.6 F (36.4 C)   TempSrc:   Oral   Resp: 22 19 19 16   Height:      Weight: 220 lb 3.8 oz (99.9 kg)     SpO2: 92% 97% 95% 95%   Body mass index is 34.49 kg/(m^2).  Physical Exam  Constitutional: He is oriented to person, place, and time.  He appears comfortable.  HENT:  Mouth/Throat: No oropharyngeal exudate.  Cardiovascular: Normal rate and regular rhythm.   No murmur heard. Pulmonary/Chest: Effort normal and breath sounds normal.  Abdominal: Soft. Bowel sounds are normal. There is no tenderness.  Neurological: He is alert and oriented to person, place, and time.  Psychiatric: Mood and affect normal.    Lab Results Lab Results    Component Value Date   WBC 4.4 12/18/2015   HGB 11.1* 12/18/2015   HCT 34.3* 12/18/2015   MCV 90.5 12/18/2015   PLT 115* 12/18/2015    Lab Results  Component Value Date   CREATININE 3.53* 12/18/2015   BUN 146* 12/18/2015   NA 143 12/18/2015   K 3.6 12/18/2015   CL 104 12/18/2015   CO2 24 12/18/2015    Lab Results  Component Value Date   ALT 35 12/18/2015   AST 48* 12/18/2015   ALKPHOS 91 12/18/2015   BILITOT 1.3* 12/18/2015     Microbiology: Recent Results (from the past 240 hour(s))  C difficile quick scan w PCR reflex     Status: None   Collection Time: 12/08/15  3:02 PM  Result Value Ref Range Status   C Diff antigen NEGATIVE NEGATIVE Final   C Diff toxin NEGATIVE NEGATIVE Final   C Diff interpretation Negative for toxigenic C. difficile  Final  AFB culture with smear     Status: None (Preliminary result)   Collection Time: 12/10/15 11:33 AM  Result Value Ref Range Status   Specimen Description BRONCHIAL ALVEOLAR LAVAGE  Final   Special Requests Immunocompromised  Final   Acid Fast Smear   Final    NO ACID FAST BACILLI SEEN Performed at [facility]    Culture   Final    CULTURE WILL BE EXAMINED FOR 6 WEEKS BEFORE ISSUING A FINAL REPORT Performed at [facility]    Report Status PENDING  Incomplete  Culture, bal-quantitative     Status: None   Collection Time: 12/10/15 11:35 AM  Result Value Ref Range Status   Specimen Description BRONCHIAL ALVEOLAR LAVAGE  Final   Special Requests Immunocompromised  Final   Gram Stain   Final    MODERATE WBC PRESENT, PREDOMINANTLY MONONUCLEAR RARE SQUAMOUS EPITHELIAL CELLS PRESENT NO ORGANISMS SEEN Performed at [place]   Final    35,000 COLONIES/ML Performed at [facility]    Culture   Final    NORMAL OROPHARYNGEAL FLORA Performed at [facility]    Report Status 12/12/2015 FINAL  Final  Fungus Culture with Smear     Status: None (Preliminary result)    Collection Time: 12/10/15 11:38 AM  Result Value Ref Range Status   Specimen Description BRONCHIAL ALVEOLAR LAVAGE  Final   Special Requests  Immunocompromised  Final   Fungal Smear   Final    NO YEAST OR FUNGAL ELEMENTS SEEN Performed at [facility]    Culture   Final    CULTURE IN PROGRESS FOR FOUR WEEKS Performed at [facility]    Report Status PENDING  Incomplete  Pneumocystis smear by DFA     Status: None   Collection Time: 12/10/15 11:39 AM  Result Value Ref Range Status   Specimen Source-PJSRC BRONCHIAL ALVEOLAR LAVAGE  Final   Pneumocystis jiroveci Ag NEGATIVE  Final    Comment: Performed at [place] [facility]  Tissue culture     Status: None   Collection Time: 12/10/15 11:40 AM  Result Value Ref Range Status   Specimen Description BIOPSY LUNG TRANSBRONCHIAL  Final   Special Requests Immunocompromised  Final   Gram Stain   Final    NO WBC SEEN NO ORGANISMS SEEN Performed at [facility]    Culture   Final    RARE STREPTOCOCCUS GROUP D;high probability for S.bovis Note: Gram Stain Report Called to,Read Back By and Verified With: [facility] TORRES 12/10/15 @ 0707P YIMSU Performed at [facility]    Report Status 12/16/2015 FINAL  Final   Organism ID, Bacteria STREPTOCOCCUS GROUP D;high probability for S.bovis  Final      Susceptibility   Streptococcus group d;high probability for s.bovis - MIC (ETEST)*    PENICILLIN <=0.125 SENSITIVE Sensitive     * RARE STREPTOCOCCUS GROUP D;high probability for S.bovis  MRSA PCR Screening     Status: None   Collection Time: 12/12/15  9:46 AM  Result Value Ref Range Status   MRSA by PCR NEGATIVE NEGATIVE Final    Comment:        The GeneXpert MRSA Assay (FDA approved for NASAL specimens only), is one component of a comprehensive MRSA colonization surveillance program. It is not intended to diagnose MRSA infection nor to guide or monitor treatment for MRSA infections.       ASSESSMENT: He continues to improve on empiric ganciclovir and steroids. I will change ganciclovir to oral valganciclovir and plan on 7 more days of therapy.  PLAN: 1. Change ganciclovir to oral valganciclovir and treat for 7 more days 2. I will sign off now but please call if we can be of further assistance while he is here  Michel Bickers, [place] for [place] [phone] pager   [phone] cell 12/18/2015, 11:55 AM

## 2015-12-18 NOTE — Progress Notes (Signed)
Date: December 18, 2015 Chart reviewed for concurrent status and case management needs. Will continue to follow patient for changes and needs: Rhonda Davis, RN, BSN, CCM   [phone] 

## 2015-12-18 NOTE — Progress Notes (Addendum)
Nutrition Follow-up  DOCUMENTATION CODES:   Not applicable  INTERVENTION:  - Continue Glucerna Shake but will decrease to BID - Will order Nepro Shake once/day to determine tolerance/acceptance, this supplement provides 425 kcal and 19 grams of protein - Encourage PO intakes with meals and supplements - RD will continue to monitor for needs  NUTRITION DIAGNOSIS:   Inadequate oral intake related to acute illness as evidenced by per patient/family report, percent weight loss. -likely ongoing  GOAL:   Patient will meet greater than or equal to 90% of their needs -likely unmet  MONITOR:   PO intake, Supplement acceptance, Weight trends, Labs, Skin, I & O's  ASSESSMENT:   Timothy [name] return to the clinic today for a scheduled visit. He receives intravenous fluids and intravenous magnesium every 2 weeks. He has multiple chronic complaints, which have been generally stable Today however before the visit his wife [name] called to tell us that he had some difficulty breathing and a bit of slurred speech. He has had more diarrhea recently, not in terms of frequency but in terms of volume. He has lost quite a bit of fluid there. He has had no cough or fevers despite the shortness of breath. He continues to problems with swollen feet. His blood sugars have been running low, even though he has been using less insulin per her report.  12/27 Pt sleeping at time of RD visit with no visitors present in the room at that time. No intakes documented since previous RD visit. Visualized 100% completion of juice and milk and 50% completion of Glucerna Shake. Breakfast tray on bedside table but is untouched at this time.   RD will continue to monitor for needs and intakes. It appears that pt has been drinking fluids well but may not be meeting needs with inadequate intake of solid foods. Medications reviewed. Labs reviewed; CBGs: 155-305 mg/dL, BUN/creatinine elevated and trending up, Ca: 6.4 mg/dL, Phos: 5.1 mg/dL,  GFR: 16.  Per Nephrology note this AM, pt may require dialysis soon.    12/22 - Per chart review, pt consumed 30% of lunch 12/20 with no other intakes documented since initial assessment. - Pt on BiPAP and untouched breakfast tray at bedside.  - Pt reports that he was told he is permitted to take BiPAP off for meals but that he is not hungry or interested in consuming breakfast this AM.  - Will order Glucerna Shake as it may be easier for pt to consume liquids rather than solid foods given respiratory distress.  - Should pt be unable to consume PO on BiPAP, TF recommendations: Glucerna 1.2 @ 50 mL/hr with 30 mL Prostat TID which will provide 1740 kcal, 117 grams of protein, and 966 mL free water.  12/16 - Spoke with pt at bedside. - Reports that SOB has increased over a number of weeks along with blood glucose running low, even though he has been using less insulin.  - Also reports 4# wt loss in 1 week (2% wt loss) which is significant for time frame - Reports low blood sugars have hurt his appetite some. - Per chart, PO intake of 75% thus far. - Patient does have diarrhea regularly - related to bone marrow transplant. 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   Diet Order:  Diet Carb Modified Fluid consistency:: Thin; Room service appropriate?: Yes; Fluid restriction:: 1200 mL Fluid  Skin:  Reviewed, no issues  Last BM:  12/27  Height:  Ht Readings from Last 1 Encounters:  12/12/15 5\' 7"  (1.702 m)    Weight:   Wt Readings from Last 1 Encounters:  12/18/15 220 lb 3.8 oz (99.9 kg)    Ideal Body Weight:  64.54 kg  BMI:  Body mass index is 34.49 kg/(m^2).  Estimated Nutritional Needs:   Kcal:  1600-1800  Protein:  100-120 grams  Fluid:  >/= 1.9L  EDUCATION NEEDS:   No education needs identified at this time      Jarome Matin, RD, LDN Inpatient Clinical Dietitian Pager # [phone] After hours/weekend pager  # [phone]

## 2015-12-18 NOTE — Progress Notes (Signed)
Inpatient Diabetes Program Recommendations  AACE/ADA: New Consensus Statement on Inpatient Glycemic Control (2015)  Target Ranges:  Prepandial:   less than 140 mg/dL      Peak postprandial:   less than 180 mg/dL (1-2 hours)      Critically ill patients:  140 - 180 mg/dL    Results for RYOTT, CERVENY (MRN [id number]) as of 12/18/2015 14:02  Ref. Range 12/17/2015 08:08 12/17/2015 12:36 12/17/2015 15:50 12/17/2015 19:40  Glucose-Capillary Latest Ref Range: 65-99 mg/dL 253 (H) 209 (H) 192 (H) 155 (H)    Results for ZANE, SANJURJO (MRN [id number]) as of 12/18/2015 14:02  Ref. Range 12/18/2015 05:07 12/18/2015 10:52  Glucose-Capillary Latest Ref Range: 65-99 mg/dL 188 (H) 220 (H)    Home DM Meds: Tresiba basal insulin- 50 units daily       Novolog 10-20 units tid per SSI  Current Insulin Orders: Lantus 40 units daily      Novolog Resistant SSI (0-20 units) TID AC + HS     MD- Please consider increasing Lantus to 50 units daily (per Home Med Rec, patient takes [place] basal insulin 50 units daily at home)     --Will follow patient during hospitalization--  Wyn Quaker RN, MSN, CDE Diabetes Coordinator Inpatient Glycemic Control Team Team Pager: [phone] (8a-5p)

## 2015-12-18 NOTE — Progress Notes (Signed)
Name: Timothy [name] MRN: [id number] DOB: August 14, 1946    ADMISSION DATE:  12/06/2015 CONSULTATION DATE:  12/07/15  REFERRING MD :  Dr. 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    SIGNIFICANT EVENTS  12/15 - Admit from [place] with multiple c/o's - more diarrhea, SOB, slurred speech, low blood sugars 12/19 - FOB  MICROBIOLOGY:  BAL 12/19>>> normal flora  BAL AFB 12/19>>> smear NEG  BAL Fungus 12/19>>> smear NEG PCP 12/19>>> NEG  CMV 12/19>>>CMV IgG positive but IgM negative Legionella 12/19>>> FOB tissue culture 12/19: S bovis Stool C diff 12/17:  Negative  STUDIES:  CT Chest 12/15: bilateral airspace & ground glass opacities, atherosclerosis in coronary arteries, cardiomegaly, small R pleural effusion, non-specific gallbladder distention, trace perihepatic ascites  TTE 12/15: LV with mod to severe LVH, mild reduction of systolic fxn, EF Q000111Q, No RWMA, grade 1 diastolic dysfunction, mild mitral regurg, mild aortic regurg VQ 12/16: LOW prob PE Port CXR 12/26:  Patchy bilateral opacities. Some improvement compared with prior CXR. No pleural effusion appreciated with low lung volumes.  ANTIBIOTICS: Ganciclovir 12/21>>> Diflucan 12/15>>> Levaquin 12/20>>> Bactrim 12/17 - 12/20 Acyclovir 12/15 - 12/21  SUBJECTIVE:  Biggest complaint is diarrhea.   REVIEW OF SYSTEMS:  Patient denies any fever, chills, or sweats. No chest pain or pressure.  VITAL SIGNS: Temp:  [97.6 F (36.4 C)-98.9 F (37.2 C)] 97.6 F (36.4 C) (12/27 0800) Pulse Rate:  [66-80] 72 (12/27 0800) Resp:  [14-22] 19 (12/27 0800) BP: (111-155)/(44-95) 137/90 mmHg (12/27 0800) SpO2:  [91 %-100 %] 95 % (12/27 0800) Weight:  [99.9 kg (220 lb 3.8  oz)] 99.9 kg (220 lb 3.8 oz) (12/27 0500) 3 liters  PHYSICAL EXAMINATION: General:  No distress. Awake. Alert. Neuro:  Oriented x4. Moving all 4 extremities equally. No meningismus. HEENT:  No oral ulcers. Moist mucus membranes. No scleral icterus. Cardiovascular: Regular rhythm. Trace edema. Sinus on telemetry. Lungs:  Faint Crackles in bases. Normal work of breathing on [place]. Speaking in complete sentences.    Abdomen:  Soft. Nontender. Nondistended. Integument:  Bruising of various ages. No rash on exposed skin. Warm & dry.   Recent Labs Lab 12/16/15 0105 12/17/15 0350 12/18/15 0453  NA 146* 144 143  K 3.2* 3.0* 3.6  CL 105 102 104  CO2 25 25 24   BUN 111* 136* 146*  CREATININE 3.49* 3.47* 3.53*  GLUCOSE 179* 267* 195*    Recent Labs Lab 12/16/15 0105 12/17/15 0350 12/18/15 0453  HGB 10.1* 10.6* 11.1*  HCT 30.6* 32.8* 34.3*  WBC 3.5* 3.4* 4.4  PLT 94* 104* 115*   Dg Chest Port 1 View  12/17/2015  CLINICAL DATA:  Acute respiratory failure EXAM: PORTABLE CHEST 1 VIEW COMPARISON:  12/16/2015 and 12/ 23/16 FINDINGS: Cardiomediastinal silhouette is stable. There is poor inspiration with mild basilar atelectasis. No segmental infiltrate. Right IJ Port-A-Cath is unchanged in position. Residual streaky mild edema or pneumonitis in lingula. Minimal central vascular congestion without global pulmonary edema IMPRESSION: There is poor inspiration with mild basilar atelectasis. No segmental infiltrate. Right IJ Port-A-Cath is unchanged in position. Residual streaky mild edema or pneumonitis in lingula. Electronically Signed   By: Lahoma Crocker M.D.   On: 12/17/2015 09:47   ASSESSMENT / PLAN:  PULMONARY A: Acute  Hypoxic Respiratory Failure - Improving with steroid therapy. Bilateral Infiltrates / Ground Glass Opacities - Doubtful infectious. 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 Slowly clearing on repeat CXR. Small Right Pleural Effusion   P: Abx per ID as below.  Wean FIO2 Intermittent CXR Pulm  hygiene per protocol Get OOB Weaned Solu-Medrol to Prednisone 100mg  daily  CARDIOVASCULAR A: Atrial Fibrillation Intermittent Bradycardia  P:  Monitor telemetry for bradycardia  Holding Lasix given the hypotension  Labetalol 300mg  bid Off Diltiazem gtt  RENAL A: Acute on Chronic Renal Failure - Creatinine Stable. UOP stable. Hypocalcemia - Replacing IV. Hyperphosphatemia - Mild.   P:   Nephrology following Calcium Gluconate 1gm IV bid Ionized Calcium pending Oral bicarb Monitoring UOP Trending renal function daily with BUN/Creatinine Repeating electrolytes daily Avoid nephrotoxic medications May need HD  GASTROINTESTINAL A: Nausea without vomiting Diarrhea -->probable drug related  P:   Advance feeds as tolerated Protonix PO daily Zofran IV prn  HEMATOLOGIC A: CLL  Chronic Graft vs Host Disease   P:  Plan per Dr. Jana Hakim / Dr. Lindi Adie Solu-Medrol 1gm daily Prograf 1mg  bid Prograf level this AM SCDs  INFECTIOUS A: Bilateral pulmonary infiltrates - presumed infectious  S bovis on tissue culture. Not sure if this is pathogenic. He is already on appropriate abx  P:   Infectious Diseases Following Ganciclovir Day #7 Levaquin completed 7 days  Diflucan Day #13  ENDOCRINE A: Relative Adrenal Insufficiency Hyperglycemia   P:   Tapering Steroids SSI per Resistant Algorithm Increase Lantus to 40u [place] daily  NEUROLOGIC A: H/O Depression/Anxiety Chronic pain   P:   Morphine IV q2hr prn  Norco 5/325mg  q4hr prn Companzine q6hr prn Zoloft 50mg  daily  FAMILY  - Updates:  Pt updated at bedside today by Dr. Ashok Cordia.   TODAY'S SUMMARY:  69 y.o. WM s/p stem cell transplant on chronic immunosuppression. Likely has GVHD of the lungs given improvement on chest imaging & oxygen requirement, but we did treat w/ 7d abx given s bovis culture in lung. We have been continuing close monitoring in ICU. Clinically he has improved. Biggest issue at this point is  if his renal fxn will improve.   Erick Colace ACNP-BC [place] Pager # [phone] OR # [phone] if no answer   12/18/2015, 9:29 AM

## 2015-12-18 NOTE — Progress Notes (Signed)
[facility] Progress Note   Subjective: no SOB. BUN up 146, creat stable 3.53.  No UOP recorded, pt incontinent and condom cath doesn't work.  No n/v or confusion, no jerking.  Wt up 97 > 99kg.   Filed Vitals:   12/18/15 0400 12/18/15 0500 12/18/15 0600 12/18/15 0800  BP: 150/95 134/84 121/88 137/90  Pulse: 75 75 73 72  Temp: 98 F (36.7 C)   97.6 F (36.4 C)  TempSrc: Oral   Oral  Resp: 22 22 19 19   Height:      Weight:  99.9 kg (220 lb 3.8 oz)    SpO2: 99% 92% 97% 95%    Inpatient medications: . sodium chloride   Intravenous Once  . antiseptic oral rinse  7 mL Mouth Rinse BID  . calcium gluconate  1 g Intravenous BID  . cholestyramine  4 g Oral BID  . feeding supplement (GLUCERNA SHAKE)  237 mL Oral BID BM  . fluconazole  100 mg Oral Daily  . furosemide  80 mg Oral BID  . ganciclovir (CYTOVENE) IV  1.25 mg/kg Intravenous Q24H  . insulin aspart  0-20 Units Subcutaneous TID WC  . insulin aspart  0-5 Units Subcutaneous QHS  . insulin glargine  40 Units Subcutaneous Daily  . labetalol  300 mg Oral BID  . loratadine  10 mg Oral Daily  . pantoprazole  40 mg Oral Daily  . potassium chloride  40 mEq Oral Once  . predniSONE  50 mg Oral BID WC  . sertraline  50 mg Oral q morning - 10a  . sodium bicarbonate  1,300 mg Oral BID  . sodium chloride  10-40 mL Intracatheter Q12H  . [START ON 12/20/2015] tacrolimus  0.5 mg Oral BID   . diltiazem (CARDIZEM) infusion 5 mg/hr (12/17/15 2000)   HYDROcodone-acetaminophen, hydrocortisone, LORazepam, menthol-cetylpyridinium, morphine injection, ondansetron (ZOFRAN) IV, prochlorperazine, sodium chloride, technetium TC 49M diethylenetriame-pentaacetic acid  Exam: Alert, tremulous, no distress No jvd Chest left base rales, R clear RRR  Abd obese, distended poss ascites 1+ hip and pedal edema bilat Neuro Ox3       Assessment: 1 AKI tac toxicity +/- other. Creat stable mid 3's 2 CKD stage IV creat 2.5 Nov 3 Resp/ pulm  infiltrates - resolved by last cxr after diuresis/ BS antibiotics 4 Tac tox from dilt/ CKD, dilt dc'd; still an issue w levels ~20 (goal 6-12) 5 GVHD 6 BMT 7 HTN labetalol  8 Hypomag from tac toxic 9 Hypocalcemia add vit D 10 Afib on IV dilt - with tac toxicity would prefer other agent  Plan - cont hold tac, restart on Thursday at 50% dose.  May need dialysis soon, have d/w patient as BUN increasing. Cont po lasix, increase 160 bid. Add vit D (rocaltrol).  DC IV dilt, use something else for rate control. Have d/w primary MD.    Kelly Splinter MD [facility] pager [phone]    cell [phone] 12/18/2015, 8:54 AM    Recent Labs Lab 12/16/15 0105 12/17/15 0350 12/18/15 0453  NA 146* 144 143  K 3.2* 3.0* 3.6  CL 105 102 104  CO2 25 25 24   GLUCOSE 179* 267* 195*  BUN 111* 136* 146*  CREATININE 3.49* 3.47* 3.53*  CALCIUM 6.5* 6.3* 6.4*  PHOS 5.4* 5.3* 5.1*    Recent Labs Lab 12/16/15 0105 12/17/15 0350 12/18/15 0453  AST 37 42* 48*  ALT 25 32 35  ALKPHOS 89 87 91  BILITOT 0.6 1.3* 1.3*  PROT 5.5* 5.5* 5.1*  ALBUMIN 2.4* 2.6* 2.5*    Recent Labs Lab 12/16/15 0105 12/17/15 0350 12/18/15 0453  WBC 3.5* 3.4* 4.4  HGB 10.1* 10.6* 11.1*  HCT 30.6* 32.8* 34.3*  MCV 89.2 89.9 90.5  PLT 94* 104* 115*

## 2015-12-19 DIAGNOSIS — E1121 Type 2 diabetes mellitus with diabetic nephropathy: Secondary | ICD-10-CM | POA: Diagnosis present

## 2015-12-19 DIAGNOSIS — D89813 Graft-versus-host disease, unspecified: Secondary | ICD-10-CM

## 2015-12-19 DIAGNOSIS — IMO0002 Reserved for concepts with insufficient information to code with codable children: Secondary | ICD-10-CM | POA: Diagnosis present

## 2015-12-19 DIAGNOSIS — Z794 Long term (current) use of insulin: Secondary | ICD-10-CM

## 2015-12-19 DIAGNOSIS — E1165 Type 2 diabetes mellitus with hyperglycemia: Secondary | ICD-10-CM

## 2015-12-19 DIAGNOSIS — N184 Chronic kidney disease, stage 4 (severe): Secondary | ICD-10-CM

## 2015-12-19 DIAGNOSIS — I10 Essential (primary) hypertension: Secondary | ICD-10-CM

## 2015-12-19 DIAGNOSIS — N179 Acute kidney failure, unspecified: Secondary | ICD-10-CM | POA: Diagnosis present

## 2015-12-19 DIAGNOSIS — R197 Diarrhea, unspecified: Secondary | ICD-10-CM

## 2015-12-19 DIAGNOSIS — R739 Hyperglycemia, unspecified: Secondary | ICD-10-CM

## 2015-12-19 LAB — CBC
HEMATOCRIT: 35.9 % — AB (ref 39.0–52.0)
HEMOGLOBIN: 11.4 g/dL — AB (ref 13.0–17.0)
MCH: 28.9 pg (ref 26.0–34.0)
MCHC: 31.8 g/dL (ref 30.0–36.0)
MCV: 90.9 fL (ref 78.0–100.0)
Platelets: 117 10*3/uL — ABNORMAL LOW (ref 150–400)
RBC: 3.95 MIL/uL — AB (ref 4.22–5.81)
RDW: 15.6 % — ABNORMAL HIGH (ref 11.5–15.5)
WBC: 4.6 10*3/uL (ref 4.0–10.5)

## 2015-12-19 LAB — BASIC METABOLIC PANEL
ANION GAP: 15 (ref 5–15)
BUN: 136 mg/dL — AB (ref 6–20)
CHLORIDE: 106 mmol/L (ref 101–111)
CO2: 25 mmol/L (ref 22–32)
Calcium: 6.6 mg/dL — ABNORMAL LOW (ref 8.9–10.3)
Creatinine, Ser: 3.22 mg/dL — ABNORMAL HIGH (ref 0.61–1.24)
GFR, EST AFRICAN AMERICAN: 21 mL/min — AB (ref >60)
GFR, EST NON AFRICAN AMERICAN: 18 mL/min — AB (ref >60)
Glucose, Bld: 241 mg/dL — ABNORMAL HIGH (ref 65–99)
POTASSIUM: 3.4 mmol/L — AB (ref 3.5–5.1)
SODIUM: 146 mmol/L — AB (ref 135–145)

## 2015-12-19 LAB — GLUCOSE, CAPILLARY
GLUCOSE-CAPILLARY: 220 mg/dL — AB (ref 65–99)
GLUCOSE-CAPILLARY: 144 mg/dL — AB (ref 65–99)
Glucose-Capillary: 216 mg/dL — ABNORMAL HIGH (ref 65–99)
Glucose-Capillary: 157 mg/dL — ABNORMAL HIGH (ref 65–99)

## 2015-12-19 LAB — MAGNESIUM: MAGNESIUM: 1.9 mg/dL (ref 1.7–2.4)

## 2015-12-19 LAB — PHOSPHORUS: Phosphorus: 4.7 mg/dL — ABNORMAL HIGH (ref 2.5–4.6)

## 2015-12-19 LAB — TACROLIMUS LEVEL: Tacrolimus (FK506) - LabCorp: 24.4 ng/mL — ABNORMAL HIGH (ref 2.0–20.0)

## 2015-12-19 MED ORDER — INSULIN GLARGINE 100 UNIT/ML ~~LOC~~ SOLN
50.0000 [IU] | Freq: Every day | SUBCUTANEOUS | Status: DC
  Administered 2015-12-20 – 2015-12-22 (×3): 50 [IU] via SUBCUTANEOUS
  Filled 2015-12-19 (×4): qty 0.5

## 2015-12-19 NOTE — Progress Notes (Signed)
Name: Timothy [name] MRN: [id number] DOB: 11/11/1946    ADMISSION DATE:  12/06/2015 CONSULTATION DATE:  12/07/15  REFERRING MD :  Dr. 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    SIGNIFICANT EVENTS  12/15 - Admit from [place] with multiple c/o's - more diarrhea, SOB, slurred speech, low blood sugars 12/19 - FOB  MICROBIOLOGY:  BAL 12/19:  normal flora  BAL AFB 12/19>>> smear NEG  BAL Fungus 12/19>>> smear NEG Legionella 12/19>>> PCP 12/19: NEG  CMV 12/19:CMV IgG positive but IgM negative FOB tissue culture 12/19: S bovis Stool C diff 12/17:  Negative  STUDIES:  CT Chest 12/15: bilateral airspace & ground glass opacities, atherosclerosis in coronary arteries, cardiomegaly, small R pleural effusion, non-specific gallbladder distention, trace perihepatic ascites  TTE 12/15: LV with mod to severe LVH, mild reduction of systolic fxn, EF Q000111Q, No RWMA, grade 1 diastolic dysfunction, mild mitral regurg, mild aortic regurg VQ 12/16: LOW prob PE Port CXR 12/26:  Patchy bilateral opacities. Some improvement compared with prior CXR. No pleural effusion appreciated with low lung volumes.  ANTIBIOTICS: Valcyte 12/27>>> Diflucan 12/15>>> Ganciclovir 12/21 - 12/27 Levaquin 12/20 - 12/26 Bactrim 12/17 - 12/20 Acyclovir 12/15 - 12/21  SUBJECTIVE: Patient reports he continues to have diarrhea that is slightly worse than his baseline. Denies any abdominal pain. Nausea is somewhat better today. Denies any dyspnea or significant cough today. Remains on room air.   REVIEW OF SYSTEMS:  No fever or chills. No subjective sweats. Denies any chest pressure or tightness.  VITAL SIGNS: Temp:  [97.5 F (36.4 C)-98.4 F (36.9 C)] 97.5  F (36.4 C) (12/28 0800) Pulse Rate:  [70-80] 74 (12/28 0800) Resp:  [13-19] 16 (12/28 0800) BP: (115-159)/(74-96) 129/90 mmHg (12/28 0800) SpO2:  [92 %-95 %] 92 % (12/28 0800) Weight:  [222 lb 10.6 oz (101 kg)] 222 lb 10.6 oz (101 kg) (12/28 0500)  PHYSICAL EXAMINATION: General:  Sleeping until awoken. No distress. Appears comfortable laying in bed. Neuro:  Grossly nonfocal. No meningismus. Moving all 4 extremities. HEENT:  Moist mucous membranes. No scleral injection or icterus. No oral ulcers observed. Cardiovascular: Regular rhythm. Minimal edema. Normal S1 & S2. Lungs:  Mild improvement in basilar crackles. Normal work of breathing on room air. Speaking in complete sentences.    Abdomen:  Soft. Nontender. Nondistended. Integument:  Bruising of various ages. No rash on exposed skin. Warm & dry.   Recent Labs Lab 12/17/15 0350 12/18/15 0453 12/19/15 0400  NA 144 143 146*  K 3.0* 3.6 3.4*  CL 102 104 106  CO2 25 24 25   BUN 136* 146* 136*  CREATININE 3.47* 3.53* 3.22*  GLUCOSE 267* 195* 241*    Recent Labs Lab 12/17/15 0350 12/18/15 0453 12/19/15 0400  HGB 10.6* 11.1* 11.4*  HCT 32.8* 34.3* 35.9*  WBC 3.4* 4.4 4.6  PLT 104* 115* 117*   No results found.  ASSESSMENT / PLAN:  69 y.o. WM s/p stem cell transplant on chronic immunosuppression. Likely has GVHD of the lungs given improvement on chest imaging & oxygen requirement, but we did treat w/ 7d abx given Strep bovis culture in lung. Patient remains on room air and is tolerating weaning of his steroids. Continues to have ongoing diarrhea. His ganciclovir was changed to valganciclovir  on 12/27 by infectious diseases before signing off. Given his mild improvement in BUN and creatinine today we are hopeful that renal function will continue to improve. Nephrology is following. I did speak with nephrology at bedside this morning regarding his suggestion of improvement on lab testing. The remainder of his cultures remain no  growth to date from his bronchoscopy on 12/19.  1. Acute hypoxic respiratory failure: Resolved. Likely secondary to graft-versus-host disease of the lungs. Continuing incentive spirometry while awake. 2. Graft-versus-host disease of the lungs: Signs of improvement on chest x-ray imaging and oxygen requirement. Currently on day #3 of prednisone 50 mg po bid. Patient will require a prolonged steroid taper. 3. HCAP Secondary to Strep bovis: Found on bronchoscopy. Completed course of antibiotics.  4. Diarrhea: Currently on day #2/7 of Valcyte recommended by infectious diseases. Previously treated with ganciclovir IV. 5. Acute on chronic renal failure: Slowly improving. Nephrology following. Continuing to trend BUN/creatinine daily along with urine output. Likely multifactorial azotemia from steroids. Also likely some element of toxic effect of Prograf. 6. CLL Post Transplant: Hematology & oncology previously following but has not seen the patient in a couple of days. Currently holding Prograf given toxic level (12/26 24.4). Continuing prednisone 50 mg by mouth twice a day. 7. Hyperglycemia: No history of diabetes mellitus. Continuing Lantus 4 units subcutaneous daily along with sliding scale insulin with Accu-Cheks every before meals & at bedtime.  8. Atrial fibrillation: Continuing to monitor the patient on telemetry. Previously on diltiazem with bradycardia.  9. Hypertension: Continuing labetalol 300 mg by mouth twice a day.  10. Prophylaxis: SCDs & Protonix by mouth daily.  Sonia Baller Ashok Cordia, M.D. [facility] Pulmonary & Critical [facility] Pager:  [phone] After 3pm or if no response, call [phone] 12/19/2015, 9:00 AM

## 2015-12-19 NOTE — Progress Notes (Signed)
HEMATOLOGY-ONCOLOGY PROGRESS NOTE X-COVER for Dr. Jana Hakim  SUBJECTIVE: Patient is sleeping, appears to be breathing reasonably well. Sent down and discussed his care with his wife. Chronic diarrhea related to chronic GVHD  OBJECTIVE: PHYSICAL EXAMINATION: Not performed ECOG PERFORMANCE STATUS: 3 - Symptomatic, >50% confined to bed  Filed Vitals:   12/19/15 1500 12/19/15 1636  BP: 130/75   Pulse: 71   Temp:  98.8 F (37.1 C)  Resp: 13    Filed Weights   12/17/15 0600 12/18/15 0500 12/19/15 0500  Weight: 214 lb 15.2 oz (97.5 kg) 220 lb 3.8 oz (99.9 kg) 222 lb 10.6 oz (101 kg)   LABORATORY DATA:  I have reviewed the data as listed CMP Latest Ref Rng 12/19/2015 12/18/2015 12/17/2015  Glucose 65 - 99 mg/dL 241(H) 195(H) 267(H)  BUN 6 - 20 mg/dL 136(H) 146(H) 136(H)  Creatinine 0.61 - 1.24 mg/dL 3.22(H) 3.53(H) 3.47(H)  Sodium 135 - 145 mmol/L 146(H) 143 144  Potassium 3.5 - 5.1 mmol/L 3.4(L) 3.6 3.0(L)  Chloride 101 - 111 mmol/L 106 104 102  CO2 22 - 32 mmol/L 25 24 25   Calcium 8.9 - 10.3 mg/dL 6.6(L) 6.4(LL) 6.3(LL)  Total Protein 6.5 - 8.1 g/dL - 5.1(L) 5.5(L)  Total Bilirubin 0.3 - 1.2 mg/dL - 1.3(H) 1.3(H)  Alkaline Phos 38 - 126 U/L - 91 87  AST 15 - 41 U/L - 48(H) 42(H)  ALT 17 - 63 U/L - 35 32    Lab Results  Component Value Date   WBC 4.6 12/19/2015   HGB 11.4* 12/19/2015   HCT 35.9* 12/19/2015   MCV 90.9 12/19/2015   PLT 117* 12/19/2015   NEUTROABS 4.4 12/06/2015    ASSESSMENT AND PLAN: 1. Bilateral lung infiltrates: CMV IgG positive IgM negative, pulmonary closely following patient, patient being transferred to telemetry because of improvement in his breathing chest x-ray and oxygen requirement 2. Acute on chronic renal failure: Prograf was discontinued by nephrology. Currently on steroids. We will have to determine what his long-term immunosuppressant regimen should be. If he cannot stay on Prograf, other options will need to be explored in consulting with his  transplant team in [place]. 3. CLL: In remission after a low stem cell transplant 4. Mild anemia and thrombocytopenia  We will follow the patient daily to monitor his progress.

## 2015-12-19 NOTE — Progress Notes (Signed)
[facility] Progress Note   Subjective: no complaints.  B/Cr down today first time. UOP  Remains incontinent. Tac level from two days ago up higher at 24.   Filed Vitals:   12/19/15 0500 12/19/15 0600 12/19/15 0700 12/19/15 0800  BP: 115/84 122/89 143/85 129/90  Pulse: 73 74 72 74  Temp:    97.5 F (36.4 C)  TempSrc:      Resp: 14 13 17 16   Height:      Weight: 101 kg (222 lb 10.6 oz)     SpO2: 92% 94% 95% 92%    Inpatient medications: . sodium chloride   Intravenous Once  . antiseptic oral rinse  7 mL Mouth Rinse BID  . calcitRIOL  0.25 mcg Oral Daily  . calcium gluconate  1 g Intravenous BID  . cholestyramine  4 g Oral BID  . diltiazem  240 mg Oral Daily  . feeding supplement (GLUCERNA SHAKE)  237 mL Oral BID BM  . feeding supplement (NEPRO CARB STEADY)  237 mL Oral Q24H  . fluconazole  100 mg Oral Daily  . furosemide  160 mg Oral BID  . insulin aspart  0-20 Units Subcutaneous TID WC  . insulin aspart  0-5 Units Subcutaneous QHS  . insulin glargine  40 Units Subcutaneous Daily  . labetalol  300 mg Oral BID  . loratadine  10 mg Oral Daily  . pantoprazole  40 mg Oral Daily  . potassium chloride  40 mEq Oral Once  . predniSONE  50 mg Oral BID WC  . sertraline  50 mg Oral q morning - 10a  . sodium bicarbonate  1,300 mg Oral BID  . sodium chloride  10-40 mL Intracatheter Q12H  . [START ON 12/20/2015] tacrolimus  0.5 mg Oral BID  . valGANciclovir  450 mg Oral Q48H     HYDROcodone-acetaminophen, hydrocortisone, LORazepam, menthol-cetylpyridinium, morphine injection, ondansetron (ZOFRAN) IV, prochlorperazine, sodium chloride, technetium TC 72M diethylenetriame-pentaacetic acid  Exam: Alert, tremulous, no distress No jvd Chest left base rales, R clear RRR  Abd obese, distended poss ascites 1+ hip and pedal edema bilat Neuro Ox3   UA 12/16 neg protein, no rbc/wbc  Assessment: 1 AKI tac toxicity +/- other 2 CKD stage IV creat 2.5 Nov, normal UA 3  Pulm infiltrates - resolved by last cxr, prob GHVD of lungs per pulm, responding to high-dose steroids 5 GVHD was on tac/ pred, now pred 6 BMT 2013 7 HTN labetalol / dilt 8 Hypomag from tac toxicity 9 Hypocalcemia added vit D 10 Afib on po dilt  Plan - stop lasix. B/Cr down first time today, coincident w holding prograf since Monday. Prograf is a known cause of renal failure due to severe prerenal vasconstriction. There should be reversible effect to some of his renal failure but not sure how much. Right now levels going up and would recommend holding indefinitely and looking for another agent for his GVHD and see how much renal function will recover. Have d/w Dr Alen Blew who will address. Can keep here for now, no indication for dialysis.    Kelly Splinter MD [facility] pager [phone]    cell [phone] 12/19/2015, 8:52 AM    Recent Labs Lab 12/17/15 0350 12/18/15 0453 12/19/15 0400  NA 144 143 146*  K 3.0* 3.6 3.4*  CL 102 104 106  CO2 25 24 25   GLUCOSE 267* 195* 241*  BUN 136* 146* 136*  CREATININE 3.47* 3.53* 3.22*  CALCIUM 6.3* 6.4* 6.6*  PHOS 5.3*  5.1* 4.7*    Recent Labs Lab 12/16/15 0105 12/17/15 0350 12/18/15 0453  AST 37 42* 48*  ALT 25 32 35  ALKPHOS 89 87 91  BILITOT 0.6 1.3* 1.3*  PROT 5.5* 5.5* 5.1*  ALBUMIN 2.4* 2.6* 2.5*    Recent Labs Lab 12/17/15 0350 12/18/15 0453 12/19/15 0400  WBC 3.4* 4.4 4.6  HGB 10.6* 11.1* 11.4*  HCT 32.8* 34.3* 35.9*  MCV 89.9 90.5 90.9  PLT 104* 115* 117*

## 2015-12-19 NOTE — Progress Notes (Addendum)
Patient ID: Timothy [name], male   DOB: Aug 02, 1946, 69 y.o.   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  Pt was under PCCM care and then transitioned to [facility] 12/19/2015. In short, initial hypoxia and shortness of breath thought to be due to GVHD. Pt is s/p bronch with BAL brushing and transbronchial biopsies 12/19. He was treated with abx for 7 days for strep bovis culture in the lung. He is being followed by his oncologist Dr. Jana Hakim, infectious disease and nephrology. Patient's creawtinine was as high as 3.7 on the admission (and his baseline around 2.6 in 09/2015) and this acute on chronic renal failure thought to be due to prograf which has been on hold since. He continues to have diarrhea but his C.diff was negative 12/08/2015. Additionally, CMV 12/19 showed CMV IgG positive but IgM negative. He is on valaganciclovir which he needs to continue through 12/25/2015.   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  - Continue prednisone taper per PCCM  HCAP secondary to strep bovis - This was found on bronchoscopy and has completed appropriate course of abx for this  Diarrhea - C.diff negative  CMV IgG - Continue Valcytel through 12/25/2015  Acute renal failrure superimposed on CKD stage 4 - Baseline Cr in 09/2015 was 2.6 and on this admission 3.7 - Likely from prograf. Prograf given toxic level  (12/26 24.4) - Pt seen by nephrology in consultation  CLL  - Post Transplant - Continuing prednisone 50 mg by mouth twice a day.  Depression - Continue Zoloft   Uncontrolled diabetes mellitus with diabetic nephropathy with long term insulin use - Continue current insulin regimen   Essential hypertension - Continue labetalol   Atrial fibrillation - CHADS vasc score at least 3 - Rate controlled with Cardizem  - Not on AC due to thrombocytopenia  Anemia of chronic disease / Thrombocytopenia - Due to history of CLL - Monitor CBC daily   DVT Prophylaxis  - SCD's bilaterally   Code Status: Full.  Family Communication:  plan of care discussed with the patient Disposition Plan: transfer to telemetry floor today   IV access:  Peripheral IV  Procedures and diagnostic studies:    CT Chest 12/15: bilateral airspace & ground glass opacities, atherosclerosis in coronary arteries, cardiomegaly, small R pleural effusion, non-specific gallbladder distention, trace perihepatic ascites   TTE 12/15: LV with mod to severe LVH, mild reduction of systolic fxn, EF Q000111Q, No RWMA, grade 1 diastolic dysfunction, mild mitral regurg, mild aortic regurg  VQ 12/16: LOW prob PE  Dg Chest Port 1 View 12/17/2015  There is poor inspiration with mild basilar atelectasis. No segmental infiltrate. Right IJ Port-A-Cath is   Dg Chest Port 1 View 12/16/2015 1. Stable to improved appearance of pulmonary edema pattern.   Medical Consultants:  Oncology PCCM  IAnti-Infectives:   Valcyte 12/27>>> Diflucan 12/15>>> Ganciclovir 12/21 - 12/27 Levaquin 12/20 - 12/26 Bactrim 12/17 - 12/20 Acyclovir 12/15 -  12/21   Leisa Lenz, MD  [name] Pager [phone]  Time spent in minutes: 25 minutes  If 7PM-7AM, please contact night-coverage [email or web address] Password [email or web address] 12/19/2015, 10:07 AM   LOS: 13 days    HPI/Subjective: No acute overnight events. Patient reports he feels  better.  Objective: Filed Vitals:   12/19/15 0500 12/19/15 0600 12/19/15 0700 12/19/15 0800  BP: 115/84 122/89 143/85 129/90  Pulse: 73 74 72 74  Temp:    97.5 F (36.4 C)  TempSrc:      Resp: 14 13 17 16   Height:      Weight: 101 kg (222 lb 10.6 oz)     SpO2: 92% 94% 95% 92%    Intake/Output Summary (Last 24 hours) at 12/19/15 1007 Last data filed at 12/19/15 0800  Gross per 24 hour  Intake   1090 ml  Output      0 ml  Net   1090 ml    Exam:   General:  Pt is alert, follows commands appropriately, not in acute distress  Cardiovascular: Regular rate and rhythm, S1/S2 (+)  Respiratory: no wheezing, no crackles, no rhonchi  Abdomen: Soft, non tender, non distended, bowel sounds present  Extremities: No edema, pulses DP and PT palpable bilaterally  Neuro: Grossly nonfocal  Data Reviewed: Basic Metabolic Panel:  Recent Labs Lab 12/15/15 0545 12/16/15 0105 12/17/15 0350 12/18/15 0453 12/19/15 0400  NA 143 146* 144 143 146*  K 3.5 3.2* 3.0* 3.6 3.4*  CL 102 105 102 104 106  CO2 24 25 25 24 25   GLUCOSE 295* 179* 267* 195* 241*  BUN 126* 111* 136* 146* 136*  CREATININE 3.70* 3.49* 3.47* 3.53* 3.22*  CALCIUM 6.4* 6.5* 6.3* 6.4* 6.6*  MG 1.6* 1.5* 1.6* 2.1 1.9  PHOS 6.2* 5.4* 5.3* 5.1* 4.7*   Liver Function Tests:  Recent Labs Lab 12/14/15 0430 12/15/15 0545 12/16/15 0105 12/17/15 0350 12/18/15 0453  AST 23 33 37 42* 48*  ALT 18 23 25  32 35  ALKPHOS 90 90 89 87 91  BILITOT 0.6 0.3 0.6 1.3* 1.3*  PROT 6.0* 5.4* 5.5* 5.5* 5.1*  ALBUMIN 2.5* 2.5* 2.4* 2.6* 2.5*   No results for input(s): LIPASE, AMYLASE in the last 168 hours. No results for input(s): AMMONIA in the last 168 hours. CBC:  Recent Labs Lab 12/15/15 0545 12/16/15 0105 12/17/15 0350 12/18/15 0453 12/19/15 0400  WBC 3.8* 3.5* 3.4* 4.4 4.6  HGB 10.1* 10.1* 10.6* 11.1* 11.4*  HCT 30.5* 30.6* 32.8* 34.3* 35.9*  MCV 89.2 89.2 89.9 90.5 90.9  PLT 89* 94* 104* 115* 117*   Cardiac  Enzymes:  Recent Labs Lab 12/15/15 1255 12/15/15 1855 12/16/15 0105  TROPONINI 0.41* 0.52* 0.50*   BNP: Invalid input(s): POCBNP CBG:  Recent Labs Lab 12/18/15 0507 12/18/15 1052 12/18/15 1540 12/18/15 2135 12/19/15 0759  GLUCAP 188* 220* 179* 206* 220*    Recent Results (from the past 240 hour(s))  AFB culture with smear     Status: None (Preliminary result)   Collection Time: 12/10/15 11:33 AM  Result Value Ref Range Status   Specimen Description BRONCHIAL ALVEOLAR LAVAGE  Final   Special Requests Immunocompromised  Final   Acid Fast Smear   Final    NO ACID FAST BACILLI SEEN Performed at [facility]    Culture   Final    CULTURE WILL BE EXAMINED FOR 6 WEEKS BEFORE ISSUING A FINAL REPORT Performed at [facility]    Report Status PENDING  Incomplete  Culture, bal-quantitative     Status: None   Collection Time: 12/10/15 11:35 AM  Result Value Ref Range Status   Specimen Description BRONCHIAL ALVEOLAR LAVAGE  Final   Special Requests Immunocompromised  Final   Gram Stain   Final    MODERATE WBC PRESENT, PREDOMINANTLY MONONUCLEAR RARE SQUAMOUS EPITHELIAL CELLS PRESENT NO ORGANISMS SEEN Performed at [place]   Final    35,000 COLONIES/ML Performed at [facility]    Culture   Final    NORMAL OROPHARYNGEAL FLORA Performed at [facility]    Report Status 12/12/2015 FINAL  Final  Fungus Culture with Smear     Status: None (Preliminary result)   Collection Time: 12/10/15 11:38 AM  Result Value Ref Range Status   Specimen Description BRONCHIAL ALVEOLAR LAVAGE  Final   Special Requests Immunocompromised  Final   Fungal Smear   Final    NO YEAST OR FUNGAL ELEMENTS SEEN Performed at [facility]    Culture   Final    CULTURE IN PROGRESS FOR FOUR WEEKS Performed at [facility]    Report Status PENDING  Incomplete  Pneumocystis smear by DFA     Status: None   Collection Time:  12/10/15 11:39 AM  Result Value Ref Range Status   Specimen Source-PJSRC BRONCHIAL ALVEOLAR LAVAGE  Final   Pneumocystis jiroveci Ag NEGATIVE  Final    Comment: Performed at [place] [facility]  Tissue culture     Status: None   Collection Time: 12/10/15 11:40 AM  Result Value Ref Range Status   Specimen Description BIOPSY LUNG TRANSBRONCHIAL  Final   Special Requests Immunocompromised  Final   Gram Stain   Final    NO WBC SEEN NO ORGANISMS SEEN Performed at [facility]    Culture   Final    RARE STREPTOCOCCUS GROUP D;high probability for S.bovis Note: Gram Stain Report Called to,Read Back By and Verified With: [facility] TORRES 12/10/15 @ 0707P YIMSU Performed at [facility]    Report Status 12/16/2015 FINAL  Final   Organism ID, Bacteria STREPTOCOCCUS GROUP D;high probability for S.bovis  Final      Susceptibility   Streptococcus group d;high probability for s.bovis - MIC (ETEST)*    PENICILLIN <=0.125 SENSITIVE Sensitive     * RARE STREPTOCOCCUS GROUP D;high probability for S.bovis  MRSA PCR Screening     Status: None   Collection Time: 12/12/15  9:46 AM  Result Value Ref Range Status   MRSA by PCR NEGATIVE NEGATIVE Final     Scheduled Meds: . calcitRIOL  0.25 mcg Oral Daily  . calcium gluconate  1 g Intravenous BID  . cholestyramine  4 g Oral BID  . diltiazem  240 mg Oral Daily  .  (GLUCERNA SHAKE)  237 mL Oral BID BM  .  (NEPRO CARB   237 mL Oral Q24H  . fluconazole  100 mg Oral Daily  . insulin aspart  0-20 Units Subcutaneous TID WC  . insulin aspart  0-5 Units Subcutaneous QHS  . insulin glargine  40 Units Subcutaneous Daily  . labetalol  300 mg Oral BID  . loratadine  10 mg Oral Daily  . pantoprazole  40 mg Oral Daily  . potassium chloride  40 mEq Oral Once  . predniSONE  50 mg Oral BID WC  . sertraline  50 mg Oral q morning - 10a  . sodium bicarbonate  1,300 mg  Oral BID  . valGANciclovir  450 mg Oral Q48H

## 2015-12-20 DIAGNOSIS — D89812 Acute on chronic graft-versus-host disease: Secondary | ICD-10-CM | POA: Diagnosis present

## 2015-12-20 LAB — RENAL FUNCTION PANEL
ANION GAP: 16 — AB (ref 5–15)
Albumin: 2.4 g/dL — ABNORMAL LOW (ref 3.5–5.0)
BUN: 128 mg/dL — AB (ref 6–20)
CHLORIDE: 105 mmol/L (ref 101–111)
CO2: 24 mmol/L (ref 22–32)
Calcium: 6.7 mg/dL — ABNORMAL LOW (ref 8.9–10.3)
Creatinine, Ser: 3.1 mg/dL — ABNORMAL HIGH (ref 0.61–1.24)
GFR, EST AFRICAN AMERICAN: 22 mL/min — AB (ref >60)
GFR, EST NON AFRICAN AMERICAN: 19 mL/min — AB (ref >60)
Glucose, Bld: 203 mg/dL — ABNORMAL HIGH (ref 65–99)
POTASSIUM: 3.5 mmol/L (ref 3.5–5.1)
Phosphorus: 4.1 mg/dL (ref 2.5–4.6)
Sodium: 145 mmol/L (ref 135–145)

## 2015-12-20 LAB — GLUCOSE, CAPILLARY
GLUCOSE-CAPILLARY: 204 mg/dL — AB (ref 65–99)
GLUCOSE-CAPILLARY: 215 mg/dL — AB (ref 65–99)
Glucose-Capillary: 154 mg/dL — ABNORMAL HIGH (ref 65–99)
Glucose-Capillary: 151 mg/dL — ABNORMAL HIGH (ref 65–99)

## 2015-12-20 LAB — CBC
HCT: 36.6 % — ABNORMAL LOW (ref 39.0–52.0)
HEMOGLOBIN: 11.6 g/dL — AB (ref 13.0–17.0)
MCH: 28.9 pg (ref 26.0–34.0)
MCHC: 31.7 g/dL (ref 30.0–36.0)
MCV: 91 fL (ref 78.0–100.0)
Platelets: 126 10*3/uL — ABNORMAL LOW (ref 150–400)
RBC: 4.02 MIL/uL — ABNORMAL LOW (ref 4.22–5.81)
RDW: 15.8 % — ABNORMAL HIGH (ref 11.5–15.5)
WBC: 5.3 10*3/uL (ref 4.0–10.5)

## 2015-12-20 LAB — MAGNESIUM: Magnesium: 1.9 mg/dL (ref 1.7–2.4)

## 2015-12-20 LAB — PHOSPHORUS: PHOSPHORUS: 4.1 mg/dL (ref 2.5–4.6)

## 2015-12-20 NOTE — Progress Notes (Signed)
Name: Timothy [name] MRN: [id number] DOB: November 26, 1946    ADMISSION DATE:  12/06/2015 CONSULTATION DATE:  12/07/15  REFERRING MD :  Dr. 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    SIGNIFICANT EVENTS  12/15 - Admit from [place] with multiple c/o's - more diarrhea, SOB, slurred speech, low blood sugars 12/19 - FOB  MICROBIOLOGY:  CMV PCR 12/27>>> BAL 12/19:  normal flora  BAL AFB 12/19>>> smear NEG  BAL Fungus 12/19>>> smear NEG Legionella 12/19>>> PCP 12/19: NEG  CMV 12/19:CMV IgG positive but IgM negative FOB tissue culture 12/19: S bovis Stool C diff 12/17:  Negative  STUDIES:  CT Chest 12/15: bilateral airspace & ground glass opacities, atherosclerosis in coronary arteries, cardiomegaly, small R pleural effusion, non-specific gallbladder distention, trace perihepatic ascites  TTE 12/15: LV with mod to severe LVH, mild reduction of systolic fxn, EF Q000111Q, No RWMA, grade 1 diastolic dysfunction, mild mitral regurg, mild aortic regurg VQ 12/16: LOW prob PE Port CXR 12/26:  Patchy bilateral opacities. Some improvement compared with prior CXR. No pleural effusion appreciated with low lung volumes.  ANTIBIOTICS: Valcyte 12/27>>> Diflucan 12/15>>> Ganciclovir 12/21 - 12/27 Levaquin 12/20 - 12/26 Bactrim 12/17 - 12/20 Acyclovir 12/15 - 12/21  SUBJECTIVE: No acute events overnight. Patient reports breathing is normal. Denies any significant cough. Denies any subjective fever or chills.  REVIEW OF SYSTEMS:  No chest pain or pressure. Reports nausea persists. Diarrhea persists as well without significant abdominal pain.Marland Kitchen  VITAL SIGNS: Temp:  [97.5 F (36.4 C)-98.2 F (36.8 C)] 97.5 F (36.4 C) (12/29 1530) Pulse  Rate:  [52-75] 72 (12/29 1500) Resp:  [11-20] 14 (12/29 1500) BP: (106-167)/(62-102) 106/85 mmHg (12/29 1500) SpO2:  [93 %-98 %] 95 % (12/29 1500) Weight:  [214 lb 1.1 oz (97.1 kg)] 214 lb 1.1 oz (97.1 kg) (12/29 0434)  PHYSICAL EXAMINATION: General:  Awake. No distress. Alert.  Neuro:  Grossly nonfocal. No meningismus. Moving all 4 extremities equally. HEENT:  No oral ulcers. No scleral icterus. Moist mucous membranes. Cardiovascular: Regular rhythm. Minimal edema. Normal S1 & S2. Lungs:  Minimal basilar crackles. Normal respiratory effort on room air.    Abdomen:  Soft. Nontender. Nondistended. Integument:  Bruising of various ages. No rash on exposed skin. Warm & dry.   Recent Labs Lab 12/18/15 0453 12/19/15 0400 12/20/15 0830  NA 143 146* 145  K 3.6 3.4* 3.5  CL 104 106 105  CO2 24 25 24   BUN 146* 136* 128*  CREATININE 3.53* 3.22* 3.10*  GLUCOSE 195* 241* 203*    Recent Labs Lab 12/18/15 0453 12/19/15 0400 12/20/15 0425  HGB 11.1* 11.4* 11.6*  HCT 34.3* 35.9* 36.6*  WBC 4.4 4.6 5.3  PLT 115* 117* 126*   No results found.  ASSESSMENT / PLAN:  69 y.o. WM s/p stem cell transplant on chronic immunosuppression. Likely has GVHD of the lungs given improvement on chest imaging & oxygen requirement, but we did treat w/ 7d abx given Strep bovis culture in lung. Patient remains on room air and is tolerating weaning of his steroids. Continues to have ongoing diarrhea. His ganciclovir was changed to valganciclovir on 12/27 by infectious diseases before signing off. The patient's renal function continues to improve. He will require close follow-up as we further taper  his steroid regimen.  1. Acute hypoxic respiratory failure: Resolved. Likely secondary to graft-versus-host disease of the lungs. Continuing incentive spirometry while awake. 2. Graft-versus-host disease of the lungs: Signs of improvement on chest x-ray imaging and oxygen requirement. Currently on day #7 of steroids  currently on Prednisone 50 mg po bid. Plan to repeat CT chest & full pulmonary function testing at day #14 prior to beginning Prednisone taper. 3. HCAP Secondary to Strep bovis: Found on bronchoscopy. Completed course of antibiotics.  4. Diarrhea: Currently on day #3/7 of Valcyte recommended by infectious diseases. Previously treated with ganciclovir IV. 5. Acute on chronic renal failure: Slowly improving. Nephrology following. Continuing to trend BUN/creatinine daily along with urine output. Likely multifactorial azotemia from steroids. Also likely some element of toxic effect of Prograf. 6. CLL Post Transplant: Hematology & oncology following. Prograf at toxic level (12/26 24.4). Continuing prednisone 50 mg by mouth twice a day. Additional immunosuppression per their recommendations. 7. Disposition:  If the patient is discharged before Day #14 of steroids he should have followup on Day #14 with repeat CT Chest w/o contrast & full pulmonary function testing to guide our taper regimen which will occur over 3-6 months.  Sonia Baller Ashok Cordia, M.D. [facility] Pager:  [phone] After 3pm or if no response, call [phone] 12/20/2015, 6:31 PM

## 2015-12-20 NOTE — Progress Notes (Addendum)
Patient ID: Timothy [name], male   DOB: [birth year], 69 y.o.   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  Pt was under PCCM care and then transitioned to [facility] 12/19/2015. In short, initial hypoxia and shortness of breath thought to be due to GVHD. Pt is s/p bronch with BAL brushing and transbronchial biopsies 12/19. He was treated with abx for 7 days for strep bovis culture in the lung. He is being followed by his oncologist Dr. Jana Hakim, infectious disease and nephrology. Patient's creawtinine was as high as 3.7 on the admission (and his baseline around 2.6 in 09/2015) and this acute on chronic renal failure thought to be due to prograf which has been on hold since. He continues to have diarrhea but his C.diff was negative 12/08/2015. Additionally, CMV 12/19 showed CMV IgG positive but IgM negative. He is on valaganciclovir which he needs to continue through 12/25/2015.   Assessment/Plan:    Acute respiratory failure with hypoxemia - Likely due to GVHD - Now resolved - Stable respiratory status - Continuing incentive spirometry while awake  Graft-versus-host disease of the lungs - Improving chest x-ray imaging and oxygen requirement.  - Continue prednisone taper per pulmonary  HCAP secondary to strep bovis - Found on bronchoscopy and has completed appropriate course of abx for this  Diarrhea - C.diff was negative - Diarrhea improved   CMV IgG - Continue Valcytel per ID recommendations through 12/25/2015  Acute renal failrure superimposed on CKD stage 4 / hypocalcemia - Baseline Cr in 09/2015 was 2.6 and on this admission 3.7 - Likely from prograf. Prograf  given toxic level (12/26 24.4) and has been stopped. - Pt seen by nephrology in consultation - Continue current medications recommended by nephrology, Calcitrol, calcium gluconate, sodium bicarbonate  CLL  - Post Transplant - Continuing prednisone 50 mg by mouth twice a day (day 7 today) - Taper prednisone per pulmonary recommendations  Depression - Continue Zoloft  - Stable. Patient is not depressed  Uncontrolled diabetes mellitus with diabetic nephropathy with long term insulin use - Continue current insulin regimen  - CBGs in past 24 hours: 144, 204, 215  Essential hypertension - Continue labetalol 300 mg twice daily along with Cardizem 240 mg daily  - Pressure 141/89  Atrial fibrillation - CHADS vasc score at least 3 - Rate controlled with Cardizem  - Not on AC due to thrombocytopenia  Anemia of chronic disease / Thrombocytopenia - Due to history of CLL - Hemoglobin is 11.6 and platelet count is 126, stable. - No reports of bleeding  DVT Prophylaxis  - SCD's bilaterally in hospital  Code Status: Full.  Family Communication:  plan of care discussed with the patient Disposition Plan: transfer to telemetry floor once bed available   IV access:  Peripheral IV  Procedures and diagnostic studies:    CT Chest 12/15: bilateral airspace & ground glass opacities, atherosclerosis in coronary arteries, cardiomegaly, small R pleural effusion, non-specific gallbladder distention, trace perihepatic ascites   TTE 12/15: LV with mod to severe LVH, mild reduction of systolic fxn, EF [id number], No RWMA, grade 1 diastolic dysfunction, mild mitral regurg, mild aortic regurg  VQ 12/16: LOW prob PE  Dg Chest  Port 1 View 12/17/2015  There is poor inspiration with mild basilar atelectasis. No segmental infiltrate. Right IJ Port-A-Cath is   Dg Chest Port 1 View 12/16/2015 1. Stable to improved appearance of pulmonary edema pattern.   Medical Consultants:   Oncology PCCM  IAnti-Infectives:   Valcyte 12/27>>> Diflucan 12/15>>> Ganciclovir 12/21 - 12/27 Levaquin 12/20 - 12/26 Bactrim 12/17 - 12/20 Acyclovir 12/15 - 12/21   Leisa Lenz, MD  [name] Pager [phone]  Time spent in minutes: 25 minutes  If 7PM-7AM, please contact night-coverage [email or web address] Password [email or web address] 12/20/2015, 7:28 AM   LOS: 14 days    HPI/Subjective: No acute overnight events. Patient reports he slept better.  Objective: Filed Vitals:   12/20/15 0427 12/20/15 0434 12/20/15 0500 12/20/15 0600  BP:   150/86 112/62  Pulse:   68 52  Temp: 98.2 F (36.8 C)     TempSrc: Oral     Resp:   15 11  Height:      Weight:  97.1 kg (214 lb 1.1 oz)    SpO2:   93% 97%    Intake/Output Summary (Last 24 hours) at 12/20/15 [id number] Last data filed at 12/20/15 0000  Gross per 24 hour  Intake   3450 ml  Output    675 ml  Net   2775 ml    Exam:   General:  Pt is alert, not in acute distress  Cardiovascular: Rate controlled, appreciate S1, S2   Respiratory: bilateral air entry, no wheezing  Abdomen: (+) BS, non tender   Extremities: No swelling, palpable pulses   Neuro: Nonfocal  Data Reviewed: Basic Metabolic Panel:  Recent Labs Lab 12/15/15 0545 12/16/15 0105 12/17/15 0350 12/18/15 0453 12/19/15 0400 12/20/15 0425  NA 143 146* 144 143 146*  --   K 3.5 3.2* 3.0* 3.6 3.4*  --   CL 102 105 102 104 106  --   CO2 24 25 25 24 25   --   GLUCOSE 295* 179* 267* 195* 241*  --   BUN 126* 111* 136* 146* 136*  --   CREATININE 3.70* 3.49* 3.47* 3.53* 3.22*  --   CALCIUM 6.4* 6.5* 6.3* 6.4* 6.6*  --   MG 1.6* 1.5* 1.6* 2.1 1.9 1.9  PHOS 6.2* 5.4* 5.3* 5.1* 4.7* 4.1   Liver Function Tests:  Recent Labs Lab 12/14/15 0430 12/15/15 0545 12/16/15 0105 12/17/15 0350 12/18/15 0453  AST 23 33 37 42* 48*  ALT 18 23 25  32 35  ALKPHOS 90 90 89 87 91  BILITOT 0.6 0.3 0.6 1.3* 1.3*  PROT 6.0* 5.4* 5.5* 5.5* 5.1*  ALBUMIN 2.5* 2.5* 2.4* 2.6*  2.5*   No results for input(s): LIPASE, AMYLASE in the last 168 hours. No results for input(s): AMMONIA in the last 168 hours. CBC:  Recent Labs Lab 12/16/15 0105 12/17/15 0350 12/18/15 0453 12/19/15 0400 12/20/15 0425  WBC 3.5* 3.4* 4.4 4.6 5.3  HGB 10.1* 10.6* 11.1* 11.4* 11.6*  HCT 30.6* 32.8* 34.3* 35.9* 36.6*  MCV 89.2 89.9 90.5 90.9 91.0  PLT 94* 104* 115* 117* 126*   Cardiac Enzymes:  Recent Labs Lab 12/15/15 1255 12/15/15 1855 12/16/15 0105  TROPONINI 0.41* 0.52* 0.50*   BNP: Invalid input(s): POCBNP CBG:  Recent Labs Lab 12/18/15 2135 12/19/15 0759 12/19/15 1138 12/19/15 1629 12/19/15 2120  GLUCAP 206* 220* 216* 157* 144*    Recent Results (from the past 240 hour(s))  AFB culture with smear     Status: None (Preliminary result)   Collection  Time: 12/10/15 11:33 AM  Result Value Ref Range Status   Specimen Description BRONCHIAL ALVEOLAR LAVAGE  Final   Special Requests Immunocompromised  Final   Acid Fast Smear   Final    NO ACID FAST BACILLI SEEN Performed at [facility]    Culture   Final    CULTURE WILL BE EXAMINED FOR 6 WEEKS BEFORE ISSUING A FINAL REPORT Performed at [facility]    Report Status PENDING  Incomplete  Culture, bal-quantitative     Status: None   Collection Time: 12/10/15 11:35 AM  Result Value Ref Range Status   Specimen Description BRONCHIAL ALVEOLAR LAVAGE  Final   Special Requests Immunocompromised  Final   Gram Stain   Final    MODERATE WBC PRESENT, PREDOMINANTLY MONONUCLEAR RARE SQUAMOUS EPITHELIAL CELLS PRESENT NO ORGANISMS SEEN Performed at [place]   Final    35,000 COLONIES/ML Performed at [facility]    Culture   Final    NORMAL OROPHARYNGEAL FLORA Performed at [facility]    Report Status 12/12/2015 FINAL  Final  Fungus Culture with Smear     Status: None (Preliminary result)   Collection Time: 12/10/15 11:38 AM  Result Value Ref Range  Status   Specimen Description BRONCHIAL ALVEOLAR LAVAGE  Final   Special Requests Immunocompromised  Final   Fungal Smear   Final    NO YEAST OR FUNGAL ELEMENTS SEEN Performed at [facility]    Culture   Final    CULTURE IN PROGRESS FOR FOUR WEEKS Performed at [facility]    Report Status PENDING  Incomplete  Pneumocystis smear by DFA     Status: None   Collection Time: 12/10/15 11:39 AM  Result Value Ref Range Status   Specimen Source-PJSRC BRONCHIAL ALVEOLAR LAVAGE  Final   Pneumocystis jiroveci Ag NEGATIVE  Final    Comment: Performed at [facility]  Tissue culture     Status: None   Collection Time: 12/10/15 11:40 AM  Result Value Ref Range Status   Specimen Description BIOPSY LUNG TRANSBRONCHIAL  Final   Special Requests Immunocompromised  Final   Gram Stain   Final    NO WBC SEEN NO ORGANISMS SEEN Performed at [facility]    Culture   Final    RARE STREPTOCOCCUS GROUP D;high probability for S.bovis Note: Gram Stain Report Called to,Read Back By and Verified With: [facility] TORRES 12/10/15 @ 0707P YIMSU Performed at [facility]    Report Status 12/16/2015 FINAL  Final   Organism ID, Bacteria STREPTOCOCCUS GROUP D;high probability for S.bovis  Final      Susceptibility   Streptococcus group d;high probability for s.bovis - MIC (ETEST)*    PENICILLIN <=0.125 SENSITIVE Sensitive     * RARE STREPTOCOCCUS GROUP D;high probability for S.bovis  MRSA PCR Screening     Status: None   Collection Time: 12/12/15  9:46 AM  Result Value Ref Range Status   MRSA by PCR NEGATIVE NEGATIVE Final     Scheduled Meds: . calcitRIOL  0.25 mcg Oral Daily  . calcium gluconate  1 g Intravenous BID  . cholestyramine  4 g Oral BID  . diltiazem  240 mg Oral Daily  .  (GLUCERNA SHAKE)  237 mL Oral BID BM  .  (NEPRO CARB   237 mL Oral Q24H  . fluconazole  100 mg Oral Daily  . insulin aspart  0-20 Units Subcutaneous TID WC  . insulin aspart   0-5 Units Subcutaneous QHS  . insulin glargine  40 Units Subcutaneous Daily  . labetalol  300 mg Oral BID  . loratadine  10 mg Oral Daily  . pantoprazole  40 mg Oral Daily  . potassium chloride  40 mEq Oral Once  . predniSONE  50 mg Oral BID WC  . sertraline  50 mg Oral q morning - 10a  . sodium bicarbonate  1,300 mg Oral BID  . valGANciclovir  450 mg Oral Q48H

## 2015-12-20 NOTE — Progress Notes (Signed)
[facility] Progress Note   Subjective: no complaitns, feels good, appetite ok, no cough or SOB  Filed Vitals:   12/20/15 0600 12/20/15 0700 12/20/15 0800 12/20/15 0900  BP: 112/62 145/80 155/86 123/86  Pulse: 52 68 70 68  Temp:   97.5 F (36.4 C)   TempSrc:   Oral   Resp: 11 18 19 19   Height:      Weight:      SpO2: 97% 93% 96% 96%    Inpatient medications: . sodium chloride   Intravenous Once  . antiseptic oral rinse  7 mL Mouth Rinse BID  . calcitRIOL  0.25 mcg Oral Daily  . calcium gluconate  1 g Intravenous BID  . cholestyramine  4 g Oral BID  . diltiazem  240 mg Oral Daily  . feeding supplement (GLUCERNA SHAKE)  237 mL Oral BID BM  . feeding supplement (NEPRO CARB STEADY)  237 mL Oral Q24H  . fluconazole  100 mg Oral Daily  . insulin aspart  0-20 Units Subcutaneous TID WC  . insulin aspart  0-5 Units Subcutaneous QHS  . insulin glargine  50 Units Subcutaneous Daily  . labetalol  300 mg Oral BID  . loratadine  10 mg Oral Daily  . pantoprazole  40 mg Oral Daily  . potassium chloride  40 mEq Oral Once  . predniSONE  50 mg Oral BID WC  . sertraline  50 mg Oral q morning - 10a  . sodium bicarbonate  1,300 mg Oral BID  . sodium chloride  10-40 mL Intracatheter Q12H  . valGANciclovir  450 mg Oral Q48H     HYDROcodone-acetaminophen, hydrocortisone, LORazepam, menthol-cetylpyridinium, morphine injection, ondansetron (ZOFRAN) IV, prochlorperazine, sodium chloride, technetium TC 37M diethylenetriame-pentaacetic acid  Exam: Alert, tremulous, no distress No jvd Chest left base rales, R clear RRR  Abd obese, distended poss ascites 1+ hip and pedal edema bilat Neuro Ox3   UA 12/16 neg protein, no rbc/wbc  Assessment: 1 AKI/ CKD 4 - tac toxicity prob big contributer to this, have dc'd tacrolimus and d/w oncology. B/Cr decreasing.  2 CKD stage IV creat 2.5 Nov, normal UA 3 Pulm infiltrates - per pulm prob GHVD of lungs, responded to high-dose steroids.  CXR now normal 5 GVHD was on tac/ pred, now pred only 6 BMT 2013 7 HTN labetalol / dilt 8 Hypomag from tac toxicity 9 Hypocalcemia added vit D 10 Afib on po dilt 11 Vol wt's down, some edema, clear cxr. Holding lasix  Plan - daily creat, pred for GVHD   Kelly Splinter MD [facility] pager [phone]    cell [phone] 12/20/2015, 11:23 AM    Recent Labs Lab 12/18/15 0453 12/19/15 0400 12/20/15 0425 12/20/15 0830  NA 143 146*  --  145  K 3.6 3.4*  --  3.5  CL 104 106  --  105  CO2 24 25  --  24  GLUCOSE 195* 241*  --  203*  BUN 146* 136*  --  128*  CREATININE 3.53* 3.22*  --  3.10*  CALCIUM 6.4* 6.6*  --  6.7*  PHOS 5.1* 4.7* 4.1 4.1    Recent Labs Lab 12/16/15 0105 12/17/15 0350 12/18/15 0453 12/20/15 0830  AST 37 42* 48*  --   ALT 25 32 35  --   ALKPHOS 89 87 91  --   BILITOT 0.6 1.3* 1.3*  --   PROT 5.5* 5.5* 5.1*  --   ALBUMIN 2.4* 2.6* 2.5* 2.4*  Recent Labs Lab 12/18/15 0453 12/19/15 0400 12/20/15 0425  WBC 4.4 4.6 5.3  HGB 11.1* 11.4* 11.6*  HCT 34.3* 35.9* 36.6*  MCV 90.5 90.9 91.0  PLT 115* 117* 126*

## 2015-12-21 ENCOUNTER — Telehealth: Payer: Self-pay | Admitting: *Deleted

## 2015-12-21 LAB — GLUCOSE, CAPILLARY
GLUCOSE-CAPILLARY: 199 mg/dL — AB (ref 65–99)
GLUCOSE-CAPILLARY: 194 mg/dL — AB (ref 65–99)
GLUCOSE-CAPILLARY: 221 mg/dL — AB (ref 65–99)
GLUCOSE-CAPILLARY: 232 mg/dL — AB (ref 65–99)

## 2015-12-21 LAB — RENAL FUNCTION PANEL
ANION GAP: 12 (ref 5–15)
Albumin: 2.4 g/dL — ABNORMAL LOW (ref 3.5–5.0)
BUN: 120 mg/dL — ABNORMAL HIGH (ref 6–20)
CHLORIDE: 106 mmol/L (ref 101–111)
CO2: 27 mmol/L (ref 22–32)
Calcium: 7 mg/dL — ABNORMAL LOW (ref 8.9–10.3)
Creatinine, Ser: 2.7 mg/dL — ABNORMAL HIGH (ref 0.61–1.24)
GFR calc non Af Amer: 22 mL/min — ABNORMAL LOW (ref >60)
GFR, EST AFRICAN AMERICAN: 26 mL/min — AB (ref >60)
Glucose, Bld: 200 mg/dL — ABNORMAL HIGH (ref 65–99)
POTASSIUM: 3.3 mmol/L — AB (ref 3.5–5.1)
Phosphorus: 3.7 mg/dL (ref 2.5–4.6)
Sodium: 145 mmol/L (ref 135–145)

## 2015-12-21 LAB — CMV DNA, QUANTITATIVE, PCR
CMV DNA QUANT: NEGATIVE [IU]/mL
LOG10 CMV QN DNA PL: UNDETERMINED {Log_IU}/mL

## 2015-12-21 LAB — MAGNESIUM: Magnesium: 2 mg/dL (ref 1.7–2.4)

## 2015-12-21 LAB — PHOSPHORUS: Phosphorus: 3.6 mg/dL (ref 2.5–4.6)

## 2015-12-21 MED ORDER — LABETALOL HCL 300 MG PO TABS: 300.0000 mg | ORAL_TABLET | Freq: Two times a day (BID) | ORAL | Status: AC

## 2015-12-21 MED ORDER — SODIUM BICARBONATE 650 MG PO TABS: 1300.0000 mg | ORAL_TABLET | Freq: Every day | ORAL | Status: AC

## 2015-12-21 MED ORDER — PREDNISONE 50 MG PO TABS: 50.0000 mg | ORAL_TABLET | Freq: Two times a day (BID) | ORAL | Status: DC

## 2015-12-21 MED ORDER — NEPRO/CARBSTEADY PO LIQD: 237.0000 mL | ORAL | Status: AC

## 2015-12-21 MED ORDER — DILTIAZEM HCL ER COATED BEADS 240 MG PO CP24: 240.0000 mg | ORAL_CAPSULE | Freq: Every day | ORAL | Status: AC

## 2015-12-21 MED ORDER — PROCHLORPERAZINE MALEATE 10 MG PO TABS: 10.0000 mg | ORAL_TABLET | Freq: Four times a day (QID) | ORAL | Status: AC | PRN

## 2015-12-21 MED ORDER — POTASSIUM CHLORIDE CRYS ER 20 MEQ PO TBCR
40.0000 meq | EXTENDED_RELEASE_TABLET | Freq: Once | ORAL | Status: AC
  Administered 2015-12-21: 40 meq via ORAL
  Filled 2015-12-21: qty 2

## 2015-12-21 MED ORDER — CALCITRIOL 0.25 MCG PO CAPS: 0.2500 ug | ORAL_CAPSULE | Freq: Every day | ORAL | Status: DC

## 2015-12-21 MED ORDER — HYDROCORTISONE 2.5 % RE CREA: TOPICAL_CREAM | Freq: Three times a day (TID) | RECTAL | Status: AC | PRN

## 2015-12-21 MED ORDER — GLUCERNA SHAKE PO LIQD: 237.0000 mL | Freq: Two times a day (BID) | ORAL | Status: AC

## 2015-12-21 MED ORDER — VALGANCICLOVIR HCL 450 MG PO TABS: 450.0000 mg | ORAL_TABLET | ORAL | Status: DC

## 2015-12-21 NOTE — Discharge Instructions (Signed)
Prednisone tablets What is this medicine? PREDNISONE (PRED ni sone) is a corticosteroid. It is commonly used to treat inflammation of the skin, joints, lungs, and other organs. Common conditions treated include asthma, allergies, and arthritis. It is also used for other conditions, such as blood disorders and diseases of the adrenal glands. This medicine may be used for other purposes; ask your health care provider or pharmacist if you have questions. What should I tell my health care provider before I take this medicine? They need to know if you have any of these conditions: -Cushing's syndrome -diabetes -glaucoma -heart disease -high blood pressure -infection (especially a virus infection such as chickenpox, cold sores, or herpes) -kidney disease -liver disease -mental illness -myasthenia gravis -osteoporosis -seizures -stomach or intestine problems -thyroid disease -an unusual or allergic reaction to lactose, prednisone, other medicines, foods, dyes, or preservatives -pregnant or trying to get pregnant -breast-feeding How should I use this medicine? Take this medicine by mouth with a glass of water. Follow the directions on the prescription label. Take this medicine with food. If you are taking this medicine once a day, take it in the morning. Do not take more medicine than you are told to take. Do not suddenly stop taking your medicine because you may develop a severe reaction. Your doctor will tell you how much medicine to take. If your doctor wants you to stop the medicine, the dose may be slowly lowered over time to avoid any side effects. Talk to your pediatrician regarding the use of this medicine in children. Special care may be needed. Overdosage: If you think you have taken too much of this medicine contact a poison control center or emergency room at once. NOTE: This medicine is only for you. Do not share this medicine with others. What if I miss a dose? If you miss a dose,  take it as soon as you can. If it is almost time for your next dose, talk to your doctor or health care professional. You may need to miss a dose or take an extra dose. Do not take double or extra doses without advice. What may interact with this medicine? Do not take this medicine with any of the following medications: -metyrapone -mifepristone This medicine may also interact with the following medications: -aminoglutethimide -amphotericin B -aspirin and aspirin-like medicines -barbiturates -certain medicines for diabetes, like glipizide or glyburide -cholestyramine -cholinesterase inhibitors -cyclosporine -digoxin -diuretics -ephedrine -male hormones, like estrogens and birth control pills -isoniazid -ketoconazole -NSAIDS, medicines for pain and inflammation, like ibuprofen or naproxen -phenytoin -rifampin -toxoids -vaccines -warfarin This list may not describe all possible interactions. Give your health care provider a list of all the medicines, herbs, non-prescription drugs, or dietary supplements you use. Also tell them if you smoke, drink alcohol, or use illegal drugs. Some items may interact with your medicine. What should I watch for while using this medicine? Visit your doctor or health care professional for regular checks on your progress. If you are taking this medicine over a prolonged period, carry an identification card with your name and address, the type and dose of your medicine, and your doctor's name and address. This medicine may increase your risk of getting an infection. Tell your doctor or health care professional if you are around anyone with measles or chickenpox, or if you develop sores or blisters that do not heal properly. If you are going to have surgery, tell your doctor or health care professional that you have taken this medicine within the last   twelve months. Ask your doctor or health care professional about your diet. You may need to lower the amount  of salt you eat. This medicine may affect blood sugar levels. If you have diabetes, check with your doctor or health care professional before you change your diet or the dose of your diabetic medicine. What side effects may I notice from receiving this medicine? Side effects that you should report to your doctor or health care professional as soon as possible: -allergic reactions like skin rash, itching or hives, swelling of the face, lips, or tongue -changes in emotions or moods -changes in vision -depressed mood -eye pain -fever or chills, cough, sore throat, pain or difficulty passing urine -increased thirst -swelling of ankles, feet Side effects that usually do not require medical attention (report to your doctor or health care professional if they continue or are bothersome): -confusion, excitement, restlessness -headache -nausea, vomiting -skin problems, acne, thin and shiny skin -trouble sleeping -weight gain This list may not describe all possible side effects. Call your doctor for medical advice about side effects. You may report side effects to FDA at 1-800-FDA-1088. Where should I keep my medicine? Keep out of the reach of children. Store at room temperature between 15 and 30 degrees C (59 and 86 degrees F). Protect from light. Keep container tightly closed. Throw away any unused medicine after the expiration date. NOTE: This sheet is a summary. It may not cover all possible information. If you have questions about this medicine, talk to your doctor, pharmacist, or health care provider.    2016, Elsevier/Gold Standard. (2011-07-24 10:57:14)  

## 2015-12-21 NOTE — Care Management Note (Signed)
Case Management Note  Patient Details  Name: Timothy [name] MRN: [id number] Date of Birth: 1946/03/30  Subjective/Objective: Noted PT cons placed. D/c order.Await recc.                   Action/Plan:d/c home.   Expected Discharge Date:   (unknown)               Expected Discharge Plan:  Home/Self Care  In-House Referral:     Discharge planning Services  CM Consult  Post Acute Care Choice:    Choice offered to:     DME Arranged:    DME Agency:     HH Arranged:    HH Agency:     Status of Service:  In process, will continue to follow  Medicare Important Message Given:    Date Medicare IM Given:    Medicare IM give by:    Date Additional Medicare IM Given:    Additional Medicare Important Message give by:     If discussed at [place] of Stay Meetings, dates discussed:    Additional Comments:  Dessa Phi, RN 12/21/2015, 11:18 AM

## 2015-12-21 NOTE — Progress Notes (Addendum)
[facility] Progress Note   Subjective: weak but no new complaints Creat down 2.70  Filed Vitals:   12/20/15 1500 12/20/15 1530 12/20/15 2016 12/21/15 0542  BP: 106/85  123/76 139/79  Pulse: 72  73 70  Temp:  97.5 F (36.4 C) 97.8 F (36.6 C) 97.4 F (36.3 C)  TempSrc:  Oral Oral Oral  Resp: 14  20 20   Height:      Weight:    98.9 kg (218 lb 0.6 oz)  SpO2: 95%  94% 96%    Inpatient medications: . sodium chloride   Intravenous Once  . antiseptic oral rinse  7 mL Mouth Rinse BID  . calcitRIOL  0.25 mcg Oral Daily  . calcium gluconate  1 g Intravenous BID  . cholestyramine  4 g Oral BID  . diltiazem  240 mg Oral Daily  . feeding supplement (GLUCERNA SHAKE)  237 mL Oral BID BM  . feeding supplement (NEPRO CARB STEADY)  237 mL Oral Q24H  . fluconazole  100 mg Oral Daily  . insulin aspart  0-20 Units Subcutaneous TID WC  . insulin aspart  0-5 Units Subcutaneous QHS  . insulin glargine  50 Units Subcutaneous Daily  . labetalol  300 mg Oral BID  . loratadine  10 mg Oral Daily  . pantoprazole  40 mg Oral Daily  . potassium chloride  40 mEq Oral Once  . predniSONE  50 mg Oral BID WC  . sertraline  50 mg Oral q morning - 10a  . sodium bicarbonate  1,300 mg Oral BID  . sodium chloride  10-40 mL Intracatheter Q12H  . valGANciclovir  450 mg Oral Q48H     HYDROcodone-acetaminophen, hydrocortisone, LORazepam, menthol-cetylpyridinium, morphine injection, ondansetron (ZOFRAN) IV, prochlorperazine, sodium chloride, technetium TC 68M diethylenetriame-pentaacetic acid  Exam: Alert, calm, looks better, no distress No jvd Chest left base rales, R clear RRR  Abd obese, distended poss ascites 2+ LE edema bilat Neuro Ox3   UA 12/16 neg protein, no rbc/wbc  Assessment: 1 AKI/ CKD 4 - from tacrolimus toxicity, improving significantly 2 CKD stage IV creat 2.5 Nov, normal UA 3 Pulm infiltrates/ GVHD - per pulm this was suspected GHVD of lungs, responded to high-dose  steroids. CXR now normal. On pred 50 bid, off tac as above 4 BMT 2013 5 HTN labetalol / dilt 6 Hypocalcemia added vit D 7 Afib on po dilt 8 Volume has LE edema, stable wts, off lasix for now  Plan - check labs in am   Kelly Splinter MD [place] pager [phone]    cell [phone] 12/21/2015, 8:04 AM    Recent Labs Lab 12/19/15 0400 12/20/15 0425 12/20/15 0830 12/21/15 0513  NA 146*  --  145 145  K 3.4*  --  3.5 3.3*  CL 106  --  105 106  CO2 25  --  24 27  GLUCOSE 241*  --  203* 200*  BUN 136*  --  128* 120*  CREATININE 3.22*  --  3.10* 2.70*  CALCIUM 6.6*  --  6.7* 7.0*  PHOS 4.7* 4.1 4.1 3.7  3.6    Recent Labs Lab 12/16/15 0105 12/17/15 0350 12/18/15 0453 12/20/15 0830 12/21/15 0513  AST 37 42* 48*  --   --   ALT 25 32 35  --   --   ALKPHOS 89 87 91  --   --   BILITOT 0.6 1.3* 1.3*  --   --   PROT 5.5* 5.5* 5.1*  --   --  ALBUMIN 2.4* 2.6* 2.5* 2.4* 2.4*    Recent Labs Lab 12/18/15 0453 12/19/15 0400 12/20/15 0425  WBC 4.4 4.6 5.3  HGB 11.1* 11.4* 11.6*  HCT 34.3* 35.9* 36.6*  MCV 90.5 90.9 91.0  PLT 115* 117* 126*

## 2015-12-21 NOTE — Progress Notes (Signed)
Thank you for consult on Mr. Hosten. He has BC/BS and will require PT/OT evaluations to justify rehab needs. Will await evaluations to help determine most appropriate rehab venue.

## 2015-12-21 NOTE — Discharge Summary (Signed)
Physician Discharge Summary  Timothy [name] [id number] DOB: [birth year] DOA: 12/06/2015  PCP: Marton Redwood, MD  Admit date: 12/06/2015 Discharge date: 12/21/2015  Recommendations for Outpatient Follow-up:  Continue prednisone 50 mg twice a day through 12/27/2015. Follow up with pulmonary prior to finishing steroids and they plan to do follow up CT scan at which time they will decide on prednisone taper. Continue Valcyte through 12/25/2015.  Discharge Diagnoses:  Active Problems:   Shortness of breath   PNA (pneumonia)   Pulmonary infiltrate   Acute respiratory failure (HCC)   Acute on chronic renal failure (HCC)   Essential hypertension   Acute renal failure superimposed on stage 4 chronic kidney disease (HCC)   Uncontrolled type 2 diabetes mellitus with diabetic nephropathy, with long-term current use of insulin (HCC)   Acute on chronic graft-versus-host disease ([place])    Discharge Condition: stable   Diet recommendation: as tolerated   History of present illness:  69 year old male with past medical history of CLL s/p stem cell transplant, 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  Pt was under PCCM care and then transitioned to [facility] 12/19/2015. In short, initial hypoxia and shortness of breath thought to be due to GVHD. Pt is s/p bronch with BAL brushing and transbronchial biopsies 12/19. He was treated with abx for 7 days for strep bovis culture in the lung. He is being followed by his oncologist Dr. Jana Hakim, infectious disease and nephrology. Patient's creawtinine was as high as 3.7 on the admission (and his baseline around 2.6 in 09/2015) and this acute on chronic renal failure thought to be due to prograf which has been on hold since. He continues to have diarrhea but his C.diff was negative 12/08/2015. Additionally, CMV 12/19 showed CMV IgG positive but IgM negative. He  is on valaganciclovir which he needs to continue through 12/25/2015.  Hospital Course:    Assessment/Plan:    Acute respiratory failure with hypoxemia - Likely due to GVHD - Now resolved - Continuing incentive spirometry while awake  Graft-versus-host disease of the lungs - Improving chest x-ray imaging and oxygen requirement.  - Continue prednisone 50 mg BID through 01/05 and then needs repeat CT scan at which time pulmonary to decide on prednisone taper  HCAP secondary to strep bovis - Found on bronchoscopy and has completed appropriate course of abx for this  Diarrhea - C.diff was negative - Diarrhea improved since admission  CMV IgG - Continue Valcytel per ID recommendations through 12/25/2015  Acute renal failrure superimposed on CKD stage 4 / hypocalcemia - Baseline Cr in 09/2015 was 2.6 and on this admission 3.7 - Likely from prograf. Prograf given toxic level (12/26 24.4) and has been stopped. -Nephrology has seen the patient in consultation - Continue sodium bicarbonate and Calcitrol on discharge   CLL  - Post Transplant - Continuing prednisone 50 mg by mouth twice a day as mentioned above   Depression - Continue Zoloft  - Stable. Patient is not depressed  Uncontrolled diabetes mellitus with diabetic nephropathy with long term insulin use - Continue insulin regimen per home dose as per prior to this admission   Essential hypertension - Continue labetalol 300 mg twice daily and Cardizem 240 mg daily   Atrial fibrillation - CHADS vasc score at least 3 - Rate controlled with Cardizem  - Not on AC due to thrombocytopenia  Anemia of chronic disease / Thrombocytopenia - Due  to history of CLL - Hemoglobin is 11.6 and platelet count is 126, overall stable   DVT Prophylaxis  - SCD's   Code Status: Full.  Family Communication: plan of care discussed with the patient   IV access:  Peripheral IV  Procedures and diagnostic studies:   CT Chest  12/15: bilateral airspace & ground glass opacities, atherosclerosis in coronary arteries, cardiomegaly, small R pleural effusion, non-specific gallbladder distention, trace perihepatic ascites   TTE 12/15: LV with mod to severe LVH, mild reduction of systolic fxn, EF [id number], No RWMA, grade 1 diastolic dysfunction, mild mitral regurg, mild aortic regurg  VQ 12/16: LOW prob PE  Dg Chest Port 1 View 12/17/2015 There is poor inspiration with mild basilar atelectasis. No segmental infiltrate. Right IJ Port-A-Cath is   Dg Chest Port 1 View 12/16/2015 1. Stable to improved appearance of pulmonary edema pattern.   Medical Consultants:  Oncology PCCM  IAnti-Infectives:   Valcyte 12/27>>> 01/03 Diflucan 12/15>>>  Ganciclovir 12/21 - 12/27 Levaquin 12/20 - 12/26 Bactrim 12/17 - 12/20 Acyclovir 12/15 - 12/21  Signed:  Leisa Lenz, MD  [name] 12/21/2015, 10:24 AM  Pager #: [phone]  Time spent in minutes: more than 30 minutes    Discharge Exam: Filed Vitals:   12/20/15 2016 12/21/15 0542  BP: 123/76 139/79  Pulse: 73 70  Temp: 97.8 F (36.6 C) 97.4 F (36.3 C)  Resp: 20 20   Filed Vitals:   12/20/15 1500 12/20/15 1530 12/20/15 2016 12/21/15 0542  BP: 106/85  123/76 139/79  Pulse: 72  73 70  Temp:  97.5 F (36.4 C) 97.8 F (36.6 C) 97.4 F (36.3 C)  TempSrc:  Oral Oral Oral  Resp: 14  20 20   Height:      Weight:    218 lb 0.6 oz (98.9 kg)  SpO2: 95%  94% 96%    General: Pt is alert, follows commands appropriately, not in acute distress Cardiovascular: Regular rate and rhythm, S1/S2 +, no murmurs Respiratory: Clear to auscultation bilaterally, no wheezing, no crackles, no rhonchi Abdominal: Soft, non tender, non distended, bowel sounds +, no guarding Extremities: no edema, no cyanosis, pulses palpable bilaterally DP and PT Neuro: Grossly nonfocal  Discharge Instructions  Discharge Instructions    Call MD for:  difficulty breathing,  headache or visual disturbances    Complete by:  As directed      Call MD for:  persistant dizziness or light-headedness    Complete by:  As directed      Call MD for:  persistant nausea and vomiting    Complete by:  As directed      Call MD for:  severe uncontrolled pain    Complete by:  As directed      Diet - low sodium heart healthy    Complete by:  As directed      Discharge instructions    Complete by:  As directed   Continue prednisone 50 mg twice a day through 12/27/2015. Follow up with pulmonary prior to finishing steroids and they plan to do follow up CT scan at which time they will decide on prednisone taper. Continue Valcyte through 12/25/2015.     Increase activity slowly    Complete by:  As directed             Medication List    STOP taking these medications        acyclovir 400 MG tablet  Commonly known as:  ZOVIRAX  budesonide 3 MG 24 hr capsule  Commonly known as:  ENTOCORT EC     furosemide 40 MG tablet  Commonly known as:  LASIX     pseudoephedrine 60 MG tablet  Commonly known as:  SUDAFED     sulfamethoxazole-trimethoprim 800-160 MG tablet  Commonly known as:  BACTRIM DS,SEPTRA DS     tacrolimus 0.5 MG capsule  Commonly known as:  PROGRAF      TAKE these medications        calcitRIOL 0.25 MCG capsule  Commonly known as:  ROCALTROL  Take 1 capsule (0.25 mcg total) by mouth daily.     cholestyramine 4 g packet  Commonly known as:  QUESTRAN  Take 1 packet (4 g total) by mouth 2 (two) times daily.     diclofenac sodium 1 % Gel  Commonly known as:  VOLTAREN  Apply 2 g topically 2 (two) times daily.     diltiazem 240 MG 24 hr capsule  Commonly known as:  CARDIZEM CD  Take 1 capsule (240 mg total) by mouth daily.     feeding supplement (GLUCERNA SHAKE) Liqd  Take 237 mLs by mouth 2 (two) times daily between meals.     feeding supplement (NEPRO CARB STEADY) Liqd  Take 237 mLs by mouth daily.     fluconazole 100 MG tablet  Commonly  known as:  DIFLUCAN  TAKE 1 TABLET BY MOUTH ONCE DAILY     hydrocortisone 2.5 % rectal cream  Commonly known as:  ANUSOL-HC  Place rectally 3 (three) times daily as needed for hemorrhoids or itching.     insulin aspart 100 unit/mL injection  Commonly known as:  novoLOG  Inject 10-20 Units into the skin 3 (three) times daily before meals. Sliding scale     Insulin Pen Needle 31G X 5 MM Misc  Commonly known as:  B-D UF III MINI PEN NEEDLES  Use five daily with insulin as directed.     labetalol 300 MG tablet  Commonly known as:  NORMODYNE  Take 1 tablet (300 mg total) by mouth 2 (two) times daily.     loratadine 10 MG tablet  Commonly known as:  CLARITIN  Take 10 mg by mouth daily as needed for allergies.     omeprazole 20 MG capsule  Commonly known as:  PRILOSEC  Take 1 capsule (20 mg total) by mouth daily.     ondansetron 8 MG disintegrating tablet  Commonly known as:  ZOFRAN-ODT  Take 1 tablet (8 mg total) by mouth every 8 (eight) hours as needed for nausea or vomiting.     ONE TOUCH ULTRA TEST test strip  Generic drug:  glucose blood  TEST BEFORE MEALS AND AT BEDTIME.     ONETOUCH DELICA LANCETS [id number] Misc  USE TO TEST BEFORE MEALS AND AT BEDTIME     predniSONE 50 MG tablet  Commonly known as:  DELTASONE  Take 1 tablet (50 mg total) by mouth 2 (two) times daily with a meal.     prochlorperazine 10 MG tablet  Commonly known as:  COMPAZINE  Take 1 tablet (10 mg total) by mouth every 6 (six) hours as needed for nausea or vomiting.     sertraline 50 MG tablet  Commonly known as:  ZOLOFT  Take 1 tablet (50 mg total) by mouth every morning.     sodium bicarbonate 650 MG tablet  Take 2 tablets (1,300 mg total) by mouth daily.     TRESIBA FLEXTOUCH 200 UNIT/ML  Sopn  Generic drug:  Insulin Degludec  Inject 50 Units as directed daily.     valGANciclovir 450 MG tablet  Commonly known as:  VALCYTE  Take 1 tablet (450 mg total) by mouth every other day.            Follow-up Information    Follow up with Marton Redwood, MD. Schedule an appointment as soon as possible for a visit in 1 week.   Specialty:  Internal Medicine   Why:  Follow up appt after recent hospitalization   Contact information:   [place] [address] [phone]        The results of significant diagnostics from this hospitalization (including imaging, microbiology, ancillary and laboratory) are listed below for reference.    Significant Diagnostic Studies: Dg Chest 2 View  12/11/2015  CLINICAL DATA:  Weakness and shortness of breath today. History of pneumonia and leukemia. EXAM: CHEST  2 VIEW COMPARISON:  12/10/2015 FINDINGS: Right IJ Port-A-Cath unchanged. Lungs are somewhat hypoinflated and demonstrate continued airspace opacification over the right lobe and posterior lower lobes with slight interval improvement. Stable hazy opacification over the left mid upper lung. Small amount of bilateral pleural fluid posteriorly. Stable cardiomegaly. Remainder the exam is unchanged. IMPRESSION: Hypoinflation with multifocal airspace process with slight interval improvement likely multifocal pneumonia. Small amount of posterior pleural fluid. Right IJ Port-A-Cath unchanged. Electronically Signed   By: Marin Olp M.D.   On: 12/11/2015 11:51   Dg Chest 2 View  12/10/2015  CLINICAL DATA:  Shortness of breath. History of chronic lymphocytic leukemia EXAM: CHEST  2 VIEW COMPARISON:  Study obtained earlier in the day and chest CT December 06, 2015 FINDINGS: There is extensive airspace consolidation in the right lower lobe. There is more patchy infiltrate throughout much of the left upper lobe and to a lesser extent right upper lobe, stable. Compared to earlier in the day, there is new consolidation lateral left base. Heart is upper normal in size with pulmonary vascular within normal limits. Port-A-Cath tip is in the superior vena cava. No adenopathy appreciable. Multiple  compression fractures in the thoracic spine appear stable. IMPRESSION: Multifocal pneumonia with new infiltrate in the lateral left base compared to earlier in the day. Other areas of infiltrate appears stable. No change in cardiac silhouette. Electronically Signed   By: Lowella Grip III M.D.   On: 12/10/2015 20:29   Dg Chest 2 View  12/08/2015  CLINICAL DATA:  69 year old male with pneumonia and shortness of breath. Recent bone marrow transplant. EXAM: CHEST  2 VIEW COMPARISON:  12/07/2015 and prior radiograph FINDINGS: The cardiomediastinal silhouette is unchanged. A right Port-A-Cath with tip overlying the superior cavoatrial junction again noted. Increasing right upper lung airspace disease/pneumonia is noted. Bilateral lower lung airspace disease is unchanged. No large pleural effusions or pneumothorax noted. IMPRESSION: Increasing right upper lung airspace disease/pneumonia. Unchanged bilateral lower lung airspace disease. Electronically Signed   By: Margarette Canada M.D.   On: 12/08/2015 11:39   Dg Chest 2 View  12/07/2015  CLINICAL DATA:  Shortness of breath, pre VQ scan. EXAM: CHEST  2 VIEW COMPARISON:  Chest CT 12/06/2015 FINDINGS: Focal consolidation noted in the right lung base concerning for pneumonia, this is similar to prior CT. Minimal left basilar density, also similar. Heart is mildly enlarged. No effusions. No acute bony abnormality. IMPRESSION: Stable appearance of the bilateral lower lobe airspace opacities, right greater than left concerning for pneumonia. Electronically Signed   By: Rolm Baptise  M.D.   On: 12/07/2015 12:25   Ct Chest Wo Contrast  12/06/2015  CLINICAL DATA:  Shortness of breath. Kidney failure. Hypocalcemia. Hypoglycemia. History of leukemia, status post bone marrow transplant in 2013. EXAM: CT CHEST WITHOUT CONTRAST TECHNIQUE: Multidetector CT imaging of the chest was performed following the standard protocol without IV contrast. COMPARISON:  02/19/2015 FINDINGS:  Mediastinum/Nodes: The right-sided Port-A-Cath which terminates at the superior caval/ atrial junction. Tortuous descending thoracic aorta. Mild cardiomegaly. Multivessel coronary artery atherosclerosis. No mediastinal or definite hilar adenopathy, given limitations of unenhanced CT. Lungs/Pleura: Tiny right-sided pleural effusion. Right worse than left bibasilar and dependent upper lobe airspace and ground-glass opacity with mild septal thickening. Upper abdomen: Trace perihepatic ascites. Normal imaged portions of the spleen, stomach, adrenal glands, kidneys. The gallbladder is incompletely imaged but may be distended. This is similar to on the prior exam. No calcified stone. Subtle edema in the porta hepatis, including on image 56/series 2. Musculoskeletal: Remote left-sided rib fractures. Osteopenia. Advanced thoracolumbar spondylosis. Mild superior endplate compression deformity at T4 is chronic. Interval healing of previously described T10 compression deformity. A moderate T12 compression deformity is not significantly changed. IMPRESSION: 1. Bilateral airspace and ground-glass opacities, favor infection. Given dependent distribution, aspiration could look similar. 2.  Atherosclerosis, including within the coronary arteries. 3. Cardiomegaly with small right pleural effusion. 4. Nonspecific gallbladder distension. Suggestion of edema in the porta hepatis. Correlate with upper abdominal symptoms. Consider correlation with pancreatic enzyme levels to exclude pancreatitis. Depending on symptomatology, abdominal ultrasound or CT may be informative. 5. Trace perihepatic ascites. Electronically Signed   By: Abigail Miyamoto M.D.   On: 12/06/2015 18:46   Nm Pulmonary Perf And Vent  12/07/2015  CLINICAL DATA:  Shortness of breath for 1 and half weeks. Lower extremity swelling. EXAM: NUCLEAR MEDICINE VENTILATION - PERFUSION LUNG SCAN TECHNIQUE: Ventilation images were obtained in multiple projections using inhaled  aerosol Tc-55m DTPA. Perfusion images were obtained in multiple projections after intravenous injection of Tc-21m MAA. RADIOPHARMACEUTICALS:  31 millicurie [id number] DTPA aerosol inhalation and 0000000 millicurie [id number] MAA IV COMPARISON:  12/07/2015 FINDINGS: Ventilation: No focal ventilation defect. Central scratch set deposition of the radiopharmaceutical within the central airway is noted. Perfusion: No wedge shaped peripheral perfusion defects to suggest acute pulmonary embolism. IMPRESSION: Low probability for acute pulmonary embolus. Electronically Signed   By: Kerby Moors M.D.   On: 12/07/2015 15:38   US Renal Port  12/06/2015  CLINICAL DATA:  Acute renal failure EXAM: RENAL / URINARY TRACT ULTRASOUND COMPLETE COMPARISON:  02/19/2015 FINDINGS: Right Kidney: Length: 11.6 cm. Echogenicity within normal limits. No mass or hydronephrosis visualized. Left Kidney: Length: 10.9 cm. Echogenicity within normal limits. No mass or hydronephrosis visualized. Bladder: Limit assessment.  Under distended.  Grossly unremarkable. IMPRESSION: No acute finding by renal ultrasound. Electronically Signed   By: Jerilynn Mages.  Shick M.D.   On: 12/06/2015 21:13   Dg Chest Port 1 View  12/17/2015  CLINICAL DATA:  Acute respiratory failure EXAM: PORTABLE CHEST 1 VIEW COMPARISON:  12/16/2015 and 12/ 23/16 FINDINGS: Cardiomediastinal silhouette is stable. There is poor inspiration with mild basilar atelectasis. No segmental infiltrate. Right IJ Port-A-Cath is unchanged in position. Residual streaky mild edema or pneumonitis in lingula. Minimal central vascular congestion without global pulmonary edema IMPRESSION: There is poor inspiration with mild basilar atelectasis. No segmental infiltrate. Right IJ Port-A-Cath is unchanged in position. Residual streaky mild edema or pneumonitis in lingula. Electronically Signed   By: Lahoma Crocker M.D.   On: 12/17/2015 09:47  Dg Chest Port 1 View  12/16/2015  CLINICAL DATA:  Acute  respiratory failure EXAM: PORTABLE CHEST 1 VIEW COMPARISON:  12/14/2015 FINDINGS: Right chest wall port a catheter is noted with tip in the cavoatrial junction. Moderate cardiac enlargement. Decreased lung volumes. Pulmonary edema pattern is stable to improved in the interval. IMPRESSION: 1. Stable to improved appearance of pulmonary edema pattern. Electronically Signed   By: Kerby Moors M.D.   On: 12/16/2015 09:22   Dg Chest Port 1 View  12/14/2015  CLINICAL DATA:  Respiratory failure. EXAM: PORTABLE CHEST - 1 VIEW COMPARISON:  One-view chest x-ray 12/13/2015. FINDINGS: The heart size is normal. The lung volumes remain low. A diffuse interstitial pattern is slightly increased compared to the prior study. Bibasilar airspace disease is evident. A right IJ Port-A-Cath is stable. IMPRESSION: Slight increase in diffuse interstitial and airspace pattern compatible with edema and pneumonia. Electronically Signed   By: San Morelle M.D.   On: 12/14/2015 07:31   Dg Chest Port 1 View  12/13/2015  CLINICAL DATA:  69 year old male with shortness of breath, respiratory failure, pneumonia. Initial encounter. EXAM: PORTABLE CHEST 1 VIEW COMPARISON:  12/12/2015 and earlier. FINDINGS: Portable AP semi upright view at 0440 hours. Stable right chest porta cath. Stable lung volumes. Stable cardiac size and mediastinal contours. No pneumothorax or pleural effusion. Coarse and patchy bilateral perihilar opacity is stable since yesterday, mildly regressed in the right lower lung since 12/11/2015. No areas of worsening ventilation. IMPRESSION: Bilateral multifocal pneumonia stable since yesterday. Right lower lung ventilation mildly improved since 12/11/2015. Electronically Signed   By: Genevie Ann M.D.   On: 12/13/2015 07:15   Dg Chest Port 1 View  12/12/2015  CLINICAL DATA:  Shortness of breath. Weakness. History of pneumonia and the [place]. EXAM: PORTABLE CHEST 1 VIEW COMPARISON:  12/11/2015 and previous FINDINGS:  Pulmonary infiltrate in the right lower lobe is improving. However, there is worsened interstitial density throughout the lungs in general, particularly the upper lobes. This could go along with developing edema or spreading of the pneumonia. No visible effusion. Port-A-Cath on the right is unchanged. IMPRESSION: Continued improvement of the right lower lobe infiltrate. Worsening of generalized interstitial lung density, upper lobe predominant. Differential diagnosis is developing interstitial edema/ fluid overload versus generalized worsening of pneumonia. Electronically Signed   By: Nelson Chimes M.D.   On: 12/12/2015 08:20   Dg Chest Port 1 View  12/10/2015  CLINICAL DATA:  69 year old male post bronchoscopy right middle lobe. Subsequent encounter. EXAM: PORTABLE CHEST 1 VIEW COMPARISON:  12/08/2015. FINDINGS: Curvilinear structure right lung apex probably pleural reflection associated with rib rather than tiny pneumothorax. Attention to this on follow up. Asymmetric airspace disease greater on the right. When compared to the most recent chest x-ray, decrease in degree of consolidation right lung apex and slight increase in left perihilar consolidation. Pulmonary vascular congestion. Right MediPort catheter tip mid superior vena cava level. Heart size top-normal to slightly enlarged. IMPRESSION: Curvilinear structure right lung apex probably pleural reflection associated with rib rather than tiny pneumothorax. Attention to this on follow up. Asymmetric airspace disease greater on the right. When compared to the most recent chest x-ray, decrease in degree of consolidation right lung apex and slight increase in left perihilar consolidation. Pulmonary vascular congestion. Electronically Signed   By: Genia Del M.D.   On: 12/10/2015 10:26   Dg C-arm Bronchoscopy  12/10/2015  CLINICAL DATA:  C-ARM BRONCHOSCOPY Fluoroscopy was utilized by the requesting physician.  No radiographic interpretation.  Microbiology: Recent Results (from the past 240 hour(s))  MRSA PCR Screening     Status: None   Collection Time: 12/12/15  9:46 AM  Result Value Ref Range Status   MRSA by PCR NEGATIVE NEGATIVE Final    Comment:        The GeneXpert MRSA Assay (FDA approved for NASAL specimens only), is one component of a comprehensive MRSA colonization surveillance program. It is not intended to diagnose MRSA infection nor to guide or monitor treatment for MRSA infections.      Labs: Basic Metabolic Panel:  Recent Labs Lab 12/17/15 0350 12/18/15 0453 12/19/15 0400 12/20/15 0425 12/20/15 0830 12/21/15 0513  NA 144 143 146*  --  145 145  K 3.0* 3.6 3.4*  --  3.5 3.3*  CL 102 104 106  --  105 106  CO2 25 24 25   --  24 27  GLUCOSE 267* 195* 241*  --  203* 200*  BUN 136* 146* 136*  --  128* 120*  CREATININE 3.47* 3.53* 3.22*  --  3.10* 2.70*  CALCIUM 6.3* 6.4* 6.6*  --  6.7* 7.0*  MG 1.6* 2.1 1.9 1.9  --  2.0  PHOS 5.3* 5.1* 4.7* 4.1 4.1 3.7  3.6   Liver Function Tests:  Recent Labs Lab 12/15/15 0545 12/16/15 0105 12/17/15 0350 12/18/15 0453 12/20/15 0830 12/21/15 0513  AST 33 37 42* 48*  --   --   ALT 23 25 32 35  --   --   ALKPHOS 90 89 87 91  --   --   BILITOT 0.3 0.6 1.3* 1.3*  --   --   PROT 5.4* 5.5* 5.5* 5.1*  --   --   ALBUMIN 2.5* 2.4* 2.6* 2.5* 2.4* 2.4*   No results for input(s): LIPASE, AMYLASE in the last 168 hours. No results for input(s): AMMONIA in the last 168 hours. CBC:  Recent Labs Lab 12/16/15 0105 12/17/15 0350 12/18/15 0453 12/19/15 0400 12/20/15 0425  WBC 3.5* 3.4* 4.4 4.6 5.3  HGB 10.1* 10.6* 11.1* 11.4* 11.6*  HCT 30.6* 32.8* 34.3* 35.9* 36.6*  MCV 89.2 89.9 90.5 90.9 91.0  PLT 94* 104* 115* 117* 126*   Cardiac Enzymes:  Recent Labs Lab 12/15/15 1255 12/15/15 1855 12/16/15 0105  TROPONINI 0.41* 0.52* 0.50*   BNP: BNP (last 3 results)  Recent Labs  03/07/15 1053  BNP 358.3*    ProBNP (last 3 results) No results  for input(s): PROBNP in the last 8760 hours.  CBG:  Recent Labs Lab 12/20/15 0739 12/20/15 1150 12/20/15 1708 12/20/15 2131 12/21/15 0747  GLUCAP 204* 215* 154* 151* 199*

## 2015-12-21 NOTE — Telephone Encounter (Signed)
This RN contacted Timothy [name] in [facility]- long term follow up team at [phone].  Spoke with Firefighter.  Reviewed pt's current status per inpt and currently off of tacrolimus due to ongoing and worsening kidney function.  Request is for advice regarding need for immunosuppressive medication for post transplant concerns.  [facility] requested most recent records to be sent.  They will review and contact MD next week with advice.  This RN obtained fax number for above as [phone].  This RN name and direct desk number given and MD cell number for contact if needed.  Records faxed per above.

## 2015-12-21 NOTE — Care Management Note (Signed)
Case Management Note  Patient Details  Name: Timothy [name] MRN: [id number] Date of Birth: March 31, 1946  Subjective/Objective:  Noted IP rehab cons.                  Action/Plan:   Expected Discharge Date:   (unknown)               Expected Discharge Plan:  Home/Self Care  In-House Referral:     Discharge planning Services  CM Consult  Post Acute Care Choice:    Choice offered to:     DME Arranged:    DME Agency:     HH Arranged:    HH Agency:     Status of Service:  In process, will continue to follow  Medicare Important Message Given:    Date Medicare IM Given:    Medicare IM give by:    Date Additional Medicare IM Given:    Additional Medicare Important Message give by:     If discussed at [place] of Stay Meetings, dates discussed:    Additional Comments:  Dessa Phi, RN 12/21/2015, 1:24 PM

## 2015-12-22 DIAGNOSIS — R894 Abnormal immunological findings in specimens from other organs, systems and tissues: Secondary | ICD-10-CM

## 2015-12-22 DIAGNOSIS — T8601 Bone marrow transplant rejection: Secondary | ICD-10-CM

## 2015-12-22 DIAGNOSIS — E1165 Type 2 diabetes mellitus with hyperglycemia: Secondary | ICD-10-CM

## 2015-12-22 DIAGNOSIS — R531 Weakness: Secondary | ICD-10-CM

## 2015-12-22 DIAGNOSIS — Z794 Long term (current) use of insulin: Secondary | ICD-10-CM

## 2015-12-22 DIAGNOSIS — E1121 Type 2 diabetes mellitus with diabetic nephropathy: Secondary | ICD-10-CM

## 2015-12-22 DIAGNOSIS — Z9489 Other transplanted organ and tissue status: Secondary | ICD-10-CM

## 2015-12-22 DIAGNOSIS — R5381 Other malaise: Secondary | ICD-10-CM

## 2015-12-22 LAB — GLUCOSE, CAPILLARY
GLUCOSE-CAPILLARY: 184 mg/dL — AB (ref 65–99)
GLUCOSE-CAPILLARY: 224 mg/dL — AB (ref 65–99)
GLUCOSE-CAPILLARY: 191 mg/dL — AB (ref 65–99)

## 2015-12-22 LAB — RENAL FUNCTION PANEL
ALBUMIN: 2.4 g/dL — AB (ref 3.5–5.0)
ANION GAP: 10 (ref 5–15)
BUN: 105 mg/dL — ABNORMAL HIGH (ref 6–20)
CHLORIDE: 109 mmol/L (ref 101–111)
CO2: 26 mmol/L (ref 22–32)
Calcium: 7.3 mg/dL — ABNORMAL LOW (ref 8.9–10.3)
Creatinine, Ser: 2.39 mg/dL — ABNORMAL HIGH (ref 0.61–1.24)
GFR calc Af Amer: 30 mL/min — ABNORMAL LOW (ref >60)
GFR calc non Af Amer: 26 mL/min — ABNORMAL LOW (ref >60)
GLUCOSE: 210 mg/dL — AB (ref 65–99)
PHOSPHORUS: 3.1 mg/dL (ref 2.5–4.6)
POTASSIUM: 3.8 mmol/L (ref 3.5–5.1)
Sodium: 145 mmol/L (ref 135–145)

## 2015-12-22 LAB — PHOSPHORUS: Phosphorus: 3 mg/dL (ref 2.5–4.6)

## 2015-12-22 LAB — TACROLIMUS LEVEL: Tacrolimus (FK506) - LabCorp: 10.4 ng/mL (ref 2.0–20.0)

## 2015-12-22 LAB — MAGNESIUM: Magnesium: 1.9 mg/dL (ref 1.7–2.4)

## 2015-12-22 MED ORDER — VALGANCICLOVIR HCL 450 MG PO TABS
450.0000 mg | ORAL_TABLET | ORAL | Status: AC
  Administered 2015-12-23 – 2015-12-25 (×3): 450 mg via ORAL
  Filled 2015-12-22 (×3): qty 1

## 2015-12-22 MED ORDER — FUROSEMIDE 40 MG PO TABS
80.0000 mg | ORAL_TABLET | Freq: Two times a day (BID) | ORAL | Status: DC
  Administered 2015-12-22 – 2015-12-28 (×12): 80 mg via ORAL
  Filled 2015-12-22 (×13): qty 2

## 2015-12-22 NOTE — Evaluation (Signed)
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  Clinical Impression  On eval, pt required Max-Total assist +2 for mobility-sat EOB for a total of at least 8 minutes with varying level of assist. LOB posteriorly x 1. Unable to sit unsupported. Attempted stand with STEDY from highly elevated surface-pt unable due to significant UE and LE weakness. Do not feel pt can tolerate CIR at this time. Recommend SNF for continued rehab.     Follow Up Recommendations SNF    Equipment Recommendations  None recommended by PT    Recommendations for Other Services OT consult     Precautions / Restrictions Precautions Precautions: Fall Restrictions Weight Bearing Restrictions: No      Mobility  Bed Mobility Overal bed mobility: Needs Assistance Bed Mobility: Supine to Sit     Supine to sit: HOB elevated;Max assist;+2 for physical assistance;+2 for safety/equipment     General bed mobility comments: Assist for trunk and bil LEs. Utilized bedpad for scooting, positioning. Increased time. Pt fatigues easily/quickly. Sat EOB at least 5 minutes before able to attempt stand  Transfers Overall transfer level: Needs assistance   Transfers: Sit to/from Stand Sit to Stand: From elevated surface;Total assist;+2 physical assistance;+2 safety/equipment         General transfer comment: Assist to rise, stabilize, control descent. Only able to clear bottom from bed surface. Bil knee buckling. Poor UE strength to aide with standing.   Ambulation/Gait             General Gait Details: NT-pt  unable  Stairs            Wheelchair Mobility    Modified Rankin (Stroke Patients Only)       Balance Overall balance assessment: Needs assistance Sitting-balance support: Bilateral upper extremity supported;Feet supported Sitting balance-Leahy Scale: Poor Sitting balance - Comments: LOB posteriorly x1. Unable to sit unsupported                                     Pertinent Vitals/Pain Pain Assessment: No/denies pain    Home Living Family/patient expects to be discharged to:: Skilled nursing facility Living Arrangements: Spouse/significant other   Type of Home: House                Prior Function Level of Independence: Independent with assistive device(s)               Hand Dominance        Extremity/Trunk Assessment   Upper Extremity Assessment: Generalized weakness           Lower Extremity Assessment: RLE deficits/detail;LLE deficits/detail RLE Deficits / Details: hip ~2/5, knee ~2+/5, DF/PF ~2/5 LLE Deficits / Details: hip ~2/5, knee ~2+/5, DF/PF ~2/5  Cervical / Trunk Assessment: Kyphotic  Communication   Communication: No difficulties  Cognition Arousal/Alertness: Awake/alert Behavior During Therapy: WFL for tasks assessed/performed Overall Cognitive Status: Within Functional Limits for tasks assessed                      General Comments  Exercises        Assessment/Plan    PT Assessment Patient needs continued PT services  PT Diagnosis Generalized weakness;Difficulty walking   PT Problem List Decreased strength;Decreased activity tolerance;Decreased balance;Decreased mobility;Decreased coordination;Decreased knowledge of use of DME  PT Treatment Interventions Functional mobility training;Therapeutic activities;Patient/family education;Balance training;Therapeutic exercise;DME instruction   PT Goals (Current goals can be found in the Care Plan section) Acute Rehab PT Goals Patient Stated Goal:  to get stronger at rehab PT Goal Formulation: With patient Time For Goal Achievement: 01/05/16 Potential to Achieve Goals: Good    Frequency Min 3X/week   Barriers to discharge        Co-evaluation               End of Session Equipment Utilized During Treatment: Gait belt Activity Tolerance: Patient limited by fatigue Patient left: in bed;with call bell/phone within reach (on bedpan for BM-Made RN and NT aware)           Time: [id number] PT Time Calculation (min) (ACUTE ONLY): 18 min   Charges:   PT Evaluation $Initial PT Evaluation Tier I: 1 Procedure     PT G Codes:        Weston Anna, MPT Pager: [phone]

## 2015-12-22 NOTE — Progress Notes (Signed)
COURTESY NOTE:  Greatly appreciate hospiralist's and consultants help to this patient!  Timothy [name] I believe has turned the corner. Basically, now that we can look back on this admission, he appears to have had a minor infection w s bovis which kicked off a sever pneumonitis related to his GVHD. That responded well to high doses of steroids. He also had some evidence of chronic renal taxocity from tacrolimus (acidosis, hyperkalemia, increasing creatinine) and that is improving off that medication.  He remains very deconditioned and of course steroids will contribute to his muscle wasting. He will need intensive rehab. I don't think he would be able to do 3 hours/day (for inpatient rehab) so would suggest SNF, perhaps [facility] or similar, where he can have his CBGs closely monitored, work on improving his functional status, nutrition, and continuing to follow and correct his electrolytes including his persistent hypocalcemia and hypomagnesemia.  I will schedule a follow up appointment for [facility] with me within 2 weeks. I will also be consulting with the [facility] transplant team in terms of how to manage his very slow steroid taper..Will ask SW to work with ptient and wife Timothy [name] in terms in terms of guidance regarding SNF

## 2015-12-22 NOTE — Progress Notes (Signed)
Progress Note   Timothy [name] [id number] DOB: 1946-12-14 DOA: 12/06/2015 PCP: Marton Redwood, MD   Brief Narrative:   MAHAMUD FRENZ is an 69 y.o. 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 Pt was under PCCM care and then transitioned to [facility] 12/19/2015. In short, initial hypoxia and shortness of breath thought to be due to GVHD. Pt is s/p bronch with BAL brushing and transbronchial biopsies 12/10/15. He was treated with abx for 7 days for strep bovis culture in the lung. He is being followed by his oncologist Dr. Jana Hakim, infectious disease and nephrology. Patient's creatinine was as high as 3.7 on the admission (and his baseline around 2.6 in 09/2015) and this acute on chronic renal failure thought to be due to prograf which has been on hold since. He continues to have diarrhea but his C.diff was negative 12/08/2015. Additionally, CMV 12/10/15 showed CMV IgG positive but IgM negative. He is on valaganciclovir which he needs to continue through 12/25/2015.   Assessment/Plan:    Acute respiratory failure with hypoxemia/HCAP/S. Bovis lung infection in an immunocompromised host. - Likely due to S. Bovis in the setting of immunocompromise and GVHD.  S/P Bronchoscopy/BAL. - Now resolved. - Continuing incentive spirometry while awake.  Graft-versus-host disease of the lungs - Improving chest x-ray imaging and oxygen requirement.  - Continue prednisone 50 mg BID through 01/05 and then needs repeat CT scan at which time pulmonary to decide on prednisone taper.  Deconditioning/weakness - Evaluated by physical therapist 12/22/15. SNF recommended. - OT consult recommended. Will order.  Diarrhea - C.diff was negative. - Diarrhea improved since admission.  CMV IgG - Continue Valcytel per ID recommendations through  12/25/2015.  Acute renal failrure superimposed on CKD stage 4 / hypocalcemia - Baseline Cr in 09/2015 was 2.6 and on this admission 3.7. - Likely from prograf. Prograf given toxic level (12/26 24.4) and has been stopped. - Nephrology has seen the patient in consultation & recommends D/C of Prograf. - Continue sodium bicarbonate and Calcitrol on discharge.  CLL  - Post Transplant. - Continuing prednisone 50 mg by mouth twice a day as mentioned above.   Depression - Continue Zoloft.  - Stable.   Uncontrolled diabetes mellitus with diabetic nephropathy with long term insulin use - Currently being managed with Lantus 50 units daily and insulin resistant SSI. - CBGs 184-232.  Essential hypertension - Continue labetalol 300 mg twice daily and Cardizem 240 mg daily.   Atrial fibrillation - CHADS vasc score at least 3. - Rate controlled with Cardizem.  - Not on AC due to thrombocytopenia.  Anemia of chronic disease / Thrombocytopenia - Due to history of CLL. - Monitor counts.    DVT Prophylaxis - SCDs ordered.   Family Communication/Anticipated D/C date and plan/Code Status   Family Communication: Wife updated at bedside. Disposition Plan: SNF when bed available. Anticipated D/C date:   1-2 days. Code Status: Full code.   IV Access:    Peripheral IV   Procedures and diagnostic studies:   Dg Chest 2 View  12/07/2015  CLINICAL DATA:  Shortness of breath, pre VQ scan. EXAM: CHEST  2 VIEW COMPARISON:  Chest CT 12/06/2015 FINDINGS: Focal consolidation noted in the right lung base concerning for pneumonia, this is similar to prior CT. Minimal left basilar density, also similar. Heart is mildly enlarged.  No effusions. No acute bony abnormality. IMPRESSION: Stable appearance of the bilateral lower lobe airspace opacities, right greater than left concerning for pneumonia. Electronically Signed   By: Rolm Baptise M.D.   On: 12/07/2015 12:25   Ct Chest Wo Contrast  12/06/2015   CLINICAL DATA:  Shortness of breath. Kidney failure. Hypocalcemia. Hypoglycemia. History of leukemia, status post bone marrow transplant in 2013. EXAM: CT CHEST WITHOUT CONTRAST TECHNIQUE: Multidetector CT imaging of the chest was performed following the standard protocol without IV contrast. COMPARISON:  02/19/2015 FINDINGS: Mediastinum/Nodes: The right-sided Port-A-Cath which terminates at the superior caval/ atrial junction. Tortuous descending thoracic aorta. Mild cardiomegaly. Multivessel coronary artery atherosclerosis. No mediastinal or definite hilar adenopathy, given limitations of unenhanced CT. Lungs/Pleura: Tiny right-sided pleural effusion. Right worse than left bibasilar and dependent upper lobe airspace and ground-glass opacity with mild septal thickening. Upper abdomen: Trace perihepatic ascites. Normal imaged portions of the spleen, stomach, adrenal glands, kidneys. The gallbladder is incompletely imaged but may be distended. This is similar to on the prior exam. No calcified stone. Subtle edema in the porta hepatis, including on image 56/series 2. Musculoskeletal: Remote left-sided rib fractures. Osteopenia. Advanced thoracolumbar spondylosis. Mild superior endplate compression deformity at T4 is chronic. Interval healing of previously described T10 compression deformity. A moderate T12 compression deformity is not significantly changed. IMPRESSION: 1. Bilateral airspace and ground-glass opacities, favor infection. Given dependent distribution, aspiration could look similar. 2.  Atherosclerosis, including within the coronary arteries. 3. Cardiomegaly with small right pleural effusion. 4. Nonspecific gallbladder distension. Suggestion of edema in the porta hepatis. Correlate with upper abdominal symptoms. Consider correlation with pancreatic enzyme levels to exclude pancreatitis. Depending on symptomatology, abdominal ultrasound or CT may be informative. 5. Trace perihepatic ascites. Electronically  Signed   By: Abigail Miyamoto M.D.   On: 12/06/2015 18:46   Nm Pulmonary Perf And Vent  12/07/2015  CLINICAL DATA:  Shortness of breath for 1 and half weeks. Lower extremity swelling. EXAM: NUCLEAR MEDICINE VENTILATION - PERFUSION LUNG SCAN TECHNIQUE: Ventilation images were obtained in multiple projections using inhaled aerosol Tc-21m DTPA. Perfusion images were obtained in multiple projections after intravenous injection of Tc-62m MAA. RADIOPHARMACEUTICALS:  31 millicurie [id number] DTPA aerosol inhalation and 0000000 millicurie [id number] MAA IV COMPARISON:  12/07/2015 FINDINGS: Ventilation: No focal ventilation defect. Central scratch set deposition of the radiopharmaceutical within the central airway is noted. Perfusion: No wedge shaped peripheral perfusion defects to suggest acute pulmonary embolism. IMPRESSION: Low probability for acute pulmonary embolus. Electronically Signed   By: Kerby Moors M.D.   On: 12/07/2015 15:38   US Renal Port  12/06/2015  CLINICAL DATA:  Acute renal failure EXAM: RENAL / URINARY TRACT ULTRASOUND COMPLETE COMPARISON:  02/19/2015 FINDINGS: Right Kidney: Length: 11.6 cm. Echogenicity within normal limits. No mass or hydronephrosis visualized. Left Kidney: Length: 10.9 cm. Echogenicity within normal limits. No mass or hydronephrosis visualized. Bladder: Limit assessment.  Under distended.  Grossly unremarkable. IMPRESSION: No acute finding by renal ultrasound. Electronically Signed   By: Jerilynn Mages.  Shick M.D.   On: 12/06/2015 21:13     Medical Consultants:    Oncology: Dr. Jana Hakim  Nephrology: Dr. Melvia Heaps  PCCM  Anti-Infectives:   Anti-infectives    Start     Dose/Rate Route Frequency Ordered Stop   12/21/15 0000  valGANciclovir (VALCYTE) 450 MG tablet     450 mg Oral Every 48 hours 12/21/15 1024 11/23/16 2359   12/18/15 1500  valGANciclovir (VALCYTE) 450 MG tablet TABS 450 mg  450 mg Oral Every 48 hours 12/18/15 1201 12/26/15 1459   12/12/15 1600   ganciclovir (CYTOVENE) 125 mg in sodium chloride 0.9 % 100 mL IVPB  Status:  Discontinued    Comments:  Pharmacy may adjust   1.25 mg/kg  99.9 kg 100 mL/hr over 60 Minutes Intravenous Every 24 hours 12/12/15 1416 12/18/15 1201   12/11/15 1400  levofloxacin (LEVAQUIN) tablet 750 mg     750 mg Oral Every 48 hours 12/11/15 1319 12/17/15 1500   12/08/15 2000  sulfamethoxazole-trimethoprim (BACTRIM) 291.2 mg in dextrose 5 % 250 mL IVPB  Status:  Discontinued     7.5 mg/kg/day  77.5 kg (Adjusted) 268.2 mL/hr over 60 Minutes Intravenous Every 12 hours 12/08/15 1717 12/11/15 1108   12/06/15 1800  acyclovir (ZOVIRAX) tablet 800 mg  Status:  Discontinued     800 mg Oral 2 times daily 12/06/15 1346 12/12/15 1416   12/06/15 1600  sulfamethoxazole-trimethoprim (BACTRIM DS,SEPTRA DS) 800-160 MG per tablet 1 tablet  Status:  Discontinued     1 tablet Oral Daily 12/06/15 1346 12/06/15 1634   12/06/15 1600  fluconazole (DIFLUCAN) tablet 100 mg     100 mg Oral Daily 12/06/15 1346        Subjective:    AMIRALI CANFIELD feels weak and says he has not been out of bed since admission. Reports chronic loose stools, nothing different. No nausea or vomiting. Anxious about condition. Denies shortness of breath  Objective:    Filed Vitals:   12/21/15 2002 12/22/15 0457 12/22/15 0834 12/22/15 1341  BP: 125/76 138/74 134/80 129/82  Pulse: 74 71 77 77  Temp: 97.9 F (36.6 C) 97.9 F (36.6 C)  99.1 F (37.3 C)  TempSrc: Oral Oral  Oral  Resp: 20 18  18   Height:      Weight:  99.7 kg (219 lb 12.8 oz)    SpO2: 95% 93%  94%    Intake/Output Summary (Last 24 hours) at 12/22/15 1418 Last data filed at 12/22/15 1248  Gross per 24 hour  Intake    100 ml  Output    500 ml  Net   -400 ml   Filed Weights   12/20/15 0434 12/21/15 0542 12/22/15 0457  Weight: 97.1 kg (214 lb 1.1 oz) 98.9 kg (218 lb 0.6 oz) 99.7 kg (219 lb 12.8 oz)    Exam: Gen:  Anxious Cardiovascular:  RRR, No M/R/G Respiratory:  Lungs  CTAB Gastrointestinal:  Abdomen soft, NT/ND, + BS Extremities:  2+ edema   Data Reviewed:    Labs: Basic Metabolic Panel:  Recent Labs Lab 12/18/15 0453 12/19/15 0400 12/20/15 0425 12/20/15 0830 12/21/15 0513 12/22/15 0440  NA 143 146*  --  145 145 145  K 3.6 3.4*  --  3.5 3.3* 3.8  CL 104 106  --  105 106 109  CO2 24 25  --  24 27 26   GLUCOSE 195* 241*  --  203* 200* 210*  BUN 146* 136*  --  128* 120* 105*  CREATININE 3.53* 3.22*  --  3.10* 2.70* 2.39*  CALCIUM 6.4* 6.6*  --  6.7* 7.0* 7.3*  MG 2.1 1.9 1.9  --  2.0 1.9  PHOS 5.1* 4.7* 4.1 4.1 3.7  3.6 3.1  3.0   GFR Estimated Creatinine Clearance: 32.8 mL/min (by C-G formula based on Cr of 2.39). Liver Function Tests:  Recent Labs Lab 12/16/15 0105 12/17/15 0350 12/18/15 0453 12/20/15 0830 12/21/15 0513 12/22/15 0440  AST 37 42* 48*  --   --   --  ALT 25 32 35  --   --   --   ALKPHOS 89 87 91  --   --   --   BILITOT 0.6 1.3* 1.3*  --   --   --   PROT 5.5* 5.5* 5.1*  --   --   --   ALBUMIN 2.4* 2.6* 2.5* 2.4* 2.4* 2.4*   CBC:  Recent Labs Lab 12/16/15 0105 12/17/15 0350 12/18/15 0453 12/19/15 0400 12/20/15 0425  WBC 3.5* 3.4* 4.4 4.6 5.3  HGB 10.1* 10.6* 11.1* 11.4* 11.6*  HCT 30.6* 32.8* 34.3* 35.9* 36.6*  MCV 89.2 89.9 90.5 90.9 91.0  PLT 94* 104* 115* 117* 126*   Cardiac Enzymes:  Recent Labs Lab 12/15/15 1855 12/16/15 0105  TROPONINI 0.52* 0.50*   CBG:  Recent Labs Lab 12/21/15 1145 12/21/15 1740 12/21/15 2247 12/22/15 0814 12/22/15 1156  GLUCAP 194* 221* 232* 184* 224*   Microbiology No results found for this or any previous visit (from the past 240 hour(s)).   Medications:   . sodium chloride   Intravenous Once  . antiseptic oral rinse  7 mL Mouth Rinse BID  . calcitRIOL  0.25 mcg Oral Daily  . calcium gluconate  1 g Intravenous BID  . cholestyramine  4 g Oral BID  . diltiazem  240 mg Oral Daily  . feeding supplement (GLUCERNA SHAKE)  237 mL Oral BID BM  .  feeding supplement (NEPRO CARB STEADY)  237 mL Oral Q24H  . fluconazole  100 mg Oral Daily  . furosemide  80 mg Oral BID  . insulin aspart  0-20 Units Subcutaneous TID WC  . insulin aspart  0-5 Units Subcutaneous QHS  . insulin glargine  50 Units Subcutaneous Daily  . labetalol  300 mg Oral BID  . loratadine  10 mg Oral Daily  . pantoprazole  40 mg Oral Daily  . predniSONE  50 mg Oral BID WC  . sertraline  50 mg Oral q morning - 10a  . sodium bicarbonate  1,300 mg Oral BID  . sodium chloride  10-40 mL Intracatheter Q12H  . valGANciclovir  450 mg Oral Q48H   Continuous Infusions:   Time spent: 35 minutes with > 50% of time discussing current diagnostic test results, clinical impression and plan of care.   LOS: 16 days   [facility] Pager [phone]. If unable to reach me by pager, please call my cell phone at [phone].  *Please refer to [email or web address], password [email or web address] to get updated schedule on who will round on this patient, as hospitalists switch teams weekly. If 7PM-7AM, please contact night-coverage at [email or web address], password [email or web address] for any overnight needs.  12/22/2015, 2:18 PM

## 2015-12-22 NOTE — Progress Notes (Signed)
Pharmacy - Brief Note (Valganciclovir renal dose adjustment)  60 YOM on valganciclovir for possible CMV infection.  Today is day #11/14 treatment.  Previously on IV ganciclovir  Renal function has improved to 2.39 w/ est CrCl = 38ml/min  Plan:  Based on improved renal fx, change valganciclovir to 450mg  PO daily for complete 14-day course as outlined by ID  Doreene Eland, PharmD, BCPS.   Pager: [phone] 12/22/2015 3:06 PM

## 2015-12-22 NOTE — Progress Notes (Addendum)
[facility] Progress Note   Subjective: got up to chair w 2 assist.  No SOB or cough. Wt up slightly.   Filed Vitals:   12/21/15 1415 12/21/15 2002 12/22/15 0457 12/22/15 0834  BP: 135/81 125/76 138/74 134/80  Pulse: 73 74 71 77  Temp: 97.8 F (36.6 C) 97.9 F (36.6 C) 97.9 F (36.6 C)   TempSrc: Oral Oral Oral   Resp: 18 20 18    Height:      Weight:   99.7 kg (219 lb 12.8 oz)   SpO2: 93% 95% 93%     Inpatient medications: . sodium chloride   Intravenous Once  . antiseptic oral rinse  7 mL Mouth Rinse BID  . calcitRIOL  0.25 mcg Oral Daily  . calcium gluconate  1 g Intravenous BID  . cholestyramine  4 g Oral BID  . diltiazem  240 mg Oral Daily  . feeding supplement (GLUCERNA SHAKE)  237 mL Oral BID BM  . feeding supplement (NEPRO CARB STEADY)  237 mL Oral Q24H  . fluconazole  100 mg Oral Daily  . insulin aspart  0-20 Units Subcutaneous TID WC  . insulin aspart  0-5 Units Subcutaneous QHS  . insulin glargine  50 Units Subcutaneous Daily  . labetalol  300 mg Oral BID  . loratadine  10 mg Oral Daily  . pantoprazole  40 mg Oral Daily  . predniSONE  50 mg Oral BID WC  . sertraline  50 mg Oral q morning - 10a  . sodium bicarbonate  1,300 mg Oral BID  . sodium chloride  10-40 mL Intracatheter Q12H  . valGANciclovir  450 mg Oral Q48H     HYDROcodone-acetaminophen, hydrocortisone, LORazepam, menthol-cetylpyridinium, morphine injection, ondansetron (ZOFRAN) IV, prochlorperazine, sodium chloride, technetium TC 50M diethylenetriame-pentaacetic acid  Exam: Alert, calm, looks better, no distress No jvd Chest left base rales, R clear RRR  Abd obese, distended poss ascites Diffuse 2+ edema LE's/ flanks/ UE's Neuro Ox3   UA 12/16 neg protein, no rbc/wbc  Assessment: 1 AKI/ CKD 4 - from tac toxicity, improving significantly off of tacrolimus. Cr down 2.3. UA normal 2 Pulm infiltrates/ GVHD - per pulm was felt to have GHVD of lungs, responded to high-dose  steroids. CXR now normal. On pred 50 bid, off tac as above 3 Vol excess/ edema - diffuse, will start lasix today 4 BMT [id number] labetalol / dilt 5 Hypocalcemia added vit D 6 Afib on po dilt 7 CMV on valganciclovir for poss CMV infection d# 11/14 8 Debility - PT working w patient, very weak  Plan - po lasix 80 bid, creat in am   Kelly Splinter MD [place] pager [phone]    cell [phone] 12/22/2015, 9:57 AM    Recent Labs Lab 12/20/15 0830 12/21/15 0513 12/22/15 0440  NA 145 145 145  K 3.5 3.3* 3.8  CL 105 106 109  CO2 24 27 26   GLUCOSE 203* 200* 210*  BUN 128* 120* 105*  CREATININE 3.10* 2.70* 2.39*  CALCIUM 6.7* 7.0* 7.3*  PHOS 4.1 3.7  3.6 3.1  3.0    Recent Labs Lab 12/16/15 0105 12/17/15 0350 12/18/15 0453 12/20/15 0830 12/21/15 0513 12/22/15 0440  AST 37 42* 48*  --   --   --   ALT 25 32 35  --   --   --   ALKPHOS 89 87 91  --   --   --   BILITOT 0.6 1.3* 1.3*  --   --   --  PROT 5.5* 5.5* 5.1*  --   --   --   ALBUMIN 2.4* 2.6* 2.5* 2.4* 2.4* 2.4*    Recent Labs Lab 12/18/15 0453 12/19/15 0400 12/20/15 0425  WBC 4.4 4.6 5.3  HGB 11.1* 11.4* 11.6*  HCT 34.3* 35.9* 36.6*  MCV 90.5 90.9 91.0  PLT 115* 117* 126*

## 2015-12-22 NOTE — Evaluation (Signed)
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   Clinical Impression   Pt admitted with above. He demonstrates the below listed deficits and will benefit from continued OT to maximize safety and independence with BADLs.  Pt presents to OT with significant deconditioning.  Currently, he requires max - total A with ADLs except self feeding and grooming.   He fatigues quite rapidly with minimal activity.   Feel he will need SNF level rehab at discharge.       Follow Up Recommendations  SNF;Supervision/Assistance - 24 hour    Equipment Recommendations  None recommended by OT    Recommendations for Other Services       Precautions / Restrictions Precautions Precautions: Fall      Mobility Bed Mobility Overal bed mobility: Needs Assistance Bed Mobility: Rolling Rolling: Max assist            Transfers                 General transfer comment: pt too fatigued     Balance                                            ADL Overall ADL's : Needs assistance/impaired Eating/Feeding: Set up;Bed level   Grooming: Wash/dry hands;Wash/dry face;Oral care;Brushing hair;Set up;Bed level   Upper Body Bathing: Maximal assistance;Bed level   Lower Body Bathing: Maximal assistance;Bed level   Upper Body Dressing : Total assistance;Bed level   Lower Body Dressing: Maximal assistance;Bed level;Total assistance   Toilet Transfer: Total assistance Toilet Transfer Details (indicate cue type and reason): unable to safely perform  Toileting- Clothing  Manipulation and Hygiene: Total assistance;Bed level       Functional mobility during ADLs: Maximal assistance;+2 for physical assistance General ADL Comments: Pt very deconditioned.  Fatigues rapidly      Vision     Perception     Praxis      Pertinent Vitals/Pain Pain Assessment: No/denies pain     Hand Dominance Right   Extremity/Trunk Assessment Upper Extremity Assessment Upper Extremity Assessment: Generalized weakness (strength grossly 3+/5)   Lower Extremity Assessment Lower Extremity Assessment: Defer to PT evaluation   Cervical / Trunk Assessment Cervical / Trunk Assessment: Kyphotic   Communication Communication Communication: No difficulties   Cognition Arousal/Alertness: Awake/alert Behavior During Therapy: WFL for tasks assessed/performed Overall Cognitive Status: Within Functional Limits for tasks assessed                     General Comments       Exercises Exercises: General Upper Extremity;General Lower Extremity     Shoulder Instructions      Home Living Family/patient expects to be discharged to:: Skilled nursing facility Living Arrangements: Spouse/significant other   Type of Home: House                                  Prior Functioning/Environment Level of Independence: Independent with assistive  device(s)             OT Diagnosis: Generalized weakness   OT Problem List: Decreased strength;Decreased activity tolerance;Impaired balance (sitting and/or standing);Decreased coordination;Decreased safety awareness;Decreased knowledge of use of DME or AE;Obesity;Impaired UE functional use   OT Treatment/Interventions: Self-care/ADL training;Therapeutic exercise;DME and/or AE instruction;Energy conservation;Therapeutic activities;Patient/family education;Balance training    OT Goals(Current goals can be found in the care plan section) Acute Rehab OT Goals Patient Stated Goal: to regain strength and independence   OT Goal Formulation: With patient Time For Goal Achievement: 01/05/16 Potential to Achieve Goals: Good ADL Goals Pt Will Perform Upper Body Bathing: with min assist;sitting;bed level Additional ADL Goal #1: Pt will sit EOB x 10 mins with mod A while participating in therapeutic activity  Additional ADL Goal #2: Pt will demonstrate bil. UE strength 4/5 to increase ability to perform ADLs and assist with functional mobility  Additional ADL Goal #3: Pt will participate in 20 mins therapeutic activity with no more than 2 rest breaks to improve endurance as needed for ADLs.   OT Frequency: Min 2X/week   Barriers to D/C: Decreased caregiver support          Co-evaluation              End of Session Nurse Communication: Mobility status  Activity Tolerance: Patient limited by fatigue Patient left: in bed;with call bell/phone within reach;with bed alarm set   Time: 1513-1536 OT Time Calculation (min): 23 min Charges:  OT General Charges $OT Visit: 1 Procedure OT Evaluation $Initial OT Evaluation Tier I: 1 Procedure OT Treatments $Therapeutic Exercise: 8-22 mins G-Codes:    Ersie Savino M 01-03-16, 5:24 PM

## 2015-12-23 DIAGNOSIS — G729 Myopathy, unspecified: Secondary | ICD-10-CM

## 2015-12-23 LAB — RENAL FUNCTION PANEL
ANION GAP: 10 (ref 5–15)
Albumin: 2.3 g/dL — ABNORMAL LOW (ref 3.5–5.0)
BUN: 92 mg/dL — AB (ref 6–20)
CHLORIDE: 108 mmol/L (ref 101–111)
CO2: 26 mmol/L (ref 22–32)
CREATININE: 2.23 mg/dL — AB (ref 0.61–1.24)
Calcium: 7.3 mg/dL — ABNORMAL LOW (ref 8.9–10.3)
GFR calc non Af Amer: 28 mL/min — ABNORMAL LOW (ref >60)
GFR, EST AFRICAN AMERICAN: 33 mL/min — AB (ref >60)
GLUCOSE: 230 mg/dL — AB (ref 65–99)
PHOSPHORUS: 3.2 mg/dL (ref 2.5–4.6)
POTASSIUM: 3.7 mmol/L (ref 3.5–5.1)
Sodium: 144 mmol/L (ref 135–145)

## 2015-12-23 LAB — GLUCOSE, CAPILLARY
GLUCOSE-CAPILLARY: 166 mg/dL — AB (ref 65–99)
GLUCOSE-CAPILLARY: 197 mg/dL — AB (ref 65–99)
GLUCOSE-CAPILLARY: 208 mg/dL — AB (ref 65–99)
Glucose-Capillary: 198 mg/dL — ABNORMAL HIGH (ref 65–99)
Glucose-Capillary: 234 mg/dL — ABNORMAL HIGH (ref 65–99)

## 2015-12-23 LAB — PHOSPHORUS: Phosphorus: 3.3 mg/dL (ref 2.5–4.6)

## 2015-12-23 LAB — MAGNESIUM: Magnesium: 1.9 mg/dL (ref 1.7–2.4)

## 2015-12-23 MED ORDER — INSULIN GLARGINE 100 UNIT/ML ~~LOC~~ SOLN
53.0000 [IU] | Freq: Every day | SUBCUTANEOUS | Status: DC
  Administered 2015-12-23 – 2015-12-28 (×6): 53 [IU] via SUBCUTANEOUS
  Filled 2015-12-23 (×6): qty 0.53

## 2015-12-23 NOTE — Progress Notes (Signed)
Progress Note   Timothy [name] [id number] DOB: Feb 18, 1946 DOA: 12/06/2015 PCP: Marton Redwood, MD   Brief Narrative:   Timothy [name] is an 70 y.o. 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 Pt was under PCCM care and then transitioned to [facility] 12/19/2015. In short, initial hypoxia and shortness of breath thought to be due to GVHD. Pt is s/p bronch with BAL brushing and transbronchial biopsies 12/10/15. He was treated with abx for 7 days for strep bovis culture in the lung. He is being followed by his oncologist Dr. Jana Hakim, infectious disease and nephrology. Patient's creatinine was as high as 3.7 on the admission (and his baseline around 2.6 in 09/2015) and this acute on chronic renal failure thought to be due to prograf which has been on hold since. He continues to have diarrhea but his C.diff was negative 12/08/2015. Additionally, CMV 12/10/15 showed CMV IgG positive but IgM negative. He is on valaganciclovir which he needs to continue through 12/25/2015.   Assessment/Plan:    Acute respiratory failure with hypoxemia/HCAP/S. Bovis lung infection in an immunocompromised host. - Likely due to S. Bovis in the setting of immunocompromise and GVHD.  S/P Bronchoscopy/BAL. - Now resolved. - Continuing incentive spirometry while awake.  Graft-versus-host disease of the lungs - Improving chest x-ray imaging and oxygen requirement.  - Continue prednisone 50 mg BID through 01/05 and then needs repeat CT scan at which time pulmonary to decide on prednisone taper.  Deconditioning/weakness - Evaluated by physical therapist 12/22/15. SNF recommended. - OT evaluation performed 12/22/15. - Will need SNF.  SW to assist with placement.  Diarrhea - C.diff was negative. - Diarrhea improved since admission.  CMV IgG -  Continue Valcytel per ID recommendations through 12/25/2015.  Acute renal failrure superimposed on CKD stage 4 / hypocalcemia - Baseline Cr in 09/2015 was 2.6 and on this admission 3.7, now improved to 2.2. - Likely from prograf. Prograf given toxic level (12/26 24.4) and has been stopped. - Nephrology has seen the patient in consultation & recommends D/C of Prograf. - Continue sodium bicarbonate and Calcitrol on discharge.  CLL  - Post Transplant. - Continuing prednisone 50 mg by mouth twice a day as mentioned above.   Depression - Continue Zoloft.  - Stable.   Uncontrolled diabetes mellitus with diabetic nephropathy with long term insulin use - Currently being managed with Lantus 50 units daily and insulin resistant SSI.  Increase Lantus to 53 units. - CBGs [id number].  Essential hypertension - Continue labetalol 300 mg twice daily and Cardizem 240 mg daily.   Atrial fibrillation - CHADS vasc score at least 3. - Rate controlled with Cardizem.  - Not on AC due to thrombocytopenia.  Anemia of chronic disease / Thrombocytopenia - Due to history of CLL. - Monitor counts.    DVT Prophylaxis - SCDs ordered.   Family Communication/Anticipated D/C date and plan/Code Status   Family Communication: Wife updated at bedside 12/22/15. Disposition Plan: SNF when bed available. Anticipated D/C date:   1-2 days. Code Status: Full code.   IV Access:    Peripheral IV   Procedures and diagnostic studies:   Dg Chest 2 View  12/07/2015  CLINICAL DATA:  Shortness of breath, pre VQ scan. EXAM: CHEST  2 VIEW COMPARISON:  Chest CT 12/06/2015 FINDINGS: Focal consolidation noted in the right lung  base concerning for pneumonia, this is similar to prior CT. Minimal left basilar density, also similar. Heart is mildly enlarged. No effusions. No acute bony abnormality. IMPRESSION: Stable appearance of the bilateral lower lobe airspace opacities, right greater than left concerning for pneumonia.  Electronically Signed   By: Rolm Baptise M.D.   On: 12/07/2015 12:25   Ct Chest Wo Contrast  12/06/2015  CLINICAL DATA:  Shortness of breath. Kidney failure. Hypocalcemia. Hypoglycemia. History of leukemia, status post bone marrow transplant in 2013. EXAM: CT CHEST WITHOUT CONTRAST TECHNIQUE: Multidetector CT imaging of the chest was performed following the standard protocol without IV contrast. COMPARISON:  02/19/2015 FINDINGS: Mediastinum/Nodes: The right-sided Port-A-Cath which terminates at the superior caval/ atrial junction. Tortuous descending thoracic aorta. Mild cardiomegaly. Multivessel coronary artery atherosclerosis. No mediastinal or definite hilar adenopathy, given limitations of unenhanced CT. Lungs/Pleura: Tiny right-sided pleural effusion. Right worse than left bibasilar and dependent upper lobe airspace and ground-glass opacity with mild septal thickening. Upper abdomen: Trace perihepatic ascites. Normal imaged portions of the spleen, stomach, adrenal glands, kidneys. The gallbladder is incompletely imaged but may be distended. This is similar to on the prior exam. No calcified stone. Subtle edema in the porta hepatis, including on image 56/series 2. Musculoskeletal: Remote left-sided rib fractures. Osteopenia. Advanced thoracolumbar spondylosis. Mild superior endplate compression deformity at T4 is chronic. Interval healing of previously described T10 compression deformity. A moderate T12 compression deformity is not significantly changed. IMPRESSION: 1. Bilateral airspace and ground-glass opacities, favor infection. Given dependent distribution, aspiration could look similar. 2.  Atherosclerosis, including within the coronary arteries. 3. Cardiomegaly with small right pleural effusion. 4. Nonspecific gallbladder distension. Suggestion of edema in the porta hepatis. Correlate with upper abdominal symptoms. Consider correlation with pancreatic enzyme levels to exclude pancreatitis. Depending on  symptomatology, abdominal ultrasound or CT may be informative. 5. Trace perihepatic ascites. Electronically Signed   By: Abigail Miyamoto M.D.   On: 12/06/2015 18:46   Nm Pulmonary Perf And Vent  12/07/2015  CLINICAL DATA:  Shortness of breath for 1 and half weeks. Lower extremity swelling. EXAM: NUCLEAR MEDICINE VENTILATION - PERFUSION LUNG SCAN TECHNIQUE: Ventilation images were obtained in multiple projections using inhaled aerosol Tc-20m DTPA. Perfusion images were obtained in multiple projections after intravenous injection of Tc-49m MAA. RADIOPHARMACEUTICALS:  31 millicurie [id number] DTPA aerosol inhalation and 0000000 millicurie [id number] MAA IV COMPARISON:  12/07/2015 FINDINGS: Ventilation: No focal ventilation defect. Central scratch set deposition of the radiopharmaceutical within the central airway is noted. Perfusion: No wedge shaped peripheral perfusion defects to suggest acute pulmonary embolism. IMPRESSION: Low probability for acute pulmonary embolus. Electronically Signed   By: Kerby Moors M.D.   On: 12/07/2015 15:38   US Renal Port  12/06/2015  CLINICAL DATA:  Acute renal failure EXAM: RENAL / URINARY TRACT ULTRASOUND COMPLETE COMPARISON:  02/19/2015 FINDINGS: Right Kidney: Length: 11.6 cm. Echogenicity within normal limits. No mass or hydronephrosis visualized. Left Kidney: Length: 10.9 cm. Echogenicity within normal limits. No mass or hydronephrosis visualized. Bladder: Limit assessment.  Under distended.  Grossly unremarkable. IMPRESSION: No acute finding by renal ultrasound. Electronically Signed   By: Jerilynn Mages.  Shick M.D.   On: 12/06/2015 21:13     Medical Consultants:    Oncology: Dr. Jana Hakim  Nephrology: Dr. Melvia Heaps  PCCM  Anti-Infectives:   Valcyte 12/27>>> Diflucan 12/15>>> Ganciclovir 12/21 - 12/27 Levaquin 12/20 - 12/26 Bactrim 12/17 - 12/20 Acyclovir 12/15 - 12/21  Subjective:   Timothy [name] feels weak, stood and took a few steps  with PT yesterday.No  nausea or vomiting. Denies shortness of breath.  Appetite remains poor but is trying to increase intake.  Objective:    Filed Vitals:   12/22/15 0834 12/22/15 1341 12/22/15 1939 12/23/15 0607  BP: 134/80 129/82 147/83 130/78  Pulse: 77 77 76 70  Temp:  99.1 F (37.3 C) 97.9 F (36.6 C) 97.6 F (36.4 C)  TempSrc:  Oral Oral Oral  Resp:  18 18 16   Height:      Weight:    100.1 kg (220 lb 10.9 oz)  SpO2:  94% 97% 96%    Intake/Output Summary (Last 24 hours) at 12/23/15 0842 Last data filed at 12/22/15 1248  Gross per 24 hour  Intake      0 ml  Output    500 ml  Net   -500 ml   Filed Weights   12/21/15 0542 12/22/15 0457 12/23/15 0607  Weight: 98.9 kg (218 lb 0.6 oz) 99.7 kg (219 lb 12.8 oz) 100.1 kg (220 lb 10.9 oz)    Exam: Gen:  NAD Cardiovascular:  RRR, No M/R/G Respiratory:  Lungs CTAB Gastrointestinal:  Abdomen soft, NT/ND, + BS Extremities:  2+ edema   Data Reviewed:    Labs: Basic Metabolic Panel:  Recent Labs Lab 12/19/15 0400 12/20/15 0425 12/20/15 0830 12/21/15 0513 12/22/15 0440 12/23/15 0428  NA 146*  --  145 145 145 144  K 3.4*  --  3.5 3.3* 3.8 3.7  CL 106  --  105 106 109 108  CO2 25  --  24 27 26 26   GLUCOSE 241*  --  203* 200* 210* 230*  BUN 136*  --  128* 120* 105* 92*  CREATININE 3.22*  --  3.10* 2.70* 2.39* 2.23*  CALCIUM 6.6*  --  6.7* 7.0* 7.3* 7.3*  MG 1.9 1.9  --  2.0 1.9 1.9  PHOS 4.7* 4.1 4.1 3.7  3.6 3.1  3.0 3.3  3.2   GFR Estimated Creatinine Clearance: 35.2 mL/min (by C-G formula based on Cr of 2.23). Liver Function Tests:  Recent Labs Lab 12/17/15 0350 12/18/15 0453 12/20/15 0830 12/21/15 0513 12/22/15 0440 12/23/15 0428  AST 42* 48*  --   --   --   --   ALT 32 35  --   --   --   --   ALKPHOS 87 91  --   --   --   --   BILITOT 1.3* 1.3*  --   --   --   --   PROT 5.5* 5.1*  --   --   --   --   ALBUMIN 2.6* 2.5* 2.4* 2.4* 2.4* 2.3*   CBC:  Recent Labs Lab 12/17/15 0350 12/18/15 0453 12/19/15 0400  12/20/15 0425  WBC 3.4* 4.4 4.6 5.3  HGB 10.6* 11.1* 11.4* 11.6*  HCT 32.8* 34.3* 35.9* 36.6*  MCV 89.9 90.5 90.9 91.0  PLT 104* 115* 117* 126*    CBG:  Recent Labs Lab 12/22/15 0814 12/22/15 1156 12/22/15 1636 12/22/15 2109 12/23/15 0725  GLUCAP 184* 224* 191* 166* 198*   Microbiology No results found for this or any previous visit (from the past 240 hour(s)).   Medications:   . sodium chloride   Intravenous Once  . antiseptic oral rinse  7 mL Mouth Rinse BID  . calcitRIOL  0.25 mcg Oral Daily  . calcium gluconate  1 g Intravenous BID  . cholestyramine  4 g Oral BID  . diltiazem  240 mg Oral  Daily  . feeding supplement (GLUCERNA SHAKE)  237 mL Oral BID BM  . feeding supplement (NEPRO CARB STEADY)  237 mL Oral Q24H  . fluconazole  100 mg Oral Daily  . furosemide  80 mg Oral BID  . insulin aspart  0-20 Units Subcutaneous TID WC  . insulin aspart  0-5 Units Subcutaneous QHS  . insulin glargine  50 Units Subcutaneous Daily  . labetalol  300 mg Oral BID  . loratadine  10 mg Oral Daily  . pantoprazole  40 mg Oral Daily  . predniSONE  50 mg Oral BID WC  . sertraline  50 mg Oral q morning - 10a  . sodium bicarbonate  1,300 mg Oral BID  . sodium chloride  10-40 mL Intracatheter Q12H  . valGANciclovir  450 mg Oral Q24H   Continuous Infusions:   Time spent: 25 minutes.    LOS: 17 days   [facility] Pager [phone]. If unable to reach me by pager, please call my cell phone at [phone].  *Please refer to [email or web address], password [email or web address] to get updated schedule on who will round on this patient, as hospitalists switch teams weekly. If 7PM-7AM, please contact night-coverage at [email or web address], password [email or web address] for any overnight needs.  12/23/2015, 8:42 AM

## 2015-12-23 NOTE — Clinical Social Work Note (Signed)
Clinical Social Work Assessment  Patient Details  Name: Timothy [name] MRN: [id number] Date of Birth: 04/14/1946  Date of referral:  12/23/15               Reason for consult:  Facility Placement                Permission sought to share information with:  Facility Sport and exercise psychologist, Family Supports Permission granted to share information::  Yes, Verbal Permission Granted  Name::        Agency::     Relationship::     Contact Information:     Housing/Transportation Living arrangements for the past 2 months:  Single Family Home Source of Information:  Patient, Spouse Patient Interpreter Needed:  None Criminal Activity/Legal Involvement Pertinent to Current Situation/Hospitalization:    Significant Relationships:  Adult Children, Spouse Lives with:  Spouse Do you feel safe going back to the place where you live?    Need for family participation in patient care:  Yes (Comment)  Care giving concerns:  Pt's wife has no concerns   Facilities manager / plan:  CSW reviewed pt chart reflecting a recommendation of skilled facility.  CSW reviewed discharge summary written on 12/21/15 and per MD notes and wife's input pt could not be discharged without his  cancer doctor's input and he was out until tomorrow.  Pt also required kidney doctor to assess and per chart he is discharging from pt care today.  Pt had bone marrow transplant 3 years ago and his Care is being coordinated with the hospital that did it in [place].  CSW will send pt information to SNF for possible rehab  Employment status:  Part-Time (teaches business at college) Insurance information:  Other (Comment Required), Medicare (blue cross and medicare a only) PT Recommendations:  [place] / Referral to community resources:     Patient/Family's Response to care:  Pt and wife discussed long hospital stay and discussed waiting for Dr. Young Berry from [facility] to return on Monday before he  could discharge.    Patient/Family's Understanding of and Emotional Response to Diagnosis, Current Treatment, and Prognosis:  Pt and wife appear to understand that pt has been in hospital awhile and requires many doctors to release him.  Pt's wife thought that she would be able to take him home until this weekend when she spoke with additional doctors.   Emotional Assessment Appearance:  Appears stated age Attitude/Demeanor/Rapport:  Lethargic Affect (typically observed):  Accepting Orientation:  Oriented to Self, Oriented to Place, Oriented to  Time, Oriented to Situation Alcohol / Substance use:    Psych involvement (Current and /or in the community):     Discharge Needs  Concerns to be addressed:    Readmission within the last 30 days:    Current discharge risk:    Barriers to Discharge:  No Barriers Identified   Carlean Jews, LCSW 12/23/2015, 5:15 PM

## 2015-12-23 NOTE — Progress Notes (Signed)
[facility] Progress Note   Subjective: no issues. Creat 2.23, bun down 92.   Filed Vitals:   12/22/15 0834 12/22/15 1341 12/22/15 1939 12/23/15 0607  BP: 134/80 129/82 147/83 130/78  Pulse: 77 77 76 70  Temp:  99.1 F (37.3 C) 97.9 F (36.6 C) 97.6 F (36.4 C)  TempSrc:  Oral Oral Oral  Resp:  18 18 16   Height:      Weight:    100.1 kg (220 lb 10.9 oz)  SpO2:  94% 97% 96%    Inpatient medications: . sodium chloride   Intravenous Once  . antiseptic oral rinse  7 mL Mouth Rinse BID  . calcitRIOL  0.25 mcg Oral Daily  . calcium gluconate  1 g Intravenous BID  . cholestyramine  4 g Oral BID  . diltiazem  240 mg Oral Daily  . feeding supplement (GLUCERNA SHAKE)  237 mL Oral BID BM  . feeding supplement (NEPRO CARB STEADY)  237 mL Oral Q24H  . fluconazole  100 mg Oral Daily  . furosemide  80 mg Oral BID  . insulin aspart  0-20 Units Subcutaneous TID WC  . insulin aspart  0-5 Units Subcutaneous QHS  . insulin glargine  53 Units Subcutaneous Daily  . labetalol  300 mg Oral BID  . loratadine  10 mg Oral Daily  . pantoprazole  40 mg Oral Daily  . predniSONE  50 mg Oral BID WC  . sertraline  50 mg Oral q morning - 10a  . sodium bicarbonate  1,300 mg Oral BID  . sodium chloride  10-40 mL Intracatheter Q12H  . valGANciclovir  450 mg Oral Q24H     HYDROcodone-acetaminophen, hydrocortisone, LORazepam, menthol-cetylpyridinium, morphine injection, ondansetron (ZOFRAN) IV, prochlorperazine, sodium chloride, technetium TC 75M diethylenetriame-pentaacetic acid  Exam: Alert, calm, looks better, no distress, chron ill No jvd Chest left base rales, R clear RRR  Abd obese, distended poss ascites nontender Diffuse 2+ edema LE's/ flanks/ UE's Neuro Ox3 gen'd weak UA 12/16 neg protein, no rbc/wbc  Assessment: 1 AKI/ CKD 4 due to tac toxicity, improved off tacrolimus 2 Pulm infiltrates/ GVHD - per pulm felt to have GHVD of lungs, responded to high-dose steroids. CXR  now normal. Pred 50 bid 3 Vol excess- diffuse depend edema, resumed OP dose of lasix 80 bid 4 BMT 2013 5 HTN labetalol / dilt 6 Hypocalcemia added vit D 7 Afib on po dilt 8 possible CMV infect, on valganciclovir d# 12/14 8 Debility - PT working w patient, very weak  Plan - adjust po lasix as needed, no other suggestions.  Will sign off, please call as needed.   Kelly Splinter MD [facility] pager [phone]    cell [phone] 12/23/2015, 12:27 PM    Recent Labs Lab 12/21/15 0513 12/22/15 0440 12/23/15 0428  NA 145 145 144  K 3.3* 3.8 3.7  CL 106 109 108  CO2 27 26 26   GLUCOSE 200* 210* 230*  BUN 120* 105* 92*  CREATININE 2.70* 2.39* 2.23*  CALCIUM 7.0* 7.3* 7.3*  PHOS 3.7  3.6 3.1  3.0 3.3  3.2    Recent Labs Lab 12/17/15 0350 12/18/15 0453  12/21/15 0513 12/22/15 0440 12/23/15 0428  AST 42* 48*  --   --   --   --   ALT 32 35  --   --   --   --   ALKPHOS 87 91  --   --   --   --   BILITOT  1.3* 1.3*  --   --   --   --   PROT 5.5* 5.1*  --   --   --   --   ALBUMIN 2.6* 2.5*  < > 2.4* 2.4* 2.3*  < > = values in this interval not displayed.  Recent Labs Lab 12/18/15 0453 12/19/15 0400 12/20/15 0425  WBC 4.4 4.6 5.3  HGB 11.1* 11.4* 11.6*  HCT 34.3* 35.9* 36.6*  MCV 90.5 90.9 91.0  PLT 115* 117* 126*

## 2015-12-23 NOTE — NC FL2 (Signed)
[place] LEVEL OF CARE SCREENING TOOL     IDENTIFICATION  Patient Name: Timothy [name] Birthdate: August 26, 1946 Sex: male Admission Date (Current Location): 12/06/2015  [facility] and [place] Number:  Herbalist and Address:  [facility],  [place] [address], [place]      Provider Number: [id number]  Attending Physician Name and Address:  Venetia Maxon Rama, MD  Relative Name and Phone Number:       Current Level of Care: Hospital Recommended Level of Care: [place] Prior Approval Number:    Date Approved/Denied:   PASRR Number: [id number]  Discharge Plan: SNF    Current Diagnoses: Patient Active Problem List   Diagnosis Date Noted  . Acute on chronic graft-versus-host disease ([place])   . Essential hypertension   . Acute renal failure superimposed on stage 4 chronic kidney disease ([place])   . Uncontrolled type 2 diabetes mellitus with diabetic nephropathy, with long-term current use of insulin ([place])   . Acute on chronic renal failure ([place])   . Acute respiratory failure ([place])   . Pulmonary infiltrate   . PNA (pneumonia)   . Shortness of breath 12/06/2015  . Hypogammaglobulinemia ([place]) 05/10/2015  . Peripheral edema 03/10/2015  . Chronic renal insufficiency 02/15/2015  . Fall 02/15/2015  . Diarrhea 02/15/2015  . Weakness 02/15/2015  . Creatinine elevation 08/24/2014  . Hyperkalemia 08/24/2014  . Osteoporosis 03/15/2014  . Steroid-induced diabetes mellitus ([place]) 12/05/2013  . Left hip pain 12/01/2013  . Anemia, unspecified 10/20/2013  . Other selective immunoglobulin deficiencies 02/25/2013  . Hypomagnesemia 01/26/2013  . Chronic back pain 12/14/2012  . Physical deconditioning 12/09/2012  . Chronic GVHD complicating bone marrow transplantation ([place]) 12/05/2012  . CLL (chronic lymphocytic leukemia) ([place]) 12/05/2012  . Immunocompromised ([place]) 08/24/2012  . 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 Diverticular  disease   . Hyperlipidemia   . Obesity   . Hypertension   . Hiatal hernia   . CMV (cytomegalovirus) antibody positive   . Right bundle branch block   . CKD (chronic kidney disease) stage 2, GFR 60-89 ml/min   . Atrial fibrillation ([place])   . Myopathy   . Fine tremor     Orientation RESPIRATION BLADDER Height & Weight    Self, Time, Situation, Place  Normal Incontinent 5\' 7"  (170.2 cm) 220 lbs.  BEHAVIORAL SYMPTOMS/MOOD NEUROLOGICAL BOWEL NUTRITION STATUS      Continent Diet (low sodium heart healthy)  AMBULATORY STATUS COMMUNICATION OF NEEDS Skin   Extensive Assist Verbally Surgical wounds                       Personal Care Assistance Level of Assistance  Bathing, Dressing Bathing Assistance: Maximum assistance   Dressing Assistance: Maximum assistance     Functional Limitations Info  Sight Sight Info: Impaired        SPECIAL CARE FACTORS FREQUENCY                       Contractures      Additional Factors Info  Allergies   Allergies Info: Benadryl           Current Medications (12/23/2015):  This is the current hospital active medication list Current Facility-Administered Medications  Medication Dose Route Frequency Provider Last Rate Last Dose  . 0.9 %  sodium chloride infusion   Intravenous Once Praveen Mannam, MD      . antiseptic oral rinse (CPC / CETYLPYRIDINIUM CHLORIDE 0.05%)  solution 7 mL  7 mL Mouth Rinse BID Chauncey Cruel, MD   7 mL at 12/23/15 1000  . calcitRIOL (ROCALTROL) capsule 0.25 mcg  0.25 mcg Oral Daily Roney Jaffe, MD   0.25 mcg at 12/23/15 1100  . calcium gluconate 1 g in sodium chloride 0.9 % 100 mL IVPB  1 g Intravenous BID Chauncey Cruel, MD   1 g at 12/23/15 0757  . cholestyramine (QUESTRAN) packet 4 g  4 g Oral BID Chauncey Cruel, MD   4 g at 12/23/15 1100  . diltiazem (CARDIZEM CD) 24 hr capsule 240 mg  240 mg Oral Daily Erick Colace, NP   240 mg at 12/23/15 1100  . feeding supplement (GLUCERNA SHAKE)  (GLUCERNA SHAKE) liquid 237 mL  237 mL Oral BID BM Maricela Bo Ostheim, RD   237 mL at 12/23/15 1500  . feeding supplement (NEPRO CARB STEADY) liquid 237 mL  237 mL Oral Q24H Maricela Bo Ostheim, RD   237 mL at 12/23/15 1030  . fluconazole (DIFLUCAN) tablet 100 mg  100 mg Oral Daily Chauncey Cruel, MD   100 mg at 12/23/15 0757  . furosemide (LASIX) tablet 80 mg  80 mg Oral BID Roney Jaffe, MD   80 mg at 12/23/15 0757  . HYDROcodone-acetaminophen (NORCO/VICODIN) 5-325 MG per tablet 1-2 tablet  1-2 tablet Oral Q4H PRN Chauncey Cruel, MD      . hydrocortisone (ANUSOL-HC) 2.5 % rectal cream   Rectal TID PRN Chauncey Cruel, MD      . insulin aspart (novoLOG) injection 0-20 Units  0-20 Units Subcutaneous TID WC Javier Glazier, MD   7 Units at 12/23/15 1241  . insulin aspart (novoLOG) injection 0-5 Units  0-5 Units Subcutaneous QHS Javier Glazier, MD   2 Units at 12/21/15 2259  . insulin glargine (LANTUS) injection 53 Units  53 Units Subcutaneous Daily Venetia Maxon Rama, MD   53 Units at 12/23/15 1100  . labetalol (NORMODYNE) tablet 300 mg  300 mg Oral BID Marijean Heath, NP   300 mg at 12/23/15 0757  . loratadine (CLARITIN) tablet 10 mg  10 mg Oral Daily Chauncey Cruel, MD   10 mg at 12/23/15 0757  . LORazepam (ATIVAN) injection 0.5 mg  0.5 mg Intravenous QHS PRN,MR X 1 Chauncey Cruel, MD   0.5 mg at 12/15/15 0103  . menthol-cetylpyridinium (CEPACOL) lozenge 3 mg  1 lozenge Oral PRN Chauncey Cruel, MD   3 mg at 12/14/15 1659  . morphine 2 MG/ML injection 1 mg  1 mg Intravenous Q2H PRN Chauncey Cruel, MD   1 mg at 12/12/15 2215  . ondansetron (ZOFRAN) injection 4 mg  4 mg Intravenous Q4H PRN Mikael Spray, NP   4 mg at 12/22/15 1759  . pantoprazole (PROTONIX) EC tablet 40 mg  40 mg Oral Daily Chauncey Cruel, MD   40 mg at 12/23/15 0757  . predniSONE (DELTASONE) tablet 50 mg  50 mg Oral BID WC Javier Glazier, MD   50 mg at 12/23/15 0757  . prochlorperazine  (COMPAZINE) tablet 10 mg  10 mg Oral Q6H PRN Chauncey Cruel, MD      . sertraline (ZOLOFT) tablet 50 mg  50 mg Oral q morning - 10a Chauncey Cruel, MD   50 mg at 12/23/15 0757  . sodium bicarbonate tablet 1,300 mg  1,300 mg Oral BID Roney Jaffe, MD   1,300 mg at 12/23/15  1100  . sodium chloride 0.9 % injection 10-40 mL  10-40 mL Intracatheter Q12H Praveen Mannam, MD   10 mL at 12/23/15 1000  . sodium chloride 0.9 % injection 10-40 mL  10-40 mL Intracatheter PRN Praveen Mannam, MD   10 mL at 12/22/15 0856  . technetium TC 29M diethylenetriame-pentaacetic acid (DTPA) injection 31 milli [place]  31 milli [place]ie Inhalation Once PRN Chauncey Cruel, MD      . valGANciclovir (VALCYTE) 450 MG tablet TABS 450 mg  450 mg Oral Q24H Berton Mount, RPH   450 mg at 12/23/15 1500   Facility-Administered Medications Ordered in Other Encounters  Medication Dose Route Frequency Provider Last Rate Last Dose  . 0.9 %  sodium chloride infusion   Intravenous Continuous Amy G Berry, PA-C 500 mL/hr at 03/12/13 0900    . sodium chloride 0.9 % injection 10 mL  10 mL Intravenous PRN Chauncey Cruel, MD   10 mL at 08/11/12 1606     Discharge Medications: Please see discharge summary for a list of discharge medications.  Relevant Imaging Results:  Relevant Lab Results:   Additional Information ss [id number]  Carlean Jews, LCSW

## 2015-12-23 NOTE — NC FL2 (Signed)
[place] LEVEL OF CARE SCREENING TOOL     IDENTIFICATION  Patient Name: Timothy [name] Birthdate: 1946/06/16 Sex: male Admission Date (Current Location): 12/06/2015  [facility] and [place] Number:  Herbalist and Address:  [facility],  [place] [address] [place]      Provider Number: [id number]  Attending Physician Name and Address:  Venetia Maxon Rama, MD  Relative Name and Phone Number:       Current Level of Care: Hospital Recommended Level of Care: [place] Prior Approval Number:    Date Approved/Denied:   PASRR Number:    Discharge Plan: SNF    Current Diagnoses: Patient Active Problem List   Diagnosis Date Noted  . Acute on chronic graft-versus-host disease ([place])   . Essential hypertension   . Acute renal failure superimposed on stage 4 chronic kidney disease ([place])   . Uncontrolled type 2 diabetes mellitus with diabetic nephropathy, with long-term current use of insulin ([place])   . Acute on chronic renal failure ([place])   . Acute respiratory failure ([place])   . Pulmonary infiltrate   . PNA (pneumonia)   . Shortness of breath 12/06/2015  . Hypogammaglobulinemia ([place]) 05/10/2015  . Peripheral edema 03/10/2015  . Chronic renal insufficiency 02/15/2015  . Fall 02/15/2015  . Diarrhea 02/15/2015  . Weakness 02/15/2015  . Creatinine elevation 08/24/2014  . Hyperkalemia 08/24/2014  . Osteoporosis 03/15/2014  . Steroid-induced diabetes mellitus ([place]) 12/05/2013  . Left hip pain 12/01/2013  . Anemia, unspecified 10/20/2013  . Other selective immunoglobulin deficiencies 02/25/2013  . Hypomagnesemia 01/26/2013  . Chronic back pain 12/14/2012  . Physical deconditioning 12/09/2012  . Chronic GVHD complicating bone marrow transplantation ([place]) 12/05/2012  . CLL (chronic lymphocytic leukemia) ([place]) 12/05/2012  . Immunocompromised ([place]) 08/24/2012  . 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 Diverticular disease   .  Hyperlipidemia   . Obesity   . Hypertension   . Hiatal hernia   . CMV (cytomegalovirus) antibody positive   . Right bundle branch block   . CKD (chronic kidney disease) stage 2, GFR 60-89 ml/min   . Atrial fibrillation ([place])   . Myopathy   . Fine tremor     Orientation RESPIRATION BLADDER Height & Weight    Self, Time, Situation, Place  Normal Incontinent 5\' 7"  (170.2 cm) 220 lbs.  BEHAVIORAL SYMPTOMS/MOOD NEUROLOGICAL BOWEL NUTRITION STATUS      Continent Diet (low sodium heart healthy)  AMBULATORY STATUS COMMUNICATION OF NEEDS Skin   Extensive Assist Verbally Surgical wounds                       Personal Care Assistance Level of Assistance  Bathing, Dressing Bathing Assistance: Maximum assistance   Dressing Assistance: Maximum assistance     Functional Limitations Info  Sight Sight Info: Impaired        SPECIAL CARE FACTORS FREQUENCY                       Contractures      Additional Factors Info  Allergies   Allergies Info: Benadryl           Current Medications (12/23/2015):  This is the current hospital active medication list Current Facility-Administered Medications  Medication Dose Route Frequency Provider Last Rate Last Dose  . 0.9 %  sodium chloride infusion   Intravenous Once Praveen Mannam, MD      . antiseptic oral rinse (CPC / CETYLPYRIDINIUM CHLORIDE  0.05%) solution 7 mL  7 mL Mouth Rinse BID Chauncey Cruel, MD   7 mL at 12/23/15 1000  . calcitRIOL (ROCALTROL) capsule 0.25 mcg  0.25 mcg Oral Daily Roney Jaffe, MD   0.25 mcg at 12/23/15 1100  . calcium gluconate 1 g in sodium chloride 0.9 % 100 mL IVPB  1 g Intravenous BID Chauncey Cruel, MD   1 g at 12/23/15 0757  . cholestyramine (QUESTRAN) packet 4 g  4 g Oral BID Chauncey Cruel, MD   4 g at 12/23/15 1100  . diltiazem (CARDIZEM CD) 24 hr capsule 240 mg  240 mg Oral Daily Erick Colace, NP   240 mg at 12/23/15 1100  . feeding supplement (GLUCERNA SHAKE) (GLUCERNA  SHAKE) liquid 237 mL  237 mL Oral BID BM Maricela Bo Ostheim, RD   237 mL at 12/23/15 1500  . feeding supplement (NEPRO CARB STEADY) liquid 237 mL  237 mL Oral Q24H Maricela Bo Ostheim, RD   237 mL at 12/23/15 1030  . fluconazole (DIFLUCAN) tablet 100 mg  100 mg Oral Daily Chauncey Cruel, MD   100 mg at 12/23/15 0757  . furosemide (LASIX) tablet 80 mg  80 mg Oral BID Roney Jaffe, MD   80 mg at 12/23/15 0757  . HYDROcodone-acetaminophen (NORCO/VICODIN) 5-325 MG per tablet 1-2 tablet  1-2 tablet Oral Q4H PRN Chauncey Cruel, MD      . hydrocortisone (ANUSOL-HC) 2.5 % rectal cream   Rectal TID PRN Chauncey Cruel, MD      . insulin aspart (novoLOG) injection 0-20 Units  0-20 Units Subcutaneous TID WC Javier Glazier, MD   7 Units at 12/23/15 1241  . insulin aspart (novoLOG) injection 0-5 Units  0-5 Units Subcutaneous QHS Javier Glazier, MD   2 Units at 12/21/15 2259  . insulin glargine (LANTUS) injection 53 Units  53 Units Subcutaneous Daily Venetia Maxon Rama, MD   53 Units at 12/23/15 1100  . labetalol (NORMODYNE) tablet 300 mg  300 mg Oral BID Marijean Heath, NP   300 mg at 12/23/15 0757  . loratadine (CLARITIN) tablet 10 mg  10 mg Oral Daily Chauncey Cruel, MD   10 mg at 12/23/15 0757  . LORazepam (ATIVAN) injection 0.5 mg  0.5 mg Intravenous QHS PRN,MR X 1 Chauncey Cruel, MD   0.5 mg at 12/15/15 0103  . menthol-cetylpyridinium (CEPACOL) lozenge 3 mg  1 lozenge Oral PRN Chauncey Cruel, MD   3 mg at 12/14/15 1659  . morphine 2 MG/ML injection 1 mg  1 mg Intravenous Q2H PRN Chauncey Cruel, MD   1 mg at 12/12/15 2215  . ondansetron (ZOFRAN) injection 4 mg  4 mg Intravenous Q4H PRN Mikael Spray, NP   4 mg at 12/22/15 1759  . pantoprazole (PROTONIX) EC tablet 40 mg  40 mg Oral Daily Chauncey Cruel, MD   40 mg at 12/23/15 0757  . predniSONE (DELTASONE) tablet 50 mg  50 mg Oral BID WC Javier Glazier, MD   50 mg at 12/23/15 0757  . prochlorperazine (COMPAZINE) tablet  10 mg  10 mg Oral Q6H PRN Chauncey Cruel, MD      . sertraline (ZOLOFT) tablet 50 mg  50 mg Oral q morning - 10a Chauncey Cruel, MD   50 mg at 12/23/15 0757  . sodium bicarbonate tablet 1,300 mg  1,300 mg Oral BID Roney Jaffe, MD   1,300 mg at  12/23/15 1100  . sodium chloride 0.9 % injection 10-40 mL  10-40 mL Intracatheter Q12H Praveen Mannam, MD   10 mL at 12/23/15 1000  . sodium chloride 0.9 % injection 10-40 mL  10-40 mL Intracatheter PRN Praveen Mannam, MD   10 mL at 12/22/15 0856  . technetium TC 87M diethylenetriame-pentaacetic acid (DTPA) injection 31 milli Curie  31 milli [place] Inhalation Once PRN Chauncey Cruel, MD      . valGANciclovir (VALCYTE) 450 MG tablet TABS 450 mg  450 mg Oral Q24H Berton Mount, RPH   450 mg at 12/23/15 1500   Facility-Administered Medications Ordered in Other Encounters  Medication Dose Route Frequency Provider Last Rate Last Dose  . 0.9 %  sodium chloride infusion   Intravenous Continuous Amy G Berry, PA-C 500 mL/hr at 03/12/13 0900    . sodium chloride 0.9 % injection 10 mL  10 mL Intravenous PRN Chauncey Cruel, MD   10 mL at 08/11/12 1606     Discharge Medications: Please see discharge summary for a list of discharge medications.  Relevant Imaging Results:  Relevant Lab Results:   Additional Information ss [id number]  Carlean Jews, LCSW

## 2015-12-24 LAB — GLUCOSE, CAPILLARY
GLUCOSE-CAPILLARY: 200 mg/dL — AB (ref 65–99)
GLUCOSE-CAPILLARY: 167 mg/dL — AB (ref 65–99)
Glucose-Capillary: 200 mg/dL — ABNORMAL HIGH (ref 65–99)
Glucose-Capillary: 150 mg/dL — ABNORMAL HIGH (ref 65–99)

## 2015-12-24 LAB — MAGNESIUM: Magnesium: 1.8 mg/dL (ref 1.7–2.4)

## 2015-12-24 LAB — RENAL FUNCTION PANEL
ALBUMIN: 2.3 g/dL — AB (ref 3.5–5.0)
ANION GAP: 10 (ref 5–15)
BUN: 87 mg/dL — ABNORMAL HIGH (ref 6–20)
CALCIUM: 7.7 mg/dL — AB (ref 8.9–10.3)
CO2: 25 mmol/L (ref 22–32)
Chloride: 111 mmol/L (ref 101–111)
Creatinine, Ser: 2.02 mg/dL — ABNORMAL HIGH (ref 0.61–1.24)
GFR calc non Af Amer: 32 mL/min — ABNORMAL LOW (ref >60)
GFR, EST AFRICAN AMERICAN: 37 mL/min — AB (ref >60)
GLUCOSE: 221 mg/dL — AB (ref 65–99)
PHOSPHORUS: 3.4 mg/dL (ref 2.5–4.6)
POTASSIUM: 4 mmol/L (ref 3.5–5.1)
Sodium: 146 mmol/L — ABNORMAL HIGH (ref 135–145)

## 2015-12-24 LAB — PHOSPHORUS: Phosphorus: 3.3 mg/dL (ref 2.5–4.6)

## 2015-12-24 NOTE — Progress Notes (Signed)
Progress Note   Timothy [name] [id number] DOB: [birth year] DOA: 12/06/2015 PCP: Marton Redwood, MD   Brief Narrative:   Timothy [name] is an 70 y.o. 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 Pt was under PCCM care and then transitioned to [facility] 12/19/2015. In short, initial hypoxia and shortness of breath thought to be due to GVHD. Pt is s/p bronch with BAL brushing and transbronchial biopsies 12/10/15. He was treated with abx for 7 days for strep bovis culture in the lung. He is being followed by his oncologist Dr. Jana Hakim, infectious disease and nephrology. Patient's creatinine was as high as 3.7 on the admission (and his baseline around 2.6 in 09/2015) and this acute on chronic renal failure thought to be due to prograf which has been on hold since. He continues to have diarrhea but his C.diff was negative 12/08/2015. Additionally, CMV 12/10/15 showed CMV IgG positive but IgM negative. He is on valaganciclovir which he needs to continue through 12/25/2015.   Assessment/Plan:    Acute respiratory failure with hypoxemia/HCAP/S. Bovis lung infection in an immunocompromised host. - Likely due to S. Bovis in the setting of immunocompromise and GVHD.  S/P Bronchoscopy/BAL. - Now resolved. - Continuing incentive spirometry while awake.  Graft-versus-host disease of the lungs - Improving chest x-ray imaging and oxygen requirement.  - Continue prednisone 50 mg BID through 01/05 and then needs repeat CT scan at which time pulmonary to decide on prednisone taper.  Deconditioning/weakness - Evaluated by physical therapist 12/22/15. SNF recommended. - OT evaluation performed 12/22/15. - Will need SNF.  SW to assist with placement.  Diarrhea - C.diff was negative. - Diarrhea improved since admission.  CMV IgG -  Continue Valcytel per ID recommendations through 12/25/2015.  Acute renal failrure superimposed on CKD stage 4 / hypocalcemia - Baseline Cr in 09/2015 was 2.6 and on this admission 3.7, now improved to 2.02. - Likely from prograf. Prograf given toxic level (12/26 24.4) and has been stopped. - Nephrology has seen the patient in consultation & recommends D/C of Prograf. - Continue sodium bicarbonate and Calcitrol on discharge.  CLL  - Post Transplant. - Continuing prednisone 50 mg by mouth twice a day as mentioned above.   Depression - Continue Zoloft.  - Stable.   Uncontrolled diabetes mellitus with diabetic nephropathy with long term insulin use - Currently being managed with Lantus 50 units daily and insulin resistant SSI.  Increase Lantus to 53 units. - CBGs [id number].  Essential hypertension - Continue labetalol 300 mg twice daily and Cardizem 240 mg daily.   Atrial fibrillation - CHADS vasc score at least 3. - Rate controlled with Cardizem.  - Not on AC due to thrombocytopenia.  Anemia of chronic disease / Thrombocytopenia - Due to history of CLL. - Monitor counts.    DVT Prophylaxis - SCDs ordered.   Family Communication/Anticipated D/C date and plan/Code Status   Family Communication: Wife updated at bedside 12/22/15. Disposition Plan: SNF when bed available. Anticipated D/C date:   Today or tomorrow depending on bed choice. Code Status: Full code.   IV Access:    Peripheral IV   Procedures and diagnostic studies:   Dg Chest 2 View  12/07/2015  CLINICAL DATA:  Shortness of breath, pre VQ scan. EXAM: CHEST  2 VIEW COMPARISON:  Chest CT 12/06/2015 FINDINGS: Focal consolidation  noted in the right lung base concerning for pneumonia, this is similar to prior CT. Minimal left basilar density, also similar. Heart is mildly enlarged. No effusions. No acute bony abnormality. IMPRESSION: Stable appearance of the bilateral lower lobe airspace opacities, right greater  than left concerning for pneumonia. Electronically Signed   By: Rolm Baptise M.D.   On: 12/07/2015 12:25   Ct Chest Wo Contrast  12/06/2015  CLINICAL DATA:  Shortness of breath. Kidney failure. Hypocalcemia. Hypoglycemia. History of leukemia, status post bone marrow transplant in 2013. EXAM: CT CHEST WITHOUT CONTRAST TECHNIQUE: Multidetector CT imaging of the chest was performed following the standard protocol without IV contrast. COMPARISON:  02/19/2015 FINDINGS: Mediastinum/Nodes: The right-sided Port-A-Cath which terminates at the superior caval/ atrial junction. Tortuous descending thoracic aorta. Mild cardiomegaly. Multivessel coronary artery atherosclerosis. No mediastinal or definite hilar adenopathy, given limitations of unenhanced CT. Lungs/Pleura: Tiny right-sided pleural effusion. Right worse than left bibasilar and dependent upper lobe airspace and ground-glass opacity with mild septal thickening. Upper abdomen: Trace perihepatic ascites. Normal imaged portions of the spleen, stomach, adrenal glands, kidneys. The gallbladder is incompletely imaged but may be distended. This is similar to on the prior exam. No calcified stone. Subtle edema in the porta hepatis, including on image 56/series 2. Musculoskeletal: Remote left-sided rib fractures. Osteopenia. Advanced thoracolumbar spondylosis. Mild superior endplate compression deformity at T4 is chronic. Interval healing of previously described T10 compression deformity. A moderate T12 compression deformity is not significantly changed. IMPRESSION: 1. Bilateral airspace and ground-glass opacities, favor infection. Given dependent distribution, aspiration could look similar. 2.  Atherosclerosis, including within the coronary arteries. 3. Cardiomegaly with small right pleural effusion. 4. Nonspecific gallbladder distension. Suggestion of edema in the porta hepatis. Correlate with upper abdominal symptoms. Consider correlation with pancreatic enzyme levels  to exclude pancreatitis. Depending on symptomatology, abdominal ultrasound or CT may be informative. 5. Trace perihepatic ascites. Electronically Signed   By: Abigail Miyamoto M.D.   On: 12/06/2015 18:46   Nm Pulmonary Perf And Vent  12/07/2015  CLINICAL DATA:  Shortness of breath for 1 and half weeks. Lower extremity swelling. EXAM: NUCLEAR MEDICINE VENTILATION - PERFUSION LUNG SCAN TECHNIQUE: Ventilation images were obtained in multiple projections using inhaled aerosol Tc-31m DTPA. Perfusion images were obtained in multiple projections after intravenous injection of Tc-66m MAA. RADIOPHARMACEUTICALS:  31 millicurie [id number] DTPA aerosol inhalation and 0000000 millicurie [id number] MAA IV COMPARISON:  12/07/2015 FINDINGS: Ventilation: No focal ventilation defect. Central scratch set deposition of the radiopharmaceutical within the central airway is noted. Perfusion: No wedge shaped peripheral perfusion defects to suggest acute pulmonary embolism. IMPRESSION: Low probability for acute pulmonary embolus. Electronically Signed   By: Kerby Moors M.D.   On: 12/07/2015 15:38   US Renal Port  12/06/2015  CLINICAL DATA:  Acute renal failure EXAM: RENAL / URINARY TRACT ULTRASOUND COMPLETE COMPARISON:  02/19/2015 FINDINGS: Right Kidney: Length: 11.6 cm. Echogenicity within normal limits. No mass or hydronephrosis visualized. Left Kidney: Length: 10.9 cm. Echogenicity within normal limits. No mass or hydronephrosis visualized. Bladder: Limit assessment.  Under distended.  Grossly unremarkable. IMPRESSION: No acute finding by renal ultrasound. Electronically Signed   By: Jerilynn Mages.  Shick M.D.   On: 12/06/2015 21:13     Medical Consultants:    Oncology: Dr. Jana Hakim  Nephrology: Dr. Melvia Heaps  PCCM  Anti-Infectives:   Valcyte 12/27>>> Diflucan 12/15>>> Ganciclovir 12/21 - 12/27 Levaquin 12/20 - 12/26 Bactrim 12/17 - 12/20 Acyclovir 12/15 - 12/21  Subjective:   Timothy [name] feels better, more  energetic.  Appetite still fair.  Denies pain, N/V.  Objective:    Filed Vitals:   12/23/15 1400 12/23/15 2015 12/23/15 2050 12/24/15 0506  BP: 124/77 142/79 129/76 125/75  Pulse: 71 74 70 74  Temp: 97.7 F (36.5 C) 98.1 F (36.7 C) 97.6 F (36.4 C) 98.1 F (36.7 C)  TempSrc: Oral Oral Oral Oral  Resp: 16 16 18 18   Height:      Weight:    100.8 kg (222 lb 3.6 oz)  SpO2: 97% 99% 96% 94%    Intake/Output Summary (Last 24 hours) at 12/24/15 0730 Last data filed at 12/24/15 0507  Gross per 24 hour  Intake    450 ml  Output    250 ml  Net    200 ml   Filed Weights   12/22/15 0457 12/23/15 0607 12/24/15 0506  Weight: 99.7 kg (219 lb 12.8 oz) 100.1 kg (220 lb 10.9 oz) 100.8 kg (222 lb 3.6 oz)    Exam: Gen:  NAD Cardiovascular:  RRR, No M/R/G Respiratory:  Lungs CTAB Gastrointestinal:  Abdomen soft, NT/ND, + BS Extremities:  2+ edema   Data Reviewed:    Labs: Basic Metabolic Panel:  Recent Labs Lab 12/20/15 0425 12/20/15 0830 12/21/15 0513 12/22/15 0440 12/23/15 0428 12/24/15 0555  NA  --  145 145 145 144 146*  K  --  3.5 3.3* 3.8 3.7 4.0  CL  --  105 106 109 108 111  CO2  --  24 27 26 26 25   GLUCOSE  --  203* 200* 210* 230* 221*  BUN  --  128* 120* 105* 92* 87*  CREATININE  --  3.10* 2.70* 2.39* 2.23* 2.02*  CALCIUM  --  6.7* 7.0* 7.3* 7.3* 7.7*  MG 1.9  --  2.0 1.9 1.9 1.8  PHOS 4.1 4.1 3.7  3.6 3.1  3.0 3.3  3.2 3.3  3.4   GFR Estimated Creatinine Clearance: 39.1 mL/min (by C-G formula based on Cr of 2.02). Liver Function Tests:  Recent Labs Lab 12/18/15 0453 12/20/15 0830 12/21/15 0513 12/22/15 0440 12/23/15 0428 12/24/15 0555  AST 48*  --   --   --   --   --   ALT 35  --   --   --   --   --   ALKPHOS 91  --   --   --   --   --   BILITOT 1.3*  --   --   --   --   --   PROT 5.1*  --   --   --   --   --   ALBUMIN 2.5* 2.4* 2.4* 2.4* 2.3* 2.3*   CBC:  Recent Labs Lab 12/18/15 0453 12/19/15 0400 12/20/15 0425  WBC 4.4 4.6 5.3   HGB 11.1* 11.4* 11.6*  HCT 34.3* 35.9* 36.6*  MCV 90.5 90.9 91.0  PLT 115* 117* 126*    CBG:  Recent Labs Lab 12/22/15 2109 12/23/15 0725 12/23/15 1213 12/23/15 1640 12/23/15 2121  GLUCAP 166* 198* 234* 197* 208*   Microbiology No results found for this or any previous visit (from the past 240 hour(s)).   Medications:   . sodium chloride   Intravenous Once  . antiseptic oral rinse  7 mL Mouth Rinse BID  . calcitRIOL  0.25 mcg Oral Daily  . calcium gluconate  1 g Intravenous BID  . cholestyramine  4 g Oral BID  . diltiazem  240 mg Oral Daily  . feeding supplement (  GLUCERNA SHAKE)  237 mL Oral BID BM  . feeding supplement (NEPRO CARB STEADY)  237 mL Oral Q24H  . fluconazole  100 mg Oral Daily  . furosemide  80 mg Oral BID  . insulin aspart  0-20 Units Subcutaneous TID WC  . insulin aspart  0-5 Units Subcutaneous QHS  . insulin glargine  53 Units Subcutaneous Daily  . labetalol  300 mg Oral BID  . loratadine  10 mg Oral Daily  . pantoprazole  40 mg Oral Daily  . predniSONE  50 mg Oral BID WC  . sertraline  50 mg Oral q morning - 10a  . sodium bicarbonate  1,300 mg Oral BID  . sodium chloride  10-40 mL Intracatheter Q12H  . valGANciclovir  450 mg Oral Q24H   Continuous Infusions:   Time spent: 25 minutes.    LOS: 18 days   [place] [facility] Pager [phone]. If unable to reach me by pager, please call my cell phone at [phone].  *Please refer to [email or web address], password [email or web address] to get updated schedule on who will round on this patient, as hospitalists switch teams weekly. If 7PM-7AM, please contact night-coverage at [email or web address], password [email or web address] for any overnight needs.  12/24/2015, 7:30 AM

## 2015-12-24 NOTE — Clinical Social Work Placement (Signed)
CSW provided SNF bed offers to patient & wife, [name] at bedside. Currently patient has bed offers at [facility], [place], [place]. Wife requested to clear SNF decision with Dr. Jana Hakim before making a decision. Patient is leaning towards [facility] SNF (formerly [facility]).     Raynaldo Opitz, [facility] Clinical Social Worker cell #: [phone]    CLINICAL SOCIAL WORK PLACEMENT  NOTE  Date:  12/24/2015  Patient Details  Name: Timothy [name] MRN: [id number] Date of Birth: [birth year]  Clinical Social Work is seeking post-discharge placement for this patient at the [place] level of care (*CSW will initial, date and re-position this form in  chart as items are completed):  Yes   Patient/family provided with [place] Work Department's list of facilities offering this level of care within the geographic area requested by the patient (or if unable, by the patient's family).  Yes   Patient/family informed of their freedom to choose among providers that offer the needed level of care, that participate in Medicare, Medicaid or managed care program needed by the patient, have an available bed and are willing to accept the patient.  Yes   Patient/family informed of [place]'s ownership interest in [facility] and [facility], as well as of the fact that they are under no obligation to receive care at these facilities.  PASRR submitted to EDS on 12/24/15     PASRR number received on 12/24/15     Existing PASRR number confirmed on       FL2 transmitted to all facilities in geographic area requested by pt/family on 12/24/15     FL2 transmitted to all facilities within larger geographic area on       Patient informed that his/her managed care company has contracts with or will negotiate with certain facilities, including the following:        Yes   Patient/family  informed of bed offers received.  Patient chooses bed at       Physician recommends and patient chooses bed at      Patient to be transferred to   on  .  Patient to be transferred to facility by       Patient family notified on   of transfer.  Name of family member notified:        PHYSICIAN       Additional Comment:    _______________________________________________ Standley Brooking, LCSW 12/24/2015, 11:52 AM

## 2015-12-24 NOTE — Progress Notes (Signed)
Thank you for consult on Timothy [name]. Chart reviewed and therapy notes reviewed. Patient is severely deconditioned and has very poor activity tolerance. Agree with recommendations for SNF level therapies at discharge.

## 2015-12-25 ENCOUNTER — Encounter (HOSPITAL_COMMUNITY): Payer: Self-pay | Admitting: Radiology

## 2015-12-25 ENCOUNTER — Inpatient Hospital Stay (HOSPITAL_COMMUNITY): Payer: BC Managed Care – PPO

## 2015-12-25 DIAGNOSIS — R944 Abnormal results of kidney function studies: Secondary | ICD-10-CM

## 2015-12-25 LAB — GLUCOSE, CAPILLARY
GLUCOSE-CAPILLARY: 179 mg/dL — AB (ref 65–99)
Glucose-Capillary: 194 mg/dL — ABNORMAL HIGH (ref 65–99)
Glucose-Capillary: 189 mg/dL — ABNORMAL HIGH (ref 65–99)
Glucose-Capillary: 221 mg/dL — ABNORMAL HIGH (ref 65–99)

## 2015-12-25 LAB — RENAL FUNCTION PANEL
ALBUMIN: 2.3 g/dL — AB (ref 3.5–5.0)
ANION GAP: 9 (ref 5–15)
BUN: 84 mg/dL — ABNORMAL HIGH (ref 6–20)
CALCIUM: 7.9 mg/dL — AB (ref 8.9–10.3)
CO2: 24 mmol/L (ref 22–32)
CREATININE: 1.91 mg/dL — AB (ref 0.61–1.24)
Chloride: 110 mmol/L (ref 101–111)
GFR, EST AFRICAN AMERICAN: 40 mL/min — AB (ref >60)
GFR, EST NON AFRICAN AMERICAN: 34 mL/min — AB (ref >60)
Glucose, Bld: 201 mg/dL — ABNORMAL HIGH (ref 65–99)
PHOSPHORUS: 3.8 mg/dL (ref 2.5–4.6)
Potassium: 4.3 mmol/L (ref 3.5–5.1)
SODIUM: 143 mmol/L (ref 135–145)

## 2015-12-25 LAB — MAGNESIUM: Magnesium: 1.8 mg/dL (ref 1.7–2.4)

## 2015-12-25 MED ORDER — NEPRO/CARBSTEADY PO LIQD
237.0000 mL | Freq: Two times a day (BID) | ORAL | Status: DC
  Administered 2015-12-25 – 2015-12-28 (×4): 237 mL via ORAL
  Filled 2015-12-25 (×7): qty 237

## 2015-12-25 MED ORDER — LOPERAMIDE HCL 2 MG PO CAPS
2.0000 mg | ORAL_CAPSULE | ORAL | Status: DC | PRN
  Administered 2015-12-26 – 2015-12-28 (×2): 2 mg via ORAL
  Filled 2015-12-25 (×2): qty 1

## 2015-12-25 MED ORDER — GLUCERNA SHAKE PO LIQD
237.0000 mL | ORAL | Status: DC
  Administered 2015-12-26 – 2015-12-28 (×3): 237 mL via ORAL
  Filled 2015-12-25 (×3): qty 237

## 2015-12-25 NOTE — Progress Notes (Signed)
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    PT Comments    Slowly progressing with mobility. Continues to require +2 assist for mobility. Recommend SNF.   Follow Up Recommendations  SNF     Equipment Recommendations  None recommended by PT    Recommendations for Other Services OT consult     Precautions / Restrictions Precautions Precautions: Fall Restrictions Weight Bearing Restrictions: No    Mobility  Bed Mobility Overal bed mobility: Needs Assistance Bed Mobility: Supine to Sit;Sit to Supine;Rolling Rolling: Mod assist;+2 for physical assistance;+2 for safety/equipment   Supine to sit: Mod assist;+2 for physical assistance;+2 for safety/equipment;HOB elevated Sit to supine: Mod assist;+2 for physical assistance;+2 for safety/equipment   General bed mobility comments: Assist for trunk and bil LEs. Utilized bedpad for scooting, positioning.   Transfers                 General transfer comment: Did not occur, pt too fatigued/weak  Ambulation/Gait                 Stairs            Wheelchair Mobility    Modified Rankin (Stroke Patients Only)       Balance Overall balance assessment: Needs assistance Sitting-balance support: Bilateral upper extremity supported;Feet supported Sitting balance-Leahy Scale: Fair Sitting balance - Comments: Pt able to maintain dynamic sitting EOB (unsupported) for ~10 minutes during grooming tasks of washing face, brushing teeth, and combing hair. Pt required occasional min guard assist and cueing to lean  forward and not posteriorly.                             Cognition Arousal/Alertness: Awake/alert Behavior During Therapy: WFL for tasks assessed/performed Overall Cognitive Status: Within Functional Limits for tasks assessed                      Exercises      General Comments        Pertinent Vitals/Pain Pain Assessment: No/denies pain    Home Living                      Prior Function            PT Goals (current goals can now be found in the care plan section) Acute Rehab PT Goals Patient Stated Goal: to regain strength and independence  Progress towards PT goals: Progressing toward goals    Frequency  Min 3X/week    PT Plan Current plan remains appropriate    Co-evaluation   Reason for Co-Treatment: For patient/therapist safety   OT goals addressed during session: ADL's and self-care     End of Session   Activity Tolerance: Patient limited by fatigue Patient left: in bed;with call bell/phone within reach;with bed alarm set     Time: [id number] PT Time Calculation (min) (ACUTE ONLY): 22 min  Charges:  $Therapeutic Activity: 8-22 mins                    G Codes:  Weston Anna, MPT Pager: [phone]

## 2015-12-25 NOTE — Progress Notes (Signed)
Nutrition Follow-up  DOCUMENTATION CODES:   Not applicable  INTERVENTION:  - Glucerna Shake po Q24H, each supplement provides 220 kcal and 10 grams of protein - Nepro Shake po BID, each supplement provides 425 kcal and 19 grams protein - Encourage PO intakes with meals and supplements - RD will continue to monitor for needs   NUTRITION DIAGNOSIS:   Inadequate oral intake related to acute illness as evidenced by per patient/family report, percent weight loss.  ongoing  GOAL:   Patient will meet greater than or equal to 90% of their needs  Not yet met  MONITOR:   PO intake, Supplement acceptance, Weight trends, Labs, Skin, I & O's  REASON FOR ASSESSMENT:   Malnutrition Screening Tool    ASSESSMENT:   Timothy [name] return to the clinic today for a scheduled visit. He receives intravenous fluids and intravenous magnesium every 2 weeks. He has multiple chronic complaints, which have been generally stable Today however before the visit his wife Timothy [name] called to tell Timothy [name] that he had some difficulty breathing and a bit of slurred speech. He has had more diarrhea recently, not in terms of frequency but in terms of volume. He has lost quite a bit of fluid there. He has had no cough or fevers despite the shortness of breath. He continues to problems with swollen feet. His blood sugars have been running low, even though he has been using less insulin per her report.  1/3 Pt reports feeling better. Reports PO intake coming along, but still not feeling great. Per chart, PO averaging 31% over previous 6 meals.  Pt was drinking NePro during visit -- stated that the NePro shakes "Are the best tasting thing here." Decreased GS to Q24H, increased NePro to BID; Pt is on fluid restriction, NePro will provide more calories per/mL.   Reports some Nausea, but no Vomiting/Constipation, some diarrhea related to GVHD side effects.  Labs: CBGs 150-194, BUN 84, Cr 1.91, Ca 7.9, EGFR 34 - haven't heard  anything more about dialysis at this point. Pt has been offered multiple SNFs for discharge options per SW note.  Medications reviewed.   12/27 Pt sleeping at time of RD visit with no visitors present in the room at that time. No intakes documented since previous RD visit. Visualized 100% completion of juice and milk and 50% completion of Glucerna Shake. Breakfast tray on bedside table but is untouched at this time.   RD will continue to monitor for needs and intakes. It appears that pt has been drinking fluids well but may not be meeting needs with inadequate intake of solid foods. Medications reviewed. Labs reviewed; CBGs: 155-305 mg/dL, BUN/creatinine elevated and trending up, Ca: 6.4 mg/dL, Phos: 5.1 mg/dL, GFR: 16.  Per Nephrology note this AM, pt may require dialysis soon.    Diet Order:  Diet Carb Modified Fluid consistency:: Thin; Room service appropriate?: Yes; Fluid restriction:: 1200 mL Fluid Diet - low sodium heart healthy  Skin:  Reviewed, no issues  Last BM:  12/27  Height:   Ht Readings from Last 1 Encounters:  12/12/15 5\' 7"  (1.702 m)    Weight:   Wt Readings from Last 1 Encounters:  12/25/15 222 lb 14.2 oz (101.1 kg)    Ideal Body Weight:  64.54 kg  BMI:  Body mass index is 34.9 kg/(m^2).  Estimated Nutritional Needs:   Kcal:  1600-1800  Protein:  100-120 grams  Fluid:  >/= 1.9L  EDUCATION NEEDS:   No education needs identified at this  time  Timothy [name]. Timothy Antonio, MS, RD LDN After Hours/Weekend Pager [phone]

## 2015-12-25 NOTE — Progress Notes (Signed)
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   OT comments  Patient making slow, but steady progress towards OT goals. Pt able to tolerate sitting EOB for ~10 minutes for grooming tasks of washing face, brushing teeth, and combing hair. Pt required occasional min guard assist to maintain dynamic sitting balance and multimodal cueing to not lean posteriorly. Therapist encouraged patient to perform grooming tasks daily in bed if not working with therapist, in order to continue working on functional use and strengthening of BUEs. Discussed recommendation of having HOB raised during day with nursing to help increase patient's overall activity tolerance/endurance in order to functionally work with therapy.    Follow Up Recommendations  SNF;Supervision/Assistance - 24 hour    Equipment Recommendations  Other (comment) (TBD next venue of care)    Recommendations for Other Services  None at this time   Precautions / Restrictions Precautions Precautions: Fall Restrictions Weight Bearing Restrictions: No    Mobility Bed Mobility Overal bed mobility: Needs Assistance Bed Mobility: Rolling;Supine to Sit;Sit to Supine Rolling: Mod assist;+2 for physical assistance   Supine to sit: HOB elevated;Mod assist;+2 for physical assistance Sit to supine: Mod assist;+2 for physical assistance;HOB elevated   General bed mobility comments: Assist for BLE management and trunk control into sitting and supine. Multimodal cueing for safety, sequencing, and technique.   Transfers General transfer comment:  Did not occur, pt too fatigued    Balance Overall balance assessment: Needs assistance Sitting-balance support: No upper extremity supported;Feet supported Sitting balance-Leahy Scale: Fair (fair to poor) Sitting balance - Comments: Pt able to maintain dynamic sitting EOB (unsupported) for ~10 minutes during grooming tasks of washing face, brushing teeth, and combing hair. Pt required occasional min guard assist and cueing to lean forward and not posteriorly.    ADL Overall ADL's : Needs assistance/impaired     Grooming: Wash/dry face;Oral care;Brushing hair;Sitting;Min guard Grooming Details (indicate cue type and reason): sitting EOB unsupported, PT in back providing min guard prn  General ADL Comments: Pt very deconditioned.  Fatigues rapidly. Pt very motivated and willing to work with therapists. Pt engaged in bed mobility and sat EOB for grooming tasks, seated unsupported. Pt sat EOB ~10 minutes and with SOB and fatigue. Pt with incontinence of stool. Therapist doffed dressing due to dressing soiled, notified RN of this. Pt with small sacral opening/wound. With patient, dscussed importance of re-positioning in bed on right side and left side to prevent skin breakdown and keep skin integrity intact.     Cognition   Behavior During Therapy: WFL for tasks assessed/performed Overall Cognitive Status: Within Functional Limits for tasks assessed                 Pertinent Vitals/ Pain       Pain Assessment: No/denies pain   Frequency Min 2X/week     Progress Toward Goals  OT Goals(current goals can now bfound in the care plan section)  Progress towards OT goals: Progressing toward goals  Acute Rehab OT Goals Patient Stated Goal: to regain strength and independence   Plan Discharge plan remains appropriate    Co-evaluation    PT/OT/SLP  Co-Evaluation/Treatment: Yes Reason for Co-Treatment: For patient/therapist safety   OT goals addressed during session: ADL's and  self-care      End of Session   Activity Tolerance Patient tolerated treatment well;Patient limited by fatigue (only able to tolerate EOB activity)   Patient Left in bed;with call bell/phone within reach;with bed alarm set   Nurse Communication Other (comment) (Need for new dressing due to incontinent BM and recommendation for patient to sit with HOB elevated to help increase his activity tolerance/endurance for therapy)     Time: [id number] OT Time Calculation (min): 27 min  Charges: OT General Charges $OT Visit: 1 Procedure OT Treatments $Self Care/Home Management : 8-22 mins  Chrys Racer , MS, OTR/L, CLT Pager: [phone]  12/25/2015, 3:59 PM

## 2015-12-25 NOTE — Progress Notes (Signed)
Name: Timothy [name] MRN: [id number] DOB: 1946-04-23    ADMISSION DATE:  12/06/2015 CONSULTATION DATE:  12/07/15  REFERRING MD :  Dr. Vinie Sill   CHIEF COMPLAINT:  Basilar Infiltrates, Hypoxemic Respiratory Failure   BRIEF PATIENT DESCRIPTION:  70 y/o immunocompromised male with PMH of CKD, CLL s/p stem-cell transplant, chronic graft vs host disease admitted 12/15 from cancer center with dyspnea and diffuse bilateral infiltrates.    SIGNIFICANT EVENTS  12/15 - Admit from [place] with multiple c/o's - more diarrhea, SOB, slurred speech, low blood sugars 12/19 - FOB  MICROBIOLOGY:  CMV PCR 12/27>>> BAL 12/19:  normal flora  BAL AFB 12/19>>> smear NEG  BAL Fungus 12/19>>> smear NEG Legionella 12/19>>> PCP 12/19: NEG  CMV 12/19:CMV IgG positive but IgM negative FOB tissue culture 12/19: S bovis Stool C diff 12/17:  Negative  STUDIES:  CT Chest 12/15: bilateral airspace & ground glass opacities, atherosclerosis in coronary arteries, cardiomegaly, small R pleural effusion, non-specific gallbladder distention, trace perihepatic ascites  TTE 12/15: LV with mod to severe LVH, mild reduction of systolic fxn, EF [id number], No RWMA, grade 1 diastolic dysfunction, mild mitral regurg, mild aortic regurg VQ 12/16: LOW prob PE Port CXR 12/26:  Patchy bilateral opacities. Some improvement compared with prior CXR. No pleural effusion appreciated with low lung volumes.  ANTIBIOTICS: Valcyte 12/27>>> Diflucan 12/15>>> Ganciclovir 12/21 - 12/27 Levaquin 12/20 - 12/26 Bactrim 12/17 - 12/20 Acyclovir 12/15 - 12/21  SUBJECTIVE:  No distress. Feels almost completely at baseline  REVIEW OF SYSTEMS:  No chest pain or pressure. Reports nausea persists. Diarrhea persists as well without significant abdominal pain.Marland Kitchen  VITAL SIGNS: Temp:  [97.5 F (36.4 C)-97.8 F (36.6 C)] 97.5 F (36.4 C) (01/03 0422) Pulse Rate:  [71-73] 71 (01/03 0422) Resp:  [16-20] 16 (01/03 0422) BP:  (125-134)/(73-81) 134/81 mmHg (01/03 0422) SpO2:  [94 %-100 %] 99 % (01/03 0422) Weight:  [101.1 kg (222 lb 14.2 oz)] 101.1 kg (222 lb 14.2 oz) (01/03 0422) Room air  PHYSICAL EXAMINATION: General:  Awake. No distress. Alert.  Neuro:  Grossly nonfocal. No meningismus. Moving all 4 extremities equally. HEENT:  No oral ulcers. No scleral icterus. Moist mucous membranes. Cardiovascular: Regular rhythm. Minimal edema. Normal S1 & S2. Lungs:  Minimal basilar crackles. Normal respiratory effort on room air.    Abdomen:  Soft. Nontender. Nondistended. Integument:  Bruising of various ages. No rash on exposed skin. Warm & dry.   Recent Labs Lab 12/23/15 0428 12/24/15 0555 12/25/15 0415  NA 144 146* 143  K 3.7 4.0 4.3  CL 108 111 110  CO2 26 25 24   BUN 92* 87* 84*  CREATININE 2.23* 2.02* 1.91*  GLUCOSE 230* 221* 201*    Recent Labs Lab 12/19/15 0400 12/20/15 0425  HGB 11.4* 11.6*  HCT 35.9* 36.6*  WBC 4.6 5.3  PLT 117* 126*   No results found.  ASSESSMENT / PLAN:   70 y.o. WM s/p stem cell transplant on chronic immunosuppression. Likely has GVHD of the lungs given improvement on chest imaging & oxygen requirement, but we did treat w/ 7d abx given Strep bovis culture in lung. Patient remains on room air and is tolerating weaning of his steroids. Continues to have ongoing diarrhea. His ganciclovir was changed to valganciclovir on 12/27 by infectious diseases before signing off. The patient's renal function continues to improve. He will require close follow-up as we further taper his steroid regimen.  Acute hypoxic respiratory failure: Resolved. Likely secondary to graft-versus-host disease  of the lungs.  Plan -Continuing incentive spirometry while awake.  -Currently on day # 12 of steroids currently on Prednisone 50 mg po bid.  - repeat CT chest today - full pulmonary function testing on 1/4;  prior to beginning Prednisone taper.  HCAP Secondary to Strep bovis: Found on  bronchoscopy. Completed course of antibiotics.   Acute on chronic renal failure felt 2/2 tacrolimus: Slowly improving. Nephro signed off Plan Cont PRN lasix  CLL Post Transplant: Hematology & oncology following. Prograf at toxic level (12/26 24.4). Continuing prednisone 50 mg by mouth twice a day.  Plan Additional immunosuppression per their recommendations. F/u arranged w/ transplant at d/c by heme/onc   Disposition:  If the patient is discharged before Day #14 of steroids he should have followup on Day #14 with repeat CT Chest w/o contrast & full pulmonary function testing to guide our taper regimen which will occur over 3-6 months.  Erick Colace ACNP-BC [place] Pager # [phone] OR # [phone] if no answer  12/25/2015, 1:23 PM

## 2015-12-25 NOTE — Care Management Note (Signed)
Case Management Note  Patient Details  Name: Timothy [name] MRN: [id number] Date of Birth: July 13, 1946  Subjective/Objective:                    Action/Plan:d/c SNF   Expected Discharge Date:   (unknown)               Expected Discharge Plan:  [place]  In-House Referral:     Discharge planning Services  CM Consult  Post Acute Care Choice:    Choice offered to:     DME Arranged:    DME Agency:     HH Arranged:    [place] Agency:     Status of Service:  Completed, signed off  Medicare Important Message Given:    Date Medicare IM Given:    Medicare IM give by:    Date Additional Medicare IM Given:    Additional Medicare Important Message give by:     If discussed at [place] of Stay Meetings, dates discussed:    Additional Comments:  Dessa Phi, RN 12/25/2015, 3:24 PM

## 2015-12-25 NOTE — Progress Notes (Signed)
Timothy [name]   DOB:04-13-1946   ZS#:[id number]   DDU#:[id number]  Subjective: Dat feels stronger, but not getting OOB--has not seen PT, he says. Increased frequency BMs. No pain, no SOB/cough, phlegm. Concerned re dischargeplans  Objective: middle aged White male examined in bed  Filed Vitals:   12/24/15 2155 12/25/15 0422  BP: 125/77 134/81  Pulse: 73 71  Temp: 97.8 F (36.6 C) 97.5 F (36.4 C)  Resp: 18 16    Body mass index is 34.9 kg/(m^2).  Intake/Output Summary (Last 24 hours) at 12/25/15 0758 Last data filed at 12/24/15 1120  Gross per 24 hour  Intake    340 ml  Output      0 ml  Net    340 ml   Exam not repeated   CBG (last 3)   Recent Labs  12/24/15 1654 12/24/15 2150 12/25/15 0737  GLUCAP 167* 150* 179*     Labs:  Lab Results  Component Value Date   WBC 5.3 12/20/2015   HGB 11.6* 12/20/2015   HCT 36.6* 12/20/2015   MCV 91.0 12/20/2015   PLT 126* 12/20/2015   NEUTROABS 4.4 12/06/2015    '@LASTCHEMISTRY'$ @  Urine Studies No results for input(s): UHGB, CRYS in the last 72 hours.  Invalid input(s): UACOL, UAPR, USPG, UPH, UTP, UGL, [place], UBIL, UNIT, [place], [place]  Basic Metabolic Panel:  Recent Labs Lab 12/21/15 0513 12/22/15 0440 12/23/15 0428 12/24/15 0555 12/25/15 0415  NA 145 145 144 146* 143  K 3.3* 3.8 3.7 4.0 4.3  CL 106 109 108 111 110  CO2 '27 26 26 25 24  '$ GLUCOSE 200* 210* 230* 221* 201*  BUN 120* 105* 92* 87* 84*  CREATININE 2.70* 2.39* 2.23* 2.02* 1.91*  CALCIUM 7.0* 7.3* 7.3* 7.7* 7.9*  MG 2.0 1.9 1.9 1.8 1.8  PHOS 3.7  3.6 3.1  3.0 3.3  3.2 3.3  3.4 3.8   GFR Estimated Creatinine Clearance: 41.4 mL/min (by C-G formula based on Cr of 1.91). Liver Function Tests:  Recent Labs Lab 12/21/15 0513 12/22/15 0440 12/23/15 0428 12/24/15 0555 12/25/15 0415  ALBUMIN 2.4* 2.4* 2.3* 2.3* 2.3*   No results for input(s): LIPASE, AMYLASE in the last 168 hours. No results for input(s):  AMMONIA in the last 168 hours. Coagulation profile No results for input(s): INR, PROTIME in the last 168 hours.  CBC:  Recent Labs Lab 12/19/15 0400 12/20/15 0425  WBC 4.6 5.3  HGB 11.4* 11.6*  HCT 35.9* 36.6*  MCV 90.9 91.0  PLT 117* 126*   Cardiac Enzymes: No results for input(s): CKTOTAL, CKMB, CKMBINDEX, TROPONINI in the last 168 hours. BNP: Invalid input(s): POCBNP CBG:  Recent Labs Lab 12/24/15 0737 12/24/15 1154 12/24/15 1654 12/24/15 2150 12/25/15 0737  GLUCAP 200* 200* 167* 150* 179*   D-Dimer No results for input(s): DDIMER in the last 72 hours. Hgb A1c No results for input(s): HGBA1C in the last 72 hours. Lipid Profile No results for input(s): CHOL, HDL, LDLCALC, TRIG, CHOLHDL, LDLDIRECT in the last 72 hours. Thyroid function studies No results for input(s): TSH, T4TOTAL, T3FREE, THYROIDAB in the last 72 hours.  Invalid input(s): FREET3 Anemia work up No results for input(s): VITAMINB12, FOLATE, FERRITIN, TIBC, IRON, RETICCTPCT in the last 72 hours. Microbiology No results found for this or any previous visit (from the past 240 hour(s)).  Studies:  Dg Chest 2 View  12/11/2015  CLINICAL DATA:  Weakness and shortness of breath today. History of pneumonia and  leukemia. EXAM: CHEST  2 VIEW COMPARISON:  12/10/2015 FINDINGS: Right IJ Port-A-Cath unchanged. Lungs are somewhat hypoinflated and demonstrate continued airspace opacification over the right lobe and posterior lower lobes with slight interval improvement. Stable hazy opacification over the left mid upper lung. Small amount of bilateral pleural fluid posteriorly. Stable cardiomegaly. Remainder the exam is unchanged. IMPRESSION: Hypoinflation with multifocal airspace process with slight interval improvement likely multifocal pneumonia. Small amount of posterior pleural fluid. Right IJ Port-A-Cath unchanged. Electronically Signed   By: Marin Olp M.D.   On: 12/11/2015 11:51   Dg Chest 2  View  12/10/2015  CLINICAL DATA:  Shortness of breath. History of chronic lymphocytic leukemia EXAM: CHEST  2 VIEW COMPARISON:  Study obtained earlier in the day and chest CT December 06, 2015 FINDINGS: There is extensive airspace consolidation in the right lower lobe. There is more patchy infiltrate throughout much of the left upper lobe and to a lesser extent right upper lobe, stable. Compared to earlier in the day, there is new consolidation lateral left base. Heart is upper normal in size with pulmonary vascular within normal limits. Port-A-Cath tip is in the superior vena cava. No adenopathy appreciable. Multiple compression fractures in the thoracic spine appear stable. IMPRESSION: Multifocal pneumonia with new infiltrate in the lateral left base compared to earlier in the day. Other areas of infiltrate appears stable. No change in cardiac silhouette. Electronically Signed   By: Lowella Grip III M.D.   On: 12/10/2015 20:29   Dg Chest 2 View  12/08/2015  CLINICAL DATA:  70 year old male with pneumonia and shortness of breath. Recent bone marrow transplant. EXAM: CHEST  2 VIEW COMPARISON:  12/07/2015 and prior radiograph FINDINGS: The cardiomediastinal silhouette is unchanged. A right Port-A-Cath with tip overlying the superior cavoatrial junction again noted. Increasing right upper lung airspace disease/pneumonia is noted. Bilateral lower lung airspace disease is unchanged. No large pleural effusions or pneumothorax noted. IMPRESSION: Increasing right upper lung airspace disease/pneumonia. Unchanged bilateral lower lung airspace disease. Electronically Signed   By: Margarette Canada M.D.   On: 12/08/2015 11:39   Dg Chest 2 View  12/07/2015  CLINICAL DATA:  Shortness of breath, pre VQ scan. EXAM: CHEST  2 VIEW COMPARISON:  Chest CT 12/06/2015 FINDINGS: Focal consolidation noted in the right lung base concerning for pneumonia, this is similar to prior CT. Minimal left basilar density, also similar. Heart  is mildly enlarged. No effusions. No acute bony abnormality. IMPRESSION: Stable appearance of the bilateral lower lobe airspace opacities, right greater than left concerning for pneumonia. Electronically Signed   By: Rolm Baptise M.D.   On: 12/07/2015 12:25   Ct Chest Wo Contrast  12/06/2015  CLINICAL DATA:  Shortness of breath. Kidney failure. Hypocalcemia. Hypoglycemia. History of leukemia, status post bone marrow transplant in 2013. EXAM: CT CHEST WITHOUT CONTRAST TECHNIQUE: Multidetector CT imaging of the chest was performed following the standard protocol without IV contrast. COMPARISON:  02/19/2015 FINDINGS: Mediastinum/Nodes: The right-sided Port-A-Cath which terminates at the superior caval/ atrial junction. Tortuous descending thoracic aorta. Mild cardiomegaly. Multivessel coronary artery atherosclerosis. No mediastinal or definite hilar adenopathy, given limitations of unenhanced CT. Lungs/Pleura: Tiny right-sided pleural effusion. Right worse than left bibasilar and dependent upper lobe airspace and ground-glass opacity with mild septal thickening. Upper abdomen: Trace perihepatic ascites. Normal imaged portions of the spleen, stomach, adrenal glands, kidneys. The gallbladder is incompletely imaged but may be distended. This is similar to on the prior exam. No calcified stone. Subtle edema in the porta  hepatis, including on image 56/series 2. Musculoskeletal: Remote left-sided rib fractures. Osteopenia. Advanced thoracolumbar spondylosis. Mild superior endplate compression deformity at T4 is chronic. Interval healing of previously described T10 compression deformity. A moderate T12 compression deformity is not significantly changed. IMPRESSION: 1. Bilateral airspace and ground-glass opacities, favor infection. Given dependent distribution, aspiration could look similar. 2.  Atherosclerosis, including within the coronary arteries. 3. Cardiomegaly with small right pleural effusion. 4. Nonspecific  gallbladder distension. Suggestion of edema in the porta hepatis. Correlate with upper abdominal symptoms. Consider correlation with pancreatic enzyme levels to exclude pancreatitis. Depending on symptomatology, abdominal ultrasound or CT may be informative. 5. Trace perihepatic ascites. Electronically Signed   By: Abigail Miyamoto M.D.   On: 12/06/2015 18:46   Nm Pulmonary Perf And Vent  12/07/2015  CLINICAL DATA:  Shortness of breath for 1 and half weeks. Lower extremity swelling. EXAM: NUCLEAR MEDICINE VENTILATION - PERFUSION LUNG SCAN TECHNIQUE: Ventilation images were obtained in multiple projections using inhaled aerosol Tc-57mDTPA. Perfusion images were obtained in multiple projections after intravenous injection of Tc-948mAA. RADIOPHARMACEUTICALS:  31 millicurie [id number] aerosol inhalation and 4.1.88illicurie [id number] IV COMPARISON:  12/07/2015 FINDINGS: Ventilation: No focal ventilation defect. Central scratch set deposition of the radiopharmaceutical within the central airway is noted. Perfusion: No wedge shaped peripheral perfusion defects to suggest acute pulmonary embolism. IMPRESSION: Low probability for acute pulmonary embolus. Electronically Signed   By: TaKerby Moors.D.   On: 12/07/2015 15:38   Us[place]oreaenal Port  12/06/2015  CLINICAL DATA:  Acute renal failure EXAM: RENAL / URINARY TRACT ULTRASOUND COMPLETE COMPARISON:  02/19/2015 FINDINGS: Right Kidney: Length: 11.6 cm. Echogenicity within normal limits. No mass or hydronephrosis visualized. Left Kidney: Length: 10.9 cm. Echogenicity within normal limits. No mass or hydronephrosis visualized. Bladder: Limit assessment.  Under distended.  Grossly unremarkable. IMPRESSION: No acute finding by renal ultrasound. Electronically Signed   By: M.Jerilynn Mages Shick M.D.   On: 12/06/2015 21:13   Dg Chest Port 1 View  12/17/2015  CLINICAL DATA:  Acute respiratory failure EXAM: PORTABLE CHEST 1 VIEW COMPARISON:  12/16/2015 and 12/ 23/16  FINDINGS: Cardiomediastinal silhouette is stable. There is poor inspiration with mild basilar atelectasis. No segmental infiltrate. Right IJ Port-A-Cath is unchanged in position. Residual streaky mild edema or pneumonitis in lingula. Minimal central vascular congestion without global pulmonary edema IMPRESSION: There is poor inspiration with mild basilar atelectasis. No segmental infiltrate. Right IJ Port-A-Cath is unchanged in position. Residual streaky mild edema or pneumonitis in lingula. Electronically Signed   By: LiLahoma Crocker.D.   On: 12/17/2015 09:47   Dg Chest Port 1 View  12/16/2015  CLINICAL DATA:  Acute respiratory failure EXAM: PORTABLE CHEST 1 VIEW COMPARISON:  12/14/2015 FINDINGS: Right chest wall port a catheter is noted with tip in the cavoatrial junction. Moderate cardiac enlargement. Decreased lung volumes. Pulmonary edema pattern is stable to improved in the interval. IMPRESSION: 1. Stable to improved appearance of pulmonary edema pattern. Electronically Signed   By: TaKerby Moors.D.   On: 12/16/2015 09:22   Dg Chest Port 1 View  12/14/2015  CLINICAL DATA:  Respiratory failure. EXAM: PORTABLE CHEST - 1 VIEW COMPARISON:  One-view chest x-ray 12/13/2015. FINDINGS: The heart size is normal. The lung volumes remain low. A diffuse interstitial pattern is slightly increased compared to the prior study. Bibasilar airspace disease is evident. A right IJ Port-A-Cath is stable. IMPRESSION: Slight increase in diffuse interstitial and airspace pattern compatible with edema and pneumonia. Electronically Signed  By: San Morelle M.D.   On: 12/14/2015 07:31   Dg Chest Port 1 View  12/13/2015  CLINICAL DATA:  70 year old male with shortness of breath, respiratory failure, pneumonia. Initial encounter. EXAM: PORTABLE CHEST 1 VIEW COMPARISON:  12/12/2015 and earlier. FINDINGS: Portable AP semi upright view at 0440 hours. Stable right chest porta cath. Stable lung volumes. Stable cardiac size  and mediastinal contours. No pneumothorax or pleural effusion. Coarse and patchy bilateral perihilar opacity is stable since yesterday, mildly regressed in the right lower lung since 12/11/2015. No areas of worsening ventilation. IMPRESSION: Bilateral multifocal pneumonia stable since yesterday. Right lower lung ventilation mildly improved since 12/11/2015. Electronically Signed   By: Genevie Ann M.D.   On: 12/13/2015 07:15   Dg Chest Port 1 View  12/12/2015  CLINICAL DATA:  Shortness of breath. Weakness. History of pneumonia and the [place]. EXAM: PORTABLE CHEST 1 VIEW COMPARISON:  12/11/2015 and previous FINDINGS: Pulmonary infiltrate in the right lower lobe is improving. However, there is worsened interstitial density throughout the lungs in general, particularly the upper lobes. This could go along with developing edema or spreading of the pneumonia. No visible effusion. Port-A-Cath on the right is unchanged. IMPRESSION: Continued improvement of the right lower lobe infiltrate. Worsening of generalized interstitial lung density, upper lobe predominant. Differential diagnosis is developing interstitial edema/ fluid overload versus generalized worsening of pneumonia. Electronically Signed   By: Nelson Chimes M.D.   On: 12/12/2015 08:20   Dg Chest Port 1 View  12/10/2015  CLINICAL DATA:  70 year old male post bronchoscopy right middle lobe. Subsequent encounter. EXAM: PORTABLE CHEST 1 VIEW COMPARISON:  12/08/2015. FINDINGS: Curvilinear structure right lung apex probably pleural reflection associated with rib rather than tiny pneumothorax. Attention to this on follow up. Asymmetric airspace disease greater on the right. When compared to the most recent chest x-ray, decrease in degree of consolidation right lung apex and slight increase in left perihilar consolidation. Pulmonary vascular congestion. Right MediPort catheter tip mid superior vena cava level. Heart size top-normal to slightly enlarged. IMPRESSION:  Curvilinear structure right lung apex probably pleural reflection associated with rib rather than tiny pneumothorax. Attention to this on follow up. Asymmetric airspace disease greater on the right. When compared to the most recent chest x-ray, decrease in degree of consolidation right lung apex and slight increase in left perihilar consolidation. Pulmonary vascular congestion. Electronically Signed   By: Genia Del M.D.   On: 12/10/2015 10:26   Dg C-arm Bronchoscopy  12/10/2015  CLINICAL DATA:  C-ARM BRONCHOSCOPY Fluoroscopy was utilized by the requesting physician.  No radiographic interpretation.    Assessment: 70 y.o. [place] man with a history of well-differentiated lymphocytic lymphoma/ chronic lymphoid leukemia initially diagnosed in 2000, not requiring intervention until 2006; s/p allogeneic transplant March 2013, now admitted with worsening SOB, bilateral pulmonary infiltrates and acute on chronic kidney injury, but no fever, mucositis, or rash. His CLL history is as follows:  (1) fludarabine/cyclophosphamide/rituximab x5 completed May 2007.   (2) rituximab for 8 doses October 2010, with partial response   (3) Leustatin and ofatumumab weekly x8 July to September 2011 followed by maintenance ofatumumab every 2 months, with initial response but rising counts September 2012   (4) status-post unrelated donor stem-cell transplant 02/24/2012 at [facility]  (a) conditioning regimen consisted of fludarabine + TBI at 200 cGy, followed by rituximab x27;  (b) CMV reactivation x3 (patient CMV positive, donor negative), s/p ganciclovir treatment; 3d reactivation August 2013, s/p gancyclovir, with negative PCR mid-September 2013;  last gancyclovir dose 10/06/2012 (c) Chronic GVHD: involving gut and skin, treated with steroids, tacrolimus and MMF. MMF was eventually d/c'd and tacrolimus currently at a dose of 1.'5mg'$  BID (d) atrial fibrillation: resolved on brief amiodarone regimen (e)  steroid-induced myopathy: improving  (f) hypomagnesemia: improved after d/c gancyclovir, needs continuing support (g) hypogammaglobulinemia: requiring IVIG most recently 08/03/2014. (h) history of elevated triglycerides (606 on 07/14/2012)  (i) adrenal insufficiency: on prednisone and budesonide (j) pancytopenia,resolved (k) brief episode of neutropenia ([place] 300) February 2015, accompanied by diarrhea; resolved   (5) restaging studies February-March 2015 including CT scans, flow cytometry, and bone marrow biopsy, showed no evidence of residual chronic lymphoid leukemia. (a) repeat bone marrow biopsy 02/27/2015 showed no evidence of chronic lymphoid leukemia and also no dyspoiesis. Flow cytometry showed no B cells.  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 Status post kyphoplasty at L3-4 in June 2014. Also with evidence of rib fractures and insufficiency fractures bilaterally of the sacral alae, noted by CT in March 2015. -- Denosumab started 12/08/2013, given as prolia Q6 months which is what has been approved by his insurance, most recent dose 03/29/2015  (12) chronic back pain and hip pain controlled with OxyContin and hydrocodone/APAP.  (12) nausea: well controlled on current meds  (13) Positive c.diff, 03/08/2013, on Flagyl 500 mg TID x 20 days, then on oral vanco with Questran, showing improvement; positive when repeated April 2014; Negative x 3 since then; repeat 12/07/2015  again negative  (14) persistently increased BUN and potassium: Improved offtacrolimus  (15) Hypertension, on labetalol, cardizem, lisinopril, and furosemide; managed by Dr. Brigitte Pulse  (16) steroid induced hyperglycemia/ DM II: managed by Dr Brigitte Pulse; was severely hypoglycemic on admission, currently on SSI   (17) hypogammaglobulinemia-- requiring intermittent supplementation, most recent dose 10/18/2015  (18) squamous cell CA in situ removed from left parietal scalp October 2014  (19) severe SOB with pulmonary infiltrates: workup so far has included   (a) echo 12/06/2015 shows EF 45-50% and no wall motion abnormalities  (b) EKG shows new (c/w 2014) left fascicular block, old RBBB, no ischemic changes  (c) troponin is minimally and persistently elevated, not c/w MI  (d) V/Q scan 12/07/2015 low probability  (e) negative influenza panel  (f) .CMV IgG positive but IgM negative, quant DNA neg/low  (g) PCP screen from BAL negative  (h) IgG level <400-- replaced 12/11/2015  (20) severe deconditioning    Plan:  Kenlee continues to improve, with creatinine now <2.0. Sugars remain highHe is looking at SNF choices and is appropriately concerned re money issues, which SW should help him clarify. Hoping to have some PT today.  I will arrange for visit with me JAN 13 and with Dr Nadara Mustard (transplant group at [facility]) the following week.  Greatly appreciate your help to this complex patient!  Chauncey Cruel, MD 12/25/2015  7:58 AM Medical Oncology and Hematology [facility] [address] [place], [place] [address] Tel. [phone]    Fax. [phone]

## 2015-12-25 NOTE — Progress Notes (Signed)
PT Cancellation Note  Patient Details Name: Timothy [name] MRN: [id number] DOB: [birth year]   Cancelled Treatment:    Reason Eval/Treat Not Completed: Patient at procedure or test/unavailable. Will check back another time.    Weston Anna, MPT Pager: [phone]

## 2015-12-26 ENCOUNTER — Telehealth: Payer: Self-pay | Admitting: *Deleted

## 2015-12-26 ENCOUNTER — Ambulatory Visit: Payer: Self-pay

## 2015-12-26 ENCOUNTER — Other Ambulatory Visit: Payer: Self-pay

## 2015-12-26 ENCOUNTER — Ambulatory Visit: Payer: Self-pay | Admitting: Nurse Practitioner

## 2015-12-26 DIAGNOSIS — R918 Other nonspecific abnormal finding of lung field: Secondary | ICD-10-CM | POA: Insufficient documentation

## 2015-12-26 DIAGNOSIS — N184 Chronic kidney disease, stage 4 (severe): Secondary | ICD-10-CM

## 2015-12-26 LAB — GLUCOSE, CAPILLARY
GLUCOSE-CAPILLARY: 197 mg/dL — AB (ref 65–99)
GLUCOSE-CAPILLARY: 202 mg/dL — AB (ref 65–99)
Glucose-Capillary: 194 mg/dL — ABNORMAL HIGH (ref 65–99)
Glucose-Capillary: 157 mg/dL — ABNORMAL HIGH (ref 65–99)

## 2015-12-26 LAB — RENAL FUNCTION PANEL
ANION GAP: 11 (ref 5–15)
Albumin: 2.3 g/dL — ABNORMAL LOW (ref 3.5–5.0)
BUN: 78 mg/dL — ABNORMAL HIGH (ref 6–20)
CHLORIDE: 110 mmol/L (ref 101–111)
CO2: 24 mmol/L (ref 22–32)
Calcium: 8 mg/dL — ABNORMAL LOW (ref 8.9–10.3)
Creatinine, Ser: 1.77 mg/dL — ABNORMAL HIGH (ref 0.61–1.24)
GFR calc Af Amer: 43 mL/min — ABNORMAL LOW (ref >60)
GFR calc non Af Amer: 37 mL/min — ABNORMAL LOW (ref >60)
GLUCOSE: 192 mg/dL — AB (ref 65–99)
PHOSPHORUS: 3.7 mg/dL (ref 2.5–4.6)
POTASSIUM: 4.4 mmol/L (ref 3.5–5.1)
Sodium: 145 mmol/L (ref 135–145)

## 2015-12-26 LAB — PHOSPHORUS: Phosphorus: 3.7 mg/dL (ref 2.5–4.6)

## 2015-12-26 LAB — MAGNESIUM: Magnesium: 1.7 mg/dL (ref 1.7–2.4)

## 2015-12-26 LAB — TACROLIMUS LEVEL: TACROLIMUS (FK506) - LABCORP: 5.3 ng/mL (ref 2.0–20.0)

## 2015-12-26 MED ORDER — PREDNISONE 50 MG PO TABS
50.0000 mg | ORAL_TABLET | Freq: Every day | ORAL | Status: DC
  Administered 2015-12-27 – 2015-12-28 (×2): 50 mg via ORAL
  Filled 2015-12-26 (×2): qty 1

## 2015-12-26 NOTE — Progress Notes (Signed)
Primary Care Courtesy Visit- I greatly appreciate excellent care provided by [facility], Dr. Jana Hakim, [place], Nephrology, and others during his complex, prolonged hospitalization.  Course reviewed through EPIC daily and with patient at bedside this morning.  He remains in good spirits awaiting SNF bed availability for rehab.  Significant improvement in pulmonary status since admission with antibiotics and steroids for GVHD.  Renal function stabilized after discontinuation of Prograf.  Hyperglycemia secondary to steroids improving with adjustments in insulin.  Remains profoundly deconditioned.  Will need follow-up with Pulmonology for PFTs/CT and steroid taper as well as Oncology (plans to see Dr. Nadara Mustard at [facility]).  I will see him within 1 week of discharge from [place] for [facility].  Medical care during SNF stay to be provided by facility MD.  Patient has excellent understanding of his hospital course and plan.  Thanks to the treatment teams for their care.

## 2015-12-26 NOTE — Telephone Encounter (Signed)
Per walk in to lobby this RN spoke with [name] per her concerns regarding Aime's d/c to rehab facility.  Per [name]- the 3 facilities given to her by SW - care manager have been visited and [name] has multiple concerns - with primary concern of Josel being exposed to more infections.  [name] would prefer if above is expected locations for d/c to bring [place] home with home health and PT. [name] is willing to hire caregivers if required for best outcome with Kourtney's health. Presently Paula's dtr and husband are with her and stating they can assist.  This RN inform [name] to discuss all the above with SW and care manager including home health request.  Note [name] was distressed over facilities offered and putting [place] in a location that does not seem best for him per her evaluation.  This note will be given to MD.  Return call for [facility] for this afternoon givens as [phone]

## 2015-12-26 NOTE — Care Management Note (Signed)
Case Management Note  Patient Details  Name: Timothy [name] MRN: [id number] Date of Birth: 1946-06-27  Subjective/Objective: Spoke to spouse about d/c plans. She wants home w/HHC. [facility] rep aware of referral. HHRN/HHPT/HH Nurse's aide/social worker;DME:hospital bed,gel mattress,hoyer lift,3n1,w/c. Await HHC/DME orders.Ambulance transp needed @ d/c.Spoke to [facility] Dr. Virgie Dad nurse per spouse request-she agreed to plan.Will also leave Priv[employer] duty nurse list in rm(informed spouse of out of pocket expense)Spouse voiced understanding.MD notified.                 Action/Plan:d/c home w/HHC/dme   Expected Discharge Date:   (unknown)               Expected Discharge Plan:  [name]  In-House Referral:     Discharge planning Services  CM Consult  Post Acute Care Choice:    Choice offered to:  Spouse  DME Arranged:    DME Agency:  [place] Arranged:    [place] Agency:  [place]  Status of Service:  Completed, signed off  Medicare Important Message Given:    Date Medicare IM Given:    Medicare IM give by:    Date Additional Medicare IM Given:    Additional Medicare Important Message give by:     If discussed at [place] of Stay Meetings, dates discussed:    Additional Comments:  Dessa Phi, RN 12/26/2015, 3:55 PM

## 2015-12-26 NOTE — Progress Notes (Signed)
Patient ID: Timothy [name], male   DOB: 07-17-1946, 70 y.o.   MRN: [id number] [name] PROGRESS NOTE  Timothy [name] [id number] DOB: 11-18-1946 DOA: 12/06/2015 PCP: Marton Redwood, MD  Brief narrative:    70 y.o. 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 Pt was under PCCM care and then transitioned to [facility] 12/19/2015. In short, initial hypoxia and shortness of breath thought to be due to GVHD. Pt is s/p bronch with BAL brushing and transbronchial biopsies 12/10/15. He was treated with abx for 7 days for strep bovis culture in the lung. He is being followed by his oncologist Dr. Jana Hakim, infectious disease and nephrology. Patient's creatinine was as high as 3.7 on the admission (and his baseline around 2.6 in 09/2015) and this acute on chronic renal failure was thought to be due to prograf which has been on hold since. He continues to have diarrhea but his C.diff was negative 12/08/2015. Additionally, CMV 12/10/15 showed CMV IgG positive but IgM negative. He was on valaganciclovir  through 12/25/2015.    Assessment/Plan:    Acute respiratory failure with hypoxemia/HCAP/S. Bovis lung infection in an immunocompromised host. - Likely due to S. Bovis in the setting of immunocompromise and GVHD. S/P Bronchoscopy/BAL. - Resolved.  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  - Continue prednisone 50 mg BID through 01/05 and then needs repeat CT scan at which time pulmonary to decide on prednisone taper.  Deconditioning/weakness - Evaluated by physical therapist 12/22/15. SNF recommended. Appreciate social work assistance with placement.  Diarrhea - C.diff was negative. - Diarrhea improved  CMV IgG - Valcytel completed 12/25/2015.  Acute renal failrure superimposed on CKD stage 4 /  hypocalcemia - Baseline Cr in 09/2015 was 2.6 and on this admission 3.7, now improved to 2.02. - Likely from prograf. Prograf given toxic level (12/26 24.4) and has been stopped. - Nephrology has seen the patient in consultation and recommended to hold Prograf - Continue sodium bicarbonate and Calcitrol   CLL  - Post Transplant. - Continuing prednisone 50 mg by mouth twice a day as mentioned  Depression - Continue Zoloft.   Uncontrolled diabetes mellitus with diabetic nephropathy with long term insulin use - Currently being managed with Lantus 53 units daily and sliding scale insulin  Essential hypertension - Continue labetalol 300 mg twice daily and Cardizem 240 mg daily.   Atrial fibrillation - CHADS vasc score at least 3. - Rate controlled with Cardizem.  - Not on AC due to thrombocytopenia.  Anemia of chronic disease / Thrombocytopenia - Due to history of CLL.  DVT Prophylaxis  - SCD's bilaterally    Code Status: Full.  Family Communication:  plan of care discussed with the patient and his wife at the bedside Disposition Plan: to SNF once bed available   IV access:  Peripheral IV  Procedures and diagnostic studies:    Dg Chest 2 View 12/07/2015 Stable appearance of the bilateral lower lobe airspace opacities, right greater than left concerning for pneumonia. Electronically Signed By: Rolm Baptise M.D. On: 12/07/2015 12:25   Ct Chest Wo Contrast 12/06/2015  1. Bilateral airspace and ground-glass opacities, favor infection. Given dependent distribution, aspiration could look similar. 2. Atherosclerosis, including within the coronary arteries. 3. Cardiomegaly with small right pleural effusion. 4. Nonspecific gallbladder distension. Suggestion of edema in the porta hepatis. Correlate with  upper abdominal symptoms. Consider correlation with pancreatic enzyme levels to exclude pancreatitis. Depending on symptomatology, abdominal ultrasound or CT may be informative. 5.  Trace perihepatic ascites. Electronically Signed By: Abigail Miyamoto M.D. On: 12/06/2015 18:46   Nm Pulmonary Perf And Vent 12/07/2015  Low probability for acute pulmonary embolus. Electronically Signed By: Kerby Moors M.D. On: 12/07/2015 15:38   US Renal Port 12/06/2015  No acute finding by renal ultrasound. Electronically Signed By: Jerilynn Mages. Shick M.D. On: 12/06/2015 21:13    Medical Consultants:  Oncology: Dr. Jana Hakim Nephrology: Dr. Melvia Heaps PCCM  IAnti-Infectives:   Diflucan 12/15>>> Valcyte 12/27>>>1/3 Ganciclovir 12/21 - 12/27 Levaquin 12/20 - 12/26 Bactrim 12/17 - 12/20 Acyclovir 12/15 - 12/21   Leisa Lenz, MD  [name] Pager [phone]  Time spent in minutes: 25 minutes  If 7PM-7AM, please contact night-coverage [email or web address] Password [email or web address] 12/26/2015, 1:21 PM   LOS: 20 days    HPI/Subjective: No acute overnight events. Patient reports feeling tired.  Objective: Filed Vitals:   12/25/15 0422 12/25/15 1410 12/25/15 2125 12/26/15 0529  BP: 134/81 144/89 127/76 132/68  Pulse: 71 76 76 76  Temp: 97.5 F (36.4 C) 98.1 F (36.7 C) 97.6 F (36.4 C) 98 F (36.7 C)  TempSrc: Oral Oral Oral Oral  Resp: 16 16 18 18   Height:      Weight: 222 lb 14.2 oz (101.1 kg)   221 lb 12.5 oz (100.6 kg)  SpO2: 99% 95% 95% 95%    Intake/Output Summary (Last 24 hours) at 12/26/15 1321 Last data filed at 12/26/15 0700  Gross per 24 hour  Intake    912 ml  Output    400 ml  Net    512 ml    Exam:   General:  Pt is alert, follows commands appropriately, not in acute distress  Cardiovascular: Regular rate and rhythm, S1/S2, no murmurs  Respiratory: Clear to auscultation bilaterally, no wheezing, no crackles, no rhonchi  Abdomen: Soft, non tender, non distended, bowel sounds present  Extremities: +3 pedal edema, pulses DP and PT palpable bilaterally; ecchymoses in bilateral UE  Neuro: Grossly nonfocal  Data Reviewed: Basic Metabolic  Panel:  Recent Labs Lab 12/22/15 0440 12/23/15 0428 12/24/15 0555 12/25/15 0415 12/26/15 0350  NA 145 144 146* 143 145  K 3.8 3.7 4.0 4.3 4.4  CL 109 108 111 110 110  CO2 26 26 25 24 24   GLUCOSE 210* 230* 221* 201* 192*  BUN 105* 92* 87* 84* 78*  CREATININE 2.39* 2.23* 2.02* 1.91* 1.77*  CALCIUM 7.3* 7.3* 7.7* 7.9* 8.0*  MG 1.9 1.9 1.8 1.8 1.7  PHOS 3.1  3.0 3.3  3.2 3.3  3.4 3.8 3.7  3.7   Liver Function Tests:  Recent Labs Lab 12/22/15 0440 12/23/15 0428 12/24/15 0555 12/25/15 0415 12/26/15 0350  ALBUMIN 2.4* 2.3* 2.3* 2.3* 2.3*   No results for input(s): LIPASE, AMYLASE in the last 168 hours. No results for input(s): AMMONIA in the last 168 hours. CBC:  Recent Labs Lab 12/20/15 0425  WBC 5.3  HGB 11.6*  HCT 36.6*  MCV 91.0  PLT 126*   Cardiac Enzymes: No results for input(s): CKTOTAL, CKMB, CKMBINDEX, TROPONINI in the last 168 hours. BNP: Invalid input(s): POCBNP CBG:  Recent Labs Lab 12/25/15 1126 12/25/15 1654 12/25/15 2124 12/26/15 0739 12/26/15 1135  GLUCAP 194* 189* 221* 194* 197*    No results found for this or any previous visit (from the past 240 hour(s)).   Scheduled Meds: . sodium chloride   Intravenous  Once  . antiseptic oral rinse  7 mL Mouth Rinse BID  . calcitRIOL  0.25 mcg Oral Daily  . calcium gluconate  1 g Intravenous BID  . cholestyramine  4 g Oral BID  . diltiazem  240 mg Oral Daily  . feeding supplement (GLUCERNA SHAKE)  237 mL Oral Q24H  . feeding supplement (NEPRO CARB STEADY)  237 mL Oral BID BM  . fluconazole  100 mg Oral Daily  . furosemide  80 mg Oral BID  . insulin aspart  0-20 Units Subcutaneous TID WC  . insulin aspart  0-5 Units Subcutaneous QHS  . insulin glargine  53 Units Subcutaneous Daily  . labetalol  300 mg Oral BID  . loratadine  10 mg Oral Daily  . pantoprazole  40 mg Oral Daily  . predniSONE  50 mg Oral BID WC  . sertraline  50 mg Oral q morning - 10a  . sodium bicarbonate  1,300 mg Oral  BID  . sodium chloride  10-40 mL Intracatheter Q12H   Continuous Infusions:

## 2015-12-26 NOTE — Telephone Encounter (Signed)
Patient called reporting he is currently hospitalized.  Will not make tomorrow's appointments.  Advised he notify us when discharged to reschedule.  Will notify Dr. Jana Hakim and hospitalized comment added to what is currently scheduled.

## 2015-12-26 NOTE — Progress Notes (Addendum)
Name: Timothy [name] MRN: [id number] DOB: 07-Nov-1946    ADMISSION DATE:  12/06/2015 CONSULTATION DATE:  12/07/15  REFERRING MD :  Dr. Vinie Sill   CHIEF COMPLAINT:  Basilar Infiltrates, Hypoxemic Respiratory Failure   BRIEF PATIENT DESCRIPTION:  70 y/o immunocompromised male with PMH of CKD, CLL s/p stem-cell transplant, chronic graft vs host disease admitted 12/15 from cancer center with dyspnea and diffuse bilateral infiltrates.    SIGNIFICANT EVENTS  12/15 - Admit from [place] with multiple c/o's - more diarrhea, SOB, slurred speech, low blood sugars 12/19 - FOB  MICROBIOLOGY:  CMV PCR 12/27>>> BAL 12/19:  normal flora  BAL AFB 12/19>>> smear NEG  BAL Fungus 12/19>>> smear NEG Legionella 12/19>>> PCP 12/19: NEG  CMV 12/19:CMV IgG positive but IgM negative FOB tissue culture 12/19: S bovis Stool C diff 12/17:  Negative  STUDIES:  CT Chest 12/15: bilateral airspace & ground glass opacities, atherosclerosis in coronary arteries, cardiomegaly, small R pleural effusion, non-specific gallbladder distention, trace perihepatic ascites  TTE 12/15: LV with mod to severe LVH, mild reduction of systolic fxn, EF [id number], No RWMA, grade 1 diastolic dysfunction, mild mitral regurg, mild aortic regurg VQ 12/16: LOW prob PE Port CXR 12/26:  Patchy bilateral opacities. Some improvement compared with prior CXR. No pleural effusion appreciated with low lung volumes. CT chest 1/4: 1. Near complete resolution of the patchy consolidation and ground-glass opacity in the right middle lobe, lingula and bilateral lower lobes described on the 12/06/2015 chest CT study. 2. New mild patchy ground-glass opacity and interlobular septal thickening in the medial right upper lobe and lingula. New patchy areas of ground-glass opacity and new subsolid pulmonary nodule in the anterior left upper lobe. These findings are favored represent an interval nonspecific infectious or inflammatory  process  ANTIBIOTICS: Valcyte 12/27>>> Diflucan 12/15>>> Ganciclovir 12/21 - 12/27 Levaquin 12/20 - 12/26 Bactrim 12/17 - 12/20 Acyclovir 12/15 - 12/21  SUBJECTIVE:  No distress. Feels almost completely at baseline  REVIEW OF SYSTEMS:   VITAL SIGNS: Temp:  [97.6 F (36.4 C)-98 F (36.7 C)] 97.6 F (36.4 C) (01/04 1400) Pulse Rate:  [73-76] 73 (01/04 1400) Resp:  [18] 18 (01/04 1400) BP: (127-145)/(68-81) 145/81 mmHg (01/04 1400) SpO2:  [95 %-97 %] 97 % (01/04 1400) Weight:  [100.6 kg (221 lb 12.5 oz)] 100.6 kg (221 lb 12.5 oz) (01/04 0529) Room air  PHYSICAL EXAMINATION: General:  Awake. No distress. Alert.  Neuro:  Grossly nonfocal. No meningismus. Moving all 4 extremities equally. HEENT:  No oral ulcers. No scleral icterus. Moist mucous membranes. Cardiovascular: Regular rhythm. Minimal edema. Normal S1 & S2. Lungs:  Minimal basilar crackles. Normal respiratory effort on room air.    Abdomen:  Soft. Nontender. Nondistended. Integument:  Bruising of various ages. No rash on exposed skin. Warm & dry.   Recent Labs Lab 12/24/15 0555 12/25/15 0415 12/26/15 0350  NA 146* 143 145  K 4.0 4.3 4.4  CL 111 110 110  CO2 25 24 24   BUN 87* 84* 78*  CREATININE 2.02* 1.91* 1.77*  GLUCOSE 221* 201* 192*    Recent Labs Lab 12/20/15 0425  HGB 11.6*  HCT 36.6*  WBC 5.3  PLT 126*   Ct Chest Wo Contrast  12/25/2015  CLINICAL DATA:  CLL status post bone marrow transplant complicated by chronic graft-versus-host disease. Shortness of breath. Evaluate pulmonary opacities. EXAM: CT CHEST WITHOUT CONTRAST TECHNIQUE: Multidetector CT imaging of the chest was performed following the standard protocol without IV contrast. COMPARISON:  12/17/2015  chest radiograph.  12/06/2015 chest CT. FINDINGS: Mediastinum/Nodes: Normal heart size. Trace pericardial fluid/ thickening. Left anterior descending, left circumflex and right coronary atherosclerosis. Right internal jugular MediPort terminates  at the cavoatrial junction. Great vessels are normal in course and caliber. Normal visualized thyroid. Normal esophagus. No pathologically enlarged axillary, mediastinal or gross hilar lymph nodes, noting limited sensitivity for the detection of hilar adenopathy on this noncontrast study. Lungs/Pleura: No pneumothorax. There are small layering bilateral pleural effusions, right greater than left, increased bilaterally. No pleural thickening or nodularity. The previously described patchy consolidation and ground-glass opacity in the right middle lobe, lingula and bilateral lower lobes has largely resolved. There is mild to moderate passive atelectasis in the dependent lower lobes bilaterally. There is new mild patchy ground-glass opacity and interlobular septal thickening in the medial right upper lobe and lingula. There are new patchy areas of ground-glass opacity in the anterior left upper lobe and there is a new subsolid 1.7 x 1.2 cm pulmonary nodule in the anterior left upper lobe (series 5/image 19). Upper abdomen: Small volume ascites in the perihepatic and perisplenic regions, increased. Musculoskeletal: No aggressive appearing focal osseous lesions. Healing subacute nondisplaced left posterior tenth rib fracture. Stable mild T4, T7, T10 and T11 vertebral compression fractures. Moderate degenerative changes in the thoracic spine. New mild anasarca. IMPRESSION: 1. Near complete resolution of the patchy consolidation and ground-glass opacity in the right middle lobe, lingula and bilateral lower lobes described on the 12/06/2015 chest CT study. 2. New mild patchy ground-glass opacity and interlobular septal thickening in the medial right upper lobe and lingula. New patchy areas of ground-glass opacity and new subsolid pulmonary nodule in the anterior left upper lobe. These findings are favored represent an interval nonspecific infectious or inflammatory process. A follow-up unenhanced chest CT is recommended in 3  months. 3. Worsening third-spacing as indicated by increased small right greater than left bilateral pleural effusions, increased small volume ascites in the upper abdomen and new mild anasarca. Electronically Signed   By: Ilona Sorrel M.D.   On: 12/25/2015 15:20    ASSESSMENT / PLAN:   70 y.o. WM s/p stem cell transplant on chronic immunosuppression. Likely has GVHD of the lungs given improvement on chest imaging & oxygen requirement, but we did treat w/ 7d abx given Strep bovis culture in lung. Patient remains on room air and is tolerating weaning of his steroids. Has residual R>L effusions but aeration of CXR has remarkably improved.    Acute hypoxic respiratory failure: Resolved. Likely secondary to graft-versus-host disease of the lungs.  Plan -Continuing incentive spirometry while awake.  -Currently on day # 13 of steroids currently on Prednisone 50 mg po bid. -->See Dr Anastasia Pall comments re: pred taper.  - full pulmonary function testing on 1/5;  prior to beginning Prednisone taper.   HCAP Secondary to Strep bovis: Found on bronchoscopy. Completed course of antibiotics.   Acute on chronic renal failure felt 2/2 tacrolimus: Slowly improving. Nephro signed off Plan Cont PRN lasix  CLL Post Transplant: Hematology & oncology following. Prograf at toxic level (12/26 24.4). Continuing prednisone 50mg /d Plan Additional immunosuppression per their recommendations. F/u arranged w/ transplant at d/c by [address] ACNP-BC [place] Pager # [phone] OR # [phone] if no answer  12/26/2015, 2:30 PM  Attending:  I have seen and examined the patient with nurse practitioner/resident and agree with the note above.  My edits are in BOLD above.  We formulated the plan together and  I elicited the following history.    Mr. Duhart is feeling much better Off oxygen Planning to go home with PT  On exam Normal respiratory effort, scant crackles   GVHD  lungs> seems to have improved; I've never seen this before, so treating per prior recs, suppose treating as organizing pneumonia is reasonable.  So would recommend decreasing prednisone to 50mg  daily and continue on that dose until he sees Dr. Ashok Cordia on 2/2.  Will watch out for PFT tomorrow  PCCM will sign off  Call if questions  Roselie Awkward, MD [place] PCCM Pager: [phone] Cell: [phone] After 3pm or if no response, call [phone]

## 2015-12-27 ENCOUNTER — Ambulatory Visit: Payer: Self-pay

## 2015-12-27 ENCOUNTER — Encounter (HOSPITAL_COMMUNITY): Payer: Self-pay

## 2015-12-27 ENCOUNTER — Ambulatory Visit: Payer: Self-pay | Admitting: Nurse Practitioner

## 2015-12-27 ENCOUNTER — Other Ambulatory Visit: Payer: Self-pay

## 2015-12-27 LAB — RENAL FUNCTION PANEL
ANION GAP: 9 (ref 5–15)
Albumin: 2.3 g/dL — ABNORMAL LOW (ref 3.5–5.0)
BUN: 77 mg/dL — ABNORMAL HIGH (ref 6–20)
CALCIUM: 8.2 mg/dL — AB (ref 8.9–10.3)
CHLORIDE: 111 mmol/L (ref 101–111)
CO2: 25 mmol/L (ref 22–32)
Creatinine, Ser: 1.68 mg/dL — ABNORMAL HIGH (ref 0.61–1.24)
GFR calc non Af Amer: 40 mL/min — ABNORMAL LOW (ref >60)
GFR, EST AFRICAN AMERICAN: 46 mL/min — AB (ref >60)
Glucose, Bld: 175 mg/dL — ABNORMAL HIGH (ref 65–99)
POTASSIUM: 4.2 mmol/L (ref 3.5–5.1)
Phosphorus: 3.8 mg/dL (ref 2.5–4.6)
Sodium: 145 mmol/L (ref 135–145)

## 2015-12-27 LAB — PHOSPHORUS: Phosphorus: 3.7 mg/dL (ref 2.5–4.6)

## 2015-12-27 LAB — GLUCOSE, CAPILLARY
Glucose-Capillary: 133 mg/dL — ABNORMAL HIGH (ref 65–99)
Glucose-Capillary: 148 mg/dL — ABNORMAL HIGH (ref 65–99)
Glucose-Capillary: 176 mg/dL — ABNORMAL HIGH (ref 65–99)
Glucose-Capillary: 163 mg/dL — ABNORMAL HIGH (ref 65–99)

## 2015-12-27 LAB — MAGNESIUM: Magnesium: 1.7 mg/dL (ref 1.7–2.4)

## 2015-12-27 MED ORDER — FUROSEMIDE 80 MG PO TABS: 80.0000 mg | ORAL_TABLET | Freq: Two times a day (BID) | ORAL | Status: AC

## 2015-12-27 MED ORDER — MONTELUKAST SODIUM 5 MG PO CHEW
5.0000 mg | CHEWABLE_TABLET | Freq: Every day | ORAL | Status: DC
  Administered 2015-12-27: 5 mg via ORAL
  Filled 2015-12-27: qty 1

## 2015-12-27 MED ORDER — AZITHROMYCIN 250 MG PO TABS: ORAL_TABLET | ORAL | Status: AC

## 2015-12-27 MED ORDER — NEPRO/CARBSTEADY PO LIQD: 237.0000 mL | Freq: Two times a day (BID) | ORAL | Status: AC

## 2015-12-27 MED ORDER — MONTELUKAST SODIUM 4 MG PO CHEW
4.0000 mg | CHEWABLE_TABLET | Freq: Every day | ORAL | Status: DC
  Filled 2015-12-27: qty 1

## 2015-12-27 MED ORDER — MONTELUKAST SODIUM 4 MG PO CHEW: 4.0000 mg | CHEWABLE_TABLET | Freq: Every day | ORAL | Status: AC

## 2015-12-27 MED ORDER — AZITHROMYCIN 250 MG PO TABS
250.0000 mg | ORAL_TABLET | Freq: Every day | ORAL | Status: DC
  Administered 2015-12-27 – 2015-12-28 (×2): 250 mg via ORAL
  Filled 2015-12-27 (×2): qty 1

## 2015-12-27 MED ORDER — PREDNISONE 50 MG PO TABS: 50.0000 mg | ORAL_TABLET | Freq: Every day | ORAL | Status: AC

## 2015-12-27 MED ORDER — LOPERAMIDE HCL 2 MG PO CAPS: 2.0000 mg | ORAL_CAPSULE | ORAL | Status: AC | PRN

## 2015-12-27 MED ORDER — INSULIN GLARGINE 100 UNIT/ML ~~LOC~~ SOLN: 53.0000 [IU] | Freq: Every day | SUBCUTANEOUS | Status: AC

## 2015-12-27 MED ORDER — TIOTROPIUM BROMIDE MONOHYDRATE 18 MCG IN CAPS
18.0000 ug | ORAL_CAPSULE | Freq: Every day | RESPIRATORY_TRACT | Status: DC
  Administered 2015-12-27 – 2015-12-28 (×2): 18 ug via RESPIRATORY_TRACT
  Filled 2015-12-27: qty 5

## 2015-12-27 MED ORDER — AZITHROMYCIN 250 MG PO TABS: ORAL_TABLET | ORAL | Status: DC

## 2015-12-27 MED ORDER — TIOTROPIUM BROMIDE MONOHYDRATE 18 MCG IN CAPS: 18.0000 ug | ORAL_CAPSULE | Freq: Every day | RESPIRATORY_TRACT | Status: AC

## 2015-12-27 NOTE — Care Management Note (Signed)
Case Management Note  Patient Details  Name: Timothy [name] MRN: [id number] Date of Birth: 26-Dec-1945  Subjective/Objective:  S[poke to spouse about d/c plans-she wants [place] home care-[facility] [place] rep able to accept-HHRN/PT/aide, social worker-Awaiting [place] orders.  DME needed- MD please specify need in the face 2 face-hospital bed, overlay gel mattress,hoyer lift,overbed table, 3n1-[facility] does not have hoyer lift, Will use  [facility]-Spoke to [facility] tel#[phone]/fax#[phone].Ambulance transp needed-CSW following.                 Action/Plan:d/c home w/HHC/DME.   Expected Discharge Date:   (unknown)               Expected Discharge Plan:  [place]  In-House Referral:     Discharge planning Services  CM Consult  Post Acute Care Choice:    Choice offered to:  Spouse  DME Arranged:    DME Agency:  [place]  HH Arranged:    [place] Agency:  [place]  Status of Service:  Completed, signed off  Medicare Important Message Given:    Date Medicare IM Given:    Medicare IM give by:    Date Additional Medicare IM Given:    Additional Medicare Important Message give by:     If discussed at [place] of Stay Meetings, dates discussed:    Additional Comments:  Dessa Phi, RN 12/27/2015, 10:20 AM

## 2015-12-27 NOTE — Progress Notes (Signed)
Patient's family has not been able to locate an SNF they can afford that would be appropriate for this very immunocompromised patient, so the plan will be to discharge to home. Equipment is being obtained. They will need  PFTs planned for today Diabetes team to review management-- patient will be on high-dose steroids for several months and will need TIDAC and HS glucose monitoring with SSI in addition to baseline PTh to provide baseline assessment to be followed by home PTx  I have discussed case with the transplant team at [facility]. They have suggested daily zithromax 250 mg for suppression, singulair and spiriva; as outpatient we will start serolimus and follow levels.   I am on call today and willcome by after clinic to review meds with a  View of possible d/c to home tomorrow if above can be operationalized  Thank you for your help to this complex patient!

## 2015-12-27 NOTE — Care Management Note (Signed)
Case Management Note  Patient Details  Name: KEONTRE CLOUTIER MRN: [id number] Date of Birth: 1946-07-18  Subjective/Objective:   [facility] needs gel overlay form signed by MD-MD notified-form in shadow chart.  Please fax back to [facility] fax cover sheet in shadow chart-#[phone]/tel#[phone].MD Nsg updated.    Likely d/c in am.             Action/Plan:d/c plan home w/HHC/DME.   Expected Discharge Date:   (unknown)               Expected Discharge Plan:  [place]  In-House Referral:     Discharge planning Services  CM Consult  Post Acute Care Choice:    Choice offered to:  Spouse  DME Arranged:  3-N-1, Air overlay mattress, Lightweight manual wheelchair with seat cushion, Overbed table, Hospital bed, Other see comment (hoyer lift) DME Agency:  Other - Comment ([facility])  HH Arranged:  RN, PT, Nurse's Aide [employer] Agency:  [place]  Status of Service:  Completed, signed off  Medicare Important Message Given:    Date Medicare IM Given:    Medicare IM give by:    Date Additional Medicare IM Given:    Additional Medicare Important Message give by:     If discussed at [place] of Stay Meetings, dates discussed:    Additional Comments:  Dessa Phi, RN 12/27/2015, 4:09 PM

## 2015-12-27 NOTE — Discharge Summary (Signed)
Physician Discharge Summary  Timothy [name] [id number] DOB: 11-02-1946 DOA: 12/06/2015  PCP: Marton Redwood, MD  Admit date: 12/06/2015 Discharge date: 12/27/2015  Recommendations for Outpatient Follow-up:  Please note new meds on discharge per oncology for suppression in anticipaiton of transplant  Discharge Diagnoses:  Principal Problem:   Acute respiratory failure ([place]) Active Problems:   Transplant recipient   CMV (cytomegalovirus) antibody positive   Myopathy   Immunocompromised ([name])   Chronic GVHD complicating bone marrow transplantation (HCC)   CLL (chronic lymphocytic leukemia) ([place])   Physical deconditioning   Steroid-induced diabetes mellitus ([place])   Diarrhea   Weakness   Shortness of breath   PNA (pneumonia)   Pulmonary infiltrate   Acute on chronic renal failure (HCC)   Essential hypertension   Acute renal failure superimposed on stage 4 chronic kidney disease ([place])   Uncontrolled type 2 diabetes mellitus with diabetic nephropathy, with long-term current use of insulin (HCC)   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 male with past medical history of CLL s/p stem cell transplant, chronic GVHD on chronic immunosuppresion, CKD who presented to [facility] 12/06/15 from cancer center with shortness of breath, low blood sugars, diarrhea. Pt was under PCCM care and then transitioned to [facility] 12/19/2015. In short, initial hypoxia and shortness of breath thought to be due to GVHD. Pt is s/p bronch with BAL brushing and transbronchial biopsies 12/10/15. He was treated with abx for 7 days for strep bovis culture in the lung. He is being followed by his oncologist Dr. Jana Hakim, infectious disease and nephrology. Patient's creatinine was as high as 3.7 on the admission (and his baseline around 2.6 in 09/2015) and this acute on chronic renal  failure was thought to be due to prograf which has been on hold since. He continues to have diarrhea but his C.diff was negative 12/08/2015. Additionally, CMV 12/10/15 showed CMV IgG positive but IgM negative. He was on valaganciclovir through 12/25/2015.   Hospital Course:    Assessment/Plan:    Acute respiratory failure with hypoxemia/HCAP/S. Bovis lung infection in an immunocompromised host. - Likely due to S. Bovis in the setting of immunocompromise and GVHD. S/P Bronchoscopy/BAL. - Resolved.  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  - Continue prednisone 50 mg daily   Deconditioning/weakness - Evaluated by physical therapist 12/22/15. SNF recommended. Appreciate social work assistance with placement.  Diarrhea - C.diff was negative. - Diarrhea improved  CMV IgG - Valcytel completed 12/25/2015.  Acute renal failrure superimposed on CKD stage 4 / hypocalcemia - Baseline Cr in 09/2015 was 2.6 and on this admission 3.7, now improved to 2.02. - Likely from prograf. Prograf given toxic level (12/26 24.4) and has been stopped. - Nephrology has seen the patient in consultation and recommended to hold Prograf - Continue sodium bicarbonate and Calcitrol   CLL  - Post Transplant. - Continuing prednisone 50 mg by mouth daily - Also azithromycin, spiriva,  Singulair for suppression in anticipation for transplant   Depression - Continue Zoloft.   Uncontrolled diabetes mellitus with diabetic nephropathy with long term insulin use - Currently being managed with Lantus 53 units daily and NovoLog 10-20 units 3 times daily which is controlling his sugars reasonably well  Essential hypertension - Continue labetalol 300 mg twice daily and Cardizem 240 mg daily.   Atrial fibrillation - CHADS vasc score at  least 3. - Rate controlled with Cardizem.  - Not on AC due to thrombocytopenia.  Anemia of chronic disease / Thrombocytopenia - Due to  history of CLL.  DVT Prophylaxis  - SCD's bilaterally in hospital    Code Status: Full.  Family Communication: plan of care discussed with the patient and his wife at the bedside   IV access:  Peripheral IV  Procedures and diagnostic studies:   Dg Chest 2 View 12/07/2015 Stable appearance of the bilateral lower lobe airspace opacities, right greater than left concerning for pneumonia. Electronically Signed By: Rolm Baptise M.D. On: 12/07/2015 12:25   Ct Chest Wo Contrast 12/06/2015 1. Bilateral airspace and ground-glass opacities, favor infection. Given dependent distribution, aspiration could look similar. 2. Atherosclerosis, including within the coronary arteries. 3. Cardiomegaly with small right pleural effusion. 4. Nonspecific gallbladder distension. Suggestion of edema in the porta hepatis. Correlate with upper abdominal symptoms. Consider correlation with pancreatic enzyme levels to exclude pancreatitis. Depending on symptomatology, abdominal ultrasound or CT may be informative. 5. Trace perihepatic ascites. Electronically Signed By: Abigail Miyamoto M.D. On: 12/06/2015 18:46   Nm Pulmonary Perf And Vent 12/07/2015 Low probability for acute pulmonary embolus. Electronically Signed By: Kerby Moors M.D. On: 12/07/2015 15:38   US Renal Port 12/06/2015 No acute finding by renal ultrasound. Electronically Signed By: Jerilynn Mages. Shick M.D. On: 12/06/2015 21:13    Medical Consultants:  Oncology: Dr. Jana Hakim Nephrology: Dr. Melvia Heaps PCCM  IAnti-Infectives:   Diflucan 12/15>>> Valcyte 12/27>>>1/3 Ganciclovir 12/21 - 12/27 Levaquin 12/20 - 12/26 Bactrim 12/17 - 12/20 Acyclovir 12/15 - 12/21   Signed:  Leisa Lenz, MD  [name] 12/27/2015, 11:00 AM  Pager #: [phone]  Time spent in minutes: more than 30 minutes   Discharge Exam: Filed Vitals:   12/26/15 2140 12/27/15 0627  BP: 136/77 125/84  Pulse: 71 63  Temp: 97.7 F (36.5  C) 97.5 F (36.4 C)  Resp: 18 18   Filed Vitals:   12/26/15 0529 12/26/15 1400 12/26/15 2140 12/27/15 0627  BP: 132/68 145/81 136/77 125/84  Pulse: 76 73 71 63  Temp: 98 F (36.7 C) 97.6 F (36.4 C) 97.7 F (36.5 C) 97.5 F (36.4 C)  TempSrc: Oral Oral Oral Oral  Resp: 18 18 18 18   Height:      Weight: 221 lb 12.5 oz (100.6 kg)   220 lb 10.9 oz (100.1 kg)  SpO2: 95% 97% 95% 97%    General: Pt is alert, follows commands appropriately, not in acute distress Cardiovascular: Regular rate and rhythm, S1/S2 +, no murmurs Respiratory: Clear to auscultation bilaterally, no wheezing, no crackles, no rhonchi Abdominal: Soft, non tender, non distended, bowel sounds +, no guarding Extremities: no edema, no cyanosis, pulses palpable bilaterally DP and PT Neuro: Grossly nonfocal  Discharge Instructions  Discharge Instructions    Call MD for:  difficulty breathing, headache or visual disturbances    Complete by:  As directed      Call MD for:  difficulty breathing, headache or visual disturbances    Complete by:  As directed      Call MD for:  extreme fatigue    Complete by:  As directed      Call MD for:  persistant dizziness or light-headedness    Complete by:  As directed      Call MD for:  persistant dizziness or light-headedness    Complete by:  As directed      Call MD for:  persistant nausea and vomiting  Complete by:  As directed      Call MD for:  persistant nausea and vomiting    Complete by:  As directed      Call MD for:  severe uncontrolled pain    Complete by:  As directed      Call MD for:  severe uncontrolled pain    Complete by:  As directed      Diet - low sodium heart healthy    Complete by:  As directed      Diet - low sodium heart healthy    Complete by:  As directed      Discharge instructions    Complete by:  As directed        Increase activity slowly    Complete by:  As directed      Increase activity slowly    Complete by:  As directed              Medication List    STOP taking these medications        acyclovir 400 MG tablet  Commonly known as:  ZOVIRAX     budesonide 3 MG 24 hr capsule  Commonly known as:  ENTOCORT EC     insulin aspart 100 unit/mL injection  Commonly known as:  novoLOG     pseudoephedrine 60 MG tablet  Commonly known as:  SUDAFED     sulfamethoxazole-trimethoprim 800-160 MG tablet  Commonly known as:  BACTRIM DS,SEPTRA DS     tacrolimus 0.5 MG capsule  Commonly known as:  PROGRAF      TAKE these medications        azithromycin 250 MG tablet  Commonly known as:  ZITHROMAX  Take 250 mg once a day for 4 days.     calcitRIOL 0.25 MCG capsule  Commonly known as:  ROCALTROL  Take 1 capsule (0.25 mcg total) by mouth daily.     cholestyramine 4 g packet  Commonly known as:  QUESTRAN  Take 1 packet (4 g total) by mouth 2 (two) times daily.     diclofenac sodium 1 % Gel  Commonly known as:  VOLTAREN  Apply 2 g topically 2 (two) times daily.     diltiazem 240 MG 24 hr capsule  Commonly known as:  CARDIZEM CD  Take 1 capsule (240 mg total) by mouth daily.     feeding supplement (GLUCERNA SHAKE) Liqd  Take 237 mLs by mouth 2 (two) times daily between meals.     feeding supplement (NEPRO CARB STEADY) Liqd  Take 237 mLs by mouth daily.     feeding supplement (NEPRO CARB STEADY) Liqd  Take 237 mLs by mouth 2 (two) times daily between meals.     fluconazole 100 MG tablet  Commonly known as:  DIFLUCAN  TAKE 1 TABLET BY MOUTH ONCE DAILY     furosemide 80 MG tablet  Commonly known as:  LASIX  Take 1 tablet (80 mg total) by mouth 2 (two) times daily.     hydrocortisone 2.5 % rectal cream  Commonly known as:  ANUSOL-HC  Place rectally 3 (three) times daily as needed for hemorrhoids or itching.     insulin glargine 100 UNIT/ML injection  Commonly known as:  LANTUS  Inject 0.53 mLs (53 Units total) into the skin daily.     Insulin Pen Needle 31G X 5 MM Misc  Commonly known as:  B-D UF  III MINI PEN NEEDLES  Use five daily with insulin as directed.  labetalol 300 MG tablet  Commonly known as:  NORMODYNE  Take 1 tablet (300 mg total) by mouth 2 (two) times daily.     loperamide 2 MG capsule  Commonly known as:  IMODIUM  Take 1 capsule (2 mg total) by mouth as needed for diarrhea or loose stools.     loratadine 10 MG tablet  Commonly known as:  CLARITIN  Take 10 mg by mouth daily as needed for allergies.     montelukast 4 MG chewable tablet  Commonly known as:  SINGULAIR  Chew 1 tablet (4 mg total) by mouth at bedtime.     omeprazole 20 MG capsule  Commonly known as:  PRILOSEC  Take 1 capsule (20 mg total) by mouth daily.     ondansetron 8 MG disintegrating tablet  Commonly known as:  ZOFRAN-ODT  Take 1 tablet (8 mg total) by mouth every 8 (eight) hours as needed for nausea or vomiting.     ONE TOUCH ULTRA TEST test strip  Generic drug:  glucose blood  TEST BEFORE MEALS AND AT BEDTIME.     ONETOUCH DELICA LANCETS [id number] Misc  USE TO TEST BEFORE MEALS AND AT BEDTIME     predniSONE 50 MG tablet  Commonly known as:  DELTASONE  Take 1 tablet (50 mg total) by mouth daily with breakfast.     prochlorperazine 10 MG tablet  Commonly known as:  COMPAZINE  Take 1 tablet (10 mg total) by mouth every 6 (six) hours as needed for nausea or vomiting.     sertraline 50 MG tablet  Commonly known as:  ZOLOFT  Take 1 tablet (50 mg total) by mouth every morning.     sodium bicarbonate 650 MG tablet  Take 2 tablets (1,300 mg total) by mouth daily.     tiotropium 18 MCG inhalation capsule  Commonly known as:  SPIRIVA  Place 1 capsule (18 mcg total) into inhaler and inhale daily.     TRESIBA FLEXTOUCH 200 UNIT/ML Sopn  Generic drug:  Insulin Degludec  Inject 50 Units as directed daily.           Follow-up Information    Follow up with Marton Redwood, MD. Schedule an appointment as soon as possible for a visit in 1 week.   Specialty:  Internal Medicine   Why:   Follow up appt after recent hospitalization   Contact information:   [address] [place] [address] [phone]       Follow up with Tera Partridge, MD On 01/24/2016.   Specialty:  Pulmonary Disease   Why:  230pm   Contact information:   [address] [place] [address] [phone]       Follow up with Marton Redwood, MD. Schedule an appointment as soon as possible for a visit in 1 week.   Specialty:  Internal Medicine   Why:  Follow up appt after recent hospitalization   Contact information:   [place] [address] [phone]        The results of significant diagnostics from this hospitalization (including imaging, microbiology, ancillary and laboratory) are listed below for reference.    Significant Diagnostic Studies: Dg Chest 2 View  12/11/2015  CLINICAL DATA:  Weakness and shortness of breath today. History of pneumonia and leukemia. EXAM: CHEST  2 VIEW COMPARISON:  12/10/2015 FINDINGS: Right IJ Port-A-Cath unchanged. Lungs are somewhat hypoinflated and demonstrate continued airspace opacification over the right lobe and posterior lower lobes with slight interval  improvement. Stable hazy opacification over the left mid upper lung. Small amount of bilateral pleural fluid posteriorly. Stable cardiomegaly. Remainder the exam is unchanged. IMPRESSION: Hypoinflation with multifocal airspace process with slight interval improvement likely multifocal pneumonia. Small amount of posterior pleural fluid. Right IJ Port-A-Cath unchanged. Electronically Signed   By: Marin Olp M.D.   On: 12/11/2015 11:51   Dg Chest 2 View  12/10/2015  CLINICAL DATA:  Shortness of breath. History of chronic lymphocytic leukemia EXAM: CHEST  2 VIEW COMPARISON:  Study obtained earlier in the day and chest CT December 06, 2015 FINDINGS: There is extensive airspace consolidation in the right lower lobe. There is more patchy infiltrate throughout much of the left upper  lobe and to a lesser extent right upper lobe, stable. Compared to earlier in the day, there is new consolidation lateral left base. Heart is upper normal in size with pulmonary vascular within normal limits. Port-A-Cath tip is in the superior vena cava. No adenopathy appreciable. Multiple compression fractures in the thoracic spine appear stable. IMPRESSION: Multifocal pneumonia with new infiltrate in the lateral left base compared to earlier in the day. Other areas of infiltrate appears stable. No change in cardiac silhouette. Electronically Signed   By: Lowella Grip III M.D.   On: 12/10/2015 20:29   Dg Chest 2 View  12/08/2015  CLINICAL DATA:  70 year old male with pneumonia and shortness of breath. Recent bone marrow transplant. EXAM: CHEST  2 VIEW COMPARISON:  12/07/2015 and prior radiograph FINDINGS: The cardiomediastinal silhouette is unchanged. A right Port-A-Cath with tip overlying the superior cavoatrial junction again noted. Increasing right upper lung airspace disease/pneumonia is noted. Bilateral lower lung airspace disease is unchanged. No large pleural effusions or pneumothorax noted. IMPRESSION: Increasing right upper lung airspace disease/pneumonia. Unchanged bilateral lower lung airspace disease. Electronically Signed   By: Margarette Canada M.D.   On: 12/08/2015 11:39   Dg Chest 2 View  12/07/2015  CLINICAL DATA:  Shortness of breath, pre VQ scan. EXAM: CHEST  2 VIEW COMPARISON:  Chest CT 12/06/2015 FINDINGS: Focal consolidation noted in the right lung base concerning for pneumonia, this is similar to prior CT. Minimal left basilar density, also similar. Heart is mildly enlarged. No effusions. No acute bony abnormality. IMPRESSION: Stable appearance of the bilateral lower lobe airspace opacities, right greater than left concerning for pneumonia. Electronically Signed   By: Rolm Baptise M.D.   On: 12/07/2015 12:25   Ct Chest Wo Contrast  12/25/2015  CLINICAL DATA:  CLL status post bone  marrow transplant complicated by chronic graft-versus-host disease. Shortness of breath. Evaluate pulmonary opacities. EXAM: CT CHEST WITHOUT CONTRAST TECHNIQUE: Multidetector CT imaging of the chest was performed following the standard protocol without IV contrast. COMPARISON:  12/17/2015 chest radiograph.  12/06/2015 chest CT. FINDINGS: Mediastinum/Nodes: Normal heart size. Trace pericardial fluid/ thickening. Left anterior descending, left circumflex and right coronary atherosclerosis. Right internal jugular MediPort terminates at the cavoatrial junction. Great vessels are normal in course and caliber. Normal visualized thyroid. Normal esophagus. No pathologically enlarged axillary, mediastinal or gross hilar lymph nodes, noting limited sensitivity for the detection of hilar adenopathy on this noncontrast study. Lungs/Pleura: No pneumothorax. There are small layering bilateral pleural effusions, right greater than left, increased bilaterally. No pleural thickening or nodularity. The previously described patchy consolidation and ground-glass opacity in the right middle lobe, lingula and bilateral lower lobes has largely resolved. There is mild to moderate passive atelectasis in the dependent lower lobes bilaterally. There is new mild patchy ground-glass  opacity and interlobular septal thickening in the medial right upper lobe and lingula. There are new patchy areas of ground-glass opacity in the anterior left upper lobe and there is a new subsolid 1.7 x 1.2 cm pulmonary nodule in the anterior left upper lobe (series 5/image 19). Upper abdomen: Small volume ascites in the perihepatic and perisplenic regions, increased. Musculoskeletal: No aggressive appearing focal osseous lesions. Healing subacute nondisplaced left posterior tenth rib fracture. Stable mild T4, T7, T10 and T11 vertebral compression fractures. Moderate degenerative changes in the thoracic spine. New mild anasarca. IMPRESSION: 1. Near complete  resolution of the patchy consolidation and ground-glass opacity in the right middle lobe, lingula and bilateral lower lobes described on the 12/06/2015 chest CT study. 2. New mild patchy ground-glass opacity and interlobular septal thickening in the medial right upper lobe and lingula. New patchy areas of ground-glass opacity and new subsolid pulmonary nodule in the anterior left upper lobe. These findings are favored represent an interval nonspecific infectious or inflammatory process. A follow-up unenhanced chest CT is recommended in 3 months. 3. Worsening third-spacing as indicated by increased small right greater than left bilateral pleural effusions, increased small volume ascites in the upper abdomen and new mild anasarca. Electronically Signed   By: Ilona Sorrel M.D.   On: 12/25/2015 15:20   Ct Chest Wo Contrast  12/06/2015  CLINICAL DATA:  Shortness of breath. Kidney failure. Hypocalcemia. Hypoglycemia. History of leukemia, status post bone marrow transplant in 2013. EXAM: CT CHEST WITHOUT CONTRAST TECHNIQUE: Multidetector CT imaging of the chest was performed following the standard protocol without IV contrast. COMPARISON:  02/19/2015 FINDINGS: Mediastinum/Nodes: The right-sided Port-A-Cath which terminates at the superior caval/ atrial junction. Tortuous descending thoracic aorta. Mild cardiomegaly. Multivessel coronary artery atherosclerosis. No mediastinal or definite hilar adenopathy, given limitations of unenhanced CT. Lungs/Pleura: Tiny right-sided pleural effusion. Right worse than left bibasilar and dependent upper lobe airspace and ground-glass opacity with mild septal thickening. Upper abdomen: Trace perihepatic ascites. Normal imaged portions of the spleen, stomach, adrenal glands, kidneys. The gallbladder is incompletely imaged but may be distended. This is similar to on the prior exam. No calcified stone. Subtle edema in the porta hepatis, including on image 56/series 2. Musculoskeletal:  Remote left-sided rib fractures. Osteopenia. Advanced thoracolumbar spondylosis. Mild superior endplate compression deformity at T4 is chronic. Interval healing of previously described T10 compression deformity. A moderate T12 compression deformity is not significantly changed. IMPRESSION: 1. Bilateral airspace and ground-glass opacities, favor infection. Given dependent distribution, aspiration could look similar. 2.  Atherosclerosis, including within the coronary arteries. 3. Cardiomegaly with small right pleural effusion. 4. Nonspecific gallbladder distension. Suggestion of edema in the porta hepatis. Correlate with upper abdominal symptoms. Consider correlation with pancreatic enzyme levels to exclude pancreatitis. Depending on symptomatology, abdominal ultrasound or CT may be informative. 5. Trace perihepatic ascites. Electronically Signed   By: Abigail Miyamoto M.D.   On: 12/06/2015 18:46   Nm Pulmonary Perf And Vent  12/07/2015  CLINICAL DATA:  Shortness of breath for 1 and half weeks. Lower extremity swelling. EXAM: NUCLEAR MEDICINE VENTILATION - PERFUSION LUNG SCAN TECHNIQUE: Ventilation images were obtained in multiple projections using inhaled aerosol Tc-63m DTPA. Perfusion images were obtained in multiple projections after intravenous injection of Tc-69m MAA. RADIOPHARMACEUTICALS:  31 millicurie [id number] DTPA aerosol inhalation and 0000000 millicurie [id number] MAA IV COMPARISON:  12/07/2015 FINDINGS: Ventilation: No focal ventilation defect. Central scratch set deposition of the radiopharmaceutical within the central airway is noted. Perfusion: No wedge shaped peripheral perfusion defects to  suggest acute pulmonary embolism. IMPRESSION: Low probability for acute pulmonary embolus. Electronically Signed   By: Kerby Moors M.D.   On: 12/07/2015 15:38   US Renal Port  12/06/2015  CLINICAL DATA:  Acute renal failure EXAM: RENAL / URINARY TRACT ULTRASOUND COMPLETE COMPARISON:  02/19/2015  FINDINGS: Right Kidney: Length: 11.6 cm. Echogenicity within normal limits. No mass or hydronephrosis visualized. Left Kidney: Length: 10.9 cm. Echogenicity within normal limits. No mass or hydronephrosis visualized. Bladder: Limit assessment.  Under distended.  Grossly unremarkable. IMPRESSION: No acute finding by renal ultrasound. Electronically Signed   By: Jerilynn Mages.  Shick M.D.   On: 12/06/2015 21:13   Dg Chest Port 1 View  12/17/2015  CLINICAL DATA:  Acute respiratory failure EXAM: PORTABLE CHEST 1 VIEW COMPARISON:  12/16/2015 and 12/ 23/16 FINDINGS: Cardiomediastinal silhouette is stable. There is poor inspiration with mild basilar atelectasis. No segmental infiltrate. Right IJ Port-A-Cath is unchanged in position. Residual streaky mild edema or pneumonitis in lingula. Minimal central vascular congestion without global pulmonary edema IMPRESSION: There is poor inspiration with mild basilar atelectasis. No segmental infiltrate. Right IJ Port-A-Cath is unchanged in position. Residual streaky mild edema or pneumonitis in lingula. Electronically Signed   By: Lahoma Crocker M.D.   On: 12/17/2015 09:47   Dg Chest Port 1 View  12/16/2015  CLINICAL DATA:  Acute respiratory failure EXAM: PORTABLE CHEST 1 VIEW COMPARISON:  12/14/2015 FINDINGS: Right chest wall port a catheter is noted with tip in the cavoatrial junction. Moderate cardiac enlargement. Decreased lung volumes. Pulmonary edema pattern is stable to improved in the interval. IMPRESSION: 1. Stable to improved appearance of pulmonary edema pattern. Electronically Signed   By: Kerby Moors M.D.   On: 12/16/2015 09:22   Dg Chest Port 1 View  12/14/2015  CLINICAL DATA:  Respiratory failure. EXAM: PORTABLE CHEST - 1 VIEW COMPARISON:  One-view chest x-ray 12/13/2015. FINDINGS: The heart size is normal. The lung volumes remain low. A diffuse interstitial pattern is slightly increased compared to the prior study. Bibasilar airspace disease is evident. A right IJ  Port-A-Cath is stable. IMPRESSION: Slight increase in diffuse interstitial and airspace pattern compatible with edema and pneumonia. Electronically Signed   By: San Morelle M.D.   On: 12/14/2015 07:31   Dg Chest Port 1 View  12/13/2015  CLINICAL DATA:  70 year old male with shortness of breath, respiratory failure, pneumonia. Initial encounter. EXAM: PORTABLE CHEST 1 VIEW COMPARISON:  12/12/2015 and earlier. FINDINGS: Portable AP semi upright view at 0440 hours. Stable right chest porta cath. Stable lung volumes. Stable cardiac size and mediastinal contours. No pneumothorax or pleural effusion. Coarse and patchy bilateral perihilar opacity is stable since yesterday, mildly regressed in the right lower lung since 12/11/2015. No areas of worsening ventilation. IMPRESSION: Bilateral multifocal pneumonia stable since yesterday. Right lower lung ventilation mildly improved since 12/11/2015. Electronically Signed   By: Genevie Ann M.D.   On: 12/13/2015 07:15   Dg Chest Port 1 View  12/12/2015  CLINICAL DATA:  Shortness of breath. Weakness. History of pneumonia and the [place]. EXAM: PORTABLE CHEST 1 VIEW COMPARISON:  12/11/2015 and previous FINDINGS: Pulmonary infiltrate in the right lower lobe is improving. However, there is worsened interstitial density throughout the lungs in general, particularly the upper lobes. This could go along with developing edema or spreading of the pneumonia. No visible effusion. Port-A-Cath on the right is unchanged. IMPRESSION: Continued improvement of the right lower lobe infiltrate. Worsening of generalized interstitial lung density, upper lobe predominant. Differential diagnosis is  developing interstitial edema/ fluid overload versus generalized worsening of pneumonia. Electronically Signed   By: Nelson Chimes M.D.   On: 12/12/2015 08:20   Dg Chest Port 1 View  12/10/2015  CLINICAL DATA:  70 year old male post bronchoscopy right middle lobe. Subsequent encounter. EXAM:  PORTABLE CHEST 1 VIEW COMPARISON:  12/08/2015. FINDINGS: Curvilinear structure right lung apex probably pleural reflection associated with rib rather than tiny pneumothorax. Attention to this on follow up. Asymmetric airspace disease greater on the right. When compared to the most recent chest x-ray, decrease in degree of consolidation right lung apex and slight increase in left perihilar consolidation. Pulmonary vascular congestion. Right MediPort catheter tip mid superior vena cava level. Heart size top-normal to slightly enlarged. IMPRESSION: Curvilinear structure right lung apex probably pleural reflection associated with rib rather than tiny pneumothorax. Attention to this on follow up. Asymmetric airspace disease greater on the right. When compared to the most recent chest x-ray, decrease in degree of consolidation right lung apex and slight increase in left perihilar consolidation. Pulmonary vascular congestion. Electronically Signed   By: Genia Del M.D.   On: 12/10/2015 10:26   Dg C-arm Bronchoscopy  12/10/2015  CLINICAL DATA:  C-ARM BRONCHOSCOPY Fluoroscopy was utilized by the requesting physician.  No radiographic interpretation.    Microbiology: No results found for this or any previous visit (from the past 240 hour(s)).   Labs: Basic Metabolic Panel:  Recent Labs Lab 12/23/15 0428 12/24/15 0555 12/25/15 0415 12/26/15 0350 12/27/15 0451  NA 144 146* 143 145 145  K 3.7 4.0 4.3 4.4 4.2  CL 108 111 110 110 111  CO2 26 25 24 24 25   GLUCOSE 230* 221* 201* 192* 175*  BUN 92* 87* 84* 78* 77*  CREATININE 2.23* 2.02* 1.91* 1.77* 1.68*  CALCIUM 7.3* 7.7* 7.9* 8.0* 8.2*  MG 1.9 1.8 1.8 1.7 1.7  PHOS 3.3  3.2 3.3  3.4 3.8 3.7  3.7 3.8  3.7   Liver Function Tests:  Recent Labs Lab 12/23/15 0428 12/24/15 0555 12/25/15 0415 12/26/15 0350 12/27/15 0451  ALBUMIN 2.3* 2.3* 2.3* 2.3* 2.3*   No results for input(s): LIPASE, AMYLASE in the last 168 hours. No results for  input(s): AMMONIA in the last 168 hours. CBC: No results for input(s): WBC, NEUTROABS, HGB, HCT, MCV, PLT in the last 168 hours. Cardiac Enzymes: No results for input(s): CKTOTAL, CKMB, CKMBINDEX, TROPONINI in the last 168 hours. BNP: BNP (last 3 results)  Recent Labs  03/07/15 1053  BNP 358.3*    ProBNP (last 3 results) No results for input(s): PROBNP in the last 8760 hours.  CBG:  Recent Labs Lab 12/26/15 0739 12/26/15 1135 12/26/15 1634 12/26/15 2135 12/27/15 0732  GLUCAP 194* 197* 202* 157* 133*

## 2015-12-27 NOTE — Progress Notes (Signed)
CSW consulted for transportation needs. Patient will need non-emergency ambulance transport home. CSW confirmed home address with patient's wife, [name].   Wife aware she needs to call CSW once equipment has been delivered, then [facility] can be called for transport.    Raynaldo Opitz, [facility] Clinical Social Worker cell #: [phone]

## 2015-12-27 NOTE — Care Management Note (Signed)
Case Management Note  Patient Details  Name: Timothy [name] MRN: [id number] Date of Birth: 07-Apr-1946  Subjective/Objective:   Spoke to [facility] supply-[facility]-they will still deliver dme to patient's home-they made arrangements w/spouse.Still need MD to sign form in shadow chart,then fax back to [facility].                 Action/Plan:   Expected Discharge Date:   (unknown)               Expected Discharge Plan:  [place]  In-House Referral:     Discharge planning Services  CM Consult  Post Acute Care Choice:    Choice offered to:  Spouse  DME Arranged:  3-N-1, Air overlay mattress, Lightweight manual wheelchair with seat cushion, Overbed table, Hospital bed, Other see comment (hoyer lift) DME Agency:  Other - Comment ([facility])  HH Arranged:  RN, PT, Nurse's Aide [place] Agency:  [place]  Status of Service:  Completed, signed off  Medicare Important Message Given:    Date Medicare IM Given:    Medicare IM give by:    Date Additional Medicare IM Given:    Additional Medicare Important Message give by:     If discussed at [place] of Stay Meetings, dates discussed:    Additional Comments:  Dessa Phi, RN 12/27/2015, 4:15 PM

## 2015-12-27 NOTE — Care Management Note (Signed)
Case Management Note  Patient Details  Name: Timothy [name] MRN: [id number] Date of Birth: August 23, 1946  Subjective/Objective: faxed w/comfirmation all info to [place] [phone]/fax#[phone]-[facility]-waiting on auth from [facility], prior to delivery of dme to patient's home.[place] aware of [place] orders. Once auth, & dme in home, then patient can transp via non-emergency ambulance to home-CSW already following. MD/Nsg updated.                  Action/Plan:d/c plan home w/HHC/DME.   Expected Discharge Date:   (unknown)               Expected Discharge Plan:  [place]  In-House Referral:     Discharge planning Services  CM Consult  Post Acute Care Choice:    Choice offered to:  Spouse  DME Arranged:  3-N-1, Air overlay mattress, Lightweight manual wheelchair with seat cushion, Overbed table, Hospital bed, Other see comment (hoyer lift) DME Agency:  Other - Comment ([facility])  HH Arranged:  RN, PT, Nurse's Aide [place] Agency:  [place]  Status of Service:  Completed, signed off  Medicare Important Message Given:    Date Medicare IM Given:    Medicare IM give by:    Date Additional Medicare IM Given:    Additional Medicare Important Message give by:     If discussed at [place] of Stay Meetings, dates discussed:    Additional Comments:  Dessa Phi, RN 12/27/2015, 3:33 PM

## 2015-12-27 NOTE — Progress Notes (Signed)
Inpatient Diabetes Program Recommendations  AACE/ADA: New Consensus Statement on Inpatient Glycemic Control (2015)  Target Ranges:  Prepandial:   less than 140 mg/dL      Peak postprandial:   less than 180 mg/dL (1-2 hours)      Critically ill patients:  140 - 180 mg/dL   Review of Glycemic Control  Results for Timothy [name], Timothy [name] (MRN [id number]) as of 12/27/2015 11:00  Ref. Range 12/25/2015 11:26 12/25/2015 16:54 12/25/2015 21:24 12/26/2015 07:39 12/26/2015 11:35 12/26/2015 16:34 12/26/2015 21:35 12/27/2015 07:32  Glucose-Capillary Latest Ref Range: 65-99 mg/dL 194 (H) 189 (H) 221 (H) 194 (H) 197 (H) 202 (H) 157 (H) 133 (H)  Results for Timothy [name], Timothy [name] (MRN [id number]) as of 12/27/2015 11:00  Ref. Range 12/26/2015 03:50 12/27/2015 04:51  Glucose Latest Ref Range: 65-99 mg/dL 192 (H) 175 (H)   Pt to be discharged either this afternoon, or tomorrow morning, to home. Met with wife to discuss intensive diabetes management at home. Wife states pt had occasional low blood sugars at home. Sees Dr. Brigitte Pulse and Tye [place], CDE, for diabetes management.  Home meds: Tresiba 50 units QD and Novolog 10-20 units tidwc. Pt going home on steroids and has been on Lantus 53 units and FBS < 180 mg/dL. Appetite is improving per wife.  Instructed wife to check blood sugars at least 4x/day and whenever he feels low. If FBS > 180 x 3 days, call Cathy at Dr. Raul Del office for adjustments. Discussed hypoglycemia s/s and treatment and wife voiced understanding. Continue with Novolog s/s per Dr. Brigitte Pulse. If blood sugars > 180 mg/dL, call Cathy at Dr. Raul Del office for adjustments.  Will continue to follow while inpatient.  Thank you. Lorenda Peck, RD, LDN, CDE Inpatient Diabetes Coordinator [phone]

## 2015-12-28 ENCOUNTER — Other Ambulatory Visit: Payer: Self-pay | Admitting: *Deleted

## 2015-12-28 ENCOUNTER — Telehealth: Payer: Self-pay | Admitting: *Deleted

## 2015-12-28 DIAGNOSIS — I482 Chronic atrial fibrillation: Secondary | ICD-10-CM

## 2015-12-28 DIAGNOSIS — C9110 Chronic lymphocytic leukemia of B-cell type not having achieved remission: Secondary | ICD-10-CM

## 2015-12-28 DIAGNOSIS — D638 Anemia in other chronic diseases classified elsewhere: Secondary | ICD-10-CM

## 2015-12-28 LAB — RENAL FUNCTION PANEL
ANION GAP: 11 (ref 5–15)
Albumin: 2.1 g/dL — ABNORMAL LOW (ref 3.5–5.0)
BUN: 74 mg/dL — ABNORMAL HIGH (ref 6–20)
CALCIUM: 8.1 mg/dL — AB (ref 8.9–10.3)
CHLORIDE: 110 mmol/L (ref 101–111)
CO2: 23 mmol/L (ref 22–32)
Creatinine, Ser: 1.58 mg/dL — ABNORMAL HIGH (ref 0.61–1.24)
GFR calc non Af Amer: 43 mL/min — ABNORMAL LOW (ref >60)
GFR, EST AFRICAN AMERICAN: 50 mL/min — AB (ref >60)
Glucose, Bld: 207 mg/dL — ABNORMAL HIGH (ref 65–99)
POTASSIUM: 4.4 mmol/L (ref 3.5–5.1)
Phosphorus: 3.6 mg/dL (ref 2.5–4.6)
Sodium: 144 mmol/L (ref 135–145)

## 2015-12-28 LAB — MAGNESIUM: Magnesium: 1.7 mg/dL (ref 1.7–2.4)

## 2015-12-28 LAB — PHOSPHORUS: Phosphorus: 3.7 mg/dL (ref 2.5–4.6)

## 2015-12-28 LAB — GLUCOSE, CAPILLARY: GLUCOSE-CAPILLARY: 140 mg/dL — AB (ref 65–99)

## 2015-12-28 MED ORDER — HEPARIN SOD (PORK) LOCK FLUSH 100 UNIT/ML IV SOLN
500.0000 [IU] | INTRAVENOUS | Status: AC | PRN
  Administered 2015-12-28: 500 [IU]

## 2015-12-28 MED ORDER — TRAMADOL HCL 50 MG PO TABS: 50.0000 mg | ORAL_TABLET | Freq: Four times a day (QID) | ORAL | Status: AC | PRN

## 2015-12-28 MED ORDER — ONDANSETRON 8 MG PO TBDP: 8.0000 mg | ORAL_TABLET | Freq: Three times a day (TID) | ORAL | Status: AC | PRN

## 2015-12-28 MED FILL — SPIRIVA 18 MCG CP-HANDIHALE: 18 | 30 days supply | Qty: 30 | Fill #0

## 2015-12-28 MED FILL — traMADol HCL 50 MG TABS: 50 | 15 days supply | Qty: 60 | Fill #0

## 2015-12-28 MED FILL — SODIUM BICARB 650 MG TABLET: 650 | 15 days supply | Qty: 30 | Fill #0

## 2015-12-28 MED FILL — MONTELUKAST SOD 4 MG TAB CH: 4 | 30 days supply | Qty: 30 | Fill #0

## 2015-12-28 MED FILL — LABETALOL HCL 300 MG TABLET: 300 | 30 days supply | Qty: 60 | Fill #0

## 2015-12-28 MED FILL — PROCTOSOL-HC 2.5% CREAM: 2.5 | 10 days supply | Qty: 28 | Fill #0

## 2015-12-28 MED FILL — TRESIBA FLEXTOUCH 200 UNITS: 200 | 33 days supply | Qty: 9 | Fill #0

## 2015-12-28 MED FILL — PROCHLORPERAZINE 10 MG TAB: 10 | 7 days supply | Qty: 30 | Fill #0

## 2015-12-28 MED FILL — AZITHROMYCIN 250 MG TABLET: 250 | 30 days supply | Qty: 30 | Fill #0

## 2015-12-28 MED FILL — ONDANSETRON ODT 8 MG TABLET: 8 | 15 days supply | Qty: 12 | Fill #0

## 2015-12-28 MED FILL — FUROSEMIDE 80 MG TABLET: 80 | 30 days supply | Qty: 60 | Fill #0

## 2015-12-28 MED FILL — predniSONE 50 MG TABS: 50 | 30 days supply | Qty: 30 | Fill #0

## 2015-12-28 NOTE — Progress Notes (Signed)
Reviewed D/C instructions with patient and wife. Answered all questions, Prescriptions given. Patient will be D/C home with home health and will be transferred via [facility]. Patient in stable condition.

## 2015-12-28 NOTE — Telephone Encounter (Signed)
"  Tiberius was discharged and arrived home via ambulance.  He needs refill on ondansetron."

## 2015-12-28 NOTE — Progress Notes (Signed)
Patient stable for discharge Home health orders placed No changes in medical management since 12/27/2015 Please refer to discharge summary completed 12/27/2015  Leisa Lenz [facility] [phone]

## 2015-12-28 NOTE — Care Management Note (Signed)
Case Management Note  Patient Details  Name: PADDY DENES MRN: [id number] Date of Birth: 06/06/1946  Subjective/Objective:   Spoke to DME co-[facility]. Confirmed all dme arrived to patient's home.Faxed w/confirmation signed overlay gel mattress form. [place] already has [place] orders. Awaiting d/c order.Ambulance transp @ d/c-CSW managing.                Action/Plan:d/c home w/HHC   Expected Discharge Date:   (unknown)               Expected Discharge Plan:  [place]  In-House Referral:     Discharge planning Services  CM Consult  Post Acute Care Choice:    Choice offered to:  Spouse  DME Arranged:  3-N-1, Air overlay mattress, Lightweight manual wheelchair with seat cushion, Overbed table, Hospital bed, Other see comment (hoyer lift) DME Agency:  Other - Comment ([facility])  HH Arranged:  RN, PT, Nurse's Aide [place] Agency:  [place]  Status of Service:  Completed, signed off  Medicare Important Message Given:    Date Medicare IM Given:    Medicare IM give by:    Date Additional Medicare IM Given:    Additional Medicare Important Message give by:     If discussed at [place] of Stay Meetings, dates discussed:    Additional Comments:  Dessa Phi, RN 12/28/2015, 9:40 AM

## 2015-12-28 NOTE — Progress Notes (Signed)
Discharge order in to go home, DME delivered to home. CSW called for transport.    Raynaldo Opitz, [facility] Clinical Social Worker cell #: [phone]

## 2015-12-31 ENCOUNTER — Other Ambulatory Visit: Payer: Self-pay | Admitting: *Deleted

## 2015-12-31 MED ORDER — CALCITRIOL 0.25 MCG PO CAPS: 0.2500 ug | ORAL_CAPSULE | Freq: Every day | ORAL | Status: DC

## 2015-12-31 MED ORDER — CALCITRIOL 0.25 MCG PO CAPS: 0.2500 ug | ORAL_CAPSULE | Freq: Every day | ORAL | Status: AC

## 2015-12-31 MED ORDER — NYSTATIN 100000 UNIT/GM EX POWD: Freq: Four times a day (QID) | CUTANEOUS | Status: AC

## 2015-12-31 MED FILL — CALCITRIOL 0.25 MCG CAPSULE: 0.25 | 30 days supply | Qty: 30 | Fill #0

## 2015-12-31 MED FILL — NYSTOP 100,000 UNITS/GM PWD: 100000 | 5 days supply | Qty: 15 | Fill #0

## 2016-01-03 ENCOUNTER — Emergency Department (HOSPITAL_COMMUNITY): Payer: BC Managed Care – PPO

## 2016-01-03 ENCOUNTER — Inpatient Hospital Stay (HOSPITAL_COMMUNITY): Payer: BC Managed Care – PPO

## 2016-01-03 ENCOUNTER — Encounter (HOSPITAL_COMMUNITY): Payer: Self-pay | Admitting: Nurse Practitioner

## 2016-01-03 ENCOUNTER — Inpatient Hospital Stay (HOSPITAL_COMMUNITY)
Admission: EM | Admit: 2016-01-03 | Discharge: 2016-01-04 | DRG: 638 | Disposition: A | Discharge status: Nursing Home (Includes ICF) | Payer: BC Managed Care – PPO | Attending: Internal Medicine | Admitting: Internal Medicine

## 2016-01-03 DIAGNOSIS — Z856 Personal history of leukemia: Secondary | ICD-10-CM

## 2016-01-03 DIAGNOSIS — T380X5A Adverse effect of glucocorticoids and synthetic analogues, initial encounter: Secondary | ICD-10-CM | POA: Diagnosis present

## 2016-01-03 DIAGNOSIS — E1121 Type 2 diabetes mellitus with diabetic nephropathy: Secondary | ICD-10-CM | POA: Diagnosis present

## 2016-01-03 DIAGNOSIS — Z888 Allergy status to other drugs, medicaments and biological substances status: Secondary | ICD-10-CM

## 2016-01-03 DIAGNOSIS — Z794 Long term (current) use of insulin: Secondary | ICD-10-CM | POA: Diagnosis not present

## 2016-01-03 DIAGNOSIS — E2740 Unspecified adrenocortical insufficiency: Secondary | ICD-10-CM | POA: Diagnosis present

## 2016-01-03 DIAGNOSIS — T8601 Bone marrow transplant rejection: Secondary | ICD-10-CM | POA: Diagnosis not present

## 2016-01-03 DIAGNOSIS — E11649 Type 2 diabetes mellitus with hypoglycemia without coma: Secondary | ICD-10-CM | POA: Diagnosis present

## 2016-01-03 DIAGNOSIS — I248 Other forms of acute ischemic heart disease: Secondary | ICD-10-CM | POA: Diagnosis present

## 2016-01-03 DIAGNOSIS — D638 Anemia in other chronic diseases classified elsewhere: Secondary | ICD-10-CM | POA: Diagnosis present

## 2016-01-03 DIAGNOSIS — G720 Drug-induced myopathy: Secondary | ICD-10-CM | POA: Diagnosis present

## 2016-01-03 DIAGNOSIS — Z792 Long term (current) use of antibiotics: Secondary | ICD-10-CM

## 2016-01-03 DIAGNOSIS — I4891 Unspecified atrial fibrillation: Secondary | ICD-10-CM | POA: Diagnosis present

## 2016-01-03 DIAGNOSIS — R0602 Shortness of breath: Secondary | ICD-10-CM | POA: Diagnosis not present

## 2016-01-03 DIAGNOSIS — R918 Other nonspecific abnormal finding of lung field: Secondary | ICD-10-CM

## 2016-01-03 DIAGNOSIS — R197 Diarrhea, unspecified: Secondary | ICD-10-CM | POA: Diagnosis present

## 2016-01-03 DIAGNOSIS — Z8701 Personal history of pneumonia (recurrent): Secondary | ICD-10-CM

## 2016-01-03 DIAGNOSIS — R609 Edema, unspecified: Secondary | ICD-10-CM | POA: Diagnosis not present

## 2016-01-03 DIAGNOSIS — J90 Pleural effusion, not elsewhere classified: Secondary | ICD-10-CM

## 2016-01-03 DIAGNOSIS — M25559 Pain in unspecified hip: Secondary | ICD-10-CM | POA: Diagnosis present

## 2016-01-03 DIAGNOSIS — D89811 Chronic graft-versus-host disease: Secondary | ICD-10-CM | POA: Diagnosis present

## 2016-01-03 DIAGNOSIS — I129 Hypertensive chronic kidney disease with stage 1 through stage 4 chronic kidney disease, or unspecified chronic kidney disease: Secondary | ICD-10-CM | POA: Diagnosis present

## 2016-01-03 DIAGNOSIS — M818 Other osteoporosis without current pathological fracture: Secondary | ICD-10-CM | POA: Diagnosis present

## 2016-01-03 DIAGNOSIS — Z9484 Stem cells transplant status: Secondary | ICD-10-CM

## 2016-01-03 DIAGNOSIS — M549 Dorsalgia, unspecified: Secondary | ICD-10-CM | POA: Diagnosis present

## 2016-01-03 DIAGNOSIS — N179 Acute kidney failure, unspecified: Secondary | ICD-10-CM | POA: Diagnosis present

## 2016-01-03 DIAGNOSIS — R740 Nonspecific elevation of levels of transaminase and lactic acid dehydrogenase [LDH]: Secondary | ICD-10-CM | POA: Diagnosis present

## 2016-01-03 DIAGNOSIS — F329 Major depressive disorder, single episode, unspecified: Secondary | ICD-10-CM | POA: Diagnosis present

## 2016-01-03 DIAGNOSIS — E1122 Type 2 diabetes mellitus with diabetic chronic kidney disease: Secondary | ICD-10-CM | POA: Diagnosis present

## 2016-01-03 DIAGNOSIS — I4510 Unspecified right bundle-branch block: Secondary | ICD-10-CM | POA: Diagnosis present

## 2016-01-03 DIAGNOSIS — T8609 Other complications of bone marrow transplant: Secondary | ICD-10-CM

## 2016-01-03 DIAGNOSIS — E291 Testicular hypofunction: Secondary | ICD-10-CM | POA: Diagnosis present

## 2016-01-03 DIAGNOSIS — D801 Nonfamilial hypogammaglobulinemia: Secondary | ICD-10-CM | POA: Diagnosis present

## 2016-01-03 DIAGNOSIS — R55 Syncope and collapse: Secondary | ICD-10-CM | POA: Diagnosis present

## 2016-01-03 DIAGNOSIS — IMO0002 Reserved for concepts with insufficient information to code with codable children: Secondary | ICD-10-CM

## 2016-01-03 DIAGNOSIS — Z85828 Personal history of other malignant neoplasm of skin: Secondary | ICD-10-CM | POA: Diagnosis not present

## 2016-01-03 DIAGNOSIS — C9110 Chronic lymphocytic leukemia of B-cell type not having achieved remission: Secondary | ICD-10-CM | POA: Diagnosis present

## 2016-01-03 DIAGNOSIS — R0600 Dyspnea, unspecified: Secondary | ICD-10-CM

## 2016-01-03 DIAGNOSIS — R7401 Elevation of levels of liver transaminase levels: Secondary | ICD-10-CM

## 2016-01-03 DIAGNOSIS — E872 Acidosis: Secondary | ICD-10-CM | POA: Diagnosis present

## 2016-01-03 DIAGNOSIS — I82811 Embolism and thrombosis of superficial veins of right lower extremities: Secondary | ICD-10-CM | POA: Diagnosis present

## 2016-01-03 DIAGNOSIS — Z8572 Personal history of non-Hodgkin lymphomas: Secondary | ICD-10-CM

## 2016-01-03 DIAGNOSIS — E1165 Type 2 diabetes mellitus with hyperglycemia: Secondary | ICD-10-CM

## 2016-01-03 DIAGNOSIS — E46 Unspecified protein-calorie malnutrition: Secondary | ICD-10-CM | POA: Diagnosis present

## 2016-01-03 DIAGNOSIS — I959 Hypotension, unspecified: Secondary | ICD-10-CM | POA: Diagnosis present

## 2016-01-03 DIAGNOSIS — D696 Thrombocytopenia, unspecified: Secondary | ICD-10-CM

## 2016-01-03 DIAGNOSIS — N184 Chronic kidney disease, stage 4 (severe): Secondary | ICD-10-CM | POA: Diagnosis present

## 2016-01-03 DIAGNOSIS — G8929 Other chronic pain: Secondary | ICD-10-CM | POA: Diagnosis present

## 2016-01-03 DIAGNOSIS — E099 Drug or chemical induced diabetes mellitus without complications: Secondary | ICD-10-CM | POA: Diagnosis present

## 2016-01-03 DIAGNOSIS — E669 Obesity, unspecified: Secondary | ICD-10-CM | POA: Diagnosis present

## 2016-01-03 DIAGNOSIS — E162 Hypoglycemia, unspecified: Secondary | ICD-10-CM | POA: Diagnosis present

## 2016-01-03 DIAGNOSIS — E875 Hyperkalemia: Secondary | ICD-10-CM | POA: Diagnosis present

## 2016-01-03 DIAGNOSIS — D899 Disorder involving the immune mechanism, unspecified: Secondary | ICD-10-CM

## 2016-01-03 DIAGNOSIS — Z9489 Other transplanted organ and tissue status: Secondary | ICD-10-CM

## 2016-01-03 DIAGNOSIS — D89813 Graft-versus-host disease, unspecified: Secondary | ICD-10-CM

## 2016-01-03 DIAGNOSIS — D849 Immunodeficiency, unspecified: Secondary | ICD-10-CM | POA: Diagnosis present

## 2016-01-03 LAB — CBG MONITORING, ED
GLUCOSE-CAPILLARY: 48 mg/dL — AB (ref 65–99)
Glucose-Capillary: 37 mg/dL — CL (ref 65–99)
Glucose-Capillary: 50 mg/dL — ABNORMAL LOW (ref 65–99)
Glucose-Capillary: 66 mg/dL (ref 65–99)
Glucose-Capillary: 68 mg/dL (ref 65–99)
Glucose-Capillary: 69 mg/dL (ref 65–99)

## 2016-01-03 LAB — BLOOD GAS, VENOUS
Acid-base deficit: 5.8 mmol/L — ABNORMAL HIGH (ref 0.0–2.0)
Bicarbonate: 17.4 mEq/L — ABNORMAL LOW (ref 20.0–24.0)
FIO2: 0.21
O2 Saturation: 84.8 %
PATIENT TEMPERATURE: 98.6
PO2 VEN: 48.5 mmHg — AB (ref 30.0–45.0)
TCO2: 15.6 mmol/L (ref 0–100)
pCO2, Ven: 28.7 mmHg — ABNORMAL LOW (ref 45.0–50.0)
pH, Ven: 7.4 — ABNORMAL HIGH (ref 7.250–7.300)

## 2016-01-03 LAB — COMPREHENSIVE METABOLIC PANEL
ALK PHOS: 300 U/L — AB (ref 38–126)
ALT: 200 U/L — AB (ref 17–63)
AST: 139 U/L — AB (ref 15–41)
Albumin: 2.4 g/dL — ABNORMAL LOW (ref 3.5–5.0)
Anion gap: 13 (ref 5–15)
BUN: 62 mg/dL — AB (ref 6–20)
CALCIUM: 7.6 mg/dL — AB (ref 8.9–10.3)
CHLORIDE: 114 mmol/L — AB (ref 101–111)
CO2: 19 mmol/L — AB (ref 22–32)
CREATININE: 1.13 mg/dL (ref 0.61–1.24)
GFR calc Af Amer: >60 mL/min (ref >60)
GFR calc non Af Amer: >60 mL/min (ref >60)
GLUCOSE: 49 mg/dL — AB (ref 65–99)
Potassium: 4.1 mmol/L (ref 3.5–5.1)
SODIUM: 146 mmol/L — AB (ref 135–145)
Total Bilirubin: 2.3 mg/dL — ABNORMAL HIGH (ref 0.3–1.2)
Total Protein: 4.8 g/dL — ABNORMAL LOW (ref 6.5–8.1)

## 2016-01-03 LAB — CBC WITH DIFFERENTIAL/PLATELET
BASOS ABS: 0 10*3/uL (ref 0.0–0.1)
Basophils Relative: 0 %
EOS ABS: 0.1 10*3/uL (ref 0.0–0.7)
EOS PCT: 4 %
HCT: 39.5 % (ref 39.0–52.0)
HEMOGLOBIN: 12.4 g/dL — AB (ref 13.0–17.0)
LYMPHS ABS: 0.5 10*3/uL — AB (ref 0.7–4.0)
LYMPHS PCT: 18 %
MCH: 29.6 pg (ref 26.0–34.0)
MCHC: 31.4 g/dL (ref 30.0–36.0)
MCV: 94.3 fL (ref 78.0–100.0)
Monocytes Absolute: 0.1 10*3/uL (ref 0.1–1.0)
Monocytes Relative: 4 %
NEUTROS PCT: 73 %
Neutro Abs: 2 10*3/uL (ref 1.7–7.7)
PLATELETS: 62 10*3/uL — AB (ref 150–400)
RBC: 4.19 MIL/uL — AB (ref 4.22–5.81)
RDW: 18.2 % — ABNORMAL HIGH (ref 11.5–15.5)
WBC: 2.7 10*3/uL — AB (ref 4.0–10.5)

## 2016-01-03 LAB — TROPONIN I
TROPONIN I: 0.16 ng/mL — AB (ref <0.031)
Troponin I: 0.16 ng/mL — ABNORMAL HIGH (ref <0.031)

## 2016-01-03 LAB — I-STAT CG4 LACTIC ACID, ED
LACTIC ACID, VENOUS: 2.44 mmol/L — AB (ref 0.5–2.0)
Lactic Acid, Venous: 1.94 mmol/L (ref 0.5–2.0)

## 2016-01-03 LAB — LIPASE, BLOOD: LIPASE: 33 U/L (ref 11–51)

## 2016-01-03 LAB — BRAIN NATRIURETIC PEPTIDE: B NATRIURETIC PEPTIDE 5: 394.8 pg/mL — AB (ref 0.0–100.0)

## 2016-01-03 MED ORDER — SODIUM BICARBONATE 650 MG PO TABS
650.0000 mg | ORAL_TABLET | Freq: Two times a day (BID) | ORAL | Status: DC
  Administered 2016-01-04 (×2): 650 mg via ORAL
  Filled 2016-01-03 (×3): qty 1

## 2016-01-03 MED ORDER — PREDNISONE 20 MG PO TABS
50.0000 mg | ORAL_TABLET | Freq: Every day | ORAL | Status: DC
  Administered 2016-01-04: 50 mg via ORAL
  Filled 2016-01-03: qty 1

## 2016-01-03 MED ORDER — LOPERAMIDE HCL 2 MG PO CAPS: 2.0000 mg | ORAL_CAPSULE | ORAL | Status: DC | PRN

## 2016-01-03 MED ORDER — CHOLESTYRAMINE 4 G PO PACK
4.0000 g | PACK | Freq: Two times a day (BID) | ORAL | Status: DC
  Administered 2016-01-04 (×2): 4 g via ORAL
  Filled 2016-01-03 (×3): qty 1

## 2016-01-03 MED ORDER — ALBUTEROL SULFATE (2.5 MG/3ML) 0.083% IN NEBU
2.5000 mg | INHALATION_SOLUTION | Freq: Four times a day (QID) | RESPIRATORY_TRACT | Status: DC
  Administered 2016-01-04 (×3): 2.5 mg via RESPIRATORY_TRACT
  Filled 2016-01-03 (×3): qty 3

## 2016-01-03 MED ORDER — DEXTROSE-NACL 5-0.45 % IV SOLN
INTRAVENOUS | Status: DC
  Administered 2016-01-03: 19:00:00 via INTRAVENOUS

## 2016-01-03 MED ORDER — SERTRALINE HCL 50 MG PO TABS: 50.0000 mg | ORAL_TABLET | Freq: Every morning | ORAL | Status: DC

## 2016-01-03 MED ORDER — DEXTROSE 50 % IV SOLN
1.0000 | Freq: Once | INTRAVENOUS | Status: AC
  Administered 2016-01-03: 50 mL via INTRAVENOUS
  Filled 2016-01-03: qty 50

## 2016-01-03 MED ORDER — GLUCERNA SHAKE PO LIQD
237.0000 mL | Freq: Two times a day (BID) | ORAL | Status: DC
  Administered 2016-01-04: 237 mL via ORAL
  Filled 2016-01-03 (×2): qty 237

## 2016-01-03 MED ORDER — PIPERACILLIN-TAZOBACTAM 3.375 G IVPB 30 MIN
3.3750 g | INTRAVENOUS | Status: AC
  Administered 2016-01-03: 3.375 g via INTRAVENOUS
  Filled 2016-01-03: qty 50

## 2016-01-03 MED ORDER — FUROSEMIDE 40 MG PO TABS: 80.0000 mg | ORAL_TABLET | Freq: Two times a day (BID) | ORAL | Status: DC

## 2016-01-03 MED ORDER — ONDANSETRON HCL 4 MG/2ML IJ SOLN: 4.0000 mg | Freq: Four times a day (QID) | INTRAMUSCULAR | Status: DC | PRN

## 2016-01-03 MED ORDER — VANCOMYCIN HCL 10 G IV SOLR
2000.0000 mg | INTRAVENOUS | Status: AC
  Administered 2016-01-03: 2000 mg via INTRAVENOUS
  Filled 2016-01-03: qty 2000

## 2016-01-03 MED ORDER — MONTELUKAST SODIUM 4 MG PO CHEW: 4.0000 mg | CHEWABLE_TABLET | Freq: Every day | ORAL | Status: DC

## 2016-01-03 MED ORDER — HYDROCORTISONE NA SUCCINATE PF 100 MG IJ SOLR
100.0000 mg | Freq: Once | INTRAMUSCULAR | Status: AC
  Administered 2016-01-03: 100 mg via INTRAVENOUS
  Filled 2016-01-03: qty 2

## 2016-01-03 MED ORDER — SODIUM CHLORIDE 0.9 % IV SOLN
1250.0000 mg | INTRAVENOUS | Status: DC
  Filled 2016-01-03: qty 1250

## 2016-01-03 MED ORDER — TIOTROPIUM BROMIDE MONOHYDRATE 18 MCG IN CAPS
18.0000 ug | ORAL_CAPSULE | Freq: Every day | RESPIRATORY_TRACT | Status: DC
  Filled 2016-01-03: qty 5

## 2016-01-03 MED ORDER — SODIUM CHLORIDE 0.9 % IJ SOLN
3.0000 mL | Freq: Two times a day (BID) | INTRAMUSCULAR | Status: DC
  Administered 2016-01-04 (×2): 3 mL via INTRAVENOUS

## 2016-01-03 MED ORDER — PIPERACILLIN-TAZOBACTAM 3.375 G IVPB
3.3750 g | Freq: Three times a day (TID) | INTRAVENOUS | Status: DC
  Administered 2016-01-04 (×2): 3.375 g via INTRAVENOUS
  Filled 2016-01-03 (×2): qty 50

## 2016-01-03 MED ORDER — SUCRALFATE 1 GM/10ML PO SUSP
1.0000 g | Freq: Three times a day (TID) | ORAL | Status: DC
  Administered 2016-01-03 – 2016-01-04 (×3): 1 g via ORAL
  Filled 2016-01-03 (×6): qty 10

## 2016-01-03 MED ORDER — LABETALOL HCL 300 MG PO TABS
300.0000 mg | ORAL_TABLET | Freq: Two times a day (BID) | ORAL | Status: DC
  Administered 2016-01-04: 300 mg via ORAL
  Filled 2016-01-03 (×3): qty 1

## 2016-01-03 MED ORDER — ALBUTEROL SULFATE (2.5 MG/3ML) 0.083% IN NEBU: 2.5000 mg | INHALATION_SOLUTION | RESPIRATORY_TRACT | Status: DC | PRN

## 2016-01-03 MED ORDER — OXYCODONE HCL 5 MG PO TABS: 5.0000 mg | ORAL_TABLET | ORAL | Status: DC | PRN

## 2016-01-03 MED ORDER — ONDANSETRON HCL 4 MG PO TABS: 4.0000 mg | ORAL_TABLET | Freq: Four times a day (QID) | ORAL | Status: DC | PRN

## 2016-01-03 MED ORDER — DILTIAZEM HCL ER COATED BEADS 240 MG PO CP24
240.0000 mg | ORAL_CAPSULE | Freq: Every day | ORAL | Status: DC
  Administered 2016-01-04: 240 mg via ORAL
  Filled 2016-01-03: qty 1

## 2016-01-03 MED ORDER — CALCITRIOL 0.25 MCG PO CAPS
0.2500 ug | ORAL_CAPSULE | Freq: Every day | ORAL | Status: DC
  Administered 2016-01-04: 0.25 ug via ORAL
  Filled 2016-01-03: qty 1

## 2016-01-03 NOTE — ED Notes (Signed)
Bed: [id number] Expected date:  Expected time:  Means of arrival:  Comments: EMS- 70yo M, hypoglycemia/PNA

## 2016-01-03 NOTE — Progress Notes (Signed)
ANTIBIOTIC CONSULT NOTE - INITIAL  Pharmacy Consult for vancomycin, Zosyn Indication: pneumonia  Allergies  Allergen Reactions  . Benadryl [Diphenhydramine Hcl]     "Restless leg syndrome"    Patient Measurements:    Vital Signs: Temp: 97.5 F (36.4 C) (01/12 1446) Temp Source: Oral (01/12 1446) BP: 122/75 mmHg (01/12 1446) Pulse Rate: 63 (01/12 1446) Intake/Output from previous day:   Intake/Output from this shift:    Labs:  Recent Labs  01/03/16 1142  WBC 2.7*  HGB 12.4*  PLT 62*  CREATININE 1.13   Estimated Creatinine Clearance: 71.2 mL/min (by C-G formula based on Cr of 1.13). No results for input(s): VANCOTROUGH, VANCOPEAK, VANCORANDOM, GENTTROUGH, GENTPEAK, GENTRANDOM, TOBRATROUGH, TOBRAPEAK, TOBRARND, AMIKACINPEAK, AMIKACINTROU, AMIKACIN in the last 72 hours.   Microbiology: Recent Results (from the past 720 hour(s))  C difficile quick scan w PCR reflex     Status: None   Collection Time: 12/08/15  3:02 PM  Result Value Ref Range Status   C Diff antigen NEGATIVE NEGATIVE Final   C Diff toxin NEGATIVE NEGATIVE Final   C Diff interpretation Negative for toxigenic C. difficile  Final  AFB culture with smear     Status: None (Preliminary result)   Collection Time: 12/10/15 11:33 AM  Result Value Ref Range Status   Specimen Description BRONCHIAL ALVEOLAR LAVAGE  Final   Special Requests Immunocompromised  Final   Acid Fast Smear   Final    NO ACID FAST BACILLI SEEN Performed at [facility]    Culture   Final    CULTURE WILL BE EXAMINED FOR 6 WEEKS BEFORE ISSUING A FINAL REPORT Performed at [facility]    Report Status PENDING  Incomplete  Culture, bal-quantitative     Status: None   Collection Time: 12/10/15 11:35 AM  Result Value Ref Range Status   Specimen Description BRONCHIAL ALVEOLAR LAVAGE  Final   Special Requests Immunocompromised  Final   Gram Stain   Final    MODERATE WBC PRESENT, PREDOMINANTLY MONONUCLEAR RARE SQUAMOUS  EPITHELIAL CELLS PRESENT NO ORGANISMS SEEN Performed at [place]   Final    35,000 COLONIES/ML Performed at [facility]    Culture   Final    NORMAL OROPHARYNGEAL FLORA Performed at [facility]    Report Status 12/12/2015 FINAL  Final  Fungus Culture with Smear     Status: None (Preliminary result)   Collection Time: 12/10/15 11:38 AM  Result Value Ref Range Status   Specimen Description BRONCHIAL ALVEOLAR LAVAGE  Final   Special Requests Immunocompromised  Final   Fungal Smear   Final    NO YEAST OR FUNGAL ELEMENTS SEEN Performed at [facility]    Culture   Final    CULTURE IN PROGRESS FOR FOUR WEEKS Performed at [facility]    Report Status PENDING  Incomplete  Pneumocystis smear by DFA     Status: None   Collection Time: 12/10/15 11:39 AM  Result Value Ref Range Status   Specimen Source-PJSRC BRONCHIAL ALVEOLAR LAVAGE  Final   Pneumocystis jiroveci Ag NEGATIVE  Final    Comment: Performed at [place] [facility]  Tissue culture     Status: None   Collection Time: 12/10/15 11:40 AM  Result Value Ref Range Status   Specimen Description BIOPSY LUNG TRANSBRONCHIAL  Final   Special Requests Immunocompromised  Final   Gram Stain   Final    NO  WBC SEEN NO ORGANISMS SEEN Performed at [facility]    Culture   Final    RARE STREPTOCOCCUS GROUP D;high probability for S.bovis Note: Gram Stain Report Called to,Read Back By and Verified With: [facility] TORRES 12/10/15 @ 0707P YIMSU Performed at [facility]    Report Status 12/16/2015 FINAL  Final   Organism ID, Bacteria STREPTOCOCCUS GROUP D;high probability for S.bovis  Final      Susceptibility   Streptococcus group d;high probability for s.bovis - MIC (ETEST)*    PENICILLIN <=0.125 SENSITIVE Sensitive     * RARE STREPTOCOCCUS GROUP D;high probability for S.bovis  MRSA PCR Screening     Status: None   Collection Time: 12/12/15  9:46 AM   Result Value Ref Range Status   MRSA by PCR NEGATIVE NEGATIVE Final    Comment:        The GeneXpert MRSA Assay (FDA approved for NASAL specimens only), is one component of a comprehensive MRSA colonization surveillance program. It is not intended to diagnose MRSA infection nor to guide or monitor treatment for MRSA infections.     Medical History: Past Medical History  Diagnosis Date  . 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 Diverticular disease   . Hyperlipidemia   . Obesity   . Hypertension   . Hiatal hernia   . CMV (cytomegalovirus) antibody positive     pre-transplant, with seroconversion x2 pst-transplant  . Right bundle branch block     pre-transplant  . CKD (chronic kidney disease) stage 2, GFR 60-89 ml/min   . Pancytopenia ([place])   . Atrial fibrillation ([place])     post-transplant  . Myopathy   . 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 Diarrhea in adult patient 12/05/2012    Due to active GVHD  . Rash of face 12/05/2012    Due to GVHD  . Hypomagnesemia 01/26/2013  . Left hip pain 12/01/2013  . Steroid-induced diabetes ([place])     novalog  . Leukemia, chronic lymphoid ([place])   . CLL (chronic lymphocytic leukemia) ([place]) 12/05/2012    Dx 07/1999; started Rx 12/06  AlloBMT 3/13    Medications:  Scheduled:  . dextrose  1 ampule Intravenous Once   Infusions:     Assessment: 70 yo male presents to ER with CBG of 37 and febrile. Note patient was just discharged from [facility] on 1/5 after a S. Bovis lung infection completed course of abx's in patient with hx CLL s/p stem cell transplant and chronic GVHD on chronic immunosuppression and with CKD and DM. Plan at discharge was supposed to start sirolimus in preparation for transplant as outpatient. Now to start vancomycin and Zosyn for treatment of HAP. Afeb, WBC 2.7 and SCr 1.1 with CrCl N 62 at baseline. Note that SCr was as high as 3.82 on  previous admission  Goal of Therapy:  Vancomycin trough level 15-20 mcg/ml  Plan:  1) Vancomycin 2g IV x 1 then 1250mg  IV q24 based on current weight and renal function. Note that dosed vanc more conservative based on previous admission renal failure 2) Zosyn 3.375g IV q8 (extended interval infusion) for CrCl > [address] PharmD, [place] Pager [phone] 01/03/2016 3:59 PM

## 2016-01-03 NOTE — ED Notes (Signed)
Timothy [name] (wife) [phone] (cell)

## 2016-01-03 NOTE — Progress Notes (Signed)
Timothy [name] is being re-admitted with significant hypoglycemia presumably due to his long acting antihyperglyceic agents at home. However he has elevated LFTs w/o a clear etiology and he is very SOB.  At this point he tells me he has been SOB all day but it's "much worse right now." Wife Timothy [name] tells me he has been working harder to breathe the last 20 minutes. They have called nurse.  He is satting at 95% but with increased wob and is anxious. I don't hear rales or wheezes anterolaterally.  I am writing for bipap for him and a PCXR. Added carafate as he is c/o epigastric discomfort. He will have to go to stepdown if he stays on bipap.  Greatly appreciate hospitalist and ED staff's help to this patient!

## 2016-01-03 NOTE — Progress Notes (Signed)
[facility] consulted to follow for discharge needs.  Patient noted to have been admitted and discharged from the hospital from 12/15 to 01/05 for respiratory failure.  Per chart review, patient discharged with home health services for RN, PT and aide with [place] home [facility].  Patient was also ordered hospital bed, overlay mattress,wheelchair,overbed table, hoyer lift and 3 in 1.

## 2016-01-03 NOTE — H&P (Signed)
[name] History and Physical  NOBUO [name] IAX:[id number] DOB: 11/20/1946 DOA: 01/03/2016   PCP: Marton Redwood, MD  Specialists: Patient is followed by Dr. Jana Hakim who is his oncologist.  Chief Complaint: Low blood sugars  HPI: Timothy [name] is a 70 y.o. 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 At that time he was admitted for pneumonia due to strep bovis. He underwent bronchoscopy. He was given antibiotics. He completed the course of antibiotics. He also was noted to have acute on chronic kidney disease thought to be due to Prograf. Prograf level was toxic. He was seen by nephrology at that time. Prograf was discontinued. He was also seen by pulmonology and infectious disease. He was admitted initially to the intensive care unit. He was discharged in good condition.  After he went home he did well. He is accompanied by his wife today. He was discharged on Lantus. However, at home he takes [place]. Since taking this medication he noted that his blood sugars were running low in the 70s. And then today the blood sugars dropped into the 30s. Tried taking orange juice without any improvement. He also had a syncopal episode as a result of low blood sugar. Denies any seizure activities. No nausea, vomiting. He has chronic diarrhea as a result of his underlying medical condition. He denies any abdominal pain. No fever, no chills. While waiting in the emergency department he also developed shortness of breath. However, he denies any chest pain. We were called to admit the patient as his blood sugars did not respond to usual treatment.  Home Medications: Prior to Admission medications   Medication Sig Start Date End Date Taking? Authorizing  Provider  azithromycin (ZITHROMAX) 250 MG tablet Take 250 mg once a day for 4 days. 12/27/15  Yes Robbie Lis, MD  calcitRIOL (ROCALTROL) 0.25 MCG capsule Take 1 capsule (0.25 mcg total) by mouth daily. 12/31/15  Yes Chauncey Cruel, MD  cholestyramine Lucrezia Starch) 4 G packet Take 1 packet (4 g total) by mouth 2 (two) times daily. 01/11/15  Yes Laurie Panda, NP  diclofenac sodium (VOLTAREN) 1 % GEL Apply 2 g topically 2 (two) times daily. Patient taking differently: Apply 2 g topically daily.  10/31/15  Yes Chauncey Cruel, MD  diltiazem (CARDIZEM CD) 240 MG 24 hr capsule Take 1 capsule (240 mg total) by mouth daily. 12/21/15  Yes Robbie Lis, MD  feeding supplement, GLUCERNA SHAKE, (GLUCERNA SHAKE) LIQD Take 237 mLs by mouth 2 (two) times daily between meals. 12/21/15  Yes Robbie Lis, MD  fluconazole (DIFLUCAN) 100 MG tablet TAKE 1 TABLET BY MOUTH ONCE DAILY Patient taking differently: TAKE 100 MG BY MOUTH ONCE DAILY 10/11/15  Yes Chauncey Cruel, MD  furosemide (LASIX) 80 MG tablet Take 1 tablet (80 mg total) by mouth 2 (two) times daily. 12/27/15  Yes Robbie Lis, MD  hydrocortisone (ANUSOL-HC) 2.5 % rectal cream Place rectally 3 (three) times daily as needed for hemorrhoids or itching. 12/21/15  Yes Robbie Lis, MD  insulin aspart (NOVOLOG) 100 unit/mL injection Inject into the skin 3 (three) times daily as needed (if blood sugar is over 135, inject 1 unit for every 20 over 135 blood sugar). Sliding scale   Yes Historical Provider, MD  labetalol (NORMODYNE) 300 MG tablet Take 1 tablet (300 mg total) by mouth 2 (two) times daily. 12/21/15  Yes Robbie Lis, MD  lidocaine-prilocaine (EMLA) cream Apply 1 application topically daily as needed (port entry).  10/31/15  Yes Historical Provider, MD  loperamide (IMODIUM) 2 MG capsule Take 1 capsule (2 mg total) by mouth as needed for diarrhea or loose stools. 12/27/15  Yes Robbie Lis, MD  montelukast (SINGULAIR) 4 MG chewable tablet Chew 1  tablet (4 mg total) by mouth at bedtime. 12/27/15  Yes Robbie Lis, MD  nystatin (MYCOSTATIN) powder Apply topically 4 (four) times daily. 12/31/15  Yes Chauncey Cruel, MD  omeprazole (PRILOSEC) 20 MG capsule Take 1 capsule (20 mg total) by mouth daily. 11/28/15  Yes Laurie Panda, NP  ondansetron (ZOFRAN-ODT) 8 MG disintegrating tablet Take 1 tablet (8 mg total) by mouth every 8 (eight) hours as needed for nausea or vomiting. 12/28/15  Yes Chauncey Cruel, MD  predniSONE (DELTASONE) 50 MG tablet Take 1 tablet (50 mg total) by mouth daily with breakfast. 12/27/15  Yes Robbie Lis, MD  prochlorperazine (COMPAZINE) 10 MG tablet Take 1 tablet (10 mg total) by mouth every 6 (six) hours as needed for nausea or vomiting. 12/21/15  Yes Robbie Lis, MD  sertraline (ZOLOFT) 50 MG tablet Take 1 tablet (50 mg total) by mouth every morning. 03/29/15  Yes Chauncey Cruel, MD  sodium bicarbonate 650 MG tablet Take 2 tablets (1,300 mg total) by mouth daily. Patient taking differently: Take 650 mg by mouth 2 (two) times daily.  12/21/15  Yes Robbie Lis, MD  tiotropium (SPIRIVA) 18 MCG inhalation capsule Place 1 capsule (18 mcg total) into inhaler and inhale daily. 12/27/15  Yes Robbie Lis, MD  traMADol (ULTRAM) 50 MG tablet Take 1 tablet (50 mg total) by mouth every 6 (six) hours as needed. 12/28/15  Yes Chauncey Cruel, MD  TRESIBA FLEXTOUCH 200 UNIT/ML SOPN Inject 52 Units as directed daily.  03/26/15  Yes Historical Provider, MD  insulin glargine (LANTUS) 100 UNIT/ML injection Inject 0.53 mLs (53 Units total) into the skin daily. 12/27/15   Robbie Lis, MD  Insulin Pen Needle (B-D UF III MINI PEN NEEDLES) 31G X 5 MM MISC Use five daily with insulin as directed. 04/12/15   Chauncey Cruel, MD  Nutritional Supplements (FEEDING SUPPLEMENT, NEPRO CARB STEADY,) LIQD Take 237 mLs by mouth daily. 12/21/15   Robbie Lis, MD  Nutritional Supplements (FEEDING SUPPLEMENT, NEPRO CARB STEADY,) LIQD Take 237 mLs by  mouth 2 (two) times daily between meals. 12/27/15   Robbie Lis, MD  ONE TOUCH ULTRA TEST test strip TEST BEFORE MEALS AND AT BEDTIME. 06/28/15   Chauncey Cruel, MD  [facility] DELICA LANCETS 84Z MISC USE TO TEST BEFORE MEALS AND AT BEDTIME 03/29/15   Chauncey Cruel, MD    Allergies:  Allergies  Allergen Reactions  . Benadryl [Diphenhydramine Hcl]     "Restless leg syndrome"    Past Medical History: Past Medical History  Diagnosis Date  . 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 Diverticular disease   . Hyperlipidemia   . Obesity   . Hypertension   . Hiatal hernia   . CMV (cytomegalovirus) antibody positive     pre-transplant, with seroconversion x2 pst-transplant  . Right bundle branch block     pre-transplant  . CKD (chronic kidney disease) stage 2, GFR 60-89 ml/min   . Pancytopenia ([place])   .  Atrial fibrillation ([place])     post-transplant  . Myopathy   . 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 Chronic GVHD complicating bone marrow transplantation ([place]) 12/05/2012  . Diarrhea in adult patient 12/05/2012    Due to active GVHD  . Rash of face 12/05/2012    Due to GVHD  . Hypomagnesemia 01/26/2013  . Left hip pain 12/01/2013  . Steroid-induced diabetes ([place])     novalog  . Leukemia, chronic lymphoid ([place])   . CLL (chronic lymphocytic leukemia) ([place]) 12/05/2012    Dx 07/1999; started Rx 12/06  AlloBMT 3/13    Past Surgical History  Procedure Laterality Date  . Tonsillectomy and adenoidectomy    . Bone marrow transplant    . Flexible sigmoidoscopy  11/17/2012    Procedure: FLEXIBLE SIGMOIDOSCOPY;  Surgeon: Jeryl Columbia, MD;  Location: [facility] ENDOSCOPY;  Service: Endoscopy;  Laterality: N/A;  Dr Watt Climes states will be admitted to rooom 1339 11/16/12  . Esophagogastroduodenoscopy  11/17/2012    Procedure: ESOPHAGOGASTRODUODENOSCOPY (EGD);  Surgeon: Jeryl Columbia, MD;  Location: [facility];  Service: Endoscopy;  Laterality: N/A;  . Video bronchoscopy Bilateral 12/10/2015     Procedure: VIDEO BRONCHOSCOPY WITH FLUORO;  Surgeon: Marshell Garfinkel, MD;  Location: [facility] ENDOSCOPY;  Service: Cardiopulmonary;  Laterality: Bilateral;    Social History: He lives in [place] with his wife. No history of smoking, alcohol use currently. He states that ever since his last discharge. She has been unable to ambulate. He does have a living will. But at this point in time. He is once full care.   Family History:  Family History  Problem Relation Age of Onset  . Cancer Father    father had CLL  Review of Systems - History obtained from the patient General ROS: positive for  - fatigue Psychological ROS: negative Ophthalmic ROS: negative ENT ROS: negative Allergy and Immunology ROS: negative Hematological and Lymphatic ROS: negative Endocrine ROS: negative Respiratory ROS: as in hpi Cardiovascular ROS: as in hpi Gastrointestinal ROS: as in hpi Genito-Urinary ROS: no dysuria, trouble voiding, or hematuria Musculoskeletal ROS: negative Neurological ROS: no TIA or stroke symptoms Dermatological ROS: negative  Physical Examination  Filed Vitals:   01/03/16 1117 01/03/16 1251 01/03/16 1316 01/03/16 1446  BP: 108/78 111/79  122/75  Pulse: 53 64  63  Temp:   97.3 F (36.3 C) 97.5 F (36.4 C)  TempSrc:  Other (Comment) Oral Oral  Resp: _0 SpO2: 94% 94%  98%    BP 122/75 mmHg  Pulse 63  Temp(Src) 97.5 F (36.4 C) (Oral)  Resp 18  SpO2 98%  General appearance: alert, cooperative, appears stated age and no distress Head: Normocephalic, without obvious abnormality, atraumatic Eyes: conjunctivae/corneas clear. PERRL, EOM's intact.. Throat: lips, mucosa, and tongue normal; teeth and gums normal Neck: no adenopathy, no carotid bruit, no JVD, supple, symmetrical, trachea midline and thyroid not enlarged, symmetric, no tenderness/mass/nodules Resp: Noted to be to keep neck. Diminished air entry at the bases. No definite crackles. Few scattered wheezes. Cardio:  regular rate and rhythm, S1, S2 normal, no murmur, click, rub or gallop GI: soft, non-tender; bowel sounds normal; no masses,  no organomegaly Extremities: 1-2+ edema bilateral lower extremities Pulses: Poorly palpable in the lower extremities due to edema Lymph nodes: Cervical, supraclavicular, and axillary nodes normal. Neurologic: Awake, alert. No focal neurological deficits. He seems to have generalized weakness, especially in the lower extremities.  Laboratory Data: Results for orders  placed or performed during the hospital encounter of 01/03/16 (from the past 48 hour(s))  POC CBG, ED     Status: Abnormal   Collection Time: 01/03/16 10:27 AM  Result Value Ref Range   Glucose-Capillary 37 (LL) 65 - 99 mg/dL  CBG monitoring, ED     Status: Abnormal   Collection Time: 01/03/16 10:58 AM  Result Value Ref Range   Glucose-Capillary 50 (L) 65 - 99 mg/dL  CBC with Differential     Status: Abnormal   Collection Time: 01/03/16 11:42 AM  Result Value Ref Range   WBC 2.7 (L) 4.0 - 10.5 K/uL   RBC 4.19 (L) 4.22 - 5.81 MIL/uL   Hemoglobin 12.4 (L) 13.0 - 17.0 g/dL   HCT 39.5 39.0 - 52.0 %   MCV 94.3 78.0 - 100.0 fL   MCH 29.6 26.0 - 34.0 pg   MCHC 31.4 30.0 - 36.0 g/dL   RDW 18.2 (H) 11.5 - 15.5 %   Platelets 62 (L) 150 - 400 K/uL    Comment: REPEATED TO VERIFY SPECIMEN CHECKED FOR CLOTS PLATELET COUNT CONFIRMED BY SMEAR    Neutrophils Relative % 73 %   Neutro Abs 2.0 1.7 - 7.7 K/uL   Lymphocytes Relative 18 %   Lymphs Abs 0.5 (L) 0.7 - 4.0 K/uL   Monocytes Relative 4 %   Monocytes Absolute 0.1 0.1 - 1.0 K/uL   Eosinophils Relative 4 %   Eosinophils Absolute 0.1 0.0 - 0.7 K/uL   Basophils Relative 0 %   Basophils Absolute 0.0 0.0 - 0.1 K/uL  Comprehensive metabolic panel     Status: Abnormal   Collection Time: 01/03/16 11:42 AM  Result Value Ref Range   Sodium 146 (H) 135 - 145 mmol/L   Potassium 4.1 3.5 - 5.1 mmol/L   Chloride 114 (H) 101 - 111 mmol/L   CO2 19 (L) 22 - 32  mmol/L   Glucose, Bld 49 (L) 65 - 99 mg/dL   BUN 62 (H) 6 - 20 mg/dL   Creatinine, Ser 1.13 0.61 - 1.24 mg/dL   Calcium 7.6 (L) 8.9 - 10.3 mg/dL   Total Protein 4.8 (L) 6.5 - 8.1 g/dL   Albumin 2.4 (L) 3.5 - 5.0 g/dL   AST 139 (H) 15 - 41 U/L   ALT 200 (H) 17 - 63 U/L   Alkaline Phosphatase 300 (H) 38 - 126 U/L   Total Bilirubin 2.3 (H) 0.3 - 1.2 mg/dL   GFR calc non Af Amer >60 >60 mL/min   GFR calc Af Amer >60 >60 mL/min    Comment: (NOTE) The eGFR has been calculated using the CKD EPI equation. This calculation has not been validated in all clinical situations. eGFR's persistently <60 mL/min signify possible Chronic Kidney Disease.    Anion gap 13 5 - 15  CBG monitoring, ED     Status: None   Collection Time: 01/03/16 12:50 PM  Result Value Ref Range   Glucose-Capillary 66 65 - 99 mg/dL  CBG monitoring, ED     Status: None   Collection Time: 01/03/16  2:18 PM  Result Value Ref Range   Glucose-Capillary 68 65 - 99 mg/dL  CBG monitoring, ED     Status: Abnormal   Collection Time: 01/03/16  3:24 PM  Result Value Ref Range   Glucose-Capillary 48 (L) 65 - 99 mg/dL  I-Stat CG4 Lactic Acid, ED     Status: None   Collection Time: 01/03/16  4:36 PM  Result Value Ref  Range   Lactic Acid, Venous 1.94 0.5 - 2.0 mmol/L  Blood gas, venous     Status: Abnormal   Collection Time: 01/03/16  4:37 PM  Result Value Ref Range   FIO2 0.21    Delivery systems ROOM AIR    pH, Ven 7.400 (H) 7.250 - 7.300   pCO2, Ven 28.7 (L) 45.0 - 50.0 mmHg   pO2, Ven 48.5 (H) 30.0 - 45.0 mmHg   Bicarbonate 17.4 (L) 20.0 - 24.0 mEq/L   TCO2 15.6 0 - 100 mmol/L   Acid-base deficit 5.8 (H) 0.0 - 2.0 mmol/L   O2 Saturation 84.8 %   Patient temperature 98.6    Collection site VEIN    Drawn by DRAWN BY RN    Sample type VEIN    *Note: Due to a large number of results and/or encounters for the requested time period, some results have not been displayed. A complete set of results can be found in Results  Review.    Radiology Reports: Dg Chest 2 View  01/03/2016  CLINICAL DATA:  Shortness of breath with diaphoresis and disorientation. EXAM: CHEST  2 VIEW COMPARISON:  Radiographs 12/17/2015.  CT 12/25/2015. FINDINGS: Right IJ Port-A-Cath tip extends to the SVC right atrial level. The heart size and mediastinal contours are stable. There are persistent low lung volumes with moderate size bilateral pleural effusions and bibasilar atelectasis. No progressive airspace disease or pneumothorax identified. The bones appear unchanged. IMPRESSION: No significant change in cardiomegaly, pleural effusions and bibasilar atelectasis. No acute findings. Electronically Signed   By: Richardean Sale M.D.   On: 01/03/2016 15:29   US Abdomen Limited Ruq  01/03/2016  CLINICAL DATA:  Elevated liver enzymes EXAM: US ABDOMEN LIMITED - RIGHT UPPER QUADRANT COMPARISON:  CT abdomen and pelvis February 19, 2015 FINDINGS: Gallbladder: No gallstones are apparent. The gallbladder wall is thickened with a measured wall thickness of 5 mm. The gallbladder wall does not appear edematous, and there is no pericholecystic fluid. No sonographic Murphy sign noted by sonographer. Common bile duct: Diameter: 6 mm. There is no intrahepatic or extrahepatic biliary duct dilatation. Liver: No focal lesion identified. Within normal limits in parenchymal echogenicity. There is ascites as well as a right pleural effusion. IMPRESSION: There is ascites as well as a right pleural effusion. Gallbladder wall is thickened. Gallbladder wall thickening may be seen with ascites. The possibility of a degree of acalculus cholecystitis is difficult to exclude in this circumstance. Note that there is no pericholecystic fluid or edema in the gallbladder wall. It may be prudent to consider correlation with nuclear medicine hepatobiliary imaging study to confirm cystic duct patency. Electronically Signed   By: Lowella Grip III M.D.   On: 01/03/2016 13:51    My  interpretation of Electrocardiogram: Sinus rhythm in the 60s. Left axis deviation. RBBB. Similar to previous EKG.  Problem List  Principal Problem:   Transaminitis Active Problems:   Transplant recipient   Immunocompromised ([place])   Chronic GVHD complicating bone marrow transplantation (H[facility])   CLL (chronic lymphocytic leukemia) (HCC)   Steroid-induced diabetes mellitus (HCC)   Shortness of breath   Uncontrolled type 2 diabetes mellitus with diabetic nephropathy, with long-term current use of insulin (HCC)   Hypoglycemia   Assessment: This is a 70 year old Caucasian male with a complicated past medical history with complex active medical problems presents with hypoglycemia. He takes long-acting insulin at home. He is also found to have transaminitis. He was also noted to have thrombocytopenia.  Plan: #  1 Hypoglycemia in the setting of steroid induced diabetes, on long-acting insulin: No recent HbA1c. This will be ordered for tomorrow morning. Low blood sugarsare likely a result of accumulation of his long-acting insulin. Unclear how elevated liver functions are contributing to this picture. He will be placed on a D5 infusion for now. Insulin will be held. CBGs will be monitored closely.  #2 Transaminitis: He is noted to have elevated AST, ALT and bilirubin. Ultrasound suggested that Gb wall abnormalities. However, patient is asymptomatic. He does not have any right upper quadrant tenderness. Etiology for his elevated LFTs are not clear. We will check hepatitis panel. Repeat LFTs tomorrow. Ordered Doppler studies of his hepatic and portal vasculature. Ultrasound does not reveal any focal lesions in the liver.  #3 Thrombocytopenia: Etiology is unclear. Discussed with Dr. Jana Hakim. He will evaluate the patient. Repeat tomorrow morning. Avoid heparin products.  #4 Dyspnea: Chest x-ray findings are similar to previous. Dyspnea could be due to metabolic derangements. Patient has been started on  broad-spectrum antibiotics due to recent hospitalization and abnormal chest x-ray. Continue for now. Await culture data. Monitor closely in the step down unit considering his recent ICU stay. Mildly elevated troponin as noted. Patient denies any chest pain. This will be repeated. De-escalate antibiotics if cultures are unremarkable.  #5 Recent pneumonia with strep bovis: He has completed course of antibiotics. See above.  #6 history of CLL, status post transplant with chronic GVHD on immunosuppressants: He is currently not on Prograf due to recent Prograf related renal toxicity. He is on prednisone, which will be continued for now. His oncologist will follow.  #7 Recent acute on chronic kidney disease stage IV: His creatinine is much improved compared to recent hospitalization. He is noted to have metabolic acidosis. Anion gap is normal. Continue with the sodium bicarbonate. Repeat labs tomorrow morning. Monitor urine output.  #8 Chronic diarrhea due to GVHD: Stable.  #9 history of depression: Continue Zoloft.  #10 History of atrial fibrillation: Patient is not on anticoagulation due to history of thrombocytopenia. Chads 2 vascular score is at least 3. Rate control with Cardizem and labetalol which will be continued.  #11 Anemia of chronic disease: Continue to monitor hemoglobin.  DVT Prophylaxis: SCDs Code Status: Full code. This was discussed with the patient and his wife Family Communication: Discussed with the patient and his wife  Disposition Plan: Admit to stepdown for tonight.   Further management decisions will depend on results of further testing and patient's response to treatment.   [facility]  [name] Pager [phone]  If 7PM-7AM, please contact night-coverage [email or web address] Password [facility]  01/03/2016, 5:05 PM

## 2016-01-03 NOTE — ED Notes (Signed)
CBG was 37 onsite, patient disoriented, diaphoretic, received 25 grams of D50, CBG 153, then repeated to 69. Febrile, actively treated for right sided pneumonia with decreased breath sounds but no respiratory distress. On lasix for fluid accumulation which is new for patient. Mild complaints of nausea.

## 2016-01-03 NOTE — ED Provider Notes (Signed)
CSN: [id number]     Arrival date & time 01/03/16  [id number] History   First MD Initiated Contact with Patient 01/03/16 1004     Chief Complaint  Patient presents with  . Hypoglycemia     (Consider location/radiation/quality/duration/timing/severity/associated sxs/prior Treatment) HPI Comments: Recently in hospital, ICU with discharge home. They had recommended SNF however pt reports he was too complicated and they were having difficulty finding a facility to accept him, so he went home.  Reports he had been feeling better slowly, back to normal until this AM. No fevers/cough/SOB, eating improving Taking 53 of lantus, steroid decreased, took dose however did not have time to eat breakfast before he began to feel ill this AM Felt like had a fist in pit of stomach, nausea, then wife found him unresponsive in bed EMS found glucose of 39, gave D50, with improvement to 100s initially and decrease to 50s Feeling back to normal now, wanting juice No abdominal pain after that brief feeling of a "knot" fist epigastric 4/10 in pain  While in ED beginning to feel breathing "more labored", no chest pain, mild initally then becoming moderate   Past Medical History  Diagnosis Date  . 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 Diverticular disease   . Hyperlipidemia   . Obesity   . Hypertension   . Hiatal hernia   . CMV (cytomegalovirus) antibody positive     pre-transplant, with seroconversion x2 pst-transplant  . Right bundle branch block     pre-transplant  . CKD (chronic kidney disease) stage 2, GFR 60-89 ml/min   . Pancytopenia ([place])   . Atrial fibrillation ([place])     post-transplant  . Myopathy   . 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 Chronic GVHD complicating bone marrow transplantation ([place]) 12/05/2012  . Diarrhea in adult patient 12/05/2012    Due to active GVHD  . Rash of face 12/05/2012    Due to GVHD  . Hypomagnesemia 01/26/2013  . Left hip pain 12/01/2013  . Steroid-induced  diabetes ([place])     novalog  . Leukemia, chronic lymphoid ([place])   . CLL (chronic lymphocytic leukemia) ([place]) 12/05/2012    Dx 07/1999; started Rx 12/06  AlloBMT 3/13   Past Surgical History  Procedure Laterality Date  . Tonsillectomy and adenoidectomy    . Bone marrow transplant    . Flexible sigmoidoscopy  11/17/2012    Procedure: FLEXIBLE SIGMOIDOSCOPY;  Surgeon: Jeryl Columbia, MD;  Location: [facility] END[facility]COPY;  Service: Endoscopy;  Laterality: N/A;  Dr Watt Climes states will be admitted to rooom 1339 11/16/12  . Esophagogastroduodenoscopy  11/17/2012    Procedure: ESOPHAGOGASTRODUODENOSCOPY (EGD);  Surgeon: Jeryl Columbia, MD;  Location: [facility];  Service: Endoscopy;  Laterality: N/A;  . Video bronchoscopy Bilateral 12/10/2015    Procedure: VIDEO BRONCHOSCOPY WITH FLUORO;  Surgeon: Marshell Garfinkel, MD;  Location: [facility] ENDOSCOPY;  Service: Cardiopulmonary;  Laterality: Bilateral;   Family History  Problem Relation Age of Onset  . Cancer Father    Social History  Substance Use Topics  . Smoking status: Never Smoker   . Smokeless tobacco: Never Used  . Alcohol Use: No    Review of Systems  Constitutional: Negative for fever.  HENT: Negative for sore throat.   Eyes: Negative for visual disturbance.  Respiratory: Positive for shortness of breath (initially denied, however began to have more labored breathing while in ED).   Cardiovascular: Negative for chest pain.  Gastrointestinal: Positive for nausea. Negative for vomiting, abdominal  pain (brief episode of "knot" in the stomach this AM) and diarrhea.  Genitourinary: Negative for difficulty urinating.  Musculoskeletal: Negative for back pain and neck stiffness.  Skin: Negative for rash.  Neurological: Negative for syncope and headaches.      Allergies  Benadryl  Home Medications   Prior to Admission medications   Medication Sig Start Date End Date Taking? Authorizing Provider  azithromycin (ZITHROMAX) 250 MG tablet Take 250 mg  once a day for 4 days. 12/27/15  Yes Robbie Lis, MD  calcitRIOL (ROCALTROL) 0.25 MCG capsule Take 1 capsule (0.25 mcg total) by mouth daily. 12/31/15  Yes Chauncey Cruel, MD  cholestyramine Lucrezia Starch) 4 G packet Take 1 packet (4 g total) by mouth 2 (two) times daily. 01/11/15  Yes Laurie Panda, NP  diclofenac sodium (VOLTAREN) 1 % GEL Apply 2 g topically 2 (two) times daily. Patient taking differently: Apply 2 g topically daily.  10/31/15  Yes Chauncey Cruel, MD  diltiazem (CARDIZEM CD) 240 MG 24 hr capsule Take 1 capsule (240 mg total) by mouth daily. 12/21/15  Yes Robbie Lis, MD  feeding supplement, GLUCERNA SHAKE, (GLUCERNA SHAKE) LIQD Take 237 mLs by mouth 2 (two) times daily between meals. 12/21/15  Yes Robbie Lis, MD  fluconazole (DIFLUCAN) 100 MG tablet TAKE 1 TABLET BY MOUTH ONCE DAILY Patient taking differently: TAKE 100 MG BY MOUTH ONCE DAILY 10/11/15  Yes Chauncey Cruel, MD  furosemide (LASIX) 80 MG tablet Take 1 tablet (80 mg total) by mouth 2 (two) times daily. 12/27/15  Yes Robbie Lis, MD  hydrocortisone (ANUSOL-HC) 2.5 % rectal cream Place rectally 3 (three) times daily as needed for hemorrhoids or itching. 12/21/15  Yes Robbie Lis, MD  insulin aspart (NOVOLOG) 100 unit/mL injection Inject into the skin 3 (three) times daily as needed (if blood sugar is over 135, inject 1 unit for every 20 over 135 blood sugar). Sliding scale   Yes Historical Provider, MD  labetalol (NORMODYNE) 300 MG tablet Take 1 tablet (300 mg total) by mouth 2 (two) times daily. 12/21/15  Yes Robbie Lis, MD  lidocaine-prilocaine (EMLA) cream Apply 1 application topically daily as needed (port entry).  10/31/15  Yes Historical Provider, MD  loperamide (IMODIUM) 2 MG capsule Take 1 capsule (2 mg total) by mouth as needed for diarrhea or loose stools. 12/27/15  Yes Robbie Lis, MD  montelukast (SINGULAIR) 4 MG chewable tablet Chew 1 tablet (4 mg total) by mouth at bedtime. 12/27/15  Yes Robbie Lis, MD  nystatin (MYCOSTATIN) powder Apply topically 4 (four) times daily. 12/31/15  Yes Chauncey Cruel, MD  omeprazole (PRILOSEC) 20 MG capsule Take 1 capsule (20 mg total) by mouth daily. 11/28/15  Yes Laurie Panda, NP  ondansetron (ZOFRAN-ODT) 8 MG disintegrating tablet Take 1 tablet (8 mg total) by mouth every 8 (eight) hours as needed for nausea or vomiting. 12/28/15  Yes Chauncey Cruel, MD  predniSONE (DELTASONE) 50 MG tablet Take 1 tablet (50 mg total) by mouth daily with breakfast. 12/27/15  Yes Robbie Lis, MD  prochlorperazine (COMPAZINE) 10 MG tablet Take 1 tablet (10 mg total) by mouth every 6 (six) hours as needed for nausea or vomiting. 12/21/15  Yes Robbie Lis, MD  sertraline (ZOLOFT) 50 MG tablet Take 1 tablet (50 mg total) by mouth every morning. 03/29/15  Yes Chauncey Cruel, MD  sodium bicarbonate 650 MG tablet Take 2 tablets (1,300 mg total)  by mouth daily. Patient taking differently: Take 650 mg by mouth 2 (two) times daily.  12/21/15  Yes Robbie Lis, MD  tiotropium (SPIRIVA) 18 MCG inhalation capsule Place 1 capsule (18 mcg total) into inhaler and inhale daily. 12/27/15  Yes Robbie Lis, MD  traMADol (ULTRAM) 50 MG tablet Take 1 tablet (50 mg total) by mouth every 6 (six) hours as needed. 12/28/15  Yes Chauncey Cruel, MD  TRESIBA FLEXTOUCH 200 UNIT/ML SOPN Inject 52 Units as directed daily.  03/26/15  Yes Historical Provider, MD  insulin glargine (LANTUS) 100 UNIT/ML injection Inject 0.53 mLs (53 Units total) into the skin daily. 12/27/15   Robbie Lis, MD  Insulin Pen Needle (B-D UF III MINI PEN NEEDLES) 31G X 5 MM MISC Use five daily with insulin as directed. 04/12/15   Chauncey Cruel, MD  Nutritional Supplements (FEEDING SUPPLEMENT, NEPRO CARB STEADY,) LIQD Take 237 mLs by mouth daily. 12/21/15   Robbie Lis, MD  Nutritional Supplements (FEEDING SUPPLEMENT, NEPRO CARB STEADY,) LIQD Take 237 mLs by mouth 2 (two) times daily between meals. 12/27/15   Robbie Lis, MD  ONE TOUCH ULTRA TEST test strip TEST BEFORE MEALS AND AT BEDTIME. 06/28/15   Chauncey Cruel, MD  [facility] DELICA LANCETS [id number] MISC USE TO TEST BEFORE MEALS AND AT BEDTIME 03/29/15   Chauncey Cruel, MD   BP 122/75 mmHg  Pulse 63  Temp(Src) 97.5 F (36.4 C) (Oral)  Resp 18  SpO2 98% Physical Exam  Constitutional: He is oriented to person, place, and time. He appears well-developed and well-nourished. He appears ill. No distress.  HENT:  Head: Normocephalic and atraumatic.  Eyes: Conjunctivae and EOM are normal.  Neck: Normal range of motion.  Cardiovascular: Normal rate, regular rhythm, normal heart sounds and intact distal pulses.  Exam reveals no gallop and no friction rub.   No murmur heard. Pulmonary/Chest: Effort normal and breath sounds normal. Tachypnea noted. No respiratory distress. He has no wheezes. He has no rales.  Abdominal: Soft. He exhibits no distension. There is no tenderness. There is no guarding.  Musculoskeletal: He exhibits edema.  Neurological: He is alert and oriented to person, place, and time.  Skin: Skin is warm. He is diaphoretic.  Nursing note and vitals reviewed.   ED Course  Procedures (including critical care time) Labs Review Labs Reviewed  CBC WITH DIFFERENTIAL/PLATELET - Abnormal; Notable for the following:    WBC 2.7 (*)    RBC 4.19 (*)    Hemoglobin 12.4 (*)    RDW 18.2 (*)    Platelets 62 (*)    Lymphs Abs 0.5 (*)    All other components within normal limits  COMPREHENSIVE METABOLIC PANEL - Abnormal; Notable for the following:    Sodium 146 (*)    Chloride 114 (*)    CO2 19 (*)    Glucose, Bld 49 (*)    BUN 62 (*)    Calcium 7.6 (*)    Total Protein 4.8 (*)    Albumin 2.4 (*)    AST 139 (*)    ALT 200 (*)    Alkaline Phosphatase 300 (*)    Total Bilirubin 2.3 (*)    All other components within normal limits  CBG MONITORING, ED - Abnormal; Notable for the following:    Glucose-Capillary 37 (*)    All other  components within normal limits  CBG MONITORING, ED - Abnormal; Notable for the following:    Glucose-Capillary 50 (*)  All other components within normal limits  CBG MONITORING, ED - Abnormal; Notable for the following:    Glucose-Capillary 48 (*)    All other components within normal limits  CULTURE, BLOOD (ROUTINE X 2)  CULTURE, BLOOD (ROUTINE X 2)  LIPASE, BLOOD  TROPONIN I  BRAIN NATRIURETIC PEPTIDE  CBG MONITORING, ED  CBG MONITORING, ED  I-STAT VENOUS BLOOD GAS, ED  I-STAT CG4 LACTIC ACID, ED    Imaging Review Dg Chest 2 View  01/03/2016  CLINICAL DATA:  Shortness of breath with diaphoresis and disorientation. EXAM: CHEST  2 VIEW COMPARISON:  Radiographs 12/17/2015.  CT 12/25/2015. FINDINGS: Right IJ Port-A-Cath tip extends to the SVC right atrial level. The heart size and mediastinal contours are stable. There are persistent low lung volumes with moderate size bilateral pleural effusions and bibasilar atelectasis. No progressive airspace disease or pneumothorax identified. The bones appear unchanged. IMPRESSION: No significant change in cardiomegaly, pleural effusions and bibasilar atelectasis. No acute findings. Electronically Signed   By: Richardean Sale M.D.   On: 01/03/2016 15:29   US Abdomen Limited Ruq  01/03/2016  CLINICAL DATA:  Elevated liver enzymes EXAM: US ABDOMEN LIMITED - RIGHT UPPER QUADRANT COMPARISON:  CT abdomen and pelvis February 19, 2015 FINDINGS: Gallbladder: No gallstones are apparent. The gallbladder wall is thickened with a measured wall thickness of 5 mm. The gallbladder wall does not appear edematous, and there is no pericholecystic fluid. No sonographic Murphy sign noted by sonographer. Common bile duct: Diameter: 6 mm. There is no intrahepatic or extrahepatic biliary duct dilatation. Liver: No focal lesion identified. Within normal limits in parenchymal echogenicity. There is ascites as well as a right pleural effusion. IMPRESSION: There is ascites as  well as a right pleural effusion. Gallbladder wall is thickened. Gallbladder wall thickening may be seen with ascites. The possibility of a degree of acalculus cholecystitis is difficult to exclude in this circumstance. Note that there is no pericholecystic fluid or edema in the gallbladder wall. It may be prudent to consider correlation with nuclear medicine hepatobiliary imaging study to confirm cystic duct patency. Electronically Signed   By: Lowella Grip III M.D.   On: 01/03/2016 13:51   I have personally reviewed and evaluated these images and lab results as part of my medical decision-making.   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 Patient reported she he had been unable to eat this morning as he was just waking up, and began to feel nausea and a feeling like a "knot in my stomach."  He was then unresponsive and his wife called EMS who discovered his glucose to be 37.  Pt  given D50 with EMS with improvement in glucose to 100s and then 50, and alert and oriented on arrival to the  ED, however recheck glucose in ED was 37.  Patient able to tolerate fluids, food, and felt back to baseline initially, glucose improved to 60.  However, after eating and drinking, patient glucose again decreased to 40s. Pt given amp D50 and started on D5 gtt given persistent hypoglycemia.  Pt steroid decreased to 50 after hospitalization which may contribute to hypoglycemia and pt given steroid IV in ED.   Given initial sensation of "knot" in stomach and complex medical history, CMP and CBC were ordered which were significant for a new elevation in transaminases alkaline phosphatase and bilirubin.  Patient with mild leukopenia which has been present previously. Bicarb 19 with no anion gap. Patient's abdominal exam is benign, however given elevation and discomfort described earlier today, RUQ [name] was ordered which showed ascites, right pleural effusion , GB wall thickening possibly from ascites, however cannot rule out acalculous cholecystitis.  Given no acute pain at this time will allow inpt team to decide whether further evaluation of acalculous cholecystitis is indicated pending pt abd pain.  Other etiologies of his transaminitis could be graft versus host.   While in the emergency department, patient began to have shortness of breath, similar to prior admission however less severe.  EKG overall unchanged from prior.  CXR shows right pleural effusion, concern for right sided opacity, and ordered blood cultures, lactic acid, and abx. Per radiology, effusion/opacity similar, however on my evaluation diaphragm appears more obscured, and patient has tachypnea 24, and report of saturation to 90% prior to nursing placing him on 3LpNC.  BNP, troponin, blood gas ordered and pending.  Do not feel PE evaluation in ED indicated at this time given recent work up for similar, pleural effusions and GVHD as more likely etiologies of dyspnea.  Pt to be admitted to hospitalist for further care of persistent hypoglycemia,  tachypnea (possible HCAP, effusion, or worsening GVHD), and transaminitis.   Gareth Morgan, MD 01/03/16 [phone]

## 2016-01-04 ENCOUNTER — Inpatient Hospital Stay (HOSPITAL_COMMUNITY): Payer: BC Managed Care – PPO

## 2016-01-04 DIAGNOSIS — C9110 Chronic lymphocytic leukemia of B-cell type not having achieved remission: Secondary | ICD-10-CM

## 2016-01-04 DIAGNOSIS — R0602 Shortness of breath: Secondary | ICD-10-CM

## 2016-01-04 DIAGNOSIS — R740 Nonspecific elevation of levels of transaminase and lactic acid dehydrogenase [LDH]: Secondary | ICD-10-CM

## 2016-01-04 DIAGNOSIS — D696 Thrombocytopenia, unspecified: Secondary | ICD-10-CM | POA: Insufficient documentation

## 2016-01-04 DIAGNOSIS — Z9489 Other transplanted organ and tissue status: Secondary | ICD-10-CM

## 2016-01-04 DIAGNOSIS — D801 Nonfamilial hypogammaglobulinemia: Secondary | ICD-10-CM

## 2016-01-04 DIAGNOSIS — R0600 Dyspnea, unspecified: Secondary | ICD-10-CM

## 2016-01-04 DIAGNOSIS — N189 Chronic kidney disease, unspecified: Secondary | ICD-10-CM

## 2016-01-04 DIAGNOSIS — R609 Edema, unspecified: Secondary | ICD-10-CM

## 2016-01-04 DIAGNOSIS — D89811 Chronic graft-versus-host disease: Secondary | ICD-10-CM

## 2016-01-04 LAB — GLUCOSE, CAPILLARY
GLUCOSE-CAPILLARY: 80 mg/dL (ref 65–99)
GLUCOSE-CAPILLARY: 65 mg/dL (ref 65–99)
Glucose-Capillary: 64 mg/dL — ABNORMAL LOW (ref 65–99)
Glucose-Capillary: 94 mg/dL (ref 65–99)

## 2016-01-04 LAB — CBC
HEMATOCRIT: 40.3 % (ref 39.0–52.0)
Hemoglobin: 12.7 g/dL — ABNORMAL LOW (ref 13.0–17.0)
MCH: 29.7 pg (ref 26.0–34.0)
MCHC: 31.5 g/dL (ref 30.0–36.0)
MCV: 94.4 fL (ref 78.0–100.0)
PLATELETS: 38 10*3/uL — AB (ref 150–400)
RBC: 4.27 MIL/uL (ref 4.22–5.81)
RDW: 18.5 % — AB (ref 11.5–15.5)
WBC: 1.8 10*3/uL — AB (ref 4.0–10.5)

## 2016-01-04 LAB — CBG MONITORING, ED
GLUCOSE-CAPILLARY: 44 mg/dL — AB (ref 65–99)
GLUCOSE-CAPILLARY: 121 mg/dL — AB (ref 65–99)
GLUCOSE-CAPILLARY: 76 mg/dL (ref 65–99)
GLUCOSE-CAPILLARY: 59 mg/dL — AB (ref 65–99)
Glucose-Capillary: 35 mg/dL — CL (ref 65–99)
Glucose-Capillary: 123 mg/dL — ABNORMAL HIGH (ref 65–99)
Glucose-Capillary: 102 mg/dL — ABNORMAL HIGH (ref 65–99)

## 2016-01-04 LAB — COMPREHENSIVE METABOLIC PANEL
ALT: 185 U/L — ABNORMAL HIGH (ref 17–63)
AST: 121 U/L — AB (ref 15–41)
Albumin: 2.1 g/dL — ABNORMAL LOW (ref 3.5–5.0)
Alkaline Phosphatase: 307 U/L — ABNORMAL HIGH (ref 38–126)
Anion gap: 12 (ref 5–15)
BILIRUBIN TOTAL: 2 mg/dL — AB (ref 0.3–1.2)
BUN: 69 mg/dL — AB (ref 6–20)
CO2: 16 mmol/L — ABNORMAL LOW (ref 22–32)
Calcium: 7 mg/dL — ABNORMAL LOW (ref 8.9–10.3)
Chloride: 111 mmol/L (ref 101–111)
Creatinine, Ser: 1.61 mg/dL — ABNORMAL HIGH (ref 0.61–1.24)
GFR, EST AFRICAN AMERICAN: 49 mL/min — AB (ref >60)
GFR, EST NON AFRICAN AMERICAN: 42 mL/min — AB (ref >60)
Glucose, Bld: 129 mg/dL — ABNORMAL HIGH (ref 65–99)
POTASSIUM: 5.3 mmol/L — AB (ref 3.5–5.1)
Sodium: 139 mmol/L (ref 135–145)
TOTAL PROTEIN: 4.3 g/dL — AB (ref 6.5–8.1)

## 2016-01-04 LAB — BASIC METABOLIC PANEL
Anion gap: 11 (ref 5–15)
BUN: 68 mg/dL — AB (ref 6–20)
CHLORIDE: 112 mmol/L — AB (ref 101–111)
CO2: 17 mmol/L — AB (ref 22–32)
Calcium: 6.5 mg/dL — ABNORMAL LOW (ref 8.9–10.3)
Creatinine, Ser: 1.49 mg/dL — ABNORMAL HIGH (ref 0.61–1.24)
GFR calc Af Amer: 53 mL/min — ABNORMAL LOW (ref >60)
GFR, EST NON AFRICAN AMERICAN: 46 mL/min — AB (ref >60)
Glucose, Bld: 164 mg/dL — ABNORMAL HIGH (ref 65–99)
Potassium: 4.5 mmol/L (ref 3.5–5.1)
SODIUM: 140 mmol/L (ref 135–145)

## 2016-01-04 LAB — LACTIC ACID, PLASMA
Lactic Acid, Venous: 2.8 mmol/L (ref 0.5–2.0)
Lactic Acid, Venous: 2.6 mmol/L (ref 0.5–2.0)
Lactic Acid, Venous: 2.2 mmol/L (ref 0.5–2.0)

## 2016-01-04 LAB — TROPONIN I: TROPONIN I: 0.45 ng/mL — AB (ref <0.031)

## 2016-01-04 LAB — FUNGUS CULTURE W SMEAR: FUNGAL SMEAR: NONE SEEN

## 2016-01-04 LAB — MRSA PCR SCREENING: MRSA by PCR: NEGATIVE

## 2016-01-04 MED ORDER — DEXTROSE 50 % IV SOLN
25.0000 mL | Freq: Once | INTRAVENOUS | Status: AC
  Administered 2016-01-04: 25 mL via INTRAVENOUS

## 2016-01-04 MED ORDER — SODIUM CHLORIDE 0.9 % IV BOLUS (SEPSIS)
500.0000 mL | Freq: Once | INTRAVENOUS | Status: AC
  Administered 2016-01-04: 500 mL via INTRAVENOUS

## 2016-01-04 MED ORDER — SODIUM CHLORIDE 0.9 % IV BOLUS (SEPSIS)
250.0000 mL | Freq: Once | INTRAVENOUS | Status: AC
  Administered 2016-01-04: 250 mL via INTRAVENOUS

## 2016-01-04 MED ORDER — DEXTROSE 50 % IV SOLN
50.0000 mL | Freq: Once | INTRAVENOUS | Status: AC
  Administered 2016-01-04: 50 mL via INTRAVENOUS
  Filled 2016-01-04: qty 50

## 2016-01-04 MED ORDER — DEXTROSE 50 % IV SOLN
INTRAVENOUS | Status: AC
  Administered 2016-01-04: 50 mL via INTRAVENOUS
  Filled 2016-01-04: qty 50

## 2016-01-04 MED ORDER — TECHNETIUM TO 99M ALBUMIN AGGREGATED
3.0000 | Freq: Once | INTRAVENOUS | Status: AC | PRN
  Administered 2016-01-04: 3 via INTRAVENOUS

## 2016-01-04 MED ORDER — DEXTROSE 50 % IV SOLN
1.0000 | Freq: Once | INTRAVENOUS | Status: AC
  Administered 2016-01-04: 50 mL via INTRAVENOUS

## 2016-01-04 MED ORDER — HYDROCORTISONE NA SUCCINATE PF 100 MG IJ SOLR
100.0000 mg | Freq: Four times a day (QID) | INTRAMUSCULAR | Status: DC
  Administered 2016-01-04: 100 mg via INTRAVENOUS
  Filled 2016-01-04: qty 2

## 2016-01-04 MED ORDER — TECHNETIUM TC 99M DIETHYLENETRIAME-PENTAACETIC ACID: 31.4000 | Freq: Once | INTRAVENOUS | Status: DC | PRN

## 2016-01-04 MED ORDER — GLUCOSE 40 % PO GEL
1.0000 | Freq: Once | ORAL | Status: AC
  Administered 2016-01-04: 37.5 g via ORAL
  Filled 2016-01-04: qty 1

## 2016-01-04 MED ORDER — LABETALOL HCL 100 MG PO TABS
100.0000 mg | ORAL_TABLET | Freq: Two times a day (BID) | ORAL | Status: DC
  Filled 2016-01-04: qty 1

## 2016-01-04 MED ORDER — SODIUM BICARBONATE 8.4 % IV SOLN
INTRAVENOUS | Status: DC
  Administered 2016-01-04: 08:00:00 via INTRAVENOUS
  Filled 2016-01-04: qty 150

## 2016-01-04 MED ORDER — DEXTROSE 50 % IV SOLN
INTRAVENOUS | Status: AC
  Filled 2016-01-04: qty 50

## 2016-01-04 MED ORDER — DEXTROSE 50 % IV SOLN
25.0000 mL | Freq: Once | INTRAVENOUS | Status: AC
  Administered 2016-01-04: 25 mL via INTRAVENOUS
  Filled 2016-01-04: qty 50

## 2016-01-04 MED ORDER — DICLOFENAC SODIUM 1 % TD GEL
2.0000 g | Freq: Two times a day (BID) | TRANSDERMAL | Status: DC
  Filled 2016-01-04 (×2): qty 100

## 2016-01-04 MED ORDER — MONTELUKAST SODIUM 5 MG PO CHEW
5.0000 mg | CHEWABLE_TABLET | Freq: Every day | ORAL | Status: DC
  Administered 2016-01-04: 5 mg via ORAL
  Filled 2016-01-04 (×2): qty 1

## 2016-01-04 MED ORDER — SODIUM POLYSTYRENE SULFONATE 15 GM/60ML PO SUSP
30.0000 g | Freq: Once | ORAL | Status: AC
  Administered 2016-01-04: 30 g via ORAL
  Filled 2016-01-04: qty 120

## 2016-01-04 MED ORDER — SODIUM CHLORIDE 0.9 % IV BOLUS (SEPSIS): 250.0000 mL | Freq: Once | INTRAVENOUS | Status: DC

## 2016-01-04 NOTE — Progress Notes (Signed)
VASCULAR LAB PRELIMINARY  PRELIMINARY  PRELIMINARY  PRELIMINARY  Bilateral lower extremity venous duplex completed.    Preliminary report:  Bilateral - No obvious evidence of DVT or Baker's cyst. Right - Positive for a superficial thrombus of the proximal greater saphenous vein extending into the level of the saphenofemoral junction. Left - No evidence of a superficial thrombosis. Moderate interstitial fluid noted bilaterally.  Timothy [name], RVS 01/04/2016, 9:13 AM

## 2016-01-04 NOTE — ED Notes (Signed)
RN will draw from port

## 2016-01-04 NOTE — Progress Notes (Signed)
CRITICAL VALUE ALERT  Critical value received:  Lactic Acid: 2.6  Date of notification:  01/04/2016  Time of notification:  [id number]  Critical value read back:Yes.    Nurse who received alert:  Leanna Sato, RN  MD notified (1st page):  [place] Pink  Time of first page:  1452   Time MD responded:  1454

## 2016-01-04 NOTE — ED Notes (Signed)
Patient transported to [place]. resp therapy accompanying due to bipap

## 2016-01-04 NOTE — ED Notes (Signed)
Patient is in Nuclear Med. At this time.

## 2016-01-04 NOTE — Progress Notes (Signed)
Timothy [name]   DOB:1946/08/16   DG#:[id number]   RJJ#:[id number]  Subjective: Timothy [name] had "a rough night," but he seems more calm this AM. He had no BMs past 24 h, then two this AM, both loose. He has "a little soreness" in his abdomen--he had none yesterday. He did very well with BiPap and was able to rest some. This is not focal. No cough, phlegm production or pleurisy. No CP/P. Wife Timothy [name] in room.   Objective:  Middle aged White man examined in bed Filed Vitals:   01/04/16 0530 01/04/16 0644  BP: 93/65 88/62  Pulse: 82 80  Temp:    Resp: 24 22    There is no weight on file to calculate BMI. No intake or output data in the 24 hours ending 01/04/16 0824 Current BP 117/75, sat 98% on 2L [place], AF   Sclerae unicteric  Oropharynx clear  Lungs no wheezes or rhonchi--auscultated anterolaterally  Heart regular rate and rhythm  Abdomen distended, don't hear BS, minimal discomfort to deep palpation, no rebound  Neuro nonfocal, A&O x3   CBG (last 3)   Recent Labs  01/04/16 0202 01/04/16 0447 01/04/16 0654  GLUCAP 123* 121* 102*     Labs:  Lab Results  Component Value Date   WBC 1.8* 01/04/2016   HGB 12.7* 01/04/2016   HCT 40.3 01/04/2016   MCV 94.4 01/04/2016   PLT 38* 01/04/2016   NEUTROABS 2.0 01/03/2016    '@LASTCHEMISTRY'$ @  Urine Studies No results for input(s): UHGB, CRYS in the last 72 hours.  Invalid input(s): UACOL, UAPR, USPG, UPH, UTP, UGL, UKET, UBIL, UNIT, UROB, ULEU, UEPI, UWBC, URBC, UBAC, CAST, UCOM, BILUA  Basic Metabolic Panel:  Recent Labs Lab 01/03/16 1142 01/04/16 0439  NA 146* 139  K 4.1 5.3*  CL 114* 111  CO2 19* 16*  GLUCOSE 49* 129*  BUN 62* 69*  CREATININE 1.13 1.61*  CALCIUM 7.6* 7.0*   GFR Estimated Creatinine Clearance: 50 mL/min (by C-G formula based on Cr of 1.61). Liver Function Tests:  Recent Labs Lab 01/03/16 1142 01/04/16 0439  AST 139* 121*  ALT 200* 185*  ALKPHOS 300* 307*  BILITOT 2.3* 2.0*  PROT 4.8* 4.3*  ALBUMIN  2.4* 2.1*    Recent Labs Lab 01/03/16 1636  LIPASE 33   No results for input(s): AMMONIA in the last 168 hours. Coagulation profile No results for input(s): INR, PROTIME in the last 168 hours.  CBC:  Recent Labs Lab 01/03/16 1142 01/04/16 0439  WBC 2.7* 1.8*  NEUTROABS 2.0  --   HGB 12.4* 12.7*  HCT 39.5 40.3  MCV 94.3 94.4  PLT 62* 38*   Cardiac Enzymes:  Recent Labs Lab 01/03/16 1626 01/03/16 2229  TROPONINI 0.16* 0.16*   BNP: Invalid input(s): POCBNP CBG:  Recent Labs Lab 01/04/16 0114 01/04/16 0137 01/04/16 0202 01/04/16 0447 01/04/16 0654  GLUCAP 35* 44* 123* 121* 102*   D-Dimer No results for input(s): DDIMER in the last 72 hours. Hgb A1c No results for input(s): HGBA1C in the last 72 hours. Lipid Profile No results for input(s): CHOL, HDL, LDLCALC, TRIG, CHOLHDL, LDLDIRECT in the last 72 hours. Thyroid function studies No results for input(s): TSH, T4TOTAL, T3FREE, THYROIDAB in the last 72 hours.  Invalid input(s): FREET3 Anemia work up No results for input(s): VITAMINB12, FOLATE, FERRITIN, TIBC, IRON, RETICCTPCT in the last 72 hours. Microbiology Recent Results (from the past 240 hour(s))  Blood culture (routine x 2)     Status: None (Preliminary result)  Collection Time: 01/03/16  5:50 PM  Result Value Ref Range Status   Specimen Description   Final    BLOOD RIGHT CHEST PORTA CATH Performed at [facility]    Special Requests BOTTLES DRAWN AEROBIC AND ANAEROBIC 10 CC EACH  Final   Culture PENDING  Incomplete   Report Status PENDING  Incomplete      Studies:  Dg Chest 2 View  01/03/2016  CLINICAL DATA:  Shortness of breath with diaphoresis and disorientation. EXAM: CHEST  2 VIEW COMPARISON:  Radiographs 12/17/2015.  CT 12/25/2015. FINDINGS: Right IJ Port-A-Cath tip extends to the SVC right atrial level. The heart size and mediastinal contours are stable. There are persistent low lung volumes with moderate size bilateral  pleural effusions and bibasilar atelectasis. No progressive airspace disease or pneumothorax identified. The bones appear unchanged. IMPRESSION: No significant change in cardiomegaly, pleural effusions and bibasilar atelectasis. No acute findings. Electronically Signed   By: Timothy [name] M.D.   On: 01/03/2016 15:29   Dg Chest Port 1 View  01/03/2016  CLINICAL DATA:  Shortness of breath.  Initial encounter. EXAM: PORTABLE CHEST 1 VIEW COMPARISON:  PA and lateral chest 01/03/2016.  CT chest 12/25/2015. FINDINGS: Lung volumes are low with basilar atelectasis. Small bilateral pleural effusions are present. Heart size is mildly enlarged. No pneumothorax. Port-A-Cath is in place. IMPRESSION: Low lung volumes with bibasilar atelectasis. Small bilateral pleural effusions. Electronically Signed   By: Timothy [name] M.D.   On: 01/03/2016 20:21   US Abdomen Limited Ruq  01/03/2016  CLINICAL DATA:  Elevated liver enzymes EXAM: US ABDOMEN LIMITED - RIGHT UPPER QUADRANT COMPARISON:  CT abdomen and pelvis February 19, 2015 FINDINGS: Gallbladder: No gallstones are apparent. The gallbladder wall is thickened with a measured wall thickness of 5 mm. The gallbladder wall does not appear edematous, and there is no pericholecystic fluid. No sonographic Murphy sign noted by sonographer. Common bile duct: Diameter: 6 mm. There is no intrahepatic or extrahepatic biliary duct dilatation. Liver: No focal lesion identified. Within normal limits in parenchymal echogenicity. There is ascites as well as a right pleural effusion. IMPRESSION: There is ascites as well as a right pleural effusion. Gallbladder wall is thickened. Gallbladder wall thickening may be seen with ascites. The possibility of a degree of acalculus cholecystitis is difficult to exclude in this circumstance. Note that there is no pericholecystic fluid or edema in the gallbladder wall. It may be prudent to consider correlation with nuclear medicine hepatobiliary  imaging study to confirm cystic duct patency. Electronically Signed   By: Timothy [name] M.D.   On: 01/03/2016 13:51    Assessment: 70 y.o.  [place] man with a history of well-differentiated lymphocytic lymphoma/ chronic lymphoid leukemia initially diagnosed in 2000, not requiring intervention until 2006; s/p allogeneic transplant March 2013, now admitted with worsening SOB, bilateral pulmonary infiltrates and acute on chronic kidney injury, but no fever, mucositis, or rash. His CLL history is as follows:  (1) fludarabine/cyclophosphamide/rituximab x5 completed May 2007.   (2) rituximab for 8 doses October 2010, with partial response   (3) Leustatin and ofatumumab weekly x8 July to September 2011 followed by maintenance ofatumumab every 2 months, with initial response but rising counts September 2012   (4) status-post unrelated donor stem-cell transplant 02/24/2012 at the [facility]  (a) conditioning regimen consisted of fludarabine + TBI at 200 cGy, followed by rituximab x27;  (b) CMV reactivation x3 (patient CMV positive, donor negative), s/p ganciclovir treatment; 3d reactivation August 2013, s/p gancyclovir,  with negative PCR mid-September 2013; last gancyclovir dose 10/06/2012 (c) Chronic GVHD: involving gut and skin, treated with steroids, tacrolimus and MMF. MMF was eventually d/c'd and tacrolimus currently at a dose of 1.'5mg'$  BID (d) atrial fibrillation: resolved on brief amiodarone regimen (e) steroid-induced myopathy: improving  (f) hypomagnesemia: improved after d/c gancyclovir, needs continuing support (g) hypogammaglobulinemia: requiring IVIG most recently 08/03/2014. (h) history of elevated triglycerides (606 on 07/14/2012)  (i) adrenal insufficiency: on prednisone and budesonide (j) pancytopenia,resolved (k) brief episode of neutropenia ([place] [address]) February 2015, accompanied by diarrhea; resolved   (5) restaging studies February-March 2015 including CT scans, flow  cytometry, and bone marrow biopsy, showed no evidence of residual chronic lymphoid leukemia. (a) repeat bone marrow biopsy 02/27/2015 showed no evidence of chronic lymphoid leukemia and also no dyspoiesis. Flow cytometry showed no B cells.  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 Status post kyphoplasty at L3-4 in June 2014. Also with evidence of rib fractures and insufficiency fractures bilaterally of the sacral alae, noted by CT in March 2015. -- Denosumab started 12/08/2013, given as prolia Q6 months which is what has been approved by his insurance, most recent dose 03/29/2015  (12) chronic back pain and hip pain controlled with OxyContin and hydrocodone/APAP.  (12) nausea: well controlled on current meds  (13) Positive c.diff, 03/08/2013, on Flagyl 500 mg TID x 20 days, then on oral vanco with Questran, showing improvement; positive when repeated April 2014; Negative x 3 since then; repeat 12/07/2015 again negative  (14) persistently increased BUN and potassium: Improved offtacrolimus  (15) Hypertension, on labetalol, cardizem, lisinopril, and furosemide; managed by Dr. Brigitte Pulse  (16) steroid induced hyperglycemia/ DM II: managed by Dr Brigitte Pulse; was severely hypoglycemic on admission, currently on SSI   (17) hypogammaglobulinemia-- requiring intermittent supplementation, most recent dose 10/18/2015  (18) squamous cell CA in situ removed from left parietal scalp October 2014  (19)  severe SOB with pulmonary infiltrates: workup so far has included  (a) echo 12/06/2015 shows EF 45-50% and no wall motion abnormalities (b) EKG shows new (c/w 2014) left fascicular block, old RBBB, no ischemic changes (c) troponin is minimally and persistently elevated, not c/w MI (d) V/Q scan 12/07/2015 low probability (e) negative influenza panel (f) .CMV IgG positive but IgM negative, quant DNA neg/low (g) PCP screen from [place] negative (h) IgG level <400-- replaced 12/11/2015  (20) severe deconditioning  (21) readmitted 01/03/2016 with severe hypoglucemia, elevated LFTs, and respiratory compromise (but greatly improved renal function!)   Plan: Joevon's situation is complex. I have previously discussed his case w Dr Jari Sportsman at [facility] and she suggested adding serolimus to his regimen--that would be on the assumption that at least some of his lung and liver problems are related to [facility]. I have also discussed his case with the [facility] group, where he had his transplant 3+ years ago, and they thought a steroid taper off tacrolimus or serolimus could be attempted.  My feeling is that we don't have the level of experience with transplant patients needed to appropriately evaluate and treat Manraj's current problems. He is stable enough for transfer  this AM and I have discussed that option with the patient and Dr Maryland Pink. I will contact Dr Nadara Mustard at [facility] and if she is agreeable will proceed to transfer, hopefully this AM  Greatly appreciate the hospitalist and ED staff's help to this patient!   Chauncey Cruel, MD 01/04/2016  8:24 AM Medical Oncology and Hematology [facility] [address] [place], [place] [address] Tel. [phone]    Fax. [phone]

## 2016-01-04 NOTE — Discharge Summary (Addendum)
[name]  Physician Discharge Summary   Patient ID: Timothy [name] MRN: [id number] DOB/AGE: Jan 28, 1946 70 y.o.  Admit date: 01/03/2016 Discharge date: 01/04/2016  PCP: Marton Redwood, MD  DISCHARGE DIAGNOSES:  Principal Problem:   Transaminitis Active Problems:   Transplant recipient   Immunocompromised ([place])   Chronic GVHD complicating bone marrow transplantation ([place])   CLL (chronic lymphocytic leukemia) ([place])   Steroid-induced diabetes mellitus ([place])   Shortness of breath   Uncontrolled type 2 diabetes mellitus with diabetic nephropathy, with long-term current use of insulin ([place])   Hypoglycemia    PATIENT TO BE TRANSFERRED TO [place].   RECOMMENDATIONS FOR OUTPATIENT FOLLOW UP: To be determined when he is discharged from [place]: 70 year old Caucasian male with the very calm. 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 At that time he was admitted for pneumonia due to strep bovis. He underwent bronchoscopy. He was given antibiotics. He completed the course of antibiotics. He also was noted to have acute on chronic kidney disease thought to be due to Prograf. Prograf level was toxic. He was seen by nephrology at that time. Prograf was discontinued. He was also seen by pulmonology and infectious disease. He was admitted initially to the intensive care unit. He was discharged in good condition. He presented to the hospital a few days later with low blood sugars. He was found to be tachypneic with stable x-ray findings. He also was noted to have transaminitis and thrombocytopenia. He was hospitalized for further management.  Consultants: Oncology  Procedures:  Lower extremity venous  Dopplers Bilateral - No obvious evidence of DVT or Baker's cyst. Right - Positive for a superficial thrombus of the proximal greater saphenous vein extending into the level of the saphenofemoral junction. Left - No evidence of a superficial thrombosis. Moderate interstitial fluid noted bilaterally.  Antibiotics: Vancomycin and Zosyn  HOSPITAL COURSE:   Hypoglycemia in the setting of steroid induced diabetes, on long-acting insulin Blood sugars have stabilized to some extent. He continues to require a D5 infusion. HbA1c is pending. Low blood sugars are likely a result of accumulation of his long-acting insulin. Unclear how elevated liver functions are contributing to this picture. Insulin has been held. CBGs will need to be monitored closely.  Dyspnea/Possible Sepsis/Elevated troponin/Acute Respiratory failure Chest x-ray findings are similar to previous. Dyspnea could be due to metabolic derangements. Patient has been started on broad-spectrum antibiotics due to recent hospitalization and abnormal chest x-ray. Overnight, patient required BiPAP. Chest x-ray was repeated with continues to show similar findings. As a result, a VQ scan was done which was an indeterminate study. Lower extremity Doppler shows only a superficial thrombus. However, it is close to the saphenofemoral junction in the right leg. Considering his worsening thrombocytopenia and inconclusive test results we will hold off on anticoagulation as the risks of placing him on anticoagulation is much higher due to his severe thrombocytopenia. He may need repeat testing, preferably with a CT scan if his renal function improves. This was discussed with his oncologist, Dr. Jana Hakim and he agrees. Continue BiPAP for now. Continue antibiotics for now. Mild elevation in troponin is most likely due to demand ischemia. He denies any chest pain. Recent echocardiogram report from December reviewed (EF 45-50%, No WMA). Monitor clinically.  Superficial  thrombus  right lower extremity Please see discussion above.  Transaminitis He is noted to have elevated AST, ALT and bilirubin. Ultrasound suggested that Gb wall abnormalities. However, patient is asymptomatic. He does not have any right upper quadrant tenderness. Etiology for his elevated LFTs are not clear. Hepatitis panel is pending. His LFTs are somewhat better this morning. Ordered Doppler studies of his hepatic and portal vasculature which is unremarkable. Ultrasound does not reveal any focal lesions in the liver.  Thrombocytopenia Etiology remains unclear. Counts are worse today. Discussed with Dr. Jana Hakim. Avoid heparin products. No overt bleeding identified. No clear indication for transfusion quite yet.  Hypotension  Patient's blood pressure noted to be low. He is, however, asymptomatic. Blood pressure medication dose will be adjusted. Monitor closely for now. Lactic acid level noted to high. He'll be given IV fluids. Levels will need to be repeated. He will be given stress dose steroids. If blood pressure remains low, he may need pressors.  Recent pneumonia with strep bovis He recently completed course of antibiotics. See above.  History of CLL, status post transplant with chronic GVHD on immunosuppressants He is currently not on Prograf due to recent Prograf related renal toxicity. He is on prednisone, which will be continued for now. His oncologist has been following. He has discussed this patient's case with the transplant specialist at [facility]. Due to complexity of his case he recommends transfer to [facility]. He has spoken to the staff there and the patient is currently awaiting bed.  Acute on chronic kidney disease stage IV with non-anion gap metabolic acidosis and hyperkalemia His creatinine is much better than what it was during his previous hospitalization. Creatinine slightly higher today compared to yesterday. Will be started on a bicarbonate  infusion. He was given Kayexalate for hyperkalemia. Calcium has improved. Renal function remains stable. Monitor urine output.  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 He was tested for C. difficile recently and was negative.  History of depression Continue Zoloft.  History of atrial fibrillation Patient is not on anticoagulation due to history of thrombocytopenia. Chads 2 vascular score is at least 3. Rate control with Cardizem and labetalol which will be continued.  Anemia of chronic disease Continue to monitor hemoglobin. Hemoglobin remains stable  Await transfer to [facility].   PERTINENT LABS:  The results of significant diagnostics from this hospitalization (including imaging, microbiology, ancillary and laboratory) are listed below for reference.    Microbiology: Recent Results (from the past 240 hour(s))  Blood culture (routine x 2)     Status: None (Preliminary result)   Collection Time: 01/03/16  4:27 PM  Result Value Ref Range Status   Specimen Description BLOOD LEFT WRIST  Final   Special Requests BOTTLES DRAWN AEROBIC AND ANAEROBIC 5CC  Final   Culture   Final    NO GROWTH < 24 HOURS Performed at [facility]    Report Status PENDING  Incomplete  Blood culture (routine x 2)     Status: None (Preliminary result)   Collection Time: 01/03/16  5:50 PM  Result Value Ref Range Status   Specimen Description BLOOD RIGHT CHEST PORTA CATH  Final   Special Requests BOTTLES DRAWN AEROBIC AND ANAEROBIC 10 CC EACH  Final   Culture   Final    NO GROWTH < 24 HOURS Performed at [facility]    Report Status PENDING  Incomplete  MRSA PCR Screening     Status: None   Collection Time: 01/04/16  1:20 PM  Result Value Ref Range Status   MRSA by PCR NEGATIVE NEGATIVE Final    Comment:        The GeneXpert MRSA Assay (FDA approved for NASAL specimens only), is one component of a comprehensive MRSA colonization surveillance program. It is not intended  to diagnose MRSA infection nor to guide or monitor treatment for MRSA infections.      Labs: Basic Metabolic Panel:  Recent Labs Lab 01/03/16 1142 01/04/16 0439 01/04/16 1230  NA 146* 139 140  K 4.1 5.3* 4.5  CL 114* 111 112*  CO2 19* 16* 17*  GLUCOSE 49* 129* 164*  BUN 62* 69* 68*  CREATININE 1.13 1.61* 1.49*  CALCIUM 7.6* 7.0* 6.5*   Liver Function Tests:  Recent Labs Lab 01/03/16 1142 01/04/16 0439  AST 139* 121*  ALT 200* 185*  ALKPHOS 300* 307*  BILITOT 2.3* 2.0*  PROT 4.8* 4.3*  ALBUMIN 2.4* 2.1*    Recent Labs Lab 01/03/16 1636  LIPASE 33   CBC:  Recent Labs Lab 01/03/16 1142 01/04/16 0439  WBC 2.7* 1.8*  NEUTROABS 2.0  --   HGB 12.4* 12.7*  HCT 39.5 40.3  MCV 94.3 94.4  PLT 62* 38*   Cardiac Enzymes:  Recent Labs Lab 01/03/16 1626 01/03/16 2229 01/04/16 1230  TROPONINI 0.16* 0.16* 0.45*   BNP: BNP (last 3 results)  Recent Labs  03/07/15 1053 01/03/16 1626  BNP 358.3* 394.8*    CBG:  Recent Labs Lab 01/04/16 0914 01/04/16 1152 01/04/16 1324 01/04/16 1454 01/04/16 1542  GLUCAP 76 59* 80 64* 94     IMAGING STUDIES Dg Chest 2 View  01/03/2016  CLINICAL DATA:  Shortness of breath with diaphoresis and disorientation. EXAM: CHEST  2 VIEW COMPARISON:  Radiographs 12/17/2015.  CT 12/25/2015. FINDINGS: Right IJ Port-A-Cath tip extends to the SVC right atrial level. The heart size and mediastinal contours are stable. There are persistent low lung volumes with moderate size bilateral pleural effusions and bibasilar atelectasis. No progressive airspace disease or pneumothorax identified. The bones appear unchanged. IMPRESSION: No significant change in cardiomegaly, pleural effusions and bibasilar atelectasis. No acute findings. Electronically Signed   By: Richardean Sale M.D.   On: 01/03/2016 15:29   Dg Chest 2 View  12/11/2015  CLINICAL DATA:  Weakness and shortness of breath today. History of pneumonia and leukemia. EXAM:  CHEST  2 VIEW COMPARISON:  12/10/2015 FINDINGS: Right IJ Port-A-Cath unchanged. Lungs are somewhat hypoinflated and demonstrate continued airspace opacification over the right lobe and posterior lower lobes with slight interval improvement. Stable hazy opacification over the left mid upper lung. Small amount of bilateral pleural fluid posteriorly. Stable cardiomegaly. Remainder the exam is unchanged. IMPRESSION: Hypoinflation with multifocal airspace process with slight interval improvement likely multifocal pneumonia. Small amount of posterior pleural fluid. Right IJ Port-A-Cath unchanged. Electronically Signed   By: Marin Olp M.D.   On: 12/11/2015 11:51   Dg Chest 2 View  12/10/2015  CLINICAL DATA:  Shortness of breath. History of chronic lymphocytic leukemia EXAM: CHEST  2 VIEW COMPARISON:  Study obtained earlier in the day and chest CT December 06, 2015 FINDINGS: There is extensive airspace consolidation in the right lower lobe. There is more patchy infiltrate throughout much of the left upper lobe and to a lesser extent right upper lobe, stable. Compared to earlier in the day, there is new consolidation lateral left base. Heart is upper normal in size with pulmonary vascular within normal limits. Port-A-Cath tip is in the  superior vena cava. No adenopathy appreciable. Multiple compression fractures in the thoracic spine appear stable. IMPRESSION: Multifocal pneumonia with new infiltrate in the lateral left base compared to earlier in the day. Other areas of infiltrate appears stable. No change in cardiac silhouette. Electronically Signed   By: Lowella Grip III M.D.   On: 12/10/2015 20:29   Dg Chest 2 View  12/08/2015  CLINICAL DATA:  70 year old male with pneumonia and shortness of breath. Recent bone marrow transplant. EXAM: CHEST  2 VIEW COMPARISON:  12/07/2015 and prior radiograph FINDINGS: The cardiomediastinal silhouette is unchanged. A right Port-A-Cath with tip overlying the superior  cavoatrial junction again noted. Increasing right upper lung airspace disease/pneumonia is noted. Bilateral lower lung airspace disease is unchanged. No large pleural effusions or pneumothorax noted. IMPRESSION: Increasing right upper lung airspace disease/pneumonia. Unchanged bilateral lower lung airspace disease. Electronically Signed   By: Margarette Canada M.D.   On: 12/08/2015 11:39   Dg Chest 2 View  12/07/2015  CLINICAL DATA:  Shortness of breath, pre VQ scan. EXAM: CHEST  2 VIEW COMPARISON:  Chest CT 12/06/2015 FINDINGS: Focal consolidation noted in the right lung base concerning for pneumonia, this is similar to prior CT. Minimal left basilar density, also similar. Heart is mildly enlarged. No effusions. No acute bony abnormality. IMPRESSION: Stable appearance of the bilateral lower lobe airspace opacities, right greater than left concerning for pneumonia. Electronically Signed   By: Rolm Baptise M.D.   On: 12/07/2015 12:25   Ct Chest Wo Contrast  12/25/2015  CLINICAL DATA:  CLL status post bone marrow transplant complicated by chronic graft-versus-host disease. Shortness of breath. Evaluate pulmonary opacities. EXAM: CT CHEST WITHOUT CONTRAST TECHNIQUE: Multidetector CT imaging of the chest was performed following the standard protocol without IV contrast. COMPARISON:  12/17/2015 chest radiograph.  12/06/2015 chest CT. FINDINGS: Mediastinum/Nodes: Normal heart size. Trace pericardial fluid/ thickening. Left anterior descending, left circumflex and right coronary atherosclerosis. Right internal jugular MediPort terminates at the cavoatrial junction. Great vessels are normal in course and caliber. Normal visualized thyroid. Normal esophagus. No pathologically enlarged axillary, mediastinal or gross hilar lymph nodes, noting limited sensitivity for the detection of hilar adenopathy on this noncontrast study. Lungs/Pleura: No pneumothorax. There are small layering bilateral pleural effusions, right greater  than left, increased bilaterally. No pleural thickening or nodularity. The previously described patchy consolidation and ground-glass opacity in the right middle lobe, lingula and bilateral lower lobes has largely resolved. There is mild to moderate passive atelectasis in the dependent lower lobes bilaterally. There is new mild patchy ground-glass opacity and interlobular septal thickening in the medial right upper lobe and lingula. There are new patchy areas of ground-glass opacity in the anterior left upper lobe and there is a new subsolid 1.7 x 1.2 cm pulmonary nodule in the anterior left upper lobe (series 5/image 19). Upper abdomen: Small volume ascites in the perihepatic and perisplenic regions, increased. Musculoskeletal: No aggressive appearing focal osseous lesions. Healing subacute nondisplaced left posterior tenth rib fracture. Stable mild T4, T7, T10 and T11 vertebral compression fractures. Moderate degenerative changes in the thoracic spine. New mild anasarca. IMPRESSION: 1. Near complete resolution of the patchy consolidation and ground-glass opacity in the right middle lobe, lingula and bilateral lower lobes described on the 12/06/2015 chest CT study. 2. New mild patchy ground-glass opacity and interlobular septal thickening in the medial right upper lobe and lingula. New patchy areas of ground-glass opacity and new subsolid pulmonary nodule in the anterior left upper lobe. These findings are  favored represent an interval nonspecific infectious or inflammatory process. A follow-up unenhanced chest CT is recommended in 3 months. 3. Worsening third-spacing as indicated by increased small right greater than left bilateral pleural effusions, increased small volume ascites in the upper abdomen and new mild anasarca. Electronically Signed   By: Ilona Sorrel M.D.   On: 12/25/2015 15:20   Ct Chest Wo Contrast  12/06/2015  CLINICAL DATA:  Shortness of breath. Kidney failure. Hypocalcemia. Hypoglycemia.  History of leukemia, status post bone marrow transplant in 2013. EXAM: CT CHEST WITHOUT CONTRAST TECHNIQUE: Multidetector CT imaging of the chest was performed following the standard protocol without IV contrast. COMPARISON:  02/19/2015 FINDINGS: Mediastinum/Nodes: The right-sided Port-A-Cath which terminates at the superior caval/ atrial junction. Tortuous descending thoracic aorta. Mild cardiomegaly. Multivessel coronary artery atherosclerosis. No mediastinal or definite hilar adenopathy, given limitations of unenhanced CT. Lungs/Pleura: Tiny right-sided pleural effusion. Right worse than left bibasilar and dependent upper lobe airspace and ground-glass opacity with mild septal thickening. Upper abdomen: Trace perihepatic ascites. Normal imaged portions of the spleen, stomach, adrenal glands, kidneys. The gallbladder is incompletely imaged but may be distended. This is similar to on the prior exam. No calcified stone. Subtle edema in the porta hepatis, including on image 56/series 2. Musculoskeletal: Remote left-sided rib fractures. Osteopenia. Advanced thoracolumbar spondylosis. Mild superior endplate compression deformity at T4 is chronic. Interval healing of previously described T10 compression deformity. A moderate T12 compression deformity is not significantly changed. IMPRESSION: 1. Bilateral airspace and ground-glass opacities, favor infection. Given dependent distribution, aspiration could look similar. 2.  Atherosclerosis, including within the coronary arteries. 3. Cardiomegaly with small right pleural effusion. 4. Nonspecific gallbladder distension. Suggestion of edema in the porta hepatis. Correlate with upper abdominal symptoms. Consider correlation with pancreatic enzyme levels to exclude pancreatitis. Depending on symptomatology, abdominal ultrasound or CT may be informative. 5. Trace perihepatic ascites. Electronically Signed   By: Abigail Miyamoto M.D.   On: 12/06/2015 18:46   Nm Pulmonary Perf And  Vent  01/04/2016  CLINICAL DATA:  Shortness of breath. EXAM: NUCLEAR MEDICINE VENTILATION - PERFUSION LUNG SCAN TECHNIQUE: Ventilation images were obtained in multiple projections using inhaled aerosol Tc-36m DTPA. Perfusion images were obtained in multiple projections after intravenous injection of Tc-42m MAA. RADIOPHARMACEUTICALS:  [id number] millicuries [id number] DTPA aerosol inhalation and 3.0 millicuries [id number] MAA IV COMPARISON:  No prior. FINDINGS: Tiny bibasilar matched defects. Correlate with chest x-ray. This scan is indeterminate for pulmonary embolus. IMPRESSION: Tiny bibasilar matched defects. No large perfusion defect. Technically this is a a indeterminate scan. Correlation with chest x-ray suggested. Electronically Signed   By: Marcello Moores  Register   On: 01/04/2016 12:04   Nm Pulmonary Perf And Vent  12/07/2015  CLINICAL DATA:  Shortness of breath for 1 and half weeks. Lower extremity swelling. EXAM: NUCLEAR MEDICINE VENTILATION - PERFUSION LUNG SCAN TECHNIQUE: Ventilation images were obtained in multiple projections using inhaled aerosol Tc-14m DTPA. Perfusion images were obtained in multiple projections after intravenous injection of Tc-42m MAA. RADIOPHARMACEUTICALS:  31 millicurie [id number] DTPA aerosol inhalation and 0000000 millicurie [id number] MAA IV COMPARISON:  12/07/2015 FINDINGS: Ventilation: No focal ventilation defect. Central scratch set deposition of the radiopharmaceutical within the central airway is noted. Perfusion: No wedge shaped peripheral perfusion defects to suggest acute pulmonary embolism. IMPRESSION: Low probability for acute pulmonary embolus. Electronically Signed   By: Kerby Moors M.D.   On: 12/07/2015 15:38   US Renal Port  12/06/2015  CLINICAL DATA:  Acute renal failure EXAM: RENAL / URINARY TRACT  ULTRASOUND COMPLETE COMPARISON:  02/19/2015 FINDINGS: Right Kidney: Length: 11.6 cm. Echogenicity within normal limits. No mass or hydronephrosis  visualized. Left Kidney: Length: 10.9 cm. Echogenicity within normal limits. No mass or hydronephrosis visualized. Bladder: Limit assessment.  Under distended.  Grossly unremarkable. IMPRESSION: No acute finding by renal ultrasound. Electronically Signed   By: Jerilynn Mages.  Shick M.D.   On: 12/06/2015 21:13   [name] Art/ven Flow Abd Pelv Doppler  01/04/2016  CLINICAL DATA:  Transaminitis. EXAM: DUPLEX ULTRASOUND OF LIVER TECHNIQUE: Color and duplex Doppler ultrasound was performed to evaluate the hepatic in-flow and out-flow vessels. COMPARISON:  Ultrasound of January 03, 2016. FINDINGS: Portal Vein Velocities Main:  28.2 cm/sec Right:  21.3 cm/sec Left:  11.6 cm/sec Hepatopetal flow is noted in all portal branches. Hepatic Vein Velocities Right:  69.9 cm/sec Middle:  33.8 cm/sec Left:  37.9 cm/sec Hepatofugal flow is noted and hepatic veins. Hepatic Artery Velocity:  116 cm/sec Splenic Vein Velocity:  27.3 cm/sec Varices: None. Ascites: Minimal ascites is noted. There is no evidence of portal, hepatic or splenic venous thrombosis or occlusion. IMPRESSION: No evidence of portal, hepatic or splenic venous thrombosis or occlusion. Electronically Signed   By: Marijo Conception, M.D.   On: 01/04/2016 16:00   Dg Chest Port 1 View  01/03/2016  CLINICAL DATA:  Shortness of breath.  Initial encounter. EXAM: PORTABLE CHEST 1 VIEW COMPARISON:  PA and lateral chest 01/03/2016.  CT chest 12/25/2015. FINDINGS: Lung volumes are low with basilar atelectasis. Small bilateral pleural effusions are present. Heart size is mildly enlarged. No pneumothorax. Port-A-Cath is in place. IMPRESSION: Low lung volumes with bibasilar atelectasis. Small bilateral pleural effusions. Electronically Signed   By: Inge Rise M.D.   On: 01/03/2016 20:21   Dg Chest Port 1 View  12/17/2015  CLINICAL DATA:  Acute respiratory failure EXAM: PORTABLE CHEST 1 VIEW COMPARISON:  12/16/2015 and 12/ 23/16 FINDINGS: Cardiomediastinal silhouette is stable. There is  poor inspiration with mild basilar atelectasis. No segmental infiltrate. Right IJ Port-A-Cath is unchanged in position. Residual streaky mild edema or pneumonitis in lingula. Minimal central vascular congestion without global pulmonary edema IMPRESSION: There is poor inspiration with mild basilar atelectasis. No segmental infiltrate. Right IJ Port-A-Cath is unchanged in position. Residual streaky mild edema or pneumonitis in lingula. Electronically Signed   By: Lahoma Crocker M.D.   On: 12/17/2015 09:47   Dg Chest Port 1 View  12/16/2015  CLINICAL DATA:  Acute respiratory failure EXAM: PORTABLE CHEST 1 VIEW COMPARISON:  12/14/2015 FINDINGS: Right chest wall port a catheter is noted with tip in the cavoatrial junction. Moderate cardiac enlargement. Decreased lung volumes. Pulmonary edema pattern is stable to improved in the interval. IMPRESSION: 1. Stable to improved appearance of pulmonary edema pattern. Electronically Signed   By: Kerby Moors M.D.   On: 12/16/2015 09:22   Dg Chest Port 1 View  12/14/2015  CLINICAL DATA:  Respiratory failure. EXAM: PORTABLE CHEST - 1 VIEW COMPARISON:  One-view chest x-ray 12/13/2015. FINDINGS: The heart size is normal. The lung volumes remain low. A diffuse interstitial pattern is slightly increased compared to the prior study. Bibasilar airspace disease is evident. A right IJ Port-A-Cath is stable. IMPRESSION: Slight increase in diffuse interstitial and airspace pattern compatible with edema and pneumonia. Electronically Signed   By: San Morelle M.D.   On: 12/14/2015 07:31   Dg Chest Port 1 View  12/13/2015  CLINICAL DATA:  70 year old male with shortness of breath, respiratory failure, pneumonia. Initial encounter. EXAM: PORTABLE  CHEST 1 VIEW COMPARISON:  12/12/2015 and earlier. FINDINGS: Portable AP semi upright view at 0440 hours. Stable right chest porta cath. Stable lung volumes. Stable cardiac size and mediastinal contours. No pneumothorax or pleural  effusion. Coarse and patchy bilateral perihilar opacity is stable since yesterday, mildly regressed in the right lower lung since 12/11/2015. No areas of worsening ventilation. IMPRESSION: Bilateral multifocal pneumonia stable since yesterday. Right lower lung ventilation mildly improved since 12/11/2015. Electronically Signed   By: Genevie Ann M.D.   On: 12/13/2015 07:15   Dg Chest Port 1 View  12/12/2015  CLINICAL DATA:  Shortness of breath. Weakness. History of pneumonia and the [place]. EXAM: PORTABLE CHEST 1 VIEW COMPARISON:  12/11/2015 and previous FINDINGS: Pulmonary infiltrate in the right lower lobe is improving. However, there is worsened interstitial density throughout the lungs in general, particularly the upper lobes. This could go along with developing edema or spreading of the pneumonia. No visible effusion. Port-A-Cath on the right is unchanged. IMPRESSION: Continued improvement of the right lower lobe infiltrate. Worsening of generalized interstitial lung density, upper lobe predominant. Differential diagnosis is developing interstitial edema/ fluid overload versus generalized worsening of pneumonia. Electronically Signed   By: Nelson Chimes M.D.   On: 12/12/2015 08:20   Dg Chest Port 1 View  12/10/2015  CLINICAL DATA:  70 year old male post bronchoscopy right middle lobe. Subsequent encounter. EXAM: PORTABLE CHEST 1 VIEW COMPARISON:  12/08/2015. FINDINGS: Curvilinear structure right lung apex probably pleural reflection associated with rib rather than tiny pneumothorax. Attention to this on follow up. Asymmetric airspace disease greater on the right. When compared to the most recent chest x-ray, decrease in degree of consolidation right lung apex and slight increase in left perihilar consolidation. Pulmonary vascular congestion. Right MediPort catheter tip mid superior vena cava level. Heart size top-normal to slightly enlarged. IMPRESSION: Curvilinear structure right lung apex probably pleural  reflection associated with rib rather than tiny pneumothorax. Attention to this on follow up. Asymmetric airspace disease greater on the right. When compared to the most recent chest x-ray, decrease in degree of consolidation right lung apex and slight increase in left perihilar consolidation. Pulmonary vascular congestion. Electronically Signed   By: Genia Del M.D.   On: 12/10/2015 10:26   US Abdomen Limited Ruq  01/03/2016  CLINICAL DATA:  Elevated liver enzymes EXAM: US ABDOMEN LIMITED - RIGHT UPPER QUADRANT COMPARISON:  CT abdomen and pelvis February 19, 2015 FINDINGS: Gallbladder: No gallstones are apparent. The gallbladder wall is thickened with a measured wall thickness of 5 mm. The gallbladder wall does not appear edematous, and there is no pericholecystic fluid. No sonographic Murphy sign noted by sonographer. Common bile duct: Diameter: 6 mm. There is no intrahepatic or extrahepatic biliary duct dilatation. Liver: No focal lesion identified. Within normal limits in parenchymal echogenicity. There is ascites as well as a right pleural effusion. IMPRESSION: There is ascites as well as a right pleural effusion. Gallbladder wall is thickened. Gallbladder wall thickening may be seen with ascites. The possibility of a degree of acalculus cholecystitis is difficult to exclude in this circumstance. Note that there is no pericholecystic fluid or edema in the gallbladder wall. It may be prudent to consider correlation with nuclear medicine hepatobiliary imaging study to confirm cystic duct patency. Electronically Signed   By: Lowella Grip III M.D.   On: 01/03/2016 13:51   Dg C-arm Bronchoscopy  12/10/2015  CLINICAL DATA:  C-ARM BRONCHOSCOPY Fluoroscopy was utilized by the requesting physician.  No radiographic interpretation.  DISPOSITION: [facility]   ALLERGIES:  Allergies  Allergen Reactions  . Benadryl [Diphenhydramine Hcl]     "Restless leg syndrome"    Current  Inpatient Medications:  Scheduled: . albuterol  2.5 mg Nebulization Q6H  . calcitRIOL  0.25 mcg Oral Daily  . cholestyramine  4 g Oral BID  . diclofenac sodium  2 g Topical BID  . feeding supplement (GLUCERNA SHAKE)  237 mL Oral BID BM  . hydrocortisone sod succinate (SOLU-CORTEF) inj  100 mg Intravenous Q6H  . montelukast  5 mg Oral QHS  . piperacillin-tazobactam (ZOSYN)  IV  3.375 g Intravenous Q8H  . sertraline  50 mg Oral q morning - 10a  . sodium bicarbonate  650 mg Oral BID  . sodium chloride  250 mL Intravenous Once  . sodium chloride  500 mL Intravenous Once  . sodium chloride  3 mL Intravenous Q12H  . sucralfate  1 g Oral TID AC & HS  . tiotropium  18 mcg Inhalation Daily  . vancomycin  1,250 mg Intravenous Q24H   Continuous: .  sodium bicarbonate  infusion 1000 mL 75 mL/hr at 01/04/16 0801   [id number], loperamide, ondansetron **OR** ondansetron (ZOFRAN) IV, oxyCODONE, technetium TC 90M diethylenetriame-pentaacetic acid  TOTAL DISCHARGE TIME: 35 mins  [facility] Pager [phone]  01/04/2016, 5:00 PM

## 2016-01-04 NOTE — Progress Notes (Signed)
[name] PROGRESS NOTE  CHANTRY TORCHIO [id number] DOB: 1946-04-01 DOA: 01/03/2016  PCP: Marton Redwood, MD  Brief HPI: 70 year old Caucasian male with the very calm. 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 At that time he was admitted for pneumonia due to strep bovis. He underwent bronchoscopy. He was given antibiotics. He completed the course of antibiotics. He also was noted to have acute on chronic kidney disease thought to be due to Prograf. Prograf level was toxic. He was seen by nephrology at that time. Prograf was discontinued. He was also seen by pulmonology and infectious disease. He was admitted initially to the intensive care unit. He was discharged in good condition. He presented to the hospital a few days later with low blood sugars. He was found to be tachypneic with stable x-ray findings. He also was noted to have transaminitis and thrombocytopenia. He was hospitalized for further management.  Past medical history:  Past Medical History  Diagnosis Date  . 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 Diverticular disease   . Hyperlipidemia   . Obesity   . Hypertension   . Hiatal hernia   . CMV (cytomegalovirus) antibody positive     pre-transplant, with seroconversion x2 pst-transplant  . Right bundle branch block     pre-transplant  . CKD (chronic kidney disease) stage 2, GFR 60-89 ml/min   . Pancytopenia ([place])   . Atrial fibrillation ([place])     post-transplant  . Myopathy   . 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 Chronic GVHD complicating bone marrow transplantation ([place]) 12/05/2012  . Diarrhea in adult patient 12/05/2012    Due to active GVHD  . Rash of face 12/05/2012    Due to GVHD  . Hypomagnesemia 01/26/2013  . Left hip  pain 12/01/2013  . Steroid-induced diabetes ([place])     novalog  . Leukemia, chronic lymphoid ([place])   . CLL (chronic lymphocytic leukemia) ([place]) 12/05/2012    Dx 07/1999; started Rx 12/06  AlloBMT 3/13    Consultants: Oncology  Procedures:  Lower extremity venous Dopplers Bilateral - No obvious evidence of DVT or Baker's cyst. Right - Positive for a superficial thrombus of the proximal greater saphenous vein extending into the level of the saphenofemoral junction. Left - No evidence of a superficial thrombosis. Moderate interstitial fluid noted bilaterally.  Antibiotics: Vancomycin and Zosyn  Subjective: Patient was placed on BiPAP yesterday evening. He feels somewhat better this morning. His wife is at the bedside. Denies any chest pain. Has had multiple loose stools, which is usual for him. Minimal abdominal discomfort.  Objective: Vital Signs  Filed Vitals:   01/04/16 1000 01/04/16 1020 01/04/16 1151 01/04/16 1200  BP: 133/86 93/70 100/69 97/66  Pulse: 103 84  84  Temp:      TempSrc:      Resp: 32 20 23 21   SpO2: 100% 99%  98%   No intake or output data in the 24 hours ending 01/04/16 1228 There were no vitals filed for this visit.  General appearance: alert, cooperative, appears stated age, moderately obese and Tachypneic Resp: Patient is on BiPAP. Air entry is diminished at the bases. No obvious crackles or wheezing. No rhonchi. Cardio: regular rate and rhythm, S1, S2 normal, no murmur, click, rub or gallop GI: soft, non-tender;  bowel sounds normal; no masses,  no organomegaly Extremities: 1-2+ pitting edema bilateral lower extremities Neurologic: Awake and alert. No focal neurological deficits.  Lab Results:  Basic Metabolic Panel:  Recent Labs Lab 01/03/16 1142 01/04/16 0439  NA 146* 139  K 4.1 5.3*  CL 114* 111  CO2 19* 16*  GLUCOSE 49* 129*  BUN 62* 69*  CREATININE 1.13 1.61*  CALCIUM 7.6* 7.0*   Liver Function Tests:  Recent Labs Lab 01/03/16 1142  01/04/16 0439  AST 139* 121*  ALT 200* 185*  ALKPHOS 300* 307*  BILITOT 2.3* 2.0*  PROT 4.8* 4.3*  ALBUMIN 2.4* 2.1*    Recent Labs Lab 01/03/16 1636  LIPASE 33   CBC:  Recent Labs Lab 01/03/16 1142 01/04/16 0439  WBC 2.7* 1.8*  NEUTROABS 2.0  --   HGB 12.4* 12.7*  HCT 39.5 40.3  MCV 94.3 94.4  PLT 62* 38*   Cardiac Enzymes:  Recent Labs Lab 01/03/16 1626 01/03/16 2229  TROPONINI 0.16* 0.16*   BNP (last 3 results)  Recent Labs  03/07/15 1053 01/03/16 1626  BNP 358.3* 394.8*    CBG:  Recent Labs Lab 01/04/16 0202 01/04/16 0447 01/04/16 0654 01/04/16 0914 01/04/16 1152  GLUCAP 123* 121* 102* 76 59*    Recent Results (from the past 240 hour(s))  Blood culture (routine x 2)     Status: None (Preliminary result)   Collection Time: 01/03/16  5:50 PM  Result Value Ref Range Status   Specimen Description   Final    BLOOD RIGHT CHEST PORTA CATH Performed at [facility]    Special Requests BOTTLES DRAWN AEROBIC AND ANAEROBIC 10 CC EACH  Final   Culture PENDING  Incomplete   Report Status PENDING  Incomplete      Studies/Results: Dg Chest 2 View  01/03/2016  CLINICAL DATA:  Shortness of breath with diaphoresis and disorientation. EXAM: CHEST  2 VIEW COMPARISON:  Radiographs 12/17/2015.  CT 12/25/2015. FINDINGS: Right IJ Port-A-Cath tip extends to the SVC right atrial level. The heart size and mediastinal contours are stable. There are persistent low lung volumes with moderate size bilateral pleural effusions and bibasilar atelectasis. No progressive airspace disease or pneumothorax identified. The bones appear unchanged. IMPRESSION: No significant change in cardiomegaly, pleural effusions and bibasilar atelectasis. No acute findings. Electronically Signed   By: Richardean Sale M.D.   On: 01/03/2016 15:29   Nm Pulmonary Perf And Vent  01/04/2016  CLINICAL DATA:  Shortness of breath. EXAM: NUCLEAR MEDICINE VENTILATION - PERFUSION LUNG SCAN  TECHNIQUE: Ventilation images were obtained in multiple projections using inhaled aerosol Tc-74m DTPA. Perfusion images were obtained in multiple projections after intravenous injection of Tc-85m MAA. RADIOPHARMACEUTICALS:  [id number] millicuries [id number] DTPA aerosol inhalation and 3.0 millicuries [id number] MAA IV COMPARISON:  No prior. FINDINGS: Tiny bibasilar matched defects. Correlate with chest x-ray. This scan is indeterminate for pulmonary embolus. IMPRESSION: Tiny bibasilar matched defects. No large perfusion defect. Technically this is a a indeterminate scan. Correlation with chest x-ray suggested. Electronically Signed   By: Hodges   On: 01/04/2016 12:04   Dg Chest Port 1 View  01/03/2016  CLINICAL DATA:  Shortness of breath.  Initial encounter. EXAM: PORTABLE CHEST 1 VIEW COMPARISON:  PA and lateral chest 01/03/2016.  CT chest 12/25/2015. FINDINGS: Lung volumes are low with basilar atelectasis. Small bilateral pleural effusions are present. Heart size is mildly enlarged. No pneumothorax. Port-A-Cath is in place. IMPRESSION: Low lung volumes with bibasilar atelectasis. Small bilateral pleural effusions.  Electronically Signed   By: Inge Rise M.D.   On: 01/03/2016 20:21   US Abdomen Limited Ruq  01/03/2016  CLINICAL DATA:  Elevated liver enzymes EXAM: US ABDOMEN LIMITED - RIGHT UPPER QUADRANT COMPARISON:  CT abdomen and pelvis February 19, 2015 FINDINGS: Gallbladder: No gallstones are apparent. The gallbladder wall is thickened with a measured wall thickness of 5 mm. The gallbladder wall does not appear edematous, and there is no pericholecystic fluid. No sonographic Murphy sign noted by sonographer. Common bile duct: Diameter: 6 mm. There is no intrahepatic or extrahepatic biliary duct dilatation. Liver: No focal lesion identified. Within normal limits in parenchymal echogenicity. There is ascites as well as a right pleural effusion. IMPRESSION: There is ascites as well as a  right pleural effusion. Gallbladder wall is thickened. Gallbladder wall thickening may be seen with ascites. The possibility of a degree of acalculus cholecystitis is difficult to exclude in this circumstance. Note that there is no pericholecystic fluid or edema in the gallbladder wall. It may be prudent to consider correlation with nuclear medicine hepatobiliary imaging study to confirm cystic duct patency. Electronically Signed   By: Lowella Grip III M.D.   On: 01/03/2016 13:51    Medications:  Scheduled: . albuterol  2.5 mg Nebulization Q6H  . calcitRIOL  0.25 mcg Oral Daily  . cholestyramine  4 g Oral BID  . dextrose  50 mL Intravenous Once  . diclofenac sodium  2 g Topical BID  . diltiazem  240 mg Oral Daily  . feeding supplement (GLUCERNA SHAKE)  237 mL Oral BID BM  . labetalol  300 mg Oral BID  . montelukast  5 mg Oral QHS  . predniSONE  50 mg Oral Q breakfast  . sertraline  50 mg Oral q morning - 10a  . sodium bicarbonate  650 mg Oral BID  . sodium chloride  3 mL Intravenous Q12H  . sucralfate  1 g Oral TID AC & HS  . tiotropium  18 mcg Inhalation Daily   Continuous: . piperacillin-tazobactam (ZOSYN)  IV Stopped (01/04/16 0553)  .  sodium bicarbonate  infusion 1000 mL 75 mL/hr at 01/04/16 0801  . sodium chloride    . vancomycin     [id number], loperamide, ondansetron **OR** ondansetron (ZOFRAN) IV, oxyCODONE, technetium TC 57M diethylenetriame-pentaacetic acid  Assessment/Plan:  Principal Problem:   Transaminitis Active Problems:   Transplant recipient   Immunocompromised ([place])   Chronic GVHD complicating bone marrow transplantation (H[facility])   CLL (chronic lymphocytic leukemia) (H[facility])   Steroid-induced diabetes mellitus (H[facility])   Shortness of breath   Uncontrolled type 2 diabetes mellitus with diabetic nephropathy, with long-term current use of insulin (H[facility])   Hypoglycemia    Hypoglycemia in the setting of steroid induced diabetes, on long-acting insulin Blood  sugars have stabilized to some extent. He continues to require a D5 infusion. HbA1c is pending. Low blood sugars are likely a result of accumulation of his long-acting insulin. Unclear how elevated liver functions are contributing to this picture. Insulin has been held. CBGs will be monitored closely.  Dyspnea/Possible Sepsis/Elevated troponin/Acute Respiratory failure Chest x-ray findings are similar to previous. Dyspnea could be due to metabolic derangements. Patient has been started on broad-spectrum antibiotics due to recent hospitalization and abnormal chest x-ray. Overnight, patient required BiPAP. Chest x-ray was repeated with continues to show similar findings. As a result, a VQ scan was done which was an indeterminate study. Lower extremity Doppler shows only a superficial thrombus. However, it is close to  the saphenofemoral junction in the right leg. Considering his thrombocytopenia and inconclusive test results we will hold off on anticoagulation as the risks of placing him on anticoagulation is much higher due to his severe thrombocytopenia. He may need repeat testing, preferably with a CT scan if his renal function improves. This was discussed with his oncologist, Dr. Jana Hakim and he agrees. Continue BiPAP for now. Continue antibiotics for now. Mild elevation in troponin is most likely due to demand ischemia. He denies any chest pain. Recent echocardiogram report from December reviewed (EF 45-50%, No WMA). Monitor clinically.  Superficial thrombus right lower extremity Please see discussion above.  Transaminitis He is noted to have elevated AST, ALT and bilirubin. Ultrasound suggested that Gb wall abnormalities. However, patient is asymptomatic. He does not have any right upper quadrant tenderness. Etiology for his elevated LFTs are not clear. Hepatitis panel is pending. His LFTs are somewhat better this morning. Ordered Doppler studies of his hepatic and portal vasculature which is pending.  Ultrasound does not reveal any focal lesions in the liver.  Thrombocytopenia Etiology remains unclear. Discussed with Dr. Jana Hakim. Avoid heparin products. No overt bleeding identified. No clear indication for transfusion quite yet.  Hypotension  Patient's blood pressure noted to be low. He is, however, asymptomatic. Blood pressure medication dose will be adjusted. Monitor closely for now. Lactic acid level noted to high. He'll be given IV fluids. Levels will be repeated.  Recent pneumonia with strep bovis He recently completed course of antibiotics. See above.  History of CLL, status post transplant with chronic GVHD on immunosuppressants He is currently not on Prograf due to recent Prograf related renal toxicity. He is on prednisone, which will be continued for now. His oncologist has been following. He has discussed this patient's case with the transplant specialist at [facility]. Due to complexity of his case he recommends transfer to [facility]. He has spoken to the staff there and the patient is currently awaiting bed.  Acute on chronic kidney disease stage IV with non-anion gap metabolic acidosis and hyperkalemia His creatinine is much better than what it was during his previous hospitalization. Creatinine slightly higher today compared to yesterday. Will be started on a bicarbonate infusion. Repeat basic metabolic panel later today. He'll be given Kayexalate for hyperkalemia. If his renal function continues to get worse and if he still in this facility tomorrow, we will consult nephrology. Monitor urine output.  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 He was tested for C. difficile recently and was negative.  History of depression Continue Zoloft.  History of atrial fibrillation Patient is not on anticoagulation due to history of thrombocytopenia. Chads 2 vascular score is at least 3. Rate control with Cardizem and labetalol which will be continued.  Anemia of  chronic disease Continue to monitor hemoglobin. Hemoglobin remains stable  DVT Prophylaxis: TEDS    Code Status: Full code  Family Communication: Discussed with the patient and his wife  Disposition Plan: Await transfer to [facility] as arranged by Dr. Jana Hakim.    LOS: 1 day   [place] [facility] Pager [phone] 01/04/2016, 12:28 PM  If 7PM-7AM, please contact night-coverage at [email or web address], password [facility]

## 2016-01-04 NOTE — ED Notes (Signed)
Patient has had 4 incontinent stools-lag amounts, brown in color.

## 2016-01-04 NOTE — ED Notes (Signed)
RN to draw from pt port

## 2016-01-05 LAB — HEMOGLOBIN A1C
Hgb A1c MFr Bld: 6.4 % — ABNORMAL HIGH (ref 4.8–5.6)
MEAN PLASMA GLUCOSE: 137 mg/dL

## 2016-01-05 LAB — HEPATITIS PANEL, ACUTE
HCV Ab: <0.1 s/co ratio (ref 0.0–0.9)
HEP A IGM: NEGATIVE
HEP B C IGM: NEGATIVE
Hepatitis B Surface Ag: NEGATIVE

## 2016-01-06 ENCOUNTER — Other Ambulatory Visit: Payer: Self-pay | Admitting: Oncology

## 2016-01-08 ENCOUNTER — Telehealth: Payer: Self-pay | Admitting: Oncology

## 2016-01-08 LAB — CULTURE, BLOOD (ROUTINE X 2)
CULTURE: NO GROWTH
CULTURE: NO GROWTH

## 2016-01-08 NOTE — Telephone Encounter (Signed)
Per 1/15 pof cx all appointments and schedule new appointment for lab/GM 1st week in March - PM appointment. Left message for patient re changes with new date/time for 3/8. Schedule mailed.

## 2016-01-09 LAB — LEGIONELLA CULTURE

## 2016-01-09 LAB — CMV CULTURE CMVC: Culture: NEGATIVE

## 2016-01-10 ENCOUNTER — Other Ambulatory Visit: Payer: Self-pay | Admitting: Oncology

## 2016-01-10 ENCOUNTER — Other Ambulatory Visit: Payer: Self-pay | Admitting: Nurse Practitioner

## 2016-01-10 ENCOUNTER — Other Ambulatory Visit: Payer: Self-pay

## 2016-01-10 ENCOUNTER — Ambulatory Visit: Payer: Self-pay

## 2016-01-11 ENCOUNTER — Inpatient Hospital Stay (HOSPITAL_COMMUNITY): Admit: 2016-01-11 | Payer: Self-pay

## 2016-01-11 LAB — GLUCOSE, CAPILLARY: Glucose-Capillary: 101 mg/dL — ABNORMAL HIGH (ref 65–99)

## 2016-01-11 NOTE — Progress Notes (Signed)
Utilization review completed.  

## 2016-01-22 LAB — AFB CULTURE WITH SMEAR (NOT AT ARMC): ACID FAST SMEAR: NONE SEEN

## 2016-01-23 DEATH — deceased

## 2016-01-24 ENCOUNTER — Other Ambulatory Visit: Payer: Self-pay

## 2016-01-24 ENCOUNTER — Inpatient Hospital Stay: Payer: Self-pay | Admitting: Pulmonary Disease

## 2016-01-24 ENCOUNTER — Ambulatory Visit: Payer: Self-pay

## 2016-01-30 ENCOUNTER — Telehealth: Payer: Self-pay

## 2016-01-30 NOTE — Telephone Encounter (Signed)
Sherry from [facility] in [place] called. She was informed of pt's date of death. The phone and fax number for medical records was given to [facility]

## 2016-02-07 ENCOUNTER — Ambulatory Visit: Payer: Self-pay

## 2016-02-07 ENCOUNTER — Other Ambulatory Visit: Payer: Self-pay

## 2016-02-20 ENCOUNTER — Other Ambulatory Visit: Payer: Self-pay

## 2016-02-20 ENCOUNTER — Ambulatory Visit: Payer: Self-pay

## 2016-02-20 ENCOUNTER — Ambulatory Visit: Payer: Self-pay | Admitting: Nurse Practitioner

## 2016-02-21 ENCOUNTER — Ambulatory Visit: Payer: Self-pay | Admitting: Nurse Practitioner

## 2016-02-21 ENCOUNTER — Other Ambulatory Visit: Payer: Self-pay

## 2016-02-21 ENCOUNTER — Ambulatory Visit: Payer: Self-pay

## 2016-02-27 ENCOUNTER — Ambulatory Visit: Payer: Self-pay | Admitting: Oncology

## 2016-02-27 ENCOUNTER — Other Ambulatory Visit: Payer: Self-pay

## 2016-05-31 ENCOUNTER — Other Ambulatory Visit: Payer: Self-pay | Admitting: Nurse Practitioner
